# Patient Record
Sex: Female | Born: 1945 | Race: White | Hispanic: No | Marital: Married | Smoking: Never smoker
Health system: Southern US, Community
[De-identification: ages and names within clinical notes are randomized; demographics above are authoritative.]

## PROBLEM LIST (undated history)

## (undated) DIAGNOSIS — I10 Essential (primary) hypertension: Secondary | ICD-10-CM

## (undated) DIAGNOSIS — M4800 Spinal stenosis, site unspecified: Secondary | ICD-10-CM

## (undated) DIAGNOSIS — F329 Major depressive disorder, single episode, unspecified: Secondary | ICD-10-CM

## (undated) DIAGNOSIS — F32A Depression, unspecified: Secondary | ICD-10-CM

## (undated) DIAGNOSIS — R441 Visual hallucinations: Secondary | ICD-10-CM

## (undated) DIAGNOSIS — N390 Urinary tract infection, site not specified: Secondary | ICD-10-CM

## (undated) DIAGNOSIS — G8929 Other chronic pain: Secondary | ICD-10-CM

## (undated) DIAGNOSIS — I5030 Unspecified diastolic (congestive) heart failure: Secondary | ICD-10-CM

## (undated) DIAGNOSIS — E039 Hypothyroidism, unspecified: Secondary | ICD-10-CM

## (undated) DIAGNOSIS — E785 Hyperlipidemia, unspecified: Secondary | ICD-10-CM

## (undated) HISTORY — PX: ABDOMINAL HYSTERECTOMY: SHX81

## (undated) HISTORY — PX: BACK SURGERY: SHX140

## (undated) HISTORY — PX: HEMORROIDECTOMY: SUR656

---

## 1993-09-01 ENCOUNTER — Encounter: Payer: Self-pay | Admitting: Family Medicine

## 1993-09-01 LAB — CONVERTED CEMR LAB: Pap Smear: NORMAL

## 1994-11-13 ENCOUNTER — Encounter: Payer: Self-pay | Admitting: Family Medicine

## 1994-11-13 LAB — CONVERTED CEMR LAB: TSH: 5.15 microintl units/mL

## 1995-03-06 ENCOUNTER — Encounter: Payer: Self-pay | Admitting: Family Medicine

## 1995-03-06 LAB — CONVERTED CEMR LAB: TSH: 4.58 microintl units/mL

## 1995-12-07 ENCOUNTER — Encounter: Payer: Self-pay | Admitting: Family Medicine

## 1995-12-07 LAB — CONVERTED CEMR LAB: TSH: 3.94 microintl units/mL

## 1996-12-15 ENCOUNTER — Encounter: Payer: Self-pay | Admitting: Family Medicine

## 1996-12-15 LAB — CONVERTED CEMR LAB: TSH: 5.21 microintl units/mL

## 1997-07-05 ENCOUNTER — Encounter: Payer: Self-pay | Admitting: Family Medicine

## 1997-07-05 LAB — CONVERTED CEMR LAB: Pap Smear: NORMAL

## 1997-07-06 ENCOUNTER — Encounter: Payer: Self-pay | Admitting: Family Medicine

## 1997-07-06 LAB — CONVERTED CEMR LAB: TSH: 8.19 microintl units/mL

## 1997-08-16 ENCOUNTER — Ambulatory Visit (HOSPITAL_COMMUNITY): Admission: RE | Admit: 1997-08-16 | Discharge: 1997-08-16 | Payer: Self-pay | Admitting: Neurosurgery

## 1997-09-05 ENCOUNTER — Ambulatory Visit (HOSPITAL_COMMUNITY): Admission: RE | Admit: 1997-09-05 | Discharge: 1997-09-05 | Payer: Self-pay | Admitting: Neurosurgery

## 1997-10-05 ENCOUNTER — Encounter: Payer: Self-pay | Admitting: Family Medicine

## 1997-10-05 LAB — CONVERTED CEMR LAB: TSH: 0.62 microintl units/mL

## 1998-03-13 ENCOUNTER — Encounter: Payer: Self-pay | Admitting: Family Medicine

## 1998-03-13 LAB — CONVERTED CEMR LAB: TSH: 0.99 microintl units/mL

## 1999-06-10 ENCOUNTER — Encounter: Admission: RE | Admit: 1999-06-10 | Discharge: 1999-07-01 | Payer: Self-pay | Admitting: Family Medicine

## 2000-06-15 ENCOUNTER — Encounter: Payer: Self-pay | Admitting: Family Medicine

## 2000-06-15 LAB — CONVERTED CEMR LAB: TSH: 7.88 microintl units/mL

## 2001-10-03 ENCOUNTER — Encounter: Payer: Self-pay | Admitting: Family Medicine

## 2001-10-03 LAB — CONVERTED CEMR LAB: TSH: 2.71 microintl units/mL

## 2004-01-01 ENCOUNTER — Encounter: Payer: Self-pay | Admitting: Family Medicine

## 2004-01-01 LAB — CONVERTED CEMR LAB: TSH: 0.1 microintl units/mL

## 2004-06-04 ENCOUNTER — Ambulatory Visit: Payer: Self-pay | Admitting: Family Medicine

## 2004-06-04 LAB — CONVERTED CEMR LAB: TSH: 5.82 microintl units/mL

## 2004-06-09 ENCOUNTER — Ambulatory Visit: Payer: Self-pay | Admitting: Family Medicine

## 2004-07-01 ENCOUNTER — Ambulatory Visit: Payer: Self-pay | Admitting: Family Medicine

## 2004-08-19 ENCOUNTER — Ambulatory Visit: Payer: Self-pay | Admitting: Family Medicine

## 2004-10-02 ENCOUNTER — Ambulatory Visit: Payer: Self-pay | Admitting: Family Medicine

## 2005-02-04 ENCOUNTER — Ambulatory Visit: Payer: Self-pay | Admitting: Family Medicine

## 2006-09-22 ENCOUNTER — Ambulatory Visit: Payer: Self-pay | Admitting: Family Medicine

## 2006-09-22 DIAGNOSIS — L259 Unspecified contact dermatitis, unspecified cause: Secondary | ICD-10-CM | POA: Insufficient documentation

## 2006-09-22 DIAGNOSIS — E039 Hypothyroidism, unspecified: Secondary | ICD-10-CM | POA: Insufficient documentation

## 2006-09-22 DIAGNOSIS — E7800 Pure hypercholesterolemia, unspecified: Secondary | ICD-10-CM | POA: Insufficient documentation

## 2006-09-28 ENCOUNTER — Telehealth (INDEPENDENT_AMBULATORY_CARE_PROVIDER_SITE_OTHER): Payer: Self-pay | Admitting: *Deleted

## 2006-10-06 ENCOUNTER — Telehealth (INDEPENDENT_AMBULATORY_CARE_PROVIDER_SITE_OTHER): Payer: Self-pay | Admitting: *Deleted

## 2006-11-22 ENCOUNTER — Telehealth (INDEPENDENT_AMBULATORY_CARE_PROVIDER_SITE_OTHER): Payer: Self-pay | Admitting: *Deleted

## 2006-12-10 ENCOUNTER — Encounter: Payer: Self-pay | Admitting: Family Medicine

## 2006-12-10 DIAGNOSIS — F411 Generalized anxiety disorder: Secondary | ICD-10-CM | POA: Insufficient documentation

## 2006-12-10 DIAGNOSIS — F329 Major depressive disorder, single episode, unspecified: Secondary | ICD-10-CM | POA: Insufficient documentation

## 2006-12-10 DIAGNOSIS — I10 Essential (primary) hypertension: Secondary | ICD-10-CM | POA: Insufficient documentation

## 2006-12-10 DIAGNOSIS — E785 Hyperlipidemia, unspecified: Secondary | ICD-10-CM | POA: Insufficient documentation

## 2006-12-10 DIAGNOSIS — M1990 Unspecified osteoarthritis, unspecified site: Secondary | ICD-10-CM | POA: Insufficient documentation

## 2006-12-27 ENCOUNTER — Ambulatory Visit: Payer: Self-pay | Admitting: Family Medicine

## 2006-12-27 LAB — CONVERTED CEMR LAB
BUN: 9 mg/dL (ref 6–23)
CO2: 31 meq/L (ref 19–32)
Calcium: 9.1 mg/dL (ref 8.4–10.5)
Chloride: 109 meq/L (ref 96–112)
Creatinine, Ser: 0.7 mg/dL (ref 0.4–1.2)
Free T4: 0.7 ng/dL (ref 0.6–1.6)
GFR calc Af Amer: 110 mL/min
GFR calc non Af Amer: 91 mL/min
Glucose, Bld: 86 mg/dL (ref 70–99)
Potassium: 3.8 meq/L (ref 3.5–5.1)
Sodium: 144 meq/L (ref 135–145)
TSH: 10.03 microintl units/mL — ABNORMAL HIGH (ref 0.35–5.50)

## 2006-12-29 ENCOUNTER — Ambulatory Visit: Payer: Self-pay | Admitting: Family Medicine

## 2007-02-01 ENCOUNTER — Encounter (INDEPENDENT_AMBULATORY_CARE_PROVIDER_SITE_OTHER): Payer: Self-pay | Admitting: *Deleted

## 2007-03-22 ENCOUNTER — Telehealth: Payer: Self-pay | Admitting: Family Medicine

## 2007-08-09 ENCOUNTER — Telehealth: Payer: Self-pay | Admitting: Family Medicine

## 2007-08-15 ENCOUNTER — Telehealth: Payer: Self-pay | Admitting: Family Medicine

## 2007-11-14 ENCOUNTER — Telehealth (INDEPENDENT_AMBULATORY_CARE_PROVIDER_SITE_OTHER): Payer: Self-pay | Admitting: *Deleted

## 2007-11-16 ENCOUNTER — Telehealth: Payer: Self-pay | Admitting: Family Medicine

## 2007-11-17 ENCOUNTER — Encounter (INDEPENDENT_AMBULATORY_CARE_PROVIDER_SITE_OTHER): Payer: Self-pay | Admitting: *Deleted

## 2008-02-02 ENCOUNTER — Telehealth: Payer: Self-pay | Admitting: Family Medicine

## 2008-02-07 ENCOUNTER — Telehealth: Payer: Self-pay | Admitting: Family Medicine

## 2008-03-27 ENCOUNTER — Ambulatory Visit: Payer: Self-pay | Admitting: Family Medicine

## 2008-06-27 ENCOUNTER — Telehealth: Payer: Self-pay | Admitting: Family Medicine

## 2008-08-06 ENCOUNTER — Telehealth: Payer: Self-pay | Admitting: Family Medicine

## 2008-08-07 ENCOUNTER — Ambulatory Visit: Payer: Self-pay | Admitting: Family Medicine

## 2008-09-25 ENCOUNTER — Ambulatory Visit: Payer: Self-pay | Admitting: Family Medicine

## 2008-09-25 DIAGNOSIS — IMO0002 Reserved for concepts with insufficient information to code with codable children: Secondary | ICD-10-CM | POA: Insufficient documentation

## 2008-10-01 ENCOUNTER — Ambulatory Visit (HOSPITAL_COMMUNITY): Admission: RE | Admit: 2008-10-01 | Discharge: 2008-10-01 | Payer: Self-pay | Admitting: Family Medicine

## 2008-10-01 DIAGNOSIS — R1904 Left lower quadrant abdominal swelling, mass and lump: Secondary | ICD-10-CM | POA: Insufficient documentation

## 2008-10-02 ENCOUNTER — Encounter: Payer: Self-pay | Admitting: Family Medicine

## 2008-10-03 ENCOUNTER — Ambulatory Visit (HOSPITAL_COMMUNITY): Admission: RE | Admit: 2008-10-03 | Discharge: 2008-10-03 | Payer: Self-pay | Admitting: Family Medicine

## 2008-10-05 ENCOUNTER — Encounter: Payer: Self-pay | Admitting: Family Medicine

## 2008-10-05 ENCOUNTER — Ambulatory Visit: Admission: RE | Admit: 2008-10-05 | Discharge: 2008-10-05 | Payer: Self-pay | Admitting: Gynecology

## 2008-10-08 ENCOUNTER — Telehealth: Payer: Self-pay | Admitting: Family Medicine

## 2008-10-10 ENCOUNTER — Encounter: Admission: RE | Admit: 2008-10-10 | Discharge: 2008-10-10 | Payer: Self-pay | Admitting: Neurosurgery

## 2008-10-10 ENCOUNTER — Encounter: Payer: Self-pay | Admitting: Family Medicine

## 2008-11-20 ENCOUNTER — Inpatient Hospital Stay (HOSPITAL_COMMUNITY): Admission: RE | Admit: 2008-11-20 | Discharge: 2008-11-22 | Payer: Self-pay | Admitting: Obstetrics & Gynecology

## 2008-11-20 ENCOUNTER — Encounter: Payer: Self-pay | Admitting: Gynecology

## 2008-11-22 ENCOUNTER — Telehealth: Payer: Self-pay | Admitting: Family Medicine

## 2008-11-22 DIAGNOSIS — Z7401 Bed confinement status: Secondary | ICD-10-CM | POA: Insufficient documentation

## 2008-11-27 ENCOUNTER — Telehealth: Payer: Self-pay | Admitting: Family Medicine

## 2008-11-28 ENCOUNTER — Telehealth: Payer: Self-pay | Admitting: Family Medicine

## 2008-12-05 ENCOUNTER — Telehealth: Payer: Self-pay | Admitting: Family Medicine

## 2008-12-10 ENCOUNTER — Encounter: Payer: Self-pay | Admitting: Family Medicine

## 2008-12-24 ENCOUNTER — Telehealth: Payer: Self-pay | Admitting: Family Medicine

## 2009-01-04 ENCOUNTER — Encounter: Payer: Self-pay | Admitting: Family Medicine

## 2009-01-04 ENCOUNTER — Encounter: Payer: Self-pay | Admitting: Gynecology

## 2009-01-04 ENCOUNTER — Ambulatory Visit: Payer: Self-pay | Admitting: Vascular Surgery

## 2009-01-04 ENCOUNTER — Ambulatory Visit: Admission: RE | Admit: 2009-01-04 | Discharge: 2009-01-04 | Payer: Self-pay | Admitting: Gynecology

## 2009-01-04 ENCOUNTER — Telehealth: Payer: Self-pay | Admitting: Family Medicine

## 2009-01-07 ENCOUNTER — Ambulatory Visit: Payer: Self-pay | Admitting: Family Medicine

## 2009-01-07 ENCOUNTER — Telehealth: Payer: Self-pay | Admitting: Family Medicine

## 2009-01-07 DIAGNOSIS — I82409 Acute embolism and thrombosis of unspecified deep veins of unspecified lower extremity: Secondary | ICD-10-CM | POA: Insufficient documentation

## 2009-01-07 DIAGNOSIS — R231 Pallor: Secondary | ICD-10-CM | POA: Insufficient documentation

## 2009-01-07 LAB — CONVERTED CEMR LAB
Basophils Absolute: 0 10*3/uL (ref 0.0–0.1)
Basophils Relative: 0.5 % (ref 0.0–3.0)
Eosinophils Absolute: 0.2 10*3/uL (ref 0.0–0.7)
Eosinophils Relative: 8.6 % — ABNORMAL HIGH (ref 0.0–5.0)
HCT: 26.4 % — ABNORMAL LOW (ref 36.0–46.0)
Hemoglobin: 8.8 g/dL — ABNORMAL LOW (ref 12.0–15.0)
Lymphocytes Relative: 33 % (ref 12.0–46.0)
Lymphs Abs: 0.8 10*3/uL (ref 0.7–4.0)
MCHC: 33.2 g/dL (ref 30.0–36.0)
MCV: 95 fL (ref 78.0–100.0)
Monocytes Absolute: 0.1 10*3/uL (ref 0.1–1.0)
Monocytes Relative: 5.1 % (ref 3.0–12.0)
Neutro Abs: 1.4 10*3/uL (ref 1.4–7.7)
Neutrophils Relative %: 52.8 % (ref 43.0–77.0)
Platelets: 188 10*3/uL (ref 150.0–400.0)
RBC: 2.78 M/uL — ABNORMAL LOW (ref 3.87–5.11)
RDW: 17.3 % — ABNORMAL HIGH (ref 11.5–14.6)
WBC: 2.5 10*3/uL — ABNORMAL LOW (ref 4.5–10.5)

## 2009-01-09 ENCOUNTER — Ambulatory Visit: Payer: Self-pay | Admitting: Family Medicine

## 2009-01-09 LAB — CONVERTED CEMR LAB

## 2009-01-10 LAB — CONVERTED CEMR LAB
INR: 5 — ABNORMAL HIGH (ref 0.8–1.0)
Prothrombin Time: 50.8 s — ABNORMAL HIGH (ref 9.1–11.7)

## 2009-01-11 ENCOUNTER — Ambulatory Visit: Payer: Self-pay | Admitting: Family Medicine

## 2009-01-13 LAB — CONVERTED CEMR LAB
INR: 7.6 (ref 0.8–1.0)
Prothrombin Time: 77.7 s (ref 9.1–11.7)

## 2009-01-14 ENCOUNTER — Ambulatory Visit: Payer: Self-pay | Admitting: Family Medicine

## 2009-01-14 LAB — CONVERTED CEMR LAB
INR: 1.9
Prothrombin Time: 17 s

## 2009-01-16 ENCOUNTER — Telehealth: Payer: Self-pay | Admitting: Family Medicine

## 2009-01-17 ENCOUNTER — Ambulatory Visit: Payer: Self-pay | Admitting: Family Medicine

## 2009-01-21 ENCOUNTER — Ambulatory Visit: Payer: Self-pay | Admitting: Family Medicine

## 2009-01-21 LAB — CONVERTED CEMR LAB
INR: 2.9
Prothrombin Time: 20.7 s

## 2009-01-28 ENCOUNTER — Ambulatory Visit: Payer: Self-pay | Admitting: Family Medicine

## 2009-01-28 LAB — CONVERTED CEMR LAB
INR: 1.5
Prothrombin Time: 15 s

## 2009-02-01 LAB — CONVERTED CEMR LAB: Prothrombin Time: 6.1 s

## 2009-02-04 ENCOUNTER — Ambulatory Visit: Payer: Self-pay | Admitting: Family Medicine

## 2009-02-04 LAB — CONVERTED CEMR LAB
INR: 3.4
Prothrombin Time: 22.3 s

## 2009-02-11 ENCOUNTER — Telehealth: Payer: Self-pay | Admitting: Family Medicine

## 2009-02-18 ENCOUNTER — Ambulatory Visit: Payer: Self-pay | Admitting: Family Medicine

## 2009-02-18 LAB — CONVERTED CEMR LAB
INR: 4
Prothrombin Time: 24.2 s

## 2009-03-11 ENCOUNTER — Ambulatory Visit: Payer: Self-pay | Admitting: Family Medicine

## 2009-03-11 ENCOUNTER — Telehealth: Payer: Self-pay | Admitting: Family Medicine

## 2009-03-11 LAB — CONVERTED CEMR LAB
INR: 1.6
Prothrombin Time: 15.8 s

## 2009-03-20 ENCOUNTER — Ambulatory Visit: Payer: Self-pay | Admitting: Family Medicine

## 2009-03-20 LAB — CONVERTED CEMR LAB
INR: 3.8
Prothrombin Time: 23.6 s

## 2009-04-11 ENCOUNTER — Telehealth: Payer: Self-pay | Admitting: Family Medicine

## 2009-04-11 ENCOUNTER — Encounter: Payer: Self-pay | Admitting: Family Medicine

## 2009-04-22 ENCOUNTER — Ambulatory Visit: Payer: Self-pay | Admitting: Family Medicine

## 2009-04-22 ENCOUNTER — Encounter: Payer: Self-pay | Admitting: Family Medicine

## 2009-04-22 LAB — CONVERTED CEMR LAB
INR: 5.19 (ref <1.50)
Prothrombin Time: 32 s — ABNORMAL HIGH (ref 10.2–14.0)

## 2009-04-24 ENCOUNTER — Ambulatory Visit: Payer: Self-pay | Admitting: Family Medicine

## 2009-04-24 LAB — CONVERTED CEMR LAB
INR: 3.8
Prothrombin Time: 23.6 s

## 2009-04-29 ENCOUNTER — Ambulatory Visit: Payer: Self-pay | Admitting: Family Medicine

## 2009-04-29 LAB — CONVERTED CEMR LAB
INR: 5.3
Prothrombin Time: 27.9 s

## 2009-04-30 LAB — CONVERTED CEMR LAB
INR: 4.9 — ABNORMAL HIGH (ref 0.8–1.0)
Prothrombin Time: 50.4 s — ABNORMAL HIGH (ref 9.1–11.7)

## 2009-05-06 ENCOUNTER — Ambulatory Visit: Payer: Self-pay | Admitting: Family Medicine

## 2009-05-06 LAB — CONVERTED CEMR LAB
INR: 4.7
Prothrombin Time: 26.4 s

## 2009-05-13 ENCOUNTER — Ambulatory Visit: Payer: Self-pay | Admitting: Family Medicine

## 2009-05-13 LAB — CONVERTED CEMR LAB
INR: 5.1
Prothrombin Time: 27.5 s

## 2009-05-21 ENCOUNTER — Ambulatory Visit: Payer: Self-pay | Admitting: Family Medicine

## 2009-06-07 ENCOUNTER — Ambulatory Visit: Payer: Self-pay | Admitting: Family Medicine

## 2009-06-07 LAB — CONVERTED CEMR LAB: INR: 3.7

## 2009-06-17 ENCOUNTER — Telehealth: Payer: Self-pay | Admitting: Family Medicine

## 2009-07-08 ENCOUNTER — Ambulatory Visit: Payer: Self-pay | Admitting: Family Medicine

## 2009-07-08 DIAGNOSIS — K5900 Constipation, unspecified: Secondary | ICD-10-CM | POA: Insufficient documentation

## 2009-07-08 LAB — CONVERTED CEMR LAB: INR: 4.2

## 2009-07-15 ENCOUNTER — Ambulatory Visit: Payer: Self-pay | Admitting: Family Medicine

## 2009-07-15 LAB — CONVERTED CEMR LAB: INR: 3.9

## 2009-07-24 ENCOUNTER — Telehealth: Payer: Self-pay | Admitting: Family Medicine

## 2009-07-29 ENCOUNTER — Ambulatory Visit: Payer: Self-pay | Admitting: Family Medicine

## 2009-07-29 LAB — CONVERTED CEMR LAB
INR: 3.7
Prothrombin Time: 44.1 s

## 2009-08-15 ENCOUNTER — Telehealth: Payer: Self-pay | Admitting: Family Medicine

## 2009-08-17 ENCOUNTER — Emergency Department: Payer: Self-pay | Admitting: Emergency Medicine

## 2009-08-20 ENCOUNTER — Telehealth: Payer: Self-pay | Admitting: Family Medicine

## 2009-08-26 ENCOUNTER — Telehealth: Payer: Self-pay | Admitting: Family Medicine

## 2009-09-02 ENCOUNTER — Ambulatory Visit: Payer: Self-pay | Admitting: Family Medicine

## 2009-09-02 LAB — CONVERTED CEMR LAB
INR: 3.1
Prothrombin Time: 37.3 s

## 2009-09-04 ENCOUNTER — Telehealth: Payer: Self-pay | Admitting: Family Medicine

## 2009-09-04 ENCOUNTER — Emergency Department: Payer: Self-pay | Admitting: Emergency Medicine

## 2009-09-04 DIAGNOSIS — K6289 Other specified diseases of anus and rectum: Secondary | ICD-10-CM | POA: Insufficient documentation

## 2009-09-05 ENCOUNTER — Telehealth: Payer: Self-pay | Admitting: Family Medicine

## 2009-09-10 ENCOUNTER — Encounter: Payer: Self-pay | Admitting: Family Medicine

## 2009-09-13 ENCOUNTER — Encounter: Payer: Self-pay | Admitting: Family Medicine

## 2009-09-23 ENCOUNTER — Ambulatory Visit: Payer: Self-pay | Admitting: General Surgery

## 2009-10-24 ENCOUNTER — Inpatient Hospital Stay: Payer: Self-pay | Admitting: Internal Medicine

## 2009-11-19 ENCOUNTER — Encounter (INDEPENDENT_AMBULATORY_CARE_PROVIDER_SITE_OTHER): Payer: Self-pay | Admitting: *Deleted

## 2010-05-11 LAB — CONVERTED CEMR LAB
INR: 3.6
Prothrombin Time: 23.1 s

## 2010-05-13 NOTE — Progress Notes (Signed)
Summary: medicine not helping rectal pain  Phone Note Call from Patient Call back at Home Phone [phone]   Caller: Spouse Call For: [name] MD Summary of Call: Pt's husband called to say that the medicine pt is using for her rectal pain  is not helping.  What to do next?  Referral?  Prefers to go to [place], since it's closer.   Initial call taken by: Lowella Petties CMA,  Sep 04, 2009 10:25 AM  Follow-up for Phone Call        Would have her seen by surgery, preference is Dr Virgina Jock. Follow-up by: [name] MD,  Sep 04, 2009 10:40 AM  Additional Follow-up for Phone Call Additional follow up Details #1::        Husband advised.  Husband spoke with the compounding pharmacy at [facility] and they recommended the  rectal rocket.  The patient and husband would like to try this to see if it would help before consulting a surgeon.  Are you willing to prescribe it?  Lugene Fuquay CMA Duncan Dull)  Sep 04, 2009 11:23 AM  I really prefer the pt being seen by Dr Lemar Livings and actually establishing the diagnosis of the problem to delineate a trmt plan. [name] MD  Sep 04, 2009 2:02 PM  Husband and son advised of above comment.  They are agreeable to the surgeon referral.  Delilah Shan CMA Duncan Dull)  Sep 04, 2009 2:11 PM   New Problems: RECTAL PAIN [id number])   Additional Follow-up for Phone Call Additional follow up Details #2::    Appt made with Dr Evette Cristal on 09/13/2009 at 1:30pm. Follow-up by: Carlton Adam,  Sep 05, 2009 8:36 AM  New Problems: RECTAL PAIN [phone])

## 2010-05-13 NOTE — Letter (Signed)
Summary: Jessica [name] letter  Jessica [name] at [facility]  [address] [place], [place] [address]   Phone: [phone]  Fax: [phone]       11/19/2009 MRN: [id number]  Jessica [name] [address] [place], [place]  [address]  Dear Jessica [name] [facility] [place] and [place] announce the retirement of [name], M.D., from full-time practice at the [facility] office effective October 10, 2009 and his plans of returning part-time.  It is important to Dr. Hetty Ely and to our practice that you understand that [facility] has seven physicians in our office for your health care needs.  We will continue to offer the same exceptional care that you have today.    Dr. Hetty Ely has spoken to many of you about his plans for retirement and returning part-time in the fall.   We will continue to work with you through the transition to schedule appointments for you in the office and meet the high standards that [facility] is committed to.   Again, it is with great pleasure that we share the news that Dr. Hetty Ely will return to [facility] in October of 2011 with a reduced schedule.    If you have any questions, or would like to request an appointment with one of our physicians, please call us at [phone] and press the option for Scheduling an appointment.  We take pleasure in providing you with excellent patient care and look forward to seeing you at your next office visit.  Our [facility] Physicians are:  Tillman Abide, M.D. Laurita Quint, M.D. Roxy Manns, M.D. Kerby Nora, M.D. Hannah Beat, M.D. Ruthe Mannan, M.D. We proudly welcomed Raechel Ache, M.D. and Eustaquio Boyden, M.D. to the practice in July/August 2011.  Sincerely,  [place] [facility]

## 2010-05-13 NOTE — Assessment & Plan Note (Signed)
Summary: PERSONAL/CLE   Vital Signs:  Patient profile:   65 year old female Temp:     98.4 degrees F oral Pulse rate:   84 / minute Pulse rhythm:   regular BP sitting:   140 / 96  (left arm) Cuff size:   regular  Vitals Entered By: Sydell Axon LPN (July 08, 2009 3:31 PM) CC: Trouble having a BM, constipated   History of Present Illness: Pt here for difficulty with BMs off and on for some time now. She occas needs water enemas with manual disimpaction by her spouse and is very sore and raw in the rectal area. She has throbbing of the rectal area often which keeps her awake at night at times. She is not currently impacted or constipated and declines rectal exam. She otherwise is doing well but having difficult time finding an adequate Medicaid provider.   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 1 By Mouth Daily 2)  Alprazolam 0.5 Mg  Tabs (Alprazolam) .... One Tab By Mouth Two Times A Day As Needed 3)  Flexeril 10 Mg Tabs (Cyclobenzaprine Hcl) .... 1/2-1 Tab By Mouth Three Times A Day As Needed Cramping 4)  Lovenox 80 Mg/0.29ml Soln (Enoxaparin Sodium) 5)   Lovenox 80 Mg/0.43ml Soln (Enoxaparin Sodium) .... 70 Mg Subcutaneously Two Times A Day 6)  Warfarin Sodium 5 Mg Tabs (Warfarin Sodium) .... 2 Tabs Once  A Day For 2 Days Then One A Day 7)  Ambien 10 Mg Tabs (Zolpidem Tartrate) .... 1/2 - 1 Tabe By Mouth At Night As Needed For Insomnia 8)  Proctosol Hc 2.5 % Crea (Hydrocortisone) .... Apply To Rectum Three Times A Day As Needed. 9)  Warfarin Sodium 2 Mg Tabs (Warfarin Sodium) .... One Daily or As Directed  Allergies: 1)  ! * Cephlaxin 2)  ! Amoxicillin  Physical Exam  General:  Thin  female in NAD,  interactive and cooperative....in W/c due to inability to weight bear and walk. Head:  Normocephalic and atraumatic without obvious abnormalities. No apparent alopecia or balding. Eyes:  Conjunctiva clear bilaterally.  Lungs:  Normal respiratory effort, chest expands symmetrically. Lungs are clear to auscultation, no crackles or wheezes. Heart:  Normal rate and regular rhythm. S1 and S2 normal without gallop, murmur, click, rub or other extra sounds. Abdomen:  Bowel sounds positive,abdomen soft and non-tender without masses, organomegaly or hernias noted. Rectal:  Declines. Psych:  Cognition and judgment appear intact. Alert and cooperative with normal attention span and concentration. No apparent delusions, illusions, hallucinations. Much brighter appearing today than previously.   Impression & Recommendations:  Problem # 1:  CONSTIPATION (ICD-564.00) Assessment New  HAs been going on for a few months. Not acutely constipated today.  Described and drew pathophysiology of the vasculature of the GI tract and the idea of hemms. Also discussed peristalsis and ways of stimulating. See instructions.   Discussed dietary fiber measures and increased water intake.   Problem # 2:  DIFFICULTY IN WALKING (ICD-719.7) Assessment: Unchanged hings have not improved since her back surgery. Is getting despondent. Tried to encourage to do  exercises to  retrain the muscles.  Complete Medication List: 1)  Synthroid 88 Mcg Tabs (Levothyroxine sodium) .Marland Kitchen.. 1 by mouth daily 2)  Alprazolam 0.5 Mg Tabs (Alprazolam) .... One tab by mouth two times a day as needed 3)  Flexeril 10 Mg Tabs (Cyclobenzaprine hcl) .... 1/2-1 tab by mouth three times a day as needed cramping 4)  Warfarin Sodium 5 Mg Tabs (Warfarin sodium) .... 2 tabs once  a day for 2 days then one a day 5)  Ambien 10 Mg Tabs (Zolpidem tartrate) .... 1/2 - 1 tabe by mouth at night as needed for insomnia 6)  Proctosol Hc 2.5 % Crea (Hydrocortisone) .... Apply to rectum three times a day as needed. 7)  Warfarin Sodium 2 Mg Tabs (Warfarin sodium) .... One daily or as directed 8)  Hydrocortisone Acetate 25 Mg Supp (Hydrocortisone acetate) .... One supp per rectum three times a day as needed hemm irritation  Patient Instructions: 1)  Start Citrucel 1 tsp in 8 oz of water.  2)  Start Colace, per label.  3)  Hemm cream and supps as needed.  4)  Wash rectal area with soap and water after every BM.Marland Kitchen  Prescriptions: HYDROCORTISONE ACETATE 25 MG SUPP (HYDROCORTISONE ACETATE) one supp per rectum three times a day as needed hemm irritation  #30 x 6   Entered and Authorized by:   [name] MD   Signed by:   [name] MD on 07/08/2009   Method used:   Electronically to        [facility] (retail)       [address]       [place], [place]  [address]       Ph: [phone]       Fax: [phone]   RxID:   [id number] PROCTOSOL HC 2.5 % CREA (HYDROCORTISONE) APPLY TO RECTUM three times a day as needed.  #1 TUBE x 12   Entered and Authorized by:   [name] MD   Signed by:   [name] MD on 07/08/2009   Method used:   Electronically to        [facility] (retail)       [address]       [place], [place]  [address]       Ph: [phone]       Fax: [phone]   RxID:    [id number]   Current Allergies (reviewed today): ! * CEPHLAXIN ! AMOXICILLIN

## 2010-05-13 NOTE — Letter (Signed)
Summary: Dr.Seeplaputhur Sankar,[facility]Note  Dr.Seeplaputhur [facility]Note   Imported By: Beau Fanny 09/16/2009 13:34:23  _____________________________________________________________________  External Attachment:    Type:   Image     Comment:   External Document

## 2010-05-13 NOTE — Progress Notes (Signed)
Summary: wants referral for rectal pain  Phone Note Call from Patient Call back at Home Phone [phone]   Caller: Son Call For: [name] MD Summary of Call: Pt wants referral to specialist for her rectal problems.  She prefers to see someone in [place]. 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 [facility] gave her some foam and suppositories to use. Patient is not any better and is having rectal pain and a throbbing sensation.  Have requested records from [facility]. Follow-up by: Sydell Axon LPN,  Aug 20, 2009 3:15 PM  Additional Follow-up for Phone Call Additional follow up Details #1::        Patient's son came by the office stating that when he  was talking with me earlier they were on the way to [facility] ER because patient is having a lot of rectal pain and needs something done right now.  Additional Follow-up by: Sydell Axon LPN,  Aug 20, 2009 3:46 PM    Additional Follow-up for Phone Call Additional follow up Details #2::    ER should be able to hadle pt being seen. Follow-up by: [name] MD,  Aug 20, 2009 4:51 PM

## 2010-05-13 NOTE — Letter (Signed)
Summary: [facility]  [facility]   Imported By: Beau Fanny 09/11/2009 14:30:37  _____________________________________________________________________  External Attachment:    Type:   Image     Comment:   External Document

## 2010-05-13 NOTE — Progress Notes (Signed)
Summary: Rx Synthroid  Phone Note Refill Request Call back at [phone] Message from:  [facility] on June 17, 2009 8:43 AM  Refills Requested: Medication #1:  SYNTHROID 51 MCG TABS 1 by mouth daily   Last Refilled: 03/11/2009  Method Requested: Electronic Initial call taken by: Sydell Axon LPN,  June 17, 2009 8:45 AM    Prescriptions: SYNTHROID 88 MCG TABS (LEVOTHYROXINE SODIUM) 1 by mouth daily  #30 x 12   Entered and Authorized by:   [name] MD   Signed by:   [name] MD on 06/17/2009   Method used:   Electronically to        [facility] [address] (retail)       [address]       [place], [place]  [address]       Ph: [phone]       Fax: [phone]   RxID:   [id number]

## 2010-05-13 NOTE — Progress Notes (Signed)
Summary: regarding rectal pain  Phone Note Call from Patient   Caller: Patient Call For: [name] MD Summary of Call: Pt's husband called to report that pt is having rectal pain and irritation.  He said she is using her cream and suppositories but they arent helping.  Per Dr. Hetty Ely I advised him to have pt try sitz baths a few times a day- explained to him what that is, and to have pt increase her fiber intake. Pt may need referral to surgeon if this doesnt help. Initial call taken by: Lowella Petties CMA,  Aug 15, 2009 10:18 AM  Follow-up for Phone Call        Thanks. Agree. Follow-up by: [name] MD,  Aug 15, 2009 10:38 AM

## 2010-05-13 NOTE — Progress Notes (Signed)
Summary: wants order for hospital bed  Phone Note Call from Patient Call back at [phone]   Caller: Spouse Summary of Call: Pt's husband is asking if he can have an order for a hospital bed for the patient.  He is having difficulty moving and lifting her.  Order will need to be somewhat detailed. Initial call taken by: Lowella Petties CMA,  July 24, 2009 8:41 AM  Follow-up for Phone Call        Written. Follow-up by: [name] MD,  July 24, 2009 1:52 PM     Appended Document: wants order for hospital bed Pt came in for lab work and I gave the order to her husband.  I have been unable to reach him by phone.

## 2010-05-13 NOTE — Progress Notes (Signed)
  Phone Note Call from Patient   Caller: Son Summary of Call: spoke to patients son. He took her again last nite 09/04/2009 to the [facility] ER, as she was hurting really bad. She was given lidocaine 5% ointment and that seems to be helping alot. Her appt with Dr Evette Cristal is June 3rd at 1:30pm, I urged them to keep this appt. Patients husband did not want the surgical appt but I told her son that she needed to go to this consult or the problem would probably continue, he said he would. Initial call taken by: Carlton Adam,  Sep 05, 2009 8:56 AM  Follow-up for Phone Call        Noted. Follow-up by: [name] MD,  Sep 05, 2009 11:00 AM

## 2010-05-13 NOTE — Progress Notes (Signed)
Summary: Rectal pain  Phone Note Call from Patient   Caller: Spouse Call For: [name] MD Summary of Call: Still having rectal pain, wants pain medication. Re scheduled INR today because of pain. Initial call taken by: Mills Koller,  Aug 26, 2009 3:12 PM  Follow-up for Phone Call        Please get both sets of ER records from being seen, once when diagnosed with fissure and once when here on way to ER. Follow-up by: [name] MD,  Aug 26, 2009 3:33 PM  Additional Follow-up for Phone Call Additional follow up Details #1::        Requested ER records from [facility]. Could not find ER records from [facility] for last week. Called and was informed by patient's husband that after they came by here stating that they were on the way to take the patient to the hospital  they decided not to go and just went back home. [facility] ER records are on your desk. Additional Follow-up by: Sydell Axon LPN,  Aug 26, 2009 3:54 PM    Additional Follow-up for Phone Call Additional follow up Details #2::    Looks to me like she didn't actually have anoscopy or any other exam to "visualize " the rectal canal but had "Pain on exam, ?fissure?"  Will presumptively try Diltiazem Cream to be applied to help with discomfort and healing. Script written.  Pt needs to continue with something (Colace or fiber) to kep the BM soft.  Pain medicine will worsen things as it will tend to constipate her.  [name] MD  Aug 26, 2009 4:59 PM   Patient's husband notified as instructed by telephone. Rx left up front for him to pickup and take to the pharmacy. Follow-up by: Sydell Axon LPN,  Aug 26, 2009 5:12 PM  New/Updated Medications: DILTIAZEM HCL CR 60 MG XR12H-CAP (DILTIAZEM HCL)

## 2010-07-19 LAB — TYPE AND SCREEN
ABO/RH(D): A NEG
Antibody Screen: NEGATIVE

## 2010-07-19 LAB — COMPREHENSIVE METABOLIC PANEL
ALT: 10 U/L (ref 0–35)
AST: 14 U/L (ref 0–37)
Albumin: 3.3 g/dL — ABNORMAL LOW (ref 3.5–5.2)
Alkaline Phosphatase: 75 U/L (ref 39–117)
BUN: 8 mg/dL (ref 6–23)
CO2: 29 mEq/L (ref 19–32)
Calcium: 9.2 mg/dL (ref 8.4–10.5)
Chloride: 108 mEq/L (ref 96–112)
Creatinine, Ser: 0.69 mg/dL (ref 0.4–1.2)
GFR calc Af Amer: >60 mL/min (ref >60)
GFR calc non Af Amer: >60 mL/min (ref >60)
Glucose, Bld: 97 mg/dL (ref 70–99)
Potassium: 3.8 mEq/L (ref 3.5–5.1)
Sodium: 143 mEq/L (ref 135–145)
Total Bilirubin: 0.5 mg/dL (ref 0.3–1.2)
Total Protein: 6.8 g/dL (ref 6.0–8.3)

## 2010-07-19 LAB — DIFFERENTIAL
Basophils Absolute: 0 10*3/uL (ref 0.0–0.1)
Basophils Relative: 0 % (ref 0–1)
Eosinophils Absolute: 0.1 10*3/uL (ref 0.0–0.7)
Eosinophils Relative: 3 % (ref 0–5)
Lymphocytes Relative: 26 % (ref 12–46)
Lymphs Abs: 1.4 10*3/uL (ref 0.7–4.0)
Monocytes Absolute: 0.3 10*3/uL (ref 0.1–1.0)
Monocytes Relative: 6 % (ref 3–12)
Neutro Abs: 3.4 10*3/uL (ref 1.7–7.7)
Neutrophils Relative %: 65 % (ref 43–77)

## 2010-07-19 LAB — URINALYSIS, ROUTINE W REFLEX MICROSCOPIC
Bilirubin Urine: NEGATIVE
Glucose, UA: NEGATIVE mg/dL
Hgb urine dipstick: NEGATIVE
Ketones, ur: NEGATIVE mg/dL
Nitrite: NEGATIVE
Protein, ur: NEGATIVE mg/dL
Specific Gravity, Urine: 1.004 — ABNORMAL LOW (ref 1.005–1.030)
Urobilinogen, UA: 0.2 mg/dL (ref 0.0–1.0)
pH: 7 (ref 5.0–8.0)

## 2010-07-19 LAB — CBC
HCT: 26.8 % — ABNORMAL LOW (ref 36.0–46.0)
HCT: 29.4 % — ABNORMAL LOW (ref 36.0–46.0)
Hemoglobin: 9 g/dL — ABNORMAL LOW (ref 12.0–15.0)
Hemoglobin: 9.9 g/dL — ABNORMAL LOW (ref 12.0–15.0)
MCHC: 33.7 g/dL (ref 30.0–36.0)
MCHC: 33.8 g/dL (ref 30.0–36.0)
MCV: 108.6 fL — ABNORMAL HIGH (ref 78.0–100.0)
MCV: 108.8 fL — ABNORMAL HIGH (ref 78.0–100.0)
Platelets: 197 10*3/uL (ref 150–400)
Platelets: 318 10*3/uL (ref 150–400)
RBC: 2.47 MIL/uL — ABNORMAL LOW (ref 3.87–5.11)
RBC: 2.7 MIL/uL — ABNORMAL LOW (ref 3.87–5.11)
RDW: 14.3 % (ref 11.5–15.5)
RDW: 14.5 % (ref 11.5–15.5)
WBC: 10 10*3/uL (ref 4.0–10.5)
WBC: 5.2 10*3/uL (ref 4.0–10.5)

## 2010-07-19 LAB — BASIC METABOLIC PANEL
BUN: 7 mg/dL (ref 6–23)
CO2: 27 mEq/L (ref 19–32)
Calcium: 8.8 mg/dL (ref 8.4–10.5)
Chloride: 108 mEq/L (ref 96–112)
Creatinine, Ser: 0.63 mg/dL (ref 0.4–1.2)
GFR calc Af Amer: >60 mL/min (ref >60)
GFR calc non Af Amer: >60 mL/min (ref >60)
Glucose, Bld: 113 mg/dL — ABNORMAL HIGH (ref 70–99)
Potassium: 4.2 mEq/L (ref 3.5–5.1)
Sodium: 140 mEq/L (ref 135–145)

## 2010-07-19 LAB — CA 125: CA 125: 8.9 U/mL (ref 0.0–30.2)

## 2010-07-19 LAB — URINE CULTURE
Colony Count: NO GROWTH
Culture: NO GROWTH
Special Requests: NEGATIVE

## 2010-07-19 LAB — ABO/RH: ABO/RH(D): A NEG

## 2010-08-26 NOTE — Consult Note (Signed)
[name], [name]             ACCOUNT NO.:  [id number]   MEDICAL RECORD NO.:  [id number]          PATIENT TYPE:  OUT   LOCATION:  GYN                          FACILITY:  [facility]   PHYSICIAN:  De Blanch, M.D.DATE OF BIRTH:  1945/09/05   DATE OF CONSULTATION:  10/04/2008  DATE OF DISCHARGE:  10/05/2008                                 CONSULTATION   CHIEF COMPLAINT:  Pelvic mass, lower extremity weakness.   HISTORY OF PRESENT ILLNESS:  A 65 year old white female seen in  consultation at the request of Dr. Hetty Ely regarding newly diagnosed  pelvic mass.  The patient was undergoing evaluation for chronic numbness  and tingling in the lower extremities.  As part of that workup she  underwent a MRI of the lumbar spine which showed a partially visualized  large septated cystic mass in the pelvis.  In addition, spondylosis of  the lumbar spine was detailed, mostly notably affecting L4-5, resulting  in significant narrowing.  A subsequent pelvic ultrasound to further  typify the mass was performed and revealed an 11 x 7 x 7.3 cm mass with  either septations or abutting cysts.  There is no solid or mass-like  components noted and no free fluid.  The patient denies any pelvic  symptoms.   She has previously had a hysterectomy for uterine fibroids.   PAST MEDICAL HISTORY/MEDICAL ILLNESSES:  Lumbar radiculopathy,  osteoarthritis, hypertension, hyperlipidemia, depression, possible  sarcoidosis, eczema, peripheral neuropathy, residual back neuropathy,  and hypothyroidism.   CURRENT MEDICATIONS:  Synthroid, Prozac, alprazolam.   ALLERGIES:  CEPHALEXIN, AMOXICILLIN.   FAMILY HISTORY:  Negative for gynecologic breast or colon cancer.   PAST SURGICAL HISTORY:  TAH and 1982, hemorrhoidectomy, prior back  surgery by Dr. Dutch Quint.   SOCIAL HISTORY:  The the patient is married.  She comes accompanied by  her husband and son today.  She does not smoke.   PHYSICAL EXAMINATION:  VITAL  SIGNS:  Height 5 feet 10 (the patient's  history), weight 140 pounds (the patient's history).  The patient is  unable to stand to be measured.  Blood pressure 128/68, pulse 70,  respiratory rate 20.  GENERAL:  The patient is a slender white female who is unable to stand  in complains of numbness up to her waist.  HEENT:  Negative.  NECK:  Supple without thyromegaly.  There is no supraclavicular or  inguinal adenopathy.  ABDOMEN:  Soft, nontender.  No mass, organomegaly, ascites or hernias  are noted.  PELVIC:  EG BUS, vagina and urethra are normal.  Cervix and uterus are  surgically absent. On bimanual examination there is a smooth cystic mass  filling the pelvis.  There is no nodularity and no pain associated with  palpation.  Rectovaginal exam confirms.   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   My primary concern at this juncture is the patient's neurologic status,  as according to the history by the patient and the husband, this is  progressing.  At this juncture I believe management of her neuropathy is  a much higher  priority than her cystic pelvic mass, which given the fact  that it is asymptomatic, could be followed safely with serial  ultrasounds.  We will return the patient to Dr. Hetty Ely and to have her  undertake further neurological evaluation and management.  We will plan  on the patient have a repeat ultrasound in approximately 3 months to  reassess the cyst.      De Blanch, M.D.  Electronically Signed     DC/MEDQ  D:  10/10/2008  T:  10/10/2008  Job:  [id number]   cc:   [name], MD  Fax: [phone]   Telford Nab, R.N.  [phone] [address]  [place], [place] [address]   Kathaleen Maser. Pool, M.D.  Fax: [phone]

## 2010-08-26 NOTE — H&P (Signed)
Jessica [name], Jessica [name]             ACCOUNT NO.:  [id number]   MEDICAL RECORD NO.:  [id number]          PATIENT TYPE:  INP   LOCATION:  0008                         [facility]ACILITY[facility]  [facility]   PHYSICIAN:  De Blanch, M.D.DATE OF BIRTH:  [birth year]   DATE OF ADMISSION:  11/20/2008  DATE OF DISCHARGE:                              HISTORY & PHYSICAL   CHIEF COMPLAINT:  Pelvic mass.   HISTORY OF PRESENT ILLNESS:  A 65 year old white female referred by Dr.  Hetty Ely for management of a  newly diagnosed pelvic mass.  The mass was  discovered at the time of a workup for chronic numbness and tingling in  her lower extremities, in that an MRI was performed demonstrating an 11  x 7 x 7.3 cm mass.  The patient does have spondylosis and some  degenerative disk disease.  The mass itself is not causing any  significant problems.  She denies any pelvic pain or pressure, or any GI  or GU symptoms.  The patient has previously had a hysterectomy for  uterine fibroids.   The patient does have bilateral lower extremity weakness, which on  initial evaluation was unclear.  Subsequently she has been evaluated by  Dr. Julio Sicks, who recommended proceeding with resection of the pelvic  masses.  He believes her lower extremity symptoms are secondary to  lumbosacral plexus compression by the mass.   PAST MEDICAL HISTORY/MEDICAL ILLNESSES:  1. Lumbar radiculopathy.  2. Osteoarthritis.  3. Hypertension.  4. Hyperlipidemia.  5. Depression.  6. Possible sarcoidosis.  7. Eczema.  8. Peripheral neuropathy.  9. Residual back neuropathy.  10.Hypothyroidism.   CURRENT MEDICATIONS:  1. Synthroid.  2. Prozac.  3. Alprazolam.   DRUG ALLERGIES:  CEPHALEXIN and AMOXICILLIN.   FAMILY HISTORY:  Negative for gynecologic breast or colon cancer.   PAST SURGICAL HISTORY:  1. TAH in 1982.  2. Hemorrhoidectomy.  3. Prior back surgery.   SOCIAL HISTORY:  The patient is married.  She does not smoke.   PHYSICAL EXAMINATION:  Height 5 feet 10 inches, weight 140 pounds (this  is by the patient's history).  She is unable to stand to be measured or  weighed.  Blood pressure 128/68, pulse 70, respiratory rate 20.  GENERAL:  The patient is a slender white female in no acute distress.  HEENT:  Negative.  NECK: Supple without thyromegaly.  There is no supraclavicular or  inguinal adenopathy.  ABDOMEN:  Soft, nontender.  No mass, organomegaly, ascites or hernias  are noted.  PELVIC:  EGBUS.  Vagina, bladder and urethra are normal.  Cervix and uterus were surgically absent.  Bimanual exam reveals a  smooth cystic mass filling the pelvis.  There is no nodularity or pain  to palpation of the mass.  Rectovaginal exam confirms.   IMPRESSION:  A septated pelvic mass, which I believe is most likely a  benign process.  The pros and cons of surgery have been discussed with  the patient.  She wished to proceed with surgery, with the understanding  that surgery may not help her neurologic symptoms.   The risks of surgery, including hemorrhage,  infection, injury to  adjacent viscera and thrombolic complications were outlined.  All  questions were answered.  We will proceed with surgery on November 20, 2008.      De Blanch, M.D.  Electronically Signed     DC/MEDQ  D:  11/20/2008  T:  11/20/2008  Job:  [id number]   cc:   [name], MD  Fax: [phone]   Telford Nab, R.N.  [phone]  [place], [place] [address]

## 2010-08-26 NOTE — Op Note (Signed)
Jessica [name], Jessica [name]             ACCOUNT NO.:  [id number]   MEDICAL RECORD NO.:  [id number]          PATIENT TYPE:  INP   LOCATION:  0008                         FACILITY:  [facility]   PHYSICIAN:  De Blanch, M.D.DATE OF BIRTH:  11/09/1945   DATE OF PROCEDURE:  11/20/2008  DATE OF DISCHARGE:                               OPERATIVE REPORT   PREOPERATIVE DIAGNOSIS:  Complex pelvic mass.   POSTOPERATIVE DIAGNOSIS:  Bilateral serous cystadenofibromas.   PROCEDURE:  Exploratory laparotomy, bilateral salpingo-oophorectomy.   SURGEON:  De Blanch, M.D.   ASSISTANT:  Roseanna Rainbow, M.D. and Telford Nab, R.N.   ANESTHESIA:  General with orotracheal tube.   ESTIMATED BLOOD LOSS:  550 mL.   SURGICAL FINDINGS:  At the time of exploratory laparotomy, the patient  had complex cyst arising from both ovaries.  The left ovary was  approximately 7-8 cm in diameter, where as the right ovary was  multicystic, measuring in aggregate of approximately 5 cm in diameter.  Exploration of the upper abdomen including liver, diaphragm, stomach and  omentum were all normal.   PROCEDURE:  The patient was brought to the operating room and after  satisfactory attainment of general anesthesia she was placed in modified  lithotomy position in [place] stirrups.  The anterior abdominal wall,  perineum and vagina were prepped with Betadine.  A Foley catheter was  inserted.  The patient was draped.  The abdomen was entered through a  midline incision.  Peritoneal washings were initially obtained, although  after pathology returned with this being a benign lesion, the washings  were discarded.  The upper abdomen and pelvis were explored with the  above-noted findings.  Bookwalter retractor was positioned and the small  bowel was packed out of pelvis.  Adhesions of the sigmoid colon to the  left pelvic sidewall were lysed.  The left round ligament was grasped  and divided  retroperitoneal and the retroperitoneal space opened,  identifying the vessels and ureter.  The ovarian vessels were  skeletonized, clamped, cut, free tied and suture ligated.  The cyst was  attached to the left vaginal angle which was cross clamped, divided and  the left tube and ovary were removed from the pelvis and submitted to  frozen section.  The vaginal angle clamp was suture ligated with 2-0  Vicryl suture.   Attention was turned to the right side of the pelvis where the right  pelvic sidewall was opened.  I again identified the sidewall vessels and  ureter.  The ovarian vessels were skeletonized, clamped, cut free tied  and suture ligated.  This ovarian cyst was densely adherent to the right  pelvic sidewall.  Careful retroperitoneal dissection was undertaken to  mobilize the peritoneal attachments of the cyst so as to remove the  entire ovary.  These were excised.  The vaginal angle was once again  adherent to the cyst.  This was cross clamped, divided and suture  ligated.  The right ovarian cyst and fallopian tube were submitted for  frozen section, both of which returned as benign.  The pelvis was  irrigated.  Hemostasis achieved with  some additional cautery.  The  laparotomy packs and retractors were removed.  The anterior abdominal  wall was closed in layers.  The first being a running mass closure using  #1 PDS.  The  subcutaneous tissue was irrigated.  Hemostasis achieved with cautery and  the skin closed with skin staples.  A dressing was applied.  The patient  was awakened from anesthesia and taken to the recovery room in  satisfactory condition.  Sponge, needle and instrument counts correct  x2.      De Blanch, M.D.  Electronically Signed     DC/MEDQ  D:  11/20/2008  T:  11/20/2008  Job:  [id number]   cc:   Telford Nab, R.N.  [address]  [place], [place] [address]   Roseanna Rainbow, M.D.  Fax: [phone]   [name], MD   Fax: [phone]   Kathaleen Maser. Pool, M.D.  Fax: [phone]

## 2010-08-27 ENCOUNTER — Ambulatory Visit: Payer: Self-pay | Admitting: Neurology

## 2010-08-29 NOTE — Discharge Summary (Signed)
Jessica [name], Jessica [name]             ACCOUNT NO.:  [id number]   MEDICAL RECORD NO.:  [id number]          PATIENT TYPE:  INP   LOCATION:  1532                         FACILITY:  [facility]   PHYSICIAN:  Roseanna Rainbow, M.D.DATE OF BIRTH:  1945/11/25   DATE OF ADMISSION:  11/20/2008  DATE OF DISCHARGE:  11/22/2008                               DISCHARGE SUMMARY   CHIEF COMPLAINT:  The patient is a 65 year old with a pelvic mass who  presents for operative management.  Please see the dictated history and  physical as per Dr. Emmaline Kluver for further details.   HOSPITAL COURSE:  The patient was admitted and underwent an exploratory  laparotomy and bilateral salpingo-oophorectomy.  Please see the dictated  operative summary.  On postoperative day #1, a basic metabolic profile  was normal and a hemoglobin was 9.  A preoperative hemoglobin was 9.9.  The patient had preoperatively, lower extremity weakness.  In light of  this, physical therapy consult was obtained.  The remainder of her  hospital course was uneventful.  Her diet was advanced.  She was  discharged to home on postoperative day #2 tolerating a regular diet.   DISCHARGE DIAGNOSIS:  Bilateral ovarian serous cyst adenoma fibromas   PROCEDURE:  Exploratory laparotomy and bilateral salpingo-oophorectomy.   CONDITION:  Stable.   DISCHARGE INSTRUCTIONS:  1. Diet.  Regular.  2. Activity.  Progressive activity, pelvic rest.   MEDICATIONS:  1. Percocet 1-2 tablets every 6 hours as needed.  2. Synthroid.  3. Prozac.  4. Alprazolam.   DISPOSITION:  The patient was to follow up in the GYN oncology office  for staple removal.  A neurology consult was to be obtained as well on  an outpatient basis for further workup of her neurologic symptoms.      Roseanna Rainbow, M.D.  Electronically Signed     LAJ/MEDQ  D:  11/23/2008  T:  11/23/2008  Job:  [id number]   cc:   Telford Nab, R.N.  [address]  [place], [place] [address]   [name], MD  Fax: [phone]

## 2010-12-31 ENCOUNTER — Ambulatory Visit: Payer: Self-pay | Admitting: Family

## 2011-01-07 ENCOUNTER — Ambulatory Visit: Payer: Self-pay | Admitting: Family

## 2016-04-13 DIAGNOSIS — J90 Pleural effusion, not elsewhere classified: Secondary | ICD-10-CM | POA: Diagnosis present

## 2016-04-27 DIAGNOSIS — J9811 Atelectasis: Secondary | ICD-10-CM | POA: Diagnosis present

## 2016-07-06 ENCOUNTER — Emergency Department: Payer: Medicare (Managed Care)

## 2016-07-06 ENCOUNTER — Observation Stay
Admission: EM | Admit: 2016-07-06 | Discharge: 2016-07-07 | Disposition: A | Discharge status: Home / Self Care | Payer: Medicare (Managed Care) | Attending: Specialist | Admitting: Specialist

## 2016-07-06 ENCOUNTER — Observation Stay (HOSPITAL_BASED_OUTPATIENT_CLINIC_OR_DEPARTMENT_OTHER)
Admit: 2016-07-06 | Discharge: 2016-07-06 | Disposition: A | Discharge status: Home / Self Care | Payer: Medicare (Managed Care) | Attending: Specialist | Admitting: Specialist

## 2016-07-06 ENCOUNTER — Encounter: Payer: Self-pay | Admitting: Emergency Medicine

## 2016-07-06 DIAGNOSIS — F17290 Nicotine dependence, other tobacco product, uncomplicated: Secondary | ICD-10-CM | POA: Insufficient documentation

## 2016-07-06 DIAGNOSIS — F419 Anxiety disorder, unspecified: Secondary | ICD-10-CM | POA: Insufficient documentation

## 2016-07-06 DIAGNOSIS — I471 Supraventricular tachycardia: Secondary | ICD-10-CM | POA: Diagnosis not present

## 2016-07-06 DIAGNOSIS — G8220 Paraplegia, unspecified: Secondary | ICD-10-CM | POA: Diagnosis not present

## 2016-07-06 DIAGNOSIS — G629 Polyneuropathy, unspecified: Secondary | ICD-10-CM | POA: Insufficient documentation

## 2016-07-06 DIAGNOSIS — I10 Essential (primary) hypertension: Secondary | ICD-10-CM | POA: Diagnosis not present

## 2016-07-06 DIAGNOSIS — R000 Tachycardia, unspecified: Secondary | ICD-10-CM | POA: Diagnosis not present

## 2016-07-06 DIAGNOSIS — Z888 Allergy status to other drugs, medicaments and biological substances status: Secondary | ICD-10-CM | POA: Insufficient documentation

## 2016-07-06 DIAGNOSIS — Z7982 Long term (current) use of aspirin: Secondary | ICD-10-CM | POA: Diagnosis not present

## 2016-07-06 DIAGNOSIS — F329 Major depressive disorder, single episode, unspecified: Secondary | ICD-10-CM | POA: Insufficient documentation

## 2016-07-06 DIAGNOSIS — R443 Hallucinations, unspecified: Secondary | ICD-10-CM | POA: Diagnosis present

## 2016-07-06 DIAGNOSIS — M1990 Unspecified osteoarthritis, unspecified site: Secondary | ICD-10-CM | POA: Insufficient documentation

## 2016-07-06 DIAGNOSIS — Z880 Allergy status to penicillin: Secondary | ICD-10-CM | POA: Diagnosis not present

## 2016-07-06 DIAGNOSIS — Z7401 Bed confinement status: Secondary | ICD-10-CM | POA: Diagnosis not present

## 2016-07-06 DIAGNOSIS — G8929 Other chronic pain: Secondary | ICD-10-CM | POA: Diagnosis not present

## 2016-07-06 DIAGNOSIS — E876 Hypokalemia: Secondary | ICD-10-CM

## 2016-07-06 DIAGNOSIS — Z87440 Personal history of urinary (tract) infections: Secondary | ICD-10-CM | POA: Diagnosis not present

## 2016-07-06 DIAGNOSIS — E7800 Pure hypercholesterolemia, unspecified: Secondary | ICD-10-CM | POA: Insufficient documentation

## 2016-07-06 DIAGNOSIS — R441 Visual hallucinations: Principal | ICD-10-CM | POA: Insufficient documentation

## 2016-07-06 DIAGNOSIS — E785 Hyperlipidemia, unspecified: Secondary | ICD-10-CM | POA: Diagnosis not present

## 2016-07-06 DIAGNOSIS — E039 Hypothyroidism, unspecified: Secondary | ICD-10-CM | POA: Insufficient documentation

## 2016-07-06 HISTORY — DX: Hypothyroidism, unspecified: E03.9

## 2016-07-06 HISTORY — DX: Spinal stenosis, site unspecified: M48.00

## 2016-07-06 HISTORY — DX: Visual hallucinations: R44.1

## 2016-07-06 HISTORY — DX: Essential (primary) hypertension: I10

## 2016-07-06 HISTORY — DX: Morbid (severe) obesity due to excess calories: E66.01

## 2016-07-06 HISTORY — DX: Hyperlipidemia, unspecified: E78.5

## 2016-07-06 HISTORY — DX: Depression, unspecified: F32.A

## 2016-07-06 HISTORY — DX: Urinary tract infection, site not specified: N39.0

## 2016-07-06 HISTORY — DX: Major depressive disorder, single episode, unspecified: F32.9

## 2016-07-06 HISTORY — DX: Other chronic pain: G89.29

## 2016-07-06 LAB — URINALYSIS, COMPLETE (UACMP) WITH MICROSCOPIC
Bilirubin Urine: NEGATIVE
Glucose, UA: NEGATIVE mg/dL
Hgb urine dipstick: NEGATIVE
Ketones, ur: NEGATIVE mg/dL
Nitrite: NEGATIVE
Protein, ur: NEGATIVE mg/dL
Specific Gravity, Urine: 1.017 (ref 1.005–1.030)
pH: 6 (ref 5.0–8.0)

## 2016-07-06 LAB — COMPREHENSIVE METABOLIC PANEL
ALT: 26 U/L (ref 14–54)
AST: 44 U/L — ABNORMAL HIGH (ref 15–41)
Albumin: 3.4 g/dL — ABNORMAL LOW (ref 3.5–5.0)
Alkaline Phosphatase: 119 U/L (ref 38–126)
Anion gap: 9 (ref 5–15)
BUN: 8 mg/dL (ref 6–20)
CO2: 29 mmol/L (ref 22–32)
Calcium: 9.5 mg/dL (ref 8.9–10.3)
Chloride: 98 mmol/L — ABNORMAL LOW (ref 101–111)
Creatinine, Ser: 0.59 mg/dL (ref 0.44–1.00)
GFR calc Af Amer: >60 mL/min (ref >60)
GFR calc non Af Amer: >60 mL/min (ref >60)
Glucose, Bld: 129 mg/dL — ABNORMAL HIGH (ref 65–99)
Potassium: 3.2 mmol/L — ABNORMAL LOW (ref 3.5–5.1)
Sodium: 136 mmol/L (ref 135–145)
Total Bilirubin: 0.8 mg/dL (ref 0.3–1.2)
Total Protein: 7.6 g/dL (ref 6.5–8.1)

## 2016-07-06 LAB — CBC WITH DIFFERENTIAL/PLATELET
Basophils Absolute: 0.1 10*3/uL (ref 0–0.1)
Basophils Relative: 1 %
Eosinophils Absolute: 0 10*3/uL (ref 0–0.7)
Eosinophils Relative: 0 %
HCT: 44.9 % (ref 35.0–47.0)
Hemoglobin: 15.3 g/dL (ref 12.0–16.0)
Lymphocytes Relative: 17 %
Lymphs Abs: 1.6 10*3/uL (ref 1.0–3.6)
MCH: 26.6 pg (ref 26.0–34.0)
MCHC: 34 g/dL (ref 32.0–36.0)
MCV: 78.1 fL — ABNORMAL LOW (ref 80.0–100.0)
Monocytes Absolute: 0.6 10*3/uL (ref 0.2–0.9)
Monocytes Relative: 6 %
Neutro Abs: 7 10*3/uL — ABNORMAL HIGH (ref 1.4–6.5)
Neutrophils Relative %: 76 %
Platelets: 297 10*3/uL (ref 150–440)
RBC: 5.75 MIL/uL — ABNORMAL HIGH (ref 3.80–5.20)
RDW: 14 % (ref 11.5–14.5)
WBC: 9.3 10*3/uL (ref 3.6–11.0)

## 2016-07-06 LAB — TSH: TSH: 0.802 u[IU]/mL (ref 0.350–4.500)

## 2016-07-06 LAB — TROPONIN I: Troponin I: <0.03 ng/mL (ref <0.03)

## 2016-07-06 LAB — POTASSIUM: Potassium: 5.8 mmol/L — ABNORMAL HIGH (ref 3.5–5.1)

## 2016-07-06 MED ORDER — ONDANSETRON HCL 4 MG/2ML IJ SOLN: 4.0000 mg | Freq: Four times a day (QID) | INTRAMUSCULAR | Status: DC | PRN

## 2016-07-06 MED ORDER — ENOXAPARIN SODIUM 40 MG/0.4ML ~~LOC~~ SOLN
40.0000 mg | SUBCUTANEOUS | Status: DC
  Administered 2016-07-06: 40 mg via SUBCUTANEOUS
  Filled 2016-07-06: qty 0.4

## 2016-07-06 MED ORDER — POTASSIUM CHLORIDE CRYS ER 10 MEQ PO TBCR
10.0000 meq | EXTENDED_RELEASE_TABLET | Freq: Every day | ORAL | Status: DC
  Administered 2016-07-06 – 2016-07-07 (×2): 10 meq via ORAL
  Filled 2016-07-06 (×2): qty 1

## 2016-07-06 MED ORDER — ACETAMINOPHEN 650 MG RE SUPP: 650.0000 mg | Freq: Four times a day (QID) | RECTAL | Status: DC | PRN

## 2016-07-06 MED ORDER — LEVOTHYROXINE SODIUM 150 MCG PO TABS
150.0000 ug | ORAL_TABLET | Freq: Every day | ORAL | Status: DC
  Administered 2016-07-07: 150 ug via ORAL
  Filled 2016-07-06 (×2): qty 1

## 2016-07-06 MED ORDER — HYDROXYZINE HCL 25 MG PO TABS: 25.0000 mg | ORAL_TABLET | Freq: Two times a day (BID) | ORAL | Status: DC | PRN

## 2016-07-06 MED ORDER — DOCUSATE SODIUM 100 MG PO CAPS
100.0000 mg | ORAL_CAPSULE | Freq: Two times a day (BID) | ORAL | Status: DC
  Administered 2016-07-06 – 2016-07-07 (×3): 100 mg via ORAL
  Filled 2016-07-06 (×3): qty 1

## 2016-07-06 MED ORDER — POTASSIUM CHLORIDE CRYS ER 20 MEQ PO TBCR: 40.0000 meq | EXTENDED_RELEASE_TABLET | Freq: Once | ORAL | Status: DC

## 2016-07-06 MED ORDER — VENLAFAXINE HCL ER 75 MG PO CP24
225.0000 mg | ORAL_CAPSULE | Freq: Every day | ORAL | Status: DC
  Administered 2016-07-06 – 2016-07-07 (×2): 225 mg via ORAL
  Filled 2016-07-06 (×3): qty 3

## 2016-07-06 MED ORDER — FUROSEMIDE 20 MG PO TABS
20.0000 mg | ORAL_TABLET | Freq: Every day | ORAL | Status: DC
Start: 2016-07-06 — End: 2016-07-07
  Administered 2016-07-06 – 2016-07-07 (×2): 20 mg via ORAL
  Filled 2016-07-06 (×2): qty 1

## 2016-07-06 MED ORDER — ASPIRIN EC 81 MG PO TBEC
81.0000 mg | DELAYED_RELEASE_TABLET | Freq: Every day | ORAL | Status: DC
  Administered 2016-07-06 – 2016-07-07 (×2): 81 mg via ORAL
  Filled 2016-07-06 (×2): qty 1

## 2016-07-06 MED ORDER — TRAZODONE HCL 100 MG PO TABS
100.0000 mg | ORAL_TABLET | Freq: Every day | ORAL | Status: DC
  Administered 2016-07-06: 100 mg via ORAL
  Filled 2016-07-06: qty 1

## 2016-07-06 MED ORDER — POTASSIUM CHLORIDE CRYS ER 20 MEQ PO TBCR
20.0000 meq | EXTENDED_RELEASE_TABLET | Freq: Every day | ORAL | Status: DC
  Administered 2016-07-06 – 2016-07-07 (×2): 20 meq via ORAL
  Filled 2016-07-06 (×2): qty 1

## 2016-07-06 MED ORDER — SODIUM CHLORIDE 0.9% FLUSH
3.0000 mL | Freq: Two times a day (BID) | INTRAVENOUS | Status: DC
  Administered 2016-07-06 – 2016-07-07 (×3): 3 mL via INTRAVENOUS

## 2016-07-06 MED ORDER — AMLODIPINE-ATORVASTATIN 2.5-10 MG PO TABS
1.0000 | ORAL_TABLET | Freq: Every day | ORAL | Status: DC
Start: 2016-07-06 — End: 2016-07-06

## 2016-07-06 MED ORDER — ONDANSETRON HCL 4 MG PO TABS: 4.0000 mg | ORAL_TABLET | Freq: Four times a day (QID) | ORAL | Status: DC | PRN

## 2016-07-06 MED ORDER — PREGABALIN 50 MG PO CAPS
150.0000 mg | ORAL_CAPSULE | Freq: Three times a day (TID) | ORAL | Status: DC
Start: 2016-07-06 — End: 2016-07-07
  Administered 2016-07-06 – 2016-07-07 (×5): 150 mg via ORAL
  Filled 2016-07-06 (×5): qty 3

## 2016-07-06 MED ORDER — POLYETHYLENE GLYCOL 3350 17 G PO PACK
17.0000 g | PACK | Freq: Every day | ORAL | Status: DC | PRN
  Administered 2016-07-06: 17 g via ORAL
  Filled 2016-07-06: qty 1

## 2016-07-06 MED ORDER — METOPROLOL TARTRATE 25 MG PO TABS
25.0000 mg | ORAL_TABLET | Freq: Two times a day (BID) | ORAL | Status: DC
  Administered 2016-07-06 (×2): 25 mg via ORAL
  Filled 2016-07-06 (×2): qty 1

## 2016-07-06 MED ORDER — VITAMIN B-12 1000 MCG PO TABS
1000.0000 ug | ORAL_TABLET | Freq: Every day | ORAL | Status: DC
  Administered 2016-07-06 – 2016-07-07 (×2): 1000 ug via ORAL
  Filled 2016-07-06 (×2): qty 1

## 2016-07-06 MED ORDER — POTASSIUM CHLORIDE IN NACL 20-0.9 MEQ/L-% IV SOLN
INTRAVENOUS | Status: DC
  Administered 2016-07-06: 09:00:00 via INTRAVENOUS
  Filled 2016-07-06 (×2): qty 1000

## 2016-07-06 MED ORDER — ATORVASTATIN CALCIUM 10 MG PO TABS
10.0000 mg | ORAL_TABLET | Freq: Every day | ORAL | Status: DC
  Administered 2016-07-06 – 2016-07-07 (×2): 10 mg via ORAL
  Filled 2016-07-06 (×3): qty 1

## 2016-07-06 MED ORDER — ACETAMINOPHEN 325 MG PO TABS: 650.0000 mg | ORAL_TABLET | Freq: Four times a day (QID) | ORAL | Status: DC | PRN

## 2016-07-06 MED ORDER — SODIUM CHLORIDE 0.9 % IV BOLUS (SEPSIS): 1000.0000 mL | Freq: Once | INTRAVENOUS | Status: DC

## 2016-07-06 MED ORDER — AMLODIPINE BESYLATE 5 MG PO TABS
2.5000 mg | ORAL_TABLET | Freq: Every day | ORAL | Status: DC
  Administered 2016-07-06 – 2016-07-07 (×2): 2.5 mg via ORAL
  Filled 2016-07-06 (×2): qty 1

## 2016-07-06 NOTE — H&P (Addendum)
Jessica [name] is an 71 y.o. female.   Chief Complaint: Hallucinations HPI: The patient with past medical history of spinal stenosis with paraplegia presents emergency department complaining of hallucinations. The patient states that she sees fish in her bed and that she is concerned that her intestines are on the floor. She also states that she sees snakes at times. She is aware of these things are not present. She also feels as if something may be in her rectum. The latter was confirmed not to be true by emergency department staff. However, her husband reports that the patient has been constipated lately. Also, her dose of baclofen had been increased recently. There was some concern the patient may have the beginning of a urinary tract infection which could be leading to her delirium as well. She was being prepared for discharge from the emergency department when she had multiple runs of asymptomatic tachycardia as high as 130 which prompted emergency department staff to call the hospitalist service for admission.  Past Medical History:  Diagnosis Date  . Spinal stenosis     Past Surgical History:  Procedure Laterality Date  . BACK SURGERY      Family History  Problem Relation Age of Onset  . Heart disease Father    Social History:  reports that she has never smoked. Her smokeless tobacco use includes Snuff. She reports that she does not drink alcohol or use drugs.  Allergies:  Allergies  Allergen Reactions  . Ambien [Zolpidem Tartrate] Other (See Comments)    Reaction: altered mental status  . Amoxicillin Other (See Comments)    Reaction: blisters in mouth Has patient had a PCN reaction causing immediate rash, facial/tongue/throat swelling, SOB or lightheadedness with hypotension: Yes Has patient had a PCN reaction causing severe rash involving mucus membranes or skin necrosis: No Has patient had a PCN reaction that required hospitalization No Has patient had a PCN reaction  occurring within the last 10 years: Yes If all of the above answers are "NO", then may proceed with Cephalosporin use.     Medications Prior to Admission  Medication Sig Dispense Refill  . amlodipine-atorvastatin (CADUET) 2.5-10 MG tablet Take 1 tablet by mouth daily.    Marland Kitchen aspirin EC 81 MG tablet Take 81 mg by mouth daily.    . baclofen (LIORESAL) 10 MG tablet Take 15 mg by mouth daily at 2 PM.    . baclofen (LIORESAL) 20 MG tablet Take 20 mg by mouth 2 (two) times daily.    Marland Kitchen docusate sodium (COLACE) 100 MG capsule Take 100 mg by mouth 2 (two) times daily.    . furosemide (LASIX) 20 MG tablet Take 20 mg by mouth daily.    . hydrOXYzine (ATARAX/VISTARIL) 25 MG tablet Take 25 mg by mouth 2 (two) times daily as needed for itching.    . levothyroxine (SYNTHROID, LEVOTHROID) 150 MCG tablet Take 150 mcg by mouth daily before breakfast.    . potassium chloride (K-DUR,KLOR-CON) 10 MEQ tablet Take 10 mEq by mouth daily. Take with 20 mEq tab for a total of 30 mEq    . potassium chloride SA (K-DUR,KLOR-CON) 20 MEQ tablet Take 20 mEq by mouth daily. Take with 10 mEq tab for a total of 30 mEq    . pregabalin (LYRICA) 150 MG capsule Take 150 mg by mouth 3 (three) times daily.    Marland Kitchen senna-docusate (SENOKOT-S) 8.6-50 MG tablet Take 2 tablets by mouth daily.    . Simethicone 180 MG CAPS Take 2  capsules by mouth 3 (three) times daily.    . traZODone (DESYREL) 100 MG tablet Take 100 mg by mouth at bedtime.    . Venlafaxine HCl 225 MG TB24 Take 225 mg by mouth daily.    . vitamin B-12 (CYANOCOBALAMIN) 1000 MCG tablet Take 1,000 mcg by mouth daily.      Results for orders placed or performed during the hospital encounter of 07/06/16 (from the past 48 hour(s))  CBC with Differential     Status: Abnormal   Collection Time: 07/06/16  1:56 AM  Result Value Ref Range   WBC 9.3 3.6 - 11.0 K/uL   RBC 5.75 (H) 3.80 - 5.20 MIL/uL   Hemoglobin 15.3 12.0 - 16.0 g/dL   HCT 44.9 35.0 - 47.0 %   MCV 78.1 (L) 80.0 -  100.0 fL   MCH 26.6 26.0 - 34.0 pg   MCHC 34.0 32.0 - 36.0 g/dL   RDW 14.0 11.5 - 14.5 %   Platelets 297 150 - 440 K/uL   Neutrophils Relative % 76 %   Neutro Abs 7.0 (H) 1.4 - 6.5 K/uL   Lymphocytes Relative 17 %   Lymphs Abs 1.6 1.0 - 3.6 K/uL   Monocytes Relative 6 %   Monocytes Absolute 0.6 0.2 - 0.9 K/uL   Eosinophils Relative 0 %   Eosinophils Absolute 0.0 0 - 0.7 K/uL   Basophils Relative 1 %   Basophils Absolute 0.1 0 - 0.1 K/uL  Comprehensive metabolic panel     Status: Abnormal   Collection Time: 07/06/16  1:56 AM  Result Value Ref Range   Sodium 136 135 - 145 mmol/L   Potassium 3.2 (L) 3.5 - 5.1 mmol/L   Chloride 98 (L) 101 - 111 mmol/L   CO2 29 22 - 32 mmol/L   Glucose, Bld 129 (H) 65 - 99 mg/dL   BUN 8 6 - 20 mg/dL   Creatinine, Ser 0.59 0.44 - 1.00 mg/dL   Calcium 9.5 8.9 - 10.3 mg/dL   Total Protein 7.6 6.5 - 8.1 g/dL   Albumin 3.4 (L) 3.5 - 5.0 g/dL   AST 44 (H) 15 - 41 U/L   ALT 26 14 - 54 U/L   Alkaline Phosphatase 119 38 - 126 U/L   Total Bilirubin 0.8 0.3 - 1.2 mg/dL   GFR calc non Af Amer >60 >60 mL/min   GFR calc Af Amer >60 >60 mL/min    Comment: (NOTE) The eGFR has been calculated using the CKD EPI equation. This calculation has not been validated in all clinical situations. eGFR's persistently <60 mL/min signify possible Chronic Kidney Disease.    Anion gap 9 5 - 15  Troponin I     Status: None   Collection Time: 07/06/16  1:56 AM  Result Value Ref Range   Troponin I <0.03 <0.03 ng/mL  Urinalysis, Complete w Microscopic     Status: Abnormal   Collection Time: 07/06/16  1:56 AM  Result Value Ref Range   Color, Urine AMBER (A) YELLOW    Comment: BIOCHEMICALS MAY BE AFFECTED BY COLOR   APPearance HAZY (A) CLEAR   Specific Gravity, Urine 1.017 1.005 - 1.030   pH 6.0 5.0 - 8.0   Glucose, UA NEGATIVE NEGATIVE mg/dL   Hgb urine dipstick NEGATIVE NEGATIVE   Bilirubin Urine NEGATIVE NEGATIVE   Ketones, ur NEGATIVE NEGATIVE mg/dL   Protein, ur  NEGATIVE NEGATIVE mg/dL   Nitrite NEGATIVE NEGATIVE   Leukocytes, UA TRACE (A) NEGATIVE   RBC /  HPF 0-5 0 - 5 RBC/hpf   WBC, UA 0-5 0 - 5 WBC/hpf   Bacteria, UA RARE (A) NONE SEEN   Squamous Epithelial / LPF 6-30 (A) NONE SEEN   Mucous PRESENT   TSH     Status: None   Collection Time: 07/06/16  1:56 AM  Result Value Ref Range   TSH 0.802 0.350 - 4.500 uIU/mL    Comment: Performed by a 3rd Generation assay with a functional sensitivity of <=0.01 uIU/mL.   Ct Head Wo Contrast  Result Date: 07/06/2016 CLINICAL DATA:  Acute onset of hallucinations.  Initial encounter. EXAM: CT HEAD WITHOUT CONTRAST TECHNIQUE: Contiguous axial images were obtained from the base of the skull through the vertex without intravenous contrast. COMPARISON:  CT of the head performed 10/24/2009 FINDINGS: Brain: No evidence of acute infarction, hemorrhage, hydrocephalus, extra-axial collection or mass lesion/mass effect. Prominence of the ventricles and sulci reflects mild cortical volume loss. Mild cerebellar atrophy is noted. Scattered periventricular and subcortical white matter change likely reflects small vessel ischemic microangiopathy. The brainstem and fourth ventricle are within normal limits. The basal ganglia are unremarkable in appearance. The cerebral hemispheres demonstrate grossly normal gray-white differentiation. No mass effect or midline shift is seen. Vascular: No hyperdense vessel or unexpected calcification. Skull: There is no evidence of fracture; visualized osseous structures are unremarkable in appearance. Sinuses/Orbits: The orbits are within normal limits. The paranasal sinuses and mastoid air cells are well-aerated. Other: No significant soft tissue abnormalities are seen. IMPRESSION: 1. No acute intracranial pathology seen on CT. 2. Mild cortical volume loss and scattered small vessel ischemic microangiopathy. Electronically Signed   By: Garald Balding M.D.   On: 07/06/2016 02:46   Dg Chest Port 1  View  Result Date: 07/06/2016 CLINICAL DATA:  Acute onset of hallucinations.  Initial encounter. EXAM: PORTABLE CHEST 1 VIEW COMPARISON:  Chest radiograph performed 01/07/2011 FINDINGS: The lungs are well-aerated. Minimal left basilar scarring is noted. There is no evidence of pleural effusion or pneumothorax. The cardiomediastinal silhouette is mildly enlarged. No acute osseous abnormalities are seen. IMPRESSION: Mild cardiomegaly.  Lungs remain grossly clear. Electronically Signed   By: Garald Balding M.D.   On: 07/06/2016 02:40    Review of Systems  Constitutional: Negative for chills and fever.  HENT: Negative for sore throat and tinnitus.   Eyes: Negative for blurred vision and redness.  Respiratory: Negative for cough and shortness of breath.   Cardiovascular: Negative for chest pain, palpitations, orthopnea and PND.  Gastrointestinal: Positive for constipation. Negative for abdominal pain, diarrhea, nausea and vomiting.  Genitourinary: Negative for dysuria, frequency and urgency.  Musculoskeletal: Negative for joint pain and myalgias.  Skin: Negative for rash.       No lesions  Neurological: Negative for speech change, focal weakness and weakness.  Endo/Heme/Allergies: Does not bruise/bleed easily.       No temperature intolerance  Psychiatric/Behavioral: Positive for hallucinations. Negative for depression and suicidal ideas.    Blood pressure (!) 142/69, pulse 82, temperature 97.9 F (36.6 C), temperature source Oral, resp. rate 18, SpO2 95 %. Physical Exam  Vitals reviewed. Constitutional: She is oriented to person, place, and time. She appears well-developed and well-nourished. No distress.  HENT:  Head: Normocephalic and atraumatic.  Mouth/Throat: Oropharynx is clear and moist.  Eyes: Conjunctivae and EOM are normal. Pupils are equal, round, and reactive to light. No scleral icterus.  Neck: Normal range of motion. Neck supple. No JVD present. No tracheal deviation present.  No thyromegaly present.  Cardiovascular: Normal rate, regular rhythm and normal heart sounds.  Exam reveals no gallop and no friction rub.   No murmur heard. Respiratory: Effort normal and breath sounds normal.  GI: Soft. Bowel sounds are normal. She exhibits no distension. There is no tenderness.  Musculoskeletal: She exhibits edema (Dependent).  Paraplegic  Lymphadenopathy:    She has no cervical adenopathy.  Neurological: She is alert and oriented to person, place, and time. No cranial nerve deficit. She exhibits normal muscle tone.  Skin: Skin is warm and dry. No rash noted. No erythema.  Psychiatric: She has a normal mood and affect. Her behavior is normal. Judgment and thought content normal.     Assessment/Plan This is a 71 year old patient admitted for tachycardia arrhythmias. 1. Tachyarrhythmia: Sinus; unclear etiology. A symptomatic. May be secondary to polypharmacy or autonomic dysfunction. Continue to monitor telemetry. Cardiology consulted. 2. Essential hypertension: Relatively controlled; continue amlodipine 3. Hypothyroidism: Continue Synthroid 4. Delirium: Resolved; hold baclofen for now. Bladder distention or constipation could also be contributing. Check for urinary retention and consider enema as the patient's husband who is her primary caregiver states this is frequently the only way or the patient to evacuate completely. Urinalysis is not consistent with urinary tract infection and the patient denies dysuria, increased frequency urgency or hesitation of urination. 5. Depression: Continue Effexor or and trazodone 6. Hyperlipidemia: Continue statin therapy 7. DVT prophylaxis: Lovenox 8. GI prophylaxis: None  Harrie Foreman, MD 07/06/2016, 8:10 AM

## 2016-07-06 NOTE — Progress Notes (Signed)
[name] is very concerned with mother seeing snakes. [name] states that no one is addressing why she is seeing snakes.

## 2016-07-06 NOTE — ED Notes (Signed)
Patients husband informed RN that he believes patient has to have a bowel movement. He further explained that he gives her an enema daily to assist with bowel movements. Patient denied urge to have a bowel movement. RN informed MD of spouse's request that the patient have an enema.

## 2016-07-06 NOTE — ED Provider Notes (Signed)
[facility] Emergency Department Provider Note   ____________________________________________   First MD Initiated Contact with Patient 07/06/16 [phone]     (approximate)  I have reviewed the triage vital signs and the nursing notes.   HISTORY  Chief Complaint Hallucinations  History obtained by son and husband  HPI Jessica [name] is a 71 y.o. female brought to the ED from home via EMS with a chief complaint of visual hallucinations. Patient has been bedbound for the past 10 years, followed by [facility]. Recently diagnosed with spinal stenosis and prescribed baclofen. Son reports for the past 3 days patient has been seeing birds and fish around her.Tonight he heard her calling someone a "bitch" but did not see anyone with the patient. Patient tells me that "bitch" stuck a make up compact up her bottom. Family denies fever, chest pain, cough, shortness of breath, abdominal pain, vomiting, diarrhea. Son does note frequent UTIs. Denies recent travel or trauma. Nothing makes her symptoms better or worse.   History reviewed. No pertinent past medical history.  Patient Active Problem List   Diagnosis Date Noted  . RECTAL PAIN 09/04/2009  . CONSTIPATION 07/08/2009  . DEEP VENOUS THROMBOPHLEBITIS, BILATERAL 01/07/2009  . PALLOR 01/07/2009  . DIFFICULTY IN [facility] 11/22/2008  . ABDOMINAL OR PELVIC SWELLING MASS OR LUMP LLQ 10/01/2008  . 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 HYPERLIPIDEMIA 12/10/2006  . ANXIETY 12/10/2006  . DEPRESSION 12/10/2006  . HYPERTENSION 12/10/2006  . OSTEOARTHRITIS 12/10/2006  . HYPOTHYROIDISM 09/22/2006  . HYPERCHOLESTEROLEMIA 09/22/2006  . ECZEMA 09/22/2006    Past Surgical History:  Procedure Laterality Date  . BACK SURGERY      Prior to Admission medications   Not on File    Allergies Ambien [zolpidem tartrate] and Amoxicillin  No family history on file.  Social History Social History  Substance Use Topics  .  Smoking status: Never Smoker  . Smokeless tobacco: Current User    Types: Snuff  . Alcohol use No    Review of Systems  Constitutional: No fever/chills. Eyes: No visual changes. ENT: No sore throat. Cardiovascular: Denies chest pain. Respiratory: Denies shortness of breath. Gastrointestinal: No abdominal pain.  No nausea, no vomiting.  No diarrhea.  No constipation. Genitourinary: Negative for dysuria. Musculoskeletal: Negative for back pain. Skin: Negative for rash. Neurological: Negative for headaches, focal weakness or numbness. Psychiatric:Positive for visual hallucinations.  10-point ROS otherwise negative.  ____________________________________________   PHYSICAL EXAM:  VITAL SIGNS: ED Triage Vitals [07/06/16 0148]  Enc Vitals Group     BP      Pulse      Resp      Temp 98.1 F (36.7 C)     Temp Source Oral     SpO2      Weight      Height      Head Circumference      Peak Flow      Pain Score 0     Pain Loc      Pain Edu?      Excl. in GC?      Constitutional: Alert and oriented. Well appearing and in no acute distress. Eyes: Conjunctivae are normal. PERRL. EOMI. Head: Atraumatic. Nose: No congestion/rhinnorhea. Mouth/Throat: Mucous membranes are moist.  Oropharynx non-erythematous. Neck: No stridor.  Supple neck without meningismus. Cardiovascular: Normal rate, regular rhythm. Grossly normal heart sounds.  Good peripheral circulation. Respiratory: Normal respiratory effort.  No retractions. Lungs CTAB. Gastrointestinal: Obese. Soft and nontender. No distention. No abdominal bruits.  No CVA tenderness. No foreign body noted in rectum. Musculoskeletal: No lower extremity tenderness nor edema.  No joint effusions. Neurologic:  Normal speech and language. No gross focal neurologic deficits are appreciated.  Skin:  Skin is warm, dry and intact. No rash noted. Psychiatric: Mood and affect are normal. Speech and behavior are  normal.  ____________________________________________   LABS (all labs ordered are listed, but only abnormal results are displayed)  Labs Reviewed  CBC WITH DIFFERENTIAL/PLATELET - Abnormal; Notable for the following:       Result Value   RBC 5.75 (*)    MCV 78.1 (*)    Neutro Abs 7.0 (*)    All other components within normal limits  COMPREHENSIVE METABOLIC PANEL - Abnormal; Notable for the following:    Potassium 3.2 (*)    Chloride 98 (*)    Glucose, Bld 129 (*)    Albumin 3.4 (*)    AST 44 (*)    All other components within normal limits  URINALYSIS, COMPLETE (UACMP) WITH MICROSCOPIC - Abnormal; Notable for the following:    Color, Urine AMBER (*)    APPearance HAZY (*)    Leukocytes, UA TRACE (*)    Bacteria, UA RARE (*)    Squamous Epithelial / LPF 6-30 (*)    All other components within normal limits  TROPONIN I  TSH  CBC   ____________________________________________  EKG  ED ECG REPORT I, Hulda Reddix J, the attending physician, personally viewed and interpreted this ECG.   Date: 07/06/2016  EKG Time: 0156  Rate: 86  Rhythm: normal EKG, normal sinus rhythm  Axis: Normal  Intervals:right bundle branch block LPFB  ST&T Change: Nonspecific  ____________________________________________  RADIOLOGY  CT head interpreted per Dr. Cherly Hensenhang: 1. No acute intracranial pathology seen on CT.  2. Mild cortical volume loss and scattered small vessel ischemic  microangiopathy.   Portable chest x-ray interpreted per Dr. Cherly Hensenhang: Mild cardiomegaly. Lungs remain grossly clear. ____________________________________________   PROCEDURES  Procedure(s) performed: None  Procedures  Critical Care performed: No  ____________________________________________   INITIAL IMPRESSION / ASSESSMENT AND PLAN / ED COURSE  Pertinent labs & imaging results that were available during my care of the patient were reviewed by me and considered in my medical decision making (see chart for  details).  71 year old female who presents with visual hallucinations. [facility] M[facility] called charge nurse; will follow patient in clinic in the morning if she is able to be discharged. Clinical Course as of Jul 06 516  Mon Jul 06, 2016  0423 Heart rate noted to be in the 120s. Will recheck temperature.  [JS]  0457 Without intervention, heart rate now back to 70s to 80s.  [JS]  [id number] Patient has had 2 more episodes of sinus tachycardia, normal sinus rhythm, and now heart rate 120s. Discussed with hospitalist to evaluate patient in the emergency department for admission.  [JS]    Clinical Course User Index [JS] Irean HongJade J Misheel Gowans, MD     ____________________________________________   FINAL CLINICAL IMPRESSION(S) / ED DIAGNOSES  Final diagnoses:  Tachyarrhythmia  Hallucinations      NEW MEDICATIONS STARTED DURING THIS VISIT:  New Prescriptions   No medications on file     Note:  This document was prepared using Dragon voice recognition software and may include unintentional dictation errors.    Irean HongJade J Arleny Kruger, MD 07/06/16 [phone]

## 2016-07-06 NOTE — Consult Note (Signed)
Cardiology Consultation Note  Patient ID: ICIS Jessica [name], MRN: [id number], DOB/AGE: 12/27/1945 71 y.o. Admit date: 07/06/2016   Date of Consult: 07/06/2016 Primary Physician: Crawford Givens, MD Primary Cardiologist: New to [facility] -consult by Kirke Corin Requesting Physician: Dr. Sheryle Hail, MD  Chief Complaint: Visual hallucinations  Reason for Consult: Sinus tachycardia   HPI: 71 y.o. female with h/o visual hallucinations, HTN, HLD, recurrent UTI, hypothyroidism, spinal stenosis, depression, anxiety, chronic pain syndrome who presented to [facility] on 3/26 with visual hallucinations described in detail as below. She was noted to be severely hypokalemic and have episodes of sinus tachycardia during these visual hallucinations. Cardiology is asked to further evaluate.  Patient does not have any previously known cardiac history. She hasn't seen a cardiologist before. Patient reports a 1.5-3 week history of visual hallucinations, stating there are "fish in her bed riding her toes" as well as "baby birds in her bed riding her toes" also stating there are "snakes in her bed" and "her intestines are on the floor." Most recently, in the ED, patient reported "get this bitch off my bed" (there was no one else in the room other than her son who was on the floor sitting. Patient was also worked up in the ED and anxious secondary to her son reporting chest pain needing evaluation, subsequently discharged from the ED. Patient has been completely asymptomatic in the setting of her sinus tachycardia with heart rates into the 120s. No associated shortness of breath, chest pain, palpitations, dizziness, diaphoresis, presyncope, or syncope.  Upon the patient's arrival to [facility] they were found to have systolic blood pressure 1 56 mmHg, heart rate 124 bpm, afebrile, oxygen saturation 97% on room air. Review his telemetry has shown predominant rhythm of sinus with heart rate in the 80s bpm with occasional episodes of sinus tachycardia  into the 120s with short runs of atrial tachycardia. Labs were significant for potassium of 3.2, normal TSH, negative troponin 1, unremarkable CBC, serum creatinine 0.59, sodium 136, albumin 3.4.  ECG as below, CXR showed mild cardiomegaly without acute cardiopulmonary process. CT head nonacute. Currently completely asymptomatic with sinus rhythm in the 80s bpm. She has received 30 mEq of KCl today with another 40 mEq pending at this time. Son is in the room who assists with patient's history.   Past Medical History:  Diagnosis Date  . Chronic pain   . Depression   . HLD (hyperlipidemia)   . HTN (hypertension)   . Hypothyroidism   . Morbid obesity (HCC)   . Recurrent UTI   . Spinal stenosis   . Visual hallucinations       Most Recent Cardiac Studies: none   Surgical History:  Past Surgical History:  Procedure Laterality Date  . BACK SURGERY       Home Meds: Prior to Admission medications   Medication Sig Start Date End Date Taking? Authorizing Provider  amlodipine-atorvastatin (CADUET) 2.5-10 MG tablet Take 1 tablet by mouth daily.   Yes Historical Provider, MD  aspirin EC 81 MG tablet Take 81 mg by mouth daily.   Yes Historical Provider, MD  baclofen (LIORESAL) 10 MG tablet Take 15 mg by mouth daily at 2 PM.   Yes Historical Provider, MD  baclofen (LIORESAL) 20 MG tablet Take 20 mg by mouth 2 (two) times daily.   Yes Historical Provider, MD  docusate sodium (COLACE) 100 MG capsule Take 100 mg by mouth 2 (two) times daily.   Yes Historical Provider, MD  furosemide (LASIX) 20 MG tablet  Take 20 mg by mouth daily.   Yes Historical Provider, MD  hydrOXYzine (ATARAX/VISTARIL) 25 MG tablet Take 25 mg by mouth 2 (two) times daily as needed for itching.   Yes Historical Provider, MD  levothyroxine (SYNTHROID, LEVOTHROID) 150 MCG tablet Take 150 mcg by mouth daily before breakfast.   Yes Historical Provider, MD  potassium chloride (K-DUR,KLOR-CON) 10 MEQ tablet Take 10 mEq by mouth daily.  Take with 20 mEq tab for a total of 30 mEq   Yes Historical Provider, MD  potassium chloride SA (K-DUR,KLOR-CON) 20 MEQ tablet Take 20 mEq by mouth daily. Take with 10 mEq tab for a total of 30 mEq   Yes Historical Provider, MD  pregabalin (LYRICA) 150 MG capsule Take 150 mg by mouth 3 (three) times daily.   Yes Historical Provider, MD  senna-docusate (SENOKOT-S) 8.6-50 MG tablet Take 2 tablets by mouth daily.   Yes Historical Provider, MD  Simethicone 180 MG CAPS Take 2 capsules by mouth 3 (three) times daily.   Yes Historical Provider, MD  traZODone (DESYREL) 100 MG tablet Take 100 mg by mouth at bedtime.   Yes Historical Provider, MD  Venlafaxine HCl 225 MG TB24 Take 225 mg by mouth daily.   Yes Historical Provider, MD  vitamin B-12 (CYANOCOBALAMIN) 1000 MCG tablet Take 1,000 mcg by mouth daily.   Yes Historical Provider, MD    Inpatient Medications:  . amLODipine  2.5 mg Oral Daily   And  . atorvastatin  10 mg Oral Daily  . aspirin EC  81 mg Oral Daily  . docusate sodium  100 mg Oral BID  . enoxaparin (LOVENOX) injection  40 mg Subcutaneous Q24H  . furosemide  20 mg Oral Daily  . levothyroxine  150 mcg Oral QAC breakfast  . potassium chloride  10 mEq Oral Daily  . potassium chloride SA  20 mEq Oral Daily  . potassium chloride  40 mEq Oral Once  . pregabalin  150 mg Oral TID  . sodium chloride  1,000 mL Intravenous Once  . sodium chloride flush  3 mL Intravenous Q12H  . traZODone  100 mg Oral QHS  . venlafaxine XR  225 mg Oral Daily  . vitamin B-12  1,000 mcg Oral Daily   . 0.9 % NaCl with KCl 20 mEq / L 75 mL/hr at 07/06/16 1610    Allergies:  Allergies  Allergen Reactions  . Ambien [Zolpidem Tartrate] Other (See Comments)    Reaction: altered mental status  . Amoxicillin Other (See Comments)    Reaction: blisters in mouth Has patient had a PCN reaction causing immediate rash, facial/tongue/throat swelling, SOB or lightheadedness with hypotension: Yes Has patient had a PCN  reaction causing severe rash involving mucus membranes or skin necrosis: No Has patient had a PCN reaction that required hospitalization No Has patient had a PCN reaction occurring within the last 10 years: Yes If all of the above answers are "NO", then may proceed with Cephalosporin use.     Social History   Social History  . Marital status: Married    Spouse name: N/A  . Number of children: N/A  . Years of education: N/A   Occupational History  . Not on file.   Social History Main Topics  . Smoking status: Never Smoker  . Smokeless tobacco: Current User    Types: Snuff  . Alcohol use No  . Drug use: No  . Sexual activity: Not on file   Other Topics Concern  . Not  on file   Social History Narrative  . No narrative on file     Family History  Problem Relation Age of Onset  . Heart disease Father      Review of Systems: Review of Systems  Constitutional: Positive for malaise/fatigue. Negative for chills, diaphoresis, fever and weight loss.  HENT: Negative for congestion.   Eyes: Negative for discharge and redness.  Respiratory: Negative for cough, hemoptysis, sputum production, shortness of breath and wheezing.   Cardiovascular: Negative for chest pain, palpitations, orthopnea, claudication, leg swelling and PND.  Gastrointestinal: Negative for abdominal pain, blood in stool, heartburn, melena, nausea and vomiting.  Genitourinary: Negative for hematuria.  Musculoskeletal: Negative for falls and myalgias.  Skin: Negative for rash.  Neurological: Negative for dizziness, tingling, tremors, sensory change, speech change, focal weakness, seizures, loss of consciousness, weakness and headaches.  Endo/Heme/Allergies: Does not bruise/bleed easily.  Psychiatric/Behavioral: Positive for depression and hallucinations. Negative for substance abuse. The patient is not nervous/anxious.   All other systems reviewed and are negative.   Labs:  Recent Labs  07/06/16 0156    TROPONINI <0.03   Lab Results  Component Value Date   WBC 9.3 07/06/2016   HGB 15.3 07/06/2016   HCT 44.9 07/06/2016   MCV 78.1 (L) 07/06/2016   PLT 297 07/06/2016     Recent Labs Lab 07/06/16 0156  NA 136  K 3.2*  CL 98*  CO2 29  BUN 8  CREATININE 0.59  CALCIUM 9.5  PROT 7.6  BILITOT 0.8  ALKPHOS 119  ALT 26  AST 44*  GLUCOSE 129*   No results found for: CHOL, HDL, LDLCALC, TRIG No results found for: DDIMER  Radiology/Studies:  Ct Head Wo Contrast  Result Date: 07/06/2016 CLINICAL DATA:  Acute onset of hallucinations.  Initial encounter. EXAM: CT HEAD WITHOUT CONTRAST TECHNIQUE: Contiguous axial images were obtained from the base of the skull through the vertex without intravenous contrast. COMPARISON:  CT of the head performed 10/24/2009 FINDINGS: Brain: No evidence of acute infarction, hemorrhage, hydrocephalus, extra-axial collection or mass lesion/mass effect. Prominence of the ventricles and sulci reflects mild cortical volume loss. Mild cerebellar atrophy is noted. Scattered periventricular and subcortical white matter change likely reflects small vessel ischemic microangiopathy. The brainstem and fourth ventricle are within normal limits. The basal ganglia are unremarkable in appearance. The cerebral hemispheres demonstrate grossly normal gray-white differentiation. No mass effect or midline shift is seen. Vascular: No hyperdense vessel or unexpected calcification. Skull: There is no evidence of fracture; visualized osseous structures are unremarkable in appearance. Sinuses/Orbits: The orbits are within normal limits. The paranasal sinuses and mastoid air cells are well-aerated. Other: No significant soft tissue abnormalities are seen. IMPRESSION: 1. No acute intracranial pathology seen on CT. 2. Mild cortical volume loss and scattered small vessel ischemic microangiopathy. Electronically Signed   By: Roanna Raider M.D.   On: 07/06/2016 02:46   Dg Chest Port 1  View  Result Date: 07/06/2016 CLINICAL DATA:  Acute onset of hallucinations.  Initial encounter. EXAM: PORTABLE CHEST 1 VIEW COMPARISON:  Chest radiograph performed 01/07/2011 FINDINGS: The lungs are well-aerated. Minimal left basilar scarring is noted. There is no evidence of pleural effusion or pneumothorax. The cardiomediastinal silhouette is mildly enlarged. No acute osseous abnormalities are seen. IMPRESSION: Mild cardiomegaly.  Lungs remain grossly clear. Electronically Signed   By: Roanna Raider M.D.   On: 07/06/2016 02:40    EKG: Interpreted by me showed: Sinus tachycardia, 118 bpm, right bundle branch block, nonspecific ST-T changes  Telemetry: Interpreted by me showed: Currently in sinus rhythm with heart rates in the 80s bpm. Occasional episodes of sinus tachycardia with heart rates into the 120s bpm with associated short run of atrial tachycardia  Weights: Filed Weights   07/06/16 1008  Weight: 200 lb 6.4 oz (90.9 kg)     Physical Exam: Blood pressure (!) 142/69, pulse 82, temperature 97.9 F (36.6 C), temperature source Oral, resp. rate 18, height 5\' 10"  (1.778 m), weight 200 lb 6.4 oz (90.9 kg), SpO2 95 %. Body mass index is 28.75 kg/m. General: Well developed, well nourished, in no acute distress. Head: Normocephalic, atraumatic, sclera non-icteric, no xanthomas, nares are without discharge.  Neck: Negative for carotid bruits. JVD not elevated. Lungs: Clear bilaterally to auscultation without wheezes, rales, or rhonchi. Breathing is unlabored. Heart: RRR with S1 S2. No murmurs, rubs, or gallops appreciated. Abdomen: Obese, soft, non-tender, non-distended with normoactive bowel sounds. No hepatomegaly. No rebound/guarding. No obvious abdominal masses. Msk:  Strength and tone appear normal for age. Extremities: No clubbing or cyanosis. No edema. Distal pedal pulses are 2+ and equal bilaterally. Neuro: Alert and oriented X 3. No facial asymmetry. No focal deficit. Moves all  extremities spontaneously. Psych:  Responds to questions appropriately with a normal affect.    Assessment and Plan:  Principal Problem:   Hallucinations Active Problems:   Hypokalemia   Sinus tachycardia    1. Sinus tachycardia: -Almost certainly in the setting of the patient's agitation with her visual hallucinations as well as increased stress in the setting of her son requiring E DMD evaluation while she was in the ED for chest pain as well as patient's hypokalemia with potassium 3.2 -Sinus tachycardia is generally not a primary cardiac event and must address the underlying driving force as above -Could consider checking echocardiogram to evaluate for tachycardia mediated cardiomyopathy, can be done as outpatient -Recommend repleting potassium to goal of at least 4.0, check magnesium, consider psychiatric evaluation for her visual hallucinations  2. Hypokalemia: -Replete to goal 4.0 -Will check potassium later this afternoon -Check magnesium  3. Visual hallucinations: -Per internal medicine/psychiatry    Signed, Eula Listenyan Dymphna Wadley, PA-C [facility] Pager: [phone] 07/06/2016, 11:07 AM

## 2016-07-06 NOTE — ED Triage Notes (Addendum)
Pt to ED via EMS from home , family states family has been hallucinating x3days during the night. Pt recently diagnosed with spinal stenosis and prescribed baclofen. Pt A&Ox4, denies any SI/HI. VS stable

## 2016-07-06 NOTE — ED Notes (Signed)
MD Diamond at bedside. 

## 2016-07-06 NOTE — ED Notes (Signed)
MD Sung at bedside. 

## 2016-07-06 NOTE — Care Management Obs Status (Signed)
MEDICARE OBSERVATION STATUS NOTIFICATION   Patient Details  Name: Jessica [name] MRN: [id number] Date of Birth: [birth year]   Medicare Observation Status Notification Given:  Yes    Eber HongGreene, Niomi Valent R, RN 07/06/2016, 2:24 PM

## 2016-07-06 NOTE — Plan of Care (Signed)
Problem: Safety: Goal: Ability to remain free from injury will improve Outcome: Progressing Bed alarm on, patient and family educated to call for assistance. Hourly rounding completed.   Problem: Pain Managment: Goal: General experience of comfort will improve Outcome: Progressing Patient denies pain.   Problem: Physical Regulation: Goal: Ability to maintain clinical measurements within normal limits will improve Outcome: Not Progressing Patients HR unstable from 90s-120s. PO metoprolol ordered bid. Will continue to monitor.   Problem: Skin Integrity: Goal: Risk for impaired skin integrity will decrease Outcome: Progressing Turn patient q2 hours. Pink sacral foam dressing placed, clean, dry and intact.

## 2016-07-06 NOTE — Care Management (Signed)
patient placed in observation for tachycardia altered mental status.  She is followed at home by [facility] and receives in home assist several times a week.  Lives with her husband.  [facility] provide transportation.  No issues accessing medical care or obtaining medications.  Is experiencing visual hallucinations but is aware what she is seeing is not real

## 2016-07-06 NOTE — Progress Notes (Signed)
Nutrition Brief Note  Patient identified on the Malnutrition Screening Tool (MST) Report  Wt Readings from Last 15 Encounters:  07/06/16 200 lb 6.4 oz (90.9 kg)  09/25/08 145 lb (65.8 kg)  08/07/08 146 lb (66.2 kg)  03/27/08 153 lb (69.4 kg)  12/29/06 173 lb (78.5 kg)  17-Aug-2047 (!) 78245 g (172 lb 8 oz) (>99 %, Z > 4.26)*  09/22/06 172 lb (78 kg)   * Growth percentiles are based on WHO (Girls, 0-2 years) data.    Body mass index is 28.75 kg/m. Patient meets criteria for overweight based on current BMI.   Current diet order is heart healthy, patient is consuming approximately 100% of meals at this time. Labs and medications reviewed.   No nutrition interventions warranted at this time. If nutrition issues arise, please consult RD.   Dionne AnoWilliam M. Judas Mohammad, MS, RD LDN Inpatient Clinical Dietitian Pager [phone]

## 2016-07-06 NOTE — Progress Notes (Signed)
[employer] [facility] [facility] [place] at [facility]   [name]    MR#:  [id number]  DATE OF BIRTH:  1946/01/26  SUBJECTIVE:   Pt. Here due to tachycardia.  Pt. Noted to have heart rates in the 120s and appears to be sinus tachycardia. Asymptomatic. Not Having any visual hallucinations anymore  REVIEW OF SYSTEMS:    Review of Systems  Constitutional: Negative for chills and fever.  HENT: Negative for congestion and tinnitus.   Eyes: Negative for blurred vision and double vision.  Respiratory: Negative for cough, shortness of breath and wheezing.   Cardiovascular: Negative for chest pain, orthopnea and PND.  Gastrointestinal: Negative for abdominal pain, diarrhea, nausea and vomiting.  Genitourinary: Negative for dysuria and hematuria.  Neurological: Negative for dizziness, sensory change and focal weakness.  All other systems reviewed and are negative.   Nutrition: Heart Healthy Tolerating Diet: Yes Tolerating PT: Bedbound   DRUG ALLERGIES:   Allergies  Allergen Reactions  . Ambien [Zolpidem Tartrate] Other (See Comments)    Reaction: altered mental status  . Amoxicillin Other (See Comments)    Reaction: blisters in mouth Has patient had a PCN reaction causing immediate rash, facial/tongue/throat swelling, SOB or lightheadedness with hypotension: Yes Has patient had a PCN reaction causing severe rash involving mucus membranes or skin necrosis: No Has patient had a PCN reaction that required hospitalization No Has patient had a PCN reaction occurring within the last 10 years: Yes If all of the above answers are "NO", then may proceed with Cephalosporin use.     VITALS:  Blood pressure (!) 130/58, pulse 86, temperature 99.4 F (37.4 C), temperature source Oral, resp. rate 18, height 5\' 10"  (1.778 m), weight 90.9 kg (200 lb 6.4 oz), SpO2 93 %.  PHYSICAL EXAMINATION:   Physical Exam  GENERAL:  71 y.o.-year-old obese patient lying in the bed in no  acute distress.  EYES: Pupils equal, round, reactive to light and accommodation. No scleral icterus. Extraocular muscles intact.  HEENT: Head atraumatic, normocephalic. Oropharynx and nasopharynx clear.  NECK:  Supple, no jugular venous distention. No thyroid enlargement, no tenderness.  LUNGS: Normal breath sounds bilaterally, no wheezing, rales, rhonchi. No use of accessory muscles of respiration.  CARDIOVASCULAR: S1, S2 normal. No murmurs, rubs, or gallops.  ABDOMEN: Soft, nontender, nondistended. Bowel sounds present. No organomegaly or mass.  EXTREMITIES: No cyanosis, clubbing or edema b/l.    NEUROLOGIC: Cranial nerves II through XII are intact. No focal Motor or sensory deficits b/l.   PSYCHIATRIC: The patient is alert and oriented x 3.  No hallucinations.  SKIN: No obvious rash, lesion, or ulcer.    LABORATORY PANEL:   CBC  Recent Labs Lab 07/06/16 0156  WBC 9.3  HGB 15.3  HCT 44.9  PLT 297   ------------------------------------------------------------------------------------------------------------------  Chemistries   Recent Labs Lab 07/06/16 0156  NA 136  K 3.2*  CL 98*  CO2 29  GLUCOSE 129*  BUN 8  CREATININE 0.59  CALCIUM 9.5  AST 44*  ALT 26  ALKPHOS 119  BILITOT 0.8   ------------------------------------------------------------------------------------------------------------------  Cardiac Enzymes  Recent Labs Lab 07/06/16 0156  TROPONINI <0.03   ------------------------------------------------------------------------------------------------------------------  RADIOLOGY:  Ct Head Wo Contrast  Result Date: 07/06/2016 CLINICAL DATA:  Acute onset of hallucinations.  Initial encounter. EXAM: CT HEAD WITHOUT CONTRAST TECHNIQUE: Contiguous axial images were obtained from the base of the skull through the vertex without intravenous contrast. COMPARISON:  CT of the head performed 10/24/2009 FINDINGS: Brain: No evidence  of acute infarction, hemorrhage,  hydrocephalus, extra-axial collection or mass lesion/mass effect. Prominence of the ventricles and sulci reflects mild cortical volume loss. Mild cerebellar atrophy is noted. Scattered periventricular and subcortical white matter change likely reflects small vessel ischemic microangiopathy. The brainstem and fourth ventricle are within normal limits. The basal ganglia are unremarkable in appearance. The cerebral hemispheres demonstrate grossly normal gray-white differentiation. No mass effect or midline shift is seen. Vascular: No hyperdense vessel or unexpected calcification. Skull: There is no evidence of fracture; visualized osseous structures are unremarkable in appearance. Sinuses/Orbits: The orbits are within normal limits. The paranasal sinuses and mastoid air cells are well-aerated. Other: No significant soft tissue abnormalities are seen. IMPRESSION: 1. No acute intracranial pathology seen on CT. 2. Mild cortical volume loss and scattered small vessel ischemic microangiopathy. Electronically Signed   By: Roanna RaiderJeffery  Chang M.D.   On: 07/06/2016 02:46   Dg Chest Port 1 View  Result Date: 07/06/2016 CLINICAL DATA:  Acute onset of hallucinations.  Initial encounter. EXAM: PORTABLE CHEST 1 VIEW COMPARISON:  Chest radiograph performed 01/07/2011 FINDINGS: The lungs are well-aerated. Minimal left basilar scarring is noted. There is no evidence of pleural effusion or pneumothorax. The cardiomediastinal silhouette is mildly enlarged. No acute osseous abnormalities are seen. IMPRESSION: Mild cardiomegaly.  Lungs remain grossly clear. Electronically Signed   By: Roanna RaiderJeffery  Chang M.D.   On: 07/06/2016 02:40     ASSESSMENT AND PLAN:   71 year old female with past medical history of morbid obesity, hypertension, hyperlipidemia, chronic pain, depression, spinal stenosis, history of previous UTI, history of hallucinations or presents to the hospital due to visual hallucinations and also having episodes of  tachycardia.  1. Sinus tachycardia-etiology unclear. Patient has episodes where her heart rate goes up to the 120s to 130s and she is clinically asymptomatic with it. Questionable related to her electrolyte abnormalities with mild hypokalemia and also with underlying hallucinations. -No evidence of metabolic or infectious source presently. Appreciate cardiology input, will continue adjuvant supplementation, we'll get echocardiogram, start on low-dose metoprolol.  2. History of previous visual hallucinations-symptomatic now, if persists will consider psychiatric evaluation -Continue Effexor, trazodone  3. Hypokalemia - cont. With Potassium supplementation and will monitor.  4. Neuropathy - cont. Lyrica.   5. Hypothyroidism - cont. Synthroid - TSH normal.   6. HTN - cont. Norvasc  7. Hyperlipidemia - cont. Atorvastatin.    All the records are reviewed and case discussed with Care Management/Social Worker. Management plans discussed with the patient, family and they are in agreement.  CODE STATUS: Full code  DVT Prophylaxis: Lovenox  TOTAL TIME TAKING CARE OF THIS PATIENT: 30 minutes.   POSSIBLE D/C IN 1-2 DAYS, DEPENDING ON CLINICAL CONDITION.   Houston SirenSAINANI,VIVEK J M.D on 07/06/2016 at 2:50 PM  Between 7am to 6pm - Pager - [phone]  After 6pm go to [email or web address] - Social research officer, governmentpassword EPAS [facility]  [facility] [place] [facility]  Office  [phone]  CC: Primary care physician; Crawford GivensGraham Duncan, MD

## 2016-07-06 NOTE — Progress Notes (Signed)
Patients HR uncontrolled from 90s-120's. Patient asymptomatic. MD notified. MD to call cardiology.

## 2016-07-06 NOTE — ED Notes (Signed)
Patient having hallucinations that her intestines are hanging out of her body, and on the floor under her bed. RN lifted bed sheet and showed patient that her intestines are still inside her body. Pt again reported that her intestines are hanging on the floor. RN assured patient that her intestines are in the correct location, and that her vital signs were stable. RN assessed pain - patient denied pain. RN informed MD Dolores FrameSung.

## 2016-07-07 ENCOUNTER — Observation Stay
Admit: 2016-07-07 | Discharge: 2016-07-07 | Disposition: A | Discharge status: Home / Self Care | Payer: Medicare (Managed Care) | Attending: Physician Assistant | Admitting: Physician Assistant

## 2016-07-07 LAB — GLUCOSE, CAPILLARY
Glucose-Capillary: 110 mg/dL — ABNORMAL HIGH (ref 65–99)
Glucose-Capillary: 88 mg/dL (ref 65–99)
Glucose-Capillary: 82 mg/dL (ref 65–99)

## 2016-07-07 LAB — ECHOCARDIOGRAM LIMITED
Height: 70 in
Weight: 3200 oz

## 2016-07-07 LAB — BASIC METABOLIC PANEL
Anion gap: 8 (ref 5–15)
BUN: 7 mg/dL (ref 6–20)
CO2: 30 mmol/L (ref 22–32)
Calcium: 8.9 mg/dL (ref 8.9–10.3)
Chloride: 100 mmol/L — ABNORMAL LOW (ref 101–111)
Creatinine, Ser: 0.63 mg/dL (ref 0.44–1.00)
GFR calc Af Amer: >60 mL/min (ref >60)
GFR calc non Af Amer: >60 mL/min (ref >60)
Glucose, Bld: 83 mg/dL (ref 65–99)
Potassium: 3.4 mmol/L — ABNORMAL LOW (ref 3.5–5.1)
Sodium: 138 mmol/L (ref 135–145)

## 2016-07-07 LAB — ECHOCARDIOGRAM COMPLETE
Height: 70 in
Weight: 3206.4 oz

## 2016-07-07 LAB — HEMOGLOBIN A1C
Hgb A1c MFr Bld: 5.7 % — ABNORMAL HIGH (ref 4.8–5.6)
Mean Plasma Glucose: 117 mg/dL

## 2016-07-07 MED ORDER — METOPROLOL TARTRATE 25 MG PO TABS: 25.0000 mg | ORAL_TABLET | Freq: Two times a day (BID) | ORAL | 1 refills | Status: DC

## 2016-07-07 MED ORDER — PERFLUTREN LIPID MICROSPHERE
1.0000 mL | INTRAVENOUS | Status: AC | PRN
  Filled 2016-07-07: qty 10

## 2016-07-07 NOTE — Progress Notes (Signed)
Per Dr. Okey DupreEnd and Dr. Cherlynn KaiserSainani, Echo is WDL and patient can be discharged. Family is checking to see if it's too late for [facility] to pick her up and if so EMS would be needed. Nann, CM aware of possibility of needing EMS transfer sheet.

## 2016-07-07 NOTE — Progress Notes (Signed)
*  PRELIMINARY RESULTS* Echocardiogram 2D Echocardiogram has been performed.  Jessica [name], Jessica [name] 07/07/2016, 2:44 PM

## 2016-07-07 NOTE — Progress Notes (Signed)
Per Dr. Cherlynn KaiserSainani, patient to discharge pending echo results. [facility] nurse at bedside talking to family to arrange transportation.

## 2016-07-07 NOTE — Progress Notes (Signed)
Improved heart rate as metabolic abnormalities have improved as well as less agitation secondary to visual hallucinations. Echocardiogram pending to evaluate for structural abnormalities. If echo is normal no further inpatient cardiac evaluation is needed.

## 2016-07-07 NOTE — Progress Notes (Signed)
11:45 - Family aware that we are waiting for echo results prior to discharge. Spoke with Alycia RossettiRyan, said it should be read around lunch time.   **12:15 - [facility] was on the way to pick up patient. Informed her that we were still waiting on test results and timing was uncertain.   **13:00 Transportation here, however, it appears that echo has been re-ordered for better viewing. Son updated.

## 2016-07-07 NOTE — Discharge Summary (Signed)
[employer] - [place] at [facility]   [name]    MR#:  [id number]  DATE OF BIRTH:  11-Jan-1946  DATE OF ADMISSION:  07/06/2016 ADMITTING PHYSICIAN: Arnaldo Natal, MD  DATE OF DISCHARGE: 07/07/2016  PRIMARY CARE PHYSICIAN: Crawford Givens, MD    ADMISSION DIAGNOSIS:  Hallucinations [R44.3] Tachyarrhythmia [R00.0]  DISCHARGE DIAGNOSIS:  Principal Problem:   Hallucinations Active Problems:   Hypokalemia   Sinus tachycardia   SECONDARY DIAGNOSIS:   Past Medical History:  Diagnosis Date  . Chronic pain   . Depression   . HLD (hyperlipidemia)   . HTN (hypertension)   . Hypothyroidism   . Morbid obesity (HCC)   . Recurrent UTI   . Spinal stenosis   . Visual hallucinations     HOSPITAL COURSE:   71 year old female with past medical history of morbid obesity, hypertension, hyperlipidemia, chronic pain, depression, spinal stenosis, history of previous UTI, history of hallucinations or presents to the hospital due to visual hallucinations and also having episodes of tachycardia.  1. Sinus tachycardia-etiology unclear. Patient had episodes where her heart rate goes up to the 120s to 130s and she was clinically asymptomatic with it.  -Suspected to be secondary to hypokalemia and potassium has been supplemented and now normalized.  -No evidence of metabolic or infectious source presently. Appreciate cardiology input, started on low dose metoprolol and will be discharged on that.  Echo results are still pending.   2. History of previous visual hallucinations-asymptomatic now, follow up with Psych as outpatient.  -Continue Effexor, trazodone. Was taken off her Baclofen while in hospital which likely needs to be tapered.  To be discussed w/ PCP.   3. Hypokalemia - improved and resolved w/ supplementation.   4. Neuropathy - she will cont. Lyrica.   5. Hypothyroidism - she will cont. Synthroid - TSH normal.   6. HTN - she will cont.  Norvasc  7. Hyperlipidemia - she will cont. Atorvastatin.   Pt. Will cont. Follow up with the [facility].   DISCHARGE CONDITIONS:   Stable.   CONSULTS OBTAINED:  Treatment Team:  Iran Ouch, MD  DRUG ALLERGIES:   Allergies  Allergen Reactions  . Ambien [Zolpidem Tartrate] Other (See Comments)    Reaction: altered mental status  . Amoxicillin Other (See Comments)    Reaction: blisters in mouth Has patient had a PCN reaction causing immediate rash, facial/tongue/throat swelling, SOB or lightheadedness with hypotension: Yes Has patient had a PCN reaction causing severe rash involving mucus membranes or skin necrosis: No Has patient had a PCN reaction that required hospitalization No Has patient had a PCN reaction occurring within the last 10 years: Yes If all of the above answers are "NO", then may proceed with Cephalosporin use.     DISCHARGE MEDICATIONS:   Allergies as of 07/07/2016      Reactions   Ambien [zolpidem Tartrate] Other (See Comments)   Reaction: altered mental status   Amoxicillin Other (See Comments)   Reaction: blisters in mouth Has patient had a PCN reaction causing immediate rash, facial/tongue/throat swelling, SOB or lightheadedness with hypotension: Yes Has patient had a PCN reaction causing severe rash involving mucus membranes or skin necrosis: No Has patient had a PCN reaction that required hospitalization No Has patient had a PCN reaction occurring within the last 10 years: Yes If all of the above answers are "NO", then may proceed with Cephalosporin use.      Medication List    TAKE these  medications   amlodipine-atorvastatin 2.5-10 MG tablet Commonly known as:  CADUET Take 1 tablet by mouth daily.   aspirin EC 81 MG tablet Take 81 mg by mouth daily.   baclofen 20 MG tablet Commonly known as:  LIORESAL Take 20 mg by mouth 2 (two) times daily.   baclofen 10 MG tablet Commonly known as:  LIORESAL Take 15 mg by mouth daily at 2  PM.   docusate sodium 100 MG capsule Commonly known as:  COLACE Take 100 mg by mouth 2 (two) times daily.   furosemide 20 MG tablet Commonly known as:  LASIX Take 20 mg by mouth daily.   hydrOXYzine 25 MG tablet Commonly known as:  ATARAX/VISTARIL Take 25 mg by mouth 2 (two) times daily as needed for itching.   levothyroxine 150 MCG tablet Commonly known as:  SYNTHROID, LEVOTHROID Take 150 mcg by mouth daily before breakfast.   metoprolol tartrate 25 MG tablet Commonly known as:  LOPRESSOR Take 1 tablet (25 mg total) by mouth 2 (two) times daily.   potassium chloride SA 20 MEQ tablet Commonly known as:  K-DUR,KLOR-CON Take 20 mEq by mouth daily. Take with 10 mEq tab for a total of 30 mEq   potassium chloride 10 MEQ tablet Commonly known as:  K-DUR,KLOR-CON Take 10 mEq by mouth daily. Take with 20 mEq tab for a total of 30 mEq   pregabalin 150 MG capsule Commonly known as:  LYRICA Take 150 mg by mouth 3 (three) times daily.   senna-docusate 8.6-50 MG tablet Commonly known as:  Senokot-S Take 2 tablets by mouth daily.   Simethicone 180 MG Caps Take 2 capsules by mouth 3 (three) times daily.   traZODone 100 MG tablet Commonly known as:  DESYREL Take 100 mg by mouth at bedtime.   Venlafaxine HCl 225 MG Tb24 Take 225 mg by mouth daily.   vitamin B-12 1000 MCG tablet Commonly known as:  CYANOCOBALAMIN Take 1,000 mcg by mouth daily.         DISCHARGE INSTRUCTIONS:   DIET:  Cardiac diet  DISCHARGE CONDITION:  Stable  ACTIVITY:  Activity as tolerated  OXYGEN:  Home Oxygen: No.   Oxygen Delivery: room air  DISCHARGE LOCATION:  home   If you experience worsening of your admission symptoms, develop shortness of breath, life threatening emergency, suicidal or homicidal thoughts you must seek medical attention immediately by calling 911 or calling your MD immediately  if symptoms less severe.  You Must read complete instructions/literature along with  all the possible adverse reactions/side effects for all the Medicines you take and that have been prescribed to you. Take any new Medicines after you have completely understood and accpet all the possible adverse reactions/side effects.   Please note  You were cared for by a hospitalist during your hospital stay. If you have any questions about your discharge medications or the care you received while you were in the hospital after you are discharged, you can call the unit and asked to speak with the hospitalist on call if the hospitalist that took care of you is not available. Once you are discharged, your primary care physician will handle any further medical issues. Please note that NO REFILLS for any discharge medications will be authorized once you are discharged, as it is imperative that you return to your primary care physician (or establish a relationship with a primary care physician if you do not have one) for your aftercare needs so that they can reassess your need  for medications and monitor your lab values.     Today   HR Stable.  Potassium level improved.  Son at bedside. NO Hallucinations presently.    VITAL SIGNS:  Blood pressure (!) 130/45, pulse 60, temperature 98.2 F (36.8 C), temperature source Oral, resp. rate 16, height 5\' 10"  (1.778 m), weight 90.7 kg (200 lb), SpO2 92 %.  I/O:    Intake/Output Summary (Last 24 hours) at 07/07/16 1428 Last data filed at 07/07/16 1020  Gross per 24 hour  Intake              480 ml  Output                0 ml  Net              480 ml    PHYSICAL EXAMINATION:  GENERAL:  71 y.o.-year-old obese patient lying in the bed in no acute distress.  EYES: Pupils equal, round, reactive to light and accommodation. No scleral icterus. Extraocular muscles intact.  HEENT: Head atraumatic, normocephalic. Oropharynx and nasopharynx clear.  NECK:  Supple, no jugular venous distention. No thyroid enlargement, no tenderness.  LUNGS: Normal breath  sounds bilaterally, no wheezing, rales,rhonchi. No use of accessory muscles of respiration.  CARDIOVASCULAR: S1, S2 normal. No murmurs, rubs, or gallops.  ABDOMEN: Soft, non-tender, non-distended. Bowel sounds present. No organomegaly or mass.  EXTREMITIES: No pedal edema, cyanosis, or clubbing.  NEUROLOGIC: Cranial nerves II through XII are intact. No focal motor or sensory defecits b/l. Globally weak.  PSYCHIATRIC: The patient is alert and oriented x 3. Flat affect. SKIN: No obvious rash, lesion, or ulcer.   DATA REVIEW:   CBC  Recent Labs Lab 07/06/16 0156  WBC 9.3  HGB 15.3  HCT 44.9  PLT 297    Chemistries   Recent Labs Lab 07/06/16 0156  07/07/16 0510  NA 136  --  138  K 3.2*  < > 3.4*  CL 98*  --  100*  CO2 29  --  30  GLUCOSE 129*  --  83  BUN 8  --  7  CREATININE 0.59  --  0.63  CALCIUM 9.5  --  8.9  AST 44*  --   --   ALT 26  --   --   ALKPHOS 119  --   --   BILITOT 0.8  --   --   < > = values in this interval not displayed.  Cardiac Enzymes  Recent Labs Lab 07/06/16 0156  TROPONINI <0.03      RADIOLOGY:  Ct Head Wo Contrast  Result Date: 07/06/2016 CLINICAL DATA:  Acute onset of hallucinations.  Initial encounter. EXAM: CT HEAD WITHOUT CONTRAST TECHNIQUE: Contiguous axial images were obtained from the base of the skull through the vertex without intravenous contrast. COMPARISON:  CT of the head performed 10/24/2009 FINDINGS: Brain: No evidence of acute infarction, hemorrhage, hydrocephalus, extra-axial collection or mass lesion/mass effect. Prominence of the ventricles and sulci reflects mild cortical volume loss. Mild cerebellar atrophy is noted. Scattered periventricular and subcortical white matter change likely reflects small vessel ischemic microangiopathy. The brainstem and fourth ventricle are within normal limits. The basal ganglia are unremarkable in appearance. The cerebral hemispheres demonstrate grossly normal gray-white differentiation. No  mass effect or midline shift is seen. Vascular: No hyperdense vessel or unexpected calcification. Skull: There is no evidence of fracture; visualized osseous structures are unremarkable in appearance. Sinuses/Orbits: The orbits are within normal limits. The paranasal sinuses and  mastoid air cells are well-aerated. Other: No significant soft tissue abnormalities are seen. IMPRESSION: 1. No acute intracranial pathology seen on CT. 2. Mild cortical volume loss and scattered small vessel ischemic microangiopathy. Electronically Signed   By: Roanna Raider M.D.   On: 07/06/2016 02:46   Dg Chest Port 1 View  Result Date: 07/06/2016 CLINICAL DATA:  Acute onset of hallucinations.  Initial encounter. EXAM: PORTABLE CHEST 1 VIEW COMPARISON:  Chest radiograph performed 01/07/2011 FINDINGS: The lungs are well-aerated. Minimal left basilar scarring is noted. There is no evidence of pleural effusion or pneumothorax. The cardiomediastinal silhouette is mildly enlarged. No acute osseous abnormalities are seen. IMPRESSION: Mild cardiomegaly.  Lungs remain grossly clear. Electronically Signed   By: Roanna Raider M.D.   On: 07/06/2016 02:40      Management plans discussed with the patient, family and they are in agreement.  CODE STATUS:     Code Status Orders        Start     Ordered   07/06/16 0750  Full code  Continuous     07/06/16 0749    Code Status History    Date Active Date Inactive Code Status Order ID Comments User Context   This patient has a current code status but no historical code status.      TOTAL TIME TAKING CARE OF THIS PATIENT: 40 minutes.    Houston Siren M.D on 07/07/2016 at 2:28 PM  Between 7am to 6pm - Pager - [phone]  After 6pm go to [email or web address] - Social research officer, government  [facility] [place] [facility]  Office  [phone]  CC: Primary care physician; Crawford Givens, MD

## 2016-07-07 NOTE — Progress Notes (Signed)
[facility] unable to pick [facility] patient. EMS called for transport. Husband went home, son staying with patient until EMS arrives. AVS and printed prescription given to son. IV and telemetry to be removed with EMS arrives. Patient resting quietly, no complaints.

## 2016-07-08 ENCOUNTER — Telehealth: Payer: Self-pay | Admitting: *Deleted

## 2016-07-08 NOTE — Telephone Encounter (Signed)
Lm with pts husband requesting return call to complete TCM and confirm hosp f/u appt  

## 2016-07-09 NOTE — Telephone Encounter (Signed)
Lm requesting return call to complete TCM and confirm hosp f/u appt  

## 2016-07-14 ENCOUNTER — Ambulatory Visit: Payer: Medicare (Managed Care) | Admitting: Family Medicine

## 2016-08-14 ENCOUNTER — Emergency Department: Payer: Medicare Other

## 2016-08-14 ENCOUNTER — Observation Stay
Admission: EM | Admit: 2016-08-14 | Discharge: 2016-08-15 | Disposition: A | Discharge status: Home / Self Care | Payer: Medicare Other | Attending: Internal Medicine | Admitting: Internal Medicine

## 2016-08-14 DIAGNOSIS — G8220 Paraplegia, unspecified: Secondary | ICD-10-CM | POA: Diagnosis not present

## 2016-08-14 DIAGNOSIS — Z993 Dependence on wheelchair: Secondary | ICD-10-CM | POA: Insufficient documentation

## 2016-08-14 DIAGNOSIS — N390 Urinary tract infection, site not specified: Secondary | ICD-10-CM | POA: Insufficient documentation

## 2016-08-14 DIAGNOSIS — G8929 Other chronic pain: Secondary | ICD-10-CM | POA: Insufficient documentation

## 2016-08-14 DIAGNOSIS — Z880 Allergy status to penicillin: Secondary | ICD-10-CM | POA: Insufficient documentation

## 2016-08-14 DIAGNOSIS — Z7982 Long term (current) use of aspirin: Secondary | ICD-10-CM | POA: Insufficient documentation

## 2016-08-14 DIAGNOSIS — G9341 Metabolic encephalopathy: Secondary | ICD-10-CM | POA: Diagnosis not present

## 2016-08-14 DIAGNOSIS — M869 Osteomyelitis, unspecified: Secondary | ICD-10-CM

## 2016-08-14 DIAGNOSIS — L600 Ingrowing nail: Secondary | ICD-10-CM | POA: Insufficient documentation

## 2016-08-14 DIAGNOSIS — K759 Inflammatory liver disease, unspecified: Secondary | ICD-10-CM | POA: Insufficient documentation

## 2016-08-14 DIAGNOSIS — Z79899 Other long term (current) drug therapy: Secondary | ICD-10-CM | POA: Insufficient documentation

## 2016-08-14 DIAGNOSIS — R410 Disorientation, unspecified: Secondary | ICD-10-CM

## 2016-08-14 DIAGNOSIS — Z7401 Bed confinement status: Secondary | ICD-10-CM | POA: Insufficient documentation

## 2016-08-14 DIAGNOSIS — E785 Hyperlipidemia, unspecified: Secondary | ICD-10-CM | POA: Diagnosis not present

## 2016-08-14 DIAGNOSIS — F329 Major depressive disorder, single episode, unspecified: Secondary | ICD-10-CM | POA: Diagnosis not present

## 2016-08-14 DIAGNOSIS — E039 Hypothyroidism, unspecified: Secondary | ICD-10-CM | POA: Diagnosis not present

## 2016-08-14 DIAGNOSIS — L03039 Cellulitis of unspecified toe: Secondary | ICD-10-CM | POA: Insufficient documentation

## 2016-08-14 DIAGNOSIS — G9340 Encephalopathy, unspecified: Secondary | ICD-10-CM | POA: Diagnosis present

## 2016-08-14 DIAGNOSIS — R8271 Bacteriuria: Secondary | ICD-10-CM

## 2016-08-14 DIAGNOSIS — I10 Essential (primary) hypertension: Secondary | ICD-10-CM | POA: Insufficient documentation

## 2016-08-14 DIAGNOSIS — M4800 Spinal stenosis, site unspecified: Secondary | ICD-10-CM | POA: Insufficient documentation

## 2016-08-14 LAB — URINALYSIS, COMPLETE (UACMP) WITH MICROSCOPIC
Bilirubin Urine: NEGATIVE
Glucose, UA: NEGATIVE mg/dL
Hgb urine dipstick: NEGATIVE
Ketones, ur: NEGATIVE mg/dL
Nitrite: NEGATIVE
Protein, ur: NEGATIVE mg/dL
Specific Gravity, Urine: 1.015 (ref 1.005–1.030)
pH: 5 (ref 5.0–8.0)

## 2016-08-14 LAB — COMPREHENSIVE METABOLIC PANEL
ALT: 22 U/L (ref 14–54)
AST: 32 U/L (ref 15–41)
Albumin: 3.6 g/dL (ref 3.5–5.0)
Alkaline Phosphatase: 104 U/L (ref 38–126)
Anion gap: 9 (ref 5–15)
BUN: 12 mg/dL (ref 6–20)
CO2: 28 mmol/L (ref 22–32)
Calcium: 9.5 mg/dL (ref 8.9–10.3)
Chloride: 99 mmol/L — ABNORMAL LOW (ref 101–111)
Creatinine, Ser: 0.59 mg/dL (ref 0.44–1.00)
GFR calc Af Amer: >60 mL/min (ref >60)
GFR calc non Af Amer: >60 mL/min (ref >60)
Glucose, Bld: 116 mg/dL — ABNORMAL HIGH (ref 65–99)
Potassium: 3.9 mmol/L (ref 3.5–5.1)
Sodium: 136 mmol/L (ref 135–145)
Total Bilirubin: 0.8 mg/dL (ref 0.3–1.2)
Total Protein: 7.5 g/dL (ref 6.5–8.1)

## 2016-08-14 LAB — CBC WITH DIFFERENTIAL/PLATELET
Basophils Absolute: 0.2 10*3/uL — ABNORMAL HIGH (ref 0–0.1)
Basophils Relative: 1 %
Eosinophils Absolute: 0.2 10*3/uL (ref 0–0.7)
Eosinophils Relative: 2 %
HCT: 45.9 % (ref 35.0–47.0)
Hemoglobin: 15.3 g/dL (ref 12.0–16.0)
Lymphocytes Relative: 18 %
Lymphs Abs: 2.2 10*3/uL (ref 1.0–3.6)
MCH: 26.2 pg (ref 26.0–34.0)
MCHC: 33.5 g/dL (ref 32.0–36.0)
MCV: 78.3 fL — ABNORMAL LOW (ref 80.0–100.0)
Monocytes Absolute: 0.9 10*3/uL (ref 0.2–0.9)
Monocytes Relative: 7 %
Neutro Abs: 8.8 10*3/uL — ABNORMAL HIGH (ref 1.4–6.5)
Neutrophils Relative %: 72 %
Platelets: 294 10*3/uL (ref 150–440)
RBC: 5.86 MIL/uL — ABNORMAL HIGH (ref 3.80–5.20)
RDW: 14.1 % (ref 11.5–14.5)
WBC: 12.3 10*3/uL — ABNORMAL HIGH (ref 3.6–11.0)

## 2016-08-14 LAB — GLUCOSE, CAPILLARY: Glucose-Capillary: 109 mg/dL — ABNORMAL HIGH (ref 65–99)

## 2016-08-14 LAB — PROTIME-INR
INR: 1.03
Prothrombin Time: 13.5 seconds (ref 11.4–15.2)

## 2016-08-14 LAB — AMMONIA: Ammonia: 14 umol/L (ref 9–35)

## 2016-08-14 LAB — TROPONIN I: Troponin I: <0.03 ng/mL (ref <0.03)

## 2016-08-14 MED ORDER — NITROGLYCERIN 0.4 MG SL SUBL: 0.4000 mg | SUBLINGUAL_TABLET | SUBLINGUAL | Status: DC

## 2016-08-14 MED ORDER — VANCOMYCIN HCL IN DEXTROSE 1-5 GM/200ML-% IV SOLN
1000.0000 mg | Freq: Once | INTRAVENOUS | Status: AC
  Administered 2016-08-14: 1000 mg via INTRAVENOUS
  Filled 2016-08-14: qty 200

## 2016-08-14 MED ORDER — SODIUM CHLORIDE 0.9 % IV BOLUS (SEPSIS)
500.0000 mL | Freq: Once | INTRAVENOUS | Status: AC
  Administered 2016-08-14: 500 mL via INTRAVENOUS

## 2016-08-14 MED ORDER — FOSFOMYCIN TROMETHAMINE 3 G PO PACK: 3.0000 g | PACK | ORAL | Status: DC

## 2016-08-14 MED ORDER — SODIUM CHLORIDE 0.9 % IV SOLN
INTRAVENOUS | Status: DC
  Administered 2016-08-14: 22:00:00 via INTRAVENOUS

## 2016-08-14 MED ORDER — ACETAMINOPHEN 325 MG PO TABS: 650.0000 mg | ORAL_TABLET | Freq: Four times a day (QID) | ORAL | Status: DC | PRN

## 2016-08-14 MED ORDER — ACETAMINOPHEN 650 MG RE SUPP: 650.0000 mg | Freq: Four times a day (QID) | RECTAL | Status: DC | PRN

## 2016-08-14 MED ORDER — DOCUSATE SODIUM 100 MG PO CAPS
100.0000 mg | ORAL_CAPSULE | Freq: Two times a day (BID) | ORAL | Status: DC
  Administered 2016-08-15: 09:00:00 100 mg via ORAL
  Filled 2016-08-14 (×2): qty 1

## 2016-08-14 MED ORDER — LEVOTHYROXINE SODIUM 50 MCG PO TABS
150.0000 ug | ORAL_TABLET | Freq: Every day | ORAL | Status: DC
  Filled 2016-08-14: qty 1

## 2016-08-14 MED ORDER — VANCOMYCIN HCL IN DEXTROSE 1-5 GM/200ML-% IV SOLN
1000.0000 mg | Freq: Two times a day (BID) | INTRAVENOUS | Status: DC
  Administered 2016-08-15: 1000 mg via INTRAVENOUS
  Filled 2016-08-14 (×3): qty 200

## 2016-08-14 MED ORDER — ASPIRIN EC 81 MG PO TBEC
81.0000 mg | DELAYED_RELEASE_TABLET | Freq: Every day | ORAL | Status: DC
  Administered 2016-08-15: 81 mg via ORAL
  Filled 2016-08-14: qty 1

## 2016-08-14 MED ORDER — VENLAFAXINE HCL ER 75 MG PO CP24
225.0000 mg | ORAL_CAPSULE | Freq: Every day | ORAL | Status: DC
  Administered 2016-08-15: 225 mg via ORAL
  Filled 2016-08-14: qty 3

## 2016-08-14 MED ORDER — LEVOTHYROXINE SODIUM 150 MCG PO TABS
150.0000 ug | ORAL_TABLET | Freq: Every day | ORAL | Status: DC
  Administered 2016-08-15: 150 ug via ORAL
  Filled 2016-08-14: qty 1

## 2016-08-14 MED ORDER — SENNOSIDES-DOCUSATE SODIUM 8.6-50 MG PO TABS
2.0000 | ORAL_TABLET | Freq: Every day | ORAL | Status: DC
  Administered 2016-08-15: 09:00:00 2 via ORAL
  Filled 2016-08-14: qty 2

## 2016-08-14 MED ORDER — ENOXAPARIN SODIUM 40 MG/0.4ML ~~LOC~~ SOLN
40.0000 mg | SUBCUTANEOUS | Status: DC
  Administered 2016-08-14: 22:00:00 40 mg via SUBCUTANEOUS
  Filled 2016-08-14: qty 0.4

## 2016-08-14 MED ORDER — DEXTROSE 5 % IV SOLN
2.0000 g | Freq: Two times a day (BID) | INTRAVENOUS | Status: DC
  Administered 2016-08-14 – 2016-08-15 (×2): 2 g via INTRAVENOUS
  Filled 2016-08-14 (×6): qty 2

## 2016-08-14 MED ORDER — CIPROFLOXACIN IN D5W 400 MG/200ML IV SOLN
400.0000 mg | Freq: Once | INTRAVENOUS | Status: AC
  Administered 2016-08-14: 400 mg via INTRAVENOUS
  Filled 2016-08-14: qty 200

## 2016-08-14 MED ORDER — VITAMIN B-12 1000 MCG PO TABS
1000.0000 ug | ORAL_TABLET | Freq: Every day | ORAL | Status: DC
  Administered 2016-08-15: 09:00:00 1000 ug via ORAL
  Filled 2016-08-14: qty 1

## 2016-08-14 NOTE — ED Provider Notes (Signed)
[facility] Emergency Department Provider Note ____________________________________________   First MD Initiated Contact with Patient 08/14/16 1508     (approximate)  I have reviewed the triage vital signs and the nursing notes.  HISTORY  Chief Complaint Hallucinations  EM caveat: The patient is confused. Available history of present illness and review of systems is limited, but comes from the patient's son who is present  HPI Jessica [name] is a 71 y.o. female here for evaluation of worsening confusion.  The patient's son reports that the patient has been acting strangely for a couple of days, but notably today she did not recognize her own dog, she was upset with her husband which is unusual, and she is seemed confused and "off". He reports this happened a few months ago, and seemed to get a little bit better but is slowly been coming back over the last month's time. He is also suspicious she might have a "urinary infection" disease her deck and cause similar  The patient herself denies being in pain, but is not sure why she is here or where she is.  She has chronic weakness in her arms and is unable to move her legs at baseline due to previous spinal stenosis   Past Medical History:  Diagnosis Date  . Chronic pain   . Depression   . HLD (hyperlipidemia)   . HTN (hypertension)   . Hypothyroidism   . Morbid obesity (HCC)   . Recurrent UTI   . Spinal stenosis   . Visual hallucinations     Patient Active Problem List   Diagnosis Date Noted  . Hallucinations 07/06/2016  . Hypokalemia 07/06/2016  . Sinus tachycardia 07/06/2016  . RECTAL PAIN 09/04/2009  . CONSTIPATION 07/08/2009  . DEEP VENOUS THROMBOPHLEBITIS, BILATERAL 01/07/2009  . PALLOR 01/07/2009  . DIFFICULTY IN [facility] 11/22/2008  . ABDOMINAL OR PELVIC SWELLING MASS OR LUMP LLQ 10/01/2008  . 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 HYPERLIPIDEMIA 12/10/2006  . ANXIETY 12/10/2006  .  DEPRESSION 12/10/2006  . HYPERTENSION 12/10/2006  . OSTEOARTHRITIS 12/10/2006  . HYPOTHYROIDISM 09/22/2006  . HYPERCHOLESTEROLEMIA 09/22/2006  . ECZEMA 09/22/2006    Past Surgical History:  Procedure Laterality Date  . BACK SURGERY      Prior to Admission medications   Medication Sig Start Date End Date Taking? Authorizing Provider  amlodipine-atorvastatin (CADUET) 2.5-10 MG tablet Take 1 tablet by mouth daily.   Yes Historical Provider, MD  aspirin EC 81 MG tablet Take 81 mg by mouth daily.   Yes Historical Provider, MD  baclofen (LIORESAL) 20 MG tablet Take 5-20 mg by mouth See admin instructions. tk 20mg  qam, 5mg  @ 1400, and 20mg  qpm   Yes Historical Provider, MD  docusate sodium (COLACE) 100 MG capsule Take 100 mg by mouth 2 (two) times daily.   Yes Historical Provider, MD  furosemide (LASIX) 20 MG tablet Take 20 mg by mouth daily.   Yes Historical Provider, MD  hydrOXYzine (ATARAX/VISTARIL) 25 MG tablet Take 25 mg by mouth 2 (two) times daily as needed for itching.   Yes Historical Provider, MD  levothyroxine (SYNTHROID, LEVOTHROID) 150 MCG tablet Take 150 mcg by mouth daily before breakfast.   Yes Historical Provider, MD  metoprolol tartrate (LOPRESSOR) 25 MG tablet Take 1 tablet (25 mg total) by mouth 2 (two) times daily. 07/07/16  Yes Houston Siren, MD  potassium chloride SA (K-DUR,KLOR-CON) 20 MEQ tablet Take 30 mEq by mouth daily.    Yes Historical Provider, MD  pregabalin (  LYRICA) 150 MG capsule Take 150 mg by mouth 3 (three) times daily.   Yes Historical Provider, MD  senna-docusate (SENOKOT-S) 8.6-50 MG tablet Take 2 tablets by mouth daily.   Yes Historical Provider, MD  Simethicone 180 MG CAPS Take 2 capsules by mouth 3 (three) times daily.   Yes Historical Provider, MD  traZODone (DESYREL) 100 MG tablet Take 100 mg by mouth at bedtime.   Yes Historical Provider, MD  Venlafaxine HCl 225 MG TB24 Take 225 mg by mouth daily.   Yes Historical Provider, MD  vitamin B-12  (CYANOCOBALAMIN) 1000 MCG tablet Take 1,000 mcg by mouth daily.   Yes Historical Provider, MD    Allergies Ambien [zolpidem tartrate] and Amoxicillin  Family History  Problem Relation Age of Onset  . Heart disease Father     Social History Social History  Substance Use Topics  . Smoking status: Never Smoker  . Smokeless tobacco: Current User    Types: Snuff  . Alcohol use No    Review of Systems  EM caveat ____________________________________________   PHYSICAL EXAM:  VITAL SIGNS: ED Triage Vitals  Enc Vitals Group     BP 08/14/16 1457 139/70     Pulse Rate 08/14/16 1457 60     Resp 08/14/16 1457 15     Temp 08/14/16 1457 97.7 F (36.5 C)     Temp Source 08/14/16 1457 Oral     SpO2 08/14/16 1457 96 %     Weight 08/14/16 1502 200 lb (90.7 kg)     Height 08/14/16 1502 5\' 10"  (1.778 m)     Head Circumference --      Peak Flow --      Pain Score --      Pain Loc --      Pain Edu? --      Excl. in GC? --     Constitutional:Somnolent, awakens easily to voice. Follows commands, but is disoriented not certain of her location but does recognize her son. Does not know the year or day Eyes: Conjunctivae are normal. PERRL. EOMI. Head: Atraumatic. Nose: No congestion/rhinnorhea. Mouth/Throat: Mucous membranes are dry.  Oropharynx non-erythematous. Neck: No stridor.  No cervical tenderness or rigidity Cardiovascular: Normal rate, regular rhythm. Grossly normal heart sounds.  Good peripheral circulation. Respiratory: Normal respiratory effort.  No retractions. Lungs CTAB. Gastrointestinal: Soft and nontender. No distention.  Musculoskeletal: No lower extremity tenderness nor edema.  No joint effusions.Patient does not move either lower extremity, family reports this is chronic. She has weakness in the upper arms about 4-5 strength bilaterally with some slight almost flexion type contractures in the hands which family also reports are chronic due to her previous spinal  stenosis. Neurologic:  Somnolent, when she does speak it is clear but only briefly.  Skin:  Skin is warm, dry and intact. No rash noted. Psychiatric: Mood and affect are very calm and flat.  ____________________________________________   LABS (all labs ordered are listed, but only abnormal results are displayed)  Labs Reviewed  CBC WITH DIFFERENTIAL/PLATELET - Abnormal; Notable for the following:       Result Value   WBC 12.3 (*)    RBC 5.86 (*)    MCV 78.3 (*)    Neutro Abs 8.8 (*)    Basophils Absolute 0.2 (*)    All other components within normal limits  COMPREHENSIVE METABOLIC PANEL - Abnormal; Notable for the following:    Chloride 99 (*)    Glucose, Bld 116 (*)  All other components within normal limits  URINALYSIS, COMPLETE (UACMP) WITH MICROSCOPIC - Abnormal; Notable for the following:    Color, Urine YELLOW (*)    APPearance HAZY (*)    Leukocytes, UA SMALL (*)    Bacteria, UA RARE (*)    Squamous Epithelial / LPF 0-5 (*)    All other components within normal limits  GLUCOSE, CAPILLARY - Abnormal; Notable for the following:    Glucose-Capillary 109 (*)    All other components within normal limits  URINE CULTURE  PROTIME-INR  TROPONIN I  AMMONIA  CBG MONITORING, ED   ____________________________________________  EKG  Reviewed and interrupted me in 1900 Heart rate 55 Care is 170 QTc 500 Sinus rhythm, right bundle-branch block without evidence of notable ischemic change ____________________________________________  RADIOLOGY  Ct Head Wo Contrast  Result Date: 08/14/2016 CLINICAL DATA:  Per EMS pt lives at home and was reported reaching out for things that were not there and stating that her family dog did not belong to her. Confusion. EXAM: CT HEAD WITHOUT CONTRAST TECHNIQUE: Contiguous axial images were obtained from the base of the skull through the vertex without intravenous contrast. COMPARISON:  07/06/2016 FINDINGS: Brain: No evidence of acute  infarction, hemorrhage, extra-axial collection, ventriculomegaly, or mass effect. Generalized cerebral atrophy. Periventricular white matter low attenuation likely secondary to microangiopathy. Vascular: Cerebrovascular atherosclerotic calcifications are noted. Skull: Negative for fracture or focal lesion. Sinuses/Orbits: Visualized portions of the orbits are unremarkable. Visualized portions of the paranasal sinuses and mastoid air cells are unremarkable. Other: None. IMPRESSION: 1. No acute intracranial pathology. 2. Chronic microvascular disease and cerebral atrophy. Electronically Signed   By: Elige KoHetal  Patel   On: 08/14/2016 16:53   Dg Chest Portable 1 View  Result Date: 08/14/2016 CLINICAL DATA:  71 year old presenting with acute confusion. Current history of hypertension. EXAM: PORTABLE CHEST 1 VIEW COMPARISON:  07/06/2016, 01/07/2011 and earlier. FINDINGS: Cardiac silhouette mildly enlarged for AP portable technique, unchanged. Suboptimal inspiration which accounts for mild bibasilar atelectasis. Lungs otherwise clear. Pulmonary vascularity normal. Thoracic dextroscoliosis again noted. IMPRESSION: Suboptimal inspiration accounts for mild bibasilar atelectasis. No acute cardiopulmonary disease otherwise. Stable mild cardiomegaly without pulmonary edema. Electronically Signed   By: Hulan Saashomas  Lawrence M.D.   On: 08/14/2016 16:08    ____________________________________________   PROCEDURES  Procedure(s) performed: None  Procedures  Critical Care performed: No  ____________________________________________   INITIAL IMPRESSION / ASSESSMENT AND PLAN / ED COURSE  Pertinent labs & imaging results that were available during my care of the patient were reviewed by me and considered in my medical decision making (see chart for details).   Vitals:   08/14/16 1700 08/14/16 1830  BP: 135/75 (!) 116/51  Pulse: (!) 56   Resp: (!) 9 20  Temp:     ----------------------------------------- 7:07 PM  on 08/14/2016 -----------------------------------------  Extensive evaluation reviewed, and this point I believe the patient may be suffering from urinary tract infection. She does have positive bacteria with associated worsening confusion, remaining labs including CT and x-rays do not demonstrate acute obvious cause. She remains in the same condition with somnolence but arousable at this time. Discussed with her family at the bedside, we'll admit, further workup under the hospitalist service. She does appear stable for admission, and I have initiated ciprofloxacin based on previous noted penicillin allergy with a sudden immediate reaction. Urine culture is sent       ____________________________________________   FINAL CLINICAL IMPRESSION(S) / ED DIAGNOSES  Final diagnoses:  Delirium  Bacteria in urine  Urinary tract infection, acute      NEW MEDICATIONS STARTED DURING THIS VISIT:  New Prescriptions   No medications on file     Note:  This document was prepared using Dragon voice recognition software and may include unintentional dictation errors.     Sharyn Creamer, MD 08/14/16 1910

## 2016-08-14 NOTE — H&P (Signed)
[facility] [place] [facility]   [name]    MR#:  [id number]  DATE OF BIRTH:  April 23, 1945  DATE OF ADMISSION:  08/14/2016  PRIMARY CARE PHYSICIAN: [facility]   REQUESTING/REFERRING PHYSICIAN: Dr. Sharyn Creamer  CHIEF COMPLAINT:   Chief Complaint  Patient presents with  . Hallucinations    HISTORY OF PRESENT ILLNESS:  Jessica [name]  is a 71 y.o. female brought in by family for altered mental status. She has been sleeping a lot. Decreased appetite. Decreased oral intake even with liquids. She's been moving her fingers in the bed and digging at the bed. She has been stating that there are fish in her bed. She was recently in the hospital and started on metoprolol for tachyarrhythmia. The patient's son states that ever since that she was placed on the metoprolol that she's been sleeping a lot. Patient was awakened from sleep and does answer some yes or no questions. She states she feels okay. Able to follow some simple commands. ER physician thought there could be a urinary tract infection and hospitalist services were contacted for further evaluation.  PAST MEDICAL HISTORY:   Past Medical History:  Diagnosis Date  . Chronic pain   . Depression   . HLD (hyperlipidemia)   . HTN (hypertension)   . Hypothyroidism   . Morbid obesity (HCC)   . Recurrent UTI   . Spinal stenosis   . Visual hallucinations     PAST SURGICAL HISTORY:   Past Surgical History:  Procedure Laterality Date  . ABDOMINAL HYSTERECTOMY    . BACK SURGERY    . HEMORROIDECTOMY      SOCIAL HISTORY:   Social History  Substance Use Topics  . Smoking status: Never Smoker  . Smokeless tobacco: Current User    Types: Snuff  . Alcohol use No    FAMILY HISTORY:   Family History  Problem Relation Age of Onset  . Heart disease Father   . Deep vein thrombosis Father     DRUG ALLERGIES:   Allergies  Allergen Reactions  . Ambien [Zolpidem  Tartrate] Other (See Comments)    Reaction: altered mental status  . Amoxicillin Other (See Comments)    Reaction: blisters in mouth Has patient had a PCN reaction causing immediate rash, facial/tongue/throat swelling, SOB or lightheadedness with hypotension: Yes Has patient had a PCN reaction causing severe rash involving mucus membranes or skin necrosis: No Has patient had a PCN reaction that required hospitalization No Has patient had a PCN reaction occurring within the last 10 years: Yes If all of the above answers are "NO", then may proceed with Cephalosporin use.     REVIEW OF SYSTEMS:   Limited with altered mental status   RESPIRATORY: No shortness of breath.  CARDIOVASCULAR: No chest pain.  GASTROINTESTINAL: No vomiting, or abdominal pain.  GENITOURINARY: No dysuria.     MEDICATIONS AT HOME:   Prior to Admission medications   Medication Sig Start Date End Date Taking? Authorizing Provider  amlodipine-atorvastatin (CADUET) 2.5-10 MG tablet Take 1 tablet by mouth daily.   Yes Historical Provider, MD  aspirin EC 81 MG tablet Take 81 mg by mouth daily.   Yes Historical Provider, MD  baclofen (LIORESAL) 20 MG tablet Take 5-20 mg by mouth See admin instructions. tk 20mg  qam, 5mg  @ 1400, and 20mg  qpm   Yes Historical Provider, MD  docusate sodium (COLACE) 100 MG capsule Take 100 mg by mouth 2 (two) times daily.  Yes Historical Provider, MD  furosemide (LASIX) 20 MG tablet Take 20 mg by mouth daily.   Yes Historical Provider, MD  hydrOXYzine (ATARAX/VISTARIL) 25 MG tablet Take 25 mg by mouth 2 (two) times daily as needed for itching.   Yes Historical Provider, MD  levothyroxine (SYNTHROID, LEVOTHROID) 150 MCG tablet Take 150 mcg by mouth daily before breakfast.   Yes Historical Provider, MD  metoprolol tartrate (LOPRESSOR) 25 MG tablet Take 1 tablet (25 mg total) by mouth 2 (two) times daily. 07/07/16  Yes Houston Siren, MD  potassium chloride SA (K-DUR,KLOR-CON) 20 MEQ tablet  Take 30 mEq by mouth daily.    Yes Historical Provider, MD  pregabalin (LYRICA) 150 MG capsule Take 150 mg by mouth 3 (three) times daily.   Yes Historical Provider, MD  senna-docusate (SENOKOT-S) 8.6-50 MG tablet Take 2 tablets by mouth daily.   Yes Historical Provider, MD  Simethicone 180 MG CAPS Take 2 capsules by mouth 3 (three) times daily.   Yes Historical Provider, MD  traZODone (DESYREL) 100 MG tablet Take 100 mg by mouth at bedtime.   Yes Historical Provider, MD  Venlafaxine HCl 225 MG TB24 Take 225 mg by mouth daily.   Yes Historical Provider, MD  vitamin B-12 (CYANOCOBALAMIN) 1000 MCG tablet Take 1,000 mcg by mouth daily.   Yes Historical Provider, MD      VITAL SIGNS:  Blood pressure 119/61, pulse (!) 56, temperature 97.7 F (36.5 C), temperature source Oral, resp. rate 15, height 5\' 10"  (1.778 m), weight 90.7 kg (200 lb), SpO2 96 %.  PHYSICAL EXAMINATION:  GENERAL:  71 y.o.-year-old patient lying in the bed with no acute distress Awakened with sternal rub  EYES: Pupils equal, round, reactive to light and accommodation. No scleral icterus. Extraocular muscles intact.  HEENT: Head atraumatic, normocephalic. Oropharynx and nasopharynx clear.  NECK:  Supple, no jugular venous distention. No thyroid enlargement, no tenderness.  LUNGS: Normal breath sounds bilaterally, no wheezing, rales,rhonchi or crepitation. No use of accessory muscles of respiration.  CARDIOVASCULAR: S1, S2 normal bradycardic. No murmurs, rubs, or gallops.  ABDOMEN: Soft, nontender, nondistended. Bowel sounds present. No organomegaly or mass.  EXTREMITIES: No pedal edema, cyanosis, or clubbing.  NEUROLOGIC: Cranial nerves II through XII are intact. Patient able to lift up both of her arms up off the bed. Legs with chronic paralysis and feet chronically flexed PSYCHIATRIC: The patient is alert after I awakened her with sternal rub.  SKIN: No rash, lesion, or ulcer.   LABORATORY PANEL:   CBC  Recent Labs Lab  08/14/16 1544  WBC 12.3*  HGB 15.3  HCT 45.9  PLT 294   ------------------------------------------------------------------------------------------------------------------  Chemistries   Recent Labs Lab 08/14/16 1544  NA 136  K 3.9  CL 99*  CO2 28  GLUCOSE 116*  BUN 12  CREATININE 0.59  CALCIUM 9.5  AST 32  ALT 22  ALKPHOS 104  BILITOT 0.8   ------------------------------------------------------------------------------------------------------------------  Cardiac Enzymes  Recent Labs Lab 08/14/16 1544  TROPONINI <0.03   ------------------------------------------------------------------------------------------------------------------  RADIOLOGY:  Ct Head Wo Contrast  Result Date: 08/14/2016 CLINICAL DATA:  Per EMS pt lives at home and was reported reaching out for things that were not there and stating that her family dog did not belong to her. Confusion. EXAM: CT HEAD WITHOUT CONTRAST TECHNIQUE: Contiguous axial images were obtained from the base of the skull through the vertex without intravenous contrast. COMPARISON:  07/06/2016 FINDINGS: Brain: No evidence of acute infarction, hemorrhage, extra-axial collection, ventriculomegaly, or mass  effect. Generalized cerebral atrophy. Periventricular white matter low attenuation likely secondary to microangiopathy. Vascular: Cerebrovascular atherosclerotic calcifications are noted. Skull: Negative for fracture or focal lesion. Sinuses/Orbits: Visualized portions of the orbits are unremarkable. Visualized portions of the paranasal sinuses and mastoid air cells are unremarkable. Other: None. IMPRESSION: 1. No acute intracranial pathology. 2. Chronic microvascular disease and cerebral atrophy. Electronically Signed   By: Elige KoHetal  Patel   On: 08/14/2016 16:53   Dg Chest Portable 1 View  Result Date: 08/14/2016 CLINICAL DATA:  71 year old presenting with acute confusion. Current history of hypertension. EXAM: PORTABLE CHEST 1 VIEW  COMPARISON:  07/06/2016, 01/07/2011 and earlier. FINDINGS: Cardiac silhouette mildly enlarged for AP portable technique, unchanged. Suboptimal inspiration which accounts for mild bibasilar atelectasis. Lungs otherwise clear. Pulmonary vascularity normal. Thoracic dextroscoliosis again noted. IMPRESSION: Suboptimal inspiration accounts for mild bibasilar atelectasis. No acute cardiopulmonary disease otherwise. Stable mild cardiomegaly without pulmonary edema. Electronically Signed   By: Hulan Saashomas  Lawrence M.D.   On: 08/14/2016 16:08   IMPRESSION AND PLAN:   1. Acute encephalopathy. Seems like the patient is overmedicated. Hold anything that can cause altered mental status including atorvastatin, baclofen, Vistaril, Lyrica, trazodone. Depending on what her mental status is after holding medications may be able to restart some of these in lower doses. 2. Left first toe ingrown toenail infection. Leukocytosis. Patient has an allergy to penicillins. ER physician ordered some Cipro. I will get more aggressive with aztreonam and vancomycin for right now. Potentially can go home on doxycycline or clindamycin. Case discussed with Dr. Orland Jarredroxler to see the patient in the morning. 3. ER physician thought there may be a urinary tract infection. I'm not so sure. Urine culture pending. 4. Essential hypertension and tachycardia arrhythmia history. May be able to start low dose Cardizem CD. But I will hold off on this since heart rate is in the 50s at this point. 5. History of spinal stenosis and leg paralysis and bedbound. Hold baclofen and Lyrica 6. Hypothyroidism unspecified continue levothyroxine and check a TSH. 7. Depression continue Effexor   All the records are reviewed and case discussed with ED provider. Management plans discussed with the patient, family and they are in agreement.  CODE STATUS: Full code  TOTAL TIME TAKING CARE OF THIS PATIENT: 55 minutes.    Alford HighlandWIETING, Arinze Rivadeneira M.D on 08/14/2016 at 7:31  PM  Between 7am to 6pm - Pager - [phone]  After 6pm call admission pager [phone]  [facility] Office  [phone]  CC: Primary care physician; [facility]

## 2016-08-14 NOTE — ED Triage Notes (Signed)
Per EMS pt lives at home and was reported reaching out for things that were not there and stating that her family dog did not belong to her.  Pt son stated that since starting Metoprolol in March she "hasnt been right".  Pt is bed bound due to spinal stenosis.  EMS stated pt has bedsores.  When EMS was taking her to the ambulance pt started cussing at her husband saying "you better not be here when I get back".  Pt is lethargic and oriented to place and time.  Pt denies any pain.

## 2016-08-14 NOTE — ED Notes (Signed)
Patient changed from a wet diaper prior to being transferred to the floor.

## 2016-08-14 NOTE — Progress Notes (Signed)
Pharmacy Antibiotic Note  Jessica [name] is a 71 y.o. female admitted on 08/14/2016 with wound infection/ingrown toenail. Pharmacy has been consulted for vancomycin and azetreonam dosing. Patient received vancomycin 1000 mg IV x 1 in ED  Plan: Begin azetreonam 2 g IV q 12 hours  Begin vancomycin 1000 mg IV q 12 hours (~6 hr stacked dosing) with goal trough 10-15. Trough to be drawn prior to 5th dose 5/6 @ 1630 Ke=0.072 t1/2=9.63 Vd=54.2 Cmin at ss estimated to be 13.4  Height: 5\' 10"  (177.8 cm) Weight: 200 lb (90.7 kg) IBW/kg (Calculated) : 68.5  Temp (24hrs), Avg:97.7 F (36.5 C), Min:97.7 F (36.5 C), Max:97.7 F (36.5 C)   Recent Labs Lab 08/14/16 1544  WBC 12.3*  CREATININE 0.59    Estimated Creatinine Clearance: 81.1 mL/min (by C-G formula based on SCr of 0.59 mg/dL).    Allergies  Allergen Reactions  . Ambien [Zolpidem Tartrate] Other (See Comments)    Reaction: altered mental status  . Amoxicillin Other (See Comments)    Reaction: blisters in mouth Has patient had a PCN reaction causing immediate rash, facial/tongue/throat swelling, SOB or lightheadedness with hypotension: Yes Has patient had a PCN reaction causing severe rash involving mucus membranes or skin necrosis: No Has patient had a PCN reaction that required hospitalization No Has patient had a PCN reaction occurring within the last 10 years: Yes If all of the above answers are "NO", then may proceed with Cephalosporin use.     Antimicrobials this admission: ciprofloxacin 5/4 >> once aztreonam 5/4 >>  Vancomycin 5/4 >>  Dose adjustments this admission:  Microbiology results:  BCx:  5/4 UCx: sent   Sputum:    MRSA PCR:   Thank you for allowing pharmacy to be a part of this patient's care.  Horris LatinoHolly Bobie Caris, PharmD Pharmacy Resident 08/14/2016 7:33 PM

## 2016-08-15 ENCOUNTER — Observation Stay: Payer: Medicare Other

## 2016-08-15 DIAGNOSIS — G9341 Metabolic encephalopathy: Secondary | ICD-10-CM | POA: Diagnosis not present

## 2016-08-15 LAB — CBC
HCT: 42.4 % (ref 35.0–47.0)
Hemoglobin: 14.4 g/dL (ref 12.0–16.0)
MCH: 26.6 pg (ref 26.0–34.0)
MCHC: 34 g/dL (ref 32.0–36.0)
MCV: 78.2 fL — ABNORMAL LOW (ref 80.0–100.0)
Platelets: 259 10*3/uL (ref 150–440)
RBC: 5.42 MIL/uL — ABNORMAL HIGH (ref 3.80–5.20)
RDW: 14 % (ref 11.5–14.5)
WBC: 9.8 10*3/uL (ref 3.6–11.0)

## 2016-08-15 LAB — BASIC METABOLIC PANEL
Anion gap: 6 (ref 5–15)
BUN: 10 mg/dL (ref 6–20)
CO2: 28 mmol/L (ref 22–32)
Calcium: 9.1 mg/dL (ref 8.9–10.3)
Chloride: 103 mmol/L (ref 101–111)
Creatinine, Ser: 0.57 mg/dL (ref 0.44–1.00)
GFR calc Af Amer: >60 mL/min (ref >60)
GFR calc non Af Amer: >60 mL/min (ref >60)
Glucose, Bld: 99 mg/dL (ref 65–99)
Potassium: 3.7 mmol/L (ref 3.5–5.1)
Sodium: 137 mmol/L (ref 135–145)

## 2016-08-15 LAB — TSH: TSH: 7.866 u[IU]/mL — ABNORMAL HIGH (ref 0.350–4.500)

## 2016-08-15 MED ORDER — AMLODIPINE BESYLATE 10 MG PO TABS
10.0000 mg | ORAL_TABLET | Freq: Every day | ORAL | Status: DC
  Administered 2016-08-15: 10 mg via ORAL
  Filled 2016-08-15: qty 1

## 2016-08-15 MED ORDER — LIDOCAINE HCL 1 % IJ SOLN
5.0000 mL | Freq: Once | INTRAMUSCULAR | Status: AC
  Administered 2016-08-15: 11:00:00 3 mL
  Filled 2016-08-15: qty 5

## 2016-08-15 MED ORDER — CIPROFLOXACIN HCL 500 MG PO TABS: 500.0000 mg | ORAL_TABLET | Freq: Two times a day (BID) | ORAL | 0 refills | Status: AC

## 2016-08-15 MED ORDER — SULFAMETHOXAZOLE-TRIMETHOPRIM 800-160 MG PO TABS: 1.0000 | ORAL_TABLET | Freq: Two times a day (BID) | ORAL | 0 refills | Status: DC

## 2016-08-15 NOTE — Progress Notes (Signed)
SLP Cancellation Note  Patient Details Name: Jessica [name] MRN: [id number] DOB: 03-10-1946   Cancelled treatment:       Reason Eval/Treat Not Completed: SLP screened, no needs identified, will sign off Reviewed chart and spoke w/nsg and pt/family. No SLP needs identified. Please re-consult if further concerns arise.    [place],[name] 08/15/2016, 2:47 PM

## 2016-08-15 NOTE — Progress Notes (Signed)
PT Cancellation Note  Patient Details Name: Jessica [name] MRN: [id number] DOB: 24-May-1945   Cancelled Treatment:    Reason Eval/Treat Not Completed: PT screened, no needs identified, will sign off.  Pt leaving imminently and is wheelchair bound at baseline.   Jessica [name] 08/15/2016, 12:17 PM   Jessica [name], PT MS Acute Rehab Dept. Number: [facility] [id number] and [facility] [phone]

## 2016-08-15 NOTE — Progress Notes (Signed)
Bed side procedure performed by Dr Orland Jarredroxler in Room 110 with said RN at the bedside, Avulsion of left great toe; right site and time out performed; dressing applied by MD; pt tolerated well; will continue to monitor pt

## 2016-08-15 NOTE — Consult Note (Addendum)
Patient Demographics  Jessica [name], is a 71 y.o. female   MRN: [id number]   DOB - 1945/11/25  Admit Date - 08/14/2016    Outpatient Primary MD for the patient is [facility]  Consult requested in the Hospital by Adrian Saran, MD, On 08/15/2016    Reason for consult infected ingrown toenail to the left hallux   With History of -  Past Medical History:  Diagnosis Date  . Chronic pain   . Depression   . HLD (hyperlipidemia)   . HTN (hypertension)   . Hypothyroidism   . Morbid obesity (HCC)   . Recurrent UTI   . Spinal stenosis   . Visual hallucinations       Past Surgical History:  Procedure Laterality Date  . ABDOMINAL HYSTERECTOMY    . BACK SURGERY    . HEMORROIDECTOMY      in for   Chief Complaint  Patient presents with  . Hallucinations     HPI  Jessica [name]  is a 71 y.o. 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 Patient is admitted to the hospital ER yesterday with altered mental status. Uncertain as to whether or not this was contributed to by toe infection.    Review of Systems  : Patient is relatively alert and responsive today. Son is with her. She finds no specific problems at this point. She is mostly numb to the lower extremities. She is nonambulatory.  In addition to the HPI above,  No Fever-chills, No Headache, No changes with Vision or hearing, No problems swallowing food or Liquids, No Chest pain, Cough or Shortness of Breath, No Abdominal pain, No Nausea or Vommitting, Bowel movements are regular, No Blood in stool or Urine, No dysuria, Dermatologic shows drainage and paronychia from the left hallux nail. No new joints pains-aches,  No new weakness, tingling, numbness in  any extremity,: Patient does have significant neuromuscular weakness to the lower extremities bilaterally does not ambulate or walk. She is fairly numb from a neurological standpoint as well. No recent weight gain or loss, No polyuria, polydypsia or polyphagia, No significant Mental Stressors. No longer wearing suffering from any types of hallucinations as she was yesterday with altered mental state.  A full 10 point Review of Systems was done, except as stated above, all other Review of Systems were negative.   Social History Social History  Substance Use Topics  . Smoking status: Never Smoker  . Smokeless tobacco: Current User    Types: Snuff  . Alcohol use No     Family History Family History  Problem Relation Age of Onset  . Heart disease Father   . Deep vein thrombosis Father     Prior to Admission medications   Medication Sig Start Date End Date Taking? Authorizing Provider  amlodipine-atorvastatin (CADUET) 2.5-10 MG tablet Take 1 tablet by mouth daily.   Yes [provider]  aspirin EC 81 MG tablet Take 81 mg by mouth daily.   Yes [provider]  baclofen (LIORESAL) 20 MG tablet Take 5-20 mg by mouth See admin instructions. tk 20mg  qam, 5mg  @ 1400,  and 20mg  qpm   Yes [provider]  docusate sodium (COLACE) 100 MG capsule Take 100 mg by mouth 2 (two) times daily.   Yes [provider]  furosemide (LASIX) 20 MG tablet Take 20 mg by mouth daily.   Yes [provider]  hydrOXYzine (ATARAX/VISTARIL) 25 MG tablet Take 25 mg by mouth 2 (two) times daily as needed for itching.   Yes [provider]  levothyroxine (SYNTHROID, LEVOTHROID) 150 MCG tablet Take 150 mcg by mouth daily before breakfast.   Yes [provider]  metoprolol tartrate (LOPRESSOR) 25 MG tablet Take 1 tablet (25 mg total) by mouth 2 (two) times daily. 07/07/16  Yes Sainani, Rolly Pancake, MD  potassium chloride SA (K-DUR,KLOR-CON) 20 MEQ tablet Take 30 mEq  by mouth daily.    Yes [provider]  pregabalin (LYRICA) 150 MG capsule Take 150 mg by mouth 3 (three) times daily.   Yes [provider]  senna-docusate (SENOKOT-S) 8.6-50 MG tablet Take 2 tablets by mouth daily.   Yes [provider]  Simethicone 180 MG CAPS Take 2 capsules by mouth 3 (three) times daily.   Yes [provider]  traZODone (DESYREL) 100 MG tablet Take 100 mg by mouth at bedtime.   Yes [provider]  Venlafaxine HCl 225 MG TB24 Take 225 mg by mouth daily.   Yes [provider]  vitamin B-12 (CYANOCOBALAMIN) 1000 MCG tablet Take 1,000 mcg by mouth daily.   Yes [provider]  ciprofloxacin (CIPRO) 500 MG tablet Take 1 tablet (500 mg total) by mouth 2 (two) times daily. 08/15/16 08/18/16  Adrian Saran, MD  sulfamethoxazole-trimethoprim (BACTRIM DS,SEPTRA DS) 800-160 MG tablet Take 1 tablet by mouth 2 (two) times daily. 08/15/16   Adrian Saran, MD    Anti-infectives    Start     Dose/Rate Route Frequency Ordered Stop   08/15/16 0500  vancomycin (VANCOCIN) IVPB 1000 mg/200 mL premix     1,000 mg 200 mL/hr over 60 Minutes Intravenous Every 12 hours 08/14/16 2020     08/15/16 0000  sulfamethoxazole-trimethoprim (BACTRIM DS,SEPTRA DS) 800-160 MG tablet     1 tablet Oral 2 times daily 08/15/16 0930     08/15/16 0000  ciprofloxacin (CIPRO) 500 MG tablet     500 mg Oral 2 times daily 08/15/16 0930 08/18/16 2359   08/14/16 2030  aztreonam (AZACTAM) 2 g in dextrose 5 % 50 mL IVPB     2 g 100 mL/hr over 30 Minutes Intravenous Every 12 hours 08/14/16 1930     08/14/16 1930  vancomycin (VANCOCIN) IVPB 1000 mg/200 mL premix     1,000 mg 200 mL/hr over 60 Minutes Intravenous  Once 08/14/16 1930 08/14/16 2255   08/14/16 1915  ciprofloxacin (CIPRO) IVPB 400 mg     400 mg 200 mL/hr over 60 Minutes Intravenous  Once 08/14/16 1908 08/14/16 2126      Scheduled Meds: . amLODipine  10 mg Oral Daily  . aspirin EC  81 mg Oral Daily   . docusate sodium  100 mg Oral BID  . enoxaparin (LOVENOX) injection  40 mg Subcutaneous Q24H  . levothyroxine  150 mcg Oral QAC breakfast  . senna-docusate  2 tablet Oral Daily  . venlafaxine XR  225 mg Oral Daily  . vitamin B-12  1,000 mcg Oral Daily   Continuous Infusions: . aztreonam Stopped (08/15/16 0920)  . vancomycin Stopped (08/15/16 0629)   PRN Meds:.acetaminophen **OR** acetaminophen  Allergies  Allergen Reactions  .  Ambien [Zolpidem Tartrate] Other (See Comments)    Reaction: altered mental status  . Amoxicillin Other (See Comments)    Reaction: blisters in mouth Has patient had a PCN reaction causing immediate rash, facial/tongue/throat swelling, SOB or lightheadedness with hypotension: Yes Has patient had a PCN reaction causing severe rash involving mucus membranes or skin necrosis: No Has patient had a PCN reaction that required hospitalization No Has patient had a PCN reaction occurring within the last 10 years: Yes If all of the above answers are "NO", then may proceed with Cephalosporin use.     Physical Exam  Vitals  Blood pressure (!) 142/62, pulse 68, temperature 97.8 F (36.6 C), temperature source Oral, resp. rate (!) 23, height 5\' 10"  (1.778 m), weight 88.3 kg (194 lb 11.2 oz), SpO2 95 %.  Lower Extremity exam:  Vascular:DP PT pulses are easily palpable at +2 over 4 bilateral  Dermatological: Patient has paronychia and granuloma formation to both borders of left hallux nail drainage from these regions. No real cellulitis or progressive infection is noted.  Neurological: Significant neuromuscular weakness secondary to spinal issues.  Ortho: Patient has flexion contractures to both feet.  Data Review  CBC  Recent Labs Lab 08/14/16 1544 08/15/16 0435  WBC 12.3* 9.8  HGB 15.3 14.4  HCT 45.9 42.4  PLT 294 259  MCV 78.3* 78.2*  MCH 26.2 26.6  MCHC 33.5 34.0  RDW 14.1 14.0  LYMPHSABS 2.2  --   MONOABS 0.9  --   EOSABS 0.2  --    BASOSABS 0.2*  --    ------------------------------------------------------------------------------------------------------------------  Chemistries   Recent Labs Lab 08/14/16 1544 08/15/16 0435  NA 136 137  K 3.9 3.7  CL 99* 103  CO2 28 28  GLUCOSE 116* 99  BUN 12 10  CREATININE 0.59 0.57  CALCIUM 9.5 9.1  AST 32  --   ALT 22  --   ALKPHOS 104  --   BILITOT 0.8  --    ------------------------------------------------------------------------------------------------------------------ estimated creatinine clearance is 80 mL/min (by C-G formula based on SCr of 0.57 mg/dL). ------------------------------------------------------------------------------------------------------------------  Recent Labs  08/15/16 0435  TSH 7.866*     Coagulation profile  ---------------------------------------------------------------------------------------------------------------  Urinalysis    Component Value Date/Time   COLORURINE YELLOW (A) 08/14/2016 1653   APPEARANCEUR HAZY (A) 08/14/2016 1653   LABSPEC 1.015 08/14/2016 1653   PHURINE 5.0 08/14/2016 1653   GLUCOSEU NEGATIVE 08/14/2016 1653   HGBUR NEGATIVE 08/14/2016 1653   BILIRUBINUR NEGATIVE 08/14/2016 1653   KETONESUR NEGATIVE 08/14/2016 1653   PROTEINUR NEGATIVE 08/14/2016 1653   UROBILINOGEN 0.2 11/20/2008 0939   NITRITE NEGATIVE 08/14/2016 1653   LEUKOCYTESUR SMALL (A) 08/14/2016 1653     Imaging results:   Ct Head Wo Contrast  Result Date: 08/14/2016 CLINICAL DATA:  Per EMS pt lives at home and was reported reaching out for things that were not there and stating that her family dog did not belong to her. Confusion. EXAM: CT HEAD WITHOUT CONTRAST TECHNIQUE: Contiguous axial images were obtained from the base of the skull through the vertex without intravenous contrast. COMPARISON:  07/06/2016 FINDINGS: Brain: No evidence of acute infarction, hemorrhage, extra-axial collection, ventriculomegaly, or mass effect.  Generalized cerebral atrophy. Periventricular white matter low attenuation likely secondary to microangiopathy. Vascular: Cerebrovascular atherosclerotic calcifications are noted. Skull: Negative for fracture or focal lesion. Sinuses/Orbits: Visualized portions of the orbits are unremarkable. Visualized portions of the paranasal sinuses and mastoid air cells are unremarkable. Other: None. IMPRESSION: 1. No acute intracranial pathology.  2. Chronic microvascular disease and cerebral atrophy. Electronically Signed   By: Elige Ko   On: 08/14/2016 16:53   Dg Chest Portable 1 View  Result Date: 08/14/2016 CLINICAL DATA:  71 year old presenting with acute confusion. Current history of hypertension. EXAM: PORTABLE CHEST 1 VIEW COMPARISON:  07/06/2016, 01/07/2011 and earlier. FINDINGS: Cardiac silhouette mildly enlarged for AP portable technique, unchanged. Suboptimal inspiration which accounts for mild bibasilar atelectasis. Lungs otherwise clear. Pulmonary vascularity normal. Thoracic dextroscoliosis again noted. IMPRESSION: Suboptimal inspiration accounts for mild bibasilar atelectasis. No acute cardiopulmonary disease otherwise. Stable mild cardiomegaly without pulmonary edema. Electronically Signed   By: Hulan Saas M.D.   On: 08/14/2016 16:08   Dg Toe Great Left  Result Date: 08/15/2016 CLINICAL DATA:  Tenderness and bleeding of left first toe nail. EXAM: LEFT GREAT TOE COMPARISON:  None. FINDINGS: Bones are diffusely osteopenic. No evidence of fracture, dislocation, bony destruction or bony lesions. Soft tissues are unremarkable. No foreign body or soft tissue gas identified. IMPRESSION: Diffuse osteopenia. No evidence of fracture, osteomyelitis or visible soft tissue process. Electronically Signed   By: Irish Lack M.D.   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 Granuloma formation is noted along the regions as well.  Plan: I think  the best thing to do is avulse the nail in its entirety let it drain. Infection under control. This was accomplished as follows:  Following explanation of the consent form and signed consent form by her son the left hallux was anesthetized with 1% lidocaine plain. Toe was then prepped and a tourniquet was placed to base the toe. This point blunt dissection was utilized to a pulse of the nail in its entirety. Granulomatous formations were noted and debrided with tissue nippers. No deep abscess or lacerations to the nail bed were noted. Now. To be clean on the bed area and on the periungual regions. Pharynx sulfate was then used to cauterize the granulomatous areas and a sterile dressing was placed across the area consisting of wet-to-dry saline dressing. Patient tolerated procedure and anesthesia well. Instructions were given to the patient for dressing changes to include wet-to-dry saline dressing daily for 2-4 days and then just Neosporin and Band-Aid as needed for 1 2:30 weeks until the areas dry. They're to call the office if any problems. My office numbers were given to the patient's son.  Active Problems:   Acute encephalopathy  Family Communication: Plan discussed with patient and family  Epimenio Sarin M.D on 08/15/2016 at 10:47 AM  Thank you for the consult, we will follow the patient with you in the Hospital.

## 2016-08-15 NOTE — Care Management Obs Status (Signed)
MEDICARE OBSERVATION STATUS NOTIFICATION   Patient Details  Name: Jessica [name] MRN: [id number] Date of Birth: [birth year]   Medicare Observation Status Notification Given:  No (Discharge order less than 24 hours)    Caren MacadamMichelle Mariesha Venturella, RN 08/15/2016, 1:58 PM

## 2016-08-15 NOTE — Discharge Summary (Signed)
[facility] [place] [facility]   [name]    MR#:  [id number]  [facility]  September 16, 1945  DATE OF ADMISSION:  08/14/2016 ADMITTING PHYSICIAN: Alford Highlandichard Wieting, MD  DATE OF DISCHARGE: 08/15/2016  PRIMARY CARE PHYSICIAN: [facility]    ADMISSION DIAGNOSIS:  Delirium [R41.0] Bacteria in urine [R82.71] Urinary tract infection, acute [N39.0]  DISCHARGE DIAGNOSIS:  Active Problems:   Acute encephalopathy   SECONDARY DIAGNOSIS:   Past Medical History:  Diagnosis Date  . Chronic pain   . Depression   . HLD (hyperlipidemia)   . HTN (hypertension)   . Hypothyroidism   . Morbid obesity (HCC)   . Recurrent UTI   . Spinal stenosis   . Visual hallucinations     HOSPITAL COURSE:   71 year old female with history of spinal stenosis who is wheelchair-bound who is brought in due to altered mental status.  1. Acute metabolic encephalopathy in the setting of UTI. Patient's mental status Has improved with IV antibiotics. Son reports that patient has not taken more medications than prescribed. She has been on her medications for several years and therefore I do not believe that drug overdose caused metabolic encephalopathy.  2. Left big toenail infection/Chronic ingrown toenail with granulation: X-Ray was negative for osteomyelitis She was evaluated by podiatry.. Toenail was avulsed. Recommendations were for wet to dry saline dressings daily for 2-4 days and then just Neosporin and Band-Aid as needed for 1-2 weeks. She is being discharged with Bactrim and ciprofloxacin for toenail infection and UTI.   3. Essential hypertension: Holding beta blocker due to first-degree AV block with some bradycardia  4. Spinal stenosis: Patient may resume Lyrica and baclofen at discharge  5. Hypothyroidism: Continue Synthroid  6. Depression: Patient should continue Effexor  DISCHARGE CONDITIONS AND DIET:   Stable for  discharge on heart healthy diet  CONSULTS OBTAINED:  Treatment Team:  Recardo Evangelistroxler, Matthew, DPM  DRUG ALLERGIES:   Allergies  Allergen Reactions  . Ambien [Zolpidem Tartrate] Other (See Comments)    Reaction: altered mental status  . Amoxicillin Other (See Comments)    Reaction: blisters in mouth Has patient had a PCN reaction causing immediate rash, facial/tongue/throat swelling, SOB or lightheadedness with hypotension: Yes Has patient had a PCN reaction causing severe rash involving mucus membranes or skin necrosis: No Has patient had a PCN reaction that required hospitalization No Has patient had a PCN reaction occurring within the last 10 years: Yes If all of the above answers are "NO", then may proceed with Cephalosporin use.     DISCHARGE MEDICATIONS:   Current Discharge Medication List    START taking these medications   Details  ciprofloxacin (CIPRO) 500 MG tablet Take 1 tablet (500 mg total) by mouth 2 (two) times daily. Qty: 6 tablet, Refills: 0    sulfamethoxazole-trimethoprim (BACTRIM DS,SEPTRA DS) 800-160 MG tablet Take 1 tablet by mouth 2 (two) times daily. Qty: 20 tablet, Refills: 0      CONTINUE these medications which have NOT CHANGED   Details  amlodipine-atorvastatin (CADUET) 2.5-10 MG tablet Take 1 tablet by mouth daily.    aspirin EC 81 MG tablet Take 81 mg by mouth daily.    baclofen (LIORESAL) 20 MG tablet Take 5-20 mg by mouth See admin instructions. tk 20mg  qam, 5mg  @ 1400, and 20mg  qpm    docusate sodium (COLACE) 100 MG capsule Take 100 mg by mouth 2 (two) times daily.    furosemide (LASIX) 20  MG tablet Take 20 mg by mouth daily.    hydrOXYzine (ATARAX/VISTARIL) 25 MG tablet Take 25 mg by mouth 2 (two) times daily as needed for itching.    levothyroxine (SYNTHROID, LEVOTHROID) 150 MCG tablet Take 150 mcg by mouth daily before breakfast.    potassium chloride SA (K-DUR,KLOR-CON) 20 MEQ tablet Take 30 mEq by mouth daily.     pregabalin (LYRICA)  150 MG capsule Take 150 mg by mouth 3 (three) times daily.    senna-docusate (SENOKOT-S) 8.6-50 MG tablet Take 2 tablets by mouth daily.    Simethicone 180 MG CAPS Take 2 capsules by mouth 3 (three) times daily.    traZODone (DESYREL) 100 MG tablet Take 100 mg by mouth at bedtime.    Venlafaxine HCl 225 MG TB24 Take 225 mg by mouth daily.    vitamin B-12 (CYANOCOBALAMIN) 1000 MCG tablet Take 1,000 mcg by mouth daily.      STOP taking these medications     metoprolol tartrate (LOPRESSOR) 25 MG tablet           Today   CHIEF COMPLAINT:  Patient's mental status is back to baseline   VITAL SIGNS:  Blood pressure (!) 142/62, pulse 68, temperature 97.8 F (36.6 C), temperature source Oral, resp. rate (!) 23, height 5\' 10"  (1.778 m), weight 88.3 kg (194 lb 11.2 oz), SpO2 95 %.   REVIEW OF SYSTEMS:  Review of Systems  Constitutional: Negative.  Negative for chills, fever and malaise/fatigue.  HENT: Negative.  Negative for ear discharge, ear pain, hearing loss, nosebleeds and sore throat.   Eyes: Negative.  Negative for blurred vision and pain.  Respiratory: Negative.  Negative for cough, hemoptysis, shortness of breath and wheezing.   Cardiovascular: Negative.  Negative for chest pain, palpitations and leg swelling.  Gastrointestinal: Negative.  Negative for abdominal pain, blood in stool, diarrhea, nausea and vomiting.  Genitourinary: Negative.  Negative for dysuria.  Musculoskeletal: Negative for back pain.       Wheel chair bound  Skin: Negative.        Big toe infection  Neurological: Negative for dizziness, tremors, speech change, focal weakness, seizures and headaches.  Endo/Heme/Allergies: Negative.  Does not bruise/bleed easily.  Psychiatric/Behavioral: Negative.  Negative for depression, hallucinations and suicidal ideas.     PHYSICAL EXAMINATION:  GENERAL:  71 y.o.-year-old patient lying in the bed with no acute distress. obese NECK:  Supple, no jugular venous  distention. No thyroid enlargement, no tenderness.  LUNGS: Normal breath sounds bilaterally, no wheezing, rales,rhonchi  No use of accessory muscles of respiration.  CARDIOVASCULAR: S1, S2 normal. No murmurs, rubs, or gallops.  ABDOMEN: Soft, non-tender, non-distended. Bowel sounds present. No organomegaly or mass.  EXTREMITIES: No pedal edema, cyanosis, or clubbing.  PSYCHIATRIC: The patient is alert and oriented x 3.  SKIN: left big toe with discolarion.   DATA REVIEW:   CBC  Recent Labs Lab 08/15/16 0435  WBC 9.8  HGB 14.4  HCT 42.4  PLT 259    Chemistries   Recent Labs Lab 08/14/16 1544 08/15/16 0435  NA 136 137  K 3.9 3.7  CL 99* 103  CO2 28 28  GLUCOSE 116* 99  BUN 12 10  CREATININE 0.59 0.57  CALCIUM 9.5 9.1  AST 32  --   ALT 22  --   ALKPHOS 104  --   BILITOT 0.8  --     Cardiac Enzymes  Recent Labs Lab 08/14/16 1544  TROPONINI <0.03    Microbiology Results  @  [id number]@  RADIOLOGY:  Ct Head Wo Contrast  Result Date: 08/14/2016 CLINICAL DATA:  Per EMS pt lives at home and was reported reaching out for things that were not there and stating that her family dog did not belong to her. Confusion. EXAM: CT HEAD WITHOUT CONTRAST TECHNIQUE: Contiguous axial images were obtained from the base of the skull through the vertex without intravenous contrast. COMPARISON:  07/06/2016 FINDINGS: Brain: No evidence of acute infarction, hemorrhage, extra-axial collection, ventriculomegaly, or mass effect. Generalized cerebral atrophy. Periventricular white matter low attenuation likely secondary to microangiopathy. Vascular: Cerebrovascular atherosclerotic calcifications are noted. Skull: Negative for fracture or focal lesion. Sinuses/Orbits: Visualized portions of the orbits are unremarkable. Visualized portions of the paranasal sinuses and mastoid air cells are unremarkable. Other: None. IMPRESSION: 1. No acute intracranial pathology. 2. Chronic microvascular disease and  cerebral atrophy. Electronically Signed   By: Elige Ko   On: 08/14/2016 16:53   Dg Chest Portable 1 View  Result Date: 08/14/2016 CLINICAL DATA:  71 year old presenting with acute confusion. Current history of hypertension. EXAM: PORTABLE CHEST 1 VIEW COMPARISON:  07/06/2016, 01/07/2011 and earlier. FINDINGS: Cardiac silhouette mildly enlarged for AP portable technique, unchanged. Suboptimal inspiration which accounts for mild bibasilar atelectasis. Lungs otherwise clear. Pulmonary vascularity normal. Thoracic dextroscoliosis again noted. IMPRESSION: Suboptimal inspiration accounts for mild bibasilar atelectasis. No acute cardiopulmonary disease otherwise. Stable mild cardiomegaly without pulmonary edema. Electronically Signed   By: Hulan Saas M.D.   On: 08/14/2016 16:08   Dg Toe Great Left  Result Date: 08/15/2016 CLINICAL DATA:  Tenderness and bleeding of left first toe nail. EXAM: LEFT GREAT TOE COMPARISON:  None. FINDINGS: Bones are diffusely osteopenic. No evidence of fracture, dislocation, bony destruction or bony lesions. Soft tissues are unremarkable. No foreign body or soft tissue gas identified. IMPRESSION: Diffuse osteopenia. No evidence of fracture, osteomyelitis or visible soft tissue process. Electronically Signed   By: Irish Lack M.D.   On: 08/15/2016 09:05      Current Discharge Medication List    START taking these medications   Details  ciprofloxacin (CIPRO) 500 MG tablet Take 1 tablet (500 mg total) by mouth 2 (two) times daily. Qty: 6 tablet, Refills: 0    sulfamethoxazole-trimethoprim (BACTRIM DS,SEPTRA DS) 800-160 MG tablet Take 1 tablet by mouth 2 (two) times daily. Qty: 20 tablet, Refills: 0      CONTINUE these medications which have NOT CHANGED   Details  amlodipine-atorvastatin (CADUET) 2.5-10 MG tablet Take 1 tablet by mouth daily.    aspirin EC 81 MG tablet Take 81 mg by mouth daily.    baclofen (LIORESAL) 20 MG tablet Take 5-20 mg by mouth See  admin instructions. tk 20mg  qam, 5mg  @ 1400, and 20mg  qpm    docusate sodium (COLACE) 100 MG capsule Take 100 mg by mouth 2 (two) times daily.    furosemide (LASIX) 20 MG tablet Take 20 mg by mouth daily.    hydrOXYzine (ATARAX/VISTARIL) 25 MG tablet Take 25 mg by mouth 2 (two) times daily as needed for itching.    levothyroxine (SYNTHROID, LEVOTHROID) 150 MCG tablet Take 150 mcg by mouth daily before breakfast.    potassium chloride SA (K-DUR,KLOR-CON) 20 MEQ tablet Take 30 mEq by mouth daily.     pregabalin (LYRICA) 150 MG capsule Take 150 mg by mouth 3 (three) times daily.    senna-docusate (SENOKOT-S) 8.6-50 MG tablet Take 2 tablets by mouth daily.    Simethicone 180 MG CAPS Take 2 capsules by mouth 3 (three)  times daily.    traZODone (DESYREL) 100 MG tablet Take 100 mg by mouth at bedtime.    Venlafaxine HCl 225 MG TB24 Take 225 mg by mouth daily.    vitamin B-12 (CYANOCOBALAMIN) 1000 MCG tablet Take 1,000 mcg by mouth daily.      STOP taking these medications     metoprolol tartrate (LOPRESSOR) 25 MG tablet            Management plans discussed with the patient and family and they are in agreement. Stable for discharge home  Patient should follow up with pcp  CODE STATUS:     Code Status Orders        Start     Ordered   08/14/16 1930  Full code  Continuous     08/14/16 1930    Code Status History    Date Active Date Inactive Code Status Order ID Comments User Context   07/06/2016  7:49 AM 07/07/2016  9:05 PM Full Code [id number]  Arnaldo Natal, MD Inpatient    Advance Directive Documentation     Most Recent Value  Type of Advance Directive  Healthcare Power of Attorney  Pre-existing out of facility DNR order (yellow form or pink MOST form)  -  "MOST" Form in Place?  -      TOTAL TIME TAKING CARE OF THIS PATIENT: 37 minutes.    Note: This dictation was prepared with Dragon dictation along with smaller phrase technology. Any transcriptional  errors that result from this process are unintentional.  Crysta Gulick M.D on 08/15/2016 at 9:33 AM  Between 7am to 6pm - Pager - [phone] After 6pm go to [email or web address] - Social research officer, government  [facility] [place] [facility]  Office  [phone]  CC: Primary care physician; [facility]

## 2016-08-15 NOTE — Progress Notes (Signed)
EMS present for pt discharge; pt discharged via stretcher to her home

## 2016-08-15 NOTE — Progress Notes (Signed)
MD order received to discharge pt home today; verbally reviewed AVS with pt, pt's spouse and pt's son including wound care instructions from Dr Orland Jarredroxler, no questions voiced at this time; EMS contacted via telephone call for nonemergency transport from Room 110 to [address] [place], [place] [address]; pt's discharge pending arrival of EMS

## 2016-08-15 NOTE — Progress Notes (Addendum)
[facility] [place] [facility]   [name]    MR#:  [id number]  DATE OF BIRTH:  May 07, 1945  SUBJECTIVE:   Mental status back to baseline  Plan at bedside REVIEW OF SYSTEMS:    Review of Systems  Constitutional: Negative for fever, chills weight loss HENT: Negative for ear pain, nosebleeds, congestion, facial swelling, rhinorrhea, neck pain, neck stiffness and ear discharge.   Respiratory: Negative for cough, shortness of breath, wheezing  Cardiovascular: Negative for chest pain, palpitations and leg swelling.  Gastrointestinal: Negative for heartburn, abdominal pain, vomiting, diarrhea or consitpation Genitourinary: Negative for dysuria, urgency, frequency, hematuria Musculoskeletal: Negative for back pain or joint pain He has spinal stenosis and is wheelchair-bound at baseline Neurological: Negative for dizziness, seizures, syncope, focal weakness,  numbness and headaches.  Hematological: Does not bruise/bleed easily.  Psychiatric/Behavioral: Negative for hallucinations, confusion, dysphoric mood  Left big toe with area of plaque around toenail bed  Tolerating Diet: yes      DRUG ALLERGIES:   Allergies  Allergen Reactions  . Ambien [Zolpidem Tartrate] Other (See Comments)    Reaction: altered mental status  . Amoxicillin Other (See Comments)    Reaction: blisters in mouth Has patient had a PCN reaction causing immediate rash, facial/tongue/throat swelling, SOB or lightheadedness with hypotension: Yes Has patient had a PCN reaction causing severe rash involving mucus membranes or skin necrosis: No Has patient had a PCN reaction that required hospitalization No Has patient had a PCN reaction occurring within the last 10 years: Yes If all of the above answers are "NO", then may proceed with Cephalosporin use.     VITALS:  Blood pressure (!) 142/62, pulse 68, temperature 97.8 F (36.6 C), temperature source Oral, resp. rate (!)  23, height 5\' 10"  (1.778 m), weight 88.3 kg (194 lb 11.2 oz), SpO2 95 %.  PHYSICAL EXAMINATION:  Constitutional: Appears well-developed. No distress.Obese HENT: Normocephalic. Marland Kitchen. Oropharynx is clear and moist.  Eyes: Conjunctivae and EOM are normal. PERRLA, no scleral icterus.  Neck: Normal ROM. Neck supple. No JVD. No tracheal deviation. CVS: RRR, S1/S2 +, no murmurs, no gallops, no carotid bruit.  Pulmonary: Effort and breath sounds normal, no stridor, rhonchi, wheezes, rales.  Abdominal: Soft. BS +,  no distension, tenderness, rebound or guarding.  Musculoskeletal: Normal range of motion. No edema and no tenderness.  Neuro: Alert. CN 2-12 grossly intact. No focal deficits. Skin: Skin is warm and dry. Left big toe with some discoloration around toe nailbed  Psychiatric: Normal mood and affect.      LABORATORY PANEL:   CBC  Recent Labs Lab 08/15/16 0435  WBC 9.8  HGB 14.4  HCT 42.4  PLT 259   ------------------------------------------------------------------------------------------------------------------  Chemistries   Recent Labs Lab 08/14/16 1544 08/15/16 0435  NA 136 137  K 3.9 3.7  CL 99* 103  CO2 28 28  GLUCOSE 116* 99  BUN 12 10  CREATININE 0.59 0.57  CALCIUM 9.5 9.1  AST 32  --   ALT 22  --   ALKPHOS 104  --   BILITOT 0.8  --    ------------------------------------------------------------------------------------------------------------------  Cardiac Enzymes  Recent Labs Lab 08/14/16 1544  TROPONINI <0.03   ------------------------------------------------------------------------------------------------------------------  RADIOLOGY:  Ct Head Wo Contrast  Result Date: 08/14/2016 CLINICAL DATA:  Per EMS pt lives at home and was reported reaching out for things that were not there and stating that her family dog did not belong to her. Confusion. EXAM: CT HEAD WITHOUT CONTRAST  TECHNIQUE: Contiguous axial images were obtained from the base of the  skull through the vertex without intravenous contrast. COMPARISON:  07/06/2016 FINDINGS: Brain: No evidence of acute infarction, hemorrhage, extra-axial collection, ventriculomegaly, or mass effect. Generalized cerebral atrophy. Periventricular white matter low attenuation likely secondary to microangiopathy. Vascular: Cerebrovascular atherosclerotic calcifications are noted. Skull: Negative for fracture or focal lesion. Sinuses/Orbits: Visualized portions of the orbits are unremarkable. Visualized portions of the paranasal sinuses and mastoid air cells are unremarkable. Other: None. IMPRESSION: 1. No acute intracranial pathology. 2. Chronic microvascular disease and cerebral atrophy. Electronically Signed   By: Elige Ko   On: 08/14/2016 16:53   Dg Chest Portable 1 View  Result Date: 08/14/2016 CLINICAL DATA:  71 year old presenting with acute confusion. Current history of hypertension. EXAM: PORTABLE CHEST 1 VIEW COMPARISON:  07/06/2016, 01/07/2011 and earlier. FINDINGS: Cardiac silhouette mildly enlarged for AP portable technique, unchanged. Suboptimal inspiration which accounts for mild bibasilar atelectasis. Lungs otherwise clear. Pulmonary vascularity normal. Thoracic dextroscoliosis again noted. IMPRESSION: Suboptimal inspiration accounts for mild bibasilar atelectasis. No acute cardiopulmonary disease otherwise. Stable mild cardiomegaly without pulmonary edema. Electronically Signed   By: Hulan Saas M.D.   On: 08/14/2016 16:08     ASSESSMENT AND PLAN:   71 year old female with history of spinal stenosis who is wheelchair-bound who is brought in due to altered mental status.  1. Acute metabolic encephalopathy in the setting of UTI. Patient's mental status is improved with IV antibiotics. Son reports that patient has not taken more medications than prescribed. She has been on her medications for several years and therefore I do not believe that drug overdose caused metabolic  encephalopathy.  2. Left big toenail infection: Order x-ray to value for osteomyelitis.  Podiatry consult Continue broad-spectrum    3. Essential hypertension: Holding beta blocker due to first-degree AV block with some bradycardia C 4. Spinal stenosis: Patient may resume Lyrica and baclofen at discharge  5. Hepatitis and: Continue Synthroid  6. Depression: Patient should continue Effexor   Management plans discussed with the patient and family and they are in agreement.  CODE STATUS: full  TOTAL TIME TAKING CARE OF THIS PATIENT: 30 minutes.     POSSIBLE D/C today, DEPENDING ON CLINICAL CONDITION.   Jontavia Leatherbury M.D on 08/15/2016 at 7:52 AM  Between 7am to 6pm - Pager - [phone] After 6pm go to [email or web address] - Social research officer, government  [facility] [place] [facility]  Office  [phone]  CC: Primary care physician; [facility]  Note: This dictation was prepared with Nurse, children's dictation along with smaller phrase technology. Any transcriptional errors that result from this process are unintentional.

## 2016-08-15 NOTE — Discharge Instructions (Signed)
Wound Care Instructions for left great toe per Dr Orland Jarredroxler - for the first 4 days (apply gauze and wet with saline);  After 4 days apply Neosporin and Bandaid as needed for 2 weeks.

## 2016-08-15 NOTE — Progress Notes (Signed)
PT Cancellation Note  Patient Details Name: Jessica [name] MRN: [id number] DOB: Jun 18, 1945   Cancelled Treatment:    Reason Eval/Treat Not Completed: Medical issues which prohibited therapy;Patient at procedure or test/unavailable.  Try later as time and pt allow.   Ivar DrapeRuth E Bryonna Sundby 08/15/2016, 11:29 AM   Samul Dadauth Amira Podolak, PT MS Acute Rehab Dept. Number: [facility] [phone] and [facility] [phone]

## 2016-08-16 LAB — URINE CULTURE: Special Requests: NORMAL

## 2016-08-24 ENCOUNTER — Emergency Department: Payer: Medicare Other

## 2016-08-24 ENCOUNTER — Emergency Department
Admission: EM | Admit: 2016-08-24 | Discharge: 2016-08-24 | Disposition: A | Discharge status: Home / Self Care | Payer: Medicare Other | Attending: Emergency Medicine | Admitting: Emergency Medicine

## 2016-08-24 DIAGNOSIS — E039 Hypothyroidism, unspecified: Secondary | ICD-10-CM | POA: Diagnosis not present

## 2016-08-24 DIAGNOSIS — Z79899 Other long term (current) drug therapy: Secondary | ICD-10-CM | POA: Insufficient documentation

## 2016-08-24 DIAGNOSIS — R4182 Altered mental status, unspecified: Secondary | ICD-10-CM | POA: Diagnosis present

## 2016-08-24 DIAGNOSIS — Z7982 Long term (current) use of aspirin: Secondary | ICD-10-CM | POA: Diagnosis not present

## 2016-08-24 DIAGNOSIS — I10 Essential (primary) hypertension: Secondary | ICD-10-CM | POA: Diagnosis not present

## 2016-08-24 LAB — URINALYSIS, COMPLETE (UACMP) WITH MICROSCOPIC
Bacteria, UA: NONE SEEN
Bilirubin Urine: NEGATIVE
Glucose, UA: NEGATIVE mg/dL
Hgb urine dipstick: NEGATIVE
Ketones, ur: NEGATIVE mg/dL
Leukocytes, UA: NEGATIVE
Nitrite: NEGATIVE
Protein, ur: NEGATIVE mg/dL
Specific Gravity, Urine: 1.008 (ref 1.005–1.030)
pH: 5 (ref 5.0–8.0)

## 2016-08-24 LAB — COMPREHENSIVE METABOLIC PANEL
ALT: 24 U/L (ref 14–54)
AST: 36 U/L (ref 15–41)
Albumin: 3.7 g/dL (ref 3.5–5.0)
Alkaline Phosphatase: 91 U/L (ref 38–126)
Anion gap: 10 (ref 5–15)
BUN: 9 mg/dL (ref 6–20)
CO2: 29 mmol/L (ref 22–32)
Calcium: 9.6 mg/dL (ref 8.9–10.3)
Chloride: 95 mmol/L — ABNORMAL LOW (ref 101–111)
Creatinine, Ser: 0.81 mg/dL (ref 0.44–1.00)
GFR calc Af Amer: >60 mL/min (ref >60)
GFR calc non Af Amer: >60 mL/min (ref >60)
Glucose, Bld: 97 mg/dL (ref 65–99)
Potassium: 4.5 mmol/L (ref 3.5–5.1)
Sodium: 134 mmol/L — ABNORMAL LOW (ref 135–145)
Total Bilirubin: 0.6 mg/dL (ref 0.3–1.2)
Total Protein: 7.5 g/dL (ref 6.5–8.1)

## 2016-08-24 LAB — CBC
HCT: 44.4 % (ref 35.0–47.0)
Hemoglobin: 14.9 g/dL (ref 12.0–16.0)
MCH: 26.2 pg (ref 26.0–34.0)
MCHC: 33.5 g/dL (ref 32.0–36.0)
MCV: 78.2 fL — ABNORMAL LOW (ref 80.0–100.0)
Platelets: 240 10*3/uL (ref 150–440)
RBC: 5.67 MIL/uL — ABNORMAL HIGH (ref 3.80–5.20)
RDW: 13.8 % (ref 11.5–14.5)
WBC: 8.6 10*3/uL (ref 3.6–11.0)

## 2016-08-24 LAB — TROPONIN I: Troponin I: <0.03 ng/mL (ref <0.03)

## 2016-08-24 NOTE — ED Provider Notes (Addendum)
[facility] Emergency Department Provider Note   ____________________________________________    I have reviewed the triage vital signs and the nursing notes.   HISTORY  Chief Complaint Altered Mental Status  Unable to obtain history of present illness as the patient is essentially unresponsive   HPI Jessica [name] is a 71 y.o. female who presents with altered mental status. Reportedly at her baseline the patient is able to talk and can indicate her needs easily however today she essentially unresponsive and will answer questions. This apparently started today as yesterday she was more normal. It has been constant.No fevers are reported. Review of medical records demonstrates that the patient has been admitted for metabolic encephalopathy in the past with a similar presentation.   Past Medical History:  Diagnosis Date  . Chronic pain   . Depression   . HLD (hyperlipidemia)   . HTN (hypertension)   . Hypothyroidism   . Morbid obesity (HCC)   . Recurrent UTI   . Spinal stenosis   . Visual hallucinations     Patient Active Problem List   Diagnosis Date Noted  . Acute encephalopathy 08/14/2016  . Hallucinations 07/06/2016  . Hypokalemia 07/06/2016  . Sinus tachycardia 07/06/2016  . RECTAL PAIN 09/04/2009  . CONSTIPATION 07/08/2009  . DEEP VENOUS THROMBOPHLEBITIS, BILATERAL 01/07/2009  . PALLOR 01/07/2009  . [facility]  . ABDOMINAL OR PELVIC SWELLING MASS OR LUMP LLQ 10/01/2008  . 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 HYPERLIPIDEMIA 12/10/2006  . ANXIETY 12/10/2006  . DEPRESSION 12/10/2006  . HYPERTENSION 12/10/2006  . OSTEOARTHRITIS 12/10/2006  . HYPOTHYROIDISM 09/22/2006  . HYPERCHOLESTEROLEMIA 09/22/2006  . ECZEMA 09/22/2006    Past Surgical History:  Procedure Laterality Date  . ABDOMINAL HYSTERECTOMY    . BACK SURGERY    . HEMORROIDECTOMY      Prior to Admission medications   Medication Sig Start Date  End Date Taking? Authorizing Provider  amlodipine-atorvastatin (CADUET) 2.5-10 MG tablet Take 1 tablet by mouth daily.   Yes [provider]  aspirin EC 81 MG tablet Take 81 mg by mouth daily.   Yes [provider]  baclofen (LIORESAL) 20 MG tablet Take 5-20 mg by mouth See admin instructions. tk 20mg  qam, 5mg  @ 1400, and 20mg  qpm   Yes [provider]  ciprofloxacin (CIPRO) 500 MG tablet Take 500 mg by mouth 2 (two) times daily. 08/19/16 08/25/16 Yes [provider]  docusate sodium (COLACE) 100 MG capsule Take 100 mg by mouth 2 (two) times daily.   Yes [provider]  furosemide (LASIX) 20 MG tablet Take 20 mg by mouth daily.   Yes [provider]  hydrOXYzine (ATARAX/VISTARIL) 25 MG tablet Take 25 mg by mouth 2 (two) times daily as needed for itching.   Yes [provider]  levothyroxine (SYNTHROID, LEVOTHROID) 150 MCG tablet Take 150 mcg by mouth daily before breakfast.   Yes [provider]  potassium chloride SA (K-DUR,KLOR-CON) 20 MEQ tablet Take 30 mEq by mouth daily.    Yes [provider]  pregabalin (LYRICA) 150 MG capsule Take 150 mg by mouth 3 (three) times daily.   Yes [provider]  senna-docusate (SENOKOT-S) 8.6-50 MG tablet Take 2 tablets by mouth daily.   Yes [provider]  Simethicone 180 MG CAPS Take 2 capsules by mouth 3 (three) times daily.   Yes [provider]  traZODone (DESYREL) 100 MG tablet Take 100 mg by mouth at bedtime.   Yes  [provider]  Venlafaxine HCl 225 MG TB24 Take 225 mg by mouth daily.   Yes [provider]  vitamin B-12 (CYANOCOBALAMIN) 1000 MCG tablet Take 1,000 mcg by mouth daily.   Yes [provider]  sulfamethoxazole-trimethoprim (BACTRIM DS,SEPTRA DS) 800-160 MG tablet Take 1 tablet by mouth 2 (two) times daily. Patient not taking: Reported on 08/24/2016 08/15/16   Adrian Saran, MD     Allergies Ambien [zolpidem  tartrate] and Amoxicillin  Family History  Problem Relation Age of Onset  . Heart disease Father   . Deep vein thrombosis Father     Social History Social History  Substance Use Topics  . Smoking status: Never Smoker  . Smokeless tobacco: Current User    Types: Snuff  . Alcohol use No    Level V caveat: Unable to obtain Review of Systems due to altered mental status     ____________________________________________   PHYSICAL EXAM:  VITAL SIGNS: ED Triage Vitals  Enc Vitals Group     BP 08/24/16 1312 (!) 152/96     Pulse Rate 08/24/16 1312 84     Resp 08/24/16 1312 10     Temp 08/24/16 1312 98.2 F (36.8 C)     Temp Source 08/24/16 1312 Axillary     SpO2 08/24/16 1312 95 %     Weight --      Height --      Head Circumference --      Peak Flow --      Pain Score 08/24/16 1311 0     Pain Loc --      Pain Edu? --      Excl. in GC? --     Constitutional: opens eyes when I call her name Eyes: Conjunctivae are normal.  Head: Atraumatic. Nose: No congestion/rhinnorhea. Mouth/Throat: Mucous membranes are dry Neck:  No vertebral tenderness to palpation Cardiovascular: Normal rate, regular rhythm. Grossly normal heart sounds.  Good peripheral circulation. Respiratory: Normal respiratory effort.  No retractions. Lungs CTAB. Gastrointestinal: Soft and nontender. No distention.  No CVA tenderness. Genitourinary: no rash or erythema Musculoskeletal:  Warm and well perfused. Left great toe nail has been removed, some erythema surrounding the nail bed, small area of black distal great toe, appears to be well perfused Neurologic:  Patient appears to move all extremities Skin:  Skin is warm, dry and intact. No rash noted. Psychiatric: unable to examine  ____________________________________________   LABS (all labs ordered are listed, but only abnormal results are displayed)  Labs Reviewed  TROPONIN I  URINALYSIS, COMPLETE (UACMP) WITH MICROSCOPIC  COMPREHENSIVE  METABOLIC PANEL  CBC  CBG MONITORING, ED   ____________________________________________  EKG  None ____________________________________________  RADIOLOGY  CT head unremarkable ____________________________________________   PROCEDURES  Procedure(s) performed: No    Critical Care performed: No ____________________________________________   INITIAL IMPRESSION / ASSESSMENT AND PLAN / ED COURSE  Pertinent labs & imaging results that were available during my care of the patient were reviewed by me and considered in my medical decision making (see chart for details).  Patient presents with altered mental status. Given history of similar presentations I suspect this is related to metabolic encephalopathy secondary to possibly a urinary tract infection. We will send labs, urine, chest x-ray, CT head and anticipate admission    ____________________________________________   FINAL CLINICAL IMPRESSION(S) / ED DIAGNOSES  Final diagnoses:  Altered mental status, unspecified altered mental status type      NEW MEDICATIONS STARTED DURING THIS VISIT:  New  Prescriptions   No medications on file     Note:  This document was prepared using Dragon voice recognition software and may include unintentional dictation errors.    Jene Every, MD 08/24/16 1554    Jene Every, MD 08/24/16 [phone]

## 2016-08-24 NOTE — ED Notes (Signed)
Changed brief

## 2016-08-24 NOTE — ED Triage Notes (Signed)
Pt arrives [facility] from home for continuation of UTI. Pt was dx last week with UTI and is taking cipro. Last dose is tomorrow. EMS was called for "talking out of her head." Per EMS pt is usually very talkative and today she is not talking much and is saying mean things which is unusual for her. Pt will answer some questions, all correctly. Pt is alert, but keeps saying "why are you asking me that?" when asked name. EMS states pt will say things about her husband being sick but EMS states husband looked fine. Pt is bed bound d/t spinal stenosis.   Pt also had L big toe nail removed last week and has white pus there now. Around toenail is red and necrosis noted to end of toe.

## 2016-08-24 NOTE — ED Notes (Addendum)
Family informed that blood work is still not back and awaiting a few more tests. Family unhappy about wait. Pt sleeping.

## 2016-08-24 NOTE — Discharge Instructions (Signed)
Please seek medical attention for any high fevers, chest pain, shortness of breath, change in behavior, persistent vomiting, bloody stool or any other new or concerning symptoms.  

## 2016-08-24 NOTE — ED Provider Notes (Signed)
-----------------------------------------   7:43 PM on 08/24/2016 -----------------------------------------  Patient's workup here in the emergency department without obvious cause of altered mental status. Urine without any white blood cells. I did discussion with the patient's son and husband. The patient has improved since earlier in the day. At this point I told them that there is no clear etiology of this patient's symptoms. Husband states that her symptoms come and go. The husband in fact pulled me aside outside the room so the son cannot hear and stated that he thinks that this could be related to dementia. Apparently this is a tough diagnosis for this and considered. At this point I offered both and that if the patient wanted to go home and think this is reasonably safe. The patient did want to go home. Again the patient was improving since her arrival to the emergency department there is no obvious etiology. Do think dementia as possible. They will follow up with the patient's primary care physician.   Phineas SemenGoodman, Demetrion Wesby, MD 08/24/16 2104

## 2016-08-24 NOTE — ED Notes (Signed)
Dr Kinner at bedside. 

## 2016-08-24 NOTE — ED Notes (Signed)
Attempted IV insertion. Pt screams and moves. Veins blew each time. Will have someone else take a look.

## 2016-08-24 NOTE — ED Notes (Signed)
Pt taken to scans via stretcher.  

## 2016-08-29 ENCOUNTER — Emergency Department: Payer: Medicare Other

## 2016-08-29 ENCOUNTER — Emergency Department
Admission: EM | Admit: 2016-08-29 | Discharge: 2016-08-30 | Disposition: A | Discharge status: Home / Self Care | Payer: Medicare Other | Attending: Student in an Organized Health Care Education/Training Program | Admitting: Student in an Organized Health Care Education/Training Program

## 2016-08-29 DIAGNOSIS — F329 Major depressive disorder, single episode, unspecified: Secondary | ICD-10-CM | POA: Diagnosis not present

## 2016-08-29 DIAGNOSIS — I10 Essential (primary) hypertension: Secondary | ICD-10-CM | POA: Diagnosis not present

## 2016-08-29 DIAGNOSIS — E039 Hypothyroidism, unspecified: Secondary | ICD-10-CM | POA: Diagnosis not present

## 2016-08-29 DIAGNOSIS — L03032 Cellulitis of left toe: Secondary | ICD-10-CM | POA: Insufficient documentation

## 2016-08-29 DIAGNOSIS — R4182 Altered mental status, unspecified: Secondary | ICD-10-CM | POA: Diagnosis present

## 2016-08-29 DIAGNOSIS — F22 Delusional disorders: Secondary | ICD-10-CM | POA: Diagnosis not present

## 2016-08-29 DIAGNOSIS — B373 Candidiasis of vulva and vagina: Secondary | ICD-10-CM | POA: Insufficient documentation

## 2016-08-29 DIAGNOSIS — F32A Depression, unspecified: Secondary | ICD-10-CM

## 2016-08-29 DIAGNOSIS — Z79899 Other long term (current) drug therapy: Secondary | ICD-10-CM | POA: Diagnosis not present

## 2016-08-29 DIAGNOSIS — B3731 Acute candidiasis of vulva and vagina: Secondary | ICD-10-CM

## 2016-08-29 DIAGNOSIS — R441 Visual hallucinations: Secondary | ICD-10-CM

## 2016-08-29 LAB — COMPREHENSIVE METABOLIC PANEL
ALT: 19 U/L (ref 14–54)
AST: 27 U/L (ref 15–41)
Albumin: 3.6 g/dL (ref 3.5–5.0)
Alkaline Phosphatase: 106 U/L (ref 38–126)
Anion gap: 9 (ref 5–15)
BUN: 9 mg/dL (ref 6–20)
CO2: 29 mmol/L (ref 22–32)
Calcium: 9.4 mg/dL (ref 8.9–10.3)
Chloride: 95 mmol/L — ABNORMAL LOW (ref 101–111)
Creatinine, Ser: 0.54 mg/dL (ref 0.44–1.00)
GFR calc Af Amer: >60 mL/min (ref >60)
GFR calc non Af Amer: >60 mL/min (ref >60)
Glucose, Bld: 96 mg/dL (ref 65–99)
Potassium: 3.7 mmol/L (ref 3.5–5.1)
Sodium: 133 mmol/L — ABNORMAL LOW (ref 135–145)
Total Bilirubin: 0.8 mg/dL (ref 0.3–1.2)
Total Protein: 7.6 g/dL (ref 6.5–8.1)

## 2016-08-29 LAB — CBC WITH DIFFERENTIAL/PLATELET
Basophils Absolute: 0.1 10*3/uL (ref 0–0.1)
Basophils Relative: 1 %
Eosinophils Absolute: 0.2 10*3/uL (ref 0–0.7)
Eosinophils Relative: 2 %
HCT: 44.4 % (ref 35.0–47.0)
Hemoglobin: 15 g/dL (ref 12.0–16.0)
Lymphocytes Relative: 21 %
Lymphs Abs: 1.7 10*3/uL (ref 1.0–3.6)
MCH: 26.4 pg (ref 26.0–34.0)
MCHC: 33.9 g/dL (ref 32.0–36.0)
MCV: 77.9 fL — ABNORMAL LOW (ref 80.0–100.0)
Monocytes Absolute: 0.8 10*3/uL (ref 0.2–0.9)
Monocytes Relative: 10 %
Neutro Abs: 5.4 10*3/uL (ref 1.4–6.5)
Neutrophils Relative %: 66 %
Platelets: 220 10*3/uL (ref 150–440)
RBC: 5.7 MIL/uL — ABNORMAL HIGH (ref 3.80–5.20)
RDW: 14.2 % (ref 11.5–14.5)
WBC: 8.1 10*3/uL (ref 3.6–11.0)

## 2016-08-29 LAB — URINALYSIS, COMPLETE (UACMP) WITH MICROSCOPIC
Bacteria, UA: NONE SEEN
Bilirubin Urine: NEGATIVE
Glucose, UA: NEGATIVE mg/dL
Hgb urine dipstick: NEGATIVE
Ketones, ur: NEGATIVE mg/dL
Leukocytes, UA: NEGATIVE
Nitrite: NEGATIVE
Protein, ur: NEGATIVE mg/dL
Specific Gravity, Urine: 1.01 (ref 1.005–1.030)
pH: 5 (ref 5.0–8.0)

## 2016-08-29 LAB — TSH: TSH: 1.615 u[IU]/mL (ref 0.350–4.500)

## 2016-08-29 LAB — URINE DRUG SCREEN, QUALITATIVE (ARMC ONLY)
Amphetamines, Ur Screen: NOT DETECTED
Barbiturates, Ur Screen: NOT DETECTED
Benzodiazepine, Ur Scrn: NOT DETECTED
Cannabinoid 50 Ng, Ur ~~LOC~~: NOT DETECTED
Cocaine Metabolite,Ur ~~LOC~~: NOT DETECTED
MDMA (Ecstasy)Ur Screen: NOT DETECTED
Methadone Scn, Ur: NOT DETECTED
Opiate, Ur Screen: NOT DETECTED
Phencyclidine (PCP) Ur S: NOT DETECTED
Tricyclic, Ur Screen: NOT DETECTED

## 2016-08-29 LAB — T4, FREE: Free T4: 1.58 ng/dL — ABNORMAL HIGH (ref 0.61–1.12)

## 2016-08-29 LAB — ETHANOL: Alcohol, Ethyl (B): <5 mg/dL (ref <5)

## 2016-08-29 MED ORDER — NYSTATIN 100000 UNIT/GM EX CREA
TOPICAL_CREAM | Freq: Two times a day (BID) | CUTANEOUS | Status: DC
  Administered 2016-08-29: 1 via TOPICAL
  Filled 2016-08-29: qty 15

## 2016-08-29 MED ORDER — DOXYCYCLINE HYCLATE 100 MG PO TABS
100.0000 mg | ORAL_TABLET | Freq: Once | ORAL | Status: AC
  Administered 2016-08-29: 100 mg via ORAL
  Filled 2016-08-29: qty 1

## 2016-08-29 NOTE — ED Notes (Signed)
Pt. Was given an in and out, while performing procedure this nurse noticed redness on inner thighs and vaginal area is red with large amounts yeast in labia folds.  Pt. Cleaned, brief kept off patient at this time.

## 2016-08-29 NOTE — ED Notes (Signed)
Pt. States pt. Is seeing a new doctor this past week.  Pt. Family states pt. Was started on abilify on Friday morning.  Pt. Has had two morning doses of abilify including today.  Pt. Trazodone was reduced from 100 mg at night to 50 mg per family.

## 2016-08-29 NOTE — ED Provider Notes (Signed)
[facility] Emergency Department Provider Note  ____________________________________________  Time seen: Approximately 6:23 PM  I have reviewed the triage vital signs and the nursing notes.   HISTORY  Chief Complaint Altered Mental Status   HPI Jessica [name] is a 71 y.o. female with a history of chronic pain, depression, obesity, recurrent UTIs, hypothyroidism who presents for evaluation of hallucinations and failure to thrive. History is gathered from patient's son and husband who live with her. According to them patient was admitted to the hospital back in March with visual hallucinations. Since then she has been seen by multiple doctors and this is her third visit to the emergency room for visual hallucinations. Patient has never had hallucinations in her life before March. She was treated for a UTI back in the beginning of May with no changes in her neurological status. Patient does have a history of depression. The family said that they were managing her hallucinations at home however today she stopped talking to them and would not eat and that made them concerned. The husband feels that he is unable to care for her anymore. When asked if patient's been depressed and she says "it doesn't matter anymore". Patient thinks that she is being accused of 5 murders. According to the son she has been seeing people in the house, she thinks people are poisoning her food. She has been very delusional. Appetite has been progressively worse. No fever or chills, no vomiting or diarrhea. She has never had hallucinations before with depression. She has never seen by a psychiatrist.   Past Medical History:  Diagnosis Date  . Chronic pain   . Depression   . HLD (hyperlipidemia)   . HTN (hypertension)   . Hypothyroidism   . Morbid obesity (HCC)   . Recurrent UTI   . Spinal stenosis   . Visual hallucinations     Patient Active Problem List   Diagnosis Date Noted  .  Acute encephalopathy 08/14/2016  . Hallucinations 07/06/2016  . Hypokalemia 07/06/2016  . Sinus tachycardia 07/06/2016  . RECTAL PAIN 09/04/2009  . CONSTIPATION 07/08/2009  . DEEP VENOUS THROMBOPHLEBITIS, BILATERAL 01/07/2009  . PALLOR 01/07/2009  . [facility]  . ABDOMINAL OR PELVIC SWELLING MASS OR LUMP LLQ 10/01/2008  . 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 HYPERLIPIDEMIA 12/10/2006  . ANXIETY 12/10/2006  . DEPRESSION 12/10/2006  . HYPERTENSION 12/10/2006  . OSTEOARTHRITIS 12/10/2006  . HYPOTHYROIDISM 09/22/2006  . HYPERCHOLESTEROLEMIA 09/22/2006  . ECZEMA 09/22/2006    Past Surgical History:  Procedure Laterality Date  . ABDOMINAL HYSTERECTOMY    . BACK SURGERY    . HEMORROIDECTOMY      Prior to Admission medications   Medication Sig Start Date End Date Taking? Authorizing Provider  amlodipine-atorvastatin (CADUET) 2.5-10 MG tablet Take 1 tablet by mouth daily.    [provider]  aspirin EC 81 MG tablet Take 81 mg by mouth daily.    [provider]  baclofen (LIORESAL) 20 MG tablet Take 5-20 mg by mouth See admin instructions. tk 20mg  qam, 5mg  @ 1400, and 20mg  qpm    [provider]  docusate sodium (COLACE) 100 MG capsule Take 100 mg by mouth 2 (two) times daily.    [provider]  furosemide (LASIX) 20 MG tablet Take 20 mg by mouth daily.    [provider]  hydrOXYzine (ATARAX/VISTARIL) 25 MG tablet Take 25 mg by mouth 2 (two) times daily as needed for itching.    [provider]  levothyroxine (SYNTHROID, LEVOTHROID) 150 MCG tablet Take 150 mcg by mouth daily before breakfast.    [provider]  potassium chloride SA (K-DUR,KLOR-CON) 20 MEQ tablet Take 30 mEq by mouth daily.     [provider]  pregabalin (LYRICA) 150 MG capsule Take 150 mg by mouth 3 (three) times daily.    [provider]  senna-docusate (SENOKOT-S) 8.6-50 MG tablet Take 2 tablets by mouth daily.     [provider]  Simethicone 180 MG CAPS Take 2 capsules by mouth 3 (three) times daily.    [provider]  sulfamethoxazole-trimethoprim (BACTRIM DS,SEPTRA DS) 800-160 MG tablet Take 1 tablet by mouth 2 (two) times daily. Patient not taking: Reported on 08/24/2016 08/15/16   Adrian Saran, MD  traZODone (DESYREL) 100 MG tablet Take 100 mg by mouth at bedtime.    [provider]  Venlafaxine HCl 225 MG TB24 Take 225 mg by mouth daily.    [provider]  vitamin B-12 (CYANOCOBALAMIN) 1000 MCG tablet Take 1,000 mcg by mouth daily.    [provider]    Allergies Ambien [zolpidem tartrate] and Amoxicillin  Family History  Problem Relation Age of Onset  . Heart disease Father   . Deep vein thrombosis Father     Social History Social History  Substance Use Topics  . Smoking status: Never Smoker  . Smokeless tobacco: Current User    Types: Snuff  . Alcohol use No    Review of Systems  Constitutional: Negative for fever. Eyes: Negative for visual changes. ENT: Negative for sore throat. Neck: No neck pain  Cardiovascular: Negative for chest pain. Respiratory: Negative for shortness of breath. Gastrointestinal: Negative for abdominal pain, vomiting or diarrhea. Genitourinary: Negative for dysuria. Musculoskeletal: Negative for back pain. Skin: Negative for rash. Neurological: Negative for headaches, weakness or numbness. Psych: No SI or HI. + hallucinations  ____________________________________________   PHYSICAL EXAM:  VITAL SIGNS: Vitals:   08/30/16 0808 08/30/16 0900  BP: (!) 149/88 132/70  Pulse:  83  Resp: 12 17  Temp: 98.1 F (36.7 C)    Constitutional: Alert and oriented x2, chronically ill-appearing.  HEENT:      Head: Normocephalic and atraumatic.         Eyes: Conjunctivae are normal. Sclera is non-icteric.       Mouth/Throat: Mucous membranes are dry.       Neck: Supple with no signs of  meningismus. Cardiovascular: Regular rate and rhythm. No murmurs, gallops, or rubs. 2+ symmetrical distal pulses are present in all extremities. No JVD. Respiratory: Normal respiratory effort. Lungs are clear to auscultation bilaterally. No wheezes, crackles, or rhonchi.  Gastrointestinal: Soft, non tender, and non distended with positive bowel sounds. No rebound or guarding. Erythema of the vaginal labia and perineum skin folds. Musculoskeletal: Nontender with normal range of motion in all extremities. No edema, cyanosis, or erythema of extremities. Left big toe nail is removed with small amount of pus at the nailbed, no laceration, no crepitus.  Neurologic: Normal speech and language. Face is symmetric. Moving all extremities. No gross focal neurologic deficits are appreciated. Skin: Skin is warm, dry and intact. No rash noted. Psychiatric: Mood and affect are depressed, poor eye contact.  ____________________________________________   LABS (all labs ordered are listed, but only abnormal results are displayed)  Labs Reviewed  CBC WITH DIFFERENTIAL/PLATELET - Abnormal; Notable for the following:       Result Value   RBC 5.70 (*)    MCV  77.9 (*)    All other components within normal limits  URINALYSIS, COMPLETE (UACMP) WITH MICROSCOPIC - Abnormal; Notable for the following:    Color, Urine YELLOW (*)    APPearance HAZY (*)    Squamous Epithelial / LPF 0-5 (*)    All other components within normal limits  COMPREHENSIVE METABOLIC PANEL - Abnormal; Notable for the following:    Sodium 133 (*)    Chloride 95 (*)    All other components within normal limits  T4, FREE - Abnormal; Notable for the following:    Free T4 1.58 (*)    All other components within normal limits  AEROBIC CULTURE (SUPERFICIAL SPECIMEN)  ETHANOL  URINE DRUG SCREEN, QUALITATIVE ([facility] ONLY)  TSH   ____________________________________________  EKG  ED ECG REPORT I, Nita Sickle, the attending physician,  personally viewed and interpreted this ECG.  Normal sinus rhythm, rate of 75, prolonged QTC, RBBB, right axis deviation, no ST elevations or depressions. Unchanged from prior from 08/15/16  ____________________________________________  RADIOLOGY  XR left toe: Soft tissue swelling in the first toe without bony destruction to suggest osteomyelitis. If clinically indicated nonemergent MRI would be more sensitive for evaluation. ____________________________________________   PROCEDURES  Procedure(s) performed: None Procedures Critical Care performed:  None ____________________________________________   INITIAL IMPRESSION / ASSESSMENT AND PLAN / ED COURSE   71 y.o. female with a history of chronic pain, depression, obesity, recurrent UTIs, hypothyroidism who presents for evaluation of visual hallucinations and failure to thrive. Vitals stable. Moving all extremities, face is symmetric. Patient here before for similar symptoms that have been ongoing for 2 months with negative urinalysis, head CT. I do believe patient's presentation is psychiatric in nature. Patient seems very depressed. Patient is also on a lot of different medications that could cause worsening depression or hallucinations.  We'll check CBC, CMP, ethanol, drug screen, thyroid studies, urinalysis to evaluate for possible etiology of patient's symptoms. will consult psychiatry.    ED COURSE:  Labs with no acute findings. Pending SOC psych eval. XR of toe with no evidence of osteo. Patient started on bactrim for infection. Culture sent. Care transferred to Dr. Roxan Hockey.  Pertinent labs & imaging results that were available during my care of the patient were reviewed by me and considered in my medical decision making (see chart for details).    ____________________________________________   FINAL CLINICAL IMPRESSION(S) / ED DIAGNOSES  Final diagnoses:  Visual hallucinations  Delusion (HCC)  Vaginal yeast infection   Cellulitis of left toe  Depression, unspecified depression type      NEW MEDICATIONS STARTED DURING THIS VISIT:  Discharge Medication List as of 08/30/2016  7:31 AM       Note:  This document was prepared using Dragon voice recognition software and may include unintentional dictation errors.    Nita Sickle, MD 08/30/16 2011

## 2016-08-29 NOTE — ED Notes (Signed)
Pt. Is missing toe nail on great toe of lt. Foot.  Pt. Has a black area on the tip of lt. Great toe.

## 2016-08-29 NOTE — ED Triage Notes (Signed)
Pt came to ED via EMS from home. Per family, pt started hallucinating 45 mins prior to arrival. Pt was seen by PCP, and per family, pcp believes she is going through psychosis from meds. Per family, began tapering meds but unable to tell which meds. Pt able to answer very few questions, not making sense with thoughts.

## 2016-08-29 NOTE — ED Notes (Signed)
Pt. States pt. Had not had a bowel movement in ten days.  Pt. Was given an enema by family last night and had a very large bowel movement.

## 2016-08-29 NOTE — ED Notes (Signed)
Per family pt. Was having hallucinations before abilify.  Pt. States hallucinations started at the end of march.

## 2016-08-30 DIAGNOSIS — F22 Delusional disorders: Secondary | ICD-10-CM | POA: Diagnosis not present

## 2016-08-30 MED ORDER — FLEET ENEMA 7-19 GM/118ML RE ENEM
1.0000 | ENEMA | Freq: Once | RECTAL | Status: AC
  Administered 2016-08-30: 1 via RECTAL

## 2016-08-30 MED ORDER — FLUCONAZOLE 50 MG PO TABS
150.0000 mg | ORAL_TABLET | Freq: Once | ORAL | Status: AC
  Administered 2016-08-30: 08:00:00 150 mg via ORAL
  Filled 2016-08-30: qty 1

## 2016-08-30 NOTE — ED Notes (Signed)
SOC called and was given patient vitals and medical hx.  SOC machine is set up in room.

## 2016-08-30 NOTE — ED Notes (Signed)
Pt. Had a medium bowel movement on chucks in bed.  Pt. Felt better.  Pt. Laid on back after procedure and made comfortable.

## 2016-08-30 NOTE — ED Notes (Signed)
Pt. Soiled brief, pt. Had medium bowel movement.  Pt. Cleaned and changed.

## 2016-08-30 NOTE — ED Provider Notes (Signed)
I assumed care of the patient from Dr. Roxan Hockeyobinson 11:00 PM with associated telepsychiatry pending. At 5:42 AM I received a report from the toe psychiatrist Dr. Hermelinda MedicusAlvarado who diagnosed the patient with depressive disorder and recommended discharge home. I spoke with the patient's son who is in agreement with current plan I recommended that they follow up with Dr. Allena KatzPatel the primary care provider tomorrow.   Darci CurrentBrown, [place] N, MD 08/30/16 [phone]

## 2016-08-30 NOTE — ED Notes (Signed)
Pt. Finished SOC. 

## 2016-09-12 ENCOUNTER — Encounter: Payer: Self-pay | Admitting: Emergency Medicine

## 2016-09-12 ENCOUNTER — Observation Stay
Admission: EM | Admit: 2016-09-12 | Discharge: 2016-09-13 | Disposition: A | Discharge status: Home / Self Care | Payer: Medicare Other | Attending: Internal Medicine | Admitting: Internal Medicine

## 2016-09-12 DIAGNOSIS — N3000 Acute cystitis without hematuria: Secondary | ICD-10-CM | POA: Diagnosis not present

## 2016-09-12 DIAGNOSIS — G8220 Paraplegia, unspecified: Secondary | ICD-10-CM | POA: Insufficient documentation

## 2016-09-12 DIAGNOSIS — R000 Tachycardia, unspecified: Secondary | ICD-10-CM | POA: Diagnosis not present

## 2016-09-12 DIAGNOSIS — I10 Essential (primary) hypertension: Secondary | ICD-10-CM | POA: Diagnosis not present

## 2016-09-12 DIAGNOSIS — I872 Venous insufficiency (chronic) (peripheral): Secondary | ICD-10-CM | POA: Diagnosis not present

## 2016-09-12 DIAGNOSIS — E039 Hypothyroidism, unspecified: Secondary | ICD-10-CM | POA: Diagnosis not present

## 2016-09-12 DIAGNOSIS — E7800 Pure hypercholesterolemia, unspecified: Secondary | ICD-10-CM | POA: Diagnosis not present

## 2016-09-12 DIAGNOSIS — F329 Major depressive disorder, single episode, unspecified: Secondary | ICD-10-CM | POA: Diagnosis not present

## 2016-09-12 DIAGNOSIS — F17290 Nicotine dependence, other tobacco product, uncomplicated: Secondary | ICD-10-CM | POA: Diagnosis not present

## 2016-09-12 DIAGNOSIS — Z79899 Other long term (current) drug therapy: Secondary | ICD-10-CM | POA: Diagnosis not present

## 2016-09-12 DIAGNOSIS — R410 Disorientation, unspecified: Secondary | ICD-10-CM | POA: Diagnosis not present

## 2016-09-12 DIAGNOSIS — R4182 Altered mental status, unspecified: Secondary | ICD-10-CM | POA: Diagnosis present

## 2016-09-12 DIAGNOSIS — Z7982 Long term (current) use of aspirin: Secondary | ICD-10-CM | POA: Diagnosis not present

## 2016-09-12 DIAGNOSIS — E785 Hyperlipidemia, unspecified: Secondary | ICD-10-CM | POA: Insufficient documentation

## 2016-09-12 DIAGNOSIS — Z7401 Bed confinement status: Secondary | ICD-10-CM | POA: Diagnosis not present

## 2016-09-12 DIAGNOSIS — N3001 Acute cystitis with hematuria: Secondary | ICD-10-CM

## 2016-09-12 DIAGNOSIS — F419 Anxiety disorder, unspecified: Secondary | ICD-10-CM | POA: Diagnosis not present

## 2016-09-12 LAB — URINALYSIS, COMPLETE (UACMP) WITH MICROSCOPIC
Bilirubin Urine: NEGATIVE
Glucose, UA: NEGATIVE mg/dL
Ketones, ur: NEGATIVE mg/dL
Nitrite: NEGATIVE
Protein, ur: 30 mg/dL — AB
Specific Gravity, Urine: 1.011 (ref 1.005–1.030)
pH: 5 (ref 5.0–8.0)

## 2016-09-12 LAB — COMPREHENSIVE METABOLIC PANEL
ALT: 16 U/L (ref 14–54)
AST: 26 U/L (ref 15–41)
Albumin: 2.9 g/dL — ABNORMAL LOW (ref 3.5–5.0)
Alkaline Phosphatase: 180 U/L — ABNORMAL HIGH (ref 38–126)
Anion gap: 9 (ref 5–15)
BUN: 10 mg/dL (ref 6–20)
CO2: 28 mmol/L (ref 22–32)
Calcium: 9 mg/dL (ref 8.9–10.3)
Chloride: 101 mmol/L (ref 101–111)
Creatinine, Ser: 0.53 mg/dL (ref 0.44–1.00)
GFR calc Af Amer: >60 mL/min (ref >60)
GFR calc non Af Amer: >60 mL/min (ref >60)
Glucose, Bld: 97 mg/dL (ref 65–99)
Potassium: 3.7 mmol/L (ref 3.5–5.1)
Sodium: 138 mmol/L (ref 135–145)
Total Bilirubin: 0.6 mg/dL (ref 0.3–1.2)
Total Protein: 6.8 g/dL (ref 6.5–8.1)

## 2016-09-12 LAB — CBC
HCT: 39.5 % (ref 35.0–47.0)
Hemoglobin: 13.3 g/dL (ref 12.0–16.0)
MCH: 26.1 pg (ref 26.0–34.0)
MCHC: 33.6 g/dL (ref 32.0–36.0)
MCV: 77.8 fL — ABNORMAL LOW (ref 80.0–100.0)
Platelets: 317 10*3/uL (ref 150–440)
RBC: 5.08 MIL/uL (ref 3.80–5.20)
RDW: 14 % (ref 11.5–14.5)
WBC: 9.7 10*3/uL (ref 3.6–11.0)

## 2016-09-12 LAB — TSH: TSH: 2.317 u[IU]/mL (ref 0.350–4.500)

## 2016-09-12 LAB — TROPONIN I: Troponin I: <0.03 ng/mL (ref <0.03)

## 2016-09-12 MED ORDER — BACLOFEN 10 MG PO TABS
20.0000 mg | ORAL_TABLET | Freq: Two times a day (BID) | ORAL | Status: DC
  Administered 2016-09-12 – 2016-09-13 (×3): 20 mg via ORAL
  Filled 2016-09-12 (×3): qty 2

## 2016-09-12 MED ORDER — DEXTROSE 5 % IV SOLN
INTRAVENOUS | Status: AC
  Filled 2016-09-12: qty 10

## 2016-09-12 MED ORDER — ENOXAPARIN SODIUM 40 MG/0.4ML ~~LOC~~ SOLN
40.0000 mg | SUBCUTANEOUS | Status: DC
  Administered 2016-09-12: 21:00:00 40 mg via SUBCUTANEOUS
  Filled 2016-09-12: qty 0.4

## 2016-09-12 MED ORDER — VENLAFAXINE HCL ER 75 MG PO CP24
225.0000 mg | ORAL_CAPSULE | Freq: Every day | ORAL | Status: DC
  Administered 2016-09-12 – 2016-09-13 (×2): 225 mg via ORAL
  Filled 2016-09-12 (×2): qty 3

## 2016-09-12 MED ORDER — CEFTRIAXONE SODIUM IN DEXTROSE 20 MG/ML IV SOLN
1.0000 g | Freq: Once | INTRAVENOUS | Status: AC
  Administered 2016-09-12: 1 g via INTRAVENOUS

## 2016-09-12 MED ORDER — PREGABALIN 75 MG PO CAPS
150.0000 mg | ORAL_CAPSULE | Freq: Three times a day (TID) | ORAL | Status: DC
  Administered 2016-09-12 – 2016-09-13 (×4): 150 mg via ORAL
  Filled 2016-09-12 (×4): qty 2

## 2016-09-12 MED ORDER — DEXTROSE 5 % IV SOLN
1.0000 g | INTRAVENOUS | Status: DC
  Administered 2016-09-13: 08:00:00 1 g via INTRAVENOUS
  Filled 2016-09-12: qty 10

## 2016-09-12 MED ORDER — ONDANSETRON HCL 4 MG PO TABS: 4.0000 mg | ORAL_TABLET | Freq: Four times a day (QID) | ORAL | Status: DC | PRN

## 2016-09-12 MED ORDER — VENLAFAXINE HCL ER 75 MG PO CP24
225.0000 mg | ORAL_CAPSULE | Freq: Every day | ORAL | Status: DC
  Filled 2016-09-12: qty 3

## 2016-09-12 MED ORDER — LEVOTHYROXINE SODIUM 25 MCG PO TABS
150.0000 ug | ORAL_TABLET | Freq: Every day | ORAL | Status: DC
  Administered 2016-09-12 – 2016-09-13 (×2): 150 ug via ORAL
  Filled 2016-09-12 (×2): qty 1

## 2016-09-12 MED ORDER — ACETAMINOPHEN 325 MG PO TABS: 650.0000 mg | ORAL_TABLET | Freq: Four times a day (QID) | ORAL | Status: DC | PRN

## 2016-09-12 MED ORDER — BACLOFEN 10 MG PO TABS: 5.0000 mg | ORAL_TABLET | ORAL | Status: DC

## 2016-09-12 MED ORDER — TRAZODONE HCL 50 MG PO TABS
50.0000 mg | ORAL_TABLET | Freq: Every day | ORAL | Status: DC
  Administered 2016-09-12: 21:00:00 50 mg via ORAL
  Filled 2016-09-12: qty 1

## 2016-09-12 MED ORDER — FUROSEMIDE 20 MG PO TABS
20.0000 mg | ORAL_TABLET | Freq: Every day | ORAL | Status: DC
  Administered 2016-09-12 – 2016-09-13 (×2): 20 mg via ORAL
  Filled 2016-09-12 (×2): qty 1

## 2016-09-12 MED ORDER — AMLODIPINE BESYLATE 5 MG PO TABS
2.5000 mg | ORAL_TABLET | Freq: Every day | ORAL | Status: DC
  Administered 2016-09-12 – 2016-09-13 (×2): 2.5 mg via ORAL
  Filled 2016-09-12 (×2): qty 1

## 2016-09-12 MED ORDER — ONDANSETRON HCL 4 MG/2ML IJ SOLN: 4.0000 mg | Freq: Four times a day (QID) | INTRAMUSCULAR | Status: DC | PRN

## 2016-09-12 MED ORDER — SIMETHICONE 80 MG PO CHEW
80.0000 mg | CHEWABLE_TABLET | Freq: Four times a day (QID) | ORAL | Status: DC | PRN
  Filled 2016-09-12: qty 1

## 2016-09-12 MED ORDER — ACETAMINOPHEN 650 MG RE SUPP: 650.0000 mg | Freq: Four times a day (QID) | RECTAL | Status: DC | PRN

## 2016-09-12 MED ORDER — ASPIRIN EC 81 MG PO TBEC
81.0000 mg | DELAYED_RELEASE_TABLET | Freq: Every day | ORAL | Status: DC
  Administered 2016-09-12 – 2016-09-13 (×2): 81 mg via ORAL
  Filled 2016-09-12 (×2): qty 1

## 2016-09-12 MED ORDER — ARIPIPRAZOLE 2 MG PO TABS
2.0000 mg | ORAL_TABLET | Freq: Every day | ORAL | Status: DC
  Administered 2016-09-12 – 2016-09-13 (×2): 2 mg via ORAL
  Filled 2016-09-12 (×3): qty 1

## 2016-09-12 MED ORDER — SODIUM CHLORIDE 0.9 % IV SOLN
INTRAVENOUS | Status: AC
  Administered 2016-09-12: 10:00:00 via INTRAVENOUS

## 2016-09-12 MED ORDER — SENNOSIDES-DOCUSATE SODIUM 8.6-50 MG PO TABS
2.0000 | ORAL_TABLET | Freq: Every day | ORAL | Status: DC
  Administered 2016-09-12 – 2016-09-13 (×2): 2 via ORAL
  Filled 2016-09-12 (×2): qty 2

## 2016-09-12 MED ORDER — AMLODIPINE-ATORVASTATIN 2.5-10 MG PO TABS: 1.0000 | ORAL_TABLET | Freq: Every day | ORAL | Status: DC

## 2016-09-12 MED ORDER — TRAZODONE HCL 100 MG PO TABS: 100.0000 mg | ORAL_TABLET | Freq: Every day | ORAL | Status: DC

## 2016-09-12 MED ORDER — VENLAFAXINE HCL ER 75 MG PO CP24: 225.0000 mg | ORAL_CAPSULE | Freq: Every day | ORAL | Status: DC

## 2016-09-12 MED ORDER — ATORVASTATIN CALCIUM 20 MG PO TABS
10.0000 mg | ORAL_TABLET | Freq: Every day | ORAL | Status: DC
  Administered 2016-09-12 – 2016-09-13 (×2): 10 mg via ORAL
  Filled 2016-09-12 (×2): qty 1

## 2016-09-12 MED ORDER — POTASSIUM CHLORIDE CRYS ER 20 MEQ PO TBCR
30.0000 meq | EXTENDED_RELEASE_TABLET | Freq: Every day | ORAL | Status: DC
  Administered 2016-09-12 – 2016-09-13 (×2): 30 meq via ORAL
  Filled 2016-09-12 (×2): qty 1

## 2016-09-12 MED ORDER — POLYETHYLENE GLYCOL 3350 17 G PO PACK: 17.0000 g | PACK | Freq: Every day | ORAL | Status: DC | PRN

## 2016-09-12 MED ORDER — LORAZEPAM 2 MG/ML IJ SOLN
1.0000 mg | Freq: Once | INTRAMUSCULAR | Status: DC
  Filled 2016-09-12: qty 1

## 2016-09-12 MED ORDER — SODIUM CHLORIDE 0.9 % IV BOLUS (SEPSIS)
500.0000 mL | Freq: Once | INTRAVENOUS | Status: AC
  Administered 2016-09-12: 500 mL via INTRAVENOUS

## 2016-09-12 MED ORDER — BACLOFEN 10 MG PO TABS
5.0000 mg | ORAL_TABLET | Freq: Every day | ORAL | Status: DC
  Filled 2016-09-12: qty 1

## 2016-09-12 MED ORDER — VITAMIN B-12 1000 MCG PO TABS
1000.0000 ug | ORAL_TABLET | Freq: Every day | ORAL | Status: DC
  Administered 2016-09-12 – 2016-09-13 (×2): 1000 ug via ORAL
  Filled 2016-09-12 (×2): qty 1

## 2016-09-12 NOTE — Care Management Obs Status (Signed)
MEDICARE OBSERVATION STATUS NOTIFICATION   Patient Details  Name: Jessica [name] MRN: [id number] Date of Birth: 26-Feb-1946   Medicare Observation Status Notification Given:  Yes [facility]([place] Letter)    Jolee Ewingockett,Quan Cybulski A, RN 09/12/2016, 4:03 PM

## 2016-09-12 NOTE — Progress Notes (Signed)
[facility] [place] [facility]   [name]    MR#:  [id number]  DATE OF BIRTH:  Dec 01, 1945  SUBJECTIVE:  CHIEF COMPLAINT:  Patient is resting comfortably, says 'whatever' to most of  questions. Son at bedside  REVIEW OF SYSTEMS:  CONSTITUTIONAL: No fever, fatigue or weakness.  EYES: No blurred or double vision.  EARS, NOSE, AND THROAT: No tinnitus or ear pain.  RESPIRATORY: No cough, shortness of breath, wheezing or hemoptysis.  CARDIOVASCULAR: No chest pain, orthopnea, edema.  GASTROINTESTINAL: No nausea, vomiting, diarrhea or abdominal pain.  GENITOURINARY: No dysuria, hematuria.  MUSCULOSKELETAL: No joint pain or arthritis.   NEUROLOGIC: No tingling, numbness, weakness.  PSYCHIATRY: No anxiety or depression.   DRUG ALLERGIES:   Allergies  Allergen Reactions  . Ambien [Zolpidem Tartrate] Other (See Comments)    Reaction: altered mental status  . Amoxicillin Other (See Comments)    Reaction: blisters in mouth Has patient had a PCN reaction causing immediate rash, facial/tongue/throat swelling, SOB or lightheadedness with hypotension: Yes Has patient had a PCN reaction causing severe rash involving mucus membranes or skin necrosis: No Has patient had a PCN reaction that required hospitalization No Has patient had a PCN reaction occurring within the last 10 years: Yes If all of the above answers are "NO", then may proceed with Cephalosporin use.     VITALS:  Blood pressure 115/61, pulse 84, temperature 98 F (36.7 C), temperature source Oral, resp. rate 18, height 5\' 10"  (1.778 m), weight 89.3 kg (196 lb 14.4 oz), SpO2 95 %.  PHYSICAL EXAMINATION:  GENERAL:  71 y.o.-year-old patient lying in the bed with no acute distress.  EYES: Pupils equal, round, reactive to light and accommodation. No scleral icterus. Extraocular muscles intact.  HEENT: Head atraumatic, normocephalic. Oropharynx and nasopharynx clear.  NECK:  Supple,  no jugular venous distention. No thyroid enlargement, no tenderness.  LUNGS: Normal breath sounds bilaterally, no wheezing, rales,rhonchi or crepitation. No use of accessory muscles of respiration.  CARDIOVASCULAR: S1, S2 normal. No murmurs, rubs, or gallops.  ABDOMEN: Soft, nontender, nondistended. Bowel sounds present.   EXTREMITIES: No pedal edema, cyanosis, or clubbing.  NEUROLOGIC:alert but not cooperative with neuro exam  PSYCHIATRIC: The patient is alert and oriented x2- 3.  SKIN: No obvious rash, lesion, or ulcer.    LABORATORY PANEL:   CBC  Recent Labs Lab 09/12/16 0149  WBC 9.7  HGB 13.3  HCT 39.5  PLT 317   ------------------------------------------------------------------------------------------------------------------  Chemistries   Recent Labs Lab 09/12/16 0149  NA 138  K 3.7  CL 101  CO2 28  GLUCOSE 97  BUN 10  CREATININE 0.53  CALCIUM 9.0  AST 26  ALT 16  ALKPHOS 180*  BILITOT 0.6   ------------------------------------------------------------------------------------------------------------------  Cardiac Enzymes  Recent Labs Lab 09/12/16 0149  TROPONINI <0.03   ------------------------------------------------------------------------------------------------------------------  RADIOLOGY:  No results found.  EKG:   Orders placed or performed during the hospital encounter of 09/12/16  . ED EKG  . ED EKG    ASSESSMENT AND PLAN:   This is a 71 year old female admitted for tachycardia. 1. Tachycardia: Secondary to agitation /dehydration  -hydrated with IV fluids 2. Urinary tract infection: Present on admission; the patient does not meet criteria for sepsis. Continue ceftriaxone. 3. Delirium: The patient has an underlying psychiatric disorder that appears to be worsened with UTI or medical illness. Continue Abilify and trazodone.  4. Hypothyroidism: nmlTSH; continue Synthroid 5. Hypertension: Continue amlodipine 6. Hyperlipidemia continue  statin  therapy 7. Venous insufficiency: Continue Lasix per home regimen  8. DVT prophylaxis: Lovenox 9. GI prophylaxis: None     All the records are reviewed and case discussed with Care Management/Social Workerr. Management plans discussed with the patient, family and they are in agreement.  CODE STATUS: FC   TOTAL TIME TAKING CARE OF THIS PATIENT: 35  minutes.   POSSIBLE D/C IN 1-2 DAYS, DEPENDING ON CLINICAL CONDITION.  Note: This dictation was prepared with Dragon dictation along with smaller phrase technology. Any transcriptional errors that result from this process are unintentional.   Ramonita Lab M.D on 09/12/2016 at 3:21 PM  Between 7am to 6pm - Pager - [phone] After 6pm go to [email or web address] - password [email or web address] [facility]  [place] [facility]  Office  [phone]  CC: Primary care physician; Center, [facility]

## 2016-09-12 NOTE — ED Notes (Signed)
Pt refusing to use toilet or bedpan, request catheter.

## 2016-09-12 NOTE — ED Notes (Signed)
Pharmacy called to send rocephin 
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
## 2016-09-12 NOTE — H&P (Signed)
Jessica [name] is an 71 y.o. female.   Chief Complaint: Altered mental status HPI: The patient well known to the hospitalist service with past medical history significant for spinal stenosis and paraplegia presents to the emergency department with altered mental status. The patient has been hallucinating and agitated. She also seems to be confused at times. This is more florid than her usual baseline delirium. Evaluation in the emergency department revealed urinary tract infection. The patient received ceftriaxone which seemed to calm her agitation to some degree but she continued to have persistent tachycardia which prompted the emergency department staff to call the hospitalist service for admission.  Past Medical History:  Diagnosis Date  . Chronic pain   . Depression   . HLD (hyperlipidemia)   . HTN (hypertension)   . Hypothyroidism   . Morbid obesity ([place])   . Recurrent UTI   . Spinal stenosis   . Visual hallucinations     Past Surgical History:  Procedure Laterality Date  . ABDOMINAL HYSTERECTOMY    . BACK SURGERY    . HEMORROIDECTOMY      Family History  Problem Relation Age of Onset  . Heart disease Father   . Deep vein thrombosis Father    Social History:  reports that she has never smoked. Her smokeless tobacco use includes Snuff. She reports that she does not drink alcohol or use drugs.  Allergies:  Allergies  Allergen Reactions  . Ambien [Zolpidem Tartrate] Other (See Comments)    Reaction: altered mental status  . Amoxicillin Other (See Comments)    Reaction: blisters in mouth Has patient had a PCN reaction causing immediate rash, facial/tongue/throat swelling, SOB or lightheadedness with hypotension: Yes Has patient had a PCN reaction causing severe rash involving mucus membranes or skin necrosis: No Has patient had a PCN reaction that required hospitalization No Has patient had a PCN reaction occurring within the last 10 years: Yes If all of the above  answers are "NO", then may proceed with Cephalosporin use.      (Not in a hospital admission)  Results for orders placed or performed during the hospital encounter of 09/12/16 (from the past 48 hour(s))  Comprehensive metabolic panel     Status: Abnormal   Collection Time: 09/12/16  1:49 AM  Result Value Ref Range   Sodium 138 135 - 145 mmol/L   Potassium 3.7 3.5 - 5.1 mmol/L   Chloride 101 101 - 111 mmol/L   CO2 28 22 - 32 mmol/L   Glucose, Bld 97 65 - 99 mg/dL   BUN 10 6 - 20 mg/dL   Creatinine, Ser 0.53 0.44 - 1.00 mg/dL   Calcium 9.0 8.9 - 10.3 mg/dL   Total Protein 6.8 6.5 - 8.1 g/dL   Albumin 2.9 (L) 3.5 - 5.0 g/dL   AST 26 15 - 41 U/L   ALT 16 14 - 54 U/L   Alkaline Phosphatase 180 (H) 38 - 126 U/L   Total Bilirubin 0.6 0.3 - 1.2 mg/dL   GFR calc non Af Amer >60 >60 mL/min   GFR calc Af Amer >60 >60 mL/min    Comment: (NOTE) The eGFR has been calculated using the CKD EPI equation. This calculation has not been validated in all clinical situations. eGFR's persistently <60 mL/min signify possible Chronic Kidney Disease.    Anion gap 9 5 - 15  CBC     Status: Abnormal   Collection Time: 09/12/16  1:49 AM  Result Value Ref Range  WBC 9.7 3.6 - 11.0 K/uL   RBC 5.08 3.80 - 5.20 MIL/uL   Hemoglobin 13.3 12.0 - 16.0 g/dL   HCT 39.5 35.0 - 47.0 %   MCV 77.8 (L) 80.0 - 100.0 fL   MCH 26.1 26.0 - 34.0 pg   MCHC 33.6 32.0 - 36.0 g/dL   RDW 14.0 11.5 - 14.5 %   Platelets 317 150 - 440 K/uL  Troponin I     Status: None   Collection Time: 09/12/16  1:49 AM  Result Value Ref Range   Troponin I <0.03 <0.03 ng/mL  Urinalysis, Complete w Microscopic     Status: Abnormal   Collection Time: 09/12/16  2:05 AM  Result Value Ref Range   Color, Urine AMBER (A) YELLOW    Comment: BIOCHEMICALS MAY BE AFFECTED BY COLOR   APPearance TURBID (A) CLEAR   Specific Gravity, Urine 1.011 1.005 - 1.030   pH 5.0 5.0 - 8.0   Glucose, UA NEGATIVE NEGATIVE mg/dL   Hgb urine dipstick SMALL  (A) NEGATIVE   Bilirubin Urine NEGATIVE NEGATIVE   Ketones, ur NEGATIVE NEGATIVE mg/dL   Protein, ur 30 (A) NEGATIVE mg/dL   Nitrite NEGATIVE NEGATIVE   Leukocytes, UA LARGE (A) NEGATIVE   RBC / HPF TOO NUMEROUS TO COUNT 0 - 5 RBC/hpf   WBC, UA TOO NUMEROUS TO COUNT 0 - 5 WBC/hpf   Bacteria, UA RARE (A) NONE SEEN   Squamous Epithelial / LPF 0-5 (A) NONE SEEN   WBC Clumps PRESENT    Mucous PRESENT    Budding Yeast PRESENT    No results found.  Review of Systems  Constitutional: Negative for chills and fever.  HENT: Negative for sore throat and tinnitus.   Eyes: Negative for blurred vision and redness.  Respiratory: Negative for cough and shortness of breath.   Cardiovascular: Negative for chest pain, palpitations, orthopnea and PND.  Gastrointestinal: Negative for abdominal pain, diarrhea, nausea and vomiting.  Genitourinary: Negative for dysuria, frequency and urgency.  Musculoskeletal: Negative for joint pain and myalgias.  Skin: Negative for rash.       No lesions  Neurological: Negative for speech change, focal weakness and weakness.  Endo/Heme/Allergies: Does not bruise/bleed easily.       No temperature intolerance  Psychiatric/Behavioral: Positive for hallucinations. Negative for depression and suicidal ideas.    Blood pressure (!) 143/76, pulse 87, temperature 97.9 F (36.6 C), temperature source Oral, resp. rate 20, height '5\' 10"'$  (1.778 m), weight 88 kg (194 lb), SpO2 99 %. Physical Exam  Vitals reviewed. Constitutional: She is oriented to person, place, and time. She appears well-developed and well-nourished.  HENT:  Head: Normocephalic and atraumatic.  Eyes: EOM are normal. Pupils are equal, round, and reactive to light.  Neck: Normal range of motion. No tracheal deviation present. No thyromegaly present.  Cardiovascular: Normal rate, regular rhythm and normal heart sounds.  Exam reveals no gallop and no friction rub.   No murmur heard. Respiratory: Effort normal  and breath sounds normal.  GI: Soft. Bowel sounds are normal. She exhibits no distension. There is no tenderness.  Lymphadenopathy:    She has no cervical adenopathy.  Neurological: She is alert and oriented to person, place, and time. No cranial nerve deficit. She exhibits normal muscle tone.  Skin: Skin is warm and dry.  Psychiatric: She has a normal mood and affect. Judgment normal. Thought content is paranoid.     Assessment/Plan This is a 71 year old female admitted for tachycardia. 1. Tachycardia:  Secondary to agitation from delirium. This is been a problem for her in the past. At time of dictation the patient's heart rate has normalized. Continue to monitor. 2. Urinary tract infection: Present on admission; the patient does not meet criteria for sepsis. Continue ceftriaxone. 3. Delirium: The patient has an underlying psychiatric disorder that appears to be worsened with UTI or medical illness. Continue Abilify and trazodone. I have increased her dose of the latter. 4. Hypothyroidism: Check TSH; continue Synthroid 5. Hypertension: Continue amlodipine 6. Hyperlipidemia continue statin therapy 7. Venous insufficiency: Continue Lasix per home regimen 8. DVT prophylaxis: Lovenox 9. GI prophylaxis: None The patient is a full code. Time spent on admission orders and patient care approximately 45 minutes  Harrie Foreman, MD 09/12/2016, 4:18 AM

## 2016-09-12 NOTE — ED Notes (Signed)
Pt refusing to change into gown, pt refusing to have ekg performed

## 2016-09-12 NOTE — ED Provider Notes (Signed)
[facility] Emergency Department Provider Note   First MD Initiated Contact with Patient 09/12/16 0157     (approximate)  I have reviewed the triage vital signs and the nursing notes.  Level V caveat : History of physical exam limited secondary to altered mental status HISTORY  Chief Complaint Altered Mental Status    HPI ALANEY WITTER is a 71 y.o. female ) medical conditions presents to the emergency department with altered mental status per the patient's son. Patient's son states since last ED visit patient's mental status had improved however for the past day patient's mental status is progressively worsened. Patient admits to discomfort pointing to her suprapubic region. Patient states "I've got rats running inside of me"   Past Medical History:  Diagnosis Date  . Chronic pain   . Depression   . HLD (hyperlipidemia)   . HTN (hypertension)   . Hypothyroidism   . Morbid obesity ([facility])   . Recurrent UTI   . Spinal stenosis   . Visual hallucinations     Patient Active Problem List   Diagnosis Date Noted  . Tachycardia 09/12/2016  . Acute encephalopathy 08/14/2016  . Hallucinations 07/06/2016  . Hypokalemia 07/06/2016  . Sinus tachycardia 07/06/2016  . RECTAL PAIN 09/04/2009  . CONSTIPATION 07/08/2009  . DEEP VENOUS THROMBOPHLEBITIS, BILATERAL 01/07/2009  . PALLOR 01/07/2009  . DIFFICULTY IN [facility] 11/22/2008  . ABDOMINAL OR PELVIC SWELLING MASS OR LUMP LLQ 10/01/2008  . 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 HYPERLIPIDEMIA 12/10/2006  . ANXIETY 12/10/2006  . DEPRESSION 12/10/2006  . HYPERTENSION 12/10/2006  . OSTEOARTHRITIS 12/10/2006  . HYPOTHYROIDISM 09/22/2006  . HYPERCHOLESTEROLEMIA 09/22/2006  . ECZEMA 09/22/2006    Past Surgical History:  Procedure Laterality Date  . ABDOMINAL HYSTERECTOMY    . BACK SURGERY    . HEMORROIDECTOMY      Prior to Admission medications   Medication Sig Start Date End Date Taking? Authorizing  Provider  amlodipine-atorvastatin (CADUET) 2.5-10 MG tablet Take 1 tablet by mouth daily.    [provider]  ARIPiprazole (ABILIFY) 2 MG tablet Take 2 mg by mouth daily. 08/27/16   [provider]  aspirin EC 81 MG tablet Take 81 mg by mouth daily.    [provider]  baclofen (LIORESAL) 20 MG tablet Take 5-20 mg by mouth See admin instructions. tk 20mg  qam, 5mg  @ 1400, and 20mg  qpm    [provider]  docusate sodium (COLACE) 100 MG capsule Take 100 mg by mouth 2 (two) times daily.    [provider]  furosemide (LASIX) 20 MG tablet Take 20 mg by mouth daily.    [provider]  levothyroxine (SYNTHROID, LEVOTHROID) 150 MCG tablet Take 150 mcg by mouth daily before breakfast.    [provider]  potassium chloride SA (K-DUR,KLOR-CON) 20 MEQ tablet Take 30 mEq by mouth daily.     [provider]  pregabalin (LYRICA) 150 MG capsule Take 150 mg by mouth 3 (three) times daily.    [provider]  senna-docusate (SENOKOT-S) 8.6-50 MG tablet Take 2 tablets by mouth daily.    [provider]  Simethicone 180 MG CAPS Take 2 capsules by mouth 3 (three) times daily.    [provider]  traZODone (DESYREL) 100 MG tablet Take 100 mg by mouth at bedtime.    [provider]  Venlafaxine HCl 225 MG TB24 Take 225 mg by mouth daily.    [provider]  vitamin B-12 (CYANOCOBALAMIN) 1000 MCG  tablet Take 1,000 mcg by mouth daily.    [provider]    Allergies Ambien [zolpidem tartrate] and Amoxicillin  Family History  Problem Relation Age of Onset  . Heart disease Father   . Deep vein thrombosis Father     Social History Social History  Substance Use Topics  . Smoking status: Never Smoker  . Smokeless tobacco: Current User    Types: Snuff  . Alcohol use No    Review of Systems Constitutional: No fever/chills Eyes: No visual changes. ENT: No sore throat. Cardiovascular:  Denies chest pain. Respiratory: Denies shortness of breath. Gastrointestinal: No abdominal pain.  No nausea, no vomiting.  No diarrhea.  No constipation. Genitourinary: Negative for dysuria. Musculoskeletal: Negative for neck pain.  Negative for back pain. Integumentary: Negative for rash. Neurological: Negative for headaches, focal weakness or numbness.   ____________________________________________   PHYSICAL EXAM:  VITAL SIGNS: ED Triage Vitals  Enc Vitals Group     BP 09/12/16 0152 133/73     Pulse Rate 09/12/16 0152 93     Resp 09/12/16 0152 18     Temp 09/12/16 0152 97.9 F (36.6 C)     Temp Source 09/12/16 0152 Oral     SpO2 09/12/16 0152 99 %     Weight 09/12/16 0146 88 kg (194 lb)     Height 09/12/16 0146 1.778 m (5\' 10" )     Head Circumference --      Peak Flow --      Pain Score 09/12/16 0146 0     Pain Loc --      Pain Edu? --      Excl. in GC? --     Constitutional: Alert and confused  Eyes: Conjunctivae are normal.  Head: Atraumatic. Mouth/Throat: Mucous membranes are moist.  Oropharynx non-erythematous. Neck: No stridor.   Cardiovascular: Normal rate, regular rhythm. Good peripheral circulation. Grossly normal heart sounds. Respiratory: Normal respiratory effort.  No retractions. Lungs CTAB. Gastrointestinal: Soft and nontender. No distention.  Musculoskeletal: No lower extremity tenderness nor edema. No gross deformities of extremities. Neurologic:  Normal speech and language. No gross focal neurologic deficits are appreciated.  Skin:  Skin is warm, dry and intact. No rash noted. Psychiatric: Mood and affect are normal. Speech and behavior are normal.  ____________________________________________   LABS (all labs ordered are listed, but only abnormal results are displayed)  Labs Reviewed  COMPREHENSIVE METABOLIC PANEL - Abnormal; Notable for the following:       Result Value   Albumin 2.9 (*)    Alkaline Phosphatase 180 (*)    All other  components within normal limits  CBC - Abnormal; Notable for the following:    MCV 77.8 (*)    All other components within normal limits  URINALYSIS, COMPLETE (UACMP) WITH MICROSCOPIC - Abnormal; Notable for the following:    Color, Urine AMBER (*)    APPearance TURBID (*)    Hgb urine dipstick SMALL (*)    Protein, ur 30 (*)    Leukocytes, UA LARGE (*)    Bacteria, UA RARE (*)    Squamous Epithelial / LPF 0-5 (*)    All other components within normal limits  URINE CULTURE  TROPONIN I   ____________________________________________  EKG  ED ECG REPORT I, [place] N Sonnia Strong, the attending physician, personally viewed and interpreted this ECG.   Date: 09/12/2016  EKG Time: 2:25 AM  Rate: 100  Rhythm: Normal sinus rhythm, right bundle branch block  Axis: Normal  Intervals:  Normal  ST&T Change: None   Procedures   ____________________________________________   INITIAL IMPRESSION / ASSESSMENT AND PLAN / ED COURSE  Pertinent labs & imaging results that were available during my care of the patient were reviewed by me and considered in my medical decision making (see chart for details).  71 year old female presenting with altered mental status. Urinalysis consistent with UTI such patient received ceftriaxone. Patient discussed with Dr. Sheryle Hail for hospital admission further evaluation and management.      ____________________________________________  FINAL CLINICAL IMPRESSION(S) / ED DIAGNOSES  Final diagnoses:  Acute cystitis with hematuria  Delirium     MEDICATIONS GIVEN DURING THIS VISIT:  Medications  LORazepam (ATIVAN) injection 1 mg (1 mg Intravenous Refused 09/12/16 0340)  cefTRIAXone (ROCEPHIN) 1 g in dextrose 5 % 50 mL IVPB - Premix (0 g Intravenous Stopped 09/12/16 0340)     NEW OUTPATIENT MEDICATIONS STARTED DURING THIS VISIT:  New Prescriptions   No medications on file    Modified Medications   No medications on file    Discontinued Medications    HYDROXYZINE (ATARAX/VISTARIL) 25 MG TABLET    Take 25 mg by mouth 2 (two) times daily as needed for itching.   SULFAMETHOXAZOLE-TRIMETHOPRIM (BACTRIM DS,SEPTRA DS) 800-160 MG TABLET    Take 1 tablet by mouth 2 (two) times daily.     Note:  This document was prepared using Dragon voice recognition software and may include unintentional dictation errors.    Darci Current, MD 09/12/16 [phone]

## 2016-09-12 NOTE — ED Notes (Addendum)
Per son, pt reports hx of UTIs, dx with one in march and again 2 weeks ago which was treated.  Since then, pt has had worsening paranoia and hallucinations.  PCP thought it could be a result of daily meds, pt was taken of hydroxizine and decreased trazodone, and started on abilify.  Son reports today pt has been more agitated and angry and more tired than usual.

## 2016-09-13 DIAGNOSIS — N3000 Acute cystitis without hematuria: Secondary | ICD-10-CM | POA: Diagnosis not present

## 2016-09-13 LAB — URINE CULTURE: Culture: >=100000 — AB

## 2016-09-13 MED ORDER — FLUCONAZOLE 100 MG PO TABS: 200.0000 mg | ORAL_TABLET | Freq: Every day | ORAL | Status: DC

## 2016-09-13 MED ORDER — FLUCONAZOLE 200 MG PO TABS: 200.0000 mg | ORAL_TABLET | Freq: Every day | ORAL | 0 refills | Status: DC

## 2016-09-13 NOTE — Discharge Summary (Signed)
[facility] Physicians - [place] at [facility]   [name]    MR#:  [id number]  DATE OF BIRTH:  69-11-1945  DATE OF ADMISSION:  09/12/2016 ADMITTING PHYSICIAN: Arnaldo Natal, MD  DATE OF DISCHARGE: 09/13/16 PRIMARY CARE PHYSICIAN: [facility]    ADMISSION DIAGNOSIS:  Delirium [R41.0] Acute cystitis with hematuria [N30.01]  DISCHARGE DIAGNOSIS:  Acute cystitis Chronic bedbound  SECONDARY DIAGNOSIS:   Past Medical History:  Diagnosis Date  . Chronic pain   . Depression   . HLD (hyperlipidemia)   . HTN (hypertension)   . Hypothyroidism   . Morbid obesity (HCC)   . Recurrent UTI   . Spinal stenosis   . Visual hallucinations     HOSPITAL COURSE:   HPI: The patient well known to the hospitalist service with past medical history significant for spinal stenosis and paraplegia presents to the emergency department with altered mental status. The patient has been hallucinating and agitated. She also seems to be confused at times. This is more florid than her usual baseline delirium. Evaluation in the emergency department revealed urinary tract infection. The patient received ceftriaxone which seemed to calm her agitation to some degree but she continued to have persistent tachycardia which prompted the emergency department staff to call the hospitalist service for admission.         1. Tachycardia: Secondary to agitation /dehydration  -hydrated with IV fluids,Improved 2. Urinary tract infection: Present on admission; the patient does not meet criteria for sepsis.  Will discontinue ceftriaxone as the urine culture has revealed yeast discharge with fluconazole.  Family is requesting prophylaxis as patient is getting recurrent UTIs which can be considered by the primary care physician after completing the course of fluconazole   3. Delirium: Clinically improved and at her baseline The patient has an underlying psychiatric  disorder that appears to be worsened with UTI or medical illness. Continue Abilify and trazodone.   4. Hypothyroidism: nmlTSH; continue Synthroid 5. Hypertension: Continue amlodipine 6. Hyperlipidemia continue statin therapy 7. Venous insufficiency: Continue Lasix per home regimen 8. Chronic bedbound status following back surgery-patient has hospital bed and [facility] lift at home social worker will arrange transportation. Spouse and son takes care of the patient at home  8. DVT prophylaxis: Lovenox 9. GI prophylaxis: None  DISCHARGE CONDITIONS:   sta ble  CONSULTS OBTAINED:     PROCEDURES none   DRUG ALLERGIES:   Allergies  Allergen Reactions  . Ambien [Zolpidem Tartrate] Other (See Comments)    Reaction: altered mental status  . Amoxicillin Other (See Comments)    Reaction: blisters in mouth Has patient had a PCN reaction causing immediate rash, facial/tongue/throat swelling, SOB or lightheadedness with hypotension: Yes Has patient had a PCN reaction causing severe rash involving mucus membranes or skin necrosis: No Has patient had a PCN reaction that required hospitalization No Has patient had a PCN reaction occurring within the last 10 years: Yes If all of the above answers are "NO", then may proceed with Cephalosporin use.     DISCHARGE MEDICATIONS:   Current Discharge Medication List    START taking these medications   Details  fluconazole (DIFLUCAN) 200 MG tablet Take 1 tablet (200 mg total) by mouth daily. Qty: 13 tablet, Refills: 0      CONTINUE these medications which have NOT CHANGED   Details  acetaminophen (TYLENOL) 325 MG tablet Take 650 mg by mouth every 6 (six) hours as needed for mild pain.  albuterol (PROVENTIL HFA;VENTOLIN HFA) 108 (90 Base) MCG/ACT inhaler Inhale 2 puffs into the lungs every 4 (four) hours as needed for wheezing or shortness of breath.    albuterol (PROVENTIL) (2.5 MG/3ML) 0.083% nebulizer solution Take 2.5 mg by nebulization  every 6 (six) hours as needed for wheezing or shortness of breath.    amlodipine-atorvastatin (CADUET) 2.5-10 MG tablet Take 1 tablet by mouth daily.    ARIPiprazole (ABILIFY) 2 MG tablet Take 2 mg by mouth daily. Refills: 1    aspirin EC 81 MG tablet Take 81 mg by mouth daily.    baclofen (LIORESAL) 20 MG tablet Take 5-20 mg by mouth See admin instructions. tk 20mg  qam, 5mg  @ 1400, and 20mg  qpm    docusate sodium (COLACE) 100 MG capsule Take 100 mg by mouth 2 (two) times daily.    fluticasone (FLONASE) 50 MCG/ACT nasal spray Place 2 sprays into both nostrils daily as needed (for congestion).    furosemide (LASIX) 20 MG tablet Take 20 mg by mouth daily.    Glycerin, Adult, 2.1 g SUPP Place 1 suppository rectally daily as needed for moderate constipation.    hydrocortisone (ANUSOL-HC) 2.5 % rectal cream Place 1 application rectally at bedtime as needed for hemorrhoids or itching.    hydrocortisone (ANUSOL-HC) 25 MG suppository Place 25 mg rectally daily.    levothyroxine (SYNTHROID, LEVOTHROID) 150 MCG tablet Take 150 mcg by mouth daily before breakfast.    mupirocin ointment (BACTROBAN) 2 % Place 1 application into the nose 2 (two) times daily. Apply to open skin on toes    !! nystatin cream (MYCOSTATIN) Apply 1 application topically 4 (four) times daily. Apply to groin rash Refills: 1    !! nystatin cream (MYCOSTATIN) Apply 1 application topically 2 (two) times daily. Apply to rash in skin folds    penciclovir (DENAVIR) 1 % cream Apply 1 application topically as directed. Apply to lip lesion 5 times daily, as needed.    phenol (CHLORASEPTIC) 1.4 % LIQD Use as directed 5 sprays in the mouth or throat as needed for throat irritation / pain.    polyethylene glycol (MIRALAX / GLYCOLAX) packet Take 17 g by mouth daily.    potassium chloride SA (K-DUR,KLOR-CON) 20 MEQ tablet Take 30 mEq by mouth daily.     pregabalin (LYRICA) 150 MG capsule Take 150 mg by mouth 3 (three) times  daily.    senna-docusate (SENOKOT-S) 8.6-50 MG tablet Take 2 tablets by mouth daily.    Simethicone 180 MG CAPS Take 2 capsules by mouth 3 (three) times daily.    sodium phosphate Pediatric (FLEET) 3.5-9.5 GM/59ML enema Place 1 enema rectally once a week.    traZODone (DESYREL) 100 MG tablet Take 50 mg by mouth at bedtime.     triamcinolone ointment (KENALOG) 0.1 % Apply 1 application topically 2 (two) times daily.    Venlafaxine HCl 225 MG TB24 Take 225 mg by mouth daily.    vitamin B-12 (CYANOCOBALAMIN) 1000 MCG tablet Take 1,000 mcg by mouth daily.    Vitamin D, Ergocalciferol, (DRISDOL) 50000 units CAPS capsule Take 50,000 Units by mouth every 30 (thirty) days.    Zinc Oxide 13 % CREA Apply 1 application topically as needed for irritation.     !! - Potential duplicate medications found. Please discuss with provider.       DISCHARGE INSTRUCTIONS:   Follow-up with primary care physician in a week   DIET:  Cardiac diet  DISCHARGE CONDITION:  Fair  ACTIVITY:  Chronic  bedbound status  OXYGEN:  Home Oxygen: No.   Oxygen Delivery: room air  DISCHARGE LOCATION:  home   If you experience worsening of your admission symptoms, develop shortness of breath, life threatening emergency, suicidal or homicidal thoughts you must seek medical attention immediately by calling 911 or calling your MD immediately  if symptoms less severe.  You Must read complete instructions/literature along with all the possible adverse reactions/side effects for all the Medicines you take and that have been prescribed to you. Take any new Medicines after you have completely understood and accpet all the possible adverse reactions/side effects.   Please note  You were cared for by a hospitalist during your hospital stay. If you have any questions about your discharge medications or the care you received while you were in the hospital after you are discharged, you can call the unit and asked to speak  with the hospitalist on call if the hospitalist that took care of you is not available. Once you are discharged, your primary care physician will handle any further medical issues. Please note that NO REFILLS for any discharge medications will be authorized once you are discharged, as it is imperative that you return to your primary care physician (or establish a relationship with a primary care physician if you do not have one) for your aftercare needs so that they can reassess your need for medications and monitor your lab values.     Today  Chief Complaint  Patient presents with  . Altered Mental Status   Patient is more awake and alert and answering questions appropriately according to the son she is at her baseline. Patient is chronically bedbound  ROS:  CONSTITUTIONAL: Denies fevers, chills. Denies any fatigue, weakness.  EYES: Denies blurry vision, double vision, eye pain. EARS, NOSE, THROAT: Denies tinnitus, ear pain, hearing loss. RESPIRATORY: Denies cough, wheeze, shortness of breath.  CARDIOVASCULAR: Denies chest pain, palpitations, edema.  GASTROINTESTINAL: Denies nausea, vomiting, diarrhea, abdominal pain. Denies bright red blood per rectum. GENITOURINARY: Denies dysuria, hematuria. ENDOCRINE: Denies nocturia or thyroid problems. HEMATOLOGIC AND LYMPHATIC: Denies easy bruising or bleeding. SKIN: Denies rash or lesion. MUSCULOSKELETAL: Has chronic back pain NEUROLOGIC: Chronic bedbound status from spinal stenosis  PSYCHIATRIC: Denies anxiety or depressive symptoms.   VITAL SIGNS:  Blood pressure (!) 130/48, pulse 75, temperature 98 F (36.7 C), temperature source Oral, resp. rate 18, height 5\' 10"  (1.778 m), weight 90.5 kg (199 lb 8 oz), SpO2 97 %.  I/O:    Intake/Output Summary (Last 24 hours) at 09/13/16 1316 Last data filed at 09/13/16 0800  Gross per 24 hour  Intake              240 ml  Output              900 ml  Net             -660 ml    PHYSICAL  EXAMINATION:  GENERAL:  71 y.o.-year-old patient lying in the bed with no acute distress.  EYES: Pupils equal, round, reactive to light and accommodation. No scleral icterus. Extraocular muscles intact.  HEENT: Head atraumatic, normocephalic. Oropharynx and nasopharynx clear.  NECK:  Supple, no jugular venous distention. No thyroid enlargement, no tenderness.  LUNGS: Normal breath sounds bilaterally, no wheezing, rales,rhonchi or crepitation. No use of accessory muscles of respiration.  CARDIOVASCULAR: S1, S2 normal. No murmurs, rubs, or gallops.  ABDOMEN: Soft, non-tender, non-distended. Bowel sounds present. No organomegaly or mass.  EXTREMITIES: No pedal edema, cyanosis,  or clubbing.  NEUROLOGIC: Chronically bedbound Sensation intact. Gait not checked.  PSYCHIATRIC: The patient is alert and oriented x 3.  SKIN: No obvious rash, lesion, or ulcer.   DATA REVIEW:   CBC  Recent Labs Lab 09/12/16 0149  WBC 9.7  HGB 13.3  HCT 39.5  PLT 317    Chemistries   Recent Labs Lab 09/12/16 0149  NA 138  K 3.7  CL 101  CO2 28  GLUCOSE 97  BUN 10  CREATININE 0.53  CALCIUM 9.0  AST 26  ALT 16  ALKPHOS 180*  BILITOT 0.6    Cardiac Enzymes  Recent Labs Lab 09/12/16 0149  TROPONINI <0.03    Microbiology Results  Results for orders placed or performed during the hospital encounter of 09/12/16  Urine culture     Status: Abnormal   Collection Time: 09/12/16  2:05 AM  Result Value Ref Range Status   Specimen Description URINE, CATHETERIZED  Final   Special Requests NONE  Final   Culture >=100,000 COLONIES/mL YEAST (A)  Final   Report Status 09/13/2016 FINAL  Final    RADIOLOGY:  No results found.  EKG:   Orders placed or performed during the hospital encounter of 09/12/16  . ED EKG  . ED EKG      Management plans discussed with the patient, family and they are in agreement.  CODE STATUS:     Code Status Orders        Start     Ordered   09/12/16 0514   Full code  Continuous     09/12/16 0513    Code Status History    Date Active Date Inactive Code Status Order ID Comments User Context   08/14/2016  7:30 PM 08/15/2016  7:42 PM Full Code [id number]  Alford Highland, MD ED   07/06/2016  7:49 AM 07/07/2016  9:05 PM Full Code [id number]  Arnaldo Natal, MD Inpatient    Advance Directive Documentation     Most Recent Value  Type of Advance Directive  Healthcare Power of Attorney  Pre-existing out of facility DNR order (yellow form or pink MOST form)  -  "MOST" Form in Place?  -      TOTAL TIME TAKING CARE OF THIS PATIENT: 45 minutes.   Note: This dictation was prepared with Dragon dictation along with smaller phrase technology. Any transcriptional errors that result from this process are unintentional.   @M[facility] @  on 09/13/2016 at 1:16 PM  Between 7am to 6pm - Pager - [phone]  After 6pm go to [email or web address] - password [email or web address] [facility]  [facility]  Office  [phone]  CC: Primary care physician; [facility]

## 2016-09-13 NOTE — Care Management Note (Signed)
Case Management Note  Patient Details  Name: Jessica [name] MRN: [id number] Date of Birth: [birth year]  Subjective/Objective:     Discussed discharge planning with [name] son who is her caretaker. Son verified that [name] is no longer receiving PACE services, and that [name] son and husband are her only caretakers. A medical necessity for transportation for EMS is on [name] chart per [name] is paraplegic and bedbound. .                Action/Plan:   Expected Discharge Date:  09/13/16               Expected Discharge Plan:   09/13/16  In-House Referral:     Discharge planning Services     Post Acute Care Choice:   son declined Choice offered to:    Son DME Arranged:   NA DME Agency:   NA  HH Arranged:   Son refused HH Agency:   NA  Status of Service:   Completed  If discussed at [employer] Meetings, dates discussed:    Additional Comments:  Symon Norwood A, RN 09/13/2016, 1:31 PM

## 2016-09-13 NOTE — Progress Notes (Signed)
Discharge instructions along with home medications and follow up gone over with patient and husband. Both verbalize that they understood instructions. No prescriptions given to patient. IV and tele removed. Pt being discharged via nonemergent [facility] home on room air, no distress noted. Otilio JeffersonMadelyn S Fenton, RN

## 2016-09-13 NOTE — Discharge Instructions (Signed)
Follow-up with primary care physician in a week ° °

## 2016-12-18 ENCOUNTER — Observation Stay
Admission: EM | Admit: 2016-12-18 | Discharge: 2016-12-20 | Disposition: A | Discharge status: Home / Self Care | Payer: Medicare Other | Attending: Internal Medicine | Admitting: Internal Medicine

## 2016-12-18 ENCOUNTER — Encounter: Payer: Self-pay | Admitting: Emergency Medicine

## 2016-12-18 DIAGNOSIS — E039 Hypothyroidism, unspecified: Secondary | ICD-10-CM | POA: Diagnosis not present

## 2016-12-18 DIAGNOSIS — R4182 Altered mental status, unspecified: Secondary | ICD-10-CM

## 2016-12-18 DIAGNOSIS — Z880 Allergy status to penicillin: Secondary | ICD-10-CM | POA: Insufficient documentation

## 2016-12-18 DIAGNOSIS — I10 Essential (primary) hypertension: Secondary | ICD-10-CM | POA: Diagnosis not present

## 2016-12-18 DIAGNOSIS — E785 Hyperlipidemia, unspecified: Secondary | ICD-10-CM | POA: Diagnosis not present

## 2016-12-18 DIAGNOSIS — G9340 Encephalopathy, unspecified: Secondary | ICD-10-CM | POA: Diagnosis present

## 2016-12-18 DIAGNOSIS — N3000 Acute cystitis without hematuria: Secondary | ICD-10-CM | POA: Diagnosis not present

## 2016-12-18 DIAGNOSIS — G8929 Other chronic pain: Secondary | ICD-10-CM | POA: Insufficient documentation

## 2016-12-18 DIAGNOSIS — L89154 Pressure ulcer of sacral region, stage 4: Secondary | ICD-10-CM | POA: Insufficient documentation

## 2016-12-18 DIAGNOSIS — Z7982 Long term (current) use of aspirin: Secondary | ICD-10-CM | POA: Diagnosis not present

## 2016-12-18 DIAGNOSIS — E876 Hypokalemia: Secondary | ICD-10-CM | POA: Diagnosis not present

## 2016-12-18 DIAGNOSIS — B3749 Other urogenital candidiasis: Secondary | ICD-10-CM | POA: Diagnosis present

## 2016-12-18 DIAGNOSIS — Z79899 Other long term (current) drug therapy: Secondary | ICD-10-CM | POA: Insufficient documentation

## 2016-12-18 LAB — COMPREHENSIVE METABOLIC PANEL
ALT: 15 U/L (ref 14–54)
AST: 20 U/L (ref 15–41)
Albumin: 2.8 g/dL — ABNORMAL LOW (ref 3.5–5.0)
Alkaline Phosphatase: 161 U/L — ABNORMAL HIGH (ref 38–126)
Anion gap: 9 (ref 5–15)
BUN: 12 mg/dL (ref 6–20)
CO2: 30 mmol/L (ref 22–32)
Calcium: 9.1 mg/dL (ref 8.9–10.3)
Chloride: 97 mmol/L — ABNORMAL LOW (ref 101–111)
Creatinine, Ser: 0.6 mg/dL (ref 0.44–1.00)
GFR calc Af Amer: >60 mL/min (ref >60)
GFR calc non Af Amer: >60 mL/min (ref >60)
Glucose, Bld: 113 mg/dL — ABNORMAL HIGH (ref 65–99)
Potassium: 3.4 mmol/L — ABNORMAL LOW (ref 3.5–5.1)
Sodium: 136 mmol/L (ref 135–145)
Total Bilirubin: 0.7 mg/dL (ref 0.3–1.2)
Total Protein: 7.2 g/dL (ref 6.5–8.1)

## 2016-12-18 LAB — URINALYSIS, ROUTINE W REFLEX MICROSCOPIC
Bilirubin Urine: NEGATIVE
Glucose, UA: NEGATIVE mg/dL
Ketones, ur: NEGATIVE mg/dL
Nitrite: NEGATIVE
Protein, ur: 30 mg/dL — AB
Specific Gravity, Urine: 1.008 (ref 1.005–1.030)
pH: 6 (ref 5.0–8.0)

## 2016-12-18 LAB — CBC WITH DIFFERENTIAL/PLATELET
Basophils Absolute: 0.1 10*3/uL (ref 0–0.1)
Basophils Relative: 1 %
Eosinophils Absolute: 0.2 10*3/uL (ref 0–0.7)
Eosinophils Relative: 2 %
HCT: 38.1 % (ref 35.0–47.0)
Hemoglobin: 12.9 g/dL (ref 12.0–16.0)
Lymphocytes Relative: 18 %
Lymphs Abs: 1.9 10*3/uL (ref 1.0–3.6)
MCH: 26.7 pg (ref 26.0–34.0)
MCHC: 33.8 g/dL (ref 32.0–36.0)
MCV: 78.9 fL — ABNORMAL LOW (ref 80.0–100.0)
Monocytes Absolute: 0.8 10*3/uL (ref 0.2–0.9)
Monocytes Relative: 7 %
Neutro Abs: 7.7 10*3/uL — ABNORMAL HIGH (ref 1.4–6.5)
Neutrophils Relative %: 72 %
Platelets: 300 10*3/uL (ref 150–440)
RBC: 4.83 MIL/uL (ref 3.80–5.20)
RDW: 15 % — ABNORMAL HIGH (ref 11.5–14.5)
WBC: 10.7 10*3/uL (ref 3.6–11.0)

## 2016-12-18 MED ORDER — DEXTROSE 5 % IV SOLN
1.0000 g | Freq: Once | INTRAVENOUS | Status: AC
  Administered 2016-12-18: 1 g via INTRAVENOUS
  Filled 2016-12-18: qty 10

## 2016-12-18 NOTE — H&P (Signed)
[facility] Physicians - [place] at [facility]   [name]    MR#:  [id number]  DATE OF BIRTH:  02/02/1946  DATE OF ADMISSION:  12/18/2016  PRIMARY CARE PHYSICIAN: [facility]   REQUESTING/REFERRING PHYSICIAN: Lamont Snowball, MD  CHIEF COMPLAINT:   Chief Complaint  Patient presents with  . Altered Mental Status    HISTORY OF PRESENT ILLNESS:  Jessica [name]  is a 71 y.o. female who presents with confusion and dysuria. Patient has had frequent recurrent UTIs this year. UA suggests UTI again today. Hospitalists were called for admission.  PAST MEDICAL HISTORY:   Past Medical History:  Diagnosis Date  . Chronic pain   . Depression   . HLD (hyperlipidemia)   . HTN (hypertension)   . Hypothyroidism   . Morbid obesity (HCC)   . Recurrent UTI   . Spinal stenosis   . Visual hallucinations     PAST SURGICAL HISTORY:   Past Surgical History:  Procedure Laterality Date  . ABDOMINAL HYSTERECTOMY    . BACK SURGERY    . HEMORROIDECTOMY      SOCIAL HISTORY:   Social History  Substance Use Topics  . Smoking status: Never Smoker  . Smokeless tobacco: Current User    Types: Snuff  . Alcohol use No    FAMILY HISTORY:   Family History  Problem Relation Age of Onset  . Heart disease Father   . Deep vein thrombosis Father     DRUG ALLERGIES:   Allergies  Allergen Reactions  . Ambien [Zolpidem Tartrate] Other (See Comments)    Reaction: altered mental status  . Amoxicillin Other (See Comments)    Reaction: blisters in mouth Has patient had a PCN reaction causing immediate rash, facial/tongue/throat swelling, SOB or lightheadedness with hypotension: Yes Has patient had a PCN reaction causing severe rash involving mucus membranes or skin necrosis: No Has patient had a PCN reaction that required hospitalization No Has patient had a PCN reaction occurring within the last 10 years: Yes If all of the above answers  are "NO", then may proceed with Cephalosporin use.     MEDICATIONS AT HOME:   Prior to Admission medications   Medication Sig Start Date End Date Taking? Authorizing Provider  acetaminophen (TYLENOL) 325 MG tablet Take 650 mg by mouth every 6 (six) hours as needed for mild pain.   Yes [provider]  albuterol (PROVENTIL HFA;VENTOLIN HFA) 108 (90 Base) MCG/ACT inhaler Inhale 2 puffs into the lungs every 4 (four) hours as needed for wheezing or shortness of breath.   Yes [provider]  albuterol (PROVENTIL) (2.5 MG/3ML) 0.083% nebulizer solution Take 2.5 mg by nebulization every 6 (six) hours as needed for wheezing or shortness of breath.   Yes [provider]  amlodipine-atorvastatin (CADUET) 2.5-10 MG tablet Take 1 tablet by mouth daily.   Yes [provider]  ARIPiprazole (ABILIFY) 2 MG tablet Take 2 mg by mouth daily. 08/27/16  Yes [provider]  aspirin EC 81 MG tablet Take 81 mg by mouth daily.   Yes [provider]  baclofen (LIORESAL) 20 MG tablet Take 5-20 mg by mouth See admin instructions. tk  qam,  @ 1400, and  qpm   Yes [provider]  docusate sodium (COLACE) 100 MG capsule Take 100 mg by mouth 2 (two) times daily.   Yes [provider]  fluconazole (DIFLUCAN) 100 MG tablet Take 100 mg by mouth once a week.  Yes [provider]  fluticasone (FLONASE) 50 MCG/ACT nasal spray Place 2 sprays into both nostrils daily as needed (for congestion).   Yes [provider]  furosemide (LASIX) 20 MG tablet Take 20 mg by mouth daily.   Yes [provider]  hydrocortisone (ANUSOL-HC) 2.5 % rectal cream Place 1 application rectally at bedtime as needed for hemorrhoids or itching.   Yes [provider]  levothyroxine (SYNTHROID, LEVOTHROID) 150 MCG tablet Take 150 mcg by mouth daily before breakfast.   Yes [provider]  nystatin cream (MYCOSTATIN) Apply 1  application topically 2 (two) times daily. Apply to rash in skin folds   Yes [provider]  potassium chloride SA (K-DUR,KLOR-CON) 20 MEQ tablet Take 30 mEq by mouth daily.    Yes [provider]  pregabalin (LYRICA) 150 MG capsule Take 150 mg by mouth 3 (three) times daily.   Yes [provider]  senna-docusate (SENOKOT-S) 8.6-50 MG tablet Take 2 tablets by mouth daily.   Yes [provider]  Simethicone 180 MG CAPS Take 2 capsules by mouth 3 (three) times daily.   Yes [provider]  traZODone (DESYREL) 100 MG tablet Take 50 mg by mouth at bedtime.    Yes [provider]  Venlafaxine HCl 225 MG TB24 Take 225 mg by mouth daily.   Yes [provider]  vitamin B-12 (CYANOCOBALAMIN) 1000 MCG tablet Take 1,000 mcg by mouth daily.   Yes [provider]  Vitamin D, Ergocalciferol, (DRISDOL) 50000 units CAPS capsule Take 50,000 Units by mouth every 30 (thirty) days.   Yes [provider]  Zinc Oxide 13 % CREA Apply 1 application topically as needed for irritation.   Yes [provider]  mupirocin ointment (BACTROBAN) 2 % Place 1 application into the nose 2 (two) times daily. Apply to open skin on toes    [provider]  penciclovir (DENAVIR) 1 % cream Apply 1 application topically as directed. Apply to lip lesion 5 times daily, as needed.    [provider]  polyethylene glycol (MIRALAX / GLYCOLAX) packet Take 17 g by mouth daily.    [provider]  triamcinolone ointment (KENALOG) 0.1 % Apply 1 application topically 2 (two) times daily.    [provider]    REVIEW OF SYSTEMS:  Review of Systems  Constitutional: Negative for chills, fever, malaise/fatigue and weight loss.  HENT: Negative for ear pain, hearing loss and tinnitus.   Eyes: Negative for blurred vision, double vision, pain and redness.  Respiratory: Negative for cough, hemoptysis and shortness of breath.    Cardiovascular: Negative for chest pain, palpitations, orthopnea and leg swelling.  Gastrointestinal: Negative for abdominal pain, constipation, diarrhea, nausea and vomiting.  Genitourinary: Positive for dysuria. Negative for frequency and hematuria.  Musculoskeletal: Negative for back pain, joint pain and neck pain.  Skin:       No acne, rash, or lesions  Neurological: Negative for dizziness, tremors, focal weakness and weakness.  Endo/Heme/Allergies: Negative for polydipsia. Does not bruise/bleed easily.  Psychiatric/Behavioral: Negative for depression. The patient is not nervous/anxious and does not have insomnia.      VITAL SIGNS:   Vitals:   12/18/16 2231  BP: (!) 170/95  Pulse: 87  Resp: 18  Temp: 97.6 F (36.4 C)  TempSrc: Oral  SpO2: 98%  Weight: 93 kg (205 lb)  Height: 5\' 10"  (1.778 m)   Wt Readings from Last 3 Encounters:  12/18/16 93 kg (205 lb)  09/13/16  90.5 kg (199 lb 8 oz)  08/29/16 88 kg (194 lb)    PHYSICAL EXAMINATION:  Physical Exam  Vitals reviewed. Constitutional: She is oriented to person, place, and time. She appears well-developed and well-nourished. No distress.  HENT:  Head: Normocephalic and atraumatic.  Mouth/Throat: Oropharynx is clear and moist.  Eyes: Pupils are equal, round, and reactive to light. Conjunctivae and EOM are normal. No scleral icterus.  Neck: Normal range of motion. Neck supple. No JVD present. No thyromegaly present.  Cardiovascular: Normal rate, regular rhythm and intact distal pulses.  Exam reveals no gallop and no friction rub.   No murmur heard. Respiratory: Effort normal and breath sounds normal. No respiratory distress. She has no wheezes. She has no rales.  GI: Soft. Bowel sounds are normal. She exhibits no distension. There is no tenderness.  Musculoskeletal: Normal range of motion. She exhibits no edema.  No arthritis, no gout  Lymphadenopathy:    She has no cervical adenopathy.  Neurological: She is alert and  oriented to person, place, and time. No cranial nerve deficit.  No dysarthria, no aphasia  Skin: Skin is warm and dry. No rash noted. No erythema.  Psychiatric: She has a normal mood and affect. Her behavior is normal. Judgment and thought content normal.    LABORATORY PANEL:   CBC  Recent Labs Lab 12/18/16 2232  WBC 10.7  HGB 12.9  HCT 38.1  PLT 300   ------------------------------------------------------------------------------------------------------------------  Chemistries   Recent Labs Lab 12/18/16 2232  NA 136  K 3.4*  CL 97*  CO2 30  GLUCOSE 113*  BUN 12  CREATININE 0.60  CALCIUM 9.1  AST 20  ALT 15  ALKPHOS 161*  BILITOT 0.7   ------------------------------------------------------------------------------------------------------------------  Cardiac Enzymes No results for input(s): TROPONINI in the last 168 hours. ------------------------------------------------------------------------------------------------------------------  RADIOLOGY:  No results found.  EKG:   Orders placed or performed during the hospital encounter of 12/18/16  . ED EKG  . ED EKG  . EKG 12-Lead  . EKG 12-Lead  . EKG 12-Lead  . EKG 12-Lead    IMPRESSION AND PLAN:  Principal Problem:   UTI (urinary tract infection) - IV antibiotics started, culture sent Active Problems:   Acute encephalopathy - secondary to her UTI, treatment as above   Essential hypertension - continue home meds   Hypothyroidism - home medications   HLD (hyperlipidemia) - continue home meds  All the records are reviewed and case discussed with ED provider. Management plans discussed with the patient and/or family.  DVT PROPHYLAXIS: SubQ lovenox  GI PROPHYLAXIS: None  ADMISSION STATUS: Observation  CODE STATUS: Full Code Status History    Date Active Date Inactive Code Status Order ID Comments User Context   09/12/2016  5:13 AM 09/13/2016  6:12 PM Full Code [id number]  Arnaldo Natal, MD  Inpatient   08/14/2016  7:30 PM 08/15/2016  7:42 PM Full Code [id number]  Alford Highland, MD ED   07/06/2016  7:49 AM 07/07/2016  9:05 PM Full Code [id number]  Arnaldo Natal, MD Inpatient      TOTAL TIME TAKING CARE OF THIS PATIENT: 40 minutes.   Amberia Bayless FIELDING 12/18/2016, 11:50 PM  [employer]  [phone]  CC: Primary care physician; [facility]  Note:  This document was prepared using Conservation officer, historic buildings and may include unintentional dictation errors.

## 2016-12-18 NOTE — ED Provider Notes (Signed)
[facility] Emergency Department Provider Note  ____________________________________________   First MD Initiated Contact with Patient 12/18/16 2301     (approximate)  I have reviewed the triage vital signs and the nursing notes.   HISTORY  Chief Complaint Altered Mental Status  level V exemption history Limited by the patient's altered mental status  HPI Jessica [name] is a 71 y.o. female who is brought to the emergency department by her son for roughly 24 hours of confusion and delirium. He said that throughout the course of today she has had slowly progressive confusion and has intermittently been discussing things that have not happened and seeing things that are not there. She has a complex past medical history including spinal stenosis and has been wheelchair bound for the past 11 years. She is diapered dependent. She has had multiple urinary tract infections this year most recently with a Candida UTI 3 months ago. Her symptoms at that time resolved with Diflucan and she has been taking one dose of Diflucan every Saturday ever since. She was in her usual state of health yesterday when she went to visit her primary care physician.   Past Medical History:  Diagnosis Date  . Chronic pain   . Depression   . HLD (hyperlipidemia)   . HTN (hypertension)   . Hypothyroidism   . Morbid obesity (HCC)   . Recurrent UTI   . Spinal stenosis   . Visual hallucinations     Patient Active Problem List   Diagnosis Date Noted  . UTI (urinary tract infection) 12/18/2016  . Tachycardia 09/12/2016  . Acute encephalopathy 08/14/2016  . Hallucinations 07/06/2016  . Hypokalemia 07/06/2016  . Sinus tachycardia 07/06/2016  . RECTAL PAIN 09/04/2009  . CONSTIPATION 07/08/2009  . DEEP VENOUS THROMBOPHLEBITIS, BILATERAL 01/07/2009  . PALLOR 01/07/2009  . DIFFICULTY IN [facility] 11/22/2008  . ABDOMINAL OR PELVIC SWELLING MASS OR LUMP LLQ 10/01/2008  . LUMBAR  RADICULOPATHY 09/25/2008  . HLD (hyperlipidemia) 12/10/2006  . ANXIETY 12/10/2006  . DEPRESSION 12/10/2006  . Essential hypertension 12/10/2006  . OSTEOARTHRITIS 12/10/2006  . Hypothyroidism 09/22/2006  . HYPERCHOLESTEROLEMIA 09/22/2006  . ECZEMA 09/22/2006    Past Surgical History:  Procedure Laterality Date  . ABDOMINAL HYSTERECTOMY    . BACK SURGERY    . HEMORROIDECTOMY      Prior to Admission medications   Medication Sig Start Date End Date Taking? Authorizing Provider  acetaminophen (TYLENOL) 325 MG tablet Take 650 mg by mouth every 6 (six) hours as needed for mild pain.   Yes [provider]  albuterol (PROVENTIL HFA;VENTOLIN HFA) 108 (90 Base) MCG/ACT inhaler Inhale 2 puffs into the lungs every 4 (four) hours as needed for wheezing or shortness of breath.   Yes [provider]  albuterol (PROVENTIL) (2.5 MG/3ML) 0.083% nebulizer solution Take 2.5 mg by nebulization every 6 (six) hours as needed for wheezing or shortness of breath.   Yes [provider]  amlodipine-atorvastatin (CADUET) 2.5-10 MG tablet Take 1 tablet by mouth daily.   Yes [provider]  ARIPiprazole (ABILIFY) 2 MG tablet Take 2 mg by mouth daily. 08/27/16  Yes [provider]  aspirin EC 81 MG tablet Take 81 mg by mouth daily.   Yes [provider]  baclofen (LIORESAL) 20 MG tablet Take 5-20 mg by mouth See admin instructions. tk  qam,  @ 1400, and  qpm   Yes [provider]  docusate sodium (COLACE) 100 MG capsule Take 100 mg by mouth  2 (two) times daily.   Yes [provider]  fluconazole (DIFLUCAN) 100 MG tablet Take 100 mg by mouth once a week.   Yes [provider]  fluticasone (FLONASE) 50 MCG/ACT nasal spray Place 2 sprays into both nostrils daily as needed (for congestion).   Yes [provider]  furosemide (LASIX) 20 MG tablet Take 20 mg by mouth daily.   Yes [provider]  hydrocortisone  (ANUSOL-HC) 2.5 % rectal cream Place 1 application rectally at bedtime as needed for hemorrhoids or itching.   Yes [provider]  levothyroxine (SYNTHROID, LEVOTHROID) 150 MCG tablet Take 150 mcg by mouth daily before breakfast.   Yes [provider]  nystatin cream (MYCOSTATIN) Apply 1 application topically 2 (two) times daily. Apply to rash in skin folds   Yes [provider]  potassium chloride SA (K-DUR,KLOR-CON) 20 MEQ tablet Take 30 mEq by mouth daily.    Yes [provider]  pregabalin (LYRICA) 150 MG capsule Take 150 mg by mouth 3 (three) times daily.   Yes [provider]  senna-docusate (SENOKOT-S) 8.6-50 MG tablet Take 2 tablets by mouth daily.   Yes [provider]  Simethicone 180 MG CAPS Take 2 capsules by mouth 3 (three) times daily.   Yes [provider]  traZODone (DESYREL) 100 MG tablet Take 50 mg by mouth at bedtime.    Yes [provider]  Venlafaxine HCl 225 MG TB24 Take 225 mg by mouth daily.   Yes [provider]  vitamin B-12 (CYANOCOBALAMIN) 1000 MCG tablet Take 1,000 mcg by mouth daily.   Yes [provider]  Vitamin D, Ergocalciferol, (DRISDOL) 50000 units CAPS capsule Take 50,000 Units by mouth every 30 (thirty) days.   Yes [provider]  Zinc Oxide 13 % CREA Apply 1 application topically as needed for irritation.   Yes [provider]  mupirocin ointment (BACTROBAN) 2 % Place 1 application into the nose 2 (two) times daily. Apply to open skin on toes    [provider]  penciclovir (DENAVIR) 1 % cream Apply 1 application topically as directed. Apply to lip lesion 5 times daily, as needed.    [provider]  polyethylene glycol (MIRALAX / GLYCOLAX) packet Take 17 g by mouth daily.    [provider]  triamcinolone ointment (KENALOG) 0.1 % Apply 1 application topically 2 (two) times daily.    [provider]     Allergies Ambien [zolpidem tartrate] and Amoxicillin  Family History  Problem Relation Age of Onset  . Heart disease Father   . Deep vein thrombosis Father     Social History Social History  Substance Use Topics  . Smoking status: Never Smoker  . Smokeless tobacco: Current User    Types: Snuff  . Alcohol use No    Review of Systems level V exemption history Limited by the patient's altered mental status  ____________________________________________   PHYSICAL EXAM:  VITAL SIGNS: ED Triage Vitals [12/18/16 2231]  Enc Vitals Group     BP (!) 170/95     Pulse Rate 87     Resp 18     Temp 97.6 F (36.4 C)     Temp Source Oral     SpO2 98 %     Weight 205 lb (93 kg)     Height 5\' 10"  (1.778 m)     Head Circumference      Peak Flow      Pain Score  Pain Loc      Pain Edu?      Excl. in GC?     Constitutional: alert and oriented 4. Speaks in slow methodical sentences no acute distress Eyes: PERRL EOMI.pupils midrange and brisk Head: Atraumatic. Nose: No congestion/rhinnorhea. Mouth/Throat: No trismus Neck: No stridor.   Cardiovascular: Normal rate, regular rhythm. Grossly normal heart sounds.  Good peripheral circulation. Respiratory: Normal respiratory effort.  No retractions. Lungs CTAB and moving good air Gastrointestinal: obese abdomen mild suprapubic tenderness no costovertebral tenderness Musculoskeletal: No lower extremity edema   Neurologic:  . No gross focal neurologic deficits are appreciated. Skin:  Skin is warm, dry and intact. No rash noted. Psychiatric: flat affect   ____________________________________________   DIFFERENTIAL includes but not limited to  urinary tract infection, pyelonephritis, metabolic derangement, acute coronary syndrome ____________________________________________   LABS (all labs ordered are listed, but only abnormal results are displayed)  Labs Reviewed  COMPREHENSIVE METABOLIC PANEL - Abnormal; Notable  for the following:       Result Value   Potassium 3.4 (*)    Chloride 97 (*)    Glucose, Bld 113 (*)    Albumin 2.8 (*)    Alkaline Phosphatase 161 (*)    All other components within normal limits  URINALYSIS, ROUTINE W REFLEX MICROSCOPIC - Abnormal; Notable for the following:    Color, Urine AMBER (*)    APPearance TURBID (*)    Hgb urine dipstick SMALL (*)    Protein, ur 30 (*)    Leukocytes, UA LARGE (*)    Bacteria, UA RARE (*)    Squamous Epithelial / LPF 6-30 (*)    All other components within normal limits  CBC WITH DIFFERENTIAL/PLATELET - Abnormal; Notable for the following:    MCV 78.9 (*)    RDW 15.0 (*)    Neutro Abs 7.7 (*)    All other components within normal limits  BASIC METABOLIC PANEL  CBC    urinalysis is consistent with infection __________________________________________  EKG  ED ECG REPORT I, Merrily Brittle, the attending physician, personally viewed and interpreted this ECG.  Date: 12/18/2016 Rate: 89 Rhythm: normal sinus rhythm QRS Axis: rightward axis Intervals: normal ST/T Wave abnormalities: normal Narrative Interpretation: normal sinus rhythm with bifascicular block no signs of acute ischemia although shiver artifact makes interpretation somewhat difficult _________________________________________  RADIOLOGY   ____________________________________________   PROCEDURES  Procedure(s) performed: no  Procedures  Critical Care performed: no  Observation: no ____________________________________________   INITIAL IMPRESSION / ASSESSMENT AND PLAN / ED COURSE  Pertinent labs & imaging results that were available during my care of the patient were reviewed by me and considered in my medical decision making (see chart for details).  The patient arrives hemodynamically stable and alert and oriented 4 although clearly confused. Differential is broad and includes infectious etiology. Urinalysis and blood work are pending.       ____----------------------------------------- 11:42 PM on 12/18/2016 -----------------------------------------  The patient's urinalysis is consistent with infection. There is no yeast at this time. She is continuing to be delirious and has an active infection she requires IV antibiotics and inpatient admission.________________________________________   FINAL CLINICAL IMPRESSION(S) / ED DIAGNOSES  Final diagnoses:  Acute cystitis without hematuria  Altered mental status, unspecified altered mental status type      NEW MEDICATIONS STARTED DURING THIS VISIT:  Current Discharge Medication List       Note:  This document was prepared using Dragon voice recognition software and may include unintentional dictation errors.  Merrily Brittle, MD 12/19/16 [phone]

## 2016-12-18 NOTE — ED Triage Notes (Signed)
Patient presents to Emergency Department via EMS with complaints of AMS per family.  Same reports frequent hx of UTI d/t hx of bed ridden (unknown cause) for the last 11 years.   Pt is oriented and alert x 4.

## 2016-12-19 DIAGNOSIS — L89154 Pressure ulcer of sacral region, stage 4: Secondary | ICD-10-CM | POA: Insufficient documentation

## 2016-12-19 LAB — BASIC METABOLIC PANEL
Anion gap: 8 (ref 5–15)
BUN: 11 mg/dL (ref 6–20)
CO2: 31 mmol/L (ref 22–32)
Calcium: 9 mg/dL (ref 8.9–10.3)
Chloride: 98 mmol/L — ABNORMAL LOW (ref 101–111)
Creatinine, Ser: 0.57 mg/dL (ref 0.44–1.00)
GFR calc Af Amer: >60 mL/min (ref >60)
GFR calc non Af Amer: >60 mL/min (ref >60)
Glucose, Bld: 101 mg/dL — ABNORMAL HIGH (ref 65–99)
Potassium: 3.4 mmol/L — ABNORMAL LOW (ref 3.5–5.1)
Sodium: 137 mmol/L (ref 135–145)

## 2016-12-19 LAB — CBC
HCT: 35.9 % (ref 35.0–47.0)
Hemoglobin: 12.2 g/dL (ref 12.0–16.0)
MCH: 26.7 pg (ref 26.0–34.0)
MCHC: 33.9 g/dL (ref 32.0–36.0)
MCV: 78.8 fL — ABNORMAL LOW (ref 80.0–100.0)
Platelets: 309 10*3/uL (ref 150–440)
RBC: 4.56 MIL/uL (ref 3.80–5.20)
RDW: 15.1 % — ABNORMAL HIGH (ref 11.5–14.5)
WBC: 10.3 10*3/uL (ref 3.6–11.0)

## 2016-12-19 LAB — MAGNESIUM: Magnesium: 2 mg/dL (ref 1.7–2.4)

## 2016-12-19 MED ORDER — POTASSIUM CHLORIDE CRYS ER 20 MEQ PO TBCR
40.0000 meq | EXTENDED_RELEASE_TABLET | Freq: Once | ORAL | Status: AC
  Administered 2016-12-19: 40 meq via ORAL
  Filled 2016-12-19: qty 2

## 2016-12-19 MED ORDER — AMLODIPINE BESYLATE 5 MG PO TABS
2.5000 mg | ORAL_TABLET | Freq: Every day | ORAL | Status: DC
  Administered 2016-12-19 – 2016-12-20 (×2): 2.5 mg via ORAL
  Filled 2016-12-19 (×2): qty 1

## 2016-12-19 MED ORDER — VENLAFAXINE HCL ER 75 MG PO CP24
225.0000 mg | ORAL_CAPSULE | Freq: Every day | ORAL | Status: DC
  Administered 2016-12-19 – 2016-12-20 (×2): 225 mg via ORAL
  Filled 2016-12-19 (×2): qty 3

## 2016-12-19 MED ORDER — ARIPIPRAZOLE 2 MG PO TABS
2.0000 mg | ORAL_TABLET | Freq: Every day | ORAL | Status: DC
  Administered 2016-12-19 – 2016-12-20 (×2): 2 mg via ORAL
  Filled 2016-12-19 (×2): qty 1

## 2016-12-19 MED ORDER — ASPIRIN EC 81 MG PO TBEC
81.0000 mg | DELAYED_RELEASE_TABLET | Freq: Every day | ORAL | Status: DC
  Administered 2016-12-19 – 2016-12-20 (×2): 81 mg via ORAL
  Filled 2016-12-19 (×2): qty 1

## 2016-12-19 MED ORDER — FUROSEMIDE 20 MG PO TABS
20.0000 mg | ORAL_TABLET | Freq: Every day | ORAL | Status: DC
  Administered 2016-12-19 – 2016-12-20 (×2): 20 mg via ORAL
  Filled 2016-12-19 (×2): qty 1

## 2016-12-19 MED ORDER — ATORVASTATIN CALCIUM 10 MG PO TABS
10.0000 mg | ORAL_TABLET | Freq: Every day | ORAL | Status: DC
  Administered 2016-12-19: 10 mg via ORAL
  Filled 2016-12-19: qty 1

## 2016-12-19 MED ORDER — ONDANSETRON HCL 4 MG/2ML IJ SOLN: 4.0000 mg | Freq: Four times a day (QID) | INTRAMUSCULAR | Status: DC | PRN

## 2016-12-19 MED ORDER — ACETAMINOPHEN 650 MG RE SUPP: 650.0000 mg | Freq: Four times a day (QID) | RECTAL | Status: DC | PRN

## 2016-12-19 MED ORDER — AMLODIPINE-ATORVASTATIN 2.5-10 MG PO TABS: 1.0000 | ORAL_TABLET | Freq: Every day | ORAL | Status: DC

## 2016-12-19 MED ORDER — BACLOFEN 20 MG PO TABS
20.0000 mg | ORAL_TABLET | Freq: Two times a day (BID) | ORAL | Status: DC
  Administered 2016-12-19: 20 mg via ORAL
  Administered 2016-12-19: 10 mg via ORAL
  Administered 2016-12-20: 20 mg via ORAL
  Filled 2016-12-19 (×3): qty 1

## 2016-12-19 MED ORDER — TRAZODONE HCL 50 MG PO TABS
50.0000 mg | ORAL_TABLET | Freq: Every day | ORAL | Status: DC
  Administered 2016-12-19: 50 mg via ORAL
  Filled 2016-12-19: qty 1

## 2016-12-19 MED ORDER — BACLOFEN 5 MG HALF TABLET
5.0000 mg | ORAL_TABLET | Freq: Every day | ORAL | Status: DC
  Administered 2016-12-19: 5 mg via ORAL
  Filled 2016-12-19 (×2): qty 1

## 2016-12-19 MED ORDER — DEXTROSE 5 % IV SOLN
2.0000 g | INTRAVENOUS | Status: DC
  Administered 2016-12-19: 2 g via INTRAVENOUS
  Filled 2016-12-19: qty 2

## 2016-12-19 MED ORDER — LEVOTHYROXINE SODIUM 50 MCG PO TABS
150.0000 ug | ORAL_TABLET | Freq: Every day | ORAL | Status: DC
  Administered 2016-12-20: 150 ug via ORAL
  Filled 2016-12-19: qty 1

## 2016-12-19 MED ORDER — ONDANSETRON HCL 4 MG PO TABS: 4.0000 mg | ORAL_TABLET | Freq: Four times a day (QID) | ORAL | Status: DC | PRN

## 2016-12-19 MED ORDER — BACLOFEN 5 MG HALF TABLET: 5.0000 mg | ORAL_TABLET | ORAL | Status: DC

## 2016-12-19 MED ORDER — ACETAMINOPHEN 325 MG PO TABS
650.0000 mg | ORAL_TABLET | Freq: Four times a day (QID) | ORAL | Status: DC | PRN
Start: 2016-12-19 — End: 2016-12-20

## 2016-12-19 MED ORDER — PREGABALIN 75 MG PO CAPS
150.0000 mg | ORAL_CAPSULE | Freq: Three times a day (TID) | ORAL | Status: DC
Start: 2016-12-19 — End: 2016-12-20
  Administered 2016-12-19 – 2016-12-20 (×4): 150 mg via ORAL
  Filled 2016-12-19 (×4): qty 2

## 2016-12-19 MED ORDER — ENOXAPARIN SODIUM 40 MG/0.4ML ~~LOC~~ SOLN
40.0000 mg | SUBCUTANEOUS | Status: DC
  Administered 2016-12-19: 40 mg via SUBCUTANEOUS
  Filled 2016-12-19: qty 0.4

## 2016-12-19 NOTE — Progress Notes (Signed)
Patient resting, son at bedside. No complaints or signs of distress. VSS. Continue to monitor.

## 2016-12-19 NOTE — Progress Notes (Signed)
[employer] - [place] [facility]   [name]    MR#:  [id number]  DATE OF BIRTH:  09-Oct-1945  SUBJECTIVE:  CHIEF COMPLAINT:   Chief Complaint  Patient presents with  . Altered Mental Status   The patient has no complaints except generalized weakness. REVIEW OF SYSTEMS:  Review of Systems  Constitutional: Positive for malaise/fatigue. Negative for chills and fever.  HENT: Negative for sore throat.   Eyes: Negative for blurred vision and double vision.  Respiratory: Negative for cough, hemoptysis, shortness of breath, wheezing and stridor.   Cardiovascular: Negative for chest pain, palpitations, orthopnea and leg swelling.  Gastrointestinal: Negative for abdominal pain, blood in stool, diarrhea, melena, nausea and vomiting.  Genitourinary: Negative for dysuria, flank pain and hematuria.  Musculoskeletal: Negative for back pain and joint pain.  Neurological: Positive for weakness. Negative for dizziness, sensory change, focal weakness, seizures, loss of consciousness and headaches.  Endo/Heme/Allergies: Negative for polydipsia.  Psychiatric/Behavioral: Negative for depression. The patient is not nervous/anxious.     DRUG ALLERGIES:   Allergies  Allergen Reactions  . Ambien [Zolpidem Tartrate] Other (See Comments)    Reaction: altered mental status  . Amoxicillin Other (See Comments)    Reaction: blisters in mouth Has patient had a PCN reaction causing immediate rash, facial/tongue/throat swelling, SOB or lightheadedness with hypotension: Yes Has patient had a PCN reaction causing severe rash involving mucus membranes or skin necrosis: No Has patient had a PCN reaction that required hospitalization No Has patient had a PCN reaction occurring within the last 10 years: Yes If all of the above answers are "NO", then may proceed with Cephalosporin use.    VITALS:  Blood pressure (!) 152/73, pulse 88, temperature 98.3 F (36.8 C),  temperature source Oral, resp. rate 18, height  (1.778 m), weight 205 lb (93 kg), SpO2 95 %. PHYSICAL EXAMINATION:  Physical Exam  Constitutional: She is oriented to person, place, and time and well-developed, well-nourished, and in no distress.  HENT:  Head: Normocephalic.  Mouth/Throat: Oropharynx is clear and moist.  Eyes: Pupils are equal, round, and reactive to light. Conjunctivae and EOM are normal. No scleral icterus.  Neck: Normal range of motion. Neck supple. No JVD present. No tracheal deviation present.  Cardiovascular: Normal rate, regular rhythm and normal heart sounds.  Exam reveals no gallop.   No murmur heard. Pulmonary/Chest: Effort normal and breath sounds normal. No respiratory distress. She has no wheezes. She has no rales.  Abdominal: Soft. Bowel sounds are normal. She exhibits no distension. There is no tenderness. There is no rebound.  Musculoskeletal: She exhibits no edema or tenderness.  Unable to move bilateral lower extremities  Neurological: She is alert and oriented to person, place, and time. No cranial nerve deficit.  Bed bound  Skin: No rash noted. No erythema.  Psychiatric: Affect normal.   LABORATORY PANEL:  Female CBC  Recent Labs Lab 12/19/16 0416  WBC 10.3  HGB 12.2  HCT 35.9  PLT 309   ------------------------------------------------------------------------------------------------------------------ Chemistries   Recent Labs Lab 12/18/16 2232 12/19/16 0416  NA 136 137  K 3.4* 3.4*  CL 97* 98*  CO2 30 31  GLUCOSE 113* 101*  BUN 12 11  CREATININE 0.60 0.57  CALCIUM 9.1 9.0  MG  --  2.0  AST 20  --   ALT 15  --   ALKPHOS 161*  --   BILITOT 0.7  --    RADIOLOGY:  No results found. ASSESSMENT  AND PLAN:    UTI (urinary tract infection)  Continue IV Rocephin and follow-up urine culture.    Acute encephalopathy - secondary to her UTI, treatment as above. Improved.    Essential hypertension - continue home meds    Hypothyroidism - home medications   HLD (hyperlipidemia) - continue home meds  Hypokalemia. Give potassium supplement. Magnesium is normal.  All the records are reviewed and case discussed with Care Management/Social Worker. Management plans discussed with the patient,her son and husband and they are in agreement.  CODE STATUS: Full Code  TOTAL TIME TAKING CARE OF THIS PATIENT: 27 minutes.   More than 50% of the time was spent in counseling/coordination of care: YES  POSSIBLE D/C IN 1 DAYS, DEPENDING ON CLINICAL CONDITION.   Shaune Pollackhen, Calton Harshfield M.D on 12/19/2016 at 2:45 PM  Between 7am to 6pm - Pager - [phone]  After 6pm go to [email or web address] - Therapist, nutritionalpassword [email or web address]  [facility] [place] [facility]

## 2016-12-19 NOTE — Progress Notes (Signed)
Pharmacy Antibiotic Note  Jessica [name] is a 71 y.o. female admitted on 12/18/2016 with UTI.  Pharmacy has been consulted for ceftriaxone dosing.  Plan: Ceftriaxone 2 grams q 24 hours ordered  Height: 5\' 10"  (177.8 cm) Weight: 205 lb (93 kg) IBW/kg (Calculated) : 68.5  Temp (24hrs), Avg:97.6 F (36.4 C), Min:97.6 F (36.4 C), Max:97.6 F (36.4 C)   Recent Labs Lab 12/18/16 2232  WBC 10.7  CREATININE 0.60    Estimated Creatinine Clearance: 82 mL/min (by C-G formula based on SCr of 0.6 mg/dL).    Allergies  Allergen Reactions  . Ambien [Zolpidem Tartrate] Other (See Comments)    Reaction: altered mental status  . Amoxicillin Other (See Comments)    Reaction: blisters in mouth Has patient had a PCN reaction causing immediate rash, facial/tongue/throat swelling, SOB or lightheadedness with hypotension: Yes Has patient had a PCN reaction causing severe rash involving mucus membranes or skin necrosis: No Has patient had a PCN reaction that required hospitalization No Has patient had a PCN reaction occurring within the last 10 years: Yes If all of the above answers are "NO", then may proceed with Cephalosporin use.     Antimicrobials this admission: Ceftriaxone 9/7  >>    >>   Dose adjustments this admission:   Microbiology results: 9/7 UCx: pending       9/7 UA: LE(+) NO2(-)  WBC TNTC Thank you for allowing pharmacy to be a part of this patient's care.  Briyanna Billingham S 12/19/2016 12:32 AM

## 2016-12-19 NOTE — ED Notes (Signed)
Pt transport to 149 

## 2016-12-20 LAB — URINE CULTURE: Culture: >=100000 — AB

## 2016-12-20 MED ORDER — FLUCONAZOLE 200 MG PO TABS: 200.0000 mg | ORAL_TABLET | Freq: Every day | ORAL | 0 refills | Status: DC

## 2016-12-20 MED ORDER — CIPROFLOXACIN HCL 500 MG PO TABS
500.0000 mg | ORAL_TABLET | Freq: Two times a day (BID) | ORAL | Status: DC
  Administered 2016-12-20: 500 mg via ORAL
  Filled 2016-12-20: qty 1

## 2016-12-20 MED ORDER — FLUCONAZOLE 100 MG PO TABS
150.0000 mg | ORAL_TABLET | Freq: Every day | ORAL | Status: DC
  Filled 2016-12-20: qty 1.5

## 2016-12-20 MED ORDER — CIPROFLOXACIN HCL 500 MG PO TABS: 500.0000 mg | ORAL_TABLET | Freq: Two times a day (BID) | ORAL | 0 refills | Status: DC

## 2016-12-20 MED ORDER — FLUCONAZOLE 100 MG PO TABS
200.0000 mg | ORAL_TABLET | Freq: Every day | ORAL | Status: DC
  Administered 2016-12-20: 200 mg via ORAL
  Filled 2016-12-20: qty 1

## 2016-12-20 MED ORDER — DEXTROSE 5 % IV SOLN
1.0000 g | INTRAVENOUS | Status: DC
  Administered 2016-12-20: 1 g via INTRAVENOUS
  Filled 2016-12-20: qty 10

## 2016-12-20 NOTE — Discharge Summary (Signed)
[employer] - [place] at [facility]   [name]    MR#:  [id number]  DATE OF BIRTH:  July 05, 1945  DATE OF ADMISSION:  12/18/2016   ADMITTING PHYSICIAN: Oralia Manis, MD  DATE OF DISCHARGE: 12/20/2016 12:50 PM  PRIMARY CARE PHYSICIAN: [facility]   ADMISSION DIAGNOSIS:  Acute cystitis without hematuria [N30.00] Altered mental status, unspecified altered mental status type [R41.82] DISCHARGE DIAGNOSIS:  Principal Problem:   UTI (urinary tract infection) Active Problems:   Hypothyroidism   HLD (hyperlipidemia)   Essential hypertension   Acute encephalopathy   Pressure injury of skin  SECONDARY DIAGNOSIS:   Past Medical History:  Diagnosis Date  . Chronic pain   . Depression   . HLD (hyperlipidemia)   . HTN (hypertension)   . Hypothyroidism   . Morbid obesity (HCC)   . Recurrent UTI   . Spinal stenosis   . Visual hallucinations    HOSPITAL COURSE:   UTI (urinary tract infection)  She was treated with IV Rocephin but urine culture showed yeast infection. Changed to Diflucan 200 mg daily for 14 days.  Acute encephalopathy - secondary to her UTI, treatment as above. Improved.  Essential hypertension - continue home meds Hypothyroidism - home medications HLD (hyperlipidemia) - continue home meds  Hypokalemia. Given potassium supplement. Magnesium is normal.  DISCHARGE CONDITIONS:  Stable, discharged to home today. CONSULTS OBTAINED:   DRUG ALLERGIES:   Allergies  Allergen Reactions  . Ambien [Zolpidem Tartrate] Other (See Comments)    Reaction: altered mental status  . Amoxicillin Other (See Comments)    Reaction: blisters in mouth Has patient had a PCN reaction causing immediate rash, facial/tongue/throat swelling, SOB or lightheadedness with hypotension: Yes Has patient had a PCN reaction causing severe rash involving mucus membranes or skin necrosis: No Has patient had a PCN  reaction that required hospitalization No Has patient had a PCN reaction occurring within the last 10 years: Yes If all of the above answers are "NO", then may proceed with Cephalosporin use.    DISCHARGE MEDICATIONS:   Allergies as of 12/20/2016      Reactions   Ambien [zolpidem Tartrate] Other (See Comments)   Reaction: altered mental status   Amoxicillin Other (See Comments)   Reaction: blisters in mouth Has patient had a PCN reaction causing immediate rash, facial/tongue/throat swelling, SOB or lightheadedness with hypotension: Yes Has patient had a PCN reaction causing severe rash involving mucus membranes or skin necrosis: No Has patient had a PCN reaction that required hospitalization No Has patient had a PCN reaction occurring within the last 10 years: Yes If all of the above answers are "NO", then may proceed with Cephalosporin use.      Medication List    TAKE these medications   acetaminophen 325 MG tablet Commonly known as:  TYLENOL Take 650 mg by mouth every 6 (six) hours as needed for mild pain.   albuterol 108 (90 Base) MCG/ACT inhaler Commonly known as:  PROVENTIL HFA;VENTOLIN HFA Inhale 2 puffs into the lungs every 4 (four) hours as needed for wheezing or shortness of breath.   albuterol (2.5 MG/3ML) 0.083% nebulizer solution Commonly known as:  PROVENTIL Take 2.5 mg by nebulization every 6 (six) hours as needed for wheezing or shortness of breath.   amlodipine-atorvastatin 2.5-10 MG tablet Commonly known as:  CADUET Take 1 tablet by mouth daily.   ARIPiprazole 2 MG tablet Commonly known as:  ABILIFY Take 2 mg by mouth  daily.   aspirin EC 81 MG tablet Take 81 mg by mouth daily.   baclofen 20 MG tablet Commonly known as:  LIORESAL Take 5-20 mg by mouth See admin instructions. tk  qam,  @ 1400, and  qpm   docusate sodium 100 MG capsule Commonly known as:  COLACE Take 100 mg by mouth 2 (two) times daily.   fluconazole 200 MG  tablet Commonly known as:  DIFLUCAN Take 1 tablet (200 mg total) by mouth daily. What changed:  medication strength  how much to take  when to take this   fluticasone 50 MCG/ACT nasal spray Commonly known as:  FLONASE Place 2 sprays into both nostrils daily as needed (for congestion).   furosemide 20 MG tablet Commonly known as:  LASIX Take 20 mg by mouth daily.   hydrocortisone 2.5 % rectal cream Commonly known as:  ANUSOL-HC Place 1 application rectally at bedtime as needed for hemorrhoids or itching.   levothyroxine 150 MCG tablet Commonly known as:  SYNTHROID, LEVOTHROID Take 150 mcg by mouth daily before breakfast.   mupirocin ointment 2 % Commonly known as:  BACTROBAN Place 1 application into the nose 2 (two) times daily. Apply to open skin on toes   nystatin cream Commonly known as:  MYCOSTATIN Apply 1 application topically 2 (two) times daily. Apply to rash in skin folds   penciclovir 1 % cream Commonly known as:  DENAVIR Apply 1 application topically as directed. Apply to lip lesion 5 times daily, as needed.   polyethylene glycol packet Commonly known as:  MIRALAX / GLYCOLAX Take 17 g by mouth daily.   potassium chloride SA 20 MEQ tablet Commonly known as:  K-DUR,KLOR-CON Take 30 mEq by mouth daily.   pregabalin 150 MG capsule Commonly known as:  LYRICA Take 150 mg by mouth 3 (three) times daily.   senna-docusate 8.6-50 MG tablet Commonly known as:  Senokot-S Take 2 tablets by mouth daily.   Simethicone 180 MG Caps Take 2 capsules by mouth 3 (three) times daily.   traZODone 100 MG tablet Commonly known as:  DESYREL Take 50 mg by mouth at bedtime.   triamcinolone ointment 0.1 % Commonly known as:  KENALOG Apply 1 application topically 2 (two) times daily.   Venlafaxine HCl 225 MG Tb24 Take 225 mg by mouth daily.   vitamin B-12 1000 MCG tablet Commonly known as:  CYANOCOBALAMIN Take 1,000 mcg by mouth daily.   Vitamin D (Ergocalciferol)  50000 units Caps capsule Commonly known as:  DRISDOL Take 50,000 Units by mouth every 30 (thirty) days.   Zinc Oxide 13 % Crea Apply 1 application topically as needed for irritation.            Discharge Care Instructions        Start     Ordered   12/20/16 0000  Increase activity slowly     12/20/16 0950   12/20/16 0000  Diet - low sodium heart healthy     12/20/16 0950   12/20/16 0000  fluconazole (DIFLUCAN) 200 MG tablet  Daily     12/20/16 1133       DISCHARGE INSTRUCTIONS:  See AVS.  If you experience worsening of your admission symptoms, develop shortness of breath, life threatening emergency, suicidal or homicidal thoughts you must seek medical attention immediately by calling 911 or calling your MD immediately  if symptoms less severe.  You Must read complete instructions/literature along with all the possible adverse reactions/side effects for all the Medicines you  take and that have been prescribed to you. Take any new Medicines after you have completely understood and accpet all the possible adverse reactions/side effects.   Please note  You were cared for by a hospitalist during your hospital stay. If you have any questions about your discharge medications or the care you received while you were in the hospital after you are discharged, you can call the unit and asked to speak with the hospitalist on call if the hospitalist that took care of you is not available. Once you are discharged, your primary care physician will handle any further medical issues. Please note that NO REFILLS for any discharge medications will be authorized once you are discharged, as it is imperative that you return to your primary care physician (or establish a relationship with a primary care physician if you do not have one) for your aftercare needs so that they can reassess your need for medications and monitor your lab values.    On the day of Discharge:  VITAL SIGNS:  Blood pressure  132/65, pulse 89, temperature 98 F (36.7 C), temperature source Oral, resp. rate 18, height  (1.778 m), weight 205 lb (93 kg), SpO2 100 %. PHYSICAL EXAMINATION:  GENERAL:  71 y.o.-year-old patient lying in the bed with no acute distress.  EYES: Pupils equal, round, reactive to light and accommodation. No scleral icterus. Extraocular muscles intact.  HEENT: Head atraumatic, normocephalic. Oropharynx and nasopharynx clear.  NECK:  Supple, no jugular venous distention. No thyroid enlargement, no tenderness.  LUNGS: Normal breath sounds bilaterally, no wheezing, rales,rhonchi or crepitation. No use of accessory muscles of respiration.  CARDIOVASCULAR: S1, S2 normal. No murmurs, rubs, or gallops.  ABDOMEN: Soft, non-tender, non-distended. Bowel sounds present. No organomegaly or mass.  EXTREMITIES: No pedal edema, cyanosis, or clubbing.  NEUROLOGIC: Cranial nerves II through XII are intact. Muscle strength 0/5 in all extremities. Sensation intact. Gait not checked.  PSYCHIATRIC: The patient is alert and oriented x 3.  SKIN: No obvious rash, lesion, or ulcer.  DATA REVIEW:   CBC  Recent Labs Lab 12/19/16 0416  WBC 10.3  HGB 12.2  HCT 35.9  PLT 309    Chemistries   Recent Labs Lab 12/18/16 2232 12/19/16 0416  NA 136 137  K 3.4* 3.4*  CL 97* 98*  CO2 30 31  GLUCOSE 113* 101*  BUN 12 11  CREATININE 0.60 0.57  CALCIUM 9.1 9.0  MG  --  2.0  AST 20  --   ALT 15  --   ALKPHOS 161*  --   BILITOT 0.7  --      Microbiology Results  Results for orders placed or performed during the hospital encounter of 12/18/16  Urine culture     Status: Abnormal   Collection Time: 12/18/16 10:32 PM  Result Value Ref Range Status   Specimen Description URINE, CATHETERIZED  Final   Special Requests NONE  Final   Culture >=100,000 COLONIES/mL YEAST (A)  Final   Report Status 12/20/2016 FINAL  Final    RADIOLOGY:  No results found.   Management plans discussed with the patient, her  son and they are in agreement.  CODE STATUS: Full Code   TOTAL TIME TAKING CARE OF THIS PATIENT: 23 minutes.    Shaune Pollack M.D on 12/20/2016 at 3:52 PM  Between 7am to 6pm - Pager - [phone]  After 6pm go to [email or web address] - Therapist, [facility] Hospital[facility]  Office  [phone]  CC:  Primary care physician; [facility]   Note: This dictation was prepared with Nurse, children'sDragon dictation along with smaller phrase technology. Any transcriptional errors that result from this process are unintentional.

## 2016-12-20 NOTE — Care Management Obs Status (Signed)
MEDICARE OBSERVATION STATUS NOTIFICATION   Patient Details  Name: Jessica [name] MRN: [id number] Date of Birth: 07/19/1945   Medicare Observation Status Notification Given:  Yes    Bilan Tedesco A, RN 12/20/2016, 11:25 AM

## 2016-12-20 NOTE — Progress Notes (Signed)
Patient discharging home by EMS. Son at bedside. Instructions given to patient and son, verbalized understanding. Continue to monitor.

## 2017-01-01 ENCOUNTER — Emergency Department: Payer: Medicare Other

## 2017-01-01 ENCOUNTER — Inpatient Hospital Stay
Admission: EM | Admit: 2017-01-01 | Discharge: 2017-01-03 | DRG: 871 | Disposition: A | Discharge status: Home / Self Care | Payer: Medicare Other | Attending: Specialist | Admitting: Specialist

## 2017-01-01 ENCOUNTER — Encounter: Payer: Self-pay | Admitting: Emergency Medicine

## 2017-01-01 DIAGNOSIS — E871 Hypo-osmolality and hyponatremia: Secondary | ICD-10-CM | POA: Diagnosis present

## 2017-01-01 DIAGNOSIS — Z880 Allergy status to penicillin: Secondary | ICD-10-CM

## 2017-01-01 DIAGNOSIS — E039 Hypothyroidism, unspecified: Secondary | ICD-10-CM | POA: Diagnosis present

## 2017-01-01 DIAGNOSIS — E785 Hyperlipidemia, unspecified: Secondary | ICD-10-CM | POA: Diagnosis present

## 2017-01-01 DIAGNOSIS — I10 Essential (primary) hypertension: Secondary | ICD-10-CM | POA: Diagnosis present

## 2017-01-01 DIAGNOSIS — J189 Pneumonia, unspecified organism: Secondary | ICD-10-CM | POA: Diagnosis present

## 2017-01-01 DIAGNOSIS — G9341 Metabolic encephalopathy: Secondary | ICD-10-CM | POA: Diagnosis present

## 2017-01-01 DIAGNOSIS — Z888 Allergy status to other drugs, medicaments and biological substances status: Secondary | ICD-10-CM

## 2017-01-01 DIAGNOSIS — F329 Major depressive disorder, single episode, unspecified: Secondary | ICD-10-CM | POA: Diagnosis present

## 2017-01-01 DIAGNOSIS — N390 Urinary tract infection, site not specified: Secondary | ICD-10-CM | POA: Diagnosis present

## 2017-01-01 DIAGNOSIS — G629 Polyneuropathy, unspecified: Secondary | ICD-10-CM | POA: Diagnosis present

## 2017-01-01 DIAGNOSIS — G8929 Other chronic pain: Secondary | ICD-10-CM | POA: Diagnosis present

## 2017-01-01 DIAGNOSIS — Z7982 Long term (current) use of aspirin: Secondary | ICD-10-CM

## 2017-01-01 DIAGNOSIS — M4800 Spinal stenosis, site unspecified: Secondary | ICD-10-CM | POA: Diagnosis present

## 2017-01-01 DIAGNOSIS — A419 Sepsis, unspecified organism: Principal | ICD-10-CM | POA: Diagnosis present

## 2017-01-01 DIAGNOSIS — Z79899 Other long term (current) drug therapy: Secondary | ICD-10-CM | POA: Diagnosis not present

## 2017-01-01 DIAGNOSIS — Z720 Tobacco use: Secondary | ICD-10-CM

## 2017-01-01 DIAGNOSIS — R4182 Altered mental status, unspecified: Secondary | ICD-10-CM

## 2017-01-01 LAB — CBC
HCT: 44.8 % (ref 35.0–47.0)
Hemoglobin: 14.6 g/dL (ref 12.0–16.0)
MCH: 26 pg (ref 26.0–34.0)
MCHC: 32.7 g/dL (ref 32.0–36.0)
MCV: 79.5 fL — ABNORMAL LOW (ref 80.0–100.0)
Platelets: 324 10*3/uL (ref 150–440)
RBC: 5.63 MIL/uL — ABNORMAL HIGH (ref 3.80–5.20)
RDW: 15.6 % — ABNORMAL HIGH (ref 11.5–14.5)
WBC: 26.3 10*3/uL — ABNORMAL HIGH (ref 3.6–11.0)

## 2017-01-01 LAB — COMPREHENSIVE METABOLIC PANEL
ALT: 25 U/L (ref 14–54)
AST: 35 U/L (ref 15–41)
Albumin: 2.7 g/dL — ABNORMAL LOW (ref 3.5–5.0)
Alkaline Phosphatase: 142 U/L — ABNORMAL HIGH (ref 38–126)
Anion gap: 13 (ref 5–15)
BUN: 16 mg/dL (ref 6–20)
CO2: 25 mmol/L (ref 22–32)
Calcium: 9.3 mg/dL (ref 8.9–10.3)
Chloride: 95 mmol/L — ABNORMAL LOW (ref 101–111)
Creatinine, Ser: 0.75 mg/dL (ref 0.44–1.00)
GFR calc Af Amer: >60 mL/min (ref >60)
GFR calc non Af Amer: >60 mL/min (ref >60)
Glucose, Bld: 134 mg/dL — ABNORMAL HIGH (ref 65–99)
Potassium: 4.7 mmol/L (ref 3.5–5.1)
Sodium: 133 mmol/L — ABNORMAL LOW (ref 135–145)
Total Bilirubin: 0.7 mg/dL (ref 0.3–1.2)
Total Protein: 7.2 g/dL (ref 6.5–8.1)

## 2017-01-01 LAB — LACTIC ACID, PLASMA
Lactic Acid, Venous: 2.1 mmol/L (ref 0.5–1.9)
Lactic Acid, Venous: 2.4 mmol/L (ref 0.5–1.9)

## 2017-01-01 LAB — URINALYSIS, COMPLETE (UACMP) WITH MICROSCOPIC
Bacteria, UA: NONE SEEN
Bilirubin Urine: NEGATIVE
Glucose, UA: NEGATIVE mg/dL
Ketones, ur: NEGATIVE mg/dL
Nitrite: NEGATIVE
Protein, ur: 30 mg/dL — AB
Specific Gravity, Urine: 1.012 (ref 1.005–1.030)
pH: 5 (ref 5.0–8.0)

## 2017-01-01 LAB — PROCALCITONIN: Procalcitonin: 0.89 ng/mL

## 2017-01-01 LAB — PROTIME-INR
INR: 1.16
Prothrombin Time: 14.7 seconds (ref 11.4–15.2)

## 2017-01-01 LAB — MRSA PCR SCREENING: MRSA by PCR: NEGATIVE

## 2017-01-01 LAB — APTT: aPTT: 39 seconds — ABNORMAL HIGH (ref 24–36)

## 2017-01-01 MED ORDER — FLUTICASONE PROPIONATE 50 MCG/ACT NA SUSP
2.0000 | Freq: Every day | NASAL | Status: DC | PRN
  Filled 2017-01-01: qty 16

## 2017-01-01 MED ORDER — ACETAMINOPHEN 325 MG PO TABS: 650.0000 mg | ORAL_TABLET | Freq: Four times a day (QID) | ORAL | Status: DC | PRN

## 2017-01-01 MED ORDER — ONDANSETRON HCL 4 MG PO TABS: 4.0000 mg | ORAL_TABLET | Freq: Four times a day (QID) | ORAL | Status: DC | PRN

## 2017-01-01 MED ORDER — FLUCONAZOLE 100 MG PO TABS
200.0000 mg | ORAL_TABLET | Freq: Every day | ORAL | Status: DC
  Administered 2017-01-02 – 2017-01-03 (×2): 200 mg via ORAL
  Filled 2017-01-01 (×2): qty 2

## 2017-01-01 MED ORDER — SODIUM CHLORIDE 0.9 % IV BOLUS (SEPSIS)
1000.0000 mL | Freq: Once | INTRAVENOUS | Status: AC
  Administered 2017-01-01: 1000 mL via INTRAVENOUS

## 2017-01-01 MED ORDER — FLUCONAZOLE 100 MG PO TABS
200.0000 mg | ORAL_TABLET | Freq: Once | ORAL | Status: AC
  Administered 2017-01-01: 200 mg via ORAL
  Filled 2017-01-01: qty 2

## 2017-01-01 MED ORDER — ENOXAPARIN SODIUM 40 MG/0.4ML ~~LOC~~ SOLN
40.0000 mg | SUBCUTANEOUS | Status: DC
  Administered 2017-01-01 – 2017-01-02 (×2): 40 mg via SUBCUTANEOUS
  Filled 2017-01-01 (×2): qty 0.4

## 2017-01-01 MED ORDER — SODIUM CHLORIDE 0.9 % IV SOLN
INTRAVENOUS | Status: DC
  Administered 2017-01-01: 21:00:00 via INTRAVENOUS

## 2017-01-01 MED ORDER — SIMETHICONE 80 MG PO CHEW
160.0000 mg | CHEWABLE_TABLET | Freq: Three times a day (TID) | ORAL | Status: DC
  Administered 2017-01-01 – 2017-01-03 (×5): 160 mg via ORAL
  Filled 2017-01-01 (×7): qty 2

## 2017-01-01 MED ORDER — VENLAFAXINE HCL ER 75 MG PO CP24
225.0000 mg | ORAL_CAPSULE | Freq: Every day | ORAL | Status: DC
  Administered 2017-01-02 – 2017-01-03 (×2): 225 mg via ORAL
  Filled 2017-01-01 (×3): qty 3

## 2017-01-01 MED ORDER — HYDROCODONE-ACETAMINOPHEN 5-325 MG PO TABS: 1.0000 | ORAL_TABLET | ORAL | Status: DC | PRN

## 2017-01-01 MED ORDER — GUAIFENESIN 100 MG/5ML PO SOLN
5.0000 mL | ORAL | Status: DC | PRN
  Filled 2017-01-01: qty 5

## 2017-01-01 MED ORDER — POLYETHYLENE GLYCOL 3350 17 G PO PACK
17.0000 g | PACK | Freq: Every day | ORAL | Status: DC
  Administered 2017-01-02: 17 g via ORAL
  Filled 2017-01-01 (×2): qty 1

## 2017-01-01 MED ORDER — VANCOMYCIN HCL 10 G IV SOLR
1500.0000 mg | Freq: Two times a day (BID) | INTRAVENOUS | Status: DC
  Filled 2017-01-01 (×2): qty 1500

## 2017-01-01 MED ORDER — ACETAMINOPHEN 650 MG RE SUPP: 650.0000 mg | Freq: Four times a day (QID) | RECTAL | Status: DC | PRN

## 2017-01-01 MED ORDER — VANCOMYCIN HCL IN DEXTROSE 1-5 GM/200ML-% IV SOLN
1000.0000 mg | Freq: Once | INTRAVENOUS | Status: AC
  Administered 2017-01-01: 1000 mg via INTRAVENOUS
  Filled 2017-01-01: qty 200

## 2017-01-01 MED ORDER — ALBUTEROL SULFATE (2.5 MG/3ML) 0.083% IN NEBU: 2.5000 mg | INHALATION_SOLUTION | RESPIRATORY_TRACT | Status: DC | PRN

## 2017-01-01 MED ORDER — SENNOSIDES-DOCUSATE SODIUM 8.6-50 MG PO TABS
1.0000 | ORAL_TABLET | Freq: Every evening | ORAL | Status: DC | PRN
  Administered 2017-01-01: 1 via ORAL
  Filled 2017-01-01: qty 1

## 2017-01-01 MED ORDER — VANCOMYCIN HCL 10 G IV SOLR
1250.0000 mg | Freq: Two times a day (BID) | INTRAVENOUS | Status: DC
  Administered 2017-01-01: 1250 mg via INTRAVENOUS
  Filled 2017-01-01 (×4): qty 1250

## 2017-01-01 MED ORDER — ASPIRIN EC 81 MG PO TBEC
81.0000 mg | DELAYED_RELEASE_TABLET | Freq: Every day | ORAL | Status: DC
  Administered 2017-01-02 – 2017-01-03 (×2): 81 mg via ORAL
  Filled 2017-01-01 (×2): qty 1

## 2017-01-01 MED ORDER — LEVOTHYROXINE SODIUM 50 MCG PO TABS
150.0000 ug | ORAL_TABLET | Freq: Every day | ORAL | Status: DC
  Administered 2017-01-02 – 2017-01-03 (×2): 150 ug via ORAL
  Filled 2017-01-01 (×2): qty 3

## 2017-01-01 MED ORDER — VITAMIN B-12 1000 MCG PO TABS
1000.0000 ug | ORAL_TABLET | Freq: Every day | ORAL | Status: DC
  Administered 2017-01-02 – 2017-01-03 (×2): 1000 ug via ORAL
  Filled 2017-01-01 (×2): qty 1

## 2017-01-01 MED ORDER — BISACODYL 5 MG PO TBEC: 5.0000 mg | DELAYED_RELEASE_TABLET | Freq: Every day | ORAL | Status: DC | PRN

## 2017-01-01 MED ORDER — SENNOSIDES-DOCUSATE SODIUM 8.6-50 MG PO TABS
2.0000 | ORAL_TABLET | Freq: Every day | ORAL | Status: DC
  Administered 2017-01-02 – 2017-01-03 (×2): 2 via ORAL
  Filled 2017-01-01 (×2): qty 2

## 2017-01-01 MED ORDER — PREGABALIN 75 MG PO CAPS
150.0000 mg | ORAL_CAPSULE | Freq: Three times a day (TID) | ORAL | Status: DC
  Administered 2017-01-01 – 2017-01-03 (×5): 150 mg via ORAL
  Filled 2017-01-01 (×5): qty 2

## 2017-01-01 MED ORDER — DEXTROSE 5 % IV SOLN
2.0000 g | Freq: Two times a day (BID) | INTRAVENOUS | Status: DC
  Administered 2017-01-01 – 2017-01-03 (×4): 2 g via INTRAVENOUS
  Filled 2017-01-01 (×5): qty 2

## 2017-01-01 MED ORDER — TRAZODONE HCL 50 MG PO TABS
50.0000 mg | ORAL_TABLET | Freq: Every day | ORAL | Status: DC
  Administered 2017-01-01 – 2017-01-02 (×2): 50 mg via ORAL
  Filled 2017-01-01 (×2): qty 1

## 2017-01-01 MED ORDER — CEFTRIAXONE SODIUM IN DEXTROSE 20 MG/ML IV SOLN
1.0000 g | Freq: Once | INTRAVENOUS | Status: AC
  Administered 2017-01-01: 1 g via INTRAVENOUS
  Filled 2017-01-01: qty 50

## 2017-01-01 MED ORDER — ARIPIPRAZOLE 2 MG PO TABS
2.0000 mg | ORAL_TABLET | Freq: Every day | ORAL | Status: DC
  Administered 2017-01-02 – 2017-01-03 (×2): 2 mg via ORAL
  Filled 2017-01-01 (×2): qty 1

## 2017-01-01 MED ORDER — DOCUSATE SODIUM 100 MG PO CAPS
100.0000 mg | ORAL_CAPSULE | Freq: Two times a day (BID) | ORAL | Status: DC
  Administered 2017-01-01 – 2017-01-03 (×4): 100 mg via ORAL
  Filled 2017-01-01 (×5): qty 1

## 2017-01-01 MED ORDER — ONDANSETRON HCL 4 MG/2ML IJ SOLN: 4.0000 mg | Freq: Four times a day (QID) | INTRAMUSCULAR | Status: DC | PRN

## 2017-01-01 NOTE — ED Notes (Signed)
Attempted to call report x 1  

## 2017-01-01 NOTE — ED Notes (Signed)
Soiled brief and linen changed at this time. 

## 2017-01-01 NOTE — Progress Notes (Signed)
Pharmacy Antibiotic Note  Jessica [name] is a 71 y.o. female admitted on 01/01/2017 with HCAP.  Pharmacy has been consulted for cefepime dosing.  Plan: Cefepime 2g IV q12h   Height:  (177.8 cm) Weight: 205 lb (93 kg) IBW/kg (Calculated) : 68.5  Temp (24hrs), Avg:98.2 F (36.8 C), Min:98.2 F (36.8 C), Max:98.2 F (36.8 C)   Recent Labs Lab 01/01/17 1414 01/01/17 1544 01/01/17 1900  WBC 26.3*  --   --   CREATININE 0.75  --   --   LATICACIDVEN  --  2.1* 2.4*    Estimated Creatinine Clearance: 82 mL/min (by C-G formula based on SCr of 0.75 mg/dL).    Allergies  Allergen Reactions  . Ambien [Zolpidem Tartrate] Other (See Comments)    Reaction: altered mental status  . Amoxicillin Other (See Comments)    Reaction: blisters in mouth Has patient had a PCN reaction causing immediate rash, facial/tongue/throat swelling, SOB or lightheadedness with hypotension: Yes Has patient had a PCN reaction causing severe rash involving mucus membranes or skin necrosis: No Has patient had a PCN reaction that required hospitalization No Has patient had a PCN reaction occurring within the last 10 years: Yes If all of the above answers are "NO", then may proceed with Cephalosporin use.     Antimicrobials this admission: 9/21 Ceftriaxone 9/21 x 1 9/21 Vancomycin>> 9/21 Cefepime>>   Microbiology results: 9/21 BCx: pending 9/21 UCx: pending  9/21 MRSA PCR: pending   Thank you for allowing pharmacy to be a part of this patient's care.  Jessica [name]  Pharmacy Resident  01/01/2017 8:42 PM

## 2017-01-01 NOTE — H&P (Addendum)
[employer] - [place] at [facility]   [name]    MR#:  [id number]  DATE OF BIRTH:  [birth year]  DATE OF ADMISSION:  01/01/2017  PRIMARY CARE PHYSICIAN: [facility]   REQUESTING/REFERRING PHYSICIAN: Jeanmarie Plant, MD  CHIEF COMPLAINT:   Chief Complaint  Patient presents with  . Altered Mental Status   Confusion and hallucination today. HISTORY OF PRESENT ILLNESS:  Judea Fennimore  is a 71 y.o. female with a known history of Recurrent UTI, chronic pain, hypertension, spinal stenosis and hypothyroidism. The patient was just discharged from the hospital 2 weeks ago after treatment of recurrent UTI. Urine culture showed yeast. She has been treated with Diflucan after discharge. She has had cough and the sputum for couple of days. Chest x-ray show pneumonia and the urinalysis show UTI. She is found tachycardia, tachypnea and leukocytosis. She is being treated with antibiotics for sepsis.  PAST MEDICAL HISTORY:   Past Medical History:  Diagnosis Date  . Chronic pain   . Depression   . HLD (hyperlipidemia)   . HTN (hypertension)   . Hypothyroidism   . Morbid obesity (HCC)   . Recurrent UTI   . Spinal stenosis   . Visual hallucinations     PAST SURGICAL HISTORY:   Past Surgical History:  Procedure Laterality Date  . ABDOMINAL HYSTERECTOMY    . BACK SURGERY    . HEMORROIDECTOMY      SOCIAL HISTORY:   Social History  Substance Use Topics  . Smoking status: Never Smoker  . Smokeless tobacco: Current User    Types: Snuff  . Alcohol use No    FAMILY HISTORY:   Family History  Problem Relation Age of Onset  . Heart disease Father   . Deep vein thrombosis Father     DRUG ALLERGIES:   Allergies  Allergen Reactions  . Ambien [Zolpidem Tartrate] Other (See Comments)    Reaction: altered mental status  . Amoxicillin Other (See Comments)    Reaction: blisters in mouth Has patient had a PCN  reaction causing immediate rash, facial/tongue/throat swelling, SOB or lightheadedness with hypotension: Yes Has patient had a PCN reaction causing severe rash involving mucus membranes or skin necrosis: No Has patient had a PCN reaction that required hospitalization No Has patient had a PCN reaction occurring within the last 10 years: Yes If all of the above answers are "NO", then may proceed with Cephalosporin use.     REVIEW OF SYSTEMS:   Review of Systems  Constitutional: Positive for chills, fever and malaise/fatigue.  HENT: Negative for sore throat.   Eyes: Negative for blurred vision and double vision.  Respiratory: Positive for cough, sputum production and shortness of breath. Negative for hemoptysis and stridor.   Cardiovascular: Negative for chest pain, palpitations, orthopnea and leg swelling.  Gastrointestinal: Negative for abdominal pain, blood in stool, diarrhea, melena, nausea and vomiting.  Genitourinary: Negative for dysuria, flank pain and hematuria.  Musculoskeletal: Negative for back pain and joint pain.  Skin: Negative for rash.  Neurological: Positive for weakness. Negative for dizziness, sensory change, focal weakness, seizures, loss of consciousness and headaches.  Endo/Heme/Allergies: Negative for polydipsia.  Psychiatric/Behavioral: Negative for depression. The patient is not nervous/anxious.     MEDICATIONS AT HOME:   Prior to Admission medications   Medication Sig Start Date End Date Taking? Authorizing Provider  acetaminophen (TYLENOL) 325 MG tablet Take 650 mg by mouth every 6 (six) hours as needed for mild  pain.   Yes [provider]  albuterol (PROVENTIL HFA;VENTOLIN HFA) 108 (90 Base) MCG/ACT inhaler Inhale 2 puffs into the lungs every 4 (four) hours as needed for wheezing or shortness of breath.   Yes [provider]  albuterol (PROVENTIL) (2.5 MG/3ML) 0.083% nebulizer solution Take 2.5 mg by nebulization every 6 (six) hours as needed  for wheezing or shortness of breath.   Yes [provider]  amlodipine-atorvastatin (CADUET) 2.5-10 MG tablet Take 1 tablet by mouth daily.   Yes [provider]  ARIPiprazole (ABILIFY) 2 MG tablet Take 2 mg by mouth daily. 08/27/16  Yes [provider]  aspirin EC 81 MG tablet Take 81 mg by mouth daily.   Yes [provider]  baclofen (LIORESAL) 20 MG tablet Take 5-20 mg by mouth 3 (three) times daily. 20 mg every morning, 5 mg at lunch, and 20 mg every evening   Yes [provider]  docusate sodium (COLACE) 100 MG capsule Take 100 mg by mouth 2 (two) times daily.   Yes [provider]  fluconazole (DIFLUCAN) 200 MG tablet Take 1 tablet (200 mg total) by mouth daily. 12/20/16  Yes Shaune Pollack, MD  fluticasone [facility]) 50 MCG/ACT nasal spray Place 2 sprays into both nostrils daily as needed. For congestion   Yes [provider]  furosemide (LASIX) 20 MG tablet Take 20 mg by mouth daily.   Yes [provider]  hydrocortisone (ANUSOL-HC) 2.5 % rectal cream Place 1 application rectally at bedtime as needed for hemorrhoids or itching.   Yes [provider]  levothyroxine (SYNTHROID, LEVOTHROID) 150 MCG tablet Take 150 mcg by mouth daily before breakfast.   Yes [provider]  mupirocin ointment (BACTROBAN) 2 % Place 1 application into the nose 2 (two) times daily. Apply to open skin on toes   Yes [provider]  nystatin cream (MYCOSTATIN) Apply 1 application topically 2 (two) times daily. Apply to rash in skin folds   Yes [provider]  penciclovir (DENAVIR) 1 % cream Apply 1 application topically 5 (five) times daily as needed. Apply to lip for lesions   Yes [provider]  polyethylene glycol (MIRALAX / GLYCOLAX) packet Take 17 g by mouth daily.   Yes [provider]  potassium chloride SA (K-DUR,KLOR-CON) 20 MEQ tablet Take 30 mEq by mouth daily.    Yes [provider]  pregabalin (LYRICA) 150 MG capsule Take 150 mg by mouth 3 (three) times daily.   Yes [provider]  senna-docusate (SENOKOT-S) 8.6-50 MG tablet Take 2 tablets by mouth daily.   Yes [provider]  Simethicone 180 MG CAPS Take 2 capsules by mouth 3 (three) times daily.   Yes [provider]  traZODone (DESYREL) 100 MG tablet Take 50 mg by mouth at bedtime.    Yes [provider]  triamcinolone ointment (KENALOG) 0.1 % Apply 1 application topically 2 (two) times daily.   Yes [provider]  Venlafaxine HCl 225 MG TB24 Take 225 mg by mouth daily.   Yes [provider]  vitamin B-12 (CYANOCOBALAMIN) 1000 MCG tablet Take 1,000 mcg by mouth daily.   Yes [provider]  Vitamin D, Ergocalciferol, (DRISDOL) 50000 units CAPS capsule Take 50,000 Units by mouth every 30 (thirty) days.   Yes [provider]  Zinc Oxide 13 % CREA Apply 1 application topically as needed for irritation.   Yes [provider]      VITAL SIGNS:  Blood pressure (!) 107/57, pulse 96, temperature 98.2 F (36.8 C), temperature source Oral, resp. rate (!) 21, height  (1.778 m), weight 205 lb (93 kg), SpO2 95 %.  PHYSICAL EXAMINATION:  Physical Exam  GENERAL:  71 y.o.-year-old patient lying in the bed with no acute distress. Morbidly obese. EYES: Pupils equal, round, reactive to light and accommodation. No scleral icterus. Extraocular muscles intact.  HEENT: Head atraumatic, normocephalic. Oropharynx and nasopharynx clear.  NECK:  Supple, no jugular venous distention. No thyroid enlargement, no tenderness.  LUNGS: Normal breath sounds bilaterally, but has crackles. No use of accessory muscles of respiration.  CARDIOVASCULAR: S1, S2 normal. No murmurs, rubs, or gallops.  ABDOMEN: Soft, nontender, nondistended. Bowel sounds present. No organomegaly or mass.  EXTREMITIES: No pedal edema, cyanosis, or clubbing. Bilateral lower extremity  paralyzed. NEUROLOGIC: Cranial nerves II through XII are intact.  PSYCHIATRIC: The patient is alert and oriented x 3.  SKIN: No obvious rash, lesion, or ulcer.   LABORATORY PANEL:   CBC  Recent Labs Lab 01/01/17 1414  WBC 26.3*  HGB 14.6  HCT 44.8  PLT 324   ------------------------------------------------------------------------------------------------------------------  Chemistries   Recent Labs Lab 01/01/17 1414  NA 133*  K 4.7  CL 95*  CO2 25  GLUCOSE 134*  BUN 16  CREATININE 0.75  CALCIUM 9.3  AST 35  ALT 25  ALKPHOS 142*  BILITOT 0.7   ------------------------------------------------------------------------------------------------------------------  Cardiac Enzymes No results for input(s): TROPONINI in the last 168 hours. ------------------------------------------------------------------------------------------------------------------  RADIOLOGY:  Dg Chest 2 View  Result Date: 01/01/2017 CLINICAL DATA:  Altered mental status. Recent UTI. History of hypertension and bronchitis. EXAM: CHEST  2 VIEW COMPARISON:  Chest x-rays dated 08/24/2016 and 08/14/2016. FINDINGS: Stable cardiomegaly. Lungs are hyperexpanded. Opacity at the left lung base. Probable small left pleural effusion. No acute or suspicious osseous finding. IMPRESSION: 1. Airspace opacity at the left lung base, suspicious for pneumonia, alternatively atelectasis or asymmetric edema. 2. Probable small left pleural effusion. 3. Stable cardiomegaly.  No evidence of CHF. 4. Hyperexpanded lungs suggesting COPD. Electronically Signed   By: Bary Richard M.D.   On: 01/01/2017 16:27      IMPRESSION AND PLAN:   Sepsis due to pneumonia and UTI. The patient will be admitted to medical floor. Start sepsis protocol, cefepime and vancomycin IV, follow-up CBC, urine culture and blood culture.  Lactic acidosis. Due to a fall. Follow-up lactic acid level. Hyponatremia. Start normal saline IV and follow-up  BMP. Hypertension. Hold Norvasc and Lasix due to low side blood pressure.   All the records are reviewed and case discussed with ED provider. Management plans discussed with the patient, her son and husband and they are in agreement.  CODE STATUS: Full code  TOTAL TIME TAKING CARE OF THIS PATIENT: 56 minutes.    Shaune Pollack M.D on 01/01/2017 at 6:50 PM  Between 7am to 6pm - Pager - [phone]  After 6pm go to [email or web address] - Scientist, research (life sciences) [place] [facility]  Office  [phone]  CC: Primary care physician; [facility]   Note: This dictation was prepared with Nurse, children's dictation along with smaller phrase technology. Any transcriptional errors that result from this process are unin

## 2017-01-01 NOTE — ED Triage Notes (Signed)
Pt to ED via [facility] from home for Altered Mental Status per family. EMS reports that pt was discharged recently for UTI, pt is on day 12/14 of antibiotics. Pt family reported to EMS that they noticed this morning that pt was altered from her baseline. Pt is A & O time 4 at this time. EMS reports that they have transported pt several times recently and pt appears to be at baseline. Pt is bed ridden x 11 years. Pt reports that she has not eaten today because she has not been hungry. CBG was 161 with EMS

## 2017-01-01 NOTE — ED Notes (Signed)
Patient transported to radiology

## 2017-01-01 NOTE — Progress Notes (Addendum)
Pharmacy Antibiotic Note  Jessica [name] is a 71 y.o. female admitted on 01/01/2017 with pneumonia/sepsis.  Pharmacy has been consulted for vancomycin dosing.  Plan: Vancomycin 1g IV x 1 followed by vancomycin  IV Q12hr for goal trough of 15-20. Will obtain trough prior to am dose on 9/23.  Height:  (177.8 cm) Weight: 205 lb (93 kg) IBW/kg (Calculated) : 68.5  Temp (24hrs), Avg:98.2 F (36.8 C), Min:98.2 F (36.8 C), Max:98.2 F (36.8 C)   Recent Labs Lab 01/01/17 1414 01/01/17 1544  WBC 26.3*  --   CREATININE 0.75  --   LATICACIDVEN  --  2.1*    Estimated Creatinine Clearance: 82 mL/min (by C-G formula based on SCr of 0.75 mg/dL).    Allergies  Allergen Reactions  . Ambien [Zolpidem Tartrate] Other (See Comments)    Reaction: altered mental status  . Amoxicillin Other (See Comments)    Reaction: blisters in mouth Has patient had a PCN reaction causing immediate rash, facial/tongue/throat swelling, SOB or lightheadedness with hypotension: Yes Has patient had a PCN reaction causing severe rash involving mucus membranes or skin necrosis: No Has patient had a PCN reaction that required hospitalization No Has patient had a PCN reaction occurring within the last 10 years: Yes If all of the above answers are "NO", then may proceed with Cephalosporin use.     Antimicrobials this admission: Ceftriaxone 9/21 x 1 Vancomycin 9/21 >>   Dose adjustments this admission: N/A  Microbiology results: 9/21 BCx: pending  9/21 UCx: pending  9/21 MRSA PCR: pending   Thank you for allowing pharmacy to be a part of this patient's care.  Simpson,Michael L 01/01/2017 6:48 PM

## 2017-01-01 NOTE — ED Notes (Signed)
In and out cath completed by this RN. Assisted by Jae Dire, RN. Patient tolerated well. Sterile technique maintained. Cloudy amber urine returned.

## 2017-01-01 NOTE — ED Provider Notes (Signed)
[facility] Emergency Department Provider Note  ____________________________________________   I have reviewed the triage vital signs and the nursing notes.   HISTORY  Chief Complaint Altered Mental Status    HPI MALEA SWILLING is a 71 y.o. female  Presents with some degree of confusion. She is actually awake and alert and oriented for me but the family states that she has had some hallucinations which is normal for her when she gets sick. Patient is a lesion taken has not walked in 11 years. She has a catheter. She has no dysuria no urinary frequency. She says she has a slight cough which may have started in the last couple days. She is being treated for a yeast and urinary tract infection with Diflucan and they have been compliant with the medications. Cough is occasionally productive and poorly described. There's been no fever at home no headache no stiff neck no rash no cellulitic changes and no decubitus ulcers     Past Medical History:  Diagnosis Date  . Chronic pain   . Depression   . HLD (hyperlipidemia)   . HTN (hypertension)   . Hypothyroidism   . Morbid obesity (HCC)   . Recurrent UTI   . Spinal stenosis   . Visual hallucinations     Patient Active Problem List   Diagnosis Date Noted  . Pressure injury of skin 12/19/2016  . UTI (urinary tract infection) 12/18/2016  . Tachycardia 09/12/2016  . Acute encephalopathy 08/14/2016  . Hallucinations 07/06/2016  . Hypokalemia 07/06/2016  . Sinus tachycardia 07/06/2016  . RECTAL PAIN 09/04/2009  . CONSTIPATION 07/08/2009  . DEEP VENOUS THROMBOPHLEBITIS, BILATERAL 01/07/2009  . PALLOR 01/07/2009  . DIFFICULTY IN [facility] 11/22/2008  . ABDOMINAL OR PELVIC SWELLING MASS OR LUMP LLQ 10/01/2008  . 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 HLD (hyperlipidemia) 12/10/2006  . ANXIETY 12/10/2006  . DEPRESSION 12/10/2006  . Essential hypertension 12/10/2006  . OSTEOARTHRITIS 12/10/2006  . Hypothyroidism  09/22/2006  . HYPERCHOLESTEROLEMIA 09/22/2006  . ECZEMA 09/22/2006    Past Surgical History:  Procedure Laterality Date  . ABDOMINAL HYSTERECTOMY    . BACK SURGERY    . HEMORROIDECTOMY      Prior to Admission medications   Medication Sig Start Date End Date Taking? Authorizing Provider  acetaminophen (TYLENOL) 325 MG tablet Take 650 mg by mouth every 6 (six) hours as needed for mild pain.    [provider]  albuterol (PROVENTIL HFA;VENTOLIN HFA) 108 (90 Base) MCG/ACT inhaler Inhale 2 puffs into the lungs every 4 (four) hours as needed for wheezing or shortness of breath.    [provider]  albuterol (PROVENTIL) (2.5 MG/3ML) 0.083% nebulizer solution Take 2.5 mg by nebulization every 6 (six) hours as needed for wheezing or shortness of breath.    [provider]  amlodipine-atorvastatin (CADUET) 2.5-10 MG tablet Take 1 tablet by mouth daily.    [provider]  ARIPiprazole (ABILIFY) 2 MG tablet Take 2 mg by mouth daily. 08/27/16   [provider]  aspirin EC 81 MG tablet Take 81 mg by mouth daily.    [provider]  baclofen (LIORESAL) 20 MG tablet Take 5-20 mg by mouth See admin instructions. tk  qam,  @ 1400, and  qpm    [provider]  docusate sodium (COLACE) 100 MG capsule Take 100 mg by mouth 2 (two) times daily.    [provider]  fluconazole (DIFLUCAN) 200 MG tablet Take 1 tablet (200 mg total) by mouth  daily. 12/20/16   Shaune Pollack, MD  fluticasone [facility]) 50 MCG/ACT nasal spray Place 2 sprays into both nostrils daily as needed (for congestion).    [provider]  furosemide (LASIX) 20 MG tablet Take 20 mg by mouth daily.    [provider]  hydrocortisone (ANUSOL-HC) 2.5 % rectal cream Place 1 application rectally at bedtime as needed for hemorrhoids or itching.    [provider]  levothyroxine (SYNTHROID, LEVOTHROID) 150 MCG tablet Take 150 mcg by mouth daily before  breakfast.    [provider]  mupirocin ointment (BACTROBAN) 2 % Place 1 application into the nose 2 (two) times daily. Apply to open skin on toes    [provider]  nystatin cream (MYCOSTATIN) Apply 1 application topically 2 (two) times daily. Apply to rash in skin folds    [provider]  penciclovir (DENAVIR) 1 % cream Apply 1 application topically as directed. Apply to lip lesion 5 times daily, as needed.    [provider]  polyethylene glycol (MIRALAX / GLYCOLAX) packet Take 17 g by mouth daily.    [provider]  potassium chloride SA (K-DUR,KLOR-CON) 20 MEQ tablet Take 30 mEq by mouth daily.     [provider]  pregabalin (LYRICA) 150 MG capsule Take 150 mg by mouth 3 (three) times daily.    [provider]  senna-docusate (SENOKOT-S) 8.6-50 MG tablet Take 2 tablets by mouth daily.    [provider]  Simethicone 180 MG CAPS Take 2 capsules by mouth 3 (three) times daily.    [provider]  traZODone (DESYREL) 100 MG tablet Take 50 mg by mouth at bedtime.     [provider]  triamcinolone ointment (KENALOG) 0.1 % Apply 1 application topically 2 (two) times daily.    [provider]  Venlafaxine HCl 225 MG TB24 Take 225 mg by mouth daily.    [provider]  vitamin B-12 (CYANOCOBALAMIN) 1000 MCG tablet Take 1,000 mcg by mouth daily.    [provider]  Vitamin D, Ergocalciferol, (DRISDOL) 50000 units CAPS capsule Take 50,000 Units by mouth every 30 (thirty) days.    [provider]  Zinc Oxide 13 % CREA Apply 1 application topically as needed for irritation.    [provider]    Allergies Ambien [zolpidem tartrate] and Amoxicillin  Family History  Problem Relation Age of Onset  . Heart disease Father   . Deep vein thrombosis Father     Social History Social History  Substance Use Topics  . Smoking status: Never Smoker  . Smokeless  tobacco: Current User    Types: Snuff  . Alcohol use No    Review of Systems Constitutional: No fever/chills Eyes: No visual changes. ENT: No sore throat. No stiff neck no neck pain Cardiovascular: Denies chest pain. Respiratory: Denies shortness of breath. Gastrointestinal:   no vomiting.  No diarrhea.  No constipation. Genitourinary: Negative for dysuria. Musculoskeletal: Negative lower extremity swelling Skin: Negative for rash. Neurological: Negative for severe headaches, focal weakness or numbness.   ____________________________________________   PHYSICAL EXAM:  VITAL SIGNS: ED Triage Vitals  Enc Vitals Group     BP 01/01/17 1418 106/69     Pulse Rate 01/01/17 1418 97     Resp 01/01/17 1430 18     Temp 01/01/17 1418 98.2 F (36.8 C)     Temp Source 01/01/17 1418 Oral     SpO2 01/01/17 1418 95 %  Weight 01/01/17 1411 205 lb (93 kg)     Height 01/01/17 1411  (1.778 m)     Head Circumference --      Peak Flow --      Pain Score --      Pain Loc --      Pain Edu? --      Excl. in GC? --     Constitutional: Alert and oriented. Well appearing and in no acute distress. Eyes: Conjunctivae are normal Head: Atraumatic HEENT: No congestion/rhinnorhea. Mucous membranes are moist.  Oropharynx non-erythematous Neck:   Nontender with no meningismus, no masses, no stridor Cardiovascular: Normal rate, regular rhythm. Grossly normal heart sounds.  Good peripheral circulation. Respiratory: Normal respiratory effort.  No retractions. Diminished in the bases right greater than left no rales or rhonchi as ports respiratory effort Abdominal: Soft and nontender. No distention. No guarding no rebound Back:  There is no focal tenderness or step off.  there is no midline tenderness there are no lesions noted. there is no CVA tenderness Musculoskeletal: No lower extremity tenderness, no upper extremity tenderness. No joint effusions, no DVT signs strong distal pulses no  edema Neurologic:  Normal speech and language.  Skin:  Skin is warm, dry and intact. No rash noted. Psychiatric: Mood and affect are normal. Speech and behavior are normal.  ____________________________________________   LABS (all labs ordered are listed, but only abnormal results are displayed)  Labs Reviewed  COMPREHENSIVE METABOLIC PANEL - Abnormal; Notable for the following:       Result Value   Sodium 133 (*)    Chloride 95 (*)    Glucose, Bld 134 (*)    Albumin 2.7 (*)    Alkaline Phosphatase 142 (*)    All other components within normal limits  CBC - Abnormal; Notable for the following:    WBC 26.3 (*)    RBC 5.63 (*)    MCV 79.5 (*)    RDW 15.6 (*)    All other components within normal limits  URINALYSIS, COMPLETE (UACMP) WITH MICROSCOPIC - Abnormal; Notable for the following:    Color, Urine AMBER (*)    APPearance TURBID (*)    Hgb urine dipstick SMALL (*)    Protein, ur 30 (*)    Leukocytes, UA MODERATE (*)    Squamous Epithelial / LPF 6-30 (*)    All other components within normal limits  LACTIC ACID, PLASMA - Abnormal; Notable for the following:    Lactic Acid, Venous 2.1 (*)    All other components within normal limits  URINE CULTURE  CULTURE, BLOOD (ROUTINE X 2)  CULTURE, BLOOD (ROUTINE X 2)  LACTIC ACID, PLASMA  CBG MONITORING, ED    Pertinent labs  results that were available during my care of the patient were reviewed by me and considered in my medical decision making (see chart for details). ____________________________________________  EKG  I personally interpreted any EKGs ordered by me or triage Sinus tachycardia, rate 108, right bundle branch block noted, no acute ischemic changes ____________________________________________  RADIOLOGY  Pertinent labs & imaging results that were available during my care of the patient were reviewed by me and considered in my medical decision making (see chart for details). If possible, patient and/or family  made aware of any abnormal findings. ____________________________________________    PROCEDURES  Procedure(s) performed: None  Procedures  Critical Care performed: CRITICAL CARE Performed by: Jeanmarie Plant   Total critical care time: 38 minutes  Critical care time  was exclusive of separately billable procedures and treating other patients.  Critical care was necessary to treat or prevent imminent or life-threatening deterioration.  Critical care was time spent personally by me on the following activities: development of treatment plan with patient and/or surrogate as well as nursing, discussions with consultants, evaluation of patient's response to treatment, examination of patient, obtaining history from patient or surrogate, ordering and performing treatments and interventions, ordering and review of laboratory studies, ordering and review of radiographic studies, pulse oximetry and re-evaluation of patient's condition.   ____________________________________________   INITIAL IMPRESSION / ASSESSMENT AND PLAN / ED COURSE  Pertinent labs & imaging results that were available during my care of the patient were reviewed by me and considered in my medical decision making (see chart for details).  Patient here with altered bowel sounds, she is awake and alert here. No fever no vomiting. Slight cough. Urine does show a repeat infection, urine culture is pending. I have ordered Rocephin on her for this as she did tolerate last time despite her penicillin allergy and this is really sufficient to cause or complaints. However, patient does have a slight cough as well and did a chest x-ray chest x-ray comes back for pneumonia. After consultation with pharmacy, we will likely have to change her to the hospital acquired pneumonia with meropenem and vancomycin however she is already getting a dose of Rocephin. They feel that starting her on the meropenem after this first dose would be acceptable.  Patient does not appear to be septic, lactic is borderline elevated, however her vital signs are quite reassuring, I don't think she requires a huge fluid bolus and we will adequately hydrate her here for what could be early sepsis and monitor closely.    ____________________________________________   FINAL CLINICAL IMPRESSION(S) / ED DIAGNOSES  Final diagnoses:  Altered mental status, unspecified altered mental status type  Lower urinary tract infectious disease      This chart was dictated using voice recognition software.  Despite best efforts to proofread,  errors can occur which can change meaning.      Jeanmarie Plant, MD 01/01/17 1739

## 2017-01-02 LAB — BASIC METABOLIC PANEL
Anion gap: 7 (ref 5–15)
BUN: 18 mg/dL (ref 6–20)
CO2: 26 mmol/L (ref 22–32)
Calcium: 8.6 mg/dL — ABNORMAL LOW (ref 8.9–10.3)
Chloride: 102 mmol/L (ref 101–111)
Creatinine, Ser: 0.64 mg/dL (ref 0.44–1.00)
GFR calc Af Amer: >60 mL/min (ref >60)
GFR calc non Af Amer: >60 mL/min (ref >60)
Glucose, Bld: 116 mg/dL — ABNORMAL HIGH (ref 65–99)
Potassium: 3.9 mmol/L (ref 3.5–5.1)
Sodium: 135 mmol/L (ref 135–145)

## 2017-01-02 LAB — CBC
HCT: 36.9 % (ref 35.0–47.0)
Hemoglobin: 12.2 g/dL (ref 12.0–16.0)
MCH: 26.4 pg (ref 26.0–34.0)
MCHC: 33.1 g/dL (ref 32.0–36.0)
MCV: 79.7 fL — ABNORMAL LOW (ref 80.0–100.0)
Platelets: 241 10*3/uL (ref 150–440)
RBC: 4.63 MIL/uL (ref 3.80–5.20)
RDW: 15.5 % — ABNORMAL HIGH (ref 11.5–14.5)
WBC: 14.2 10*3/uL — ABNORMAL HIGH (ref 3.6–11.0)

## 2017-01-02 MED ORDER — INFLUENZA VAC SPLIT HIGH-DOSE 0.5 ML IM SUSY
0.5000 mL | PREFILLED_SYRINGE | INTRAMUSCULAR | Status: DC
  Filled 2017-01-02: qty 0.5

## 2017-01-02 MED ORDER — BACLOFEN 20 MG PO TABS: 20.0000 mg | ORAL_TABLET | Freq: Every evening | ORAL | Status: DC

## 2017-01-02 MED ORDER — BACLOFEN 10 MG PO TABS
10.0000 mg | ORAL_TABLET | Freq: Every morning | ORAL | Status: DC
  Administered 2017-01-03: 10 mg via ORAL
  Filled 2017-01-02 (×2): qty 1

## 2017-01-02 MED ORDER — BACLOFEN 10 MG PO TABS
20.0000 mg | ORAL_TABLET | Freq: Every evening | ORAL | Status: DC
  Administered 2017-01-02: 20 mg via ORAL
  Filled 2017-01-02: qty 1
  Filled 2017-01-02: qty 2

## 2017-01-02 NOTE — Progress Notes (Signed)
[employer] [facility] [place] at [facility]   [name]    MR#:  [id number]  DATE OF BIRTH:  1945-11-15  SUBJECTIVE:   Pt. Here due to AMS and suspected to have a UTI.  Mental status improved and son at bedside.  No cough, shortness of breath, wheezing noted.    REVIEW OF SYSTEMS:    Review of Systems  Constitutional: Negative for chills and fever.  HENT: Negative for congestion and tinnitus.   Eyes: Negative for blurred vision and double vision.  Respiratory: Negative for cough, shortness of breath and wheezing.   Cardiovascular: Negative for chest pain, orthopnea and PND.  Gastrointestinal: Negative for abdominal pain, diarrhea, nausea and vomiting.  Genitourinary: Negative for dysuria and hematuria.  Neurological: Negative for dizziness, sensory change and focal weakness.  All other systems reviewed and are negative.   Nutrition: Heart Healthy Tolerating Diet: Yes Tolerating PT: pt. Is bedbound.   DRUG ALLERGIES:   Allergies  Allergen Reactions  . Ambien [Zolpidem Tartrate] Other (See Comments)    Reaction: altered mental status  . Amoxicillin Other (See Comments)    Reaction: blisters in mouth Has patient had a PCN reaction causing immediate rash, facial/tongue/throat swelling, SOB or lightheadedness with hypotension: Yes Has patient had a PCN reaction causing severe rash involving mucus membranes or skin necrosis: No Has patient had a PCN reaction that required hospitalization No Has patient had a PCN reaction occurring within the last 10 years: Yes If all of the above answers are "NO", then may proceed with Cephalosporin use.     VITALS:  Blood pressure (!) 142/75, pulse 99, temperature 97.7 F (36.5 C), temperature source Axillary, resp. rate 18, height  (1.778 m), weight 93 kg (205 lb), SpO2 95 %.  PHYSICAL EXAMINATION:   Physical Exam  GENERAL:  71 y.o.-year-old obese patient lying in bed in no acute distress.  EYES:  Pupils equal, round, reactive to light and accommodation. No scleral icterus. Extraocular muscles intact.  HEENT: Head atraumatic, normocephalic. Oropharynx and nasopharynx clear.  NECK:  Supple, no jugular venous distention. No thyroid enlargement, no tenderness.  LUNGS: Normal breath sounds bilaterally, no wheezing, rales, rhonchi. No use of accessory muscles of respiration.  CARDIOVASCULAR: S1, S2 normal. No murmurs, rubs, or gallops.  ABDOMEN: Soft, nontender, nondistended. Bowel sounds present. No organomegaly or mass.  EXTREMITIES: No cyanosis, clubbing or edema b/l.    NEUROLOGIC: Cranial nerves II through XII are intact. No focal Motor or sensory deficits b/l.  Globally weak.  PSYCHIATRIC: The patient is alert and oriented x 2.  SKIN: No obvious rash, lesion, or ulcer.    LABORATORY PANEL:   CBC  Recent Labs Lab 01/02/17 0407  WBC 14.2*  HGB 12.2  HCT 36.9  PLT 241   ------------------------------------------------------------------------------------------------------------------  Chemistries   Recent Labs Lab 01/01/17 1414 01/02/17 0407  NA 133* 135  K 4.7 3.9  CL 95* 102  CO2 25 26  GLUCOSE 134* 116*  BUN 16 18  CREATININE 0.75 0.64  CALCIUM 9.3 8.6*  AST 35  --   ALT 25  --   ALKPHOS 142*  --   BILITOT 0.7  --    ------------------------------------------------------------------------------------------------------------------  Cardiac Enzymes No results for input(s): TROPONINI in the last 168 hours. ------------------------------------------------------------------------------------------------------------------  RADIOLOGY:  Dg Chest 2 View  Result Date: 01/01/2017 CLINICAL DATA:  Altered mental status. Recent UTI. History of hypertension and bronchitis. EXAM: CHEST  2 VIEW COMPARISON:  Chest x-rays dated 08/24/2016 and  08/14/2016. FINDINGS: Stable cardiomegaly. Lungs are hyperexpanded. Opacity at the left lung base. Probable small left pleural  effusion. No acute or suspicious osseous finding. IMPRESSION: 1. Airspace opacity at the left lung base, suspicious for pneumonia, alternatively atelectasis or asymmetric edema. 2. Probable small left pleural effusion. 3. Stable cardiomegaly.  No evidence of CHF. 4. Hyperexpanded lungs suggesting COPD. Electronically Signed   By: Bary Richard M.D.   On: 01/01/2017 16:27     ASSESSMENT AND PLAN:   71 year old female with past medical history morbid obesity, hypertension, hyperlipidemia, hypothyroidism, depression chronic pain, history of recurrent urinary tract infections who presented to the hospital due to  Altered mental status  1. Altered mental status-metabolic encephalopathy secondary to urinary tract infection - Improving with IV antibiotics and will cont. To monitor.   2. UTI - cont. IV Cefepime and await Urine cultures.  - afebrile. WBC count trending down.  - previously had yeast in the urine and currently on Diflucan but likely has no significance.    3. Sepsis - due to UTI.  - pt. Presented with elevated WBC, abnormal UA.  - cont. IV Cefepime and follow cultures. Improving.   4. hypothyroidism- cont. Synthroid.   5. Depression - cont. Effexor, Abilify.   6. Neuropathy - cont.  Lyrica.    Possible d/c home tomorrow on Oral abx.   All the records are reviewed and case discussed with Care Management/Social Worker. Management plans discussed with the patient, family and they are in agreement.  CODE STATUS: Full code  DVT Prophylaxis: Lovenox  TOTAL TIME TAKING CARE OF THIS PATIENT: 30 minutes.   POSSIBLE D/C IN 1-2 DAYS, DEPENDING ON CLINICAL CONDITION.   Houston Siren M.D on 01/02/2017 at 1:18 PM  Between 7am to 6pm - Pager - [phone]  After 6pm go to [email or web address] - Therapist, [facility]  Office  [phone]  CC: Primary care physician; Center, [facility]

## 2017-01-03 LAB — CBC
HCT: 35 % (ref 35.0–47.0)
Hemoglobin: 11.7 g/dL — ABNORMAL LOW (ref 12.0–16.0)
MCH: 26.4 pg (ref 26.0–34.0)
MCHC: 33.4 g/dL (ref 32.0–36.0)
MCV: 79.1 fL — ABNORMAL LOW (ref 80.0–100.0)
Platelets: 245 10*3/uL (ref 150–440)
RBC: 4.43 MIL/uL (ref 3.80–5.20)
RDW: 15.4 % — ABNORMAL HIGH (ref 11.5–14.5)
WBC: 9.9 10*3/uL (ref 3.6–11.0)

## 2017-01-03 LAB — URINE CULTURE: Culture: 70000 — AB

## 2017-01-03 MED ORDER — LEVOFLOXACIN 500 MG PO TABS: 500.0000 mg | ORAL_TABLET | Freq: Every day | ORAL | 0 refills | Status: AC

## 2017-01-03 NOTE — Progress Notes (Signed)
[facility] called

## 2017-01-03 NOTE — Discharge Summary (Signed)
[facility] [place] [facility] [place]   [name]    MR#:  [id number]  DATE OF BIRTH:  [birth year]  DATE OF ADMISSION:  01/01/2017 ADMITTING PHYSICIAN: Shaune Pollack, MD  DATE OF DISCHARGE: 01/03/2017  PRIMARY CARE PHYSICIAN: [facility]    ADMISSION DIAGNOSIS:  Lower urinary tract infectious disease [N39.0] Altered mental status, unspecified altered mental status type [R41.82] Pneumonia due to infectious organism, unspecified laterality, unspecified part of lung [J18.9]  DISCHARGE DIAGNOSIS:  Active Problems:   Sepsis (HCC)   SECONDARY DIAGNOSIS:   Past Medical History:  Diagnosis Date  . Chronic pain   . Depression   . HLD (hyperlipidemia)   . HTN (hypertension)   . Hypothyroidism   . Morbid obesity (HCC)   . Recurrent UTI   . Spinal stenosis   . Visual hallucinations     HOSPITAL COURSE:   71 year old female with past medical history morbid obesity, hypertension, hyperlipidemia, hypothyroidism, depression chronic pain, history of recurrent urinary tract infections who presented to the hospital due to  Altered mental status  1. Altered mental status-metabolic encephalopathy secondary to urinary tract infection - patient was given IV antibiotics with cefepime and her mental status has improved and is back to baseline. - she is being  On oral antibiotics to treat her urinary tract infection.  2. UTI - while in the hospital patient was given IV cefepime, height urine cultures have remained negative so far. A rectal count has normalized, she is afebrile and hemodynamically stable. She is being discharged on oral Levaquin for additional 5 days.  3. Sepsis - due to UTI.  - pt. Presented with elevated WBC, abnormal UA.  - initially given broad-spectrum IV antibiotics including vancomycin, cefepime then narrowed down to just IV cefepime and now being discharged on oral Levaquin. She is afebrile, hemodynamically  stable and her white cell count has normalized now. Her blood cultures and urine cultures remained negative.  4. hypothyroidism- she will cont. Synthroid.   5. Depression - she will cont. Effexor, Abilify.   6. Neuropathy - she will cont.  Lyrica.    DISCHARGE CONDITIONS:   Stable.   CONSULTS OBTAINED:    DRUG ALLERGIES:   Allergies  Allergen Reactions  . Ambien [Zolpidem Tartrate] Other (See Comments)    Reaction: altered mental status  . Amoxicillin Other (See Comments)    Reaction: blisters in mouth Has patient had a PCN reaction causing immediate rash, facial/tongue/throat swelling, SOB or lightheadedness with hypotension: Yes Has patient had a PCN reaction causing severe rash involving mucus membranes or skin necrosis: No Has patient had a PCN reaction that required hospitalization No Has patient had a PCN reaction occurring within the last 10 years: Yes If all of the above answers are "NO", then may proceed with Cephalosporin use.     DISCHARGE MEDICATIONS:   Allergies as of 01/03/2017      Reactions   Ambien [zolpidem Tartrate] Other (See Comments)   Reaction: altered mental status   Amoxicillin Other (See Comments)   Reaction: blisters in mouth Has patient had a PCN reaction causing immediate rash, facial/tongue/throat swelling, SOB or lightheadedness with hypotension: Yes Has patient had a PCN reaction causing severe rash involving mucus membranes or skin necrosis: No Has patient had a PCN reaction that required hospitalization No Has patient had a PCN reaction occurring within the last 10 years: Yes If all of the above answers are "NO", then may proceed with Cephalosporin use.  Medication List    TAKE these medications   acetaminophen 325 MG tablet Commonly known as:  TYLENOL Take 650 mg by mouth every 6 (six) hours as needed for mild pain.   albuterol 108 (90 Base) MCG/ACT inhaler Commonly known as:  PROVENTIL HFA;VENTOLIN HFA Inhale 2 puffs  into the lungs every 4 (four) hours as needed for wheezing or shortness of breath.   albuterol (2.5 MG/3ML) 0.083% nebulizer solution Commonly known as:  PROVENTIL Take 2.5 mg by nebulization every 6 (six) hours as needed for wheezing or shortness of breath.   amlodipine-atorvastatin 2.5-10 MG tablet Commonly known as:  CADUET Take 1 tablet by mouth daily.   ARIPiprazole 2 MG tablet Commonly known as:  ABILIFY Take 2 mg by mouth daily.   aspirin EC 81 MG tablet Take 81 mg by mouth daily.   baclofen 20 MG tablet Commonly known as:  LIORESAL Take 5-20 mg by mouth 3 (three) times daily. 10 mg every morning and 20 mg every evening   docusate sodium 100 MG capsule Commonly known as:  COLACE Take 100 mg by mouth 2 (two) times daily.   fluconazole 200 MG tablet Commonly known as:  DIFLUCAN Take 1 tablet (200 mg total) by mouth daily.   fluticasone 50 MCG/ACT nasal spray Commonly known as:  FLONASE Place 2 sprays into both nostrils daily as needed. For congestion   furosemide 20 MG tablet Commonly known as:  LASIX Take 20 mg by mouth daily.   hydrocortisone 2.5 % rectal cream Commonly known as:  ANUSOL-HC Place 1 application rectally at bedtime as needed for hemorrhoids or itching.   levofloxacin 500 MG tablet Commonly known as:  LEVAQUIN Take 1 tablet (500 mg total) by mouth daily.   levothyroxine 150 MCG tablet Commonly known as:  SYNTHROID, LEVOTHROID Take 150 mcg by mouth daily before breakfast.   mupirocin ointment 2 % Commonly known as:  BACTROBAN Place 1 application into the nose 2 (two) times daily. Apply to open skin on toes   nystatin cream Commonly known as:  MYCOSTATIN Apply 1 application topically 2 (two) times daily. Apply to rash in skin folds   penciclovir 1 % cream Commonly known as:  DENAVIR Apply 1 application topically 5 (five) times daily as needed. Apply to lip for lesions   polyethylene glycol packet Commonly known as:  MIRALAX /  GLYCOLAX Take 17 g by mouth daily.   potassium chloride SA 20 MEQ tablet Commonly known as:  K-DUR,KLOR-CON Take 30 mEq by mouth daily.   pregabalin 150 MG capsule Commonly known as:  LYRICA Take 150 mg by mouth 3 (three) times daily.   senna-docusate 8.6-50 MG tablet Commonly known as:  Senokot-S Take 2 tablets by mouth daily.   Simethicone 180 MG Caps Take 2 capsules by mouth 3 (three) times daily.   traZODone 100 MG tablet Commonly known as:  DESYREL Take 50 mg by mouth at bedtime.   triamcinolone ointment 0.1 % Commonly known as:  KENALOG Apply 1 application topically 2 (two) times daily.   Venlafaxine HCl 225 MG Tb24 Take 225 mg by mouth daily.   vitamin B-12 1000 MCG tablet Commonly known as:  CYANOCOBALAMIN Take 1,000 mcg by mouth daily.   Vitamin D (Ergocalciferol) 50000 units Caps capsule Commonly known as:  DRISDOL Take 50,000 Units by mouth every 30 (thirty) days.   Zinc Oxide 13 % Crea Apply 1 application topically as needed for irritation.  Discharge Care Instructions        Start     Ordered   01/03/17 0000  levofloxacin (LEVAQUIN) 500 MG tablet  Daily     01/03/17 0859   01/03/17 0000  Activity as tolerated - No restrictions     01/03/17 0859   01/03/17 0000  Diet - low sodium heart healthy     01/03/17 0859        DISCHARGE INSTRUCTIONS:   DIET:  Cardiac diet and Diabetic diet  DISCHARGE CONDITION:  Stable  ACTIVITY:  Activity as tolerated  OXYGEN:  Home Oxygen: Yes.     Oxygen Delivery: room air  DISCHARGE LOCATION:  home   If you experience worsening of your admission symptoms, develop shortness of breath, life threatening emergency, suicidal or homicidal thoughts you must seek medical attention immediately by calling 911 or calling your MD immediately  if symptoms less severe.  You Must read complete instructions/literature along with all the possible adverse reactions/side effects for all the Medicines you  take and that have been prescribed to you. Take any new Medicines after you have completely understood and accpet all the possible adverse reactions/side effects.   Please note  You were cared for by a hospitalist during your hospital stay. If you have any questions about your discharge medications or the care you received while you were in the hospital after you are discharged, you can call the unit and asked to speak with the hospitalist on call if the hospitalist that took care of you is not available. Once you are discharged, your primary care physician will handle any further medical issues. Please note that NO REFILLS for any discharge medications will be authorized once you are discharged, as it is imperative that you return to your primary care physician (or establish a relationship with a primary care physician if you do not have one) for your aftercare needs so that they can reassess your need for medications and monitor your lab values.     Today   No acute events overnight. Mental status back to baseline. Afebrile. Cultures remain (-) and WBC count normal today.   VITAL SIGNS:  Blood pressure 121/74, pulse (!) 143, temperature 97.6 F (36.4 C), temperature source Oral, resp. rate 18, height 5\' 10"  (1.778 m), weight 93 kg (205 lb), SpO2 95 %.  I/O:   Intake/Output Summary (Last 24 hours) at 01/03/17 1200 Last data filed at 01/03/17 0800  Gross per 24 hour  Intake              120 ml  Output             1000 ml  Net             -880 ml    PHYSICAL EXAMINATION:   GENERAL:  71 y.o.-year-old obese patient lying in bed in no acute distress.  EYES: Pupils equal, round, reactive to light and accommodation. No scleral icterus. Extraocular muscles intact.  HEENT: Head atraumatic, normocephalic. Oropharynx and nasopharynx clear.  NECK:  Supple, no jugular venous distention. No thyroid enlargement, no tenderness.  LUNGS: Normal breath sounds bilaterally, no wheezing, rales, rhonchi.  No use of accessory muscles of respiration.  CARDIOVASCULAR: S1, S2 normal. No murmurs, rubs, or gallops.  ABDOMEN: Soft, nontender, nondistended. Bowel sounds present. No organomegaly or mass.  EXTREMITIES: No cyanosis, clubbing or edema b/l.    NEUROLOGIC: Cranial nerves II through XII are intact. No focal Motor or sensory deficits b/l.  Globally weak.  PSYCHIATRIC: The patient is alert and oriented x 2.  SKIN: No obvious rash, lesion, or ulcer.    DATA REVIEW:   CBC  Recent Labs Lab 01/03/17 0401  WBC 9.9  HGB 11.7*  HCT 35.0  PLT 245    Chemistries   Recent Labs Lab 01/01/17 1414 01/02/17 0407  NA 133* 135  K 4.7 3.9  CL 95* 102  CO2 25 26  GLUCOSE 134* 116*  BUN 16 18  CREATININE 0.75 0.64  CALCIUM 9.3 8.6*  AST 35  --   ALT 25  --   ALKPHOS 142*  --   BILITOT 0.7  --     Cardiac Enzymes No results for input(s): TROPONINI in the last 168 hours.  Microbiology Results  Results for orders placed or performed during the hospital encounter of 01/01/17  Culture, blood (routine x 2)     Status: None (Preliminary result)   Collection Time: 01/01/17  3:32 PM  Result Value Ref Range Status   Specimen Description BLOOD Blood Culture adequate volume  Final   Special Requests BLOOD RIGHT HAND  Final   Culture NO GROWTH 2 DAYS  Final   Report Status PENDING  Incomplete  Culture, blood (routine x 2)     Status: None (Preliminary result)   Collection Time: 01/01/17  3:32 PM  Result Value Ref Range Status   Specimen Description BLOOD Blood Culture adequate volume  Final   Special Requests BLOOD LEFT WRIST  Final   Culture NO GROWTH 2 DAYS  Final   Report Status PENDING  Incomplete  MRSA PCR Screening     Status: None   Collection Time: 01/01/17  8:39 PM  Result Value Ref Range Status   MRSA by PCR NEGATIVE NEGATIVE Final    Comment:        The GeneXpert MRSA Assay (FDA approved for NASAL specimens only), is one component of a comprehensive MRSA  colonization surveillance program. It is not intended to diagnose MRSA infection nor to guide or monitor treatment for MRSA infections.     RADIOLOGY:  Dg Chest 2 View  Result Date: 01/01/2017 CLINICAL DATA:  Altered mental status. Recent UTI. History of hypertension and bronchitis. EXAM: CHEST  2 VIEW COMPARISON:  Chest x-rays dated 08/24/2016 and 08/14/2016. FINDINGS: Stable cardiomegaly. Lungs are hyperexpanded. Opacity at the left lung base. Probable small left pleural effusion. No acute or suspicious osseous finding. IMPRESSION: 1. Airspace opacity at the left lung base, suspicious for pneumonia, alternatively atelectasis or asymmetric edema. 2. Probable small left pleural effusion. 3. Stable cardiomegaly.  No evidence of CHF. 4. Hyperexpanded lungs suggesting COPD. Electronically Signed   By: Bary Richard M.D.   On: 01/01/2017 16:27      Management plans discussed with the patient, family and they are in agreement.  CODE STATUS:     Code Status Orders        Start     Ordered   01/01/17 2007  Full code  Continuous     01/01/17 2006  Advance Directive Documentation     Most Recent Value  Type of Advance Directive  Healthcare Power of Attorney [Frankie Switalski]  Pre-existing out of facility DNR order (yellow form or pink MOST form)  -  "MOST" Form in Place?  -      TOTAL TIME TAKING CARE OF THIS PATIENT: 40 minutes.    Houston Siren M.D on 01/03/2017 at 12:00 PM  Between 7am to 6pm - Pager - [phone]  After 6pm go to [email or web address] - Therapist, [facility]  Office  [phone]  CC: Primary care physician; [facility]

## 2017-01-03 NOTE — Progress Notes (Addendum)
Patient is being discharged to home. Transport will be via EMS. DC and RX instructions given to patient and son who is at bedside. Both acknowledged understanding of instructions and had no further questions. IV removed, belongings packed.

## 2017-01-06 LAB — CULTURE, BLOOD (ROUTINE X 2)
Culture: NO GROWTH
Culture: NO GROWTH
Specimen Description: ADEQUATE
Specimen Description: ADEQUATE

## 2017-01-15 ENCOUNTER — Emergency Department: Payer: Medicare Other

## 2017-01-15 ENCOUNTER — Encounter: Payer: Self-pay | Admitting: Emergency Medicine

## 2017-01-15 ENCOUNTER — Inpatient Hospital Stay
Admission: EM | Admit: 2017-01-15 | Discharge: 2017-02-11 | DRG: 208 | Disposition: A | Discharge status: Home / Self Care | Payer: Medicare Other | Attending: Internal Medicine | Admitting: Internal Medicine

## 2017-01-15 DIAGNOSIS — N132 Hydronephrosis with renal and ureteral calculous obstruction: Secondary | ICD-10-CM | POA: Diagnosis present

## 2017-01-15 DIAGNOSIS — I110 Hypertensive heart disease with heart failure: Secondary | ICD-10-CM | POA: Diagnosis present

## 2017-01-15 DIAGNOSIS — A419 Sepsis, unspecified organism: Secondary | ICD-10-CM | POA: Diagnosis not present

## 2017-01-15 DIAGNOSIS — I5033 Acute on chronic diastolic (congestive) heart failure: Secondary | ICD-10-CM | POA: Diagnosis present

## 2017-01-15 DIAGNOSIS — J9601 Acute respiratory failure with hypoxia: Secondary | ICD-10-CM | POA: Diagnosis not present

## 2017-01-15 DIAGNOSIS — N200 Calculus of kidney: Secondary | ICD-10-CM | POA: Diagnosis not present

## 2017-01-15 DIAGNOSIS — R34 Anuria and oliguria: Secondary | ICD-10-CM | POA: Diagnosis not present

## 2017-01-15 DIAGNOSIS — N1339 Other hydronephrosis: Secondary | ICD-10-CM | POA: Diagnosis present

## 2017-01-15 DIAGNOSIS — G8220 Paraplegia, unspecified: Secondary | ICD-10-CM | POA: Diagnosis present

## 2017-01-15 DIAGNOSIS — Z87891 Personal history of nicotine dependence: Secondary | ICD-10-CM

## 2017-01-15 DIAGNOSIS — E440 Moderate protein-calorie malnutrition: Secondary | ICD-10-CM | POA: Diagnosis not present

## 2017-01-15 DIAGNOSIS — Y95 Nosocomial condition: Secondary | ICD-10-CM | POA: Diagnosis present

## 2017-01-15 DIAGNOSIS — Z01818 Encounter for other preprocedural examination: Secondary | ICD-10-CM

## 2017-01-15 DIAGNOSIS — M4800 Spinal stenosis, site unspecified: Secondary | ICD-10-CM | POA: Diagnosis present

## 2017-01-15 DIAGNOSIS — J189 Pneumonia, unspecified organism: Principal | ICD-10-CM | POA: Diagnosis present

## 2017-01-15 DIAGNOSIS — J918 Pleural effusion in other conditions classified elsewhere: Secondary | ICD-10-CM | POA: Diagnosis present

## 2017-01-15 DIAGNOSIS — J810 Acute pulmonary edema: Secondary | ICD-10-CM | POA: Clinically undetermined

## 2017-01-15 DIAGNOSIS — G9341 Metabolic encephalopathy: Secondary | ICD-10-CM | POA: Diagnosis present

## 2017-01-15 DIAGNOSIS — E872 Acidosis: Secondary | ICD-10-CM | POA: Diagnosis not present

## 2017-01-15 DIAGNOSIS — Z1632 Resistance to antifungal drug(s): Secondary | ICD-10-CM | POA: Diagnosis not present

## 2017-01-15 DIAGNOSIS — N3941 Urge incontinence: Secondary | ICD-10-CM | POA: Diagnosis not present

## 2017-01-15 DIAGNOSIS — B3749 Other urogenital candidiasis: Secondary | ICD-10-CM | POA: Diagnosis present

## 2017-01-15 DIAGNOSIS — N1330 Unspecified hydronephrosis: Secondary | ICD-10-CM

## 2017-01-15 DIAGNOSIS — J9622 Acute and chronic respiratory failure with hypercapnia: Secondary | ICD-10-CM | POA: Diagnosis not present

## 2017-01-15 DIAGNOSIS — J90 Pleural effusion, not elsewhere classified: Secondary | ICD-10-CM | POA: Diagnosis present

## 2017-01-15 DIAGNOSIS — J9602 Acute respiratory failure with hypercapnia: Secondary | ICD-10-CM | POA: Diagnosis not present

## 2017-01-15 DIAGNOSIS — E669 Obesity, unspecified: Secondary | ICD-10-CM | POA: Diagnosis present

## 2017-01-15 DIAGNOSIS — Z881 Allergy status to other antibiotic agents status: Secondary | ICD-10-CM

## 2017-01-15 DIAGNOSIS — R059 Cough, unspecified: Secondary | ICD-10-CM

## 2017-01-15 DIAGNOSIS — N179 Acute kidney failure, unspecified: Secondary | ICD-10-CM | POA: Diagnosis not present

## 2017-01-15 DIAGNOSIS — E785 Hyperlipidemia, unspecified: Secondary | ICD-10-CM | POA: Diagnosis present

## 2017-01-15 DIAGNOSIS — Z79899 Other long term (current) drug therapy: Secondary | ICD-10-CM

## 2017-01-15 DIAGNOSIS — R339 Retention of urine, unspecified: Secondary | ICD-10-CM | POA: Diagnosis not present

## 2017-01-15 DIAGNOSIS — R410 Disorientation, unspecified: Secondary | ICD-10-CM | POA: Diagnosis not present

## 2017-01-15 DIAGNOSIS — R400 Somnolence: Secondary | ICD-10-CM | POA: Diagnosis not present

## 2017-01-15 DIAGNOSIS — N329 Bladder disorder, unspecified: Secondary | ICD-10-CM | POA: Diagnosis not present

## 2017-01-15 DIAGNOSIS — D649 Anemia, unspecified: Secondary | ICD-10-CM | POA: Diagnosis not present

## 2017-01-15 DIAGNOSIS — R4182 Altered mental status, unspecified: Secondary | ICD-10-CM

## 2017-01-15 DIAGNOSIS — F05 Delirium due to known physiological condition: Secondary | ICD-10-CM | POA: Diagnosis present

## 2017-01-15 DIAGNOSIS — N2889 Other specified disorders of kidney and ureter: Secondary | ICD-10-CM | POA: Diagnosis present

## 2017-01-15 DIAGNOSIS — R6521 Severe sepsis with septic shock: Secondary | ICD-10-CM | POA: Diagnosis not present

## 2017-01-15 DIAGNOSIS — J9690 Respiratory failure, unspecified, unspecified whether with hypoxia or hypercapnia: Secondary | ICD-10-CM

## 2017-01-15 DIAGNOSIS — I472 Ventricular tachycardia: Secondary | ICD-10-CM | POA: Diagnosis not present

## 2017-01-15 DIAGNOSIS — Z452 Encounter for adjustment and management of vascular access device: Secondary | ICD-10-CM

## 2017-01-15 DIAGNOSIS — Z888 Allergy status to other drugs, medicaments and biological substances status: Secondary | ICD-10-CM

## 2017-01-15 DIAGNOSIS — R0902 Hypoxemia: Secondary | ICD-10-CM

## 2017-01-15 DIAGNOSIS — Z7401 Bed confinement status: Secondary | ICD-10-CM

## 2017-01-15 DIAGNOSIS — Z7982 Long term (current) use of aspirin: Secondary | ICD-10-CM

## 2017-01-15 DIAGNOSIS — Z6828 Body mass index (BMI) 28.0-28.9, adult: Secondary | ICD-10-CM

## 2017-01-15 DIAGNOSIS — Z538 Procedure and treatment not carried out for other reasons: Secondary | ICD-10-CM | POA: Diagnosis not present

## 2017-01-15 DIAGNOSIS — F333 Major depressive disorder, recurrent, severe with psychotic symptoms: Secondary | ICD-10-CM

## 2017-01-15 DIAGNOSIS — E039 Hypothyroidism, unspecified: Secondary | ICD-10-CM | POA: Diagnosis present

## 2017-01-15 DIAGNOSIS — R0602 Shortness of breath: Secondary | ICD-10-CM

## 2017-01-15 DIAGNOSIS — J9621 Acute and chronic respiratory failure with hypoxia: Secondary | ICD-10-CM | POA: Diagnosis not present

## 2017-01-15 DIAGNOSIS — R05 Cough: Secondary | ICD-10-CM

## 2017-01-15 DIAGNOSIS — I471 Supraventricular tachycardia: Secondary | ICD-10-CM | POA: Diagnosis not present

## 2017-01-15 DIAGNOSIS — N319 Neuromuscular dysfunction of bladder, unspecified: Secondary | ICD-10-CM | POA: Diagnosis present

## 2017-01-15 DIAGNOSIS — Z4659 Encounter for fitting and adjustment of other gastrointestinal appliance and device: Secondary | ICD-10-CM

## 2017-01-15 DIAGNOSIS — L89891 Pressure ulcer of other site, stage 1: Secondary | ICD-10-CM | POA: Diagnosis not present

## 2017-01-15 DIAGNOSIS — J9612 Chronic respiratory failure with hypercapnia: Secondary | ICD-10-CM | POA: Diagnosis not present

## 2017-01-15 DIAGNOSIS — N210 Calculus in bladder: Secondary | ICD-10-CM | POA: Diagnosis present

## 2017-01-15 DIAGNOSIS — T367X5A Adverse effect of antifungal antibiotics, systemically used, initial encounter: Secondary | ICD-10-CM | POA: Diagnosis not present

## 2017-01-15 DIAGNOSIS — E876 Hypokalemia: Secondary | ICD-10-CM | POA: Diagnosis not present

## 2017-01-15 DIAGNOSIS — I313 Pericardial effusion (noninflammatory): Secondary | ICD-10-CM | POA: Diagnosis present

## 2017-01-15 DIAGNOSIS — L89892 Pressure ulcer of other site, stage 2: Secondary | ICD-10-CM | POA: Diagnosis not present

## 2017-01-15 DIAGNOSIS — L89151 Pressure ulcer of sacral region, stage 1: Secondary | ICD-10-CM | POA: Diagnosis present

## 2017-01-15 DIAGNOSIS — Z9889 Other specified postprocedural states: Secondary | ICD-10-CM

## 2017-01-15 DIAGNOSIS — I4510 Unspecified right bundle-branch block: Secondary | ICD-10-CM | POA: Diagnosis not present

## 2017-01-15 DIAGNOSIS — G629 Polyneuropathy, unspecified: Secondary | ICD-10-CM | POA: Diagnosis present

## 2017-01-15 HISTORY — DX: Unspecified diastolic (congestive) heart failure: I50.30

## 2017-01-15 LAB — TROPONIN I: Troponin I: <0.03 ng/mL (ref <0.03)

## 2017-01-15 LAB — COMPREHENSIVE METABOLIC PANEL
ALT: 13 U/L — ABNORMAL LOW (ref 14–54)
AST: 23 U/L (ref 15–41)
Albumin: 2.5 g/dL — ABNORMAL LOW (ref 3.5–5.0)
Alkaline Phosphatase: 136 U/L — ABNORMAL HIGH (ref 38–126)
Anion gap: 11 (ref 5–15)
BUN: 11 mg/dL (ref 6–20)
CO2: 30 mmol/L (ref 22–32)
Calcium: 8.9 mg/dL (ref 8.9–10.3)
Chloride: 97 mmol/L — ABNORMAL LOW (ref 101–111)
Creatinine, Ser: 0.72 mg/dL (ref 0.44–1.00)
GFR calc Af Amer: >60 mL/min (ref >60)
GFR calc non Af Amer: >60 mL/min (ref >60)
Glucose, Bld: 92 mg/dL (ref 65–99)
Potassium: 3.5 mmol/L (ref 3.5–5.1)
Sodium: 138 mmol/L (ref 135–145)
Total Bilirubin: 0.5 mg/dL (ref 0.3–1.2)
Total Protein: 6.9 g/dL (ref 6.5–8.1)

## 2017-01-15 LAB — CBC WITH DIFFERENTIAL/PLATELET
Basophils Absolute: 0.1 10*3/uL (ref 0–0.1)
Basophils Relative: 1 %
Eosinophils Absolute: 0.2 10*3/uL (ref 0–0.7)
Eosinophils Relative: 2 %
HCT: 39.5 % (ref 35.0–47.0)
Hemoglobin: 13.1 g/dL (ref 12.0–16.0)
Lymphocytes Relative: 17 %
Lymphs Abs: 1.8 10*3/uL (ref 1.0–3.6)
MCH: 26.4 pg (ref 26.0–34.0)
MCHC: 33.2 g/dL (ref 32.0–36.0)
MCV: 79.6 fL — ABNORMAL LOW (ref 80.0–100.0)
Monocytes Absolute: 0.8 10*3/uL (ref 0.2–0.9)
Monocytes Relative: 8 %
Neutro Abs: 7.2 10*3/uL — ABNORMAL HIGH (ref 1.4–6.5)
Neutrophils Relative %: 72 %
Platelets: 337 10*3/uL (ref 150–440)
RBC: 4.96 MIL/uL (ref 3.80–5.20)
RDW: 15 % — ABNORMAL HIGH (ref 11.5–14.5)
WBC: 10.1 10*3/uL (ref 3.6–11.0)

## 2017-01-15 LAB — TSH: TSH: 0.753 u[IU]/mL (ref 0.350–4.500)

## 2017-01-15 LAB — LACTIC ACID, PLASMA: Lactic Acid, Venous: 1.5 mmol/L (ref 0.5–1.9)

## 2017-01-15 MED ORDER — ASPIRIN EC 81 MG PO TBEC
81.0000 mg | DELAYED_RELEASE_TABLET | Freq: Every day | ORAL | Status: DC
  Administered 2017-01-16 – 2017-02-01 (×13): 81 mg via ORAL
  Filled 2017-01-15 (×15): qty 1

## 2017-01-15 MED ORDER — FUROSEMIDE 40 MG PO TABS
20.0000 mg | ORAL_TABLET | Freq: Every day | ORAL | Status: DC
  Administered 2017-01-15 – 2017-01-21 (×5): 20 mg via ORAL
  Filled 2017-01-15 (×5): qty 1

## 2017-01-15 MED ORDER — NYSTATIN 100000 UNIT/GM EX CREA
1.0000 "application " | TOPICAL_CREAM | Freq: Two times a day (BID) | CUTANEOUS | Status: DC
  Administered 2017-01-18 – 2017-01-28 (×18): 1 via TOPICAL
  Filled 2017-01-15 (×3): qty 15

## 2017-01-15 MED ORDER — VANCOMYCIN HCL 10 G IV SOLR
1250.0000 mg | Freq: Two times a day (BID) | INTRAVENOUS | Status: DC
  Administered 2017-01-16 – 2017-01-17 (×4): 1250 mg via INTRAVENOUS
  Filled 2017-01-15 (×6): qty 1250

## 2017-01-15 MED ORDER — SENNOSIDES-DOCUSATE SODIUM 8.6-50 MG PO TABS
2.0000 | ORAL_TABLET | Freq: Every day | ORAL | Status: DC
  Administered 2017-01-16 – 2017-02-01 (×12): 2 via ORAL
  Filled 2017-01-15 (×14): qty 2

## 2017-01-15 MED ORDER — SODIUM CHLORIDE 0.9 % IV BOLUS (SEPSIS)
1000.0000 mL | Freq: Once | INTRAVENOUS | Status: AC
Start: 2017-01-15 — End: 2017-01-15
  Administered 2017-01-15: 1000 mL via INTRAVENOUS

## 2017-01-15 MED ORDER — ATORVASTATIN CALCIUM 10 MG PO TABS
10.0000 mg | ORAL_TABLET | Freq: Every day | ORAL | Status: DC
  Administered 2017-01-16 – 2017-02-02 (×14): 10 mg via ORAL
  Filled 2017-01-15 (×14): qty 1

## 2017-01-15 MED ORDER — ALBUTEROL SULFATE (2.5 MG/3ML) 0.083% IN NEBU
3.0000 mL | INHALATION_SOLUTION | RESPIRATORY_TRACT | Status: DC | PRN
  Administered 2017-01-20 – 2017-02-10 (×6): 3 mL via RESPIRATORY_TRACT
  Filled 2017-01-15 (×7): qty 3

## 2017-01-15 MED ORDER — TRAZODONE HCL 50 MG PO TABS
50.0000 mg | ORAL_TABLET | Freq: Every day | ORAL | Status: DC
  Administered 2017-01-15 – 2017-01-21 (×5): 50 mg via ORAL
  Filled 2017-01-15 (×6): qty 1

## 2017-01-15 MED ORDER — ACETAMINOPHEN 325 MG PO TABS: 650.0000 mg | ORAL_TABLET | Freq: Four times a day (QID) | ORAL | Status: DC | PRN

## 2017-01-15 MED ORDER — ACETAMINOPHEN 325 MG PO TABS
650.0000 mg | ORAL_TABLET | Freq: Four times a day (QID) | ORAL | Status: DC | PRN
  Filled 2017-01-15: qty 2

## 2017-01-15 MED ORDER — AMLODIPINE BESYLATE 5 MG PO TABS
2.5000 mg | ORAL_TABLET | Freq: Every day | ORAL | Status: DC
  Administered 2017-01-16 – 2017-01-29 (×11): 2.5 mg via ORAL
  Filled 2017-01-15 (×13): qty 1

## 2017-01-15 MED ORDER — DEXTROSE 5 % IV SOLN
2.0000 g | Freq: Once | INTRAVENOUS | Status: AC
  Administered 2017-01-15: 2 g via INTRAVENOUS
  Filled 2017-01-15: qty 2

## 2017-01-15 MED ORDER — DEXTROSE 5 % IV SOLN: 1.0000 g | Freq: Three times a day (TID) | INTRAVENOUS | Status: DC

## 2017-01-15 MED ORDER — VANCOMYCIN HCL IN DEXTROSE 1-5 GM/200ML-% IV SOLN
1000.0000 mg | Freq: Once | INTRAVENOUS | Status: DC
  Filled 2017-01-15: qty 200

## 2017-01-15 MED ORDER — FLUCONAZOLE 50 MG PO TABS
200.0000 mg | ORAL_TABLET | Freq: Every day | ORAL | Status: DC
  Administered 2017-01-15 – 2017-01-22 (×5): 200 mg via ORAL
  Filled 2017-01-15 (×8): qty 4

## 2017-01-15 MED ORDER — DOCUSATE SODIUM 100 MG PO CAPS
100.0000 mg | ORAL_CAPSULE | Freq: Two times a day (BID) | ORAL | Status: DC
  Administered 2017-01-15 – 2017-01-31 (×27): 100 mg via ORAL
  Filled 2017-01-15 (×29): qty 1

## 2017-01-15 MED ORDER — BACLOFEN 5 MG HALF TABLET
5.0000 mg | ORAL_TABLET | Freq: Three times a day (TID) | ORAL | Status: DC
  Administered 2017-01-16: 20 mg via ORAL

## 2017-01-15 MED ORDER — POLYETHYLENE GLYCOL 3350 17 G PO PACK
17.0000 g | PACK | Freq: Every day | ORAL | Status: DC
  Administered 2017-01-16 – 2017-01-29 (×5): 17 g via ORAL
  Filled 2017-01-15 (×11): qty 1

## 2017-01-15 MED ORDER — ONDANSETRON HCL 4 MG/2ML IJ SOLN
4.0000 mg | Freq: Four times a day (QID) | INTRAMUSCULAR | Status: DC | PRN
  Administered 2017-02-01 – 2017-02-09 (×3): 4 mg via INTRAVENOUS
  Filled 2017-01-15 (×3): qty 2

## 2017-01-15 MED ORDER — VITAMIN D (ERGOCALCIFEROL) 1.25 MG (50000 UNIT) PO CAPS
50000.0000 [IU] | ORAL_CAPSULE | ORAL | Status: DC
  Administered 2017-01-16: 50000 [IU] via ORAL
  Filled 2017-01-15: qty 1

## 2017-01-15 MED ORDER — BACLOFEN 10 MG PO TABS
5.0000 mg | ORAL_TABLET | Freq: Three times a day (TID) | ORAL | Status: DC
  Administered 2017-01-16 – 2017-01-17 (×4): 10 mg via ORAL
  Administered 2017-01-17: 20 mg via ORAL
  Administered 2017-01-18 (×2): 10 mg via ORAL
  Filled 2017-01-15 (×11): qty 2

## 2017-01-15 MED ORDER — FLUTICASONE PROPIONATE 50 MCG/ACT NA SUSP
2.0000 | Freq: Every day | NASAL | Status: DC | PRN
  Filled 2017-01-15: qty 16

## 2017-01-15 MED ORDER — ONDANSETRON HCL 4 MG PO TABS: 4.0000 mg | ORAL_TABLET | Freq: Four times a day (QID) | ORAL | Status: DC | PRN

## 2017-01-15 MED ORDER — VITAMIN B-12 1000 MCG PO TABS
1000.0000 ug | ORAL_TABLET | Freq: Every day | ORAL | Status: DC
  Administered 2017-01-15 – 2017-02-01 (×14): 1000 ug via ORAL
  Filled 2017-01-15 (×15): qty 1

## 2017-01-15 MED ORDER — ACETAMINOPHEN 650 MG RE SUPP: 650.0000 mg | Freq: Four times a day (QID) | RECTAL | Status: DC | PRN

## 2017-01-15 MED ORDER — MUPIROCIN 2 % EX OINT
1.0000 "application " | TOPICAL_OINTMENT | Freq: Two times a day (BID) | CUTANEOUS | Status: DC
  Administered 2017-01-15 – 2017-02-02 (×29): 1 via NASAL
  Filled 2017-01-15: qty 22

## 2017-01-15 MED ORDER — PREGABALIN 75 MG PO CAPS
150.0000 mg | ORAL_CAPSULE | Freq: Three times a day (TID) | ORAL | Status: DC
  Administered 2017-01-15 – 2017-02-02 (×37): 150 mg via ORAL
  Filled 2017-01-15 (×43): qty 2

## 2017-01-15 MED ORDER — IOPAMIDOL (ISOVUE-300) INJECTION 61%
75.0000 mL | Freq: Once | INTRAVENOUS | Status: AC | PRN
  Administered 2017-01-15: 75 mL via INTRAVENOUS

## 2017-01-15 MED ORDER — ENOXAPARIN SODIUM 40 MG/0.4ML ~~LOC~~ SOLN
40.0000 mg | SUBCUTANEOUS | Status: DC
  Administered 2017-01-15 – 2017-02-03 (×20): 40 mg via SUBCUTANEOUS
  Filled 2017-01-15 (×19): qty 0.4

## 2017-01-15 MED ORDER — ARIPIPRAZOLE 2 MG PO TABS
2.0000 mg | ORAL_TABLET | Freq: Every day | ORAL | Status: DC
  Administered 2017-01-15 – 2017-01-18 (×4): 2 mg via ORAL
  Filled 2017-01-15 (×6): qty 1

## 2017-01-15 MED ORDER — ZINC OXIDE 40 % EX OINT
1.0000 "application " | TOPICAL_OINTMENT | CUTANEOUS | Status: DC | PRN
  Filled 2017-01-15: qty 57

## 2017-01-15 MED ORDER — DEXTROSE 5 % IV SOLN
2.0000 g | Freq: Three times a day (TID) | INTRAVENOUS | Status: DC
  Administered 2017-01-15 – 2017-01-21 (×17): 2 g via INTRAVENOUS
  Filled 2017-01-15 (×21): qty 2

## 2017-01-15 MED ORDER — VENLAFAXINE HCL ER 75 MG PO CP24
225.0000 mg | ORAL_CAPSULE | Freq: Every day | ORAL | Status: DC
  Administered 2017-01-15 – 2017-01-18 (×4): 225 mg via ORAL
  Filled 2017-01-15 (×6): qty 3

## 2017-01-15 MED ORDER — LEVOTHYROXINE SODIUM 100 MCG PO TABS
150.0000 ug | ORAL_TABLET | Freq: Every day | ORAL | Status: DC
  Administered 2017-01-16 – 2017-02-02 (×15): 150 ug via ORAL
  Filled 2017-01-15 (×15): qty 3

## 2017-01-15 MED ORDER — AMLODIPINE-ATORVASTATIN 2.5-10 MG PO TABS: 1.0000 | ORAL_TABLET | Freq: Every day | ORAL | Status: DC

## 2017-01-15 MED ORDER — ALBUTEROL SULFATE (2.5 MG/3ML) 0.083% IN NEBU: 2.5000 mg | INHALATION_SOLUTION | Freq: Four times a day (QID) | RESPIRATORY_TRACT | Status: DC | PRN

## 2017-01-15 NOTE — ED Triage Notes (Signed)
Pt arrived via EMS from home with family. EMS reports family states patient has not been acting her baseline and that she is not able to follow conversations with them and is not making sense.  Pt has hx of UTIs and is bed bound due to spinal stenosis. On arrival pt is alert and answering questions appropriately, no speech changes noted. Mucus membranes dry.

## 2017-01-15 NOTE — ED Notes (Signed)
Unable to get blood return enough for labs, able to get one set of BC from left IV. Attempted straight stick x 1 again able to get blood return, but not able to get labs.  Both IV's flush

## 2017-01-15 NOTE — Progress Notes (Signed)
RN Elon Jester given aztreonam 2 gm IV x 1 for ED.

## 2017-01-15 NOTE — ED Notes (Signed)
CODE SEPSIS CALLED TO KIM AT [facility] 

## 2017-01-15 NOTE — ED Notes (Signed)
Patient transported to X-ray 

## 2017-01-15 NOTE — ED Provider Notes (Signed)
[facility] Emergency Department Provider Note  ____________________________________________   First MD Initiated Contact with Patient 01/15/17 1503     (approximate)  I have reviewed the triage vital signs and the nursing notes.   HISTORY  Chief Complaint Altered Mental Status   HPI Jessica [name] is a 71 y.o. 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 She is brought in by EMS who states that the patient has been speaking tangentially and has been slightly more confused than her baseline today. The patient has had recurrent UTI and the family called EMS because they're suspecting UTI. No fever reported. Patient denies any pain. Says that she is intermittently light headed but is not experiencing lightheadedness at this time.   Past Medical History:  Diagnosis Date  . Chronic pain   . Depression   . HLD (hyperlipidemia)   . HTN (hypertension)   . Hypothyroidism   . Morbid obesity (HCC)   . Recurrent UTI   . Spinal stenosis   . Visual hallucinations     Patient Active Problem List   Diagnosis Date Noted  . Sepsis (HCC) 01/01/2017  . Pressure injury of skin 12/19/2016  . UTI (urinary tract infection) 12/18/2016  . Tachycardia 09/12/2016  . Acute encephalopathy 08/14/2016  . Hallucinations 07/06/2016  . Hypokalemia 07/06/2016  . Sinus tachycardia 07/06/2016  . RECTAL PAIN 09/04/2009  . CONSTIPATION 07/08/2009  . DEEP VENOUS THROMBOPHLEBITIS, BILATERAL 01/07/2009  . PALLOR 01/07/2009  . DIFFICULTY IN [facility] 11/22/2008  . ABDOMINAL OR PELVIC SWELLING MASS OR LUMP LLQ 10/01/2008  . 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 HLD (hyperlipidemia) 12/10/2006  . ANXIETY 12/10/2006  . DEPRESSION 12/10/2006  . Essential hypertension 12/10/2006  . OSTEOARTHRITIS 12/10/2006  . Hypothyroidism 09/22/2006  .  HYPERCHOLESTEROLEMIA 09/22/2006  . ECZEMA 09/22/2006    Past Surgical History:  Procedure Laterality Date  . ABDOMINAL HYSTERECTOMY    . BACK SURGERY    . HEMORROIDECTOMY      Prior to Admission medications   Medication Sig Start Date End Date Taking? Authorizing Provider  acetaminophen (TYLENOL) 325 MG tablet Take 650 mg by mouth every 6 (six) hours as needed for mild pain.    [provider]  albuterol (PROVENTIL HFA;VENTOLIN HFA) 108 (90 Base) MCG/ACT inhaler Inhale 2 puffs into the lungs every 4 (four) hours as needed for wheezing or shortness of breath.    [provider]  albuterol (PROVENTIL) (2.5 MG/3ML) 0.083% nebulizer solution Take 2.5 mg by nebulization every 6 (six) hours as needed for wheezing or shortness of breath.    [provider]  amlodipine-atorvastatin (CADUET) 2.5-10 MG tablet Take 1 tablet by mouth daily.    [provider]  ARIPiprazole (ABILIFY) 2 MG tablet Take 2 mg by mouth daily. 08/27/16   [provider]  aspirin EC 81 MG tablet Take 81 mg by mouth daily.    [provider]  baclofen (LIORESAL) 20 MG tablet Take 5-20 mg by mouth 3 (three) times daily. 10 mg every morning and 20 mg every evening    [provider]  docusate sodium (COLACE) 100 MG capsule Take 100 mg by mouth 2 (two) times daily.    [provider]  fluconazole (DIFLUCAN) 200 MG tablet Take 1 tablet (200 mg total) by mouth daily. 12/20/16   Shaune Pollack, MD  fluticasone [facility]) 50 MCG/ACT nasal spray Place 2  sprays into both nostrils daily as needed. For congestion    [provider]  furosemide (LASIX) 20 MG tablet Take 20 mg by mouth daily.    [provider]  hydrocortisone (ANUSOL-HC) 2.5 % rectal cream Place 1 application rectally at bedtime as needed for hemorrhoids or itching.    [provider]  levothyroxine (SYNTHROID, LEVOTHROID) 150 MCG tablet Take 150 mcg by mouth daily before breakfast.     [provider]  mupirocin ointment (BACTROBAN) 2 % Place 1 application into the nose 2 (two) times daily. Apply to open skin on toes    [provider]  nystatin cream (MYCOSTATIN) Apply 1 application topically 2 (two) times daily. Apply to rash in skin folds    [provider]  penciclovir (DENAVIR) 1 % cream Apply 1 application topically 5 (five) times daily as needed. Apply to lip for lesions    [provider]  polyethylene glycol (MIRALAX / GLYCOLAX) packet Take 17 g by mouth daily.    [provider]  potassium chloride SA (K-DUR,KLOR-CON) 20 MEQ tablet Take 30 mEq by mouth daily.     [provider]  pregabalin (LYRICA) 150 MG capsule Take 150 mg by mouth 3 (three) times daily.    [provider]  senna-docusate (SENOKOT-S) 8.6-50 MG tablet Take 2 tablets by mouth daily.    [provider]  Simethicone 180 MG CAPS Take 2 capsules by mouth 3 (three) times daily.    [provider]  traZODone (DESYREL) 100 MG tablet Take 50 mg by mouth at bedtime.     [provider]  triamcinolone ointment (KENALOG) 0.1 % Apply 1 application topically 2 (two) times daily.    [provider]  Venlafaxine HCl 225 MG TB24 Take 225 mg by mouth daily.    [provider]  vitamin B-12 (CYANOCOBALAMIN) 1000 MCG tablet Take 1,000 mcg by mouth daily.    [provider]  Vitamin D, Ergocalciferol, (DRISDOL) 50000 units CAPS capsule Take 50,000 Units by mouth every 30 (thirty) days.    [provider]  Zinc Oxide 13 % CREA Apply 1 application topically as needed for irritation.    [provider]    Allergies Ambien [zolpidem tartrate] and Amoxicillin  Family History  Problem Relation Age of Onset  . Heart disease Father   . Deep vein thrombosis Father     Social History Social History  Substance Use Topics  . Smoking status: Never Smoker  . Smokeless tobacco: Former Neurosurgeon      Types: Snuff  . Alcohol use No    Review of Systems  Constitutional: No fever/chills Eyes: No visual changes. ENT: No sore throat. Cardiovascular: Denies chest pain. Respiratory: Denies shortness of breath. Gastrointestinal: No abdominal pain.  No nausea, no vomiting.  No diarrhea.  No constipation. Genitourinary: Negative for dysuria. Musculoskeletal: Negative for back pain. Skin: Negative for rash. Neurological: Negative for headaches, focal weakness or numbness.   ____________________________________________   PHYSICAL EXAM:  VITAL SIGNS: ED Triage Vitals  Enc Vitals Group     BP 01/15/17 1509 129/81     Pulse Rate 01/15/17 1509 86     Resp 01/15/17 1509 18     Temp 01/15/17 1509 (!) 97.4 F (36.3 C)     Temp Source 01/15/17 1509 Oral     SpO2 01/15/17 1500 98 %     Weight 01/15/17 1510 205 lb (93 kg)     Height 01/15/17 1510  (  1.778 m)     Head Circumference --      Peak Flow --      Pain Score --      Pain Loc --      Pain Edu? --      Excl. in GC? --     Constitutional: Alert and oriented. Well appearing and in no acute distress. Eyes: Conjunctivae are normal.  Head: Atraumatic. Nose: No congestion/rhinnorhea. Mouth/Throat: Mucous membranes are moist.  Neck: No stridor.   Cardiovascular: Normal rate, regular rhythm. Grossly normal heart sounds.  Respiratory: Normal respiratory effort.  No retractions. Lungs CTAB. Gastrointestinal: Soft and nontender. No distention.  Musculoskeletal: No lower extremity tenderness nor edema.  No joint effusions. Neurologic:  Normal speech and language. paralysis to the bilateral lower extremities. However, the patient is sensate to light touch bilaterally and equally. Psychiatric: Mood and affect are normal. Speech and behavior are normal.  ____________________________________________   LABS (all labs ordered are listed, but only abnormal results are displayed)  Labs Reviewed  URINE CULTURE  CBC WITH  DIFFERENTIAL/PLATELET  COMPREHENSIVE METABOLIC PANEL  URINALYSIS, COMPLETE (UACMP) WITH MICROSCOPIC  TSH  TROPONIN I   ____________________________________________  EKG  ED ECG REPORT I, Arelia Longest, the attending physician, personally viewed and interpreted this ECG.   Date: 01/15/2017  EKG Time: 1543  Rate: 103  Rhythm: sinus tachycardia  Axis: normal  Intervals:right bundle branch block  ST&T Change: no ST segment elevation or depression. T-wave inversions in leads 3 and aVF as well as V3. no significant change from the previous EKG of 01/01/2017. ____________________________________________  RADIOLOGY  worsening left basilar opacity with moderate-sized pleural effusion. ____________________________________________   PROCEDURES  Procedure(s) performed:   Procedures  Critical Care performed:   ____________________________________________   INITIAL IMPRESSION / ASSESSMENT AND PLAN / ED COURSE  Pertinent labs & imaging results that were available during my care of the patient were reviewed by me and considered in my medical decision making (see chart for details).  DDX: UTI, renal failure, pneumonia, hypothyroidism, delirium  Medical records were reviewed including the patient's last admission was also several weeks ago for a similar complaint.    ----------------------------------------- 8:25 PM on 01/15/2017 -----------------------------------------  Family is now the bedside. Son says the patient is still not back to her baseline mental status. Her heart rate is now 122. Reassuring labs worsening lung findings. Patient will be admitted to the hospital. IV antibiotic regimen initiated. CT ordered as well to make sure that we are not dealing with a mask something other than pneumonia. A splint plan as well as a suspected diagnoses to the family. Signed out to Dr. Cherlynn Kaiser.  ____________________________________________   FINAL CLINICAL IMPRESSION(S) /  ED DIAGNOSES  pneumonia. Altered mental status.    NEW MEDICATIONS STARTED DURING THIS VISIT:  New Prescriptions   No medications on file     Note:  This document was prepared using Dragon voice recognition software and may include unintentional dictation errors.     Myrna Blazer, MD 01/15/17 2026

## 2017-01-15 NOTE — Progress Notes (Addendum)
Pharmacy Antibiotic Note  Jessica [name] is a 71 y.o. female admitted on 01/15/2017 with HCAP/sepsis.  Pharmacy has been consulted for aztreonam and vancomycin dosing.  Plan: 1. Aztreonam 2 gm IV x 1 followed by aztreonam 1 gm IV Q8H. Changed to cefepime by hospitalist. Pt has tolerated Rocephin recently. 2. Vancomycin 1 gm IV x 1 followed in approximately 6 hours (stacked dosing) by vancomycin 1.25 gm IV Q12H, predicted trough 17 mcg/mL. Pharmacy will continue to follow and adjust as needed to maintain trough 15 to 20 mcg/mL.   Vd 54.6 L, Ke 0.072 hr-1, T1/2 9.6 hr  Height:  (177.8 cm) Weight: 409 lb (93 kg) IBW/kg (Calculated) : 68.5  Temp (24hrs), Avg:97.4 F (36.3 C), Min:97.4 F (36.3 C), Max:97.4 F (36.3 C)   Recent Labs Lab 01/15/17 1604 01/15/17 1605  WBC 10.1  --   CREATININE 0.72  --   LATICACIDVEN  --  1.5    Estimated Creatinine Clearance: 82 mL/min (by C-G formula based on SCr of 0.72 mg/dL).    Allergies  Allergen Reactions  . Ambien [Zolpidem Tartrate] Other (See Comments)    Reaction: altered mental status  . Amoxicillin Other (See Comments)    Reaction: blisters in mouth Has patient had a PCN reaction causing immediate rash, facial/tongue/throat swelling, SOB or lightheadedness with hypotension: Yes Has patient had a PCN reaction causing severe rash involving mucus membranes or skin necrosis: No Has patient had a PCN reaction that required hospitalization No Has patient had a PCN reaction occurring within the last 10 years: Yes If all of the above answers are "NO", then may proceed with Cephalosporin use.    Thank you for allowing pharmacy to be a part of this patient's care.  Carola Frost, Pharm.D., BCPS Clinical Pharmacist 01/15/2017 8:24 PM

## 2017-01-15 NOTE — H&P (Signed)
[facility] - [place] [facility]   [name]    MR#:  [id number]  DATE OF BIRTH:  Sep 18, 1945  DATE OF ADMISSION:  01/15/2017  PRIMARY CARE PHYSICIAN: [facility]   REQUESTING/REFERRING PHYSICIAN: Dr. Gladstone Pih  CHIEF COMPLAINT:   Chief Complaint  Patient presents with  . Altered Mental Status    HISTORY OF PRESENT ILLNESS:  Jessica [name]  is a 71 y.o. female with a known history of Hypertension, hyperlipidemia, morbid obesity, spinal stenosis, history of recurrent urinary tract infections who presents to the hospital brought in by her son due to altered mental status. As per the son patient has been more lethargic and not as responsive for the past few days. Patient has had no fever, abdominal pain, nausea, vomiting or any other associated symptoms. Due to her mental status change she was brought to the ER for further evaluation. Emergency room patient underwent a chest x-ray which showed a possible left lower lobe pneumonia with pleural effusion. She subsequently underwent a CT of the chest which did show a left sided pleural effusion along with a large pericardial effusion. Hospitalist services were contacted further treatment and evaluation.  PAST MEDICAL HISTORY:   Past Medical History:  Diagnosis Date  . Chronic pain   . Depression   . HLD (hyperlipidemia)   . HTN (hypertension)   . Hypothyroidism   . Morbid obesity (HCC)   . Recurrent UTI   . Spinal stenosis   . Visual hallucinations     PAST SURGICAL HISTORY:   Past Surgical History:  Procedure Laterality Date  . ABDOMINAL HYSTERECTOMY    . BACK SURGERY    . HEMORROIDECTOMY      SOCIAL HISTORY:   Social History  Substance Use Topics  . Smoking status: Never Smoker  . Smokeless tobacco: Former Neurosurgeon    Types: Snuff  . Alcohol use No    FAMILY HISTORY:   Family History  Problem Relation Age of Onset  . Heart disease Father   .  Deep vein thrombosis Father     DRUG ALLERGIES:   Allergies  Allergen Reactions  . Ambien [Zolpidem Tartrate] Other (See Comments)    Reaction: altered mental status  . Amoxicillin Other (See Comments)    Reaction: blisters in mouth Has patient had a PCN reaction causing immediate rash, facial/tongue/throat swelling, SOB or lightheadedness with hypotension: Yes Has patient had a PCN reaction causing severe rash involving mucus membranes or skin necrosis: No Has patient had a PCN reaction that required hospitalization No Has patient had a PCN reaction occurring within the last 10 years: Yes If all of the above answers are "NO", then may proceed with Cephalosporin use.     REVIEW OF SYSTEMS:   Review of Systems  Constitutional: Negative for fever and weight loss.  HENT: Negative for congestion, nosebleeds and tinnitus.   Eyes: Negative for blurred vision, double vision and redness.  Respiratory: Negative for cough, hemoptysis and shortness of breath.   Cardiovascular: Negative for chest pain, orthopnea, leg swelling and PND.  Gastrointestinal: Negative for abdominal pain, diarrhea, melena, nausea and vomiting.  Genitourinary: Negative for dysuria, hematuria and urgency.  Musculoskeletal: Negative for falls and joint pain.  Neurological: Negative for dizziness, tingling, sensory change, focal weakness, seizures, weakness and headaches.  Endo/Heme/Allergies: Negative for polydipsia. Does not bruise/bleed easily.  Psychiatric/Behavioral: Negative for depression and memory loss. The patient is not nervous/anxious.     MEDICATIONS AT HOME:  Prior to Admission medications   Medication Sig Start Date End Date Taking? Authorizing Provider  acetaminophen (TYLENOL) 325 MG tablet Take 650 mg by mouth every 6 (six) hours as needed for mild pain.   Yes [provider]  albuterol (PROVENTIL HFA;VENTOLIN HFA) 108 (90 Base) MCG/ACT inhaler Inhale 2 puffs into the lungs every 4 (four)  hours as needed for wheezing or shortness of breath.   Yes [provider]  albuterol (PROVENTIL) (2.5 MG/3ML) 0.083% nebulizer solution Take 2.5 mg by nebulization every 6 (six) hours as needed for wheezing or shortness of breath.   Yes [provider]  amlodipine-atorvastatin (CADUET) 2.5-10 MG tablet Take 1 tablet by mouth daily.   Yes [provider]  ARIPiprazole (ABILIFY) 2 MG tablet Take 2 mg by mouth daily. 08/27/16  Yes [provider]  aspirin EC 81 MG tablet Take 81 mg by mouth daily.   Yes [provider]  baclofen (LIORESAL) 20 MG tablet Take 5-20 mg by mouth 3 (three) times daily. 10 mg every morning and 20 mg every evening   Yes [provider]  docusate sodium (COLACE) 100 MG capsule Take 100 mg by mouth 2 (two) times daily.   Yes [provider]  fluconazole (DIFLUCAN) 100 MG tablet Take 100 mg by mouth once a week.   Yes [provider]  fluticasone (FLONASE) 50 MCG/ACT nasal spray Place 2 sprays into both nostrils daily as needed. For congestion   Yes [provider]  furosemide (LASIX) 20 MG tablet Take 20 mg by mouth daily.   Yes [provider]  levothyroxine (SYNTHROID, LEVOTHROID) 150 MCG tablet Take 150 mcg by mouth daily before breakfast.   Yes [provider]  mupirocin ointment (BACTROBAN) 2 % Place 1 application into the nose 2 (two) times daily. Apply to open skin on toes   Yes [provider]  nystatin cream (MYCOSTATIN) Apply 1 application topically 2 (two) times daily. Apply to rash in skin folds   Yes [provider]  polyethylene glycol (MIRALAX / GLYCOLAX) packet Take 17 g by mouth daily.   Yes [provider]  potassium chloride SA (K-DUR,KLOR-CON) 20 MEQ tablet Take 30 mEq by mouth daily.    Yes [provider]  pregabalin (LYRICA) 150 MG capsule Take 150 mg by mouth 3 (three) times daily.   Yes [provider]   senna-docusate (SENOKOT-S) 8.6-50 MG tablet Take 2 tablets by mouth daily.   Yes [provider]  Simethicone 180 MG CAPS Take 2 capsules by mouth 3 (three) times daily.   Yes [provider]  traZODone (DESYREL) 100 MG tablet Take 50 mg by mouth at bedtime.    Yes [provider]  triamcinolone ointment (KENALOG) 0.1 % Apply 1 application topically 2 (two) times daily.   Yes [provider]  Venlafaxine HCl 225 MG TB24 Take 225 mg by mouth daily.   Yes [provider]  vitamin B-12 (CYANOCOBALAMIN) 1000 MCG tablet Take 1,000 mcg by mouth daily.   Yes [provider]  Vitamin D, Ergocalciferol, (DRISDOL) 50000 units CAPS capsule Take 50,000 Units by mouth every 30 (thirty) days.   Yes [provider]  Zinc Oxide 13 % CREA Apply 1 application topically as needed for irritation.   Yes [provider]  hydrocortisone (ANUSOL-HC) 2.5 % rectal cream Place 1 application rectally at bedtime as needed for hemorrhoids or itching.    [provider]  penciclovir (DENAVIR) 1 % cream  Apply 1 application topically 5 (five) times daily as needed. Apply to lip for lesions    [provider]      VITAL SIGNS:  Blood pressure 103/82, pulse (!) 119, temperature (!) 97.4 F (36.3 C), temperature source Oral, resp. rate 17, height  (1.778 m), weight 93 kg (205 lb), SpO2 96 %.  PHYSICAL EXAMINATION:  Physical Exam  GENERAL:  71 y.o.-year-old patient lying in the bed in no acute distress.  EYES: Pupils equal, round, reactive to light and accommodation. No scleral icterus. Extraocular muscles intact.  HEENT: Head atraumatic, normocephalic. Oropharynx and nasopharynx clear. No oropharyngeal erythema, moist oral mucosa  NECK:  Supple, no jugular venous distention. No thyroid enlargement, no tenderness.  LUNGS: Normal breath sounds bilaterally, no wheezing, rales, rhonchi. No use of accessory muscles of respiration.   CARDIOVASCULAR: S1, S2 RRR. No murmurs, rubs, gallops, clicks.  ABDOMEN: Soft, nontender, nondistended. Bowel sounds present. No organomegaly or mass.  EXTREMITIES: +1-2 dependent pedal edema, No cyanosis, or clubbing. + 2 pedal & radial pulses b/l.   NEUROLOGIC: Cranial nerves II through XII are intact. No focal Motor or sensory deficits appreciated b/l. Globally weak and bedbound. PSYCHIATRIC: The patient is alert and oriented x 3.  SKIN: No obvious rash, lesion, or ulcer.   LABORATORY PANEL:   CBC  Recent Labs Lab 01/15/17 1604  WBC 10.1  HGB 13.1  HCT 39.5  PLT 337   ------------------------------------------------------------------------------------------------------------------  Chemistries   Recent Labs Lab 01/15/17 1604  NA 138  K 3.5  CL 97*  CO2 30  GLUCOSE 92  BUN 11  CREATININE 0.72  CALCIUM 8.9  AST 23  ALT 13*  ALKPHOS 136*  BILITOT 0.5   ------------------------------------------------------------------------------------------------------------------  Cardiac Enzymes  Recent Labs Lab 01/15/17 1604  TROPONINI <0.03   ------------------------------------------------------------------------------------------------------------------  RADIOLOGY:  Dg Chest 2 View  Result Date: 01/15/2017 CLINICAL DATA:  71 year old female with a history of confusion EXAM: CHEST  2 VIEW COMPARISON:  01/01/2017 FINDINGS: Cardiomediastinal silhouette unchanged with cardiomegaly. Opacity at the left lung base, with opacity at the posterior base on the lateral view obscuring the costophrenic sulcus. No pneumothorax. No displaced fracture. IMPRESSION: Worsening left basilar opacity, with moderate sized pleural effusion. Given the changes on prior chest x-ray this may reflect a parapneumonic effusion with left lower lobe pneumonia. Electronically Signed   By: Gilmer Mor D.O.   On: 01/15/2017 17:10   Ct Chest W Contrast  Result Date: 01/15/2017 CLINICAL DATA:  Cough.   Altered mental status. EXAM: CT CHEST WITH CONTRAST TECHNIQUE: Multidetector CT imaging of the chest was performed during intravenous contrast administration. CONTRAST:  75mL ISOVUE-300 IOPAMIDOL (ISOVUE-300) INJECTION 61% COMPARISON:  Two-view chest x-ray from the same day. FINDINGS: Cardiovascular: A large pericardial effusion is present. The heart size is normal. Coronary artery calcifications are noted. Atherosclerotic changes are noted at the aortic arch and great vessel origins there of the present descending aorta. There is no aneurysm or focal stenosis. Mediastinum/Nodes: No significant adenopathy is present. Gas is noted within the esophagus, likely within normal limits Lungs/Pleura: Left greater than right pleural effusions are present. There is volume loss at the lung bases bilaterally. No significant consolidative airspace disease is present. Minimal atelectasis is noted posteriorly in the left upper lobe as well. No significant nodule or mass lesion is present. Upper Abdomen: Limited imaging the upper abdomen demonstrates atherosclerotic changes at the origins of the celiac and superior mesenteric artery is without significant stenosis. Solid organs are within normal  limits. Musculoskeletal: No focal lytic or blastic lesions are present. Mild rightward curvature is present in the thoracic spine. No focal lytic or blastic lesions are present. IMPRESSION: 1. Large pericardial effusion. 2. Coronary artery calcifications. 3. Moderate left pleural effusion with near total collapse of the left lower lobe. 4. Small right pleural effusion with mild right basilar atelectasis. 5.  Aortic Atherosclerosis (ICD10-I70.0). Critical Value/emergent results were called by telephone at the time of interpretation on 01/15/2017 at 8:49 pm to Dr. Gladstone Pih , who verbally acknowledged these results. Electronically Signed   By: Marin Roberts M.D.   On: 01/15/2017 20:51     IMPRESSION AND PLAN:   71 year old  female with past history of recurrent urinary tract infection, morbid obesity, hypertension, hypothyroidism, spinal stenosis who presents to the hospital due to altered mental status.  1. Altered mental status-etiology unclear but suspected to be secondary to metabolic encephalopathy from suspected pneumonia. -Continue treatment for underlying pneumonia with IV antibiotics and follow mental status. Upon my evaluation of patient's mental status it seemed close to her baseline.  2. Pneumonia-suspected source of patient's altered mental status. Patient clinically denies any shortness of breath fever or any other source respiratory symptoms. CT chest and chest x-ray shows a total collapse of her left lower lobe with a pleural effusion. I'll empirically treat her with vancomycin, cefepime. Follow cultures.  3. Pleural effusion-patient noted to have a large pleural effusion on the left side. I will order an ultrasound-guided diagnostic/therapeutic thoracentesis. Clinically patient is afebrile and Hemodynamically stable and not showing any signs of sepsis/empyema.  4. Pericardial effusion-this was incidentally noted on the CT chest. Patient is tachycardic but is not hypotensive or showing signs of temp and on physiology. I will get a two-dimensional echocardiogram to further evaluate this and if needed consider a cardiology consult.  5. Hypothyroidism-continue Synthroid.  6. Depression-continue Abilify, Effexor..  7. Essential hypertension-continue Norvasc.  8. Hyperlipidemia-continue atorvastatin.  9. Neuropathy-continue Lyrica.  All the records are reviewed and case discussed with ED provider. Management plans discussed with the patient, family and they are in agreement.  CODE STATUS: Full code  TOTAL TIME TAKING CARE OF THIS PATIENT: 45 minutes.    Houston Siren M.D on 01/15/2017 at 9:11 PM  Between 7am to 6pm - Pager - [phone]  After 6pm go to [email or web address] - password [email or web address]  [facility]  [place] [facility]  Office  [phone]  CC: Primary care physician; [facility]

## 2017-01-16 ENCOUNTER — Inpatient Hospital Stay (HOSPITAL_COMMUNITY)
Admit: 2017-01-16 | Discharge: 2017-01-16 | Disposition: A | Discharge status: Home / Self Care | Payer: Medicare Other | Attending: Specialist | Admitting: Specialist

## 2017-01-16 DIAGNOSIS — I313 Pericardial effusion (noninflammatory): Secondary | ICD-10-CM

## 2017-01-16 DIAGNOSIS — A419 Sepsis, unspecified organism: Secondary | ICD-10-CM

## 2017-01-16 LAB — BASIC METABOLIC PANEL
Anion gap: 8 (ref 5–15)
BUN: 11 mg/dL (ref 6–20)
CO2: 29 mmol/L (ref 22–32)
Calcium: 8.4 mg/dL — ABNORMAL LOW (ref 8.9–10.3)
Chloride: 99 mmol/L — ABNORMAL LOW (ref 101–111)
Creatinine, Ser: 0.71 mg/dL (ref 0.44–1.00)
GFR calc Af Amer: >60 mL/min (ref >60)
GFR calc non Af Amer: >60 mL/min (ref >60)
Glucose, Bld: 95 mg/dL (ref 65–99)
Potassium: 3 mmol/L — ABNORMAL LOW (ref 3.5–5.1)
Sodium: 136 mmol/L (ref 135–145)

## 2017-01-16 LAB — CBC
HCT: 34.8 % — ABNORMAL LOW (ref 35.0–47.0)
Hemoglobin: 11.7 g/dL — ABNORMAL LOW (ref 12.0–16.0)
MCH: 26.4 pg (ref 26.0–34.0)
MCHC: 33.7 g/dL (ref 32.0–36.0)
MCV: 78.3 fL — ABNORMAL LOW (ref 80.0–100.0)
Platelets: 311 10*3/uL (ref 150–440)
RBC: 4.45 MIL/uL (ref 3.80–5.20)
RDW: 15.2 % — ABNORMAL HIGH (ref 11.5–14.5)
WBC: 9.4 10*3/uL (ref 3.6–11.0)

## 2017-01-16 LAB — ECHOCARDIOGRAM COMPLETE
Height: 70 in
Weight: 3280 oz

## 2017-01-16 MED ORDER — POTASSIUM CHLORIDE CRYS ER 20 MEQ PO TBCR
40.0000 meq | EXTENDED_RELEASE_TABLET | Freq: Two times a day (BID) | ORAL | Status: AC
  Administered 2017-01-16: 40 meq via ORAL
  Filled 2017-01-16 (×2): qty 2

## 2017-01-16 MED ORDER — PERFLUTREN LIPID MICROSPHERE
1.0000 mL | INTRAVENOUS | Status: AC | PRN
Start: 2017-01-16 — End: 2017-01-16
  Administered 2017-01-16: 7 mL via INTRAVENOUS

## 2017-01-16 NOTE — Consult Note (Signed)
Jessica [name] is a 71 y.o. female  [id number]  Primary Cardiologist: Dr. Adrian Blackwater   Reason for Consultation: Pericardia effusion  HPI: 71yo female with a known history of hypertension, hyperlipidemia, morbid obesity, spinal stenosis, and recurrent UTIs presented to ER for altered mental status. Chest xray revealed possible LLL pneumonia with plural effusion. Chest CT subsequently showed large pericardial effusion.   Review of Systems: Feeling better, denies chest pain or shortness of breath. Son reports pt's mental status has improved and is back to baseline.    Past Medical History:  Diagnosis Date  . Chronic pain   . Depression   . HLD (hyperlipidemia)   . HTN (hypertension)   . Hypothyroidism   . Morbid obesity (HCC)   . Recurrent UTI   . Spinal stenosis   . Visual hallucinations     Medications Prior to Admission  Medication Sig Dispense Refill  . acetaminophen (TYLENOL) 325 MG tablet Take 650 mg by mouth every 6 (six) hours as needed for mild pain.    Marland Kitchen albuterol (PROVENTIL HFA;VENTOLIN HFA) 108 (90 Base) MCG/ACT inhaler Inhale 2 puffs into the lungs every 4 (four) hours as needed for wheezing or shortness of breath.    Marland Kitchen albuterol (PROVENTIL) (2.5 MG/3ML) 0.083% nebulizer solution Take 2.5 mg by nebulization every 6 (six) hours as needed for wheezing or shortness of breath.    Marland Kitchen amlodipine-atorvastatin (CADUET) 2.5-10 MG tablet Take 1 tablet by mouth daily.    . ARIPiprazole (ABILIFY) 2 MG tablet Take 2 mg by mouth daily.  1  . aspirin EC 81 MG tablet Take 81 mg by mouth daily.    . baclofen (LIORESAL) 20 MG tablet Take 5-20 mg by mouth 3 (three) times daily. 10 mg every morning and 20 mg every evening    . docusate sodium (COLACE) 100 MG capsule Take 100 mg by mouth 2 (two) times daily.    . fluconazole (DIFLUCAN) 100 MG tablet Take 100 mg by mouth once a week.    . fluticasone (FLONASE) 50 MCG/ACT nasal spray Place 2 sprays into both nostrils daily as needed.  For congestion    . furosemide (LASIX) 20 MG tablet Take 20 mg by mouth daily.    Marland Kitchen levothyroxine (SYNTHROID, LEVOTHROID) 150 MCG tablet Take 150 mcg by mouth daily before breakfast.    . mupirocin ointment (BACTROBAN) 2 % Place 1 application into the nose 2 (two) times daily. Apply to open skin on toes    . nystatin cream (MYCOSTATIN) Apply 1 application topically 2 (two) times daily. Apply to rash in skin folds    . polyethylene glycol (MIRALAX / GLYCOLAX) packet Take 17 g by mouth daily.    . potassium chloride SA (K-DUR,KLOR-CON) 20 MEQ tablet Take 30 mEq by mouth daily.     . pregabalin (LYRICA) 150 MG capsule Take 150 mg by mouth 3 (three) times daily.    Marland Kitchen senna-docusate (SENOKOT-S) 8.6-50 MG tablet Take 2 tablets by mouth daily.    . Simethicone 180 MG CAPS Take 2 capsules by mouth 3 (three) times daily.    . traZODone (DESYREL) 100 MG tablet Take 50 mg by mouth at bedtime.     . triamcinolone ointment (KENALOG) 0.1 % Apply 1 application topically 2 (two) times daily.    . Venlafaxine HCl 225 MG TB24 Take 225 mg by mouth daily.    . vitamin B-12 (CYANOCOBALAMIN) 1000 MCG tablet Take 1,000 mcg by mouth daily.    . Vitamin  D, Ergocalciferol, (DRISDOL) 50000 units CAPS capsule Take 50,000 Units by mouth every 30 (thirty) days.    . Zinc Oxide 13 % CREA Apply 1 application topically as needed for irritation.    . hydrocortisone (ANUSOL-HC) 2.5 % rectal cream Place 1 application rectally at bedtime as needed for hemorrhoids or itching.    . penciclovir (DENAVIR) 1 % cream Apply 1 application topically 5 (five) times daily as needed. Apply to lip for lesions       . amLODipine  2.5 mg Oral Daily   And  . atorvastatin  10 mg Oral Daily  . ARIPiprazole  2 mg Oral Daily  . aspirin EC  81 mg Oral Daily  . baclofen  5-20 mg Oral TID  . docusate sodium  100 mg Oral BID  . enoxaparin (LOVENOX) injection  40 mg Subcutaneous Q24H  . fluconazole  200 mg Oral Daily  . furosemide  20 mg Oral  Daily  . levothyroxine  150 mcg Oral QAC breakfast  . mupirocin ointment  1 application Nasal BID  . nystatin cream  1 application Topical BID  . polyethylene glycol  17 g Oral Daily  . pregabalin  150 mg Oral TID  . senna-docusate  2 tablet Oral Daily  . traZODone  50 mg Oral QHS  . venlafaxine XR  225 mg Oral Daily  . vitamin B-12  1,000 mcg Oral Daily  . Vitamin D (Ergocalciferol)  50,000 Units Oral Q30 days    Infusions: . ceFEPime (MAXIPIME) IV Stopped (01/16/17 0855)  . vancomycin Stopped (01/16/17 0521)  . vancomycin      Allergies  Allergen Reactions  . Ambien [Zolpidem Tartrate] Other (See Comments)    Reaction: altered mental status  . Amoxicillin Other (See Comments)    Reaction: blisters in mouth Has patient had a PCN reaction causing immediate rash, facial/tongue/throat swelling, SOB or lightheadedness with hypotension: Yes Has patient had a PCN reaction causing severe rash involving mucus membranes or skin necrosis: No Has patient had a PCN reaction that required hospitalization No Has patient had a PCN reaction occurring within the last 10 years: Yes If all of the above answers are "NO", then may proceed with Cephalosporin use.     Social History   Social History  . Marital status: Married    Spouse name: N/A  . Number of children: N/A  . Years of education: N/A   Occupational History  . Not on file.   Social History Main Topics  . Smoking status: Never Smoker  . Smokeless tobacco: Former Neurosurgeon    Types: Snuff  . Alcohol use No  . Drug use: No  . Sexual activity: Not on file   Other Topics Concern  . Not on file   Social History Narrative  . No narrative on file    Family History  Problem Relation Age of Onset  . Heart disease Father   . Deep vein thrombosis Father     PHYSICAL EXAM: Vitals:   01/15/17 2225 01/16/17 0811  BP: 115/63 (!) 114/59  Pulse: (!) 119 (!) 103  Resp: 18 17  Temp: 97.6 F (36.4 C) 97.9 F (36.6 C)  SpO2:  95% 94%     Intake/Output Summary (Last 24 hours) at 01/16/17 0955 Last data filed at 01/16/17 0500  Gross per 24 hour  Intake              470 ml  Output  300 ml  Net              170 ml    General:  Well appearing. No respiratory difficulty HEENT: normal Neck: supple. no JVD. Carotids 2+ bilat; no bruits. No lymphadenopathy or thryomegaly appreciated. Cor: Tachycardia, no murmurs Lungs: clear Abdomen: soft, nontender, nondistended. No hepatosplenomegaly. No bruits or masses. Good bowel sounds. Extremities: no cyanosis, clubbing, rash, edema Neuro: alert & oriented x 3, cranial nerves grossly intact. moves all 4 extremities w/o difficulty. Affect pleasant.  ECG: Sinus tachycardia 118bpm  Results for orders placed or performed during the hospital encounter of 01/15/17 (from the past 24 hour(s))  CBC with Differential     Status: Abnormal   Collection Time: 01/15/17  4:04 PM  Result Value Ref Range   WBC 10.1 3.6 - 11.0 K/uL   RBC 4.96 3.80 - 5.20 MIL/uL   Hemoglobin 13.1 12.0 - 16.0 g/dL   HCT 40.9 81.1 - 91.4 %   MCV 79.6 (L) 80.0 - 100.0 fL   MCH 26.4 26.0 - 34.0 pg   MCHC 33.2 32.0 - 36.0 g/dL   RDW 78.2 (H) 95.6 - 21.3 %   Platelets 337 150 - 440 K/uL   Neutrophils Relative % 72 %   Neutro Abs 7.2 (H) 1.4 - 6.5 K/uL   Lymphocytes Relative 17 %   Lymphs Abs 1.8 1.0 - 3.6 K/uL   Monocytes Relative 8 %   Monocytes Absolute 0.8 0.2 - 0.9 K/uL   Eosinophils Relative 2 %   Eosinophils Absolute 0.2 0 - 0.7 K/uL   Basophils Relative 1 %   Basophils Absolute 0.1 0 - 0.1 K/uL  Comprehensive metabolic panel     Status: Abnormal   Collection Time: 01/15/17  4:04 PM  Result Value Ref Range   Sodium 138 135 - 145 mmol/L   Potassium 3.5 3.5 - 5.1 mmol/L   Chloride 97 (L) 101 - 111 mmol/L   CO2 30 22 - 32 mmol/L   Glucose, Bld 92 65 - 99 mg/dL   BUN 11 6 - 20 mg/dL   Creatinine, Ser 0.86 0.44 - 1.00 mg/dL   Calcium 8.9 8.9 - 57.8 mg/dL   Total Protein 6.9 6.5  - 8.1 g/dL   Albumin 2.5 (L) 3.5 - 5.0 g/dL   AST 23 15 - 41 U/L   ALT 13 (L) 14 - 54 U/L   Alkaline Phosphatase 136 (H) 38 - 126 U/L   Total Bilirubin 0.5 0.3 - 1.2 mg/dL   GFR calc non Af Amer >60 >60 mL/min   GFR calc Af Amer >60 >60 mL/min   Anion gap 11 5 - 15  TSH     Status: None   Collection Time: 01/15/17  4:04 PM  Result Value Ref Range   TSH 0.753 0.350 - 4.500 uIU/mL  Troponin I     Status: None   Collection Time: 01/15/17  4:04 PM  Result Value Ref Range   Troponin I <0.03 <0.03 ng/mL  Lactic acid, plasma     Status: None   Collection Time: 01/15/17  4:05 PM  Result Value Ref Range   Lactic Acid, Venous 1.5 0.5 - 1.9 mmol/L  Basic metabolic panel     Status: Abnormal   Collection Time: 01/16/17  4:58 AM  Result Value Ref Range   Sodium 136 135 - 145 mmol/L   Potassium 3.0 (L) 3.5 - 5.1 mmol/L   Chloride 99 (L) 101 - 111 mmol/L   CO2  29 22 - 32 mmol/L   Glucose, Bld 95 65 - 99 mg/dL   BUN 11 6 - 20 mg/dL   Creatinine, Ser 1.61 0.44 - 1.00 mg/dL   Calcium 8.4 (L) 8.9 - 10.3 mg/dL   GFR calc non Af Amer >60 >60 mL/min   GFR calc Af Amer >60 >60 mL/min   Anion gap 8 5 - 15  CBC     Status: Abnormal   Collection Time: 01/16/17  4:58 AM  Result Value Ref Range   WBC 9.4 3.6 - 11.0 K/uL   RBC 4.45 3.80 - 5.20 MIL/uL   Hemoglobin 11.7 (L) 12.0 - 16.0 g/dL   HCT 09.6 (L) 04.5 - 40.9 %   MCV 78.3 (L) 80.0 - 100.0 fL   MCH 26.4 26.0 - 34.0 pg   MCHC 33.7 32.0 - 36.0 g/dL   RDW 81.1 (H) 91.4 - 78.2 %   Platelets 311 150 - 440 K/uL   Dg Chest 2 View  Result Date: 01/15/2017 CLINICAL DATA:  71 year old female with a history of confusion EXAM: CHEST  2 VIEW COMPARISON:  01/01/2017 FINDINGS: Cardiomediastinal silhouette unchanged with cardiomegaly. Opacity at the left lung base, with opacity at the posterior base on the lateral view obscuring the costophrenic sulcus. No pneumothorax. No displaced fracture. IMPRESSION: Worsening left basilar opacity, with moderate sized  pleural effusion. Given the changes on prior chest x-ray this may reflect a parapneumonic effusion with left lower lobe pneumonia. Electronically Signed   By: Gilmer Mor D.O.   On: 01/15/2017 17:10   Ct Chest W Contrast  Result Date: 01/15/2017 CLINICAL DATA:  Cough.  Altered mental status. EXAM: CT CHEST WITH CONTRAST TECHNIQUE: Multidetector CT imaging of the chest was performed during intravenous contrast administration. CONTRAST:  75mL ISOVUE-300 IOPAMIDOL (ISOVUE-300) INJECTION 61% COMPARISON:  Two-view chest x-ray from the same day. FINDINGS: Cardiovascular: A large pericardial effusion is present. The heart size is normal. Coronary artery calcifications are noted. Atherosclerotic changes are noted at the aortic arch and great vessel origins there of the present descending aorta. There is no aneurysm or focal stenosis. Mediastinum/Nodes: No significant adenopathy is present. Gas is noted within the esophagus, likely within normal limits Lungs/Pleura: Left greater than right pleural effusions are present. There is volume loss at the lung bases bilaterally. No significant consolidative airspace disease is present. Minimal atelectasis is noted posteriorly in the left upper lobe as well. No significant nodule or mass lesion is present. Upper Abdomen: Limited imaging the upper abdomen demonstrates atherosclerotic changes at the origins of the celiac and superior mesenteric artery is without significant stenosis. Solid organs are within normal limits. Musculoskeletal: No focal lytic or blastic lesions are present. Mild rightward curvature is present in the thoracic spine. No focal lytic or blastic lesions are present. IMPRESSION: 1. Large pericardial effusion. 2. Coronary artery calcifications. 3. Moderate left pleural effusion with near total collapse of the left lower lobe. 4. Small right pleural effusion with mild right basilar atelectasis. 5.  Aortic Atherosclerosis (ICD10-I70.0). Critical Value/emergent  results were called by telephone at the time of interpretation on 01/15/2017 at 8:49 pm to Dr. Gladstone Pih , who verbally acknowledged these results. Electronically Signed   By: Marin Roberts M.D.   On: 01/15/2017 20:51     ASSESSMENT AND PLAN: Pericardial effusion noted on chest CT, negative troponin. Echo is pending. Blood pressure is low-normal but stable, mental status improved with antibiotic treatment. Sinus tachycardia noted.   Potassium 3.0, replacement ordered.  Recommend continued antibiotics for pneumonia and will review echo when available. Will continue to follow.  Caroleen Hamman

## 2017-01-16 NOTE — Progress Notes (Signed)
Patient has rested quietly today with family at bedside. No complaints. Echo done today. Awaiting call from radiology to determine if thoracentesis will occur today - pt in no acute distress. Dr. Amado Coe aware.

## 2017-01-16 NOTE — Progress Notes (Signed)
*  PRELIMINARY RESULTS* Echocardiogram 2D Echocardiogram has been performed. Definity IV Contrast used on this study.  Garrel Ridgel Maricella Filyaw 01/16/2017, 11:10 AM

## 2017-01-16 NOTE — Progress Notes (Signed)
[facility] - [place] at [facility]   [name]    MR#:  [id number]  DATE OF BIRTH:  Jul 29, 1945  SUBJECTIVE:  CHIEF COMPLAINT:  Patient is a poor historian and answers  few questions,  Agreeable to thoracentesis and son is at bedside. Denies any shortness of breath while resting  REVIEW OF SYSTEMS:  CONSTITUTIONAL: No fever, Reporting fatigue or weakness.  EYES: No blurred or double vision.  EARS, NOSE, AND THROAT: No tinnitus or ear pain.  RESPIRATORY: No cough, reports exertional shortness of breath, denies wheezing or hemoptysis.  CARDIOVASCULAR: No chest pain, orthopnea, edema.  GASTROINTESTINAL: No nausea, vomiting, diarrhea or abdominal pain.  GENITOURINARY: No dysuria, hematuria.  ENDOCRINE: No polyuria, nocturia,  HEMATOLOGY: No anemia, easy bruising or bleeding SKIN: No rash or lesion. MUSCULOSKELETAL: No joint pain or arthritis.   NEUROLOGIC: No tingling, numbness, weakness.  PSYCHIATRY: No anxiety or depression.   DRUG ALLERGIES:   Allergies  Allergen Reactions  . Ambien [Zolpidem Tartrate] Other (See Comments)    Reaction: altered mental status  . Amoxicillin Other (See Comments)    Reaction: blisters in mouth Has patient had a PCN reaction causing immediate rash, facial/tongue/throat swelling, SOB or lightheadedness with hypotension: Yes Has patient had a PCN reaction causing severe rash involving mucus membranes or skin necrosis: No Has patient had a PCN reaction that required hospitalization No Has patient had a PCN reaction occurring within the last 10 years: Yes If all of the above answers are "NO", then may proceed with Cephalosporin use.     VITALS:  Blood pressure (!) 114/59, pulse (!) 103, temperature 97.9 F (36.6 C), temperature source Oral, resp. rate 17, height  (1.778 m), weight 93 kg (205 lb), SpO2 94 %.  PHYSICAL EXAMINATION:  GENERAL:  71 y.o.-year-old patient lying in the bed with no acute  distress.  EYES: Pupils equal, round, reactive to light and accommodation. No scleral icterus. Extraocular muscles intact.  HEENT: Head atraumatic, normocephalic. Oropharynx and nasopharynx clear.  NECK:  Supple, no jugular venous distention. No thyroid enlargement, no tenderness.  LUNGS: Diminished breath sounds on the left lower lobe, moderate breath sounds bilaterally, no wheezing, rales,rhonchi or crepitation. No use of accessory muscles of respiration.  CARDIOVASCULAR: S1, S2 normal. No murmurs, rubs, or gallops.  ABDOMEN: Soft, nontender, nondistended. Bowel sounds present. No organomegaly or mass.  EXTREMITIES: 1+ dependent pedal edema, no cyanosis, or clubbing.  NEUROLOGIC: Cranial nerves II through XII are intact. Muscle strength 5/5 in all extremities. Sensation intact. Gait not checked.  PSYCHIATRIC: The patient is alert and oriented x 3.  SKIN: No obvious rash, lesion, or ulcer.    LABORATORY PANEL:   CBC  Recent Labs Lab 01/16/17 0458  WBC 9.4  HGB 11.7*  HCT 34.8*  PLT 311   ------------------------------------------------------------------------------------------------------------------  Chemistries   Recent Labs Lab 01/15/17 1604 01/16/17 0458  NA 138 136  K 3.5 3.0*  CL 97* 99*  CO2 30 29  GLUCOSE 92 95  BUN 11 11  CREATININE 0.72 0.71  CALCIUM 8.9 8.4*  AST 23  --   ALT 13*  --   ALKPHOS 136*  --   BILITOT 0.5  --    ------------------------------------------------------------------------------------------------------------------  Cardiac Enzymes  Recent Labs Lab 01/15/17 1604  TROPONINI <0.03   ------------------------------------------------------------------------------------------------------------------  RADIOLOGY:  Dg Chest 2 View  Result Date: 01/15/2017 CLINICAL DATA:  71 year old female with a history of confusion EXAM: CHEST  2 VIEW COMPARISON:  01/01/2017 FINDINGS:  Cardiomediastinal silhouette unchanged with cardiomegaly.  Opacity at the left lung base, with opacity at the posterior base on the lateral view obscuring the costophrenic sulcus. No pneumothorax. No displaced fracture. IMPRESSION: Worsening left basilar opacity, with moderate sized pleural effusion. Given the changes on prior chest x-ray this may reflect a parapneumonic effusion with left lower lobe pneumonia. Electronically Signed   By: Gilmer Mor D.O.   On: 01/15/2017 17:10   Ct Chest W Contrast  Result Date: 01/15/2017 CLINICAL DATA:  Cough.  Altered mental status. EXAM: CT CHEST WITH CONTRAST TECHNIQUE: Multidetector CT imaging of the chest was performed during intravenous contrast administration. CONTRAST:  75mL ISOVUE-300 IOPAMIDOL (ISOVUE-300) INJECTION 61% COMPARISON:  Two-view chest x-ray from the same day. FINDINGS: Cardiovascular: A large pericardial effusion is present. The heart size is normal. Coronary artery calcifications are noted. Atherosclerotic changes are noted at the aortic arch and great vessel origins there of the present descending aorta. There is no aneurysm or focal stenosis. Mediastinum/Nodes: No significant adenopathy is present. Gas is noted within the esophagus, likely within normal limits Lungs/Pleura: Left greater than right pleural effusions are present. There is volume loss at the lung bases bilaterally. No significant consolidative airspace disease is present. Minimal atelectasis is noted posteriorly in the left upper lobe as well. No significant nodule or mass lesion is present. Upper Abdomen: Limited imaging the upper abdomen demonstrates atherosclerotic changes at the origins of the celiac and superior mesenteric artery is without significant stenosis. Solid organs are within normal limits. Musculoskeletal: No focal lytic or blastic lesions are present. Mild rightward curvature is present in the thoracic spine. No focal lytic or blastic lesions are present. IMPRESSION: 1. Large pericardial effusion. 2. Coronary artery  calcifications. 3. Moderate left pleural effusion with near total collapse of the left lower lobe. 4. Small right pleural effusion with mild right basilar atelectasis. 5.  Aortic Atherosclerosis (ICD10-I70.0). Critical Value/emergent results were called by telephone at the time of interpretation on 01/15/2017 at 8:49 pm to Dr. Gladstone Pih , who verbally acknowledged these results. Electronically Signed   By: Marin Roberts M.D.   On: 01/15/2017 20:51    EKG:   Orders placed or performed during the hospital encounter of 01/15/17  . ED EKG  . ED EKG  . EKG 12-Lead  . EKG 12-Lead    ASSESSMENT AND PLAN:    1. Altered mental status-etiology unclear but suspected to be secondary to metabolic encephalopathy from suspected pneumonia. -Continue treatment for underlying pneumonia with IV antibiotics and follow mental status.  2. Pneumonia-suspected source of patient's altered mental status.  Patient clinically denies any shortness of breath fever or any other source respiratory symptoms.  CT chest and chest x-ray shows a total collapse of her left lower lobe with a pleural effusion.  continue empirically  vancomycin, cefepime. Follow cultures.  3. Pleural effusion-patient noted to have a large pleural effusion on the left sideOn CT chest  Pending ultrasound-guided diagnostic/therapeutic thoracentesis. Discussed with interventional radiology and RN Raynelle Fanning Dr. Archer Asa is on call at [phone]  Clinically patient is afebrile and Hemodynamically stable and not showing any signs of sepsis/empyema.  4. Pericardial effusion-this was incidentally noted on the CT chest.  Patient is tachycardic but is not hypotensive or showing signs of temp and on physiology.  Pending two-dimensional echocardiogram  Cardiology consult is placed  5. Hypothyroidism-continue Synthroid.  6. Depression-continue Abilify, Effexor..  7. Essential hypertension-continue Norvasc.  8.  Hyperlipidemia-continue atorvastatin.  9. Neuropathy-continue Lyrica.    All  the records are reviewed and case discussed with Care Management/Social Workerr. Management plans discussed with the patient, son at bedside and they are in agreement.  CODE STATUS: fc   TOTAL TIME TAKING CARE OF THIS PATIENT: 36  minutes.   POSSIBLE D/C IN 2-3 DAYS, DEPENDING ON CLINICAL CONDITION.  Note: This dictation was prepared with Dragon dictation along with smaller phrase technology. Any transcriptional errors that result from this process are unintentional.   Ramonita Lab M.D on 01/16/2017 at 12:27 PM  Between 7am to 6pm - Pager - [phone] After 6pm go to [email or web address] - password [email or web address] [facility]  [place] [facility]  Office  [phone]  CC: Primary care physician; Center, [facility]

## 2017-01-17 LAB — POTASSIUM: Potassium: 3.4 mmol/L — ABNORMAL LOW (ref 3.5–5.1)

## 2017-01-17 LAB — VANCOMYCIN, TROUGH: Vancomycin Tr: 41 ug/mL (ref 15–20)

## 2017-01-17 LAB — MAGNESIUM: Magnesium: 1.8 mg/dL (ref 1.7–2.4)

## 2017-01-17 NOTE — Progress Notes (Addendum)
Pharmacy Antibiotic Note  NATAHLIA HOGGARD is a 71 y.o. female admitted on 01/15/2017 with HCAP/sepsis.  Pharmacy has been consulted for cefepime and vancomycin dosing.  Plan: 10/7 1438 vancomycin level reported as 41 mcg/mL. RN confirms lab was drawn before she started the infusion, but she doesn't know if the line was flushed or if level was drawn from line. Will discontinue order for now and recheck vanc level tomorrow with AM labs.   ADD: Vancomycin Random level 28. Will recheck Vancomycin random level with am labs on 10/9.   Height:  (177.8 cm) Weight: 205 lb (93 kg) IBW/kg (Calculated) : 68.5  Temp (24hrs), Avg:98.2 F (36.8 C), Min:97.9 F (36.6 C), Max:98.5 F (36.9 C)   Recent Labs Lab 01/15/17 1604 01/15/17 1605 01/16/17 0458 01/17/17 1438  WBC 10.1  --  9.4  --   CREATININE 0.72  --  0.71  --   LATICACIDVEN  --  1.5  --   --   VANCOTROUGH  --   --   --  41*    Estimated Creatinine Clearance: 82 mL/min (by C-G formula based on SCr of 0.71 mg/dL).    Allergies  Allergen Reactions  . Ambien [Zolpidem Tartrate] Other (See Comments)    Reaction: altered mental status  . Amoxicillin Other (See Comments)    Reaction: blisters in mouth Has patient had a PCN reaction causing immediate rash, facial/tongue/throat swelling, SOB or lightheadedness with hypotension: Yes Has patient had a PCN reaction causing severe rash involving mucus membranes or skin necrosis: No Has patient had a PCN reaction that required hospitalization No Has patient had a PCN reaction occurring within the last 10 years: Yes If all of the above answers are "NO", then may proceed with Cephalosporin use.    Thank you for allowing pharmacy to be a part of this patient's care.  Demetrius Charity, PharmD  Clinical Pharmacist 01/17/2017 4:12 PM

## 2017-01-17 NOTE — Progress Notes (Signed)
SUBJECTIVE: Pt is feeling well and resting comfortably.   Vitals:   01/16/17 0811 01/16/17 1457 01/16/17 1926 01/17/17 0747  BP: (!) 114/59 119/62 129/69 137/81  Pulse: (!) 103 92 (!) 117 91  Resp: Temp: 97.9 F (36.6 C) 98.3 F (36.8 C) 98.3 F (36.8 C) 98.5 F (36.9 C)  TempSrc: Oral Oral Oral Oral  SpO2: 94% 96% 91% 94%  Weight:      Height:        Intake/Output Summary (Last 24 hours) at 01/17/17 0752 Last data filed at 01/17/17 0444  Gross per 24 hour  Intake              710 ml  Output             2250 ml  Net            -1540 ml    LABS: Basic Metabolic Panel:  Recent Labs  16/10/96 1604 01/16/17 0458  NA 138 136  K 3.5 3.0*  CL 97* 99*  CO2 30 29  GLUCOSE 92 95  BUN 11 11  CREATININE 0.72 0.71  CALCIUM 8.9 8.4*   Liver Function Tests:  Recent Labs  01/15/17 1604  AST 23  ALT 13*  ALKPHOS 136*  BILITOT 0.5  PROT 6.9  ALBUMIN 2.5*   No results for input(s): LIPASE, AMYLASE in the last 72 hours. CBC:  Recent Labs  01/15/17 1604 01/16/17 0458  WBC 10.1 9.4  NEUTROABS 7.2*  --   HGB 13.1 11.7*  HCT 39.5 34.8*  MCV 79.6* 78.3*  PLT 337 311   Cardiac Enzymes:  Recent Labs  01/15/17 1604  TROPONINI <0.03   BNP: Invalid input(s): POCBNP D-Dimer: No results for input(s): DDIMER in the last 72 hours. Hemoglobin A1C: No results for input(s): HGBA1C in the last 72 hours. Fasting Lipid Panel: No results for input(s): CHOL, HDL, LDLCALC, TRIG, CHOLHDL, LDLDIRECT in the last 72 hours. Thyroid Function Tests:  Recent Labs  01/15/17 1604  TSH 0.753   Anemia Panel: No results for input(s): VITAMINB12, FOLATE, FERRITIN, TIBC, IRON, RETICCTPCT in the last 72 hours.   PHYSICAL EXAM General: Well developed, well nourished, in no acute distress HEENT:  Normocephalic and atramatic Neck:  No JVD.  Lungs: Clear bilaterally to auscultation and percussion. Heart: HRRR . Normal S1 and S2 without gallops or murmurs.  Abdomen:  Bowel sounds are positive, abdomen soft and non-tender  Msk:  Back normal, normal gait. Normal strength and tone for age. Extremities: No clubbing, cyanosis or edema.   Neuro: Alert and oriented X 3. Psych:  Good affect, responds appropriately  TELEMETRY: NSR 90bpm  ASSESSMENT AND PLAN: Pneumonia with moderate to large pericardial effusion was identified, as noted on prior chest CT. There are equivocal findings of tamponade physiology. Heart rate has improved today, blood pressure and oxygen status are stable. Advise continued observation.   Active Problems:   Pneumonia    Caroleen Hamman, NP-C 01/17/2017 7:52 AM

## 2017-01-17 NOTE — Progress Notes (Signed)
Pt alert but forgetful. Resting in bed. Son at bedside. IV antibiotic infusing. New foam sacral dressing placed. External catheter in place. No complaints at this time. Will continue to monitor.

## 2017-01-17 NOTE — Progress Notes (Addendum)
[facility] - [place] at [facility]   [name]    MR#:  [id number]  DATE OF BIRTH:  1945-06-25  SUBJECTIVE:  CHIEF COMPLAINT:  Patient is a poor historian and answers  few questions, According to the son at bedside patient is at her baseline Agreeable to thoracentesis and son is at bedside. Denies any shortness of breath while resting  REVIEW OF SYSTEMS:  CONSTITUTIONAL: No fever, Reporting fatigue or weakness.  EYES: No blurred or double vision.  EARS, NOSE, AND THROAT: No tinnitus or ear pain.  RESPIRATORY: No cough, reports exertional shortness of breath, denies wheezing or hemoptysis.  CARDIOVASCULAR: No chest pain, orthopnea, edema.  GASTROINTESTINAL: No nausea, vomiting, diarrhea or abdominal pain.  GENITOURINARY: No dysuria, hematuria.  ENDOCRINE: No polyuria, nocturia,  HEMATOLOGY: No anemia, easy bruising or bleeding SKIN: No rash or lesion. MUSCULOSKELETAL: No joint pain or arthritis.   NEUROLOGIC: No tingling, numbness, weakness.  PSYCHIATRY: No anxiety or depression.   DRUG ALLERGIES:   Allergies  Allergen Reactions  . Ambien [Zolpidem Tartrate] Other (See Comments)    Reaction: altered mental status  . Amoxicillin Other (See Comments)    Reaction: blisters in mouth Has patient had a PCN reaction causing immediate rash, facial/tongue/throat swelling, SOB or lightheadedness with hypotension: Yes Has patient had a PCN reaction causing severe rash involving mucus membranes or skin necrosis: No Has patient had a PCN reaction that required hospitalization No Has patient had a PCN reaction occurring within the last 10 years: Yes If all of the above answers are "NO", then may proceed with Cephalosporin use.     VITALS:  Blood pressure 133/82, pulse (!) 116, temperature 98.5 F (36.9 C), temperature source Oral, resp. rate 16, height  (1.778 m), weight 93 kg (205 lb), SpO2 94 %.  PHYSICAL EXAMINATION:  GENERAL:  71  y.o.-year-old patient lying in the bed with no acute distress.  EYES: Pupils equal, round, reactive to light and accommodation. No scleral icterus. Extraocular muscles intact.  HEENT: Head atraumatic, normocephalic. Oropharynx and nasopharynx clear.  NECK:  Supple, no jugular venous distention. No thyroid enlargement, no tenderness.  LUNGS: Diminished breath sounds on the left lower lobe, moderate breath sounds bilaterally, no wheezing, rales,rhonchi or crepitation. No use of accessory muscles of respiration.  CARDIOVASCULAR: S1, S2 normal. No murmurs, rubs, or gallops.  ABDOMEN: Soft, nontender, nondistended. Bowel sounds present. No organomegaly or mass.  EXTREMITIES: 1+ dependent pedal edema, no cyanosis, or clubbing.  NEUROLOGIC: Cranial nerves II through XII are intact. Muscle strength 5/5 in all extremities. Sensation intact. Gait not checked.  PSYCHIATRIC: The patient is alert and oriented x 3.  SKIN: No obvious rash, lesion, or ulcer.    LABORATORY PANEL:   CBC  Recent Labs Lab 01/16/17 0458  WBC 9.4  HGB 11.7*  HCT 34.8*  PLT 311   ------------------------------------------------------------------------------------------------------------------  Chemistries   Recent Labs Lab 01/15/17 1604 01/16/17 0458 01/17/17 0937  NA 138 136  --   K 3.5 3.0* 3.4*  CL 97* 99*  --   CO2 30 29  --   GLUCOSE 92 95  --   BUN 11 11  --   CREATININE 0.72 0.71  --   CALCIUM 8.9 8.4*  --   MG  --   --  1.8  AST 23  --   --   ALT 13*  --   --   ALKPHOS 136*  --   --   BILITOT 0.5  --   --    ------------------------------------------------------------------------------------------------------------------  Cardiac Enzymes  Recent Labs Lab 01/15/17 1604  TROPONINI <0.03   ------------------------------------------------------------------------------------------------------------------  RADIOLOGY:  Dg Chest 2 View  Result Date: 01/15/2017 CLINICAL DATA:  71 year old female  with a history of confusion EXAM: CHEST  2 VIEW COMPARISON:  01/01/2017 FINDINGS: Cardiomediastinal silhouette unchanged with cardiomegaly. Opacity at the left lung base, with opacity at the posterior base on the lateral view obscuring the costophrenic sulcus. No pneumothorax. No displaced fracture. IMPRESSION: Worsening left basilar opacity, with moderate sized pleural effusion. Given the changes on prior chest x-ray this may reflect a parapneumonic effusion with left lower lobe pneumonia. Electronically Signed   By: Gilmer Mor D.O.   On: 01/15/2017 17:10   Ct Chest W Contrast  Result Date: 01/15/2017 CLINICAL DATA:  Cough.  Altered mental status. EXAM: CT CHEST WITH CONTRAST TECHNIQUE: Multidetector CT imaging of the chest was performed during intravenous contrast administration. CONTRAST:  75mL ISOVUE-300 IOPAMIDOL (ISOVUE-300) INJECTION 61% COMPARISON:  Two-view chest x-ray from the same day. FINDINGS: Cardiovascular: A large pericardial effusion is present. The heart size is normal. Coronary artery calcifications are noted. Atherosclerotic changes are noted at the aortic arch and great vessel origins there of the present descending aorta. There is no aneurysm or focal stenosis. Mediastinum/Nodes: No significant adenopathy is present. Gas is noted within the esophagus, likely within normal limits Lungs/Pleura: Left greater than right pleural effusions are present. There is volume loss at the lung bases bilaterally. No significant consolidative airspace disease is present. Minimal atelectasis is noted posteriorly in the left upper lobe as well. No significant nodule or mass lesion is present. Upper Abdomen: Limited imaging the upper abdomen demonstrates atherosclerotic changes at the origins of the celiac and superior mesenteric artery is without significant stenosis. Solid organs are within normal limits. Musculoskeletal: No focal lytic or blastic lesions are present. Mild rightward curvature is present  in the thoracic spine. No focal lytic or blastic lesions are present. IMPRESSION: 1. Large pericardial effusion. 2. Coronary artery calcifications. 3. Moderate left pleural effusion with near total collapse of the left lower lobe. 4. Small right pleural effusion with mild right basilar atelectasis. 5.  Aortic Atherosclerosis (ICD10-I70.0). Critical Value/emergent results were called by telephone at the time of interpretation on 01/15/2017 at 8:49 pm to Dr. Gladstone Pih , who verbally acknowledged these results. Electronically Signed   By: Marin Roberts M.D.   On: 01/15/2017 20:51    EKG:   Orders placed or performed during the hospital encounter of 01/15/17  . ED EKG  . ED EKG  . EKG 12-Lead  . EKG 12-Lead    ASSESSMENT AND PLAN:    1. Altered mental status-etiology unclear but suspected to be secondary to metabolic encephalopathy from suspected pneumonia. -Clinically improving, according to the son patient is at her baseline -Continue treatment for underlying pneumonia with IV antibiotics   2. Pneumonia-suspected source of patient's altered mental status.  Patient clinically denies any shortness of breath fever or any other source respiratory symptoms.  CT chest and chest x-ray shows a total collapse of her left lower lobe with a pleural effusion.  continue empirically  vancomycin, cefepime. Follow cultures if we can collect sputum sample  3. Pleural effusion-patient noted to have a large pleural effusion on the left sideOn CT chest  Pending ultrasound-guided diagnostic/therapeutic thoracentesis, scheduled for Monday. Discussed with interventional radiology and RN Nadeen Landau is on call at [phone]. Nothing by mouth after midnight  Clinically patient is afebrile and Hemodynamically stable and not showing any signs  of sepsis/empyema.  4. Pericardial effusion-this was incidentally noted on the CT chest.  Patient is tachycardic but is not hypotensive or showing  signs of temp and on physiology.   two-dimensional echocardiogram With moderate to large pericardial effusion but patient is asymptomatic; discussed with Dr. Welton Flakes and his PA recommending to clinically monitor the patient   5. Hypothyroidism-continue Synthroid.  6. Depression-continue Abilify, Effexor..  7. Essential hypertension-continue Norvasc.  8. Hyperlipidemia-continue atorvastatin.  9. Neuropathy-continue Lyrica.   PT eval  All the records are reviewed and case discussed with Care Management/Social Workerr. Management plans discussed with the patient, son at bedside and they are in agreement.  CODE STATUS: fc   TOTAL TIME TAKING CARE OF THIS PATIENT: 36  minutes.   POSSIBLE D/C IN 2-3 DAYS, DEPENDING ON CLINICAL CONDITION.  Note: This dictation was prepared with Dragon dictation along with smaller phrase technology. Any transcriptional errors that result from this process are unintentional.   Ramonita Lab M.D on 01/17/2017 at 1:32 PM  Between 7am to 6pm - Pager - [phone] After 6pm go to [email or web address] - password [email or web address] [facility]  [place] [facility]  Office  [phone]  CC: Primary care physician; Center, [facility]

## 2017-01-18 ENCOUNTER — Inpatient Hospital Stay: Payer: Medicare Other

## 2017-01-18 LAB — BODY FLUID CELL COUNT WITH DIFFERENTIAL
Eos, Fluid: 0 %
Lymphs, Fluid: 44 %
Monocyte-Macrophage-Serous Fluid: 23 %
Neutrophil Count, Fluid: 33 %
Total Nucleated Cell Count, Fluid: 1281 cu mm

## 2017-01-18 LAB — VANCOMYCIN, RANDOM: Vancomycin Rm: 28

## 2017-01-18 LAB — CBC WITH DIFFERENTIAL/PLATELET
Basophils Absolute: 0.1 10*3/uL (ref 0–0.1)
Basophils Relative: 1 %
Eosinophils Absolute: 0.2 10*3/uL (ref 0–0.7)
Eosinophils Relative: 2 %
HCT: 36.9 % (ref 35.0–47.0)
Hemoglobin: 12.3 g/dL (ref 12.0–16.0)
Lymphocytes Relative: 20 %
Lymphs Abs: 2 10*3/uL (ref 1.0–3.6)
MCH: 26.1 pg (ref 26.0–34.0)
MCHC: 33.3 g/dL (ref 32.0–36.0)
MCV: 78.3 fL — ABNORMAL LOW (ref 80.0–100.0)
Monocytes Absolute: 1 10*3/uL — ABNORMAL HIGH (ref 0.2–0.9)
Monocytes Relative: 10 %
Neutro Abs: 6.6 10*3/uL — ABNORMAL HIGH (ref 1.4–6.5)
Neutrophils Relative %: 67 %
Platelets: 337 10*3/uL (ref 150–440)
RBC: 4.71 MIL/uL (ref 3.80–5.20)
RDW: 15.2 % — ABNORMAL HIGH (ref 11.5–14.5)
WBC: 9.9 10*3/uL (ref 3.6–11.0)

## 2017-01-18 LAB — PROTIME-INR
INR: 1.11
Prothrombin Time: 14.2 seconds (ref 11.4–15.2)

## 2017-01-18 LAB — BASIC METABOLIC PANEL
Anion gap: 9 (ref 5–15)
BUN: 15 mg/dL (ref 6–20)
CO2: 30 mmol/L (ref 22–32)
Calcium: 8.7 mg/dL — ABNORMAL LOW (ref 8.9–10.3)
Chloride: 98 mmol/L — ABNORMAL LOW (ref 101–111)
Creatinine, Ser: 0.59 mg/dL (ref 0.44–1.00)
GFR calc Af Amer: >60 mL/min (ref >60)
GFR calc non Af Amer: >60 mL/min (ref >60)
Glucose, Bld: 102 mg/dL — ABNORMAL HIGH (ref 65–99)
Potassium: 3 mmol/L — ABNORMAL LOW (ref 3.5–5.1)
Sodium: 137 mmol/L (ref 135–145)

## 2017-01-18 LAB — PROTEIN, PLEURAL OR PERITONEAL FLUID: Total protein, fluid: 3.8 g/dL

## 2017-01-18 LAB — MRSA PCR SCREENING: MRSA by PCR: NEGATIVE

## 2017-01-18 LAB — LACTATE DEHYDROGENASE, PLEURAL OR PERITONEAL FLUID: LD, Fluid: 173 U/L — ABNORMAL HIGH (ref 3–23)

## 2017-01-18 LAB — GLUCOSE, PLEURAL OR PERITONEAL FLUID: Glucose, Fluid: 99 mg/dL

## 2017-01-18 MED ORDER — POTASSIUM CHLORIDE CRYS ER 20 MEQ PO TBCR
40.0000 meq | EXTENDED_RELEASE_TABLET | ORAL | Status: AC
  Administered 2017-01-18 (×2): 40 meq via ORAL
  Filled 2017-01-18 (×2): qty 2

## 2017-01-18 MED ORDER — BACLOFEN 10 MG PO TABS
20.0000 mg | ORAL_TABLET | Freq: Every day | ORAL | Status: DC
  Administered 2017-01-20 – 2017-01-23 (×4): 20 mg via ORAL
  Filled 2017-01-18: qty 2
  Filled 2017-01-18: qty 1
  Filled 2017-01-18 (×3): qty 2

## 2017-01-18 MED ORDER — BACLOFEN 10 MG PO TABS
20.0000 mg | ORAL_TABLET | Freq: Every day | ORAL | Status: DC
  Administered 2017-01-18: 20 mg via ORAL
  Filled 2017-01-18: qty 1
  Filled 2017-01-18: qty 2

## 2017-01-18 MED ORDER — BACLOFEN 10 MG PO TABS
10.0000 mg | ORAL_TABLET | Freq: Every morning | ORAL | Status: DC
  Administered 2017-01-21 – 2017-01-23 (×2): 10 mg via ORAL
  Filled 2017-01-18 (×6): qty 1

## 2017-01-18 MED ORDER — BACLOFEN 10 MG PO TABS: 10.0000 mg | ORAL_TABLET | Freq: Every day | ORAL | Status: DC

## 2017-01-18 NOTE — Progress Notes (Signed)
[facility] - [place] at [facility]   [name]    MR#:  [id number]  DATE OF BIRTH:  1945/12/01  SUBJECTIVE:  CHIEF COMPLAINT:  Patient is chronically bedbound , has Michiel Sites lift at home. Denies any shortness of breath or palpitations  REVIEW OF SYSTEMS:  CONSTITUTIONAL: No fever, Reporting fatigue or weakness.  EYES: No blurred or double vision.  EARS, NOSE, AND THROAT: No tinnitus or ear pain.  RESPIRATORY: No cough, reports exertional shortness of breath, denies wheezing or hemoptysis.  CARDIOVASCULAR: No chest pain, orthopnea, edema.  GASTROINTESTINAL: No nausea, vomiting, diarrhea or abdominal pain.  GENITOURINARY: No dysuria, hematuria.  ENDOCRINE: No polyuria, nocturia,  HEMATOLOGY: No anemia, easy bruising or bleeding SKIN: No rash or lesion. MUSCULOSKELETAL: No joint pain or arthritis.   NEUROLOGIC: Chronically bedbound  PSYCHIATRY: No anxiety or depression.   DRUG ALLERGIES:   Allergies  Allergen Reactions  . Ambien [Zolpidem Tartrate] Other (See Comments)    Reaction: altered mental status  . Amoxicillin Other (See Comments)    Reaction: blisters in mouth Has patient had a PCN reaction causing immediate rash, facial/tongue/throat swelling, SOB or lightheadedness with hypotension: Yes Has patient had a PCN reaction causing severe rash involving mucus membranes or skin necrosis: No Has patient had a PCN reaction that required hospitalization No Has patient had a PCN reaction occurring within the last 10 years: Yes If all of the above answers are "NO", then may proceed with Cephalosporin use.     VITALS:  Blood pressure (!) 156/85, pulse 96, temperature 98.4 F (36.9 C), temperature source Oral, resp. rate 17, height  (1.778 m), weight 93 kg (205 lb), SpO2 94 %.  PHYSICAL EXAMINATION:  GENERAL:  71 y.o.-year-old patient lying in the bed with no acute distress.  EYES: Pupils equal, round, reactive to light and  accommodation. No scleral icterus. Extraocular muscles intact.  HEENT: Head atraumatic, normocephalic. Oropharynx and nasopharynx clear.  NECK:  Supple, no jugular venous distention. No thyroid enlargement, no tenderness.  LUNGS: Diminished breath sounds on the left lower lobe, moderate breath sounds bilaterally, no wheezing, rales,rhonchi or crepitation. No use of accessory muscles of respiration.  CARDIOVASCULAR: S1, S2 normal. No murmurs, rubs, or gallops.  ABDOMEN: Soft, nontender, nondistended. Bowel sounds present. No organomegaly or mass.  EXTREMITIES: 1+ dependent pedal edema, no cyanosis, or clubbing.  NEUROLOGIC: Chronically bedbound from lower extremity weakness  PSYCHIATRIC: The patient is alert and oriented x 3.  SKIN: No obvious rash, lesion, or ulcer.    LABORATORY PANEL:   CBC  Recent Labs Lab 01/18/17 0353  WBC 9.9  HGB 12.3  HCT 36.9  PLT 337   ------------------------------------------------------------------------------------------------------------------  Chemistries   Recent Labs Lab 01/15/17 1604  01/17/17 0937 01/18/17 0353  NA 138  < >  --  137  K 3.5  < > 3.4* 3.0*  CL 97*  < >  --  98*  CO2 30  < >  --  30  GLUCOSE 92  < >  --  102*  BUN 11  < >  --  15  CREATININE 0.72  < >  --  0.59  CALCIUM 8.9  < >  --  8.7*  MG  --   --  1.8  --   AST 23  --   --   --   ALT 13*  --   --   --   ALKPHOS 136*  --   --   --  BILITOT 0.5  --   --   --   < > = values in this interval not displayed. ------------------------------------------------------------------------------------------------------------------  Cardiac Enzymes  Recent Labs Lab 01/15/17 1604  TROPONINI <0.03   ------------------------------------------------------------------------------------------------------------------  RADIOLOGY:  No results found.  EKG:   Orders placed or performed during the hospital encounter of 01/15/17  . ED EKG  . ED EKG  . EKG 12-Lead  . EKG  12-Lead    ASSESSMENT AND PLAN:    1. Altered mental status- suspected to be secondary to metabolic encephalopathy from suspected pneumonia. -Clinically improving, according to the son patient is at her baseline -Continue treatment for underlying pneumonia with IV antibiotics  -Repeat chest x-ray  2. Pneumonia-suspected source of patient's altered mental status.  Patient clinically denies any shortness of breath fever or any other source respiratory symptoms.  CT chest and chest x-ray shows a total collapse of her left lower lobe with a pleural effusion.   for ultrasound-guided thoracentesis today continue empirically  vancomycin, cefepime. Follow cultures if we can collect sputum sample Rrepeat chest x-ray   3. Pleural effusion-patient noted to have a large pleural effusion on the left sideOn CT chest  ultrasound-guided diagnostic/therapeutic thoracentesis, scheduled for today. Discussed with interventional radiology and RN Nadeen Landau is on call at [phone]. Nothing by mouth after midnight  Clinically patient is afebrile and Hemodynamically stable and not showing any signs of sepsis/empyema.  4. Pericardial effusion-this was incidentally noted on the CT chest.  Patient is tachycardic but is not hypotensive or showing signs of temp and on physiology.   two-dimensional echocardiogram With moderate to large pericardial effusion but patient is asymptomatic; discussed with Dr. Welton Flakes and his PA recommending to clinically monitor the patient   5. Hypothyroidism-continue Synthroid.  6. Depression-continue Abilify, Effexor..  7. Essential hypertension-continue Norvasc.  8. Hyperlipidemia-continue atorvastatin.  9. Neuropathy-continue Lyrica.     All the records are reviewed and case discussed with Care Management/Social Workerr. Management plans discussed with the patient, son at bedside and they are in agreement.  CODE STATUS: fc   TOTAL TIME TAKING CARE OF  THIS PATIENT: 33  minutes.   POSSIBLE D/C IN 2-3 DAYS, DEPENDING ON CLINICAL CONDITION.  Note: This dictation was prepared with Dragon dictation along with smaller phrase technology. Any transcriptional errors that result from this process are unintentional.   Ramonita Lab M.D on 01/18/2017 at 8:49 AM  Between 7am to 6pm - Pager - [phone] After 6pm go to [email or web address] - password [email or web address] [facility]  [place] [facility]  Office  [phone]  CC: Primary care physician; Center, [facility]

## 2017-01-18 NOTE — Progress Notes (Signed)
SUBJECTIVE: Patient denies any chest pain or shortness of breath and is having left thoracocentesis   Vitals:   01/17/17 1002 01/17/17 1508 01/17/17 1938 01/18/17 0837  BP: 133/82 110/70 133/86 (!) 156/85  Pulse: (!) 116 (!) 106 (!) 104 96  Resp:  Temp:  97.9 F (36.6 C) 98.4 F (36.9 C)   TempSrc:  Oral Oral   SpO2:  95% 94% 94%  Weight:      Height:        Intake/Output Summary (Last 24 hours) at 01/18/17 0850 Last data filed at 01/18/17 0653  Gross per 24 hour  Intake              200 ml  Output             1025 ml  Net             -825 ml    LABS: Basic Metabolic Panel:  Recent Labs  45/40/98 0458 01/17/17 0937 01/18/17 0353  NA 136  --  137  K 3.0* 3.4* 3.0*  CL 99*  --  98*  CO2 29  --  30  GLUCOSE 95  --  102*  BUN 11  --  15  CREATININE 0.71  --  0.59  CALCIUM 8.4*  --  8.7*  MG  --  1.8  --    Liver Function Tests:  Recent Labs  01/15/17 1604  AST 23  ALT 13*  ALKPHOS 136*  BILITOT 0.5  PROT 6.9  ALBUMIN 2.5*   No results for input(s): LIPASE, AMYLASE in the last 72 hours. CBC:  Recent Labs  01/15/17 1604 01/16/17 0458 01/18/17 0353  WBC 10.1 9.4 9.9  NEUTROABS 7.2*  --  6.6*  HGB 13.1 11.7* 12.3  HCT 39.5 34.8* 36.9  MCV 79.6* 78.3* 78.3*  PLT 337 311 337   Cardiac Enzymes:  Recent Labs  01/15/17 1604  TROPONINI <0.03   BNP: Invalid input(s): POCBNP D-Dimer: No results for input(s): DDIMER in the last 72 hours. Hemoglobin A1C: No results for input(s): HGBA1C in the last 72 hours. Fasting Lipid Panel: No results for input(s): CHOL, HDL, LDLCALC, TRIG, CHOLHDL, LDLDIRECT in the last 72 hours. Thyroid Function Tests:  Recent Labs  01/15/17 1604  TSH 0.753   Anemia Panel: No results for input(s): VITAMINB12, FOLATE, FERRITIN, TIBC, IRON, RETICCTPCT in the last 72 hours.   PHYSICAL EXAM General: Well developed, well nourished, in no acute distress HEENT:  Normocephalic and atramatic Neck:  No JVD.   Lungs: Clear bilaterally to auscultation and percussion. Heart: HRRR . Normal S1 and S2 without gallops or murmurs.  Abdomen: Bowel sounds are positive, abdomen soft and non-tender  Msk:  Back normal, normal gait. Normal strength and tone for age. Extremities: No clubbing, cyanosis or edema.   Neuro: Alert and oriented X 3. Psych:  Good affect, responds appropriately  TELEMETRY:Sinus rhythm  ASSESSMENT AND PLAN: Large left pleural effusion with echocardiogram reported to have a large pericardial effusion also. I did not read the original echocardiogram and I went ahead and read it again and it does not have a large pericardial effusion and in fact is mild to moderate but does have a large left pleural effusion which is being drained. Discussed with husband and will treat the pericardial effusion conservatively.  Active Problems:   Pneumonia    Laurier Nancy, MD, [facility] 01/18/2017 8:50 AM

## 2017-01-18 NOTE — Progress Notes (Signed)
Pt is difficult to arouse. O2 levels at 88% rm air. Nasal canula applied at 2 L and O2 sats brought up to 92%. Pt is more awake now and can say her name. Delayed speech.. MD made aware. Will continue to monitor.

## 2017-01-18 NOTE — Progress Notes (Signed)
PT Cancellation Note  Patient Details Name: Jessica [name] MRN: [id number] DOB: [birth year]   Cancelled Treatment:    Reason Eval/Treat Not Completed:  (Initial PT Eval and Treat order noted discontinued by attending physician.  Please re-consult should needs change.)   Kannon Granderson H. Manson Passey, PT, DPT, NCS 01/18/17, 9:26 AM [phone]

## 2017-01-18 NOTE — Procedures (Signed)
Interventional Radiology Procedure Note  Procedure: Left thoracentesis under US guidance  Complications: None  Estimated Blood Loss: None  375 mL, amber colored pleural fluid removed from left pleural space.  Jodi Marble. Fredia Sorrow, M.D Pager:  [phone]

## 2017-01-19 ENCOUNTER — Inpatient Hospital Stay: Payer: Medicare Other

## 2017-01-19 DIAGNOSIS — R4182 Altered mental status, unspecified: Secondary | ICD-10-CM

## 2017-01-19 LAB — BASIC METABOLIC PANEL
Anion gap: 8 (ref 5–15)
BUN: 16 mg/dL (ref 6–20)
CO2: 28 mmol/L (ref 22–32)
Calcium: 8.7 mg/dL — ABNORMAL LOW (ref 8.9–10.3)
Chloride: 99 mmol/L — ABNORMAL LOW (ref 101–111)
Creatinine, Ser: 0.59 mg/dL (ref 0.44–1.00)
GFR calc Af Amer: >60 mL/min (ref >60)
GFR calc non Af Amer: >60 mL/min (ref >60)
Glucose, Bld: 99 mg/dL (ref 65–99)
Potassium: 3.9 mmol/L (ref 3.5–5.1)
Sodium: 135 mmol/L (ref 135–145)

## 2017-01-19 LAB — HEPATIC FUNCTION PANEL
ALT: 11 U/L — ABNORMAL LOW (ref 14–54)
AST: 18 U/L (ref 15–41)
Albumin: 2.1 g/dL — ABNORMAL LOW (ref 3.5–5.0)
Alkaline Phosphatase: 119 U/L (ref 38–126)
Bilirubin, Direct: 0.2 mg/dL (ref 0.1–0.5)
Indirect Bilirubin: 0.6 mg/dL (ref 0.3–0.9)
Total Bilirubin: 0.8 mg/dL (ref 0.3–1.2)
Total Protein: 6.3 g/dL — ABNORMAL LOW (ref 6.5–8.1)

## 2017-01-19 LAB — CBC
HCT: 37.7 % (ref 35.0–47.0)
Hemoglobin: 12.4 g/dL (ref 12.0–16.0)
MCH: 25.7 pg — ABNORMAL LOW (ref 26.0–34.0)
MCHC: 32.8 g/dL (ref 32.0–36.0)
MCV: 78.4 fL — ABNORMAL LOW (ref 80.0–100.0)
Platelets: 293 10*3/uL (ref 150–440)
RBC: 4.81 MIL/uL (ref 3.80–5.20)
RDW: 14.8 % — ABNORMAL HIGH (ref 11.5–14.5)
WBC: 11.4 10*3/uL — ABNORMAL HIGH (ref 3.6–11.0)

## 2017-01-19 LAB — COMPREHENSIVE METABOLIC PANEL
ALT: 10 U/L — ABNORMAL LOW (ref 14–54)
AST: 18 U/L (ref 15–41)
Albumin: 2.1 g/dL — ABNORMAL LOW (ref 3.5–5.0)
Alkaline Phosphatase: 123 U/L (ref 38–126)
Anion gap: 9 (ref 5–15)
BUN: 16 mg/dL (ref 6–20)
CO2: 26 mmol/L (ref 22–32)
Calcium: 8.8 mg/dL — ABNORMAL LOW (ref 8.9–10.3)
Chloride: 100 mmol/L — ABNORMAL LOW (ref 101–111)
Creatinine, Ser: 0.73 mg/dL (ref 0.44–1.00)
GFR calc Af Amer: >60 mL/min (ref >60)
GFR calc non Af Amer: >60 mL/min (ref >60)
Glucose, Bld: 99 mg/dL (ref 65–99)
Potassium: 3.8 mmol/L (ref 3.5–5.1)
Sodium: 135 mmol/L (ref 135–145)
Total Bilirubin: 0.8 mg/dL (ref 0.3–1.2)
Total Protein: 6.3 g/dL — ABNORMAL LOW (ref 6.5–8.1)

## 2017-01-19 LAB — PH, BODY FLUID: pH, Body Fluid: 7.7

## 2017-01-19 LAB — VANCOMYCIN, RANDOM: Vancomycin Rm: 16

## 2017-01-19 LAB — CYTOLOGY - NON PAP

## 2017-01-19 MED ORDER — DIPHENHYDRAMINE HCL 50 MG/ML IJ SOLN
INTRAMUSCULAR | Status: AC
  Filled 2017-01-19: qty 1

## 2017-01-19 MED ORDER — ALBUMIN HUMAN 5 % IV SOLN
12.5000 g | Freq: Once | INTRAVENOUS | Status: AC
Start: 2017-01-19 — End: 2017-01-19
  Administered 2017-01-19: 12.5 g via INTRAVENOUS
  Filled 2017-01-19: qty 250

## 2017-01-19 MED ORDER — VANCOMYCIN HCL IN DEXTROSE 1-5 GM/200ML-% IV SOLN
1000.0000 mg | INTRAVENOUS | Status: DC
  Filled 2017-01-19: qty 200

## 2017-01-19 MED ORDER — DIPHENHYDRAMINE HCL 50 MG/ML IJ SOLN
12.5000 mg | Freq: Once | INTRAMUSCULAR | Status: AC
  Administered 2017-01-19: 12.5 mg via INTRAVENOUS

## 2017-01-19 MED ORDER — VANCOMYCIN HCL IN DEXTROSE 1-5 GM/200ML-% IV SOLN
1000.0000 mg | INTRAVENOUS | Status: AC
  Administered 2017-01-19: 1000 mg via INTRAVENOUS
  Filled 2017-01-19: qty 200

## 2017-01-19 NOTE — Progress Notes (Signed)
SUBJECTIVE: Patient appears to be very drowsy and lethargic and sleepy and has received Benadryl.   Vitals:   01/18/17 1908 01/18/17 2205 01/19/17 0445 01/19/17 0745  BP: 117/63 113/78 135/68 114/62  Pulse: 98 (!) 120 81 73  Resp: Temp: 97.8 F (36.6 C)   97.8 F (36.6 C)  TempSrc: Oral   Axillary  SpO2: 96% 93% 90% 99%  Weight:      Height:        Intake/Output Summary (Last 24 hours) at 01/19/17 0903 Last data filed at 01/19/17 0431  Gross per 24 hour  Intake              340 ml  Output              650 ml  Net             -310 ml    LABS: Basic Metabolic Panel:  Recent Labs  96/04/54 0937 01/18/17 0353 01/19/17 0453  NA  --  137 135  K 3.4* 3.0* 3.9  CL  --  98* 99*  CO2  --  30 28  GLUCOSE  --  102* 99  BUN  --  15 16  CREATININE  --  0.59 0.59  CALCIUM  --  8.7* 8.7*  MG 1.8  --   --    Liver Function Tests: No results for input(s): AST, ALT, ALKPHOS, BILITOT, PROT, ALBUMIN in the last 72 hours. No results for input(s): LIPASE, AMYLASE in the last 72 hours. CBC:  Recent Labs  01/18/17 0353  WBC 9.9  NEUTROABS 6.6*  HGB 12.3  HCT 36.9  MCV 78.3*  PLT 337   Cardiac Enzymes: No results for input(s): CKTOTAL, CKMB, CKMBINDEX, TROPONINI in the last 72 hours. BNP: Invalid input(s): POCBNP D-Dimer: No results for input(s): DDIMER in the last 72 hours. Hemoglobin A1C: No results for input(s): HGBA1C in the last 72 hours. Fasting Lipid Panel: No results for input(s): CHOL, HDL, LDLCALC, TRIG, CHOLHDL, LDLDIRECT in the last 72 hours. Thyroid Function Tests: No results for input(s): TSH, T4TOTAL, T3FREE, THYROIDAB in the last 72 hours.  Invalid input(s): FREET3 Anemia Panel: No results for input(s): VITAMINB12, FOLATE, FERRITIN, TIBC, IRON, RETICCTPCT in the last 72 hours.   PHYSICAL EXAM General: Well developed, well nourished, in no acute distress HEENT:  Normocephalic and atramatic Neck:  No JVD.  Lungs: Clear bilaterally to  auscultation and percussion. Heart: HRRR . Normal S1 and S2 without gallops or murmurs.  Abdomen: Bowel sounds are positive, abdomen soft and non-tender  Msk:  Back normal, normal gait. Normal strength and tone for age. Extremities: No clubbing, cyanosis or edema.   Neuro: Alert and oriented X 3. Psych:  Good affect, responds appropriately  TELEMETRY:Sinus rhythm  ASSESSMENT AND PLAN: Moderate size pericardial effusion with confusion episode at this time and no evidence of pericardial tamponade or hemodynamic compromise. Patient had thoracocentesis yesterday and has on chest x-ray no evidence of pneumothorax and pleural effusion is very small. Patient will be evaluated by psychiatry as has history of depression.  Active Problems:   Pneumonia    Laurier Nancy, MD, [facility] 01/19/2017 9:03 AM

## 2017-01-19 NOTE — Consult Note (Addendum)
Reason for Consult:AMS Referring Physician: Gouru  CC: AMS  HPI: Jessica [name] is an 71 y.o. female admitted on 10/5 with altered mental status.  Was found to have pleural and pericardial effusions.  Being followed by cardiology.  On last evening was doing well when left by son.  When he returned patient was less responsive.  It appears that overnight patient became difficult to arouse and was noted to be hypoxic with O2 sat of 88%.  Was given NCO2 and was more awake but was not felt to be back to baseline per family.  Head CT was performed and showed no acute changes.  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  Was asleep for most of the morning.  This afternoon is starting to be more awake.  Recognizes family and is able to follow commands.  Smiles at times and is verbalizing.  Still not at baseline.  Son reports improved from overnight, much like her initial presentation.       Past Medical History:  Diagnosis Date  . Chronic pain   . Depression   . HLD (hyperlipidemia)   . HTN (hypertension)   . Hypothyroidism   . Morbid obesity (HCC)   . Recurrent UTI   . Spinal stenosis   . Visual hallucinations     Past Surgical History:  Procedure Laterality Date  . ABDOMINAL HYSTERECTOMY    . BACK SURGERY    . HEMORROIDECTOMY      Family History  Problem Relation Age of Onset  . Heart disease Father   . Deep vein thrombosis Father     Social History:  reports that she has never smoked. She has quit using smokeless tobacco. Her smokeless tobacco use included Snuff. She reports that she does not drink alcohol or use drugs.  Allergies  Allergen Reactions  . Ambien [Zolpidem Tartrate] Other (See Comments)    Reaction: altered mental status  . Amoxicillin Other (See Comments)    Reaction: blisters in mouth Has patient had a PCN reaction causing immediate rash, facial/tongue/throat swelling, SOB or lightheadedness with  hypotension: Yes Has patient had a PCN reaction causing severe rash involving mucus membranes or skin necrosis: No Has patient had a PCN reaction that required hospitalization No Has patient had a PCN reaction occurring within the last 10 years: Yes If all of the above answers are "NO", then may proceed with Cephalosporin use.     Medications:  I have reviewed the patient's current medications. Prior to Admission:  Prescriptions Prior to Admission  Medication Sig Dispense Refill Last Dose  . acetaminophen (TYLENOL) 325 MG tablet Take 650 mg by mouth every 6 (six) hours as needed for mild pain.   PRN at PRN  . albuterol (PROVENTIL HFA;VENTOLIN HFA) 108 (90 Base) MCG/ACT inhaler Inhale 2 puffs into the lungs every 4 (four) hours as needed for wheezing or shortness of breath.   PRN at PRN  . albuterol (PROVENTIL) (2.5 MG/3ML) 0.083% nebulizer solution Take 2.5 mg by nebulization every 6 (six) hours as needed for wheezing or shortness of breath.   PRN at PRN  . amlodipine-atorvastatin (CADUET) 2.5-10 MG tablet Take 1 tablet by mouth daily.   01/01/2017 at am  . ARIPiprazole (ABILIFY) 2 MG tablet Take 2 mg by mouth daily.  1 01/01/2017 at am  . aspirin EC 81 MG tablet Take 81 mg by mouth daily.   01/01/2017 at 0730  . baclofen (LIORESAL) 20  MG tablet Take 5-20 mg by mouth 3 (three) times daily. 10 mg every morning and 20 mg every evening   01/01/2017 at pm  . docusate sodium (COLACE) 100 MG capsule Take 100 mg by mouth 2 (two) times daily.   01/01/2017 at am  . fluconazole (DIFLUCAN) 100 MG tablet Take 100 mg by mouth once a week.     . fluticasone (FLONASE) 50 MCG/ACT nasal spray Place 2 sprays into both nostrils daily as needed. For congestion   PRN at PRN  . furosemide (LASIX) 20 MG tablet Take 20 mg by mouth daily.   01/01/2017 at am  . levothyroxine (SYNTHROID, LEVOTHROID) 150 MCG tablet Take 150 mcg by mouth daily before breakfast.   01/01/2017 at am  . mupirocin ointment (BACTROBAN) 2 % Place 1  application into the nose 2 (two) times daily. Apply to open skin on toes   01/01/2017 at am  . nystatin cream (MYCOSTATIN) Apply 1 application topically 2 (two) times daily. Apply to rash in skin folds   01/01/2017 at am  . polyethylene glycol (MIRALAX / GLYCOLAX) packet Take 17 g by mouth daily.   01/01/2017 at am  . potassium chloride SA (K-DUR,KLOR-CON) 20 MEQ tablet Take 30 mEq by mouth daily.    01/01/2017 at am  . pregabalin (LYRICA) 150 MG capsule Take 150 mg by mouth 3 (three) times daily.   01/01/2017 at pm  . senna-docusate (SENOKOT-S) 8.6-50 MG tablet Take 2 tablets by mouth daily.   01/01/2017 at am  . Simethicone 180 MG CAPS Take 2 capsules by mouth 3 (three) times daily.   01/01/2017 at pm  . traZODone (DESYREL) 100 MG tablet Take 50 mg by mouth at bedtime.    12/31/2016 at pm  . triamcinolone ointment (KENALOG) 0.1 % Apply 1 application topically 2 (two) times daily.   01/01/2017 at am  . Venlafaxine HCl 225 MG TB24 Take 225 mg by mouth daily.   01/01/2017 at am  . vitamin B-12 (CYANOCOBALAMIN) 1000 MCG tablet Take 1,000 mcg by mouth daily.   01/01/2017 at am  . Vitamin D, Ergocalciferol, (DRISDOL) 50000 units CAPS capsule Take 50,000 Units by mouth every 30 (thirty) days.   Past Month at Unknown time  . Zinc Oxide 13 % CREA Apply 1 application topically as needed for irritation.   PRN at PRN  . hydrocortisone (ANUSOL-HC) 2.5 % rectal cream Place 1 application rectally at bedtime as needed for hemorrhoids or itching.   PRN at PRN  . penciclovir (DENAVIR) 1 % cream Apply 1 application topically 5 (five) times daily as needed. Apply to lip for lesions   PRN at PRN   Scheduled: . amLODipine  2.5 mg Oral Daily   And  . atorvastatin  10 mg Oral Daily  . ARIPiprazole  2 mg Oral Daily  . aspirin EC  81 mg Oral Daily  . baclofen  10 mg Oral q morning - 10a   And  . baclofen  20 mg Oral QHS  . docusate sodium  100 mg Oral BID  . enoxaparin (LOVENOX) injection  40 mg Subcutaneous Q24H  .  fluconazole  200 mg Oral Daily  . furosemide  20 mg Oral Daily  . levothyroxine  150 mcg Oral QAC breakfast  . mupirocin ointment  1 application Nasal BID  . nystatin cream  1 application Topical BID  . polyethylene glycol  17 g Oral Daily  . pregabalin  150 mg Oral TID  . senna-docusate  2  tablet Oral Daily  . traZODone  50 mg Oral QHS  . venlafaxine XR  225 mg Oral Daily  . vitamin B-12  1,000 mcg Oral Daily  . Vitamin D (Ergocalciferol)  50,000 Units Oral Q30 days    ROS: Unable to provide due to mental status  Physical Examination: Blood pressure 114/62, pulse 73, temperature 97.8 F (36.6 C), temperature source Axillary, resp. rate 14, height 5\' 10"  (1.778 m), weight 93 kg (205 lb), SpO2 99 %.  HEENT-  Normocephalic, no lesions, without obvious abnormality.  Normal external eye and conjunctiva.  Normal TM's bilaterally.  Normal auditory canals and external ears. Normal external nose, mucus membranes and septum.  Normal pharynx. Cardiovascular- S1, S2 normal, pulses palpable throughout   Lungs- chest clear, no wheezing, rales, normal symmetric air entry Abdomen- soft, non-tender; bowel sounds normal; no masses,  no organomegaly Extremities- LE edema Lymph-no adenopathy palpable Musculoskeletal-no joint tenderness, deformity or swelling Skin-warm and dry, no hyperpigmentation, vitiligo, or suspicious lesions  Neurological Examination   Mental Status: Lethargic.  Requires repeated stimulation.  Says one to two words and nods her head in response to questioning when able to be kept awake. Follows some simple commands.   Cranial Nerves: II: Discs flat bilaterally; Blinks to bilateral confrontation, pupils equal, round, reactive to light and accommodation III,IV, VI: ptosis not present, intact oculocephalic maneuvers V,VII: intact corneals VIII: hearing normal bilaterally IX,X: unable to test XI: unable to test XII: unable to test Motor: Minimal movement ntoed in the upper  extremities to command.  Increased tone bilaterally.  No movement noted in the lower extremities Sensory: Patient does not respond to noxious stimuli throughout Deep Tendon Reflexes: 1+ in the upper extremities and absent in the lower extremities Plantars: Right: mute   Left: mute Cerebellar: Unable to perform Gait: patient nonambulatory    Laboratory Studies:   Basic Metabolic Panel:  Recent Labs Lab 01/15/17 1604 01/16/17 0458 01/17/17 0937 01/18/17 0353 01/19/17 0453  NA 138 136  --  137 135  K 3.5 3.0* 3.4* 3.0* 3.9  CL 97* 99*  --  98* 99*  CO2 30 29  --  30 28  GLUCOSE 92 95  --  102* 99  BUN 11 11  --  15 16  CREATININE 0.72 0.71  --  0.59 0.59  CALCIUM 8.9 8.4*  --  8.7* 8.7*  MG  --   --  1.8  --   --     Liver Function Tests:  Recent Labs Lab 01/15/17 1604  AST 23  ALT 13*  ALKPHOS 136*  BILITOT 0.5  PROT 6.9  ALBUMIN 2.5*   No results for input(s): LIPASE, AMYLASE in the last 168 hours. No results for input(s): AMMONIA in the last 168 hours.  CBC:  Recent Labs Lab 01/15/17 1604 01/16/17 0458 01/18/17 0353  WBC 10.1 9.4 9.9  NEUTROABS 7.2*  --  6.6*  HGB 13.1 11.7* 12.3  HCT 39.5 34.8* 36.9  MCV 79.6* 78.3* 78.3*  PLT 337 311 337    Cardiac Enzymes:  Recent Labs Lab 01/15/17 1604  TROPONINI <0.03    BNP: Invalid input(s): POCBNP  CBG: No results for input(s): GLUCAP in the last 168 hours.  Microbiology: Results for orders placed or performed during the hospital encounter of 01/15/17  Blood culture (routine x 2)     Status: None (Preliminary result)   Collection Time: 01/15/17  4:04 PM  Result Value Ref Range Status   Specimen Description BLOOD BLOOD  LEFT ARM  Final   Special Requests   Final    BOTTLES DRAWN AEROBIC AND ANAEROBIC Blood Culture adequate volume   Culture NO GROWTH 4 DAYS  Final   Report Status PENDING  Incomplete  Blood culture (routine x 2)     Status: None (Preliminary result)   Collection Time: 01/15/17   4:05 PM  Result Value Ref Range Status   Specimen Description BLOOD LEFT ANTECUBITAL  Final   Special Requests   Final    BOTTLES DRAWN AEROBIC AND ANAEROBIC Blood Culture adequate volume   Culture NO GROWTH 4 DAYS  Final   Report Status PENDING  Incomplete  Urine Culture     Status: Abnormal   Collection Time: 01/15/17  8:51 PM  Result Value Ref Range Status   Specimen Description URINE, CATHETERIZED  Final   Special Requests NONE  Final   Culture >=100,000 COLONIES/mL YEAST (A)  Final   Report Status 01/17/2017 FINAL  Final  Body fluid culture     Status: None (Preliminary result)   Collection Time: 01/18/17  9:10 AM  Result Value Ref Range Status   Specimen Description PLEURAL  Final   Special Requests NONE  Final   Gram Stain   Final    ABUNDANT WBC PRESENT,BOTH PMN AND MONONUCLEAR NO ORGANISMS SEEN    Culture   Final    NO GROWTH < 24 HOURS Performed at [facility], [address], [place], [place] [address]    Report Status PENDING  Incomplete  MRSA PCR Screening     Status: None   Collection Time: 01/18/17  2:15 PM  Result Value Ref Range Status   MRSA by PCR NEGATIVE NEGATIVE Final    Comment:        The GeneXpert MRSA Assay (FDA approved for NASAL specimens only), is one component of a comprehensive MRSA colonization surveillance program. It is not intended to diagnose MRSA infection nor to guide or monitor treatment for MRSA infections.     Coagulation Studies:  Recent Labs  01/18/17 0353  LABPROT 14.2  INR 1.11    Urinalysis: No results for input(s): COLORURINE, LABSPEC, PHURINE, GLUCOSEU, HGBUR, BILIRUBINUR, KETONESUR, PROTEINUR, UROBILINOGEN, NITRITE, LEUKOCYTESUR in the last 168 hours.  Invalid input(s): APPERANCEUR  Lipid Panel:  No results found for: CHOL, TRIG, HDL, CHOLHDL, VLDL, LDLCALC  HgbA1C:  Lab Results  Component Value Date   HGBA1C 5.7 (H) 07/06/2016    Urine Drug Screen:     Component Value Date/Time   LABOPIA NONE  DETECTED 08/29/2016 2001   COCAINSCRNUR NONE DETECTED 08/29/2016 2001   LABBENZ NONE DETECTED 08/29/2016 2001   AMPHETMU NONE DETECTED 08/29/2016 2001   THCU NONE DETECTED 08/29/2016 2001   LABBARB NONE DETECTED 08/29/2016 2001    Alcohol Level: No results for input(s): ETH in the last 168 hours.  Other results: EKG: sinus tachycardia at 103 bpm.  Imaging: Dg Chest 1 View  Result Date: 01/18/2017 CLINICAL DATA:  Status post left thoracentesis for pleural effusion. EXAM: CHEST 1 VIEW COMPARISON:  01/15/2017 FINDINGS: Stable enlargement of cardiac silhouette. No pneumothorax after left thoracentesis. Residual left lower lobe consolidation present. No pulmonary edema. Mild atelectasis at the right lung base. IMPRESSION: No pneumothorax after left thoracentesis. Underlying left lower lobe pulmonary consolidation present. Electronically Signed   By: Irish Lack M.D.   On: 01/18/2017 09:40   Dg Chest 2 View  Result Date: 01/18/2017 CLINICAL DATA:  Recent thoracentesis EXAM: CHEST  2 VIEW COMPARISON:  01/18/2017 FINDINGS: Cardiomegaly. Small left pleural effusion noted. No pneumothorax following thoracentesis. Bibasilar opacities, left greater than right could reflect atelectasis or infiltrates. IMPRESSION: Small left pleural effusion. No pneumothorax. Continued bibasilar atelectasis or infiltrates, left greater than right. Cardiomegaly. Electronically Signed   By: Charlett Nose M.D.   On: 01/18/2017 15:03   Ct Head Wo Contrast  Result Date: 01/19/2017 CLINICAL DATA:  Altered level of consciousness EXAM: CT HEAD WITHOUT CONTRAST TECHNIQUE: Contiguous axial images were obtained from the base of the skull through the vertex without intravenous contrast. COMPARISON:  Head CT 08/24/2016 FINDINGS: Brain: No mass lesion, intraparenchymal hemorrhage or extra-axial collection. No evidence of acute cortical infarct. There is periventricular hypoattenuation compatible with chronic microvascular disease.  Vascular: Atherosclerotic calcification of the vertebral and internal carotid arteries at the skull base. Skull: Normal visualized skull base, calvarium and extracranial soft tissues. Sinuses/Orbits: No sinus fluid levels or advanced mucosal thickening. No mastoid effusion. Normal orbits. IMPRESSION: Chronic ischemic microangiopathy without acute intracranial abnormality. Electronically Signed   By: Deatra Robinson M.D.   On: 01/19/2017 01:04   Dg Chest Port 1 View  Result Date: 01/18/2017 CLINICAL DATA:  Shortness of breath and confusion beginning 40 minutes ago EXAM: PORTABLE CHEST 1 VIEW COMPARISON:  Portable exam 2212 hours compared to 01/18/2017 at 1447 hours FINDINGS: Enlargement of cardiac silhouette. Mediastinal contours and pulmonary vascularity normal. Question atelectasis versus consolidation in retrocardiac LEFT lower lobe. Remaining lungs clear. Cannot exclude small LEFT pleural effusion. No pneumothorax. Bones demineralized. IMPRESSION: Enlargement of cardiac silhouette. Atelectasis versus consolidation in retrocardiac LEFT lower lobe, cannot exclude small LEFT pleural effusion. Electronically Signed   By: Ulyses Southward M.D.   On: 01/18/2017 22:34   US Thoracentesis Asp Pleural Space W/img Guide  Result Date: 01/18/2017 CLINICAL DATA:  Left pleural effusion. EXAM: ULTRASOUND GUIDED LEFT THORACENTESIS COMPARISON:  Chest x-ray and CT of the chest on 01/15/2017 PROCEDURE: An ultrasound guided thoracentesis was thoroughly discussed with the patient's son and questions answered. The benefits, risks, alternatives and complications were also discussed. The patient's son understands and wishes to proceed with the procedure. Written consent was obtained. Ultrasound was performed to localize and mark an adequate pocket of fluid in the left chest. The area was then prepped and draped in the normal sterile fashion. 1% Lidocaine was used for local anesthesia. Under ultrasound guidance a 6 French Safe-T-Centesis  catheter was introduced. Thoracentesis was performed. The catheter was removed and postprocedural ultrasound was performed. A dressing applied. COMPLICATIONS: None FINDINGS: Overall size of the left pleural effusion is small to moderate by ultrasound. A total of approximately 375 mL of amber colored fluid was removed. A fluid sample was sent for laboratory analysis. Postprocedural ultrasound shows significant diminishment in pleural effusion with a small amount of pleural fluid remaining. IMPRESSION: Successful ultrasound guided left thoracentesis yielding 375 mL of pleural fluid. Electronically Signed   By: Irish Lack M.D.   On: 01/18/2017 09:44     Assessment/Plan: 71 year old female admitted with pleural and cardiac effusions.  Patient with mental status changes at that time and has a history of mental status changes related to medical issues.  Has had IV Benadryl but is starting to improve.  Head CT reviewed and shows no acute changes.  Would like to give time for patient to improve further but if not at baseline by the morning further work up recommended.     Recommendations: 1.  CMET, CBC 2.  If not back to baseline by AM  would perform MRI of the brain without contrast and EEG 3.  Hold sedating medications   Thana Farr, MD Neurology [phone] 01/19/2017, 2:17 PM

## 2017-01-19 NOTE — Progress Notes (Signed)
Pt back from CT

## 2017-01-19 NOTE — Progress Notes (Addendum)
Pharmacy Antibiotic Note  Jessica [name] is a 71 y.o. female admitted on 01/15/2017 with HCAP/sepsis.  Pharmacy has been consulted for cefepime and vancomycin dosing.  Plan: 10/7 1438 vancomycin level reported as 41 mcg/mL. RN confirms lab was drawn before she started the infusion, but she doesn't know if the line was flushed or if level was drawn from line. Will discontinue order for now and recheck vanc level tomorrow with AM labs.   ADD: Vancomycin Random level 28. Will recheck Vancomycin random level with am labs on 10/9.   10/9 @ 0453 VR 16. 10/8 @ 0353 VR 28. Patient specific Ke: 0.0228 Patient specific t1/2: 30 hrs ~ 24 hrs  Since VR is < 20 mcg/mL and patient is clearing the drug will restart a regimen of vanc 1g IV q24h. Will check a VT 10/11 @ 0500 prior to 3rd dose to ensure patient is tolerating this regimen. Scr 0.59 renal function stable.  ADD: Vancomycin random ~ 35 hours now 16. Will give Vancomycin 1000 mg IV x 1 and will recheck Vancomycin random level in 24 hours if level within goal range. Will then start regimen.   Height:  (177.8 cm) Weight: 205 lb (93 kg) IBW/kg (Calculated) : 68.5  Temp (24hrs), Avg:97.8 F (36.6 C), Min:97.8 F (36.6 C), Max:97.8 F (36.6 C)   Recent Labs Lab 01/15/17 1604 01/15/17 1605 01/16/17 0458 01/17/17 1438 01/18/17 0353 01/19/17 0453  WBC 10.1  --  9.4  --  9.9  --   CREATININE 0.72  --  0.71  --  0.59  --   LATICACIDVEN  --  1.5  --   --   --   --   VANCOTROUGH  --   --   --  41*  --   --   VANCORANDOM  --   --   --   --  28 16    Estimated Creatinine Clearance: 82 mL/min (by C-G formula based on SCr of 0.59 mg/dL).    Allergies  Allergen Reactions  . Ambien [Zolpidem Tartrate] Other (See Comments)    Reaction: altered mental status  . Amoxicillin Other (See Comments)    Reaction: blisters in mouth Has patient had a PCN reaction causing immediate rash, facial/tongue/throat swelling, SOB or lightheadedness  with hypotension: Yes Has patient had a PCN reaction causing severe rash involving mucus membranes or skin necrosis: No Has patient had a PCN reaction that required hospitalization No Has patient had a PCN reaction occurring within the last 10 years: Yes If all of the above answers are "NO", then may proceed with Cephalosporin use.    Thank you for allowing pharmacy to be a part of this patient's care.  Demetrius Charity, PharmD  Clinical Pharmacist 01/19/2017

## 2017-01-19 NOTE — Progress Notes (Signed)
Long discussion with patients son regarding change in patients mental status. I am familiar with the patient and agree that in the past her mental status changes are usually on the delirium spectrum. Son is concerned that this time she has gone for a routine procedure that he hoped would improve her overall status but instead has resulted in decreased responsiveness. He is also frustrated about not being notified regarding CT head results which I have reassured him are not acute and do not explain the patient's current mental status. The patient has a history of psychiatric disorder. 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 He and I discussed this is part of the differential diagnosis. We agreed to try some Benadryl which hopefully allow the patient to rest and then awaken refreshed and hopefully more interactive. Even while in the room the patient responded some to her son's voice. She is very slow to move and very slow to respond but she will actually move on her own. She is not possible as a classic catatonic presentation with statue like malleability. Once she is more medically stable consider psychiatry consult.

## 2017-01-19 NOTE — Progress Notes (Signed)
Patient was sedated most of the day, now able to nod her head yes or no. Will continue to monitor ability to take meds or eat. Tolerated a couple of ice chips and sipped on some water. No IV fluids running at this time. Reordered tele for 24 hours. MD aware.

## 2017-01-19 NOTE — Progress Notes (Signed)
Pt son is angry and tearful about mothers LOC. Son shouts at times that he "wants answers". MD came to bedside to discuss POC with patient family. Chaplain services offered and declined by family.  VSS. Pupils equal, round, reactive. Pt repeats phrases and has delayed response. Flaccid limbs. Grip present bilaterally. IV Benadryl administered. Pt son states that "there is nothing you can do to help me right now."

## 2017-01-19 NOTE — Progress Notes (Signed)
Called by nursing to evaluate patient for decreased mentation. Patient had an episode of hypoxia and was somewhat less responsive. O2 via nasal cannula corrected her oxygen saturation level, and the patient became more responsive. However, family in the room bedside states that her response time is significantly slowed and her mentation status is altered from what it was earlier today. Patient does have significantly delayed response time, though she does answer questions appropriately. She is able to follow commands. We'll order CT head for initial diagnostic workup.  Kristeen Miss [facility] 01/19/2017, 1:53 AM

## 2017-01-19 NOTE — Progress Notes (Signed)
[facility] - [place] [facility]   [name]    MR#:  [id number]  DATE OF BIRTH:  Dec 05, 1945  SUBJECTIVE:  CHIEF COMPLAINT:  Patient is chronically bedbound , has Michiel Sites lift at home. Patient was very lethargic this morning has received Benadryl IV last night. Son is concerned and at bedside, patient is arousable but falling asleep, could recognize her son and was able to tell his name patient has history of depression.  REVIEW OF SYSTEMS:  Review of systems unobtainable as the patient is lethargic  DRUG ALLERGIES:   Allergies  Allergen Reactions  . Ambien [Zolpidem Tartrate] Other (See Comments)    Reaction: altered mental status  . Amoxicillin Other (See Comments)    Reaction: blisters in mouth Has patient had a PCN reaction causing immediate rash, facial/tongue/throat swelling, SOB or lightheadedness with hypotension: Yes Has patient had a PCN reaction causing severe rash involving mucus membranes or skin necrosis: No Has patient had a PCN reaction that required hospitalization No Has patient had a PCN reaction occurring within the last 10 years: Yes If all of the above answers are "NO", then may proceed with Cephalosporin use.     VITALS:  Blood pressure 114/62, pulse 73, temperature 97.8 F (36.6 C), temperature source Axillary, resp. rate 14, height  (1.778 m), weight 93 kg (205 lb), SpO2 99 %.  PHYSICAL EXAMINATION:  GENERAL:  71 y.o.-year-old patient lying in the bed with no acute distress.  EYES: Pupils equal, round, reactive to light and accommodation. No scleral icterus. Extraocular muscles intact.  HEENT: Head atraumatic, normocephalic. Oropharynx and nasopharynx clear.  NECK:  Supple, no jugular venous distention. No thyroid enlargement, no tenderness.  LUNGS: Diminished breath sounds on the left lower lobe, moderate breath sounds bilaterally, no wheezing, rales,rhonchi or crepitation. No use of accessory muscles  of respiration.  CARDIOVASCULAR: S1, S2 normal. No murmurs, rubs, or gallops.  ABDOMEN: Soft, nontender, nondistended. Bowel sounds present.  EXTREMITIES: 1+ dependent pedal edema, no cyanosis, or clubbing.  NEUROLOGIC: Chronically bedbound from lower extremity weakness ; arousable but falling asleep PSYCHIATRIC: The patient is lethargic  SKIN: Lower back-erythema is present but no ulcers   LABORATORY PANEL:   CBC  Recent Labs Lab 01/19/17 1531  WBC 11.4*  HGB 12.4  HCT 37.7  PLT 293   ------------------------------------------------------------------------------------------------------------------  Chemistries   Recent Labs Lab 01/15/17 1604  01/17/17 0937  01/19/17 0453  NA 138  < >  --   < > 135  K 3.5  < > 3.4*  < > 3.9  CL 97*  < >  --   < > 99*  CO2 30  < >  --   < > 28  GLUCOSE 92  < >  --   < > 99  BUN 11  < >  --   < > 16  CREATININE 0.72  < >  --   < > 0.59  CALCIUM 8.9  < >  --   < > 8.7*  MG  --   --  1.8  --   --   AST 23  --   --   --   --   ALT 13*  --   --   --   --   ALKPHOS 136*  --   --   --   --   BILITOT 0.5  --   --   --   --   < > =  values in this interval not displayed. ------------------------------------------------------------------------------------------------------------------  Cardiac Enzymes  Recent Labs Lab 01/15/17 1604  TROPONINI <0.03   ------------------------------------------------------------------------------------------------------------------  RADIOLOGY:  Dg Chest 1 View  Result Date: 01/18/2017 CLINICAL DATA:  Status post left thoracentesis for pleural effusion. EXAM: CHEST 1 VIEW COMPARISON:  01/15/2017 FINDINGS: Stable enlargement of cardiac silhouette. No pneumothorax after left thoracentesis. Residual left lower lobe consolidation present. No pulmonary edema. Mild atelectasis at the right lung base. IMPRESSION: No pneumothorax after left thoracentesis. Underlying left lower lobe pulmonary consolidation present.  Electronically Signed   By: Irish Lack M.D.   On: 01/18/2017 09:40   Dg Chest 2 View  Result Date: 01/18/2017 CLINICAL DATA:  Recent thoracentesis EXAM: CHEST  2 VIEW COMPARISON:  01/18/2017 FINDINGS: Cardiomegaly. Small left pleural effusion noted. No pneumothorax following thoracentesis. Bibasilar opacities, left greater than right could reflect atelectasis or infiltrates. IMPRESSION: Small left pleural effusion. No pneumothorax. Continued bibasilar atelectasis or infiltrates, left greater than right. Cardiomegaly. Electronically Signed   By: Charlett Nose M.D.   On: 01/18/2017 15:03   Ct Head Wo Contrast  Result Date: 01/19/2017 CLINICAL DATA:  Altered level of consciousness EXAM: CT HEAD WITHOUT CONTRAST TECHNIQUE: Contiguous axial images were obtained from the base of the skull through the vertex without intravenous contrast. COMPARISON:  Head CT 08/24/2016 FINDINGS: Brain: No mass lesion, intraparenchymal hemorrhage or extra-axial collection. No evidence of acute cortical infarct. There is periventricular hypoattenuation compatible with chronic microvascular disease. Vascular: Atherosclerotic calcification of the vertebral and internal carotid arteries at the skull base. Skull: Normal visualized skull base, calvarium and extracranial soft tissues. Sinuses/Orbits: No sinus fluid levels or advanced mucosal thickening. No mastoid effusion. Normal orbits. IMPRESSION: Chronic ischemic microangiopathy without acute intracranial abnormality. Electronically Signed   By: Deatra Robinson M.D.   On: 01/19/2017 01:04   Dg Chest Port 1 View  Result Date: 01/18/2017 CLINICAL DATA:  Shortness of breath and confusion beginning 40 minutes ago EXAM: PORTABLE CHEST 1 VIEW COMPARISON:  Portable exam 2212 hours compared to 01/18/2017 at 1447 hours FINDINGS: Enlargement of cardiac silhouette. Mediastinal contours and pulmonary vascularity normal. Question atelectasis versus consolidation in retrocardiac LEFT lower  lobe. Remaining lungs clear. Cannot exclude small LEFT pleural effusion. No pneumothorax. Bones demineralized. IMPRESSION: Enlargement of cardiac silhouette. Atelectasis versus consolidation in retrocardiac LEFT lower lobe, cannot exclude small LEFT pleural effusion. Electronically Signed   By: Ulyses Southward M.D.   On: 01/18/2017 22:34   US Thoracentesis Asp Pleural Space W/img Guide  Result Date: 01/18/2017 CLINICAL DATA:  Left pleural effusion. EXAM: ULTRASOUND GUIDED LEFT THORACENTESIS COMPARISON:  Chest x-ray and CT of the chest on 01/15/2017 PROCEDURE: An ultrasound guided thoracentesis was thoroughly discussed with the patient's son and questions answered. The benefits, risks, alternatives and complications were also discussed. The patient's son understands and wishes to proceed with the procedure. Written consent was obtained. Ultrasound was performed to localize and mark an adequate pocket of fluid in the left chest. The area was then prepped and draped in the normal sterile fashion. 1% Lidocaine was used for local anesthesia. Under ultrasound guidance a 6 French Safe-T-Centesis catheter was introduced. Thoracentesis was performed. The catheter was removed and postprocedural ultrasound was performed. A dressing applied. COMPLICATIONS: None FINDINGS: Overall size of the left pleural effusion is small to moderate by ultrasound. A total of approximately 375 mL of amber colored fluid was removed. A fluid sample was sent for laboratory analysis. Postprocedural ultrasound shows significant diminishment in pleural effusion with a small amount  of pleural fluid remaining. IMPRESSION: Successful ultrasound guided left thoracentesis yielding 375 mL of pleural fluid. Electronically Signed   By: Irish Lack M.D.   On: 01/18/2017 09:44    EKG:   Orders placed or performed during the hospital encounter of 01/15/17  . ED EKG  . ED EKG  . EKG 12-Lead  . EKG 12-Lead    ASSESSMENT AND PLAN:    1. Altered  mental status- suspected to be secondary to metabolic encephalopathy from suspected pneumonia -Patient is lethargic today, she has received IV Benadryl last night since then patient is sedated; according to the son Patient is not allergic to Benadryl. We will avoid all sedatives including IV Benadryl -Clinically  slowly improving, according to the son patient is at her baseline -CT head is negative -Neurology consulted, will consult psychiatry if needed, son is agreeable -Continue treatment for underlying pneumonia with IV antibiotics Cefepime and vancomycin, will discontinue vancomycin as MRSA PCR is negative -Repeat chest x-ray With small left-sided pleural effusion  2. Pneumonia-suspected source of patient's altered mental status.  Patient clinically denies any shortness of breath fever or any other source respiratory symptoms.  CT chest and chest x-ray shows a total collapse of her left lower lobe with a pleural effusion.   for ultrasound-guided thoracentesis 01/19/17 continue empirically cefepime. Follow cultures if we can collect sputum sample Rrepeat chest x-ray   3. Pleural effusion-patient noted to have a large pleural effusion on the left sideOn CT chest  ultrasound-guided diagnostic/therapeutic thoracentesis done 01/18/17  Clinically patient is afebrile and Hemodynamically stable and not showing any signs of sepsis/empyema.  4. Pericardial effusion-this was incidentally noted on the CT chest. According to Dr. Welton Flakes patient has small to moderate pericardial effusion requiring conservative management,patient is asymptomatic   two-dimensional echocardiogram With moderate to large pericardial effusion   5. Hypothyroidism-continue Synthroid.  6. Depression-continue Abilify, Effexor..  7. Essential hypertension-continue Norvasc.  8. Hyperlipidemia-continue atorvastatin.  9. Neuropathy-continue Lyrica.     All the records are reviewed and case discussed with Care  Management/Social Workerr. Management plans discussed with the patient, son at bedside and they are in agreement.  CODE STATUS: fc   TOTAL TIME TAKING CARE OF THIS PATIENT: 33  minutes.   POSSIBLE D/C IN 2-3 DAYS, DEPENDING ON CLINICAL CONDITION.  Note: This dictation was prepared with Dragon dictation along with smaller phrase technology. Any transcriptional errors that result from this process are unintentional.   Ramonita Lab M.D on 01/19/2017 at 4:11 PM  Between 7am to 6pm - Pager - [phone] After 6pm go to [email or web address] - password [email or web address] [facility]  [place] [facility]  Office  [phone]  CC: Primary care physician; Center, [facility]

## 2017-01-19 NOTE — Progress Notes (Signed)
Date: 01/19/2017,   MRN# [id number] Jessica [name] 09-Feb-1946 Code Status:     Code Status Orders        Start     Ordered   01/15/17 2128  Full code  Continuous     01/15/17 2127    Code Status History    Date Active Date Inactive Code Status Order ID Comments User Context   01/01/2017  8:06 PM 01/03/2017  6:05 PM Full Code [id number]  Shaune Pollack, MD ED   12/19/2016  1:35 AM 12/20/2016  3:57 PM Full Code [id number]  Oralia Manis, MD Inpatient   09/12/2016  5:13 AM 09/13/2016  6:12 PM Full Code [id number]  Arnaldo Natal, MD Inpatient   08/14/2016  7:30 PM 08/15/2016  7:42 PM Full Code [id number]  Alford Highland, MD ED   07/06/2016  7:49 AM 07/07/2016  9:05 PM Full Code [id number]  Arnaldo Natal, MD Inpatient    Advance Directive Documentation     Most Recent Value  Type of Advance Directive  Healthcare Power of Attorney [Frankie Accardi]  Pre-existing out of facility DNR order (yellow form or pink MOST form)  -  "MOST" Form in Place?  -     Hosp day:@LENGTHOFSTAYDAYS @ Referring MD: @      CC: left  pleural effusion   HPI: This is a 71 year old lady with left pneumonia and left moderate size pleural effusion. Marland Kitchen   PMHX:   Past Medical History:  Diagnosis Date  . Chronic pain   . Depression   . HLD (hyperlipidemia)   . HTN (hypertension)   . Hypothyroidism   . Morbid obesity (HCC)   . Recurrent UTI   . Spinal stenosis   . Visual hallucinations    Surgical Hx:  Past Surgical History:  Procedure Laterality Date  . ABDOMINAL HYSTERECTOMY    . BACK SURGERY    . HEMORROIDECTOMY     Family Hx:  Family History  Problem Relation Age of Onset  . Heart disease Father   . Deep vein thrombosis Father    Social Hx:   Social History  Substance Use Topics  . Smoking status: Never Smoker  . Smokeless tobacco: Former Neurosurgeon    Types: Snuff  . Alcohol use No   Medication:    Home Medication:    Current Medication: @   Allergies:  Ambien [zolpidem  tartrate] and Amoxicillin  Review of Systems: Gen:  Denies  fever, sweats, chills HEENT: Denies blurred vision, double vision, ear pain, eye pain, hearing loss, nose bleeds, sore throat Cvc:  No dizziness, chest pain or heaviness Resp:    Gi: Denies swallowing difficulty, stomach pain, nausea or vomiting, diarrhea, constipation, bowel incontinence Gu:  Denies bladder incontinence, burning urine Ext:   No Joint pain, stiffness or swelling Skin: No skin rash, easy bruising or bleeding or hives Endoc:  No polyuria, polydipsia , polyphagia or weight change Psych: No depression, insomnia or hallucinations  Other:  All other systems negative  Physical Examination:   VS: BP 114/62 (BP Location: Left Arm)   Pulse 73   Temp 97.8 F (36.6 C) (Axillary)   Resp 14   Ht  (1.778 m)   Wt 205 lb (93 kg)   SpO2 99%   BMI 29.41 kg/m   General Appearance: No distress  Neuro: without focal findings, mental status, speech normal, alert and oriented, cranial nerves 2-12 intact, reflexes normal and symmetric, sensation grossly normal  HEENT: PERRLA, EOM intact, no  ptosis, no other lesions noticed, Mallampati: Pulmonary:.No wheezing, No rales  Sputum Production:   Cardiovascular:  Normal S1,S2.  No m/r/g.  Abdominal aorta pulsation normal.    Abdomen:Benign, Soft, non-tender, No masses, hepatosplenomegaly, No lymphadenopathy Endoc: No evident thyromegaly, no signs of acromegaly or Cushing features Skin:   warm, no rashes, no ecchymosis  Extremities: normal, no cyanosis, clubbing, no edema, warm with normal capillary refill. Other findings:   Labs results:   Recent Labs     01/18/17  0353  01/19/17  0453  01/19/17  1531  HGB  12.3   --   12.4  HCT  36.9   --   37.7  MCV  78.3*   --   78.4*  WBC  9.9   --   11.4*  BUN  15  16   --   CREATININE  0.59  0.59   --   GLUCOSE  102*  99   --   CALCIUM  8.7*  8.7*   --   INR  1.11   --    --   ,    No results for input(s): PH in the last  72 hours.  Invalid input(s): PCO2, PO2, BASEEXCESS, BASEDEFICITE, TFT  Culture results:     Rad results:   Ct Head Wo Contrast  Result Date: 01/19/2017 CLINICAL DATA:  Altered level of consciousness EXAM: CT HEAD WITHOUT CONTRAST TECHNIQUE: Contiguous axial images were obtained from the base of the skull through the vertex without intravenous contrast. COMPARISON:  Head CT 08/24/2016 FINDINGS: Brain: No mass lesion, intraparenchymal hemorrhage or extra-axial collection. No evidence of acute cortical infarct. There is periventricular hypoattenuation compatible with chronic microvascular disease. Vascular: Atherosclerotic calcification of the vertebral and internal carotid arteries at the skull base. Skull: Normal visualized skull base, calvarium and extracranial soft tissues. Sinuses/Orbits: No sinus fluid levels or advanced mucosal thickening. No mastoid effusion. Normal orbits. IMPRESSION: Chronic ischemic microangiopathy without acute intracranial abnormality. Electronically Signed   By: Deatra Robinson M.D.   On: 01/19/2017 01:04   Dg Chest Port 1 View  Result Date: 01/18/2017 CLINICAL DATA:  Shortness of breath and confusion beginning 40 minutes ago EXAM: PORTABLE CHEST 1 VIEW COMPARISON:  Portable exam 2212 hours compared to 01/18/2017 at 1447 hours FINDINGS: Enlargement of cardiac silhouette. Mediastinal contours and pulmonary vascularity normal. Question atelectasis versus consolidation in retrocardiac LEFT lower lobe. Remaining lungs clear. Cannot exclude small LEFT pleural effusion. No pneumothorax. Bones demineralized. IMPRESSION: Enlargement of cardiac silhouette. Atelectasis versus consolidation in retrocardiac LEFT lower lobe, cannot exclude small LEFT pleural effusion. Electronically Signed   By: Ulyses Southward M.D.   On: 01/18/2017 22:34   CLINICAL DATA:  Cough.  Altered mental status.  EXAM: CT CHEST WITH CONTRAST  TECHNIQUE: Multidetector CT imaging of the chest was performed  during intravenous contrast administration.  CONTRAST:  75mL ISOVUE-300 IOPAMIDOL (ISOVUE-300) INJECTION 61%  COMPARISON:  Two-view chest x-ray from the same day.  FINDINGS: Cardiovascular: A large pericardial effusion is present. The heart size is normal. Coronary artery calcifications are noted. Atherosclerotic changes are noted at the aortic arch and great vessel origins there of the present descending aorta. There is no aneurysm or focal stenosis.  Mediastinum/Nodes: No significant adenopathy is present. Gas is noted within the esophagus, likely within normal limits  Lungs/Pleura: Left greater than right pleural effusions are present. There is volume loss at the lung bases bilaterally. No significant consolidative airspace disease is present. Minimal atelectasis is noted posteriorly in the  left upper lobe as well. No significant nodule or mass lesion is present.  Upper Abdomen: Limited imaging the upper abdomen demonstrates atherosclerotic changes at the origins of the celiac and superior mesenteric artery is without significant stenosis. Solid organs are within normal limits.  Musculoskeletal: No focal lytic or blastic lesions are present. Mild rightward curvature is present in the thoracic spine. No focal lytic or blastic lesions are present.  IMPRESSION: 1. Large pericardial effusion. 2. Coronary artery calcifications. 3. Moderate left pleural effusion with near total collapse of the left lower lobe. 4. Small right pleural effusion with mild right basilar atelectasis. 5.  Aortic Atherosclerosis (ICD10-I70.0). Critical Value/emergent results were called by telephone at the time of interpretation on 01/15/2017 at 8:49 pm to Dr. Gladstone Pih , who verbally acknowledged these results.   Electronically Signed   By: Marin Roberts M.D.   On: 01/15/2017 20:51   FINDINGS: Overall size of the left pleural effusion is small to moderate by ultrasound.  A total of approximately 375 mL of amber colored fluid was removed. A fluid sample was sent for laboratory analysis. Postprocedural ultrasound shows significant diminishment in pleural effusion with a small amount of pleural fluid remaining.  IMPRESSION: Successful ultrasound guided left thoracentesis yielding 375 mL of pleural fluid.   Electronically Signed   By: Irish Lack M.D.   On: 01/18/2017 09:44     Total protein, fluid g/dL 3.8    LD, Fluid 3 - 23 U/L 173    Comment: (NOTE)    Glucose 99  Fluid Type-FCT  PLEURALVC   Comment: CORRECTED ON 10/08 AT 0934: PREVIOUSLY REPORTED AS CYTOPLEU  Color, Fluid YELLOW RED    Appearance, Fluid CLEAR CLOUDY    WBC, Fluid cu mm 1,281   Neutrophil Count, Fluid % 33   Lymphs, Fluid % 44   Monocyte-Macrophage-Serous Fluid % 23   Eos, Fluid % 0   Resulting Agency  [facility]     Assessment and Plan: Left pneumonia with para pneumonic effusion. Fluid is exudative. No frank puss -continue present antibiotic coverage -follow up on cultures of the pleural effusion -re tap if fluid re accumalates  Pericardial effusion, not in tamponade Medical management planned thus far      I have personally obtained a history, examined the patient, evaluated laboratory and imaging results, formulated the assessment and plan and placed orders.  The Patient requires high complexity decision making for assessment and support, frequent evaluation and titration of therapies, application of advanced monitoring technologies and extensive interpretation of multiple databases.   Herbon Fleming,M.D. Pulmonary & Critical care Medicine [facility]

## 2017-01-19 NOTE — Progress Notes (Signed)
Initial Nutrition Assessment  DOCUMENTATION CODES:   Obesity unspecified  INTERVENTION:  1. 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  GOAL:   Patient will meet greater than or equal to 90% of their needs  MONITOR:   PO intake, IAshmore's, Labs, Weight trends  REASON FOR ASSESSMENT:   Low Braden    ASSESSMENT:   Jessica [name] has a PMH of HTN, HLD, morbid obesity, spinal stenosis, recurrent UTIs,  Presents with AMS, PNA, pleural effusion, pericardial effusion  Spoke with patient's Son at bedside. Per previous notes, Son was frustrated with patient's AMS this morning, was short and brief with history. Patient was lethargic, drowsy due to benadryl. He stated she was eating well PTA for example: grits, green beans, chicken, etc. He denies any weight loss or issues chewing/swallowing/choking. NFPE WNL.  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 Meal Completion: 100% (No intake today)  Labs reviewed Medications reviewed and include:  Colace, MIralax, Senokot-S, B12, Vitamin D,   Diet Order:  Diet Heart Room service appropriate? Yes; Fluid consistency: Thin  Skin:  Wound (see comment) (Deep tissue injury to L ankle)  Last BM:  01/17/2017  Height:   Ht Readings from Last 1 Encounters:  01/15/17  (1.778 m)    Weight:   Wt Readings from Last 1 Encounters:  01/15/17 205 lb (93 kg)    Ideal Body Weight:  68.18 kg  BMI:  Body mass index is 29.41 kg/m.  Estimated Nutritional Needs:   Kcal:  1800-2000 calories (MSJ x1.2-1.3)  Protein:  112-130 grams  Fluid:  1.8-2L  EDUCATION NEEDS:   No education needs identified at this time  Dionne Ano. Willard Madrigal, MS, RD LDN Inpatient Clinical Dietitian Pager [phone]

## 2017-01-20 ENCOUNTER — Inpatient Hospital Stay: Payer: Medicare Other

## 2017-01-20 DIAGNOSIS — F05 Delirium due to known physiological condition: Secondary | ICD-10-CM

## 2017-01-20 DIAGNOSIS — F333 Major depressive disorder, recurrent, severe with psychotic symptoms: Secondary | ICD-10-CM

## 2017-01-20 DIAGNOSIS — R410 Disorientation, unspecified: Secondary | ICD-10-CM

## 2017-01-20 LAB — URINALYSIS, ROUTINE W REFLEX MICROSCOPIC
Bacteria, UA: NONE SEEN
Bilirubin Urine: NEGATIVE
Glucose, UA: NEGATIVE mg/dL
Ketones, ur: 5 mg/dL — AB
Nitrite: NEGATIVE
Protein, ur: 30 mg/dL — AB
RBC / HPF: NONE SEEN RBC/hpf (ref 0–5)
Specific Gravity, Urine: 1.019 (ref 1.005–1.030)
Squamous Epithelial / LPF: NONE SEEN
pH: 5 (ref 5.0–8.0)

## 2017-01-20 LAB — CULTURE, BLOOD (ROUTINE X 2)
Culture: NO GROWTH
Culture: NO GROWTH
Special Requests: ADEQUATE
Special Requests: ADEQUATE

## 2017-01-20 LAB — CBC
HCT: 34.9 % — ABNORMAL LOW (ref 35.0–47.0)
Hemoglobin: 11.5 g/dL — ABNORMAL LOW (ref 12.0–16.0)
MCH: 26.3 pg (ref 26.0–34.0)
MCHC: 33 g/dL (ref 32.0–36.0)
MCV: 79.8 fL — ABNORMAL LOW (ref 80.0–100.0)
Platelets: 311 10*3/uL (ref 150–440)
RBC: 4.37 MIL/uL (ref 3.80–5.20)
RDW: 14.9 % — ABNORMAL HIGH (ref 11.5–14.5)
WBC: 9.6 10*3/uL (ref 3.6–11.0)

## 2017-01-20 MED ORDER — OLANZAPINE 5 MG PO TBDP
5.0000 mg | ORAL_TABLET | Freq: Every day | ORAL | Status: DC
  Administered 2017-01-20 – 2017-01-22 (×2): 5 mg via ORAL
  Filled 2017-01-20 (×4): qty 1

## 2017-01-20 MED ORDER — VENLAFAXINE HCL ER 75 MG PO CP24
150.0000 mg | ORAL_CAPSULE | Freq: Every day | ORAL | Status: DC
  Administered 2017-01-21 – 2017-02-02 (×12): 150 mg via ORAL
  Filled 2017-01-20 (×13): qty 2

## 2017-01-20 MED ORDER — METOPROLOL TARTRATE 5 MG/5ML IV SOLN
2.5000 mg | Freq: Three times a day (TID) | INTRAVENOUS | Status: DC
Start: 2017-01-20 — End: 2017-01-21
  Administered 2017-01-20 – 2017-01-21 (×3): 2.5 mg via INTRAVENOUS
  Filled 2017-01-20 (×3): qty 5

## 2017-01-20 MED ORDER — MIRTAZAPINE 15 MG PO TBDP
7.5000 mg | ORAL_TABLET | Freq: Every day | ORAL | Status: DC
  Administered 2017-01-20: 7.5 mg via ORAL
  Filled 2017-01-20 (×2): qty 0.5

## 2017-01-20 MED ORDER — SODIUM CHLORIDE 0.9 % IV SOLN: INTRAVENOUS | Status: DC

## 2017-01-20 MED ORDER — VANCOMYCIN HCL IN DEXTROSE 1-5 GM/200ML-% IV SOLN
1000.0000 mg | Freq: Once | INTRAVENOUS | Status: AC
  Administered 2017-01-20: 1000 mg via INTRAVENOUS
  Filled 2017-01-20: qty 200

## 2017-01-20 MED ORDER — METOPROLOL SUCCINATE ER 25 MG PO TB24: 25.0000 mg | ORAL_TABLET | Freq: Every day | ORAL | Status: DC

## 2017-01-20 NOTE — Consult Note (Signed)
[facility] Consult   Reason for Consult:  Consult for 71 year old woman for depression Referring Physician:  Gouru Patient Identification: BRILYNN BIASI MRN:  [id number] Principal Diagnosis: Severe recurrent major depression with psychotic features [facility]) Diagnosis:   Patient Active Problem List   Diagnosis Date Noted  . Severe recurrent major depression with psychotic features ([place]) [F33.3] 01/20/2017  . Subacute delirium [F05] 01/20/2017  . Pneumonia [J18.9] 01/15/2017  . Sepsis ([place]) [A41.9] 01/01/2017  . Pressure injury of skin [L89.90] 12/19/2016  . UTI (urinary tract infection) [N39.0] 12/18/2016  . Tachycardia [R00.0] 09/12/2016  . Acute encephalopathy [G93.40] 08/14/2016  . Hallucinations [R44.3] 07/06/2016  . Hypokalemia [E87.6] 07/06/2016  . Sinus tachycardia [R00.0] 07/06/2016  . RECTAL PAIN [K62.89] 09/04/2009  . CONSTIPATION [K59.00] 07/08/2009  . 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 PALLOR [R23.1] 01/07/2009  . DIFFICULTY IN WALKING [R26.2] 11/22/2008  . ABDOMINAL OR PELVIC SWELLING MASS OR LUMP LLQ [R19.04] 10/01/2008  . LUMBAR RADICULOPATHY [IMO0002] 09/25/2008  . HLD (hyperlipidemia) [E78.5] 12/10/2006  . ANXIETY [F41.1] 12/10/2006  . DEPRESSION [F32.9] 12/10/2006  . Essential hypertension [I10] 12/10/2006  . OSTEOARTHRITIS [M19.90] 12/10/2006  . Hypothyroidism [E03.9] 09/22/2006  . HYPERCHOLESTEROLEMIA [E78.00] 09/22/2006  . ECZEMA [L25.9] 09/22/2006    Total Time spent with patient: 1 hour  Subjective:   SAIRA KRAMME is a 71 y.o. female patient admitted with "pneumonia".  HPI:  Patient interviewed. Chart reviewed. Patient's husband was bedside and also gave useful information. This is a 71 year old woman with multiple severe chronic and acute medical problems including history of paraplegia morbid obesity and now in the hospital with pneumonia. Concern about depression subjectively as well as objective  lack of motivation to improve. Found the patient to be awake and interactive despite what initially appeared to be a withdrawn state. She did engage in the interview and gave appropriate answers although usually in very few words. Patient says she is depressed. She says she is depressed and has been so for a long time but feels like it's getting worse. She tells me that she is hopeless and feels like things will never get any better. Furthermore she says she is not even looking forward to getting out of the hospital and having things get better. She says that she is not suicidal and is not trying to give up but does not feel like eating. Reports her sleep is been bad. Energy level is poor. Patient's answers regarding hallucinations were a little hard to interpret. Husband tells me that she has had some visual hallucinations during this hospital stay but had not appeared to be having any recently. Patient told me that she had seen a dog in her bed but says she is not seeing it currently. She is not currently seeing an outpatient psychiatrist but is on psychiatric medicine for depression prescribed by her medical providers.  Social history: Patient lives with her husband. Adult son also is present and I believe the adult son is the power of attorney.  Medical history: Multiple medical problems including spinal stenosis resulting in inability to walk, now with a pneumonia  Substance abuse history: Denies any current or past history of substance abuse nothing in the chart about substance abuse  Past Psychiatric History: I note that the patient has not seen a psychiatrist on any of her visits to the hospital although multiple visits to the emergency room have been prompted by visual hallucinations. Patient appears to be prone to getting delirious very easily. Has had  multiple visits for hallucinations and confusion that are attributed to delirium. Being admitted to a psychiatric hospital. Denies any past suicide  attempts. Denies any history of mania. Currently on Effexor extended release 225 mg a day and Abilify 2 mg per day  Risk to Self: Is patient at risk for suicide?: No Risk to Others:   Prior Inpatient Therapy:   Prior Outpatient Therapy:    Past Medical History:  Past Medical History:  Diagnosis Date  . Chronic pain   . Depression   . HLD (hyperlipidemia)   . HTN (hypertension)   . Hypothyroidism   . Morbid obesity ([place])   . Recurrent UTI   . Spinal stenosis   . Visual hallucinations     Past Surgical History:  Procedure Laterality Date  . ABDOMINAL HYSTERECTOMY    . BACK SURGERY    . HEMORROIDECTOMY     Family History:  Family History  Problem Relation Age of Onset  . Heart disease Father   . Deep vein thrombosis Father    Family Psychiatric  History: None is identified Social History:  History  Alcohol Use No     History  Drug Use No    Social History   Social History  . Marital status: Married    Spouse name: N/A  . Number of children: N/A  . Years of education: N/A   Social History Main Topics  . Smoking status: Never Smoker  . Smokeless tobacco: Former Systems developer    Types: Snuff  . Alcohol use No  . Drug use: No  . Sexual activity: Not Asked   Other Topics Concern  . None   Social History Narrative  . None   Additional Social History:    Allergies:   Allergies  Allergen Reactions  . Ambien [Zolpidem Tartrate] Other (See Comments)    Reaction: altered mental status  . Amoxicillin Other (See Comments)    Reaction: blisters in mouth Has patient had a PCN reaction causing immediate rash, facial/tongue/throat swelling, SOB or lightheadedness with hypotension: Yes Has patient had a PCN reaction causing severe rash involving mucus membranes or skin necrosis: No Has patient had a PCN reaction that required hospitalization No Has patient had a PCN reaction occurring within the last 10 years: Yes If all of the above answers are "NO", then may proceed  with Cephalosporin use.     Labs:  Results for orders placed or performed during the hospital encounter of 01/15/17 (from the past 48 hour(s))  Vancomycin, random     Status: None   Collection Time: 01/19/17  4:53 AM  Result Value Ref Range   Vancomycin Rm 16     Comment:        Random Vancomycin therapeutic range is dependent on dosage and time of specimen collection. A peak range is 20.0-40.0 ug/mL A trough range is 5.0-15.0 ug/mL          Basic metabolic panel     Status: Abnormal   Collection Time: 01/19/17  4:53 AM  Result Value Ref Range   Sodium 135 135 - 145 mmol/L   Potassium 3.9 3.5 - 5.1 mmol/L   Chloride 99 (L) 101 - 111 mmol/L   CO2 28 22 - 32 mmol/L   Glucose, Bld 99 65 - 99 mg/dL   BUN 16 6 - 20 mg/dL   Creatinine, Ser 0.59 0.44 - 1.00 mg/dL   Calcium 8.7 (L) 8.9 - 10.3 mg/dL   GFR calc non Af Amer >60 >60 mL/min  GFR calc Af Amer >60 >60 mL/min    Comment: (NOTE) The eGFR has been calculated using the CKD EPI equation. This calculation has not been validated in all clinical situations. eGFR's persistently <60 mL/min signify possible Chronic Kidney Disease.    Anion gap 8 5 - 15  CBC     Status: Abnormal   Collection Time: 01/19/17  3:31 PM  Result Value Ref Range   WBC 11.4 (H) 3.6 - 11.0 K/uL   RBC 4.81 3.80 - 5.20 MIL/uL   Hemoglobin 12.4 12.0 - 16.0 g/dL   HCT 37.7 35.0 - 47.0 %   MCV 78.4 (L) 80.0 - 100.0 fL   MCH 25.7 (L) 26.0 - 34.0 pg   MCHC 32.8 32.0 - 36.0 g/dL   RDW 14.8 (H) 11.5 - 14.5 %   Platelets 293 150 - 440 K/uL  Hepatic function panel     Status: Abnormal   Collection Time: 01/19/17  3:31 PM  Result Value Ref Range   Total Protein 6.3 (L) 6.5 - 8.1 g/dL   Albumin 2.1 (L) 3.5 - 5.0 g/dL   AST 18 15 - 41 U/L   ALT 11 (L) 14 - 54 U/L   Alkaline Phosphatase 119 38 - 126 U/L   Total Bilirubin 0.8 0.3 - 1.2 mg/dL   Bilirubin, Direct 0.2 0.1 - 0.5 mg/dL   Indirect Bilirubin 0.6 0.3 - 0.9 mg/dL  Comprehensive metabolic panel      Status: Abnormal   Collection Time: 01/19/17  3:31 PM  Result Value Ref Range   Sodium 135 135 - 145 mmol/L   Potassium 3.8 3.5 - 5.1 mmol/L   Chloride 100 (L) 101 - 111 mmol/L   CO2 26 22 - 32 mmol/L   Glucose, Bld 99 65 - 99 mg/dL   BUN 16 6 - 20 mg/dL   Creatinine, Ser 0.73 0.44 - 1.00 mg/dL   Calcium 8.8 (L) 8.9 - 10.3 mg/dL   Total Protein 6.3 (L) 6.5 - 8.1 g/dL   Albumin 2.1 (L) 3.5 - 5.0 g/dL   AST 18 15 - 41 U/L   ALT 10 (L) 14 - 54 U/L   Alkaline Phosphatase 123 38 - 126 U/L   Total Bilirubin 0.8 0.3 - 1.2 mg/dL   GFR calc non Af Amer >60 >60 mL/min   GFR calc Af Amer >60 >60 mL/min    Comment: (NOTE) The eGFR has been calculated using the CKD EPI equation. This calculation has not been validated in all clinical situations. eGFR's persistently <60 mL/min signify possible Chronic Kidney Disease.    Anion gap 9 5 - 15  CBC     Status: Abnormal   Collection Time: 01/20/17  3:17 AM  Result Value Ref Range   WBC 9.6 3.6 - 11.0 K/uL   RBC 4.37 3.80 - 5.20 MIL/uL   Hemoglobin 11.5 (L) 12.0 - 16.0 g/dL   HCT 34.9 (L) 35.0 - 47.0 %   MCV 79.8 (L) 80.0 - 100.0 fL   MCH 26.3 26.0 - 34.0 pg   MCHC 33.0 32.0 - 36.0 g/dL   RDW 14.9 (H) 11.5 - 14.5 %   Platelets 311 150 - 440 K/uL    Current Facility-Administered Medications  Medication Dose Route Frequency Provider Last Rate Last Dose  . 0.9 %  sodium chloride infusion   Intravenous Continuous Gouru, Aruna, MD      . acetaminophen (TYLENOL) tablet 650 mg  650 mg Oral Q6H PRN Henreitta Leber, MD  Or  . acetaminophen (TYLENOL) suppository 650 mg  650 mg Rectal Q6H PRN Henreitta Leber, MD      . albuterol (PROVENTIL) (2.5 MG/3ML) 0.083% nebulizer solution 3 mL  3 mL Inhalation Q4H PRN Henreitta Leber, MD   3 mL at 01/20/17 0828  . amLODipine (NORVASC) tablet 2.5 mg  2.5 mg Oral Daily Henreitta Leber, MD   2.5 mg at 01/18/17 1036   And  . atorvastatin (LIPITOR) tablet 10 mg  10 mg Oral Daily Henreitta Leber, MD   10  mg at 01/18/17 1040  . aspirin EC tablet 81 mg  81 mg Oral Daily Henreitta Leber, MD   81 mg at 01/18/17 1039  . baclofen (LIORESAL) tablet 10 mg  10 mg Oral q morning - 10a Gouru, Aruna, MD       And  . baclofen (LIORESAL) tablet 20 mg  20 mg Oral QHS Gouru, Aruna, MD      . ceFEPIme (MAXIPIME) 2 g in dextrose 5 % 50 mL IVPB  2 g Intravenous Q8H Henreitta Leber, MD 100 mL/hr at 01/20/17 1509 2 g at 01/20/17 1509  . docusate sodium (COLACE) capsule 100 mg  100 mg Oral BID Henreitta Leber, MD   100 mg at 01/18/17 2106  . enoxaparin (LOVENOX) injection 40 mg  40 mg Subcutaneous Q24H Henreitta Leber, MD   40 mg at 01/19/17 2223  . fluconazole (DIFLUCAN) tablet 200 mg  200 mg Oral Daily Henreitta Leber, MD   200 mg at 01/18/17 1036  . fluticasone (FLONASE) 50 MCG/ACT nasal spray 2 spray  2 spray Each Nare Daily PRN Henreitta Leber, MD      . furosemide (LASIX) tablet 20 mg  20 mg Oral Daily Henreitta Leber, MD   20 mg at 01/18/17 1041  . levothyroxine (SYNTHROID, LEVOTHROID) tablet 150 mcg  150 mcg Oral QAC breakfast Henreitta Leber, MD   150 mcg at 01/18/17 1034  . liver oil-zinc oxide (DESITIN) 40 % ointment 1 application  1 application Topical PRN Sainani, Belia Heman, MD      . metoprolol tartrate (LOPRESSOR) injection 2.5 mg  2.5 mg Intravenous Q8H Jake Bathe, FNP   2.5 mg at 01/20/17 1322  . mirtazapine (REMERON SOL-TAB) disintegrating tablet 7.5 mg  7.5 mg Oral QHS Clapacs, John T, MD      . mupirocin ointment (BACTROBAN) 2 % 1 application  1 application Nasal BID Henreitta Leber, MD   1 application at 62/94/76 1324  . nystatin cream (MYCOSTATIN) 1 application  1 application Topical BID Henreitta Leber, MD   1 application at 54/65/03 1324  . OLANZapine zydis (ZYPREXA) disintegrating tablet 5 mg  5 mg Oral QHS Clapacs, John T, MD      . ondansetron (ZOFRAN) tablet 4 mg  4 mg Oral Q6H PRN Henreitta Leber, MD       Or  . ondansetron (ZOFRAN) injection 4 mg  4 mg Intravenous Q6H  PRN Sainani, Belia Heman, MD      . polyethylene glycol (MIRALAX / GLYCOLAX) packet 17 g  17 g Oral Daily Henreitta Leber, MD   17 g at 01/16/17 0943  . pregabalin (LYRICA) capsule 150 mg  150 mg Oral TID Henreitta Leber, MD   150 mg at 01/18/17 2105  . senna-docusate (Senokot-S) tablet 2 tablet  2 tablet Oral Daily Henreitta Leber, MD   2 tablet at 01/18/17 1041  .  traZODone (DESYREL) tablet 50 mg  50 mg Oral QHS Henreitta Leber, MD   50 mg at 01/18/17 2106  . [START ON 01/21/2017] venlafaxine XR (EFFEXOR-XR) 24 hr capsule 150 mg  150 mg Oral Daily Clapacs, John T, MD      . vitamin B-12 (CYANOCOBALAMIN) tablet 1,000 mcg  1,000 mcg Oral Daily Henreitta Leber, MD   1,000 mcg at 01/18/17 1041  . Vitamin D (Ergocalciferol) (DRISDOL) capsule 50,000 Units  50,000 Units Oral Q30 days Henreitta Leber, MD   50,000 Units at 01/16/17 0945    Musculoskeletal: Strength & Muscle Tone: decreased Gait & Station: unable to stand Patient leans: N/A  Psychiatric Specialty Exam: Physical Exam  Nursing note and vitals reviewed. Constitutional: She appears well-developed and well-nourished. She appears distressed.  HENT:  Head: Normocephalic and atraumatic.  Eyes: Pupils are equal, round, and reactive to light. Conjunctivae are normal.  Neck: Normal range of motion.  Cardiovascular: Normal heart sounds.   Respiratory: She is in respiratory distress.  GI: Soft.  Musculoskeletal: Normal range of motion.  Neurological: She is alert.  Patient is paraplegic with minimal use of lower extremities  Skin: Skin is warm and dry.  Psychiatric: Her affect is blunt. Her speech is delayed. She is slowed and withdrawn. Cognition and memory are impaired. She exhibits a depressed mood. She expresses no suicidal ideation. She exhibits abnormal recent memory.    Review of Systems  Constitutional: Negative.   HENT: Negative.   Eyes: Negative.   Respiratory: Negative.   Cardiovascular: Negative.   Gastrointestinal:  Negative.   Musculoskeletal: Negative.   Skin: Negative.   Neurological: Negative.   Psychiatric/Behavioral: Positive for depression, hallucinations and memory loss. Negative for substance abuse and suicidal ideas. The patient is nervous/anxious and has insomnia.     Blood pressure 129/63, pulse (!) 122, temperature 99.2 F (37.3 C), temperature source Oral, resp. rate 18, height _0  (1.778 m), weight 93 kg (205 lb), SpO2 95 %.Body mass index is 29.41 kg/m.  General Appearance: Casual  Eye Contact:  None  Speech:  Slow  Volume:  Decreased  Mood:  Depressed  Affect:  Congruent  Thought Process:  Goal Directed  Orientation:  Full (Time, Place, and Person)  Thought Content:  Thought content was very concrete. Answered questions in minimum number of words necessary. Did not appear to be paranoid. Unclear whether she was having hallucinations although she was not behaving as though she was currently.  Suicidal Thoughts:  No  Homicidal Thoughts:  No  Memory:  Immediate;   Fair Recent;   Poor Remote;   Fair  Judgement:  Impaired  Insight:  Shallow  Psychomotor Activity:  Decreased  Concentration:  Concentration: Fair  Recall:  [employer] of Knowledge:  Fair  Language:  Fair  Akathisia:  No  Handed:  Right  AIMS (if indicated):     Assets:  Financial Resources/Insurance Housing Social Support  ADL's:  Impaired  Cognition:  Impaired,  Mild  Sleep:        Treatment Plan Summary: Daily contact with patient to assess and evaluate symptoms and progress in treatment, Medication management and Plan 71 year old woman with multiple medical problems and a history of delirium currently presents as extremely withdrawn. Flat affect. Made no eye contact during the interview. Nevertheless she did answer questions appropriately and endorsed multiple symptoms of depression although she denies any suicidal thoughts. Endorses hopelessness. Despite multiple attempts however I could not get her to  spontaneously  speak at length about any of her feelings. She tends to answer questions in as few words as possible. Does not at this time appear to be delirious. She did interact in a reasonably appropriate manner and stayed on topic. Patient appears to have severe depression along with all of her other medical problems. Current medications clearly not working. I proposed to her that we make some changes to her medicine. Discontinue the Abilify. Start Zyprexa 5 mg at night. This can help with sleep and appetite as well as probably being a stronger antipsychotic and also helpful in treating psychotic depression. Start to taper down the Effexor which is already at a fairly high dose and not working. Decreased dose 250 mg at night. I will try switching her if possible to mirtazapine. Start with a very low dose of 7.5 mg at night along with the Zyprexa. Explained plan to patient and husband. I will continue to follow-up as needed.  Disposition: Patient does not meet criteria for psychiatric inpatient admission. Supportive therapy provided about ongoing stressors.  Alethia Berthold, MD 01/20/2017 9:17 PM

## 2017-01-20 NOTE — Progress Notes (Signed)
[facility] [place] [facility]   [name]    MR#:  [id number]  DATE OF BIRTH:  06/29/1945  SUBJECTIVE:  CHIEF COMPLAINT:  Patient is chronically bedbound , has Michiel Sites lift at home. Patient is coughing intermittently, mentating at her base line today  REVIEW OF SYSTEMS:  Review of systems unobtainable as the patient is lethargic  DRUG ALLERGIES:   Allergies  Allergen Reactions  . Ambien [Zolpidem Tartrate] Other (See Comments)    Reaction: altered mental status  . Amoxicillin Other (See Comments)    Reaction: blisters in mouth Has patient had a PCN reaction causing immediate rash, facial/tongue/throat swelling, SOB or lightheadedness with hypotension: Yes Has patient had a PCN reaction causing severe rash involving mucus membranes or skin necrosis: No Has patient had a PCN reaction that required hospitalization No Has patient had a PCN reaction occurring within the last 10 years: Yes If all of the above answers are "NO", then may proceed with Cephalosporin use.     VITALS:  Blood pressure (!) 134/55, pulse 100, temperature 98.9 F (37.2 C), temperature source Axillary, resp. rate 18, height  (1.778 m), weight 93 kg (205 lb), SpO2 95 %.  PHYSICAL EXAMINATION:  GENERAL:  71 y.o.-year-old patient lying in the bed with no acute distress.  EYES: Pupils equal, round, reactive to light and accommodation. No scleral icterus. Extraocular muscles intact.  HEENT: Head atraumatic, normocephalic. Oropharynx and nasopharynx clear.  NECK:  Supple, no jugular venous distention. No thyroid enlargement, no tenderness.  LUNGS: Diminished breath sounds on the left lower lobe, moderate breath sounds bilaterally, no wheezing, rales,rhonchi or crepitation. No use of accessory muscles of respiration.  CARDIOVASCULAR: S1, S2 normal. No murmurs, rubs, or gallops.  ABDOMEN: Soft, nontender, nondistended. Bowel sounds present.  EXTREMITIES: 1+  dependent pedal edema, no cyanosis, or clubbing.  NEUROLOGIC: Chronically bedbound from lower extremity weakness ; arousable but falling asleep PSYCHIATRIC: The patient is lethargic  SKIN: Lower back-erythema is present but no ulcers   LABORATORY PANEL:   CBC  Recent Labs Lab 01/20/17 0317  WBC 9.6  HGB 11.5*  HCT 34.9*  PLT 311   ------------------------------------------------------------------------------------------------------------------  Chemistries   Recent Labs Lab 01/17/17 0937  01/19/17 1531  NA  --   < > 135  K 3.4*  < > 3.8  CL  --   < > 100*  CO2  --   < > 26  GLUCOSE  --   < > 99  BUN  --   < > 16  CREATININE  --   < > 0.73  CALCIUM  --   < > 8.8*  MG 1.8  --   --   AST  --   --  18  18  ALT  --   --  10*  11*  ALKPHOS  --   --  123  119  BILITOT  --   --  0.8  0.8  < > = values in this interval not displayed. ------------------------------------------------------------------------------------------------------------------  Cardiac Enzymes  Recent Labs Lab 01/15/17 1604  TROPONINI <0.03   ------------------------------------------------------------------------------------------------------------------  RADIOLOGY:  Dg Chest 2 View  Result Date: 01/20/2017 CLINICAL DATA:  Shortness of breath, lethargy. EXAM: CHEST  2 VIEW COMPARISON:  Portable chest x-ray of January 18, 2017 FINDINGS: The right lung is adequately inflated and clear. On the left there is persistent volume loss involving the mid and lower lung. The left hemidiaphragm remains obscured. The cardiac silhouette remains  enlarged. The pulmonary vascularity is not clearly engorged. IMPRESSION: Chronic bronchitic changes, stable. Persistent and perhaps increased density in the left mid and lower lung consistent with atelectasis or pneumonia with probable left pleural effusion. Stable cardiomegaly without pulmonary vascular congestion. Electronically Signed   By: David  [place]il[place] M.D.   On:  01/20/2017 09:49   Ct Head Wo Contrast  Result Date: 01/19/2017 CLINICAL DATA:  Altered level of consciousness EXAM: CT HEAD WITHOUT CONTRAST TECHNIQUE: Contiguous axial images were obtained from the base of the skull through the vertex without intravenous contrast. COMPARISON:  Head CT 08/24/2016 FINDINGS: Brain: No mass lesion, intraparenchymal hemorrhage or extra-axial collection. No evidence of acute cortical infarct. There is periventricular hypoattenuation compatible with chronic microvascular disease. Vascular: Atherosclerotic calcification of the vertebral and internal carotid arteries at the skull base. Skull: Normal visualized skull base, calvarium and extracranial soft tissues. Sinuses/Orbits: No sinus fluid levels or advanced mucosal thickening. No mastoid effusion. Normal orbits. IMPRESSION: Chronic ischemic microangiopathy without acute intracranial abnormality. Electronically Signed   By: Deatra Robinson M.D.   On: 01/19/2017 01:04   Dg Chest Port 1 View  Result Date: 01/18/2017 CLINICAL DATA:  Shortness of breath and confusion beginning 40 minutes ago EXAM: PORTABLE CHEST 1 VIEW COMPARISON:  Portable exam 2212 hours compared to 01/18/2017 at 1447 hours FINDINGS: Enlargement of cardiac silhouette. Mediastinal contours and pulmonary vascularity normal. Question atelectasis versus consolidation in retrocardiac LEFT lower lobe. Remaining lungs clear. Cannot exclude small LEFT pleural effusion. No pneumothorax. Bones demineralized. IMPRESSION: Enlargement of cardiac silhouette. Atelectasis versus consolidation in retrocardiac LEFT lower lobe, cannot exclude small LEFT pleural effusion. Electronically Signed   By: Ulyses Southward M.D.   On: 01/18/2017 22:34    EKG:   Orders placed or performed during the hospital encounter of 01/15/17  . ED EKG  . ED EKG  . EKG 12-Lead  . EKG 12-Lead    ASSESSMENT AND PLAN:    1. Altered mental status- secondary to metabolic encephalopathy from  pneumonia -Patient is Almost back to her baseline  We will avoid all sedatives including IV Benadryl -Clinically  slowly improving, according to the son patient is at her baseline -CT head is negative -Neurology consulted, as patient's clinical condition is improving not considering MRI at this time -Continue treatment for underlying pneumonia with IV antibiotics Cefepime and vancomycin, will discontinue vancomycin as MRSA PCR is negative -Repeat chest x-ray With small left-sided pleural effusion, consolidation in the left lower lobe, not worsening  2. Pneumonia- source of patient's altered mental status.  Patient clinically denies any shortness of breath fever or any other source respiratory symptoms.  CT chest and chest x-ray shows a total collapse of her left lower lobe with a pleural effusion.   for ultrasound-guided thoracentesis 01/19/17 continue empirically cefepime and vancomycin. Follow cultures if we can collect sputum sample Consider ID consult if no improvement IS  3. Pleural effusion-patient noted to have a large pleural effusion on the left sideOn CT chest  ultrasound-guided diagnostic/therapeutic thoracentesis done 01/18/17  Clinically patient is afebrile and Hemodynamically stable and not showing any signs of sepsis/empyema.  4. Pericardial effusion-this was incidentally noted on the CT chest. According to Dr. Welton Flakes patient has small to moderate pericardial effusion requiring conservative management,patient is asymptomatic   two-dimensional echocardiogram With moderate to large pericardial effusion   5. Hypothyroidism-continue Synthroid.  6. Depression-continue Abilify, Effexor..  7. Essential hypertension-continue Norvasc.  8. Hyperlipidemia-continue atorvastatin.  9. Neuropathy-continue Lyrica.  10. Poor by mouth intake Gentle  hydration with IV fluids, dietitian is following. Frequent small feeds  encouraged     All the records are reviewed and case  discussed with Care Management/Social Workerr. Management plans discussed with the patient, son at bedside and they are in agreement.  CODE STATUS: fc   TOTAL TIME TAKING CARE OF THIS PATIENT: 33  minutes.   POSSIBLE D/C IN 2-3 DAYS, DEPENDING ON CLINICAL CONDITION.  Note: This dictation was prepared with Dragon dictation along with smaller phrase technology. Any transcriptional errors that result from this process are unintentional.   Ramonita Lab M.D on 01/20/2017 at 3:43 PM  Between 7am to 6pm - Pager - [phone] After 6pm go to [email or web address] - password [email or web address] [facility]  [facility]  Office  [phone]  CC: Primary care physician; Center, [facility]

## 2017-01-20 NOTE — Progress Notes (Signed)
SUBJECTIVE: Pt denies chest pain. She is largely unresponsive although appears to be awake.   Vitals:   01/19/17 2030 01/19/17 2316 01/20/17 0745 01/20/17 0831  BP: 122/70 (!) 126/57 (!) 134/55   Pulse: (!) 110 89 100   Resp: Temp: 97.7 F (36.5 C) 98.4 F (36.9 C) 98.9 F (37.2 C)   TempSrc: Oral Oral Axillary   SpO2: 97% 96% 93% 95%  Weight:      Height:        Intake/Output Summary (Last 24 hours) at 01/20/17 1128 Last data filed at 01/20/17 0435  Gross per 24 hour  Intake              290 ml  Output              900 ml  Net             -610 ml    LABS: Basic Metabolic Panel:  Recent Labs  08/65/78 0453 01/19/17 1531  NA 135 135  K 3.9 3.8  CL 99* 100*  CO2 28 26  GLUCOSE 99 99  BUN 16 16  CREATININE 0.59 0.73  CALCIUM 8.7* 8.8*   Liver Function Tests:  Recent Labs  01/19/17 1531  AST 18  18  ALT 10*  11*  ALKPHOS 123  119  BILITOT 0.8  0.8  PROT 6.3*  6.3*  ALBUMIN 2.1*  2.1*   No results for input(s): LIPASE, AMYLASE in the last 72 hours. CBC:  Recent Labs  01/18/17 0353 01/19/17 1531 01/20/17 0317  WBC 9.9 11.4* 9.6  NEUTROABS 6.6*  --   --   HGB 12.3 12.4 11.5*  HCT 36.9 37.7 34.9*  MCV 78.3* 78.4* 79.8*  PLT 337 293 311   Cardiac Enzymes: No results for input(s): CKTOTAL, CKMB, CKMBINDEX, TROPONINI in the last 72 hours. BNP: Invalid input(s): POCBNP D-Dimer: No results for input(s): DDIMER in the last 72 hours. Hemoglobin A1C: No results for input(s): HGBA1C in the last 72 hours. Fasting Lipid Panel: No results for input(s): CHOL, HDL, LDLCALC, TRIG, CHOLHDL, LDLDIRECT in the last 72 hours. Thyroid Function Tests: No results for input(s): TSH, T4TOTAL, T3FREE, THYROIDAB in the last 72 hours.  Invalid input(s): FREET3 Anemia Panel: No results for input(s): VITAMINB12, FOLATE, FERRITIN, TIBC, IRON, RETICCTPCT in the last 72 hours.   PHYSICAL EXAM General: Well developed, well nourished, in no acute  distress HEENT:  Normocephalic and atramatic Neck:  No JVD.  Lungs: Clear bilaterally to auscultation and percussion. Heart: HRRR . Normal S1 and S2 without gallops or murmurs.  Abdomen: Bowel sounds are positive, abdomen soft and non-tender  Msk:  Back normal, normal gait. Normal strength and tone for age. Extremities: No clubbing, cyanosis or edema.   Neuro: Largely unresponsive Psych:  Flat affect, barely responds to questions. Does not make eye contact or notice provider.  TELEMETRY: SR 95-105 bpm  ASSESSMENT AND PLAN: Pneumonia with mild-moderate pericardial effusion. Blood pressure and heart rate are borderline elevated. Continue to treat pericardial effusion conservatively. Started low dose metoprolol for mild heart rate elevation. Will continue to follow.   Active Problems:   Pneumonia    Caroleen Hamman, NP-C 01/20/2017 11:28 AM

## 2017-01-20 NOTE — Progress Notes (Signed)
Patient is refusing to eat or take meds. Restarted IV fluids. Tele in place. Patient able to answer questions when she wants to. Coughing up clear sputum, permitted 1 neb treatments today. Repositioned Q2H. Ext cath in place. Family at bedside. On 2L Oxygen, acute. Reinforced with family the need for her to eat, included ensure shakes. Patient is A&O x4. IV site in LFA.

## 2017-01-20 NOTE — Progress Notes (Signed)
Subjective: Patient improved today.  More alert.  Responding to questioning and following commands.  Complains of SOB.  Objective: Current vital signs: BP (!) 126/57 (BP Location: Right Arm)   Pulse 89   Temp 98.4 F (36.9 C) (Oral)   Resp 18   Ht  (1.778 m)   Wt 93 kg (205 lb)   SpO2 95%   BMI 29.41 kg/m  Vital signs in last 24 hours: Temp:  [97.5 F (36.4 C)-98.4 F (36.9 C)] 98.4 F (36.9 C) (10/09 2316) Pulse Rate:  [83-110] 89 (10/09 2316) Resp:  [16-19] 18 (10/09 2316) BP: (122-139)/(57-71) 126/57 (10/09 2316) SpO2:  [95 %-100 %] 95 % (10/10 0831)  Intake/Output from previous day: 10/09 0701 - 10/10 0700 In: 290 [P.O.:240; IV Piggyback:50] Out: 900 [Urine:900] Intake/Output this shift: No intake/output data recorded. Nutritional status: Diet Heart Room service appropriate? Yes; Fluid consistency: Thin  Neurologic Exam: Mental Status: Awake and alert.  Responds slowly but appropriately to questioning.  Follows commands.  Flat affect.   Cranial Nerves: II: Discs flat bilaterally; Blinks to bilateral confrontation, pupils equal, round, reactive to light and accommodation III,IV, VI: ptosis not present, intact oculocephalic maneuvers V,VII: intact corneals VIII: hearing normal bilaterally IX,X: unable to test XI: unable to test XII: unable to test Motor: Lifts both upper extremities off the bed and gives equal, weak grips.   Lab Results: Basic Metabolic Panel:  Recent Labs Lab 01/15/17 1604 01/16/17 0458 01/17/17 0937 01/18/17 0353 01/19/17 0453 01/19/17 1531  NA 138 136  --  137 135 135  K 3.5 3.0* 3.4* 3.0* 3.9 3.8  CL 97* 99*  --  98* 99* 100*  CO2 30 29  --  GLUCOSE 92 95  --  102* 99 99  BUN 11 11  --  CREATININE 0.72 0.71  --  0.59 0.59 0.73  CALCIUM 8.9 8.4*  --  8.7* 8.7* 8.8*  MG  --   --  1.8  --   --   --     Liver Function Tests:  Recent Labs Lab 01/15/17 1604 01/19/17 1531  AST ALT 13*  10*  11*  ALKPHOS 136* 123  119  BILITOT 0.5 0.8  0.8  PROT 6.9 6.3*  6.3*  ALBUMIN 2.5* 2.1*  2.1*   No results for input(s): LIPASE, AMYLASE in the last 168 hours. No results for input(s): AMMONIA in the last 168 hours.  CBC:  Recent Labs Lab 01/15/17 1604 01/16/17 0458 01/18/17 0353 01/19/17 1531 01/20/17 0317  WBC 10.1 9.4 9.9 11.4* 9.6  NEUTROABS 7.2*  --  6.6*  --   --   HGB 13.1 11.7* 12.3 12.4 11.5*  HCT 39.5 34.8* 36.9 37.7 34.9*  MCV 79.6* 78.3* 78.3* 78.4* 79.8*  PLT 337 311 337 293 311    Cardiac Enzymes:  Recent Labs Lab 01/15/17 1604  TROPONINI <0.03    Lipid Panel: No results for input(s): CHOL, TRIG, HDL, CHOLHDL, VLDL, LDLCALC in the last 168 hours.  CBG: No results for input(s): GLUCAP in the last 168 hours.  Microbiology: Results for orders placed or performed during the hospital encounter of 01/15/17  Blood culture (routine x 2)     Status: None   Collection Time: 01/15/17  4:04 PM  Result Value Ref Range Status   Specimen Description BLOOD BLOOD LEFT ARM  Final   Special Requests   Final    BOTTLES DRAWN  AEROBIC AND ANAEROBIC Blood Culture adequate volume   Culture NO GROWTH 5 DAYS  Final   Report Status 01/20/2017 FINAL  Final  Blood culture (routine x 2)     Status: None   Collection Time: 01/15/17  4:05 PM  Result Value Ref Range Status   Specimen Description BLOOD LEFT ANTECUBITAL  Final   Special Requests   Final    BOTTLES DRAWN AEROBIC AND ANAEROBIC Blood Culture adequate volume   Culture NO GROWTH 5 DAYS  Final   Report Status 01/20/2017 FINAL  Final  Urine Culture     Status: Abnormal   Collection Time: 01/15/17  8:51 PM  Result Value Ref Range Status   Specimen Description URINE, CATHETERIZED  Final   Special Requests NONE  Final   Culture >=100,000 COLONIES/mL YEAST (A)  Final   Report Status 01/17/2017 FINAL  Final  Body fluid culture     Status: None (Preliminary result)   Collection Time: 01/18/17  9:10 AM   Result Value Ref Range Status   Specimen Description PLEURAL  Final   Special Requests NONE  Final   Gram Stain   Final    ABUNDANT WBC PRESENT,BOTH PMN AND MONONUCLEAR NO ORGANISMS SEEN    Culture   Final    NO GROWTH 2 DAYS Performed at [facility], [address], [place], [place] [address]    Report Status PENDING  Incomplete  MRSA PCR Screening     Status: None   Collection Time: 01/18/17  2:15 PM  Result Value Ref Range Status   MRSA by PCR NEGATIVE NEGATIVE Final    Comment:        The GeneXpert MRSA Assay (FDA approved for NASAL specimens only), is one component of a comprehensive MRSA colonization surveillance program. It is not intended to diagnose MRSA infection nor to guide or monitor treatment for MRSA infections.     Coagulation Studies:  Recent Labs  01/18/17 0353  LABPROT 14.2  INR 1.11    Imaging: Dg Chest 2 View  Result Date: 01/20/2017 CLINICAL DATA:  Shortness of breath, lethargy. EXAM: CHEST  2 VIEW COMPARISON:  Portable chest x-ray of January 18, 2017 FINDINGS: The right lung is adequately inflated and clear. On the left there is persistent volume loss involving the mid and lower lung. The left hemidiaphragm remains obscured. The cardiac silhouette remains enlarged. The pulmonary vascularity is not clearly engorged. IMPRESSION: Chronic bronchitic changes, stable. Persistent and perhaps increased density in the left mid and lower lung consistent with atelectasis or pneumonia with probable left pleural effusion. Stable cardiomegaly without pulmonary vascular congestion. Electronically Signed   By: David  [place]azil[place] M.D.   On: 01/20/2017 09:49   Dg Chest 2 View  Result Date: 01/18/2017 CLINICAL DATA:  Recent thoracentesis EXAM: CHEST  2 VIEW COMPARISON:  01/18/2017 FINDINGS: Cardiomegaly. Small left pleural effusion noted. No pneumothorax following thoracentesis. Bibasilar opacities, left greater than right could reflect atelectasis or infiltrates.  IMPRESSION: Small left pleural effusion. No pneumothorax. Continued bibasilar atelectasis or infiltrates, left greater than right. Cardiomegaly. Electronically Signed   By: Charlett Nose M.D.   On: 01/18/2017 15:03   Ct Head Wo Contrast  Result Date: 01/19/2017 CLINICAL DATA:  Altered level of consciousness EXAM: CT HEAD WITHOUT CONTRAST TECHNIQUE: Contiguous axial images were obtained from the base of the skull through the vertex without intravenous contrast. COMPARISON:  Head CT 08/24/2016 FINDINGS: Brain: No mass lesion, intraparenchymal hemorrhage or extra-axial collection. No evidence of acute cortical  infarct. There is periventricular hypoattenuation compatible with chronic microvascular disease. Vascular: Atherosclerotic calcification of the vertebral and internal carotid arteries at the skull base. Skull: Normal visualized skull base, calvarium and extracranial soft tissues. Sinuses/Orbits: No sinus fluid levels or advanced mucosal thickening. No mastoid effusion. Normal orbits. IMPRESSION: Chronic ischemic microangiopathy without acute intracranial abnormality. Electronically Signed   By: Deatra Robinson M.D.   On: 01/19/2017 01:04   Dg Chest Port 1 View  Result Date: 01/18/2017 CLINICAL DATA:  Shortness of breath and confusion beginning 40 minutes ago EXAM: PORTABLE CHEST 1 VIEW COMPARISON:  Portable exam 2212 hours compared to 01/18/2017 at 1447 hours FINDINGS: Enlargement of cardiac silhouette. Mediastinal contours and pulmonary vascularity normal. Question atelectasis versus consolidation in retrocardiac LEFT lower lobe. Remaining lungs clear. Cannot exclude small LEFT pleural effusion. No pneumothorax. Bones demineralized. IMPRESSION: Enlargement of cardiac silhouette. Atelectasis versus consolidation in retrocardiac LEFT lower lobe, cannot exclude small LEFT pleural effusion. Electronically Signed   By: Ulyses Southward M.D.   On: 01/18/2017 22:34    Medications:  I have reviewed the patient's  current medications. Scheduled: . amLODipine  2.5 mg Oral Daily   And  . atorvastatin  10 mg Oral Daily  . ARIPiprazole  2 mg Oral Daily  . aspirin EC  81 mg Oral Daily  . baclofen  10 mg Oral q morning - 10a   And  . baclofen  20 mg Oral QHS  . docusate sodium  100 mg Oral BID  . enoxaparin (LOVENOX) injection  40 mg Subcutaneous Q24H  . fluconazole  200 mg Oral Daily  . furosemide  20 mg Oral Daily  . levothyroxine  150 mcg Oral QAC breakfast  . mupirocin ointment  1 application Nasal BID  . nystatin cream  1 application Topical BID  . polyethylene glycol  17 g Oral Daily  . pregabalin  150 mg Oral TID  . senna-docusate  2 tablet Oral Daily  . traZODone  50 mg Oral QHS  . venlafaxine XR  225 mg Oral Daily  . vitamin B-12  1,000 mcg Oral Daily  . Vitamin D (Ergocalciferol)  50,000 Units Oral Q30 days    Assessment/Plan: Patient much improved.  Lab work unremarkable.    Recommendations: 1.  MRI not indicated at this time.  Will continue to follow prn.    LOS: 5 days   Thana Farr, MD Neurology [phone] 01/20/2017  10:39 AM

## 2017-01-20 NOTE — Progress Notes (Signed)
Pharmacy Antibiotic Note  Jessica [name] is a 71 y.o. female admitted on 01/15/2017 with pneumonia.  Pharmacy has been consulted for Vancomycin and Cefepime dosing.  Plan: MD would like to restart Vancomycin therapy since patient clinical picture has not improved. Will redose Vancomycin @ 1 g IV x 1 dose due to previously elevated levels. Will recheck Vancomycin level ~24 hours post dose. If levels within goal range, consider restarting Vancomycin regimen    Height:  (177.8 cm) Weight: 205 lb (93 kg) IBW/kg (Calculated) : 68.5  Temp (24hrs), Avg:98.1 F (36.7 C), Min:97.5 F (36.4 C), Max:98.9 F (37.2 C)   Recent Labs Lab 01/15/17 1604 01/15/17 1605 01/16/17 0458 01/17/17 1438 01/18/17 0353 01/19/17 0453 01/19/17 1531 01/20/17 0317  WBC 10.1  --  9.4  --  9.9  --  11.4* 9.6  CREATININE 0.72  --  0.71  --  0.59 0.59 0.73  --   LATICACIDVEN  --  1.5  --   --   --   --   --   --   VANCOTROUGH  --   --   --  32*  --   --   --   --   VANCORANDOM  --   --   --   --  28 16  --   --     Estimated Creatinine Clearance: 82 mL/min (by C-G formula based on SCr of 0.73 mg/dL).    Allergies  Allergen Reactions  . Ambien [Zolpidem Tartrate] Other (See Comments)    Reaction: altered mental status  . Amoxicillin Other (See Comments)    Reaction: blisters in mouth Has patient had a PCN reaction causing immediate rash, facial/tongue/throat swelling, SOB or lightheadedness with hypotension: Yes Has patient had a PCN reaction causing severe rash involving mucus membranes or skin necrosis: No Has patient had a PCN reaction that required hospitalization No Has patient had a PCN reaction occurring within the last 10 years: Yes If all of the above answers are "NO", then may proceed with Cephalosporin use.     Antimicrobials this admission: Cefepime  >>  Vancomycin >>   Dose adjustments this admission:   Microbiology results:  BCx:   UCx:    Sputum:    MRSA PCR: negative    Thank you for allowing pharmacy to be a part of this patient's care.  Emmary Culbreath D 01/20/2017 2:32 PM

## 2017-01-21 LAB — VANCOMYCIN, RANDOM: Vancomycin Rm: 22

## 2017-01-21 MED ORDER — POTASSIUM CHLORIDE 20 MEQ PO PACK
20.0000 meq | PACK | Freq: Every day | ORAL | Status: DC
  Administered 2017-01-22 (×2): 20 meq via ORAL
  Filled 2017-01-21: qty 1

## 2017-01-21 MED ORDER — FUROSEMIDE 20 MG PO TABS
40.0000 mg | ORAL_TABLET | Freq: Every day | ORAL | Status: DC
  Administered 2017-01-22 – 2017-02-02 (×8): 40 mg via ORAL
  Filled 2017-01-21 (×11): qty 1

## 2017-01-21 MED ORDER — METOPROLOL SUCCINATE ER 25 MG PO TB24: 25.0000 mg | ORAL_TABLET | Freq: Every day | ORAL | Status: DC

## 2017-01-21 MED ORDER — FUROSEMIDE 20 MG PO TABS: 20.0000 mg | ORAL_TABLET | Freq: Once | ORAL | Status: DC

## 2017-01-21 MED ORDER — METOPROLOL SUCCINATE ER 50 MG PO TB24
50.0000 mg | ORAL_TABLET | Freq: Every day | ORAL | Status: DC
  Administered 2017-01-21 – 2017-01-22 (×2): 50 mg via ORAL
  Filled 2017-01-21 (×2): qty 1

## 2017-01-21 MED ORDER — MIRTAZAPINE 15 MG PO TBDP
15.0000 mg | ORAL_TABLET | Freq: Every day | ORAL | Status: DC
  Administered 2017-01-21 – 2017-01-22 (×2): 15 mg via ORAL
  Filled 2017-01-21 (×3): qty 1

## 2017-01-21 MED ORDER — DEXTROSE 5 % IV SOLN
2.0000 g | Freq: Three times a day (TID) | INTRAVENOUS | Status: DC
  Administered 2017-01-21 – 2017-01-22 (×3): 2 g via INTRAVENOUS
  Filled 2017-01-21 (×2): qty 2
  Filled 2017-01-21: qty 1
  Filled 2017-01-21 (×3): qty 2

## 2017-01-21 MED ORDER — VANCOMYCIN HCL IN DEXTROSE 750-5 MG/150ML-% IV SOLN
750.0000 mg | Freq: Once | INTRAVENOUS | Status: AC
  Administered 2017-01-21: 750 mg via INTRAVENOUS
  Filled 2017-01-21: qty 150

## 2017-01-21 NOTE — Progress Notes (Signed)
[facility] - [place] at [facility]   [name]    MR#:  [id number]  DATE OF BIRTH:  January 20, 1946  SUBJECTIVE:  CHIEF COMPLAINT:  Patient is chronically bedbound , has Michiel Sites lift at home. Patient is depressed and not eating much  REVIEW OF SYSTEMS:   Review of Systems  Constitutional: Positive for malaise/fatigue. Negative for fever and weight loss.  HENT: Negative for nosebleeds and sore throat.   Eyes: Negative for blurred vision.  Respiratory: Positive for cough. Negative for shortness of breath and wheezing.   Cardiovascular: Negative for chest pain, orthopnea and leg swelling.  Gastrointestinal: Negative for abdominal pain, constipation, diarrhea, heartburn, nausea and vomiting.  Genitourinary: Negative for dysuria and urgency.  Musculoskeletal:Chronically bedbound Skin: Negative for rash.  Neurological: Positive for weakness. Negative for dizziness, speech change, focal weakness and headaches.  Endo/Heme/Allergies: Does not bruise/bleed easily.  Psychiatric/Behavioral: Depressed    DRUG ALLERGIES:   Allergies  Allergen Reactions  . Ambien [Zolpidem Tartrate] Other (See Comments)    Reaction: altered mental status  . Amoxicillin Other (See Comments)    Reaction: blisters in mouth Has patient had a PCN reaction causing immediate rash, facial/tongue/throat swelling, SOB or lightheadedness with hypotension: Yes Has patient had a PCN reaction causing severe rash involving mucus membranes or skin necrosis: No Has patient had a PCN reaction that required hospitalization No Has patient had a PCN reaction occurring within the last 10 years: Yes If all of the above answers are "NO", then may proceed with Cephalosporin use.     VITALS:  Blood pressure 129/63, pulse (!) 122, temperature 99.2 F (37.3 C), temperature source Oral, resp. rate 18, height  (1.778 m), weight 93 kg (205 lb), SpO2 97 %.  PHYSICAL EXAMINATION:   GENERAL:  71 y.o.-year-old patient lying in the bed with no acute distress.  EYES: Pupils equal, round, reactive to light and accommodation. No scleral icterus. Extraocular muscles intact.  HEENT: Head atraumatic, normocephalic. Oropharynx and nasopharynx clear.  NECK:  Supple, no jugular venous distention. No thyroid enlargement, no tenderness.  LUNGS: Diminished breath sounds on the left lower lobe, moderate breath sounds bilaterally, no wheezing, rales,rhonchi or crepitation. No use of accessory muscles of respiration.  CARDIOVASCULAR: S1, S2 normal. No murmurs, rubs, or gallops.  ABDOMEN: Soft, nontender, nondistended. Bowel sounds present.  EXTREMITIES: 1+ dependent pedal edema, no cyanosis, or clubbing. Chronically bedbound NEUROLOGIC: Chronically bedbound from lower extremity weakness ;  PSYCHIATRIC: The patient with flat affect SKIN: No rash ; sacral area with stage I skin breakdown with superficial erythema   LABORATORY PANEL:   CBC  Recent Labs Lab 01/20/17 0317  WBC 9.6  HGB 11.5*  HCT 34.9*  PLT 311   ------------------------------------------------------------------------------------------------------------------  Chemistries   Recent Labs Lab 01/17/17 0937  01/19/17 1531  NA  --   < > 135  K 3.4*  < > 3.8  CL  --   < > 100*  CO2  --   < > 26  GLUCOSE  --   < > 99  BUN  --   < > 16  CREATININE  --   < > 0.73  CALCIUM  --   < > 8.8*  MG 1.8  --   --   AST  --   --  18  18  ALT  --   --  10*  11*  ALKPHOS  --   --  123  119  BILITOT  --   --  0.8  0.8  < > = values in this interval not displayed. ------------------------------------------------------------------------------------------------------------------  Cardiac Enzymes  Recent Labs Lab 01/15/17 1604  TROPONINI <0.03   ------------------------------------------------------------------------------------------------------------------  RADIOLOGY:  Dg Chest 2 View  Result Date:  01/20/2017 CLINICAL DATA:  Shortness of breath, lethargy. EXAM: CHEST  2 VIEW COMPARISON:  Portable chest x-ray of January 18, 2017 FINDINGS: The right lung is adequately inflated and clear. On the left there is persistent volume loss involving the mid and lower lung. The left hemidiaphragm remains obscured. The cardiac silhouette remains enlarged. The pulmonary vascularity is not clearly engorged. IMPRESSION: Chronic bronchitic changes, stable. Persistent and perhaps increased density in the left mid and lower lung consistent with atelectasis or pneumonia with probable left pleural effusion. Stable cardiomegaly without pulmonary vascular congestion. Electronically Signed   By: David  [place] M.D.   On: 01/20/2017 09:49    EKG:   Orders placed or performed during the hospital encounter of 01/15/17  . ED EKG  . ED EKG  . EKG 12-Lead  . EKG 12-Lead    ASSESSMENT AND PLAN:    #. Altered mental status- secondary to metabolic encephalopathy from pneumonia -Patient is Almost back to her baseline  We will avoid all sedatives including IV Benadryl -Clinically  slowly improving, according to the son patient is at her baseline -CT head is negative -Neurology consulted, as patient's clinical condition is improving not considering MRI at this time -Continue treatment for underlying pneumonia with IV antibiotics Cefepime and vancomycin -Repeat chest x-ray With small left-sided pleural effusion, consolidation in the left lower lobe, not worsening  # . Pneumonia- source of patient's altered mental status.  Patient clinically denies any shortness of breath fever or any other source respiratory symptoms.  CT chest and chest x-ray shows a total collapse of her left lower lobe with a pleural effusion.   for ultrasound-guided thoracentesis 01/19/17 continue empirically cefepime and vancomycin. Follow cultures if we can collect sputum sample IS ID consult placed  # Abnormal urinalysis  urine culture  ordered. Patient is on broad-spectrum IV antibiotics cefepime and vancomycin Infectious disease is consulted as the patient developed UTI while on cefepime and vancomycin  #. Pleural effusion-patient noted to have a large pleural effusion on the left sideOn CT chest  ultrasound-guided diagnostic/therapeutic thoracentesis done 01/18/17  Clinically patient is afebrile and Hemodynamically stable and not showing any signs of sepsis/empyema.  #. Pericardial effusion-this was incidentally noted on the CT chest. According to Dr. Welton Flakes patient has small to moderate pericardial effusion requiring conservative management,patient is asymptomatic   two-dimensional echocardiogram With moderate to large pericardial effusion   5. Hypothyroidism-continue Synthroid.Check TSH  #. Depression- acute on chronic  Seen by psychiatrist .. Start to taper down the Effexor which is already at a fairly high dose and not working. Decreased dose 250 mg at night Starting patient on mirtazapine 7.5mg  daily at bedtime along with Zyprexa Appreciate chaplain services  #. Essential hypertension-continue Norvasc.  8. Hyperlipidemia-continue atorvastatin.  9. Neuropathy-continue Lyrica.  10. Poor by mouth intake Gentle hydration with IV fluids, dietitian is following. Frequent small feeds  encouraged     All the records are reviewed and case discussed with Care Management/Social Workerr. Management plans discussed with the patient, son at bedside and they are in agreement.  CODE STATUS: fc   TOTAL TIME TAKING CARE OF THIS PATIENT: 33  minutes.   POSSIBLE D/C IN 2-3 DAYS, DEPENDING ON CLINICAL CONDITION.  Note: This dictation was prepared with Dragon dictation  along with smaller phrase technology. Any transcriptional errors that result from this process are unintentional.   Ramonita Lab M.D on 01/21/2017 at 3:12 PM  Between 7am to 6pm - Pager - [phone] After 6pm go to [email or web address] - password [email or web address]  [facility]  [place]  Office  [phone]  CC: Primary care physician; Center, [facility]

## 2017-01-21 NOTE — Consult Note (Signed)
[facility] Face-to-Face Psychiatry Consult   Reason for Consult:  Consult for 71 year old woman for depression Referring Physician:  Gouru Patient Identification: Jessica [name] MRN:  [id number] Principal Diagnosis: Severe recurrent major depression with psychotic features [facility]) Diagnosis:   Patient Active Problem List   Diagnosis Date Noted  . Severe recurrent major depression with psychotic features (HCC) [F33.3] 01/20/2017  . Subacute delirium [F05] 01/20/2017  . Pneumonia [J18.9] 01/15/2017  . Sepsis (HCC) [A41.9] 01/01/2017  . Pressure injury of skin [L89.90] 12/19/2016  . UTI (urinary tract infection) [N39.0] 12/18/2016  . Tachycardia [R00.0] 09/12/2016  . Acute encephalopathy [G93.40] 08/14/2016  . Hallucinations [R44.3] 07/06/2016  . Hypokalemia [E87.6] 07/06/2016  . Sinus tachycardia [R00.0] 07/06/2016  . RECTAL PAIN [K62.89] 09/04/2009  . CONSTIPATION [K59.00] 07/08/2009  . 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 PALLOR [R23.1] 01/07/2009  . DIFFICULTY IN WALKING [R26.2] 11/22/2008  . ABDOMINAL OR PELVIC SWELLING MASS OR LUMP LLQ [R19.04] 10/01/2008  . LUMBAR RADICULOPATHY [IMO0002] 09/25/2008  . HLD (hyperlipidemia) [E78.5] 12/10/2006  . ANXIETY [F41.1] 12/10/2006  . DEPRESSION [F32.9] 12/10/2006  . Essential hypertension [I10] 12/10/2006  . OSTEOARTHRITIS [M19.90] 12/10/2006  . Hypothyroidism [E03.9] 09/22/2006  . HYPERCHOLESTEROLEMIA [E78.00] 09/22/2006  . ECZEMA [L25.9] 09/22/2006    Total Time spent with patient: 30 minutes  Subjective:   Jessica [name] is a 71 y.o. female patient admitted with "pneumonia".  Follow-up on this 71 year old woman with multiple medical problems. Today found her awake alert making eye contact and seeming to be more energetic. She tells me she is feeling better today. She can't really explain why that is that she feels more optimistic. Still denies any suicidal ideation. She denies any hallucinations although  her husband says that she did seem to be having some confusion earlier in the day. Reportedly she felt more comfortable sleeping at night.  HPI:  Patient interviewed. Chart reviewed. Patient's husband was bedside and also gave useful information. This is a 71 year old woman with multiple severe chronic and acute medical problems including history of paraplegia morbid obesity and now in the hospital with pneumonia. Concern about depression subjectively as well as objective lack of motivation to improve. Found the patient to be awake and interactive despite what initially appeared to be a withdrawn state. She did engage in the interview and gave appropriate answers although usually in very few words. Patient says she is depressed. She says she is depressed and has been so for a long time but feels like it's getting worse. She tells me that she is hopeless and feels like things will never get any better. Furthermore she says she is not even looking forward to getting out of the hospital and having things get better. She says that she is not suicidal and is not trying to give up but does not feel like eating. Reports her sleep is been bad. Energy level is poor. Patient's answers regarding hallucinations were a little hard to interpret. Husband tells me that she has had some visual hallucinations during this hospital stay but had not appeared to be having any recently. Patient told me that she had seen a dog in her bed but says she is not seeing it currently. She is not currently seeing an outpatient psychiatrist but is on psychiatric medicine for depression prescribed by her medical providers.  Social history: Patient lives with her husband. Adult son also is present and I believe the adult son is the power of attorney.  Medical history: Multiple medical problems including  spinal stenosis resulting in inability to walk, now with a pneumonia  Substance abuse history: Denies any current or past history of  substance abuse nothing in the chart about substance abuse  Past Psychiatric History: I note that the patient has not seen a psychiatrist on any of her visits to the hospital although multiple visits to the emergency room have been prompted by visual hallucinations. Patient appears to be prone to getting delirious very easily. Has had multiple visits for hallucinations and confusion that are attributed to delirium. Being admitted to a psychiatric hospital. Denies any past suicide attempts. Denies any history of mania. Currently on Effexor extended release 225 mg a day and Abilify 2 mg per day  Risk to Self: Is patient at risk for suicide?: No Risk to Others:   Prior Inpatient Therapy:   Prior Outpatient Therapy:    Past Medical History:  Past Medical History:  Diagnosis Date  . Chronic pain   . Depression   . HLD (hyperlipidemia)   . HTN (hypertension)   . Hypothyroidism   . Morbid obesity (HCC)   . Recurrent UTI   . Spinal stenosis   . Visual hallucinations     Past Surgical History:  Procedure Laterality Date  . ABDOMINAL HYSTERECTOMY    . BACK SURGERY    . HEMORROIDECTOMY     Family History:  Family History  Problem Relation Age of Onset  . Heart disease Father   . Deep vein thrombosis Father    Family Psychiatric  History: None is identified Social History:  History  Alcohol Use No     History  Drug Use No    Social History   Social History  . Marital status: Married    Spouse name: N/A  . Number of children: N/A  . Years of education: N/A   Social History Main Topics  . Smoking status: Never Smoker  . Smokeless tobacco: Former Neurosurgeon    Types: Snuff  . Alcohol use No  . Drug use: No  . Sexual activity: Not Asked   Other Topics Concern  . None   Social History Narrative  . None   Additional Social History:    Allergies:   Allergies  Allergen Reactions  . Ambien [Zolpidem Tartrate] Other (See Comments)    Reaction: altered mental status  .  Amoxicillin Other (See Comments)    Reaction: blisters in mouth Has patient had a PCN reaction causing immediate rash, facial/tongue/throat swelling, SOB or lightheadedness with hypotension: Yes Has patient had a PCN reaction causing severe rash involving mucus membranes or skin necrosis: No Has patient had a PCN reaction that required hospitalization No Has patient had a PCN reaction occurring within the last 10 years: Yes If all of the above answers are "NO", then may proceed with Cephalosporin use.     Labs:  Results for orders placed or performed during the hospital encounter of 01/15/17 (from the past 48 hour(s))  CBC     Status: Abnormal   Collection Time: 01/20/17  3:17 AM  Result Value Ref Range   WBC 9.6 3.6 - 11.0 K/uL   RBC 4.37 3.80 - 5.20 MIL/uL   Hemoglobin 11.5 (L) 12.0 - 16.0 g/dL   HCT 16.1 (L) 09.6 - 04.5 %   MCV 79.8 (L) 80.0 - 100.0 fL   MCH 26.3 26.0 - 34.0 pg   MCHC 33.0 32.0 - 36.0 g/dL   RDW 40.9 (H) 81.1 - 91.4 %   Platelets 311 150 -  440 K/uL  Urinalysis, Routine w reflex microscopic     Status: Abnormal   Collection Time: 01/20/17 10:47 PM  Result Value Ref Range   Color, Urine AMBER (A) YELLOW    Comment: BIOCHEMICALS MAY BE AFFECTED BY COLOR   APPearance CLOUDY (A) CLEAR   Specific Gravity, Urine 1.019 1.005 - 1.030   pH 5.0 5.0 - 8.0   Glucose, UA NEGATIVE NEGATIVE mg/dL   Hgb urine dipstick MODERATE (A) NEGATIVE   Bilirubin Urine NEGATIVE NEGATIVE   Ketones, ur 5 (A) NEGATIVE mg/dL   Protein, ur 30 (A) NEGATIVE mg/dL   Nitrite NEGATIVE NEGATIVE   Leukocytes, UA LARGE (A) NEGATIVE   RBC / HPF NONE SEEN 0 - 5 RBC/hpf   WBC, UA TOO NUMEROUS TO COUNT 0 - 5 WBC/hpf   Bacteria, UA NONE SEEN NONE SEEN   Squamous Epithelial / LPF NONE SEEN NONE SEEN  Vancomycin, random     Status: None   Collection Time: 01/21/17 10:17 AM  Result Value Ref Range   Vancomycin Rm 22     Comment:        Random Vancomycin therapeutic range is dependent on dosage  and time of specimen collection. A peak range is 20.0-40.0 ug/mL A trough range is 5.0-15.0 ug/mL            Current Facility-Administered Medications  Medication Dose Route Frequency Provider Last Rate Last Dose  . acetaminophen (TYLENOL) tablet 650 mg  650 mg Oral Q6H PRN Houston Siren, MD       Or  . acetaminophen (TYLENOL) suppository 650 mg  650 mg Rectal Q6H PRN Houston Siren, MD      . albuterol (PROVENTIL) (2.5 MG/3ML) 0.083% nebulizer solution 3 mL  3 mL Inhalation Q4H PRN Houston Siren, MD   3 mL at 01/21/17 0844  . amLODipine (NORVASC) tablet 2.5 mg  2.5 mg Oral Daily Houston Siren, MD   2.5 mg at 01/18/17 1036   And  . atorvastatin (LIPITOR) tablet 10 mg  10 mg Oral Daily Houston Siren, MD   10 mg at 01/21/17 0837  . aspirin EC tablet 81 mg  81 mg Oral Daily Houston Siren, MD   81 mg at 01/21/17 1610  . baclofen (LIORESAL) tablet 10 mg  10 mg Oral q morning - 10a Gouru, Aruna, MD   10 mg at 01/21/17 0836   And  . baclofen (LIORESAL) tablet 20 mg  20 mg Oral QHS Gouru, Aruna, MD   20 mg at 01/20/17 2245  . ceFEPIme (MAXIPIME) 2 g in dextrose 5 % 50 mL IVPB  2 g Intravenous Q8H Houston Siren, MD 100 mL/hr at 01/21/17 1832 2 g at 01/21/17 1832  . docusate sodium (COLACE) capsule 100 mg  100 mg Oral BID Houston Siren, MD   100 mg at 01/21/17 0837  . enoxaparin (LOVENOX) injection 40 mg  40 mg Subcutaneous Q24H Houston Siren, MD   40 mg at 01/20/17 2242  . fluconazole (DIFLUCAN) tablet 200 mg  200 mg Oral Daily Houston Siren, MD   200 mg at 01/21/17 0835  . fluticasone (FLONASE) 50 MCG/ACT nasal spray 2 spray  2 spray Each Nare Daily PRN Houston Siren, MD      . furosemide (LASIX) tablet 20 mg  20 mg Oral Once Caroleen Hamman, FNP      . [START ON 01/22/2017] furosemide (LASIX) tablet 40 mg  40 mg Oral Daily Cunningham,  Belenda Cruise, FNP      . levothyroxine (SYNTHROID, LEVOTHROID) tablet 150 mcg  150 mcg Oral QAC breakfast Houston Siren, MD   150  mcg at 01/21/17 1610  . liver oil-zinc oxide (DESITIN) 40 % ointment 1 application  1 application Topical PRN Sainani, Rolly Pancake, MD      . metoprolol succinate (TOPROL-XL) 24 hr tablet 50 mg  50 mg Oral Daily Caroleen Hamman, FNP   50 mg at 01/21/17 1508  . mirtazapine (REMERON SOL-TAB) disintegrating tablet 15 mg  15 mg Oral QHS Carlynn Leduc T, MD      . mupirocin ointment (BACTROBAN) 2 % 1 application  1 application Nasal BID Houston Siren, MD   1 application at 01/20/17 2200  . nystatin cream (MYCOSTATIN) 1 application  1 application Topical BID Houston Siren, MD   1 application at 01/20/17 2200  . OLANZapine zydis (ZYPREXA) disintegrating tablet 5 mg  5 mg Oral QHS Maleiyah Releford T, MD   5 mg at 01/20/17 2246  . ondansetron (ZOFRAN) tablet 4 mg  4 mg Oral Q6H PRN Houston Siren, MD       Or  . ondansetron (ZOFRAN) injection 4 mg  4 mg Intravenous Q6H PRN Sainani, Rolly Pancake, MD      . polyethylene glycol (MIRALAX / GLYCOLAX) packet 17 g  17 g Oral Daily Houston Siren, MD   17 g at 01/21/17 0837  . potassium chloride (KLOR-CON) packet 20 mEq  20 mEq Oral Daily Caroleen Hamman, FNP      . pregabalin (LYRICA) capsule 150 mg  150 mg Oral TID Houston Siren, MD   150 mg at 01/21/17 1507  . senna-docusate (Senokot-S) tablet 2 tablet  2 tablet Oral Daily Houston Siren, MD   2 tablet at 01/21/17 [phone]  . traZODone (DESYREL) tablet 50 mg  50 mg Oral QHS Houston Siren, MD   50 mg at 01/20/17 2245  . venlafaxine XR (EFFEXOR-XR) 24 hr capsule 150 mg  150 mg Oral Daily Jaydi Bray, Jackquline Denmark, MD   150 mg at 01/21/17 5409  . vitamin B-12 (CYANOCOBALAMIN) tablet 1,000 mcg  1,000 mcg Oral Daily Houston Siren, MD   1,000 mcg at 01/21/17 8119  . Vitamin D (Ergocalciferol) (DRISDOL) capsule 50,000 Units  50,000 Units Oral Q30 days Houston Siren, MD   50,000 Units at 01/16/17 0945    Musculoskeletal: Strength & Muscle Tone: decreased Gait & Station: unable to stand Patient leans:  N/A  Psychiatric Specialty Exam: Physical Exam  Nursing note and vitals reviewed. Constitutional: She appears well-developed and well-nourished. No distress.  HENT:  Head: Normocephalic and atraumatic.  Eyes: Pupils are equal, round, and reactive to light. Conjunctivae are normal.  Neck: Normal range of motion.  Cardiovascular: Normal heart sounds.   Respiratory: Breath sounds normal. No respiratory distress.  GI: Soft.  Musculoskeletal: Normal range of motion.  Neurological: She is alert.  Patient is paraplegic with minimal use of lower extremities  Skin: Skin is warm and dry.  Psychiatric: Her affect is blunt. Her speech is delayed. She is slowed. Cognition and memory are impaired. She does not exhibit a depressed mood. She expresses no suicidal ideation. She exhibits abnormal recent memory.    Review of Systems  Constitutional: Negative.   HENT: Negative.   Eyes: Negative.   Respiratory: Negative.   Cardiovascular: Negative.   Gastrointestinal: Negative.   Musculoskeletal: Negative.   Skin: Negative.   Neurological: Negative.  Psychiatric/Behavioral: Positive for hallucinations and memory loss. Negative for depression, substance abuse and suicidal ideas. The patient is nervous/anxious. The patient does not have insomnia.     Blood pressure 129/63, pulse (!) 122, temperature 99.2 F (37.3 C), temperature source Oral, resp. rate 18, height  (1.778 m), weight 93 kg (205 lb), SpO2 97 %.Body mass index is 29.41 kg/m.  General Appearance: Casual  Eye Contact:  None  Speech:  Slow  Volume:  Decreased  Mood:  Euthymic  Affect:  Constricted  Thought Process:  Goal Directed  Orientation:  Full (Time, Place, and Person)  Thought Content:  Thought content was very concrete. Answered questions in minimum number of words necessary. Did not appear to be paranoid. Unclear whether she was having hallucinations although she was not behaving as though she was currently.  Suicidal  Thoughts:  No  Homicidal Thoughts:  No  Memory:  Immediate;   Fair Recent;   Poor Remote;   Fair  Judgement:  Fair  Insight:  Fair  Psychomotor Activity:  Decreased  Concentration:  Concentration: Fair  Recall:  [employer] of Knowledge:  Fair  Language:  Fair  Akathisia:  No  Handed:  Right  AIMS (if indicated):     Assets:  Financial Resources/Insurance Housing Social Support  ADL's:  Impaired  Cognition:  Impaired,  Mild  Sleep:        Treatment Plan Summary: Daily contact with patient to assess and evaluate symptoms and progress in treatment, Medication management and Plan For whatever combination of reason she looked a little better today. Still denies suicidal ideation but seems to be more alert and optimistic. Certainly doesn't seem to of had any harm from changing her antidepressant. I will continue with the plan from yesterday only I am increasing the mirtazapine to 15 mg at night. Please contact psychiatrist on call over the weekend if needed.  Disposition: Patient does not meet criteria for psychiatric inpatient admission. Supportive therapy provided about ongoing stressors.  Mordecai Rasmussen, MD 01/21/2017 7:10 PM

## 2017-01-21 NOTE — Progress Notes (Signed)
Pharmacy Antibiotic Note  Jessica [name] is a 71 y.o. female admitted on 01/15/2017 with pneumonia.  Pharmacy has been consulted for Vancomycin and Cefepime dosing.  Plan: MD would like to restart Vancomycin therapy since patient clinical picture has not improved. Will redose Vancomycin @ 1 g IV x 1 dose due to previously elevated levels. Will recheck Vancomycin level ~24 hours post dose. If levels within goal range, consider restarting Vancomycin regimen  10/11: Vancomycin random level resulted @ 22. Will allow Vancomycin to continue clearing and will then redose @ 750 mg IV x 1 dose. Will recheck level in 24 hours @ 1500 on 10/12.   Continue Cefepime dosing 2 g IV q8 hours.   Height:  (177.8 cm) Weight: 205 lb (93 kg) IBW/kg (Calculated) : 68.5  Temp (24hrs), Avg:99 F (37.2 C), Min:98.8 F (37.1 C), Max:99.2 F (37.3 C)   Recent Labs Lab 01/15/17 1604 01/15/17 1605 01/16/17 0458 01/17/17 1438  01/18/17 0353 01/19/17 0453 01/19/17 1531 01/20/17 0317 01/21/17 1017  WBC 10.1  --  9.4  --   --  9.9  --  11.4* 9.6  --   CREATININE 0.72  --  0.71  --   --  0.59 0.59 0.73  --   --   LATICACIDVEN  --  1.5  --   --   --   --   --   --   --   --   VANCOTROUGH  --   --   --  35*  --   --   --   --   --   --   VANCORANDOM  --   --   --   --   < > 28 16  --   --  22  < > = values in this interval not displayed.  Estimated Creatinine Clearance: 82 mL/min (by C-G formula based on SCr of 0.73 mg/dL).    Allergies  Allergen Reactions  . Ambien [Zolpidem Tartrate] Other (See Comments)    Reaction: altered mental status  . Amoxicillin Other (See Comments)    Reaction: blisters in mouth Has patient had a PCN reaction causing immediate rash, facial/tongue/throat swelling, SOB or lightheadedness with hypotension: Yes Has patient had a PCN reaction causing severe rash involving mucus membranes or skin necrosis: No Has patient had a PCN reaction that required hospitalization  No Has patient had a PCN reaction occurring within the last 10 years: Yes If all of the above answers are "NO", then may proceed with Cephalosporin use.     Antimicrobials this admission: Cefepime  >>  Vancomycin >>   Dose adjustments this admission:   Microbiology results:  BCx:   UCx:    Sputum:    MRSA PCR: negative   Thank you for allowing pharmacy to be a part of this patient's care.  Jessica [name] D 01/21/2017 10:58 AM

## 2017-01-21 NOTE — Progress Notes (Signed)
Patient was able to take meds with sips of water, but refuses to eat. Family is worried about patient being negative towards them.

## 2017-01-21 NOTE — Progress Notes (Signed)
Nurse asked [place] to see pt that had refused eating and his attitude towards family was negative. [place] met pt and son at bedside. [place] spoke to pt, but when [facility] tried to engage son in [place], son refused to answer questions and walked out of pt's Rm. Pt seemed shocked because she could not explain why son acted the way he did. Pt appeared tired and down but said she was hopeful. Pt talked about her current health condition. [place] validated pt's feeling and provided pastoral care and presence. [place] will follow up with pt as needed.    01/21/17 1400  Clinical Encounter Type  Visited With Patient and family together  Visit Type Initial;Other (Comment)  Referral From Nurse  Consult/Referral To Chaplain  Spiritual Encounters  Spiritual Needs Other (Comment)

## 2017-01-21 NOTE — Progress Notes (Signed)
CCMD is down and patient unable to be monitored. Notified Dr. Allena Katz gave orders to hold tele until system back up. No need to move patient, but to replace tele when system is back up.

## 2017-01-21 NOTE — Progress Notes (Signed)
SUBJECTIVE: Pt is more alert today, more responsive to questions. Denies chest pain.   Vitals:   01/20/17 0745 01/20/17 0831 01/20/17 1735 01/20/17 2049  BP: (!) 134/55  (!) 126/98 129/63  Pulse: 100  89 (!) 122  Resp: 18  18   Temp: 98.9 F (37.2 C)  98.8 F (37.1 C) 99.2 F (37.3 C)  TempSrc: Axillary  Oral Oral  SpO2: 93% 95% 96% 95%  Weight:      Height:        Intake/Output Summary (Last 24 hours) at 01/21/17 0826 Last data filed at 01/21/17 0600  Gross per 24 hour  Intake             1180 ml  Output               50 ml  Net             1130 ml    LABS: Basic Metabolic Panel:  Recent Labs  16/10/96 0453 01/19/17 1531  NA 135 135  K 3.9 3.8  CL 99* 100*  CO2 28 26  GLUCOSE 99 99  BUN 16 16  CREATININE 0.59 0.73  CALCIUM 8.7* 8.8*   Liver Function Tests:  Recent Labs  01/19/17 1531  AST 18  18  ALT 10*  11*  ALKPHOS 123  119  BILITOT 0.8  0.8  PROT 6.3*  6.3*  ALBUMIN 2.1*  2.1*   No results for input(s): LIPASE, AMYLASE in the last 72 hours. CBC:  Recent Labs  01/19/17 1531 01/20/17 0317  WBC 11.4* 9.6  HGB 12.4 11.5*  HCT 37.7 34.9*  MCV 78.4* 79.8*  PLT 293 311   Cardiac Enzymes: No results for input(s): CKTOTAL, CKMB, CKMBINDEX, TROPONINI in the last 72 hours. BNP: Invalid input(s): POCBNP D-Dimer: No results for input(s): DDIMER in the last 72 hours. Hemoglobin A1C: No results for input(s): HGBA1C in the last 72 hours. Fasting Lipid Panel: No results for input(s): CHOL, HDL, LDLCALC, TRIG, CHOLHDL, LDLDIRECT in the last 72 hours. Thyroid Function Tests: No results for input(s): TSH, T4TOTAL, T3FREE, THYROIDAB in the last 72 hours.  Invalid input(s): FREET3 Anemia Panel: No results for input(s): VITAMINB12, FOLATE, FERRITIN, TIBC, IRON, RETICCTPCT in the last 72 hours.   PHYSICAL EXAM General: Well developed, well nourished, in no acute distress HEENT:  Normocephalic and atramatic Neck:  No JVD.  Lungs:  Congested Heart: HRRR . Normal S1 and S2 without gallops or murmurs.  Abdomen: Bowel sounds are positive, abdomen soft and non-tender  Msk:  Back normal, normal gait. Normal strength and tone for age. Extremities: 2+ pitting pedal edema   Neuro: Does not make eye contact. Psych:  Flat affect but responds to some questions  TELEMETRY: NSR 105bpm  ASSESSMENT AND PLAN: Pneumonia with presence of mild-moderate pericardial effusion not causing tamponade, complicated by UTI and major depression with psychotic features.   Marked new pedal edema and mild lung congestion. Will increase lasix to  daily and add potassium replacement.   Heart rate remains borderline high. Will change metoprolol to  ER daily. Will continue to monitor status.    Principal Problem:   Severe recurrent major depression with psychotic features [facility]) Active Problems:   Pneumonia   Subacute delirium    Caroleen Hamman, NP-C 01/21/2017 8:26 AM

## 2017-01-22 ENCOUNTER — Inpatient Hospital Stay: Payer: Medicare Other

## 2017-01-22 DIAGNOSIS — N3941 Urge incontinence: Secondary | ICD-10-CM

## 2017-01-22 DIAGNOSIS — N1330 Unspecified hydronephrosis: Secondary | ICD-10-CM

## 2017-01-22 DIAGNOSIS — N329 Bladder disorder, unspecified: Secondary | ICD-10-CM

## 2017-01-22 LAB — URINE CULTURE
Culture: 30000 — AB
Special Requests: NORMAL

## 2017-01-22 LAB — BODY FLUID CULTURE: Culture: NO GROWTH

## 2017-01-22 LAB — CREATININE, SERUM
Creatinine, Ser: 0.72 mg/dL (ref 0.44–1.00)
GFR calc Af Amer: >60 mL/min (ref >60)
GFR calc non Af Amer: >60 mL/min (ref >60)

## 2017-01-22 LAB — TSH: TSH: 0.371 u[IU]/mL (ref 0.350–4.500)

## 2017-01-22 MED ORDER — TRAZODONE HCL 50 MG PO TABS
25.0000 mg | ORAL_TABLET | Freq: Every day | ORAL | Status: DC
  Administered 2017-01-22 – 2017-01-23 (×2): 25 mg via ORAL
  Filled 2017-01-22 (×2): qty 1

## 2017-01-22 MED ORDER — FLUCONAZOLE 200 MG PO TABS
200.0000 mg | ORAL_TABLET | Freq: Every day | ORAL | Status: DC
  Administered 2017-01-23 – 2017-01-29 (×7): 200 mg via ORAL
  Filled 2017-01-22 (×7): qty 1

## 2017-01-22 NOTE — Consult Note (Signed)
Subjective: CC: Hydronephrosis.  Hx:  Jessica [name] is a 71 yo WF who I was asked to see by Dr. Amado Coe for bilateral hydronephrosis with stones and candiduria.   The patient was admitted to the hospital on 10/5 with altered mental status and was subsequently found to have a LLL pneumonia with a pleural effusion and pericardial infusion.   She had a UA with TNTC WBC's and culture was obtained that grew yeast.   She had a CT stone study today and was found to have moderate bilateral hydroureter to the bladder with a thickened bladder wall, a moderate PVR and small and layering stones in the right kidney and ureter that don't appear obstructing and the bladder.  There is also a 26mm LLP lesion with a density of 35HU that was marked and measured but not commented on in the report.  It could be a cyst or mass.  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   She has never had a urologic evaluation, but denies prior stones or GU surgery.    She has been in the ER several times since May of this year with MS changes and has been given the diagnosis of acute UTI.   Her cultures have uniformly grown yeast and she has been treated with diflucan on several occasions.   She has been afebrile with no hypotension or leukocytosis and her Cr has remained stable and normal.  Blood cultures are negative.   There are no prior dedicated upper tract studies for comparison but a pelvic CT in 2013 showed a full bladder with no wall thickening or distal ureteral dilation and a lumbar MRI in 2011 showed no hydro and no obvious lower pole mass. .    ROS:  Review of Systems  Constitutional: Negative for chills and fever.  Respiratory: Negative for cough and shortness of breath.   Gastrointestinal: Positive for constipation (chronic). Negative for abdominal pain.  Genitourinary: Positive for urgency (with chronic incontinence. ).  Neurological: Positive for sensory change (decreased  sensation in lower extremities. ) and focal weakness (in the lower extremities. ).  Psychiatric/Behavioral: Positive for hallucinations (these have resolved. ).  All other systems reviewed and are negative.   Allergies  Allergen Reactions  . Ambien [Zolpidem Tartrate] Other (See Comments)    Reaction: altered mental status  . Amoxicillin Other (See Comments)    Reaction: blisters in mouth Has patient had a PCN reaction causing immediate rash, facial/tongue/throat swelling, SOB or lightheadedness with hypotension: Yes Has patient had a PCN reaction causing severe rash involving mucus membranes or skin necrosis: No Has patient had a PCN reaction that required hospitalization No Has patient had a PCN reaction occurring within the last 10 years: Yes If all of the above answers are "NO", then may proceed with Cephalosporin use.     Past Medical History:  Diagnosis Date  . Chronic pain   . Depression   . HLD (hyperlipidemia)   . HTN (hypertension)   . Hypothyroidism   . Morbid obesity (HCC)   . Recurrent UTI   . Spinal stenosis   . Visual hallucinations     Past Surgical History:  Procedure Laterality Date  . ABDOMINAL HYSTERECTOMY    . BACK SURGERY    . HEMORROIDECTOMY      Social History   Social History  . Marital status: Married    Spouse name: N/A  . Number of children: N/A  . Years of education:  N/A   Occupational History  . Not on file.   Social History Main Topics  . Smoking status: Never Smoker  . Smokeless tobacco: Former Neurosurgeon    Types: Snuff  . Alcohol use No  . Drug use: No  . Sexual activity: Not on file   Other Topics Concern  . Not on file   Social History Narrative  . No narrative on file    Family History  Problem Relation Age of Onset  . Heart disease Father   . Deep vein thrombosis Father     Anti-infectives: Anti-infectives    Start     Dose/Rate Route Frequency Ordered Stop   01/23/17 1000  fluconazole (DIFLUCAN) tablet 200 mg      200 mg Oral Daily 01/22/17 1506     01/21/17 1600  ceFEPIme (MAXIPIME) 2 g in dextrose 5 % 50 mL IVPB  Status:  Discontinued     2 g 100 mL/hr over 30 Minutes Intravenous Every 8 hours 01/21/17 0853 01/22/17 1439   01/21/17 1500  vancomycin (VANCOCIN) IVPB 750 mg/150 ml premix     750 mg 150 mL/hr over 60 Minutes Intravenous  Once 01/21/17 1058 01/21/17 1808   01/20/17 1100  vancomycin (VANCOCIN) IVPB 1000 mg/200 mL premix     1,000 mg 200 mL/hr over 60 Minutes Intravenous  Once 01/20/17 1024 01/20/17 1429   01/19/17 1100  vancomycin (VANCOCIN) IVPB 1000 mg/200 mL premix     1,000 mg 200 mL/hr over 60 Minutes Intravenous STAT 01/19/17 1054 01/19/17 1231   01/19/17 0630  vancomycin (VANCOCIN) IVPB 1000 mg/200 mL premix  Status:  Discontinued     1,000 mg 200 mL/hr over 60 Minutes Intravenous Every 24 hours 01/19/17 0616 01/19/17 1054   01/16/17 0400  aztreonam (AZACTAM) 1 g in dextrose 5 % 50 mL IVPB  Status:  Discontinued     1 g 100 mL/hr over 30 Minutes Intravenous Every 8 hours 01/15/17 2023 01/15/17 2146   01/15/17 2200  ceFEPIme (MAXIPIME) 2 g in dextrose 5 % 50 mL IVPB  Status:  Discontinued     2 g 100 mL/hr over 30 Minutes Intravenous Every 8 hours 01/15/17 2146 01/21/17 0853   01/15/17 2130  fluconazole (DIFLUCAN) tablet 200 mg  Status:  Discontinued     200 mg Oral Daily 01/15/17 2127 01/22/17 1439   01/15/17 2015  aztreonam (AZACTAM) 2 g in dextrose 5 % 50 mL IVPB     2 g 100 mL/hr over 30 Minutes Intravenous  Once 01/15/17 2012 01/15/17 2139   01/15/17 2015  vancomycin (VANCOCIN) IVPB 1000 mg/200 mL premix  Status:  Discontinued     1,000 mg 200 mL/hr over 60 Minutes Intravenous  Once 01/15/17 2012 01/19/17 1054   01/15/17 0300  vancomycin (VANCOCIN) 1,250 mg in sodium chloride 0.9 % 250 mL IVPB  Status:  Discontinued     1,250 mg 166.7 mL/hr over 90 Minutes Intravenous Every 12 hours 01/15/17 2023 01/17/17 1611      Current Facility-Administered Medications   Medication Dose Route Frequency Provider Last Rate Last Dose  . acetaminophen (TYLENOL) tablet 650 mg  650 mg Oral Q6H PRN Houston Siren, MD       Or  . acetaminophen (TYLENOL) suppository 650 mg  650 mg Rectal Q6H PRN Sainani, Rolly Pancake, MD      . albuterol (PROVENTIL) (2.5 MG/3ML) 0.083% nebulizer solution 3 mL  3 mL Inhalation Q4H PRN Sainani, Rolly Pancake, MD   3 mL  at 01/22/17 1651  . amLODipine (NORVASC) tablet 2.5 mg  2.5 mg Oral Daily Houston Siren, MD   2.5 mg at 01/22/17 0844   And  . atorvastatin (LIPITOR) tablet 10 mg  10 mg Oral Daily Houston Siren, MD   10 mg at 01/22/17 0843  . aspirin EC tablet 81 mg  81 mg Oral Daily Houston Siren, MD   81 mg at 01/22/17 0846  . baclofen (LIORESAL) tablet 10 mg  10 mg Oral q morning - 10a Gouru, Aruna, MD   10 mg at 01/21/17 0836   And  . baclofen (LIORESAL) tablet 20 mg  20 mg Oral QHS Gouru, Aruna, MD   20 mg at 01/21/17 2200  . docusate sodium (COLACE) capsule 100 mg  100 mg Oral BID Houston Siren, MD   100 mg at 01/22/17 0847  . enoxaparin (LOVENOX) injection 40 mg  40 mg Subcutaneous Q24H Houston Siren, MD   40 mg at 01/21/17 2200  . [START ON 01/23/2017] fluconazole (DIFLUCAN) tablet 200 mg  200 mg Oral Daily Mick Sell, MD      . fluticasone (FLONASE) 50 MCG/ACT nasal spray 2 spray  2 spray Each Nare Daily PRN Houston Siren, MD      . furosemide (LASIX) tablet 40 mg  40 mg Oral Daily Caroleen Hamman, FNP   40 mg at 01/22/17 0843  . levothyroxine (SYNTHROID, LEVOTHROID) tablet 150 mcg  150 mcg Oral QAC breakfast Houston Siren, MD   150 mcg at 01/22/17 0843  . liver oil-zinc oxide (DESITIN) 40 % ointment 1 application  1 application Topical PRN Sainani, Rolly Pancake, MD      . mirtazapine (REMERON SOL-TAB) disintegrating tablet 15 mg  15 mg Oral QHS Clapacs, Dona Klemann T, MD   15 mg at 01/21/17 2200  . mupirocin ointment (BACTROBAN) 2 % 1 application  1 application Nasal BID Houston Siren, MD   1 application at  01/21/17 2200  . nystatin cream (MYCOSTATIN) 1 application  1 application Topical BID Houston Siren, MD   1 application at 01/22/17 1439  . OLANZapine zydis (ZYPREXA) disintegrating tablet 5 mg  5 mg Oral QHS Clapacs, Autrey Human T, MD   5 mg at 01/20/17 2246  . ondansetron (ZOFRAN) tablet 4 mg  4 mg Oral Q6H PRN Houston Siren, MD       Or  . ondansetron (ZOFRAN) injection 4 mg  4 mg Intravenous Q6H PRN Sainani, Rolly Pancake, MD      . polyethylene glycol (MIRALAX / GLYCOLAX) packet 17 g  17 g Oral Daily Houston Siren, MD   17 g at 01/22/17 0847  . potassium chloride (KLOR-CON) packet 20 mEq  20 mEq Oral Daily Caroleen Hamman, FNP   20 mEq at 01/22/17 0846  . pregabalin (LYRICA) capsule 150 mg  150 mg Oral TID Houston Siren, MD   150 mg at 01/22/17 1551  . senna-docusate (Senokot-S) tablet 2 tablet  2 tablet Oral Daily Houston Siren, MD   2 tablet at 01/21/17 [phone]  . traZODone (DESYREL) tablet 25 mg  25 mg Oral QHS Gouru, Aruna, MD      . venlafaxine XR (EFFEXOR-XR) 24 hr capsule 150 mg  150 mg Oral Daily Clapacs, Jackquline Denmark, MD   150 mg at 01/22/17 0848  . vitamin B-12 (CYANOCOBALAMIN) tablet 1,000 mcg  1,000 mcg Oral Daily Houston Siren, MD   1,000 mcg at 01/22/17 0844  .  Vitamin D (Ergocalciferol) (DRISDOL) capsule 50,000 Units  50,000 Units Oral Q30 days Houston Siren, MD   50,000 Units at 01/16/17 0945     Objective: Vital signs in last 24 hours: Temp:  [98.8 F (37.1 C)] 98.8 F (37.1 C) (10/12 0843) Pulse Rate:  [75-90] 75 (10/12 0843) Resp:  [16] 16 (10/12 0843) BP: (116-149)/(52-65) 149/65 (10/12 0843) SpO2:  [98 %-99 %] 98 % (10/12 0843)  Intake/Output from previous day: 10/11 0701 - 10/12 0700 In: 100 [IV Piggyback:100] Out: 650 [Urine:650] Intake/Output this shift: No intake/output data recorded.   Physical Exam  Constitutional: She is oriented to person, place, and time and well-developed, well-nourished, and in no distress. Vital signs are normal. She  appears unhealthy.  HENT:  Head: Normocephalic and atraumatic.  Neck: Normal range of motion. Neck supple. No thyromegaly present.  Cardiovascular: Normal rate, regular rhythm and normal heart sounds.   Pulmonary/Chest: Effort normal. No respiratory distress.  Abdominal: Soft. Bowel sounds are normal. She exhibits no distension and no mass. There is no tenderness.  Genitourinary:  Genitourinary Comments: Foley indwelling draining slightly cloudy urine.   Musculoskeletal: She exhibits no edema or tenderness.  Lymphadenopathy:    She has no cervical adenopathy.       Right: No inguinal and no supraclavicular adenopathy present.       Left: No inguinal and no supraclavicular adenopathy present.  Neurological: She is alert and oriented to person, place, and time.  Weak in legs with reduced sensation left > right but intact sensation in the genital region.   Skin: Skin is warm and dry.  Psychiatric: Mood and affect normal.  Vitals reviewed.   Lab Results:   Recent Labs  01/20/17 0317  WBC 9.6  HGB 11.5*  HCT 34.9*  PLT 311   BMET  Recent Labs  01/22/17 0447  CREATININE 0.72   PT/INR No results for input(s): LABPROT, INR in the last 72 hours. ABG No results for input(s): PHART, HCO3 in the last 72 hours.  Invalid input(s): PCO2, PO2  Studies/Results: Ct Renal Stone Study  Result Date: 01/22/2017 CLINICAL DATA:  Invasion. Recurrent urinary tract infections with candiduria. EXAM: CT ABDOMEN AND PELVIS WITHOUT CONTRAST TECHNIQUE: Multidetector CT imaging of the abdomen and pelvis was performed following the standard protocol without IV contrast. COMPARISON:  09/23/2009 pelvic CT.  01/15/2017 chest CT FINDINGS: Lower chest: Large pericardial effusion is not appreciably changed. Small dependent bilateral pleural effusions, increased on the right and stable on the left. Dependent bilateral lower lobe atelectasis. Hepatobiliary: Heterogeneous hepatic steatosis. No definite liver  surface irregularity. No discrete liver mass. Layering mildly dense material in the gallbladder, suggesting tiny gallstones, no gallbladder distention, gallbladder wall thickening or pericholecystic fluid. No biliary ductal dilatation. Pancreas: Normal, with no mass or duct dilation. Spleen: Normal size. No mass. Adrenals/Urinary Tract: Normal adrenals. Moderate bilateral hydroureteronephrosis to the level of the ureterovesical junctions bilaterally. Layering stones throughout the right renal collecting system measuring up to 6 mm in the interpolar right kidney and 4 mm in the right renal pelvis. No left renal stones. Layering stones in the right pelvic ureter, largest 4 mm. No left ureteral stones. Diffuse bladder wall thickening. Scattered punctate stones in the dependent bladder. Stomach/Bowel: Grossly normal stomach. Normal caliber small bowel with no small bowel wall thickening. Appendix not discretely visualized, with no pericecal inflammatory changes . Oral contrast transits to the rectum. Moderate colorectal stool volume with distention of the rectum by stool and gas left 8.1 cm  diameter. No large bowel wall thickening or significant pericolonic fat stranding. Vascular/Lymphatic: Atherosclerotic nonaneurysmal abdominal aorta. No pathologically enlarged lymph nodes in the abdomen or pelvis. Reproductive: Status post hysterectomy, with no abnormal findings at the vaginal cuff. No adnexal mass. Other: No pneumoperitoneum, ascites or focal fluid collection. Musculoskeletal: No aggressive appearing focal osseous lesions. Diffuse osteopenia. Mild thoracolumbar spondylosis. IMPRESSION: 1. Moderate bilateral hydroureteronephrosis to the level of the ureterovesical junctions bilaterally. Layering small stones throughout the right renal collecting system and pelvic segment of the right ureter. Stones do not appear to be obstructing. Mild diffuse bladder wall thickening, suggesting acute cystitis. Punctate layering  bladder stones. Findings may be due to chronic bladder voiding dysfunction versus chronic vesicoureteral reflux disease. 2. Large pericardial effusion, not appreciably changed since 01/15/2017 chest CT. 3. Small dependent bilateral pleural effusions, increased on the right and stable on the left . 4. Heterogeneous hepatic steatosis. 5. Cholelithiasis . 6.  Aortic Atherosclerosis (ICD10-I70.0). Electronically Signed   By: Delbert Phenix M.D.   On: 01/22/2017 16:08   I have reviewed the hospital notes, recent and historical cultures and UA's, labs, CT films and report and CXR films and report.    Assessment: 1.  Bilateral hydronephrosis with bladder wall thickening and urge incontinence possibly related to a neurologic cause from her spinal stenosis.   This is probably a chronic issue with low pressure since the Cr has remained normal.   I think the foley catheter is appropriate management at this time and she needs a renal US in a couple of days to reassess the hydro.  2.  Renal, bladder and ureteral stones.   These are small and non-obstructing and I don't think merit intervention at this time.  3.  Candiduria.   This is a chronic finding and may be difficult to eradicate with her anatomical changes.  It is difficult to know if this is even contributing to her symptoms at this time or in the past as the chronic candiduria is often asymptomatic.   I would recommend an ID consultation for further advice regarding therapy.  4.  Left lower pole renal mass.  This is indeterminate on a non-contrast CT and could at least initially be evaluated by the f/u renal US to determine if cystic or solid.    CC: Dr. Ramonita Lab     Bjorn Pippin J 01/22/2017 [id number]

## 2017-01-22 NOTE — Progress Notes (Addendum)
[facility] [place] [facility]   [name]    MR#:  [id number]  DATE OF BIRTH:  [birth year]  SUBJECTIVE:  CHIEF COMPLAINT:  Patient is chronically bedbound , has Michiel Sites lift at home. Patient is depressed and not eating much, son at bedside  REVIEW OF SYSTEMS:   Review of Systems  Constitutional: Positive for malaise/fatigue. Negative for fever and weight loss.  HENT: Negative for nosebleeds and sore throat.   Eyes: Negative for blurred vision.  Respiratory: Positive for cough. Negative for shortness of breath and wheezing.   Cardiovascular: Negative for chest pain, orthopnea and leg swelling.  Gastrointestinal: Negative for abdominal pain, constipation, diarrhea, heartburn, nausea and vomiting.  Genitourinary: Negative for dysuria and urgency.  Musculoskeletal:Chronically bedbound Skin: Negative for rash.  Neurological: Positive for weakness. Negative for dizziness, speech change, focal weakness and headaches.  Endo/Heme/Allergies: Does not bruise/bleed easily.  Psychiatric/Behavioral: Depressed    DRUG ALLERGIES:   Allergies  Allergen Reactions  . Ambien [Zolpidem Tartrate] Other (See Comments)    Reaction: altered mental status  . Amoxicillin Other (See Comments)    Reaction: blisters in mouth Has patient had a PCN reaction causing immediate rash, facial/tongue/throat swelling, SOB or lightheadedness with hypotension: Yes Has patient had a PCN reaction causing severe rash involving mucus membranes or skin necrosis: No Has patient had a PCN reaction that required hospitalization No Has patient had a PCN reaction occurring within the last 10 years: Yes If all of the above answers are "NO", then may proceed with Cephalosporin use.     VITALS:  Blood pressure (!) 149/65, pulse 75, temperature 98.8 F (37.1 C), temperature source Axillary, resp. rate 16, height  (1.778 m), weight 93 kg (205 lb), SpO2 98 %.  PHYSICAL  EXAMINATION:  GENERAL:  71 y.o.-year-old patient lying in the bed with no acute distress.  EYES: Pupils equal, round, reactive to light and accommodation. No scleral icterus. Extraocular muscles intact.  HEENT: Head atraumatic, normocephalic. Oropharynx and nasopharynx clear.  NECK:  Supple, no jugular venous distention. No thyroid enlargement, no tenderness.  LUNGS: Diminished breath sounds on the left lower lobe, moderate breath sounds bilaterally, no wheezing, rales,rhonchi or crepitation. No use of accessory muscles of respiration.  CARDIOVASCULAR: S1, S2 normal. No murmurs, rubs, or gallops.  ABDOMEN: Soft, nontender, nondistended. Bowel sounds present.  EXTREMITIES: 1+ dependent pedal edema, no cyanosis, or clubbing. Chronically bedbound NEUROLOGIC: Chronically bedbound from lower extremity weakness ;  PSYCHIATRIC: The patient with flat affect SKIN: No rash ; sacral area with stage I skin breakdown with superficial erythema   LABORATORY PANEL:   CBC  Recent Labs Lab 01/20/17 0317  WBC 9.6  HGB 11.5*  HCT 34.9*  PLT 311   ------------------------------------------------------------------------------------------------------------------  Chemistries   Recent Labs Lab 01/17/17 0937  01/19/17 1531 01/22/17 0447  NA  --   < > 135  --   K 3.4*  < > 3.8  --   CL  --   < > 100*  --   CO2  --   < > 26  --   GLUCOSE  --   < > 99  --   BUN  --   < > 16  --   CREATININE  --   < > 0.73 0.72  CALCIUM  --   < > 8.8*  --   MG 1.8  --   --   --   AST  --   --  18  18  --   ALT  --   --  10*  11*  --   ALKPHOS  --   --  123  119  --   BILITOT  --   --  0.8  0.8  --   < > = values in this interval not displayed. ------------------------------------------------------------------------------------------------------------------  Cardiac Enzymes No results for input(s): TROPONINI in the last 168  hours. ------------------------------------------------------------------------------------------------------------------  RADIOLOGY:  Ct Renal Stone Study  Result Date: 01/22/2017 CLINICAL DATA:  Invasion. Recurrent urinary tract infections with candiduria. EXAM: CT ABDOMEN AND PELVIS WITHOUT CONTRAST TECHNIQUE: Multidetector CT imaging of the abdomen and pelvis was performed following the standard protocol without IV contrast. COMPARISON:  09/23/2009 pelvic CT.  01/15/2017 chest CT FINDINGS: Lower chest: Large pericardial effusion is not appreciably changed. Small dependent bilateral pleural effusions, increased on the right and stable on the left. Dependent bilateral lower lobe atelectasis. Hepatobiliary: Heterogeneous hepatic steatosis. No definite liver surface irregularity. No discrete liver mass. Layering mildly dense material in the gallbladder, suggesting tiny gallstones, no gallbladder distention, gallbladder wall thickening or pericholecystic fluid. No biliary ductal dilatation. Pancreas: Normal, with no mass or duct dilation. Spleen: Normal size. No mass. Adrenals/Urinary Tract: Normal adrenals. Moderate bilateral hydroureteronephrosis to the level of the ureterovesical junctions bilaterally. Layering stones throughout the right renal collecting system measuring up to 6 mm in the interpolar right kidney and 4 mm in the right renal pelvis. No left renal stones. Layering stones in the right pelvic ureter, largest 4 mm. No left ureteral stones. Diffuse bladder wall thickening. Scattered punctate stones in the dependent bladder. Stomach/Bowel: Grossly normal stomach. Normal caliber small bowel with no small bowel wall thickening. Appendix not discretely visualized, with no pericecal inflammatory changes . Oral contrast transits to the rectum. Moderate colorectal stool volume with distention of the rectum by stool and gas left 8.1 cm diameter. No large bowel wall thickening or significant pericolonic  fat stranding. Vascular/Lymphatic: Atherosclerotic nonaneurysmal abdominal aorta. No pathologically enlarged lymph nodes in the abdomen or pelvis. Reproductive: Status post hysterectomy, with no abnormal findings at the vaginal cuff. No adnexal mass. Other: No pneumoperitoneum, ascites or focal fluid collection. Musculoskeletal: No aggressive appearing focal osseous lesions. Diffuse osteopenia. Mild thoracolumbar spondylosis. IMPRESSION: 1. Moderate bilateral hydroureteronephrosis to the level of the ureterovesical junctions bilaterally. Layering small stones throughout the right renal collecting system and pelvic segment of the right ureter. Stones do not appear to be obstructing. Mild diffuse bladder wall thickening, suggesting acute cystitis. Punctate layering bladder stones. Findings may be due to chronic bladder voiding dysfunction versus chronic vesicoureteral reflux disease. 2. Large pericardial effusion, not appreciably changed since 01/15/2017 chest CT. 3. Small dependent bilateral pleural effusions, increased on the right and stable on the left . 4. Heterogeneous hepatic steatosis. 5. Cholelithiasis . 6.  Aortic Atherosclerosis (ICD10-I70.0). Electronically Signed   By: Delbert Phenix M.D.   On: 01/22/2017 16:08    EKG:   Orders placed or performed during the hospital encounter of 01/15/17  . ED EKG  . ED EKG  . EKG 12-Lead  . EKG 12-Lead    ASSESSMENT AND PLAN:   #Bilateral hydronephrosis with abnormal urinalysis Urine culture with yeast-patient is on fluconazole CT renal study with bilateral hydronephrosis Urology- stat consult placed , paged twice to urology ,awaiting call back and Foley catheter placed Follow-up with infectious disease and urology   #. Altered mental status- secondary to metabolic encephalopathy from pneumonia -Patient is Almost back to her baseline  We will avoid all sedatives including IV Benadryl -Clinically  slowly improving, according to the son patient is at  her baseline -CT head is negative -Neurology consulted, as patient's clinical condition is improving not considering MRI at this time -Continue treatment for underlying pneumonia with IV antibiotics Cefepime and vancomycin -Repeat chest x-ray With small left-sided pleural effusion, consolidation in the left lower lobe, not worsening  # . Pneumonia- source of patient's altered mental status.  Patient clinically denies any shortness of breath fever or any other source respiratory symptoms.  CT chest and chest x-ray shows a total collapse of her left lower lobe with a pleural effusion.   for ultrasound-guided thoracentesis 01/19/17 Completed rx with cefepime and vancomycin.    #. Pleural effusion-patient noted to have a large pleural effusion on the left sideOn CT chest  ultrasound-guided diagnostic/therapeutic thoracentesis done 01/18/17  Clinically patient is afebrile and Hemodynamically stable and not showing any signs of sepsis/empyema.  #. Pericardial effusion-this was incidentally noted on the CT chest. According to Dr. Welton Flakes patient has small to moderate pericardial effusion requiring conservative management,patient is asymptomatic   two-dimensional echocardiogram With moderate to large pericardial effusion   5. Hypothyroidism-continue Synthroid.Check TSH  #. Depression- acute on chronic  Seen by psychiatrist .. Start to taper down the Effexor which is already at a fairly high dose and not working. Decreased dose 250 mg at night Starting patient on mirtazapine 7.5mg  daily at bedtime along with Zyprexa Appreciate chaplain services  #. Essential hypertension-continue Norvasc.  8. Hyperlipidemia-continue atorvastatin.  9. Neuropathy-continue Lyrica.  10. Poor by mouth intake Gentle hydration with IV fluids, dietitian is following. Frequent small feeds  encouraged     All the records are reviewed and case discussed with Care Management/Social Workerr. Management plans  discussed with the patient, son at bedside and they are in agreement.  CODE STATUS: fc   TOTAL TIME TAKING CARE OF THIS PATIENT: 33  minutes.   POSSIBLE D/C IN 2-3 DAYS, DEPENDING ON CLINICAL CONDITION.  Note: This dictation was prepared with Dragon dictation along with smaller phrase technology. Any transcriptional errors that result from this process are unintentional.   Ramonita Lab M.D on 01/22/2017 at 4:57 PM  Between 7am to 6pm - Pager - [phone] After 6pm go to [email or web address] - password [email or web address] [facility]  [facility] [facility]  Office  [phone]  CC: Primary care physician; [facility], [facility]

## 2017-01-22 NOTE — Progress Notes (Signed)
SUBJECTIVE: Pt remains unresponsive and does not make eye contact. She appears to be comfortable and RN reports she can take PO medication.   Vitals:   01/21/17 0830 01/21/17 0845 01/21/17 2113 01/22/17 0843  BP: 120/65  (!) 116/52 (!) 149/65  Pulse: (!) 110  90 75  Resp: 16   16  Temp: 97.9 F (36.6 C)   98.8 F (37.1 C)  TempSrc: Oral   Axillary  SpO2: 95% 97% 99% 98%  Weight:      Height:        Intake/Output Summary (Last 24 hours) at 01/22/17 1203 Last data filed at 01/22/17 0032  Gross per 24 hour  Intake              100 ml  Output              650 ml  Net             -550 ml    LABS: Basic Metabolic Panel:  Recent Labs  40/98/11 1531 01/22/17 0447  NA 135  --   K 3.8  --   CL 100*  --   CO2 26  --   GLUCOSE 99  --   BUN 16  --   CREATININE 0.73 0.72  CALCIUM 8.8*  --    Liver Function Tests:  Recent Labs  01/19/17 1531  AST 18  18  ALT 10*  11*  ALKPHOS 123  119  BILITOT 0.8  0.8  PROT 6.3*  6.3*  ALBUMIN 2.1*  2.1*   No results for input(s): LIPASE, AMYLASE in the last 72 hours. CBC:  Recent Labs  01/19/17 1531 01/20/17 0317  WBC 11.4* 9.6  HGB 12.4 11.5*  HCT 37.7 34.9*  MCV 78.4* 79.8*  PLT 293 311   Cardiac Enzymes: No results for input(s): CKTOTAL, CKMB, CKMBINDEX, TROPONINI in the last 72 hours. BNP: Invalid input(s): POCBNP D-Dimer: No results for input(s): DDIMER in the last 72 hours. Hemoglobin A1C: No results for input(s): HGBA1C in the last 72 hours. Fasting Lipid Panel: No results for input(s): CHOL, HDL, LDLCALC, TRIG, CHOLHDL, LDLDIRECT in the last 72 hours. Thyroid Function Tests:  Recent Labs  01/22/17 0447  TSH 0.371   Anemia Panel: No results for input(s): VITAMINB12, FOLATE, FERRITIN, TIBC, IRON, RETICCTPCT in the last 72 hours.   PHYSICAL EXAM General: Well developed, well nourished, in no acute distress HEENT:  Normocephalic and atramatic Neck:  No JVD.  Lungs: Clear bilaterally to  auscultation and percussion. Heart: HRRR . Normal S1 and S2 without gallops or murmurs.  Abdomen: Bowel sounds are positive, abdomen soft and non-tender  Msk:  Back normal, normal gait. Normal strength and tone for age. Extremities: No clubbing, cyanosis or edema.   Neuro: Alert and oriented X 3. Psych:  Good affect, responds appropriately  TELEMETRY: NSR 75bpm  ASSESSMENT AND PLAN: Pneumonia, with intermittent episodes of nonsustained SVT, currently heart rate at 75bpm. Will continue to monitor for signs of hemodynamic instability, continue metoprolol and lasix.CMP tomorrow.   Principal Problem:   Severe recurrent major depression with psychotic features [facility]) Active Problems:   Pneumonia   Subacute delirium    Caroleen Hamman, NP-C 01/22/2017 12:03 PM

## 2017-01-22 NOTE — Care Management (Signed)
RNCM spoke with patient's son regarding discharge planning. Patient may need home IV antibiotics at discharge; son is not familiar with that. He also states that he is not aware of patient ever receiving home health services in the past. He feels this will not be a problem and does not have a preference of agencies.  I have updated Barbara Cower with [facility] of this potential needs. Per son patient will need EMS to home.

## 2017-01-22 NOTE — Consult Note (Signed)
[facility] Infectious Disease     Reason for Consult:PNA, UTI   Referring Physician: Gouru, A Date of Admission:  01/15/2017   Principal Problem:   Severe recurrent major depression with psychotic features [facility]) Active Problems:   Pneumonia   Subacute delirium   HPI: Jessica [name] is a 71 y.o. female admitted with AMS. On admit she has wbc 10, no fevers, UA not done on admitb ut done 5 days after admit showed TNTC WBC.CXR with L opacity and effusion.   CT chest showed large pericardial effusion, mod L pleural effusion and LLL collapse.  Underwent thora 375 cc with LDH 173, TP 3.8 Ph 7.7, wbc 1200 33% PMN. Has been on 8 days of vanco and cefepime. MRSA PCR neg. Fluconazole since 10/5.  Pn;y + Cx is Candida.   At baseline she is chronically bedbound due to spinal stenosis, has hoyer lift at home.  Son states she has had recurrent UTIs for about 6 months now, often with yeast. She take fluc once a week for this. She has not had a urological evaluation. She is incontinent but family cares for her and she uses depends. Does not have hx of urinary issues and denies any issues with urine retention.  Past Medical History:  Diagnosis Date  . Chronic pain   . Depression   . HLD (hyperlipidemia)   . HTN (hypertension)   . Hypothyroidism   . Morbid obesity ([place])   . Recurrent UTI   . Spinal stenosis   . Visual hallucinations    Past Surgical History:  Procedure Laterality Date  . ABDOMINAL HYSTERECTOMY    . BACK SURGERY    . HEMORROIDECTOMY     Social History  Substance Use Topics  . Smoking status: Never Smoker  . Smokeless tobacco: Former Systems developer    Types: Snuff  . Alcohol use No   Family History  Problem Relation Age of Onset  . Heart disease Father   . Deep vein thrombosis Father     Allergies:  Allergies  Allergen Reactions  . Ambien [Zolpidem Tartrate] Other (See Comments)    Reaction: altered mental status  . Amoxicillin Other (See Comments)    Reaction: blisters  in mouth Has patient had a PCN reaction causing immediate rash, facial/tongue/throat swelling, SOB or lightheadedness with hypotension: Yes Has patient had a PCN reaction causing severe rash involving mucus membranes or skin necrosis: No Has patient had a PCN reaction that required hospitalization No Has patient had a PCN reaction occurring within the last 10 years: Yes If all of the above answers are "NO", then may proceed with Cephalosporin use.     Current antibiotics: Antibiotics Given (last 72 hours)    Date/Time Action Medication Dose Rate   01/19/17 2322 New Bag/Given   ceFEPIme (MAXIPIME) 2 g in dextrose 5 % 50 mL IVPB 2 g 100 mL/hr   01/20/17 0829 New Bag/Given   ceFEPIme (MAXIPIME) 2 g in dextrose 5 % 50 mL IVPB 2 g 100 mL/hr   01/20/17 1329 New Bag/Given   vancomycin (VANCOCIN) IVPB 1000 mg/200 mL premix 1,000 mg 200 mL/hr   01/20/17 1509 New Bag/Given   ceFEPIme (MAXIPIME) 2 g in dextrose 5 % 50 mL IVPB 2 g 100 mL/hr   01/21/17 0023 New Bag/Given   ceFEPIme (MAXIPIME) 2 g in dextrose 5 % 50 mL IVPB 2 g 100 mL/hr   01/21/17 0827 New Bag/Given   ceFEPIme (MAXIPIME) 2 g in dextrose 5 % 50 mL IVPB  2 g 100 mL/hr   01/21/17 1708 New Bag/Given   vancomycin (VANCOCIN) IVPB 750 mg/150 ml premix 750 mg 150 mL/hr   01/21/17 1832 New Bag/Given   ceFEPIme (MAXIPIME) 2 g in dextrose 5 % 50 mL IVPB 2 g 100 mL/hr   01/22/17 0032 New Bag/Given   ceFEPIme (MAXIPIME) 2 g in dextrose 5 % 50 mL IVPB 2 g 100 mL/hr   01/22/17 0837 New Bag/Given   ceFEPIme (MAXIPIME) 2 g in dextrose 5 % 50 mL IVPB 2 g 100 mL/hr      MEDICATIONS: . amLODipine  2.5 mg Oral Daily   And  . atorvastatin  10 mg Oral Daily  . aspirin EC  81 mg Oral Daily  . baclofen  10 mg Oral q morning - 10a   And  . baclofen  20 mg Oral QHS  . docusate sodium  100 mg Oral BID  . enoxaparin (LOVENOX) injection  40 mg Subcutaneous Q24H  . fluconazole  200 mg Oral Daily  . furosemide  40 mg Oral Daily  . levothyroxine   150 mcg Oral QAC breakfast  . metoprolol succinate  50 mg Oral Daily  . mirtazapine  15 mg Oral QHS  . mupirocin ointment  1 application Nasal BID  . nystatin cream  1 application Topical BID  . OLANZapine zydis  5 mg Oral QHS  . polyethylene glycol  17 g Oral Daily  . potassium chloride  20 mEq Oral Daily  . pregabalin  150 mg Oral TID  . senna-docusate  2 tablet Oral Daily  . traZODone  50 mg Oral QHS  . venlafaxine XR  150 mg Oral Daily  . vitamin B-12  1,000 mcg Oral Daily  . Vitamin D (Ergocalciferol)  50,000 Units Oral Q30 days    Review of Systems - unable to obtain   OBJECTIVE: Temp:  [98.8 F (37.1 C)] 98.8 F (37.1 C) (10/12 0843) Pulse Rate:  [75-90] 75 (10/12 0843) Resp:  [16] 16 (10/12 0843) BP: (116-149)/(52-65) 149/65 (10/12 0843) SpO2:  [98 %-99 %] 98 % (10/12 0843) Physical Exam  Constitutional:  Awake, lying in bed, very sleepy but able to answer yes no questions HENT: [place]/AT, PERRLA, no scleral icterus Mouth/Throat: Oropharynx is clear and dry. No oropharyngeal exudate.  Cardiovascular: Normal rate, regular rhythm distant Pulmonary/Chest: poor effort, bil rhonchi Neck = supple, no nuchal rigidity Abdominal: Soft. obese Bowel sounds are normal.  exhibits no distension. There is no tenderness.  Lymphadenopathy: no cervical adenopathy. No axillary adenopathy Neurological: sleepy appearing but able to interact  Skin: Skin is warm and dry. No rash noted. No erythema.  Psychiatric:flat affect LABS: Results for orders placed or performed during the hospital encounter of 01/15/17 (from the past 48 hour(s))  Urinalysis, Routine w reflex microscopic     Status: Abnormal   Collection Time: 01/20/17 10:47 PM  Result Value Ref Range   Color, Urine AMBER (A) YELLOW    Comment: BIOCHEMICALS MAY BE AFFECTED BY COLOR   APPearance CLOUDY (A) CLEAR   Specific Gravity, Urine 1.019 1.005 - 1.030   pH 5.0 5.0 - 8.0   Glucose, UA NEGATIVE NEGATIVE mg/dL   Hgb urine  dipstick MODERATE (A) NEGATIVE   Bilirubin Urine NEGATIVE NEGATIVE   Ketones, ur 5 (A) NEGATIVE mg/dL   Protein, ur 30 (A) NEGATIVE mg/dL   Nitrite NEGATIVE NEGATIVE   Leukocytes, UA LARGE (A) NEGATIVE   RBC / HPF NONE SEEN 0 - 5 RBC/hpf   WBC,   UA TOO NUMEROUS TO COUNT 0 - 5 WBC/hpf   Bacteria, UA NONE SEEN NONE SEEN   Squamous Epithelial / LPF NONE SEEN NONE SEEN  Urine Culture     Status: None (Preliminary result)   Collection Time: 01/20/17 10:52 PM  Result Value Ref Range   Specimen Description URINE, RANDOM    Special Requests Normal    Culture      CULTURE REINCUBATED FOR BETTER GROWTH Performed at [facility], [place] [address], [place], [place] [address]    Report Status PENDING   Vancomycin, random     Status: None   Collection Time: 01/21/17 10:17 AM  Result Value Ref Range   Vancomycin Rm 22     Comment:        Random Vancomycin therapeutic range is dependent on dosage and time of specimen collection. A peak range is 20.0-40.0 ug/mL A trough range is 5.0-15.0 ug/mL          Creatinine, serum     Status: None   Collection Time: 01/22/17  4:47 AM  Result Value Ref Range   Creatinine, Ser 0.72 0.44 - 1.00 mg/dL   GFR calc non Af Amer >60 >60 mL/min   GFR calc Af Amer >60 >60 mL/min    Comment: (NOTE) The eGFR has been calculated using the CKD EPI equation. This calculation has not been validated in all clinical situations. eGFR's persistently <60 mL/min signify possible Chronic Kidney Disease.   TSH     Status: None   Collection Time: 01/22/17  4:47 AM  Result Value Ref Range   TSH 0.371 0.350 - 4.500 uIU/mL    Comment: Performed by a 3rd Generation assay with a functional sensitivity of <=0.01 uIU/mL.   No components found for: ESR, C REACTIVE PROTEIN MICRO: Recent Results (from the past 720 hour(s))  Urine culture     Status: Abnormal   Collection Time: 01/01/17  3:27 PM  Result Value Ref Range Status   Specimen Description URINE, CATHETERIZED   Final   Special Requests NONE  Final   Culture 70,000 COLONIES/mL YEAST (A)  Final   Report Status 01/03/2017 FINAL  Final  Culture, blood (routine x 2)     Status: None   Collection Time: 01/01/17  3:32 PM  Result Value Ref Range Status   Specimen Description BLOOD Blood Culture adequate volume  Final   Special Requests BLOOD RIGHT HAND  Final   Culture NO GROWTH 5 DAYS  Final   Report Status 01/06/2017 FINAL  Final  Culture, blood (routine x 2)     Status: None   Collection Time: 01/01/17  3:32 PM  Result Value Ref Range Status   Specimen Description BLOOD Blood Culture adequate volume  Final   Special Requests BLOOD LEFT WRIST  Final   Culture NO GROWTH 5 DAYS  Final   Report Status 01/06/2017 FINAL  Final  MRSA PCR Screening     Status: None   Collection Time: 01/01/17  8:39 PM  Result Value Ref Range Status   MRSA by PCR NEGATIVE NEGATIVE Final    Comment:        The GeneXpert MRSA Assay (FDA approved for NASAL specimens only), is one component of a comprehensive MRSA colonization surveillance program. It is not intended to diagnose MRSA infection nor to guide or monitor treatment for MRSA infections.   Blood culture (routine x 2)     Status: None   Collection Time: 01/15/17  4:04 PM  Result Value Ref Range Status   Specimen Description BLOOD BLOOD LEFT ARM  Final   Special Requests   Final    BOTTLES DRAWN AEROBIC AND ANAEROBIC Blood Culture adequate volume   Culture NO GROWTH 5 DAYS  Final   Report Status 01/20/2017 FINAL  Final  Blood culture (routine x 2)     Status: None   Collection Time: 01/15/17  4:05 PM  Result Value Ref Range Status   Specimen Description BLOOD LEFT ANTECUBITAL  Final   Special Requests   Final    BOTTLES DRAWN AEROBIC AND ANAEROBIC Blood Culture adequate volume   Culture NO GROWTH 5 DAYS  Final   Report Status 01/20/2017 FINAL  Final  Urine Culture     Status: Abnormal   Collection Time: 01/15/17  8:51 PM  Result Value Ref Range  Status   Specimen Description URINE, CATHETERIZED  Final   Special Requests NONE  Final   Culture >=100,000 COLONIES/mL YEAST (A)  Final   Report Status 01/17/2017 FINAL  Final  Body fluid culture     Status: None   Collection Time: 01/18/17  9:10 AM  Result Value Ref Range Status   Specimen Description PLEURAL  Final   Special Requests NONE  Final   Gram Stain   Final    ABUNDANT WBC PRESENT,BOTH PMN AND MONONUCLEAR NO ORGANISMS SEEN    Culture   Final    NO GROWTH 3 DAYS Performed at [facility], [place] [address] [place], [place] [address]    Report Status 01/22/2017 FINAL  Final  MRSA PCR Screening     Status: None   Collection Time: 01/18/17  2:15 PM  Result Value Ref Range Status   MRSA by PCR NEGATIVE NEGATIVE Final    Comment:        The GeneXpert MRSA Assay (FDA approved for NASAL specimens only), is one component of a comprehensive MRSA colonization surveillance program. It is not intended to diagnose MRSA infection nor to guide or monitor treatment for MRSA infections.   Urine Culture     Status: None (Preliminary result)   Collection Time: 01/20/17 10:52 PM  Result Value Ref Range Status   Specimen Description URINE, RANDOM  Final   Special Requests Normal  Final   Culture   Final    CULTURE REINCUBATED FOR BETTER GROWTH Performed at [facility], [place] [address] [place], [place] [address]    Report Status PENDING  Incomplete    IMAGING: Dg Chest 1 View  Result Date: 01/18/2017 CLINICAL DATA:  Status post left thoracentesis for pleural effusion. EXAM: CHEST 1 VIEW COMPARISON:  01/15/2017 FINDINGS: Stable enlargement of cardiac silhouette. No pneumothorax after left thoracentesis. Residual left lower lobe consolidation present. No pulmonary edema. Mild atelectasis at the right lung base. IMPRESSION: No pneumothorax after left thoracentesis. Underlying left lower lobe pulmonary consolidation present. Electronically Signed   By: Aletta Edouard  M.D.   On: 01/18/2017 09:40   Dg Chest 2 View  Result Date: 01/20/2017 CLINICAL DATA:  Shortness of breath, lethargy. EXAM: CHEST  2 VIEW COMPARISON:  Portable chest x-ray of January 18, 2017 FINDINGS: The right lung is adequately inflated and clear. On the left there is persistent volume loss involving the mid and lower lung. The left hemidiaphragm remains obscured. The cardiac silhouette remains enlarged. The pulmonary vascularity is not clearly engorged. IMPRESSION: Chronic bronchitic changes, stable. Persistent and perhaps increased density in the left mid and lower lung consistent with  atelectasis or pneumonia with probable left pleural effusion. Stable cardiomegaly without pulmonary vascular congestion. Electronically Signed   By: David  Jordan M.D.   On: 01/20/2017 09:49   Dg Chest 2 View  Result Date: 01/18/2017 CLINICAL DATA:  Recent thoracentesis EXAM: CHEST  2 VIEW COMPARISON:  01/18/2017 FINDINGS: Cardiomegaly. Small left pleural effusion noted. No pneumothorax following thoracentesis. Bibasilar opacities, left greater than right could reflect atelectasis or infiltrates. IMPRESSION: Small left pleural effusion. No pneumothorax. Continued bibasilar atelectasis or infiltrates, left greater than right. Cardiomegaly. Electronically Signed   By: Kevin  Dover M.D.   On: 01/18/2017 15:03   Dg Chest 2 View  Result Date: 01/15/2017 CLINICAL DATA:  71-year-old female with a history of confusion EXAM: CHEST  2 VIEW COMPARISON:  01/01/2017 FINDINGS: Cardiomediastinal silhouette unchanged with cardiomegaly. Opacity at the left lung base, with opacity at the posterior base on the lateral view obscuring the costophrenic sulcus. No pneumothorax. No displaced fracture. IMPRESSION: Worsening left basilar opacity, with moderate sized pleural effusion. Given the changes on prior chest x-ray this may reflect a parapneumonic effusion with left lower lobe pneumonia. Electronically Signed   By: Jaime  Wagner D.O.    On: 01/15/2017 17:10   Dg Chest 2 View  Result Date: 01/01/2017 CLINICAL DATA:  Altered mental status. Recent UTI. History of hypertension and bronchitis. EXAM: CHEST  2 VIEW COMPARISON:  Chest x-rays dated 08/24/2016 and 08/14/2016. FINDINGS: Stable cardiomegaly. Lungs are hyperexpanded. Opacity at the left lung base. Probable small left pleural effusion. No acute or suspicious osseous finding. IMPRESSION: 1. Airspace opacity at the left lung base, suspicious for pneumonia, alternatively atelectasis or asymmetric edema. 2. Probable small left pleural effusion. 3. Stable cardiomegaly.  No evidence of CHF. 4. Hyperexpanded lungs suggesting COPD. Electronically Signed   By: Stan  Maynard M.D.   On: 01/01/2017 16:27   Ct Head Wo Contrast  Result Date: 01/19/2017 CLINICAL DATA:  Altered level of consciousness EXAM: CT HEAD WITHOUT CONTRAST TECHNIQUE: Contiguous axial images were obtained from the base of the skull through the vertex without intravenous contrast. COMPARISON:  Head CT 08/24/2016 FINDINGS: Brain: No mass lesion, intraparenchymal hemorrhage or extra-axial collection. No evidence of acute cortical infarct. There is periventricular hypoattenuation compatible with chronic microvascular disease. Vascular: Atherosclerotic calcification of the vertebral and internal carotid arteries at the skull base. Skull: Normal visualized skull base, calvarium and extracranial soft tissues. Sinuses/Orbits: No sinus fluid levels or advanced mucosal thickening. No mastoid effusion. Normal orbits. IMPRESSION: Chronic ischemic microangiopathy without acute intracranial abnormality. Electronically Signed   By: Kevin  Herman M.D.   On: 01/19/2017 01:04   Ct Chest W Contrast  Result Date: 01/15/2017 CLINICAL DATA:  Cough.  Altered mental status. EXAM: CT CHEST WITH CONTRAST TECHNIQUE: Multidetector CT imaging of the chest was performed during intravenous contrast administration. CONTRAST:  75mL ISOVUE-300 IOPAMIDOL  (ISOVUE-300) INJECTION 61% COMPARISON:  Two-view chest x-ray from the same day. FINDINGS: Cardiovascular: A large pericardial effusion is present. The heart size is normal. Coronary artery calcifications are noted. Atherosclerotic changes are noted at the aortic arch and great vessel origins there of the present descending aorta. There is no aneurysm or focal stenosis. Mediastinum/Nodes: No significant adenopathy is present. Gas is noted within the esophagus, likely within normal limits Lungs/Pleura: Left greater than right pleural effusions are present. There is volume loss at the lung bases bilaterally. No significant consolidative airspace disease is present. Minimal atelectasis is noted posteriorly in the left upper lobe as well. No significant nodule or mass   lesion is present. Upper Abdomen: Limited imaging the upper abdomen demonstrates atherosclerotic changes at the origins of the celiac and superior mesenteric artery is without significant stenosis. Solid organs are within normal limits. Musculoskeletal: No focal lytic or blastic lesions are present. Mild rightward curvature is present in the thoracic spine. No focal lytic or blastic lesions are present. IMPRESSION: 1. Large pericardial effusion. 2. Coronary artery calcifications. 3. Moderate left pleural effusion with near total collapse of the left lower lobe. 4. Small right pleural effusion with mild right basilar atelectasis. 5.  Aortic Atherosclerosis (ICD10-I70.0). Critical Value/emergent results were called by telephone at the time of interpretation on 01/15/2017 at 8:49 pm to Dr. DAVID SCHAEVITZ , who verbally acknowledged these results. Electronically Signed   By: Christopher  Mattern M.D.   On: 01/15/2017 20:51   Dg Chest Port 1 View  Result Date: 01/18/2017 CLINICAL DATA:  Shortness of breath and confusion beginning 40 minutes ago EXAM: PORTABLE CHEST 1 VIEW COMPARISON:  Portable exam 2212 hours compared to 01/18/2017 at 1447 hours FINDINGS:  Enlargement of cardiac silhouette. Mediastinal contours and pulmonary vascularity normal. Question atelectasis versus consolidation in retrocardiac LEFT lower lobe. Remaining lungs clear. Cannot exclude small LEFT pleural effusion. No pneumothorax. Bones demineralized. IMPRESSION: Enlargement of cardiac silhouette. Atelectasis versus consolidation in retrocardiac LEFT lower lobe, cannot exclude small LEFT pleural effusion. Electronically Signed   By: Mark  Boles M.D.   On: 01/18/2017 22:34   Us Thoracentesis Asp Pleural Space W/img Guide  Result Date: 01/18/2017 CLINICAL DATA:  Left pleural effusion. EXAM: ULTRASOUND GUIDED LEFT THORACENTESIS COMPARISON:  Chest x-ray and CT of the chest on 01/15/2017 PROCEDURE: An ultrasound guided thoracentesis was thoroughly discussed with the patient's son and questions answered. The benefits, risks, alternatives and complications were also discussed. The patient's son understands and wishes to proceed with the procedure. Written consent was obtained. Ultrasound was performed to localize and mark an adequate pocket of fluid in the left chest. The area was then prepped and draped in the normal sterile fashion. 1% Lidocaine was used for local anesthesia. Under ultrasound guidance a 6 French Safe-T-Centesis catheter was introduced. Thoracentesis was performed. The catheter was removed and postprocedural ultrasound was performed. A dressing applied. COMPLICATIONS: None FINDINGS: Overall size of the left pleural effusion is small to moderate by ultrasound. A total of approximately 375 mL of amber colored fluid was removed. A fluid sample was sent for laboratory analysis. Postprocedural ultrasound shows significant diminishment in pleural effusion with a small amount of pleural fluid remaining. IMPRESSION: Successful ultrasound guided left thoracentesis yielding 375 mL of pleural fluid. Electronically Signed   By: Glenn  Yamagata M.D.   On: 01/18/2017 09:44    Assessment:    Jessica [name] is a 71 y.o. female admitted with AMS and found to have pericardial and pleural effusion as well as PNA.  She also has had recurrent UTIs with yeast for about 6 months. Candiduria is unusual in patients without indwelling urinary caths however she has had multiple positive cultures with pyuria noted on UA.  I suspect she has a stone or possible fungal ball and will need imaging.  She likely also has resistant candida so will work it up in with micro lab.  Recommendations She has received 8 days of broad spectrum abx for PNA so can dc both vanco and cefepime.  We can continue fluconazole for now but will likely need to change to another agent if resistant. Check CT stone protocol. Discussed with patient and family.    Thank you very much for allowing me to participate in the care of this patient. Please call with questions.   David P. Fitzgerald, MD  

## 2017-01-23 LAB — COMPREHENSIVE METABOLIC PANEL
ALT: 10 U/L — ABNORMAL LOW (ref 14–54)
AST: 19 U/L (ref 15–41)
Albumin: 1.9 g/dL — ABNORMAL LOW (ref 3.5–5.0)
Alkaline Phosphatase: 101 U/L (ref 38–126)
Anion gap: 6 (ref 5–15)
BUN: 20 mg/dL (ref 6–20)
CO2: 28 mmol/L (ref 22–32)
Calcium: 8.5 mg/dL — ABNORMAL LOW (ref 8.9–10.3)
Chloride: 101 mmol/L (ref 101–111)
Creatinine, Ser: 0.9 mg/dL (ref 0.44–1.00)
GFR calc Af Amer: >60 mL/min (ref >60)
GFR calc non Af Amer: >60 mL/min (ref >60)
Glucose, Bld: 108 mg/dL — ABNORMAL HIGH (ref 65–99)
Potassium: 2.7 mmol/L — CL (ref 3.5–5.1)
Sodium: 135 mmol/L (ref 135–145)
Total Bilirubin: 0.7 mg/dL (ref 0.3–1.2)
Total Protein: 5.8 g/dL — ABNORMAL LOW (ref 6.5–8.1)

## 2017-01-23 LAB — POTASSIUM: Potassium: 4.8 mmol/L (ref 3.5–5.1)

## 2017-01-23 LAB — MAGNESIUM: Magnesium: 1.7 mg/dL (ref 1.7–2.4)

## 2017-01-23 LAB — AMMONIA: Ammonia: 33 umol/L (ref 9–35)

## 2017-01-23 MED ORDER — POTASSIUM CHLORIDE 10 MEQ/100ML IV SOLN
10.0000 meq | INTRAVENOUS | Status: AC
  Administered 2017-01-23 (×3): 10 meq via INTRAVENOUS
  Filled 2017-01-23 (×3): qty 100

## 2017-01-23 MED ORDER — MAGNESIUM SULFATE 2 GM/50ML IV SOLN
2.0000 g | Freq: Once | INTRAVENOUS | Status: AC
  Administered 2017-01-23: 2 g via INTRAVENOUS
  Filled 2017-01-23: qty 50

## 2017-01-23 MED ORDER — POTASSIUM CHLORIDE CRYS ER 20 MEQ PO TBCR
40.0000 meq | EXTENDED_RELEASE_TABLET | Freq: Once | ORAL | Status: AC
  Administered 2017-01-23: 40 meq via ORAL
  Filled 2017-01-23: qty 2

## 2017-01-23 NOTE — Progress Notes (Addendum)
[employer] - [place] at [facility]   [name]    MR#:  [id number]  DATE OF BIRTH:  12/17/1945  SUBJECTIVE:  Son is at bedside Eating well    REVIEW OF SYSTEMS:    Review of Systems  Patient opens eyes but more lethargic   Tolerating Diet: yes      DRUG ALLERGIES:   Allergies  Allergen Reactions  . Ambien [Zolpidem Tartrate] Other (See Comments)    Reaction: altered mental status  . Amoxicillin Other (See Comments)    Reaction: blisters in mouth Has patient had a PCN reaction causing immediate rash, facial/tongue/throat swelling, SOB or lightheadedness with hypotension: Yes Has patient had a PCN reaction causing severe rash involving mucus membranes or skin necrosis: No Has patient had a PCN reaction that required hospitalization No Has patient had a PCN reaction occurring within the last 10 years: Yes If all of the above answers are "NO", then may proceed with Cephalosporin use.     VITALS:  Blood pressure (!) 104/55, pulse 83, temperature 98.1 F (36.7 C), temperature source Oral, resp. rate 18, height  (1.778 m), weight 93 kg (205 lb), SpO2 100 %.  PHYSICAL EXAMINATION:  Constitutional: obese No distress. HENT: Normocephalic. Marland Kitchen Oropharynx is clear and moist.  Eyes: Conjunctivae and EOM are normal. PERRLA, no scleral icterus.  Neck: Normal ROM. Neck supple. No JVD. No tracheal deviation. CVS: RRR, S1/S2 +, no murmurs, no gallops, no carotid bruit.  Pulmonary: Effort and breath sounds normal, no stridor, rhonchi, wheezes, rales.  Abdominal: Soft. BS +,  no distension, tenderness, rebound or guarding.  Musculoskeletal:  ++1 +LEE no tenderness.  Neuro: Alert at times then closes eyes. . No focal deficits. Skin: Skin is warm and dry. No rash noted. Sacral stage 1 Psychiatric: flat depressed affect.      LABORATORY PANEL:   CBC  Recent Labs Lab 01/20/17 0317  WBC 9.6  HGB 11.5*  HCT 34.9*  PLT 311    ------------------------------------------------------------------------------------------------------------------  Chemistries   Recent Labs Lab 01/17/17 0937  01/23/17 0359  NA  --   < > 135  K 3.4*  < > 2.7*  CL  --   < > 101  CO2  --   < > 28  GLUCOSE  --   < > 108*  BUN  --   < > 20  CREATININE  --   < > 0.90  CALCIUM  --   < > 8.5*  MG 1.8  --   --   AST  --   < > 19  ALT  --   < > 10*  ALKPHOS  --   < > 101  BILITOT  --   < > 0.7  < > = values in this interval not displayed. ------------------------------------------------------------------------------------------------------------------  Cardiac Enzymes No results for input(s): TROPONINI in the last 168 hours. ------------------------------------------------------------------------------------------------------------------  RADIOLOGY:  Ct Renal Stone Study  Result Date: 01/22/2017 CLINICAL DATA:  Invasion. Recurrent urinary tract infections with candiduria. EXAM: CT ABDOMEN AND PELVIS WITHOUT CONTRAST TECHNIQUE: Multidetector CT imaging of the abdomen and pelvis was performed following the standard protocol without IV contrast. COMPARISON:  09/23/2009 pelvic CT.  01/15/2017 chest CT FINDINGS: Lower chest: Large pericardial effusion is not appreciably changed. Small dependent bilateral pleural effusions, increased on the right and stable on the left. Dependent bilateral lower lobe atelectasis. Hepatobiliary: Heterogeneous hepatic steatosis. No definite liver surface irregularity. No discrete liver mass. Layering mildly dense material in  the gallbladder, suggesting tiny gallstones, no gallbladder distention, gallbladder wall thickening or pericholecystic fluid. No biliary ductal dilatation. Pancreas: Normal, with no mass or duct dilation. Spleen: Normal size. No mass. Adrenals/Urinary Tract: Normal adrenals. Moderate bilateral hydroureteronephrosis to the level of the ureterovesical junctions bilaterally. Layering stones  throughout the right renal collecting system measuring up to 6 mm in the interpolar right kidney and 4 mm in the right renal pelvis. No left renal stones. Layering stones in the right pelvic ureter, largest 4 mm. No left ureteral stones. Diffuse bladder wall thickening. Scattered punctate stones in the dependent bladder. Stomach/Bowel: Grossly normal stomach. Normal caliber small bowel with no small bowel wall thickening. Appendix not discretely visualized, with no pericecal inflammatory changes . Oral contrast transits to the rectum. Moderate colorectal stool volume with distention of the rectum by stool and gas left 8.1 cm diameter. No large bowel wall thickening or significant pericolonic fat stranding. Vascular/Lymphatic: Atherosclerotic nonaneurysmal abdominal aorta. No pathologically enlarged lymph nodes in the abdomen or pelvis. Reproductive: Status post hysterectomy, with no abnormal findings at the vaginal cuff. No adnexal mass. Other: No pneumoperitoneum, ascites or focal fluid collection. Musculoskeletal: No aggressive appearing focal osseous lesions. Diffuse osteopenia. Mild thoracolumbar spondylosis. IMPRESSION: 1. Moderate bilateral hydroureteronephrosis to the level of the ureterovesical junctions bilaterally. Layering small stones throughout the right renal collecting system and pelvic segment of the right ureter. Stones do not appear to be obstructing. Mild diffuse bladder wall thickening, suggesting acute cystitis. Punctate layering bladder stones. Findings may be due to chronic bladder voiding dysfunction versus chronic vesicoureteral reflux disease. 2. Large pericardial effusion, not appreciably changed since 01/15/2017 chest CT. 3. Small dependent bilateral pleural effusions, increased on the right and stable on the left . 4. Heterogeneous hepatic steatosis. 5. Cholelithiasis . 6.  Aortic Atherosclerosis (ICD10-I70.0). Electronically Signed   By: Delbert Phenix M.D.   On: 01/22/2017 16:08      ASSESSMENT AND PLAN:   71 year old female with a history of depression and chronically bed bound due to spinal stenosis who presented October 5 due to altered mental status.  1.Acute metabolic encephalopathy in the setting of pneumonia and severe depression Patient seen and evaluated by neurology. CT head is negative Encephalopathy slow to improve, consider holding sedative medications if no improvement also awaiting final urine Cx results. 2. HCAP with pleural effusion status post thoracentesis Patient has completed treatment with vancomycin and cefepime ID consultation is appreciated.  3. Bilateral hydronephrosis: As per urology patient,this is probably a chronic issue. Continue Foley catheter at this time Repeat ultrasound on Sunday or Monday to reassess hydronephrosis.  4.recurrent urinary tract infections: Follow-up on final urine culture Continue fluconazole for now but may need to change to another agent  5. Severe depression: Patient is being followed by psychiatry Continue Remeron, Zyprexa and Effexor  6. Hypokalemia: Replete and recheck in a.m.  7. Pericardial effusion: Patient has been seen and evaluated by cardiology.Echocardiogram shows moderate  pericardial effusion. Patient has no signs of tamponade Patient will need outpatient follow up with Dr Welton Flakes to Reassess pericardial effusion. At present this is asymptomatic.  8. Essential hypertension: Continue Norvasc  9. Hyperlipidemia: Continue statin  10. Sacral wound: wound care consult placed 11. NSVT: heart rate controlled currently Cardiology following   Management plans discussed with the patient's son and hr is in agreement.  CODE STATUS: FULL  TOTAL TIME TAKING CARE OF THIS PATIENT: 24 minutes.     POSSIBLE D/C 2 days home with HHC , DEPENDING ON  CLINICAL CONDITION.   Onaje Warne M.D on 01/23/2017 at 8:44 AM  Between 7am to 6pm - Pager - [phone] After 6pm go to [email or web address] -  Social research officer, government  [facility]  Office  [phone]  CC: Primary care physician; Center, [facility]  Note: This dictation was prepared with Nurse, children's dictation along with smaller phrase technology. Any transcriptional errors that result from this process are unintentional.

## 2017-01-23 NOTE — Progress Notes (Signed)
SUBJECTIVE: Patient is more alert today denies chest pain or shortness of breath   Vitals:   01/21/17 2113 01/22/17 0843 01/22/17 2046 01/23/17 0917  BP: (!) 116/52 (!) 149/65 (!) 104/55 (!) 94/52  Pulse: 90 75 83   Resp:  16 18   Temp:  98.8 F (37.1 C) 98.1 F (36.7 C)   TempSrc:  Axillary Oral   SpO2: 99% 98% 100%   Weight:      Height:        Intake/Output Summary (Last 24 hours) at 01/23/17 0953 Last data filed at 01/23/17 0453  Gross per 24 hour  Intake                0 ml  Output              450 ml  Net             -450 ml    LABS: Basic Metabolic Panel:  Recent Labs  16/10/96 0447 01/23/17 0351 01/23/17 0359  NA  --   --  135  K  --   --  2.7*  CL  --   --  101  CO2  --   --  28  GLUCOSE  --   --  108*  BUN  --   --  20  CREATININE 0.72  --  0.90  CALCIUM  --   --  8.5*  MG  --  1.7  --    Liver Function Tests:  Recent Labs  01/23/17 0359  AST 19  ALT 10*  ALKPHOS 101  BILITOT 0.7  PROT 5.8*  ALBUMIN 1.9*   No results for input(s): LIPASE, AMYLASE in the last 72 hours. CBC: No results for input(s): WBC, NEUTROABS, HGB, HCT, MCV, PLT in the last 72 hours. Cardiac Enzymes: No results for input(s): CKTOTAL, CKMB, CKMBINDEX, TROPONINI in the last 72 hours. BNP: Invalid input(s): POCBNP D-Dimer: No results for input(s): DDIMER in the last 72 hours. Hemoglobin A1C: No results for input(s): HGBA1C in the last 72 hours. Fasting Lipid Panel: No results for input(s): CHOL, HDL, LDLCALC, TRIG, CHOLHDL, LDLDIRECT in the last 72 hours. Thyroid Function Tests:  Recent Labs  01/22/17 0447  TSH 0.371   Anemia Panel: No results for input(s): VITAMINB12, FOLATE, FERRITIN, TIBC, IRON, RETICCTPCT in the last 72 hours.   PHYSICAL EXAM General: Well developed, well nourished, in no acute distress HEENT:  Normocephalic and atramatic Neck:  No JVD.  Lungs: Clear bilaterally to auscultation and percussion. Heart: HRRR . Normal S1 and S2 without  gallops or murmurs.  Abdomen: Bowel sounds are positive, abdomen soft and non-tender  Msk:  Back normal, normal gait. Normal strength and tone for age. Extremities: No clubbing, cyanosis or edema.   Neuro: Alert and oriented X 3. Psych:  Good affect, responds appropriately  TELEMETRY:Sinus rhythm  ASSESSMENT AND PLAN: Status post thoracocentesis for left pleural effusion and small pericardial effusion with normal ejection fraction. Patient is being treated for depression and pneumonia.  Principal Problem:   Severe recurrent major depression with psychotic features (HCC) Active Problems:   Pneumonia   Subacute delirium    KHAN,SHAUKAT A, MD, [facility] 01/23/2017 9:53 AM

## 2017-01-23 NOTE — Progress Notes (Signed)
MEDICATION RELATED CONSULT NOTE - INITIAL   Pharmacy Consult for electrolyte replacement  Allergies  Allergen Reactions  . Ambien [Zolpidem Tartrate] Other (See Comments)    Reaction: altered mental status  . Amoxicillin Other (See Comments)    Reaction: blisters in mouth Has patient had a PCN reaction causing immediate rash, facial/tongue/throat swelling, SOB or lightheadedness with hypotension: Yes Has patient had a PCN reaction causing severe rash involving mucus membranes or skin necrosis: No Has patient had a PCN reaction that required hospitalization No Has patient had a PCN reaction occurring within the last 10 years: Yes If all of the above answers are "NO", then may proceed with Cephalosporin use.     Patient Measurements: Height:  (177.8 cm) Weight: 205 lb (93 kg) IBW/kg (Calculated) : 68.5  Labs:  CMP Latest Ref Rng & Units 01/23/2017 01/22/2017 01/19/2017  Glucose 65 - 99 mg/dL 102(V) - 99  BUN 6 - 20 mg/dL 20 - 16  Creatinine 2.53 - 1.00 mg/dL 6.64 4.03 4.74  Sodium 135 - 145 mmol/L 135 - 135  Potassium 3.5 - 5.1 mmol/L 2.7(LL) - 3.8  Chloride 101 - 111 mmol/L 101 - 100(L)  CO2 22 - 32 mmol/L 28 - 26  Calcium 8.9 - 10.3 mg/dL 2.5(Z) - 8.8(L)  Total Protein 6.5 - 8.1 g/dL 5.6(L) - 6.3(L)  Total Bilirubin 0.3 - 1.2 mg/dL 0.7 - 0.8  Alkaline Phos 38 - 126 U/L 101 - 123  AST 15 - 41 U/L 19 - 18  ALT 14 - 54 U/L 10(L) - 10(L)     Recent Labs  01/22/17 0447 01/23/17 0359  CREATININE 0.72 0.90  ALBUMIN  --  1.9*  PROT  --  5.8*  AST  --  19  ALT  --  10*  ALKPHOS  --  101  BILITOT  --  0.7   Estimated Creatinine Clearance: 72.9 mL/min (by C-G formula based on SCr of 0.9 mg/dL).  Assessment: K: 2.7  Plan:  Ordered KCl x 2, will give KCl IV x 1, check Mg and recheck K this evening.    Iyahna Obriant C 01/23/2017,8:16 AM

## 2017-01-23 NOTE — Progress Notes (Addendum)
Paged Dr. Juliene Pina due to patient being lethargic, Family in room, called doctor due tto new medication Remeron. Remeron discontinued per Mody.ordered stat pneumonia lab. Patient did eat breakfast and lunch today. Able to arouse when you call her name, but she goes back to sleep quickly.  Dr. Juliene Pina ordered ammonia level.

## 2017-01-24 ENCOUNTER — Inpatient Hospital Stay: Payer: Medicare Other

## 2017-01-24 DIAGNOSIS — N3941 Urge incontinence: Secondary | ICD-10-CM

## 2017-01-24 DIAGNOSIS — N329 Bladder disorder, unspecified: Secondary | ICD-10-CM

## 2017-01-24 DIAGNOSIS — R410 Disorientation, unspecified: Secondary | ICD-10-CM

## 2017-01-24 DIAGNOSIS — N1330 Unspecified hydronephrosis: Secondary | ICD-10-CM

## 2017-01-24 LAB — BASIC METABOLIC PANEL
Anion gap: 7 (ref 5–15)
BUN: 21 mg/dL — ABNORMAL HIGH (ref 6–20)
CO2: 28 mmol/L (ref 22–32)
Calcium: 8.9 mg/dL (ref 8.9–10.3)
Chloride: 101 mmol/L (ref 101–111)
Creatinine, Ser: 0.9 mg/dL (ref 0.44–1.00)
GFR calc Af Amer: >60 mL/min (ref >60)
GFR calc non Af Amer: >60 mL/min (ref >60)
Glucose, Bld: 123 mg/dL — ABNORMAL HIGH (ref 65–99)
Potassium: 4.5 mmol/L (ref 3.5–5.1)
Sodium: 136 mmol/L (ref 135–145)

## 2017-01-24 LAB — GLUCOSE, CAPILLARY: Glucose-Capillary: 98 mg/dL (ref 65–99)

## 2017-01-24 LAB — CK: Total CK: 21 U/L — ABNORMAL LOW (ref 38–234)

## 2017-01-24 MED ORDER — GADOBENATE DIMEGLUMINE 529 MG/ML IV SOLN: 19.0000 mL | Freq: Once | INTRAVENOUS | Status: DC | PRN

## 2017-01-24 NOTE — Progress Notes (Signed)
Dr. Chales Abrahams, Intensivist ordered labs, stat MRI, neuro consult and EEG after meeting with family.

## 2017-01-24 NOTE — Progress Notes (Signed)
Worked with pt extensively on flutter valve and coughing.  She has bilat rhonchi and is very lethargic.  She requires persistent coaching and encouragement and was able to expectorate thick yellow sputum. I reinforced this process with her 2 sons who are very concerned and motivated to assist.

## 2017-01-24 NOTE — Progress Notes (Signed)
Had [employer] call security and [employer] officer to 1A to be on standby on the unit if needed. Clydie Braun, CSW also aware of situation and gave emotional support to family.

## 2017-01-24 NOTE — Clinical Social Work Note (Signed)
CSW met with the family at bedside due to possible need for emotional support secondary to caregiver stressors. The CSW offered to speak privately with any family who would like to have the ability to receive emotional support. The patient's son accepted the offer. The CSW met with the patient's son in the conference room on 1C so that he could have privacy. The patient's son expressed his concern and dismay with the treatment of his mother during the course of this admission, and he indicated that he felt mislead and ignored as the HCPOA. The CSW listened actively as the patient's son attempted to process his understandable grief about the current admission. The CSW was called during this time to alert the patient's son that the rapid response for the patient was at a point where he was needed as the HCPOA to discuss goals of care. The CSW will continue to be available for the family as they need or request.  Santiago Bumpers, MSW, [facility] [phone]

## 2017-01-24 NOTE — Significant Event (Signed)
Rapid Response Event Note  Overview: called for rapid response due to delerium, decreased loc      Initial Focused Assessment: pt laying in bed, arouses to stimulation, vss, psychiatrist at bedside.   Interventions: psychiatrist spoke with hospitalst and intensivist who will come assess pt.  Plan of Care (if not transferred): call for further assistance  Event Summary:   at      at          [facility]

## 2017-01-24 NOTE — Progress Notes (Signed)
CH made a follow up visit. Husband is bedside. Pt appears awake but is not responding. CH spent some time listening to the husband's story concerning the Pt. Husband and son (not present) feel that her present condition may be due to medication adjustment. Both are very worried. CH provided the ministry of empathetic listening and prayer. CH is available for follow up as needed.     01/24/17 1700  Clinical Encounter Type  Visited With Patient;Patient and family together  Visit Type Follow-up  Consult/Referral To Chaplain  Spiritual Encounters  Spiritual Needs Emotional

## 2017-01-24 NOTE — Progress Notes (Addendum)
This morning I entered the room and Tomma Lightning the son was in the recliner and the patient was lying in bed. I spoke to the patient she was more alert than she was Saturday. The patient smiled and me and asked if I was back today. And she said she remembered me. I took her vital signs and told that I would return .If they needed anything to call me.    I Returned to room to give medication. The patient was sleeping but easy to arouse, she took her medication she was talking to me. The patient asked for water and wanted the large pill crushed. I gave her the meds.  I left the room to get applesauce and cup and finished given her the last of her medication. The patient was awake and eating her second package of applesauce. The son called me and wanted to see me.I entered room and he was upset about this mom and Tomma Lightning son said that no one here was taking care of her and he wanted to know what had happened to her. I looked through her chart and we talked about her meds and the test she done and the medication she had been given.  I then called Elnita Maxwell the supervisor when he asked to speak to someone higher than me.   I notified my manage Arline Asp and made her aware of what had happened.  Son was getting very upset and wanted to speak with supervisor and psychiatrist on call. Paged psychiatrist he will come to see patient. Son is very upset that his mom is not getting better. She is more alert today than yesterday. Mudlogger in to speak with with son. .Notifying Dr. Juliene Pina to see if icu physician can come and  Evaluate her. Paged Dr. Juliene Pina she ordered abg and she was going to call icu doctor.  Went over medication and what she had been taking over the week with son.  Son stated he was wanting answers and wanting someone to check on his mom. He was still concerned about the medication that had been ordered Friday Remeron and Zyprexa. Making frequent rounds on the patient and the son. .The psychiatrist Dr. Kathy Breach is  speaking to family now.  Son Tomma Lightning came out yelling. " YOU BETTER COME IN AND GET HIM OUT OF THIS ROOM NOW. Son went back into the room.  Tomma Lightning the son said tha the doctor came in smiling. He was upset  That the  Doctor was smiling . I notified  Elnita Maxwell the supervisor. She met me in the hallway, Elnita Maxwell and I went in together. Dr. Kathy Breach was speaking with the father and son. The son was itate and did not want to hear what the doctor was telling him. I retrieve the computer and let the doctor see her abg labs along with the supervisor. The husband  was understanding what the  Supervisor wanted a rapid repose called. Called a rapid.  rapidTeam showed up, notified  Dr. Chales Abrahams.  The social worker came into the room and took the [name] out to calm him down and they are talking now. I spoke to the father and he was telling me that his son, had a problem and that he takes medication and that he is hard deal with and he will come unglued here. The father, the husband of the patient was very concerned about his son and his mental state. He also came t the nurses station and repeated the same thing to three  other nurse.      The ICU doctor Dr. Lyndel Safe has arrived and has asked to speak to [facility].  Called Cheryl   to patietn room. Dr. Lyndel Safe ordered CK, MRI w/o contrast,EEG and neurologist consult.  Dr. Lyndel Safe said to hold Effexor and Lyrica. Dr. Lyndel Safe spoke with son and husband.   After Dr. Lyndel Safe left the son  Was in hallway yelling FIVE more days, just wait everyone telling  Me to wait five more days.  Neuro consult ordered.  the Neuro doctor called back and stated he was not going to any more consults tonight. The neuro doctor called Dr. Lyndel Safe.  Mri called down to have sent down to MRI without contrast.   I notified the son Tharon Aquas and explained to him that I was taking her down to  MRI. He asked me to stay with her while she was there. I stayed with her.  Pt was alert and asking about her son and we kept  her updated and I called to the floor to keep son and husband updated on our return to unit.   Pt left the floor in stable position, no complaints of pain she tolerated procedure well and able to stay awake during procedure and ask question.   We returned to room  With the patient, Estill Bamberg and I.    Husband requested that Dr. Trilby Drummer not to go into the room. because his son will become unglued.

## 2017-01-24 NOTE — Progress Notes (Signed)
[employer] - [place] [facility]   [name]    MR#:  [id number]  DATE OF BIRTH:  1945-10-02  SUBJECTIVE:  Patient is more alert this morning  REVIEW OF SYSTEMS:    Review of Systems  Constitutional: Negative for fever, chills weight loss ++malaise HENT: Negative for ear pain, nosebleeds, congestion, facial swelling, rhinorrhea, neck pain, neck stiffness and ear discharge.   Respiratory: Negative for cough, shortness of breath, wheezing  Cardiovascular: Negative for chest pain, palpitations and leg swelling.  Gastrointestinal: Negative for heartburn, abdominal pain, vomiting, diarrhea or consitpation Genitourinary: Negative for dysuria, urgency, frequency, hematuria Musculoskeletal: Negative for back pain or joint pain Neurological: Negative for dizziness, seizures, syncope, focal weakness,  numbness and headaches.  bedbound Hematological: Does not bruise/bleed easily.  Psychiatric/Behavioral: ++depression    Tolerating Diet: yes      DRUG ALLERGIES:   Allergies  Allergen Reactions  . Ambien [Zolpidem Tartrate] Other (See Comments)    Reaction: altered mental status  . Amoxicillin Other (See Comments)    Reaction: blisters in mouth Has patient had a PCN reaction causing immediate rash, facial/tongue/throat swelling, SOB or lightheadedness with hypotension: Yes Has patient had a PCN reaction causing severe rash involving mucus membranes or skin necrosis: No Has patient had a PCN reaction that required hospitalization No Has patient had a PCN reaction occurring within the last 10 years: Yes If all of the above answers are "NO", then may proceed with Cephalosporin use.     VITALS:  Blood pressure (!) 111/51, pulse 87, temperature 97.8 F (36.6 C), temperature source Oral, resp. rate 20, height  (1.778 m), weight 93 kg (205 lb), SpO2 100 %.  PHYSICAL EXAMINATION:  Constitutional: Appearsobese No distress. HENT:  Normocephalic. Marland Kitchen Oropharynx is clear and moist.  Eyes: Conjunctivae and EOM are normal. PERRLA, no scleral icterus.  Neck: Normal ROM. Neck supple. No JVD. No tracheal deviation. CVS: RRR, S1/S2 +, no murmurs, no gallops, no carotid bruit.  Pulmonary: Effort and breath sounds normal, no stridor, rhonchi, wheezes, rales.  Abdominal: Soft. BS +,  no distension, tenderness, rebound or guarding.  Musculoskeletal: Normal range of motion. ++2 +LEE no tenderness.  Neuro: Alert. CN 2-12 grossly intact. No focal deficits. Skin: Skin is warm and dry. No rash noted. Sacral stage 1 Psychiatric: flat depressed affect.      LABORATORY PANEL:   CBC  Recent Labs Lab 01/20/17 0317  WBC 9.6  HGB 11.5*  HCT 34.9*  PLT 311   ------------------------------------------------------------------------------------------------------------------  Chemistries   Recent Labs Lab 01/23/17 0351 01/23/17 0359  01/24/17 0341  NA  --  135  --  136  K  --  2.7*  < > 4.5  CL  --  101  --  101  CO2  --  28  --  28  GLUCOSE  --  108*  --  123*  BUN  --  20  --  21*  CREATININE  --  0.90  --  0.90  CALCIUM  --  8.5*  --  8.9  MG 1.7  --   --   --   AST  --  19  --   --   ALT  --  10*  --   --   ALKPHOS  --  101  --   --   BILITOT  --  0.7  --   --   < > = values in this interval not displayed. ------------------------------------------------------------------------------------------------------------------  Cardiac  Enzymes No results for input(s): TROPONINI in the last 168 hours. ------------------------------------------------------------------------------------------------------------------  RADIOLOGY:  Ct Renal Stone Study  Result Date: 01/22/2017 CLINICAL DATA:  Invasion. Recurrent urinary tract infections with candiduria. EXAM: CT ABDOMEN AND PELVIS WITHOUT CONTRAST TECHNIQUE: Multidetector CT imaging of the abdomen and pelvis was performed following the standard protocol without IV contrast.  COMPARISON:  09/23/2009 pelvic CT.  01/15/2017 chest CT FINDINGS: Lower chest: Large pericardial effusion is not appreciably changed. Small dependent bilateral pleural effusions, increased on the right and stable on the left. Dependent bilateral lower lobe atelectasis. Hepatobiliary: Heterogeneous hepatic steatosis. No definite liver surface irregularity. No discrete liver mass. Layering mildly dense material in the gallbladder, suggesting tiny gallstones, no gallbladder distention, gallbladder wall thickening or pericholecystic fluid. No biliary ductal dilatation. Pancreas: Normal, with no mass or duct dilation. Spleen: Normal size. No mass. Adrenals/Urinary Tract: Normal adrenals. Moderate bilateral hydroureteronephrosis to the level of the ureterovesical junctions bilaterally. Layering stones throughout the right renal collecting system measuring up to 6 mm in the interpolar right kidney and 4 mm in the right renal pelvis. No left renal stones. Layering stones in the right pelvic ureter, largest 4 mm. No left ureteral stones. Diffuse bladder wall thickening. Scattered punctate stones in the dependent bladder. Stomach/Bowel: Grossly normal stomach. Normal caliber small bowel with no small bowel wall thickening. Appendix not discretely visualized, with no pericecal inflammatory changes . Oral contrast transits to the rectum. Moderate colorectal stool volume with distention of the rectum by stool and gas left 8.1 cm diameter. No large bowel wall thickening or significant pericolonic fat stranding. Vascular/Lymphatic: Atherosclerotic nonaneurysmal abdominal aorta. No pathologically enlarged lymph nodes in the abdomen or pelvis. Reproductive: Status post hysterectomy, with no abnormal findings at the vaginal cuff. No adnexal mass. Other: No pneumoperitoneum, ascites or focal fluid collection. Musculoskeletal: No aggressive appearing focal osseous lesions. Diffuse osteopenia. Mild thoracolumbar spondylosis. IMPRESSION:  1. Moderate bilateral hydroureteronephrosis to the level of the ureterovesical junctions bilaterally. Layering small stones throughout the right renal collecting system and pelvic segment of the right ureter. Stones do not appear to be obstructing. Mild diffuse bladder wall thickening, suggesting acute cystitis. Punctate layering bladder stones. Findings may be due to chronic bladder voiding dysfunction versus chronic vesicoureteral reflux disease. 2. Large pericardial effusion, not appreciably changed since 01/15/2017 chest CT. 3. Small dependent bilateral pleural effusions, increased on the right and stable on the left . 4. Heterogeneous hepatic steatosis. 5. Cholelithiasis . 6.  Aortic Atherosclerosis (ICD10-I70.0). Electronically Signed   By: Delbert Phenix M.D.   On: 01/22/2017 16:08     ASSESSMENT AND PLAN:   71 year old female with a history of depression and chronically bed bound due to spinal stenosis who presented October 5 due to altered mental status.  1.Acute metabolic encephalopathy in the setting of pneumonia and severe depression Patient seen and evaluated by neurology. CT head is negative Encephalopathy slowly improving Some sedative medications on hold for now.  2. HCAP with pleural effusion status post thoracentesis Patient has completed treatment with vancomycin and cefepime ID consultation is appreciated.  3. Bilateral hydronephrosis: As per urology patient,this is probably a chronic issue. Continue Foley catheter at this time Repeat ultrasound Tomorrow to reassess hydronephrosis.  4.recurrent urinary tract infections: Urine culture positive for Candida sent to lab core for sensitivities. Continue fluconazole for now but may need to change to another agent  5. Severe depression: Patient is being followed by psychiatry Due to severe lethargy trazodone and remeron stopped for now  6. Hypokalemia: Repleted   7. Pericardial effusion: Patient has been seen and evaluated  by cardiology.Echocardiogram shows moderate  pericardial effusion. Patient has no signs of tamponade Patient will need outpatient follow up with Dr Welton Flakes to Reassess pericardial effusion. At present this is asymptomatic.  8. Essential hypertension: Continue Norvasc  9. Hyperlipidemia: Continue statin  10. Sacral wound: wound care consult placed 11. NSVT: heart rate controlled currently Cardiology following  OT/PT  Management plans discussed with the patient's son and he is in agreement. D/w dr Annabell Howells   CODE STATUS: FULL  TOTAL TIME TAKING CARE OF THIS PATIENT: 27 minutes.     POSSIBLE D/C 2 days home with [facility] , DEPENDING ON CLINICAL CONDITION.   Jniyah Dantuono M.D on 01/24/2017 at 9:48 AM  Between 7am to 6pm - Pager - [phone] After 6pm go to [email or web address] - Social research officer, government  [facility]  Office  [phone]  CC: Primary care physician; Center, [facility]  Note: This dictation was prepared with Nurse, children's dictation along with smaller phrase technology. Any transcriptional errors that result from this process are unintentional.

## 2017-01-24 NOTE — Progress Notes (Signed)
Pt more alert this time, oriented to person and place. Coughing noted with ronchi audible on auscultation. PRN breathing treatment given by respiratory therapist Nadine Counts. [name] verbalized concern and vented out about the "quality of care" pt is receiving. Writer answered questions and offered to let the charge RN know about their concerns. Charge RN Lowella Bandy made aware and spoke to pt's son at bedside. Will continue to monitor and attend pt needs.

## 2017-01-24 NOTE — Progress Notes (Signed)
Pt is lethargic and responds voice but drifts right back to sleep. Audible congestion and ronchi on the right lung upon auscultation. [name] is so upset about the change in level of consciousness and wanted to speak to an MD 'right now'. Vital signs taken, see flowsheet. CBG taken=98. Paged Dr. Tobi Bastos and made aware. Respiratory therapist Nadine Counts called to administer breathing treatment. Dr. Tobi Bastos answered page and will be coming on the floor to see pt.

## 2017-01-24 NOTE — Progress Notes (Signed)
Son remains calm. Clydie Braun, CSW continues to provide emotional support.

## 2017-01-24 NOTE — Progress Notes (Signed)
MEDICATION RELATED CONSULT NOTE - INITIAL   Pharmacy Consult for electrolyte replacement  Allergies  Allergen Reactions  . Ambien [Zolpidem Tartrate] Other (See Comments)    Reaction: altered mental status  . Amoxicillin Other (See Comments)    Reaction: blisters in mouth Has patient had a PCN reaction causing immediate rash, facial/tongue/throat swelling, SOB or lightheadedness with hypotension: Yes Has patient had a PCN reaction causing severe rash involving mucus membranes or skin necrosis: No Has patient had a PCN reaction that required hospitalization No Has patient had a PCN reaction occurring within the last 10 years: Yes If all of the above answers are "NO", then may proceed with Cephalosporin use.     Patient Measurements: Height:  (177.8 cm) Weight: 205 lb (93 kg) IBW/kg (Calculated) : 68.5  Labs:  CMP Latest Ref Rng & Units 01/24/2017 01/23/2017 01/23/2017  Glucose 65 - 99 mg/dL 147(W) - 295(A)  BUN 6 - 20 mg/dL 21(H) - 20  Creatinine 0.44 - 1.00 mg/dL 0.86 - 5.78  Sodium 469 - 145 mmol/L 136 - 135  Potassium 3.5 - 5.1 mmol/L 4.5 4.8 2.7(LL)  Chloride 101 - 111 mmol/L 101 - 101  CO2 22 - 32 mmol/L 28 - 28  Calcium 8.9 - 10.3 mg/dL 8.9 - 8.5(L)  Total Protein 6.5 - 8.1 g/dL - - 5.8(L)  Total Bilirubin 0.3 - 1.2 mg/dL - - 0.7  Alkaline Phos 38 - 126 U/L - - 101  AST 15 - 41 U/L - - 19  ALT 14 - 54 U/L - - 10(L)     Recent Labs  01/22/17 0447 01/23/17 0351 01/23/17 0359 01/24/17 0341  CREATININE 0.72  --  0.90 0.90  MG  --  1.7  --   --   ALBUMIN  --   --  1.9*  --   PROT  --   --  5.8*  --   AST  --   --  19  --   ALT  --   --  10*  --   ALKPHOS  --   --  101  --   BILITOT  --   --  0.7  --    Estimated Creatinine Clearance: 72.9 mL/min (by C-G formula based on SCr of 0.9 mg/dL).  Assessment: K: 2.7  Plan:  Ordered KCl x 2, will give KCl IV x 1, check Mg and recheck K this evening.   10/14 @ 0341 K 4.5 no further need for  supplementation, will monitor w/ routine labs.  Thomasene Ripple, PharmD, BCPS Clinical Pharmacist 01/24/2017

## 2017-01-24 NOTE — Progress Notes (Signed)
Was called back to room 158. The son was out in the hallway yelling, "Get him out." "He smiled at me." I told the son he needed to calm done. We went back into the room and I reassured him that the Psychiatrist meant no harm and he was there to help. Dr. Kathy Breach was at the bedside assessing patient and trying to help the husband and son understand the plan of care.

## 2017-01-24 NOTE — Progress Notes (Addendum)
Called by RN taking care of patient, that patients family concerned that patient is drowsy, lethargic with decreased response. Patient seen and evaluated. Respond to verbal commands. Oriented to self, place but not time. Remembers date of birth. More lethargic after being started on remeron and zyprexa according to family member. Will hold above meds, f/u with psychiatry attending for alternative options. Blood pressure stable Oxygen sat 100% Blood sugar 98 Continue breathing treatments. Occupational therapy evaluation  Reassured patients family regarding medical condition and treatment plan.

## 2017-01-24 NOTE — Progress Notes (Signed)
SUBJECTIVE: Patient is more alert and eating and comfortable   Vitals:   01/23/17 2313 01/24/17 0411 01/24/17 0454 01/24/17 0737  BP:  118/60  (!) 111/51  Pulse:  88  87  Resp:      Temp:    97.8 F (36.6 C)  TempSrc:    Oral  SpO2: 98% 100% 97% 100%  Weight:      Height:        Intake/Output Summary (Last 24 hours) at 01/24/17 1048 Last data filed at 01/24/17 1034  Gross per 24 hour  Intake              720 ml  Output              901 ml  Net             -181 ml    LABS: Basic Metabolic Panel:  Recent Labs  16/10/96 0351  01/23/17 0359 01/23/17 1717 01/24/17 0341  NA  --   --  135  --  136  K  --   < > 2.7* 4.8 4.5  CL  --   --  101  --  101  CO2  --   --  28  --  28  GLUCOSE  --   --  108*  --  123*  BUN  --   --  20  --  21*  CREATININE  --   --  0.90  --  0.90  CALCIUM  --   --  8.5*  --  8.9  MG 1.7  --   --   --   --   < > = values in this interval not displayed. Liver Function Tests:  Recent Labs  01/23/17 0359  AST 19  ALT 10*  ALKPHOS 101  BILITOT 0.7  PROT 5.8*  ALBUMIN 1.9*   No results for input(s): LIPASE, AMYLASE in the last 72 hours. CBC: No results for input(s): WBC, NEUTROABS, HGB, HCT, MCV, PLT in the last 72 hours. Cardiac Enzymes: No results for input(s): CKTOTAL, CKMB, CKMBINDEX, TROPONINI in the last 72 hours. BNP: Invalid input(s): POCBNP D-Dimer: No results for input(s): DDIMER in the last 72 hours. Hemoglobin A1C: No results for input(s): HGBA1C in the last 72 hours. Fasting Lipid Panel: No results for input(s): CHOL, HDL, LDLCALC, TRIG, CHOLHDL, LDLDIRECT in the last 72 hours. Thyroid Function Tests:  Recent Labs  01/22/17 0447  TSH 0.371   Anemia Panel: No results for input(s): VITAMINB12, FOLATE, FERRITIN, TIBC, IRON, RETICCTPCT in the last 72 hours.   PHYSICAL EXAM General: Well developed, well nourished, in no acute distress HEENT:  Normocephalic and atramatic Neck:  No JVD.  Lungs: Clear bilaterally to  auscultation and percussion. Heart: HRRR . Normal S1 and S2 without gallops or murmurs.  Abdomen: Bowel sounds are positive, abdomen soft and non-tender  Msk:  Back normal, normal gait. Normal strength and tone for age. Extremities: No clubbing, cyanosis or edema.   Neuro: Alert and oriented X 3. Psych:  Good affect, responds appropriately  TELEMETRY:Sinus rhythm  ASSESSMENT AND PLAN: Pleural pericardial effusion status post tobacco centesis with mild to moderate pericardial effusion without hemodynamic compromise. Patient is being treated conservatively for pericardial effusion and his hemodynamically stable. Advise follow-up echocardiogram upon discharge in 1 month.  Principal Problem:   Severe recurrent major depression with psychotic features (HCC) Active Problems:   Pneumonia   Subacute delirium    Jessica [name] A, MD, [facility] 01/24/2017 10:48 AM

## 2017-01-24 NOTE — Progress Notes (Signed)
Subjective: She is doing well with the foley for retention with bilateral hydro.    ROS:  Review of Systems  Constitutional: Negative for fever.  Gastrointestinal: Negative for abdominal pain.    Anti-infectives: Anti-infectives    Start     Dose/Rate Route Frequency Ordered Stop   01/23/17 1000  fluconazole (DIFLUCAN) tablet 200 mg     200 mg Oral Daily 01/22/17 1506     01/21/17 1600  ceFEPIme (MAXIPIME) 2 g in dextrose 5 % 50 mL IVPB  Status:  Discontinued     2 g 100 mL/hr over 30 Minutes Intravenous Every 8 hours 01/21/17 0853 01/22/17 1439   01/21/17 1500  vancomycin (VANCOCIN) IVPB 750 mg/150 ml premix     750 mg 150 mL/hr over 60 Minutes Intravenous  Once 01/21/17 1058 01/21/17 1808   01/20/17 1100  vancomycin (VANCOCIN) IVPB 1000 mg/200 mL premix     1,000 mg 200 mL/hr over 60 Minutes Intravenous  Once 01/20/17 1024 01/20/17 1429   01/19/17 1100  vancomycin (VANCOCIN) IVPB 1000 mg/200 mL premix     1,000 mg 200 mL/hr over 60 Minutes Intravenous STAT 01/19/17 1054 01/19/17 1231   01/19/17 0630  vancomycin (VANCOCIN) IVPB 1000 mg/200 mL premix  Status:  Discontinued     1,000 mg 200 mL/hr over 60 Minutes Intravenous Every 24 hours 01/19/17 0616 01/19/17 1054   01/16/17 0400  aztreonam (AZACTAM) 1 g in dextrose 5 % 50 mL IVPB  Status:  Discontinued     1 g 100 mL/hr over 30 Minutes Intravenous Every 8 hours 01/15/17 2023 01/15/17 2146   01/15/17 2200  ceFEPIme (MAXIPIME) 2 g in dextrose 5 % 50 mL IVPB  Status:  Discontinued     2 g 100 mL/hr over 30 Minutes Intravenous Every 8 hours 01/15/17 2146 01/21/17 0853   01/15/17 2130  fluconazole (DIFLUCAN) tablet 200 mg  Status:  Discontinued     200 mg Oral Daily 01/15/17 2127 01/22/17 1439   01/15/17 2015  aztreonam (AZACTAM) 2 g in dextrose 5 % 50 mL IVPB     2 g 100 mL/hr over 30 Minutes Intravenous  Once 01/15/17 2012 01/15/17 2139   01/15/17 2015  vancomycin (VANCOCIN) IVPB 1000 mg/200 mL premix  Status:  Discontinued      1,000 mg 200 mL/hr over 60 Minutes Intravenous  Once 01/15/17 2012 01/19/17 1054   01/15/17 0300  vancomycin (VANCOCIN) 1,250 mg in sodium chloride 0.9 % 250 mL IVPB  Status:  Discontinued     1,250 mg 166.7 mL/hr over 90 Minutes Intravenous Every 12 hours 01/15/17 2023 01/17/17 1611      Current Facility-Administered Medications  Medication Dose Route Frequency Provider Last Rate Last Dose  . acetaminophen (TYLENOL) tablet 650 mg  650 mg Oral Q6H PRN Houston Siren, MD       Or  . acetaminophen (TYLENOL) suppository 650 mg  650 mg Rectal Q6H PRN Sainani, Rolly Pancake, MD      . albuterol (PROVENTIL) (2.5 MG/3ML) 0.083% nebulizer solution 3 mL  3 mL Inhalation Q4H PRN Houston Siren, MD   3 mL at 01/24/17 0454  . amLODipine (NORVASC) tablet 2.5 mg  2.5 mg Oral Daily Houston Siren, MD   2.5 mg at 01/23/17 1610   And  . atorvastatin (LIPITOR) tablet 10 mg  10 mg Oral Daily Houston Siren, MD   10 mg at 01/23/17 0918  . aspirin EC tablet 81 mg  81 mg Oral Daily  Houston Siren, MD   81 mg at 01/23/17 0913  . docusate sodium (COLACE) capsule 100 mg  100 mg Oral BID Houston Siren, MD   100 mg at 01/23/17 2239  . enoxaparin (LOVENOX) injection 40 mg  40 mg Subcutaneous Q24H Houston Siren, MD   40 mg at 01/23/17 2239  . fluconazole (DIFLUCAN) tablet 200 mg  200 mg Oral Daily Mick Sell, MD   200 mg at 01/23/17 0920  . fluticasone (FLONASE) 50 MCG/ACT nasal spray 2 spray  2 spray Each Nare Daily PRN Houston Siren, MD      . furosemide (LASIX) tablet 40 mg  40 mg Oral Daily Caroleen Hamman, FNP   40 mg at 01/23/17 0915  . levothyroxine (SYNTHROID, LEVOTHROID) tablet 150 mcg  150 mcg Oral QAC breakfast Houston Siren, MD   150 mcg at 01/23/17 0914  . liver oil-zinc oxide (DESITIN) 40 % ointment 1 application  1 application Topical PRN Sainani, Rolly Pancake, MD      . mupirocin ointment (BACTROBAN) 2 % 1 application  1 application Nasal BID Houston Siren, MD   1  application at 01/23/17 2241  . nystatin cream (MYCOSTATIN) 1 application  1 application Topical BID Houston Siren, MD   1 application at 01/23/17 2242  . ondansetron (ZOFRAN) tablet 4 mg  4 mg Oral Q6H PRN Houston Siren, MD       Or  . ondansetron (ZOFRAN) injection 4 mg  4 mg Intravenous Q6H PRN Sainani, Rolly Pancake, MD      . polyethylene glycol (MIRALAX / GLYCOLAX) packet 17 g  17 g Oral Daily Houston Siren, MD   17 g at 01/22/17 0847  . pregabalin (LYRICA) capsule 150 mg  150 mg Oral TID Houston Siren, MD   150 mg at 01/23/17 2238  . senna-docusate (Senokot-S) tablet 2 tablet  2 tablet Oral Daily Houston Siren, MD   2 tablet at 01/23/17 0914  . venlafaxine XR (EFFEXOR-XR) 24 hr capsule 150 mg  150 mg Oral Daily Clapacs, Jackquline Denmark, MD   150 mg at 01/23/17 0921  . vitamin B-12 (CYANOCOBALAMIN) tablet 1,000 mcg  1,000 mcg Oral Daily Houston Siren, MD   1,000 mcg at 01/23/17 0912  . Vitamin D (Ergocalciferol) (DRISDOL) capsule 50,000 Units  50,000 Units Oral Q30 days Houston Siren, MD   50,000 Units at 01/16/17 0945     Objective: Vital signs in last 24 hours: Temp:  [97.8 F (36.6 C)-100.1 F (37.8 C)] 97.8 F (36.6 C) (10/14 0737) Pulse Rate:  [70-88] 87 (10/14 0737) Resp:  [14-20] 20 (10/13 1945) BP: (87-118)/(50-60) 111/51 (10/14 0737) SpO2:  [97 %-100 %] 100 % (10/14 0737)  Intake/Output from previous day: 10/13 0701 - 10/14 0700 In: 480 [P.O.:480] Out: 901 [Urine:900; Stool:1] Intake/Output this shift: No intake/output data recorded.   Physical Exam  Lab Results:  No results for input(s): WBC, HGB, HCT, PLT in the last 72 hours. BMET  Recent Labs  01/23/17 0359 01/23/17 1717 01/24/17 0341  NA 135  --  136  K 2.7* 4.8 4.5  CL 101  --  101  CO2 28  --  28  GLUCOSE 108*  --  123*  BUN 20  --  21*  CREATININE 0.90  --  0.90  CALCIUM 8.5*  --  8.9   PT/INR No results for input(s): LABPROT, INR in the last 72 hours. ABG No results for  input(s):  PHART, HCO3 in the last 72 hours.  Invalid input(s): PCO2, PO2  Studies/Results: Ct Renal Stone Study  Result Date: 01/22/2017 CLINICAL DATA:  Invasion. Recurrent urinary tract infections with candiduria. EXAM: CT ABDOMEN AND PELVIS WITHOUT CONTRAST TECHNIQUE: Multidetector CT imaging of the abdomen and pelvis was performed following the standard protocol without IV contrast. COMPARISON:  09/23/2009 pelvic CT.  01/15/2017 chest CT FINDINGS: Lower chest: Large pericardial effusion is not appreciably changed. Small dependent bilateral pleural effusions, increased on the right and stable on the left. Dependent bilateral lower lobe atelectasis. Hepatobiliary: Heterogeneous hepatic steatosis. No definite liver surface irregularity. No discrete liver mass. Layering mildly dense material in the gallbladder, suggesting tiny gallstones, no gallbladder distention, gallbladder wall thickening or pericholecystic fluid. No biliary ductal dilatation. Pancreas: Normal, with no mass or duct dilation. Spleen: Normal size. No mass. Adrenals/Urinary Tract: Normal adrenals. Moderate bilateral hydroureteronephrosis to the level of the ureterovesical junctions bilaterally. Layering stones throughout the right renal collecting system measuring up to 6 mm in the interpolar right kidney and 4 mm in the right renal pelvis. No left renal stones. Layering stones in the right pelvic ureter, largest 4 mm. No left ureteral stones. Diffuse bladder wall thickening. Scattered punctate stones in the dependent bladder. Stomach/Bowel: Grossly normal stomach. Normal caliber small bowel with no small bowel wall thickening. Appendix not discretely visualized, with no pericecal inflammatory changes . Oral contrast transits to the rectum. Moderate colorectal stool volume with distention of the rectum by stool and gas left 8.1 cm diameter. No large bowel wall thickening or significant pericolonic fat stranding. Vascular/Lymphatic: Atherosclerotic  nonaneurysmal abdominal aorta. No pathologically enlarged lymph nodes in the abdomen or pelvis. Reproductive: Status post hysterectomy, with no abnormal findings at the vaginal cuff. No adnexal mass. Other: No pneumoperitoneum, ascites or focal fluid collection. Musculoskeletal: No aggressive appearing focal osseous lesions. Diffuse osteopenia. Mild thoracolumbar spondylosis. IMPRESSION: 1. Moderate bilateral hydroureteronephrosis to the level of the ureterovesical junctions bilaterally. Layering small stones throughout the right renal collecting system and pelvic segment of the right ureter. Stones do not appear to be obstructing. Mild diffuse bladder wall thickening, suggesting acute cystitis. Punctate layering bladder stones. Findings may be due to chronic bladder voiding dysfunction versus chronic vesicoureteral reflux disease. 2. Large pericardial effusion, not appreciably changed since 01/15/2017 chest CT. 3. Small dependent bilateral pleural effusions, increased on the right and stable on the left . 4. Heterogeneous hepatic steatosis. 5. Cholelithiasis . 6.  Aortic Atherosclerosis (ICD10-I70.0). Electronically Signed   By: Delbert Phenix M.D.   On: 01/22/2017 16:08     Assessment and Plan: Bilateral hydro with possible neurogenic bladder with retention.   Doing well with foley.  Repeat renal US tomorrow.   Chronic candiduria.   Has been seen by ID.  Right renal and bladder stones.   Small and should be able to pass.   Consider repeat CT in 3-4 weeks.       LOS: 9 days    Jessica [name] 01/24/[phone] ID: [name], female   DOB: 1946-01-20, 71 y.o.   MRN: [id number]

## 2017-01-24 NOTE — Progress Notes (Signed)
The charge nurse on 1A called me to room 158 because the patient's son was very upset and wanted to speak with someone regarding his mother's care. Son at bedside talking with primary care nurse Lawson Fiscal. I entered the room and introduced myself. Son stated his mom has had multiple admissions to [facility] for recurrent UTI's, and was readmitted again on Oct 5th for what he thought was the same, and that she had also had what he described as a "glazed look" prior to admission, but nothing like this. He stated she was starting to feel better last week, but now with the altered mental status and lethargy she's not able to eat and drink adequately. He states this change began after the psychiatrist started his mom on Zyprexa and Remeron Oct 10th. He stated the psychiatrist should have never prescribed those meds without discussing it with him first, as he is her HPOA. Patient resting supine in bed, lethargic and easy to arouse at this time. VSS, skin pale warm and dry. Patient opens eyes and responds when spoken to, although drifts back off to sleep quickly after short conversations. Son says this is a drastic change and he wants some answers. I reassured him that we are concerned and are going to take excellent care of his mom. The Zyprexa and Remeron were discontinued on Oct 12th.  I assured him we would get the psychiatrist to come and talk with him, and also have the hospitalist get an Intensivist consult for another opinion. The son was appreciative and had calmed down.

## 2017-01-24 NOTE — Progress Notes (Signed)
BRIEF NOTE Patient seen and examined at bedside as requested by nursing and physician. Patient appears obtunded but answers some questions to her son. Will recommend to hold all sedating meds. Check MRI brain and EEG. Needs neuro reevaluation. No critical care or pulmonary needs at this time. Will be available as needed.  Dorothyann Gibbs MD PCCM

## 2017-01-24 NOTE — Consult Note (Addendum)
[facility] Face-to-Face Psychiatry Consult   Reason for Consult:  Consult for 71 year old woman for depression Referring Physician:  Bettey Costa Patient Identification: Jessica [name] MRN:  [id number] Principal Diagnosis: Hypoactive delirium, multifactorial.  Diagnosis:   Patient Active Problem List   Diagnosis Date Noted  . Severe recurrent major depression with psychotic features ([place]) [F33.3] 01/20/2017  . Subacute delirium [F05] 01/20/2017  . Pneumonia [J18.9] 01/15/2017  . Sepsis ([place]) [A41.9] 01/01/2017  . Pressure injury of skin [L89.90] 12/19/2016  . UTI (urinary tract infection) [N39.0] 12/18/2016  . Tachycardia [R00.0] 09/12/2016  . Acute encephalopathy [G93.40] 08/14/2016  . Hallucinations [R44.3] 07/06/2016  . Hypokalemia [E87.6] 07/06/2016  . Sinus tachycardia [R00.0] 07/06/2016  . RECTAL PAIN [K62.89] 09/04/2009  . CONSTIPATION [K59.00] 07/08/2009  . 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 PALLOR [R23.1] 01/07/2009  . DIFFICULTY IN WALKING [R26.2] 11/22/2008  . ABDOMINAL OR PELVIC SWELLING MASS OR LUMP LLQ [R19.04] 10/01/2008  . LUMBAR RADICULOPATHY [IMO0002] 09/25/2008  . HLD (hyperlipidemia) [E78.5] 12/10/2006  . ANXIETY [F41.1] 12/10/2006  . DEPRESSION [F32.9] 12/10/2006  . Essential hypertension [I10] 12/10/2006  . OSTEOARTHRITIS [M19.90] 12/10/2006  . Hypothyroidism [E03.9] 09/22/2006  . HYPERCHOLESTEROLEMIA [E78.00] 09/22/2006  . ECZEMA [L25.9] 09/22/2006    Total Time spent with patient: 30 minutes  Subjective:   Jessica [name] is a 71 y.o. female patient admitted on 10/5 with altered mental status. Later diagnosed to have pneumonia with pleural effusion and UTI. Seen initially by psychiatry on the 11th wherein patient was interactive with the psychiatrist and endorsed symptoms suggestive of major depressive disorder including depressive cognitions such as hopelessness and worthlessness.  Psychiatry was already on Effexor 225 mg  and Abilify 5 mg when she came to the hospital. Psychiatry switched her over to Zyprexa 5 mg and started on mirtazapine 7.5 mg on 10/10 which was gradually increased to 15 mg at bedtime on 10/11. Effexor was concomitantly decreased to 150 mg. Patient's mental status has been waxing and waning since admission. Primary team stopped her trazodone, mirtazapine and zyprexa on 10/12 after a period of prolonged somnolence and sedation. Thereafter patient has been a little more alert than usual, is somnolent today. Last doses of Zyprexa and mirtazapine were given on 01/22/2017.   I was contacted by the primary team today, and asked to give an update to patient's family. Patient's son has been irate because of the dose of mirtazapine was increased from 7.5g to 15 mg in one day. I spoke in detail to patient's son, and communicated to him that an optimal dose of mirtazapine is 15 mg. A combination of venlafaxine and mirtazapine is treatment recommendation for MDD which is severe. She was on trazodone, Effexor and Abilify previously for prophylaxis against MDD, recurrent.   Patient's son seems to think that patient's mental status has deteriorated further after being started on mirtazapine. I validated his concerns. I tried to engage him in a discussion to state that there could be an alternative explanation for the somnolence and waxing/waning mental status, especially because mirtazapine was stopped on 01/22/2017. Patient's mental status has been altered ever since she came into the hospital, it is possible that mirtazapine made her more sedated than usual. Half-life of mirtazapine and the human body is between 20 and 40 hours according to up-to-date. In addition patient was alert this morning and then slipped back into somnolence after that. This is atypical of sedation caused by psychotropics. Sedation caused by psychotropics is a more persistent phenomena and and does  not cause waxing and waning.   Patient was  arousable only to deep touch and pressure sensation.  This is unlike this morning wherein she was awake and alert for a brief period of time. Patient's arterial blood gas values done this afternoon are within normal limits. Her ammonia is 33.   Social history: Patient lives with her husband. Adult son also is present and I believe the adult son is the power of attorney.  Medical history: Multiple medical problems including spinal stenosis resulting in inability to walk, now with a pneumonia  Substance abuse history: Denies any current or past history of substance abuse nothing in the chart about substance abuse  Past Psychiatric History: I note that the patient has not seen a psychiatrist on any of her visits to the hospital although multiple visits to the emergency room have been prompted by visual hallucinations. Patient appears to be prone to getting delirious very easily. Has had multiple visits for hallucinations and confusion that are attributed to delirium. Being admitted to a psychiatric hospital. Denies any past suicide attempts. Denies any history of mania. Currently on Effexor extended release 225 mg a day and Abilify 2 mg per day.  Risk to Self: Is patient at risk for suicide?: No Risk to Others:   Prior Inpatient Therapy:   Prior Outpatient Therapy:    Past Medical History:  Past Medical History:  Diagnosis Date  . Chronic pain   . Depression   . HLD (hyperlipidemia)   . HTN (hypertension)   . Hypothyroidism   . Morbid obesity ([place])   . Recurrent UTI   . Spinal stenosis   . Visual hallucinations     Past Surgical History:  Procedure Laterality Date  . ABDOMINAL HYSTERECTOMY    . BACK SURGERY    . HEMORROIDECTOMY     Family History:  Family History  Problem Relation Age of Onset  . Heart disease Father   . Deep vein thrombosis Father    Family Psychiatric  History: None is identified Social History:  History  Alcohol Use No     History  Drug Use No     Social History   Social History  . Marital status: Married    Spouse name: N/A  . Number of children: N/A  . Years of education: N/A   Social History Main Topics  . Smoking status: Never Smoker  . Smokeless tobacco: Former Systems developer    Types: Snuff  . Alcohol use No  . Drug use: No  . Sexual activity: Not Asked   Other Topics Concern  . None   Social History Narrative  . None   Additional Social History:    Allergies:   Allergies  Allergen Reactions  . Ambien [Zolpidem Tartrate] Other (See Comments)    Reaction: altered mental status  . Amoxicillin Other (See Comments)    Reaction: blisters in mouth Has patient had a PCN reaction causing immediate rash, facial/tongue/throat swelling, SOB or lightheadedness with hypotension: Yes Has patient had a PCN reaction causing severe rash involving mucus membranes or skin necrosis: No Has patient had a PCN reaction that required hospitalization No Has patient had a PCN reaction occurring within the last 10 years: Yes If all of the above answers are "NO", then may proceed with Cephalosporin use.     Labs:  Results for orders placed or performed during the hospital encounter of 01/15/17 (from the past 48 hour(s))  Magnesium     Status: None   Collection Time:  01/23/17  3:51 AM  Result Value Ref Range   Magnesium 1.7 1.7 - 2.4 mg/dL  Comprehensive metabolic panel     Status: Abnormal   Collection Time: 01/23/17  3:59 AM  Result Value Ref Range   Sodium 135 135 - 145 mmol/L   Potassium 2.7 (LL) 3.5 - 5.1 mmol/L    Comment: CRITICAL RESULT CALLED TO, READ BACK BY AND VERIFIED WITH EMILY GANNON AT 0443 ON 01/23/17 RWW    Chloride 101 101 - 111 mmol/L   CO2 28 22 - 32 mmol/L   Glucose, Bld 108 (H) 65 - 99 mg/dL   BUN 20 6 - 20 mg/dL   Creatinine, Ser 0.90 0.44 - 1.00 mg/dL   Calcium 8.5 (L) 8.9 - 10.3 mg/dL   Total Protein 5.8 (L) 6.5 - 8.1 g/dL   Albumin 1.9 (L) 3.5 - 5.0 g/dL   AST 19 15 - 41 U/L   ALT 10 (L) 14 - 54 U/L    Alkaline Phosphatase 101 38 - 126 U/L   Total Bilirubin 0.7 0.3 - 1.2 mg/dL   GFR calc non Af Amer >60 >60 mL/min   GFR calc Af Amer >60 >60 mL/min    Comment: (NOTE) The eGFR has been calculated using the CKD EPI equation. This calculation has not been validated in all clinical situations. eGFR's persistently <60 mL/min signify possible Chronic Kidney Disease.    Anion gap 6 5 - 15  Potassium     Status: None   Collection Time: 01/23/17  5:17 PM  Result Value Ref Range   Potassium 4.8 3.5 - 5.1 mmol/L  Ammonia     Status: None   Collection Time: 01/23/17  5:17 PM  Result Value Ref Range   Ammonia 33 9 - 35 umol/L  Basic metabolic panel     Status: Abnormal   Collection Time: 01/24/17  3:41 AM  Result Value Ref Range   Sodium 136 135 - 145 mmol/L   Potassium 4.5 3.5 - 5.1 mmol/L   Chloride 101 101 - 111 mmol/L   CO2 28 22 - 32 mmol/L   Glucose, Bld 123 (H) 65 - 99 mg/dL   BUN 21 (H) 6 - 20 mg/dL   Creatinine, Ser 0.90 0.44 - 1.00 mg/dL   Calcium 8.9 8.9 - 10.3 mg/dL   GFR calc non Af Amer >60 >60 mL/min   GFR calc Af Amer >60 >60 mL/min    Comment: (NOTE) The eGFR has been calculated using the CKD EPI equation. This calculation has not been validated in all clinical situations. eGFR's persistently <60 mL/min signify possible Chronic Kidney Disease.    Anion gap 7 5 - 15  Glucose, capillary     Status: None   Collection Time: 01/24/17  4:16 AM  Result Value Ref Range   Glucose-Capillary 98 65 - 99 mg/dL  Blood gas, arterial     Status: None (Preliminary result)   Collection Time: 01/24/17  2:11 PM  Result Value Ref Range   FIO2 0.28    Delivery systems NASAL CANNULA    Inspiratory PAP PENDING    Expiratory PAP PENDING    pH, Arterial 7.35 7.350 - 7.450   pCO2 arterial 47 32.0 - 48.0 mmHg   pO2, Arterial 101 83.0 - 108.0 mmHg   Bicarbonate 25.9 20.0 - 28.0 mmol/L   Acid-base deficit 0.1 0.0 - 2.0 mmol/L   O2 Saturation 97.5 %   Patient temperature 37.0     Oxygen index PENDING  Collection site RIGHT RADIAL    Sample type ARTERIAL DRAW    Allens test (pass/fail) PENDING PASS    Current Facility-Administered Medications  Medication Dose Route Frequency Provider Last Rate Last Dose  . acetaminophen (TYLENOL) tablet 650 mg  650 mg Oral Q6H PRN Henreitta Leber, MD       Or  . acetaminophen (TYLENOL) suppository 650 mg  650 mg Rectal Q6H PRN Sainani, Belia Heman, MD      . albuterol (PROVENTIL) (2.5 MG/3ML) 0.083% nebulizer solution 3 mL  3 mL Inhalation Q4H PRN Henreitta Leber, MD   3 mL at 01/24/17 0454  . amLODipine (NORVASC) tablet 2.5 mg  2.5 mg Oral Daily Henreitta Leber, MD   2.5 mg at 01/24/17 1124   And  . atorvastatin (LIPITOR) tablet 10 mg  10 mg Oral Daily Henreitta Leber, MD   10 mg at 01/24/17 1121  . aspirin EC tablet 81 mg  81 mg Oral Daily Henreitta Leber, MD   81 mg at 01/24/17 1135  . docusate sodium (COLACE) capsule 100 mg  100 mg Oral BID Henreitta Leber, MD   100 mg at 01/24/17 1121  . enoxaparin (LOVENOX) injection 40 mg  40 mg Subcutaneous Q24H Henreitta Leber, MD   40 mg at 01/23/17 2239  . fluconazole (DIFLUCAN) tablet 200 mg  200 mg Oral Daily Leonel Ramsay, MD   200 mg at 01/24/17 1126  . fluticasone (FLONASE) 50 MCG/ACT nasal spray 2 spray  2 spray Each Nare Daily PRN Henreitta Leber, MD      . furosemide (LASIX) tablet 40 mg  40 mg Oral Daily Jake Bathe, FNP   40 mg at 01/24/17 1129  . levothyroxine (SYNTHROID, LEVOTHROID) tablet 150 mcg  150 mcg Oral QAC breakfast Henreitta Leber, MD   150 mcg at 01/24/17 1118  . liver oil-zinc oxide (DESITIN) 40 % ointment 1 application  1 application Topical PRN Sainani, Belia Heman, MD      . mupirocin ointment (BACTROBAN) 2 % 1 application  1 application Nasal BID Henreitta Leber, MD   1 application at 38/25/05 1000  . nystatin cream (MYCOSTATIN) 1 application  1 application Topical BID Henreitta Leber, MD   1 application at 39/76/73 1000  . ondansetron (ZOFRAN)  tablet 4 mg  4 mg Oral Q6H PRN Henreitta Leber, MD       Or  . ondansetron (ZOFRAN) injection 4 mg  4 mg Intravenous Q6H PRN Sainani, Belia Heman, MD      . polyethylene glycol (MIRALAX / GLYCOLAX) packet 17 g  17 g Oral Daily Henreitta Leber, MD   17 g at 01/22/17 0847  . pregabalin (LYRICA) capsule 150 mg  150 mg Oral TID Henreitta Leber, MD   150 mg at 01/24/17 1120  . senna-docusate (Senokot-S) tablet 2 tablet  2 tablet Oral Daily Henreitta Leber, MD   2 tablet at 01/24/17 1130  . venlafaxine XR (EFFEXOR-XR) 24 hr capsule 150 mg  150 mg Oral Daily Clapacs, John T, MD   150 mg at 01/24/17 1125  . vitamin B-12 (CYANOCOBALAMIN) tablet 1,000 mcg  1,000 mcg Oral Daily Henreitta Leber, MD   1,000 mcg at 01/24/17 1124  . Vitamin D (Ergocalciferol) (DRISDOL) capsule 50,000 Units  50,000 Units Oral Q30 days Henreitta Leber, MD   50,000 Units at 01/16/17 0945    Musculoskeletal: Strength & Muscle Tone: decreased Gait & Station:  unable to stand Patient leans: N/A  Psychiatric Specialty Exam: Physical Exam  Nursing note and vitals reviewed. Constitutional: She appears well-developed and well-nourished. No distress.  HENT:  Head: Normocephalic and atraumatic.  Respiratory: No respiratory distress.  Musculoskeletal: Normal range of motion.  Neurological: She is alert.  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   HENT: Negative.   Eyes: Negative.   Respiratory: Negative.   Cardiovascular: Negative.   Gastrointestinal: Negative.   Musculoskeletal: Negative.   Skin: Negative.   Neurological: Negative.   Psychiatric/Behavioral: Positive for hallucinations and memory loss. Negative for depression, substance abuse and suicidal ideas. The patient is nervous/anxious. The patient does not have insomnia.     Blood pressure (!) 112/59, pulse 94, temperature 97.7 F (36.5 C), temperature source Oral, resp. rate 18, height '5\' 10"'$   (1.778 m), weight 205 lb (93 kg), SpO2 100 %.Body mass index is 29.41 kg/m.  General Appearance: patient can be aroused only with deep touch and pressure. Seems sedated  Eye Contact:  None  Speech:  Unable to assess  Volume:  Unable to assess  Mood:  Unable to assess  Affect:  Unable to assess  Thought Process:  Unable to assess  Orientation:  Unable to assess  Thought Content: unable to assess  Suicidal Thoughts:  Unable to assess  Homicidal Thoughts:  Unable to assess  Memory:  Unable to assess  Sleep:        Treatment Plan Summary: 71 year old female with a history of ongoing infectious process encompassing pneumonia and UTI presenting for altered mental status evaluation. She was admitted on 01/15/2017, psychiatry was consulted on the 10th. Patient has been on a combination of Abilify 5 mg, venlafaxine to 25 mg and trazodone major depressive disorder in there past. Psychiatry was consulted on the 10th and patient was transitioned to a combination of Zyprexa and mirtazapine by slowly tapering venlafaxine for her depressive symptoms. Patient's mental status has been waxing and waning ever since she came into the hospital. Was more somnolent than usual on the 12th and mirtazapine and Zyprexa was stopped. She was arousable this morning by calling her name but now continues to be somnolent and arousable only to deep touch and pressure.  An arterial blood gas analysis done in the afternoon shows values within normal limits. Her CBC is not abnormal or indicative of an infectious process. Overt electrolyte abnormalities include hypokalemia that was evident yesterday, mildly elevated BUNs. Patient's current presentation is consistent with an encephalopathic process. Strongest diagnostic possibility seems to be hypoactive delirium which presents in this manner with waxing and waning mental status.   As opposed to hyperactive delirium, hypoactive delirium presents with somnolence and waxing and  waning mental status. Treatment of hypoactive delirium comprises frequent reorientation,and explanation of the whole process to the relatives of the patient, supportive care which includes trying to find a cause for the delirium. Approximately 50% of delirium processes that occur a hypoactive in nature (Reference article - [employer]. Hypoactive delirium. BMJ 2017; 357)  Patient's family was irate and seemed to think that her somnolence and worsening of mental status was only related to doubling the dose of mirtazapine from 7.5 mg to 15 mg.I listened and validated the family's concerns for over 30 minutes. I communicated to the family that 15 mg was just an optimal dose of mirtazapine and 5 mg of Zyprexa was too low a dose to cause excessive sedation. Considering that  she was on a similar combination of psychotropics in the past it would not be pragmatic to expect that mirtazapine and Zyprexa can along cause extreme somnolence especially when the doses was stopped more than 24 hours ago.   Delirium has a high morbidity rate and is almost akin to brain failure. Considering that no significant metabolic abnormalities can be found in this patient, a possible cause would be the ongoing processes in her lungs and urinary tract. I discussed this with Bettey Costa MD who is the primary care team attending. Culture results are expected tomorrow and rationalization of antimicrobial regimen for her UTI will follow her culture results.   # hypoactive delirium - Frequent reorientation if possible - Avoid anticholinergic medications, sedative medications, CNS depressants. - Continue search for a probable cause for the delirium CT scan brain was done when the patient was admitted and seemed unremarkable.  Psychiatry will continue to follow.   Ramond Dial, MD 01/24/2017 4:08 PM

## 2017-01-24 NOTE — Progress Notes (Signed)
CH responded to a PG for a RR. Medical Team was assessing the Pt. Husband is bedside and Son is with Case worker. CH provided silent prayer at the door. CH will follow up later in the round.    01/24/17 1600  Clinical Encounter Type  Visited With Patient;Health care provider  Visit Type Initial;Spiritual support;Code (Rapid Response)  Referral From Nurse  Consult/Referral To Chaplain  Spiritual Encounters  Spiritual Needs Prayer

## 2017-01-24 NOTE — Progress Notes (Signed)
To MRI accompanied by primary nurse Lawson Fiscal.

## 2017-01-24 NOTE — Progress Notes (Signed)
Rapid Response called due to decreased LOC. Vital signs/02 sats remain stable. Psychiatrist notified Dr. Juliene Pina. Intensivist to see patient this evening.

## 2017-01-24 NOTE — Progress Notes (Signed)
Dr. Pyreddy at bedside  

## 2017-01-25 ENCOUNTER — Telehealth: Payer: Self-pay | Admitting: Urology

## 2017-01-25 ENCOUNTER — Inpatient Hospital Stay (HOSPITAL_COMMUNITY): Payer: Medicare Other

## 2017-01-25 ENCOUNTER — Inpatient Hospital Stay: Payer: Medicare Other

## 2017-01-25 DIAGNOSIS — R4182 Altered mental status, unspecified: Secondary | ICD-10-CM

## 2017-01-25 DIAGNOSIS — N2889 Other specified disorders of kidney and ureter: Secondary | ICD-10-CM

## 2017-01-25 LAB — BLOOD GAS, ARTERIAL
Acid-base deficit: 0.1 mmol/L (ref 0.0–2.0)
Bicarbonate: 25.9 mmol/L (ref 20.0–28.0)
FIO2: 0.28
O2 Saturation: 97.5 %
Patient temperature: 37
pCO2 arterial: 47 mmHg (ref 32.0–48.0)
pH, Arterial: 7.35 (ref 7.350–7.450)
pO2, Arterial: 101 mmHg (ref 83.0–108.0)

## 2017-01-25 LAB — BASIC METABOLIC PANEL
Anion gap: 10 (ref 5–15)
BUN: 21 mg/dL — ABNORMAL HIGH (ref 6–20)
CO2: 26 mmol/L (ref 22–32)
Calcium: 8.7 mg/dL — ABNORMAL LOW (ref 8.9–10.3)
Chloride: 101 mmol/L (ref 101–111)
Creatinine, Ser: 0.79 mg/dL (ref 0.44–1.00)
GFR calc Af Amer: >60 mL/min (ref >60)
GFR calc non Af Amer: >60 mL/min (ref >60)
Glucose, Bld: 103 mg/dL — ABNORMAL HIGH (ref 65–99)
Potassium: 3.9 mmol/L (ref 3.5–5.1)
Sodium: 137 mmol/L (ref 135–145)

## 2017-01-25 MED ORDER — GERHARDT'S BUTT CREAM
TOPICAL_CREAM | Freq: Two times a day (BID) | CUTANEOUS | Status: DC
  Administered 2017-01-25 – 2017-01-26 (×4): via TOPICAL
  Administered 2017-01-27: 1 via TOPICAL
  Administered 2017-01-27 – 2017-01-31 (×8): via TOPICAL
  Administered 2017-01-31 – 2017-02-01 (×2): 1 via TOPICAL
  Administered 2017-02-01: 18:00:00 via TOPICAL
  Filled 2017-01-25 (×5): qty 1

## 2017-01-25 NOTE — Consult Note (Signed)
WOC Nurse wound consult note Reason for Consult: Chronic calloused lesion to left lateral malleolus.  SOn at bedside and stated that this "spot has been there".  He would like me to evaluate a wound that he says occurred while trying to get her to the emergency room from "shearing her bottom".  Sacrum, coccyx and upper right buttocks with deep purple linear discoloration, consistent with shearing forces.  The purple tissue is sloughing off and reveals pink moist tissue. Will order low air loss bed to improve skin microclimate.  Informed son that no disposable briefs or underpads is preferred at this time. Explained benefits of dermatherapy therapeutic linen and moisture wicking properties.  Is in agreement with this.  Had a formed stool during assessment and this was cleansed and barrier cream applied.  Removed disposable brief.  Has foley catheter in place.  Wound type: Shearing injury to buttocks and sacrum, present on admission.  Pressure Injury POA: Yes Measurement: 6 cm x 3 cm x 0.2 cm with linear abrasion, intact, to right upper buttocks:  0.3 cm x 8 cm  Left lateral malleolus intact calloused lesions, 0.3 cm will offload and monitor Wound UEA:VWUJ and moist  Intact maroon discoloration to most of area Drainage (amount, consistency, odor) scant serous weeping Periwound:intact, blanchable erythema.  Due to bed immobility, will benefit from mattress with low air loss feature.  Dressing procedure/placement/frequency: Cleanse sacral and buttocks area with soap and water.  Apply Gerhardt's butt cream twice daily.  No briefs or disposable underpads while in bed.   Low air loss bed.  Will not follow at this time.  Please re-consult if needed.  Maple Hudson RN BSN CWON Pager [phone]

## 2017-01-25 NOTE — Progress Notes (Signed)
01/25/17  Patient was out of the room today at the time of rounding. Her husband was in the room at the bedside.  With Foley catheter in place, her left-sided hydronephrosis has resolved and her right-sided hydronephrosis is now mild.  This is indeed suggestive of chronic neurogenic bladder.  Renal mass seen on CT scan was not visualized on ultrasound today. She will need definitive imaging in the form of CT abdomen with and without contrast as an outpatient which will be arranged.  Additionally, she has some layering of very small stone/milk of calcium in the right kidney. Would not recommend any intervention for this at this time.  Recommend maintaining Foley catheter at this time. We will arrange for follow-up in 3-4 weeks with CT just prior.  Seen today by Dr. Sampson Goon, sensitivities for yeast in urine pending.  Vanna Scotland, MD

## 2017-01-25 NOTE — Care Management (Signed)
LTAC screen requested by [facility] [employer].

## 2017-01-25 NOTE — Care Management (Signed)
Patient reviewed for LTAC and does not meet criteria at this time.

## 2017-01-25 NOTE — Progress Notes (Addendum)
[facility] [place] [facility]   [name]    MR#:  [id number]  DATE OF BIRTH:  May 24, 1945  SUBJECTIVE:   More alert this am Son at bedside Wants to go after discharge  REVIEW OF SYSTEMS:    Review of Systems  Constitutional: Negative for fever, chills weight loss ++malaise HENT: Negative for ear pain, nosebleeds, congestion, facial swelling, rhinorrhea, neck pain, neck stiffness and ear discharge.   Respiratory: Negative for cough, shortness of breath, wheezing  Cardiovascular: Negative for chest pain, palpitations and leg swelling.  Gastrointestinal: Negative for heartburn, abdominal pain, vomiting, diarrhea or consitpation Genitourinary: Negative for dysuria, urgency, frequency, hematuria Musculoskeletal: Negative for back pain or joint pain Neurological: Negative for dizziness, seizures, syncope, focal weakness,  numbness and headaches.  bedbound Hematological: Does not bruise/bleed easily.  Psychiatric/Behavioral:Positive for depression   Tolerating Diet: yes      DRUG ALLERGIES:   Allergies  Allergen Reactions  . Ambien [Zolpidem Tartrate] Other (See Comments)    Reaction: altered mental status  . Amoxicillin Other (See Comments)    Reaction: blisters in mouth Has patient had a PCN reaction causing immediate rash, facial/tongue/throat swelling, SOB or lightheadedness with hypotension: Yes Has patient had a PCN reaction causing severe rash involving mucus membranes or skin necrosis: No Has patient had a PCN reaction that required hospitalization No Has patient had a PCN reaction occurring within the last 10 years: Yes If all of the above answers are "NO", then may proceed with Cephalosporin use.     VITALS:  Blood pressure 134/70, pulse (!) 105, temperature 98.1 F (36.7 C), temperature source Oral, resp. rate 20, height  (1.778 m), weight 93 kg (205 lb), SpO2 99 %.  PHYSICAL EXAMINATION:  Constitutional:  Appearsobese No distress. HENT: Normocephalic. Marland Kitchen Oropharynx is clear and moist.  Eyes: Conjunctivae and EOM are normal. PERRLA, no scleral icterus.  Neck: Normal ROM. Neck supple. No JVD. No tracheal deviation. CVS: RRR, S1/S2 +, no murmurs, no gallops, no carotid bruit.  Pulmonary: Effort and breath sounds normal, no stridor, rhonchi, wheezes, rales.  Abdominal: Soft. BS +,  no distension, tenderness, rebound or guarding.  Musculoskeletal: Normal range of motion. ++2 +LEE no tenderness.  Neuro: Alert. CN 2-12 grossly intact. No focal deficits. Skin: Skin is warm and dry. No rash noted. Sacral stage 1 Psychiatric: flat depressed affect.      LABORATORY PANEL:   CBC  Recent Labs Lab 01/20/17 0317  WBC 9.6  HGB 11.5*  HCT 34.9*  PLT 311   ------------------------------------------------------------------------------------------------------------------  Chemistries   Recent Labs Lab 01/23/17 0351 01/23/17 0359  01/25/17 0416  NA  --  135  < > 137  K  --  2.7*  < > 3.9  CL  --  101  < > 101  CO2  --  28  < > 26  GLUCOSE  --  108*  < > 103*  BUN  --  20  < > 21*  CREATININE  --  0.90  < > 0.79  CALCIUM  --  8.5*  < > 8.7*  MG 1.7  --   --   --   AST  --  19  --   --   ALT  --  10*  --   --   ALKPHOS  --  101  --   --   BILITOT  --  0.7  --   --   < > = values in  this interval not displayed. ------------------------------------------------------------------------------------------------------------------  Cardiac Enzymes No results for input(s): TROPONINI in the last 168 hours. ------------------------------------------------------------------------------------------------------------------  RADIOLOGY:  Mr Brain 35 Contrast  Result Date: 01/24/2017 CLINICAL DATA:  71 year old female with decreased mental status, lethargy. EXAM: MRI HEAD WITHOUT CONTRAST TECHNIQUE: Multiplanar, multiecho pulse sequences of the brain and surrounding structures were obtained  without intravenous contrast. COMPARISON:  Head CTs 01/19/2017 and earlier. FINDINGS: Brain: No restricted diffusion to suggest acute infarction. No midline shift, mass effect, evidence of mass lesion, ventriculomegaly, extra-axial collection or acute intracranial hemorrhage. Cervicomedullary junction and pituitary are within normal limits. Scattered and occasionally patchy bilateral cerebral white matter T2 and FLAIR hyperintensity is in a nonspecific configuration. The extent is mild to moderate for age. No cortical encephalomalacia or chronic cerebral blood products. Deep gray matter nuclei, brainstem, and cerebellum are normal for age. Vascular: Major intracranial vascular flow voids are preserved. Skull and upper cervical spine: Negative. Visualized bone marrow signal is within normal limits. Sinuses/Orbits: Normal orbits soft tissues. Visualized paranasal sinuses and mastoids are stable and well pneumatized. Other: Visible internal auditory structures appear normal. Scalp and face soft tissues appear negative. IMPRESSION: 1.  No acute intracranial abnormality. 2. Mild to moderate for age nonspecific cerebral white matter signal changes, most commonly due to chronic small vessel disease. Electronically Signed   By: Odessa Fleming M.D.   On: 01/24/2017 18:39   US Renal  Result Date: 01/25/2017 CLINICAL DATA:  Hydronephrosis, bilateral, multiple urinary tract stones. EXAM: RENAL / URINARY TRACT ULTRASOUND COMPLETE COMPARISON:  Abdominal and pelvic CT scan of January 22, 2017 FINDINGS: Right Kidney: Length: 10.2 cm. There is mild hydronephrosis. Multiple stones are present. A group of stones on the right measures 11.9 cm. Left Kidney: Length: 10.3 cm. Echogenicity within normal limits. No mass or hydronephrosis visualized. An exophytic lower pole abnormality observed on the CT scan is not visible today due to obscuration by bowel gas. Bladder: The urinary bladder is decompressed with a Foley catheter. IMPRESSION:  Mild right-sided hydronephrosis with multiple stones. No definite left-sided stones. The previously demonstrated exophytic slightly hyperdense focus arising from the lower pole of the left kidney is not evident on today's study likely due to overlying bowel gas. Electronically Signed   By: David  [place] M.D.   On: 01/25/2017 09:20     ASSESSMENT AND PLAN:   71 year old female with a history of depression and chronically bed bound due to spinal stenosis who presented October 5 due to altered mental status.  1.Acute metabolic encephalopathy in the setting of pneumonia and severe depression Patient seen and evaluated by neurology. CT head is negative Encephalopathy slowly improving All sedative medications on hold due to encephalopathy. MRI brain was negative for acute CVA  2. HCAP with pleural effusion status post thoracentesis Patient has completed treatment with vancomycin and cefepime ID consultation is appreciated.  3. Bilateral hydronephrosis: As per urology patient,this is probably a chronic issue. Continue Foley catheter at this time Repeat ultrasound shows mild hydronephrosis on the right and resolved on the left Will need foley until urology follow up in 2-3 weeks.  4.recurrent urinary tract infections: Urine culture positive for Candida sent to lab cor for sensitivities. Continue fluconazole for now but may need to change to another agent Follow up with ID  5. Severe depression: Patient is being followed by psychiatry Due to severe lethargy All sedative medications including those for depression have been discontinued. Continue Effexor Family does not want psychiatrist to see patient any more.  6. Hypokalemia: Repleted   7. Pericardial effusion: Patient has been seen and evaluated by cardiology.Echocardiogram shows moderate  pericardial effusion. Patient has no signs of tamponade Patient will need outpatient follow up with Dr Welton Flakes to Reassess pericardial effusion. At  present this is asymptomatic.  8. Essential hypertension: Continue Norvasc  9. Hyperlipidemia: Continue statin  10. Sacral wound, Present on admission: Cleanse sacral and buttocks area with soap and water.  Apply Gerhardt's butt cream twice daily.  No briefs or disposable underpads while in bed.   Low air loss bed.   11. NSVT: heart rate controlled currently Cardiology following  12.  Renal Mass: Will need outpatient UROLOGY follow up.   CSW mentioned LTAC, however patient's family now very upset that this was mentioned   Management plans discussed with the patient's son and he is in agreement.    CODE STATUS: FULL  TOTAL TIME TAKING CARE OF THIS PATIENT: 24 minutes.     POSSIBLE D/C 2 days home with HHC , DEPENDING ON CLINICAL CONDITION.   Ramya Vanbergen M.D on 01/25/2017 at 12:16 PM  Between 7am to 6pm - Pager - [phone] After 6pm go to [email or web address] - Social research officer, government  [facility]  Office  [phone]  CC: Primary care physician; Center, [facility]  Note: This dictation was prepared with Nurse, children's dictation along with smaller phrase technology. Any transcriptional errors that result from this process are unintentional.

## 2017-01-25 NOTE — Progress Notes (Signed)
PT Cancellation Note  Patient Details Name: Jessica [name] MRN: [id number] DOB: [birth year]   Cancelled Treatment:    Reason Eval/Treat Not Completed: PT screened, no needs identified, will sign off; Orders received and chart reviewed.  Spoke to patient and patient's [name] who report that pt has been non-ambulatory for 12 years.  Pt is totally dependent with all functional mobility at baseline with pt's son and husband providing total assistance for bed mobility and using hoyer lift for bed to/from wheelchair transfers.  Per pt and pt's son pt is currently at baseline functionally with no need for skilled PT services.  Will complete PT order at this time.   Ovidio Hanger PT, DPT 01/25/17, 3:18 PM

## 2017-01-25 NOTE — Evaluation (Signed)
Occupational Therapy Evaluation Patient Details Name: Jessica [name] MRN: [id number] DOB: May 26, 1945 Today's Date: 01/25/2017    History of Present Illness 71 year old female with a history of depression and chronically bed bound due to spinal stenosis who presented October 5 due to altered mental status. Later diagnosed to have pneumonia with pleural effusion and UTI.    Clinical Impression   Pt seen for OT evaluation this date. Prior to hospital admission, pt was independent with self feeding and grooming tasks, requiring her spouse and son to assist with tranfers using a Hoyer lift, max assist for bathing, toileting, and dressing tasks. Spouse and son also assist with cooking/cleaning and medication management. Son present for entire evaluation. 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 Pt sleeps on a low air loss hospital bed in the home. Upon assessment, pt with improving alertness with increased lights and breakfast arriving as pt was eager to eat. Son reports that he sometimes had to assist with set up of meal (cut food, open small containers) due to decreased grip/pinch strength over time but generally pt was independent with self feeding. Pt attempted to eat with fork pt's son brought from home with spillage noted and decreased quality of movement, holding utensil in web space between thumb and 1st digit. Trialed use of built up handle on fork with improved quality of movement and decreased spillage noted. Pt was able to self feed with supervision using the built up handle on the fork approx 5 bites of breakfast before reporting she was too fatigued to continue and allowed son to assist. Pt/son educated in additional adaptive utensils to support feeding, and provided with additional built up handle. Pt would benefit from skilled OT to address noted impairments and functional limitations in self feeding and grooming tasks (see  below for any additional details) in order to maximize return to PLOF.  Upon hospital discharge, recommend pt discharge to home with home health OT services to provide education/training to address decreased strength, activity tolerance, pt/caregiver training, and AE/DME needs should the spouse and son feel confident in continuing to provide same level of assist for mobility and ADL skills.     Follow Up Recommendations  Home health OT    Equipment Recommendations  None recommended by OT;Other (comment) (built up handles provided during evaluation to improve self feeding independence)    Recommendations for Other Services       Precautions / Restrictions Precautions Precautions: Fall Restrictions Weight Bearing Restrictions: No      Mobility Bed Mobility Overal bed mobility: Needs Assistance Bed Mobility: Rolling Rolling: Max assist;+2 for physical assistance         General bed mobility comments: Max x2 for rolling side to side for toileting hygiene  Transfers                 General transfer comment: deferred, pt dep on hoyer lift at baseline, unsafe to attempt this date    Balance                                           ADL either performed or assessed with clinical judgement   ADL Overall ADL's : Needs assistance/impaired Eating/Feeding: Bed level;Set up;Moderate assistance;With adaptive utensils Eating/Feeding Details (indicate cue type and reason): pt/son educated in AE and built up handles for  utensils to improve self feeding given decreased grip and UE strength. Pt trialed with improved quality of movement and ability to access food, scoop, and bring to mouth x5 with set up to cut food and supervision before reporting too fatigued to continue self feeding and requesting son to complete self feeding. Provided pt/son with extra built up handle. Grooming: Bed level;Minimal assistance;Moderate assistance   Upper Body Bathing: Bed  level;Maximal assistance   Lower Body Bathing: Bed level;Total assistance   Upper Body Dressing : Bed level;Maximal assistance   Lower Body Dressing: Bed level;Total assistance   Toilet Transfer: Total assistance   Toileting- Clothing Manipulation and Hygiene: Total assistance;Bed level               Vision Baseline Vision/History: No visual deficits Patient Visual Report: No change from baseline Vision Assessment?: No apparent visual deficits     Perception     Praxis      Pertinent Vitals/Pain Pain Assessment: No/denies pain     Hand Dominance Right   Extremity/Trunk Assessment Upper Extremity Assessment Upper Extremity Assessment: Generalized weakness (grossly 2+/5 bilaterally, fair grip strength)   Lower Extremity Assessment Lower Extremity Assessment: Generalized weakness;Defer to PT evaluation (grossly 2-/5, swelling noted)   Cervical / Trunk Assessment Cervical / Trunk Assessment: Normal   Communication Communication Communication: Other (comment) (did not talk much, but would respond to brief yes/no questions and respond to son's questions)   Cognition Arousal/Alertness: Awake/alert Behavior During Therapy: Flat affect Overall Cognitive Status: Within Functional Limits for tasks assessed                                 General Comments: pt with improving cognition, per son's report. Pt able to appropriately follow simple commands, answer simple questions, initially lethargic but alertness improved with lights on, breakfast arrived and pt eager to eat   General Comments       Exercises     Shoulder Instructions      Home Living Family/patient expects to be discharged to:: Private residence Living Arrangements: Spouse/significant other;Children (spouse and son) Available Help at Discharge: Family;Available 24 hours/day (spouse/son have been proving care for past 12 years) Type of Home: House Home Access: Level entry                      Home Equipment: Hospital bed;Other (comment) (air hospital bed, hoyer lift for transfers)   Additional Comments: unsure if pt has wheelchair      Prior Functioning/Environment Level of Independence: Needs assistance  Gait / Transfers Assistance Needed: dep for transfers and mobility ADL's / Homemaking Assistance Needed: spouse/son provide max assist for bathing, dressing, toileting, medication mgt, cooking/cleaning; pt able to self feed and groom with set up (son cuts food)            OT Problem List: Decreased strength;Decreased activity tolerance;Decreased knowledge of use of DME or AE;Impaired UE functional use      OT Treatment/Interventions: Self-care/ADL training;Therapeutic exercise;Therapeutic activities;Energy conservation;DME and/or AE instruction;Patient/family education    OT Goals(Current goals can be found in the care plan section) Acute Rehab OT Goals Patient Stated Goal: return to PLOF OT Goal Formulation: With patient/family Time For Goal Achievement: 02/08/17 Potential to Achieve Goals: Good ADL Goals Pt Will Perform Eating: with set-up;with supervision;with adaptive utensils;bed level;with caregiver independent in assisting (for >10 bites, then PRN assist from son/spouse) Pt Will Perform Grooming: with set-up;with supervision;bed  level (wash face, brush teeth with set up and supervision)  OT Frequency: Min 2X/week   Barriers to D/C:            Co-evaluation              AM-PAC PT "6 Clicks" Daily Activity     Outcome Measure Help from another person eating meals?: A Lot Help from another person taking care of personal grooming?: A Lot Help from another person toileting, which includes using toliet, bedpan, or urinal?: Total Help from another person bathing (including washing, rinsing, drying)?: Total Help from another person to put on and taking off regular upper body clothing?: Total Help from another person to put on and taking off  regular lower body clothing?: Total 6 Click Score: 8   End of Session    Activity Tolerance: Patient tolerated treatment well Patient left: in bed;with call bell/phone within reach;with bed alarm set;with family/visitor present  OT Visit Diagnosis: Muscle weakness (generalized) (M62.81)                Time: 1610-9604 OT Time Calculation (min): 54 min Charges:  OT General Charges $OT Visit: 1 Visit OT Evaluation $OT Eval Moderate Complexity: 1 Mod OT Treatments $Self Care/Home Management : 38-52 mins G-Codes: OT G-codes **NOT FOR INPATIENT CLASS** Functional Assessment Tool Used: AM-PAC 6 Clicks Daily Activity;Clinical judgement Functional Limitation: Self care Self Care Current Status (V4098): At least 80 percent but less than 100 percent impaired, limited or restricted Self Care Goal Status (J1914): At least 60 percent but less than 80 percent impaired, limited or restricted   [name], MPH, MS, OTR/L ascom [phone] 01/25/17, 2:00 PM

## 2017-01-25 NOTE — Progress Notes (Signed)
Chaplain made a follow up visit pt. CH met with pt and Husband and son were at the bedside. Pt was awake but was not talking. Husband said he was tired, and son mentioned that pt woke up very earlier this morning and told him what she wanted to eat for breakfast. Son states pt is speaking and doing much better than she did yesterday. Husband and son are optimistic about pt's health status. CH offered emotional support, silent prayer and pastoral presence.   01/25/17 1100  Clinical Encounter Type  Visited With Patient and family together  Visit Type Follow-up  Referral From Chaplain  Consult/Referral To Chaplain  Spiritual Encounters  Spiritual Needs Prayer;Emotional

## 2017-01-25 NOTE — Progress Notes (Signed)
RN called for size wise bed. Pt husband came out of room to ask why RN mentioned "Jessica [name] height and weight on a phone call." RN explained that a Jessica [name] was never mentioned and clarified to husband that a call was made for a size wise bed and Jessica [name] height and weight were needed. Husband said "No I heard Jessica [name]." RN explained again that a Jessica [name] was never mentioned and eventually the pt husband accepted the explanation. Pt family appears anxious.  Husband home for the night and son at bedside now. Pt in no acute distress. Son in no acute distress.

## 2017-01-25 NOTE — Progress Notes (Signed)
[facility] CLINIC INFECTIOUS DISEASE PROGRESS NOTE Date of Admission:  01/15/2017     ID: Jessica [name] is a 71 y.o. female with  Candiduria Principal Problem:   Severe recurrent major depression with psychotic features (HCC) Active Problems:   Pneumonia   Subacute delirium   Subjective: No fevers, seen by urology. More alert today. Foley placed. Family at bedside. APparently was more sedate over weekend perhaps due to changes in psych meds   ROS  Eleven systems are reviewed and negative except per hpi  Medications:  Antibiotics Given (last 72 hours)    None     . amLODipine  2.5 mg Oral Daily   And  . atorvastatin  10 mg Oral Daily  . aspirin EC  81 mg Oral Daily  . docusate sodium  100 mg Oral BID  . enoxaparin (LOVENOX) injection  40 mg Subcutaneous Q24H  . fluconazole  200 mg Oral Daily  . furosemide  40 mg Oral Daily  . Gerhardt's butt cream   Topical BID  . levothyroxine  150 mcg Oral QAC breakfast  . mupirocin ointment  1 application Nasal BID  . nystatin cream  1 application Topical BID  . polyethylene glycol  17 g Oral Daily  . pregabalin  150 mg Oral TID  . senna-docusate  2 tablet Oral Daily  . venlafaxine XR  150 mg Oral Daily  . vitamin B-12  1,000 mcg Oral Daily  . Vitamin D (Ergocalciferol)  50,000 Units Oral Q30 days    Objective: Vital signs in last 24 hours: Temp:  [97.7 F (36.5 C)-98.1 F (36.7 C)] 98.1 F (36.7 C) (10/15 0832) Pulse Rate:  [92-105] 105 (10/15 0832) Resp:  [18-20] 20 (10/15 0832) BP: (101-134)/(54-70) 134/70 (10/15 0832) SpO2:  [96 %-100 %] 99 % (10/15 0832) Constitutional:  Awake, lying in bed, very sleepy but able to answer yes no questions HENT: [place]/AT, PERRLA, no scleral icterus Mouth/Throat: Oropharynx is clear and dry. No oropharyngeal exudate.  Cardiovascular: Normal rate, regular rhythm distant Pulmonary/Chest: poor effort, bil rhonchi Neck = supple, no nuchal rigidity Abdominal: Soft. obese Bowel sounds are  normal.  exhibits no distension. There is no tenderness.  Lymphadenopathy: no cervical adenopathy. No axillary adenopathy Neurological: sleepy appearing but able to interact  Skin: Skin is warm and dry. No rash noted. No erythema.  Psychiatric:flat affect Lab Results  Recent Labs  01/24/17 0341 01/25/17 0416  NA 136 137  K 4.5 3.9  CL 101 101  CO2 28 26  BUN 21* 21*  CREATININE 0.90 0.79    Microbiology: Results for orders placed or performed during the hospital encounter of 01/15/17  Blood culture (routine x 2)     Status: None   Collection Time: 01/15/17  4:04 PM  Result Value Ref Range Status   Specimen Description BLOOD BLOOD LEFT ARM  Final   Special Requests   Final    BOTTLES DRAWN AEROBIC AND ANAEROBIC Blood Culture adequate volume   Culture NO GROWTH 5 DAYS  Final   Report Status 01/20/2017 FINAL  Final  Blood culture (routine x 2)     Status: None   Collection Time: 01/15/17  4:05 PM  Result Value Ref Range Status   Specimen Description BLOOD LEFT ANTECUBITAL  Final   Special Requests   Final    BOTTLES DRAWN AEROBIC AND ANAEROBIC Blood Culture adequate volume   Culture NO GROWTH 5 DAYS  Final   Report Status 01/20/2017 FINAL  Final  Urine Culture  Status: Abnormal (Preliminary result)   Collection Time: 01/15/17  8:51 PM  Result Value Ref Range Status   Specimen Description URINE, CATHETERIZED  Final   Special Requests NONE  Final   Culture (A)  Final    >=100,000 COLONIES/mL CANDIDA PARAPSILOSIS Sent to [facility] for further susceptibility testing. PER PHYSICIAN REQUEST Performed at [facility], [address], [place], [place] [address]    Report Status PENDING  Incomplete  Body fluid culture     Status: None   Collection Time: 01/18/17  9:10 AM  Result Value Ref Range Status   Specimen Description PLEURAL  Final   Special Requests NONE  Final   Gram Stain   Final    ABUNDANT WBC PRESENT,BOTH PMN AND MONONUCLEAR NO ORGANISMS SEEN     Culture   Final    NO GROWTH 3 DAYS Performed at [facility], [address], [place], [place] [address]    Report Status 01/22/2017 FINAL  Final  MRSA PCR Screening     Status: None   Collection Time: 01/18/17  2:15 PM  Result Value Ref Range Status   MRSA by PCR NEGATIVE NEGATIVE Final    Comment:        The GeneXpert MRSA Assay (FDA approved for NASAL specimens only), is one component of a comprehensive MRSA colonization surveillance program. It is not intended to diagnose MRSA infection nor to guide or monitor treatment for MRSA infections.   Urine Culture     Status: Abnormal   Collection Time: 01/20/17 10:52 PM  Result Value Ref Range Status   Specimen Description URINE, RANDOM  Final   Special Requests Normal  Final   Culture 30,000 COLONIES/mL YEAST (A)  Final   Report Status 01/22/2017 FINAL  Final    Studies/Results: Mr Brain Wo Contrast  Result Date: 01/24/2017 CLINICAL DATA:  71 year old female with decreased mental status, lethargy. EXAM: MRI HEAD WITHOUT CONTRAST TECHNIQUE: Multiplanar, multiecho pulse sequences of the brain and surrounding structures were obtained without intravenous contrast. COMPARISON:  Head CTs 01/19/2017 and earlier. FINDINGS: Brain: No restricted diffusion to suggest acute infarction. No midline shift, mass effect, evidence of mass lesion, ventriculomegaly, extra-axial collection or acute intracranial hemorrhage. Cervicomedullary junction and pituitary are within normal limits. Scattered and occasionally patchy bilateral cerebral white matter T2 and FLAIR hyperintensity is in a nonspecific configuration. The extent is mild to moderate for age. No cortical encephalomalacia or chronic cerebral blood products. Deep gray matter nuclei, brainstem, and cerebellum are normal for age. Vascular: Major intracranial vascular flow voids are preserved. Skull and upper cervical spine: Negative. Visualized bone marrow signal is within normal limits.  Sinuses/Orbits: Normal orbits soft tissues. Visualized paranasal sinuses and mastoids are stable and well pneumatized. Other: Visible internal auditory structures appear normal. Scalp and face soft tissues appear negative. IMPRESSION: 1.  No acute intracranial abnormality. 2. Mild to moderate for age nonspecific cerebral white matter signal changes, most commonly due to chronic small vessel disease. Electronically Signed   By: Odessa Fleming M.D.   On: 01/24/2017 18:39   US Renal  Result Date: 01/25/2017 CLINICAL DATA:  Hydronephrosis, bilateral, multiple urinary tract stones. EXAM: RENAL / URINARY TRACT ULTRASOUND COMPLETE COMPARISON:  Abdominal and pelvic CT scan of January 22, 2017 FINDINGS: Right Kidney: Length: 10.2 cm. There is mild hydronephrosis. Multiple stones are present. A group of stones on the right measures 11.9 cm. Left Kidney: Length: 10.3 cm. Echogenicity within normal limits. No mass or hydronephrosis visualized. An  exophytic lower pole abnormality observed on the CT scan is not visible today due to obscuration by bowel gas. Bladder: The urinary bladder is decompressed with a Foley catheter. IMPRESSION: Mild right-sided hydronephrosis with multiple stones. No definite left-sided stones. The previously demonstrated exophytic slightly hyperdense focus arising from the lower pole of the left kidney is not evident on today's study likely due to overlying bowel gas. Electronically Signed   By: Simona Rocque  [place] M.D.   On: 01/25/2017 09:20    Assessment/Plan: RAND BOLLER is a 71 y.o. female admitted with AMS and found to have pericardial and pleural effusion as well as PNA.  She also has had recurrent UTIs with yeast for about 6 months. Candiduria is unusual in patients without indwelling urinary caths however she has had multiple positive cultures with pyuria noted on UA.  CT did reveal stones and urology had foley placed for possible neurogenic bladder Cx reveals candida parapsilosis which is  usually sensitive to fluconazole although she has been heavily treated with this and may have resistance. Sensitivities are pending. Will likely take a week.   Recommendations Cont fluconazole for 14 days today- I will follow up on sensitivities. If resistant to fluconazole would likely need amphotericin. Continue management of stones and neurogenic bladder by urology.  Thank you very much for the consult. Will follow with you.  Mick Sell   01/25/2017, 1:12 PM

## 2017-01-25 NOTE — Progress Notes (Signed)
SUBJECTIVE: Pt is alert and responding to questions. She is feeding her self this morning Pt denies chest pain and shortness of breath.   Vitals:   01/24/17 1448 01/24/17 1553 01/24/17 2058 01/25/17 0832  BP: (!) 113/56 (!) 112/59 (!) 101/54 134/70  Pulse: 94  92 (!) 105  Resp: Temp: 97.7 F (36.5 C)  98.1 F (36.7 C) 98.1 F (36.7 C)  TempSrc: Oral  Oral Oral  SpO2: 100% 100% 96% 99%  Weight:      Height:        Intake/Output Summary (Last 24 hours) at 01/25/17 1012 Last data filed at 01/25/17 1000  Gross per 24 hour  Intake              240 ml  Output             1525 ml  Net            -1285 ml    LABS: Basic Metabolic Panel:  Recent Labs  16/10/96 0351  01/24/17 0341 01/25/17 0416  NA  --   < > 136 137  K  --   < > 4.5 3.9  CL  --   < > 101 101  CO2  --   < > 28 26  GLUCOSE  --   < > 123* 103*  BUN  --   < > 21* 21*  CREATININE  --   < > 0.90 0.79  CALCIUM  --   < > 8.9 8.7*  MG 1.7  --   --   --   < > = values in this interval not displayed. Liver Function Tests:  Recent Labs  01/23/17 0359  AST 19  ALT 10*  ALKPHOS 101  BILITOT 0.7  PROT 5.8*  ALBUMIN 1.9*   No results for input(s): LIPASE, AMYLASE in the last 72 hours. CBC: No results for input(s): WBC, NEUTROABS, HGB, HCT, MCV, PLT in the last 72 hours. Cardiac Enzymes:  Recent Labs  01/24/17 1947  CKTOTAL 21*   BNP: Invalid input(s): POCBNP D-Dimer: No results for input(s): DDIMER in the last 72 hours. Hemoglobin A1C: No results for input(s): HGBA1C in the last 72 hours. Fasting Lipid Panel: No results for input(s): CHOL, HDL, LDLCALC, TRIG, CHOLHDL, LDLDIRECT in the last 72 hours. Thyroid Function Tests: No results for input(s): TSH, T4TOTAL, T3FREE, THYROIDAB in the last 72 hours.  Invalid input(s): FREET3 Anemia Panel: No results for input(s): VITAMINB12, FOLATE, FERRITIN, TIBC, IRON, RETICCTPCT in the last 72 hours.   PHYSICAL EXAM General: Well developed, well  nourished, in no acute distress HEENT:  Normocephalic and atramatic Neck:  No JVD.  Lungs: Clear bilaterally to auscultation and percussion. Heart: HRRR . Normal S1 and S2 without gallops or murmurs.  Abdomen: Bowel sounds are positive, abdomen soft and non-tender  Msk:  Back normal, normal gait. Normal strength and tone for age. Extremities: No clubbing, cyanosis or edema.   Neuro: Tremor, slow movements and slow to respond to questions Psych:  Flat affect.  TELEMETRY: Not available.  ASSESSMENT AND PLAN: Stats post thoracentesis for left pleural effusion. Mild-moderate pericardial effusion. Pt is alert and responds to questions but does not make good eye contact. Continue lasix, consider adding daily potassium supplement. Heart remains borderline tachycardic. Consider restarting metoprolol. Recommend outpatient repeat echo in 1 month for monitoring of pericardial effusion.   Principal Problem:   Severe recurrent major depression with psychotic features [facility]) Active Problems:  Pneumonia   Subacute delirium    Caroleen Hamman, NP-C 01/25/2017 10:12 AM

## 2017-01-25 NOTE — Telephone Encounter (Signed)
Please arrange for outpatient follow-up in 3-4 weeks with any provider, CT abdomen with and without contrast was ordered which should be done just prior to follow-up.  Vanna Scotland, MD

## 2017-01-25 NOTE — Consult Note (Signed)
Psychiatry: Chart reviewed. Spoke with psychiatrist on call over the weekend. Attempted to evaluate patient. Husband told me as soon as I came in the room that I was not allowed to ask the patient any questions or do anything else and that she did not want  Patient did not make any comment and it was not clear whether she was even aware that I was there. Family clearly uninterested in any further attempts at treatment of depression at least during thecurrent hospitalization. Signing off.

## 2017-01-26 LAB — BASIC METABOLIC PANEL
Anion gap: 6 (ref 5–15)
BUN: 18 mg/dL (ref 6–20)
CO2: 31 mmol/L (ref 22–32)
Calcium: 8.6 mg/dL — ABNORMAL LOW (ref 8.9–10.3)
Chloride: 101 mmol/L (ref 101–111)
Creatinine, Ser: 0.67 mg/dL (ref 0.44–1.00)
GFR calc Af Amer: >60 mL/min (ref >60)
GFR calc non Af Amer: >60 mL/min (ref >60)
Glucose, Bld: 84 mg/dL (ref 65–99)
Potassium: 3.5 mmol/L (ref 3.5–5.1)
Sodium: 138 mmol/L (ref 135–145)

## 2017-01-26 NOTE — Telephone Encounter (Signed)
Appointment has been made and scheduling has been notified and will call her when she has been discharged.  Jessica [name]

## 2017-01-26 NOTE — Progress Notes (Signed)
[facility] CLINIC INFECTIOUS DISEASE PROGRESS NOTE Date of Admission:  01/15/2017     ID: Jessica [name] is a 71 y.o. female with  Candiduria Principal Problem:   Severe recurrent major depression with psychotic features (HCC) Active Problems:   Pneumonia   Subacute delirium   Altered mental status   Subjective: No fevers, . More alert today.   ROS  Unable to obtain  Medications:  Antibiotics Given (last 72 hours)    None     . amLODipine  2.5 mg Oral Daily   And  . atorvastatin  10 mg Oral Daily  . aspirin EC  81 mg Oral Daily  . docusate sodium  100 mg Oral BID  . enoxaparin (LOVENOX) injection  40 mg Subcutaneous Q24H  . fluconazole  200 mg Oral Daily  . furosemide  40 mg Oral Daily  . Gerhardt's butt cream   Topical BID  . levothyroxine  150 mcg Oral QAC breakfast  . mupirocin ointment  1 application Nasal BID  . nystatin cream  1 application Topical BID  . polyethylene glycol  17 g Oral Daily  . pregabalin  150 mg Oral TID  . senna-docusate  2 tablet Oral Daily  . venlafaxine XR  150 mg Oral Daily  . vitamin B-12  1,000 mcg Oral Daily  . Vitamin D (Ergocalciferol)  50,000 Units Oral Q30 days    Objective: Vital signs in last 24 hours: Temp:  [98.6 F (37 C)-99.1 F (37.3 C)] 99.1 F (37.3 C) (10/16 0810) Pulse Rate:  [118-122] 122 (10/16 0810) Resp:  [18-20] 20 (10/16 0810) BP: (119-128)/(49-55) 119/54 (10/16 1055) SpO2:  [98 %-99 %] 98 % (10/16 0810) Constitutional:  Awake, lying in bed, very sleepy but able to answer yes no questions HENT: Jessica [name], PERRLA, no scleral icterus Mouth/Throat: Oropharynx is clear and dry. No oropharyngeal exudate.  Cardiovascular: Normal rate, regular rhythm distant Pulmonary/Chest: poor effort, bil rhonchi Neck = supple, no nuchal rigidity Abdominal: Soft. obese Bowel sounds are normal.  exhibits no distension. There is no tenderness.  Lymphadenopathy: no cervical adenopathy. No axillary adenopathy Neurological: sleepy  appearing but able to interact  Skin: Skin is warm and dry. No rash noted. No erythema.  Psychiatric:flat affect Lab Results  Recent Labs  01/25/17 0416 01/26/17 0414  NA 137 138  K 3.9 3.5  CL 101 101  CO2 26 31  BUN 21* 18  CREATININE 0.79 0.67    Microbiology: Results for orders placed or performed during the hospital encounter of 01/15/17  Blood culture (routine x 2)     Status: None   Collection Time: 01/15/17  4:04 PM  Result Value Ref Range Status   Specimen Description BLOOD BLOOD LEFT ARM  Final   Special Requests   Final    BOTTLES DRAWN AEROBIC AND ANAEROBIC Blood Culture adequate volume   Culture NO GROWTH 5 DAYS  Final   Report Status 01/20/2017 FINAL  Final  Blood culture (routine x 2)     Status: None   Collection Time: 01/15/17  4:05 PM  Result Value Ref Range Status   Specimen Description BLOOD LEFT ANTECUBITAL  Final   Special Requests   Final    BOTTLES DRAWN AEROBIC AND ANAEROBIC Blood Culture adequate volume   Culture NO GROWTH 5 DAYS  Final   Report Status 01/20/2017 FINAL  Final  Urine Culture     Status: Abnormal (Preliminary result)   Collection Time: 01/15/17  8:51 PM  Result Value Ref Range  Status   Specimen Description URINE, CATHETERIZED  Final   Special Requests NONE  Final   Culture (A)  Final    >=100,000 COLONIES/mL CANDIDA PARAPSILOSIS Sent to [facility] for further susceptibility testing. PER PHYSICIAN REQUEST Performed at [facility], [address] [place], [place] [address]    Report Status PENDING  Incomplete  Body fluid culture     Status: None   Collection Time: 01/18/17  9:10 AM  Result Value Ref Range Status   Specimen Description PLEURAL  Final   Special Requests NONE  Final   Gram Stain   Final    ABUNDANT WBC PRESENT,BOTH PMN AND MONONUCLEAR NO ORGANISMS SEEN    Culture   Final    NO GROWTH 3 DAYS Performed at [facility], [address], [place], [place] [address]    Report Status 01/22/2017 FINAL   Final  MRSA PCR Screening     Status: None   Collection Time: 01/18/17  2:15 PM  Result Value Ref Range Status   MRSA by PCR NEGATIVE NEGATIVE Final    Comment:        The GeneXpert MRSA Assay (FDA approved for NASAL specimens only), is one component of a comprehensive MRSA colonization surveillance program. It is not intended to diagnose MRSA infection nor to guide or monitor treatment for MRSA infections.   Urine Culture     Status: Abnormal   Collection Time: 01/20/17 10:52 PM  Result Value Ref Range Status   Specimen Description URINE, RANDOM  Final   Special Requests Normal  Final   Culture 30,000 COLONIES/mL YEAST (A)  Final   Report Status 01/22/2017 FINAL  Final    Studies/Results: Mr Brain Wo Contrast  Result Date: 01/24/2017 CLINICAL DATA:  71 year old female with decreased mental status, lethargy. EXAM: MRI HEAD WITHOUT CONTRAST TECHNIQUE: Multiplanar, multiecho pulse sequences of the brain and surrounding structures were obtained without intravenous contrast. COMPARISON:  Head CTs 01/19/2017 and earlier. FINDINGS: Brain: No restricted diffusion to suggest acute infarction. No midline shift, mass effect, evidence of mass lesion, ventriculomegaly, extra-axial collection or acute intracranial hemorrhage. Cervicomedullary junction and pituitary are within normal limits. Scattered and occasionally patchy bilateral cerebral white matter T2 and FLAIR hyperintensity is in a nonspecific configuration. The extent is mild to moderate for age. No cortical encephalomalacia or chronic cerebral blood products. Deep gray matter nuclei, brainstem, and cerebellum are normal for age. Vascular: Major intracranial vascular flow voids are preserved. Skull and upper cervical spine: Negative. Visualized bone marrow signal is within normal limits. Sinuses/Orbits: Normal orbits soft tissues. Visualized paranasal sinuses and mastoids are stable and well pneumatized. Other: Visible internal auditory  structures appear normal. Scalp and face soft tissues appear negative. IMPRESSION: 1.  No acute intracranial abnormality. 2. Mild to moderate for age nonspecific cerebral white matter signal changes, most commonly due to chronic small vessel disease. Electronically Signed   By: Odessa Fleming M.D.   On: 01/24/2017 18:39   US Renal  Result Date: 01/25/2017 CLINICAL DATA:  Hydronephrosis, bilateral, multiple urinary tract stones. EXAM: RENAL / URINARY TRACT ULTRASOUND COMPLETE COMPARISON:  Abdominal and pelvic CT scan of January 22, 2017 FINDINGS: Right Kidney: Length: 10.2 cm. There is mild hydronephrosis. Multiple stones are present. A group of stones on the right measures 11.9 cm. Left Kidney: Length: 10.3 cm. Echogenicity within normal limits. No mass or hydronephrosis visualized. An exophytic lower pole abnormality observed on the CT scan is not visible today due to obscuration by  bowel gas. Bladder: The urinary bladder is decompressed with a Foley catheter. IMPRESSION: Mild right-sided hydronephrosis with multiple stones. No definite left-sided stones. The previously demonstrated exophytic slightly hyperdense focus arising from the lower pole of the left kidney is not evident on today's study likely due to overlying bowel gas. Electronically Signed   By: Orvie Caradine  [place] M.D.   On: 01/25/2017 09:20    Assessment/Plan: LAJUANDA PENICK is a 71 y.o. female admitted with AMS and found to have pericardial and pleural effusion as well as PNA.  She also has had recurrent UTIs with yeast for about 6 months. Candiduria is unusual in patients without indwelling urinary caths however she has had multiple positive cultures with pyuria noted on UA.  CT did reveal stones and urology had foley placed for possible neurogenic bladder Cx reveals candida parapsilosis which is usually sensitive to fluconazole although she has been heavily treated with this and may have resistance. Sensitivities are pending. Will likely take a  week.   Recommendations Cont fluconazole for 14 days today- I will follow up on sensitivities. If resistant to fluconazole would likely need amphotericin. Continue management of stones and neurogenic bladder by urology.  Thank you very much for the consult. Will follow with you.  Mick Sell   01/26/2017, 2:26 PM

## 2017-01-26 NOTE — Progress Notes (Signed)
SUBJECTIVE: Patient is tachycardic and short of breath   Vitals:   01/24/17 2058 01/25/17 0832 01/25/17 1933 01/26/17 0810  BP: (!) 101/54 134/70 (!) 119/49 (!) 128/55  Pulse: 92 (!) 105 (!) 118 (!) 122  Resp: Temp: 98.1 F (36.7 C) 98.1 F (36.7 C) 98.6 F (37 C) 99.1 F (37.3 C)  TempSrc: Oral Oral  Oral  SpO2: 96% 99% 99% 98%  Weight:      Height:        Intake/Output Summary (Last 24 hours) at 01/26/17 0834 Last data filed at 01/26/17 0738  Gross per 24 hour  Intake              290 ml  Output             1400 ml  Net            -1110 ml    LABS: Basic Metabolic Panel:  Recent Labs  16/10/96 0416 01/26/17 0414  NA 137 138  K 3.9 3.5  CL 101 101  CO2 26 31  GLUCOSE 103* 84  BUN 21* 18  CREATININE 0.79 0.67  CALCIUM 8.7* 8.6*   Liver Function Tests: No results for input(s): AST, ALT, ALKPHOS, BILITOT, PROT, ALBUMIN in the last 72 hours. No results for input(s): LIPASE, AMYLASE in the last 72 hours. CBC: No results for input(s): WBC, NEUTROABS, HGB, HCT, MCV, PLT in the last 72 hours. Cardiac Enzymes:  Recent Labs  01/24/17 1947  CKTOTAL 21*   BNP: Invalid input(s): POCBNP D-Dimer: No results for input(s): DDIMER in the last 72 hours. Hemoglobin A1C: No results for input(s): HGBA1C in the last 72 hours. Fasting Lipid Panel: No results for input(s): CHOL, HDL, LDLCALC, TRIG, CHOLHDL, LDLDIRECT in the last 72 hours. Thyroid Function Tests: No results for input(s): TSH, T4TOTAL, T3FREE, THYROIDAB in the last 72 hours.  Invalid input(s): FREET3 Anemia Panel: No results for input(s): VITAMINB12, FOLATE, FERRITIN, TIBC, IRON, RETICCTPCT in the last 72 hours.   PHYSICAL EXAM General: Well developed, well nourished, in no acute distress HEENT:  Normocephalic and atramatic Neck:  No JVD.  Lungs: Clear bilaterally to auscultation and percussion. Heart: HRRR . Normal S1 and S2 without gallops or murmurs.  Abdomen: Bowel sounds are  positive, abdomen soft and non-tender  Msk:  Back normal, normal gait. Normal strength and tone for age. Extremities: No clubbing, cyanosis or edema.   Neuro: Alert and oriented X 3. Psych:  Good affect, responds appropriately  TELEMETRY:Sinus tachycardia  ASSESSMENT AND PLAN: Pericardial effusion without hemodynamic compromise or temperature not with left thoracocentesis or pleural effusion. We will get EKG as has sinus tachycardia. Advise follow-up echocardiogram upon discharge in the office to follow the pericardial effusion.  Principal Problem:   Severe recurrent major depression with psychotic features (HCC) Active Problems:   Pneumonia   Subacute delirium   Altered mental status    Chermaine Schnyder A, MD, [facility] 01/26/2017 8:34 AM

## 2017-01-26 NOTE — Care Management (Addendum)
MD paged for palliative consult to discuss goals of care with family. Per MD family is not interested in in palliative or LTAC. Administration is involved in care.

## 2017-01-26 NOTE — Care Management Important Message (Signed)
Important Message  Patient Details  Name: Jessica [name] MRN: [id number] Date of Birth: [birth year]   Medicare Important Message Given:  Yes    Collie Siad, RN 01/26/2017, 9:53 AM

## 2017-01-26 NOTE — Progress Notes (Signed)
Occupational Therapy Treatment Patient Details Name: Jessica [name] MRN: [id number] DOB: Feb 01, 1946 Today's Date: 01/26/2017    History of present illness 71 year old female with a history of depression and chronically bed bound due to spinal stenosis who presented October 5 due to altered mental status. Later diagnosed to have pneumonia with pleural effusion and UTI.    OT comments  Pt seen today for OT treatment session. Son and spouse present for duration of session. Pt more alert this date and family reports pt is improving. Pt demonstrating improved UE strength/endurance for self feeding and grooming tasks this date. Built up handles working well for improving independence with self feeding due to impaired UE and grip strength and impaired activity tolerance. Family still has to assist with self feeding some due to fatigue and decreased strength, but this is improving. Pt/family educated in home health OT services and pt expressed interest in pursuing. Will continue to progress towards OT goals.   Follow Up Recommendations  Home health OT    Equipment Recommendations       Recommendations for Other Services      Precautions / Restrictions Precautions Precautions: Fall Restrictions Weight Bearing Restrictions: No       Mobility Bed Mobility                  Transfers                      Balance                                           ADL either performed or assessed with clinical judgement   ADL Overall ADL's : Needs assistance/impaired Eating/Feeding: Minimal assistance;With adaptive utensils;Set up Eating/Feeding Details (indicate cue type and reason): Pt demonstrating improved UE strength/endurance for self feeding and grooming tasks this date. Built up handles doing well. Family still has to assist with self feeding some due to fatigue and decreased strength, but this is improving.                                         Vision Baseline Vision/History: No visual deficits Patient Visual Report: No change from baseline Vision Assessment?: No apparent visual deficits   Perception     Praxis      Cognition Arousal/Alertness: Awake/alert Behavior During Therapy: Flat affect Overall Cognitive Status: Within Functional Limits for tasks assessed                                          Exercises Other Exercises Other Exercises: Pt/son/spouse educated in home health OT services that can support pt/family with home/routines modifications to maximize pt/family safety and functional independence, DME/AE to support self care skills, and assist with self care skills and caregiver education/training as needed to support maximizing pt's safety, functional independence, and quality of life. Pt verbalizes interest in pursuing this.   Shoulder Instructions       General Comments      Pertinent Vitals/ Pain       Pain Assessment: No/denies pain  Home Living  Prior Functioning/Environment              Frequency  Min 2X/week        Progress Toward Goals  OT Goals(current goals can now be found in the care plan section)  Progress towards OT goals: Progressing toward goals  Acute Rehab OT Goals Patient Stated Goal: return to PLOF OT Goal Formulation: With patient/family Time For Goal Achievement: 02/08/17 Potential to Achieve Goals: Good  Plan Discharge plan remains appropriate;Frequency remains appropriate    Co-evaluation                 AM-PAC PT "6 Clicks" Daily Activity     Outcome Measure   Help from another person eating meals?: A Little Help from another person taking care of personal grooming?: A Little Help from another person toileting, which includes using toliet, bedpan, or urinal?: Total Help from another person bathing (including washing, rinsing, drying)?: Total Help from another  person to put on and taking off regular upper body clothing?: Total Help from another person to put on and taking off regular lower body clothing?: Total 6 Click Score: 10    End of Session    OT Visit Diagnosis: Muscle weakness (generalized) (M62.81)   Activity Tolerance Patient tolerated treatment well   Patient Left in bed;with call bell/phone within reach;with bed alarm set;with family/visitor present   Nurse Communication          Time: 1610-9604 OT Time Calculation (min): 19 min  Charges: OT General Charges $OT Visit: 1 Visit OT Treatments $Self Care/Home Management : 8-22 mins  [name], MPH, MS, OTR/L ascom [phone] 01/26/17, 3:41 PM

## 2017-01-27 ENCOUNTER — Inpatient Hospital Stay: Payer: Medicare Other

## 2017-01-27 LAB — CBC
HCT: 33.6 % — ABNORMAL LOW (ref 35.0–47.0)
Hemoglobin: 10.9 g/dL — ABNORMAL LOW (ref 12.0–16.0)
MCH: 26 pg (ref 26.0–34.0)
MCHC: 32.4 g/dL (ref 32.0–36.0)
MCV: 80.2 fL (ref 80.0–100.0)
Platelets: 292 10*3/uL (ref 150–440)
RBC: 4.18 MIL/uL (ref 3.80–5.20)
RDW: 15.7 % — ABNORMAL HIGH (ref 11.5–14.5)
WBC: 8.7 10*3/uL (ref 3.6–11.0)

## 2017-01-27 LAB — BASIC METABOLIC PANEL
Anion gap: 8 (ref 5–15)
BUN: 16 mg/dL (ref 6–20)
CO2: 30 mmol/L (ref 22–32)
Calcium: 8.5 mg/dL — ABNORMAL LOW (ref 8.9–10.3)
Chloride: 99 mmol/L — ABNORMAL LOW (ref 101–111)
Creatinine, Ser: 0.58 mg/dL (ref 0.44–1.00)
GFR calc Af Amer: >60 mL/min (ref >60)
GFR calc non Af Amer: >60 mL/min (ref >60)
Glucose, Bld: 87 mg/dL (ref 65–99)
Potassium: 3.2 mmol/L — ABNORMAL LOW (ref 3.5–5.1)
Sodium: 137 mmol/L (ref 135–145)

## 2017-01-27 MED ORDER — TRAZODONE HCL 50 MG PO TABS
50.0000 mg | ORAL_TABLET | Freq: Every day | ORAL | Status: DC
  Administered 2017-01-27 – 2017-02-02 (×7): 50 mg via ORAL
  Filled 2017-01-27 (×7): qty 1

## 2017-01-27 MED ORDER — ENSURE ENLIVE PO LIQD
237.0000 mL | Freq: Two times a day (BID) | ORAL | Status: DC
Start: 2017-01-27 — End: 2017-02-03
  Administered 2017-01-27 – 2017-02-02 (×5): 237 mL via ORAL

## 2017-01-27 NOTE — Progress Notes (Signed)
Nutrition Follow-up  DOCUMENTATION CODES:   Obesity unspecified  INTERVENTION:  1. Ensure Enlive po BID, each supplement provides 350 kcal and 20 grams of protein  NUTRITION DIAGNOSIS:   Inadequate oral intake related to acute illness as evidenced by per patient/family report, meal completion < 25%. -ongoing  GOAL:   Patient will meet greater than or equal to 90% of their needs -not meeting  MONITOR:   PO intake, I & O's, Labs, Weight trends  REASON FOR ASSESSMENT:   Low Braden    ASSESSMENT:   [name] has a PMH of HTN, HLD, morbid obesity, spinal stenosis, recurrent UTIs,  Presents with AMS, PNA, pleural effusion, pericardial effusion  s/p thoracentesis 10/8 375mL removed Chronic candiduria, BL hydronephrosis with possible neurogenic bladder with retention. Rapid response was called 01/24/2017 due to decreased LOC Shortness of breath resolved per cardiology on 2L Nasal Cannula  Spoke with Son. He is very upset about patient's care and gives long story about staff here. Only he has been feeding patient since she has been here. Unclear history of PO intake and documentation.  He states when she came in she was eating well until her medication was changed Oct 10th, after which she slept for 2 days. Appears she has been eating on and off since then. Per son, ate 1 bite of breakfast this morning. Ate 1 pancake for breakfast yesterday. Son does not give complete history. Doesn't seem to be meeting needs. Patient is more alert, states she likes ensure, husband and son corroborated this.   Will try this, see how patient does, otherwise she will need nutrition support if Son is agreeable to it. No new weights thus far. Discussed with RN.  Labs reviewed:  K 3.2,  Medications reviewed and include:  Colace, Miralax, Senokot-S, B12, Vitamin D  Diet Order:  Diet Heart Room service appropriate? Yes; Fluid consistency: Thin  Skin:  Wound (see comment) (Deep tissue injury to L  ankle)  Last BM:  01/17/2017  Height:   Ht Readings from Last 1 Encounters:  01/15/17 5\' 10"  (1.778 m)    Weight:   Wt Readings from Last 1 Encounters:  01/15/17 205 lb (93 kg)    Ideal Body Weight:  68.18 kg  BMI:  Body mass index is 29.41 kg/m.  Estimated Nutritional Needs:   Kcal:  1800-2000 calories (MSJ x1.2-1.3)  Protein:  112-130 grams  Fluid:  1.8-2L  EDUCATION NEEDS:   No education needs identified at this time  Dionne AnoWilliam M. Shakenya Stoneberg, MS, RD LDN Inpatient Clinical Dietitian Pager [phone]

## 2017-01-27 NOTE — Progress Notes (Signed)
[facility] - [place] at [facility]   [name]    MR#:  [id number]  DATE OF BIRTH:  1946-01-20  SUBJECTIVE:   More alert this am Son at bedside As per son, did not sleep last night.  REVIEW OF SYSTEMS:    Review of Systems  Constitutional: Negative for fever, chills weight loss ++malaise HENT: Negative for ear pain, nosebleeds, congestion, facial swelling, rhinorrhea, neck pain, neck stiffness and ear discharge.   Respiratory: Negative for cough, shortness of breath, wheezing  Cardiovascular: Negative for chest pain, palpitations and leg swelling.  Gastrointestinal: Negative for heartburn, abdominal pain, vomiting, diarrhea or consitpation Genitourinary: Negative for dysuria, urgency, frequency, hematuria Musculoskeletal: Negative for back pain or joint pain Neurological: Negative for dizziness, seizures, syncope, focal weakness,  numbness and headaches.  bedbound Hematological: Does not bruise/bleed easily.  Psychiatric/Behavioral:Positive for depression   Tolerating Diet: yes      DRUG ALLERGIES:   Allergies  Allergen Reactions  . Ambien [Zolpidem Tartrate] Other (See Comments)    Reaction: altered mental status  . Amoxicillin Other (See Comments)    Reaction: blisters in mouth Has patient had a PCN reaction causing immediate rash, facial/tongue/throat swelling, SOB or lightheadedness with hypotension: Yes Has patient had a PCN reaction causing severe rash involving mucus membranes or skin necrosis: No Has patient had a PCN reaction that required hospitalization No Has patient had a PCN reaction occurring within the last 10 years: Yes If all of the above answers are "NO", then may proceed with Cephalosporin use.     VITALS:  Blood pressure 121/63, pulse (!) 103, temperature 98.8 F (37.1 C), temperature source Axillary, resp. rate 16, height 5\' 10"  (1.778 m), weight 93 kg (205 lb), SpO2 97 %.  PHYSICAL EXAMINATION:   Constitutional: Appearsobese No distress. HENT: Normocephalic. Marland Kitchen. Oropharynx is clear and moist.  Eyes: Conjunctivae and EOM are normal. PERRLA, no scleral icterus.  Neck: Normal ROM. Neck supple. No JVD. No tracheal deviation. CVS: RRR, S1/S2 +, no murmurs, no gallops, no carotid bruit.  Pulmonary: Effort and breath sounds normal, no stridor, rhonchi, wheezes, rales.  Abdominal: Soft. BS +,  no distension, tenderness, rebound or guarding.  Musculoskeletal: Normal range of motion. ++2 +LEE no tenderness.  Neuro: Alert. CN 2-12 grossly intact. No focal deficits. Involuntary - jerky movements on both upper limbs and hands. Skin: Skin is warm and dry. No rash noted. Sacral stage 1 Psychiatric: flat depressed affect.      LABORATORY PANEL:   CBC  Recent Labs Lab 01/27/17 0417  WBC 8.7  HGB 10.9*  HCT 33.6*  PLT 292   ------------------------------------------------------------------------------------------------------------------  Chemistries   Recent Labs Lab 01/23/17 0351 01/23/17 0359  01/27/17 0417  NA  --  135  < > 137  K  --  2.7*  < > 3.2*  CL  --  101  < > 99*  CO2  --  28  < > 30  GLUCOSE  --  108*  < > 87  BUN  --  20  < > 16  CREATININE  --  0.90  < > 0.58  CALCIUM  --  8.5*  < > 8.5*  MG 1.7  --   --   --   AST  --  19  --   --   ALT  --  10*  --   --   ALKPHOS  --  101  --   --   BILITOT  --  0.7  --   --   < > = values in this interval not displayed. ------------------------------------------------------------------------------------------------------------------  Cardiac Enzymes No results for input(s): TROPONINI in the last 168 hours. ------------------------------------------------------------------------------------------------------------------  RADIOLOGY:  Dg Chest 1 View  Result Date: 01/27/2017 CLINICAL DATA:  Pneumonia. EXAM: CHEST 1 VIEW COMPARISON:  Radiographs of January 20, 2017. FINDINGS: Stable cardiomegaly. No pneumothorax is  noted. Mild left pleural effusion is noted with associated left basilar subsegmental atelectasis or infiltrate. Mild right basilar atelectasis is noted. Bony thorax is unremarkable. IMPRESSION: Mild left pleural effusion is noted with associated left basilar subsegmental atelectasis or infiltrate. Mild right basilar subsegmental atelectasis. Electronically Signed   By: Lupita Raider, M.D.   On: 01/27/2017 07:54     ASSESSMENT AND PLAN:   71 year old female with a history of depression and chronically bed bound due to spinal stenosis who presented October 5 due to altered mental status.  1.Acute metabolic encephalopathy in the setting of pneumonia and severe depression Patient seen and evaluated by neurology. CT head is negative Encephalopathy slowly improving All sedative medications on hold due to encephalopathy. MRI brain was negative for acute CVA   As per son, pt was on trazodone before admit. She is not having good sleep now, so he requested to restart it.  2. HCAP with pleural effusion status post thoracentesis Patient has completed treatment with vancomycin and cefepime ID consultation is appreciated.  3. Bilateral hydronephrosis: As per urology patient,this is probably a chronic issue. Continue Foley catheter at this time Repeat ultrasound shows mild hydronephrosis on the right and resolved on the left Will need foley until urology follow up in 2-3 weeks.  4.recurrent urinary tract infections: Urine culture positive for Candida sent to lab cor for sensitivities. Continue fluconazole for now but may need to change to another agent Follow up with ID  5. Severe depression: Patient is being followed by psychiatry Due to severe lethargy All sedative medications including those for depression have been discontinued. Continue Effexor Family does not want psychiatrist to see patient any more.   6. Hypokalemia: Repleted   7. Pericardial effusion: Patient has been seen and  evaluated by cardiology.Echocardiogram shows moderate  pericardial effusion. Patient has no signs of tamponade Patient will need outpatient follow up with Dr Welton Flakes to Reassess pericardial effusion. At present this is asymptomatic.  8. Essential hypertension: Continue Norvasc  9. Hyperlipidemia: Continue statin  10. Sacral wound, Present on admission: Cleanse sacral and buttocks area with soap and water.  Apply Gerhardt's butt cream twice daily.  No briefs or disposable underpads while in bed.   Low air loss bed.   11. NSVT: heart rate controlled currently Cardiology following  12.  Renal Mass: Will need outpatient UROLOGY follow up.   CSW mentioned LTAC, however patient's family now very upset that this was mentioned   Management plans discussed with the patient's son and he is in agreement.    CODE STATUS: FULL  TOTAL TIME TAKING CARE OF THIS PATIENT: 25 minutes.   POSSIBLE D/C 2 days home with HHC , DEPENDING ON CLINICAL CONDITION.   Altamese Dilling M.D on 01/26/2017 at 7:06 PM  Between 7am to 6pm - Pager - [phone] After 6pm go to [email or web address] - Social research officer, government  [facility]  Office  [phone]  CC: Primary care physician; Center, [facility]  Note: This dictation was prepared with Nurse, children's dictation along with smaller phrase technology. Any transcriptional errors that result from this process are unintentional.

## 2017-01-27 NOTE — Progress Notes (Signed)
SUBJECTIVE: Pt denies chest pain or shortness of breath.     Vitals:   01/26/17 0810 01/26/17 1055 01/26/17 1717 01/26/17 1922  BP: (!) 128/55 (!) 119/54 118/72 121/61  Pulse: (!) 122  (!) 115 (!) 103  Resp: 20  16 16   Temp: 99.1 F (37.3 C)  97.7 F (36.5 C) 98.5 F (36.9 C)  TempSrc: Oral  Oral Oral  SpO2: 98%  99% 95%  Weight:      Height:        Intake/Output Summary (Last 24 hours) at 01/27/17 0949 Last data filed at 01/27/17 [id number]  Gross per 24 hour  Intake              250 ml  Output             1350 ml  Net            -1100 ml    LABS: Basic Metabolic Panel:  Recent Labs  96/07/5408/16/18 0414 01/27/17 0417  NA 138 137  K 3.5 3.2*  CL 101 99*  CO2 31 30  GLUCOSE 84 87  BUN 18 16  CREATININE 0.67 0.58  CALCIUM 8.6* 8.5*   Liver Function Tests: No results for input(s): AST, ALT, ALKPHOS, BILITOT, PROT, ALBUMIN in the last 72 hours. No results for input(s): LIPASE, AMYLASE in the last 72 hours. CBC:  Recent Labs  01/27/17 0417  WBC 8.7  HGB 10.9*  HCT 33.6*  MCV 80.2  PLT 292   Cardiac Enzymes:  Recent Labs  01/24/17 1947  CKTOTAL 21*   BNP: Invalid input(s): POCBNP D-Dimer: No results for input(s): DDIMER in the last 72 hours. Hemoglobin A1C: No results for input(s): HGBA1C in the last 72 hours. Fasting Lipid Panel: No results for input(s): CHOL, HDL, LDLCALC, TRIG, CHOLHDL, LDLDIRECT in the last 72 hours. Thyroid Function Tests: No results for input(s): TSH, T4TOTAL, T3FREE, THYROIDAB in the last 72 hours.  Invalid input(s): FREET3 Anemia Panel: No results for input(s): VITAMINB12, FOLATE, FERRITIN, TIBC, IRON, RETICCTPCT in the last 72 hours.   PHYSICAL EXAM General: Appears weak with slight, slow movements HEENT:  Normocephalic and atramatic Neck:  No JVD.  Lungs: Clear bilaterally to auscultation and percussion. Heart: HRRR . Normal S1 and S2 without gallops or murmurs.  Abdomen: Bowel sounds are positive, abdomen soft and  non-tender  Msk:  Back normal, normal gait. Normal strength and tone for age. Extremities: No clubbing, cyanosis or edema.   Psych:  She is responsive to questions and makes eye contact.  TELEMETRY: Intermittent tachycardia, rate 100bpm  ASSESSMENT AND PLAN: Pericardial effusion without hemodynamic compromise or tamponade and history left thoracocentesis or pleural effusion. Borderline sinus tachycardia with RBBB. Advise starting metoprolol tartrate 25mg  BID for heart rate. Recommend outpatient follow-up echocardiogram upon discharge in the office to follow the pericardial effusion.  Principal Problem:   Severe recurrent major depression with psychotic features [facility](HCC) Active Problems:   Pneumonia   Subacute delirium   Altered mental status    Jessica HammanKristin Devarion Mcclanahan, NP-C 01/27/2017 9:49 AM

## 2017-01-27 NOTE — Progress Notes (Signed)
[facility] - [place] at [facility]   [name]    MR#:  [id number]  DATE OF BIRTH:  [birth year]  SUBJECTIVE:   More alert this am Son at bedside   REVIEW OF SYSTEMS:    Review of Systems  Constitutional: Negative for fever, chills weight loss ++malaise HENT: Negative for ear pain, nosebleeds, congestion, facial swelling, rhinorrhea, neck pain, neck stiffness and ear discharge.   Respiratory: Negative for cough, shortness of breath, wheezing  Cardiovascular: Negative for chest pain, palpitations and leg swelling.  Gastrointestinal: Negative for heartburn, abdominal pain, vomiting, diarrhea or consitpation Genitourinary: Negative for dysuria, urgency, frequency, hematuria Musculoskeletal: Negative for back pain or joint pain Neurological: Negative for dizziness, seizures, syncope, focal weakness,  numbness and headaches.  bedbound Hematological: Does not bruise/bleed easily.  Psychiatric/Behavioral:Positive for depression   Tolerating Diet: yes      DRUG ALLERGIES:   Allergies  Allergen Reactions  . Ambien [Zolpidem Tartrate] Other (See Comments)    Reaction: altered mental status  . Amoxicillin Other (See Comments)    Reaction: blisters in mouth Has patient had a PCN reaction causing immediate rash, facial/tongue/throat swelling, SOB or lightheadedness with hypotension: Yes Has patient had a PCN reaction causing severe rash involving mucus membranes or skin necrosis: No Has patient had a PCN reaction that required hospitalization No Has patient had a PCN reaction occurring within the last 10 years: Yes If all of the above answers are "NO", then may proceed with Cephalosporin use.     VITALS:  Blood pressure 124/64, pulse (!) 103, temperature 98.5 F (36.9 C), temperature source Oral, resp. rate 16, height 5\' 10"  (1.778 m), weight 93 kg (205 lb), SpO2 95 %.  PHYSICAL EXAMINATION:  Constitutional: Appearsobese No  distress. HENT: Normocephalic. Marland Kitchen Oropharynx is clear and moist.  Eyes: Conjunctivae and EOM are normal. PERRLA, no scleral icterus.  Neck: Normal ROM. Neck supple. No JVD. No tracheal deviation. CVS: RRR, S1/S2 +, no murmurs, no gallops, no carotid bruit.  Pulmonary: Effort and breath sounds normal, no stridor, rhonchi, wheezes, rales.  Abdominal: Soft. BS +,  no distension, tenderness, rebound or guarding.  Musculoskeletal: Normal range of motion. ++2 +LEE no tenderness.  Neuro: Alert. CN 2-12 grossly intact. No focal deficits. Skin: Skin is warm and dry. No rash noted. Sacral stage 1 Psychiatric: flat depressed affect.      LABORATORY PANEL:   CBC  Recent Labs Lab 01/27/17 0417  WBC 8.7  HGB 10.9*  HCT 33.6*  PLT 292   ------------------------------------------------------------------------------------------------------------------  Chemistries   Recent Labs Lab 01/23/17 0351 01/23/17 0359  01/27/17 0417  NA  --  135  < > 137  K  --  2.7*  < > 3.2*  CL  --  101  < > 99*  CO2  --  28  < > 30  GLUCOSE  --  108*  < > 87  BUN  --  20  < > 16  CREATININE  --  0.90  < > 0.58  CALCIUM  --  8.5*  < > 8.5*  MG 1.7  --   --   --   AST  --  19  --   --   ALT  --  10*  --   --   ALKPHOS  --  101  --   --   BILITOT  --  0.7  --   --   < > = values in this interval not displayed. ------------------------------------------------------------------------------------------------------------------  Cardiac Enzymes No results for input(s): TROPONINI in the last 168 hours. ------------------------------------------------------------------------------------------------------------------  RADIOLOGY:  Dg Chest 1 View  Result Date: 01/27/2017 CLINICAL DATA:  Pneumonia. EXAM: CHEST 1 VIEW COMPARISON:  Radiographs of January 20, 2017. FINDINGS: Stable cardiomegaly. No pneumothorax is noted. Mild left pleural effusion is noted with associated left basilar subsegmental atelectasis  or infiltrate. Mild right basilar atelectasis is noted. Bony thorax is unremarkable. IMPRESSION: Mild left pleural effusion is noted with associated left basilar subsegmental atelectasis or infiltrate. Mild right basilar subsegmental atelectasis. Electronically Signed   By: Lupita RaiderJames  Green Jr, M.D.   On: 01/27/2017 07:54     ASSESSMENT AND PLAN:   71 year old female with a history of depression and chronically bed bound due to spinal stenosis who presented October 5 due to altered mental status.  1.Acute metabolic encephalopathy in the setting of pneumonia and severe depression Patient seen and evaluated by neurology. CT head is negative Encephalopathy slowly improving All sedative medications on hold due to encephalopathy. MRI brain was negative for acute CVA  2. HCAP with pleural effusion status post thoracentesis Patient has completed treatment with vancomycin and cefepime ID consultation is appreciated.  3. Bilateral hydronephrosis: As per urology patient,this is probably a chronic issue. Continue Foley catheter at this time Repeat ultrasound shows mild hydronephrosis on the right and resolved on the left Will need foley until urology follow up in 2-3 weeks.  4.recurrent urinary tract infections: Urine culture positive for Candida sent to lab cor for sensitivities. Continue fluconazole for now but may need to change to another agent Follow up with ID  5. Severe depression: Patient is being followed by psychiatry Due to severe lethargy All sedative medications including those for depression have been discontinued. Continue Effexor Family does not want psychiatrist to see patient any more.   6. Hypokalemia: Repleted   7. Pericardial effusion: Patient has been seen and evaluated by cardiology.Echocardiogram shows moderate  pericardial effusion. Patient has no signs of tamponade Patient will need outpatient follow up with Dr Welton FlakesKhan to Reassess pericardial effusion. At present this  is asymptomatic.  8. Essential hypertension: Continue Norvasc  9. Hyperlipidemia: Continue statin  10. Sacral wound, Present on admission: Cleanse sacral and buttocks area with soap and water.  Apply Gerhardt's butt cream twice daily.  No briefs or disposable underpads while in bed.   Low air loss bed.   11. NSVT: heart rate controlled currently Cardiology following  12.  Renal Mass: Will need outpatient UROLOGY follow up.   CSW mentioned LTAC, however patient's family now very upset that this was mentioned   Management plans discussed with the patient's son and he is in agreement.    CODE STATUS: FULL  TOTAL TIME TAKING CARE OF THIS PATIENT: 24 minutes.     POSSIBLE D/C 2 days home with HHC , DEPENDING ON CLINICAL CONDITION.   Moya Duan M.D on 01/26/2017 at 1:44 PM  Between 7am to 6pm - Pager - [phone] After 6pm go to [email or web address] - Social research officer, governmentpassword EPAS [facility]  [facility]  Office  [phone]  CC: Primary care physician; [facility]  Note: This dictation was prepared with Nurse, children'sDragon dictation along with smaller phrase technology. Any transcriptional errors that result from this process are unintentional.

## 2017-01-28 LAB — URINALYSIS, ROUTINE W REFLEX MICROSCOPIC
Bilirubin Urine: NEGATIVE
Glucose, UA: NEGATIVE mg/dL
Ketones, ur: NEGATIVE mg/dL
Nitrite: NEGATIVE
Protein, ur: 30 mg/dL — AB
Specific Gravity, Urine: 1.012 (ref 1.005–1.030)
pH: 5 (ref 5.0–8.0)

## 2017-01-28 LAB — BASIC METABOLIC PANEL
Anion gap: 10 (ref 5–15)
BUN: 12 mg/dL (ref 6–20)
CO2: 30 mmol/L (ref 22–32)
Calcium: 8.5 mg/dL — ABNORMAL LOW (ref 8.9–10.3)
Chloride: 94 mmol/L — ABNORMAL LOW (ref 101–111)
Creatinine, Ser: 0.78 mg/dL (ref 0.44–1.00)
GFR calc Af Amer: >60 mL/min (ref >60)
GFR calc non Af Amer: >60 mL/min (ref >60)
Glucose, Bld: 100 mg/dL — ABNORMAL HIGH (ref 65–99)
Potassium: 3.4 mmol/L — ABNORMAL LOW (ref 3.5–5.1)
Sodium: 134 mmol/L — ABNORMAL LOW (ref 135–145)

## 2017-01-28 LAB — URINE CULTURE: Culture: >=100000 — AB

## 2017-01-28 LAB — MISC LABCORP TEST (SEND OUT): Labcorp test code: 183119

## 2017-01-28 MED ORDER — POTASSIUM CHLORIDE CRYS ER 10 MEQ PO TBCR
30.0000 meq | EXTENDED_RELEASE_TABLET | Freq: Every day | ORAL | Status: DC
  Administered 2017-01-28 – 2017-01-29 (×2): 30 meq via ORAL
  Filled 2017-01-28 (×6): qty 3

## 2017-01-28 MED ORDER — METOPROLOL TARTRATE 25 MG PO TABS
25.0000 mg | ORAL_TABLET | Freq: Two times a day (BID) | ORAL | Status: DC
  Administered 2017-01-28 – 2017-01-31 (×4): 25 mg via ORAL
  Filled 2017-01-28 (×8): qty 1

## 2017-01-28 NOTE — Progress Notes (Signed)
Occupational Therapy Treatment Patient Details Name: OBERIA BEAUDOIN MRN: [id number] DOB: 23-Apr-1945 Today's Date: 01/28/2017    History of present illness 71 year old female with a history of depression and chronically bed bound due to spinal stenosis who presented October 5 due to altered mental status. Later diagnosed to have pneumonia with pleural effusion and UTI.    OT comments  Pt seen for brief OT treatment session this date. Spouse in room. Spouse reporting that pt is "doing worse today" and notes she is declining most drink/food offered despite encouragement. Pt educated in need for water and nutritional intake to support activity tolerance and strength to improve functional independence for self feeding and grooming tasks in addition to supporting kidneys. Pt verbalizes understanding stating, "I guess" regarding importance of intake for health. Pt uninterested in participating in additional therapeutic exercise this date. Would benefit from continued OT services to address poor activity tolerance and UB strength to maximize return to PLOF. Will continue to progress.   Follow Up Recommendations  Home health OT    Equipment Recommendations       Recommendations for Other Services      Precautions / Restrictions Precautions Precautions: Fall Restrictions Weight Bearing Restrictions: No       Mobility Bed Mobility                  Transfers                      Balance                                           ADL either performed or assessed with clinical judgement   ADL Overall ADL's : Needs assistance/impaired Eating/Feeding: Maximal assistance;Bed level Eating/Feeding Details (indicate cue type and reason): Pt declining to eat or drink much this date, despite encouragement from spouse and son, reporting "I'm not hungry". Agreeable to spouse holding cup and bringing to her mouth to drink through a straw.                                           Vision Baseline Vision/History: No visual deficits Patient Visual Report: No change from baseline Vision Assessment?: No apparent visual deficits   Perception     Praxis      Cognition Arousal/Alertness: Lethargic Behavior During Therapy: Flat affect Overall Cognitive Status: Within Functional Limits for tasks assessed                                          Exercises     Shoulder Instructions       General Comments      Pertinent Vitals/ Pain       Pain Assessment: No/denies pain  Home Living                                          Prior Functioning/Environment              Frequency  Min 2X/week        Progress Toward Goals  OT  Goals(current goals can now be found in the care plan section)  Progress towards OT goals: Not progressing toward goals - comment (pt with decreasing oral intake despite encouragement from staff and family)  Acute Rehab OT Goals Patient Stated Goal: return to PLOF OT Goal Formulation: With patient/family Time For Goal Achievement: 02/08/17 Potential to Achieve Goals: Fair  Plan Discharge plan remains appropriate;Frequency remains appropriate    Co-evaluation                 AM-PAC PT "6 Clicks" Daily Activity     Outcome Measure   Help from another person eating meals?: A Lot Help from another person taking care of personal grooming?: A Lot Help from another person toileting, which includes using toliet, bedpan, or urinal?: Total Help from another person bathing (including washing, rinsing, drying)?: Total Help from another person to put on and taking off regular upper body clothing?: Total Help from another person to put on and taking off regular lower body clothing?: Total 6 Click Score: 8    End of Session    OT Visit Diagnosis: Muscle weakness (generalized) (M62.81)   Activity Tolerance Patient limited by lethargy   Patient Left in  bed;with call bell/phone within reach;with bed alarm set;with family/visitor present   Nurse Communication          Time: [id number] OT Time Calculation (min): 9 min  Charges: OT General Charges $OT Visit: 1 Visit OT Treatments $Self Care/Home Management : 8-22 mins  Richrd PrimeJamie Stiller, MPH, MS, OTR/L ascom [phone] 01/28/17, 2:33 PM

## 2017-01-28 NOTE — Progress Notes (Signed)
Nurse page CH and asked him to see pt's son who wanted to speak to someone. CH met pt, father and son at bedside. And then led pt's son to the visitors room. [name], Jessica [name], stated he is overwhelmed by what his mother has been through here in the hospital. He stated that he might losing precious dog to kidney failure and was agonizing about his mother's health challenges that are not looking good. He talked about interpersonal relationships; his struggles with work, his not having friends, and that the only friend he had was his mother and his dog. He does not have a good relationship with his dad and has not been able to keep a job for along time. He stated that he gave up his last job 12 years ago in order to take care of his mom. But now that his dog is dying and his mom's health not getting better, he feels conflicted and confused because he doesn't know what to do. [name] was in tears as he spoke and appeared regretful and confused. He sought for direction and guidance from CH. CH listened to him, validated his feels, provided support and pastoral care to him. CH will follow up pt and family as needed.   01/28/17 1500  Clinical Encounter Type  Visited With Patient;Patient and family together  Visit Type Follow-up  Referral From Chaplain  Consult/Referral To Chaplain  Spiritual Encounters  Spiritual Needs Emotional;Other (Comment)   

## 2017-01-28 NOTE — Progress Notes (Signed)
Repositioned pt x 2. Pt's spouse would not allow this writer to apply sacral foam dressing to bottom, stated a MD told him.

## 2017-01-28 NOTE — Progress Notes (Signed)
SUBJECTIVE: Pt is responsive to questions this morning. Denies chest pain. Son and husband at bedside. Son appears upset and raised his voice.   Vitals:   01/26/17 1922 01/27/17 1045 01/27/17 1630 01/27/17 2040  BP: 121/61 124/64 121/63 (!) 141/61  Pulse: (!) 103  (!) 103 (!) 102  Resp: 16  16 16   Temp: 98.5 F (36.9 C)  98.8 F (37.1 C) 98.7 F (37.1 C)  TempSrc: Oral  Axillary Oral  SpO2: 95%  97% 100%  Weight:      Height:        Intake/Output Summary (Last 24 hours) at 01/28/17 0847 Last data filed at 01/28/17 [id number]  Gross per 24 hour  Intake              240 ml  Output             1100 ml  Net             -860 ml    LABS: Basic Metabolic Panel:  Recent Labs  96/07/5408/16/18 0414 01/27/17 0417  NA 138 137  K 3.5 3.2*  CL 101 99*  CO2 31 30  GLUCOSE 84 87  BUN 18 16  CREATININE 0.67 0.58  CALCIUM 8.6* 8.5*   Liver Function Tests: No results for input(s): AST, ALT, ALKPHOS, BILITOT, PROT, ALBUMIN in the last 72 hours. No results for input(s): LIPASE, AMYLASE in the last 72 hours. CBC:  Recent Labs  01/27/17 0417  WBC 8.7  HGB 10.9*  HCT 33.6*  MCV 80.2  PLT 292   Cardiac Enzymes: No results for input(s): CKTOTAL, CKMB, CKMBINDEX, TROPONINI in the last 72 hours. BNP: Invalid input(s): POCBNP D-Dimer: No results for input(s): DDIMER in the last 72 hours. Hemoglobin A1C: No results for input(s): HGBA1C in the last 72 hours. Fasting Lipid Panel: No results for input(s): CHOL, HDL, LDLCALC, TRIG, CHOLHDL, LDLDIRECT in the last 72 hours. Thyroid Function Tests: No results for input(s): TSH, T4TOTAL, T3FREE, THYROIDAB in the last 72 hours.  Invalid input(s): FREET3 Anemia Panel: No results for input(s): VITAMINB12, FOLATE, FERRITIN, TIBC, IRON, RETICCTPCT in the last 72 hours.   PHYSICAL EXAM General: Well developed, well nourished, in no acute distress HEENT:  Normocephalic and atramatic Neck:  No JVD.  Lungs: Clear bilaterally to auscultation and  percussion. Heart: HRRR . Normal S1 and S2 without gallops or murmurs.  Abdomen: Bowel sounds are positive, abdomen soft and non-tender  Msk:  Back normal, normal gait. Normal strength and tone for age. Extremities: No clubbing, cyanosis or edema.   Neuro: Alert and oriented X 1 Psych:  Good affect, responds appropriately  Telemetry: not available  ASSESSMENT AND PLAN: Pericardial effusion without hemodynamic compromise or tamponade and history left thoracocentesis or pleural effusion.  Borderline sinus tachycardia with RBBB. Started metoprolol tartrate 25mg  BID for heart rate. Blood pressure is stable but may hold lasix if blood pressure dips.  The pts son and husband were upset about her hypokalemia and heart rate and possible UTI. I deferred discussing mental status and UTI but I spent a lengthy amount of time discussing the patients cardiac status with her husband and reassuring them. Recommend outpatient follow-up echocardiogram upon discharge in the office to follow the pericardial effusion.   Principal Problem:   Severe recurrent major depression with psychotic features [facility](HCC) Active Problems:   Pneumonia   Subacute delirium   Altered mental status    Caroleen HammanKristin Jayme Mednick, NP-C 01/28/2017 8:47 AM

## 2017-01-28 NOTE — Progress Notes (Signed)
[facility] - [place] [facility]   [name]    MR#:  [id number]  DATE OF BIRTH:  21-Aug-1945  SUBJECTIVE:   More alert this am Son at bedside As per son, did not sleep last night. Had some confusion also last night.   REVIEW OF SYSTEMS:    Review of Systems  Constitutional: Negative for fever, chills weight loss ++malaise HENT: Negative for ear pain, nosebleeds, congestion, facial swelling, rhinorrhea, neck pain, neck stiffness and ear discharge.   Respiratory: Negative for cough, shortness of breath, wheezing  Cardiovascular: Negative for chest pain, palpitations and leg swelling.  Gastrointestinal: Negative for heartburn, abdominal pain, vomiting, diarrhea or consitpation Genitourinary: Negative for dysuria, urgency, frequency, hematuria Musculoskeletal: Negative for back pain or joint pain Neurological: Negative for dizziness, seizures, syncope, focal weakness,  numbness and headaches.  bedbound Hematological: Does not bruise/bleed easily.  Psychiatric/Behavioral:Positive for depression   Tolerating Diet: yes      DRUG ALLERGIES:   Allergies  Allergen Reactions  . Ambien [Zolpidem Tartrate] Other (See Comments)    Reaction: altered mental status  . Amoxicillin Other (See Comments)    Reaction: blisters in mouth Has patient had a PCN reaction causing immediate rash, facial/tongue/throat swelling, SOB or lightheadedness with hypotension: Yes Has patient had a PCN reaction causing severe rash involving mucus membranes or skin necrosis: No Has patient had a PCN reaction that required hospitalization No Has patient had a PCN reaction occurring within the last 10 years: Yes If all of the above answers are "NO", then may proceed with Cephalosporin use.     VITALS:  Blood pressure 102/60, pulse 83, temperature 98.8 F (37.1 C), temperature source Oral, resp. rate 16, height 5\' 10"  (1.778 m), weight 93 kg (205 lb), SpO2 99  %.  PHYSICAL EXAMINATION:  Constitutional: Appears obese No distress. HENT: Normocephalic. Marland Kitchen. Oropharynx is clear and moist.  Eyes: Conjunctivae and EOM are normal. PERRLA, no scleral icterus.  Neck: Normal ROM. Neck supple. No JVD. No tracheal deviation. CVS: RRR, S1/S2 +, no murmurs,  Pulmonary: Effort and breath sounds normal, no stridor, rhonchi, wheezes, rales.  Abdominal: Soft. BS +,  no distension, tenderness, rebound or guarding.  Musculoskeletal: Normal range of motion. ++2 +LEE no tenderness.  Neuro: Alert. CN 2-12 grossly intact. No focal deficits. Involuntary - jerky movements on both upper limbs and hands. Skin: Skin is warm and dry. No rash noted. Sacral stage 1 Psychiatric: flat depressed affect.    LABORATORY PANEL:   CBC  Recent Labs Lab 01/27/17 0417  WBC 8.7  HGB 10.9*  HCT 33.6*  PLT 292   ------------------------------------------------------------------------------------------------------------------  Chemistries   Recent Labs Lab 01/23/17 0351 01/23/17 0359  01/28/17 0843  NA  --  135  < > 134*  K  --  2.7*  < > 3.4*  CL  --  101  < > 94*  CO2  --  28  < > 30  GLUCOSE  --  108*  < > 100*  BUN  --  20  < > 12  CREATININE  --  0.90  < > 0.78  CALCIUM  --  8.5*  < > 8.5*  MG 1.7  --   --   --   AST  --  19  --   --   ALT  --  10*  --   --   ALKPHOS  --  101  --   --   BILITOT  --  0.7  --   --   < > = values in this interval not displayed. ------------------------------------------------------------------------------------------------------------------  Cardiac Enzymes No results for input(s): TROPONINI in the last 168 hours. ------------------------------------------------------------------------------------------------------------------  RADIOLOGY:  Dg Chest 1 View  Result Date: 01/27/2017 CLINICAL DATA:  Pneumonia. EXAM: CHEST 1 VIEW COMPARISON:  Radiographs of January 20, 2017. FINDINGS: Stable cardiomegaly. No pneumothorax is  noted. Mild left pleural effusion is noted with associated left basilar subsegmental atelectasis or infiltrate. Mild right basilar atelectasis is noted. Bony thorax is unremarkable. IMPRESSION: Mild left pleural effusion is noted with associated left basilar subsegmental atelectasis or infiltrate. Mild right basilar subsegmental atelectasis. Electronically Signed   By: Lupita Raider, M.D.   On: 01/27/2017 07:54    ASSESSMENT AND PLAN:   71 year old female with a history of depression and chronically bed bound due to spinal stenosis who presented October 5 due to altered mental status.  1.Acute metabolic encephalopathy in the setting of pneumonia and severe depression Patient seen and evaluated by neurology. CT head is negative Encephalopathy slowly improving All sedative medications on hold due to encephalopathy.  MRI brain was negative for acute CVA   As per son, pt was on trazodone before admit. She is not having good sleep now, so he requested to restart it.  2. HCAP with pleural effusion status post thoracentesis Patient has completed treatment with vancomycin and cefepime ID consultation is appreciated.  3. Bilateral hydronephrosis: As per urology patient,this is probably a chronic issue. Continue Foley catheter at this time Repeat ultrasound shows mild hydronephrosis on the right and resolved on the left Will need foley until urology follow up in 2-3 weeks after discharge.  4.recurrent urinary tract infections: Urine culture positive for Candida sent to lab cor for sensitivities. Continue fluconazole for now but may need to change to another agent Follow up with ID Requested ID today to speak to family, on their request to speak to him.  5. Severe depression: Patient is being followed by psychiatry Due to severe lethargy All sedative medications including those for depression have been discontinued. Continue Effexor Family does not want psychiatrist to see patient any  more.   6. Hypokalemia: Repleted    Again was low, pt was on daily Potassium at home, resume KCl daily.  7. Pericardial effusion: Patient has been seen and evaluated by cardiology.Echocardiogram shows moderate  pericardial effusion. Patient has no signs of tamponade Patient will need outpatient follow up with Dr Welton Flakes to Reassess pericardial effusion. At present this is asymptomatic.   Cardiology is following daily inpatient.  8. Essential hypertension: Continue Norvasc   Added metoprolol due to tachycardia.  9. Hyperlipidemia: Continue statin  10. Sacral wound, Present on admission: Cleanse sacral and buttocks area with soap and water.  Apply Gerhardt's butt cream twice daily.  No briefs or disposable underpads while in bed.   Low air loss bed.   11. NSVT: heart rate controlled currently Cardiology following  12.  Renal Mass: Will need outpatient UROLOGY follow up.   CSW mentioned LTAC, however patient's family now very upset that this was mentioned   Management plans discussed with the patient's son and he is in agreement. Waiting for Ur cx result and Guide by ID for discharge.   CODE STATUS: FULL  TOTAL TIME TAKING CARE OF THIS PATIENT: 25 minutes.   POSSIBLE D/C 2 days home with HHC , DEPENDING ON CLINICAL CONDITION.   Altamese Dilling M.D on 01/26/2017 at 8:17 PM  Between 7am to 6pm -  Pager - [phone] After 6pm go to [email or web address] - Social research officer, government  [facility]  Office  [phone]  CC: Primary care physician; Center, [facility]  Note: This dictation was prepared with Nurse, children's dictation along with smaller phrase technology. Any transcriptional errors that result from this process are unintentional.

## 2017-01-28 NOTE — Progress Notes (Signed)
Per conversation with son (HPOA), he wanted me to tell him everything regarding his mother. Discussed medications that I had orders to administer. He was in agreement and ok to give. He questioned why there not an order for potassium. I told him that this was out of my scope of practice and would contact rounding MD to have him address. Text paged Elisabeth PigeonVachhani, new orders for lab draws. Per cardiology PA, held lasix this am, new orders for metop, and potassium.

## 2017-01-29 LAB — BASIC METABOLIC PANEL
Anion gap: 8 (ref 5–15)
BUN: 12 mg/dL (ref 6–20)
CO2: 31 mmol/L (ref 22–32)
Calcium: 8.6 mg/dL — ABNORMAL LOW (ref 8.9–10.3)
Chloride: 98 mmol/L — ABNORMAL LOW (ref 101–111)
Creatinine, Ser: 0.61 mg/dL (ref 0.44–1.00)
GFR calc Af Amer: >60 mL/min (ref >60)
GFR calc non Af Amer: >60 mL/min (ref >60)
Glucose, Bld: 88 mg/dL (ref 65–99)
Potassium: 3.9 mmol/L (ref 3.5–5.1)
Sodium: 137 mmol/L (ref 135–145)

## 2017-01-29 LAB — CBC
HCT: 36.2 % (ref 35.0–47.0)
Hemoglobin: 12 g/dL (ref 12.0–16.0)
MCH: 26.6 pg (ref 26.0–34.0)
MCHC: 33 g/dL (ref 32.0–36.0)
MCV: 80.4 fL (ref 80.0–100.0)
Platelets: 320 10*3/uL (ref 150–440)
RBC: 4.5 MIL/uL (ref 3.80–5.20)
RDW: 15.6 % — ABNORMAL HIGH (ref 11.5–14.5)
WBC: 8.9 10*3/uL (ref 3.6–11.0)

## 2017-01-29 LAB — URINE CULTURE: Culture: 40000 — AB

## 2017-01-29 MED ORDER — HALOPERIDOL LACTATE 5 MG/ML IJ SOLN
2.0000 mg | Freq: Once | INTRAMUSCULAR | Status: DC
  Filled 2017-01-29: qty 1

## 2017-01-29 MED ORDER — DEXTROSE 5 % IV SOLN
400.0000 mg | INTRAVENOUS | Status: DC
  Filled 2017-01-29 (×3): qty 80

## 2017-01-29 MED ORDER — HALOPERIDOL LACTATE 5 MG/ML IJ SOLN
2.0000 mg | Freq: Three times a day (TID) | INTRAMUSCULAR | Status: DC | PRN
Start: 2017-01-29 — End: 2017-02-03
  Administered 2017-01-29: 2 mg via INTRAVENOUS
  Filled 2017-01-29: qty 1
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
## 2017-01-29 NOTE — Progress Notes (Signed)
SUBJECTIVE: Pt denies chest and her breathing is normal.   Vitals:   01/27/17 1630 01/27/17 2040 01/28/17 1953 01/29/17 0413  BP: 121/63 (!) 141/61 102/60 125/67  Pulse: (!) 103 (!) 102 83 (!) 104  Resp: 16 16 16    Temp: 98.8 F (37.1 C) 98.7 F (37.1 C) 98.8 F (37.1 C) 98.1 F (36.7 C)  TempSrc: Axillary Oral Oral Oral  SpO2: 97% 100% 99% 92%  Weight:      Height:       No intake or output data in the 24 hours ending 01/29/17 0847  LABS: Basic Metabolic Panel:  Recent Labs  16/01/9609/18/18 0843 01/29/17 0453  NA 134* 137  K 3.4* 3.9  CL 94* 98*  CO2 30 31  GLUCOSE 100* 88  BUN 12 12  CREATININE 0.78 0.61  CALCIUM 8.5* 8.6*   Liver Function Tests: No results for input(s): AST, ALT, ALKPHOS, BILITOT, PROT, ALBUMIN in the last 72 hours. No results for input(s): LIPASE, AMYLASE in the last 72 hours. CBC:  Recent Labs  01/27/17 0417 01/29/17 0453  WBC 8.7 8.9  HGB 10.9* 12.0  HCT 33.6* 36.2  MCV 80.2 80.4  PLT 292 320   Cardiac Enzymes: No results for input(s): CKTOTAL, CKMB, CKMBINDEX, TROPONINI in the last 72 hours. BNP: Invalid input(s): POCBNP D-Dimer: No results for input(s): DDIMER in the last 72 hours. Hemoglobin A1C: No results for input(s): HGBA1C in the last 72 hours. Fasting Lipid Panel: No results for input(s): CHOL, HDL, LDLCALC, TRIG, CHOLHDL, LDLDIRECT in the last 72 hours. Thyroid Function Tests: No results for input(s): TSH, T4TOTAL, T3FREE, THYROIDAB in the last 72 hours.  Invalid input(s): FREET3 Anemia Panel: No results for input(s): VITAMINB12, FOLATE, FERRITIN, TIBC, IRON, RETICCTPCT in the last 72 hours.   PHYSICAL EXAM General: Malaise HEENT:  Normocephalic and atramatic Neck:  No JVD.  Lungs: Clear bilaterally to auscultation and percussion. Heart: Tachycardic Abdomen: Bowel sounds are positive, abdomen soft and non-tender  Msk:  Back normal, normal gait. Normal strength and tone for age. Extremities: Mild LE edema Neuro:  Alert and oriented X 1. Psych: Positive for depression  TELEMETRY: Sinus tach 107bpm  ASSESSMENT AND PLAN: History of mild-moderate pericardial effusion and pleural effusions.  Currently blood pressure stable, electrolyte panel improving. Heart rate still borderline tachycardic. One run non-sustained SVT last night noted. Continue metoprolol and lasix and potassium. Recommend outpatient echo for follow up.   Principal Problem:   Severe recurrent major depression with psychotic features [facility](HCC) Active Problems:   Pneumonia   Subacute delirium   Altered mental status    Caroleen HammanKristin Anissa Abbs, NP-C 01/29/2017 8:47 AM

## 2017-01-29 NOTE — Progress Notes (Signed)
[facility] CLINIC INFECTIOUS DISEASE PROGRESS NOTE Date of Admission:  01/15/2017     ID: ZOEY BIDWELL is a 71 y.o. female with  Candiduria Principal Problem:   Severe recurrent major depression with psychotic features (HCC) Active Problems:   Pneumonia   Subacute delirium   Altered mental status   Subjective: Some confusion overnight, got haldol. No fevers. Son and husband at bedside.   ROS  Unable to obtain  Medications:  Antibiotics Given (last 72 hours)    None     . amLODipine  2.5 mg Oral Daily   And  . atorvastatin  10 mg Oral Daily  . aspirin EC  81 mg Oral Daily  . docusate sodium  100 mg Oral BID  . enoxaparin (LOVENOX) injection  40 mg Subcutaneous Q24H  . feeding supplement (ENSURE ENLIVE)  237 mL Oral BID BM  . fluconazole  200 mg Oral Daily  . furosemide  40 mg Oral Daily  . Gerhardt's butt cream   Topical BID  . haloperidol lactate  2 mg Intravenous Once  . levothyroxine  150 mcg Oral QAC breakfast  . metoprolol tartrate  25 mg Oral BID  . mupirocin ointment  1 application Nasal BID  . nystatin cream  1 application Topical BID  . polyethylene glycol  17 g Oral Daily  . potassium chloride  30 mEq Oral Daily  . pregabalin  150 mg Oral TID  . senna-docusate  2 tablet Oral Daily  . traZODone  50 mg Oral QHS  . venlafaxine XR  150 mg Oral Daily  . vitamin B-12  1,000 mcg Oral Daily  . Vitamin D (Ergocalciferol)  50,000 Units Oral Q30 days    Objective: Vital signs in last 24 hours: Temp:  [98.1 F (36.7 C)-98.8 F (37.1 C)] 98.1 F (36.7 C) (10/19 0413) Pulse Rate:  [83-104] 104 (10/19 0413) Resp:  [16] 16 (10/18 1953) BP: (102-125)/(60-67) 125/67 (10/19 0413) SpO2:  [92 %-99 %] 92 % (10/19 0413) Constitutional:  Awake, lying in bed, very sleepy but able to answer yes no questions HENT: [place]/AT, PERRLA, no scleral icterus Mouth/Throat: Oropharynx is clear and dry. No oropharyngeal exudate.  Cardiovascular: Normal rate, regular rhythm  distant Pulmonary/Chest: poor effort, bil rhonchi Neck = supple, no nuchal rigidity Abdominal: Soft. obese Bowel sounds are normal.  exhibits no distension. There is no tenderness.  Lymphadenopathy: no cervical adenopathy. No axillary adenopathy Neurological: sleepy appearing but able to interact  Skin: Skin is warm and dry. No rash noted. No erythema.  Psychiatric:flat affect Lab Results  Recent Labs  01/27/17 0417 01/28/17 0843 01/29/17 0453  WBC 8.7  --  8.9  HGB 10.9*  --  12.0  HCT 33.6*  --  36.2  NA 137 134* 137  K 3.2* 3.4* 3.9  CL 99* 94* 98*  CO2 30 30 31   BUN 16 12 12   CREATININE 0.58 0.78 0.61   Microbiology: Results for orders placed or performed during the hospital encounter of 01/15/17  Blood culture (routine x 2)     Status: None   Collection Time: 01/15/17  4:04 PM  Result Value Ref Range Status   Specimen Description BLOOD BLOOD LEFT ARM  Final   Special Requests   Final    BOTTLES DRAWN AEROBIC AND ANAEROBIC Blood Culture adequate volume   Culture NO GROWTH 5 DAYS  Final   Report Status 01/20/2017 FINAL  Final  Blood culture (routine x 2)     Status: None  Collection Time: 01/15/17  4:05 PM  Result Value Ref Range Status   Specimen Description BLOOD LEFT ANTECUBITAL  Final   Special Requests   Final    BOTTLES DRAWN AEROBIC AND ANAEROBIC Blood Culture adequate volume   Culture NO GROWTH 5 DAYS  Final   Report Status 01/20/2017 FINAL  Final  Urine Culture     Status: Abnormal   Collection Time: 01/15/17  8:51 PM  Result Value Ref Range Status   Specimen Description URINE, CATHETERIZED  Final   Special Requests NONE  Final   Culture (A)  Final    >=100,000 COLONIES/mL CANDIDA PARAPSILOSIS SEE SEPARATE REPORT FOR SUSCEPTIBILITIES Performed at [facility], [address] [place], [place] [address]    Report Status 01/28/2017 FINAL  Final  Body fluid culture     Status: None   Collection Time: 01/18/17  9:10 AM  Result Value Ref Range  Status   Specimen Description PLEURAL  Final   Special Requests NONE  Final   Gram Stain   Final    ABUNDANT WBC PRESENT,BOTH PMN AND MONONUCLEAR NO ORGANISMS SEEN    Culture   Final    NO GROWTH 3 DAYS Performed at [facility], [address] [place], [place] [address]    Report Status 01/22/2017 FINAL  Final  MRSA PCR Screening     Status: None   Collection Time: 01/18/17  2:15 PM  Result Value Ref Range Status   MRSA by PCR NEGATIVE NEGATIVE Final    Comment:        The GeneXpert MRSA Assay (FDA approved for NASAL specimens only), is one component of a comprehensive MRSA colonization surveillance program. It is not intended to diagnose MRSA infection nor to guide or monitor treatment for MRSA infections.   Urine Culture     Status: Abnormal   Collection Time: 01/20/17 10:52 PM  Result Value Ref Range Status   Specimen Description URINE, RANDOM  Final   Special Requests Normal  Final   Culture 30,000 COLONIES/mL YEAST (A)  Final   Report Status 01/22/2017 FINAL  Final    Studies/Results: No results found.  Assessment/Plan: Duard Bradyhyllis W Steadman is a 71 y.o. female admitted with AMS and found to have pericardial and pleural effusion as well as PNA.  She also has had recurrent UTIs with yeast for about 6 months. Candiduria is unusual in patients without indwelling urinary caths however she has had multiple positive cultures with pyuria noted on UA.  CT did reveal stones and urology had foley placed for possible neurogenic bladder Cx reveals candida parapsilosis. Sensitivities reviewed and reveal resistant to fluconazole but sensitive to amphotericin. Intermediate to voriconazole. Echinocandins do not treat UTIs.  Recommendations Start amphotericin. Will plan 5-7 day course Continue management of stones and neurogenic bladder by urology. I think it would be best if we could clear the tiny stones as they will continue to serve as a nidus of infection for a resistant  organism.  I have communicated with Dr Apolinar JunesBrandon.   Thank you very much for the consult. Will follow with you.  Mick SellDavid P Fitzgerald   01/29/2017, 2:45 PM

## 2017-01-29 NOTE — Progress Notes (Addendum)
Pt is more confused tonight. Jessica BaronBegun calling out her sons name and screaming at her husband. Pt is not making sense saying "Hes not my husband" and "hes the crazy one" and "I want to get in the other bed". Pt Is only oriented to self at this time. Pt is cursing and telling RN to get out. MD made aware. Pt has not been able to sleep tonight.

## 2017-01-29 NOTE — Progress Notes (Signed)
[employer] [facility] [place] [facility]   [name]    MR#:  [id number]  DATE OF BIRTH:  [birth year]  SUBJECTIVE:   More alert this am Son at bedside As per son, did not sleep last night. Had some confusion also last night and again today afternoon.  REVIEW OF SYSTEMS:    Review of Systems  Constitutional: Negative for fever, chills weight loss ++malaise HENT: Negative for ear pain, nosebleeds, congestion, facial swelling, rhinorrhea, neck pain, neck stiffness and ear discharge.   Respiratory: Negative for cough, shortness of breath, wheezing  Cardiovascular: Negative for chest pain, palpitations and leg swelling.  Gastrointestinal: Negative for heartburn, abdominal pain, vomiting, diarrhea or consitpation Genitourinary: Negative for dysuria, urgency, frequency, hematuria Musculoskeletal: Negative for back pain or joint pain Neurological: Negative for dizziness, seizures, syncope, focal weakness,  numbness and headaches.  bedbound Hematological: Does not bruise/bleed easily.  Psychiatric/Behavioral:Positive for depression   Tolerating Diet: yes      DRUG ALLERGIES:   Allergies  Allergen Reactions  . Ambien [Zolpidem Tartrate] Other (See Comments)    Reaction: altered mental status  . Amoxicillin Other (See Comments)    Reaction: blisters in mouth Has patient had a PCN reaction causing immediate rash, facial/tongue/throat swelling, SOB or lightheadedness with hypotension: Yes Has patient had a PCN reaction causing severe rash involving mucus membranes or skin necrosis: No Has patient had a PCN reaction that required hospitalization No Has patient had a PCN reaction occurring within the last 10 years: Yes If all of the above answers are "NO", then may proceed with Cephalosporin use.     VITALS:  Blood pressure (!) 117/54, pulse 86, temperature 98.1 F (36.7 C), temperature source Oral, resp. rate 18, height 5\' 10"  (1.778 m), weight 93  kg (205 lb), SpO2 90 %.  PHYSICAL EXAMINATION:  Constitutional: Appears obese No distress. HENT: Normocephalic. Marland Kitchen Oropharynx is clear and moist.  Eyes: Conjunctivae and EOM are normal. PERRLA, no scleral icterus.  Neck: Normal ROM. Neck supple. No JVD. No tracheal deviation. CVS: RRR, S1/S2 +, no murmurs,  Pulmonary: Effort and breath sounds normal, no stridor, rhonchi, wheezes, rales.  Abdominal: Soft. BS +,  no distension, tenderness, rebound or guarding.  Musculoskeletal: Normal range of motion. ++2 +LEE no tenderness.  Neuro: Alert. CN 2-12 grossly intact. No focal deficits. Involuntary - jerky movements on both upper limbs and hands. Skin: Skin is warm and dry. No rash noted. Sacral stage 1 Psychiatric: flat depressed affect. Some agitation and seeing things which are not there.   LABORATORY PANEL:   CBC  Recent Labs Lab 01/29/17 0453  WBC 8.9  HGB 12.0  HCT 36.2  PLT 320   ------------------------------------------------------------------------------------------------------------------  Chemistries   Recent Labs Lab 01/23/17 0351 01/23/17 0359  01/29/17 0453  NA  --  135  < > 137  K  --  2.7*  < > 3.9  CL  --  101  < > 98*  CO2  --  28  < > 31  GLUCOSE  --  108*  < > 88  BUN  --  20  < > 12  CREATININE  --  0.90  < > 0.61  CALCIUM  --  8.5*  < > 8.6*  MG 1.7  --   --   --   AST  --  19  --   --   ALT  --  10*  --   --   ALKPHOS  --  101  --   --   BILITOT  --  0.7  --   --   < > = values in this interval not displayed. ------------------------------------------------------------------------------------------------------------------  Cardiac Enzymes No results for input(s): TROPONINI in the last 168 hours. ------------------------------------------------------------------------------------------------------------------  RADIOLOGY:  No results found.  ASSESSMENT AND PLAN:   71 year old female with a history of depression and chronically bed bound due  to spinal stenosis who presented October 5 due to altered mental status.  1.Acute metabolic encephalopathy in the setting of pneumonia and severe depression Patient seen and evaluated by neurology. CT head is negative Encephalopathy slowly improving All sedative medications on hold due to encephalopathy.  MRI brain was negative for acute CVA   As per son, pt was on trazodone before admit. She is not having good sleep now, so he requested to restart it.   Due to agitation started haldol injection PRN, spoke to son in room.  2. HCAP with pleural effusion status post thoracentesis Patient has completed treatment with vancomycin and cefepime ID consultation is appreciated.  3. Bilateral hydronephrosis: As per urology patient,this is probably a chronic issue. Continue Foley catheter at this time Repeat ultrasound shows mild hydronephrosis on the right and resolved on the left Will need foley until urology follow up in 2-3 weeks after discharge.  4.recurrent urinary tract infections: Urine culture positive for Candida sent to lab cor for sensitivities. Continue fluconazole for now  Follow up with ID cx resulted back, ID suggest to give amphoterecin B and pharmacy to manage labs.  ID had discussed this with her son and husband in room.  5. Severe depression: Patient is being followed by psychiatry Due to severe lethargy All sedative medications including those for depression have been discontinued. Family does not want psychiatrist to see patient any more. I have asked them again, as pt is more agitated, but they refused, so will manage currently with haldol.   6. Hypokalemia: Repleted    Again was low, pt was on daily Potassium at home, resume KCl daily.  7. Pericardial effusion: Patient has been seen and evaluated by cardiology.Echocardiogram shows moderate  pericardial effusion. Patient has no signs of tamponade Patient will need outpatient follow up with Dr Welton FlakesKhan to Reassess  pericardial effusion. At present this is asymptomatic.   Cardiology is following daily inpatient.  8. Essential hypertension: Continue Norvasc   Added metoprolol due to tachycardia.  9. Hyperlipidemia: Continue statin  10. Sacral wound, Present on admission: Cleanse sacral and buttocks area with soap and water.  Apply Gerhardt's butt cream twice daily.  No briefs or disposable underpads while in bed.   Low air loss bed.   11. NSVT: heart rate controlled currently Cardiology following- added metoprolol.  12.  Renal Mass: Will need outpatient UROLOGY follow up.   CSW mentioned LTAC, however patient's family now very upset that this was mentioned   Management plans discussed with the patient's son and he is in agreement. Waiting for Ur cx result and Guide by ID for discharge.   CODE STATUS: FULL  TOTAL TIME TAKING CARE OF THIS PATIENT: 25 minutes.   POSSIBLE D/C 2 days home with HHC , DEPENDING ON CLINICAL CONDITION.   Altamese DillingVACHHANI, Jex Strausbaugh M.D on 01/26/2017 at 4:53 PM  Between 7am to 6pm - Pager - [phone] After 6pm go to [email or web address] - Social research officer, governmentpassword [email or web address]  [facility]  Office  [phone]  CC: Primary care physician; Center, [facility]  Note: This dictation was prepared with  Dragon dictation along with smaller Company secretary. Any transcriptional errors that result from this process are unintentional.

## 2017-01-29 NOTE — Progress Notes (Addendum)
ANTIBIOTIC CONSULT NOTE - INITIAL  Pharmacy Consult for amphotericin  Indication: candida in urine   Allergies  Allergen Reactions  . Ambien [Zolpidem Tartrate] Other (See Comments)    Reaction: altered mental status  . Amoxicillin Other (See Comments)    Reaction: blisters in mouth Has patient had a PCN reaction causing immediate rash, facial/tongue/throat swelling, SOB or lightheadedness with hypotension: Yes Has patient had a PCN reaction causing severe rash involving mucus membranes or skin necrosis: No Has patient had a PCN reaction that required hospitalization No Has patient had a PCN reaction occurring within the last 10 years: Yes If all of the above answers are "NO", then may proceed with Cephalosporin use.     Patient Measurements: Height: 5\' 10"  (177.8 cm) Weight: 205 lb (93 kg) IBW/kg (Calculated) : 68.5 Adjusted Body Weight:   Vital Signs: Temp: 98.1 F (36.7 C) (10/19 0413) Temp Source: Oral (10/19 0413) BP: 125/67 (10/19 0413) Pulse Rate: 104 (10/19 0413) Intake/Output from previous day: No intake/output data recorded. Intake/Output from this shift: Total I/O In: 200 [P.O.:200] Out: -   Labs:  Recent Labs  01/27/17 0417 01/28/17 0843 01/29/17 0453  WBC 8.7  --  8.9  HGB 10.9*  --  12.0  PLT 292  --  320  CREATININE 0.58 0.78 0.61   Estimated Creatinine Clearance: 82 mL/min (by C-G formula based on SCr of 0.61 mg/dL). No results for input(s): VANCOTROUGH, VANCOPEAK, VANCORANDOM, GENTTROUGH, GENTPEAK, GENTRANDOM, TOBRATROUGH, TOBRAPEAK, TOBRARND, AMIKACINPEAK, AMIKACINTROU, AMIKACIN in the last 72 hours.   Microbiology: Recent Results (from the past 720 hour(s))  Urine culture     Status: Abnormal   Collection Time: 01/01/17  3:27 PM  Result Value Ref Range Status   Specimen Description URINE, CATHETERIZED  Final   Special Requests NONE  Final   Culture 70,000 COLONIES/mL YEAST (A)  Final   Report Status 01/03/2017 FINAL  Final  Culture,  blood (routine x 2)     Status: None   Collection Time: 01/01/17  3:32 PM  Result Value Ref Range Status   Specimen Description BLOOD Blood Culture adequate volume  Final   Special Requests BLOOD RIGHT HAND  Final   Culture NO GROWTH 5 DAYS  Final   Report Status 01/06/2017 FINAL  Final  Culture, blood (routine x 2)     Status: None   Collection Time: 01/01/17  3:32 PM  Result Value Ref Range Status   Specimen Description BLOOD Blood Culture adequate volume  Final   Special Requests BLOOD LEFT WRIST  Final   Culture NO GROWTH 5 DAYS  Final   Report Status 01/06/2017 FINAL  Final  MRSA PCR Screening     Status: None   Collection Time: 01/01/17  8:39 PM  Result Value Ref Range Status   MRSA by PCR NEGATIVE NEGATIVE Final    Comment:        The GeneXpert MRSA Assay (FDA approved for NASAL specimens only), is one component of a comprehensive MRSA colonization surveillance program. It is not intended to diagnose MRSA infection nor to guide or monitor treatment for MRSA infections.   Blood culture (routine x 2)     Status: None   Collection Time: 01/15/17  4:04 PM  Result Value Ref Range Status   Specimen Description BLOOD BLOOD LEFT ARM  Final   Special Requests   Final    BOTTLES DRAWN AEROBIC AND ANAEROBIC Blood Culture adequate volume   Culture NO GROWTH 5 DAYS  Final  Report Status 01/20/2017 FINAL  Final  Blood culture (routine x 2)     Status: None   Collection Time: 01/15/17  4:05 PM  Result Value Ref Range Status   Specimen Description BLOOD LEFT ANTECUBITAL  Final   Special Requests   Final    BOTTLES DRAWN AEROBIC AND ANAEROBIC Blood Culture adequate volume   Culture NO GROWTH 5 DAYS  Final   Report Status 01/20/2017 FINAL  Final  Urine Culture     Status: Abnormal   Collection Time: 01/15/17  8:51 PM  Result Value Ref Range Status   Specimen Description URINE, CATHETERIZED  Final   Special Requests NONE  Final   Culture (A)  Final    >=100,000 COLONIES/mL  CANDIDA PARAPSILOSIS SEE SEPARATE REPORT FOR SUSCEPTIBILITIES Performed at [facility], [address] [place], [place] [address]    Report Status 01/28/2017 FINAL  Final  Body fluid culture     Status: None   Collection Time: 01/18/17  9:10 AM  Result Value Ref Range Status   Specimen Description PLEURAL  Final   Special Requests NONE  Final   Gram Stain   Final    ABUNDANT WBC PRESENT,BOTH PMN AND MONONUCLEAR NO ORGANISMS SEEN    Culture   Final    NO GROWTH 3 DAYS Performed at [facility], [address], [place], [place] [address]    Report Status 01/22/2017 FINAL  Final  MRSA PCR Screening     Status: None   Collection Time: 01/18/17  2:15 PM  Result Value Ref Range Status   MRSA by PCR NEGATIVE NEGATIVE Final    Comment:        The GeneXpert MRSA Assay (FDA approved for NASAL specimens only), is one component of a comprehensive MRSA colonization surveillance program. It is not intended to diagnose MRSA infection nor to guide or monitor treatment for MRSA infections.   Urine Culture     Status: Abnormal   Collection Time: 01/20/17 10:52 PM  Result Value Ref Range Status   Specimen Description URINE, RANDOM  Final   Special Requests Normal  Final   Culture 30,000 COLONIES/mL YEAST (A)  Final   Report Status 01/22/2017 FINAL  Final    Medical History: Past Medical History:  Diagnosis Date  . Chronic pain   . Depression   . HLD (hyperlipidemia)   . HTN (hypertension)   . Hypothyroidism   . Morbid obesity (HCC)   . Recurrent UTI   . Spinal stenosis   . Visual hallucinations     Medications:  Prescriptions Prior to Admission  Medication Sig Dispense Refill Last Dose  . acetaminophen (TYLENOL) 325 MG tablet Take 650 mg by mouth every 6 (six) hours as needed for mild pain.   PRN at PRN  . albuterol (PROVENTIL HFA;VENTOLIN HFA) 108 (90 Base) MCG/ACT inhaler Inhale 2 puffs into the lungs every 4 (four) hours as needed for wheezing or shortness  of breath.   PRN at PRN  . albuterol (PROVENTIL) (2.5 MG/3ML) 0.083% nebulizer solution Take 2.5 mg by nebulization every 6 (six) hours as needed for wheezing or shortness of breath.   PRN at PRN  . amlodipine-atorvastatin (CADUET) 2.5-10 MG tablet Take 1 tablet by mouth daily.   01/01/2017 at am  . ARIPiprazole (ABILIFY) 2 MG tablet Take 2 mg by mouth daily.  1 01/01/2017 at am  . aspirin EC 81 MG tablet Take 81 mg by mouth daily.   01/01/2017 at  0730  . baclofen (LIORESAL) 20 MG tablet Take 5-20 mg by mouth 3 (three) times daily. 10 mg every morning and 20 mg every evening   01/01/2017 at pm  . docusate sodium (COLACE) 100 MG capsule Take 100 mg by mouth 2 (two) times daily.   01/01/2017 at am  . fluconazole (DIFLUCAN) 100 MG tablet Take 100 mg by mouth once a week.     . fluticasone (FLONASE) 50 MCG/ACT nasal spray Place 2 sprays into both nostrils daily as needed. For congestion   PRN at PRN  . furosemide (LASIX) 20 MG tablet Take 20 mg by mouth daily.   01/01/2017 at am  . levothyroxine (SYNTHROID, LEVOTHROID) 150 MCG tablet Take 150 mcg by mouth daily before breakfast.   01/01/2017 at am  . mupirocin ointment (BACTROBAN) 2 % Place 1 application into the nose 2 (two) times daily. Apply to open skin on toes   01/01/2017 at am  . nystatin cream (MYCOSTATIN) Apply 1 application topically 2 (two) times daily. Apply to rash in skin folds   01/01/2017 at am  . polyethylene glycol (MIRALAX / GLYCOLAX) packet Take 17 g by mouth daily.   01/01/2017 at am  . potassium chloride SA (K-DUR,KLOR-CON) 20 MEQ tablet Take 30 mEq by mouth daily.    01/01/2017 at am  . pregabalin (LYRICA) 150 MG capsule Take 150 mg by mouth 3 (three) times daily.   01/01/2017 at pm  . senna-docusate (SENOKOT-S) 8.6-50 MG tablet Take 2 tablets by mouth daily.   01/01/2017 at am  . Simethicone 180 MG CAPS Take 2 capsules by mouth 3 (three) times daily.   01/01/2017 at pm  . traZODone (DESYREL) 100 MG tablet Take 50 mg by mouth at bedtime.     12/31/2016 at pm  . triamcinolone ointment (KENALOG) 0.1 % Apply 1 application topically 2 (two) times daily.   01/01/2017 at am  . Venlafaxine HCl 225 MG TB24 Take 225 mg by mouth daily.   01/01/2017 at am  . vitamin B-12 (CYANOCOBALAMIN) 1000 MCG tablet Take 1,000 mcg by mouth daily.   01/01/2017 at am  . Vitamin D, Ergocalciferol, (DRISDOL) 50000 units CAPS capsule Take 50,000 Units by mouth every 30 (thirty) days.   Past Month at Unknown time  . Zinc Oxide 13 % CREA Apply 1 application topically as needed for irritation.   PRN at PRN  . hydrocortisone (ANUSOL-HC) 2.5 % rectal cream Place 1 application rectally at bedtime as needed for hemorrhoids or itching.   PRN at PRN  . penciclovir (DENAVIR) 1 % cream Apply 1 application topically 5 (five) times daily as needed. Apply to lip for lesions   PRN at PRN   Scheduled:  . amLODipine  2.5 mg Oral Daily   And  . atorvastatin  10 mg Oral Daily  . aspirin EC  81 mg Oral Daily  . docusate sodium  100 mg Oral BID  . enoxaparin (LOVENOX) injection  40 mg Subcutaneous Q24H  . feeding supplement (ENSURE ENLIVE)  237 mL Oral BID BM  . furosemide  40 mg Oral Daily  . Gerhardt's butt cream   Topical BID  . haloperidol lactate  2 mg Intravenous Once  . levothyroxine  150 mcg Oral QAC breakfast  . metoprolol tartrate  25 mg Oral BID  . mupirocin ointment  1 application Nasal BID  . nystatin cream  1 application Topical BID  . polyethylene glycol  17 g Oral Daily  . potassium chloride  30  mEq Oral Daily  . pregabalin  150 mg Oral TID  . senna-docusate  2 tablet Oral Daily  . traZODone  50 mg Oral QHS  . venlafaxine XR  150 mg Oral Daily  . vitamin B-12  1,000 mcg Oral Daily  . 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 Patient previously on fluconazole. ID following.   Goal of Therapy:    Plan:   Will start liposomal amphotericin b @ 400 mg (4.2 mg/kg) q24 hours. Pharmacy to follow.    Romani Wilbon D 01/29/2017,3:14 PM

## 2017-01-30 LAB — BASIC METABOLIC PANEL
Anion gap: 10 (ref 5–15)
BUN: 12 mg/dL (ref 6–20)
CO2: 29 mmol/L (ref 22–32)
Calcium: 8.5 mg/dL — ABNORMAL LOW (ref 8.9–10.3)
Chloride: 96 mmol/L — ABNORMAL LOW (ref 101–111)
Creatinine, Ser: 0.77 mg/dL (ref 0.44–1.00)
GFR calc Af Amer: >60 mL/min (ref >60)
GFR calc non Af Amer: >60 mL/min (ref >60)
Glucose, Bld: 85 mg/dL (ref 65–99)
Potassium: 4.2 mmol/L (ref 3.5–5.1)
Sodium: 135 mmol/L (ref 135–145)

## 2017-01-30 LAB — MAGNESIUM: Magnesium: 1.6 mg/dL — ABNORMAL LOW (ref 1.7–2.4)

## 2017-01-30 MED ORDER — SODIUM CHLORIDE 0.9 % IV SOLN
INTRAVENOUS | Status: AC
  Administered 2017-01-30: 18:00:00 via INTRAVENOUS

## 2017-01-30 MED ORDER — MAGNESIUM SULFATE 2 GM/50ML IV SOLN
2.0000 g | Freq: Once | INTRAVENOUS | Status: AC
  Administered 2017-01-30: 2 g via INTRAVENOUS
  Filled 2017-01-30: qty 50

## 2017-01-30 MED ORDER — AMPHOTERICIN B LIPID 5 MG/ML IV SUSP
400.0000 mg | INTRAVENOUS | Status: DC
  Administered 2017-01-30 – 2017-01-31 (×2): 400 mg via INTRAVENOUS
  Filled 2017-01-30 (×2): qty 80

## 2017-01-30 NOTE — Progress Notes (Signed)
[employer] [facility] - [place] at [facility]   [name]    MR#:  [id number]  DATE OF BIRTH:  1945/06/17  SUBJECTIVE:   Her husband and son at bedside As per her husband, the patient is still confused and had some agitation. Per RN, the patient has no agitation, does not need Haldol this time.  REVIEW OF SYSTEMS:    Review of Systems  He patient is confused, unable to get ROS.  DRUG ALLERGIES:   Allergies  Allergen Reactions  . Ambien [Zolpidem Tartrate] Other (See Comments)    Reaction: altered mental status  . Amoxicillin Other (See Comments)    Reaction: blisters in mouth Has patient had a PCN reaction causing immediate rash, facial/tongue/throat swelling, SOB or lightheadedness with hypotension: Yes Has patient had a PCN reaction causing severe rash involving mucus membranes or skin necrosis: No Has patient had a PCN reaction that required hospitalization No Has patient had a PCN reaction occurring within the last 10 years: Yes If all of the above answers are "NO", then may proceed with Cephalosporin use.     VITALS:  Blood pressure 116/69, pulse (!) 119, temperature 99.1 F (37.3 C), temperature source Oral, resp. rate 17, height 5\' 10"  (1.778 m), weight 205 lb (93 kg), SpO2 95 %.  PHYSICAL EXAMINATION:  Constitutional: Appears obese No distress. Morbid obesity. HENT: Normocephalic.   Eyes: Conjunctivae and EOM are normal. PERRLA, no scleral icterus.  Neck: Normal ROM. Neck supple. No JVD. No tracheal deviation. CVS: RRR, S1/S2 +, no murmurs,  Pulmonary: Effort and breath sounds normal, no stridor, rhonchi, wheezes, rales.  Abdominal: Soft. BS +,  no distension, tenderness, rebound or guarding.  Musculoskeletal: Normal range of motion. ++2 +LEE no tenderness.  Neuro: the patient is awake but confused, mild agitation during physical exam, unable to exam. Skin: Skin is warm and dry. No rash noted. Sacral stage 1 Psychiatric: flat  depressed affect. Some agitation.  LABORATORY PANEL:   CBC  Recent Labs Lab 01/29/17 0453  WBC 8.9  HGB 12.0  HCT 36.2  PLT 320   ------------------------------------------------------------------------------------------------------------------  Chemistries   Recent Labs Lab 01/30/17 0358  NA 135  K 4.2  CL 96*  CO2 29  GLUCOSE 85  BUN 12  CREATININE 0.77  CALCIUM 8.5*  MG 1.6*   ------------------------------------------------------------------------------------------------------------------  Cardiac Enzymes No results for input(s): TROPONINI in the last 168 hours. ------------------------------------------------------------------------------------------------------------------  RADIOLOGY:  No results found.  ASSESSMENT AND PLAN:   71 year old female with a history of depression and chronically bed bound due to spinal stenosis who presented October 5 due to altered mental status.  1.Acute metabolic encephalopathy in the setting of pneumonia and severe depression Patient seen and evaluated by neurology. CT head is negative Encephalopathy slowly improving All sedative medications on hold due to encephalopathy.  MRI brain was negative for acute CVA   As per son, pt was on trazodone before admit. She is not having good sleep now, so he requested to restart it.   Due to agitation started haldol injection PRN, spoke to son in room.  2. HCAP with pleural effusion status post thoracentesis Patient has completed treatment with vancomycin and cefepime ID consultation is appreciated.  3. Bilateral hydronephrosis: As per urology patient,this is probably a chronic issue. Continue Foley catheter at this time Repeat ultrasound shows mild hydronephrosis on the right and resolved on the left Will need foley until urology follow up in 2-3 weeks after discharge.  4.recurrent urinary tract infections:  Urine culture positive for Candida sent to lab cor for  sensitivities. Continue fluconazole for now  Follow up with ID cx resulted back, ID suggest to give amphoterecin B and pharmacy to manage labs.  ID had discussed this with her son and husband in room. Follow-up BMP.  5. Severe depression: Patient is being followed by psychiatry Due to severe lethargy All sedative medications including those for depression have been discontinued. Family does not want psychiatrist to see patient any more. Dr. Elisabeth Pigeon asked them again, as pt is more agitated, but they refused, so will manage currently with haldol.   6. Hypokalemia:    Again was low, pt was on daily Potassium at home, resumed KCl daily. Improved.  Hypomagnesemia. Give IV magnesium (upon her son's permission) and follow-up level.  7. Pericardial effusion: Patient has been seen and evaluated by cardiology.Echocardiogram shows moderate  pericardial effusion. Patient has no signs of tamponade Patient will need outpatient follow up with Dr Welton Flakes to Reassess pericardial effusion. At present this is asymptomatic. Stable per Dr. Welton Flakes.  8. Essential hypertension: Continue Norvasc   Added metoprolol due to tachycardia.  9. Hyperlipidemia: Continue statin  10. Sacral wound, Present on admission: Cleanse sacral and buttocks area with soap and water.  Apply Gerhardt's butt cream twice daily.  No briefs or disposable underpads while in bed.   Low air loss bed.   11. NSVT: heart rate controlled currently Cardiology following- added metoprolol.  12.  Renal Mass: Will need outpatient UROLOGY follow up.   CSW mentioned LTAC, however patient's family now very upset that this was mentioned   Management plans discussed with the patient's son, husband and he is in agreement. CODE STATUS: FULL  TOTAL TIME TAKING CARE OF THIS PATIENT: 35 minutes.   POSSIBLE D/C 3 days home with HHC , DEPENDING ON CLINICAL CONDITION.   Shaune Pollack M.D on 01/26/2017 at 4:25 PM  Between 7am to 6pm - Pager -  [phone] After 6pm go to [email or web address] - Social research officer, government  [facility]  Office  [phone]  CC: Primary care physician; Center, [facility]  Note: This dictation was prepared with Nurse, children's dictation along with smaller phrase technology. Any transcriptional errors that result from this process are unintentional.

## 2017-01-30 NOTE — Progress Notes (Signed)
CH responded to call from nurse to consult with patient's son as he was having suicidal thoughts and expressing them. CH sat with patient's son as he cried and talked about having to have his dog put to sleep. He did not want to leave the room and made several comments about wanting the pain to end and he can't take it anymore and wishes to end it. Patient's spouse was in the room trying to calm his son as well. Patient's son went to sleep in chair. CH talked with patient's spouse and offered prayer and prayer shawl. CH will follow-up as needed.     01/30/17 1725  Clinical Encounter Type  Visited With Family;Patient and family together;Health care provider  Visit Type Follow-up;Spiritual support  Referral From Patient;Family;Nurse  Spiritual Encounters  Spiritual Needs Emotional;Grief support;Other (Comment)

## 2017-01-30 NOTE — Progress Notes (Signed)
Patient was able to blow into spirometer 2 times

## 2017-01-30 NOTE — Progress Notes (Signed)
SUBJECTIVE: Patient is less short of breath   Vitals:   01/29/17 1500 01/29/17 2040 01/30/17 0303 01/30/17 0830  BP: (!) 117/54 95/60 117/61 109/60  Pulse: 86 (!) 109 (!) 107 (!) 114  Resp: 18 16    Temp:  98.1 F (36.7 C) 98.3 F (36.8 C) 99.1 F (37.3 C)  TempSrc:  Oral Oral Oral  SpO2: 90% 90% 90% 91%  Weight:      Height:        Intake/Output Summary (Last 24 hours) at 01/30/17 1059 Last data filed at 01/29/17 1736  Gross per 24 hour  Intake              100 ml  Output              425 ml  Net             -325 ml    LABS: Basic Metabolic Panel:  Recent Labs  16/01/9609/19/18 0453 01/30/17 0358  NA 137 135  K 3.9 4.2  CL 98* 96*  CO2 31 29  GLUCOSE 88 85  BUN 12 12  CREATININE 0.61 0.77  CALCIUM 8.6* 8.5*  MG  --  1.6*   Liver Function Tests: No results for input(s): AST, ALT, ALKPHOS, BILITOT, PROT, ALBUMIN in the last 72 hours. No results for input(s): LIPASE, AMYLASE in the last 72 hours. CBC:  Recent Labs  01/29/17 0453  WBC 8.9  HGB 12.0  HCT 36.2  MCV 80.4  PLT 320   Cardiac Enzymes: No results for input(s): CKTOTAL, CKMB, CKMBINDEX, TROPONINI in the last 72 hours. BNP: Invalid input(s): POCBNP D-Dimer: No results for input(s): DDIMER in the last 72 hours. Hemoglobin A1C: No results for input(s): HGBA1C in the last 72 hours. Fasting Lipid Panel: No results for input(s): CHOL, HDL, LDLCALC, TRIG, CHOLHDL, LDLDIRECT in the last 72 hours. Thyroid Function Tests: No results for input(s): TSH, T4TOTAL, T3FREE, THYROIDAB in the last 72 hours.  Invalid input(s): FREET3 Anemia Panel: No results for input(s): VITAMINB12, FOLATE, FERRITIN, TIBC, IRON, RETICCTPCT in the last 72 hours.   PHYSICAL EXAM General: Well developed, well nourished, in no acute distress HEENT:  Normocephalic and atramatic Neck:  No JVD.  Lungs: Clear bilaterally to auscultation and percussion. Heart: HRRR . Normal S1 and S2 without gallops or murmurs.  Abdomen: Bowel  sounds are positive, abdomen soft and non-tender  Msk:  Back normal, normal gait. Normal strength and tone for age. Extremities: No clubbing, cyanosis or edema.   Neuro: Alert and oriented X 3. 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 Clinically stable.  Principal Problem:   Severe recurrent major depression with psychotic features (HCC) Active Problems:   Pneumonia   Subacute delirium   Altered mental status    Breana Litts A, MD, [facility] 01/30/2017 10:59 AM

## 2017-01-30 NOTE — Progress Notes (Signed)
[facility] responded to consult request to speak with family. CH met with patient's son and walked with him to cafeteria to get a snack and talk. Patient's son was upset about having his dog put to sleep this morning and has not slept or eaten well in about a week. Patient's son is upset over the care his mother is receiving and would like to speak to someone about how he feels..  He stated he doesn't trust the staff.. Patient was in room and is awaiting new antibiotics to start. [facility] will follow-up at a later time..    01/30/17 0932  Clinical Encounter Type  Visited With Family;Patient not available;Health care provider  Visit Type Initial;Spiritual support  Referral From Nurse  Spiritual Encounters  Spiritual Needs Emotional;Grief support

## 2017-01-30 NOTE — Clinical Social Work Note (Signed)
CSW met with the patient's son, briefly, at his request to provide emotional support. CSW will continue to be available over the weekend should the patient or her family need additional support.  Santiago Bumpers, MSW, Latanya Presser [phone]

## 2017-01-30 NOTE — Progress Notes (Signed)
Pt more alert this afternoon than this AM, answers questions appropriately, continues with flat affect, does still have incomprehensible speech at times. Son and husband at bedside. Pt refusing all meds and meals, attempted to feed applesauce but pt refused. Low urine output this shift. MD notified. IV fluids ordered. Urine is very concentrated, amber in color.

## 2017-01-30 NOTE — Progress Notes (Signed)
Called pharmacy to check on the amphotericin status. We do not have this medication in the [facility].

## 2017-01-31 LAB — BASIC METABOLIC PANEL
Anion gap: 9 (ref 5–15)
BUN: 16 mg/dL (ref 6–20)
CO2: 28 mmol/L (ref 22–32)
Calcium: 8.3 mg/dL — ABNORMAL LOW (ref 8.9–10.3)
Chloride: 99 mmol/L — ABNORMAL LOW (ref 101–111)
Creatinine, Ser: 1.18 mg/dL — ABNORMAL HIGH (ref 0.44–1.00)
GFR calc Af Amer: 53 mL/min — ABNORMAL LOW (ref >60)
GFR calc non Af Amer: 46 mL/min — ABNORMAL LOW (ref >60)
Glucose, Bld: 84 mg/dL (ref 65–99)
Potassium: 4.1 mmol/L (ref 3.5–5.1)
Sodium: 136 mmol/L (ref 135–145)

## 2017-01-31 LAB — MAGNESIUM: Magnesium: 2.2 mg/dL (ref 1.7–2.4)

## 2017-01-31 MED ORDER — DEXTROSE 5 % IV SOLN
Freq: Every day | INTRAVENOUS | Status: DC
  Administered 2017-01-31 – 2017-02-01 (×2): via INTRAVENOUS

## 2017-01-31 MED ORDER — METOPROLOL TARTRATE 5 MG/5ML IV SOLN: 2.5000 mg | INTRAVENOUS | Status: DC | PRN

## 2017-01-31 MED ORDER — DEXTROSE 5 % IV SOLN
400.0000 mg | INTRAVENOUS | Status: DC
  Administered 2017-02-01: 400 mg via INTRAVENOUS
  Filled 2017-01-31 (×2): qty 80

## 2017-01-31 NOTE — Progress Notes (Signed)
[name] Tomma Lightning(Frankie) has been at bedside for most of the day. He is tearful and upset about his dog being put to sleep. Pt making statements about wanting to "end his pain". Chaplain was called (see note). Emotional support given to family and patient. Nursing supervisor notified. Will continue to monitor.

## 2017-01-31 NOTE — Progress Notes (Signed)
[employer] - [place] at [facility]   [name]    MR#:  [id number]  DATE OF BIRTH:  11/19/1945  SUBJECTIVE:   The patient is confused, unable to answer questions. No agitation per RN. She has poor oral intake, better tachycardia and urine output on normal saline IV. REVIEW OF SYSTEMS:    Review of Systems  He patient is confused, unable to get ROS.  DRUG ALLERGIES:   Allergies  Allergen Reactions  . Ambien [Zolpidem Tartrate] Other (See Comments)    Reaction: altered mental status  . Amoxicillin Other (See Comments)    Reaction: blisters in mouth Has patient had a PCN reaction causing immediate rash, facial/tongue/throat swelling, SOB or lightheadedness with hypotension: Yes Has patient had a PCN reaction causing severe rash involving mucus membranes or skin necrosis: No Has patient had a PCN reaction that required hospitalization No Has patient had a PCN reaction occurring within the last 10 years: Yes If all of the above answers are "NO", then may proceed with Cephalosporin use.     VITALS:  Blood pressure (!) 100/58, pulse 94, temperature 97.9 F (36.6 C), temperature source Oral, resp. rate 16, height 5\' 10"  (1.778 m), weight 205 lb (93 kg), SpO2 99 %.  PHYSICAL EXAMINATION:  Constitutional: Appears obese No distress. Morbid obesity. HENT: Normocephalic.   Eyes: Conjunctivae and EOM are normal. PERRLA, no scleral icterus.  Neck: Normal ROM. Neck supple. No JVD. No tracheal deviation. CVS: RRR, S1/S2 +, no murmurs,  Pulmonary: Effort and breath sounds normal, no stridor, rhonchi, wheezes, rales.  Abdominal: Soft. BS +,  no distension, tenderness, rebound or guarding.  Musculoskeletal: No cyanosis or leg edema.  Neuro: the patient is awake but confused, unable to exam. Skin: Skin is warm and dry. No rash noted. Sacral stage 1 Psychiatric: flat depressed affect and confused.  LABORATORY PANEL:   CBC  Recent Labs Lab  01/29/17 0453  WBC 8.9  HGB 12.0  HCT 36.2  PLT 320   ------------------------------------------------------------------------------------------------------------------  Chemistries   Recent Labs Lab 01/31/17 0347  NA 136  K 4.1  CL 99*  CO2 28  GLUCOSE 84  BUN 16  CREATININE 1.18*  CALCIUM 8.3*  MG 2.2   ------------------------------------------------------------------------------------------------------------------  Cardiac Enzymes No results for input(s): TROPONINI in the last 168 hours. ------------------------------------------------------------------------------------------------------------------  RADIOLOGY:  No results found.  ASSESSMENT AND PLAN:   71 year old female with a history of depression and chronically bed bound due to spinal stenosis who presented October 5 due to altered mental status.  1.Acute metabolic encephalopathy in the setting of pneumonia and severe depression Patient seen and evaluated by neurology. CT head is negative Encephalopathy slowly improving All sedative medications on hold due to encephalopathy.  MRI brain was negative for acute CVA   As per son, pt was on trazodone before admit, which was restarted.   Due to agitation started haldol injection PRN.  2. HCAP with pleural effusion status post thoracentesis Patient has completed treatment with vancomycin and cefepime ID consultation is appreciated.  3. Bilateral hydronephrosis: As per urology patient,this is probably a chronic issue. Continue Foley catheter at this time Repeat ultrasound shows mild hydronephrosis on the right and resolved on the left Will need foley until urology follow up in 2-3 weeks after discharge.  4.recurrent urinary tract infections: Urine culture positive for Candida sent to lab cor for sensitivities. Continue fluconazole for now  Follow up with ID cx resulted back, ID suggest to give amphoterecin B and  pharmacy to manage labs.  ID had discussed  this with her son and husband in room. Started amphoterecin B. Follow-up BMP.  5. Severe depression: Patient is being followed by psychiatry Due to severe lethargy All sedative medications including those for depression have been discontinued. Family does not want psychiatrist to see patient any more. Dr. Elisabeth PigeonVachhani asked them again, as pt was more agitated, but they refused, so will manage currently with haldol.   6. Hypokalemia:  Continue daily Potassium at home, resumed KCl daily. Improved.  Hypomagnesemia. Given IV magnesium and improved.  7. Pericardial effusion: Patient has been seen and evaluated by cardiology.Echocardiogram shows moderate  pericardial effusion. Patient has no signs of tamponade Patient will need outpatient follow up with Dr Welton FlakesKhan to Reassess pericardial effusion. At present this is asymptomatic. Stable per Dr. Welton FlakesKhan.  8. Essential hypertension: Continue Norvasc   Added metoprolol due to tachycardia.  9. Hyperlipidemia: Continue statin  10. Sacral wound, Present on admission: Cleanse sacral and buttocks area with soap and water.  Apply Gerhardt's butt cream twice daily.  No briefs or disposable underpads while in bed.   Low air loss bed.   11. NSVT: heart rate controlled currently Cardiology following- added metoprolol.  12.  Renal Mass: Will need outpatient UROLOGY follow up.   CSW mentioned LTAC, however patient's family now very upset that this was mentioned   Management plans discussed with the patient's son, husband and he is in agreement. CODE STATUS: FULL  TOTAL TIME TAKING CARE OF THIS PATIENT: 33 minutes.   POSSIBLE D/C 3 days home with HHC , DEPENDING ON CLINICAL CONDITION.   Shaune Pollackhen, Olinda Nola M.D on 01/26/2017 at 3:43 PM  Between 7am to 6pm - Pager - [phone] After 6pm go to [email or web address] - Social research officer, governmentpassword EPAS [facility]  [facility]  Office  [phone]  CC: Primary care physician; [facility]  Note:  This dictation was prepared with Nurse, children'sDragon dictation along with smaller phrase technology. Any transcriptional errors that result from this process are unintentional.

## 2017-02-01 DIAGNOSIS — N200 Calculus of kidney: Secondary | ICD-10-CM

## 2017-02-01 LAB — BASIC METABOLIC PANEL
Anion gap: 4 — ABNORMAL LOW (ref 5–15)
BUN: 15 mg/dL (ref 6–20)
CO2: 30 mmol/L (ref 22–32)
Calcium: 8.1 mg/dL — ABNORMAL LOW (ref 8.9–10.3)
Chloride: 103 mmol/L (ref 101–111)
Creatinine, Ser: 0.83 mg/dL (ref 0.44–1.00)
GFR calc Af Amer: >60 mL/min (ref >60)
GFR calc non Af Amer: >60 mL/min (ref >60)
Glucose, Bld: 90 mg/dL (ref 65–99)
Potassium: 3.8 mmol/L (ref 3.5–5.1)
Sodium: 137 mmol/L (ref 135–145)

## 2017-02-01 LAB — MAGNESIUM: Magnesium: 2.2 mg/dL (ref 1.7–2.4)

## 2017-02-01 MED ORDER — METOPROLOL TARTRATE 25 MG PO TABS
25.0000 mg | ORAL_TABLET | Freq: Two times a day (BID) | ORAL | Status: DC
  Administered 2017-02-01: 25 mg via ORAL
  Filled 2017-02-01 (×2): qty 1

## 2017-02-01 MED ORDER — SODIUM CHLORIDE 0.9 % IV BOLUS (SEPSIS): 500.0000 mL | Freq: Every day | INTRAVENOUS | Status: DC

## 2017-02-01 MED ORDER — ACETAMINOPHEN 325 MG PO TABS: 650.0000 mg | ORAL_TABLET | ORAL | Status: DC

## 2017-02-01 MED ORDER — SODIUM CHLORIDE 0.9 % IV BOLUS (SEPSIS)
500.0000 mL | Freq: Every day | INTRAVENOUS | Status: DC
  Administered 2017-02-01 – 2017-02-02 (×2): 500 mL via INTRAVENOUS

## 2017-02-01 NOTE — Progress Notes (Signed)
[facility] CLINIC INFECTIOUS DISEASE PROGRESS NOTE Date of Admission:  01/15/2017     ID: Jessica [name] is a 71 y.o. female with  Candiduria Principal Problem:   Severe recurrent major depression with psychotic features (HCC) Active Problems:   Pneumonia   Subacute delirium   Altered mental status   Subjective: Started ampho and doing better per family.  ROS  Unable to obtain  Medications:  Antibiotics Given (last 72 hours)    None     . amLODipine  2.5 mg Oral Daily   And  . atorvastatin  10 mg Oral Daily  . aspirin EC  81 mg Oral Daily  . enoxaparin (LOVENOX) injection  40 mg Subcutaneous Q24H  . feeding supplement (ENSURE ENLIVE)  237 mL Oral BID BM  . furosemide  40 mg Oral Daily  . Gerhardt's butt cream   Topical BID  . haloperidol lactate  2 mg Intravenous Once  . levothyroxine  150 mcg Oral QAC breakfast  . metoprolol tartrate  25 mg Oral BID  . mupirocin ointment  1 application Nasal BID  . polyethylene glycol  17 g Oral Daily  . potassium chloride  30 mEq Oral Daily  . pregabalin  150 mg Oral TID  . senna-docusate  2 tablet Oral Daily  . traZODone  50 mg Oral QHS  . venlafaxine XR  150 mg Oral Daily  . vitamin B-12  1,000 mcg Oral Daily  . Vitamin D (Ergocalciferol)  50,000 Units Oral Q30 days    Objective: Vital signs in last 24 hours: Temp:  [97.7 F (36.5 C)-97.9 F (36.6 C)] 97.7 F (36.5 C) (10/22 1116) Pulse Rate:  [84-94] 84 (10/22 1116) Resp:  [16-18] 18 (10/22 1116) BP: (100-114)/(55-58) 114/58 (10/22 1116) SpO2:  [97 %-100 %] 100 % (10/22 1116) Constitutional:  Awake, lying in bed, very sleepy but able to answer yes no questions HENT: [place]/AT, PERRLA, no scleral icterus Mouth/Throat: Oropharynx is clear and dry. No oropharyngeal exudate.  Cardiovascular: Normal rate, regular rhythm distant Pulmonary/Chest: poor effort, bil rhonchi Neck = supple, no nuchal rigidity Abdominal: Soft. obese Bowel sounds are normal.  exhibits no distension.  There is no tenderness.  Lymphadenopathy: no cervical adenopathy. No axillary adenopathy Neurological: sleepy appearing but able to interact  Skin: Skin is warm and dry. No rash noted. No erythema.  Psychiatric:flat affect Lab Results  Recent Labs  01/31/17 0347 02/01/17 0403  NA 136 137  K 4.1 3.8  CL 99* 103  CO2 28 30  BUN 16 15  CREATININE 1.18* 0.83   Microbiology: Results for orders placed or performed during the hospital encounter of 01/15/17  Blood culture (routine x 2)     Status: None   Collection Time: 01/15/17  4:04 PM  Result Value Ref Range Status   Specimen Description BLOOD BLOOD LEFT ARM  Final   Special Requests   Final    BOTTLES DRAWN AEROBIC AND ANAEROBIC Blood Culture adequate volume   Culture NO GROWTH 5 DAYS  Final   Report Status 01/20/2017 FINAL  Final  Blood culture (routine x 2)     Status: None   Collection Time: 01/15/17  4:05 PM  Result Value Ref Range Status   Specimen Description BLOOD LEFT ANTECUBITAL  Final   Special Requests   Final    BOTTLES DRAWN AEROBIC AND ANAEROBIC Blood Culture adequate volume   Culture NO GROWTH 5 DAYS  Final   Report Status 01/20/2017 FINAL  Final  Urine Culture  Status: Abnormal   Collection Time: 01/15/17  8:51 PM  Result Value Ref Range Status   Specimen Description URINE, CATHETERIZED  Final   Special Requests NONE  Final   Culture (A)  Final    >=100,000 COLONIES/mL CANDIDA PARAPSILOSIS SEE SEPARATE REPORT FOR SUSCEPTIBILITIES Performed at [facility], [address] [place], [place] [address]    Report Status 01/28/2017 FINAL  Final  Body fluid culture     Status: None   Collection Time: 01/18/17  9:10 AM  Result Value Ref Range Status   Specimen Description PLEURAL  Final   Special Requests NONE  Final   Gram Stain   Final    ABUNDANT WBC PRESENT,BOTH PMN AND MONONUCLEAR NO ORGANISMS SEEN    Culture   Final    NO GROWTH 3 DAYS Performed at [facility], [address]  [place], [place] [address]    Report Status 01/22/2017 FINAL  Final  MRSA PCR Screening     Status: None   Collection Time: 01/18/17  2:15 PM  Result Value Ref Range Status   MRSA by PCR NEGATIVE NEGATIVE Final    Comment:        The GeneXpert MRSA Assay (FDA approved for NASAL specimens only), is one component of a comprehensive MRSA colonization surveillance program. It is not intended to diagnose MRSA infection nor to guide or monitor treatment for MRSA infections.   Urine Culture     Status: Abnormal   Collection Time: 01/20/17 10:52 PM  Result Value Ref Range Status   Specimen Description URINE, RANDOM  Final   Special Requests Normal  Final   Culture 30,000 COLONIES/mL YEAST (A)  Final   Report Status 01/22/2017 FINAL  Final  Urine Culture     Status: Abnormal   Collection Time: 01/28/17  6:28 PM  Result Value Ref Range Status   Specimen Description URINE, RANDOM  Final   Special Requests NONE  Final   Culture 40,000 COLONIES/mL YEAST (A)  Final   Report Status 01/29/2017 FINAL  Final    Studies/Results: No results found.  Assessment/Plan: Jessica [name] is a 71 y.o. female admitted with AMS and found to have pericardial and pleural effusion as well as PNA.  She also has had recurrent UTIs with yeast for about 6 months. Candiduria is unusual in patients without indwelling urinary caths however she has had multiple positive cultures with pyuria noted on UA.  CT did reveal stones and urology had foley placed for possible neurogenic bladder Cx reveals candida parapsilosis. Sensitivities reviewed and reveal resistant to fluconazole but sensitive to amphotericin. Intermediate to voriconazole. Echinocandins do not treat UTIs.  Recommendations Continue amphotericin. Will plan 5-7 day course Continue management of stones and neurogenic bladder by urology. I think it would be best if we could clear the tiny stones as they will continue to serve as a nidus of infection for a  resistant organism.  I have communicated with Dr Apolinar JunesBrandon.   Thank you very much for the consult. Will follow with you.  Mick SellDavid P Lourine Alberico   02/01/2017, 1:43 PM

## 2017-02-01 NOTE — Progress Notes (Signed)
Patient's son at bedside speaking with Revonda StandardAllison, Rounder for Dining and EVS. Patient overheard vocalizing thoughts of harming himself. Patient stated that he feels "death comes in three's, first my dog, then my mama and then me." Patient also stated that he had been sitting there looking out the window, counting the floors thinking about how far down. I, TBW, interrupted conversation and asked patient was he thinking of harming himself, patient began crying and yelling about his mother's care. I let patient know that I was concerned and asked if he would think of seeking care in our emergency department; patient refused. This nurse vocalized her concerns to the patient and let patient know that I had to report this concern to administration and his safety was of importance while in our facility and presence. Chaplain peeped head in the door and patient requested to speak with him. Patient left the room with chaplain. AD, Letta Kocheriffiany Cooper made aware of concern for patient's son as she entered the room. Patient's father expressed concern and both myself and AD expressed concern for son's well being as reportedly this is not the first time that patient has verbalized these feelings while visiting. Engineer, waterCharge RN and Director also made aware of concern patient's son.

## 2017-02-01 NOTE — Plan of Care (Signed)
Problem: Pain Managment: Goal: General experience of comfort will improve Outcome: Progressing Patient repositioned as tolerated for comfort. Denies any pain to nurse when asked. Patient on bed that turns and repositions patient every two hours to help with sacral breakdown  Problem: Physical Regulation: Goal: Ability to maintain clinical measurements within normal limits will improve Outcome: Progressing Medications given as ordered and in conjunction with family permission Goal: Will remain free from infection Outcome: Progressing No new signs or symptoms of infection noted at this time  Problem: Skin Integrity: Goal: Risk for impaired skin integrity will decrease Outcome: Not Progressing Cream applied to sacral region with skin care performed  Problem: Activity: Goal: Risk for activity intolerance will decrease Outcome: Progressing Patent slept most of the day, but was more alert and interactive per family report, patient appetite slowly increasing and patient did take her meds with applesauce and did start drinking a little more ensure today

## 2017-02-01 NOTE — Care Management (Signed)
Case discussed with attending. 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  Attending waiting to speak with infectious disease for recommendations. RNCM continues to follow and assist with discharge to home when medically stable.

## 2017-02-01 NOTE — Progress Notes (Signed)
OT Cancellation Note  Patient Details Name: Jessica [name] MRN: [id number] DOB: August 10, 1945   Cancelled Treatment:    Reason Eval/Treat Not Completed: Other (comment). Spoke with son out in the hallway. Son reported he had to recently put his dog to sleep and has been struggling with stress of pt's hospital stay, but noted that speaking with the chaplain had been very helpful and that he had just needed to vent and "get his emotions out so they didn't stay bottled up" when speaking with previous staff of self harm. OT provided emotional support to son. Son thankful for support. Son noted pt is more alert and improving with nutritional intake of ensure this date (per son, drank >75% of ensure) and is more "like herself". Son agreeable to OT come next date for OT session with pt/family focused on BUE strengthening to support activity tolerance and self care goals.   Richrd PrimeJamie Stiller, MPH, MS, OTR/L ascom [phone] 02/01/17, 3:24 PM

## 2017-02-01 NOTE — Progress Notes (Signed)
MEDICATION RELATED CONSULT NOTE - INITIAL   Pharmacy Consult for electrolyte monitoring while on amphotericin  Allergies  Allergen Reactions  . Ambien [Zolpidem Tartrate] Other (See Comments)    Reaction: altered mental status  . Amoxicillin Other (See Comments)    Reaction: blisters in mouth Has patient had a PCN reaction causing immediate rash, facial/tongue/throat swelling, SOB or lightheadedness with hypotension: Yes Has patient had a PCN reaction causing severe rash involving mucus membranes or skin necrosis: No Has patient had a PCN reaction that required hospitalization No Has patient had a PCN reaction occurring within the last 10 years: Yes If all of the above answers are "NO", then may proceed with Cephalosporin use.     Patient Measurements: Height: 5\' 10"  (177.8 cm) Weight: 205 lb (93 kg) IBW/kg (Calculated) : 68.5  Intake/Output from previous day: 10/21 0701 - 10/22 0700 In: 2068.8 [P.O.:360; I.V.:1378.8; IV Piggyback:330] Out: 675 [Urine:675] Intake/Output from this shift: No intake/output data recorded.  Labs:  Recent Labs  01/30/17 0358 01/31/17 0347 02/01/17 0403  CREATININE 0.77 1.18* 0.83  MG 1.6* 2.2 2.2   Lab Results  Component Value Date   K 3.8 02/01/2017    Estimated Creatinine Clearance: 79.1 mL/min (by C-G formula based on SCr of 0.83 mg/dL).  Assessment: Pharmacy monitoring electrolytes (specifically K and Mg) in this 71 year old female while on amphotericin for UTI.  Goal of Therapy: Electrolytes WNL  Plan:  K = 3.8, Mg = 2.2. No supplementation needed at this time. Will recheck with AM labs tomorrow.  Cindi CarbonMary M Keanu Lesniak, PharmD, BCPS Clinical Pharmacist 02/01/2017,9:15 AM

## 2017-02-01 NOTE — Progress Notes (Signed)
Clinical Education officer, museum (CSW) received verbal consult from nursing staff that patient's son Jessica [name] has made comments about hurting himself. CSW and chaplain met with Jessica [name] on 1A waiting room to provide support. Jessica [name] repeatedly stated that he is not going to hurt himself because he has too much to live for. Jessica [name] reported that he wants to live to take care of his mother and "doesn't have the guts to kill himself." CSW provided emotional support. Jessica [name] reported that he has not liked the nursing care at [facility] and mentioned suing the hospital several times. Security was called and also spoke with Jessica [name]. CSW will continue to follow and assist as needed.   [employer], LCSW [phone]

## 2017-02-01 NOTE — Progress Notes (Signed)
SUBJECTIVE: Pt resting comfortably.   Vitals:   01/30/17 1952 01/31/17 1111 01/31/17 1458 01/31/17 2030  BP: 112/61 (!) 98/44 (!) 100/58 (!) 109/55  Pulse: (!) 106 87 94 88  Resp: 15  16 16   Temp: 99.4 F (37.4 C)  97.9 F (36.6 C) 97.7 F (36.5 C)  TempSrc: Oral  Oral Oral  SpO2: 94% 96% 99% 97%  Weight:      Height:        Intake/Output Summary (Last 24 hours) at 02/01/17 1026 Last data filed at 02/01/17 0212  Gross per 24 hour  Intake          2068.75 ml  Output              475 ml  Net          1593.75 ml    LABS: Basic Metabolic Panel:  Recent Labs  46/96/2910/21/18 0347 02/01/17 0403  NA 136 137  K 4.1 3.8  CL 99* 103  CO2 28 30  GLUCOSE 84 90  BUN 16 15  CREATININE 1.18* 0.83  CALCIUM 8.3* 8.1*  MG 2.2 2.2   Liver Function Tests: No results for input(s): AST, ALT, ALKPHOS, BILITOT, PROT, ALBUMIN in the last 72 hours. No results for input(s): LIPASE, AMYLASE in the last 72 hours. CBC: No results for input(s): WBC, NEUTROABS, HGB, HCT, MCV, PLT in the last 72 hours. Cardiac Enzymes: No results for input(s): CKTOTAL, CKMB, CKMBINDEX, TROPONINI in the last 72 hours. BNP: Invalid input(s): POCBNP D-Dimer: No results for input(s): DDIMER in the last 72 hours. Hemoglobin A1C: No results for input(s): HGBA1C in the last 72 hours. Fasting Lipid Panel: No results for input(s): CHOL, HDL, LDLCALC, TRIG, CHOLHDL, LDLDIRECT in the last 72 hours. Thyroid Function Tests: No results for input(s): TSH, T4TOTAL, T3FREE, THYROIDAB in the last 72 hours.  Invalid input(s): FREET3 Anemia Panel: No results for input(s): VITAMINB12, FOLATE, FERRITIN, TIBC, IRON, RETICCTPCT in the last 72 hours.   PHYSICAL EXAM General: Well developed, well nourished, in no acute distress HEENT:  Normocephalic and atramatic Neck:  No JVD.  Lungs: Clear bilaterally to auscultation and percussion. Heart: HRRR . Normal S1 and S2 without gallops or murmurs.  Abdomen: Bowel sounds are  positive, abdomen soft and non-tender  Msk:  Back normal, normal gait. Normal strength and tone for age. Extremities: No clubbing, cyanosis or edema.   Neuro: Resting comfortably   TELEMETRY: NST 82bpm   ASSESSMENT AND PLAN: History of pericardial effusion. Clinically stable from cardiac perspective with heart rate better controlled on metoprolol and potassium status stable over the past few days. Advise outpatient [facility] at [facility] when ready for discharge.  Principal Problem:   Severe recurrent major depression with psychotic features [facility](HCC) Active Problems:   Pneumonia   Subacute delirium   Altered mental status    Caroleen HammanKristin Kischa Altice, NP-C 02/01/2017 10:26 AM

## 2017-02-01 NOTE — Progress Notes (Signed)
[facility] [place] [facility]   [name]    MR#:  [id number]  DATE OF BIRTH:  08-11-1945  SUBJECTIVE:   The patient is more awake, more oral intake and then yesterday per her husband. No agitation per RN. Tachycardia improved. REVIEW OF SYSTEMS:    Review of Systems  He patient is confused, unable to get ROS.  DRUG ALLERGIES:   Allergies  Allergen Reactions  . Ambien [Zolpidem Tartrate] Other (See Comments)    Reaction: altered mental status  . Amoxicillin Other (See Comments)    Reaction: blisters in mouth Has patient had a PCN reaction causing immediate rash, facial/tongue/throat swelling, SOB or lightheadedness with hypotension: Yes Has patient had a PCN reaction causing severe rash involving mucus membranes or skin necrosis: No Has patient had a PCN reaction that required hospitalization No Has patient had a PCN reaction occurring within the last 10 years: Yes If all of the above answers are "NO", then may proceed with Cephalosporin use.     VITALS:  Blood pressure (!) 114/58, pulse 84, temperature 97.7 F (36.5 C), temperature source Axillary, resp. rate 18, height 5\' 10"  (1.778 m), weight 205 lb (93 kg), SpO2 100 %.  PHYSICAL EXAMINATION:  Constitutional: Appears obese No distress. Morbid obesity. HENT: Normocephalic.   Eyes: Conjunctivae and EOM are normal. PERRLA, no scleral icterus.  Neck: Normal ROM. Neck supple. No JVD. No tracheal deviation. CVS: RRR, S1/S2 +, no murmurs,  Pulmonary: Effort and breath sounds normal, no stridor, rhonchi, wheezes, rales.  Abdominal: Soft. BS +,  no distension, tenderness, rebound or guarding.  Musculoskeletal: No cyanosis or leg edema.  Neuro: the patient is awake but confused, unable to exam. Skin: Skin is warm and dry. No rash noted. Sacral stage 1 Psychiatric: flat depressed affect and confused.  LABORATORY PANEL:   CBC  Recent Labs Lab 01/29/17 0453  WBC 8.9  HGB 12.0   HCT 36.2  PLT 320   ------------------------------------------------------------------------------------------------------------------  Chemistries   Recent Labs Lab 02/01/17 0403  NA 137  K 3.8  CL 103  CO2 30  GLUCOSE 90  BUN 15  CREATININE 0.83  CALCIUM 8.1*  MG 2.2   ------------------------------------------------------------------------------------------------------------------  Cardiac Enzymes No results for input(s): TROPONINI in the last 168 hours. ------------------------------------------------------------------------------------------------------------------  RADIOLOGY:  No results found.  ASSESSMENT AND PLAN:   71 year old female with a history of depression and chronically bed bound due to spinal stenosis who presented October 5 due to altered mental status.  1.Acute metabolic encephalopathy in the setting of pneumonia and severe depression Patient seen and evaluated by neurology. CT head is negative Encephalopathy slowly improving All sedative medications on hold due to encephalopathy.  MRI brain was negative for acute CVA   As per son, pt was on trazodone before admit, which was restarted.   Due to agitation started haldol injection PRN.  2. HCAP with pleural effusion status post thoracentesis Patient has completed treatment with vancomycin and cefepime ID consultation is appreciated.  3. Bilateral hydronephrosis: As per urology patient,this is probably a chronic issue. Continue Foley catheter at this time Repeat ultrasound shows mild hydronephrosis on the right and resolved on the left Will need foley until urology follow up in 2-3 weeks after discharge.  4.recurrent urinary tract infections: Urine culture positive for Candida sent to lab cor for sensitivities. cx resulted back, ID suggested to give amphoterecin B and pharmacy to manage labs. Continue amphotericin. Will plan 5-7 day course, continue management of stones  and neurogenic bladder  by urology per Dr. Sampson GoonFitzgerald (he discussed with Dr. Apolinar JunesBrandon).  5. Severe depression: Patient is being followed by psychiatry Due to severe lethargy All sedative medications including those for depression have been discontinued. Family does not want psychiatrist to see patient any more. Dr. Elisabeth PigeonVachhani asked them again, as pt was more agitated, but they refused, so will manage currently with haldol.   6. Hypokalemia:  Continue daily Potassium at home, resumed KCl daily. Improved.  Hypomagnesemia. Given IV magnesium and improved.  7. Pericardial effusion: Patient has been seen and evaluated by cardiology.Echocardiogram shows moderate  pericardial effusion. Patient has no signs of tamponade Patient will need outpatient follow up with Dr Welton FlakesKhan to Reassess pericardial effusion. At present this is asymptomatic. Stable per Dr. Welton FlakesKhan.  8. Essential hypertension: Continue Norvasc   Added metoprolol due to tachycardia.  9. Hyperlipidemia: Continue statin  10. Sacral DU stage 1, Present on admission: Cleanse sacral and buttocks area with soap and water.  Apply Gerhardt's butt cream twice daily.  No briefs or disposable underpads while in bed.   Low air loss bed.   11. NSVT: heart rate controlled currently Cardiology following- added metoprolol. Hold Lopressor if blood pressure is low.  12.  Renal Mass: Will need outpatient UROLOGY follow up.  And discussed with Dr. Sampson GoonFitzgerald. Management plans discussed with the patient's husband and he is in agreement. CODE STATUS: FULL  TOTAL TIME TAKING CARE OF THIS PATIENT: 33 minutes.   POSSIBLE D/C 3 days home with HH , DEPENDING ON CLINICAL CONDITION.   Shaune Pollackhen, Lamyah Creed M.D on 01/26/2017 at 2:55 PM  Between 7am to 6pm - Pager - [phone] After 6pm go to [email or web address] - Social research officer, governmentpassword EPAS [facility]  [facility] [place] [facility]  Office  [phone]  CC: Primary care physician; [facility], [facility]  Note: This dictation was prepared with  Nurse, children'sDragon dictation along with smaller phrase technology. Any transcriptional errors that result from this process are unintentional.

## 2017-02-01 NOTE — Progress Notes (Signed)
Per report, pt has poor intake this morning. Pt is more alert, was able to take medicines and tolerated well. Offered some more applesauce and juice but pt declined. Coughing noted, pt able to expectorate some secretions. Oral suctioning done after. [name] at bedside.

## 2017-02-01 NOTE — Progress Notes (Signed)
Urology Consult Follow Up  Subjective: Currently on amphotericin for resistant yeast in urine.  Creatinine 0.83 today with Foley in place.  Discussed with Dr. Sampson Goon, ideally ureteroscopy could be done while being covered on amphotericin, during this admission.  Addressed with patient and her son today at bedside.  Anti-infectives: Anti-infectives    Start     Dose/Rate Route Frequency Ordered Stop   02/01/17 1100  amphotericin B lipid (ABELCET) 400 mg in dextrose 5 % 250 mL IVPB    Comments:  DO NOT FLUSH WITH NORMAL SALINE- USE D5W AS ORDERED.   400 mg 165 mL/hr over 120 Minutes Intravenous Every 24 hours 01/31/17 1134     01/30/17 1100  amphotericin B lipid (ABELCET) 400 mg in dextrose 5 % 250 mL IVPB  Status:  Discontinued     400 mg 165 mL/hr over 120 Minutes Intravenous Every 24 hours 01/30/17 1026 01/31/17 1134   01/29/17 1630  amphotericin B lipid (ABELCET) 400 mg in dextrose 5 % 250 mL IVPB  Status:  Discontinued     400 mg 165 mL/hr over 120 Minutes Intravenous Every 24 hours 01/29/17 1514 01/30/17 1026   01/23/17 1000  fluconazole (DIFLUCAN) tablet 200 mg  Status:  Discontinued     200 mg Oral Daily 01/22/17 1506 01/29/17 1514   01/21/17 1600  ceFEPIme (MAXIPIME) 2 g in dextrose 5 % 50 mL IVPB  Status:  Discontinued     2 g 100 mL/hr over 30 Minutes Intravenous Every 8 hours 01/21/17 0853 01/22/17 1439   01/21/17 1500  vancomycin (VANCOCIN) IVPB 750 mg/150 ml premix     750 mg 150 mL/hr over 60 Minutes Intravenous  Once 01/21/17 1058 01/21/17 1808   01/20/17 1100  vancomycin (VANCOCIN) IVPB 1000 mg/200 mL premix     1,000 mg 200 mL/hr over 60 Minutes Intravenous  Once 01/20/17 1024 01/20/17 1429   01/19/17 1100  vancomycin (VANCOCIN) IVPB 1000 mg/200 mL premix     1,000 mg 200 mL/hr over 60 Minutes Intravenous STAT 01/19/17 1054 01/19/17 1231   01/19/17 0630  vancomycin (VANCOCIN) IVPB 1000 mg/200 mL premix  Status:  Discontinued     1,000 mg 200 mL/hr over 60 Minutes  Intravenous Every 24 hours 01/19/17 0616 01/19/17 1054   01/16/17 0400  aztreonam (AZACTAM) 1 g in dextrose 5 % 50 mL IVPB  Status:  Discontinued     1 g 100 mL/hr over 30 Minutes Intravenous Every 8 hours 01/15/17 2023 01/15/17 2146   01/15/17 2200  ceFEPIme (MAXIPIME) 2 g in dextrose 5 % 50 mL IVPB  Status:  Discontinued     2 g 100 mL/hr over 30 Minutes Intravenous Every 8 hours 01/15/17 2146 01/21/17 0853   01/15/17 2130  fluconazole (DIFLUCAN) tablet 200 mg  Status:  Discontinued     200 mg Oral Daily 01/15/17 2127 01/22/17 1439   01/15/17 2015  aztreonam (AZACTAM) 2 g in dextrose 5 % 50 mL IVPB     2 g 100 mL/hr over 30 Minutes Intravenous  Once 01/15/17 2012 01/15/17 2139   01/15/17 2015  vancomycin (VANCOCIN) IVPB 1000 mg/200 mL premix  Status:  Discontinued     1,000 mg 200 mL/hr over 60 Minutes Intravenous  Once 01/15/17 2012 01/19/17 1054   01/15/17 0300  vancomycin (VANCOCIN) 1,250 mg in sodium chloride 0.9 % 250 mL IVPB  Status:  Discontinued     1,250 mg 166.7 mL/hr over 90 Minutes Intravenous Every 12 hours 01/15/17 2023 01/17/17 1611  Current Facility-Administered Medications  Medication Dose Route Frequency Provider Last Rate Last Dose  . acetaminophen (TYLENOL) tablet 650 mg  650 mg Oral Q6H PRN Houston Siren, MD       Or  . acetaminophen (TYLENOL) suppository 650 mg  650 mg Rectal Q6H PRN Houston Siren, MD      . Melene Muller ON 02/02/2017] acetaminophen (TYLENOL) tablet 650 mg  650 mg Oral Q24H Mick Sell, MD      . albuterol (PROVENTIL) (2.5 MG/3ML) 0.083% nebulizer solution 3 mL  3 mL Inhalation Q4H PRN Houston Siren, MD   3 mL at 01/24/17 0454  . amLODipine (NORVASC) tablet 2.5 mg  2.5 mg Oral Daily Houston Siren, MD   2.5 mg at 01/29/17 1041   And  . atorvastatin (LIPITOR) tablet 10 mg  10 mg Oral Daily Houston Siren, MD   10 mg at 02/01/17 1137  . amphotericin B lipid (ABELCET) 400 mg in dextrose 5 % 250 mL IVPB  400 mg Intravenous Q24H  Shaune Pollack, MD   Stopped at 02/01/17 1451  . aspirin EC tablet 81 mg  81 mg Oral Daily Houston Siren, MD   81 mg at 02/01/17 1126  . dextrose 5 % solution   Intravenous Daily Shaune Pollack, MD 10 mL/hr at 02/01/17 1119    . enoxaparin (LOVENOX) injection 40 mg  40 mg Subcutaneous Q24H Houston Siren, MD   40 mg at 01/31/17 2148  . feeding supplement (ENSURE ENLIVE) (ENSURE ENLIVE) liquid 237 mL  237 mL Oral BID BM Gouru, Aruna, MD   237 mL at 02/01/17 1125  . fluticasone (FLONASE) 50 MCG/ACT nasal spray 2 spray  2 spray Each Nare Daily PRN Houston Siren, MD      . furosemide (LASIX) tablet 40 mg  40 mg Oral Daily Shaune Pollack, MD   40 mg at 01/29/17 1040  . gadobenate dimeglumine (MULTIHANCE) injection 19 mL  19 mL Intravenous Once PRN Adrian Saran, MD      . Gerhardt's butt cream   Topical BID Adrian Saran, MD   1 application at 01/31/17 2152  . haloperidol lactate (HALDOL) injection 2 mg  2 mg Intravenous Once Arnaldo Natal, MD   Stopped at 01/29/17 1610  . haloperidol lactate (HALDOL) injection 2 mg  2 mg Intravenous Q8H PRN Altamese Dilling, MD   2 mg at 01/29/17 1547  . levothyroxine (SYNTHROID, LEVOTHROID) tablet 150 mcg  150 mcg Oral QAC breakfast Houston Siren, MD   150 mcg at 02/01/17 1127  . liver oil-zinc oxide (DESITIN) 40 % ointment 1 application  1 application Topical PRN Houston Siren, MD      . metoprolol tartrate (LOPRESSOR) injection 2.5 mg  2.5 mg Intravenous Q4H PRN Shaune Pollack, MD      . metoprolol tartrate (LOPRESSOR) tablet 25 mg  25 mg Oral BID Shaune Pollack, MD      . mupirocin ointment (BACTROBAN) 2 % 1 application  1 application Nasal BID Houston Siren, MD   1 application at 02/01/17 1149  . ondansetron (ZOFRAN) tablet 4 mg  4 mg Oral Q6H PRN Houston Siren, MD       Or  . ondansetron (ZOFRAN) injection 4 mg  4 mg Intravenous Q6H PRN Sainani, Rolly Pancake, MD      . polyethylene glycol (MIRALAX / GLYCOLAX) packet 17 g  17 g Oral Daily Sainani, Rolly Pancake, MD    17 g at  01/29/17 1043  . potassium chloride (K-DUR,KLOR-CON) CR tablet 30 mEq  30 mEq Oral Daily Altamese Dilling, MD   30 mEq at 01/29/17 1039  . pregabalin (LYRICA) capsule 150 mg  150 mg Oral TID Houston Siren, MD   150 mg at 02/01/17 1140  . senna-docusate (Senokot-S) tablet 2 tablet  2 tablet Oral Daily Houston Siren, MD   2 tablet at 02/01/17 1133  . [START ON 02/02/2017] sodium chloride 0.9 % bolus 500 mL  500 mL Intravenous Daily Mick Sell, MD      . sodium chloride 0.9 % bolus 500 mL  500 mL Intravenous Daily Mick Sell, MD 500 mL/hr at 02/01/17 1452 500 mL at 02/01/17 1452  . traZODone (DESYREL) tablet 50 mg  50 mg Oral QHS Altamese Dilling, MD   50 mg at 01/31/17 2145  . venlafaxine XR (EFFEXOR-XR) 24 hr capsule 150 mg  150 mg Oral Daily Clapacs, John T, MD   150 mg at 02/01/17 1142  . vitamin B-12 (CYANOCOBALAMIN) tablet 1,000 mcg  1,000 mcg Oral Daily Houston Siren, MD   1,000 mcg at 02/01/17 1132  . Vitamin D (Ergocalciferol) (DRISDOL) capsule 50,000 Units  50,000 Units Oral Q30 days Houston Siren, MD   50,000 Units at 01/16/17 0945     Objective: Vital signs in last 24 hours: Temp:  [97.7 F (36.5 C)] 97.7 F (36.5 C) (10/22 1116) Pulse Rate:  [84-88] 84 (10/22 1116) Resp:  [16-18] 18 (10/22 1116) BP: (109-114)/(55-58) 114/58 (10/22 1116) SpO2:  [97 %-100 %] 100 % (10/22 1116)  Intake/Output from previous day: 10/21 0701 - 10/22 0700 In: 2068.8 [P.O.:360; I.V.:1378.8; IV Piggyback:330] Out: 675 [Urine:675] Intake/Output this shift: Total I/O In: 325 [P.O.:240; Other:85] Out: -    Physical Exam  Constitutional: She is oriented to person, place, and time and well-developed, well-nourished, and in no distress.  Alert, answers most questions appropriately.  Son at bedside.  HENT:  Head: Normocephalic and atraumatic.  Pulmonary/Chest: No respiratory distress.  Abdominal: Soft.  Genitourinary:  Genitourinary Comments: Foley  catheter in place draining clear yellow urine.  Neurological: She is alert and oriented to person, place, and time.  Skin: Skin is warm and dry.    Lab Results:  BMET  Recent Labs  01/31/17 0347 02/01/17 0403  NA 136 137  K 4.1 3.8  CL 99* 103  CO2 28 30  GLUCOSE 84 90  BUN 16 15  CREATININE 1.18* 0.83  CALCIUM 8.3* 8.1*    Assessment/ Plan:  71 year old female with multiple GU issues admitted with altered mental status, pericardial/pleural effusions with pneumonia, recurrent yeast UTIs.  Case discussed with Dr. Sampson Goon.  She does have several small right renal calculi measuring up to 4 mm along with layering milk of calcium.  This may be a nidus for recurrent urinary tract infections.  As such, she was offered right ureteroscopy, laser lithotripsy, right ureteral stent placement during this admission.  We will tentatively plan for this on Wednesday.  Risks and benefits of ureteroscopy were reviewed including but not limited to infection, bleeding, pain, ureteral injury which could require open surgery versus prolonged indwelling if ureteralperforation occurs, persistent stone disease, requirement for staged procedure, possible stent, and global anesthesia risks. Patient expressed understanding and desires to proceed with ureteroscopy.   1.  Candiduria-continue amphotericin per Dr. Sampson Goon.  Plan for right ureteroscopy to remove stones as possible nidus as discussed above.  2.  Right renal stones- possible nidus for  infection.  Will treat Wednesday.  3.  Neurogenic bladder with bilateral hydro--hydronephrosis improved with Foley catheter.  Continue this for the time being.   4.  Left indeterminate renal lesion, 2.6 cm- outpatient CT with and without contrast ordered.     LOS: 17 days    Jessica [name] 02/01/2017

## 2017-02-01 NOTE — Progress Notes (Signed)
Nutrition Follow-up  DOCUMENTATION CODES:   Obesity unspecified  INTERVENTION:  Recommend placement of small bore feeding tube given patient not meeting calorie needs for 2 weeks and has refused oral nutrition supplements for 3 days now.  This may not be in line with patient/family's GOC.  NUTRITION DIAGNOSIS:   Inadequate oral intake related to acute illness as evidenced by per patient/family report, meal completion < 25%. 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 Spoke with Son who is HCPOA. PO intake has been poor. Had some bites of pancakes yesterday. Was consuming ensure on 10/17 and 10/18 but refused then until today. RD raised concern about patient's PO Intake over the past 2 weeks and introduced the possibility of patient needing a feeding tube until she can take PO again. Son became overwhelmed, sobbing, saying "I can't deal with this." Father stated that "this is not a good time to talk about this, he just had to put his dog down." Attempted to comfort Son, stated RD would come back at a later time. Discussed with RN and MD Imogene Burnhen.   Meal Completion: 0% Labs reviewed  Diet Order:  Diet Heart Room service appropriate? Yes; Fluid consistency: Thin  Skin:  Wound (see comment) (Deep tissue injury to L ankle)  Last BM:  01/31/2017 (type 4)  Height:   Ht Readings from Last 1 Encounters:  01/15/17 5\' 10"  (1.778 m)    Weight:   Wt Readings from Last 1 Encounters:  01/15/17 205 lb (93 kg)    Ideal Body Weight:  68.18 kg  BMI:  Body mass  index is 29.41 kg/m.  Estimated Nutritional Needs:   Kcal:  1800-2000 calories (MSJ x1.2-1.3)  Protein:  112-130 grams  Fluid:  1.8-2L  EDUCATION NEEDS:   No education needs identified at this time  Dionne AnoWilliam M. Joanny Dupree, MS, RD LDN Inpatient Clinical Dietitian Pager [phone]

## 2017-02-01 NOTE — Progress Notes (Signed)
Chaplain made a follow-up visit with pt. Pt calm and seemed to be doing well. Son and husband states pt has been responding well to medicine is getting now. [name] wanted to have a private conversation with [facility] as a follow-up to consult CH had with him on Thursday. He complained that no one was listening to him but he was not angry. He stated he was grieving a loss of his dog which was put to sleep last Saturday; a decision he was not happy with, but had no other choice. Son told CH that he vented his feeling of displeasure with medical team and had said he was feeling like killing himself. CH asked him if he had suicidal thoughts. He denied having suicidal thoughts but admited to venting feelings of helplessness, frustration, and feeling down in the face of many life challenges coming at him at once. However, he expressed optimism about the medicine his mom is on now, expressed gratitude to few nurses whom he stated were attentive to his concerns, and [facility]. [name] appeared emotional and psychologically overwhelmed, having not been sleeping or eating well lately, and stated that he has an appointment to see his therapist this Friday. CH encouraged him to see his therapist, provided empathetic, pastoral support and presence.

## 2017-02-02 ENCOUNTER — Inpatient Hospital Stay: Payer: Medicare Other

## 2017-02-02 LAB — BLOOD GAS, ARTERIAL
Acid-base deficit: 0.1 mmol/L (ref 0.0–2.0)
Bicarbonate: 30.3 mmol/L — ABNORMAL HIGH (ref 20.0–28.0)
FIO2: 0.32
O2 Saturation: 79.5 %
Patient temperature: 37
pCO2 arterial: 87 mmHg (ref 32.0–48.0)
pH, Arterial: 7.15 — CL (ref 7.350–7.450)
pO2, Arterial: 57 mmHg — ABNORMAL LOW (ref 83.0–108.0)

## 2017-02-02 LAB — CBC
HCT: 33.7 % — ABNORMAL LOW (ref 35.0–47.0)
Hemoglobin: 10.5 g/dL — ABNORMAL LOW (ref 12.0–16.0)
MCH: 25.7 pg — ABNORMAL LOW (ref 26.0–34.0)
MCHC: 31.1 g/dL — ABNORMAL LOW (ref 32.0–36.0)
MCV: 82.5 fL (ref 80.0–100.0)
Platelets: 264 10*3/uL (ref 150–440)
RBC: 4.08 MIL/uL (ref 3.80–5.20)
RDW: 16 % — ABNORMAL HIGH (ref 11.5–14.5)
WBC: 5 10*3/uL (ref 3.6–11.0)

## 2017-02-02 LAB — CBC WITH DIFFERENTIAL/PLATELET
Basophils Absolute: 0.1 10*3/uL (ref 0–0.1)
Basophils Relative: 1 %
Eosinophils Absolute: 0 10*3/uL (ref 0–0.7)
Eosinophils Relative: 1 %
HCT: 35.8 % (ref 35.0–47.0)
Hemoglobin: 11.1 g/dL — ABNORMAL LOW (ref 12.0–16.0)
Lymphocytes Relative: 13 %
Lymphs Abs: 0.7 10*3/uL — ABNORMAL LOW (ref 1.0–3.6)
MCH: 26 pg (ref 26.0–34.0)
MCHC: 31.1 g/dL — ABNORMAL LOW (ref 32.0–36.0)
MCV: 83.6 fL (ref 80.0–100.0)
Monocytes Absolute: 0.4 10*3/uL (ref 0.2–0.9)
Monocytes Relative: 8 %
Neutro Abs: 4.4 10*3/uL (ref 1.4–6.5)
Neutrophils Relative %: 77 %
Platelets: 269 10*3/uL (ref 150–440)
RBC: 4.28 MIL/uL (ref 3.80–5.20)
RDW: 16.3 % — ABNORMAL HIGH (ref 11.5–14.5)
WBC: 5.6 10*3/uL (ref 3.6–11.0)

## 2017-02-02 LAB — BASIC METABOLIC PANEL
Anion gap: 5 (ref 5–15)
Anion gap: 7 (ref 5–15)
BUN: 17 mg/dL (ref 6–20)
BUN: 17 mg/dL (ref 6–20)
CO2: 31 mmol/L (ref 22–32)
CO2: 28 mmol/L (ref 22–32)
Calcium: 8.2 mg/dL — ABNORMAL LOW (ref 8.9–10.3)
Calcium: 8.2 mg/dL — ABNORMAL LOW (ref 8.9–10.3)
Chloride: 101 mmol/L (ref 101–111)
Chloride: 103 mmol/L (ref 101–111)
Creatinine, Ser: 1.29 mg/dL — ABNORMAL HIGH (ref 0.44–1.00)
Creatinine, Ser: 1.63 mg/dL — ABNORMAL HIGH (ref 0.44–1.00)
GFR calc Af Amer: 48 mL/min — ABNORMAL LOW (ref >60)
GFR calc Af Amer: 36 mL/min — ABNORMAL LOW (ref >60)
GFR calc non Af Amer: 41 mL/min — ABNORMAL LOW (ref >60)
GFR calc non Af Amer: 31 mL/min — ABNORMAL LOW (ref >60)
Glucose, Bld: 88 mg/dL (ref 65–99)
Glucose, Bld: 105 mg/dL — ABNORMAL HIGH (ref 65–99)
Potassium: 3.9 mmol/L (ref 3.5–5.1)
Potassium: 4.4 mmol/L (ref 3.5–5.1)
Sodium: 137 mmol/L (ref 135–145)
Sodium: 138 mmol/L (ref 135–145)

## 2017-02-02 LAB — HEPATIC FUNCTION PANEL
ALT: 14 U/L (ref 14–54)
AST: 20 U/L (ref 15–41)
Albumin: 2 g/dL — ABNORMAL LOW (ref 3.5–5.0)
Alkaline Phosphatase: 102 U/L (ref 38–126)
Bilirubin, Direct: 0.2 mg/dL (ref 0.1–0.5)
Indirect Bilirubin: 0.4 mg/dL (ref 0.3–0.9)
Total Bilirubin: 0.6 mg/dL (ref 0.3–1.2)
Total Protein: 5.9 g/dL — ABNORMAL LOW (ref 6.5–8.1)

## 2017-02-02 LAB — MAGNESIUM: Magnesium: 2.2 mg/dL (ref 1.7–2.4)

## 2017-02-02 LAB — PROTIME-INR
INR: 1.15
Prothrombin Time: 14.6 seconds (ref 11.4–15.2)

## 2017-02-02 LAB — GLUCOSE, CAPILLARY
Glucose-Capillary: 108 mg/dL — ABNORMAL HIGH (ref 65–99)
Glucose-Capillary: 95 mg/dL (ref 65–99)

## 2017-02-02 LAB — PHOSPHORUS: Phosphorus: 5.5 mg/dL — ABNORMAL HIGH (ref 2.5–4.6)

## 2017-02-02 MED ORDER — SODIUM CHLORIDE 0.9 % IV BOLUS FOR AMBISOME
500.0000 mL | INTRAVENOUS | Status: DC
Start: 2017-02-03 — End: 2017-02-03
  Administered 2017-02-03: 500 mL via INTRAVENOUS
  Filled 2017-02-02: qty 500

## 2017-02-02 MED ORDER — DIPHENHYDRAMINE HCL 50 MG/ML IJ SOLN: 25.0000 mg | Freq: Every day | INTRAMUSCULAR | Status: DC | PRN

## 2017-02-02 MED ORDER — ACETAMINOPHEN 325 MG PO TABS
650.0000 mg | ORAL_TABLET | ORAL | Status: DC
  Administered 2017-02-02: 650 mg via ORAL
  Filled 2017-02-02: qty 2

## 2017-02-02 MED ORDER — ACETAMINOPHEN 325 MG PO TABS: 650.0000 mg | ORAL_TABLET | Freq: Every day | ORAL | Status: DC | PRN

## 2017-02-02 MED ORDER — AMPHOTERICIN B 50 MG IJ SOLR
40.0000 mg | INTRAMUSCULAR | Status: DC
  Administered 2017-02-03: 40 mg via INTRAVENOUS
  Filled 2017-02-02: qty 40

## 2017-02-02 MED ORDER — SODIUM CHLORIDE 0.9 % IV BOLUS (SEPSIS)
500.0000 mL | Freq: Every day | INTRAVENOUS | Status: DC
  Administered 2017-02-02: 500 mL via INTRAVENOUS

## 2017-02-02 MED ORDER — SODIUM CHLORIDE 0.9 % IV BOLUS FOR AMBISOME
500.0000 mL | INTRAVENOUS | Status: DC
  Administered 2017-02-03: 500 mL via INTRAVENOUS
  Filled 2017-02-02: qty 500

## 2017-02-02 MED ORDER — MEPERIDINE HCL 50 MG/ML IJ SOLN: 25.0000 mg | INTRAMUSCULAR | Status: DC | PRN

## 2017-02-02 MED ORDER — COLLAGENASE 250 UNIT/GM EX OINT
TOPICAL_OINTMENT | Freq: Every day | CUTANEOUS | Status: DC
  Administered 2017-02-02 – 2017-02-11 (×8): via TOPICAL
  Filled 2017-02-02: qty 30

## 2017-02-02 MED ORDER — DIPHENHYDRAMINE HCL 25 MG PO CAPS
25.0000 mg | ORAL_CAPSULE | Freq: Every day | ORAL | Status: DC | PRN
  Filled 2017-02-02: qty 1

## 2017-02-02 MED ORDER — SODIUM CHLORIDE 0.9 % IV SOLN
Freq: Once | INTRAVENOUS | Status: AC
  Administered 2017-02-02: 14:00:00 via INTRAVENOUS

## 2017-02-02 MED ORDER — SODIUM CHLORIDE 0.9 % IV SOLN
0.0000 ug/min | INTRAVENOUS | Status: DC
  Administered 2017-02-02: 20 ug/min via INTRAVENOUS
  Administered 2017-02-03 (×2): 50 ug/min via INTRAVENOUS
  Filled 2017-02-02 (×3): qty 1

## 2017-02-02 NOTE — Progress Notes (Signed)
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 Patient previously on fluconazole. ID following.   Plan: Lipid formulations of Amphotericin B appear to NOT achieve urine concentrations adequate to treat UTIs and should not be used.   Will start Amphotericin B Deoxycholate 40mg  (~0.5mg /kg) daily.   Pre and post NaCl 500mg  infusions ordered.   Acetaminophen, meperidine, and diphenhydramine PRN orders placed.   Height: 5\' 10"  (177.8 cm) Weight: 184 lb (83.5 kg) IBW/kg (Calculated) : 68.5  Temp (24hrs), Avg:97.7 F (36.5 C), Min:97.6 F (36.4 C), Max:97.8 F (36.6 C)   Recent Labs Lab 01/27/17 0417  01/29/17 0453 01/30/17 0358 01/31/17 0347 02/01/17 0403 02/02/17 0504  WBC 8.7  --  8.9  --   --   --  5.0  CREATININE 0.58  < > 0.61 0.77 1.18* 0.83 1.29*  < > = values in this interval not displayed.  Estimated Creatinine Clearance: 48.4 mL/min (A) (by C-G formula based on SCr of 1.29 mg/dL (H)).    Allergies  Allergen Reactions  . Ambien [Zolpidem Tartrate] Other (See Comments)    Reaction: altered mental status  . Amoxicillin Other (See Comments)    Reaction: blisters in mouth Has patient had a PCN reaction causing immediate rash, facial/tongue/throat swelling, SOB or lightheadedness with hypotension: Yes Has patient had a PCN reaction causing severe rash involving mucus membranes or skin necrosis: No Has patient had a PCN reaction that required hospitalization No Has patient had a PCN reaction occurring within the last 10 years: Yes If all of the above answers are "NO", then may proceed with Cephalosporin use.     Antimicrobials this admission: 10/20 amphotericin B Lipid >> 10/23 10/24 Amphotericin B deoxycholate >>  Dose adjustments this admission:  Microbiology results: 10/5  UCx: >=100,000 COLONIES/mL CANDIDA PARAPSILOSIS   Thank you for allowing pharmacy  to be a part of this patient's care.  Gardner CandleSheema M Abbigail Anstey, PharmD, BCPS Clinical Pharmacist 02/02/2017 4:20 PM

## 2017-02-02 NOTE — Progress Notes (Signed)
MEDICATION RELATED CONSULT NOTE - INITIAL   Pharmacy Consult for electrolyte monitoring while on amphotericin  Allergies  Allergen Reactions  . Ambien [Zolpidem Tartrate] Other (See Comments)    Reaction: altered mental status  . Amoxicillin Other (See Comments)    Reaction: blisters in mouth Has patient had a PCN reaction causing immediate rash, facial/tongue/throat swelling, SOB or lightheadedness with hypotension: Yes Has patient had a PCN reaction causing severe rash involving mucus membranes or skin necrosis: No Has patient had a PCN reaction that required hospitalization No Has patient had a PCN reaction occurring within the last 10 years: Yes If all of the above answers are "NO", then may proceed with Cephalosporin use.     Patient Measurements: Height: 5\' 10"  (177.8 cm) Weight: 184 lb (83.5 kg) IBW/kg (Calculated) : 68.5  Intake/Output from previous day: 10/22 0701 - 10/23 0700 In: 2167.3 [P.O.:360; I.V.:334; IV Piggyback:1388.3] Out: 400 [Urine:400] Intake/Output from this shift: Total I/O In: 1500 [IV Piggyback:1500] Out: -   Labs:  Recent Labs  01/31/17 0347 02/01/17 0403 02/02/17 0504  WBC  --   --  5.0  HGB  --   --  10.5*  HCT  --   --  33.7*  PLT  --   --  264  CREATININE 1.18* 0.83 1.29*  MG 2.2 2.2 2.2  PHOS  --   --  5.5*  ALBUMIN  --   --  2.0*  PROT  --   --  5.9*  AST  --   --  20  ALT  --   --  14  ALKPHOS  --   --  102  BILITOT  --   --  0.6  BILIDIR  --   --  0.2  IBILI  --   --  0.4   Lab Results  Component Value Date   K 3.9 02/02/2017    Estimated Creatinine Clearance: 48.4 mL/min (A) (by C-G formula based on SCr of 1.29 mg/dL (H)).  Assessment: Pharmacy monitoring electrolytes (specifically K and Mg) in this 71 year old female while on amphotericin for UTI.  Goal of Therapy: Electrolytes WNL  Plan:  K = 3.9, Mg = 2.2. No supplementation needed at this time. Will recheck with AM labs tomorrow.  Gardner CandleSheema M Reegan Mctighe, PharmD,  BCPS Clinical Pharmacist 02/02/2017,4:15 PM

## 2017-02-02 NOTE — Progress Notes (Signed)
OT Cancellation Note  Patient Details Name: Duard Bradyhyllis W Fryer MRN: [id number] DOB: 1946/02/12   Cancelled Treatment:    Reason Eval/Treat Not Completed: Fatigue/lethargy limiting ability to participate. Chart reviewed. Noted R ureteroscopy to remove stones planned for Wed 10/23. Upon attempt, pt sleeping soundly, son and spouse in room, agree that pt is unable to participate this date in therapy due to fatigue/lethargy and request OT attempt next date. Will re-attempt OT treatment next date as pt is medically appropriate.   Richrd PrimeJamie Stiller, MPH, MS, OTR/L ascom [phone] 02/02/17, 4:16 PM

## 2017-02-02 NOTE — Progress Notes (Signed)
Dr. Anne HahnWillis at bedside, pt's husband and son at bedside. BP=88/40, O2 sats 86% on 3Lpm, CBG=95. Pt very lethargic. Placed on BIPAP by Respiratory Therapist. Dr Anne HahnWillis ordered for transfer to unit.

## 2017-02-02 NOTE — Progress Notes (Signed)
Called by nursing to evaluate the patient after she became significantly more somnolent and had a drop in her oxygen saturation is also blood pressure. Patient did get trazodone this evening which could be contributed somewhat to her sedative effect, but on evaluation she had significant rales bilaterally and very much diminished breath sounds over the left lung. Chest x-ray shows significantly increased pleural effusions, much larger on the left side, as well as significant pulmonary edema. Given the patient's low blood pressure we are unable to diuresis at this time. ABG revealed hypercapnia causing acidosis. BiPAP ordered and patient transferred to stepdown unit. Ultrasound-guided thoracentesis ordered for tomorrow.  Kristeen MissWILLIS, Ohanna Gassert FIELDING [facility] 02/03/2017, 2:24 AM

## 2017-02-02 NOTE — Progress Notes (Addendum)
Pt is very lethargic, opens eyes to voice but drifts off right away. BP=91/48, O2 sats 91% at 2Lpm nasal cannula. Night time medicine given in applesauce, pt fairly tolerated. Metoprolol held. Pt shows some effort in breathing, O2 inhalation increased to 3Lpm. Lung sounds diminished bilaterally, coarse crackles on the left base. Dr Anne HahnWillis paged and made aware, ordered for STAT portable chest X-ray. Will continue to closely monitor.

## 2017-02-02 NOTE — Consult Note (Signed)
Name: ELLENA KAMEN MRN: [id number] DOB: 1945/04/25    ADMISSION DATE:  01/15/2017  CONSULTATION DATE: 02/02/17  REFERRING MD : Dr. Anne Hahn  CHIEF COMPLAINT:  lethargy  BRIEF PATIENT DESCRIPTION: 71 year old female with multiple co-morbidities transferred from the floor due to increased lethargy related to Acute hypercarbic/hypoxic respiratory failure requiring BiPAP.  SIGNIFICANT EVENTS  10/23>> Patient transferred to the SDU with Acute hypoxic/hypercarbic respiratory failure  Related to bilateral pleural effusion requiring BiPAP  STUDIES:  01/16/17 ECHO>>Left ventricle: The cavity size was normal. Wall thickness was   increased in a pattern of mild LVH. Systolic function was normal.  The estimated ejection fraction was in the range of 55% to 60%. 10/15 Renal Ultrasound>>Mild right-sided hydronephrosis with multiple stones. No definite left-sided stones.  HISTORY OF PRESENT ILLNESS: Mckinzy Fuller is a 71 year old female with multiple  Co-morbidities such as hypertension,Hyperlipidemia,morbid obesity,spinal stenosis,UTI and pressure ulcers. Patient was admitted on 10/5 with altered mental status , UTI, Pneumonia and left sided pleural effussion.  Patient had thoracentesis done on 10/8 and they removed 375 ml of amber colored pleural fluid.  On 10/23  Patient was transferred from the floor due to increased lethargy  And respiratory distress requiring BiPAP  PAST MEDICAL HISTORY :   has a past medical history of Chronic pain; Depression; HLD (hyperlipidemia); HTN (hypertension); Hypothyroidism; Morbid obesity (HCC); Recurrent UTI; Spinal stenosis; and Visual hallucinations.  has a past surgical history that includes Back surgery; Abdominal hysterectomy; and Hemorroidectomy. Prior to Admission medications   Medication Sig Start Date End Date Taking? Authorizing Provider  acetaminophen (TYLENOL) 325 MG tablet Take 650 mg by mouth every 6 (six) hours as needed for mild pain.   Yes  [provider]  albuterol (PROVENTIL HFA;VENTOLIN HFA) 108 (90 Base) MCG/ACT inhaler Inhale 2 puffs into the lungs every 4 (four) hours as needed for wheezing or shortness of breath.   Yes [provider]  albuterol (PROVENTIL) (2.5 MG/3ML) 0.083% nebulizer solution Take 2.5 mg by nebulization every 6 (six) hours as needed for wheezing or shortness of breath.   Yes [provider]  amlodipine-atorvastatin (CADUET) 2.5-10 MG tablet Take 1 tablet by mouth daily.   Yes [provider]  ARIPiprazole (ABILIFY) 2 MG tablet Take 2 mg by mouth daily. 08/27/16  Yes [provider]  aspirin EC 81 MG tablet Take 81 mg by mouth daily.   Yes [provider]  baclofen (LIORESAL) 20 MG tablet Take 5-20 mg by mouth 3 (three) times daily. 10 mg every morning and 20 mg every evening   Yes [provider]  docusate sodium (COLACE) 100 MG capsule Take 100 mg by mouth 2 (two) times daily.   Yes [provider]  fluconazole (DIFLUCAN) 100 MG tablet Take 100 mg by mouth once a week.   Yes [provider]  fluticasone (FLONASE) 50 MCG/ACT nasal spray Place 2 sprays into both nostrils daily as needed. For congestion   Yes [provider]  furosemide (LASIX) 20 MG tablet Take 20 mg by mouth daily.   Yes [provider]  levothyroxine (SYNTHROID, LEVOTHROID) 150 MCG tablet Take 150 mcg by mouth daily before breakfast.   Yes [provider]  mupirocin ointment (BACTROBAN) 2 % Place 1 application into the nose 2 (two) times daily. Apply to open skin on toes   Yes [provider]  nystatin cream (MYCOSTATIN) Apply 1 application topically 2 (two) times daily. Apply to rash in skin folds  Yes [provider]  polyethylene glycol (MIRALAX / GLYCOLAX) packet Take 17 g by mouth daily.   Yes [provider]  potassium chloride SA (K-DUR,KLOR-CON) 20 MEQ tablet Take 30 mEq by mouth daily.    Yes  [provider]  pregabalin (LYRICA) 150 MG capsule Take 150 mg by mouth 3 (three) times daily.   Yes [provider]  senna-docusate (SENOKOT-S) 8.6-50 MG tablet Take 2 tablets by mouth daily.   Yes [provider]  Simethicone 180 MG CAPS Take 2 capsules by mouth 3 (three) times daily.   Yes [provider]  traZODone (DESYREL) 100 MG tablet Take 50 mg by mouth at bedtime.    Yes [provider]  triamcinolone ointment (KENALOG) 0.1 % Apply 1 application topically 2 (two) times daily.   Yes [provider]  Venlafaxine HCl 225 MG TB24 Take 225 mg by mouth daily.   Yes [provider]  vitamin B-12 (CYANOCOBALAMIN) 1000 MCG tablet Take 1,000 mcg by mouth daily.   Yes [provider]  Vitamin D, Ergocalciferol, (DRISDOL) 50000 units CAPS capsule Take 50,000 Units by mouth every 30 (thirty) days.   Yes [provider]  Zinc Oxide 13 % CREA Apply 1 application topically as needed for irritation.   Yes [provider]  hydrocortisone (ANUSOL-HC) 2.5 % rectal cream Place 1 application rectally at bedtime as needed for hemorrhoids or itching.    [provider]  penciclovir (DENAVIR) 1 % cream Apply 1 application topically 5 (five) times daily as needed. Apply to lip for lesions    [provider]   Allergies  Allergen Reactions  . Ambien [Zolpidem Tartrate] Other (See Comments)    Reaction: altered mental status  . Amoxicillin Other (See Comments)    Reaction: blisters in mouth Has patient had a PCN reaction causing immediate rash, facial/tongue/throat swelling, SOB or lightheadedness with hypotension: Yes Has patient had a PCN reaction causing severe rash involving mucus membranes or skin necrosis: No Has patient had a PCN reaction that required hospitalization No Has patient had a PCN reaction occurring within the last 10 years: Yes If all of the above answers are "NO", then may proceed  with Cephalosporin use.     FAMILY HISTORY:  family history includes Deep vein thrombosis in her father; Heart disease in her father. SOCIAL HISTORY:  reports that she has never smoked. She has quit using smokeless tobacco. Her smokeless tobacco use included Snuff. She reports that she does not drink alcohol or use drugs.  REVIEW OF SYSTEMS:   Constitutional: Negative for fever, chills, weight loss, malaise/fatigue and diaphoresis.  HENT: Negative for hearing loss, ear pain, nosebleeds, congestion, sore throat, neck pain, tinnitus and ear discharge.   Eyes: Negative for blurred vision, double vision, photophobia, pain, discharge and redness.  Respiratory: Negative for cough, hemoptysis, sputum production, shortness of breath, wheezing and stridor.   Cardiovascular: Negative for chest pain, palpitations, orthopnea, claudication, leg swelling and PND.  Gastrointestinal: Negative for heartburn, nausea, vomiting, abdominal pain, diarrhea, constipation, blood in stool and melena.  Genitourinary: Negative for dysuria, urgency, frequency, hematuria and flank pain.  Musculoskeletal: Negative for myalgias, back pain, joint pain and falls.  Skin: Negative for itching and rash.  Neurological: Negative for dizziness, tingling, tremors, sensory change, speech change, focal weakness, seizures, loss of consciousness, weakness and headaches.  Endo/Heme/Allergies: Negative for environmental allergies and polydipsia. Does not bruise/bleed easily.  SUBJECTIVE: Patient is very lethargic at time.On BiPAP  VITAL SIGNS:  Temp:  [96.3 F (35.7 C)-97.6 F (36.4 C)] 97.1 F (36.2 C) (10/23 2056) Pulse Rate:  [69-88] 69 (10/23 2222) Resp:  [16-19] 18 (10/23 2056) BP: (88-120)/(40-98) 88/40 (10/23 2222) SpO2:  [87 %-97 %] 91 % (10/23 2056) Weight:  [184 lb (83.5 kg)] 184 lb (83.5 kg) (10/23 0331)  PHYSICAL EXAMINATION: General:  71 yo ,Caucasian female, on Bipap Neuro: very drowsy and lethargic HEENT:   AT,[place],No jvd Cardiovascular:  S1S2,SR,no m/r/g Lungs: diminished throughout, no wheezes,crackles or rhonchi noted Abdomen:  Obese,NT,ND Musculoskeletal:  No edema,cyanosis noted Skin: pressure ulcer present,warm and dry.   Recent Labs Lab 01/31/17 0347 02/01/17 0403 02/02/17 0504  NA 136 137 137  K 4.1 3.8 3.9  CL 99* 103 101  CO2 28 30 31   BUN 16 15 17   CREATININE 1.18* 0.83 1.29*  GLUCOSE 84 90 88    Recent Labs Lab 01/27/17 0417 01/29/17 0453 02/02/17 0504  HGB 10.9* 12.0 10.5*  HCT 33.6* 36.2 33.7*  WBC 8.7 8.9 5.0  PLT 292 320 264   Dg Chest Port 1 View  Result Date: 02/02/2017 CLINICAL DATA:  Oxygen desaturation EXAM: PORTABLE CHEST 1 VIEW COMPARISON:  01/27/2017 FINDINGS: Moderate bilateral pleural effusions. Cardiomegaly with vascular congestion and underlying pulmonary edema. Bibasilar consolidations. IMPRESSION: 1. Moderate bilateral pleural effusions, increased compared to prior radiograph 2. Cardiomegaly with vascular congestion and pulmonary edema. 3. Bibasilar atelectasis or pneumonia, increased compared to prior Electronically Signed   By: Jasmine PangKim  Fujinaga M.D.   On: 02/02/2017 22:32    ASSESSMENT / PLAN: Acute Hypoxic/hypercarbic respiratory failure Pulmonary Edema Large Pleural effusion L>R Bibasilar Atelectasis Pneumonia Septic Shock Recurrent UTI  With yeast Bilateral hydronephrosis with bladder wall thickeningand urge incontinence possibly related to a neurologic cause from her spinal stenosis    Plan Continue BiPAP, wean as tolerated High risk for intubation Repeat ABG in an hour Continue Neo synephrine to keep MAP>60 Hold diuresis for now as the patient is hypotensive Ultra sounded guided thoracentesis in am ID/urology following the patient Hold metoprolol Continue Amphotericin-B per ID recommendation    Bincy Varughese,AG-ACNP Pulmonary and Critical Care Medicine [facility], [facility]   02/02/2017, 10:52 PM   Billy Fischeravid Simonds,  MD PCCM service Mobile [phone] Pager [phone] 02/03/2017 3:14 PM

## 2017-02-02 NOTE — Progress Notes (Signed)
Patient very lethargic today.  Lyrica was discontinued to see if this makes a difference.    Family requesting wound nurse to be reconsulted since we are unable to obtain the medications she recommended.    Antibiotics were held today since Dr. Sampson GoonFitzgerald was concerned about her kidney function.   2L of fluid given.

## 2017-02-02 NOTE — Progress Notes (Addendum)
[facility] [place] [facility]   [name]    MR#:  [id number]  DATE OF BIRTH:  14-Dec-1945  SUBJECTIVE:   The patient is confused and not as alert as yesterday her husband. No agitation per RN. REVIEW OF SYSTEMS:    Review of Systems  He patient is confused, unable to get ROS.  DRUG ALLERGIES:   Allergies  Allergen Reactions  . Ambien [Zolpidem Tartrate] Other (See Comments)    Reaction: altered mental status  . Amoxicillin Other (See Comments)    Reaction: blisters in mouth Has patient had a PCN reaction causing immediate rash, facial/tongue/throat swelling, SOB or lightheadedness with hypotension: Yes Has patient had a PCN reaction causing severe rash involving mucus membranes or skin necrosis: No Has patient had a PCN reaction that required hospitalization No Has patient had a PCN reaction occurring within the last 10 years: Yes If all of the above answers are "NO", then may proceed with Cephalosporin use.     VITALS:  Blood pressure (!) 108/51, pulse 87, temperature 97.6 F (36.4 C), temperature source Oral, resp. rate 19, height 5\' 10"  (1.778 m), weight 184 lb (83.5 kg), SpO2 97 %.  PHYSICAL EXAMINATION:  Constitutional: Appears obese No distress. Morbid obesity. HENT: Normocephalic.   Eyes: Conjunctivae and EOM are normal. PERRLA, no scleral icterus.  Neck: Normal ROM. Neck supple. No JVD. No tracheal deviation. CVS: RRR, S1/S2 +, no murmurs,  Pulmonary: Effort and breath sounds normal, no stridor, rhonchi, wheezes, rales.  Abdominal: Soft. BS +,  no distension, tenderness, rebound or guarding.  Musculoskeletal: No cyanosis or leg edema.  Neuro: the patient is awake but confused, unable to exam. Skin: Skin is warm and dry. No rash noted. Sacral stage 1 Psychiatric: flat depressed affect and confused.  LABORATORY PANEL:   CBC  Recent Labs Lab 02/02/17 0504  WBC 5.0  HGB 10.5*  HCT 33.7*  PLT 264    ------------------------------------------------------------------------------------------------------------------  Chemistries   Recent Labs Lab 02/02/17 0504  NA 137  K 3.9  CL 101  CO2 31  GLUCOSE 88  BUN 17  CREATININE 1.29*  CALCIUM 8.2*  MG 2.2  AST 20  ALT 14  ALKPHOS 102  BILITOT 0.6   ------------------------------------------------------------------------------------------------------------------  Cardiac Enzymes No results for input(s): TROPONINI in the last 168 hours. ------------------------------------------------------------------------------------------------------------------  RADIOLOGY:  No results found.  ASSESSMENT AND PLAN:   71 year old female with a history of depression and chronically bed bound due to spinal stenosis who presented October 5 due to altered mental status.  1.Acute metabolic encephalopathy in the setting of pneumonia and severe depression Patient seen and evaluated by neurology. CT head is negative All sedative medications on hold due to encephalopathy.  MRI brain was negative for acute CVA   As per son, pt was on trazodone before admit, which was restarted.   Due to agitation started haldol injection PRN. Since patient is more confused today, I discussed with the patient husband, who agreed to hold Lyrica but to continue trazodone.  2. HCAP with pleural effusion status post thoracentesis Patient has completed treatment with vancomycin and cefepime ID consultation is appreciated.  3. Bilateral hydronephrosis: As per urology patient,this is probably a chronic issue. Continue Foley catheter at this time Repeat ultrasound shows mild hydronephrosis on the right and resolved on the left Will need foley until urology follow up in 2-3 weeks after discharge.  4.recurrent urinary tract infections: Urine culture positive for Candida sent to lab cor for  sensitivities. cx resulted back, ID suggested to give amphoterecin B and  pharmacy to manage labs. Continue amphotericin. Will plan 5-7 day course, continue management of stones and neurogenic bladder by urology per Dr. Sampson GoonFitzgerald. Plan for right ureteroscopy to remove stones as possible nidus per Dr. Apolinar JunesBrandon. Normal saline 1L before amphoterecin B due to increased creatinine per Dr. Sampson GoonFitzgerald. Follow up BMP.  5. Severe depression: Patient is being followed by psychiatry Due to severe lethargy All sedative medications including those for depression have been discontinued. Family does not want psychiatrist to see patient any more. Dr. Elisabeth PigeonVachhani asked them again, as pt was more agitated, but they refused, so will manage currently with haldol.   6. Hypokalemia:  Continue daily Potassium at home, resumed KCl daily. Improved.  Hypomagnesemia. Given IV magnesium and improved.  7. Pericardial effusion: Patient has been seen and evaluated by cardiology.Echocardiogram shows moderate  pericardial effusion. Patient has no signs of tamponade Patient will need outpatient follow up with Dr Welton FlakesKhan to Reassess pericardial effusion. At present this is asymptomatic. Stable per Dr. Welton FlakesKhan.  8. Essential hypertension: Continue Norvasc   Added metoprolol due to tachycardia.  9. Hyperlipidemia: Continue statin  10. Sacral DU stage 1, Present on admission: Cleanse sacral and buttocks area with soap and water.  Apply Gerhardt's butt cream twice daily.  No briefs or disposable underpads while in bed.   Low air loss bed.  Continue wound care.  11. NSVT: heart rate controlled currently Cardiology following- added metoprolol. Hold Lopressor if blood pressure is low.  12.  Renal Mass: Will need outpatient UROLOGY follow up.  And discussed with Dr. Sampson GoonFitzgerald. Management plans discussed with the patient's husband and he is in agreement. CODE STATUS: FULL  TOTAL TIME TAKING CARE OF THIS PATIENT: 35 minutes.   POSSIBLE D/C 3 days home with HH , DEPENDING ON CLINICAL  CONDITION.   Shaune Pollackhen, Zuma Hust M.D on 01/26/2017 at 2:20 PM  Between 7am to 6pm - Pager - [phone] After 6pm go to [email or web address] - Social research officer, governmentpassword [email or web address]  [facility]  Office  [phone]  CC: Primary care physician; Center, [facility]  Note: This dictation was prepared with Nurse, children'sDragon dictation along with smaller phrase technology. Any transcriptional errors that result from this process are unintentional.

## 2017-02-02 NOTE — Progress Notes (Addendum)
[facility] CLINIC INFECTIOUS DISEASE PROGRESS NOTE Date of Admission:  01/15/2017     ID: Jessica [name] is a 71 y.o. female with  Candiduria Principal Problem:   Severe recurrent major depression with psychotic features (HCC) Active Problems:   Pneumonia   Subacute delirium   Altered mental status   Right nephrolithiasis   Subjective: Less alert today per husband  ROS  Unable to obtain  Medications:  Antibiotics Given (last 72 hours)    None     . acetaminophen  650 mg Oral Q24H  . amLODipine  2.5 mg Oral Daily   And  . atorvastatin  10 mg Oral Daily  . aspirin EC  81 mg Oral Daily  . enoxaparin (LOVENOX) injection  40 mg Subcutaneous Q24H  . feeding supplement (ENSURE ENLIVE)  237 mL Oral BID BM  . furosemide  40 mg Oral Daily  . Gerhardt's butt cream   Topical BID  . haloperidol lactate  2 mg Intravenous Once  . levothyroxine  150 mcg Oral QAC breakfast  . metoprolol tartrate  25 mg Oral BID  . mupirocin ointment  1 application Nasal BID  . polyethylene glycol  17 g Oral Daily  . potassium chloride  30 mEq Oral Daily  . pregabalin  150 mg Oral TID  . senna-docusate  2 tablet Oral Daily  . traZODone  50 mg Oral QHS  . venlafaxine XR  150 mg Oral Daily  . vitamin B-12  1,000 mcg Oral Daily  . Vitamin D (Ergocalciferol)  50,000 Units Oral Q30 days    Objective: Vital signs in last 24 hours: Temp:  [97.6 F (36.4 C)-97.8 F (36.6 C)] 97.6 F (36.4 C) (10/22 2325) Pulse Rate:  [84-87] 87 (10/22 2325) Resp:  [19] 19 (10/22 2325) BP: (108-110)/(51-55) 108/51 (10/22 2325) SpO2:  [95 %-97 %] 97 % (10/22 2325) Weight:  [83.5 kg (184 lb)-89.8 kg (198 lb)] 83.5 kg (184 lb) (10/23 0331) Constitutional:  lethargic , lying in bed,  HENT: [place]/AT, PERRLA, no scleral icterus Mouth/Throat: Oropharynx is clear and dry. No oropharyngeal exudate.  Cardiovascular: Normal rate, regular rhythm distant Pulmonary/Chest: poor effort, bil rhonchi Neck = supple, no nuchal  rigidity Abdominal: Soft. obese Bowel sounds are normal.  exhibits no distension. There is no tenderness.  Lymphadenopathy: no cervical adenopathy. No axillary adenopathy Neurological: sleepy appearing but able to interact  Skin: Skin is warm and dry. No rash noted. No erythema.  Psychiatric:flat affect Lab Results  Recent Labs  02/01/17 0403 02/02/17 0504  WBC  --  5.0  HGB  --  10.5*  HCT  --  33.7*  NA 137 137  K 3.8 3.9  CL 103 101  CO2 30 31  BUN 15 17  CREATININE 0.83 1.29*   Microbiology: Results for orders placed or performed during the hospital encounter of 01/15/17  Blood culture (routine x 2)     Status: None   Collection Time: 01/15/17  4:04 PM  Result Value Ref Range Status   Specimen Description BLOOD BLOOD LEFT ARM  Final   Special Requests   Final    BOTTLES DRAWN AEROBIC AND ANAEROBIC Blood Culture adequate volume   Culture NO GROWTH 5 DAYS  Final   Report Status 01/20/2017 FINAL  Final  Blood culture (routine x 2)     Status: None   Collection Time: 01/15/17  4:05 PM  Result Value Ref Range Status   Specimen Description BLOOD LEFT ANTECUBITAL  Final   Special Requests  Final    BOTTLES DRAWN AEROBIC AND ANAEROBIC Blood Culture adequate volume   Culture NO GROWTH 5 DAYS  Final   Report Status 01/20/2017 FINAL  Final  Urine Culture     Status: Abnormal   Collection Time: 01/15/17  8:51 PM  Result Value Ref Range Status   Specimen Description URINE, CATHETERIZED  Final   Special Requests NONE  Final   Culture (A)  Final    >=100,000 COLONIES/mL CANDIDA PARAPSILOSIS SEE SEPARATE REPORT FOR SUSCEPTIBILITIES Performed at [facility], [address] [place], [place] [address]    Report Status 01/28/2017 FINAL  Final  Body fluid culture     Status: None   Collection Time: 01/18/17  9:10 AM  Result Value Ref Range Status   Specimen Description PLEURAL  Final   Special Requests NONE  Final   Gram Stain   Final    ABUNDANT WBC PRESENT,BOTH PMN  AND MONONUCLEAR NO ORGANISMS SEEN    Culture   Final    NO GROWTH 3 DAYS Performed at [facility], [address] [place], [place] [address]    Report Status 01/22/2017 FINAL  Final  MRSA PCR Screening     Status: None   Collection Time: 01/18/17  2:15 PM  Result Value Ref Range Status   MRSA by PCR NEGATIVE NEGATIVE Final    Comment:        The GeneXpert MRSA Assay (FDA approved for NASAL specimens only), is one component of a comprehensive MRSA colonization surveillance program. It is not intended to diagnose MRSA infection nor to guide or monitor treatment for MRSA infections.   Urine Culture     Status: Abnormal   Collection Time: 01/20/17 10:52 PM  Result Value Ref Range Status   Specimen Description URINE, RANDOM  Final   Special Requests Normal  Final   Culture 30,000 COLONIES/mL YEAST (A)  Final   Report Status 01/22/2017 FINAL  Final  Urine Culture     Status: Abnormal   Collection Time: 01/28/17  6:28 PM  Result Value Ref Range Status   Specimen Description URINE, RANDOM  Final   Special Requests NONE  Final   Culture 40,000 COLONIES/mL YEAST (A)  Final   Report Status 01/29/2017 FINAL  Final    Studies/Results: No results found.  Assessment/Plan: Jessica [name] is a 71 y.o. female admitted with AMS and found to have pericardial and pleural effusion as well as PNA.  She also has had recurrent UTIs with yeast for about 6 months. Candiduria is unusual in patients without indwelling urinary caths however she has had multiple positive cultures with pyuria noted on UA.  CT did reveal stones and urology had foley placed for possible neurogenic bladder Cx reveals candida parapsilosis. Sensitivities reviewed and reveal resistant to fluconazole but sensitive to amphotericin. Intermediate to voriconazole. Echinocandins do not treat UTIs.  Cr has bumped. Decreased UOP.   Recommendations Since cr increased will hold amphotericin today, give fluids and  recheck cr tomorrow. Continue management of stones and neurogenic bladder by urology. For cysto tomorrow. Increase fluids today to 1 L NS pre and then 1 L at 150 cc hr. Monitor resp status. Hold lasix Thank you very much for the consult. Will follow with you.  Mick SellDavid P [place]   02/02/2017, 1:16 PM

## 2017-02-02 NOTE — Consult Note (Signed)
WOC Nurse wound consult note Reason for Consult: sacrum "ran out of medicine" Wound type: Unstageable Pressure injury: sacrum: 7cm x 5cm x 0.1cm  New areas Stage 1 left ear New area Stage 2 right ear  Wound bed: 70% black, soft over the sacrum/ 30% pink at wound edges Partial thickness opening right ear, did not measure Non blanchable redness left ear, did not measure  Drainage (amount, consistency, odor) none at the ears, minimal, no odor over the sacral wound Periwound: intact  Dressing procedure/placement/frequency: DC gerhardts  Add enzymatic debridement ointment to the sacrum Maximize nutrition, add RD for evaluation for wound healing LALM in place for moisture management and pressure redistribution Educated patient's son to limit time up in chair to less than 2 hours per day. Continue Prevalon boots for offloading vunerable heels.  Discussed POC with patient and bedside nurse.  Re consult if needed, will not follow at this time. Thanks  Kenyon Eichelberger M.D.[employer] MSN, RN,CWOCN, CNS, CWON-AP [phone])

## 2017-02-02 NOTE — Progress Notes (Signed)
CH responded to a PG for room 158. Pt was being fed by the husband. Son wanted time to speak. [place] secured a room for a conversation. Kim spent time listening to the Son who stated he wanted to "vent" his frustrations. The conversation covered the death of his dog, the treatment of his mother, and what appeared an embarrassing moment concerning a statement he made about wishing he could die. Pt feels this was taken too seriously as security and the police became involved. Son stated that this was his way of grieving and now feels he is unable to do that here. [place] inquired about his self care, of which the son admitted he is not doing all that he should. CH provided the ministry empathetic listening. Son stated that he would like to speak with [place] of whom he has met with several time. [place] will inform CH Fred at his next rounding.     02/02/17 1200  Clinical Encounter Type  Visited With Family  Visit Type Follow-up  Referral From Nurse  Consult/Referral To Chaplain  Spiritual Encounters  Spiritual Needs Emotional;Grief support

## 2017-02-02 NOTE — Progress Notes (Signed)
Chaplain made a follow-up visit with pt. Pt and son were sleeping at the time of this visit. CH was not able to have a consult with pt and son. CH to follow-up pt as needed.    02/02/17 1600  Clinical Encounter Type  Visited With Patient;Patient and family together  Visit Type Follow-up;Spiritual support;Other (Comment)  Referral From Nurse  Consult/Referral To Chaplain  Spiritual Encounters  Spiritual Needs Prayer;Emotional;Other (Comment)

## 2017-02-03 ENCOUNTER — Encounter
Admission: EM | Disposition: A | Discharge status: Home / Self Care | Payer: Self-pay | Source: Home / Self Care | Attending: Internal Medicine

## 2017-02-03 ENCOUNTER — Inpatient Hospital Stay: Payer: Medicare Other

## 2017-02-03 DIAGNOSIS — R400 Somnolence: Secondary | ICD-10-CM

## 2017-02-03 DIAGNOSIS — N179 Acute kidney failure, unspecified: Secondary | ICD-10-CM

## 2017-02-03 DIAGNOSIS — R34 Anuria and oliguria: Secondary | ICD-10-CM

## 2017-02-03 DIAGNOSIS — J9612 Chronic respiratory failure with hypercapnia: Secondary | ICD-10-CM

## 2017-02-03 DIAGNOSIS — J90 Pleural effusion, not elsewhere classified: Secondary | ICD-10-CM

## 2017-02-03 LAB — BLOOD GAS, ARTERIAL
Acid-Base Excess: 0.1 mmol/L (ref 0.0–2.0)
Acid-Base Excess: 0.5 mmol/L (ref 0.0–2.0)
Bicarbonate: 26.9 mmol/L (ref 20.0–28.0)
Bicarbonate: 29.9 mmol/L — ABNORMAL HIGH (ref 20.0–28.0)
Expiratory PAP: 8
FIO2: 0.6
FIO2: 0.7
Inspiratory PAP: 16
MECHVT: 500 mL
Mechanical Rate: 15
Mode: POSITIVE
O2 Saturation: 88.1 %
O2 Saturation: 92.8 %
PEEP: 5 cmH2O
Patient temperature: 37
Patient temperature: 37
pCO2 arterial: 51 mmHg — ABNORMAL HIGH (ref 32.0–48.0)
pCO2 arterial: 80 mmHg (ref 32.0–48.0)
pH, Arterial: 7.18 — CL (ref 7.350–7.450)
pH, Arterial: 7.33 — ABNORMAL LOW (ref 7.350–7.450)
pO2, Arterial: 59 mmHg — ABNORMAL LOW (ref 83.0–108.0)
pO2, Arterial: 82 mmHg — ABNORMAL LOW (ref 83.0–108.0)

## 2017-02-03 LAB — BASIC METABOLIC PANEL WITH GFR
Anion gap: 8 (ref 5–15)
BUN: 15 mg/dL (ref 6–20)
CO2: 22 mmol/L (ref 22–32)
Calcium: 6.3 mg/dL — CL (ref 8.9–10.3)
Chloride: 111 mmol/L (ref 101–111)
Creatinine, Ser: 1.37 mg/dL — ABNORMAL HIGH (ref 0.44–1.00)
GFR calc Af Amer: 44 mL/min — ABNORMAL LOW
GFR calc non Af Amer: 38 mL/min — ABNORMAL LOW
Glucose, Bld: 61 mg/dL — ABNORMAL LOW (ref 65–99)
Potassium: 3.1 mmol/L — ABNORMAL LOW (ref 3.5–5.1)
Sodium: 141 mmol/L (ref 135–145)

## 2017-02-03 LAB — GLUCOSE, CAPILLARY
Glucose-Capillary: 105 mg/dL — ABNORMAL HIGH (ref 65–99)
Glucose-Capillary: 114 mg/dL — ABNORMAL HIGH (ref 65–99)

## 2017-02-03 LAB — MRSA PCR SCREENING: MRSA by PCR: NEGATIVE

## 2017-02-03 LAB — MAGNESIUM: Magnesium: 1.6 mg/dL — ABNORMAL LOW (ref 1.7–2.4)

## 2017-02-03 SURGERY — CYSTOSCOPY/URETEROSCOPY/HOLMIUM LASER/STENT PLACEMENT
Anesthesia: Choice | Laterality: Right

## 2017-02-03 MED ORDER — FUROSEMIDE 20 MG PO TABS: 40.0000 mg | ORAL_TABLET | Freq: Every day | ORAL | Status: DC

## 2017-02-03 MED ORDER — ASPIRIN 81 MG PO CHEW
81.0000 mg | CHEWABLE_TABLET | Freq: Once | ORAL | Status: AC
  Administered 2017-02-03: 81 mg
  Filled 2017-02-03: qty 1

## 2017-02-03 MED ORDER — SODIUM CHLORIDE 0.9% FLUSH: 10.0000 mL | INTRAVENOUS | Status: DC | PRN

## 2017-02-03 MED ORDER — FAMOTIDINE IN NACL 20-0.9 MG/50ML-% IV SOLN
20.0000 mg | Freq: Every day | INTRAVENOUS | Status: AC
  Administered 2017-02-03 – 2017-02-04 (×2): 20 mg via INTRAVENOUS
  Filled 2017-02-03 (×2): qty 50

## 2017-02-03 MED ORDER — POTASSIUM CHLORIDE 10 MEQ/100ML IV SOLN
10.0000 meq | INTRAVENOUS | Status: DC
  Administered 2017-02-03 (×3): 10 meq via INTRAVENOUS
  Filled 2017-02-03 (×3): qty 100

## 2017-02-03 MED ORDER — ACETAMINOPHEN 650 MG RE SUPP
650.0000 mg | Freq: Four times a day (QID) | RECTAL | Status: DC | PRN
Start: 2017-02-03 — End: 2017-02-03

## 2017-02-03 MED ORDER — POTASSIUM CHLORIDE CRYS ER 20 MEQ PO TBCR: 40.0000 meq | EXTENDED_RELEASE_TABLET | ORAL | Status: DC

## 2017-02-03 MED ORDER — ETOMIDATE 2 MG/ML IV SOLN
INTRAVENOUS | Status: AC
  Administered 2017-02-03: 20 mg
  Filled 2017-02-03: qty 10

## 2017-02-03 MED ORDER — LACTATED RINGERS IV SOLN
INTRAVENOUS | Status: DC
  Administered 2017-02-03 – 2017-02-04 (×2): via INTRAVENOUS
  Administered 2017-02-04: 75 mL/h via INTRAVENOUS

## 2017-02-03 MED ORDER — SODIUM CHLORIDE 0.9 % IV SOLN
0.0000 ug/min | INTRAVENOUS | Status: DC
  Administered 2017-02-03: 100 ug/min via INTRAVENOUS
  Administered 2017-02-03: 75 ug/min via INTRAVENOUS
  Administered 2017-02-04: 10 ug/min via INTRAVENOUS
  Administered 2017-02-04: 35 ug/min via INTRAVENOUS
  Administered 2017-02-04: 45 ug/min via INTRAVENOUS
  Administered 2017-02-04: 20 ug/min via INTRAVENOUS
  Filled 2017-02-03 (×3): qty 4

## 2017-02-03 MED ORDER — LEVOTHYROXINE SODIUM 100 MCG PO TABS
150.0000 ug | ORAL_TABLET | Freq: Every day | ORAL | Status: DC
  Administered 2017-02-03: 07:00:00 150 ug
  Filled 2017-02-03: qty 1

## 2017-02-03 MED ORDER — POTASSIUM CHLORIDE 20 MEQ PO PACK
40.0000 meq | PACK | Freq: Once | ORAL | Status: AC
  Administered 2017-02-03: 40 meq via ORAL
  Filled 2017-02-03: qty 2

## 2017-02-03 MED ORDER — TRAZODONE HCL 50 MG PO TABS: 50.0000 mg | ORAL_TABLET | Freq: Every day | ORAL | Status: DC

## 2017-02-03 MED ORDER — MAGNESIUM SULFATE 4 GM/100ML IV SOLN
4.0000 g | Freq: Once | INTRAVENOUS | Status: AC
  Administered 2017-02-03: 4 g via INTRAVENOUS
  Filled 2017-02-03: qty 100

## 2017-02-03 MED ORDER — PRO-STAT SUGAR FREE PO LIQD: 30.0000 mL | Freq: Two times a day (BID) | ORAL | Status: DC

## 2017-02-03 MED ORDER — ACETAMINOPHEN 325 MG PO TABS: 650.0000 mg | ORAL_TABLET | Freq: Every day | ORAL | Status: DC | PRN

## 2017-02-03 MED ORDER — ORAL CARE MOUTH RINSE
15.0000 mL | OROMUCOSAL | Status: DC
  Administered 2017-02-03 – 2017-02-06 (×30): 15 mL via OROMUCOSAL

## 2017-02-03 MED ORDER — CHLORHEXIDINE GLUCONATE 0.12% ORAL RINSE (MEDLINE KIT)
15.0000 mL | Freq: Two times a day (BID) | OROMUCOSAL | Status: DC
  Administered 2017-02-03 – 2017-02-06 (×7): 15 mL via OROMUCOSAL

## 2017-02-03 MED ORDER — ACETAMINOPHEN 325 MG PO TABS: 650.0000 mg | ORAL_TABLET | ORAL | Status: DC

## 2017-02-03 MED ORDER — ROCURONIUM BROMIDE 50 MG/5ML IV SOLN
INTRAVENOUS | Status: AC
  Administered 2017-02-03: 50 mg
  Filled 2017-02-03: qty 1

## 2017-02-03 MED ORDER — FUROSEMIDE 10 MG/ML IJ SOLN
20.0000 mg | Freq: Once | INTRAMUSCULAR | Status: AC
  Administered 2017-02-03: 20 mg via INTRAVENOUS
  Filled 2017-02-03: qty 2

## 2017-02-03 MED ORDER — LEVOTHYROXINE SODIUM 100 MCG IV SOLR
75.0000 ug | Freq: Every day | INTRAVENOUS | Status: DC
  Administered 2017-02-04: 75 ug via INTRAVENOUS
  Filled 2017-02-03 (×2): qty 5

## 2017-02-03 MED ORDER — AMLODIPINE BESYLATE 5 MG PO TABS: 2.5000 mg | ORAL_TABLET | Freq: Every day | ORAL | Status: DC

## 2017-02-03 MED ORDER — ATORVASTATIN CALCIUM 10 MG PO TABS: 10.0000 mg | ORAL_TABLET | Freq: Every day | ORAL | Status: DC

## 2017-02-03 MED ORDER — FENTANYL CITRATE (PF) 100 MCG/2ML IJ SOLN
50.0000 ug | INTRAMUSCULAR | Status: DC | PRN
  Administered 2017-02-03: 50 ug via INTRAVENOUS
  Filled 2017-02-03 (×2): qty 2

## 2017-02-03 MED ORDER — VITAL HIGH PROTEIN PO LIQD: 1000.0000 mL | ORAL | Status: DC

## 2017-02-03 MED ORDER — VITAL AF 1.2 CAL PO LIQD
1000.0000 mL | ORAL | Status: DC
  Administered 2017-02-03 – 2017-02-05 (×4): 1000 mL

## 2017-02-03 MED ORDER — POLYETHYLENE GLYCOL 3350 17 G PO PACK
17.0000 g | PACK | Freq: Every day | ORAL | Status: DC
  Administered 2017-02-03 – 2017-02-05 (×3): 17 g
  Filled 2017-02-03 (×3): qty 1

## 2017-02-03 MED ORDER — FENTANYL CITRATE (PF) 100 MCG/2ML IJ SOLN
50.0000 ug | INTRAMUSCULAR | Status: DC | PRN
  Administered 2017-02-03 – 2017-02-06 (×8): 50 ug via INTRAVENOUS
  Filled 2017-02-03 (×9): qty 2

## 2017-02-03 MED ORDER — ASPIRIN 81 MG PO CHEW
81.0000 mg | CHEWABLE_TABLET | Freq: Every day | ORAL | Status: DC
  Administered 2017-02-03 – 2017-02-05 (×3): 81 mg
  Filled 2017-02-03 (×3): qty 1

## 2017-02-03 MED ORDER — ACETAMINOPHEN 325 MG PO TABS: 650.0000 mg | ORAL_TABLET | Freq: Four times a day (QID) | ORAL | Status: DC | PRN

## 2017-02-03 MED ORDER — SENNOSIDES-DOCUSATE SODIUM 8.6-50 MG PO TABS
2.0000 | ORAL_TABLET | Freq: Every day | ORAL | Status: DC
  Administered 2017-02-03 – 2017-02-05 (×3): 2
  Filled 2017-02-03 (×3): qty 2

## 2017-02-03 MED ORDER — FREE WATER
200.0000 mL | Freq: Three times a day (TID) | Status: DC
  Administered 2017-02-03 – 2017-02-04 (×4): 200 mL

## 2017-02-03 NOTE — Progress Notes (Signed)
Events noted last night. Hemodynamics improving with intubation. Will check echo once done, as not done yet.

## 2017-02-03 NOTE — Progress Notes (Signed)
Nutrition Follow-up  DOCUMENTATION CODES:   Non-severe (moderate) malnutrition in context of chronic illness  INTERVENTION:  Recommend initiating Vital AF 1.2 at 60 ml/hr (1440 ml goal daily volume). Provides 1728 kcal, 108 grams of protein, 1166 ml H2O.  Patient ordered for free water flush of 200 ml Q8hrs. Provides total of 1766 ml H2O including water in tube feeding.  NUTRITION DIAGNOSIS:   Malnutrition (Moderate) related to chronic illness (inadequate inake, pressure ulcers) as evidenced by mild depletion of body fat, mild depletion of muscle mass.  New nutrition diagnosis.  GOAL:   Provide needs based on ASPEN/SCCM guidelines  Addressing with initiation of tube feeds today.  MONITOR:   Vent status, Labs, Weight trends, TF tolerance, Skin, I & O's  REASON FOR ASSESSMENT:   Low [place]    ASSESSMENT:   Jessica [name] has a PMH of HTN, HLD, morbid obesity, spinal stenosis, recurrent UTIs,  Presents with AMS, PNA, pleural effusion, pericardial effusion  -Patient was transferred to ICU for increased lethargy related to acute hypercarbic/hypoxic respiratory failure requiring BiPAP. Patient was intubated overnight.  No family members at bedside at time of assessment. Patient intubated. Patient alert and opened eyes during assessment. Per review of chart patient has not eaten adequately during admission.  Access: OGT placed 02/03/2017; terminates in stomach per abdominal x-ray today; 65 cm at corner of mouth  MAP: 62-82 mmHg (except 50 at 0100 - near time of intubation)  Patient is currently intubated on ventilator support MV: 6.4 L/min Temp (24hrs), Avg:97.7 F (36.5 C), Min:95.9 F (35.5 C), Max:100 F (37.8 C)  Propofol: N/A  Medications reviewed and include: Miralax, potassium chloride 40 mEq once today, senna, Fungizone, magnesium sulfate 4 grams once IV today, phenylephrine gtt (trending down - now at 37.5 ml/hg).  Labs reviewed: Potassium, Glucose 61, Magnesium  1.6, Calcium 6.3 (no albumin today).  Nutrition-Focused physical exam completed. Findings are mild fat depletion (mild depletion of orbital region and upper arm region), mild-moderate muscle depletion (mild depletion of clavicle bone region, clavicle/acromion region, dorsal hand; moderate depletion of temple region, patellar region, anterior thigh region, posterior calf region), and mild pitting edema to bilateral lower extremities. Depletion in lower extremities not very significant given patient is bed-bound.  Weight trend: 88.5 kg on 10/24 (-4.5 kg from admission)  Discussed with RN.  Diet Order:     Skin:  Wound (see comment) (Stg II R ischial tuberosity and R ear, Stg I L ear, DTIs)  Last BM:  02/03/2017 - small type 6  Height:   Ht Readings from Last 1 Encounters:  01/15/17 5\' 10"  (1.778 m)    Weight:   Wt Readings from Last 1 Encounters:  02/03/17 195 lb (88.5 kg)    Ideal Body Weight:  68.18 kg  BMI:  Body mass index is 27.98 kg/m.  Estimated Nutritional Needs:   Kcal:  1734 (PSU 2003b w/ MSJ 1495, Ve 6.4, Tmax 37.8)  Protein:  106-124 grams (1.2-1.4 grams/kg)  Fluid:  1.7-2 L/day (25-30 ml/kg IBW)  EDUCATION NEEDS:   No education needs identified at this time  Helane RimaLeanne Aarica Wax, MS, RD, LDN Office: [phone] Pager: [phone] After Hours/Weekend Pager: [phone]

## 2017-02-03 NOTE — Progress Notes (Addendum)
[facility] -  at [facility]   PATIENT NAME: Jessica Paganhyllis [name]    MR#:  [id number]  DATE OF BIRTH:  Aug 08, 1945  SUBJECTIVE:   The patient was intubated due to acute respiratory failure last night. REVIEW OF SYSTEMS:    Review of Systems  He patient is intubated, unable to get ROS.  DRUG ALLERGIES:   Allergies  Allergen Reactions  . Ambien [Zolpidem Tartrate] Other (See Comments)    Reaction: altered mental status  . Amoxicillin Other (See Comments)    Reaction: blisters in mouth Has patient had a PCN reaction causing immediate rash, facial/tongue/throat swelling, SOB or lightheadedness with hypotension: Yes Has patient had a PCN reaction causing severe rash involving mucus membranes or skin necrosis: No Has patient had a PCN reaction that required hospitalization No Has patient had a PCN reaction occurring within the last 10 years: Yes If all of the above answers are "NO", then may proceed with Cephalosporin use.     VITALS:  Blood pressure (!) 111/59, pulse 80, temperature 99.7 F (37.6 C), resp. rate 15, height 5\' 10"  (1.778 m), weight 195 lb (88.5 kg), SpO2 99 %.  PHYSICAL EXAMINATION:  Constitutional: Appears obese No distress. Morbid obesity. On ventilation. HENT: Normocephalic.   Eyes: Conjunctivae and EOM are normal. PERRLA, no scleral icterus.  Neck:  Neck supple. No JVD. No tracheal deviation. CVS: RRR, S1/S2 +, no murmurs,  Pulmonary: Very course breath sounds and rhonchi. Abdominal: Soft. BS +,  no distension, tenderness, rebound or guarding.  Musculoskeletal: No cyanosis or leg edema.  Neuro: unable to exam. Skin: Skin is warm and dry. No rash noted. Sacral stage 1 Psychiatric: sedated.  LABORATORY PANEL:   CBC  Recent Labs Lab 02/02/17 2255  WBC 5.6  HGB 11.1*  HCT 35.8  PLT 269   ------------------------------------------------------------------------------------------------------------------  Chemistries   Recent  Labs Lab 02/02/17 0504  02/03/17 0507  NA 137  < > 141  K 3.9  < > 3.1*  CL 101  < > 111  CO2 31  < > 22  GLUCOSE 88  < > 61*  BUN 17  < > 15  CREATININE 1.29*  < > 1.37*  CALCIUM 8.2*  < > 6.3*  MG 2.2  --  1.6*  AST 20  --   --   ALT 14  --   --   ALKPHOS 102  --   --   BILITOT 0.6  --   --   < > = values in this interval not displayed. ------------------------------------------------------------------------------------------------------------------  Cardiac Enzymes No results for input(s): TROPONINI in the last 168 hours. ------------------------------------------------------------------------------------------------------------------  RADIOLOGY:  Dg Abd 1 View  Result Date: 02/03/2017 CLINICAL DATA:  Orogastric tube placement.  Initial encounter. EXAM: ABDOMEN - 1 VIEW COMPARISON:  CT of the abdomen and pelvis performed 01/22/2017 FINDINGS: The patient's enteric tube is noted ending overlying the antrum of the stomach. The visualized bowel gas pattern is grossly unremarkable. Small bilateral pleural effusions are noted, with bibasilar airspace opacities. No acute osseous abnormalities are seen. IMPRESSION: 1. Enteric tube noted ending overlying the antrum of the stomach. 2. Small bilateral pleural effusions, with bibasilar airspace opacities. Electronically Signed   By: Roanna RaiderJeffery  Chang M.D.   On: 02/03/2017 02:11   Dg Chest Port 1 View  Result Date: 02/03/2017 CLINICAL DATA:  Endotracheal tube placement.  Initial encounter. EXAM: PORTABLE CHEST 1 VIEW COMPARISON:  Chest radiograph performed 02/02/2017 FINDINGS: The endotracheal tube is seen ending 3 cm above  the carina. An enteric tube is noted extending below the diaphragm. A right IJ line is noted ending about the cavoatrial junction. Small bilateral pleural effusions are noted. Bibasilar airspace opacities raise concern for pulmonary edema. No pneumothorax is seen. The cardiomediastinal silhouette is borderline normal in size.  No acute osseous abnormalities are identified. IMPRESSION: 1. Endotracheal tube seen ending 3 cm above the carina. 2. Small bilateral pleural effusions. Bibasilar airspace opacities raise concern for pulmonary edema. Electronically Signed   By: Roanna Raider M.D.   On: 02/03/2017 02:10   Dg Chest Port 1 View  Result Date: 02/02/2017 CLINICAL DATA:  Oxygen desaturation EXAM: PORTABLE CHEST 1 VIEW COMPARISON:  01/27/2017 FINDINGS: Moderate bilateral pleural effusions. Cardiomegaly with vascular congestion and underlying pulmonary edema. Bibasilar consolidations. IMPRESSION: 1. Moderate bilateral pleural effusions, increased compared to prior radiograph 2. Cardiomegaly with vascular congestion and pulmonary edema. 3. Bibasilar atelectasis or pneumonia, increased compared to prior Electronically Signed   By: Jasmine Pang M.D.   On: 02/02/2017 22:32    ASSESSMENT AND PLAN:   71 year old female with a history of depression and chronically bed bound due to spinal stenosis who presented October 5 due to altered mental status.  * Acute respiratory failure with hypoxia and hypercapnia due to pulmonary edema and pleural effusion. Continue ventilation and sedation.  * Hypotension. On Neo drip.   1.Acute metabolic encephalopathy in the setting of pneumonia and severe depression Patient seen and evaluated by neurology. CT head is negative All sedative medications on hold due to encephalopathy.  MRI brain was negative for acute CVA   As per son, pt was on trazodone before admit, which was restarted.   Due to agitation started haldol injection PRN. Since patient is more confused today, I discussed with the patient husband, who agreed to hold Lyrica but to continue trazodone.  2. HCAP with pleural effusion status post thoracentesis Patient has completed treatment with vancomycin and cefepime ID consultation is appreciated.  3. Bilateral hydronephrosis: As per urology patient,this is probably a  chronic issue. Continue Foley catheter at this time Repeat ultrasound shows mild hydronephrosis on the right and resolved on the left Will need foley until urology follow up in 2-3 weeks after discharge.  4.recurrent urinary tract infections: Urine culture positive for Candida sent to lab cor for sensitivities. cx resulted back, ID suggested to give amphoterecin B and pharmacy to manage labs. Continue amphotericin. Will plan 5-7 day course, continue management of stones and neurogenic bladder by urology per Dr. Sampson Goon. Plan for right ureteroscopy to remove stones as possible nidus per Dr. Apolinar Junes. Normal saline 1L before amphoterecin B due to increased creatinine per Dr. Sampson Goon. Follow up BMP. Discontinue NS iv due to respiratory failure.  5. Severe depression: Patient is being followed by psychiatry Due to severe lethargy All sedative medications including those for depression have been discontinued. Family does not want psychiatrist to see patient any more. Dr. Elisabeth Pigeon asked them again, as pt was more agitated, but they refused, so will manage currently with haldol.   6. Hypokalemia:  resumed KCl daily. Potassium 3.1 today. Electrolytes managed by pharmacist. Follow-up level.  Hypomagnesemia. Given IV magnesium and improved. But magnesium is low at 1.6 today, Electrolytes managed by pharmacist. Follow-up level.  7. Pericardial effusion: Patient has been seen and evaluated by cardiology.Echocardiogram shows moderate  pericardial effusion. Patient has no signs of tamponade Patient will need outpatient follow up with Dr Welton Flakes to Reassess pericardial effusion.  Follow-up repeated echocardiogram.  8. Essential hypertension:  Continue Norvasc   Added metoprolol due to tachycardia.  Hold both Lopressor and Norvasc due to hypotension.  9. Hyperlipidemia: Continue statin  10. Sacral DU stage 1, Present on admission: Cleanse sacral and buttocks area with soap and water.  Apply  Gerhardt's butt cream twice daily.  No briefs or disposable underpads while in bed.   Low air loss bed.  Continue wound care.  11. NSVT: heart rate controlled currently Cardiology following- added metoprolol. Hold Lopressor due to hypotension.  12.  Renal Mass: Will need outpatient UROLOGY follow up.  I discussed with Dr. Sung Amabile. Management plans discussed with the patient's husband and he is in agreement. CODE STATUS: FULL  TOTAL TIME TAKING CARE OF THIS PATIENT: 37 minutes.   POSSIBLE D/C ? days home with HH , DEPENDING ON CLINICAL CONDITION.   Shaune Pollack M.D on 01/26/2017 at 10:42 AM  Between 7am to 6pm - Pager - [phone] After 6pm go to [email or web address] - Social research officer, government  [facility]  Office  [phone]  CC: Primary care physician; Center, [facility]  Note: This dictation was prepared with Nurse, children's dictation along with smaller phrase technology. Any transcriptional errors that result from this process are unintentional.

## 2017-02-03 NOTE — Progress Notes (Signed)
Per Lexine BatonBincy, NP Map >60 is acceptable for patient.

## 2017-02-03 NOTE — Progress Notes (Signed)
[facility] PROGRESS NOTE Date of Admission:  01/15/2017     ID: LUVERNE FARONE is a 71 y.o. female with  Candiduria Principal Problem:   Severe recurrent major depression with psychotic features ([place]) Active Problems:   Pneumonia   Subacute delirium   Altered mental status   Right nephrolithiasis   Oligouria   Subjective: Developed hypercarbic resp failure last night and sent to ICU. Initially on BIPAP but now intubated. UOP almost nil. On phenylepi  ROS  Unable to obtain  Medications:  Antibiotics Given (last 72 hours)    None     . aspirin  81 mg Per Tube Daily  . chlorhexidine gluconate (MEDLINE KIT)  15 mL Mouth Rinse BID  . collagenase   Topical Daily  . enoxaparin (LOVENOX) injection  40 mg Subcutaneous Q24H  . free water  200 mL Per Tube Q8H  . levothyroxine  75 mcg Intravenous Daily  . mouth rinse  15 mL Mouth Rinse 10 times per day  . polyethylene glycol  17 g Per Tube Daily  . senna-docusate  2 tablet Per Tube Daily  . sodium chloride  500 mL Intravenous Q24H  . sodium chloride  500 mL Intravenous Q24H    Objective: Vital signs in last 24 hours: Temp:  [95.9 F (35.5 C)-100 F (37.8 C)] 99 F (37.2 C) (10/24 1300) Pulse Rate:  [69-94] 89 (10/24 1300) Resp:  [10-20] 15 (10/24 1300) BP: (68-120)/(40-98) 108/67 (10/24 1300) SpO2:  [86 %-100 %] 100 % (10/24 1300) FiO2 (%):  [50 %-60 %] 50 % (10/24 1141) Weight:  [88.5 kg (195 lb)] 88.5 kg (195 lb) (10/24 0500) Constitutional: intubated,  HENT: [place]/AT, PERRLA, no scleral icterus Mouth/Throat: Oropharynx is clear and dry. No oropharyngeal exudate.  Cardiovascular: Tachy Pulmonary/Chest: poor effort, bil rhonchi Neck = supple, no nuchal rigidity Abdominal: Soft. obese Bowel sounds are normal.  exhibits no distension. Foley in place - minimal urine in bag Lymphadenopathy: no cervical adenopathy. No axillary adenopathy Neurological: intubated Skin: Skin is warm and dry. No rash  noted. No erythema.  Psychiatric: intubated  Lab Results  Recent Labs  02/02/17 0504 02/02/17 2255 02/03/17 0507  WBC 5.0 5.6  --   HGB 10.5* 11.1*  --   HCT 33.7* 35.8  --   NA 137 138 141  K 3.9 4.4 3.1*  CL 101 103 111  CO2 '31 28 22  '$ BUN '17 17 15  '$ CREATININE 1.29* 1.63* 1.37*   Microbiology: Results for orders placed or performed during the hospital encounter of 01/15/17  Blood culture (routine x 2)     Status: None   Collection Time: 01/15/17  4:04 PM  Result Value Ref Range Status   Specimen Description BLOOD BLOOD LEFT ARM  Final   Special Requests   Final    BOTTLES DRAWN AEROBIC AND ANAEROBIC Blood Culture adequate volume   Culture NO GROWTH 5 DAYS  Final   Report Status 01/20/2017 FINAL  Final  Blood culture (routine x 2)     Status: None   Collection Time: 01/15/17  4:05 PM  Result Value Ref Range Status   Specimen Description BLOOD LEFT ANTECUBITAL  Final   Special Requests   Final    BOTTLES DRAWN AEROBIC AND ANAEROBIC Blood Culture adequate volume   Culture NO GROWTH 5 DAYS  Final   Report Status 01/20/2017 FINAL  Final  Urine Culture     Status: Abnormal   Collection Time: 01/15/17  8:51 PM  Result Value Ref Range Status   Specimen Description URINE, CATHETERIZED  Final   Special Requests NONE  Final   Culture (A)  Final    >=100,000 COLONIES/mL CANDIDA PARAPSILOSIS SEE SEPARATE REPORT FOR SUSCEPTIBILITIES Performed at [facility], [address] [place], [place] [address]    Report Status 01/28/2017 FINAL  Final  Body fluid culture     Status: None   Collection Time: 01/18/17  9:10 AM  Result Value Ref Range Status   Specimen Description PLEURAL  Final   Special Requests NONE  Final   Gram Stain   Final    ABUNDANT WBC PRESENT,BOTH PMN AND MONONUCLEAR NO ORGANISMS SEEN    Culture   Final    NO GROWTH 3 DAYS Performed at [facility], [address], [place], [place] [address]    Report Status 01/22/2017 FINAL  Final  MRSA PCR  Screening     Status: None   Collection Time: 01/18/17  2:15 PM  Result Value Ref Range Status   MRSA by PCR NEGATIVE NEGATIVE Final    Comment:        The GeneXpert MRSA Assay (FDA approved for NASAL specimens only), is one component of a comprehensive MRSA colonization surveillance program. It is not intended to diagnose MRSA infection nor to guide or monitor treatment for MRSA infections.   Urine Culture     Status: Abnormal   Collection Time: 01/20/17 10:52 PM  Result Value Ref Range Status   Specimen Description URINE, RANDOM  Final   Special Requests Normal  Final   Culture 30,000 COLONIES/mL YEAST (A)  Final   Report Status 01/22/2017 FINAL  Final  Urine Culture     Status: Abnormal   Collection Time: 01/28/17  6:28 PM  Result Value Ref Range Status   Specimen Description URINE, RANDOM  Final   Special Requests NONE  Final   Culture 40,000 COLONIES/mL YEAST (A)  Final   Report Status 01/29/2017 FINAL  Final  MRSA PCR Screening     Status: None   Collection Time: 02/02/17 11:23 PM  Result Value Ref Range Status   MRSA by PCR NEGATIVE NEGATIVE Final    Comment:        The GeneXpert MRSA Assay (FDA approved for NASAL specimens only), is one component of a comprehensive MRSA colonization surveillance program. It is not intended to diagnose MRSA infection nor to guide or monitor treatment for MRSA infections.   Culture, respiratory (NON-Expectorated)     Status: None (Preliminary result)   Collection Time: 02/03/17  7:42 AM  Result Value Ref Range Status   Specimen Description TRACHEAL ASPIRATE  Final   Special Requests NONE  Final   Gram Stain   Final    FEW WBC PRESENT, PREDOMINANTLY PMN FEW GRAM POSITIVE COCCI Performed at [facility], [address] [place], [place] [address]    Culture PENDING  Incomplete   Report Status PENDING  Incomplete    Studies/Results: Dg Abd 1 View  Result Date: 02/03/2017 CLINICAL DATA:  Orogastric tube placement.   Initial encounter. EXAM: ABDOMEN - 1 VIEW COMPARISON:  CT of the abdomen and pelvis performed 01/22/2017 FINDINGS: The patient's enteric tube is noted ending overlying the antrum of the stomach. The visualized bowel gas pattern is grossly unremarkable. Small bilateral pleural effusions are noted, with bibasilar airspace opacities. No acute osseous abnormalities are seen. IMPRESSION: 1. Enteric tube noted ending overlying the antrum of the stomach. 2. Small  bilateral pleural effusions, with bibasilar airspace opacities. Electronically Signed   By: Garald Balding M.D.   On: 02/03/2017 02:11   Dg Chest Port 1 View  Result Date: 02/03/2017 CLINICAL DATA:  Endotracheal tube placement.  Initial encounter. EXAM: PORTABLE CHEST 1 VIEW COMPARISON:  Chest radiograph performed 02/02/2017 FINDINGS: The endotracheal tube is seen ending 3 cm above the carina. An enteric tube is noted extending below the diaphragm. A right IJ line is noted ending about the cavoatrial junction. Small bilateral pleural effusions are noted. Bibasilar airspace opacities raise concern for pulmonary edema. No pneumothorax is seen. The cardiomediastinal silhouette is borderline normal in size. No acute osseous abnormalities are identified. IMPRESSION: 1. Endotracheal tube seen ending 3 cm above the carina. 2. Small bilateral pleural effusions. Bibasilar airspace opacities raise concern for pulmonary edema. Electronically Signed   By: Garald Balding M.D.   On: 02/03/2017 02:10   Dg Chest Port 1 View  Result Date: 02/02/2017 CLINICAL DATA:  Oxygen desaturation EXAM: PORTABLE CHEST 1 VIEW COMPARISON:  01/27/2017 FINDINGS: Moderate bilateral pleural effusions. Cardiomegaly with vascular congestion and underlying pulmonary edema. Bibasilar consolidations. IMPRESSION: 1. Moderate bilateral pleural effusions, increased compared to prior radiograph 2. Cardiomegaly with vascular congestion and pulmonary edema. 3. Bibasilar atelectasis or pneumonia,  increased compared to prior Electronically Signed   By: Donavan Foil M.D.   On: 02/02/2017 22:32    Assessment/Plan: THEA HOLSHOUSER is a 71 y.o. female admitted with AMS and found to have pericardial and pleural effusion as well as PNA.  She also has had recurrent UTIs with yeast for about 6 months. Candiduria is unusual in patients without indwelling urinary caths however she has had multiple positive cultures with pyuria noted on UA.  CT did reveal stones and urology had foley placed for possible neurogenic bladder Cx reveals candida parapsilosis. Sensitivities reviewed and reveal resistant to fluconazole but sensitive to amphotericin. Intermediate to voriconazole. Echinocandins do not treat UTIs. She was started on amphotericin but has progressive ARF and decreased UOP. Transferred to unit for hypercarbic resp failure and intubated now and on pressors.  Recommendations Will hold on amphotericin. I think she may have cleared her infection with just the few doses she received.  Regardless her candiduria is not the cause of her current respiratory decline and ICU stay so would put management of the stones and candiduria on hold. Continue critical care management and treatment of renal failure. Thank you very much for the consult. Will follow with you.  Leonel Ramsay   02/03/2017, 2:24 PM

## 2017-02-03 NOTE — Progress Notes (Signed)
OT Cancellation Note  Patient Details Name: Jessica [name] MRN: [id number] DOB: Jun 29, 1945   Cancelled Treatment:    Reason Eval/Treat Not Completed: Patient not medically ready. Per chart pt transferred to the ICU with acute hypoxic/hypercarbic respiratory failure related to bilateral pleural effusion requiring BiPAP. Pt intubated early this am. Due to change in pt status, will require new OT orders to continue therapy. Will sign off.   Richrd PrimeJamie Stiller, MPH, MS, OTR/L ascom [phone] 02/03/17, 8:12 AM

## 2017-02-03 NOTE — Progress Notes (Signed)
Urology Consult Follow Up  Subjective: Developed respiratory distress overnight, taken to ICU and intubated.  Currently on neo-for blood pressure support.  Oliguric, nurse has flushed catheter and exchanged catheter with minimal improvement.  Anti-infectives: Anti-infectives    Start     Dose/Rate Route Frequency Ordered Stop   02/03/17 0900  amphotericin B (FUNGIZONE) 40 mg in dextrose 5 % 500 mL IVPB     40 mg 125 mL/hr over 4 Hours Intravenous Every 24 hours 02/02/17 1614     02/01/17 1100  amphotericin B lipid (ABELCET) 400 mg in dextrose 5 % 250 mL IVPB  Status:  Discontinued    Comments:  DO NOT FLUSH WITH NORMAL SALINE- USE D5W AS ORDERED.   400 mg 165 mL/hr over 120 Minutes Intravenous Every 24 hours 01/31/17 1134 02/02/17 1555   01/30/17 1100  amphotericin B lipid (ABELCET) 400 mg in dextrose 5 % 250 mL IVPB  Status:  Discontinued     400 mg 165 mL/hr over 120 Minutes Intravenous Every 24 hours 01/30/17 1026 01/31/17 1134   01/29/17 1630  amphotericin B lipid (ABELCET) 400 mg in dextrose 5 % 250 mL IVPB  Status:  Discontinued     400 mg 165 mL/hr over 120 Minutes Intravenous Every 24 hours 01/29/17 1514 01/30/17 1026   01/23/17 1000  fluconazole (DIFLUCAN) tablet 200 mg  Status:  Discontinued     200 mg Oral Daily 01/22/17 1506 01/29/17 1514   01/21/17 1600  ceFEPIme (MAXIPIME) 2 g in dextrose 5 % 50 mL IVPB  Status:  Discontinued     2 g 100 mL/hr over 30 Minutes Intravenous Every 8 hours 01/21/17 0853 01/22/17 1439   01/21/17 1500  vancomycin (VANCOCIN) IVPB 750 mg/150 ml premix     750 mg 150 mL/hr over 60 Minutes Intravenous  Once 01/21/17 1058 01/21/17 1808   01/20/17 1100  vancomycin (VANCOCIN) IVPB 1000 mg/200 mL premix     1,000 mg 200 mL/hr over 60 Minutes Intravenous  Once 01/20/17 1024 01/20/17 1429   01/19/17 1100  vancomycin (VANCOCIN) IVPB 1000 mg/200 mL premix     1,000 mg 200 mL/hr over 60 Minutes Intravenous STAT 01/19/17 1054 01/19/17 1231   01/19/17 0630   vancomycin (VANCOCIN) IVPB 1000 mg/200 mL premix  Status:  Discontinued     1,000 mg 200 mL/hr over 60 Minutes Intravenous Every 24 hours 01/19/17 0616 01/19/17 1054   01/16/17 0400  aztreonam (AZACTAM) 1 g in dextrose 5 % 50 mL IVPB  Status:  Discontinued     1 g 100 mL/hr over 30 Minutes Intravenous Every 8 hours 01/15/17 2023 01/15/17 2146   01/15/17 2200  ceFEPIme (MAXIPIME) 2 g in dextrose 5 % 50 mL IVPB  Status:  Discontinued     2 g 100 mL/hr over 30 Minutes Intravenous Every 8 hours 01/15/17 2146 01/21/17 0853   01/15/17 2130  fluconazole (DIFLUCAN) tablet 200 mg  Status:  Discontinued     200 mg Oral Daily 01/15/17 2127 01/22/17 1439   01/15/17 2015  aztreonam (AZACTAM) 2 g in dextrose 5 % 50 mL IVPB     2 g 100 mL/hr over 30 Minutes Intravenous  Once 01/15/17 2012 01/15/17 2139   01/15/17 2015  vancomycin (VANCOCIN) IVPB 1000 mg/200 mL premix  Status:  Discontinued     1,000 mg 200 mL/hr over 60 Minutes Intravenous  Once 01/15/17 2012 01/19/17 1054   01/15/17 0300  vancomycin (VANCOCIN) 1,250 mg in sodium chloride 0.9 % 250 mL  IVPB  Status:  Discontinued     1,250 mg 166.7 mL/hr over 90 Minutes Intravenous Every 12 hours 01/15/17 2023 01/17/17 1611      Current Facility-Administered Medications  Medication Dose Route Frequency Provider Last Rate Last Dose  . acetaminophen (TYLENOL) tablet 650 mg  650 mg Per Tube Daily PRN Varughese, Bincy S, NP      . acetaminophen (TYLENOL) tablet 650 mg  650 mg Per Tube Q6H PRN Wilhelmina Mcardle, MD      . albuterol (PROVENTIL) (2.5 MG/3ML) 0.083% nebulizer solution 3 mL  3 mL Inhalation Q4H PRN Henreitta Leber, MD   3 mL at 01/24/17 0454  . amphotericin B (FUNGIZONE) 40 mg in dextrose 5 % 500 mL IVPB  40 mg Intravenous Q24H Pernell Dupre, RPH 125 mL/hr at 02/03/17 0914 40 mg at 02/03/17 0914  . aspirin chewable tablet 81 mg  81 mg Per Tube Daily Demetrios Loll, MD   81 mg at 02/03/17 1017  . chlorhexidine gluconate (MEDLINE KIT) (PERIDEX)  0.12 % solution 15 mL  15 mL Mouth Rinse BID Demetrios Loll, MD   15 mL at 02/03/17 0720  . collagenase (SANTYL) ointment   Topical Daily Demetrios Loll, MD      . enoxaparin (LOVENOX) injection 40 mg  40 mg Subcutaneous Q24H Henreitta Leber, MD   40 mg at 02/02/17 2154  . famotidine (PEPCID) IVPB 20 mg premix  20 mg Intravenous Daily Wilhelmina Mcardle, MD   Stopped at 02/03/17 1118  . feeding supplement (VITAL AF 1.2 CAL) liquid 1,000 mL  1,000 mL Per Tube Continuous Wilhelmina Mcardle, MD 60 mL/hr at 02/03/17 1044 1,000 mL at 02/03/17 1044  . fentaNYL (SUBLIMAZE) injection 50 mcg  50 mcg Intravenous Q2H PRN Varughese, Bincy S, NP   50 mcg at 02/03/17 0802  . free water 200 mL  200 mL Per Tube Q8H Wilhelmina Mcardle, MD   200 mL at 02/03/17 0930  . gadobenate dimeglumine (MULTIHANCE) injection 19 mL  19 mL Intravenous Once PRN Mody, Sital, MD      . levothyroxine (SYNTHROID, LEVOTHROID) injection 75 mcg  75 mcg Intravenous Daily Wilhelmina Mcardle, MD      . liver oil-zinc oxide (DESITIN) 40 % ointment 1 application  1 application Topical PRN Henreitta Leber, MD      . MEDLINE mouth rinse  15 mL Mouth Rinse 10 times per day Demetrios Loll, MD   15 mL at 02/03/17 1200  . ondansetron (ZOFRAN) injection 4 mg  4 mg Intravenous Q6H PRN Henreitta Leber, MD   4 mg at 02/01/17 2316  . phenylephrine (NEO-SYNEPHRINE) 40 mg in sodium chloride 0.9 % 250 mL (0.16 mg/mL) infusion  0-400 mcg/min Intravenous Titrated Wilhelmina Mcardle, MD 37.5 mL/hr at 02/03/17 1000 100 mcg/min at 02/03/17 1000  . polyethylene glycol (MIRALAX / GLYCOLAX) packet 17 g  17 g Per Tube Daily Varughese, Bincy S, NP   17 g at 02/03/17 1017  . senna-docusate (Senokot-S) tablet 2 tablet  2 tablet Per Tube Daily Varughese, Bincy S, NP   2 tablet at 02/03/17 1017  . sodium chloride 0.9 % bolus 500 mL  500 mL Intravenous Q24H Hallaji, Sheema M, RPH   500 mL at 02/03/17 0800  . sodium chloride 0.9 % bolus 500 mL  500 mL Intravenous Q24H Hallaji, Sheema M, RPH          Objective: Vital signs in last 24 hours: Temp:  [  95.9 F (35.5 C)-100 F (37.8 C)] 99 F (37.2 C) (10/24 1300) Pulse Rate:  [69-94] 89 (10/24 1300) Resp:  [10-20] 15 (10/24 1300) BP: (68-120)/(40-98) 108/67 (10/24 1300) SpO2:  [86 %-100 %] 100 % (10/24 1300) FiO2 (%):  [50 %-60 %] 50 % (10/24 0740) Weight:  [195 lb (88.5 kg)] 195 lb (88.5 kg) (10/24 0500)  Intake/Output from previous day: 10/23 0701 - 10/24 0700 In: 1954.8 [I.V.:454.8; IV Piggyback:1500] Out: 100 [Urine:100] Intake/Output this shift: Total I/O In: 1851 [I.V.:375; NG/GT:276; IV Piggyback:1200] Out: -    Physical Exam  Constitutional: She is oriented to person, place, and time.  Intubated, alert but unable to communicate  HENT:  Head: Normocephalic and atraumatic.  Pulmonary/Chest: No respiratory distress.  Abdominal: Soft.  Genitourinary:  Genitourinary Comments: Foley catheter in place draining scant yellow urine, very minimal amount  Neurological: She is alert and oriented to person, place, and time.  Skin: Skin is warm and dry.    Lab Results:  BMET  Recent Labs  02/02/17 2255 02/03/17 0507  NA 138 141  K 4.4 3.1*  CL 103 111  CO2 28 22  GLUCOSE 105* 61*  BUN 17 15  CREATININE 1.63* 1.37*  CALCIUM 8.2* 6.3*    Assessment/ Plan:  71 year old female with multiple GU issues admitted with altered mental status, pericardial/pleural effusions with pneumonia, recurrent yeast UTIs.  Patient was scheduled for elective ureteroscopy today to treat small right-sided nonobstructing stones.  In light of recent clinical change, we will cancel this today.  Discussed this with both her husband and son today extensively.  They are very frustrated with her overall care.  I allowed them to vent today about various complaints.    1.  Candiduria-currently on amphotericin per ID.  2.  Right renal stones- possible nidus for infection.  In light of clinical change, surgery canceled today.  Will  reevaluate timing based on her clinical status.  3.  Neurogenic bladder with bilateral hydro--hydronephrosis improved with Foley catheter.    4.  Left indeterminate renal lesion, 2.6 cm- outpatient CT with and without contrast ordered.  5. Oligouria-very little urine output since transfer to the ICU, creatinine actually improved since yesterday.  Will obtain renal ultrasound to rule out obstruction, although suspect possible prerenal versus nephrotoxicity as most likely cause.   Case discussed with Dr. Alva Garnet today.    LOS: 19 days    Hollice Espy 02/03/2017

## 2017-02-03 NOTE — Progress Notes (Addendum)
SUBJECTIVE: Pt is responsive, able to open eyes and shake head yes/no to questions. Denies chest pain.    Vitals:   02/03/17 0700 02/03/17 0800 02/03/17 0900 02/03/17 1000  BP: 118/68 (!) 88/68 99/60 (!) 111/59  Pulse: 89 94 84 80  Resp: 15 17 17 15   Temp: 100 F (37.8 C) 99.9 F (37.7 C) 99.7 F (37.6 C)   TempSrc:      SpO2: 99% 99% 100% 99%  Weight:      Height:        Intake/Output Summary (Last 24 hours) at 02/03/17 1155 Last data filed at 02/03/17 0800  Gross per 24 hour  Intake          2504.75 ml  Output              100 ml  Net          2404.75 ml    LABS: Basic Metabolic Panel:  Recent Labs  16/01/9609/23/18 0504 02/02/17 2255 02/03/17 0507  NA 137 138 141  K 3.9 4.4 3.1*  CL 101 103 111  CO2 31 28 22   GLUCOSE 88 105* 61*  BUN 17 17 15   CREATININE 1.29* 1.63* 1.37*  CALCIUM 8.2* 8.2* 6.3*  MG 2.2  --  1.6*  PHOS 5.5*  --   --    Liver Function Tests:  Recent Labs  02/02/17 0504  AST 20  ALT 14  ALKPHOS 102  BILITOT 0.6  PROT 5.9*  ALBUMIN 2.0*   No results for input(s): LIPASE, AMYLASE in the last 72 hours. CBC:  Recent Labs  02/02/17 0504 02/02/17 2255  WBC 5.0 5.6  NEUTROABS  --  4.4  HGB 10.5* 11.1*  HCT 33.7* 35.8  MCV 82.5 83.6  PLT 264 269   Cardiac Enzymes: No results for input(s): CKTOTAL, CKMB, CKMBINDEX, TROPONINI in the last 72 hours. BNP: Invalid input(s): POCBNP D-Dimer: No results for input(s): DDIMER in the last 72 hours. Hemoglobin A1C: No results for input(s): HGBA1C in the last 72 hours. Fasting Lipid Panel: No results for input(s): CHOL, HDL, LDLCALC, TRIG, CHOLHDL, LDLDIRECT in the last 72 hours. Thyroid Function Tests: No results for input(s): TSH, T4TOTAL, T3FREE, THYROIDAB in the last 72 hours.  Invalid input(s): FREET3 Anemia Panel: No results for input(s): VITAMINB12, FOLATE, FERRITIN, TIBC, IRON, RETICCTPCT in the last 72 hours.   PHYSICAL EXAM General: Intubated, responsive to questions, appears  comfortable HEENT:  Normocephalic and atramatic Neck:  No JVD.  Lungs: Clear. Heart: HRRR . Normal S1 and S2 without gallops or murmurs.  Abdomen: +bowel sounds, abdomen soft and non-tender  Msk:  Back normal, normal gait. Normal strength and tone for age. Extremities: No clubbing, cyanosis or edema.   Neuro: Lethargic, intubated. responds to questions with head nod and attempts eye contact.   TELEMETRY: Sinus rhythm 84pm  ASSESSMENT AND PLAN: Pts respiratory status deteriorated and was transferred to ICU and required intubation. Chest xray shows large pleural effusions. Pt is borderline hypotensive, rate is WNL. Discussed with critical care attending, repeat echo to check status of pericardial effusion. Discussed pt's status with Dr. Welton FlakesKhan and he is aware.   Principal Problem:   Severe recurrent major depression with psychotic features (HCC) Active Problems:   Pneumonia   Subacute delirium   Altered mental status   Right nephrolithiasis    Caroleen HammanKristin Hailei Besser, NP-C 02/03/2017 11:55 AM

## 2017-02-03 NOTE — Procedures (Signed)
Central Venous Catheter Insertion Procedure Note- Right Internal Jugular Duard Bradyhyllis W Birenbaum [id number] 24-Dec-1945  Procedure: Insertion of Central Venous Catheter Indications: Assessment of intravascular volume, Drug and/or fluid administration and Frequent blood sampling  Procedure Details Consent: Risks of procedure as well as the alternatives and risks of each were explained to the (patient/caregiver).  Consent for procedure obtained. Time Out: Verified patient identification, verified procedure, site/side was marked, verified correct patient position, special equipment/implants available, medications/allergies/relevent history reviewed, required imaging and test results available.  Performed  Maximum sterile technique was used including antiseptics, cap, gloves, gown, hand hygiene, mask and sheet. Skin prep: Chlorhexidine; local anesthetic administered A antimicrobial bonded/coated triple lumen catheter was placed in the right internal jugular vein using the Seldinger technique.  Evaluation Blood flow good Complications: No apparent complications Patient did tolerate procedure well. Chest X-ray ordered to verify placement.  CXR: pending.  Procedure performed under direct ultrasound guidance for real time vessel cannulation.     Bincy Varughese,AG-ACNP Pulmonary & Critical Care  Billy Fischeravid Mylo Driskill, MD PCCM service Mobile [phone] Pager [phone] 02/03/2017 3:15 PM

## 2017-02-03 NOTE — Procedures (Signed)
Intubation Procedure Note Jessica [name] [id number] Dec 13, 1945  Procedure: Intubation Indications: Respiratory insufficiency  Procedure Details Consent: Risks of procedure as well as the alternatives and risks of each were explained to the (patient/caregiver).  Consent for procedure obtained. Time Out: Verified patient identification, verified procedure, site/side was marked, verified correct patient position, special equipment/implants available, medications/allergies/relevent history reviewed, required imaging and test results available.  Performed  Drugs: 20mg  Etomidate, 50mg  Rocuronium.  with # 4 blade. Grade 1 view. 7.5 ET  tube visualized passing through vocal cords. Following intubation:  positive color change on ETCO2, condensation seen in endotracheal tube, equal breath sounds bilaterally.  Evaluation Hemodynamic Status: Transient hypotension treated with pressors; O2 sats: stable throughout Patient's Current Condition: stable Complications: No apparent complications Patient did tolerate procedure well. Chest X-ray ordered to verify placement.  CXR: pending.   Bincy Varughese,AG-ACNP Pulmonary & Critical Care   Billy Fischeravid Kayman Snuffer, MD PCCM service Mobile [phone] Pager [phone] 02/03/2017 3:15 PM

## 2017-02-03 NOTE — Care Management (Signed)
Patient transferred to ICU 10/23 PM with acute hypercarbic respiratory failure requiring intubation.

## 2017-02-03 NOTE — Progress Notes (Signed)
PULMONARY / CRITICAL CARE MEDICINE   Name: Jessica [name] MRN: [id number] DOB: 22-Jan-1946    ADMISSION DATE:  01/15/2017 CONSULTATION DATE:  10/24  PT PROFILE:   58 F with hx of spinal stenosis - bed-bound X 12 yrs, psychotic depression, neurogenic bladder, recurrent UTI, obesity, frequent hospitalizations over past 5 months admitted to hospitalist service from home 10/05 with AMS, suspected PNA, pleural effusion, pericardial effusion. Complicated hospitalization. Transferred to ICU 10/23 PM with acute hypercarbic respiratory failure requiring intubation.  MAJOR EVENTS/TEST RESULTS: 10/05 Admission as above 10/05 CT chest: Large pericardial effusion. Moderate left pleural effusion with near total collapse of the left lower lobe. Small right pleural effusion with mild right basilar atelectasis 10/06 Cardiology consultation for pericardial effusion - no intervention planned 10/06 Echocardiogram: LVEF 55-60%.  Moderate to large pericardial effusion.  Equivocal findings of tamponade physiology 10/08 L thoracentesis. 375 cc amber fluid. LDH 173, protein 3.8, WBC 1281 - 33 segs, 44 lymphs, 23 monos 10/09 Neurology consultation for AMS/delirium. Deemed TME. Recommended holding sedatives 10/09 CT head: Chronic ischemic microangiopathy without acute intracranial abnormality 10/10 Psychiatry consultation: Severe recurrent major depression with psychotic features.  Recommended discontinuation of Abilify, initiate Zyprexa 5 mg nightly, taper Effexor to off, initiate mirtazapine 10/12 ID consultation: Discontinue vancomycin/cefepime.  Initiate fluconazole for candiduria 10/12 CT renal stone: Moderate bilateral hydroureteronephrosis to the level of the ureterovesical junctions bilaterally. Layering small stones throughout the right renal collecting system and pelvic segment of the right ureter. Stones do not appear to be obstructing. Mild diffuse bladder wall thickening, suggesting acute cystitis. Punctate  layering bladder stones 10/12 Urology consultation for bilateral hydronephrosis.  Felt to be due to neurogenic bladder.  Renal, bladder and ureteral stones noted but felt to be nonobstructing.  Left lower pole renal mass noted.  Indeterminate on noncontrast CT scan.  No further evaluation warranted at this time. 10/14 MRI brain: No acute intracranial abnormality. Mild to moderate for age nonspecific cerebral white matter signal changes, most commonly due to chronic small vessel disease 10/15 Renal US: Mild right-sided hydronephrosis with multiple stones. No definite left-sided stones. The previously demonstrated exophytic slightly hyperdense focus arising from the lower pole of the left kidney is not evident  10/15 WOC consultation: Severe recurrent major depression with psychotic features.  Wound care recommendations made 10/23 Increased somnolence. ABG 7.33/80/82. Transferred to ICU/SDU and intubated in early AM 10/24 10/24 Renal US: Stable right renal calculi. No obstructive changes are noted 10/24 repeat echocardiogram:  10/24 Responsive and F/C. On low dose phenylephrine.   INDWELLING DEVICES:: ETT 10/24 >>  R IJ CVL 10/24 >>   MICRO DATA: MRSA PCR 10/08 >> NEG Urine 10/05 >> >100,000 candida parapsilosis Pleural fluid 10/08 >> NEG Blood 10/05 >> NEG  Urine 10/10 30k yeast Urine 10/18 40k yeast MRSA PCR 10/23 >> NEG Blood 10/24 >>  Resp 10/24 >>   ANTIMICROBIALS:  Vanc 10/05 >> 10/12 Cefepime 10/05 >> 10/12 Fluconazole 10/05 >> 10/19 Ampho B 10/19 >>    SUBJECTIVE:  RASS -1, -2. NAD.  Intubated, sedated.  Low-dose phenylephrine.  + F/C  VITAL SIGNS: BP 96/73   Pulse 76   Temp 99 F (37.2 C)   Resp 16   Ht 5\' 10"  (1.778 m)   Wt 88.5 kg (195 lb)   SpO2 96%   BMI 27.98 kg/m   HEMODYNAMICS:    VENTILATOR SETTINGS: Vent Mode: PRVC FiO2 (%):  [50 %-60 %] 50 % Set Rate:  [15 bmp] 15 bmp Vt Set:  [  500 mL] 500 mL PEEP:  [5 cmH20] 5 cmH20  INTAKE / OUTPUT: I/O  last 3 completed shifts: In: 2133.8 [P.O.:120; I.V.:513.8; IV Piggyback:1500] Out: 300 [Urine:300]  PHYSICAL EXAMINATION: General: Chronically ill-appearing, intubated, sedated, RASS -2 Neuro: CNs intact, paraplegia, BUE very weak, DTRs absent in BLE HEENT: NCAT, sclerae white Cardiovascular: reg, muffled heart sounds, no murmur noted Lungs: Breath sounds diminished but dependently, no wheezes Abdomen: Obese, soft, NT, + BS Ext: Muscle atrophy in the BLE, warm, diminished distal pulses, no edema Skin: Sacral decubitus ulcer -unstaged  LABS:  BMET  Recent Labs Lab 02/02/17 0504 02/02/17 2255 02/03/17 0507  NA 137 138 141  K 3.9 4.4 3.1*  CL 101 103 111  CO2 31 28 22   BUN 17 17 15   CREATININE 1.29* 1.63* 1.37*  GLUCOSE 88 105* 61*    Electrolytes  Recent Labs Lab 02/01/17 0403 02/02/17 0504 02/02/17 2255 02/03/17 0507  CALCIUM 8.1* 8.2* 8.2* 6.3*  MG 2.2 2.2  --  1.6*  PHOS  --  5.5*  --   --     CBC  Recent Labs Lab 01/29/17 0453 02/02/17 0504 02/02/17 2255  WBC 8.9 5.0 5.6  HGB 12.0 10.5* 11.1*  HCT 36.2 33.7* 35.8  PLT 320 264 269    Coag's  Recent Labs Lab 02/02/17 0504  INR 1.15    Sepsis Markers No results for input(s): LATICACIDVEN, PROCALCITON, O2SATVEN in the last 168 hours.  ABG  Recent Labs Lab 02/02/17 2232 02/03/17 0020 02/03/17 0330  PHART 7.15* 7.18* 7.33*  PCO2ART 87* 80* 51*  PO2ART 57* 82* 59*    Liver Enzymes  Recent Labs Lab 02/02/17 0504  AST 20  ALT 14  ALKPHOS 102  BILITOT 0.6  ALBUMIN 2.0*    Cardiac Enzymes No results for input(s): TROPONINI, PROBNP in the last 168 hours.  Glucose  Recent Labs Lab 02/02/17 2251 02/02/17 2312  GLUCAP 95 108*    CXR: Cardiomegaly, moderate bilat pleural effusions  ASSESSMENT / PLAN:  PULMONARY A: Acute hypercarbic respiratory failure Bilateral pleural effusions P:   Cont full vent support - settings reviewed and/or adjusted Cont vent bundle Daily  SBT if/when meets criteria  CARDIOVASCULAR A:  Pericardial effusion -unclear etiology Hypotension P:  Cardiology following Repeat echocardiogram ordered 10/24 Continue phenylephrine to maintain MIP > 65 mmHg  RENAL A:   AKI -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 Oliguria Hypokalemia P:   Discussed with ID -stop amphotericin Monitor BMET intermittently Monitor I/Os Correct electrolytes as indicated   GASTROINTESTINAL A:   Obesity P:   SUP: IV famotidine TF protocol initiated 10/24  HEMATOLOGIC A:   Mild anemia without acute blood loss P:  DVT px: Lovenox Monitor CBC intermittently Transfuse per usual guidelines   INFECTIOUS A:   Candiduria Sacral pressure wound Recent PNA -completed 8 days vanc/cefepime P:   Monitor temp, WBC count Micro and abx as above  ID service following  ENDOCRINE A:   Hypothyroidism P:   Continue levothyroxine  NEUROLOGIC A:   Profound deconditioning @ baseline Spinal stenosis with paraplegia Depression with psychotic features ICU/vent assoc discomfort P:   RASS goal: 0, -1 PAD protocol Holding psych meds for now   FAMILY  - Updates: Husband and son updated in detail.  We discussed the course of this hospitalization, her current critical illness, prognosis and touched on goals of care.   CCM time: 50 mins The above time includes time spent in consultation with patient and/or family members and reviewing care plan on  multidisciplinary rounds  Billy Fischer, MD PCCM service Mobile [phone] Pager [phone] 02/03/2017, 3:15 PM

## 2017-02-03 NOTE — Progress Notes (Signed)
Emotional support with pt's spouse and son.    02/03/17 0800  Clinical Encounter Type  Visited With Family;Health care provider  Visit Type Initial;Spiritual support  Referral From Nurse  Consult/Referral To Chaplain  Spiritual Encounters  Spiritual Needs Emotional

## 2017-02-03 NOTE — Progress Notes (Signed)
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 Patient previously on fluconazole. ID following.   Plan: Amphotericin is currently being held due to renal failure/decline in clinical course. All consults and premedications from amphotericin have been stopped. Will continue to follow with ID and resume as warranted.    Height: 5\' 10"  (177.8 cm) Weight: 195 lb (88.5 kg) IBW/kg (Calculated) : 68.5  Temp (24hrs), Avg:98.1 F (36.7 C), Min:95.9 F (35.5 C), Max:100 F (37.8 C)   Recent Labs Lab 01/29/17 0453  01/31/17 0347 02/01/17 0403 02/02/17 0504 02/02/17 2255 02/03/17 0507  WBC 8.9  --   --   --  5.0 5.6  --   CREATININE 0.61  < > 1.18* 0.83 1.29* 1.63* 1.37*  < > = values in this interval not displayed.  Estimated Creatinine Clearance: 46.8 mL/min (A) (by C-G formula based on SCr of 1.37 mg/dL (H)).    Allergies  Allergen Reactions  . Ambien [Zolpidem Tartrate] Other (See Comments)    Reaction: altered mental status  . Amoxicillin Other (See Comments)    Reaction: blisters in mouth Has patient had a PCN reaction causing immediate rash, facial/tongue/throat swelling, SOB or lightheadedness with hypotension: Yes Has patient had a PCN reaction causing severe rash involving mucus membranes or skin necrosis: No Has patient had a PCN reaction that required hospitalization No Has patient had a PCN reaction occurring within the last 10 years: Yes If all of the above answers are "NO", then may proceed with Cephalosporin use.     Antimicrobials this admission: 10/20 amphotericin B Lipid >> 10/23 10/24 Amphotericin B deoxycholate >> 10/24  Dose adjustments this admission:  Microbiology results: 10/5  UCx: >=100,000 COLONIES/mL CANDIDA PARAPSILOSIS   Thank you for allowing pharmacy to be a part of this patient's care.  Simpson,Michael L, PharmD, BCPS Clinical  Pharmacist 02/03/2017 7:00 PM

## 2017-02-04 ENCOUNTER — Inpatient Hospital Stay
Admit: 2017-02-04 | Discharge: 2017-02-04 | Disposition: A | Discharge status: Home / Self Care | Payer: Medicare Other | Attending: Pulmonary Disease | Admitting: Pulmonary Disease

## 2017-02-04 ENCOUNTER — Inpatient Hospital Stay: Payer: Medicare Other

## 2017-02-04 DIAGNOSIS — J9621 Acute and chronic respiratory failure with hypoxia: Secondary | ICD-10-CM

## 2017-02-04 DIAGNOSIS — J9622 Acute and chronic respiratory failure with hypercapnia: Secondary | ICD-10-CM

## 2017-02-04 LAB — HEPATIC FUNCTION PANEL
ALT: 14 U/L (ref 14–54)
AST: 22 U/L (ref 15–41)
Albumin: 1.8 g/dL — ABNORMAL LOW (ref 3.5–5.0)
Alkaline Phosphatase: 108 U/L (ref 38–126)
Bilirubin, Direct: 0.1 mg/dL (ref 0.1–0.5)
Indirect Bilirubin: 0.5 mg/dL (ref 0.3–0.9)
Total Bilirubin: 0.6 mg/dL (ref 0.3–1.2)
Total Protein: 5.3 g/dL — ABNORMAL LOW (ref 6.5–8.1)

## 2017-02-04 LAB — CBC
HCT: 29.2 % — ABNORMAL LOW (ref 35.0–47.0)
Hemoglobin: 9.1 g/dL — ABNORMAL LOW (ref 12.0–16.0)
MCH: 25.4 pg — ABNORMAL LOW (ref 26.0–34.0)
MCHC: 31.2 g/dL — ABNORMAL LOW (ref 32.0–36.0)
MCV: 81.3 fL (ref 80.0–100.0)
Platelets: 267 10*3/uL (ref 150–440)
RBC: 3.59 MIL/uL — ABNORMAL LOW (ref 3.80–5.20)
RDW: 16 % — ABNORMAL HIGH (ref 11.5–14.5)
WBC: 8 10*3/uL (ref 3.6–11.0)

## 2017-02-04 LAB — ECHOCARDIOGRAM COMPLETE
Height: 70 in
Weight: 3079.39 oz

## 2017-02-04 LAB — BASIC METABOLIC PANEL
Anion gap: 7 (ref 5–15)
BUN: 21 mg/dL — ABNORMAL HIGH (ref 6–20)
CO2: 24 mmol/L (ref 22–32)
Calcium: 7.6 mg/dL — ABNORMAL LOW (ref 8.9–10.3)
Chloride: 104 mmol/L (ref 101–111)
Creatinine, Ser: 1.95 mg/dL — ABNORMAL HIGH (ref 0.44–1.00)
GFR calc Af Amer: 29 mL/min — ABNORMAL LOW (ref >60)
GFR calc non Af Amer: 25 mL/min — ABNORMAL LOW (ref >60)
Glucose, Bld: 143 mg/dL — ABNORMAL HIGH (ref 65–99)
Potassium: 4.8 mmol/L (ref 3.5–5.1)
Sodium: 135 mmol/L (ref 135–145)

## 2017-02-04 LAB — PHOSPHORUS: Phosphorus: 1.9 mg/dL — ABNORMAL LOW (ref 2.5–4.6)

## 2017-02-04 LAB — TSH: TSH: 2.853 u[IU]/mL (ref 0.350–4.500)

## 2017-02-04 LAB — GLUCOSE, CAPILLARY
Glucose-Capillary: 127 mg/dL — ABNORMAL HIGH (ref 65–99)
Glucose-Capillary: 122 mg/dL — ABNORMAL HIGH (ref 65–99)
Glucose-Capillary: 140 mg/dL — ABNORMAL HIGH (ref 65–99)

## 2017-02-04 LAB — MAGNESIUM: Magnesium: 2.9 mg/dL — ABNORMAL HIGH (ref 1.7–2.4)

## 2017-02-04 MED ORDER — SODIUM PHOSPHATES 45 MMOLE/15ML IV SOLN
15.0000 mmol | Freq: Once | INTRAVENOUS | Status: AC
  Administered 2017-02-04: 15 mmol via INTRAVENOUS
  Filled 2017-02-04: qty 5

## 2017-02-04 MED ORDER — ENOXAPARIN SODIUM 30 MG/0.3ML ~~LOC~~ SOLN
30.0000 mg | SUBCUTANEOUS | Status: DC
  Administered 2017-02-04: 30 mg via SUBCUTANEOUS
  Filled 2017-02-04: qty 0.3

## 2017-02-04 MED ORDER — FUROSEMIDE 10 MG/ML IJ SOLN
80.0000 mg | Freq: Once | INTRAMUSCULAR | Status: AC
  Administered 2017-02-04: 80 mg via INTRAVENOUS
  Filled 2017-02-04: qty 8

## 2017-02-04 MED ORDER — FREE WATER
100.0000 mL | Freq: Three times a day (TID) | Status: DC
  Administered 2017-02-04 – 2017-02-06 (×6): 100 mL

## 2017-02-04 MED ORDER — FAMOTIDINE 20 MG PO TABS
20.0000 mg | ORAL_TABLET | Freq: Every day | ORAL | Status: DC
  Administered 2017-02-05: 20 mg
  Filled 2017-02-04: qty 1

## 2017-02-04 MED ORDER — LEVOTHYROXINE SODIUM 50 MCG PO TABS
150.0000 ug | ORAL_TABLET | Freq: Every day | ORAL | Status: DC
  Administered 2017-02-04 – 2017-02-05 (×2): 150 ug
  Filled 2017-02-04: qty 1

## 2017-02-04 NOTE — Progress Notes (Signed)
Uneventful day. Given 2 doses of Lasix 80 mgs. IVP. Husband and son in and out. Has remained on spontaneous at 50% -5 15. Remains off of sedation. Weaned off Levophed.

## 2017-02-04 NOTE — Progress Notes (Signed)
*  PRELIMINARY RESULTS* Echocardiogram 2D Echocardiogram has been performed.  Cristela BlueHege, Ryna Beckstrom 02/04/2017, 9:17 AM

## 2017-02-04 NOTE — Progress Notes (Signed)
PULMONARY / CRITICAL CARE MEDICINE   Name: Jessica [name] MRN: [id number] DOB: 1945-07-12    ADMISSION DATE:  01/15/2017 CONSULTATION DATE:  10/24  PT PROFILE:   88 F with hx of spinal stenosis - bed-bound X 12 yrs, psychotic depression, neurogenic bladder, recurrent UTI, obesity, frequent hospitalizations over past 5 months admitted to hospitalist service from home 10/05 with AMS, suspected PNA, pleural effusion, pericardial effusion. Complicated hospitalization. Transferred to ICU 10/23 PM with acute hypercarbic respiratory failure requiring intubation.  MAJOR EVENTS/TEST RESULTS: 10/05 Admission as above 10/05 CT chest: Large pericardial effusion. Moderate left pleural effusion with near total collapse of the left lower lobe. Small right pleural effusion with mild right basilar atelectasis 10/06 Cardiology consultation for pericardial effusion - no intervention planned 10/06 Echocardiogram: LVEF 55-60%.  Moderate to large pericardial effusion.  Equivocal findings of tamponade physiology 10/08 L thoracentesis. 375 cc amber fluid. LDH 173, protein 3.8, WBC 1281 - 33 segs, 44 lymphs, 23 monos 10/09 Neurology consultation for AMS/delirium. Deemed TME. Recommended holding sedatives 10/09 CT head: Chronic ischemic microangiopathy without acute intracranial abnormality 10/10 Psychiatry consultation: Severe recurrent major depression with psychotic features.  Recommended discontinuation of Abilify, initiate Zyprexa 5 mg nightly, taper Effexor to off, initiate mirtazapine 10/12 ID consultation: Discontinue vancomycin/cefepime.  Initiate fluconazole for candiduria 10/12 CT renal stone: Moderate bilateral hydroureteronephrosis to the level of the ureterovesical junctions bilaterally. Layering small stones throughout the right renal collecting system and pelvic segment of the right ureter. Stones do not appear to be obstructing. Mild diffuse bladder wall thickening, suggesting acute cystitis. Punctate  layering bladder stones 10/12 Urology consultation for bilateral hydronephrosis.  Felt to be due to neurogenic bladder.  Renal, bladder and ureteral stones noted but felt to be nonobstructing.  Left lower pole renal mass noted.  Indeterminate on noncontrast CT scan.  No further evaluation warranted at this time. 10/14 MRI brain: No acute intracranial abnormality. Mild to moderate for age nonspecific cerebral white matter signal changes, most commonly due to chronic small vessel disease 10/15 Renal US: Mild right-sided hydronephrosis with multiple stones. No definite left-sided stones. The previously demonstrated exophytic slightly hyperdense focus arising from the lower pole of the left kidney is not evident  10/15 WOC consultation: Severe recurrent major depression with psychotic features.  Wound care recommendations made 10/23 Increased somnolence. ABG 7.33/80/82. Transferred to ICU/SDU and intubated in early AM 10/24 10/24 Renal US: Stable right renal calculi. No obstructive changes are noted 10/24 repeat echocardiogram:  10/24 Responsive and F/C. On low dose phenylephrine.  10/25 RASS 0, + F/C. Uo slightly improved. Failed SBT. PSV as tolerated  INDWELLING DEVICES:: ETT 10/24 >>  R IJ CVL 10/24 >>   MICRO DATA: MRSA PCR 10/08 >> NEG Urine 10/05 >> >100,000 candida parapsilosis Pleural fluid 10/08 >> NEG Blood 10/05 >> NEG  Urine 10/10 30k yeast Urine 10/18 40k yeast MRSA PCR 10/23 >> NEG Blood 10/24 >>  Resp 10/24 >>   ANTIMICROBIALS:  Vanc 10/05 >> 10/12 Cefepime 10/05 >> 10/12 Fluconazole 10/05 >> 10/19 Ampho B 10/19 >> 10/24   SUBJECTIVE:  RASS 0. NAD.  Intubated, sedated.  Low-dose phenylephrine.  + F/C  VITAL SIGNS: BP 126/63   Pulse 92   Temp 98.1 F (36.7 C)   Resp 20   Ht 5\' 10"  (1.778 m)   Wt 87.3 kg (192 lb 7.4 oz)   SpO2 91%   BMI 27.62 kg/m   HEMODYNAMICS:    VENTILATOR SETTINGS: Vent Mode: PSV FiO2 (%):  [  45 %-50 %] 50 % Set Rate:  [15 bmp] 15  bmp Vt Set:  [500 mL] 500 mL PEEP:  [5 cmH20] 5 cmH20 Pressure Support:  [15 cmH20] 15 cmH20  INTAKE / OUTPUT: I/O last 3 completed shifts: In: 4898.1 [I.V.:2342.1; NG/GT:1356; IV Piggyback:1200] Out: 425 [Urine:425]  PHYSICAL EXAMINATION: General: Chronically ill-appearing, intubated, RASS 0 Neuro: CNs intact, paraplegia, BUE very weak, DTRs absent in BLE HEENT: NCAT, sclerae white Cardiovascular: reg, muffled heart sounds, no murmur noted Lungs: Bilat breath sounds diminished dependently, no wheezes Abdomen: Obese, soft, NT, + BS Ext: Muscle atrophy in the BLE, warm, diminished distal pulses, no edema Skin: Sacral decubitus ulcer -unstaged  LABS:  BMET  Recent Labs Lab 02/02/17 2255 02/03/17 0507 02/04/17 0457  NA 138 141 135  K 4.4 3.1* 4.8  CL 103 111 104  CO2 28 22 24   BUN 17 15 21*  CREATININE 1.63* 1.37* 1.95*  GLUCOSE 105* 61* 143*    Electrolytes  Recent Labs Lab 02/02/17 0504 02/02/17 2255 02/03/17 0507 02/04/17 0457  CALCIUM 8.2* 8.2* 6.3* 7.6*  MG 2.2  --  1.6* 2.9*  PHOS 5.5*  --   --  1.9*    CBC  Recent Labs Lab 02/02/17 0504 02/02/17 2255 02/04/17 0426  WBC 5.0 5.6 8.0  HGB 10.5* 11.1* 9.1*  HCT 33.7* 35.8 29.2*  PLT 264 269 267    Coag's  Recent Labs Lab 02/02/17 0504  INR 1.15    Sepsis Markers No results for input(s): LATICACIDVEN, PROCALCITON, O2SATVEN in the last 168 hours.  ABG  Recent Labs Lab 02/02/17 2232 02/03/17 0020 02/03/17 0330  PHART 7.15* 7.18* 7.33*  PCO2ART 87* 80* 51*  PO2ART 57* 82* 59*    Liver Enzymes  Recent Labs Lab 02/02/17 0504 02/04/17 0457  AST 20 22  ALT 14 14  ALKPHOS 102 108  BILITOT 0.6 0.6  ALBUMIN 2.0* 1.8*    Cardiac Enzymes No results for input(s): TROPONINI, PROBNP in the last 168 hours.  Glucose  Recent Labs Lab 02/02/17 2251 02/02/17 2312 02/03/17 1548 02/03/17 2330 02/04/17 0113 02/04/17 0721  GLUCAP 95 108* 105* 114* 127* 122*    CXR: NSC  cardiomegaly, moderate bilat pleural effusions  ASSESSMENT / PLAN:  PULMONARY A: Acute hypercarbic respiratory failure Bilateral pleural effusions P:   Cont vent support - settings reviewed and/or adjusted PSV as tolerated Cont vent bundle Daily SBT if/when meets criteria  CARDIOVASCULAR A:  Pericardial effusion -unclear etiology Hypotension P:  Cardiology following F/U echocardiogram performed 10/24 Continue phenylephrine to maintain MIP > 65 mmHg  RENAL A:   AKI -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 Oliguria Hypokalemia P:   Monitor BMET intermittently Monitor I/Os Correct electrolytes as indicated  Lasix X 2 doses 10/25  GASTROINTESTINAL A:   Obesity P:   SUP: enteral famotidine TF protocol initiated 10/24  HEMATOLOGIC A:   Mild anemia without acute blood loss P:  DVT px: enoxaparin Monitor CBC intermittently Transfuse per usual guidelines   INFECTIOUS A:   Candiduria Sacral pressure wound Recent PNA -completed 8 days vanc/cefepime P:   Monitor temp, WBC count Micro and abx as above  ID service following  ENDOCRINE A:   Hypothyroidism P:   Continue levothyroxine  NEUROLOGIC A:   Profound deconditioning @ baseline Spinal stenosis with paraplegia Depression with psychotic features ICU/vent assoc discomfort P:   RASS goal: 0, -1 PAD protocol Holding psych meds for now Will need extensive PT  FAMILY: I will update family later today  CCM time: 35 mins The above time includes time spent in consultation with patient and/or family members and reviewing care plan on multidisciplinary rounds  Billy Fischeravid Sagal Gayton, MD PCCM service Mobile [phone] Pager [phone] 02/04/2017, 12:01 PM

## 2017-02-04 NOTE — Progress Notes (Signed)
SUBJECTIVE: Patient is sedated and intubated   Vitals:   02/04/17 0700 02/04/17 0800 02/04/17 0900 02/04/17 1000  BP: 116/63 113/68 125/66 126/63  Pulse: 84 81 82 92  Resp: 16 20 20 20   Temp: 98.1 F (36.7 C) 98.1 F (36.7 C) 98.1 F (36.7 C)   TempSrc:      SpO2: 92% 96% 92% 91%  Weight:      Height:        Intake/Output Summary (Last 24 hours) at 02/04/17 1147 Last data filed at 02/04/17 0600  Gross per 24 hour  Intake          3243.38 ml  Output              325 ml  Net          2918.38 ml    LABS: Basic Metabolic Panel:  Recent Labs  96/07/5408/23/18 0504  02/03/17 0507 02/04/17 0457  NA 137  < > 141 135  K 3.9  < > 3.1* 4.8  CL 101  < > 111 104  CO2 31  < > 22 24  GLUCOSE 88  < > 61* 143*  BUN 17  < > 15 21*  CREATININE 1.29*  < > 1.37* 1.95*  CALCIUM 8.2*  < > 6.3* 7.6*  MG 2.2  --  1.6* 2.9*  PHOS 5.5*  --   --  1.9*  < > = values in this interval not displayed. Liver Function Tests:  Recent Labs  02/02/17 0504 02/04/17 0457  AST 20 22  ALT 14 14  ALKPHOS 102 108  BILITOT 0.6 0.6  PROT 5.9* 5.3*  ALBUMIN 2.0* 1.8*   No results for input(s): LIPASE, AMYLASE in the last 72 hours. CBC:  Recent Labs  02/02/17 2255 02/04/17 0426  WBC 5.6 8.0  NEUTROABS 4.4  --   HGB 11.1* 9.1*  HCT 35.8 29.2*  MCV 83.6 81.3  PLT 269 267   Cardiac Enzymes: No results for input(s): CKTOTAL, CKMB, CKMBINDEX, TROPONINI in the last 72 hours. BNP: Invalid input(s): POCBNP D-Dimer: No results for input(s): DDIMER in the last 72 hours. Hemoglobin A1C: No results for input(s): HGBA1C in the last 72 hours. Fasting Lipid Panel: No results for input(s): CHOL, HDL, LDLCALC, TRIG, CHOLHDL, LDLDIRECT in the last 72 hours. Thyroid Function Tests:  Recent Labs  02/04/17 0457  TSH 2.853   Anemia Panel: No results for input(s): VITAMINB12, FOLATE, FERRITIN, TIBC, IRON, RETICCTPCT in the last 72 hours.   PHYSICAL EXAM General: Well developed, well nourished, in no  acute distress HEENT:  Normocephalic and atramatic Neck:  No JVD.  Lungs: Clear bilaterally to auscultation and percussion. Heart: HRRR . Normal S1 and S2 without gallops or murmurs.  Abdomen: Bowel sounds are positive, abdomen soft and non-tender  Msk:  Back normal, normal gait. Normal strength and tone for age. Extremities: No clubbing, cyanosis or edema.   Neuro: Alert and oriented X 3. Psych:  Good affect, responds appropriately  TELEMETRY: NSR  ASSESSMENT AND PLAN: Pericardial effusion was evaluated by echo today as had mod-severe effusion 2 weeks ago, and it showed resolution of PE. There was only trivial anterior and posterior PE without hemodynamic compromise. LVEF was normal with diastolic dysfunction. Events noted appear to be respiratory cause leading to intubation. Will continue to follow.  Principal Problem:   Severe recurrent major depression with psychotic features (HCC) Active Problems:   Pneumonia   Subacute delirium   Altered mental status   Right  nephrolithiasis   Oligouria    Laurier Nancy, MD, [facility] 02/04/2017 11:47 AM

## 2017-02-04 NOTE — Progress Notes (Signed)
[employer] [facility]   [name]    MR#:  [id number]  DATE OF BIRTH:  1945-09-24  SUBJECTIVE:   Patient transferred to the intensive care unit overnight due to worsening respiratory failure secondary to pulmonary edema and intubated. Remains intubated this morning. Follows simple commands and is off sedation. Patient developed acute kidney injury secondary to side effects from amphotericin B.  REVIEW OF SYSTEMS:    Review of Systems  Unable to perform ROS: Intubated    Nutrition: Tube feeds Tolerating Diet: Yes Tolerating PT: Await Eval.   DRUG ALLERGIES:   Allergies  Allergen Reactions  . Ambien [Zolpidem Tartrate] Other (See Comments)    Reaction: altered mental status  . Amoxicillin Other (See Comments)    Reaction: blisters in mouth Has patient had a PCN reaction causing immediate rash, facial/tongue/throat swelling, SOB or lightheadedness with hypotension: Yes Has patient had a PCN reaction causing severe rash involving mucus membranes or skin necrosis: No Has patient had a PCN reaction that required hospitalization No Has patient had a PCN reaction occurring within the last 10 years: Yes If all of the above answers are "NO", then may proceed with Cephalosporin use.     VITALS:  Blood pressure 110/61, pulse 81, temperature 97.9 F (36.6 C), resp. rate 18, height 5\' 10"  (1.778 m), weight 87.3 kg (192 lb 7.4 oz), SpO2 94 %.  PHYSICAL EXAMINATION:   Physical Exam  GENERAL:  71 y.o.-year-old obese patient lying in bed intubated and follows simple commands.  EYES: Pupils equal, round, reactive to light and accommodation. No scleral icterus. Extraocular muscles intact.  HEENT: Head atraumatic, normocephalic. ET and OG tubes in place.  NECK:  Supple, no jugular venous distention. No thyroid enlargement, no tenderness.  LUNGS: Normal breath sounds bilaterally, no wheezing, rales, rhonchi. No use of accessory muscles of  respiration.  CARDIOVASCULAR: S1, S2 normal. No murmurs, rubs, or gallops.  ABDOMEN: Soft, nontender, nondistended. Bowel sounds present. No organomegaly or mass.  EXTREMITIES: No cyanosis, clubbing, +2 edema b/l    NEUROLOGIC: Cranial nerves II through XII are intact. No focal Motor or sensory deficits b/l. Globally weak and bedbound  PSYCHIATRIC: The patient is alert and oriented x 1.  SKIN: No obvious rash, lesion, or ulcer.    LABORATORY PANEL:   CBC  Recent Labs Lab 02/04/17 0426  WBC 8.0  HGB 9.1*  HCT 29.2*  PLT 267   ------------------------------------------------------------------------------------------------------------------  Chemistries   Recent Labs Lab 02/04/17 0457  NA 135  K 4.8  CL 104  CO2 24  GLUCOSE 143*  BUN 21*  CREATININE 1.95*  CALCIUM 7.6*  MG 2.9*  AST 22  ALT 14  ALKPHOS 108  BILITOT 0.6   ------------------------------------------------------------------------------------------------------------------  Cardiac Enzymes No results for input(s): TROPONINI in the last 168 hours. ------------------------------------------------------------------------------------------------------------------  RADIOLOGY:  Dg Abd 1 View  Result Date: 02/03/2017 CLINICAL DATA:  Orogastric tube placement.  Initial encounter. EXAM: ABDOMEN - 1 VIEW COMPARISON:  CT of the abdomen and pelvis performed 01/22/2017 FINDINGS: The patient's enteric tube is noted ending overlying the antrum of the stomach. The visualized bowel gas pattern is grossly unremarkable. Small bilateral pleural effusions are noted, with bibasilar airspace opacities. No acute osseous abnormalities are seen. IMPRESSION: 1. Enteric tube noted ending overlying the antrum of the stomach. 2. Small bilateral pleural effusions, with bibasilar airspace opacities. Electronically Signed   By: Roanna Raider M.D.   On: 02/03/2017 02:11   US Renal  Result Date: 02/03/2017 CLINICAL DATA:  Oliguria  EXAM: RENAL / URINARY TRACT ULTRASOUND COMPLETE COMPARISON:  01/25/2017 FINDINGS: Right Kidney: Length: 10.8 cm. Multiple echogenic foci are noted throughout the right kidney consistent with nonobstructing calculi. The overall appearance is stable from the prior study. No hydronephrosis is identified on today's exam. Left Kidney: Length: 10.6 cm. The known exophytic lesion arising from the lower pole of the left kidney is not well appreciated on today's exam. Bladder: The bladder is decompressed. IMPRESSION: Stable right renal calculi. No obstructive changes are noted. Nonvisualization of previously seen exophytic lesion from the left kidney. Electronically Signed   By: Alcide CleverMark  Lukens M.D.   On: 02/03/2017 14:57   Dg Chest Port 1 View  Result Date: 02/04/2017 CLINICAL DATA:  Respiratory failure EXAM: PORTABLE CHEST 1 VIEW COMPARISON:  02/03/2017 FINDINGS: Endotracheal tube tip is 2 cm above the carina. Right IJ central line with tip at the upper cavoatrial junction. An orogastric tube reaches the stomach. Haziness of the lower chest from moderate layering effusions. The underlying lungs are atelectatic if not consolidated. Stable cardiopericardial size. There is pericardial effusion by CT 01/22/2017. IMPRESSION: 1. Stable positioning of tubes and central line. 2. Moderate pleural effusions with atelectasis or consolidation in the lower lungs. 3. Recently seen pericardial effusion. Stable cardiopericardial size. Electronically Signed   By: Marnee SpringJonathon  Watts M.D.   On: 02/04/2017 07:42   Dg Chest Port 1 View  Result Date: 02/03/2017 CLINICAL DATA:  Endotracheal tube placement.  Initial encounter. EXAM: PORTABLE CHEST 1 VIEW COMPARISON:  Chest radiograph performed 02/02/2017 FINDINGS: The endotracheal tube is seen ending 3 cm above the carina. An enteric tube is noted extending below the diaphragm. A right IJ line is noted ending about the cavoatrial junction. Small bilateral pleural effusions are noted.  Bibasilar airspace opacities raise concern for pulmonary edema. No pneumothorax is seen. The cardiomediastinal silhouette is borderline normal in size. No acute osseous abnormalities are identified. IMPRESSION: 1. Endotracheal tube seen ending 3 cm above the carina. 2. Small bilateral pleural effusions. Bibasilar airspace opacities raise concern for pulmonary edema. Electronically Signed   By: Roanna RaiderJeffery  Chang M.D.   On: 02/03/2017 02:10   Dg Chest Port 1 View  Result Date: 02/02/2017 CLINICAL DATA:  Oxygen desaturation EXAM: PORTABLE CHEST 1 VIEW COMPARISON:  01/27/2017 FINDINGS: Moderate bilateral pleural effusions. Cardiomegaly with vascular congestion and underlying pulmonary edema. Bibasilar consolidations. IMPRESSION: 1. Moderate bilateral pleural effusions, increased compared to prior radiograph 2. Cardiomegaly with vascular congestion and pulmonary edema. 3. Bibasilar atelectasis or pneumonia, increased compared to prior Electronically Signed   By: Jasmine PangKim  Fujinaga M.D.   On: 02/02/2017 22:32     ASSESSMENT AND PLAN:   71 year old female with past medical history of morbid obesity, hypertension, hyperlipidemia, recurrent urinary tract infections, spinal stenosis, chronic pain who presented to the hospital due to altered mental status initially.  1. Acute respiratory failure with hypoxia and hypercapnia-secondary to volume overload from pulmonary edema. Patient developed pulmonary edema secondary to acute kidney injury from side effects from amphotericin B. - currently intubated and sedated and continue diuresis with IV Lasix. Wean off ventilator as per intensivist.  2. Hypotension-etiology unclear, suspected to be secondary to the underlying acute kidney injury and CHF. -Continue phenylephrine to keep maps greater than 65. Wean off vasopressors as tolerated.  3. AMS/Encephalopathy - etiology unclear. Initially thought to be metabolic encephalopathy in the setting of pneumonia and  depression. -Seen by neurology and underwent a neurologic workup including CT head, MRI  brain which are negative. -Continue to hold sedative meds for now follow mental status once patient is active extubated. Next  4. History of HCAP With pleural effusions-status post thoracentesis. Patient has completed IV antibiotic therapy with vancomycin, cefepime. No acute issue presently.  5. Recurrent urinary tract infection-patient has had funguria. Seen by infectious disease and started on amphotericin B but patient developed acute kidney injury secondary to it. Patient apparently as per infectious disease has been adequately treated, and her respiratory decline is not the result of her candiduria. -Continue further care as per infectious disease once patient is extubated.  6. Essential hypertension-blood pressure meds on hold due to severe hypotension presently.  7. Pericardial Effusion -this was incidentally noted on admission. Echocardiogram showed moderate pericardial effusion but she has no evidence of pericardial tamponade. Seen by cardiology and no plans for present intervention.  8. Nephrolithiasis with candiduria-seen by urology and patient has small nonobstructive right sided stones. Plan was for elective ureteroscopy yesterday but due to patient's respiratory status it was canceled. -Continue supportive care for now. Renal ultrasound obtained yesterday showing no hydronephrosis and stable right-sided nephrolithiasis. Likely will need outpatient urologic follow-up upon discharge.  9. Hypothyroidism-continue Synthroid.  10. Sacral decubitus ulcer-continue local wound care.  All the records are reviewed and case discussed with Care Management/Social Worker. Management plans discussed with the patient, family and they are in agreement.  CODE STATUS: Full code  DVT Prophylaxis: Lovenox  TOTAL TIME TAKING CARE OF THIS PATIENT: 30 minutes.   POSSIBLE D/C IN 2-3 DAYS, DEPENDING ON CLINICAL  CONDITION.   Houston Siren M.D on 02/04/2017 at 4:22 PM  Between 7am to 6pm - Pager - [phone]  After 6pm go to [email or web address] - Therapist, nutritional Hospitalists  Office  [phone]  CC: Primary care physician; Center, [facility]

## 2017-02-05 ENCOUNTER — Inpatient Hospital Stay: Payer: Medicare Other

## 2017-02-05 DIAGNOSIS — J9601 Acute respiratory failure with hypoxia: Secondary | ICD-10-CM

## 2017-02-05 LAB — BASIC METABOLIC PANEL
Anion gap: 5 (ref 5–15)
BUN: 23 mg/dL — ABNORMAL HIGH (ref 6–20)
CO2: 27 mmol/L (ref 22–32)
Calcium: 7.7 mg/dL — ABNORMAL LOW (ref 8.9–10.3)
Chloride: 102 mmol/L (ref 101–111)
Creatinine, Ser: 1.62 mg/dL — ABNORMAL HIGH (ref 0.44–1.00)
GFR calc Af Amer: 36 mL/min — ABNORMAL LOW (ref >60)
GFR calc non Af Amer: 31 mL/min — ABNORMAL LOW (ref >60)
Glucose, Bld: 126 mg/dL — ABNORMAL HIGH (ref 65–99)
Potassium: 4.4 mmol/L (ref 3.5–5.1)
Sodium: 134 mmol/L — ABNORMAL LOW (ref 135–145)

## 2017-02-05 LAB — CBC
HCT: 28.2 % — ABNORMAL LOW (ref 35.0–47.0)
Hemoglobin: 9 g/dL — ABNORMAL LOW (ref 12.0–16.0)
MCH: 25.8 pg — ABNORMAL LOW (ref 26.0–34.0)
MCHC: 31.8 g/dL — ABNORMAL LOW (ref 32.0–36.0)
MCV: 81.1 fL (ref 80.0–100.0)
Platelets: 210 10*3/uL (ref 150–440)
RBC: 3.48 MIL/uL — ABNORMAL LOW (ref 3.80–5.20)
RDW: 16.1 % — ABNORMAL HIGH (ref 11.5–14.5)
WBC: 6.5 10*3/uL (ref 3.6–11.0)

## 2017-02-05 LAB — PHOSPHORUS: Phosphorus: 2.3 mg/dL — ABNORMAL LOW (ref 2.5–4.6)

## 2017-02-05 LAB — CULTURE, RESPIRATORY

## 2017-02-05 LAB — URINALYSIS, COMPLETE (UACMP) WITH MICROSCOPIC
Bilirubin Urine: NEGATIVE
Glucose, UA: NEGATIVE mg/dL
Hgb urine dipstick: NEGATIVE
Ketones, ur: NEGATIVE mg/dL
Nitrite: NEGATIVE
Protein, ur: NEGATIVE mg/dL
Specific Gravity, Urine: 1.005 (ref 1.005–1.030)
Squamous Epithelial / LPF: NONE SEEN
pH: 5 (ref 5.0–8.0)

## 2017-02-05 LAB — CULTURE, RESPIRATORY W GRAM STAIN: Culture: NORMAL

## 2017-02-05 LAB — GLUCOSE, CAPILLARY
Glucose-Capillary: 126 mg/dL — ABNORMAL HIGH (ref 65–99)
Glucose-Capillary: 130 mg/dL — ABNORMAL HIGH (ref 65–99)
Glucose-Capillary: 131 mg/dL — ABNORMAL HIGH (ref 65–99)

## 2017-02-05 LAB — MAGNESIUM: Magnesium: 2.2 mg/dL (ref 1.7–2.4)

## 2017-02-05 MED ORDER — ENOXAPARIN SODIUM 40 MG/0.4ML ~~LOC~~ SOLN
40.0000 mg | SUBCUTANEOUS | Status: DC
  Administered 2017-02-05 – 2017-02-10 (×5): 40 mg via SUBCUTANEOUS
  Filled 2017-02-05 (×5): qty 0.4

## 2017-02-05 MED ORDER — FUROSEMIDE 10 MG/ML IJ SOLN
60.0000 mg | Freq: Once | INTRAMUSCULAR | Status: AC
  Administered 2017-02-05: 60 mg via INTRAVENOUS
  Filled 2017-02-05: qty 6

## 2017-02-05 NOTE — Progress Notes (Signed)
SUBJECTIVE: patient remains intubatedand sedated and unresponsive.     Vitals:   02/05/17 0500 02/05/17 0700 02/05/17 0741 02/05/17 0800  BP: (!) 108/57 (!) 100/54  (!) 105/55  Pulse: 92 100  98  Resp: (!) 21 20  19   Temp:    98.4 F (36.9 C)  TempSrc:    Oral  SpO2: 94% 95% 96% 95%  Weight:      Height:        Intake/Output Summary (Last 24 hours) at 02/05/17 0850 Last data filed at 02/05/17 0800  Gross per 24 hour  Intake          4060.29 ml  Output             2370 ml  Net          1690.29 ml    LABS: Basic Metabolic Panel:  Recent Labs  16/01/9609/25/18 0457 02/05/17 0421  NA 135 134*  K 4.8 4.4  CL 104 102  CO2 24 27  GLUCOSE 143* 126*  BUN 21* 23*  CREATININE 1.95* 1.62*  CALCIUM 7.6* 7.7*  MG 2.9* 2.2  PHOS 1.9* 2.3*   Liver Function Tests:  Recent Labs  02/04/17 0457  AST 22  ALT 14  ALKPHOS 108  BILITOT 0.6  PROT 5.3*  ALBUMIN 1.8*   No results for input(s): LIPASE, AMYLASE in the last 72 hours. CBC:  Recent Labs  02/02/17 2255 02/04/17 0426 02/05/17 0421  WBC 5.6 8.0 6.5  NEUTROABS 4.4  --   --   HGB 11.1* 9.1* 9.0*  HCT 35.8 29.2* 28.2*  MCV 83.6 81.3 81.1  PLT 269 267 210   Cardiac Enzymes: No results for input(s): CKTOTAL, CKMB, CKMBINDEX, TROPONINI in the last 72 hours. BNP: Invalid input(s): POCBNP D-Dimer: No results for input(s): DDIMER in the last 72 hours. Hemoglobin A1C: No results for input(s): HGBA1C in the last 72 hours. Fasting Lipid Panel: No results for input(s): CHOL, HDL, LDLCALC, TRIG, CHOLHDL, LDLDIRECT in the last 72 hours. Thyroid Function Tests:  Recent Labs  02/04/17 0457  TSH 2.853   Anemia Panel: No results for input(s): VITAMINB12, FOLATE, FERRITIN, TIBC, IRON, RETICCTPCT in the last 72 hours.   PHYSICAL EXAM General: Well developed, well nourished, in no acute distress HEENT:  Normocephalic and atramatic Neck:  No JVD.  Lungs: Clear bilaterally to auscultation and percussion. Heart: HRRR .  Normal S1 and S2 without gallops or murmurs.  Abdomen: Bowel sounds are positive, abdomen soft and non-tender  Msk:  Back normal, normal gait. Normal strength and tone for age. Extremities: No clubbing, cyanosis or edema.   Neuro: Alert and oriented X 3. Psych:  Good affect, responds appropriately  TELEMETRY: sinus rhythm 99   ASSESSMENT AND PLAN patient has bilateral pneumonia and pleural effusion on chest x-ray. Patient has no significant pericardial effusion on echocardiogram done yesterday. Recall patient had pericardial effusion and moderate to large on admission but follow-up echocardiogram shows trivial pericardial effusion without any hemodynamic compromise. Cardiac point of view patient remains in sinus rhythm and LV function was normal. If further assistance is needed cardiac point of view please contact us.  Principal Problem:   Severe recurrent major depression with psychotic features (HCC) Active Problems:   Pneumonia   Subacute delirium   Altered mental status   Right nephrolithiasis   Oligouria    Henslee Lottman A, MD, [facility] 02/05/2017 8:50 AM

## 2017-02-05 NOTE — Progress Notes (Signed)
[employer] - [place] at [facility]   [name]    MR#:  [id number]  DATE OF BIRTH:  [birth year]  SUBJECTIVE:   Patient remains intubated and sedated.  FiO2 down to 35%.  Received another dose of diuretics this morning.  Patient's husband is at bedside.  Off vasopressors now.  REVIEW OF SYSTEMS:    Review of Systems  Unable to perform ROS: Intubated    Nutrition: Tube feeds Tolerating Diet: Yes Tolerating PT: Await Eval.   DRUG ALLERGIES:   Allergies  Allergen Reactions  . Ambien [Zolpidem Tartrate] Other (See Comments)    Reaction: altered mental status  . Amoxicillin Other (See Comments)    Reaction: blisters in mouth Has patient had a PCN reaction causing immediate rash, facial/tongue/throat swelling, SOB or lightheadedness with hypotension: Yes Has patient had a PCN reaction causing severe rash involving mucus membranes or skin necrosis: No Has patient had a PCN reaction that required hospitalization No Has patient had a PCN reaction occurring within the last 10 years: Yes If all of the above answers are "NO", then may proceed with Cephalosporin use.     VITALS:  Blood pressure (!) 110/56, pulse (!) 101, temperature 99.6 F (37.6 C), temperature source Oral, resp. rate (!) 23, height 5\' 10"  (1.778 m), weight 89.8 kg (197 lb 15.6 oz), SpO2 91 %.  PHYSICAL EXAMINATION:   Physical Exam  GENERAL:  71 y.o.-year-old obese patient lying in bed intubated and follows simple commands.  EYES: Pupils equal, round, reactive to light and accommodation. No scleral icterus. Extraocular muscles intact.  HEENT: Head atraumatic, normocephalic. ET and OG tubes in place.  NECK:  Supple, no jugular venous distention. No thyroid enlargement, no tenderness.  LUNGS: Normal breath sounds bilaterally, no wheezing, rales, + rhonchi b/l. No use of accessory muscles of respiration.  CARDIOVASCULAR: S1, S2 normal. No murmurs, rubs, or gallops.  ABDOMEN:  Soft, nontender, nondistended. Bowel sounds present. No organomegaly or mass.  EXTREMITIES: No cyanosis, clubbing, +2 edema b/l    NEUROLOGIC: Cranial nerves II through XII are intact. No focal Motor or sensory deficits b/l. Globally weak and bedbound  PSYCHIATRIC: The patient is alert and oriented x 1.  SKIN: No obvious rash, lesion, or ulcer.    LABORATORY PANEL:   CBC  Recent Labs Lab 02/05/17 0421  WBC 6.5  HGB 9.0*  HCT 28.2*  PLT 210   ------------------------------------------------------------------------------------------------------------------  Chemistries   Recent Labs Lab 02/04/17 0457 02/05/17 0421  NA 135 134*  K 4.8 4.4  CL 104 102  CO2 24 27  GLUCOSE 143* 126*  BUN 21* 23*  CREATININE 1.95* 1.62*  CALCIUM 7.6* 7.7*  MG 2.9* 2.2  AST 22  --   ALT 14  --   ALKPHOS 108  --   BILITOT 0.6  --    ------------------------------------------------------------------------------------------------------------------  Cardiac Enzymes No results for input(s): TROPONINI in the last 168 hours. ------------------------------------------------------------------------------------------------------------------  RADIOLOGY:  Dg Chest Port 1 View  Result Date: 02/05/2017 CLINICAL DATA:  Respiratory failure. EXAM: PORTABLE CHEST 1 VIEW COMPARISON:  02/04/2017 .  CT 01/15/2017. FINDINGS: Endotracheal tube, NG tube, right IJ line stable position. Cardiomegaly unchanged. Recently identified pericardial effusion may remain . Bilateral pulmonary infiltrates/edema and bilateral pleural effusions. No pneumothorax. IMPRESSION: 1. Lines and tubes in stable position. 2. Persistent cardiomegaly without interim change. Pericardial effusion recently identified. Bilateral pulmonary infiltrates/ edema and bilateral pleural effusions again noted without interim change. Electronically Signed   By: Maisie Fus  Register  On: 02/05/2017 06:31   Dg Chest Port 1 View  Result Date:  02/04/2017 CLINICAL DATA:  Respiratory failure EXAM: PORTABLE CHEST 1 VIEW COMPARISON:  02/03/2017 FINDINGS: Endotracheal tube tip is 2 cm above the carina. Right IJ central line with tip at the upper cavoatrial junction. An orogastric tube reaches the stomach. Haziness of the lower chest from moderate layering effusions. The underlying lungs are atelectatic if not consolidated. Stable cardiopericardial size. There is pericardial effusion by CT 01/22/2017. IMPRESSION: 1. Stable positioning of tubes and central line. 2. Moderate pleural effusions with atelectasis or consolidation in the lower lungs. 3. Recently seen pericardial effusion. Stable cardiopericardial size. Electronically Signed   By: Marnee SpringJonathon  Watts M.D.   On: 02/04/2017 07:42     ASSESSMENT AND PLAN:   71 year old female with past medical history of morbid obesity, hypertension, hyperlipidemia, recurrent urinary tract infections, spinal stenosis, chronic pain who presented to the hospital due to altered mental status initially.  1. Acute respiratory failure with hypoxia and hypercapnia-secondary to volume overload from pulmonary edema. Patient developed pulmonary edema secondary to acute kidney injury from side effects from amphotericin B. - currently intubated and sedated and continue diuresis with IV Lasix.  -Responding well to IV diuretics.  Continue to wean off ventilator as per intensivist.  FiO2 down to 35%.  2. Hypotension- improved, patient off vasopressors now.  3. AMS/Encephalopathy - etiology unclear. Initially thought to be metabolic encephalopathy in the setting of pneumonia and depression. -Seen by neurology and underwent a neurologic workup including CT head, MRI brain which are negative. -Continue to hold sedative meds for now follow mental status once patient is active extubated.   4. History of HCAP With pleural effusions-status post thoracentesis. Patient has completed IV antibiotic therapy with vancomycin, cefepime.  No acute issue presently.  5. Recurrent urinary tract infection-patient has had funguria. Seen by infectious disease and started on amphotericin B but patient developed acute kidney injury secondary to it. - as per infectious disease has been adequately treated, and her respiratory decline is not due to her candiduria. -Continue further care as per infectious disease once patient is extubated.  6. Essential hypertension-blood pressure meds on hold due to relative hypotension presently.  7. Pericardial Effusion -this was incidentally noted on admission. Echocardiogram showed moderate pericardial effusion but she has no evidence of pericardial tamponade. Seen by cardiology and no plans for present intervention.  8. Nephrolithiasis with candiduria-seen by urology and patient has small nonobstructive right sided stones. Plan was for elective ureteroscopy yesterday but due to patient's respiratory status it was canceled. -Continue supportive care for now. Renal ultrasound obtained yesterday showing no hydronephrosis and stable right-sided nephrolithiasis. Likely will need outpatient urologic follow-up upon discharge.  9. Hypothyroidism-continue Synthroid.  10. Sacral decubitus ulcer-continue local wound care.  All the records are reviewed and case discussed with Care Management/Social Worker. Management plans discussed with the patient, family and they are in agreement.  CODE STATUS: Full code  DVT Prophylaxis: Lovenox  TOTAL TIME TAKING CARE OF THIS PATIENT: 30 minutes.   POSSIBLE D/C IN 2-3 DAYS, DEPENDING ON CLINICAL CONDITION.   Houston SirenSAINANI,VIVEK J M.D on 02/05/2017 at 3:55 PM  Between 7am to 6pm - Pager - [phone]  After 6pm go to [email or web address] - Therapist, nutritionalpassword [email or web address]  [facility] [place] [facility]  Office  [phone]  CC: Primary care physician; [facility]

## 2017-02-05 NOTE — Progress Notes (Signed)
PULMONARY / CRITICAL CARE MEDICINE   Name: Jessica [name] MRN: [id number] DOB: August 09, 1945    ADMISSION DATE:  01/15/2017 CONSULTATION DATE:  10/24  PT PROFILE:   18 F with hx of spinal stenosis - bed-bound X 12 yrs, psychotic depression, neurogenic bladder, recurrent UTI, obesity, frequent hospitalizations over past 5 months admitted to hospitalist service from home 10/05 with AMS, suspected PNA, pleural effusion, pericardial effusion. Complicated hospitalization. Transferred to ICU 10/23 PM with acute hypercarbic respiratory failure requiring intubation.  MAJOR EVENTS/TEST RESULTS: 10/05 Admission as above 10/05 CT chest: Large pericardial effusion. Moderate left pleural effusion with near total collapse of the left lower lobe. Small right pleural effusion with mild right basilar atelectasis 10/06 Cardiology consultation for pericardial effusion - no intervention planned 10/06 Echocardiogram: LVEF 55-60%.  Moderate to large pericardial effusion.  Equivocal findings of tamponade physiology 10/08 L thoracentesis. 375 cc amber fluid. LDH 173, protein 3.8, WBC 1281 - 33 segs, 44 lymphs, 23 monos 10/09 Neurology consultation for AMS/delirium. Deemed TME. Recommended holding sedatives 10/09 CT head: Chronic ischemic microangiopathy without acute intracranial abnormality 10/10 Psychiatry consultation: Severe recurrent major depression with psychotic features.  Recommended discontinuation of Abilify, initiate Zyprexa 5 mg nightly, taper Effexor to off, initiate mirtazapine 10/12 ID consultation: Discontinue vancomycin/cefepime.  Initiate fluconazole for candiduria 10/12 CT renal stone: Moderate bilateral hydroureteronephrosis to the level of the ureterovesical junctions bilaterally. Layering small stones throughout the right renal collecting system and pelvic segment of the right ureter. Stones do not appear to be obstructing. Mild diffuse bladder wall thickening, suggesting acute cystitis. Punctate  layering bladder stones 10/12 Urology consultation for bilateral hydronephrosis.  Felt to be due to neurogenic bladder.  Renal, bladder and ureteral stones noted but felt to be nonobstructing.  Left lower pole renal mass noted.  Indeterminate on noncontrast CT scan.  No further evaluation warranted at this time. 10/14 MRI brain: No acute intracranial abnormality. Mild to moderate for age nonspecific cerebral white matter signal changes, most commonly due to chronic small vessel disease 10/15 Renal US: Mild right-sided hydronephrosis with multiple stones. No definite left-sided stones. The previously demonstrated exophytic slightly hyperdense focus arising from the lower pole of the left kidney is not evident  10/15 WOC consultation: Severe recurrent major depression with psychotic features.  Wound care recommendations made 10/23 Increased somnolence. ABG 7.33/80/82. Transferred to ICU/SDU and intubated in early AM 10/24 10/24 Renal US: Stable right renal calculi. No obstructive changes are noted 10/24 repeat echocardiogram:  10/24 Responsive and F/C. On low dose phenylephrine.  10/25 RASS 0, + F/C. Uo slightly improved. Failed SBT. PSV as tolerated 10/26 patient remains intubated sedated full vent support  INDWELLING DEVICES:: ETT 10/24 >>  R IJ CVL 10/24 >>   MICRO DATA: MRSA PCR 10/08 >> NEG Urine 10/05 >> >100,000 candida parapsilosis Pleural fluid 10/08 >> NEG Blood 10/05 >> NEG  Urine 10/10 30k yeast Urine 10/18 40k yeast MRSA PCR 10/23 >> NEG Blood 10/24 >>  Resp 10/24 >>   ANTIMICROBIALS:  Vanc 10/05 >> 10/12 Cefepime 10/05 >> 10/12 Fluconazole 10/05 >> 10/19 Ampho B 10/19 >> 10/24   SUBJECTIVE:  Patient remains intubated and sedated Remains critically ill Patient failed spontaneous breathing trial yesterday We will try again today  VITAL SIGNS: BP (!) 100/54   Pulse 100   Temp 98.6 F (37 C) (Axillary)   Resp 20   Ht 5\' 10"  (1.778 m)   Wt 197 lb 15.6 oz (89.8  kg)   SpO2 96%  BMI 28.41 kg/m   HEMODYNAMICS:    VENTILATOR SETTINGS: Vent Mode: PSV FiO2 (%):  [45 %-50 %] 50 % Set Rate:  [15 bmp] 15 bmp Vt Set:  [500 mL] 500 mL PEEP:  [5 cmH20] 5 cmH20 Pressure Support:  [15 cmH20] 15 cmH20  INTAKE / OUTPUT: I/O last 3 completed shifts: In: 5652.8 [I.V.:3037.8; NG/GT:2360; IV Piggyback:255] Out: 2670 [Urine:2670]  PHYSICAL EXAMINATION: General: Chronically ill-appearing, intubated, RASS 0 Neuro: CNs intact, paraplegia, BUE very weak, DTRs absent in BLE HEENT: NCAT, sclerae white Cardiovascular: reg, muffled heart sounds, no murmur noted Lungs: Bilat breath sounds diminished dependently, no wheezes Abdomen: Obese, soft, NT, + BS Ext: Muscle atrophy in the BLE, warm, diminished distal pulses, no edema Skin: Sacral decubitus ulcer -unstaged  LABS:  BMET  Recent Labs Lab 02/03/17 0507 02/04/17 0457 02/05/17 0421  NA 141 135 134*  K 3.1* 4.8 4.4  CL 111 104 102  CO2 22 24 27   BUN 15 21* 23*  CREATININE 1.37* 1.95* 1.62*  GLUCOSE 61* 143* 126*    Electrolytes  Recent Labs Lab 02/02/17 0504  02/03/17 0507 02/04/17 0457 02/05/17 0421  CALCIUM 8.2*  < > 6.3* 7.6* 7.7*  MG 2.2  --  1.6* 2.9* 2.2  PHOS 5.5*  --   --  1.9* 2.3*  < > = values in this interval not displayed.  CBC  Recent Labs Lab 02/02/17 2255 02/04/17 0426 02/05/17 0421  WBC 5.6 8.0 6.5  HGB 11.1* 9.1* 9.0*  HCT 35.8 29.2* 28.2*  PLT 269 267 210    Coag's  Recent Labs Lab 02/02/17 0504  INR 1.15    Sepsis Markers No results for input(s): LATICACIDVEN, PROCALCITON, O2SATVEN in the last 168 hours.  ABG  Recent Labs Lab 02/02/17 2232 02/03/17 0020 02/03/17 0330  PHART 7.15* 7.18* 7.33*  PCO2ART 87* 80* 51*  PO2ART 57* 82* 59*    Liver Enzymes  Recent Labs Lab 02/02/17 0504 02/04/17 0457  AST 20 22  ALT 14 14  ALKPHOS 102 108  BILITOT 0.6 0.6  ALBUMIN 2.0* 1.8*    Cardiac Enzymes No results for input(s): TROPONINI,  PROBNP in the last 168 hours.  Glucose  Recent Labs Lab 02/03/17 1548 02/03/17 2330 02/04/17 0113 02/04/17 0721 02/04/17 1618 02/05/17 0027  GLUCAP 105* 114* 127* 122* 140* 130*    CXR: NSC cardiomegaly, moderate bilat pleural effusions  ASSESSMENT / PLAN: 71 year old white female admitted to the ICU for acute and severe respiratory failure most likely from pneumonia with pleural effusion now with progressive respiratory failure and failure to wean from the ventilator    PULMONARY A: Acute hypercarbic respiratory failure-patient remains on full vent support Bilateral pleural effusions P:   Cont vent support - settings reviewed and/or adjusted PSV as tolerated Cont vent bundle Daily SBT if/when meets criteria  CARDIOVASCULAR A:  Pericardial effusion -unclear etiology Hypotension P:  Cardiology following F/U echocardiogram performed 10/24 Continue phenylephrine to maintain MIP > 65 mmHg  RENAL A:   AKI -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 Oliguria Hypokalemia P:   Monitor BMET intermittently Monitor I/Os Correct electrolytes as indicated  Lasix X 2 doses 10/25  GASTROINTESTINAL A:   Obesity P:   SUP: enteral famotidine TF protocol initiated 10/24  HEMATOLOGIC A:   Mild anemia without acute blood loss P:  DVT px: enoxaparin Monitor CBC intermittently Transfuse per usual guidelines   INFECTIOUS A:   Candiduria Sacral pressure wound Recent PNA -completed 8 days vanc/cefepime P:   Monitor temp, WBC  count Micro and abx as above  ID service following  ENDOCRINE A:   Hypothyroidism P:   Continue levothyroxine  NEUROLOGIC A:   Profound deconditioning @ baseline Spinal stenosis with paraplegia Depression with psychotic features ICU/vent assoc discomfort P:   RASS goal: 0, -1 PAD protocol    Critical Care Time devoted to patient care services described in this note is 40 minutes.   Overall, patient is critically ill, prognosis is guarded.   Patient with Multiorgan failure and at high risk for cardiac arrest and death.    Lucie Leather, M.D.  [facility]  Medical Director [facility] Medical Director [facility] Cardio-Pulmonary Department

## 2017-02-05 NOTE — Progress Notes (Signed)
[place] INFECTIOUS DISEASE PROGRESS NOTE Date of Admission:  01/15/2017     ID: Jessica [name] is a 71 y.o. female with  Candiduria Principal Problem:   Severe recurrent major depression with psychotic features ([place]) Active Problems:   Pneumonia   Subacute delirium   Altered mental status   Right nephrolithiasis   Oligouria   Subjective: Still intubated, off sedation. Making good urine.   Off pressors.   ROS  Unable to obtain  Medications:  Antibiotics Given (last 72 hours)    None     . aspirin  81 mg Per Tube Daily  . chlorhexidine gluconate (MEDLINE KIT)  15 mL Mouth Rinse BID  . collagenase   Topical Daily  . enoxaparin (LOVENOX) injection  40 mg Subcutaneous Q24H  . famotidine  20 mg Per Tube Daily  . free water  100 mL Per Tube Q8H  . levothyroxine  150 mcg Per Tube QAC breakfast  . mouth rinse  15 mL Mouth Rinse 10 times per day  . polyethylene glycol  17 g Per Tube Daily  . senna-docusate  2 tablet Per Tube Daily    Objective: Vital signs in last 24 hours: Temp:  [97.9 F (36.6 C)-100.2 F (37.9 C)] 99.6 F (37.6 C) (10/26 1200) Pulse Rate:  [86-114] 101 (10/26 1500) Resp:  [16-23] 23 (10/26 1500) BP: (95-138)/(44-70) 110/56 (10/26 1500) SpO2:  [91 %-98 %] 91 % (10/26 1500) FiO2 (%):  [35 %-50 %] 35 % (10/26 1500) Weight:  [89.8 kg (197 lb 15.6 oz)] 89.8 kg (197 lb 15.6 oz) (10/26 0213) Constitutional: intubated,  HENT: [place]/AT, PERRLA, no scleral icterus Mouth/Throat: Oropharynx is clear and dry. No oropharyngeal exudate.  Cardiovascular: Tachy Pulmonary/Chest: poor effort, bil rhonchi Neck = supple, no nuchal rigidity Abdominal: Soft. obese Bowel sounds are normal.  exhibits no distension. Foley in place -  Lymphadenopathy: no cervical adenopathy. No axillary adenopathy Neurological: intubated Skin: Skin is warm and dry. No rash noted. No erythema.  Psychiatric: intubated  Lab Results  Recent Labs  02/04/17 0426 02/04/17 0457  02/05/17 0421  WBC 8.0  --  6.5  HGB 9.1*  --  9.0*  HCT 29.2*  --  28.2*  NA  --  135 134*  K  --  4.8 4.4  CL  --  104 102  CO2  --  24 27  BUN  --  21* 23*  CREATININE  --  1.95* 1.62*   Microbiology: Results for orders placed or performed during the hospital encounter of 01/15/17  Blood culture (routine x 2)     Status: None   Collection Time: 01/15/17  4:04 PM  Result Value Ref Range Status   Specimen Description BLOOD BLOOD LEFT ARM  Final   Special Requests   Final    BOTTLES DRAWN AEROBIC AND ANAEROBIC Blood Culture adequate volume   Culture NO GROWTH 5 DAYS  Final   Report Status 01/20/2017 FINAL  Final  Blood culture (routine x 2)     Status: None   Collection Time: 01/15/17  4:05 PM  Result Value Ref Range Status   Specimen Description BLOOD LEFT ANTECUBITAL  Final   Special Requests   Final    BOTTLES DRAWN AEROBIC AND ANAEROBIC Blood Culture adequate volume   Culture NO GROWTH 5 DAYS  Final   Report Status 01/20/2017 FINAL  Final  Urine Culture     Status: Abnormal   Collection Time: 01/15/17  8:51 PM  Result Value Ref  Range Status   Specimen Description URINE, CATHETERIZED  Final   Special Requests NONE  Final   Culture (A)  Final    >=100,000 COLONIES/mL CANDIDA PARAPSILOSIS SEE SEPARATE REPORT FOR SUSCEPTIBILITIES Performed at [facility], [address] [place], [place] [address]    Report Status 01/28/2017 FINAL  Final  Body fluid culture     Status: None   Collection Time: 01/18/17  9:10 AM  Result Value Ref Range Status   Specimen Description PLEURAL  Final   Special Requests NONE  Final   Gram Stain   Final    ABUNDANT WBC PRESENT,BOTH PMN AND MONONUCLEAR NO ORGANISMS SEEN    Culture   Final    NO GROWTH 3 DAYS Performed at [facility], [address] [place], [place] [address]    Report Status 01/22/2017 FINAL  Final  MRSA PCR Screening     Status: None   Collection Time: 01/18/17  2:15 PM  Result Value Ref Range Status    MRSA by PCR NEGATIVE NEGATIVE Final    Comment:        The GeneXpert MRSA Assay (FDA approved for NASAL specimens only), is one component of a comprehensive MRSA colonization surveillance program. It is not intended to diagnose MRSA infection nor to guide or monitor treatment for MRSA infections.   Urine Culture     Status: Abnormal   Collection Time: 01/20/17 10:52 PM  Result Value Ref Range Status   Specimen Description URINE, RANDOM  Final   Special Requests Normal  Final   Culture 30,000 COLONIES/mL YEAST (A)  Final   Report Status 01/22/2017 FINAL  Final  Urine Culture     Status: Abnormal   Collection Time: 01/28/17  6:28 PM  Result Value Ref Range Status   Specimen Description URINE, RANDOM  Final   Special Requests NONE  Final   Culture 40,000 COLONIES/mL YEAST (A)  Final   Report Status 01/29/2017 FINAL  Final  MRSA PCR Screening     Status: None   Collection Time: 02/02/17 11:23 PM  Result Value Ref Range Status   MRSA by PCR NEGATIVE NEGATIVE Final    Comment:        The GeneXpert MRSA Assay (FDA approved for NASAL specimens only), is one component of a comprehensive MRSA colonization surveillance program. It is not intended to diagnose MRSA infection nor to guide or monitor treatment for MRSA infections.   Culture, blood (Routine X 2) w Reflex to ID Panel     Status: None (Preliminary result)   Collection Time: 02/03/17  5:38 AM  Result Value Ref Range Status   Specimen Description BLOOD RT ARM  Final   Special Requests   Final    BOTTLES DRAWN AEROBIC ONLY Blood Culture results may not be optimal due to an inadequate volume of blood received in culture bottles   Culture NO GROWTH 2 DAYS  Final   Report Status PENDING  Incomplete  Culture, blood (Routine X 2) w Reflex to ID Panel     Status: None (Preliminary result)   Collection Time: 02/03/17  5:38 AM  Result Value Ref Range Status   Specimen Description BLOOD RT HAND  Final   Special Requests    Final    BOTTLES DRAWN AEROBIC AND ANAEROBIC Blood Culture results may not be optimal due to an inadequate volume of blood received in culture bottles   Culture NO GROWTH 2 DAYS  Final   Report Status  PENDING  Incomplete  Culture, respiratory (NON-Expectorated)     Status: None   Collection Time: 02/03/17  7:42 AM  Result Value Ref Range Status   Specimen Description TRACHEAL ASPIRATE  Final   Special Requests NONE  Final   Gram Stain   Final    FEW WBC PRESENT, PREDOMINANTLY PMN FEW GRAM POSITIVE COCCI    Culture   Final    FEW Consistent with normal respiratory flora. Performed at [facility], [place] [address] [place], [place] [address]    Report Status 02/05/2017 FINAL  Final    Studies/Results: Dg Chest Port 1 View  Result Date: 02/05/2017 CLINICAL DATA:  Respiratory failure. EXAM: PORTABLE CHEST 1 VIEW COMPARISON:  02/04/2017 .  CT 01/15/2017. FINDINGS: Endotracheal tube, NG tube, right IJ line stable position. Cardiomegaly unchanged. Recently identified pericardial effusion may remain . Bilateral pulmonary infiltrates/edema and bilateral pleural effusions. No pneumothorax. IMPRESSION: 1. Lines and tubes in stable position. 2. Persistent cardiomegaly without interim change. Pericardial effusion recently identified. Bilateral pulmonary infiltrates/ edema and bilateral pleural effusions again noted without interim change. Electronically Signed   By: Marcello Moores  Register   On: 02/05/2017 06:31   Dg Chest Port 1 View  Result Date: 02/04/2017 CLINICAL DATA:  Respiratory failure EXAM: PORTABLE CHEST 1 VIEW COMPARISON:  02/03/2017 FINDINGS: Endotracheal tube tip is 2 cm above the carina. Right IJ central line with tip at the upper cavoatrial junction. An orogastric tube reaches the stomach. Haziness of the lower chest from moderate layering effusions. The underlying lungs are atelectatic if not consolidated. Stable cardiopericardial size. There is pericardial effusion by CT 01/22/2017.  IMPRESSION: 1. Stable positioning of tubes and central line. 2. Moderate pleural effusions with atelectasis or consolidation in the lower lungs. 3. Recently seen pericardial effusion. Stable cardiopericardial size. Electronically Signed   By: Monte Fantasia M.D.   On: 02/04/2017 07:42    Assessment/Plan: BETTIE CAPISTRAN is a 71 y.o. female admitted with AMS and found to have pericardial and pleural effusion as well as PNA.  She also has had recurrent UTIs with yeast for about 6 months. Candiduria is unusual in patients without indwelling urinary caths however she has had multiple positive cultures with pyuria noted on UA.  CT did reveal stones and urology had foley placed for possible neurogenic bladder Cx reveals candida parapsilosis. Sensitivities reviewed and reveal resistant to fluconazole but sensitive to amphotericin. Intermediate to voriconazole. Echinocandins do not treat UTIs. She was started on amphotericin but has progressive ARF and decreased UOP. Transferred to unit for hypercarbic resp failure and intubated but pressors off and improving. Has good UOP now and cr improved some.   Recommendations Continue to hold on amphotericin. I think she may have cleared her infection with just the few doses she received.   Repeat UA and UCX Continue critical care management and treatment of renal failure. Thank you very much for the consult. Will follow with you.  Leonel Ramsay   02/05/2017, 4:25 PM

## 2017-02-05 NOTE — Progress Notes (Signed)
Nutrition Follow-up  DOCUMENTATION CODES:   Non-severe (moderate) malnutrition in context of chronic illness  INTERVENTION:  Continue Vital AF 1.2 at 60 ml/hr (1440 ml goal daily volume). Provides 1728 kcal, 108 grams of protein, 1166 ml H2O.  Patient ordered for free water flush of 100 ml Q8hrs. Provides total of 1466 ml H2O including water in tube feeding.  Re-assess tube-feeding if patient is extubated. Will be converted to dobhoff per MD to maintain tube following extubation.  Recommend Replete Phos (2.3)  NUTRITION DIAGNOSIS:   Malnutrition (Moderate) related to chronic illness (inadequate inake, pressure ulcers) as evidenced by mild depletion of body fat, mild depletion of muscle mass. -ongoing  GOAL:   Provide needs based on ASPEN/SCCM guidelines -meeting currently  MONITOR:   Vent status, Labs, Weight trends, TF tolerance, Skin, I & O's  REASON FOR ASSESSMENT:   Low Braden    ASSESSMENT:   Jessica [name] has a PMH of HTN, HLD, morbid obesity, spinal stenosis, recurrent UTIs,  Presents with AMS, PNA, pleural effusion, pericardial effusion  Patient is currently intubated on ventilator support MV: 8.7 L/min Temp (24hrs), Avg:98.3 F (36.8 C), Min:97.9 F (36.6 C), Max:98.6 F (37 C) Propofol: None  Discussed at rounds. Patient to be converted to dobhoff today. On SBTs, weaning, may be extubated soon. Off pressors  OGT tip to stomach  Labs reviewed:  Na 134, BUN/Creatinine 23/1.62, Phos 2.3 Medications reviewed and include:  MIralax, Senokot-S  MAP: [phone],73,72,62,73   Intake/Output Summary (Last 24 hours) at 02/05/17 1404 Last data filed at 02/05/17 1300  Gross per 24 hour  Intake          3971.25 ml  Output             2395 ml  Net          1576.25 ml    Diet Order:     EDUCATION NEEDS:   No education needs identified at this time  Skin:  Skin Assessment: Wound (see comment) (Stg II R ischial tuberosity and R ear, Stg I L ear, DTIs)  Last BM:   02/04/2017 (Type 1)  Height:   Ht Readings from Last 1 Encounters:  01/15/17 5\' 10"  (1.778 m)    Weight:   Wt Readings from Last 1 Encounters:  02/05/17 197 lb 15.6 oz (89.8 kg)    Ideal Body Weight:  68.18 kg  BMI:  Body mass index is 28.41 kg/m.  Estimated Nutritional Needs:   Kcal:  1671.9 (PSU 2003b w/ MSJ 1495, Ve 8.7, Tmax 37)  Protein:  106-124 grams (1.2-1.4 grams/kg)  Fluid:  1.7-2 L/day (25-30 ml/kg IBW)  Jessica AnoWilliam M. Janai Maudlin, MS, RD LDN Inpatient Clinical Dietitian Pager [phone]

## 2017-02-06 DIAGNOSIS — F05 Delirium due to known physiological condition: Secondary | ICD-10-CM

## 2017-02-06 LAB — BASIC METABOLIC PANEL
Anion gap: 7 (ref 5–15)
BUN: 26 mg/dL — ABNORMAL HIGH (ref 6–20)
CO2: 28 mmol/L (ref 22–32)
Calcium: 8.3 mg/dL — ABNORMAL LOW (ref 8.9–10.3)
Chloride: 102 mmol/L (ref 101–111)
Creatinine, Ser: 1.26 mg/dL — ABNORMAL HIGH (ref 0.44–1.00)
GFR calc Af Amer: 49 mL/min — ABNORMAL LOW (ref >60)
GFR calc non Af Amer: 42 mL/min — ABNORMAL LOW (ref >60)
Glucose, Bld: 121 mg/dL — ABNORMAL HIGH (ref 65–99)
Potassium: 4.1 mmol/L (ref 3.5–5.1)
Sodium: 137 mmol/L (ref 135–145)

## 2017-02-06 LAB — GLUCOSE, CAPILLARY
Glucose-Capillary: 113 mg/dL — ABNORMAL HIGH (ref 65–99)
Glucose-Capillary: 120 mg/dL — ABNORMAL HIGH (ref 65–99)

## 2017-02-06 LAB — BLOOD GAS, ARTERIAL
Acid-Base Excess: 7.1 mmol/L — ABNORMAL HIGH (ref 0.0–2.0)
Bicarbonate: 33 mmol/L — ABNORMAL HIGH (ref 20.0–28.0)
FIO2: 0.35
O2 Saturation: 90.8 %
PEEP: 5 cmH2O
Patient temperature: 37
Pressure support: 5 cmH2O
pCO2 arterial: 52 mmHg — ABNORMAL HIGH (ref 32.0–48.0)
pH, Arterial: 7.41 (ref 7.350–7.450)
pO2, Arterial: 60 mmHg — ABNORMAL LOW (ref 83.0–108.0)

## 2017-02-06 LAB — CBC
HCT: 28.5 % — ABNORMAL LOW (ref 35.0–47.0)
Hemoglobin: 9.4 g/dL — ABNORMAL LOW (ref 12.0–16.0)
MCH: 26.6 pg (ref 26.0–34.0)
MCHC: 33.1 g/dL (ref 32.0–36.0)
MCV: 80.3 fL (ref 80.0–100.0)
Platelets: 218 10*3/uL (ref 150–440)
RBC: 3.54 MIL/uL — ABNORMAL LOW (ref 3.80–5.20)
RDW: 15.6 % — ABNORMAL HIGH (ref 11.5–14.5)
WBC: 7.5 10*3/uL (ref 3.6–11.0)

## 2017-02-06 LAB — MAGNESIUM: Magnesium: 1.8 mg/dL (ref 1.7–2.4)

## 2017-02-06 LAB — PHOSPHORUS: Phosphorus: 1.6 mg/dL — ABNORMAL LOW (ref 2.5–4.6)

## 2017-02-06 MED ORDER — ORAL CARE MOUTH RINSE
15.0000 mL | Freq: Two times a day (BID) | OROMUCOSAL | Status: DC
  Administered 2017-02-06 – 2017-02-10 (×9): 15 mL via OROMUCOSAL

## 2017-02-06 MED ORDER — ALPRAZOLAM 0.5 MG PO TABS: 0.5000 mg | ORAL_TABLET | Freq: Three times a day (TID) | ORAL | Status: DC | PRN

## 2017-02-06 MED ORDER — DIAZEPAM 5 MG/ML IJ SOLN
2.5000 mg | Freq: Three times a day (TID) | INTRAMUSCULAR | Status: DC | PRN
  Administered 2017-02-06 – 2017-02-07 (×3): 2.5 mg via INTRAVENOUS
  Filled 2017-02-06 (×7): qty 2

## 2017-02-06 NOTE — Progress Notes (Signed)
[name] asked CH to see his mother who appeared to be doing well. Son told CH that his mother was extubated yesterday and was able to lift her arms, something she could not do before. [name] are pleased by the medical care given to pt. Also, son thanked the chaplain for listening to him and showing concern for his family. CH offered a ministry of presence this AM, and will follow-up with pt and family as needed.   02/06/17 1000  Clinical Encounter Type  Visited With Patient;Patient and family together  Visit Type Follow-up;Spiritual support;Other (Comment)  Referral From Family  Consult/Referral To Chaplain  Spiritual Encounters  Spiritual Needs Prayer;Ritual;Emotional;Other (Comment)

## 2017-02-06 NOTE — Progress Notes (Signed)
PULMONARY / CRITICAL CARE MEDICINE   Name: Jessica [name] MRN: [id number] DOB: Mar 26, 1946    ADMISSION DATE:  01/15/2017 CONSULTATION DATE:  10/24  PT PROFILE:   [age] F with hx of spinal stenosis - bed-bound X 12 yrs, psychotic depression, neurogenic bladder, recurrent UTI, obesity, frequent hospitalizations over past 5 months admitted to hospitalist service from home 10/05 with AMS, suspected PNA, pleural effusion, pericardial effusion. Complicated hospitalization. Transferred to ICU 10/23 PM with acute hypercarbic respiratory failure requiring intubation.  MAJOR EVENTS/TEST RESULTS: 10/05 Admission as above 10/05 CT chest: Large pericardial effusion. Moderate left pleural effusion with near total collapse of the left lower lobe. Small right pleural effusion with mild right basilar atelectasis 10/06 Cardiology consultation for pericardial effusion - no intervention planned 10/06 Echocardiogram: LVEF 55-60%.  Moderate to large pericardial effusion.  Equivocal findings of tamponade physiology 10/08 L thoracentesis. 375 cc amber fluid. LDH 173, protein 3.8, WBC 1281 - 33 segs, 44 lymphs, 23 monos 10/09 Neurology consultation for AMS/delirium. Deemed TME. Recommended holding sedatives 10/09 CT head: Chronic ischemic microangiopathy without acute intracranial abnormality 10/10 Psychiatry consultation: Severe recurrent major depression with psychotic features.  Recommended discontinuation of Abilify, initiate Zyprexa 5 mg nightly, taper Effexor to off, initiate mirtazapine 10/12 ID consultation: Discontinue vancomycin/cefepime.  Initiate fluconazole for candiduria 10/12 CT renal stone: Moderate bilateral hydroureteronephrosis to the level of the ureterovesical junctions bilaterally. Layering small stones throughout the right renal collecting system and pelvic segment of the right ureter. Stones do not appear to be obstructing. Mild diffuse bladder wall thickening, suggesting acute cystitis. Punctate  layering bladder stones 10/12 Urology consultation for bilateral hydronephrosis.  Felt to be due to neurogenic bladder.  Renal, bladder and ureteral stones noted but felt to be nonobstructing.  Left lower pole renal mass noted.  Indeterminate on noncontrast CT scan.  No further evaluation warranted at this time. 10/14 MRI brain: No acute intracranial abnormality. Mild to moderate for age nonspecific cerebral white matter signal changes, most commonly due to chronic small vessel disease 10/15 Renal US: Mild right-sided hydronephrosis with multiple stones. No definite left-sided stones. The previously demonstrated exophytic slightly hyperdense focus arising from the lower pole of the left kidney is not evident  10/15 WOC consultation: Severe recurrent major depression with psychotic features.  Wound care recommendations made 10/23 Increased somnolence. ABG 7.33/80/82. Transferred to ICU/SDU and intubated in early AM 10/24 10/24 Renal US: Stable right renal calculi. No obstructive changes are noted 10/24 repeat echocardiogram:  10/24 Responsive and F/C. On low dose phenylephrine.  10/25 RASS 0, + F/C. Uo slightly improved. Failed SBT. PSV as tolerated 10/26 patient remains intubated sedated full vent support 10/27 patient failed SAT/SBT due to resp muscle fatigue INDWELLING DEVICES:: ETT 10/24 >>  R IJ CVL 10/24 >>   MICRO DATA: MRSA PCR 10/08 >> NEG Urine 10/05 >> >100,000 candida parapsilosis Pleural fluid 10/08 >> NEG Blood 10/05 >> NEG  Urine 10/10 30k yeast Urine 10/18 40k yeast MRSA PCR 10/23 >> NEG Blood 10/24 >>  Resp 10/24 >>   ANTIMICROBIALS:  Vanc 10/05 >> 10/12 Cefepime 10/05 >> 10/12 Fluconazole 10/05 >> 10/19 Ampho B 10/19 >> 10/24  SUBJECTIVE:  Patient remains intubated and sedated Remains critically ill Patient failed spontaneous breathing trial yesterday We will try again today   VITAL SIGNS: BP (!) 119/55   Pulse 81   Temp 99.4 F (37.4 C) (Oral)   Resp 19    Ht 5\' 10"  (1.778 m)   Wt 197 lb 1.5  oz (89.4 kg)   SpO2 95%   BMI 28.28 kg/m   HEMODYNAMICS:    VENTILATOR SETTINGS: Vent Mode: PSV FiO2 (%):  [35 %-50 %] 35 % PEEP:  [5 cmH20] 5 cmH20 Pressure Support:  [15 cmH20] 15 cmH20  INTAKE / OUTPUT: I/O last 3 completed shifts: In: 3517.5 [I.V.:1237.5; NG/GT:2280] Out: 3870 [Urine:3870]  PHYSICAL EXAMINATION: General: Chronically ill-appearing, intubated, RASS 0 Neuro: CNs intact, paraplegia, BUE very weak, DTRs absent in BLE HEENT: NCAT, sclerae white Cardiovascular: reg, muffled heart sounds, no murmur noted Lungs: Bilat breath sounds diminished dependently, no wheezes Abdomen: Obese, soft, NT, + BS Ext: Muscle atrophy in the BLE, warm, diminished distal pulses, no edema Skin: Sacral decubitus ulcer -unstaged  LABS:  BMET  Recent Labs Lab 02/04/17 0457 02/05/17 0421 02/06/17 0437  NA 135 134* 137  K 4.8 4.4 4.1  CL 104 102 102  CO2 24 27 28   BUN 21* 23* 26*  CREATININE 1.95* 1.62* 1.26*  GLUCOSE 143* 126* 121*    Electrolytes  Recent Labs Lab 02/04/17 0457 02/05/17 0421 02/06/17 0437  CALCIUM 7.6* 7.7* 8.3*  MG 2.9* 2.2 1.8  PHOS 1.9* 2.3* 1.6*    CBC  Recent Labs Lab 02/04/17 0426 02/05/17 0421 02/06/17 0437  WBC 8.0 6.5 7.5  HGB 9.1* 9.0* 9.4*  HCT 29.2* 28.2* 28.5*  PLT 267 210 218    Coag's  Recent Labs Lab 02/02/17 0504  INR 1.15    Sepsis Markers No results for input(s): LATICACIDVEN, PROCALCITON, O2SATVEN in the last 168 hours.  ABG  Recent Labs Lab 02/02/17 2232 02/03/17 0020 02/03/17 0330  PHART 7.15* 7.18* 7.33*  PCO2ART 87* 80* 51*  PO2ART 57* 82* 59*    Liver Enzymes  Recent Labs Lab 02/02/17 0504 02/04/17 0457  AST 20 22  ALT 14 14  ALKPHOS 102 108  BILITOT 0.6 0.6  ALBUMIN 2.0* 1.8*    Cardiac Enzymes No results for input(s): TROPONINI, PROBNP in the last 168 hours.  Glucose  Recent Labs Lab 02/04/17 0721 02/04/17 1618 02/05/17 0027  02/05/17 0752 02/05/17 1616 02/06/17 0008  GLUCAP 122* 140* 130* 131* 126* 113*   CXR: NSC cardiomegaly, moderate bilat pleural effusions  ASSESSMENT / PLAN: 71 year old white female admitted to the ICU for acute and severe respiratory failure most likely from pneumonia with pleural effusion now with progressive respiratory failure and failure to wean from the ventilator    PULMONARY A: Acute hypercarbic respiratory failure-patient remains on full vent support Bilateral pleural effusions P:   Cont vent support - settings reviewed and/or adjusted PSV as tolerated Cont vent bundle Daily SBT if/when meets criteria  CARDIOVASCULAR A:  Pericardial effusion -unclear etiology Hypotension P:  Cardiology following F/U echocardiogram performed 10/24 Continue phenylephrine to maintain MIP > 65 mmHg  RENAL A:   AKI -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 Oliguria Hypokalemia P:   Monitor BMET intermittently Monitor I/Os Correct electrolytes as indicated  Lasix X 2 doses 10/25  GASTROINTESTINAL A:   Obesity P:   SUP: enteral famotidine TF protocol initiated 10/24  HEMATOLOGIC A:   Mild anemia without acute blood loss P:  DVT px: enoxaparin Monitor CBC intermittently Transfuse per usual guidelines   INFECTIOUS A:   Candiduria Sacral pressure wound Recent PNA -completed 8 days vanc/cefepime P:   Monitor temp, WBC count Micro and abx as above  ID service following  ENDOCRINE A:   Hypothyroidism P:   Continue levothyroxine  NEUROLOGIC A:   Profound deconditioning @ baseline Spinal stenosis with  paraplegia Depression with psychotic features ICU/vent assoc discomfort P:   RASS goal: 0, -1 PAD protocol    Critical Care Time devoted to patient care services described in this note is 45 minutes.   Overall, patient is critically ill, prognosis is guarded.  Patient with Multiorgan failure and at high risk for cardiac arrest and death.    Lucie Leather, M.D.   [facility]  Medical Director [facility] Medical Director [facility] Cardio-Pulmonary Department

## 2017-02-06 NOTE — Progress Notes (Signed)
Patient extubated and placed on 4lpm [place].  Patient tolerated well. HR 91  Oxygen saturations 91% RR 20

## 2017-02-06 NOTE — Progress Notes (Signed)
[facility] -  at [facility]   [name]    MR#:  [id number]  DATE OF BIRTH:  July 08, 1945  SUBJECTIVE:   Patient awakening this am  Still intubated   REVIEW OF SYSTEMS:    LIMITED ROS due to intubation and slowly awakening  Tolerating Diet:TF on HOLD for planned extubation      DRUG ALLERGIES:   Allergies  Allergen Reactions  . Ambien [Zolpidem Tartrate] Other (See Comments)    Reaction: altered mental status  . Amoxicillin Other (See Comments)    Reaction: blisters in mouth Has patient had a PCN reaction causing immediate rash, facial/tongue/throat swelling, SOB or lightheadedness with hypotension: Yes Has patient had a PCN reaction causing severe rash involving mucus membranes or skin necrosis: No Has patient had a PCN reaction that required hospitalization No Has patient had a PCN reaction occurring within the last 10 years: Yes If all of the above answers are "NO", then may proceed with Cephalosporin use.     VITALS:  Blood pressure 114/60, pulse 88, temperature 99.1 F (37.3 C), temperature source Oral, resp. rate 19, height 5\' 10"  (1.778 m), weight 89.4 kg (197 lb 1.5 oz), SpO2 94 %.  PHYSICAL EXAMINATION:  Constitutional: Appears well-developed and well-nourished. No distress.intubated Follows simple commands HENT: Normocephalic. .Intubated Eyes: Conjunctivae and EOM are normal. PERRLA, no scleral icterus.  Neck: Normal ROM. Neck supple. No JVD. No tracheal deviation. CVS: RRR, S1/S2 +, no murmurs, no gallops, no carotid bruit.  Pulmonary: Effort and breath sounds normal, no stridor, rhonchi, wheezes, rales.  Abdominal: Soft. BS +,  no distension, tenderness, rebound or guarding.  Musculoskeletal: Normal range of motion. 1+ LEE  no tenderness.  Neuro: Alert. CN 2-12 grossly intact. No focal deficits.globally weak Skin: Skin is warm and dry. No rash noted. Psychiatric: flat affect    LABORATORY PANEL:    CBC  Recent Labs Lab 02/06/17 0437  WBC 7.5  HGB 9.4*  HCT 28.5*  PLT 218   ------------------------------------------------------------------------------------------------------------------  Chemistries   Recent Labs Lab 02/04/17 0457  02/06/17 0437  NA 135  < > 137  K 4.8  < > 4.1  CL 104  < > 102  CO2 24  < > 28  GLUCOSE 143*  < > 121*  BUN 21*  < > 26*  CREATININE 1.95*  < > 1.26*  CALCIUM 7.6*  < > 8.3*  MG 2.9*  < > 1.8  AST 22  --   --   ALT 14  --   --   ALKPHOS 108  --   --   BILITOT 0.6  --   --   < > = values in this interval not displayed. ------------------------------------------------------------------------------------------------------------------  Cardiac Enzymes No results for input(s): TROPONINI in the last 168 hours. ------------------------------------------------------------------------------------------------------------------  RADIOLOGY:  Dg Abd 1 View  Result Date: 02/05/2017 CLINICAL DATA:  NG tube placement EXAM: ABDOMEN - 1 VIEW COMPARISON:  02/03/2017 FINDINGS: There are 2 esophageal tubes present, nasogastric tube tip overlies the distal stomach. Feeding tube tip overlies the gastroduodenal junction. Bilateral effusions and bibasilar opacity. Cardiomegaly. IMPRESSION: 1. Newly inserted esophageal tube tip overlies the gastroduodenal junction 2. The previously placed esophageal tube tip overlies the distal stomach 3. Cardiomegaly with barrel effusions and bibasilar consolidation Electronically Signed   By: Jasmine PangKim  Fujinaga M.D.   On: 02/05/2017 17:28   Dg Chest Port 1 View  Result Date: 02/05/2017 CLINICAL DATA:  Respiratory failure. EXAM: PORTABLE CHEST 1 VIEW COMPARISON:  02/04/2017 .  CT 01/15/2017. FINDINGS: Endotracheal tube, NG tube, right IJ line stable position. Cardiomegaly unchanged. Recently identified pericardial effusion may remain . Bilateral pulmonary infiltrates/edema and bilateral pleural effusions. No pneumothorax.  IMPRESSION: 1. Lines and tubes in stable position. 2. Persistent cardiomegaly without interim change. Pericardial effusion recently identified. Bilateral pulmonary infiltrates/ edema and bilateral pleural effusions again noted without interim change. Electronically Signed   By: Maisie Fus  Register   On: 02/05/2017 06:31     ASSESSMENT AND PLAN:   71 year old female with past medical history of morbid obesity, hypertension, hyperlipidemia, recurrent urinary tract infections, spinal stenosis, chronic pain who presented to the hospital due to altered mental status initially.  1. Acute respiratory failure with hypoxia and hypercapnia-secondary to volume overload from pulmonary edema. Patient developed pulmonary edema secondary to acute kidney injury from side effects from amphotericin B. Plan for possible extubation alter today Continue diuresis with IV Lasix.    2. Hypotension: This has improved and she is now off vasopressors.  3. AMS/Encephalopathy: Thought to be metabolic encephalopathy in the setting of pneumonia and depression. Seen by neurology and underwent a neurologic workup including CT head, MRI brain which was all negative. Continue to hold sedative meds for now follow mental status once patient is active extubated.   4. History of HCAP With pleural effusions status post thoracentesis. Patient has completed IV antibiotic therapy with vancomycin and cefepime. No acute issue presently.  5. Recurrent urinary tract infection with funguria. Seen by infectious disease and started on amphotericin B but patient developed acute kidney injury secondary to it. As per infectious disease she has been adequately treated Follow up on repeat urine culture    6. Essential hypertension: Blood pressure meds on hold due to relative hypotension presently.  7. Pericardial Effusion: This was incidentally noted on admission. Echocardiogram showed moderate pericardial effusion but she has no evidence  of pericardial tamponade. Seen by cardiology and no plans for present intervention.  8. Nephrolithiasis with candiduria-seen by urology and patient has small nonobstructive right sided stones. She was planned  for elective ureteroscopy but due to patient's respiratory status it was canceled. She will need outpatient urologic follow-up upon discharge.  9. Hypothyroidism-continue Synthroid.  10. Sacral decubitus ulcer-continue local wound care.  11. 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    Management plans discussed with the patient's son and he is in agreement.  CODE STATUS: FULL  TOTAL TIME TAKING CARE OF THIS PATIENT: 22 minutes.     POSSIBLE D/C ??, DEPENDING ON CLINICAL CONDITION.   Khole Branch M.D on 02/06/2017 at 9:34 AM  Between 7am to 6pm - Pager - [phone] After 6pm go to [email or web address] - Social research officer, government  [facility]  Office  [phone]  CC: Primary care physician; Center, [facility]  Note: This dictation was prepared with Nurse, children's dictation along with smaller phrase technology. Any transcriptional errors that result from this process are unintentional.

## 2017-02-06 NOTE — Plan of Care (Signed)
Problem: Respiratory: Goal: Ability to maintain a clear airway will improve Outcome: Progressing Patient extubated at 0910 this morning to [facility]; no signs of acute respiratory distress  Goal: Pain level will decrease Outcome: Progressing Patient denies complaints of pain or discomfort.

## 2017-02-07 LAB — URINE CULTURE
Culture: NO GROWTH
Special Requests: NORMAL

## 2017-02-07 MED ORDER — LEVOTHYROXINE SODIUM 50 MCG PO TABS: 150.0000 ug | ORAL_TABLET | Freq: Every day | ORAL | Status: DC

## 2017-02-07 MED ORDER — ASPIRIN 81 MG PO CHEW
81.0000 mg | CHEWABLE_TABLET | Freq: Every day | ORAL | Status: DC
  Administered 2017-02-08 – 2017-02-11 (×4): 81 mg via ORAL
  Filled 2017-02-07 (×4): qty 1

## 2017-02-07 MED ORDER — SENNOSIDES-DOCUSATE SODIUM 8.6-50 MG PO TABS
2.0000 | ORAL_TABLET | Freq: Every day | ORAL | Status: DC
  Administered 2017-02-08 – 2017-02-11 (×4): 2 via ORAL
  Filled 2017-02-07 (×4): qty 2

## 2017-02-07 MED ORDER — ACETAMINOPHEN 325 MG PO TABS: 650.0000 mg | ORAL_TABLET | Freq: Four times a day (QID) | ORAL | Status: DC | PRN

## 2017-02-07 MED ORDER — MORPHINE SULFATE (PF) 2 MG/ML IV SOLN
2.0000 mg | Freq: Four times a day (QID) | INTRAVENOUS | Status: DC | PRN
  Administered 2017-02-07 – 2017-02-09 (×6): 2 mg via INTRAVENOUS
  Filled 2017-02-07 (×6): qty 1

## 2017-02-07 MED ORDER — FAMOTIDINE 20 MG PO TABS
20.0000 mg | ORAL_TABLET | Freq: Every day | ORAL | Status: DC
  Administered 2017-02-08 – 2017-02-11 (×4): 20 mg via ORAL
  Filled 2017-02-07 (×4): qty 1

## 2017-02-07 NOTE — Progress Notes (Signed)
PULMONARY / CRITICAL CARE MEDICINE   Name: Jessica [name] MRN: [id number] DOB: 29-Oct-1945    ADMISSION DATE:  01/15/2017 CONSULTATION DATE:  10/24  PT PROFILE:   [age] F with hx of spinal stenosis - bed-bound X 12 yrs, psychotic depression, neurogenic bladder, recurrent UTI, obesity, frequent hospitalizations over past 5 months admitted to hospitalist service from home 10/05 with AMS, suspected PNA, pleural effusion, pericardial effusion. Complicated hospitalization. Transferred to ICU 10/23 PM with acute hypercarbic respiratory failure requiring intubation.  MAJOR EVENTS/TEST RESULTS: 10/05 Admission as above 10/05 CT chest: Large pericardial effusion. Moderate left pleural effusion with near total collapse of the left lower lobe. Small right pleural effusion with mild right basilar atelectasis 10/06 Cardiology consultation for pericardial effusion - no intervention planned 10/06 Echocardiogram: LVEF 55-60%.  Moderate to large pericardial effusion.  Equivocal findings of tamponade physiology 10/08 L thoracentesis. 375 cc amber fluid. LDH 173, protein 3.8, WBC 1281 - 33 segs, 44 lymphs, 23 monos 10/09 Neurology consultation for AMS/delirium. Deemed TME. Recommended holding sedatives 10/09 CT head: Chronic ischemic microangiopathy without acute intracranial abnormality 10/10 Psychiatry consultation: Severe recurrent major depression with psychotic features.  Recommended discontinuation of Abilify, initiate Zyprexa 5 mg nightly, taper Effexor to off, initiate mirtazapine 10/12 ID consultation: Discontinue vancomycin/cefepime.  Initiate fluconazole for candiduria 10/12 CT renal stone: Moderate bilateral hydroureteronephrosis to the level of the ureterovesical junctions bilaterally. Layering small stones throughout the right renal collecting system and pelvic segment of the right ureter. Stones do not appear to be obstructing. Mild diffuse bladder wall thickening, suggesting acute cystitis. Punctate  layering bladder stones 10/12 Urology consultation for bilateral hydronephrosis.  Felt to be due to neurogenic bladder.  Renal, bladder and ureteral stones noted but felt to be nonobstructing.  Left lower pole renal mass noted.  Indeterminate on noncontrast CT scan.  No further evaluation warranted at this time. 10/14 MRI brain: No acute intracranial abnormality. Mild to moderate for age nonspecific cerebral white matter signal changes, most commonly due to chronic small vessel disease 10/15 Renal US: Mild right-sided hydronephrosis with multiple stones. No definite left-sided stones. The previously demonstrated exophytic slightly hyperdense focus arising from the lower pole of the left kidney is not evident  10/15 WOC consultation: Severe recurrent major depression with psychotic features.  Wound care recommendations made 10/23 Increased somnolence. ABG 7.33/80/82. Transferred to ICU/SDU and intubated in early AM 10/24 10/24 Renal US: Stable right renal calculi. No obstructive changes are noted 10/24 repeat echocardiogram:  10/24 Responsive and F/C. On low dose phenylephrine.  10/25 RASS 0, + F/C. Uo slightly improved. Failed SBT. PSV as tolerated 10/26 patient remains intubated sedated full vent support 10/27 patient failed SAT/SBT due to resp muscle fatigue 10/27 successfully extubated  INDWELLING DEVICES:: ETT 10/24 >> 10/27 R IJ CVL 10/24 >>   MICRO DATA: MRSA PCR 10/08 >> NEG Urine 10/05 >> >100,000 candida parapsilosis Pleural fluid 10/08 >> NEG Blood 10/05 >> NEG  Urine 10/10 30k yeast Urine 10/18 40k yeast MRSA PCR 10/23 >> NEG Blood 10/24 >> NG Resp 10/24 >> NG  ANTIMICROBIALS:  Vanc 10/05 >> 10/12 Cefepime 10/05 >> 10/12 Fluconazole 10/05 >> 10/19 Ampho B 10/19 >> 10/24  SUBJECTIVE:  Patient is alert awake following commands no respiratory distress Patient is ready to transfer out of the ICU today   VITAL SIGNS: BP 130/63   Pulse 82   Temp (!) 97.5 F (36.4 C)  (Axillary)   Resp 18   Ht 5\' 10"  (1.778 m)  Wt 197 lb 1.5 oz (89.4 kg)   SpO2 93%   BMI 28.28 kg/m   HEMODYNAMICS:     INTAKE / OUTPUT: I/O last 3 completed shifts: In: 870 [NG/GT:870] Out: 2275 [Urine:2275]  PHYSICAL EXAMINATION: General: Chronically ill-appearing no apparent distress Neuro: CNs intact, paraplegia, BUE very weak, DTRs absent in BLE HEENT: NCAT, sclerae white Cardiovascular: reg, muffled heart sounds, no murmur noted Lungs: Bilat breath sounds diminished dependently, no wheezes Abdomen: Obese, soft, NT, + BS Ext: Muscle atrophy in the BLE, warm, diminished distal pulses, no edema Skin: Sacral decubitus ulcer -unstaged  LABS:  BMET  Recent Labs Lab 02/04/17 0457 02/05/17 0421 02/06/17 0437  NA 135 134* 137  K 4.8 4.4 4.1  CL 104 102 102  CO2 24 27 28   BUN 21* 23* 26*  CREATININE 1.95* 1.62* 1.26*  GLUCOSE 143* 126* 121*    Electrolytes  Recent Labs Lab 02/04/17 0457 02/05/17 0421 02/06/17 0437  CALCIUM 7.6* 7.7* 8.3*  MG 2.9* 2.2 1.8  PHOS 1.9* 2.3* 1.6*    CBC  Recent Labs Lab 02/04/17 0426 02/05/17 0421 02/06/17 0437  WBC 8.0 6.5 7.5  HGB 9.1* 9.0* 9.4*  HCT 29.2* 28.2* 28.5*  PLT 267 210 218    Coag's  Recent Labs Lab 02/02/17 0504  INR 1.15    Sepsis Markers No results for input(s): LATICACIDVEN, PROCALCITON, O2SATVEN in the last 168 hours.  ABG  Recent Labs Lab 02/03/17 0020 02/03/17 0330 02/06/17 0832  PHART 7.18* 7.33* 7.41  PCO2ART 80* 51* 52*  PO2ART 82* 59* 60*    Liver Enzymes  Recent Labs Lab 02/02/17 0504 02/04/17 0457  AST 20 22  ALT 14 14  ALKPHOS 102 108  BILITOT 0.6 0.6  ALBUMIN 2.0* 1.8*    Cardiac Enzymes No results for input(s): TROPONINI, PROBNP in the last 168 hours.  Glucose  Recent Labs Lab 02/04/17 1618 02/05/17 0027 02/05/17 0752 02/05/17 1616 02/06/17 0008 02/06/17 0805  GLUCAP 140* 130* 131* 126* 113* 120*   CXR: NSC cardiomegaly, moderate bilat pleural  effusions  ASSESSMENT / PLAN: 71 year old white female admitted to the ICU for acute and severe respiratory failure most likely from pneumonia with pleural effusion now with progressive respiratory failure   Patient successfully extubated from the ventilator and is in no acute restaurant distress at this time   PULMONARY Respiratory failure resolved-  CARDIOVASCULAR A:  Pericardial effusion -unclear etiology Hypotension P:  Cardiology following F/U echocardiogram performed 10/24 Continue phenylephrine to maintain MIP > 65 mmHg  RENAL Follow urine output  continue Foley catheter  GASTROINTESTINAL Speech therapy consult advance diet as tolerated    INFECTIOUS A:   Candiduria Sacral pressure wound Recent PNA -completed 8 days vanc/cefepime P:   Monitor temp, WBC count Micro and abx as above  ID service following  ENDOCRINE A:   Hypothyroidism P:   Continue levothyroxine  NEUROLOGIC A:   Profound deconditioning @ baseline Spinal stenosis with paraplegia Depression with psychotic features ICU/vent assoc discomfort -Needs aggressive PT if tolerable may need rehab if feasible   Okay to transfer to general medical floor and will sign off at that time   Lucie Leather, M.D.  [facility]  Medical Director [facility] Medical Director [facility] Cardio-Pulmonary Department

## 2017-02-07 NOTE — Progress Notes (Signed)
[employer] - [place] [facility]   [name]    MR#:  [id number]  DATE OF BIRTH:  1945-09-17  SUBJECTIVE:   Patient extubated  REVIEW OF SYSTEMS:    Review of Systems  Constitutional: Positive for malaise/fatigue. Negative for chills and fever.  HENT: Negative.  Negative for ear discharge, ear pain, hearing loss, nosebleeds and sore throat.   Eyes: Negative.  Negative for blurred vision and pain.  Respiratory: Negative.  Negative for cough, hemoptysis, shortness of breath and wheezing.   Cardiovascular: Positive for leg swelling (IMPROVED). Negative for chest pain and palpitations.  Gastrointestinal: Negative.  Negative for abdominal pain, blood in stool, diarrhea, nausea and vomiting.  Genitourinary: Negative.  Negative for dysuria.  Musculoskeletal: Negative.  Negative for back pain.  Skin: Negative.   Neurological: Positive for weakness. Negative for dizziness, tremors, speech change, focal weakness, seizures and headaches.  Endo/Heme/Allergies: Negative.  Does not bruise/bleed easily.  Psychiatric/Behavioral: Positive for depression. Negative for hallucinations and suicidal ideas.       DRUG ALLERGIES:   Allergies  Allergen Reactions  . Ambien [Zolpidem Tartrate] Other (See Comments)    Reaction: altered mental status  . Amoxicillin Other (See Comments)    Reaction: blisters in mouth Has patient had a PCN reaction causing immediate rash, facial/tongue/throat swelling, SOB or lightheadedness with hypotension: Yes Has patient had a PCN reaction causing severe rash involving mucus membranes or skin necrosis: No Has patient had a PCN reaction that required hospitalization No Has patient had a PCN reaction occurring within the last 10 years: Yes If all of the above answers are "NO", then may proceed with Cephalosporin use.     VITALS:  Blood pressure 130/63, pulse 82, temperature (!) 97.5 F (36.4 C), temperature source Axillary,  resp. rate 18, height 5\' 10"  (1.778 m), weight 89.4 kg (197 lb 1.5 oz), SpO2 93 %.  PHYSICAL EXAMINATION:  Constitutional: Appears chronically ill appearing. No distressFollows simple commands HENT: Normocephalic. . Eyes: Conjunctivae and EOM are normal. PERRLA, no scleral icterus.  Neck: Normal ROM. Neck supple. No JVD. No tracheal deviation. CVS: RRR, S1/S2 +, no murmurs, no gallops, no carotid bruit.  Pulmonary: Effort and breath sounds normal, no stridor, rhonchi, wheezes, rales.  Abdominal: Soft. BS +,  no distension, tenderness, rebound or guarding.  Musculoskeletal: Normal range of motion. 1+ LEE  no tenderness.  Neuro: Alert. CN 2-12 grossly intact. No focal deficits.globally weak Skin: Skin is warm and dry. No rash noted. Psychiatric: flat affect    LABORATORY PANEL:   CBC  Recent Labs Lab 02/06/17 0437  WBC 7.5  HGB 9.4*  HCT 28.5*  PLT 218   ------------------------------------------------------------------------------------------------------------------  Chemistries   Recent Labs Lab 02/04/17 0457  02/06/17 0437  NA 135  < > 137  K 4.8  < > 4.1  CL 104  < > 102  CO2 24  < > 28  GLUCOSE 143*  < > 121*  BUN 21*  < > 26*  CREATININE 1.95*  < > 1.26*  CALCIUM 7.6*  < > 8.3*  MG 2.9*  < > 1.8  AST 22  --   --   ALT 14  --   --   ALKPHOS 108  --   --   BILITOT 0.6  --   --   < > = values in this interval not displayed. ------------------------------------------------------------------------------------------------------------------  Cardiac Enzymes No results for input(s): TROPONINI in the last 168 hours. ------------------------------------------------------------------------------------------------------------------  RADIOLOGY:  Dg Abd 1 View  Result Date: 02/05/2017 CLINICAL DATA:  NG tube placement EXAM: ABDOMEN - 1 VIEW COMPARISON:  02/03/2017 FINDINGS: There are 2 esophageal tubes present, nasogastric tube tip overlies the distal stomach.  Feeding tube tip overlies the gastroduodenal junction. Bilateral effusions and bibasilar opacity. Cardiomegaly. IMPRESSION: 1. Newly inserted esophageal tube tip overlies the gastroduodenal junction 2. The previously placed esophageal tube tip overlies the distal stomach 3. Cardiomegaly with barrel effusions and bibasilar consolidation Electronically Signed   By: Jasmine Pang M.D.   On: 02/05/2017 17:28     ASSESSMENT AND PLAN:   71 year old female with past medical history of morbid obesity, hypertension, hyperlipidemia, recurrent urinary tract infections, spinal stenosis, chronic pain who presented to the hospital due to altered mental status initially.  1. Acute respiratory failure with hypoxia and hypercapnia-secondary to volume overload from pulmonary edema. Patient developed pulmonary edema secondary to acute kidney injury from side effects from amphotericin B. Status post extubation on October 27 and doing well Continue diuresis with IV Lasix.    2. Hypotension: This has improved and she is now off vasopressors.  3. AMS/Encephalopathy: Thought to be metabolic encephalopathy in the setting of pneumonia and depression. Seen by neurology and underwent a neurologic workup including CT head, MRI brain which was all negative. Continue to hold sedative meds for now follow mental status   4. History of HCAP With pleural effusions status post thoracentesis. Patient has completed IV antibiotic therapy with vancomycin and cefepime. No acute issue presently.  5. Recurrent urinary tract infection with funguria. Seen by infectious disease and started on amphotericin B but patient developed acute kidney injury secondary to it. As per infectious disease she has been adequately treated Follow up on repeat urine culture taken on 1026    6. Essential hypertension: Blood pressure meds on hold due to relative hypotension presently.  7. Pericardial Effusion: This was incidentally noted on  admission. Echocardiogram showed moderate pericardial effusion but she has no evidence of pericardial tamponade. Seen by cardiology and no plans for present intervention.  8. Nephrolithiasis with candiduria-seen by urology and patient has small nonobstructive right sided stones. She was planned  for elective ureteroscopy but due to patient's respiratory status it was canceled. She will need outpatient urologic follow-up upon discharge.  9. Hypothyroidism-continue Synthroid.  10. Sacral decubitus ulcer-continue local wound care.  11. 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    Management plans discussed with intensivist CODE STATUS: FULL  TOTAL TIME TAKING CARE OF THIS PATIENT: 12 minutes.     POSSIBLE D/C ??, DEPENDING ON CLINICAL CONDITION.   Armour Villanueva M.D on 02/07/2017 at 8:49 AM  Between 7am to 6pm - Pager - [phone] After 6pm go to [email or web address] - Social research officer, government  [facility]  Office  [phone]  CC: Primary care physician; [facility]  Note: This dictation was prepared with Nurse, children's dictation along with smaller phrase technology. Any transcriptional errors that result from this process are unintentional.

## 2017-02-07 NOTE — Progress Notes (Signed)
PT Cancellation Note  Patient Details Name: Jessica [name] MRN: [id number] DOB: Mar 22, 1946   Cancelled Treatment:    Reason Eval/Treat Not Completed:  Chart reviewed, as PT re-consulted status post extubation and return to floor (from CCU).  Per chart review, patient previously screened by PT during this same hospitalization and identified with no acute PT needs.  History significant for bed-bound baseline status, dependent care and use of manual (hoyer) lift for functional transfers as needed x12 years (due to spinal stenosis and associated nerve damage/paraplegia); husband and son providing care.  Despite acute medical issues, functional status likely unchanged (as patient will remain dependent), thus, question need for intensive, skilled intervention at this time.  Will hold formal PT evaluation until full care team available to discuss goals of care, discharge disposition to determine appropriateness/need for full evaluation.  Will continue efforts next date.    Breta Demedeiros H. Manson PasseyBrown, PT, DPT, NCS 02/07/17, 2:59 PM [phone]

## 2017-02-07 NOTE — Plan of Care (Signed)
Problem: Nutrition: Goal: Adequate nutrition will be maintained Outcome: Not Progressing Pt NPO d/t pt failing bedside swallow study conducted in ICU, SLP evaluation order in [facility]; Speech Therapist to see pt 02/08/17

## 2017-02-07 NOTE — Plan of Care (Signed)
Problem: Safety: Goal: Ability to remain free from injury will improve Outcome: Progressing Pt changed to Low Fall Precautions d/t h/o bil quadraplegia; pt turned q2hr

## 2017-02-08 LAB — CULTURE, BLOOD (ROUTINE X 2)
Culture: NO GROWTH
Culture: NO GROWTH

## 2017-02-08 LAB — PHOSPHORUS: Phosphorus: 3.4 mg/dL (ref 2.5–4.6)

## 2017-02-08 LAB — MAGNESIUM: Magnesium: 1.8 mg/dL (ref 1.7–2.4)

## 2017-02-08 MED ORDER — ACETAMINOPHEN 325 MG PO TABS: 650.0000 mg | ORAL_TABLET | Freq: Four times a day (QID) | ORAL | Status: DC | PRN

## 2017-02-08 MED ORDER — MAGNESIUM SULFATE 2 GM/50ML IV SOLN
2.0000 g | Freq: Once | INTRAVENOUS | Status: AC
  Administered 2017-02-08: 2 g via INTRAVENOUS
  Filled 2017-02-08: qty 50

## 2017-02-08 MED ORDER — LEVOTHYROXINE SODIUM 50 MCG PO TABS
150.0000 ug | ORAL_TABLET | Freq: Every day | ORAL | Status: DC
  Administered 2017-02-08 – 2017-02-11 (×4): 150 ug via ORAL
  Filled 2017-02-08 (×4): qty 3

## 2017-02-08 NOTE — Progress Notes (Signed)
Nutrition Follow-up  DOCUMENTATION CODES:   Non-severe (moderate) malnutrition in context of chronic illness  INTERVENTION:  Provide Magic cup TID with meals, each supplement provides 290 kcal and 9 grams of protein.  Provide Mighty Shake II/Vital Cuisine TID with meals. Each Mighty Shake II provides 480-500 kcals and 20-23 grams of protein. Each Vital Cuisine provides 520 kcal, 22 grams of protein. Both are nectar-thick.  Encouraged adequate intake of calories and protein from meals and supplements.  NUTRITION DIAGNOSIS:   Malnutrition (Moderate) related to chronic illness (inadequate inake, pressure ulcers) as evidenced by mild depletion of body fat, mild depletion of muscle mass.  Ongoing.  GOAL:   Patient will meet greater than or equal to 90% of their needs  Not met. Addressing with Magic Cup and [employer] II/Vital Cuisine.  MONITOR:   PO intake, Supplement acceptance, Labs, Weight trends, Skin, I & O's  REASON FOR ASSESSMENT:   Low Braden    ASSESSMENT:   Ms. Dalia has a PMH of HTN, HLD, morbid obesity, spinal stenosis, recurrent UTIs,  Presents with AMS, PNA, pleural effusion, pericardial effusion   -Patient was extubated on 10/27. TFs were stopped and OGT was removed. Per last RD note it appeared plan was for placement of NGT to re-start tube feeds, but this did not appear to happen. -Following SLP assessment today patient was put on dysphagia 1 diet with nectar-thick liquids.  Spoke with patient, husband, and son at bedside. Patient resting quietly in bed, but able to answer some questions. Son is upset about care received prior to transferring to ICU. Patient had just finished her first meal post-extubation. Husband reports she had bites of everything on the tray, and finished about 50% of the chocolate pudding. Husband feels that she will be able to start eating better. Patient had some abdominal pain earlier this morning, but reports it is improved  now.  After RD had already left room, son came out demanding coke for patient as she was thirsty. Informed son that patient is on nectar-thick liquids and that the coke on the floor is not nectar-thick. Offered to call kitchen to request nectar-thick beverage or bring from available stock on floor. Son became irritated demanding "She is thirsty now I do not want to wait." Brought nectar-thick iced tea from nourishment room.  Medications reviewed and include: famotidine, levothyroxine, senna.  Labs reviewed: Phosphorus and Magnesium WNL today. No other chem profile in the past 48 hours.  I/O: 675 mL UOP yesterday (0.3 mL/kg/hr); 3 BM past 24 hrs (all medium-sized type 4)  Weight trend: RD obtained bed scale weight of 198.9 lbs today (90.4 kg); fairly weight-stable  Discussed with RN.   Diet Order:  DIET - DYS 1 Room service appropriate? Yes with Assist; Fluid consistency: Nectar Thick  EDUCATION NEEDS:   No education needs have been identified at this time  Skin:  Skin Assessment: Skin Integrity Issues: Skin Integrity Issues:: Stage II, Stage I Stage I: left ear Stage II: ischial tuberosity, right ear  Last BM:  02/08/2017 - medium type 4  Height:   Ht Readings from Last 1 Encounters:  01/15/17 '5\' 10"'$  (1.778 m)    Weight:   Wt Readings from Last 1 Encounters:  02/08/17 198 lb 14.4 oz (90.2 kg)    Ideal Body Weight:  68.18 kg  BMI:  Body mass index is 28.54 kg/m.  Estimated Nutritional Needs:   Kcal:  1945-2095 (MSJ x 1.3-1.4)  Protein:  106-124 grams (1.2-1.4 grams/kg)  Fluid:  1.7-2 L/day (25-30 ml/kg IBW)  Willey Blade, MS, RD, LDN Office: [phone] Pager: [phone] After Hours/Weekend Pager: [phone]

## 2017-02-08 NOTE — Evaluation (Signed)
Occupational Therapy Evaluation Patient Details Name: Jessica [name] MRN: [id number] DOB: [birth year] Today's Date: 02/08/2017    History of Present Illness 71 year old female with a history of depression and chronically bed bound due to spinal stenosis who presented October 5 due to altered mental status. Later diagnosed to have pneumonia with pleural effusion and UTI. Transferred to ICU on 10/23, intubated 10/24-10/27, transferred back up to floor.    Clinical Impression   Pt seen for OT evaluation this date. Initial OT evaluation completed 01/25/17, with change in pt status with transfer to ICU and intubated on 10/23. Extubated 10/27 and admitted back to floor with new OT orders to evaluate and treat. Prior to hospital admission, pt was independent with self feeding and grooming tasks after set up from bed level, requiring her spouse and son to assist with tranfers using a Hoyer lift, max assist for bathing, toileting, and dressing tasks. Spouse and son also assist with cooking/cleaning and medication management. Son present for entire evaluation. 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 Pt sleeps on a low air loss hospital bed in the home. Per son, he sometimes has to assist with set up of meal (cut food, open small containers) due to decreased grip/pinch strength over time but generally pt was independent with self feeding. Pt this date required max assist to hold cup with nectar thick apple juice and bring to pt's mouth to take small sips through a straw. Son notes that he still has the built up handles for utensils previously given. Pt presents with impaired BUE strength (although improving from initial time of evaluation with improved cognition), activity tolerance, and increased need for caregiver assist for self feeding and grooming tasks. Pt/son educated in body and bed positioning to minimize aspiration risk and both verbalized  understanding. Pt/son educated briefly in benefits of BUE AROM exercises to support strength and activity tolerance and both verbalized understanding. Pt would benefit from skilled OT to address noted impairments and functional limitations in self feeding and grooming tasks (see below for any additional details) in order to maximize return to PLOF. Upon hospital discharge, recommend pt discharge to home with home health OT services to provide education/training to address decreased strength, activity tolerance, pt/caregiver training, and AE/DME needs should the spouse and son feel confident in continuing to provide same level of assist for mobility and ADL skills. Will plan to provide education/training in BUE theraband exercises next session.     Follow Up Recommendations  Home health OT    Equipment Recommendations  None recommended by OT    Recommendations for Other Services       Precautions / Restrictions Precautions Precautions: Fall Restrictions Weight Bearing Restrictions: Yes RLE Weight Bearing: Non weight bearing LLE Weight Bearing: Non weight bearing Other Position/Activity Restrictions: bedbound at baseline      Mobility Bed Mobility Overal bed mobility: Needs Assistance Bed Mobility: Rolling Rolling: Max assist;+2 for physical assistance         General bed mobility comments: Max x2 for all bed mobility - at baseline  Transfers                 General transfer comment: deferred, pt dep on hoyer lift at baseline, unsafe to attempt this date    Balance Overall balance assessment:  (bedbound at baseline)  ADL either performed or assessed with clinical judgement   ADL Overall ADL's : Needs assistance/impaired Eating/Feeding: Maximal assistance;Bed level Eating/Feeding Details (indicate cue type and reason): Max assist to hold cup and bring to mouth to take sips of nectar thick juice through straw  2:2 fatigue/decreased strength. Pt/son educated in positioning and strategies to keep nectar thick liquids cold (spoke with SLP). Brough a couple nectar thick liquids for pt to trial. Grooming: Maximal assistance;Bed level   Upper Body Bathing: Bed level;Maximal assistance   Lower Body Bathing: Bed level;Total assistance Lower Body Bathing Details (indicate cue type and reason): baseline Upper Body Dressing : Bed level;Maximal assistance   Lower Body Dressing: Bed level;Total assistance Lower Body Dressing Details (indicate cue type and reason): baseline Toilet Transfer: Total assistance Toilet Transfer Details (indicate cue type and reason): baseline                 Vision Baseline Vision/History: No visual deficits Patient Visual Report: No change from baseline Vision Assessment?: No apparent visual deficits     Perception     Praxis      Pertinent Vitals/Pain Pain Assessment: Faces Faces Pain Scale: Hurts even more Pain Location: head, abdomen Pain Intervention(s): Limited activity within patient's tolerance;Monitored during session     Hand Dominance Right   Extremity/Trunk Assessment Upper Extremity Assessment Upper Extremity Assessment: Generalized weakness (grossly 3/5 bilaterally for shoulder and elbow strength, grip strength 4-/5 bilaterally; ROM WFL)   Lower Extremity Assessment Lower Extremity Assessment: Generalized weakness (grossly 2-/5 bilaterally (likely near baseline))   Cervical / Trunk Assessment Cervical / Trunk Assessment: Normal   Communication Communication Communication: No difficulties (increased effort to speak briefly)   Cognition Arousal/Alertness: Awake/alert Behavior During Therapy: WFL for tasks assessed/performed Overall Cognitive Status: Within Functional Limits for tasks assessed                                 General Comments: improved cognition since last seen by this OT. Able to follow all commands, answer  questions appropriately.   General Comments       Exercises Other Exercises Other Exercises: Pt/son educated in simple BUE ROM exercises to perform at bed level to support improving strength and activity tolerance. Pt/son verbalized understanding. Will address theraband exercises next date.   Shoulder Instructions      Home Living Family/patient expects to be discharged to:: Private residence Living Arrangements: Spouse/significant other;Children (spouse and adult son) Available Help at Discharge: Family;Available 24 hours/day (spouse/son have been providing care for past 12 years) Type of Home: House Home Access: Level entry     Home Layout: One level               Home Equipment: Hospital bed;Other (comment) (hoyer lift, hospital air bed w/ bilat bed rails)          Prior Functioning/Environment Level of Independence: Needs assistance  Gait / Transfers Assistance Needed: dep for transfers and mobility ADL's / Homemaking Assistance Needed: spouse/son provide max assist for bathing, dressing, toileting, medication mgt, cooking/cleaning; pt able to self feed and groom with set up (son cuts food)            OT Problem List: Decreased strength;Decreased activity tolerance;Decreased knowledge of use of DME or AE;Impaired UE functional use      OT Treatment/Interventions: Self-care/ADL training;Therapeutic exercise;Therapeutic activities;Energy conservation;DME and/or AE instruction;Patient/family education    OT Goals(Current goals can be found in  the care plan section) Acute Rehab OT Goals Patient Stated Goal: return to PLOF OT Goal Formulation: With patient/family Time For Goal Achievement: 02/22/17 Potential to Achieve Goals: Good  OT Frequency: Min 1X/week   Barriers to D/C:            Co-evaluation              AM-PAC PT "6 Clicks" Daily Activity     Outcome Measure Help from another person eating meals?: A Lot Help from another person taking care  of personal grooming?: A Lot Help from another person toileting, which includes using toliet, bedpan, or urinal?: Total Help from another person bathing (including washing, rinsing, drying)?: Total Help from another person to put on and taking off regular upper body clothing?: Total Help from another person to put on and taking off regular lower body clothing?: Total 6 Click Score: 8   End of Session    Activity Tolerance: Patient tolerated treatment well Patient left: in bed;with call bell/phone within reach;with bed alarm set;with family/visitor present  OT Visit Diagnosis: Muscle weakness (generalized) (M62.81)                Time: 8413-2440 OT Time Calculation (min): 33 min Charges:  OT General Charges $OT Visit: 1 Visit OT Evaluation $OT Eval Moderate Complexity: 1 Mod OT Treatments $Self Care/Home Management : 8-22 mins G-Codes: OT G-codes **NOT FOR INPATIENT CLASS** Functional Assessment Tool Used: AM-PAC 6 Clicks Daily Activity;Clinical judgement Functional Limitation: Self care Self Care Current Status (N0272): At least 80 percent but less than 100 percent impaired, limited or restricted Self Care Goal Status (Z3664): At least 60 percent but less than 80 percent impaired, limited or restricted   [name], MPH, MS, OTR/L ascom [phone] 02/08/17, 3:58 PM

## 2017-02-08 NOTE — Progress Notes (Signed)
[facility] [place] [facility]   [name]    MR#:  [id number]  DATE OF BIRTH:  1945/12/08  SUBJECTIVE:   Patient has no complaint except weakness. Better oral intake. On O2  1 L. REVIEW OF SYSTEMS:    Review of Systems  Constitutional: Positive for malaise/fatigue. Negative for chills and fever.  HENT: Negative.  Negative for ear discharge, ear pain, hearing loss, nosebleeds and sore throat.   Eyes: Negative.  Negative for blurred vision and pain.  Respiratory: Negative.  Negative for cough, hemoptysis, shortness of breath and wheezing.   Cardiovascular: Negative for chest pain, palpitations and leg swelling (IMPROVED).  Gastrointestinal: Negative.  Negative for abdominal pain, blood in stool, diarrhea, nausea and vomiting.  Genitourinary: Negative.  Negative for dysuria.  Musculoskeletal: Negative.  Negative for back pain.  Skin: Negative.   Neurological: Positive for weakness. Negative for dizziness, tremors, speech change, focal weakness, seizures and headaches.  Endo/Heme/Allergies: Negative.  Does not bruise/bleed easily.  Psychiatric/Behavioral: Negative for depression, hallucinations and suicidal ideas.       DRUG ALLERGIES:   Allergies  Allergen Reactions  . Ambien [Zolpidem Tartrate] Other (See Comments)    Reaction: altered mental status  . Amoxicillin Other (See Comments)    Reaction: blisters in mouth Has patient had a PCN reaction causing immediate rash, facial/tongue/throat swelling, SOB or lightheadedness with hypotension: Yes Has patient had a PCN reaction causing severe rash involving mucus membranes or skin necrosis: No Has patient had a PCN reaction that required hospitalization No Has patient had a PCN reaction occurring within the last 10 years: Yes If all of the above answers are "NO", then may proceed with Cephalosporin use.     VITALS:  Blood pressure (!) 128/51, pulse 72, temperature 97.6 F (36.4 C),  temperature source Oral, resp. rate 18, height 5\' 10"  (1.778 m), weight 197 lb 1.5 oz (89.4 kg), SpO2 94 %.  PHYSICAL EXAMINATION:  Constitutional: obesity. No distress. Follows simple commands HENT: Normocephalic. . Eyes: Conjunctivae and EOM are normal. PERRLA, no scleral icterus.  Neck: Normal ROM. Neck supple. No JVD. No tracheal deviation. CVS: RRR, S1/S2 +, no murmurs, no gallops, no carotid bruit.  Pulmonary: Effort and breath sounds normal, no stridor, rhonchi, wheezes, rales.  Abdominal: Soft. BS +,  no distension, tenderness, rebound or guarding.  Musculoskeletal: Normal range of motion. Trace leg edema, no tenderness.  Neuro: Alert. CN 2-12 grossly intact. No focal deficits.globally weak Skin: Skin is warm and dry. No rash noted. Psychiatric: flat affect LABORATORY PANEL:   CBC  Recent Labs Lab 02/06/17 0437  WBC 7.5  HGB 9.4*  HCT 28.5*  PLT 218   ------------------------------------------------------------------------------------------------------------------  Chemistries   Recent Labs Lab 02/04/17 0457  02/06/17 0437 02/08/17 0631  NA 135  < > 137  --   K 4.8  < > 4.1  --   CL 104  < > 102  --   CO2 24  < > 28  --   GLUCOSE 143*  < > 121*  --   BUN 21*  < > 26*  --   CREATININE 1.95*  < > 1.26*  --   CALCIUM 7.6*  < > 8.3*  --   MG 2.9*  < > 1.8 1.8  AST 22  --   --   --   ALT 14  --   --   --   ALKPHOS 108  --   --   --  BILITOT 0.6  --   --   --   < > = values in this interval not displayed. ------------------------------------------------------------------------------------------------------------------  Cardiac Enzymes No results for input(s): TROPONINI in the last 168 hours. ------------------------------------------------------------------------------------------------------------------  RADIOLOGY:  No results found.   ASSESSMENT AND PLAN:   71 year old female with past medical history of morbid obesity, hypertension, hyperlipidemia,  recurrent urinary tract infections, spinal stenosis, chronic pain who presented to the hospital due to altered mental status initially.  1. Acute respiratory failure with hypoxia and hypercapnia-secondary to volume overload from pulmonary edema. Patient developed pulmonary edema secondary to acute kidney injury from side effects from amphotericin B. Status post extubation on October 27 Try to wean off O2 [place]. Continue diuresis with IV Lasix.   2. Hypotension: This has improved, off vasopressors.  3. AMS/Encephalopathy: Thought to be metabolic encephalopathy in the setting of pneumonia and depression. Seen by neurology and underwent a neurologic workup including CT head, MRI brain which was all negative. Continue to hold sedative meds for now follow mental status  Looks like baseline mental status now.  4. History of HCAP With pleural effusions status post thoracentesis. Patient has completed IV antibiotic therapy with vancomycin and cefepime. No acute issue presently.  5. Recurrent urinary tract infection with funguria. Seen by infectious disease and started on amphotericin B but patient developed acute kidney injury secondary to it. As per infectious disease she has been adequately treated Follow up on repeat urine culture taken on 1026: NO GROWTH.    6. Essential hypertension: Blood pressure meds on hold due to relative hypotension presently.  7. Pericardial Effusion: This was incidentally noted on admission. Echocardiogram showed moderate pericardial effusion but she has no evidence of pericardial tamponade. Seen by cardiology and no plans for present intervention.  8. Nephrolithiasis with candiduria-seen by urology and patient has small nonobstructive right sided stones. She was planned  for elective ureteroscopy but due to patient's respiratory status it was canceled. She will need outpatient urologic follow-up upon discharge.  9. Hypothyroidism-continue Synthroid.  10.  Sacral decubitus ulcer-continue local wound care.  11. 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  Management plans discussed with intensivist CODE STATUS: FULL  TOTAL TIME TAKING CARE OF THIS PATIENT: 32 minutes.   I discussed with her son and husband, they want the patient to be discharged to home once stable. POSSIBLE D/C home in 1-2 days, DEPENDING ON CLINICAL CONDITION.   Shaune Pollackhen, Sho Salguero M.D on 02/08/2017 at 12:04 PM  Between 7am to 6pm - Pager - [phone] After 6pm go to [email or web address] - Social research officer, governmentpassword EPAS [facility]  [facility]  Office  [phone]  CC: Primary care physician; Center, [facility]  Note: This dictation was prepared with Nurse, children'sDragon dictation along with smaller phrase technology. Any transcriptional errors that result from this process are unintentional.

## 2017-02-08 NOTE — Progress Notes (Addendum)
[facility] Date of Admission:  01/15/2017     ID: Jessica [name] is a 71 y.o. female with  Candiduria Principal Problem:   Severe recurrent major depression with psychotic features (HCC) Active Problems:   Pneumonia   Subacute delirium   Altered mental status   Right nephrolithiasis   Oligouria   Subjective: Out of unit and doing better. Eating some. No fevers.  ROS  Unable to obtain  Medications:  Antibiotics Given (last 72 hours)    None     . aspirin  81 mg Oral Daily  . collagenase   Topical Daily  . enoxaparin (LOVENOX) injection  40 mg Subcutaneous Q24H  . famotidine  20 mg Oral Daily  . levothyroxine  150 mcg Oral QAC breakfast  . mouth rinse  15 mL Mouth Rinse BID  . senna-docusate  2 tablet Oral Daily    Objective: Vital signs in last 24 hours: Temp:  [97.5 F (36.4 C)-97.6 F (36.4 C)] 97.6 F (36.4 C) (10/29 0420) Pulse Rate:  [72-82] 72 (10/29 0420) Resp:  [18] 18 (10/29 0420) BP: (127-128)/(51-56) 128/51 (10/29 0420) SpO2:  [90 %-94 %] 94 % (10/29 0420) Constitutional: intubated,  HENT: [place]/AT, PERRLA, no scleral icterus Mouth/Throat: Oropharynx is clear and dry. No oropharyngeal exudate.  Cardiovascular: Tachy Pulmonary/Chest: poor effort, bil rhonchi Neck = supple, no nuchal rigidity Abdominal: Soft. obese Bowel sounds are normal.  exhibits no distension. Foley in place -  Lymphadenopathy: no cervical adenopathy. No axillary adenopathy Neurological: intubated Skin: Skin is warm and dry. No rash noted. No erythema.  Psychiatric: intubated  Lab Results  Recent Labs  02/06/17 0437  WBC 7.5  HGB 9.4*  HCT 28.5*  NA 137  K 4.1  CL 102  CO2 28  BUN 26*  CREATININE 1.26*   Microbiology: Results for orders placed or performed during the hospital encounter of 01/15/17  Blood culture (routine x 2)     Status: None   Collection Time: 01/15/17  4:04 PM  Result Value Ref Range Status   Specimen  Description BLOOD BLOOD LEFT ARM  Final   Special Requests   Final    BOTTLES DRAWN AEROBIC AND ANAEROBIC Blood Culture adequate volume   Culture NO GROWTH 5 DAYS  Final   Report Status 01/20/2017 FINAL  Final  Blood culture (routine x 2)     Status: None   Collection Time: 01/15/17  4:05 PM  Result Value Ref Range Status   Specimen Description BLOOD LEFT ANTECUBITAL  Final   Special Requests   Final    BOTTLES DRAWN AEROBIC AND ANAEROBIC Blood Culture adequate volume   Culture NO GROWTH 5 DAYS  Final   Report Status 01/20/2017 FINAL  Final  Urine Culture     Status: Abnormal   Collection Time: 01/15/17  8:51 PM  Result Value Ref Range Status   Specimen Description URINE, CATHETERIZED  Final   Special Requests NONE  Final   Culture (A)  Final    >=100,000 COLONIES/mL CANDIDA PARAPSILOSIS SEE SEPARATE REPORT FOR SUSCEPTIBILITIES Performed at [facility], [address] [place], [place] [address]    Report Status 01/28/2017 FINAL  Final  Body fluid culture     Status: None   Collection Time: 01/18/17  9:10 AM  Result Value Ref Range Status   Specimen Description PLEURAL  Final   Special Requests NONE  Final   Gram Stain   Final    ABUNDANT WBC  PRESENT,BOTH PMN AND MONONUCLEAR NO ORGANISMS SEEN    Culture   Final    NO GROWTH 3 DAYS Performed at [facility], [address] [place], [place] [address]    Report Status 01/22/2017 FINAL  Final  MRSA PCR Screening     Status: None   Collection Time: 01/18/17  2:15 PM  Result Value Ref Range Status   MRSA by PCR NEGATIVE NEGATIVE Final    Comment:        The GeneXpert MRSA Assay (FDA approved for NASAL specimens only), is one component of a comprehensive MRSA colonization surveillance program. It is not intended to diagnose MRSA infection nor to guide or monitor treatment for MRSA infections.   Urine Culture     Status: Abnormal   Collection Time: 01/20/17 10:52 PM  Result Value Ref Range Status    Specimen Description URINE, RANDOM  Final   Special Requests Normal  Final   Culture 30,000 COLONIES/mL YEAST (A)  Final   Report Status 01/22/2017 FINAL  Final  Urine Culture     Status: Abnormal   Collection Time: 01/28/17  6:28 PM  Result Value Ref Range Status   Specimen Description URINE, RANDOM  Final   Special Requests NONE  Final   Culture 40,000 COLONIES/mL YEAST (A)  Final   Report Status 01/29/2017 FINAL  Final  MRSA PCR Screening     Status: None   Collection Time: 02/02/17 11:23 PM  Result Value Ref Range Status   MRSA by PCR NEGATIVE NEGATIVE Final    Comment:        The GeneXpert MRSA Assay (FDA approved for NASAL specimens only), is one component of a comprehensive MRSA colonization surveillance program. It is not intended to diagnose MRSA infection nor to guide or monitor treatment for MRSA infections.   Culture, blood (Routine X 2) w Reflex to ID Panel     Status: None   Collection Time: 02/03/17  5:38 AM  Result Value Ref Range Status   Specimen Description BLOOD RT ARM  Final   Special Requests   Final    BOTTLES DRAWN AEROBIC ONLY Blood Culture results may not be optimal due to an inadequate volume of blood received in culture bottles   Culture NO GROWTH 5 DAYS  Final   Report Status 02/08/2017 FINAL  Final  Culture, blood (Routine X 2) w Reflex to ID Panel     Status: None   Collection Time: 02/03/17  5:38 AM  Result Value Ref Range Status   Specimen Description BLOOD RT HAND  Final   Special Requests   Final    BOTTLES DRAWN AEROBIC AND ANAEROBIC Blood Culture results may not be optimal due to an inadequate volume of blood received in culture bottles   Culture NO GROWTH 5 DAYS  Final   Report Status 02/08/2017 FINAL  Final  Culture, respiratory (NON-Expectorated)     Status: None   Collection Time: 02/03/17  7:42 AM  Result Value Ref Range Status   Specimen Description TRACHEAL ASPIRATE  Final   Special Requests NONE  Final   Gram Stain   Final     FEW WBC PRESENT, PREDOMINANTLY PMN FEW GRAM POSITIVE COCCI    Culture   Final    FEW Consistent with normal respiratory flora. Performed at [facility], [address], [place], [place] [address]    Report Status 02/05/2017 FINAL  Final  Urine Culture     Status: None  Collection Time: 02/05/17  5:51 PM  Result Value Ref Range Status   Specimen Description URINE, CATHETERIZED  Final   Special Requests Normal  Final   Culture   Final    NO GROWTH Performed at [facility], [address] [place], [place] [address]    Report Status 02/07/2017 FINAL  Final    Studies/Results: No results found.  Assessment/Plan: Jessica [name] is a 71 y.o. female admitted with AMS and found to have pericardial and pleural effusion as well as PNA.  She also has had recurrent UTIs with yeast for about 6 months. Candiduria is unusual in patients without indwelling urinary caths however she has had multiple positive cultures with pyuria noted on UA.  CT did reveal stones and urology had foley placed for possible neurogenic bladder Cx reveals candida parapsilosis. Sensitivities reviewed and reveal resistant to fluconazole but sensitive to amphotericin. Intermediate to voriconazole. Echinocandins do not treat UTIs. She was started on amphotericin but has progressive ARF and decreased UOP. Transferred to unit for hypercarbic resp failure and intubated  10/29- out of unit, repeat ua and ucx negative.   Recommendations At this point seems to have cleared her infection however could recur since still some stones noted on renal USS Would hold on further abx therapy unless symptomatic and UA UCX show infection She has foley in place and defer management of that to Urology She may be too high risk for cystoscopy at this point so could consider monitoring and if recurs could consider procedure at that time.   I will sign off but please call with questions.Mick Sell   02/08/2017, 3:08 PM

## 2017-02-08 NOTE — Progress Notes (Signed)
PT Cancellation Note  Patient Details Name: Jessica [name] MRN: [id number] DOB: 08/15/1945   Cancelled Treatment:    Reason Eval/Treat Not Completed: PT screened, no needs identified, will sign off; Spoke to patient and patient's son [name] who confirmed that pt has been non-ambulatory for 12 years.  Pt is totally dependent with all functional mobility at baseline with pt's son and husband providing total assistance for bed mobility and using hoyer lift for bed to/from wheelchair transfers.  Per pt and pt's son pt is currently at baseline functionally with no need for skilled PT services.  [name] do however request OT services for assist with UE strength/ADL training for return to PLOF most notably with self-feeding.    Ovidio Hanger. Scott Orvin Netter PT, DPT 02/08/17, 10:37 AM

## 2017-02-08 NOTE — Evaluation (Signed)
Clinical/Bedside Swallow Evaluation Patient Details  Name: Jessica [name] MRN: [id number] Date of Birth: 26-Jun-1945  Today's Date: 02/08/2017 Time: SLP Start Time (ACUTE ONLY): 1000 SLP Stop Time (ACUTE ONLY): 1100 SLP Time Calculation (min) (ACUTE ONLY): 60 min  Past Medical History:  Past Medical History:  Diagnosis Date  . Chronic pain   . Depression   . HLD (hyperlipidemia)   . HTN (hypertension)   . Hypothyroidism   . Morbid obesity (HCC)   . Recurrent UTI   . Spinal stenosis   . Visual hallucinations    Past Surgical History:  Past Surgical History:  Procedure Laterality Date  . ABDOMINAL HYSTERECTOMY    . BACK SURGERY    . HEMORROIDECTOMY     HPI:  Pt is a 71 y.o. female with a known history of Hypertension, hyperlipidemia, morbid obesity, spinal stenosis, history of recurrent urinary tract infections who presents to the hospital brought in by her son due to altered mental status. Due to her mental status change she was brought to the ER for further evaluation. Emergency room patient underwent a chest x-ray which showed a possible left lower lobe pneumonia with pleural effusion. She subsequently underwent a CT of the chest which did show a left sided pleural effusion along with a large pericardial effusion. Hospitalist services were contacted further treatment and evaluation. Pt was intubatd d/t respiratory failure on 10/24 and extubated on 10/27.   Assessment / Plan / Recommendation Clinical Impression  Pt appears to present w/ Oropharyngeal Dysphagia w/ mild-moderate risk for aspiration. Pt's husband and son were present during the evaluation. Pt was recently intubated from 10/24 to 10/27. Pt's past medical hx includes Hypertension, hyperlipidemia, morbid obesity, spinal stenosis, and history of recurrent urinary tract infections.  Pt was given ice chips, puree, thin liquid, and Nectar consistency liquid during this evaluation. During trials of ice chips, Pt exhibited  bolus awareness and control w/ a timely swallow. No decline in vocal quality or respiratory status were noted given these trials. No overt, immediate s/s of aspiration were noted during these trials. During trials of thin liquid via cup, pt exhibited reduced labial seal when drinking from a cup, however, pt appeared to have a timely swallow. Pt exhibited 1x mild throat clear and 2x mod cough during trials of thin liquids via cup. Pt appeared to have a wet vocal quality after trials of thin liquid, however, pt's respiratory status did not appear to decline. During trials of Nectar consistency liquid via straw, pt's oral phase appeared grossly WFL. No overt, immediate s/s of aspiration were noted. No decline in vocal quality or respiratory status were noted given trials of Nectar consistency liquids via straw. During trials of puree, pt exhibited min prolonged oral transit, however, given time, pt exhibited swallow and adequate oral clearing. No decline in vocal quality or respiratory status were noted. No overt, immediate s/s of aspiration were noted during trials of puree. Pt required max assist during feeding d/t pt being easily fatigued. ST educated family on aspiration precautions - sitting upright, checking mouth for oral clearing, small sips/bites, and reducing distractions during meals.  D/t pt's overall presentation, Recommend Dysphagia level 1 w/ Nectar consistency liquids; strict aspiration precautions. Recommend max assist during meals d/t fatiguing. Recommend meds crushed in applesauce (if able). Recommend dietician consult for any supplemental nutrition needs.  ST services will f/u w/ pt status, diet toleration, and education while pt is admitted. NSG/MD updated.  SLP Visit Diagnosis: Dysphagia, oropharyngeal phase (R13.12)  Aspiration Risk  Mild aspiration risk (-Moderate)    Diet Recommendation   Dysphagia level 1 (puree) w/ nectar consistency liquids; aspiration precautions.   Medication  Administration: Crushed with puree (as able)    Other  Recommendations Recommended Consults:  (Dietician f/u) Oral Care Recommendations: Oral care BID;Staff/trained caregiver to provide oral care Other Recommendations: Order thickener from pharmacy   Follow up Recommendations  (TBD)      Frequency and Duration min 2x/week  2 weeks       Prognosis Prognosis for Safe Diet Advancement: Fair Barriers to Reach Goals: Severity of deficits      Swallow Study   General Date of Onset: 01/15/17 HPI: Pt is a 71 y.o. female with a known history of Hypertension, hyperlipidemia, morbid obesity, spinal stenosis, history of recurrent urinary tract infections who presents to the hospital brought in by her son due to altered mental status. Due to her mental status change she was brought to the ER for further evaluation. Emergency room patient underwent a chest x-ray which showed a possible left lower lobe pneumonia with pleural effusion. She subsequently underwent a CT of the chest which did show a left sided pleural effusion along with a large pericardial effusion. Hospitalist services were contacted further treatment and evaluation. Pt was intubatd d/t respiratory failure on 10/24 and extubated on 10/27. Type of Study: Bedside Swallow Evaluation Previous Swallow Assessment: none reported Diet Prior to this Study: NPO Temperature Spikes Noted: No (wbc WNL) Respiratory Status: Nasal cannula (1 L) History of Recent Intubation: Yes Length of Intubations (days): 3 days Date extubated: 02/06/17 Behavior/Cognition: Alert;Cooperative;Pleasant mood;Distractible;Requires cueing Oral Cavity Assessment: Within Functional Limits (for bolus management) Oral Care Completed by SLP: Recent completion by staff Oral Cavity - Dentition: Dentures, top;Dentures, bottom Vision: Functional for self-feeding Self-Feeding Abilities: Total assist (Pt fatigues easily) Patient Positioning: Upright in bed Baseline Vocal  Quality: Low vocal intensity Volitional Cough: Strong Volitional Swallow: Able to elicit    Oral/Motor/Sensory Function Overall Oral Motor/Sensory Function: Within functional limits   Ice Chips Ice chips: Within functional limits Presentation: Spoon (3 trials)   Thin Liquid Thin Liquid: Impaired Presentation: Cup (3 trials) Oral Phase Impairments: Reduced labial seal Pharyngeal  Phase Impairments: Throat Clearing - Immediate;Cough - Immediate (1x throat clearing, 2x cough)    Nectar Thick Nectar Thick Liquid: Within functional limits Presentation: Straw (3 trials)   Honey Thick Honey Thick Liquid: Not tested   Puree Puree: Impaired Presentation: Spoon (5 trials) Oral Phase Functional Implications: Prolonged oral transit   Solid   GO    Solid: Not tested       Nancy Fetterarson Barbee, SLP-Graduate Student Nancy Fetterarson Barbee 02/08/2017,11:34 AM   This information has been reviewed and agreed upon by this supervising clinician.  This patient note, response to treatment and overall treatment plan has been reviewed and this clinician agrees with the information provided.  02/09/17, 3:14 PM [phone] Jerilynn SomKatherine Watson, MS, CCC-SLP

## 2017-02-09 LAB — BASIC METABOLIC PANEL WITH GFR
Anion gap: 7 (ref 5–15)
BUN: 19 mg/dL (ref 6–20)
CO2: 32 mmol/L (ref 22–32)
Calcium: 8.6 mg/dL — ABNORMAL LOW (ref 8.9–10.3)
Chloride: 101 mmol/L (ref 101–111)
Creatinine, Ser: 0.71 mg/dL (ref 0.44–1.00)
GFR calc Af Amer: >60 mL/min
GFR calc non Af Amer: >60 mL/min
Glucose, Bld: 90 mg/dL (ref 65–99)
Potassium: 3.4 mmol/L — ABNORMAL LOW (ref 3.5–5.1)
Sodium: 140 mmol/L (ref 135–145)

## 2017-02-09 MED ORDER — PREGABALIN 75 MG PO CAPS
150.0000 mg | ORAL_CAPSULE | Freq: Three times a day (TID) | ORAL | Status: DC
  Administered 2017-02-09 – 2017-02-10 (×4): 150 mg via ORAL
  Filled 2017-02-09 (×5): qty 2

## 2017-02-09 MED ORDER — POTASSIUM CHLORIDE CRYS ER 20 MEQ PO TBCR
40.0000 meq | EXTENDED_RELEASE_TABLET | Freq: Once | ORAL | Status: AC
  Administered 2017-02-09: 08:00:00 40 meq via ORAL
  Filled 2017-02-09: qty 2

## 2017-02-09 MED ORDER — BACLOFEN 20 MG PO TABS
20.0000 mg | ORAL_TABLET | Freq: Every morning | ORAL | Status: DC
  Administered 2017-02-10 – 2017-02-11 (×2): 20 mg via ORAL
  Filled 2017-02-09 (×2): qty 1

## 2017-02-09 MED ORDER — BACLOFEN 10 MG PO TABS
10.0000 mg | ORAL_TABLET | Freq: Every day | ORAL | Status: DC
  Administered 2017-02-10: 10 mg via ORAL
  Filled 2017-02-09 (×3): qty 1

## 2017-02-09 MED ORDER — TRAZODONE HCL 50 MG PO TABS
100.0000 mg | ORAL_TABLET | Freq: Every day | ORAL | Status: DC
  Administered 2017-02-10: 100 mg via ORAL
  Filled 2017-02-09: qty 2

## 2017-02-09 MED ORDER — VENLAFAXINE HCL ER 75 MG PO CP24
225.0000 mg | ORAL_CAPSULE | Freq: Every day | ORAL | Status: DC
  Administered 2017-02-09 – 2017-02-10 (×2): 225 mg via ORAL
  Filled 2017-02-09 (×3): qty 3

## 2017-02-09 NOTE — Plan of Care (Signed)
Problem: Pain Managment: Goal: General experience of comfort will improve Outcome: Not Progressing Pt complains that she is constantly uncomfortable even after pain medication is given.  Problem: Nutrition: Goal: Adequate nutrition will be maintained Outcome: Progressing Pt ate 75% of her breakfast and 50% of her lunch today.  Problem: Bowel/Gastric: Goal: Will not experience complications related to bowel motility Outcome: Not Progressing Pt has not had BM since yesterday morning, states she feels constipated.

## 2017-02-09 NOTE — Progress Notes (Signed)
[employer] - [place] [facility]   [name]    MR#:  [id number]  DATE OF BIRTH:  1945-06-05  SUBJECTIVE:   Patient has no complaint except weakness. Better oral intake. On O2 [place] 1 L. She is hypoxia at 80s% without oxygen. Better oral intake for RN. But her son has concerns about poor oral intake. He doesn't want the patient to be discharged to home today. REVIEW OF SYSTEMS:    Review of Systems  Constitutional: Positive for malaise/fatigue. Negative for chills and fever.  HENT: Negative.  Negative for ear discharge, ear pain, hearing loss, nosebleeds and sore throat.   Eyes: Negative.  Negative for blurred vision and pain.  Respiratory: Negative.  Negative for cough, hemoptysis, shortness of breath and wheezing.   Cardiovascular: Negative for chest pain, palpitations and leg swelling (IMPROVED).  Gastrointestinal: Negative.  Negative for abdominal pain, blood in stool, diarrhea, nausea and vomiting.  Genitourinary: Negative.  Negative for dysuria.  Musculoskeletal: Negative.  Negative for back pain.  Skin: Negative.   Neurological: Positive for weakness. Negative for dizziness, tremors, speech change, focal weakness, seizures and headaches.  Endo/Heme/Allergies: Negative.  Does not bruise/bleed easily.  Psychiatric/Behavioral: Negative for depression, hallucinations and suicidal ideas.       DRUG ALLERGIES:   Allergies  Allergen Reactions  . Ambien [Zolpidem Tartrate] Other (See Comments)    Reaction: altered mental status  . Amoxicillin Other (See Comments)    Reaction: blisters in mouth Has patient had a PCN reaction causing immediate rash, facial/tongue/throat swelling, SOB or lightheadedness with hypotension: Yes Has patient had a PCN reaction causing severe rash involving mucus membranes or skin necrosis: No Has patient had a PCN reaction that required hospitalization No Has patient had a PCN reaction occurring within the last 10  years: Yes If all of the above answers are "NO", then may proceed with Cephalosporin use.     VITALS:  Blood pressure (!) 147/54, pulse 84, temperature (!) 97.4 F (36.3 C), temperature source Oral, resp. rate 20, height 5\' 10"  (1.778 m), weight 198 lb 14.4 oz (90.2 kg), SpO2 95 %.  PHYSICAL EXAMINATION:  Constitutional: obesity. No distress. Follows simple commands HENT: Normocephalic. . Eyes: Conjunctivae and EOM are normal. PERRLA, no scleral icterus.  Neck: Normal ROM. Neck supple. No JVD. No tracheal deviation. CVS: RRR, S1/S2 +, no murmurs, no gallops, no carotid bruit.  Pulmonary: Effort and breath sounds normal, no stridor, rhonchi, wheezes, rales.  Abdominal: Soft. BS +,  no distension, tenderness, rebound or guarding.  Musculoskeletal: Normal range of motion. No leg edema, no tenderness.  Neuro: Alert. CN 2-12 grossly intact. No focal deficits.globally weak Skin: Skin is warm and dry. No rash noted. Psychiatric: flat affect LABORATORY PANEL:   CBC  Recent Labs Lab 02/06/17 0437  WBC 7.5  HGB 9.4*  HCT 28.5*  PLT 218   ------------------------------------------------------------------------------------------------------------------  Chemistries   Recent Labs Lab 02/04/17 0457  02/08/17 0631 02/09/17 0459  NA 135  < >  --  140  K 4.8  < >  --  3.4*  CL 104  < >  --  101  CO2 24  < >  --  32  GLUCOSE 143*  < >  --  90  BUN 21*  < >  --  19  CREATININE 1.95*  < >  --  0.71  CALCIUM 7.6*  < >  --  8.6*  MG 2.9*  < > 1.8  --  AST 22  --   --   --   ALT 14  --   --   --   ALKPHOS 108  --   --   --   BILITOT 0.6  --   --   --   < > = values in this interval not displayed. ------------------------------------------------------------------------------------------------------------------  Cardiac Enzymes No results for input(s): TROPONINI in the last 168  hours. ------------------------------------------------------------------------------------------------------------------  RADIOLOGY:  No results found.   ASSESSMENT AND PLAN:   71 year old female with past medical history of morbid obesity, hypertension, hyperlipidemia, recurrent urinary tract infections, spinal stenosis, chronic pain who presented to the hospital due to altered mental status initially.  1. Acute respiratory failure with hypoxia and hypercapnia-secondary to volume overload from pulmonary edema. Patient developed pulmonary edema secondary to acute kidney injury from side effects from amphotericin B. Status post extubation on October 27 cannot wean off O2 [place]. Continue diuresis with Lasix.   2. Hypotension: This has improved, off vasopressors.  3. AMS/Encephalopathy: Thought to be metabolic encephalopathy in the setting of pneumonia and depression. Seen by neurology and underwent a neurologic workup including CT head, MRI brain which was all negative. Continue to hold sedative meds for now follow mental status  Looks like baseline mental status now. Resume the patient's home medication Including trazodone, venlafaxine, baclofen and Lyrica per her son's request.  4. History of HCAP With pleural effusions status post thoracentesis. Patient has completed IV antibiotic therapy with vancomycin and cefepime. No acute issue presently.  5. Recurrent urinary tract infection with funguria. Seen by infectious disease and started on amphotericin B but patient developed acute kidney injury secondary to it. As per infectious disease she has been adequately treated Follow up on repeat urine culture taken on 1026: NO GROWTH.    6. Essential hypertension: Blood pressure meds on hold due to relative hypotension presently.  7. Pericardial Effusion: This was incidentally noted on admission. Echocardiogram showed moderate pericardial effusion but she has no evidence of pericardial  tamponade. Seen by cardiology and no plans for present intervention.  8. Nephrolithiasis with candiduria-seen by urology and patient has small nonobstructive right sided stones. She was planned  for elective ureteroscopy but due to patient's respiratory status it was canceled. She will need outpatient urologic follow-up upon discharge.  9. Hypothyroidism-continue Synthroid.  10. Sacral decubitus ulcer-continue local wound care.  11. 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  Poor oral intake. Follow dietitian's recommendation.  CODE STATUS: FULL  TOTAL TIME TAKING CARE OF THIS PATIENT: 33 minutes.   I discussed with her son, he has concerns about poor oral intake.  POSSIBLE D/C home in 1-2 days, DEPENDING ON CLINICAL CONDITION.   Shaune Pollackhen, Worth Kober M.D on 02/09/2017 at 4:02 PM  Between 7am to 6pm - Pager - [phone] After 6pm go to [email or web address] - Social research officer, governmentpassword [email or web address]  [facility] [place] [facility]  Office  [phone]  CC: Primary care physician; [facility], [facility]  Note: This dictation was prepared with Nurse, children'sDragon dictation along with smaller phrase technology. Any transcriptional errors that result from this process are unintentional.

## 2017-02-10 ENCOUNTER — Inpatient Hospital Stay: Payer: Medicare Other

## 2017-02-10 MED ORDER — FUROSEMIDE 10 MG/ML IJ SOLN
40.0000 mg | Freq: Two times a day (BID) | INTRAMUSCULAR | Status: DC
  Administered 2017-02-10 – 2017-02-11 (×2): 40 mg via INTRAVENOUS
  Filled 2017-02-10 (×2): qty 4

## 2017-02-10 MED ORDER — POTASSIUM CHLORIDE CRYS ER 10 MEQ PO TBCR
10.0000 meq | EXTENDED_RELEASE_TABLET | Freq: Every day | ORAL | Status: AC
  Administered 2017-02-10 – 2017-02-11 (×2): 10 meq via ORAL
  Filled 2017-02-10 (×2): qty 1

## 2017-02-10 MED ORDER — FUROSEMIDE 40 MG PO TABS
40.0000 mg | ORAL_TABLET | Freq: Every day | ORAL | Status: DC
  Administered 2017-02-10: 09:00:00 40 mg via ORAL
  Filled 2017-02-10: qty 1

## 2017-02-10 NOTE — Progress Notes (Signed)
Speech Therapy Note: Chart reviewed; NSG consulted. Per NSG report, pt is tolerating Dysphagia level 1 diet w/ Nectar consistency liquid and meds crushed w/ puree. NSG reported lack of appetite but no immediate, overt s/s of aspiration during breakfast meal were observed.    Nancy Fetterarson Barbee, SLP-Graduate Student Nancy Fetterarson Barbee 02/10/17, 11:37 AM   This information has been reviewed and agreed upon by this supervising clinician.  This patient note, response to treatment and overall treatment plan has been reviewed and this clinician agrees with the information provided.  02/11/17, 9:42 AM [phone] Jerilynn SomKatherine Watson, MS, CCC-SLP

## 2017-02-10 NOTE — Plan of Care (Signed)
Problem: Pain Managment: Goal: General experience of comfort will improve Outcome: Progressing Pt slept entire shift.  Family did not want pt to be awakened so PM medications not given. Pt turned every 2 hours by staff.

## 2017-02-10 NOTE — Consult Note (Signed)
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  Parital thickness denuded perimeter surrounded by devitalized tissue in a linear shape in the center.  Consistent with the "shearing" origin described by her son while trying to transport to ED.  Spouse at bedside and states that he has cared for patient at home for 12 years using adult briefs.  He does not understand why we are not using briefs at this time.  Discussed the wicking property of Dermatherapy sheets and underpads.  The wicking feature and how it can improve the skin's microclimate.  Explained that the sacral wound is smaller and that we are continuing enzymatic debridement to the center linear abrasion.  He is thankful for this information.  Son at bedside as well today.  Right ear with resolving partial thickness lesion from oxygen tubing.  0.2 cm diameter.  Left ear has resolved.  Measurement: coccyx:  4 cm x 3 cm x 0.1 cm with linear center 4 cm x 0.5 cm 100% slough to wound bed.  Wound ZOX:[id number]:devitalized yellow slough Drainage (amount, consistency, odor) minimal serosanguinous   Periwound: intact Prevalon boots in place  Heels intact Dressing procedure/placement/frequency:Cleanse sacrum with NS.  Apply Santyl to devitalized tissue.  Barrier cream to periwound for protection and to promote moist wound healing.  Patient is to be turned and repositioned every two hours.  Oxygen in place.  Will not follow at this time.  Please re-consult if needed.  Maple HudsonKaren Abed Schar RN BSN CWON Pager [phone]

## 2017-02-10 NOTE — Progress Notes (Signed)
Occupational Therapy Treatment Patient Details Name: Jessica [name] MRN: [id number] DOB: Mar 13, 1946 Today's Date: 02/10/2017    History of present illness 71 year old female with a history of depression and chronically bed bound due to spinal stenosis who presented October 5 due to altered mental status. Later diagnosed to have pneumonia with pleural effusion and UTI. Transferred to ICU on 10/23, intubated 10/24-10/27, transferred back up to floor.    OT comments  Pt seen for OT treatment session focused on BUE exercise to improve strength and activity tolerance for self feeding and grooming tasks. Pt/spouse educated in BUE strengthening and ROM therapeutic exercises with theraband via verbal instruction and visual demonstration. Attempted to trial, but pt too fatigued, unable to maintain grasp of theraband, requiring max cues for participation and ultimately unable to fully participate. Spouse verbalized understanding of all exercises shown, verbalized plan to implement ex at home. Will continue to progress.   Follow Up Recommendations  Home health OT    Equipment Recommendations  None recommended by OT    Recommendations for Other Services      Precautions / Restrictions Precautions Precautions: Fall Restrictions Weight Bearing Restrictions: Yes RLE Weight Bearing: Non weight bearing LLE Weight Bearing: Non weight bearing Other Position/Activity Restrictions: bedbound at baseline       Mobility Bed Mobility                  Transfers                      Balance                                           ADL either performed or assessed with clinical judgement   ADL Overall ADL's : Needs assistance/impaired Eating/Feeding: Maximal assistance;Bed level                                     General ADL Comments: pt generally max assist for all ADL tasks     Vision       Perception     Praxis      Cognition  Arousal/Alertness: Lethargic Behavior During Therapy: WFL for tasks assessed/performed Overall Cognitive Status: Within Functional Limits for tasks assessed                                          Exercises Other Exercises Other Exercises: Pt/spouse educated in BUE strengthening and ROM therapeutic exercises with theraband via verbal instruction and visual demonstration. Attempted to trial, but pt too fatigued, unable to maintain grasp of theraband, requiring max cues for participation and ultimately unable to fully participate. Spouse verbalized understanding of all exercises shown, verbalized plan to implement ex at home.   Shoulder Instructions       General Comments      Pertinent Vitals/ Pain       Pain Assessment: No/denies pain  Home Living                                          Prior Functioning/Environment  Frequency  Min 1X/week        Progress Toward Goals  OT Goals(current goals can now be found in the care plan section)  Progress towards OT goals: Not progressing toward goals - comment (pt remains lethargic, unable to fully participate)  Acute Rehab OT Goals Patient Stated Goal: return to PLOF OT Goal Formulation: With patient/family Time For Goal Achievement: 02/22/17 Potential to Achieve Goals: Good  Plan Discharge plan remains appropriate;Frequency remains appropriate    Co-evaluation                 AM-PAC PT "6 Clicks" Daily Activity     Outcome Measure   Help from another person eating meals?: A Lot Help from another person taking care of personal grooming?: A Lot Help from another person toileting, which includes using toliet, bedpan, or urinal?: Total Help from another person bathing (including washing, rinsing, drying)?: Total Help from another person to put on and taking off regular upper body clothing?: Total Help from another person to put on and taking off regular lower body  clothing?: Total 6 Click Score: 8    End of Session    OT Visit Diagnosis: Muscle weakness (generalized) (M62.81)   Activity Tolerance Patient limited by lethargy   Patient Left in bed;with call bell/phone within reach;with bed alarm set;with family/visitor present   Nurse Communication Other (comment) (spouse requesting for staff to assist with turning pt in the bed)        Time: 1610-9604 OT Time Calculation (min): 23 min  Charges: OT General Charges $OT Visit: 1 Visit OT Treatments $Therapeutic Exercise: 23-37 mins  [name], MPH, MS, OTR/L ascom [phone] 02/10/17, 3:55 PM

## 2017-02-10 NOTE — Progress Notes (Signed)
[facility] [place] [facility] [place]   [name]    MR#:  [id number]  DATE OF BIRTH:  05/01/1945  SUBJECTIVE:   Patient with cough this am  REVIEW OF SYSTEMS:    Review of Systems  Constitutional: Negative for fever, chills weight loss + Weakness HENT: Negative for ear pain, nosebleeds, congestion, facial swelling, rhinorrhea, neck pain, neck stiffness and ear discharge.   Respiratory: Positive for cough, no shortness of breath, wheezing  Cardiovascular: Negative for chest pain, palpitations and leg swelling.  Gastrointestinal: Negative for heartburn, abdominal pain, vomiting, diarrhea or consitpation Genitourinary: Negative for dysuria, urgency, frequency, hematuria Musculoskeletal: Negative for back pain or joint pain Neurological: Negative for dizziness, seizures, syncope, focal weakness,  numbness and headaches.  Hematological: Does not bruise/bleed easily.  Psychiatric/Behavioral: Negative for hallucinations, confusion, dysphoric mood    Tolerating Diet:  yes      DRUG ALLERGIES:   Allergies  Allergen Reactions  . Ambien [Zolpidem Tartrate] Other (See Comments)    Reaction: altered mental status  . Amoxicillin Other (See Comments)    Reaction: blisters in mouth Has patient had a PCN reaction causing immediate rash, facial/tongue/throat swelling, SOB or lightheadedness with hypotension: Yes Has patient had a PCN reaction causing severe rash involving mucus membranes or skin necrosis: No Has patient had a PCN reaction that required hospitalization No Has patient had a PCN reaction occurring within the last 10 years: Yes If all of the above answers are "NO", then may proceed with Cephalosporin use.     VITALS:  Blood pressure (!) 119/46, pulse 80, temperature 98.1 F (36.7 C), temperature source Oral, resp. rate 17, height 5\' 10"  (1.778 m), weight 90.2 kg (198 lb 14.4 oz), SpO2 97 %.  PHYSICAL EXAMINATION:  Constitutional:  Appears well-developed and well-nourished. No distress. Generalized weakness HENT: Normocephalic. Marland Kitchen Oropharynx is clear and moist.  Eyes: Conjunctivae and EOM are normal. PERRLA, no scleral icterus.  Neck: Normal ROM. Neck supple. No JVD. No tracheal deviation. CVS: RRR, S1/S2 +, no murmurs, no gallops, no carotid bruit.  Pulmonary: Effort and breath sounds normal, no stridor, rhonchi, wheezes, rales.  Abdominal: Soft. BS +,  no distension, tenderness, rebound or guarding.  Musculoskeletal: Normal range of motion. 1+ lower extremity edema and no tenderness.  Neuro: Alert. CN 2-12 grossly intact. No focal deficits. Skin: Skin is warm and dry. No rash noted. Psychiatric: Normal mood and affect.      LABORATORY PANEL:   CBC  Recent Labs Lab 02/06/17 0437  WBC 7.5  HGB 9.4*  HCT 28.5*  PLT 218   ------------------------------------------------------------------------------------------------------------------  Chemistries   Recent Labs Lab 02/04/17 0457  02/08/17 0631 02/09/17 0459  NA 135  < >  --  140  K 4.8  < >  --  3.4*  CL 104  < >  --  101  CO2 24  < >  --  32  GLUCOSE 143*  < >  --  90  BUN 21*  < >  --  19  CREATININE 1.95*  < >  --  0.71  CALCIUM 7.6*  < >  --  8.6*  MG 2.9*  < > 1.8  --   AST 22  --   --   --   ALT 14  --   --   --   ALKPHOS 108  --   --   --   BILITOT 0.6  --   --   --   < > =  values in this interval not displayed. ------------------------------------------------------------------------------------------------------------------  Cardiac Enzymes No results for input(s): TROPONINI in the last 168 hours. ------------------------------------------------------------------------------------------------------------------  RADIOLOGY:  No results found.   ASSESSMENT AND PLAN:    71 year old female with past medical history of morbid obesity, hypertension, hyperlipidemia, recurrent urinary tract infections, spinal stenosis, chronic pain who  presented to the hospital due to altered mental status initially.  1. Acute respiratory failure with hypoxia and hypercapnia-secondary to volume overload from pulmonary edema. Patient developed pulmonary edema secondary to acute kidney injury from side effects from amphotericin B. Status post extubation on October 27 cannot wean off O2 [place].  Check chest x-ray today due to cough  Continue  Lasix.   2. Hypotension: This hasimproved, off vasopressors.  3. AMS/Encephalopathy: Thought to be metabolic encephalopathy in the setting of pneumonia and depression. Seen by neurology and underwent a neurologic workup including CT head, MRI brain which was all negative. She is at baseline  Resume the patient's home medication Including trazodone, venlafaxine, baclofen and Lyrica per her son's request.  4. History of HCAP With pleural effusions status post thoracentesis. Patient has completed IV antibiotic therapy with vancomycin and cefepime.   5. Recurrent urinary tract infection with funguria. Seen by infectious disease and started on amphotericin B but patient developed acute kidney injury secondary to it. As per infectious disease she has been adequately treated Follow up urine culture taken on 10/26 shows NO GROWTH.    6. Essential hypertension: Blood pressure meds on hold due to relative hypotension. Consider restarting prior to discharge as blood pressure has improved  7. Pericardial Effusion: This was incidentally noted on admission. Echocardiogram showed moderate pericardial effusion but she has no evidence of pericardial tamponade. Seen by cardiology and no plans for present intervention.  8. Nephrolithiasis with candiduria-seen by urology and patient has small nonobstructive right sided stones. She was planned  for elective ureteroscopy but due to patient's respiratory status it was canceled. She will need outpatient urologic follow-up upon discharge.  9. Hypothyroidism-continue  Synthroid.  10. Sacral decubitus ulcer-continue local wound care.  11. 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      Management plans discussed with the patient and son and he is in agreement.  CODE STATUS:  full  TOTAL TIME TAKING CARE OF THIS PATIENT: 22 minutes.     POSSIBLE D/C today or tomorrow, DEPENDING ON CLINICAL CONDITION.   Chava Dulac M.D on 02/10/2017 at 8:43 AM  Between 7am to 6pm - Pager - [phone] After 6pm go to [email or web address] - Social research officer, [email or web address] [email or web address]  [facility]  Office  [phone]  CC: Primary care physician; Center, [facility]  Note: This dictation was prepared with Nurse, children'sDragon dictation along with smaller phrase technology. Any transcriptional errors that result from this process are unintentional.

## 2017-02-11 ENCOUNTER — Encounter: Payer: Self-pay | Admitting: Internal Medicine

## 2017-02-11 LAB — BASIC METABOLIC PANEL
Anion gap: 5 (ref 5–15)
BUN: 20 mg/dL (ref 6–20)
CO2: 33 mmol/L — ABNORMAL HIGH (ref 22–32)
Calcium: 8.1 mg/dL — ABNORMAL LOW (ref 8.9–10.3)
Chloride: 101 mmol/L (ref 101–111)
Creatinine, Ser: 0.64 mg/dL (ref 0.44–1.00)
GFR calc Af Amer: >60 mL/min (ref >60)
GFR calc non Af Amer: >60 mL/min (ref >60)
Glucose, Bld: 80 mg/dL (ref 65–99)
Potassium: 4 mmol/L (ref 3.5–5.1)
Sodium: 139 mmol/L (ref 135–145)

## 2017-02-11 MED ORDER — COLLAGENASE 250 UNIT/GM EX OINT: TOPICAL_OINTMENT | Freq: Every day | CUTANEOUS | 0 refills | Status: DC

## 2017-02-11 MED ORDER — ZINC OXIDE 40 % EX OINT: 1.0000 "application " | TOPICAL_OINTMENT | CUTANEOUS | 0 refills | Status: DC | PRN

## 2017-02-11 MED ORDER — TRAZODONE HCL 100 MG PO TABS: 100.0000 mg | ORAL_TABLET | Freq: Every day | ORAL | 0 refills | Status: DC

## 2017-02-11 NOTE — Progress Notes (Signed)
Medications administered by student RN [phone] with supervision of Clinical Instructor Orvil FeilAbbie Babbie Dondlinger MSN, RN-BC or patient's assigned RN.

## 2017-02-11 NOTE — Discharge Summary (Addendum)
[facility] - [place] at [facility]   [name]    MR#:  [id number]  DATE OF BIRTH:  [birth year]  DATE OF ADMISSION:  01/15/2017 ADMITTING PHYSICIAN: Houston Siren, MD  DATE OF DISCHARGE: 02/11/2017  PRIMARY CARE PHYSICIAN: [facility]    ADMISSION DIAGNOSIS:  Pleural effusion [J90] HCAP (healthcare-associated pneumonia) [J18.9] Altered mental status, unspecified altered mental status type [R41.82]  DISCHARGE DIAGNOSIS:  Principal Problem:   Acute on chronic respiraotry failure HCAP Funguria AMS Acute on chronic diastolic heart failure SECONDARY DIAGNOSIS:   Past Medical History:  Diagnosis Date  . Chronic pain   . Depression   . HLD (hyperlipidemia)   . HTN (hypertension)   . Hypothyroidism   . Morbid obesity (HCC)   . Recurrent UTI   . Spinal stenosis   . Visual hallucinations    Diastolic heart failure HOSPITAL COURSE:   71 year old female with past medical history of morbid obesity, hypertension, hyperlipidemia, recurrent urinary tract infections, spinal stenosis, chronic pain who presented to the hospital due to altered mental status initially.  1. Acute respiratory failure with hypoxia and hypercapnia-secondary to volume overload from pulmonary edema. Patient developed pulmonary edema secondary to acute kidney injury from side effects from amphotericin B. Status post extubation on October 27 She will need O2 at discharge due to chronic pleural effusions.  She will continue PO  Lasix.   2. Hypotension: This hasimproved, off vasopressors.  3. AMS/Encephalopathy: Thought to be metabolic encephalopathy in the setting of pneumonia and depression. Seen by neurology and underwent a neurologic workup including CT head, MRI brain which was all negative. She is at baseline  She will continue home medication Including trazodone, venlafaxine, baclofen and Lyrica per her son's request.  4. History of  HCAP With pleural effusions status post thoracentesis. Patient has completed IV antibiotic therapy with vancomycin and cefepime.   5. Recurrent urinary tract infection with funguria. Seen by infectious disease and started on amphotericin B but patient developed acute kidney injury secondary to it. As per infectious disease she has been adequately treated Follow up urine culture taken on 10/26 shows NO GROWTH. She will need UROLOGY follow up and will be discharged with FOLEY due to urinary retention and bilateral hydronephrosis during this hospital stay. This was discussed with UROLOGY prior to discharge.  6. Essential hypertension: Blood pressure meds on hold due to relative hypotension.  7. Pericardial Effusion: This was incidentally noted on admission. Echocardiogram showed moderate pericardial effusion but she has no evidence of pericardial tamponade. Seen by cardiology and no plans for present intervention.  8. Nephrolithiasis with candiduria-seen by urology and patient has small nonobstructive right sided stones. She was planned for elective ureteroscopy but due to patient's respiratory status it was canceled. She will need outpatient urologic follow-up upon discharge.  9. Hypothyroidism-continue Synthroid.  10. Sacral decubitus ulcer-continue local wound care. Cleanse sacrum with NS.  Apply Santyl to devitalized tissue.  Barrier cream to periwound for protection and to promote moist wound healing.  Patient is to be turned and repositioned every two hours  11. 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   DISCHARGE CONDITIONS AND DIET:   Stable  Dysphagia 1 diet with nectar thick  CONSULTS OBTAINED:  Treatment Team:  Mertie Moores, MD Kym Groom, MD Bjorn Pippin, MD Aundria Rud, MD Pauletta Browns, MD  DRUG ALLERGIES:   Allergies  Allergen Reactions  . Ambien [Zolpidem Tartrate] Other (See Comments)    Reaction:  altered mental  status  . Amoxicillin Other (See Comments)    Reaction: blisters in mouth Has patient had a PCN reaction causing immediate rash, facial/tongue/throat swelling, SOB or lightheadedness with hypotension: Yes Has patient had a PCN reaction causing severe rash involving mucus membranes or skin necrosis: No Has patient had a PCN reaction that required hospitalization No Has patient had a PCN reaction occurring within the last 10 years: Yes If all of the above answers are "NO", then may proceed with Cephalosporin use.     DISCHARGE MEDICATIONS:   Current Discharge Medication List    START taking these medications   Details  collagenase (SANTYL) ointment Apply topically daily. Qty: 15 g, Refills: 0    liver oil-zinc oxide (DESITIN) 40 % ointment Apply 1 application topically as needed (affected area). Qty: 56.7 g, Refills: 0      CONTINUE these medications which have CHANGED   Details  traZODone (DESYREL) 100 MG tablet Take 1 tablet (100 mg total) by mouth at bedtime. Qty: 30 tablet, Refills: 0      CONTINUE these medications which have NOT CHANGED   Details  acetaminophen (TYLENOL) 325 MG tablet Take 650 mg by mouth every 6 (six) hours as needed for mild pain.    albuterol (PROVENTIL HFA;VENTOLIN HFA) 108 (90 Base) MCG/ACT inhaler Inhale 2 puffs into the lungs every 4 (four) hours as needed for wheezing or shortness of breath.    albuterol (PROVENTIL) (2.5 MG/3ML) 0.083% nebulizer solution Take 2.5 mg by nebulization every 6 (six) hours as needed for wheezing or shortness of breath.    aspirin EC 81 MG tablet Take 81 mg by mouth daily.    baclofen (LIORESAL) 20 MG tablet Take 5-20 mg by mouth 3 (three) times daily. 10 mg every morning and 20 mg every evening    docusate sodium (COLACE) 100 MG capsule Take 100 mg by mouth 2 (two) times daily.    furosemide (LASIX) 20 MG tablet Take 20 mg by mouth daily.    levothyroxine (SYNTHROID, LEVOTHROID) 150 MCG tablet Take 150 mcg by  mouth daily before breakfast.    polyethylene glycol (MIRALAX / GLYCOLAX) packet Take 17 g by mouth daily.    potassium chloride SA (K-DUR,KLOR-CON) 20 MEQ tablet Take 30 mEq by mouth daily.     pregabalin (LYRICA) 150 MG capsule Take 150 mg by mouth 3 (three) times daily.    Venlafaxine HCl 225 MG TB24 Take 225 mg by mouth daily.    vitamin B-12 (CYANOCOBALAMIN) 1000 MCG tablet Take 1,000 mcg by mouth daily.    Vitamin D, Ergocalciferol, (DRISDOL) 50000 units CAPS capsule Take 50,000 Units by mouth every 30 (thirty) days.      STOP taking these medications     amlodipine-atorvastatin (CADUET) 2.5-10 MG tablet      ARIPiprazole (ABILIFY) 2 MG tablet      fluconazole (DIFLUCAN) 100 MG tablet      fluticasone (FLONASE) 50 MCG/ACT nasal spray      mupirocin ointment (BACTROBAN) 2 %      nystatin cream (MYCOSTATIN)      senna-docusate (SENOKOT-S) 8.6-50 MG tablet      Simethicone 180 MG CAPS      triamcinolone ointment (KENALOG) 0.1 %      Zinc Oxide 13 % CREA      hydrocortisone (ANUSOL-HC) 2.5 % rectal cream      penciclovir (DENAVIR) 1 % cream           Today   CHIEF  COMPLAINT:  No issues overnight Patient ate her supper well. Cough has improved.   VITAL SIGNS:  Blood pressure (!) 115/42, pulse 68, temperature 97.7 F (36.5 C), temperature source Oral, resp. rate 17, height 5\' 10"  (1.778 m), weight 90.2 kg (198 lb 14.4 oz), SpO2 95 %.   REVIEW OF SYSTEMS:  Review of Systems  Constitutional: Negative for chills, fever and malaise/fatigue.  HENT: Negative.  Negative for ear discharge, ear pain, hearing loss, nosebleeds and sore throat.   Eyes: Negative.  Negative for blurred vision and pain.  Respiratory: Positive for cough (better). Negative for hemoptysis, shortness of breath and wheezing.   Cardiovascular: Negative.  Negative for chest pain, palpitations and leg swelling.  Gastrointestinal: Negative.  Negative for abdominal pain, blood in stool,  diarrhea, nausea and vomiting.  Genitourinary: Negative.  Negative for dysuria.  Musculoskeletal: Negative.  Negative for back pain.  Skin: Negative.   Neurological: Positive for weakness. Negative for dizziness, tremors, speech change, focal weakness, seizures and headaches.  Endo/Heme/Allergies: Negative.  Does not bruise/bleed easily.  Psychiatric/Behavioral: Negative.  Negative for depression, hallucinations and suicidal ideas.     PHYSICAL EXAMINATION:  GENERAL:  71 y.o.-year-old patient lying in the bed with no acute distress.  NECK:  Supple, no jugular venous distention. No thyroid enlargement, no tenderness.  LUNGS: Normal breath sounds bilaterally, no wheezing, rales,rhonchi  No use of accessory muscles of respiration.  CARDIOVASCULAR: S1, S2 normal. No murmurs, rubs, or gallops.  ABDOMEN: Soft, non-tender, non-distended. Bowel sounds present. No organomegaly or mass.  EXTREMITIES: 1+ LEE no cyanosis, or clubbing.  PSYCHIATRIC: The patient is alert and oriented x 3.  SKIN: coccyx:  4 cm x 3 cm x 0.1 cm with linear center 4 cm x 0.5 cm 100% slough to wound bed.  DATA REVIEW:   CBC  Recent Labs Lab 02/06/17 0437  WBC 7.5  HGB 9.4*  HCT 28.5*  PLT 218    Chemistries   Recent Labs Lab 02/08/17 0631  02/11/17 0446  NA  --   < > 139  K  --   < > 4.0  CL  --   < > 101  CO2  --   < > 33*  GLUCOSE  --   < > 80  BUN  --   < > 20  CREATININE  --   < > 0.64  CALCIUM  --   < > 8.1*  MG 1.8  --   --   < > = values in this interval not displayed.  Cardiac Enzymes No results for input(s): TROPONINI in the last 168 hours.  Microbiology Results  @MICRORSLT48 @  RADIOLOGY:  Dg Chest 1 View  Result Date: 02/10/2017 CLINICAL DATA:  71 year old female with cough. EXAM: CHEST 1 VIEW COMPARISON:  02/05/2017 and earlier. FINDINGS: Portable AP upright view at 0903 hours. Extubated. Enteric tube removed. Right IJ central line removed. Stable lung volumes. Bibasilar veiling  opacity persists. No pneumothorax. Stable vascularity, no overt edema. Stable cardiac size and mediastinal contours. Visualized tracheal air column is within normal limits. Paucity of bowel gas in the upper abdomen. IMPRESSION: 1. Extubated, lines and tubes removed. 2. Small to moderate bilateral pleural effusions with bibasilar collapse or consolidation. Electronically Signed   By: Odessa Fleming M.D.   On: 02/10/2017 09:14      Current Discharge Medication List    START taking these medications   Details  collagenase (SANTYL) ointment Apply topically daily. Qty: 15 g, Refills: 0  liver oil-zinc oxide (DESITIN) 40 % ointment Apply 1 application topically as needed (affected area). Qty: 56.7 g, Refills: 0      CONTINUE these medications which have CHANGED   Details  traZODone (DESYREL) 100 MG tablet Take 1 tablet (100 mg total) by mouth at bedtime. Qty: 30 tablet, Refills: 0      CONTINUE these medications which have NOT CHANGED   Details  acetaminophen (TYLENOL) 325 MG tablet Take 650 mg by mouth every 6 (six) hours as needed for mild pain.    albuterol (PROVENTIL HFA;VENTOLIN HFA) 108 (90 Base) MCG/ACT inhaler Inhale 2 puffs into the lungs every 4 (four) hours as needed for wheezing or shortness of breath.    albuterol (PROVENTIL) (2.5 MG/3ML) 0.083% nebulizer solution Take 2.5 mg by nebulization every 6 (six) hours as needed for wheezing or shortness of breath.    aspirin EC 81 MG tablet Take 81 mg by mouth daily.    baclofen (LIORESAL) 20 MG tablet Take 5-20 mg by mouth 3 (three) times daily. 10 mg every morning and 20 mg every evening    docusate sodium (COLACE) 100 MG capsule Take 100 mg by mouth 2 (two) times daily.    furosemide (LASIX) 20 MG tablet Take 20 mg by mouth daily.    levothyroxine (SYNTHROID, LEVOTHROID) 150 MCG tablet Take 150 mcg by mouth daily before breakfast.    polyethylene glycol (MIRALAX / GLYCOLAX) packet Take 17 g by mouth daily.    potassium  chloride SA (K-DUR,KLOR-CON) 20 MEQ tablet Take 30 mEq by mouth daily.     pregabalin (LYRICA) 150 MG capsule Take 150 mg by mouth 3 (three) times daily.    Venlafaxine HCl 225 MG TB24 Take 225 mg by mouth daily.    vitamin B-12 (CYANOCOBALAMIN) 1000 MCG tablet Take 1,000 mcg by mouth daily.    Vitamin D, Ergocalciferol, (DRISDOL) 50000 units CAPS capsule Take 50,000 Units by mouth every 30 (thirty) days.      STOP taking these medications     amlodipine-atorvastatin (CADUET) 2.5-10 MG tablet      ARIPiprazole (ABILIFY) 2 MG tablet      fluconazole (DIFLUCAN) 100 MG tablet      fluticasone (FLONASE) 50 MCG/ACT nasal spray      mupirocin ointment (BACTROBAN) 2 %      nystatin cream (MYCOSTATIN)      senna-docusate (SENOKOT-S) 8.6-50 MG tablet      Simethicone 180 MG CAPS      triamcinolone ointment (KENALOG) 0.1 %      Zinc Oxide 13 % CREA      hydrocortisone (ANUSOL-HC) 2.5 % rectal cream      penciclovir (DENAVIR) 1 % cream          A  Management plans discussed with the patient and son and he is in agreement. Stable for discharge   Patient should follow up with pcp  CODE STATUS:     Code Status Orders        Start     Ordered   01/15/17 2128  Full code  Continuous     01/15/17 2127    Code Status History    Date Active Date Inactive Code Status Order ID Comments User Context   01/01/2017  8:06 PM 01/03/2017  6:05 PM Full Code [id number]  Shaune Pollack, MD ED   12/19/2016  1:35 AM 12/20/2016  3:57 PM Full Code [id number]  Oralia Manis, MD Inpatient   09/12/2016  5:13 AM 09/13/2016  6:12  PM Full Code [id number]  Arnaldo Natal, MD Inpatient   08/14/2016  7:30 PM 08/15/2016  7:42 PM Full Code [id number]  Alford Highland, MD ED   07/06/2016  7:49 AM 07/07/2016  9:05 PM Full Code [id number]  Arnaldo Natal, MD Inpatient    Advance Directive Documentation     Most Recent Value  Type of Advance Directive  Healthcare Power of Attorney [Jessica [name]]  Pre-existing  out of facility DNR order (yellow form or pink MOST form)  -  "MOST" Form in Place?  -      TOTAL TIME TAKING CARE OF THIS PATIENT: 39 minutes.    Note: This dictation was prepared with Dragon dictation along with smaller phrase technology. Any transcriptional errors that result from this process are unintentional.  Railyn House M.D on 02/11/2017 at 7:55 AM  Between 7am to 6pm - Pager - [phone] After 6pm go to [email or web address] - Social research officer, government  [facility]  Office  [phone]  CC: Primary care physician; [facility]

## 2017-02-11 NOTE — Progress Notes (Signed)
SATURATION QUALIFICATIONS: (This note is used to comply with regulatory documentation for home oxygen)  Patient Saturations on Room Air at Rest =86%  Patient Saturations on Room Air while Ambulating = N/A  Patient Saturations on 2 Liters of oxygen while Ambulating = 94%  Please briefly explain why patient needs home oxygen: acute on chronic respiratory distress

## 2017-02-11 NOTE — Care Management (Signed)
Discharge to home today per Dr. Juliene PinaMody. Will be followed by [facility] for SN, PT,OT, Speech, and aide. [facility] will be providing home oxygen.  [facility] will be arranged for transportation. Gwenette GreetBrenda S Zorana Brockwell RN MSN CCM Care Management [phone]

## 2017-02-11 NOTE — Care Management (Signed)
Tried steroids and inhalers to increase respiratory stability. Placed back on oxygen to maintain stable saturation percentages. Gwenette GreetBrenda S Cable Fearn RN MSN CCM Care Management [phone]

## 2017-02-11 NOTE — Progress Notes (Signed)
Speech Language Pathology Treatment: Dysphagia  Patient Details Name: Jessica [name] MRN: [id number] DOB: 30-Aug-1945 Today's Date: 02/11/2017 Time: 1040-1130 SLP Time Calculation (min) (ACUTE ONLY): 50 min  Assessment / Plan / Recommendation Clinical Impression  Pt seen for ongoing toleration of dysphagia diet currently ordered; education w/ family. NSG reported good toleration of the puree foods and Nectar liquids when able to maintain alertness - pt has often been drowsy/lethargic possibly d/t medications per NSG/Son.  Due to pt's lethargy at time of session, po trials held and education/discussion was had w/ Son and husband present instead. The diet currently recommended is a Dysphagia level 1(puree) w/ Nectar liquids secondary to pt's lethargy and increased risk for aspiration d/t such. Handouts given and discussed w/ Son on diet and liquid consistency recommended; food preparation and food options; use of blender and food processor; options of po's that are naturally this recommended diet consistency; dysphagia and impact of lethargy on aspiration risk; aspiration precautions; and monitoring for s/s of aspiration and support strategies to lessen risk for aspiration. Discussed w/ Son the need to f/u w/ primary MD on timing of sedating medications to avoid interfering w/ the daytime hours of task of oral intake to lessen risk for aspiration and dehydration; also discussed need for f/u w/ Dietitian for nutritional support.  Son was able to recall and read back aspiration precautions on list given; other information discussed from the handouts on dysphagia, diet, and foods preparation ideas. Home Health ST services have been requested to f/u w/ pt and family on these needs and to assess the timing of the appropriateness of a diet upgrade to thin liquids and soft solids. All Son's questions answered. Pt preparing to d/c home; NSG in room.    HPI HPI: Pt is a 71 y.o. female with a known history of  Hypertension, hyperlipidemia, morbid obesity, spinal stenosis, history of recurrent urinary tract infections who presents to the hospital brought in by her son due to altered mental status. Due to her mental status change she was brought to the ER for further evaluation. Emergency room patient underwent a chest x-ray which showed a possible left lower lobe pneumonia with pleural effusion. She subsequently underwent a CT of the chest which did show a left sided pleural effusion along with a large pericardial effusion. Hospitalist services were contacted further treatment and evaluation. Pt was intubatd d/t respiratory failure on 10/24 and extubated on 10/27. She has been tolerating her current dysphagia diet as ordered but remains sleepy/drowsy frequently and requires moderate verbal/tactile cues by staff and family during meals to attend - Son and NSG reported pt is taking sedating medication and would like for this medication to be changed and given at Night time.       SLP Plan  Continue with current plan of care       Recommendations  Diet recommendations: Dysphagia 1 (puree);Nectar-thick liquid Liquids provided via: Cup;Straw (monitor any straw use) Medication Administration: Crushed with puree (as able) Supervision: Staff to assist with self feeding;Full supervision/cueing for compensatory strategies Compensations: Minimize environmental distractions;Slow rate;Small sips/bites;Lingual sweep for clearance of pocketing;Multiple dry swallows after each bite/sip;Follow solids with liquid Postural Changes and/or Swallow Maneuvers: Seated upright 90 degrees;Upright 30-60 min after meal                General recommendations:  (Dietician f/u) Oral Care Recommendations: Oral care BID;Staff/trained caregiver to provide oral care Follow up Recommendations: Home health SLP SLP Visit Diagnosis: Dysphagia, oropharyngeal phase (R13.12) Plan: Continue  with current plan of care       GO                 Jessica Som, MS, CCC-SLP Watson,Jessica [name] 02/11/2017, 3:31 PM

## 2017-02-18 ENCOUNTER — Ambulatory Visit: Payer: Self-pay

## 2017-02-22 ENCOUNTER — Telehealth: Payer: Self-pay

## 2017-02-22 NOTE — Telephone Encounter (Signed)
Jessica [name] with [facility] called stating pt will be due to have her foley changed Nov 15. Jessica [name] stated that pt is bed bound and inquired about getting orders for home health to change foley. Please advise.

## 2017-02-22 NOTE — Telephone Encounter (Signed)
That would be fine for home health to change her Foley.  She does need to come into the office, however, at some point which she appears to be scheduled for to discuss bladder management as well as stone management.  Vanna ScotlandAshley Lalonnie Shaffer, MD

## 2017-02-23 NOTE — Telephone Encounter (Signed)
Spoke with pt home health nurse, Maralyn SagoSarah, gave V.O. To have foley changed q30 days. Maralyn SagoSarah also stated that pt did not want to see Dr. Lonna CobbStoioff. Therefore an appt was made with Dr. Apolinar JunesBrandon to f/u. Sarah voiced understanding.

## 2017-03-03 ENCOUNTER — Ambulatory Visit: Payer: Self-pay | Admitting: Urology

## 2017-03-05 ENCOUNTER — Ambulatory Visit: Payer: Medicare Other

## 2017-03-10 ENCOUNTER — Ambulatory Visit: Payer: Self-pay | Admitting: Urology

## 2017-03-12 ENCOUNTER — Ambulatory Visit
Admission: RE | Admit: 2017-03-12 | Discharge: 2017-03-12 | Disposition: A | Discharge status: Home / Self Care | Payer: Medicare Other | Source: Ambulatory Visit | Attending: Urology | Admitting: Urology

## 2017-03-12 DIAGNOSIS — K859 Acute pancreatitis without necrosis or infection, unspecified: Secondary | ICD-10-CM | POA: Diagnosis not present

## 2017-03-12 DIAGNOSIS — J90 Pleural effusion, not elsewhere classified: Secondary | ICD-10-CM | POA: Insufficient documentation

## 2017-03-12 DIAGNOSIS — N2889 Other specified disorders of kidney and ureter: Secondary | ICD-10-CM | POA: Diagnosis not present

## 2017-03-12 DIAGNOSIS — J9811 Atelectasis: Secondary | ICD-10-CM | POA: Diagnosis not present

## 2017-03-12 MED ORDER — IOPAMIDOL (ISOVUE-300) INJECTION 61%
100.0000 mL | Freq: Once | INTRAVENOUS | Status: AC | PRN
  Administered 2017-03-12: 100 mL via INTRAVENOUS

## 2017-03-16 ENCOUNTER — Ambulatory Visit: Payer: Self-pay | Admitting: Urology

## 2017-03-30 ENCOUNTER — Encounter: Payer: Self-pay | Admitting: Urology

## 2017-03-30 ENCOUNTER — Ambulatory Visit (INDEPENDENT_AMBULATORY_CARE_PROVIDER_SITE_OTHER): Payer: Medicare Other | Admitting: Urology

## 2017-03-30 VITALS — BP 115/70 | HR 80

## 2017-03-30 DIAGNOSIS — Z8619 Personal history of other infectious and parasitic diseases: Secondary | ICD-10-CM

## 2017-03-30 DIAGNOSIS — R339 Retention of urine, unspecified: Secondary | ICD-10-CM | POA: Diagnosis not present

## 2017-03-30 DIAGNOSIS — N200 Calculus of kidney: Secondary | ICD-10-CM

## 2017-03-30 DIAGNOSIS — N2889 Other specified disorders of kidney and ureter: Secondary | ICD-10-CM | POA: Diagnosis not present

## 2017-03-30 NOTE — Patient Instructions (Signed)
Suprapubic Catheter Home Guide A suprapubic catheter is a rubber tube used to drain urine from the bladder into a collection bag. The catheter is inserted into the bladder through a small opening in the in the lower abdomen, near the center of the body, above the pubic bone (suprapubic area). There is a tiny balloon filled with germ-free (sterile) water on the end of the catheter that is in the bladder. The balloon helps to keep the catheter in place. Your suprapubic catheter may need to be replaced every 4-6 weeks, or as often as recommended by your health care provider. The collection bag must be emptied every day and cleaned every 2-3 days. The collection bag can be put beside your bed at night and attached to your leg during the day. You may have a large collection bag to use at night and a smaller one to use during the day. What are the risks?  Urine flow can become blocked. This can happen if the catheter is not working correctly, or if you have a blood clot in your bladder or in the catheter.  Tissue near the catheter may can become irritated and bleed.  Bacteria may get into your bladder and cause a urinary tract infection. How do I change the catheter? Supplies needed  Two pairs of sterile gloves.  Catheter.  Two syringes.  Sterile water.  Sterile cleaning solution.  Lubricant.  Collection bags. Changing the catheter To replace your catheter, take the following steps: 1. Drink plenty of fluids during the hours before you plan to change the catheter. 2. Wash your hands with soap and water. If soap and water are not available, use hand sanitizer. 3. Lie on your back and put on sterile gloves. 4. Clean the skin around the catheter opening using the sterile cleaning solution. 5. Remove the water from the balloon using a syringe. 6. Slowly remove the catheter. ? Do not pull on the catheter if it seems stuck. ? Call your health care provider immediately if you have difficulty  removing the catheter. 7. Take off the used gloves, and put on a new pair. 8. Put lubricant on the end of the new catheter that will go into your bladder. 9. Gently slide the catheter through the opening in your abdomen and into your bladder. 10. Wait for some urine to start flowing through the catheter. When urine starts to flow through the catheter, use a new syringe to fill the balloon with sterile water. 11. Attach the collection bag to the end of the catheter. Make sure the connection is tight. 12. Remove the gloves and wash your hands with soap and water.  How do I care for my skin around the catheter? Use a clean washcloth and soapy water to clean the skin around your catheter every day. Pat the area dry with a clean towel.  Do not pull on the catheter.  Do not use ointment or lotion on this area unless told by your health care provider.  Check your skin around the catheter every day for signs of infection. Check for: ? Redness, swelling, or pain. ? Fluid or blood. ? Warmth. ? Pus or a bad smell.  How do I clean and empty the collection bag? Clean the collection bag every 2-3 days, or as often as told by your health care provider. To do this, take the following steps:  Wash your hands with soap and water. If soap and water are not available, use hand sanitizer.  Disconnect the bag   from the catheter and immediately attach a new bag to the catheter.  Empty the used bag completely.  Clean the used bag using one of the following methods: ? Rinse the used bag with warm water and soap. ? Fill the bag with water and add 1 tsp of vinegar. Let it sit for about 30 minutes, then empty the bag.  Let the bag dry completely, and put it in a clean plastic bag before storing it. 1.   Empty the large collection bag every 8 hours. Empty the small collection bag when it is about ? full. To empty your large or small collection bag, take the following steps:  Always keep the bag below the  level of the catheter. This keeps urine from flowing backwards into the catheter.  Hold the bag over the toilet or another container. Turn the valve (spigot) at the bottom of the bag to empty the urine. ? Do not touch the opening of the spigot. ? Do not let the opening touch the toilet or container.  Close the spigot tightly when the bag is empty.  What are some general tips?  Always wash your hands before and after caring for your catheter and collection bag. Use a mild, fragrance-free soap. If soap and water are not available, use hand sanitizer.  Clean the catheter with soap and water as often as told by your health care provider.  Always make sure there are no twists or curls (kinks) in the catheter tube.  Always make sure there are no leaks in the catheter or collection bag.  Drink enough fluid to keep your urine clear or pale yellow.  Do not take baths, swim, or use a hot tub. When should I seek medical care? Seek medical care if:  You leak urine.  You have redness, swelling, or pain around your catheter opening.  You have fluid or blood coming from your catheter opening.  Your catheter opening feels warm to the touch.  You have pus or a bad smell coming from your catheter opening.  You have a fever or chills.  Your urine flow slows down.  Your urine becomes cloudy or smelly.  When should I seek immediate medical care? Seek immediate medical care if your catheter comes out, or if you have:  Nausea.  Back pain.  Difficulty changing your catheter.  Blood in your urine.  No urine flow for 1 hour.  This information is not intended to replace advice given to you by your health care provider. Make sure you discuss any questions you have with your health care provider. Document Released: 12/16/2010 Document Revised: 11/27/2015 Document Reviewed: 12/11/2014 Elsevier Interactive Patient Education  2018 [employer]  

## 2017-03-30 NOTE — Progress Notes (Signed)
03/30/2017 7:29 PM   Jessica [name] May 12, 1945 [id number]  Referring provider: [facility] [address] [place], [place] [address]  Chief Complaint  Patient presents with  . Nephrolithiasis    New Patient    HPI: 71 year old female with multiple GU issues seen and evaluated during her recent inpatient.  She had a prolonged admission during this admission, she was noted to have multiple issues including evidence of a neurogenic bladder with hydronephrosis requiring indwelling Foley catheter placement (no previous UDS).  She was ultimately dosed with treated with amphotericin resulted in acute renal failure secondary to toxicity.  We also considered treating small nonobstructing right-sided stones including layering stones up to 6 mm.  Unfortunately, she became more critically ill requiring intubation therefore elective surgery was reviewed.  Additionally, she was noted to have incidental left renal lesion.  She recently underwent dedicated renal imaging on 03/12/2017 demonstrating a solid enhancing left lower pole renal mass measuring 2.8 cm.   She returns to the office today to discuss all ongoing issues including management of her nonobstructing stones, bladder management, and follow-up of her renal mass.  She has been improving since her discharge.  She still bedbound.  She is accompanied today by both of her husband and son who are her caretakers.   PMH: Past Medical History:  Diagnosis Date  . Chronic pain   . Depression   . Diastolic heart failure (HCC)   . HLD (hyperlipidemia)   . HTN (hypertension)   . Hypothyroidism   . Morbid obesity (HCC)   . Recurrent UTI   . Spinal stenosis   . Visual hallucinations     Surgical History: Past Surgical History:  Procedure Laterality Date  . ABDOMINAL HYSTERECTOMY    . BACK SURGERY    . HEMORROIDECTOMY      Home Medications:  Allergies as of 03/30/2017      Reactions   Ambien  [zolpidem Tartrate] Other (See Comments)   Reaction: altered mental status   Amoxicillin Other (See Comments)   Reaction: blisters in mouth Has patient had a PCN reaction causing immediate rash, facial/tongue/throat swelling, SOB or lightheadedness with hypotension: Yes Has patient had a PCN reaction causing severe rash involving mucus membranes or skin necrosis: No Has patient had a PCN reaction that required hospitalization No Has patient had a PCN reaction occurring within the last 10 years: Yes If all of the above answers are "NO", then may proceed with Cephalosporin use.      Medication List        Accurate as of 03/30/17 11:59 PM. Always use your most recent med list.          acetaminophen 325 MG tablet Commonly known as:  TYLENOL Take 650 mg by mouth every 6 (six) hours as needed for mild pain.   albuterol 108 (90 Base) MCG/ACT inhaler Commonly known as:  PROVENTIL HFA;VENTOLIN HFA Inhale 2 puffs into the lungs every 4 (four) hours as needed for wheezing or shortness of breath.   albuterol (2.5 MG/3ML) 0.083% nebulizer solution Commonly known as:  PROVENTIL Take 2.5 mg by nebulization every 6 (six) hours as needed for wheezing or shortness of breath.   amlodipine-atorvastatin 2.5-10 MG tablet Commonly known as:  CADUET amlodipine 2.5 mg-atorvastatin 10 mg tablet  Take 1 tablet every day by oral route.   ARIPiprazole 2 MG tablet Commonly known as:  ABILIFY 1 BY MOUTH ONCE A DAY FOR MOOD AND HALLUCINATIONS   aspirin EC  81 MG tablet Take 81 mg by mouth daily.   baclofen 20 MG tablet Commonly known as:  LIORESAL Take 5-20 mg by mouth 3 (three) times daily. 10 mg every morning and 20 mg every evening   collagenase ointment Commonly known as:  SANTYL Apply topically daily.   fluconazole 150 MG tablet Commonly known as:  DIFLUCAN TAKE 1 TABLET BY MOUTH WEEKLY FOR PREVENTION OF UTI FROM YEAST-START AFTER 2 WEEKS OF DAILY FLUCONAZ   furosemide 20 MG  tablet Commonly known as:  LASIX Take 20 mg by mouth daily.   levothyroxine 150 MCG tablet Commonly known as:  SYNTHROID, LEVOTHROID Take 150 mcg by mouth daily before breakfast.   liver oil-zinc oxide 40 % ointment Commonly known as:  DESITIN Apply 1 application topically as needed (affected area).   polyethylene glycol packet Commonly known as:  MIRALAX / GLYCOLAX Take 17 g by mouth daily.   potassium chloride SA 20 MEQ tablet Commonly known as:  K-DUR,KLOR-CON Take 30 mEq by mouth daily.   pregabalin 150 MG capsule Commonly known as:  LYRICA Take 150 mg by mouth 3 (three) times daily.   Venlafaxine HCl 225 MG Tb24 Take 225 mg by mouth daily.   vitamin B-12 1000 MCG tablet Commonly known as:  CYANOCOBALAMIN Take 1,000 mcg by mouth daily.   Vitamin D (Ergocalciferol) 50000 units Caps capsule Commonly known as:  DRISDOL Take 50,000 Units by mouth every 30 (thirty) days.       Allergies:  Allergies  Allergen Reactions  . Ambien [Zolpidem Tartrate] Other (See Comments)    Reaction: altered mental status  . Amoxicillin Other (See Comments)    Reaction: blisters in mouth Has patient had a PCN reaction causing immediate rash, facial/tongue/throat swelling, SOB or lightheadedness with hypotension: Yes Has patient had a PCN reaction causing severe rash involving mucus membranes or skin necrosis: No Has patient had a PCN reaction that required hospitalization No Has patient had a PCN reaction occurring within the last 10 years: Yes If all of the above answers are "NO", then may proceed with Cephalosporin use.     Family History: Family History  Problem Relation Age of Onset  . Heart disease Father   . Deep vein thrombosis Father     Social History:  reports that  has never smoked. She has quit using smokeless tobacco. Her smokeless tobacco use included snuff. She reports that she does not drink alcohol or use drugs.  ROS: UROLOGY Frequent Urination?: No Hard  to postpone urination?: No Burning/pain with urination?: No Get up at night to urinate?: No Leakage of urine?: No Urine stream starts and stops?: No Trouble starting stream?: No Do you have to strain to urinate?: No Blood in urine?: No Urinary tract infection?: Yes Sexually transmitted disease?: No Injury to kidneys or bladder?: No Painful intercourse?: No Weak stream?: No Currently pregnant?: No Vaginal bleeding?: No Last menstrual period?: n  Gastrointestinal Nausea?: No Vomiting?: No Indigestion/heartburn?: Yes Diarrhea?: No Constipation?: Yes  Constitutional Fever: No Night sweats?: No Weight loss?: No Fatigue?: No  Skin Skin rash/lesions?: No Itching?: No  Eyes Blurred vision?: No Double vision?: No  Ears/Nose/Throat Sore throat?: No Sinus problems?: No  Hematologic/Lymphatic Swollen glands?: No Easy bruising?: No  Cardiovascular Leg swelling?: Yes Chest pain?: No  Respiratory Cough?: No Shortness of breath?: No  Endocrine Excessive thirst?: No  Musculoskeletal Back pain?: No Joint pain?: No  Neurological Headaches?: No Dizziness?: No  Psychologic Depression?: Yes Anxiety?: Yes  Physical Exam: BP  115/70   Pulse 80   Constitutional:  Alert and oriented, No acute distress.  Lying on stretcher.  Minimally communicative.  Accompanied by son and husband. HEENT: [place] AT, moist mucus membranes.  Trachea midline, no masses. Respiratory: Normal respiratory effort, no increased work of breathing. GI: Abdomen is soft, nontender, nondistended GU: Foley in place draining clear yellow urine Skin: No rashes, bruises or suspicious lesions.Marland Kitchen. Psychiatric: Normal mood and affect.  Laboratory Data: Lab Results  Component Value Date   WBC 7.5 02/06/2017   HGB 9.4 (L) 02/06/2017   HCT 28.5 (L) 02/06/2017   MCV 80.3 02/06/2017   PLT 218 02/06/2017    Lab Results  Component Value Date   CREATININE 0.64 02/11/2017    Lab Results  Component  Value Date   HGBA1C 5.7 (H) 07/06/2016    Urinalysis N/a  Pertinent Imaging: CLINICAL DATA:  Indeterminate liver lesion  EXAM: CT ABDOMEN WITHOUT AND WITH CONTRAST  TECHNIQUE: Multidetector CT imaging of the abdomen was performed following the standard protocol before and following the bolus administration of intravenous contrast.  CONTRAST:  100mL ISOVUE-300 IOPAMIDOL (ISOVUE-300) INJECTION 61%  COMPARISON:  CT 01/22/2017  FINDINGS: Lower chest: Bibasilar pleural effusions and passive atelectasis similar comparison exam.  Hepatobiliary: No focal hepatic lesion.  Gallbladder collapsed.  Pancreas: Normal pancreatic parenchymal. No duct dilatation). Mild inflammation of the tail the pancreas (image 31, series 6). Mild parenchymal hypoperfusion at the tail the pancreas.  Spleen: Normal  Adrenals/urinary tract: Adrenal glands are normal.  Exophytic from the lower pole of the LEFT kidney is an ovoid intermediate density lesion measuring 28 x 20 mm (image 42, series 2). On postcontrast imaging, lesion enhances uniformly and avidly (image series 6).  No additional enhancing renal lesions.  Similar lesion of the RIGHT hydronephrosis seen on comparison exam.  Stomach/Bowel: Stomach and limited view of the bowel is unremarkable  Vascular/Lymphatic: Abdominal aortic normal caliber. LEFT renal vein is normal. No retroperitoneal adenopathy.  Musculoskeletal: No aggressive osseous lesion. Compression fracture in the of prior thoracic spine likely posttraumatic  IMPRESSION: 1. Solid enhancing lesion exophytic from the lower pole of the LEFT kidney measuring 2.8 cm is most consistent with a renal cell carcinoma. 2. Inflammation at the tail of the pancreas suggests focal pancreatitis. Recommend biochemical correlation and follow-up contrast CT or MRI imaging to ensure resolution. 3. System bibasilar atelectasis and effusions.   Electronically Signed    By: Genevive BiStewart  Edmunds M.D.   On: 03/12/2017 14:33  CT scan was reviewed personally again today as well as reviewed with the patient, her son, and her husband in person.  Assessment & Plan:    1. Left renal mass A solid renal mass raises the suspicion of primary renal malignancy.  We discussed this in detail and in regards to the spectrum of renal masses which includes cysts (pure cysts are considered benign), solid masses and everything in between. The risk of metastasis increases as the size of solid renal mass increases. In general, it is believed that the risk of metastasis for renal masses less than 3-4 cm is small (up to approximately 5%) based mainly on large retrospective studies.  In some cases and especially in patients of older age and multiple comorbidities a surveillance approach may be appropriate. The treatment of solid renal masses includes: surveillance, cryoablation (percutaneous and laparoscopic) in addition to partial and complete nephrectomy (each with option of laparoscopic, robotic and open depending on appropriateness). Furthermore, nephrectomy appears to be an independent risk factor for  the development of chronic kidney disease suggesting that nephron sparing approaches should be implored whenever feasible. We reviewed these options in context of the patients current situation as well as the pros and cons of each.  Given her comorbidities, I believe that surveillance versus cryoablation is the best option.  She would like to be referred to interventional radiology to discuss this as an option.  We did briefly review the risk and benefits of cryoablation.  - IR Radiologist Eval & Mgmt; Future  2. Urinary retention Chronic urinary retention, likely multifactorial Reviewed role of urodynamics to assess bladder function, declined Discuss options for bladder management including continued indwelling Foley catheter, suprapubic tube, and clean intermittent catheterization Risk  and benefits of each were discussed She would like to continue chronic indwelling Foley for the time being with consideration of suprapubic tube in the future, they will let us know if they like to proceed with this  3. Renal stone Small nonobstructing upper tract stones, given her medical comorbidities and overall condition, would recommend deferring further treatment of the stones with consideration of treatment in the future should the size increase Asymptomatic currently  4. History of systemic candidiasis Managed by infectious disease, Dr. Sampson GoonFitzgerald Suspect lower urinary tract source   Return in about 6 months (around 09/28/2017) for please change primary care MD in chart.  Vanna ScotlandAshley Chasity Outten, MD  [facility] [address], [address] [place], [place] [address] [phone]  I spent 40 min with this patient of which greater than 50% was spent in counseling and coordination of care with the patient.

## 2017-05-27 ENCOUNTER — Encounter: Payer: Medicare Other | Attending: Nurse Practitioner | Admitting: Nurse Practitioner

## 2017-05-27 DIAGNOSIS — G894 Chronic pain syndrome: Secondary | ICD-10-CM | POA: Insufficient documentation

## 2017-05-27 DIAGNOSIS — Z8249 Family history of ischemic heart disease and other diseases of the circulatory system: Secondary | ICD-10-CM | POA: Insufficient documentation

## 2017-05-27 DIAGNOSIS — R54 Age-related physical debility: Secondary | ICD-10-CM | POA: Diagnosis not present

## 2017-05-27 DIAGNOSIS — Z7401 Bed confinement status: Secondary | ICD-10-CM | POA: Insufficient documentation

## 2017-05-27 DIAGNOSIS — I890 Lymphedema, not elsewhere classified: Secondary | ICD-10-CM | POA: Insufficient documentation

## 2017-05-27 DIAGNOSIS — Z86718 Personal history of other venous thrombosis and embolism: Secondary | ICD-10-CM | POA: Insufficient documentation

## 2017-05-27 DIAGNOSIS — Z888 Allergy status to other drugs, medicaments and biological substances status: Secondary | ICD-10-CM | POA: Diagnosis not present

## 2017-05-27 DIAGNOSIS — L89153 Pressure ulcer of sacral region, stage 3: Secondary | ICD-10-CM | POA: Insufficient documentation

## 2017-05-27 DIAGNOSIS — G8250 Quadriplegia, unspecified: Secondary | ICD-10-CM | POA: Diagnosis not present

## 2017-05-27 DIAGNOSIS — L89890 Pressure ulcer of other site, unstageable: Secondary | ICD-10-CM | POA: Diagnosis not present

## 2017-05-27 DIAGNOSIS — M4800 Spinal stenosis, site unspecified: Secondary | ICD-10-CM | POA: Insufficient documentation

## 2017-05-27 DIAGNOSIS — L89154 Pressure ulcer of sacral region, stage 4: Secondary | ICD-10-CM | POA: Diagnosis present

## 2017-05-29 NOTE — Progress Notes (Signed)
DONETA, BAYMAN ([id number]) Visit Report for 05/27/2017 Abuse/Suicide Risk Screen Details [name], Jessica [name]. Date of Service: 05/27/2017 12:30 PM Medical Record Number: [id number] Patient Account Number: [id number] Date of Birth/Sex: [birth year] (72 y.o. Female) Treating RN: Renne Crigler Primary Care Leonetta Mcgivern: C[facility], [facility] Most Other Clinician: Referring Unice Vantassel: Annita Brod Treating Cera Rorke/Extender: Kathreen Cosier in Treatment: 0 Abuse/Suicide Risk Screen Items Answer ABUSE/SUICIDE RISK SCREEN: Has anyone close to you tried to hurt or harm you recentlyo No Do you feel uncomfortable with anyone in your familyo No Has anyone forced you do things that you didnot want to doo No Do you have any thoughts of harming yourselfo No Patient displays signs or symptoms of abuse and/or neglect. No Electronic Signature(s) Signed: 05/28/2017 1:58:36 PM By: Renne Crigler Entered By: Renne Crigler on 05/27/2017 12:58:48 Jessica [name] ([id number]) -------------------------------------------------------------------------------- Activities of Daily Living Details [name]. Date of Service: 05/27/2017 12:30 PM Medical Record Number: [id number] Patient Account Number: [id number] Date of Birth/Sex: 09-04-1945 (72 y.o. Female) Treating RN: Renne Crigler Primary Care Khalaya Mcgurn: [facility], [facility] Most Other Clinician: Referring Hoang Pettingill: Annita Brod Treating Cecille Mcclusky/Extender: Kathreen Cosier in Treatment: 0 Activities of Daily Living Items Answer Activities of Daily Living (Please select one for each item) Drive Automobile Completely Able Take Medications Completely Able Use Telephone Completely Able Care for Appearance Completely Able Use Toilet Completely Able Bath / Shower Completely Able Dress Self Completely Able Feed Self Completely Able Walk Completely Able Get In / Out Bed Completely Able Housework Completely Able Prepare  Meals Completely Able Handle Money Completely Able Shop for Self Completely Able Electronic Signature(s) Signed: 05/28/2017 1:58:36 PM By: Renne Crigler Entered By: Renne Crigler on 05/27/2017 12:59:11 Jessica [name] ([id number]) -------------------------------------------------------------------------------- Education Assessment Details [name]. Date of Service: 05/27/2017 12:30 PM Medical Record Number: [id number] Patient Account Number: [id number] Date of Birth/Sex: [birth year] (72 y.o. Female) Treating RN: Renne Crigler Primary Care Josephanthony Tindel: [facility], [facility] Most Other Clinician: Referring Eryn Marandola: Annita Brod Treating Carole Deere/Extender: Kathreen Cosier in Treatment: 0 Primary Learner Assessed: Caregiver son and husband Learning Preferences/Education Level/Primary Language Learning Preference: Explanation Highest Education Level: High School Preferred Language: English Cognitive Barrier Assessment/Beliefs Language Barrier: No Translator Needed: No Memory Deficit: No Emotional Barrier: No Cultural/Religious Beliefs Affecting Medical Care: No Physical Barrier Assessment Impaired Vision: Yes Glasses Impaired Hearing: No Knowledge/Comprehension Assessment Knowledge Level: High Comprehension Level: High Ability to understand written High instructions: Ability to understand verbal High instructions: Motivation Assessment Anxiety Level: Calm Cooperation: Cooperative Education Importance: Acknowledges Need Interest in Health Problems: Asks Questions Perception: Coherent Willingness to Engage in Self- High Management Activities: Readiness to Engage in Self- High Management Activities: Electronic Signature(s) Signed: 05/28/2017 1:58:36 PM By: Renne Crigler Entered By: Renne Crigler on 05/27/2017 13:00:02 Jessica [name] ([id number]) -------------------------------------------------------------------------------- Fall Risk  Assessment Details [name]. Date of Service: 05/27/2017 12:30 PM Medical Record Number: [id number] Patient Account Number: [id number] Date of Birth/Sex: 01/23/1946 (72 y.o. Female) Treating RN: Renne Crigler Primary Care Gayna Braddy: [facility], [facility] Other Clinician: Referring Jamis Kryder: Annita Brod Treating Shay Bartoli/Extender: Kathreen Cosier in Treatment: 0 Fall Risk Assessment Items Have you had 2 or more falls in the last 12 monthso 0 No Have you had any fall that resulted in injury in the last 12 monthso 0 No FALL RISK ASSESSMENT: History of falling - immediate or within 3 months 0 No Secondary diagnosis 0 No Ambulatory aid None/bed rest/wheelchair/nurse 0 Yes Crutches/cane/walker 0 No Furniture 0 No IV Access/Saline Lock 0  No Gait/Training Normal/bed rest/immobile 0 Yes Weak 0 No Impaired 0 No Mental Status Oriented to own ability 0 No Electronic Signature(s) Signed: 05/28/2017 1:58:36 PM By: Renne CriglerFlinchum, Cheryl Entered By: Renne CriglerFlinchum, Cheryl on 05/27/2017 13:00:15 Jessica BradyVERMAN, Lavette W. ([id number]) -------------------------------------------------------------------------------- Nutrition Risk Assessment Details Patient Name: Jessica BradyVERMAN, Alyzabeth W. Date of Service: 05/27/2017 12:30 PM Medical Record Number: [id number] Patient Account Number: [id number] Date of Birth/Sex: Apr 04, 1946 (72 y.o. Female) Treating RN: Renne CriglerFlinchum, Cheryl Primary Care Burdell Peed: CENTER, Leonette MostHARLES Other Clinician: Referring Kelven Flater: Annita BrodASENSO, PHILIP Treating Jesseca Marsch/Extender: Kathreen Cosieroulter, Leah Weeks in Treatment: 0 Height (in): Weight (lbs): Body Mass Index (BMI): Nutrition Risk Assessment Items NUTRITION RISK SCREEN: I have an illness or condition that made me change the kind and/or amount of 0 No food I eat I eat fewer than two meals per day 0 No I eat few fruits and vegetables, or milk products 0 No I have three or more drinks of beer, liquor or wine almost every day 0 No I have  tooth or mouth problems that make it hard for me to eat 0 No I don't always have enough money to buy the food I need 0 No I eat alone most of the time 0 No I take three or more different prescribed or over-the-counter drugs a day 0 No Without wanting to, I have lost or gained 10 pounds in the last six months 0 No I am not always physically able to shop, cook and/or feed myself 0 No Nutrition Protocols Good Risk Protocol 0 No interventions needed Moderate Risk Protocol Electronic Signature(s) Signed: 05/28/2017 1:58:36 PM By: Renne CriglerFlinchum, Cheryl Entered By: Renne CriglerFlinchum, Cheryl on 05/27/2017 13:00:30

## 2017-05-29 NOTE — Progress Notes (Signed)
Jessica [name], [name] ([id number]) Visit Report for 05/27/2017 Chief Complaint Document Details [name], [name] Date of Service: 05/27/2017 12:30 PM Medical Record Number: [id number] Patient Account Number: [id number] Date of Birth/Sex: 05/31/1945 (71 y.o. Female) Treating RN: Renne CriglerFlinchum, Cheryl Primary Care Provider: CENTER, Leonette MostHARLES Other Clinician: Referring Provider: Annita BrodASENSO, PHILIP Treating Provider/Extender: Kathreen Cosieroulter, Juanisha Bautch Weeks in Treatment: 0 Information Obtained from: Patient Chief Complaint she is here for evaluation of a left-sided sacral ulcer and bilateral lower extremity ulcers Electronic Signature(s) Signed: 05/27/2017 2:54:36 PM By: Bonnell Publicoulter, Dagen Beevers Entered By: Bonnell Publicoulter, Rinda Rollyson on 05/27/2017 14:54:36 Jessica [name], [name] ([id number]) -------------------------------------------------------------------------------- Debridement Details [name], [name] Date of Service: 05/27/2017 12:30 PM Medical Record Number: [id number] Patient Account Number: [id number] Date of Birth/Sex: 05/31/1945 (71 y.o. Female) Treating RN: Renne CriglerFlinchum, Cheryl Primary Care Provider: CENTER, CHARLES Other Clinician: Referring Provider: Annita BrodASENSO, PHILIP Treating Provider/Extender: Kathreen Cosieroulter, Gera Inboden Weeks in Treatment: 0 Debridement Performed for Wound #1 Medial Sacrum Assessment: Performed By: Physician Bonnell Publicoulter, Amandeep Hogston, NP Debridement: Debridement Pre-procedure Verification/Time Yes - 01:39 Out Taken: Start Time: 01:39 Pain Control: Other : LIDOCAINE 4% Level: Skin/Subcutaneous Tissue Total Area Debrided (L x W): 4.5 (cm) x 3.5 (cm) = 15.75 (cm) Tissue and other material Viable, Non-Viable, Fibrin/Slough, Skin, Subcutaneous debrided: Instrument: Curette Bleeding: Minimum Hemostasis Achieved: Pressure End Time: 01:39 Procedural Pain: 0 Post Procedural Pain: 0 Response to Treatment: Procedure was tolerated well Post Debridement Measurements of Total Wound Length: (cm)  9.2 Stage: Category/Stage IV Width: (cm) 5.1 Depth: (cm) 1.1 Volume: (cm) 40.536 Character of Wound/Ulcer Post Stable Debridement: Post Procedure Diagnosis Same as Pre-procedure Electronic Signature(s) Signed: 05/27/2017 2:54:05 PM By: Bonnell Publicoulter, Peyton Rossner Signed: 05/28/2017 1:58:36 PM By: Renne CriglerFlinchum, Cheryl Entered By: Bonnell Publicoulter, Kamrynn Melott on 05/27/2017 14:54:04 Jessica [name], [name] ([id number]) -------------------------------------------------------------------------------- HPI Details [name], [name] Date of Service: 05/27/2017 12:30 PM Medical Record Number: [id number] Patient Account Number: [id number] Date of Birth/Sex: 05/31/1945 (71 y.o. Female) Treating RN: Renne CriglerFlinchum, Cheryl Primary Care Provider: CENTER, Leonette MostHARLES Other Clinician: Referring Provider: Annita BrodASENSO, PHILIP Treating Provider/Extender: Kathreen Cosieroulter, Kamrie Fanton Weeks in Treatment: 0 History of Present Illness HPI Description: 05/27/17-she is here in initial evaluation for a left-sided sacral stage IV pressure ulcer and bilateral lower extremity, lateral aspect, unstageable pressure ulcers. She is accompanied by her husband and her son, who are her primary caregivers. She is bedbound secondary to spinal stenosis. According to her son and husband she was hospitalized from 10/5-11/1 with healthcare associated pneumonia and altered mental status. During her hospitalization she was intubated, extubated on 10/27. She was discharged with Foley catheter and follow up with urology. According to her son and spouse she developed the sacral ulcer during hospitalization. Home health has been applying Santyl daily. She does have a low air loss mattress and is repositioned every 2-3 hours per her family's report. According to the son and spouse she had an appointment with urology on 12/18 and during that appointment developed discoloration to her bilateral lower extremities which ultimately developed into unstageable pressure ulcers to the  lateral aspects of her bilateral lower extremities. There has been no topical treatment applied to these. She continues to have home health. There is no concerns expressed regarding dietary intake, stating she eats 3 meals a day, eating was provided; she is supplemented with boost with protein. Electronic Signature(s) Signed: 05/27/2017 3:23:33 PM By: Bonnell Publicoulter, Deoni Cosey Previous Signature: 05/27/2017 3:14:47 PM Version By: Bonnell Publicoulter, Hagan Vanauken Entered By: Bonnell Publicoulter, Venia Riveron on 05/27/2017 15:23:33 Jessica [name], Delainee W. ([id number]) -------------------------------------------------------------------------------- Physical Exam Details [name], [name] Date of Service:  05/27/2017 12:30 PM Medical Record Number: [id number] Patient Account Number: [id number] Date of Birth/Sex: 11-Sep-1945 (71 y.o. Female) Treating RN: Renne Crigler Primary Care Provider: CENTER, Leonette Most Other Clinician: Referring Provider: Annita Brod Treating Provider/Extender: Kathreen Cosier in Treatment: 0 Respiratory non-labored. clear to auscultation. Cardiovascular s1 s2 regular, rate controlled. BLE- palpable DP. BLE- warm to touch, no hemosiderin deposits, trace edema. Musculoskeletal nonambulatory, bed bound at baseline. Electronic Signature(s) Signed: 05/27/2017 4:07:22 PM By: Bonnell Public Entered By: Bonnell Public on 05/27/2017 16:07:22 Jessica Brady ([id number]) -------------------------------------------------------------------------------- Physician Orders Details Patient Name: Jessica Brady. Date of Service: 05/27/2017 12:30 PM Medical Record Number: [id number] Patient Account Number: [id number] Date of Birth/Sex: [birth year] (71 y.o. Female) Treating RN: Renne Crigler Primary Care Provider: CENTER, Leonette Most Other Clinician: Referring Provider: Annita Brod Treating Provider/Extender: Kathreen Cosier in Treatment: 0 Verbal / Phone Orders: No Diagnosis Coding ICD-10 Coding Code  Description L89.154 Pressure ulcer of sacral region, stage 4 Wound Cleansing Wound #1 Medial Sacrum o Clean wound with Normal Saline. Anesthetic (add to Medication List) Wound #1 Medial Sacrum o Topical Lidocaine 4% cream applied to wound bed prior to debridement (In Clinic Only). Wound #2 Left Lower Leg o Topical Lidocaine 4% cream applied to wound bed prior to debridement (In Clinic Only). Wound #3 Right,Lateral Lower Leg o Topical Lidocaine 4% cream applied to wound bed prior to debridement (In Clinic Only). Primary Wound Dressing Wound #1 Medial Sacrum o Hydrafera Blue - TO WOUND , ONLY PROVIDE ONE WEEK AT A TIME UNTIL APPROVAL OF WOUND VAC APPROVAL Wound #2 Left Lower Leg o Santyl Ointment - PLACE ON OPEN WOUND AREAS ON LATERAL LOWER LEGS Wound #3 Right,Lateral Lower Leg o Santyl Ointment - PLACE ON OPEN WOUND AREAS ON LATERAL LOWER LEGS Secondary Dressing Wound #1 Medial Sacrum o ABD pad - TAPE TO SECURE o Dry Gauze Wound #2 Left Lower Leg o Boardered Foam Dressing Wound #3 Right,Lateral Lower Leg o Boardered Foam Dressing Dressing Change Frequency Wound #1 Medial Sacrum o Change Dressing Monday, Wednesday, Friday Jessica [name], DORFMAN. ([id number]) Wound #2 Left Lower Leg o Change dressing every day. Wound #3 Right,Lateral Lower Leg o Change dressing every day. Home Health Wound #1 Medial Sacrum o Initiate Home Health for Skilled Nursing o Continue Home Health Visits o Home Health Nurse may visit PRN to address patientos wound care needs. o FACE TO FACE ENCOUNTER: MEDICARE and MEDICAID PATIENTS: I certify that this patient is under my care and that I had a face-to-face encounter that meets the physician face-to-face encounter requirements with this patient on this date. The encounter with the patient was in whole or in part for the following MEDICAL CONDITION: (primary reason for Home Healthcare) MEDICAL NECESSITY: I certify, that  based on my findings, NURSING services are a medically necessary home health service. HOME BOUND STATUS: I certify that my clinical findings support that this patient is homebound (i.e., Due to illness or injury, pt requires aid of supportive devices such as crutches, cane, wheelchairs, walkers, the use of special transportation or the assistance of another person to leave their place of residence. There is a normal inability to leave the home and doing so requires considerable and taxing effort. Other absences are for medical reasons / religious services and are infrequent or of short duration when for other reasons). o If current dressing causes regression in wound condition, may D/C ordered dressing product/s and apply Normal Saline Moist Dressing daily until next Wound Healing Center / Other MD appointment. Notify  Wound Healing Center of regression in wound condition at [phone]. o Please direct any NON-WOUND related issues/requests for orders to patient's Primary Care Physician Wound #2 Left Lower Leg o Initiate Home Health for Skilled Nursing o Continue Home Health Visits o Home Health Nurse may visit PRN to address patientos wound care needs. o FACE TO FACE ENCOUNTER: MEDICARE and MEDICAID PATIENTS: I certify that this patient is under my care and that I had a face-to-face encounter that meets the physician face-to-face encounter requirements with this patient on this date. The encounter with the patient was in whole or in part for the following MEDICAL CONDITION: (primary reason for Home Healthcare) MEDICAL NECESSITY: I certify, that based on my findings, NURSING services are a medically necessary home health service. HOME BOUND STATUS: I certify that my clinical findings support that this patient is homebound (i.e., Due to illness or injury, pt requires aid of supportive devices such as crutches, cane, wheelchairs, walkers, the use of special transportation or  the assistance of another person to leave their place of residence. There is a normal inability to leave the home and doing so requires considerable and taxing effort. Other absences are for medical reasons / religious services and are infrequent or of short duration when for other reasons). o If current dressing causes regression in wound condition, may D/C ordered dressing product/s and apply Normal Saline Moist Dressing daily until next Wound Healing Center / Other MD appointment. Notify Wound Healing Center of regression in wound condition at [phone]. o Please direct any NON-WOUND related issues/requests for orders to patient's Primary Care Physician Wound #3 Right,Lateral Lower Leg o Initiate Home Health for Skilled Nursing o Continue Home Health Visits o Home Health Nurse may visit PRN to address patientos wound care needs. o FACE TO FACE ENCOUNTER: MEDICARE and MEDICAID PATIENTS: I certify that this patient is under my care and that I had a face-to-face encounter that meets the physician face-to-face encounter requirements with this patient on this date. The encounter with the patient was in whole or in part for the following MEDICAL CONDITION: (primary reason for Home Healthcare) MEDICAL NECESSITY: I certify, that based on my findings, NURSING services are a medically necessary home health service. HOME BOUND STATUS: I certify that my clinical findings support that this patient is homebound (i.e., Due to illness or injury, pt requires aid of supportive devices such as crutches, cane, wheelchairs, walkers, the use of special transportation or the assistance of another person to leave their place of residence. There is a normal inability to leave the home GLADYS, DECKARD. ([id number]) and doing so requires considerable and taxing effort. Other absences are for medical reasons / religious services and are infrequent or of short duration when for other reasons). o If  current dressing causes regression in wound condition, may D/C ordered dressing product/s and apply Normal Saline Moist Dressing daily until next Wound Healing Center / Other MD appointment. Notify Wound Healing Center of regression in wound condition at [phone]. o Please direct any NON-WOUND related issues/requests for orders to patient's Primary Care Physician Radiology o X-ray, coccyx - (ICD10 L89.154 - Pressure ulcer of sacral region, stage 4) Electronic Signature(s) Signed: 05/27/2017 5:17:02 PM By: Bonnell Public Previous Signature: 05/27/2017 2:47:26 PM Version By: Bonnell Public Entered By: Bonnell Public on 05/27/2017 16:07:38 Jessica Brady ([id number]) -------------------------------------------------------------------------------- Problem List Details Patient Name: Jessica Brady. Date of Service: 05/27/2017 12:30 PM Medical Record Number: [id number] Patient Account Number: [id number] Date of Birth/Sex: 04-06-1946 (71  y.o. Female) Treating RN: Renne Crigler Primary Care Provider: CENTER, Leonette Most Other Clinician: Referring Provider: Annita Brod Treating Provider/Extender: Kathreen Cosier in Treatment: 0 Active Problems ICD-10 Encounter Code Description Active Date Diagnosis L89.154 Pressure ulcer of sacral region, stage 4 05/27/2017 Yes M48.00 Spinal stenosis, site unspecified 05/27/2017 Yes R54 Age-related physical debility 05/27/2017 Yes G89.4 Chronic pain syndrome 05/27/2017 Yes E78.2 Mixed hyperlipidemia 05/27/2017 Yes L89.95 Pressure ulcer of unspecified site, unstageable 05/27/2017 Yes Inactive Problems Resolved Problems Electronic Signature(s) Signed: 05/27/2017 2:53:33 PM By: Bonnell Public Previous Signature: 05/27/2017 2:52:35 PM Version By: Bonnell Public Entered By: Bonnell Public on 05/27/2017 14:53:32 Jessica Brady ([id number]) -------------------------------------------------------------------------------- Progress Note Details Patient  Name: Jessica Brady. Date of Service: 05/27/2017 12:30 PM Medical Record Number: [id number] Patient Account Number: [id number] Date of Birth/Sex: June 07, 1945 (71 y.o. Female) Treating RN: Renne Crigler Primary Care Provider: CENTER, Leonette Most Other Clinician: Referring Provider: Annita Brod Treating Provider/Extender: Kathreen Cosier in Treatment: 0 Subjective Chief Complaint Information obtained from Patient she is here for evaluation of a left-sided sacral ulcer and bilateral lower extremity ulcers History of Present Illness (HPI) 05/27/17-she is here in initial evaluation for a left-sided sacral stage IV pressure ulcer and bilateral lower extremity, lateral aspect, unstageable pressure ulcers. She is accompanied by her husband and her son, who are her primary caregivers. She is bedbound secondary to spinal stenosis. According to her son and husband she was hospitalized from 10/5-11/1 with healthcare associated pneumonia and altered mental status. During her hospitalization she was intubated, extubated on 10/27. She was discharged with Foley catheter and follow up with urology. According to her son and spouse she developed the sacral ulcer during hospitalization. Home health has been applying Santyl daily. She does have a low air loss mattress and is repositioned every 2-3 hours per her family's report. According to the son and spouse she had an appointment with urology on 12/18 and during that appointment developed discoloration to her bilateral lower extremities which ultimately developed into unstageable pressure ulcers to the lateral aspects of her bilateral lower extremities. There has been no topical treatment applied to these. She continues to have home health. There is no concerns expressed regarding dietary intake, stating she eats 3 meals a day, eating was provided; she is supplemented with boost with protein. Wound History Patient presents with 3 open wounds. Patient  has been treating wounds in the following manner: 3 months. Laboratory tests have not been performed in the last month. Patient reportedly has not tested positive for an antibiotic resistant organism. Patient History Allergies zinc, Ambien Family History Hypertension - Father, Lung Disease - Father, No family history of Cancer, Diabetes, Heart Disease, Kidney Disease, Seizures, Stroke, Thyroid Problems, Tuberculosis. Social History Never smoker, Marital Status - Married, Alcohol Use - Never, Drug Use - No History, Caffeine Use - Daily. Medical History Eyes Denies history of Cataracts, Glaucoma, Optic Neuritis Hematologic/Lymphatic Patient has history of Anemia, Lymphedema Denies history of Hemophilia, Human Immunodeficiency Virus, Sickle Cell Disease Respiratory Denies history of Aspiration, Asthma, Chronic Obstructive Pulmonary Disease (COPD), Pneumothorax, Sleep Apnea, Tuberculosis Cardiovascular Patient has history of Deep Vein Thrombosis ANGELIYAH, KIRKEY. ([id number]) Denies history of Angina, Arrhythmia, Congestive Heart Failure, Coronary Artery Disease, Hypertension, Hypotension, Myocardial Infarction Gastrointestinal Denies history of Cirrhosis , Colitis, Crohn s, Hepatitis A, Hepatitis B, Hepatitis C Endocrine Denies history of Type I Diabetes, Type II Diabetes Genitourinary Denies history of End Stage Renal Disease Immunological Denies history of Lupus Erythematosus, Raynaud s, Scleroderma Integumentary (Skin) Denies history of  History of Burn, History of pressure wounds Musculoskeletal Denies history of Gout, Rheumatoid Arthritis, Osteoarthritis, Osteomyelitis Neurologic Patient has history of Quadriplegia Denies history of Dementia, Neuropathy, Paraplegia, Seizure Disorder Psychiatric Denies history of Anorexia/bulimia, Confinement Anxiety Hospitalization/Surgery History - 01/15/2017, [facility], UTI and yeats infection with pneumonia. Review of Systems  (ROS) Constitutional Symptoms (General Health) The patient has no complaints or symptoms. Eyes Complains or has symptoms of Glasses / Contacts - glasses. Denies complaints or symptoms of Dry Eyes, Vision Changes. Ear/Nose/Mouth/Throat The patient has no complaints or symptoms. Hematologic/Lymphatic Complains or has symptoms of Bleeding / Clotting Disorders. Denies complaints or symptoms of Human Immunodeficiency Virus. Respiratory Denies complaints or symptoms of Chronic or frequent coughs, Shortness of Breath. Cardiovascular Complains or has symptoms of LE edema. Denies complaints or symptoms of Chest pain. Gastrointestinal Denies complaints or symptoms of Frequent diarrhea, Nausea, Vomiting. Endocrine Complains or has symptoms of Thyroid disease. Denies complaints or symptoms of Hepatitis, Polydypsia (Excessive Thirst). Genitourinary Complains or has symptoms of Incontinence/dribbling. Denies complaints or symptoms of Kidney failure/ Dialysis. Immunological Denies complaints or symptoms of Hives, Itching. Integumentary (Skin) Complains or has symptoms of Wounds, Bleeding or bruising tendency, Swelling. Denies complaints or symptoms of Breakdown. Musculoskeletal Denies complaints or symptoms of Muscle Pain, Muscle Weakness. Neurologic Complains or has symptoms of Numbness/parasthesias - extremities biateral numbness, spinal stenosis Oncologic The patient has no complaints or symptoms. Psychiatric Complains or has symptoms of Anxiety. Denies complaints or symptoms of Claustrophobia. KEYATTA, TOLLES ([id number]) Objective Respiratory non-labored. clear to auscultation. Cardiovascular s1 s2 regular, rate controlled. BLE- palpable DP. BLE- warm to touch, no hemosiderin deposits, trace edema. Musculoskeletal nonambulatory, bed bound at baseline. Integumentary (Hair, Skin) Wound #1 status is Open. Original cause of wound was Pressure Injury. The wound is located on the  Medial Sacrum. The wound measures 9.2cm length x 5.1cm width x 1cm depth; 36.851cm^2 area and 36.851cm^3 volume. There is muscle and Fat Layer (Subcutaneous Tissue) Exposed exposed. There is no tunneling noted, however, there is undermining starting at 9:00 and ending at 2:00 with a maximum distance of 2.4cm. There is a large amount of serous drainage noted. The wound margin is distinct with the outline attached to the wound base. There is large (67-100%) red granulation within the wound bed. There is a small (1-33%) amount of necrotic tissue within the wound bed including Adherent Slough. The periwound skin appearance exhibited: Induration. The periwound skin appearance did not exhibit: Callus, Crepitus, Excoriation, Rash, Scarring, Dry/Scaly, Maceration, Atrophie Blanche, Cyanosis, Ecchymosis, Hemosiderin Staining, Mottled, Pallor, Rubor, Erythema. Periwound temperature was noted as No Abnormality. The periwound has tenderness on palpation. Wound #2 status is Open. Original cause of wound was Gradually Appeared. The wound is located on the Left Lower Leg. The wound measures 1cm length x 0.6cm width x 0.2cm depth; 0.471cm^2 area and 0.094cm^3 volume. There is no tunneling or undermining noted. There is a medium amount of serous drainage noted. The wound margin is flat and intact. There is large (67-100%) red granulation within the wound bed. There is a small (1-33%) amount of necrotic tissue within the wound bed including Eschar. The periwound skin appearance did not exhibit: Callus, Crepitus, Excoriation, Induration, Rash, Scarring, Dry/Scaly, Maceration, Atrophie Blanche, Cyanosis, Ecchymosis, Hemosiderin Staining, Mottled, Pallor, Rubor, Erythema. Periwound temperature was noted as No Abnormality. Wound #3 status is Open. Original cause of wound was Gradually Appeared. The wound is located on the Right,Lateral Lower Leg. The wound measures 0.6cm length x 0.6cm width x 0.1cm depth; 0.283cm^2 area  and 0.028cm^3 volume. There is no tunneling or undermining noted. There is a none present amount of drainage noted. The wound margin is flat and intact. There is large (67-100%) red granulation within the wound bed. There is a small (1-33%) amount of necrotic tissue within the wound bed including Eschar. Assessment Active Problems ICD-10 L89.154 - Pressure ulcer of sacral region, stage 4 M48.00 - Spinal stenosis, site unspecified R54 - Age-related physical debility ANETRA, CZERWINSKI. ([id number]) G89.4 - Chronic pain syndrome E78.2 - Mixed hyperlipidemia L89.95 - Pressure ulcer of unspecified site, unstageable Procedures Wound #1 Pre-procedure diagnosis of Wound #1 is a Pressure Ulcer located on the Medial Sacrum . There was a Skin/Subcutaneous Tissue Debridement ([id number]) debridement with total area of 15.75 sq cm performed by Bonnell Public, NP. with the following instrument(s): Curette to remove Viable and Non-Viable tissue/material including Fibrin/Slough, Skin, and Subcutaneous after achieving pain control using Other (LIDOCAINE 4%). A time out was conducted at 01:39, prior to the start of the procedure. A Minimum amount of bleeding was controlled with Pressure. The procedure was tolerated well with a pain level of 0 throughout and a pain level of 0 following the procedure. Post Debridement Measurements: 9.2cm length x 5.1cm width x 1.1cm depth; 40.536cm^3 volume. Post debridement Stage noted as Category/Stage IV. Character of Wound/Ulcer Post Debridement is stable. Post procedure Diagnosis Wound #1: Same as Pre-Procedure Plan Wound Cleansing: Wound #1 Medial Sacrum: Clean wound with Normal Saline. Anesthetic (add to Medication List): Wound #1 Medial Sacrum: Topical Lidocaine 4% cream applied to wound bed prior to debridement (In Clinic Only). Wound #2 Left Lower Leg: Topical Lidocaine 4% cream applied to wound bed prior to debridement (In Clinic Only). Wound #3  Right,Lateral Lower Leg: Topical Lidocaine 4% cream applied to wound bed prior to debridement (In Clinic Only). Primary Wound Dressing: Wound #1 Medial Sacrum: Hydrafera Blue - TO WOUND , ONLY PROVIDE ONE WEEK AT A TIME UNTIL APPROVAL OF WOUND VAC APPROVAL Wound #2 Left Lower Leg: Santyl Ointment - PLACE ON OPEN WOUND AREAS ON LATERAL LOWER LEGS Wound #3 Right,Lateral Lower Leg: Santyl Ointment - PLACE ON OPEN WOUND AREAS ON LATERAL LOWER LEGS Secondary Dressing: Wound #1 Medial Sacrum: ABD pad - TAPE TO SECURE Dry Gauze Wound #2 Left Lower Leg: Boardered Foam Dressing Wound #3 Right,Lateral Lower Leg: Boardered Foam Dressing Dressing Change Frequency: Wound #1 Medial Sacrum: Change Dressing Monday, Wednesday, Friday Wound #2 Left Lower Leg: EMMAJEAN, RATLEDGE. ([id number]) Change dressing every day. Wound #3 Right,Lateral Lower Leg: Change dressing every day. Home Health: Wound #1 Medial Sacrum: Initiate Home Health for Skilled Nursing Continue Home Health Visits Home Health Nurse may visit PRN to address patient s wound care needs. FACE TO FACE ENCOUNTER: MEDICARE and MEDICAID PATIENTS: I certify that this patient is under my care and that I had a face-to-face encounter that meets the physician face-to-face encounter requirements with this patient on this date. The encounter with the patient was in whole or in part for the following MEDICAL CONDITION: (primary reason for Home Healthcare) MEDICAL NECESSITY: I certify, that based on my findings, NURSING services are a medically necessary home health service. HOME BOUND STATUS: I certify that my clinical findings support that this patient is homebound (i.e., Due to illness or injury, pt requires aid of supportive devices such as crutches, cane, wheelchairs, walkers, the use of special transportation or the assistance of another person to leave their place of residence. There is a normal inability to leave the home and  doing so  requires considerable and taxing effort. Other absences are for medical reasons / religious services and are infrequent or of short duration when for other reasons). If current dressing causes regression in wound condition, may D/C ordered dressing product/s and apply Normal Saline Moist Dressing daily until next Wound Healing Center / Other MD appointment. Notify Wound Healing Center of regression in wound condition at [phone]. Please direct any NON-WOUND related issues/requests for orders to patient's Primary Care Physician Wound #2 Left Lower Leg: Initiate Home Health for Skilled Nursing Continue Home Health Visits Home Health Nurse may visit PRN to address patient s wound care needs. FACE TO FACE ENCOUNTER: MEDICARE and MEDICAID PATIENTS: I certify that this patient is under my care and that I had a face-to-face encounter that meets the physician face-to-face encounter requirements with this patient on this date. The encounter with the patient was in whole or in part for the following MEDICAL CONDITION: (primary reason for Home Healthcare) MEDICAL NECESSITY: I certify, that based on my findings, NURSING services are a medically necessary home health service. HOME BOUND STATUS: I certify that my clinical findings support that this patient is homebound (i.e., Due to illness or injury, pt requires aid of supportive devices such as crutches, cane, wheelchairs, walkers, the use of special transportation or the assistance of another person to leave their place of residence. There is a normal inability to leave the home and doing so requires considerable and taxing effort. Other absences are for medical reasons / religious services and are infrequent or of short duration when for other reasons). If current dressing causes regression in wound condition, may D/C ordered dressing product/s and apply Normal Saline Moist Dressing daily until next Wound Healing Center / Other MD appointment. Notify  Wound Healing Center of regression in wound condition at [phone]. Please direct any NON-WOUND related issues/requests for orders to patient's Primary Care Physician Wound #3 Right,Lateral Lower Leg: Initiate Home Health for Skilled Nursing Continue Home Health Visits Home Health Nurse may visit PRN to address patient s wound care needs. FACE TO FACE ENCOUNTER: MEDICARE and MEDICAID PATIENTS: I certify that this patient is under my care and that I had a face-to-face encounter that meets the physician face-to-face encounter requirements with this patient on this date. The encounter with the patient was in whole or in part for the following MEDICAL CONDITION: (primary reason for Home Healthcare) MEDICAL NECESSITY: I certify, that based on my findings, NURSING services are a medically necessary home health service. HOME BOUND STATUS: I certify that my clinical findings support that this patient is homebound (i.e., Due to illness or injury, pt requires aid of supportive devices such as crutches, cane, wheelchairs, walkers, the use of special transportation or the assistance of another person to leave their place of residence. There is a normal inability to leave the home and doing so requires considerable and taxing effort. Other absences are for medical reasons / religious services and are infrequent or of short duration when for other reasons). If current dressing causes regression in wound condition, may D/C ordered dressing product/s and apply Normal Saline Moist Dressing daily until next Wound Healing Center / Other MD appointment. Notify Wound Healing Center of regression in wound condition at [phone]. Please direct any NON-WOUND related issues/requests for orders to patient's Primary Care Physician Radiology ordered were: X-ray, coccyx KAYTLYN, DIN. ([id number]) 1. Hydrofera Blue to sacral ulcer 3 times weekly 2. Santyl ointment to bilateral lower extremity ulcers daily 3.  Negative  pressure wound therapy system, will initiate when approved and available 4. Follow-up next week Electronic Signature(s) Signed: 05/27/2017 4:32:44 PM By: Bonnell Public Entered By: Bonnell Public on 05/27/2017 16:32:43 Jessica Brady ([id number]) -------------------------------------------------------------------------------- ROS/PFSH Details Patient Name: Jessica Brady. Date of Service: 05/27/2017 12:30 PM Medical Record Number: [id number] Patient Account Number: [id number] Date of Birth/Sex: 07-Oct-1945 (71 y.o. Female) Treating RN: Renne Crigler Primary Care Provider: [facility], Leonette Most Other Clinician: Referring Provider: Annita Brod Treating Provider/Extender: Kathreen Cosier in Treatment: 0 Wound History Do you currently have one or more open woundso Yes How many open wounds do you currently haveo 3 How have you been treating your wound(s) until nowo 3 months Has your wound(s) ever healed and then re-openedo No Have you had any lab work done in the past montho No Have you tested positive for an antibiotic resistant organism (MRSA, VRE)o No Eyes Complaints and Symptoms: Positive for: Glasses / Contacts - glasses Negative for: Dry Eyes; Vision Changes Medical History: Negative for: Cataracts; Glaucoma; Optic Neuritis Hematologic/Lymphatic Complaints and Symptoms: Positive for: Bleeding / Clotting Disorders Negative for: Human Immunodeficiency Virus Medical History: Positive for: Anemia; Lymphedema Negative for: Hemophilia; Human Immunodeficiency Virus; Sickle Cell Disease Respiratory Complaints and Symptoms: Negative for: Chronic or frequent coughs; Shortness of Breath Medical History: Negative for: Aspiration; Asthma; Chronic Obstructive Pulmonary Disease (COPD); Pneumothorax; Sleep Apnea; Tuberculosis Cardiovascular Complaints and Symptoms: Positive for: LE edema Negative for: Chest pain Medical History: Positive for: Deep Vein  Thrombosis Negative for: Angina; Arrhythmia; Congestive Heart Failure; Coronary Artery Disease; Hypertension; Hypotension; Myocardial Infarction Gastrointestinal Complaints and Symptoms: Negative for: Frequent diarrhea; Nausea; Vomiting AARYANNA, HYDEN. ([id number]) Medical History: Negative for: Cirrhosis ; Colitis; Crohnos; Hepatitis A; Hepatitis B; Hepatitis C Endocrine Complaints and Symptoms: Positive for: Thyroid disease Negative for: Hepatitis; Polydypsia (Excessive Thirst) Medical History: Negative for: Type I Diabetes; Type II Diabetes Genitourinary Complaints and Symptoms: Positive for: Incontinence/dribbling Negative for: Kidney failure/ Dialysis Medical History: Negative for: End Stage Renal Disease Immunological Complaints and Symptoms: Negative for: Hives; Itching Medical History: Negative for: Lupus Erythematosus; Raynaudos; Scleroderma Integumentary (Skin) Complaints and Symptoms: Positive for: Wounds; Bleeding or bruising tendency; Swelling Negative for: Breakdown Medical History: Negative for: History of Burn; History of pressure wounds Musculoskeletal Complaints and Symptoms: Negative for: Muscle Pain; Muscle Weakness Medical History: Negative for: Gout; Rheumatoid Arthritis; Osteoarthritis; Osteomyelitis Neurologic Complaints and Symptoms: Positive for: Numbness/parasthesias - extremities biateral numbness Review of System Notes: spinal stenosis Medical History: Positive for: Quadriplegia Negative for: Dementia; Neuropathy; Paraplegia; Seizure Disorder Psychiatric MADISON, DIRENZO ([id number]) Complaints and Symptoms: Positive for: Anxiety Negative for: Claustrophobia Medical History: Negative for: Anorexia/bulimia; Confinement Anxiety Constitutional Symptoms (General Health) Complaints and Symptoms: No Complaints or Symptoms Ear/Nose/Mouth/Throat Complaints and Symptoms: No Complaints or Symptoms Oncologic Complaints and  Symptoms: No Complaints or Symptoms Immunizations Pneumococcal Vaccine: Received Pneumococcal Vaccination: Yes Implantable Devices Hospitalization / Surgery History Name of Hospital Purpose of Hospitalization/Surgery Date [facility] UTI and yeats infection with pneumonia 01/15/2017 Family and Social History Cancer: No; Diabetes: No; Heart Disease: No; Hypertension: Yes - Father; Kidney Disease: No; Lung Disease: Yes - Father; Seizures: No; Stroke: No; Thyroid Problems: No; Tuberculosis: No; Never smoker; Marital Status - Married; Alcohol Use: Never; Drug Use: No History; Caffeine Use: Daily; Financial Concerns: No; Food, Clothing or Shelter Needs: No; Support System Lacking: No; Advanced Directives: No; Patient does not want information on Advanced Directives; Do not resuscitate: No; Living Will: No; Medical Power of Attorney: No Electronic Signature(s) Signed: 05/27/2017 5:17:02 PM By: Penne Lash,  Eidan Muellner Signed: 05/28/2017 1:58:36 PM By: Renne Crigler Entered By: Renne Crigler on 05/27/2017 12:58:10 Jessica Brady ([id number]) -------------------------------------------------------------------------------- SuperBill Details Patient Name: Jessica Brady. Date of Service: 05/27/2017 Medical Record Number: [id number] Patient Account Number: [id number] Date of Birth/Sex: Dec 03, 1945 (72 y.o. Female) Treating RN: Renne Crigler Primary Care Provider: [facility], Leonette Most Other Clinician: Referring Provider: Annita Brod Treating Provider/Extender: Kathreen Cosier in Treatment: 0 Diagnosis Coding ICD-10 Codes Code Description L89.154 Pressure ulcer of sacral region, stage 4 M48.00 Spinal stenosis, site unspecified R54 Age-related physical debility G89.4 Chronic pain syndrome E78.2 Mixed hyperlipidemia L89.95 Pressure ulcer of unspecified site, unstageable Facility Procedures CPT4 Code: [id number] Description: 11042 - DEB SUBQ TISSUE 20 SQ CM/< ICD-10 Diagnosis Description L89.154  Pressure ulcer of sacral region, stage 4 L89.95 Pressure ulcer of unspecified site, unstageable Modifier: Quantity: 1 Physician Procedures CPT4 Code: [id number] Description: WC PHYS LEVEL 3 o NEW PT ICD-10 Diagnosis Description L89.154 Pressure ulcer of sacral region, stage 4 L89.95 Pressure ulcer of unspecified site, unstageable M48.00 Spinal stenosis, site unspecified G89.4 Chronic pain syndrome Modifier: Quantity: 1 CPT4 Code: [id number] Description: 11042 - WC PHYS SUBQ TISS 20 SQ CM ICD-10 Diagnosis Description L89.154 Pressure ulcer of sacral region, stage 4 L89.95 Pressure ulcer of unspecified site, unstageable Modifier: Quantity: 1 Electronic Signature(s) Signed: 05/27/2017 4:33:13 PM By: Bonnell Public Entered By: Bonnell Public on 05/27/2017 16:33:12

## 2017-06-01 ENCOUNTER — Other Ambulatory Visit: Payer: Self-pay | Admitting: Nurse Practitioner

## 2017-06-01 DIAGNOSIS — L98429 Non-pressure chronic ulcer of back with unspecified severity: Secondary | ICD-10-CM

## 2017-06-01 NOTE — Progress Notes (Signed)
[name], [name] ([id number]) Visit Report for 05/27/2017 Allergy List Details [name], Jessica [name]. Date of Service: 05/27/2017 12:30 PM Medical Record Number: [id number] Patient Account Number: [id number] Date of Birth/Sex: [birth year] (71 y.o. Female) Treating RN: Renne Crigler Primary Care Lillyn Wieczorek: [facility], [facility] Other Clinician: Referring Alfons Sulkowski: Annita Brod Treating Fotini Lemus/Extender: Bonnell Public Weeks in Treatment: 0 Allergies Active Allergies zinc Ambien Allergy Notes Electronic Signature(s) Signed: 05/28/2017 1:58:36 PM By: Renne Crigler Entered By: Renne Crigler on 05/27/2017 12:42:33 Jessica [name] ([id number]) -------------------------------------------------------------------------------- Arrival Information Details [name]. Date of Service: 05/27/2017 12:30 PM Medical Record Number: [id number] Patient Account Number: [id number] Date of Birth/Sex: 09-30-1945 (71 y.o. Female) Treating RN: Renne Crigler Primary Care Kingston Shawgo: [facility], [facility] Other Clinician: Referring Karri Kallenbach: Annita Brod Treating Danyal Whitenack/Extender: Kathreen Cosier in Treatment: 0 Visit Information Patient Arrived: Stretcher Arrival Time: 12:41 Accompanied By: son and husband Transfer Assistance: Stretcher Patient Identification Verified: Yes Secondary Verification Process Yes Completed: Patient Has Alerts: Yes Patient Alerts: allergic to zinc Electronic Signature(s) Signed: 05/28/2017 1:58:36 PM By: Renne Crigler Entered By: Renne Crigler on 05/27/2017 12:41:59 Jessica [name] ([id number]) -------------------------------------------------------------------------------- Encounter Discharge Information Details [name]. Date of Service: 05/27/2017 12:30 PM Medical Record Number: [id number] Patient Account Number: [id number] Date of Birth/Sex: [birth year] (71 y.o. Female) Treating RN: Renne Crigler Primary Care Jazelle Achey: [facility], [facility] Other Clinician: Referring Lilyona Richner: Annita Brod Treating Gerron Guidotti/Extender: Kathreen Cosier in Treatment: 0 Encounter Discharge Information Items Schedule Follow-up Appointment: No Medication Reconciliation completed and No provided to Patient/Care Codi Folkerts: Provided on Clinical Summary of Care: 05/27/2017 Form Type Recipient Paper Patient PO Electronic Signature(s) Signed: 05/31/2017 4:58:43 PM By: Gwenlyn Perking Entered By: Gwenlyn Perking on 05/27/2017 14:27:02 Jessica [name] ([id number]) -------------------------------------------------------------------------------- Lower Extremity Assessment Details [name]. Date of Service: 05/27/2017 12:30 PM Medical Record Number: [id number] Patient Account Number: [id number] Date of Birth/Sex: Jul 04, 1945 (71 y.o. Female) Treating RN: Renne Crigler Primary Care Jaelynn Currier: [facility], [facility] Other Clinician: Referring Dewayne Jurek: Annita Brod Treating Jeily Guthridge/Extender: Kathreen Cosier in Treatment: 0 Vascular Assessment Pulses: Posterior Tibial Blood Pressure: Brachial: [Left:122] [Right:118] Dorsalis Pedis: 122 [Left:Dorsalis Pedis: 112] Ankle: Posterior Tibial: 112 [Left:Posterior Tibial: 130 1.00] [Right:1.07] Electronic Signature(s) Signed: 05/28/2017 1:58:36 PM By: Renne Crigler Entered By: Renne Crigler on 05/27/2017 14:15:17 Jessica [name] ([id number]) -------------------------------------------------------------------------------- Multi Wound Chart Details [name]. Date of Service: 05/27/2017 12:30 PM Medical Record Number: [id number] Patient Account Number: [id number] Date of Birth/Sex: 18-Feb-1946 (71 y.o. Female) Treating RN: Renne Crigler Primary Care Senay Sistrunk: [facility], [facility] Other Clinician: Referring Orley Lawry: Annita Brod Treating Lashawn Bromwell/Extender: Kathreen Cosier in Treatment: 0 Photos:  [1:No Photos] [2:No Photos] [3:No Photos] Wound Location: [1:Sacrum - Medial] [2:Left Lower Leg] [3:Right, Lateral Lower Leg] Wounding Event: [1:Pressure Injury] [2:Gradually Appeared] [3:Gradually Appeared] Primary Etiology: [1:Pressure Ulcer] [2:Pressure Ulcer] [3:Pressure Ulcer] Comorbid History: [1:Anemia, Lymphedema, Deep Anemia, Lymphedema, Deep Anemia, Lymphedema, Deep Vein Thrombosis, Quadriplegia Vein Thrombosis, Quadriplegia Vein Thrombosis, Quadriplegia] Date Acquired: [1:01/15/2017] [2:04/28/2017] [3:04/28/2017] Weeks of Treatment: [1:0] [2:0] [3:0] Wound Status: [1:Open] [2:Open] [3:Open] Measurements L x W x D [1:9.2x5.1x1] [2:1x0.6x0.2] [3:0.6x0.6x0.1] (cm) Area (cm) : [1:36.851] [2:0.471] [3:0.283] Volume (cm) : [1:36.851] [2:0.094] [3:0.028] Starting Position 1 [1:9] (o'clock): Ending Position 1 [1:2] (o'clock): Maximum Distance 1 (cm): [1:2.4] Undermining: [1:Yes] [2:No] [3:No] Classification: [1:Category/Stage IV] [2:Category/Stage III] [3:Category/Stage II] Exudate Amount: [1:Large] [2:Medium] [3:None Present] Exudate Type: [1:Serous] [2:Serous] [3:N/A] Exudate Color: [1:amber] [2:amber] [3:N/A] Wound Margin: [1:Distinct, outline attached] [2:Flat and Intact] [3:Flat and Intact] Granulation  Amount: [1:Large (67-100%)] [2:Large (67-100%)] [3:Large (67-100%)] Granulation Quality: [1:Red] [2:Red] [3:Red] Necrotic Amount: [1:Small (1-33%)] [2:Small (1-33%)] [3:Small (1-33%)] Necrotic Tissue: [1:Adherent Slough] [2:Eschar] [3:Eschar] Exposed Structures: [1:Fat Layer (Subcutaneous Tissue) Exposed: Yes Muscle: Yes Fascia: No Tendon: No Joint: No Bone: No] [2:Fascia: No Fat Layer (Subcutaneous Tissue) Exposed: No Tendon: No Muscle: No Joint: No Bone: No] [3:Fascia: No Fat Layer (Subcutaneous Tissue)  Exposed: No Tendon: No Muscle: No Joint: No Bone: No] Epithelialization: [1:None] [2:Large (67-100%)] [3:Large (67-100%)] Debridement: [1:Debridement (11042-11047)] [2:N/A]  [3:N/A] Pre-procedure [1:01:39] [2:N/A] [3:N/A] Verification/Time Out Taken: Pain Control: [1:Other] [2:N/A] [3:N/A] Tissue Debrided: [1:Fibrin/Slough, Skin, Subcutaneous] [2:N/A] [3:N/A] Level: [1:Skin/Subcutaneous Tissue] [2:N/A] [3:N/A] Debridement Area (sq cm): 15.75 [2:N/A] [3:N/A] Instrument: [1:Curette] [2:N/A] [3:N/A] Bleeding: Minimum N/A N/A Hemostasis Achieved: Pressure N/A N/A Procedural Pain: 0 N/A N/A Post Procedural Pain: 0 N/A N/A Debridement Treatment Procedure was tolerated well N/A N/A Response: Post Debridement 9.2x5.1x1.1 N/A N/A Measurements L x W x D (cm) Post Debridement Volume: 40.536 N/A N/A (cm) Post Debridement Stage: Category/Stage IV N/A N/A Periwound Skin Texture: Induration: Yes Excoriation: No No Abnormalities Noted Excoriation: No Induration: No Callus: No Callus: No Crepitus: No Crepitus: No Rash: No Rash: No Scarring: No Scarring: No Periwound Skin Moisture: Maceration: No Maceration: No No Abnormalities Noted Dry/Scaly: No Dry/Scaly: No Periwound Skin Color: Atrophie Blanche: No Atrophie Blanche: No No Abnormalities Noted Cyanosis: No Cyanosis: No Ecchymosis: No Ecchymosis: No Erythema: No Erythema: No Hemosiderin Staining: No Hemosiderin Staining: No Mottled: No Mottled: No Pallor: No Pallor: No Rubor: No Rubor: No Temperature: No Abnormality No Abnormality N/A Tenderness on Palpation: Yes No No Wound Preparation: Ulcer Cleansing: Ulcer Cleansing: Ulcer Cleansing: Rinsed/Irrigated with Saline Rinsed/Irrigated with Saline Rinsed/Irrigated with Saline Topical Anesthetic Applied: Topical Anesthetic Applied: Topical Anesthetic Applied: Other: lidocaINE 4% Other: LIDOCAINE 4% Other: LIDOCAINE 4% Procedures Performed: Debridement N/A N/A Treatment Notes Electronic Signature(s) Signed: 05/27/2017 2:53:44 PM By: Bonnell Public Entered By: Bonnell Public on 05/27/2017 14:53:44 Jessica [name]  ([id number]) -------------------------------------------------------------------------------- Pain Assessment Details [name]. Date of Service: 05/27/2017 12:30 PM Medical Record Number: [id number] Patient Account Number: [id number] Date of Birth/Sex: 1945-05-29 (71 y.o. Female) Treating RN: Renne Crigler Primary Care Lunden Stieber: [facility], [facility] Other Clinician: Referring Jaxtin Raimondo: Annita Brod Treating Evalyse Stroope/Extender: Kathreen Cosier in Treatment: 0 Active Problems Location of Pain Severity and Description of Pain Patient Has Paino No Site Locations Pain Management and Medication Current Pain Management: Electronic Signature(s) Signed: 05/27/2017 2:40:10 PM By: Renne Crigler Entered By: Renne Crigler on 05/27/2017 14:40:10 Jessica [name] ([id number]) -------------------------------------------------------------------------------- Wound Assessment Details [name]. Date of Service: 05/27/2017 12:30 PM Medical Record Number: [id number] Patient Account Number: [id number] Date of Birth/Sex: 09/04/1945 (71 y.o. Female) Treating RN: Renne Crigler Primary Care Jacky Dross: [facility], [facility] Other Clinician: Referring Mada Sadik: Annita Brod Treating Romin Divita/Extender: Kathreen Cosier in Treatment: 0 Wound Status Wound Number: 1 Primary Pressure Ulcer Etiology: Wound Location: Sacrum - Medial Wound Status: Open Wounding Event: Pressure Injury Comorbid Anemia, Lymphedema, Deep Vein Date Acquired: 01/15/2017 History: Thrombosis, Quadriplegia Weeks Of Treatment: 0 Clustered Wound: No Photos Photo Uploaded By: Renne Crigler on 05/28/2017 13:56:46 Wound Measurements Length: (cm) 9.2 Width: (cm) 5.1 Depth: (cm) 1 Area: (cm) 36.851 Volume: (cm) 36.851 % Reduction in Area: % Reduction in Volume: Epithelialization: None Tunneling: No Undermining: Yes Starting Position (o'clock): 9 Ending Position  (o'clock): 2 Maximum Distance: (cm) 2.4 Wound Description Classification: Category/Stage IV Wound Margin: Distinct, outline attached Exudate Amount: Large Exudate Type: Serous Exudate Color: amber  Foul Odor After Cleansing: No Slough/Fibrino Yes Wound Bed Granulation Amount: Large (67-100%) Exposed Structure Granulation Quality: Red Fascia Exposed: No Necrotic Amount: Small (1-33%) Fat Layer (Subcutaneous Tissue) Exposed: Yes Necrotic Quality: Adherent Slough Tendon Exposed: No Muscle Exposed: Yes Jessica [name], Jessica [name]. ([id number]) Necrosis of Muscle: No Joint Exposed: No Bone Exposed: No Periwound Skin Texture Texture Color No Abnormalities Noted: No No Abnormalities Noted: No Callus: No Atrophie Blanche: No Crepitus: No Cyanosis: No Excoriation: No Ecchymosis: No Induration: Yes Erythema: No Rash: No Hemosiderin Staining: No Scarring: No Mottled: No Pallor: No Moisture Rubor: No No Abnormalities Noted: No Dry / Scaly: No Temperature / Pain Maceration: No Temperature: No Abnormality Tenderness on Palpation: Yes Wound Preparation Ulcer Cleansing: Rinsed/Irrigated with Saline Topical Anesthetic Applied: Other: lidocaINE 4%, Electronic Signature(s) Signed: 05/28/2017 1:58:36 PM By: Renne Crigler Entered By: Renne Crigler on 05/27/2017 13:25:38 Jessica [name] ([id number]) -------------------------------------------------------------------------------- Wound Assessment Details [name]. Date of Service: 05/27/2017 12:30 PM Medical Record Number: [id number] Patient Account Number: [id number] Date of Birth/Sex: March 07, 1946 (71 y.o. Female) Treating RN: Renne Crigler Primary Care Marialuiza Car: [facility], [facility] Other Clinician: Referring Joycelin Radloff: Annita Brod Treating Joani Cosma/Extender: Kathreen Cosier in Treatment: 0 Wound Status Wound Number: 2 Primary Pressure Ulcer Etiology: Wound Location: Left Lower Leg Wound Status:  Open Wounding Event: Gradually Appeared Comorbid Anemia, Lymphedema, Deep Vein Date Acquired: 04/28/2017 History: Thrombosis, Quadriplegia Weeks Of Treatment: 0 Clustered Wound: No Photos Photo Uploaded By: Renne Crigler on 05/28/2017 13:57:02 Wound Measurements Length: (cm) 1 Width: (cm) 0.6 Depth: (cm) 0.2 Area: (cm) 0.471 Volume: (cm) 0.094 % Reduction in Area: % Reduction in Volume: Epithelialization: Large (67-100%) Tunneling: No Undermining: No Wound Description Classification: Category/Stage III Wound Margin: Flat and Intact Exudate Amount: Medium Exudate Type: Serous Exudate Color: amber Foul Odor After Cleansing: No Slough/Fibrino Yes Wound Bed Granulation Amount: Large (67-100%) Exposed Structure Granulation Quality: Red Fascia Exposed: No Necrotic Amount: Small (1-33%) Fat Layer (Subcutaneous Tissue) Exposed: No Necrotic Quality: Eschar Tendon Exposed: No Muscle Exposed: No Joint Exposed: No Bone Exposed: No Periwound Skin Texture Jessica [name], [name] ([id number]) Texture Color No Abnormalities Noted: No No Abnormalities Noted: No Callus: No Atrophie Blanche: No Crepitus: No Cyanosis: No Excoriation: No Ecchymosis: No Induration: No Erythema: No Rash: No Hemosiderin Staining: No Scarring: No Mottled: No Pallor: No Moisture Rubor: No No Abnormalities Noted: No Dry / Scaly: No Temperature / Pain Maceration: No Temperature: No Abnormality Wound Preparation Ulcer Cleansing: Rinsed/Irrigated with Saline Topical Anesthetic Applied: Other: LIDOCAINE 4%, Electronic Signature(s) Signed: 05/28/2017 1:58:36 PM By: Renne Crigler Entered By: Renne Crigler on 05/27/2017 13:27:45 Jessica [name] ([id number]) -------------------------------------------------------------------------------- Wound Assessment Details [name]. Date of Service: 05/27/2017 12:30 PM Medical Record Number: [id number] Patient Account  Number: [id number] Date of Birth/Sex: 08-Nov-1945 (71 y.o. Female) Treating RN: Renne Crigler Primary Care Dodger Sinning: [facility], [facility] Other Clinician: Referring Katelyne Galster: Annita Brod Treating Marjon Doxtater/Extender: Kathreen Cosier in Treatment: 0 Wound Status Wound Number: 3 Primary Pressure Ulcer Etiology: Wound Location: Right, Lateral Lower Leg Wound Status: Open Wounding Event: Gradually Appeared Comorbid Anemia, Lymphedema, Deep Vein Date Acquired: 04/28/2017 History: Thrombosis, Quadriplegia Weeks Of Treatment: 0 Clustered Wound: No Photos Photo Uploaded By: Renne Crigler on 05/28/2017 13:57:20 Wound Measurements Length: (cm) 0.6 Width: (cm) 0.6 Depth: (cm) 0.1 Area: (cm) 0.283 Volume: (cm) 0.028 % Reduction in Area: % Reduction in Volume: Epithelialization: Large (67-100%) Tunneling: No Undermining: No Wound Description Classification: Category/Stage II Wound Margin: Flat and Intact Exudate Amount: None Present Foul Odor After  Cleansing: No Slough/Fibrino Yes Wound Bed Granulation Amount: Large (67-100%) Exposed Structure Granulation Quality: Red Fascia Exposed: No Necrotic Amount: Small (1-33%) Fat Layer (Subcutaneous Tissue) Exposed: No Necrotic Quality: Eschar Tendon Exposed: No Muscle Exposed: No Joint Exposed: No Bone Exposed: No Periwound Skin Texture Texture Color No Abnormalities Noted: No No Abnormalities Noted: No Jessica BradyOVERMAN, [name] ([id number]) Moisture No Abnormalities Noted: No Wound Preparation Ulcer Cleansing: Rinsed/Irrigated with Saline Topical Anesthetic Applied: Other: LIDOCAINE 4%, Electronic Signature(s) Signed: 05/28/2017 1:58:36 PM By: Renne CriglerFlinchum, Cheryl Entered By: Renne CriglerFlinchum, Cheryl on 05/27/2017 14:04:38

## 2017-06-03 ENCOUNTER — Encounter: Payer: Medicare Other | Admitting: Nurse Practitioner

## 2017-06-03 ENCOUNTER — Ambulatory Visit
Admission: RE | Admit: 2017-06-03 | Discharge: 2017-06-03 | Disposition: A | Discharge status: Home / Self Care | Payer: Medicare Other | Source: Ambulatory Visit | Attending: Nurse Practitioner | Admitting: Nurse Practitioner

## 2017-06-03 DIAGNOSIS — L98429 Non-pressure chronic ulcer of back with unspecified severity: Secondary | ICD-10-CM

## 2017-06-03 DIAGNOSIS — L89153 Pressure ulcer of sacral region, stage 3: Secondary | ICD-10-CM | POA: Diagnosis not present

## 2017-06-05 NOTE — Progress Notes (Signed)
HENNESY, SOBALVARRO ([id number]) Visit Report for 06/03/2017 Chief Complaint Document Details [name], Jessica [name]. Date of Service: 06/03/2017 3:00 PM Medical Record Number: [id number] Patient Account Number: [id number] Date of Birth/Sex: [birth year] (71 y.o. Female) Treating RN: Ashok Cordia, Debi Primary Care Provider: Annita Brod Other Clinician: Referring Provider: CENTER, CHARLES Treating Provider/Extender: Kathreen Cosier in Treatment: 1 Information Obtained from: Patient Chief Complaint she is here for evaluation of a sacral ulcer and bilateral lower extremity ulcers Electronic Signature(s) Signed: 06/03/2017 4:39:58 PM By: Bonnell Public Entered By: Bonnell Public on 06/03/2017 16:39:58 Jessica [name] ([id number]) -------------------------------------------------------------------------------- Debridement Details [name]. Date of Service: 06/03/2017 3:00 PM Medical Record Number: [id number] Patient Account Number: [id number] Date of Birth/Sex: 04-Oct-1945 (71 y.o. Female) Treating RN: Ashok Cordia, Debi Primary Care Provider: Annita Brod Other Clinician: Referring Provider: CENTER, CHARLES Treating Provider/Extender: Kathreen Cosier in Treatment: 1 Debridement Performed for Wound #1 Medial Sacrum Assessment: Performed By: Physician Bonnell Public, NP Debridement: Debridement Pre-procedure Verification/Time Yes - 15:24 Out Taken: Start Time: 15:25 Pain Control: Other : lidocaine 4% Level: Skin/Subcutaneous Tissue Total Area Debrided (L x W): 9 (cm) x 4.6 (cm) = 41.4 (cm) Tissue and other material Viable, Non-Viable, Exudate, Fibrin/Slough, Subcutaneous debrided: Instrument: Curette Bleeding: Minimum Hemostasis Achieved: Pressure End Time: 15:27 Procedural Pain: 0 Post Procedural Pain: 0 Response to Treatment: Procedure was tolerated well Post Debridement Measurements of Total Wound Length: (cm) 9 Stage: Category/Stage  IV Width: (cm) 4.6 Depth: (cm) 2.4 Volume: (cm) 78.037 Character of Wound/Ulcer Post Requires Further Debridement Debridement: Post Procedure Diagnosis Same as Pre-procedure Electronic Signature(s) Signed: 06/03/2017 5:45:09 PM By: Bonnell Public Signed: 06/04/2017 8:00:35 AM By: Alejandro Mulling Entered By: Alejandro Mulling on 06/03/2017 15:27:09 Jessica [name] ([id number]) -------------------------------------------------------------------------------- HPI Details [name]. Date of Service: 06/03/2017 3:00 PM Medical Record Number: [id number] Patient Account Number: [id number] Date of Birth/Sex: 03-Jun-1945 (71 y.o. Female) Treating RN: Ashok Cordia, Debi Primary Care Provider: Annita Brod Other Clinician: Referring Provider: CENTER, CHARLES Treating Provider/Extender: Kathreen Cosier in Treatment: 1 History of Present Illness HPI Description: 05/27/17-she is here in initial evaluation for a left-sided sacral stage IV pressure ulcer and bilateral lower extremity, lateral aspect, unstageable pressure ulcers. She is accompanied by her husband and her son, who are her primary caregivers. She is bedbound secondary to spinal stenosis. According to her son and husband she was hospitalized from 10/5-11/1 with healthcare associated pneumonia and altered mental status. During her hospitalization she was intubated, extubated on 10/27. She was discharged with Foley catheter and follow up with urology. According to her son and spouse she developed the sacral ulcer during hospitalization. Home health has been applying Santyl daily. She does have a low air loss mattress and is repositioned every 2-3 hours per her family's report. According to the son and spouse she had an appointment with urology on 12/18 and during that appointment developed discoloration to her bilateral lower extremities which ultimately developed into unstageable pressure ulcers to the  lateral aspects of her bilateral lower extremities. There has been no topical treatment applied to these. She continues to have home health. There is no concerns expressed regarding dietary intake, stating she eats 3 meals a day, eating was provided; she is supplemented with boost with protein. 06/03/17-she is here in follow-up evaluation for sacral and bilateral lower extremity ulcers. Plain film x-ray done today reveals no distraction to the sacrum or coccyx, no visible abnormalities. Home health has ordered the negative pressure wound system but it  has not been initiated. We will continue with Hydrofera Blue until initiation of negative pressure wound system and continue with Santyl to bilateral lower extremity ulcers. Follow-up next week Electronic Signature(s) Signed: 06/03/2017 4:41:34 PM By: Bonnell Public Entered By: Bonnell Public on 06/03/2017 16:41:34 Jessica [name] ([id number]) -------------------------------------------------------------------------------- Physician Orders Details [name]. Date of Service: 06/03/2017 3:00 PM Medical Record Number: [id number] Patient Account Number: [id number] Date of Birth/Sex: January 17, 1946 (71 y.o. Female) Treating RN: Ashok Cordia, Debi Primary Care Provider: Annita Brod Other Clinician: Referring Provider: CENTER, CHARLES Treating Provider/Extender: Kathreen Cosier in Treatment: 1 Verbal / Phone Orders: Yes Clinician: Pinkerton, Debi Read Back and Verified: Yes Diagnosis Coding Wound Cleansing Wound #1 Medial Sacrum o Clean wound with Normal Saline. Anesthetic (add to Medication List) Wound #1 Medial Sacrum o Topical Lidocaine 4% cream applied to wound bed prior to debridement (In Clinic Only). Wound #2 Left Lower Leg o Topical Lidocaine 4% cream applied to wound bed prior to debridement (In Clinic Only). Wound #3 Right,Lateral Lower Leg o Topical Lidocaine 4% cream applied to wound bed prior to  debridement (In Clinic Only). Primary Wound Dressing Wound #1 Medial Sacrum o Hydrafera Blue - TO WOUND , ONLY PROVIDE ONE WEEK AT A TIME UNTIL APPROVAL OF WOUND VAC APPROVAL Wound #2 Left Lower Leg o Santyl Ointment - PLACE ON OPEN WOUND AREAS ON LATERAL LOWER LEGS Wound #3 Right,Lateral Lower Leg o Santyl Ointment - PLACE ON OPEN WOUND AREAS ON LATERAL LOWER LEGS Secondary Dressing Wound #1 Medial Sacrum o ABD pad - TAPE TO SECURE o Dry Gauze Wound #2 Left Lower Leg o Boardered Foam Dressing Wound #3 Right,Lateral Lower Leg o Boardered Foam Dressing Dressing Change Frequency Wound #1 Medial Sacrum o Change Dressing Monday, Wednesday, Friday Wound #2 Left Lower Leg o Change dressing every day. Wound #3 Right,Lateral Lower Leg Jessica [name], BRILLHART. ([id number]) o Change dressing every day. Follow-up Appointments o Return Appointment in 1 week. Off-Loading o Turn and reposition every 2 hours Additional Orders / Instructions o Increase protein intake. Home Health Wound #1 Medial Sacrum o Continue Home Health Visits o Home Health Nurse may visit PRN to address patientos wound care needs. o FACE TO FACE ENCOUNTER: MEDICARE and MEDICAID PATIENTS: I certify that this patient is under my care and that I had a face-to-face encounter that meets the physician face-to-face encounter requirements with this patient on this date. The encounter with the patient was in whole or in part for the following MEDICAL CONDITION: (primary reason for Home Healthcare) MEDICAL NECESSITY: I certify, that based on my findings, NURSING services are a medically necessary home health service. HOME BOUND STATUS: I certify that my clinical findings support that this patient is homebound (i.e., Due to illness or injury, pt requires aid of supportive devices such as crutches, cane, wheelchairs, walkers, the use of special transportation or the assistance of another person to leave  their place of residence. There is a normal inability to leave the home and doing so requires considerable and taxing effort. Other absences are for medical reasons / religious services and are infrequent or of short duration when for other reasons). o If current dressing causes regression in wound condition, may D/C ordered dressing product/s and apply Normal Saline Moist Dressing daily until next Wound Healing Center / Other MD appointment. Notify Wound Healing Center of regression in wound condition at [phone]. o Please direct any NON-WOUND related issues/requests for orders to patient's Primary Care Physician Wound #2 Left Lower  Leg o Continue Home Health Visits o Home Health Nurse may visit PRN to address patientos wound care needs. o FACE TO FACE ENCOUNTER: MEDICARE and MEDICAID PATIENTS: I certify that this patient is under my care and that I had a face-to-face encounter that meets the physician face-to-face encounter requirements with this patient on this date. The encounter with the patient was in whole or in part for the following MEDICAL CONDITION: (primary reason for Home Healthcare) MEDICAL NECESSITY: I certify, that based on my findings, NURSING services are a medically necessary home health service. HOME BOUND STATUS: I certify that my clinical findings support that this patient is homebound (i.e., Due to illness or injury, pt requires aid of supportive devices such as crutches, cane, wheelchairs, walkers, the use of special transportation or the assistance of another person to leave their place of residence. There is a normal inability to leave the home and doing so requires considerable and taxing effort. Other absences are for medical reasons / religious services and are infrequent or of short duration when for other reasons). o If current dressing causes regression in wound condition, may D/C ordered dressing product/s and apply Normal Saline Moist Dressing  daily until next Wound Healing Center / Other MD appointment. Notify Wound Healing Center of regression in wound condition at [phone]. o Please direct any NON-WOUND related issues/requests for orders to patient's Primary Care Physician Wound #3 Right,Lateral Lower Leg o Continue Home Health Visits o Home Health Nurse may visit PRN to address patientos wound care needs. o FACE TO FACE ENCOUNTER: MEDICARE and MEDICAID PATIENTS: I certify that this patient is under my care and that I had a face-to-face encounter that meets the physician face-to-face encounter requirements with this patient on this date. The encounter with the patient was in whole or in part for the following MEDICAL CONDITION: (primary reason for Home Healthcare) MEDICAL NECESSITY: I certify, that based on my findings, NURSING services are a medically necessary home health service. HOME BOUND STATUS: I certify that my clinical findings support that this patient is homebound (i.e., Due to illness or injury, pt requires aid of supportive devices such as crutches, cane, wheelchairs, walkers, the use of special transportation or the assistance of another person to leave their place of residence. There is a normal inability to leave the home Jessica [name], COBIN. ([id number]) and doing so requires considerable and taxing effort. Other absences are for medical reasons / religious services and are infrequent or of short duration when for other reasons). o If current dressing causes regression in wound condition, may D/C ordered dressing product/s and apply Normal Saline Moist Dressing daily until next Wound Healing Center / Other MD appointment. Notify Wound Healing Center of regression in wound condition at [phone]. o Please direct any NON-WOUND related issues/requests for orders to patient's Primary Care Physician Patient Medications Allergies: zinc, Ambien Notifications Medication Indication Start  End lidocaine DOSE 1 - topical 4 % cream - 1 cream topical Electronic Signature(s) Signed: 06/03/2017 5:45:09 PM By: Bonnell Public Entered By: Bonnell Public on 06/03/2017 16:42:05 Jessica [name] ([id number]) -------------------------------------------------------------------------------- Prescription 06/03/2017 [name] Provider: Bonnell Public NP Date of Birth: [birth year] NPI#: [id number] Sex: F DEA#: [id number] Phone #: [phone] License #: Patient Address: [facility] [address] [facility] [place], [place] [address] [address] [place], [place] [address] [phone] Allergies zinc Ambien Medication Medication: Route: Strength: Form: lidocaine 4 % topical cream topical 4% cream  Class: TOPICAL LOCAL ANESTHETICS Dose: Frequency / Time: Indication: 1 1 cream topical Number of Refills: Number of Units: 0 Generic Substitution: Start Date: End Date: One Time Use: Substitution Permitted No Note to Pharmacy: Signature(s): Date(s): Electronic Signature(s) Signed: 06/03/2017 5:45:09 PM By: Bonnell Public Entered By: Bonnell Public on 06/03/2017 16:42:06 Jessica [name] ([id number]) --------------------------------------------------------------------------------  Problem List Details [name]. Date of Service: 06/03/2017 3:00 PM Medical Record Number: [id number] Patient Account Number: [id number] Date of Birth/Sex: 05-Apr-1946 (71 y.o. Female) Treating RN: Ashok Cordia, Debi Primary Care Provider: Annita Brod Other Clinician: Referring Provider: CENTER, CHARLES Treating Provider/Extender: Bonnell Public Weeks in Treatment: 1 Active Problems ICD-10 Encounter Code Description Active Date Diagnosis L89.154 Pressure ulcer of sacral region, stage 4 05/27/2017 Yes M48.00 Spinal stenosis, site unspecified 05/27/2017 Yes R54 Age-related  physical debility 05/27/2017 Yes G89.4 Chronic pain syndrome 05/27/2017 Yes E78.2 Mixed hyperlipidemia 05/27/2017 Yes L89.95 Pressure ulcer of unspecified site, unstageable 05/27/2017 Yes Inactive Problems Resolved Problems Electronic Signature(s) Signed: 06/03/2017 4:38:43 PM By: Bonnell Public Entered By: Bonnell Public on 06/03/2017 16:38:42 Jessica [name] ([id number]) -------------------------------------------------------------------------------- Progress Note Details [name]. Date of Service: 06/03/2017 3:00 PM Medical Record Number: [id number] Patient Account Number: [id number] Date of Birth/Sex: 1945-11-22 (71 y.o. Female) Treating RN: Ashok Cordia, Debi Primary Care Provider: Annita Brod Other Clinician: Referring Provider: CENTER, CHARLES Treating Provider/Extender: Kathreen Cosier in Treatment: 1 Subjective Chief Complaint Information obtained from Patient she is here for evaluation of a sacral ulcer and bilateral lower extremity ulcers History of Present Illness (HPI) 05/27/17-she is here in initial evaluation for a left-sided sacral stage IV pressure ulcer and bilateral lower extremity, lateral aspect, unstageable pressure ulcers. She is accompanied by her husband and her son, who are her primary caregivers. She is bedbound secondary to spinal stenosis. According to her son and husband she was hospitalized from 10/5-11/1 with healthcare associated pneumonia and altered mental status. During her hospitalization she was intubated, extubated on 10/27. She was discharged with Foley catheter and follow up with urology. According to her son and spouse she developed the sacral ulcer during hospitalization. Home health has been applying Santyl daily. She does have a low air loss mattress and is repositioned every 2-3 hours per her family's report. According to the son and spouse she had an appointment with urology on 12/18 and during that appointment  developed discoloration to her bilateral lower extremities which ultimately developed into unstageable pressure ulcers to the lateral aspects of her bilateral lower extremities. There has been no topical treatment applied to these. She continues to have home health. There is no concerns expressed regarding dietary intake, stating she eats 3 meals a day, eating was provided; she is supplemented with boost with protein. 06/03/17-she is here in follow-up evaluation for sacral and bilateral lower extremity ulcers. Plain film x-ray done today reveals no distraction to the sacrum or coccyx, no visible abnormalities. Home health has ordered the negative pressure wound system but it has not been initiated. We will continue with Hydrofera Blue until initiation of negative pressure wound system and continue with Santyl to bilateral lower extremity ulcers. Follow-up next week Patient History Information obtained from Patient. Family History Hypertension - Father, Lung Disease - Father, No family history of Cancer, Diabetes, Heart Disease, Kidney Disease, Seizures, Stroke, Thyroid Problems, Tuberculosis. Social History Never smoker, Marital Status - Married, Alcohol Use - Never, Drug Use - No History, Caffeine Use - Daily. Medical History Hospitalization/Surgery History - 01/15/2017, [facility], UTI and yeats  infection with pneumonia. Jessica [name], Jessica [name] ([id number]) Objective Constitutional Vitals Time Taken: 2:59 PM, Temperature: 98.1 F, Pulse: 119 bpm, Respiratory Rate: 14 breaths/min, Blood Pressure: 130/67 mmHg. Integumentary (Hair, Skin) Wound #1 status is Open. Original cause of wound was Pressure Injury. The wound is located on the Medial Sacrum. The wound measures 9cm length x 4.6cm width x 2.3cm depth; 32.515cm^2 area and 74.786cm^3 volume. There is bone, muscle, and Fat Layer (Subcutaneous Tissue) Exposed exposed. There is no tunneling or undermining noted. There is a large amount of serous  drainage noted. The wound margin is distinct with the outline attached to the wound base. There is large (67-100%) red granulation within the wound bed. There is a small (1-33%) amount of necrotic tissue within the wound bed including Adherent Slough. The periwound skin appearance exhibited: Induration. The periwound skin appearance did not exhibit: Callus, Crepitus, Excoriation, Rash, Scarring, Dry/Scaly, Maceration, Atrophie Blanche, Cyanosis, Ecchymosis, Hemosiderin Staining, Mottled, Pallor, Rubor, Erythema. Periwound temperature was noted as No Abnormality. The periwound has tenderness on palpation. Wound #2 status is Open. Original cause of wound was Gradually Appeared. The wound is located on the Left Lower Leg. The wound measures 1cm length x 0.8cm width x 0.1cm depth; 0.628cm^2 area and 0.063cm^3 volume. There is no tunneling or undermining noted. There is a medium amount of serous drainage noted. The wound margin is flat and intact. There is medium (34-66%) red granulation within the wound bed. There is a medium (34-66%) amount of necrotic tissue within the wound bed including Eschar. The periwound skin appearance did not exhibit: Callus, Crepitus, Excoriation, Induration, Rash, Scarring, Dry/Scaly, Maceration, Atrophie Blanche, Cyanosis, Ecchymosis, Hemosiderin Staining, Mottled, Pallor, Rubor, Erythema. Periwound temperature was noted as No Abnormality. Wound #3 status is Open. Original cause of wound was Gradually Appeared. The wound is located on the Right,Lateral Lower Leg. The wound measures 0.6cm length x 0.6cm width x 0.1cm depth; 0.283cm^2 area and 0.028cm^3 volume. There is no tunneling or undermining noted. There is a none present amount of drainage noted. The wound margin is flat and intact. There is large (67-100%) red granulation within the wound bed. There is a small (1-33%) amount of necrotic tissue within the wound bed including Eschar. Assessment Active  Problems ICD-10 L89.154 - Pressure ulcer of sacral region, stage 4 M48.00 - Spinal stenosis, site unspecified R54 - Age-related physical debility G89.4 - Chronic pain syndrome E78.2 - Mixed hyperlipidemia L89.95 - Pressure ulcer of unspecified site, unstageable Procedures Wound #1 Pre-procedure diagnosis of Wound #1 is a Pressure Ulcer located on the Medial Sacrum . There was a Skin/Subcutaneous Tissue Debridement ([id number]) debridement with total area of 41.4 sq cm performed by Bonnell Public, NP. with the Jessica [name] ([id number]) following instrument(s): Curette to remove Viable and Non-Viable tissue/material including Exudate, Fibrin/Slough, and Subcutaneous after achieving pain control using Other (lidocaine 4%). A time out was conducted at 15:24, prior to the start of the procedure. A Minimum amount of bleeding was controlled with Pressure. The procedure was tolerated well with a pain level of 0 throughout and a pain level of 0 following the procedure. Post Debridement Measurements: 9cm length x 4.6cm width x 2.4cm depth; 78.037cm^3 volume. Post debridement Stage noted as Category/Stage IV. Character of Wound/Ulcer Post Debridement requires further debridement. Post procedure Diagnosis Wound #1: Same as Pre-Procedure Plan Wound Cleansing: Wound #1 Medial Sacrum: Clean wound with Normal Saline. Anesthetic (add to Medication List): Wound #1 Medial Sacrum: Topical Lidocaine 4% cream applied to wound bed prior to debridement (In  Clinic Only). Wound #2 Left Lower Leg: Topical Lidocaine 4% cream applied to wound bed prior to debridement (In Clinic Only). Wound #3 Right,Lateral Lower Leg: Topical Lidocaine 4% cream applied to wound bed prior to debridement (In Clinic Only). Primary Wound Dressing: Wound #1 Medial Sacrum: Hydrafera Blue - TO WOUND , ONLY PROVIDE ONE WEEK AT A TIME UNTIL APPROVAL OF WOUND VAC APPROVAL Wound #2 Left Lower Leg: Santyl Ointment - PLACE ON OPEN  WOUND AREAS ON LATERAL LOWER LEGS Wound #3 Right,Lateral Lower Leg: Santyl Ointment - PLACE ON OPEN WOUND AREAS ON LATERAL LOWER LEGS Secondary Dressing: Wound #1 Medial Sacrum: ABD pad - TAPE TO SECURE Dry Gauze Wound #2 Left Lower Leg: Boardered Foam Dressing Wound #3 Right,Lateral Lower Leg: Boardered Foam Dressing Dressing Change Frequency: Wound #1 Medial Sacrum: Change Dressing Monday, Wednesday, Friday Wound #2 Left Lower Leg: Change dressing every day. Wound #3 Right,Lateral Lower Leg: Change dressing every day. Follow-up Appointments: Return Appointment in 1 week. Off-Loading: Turn and reposition every 2 hours Additional Orders / Instructions: Increase protein intake. Home Health: Wound #1 Medial Sacrum: Continue Home Health Visits Home Health Nurse may visit PRN to address patient s wound care needs. FACE TO FACE ENCOUNTER: MEDICARE and MEDICAID PATIENTS: I certify that this patient is under my care and that I had a face-to-face encounter that meets the physician face-to-face encounter requirements with this patient on this date. The Jessica [name], [name] ([id number]) encounter with the patient was in whole or in part for the following MEDICAL CONDITION: (primary reason for Home Healthcare) MEDICAL NECESSITY: I certify, that based on my findings, NURSING services are a medically necessary home health service. HOME BOUND STATUS: I certify that my clinical findings support that this patient is homebound (i.e., Due to illness or injury, pt requires aid of supportive devices such as crutches, cane, wheelchairs, walkers, the use of special transportation or the assistance of another person to leave their place of residence. There is a normal inability to leave the home and doing so requires considerable and taxing effort. Other absences are for medical reasons / religious services and are infrequent or of short duration when for other reasons). If current dressing causes  regression in wound condition, may D/C ordered dressing product/s and apply Normal Saline Moist Dressing daily until next Wound Healing Center / Other MD appointment. Notify Wound Healing Center of regression in wound condition at [phone]. Please direct any NON-WOUND related issues/requests for orders to patient's Primary Care Physician Wound #2 Left Lower Leg: Continue Home Health Visits Home Health Nurse may visit PRN to address patient s wound care needs. FACE TO FACE ENCOUNTER: MEDICARE and MEDICAID PATIENTS: I certify that this patient is under my care and that I had a face-to-face encounter that meets the physician face-to-face encounter requirements with this patient on this date. The encounter with the patient was in whole or in part for the following MEDICAL CONDITION: (primary reason for Home Healthcare) MEDICAL NECESSITY: I certify, that based on my findings, NURSING services are a medically necessary home health service. HOME BOUND STATUS: I certify that my clinical findings support that this patient is homebound (i.e., Due to illness or injury, pt requires aid of supportive devices such as crutches, cane, wheelchairs, walkers, the use of special transportation or the assistance of another person to leave their place of residence. There is a normal inability to leave the home and doing so requires considerable and taxing effort. Other absences are for medical reasons / religious services  and are infrequent or of short duration when for other reasons). If current dressing causes regression in wound condition, may D/C ordered dressing product/s and apply Normal Saline Moist Dressing daily until next Wound Healing Center / Other MD appointment. Notify Wound Healing Center of regression in wound condition at [phone]. Please direct any NON-WOUND related issues/requests for orders to patient's Primary Care Physician Wound #3 Right,Lateral Lower Leg: Continue Home Health  Visits Home Health Nurse may visit PRN to address patient s wound care needs. FACE TO FACE ENCOUNTER: MEDICARE and MEDICAID PATIENTS: I certify that this patient is under my care and that I had a face-to-face encounter that meets the physician face-to-face encounter requirements with this patient on this date. The encounter with the patient was in whole or in part for the following MEDICAL CONDITION: (primary reason for Home Healthcare) MEDICAL NECESSITY: I certify, that based on my findings, NURSING services are a medically necessary home health service. HOME BOUND STATUS: I certify that my clinical findings support that this patient is homebound (i.e., Due to illness or injury, pt requires aid of supportive devices such as crutches, cane, wheelchairs, walkers, the use of special transportation or the assistance of another person to leave their place of residence. There is a normal inability to leave the home and doing so requires considerable and taxing effort. Other absences are for medical reasons / religious services and are infrequent or of short duration when for other reasons). If current dressing causes regression in wound condition, may D/C ordered dressing product/s and apply Normal Saline Moist Dressing daily until next Wound Healing Center / Other MD appointment. Notify Wound Healing Center of regression in wound condition at [phone]. Please direct any NON-WOUND related issues/requests for orders to patient's Primary Care Physician The following medication(s) was prescribed: lidocaine topical 4 % cream 1 1 cream topical was prescribed at facility 1. hydrofera blue to sacrum; santyl to ble 2. continue with offloading 3. follow up next week 4. NPWT T, TH, Sat when available/approved Jessica [name], Jessica [name] ([id number]) Electronic Signature(s) Signed: 06/03/2017 4:42:45 PM By: Bonnell Public Entered By: Bonnell Public on 06/03/2017 16:42:45 Jessica [name]  ([id number]) -------------------------------------------------------------------------------- ROS/PFSH Details [name]. Date of Service: 06/03/2017 3:00 PM Medical Record Number: [id number] Patient Account Number: [id number] Date of Birth/Sex: 1945-08-27 (71 y.o. Female) Treating RN: Ashok Cordia, Debi Primary Care Provider: Annita Brod Other Clinician: Referring Provider: CENTER, CHARLES Treating Provider/Extender: Kathreen Cosier in Treatment: 1 Information Obtained From Patient Wound History Do you currently have one or more open woundso Yes How many open wounds do you currently haveo 3 How have you been treating your wound(s) until nowo 3 months Has your wound(s) ever healed and then re-openedo No Have you had any lab work done in the past montho No Have you tested positive for an antibiotic resistant organism (MRSA, VRE)o No Eyes Medical History: Negative for: Cataracts; Glaucoma; Optic Neuritis Hematologic/Lymphatic Medical History: Positive for: Anemia; Lymphedema Negative for: Hemophilia; Human Immunodeficiency Virus; Sickle Cell Disease Respiratory Medical History: Negative for: Aspiration; Asthma; Chronic Obstructive Pulmonary Disease (COPD); Pneumothorax; Sleep Apnea; Tuberculosis Cardiovascular Medical History: Positive for: Deep Vein Thrombosis Negative for: Angina; Arrhythmia; Congestive Heart Failure; Coronary Artery Disease; Hypertension; Hypotension; Myocardial Infarction Gastrointestinal Medical History: Negative for: Cirrhosis ; Colitis; Crohnos; Hepatitis A; Hepatitis B; Hepatitis C Endocrine Medical History: Negative for: Type I Diabetes; Type II Diabetes Genitourinary Medical History: Negative for: End Stage Renal Disease Jessica [name], Jessica [name] ([id number]) Immunological Medical History: Negative for: Lupus Erythematosus;  Raynaudos; Scleroderma Integumentary (Skin) Medical History: Negative for: History of Burn; History of  pressure wounds Musculoskeletal Medical History: Negative for: Gout; Rheumatoid Arthritis; Osteoarthritis; Osteomyelitis Neurologic Medical History: Positive for: Quadriplegia Negative for: Dementia; Neuropathy; Paraplegia; Seizure Disorder Psychiatric Medical History: Negative for: Anorexia/bulimia; Confinement Anxiety Immunizations Pneumococcal Vaccine: Received Pneumococcal Vaccination: Yes Implantable Devices Hospitalization / Surgery History Name of Hospital Purpose of Hospitalization/Surgery Date [facility] UTI and yeats infection with pneumonia 01/15/2017 Family and Social History Cancer: No; Diabetes: No; Heart Disease: No; Hypertension: Yes - Father; Kidney Disease: No; Lung Disease: Yes - Father; Seizures: No; Stroke: No; Thyroid Problems: No; Tuberculosis: No; Never smoker; Marital Status - Married; Alcohol Use: Never; Drug Use: No History; Caffeine Use: Daily; Financial Concerns: No; Food, Clothing or Shelter Needs: No; Support System Lacking: No; Advanced Directives: No; Patient does not want information on Advanced Directives; Do not resuscitate: No; Living Will: No; Medical Power of Attorney: No Electronic Signature(s) Signed: 06/03/2017 5:45:09 PM By: Bonnell Public Signed: 06/04/2017 8:00:35 AM By: Alejandro Mulling Entered By: Bonnell Public on 06/03/2017 16:41:42 Jessica [name] ([id number]) -------------------------------------------------------------------------------- SuperBill Details [name]. Date of Service: 06/03/2017 Medical Record Number: [id number] Patient Account Number: [id number] Date of Birth/Sex: 12-27-1945 (72 y.o. Female) Treating RN: Ashok Cordia, Debi Primary Care Provider: Annita Brod Other Clinician: Referring Provider: CENTER, CHARLES Treating Provider/Extender: Kathreen Cosier in Treatment: 1 Diagnosis Coding ICD-10 Codes Code Description L89.154 Pressure ulcer of sacral region, stage 4 M48.00 Spinal stenosis, site  unspecified R54 Age-related physical debility G89.4 Chronic pain syndrome E78.2 Mixed hyperlipidemia L89.95 Pressure ulcer of unspecified site, unstageable Facility Procedures CPT4 Code: [id number] Description: 11042 - DEB SUBQ TISSUE 20 SQ CM/< ICD-10 Diagnosis Description L89.154 Pressure ulcer of sacral region, stage 4 M48.00 Spinal stenosis, site unspecified Modifier: Quantity: 1 CPT4 Code: [id number] Description: 11045 - DEB SUBQ TISS EA ADDL 20CM ICD-10 Diagnosis Description L89.154 Pressure ulcer of sacral region, stage 4 M48.00 Spinal stenosis, site unspecified Modifier: Quantity: 2 Physician Procedures CPT4 Code: [id number] Description: 11042 - WC PHYS SUBQ TISS 20 SQ CM ICD-10 Diagnosis Description L89.154 Pressure ulcer of sacral region, stage 4 M48.00 Spinal stenosis, site unspecified Modifier: Quantity: 1 CPT4 Code: [id number] Description: 11045 - WC PHYS SUBQ TISS EA ADDL 20 CM ICD-10 Diagnosis Description L89.154 Pressure ulcer of sacral region, stage 4 M48.00 Spinal stenosis, site unspecified Modifier: Quantity: 2 Electronic Signature(s) Signed: 06/03/2017 4:43:06 PM By: Bonnell Public Entered By: Bonnell Public on 06/03/2017 16:43:05

## 2017-06-06 NOTE — Progress Notes (Signed)
AMORITA, VANROSSUM ([id number]) Visit Report for 06/03/2017 Arrival Information Details [name], Jessica [name]. Date of Service: 06/03/2017 3:00 PM Medical Record Number: [id number] Patient Account Number: [id number] Date of Birth/Sex: 1945/09/25 (71 y.o. Female) Treating RN: Curtis Sites Primary Care Ravon Mcilhenny: Annita Brod Other Clinician: Referring Keiran Gaffey: CENTER, CHARLES Treating Dashanti Burr/Extender: Kathreen Cosier in Treatment: 1 Visit Information History Since Last Visit Added or deleted any medications: No Patient Arrived: Stretcher Any new allergies or adverse reactions: No Arrival Time: 14:56 Had a fall or experienced change in No Accompanied By: spouse activities of daily living that may affect Transfer Assistance: Stretcher risk of falls: Patient Identification Verified: Yes Signs or symptoms of abuse/neglect since last visito No Secondary Verification Process Completed: Yes Hospitalized since last visit: No Patient Has Alerts: Yes Has Dressing in Place as Prescribed: Yes Patient Alerts: allergic to zinc Pain Present Now: No Electronic Signature(s) Signed: 06/03/2017 5:33:45 PM By: Curtis Sites Entered By: Curtis Sites on 06/03/2017 14:56:33 Jessica [name] ([id number]) -------------------------------------------------------------------------------- Encounter Discharge Information Details [name]. Date of Service: 06/03/2017 3:00 PM Medical Record Number: [id number] Patient Account Number: [id number] Date of Birth/Sex: [birth year] (71 y.o. Female) Treating RN: Renne Crigler Primary Care Deserai Cansler: Annita Brod Other Clinician: Referring Omero Kowal: CENTER, CHARLES Treating Dierdre Mccalip/Extender: Kathreen Cosier in Treatment: 1 Encounter Discharge Information Items Discharge Pain Level: 0 Discharge Condition: Stable Ambulatory Status: Stretcher Discharge Destination: Home Transportation: Ambulance son  and Accompanied By: husband Schedule Follow-up Appointment: Yes Medication Reconciliation completed and No provided to Patient/Care Kayela Humphres: Provided on Clinical Summary of Care: 06/03/2017 Form Type Recipient Paper Patient PO Electronic Signature(s) Signed: 06/04/2017 8:16:02 AM By: Gwenlyn Perking Entered By: Gwenlyn Perking on 06/03/2017 15:55:00 Jessica [name] ([id number]) -------------------------------------------------------------------------------- Lower Extremity Assessment Details [name]. Date of Service: 06/03/2017 3:00 PM Medical Record Number: [id number] Patient Account Number: [id number] Date of Birth/Sex: 1946-03-09 (71 y.o. Female) Treating RN: Curtis Sites Primary Care Kelvyn Schunk: Annita Brod Other Clinician: Referring Kashia Brossard: CENTER, CHARLES Treating Tiffannie Sloss/Extender: Kathreen Cosier in Treatment: 1 Vascular Assessment Pulses: Dorsalis Pedis Palpable: [Left:Yes] [Right:Yes] Posterior Tibial Extremity colors, hair growth, and conditions: Extremity Color: [Left:Normal] [Right:Normal] Hair Growth on Extremity: [Left:No] [Right:No] Temperature of Extremity: [Left:Cool] [Right:Cool] Capillary Refill: [Left:< 3 seconds] [Right:< 3 seconds] Electronic Signature(s) Signed: 06/03/2017 5:33:45 PM By: Curtis Sites Entered By: Curtis Sites on 06/03/2017 15:16:38 Jessica [name] ([id number]) -------------------------------------------------------------------------------- Multi Wound Chart Details [name]. Date of Service: 06/03/2017 3:00 PM Medical Record Number: [id number] Patient Account Number: [id number] Date of Birth/Sex: February 19, 1946 (71 y.o. Female) Treating RN: Ashok Cordia, Debi Primary Care Dev Dhondt: Annita Brod Other Clinician: Referring Sharronda Schweers: CENTER, CHARLES Treating Arben Packman/Extender: Kathreen Cosier in Treatment: 1 Vital Signs Height(in): Pulse(bpm): 119 Weight(lbs): Blood  Pressure(mmHg): 130/67 Body Mass Index(BMI): Temperature(F): 98.1 Respiratory Rate 14 (breaths/min): Photos: [1:No Photos] [2:No Photos] [3:No Photos] Wound Location: [1:Sacrum - Medial] [2:Left Lower Leg] [3:Right Lower Leg - Lateral] Wounding Event: [1:Pressure Injury] [2:Gradually Appeared] [3:Gradually Appeared] Primary Etiology: [1:Pressure Ulcer] [2:Pressure Ulcer] [3:Pressure Ulcer] Comorbid History: [1:Anemia, Lymphedema, Deep Anemia, Lymphedema, Deep Anemia, Lymphedema, Deep Vein Thrombosis, Quadriplegia Vein Thrombosis, Quadriplegia Vein Thrombosis, Quadriplegia] Date Acquired: [1:01/15/2017] [2:04/28/2017] [3:04/28/2017] Weeks of Treatment: [1:1] [2:1] [3:1] Wound Status: [1:Open] [2:Open] [3:Open] Measurements L x W x D [1:9x4.6x2.3] [2:1x0.8x0.1] [3:0.6x0.6x0.1] (cm) Area (cm) : [1:32.515] [2:0.628] [3:0.283] Volume (cm) : [1:74.786] [2:0.063] [3:0.028] % Reduction in Area: [1:11.80%] [2:-33.30%] [3:0.00%] % Reduction in Volume: [1:-102.90%] [2:33.00%] [3:0.00%] Classification: [1:Category/Stage IV] [2:Category/Stage III] [3:Category/Stage II] Exudate Amount: [  1:Large] [2:Medium] [3:None Present] Exudate Type: [1:Serous] [2:Serous] [3:N/A] Exudate Color: [1:amber] [2:amber] [3:N/A] Wound Margin: [1:Distinct, outline attached] [2:Flat and Intact] [3:Flat and Intact] Granulation Amount: [1:Large (67-100%)] [2:Medium (34-66%)] [3:Large (67-100%)] Granulation Quality: [1:Red] [2:Red] [3:Red] Necrotic Amount: [1:Small (1-33%)] [2:Medium (34-66%)] [3:Small (1-33%)] Necrotic Tissue: [1:Adherent Slough] [2:Eschar] [3:Eschar] Exposed Structures: [1:Fat Layer (Subcutaneous Tissue) Exposed: Yes Muscle: Yes Bone: Yes Fascia: No Tendon: No Joint: No] [2:Fascia: No Fat Layer (Subcutaneous Tissue) Exposed: No Tendon: No Muscle: No Joint: No Bone: No] [3:Fascia: No Fat Layer (Subcutaneous Tissue)  Exposed: No Tendon: No Muscle: No Joint: No Bone: No] Epithelialization: [1:None]  [2:Large (67-100%)] [3:Large (67-100%)] Debridement: [1:Debridement (11042-11047)] [2:N/A] [3:N/A] Pre-procedure [1:15:24] [2:N/A] [3:N/A] Verification/Time Out Taken: Pain Control: [1:Other] [2:N/A] [3:N/A] Tissue Debrided: [2:N/A] [3:N/A] Fibrin/Slough, Exudates, Subcutaneous Level: Skin/Subcutaneous Tissue N/A N/A Debridement Area (sq cm): 41.4 N/A N/A Instrument: Curette N/A N/A Bleeding: Minimum N/A N/A Hemostasis Achieved: Pressure N/A N/A Procedural Pain: 0 N/A N/A Post Procedural Pain: 0 N/A N/A Debridement Treatment Procedure was tolerated well N/A N/A Response: Post Debridement 9x4.6x2.4 N/A N/A Measurements L x W x D (cm) Post Debridement Volume: 78.037 N/A N/A (cm) Post Debridement Stage: Category/Stage IV N/A N/A Periwound Skin Texture: Induration: Yes Excoriation: No No Abnormalities Noted Excoriation: No Induration: No Callus: No Callus: No Crepitus: No Crepitus: No Rash: No Rash: No Scarring: No Scarring: No Periwound Skin Moisture: Maceration: No Maceration: No No Abnormalities Noted Dry/Scaly: No Dry/Scaly: No Periwound Skin Color: Atrophie Blanche: No Atrophie Blanche: No No Abnormalities Noted Cyanosis: No Cyanosis: No Ecchymosis: No Ecchymosis: No Erythema: No Erythema: No Hemosiderin Staining: No Hemosiderin Staining: No Mottled: No Mottled: No Pallor: No Pallor: No Rubor: No Rubor: No Temperature: No Abnormality No Abnormality N/A Tenderness on Palpation: Yes No No Wound Preparation: Ulcer Cleansing: Ulcer Cleansing: Ulcer Cleansing: Rinsed/Irrigated with Saline Rinsed/Irrigated with Saline Rinsed/Irrigated with Saline Topical Anesthetic Applied: Topical Anesthetic Applied: Topical Anesthetic Applied: Other: lidocaINE 4% Other: LIDOCAINE 4% Other: LIDOCAINE 4% Procedures Performed: Debridement N/A N/A Treatment Notes Wound #1 (Medial Sacrum) 1. Cleansed with: Clean wound with Normal Saline 2. Anesthetic Topical  Lidocaine 4% cream to wound bed prior to debridement 4. Dressing Applied: Hydrafera Blue 5. Secondary Dressing Applied ABD Pad 7. Secured with Tape Wound #2 (Left Lower Leg) SISSY, GOETZKE. ([id number]) 1. Cleansed with: Clean wound with Normal Saline 2. Anesthetic Topical Lidocaine 4% cream to wound bed prior to debridement 4. Dressing Applied: Santyl Ointment 5. Secondary Dressing Applied ABD Pad Notes kerlix wrap Wound #3 (Right, Lateral Lower Leg) 1. Cleansed with: Clean wound with Normal Saline 2. Anesthetic Topical Lidocaine 4% cream to wound bed prior to debridement 4. Dressing Applied: Santyl Ointment 5. Secondary Dressing Applied ABD Pad Notes kerlix wrap Electronic Signature(s) Signed: 06/03/2017 4:38:50 PM By: Bonnell Public Entered By: Bonnell Public on 06/03/2017 16:38:50 Jessica [name] ([id number]) -------------------------------------------------------------------------------- Multi-Disciplinary Care Plan Details [name]. Date of Service: 06/03/2017 3:00 PM Medical Record Number: [id number] Patient Account Number: [id number] Date of Birth/Sex: 1945/09/24 (71 y.o. Female) Treating RN: Ashok Cordia, Debi Primary Care Demeka Sutter: Annita Brod Other Clinician: Referring Mariesa Grieder: CENTER, CHARLES Treating Milany Geck/Extender: Kathreen Cosier in Treatment: 1 Active Inactive ` Orientation to the Wound Care Program Nursing Diagnoses: Knowledge deficit related to the wound healing center program Goals: Patient/caregiver will verbalize understanding of the Wound Healing Center Program Date Initiated: 06/03/2017 Target Resolution Date: 06/17/2017 Goal Status: Active Interventions: Provide education on orientation to the wound center Notes: ` Pressure Nursing Diagnoses: Knowledge deficit related  to causes and risk factors for pressure ulcer development Knowledge deficit related to management of pressures ulcers Potential for  impaired tissue integrity related to pressure, friction, moisture, and shear Goals: Patient will remain free from development of additional pressure ulcers Date Initiated: 06/03/2017 Target Resolution Date: 06/17/2017 Goal Status: Active Patient will remain free of pressure ulcers Date Initiated: 06/03/2017 Target Resolution Date: 06/17/2017 Goal Status: Active Patient/caregiver will verbalize risk factors for pressure ulcer development Date Initiated: 06/03/2017 Target Resolution Date: 06/17/2017 Goal Status: Active Interventions: Assess: immobility, friction, shearing, incontinence upon admission and as needed Assess potential for pressure ulcer upon admission and as needed Notes: ` Wound/Skin Impairment SOLOMIA, HARRELL ([id number]) Nursing Diagnoses: Impaired tissue integrity Goals: Patient/caregiver will verbalize understanding of skin care regimen Date Initiated: 06/03/2017 Target Resolution Date: 06/17/2017 Goal Status: Active Ulcer/skin breakdown will have a volume reduction of 30% by week 4 Date Initiated: 06/03/2017 Target Resolution Date: 06/17/2017 Goal Status: Active Interventions: Assess ulceration(s) every visit Treatment Activities: Patient referred to home care : 06/03/2017 Skin care regimen initiated : 06/03/2017 Notes: Electronic Signature(s) Signed: 06/04/2017 8:00:35 AM By: Alejandro Mulling Entered By: Alejandro Mulling on 06/03/2017 15:24:58 Jessica [name] ([id number]) -------------------------------------------------------------------------------- Pain Assessment Details [name]. Date of Service: 06/03/2017 3:00 PM Medical Record Number: [id number] Patient Account Number: [id number] Date of Birth/Sex: 21-Feb-1946 (71 y.o. Female) Treating RN: Curtis Sites Primary Care Treasure Ochs: Annita Brod Other Clinician: Referring Laurieanne Galloway: CENTER, CHARLES Treating Aarini Slee/Extender: Kathreen Cosier in Treatment: 1 Active  Problems Location of Pain Severity and Description of Pain Patient Has Paino No Site Locations Pain Management and Medication Current Pain Management: Electronic Signature(s) Signed: 06/03/2017 5:33:45 PM By: Curtis Sites Entered By: Curtis Sites on 06/03/2017 14:59:12 Jessica [name] ([id number]) -------------------------------------------------------------------------------- Patient/Caregiver Education Details [name]. Date of Service: 06/03/2017 3:00 PM Medical Record Number: [id number] Patient Account Number: [id number] Date of Birth/Gender: Oct 09, 1945 (71 y.o. Female) Treating RN: Renne Crigler Primary Care Physician: Annita Brod Other Clinician: Referring Physician: CENTER, CHARLES Treating Physician/Extender: Kathreen Cosier in Treatment: 1 Education Assessment Education Provided To: Patient Education Topics Provided Wound Debridement: Handouts: Wound Debridement Methods: Explain/Verbal Responses: State content correctly Wound/Skin Impairment: Handouts: Caring for Your Ulcer Methods: Explain/Verbal Responses: State content correctly Electronic Signature(s) Signed: 06/04/2017 4:58:29 PM By: Renne Crigler Entered By: Renne Crigler on 06/03/2017 15:52:20 Jessica [name] ([id number]) -------------------------------------------------------------------------------- Wound Assessment Details [name]. Date of Service: 06/03/2017 3:00 PM Medical Record Number: [id number] Patient Account Number: [id number] Date of Birth/Sex: Feb 21, 1946 (71 y.o. Female) Treating RN: Curtis Sites Primary Care Sheneika Walstad: Annita Brod Other Clinician: Referring Edvin Albus: CENTER, CHARLES Treating Akacia Boltz/Extender: Bonnell Public Weeks in Treatment: 1 Wound Status Wound Number: 1 Primary Pressure Ulcer Etiology: Wound Location: Sacrum - Medial Wound Status: Open Wounding Event: Pressure Injury Comorbid Anemia,  Lymphedema, Deep Vein Date Acquired: 01/15/2017 History: Thrombosis, Quadriplegia Weeks Of Treatment: 1 Clustered Wound: No Photos Photo Uploaded By: Curtis Sites on 06/04/2017 11:14:41 Wound Measurements Length: (cm) 9 Width: (cm) 4.6 Depth: (cm) 2.3 Area: (cm) 32.515 Volume: (cm) 74.786 % Reduction in Area: 11.8% % Reduction in Volume: -102.9% Epithelialization: None Tunneling: No Undermining: No Wound Description Classification: Category/Stage IV Wound Margin: Distinct, outline attached Exudate Amount: Large Exudate Type: Serous Exudate Color: amber Foul Odor After Cleansing: No Slough/Fibrino Yes Wound Bed Granulation Amount: Large (67-100%) Exposed Structure Granulation Quality: Red Fascia Exposed: No Necrotic Amount: Small (1-33%) Fat Layer (Subcutaneous Tissue) Exposed: Yes Necrotic Quality: Adherent Slough Tendon Exposed: No Muscle Exposed:  Yes Necrosis of Muscle: No Joint Exposed: No Bone Exposed: Yes Periwound Skin Texture KESHA, HURRELL. ([id number]) Texture Color No Abnormalities Noted: No No Abnormalities Noted: No Callus: No Atrophie Blanche: No Crepitus: No Cyanosis: No Excoriation: No Ecchymosis: No Induration: Yes Erythema: No Rash: No Hemosiderin Staining: No Scarring: No Mottled: No Pallor: No Moisture Rubor: No No Abnormalities Noted: No Dry / Scaly: No Temperature / Pain Maceration: No Temperature: No Abnormality Tenderness on Palpation: Yes Wound Preparation Ulcer Cleansing: Rinsed/Irrigated with Saline Topical Anesthetic Applied: Other: lidocaINE 4%, Treatment Notes Wound #1 (Medial Sacrum) 1. Cleansed with: Clean wound with Normal Saline 2. Anesthetic Topical Lidocaine 4% cream to wound bed prior to debridement 4. Dressing Applied: Hydrafera Blue 5. Secondary Dressing Applied ABD Pad 7. Secured with Secretary/administrator) Signed: 06/03/2017 5:33:45 PM By: Curtis Sites Entered By: Curtis Sites on  06/03/2017 15:16:15 Jessica [name] ([id number]) -------------------------------------------------------------------------------- Wound Assessment Details [name]. Date of Service: 06/03/2017 3:00 PM Medical Record Number: [id number] Patient Account Number: [id number] Date of Birth/Sex: 08/24/1945 (71 y.o. Female) Treating RN: Curtis Sites Primary Care Kaedyn Polivka: Annita Brod Other Clinician: Referring Georgios Kina: CENTER, CHARLES Treating Kendyl Bissonnette/Extender: Bonnell Public Weeks in Treatment: 1 Wound Status Wound Number: 2 Primary Pressure Ulcer Etiology: Wound Location: Left Lower Leg Wound Status: Open Wounding Event: Gradually Appeared Comorbid Anemia, Lymphedema, Deep Vein Date Acquired: 04/28/2017 History: Thrombosis, Quadriplegia Weeks Of Treatment: 1 Clustered Wound: No Photos Photo Uploaded By: Curtis Sites on 06/04/2017 11:15:18 Wound Measurements Length: (cm) 1 Width: (cm) 0.8 Depth: (cm) 0.1 Area: (cm) 0.628 Volume: (cm) 0.063 % Reduction in Area: -33.3% % Reduction in Volume: 33% Epithelialization: Large (67-100%) Tunneling: No Undermining: No Wound Description Classification: Category/Stage III Wound Margin: Flat and Intact Exudate Amount: Medium Exudate Type: Serous Exudate Color: amber Foul Odor After Cleansing: No Slough/Fibrino Yes Wound Bed Granulation Amount: Medium (34-66%) Exposed Structure Granulation Quality: Red Fascia Exposed: No Necrotic Amount: Medium (34-66%) Fat Layer (Subcutaneous Tissue) Exposed: No Necrotic Quality: Eschar Tendon Exposed: No Muscle Exposed: No Joint Exposed: No Bone Exposed: No Periwound Skin Texture Talbot, Yevonne W. ([id number]) Texture Color No Abnormalities Noted: No No Abnormalities Noted: No Callus: No Atrophie Blanche: No Crepitus: No Cyanosis: No Excoriation: No Ecchymosis: No Induration: No Erythema: No Rash: No Hemosiderin Staining: No Scarring: No Mottled:  No Pallor: No Moisture Rubor: No No Abnormalities Noted: No Dry / Scaly: No Temperature / Pain Maceration: No Temperature: No Abnormality Wound Preparation Ulcer Cleansing: Rinsed/Irrigated with Saline Topical Anesthetic Applied: Other: LIDOCAINE 4%, Treatment Notes Wound #2 (Left Lower Leg) 1. Cleansed with: Clean wound with Normal Saline 2. Anesthetic Topical Lidocaine 4% cream to wound bed prior to debridement 4. Dressing Applied: Santyl Ointment 5. Secondary Dressing Applied ABD Pad Notes kerlix wrap Electronic Signature(s) Signed: 06/03/2017 5:33:45 PM By: Curtis Sites Entered By: Curtis Sites on 06/03/2017 15:08:40 Jessica [name] ([id number]) -------------------------------------------------------------------------------- Wound Assessment Details [name]. Date of Service: 06/03/2017 3:00 PM Medical Record Number: [id number] Patient Account Number: [id number] Date of Birth/Sex: [birth year] (71 y.o. Female) Treating RN: Curtis Sites Primary Care Boniface Goffe: Annita Brod Other Clinician: Referring Amara Manalang: CENTER, CHARLES Treating Toniesha Zellner/Extender: Bonnell Public Weeks in Treatment: 1 Wound Status Wound Number: 3 Primary Pressure Ulcer Etiology: Wound Location: Right Lower Leg - Lateral Wound Status: Open Wounding Event: Gradually Appeared Comorbid Anemia, Lymphedema, Deep Vein Date Acquired: 04/28/2017 History: Thrombosis, Quadriplegia Weeks Of Treatment: 1 Clustered Wound: No Photos Photo Uploaded By: Curtis Sites on 06/04/2017 11:15:19 Wound Measurements Length: (cm)  0.6 Width: (cm) 0.6 Depth: (cm) 0.1 Area: (cm) 0.283 Volume: (cm) 0.028 % Reduction in Area: 0% % Reduction in Volume: 0% Epithelialization: Large (67-100%) Tunneling: No Undermining: No Wound Description Classification: Category/Stage II Foul O Wound Margin: Flat and Intact Slough Exudate Amount: None Present dor After Cleansing: No /Fibrino  Yes Wound Bed Granulation Amount: Large (67-100%) Exposed Structure Granulation Quality: Red Fascia Exposed: No Necrotic Amount: Small (1-33%) Fat Layer (Subcutaneous Tissue) Exposed: No Necrotic Quality: Eschar Tendon Exposed: No Muscle Exposed: No Joint Exposed: No Bone Exposed: No Periwound Skin Texture Texture Color No Abnormalities Noted: No No Abnormalities Noted: No Jessica BradyOVERMAN, Lucelia W. ([id number]) Moisture No Abnormalities Noted: No Wound Preparation Ulcer Cleansing: Rinsed/Irrigated with Saline Topical Anesthetic Applied: Other: LIDOCAINE 4%, Treatment Notes Wound #3 (Right, Lateral Lower Leg) 1. Cleansed with: Clean wound with Normal Saline 2. Anesthetic Topical Lidocaine 4% cream to wound bed prior to debridement 4. Dressing Applied: Santyl Ointment 5. Secondary Dressing Applied ABD Pad Notes kerlix wrap Electronic Signature(s) Signed: 06/03/2017 5:33:45 PM By: Curtis Sitesorthy, Joanna Entered By: Curtis Sitesorthy, Joanna on 06/03/2017 15:10:02 Jessica BradyVERMAN, Rebecca W. ([id number]) -------------------------------------------------------------------------------- Vitals Details Patient Name: Jessica BradyVERMAN, Eloise W. Date of Service: 06/03/2017 3:00 PM Medical Record Number: [id number] Patient Account Number: [id number] Date of Birth/Sex: Apr 29, 1945 (71 y.o. Female) Treating RN: Curtis Sitesorthy, Joanna Primary Care Antavius Sperbeck: Annita BrodASENSO, PHILIP Other Clinician: Referring Zuha Dejonge: CENTER, CHARLES Treating Demarr Kluever/Extender: Kathreen Cosieroulter, Leah Weeks in Treatment: 1 Vital Signs Time Taken: 14:59 Temperature (F): 98.1 Pulse (bpm): 119 Respiratory Rate (breaths/min): 14 Blood Pressure (mmHg): 130/67 Reference Range: 80 - 120 mg / dl Electronic Signature(s) Signed: 06/03/2017 5:33:45 PM By: Curtis Sitesorthy, Joanna Entered By: Curtis Sitesorthy, Joanna on 06/03/2017 14:59:36

## 2017-06-10 ENCOUNTER — Encounter: Payer: Medicare Other | Admitting: Nurse Practitioner

## 2017-06-10 DIAGNOSIS — L89153 Pressure ulcer of sacral region, stage 3: Secondary | ICD-10-CM | POA: Diagnosis not present

## 2017-06-11 ENCOUNTER — Other Ambulatory Visit
Admission: RE | Admit: 2017-06-11 | Discharge: 2017-06-11 | Disposition: A | Discharge status: Home / Self Care | Payer: Medicare Other | Source: Other Acute Inpatient Hospital | Attending: Nurse Practitioner | Admitting: Nurse Practitioner

## 2017-06-11 DIAGNOSIS — B998 Other infectious disease: Secondary | ICD-10-CM | POA: Diagnosis present

## 2017-06-13 LAB — AEROBIC CULTURE W GRAM STAIN (SUPERFICIAL SPECIMEN)

## 2017-06-13 LAB — AEROBIC CULTURE  (SUPERFICIAL SPECIMEN)

## 2017-06-13 NOTE — Progress Notes (Signed)
BUFFEY, ZABINSKI ([id number]) Visit Report for 06/10/2017 Arrival Information Details [name], Jessica [name]. Date of Service: 06/10/2017 3:30 PM Medical Record Number: [id number] Patient Account Number: [id number] Date of Birth/Sex: 1945/04/29 (71 y.o. Female) Treating RN: Curtis Sites Primary Care Meila Berke: Annita Brod Other Clinician: Referring Aveer Bartow: Annita Brod Treating Malvin Morrish/Extender: Kathreen Cosier in Treatment: 2 Visit Information History Since Last Visit Added or deleted any medications: No Patient Arrived: Stretcher Any new allergies or adverse reactions: No Arrival Time: 15:22 Had a fall or experienced change in No Accompanied By: spouse and son activities of daily living that may affect Transfer Assistance: Stretcher risk of falls: Patient Identification Verified: Yes Signs or symptoms of abuse/neglect since last visito No Secondary Verification Process Completed: Yes Hospitalized since last visit: No Patient Has Alerts: Yes Has Dressing in Place as Prescribed: Yes Patient Alerts: allergic to zinc Pain Present Now: No Electronic Signature(s) Signed: 06/10/2017 4:10:15 PM By: Curtis Sites Entered By: Curtis Sites on 06/10/2017 15:25:05 Jessica [name] ([id number]) -------------------------------------------------------------------------------- Encounter Discharge Information Details [name]. Date of Service: 06/10/2017 3:30 PM Medical Record Number: [id number] Patient Account Number: [id number] Date of Birth/Sex: 1946-01-29 (72 y.o. Female) Treating RN: Ashok Cordia, Debi Primary Care Danaka Llera: Annita Brod Other Clinician: Referring Eisley Barber: Annita Brod Treating Mackenna Kamer/Extender: Kathreen Cosier in Treatment: 2 Encounter Discharge Information Items Discharge Pain Level: 0 Discharge Condition: Stable Ambulatory Status: Stretcher Discharge Destination: Home Transportation: Ambulance Accompanied  By: husband Schedule Follow-up Appointment: Yes Medication Reconciliation completed and No provided to Patient/Care Franz Svec: Provided on Clinical Summary of Care: 06/10/2017 Form Type Recipient Paper Patient PO Electronic Signature(s) Signed: 06/11/2017 5:56:09 PM By: Renne Crigler Entered By: Renne Crigler on 06/10/2017 16:15:33 Jessica [name] ([id number]) -------------------------------------------------------------------------------- Lower Extremity Assessment Details [name]. Date of Service: 06/10/2017 3:30 PM Medical Record Number: [id number] Patient Account Number: [id number] Date of Birth/Sex: Jan 21, 1946 (72 y.o. Female) Treating RN: Curtis Sites Primary Care Hiro Vipond: Annita Brod Other Clinician: Referring Elazar Argabright: Annita Brod Treating Naira Standiford/Extender: Kathreen Cosier in Treatment: 2 Vascular Assessment Pulses: Dorsalis Pedis Palpable: [Left:Yes] [Right:Yes] Posterior Tibial Extremity colors, hair growth, and conditions: Extremity Color: [Left:Normal] [Right:Normal] Hair Growth on Extremity: [Left:No] [Right:No] Temperature of Extremity: [Left:Warm] [Right:Warm] Capillary Refill: [Left:< 3 seconds] [Right:< 3 seconds] Electronic Signature(s) Signed: 06/10/2017 4:10:15 PM By: Curtis Sites Entered By: Curtis Sites on 06/10/2017 15:44:27 Jessica [name] ([id number]) -------------------------------------------------------------------------------- Multi Wound Chart Details [name]. Date of Service: 06/10/2017 3:30 PM Medical Record Number: [id number] Patient Account Number: [id number] Date of Birth/Sex: 09/29/1945 (73 y.o. Female) Treating RN: Ashok Cordia, Debi Primary Care Masiya Claassen: Annita Brod Other Clinician: Referring Eliaz Fout: Annita Brod Treating Graylyn Bunney/Extender: Kathreen Cosier in Treatment: 2 Vital Signs Height(in): Pulse(bpm): 93 Weight(lbs): Blood Pressure(mmHg):  120/67 Body Mass Index(BMI): Temperature(F): 97.6 Respiratory Rate 14 (breaths/min): Photos: Wound Location: Sacrum - Medial Left Lower Leg Right Lower Leg - Lateral Wounding Event: Pressure Injury Gradually Appeared Gradually Appeared Primary Etiology: Pressure Ulcer Pressure Ulcer Pressure Ulcer Comorbid History: Anemia, Lymphedema, Deep Anemia, Lymphedema, Deep Anemia, Lymphedema, Deep Vein Thrombosis, Quadriplegia Vein Thrombosis, Quadriplegia Vein Thrombosis, Quadriplegia Date Acquired: 01/15/2017 04/28/2017 04/28/2017 Weeks of Treatment: 2 2 2  Wound Status: Open Open Open Measurements L x W x D 10.9x4.5x2.3 0.6x0.5x0.1 0.6x0.3x0.1 (cm) Area (cm) : 38.524 0.236 0.141 Volume (cm) : 88.605 0.024 0.014 % Reduction in Area: -4.50% 49.90% 50.20% % Reduction in Volume: -140.40% 74.50% 50.00% Classification: Category/Stage IV Category/Stage III Category/Stage II Exudate Amount: Large Medium None Present Exudate Type:  Serous Serous N/A Exudate Color: amber amber N/A Wound Margin: Distinct, outline attached Flat and Intact Flat and Intact Granulation Amount: Large (67-100%) Medium (34-66%) Large (67-100%) Granulation Quality: Red Red Red Necrotic Amount: Small (1-33%) Medium (34-66%) Small (1-33%) Necrotic Tissue: Adherent Slough Eschar Eschar Exposed Structures: Fat Layer (Subcutaneous Fascia: No Fascia: No Tissue) Exposed: Yes Fat Layer (Subcutaneous Fat Layer (Subcutaneous Muscle: Yes Tissue) Exposed: No Tissue) Exposed: No Bone: Yes Tendon: No Tendon: No Fascia: No Muscle: No Muscle: No Jessica [name], Jessica [name] ([id number]) Tendon: No Joint: No Joint: No Joint: No Bone: No Bone: No Epithelialization: None Large (67-100%) Large (67-100%) Debridement: Debridement ([id number]) Debridement ([id number]) N/A Pre-procedure 15:50 15:50 N/A Verification/Time Out Taken: Pain Control: Lidocaine 4% Topical Solution Lidocaine 4% Topical Solution N/A Tissue Debrided:  Fibrin/Slough, Exudates, Fibrin/Slough, Exudates, N/A Subcutaneous Subcutaneous Level: Skin/Subcutaneous Tissue Skin/Subcutaneous Tissue N/A Debridement Area (sq cm): 49.05 0.3 N/A Instrument: Curette Curette N/A Specimen: Swab None N/A Number of Specimens 1 N/A N/A Taken: Bleeding: Minimum Minimum N/A Hemostasis Achieved: Pressure Pressure N/A Procedural Pain: 0 0 N/A Post Procedural Pain: 0 0 N/A Debridement Treatment Procedure was tolerated well Procedure was tolerated well N/A Response: Post Debridement 10.9x4.5x2.4 0.6x0.5x0.2 N/A Measurements L x W x D (cm) Post Debridement Volume: 92.457 0.047 N/A (cm) Post Debridement Stage: Category/Stage IV Category/Stage III N/A Periwound Skin Texture: Induration: Yes Excoriation: No No Abnormalities Noted Excoriation: No Induration: No Callus: No Callus: No Crepitus: No Crepitus: No Rash: No Rash: No Scarring: No Scarring: No Periwound Skin Moisture: Maceration: No Maceration: No No Abnormalities Noted Dry/Scaly: No Dry/Scaly: No Periwound Skin Color: Atrophie Blanche: No Atrophie Blanche: No No Abnormalities Noted Cyanosis: No Cyanosis: No Ecchymosis: No Ecchymosis: No Erythema: No Erythema: No Hemosiderin Staining: No Hemosiderin Staining: No Mottled: No Mottled: No Pallor: No Pallor: No Rubor: No Rubor: No Temperature: No Abnormality No Abnormality N/A Tenderness on Palpation: Yes No No Wound Preparation: Ulcer Cleansing: Ulcer Cleansing: Ulcer Cleansing: Rinsed/Irrigated with Saline Rinsed/Irrigated with Saline Rinsed/Irrigated with Saline Topical Anesthetic Applied: Topical Anesthetic Applied: Topical Anesthetic Applied: Other: lidocaine 4% Other: LIDOCAINE 4% Other: LIDOCAINE 4% Procedures Performed: Debridement Debridement N/A Treatment Notes Wound #1 (Medial Sacrum) 1. Cleansed with: Clean wound with Normal Saline 2. Anesthetic Jessica [name], SNELL. ([id number]) Topical Lidocaine 4% cream to  wound bed prior to debridement 4. Dressing Applied: Hydrafera Blue 5. Secondary Dressing Applied ABD Pad Notes tape and tegaderm to protect dressing due to patient having bowel movement incontentinent Wound #2 (Left Lower Leg) 1. Cleansed with: Clean wound with Normal Saline 2. Anesthetic Topical Lidocaine 4% cream to wound bed prior to debridement 4. Dressing Applied: Santyl Ointment 5. Secondary Dressing Applied Bordered Foam Dressing Wound #3 (Right, Lateral Lower Leg) 1. Cleansed with: Clean wound with Normal Saline 2. Anesthetic Topical Lidocaine 4% cream to wound bed prior to debridement 4. Dressing Applied: Santyl Ointment 5. Secondary Dressing Applied Bordered Foam Dressing Electronic Signature(s) Signed: 06/10/2017 4:56:44 PM By: Bonnell Public Entered By: Bonnell Public on 06/10/2017 16:56:44 Jessica [name] ([id number]) -------------------------------------------------------------------------------- Multi-Disciplinary Care Plan Details [name]. Date of Service: 06/10/2017 3:30 PM Medical Record Number: [id number] Patient Account Number: [id number] Date of Birth/Sex: Apr 25, 1945 (72 y.o. Female) Treating RN: Ashok Cordia, Debi Primary Care Boaz Berisha: Annita Brod Other Clinician: Referring Vernia Teem: Annita Brod Treating [name]/Extender: Kathreen Cosier in Treatment: 2 Active Inactive ` Orientation to the Wound Care Program Nursing Diagnoses: Knowledge deficit related to the wound healing center program Goals: Patient/caregiver will verbalize understanding of the Wound Healing Center  Program Date Initiated: 06/03/2017 Target Resolution Date: 06/17/2017 Goal Status: Active Interventions: Provide education on orientation to the wound center Notes: ` Pressure Nursing Diagnoses: Knowledge deficit related to causes and risk factors for pressure ulcer development Knowledge deficit related to management of pressures ulcers Potential  for impaired tissue integrity related to pressure, friction, moisture, and shear Goals: Patient will remain free from development of additional pressure ulcers Date Initiated: 06/03/2017 Target Resolution Date: 06/17/2017 Goal Status: Active Patient will remain free of pressure ulcers Date Initiated: 06/03/2017 Target Resolution Date: 06/17/2017 Goal Status: Active Patient/caregiver will verbalize risk factors for pressure ulcer development Date Initiated: 06/03/2017 Target Resolution Date: 06/17/2017 Goal Status: Active Interventions: Assess: immobility, friction, shearing, incontinence upon admission and as needed Assess potential for pressure ulcer upon admission and as needed Notes: ` Wound/Skin Impairment Jessica [name], Jessica [name] ([id number]) Nursing Diagnoses: Impaired tissue integrity Goals: Patient/caregiver will verbalize understanding of skin care regimen Date Initiated: 06/03/2017 Target Resolution Date: 06/17/2017 Goal Status: Active Ulcer/skin breakdown will have a volume reduction of 30% by week 4 Date Initiated: 06/03/2017 Target Resolution Date: 06/17/2017 Goal Status: Active Interventions: Assess ulceration(s) every visit Treatment Activities: Patient referred to home care : 06/03/2017 Skin care regimen initiated : 06/03/2017 Notes: Electronic Signature(s) Signed: 06/10/2017 4:59:29 PM By: Alejandro Mulling Entered By: Alejandro Mulling on 06/10/2017 15:50:28 Jessica [name] ([id number]) -------------------------------------------------------------------------------- Pain Assessment Details [name]. Date of Service: 06/10/2017 3:30 PM Medical Record Number: [id number] Patient Account Number: [id number] Date of Birth/Sex: [birth year] (72 y.o. Female) Treating RN: Curtis Sites Primary Care Gissella Jessica [name]: Annita Brod Other Clinician: Referring Kasim Mccorkle: Annita Brod Treating Zayaan Kozak/Extender: Kathreen Cosier in Treatment: 2 Active  Problems Location of Pain Severity and Description of Pain Patient Has Paino No Site Locations Pain Management and Medication Current Pain Management: Electronic Signature(s) Signed: 06/10/2017 4:10:15 PM By: Curtis Sites Entered By: Curtis Sites on 06/10/2017 15:26:41 Jessica [name] ([id number]) -------------------------------------------------------------------------------- Patient/Caregiver Education Details [name]. Date of Service: 06/10/2017 3:30 PM Medical Record Number: [id number] Patient Account Number: [id number] Date of Birth/Gender: 10-Jul-1945 (72 y.o. Female) Treating RN: Renne Crigler Primary Care Physician: Annita Brod Other Clinician: Referring Physician: Annita Brod Treating Physician/Extender: Kathreen Cosier in Treatment: 2 Education Assessment Education Provided To: Patient Education Topics Provided Wound Debridement: Handouts: Wound Debridement Methods: Explain/Verbal Responses: State content correctly Wound/Skin Impairment: Handouts: Caring for Your Ulcer Methods: Explain/Verbal Responses: State content correctly Electronic Signature(s) Signed: 06/11/2017 5:56:09 PM By: Renne Crigler Entered By: Renne Crigler on 06/10/2017 16:15:52 Jessica [name] ([id number]) -------------------------------------------------------------------------------- Wound Assessment Details [name]. Date of Service: 06/10/2017 3:30 PM Medical Record Number: [id number] Patient Account Number: [id number] Date of Birth/Sex: 1946-02-19 (72 y.o. Female) Treating RN: Curtis Sites Primary Care Navjot Pilgrim: Annita Brod Other Clinician: Referring Ellora Varnum: Annita Brod Treating Darleth Eustache/Extender: Kathreen Cosier in Treatment: 2 Wound Status Wound Number: 1 Primary Pressure Ulcer Etiology: Wound Location: Sacrum - Medial Wound Status: Open Wounding Event: Pressure Injury Comorbid Anemia, Lymphedema,  Deep Vein Date Acquired: 01/15/2017 History: Thrombosis, Quadriplegia Weeks Of Treatment: 2 Clustered Wound: No Photos Photo Uploaded By: Elliot Gurney, BSN, RN, CWS, Kim on 06/10/2017 16:11:09 Wound Measurements Length: (cm) 10.9 Width: (cm) 4.5 Depth: (cm) 2.3 Area: (cm) 38.524 Volume: (cm) 88.605 % Reduction in Area: -4.5% % Reduction in Volume: -140.4% Epithelialization: None Tunneling: No Undermining: No Wound Description Classification: Category/Stage IV Wound Margin: Distinct, outline attached Exudate Amount: Large Exudate Type: Serous Exudate Color: amber Foul Odor After Cleansing: No Slough/Fibrino Yes Wound Bed  Granulation Amount: Large (67-100%) Exposed Structure Granulation Quality: Red Fascia Exposed: No Necrotic Amount: Small (1-33%) Fat Layer (Subcutaneous Tissue) Exposed: Yes Necrotic Quality: Adherent Slough Tendon Exposed: No Muscle Exposed: Yes Necrosis of Muscle: No Joint Exposed: No Bone Exposed: Yes Periwound Skin Texture Jessica [name], SORENSON. ([id number]) Texture Color No Abnormalities Noted: No No Abnormalities Noted: No Callus: No Atrophie Blanche: No Crepitus: No Cyanosis: No Excoriation: No Ecchymosis: No Induration: Yes Erythema: No Rash: No Hemosiderin Staining: No Scarring: No Mottled: No Pallor: No Moisture Rubor: No No Abnormalities Noted: No Dry / Scaly: No Temperature / Pain Maceration: No Temperature: No Abnormality Tenderness on Palpation: Yes Wound Preparation Ulcer Cleansing: Rinsed/Irrigated with Saline Topical Anesthetic Applied: Other: lidocaine 4%, Treatment Notes Wound #1 (Medial Sacrum) 1. Cleansed with: Clean wound with Normal Saline 2. Anesthetic Topical Lidocaine 4% cream to wound bed prior to debridement 4. Dressing Applied: Hydrafera Blue 5. Secondary Dressing Applied ABD Pad Notes tape and tegaderm to protect dressing due to patient having bowel movement incontentinent Electronic  Signature(s) Signed: 06/10/2017 4:10:15 PM By: Curtis Sites Entered By: Curtis Sites on 06/10/2017 15:43:38 Jessica [name] ([id number]) -------------------------------------------------------------------------------- Wound Assessment Details [name]. Date of Service: 06/10/2017 3:30 PM Medical Record Number: [id number] Patient Account Number: [id number] Date of Birth/Sex: 24-Jun-1945 (72 y.o. Female) Treating RN: Curtis Sites Primary Care Babyboy Loya: Annita Brod Other Clinician: Referring Axiel Fjeld: Annita Brod Treating Jaydrien Wassenaar/Extender: Kathreen Cosier in Treatment: 2 Wound Status Wound Number: 2 Primary Pressure Ulcer Etiology: Wound Location: Left Lower Leg Wound Status: Open Wounding Event: Gradually Appeared Comorbid Anemia, Lymphedema, Deep Vein Date Acquired: 04/28/2017 History: Thrombosis, Quadriplegia Weeks Of Treatment: 2 Clustered Wound: No Photos Photo Uploaded By: Elliot Gurney, BSN, RN, CWS, Kim on 06/10/2017 16:11:10 Wound Measurements Length: (cm) 0.6 Width: (cm) 0.5 Depth: (cm) 0.1 Area: (cm) 0.236 Volume: (cm) 0.024 % Reduction in Area: 49.9% % Reduction in Volume: 74.5% Epithelialization: Large (67-100%) Tunneling: No Undermining: No Wound Description Classification: Category/Stage III Foul Odor Wound Margin: Flat and Intact Slough/Fi Exudate Amount: Medium Exudate Type: Serous Exudate Color: amber After Cleansing: No brino Yes Wound Bed Granulation Amount: Medium (34-66%) Exposed Structure Granulation Quality: Red Fascia Exposed: No Necrotic Amount: Medium (34-66%) Fat Layer (Subcutaneous Tissue) Exposed: No Necrotic Quality: Eschar Tendon Exposed: No Muscle Exposed: No Joint Exposed: No Bone Exposed: No Periwound Skin Texture Jessica [name], [name] ([id number]) Texture Color No Abnormalities Noted: No No Abnormalities Noted: No Callus: No Atrophie Blanche: No Crepitus: No Cyanosis: No Excoriation:  No Ecchymosis: No Induration: No Erythema: No Rash: No Hemosiderin Staining: No Scarring: No Mottled: No Pallor: No Moisture Rubor: No No Abnormalities Noted: No Dry / Scaly: No Temperature / Pain Maceration: No Temperature: No Abnormality Wound Preparation Ulcer Cleansing: Rinsed/Irrigated with Saline Topical Anesthetic Applied: Other: LIDOCAINE 4%, Treatment Notes Wound #2 (Left Lower Leg) 1. Cleansed with: Clean wound with Normal Saline 2. Anesthetic Topical Lidocaine 4% cream to wound bed prior to debridement 4. Dressing Applied: Santyl Ointment 5. Secondary Dressing Applied Bordered Foam Dressing Electronic Signature(s) Signed: 06/10/2017 4:10:15 PM By: Curtis Sites Entered By: Curtis Sites on 06/10/2017 15:43:50 Jessica [name] ([id number]) -------------------------------------------------------------------------------- Wound Assessment Details [name]. Date of Service: 06/10/2017 3:30 PM Medical Record Number: [id number] Patient Account Number: [id number] Date of Birth/Sex: [birth year] (72 y.o. Female) Treating RN: Curtis Sites Primary Care Luisfernando Brightwell: Annita Brod Other Clinician: Referring Eulia Hatcher: Annita Brod Treating Drew Lips/Extender: Kathreen Cosier in Treatment: 2 Wound Status Wound Number: 3 Primary Pressure Ulcer Etiology: Wound Location:  Right Lower Leg - Lateral Wound Status: Open Wounding Event: Gradually Appeared Comorbid Anemia, Lymphedema, Deep Vein Date Acquired: 04/28/2017 History: Thrombosis, Quadriplegia Weeks Of Treatment: 2 Clustered Wound: No Photos Photo Uploaded By: Elliot Gurney, BSN, RN, CWS, Kim on 06/10/2017 16:12:52 Wound Measurements Length: (cm) 0.6 Width: (cm) 0.3 Depth: (cm) 0.1 Area: (cm) 0.141 Volume: (cm) 0.014 % Reduction in Area: 50.2% % Reduction in Volume: 50% Epithelialization: Large (67-100%) Tunneling: No Undermining: No Wound Description Classification:  Category/Stage II Foul Od Wound Margin: Flat and Intact Slough/ Exudate Amount: None Present or After Cleansing: No Fibrino Yes Wound Bed Granulation Amount: Large (67-100%) Exposed Structure Granulation Quality: Red Fascia Exposed: No Necrotic Amount: Small (1-33%) Fat Layer (Subcutaneous Tissue) Exposed: No Necrotic Quality: Eschar Tendon Exposed: No Muscle Exposed: No Joint Exposed: No Bone Exposed: No Periwound Skin Texture Texture Color No Abnormalities Noted: No No Abnormalities Noted: No Jessica [name], Jessica [name]. ([id number]) Moisture No Abnormalities Noted: No Wound Preparation Ulcer Cleansing: Rinsed/Irrigated with Saline Topical Anesthetic Applied: Other: LIDOCAINE 4%, Treatment Notes Wound #3 (Right, Lateral Lower Leg) 1. Cleansed with: Clean wound with Normal Saline 2. Anesthetic Topical Lidocaine 4% cream to wound bed prior to debridement 4. Dressing Applied: Santyl Ointment 5. Secondary Dressing Applied Bordered Foam Dressing Electronic Signature(s) Signed: 06/10/2017 4:10:15 PM By: Curtis Sites Entered By: Curtis Sites on 06/10/2017 15:43:58 Jessica [name] ([id number]) -------------------------------------------------------------------------------- Vitals Details [name]. Date of Service: 06/10/2017 3:30 PM Medical Record Number: [id number] Patient Account Number: [id number] Date of Birth/Sex: 1946-01-01 (72 y.o. Female) Treating RN: Curtis Sites Primary Care Gareth Fitzner: Annita Brod Other Clinician: Referring Jordayn Mink: Annita Brod Treating Valiant Dills/Extender: Kathreen Cosier in Treatment: 2 Vital Signs Time Taken: 15:26 Temperature (F): 97.6 Pulse (bpm): 93 Respiratory Rate (breaths/min): 14 Blood Pressure (mmHg): 120/67 Reference Range: 80 - 120 mg / dl Electronic Signature(s) Signed: 06/10/2017 4:10:15 PM By: Curtis Sites Entered By: Curtis Sites on 06/10/2017 15:27:52

## 2017-06-17 ENCOUNTER — Ambulatory Visit: Payer: Medicare Other | Admitting: Nurse Practitioner

## 2017-06-18 ENCOUNTER — Encounter: Payer: Medicare Other | Attending: Physician Assistant | Admitting: Physician Assistant

## 2017-06-18 DIAGNOSIS — L89154 Pressure ulcer of sacral region, stage 4: Secondary | ICD-10-CM | POA: Insufficient documentation

## 2017-06-18 DIAGNOSIS — L97819 Non-pressure chronic ulcer of other part of right lower leg with unspecified severity: Secondary | ICD-10-CM | POA: Insufficient documentation

## 2017-06-18 DIAGNOSIS — I890 Lymphedema, not elsewhere classified: Secondary | ICD-10-CM | POA: Diagnosis not present

## 2017-06-18 DIAGNOSIS — Z86718 Personal history of other venous thrombosis and embolism: Secondary | ICD-10-CM | POA: Insufficient documentation

## 2017-06-18 DIAGNOSIS — G8250 Quadriplegia, unspecified: Secondary | ICD-10-CM | POA: Diagnosis not present

## 2017-06-18 DIAGNOSIS — L97829 Non-pressure chronic ulcer of other part of left lower leg with unspecified severity: Secondary | ICD-10-CM | POA: Diagnosis not present

## 2017-06-18 NOTE — Progress Notes (Signed)
MENDY, CHOU ([id number]) Visit Report for 06/10/2017 Chief Complaint Document Details [name], Jessica [name]. Date of Service: 06/10/2017 3:30 PM Medical Record Number: [id number] Patient Account Number: [id number] Date of Birth/Sex: [birth year] (72 y.o. Female) Treating RN: Ashok Cordia, Debi Primary Care Provider: Annita Brod Other Clinician: Referring Provider: Annita Brod Treating Provider/Extender: Kathreen Cosier in Treatment: 2 Information Obtained from: Patient Chief Complaint she is here for evaluation of a sacral ulcer and bilateral lower extremity ulcers Electronic Signature(s) Signed: 06/10/2017 4:57:04 PM By: Bonnell Public Entered By: Bonnell Public on 06/10/2017 16:57:04 Jessica [name] ([id number]) -------------------------------------------------------------------------------- Debridement Details [name]. Date of Service: 06/10/2017 3:30 PM Medical Record Number: [id number] Patient Account Number: [id number] Date of Birth/Sex: 09-16-1945 (72 y.o. Female) Treating RN: Ashok Cordia, Debi Primary Care Provider: Annita Brod Other Clinician: Referring Provider: Annita Brod Treating Provider/Extender: Kathreen Cosier in Treatment: 2 Debridement Performed for Wound #1 Medial Sacrum Assessment: Performed By: Physician Bonnell Public, NP Debridement: Debridement Pre-procedure Verification/Time Yes - 15:50 Out Taken: Start Time: 15:51 Pain Control: Lidocaine 4% Topical Solution Level: Skin/Subcutaneous Tissue Total Area Debrided (L x W): 10.9 (cm) x 4.5 (cm) = 49.05 (cm) Tissue and other material Viable, Non-Viable, Exudate, Fibrin/Slough, Subcutaneous debrided: Instrument: Curette Specimen: Swab Number of Specimens Taken: 1 Bleeding: Minimum Hemostasis Achieved: Pressure End Time: 15:54 Procedural Pain: 0 Post Procedural Pain: 0 Response to Treatment: Procedure was tolerated well Post Debridement  Measurements of Total Wound Length: (cm) 10.9 Stage: Category/Stage IV Width: (cm) 4.5 Depth: (cm) 2.4 Volume: (cm) 92.457 Character of Wound/Ulcer Post Requires Further Debridement Debridement: Post Procedure Diagnosis Same as Pre-procedure Electronic Signature(s) Signed: 06/10/2017 4:59:29 PM By: Alejandro Mulling Signed: 06/17/2017 1:53:22 PM By: Bonnell Public Entered By: Alejandro Mulling on 06/10/2017 15:54:26 Jessica [name] ([id number]) -------------------------------------------------------------------------------- Debridement Details [name]. Date of Service: 06/10/2017 3:30 PM Medical Record Number: [id number] Patient Account Number: [id number] Date of Birth/Sex: 1945/08/19 (72 y.o. Female) Treating RN: Ashok Cordia, Debi Primary Care Provider: Annita Brod Other Clinician: Referring Provider: Annita Brod Treating Provider/Extender: Kathreen Cosier in Treatment: 2 Debridement Performed for Wound #2 Left Lower Leg Assessment: Performed By: Physician Bonnell Public, NP Debridement: Debridement Pre-procedure Verification/Time Yes - 15:50 Out Taken: Start Time: 15:54 Pain Control: Lidocaine 4% Topical Solution Level: Skin/Subcutaneous Tissue Total Area Debrided (L x W): 0.6 (cm) x 0.5 (cm) = 0.3 (cm) Tissue and other material Viable, Non-Viable, Exudate, Fibrin/Slough, Subcutaneous debrided: Instrument: Curette Bleeding: Minimum Hemostasis Achieved: Pressure End Time: 15:54 Procedural Pain: 0 Post Procedural Pain: 0 Response to Treatment: Procedure was tolerated well Post Debridement Measurements of Total Wound Length: (cm) 0.6 Stage: Category/Stage III Width: (cm) 0.5 Depth: (cm) 0.2 Volume: (cm) 0.047 Character of Wound/Ulcer Post Requires Further Debridement Debridement: Post Procedure Diagnosis Same as Pre-procedure Electronic Signature(s) Signed: 06/10/2017 4:59:29 PM By: Alejandro Mulling Signed: 06/17/2017 1:53:22  PM By: Bonnell Public Entered By: Alejandro Mulling on 06/10/2017 15:56:37 Jessica [name] ([id number]) -------------------------------------------------------------------------------- HPI Details [name]. Date of Service: 06/10/2017 3:30 PM Medical Record Number: [id number] Patient Account Number: [id number] Date of Birth/Sex: [birth year] (72 y.o. Female) Treating RN: Ashok Cordia, Debi Primary Care Provider: Annita Brod Other Clinician: Referring Provider: Annita Brod Treating Provider/Extender: Kathreen Cosier in Treatment: 2 History of Present Illness HPI Description: 05/27/17-she is here in initial evaluation for a left-sided sacral stage IV pressure ulcer and bilateral lower extremity, lateral aspect, unstageable pressure ulcers. She is accompanied by her husband and her son, who are her primary caregivers.  She is bedbound secondary to spinal stenosis. According to her son and husband she was hospitalized from 10/5-11/1 with healthcare associated pneumonia and altered mental status. During her hospitalization she was intubated, extubated on 10/27. She was discharged with Foley catheter and follow up with urology. According to her son and spouse she developed the sacral ulcer during hospitalization. Home health has been applying Santyl daily. She does have a low air loss mattress and is repositioned every 2-3 hours per her family's report. According to the son and spouse she had an appointment with urology on 12/18 and during that appointment developed discoloration to her bilateral lower extremities which ultimately developed into unstageable pressure ulcers to the lateral aspects of her bilateral lower extremities. There has been no topical treatment applied to these. She continues to have home health. There is no concerns expressed regarding dietary intake, stating she eats 3 meals a day, eating was provided; she is supplemented with boost with  protein. 06/03/17-she is here in follow-up evaluation for sacral and bilateral lower extremity ulcers. Plain film x-ray done today reveals no distraction to the sacrum or coccyx, no visible abnormalities. Home health has ordered the negative pressure wound system but it has not been initiated. We will continue with Hydrofera Blue until initiation of negative pressure wound system and continue with Santyl to bilateral lower extremity ulcers. Follow-up next week 06/10/17-she is here in follow-up evaluation for sacral and bilateral lower extremity ulcers. The wound VAC will be available tomorrow per home health. We will initiate wound VAC therapy to the sacral ulcer 3 times weekly (Thursday, Saturday/Sunday, Tuesday). We will continue with Santyl to the lower extremity ulcers. The patient's son is checking into home health therapy over the weekend for [facility] changes, with the understanding that if VAC changes cannot be performed over the weekend he will need to change his mother's appointments to Monday, Wednesday or Friday. The sacral ulcer clinically does not appear infected but there has been a change in the amount of drainage acutely, there is no significant amount of devitalized tissue, there is no malodor. Wound culture was obtained to evaluate for occult infection we will hold off on antibiotic therapy until sensitivities are resulted. Electronic Signature(s) Signed: 06/10/2017 4:58:45 PM By: Bonnell Public Entered By: Bonnell Public on 06/10/2017 16:58:45 Jessica [name] ([id number]) -------------------------------------------------------------------------------- Physician Orders Details [name]. Date of Service: 06/10/2017 3:30 PM Medical Record Number: [id number] Patient Account Number: [id number] Date of Birth/Sex: 1945/12/28 (72 y.o. Female) Treating RN: Ashok Cordia, Debi Primary Care Provider: Annita Brod Other Clinician: Referring Provider: Annita Brod Treating Provider/Extender: Kathreen Cosier in Treatment: 2 Verbal / Phone Orders: Yes Clinician: Pinkerton, Debi Read Back and Verified: Yes Diagnosis Coding Wound Cleansing Wound #1 Medial Sacrum o Clean wound with Normal Saline. Anesthetic (add to Medication List) Wound #1 Medial Sacrum o Topical Lidocaine 4% cream applied to wound bed prior to debridement (In Clinic Only). Wound #2 Left Lower Leg o Topical Lidocaine 4% cream applied to wound bed prior to debridement (In Clinic Only). Wound #3 Right,Lateral Lower Leg o Topical Lidocaine 4% cream applied to wound bed prior to debridement (In Clinic Only). Primary Wound Dressing Wound #1 Medial Sacrum o Hydrafera Blue - TO WOUND until the wound vac is placed this weekend (Saturday) Wound #2 Left Lower Leg o Santyl Ointment - PLACE ON OPEN WOUND AREAS ON LATERAL LOWER LEGS Wound #3 Right,Lateral Lower Leg o Santyl Ointment - PLACE ON OPEN WOUND AREAS ON LATERAL LOWER LEGS  Secondary Dressing Wound #1 Medial Sacrum o ABD pad - TAPE TO SECURE o Dry Gauze Wound #2 Left Lower Leg o Boardered Foam Dressing Wound #3 Right,Lateral Lower Leg o Boardered Foam Dressing Dressing Change Frequency Wound #1 Medial Sacrum o Three times weekly - Saturday, Tuesday, Thursday Pt is seen in Wound Care Center in Thursdays Wound #2 Left Lower Leg o Change dressing every day. Wound #3 Right,Lateral Lower Leg Jessica BradyOVERMAN, Verlinda W. ([id number]) o Change dressing every day. Follow-up Appointments o Return Appointment in 1 week. Off-Loading o Turn and reposition every 2 hours Additional Orders / Instructions o Increase protein intake. Home Health Wound #1 Medial Sacrum o Continue Home Health Visits o Home Health Nurse may visit PRN to address patientos wound care needs. o FACE TO FACE ENCOUNTER: MEDICARE and MEDICAID PATIENTS: I certify that this patient is under my care and that I had a  face-to-face encounter that meets the physician face-to-face encounter requirements with this patient on this date. The encounter with the patient was in whole or in part for the following MEDICAL CONDITION: (primary reason for Home Healthcare) MEDICAL NECESSITY: I certify, that based on my findings, NURSING services are a medically necessary home health service. HOME BOUND STATUS: I certify that my clinical findings support that this patient is homebound (i.e., Due to illness or injury, pt requires aid of supportive devices such as crutches, cane, wheelchairs, walkers, the use of special transportation or the assistance of another person to leave their place of residence. There is a normal inability to leave the home and doing so requires considerable and taxing effort. Other absences are for medical reasons / religious services and are infrequent or of short duration when for other reasons). o If current dressing causes regression in wound condition, may D/C ordered dressing product/s and apply Normal Saline Moist Dressing daily until next Wound Healing Center / Other MD appointment. Notify Wound Healing Center of regression in wound condition at [phone]. o Please direct any NON-WOUND related issues/requests for orders to patient's Primary Care Physician Wound #2 Left Lower Leg o Continue Home Health Visits o Home Health Nurse may visit PRN to address patientos wound care needs. o FACE TO FACE ENCOUNTER: MEDICARE and MEDICAID PATIENTS: I certify that this patient is under my care and that I had a face-to-face encounter that meets the physician face-to-face encounter requirements with this patient on this date. The encounter with the patient was in whole or in part for the following MEDICAL CONDITION: (primary reason for Home Healthcare) MEDICAL NECESSITY: I certify, that based on my findings, NURSING services are a medically necessary home health service. HOME BOUND STATUS: I  certify that my clinical findings support that this patient is homebound (i.e., Due to illness or injury, pt requires aid of supportive devices such as crutches, cane, wheelchairs, walkers, the use of special transportation or the assistance of another person to leave their place of residence. There is a normal inability to leave the home and doing so requires considerable and taxing effort. Other absences are for medical reasons / religious services and are infrequent or of short duration when for other reasons). o If current dressing causes regression in wound condition, may D/C ordered dressing product/s and apply Normal Saline Moist Dressing daily until next Wound Healing Center / Other MD appointment. Notify Wound Healing Center of regression in wound condition at [phone]. o Please direct any NON-WOUND related issues/requests for orders to patient's Primary Care Physician Wound #3 Right,Lateral Lower Leg o Continue  Home Health Visits o Home Health Nurse may visit PRN to address patientos wound care needs. o FACE TO FACE ENCOUNTER: MEDICARE and MEDICAID PATIENTS: I certify that this patient is under my care and that I had a face-to-face encounter that meets the physician face-to-face encounter requirements with this patient on this date. The encounter with the patient was in whole or in part for the following MEDICAL CONDITION: (primary reason for Home Healthcare) MEDICAL NECESSITY: I certify, that based on my findings, NURSING services are a medically necessary home health service. HOME BOUND STATUS: I certify that my clinical findings support that this patient is homebound (i.e., Due to illness or injury, pt requires aid of supportive devices such as crutches, cane, wheelchairs, walkers, the use of special transportation or the assistance of another person to leave their place of residence. There is a normal inability to leave the home POSEY, JASMIN. ([id number]) and  doing so requires considerable and taxing effort. Other absences are for medical reasons / religious services and are infrequent or of short duration when for other reasons). o If current dressing causes regression in wound condition, may D/C ordered dressing product/s and apply Normal Saline Moist Dressing daily until next Wound Healing Center / Other MD appointment. Notify Wound Healing Center of regression in wound condition at [phone]. o Please direct any NON-WOUND related issues/requests for orders to patient's Primary Care Physician Laboratory o Bacteria identified in Wound by Culture (MICRO) - sacrum oooo LOINC Code: 6462-6 oooo Convenience Name: Wound culture routine Patient Medications Allergies: zinc, Ambien Notifications Medication Indication Start End lidocaine DOSE 1 - topical 4 % cream - 1 cream topical Electronic Signature(s) Signed: 06/17/2017 1:53:22 PM By: Bonnell Public Previous Signature: 06/10/2017 4:59:29 PM Version By: Alejandro Mulling Entered By: Bonnell Public on 06/10/2017 16:59:31 Jessica [name] ([id number]) -------------------------------------------------------------------------------- Prescription 06/10/2017 [name] Provider: Bonnell Public NP Date of Birth: 02/08/1946 NPI#: [id number] Sex: F DEA#: [id number] Phone #: [phone] License #: Patient Address: [facility] [address] [facility] [place], [place] [address] [address] [place], [place] [address] [phone] Allergies zinc Ambien Medication Medication: Route: Strength: Form: lidocaine 4 % topical cream topical 4% cream Class: TOPICAL LOCAL ANESTHETICS Dose: Frequency / Time: Indication: 1 1 cream topical Number of Refills: Number of Units: 0 Generic Substitution: Start Date: End Date: One Time Use: Substitution Permitted No Note to  Pharmacy: Signature(s): Date(s): Electronic Signature(s) Signed: 06/17/2017 1:53:22 PM By: Bonnell Public Previous Signature: 06/10/2017 4:59:29 PM Version By: Alejandro Mulling Entered By: Bonnell Public on 06/10/2017 16:59:32 Jessica [name] ([id number] Reach, Mellody Memos ([id number]) --------------------------------------------------------------------------------  Problem List Details [name]. Date of Service: 06/10/2017 3:30 PM Medical Record Number: [id number] Patient Account Number: [id number] Date of Birth/Sex: Mar 24, 1946 (72 y.o. Female) Treating RN: Ashok Cordia, Debi Primary Care Provider: Annita Brod Other Clinician: Referring Provider: Annita Brod Treating Provider/Extender: Kathreen Cosier in Treatment: 2 Active Problems ICD-10 Encounter Code Description Active Date Diagnosis L89.154 Pressure ulcer of sacral region, stage 4 05/27/2017 Yes M48.00 Spinal stenosis, site unspecified 05/27/2017 Yes R54 Age-related physical debility 05/27/2017 Yes G89.4 Chronic pain syndrome 05/27/2017 Yes E78.2 Mixed hyperlipidemia 05/27/2017 Yes L89.95 Pressure ulcer of unspecified site, unstageable 05/27/2017 Yes Inactive Problems Resolved Problems Electronic Signature(s) Signed: 06/10/2017 4:56:36 PM By: Bonnell Public Entered By: Bonnell Public on 06/10/2017 16:56:36 Jessica [name] ([id number]) -------------------------------------------------------------------------------- Progress Note Details [name]. Date of Service: 06/10/2017 3:30 PM  Medical Record Number: [id number] Patient Account Number: [id number] Date of Birth/Sex: April 12, 1946 (72 y.o. Female) Treating RN: Ashok Cordia, Debi Primary Care Provider: Annita Brod Other Clinician: Referring Provider: Annita Brod Treating Provider/Extender: Kathreen Cosier in Treatment: 2 Subjective Chief Complaint Information obtained from Patient she is here for evaluation of a  sacral ulcer and bilateral lower extremity ulcers History of Present Illness (HPI) 05/27/17-she is here in initial evaluation for a left-sided sacral stage IV pressure ulcer and bilateral lower extremity, lateral aspect, unstageable pressure ulcers. She is accompanied by her husband and her son, who are her primary caregivers. She is bedbound secondary to spinal stenosis. According to her son and husband she was hospitalized from 10/5-11/1 with healthcare associated pneumonia and altered mental status. During her hospitalization she was intubated, extubated on 10/27. She was discharged with Foley catheter and follow up with urology. According to her son and spouse she developed the sacral ulcer during hospitalization. Home health has been applying Santyl daily. She does have a low air loss mattress and is repositioned every 2-3 hours per her family's report. According to the son and spouse she had an appointment with urology on 12/18 and during that appointment developed discoloration to her bilateral lower extremities which ultimately developed into unstageable pressure ulcers to the lateral aspects of her bilateral lower extremities. There has been no topical treatment applied to these. She continues to have home health. There is no concerns expressed regarding dietary intake, stating she eats 3 meals a day, eating was provided; she is supplemented with boost with protein. 06/03/17-she is here in follow-up evaluation for sacral and bilateral lower extremity ulcers. Plain film x-ray done today reveals no distraction to the sacrum or coccyx, no visible abnormalities. Home health has ordered the negative pressure wound system but it has not been initiated. We will continue with Hydrofera Blue until initiation of negative pressure wound system and continue with Santyl to bilateral lower extremity ulcers. Follow-up next week 06/10/17-she is here in follow-up evaluation for sacral and bilateral lower  extremity ulcers. The wound VAC will be available tomorrow per home health. We will initiate wound VAC therapy to the sacral ulcer 3 times weekly (Thursday, Saturday/Sunday, Tuesday). We will continue with Santyl to the lower extremity ulcers. The patient's son is checking into home health therapy over the weekend for [facility] changes, with the understanding that if VAC changes cannot be performed over the weekend he will need to change his mother's appointments to Monday, Wednesday or Friday. The sacral ulcer clinically does not appear infected but there has been a change in the amount of drainage acutely, there is no significant amount of devitalized tissue, there is no malodor. Wound culture was obtained to evaluate for occult infection we will hold off on antibiotic therapy until sensitivities are resulted. Patient History Information obtained from Patient. Family History Hypertension - Father, Lung Disease - Father, No family history of Cancer, Diabetes, Heart Disease, Kidney Disease, Seizures, Stroke, Thyroid Problems, Tuberculosis. Social History Never smoker, Marital Status - Married, Alcohol Use - Never, Drug Use - No History, Caffeine Use - Daily. Medical History Hospitalization/Surgery History - 01/15/2017, [facility], UTI and yeats infection with pneumonia. Jessica [name], Jessica [name] ([id number]) Objective Constitutional Vitals Time Taken: 3:26 PM, Temperature: 97.6 F, Pulse: 93 bpm, Respiratory Rate: 14 breaths/min, Blood Pressure: 120/67 mmHg. Integumentary (Hair, Skin) Wound #1 status is Open. Original cause of wound was Pressure Injury. The wound is located on the Medial Sacrum. The wound measures 10.9cm length x 4.5cm width x  2.3cm depth; 38.524cm^2 area and 88.605cm^3 volume. There is bone, muscle, and Fat Layer (Subcutaneous Tissue) Exposed exposed. There is no tunneling or undermining noted. There is a large amount of serous drainage noted. The wound margin is distinct with the outline  attached to the wound base. There is large (67-100%) red granulation within the wound bed. There is a small (1-33%) amount of necrotic tissue within the wound bed including Adherent Slough. The periwound skin appearance exhibited: Induration. The periwound skin appearance did not exhibit: Callus, Crepitus, Excoriation, Rash, Scarring, Dry/Scaly, Maceration, Atrophie Blanche, Cyanosis, Ecchymosis, Hemosiderin Staining, Mottled, Pallor, Rubor, Erythema. Periwound temperature was noted as No Abnormality. The periwound has tenderness on palpation. Wound #2 status is Open. Original cause of wound was Gradually Appeared. The wound is located on the Left Lower Leg. The wound measures 0.6cm length x 0.5cm width x 0.1cm depth; 0.236cm^2 area and 0.024cm^3 volume. There is no tunneling or undermining noted. There is a medium amount of serous drainage noted. The wound margin is flat and intact. There is medium (34-66%) red granulation within the wound bed. There is a medium (34-66%) amount of necrotic tissue within the wound bed including Eschar. The periwound skin appearance did not exhibit: Callus, Crepitus, Excoriation, Induration, Rash, Scarring, Dry/Scaly, Maceration, Atrophie Blanche, Cyanosis, Ecchymosis, Hemosiderin Staining, Mottled, Pallor, Rubor, Erythema. Periwound temperature was noted as No Abnormality. Wound #3 status is Open. Original cause of wound was Gradually Appeared. The wound is located on the Right,Lateral Lower Leg. The wound measures 0.6cm length x 0.3cm width x 0.1cm depth; 0.141cm^2 area and 0.014cm^3 volume. There is no tunneling or undermining noted. There is a none present amount of drainage noted. The wound margin is flat and intact. There is large (67-100%) red granulation within the wound bed. There is a small (1-33%) amount of necrotic tissue within the wound bed including Eschar. Assessment Active Problems ICD-10 L89.154 - Pressure ulcer of sacral region, stage 4 M48.00  - Spinal stenosis, site unspecified R54 - Age-related physical debility G89.4 - Chronic pain syndrome E78.2 - Mixed hyperlipidemia L89.95 - Pressure ulcer of unspecified site, unstageable Jessica [name], [name] ([id number]) Procedures Wound #1 Pre-procedure diagnosis of Wound #1 is a Pressure Ulcer located on the Medial Sacrum . There was a Skin/Subcutaneous Tissue Debridement ([id number]) debridement with total area of 49.05 sq cm performed by Bonnell Public, NP. with the following instrument(s): Curette to remove Viable and Non-Viable tissue/material including Exudate, Fibrin/Slough, and Subcutaneous after achieving pain control using Lidocaine 4% Topical Solution. 1 Specimen was taken by a Swab and sent to the lab per facility protocol.A time out was conducted at 15:50, prior to the start of the procedure. A Minimum amount of bleeding was controlled with Pressure. The procedure was tolerated well with a pain level of 0 throughout and a pain level of 0 following the procedure. Post Debridement Measurements: 10.9cm length x 4.5cm width x 2.4cm depth; 92.457cm^3 volume. Post debridement Stage noted as Category/Stage IV. Character of Wound/Ulcer Post Debridement requires further debridement. Post procedure Diagnosis Wound #1: Same as Pre-Procedure Wound #2 Pre-procedure diagnosis of Wound #2 is a Pressure Ulcer located on the Left Lower Leg . There was a Skin/Subcutaneous Tissue Debridement ([id number]) debridement with total area of 0.3 sq cm performed by Bonnell Public, NP. with the following instrument(s): Curette to remove Viable and Non-Viable tissue/material including Exudate, Fibrin/Slough, and Subcutaneous after achieving pain control using Lidocaine 4% Topical Solution. A time out was conducted at 15:50, prior to the start of the procedure. A  Minimum amount of bleeding was controlled with Pressure. The procedure was tolerated well with a pain level of 0 throughout and a pain level of 0  following the procedure. Post Debridement Measurements: 0.6cm length x 0.5cm width x 0.2cm depth; 0.047cm^3 volume. Post debridement Stage noted as Category/Stage III. Character of Wound/Ulcer Post Debridement requires further debridement. Post procedure Diagnosis Wound #2: Same as Pre-Procedure Plan Wound Cleansing: Wound #1 Medial Sacrum: Clean wound with Normal Saline. Anesthetic (add to Medication List): Wound #1 Medial Sacrum: Topical Lidocaine 4% cream applied to wound bed prior to debridement (In Clinic Only). Wound #2 Left Lower Leg: Topical Lidocaine 4% cream applied to wound bed prior to debridement (In Clinic Only). Wound #3 Right,Lateral Lower Leg: Topical Lidocaine 4% cream applied to wound bed prior to debridement (In Clinic Only). Primary Wound Dressing: Wound #1 Medial Sacrum: Hydrafera Blue - TO WOUND until the wound vac is placed this weekend (Saturday) Wound #2 Left Lower Leg: Santyl Ointment - PLACE ON OPEN WOUND AREAS ON LATERAL LOWER LEGS Wound #3 Right,Lateral Lower Leg: Santyl Ointment - PLACE ON OPEN WOUND AREAS ON LATERAL LOWER LEGS Secondary Dressing: Wound #1 Medial Sacrum: ABD pad - TAPE TO SECURE Dry Gauze Wound #2 Left Lower Leg: Jessica [name], Jessica [name]. ([id number]) Boardered Foam Dressing Wound #3 Right,Lateral Lower Leg: Boardered Foam Dressing Dressing Change Frequency: Wound #1 Medial Sacrum: Three times weekly - Saturday, Tuesday, Thursday Pt is seen in Wound Care Center in Thursdays Wound #2 Left Lower Leg: Change dressing every day. Wound #3 Right,Lateral Lower Leg: Change dressing every day. Follow-up Appointments: Return Appointment in 1 week. Off-Loading: Turn and reposition every 2 hours Additional Orders / Instructions: Increase protein intake. Home Health: Wound #1 Medial Sacrum: Continue Home Health Visits Home Health Nurse may visit PRN to address patient s wound care needs. FACE TO FACE ENCOUNTER: MEDICARE and MEDICAID  PATIENTS: I certify that this patient is under my care and that I had a face-to-face encounter that meets the physician face-to-face encounter requirements with this patient on this date. The encounter with the patient was in whole or in part for the following MEDICAL CONDITION: (primary reason for Home Healthcare) MEDICAL NECESSITY: I certify, that based on my findings, NURSING services are a medically necessary home health service. HOME BOUND STATUS: I certify that my clinical findings support that this patient is homebound (i.e., Due to illness or injury, pt requires aid of supportive devices such as crutches, cane, wheelchairs, walkers, the use of special transportation or the assistance of another person to leave their place of residence. There is a normal inability to leave the home and doing so requires considerable and taxing effort. Other absences are for medical reasons / religious services and are infrequent or of short duration when for other reasons). If current dressing causes regression in wound condition, may D/C ordered dressing product/s and apply Normal Saline Moist Dressing daily until next Wound Healing Center / Other MD appointment. Notify Wound Healing Center of regression in wound condition at [phone]. Please direct any NON-WOUND related issues/requests for orders to patient's Primary Care Physician Wound #2 Left Lower Leg: Continue Home Health Visits Home Health Nurse may visit PRN to address patient s wound care needs. FACE TO FACE ENCOUNTER: MEDICARE and MEDICAID PATIENTS: I certify that this patient is under my care and that I had a face-to-face encounter that meets the physician face-to-face encounter requirements with this patient on this date. The encounter with the patient was in whole or in part  for the following MEDICAL CONDITION: (primary reason for Home Healthcare) MEDICAL NECESSITY: I certify, that based on my findings, NURSING services are a medically  necessary home health service. HOME BOUND STATUS: I certify that my clinical findings support that this patient is homebound (i.e., Due to illness or injury, pt requires aid of supportive devices such as crutches, cane, wheelchairs, walkers, the use of special transportation or the assistance of another person to leave their place of residence. There is a normal inability to leave the home and doing so requires considerable and taxing effort. Other absences are for medical reasons / religious services and are infrequent or of short duration when for other reasons). If current dressing causes regression in wound condition, may D/C ordered dressing product/s and apply Normal Saline Moist Dressing daily until next Wound Healing Center / Other MD appointment. Notify Wound Healing Center of regression in wound condition at [phone]. Please direct any NON-WOUND related issues/requests for orders to patient's Primary Care Physician Wound #3 Right,Lateral Lower Leg: Continue Home Health Visits Home Health Nurse may visit PRN to address patient s wound care needs. FACE TO FACE ENCOUNTER: MEDICARE and MEDICAID PATIENTS: I certify that this patient is under my care and that I had a face-to-face encounter that meets the physician face-to-face encounter requirements with this patient on this date. The encounter with the patient was in whole or in part for the following MEDICAL CONDITION: (primary reason for Home Healthcare) MEDICAL NECESSITY: I certify, that based on my findings, NURSING services are a medically necessary home health service. HOME BOUND STATUS: I certify that my clinical findings support that this patient is homebound (i.e., Due to illness or injury, pt requires aid of supportive devices such as crutches, cane, wheelchairs, walkers, the use of special transportation or the assistance of another person to leave their place of residence. There is a normal inability to leave the home and  doing so requires considerable and taxing effort. Other absences are for medical reasons / religious services and Jessica [name], Jessica [name]. ([id number]) are infrequent or of short duration when for other reasons). If current dressing causes regression in wound condition, may D/C ordered dressing product/s and apply Normal Saline Moist Dressing daily until next Wound Healing Center / Other MD appointment. Notify Wound Healing Center of regression in wound condition at [phone]. Please direct any NON-WOUND related issues/requests for orders to patient's Primary Care Physician Laboratory ordered were: Wound culture routine - sacrum The following medication(s) was prescribed: lidocaine topical 4 % cream 1 1 cream topical was prescribed at facility 1. vac to sacral ulcer Th/S/T 2. santyl to ble daily 3. follow up next week Electronic Signature(s) Signed: 06/10/2017 5:00:11 PM By: Bonnell Public Entered By: Bonnell Public on 06/10/2017 17:00:11 Jessica [name] ([id number]) -------------------------------------------------------------------------------- ROS/PFSH Details [name]. Date of Service: 06/10/2017 3:30 PM Medical Record Number: [id number] Patient Account Number: [id number] Date of Birth/Sex: 07/21/1945 (72 y.o. Female) Treating RN: Ashok Cordia, Debi Primary Care Provider: Annita Brod Other Clinician: Referring Provider: Annita Brod Treating Provider/Extender: Kathreen Cosier in Treatment: 2 Information Obtained From Patient Wound History Do you currently have one or more open woundso Yes How many open wounds do you currently haveo 3 How have you been treating your wound(s) until nowo 3 months Has your wound(s) ever healed and then re-openedo No Have you had any lab work done in the past montho No Have you tested positive for an antibiotic resistant organism (MRSA, VRE)o No Eyes Medical History: Negative for:  Cataracts; Glaucoma; Optic  Neuritis Hematologic/Lymphatic Medical History: Positive for: Anemia; Lymphedema Negative for: Hemophilia; Human Immunodeficiency Virus; Sickle Cell Disease Respiratory Medical History: Negative for: Aspiration; Asthma; Chronic Obstructive Pulmonary Disease (COPD); Pneumothorax; Sleep Apnea; Tuberculosis Cardiovascular Medical History: Positive for: Deep Vein Thrombosis Negative for: Angina; Arrhythmia; Congestive Heart Failure; Coronary Artery Disease; Hypertension; Hypotension; Myocardial Infarction Gastrointestinal Medical History: Negative for: Cirrhosis ; Colitis; Crohnos; Hepatitis A; Hepatitis B; Hepatitis C Endocrine Medical History: Negative for: Type I Diabetes; Type II Diabetes Genitourinary Medical History: Negative for: End Stage Renal Disease Jessica [name], Jessica [name] ([id number]) Immunological Medical History: Negative for: Lupus Erythematosus; Raynaudos; Scleroderma Integumentary (Skin) Medical History: Negative for: History of Burn; History of pressure wounds Musculoskeletal Medical History: Negative for: Gout; Rheumatoid Arthritis; Osteoarthritis; Osteomyelitis Neurologic Medical History: Positive for: Quadriplegia Negative for: Dementia; Neuropathy; Paraplegia; Seizure Disorder Psychiatric Medical History: Negative for: Anorexia/bulimia; Confinement Anxiety Immunizations Pneumococcal Vaccine: Received Pneumococcal Vaccination: Yes Implantable Devices Hospitalization / Surgery History Name of Hospital Purpose of Hospitalization/Surgery Date [facility] UTI and yeats infection with pneumonia 01/15/2017 Family and Social History Cancer: No; Diabetes: No; Heart Disease: No; Hypertension: Yes - Father; Kidney Disease: No; Lung Disease: Yes - Father; Seizures: No; Stroke: No; Thyroid Problems: No; Tuberculosis: No; Never smoker; Marital Status - Married; Alcohol Use: Never; Drug Use: No History; Caffeine Use: Daily; Financial Concerns: No; Food, Clothing or Shelter  Needs: No; Support System Lacking: No; Advanced Directives: No; Patient does not want information on Advanced Directives; Do not resuscitate: No; Living Will: No; Medical Power of Attorney: No Physician Affirmation I have reviewed and agree with the above information. Electronic Signature(s) Signed: 06/10/2017 4:59:29 PM By: Alejandro Mulling Signed: 06/17/2017 1:53:22 PM By: Bonnell Public Entered By: Bonnell Public on 06/10/2017 16:58:55 Jessica [name] ([id number]) -------------------------------------------------------------------------------- SuperBill Details [name]. Date of Service: 06/10/2017 Medical Record Number: [id number] Patient Account Number: [id number] Date of Birth/Sex: 1945-08-06 (72 y.o. Female) Treating RN: Ashok Cordia, Debi Primary Care Provider: Annita Brod Other Clinician: Referring Provider: Annita Brod Treating Provider/Extender: Kathreen Cosier in Treatment: 2 Diagnosis Coding ICD-10 Codes Code Description L89.154 Pressure ulcer of sacral region, stage 4 M48.00 Spinal stenosis, site unspecified R54 Age-related physical debility G89.4 Chronic pain syndrome E78.2 Mixed hyperlipidemia L89.95 Pressure ulcer of unspecified site, unstageable Facility Procedures CPT4 Code: 08657846 Description: 11042 - DEB SUBQ TISSUE 20 SQ CM/< ICD-10 Diagnosis Description L89.154 Pressure ulcer of sacral region, stage 4 L89.95 Pressure ulcer of unspecified site, unstageable Modifier: Quantity: 1 CPT4 Code: 96295284 Description: 11045 - DEB SUBQ TISS EA ADDL 20CM ICD-10 Diagnosis Description L89.154 Pressure ulcer of sacral region, stage 4 L89.95 Pressure ulcer of unspecified site, unstageable Modifier: Quantity: 2 Physician Procedures CPT4 Code: [id number] Description: 11042 - WC PHYS SUBQ TISS 20 SQ CM ICD-10 Diagnosis Description L89.154 Pressure ulcer of sacral region, stage 4 L89.95 Pressure ulcer of unspecified site,  unstageable Modifier: Quantity: 1 CPT4 Code: [id number] Description: 11045 - WC PHYS SUBQ TISS EA ADDL 20 CM ICD-10 Diagnosis Description L89.154 Pressure ulcer of sacral region, stage 4 L89.95 Pressure ulcer of unspecified site, unstageable Modifier: Quantity: 2 Electronic Signature(s) Signed: 06/10/2017 5:00:25 PM By: Bonnell Public Entered By: Bonnell Public on 06/10/2017 17:00:25

## 2017-06-22 NOTE — Progress Notes (Addendum)
AZRA, ABRELL ([id number]) Visit Report for 06/18/2017 Chief Complaint Document Details [name], Jessica [name]. Date of Service: 06/18/2017 2:00 PM Medical Record Number: [id number] Patient Account Number: [id number] Date of Birth/Sex: 1945-10-02 (72 y.o. Female) Treating RN: Curtis Sites Primary Care Provider: Annita Brod Other Clinician: Referring Provider: Annita Brod Treating Provider/Extender: Linwood Dibbles, Dameon Soltis Weeks in Treatment: 3 Information Obtained from: Patient Chief Complaint she is here for evaluation of a sacral ulcer and bilateral lower extremity ulcers Electronic Signature(s) Signed: 06/18/2017 6:07:32 PM By: Lenda Kelp PA-C Entered By: Lenda Kelp on 06/18/2017 14:10:01 Jessica [name] ([id number]) -------------------------------------------------------------------------------- HPI Details [name]. Date of Service: 06/18/2017 2:00 PM Medical Record Number: [id number] Patient Account Number: [id number] Date of Birth/Sex: 1945/07/22 (72 y.o. Female) Treating RN: Curtis Sites Primary Care Provider: Annita Brod Other Clinician: Referring Provider: Annita Brod Treating Provider/Extender: Linwood Dibbles, Carlisa Eble Weeks in Treatment: 3 History of Present Illness HPI Description: 05/27/17-she is here in initial evaluation for a left-sided sacral stage IV pressure ulcer and bilateral lower extremity, lateral aspect, unstageable pressure ulcers. She is accompanied by her husband and her son, who are her primary caregivers. She is bedbound secondary to spinal stenosis. According to her son and husband she was hospitalized from 10/5-11/1 with healthcare associated pneumonia and altered mental status. During her hospitalization she was intubated, extubated on 10/27. She was discharged with Foley catheter and follow up with urology. According to her son and spouse she developed the sacral ulcer during hospitalization. Home health has  been applying Santyl daily. She does have a low air loss mattress and is repositioned every 2-3 hours per her family's report. According to the son and spouse she had an appointment with urology on 12/18 and during that appointment developed discoloration to her bilateral lower extremities which ultimately developed into unstageable pressure ulcers to the lateral aspects of her bilateral lower extremities. There has been no topical treatment applied to these. She continues to have home health. There is no concerns expressed regarding dietary intake, stating she eats 3 meals a day, eating was provided; she is supplemented with boost with protein. 06/03/17-she is here in follow-up evaluation for sacral and bilateral lower extremity ulcers. Plain film x-ray done today reveals no distraction to the sacrum or coccyx, no visible abnormalities. Home health has ordered the negative pressure wound system but it has not been initiated. We will continue with Hydrofera Blue until initiation of negative pressure wound system and continue with Santyl to bilateral lower extremity ulcers. Follow-up next week 06/10/17-she is here in follow-up evaluation for sacral and bilateral lower extremity ulcers. The wound VAC will be available tomorrow per home health. We will initiate wound VAC therapy to the sacral ulcer 3 times weekly (Thursday, Saturday/Sunday, Tuesday). We will continue with Santyl to the lower extremity ulcers. The patient's son is checking into home health therapy over the weekend for [facility] changes, with the understanding that if VAC changes cannot be performed over the weekend he will need to change his mother's appointments to Monday, Wednesday or Friday. The sacral ulcer clinically does not appear infected but there has been a change in the amount of drainage acutely, there is no significant amount of devitalized tissue, there is no malodor. Wound culture was obtained to evaluate for occult infection we  will hold off on antibiotic therapy until sensitivities are resulted. 06/18/17 on evaluation today patient appears to be doing fairly well in my opinion although this is the first time I  have seen this patient she has been previously evaluated by Tacey Ruiz here in our office. She is going to be switching to Fridays to see me due to the Wound VAC schedule being changed on Monday, Wednesday, and Friday. Subsequently she seems to be doing fairly well with the Wound VAC. Her son who was present during evaluation today states that he somewhat stresses of the Wound VAC in making sure that it was functioning appropriately. With that being said everything seems to be working well he knows what settings on the [facility] itself to look at and ensure that it is functioning properly. Overall the wound appears to be nice and clean there is no need for debridement today. She has no discomfort in her bilateral lower extremity ulcers also appear to be improving based on measurements and what this honest tell me about the overall appearance. Electronic Signature(s) Signed: 06/18/2017 6:07:32 PM By: Lenda Kelp PA-C Entered By: Lenda Kelp on 06/18/2017 14:55:43 Jessica [name] ([id number]) -------------------------------------------------------------------------------- Gaynelle Adu TISS Details [name] Date of Service: 06/18/2017 2:00 PM Medical Record Number: [id number] Patient Account Number: [id number] Date of Birth/Sex: [birth year] (72 y.o. Female) Treating RN: Curtis Sites Primary Care Provider: Annita Brod Other Clinician: Referring Provider: Annita Brod Treating Provider/Extender: Linwood Dibbles, Shawne Eskelson Weeks in Treatment: 3 Procedure Performed for: Wound #1 Medial Sacrum Performed By: Physician Trellis Paganini., PA-C Post Procedure Diagnosis Same as Pre-procedure Electronic Signature(s) Signed: 06/18/2017 4:45:57 PM By: Curtis Sites Entered By: Curtis Sites on  06/18/2017 14:46:34 Jessica [name] ([id number]) -------------------------------------------------------------------------------- Physical Exam Details [name]. Date of Service: 06/18/2017 2:00 PM Medical Record Number: [id number] Patient Account Number: [id number] Date of Birth/Sex: 18-Oct-1945 (72 y.o. Female) Treating RN: Curtis Sites Primary Care Provider: Annita Brod Other Clinician: Referring Provider: Annita Brod Treating Provider/Extender: STONE III, Kizzi Overbey Weeks in Treatment: 3 Constitutional Well-nourished and well-hydrated in no acute distress. Respiratory normal breathing without difficulty. clear to auscultation bilaterally. Cardiovascular regular rate and rhythm with normal S1, S2. Psychiatric this patient is able to make decisions and demonstrates good insight into disease process. Alert and Oriented x 3. pleasant and cooperative. Notes Patient's wound shows an excellent granulation bed in general during evaluation today. There does not appear to be any evidence of infection and no significant slough buildup noted in the region of the sacral wound. There does appear to be a little bit of hyper granular tissue along with the 3 o'clock border of the wound and this is not immediately against the wound opening but there's a little good tissue and space in between. I am unsure of exactly why the Wound VAC may be causing this as it really should not be suctioning at this location. Electronic Signature(s) Signed: 06/18/2017 6:07:32 PM By: Lenda Kelp PA-C Entered By: Lenda Kelp on 06/18/2017 14:57:11 Jessica [name] ([id number]) -------------------------------------------------------------------------------- Physician Orders Details [name]. Date of Service: 06/18/2017 2:00 PM Medical Record Number: [id number] Patient Account Number: [id number] Date of Birth/Sex: 03-03-1946 (72 y.o. Female) Treating RN: Curtis Sites Primary Care Provider: Annita Brod Other Clinician: Referring Provider: Annita Brod Treating Provider/Extender: Linwood Dibbles, Finnlee Silvernail Weeks in Treatment: 3 Verbal / Phone Orders: No Diagnosis Coding ICD-10 Coding Code Description L89.154 Pressure ulcer of sacral region, stage 4 M48.00 Spinal stenosis, site unspecified R54 Age-related physical debility G89.4 Chronic pain syndrome E78.2 Mixed hyperlipidemia L89.95 Pressure ulcer of unspecified site, unstageable Wound Cleansing Wound #1 Medial Sacrum o Clean wound  with Normal Saline. Wound #2 Left Lower Leg o Clean wound with Normal Saline. Wound #3 Right,Lateral Lower Leg o Clean wound with Normal Saline. Anesthetic (add to Medication List) Wound #1 Medial Sacrum o Topical Lidocaine 4% cream applied to wound bed prior to debridement (In Clinic Only). Wound #2 Left Lower Leg o Topical Lidocaine 4% cream applied to wound bed prior to debridement (In Clinic Only). Wound #3 Right,Lateral Lower Leg o Topical Lidocaine 4% cream applied to wound bed prior to debridement (In Clinic Only). Primary Wound Dressing Wound #1 Medial Sacrum o Silvercel Non-Adherent - or equal to raised periwound ridge under NPWT drape Wound #2 Left Lower Leg o Santyl Ointment - PLACE ON OPEN WOUND AREAS ON LATERAL LOWER LEGS Wound #3 Right,Lateral Lower Leg o Santyl Ointment - PLACE ON OPEN WOUND AREAS ON LATERAL LOWER LEGS Secondary Dressing Wound #2 Left Lower Leg o Boardered Foam Dressing CYNDE, MENARD ([id number]) Wound #3 Right,Lateral Lower Leg o Boardered Foam Dressing Dressing Change Frequency Wound #1 Medial Sacrum o Three times weekly - Saturday, Tuesday, Thursday Pt is seen in Wound Care Center in Thursdays Wound #2 Left Lower Leg o Change dressing every day. Wound #3 Right,Lateral Lower Leg o Change dressing every day. Follow-up Appointments Wound #1 Medial Sacrum o Return Appointment in 1  week. Wound #2 Left Lower Leg o Return Appointment in 1 week. Wound #3 Right,Lateral Lower Leg o Return Appointment in 1 week. Off-Loading Wound #1 Medial Sacrum o Turn and reposition every 2 hours Wound #2 Left Lower Leg o Turn and reposition every 2 hours Wound #3 Right,Lateral Lower Leg o Turn and reposition every 2 hours Additional Orders / Instructions Wound #1 Medial Sacrum o Increase protein intake. Wound #2 Left Lower Leg o Increase protein intake. Wound #3 Right,Lateral Lower Leg o Increase protein intake. Home Health Wound #1 Medial Sacrum o Continue Home Health Visits - [facility] o Home Health Nurse may visit PRN to address patientos wound care needs. o FACE TO FACE ENCOUNTER: MEDICARE and MEDICAID PATIENTS: I certify that this patient is under my care and that I had a face-to-face encounter that meets the physician face-to-face encounter requirements with this patient on this date. The encounter with the patient was in whole or in part for the following MEDICAL CONDITION: (primary reason for Home Healthcare) MEDICAL NECESSITY: I certify, that based on my findings, NURSING services are a medically necessary home health service. HOME BOUND STATUS: I certify that my clinical findings support that this patient is homebound (i.e., Due to illness or injury, pt requires aid of EVANGELYNN, LOCHRIDGE. ([id number]) supportive devices such as crutches, cane, wheelchairs, walkers, the use of special transportation or the assistance of another person to leave their place of residence. There is a normal inability to leave the home and doing so requires considerable and taxing effort. Other absences are for medical reasons / religious services and are infrequent or of short duration when for other reasons). o If current dressing causes regression in wound condition, may D/C ordered dressing product/s and apply Normal Saline Moist Dressing daily until next Wound  Healing Center / Other MD appointment. Notify Wound Healing Center of regression in wound condition at [phone]. o Please direct any NON-WOUND related issues/requests for orders to patient's Primary Care Physician Wound #2 Left Lower Leg o Continue Home Health Visits - [facility] o Home Health Nurse may visit PRN to address patientos wound care needs. o FACE TO FACE ENCOUNTER: MEDICARE and MEDICAID PATIENTS: I certify  that this patient is under my care and that I had a face-to-face encounter that meets the physician face-to-face encounter requirements with this patient on this date. The encounter with the patient was in whole or in part for the following MEDICAL CONDITION: (primary reason for Home Healthcare) MEDICAL NECESSITY: I certify, that based on my findings, NURSING services are a medically necessary home health service. HOME BOUND STATUS: I certify that my clinical findings support that this patient is homebound (i.e., Due to illness or injury, pt requires aid of supportive devices such as crutches, cane, wheelchairs, walkers, the use of special transportation or the assistance of another person to leave their place of residence. There is a normal inability to leave the home and doing so requires considerable and taxing effort. Other absences are for medical reasons / religious services and are infrequent or of short duration when for other reasons). o If current dressing causes regression in wound condition, may D/C ordered dressing product/s and apply Normal Saline Moist Dressing daily until next Wound Healing Center / Other MD appointment. Notify Wound Healing Center of regression in wound condition at [phone]. o Please direct any NON-WOUND related issues/requests for orders to patient's Primary Care Physician Wound #3 Right,Lateral Lower Leg o Continue Home Health Visits - [facility] o Home Health Nurse may visit PRN to address patientos wound care needs. o  FACE TO FACE ENCOUNTER: MEDICARE and MEDICAID PATIENTS: I certify that this patient is under my care and that I had a face-to-face encounter that meets the physician face-to-face encounter requirements with this patient on this date. The encounter with the patient was in whole or in part for the following MEDICAL CONDITION: (primary reason for Home Healthcare) MEDICAL NECESSITY: I certify, that based on my findings, NURSING services are a medically necessary home health service. HOME BOUND STATUS: I certify that my clinical findings support that this patient is homebound (i.e., Due to illness or injury, pt requires aid of supportive devices such as crutches, cane, wheelchairs, walkers, the use of special transportation or the assistance of another person to leave their place of residence. There is a normal inability to leave the home and doing so requires considerable and taxing effort. Other absences are for medical reasons / religious services and are infrequent or of short duration when for other reasons). o If current dressing causes regression in wound condition, may D/C ordered dressing product/s and apply Normal Saline Moist Dressing daily until next Wound Healing Center / Other MD appointment. Notify Wound Healing Center of regression in wound condition at [phone]. o Please direct any NON-WOUND related issues/requests for orders to patient's Primary Care Physician Negative Pressure Wound Therapy Wound #1 Medial Sacrum o Wound VAC settings at 125/130 mmHg continuous pressure. Use BLACK/GREEN foam to wound cavity. Use WHITE foam to fill any tunnel/s and/or undermining. Change VAC dressing 3 X WEEK. Change canister as indicated when full. Nurse may titrate settings and frequency of dressing changes as clinically indicated. o Home Health Nurse may d/c VAC for s/s of increased infection, significant wound regression, or uncontrolled drainage. Notify Wound Healing Center at  [phone]. o Apply contact layer over base of wound. - to any exposed bone o Number of foam/gauze pieces used in the dressing = - 2 Electronic Signature(s) Jessica BradyOVERMAN, Aubrianna W. ([id number]) Signed: 06/18/2017 5:16:29 PM By: Elliot GurneyWoody, BSN, RN, CWS, Kim RN, BSN Signed: 06/18/2017 6:07:32 PM By: Lenda KelpStone III, Edric Fetterman PA-C Entered By: Elliot GurneyWoody, BSN, RN, CWS, Kim on 06/18/2017 15:32:16 Jessica BradyVERMAN, [place] W. ([id number]) --------------------------------------------------------------------------------  Problem List Details Patient Name: EDDA, OREA. Date of Service: 06/18/2017 2:00 PM Medical Record Number: [id number] Patient Account Number: [id number] Date of Birth/Sex: 16-Dec-1945 (72 y.o. Female) Treating RN: Curtis Sites Primary Care Provider: Annita Brod Other Clinician: Referring Provider: Annita Brod Treating Provider/Extender: Linwood Dibbles, Rian Busche Weeks in Treatment: 3 Active Problems ICD-10 Encounter Code Description Active Date Diagnosis L89.154 Pressure ulcer of sacral region, stage 4 05/27/2017 Yes M48.00 Spinal stenosis, site unspecified 05/27/2017 Yes R54 Age-related physical debility 05/27/2017 Yes G89.4 Chronic pain syndrome 05/27/2017 Yes E78.2 Mixed hyperlipidemia 05/27/2017 Yes L89.95 Pressure ulcer of unspecified site, unstageable 05/27/2017 Yes Inactive Problems Resolved Problems Electronic Signature(s) Signed: 06/18/2017 6:07:32 PM By: Lenda Kelp PA-C Entered By: Lenda Kelp on 06/18/2017 14:09:49 Jessica [name] ([id number]) -------------------------------------------------------------------------------- Progress Note Details [name]. Date of Service: 06/18/2017 2:00 PM Medical Record Number: [id number] Patient Account Number: [id number] Date of Birth/Sex: 05/25/1945 (72 y.o. Female) Treating RN: Curtis Sites Primary Care Provider: Annita Brod Other Clinician: Referring Provider: Annita Brod Treating Provider/Extender: Linwood Dibbles,  Keenen Roessner Weeks in Treatment: 3 Subjective Chief Complaint Information obtained from Patient she is here for evaluation of a sacral ulcer and bilateral lower extremity ulcers History of Present Illness (HPI) 05/27/17-she is here in initial evaluation for a left-sided sacral stage IV pressure ulcer and bilateral lower extremity, lateral aspect, unstageable pressure ulcers. She is accompanied by her husband and her son, who are her primary caregivers. She is bedbound secondary to spinal stenosis. According to her son and husband she was hospitalized from 10/5-11/1 with healthcare associated pneumonia and altered mental status. During her hospitalization she was intubated, extubated on 10/27. She was discharged with Foley catheter and follow up with urology. According to her son and spouse she developed the sacral ulcer during hospitalization. Home health has been applying Santyl daily. She does have a low air loss mattress and is repositioned every 2-3 hours per her family's report. According to the son and spouse she had an appointment with urology on 12/18 and during that appointment developed discoloration to her bilateral lower extremities which ultimately developed into unstageable pressure ulcers to the lateral aspects of her bilateral lower extremities. There has been no topical treatment applied to these. She continues to have home health. There is no concerns expressed regarding dietary intake, stating she eats 3 meals a day, eating was provided; she is supplemented with boost with protein. 06/03/17-she is here in follow-up evaluation for sacral and bilateral lower extremity ulcers. Plain film x-ray done today reveals no distraction to the sacrum or coccyx, no visible abnormalities. Home health has ordered the negative pressure wound system but it has not been initiated. We will continue with Hydrofera Blue until initiation of negative pressure wound system and continue with Santyl to bilateral  lower extremity ulcers. Follow-up next week 06/10/17-she is here in follow-up evaluation for sacral and bilateral lower extremity ulcers. The wound VAC will be available tomorrow per home health. We will initiate wound VAC therapy to the sacral ulcer 3 times weekly (Thursday, Saturday/Sunday, Tuesday). We will continue with Santyl to the lower extremity ulcers. The patient's son is checking into home health therapy over the weekend for [facility] changes, with the understanding that if VAC changes cannot be performed over the weekend he will need to change his mother's appointments to Monday, Wednesday or Friday. The sacral ulcer clinically does not appear infected but there has been a change in the amount of drainage acutely, there is no significant amount  of devitalized tissue, there is no malodor. Wound culture was obtained to evaluate for occult infection we will hold off on antibiotic therapy until sensitivities are resulted. 06/18/17 on evaluation today patient appears to be doing fairly well in my opinion although this is the first time I have seen this patient she has been previously evaluated by Tacey Ruiz here in our office. She is going to be switching to Fridays to see me due to the Wound VAC schedule being changed on Monday, Wednesday, and Friday. Subsequently she seems to be doing fairly well with the Wound VAC. Her son who was present during evaluation today states that he somewhat stresses of the Wound VAC in making sure that it was functioning appropriately. With that being said everything seems to be working well he knows what settings on the [facility] itself to look at and ensure that it is functioning properly. Overall the wound appears to be nice and clean there is no need for debridement today. She has no discomfort in her bilateral lower extremity ulcers also appear to be improving based on measurements and what this honest tell me about the overall appearance. Patient History Information obtained  from Patient. Family History Hypertension - Father, Lung Disease - Father, No family history of Cancer, Diabetes, Heart Disease, Kidney Disease, Seizures, Stroke, Thyroid Problems, Tuberculosis. JOSELINNE, LAWAL ([id number]) Social History Never smoker, Marital Status - Married, Alcohol Use - Never, Drug Use - No History, Caffeine Use - Daily. Medical History Hospitalization/Surgery History - 01/15/2017, [facility], UTI and yeats infection with pneumonia. Review of Systems (ROS) Constitutional Symptoms (General Health) Denies complaints or symptoms of Fever, Chills. Respiratory The patient has no complaints or symptoms. Cardiovascular The patient has no complaints or symptoms. Psychiatric The patient has no complaints or symptoms. Objective Constitutional Well-nourished and well-hydrated in no acute distress. Vitals Time Taken: 2:00 PM, Temperature: 97.8 F, Pulse: 82 bpm, Respiratory Rate: 16 breaths/min, Blood Pressure: 236/71 mmHg. Respiratory normal breathing without difficulty. clear to auscultation bilaterally. Cardiovascular regular rate and rhythm with normal S1, S2. Psychiatric this patient is able to make decisions and demonstrates good insight into disease process. Alert and Oriented x 3. pleasant and cooperative. General Notes: Patient's wound shows an excellent granulation bed in general during evaluation today. There does not appear to be any evidence of infection and no significant slough buildup noted in the region of the sacral wound. There does appear to be a little bit of hyper granular tissue along with the 3 o'clock border of the wound and this is not immediately against the wound opening but there's a little good tissue and space in between. I am unsure of exactly why the Wound VAC may be causing this as it really should not be suctioning at this location. Integumentary (Hair, Skin) Wound #1 status is Open. Original cause of wound was Pressure Injury. The wound  is located on the Medial Sacrum. The wound measures 9.5cm length x 4.6cm width x 2.5cm depth; 34.322cm^2 area and 85.805cm^3 volume. There is bone, muscle, and Fat Layer (Subcutaneous Tissue) Exposed exposed. There is no tunneling noted, however, there is undermining starting at 8:00 and ending at 2:00 with a maximum distance of 2.5cm. There is a large amount of serous drainage noted. The wound margin is distinct with the outline attached to the wound base. There is large (67-100%) red granulation within the wound bed. There is a small (1-33%) amount of necrotic tissue within the wound bed including Adherent Slough. The periwound skin appearance exhibited: Induration.  The periwound skin appearance did not exhibit: Callus, Crepitus, Maring, Shelly W. ([id number]) Excoriation, Rash, Scarring, Dry/Scaly, Maceration, Atrophie Blanche, Cyanosis, Ecchymosis, Hemosiderin Staining, Mottled, Pallor, Rubor, Erythema. Periwound temperature was noted as No Abnormality. The periwound has tenderness on palpation. Wound #2 status is Open. Original cause of wound was Gradually Appeared. The wound is located on the Left Lower Leg. The wound measures 1cm length x 0.5cm width x 0.1cm depth; 0.393cm^2 area and 0.039cm^3 volume. There is no tunneling or undermining noted. There is a medium amount of serous drainage noted. The wound margin is flat and intact. There is medium (34-66%) red granulation within the wound bed. There is a medium (34-66%) amount of necrotic tissue within the wound bed including Eschar. The periwound skin appearance did not exhibit: Callus, Crepitus, Excoriation, Induration, Rash, Scarring, Dry/Scaly, Maceration, Atrophie Blanche, Cyanosis, Ecchymosis, Hemosiderin Staining, Mottled, Pallor, Rubor, Erythema. Periwound temperature was noted as No Abnormality. Wound #3 status is Open. Original cause of wound was Gradually Appeared. The wound is located on the Right,Lateral Lower Leg. The wound  measures 0.8cm length x 0.4cm width x 0.1cm depth; 0.251cm^2 area and 0.025cm^3 volume. There is no tunneling or undermining noted. There is a none present amount of drainage noted. The wound margin is flat and intact. There is medium (34-66%) red granulation within the wound bed. There is a medium (34-66%) amount of necrotic tissue within the wound bed including Eschar. The periwound skin appearance did not exhibit: Callus, Crepitus, Excoriation, Induration, Rash, Scarring, Dry/Scaly, Maceration, Atrophie Blanche, Cyanosis, Ecchymosis, Hemosiderin Staining, Mottled, Pallor, Rubor, Erythema. Periwound temperature was noted as No Abnormality. The periwound has tenderness on palpation. 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 An CHEM CAUT GRANULATION TISS procedure was performed by STONE III, Chantrell Apsey E., PA-C. Post procedure Diagnosis Wound #1: Same as Pre-Procedure Plan Wound Cleansing: Wound #1 Medial Sacrum: Clean wound with Normal Saline. Wound #2 Left Lower Leg: Clean wound with Normal Saline. BEVELY, HACKBART ([id number]) Wound #3 Right,Lateral Lower Leg: Clean wound with Normal Saline. Anesthetic (add to Medication List): Wound #1 Medial Sacrum: Topical Lidocaine 4% cream applied to wound bed prior to debridement (In Clinic Only). Wound #2 Left Lower Leg: Topical Lidocaine 4% cream applied to wound bed prior to debridement (In Clinic Only). Wound #3 Right,Lateral Lower Leg: Topical Lidocaine 4% cream applied to wound bed prior to debridement (In Clinic Only). Primary Wound Dressing: Wound #1 Medial Sacrum: Silvercel Non-Adherent - or  equal to raised periwound ridge under NPWT drape Wound #2 Left Lower Leg: Santyl Ointment - PLACE ON OPEN WOUND AREAS ON LATERAL LOWER LEGS Wound #3 Right,Lateral Lower Leg: Santyl Ointment - PLACE ON OPEN WOUND AREAS ON LATERAL LOWER LEGS Secondary Dressing: Wound #2 Left Lower Leg: Boardered Foam Dressing Wound #3 Right,Lateral Lower Leg: Boardered Foam Dressing Dressing Change Frequency: Wound #1 Medial Sacrum: Three times weekly - Saturday, Tuesday, Thursday Pt is seen in Wound Care Center in Thursdays Wound #2 Left Lower Leg: Change dressing every day. Wound #3 Right,Lateral Lower Leg: Change dressing every day. Follow-up Appointments: Wound #1 Medial Sacrum: Return Appointment in 1 week. Wound #2 Left Lower Leg: Return Appointment in 1 week. Wound #3 Right,Lateral Lower Leg: Return  Appointment in 1 week. Off-Loading: Wound #1 Medial Sacrum: Turn and reposition every 2 hours Wound #2 Left Lower Leg: Turn and reposition every 2 hours Wound #3 Right,Lateral Lower Leg: Turn and reposition every 2 hours Additional Orders / Instructions: Wound #1 Medial Sacrum: Increase protein intake. Wound #2 Left Lower Leg: Increase protein intake. Wound #3 Right,Lateral Lower Leg: Increase protein intake. Home Health: Wound #1 Medial Sacrum: Continue Home Health Visits - [facility] Nurse may visit PRN to address patient s wound care needs. FACE TO FACE ENCOUNTER: MEDICARE and MEDICAID PATIENTS: I certify that this patient is under my care and that I had a face-to-face encounter that meets the physician face-to-face encounter requirements with this patient on this date. The encounter with the patient was in whole or in part for the following MEDICAL CONDITION: (primary reason for Home Healthcare) MEDICAL NECESSITY: I certify, that based on my findings, NURSING services are a medically necessary home health service. HOME BOUND STATUS: I certify that my clinical findings  support that this patient is homebound (i.e., Due to illness or injury, pt requires aid of supportive devices such as crutches, cane, wheelchairs, walkers, the use of special transportation or the assistance of another person to leave their place of residence. There is a normal inability to leave the RUMOR, SUN. ([id number]) home and doing so requires considerable and taxing effort. Other absences are for medical reasons / religious services and are infrequent or of short duration when for other reasons). If current dressing causes regression in wound condition, may D/C ordered dressing product/s and apply Normal Saline Moist Dressing daily until next Wound Healing Center / Other MD appointment. Notify Wound Healing Center of regression in wound condition at [phone]. Please direct any NON-WOUND related issues/requests for orders to patient's Primary Care Physician Wound #2 Left Lower Leg: Continue Home Health Visits - [facility] Nurse may visit PRN to address patient s wound care needs. FACE TO FACE ENCOUNTER: MEDICARE and MEDICAID PATIENTS: I certify that this patient is under my care and that I had a face-to-face encounter that meets the physician face-to-face encounter requirements with this patient on this date. The encounter with the patient was in whole or in part for the following MEDICAL CONDITION: (primary reason for Home Healthcare) MEDICAL NECESSITY: I certify, that based on my findings, NURSING services are a medically necessary home health service. HOME BOUND STATUS: I certify that my clinical findings support that this patient is homebound (i.e., Due to illness or injury, pt requires aid of supportive devices such as crutches, cane, wheelchairs, walkers, the use of special transportation or the assistance of another person to leave their place of residence. There is a normal inability to leave the home and doing so requires considerable and taxing effort. Other  absences are for medical reasons / religious services and are infrequent or of short duration when for other reasons). If current dressing causes regression in wound condition, may D/C ordered dressing product/s and apply Normal Saline Moist Dressing daily until next Wound Healing Center / Other MD appointment. Notify Wound Healing Center of regression in wound condition at [phone]. Please direct any NON-WOUND related issues/requests for orders to patient's Primary Care Physician Wound #3 Right,Lateral Lower Leg: Continue Home Health Visits - [facility] Nurse may visit PRN to address patient s wound care needs. FACE TO FACE ENCOUNTER: MEDICARE and MEDICAID PATIENTS: I certify that this patient is under my care and that I had a face-to-face encounter that  meets the physician face-to-face encounter requirements with this patient on this date. The encounter with the patient was in whole or in part for the following MEDICAL CONDITION: (primary reason for Home Healthcare) MEDICAL NECESSITY: I certify, that based on my findings, NURSING services are a medically necessary home health service. HOME BOUND STATUS: I certify that my clinical findings support that this patient is homebound (i.e., Due to illness or injury, pt requires aid of supportive devices such as crutches, cane, wheelchairs, walkers, the use of special transportation or the assistance of another person to leave their place of residence. There is a normal inability to leave the home and doing so requires considerable and taxing effort. Other absences are for medical reasons / religious services and are infrequent or of short duration when for other reasons). If current dressing causes regression in wound condition, may D/C ordered dressing product/s and apply Normal Saline Moist Dressing daily until next Wound Healing Center / Other MD appointment. Notify Wound Healing Center of regression in wound condition at  [phone]. Please direct any NON-WOUND related issues/requests for orders to patient's Primary Care Physician Negative Pressure Wound Therapy: Wound #1 Medial Sacrum: Wound VAC settings at 125/130 mmHg continuous pressure. Use BLACK/GREEN foam to wound cavity. Use WHITE foam to fill any tunnel/s and/or undermining. Change VAC dressing 3 X WEEK. Change canister as indicated when full. Nurse may titrate settings and frequency of dressing changes as clinically indicated. Home Health Nurse may d/c VAC for s/s of increased infection, significant wound regression, or uncontrolled drainage. Notify Wound Healing Center at [phone]. Apply contact layer over base of wound. - to any exposed bone Number of foam/gauze pieces used in the dressing = - 2 Currently I did actually perform chemical cauterization of the hyper granular ridge along with 3 o'clock border today utilizing one silver nitrate sticks. I explained to the patient and her son that this will likely cause some discoloration of the skin it will be more of a white/gray/black discoloration. Nonetheless I'm hopeful this will help to minimize any additional hyper granulation at the site. I'm unsure of exactly again why this would've occurred as there really should not be any section at the site specifically. I'm gonna recommend some silver alginate to the hyper granular region underneath the draping. Fortunately there's none evidence of infection. We are gonna see were things stand in one weeks time when I see her for reevaluation. MARIANGEL, RINGLEY ([id number]) Please see above for specific wound care orders. We will see patient for re-evaluation in 1 week(s) here in the clinic. If anything worsens or changes patient will contact our office for additional recommendations. Electronic Signature(s) Signed: 06/22/2017 8:16:06 AM By: Lenda Kelp PA-C Previous Signature: 06/18/2017 6:07:32 PM Version By: Lenda Kelp PA-C Entered By: Lenda Kelp on 06/22/2017 08:15:42 Jessica [name] ([id number]) -------------------------------------------------------------------------------- ROS/PFSH Details [name]. Date of Service: 06/18/2017 2:00 PM Medical Record Number: [id number] Patient Account Number: [id number] Date of Birth/Sex: 19-Oct-1945 (72 y.o. Female) Treating RN: Curtis Sites Primary Care Provider: Annita Brod Other Clinician: Referring Provider: Annita Brod Treating Provider/Extender: STONE III, Everline Mahaffy Weeks in Treatment: 3 Information Obtained From Patient Wound History Do you currently have one or more open woundso Yes How many open wounds do you currently haveo 3 How have you been treating your wound(s) until nowo 3 months Has your wound(s) ever healed and then re-openedo No Have you had any lab work done in the past montho No Have you  tested positive for an antibiotic resistant organism (MRSA, VRE)o No Constitutional Symptoms (General Health) Complaints and Symptoms: Negative for: Fever; Chills Eyes Medical History: Negative for: Cataracts; Glaucoma; Optic Neuritis Hematologic/Lymphatic Medical History: Positive for: Anemia; Lymphedema Negative for: Hemophilia; Human Immunodeficiency Virus; Sickle Cell Disease Respiratory Complaints and Symptoms: No Complaints or Symptoms Medical History: Negative for: Aspiration; Asthma; Chronic Obstructive Pulmonary Disease (COPD); Pneumothorax; Sleep Apnea; Tuberculosis Cardiovascular Complaints and Symptoms: No Complaints or Symptoms Medical History: Positive for: Deep Vein Thrombosis Negative for: Angina; Arrhythmia; Congestive Heart Failure; Coronary Artery Disease; Hypertension; Hypotension; Myocardial Infarction Gastrointestinal Medical History: Negative for: Cirrhosis ; Colitis; Crohnos; Hepatitis A; Hepatitis B; Hepatitis C HIRA, TRENT. ([id number]) Endocrine Medical History: Negative for: Type I Diabetes; Type II  Diabetes Genitourinary Medical History: Negative for: End Stage Renal Disease Immunological Medical History: Negative for: Lupus Erythematosus; Raynaudos; Scleroderma Integumentary (Skin) Medical History: Negative for: History of Burn; History of pressure wounds Musculoskeletal Medical History: Negative for: Gout; Rheumatoid Arthritis; Osteoarthritis; Osteomyelitis Neurologic Medical History: Positive for: Quadriplegia Negative for: Dementia; Neuropathy; Paraplegia; Seizure Disorder Psychiatric Complaints and Symptoms: No Complaints or Symptoms Medical History: Negative for: Anorexia/bulimia; Confinement Anxiety Immunizations Pneumococcal Vaccine: Received Pneumococcal Vaccination: Yes Implantable Devices Hospitalization / Surgery History Name of Hospital Purpose of Hospitalization/Surgery Date [facility] UTI and yeats infection with pneumonia 01/15/2017 Family and Social History Cancer: No; Diabetes: No; Heart Disease: No; Hypertension: Yes - Father; Kidney Disease: No; Lung Disease: Yes - Father; Seizures: No; Stroke: No; Thyroid Problems: No; Tuberculosis: No; Never smoker; Marital Status - Married; Alcohol Use: Never; Drug Use: No History; Caffeine Use: Daily; Financial Concerns: No; Food, Clothing or Shelter Needs: No; Support System Lacking: No; Advanced Directives: No; Patient does not want information on Advanced Directives; Do not resuscitate: No; Living Will: No; Medical Power of Attorney: No Physician Affirmation I have reviewed and agree with the above information. HONEY, ZAKARIAN ([id number]) Electronic Signature(s) Signed: 06/18/2017 4:45:57 PM By: Curtis Sites Signed: 06/18/2017 6:07:32 PM By: Lenda Kelp PA-C Entered By: Lenda Kelp on 06/18/2017 14:56:04 Jessica [name] ([id number]) -------------------------------------------------------------------------------- SuperBill Details [name]. Date of Service: 06/18/2017 Medical  Record Number: [id number] Patient Account Number: [id number] Date of Birth/Sex: [birth year] (72 y.o. Female) Treating RN: Curtis Sites Primary Care Provider: Annita Brod Other Clinician: Referring Provider: Annita Brod Treating Provider/Extender: Linwood Dibbles, Aury Scollard Weeks in Treatment: 3 Diagnosis Coding ICD-10 Codes Code Description L89.154 Pressure ulcer of sacral region, stage 4 M48.00 Spinal stenosis, site unspecified R54 Age-related physical debility G89.4 Chronic pain syndrome E78.2 Mixed hyperlipidemia L89.95 Pressure ulcer of unspecified site, unstageable Facility Procedures CPT4 Code: [id number] Description: 17250 - CHEM CAUT GRANULATION TISS ICD-10 Diagnosis Description L89.154 Pressure ulcer of sacral region, stage 4 Modifier: Quantity: 1 Physician Procedures CPT4 Code: [id number] Description: 99213 - WC PHYS LEVEL 3 - EST PT ICD-10 Diagnosis Description L89.154 Pressure ulcer of sacral region, stage 4 M48.00 Spinal stenosis, site unspecified R54 Age-related physical debility G89.4 Chronic pain syndrome Modifier: 25 Quantity: 1 CPT4 Code: 1660630 Description: 17250 - WC PHYS CHEM CAUT GRAN TISSUE ICD-10 Diagnosis Description L89.154 Pressure ulcer of sacral region, stage 4 Modifier: Quantity: 1 Electronic Signature(s) Signed: 06/18/2017 6:07:32 PM By: Lenda Kelp PA-C Entered By: Lenda Kelp on 06/18/2017 14:58:54

## 2017-06-24 NOTE — Progress Notes (Signed)
Jessica [name] ([id number]) Visit Report for 06/18/2017 Arrival Information Details [name], Jessica [name]. Date of Service: 06/18/2017 2:00 PM Medical Record Number: [id number] Patient Account Number: [id number] Date of Birth/Sex: 28-Aug-1945 (72 y.o. Female) Treating RN: Renne Crigler Primary Care Sreshta Cressler: Annita Brod Other Clinician: Referring Brees Hounshell: Annita Brod Treating Jazzlene Huot/Extender: Linwood Dibbles, HOYT Weeks in Treatment: 3 Visit Information History Since Last Visit All ordered tests and consults were completed: No Patient Arrived: Stretcher Added or deleted any medications: No Arrival Time: 14:00 Any new allergies or adverse reactions: No Accompanied By: son Had a fall or experienced change in No Transfer Assistance: Stretcher activities of daily living that may affect Patient Identification Verified: Yes risk of falls: Secondary Verification Process Completed: Yes Signs or symptoms of abuse/neglect since last visito No Patient Has Alerts: Yes Hospitalized since last visit: No Patient Alerts: allergic to zinc Pain Present Now: No Electronic Signature(s) Signed: 06/23/2017 8:28:46 AM By: Renne Crigler Entered By: Renne Crigler on 06/18/2017 14:00:24 Jessica [name] ([id number]) -------------------------------------------------------------------------------- Encounter Discharge Information Details [name]. Date of Service: 06/18/2017 2:00 PM Medical Record Number: [id number] Patient Account Number: [id number] Date of Birth/Sex: 1945-09-05 (72 y.o. Female) Treating RN: Ashok Cordia, Debi Primary Care Law Corsino: Annita Brod Other Clinician: Referring Deanndra Kirley: Annita Brod Treating Claramae Rigdon/Extender: Linwood Dibbles, HOYT Weeks in Treatment: 3 Encounter Discharge Information Items Discharge Pain Level: 0 Discharge Condition: Stable Ambulatory Status: Stretcher Discharge Destination: Home Transportation:  Ambulance Accompanied By: son Schedule Follow-up Appointment: Yes Medication Reconciliation completed and No provided to Patient/Care [employer]: Provided on Clinical Summary of Care: 06/18/2017 Form Type Recipient Paper Patient PO Electronic Signature(s) Signed: 06/21/2017 8:29:01 AM By: Gwenlyn Perking Entered By: Gwenlyn Perking on 06/18/2017 15:30:42 Jessica [name] ([id number]) -------------------------------------------------------------------------------- Lower Extremity Assessment Details [name]. Date of Service: 06/18/2017 2:00 PM Medical Record Number: [id number] Patient Account Number: [id number] Date of Birth/Sex: [birth year] (72 y.o. Female) Treating RN: Renne Crigler Primary Care Teagon Kron: Annita Brod Other Clinician: Referring Christasia Angeletti: Annita Brod Treating Brandun Pinn/Extender: STONE III, HOYT Weeks in Treatment: 3 Edema Assessment Assessed: [Left: No] [Right: No] Edema: [Left: No] [Right: No] Vascular Assessment Claudication: Claudication Assessment [Left:None] [Right:None] Pulses: Dorsalis Pedis Palpable: [Left:Yes] [Right:Yes] Posterior Tibial Extremity colors, hair growth, and conditions: Extremity Color: [Left:Normal] [Right:Normal] Hair Growth on Extremity: [Left:No] [Right:No] Temperature of Extremity: [Left:Warm] [Right:Warm] Capillary Refill: [Left:< 3 seconds] [Right:< 3 seconds] Toe Nail Assessment Left: Right: Thick: No No Discolored: No No Deformed: No No Improper Length and Hygiene: No No Electronic Signature(s) Signed: 06/23/2017 8:28:46 AM By: Renne Crigler Entered By: Renne Crigler on 06/18/2017 14:23:48 Jessica [name] ([id number]) -------------------------------------------------------------------------------- Multi Wound Chart Details [name]. Date of Service: 06/18/2017 2:00 PM Medical Record Number: [id number] Patient Account Number: [id number] Date of Birth/Sex: [birth year]  (72 y.o. Female) Treating RN: Curtis Sites Primary Care Oriah Leinweber: Annita Brod Other Clinician: Referring Harvis Mabus: Annita Brod Treating Dartanyan Deasis/Extender: STONE III, HOYT Weeks in Treatment: 3 Vital Signs Height(in): Pulse(bpm): 82 Weight(lbs): Blood Pressure(mmHg): 236/71 Body Mass Index(BMI): Temperature(F): 97.8 Respiratory Rate 16 (breaths/min): Photos: [1:No Photos] [2:No Photos] [3:No Photos] Wound Location: [1:Sacrum - Medial] [2:Left Lower Leg] [3:Right Lower Leg - Lateral] Wounding Event: [1:Pressure Injury] [2:Gradually Appeared] [3:Gradually Appeared] Primary Etiology: [1:Pressure Ulcer] [2:Pressure Ulcer] [3:Pressure Ulcer] Comorbid History: [1:Anemia, Lymphedema, Deep Anemia, Lymphedema, Deep Anemia, Lymphedema, Deep Vein Thrombosis, Quadriplegia Vein Thrombosis, Quadriplegia Vein Thrombosis, Quadriplegia] Date Acquired: [1:01/15/2017] [2:04/28/2017] [3:04/28/2017] Weeks of Treatment: [1:3] [2:3] [3:3] Wound Status: [1:Open] [2:Open] [3:Open] Measurements L x W x  D [1:9.5x4.6x2.5] [2:1x0.5x0.1] [3:0.8x0.4x0.1] (cm) Area (cm) : [1:34.322] [2:0.393] [3:0.251] Volume (cm) : [1:85.805] [2:0.039] [3:0.025] % Reduction in Area: [1:6.90%] [2:16.60%] [3:11.30%] % Reduction in Volume: [1:-132.80%] [2:58.50%] [3:10.70%] Starting Position 1 [1:8] (o'clock): Ending Position 1 [1:2] (o'clock): Maximum Distance 1 (cm): [1:2.5] Undermining: [1:Yes] [2:No] [3:No] Classification: [1:Category/Stage IV] [2:Category/Stage III] [3:Category/Stage II] Exudate Amount: [1:Large] [2:Medium] [3:None Present] Exudate Type: [1:Serous] [2:Serous] [3:N/A] Exudate Color: [1:amber] [2:amber] [3:N/A] Wound Margin: [1:Distinct, outline attached] [2:Flat and Intact] [3:Flat and Intact] Granulation Amount: [1:Large (67-100%)] [2:Medium (34-66%)] [3:Medium (34-66%)] Granulation Quality: [1:Red] [2:Red] [3:Red] Necrotic Amount: [1:Small (1-33%)] [2:Medium (34-66%)] [3:Medium  (34-66%)] Necrotic Tissue: [1:Adherent Slough] [2:Eschar] [3:Eschar] Exposed Structures: [1:Fat Layer (Subcutaneous Tissue) Exposed: Yes Muscle: Yes Bone: Yes Fascia: No Tendon: No Joint: No] [2:Fascia: No Fat Layer (Subcutaneous Tissue) Exposed: No Tendon: No Muscle: No Joint: No Bone: No] [3:Fascia: No Fat Layer (Subcutaneous Tissue)  Exposed: No Tendon: No Muscle: No Joint: No Bone: No] Epithelialization: None Large (67-100%) Large (67-100%) Periwound Skin Texture: Induration: Yes Excoriation: No Excoriation: No Excoriation: No Induration: No Induration: No Callus: No Callus: No Callus: No Crepitus: No Crepitus: No Crepitus: No Rash: No Rash: No Rash: No Scarring: No Scarring: No Scarring: No Periwound Skin Moisture: Maceration: No Maceration: No Maceration: No Dry/Scaly: No Dry/Scaly: No Dry/Scaly: No Periwound Skin Color: Atrophie Blanche: No Atrophie Blanche: No Atrophie Blanche: No Cyanosis: No Cyanosis: No Cyanosis: No Ecchymosis: No Ecchymosis: No Ecchymosis: No Erythema: No Erythema: No Erythema: No Hemosiderin Staining: No Hemosiderin Staining: No Hemosiderin Staining: No Mottled: No Mottled: No Mottled: No Pallor: No Pallor: No Pallor: No Rubor: No Rubor: No Rubor: No Temperature: No Abnormality No Abnormality No Abnormality Tenderness on Palpation: Yes No Yes Wound Preparation: Ulcer Cleansing: Ulcer Cleansing: Ulcer Cleansing: Rinsed/Irrigated with Saline Rinsed/Irrigated with Saline Rinsed/Irrigated with Saline Topical Anesthetic Applied: Topical Anesthetic Applied: Topical Anesthetic Applied: Other: lidocaine 4% Other: LIDOCAINE 4% Other: LIDOCAINE 4% Treatment Notes Electronic Signature(s) Signed: 06/18/2017 4:45:57 PM By: Curtis Sites Entered By: Curtis Sites on 06/18/2017 14:45:37 Jessica [name] ([id number]) -------------------------------------------------------------------------------- Multi-Disciplinary Care Plan  Details [name]. Date of Service: 06/18/2017 2:00 PM Medical Record Number: [id number] Patient Account Number: [id number] Date of Birth/Sex: [birth year] (72 y.o. Female) Treating RN: Curtis Sites Primary Care Humza Tallerico: Annita Brod Other Clinician: Referring Tamzin Bertling: Annita Brod Treating Darly Fails/Extender: STONE III, HOYT Weeks in Treatment: 3 Active Inactive ` Orientation to the Wound Care Program Nursing Diagnoses: Knowledge deficit related to the wound healing center program Goals: Patient/caregiver will verbalize understanding of the Wound Healing Center Program Date Initiated: 06/03/2017 Target Resolution Date: 06/17/2017 Goal Status: Active Interventions: Provide education on orientation to the wound center Notes: ` Pressure Nursing Diagnoses: Knowledge deficit related to causes and risk factors for pressure ulcer development Knowledge deficit related to management of pressures ulcers Potential for impaired tissue integrity related to pressure, friction, moisture, and shear Goals: Patient will remain free from development of additional pressure ulcers Date Initiated: 06/03/2017 Target Resolution Date: 06/17/2017 Goal Status: Active Patient will remain free of pressure ulcers Date Initiated: 06/03/2017 Target Resolution Date: 06/17/2017 Goal Status: Active Patient/caregiver will verbalize risk factors for pressure ulcer development Date Initiated: 06/03/2017 Target Resolution Date: 06/17/2017 Goal Status: Active Interventions: Assess: immobility, friction, shearing, incontinence upon admission and as needed Assess potential for pressure ulcer upon admission and as needed Notes: ` Wound/Skin Impairment Jessica [name], Jessica [name] ([id number]) Nursing Diagnoses: Impaired tissue integrity Goals: Patient/caregiver will verbalize understanding of skin care regimen Date Initiated: 06/03/2017 Target Resolution Date: 06/17/2017  Goal Status: Active Ulcer/skin  breakdown will have a volume reduction of 30% by week 4 Date Initiated: 06/03/2017 Target Resolution Date: 06/17/2017 Goal Status: Active Interventions: Assess ulceration(s) every visit Treatment Activities: Patient referred to home care : 06/03/2017 Skin care regimen initiated : 06/03/2017 Notes: Electronic Signature(s) Signed: 06/18/2017 4:45:57 PM By: Curtis Sites Entered By: Curtis Sites on 06/18/2017 14:42:29 Jessica [name] ([id number]) -------------------------------------------------------------------------------- Pain Assessment Details [name]. Date of Service: 06/18/2017 2:00 PM Medical Record Number: [id number] Patient Account Number: [id number] Date of Birth/Sex: April 17, 1945 (72 y.o. Female) Treating RN: Renne Crigler Primary Care Jolean Madariaga: Annita Brod Other Clinician: Referring Sareena Odeh: Annita Brod Treating Saundra Gin/Extender: STONE III, HOYT Weeks in Treatment: 3 Active Problems Location of Pain Severity and Description of Pain Patient Has Paino No Site Locations Pain Management and Medication Current Pain Management: Electronic Signature(s) Signed: 06/23/2017 8:28:46 AM By: Renne Crigler Entered By: Renne Crigler on 06/18/2017 14:00:30 Jessica [name] ([id number]) -------------------------------------------------------------------------------- Patient/Caregiver Education Details [name]. Date of Service: 06/18/2017 2:00 PM Medical Record Number: [id number] Patient Account Number: [id number] Date of Birth/Gender: 1946-02-26 (72 y.o. Female) Treating RN: Ashok Cordia, Debi Primary Care Physician: Annita Brod Other Clinician: Referring Physician: Annita Brod Treating Physician/Extender: Skeet Simmer in Treatment: 3 Education Assessment Education Provided To: Patient and Caregiver son Education Topics Provided Wound/Skin Impairment: Handouts: Caring for Your Ulcer, Other: change  dressing as ordered Methods: Demonstration, Explain/Verbal Responses: State content correctly Electronic Signature(s) Signed: 06/18/2017 4:41:33 PM By: Alejandro Mulling Entered By: Alejandro Mulling on 06/18/2017 15:06:58 Jessica [name] ([id number]) -------------------------------------------------------------------------------- Wound Assessment Details [name]. Date of Service: 06/18/2017 2:00 PM Medical Record Number: [id number] Patient Account Number: [id number] Date of Birth/Sex: April 08, 1946 (72 y.o. Female) Treating RN: Renne Crigler Primary Care Leovardo Thoman: Annita Brod Other Clinician: Referring Dawnell Bryant: Annita Brod Treating Yanissa Michalsky/Extender: STONE III, HOYT Weeks in Treatment: 3 Wound Status Wound Number: 1 Primary Pressure Ulcer Etiology: Wound Location: Sacrum - Medial Wound Status: Open Wounding Event: Pressure Injury Comorbid Anemia, Lymphedema, Deep Vein Date Acquired: 01/15/2017 History: Thrombosis, Quadriplegia Weeks Of Treatment: 3 Clustered Wound: No Photos Photo Uploaded By: Renne Crigler on 06/18/2017 16:18:09 Wound Measurements Length: (cm) 9.5 Width: (cm) 4.6 Depth: (cm) 2.5 Area: (cm) 34.322 Volume: (cm) 85.805 % Reduction in Area: 6.9% % Reduction in Volume: -132.8% Epithelialization: None Tunneling: No Undermining: Yes Starting Position (o'clock): 8 Ending Position (o'clock): 2 Maximum Distance: (cm) 2.5 Wound Description Classification: Category/Stage IV Wound Margin: Distinct, outline attached Exudate Amount: Large Exudate Type: Serous Exudate Color: amber Foul Odor After Cleansing: No Slough/Fibrino Yes Wound Bed Granulation Amount: Large (67-100%) Exposed Structure Granulation Quality: Red Fascia Exposed: No Necrotic Amount: Small (1-33%) Fat Layer (Subcutaneous Tissue) Exposed: Yes Necrotic Quality: Adherent Slough Tendon Exposed: No Muscle Exposed: Yes Jessica [name], Jessica [name].  ([id number]) Necrosis of Muscle: No Joint Exposed: No Bone Exposed: Yes Periwound Skin Texture Texture Color No Abnormalities Noted: No No Abnormalities Noted: No Callus: No Atrophie Blanche: No Crepitus: No Cyanosis: No Excoriation: No Ecchymosis: No Induration: Yes Erythema: No Rash: No Hemosiderin Staining: No Scarring: No Mottled: No Pallor: No Moisture Rubor: No No Abnormalities Noted: No Dry / Scaly: No Temperature / Pain Maceration: No Temperature: No Abnormality Tenderness on Palpation: Yes Wound Preparation Ulcer Cleansing: Rinsed/Irrigated with Saline Topical Anesthetic Applied: Other: lidocaine 4%, Treatment Notes Wound #1 (Medial Sacrum) 1. Cleansed with: Clean wound with Normal Saline 2. Anesthetic Topical Lidocaine 4% cream to wound bed prior to debridement 3. Peri-wound Care: Skin Prep  4. Dressing Applied: Other dressing (specify in notes) 8. Negative Pressure Wound Therapy Wound Vac to wound continuously at 160mm/hg pressure Black Foam Number of foam/gauze pieces used in the dressing = Notes silvercel to the right side edge of wound Electronic Signature(s) Signed: 06/23/2017 8:28:46 AM By: Renne Crigler Entered By: Renne Crigler on 06/18/2017 14:22:09 Jessica [name] ([id number]) -------------------------------------------------------------------------------- Wound Assessment Details [name]. Date of Service: 06/18/2017 2:00 PM Medical Record Number: [id number] Patient Account Number: [id number] Date of Birth/Sex: [birth year] (72 y.o. Female) Treating RN: Renne Crigler Primary Care Ceri Mayer: Annita Brod Other Clinician: Referring Madelon Welsch: Annita Brod Treating Torria Fromer/Extender: STONE III, HOYT Weeks in Treatment: 3 Wound Status Wound Number: 2 Primary Pressure Ulcer Etiology: Wound Location: Left Lower Leg Wound Status: Open Wounding Event: Gradually Appeared Comorbid Anemia, Lymphedema, Deep  Vein Date Acquired: 04/28/2017 History: Thrombosis, Quadriplegia Weeks Of Treatment: 3 Clustered Wound: No Photos Photo Uploaded By: Renne Crigler on 06/18/2017 16:18:10 Wound Measurements Length: (cm) 1 Width: (cm) 0.5 Depth: (cm) 0.1 Area: (cm) 0.393 Volume: (cm) 0.039 % Reduction in Area: 16.6% % Reduction in Volume: 58.5% Epithelialization: Large (67-100%) Tunneling: No Undermining: No Wound Description Classification: Category/Stage III Wound Margin: Flat and Intact Exudate Amount: Medium Exudate Type: Serous Exudate Color: amber Foul Odor After Cleansing: No Slough/Fibrino Yes Wound Bed Granulation Amount: Medium (34-66%) Exposed Structure Granulation Quality: Red Fascia Exposed: No Necrotic Amount: Medium (34-66%) Fat Layer (Subcutaneous Tissue) Exposed: No Necrotic Quality: Eschar Tendon Exposed: No Muscle Exposed: No Joint Exposed: No Bone Exposed: No Periwound Skin Texture Jessica [name], [name] ([id number]) Texture Color No Abnormalities Noted: No No Abnormalities Noted: No Callus: No Atrophie Blanche: No Crepitus: No Cyanosis: No Excoriation: No Ecchymosis: No Induration: No Erythema: No Rash: No Hemosiderin Staining: No Scarring: No Mottled: No Pallor: No Moisture Rubor: No No Abnormalities Noted: No Dry / Scaly: No Temperature / Pain Maceration: No Temperature: No Abnormality Wound Preparation Ulcer Cleansing: Rinsed/Irrigated with Saline Topical Anesthetic Applied: Other: LIDOCAINE 4%, Treatment Notes Wound #2 (Left Lower Leg) 1. Cleansed with: Clean wound with Normal Saline 2. Anesthetic Topical Lidocaine 4% cream to wound bed prior to debridement 3. Peri-wound Care: Skin Prep 4. Dressing Applied: Santyl Ointment 5. Secondary Dressing Applied Bordered Foam Dressing Dry Gauze Electronic Signature(s) Signed: 06/23/2017 8:28:46 AM By: Renne Crigler Entered By: Renne Crigler on 06/18/2017 14:22:23 Jessica [name]  ([id number]) -------------------------------------------------------------------------------- Wound Assessment Details [name]. Date of Service: 06/18/2017 2:00 PM Medical Record Number: [id number] Patient Account Number: [id number] Date of Birth/Sex: 1945/07/13 (72 y.o. Female) Treating RN: Renne Crigler Primary Care Cannan Beeck: Annita Brod Other Clinician: Referring Saylor Murry: Annita Brod Treating Marili Vader/Extender: STONE III, HOYT Weeks in Treatment: 3 Wound Status Wound Number: 3 Primary Pressure Ulcer Etiology: Wound Location: Right Lower Leg - Lateral Wound Status: Open Wounding Event: Gradually Appeared Comorbid Anemia, Lymphedema, Deep Vein Date Acquired: 04/28/2017 History: Thrombosis, Quadriplegia Weeks Of Treatment: 3 Clustered Wound: No Photos Photo Uploaded By: Renne Crigler on 06/18/2017 16:18:33 Wound Measurements Length: (cm) 0.8 Width: (cm) 0.4 Depth: (cm) 0.1 Area: (cm) 0.251 Volume: (cm) 0.025 % Reduction in Area: 11.3% % Reduction in Volume: 10.7% Epithelialization: Large (67-100%) Tunneling: No Undermining: No Wound Description Classification: Category/Stage II Wound Margin: Flat and Intact Exudate Amount: None Present Foul Odor After Cleansing: No Slough/Fibrino Yes Wound Bed Granulation Amount: Medium (34-66%) Exposed Structure Granulation Quality: Red Fascia Exposed: No Necrotic Amount: Medium (34-66%) Fat Layer (Subcutaneous Tissue) Exposed: No Necrotic Quality: Eschar Tendon Exposed: No Muscle Exposed: No Joint  Exposed: No Bone Exposed: No Periwound Skin Texture Texture Color No Abnormalities Noted: No No Abnormalities Noted: No Jessica [name], [name] ([id number]) Callus: No Atrophie Blanche: No Crepitus: No Cyanosis: No Excoriation: No Ecchymosis: No Induration: No Erythema: No Rash: No Hemosiderin Staining: No Scarring: No Mottled: No Pallor: No Moisture Rubor: No No Abnormalities Noted:  No Dry / Scaly: No Temperature / Pain Maceration: No Temperature: No Abnormality Tenderness on Palpation: Yes Wound Preparation Ulcer Cleansing: Rinsed/Irrigated with Saline Topical Anesthetic Applied: Other: LIDOCAINE 4%, Treatment Notes Wound #3 (Right, Lateral Lower Leg) 1. Cleansed with: Clean wound with Normal Saline 2. Anesthetic Topical Lidocaine 4% cream to wound bed prior to debridement 3. Peri-wound Care: Skin Prep 4. Dressing Applied: Santyl Ointment 5. Secondary Dressing Applied Bordered Foam Dressing Dry Gauze Electronic Signature(s) Signed: 06/23/2017 8:28:46 AM By: Renne CriglerFlinchum, Cheryl Entered By: Renne CriglerFlinchum, Cheryl on 06/18/2017 14:22:48 Jessica [name], [name] ([id number]) -------------------------------------------------------------------------------- Vitals Details [name], [name] Date of Service: 06/18/2017 2:00 PM Medical Record Number: [id number] Patient Account Number: [id number] Date of Birth/Sex: 1946/02/24 (72 y.o. Female) Treating RN: Renne CriglerFlinchum, Cheryl Primary Care Joshuajames Moehring: Annita BrodASENSO, PHILIP Other Clinician: Referring Misk Galentine: Annita BrodASENSO, PHILIP Treating Nevaya Nagele/Extender: STONE III, HOYT Weeks in Treatment: 3 Vital Signs Time Taken: 14:00 Temperature (F): 97.8 Pulse (bpm): 82 Respiratory Rate (breaths/min): 16 Blood Pressure (mmHg): 236/71 Reference Range: 80 - 120 mg / dl Electronic Signature(s) Signed: 06/23/2017 8:28:46 AM By: Renne CriglerFlinchum, Cheryl Entered By: Renne CriglerFlinchum, Cheryl on 06/18/2017 14:03:13

## 2017-06-25 ENCOUNTER — Ambulatory Visit: Payer: Medicare Other | Admitting: Physician Assistant

## 2017-07-02 ENCOUNTER — Other Ambulatory Visit
Admission: RE | Admit: 2017-07-02 | Discharge: 2017-07-02 | Disposition: A | Discharge status: Home / Self Care | Payer: Medicare Other | Source: Ambulatory Visit | Attending: Physician Assistant | Admitting: Physician Assistant

## 2017-07-02 ENCOUNTER — Encounter: Payer: Medicare Other | Admitting: Physician Assistant

## 2017-07-02 DIAGNOSIS — B999 Unspecified infectious disease: Secondary | ICD-10-CM | POA: Diagnosis present

## 2017-07-02 DIAGNOSIS — L89154 Pressure ulcer of sacral region, stage 4: Secondary | ICD-10-CM | POA: Diagnosis not present

## 2017-07-05 ENCOUNTER — Other Ambulatory Visit: Payer: Self-pay | Admitting: Physician Assistant

## 2017-07-05 DIAGNOSIS — L89154 Pressure ulcer of sacral region, stage 4: Secondary | ICD-10-CM

## 2017-07-05 LAB — AEROBIC CULTURE W GRAM STAIN (SUPERFICIAL SPECIMEN): Gram Stain: NONE SEEN

## 2017-07-05 LAB — AEROBIC CULTURE  (SUPERFICIAL SPECIMEN)

## 2017-07-06 NOTE — Progress Notes (Signed)
Jessica [name], [name] ([id number]) Visit Report for 07/02/2017 Arrival Information Details [name], [name] Date of Service: 07/02/2017 1:00 PM Medical Record Number: [id number] Patient Account Number: [id number] Date of Birth/Sex: November 27, 1945 (72 y.o. F) Treating RN: Renne CriglerFlinchum, Cheryl Primary Care Keath Matera: Annita BrodASENSO, PHILIP Other Clinician: Referring Lessa Huge: Annita BrodASENSO, PHILIP Treating Elvyn Krohn/Extender: STONE III, HOYT Weeks in Treatment: 5 Visit Information History Since Last Visit All ordered tests and consults were completed: No Patient Arrived: Stretcher Added or deleted any medications: No Arrival Time: 12:55 Any new allergies or adverse reactions: No Accompanied By: son Had a fall or experienced change in No Transfer Assistance: Stretcher activities of daily living that may affect Patient Identification Verified: Yes risk of falls: Secondary Verification Process Completed: Yes Signs or symptoms of abuse/neglect since last visito No Patient Has Alerts: Yes Hospitalized since last visit: No Patient Alerts: allergic to zinc Pain Present Now: No Electronic Signature(s) Signed: 07/05/2017 4:23:05 PM By: Renne CriglerFlinchum, Cheryl Entered By: Renne CriglerFlinchum, Cheryl on 07/02/2017 12:56:21 Jessica [name], [name] ([id number]) -------------------------------------------------------------------------------- Encounter Discharge Information Details [name], [name] Date of Service: 07/02/2017 1:00 PM Medical Record Number: [id number] Patient Account Number: [id number] Date of Birth/Sex: November 27, 1945 (72 y.o. F) Treating RN: Phillis HaggisPinkerton, Debi Primary Care Iyahna Obriant: Annita BrodASENSO, PHILIP Other Clinician: Referring Gennesis Hogland: Annita BrodASENSO, PHILIP Treating Shuntell Foody/Extender: Linwood DibblesSTONE III, HOYT Weeks in Treatment: 5 Encounter Discharge Information Items Discharge Pain Level: 0 Discharge Condition: Stable Ambulatory Status: Stretcher Discharge Destination: Home Transportation: Ambulance Accompanied  By: son Schedule Follow-up Appointment: Yes Medication Reconciliation completed and No provided to Patient/Care Luisdavid Hamblin: Provided on Clinical Summary of Care: 07/02/2017 Form Type Recipient Paper Patient PO Electronic Signature(s) Signed: 07/05/2017 10:49:50 AM By: Gwenlyn PerkingMoore, Shelia Previous Signature: 07/02/2017 2:14:38 PM Version By: Alejandro MullingPinkerton, Debra Entered By: Gwenlyn PerkingMoore, Shelia on 07/02/2017 14:25:37 Jessica [name], [name] ([id number]) -------------------------------------------------------------------------------- Lower Extremity Assessment Details [name], [name] Date of Service: 07/02/2017 1:00 PM Medical Record Number: [id number] Patient Account Number: [id number] Date of Birth/Sex: November 27, 1945 (72 y.o. F) Treating RN: Renne CriglerFlinchum, Cheryl Primary Care Cambri Plourde: Annita BrodASENSO, PHILIP Other Clinician: Referring Dathan Attia: Annita BrodASENSO, PHILIP Treating Tiny Chaudhary/Extender: STONE III, HOYT Weeks in Treatment: 5 Edema Assessment Assessed: [Left: No] [Right: No] Edema: [Left: No] [Right: No] Vascular Assessment Claudication: Claudication Assessment [Left:None] [Right:None] Pulses: Dorsalis Pedis Palpable: [Left:Yes] [Right:Yes] Posterior Tibial Extremity colors, hair growth, and conditions: Extremity Color: [Left:Normal] [Right:Normal] Temperature of Extremity: [Left:Warm] [Right:Warm] Capillary Refill: [Left:< 3 seconds] [Right:< 3 seconds] Toe Nail Assessment Left: Right: Thick: No No Discolored: No No Deformed: No No Improper Length and Hygiene: No No Electronic Signature(s) Signed: 07/05/2017 4:23:05 PM By: Renne CriglerFlinchum, Cheryl Entered By: Renne CriglerFlinchum, Cheryl on 07/02/2017 13:16:42 Jessica [name], [name] ([id number]) -------------------------------------------------------------------------------- Multi Wound Chart Details [name], [name] Date of Service: 07/02/2017 1:00 PM Medical Record Number: [id number] Patient Account Number: [id number] Date of Birth/Sex: November 27, 1945  (72 y.o. F) Treating RN: Curtis Sitesorthy, Joanna Primary Care Mayco Walrond: Annita BrodASENSO, PHILIP Other Clinician: Referring Pike Scantlebury: Annita BrodASENSO, PHILIP Treating Pharrah Rottman/Extender: STONE III, HOYT Weeks in Treatment: 5 Vital Signs Height(in): Pulse(bpm): 81 Weight(lbs): Blood Pressure(mmHg): 132/65 Body Mass Index(BMI): Temperature(F): 97.7 Respiratory Rate 16 (breaths/min): Photos: [1:No Photos] [2:No Photos] [3:No Photos] Wound Location: [1:Sacrum - Medial] [2:Left Lower Leg] [3:Right Lower Leg - Lateral] Wounding Event: [1:Pressure Injury] [2:Gradually Appeared] [3:Gradually Appeared] Primary Etiology: [1:Pressure Ulcer] [2:Pressure Ulcer] [3:Pressure Ulcer] Comorbid History: [1:Anemia, Lymphedema, Deep Anemia, Lymphedema, Deep Anemia, Lymphedema, Deep Vein Thrombosis, Quadriplegia Vein Thrombosis, Quadriplegia Vein Thrombosis, Quadriplegia] Date Acquired: [1:01/15/2017] [2:04/28/2017] [3:04/28/2017] Weeks of Treatment: [1:5] [2:5] [3:5] Wound Status: [1:Open] [2:Open] [3:Open] Measurements L  x W x D [1:8.5x6x2.5] [2:1.1x0.5x0.1] [3:0.5x0.4x0.1] (cm) Area (cm) : [1:40.055] [2:0.432] [3:0.157] Volume (cm) : [1:100.138] [2:0.043] [3:0.016] % Reduction in Area: [1:-8.70%] [2:8.30%] [3:44.50%] % Reduction in Volume: [1:-171.70%] [2:54.30%] [3:42.90%] Starting Position 1 [1:7] (o'clock): Ending Position 1 [1:1] (o'clock): Maximum Distance 1 (cm): [1:2] Undermining: [1:Yes] [2:No] [3:N/A] Classification: [1:Category/Stage IV] [2:Category/Stage III] [3:Category/Stage II] Exudate Amount: [1:Large] [2:Medium] [3:None Present] Exudate Type: [1:Serous] [2:Serous] [3:N/A] Exudate Color: [1:amber] [2:amber] [3:N/A] Foul Odor After Cleansing: [1:Yes] [2:No] [3:No] Odor Anticipated Due to [1:No] [2:N/A] [3:N/A] Product Use: Wound Margin: [1:Distinct, outline attached] [2:Flat and Intact] [3:Flat and Intact] Granulation Amount: [1:Large (67-100%)] [2:Medium (34-66%)] [3:Medium (34-66%)] Granulation  Quality: [1:Red] [2:Red] [3:Red] Necrotic Amount: [1:Small (1-33%)] [2:Medium (34-66%)] [3:Medium (34-66%)] Necrotic Tissue: [1:Adherent Slough] [2:Eschar] [3:Eschar] Exposed Structures: [1:Fat Layer (Subcutaneous Tissue) Exposed: Yes Muscle: Yes Bone: Yes] [2:Fascia: No Fat Layer (Subcutaneous Tissue) Exposed: No Tendon: No] [3:Fascia: No Fat Layer (Subcutaneous Tissue) Exposed: No Tendon: No] Fascia: No Muscle: No Muscle: No Tendon: No Joint: No Joint: No Joint: No Bone: No Bone: No Epithelialization: None Large (67-100%) Large (67-100%) Periwound Skin Texture: Induration: Yes Excoriation: No Excoriation: No Excoriation: No Induration: No Induration: No Callus: No Callus: No Callus: No Crepitus: No Crepitus: No Crepitus: No Rash: No Rash: No Rash: No Scarring: No Scarring: No Scarring: No Periwound Skin Moisture: Maceration: No Maceration: No Maceration: No Dry/Scaly: No Dry/Scaly: No Dry/Scaly: No Periwound Skin Color: Atrophie Blanche: No Atrophie Blanche: No Atrophie Blanche: No Cyanosis: No Cyanosis: No Cyanosis: No Ecchymosis: No Ecchymosis: No Ecchymosis: No Erythema: No Erythema: No Erythema: No Hemosiderin Staining: No Hemosiderin Staining: No Hemosiderin Staining: No Mottled: No Mottled: No Mottled: No Pallor: No Pallor: No Pallor: No Rubor: No Rubor: No Rubor: No Temperature: No Abnormality No Abnormality No Abnormality Tenderness on Palpation: Yes No Yes Wound Preparation: Ulcer Cleansing: Ulcer Cleansing: Ulcer Cleansing: Rinsed/Irrigated with Saline Rinsed/Irrigated with Saline Rinsed/Irrigated with Saline Topical Anesthetic Applied: Topical Anesthetic Applied: Topical Anesthetic Applied: Other: lidocaine 4% Other: LIDOCAINE 4% Other: LIDOCAINE 4% Treatment Notes Electronic Signature(s) Signed: 07/02/2017 4:19:12 PM By: Curtis Sites Entered By: Curtis Sites on 07/02/2017 13:37:44 Jessica [name]  ([id number]) -------------------------------------------------------------------------------- Multi-Disciplinary Care Plan Details [name]. Date of Service: 07/02/2017 1:00 PM Medical Record Number: [id number] Patient Account Number: [id number] Date of Birth/Sex: 1945/09/17 (72 y.o. F) Treating RN: Curtis Sites Primary Care Jeziah Kretschmer: Annita Brod Other Clinician: Referring Peter Keyworth: Annita Brod Treating Zarian Colpitts/Extender: STONE III, HOYT Weeks in Treatment: 5 Active Inactive ` Orientation to the Wound Care Program Nursing Diagnoses: Knowledge deficit related to the wound healing center program Goals: Patient/caregiver will verbalize understanding of the Wound Healing Center Program Date Initiated: 06/03/2017 Target Resolution Date: 06/17/2017 Goal Status: Active Interventions: Provide education on orientation to the wound center Notes: ` Pressure Nursing Diagnoses: Knowledge deficit related to causes and risk factors for pressure ulcer development Knowledge deficit related to management of pressures ulcers Potential for impaired tissue integrity related to pressure, friction, moisture, and shear Goals: Patient will remain free from development of additional pressure ulcers Date Initiated: 06/03/2017 Target Resolution Date: 06/17/2017 Goal Status: Active Patient will remain free of pressure ulcers Date Initiated: 06/03/2017 Target Resolution Date: 06/17/2017 Goal Status: Active Patient/caregiver will verbalize risk factors for pressure ulcer development Date Initiated: 06/03/2017 Target Resolution Date: 06/17/2017 Goal Status: Active Interventions: Assess: immobility, friction, shearing, incontinence upon admission and as needed Assess potential for pressure ulcer upon admission and as needed Notes: ` Wound/Skin Impairment Jessica [name], Jessica [name] ([id number]) Nursing Diagnoses: Impaired  tissue integrity Goals: Patient/caregiver will verbalize  understanding of skin care regimen Date Initiated: 06/03/2017 Target Resolution Date: 06/17/2017 Goal Status: Active Ulcer/skin breakdown will have a volume reduction of 30% by week 4 Date Initiated: 06/03/2017 Target Resolution Date: 06/17/2017 Goal Status: Active Interventions: Assess ulceration(s) every visit Treatment Activities: Patient referred to home care : 06/03/2017 Skin care regimen initiated : 06/03/2017 Notes: Electronic Signature(s) Signed: 07/02/2017 4:19:12 PM By: Curtis Sites Entered By: Curtis Sites on 07/02/2017 13:36:43 Jessica [name] ([id number]) -------------------------------------------------------------------------------- Pain Assessment Details [name]. Date of Service: 07/02/2017 1:00 PM Medical Record Number: [id number] Patient Account Number: [id number] Date of Birth/Sex: Sep 28, 1945 (72 y.o. F) Treating RN: Renne Crigler Primary Care Deloris Mittag: Annita Brod Other Clinician: Referring Trachelle Low: Annita Brod Treating Aylen Rambert/Extender: STONE III, HOYT Weeks in Treatment: 5 Active Problems Location of Pain Severity and Description of Pain Patient Has Paino No Site Locations Pain Management and Medication Current Pain Management: Electronic Signature(s) Signed: 07/05/2017 4:23:05 PM By: Renne Crigler Entered By: Renne Crigler on 07/02/2017 12:56:45 Jessica [name] ([id number]) -------------------------------------------------------------------------------- Patient/Caregiver Education Details [name]. Date of Service: 07/02/2017 1:00 PM Medical Record Number: [id number] Patient Account Number: [id number] Date of Birth/Gender: 02/01/1946 (72 y.o. F) Treating RN: Phillis Haggis Primary Care Physician: Annita Brod Other Clinician: Referring Physician: Annita Brod Treating Physician/Extender: Linwood Dibbles, HOYT Weeks in Treatment: 5 Education Assessment Education Provided To: Patient  and Caregiver son Education Topics Provided Wound/Skin Impairment: Handouts: Caring for Your Ulcer, Other: change dressing as ordered Methods: Demonstration, Explain/Verbal Responses: State content correctly Electronic Signature(s) Signed: 07/05/2017 4:28:49 PM By: Alejandro Mulling Entered By: Alejandro Mulling on 07/02/2017 14:15:02 Jessica [name] ([id number]) -------------------------------------------------------------------------------- Wound Assessment Details [name]. Date of Service: 07/02/2017 1:00 PM Medical Record Number: [id number] Patient Account Number: [id number] Date of Birth/Sex: [birth year] (72 y.o. F) Treating RN: Renne Crigler Primary Care Tashema Tiller: Annita Brod Other Clinician: Referring Navy Rothschild: Annita Brod Treating Javares Kaufhold/Extender: STONE III, HOYT Weeks in Treatment: 5 Wound Status Wound Number: 1 Primary Pressure Ulcer Etiology: Wound Location: Sacrum - Medial Wound Status: Open Wounding Event: Pressure Injury Comorbid Anemia, Lymphedema, Deep Vein Date Acquired: 01/15/2017 History: Thrombosis, Quadriplegia Weeks Of Treatment: 5 Clustered Wound: No Wound Measurements Length: (cm) 8.5 Width: (cm) 6 Depth: (cm) 2.5 Area: (cm) 40.055 Volume: (cm) 100.138 % Reduction in Area: -8.7% % Reduction in Volume: -171.7% Epithelialization: None Tunneling: No Undermining: Yes Starting Position (o'clock): 7 Ending Position (o'clock): 1 Maximum Distance: (cm) 2 Wound Description Classification: Category/Stage IV Wound Margin: Distinct, outline attached Exudate Amount: Large Exudate Type: Serous Exudate Color: amber Foul Odor After Cleansing: Yes Due to Product Use: No Slough/Fibrino Yes Wound Bed Granulation Amount: Large (67-100%) Exposed Structure Granulation Quality: Red Fascia Exposed: No Necrotic Amount: Small (1-33%) Fat Layer (Subcutaneous Tissue) Exposed: Yes Necrotic Quality: Adherent Slough Tendon  Exposed: No Muscle Exposed: Yes Necrosis of Muscle: No Joint Exposed: No Bone Exposed: Yes Periwound Skin Texture Texture Color No Abnormalities Noted: No No Abnormalities Noted: No Callus: No Atrophie Blanche: No Crepitus: No Cyanosis: No Excoriation: No Ecchymosis: No Induration: Yes Erythema: No Rash: No Hemosiderin Staining: No Scarring: No Mottled: No MEDEA, DEINES. ([id number]) Moisture Pallor: No No Abnormalities Noted: No Rubor: No Dry / Scaly: No Temperature / Pain Maceration: No Temperature: No Abnormality Tenderness on Palpation: Yes Wound Preparation Ulcer Cleansing: Rinsed/Irrigated with Saline Topical Anesthetic Applied: Other: lidocaine 4%, Treatment Notes Wound #1 (Medial Sacrum) 1. Cleansed with: Clean wound with Normal Saline 2. Anesthetic Topical  Lidocaine 4% cream to wound bed prior to debridement 4. Dressing Applied: Other dressing (specify in notes) 5. Secondary Dressing Applied ABD Pad Dry Gauze 7. Secured with Tape Notes dakins solution wet gauze, drawtex Electronic Signature(s) Signed: 07/05/2017 4:23:05 PM By: Renne Crigler Entered By: Renne Crigler on 07/02/2017 13:15:05 Jessica [name] ([id number]) -------------------------------------------------------------------------------- Wound Assessment Details [name]. Date of Service: 07/02/2017 1:00 PM Medical Record Number: [id number] Patient Account Number: [id number] Date of Birth/Sex: 08/15/1945 (72 y.o. F) Treating RN: Renne Crigler Primary Care Daking Westervelt: Annita Brod Other Clinician: Referring Eliyanna Ault: Annita Brod Treating Icholas Irby/Extender: STONE III, HOYT Weeks in Treatment: 5 Wound Status Wound Number: 2 Primary Pressure Ulcer Etiology: Wound Location: Left Lower Leg Wound Status: Open Wounding Event: Gradually Appeared Comorbid Anemia, Lymphedema, Deep Vein Date Acquired: 04/28/2017 History: Thrombosis, Quadriplegia Weeks  Of Treatment: 5 Clustered Wound: No Wound Measurements Length: (cm) 1.1 Width: (cm) 0.5 Depth: (cm) 0.1 Area: (cm) 0.432 Volume: (cm) 0.043 % Reduction in Area: 8.3% % Reduction in Volume: 54.3% Epithelialization: Large (67-100%) Tunneling: No Undermining: No Wound Description Classification: Category/Stage III Foul Odor Wound Margin: Flat and Intact Slough/Fi Exudate Amount: Medium Exudate Type: Serous Exudate Color: amber After Cleansing: No brino Yes Wound Bed Granulation Amount: Medium (34-66%) Exposed Structure Granulation Quality: Red Fascia Exposed: No Necrotic Amount: Medium (34-66%) Fat Layer (Subcutaneous Tissue) Exposed: No Necrotic Quality: Eschar Tendon Exposed: No Muscle Exposed: No Joint Exposed: No Bone Exposed: No Periwound Skin Texture Texture Color No Abnormalities Noted: No No Abnormalities Noted: No Callus: No Atrophie Blanche: No Crepitus: No Cyanosis: No Excoriation: No Ecchymosis: No Induration: No Erythema: No Rash: No Hemosiderin Staining: No Scarring: No Mottled: No Pallor: No Moisture Rubor: No No Abnormalities Noted: No Dry / Scaly: No Temperature / Pain Maceration: No Temperature: No Abnormality Fitz, Jessenya W. ([id number]) Wound Preparation Ulcer Cleansing: Rinsed/Irrigated with Saline Topical Anesthetic Applied: Other: LIDOCAINE 4%, Treatment Notes Wound #2 (Left Lower Leg) 1. Cleansed with: Clean wound with Normal Saline 2. Anesthetic Topical Lidocaine 4% cream to wound bed prior to debridement 3. Peri-wound Care: Skin Prep 4. Dressing Applied: Santyl Ointment 5. Secondary Dressing Applied Bordered Foam Dressing Electronic Signature(s) Signed: 07/05/2017 4:23:05 PM By: Renne Crigler Entered By: Renne Crigler on 07/02/2017 13:15:29 Jessica [name] ([id number]) -------------------------------------------------------------------------------- Wound Assessment Details [name]. Date of Service: 07/02/2017 1:00 PM Medical Record Number: [id number] Patient Account Number: [id number] Date of Birth/Sex: [birth year] (72 y.o. F) Treating RN: Renne Crigler Primary Care Atisha Hamidi: Annita Brod Other Clinician: Referring Starla Deller: Annita Brod Treating Ellinore Merced/Extender: STONE III, HOYT Weeks in Treatment: 5 Wound Status Wound Number: 3 Primary Pressure Ulcer Etiology: Wound Location: Right Lower Leg - Lateral Wound Status: Open Wounding Event: Gradually Appeared Comorbid Anemia, Lymphedema, Deep Vein Date Acquired: 04/28/2017 History: Thrombosis, Quadriplegia Weeks Of Treatment: 5 Clustered Wound: No Wound Measurements Length: (cm) 0.5 Width: (cm) 0.4 Depth: (cm) 0.1 Area: (cm) 0.157 Volume: (cm) 0.016 % Reduction in Area: 44.5% % Reduction in Volume: 42.9% Epithelialization: Large (67-100%) Wound Description Classification: Category/Stage II Foul Odor Wound Margin: Flat and Intact Slough/Fi Exudate Amount: None Present After Cleansing: No brino Yes Wound Bed Granulation Amount: Medium (34-66%) Exposed Structure Granulation Quality: Red Fascia Exposed: No Necrotic Amount: Medium (34-66%) Fat Layer (Subcutaneous Tissue) Exposed: No Necrotic Quality: Eschar Tendon Exposed: No Muscle Exposed: No Joint Exposed: No Bone Exposed: No Periwound Skin Texture Texture Color No Abnormalities Noted: No No Abnormalities Noted: No Callus: No Atrophie Blanche: No Crepitus: No Cyanosis: No Excoriation:  No Ecchymosis: No Induration: No Erythema: No Rash: No Hemosiderin Staining: No Scarring: No Mottled: No Pallor: No Moisture Rubor: No No Abnormalities Noted: No Dry / Scaly: No Temperature / Pain Maceration: No Temperature: No Abnormality Tenderness on Palpation: Yes Wound Preparation SHULAMIS, WENBERG ([id number]) Ulcer Cleansing: Rinsed/Irrigated with Saline Topical Anesthetic Applied: Other: LIDOCAINE 4%, Treatment  Notes Wound #3 (Right, Lateral Lower Leg) 1. Cleansed with: Clean wound with Normal Saline 2. Anesthetic Topical Lidocaine 4% cream to wound bed prior to debridement 3. Peri-wound Care: Skin Prep 4. Dressing Applied: Santyl Ointment 5. Secondary Dressing Applied Bordered Foam Dressing Electronic Signature(s) Signed: 07/05/2017 4:23:05 PM By: Renne Crigler Entered By: Renne Crigler on 07/02/2017 13:15:59 Jessica [name] ([id number]) -------------------------------------------------------------------------------- Vitals Details [name]. Date of Service: 07/02/2017 1:00 PM Medical Record Number: [id number] Patient Account Number: [id number] Date of Birth/Sex: 1946/04/03 (72 y.o. F) Treating RN: Renne Crigler Primary Care Isabella Roemmich: Annita Brod Other Clinician: Referring Kayda Allers: Annita Brod Treating Liston Thum/Extender: STONE III, HOYT Weeks in Treatment: 5 Vital Signs Time Taken: 12:56 Temperature (F): 97.7 Pulse (bpm): 81 Respiratory Rate (breaths/min): 16 Blood Pressure (mmHg): 132/65 Reference Range: 80 - 120 mg / dl Electronic Signature(s) Signed: 07/05/2017 4:23:05 PM By: Renne Crigler Entered By: Renne Crigler on 07/02/2017 12:57:04

## 2017-07-06 NOTE — Progress Notes (Signed)
Jessica [name] ([id number]) Visit Report for 07/02/2017 Chief Complaint Document Details [name], Jessica [name]. Date of Service: 07/02/2017 1:00 PM Medical Record Number: [id number] Patient Account Number: [id number] Date of Birth/Sex: 1945-06-24 (72 y.o. F) Treating RN: Curtis Sites Primary Care Provider: Annita Brod Other Clinician: Referring Provider: Annita Brod Treating Provider/Extender: Linwood Dibbles, HOYT Weeks in Treatment: 5 Information Obtained from: Patient Chief Complaint she is here for evaluation of a sacral ulcer and bilateral lower extremity ulcers Electronic Signature(s) Signed: 07/04/2017 11:36:05 PM By: Lenda Kelp PA-C Entered By: Lenda Kelp on 07/02/2017 13:11:17 Jessica [name] ([id number]) -------------------------------------------------------------------------------- Debridement Details [name]. Date of Service: 07/02/2017 1:00 PM Medical Record Number: [id number] Patient Account Number: [id number] Date of Birth/Sex: 10/21/1945 (72 y.o. F) Treating RN: Curtis Sites Primary Care Provider: Annita Brod Other Clinician: Referring Provider: Annita Brod Treating Provider/Extender: STONE III, HOYT Weeks in Treatment: 5 Debridement Performed for Wound #1 Medial Sacrum Assessment: Performed By: Physician STONE III, HOYT E., PA-C Debridement Type: Debridement Pre-procedure Verification/Time Yes - 13:33 Out Taken: Start Time: 13:33 Pain Control: Lidocaine 4% Topical Solution Level: Skin/Subcutaneous Tissue/Muscle/Bone Total Area Debrided (L x W): 8.5 (cm) x 6 (cm) = 51 (cm) Tissue and other material Viable, Non-Viable, Bone, Eschar, Fibrin/Slough, Subcutaneous debrided: Instrument: Curette, Forceps, Scissors Specimen: Biopsy, Swab Number of Specimens Taken: 2 Bleeding: Minimum Hemostasis Achieved: Pressure End Time: 13:39 Procedural Pain: 0 Post Procedural Pain: 0 Response to Treatment:  Procedure was tolerated well Post Debridement Measurements of Total Wound Length: (cm) 8.5 Stage: Category/Stage IV Width: (cm) 6 Depth: (cm) 2.6 Volume: (cm) 104.144 Character of Wound/Ulcer Post Improved Debridement: Post Procedure Diagnosis Same as Pre-procedure Electronic Signature(s) Signed: 07/04/2017 11:36:05 PM By: Lenda Kelp PA-C Signed: 07/05/2017 4:46:08 PM By: Curtis Sites Previous Signature: 07/02/2017 4:19:12 PM Version By: Curtis Sites Entered By: Lenda Kelp on 07/02/2017 17:07:34 Jessica [name] ([id number]) -------------------------------------------------------------------------------- HPI Details [name]. Date of Service: 07/02/2017 1:00 PM Medical Record Number: [id number] Patient Account Number: [id number] Date of Birth/Sex: 1945/06/30 (72 y.o. F) Treating RN: Curtis Sites Primary Care Provider: Annita Brod Other Clinician: Referring Provider: Annita Brod Treating Provider/Extender: Linwood Dibbles, HOYT Weeks in Treatment: 5 History of Present Illness HPI Description: 05/27/17-she is here in initial evaluation for a left-sided sacral stage IV pressure ulcer and bilateral lower extremity, lateral aspect, unstageable pressure ulcers. She is accompanied by her husband and her son, who are her primary caregivers. She is bedbound secondary to spinal stenosis. According to her son and husband she was hospitalized from 10/5-11/1 with healthcare associated pneumonia and altered mental status. During her hospitalization she was intubated, extubated on 10/27. She was discharged with Foley catheter and follow up with urology. According to her son and spouse she developed the sacral ulcer during hospitalization. Home health has been applying Santyl daily. She does have a low air loss mattress and is repositioned every 2-3 hours per her family's report. According to the son and spouse she had an appointment with urology on 12/18 and  during that appointment developed discoloration to her bilateral lower extremities which ultimately developed into unstageable pressure ulcers to the lateral aspects of her bilateral lower extremities. There has been no topical treatment applied to these. She continues to have home health. There is no concerns expressed regarding dietary intake, stating she eats 3 meals a day, eating was provided; she is supplemented with boost with protein. 06/03/17-she is here in follow-up evaluation for sacral and bilateral  lower extremity ulcers. Plain film x-ray done today reveals no distraction to the sacrum or coccyx, no visible abnormalities. Home health has ordered the negative pressure wound system but it has not been initiated. We will continue with Hydrofera Blue until initiation of negative pressure wound system and continue with Santyl to bilateral lower extremity ulcers. Follow-up next week 06/10/17-she is here in follow-up evaluation for sacral and bilateral lower extremity ulcers. The wound VAC will be available tomorrow per home health. We will initiate wound VAC therapy to the sacral ulcer 3 times weekly (Thursday, Saturday/Sunday, Tuesday). We will continue with Santyl to the lower extremity ulcers. The patient's [facility] is [facility] with the understanding that if VAC changes cannot be performed over the weekend he will need to change his mother's appointments to Monday, Wednesday or Friday. The sacral ulcer clinically does not appear infected but there has been a change in the amount of drainage acutely, there is no significant amount of devitalized tissue, there is no malodor. Wound culture was obtained to evaluate for occult infection we will hold off on antibiotic therapy until sensitivities are resulted. 06/18/17 on evaluation today patient appears to be doing fairly well in my opinion although this is the first time I have seen this patient  she has been previously evaluated by Tacey RuizLeah here in our office. She is going to be switching to Fridays to see me due to the Wound VAC schedule being changed on Monday, Wednesday, and Friday. Subsequently she seems to be doing fairly well with the Wound VAC. Her son who was present during evaluation today states that he somewhat stresses of the Wound VAC in making sure that it was functioning appropriately. With that being said everything seems to be working well [facility] and ensure that it is functioning properly. Overall the wound appears to be nice and clean there is no need for debridement today. She has no discomfort in her bilateral lower extremity ulcers also appear to be improving based on measurements and what this honest tell me about the overall appearance. 07/02/17 on evaluation today I noted in patients wound bed that was actually an odor that had not previously been noted. Subsequently there was also a small area of bone that was not previously noted during my last evaluation. This did appear to be necrotic and was being somewhat forced out by the body around the region of granulation. She does not have any pain which is good news. Nonetheless the overall appearance of the ulcer is making me concerned for the patient having developed osteomyelitis. Currently she is not been on any antibiotics and the Wound VAC has been doing fairly well in general. With that being said I do not think we need to continue the Wound VAC if she potentially has a bone infection. At least not until it is properly addressed with antibiotics. Electronic Signature(s) Signed: 07/04/2017 11:36:05 PM By: Lenda KelpStone III, Hoyt PA-C Entered By: Lenda KelpStone III, Hoyt on 07/02/2017 17:03:59 Jessica BradyVERMAN, [name] ([id number] Reach) Jessica [name], Jessica MemosPHYLLIS W. ([id number]) -------------------------------------------------------------------------------- Physical Exam Details Patient Name: Jessica BradyVERMAN, Tameya  W. Date of Service: 07/02/2017 1:00 PM Medical Record Number: [id number] Patient Account Number: [id number] Date of Birth/Sex: [birth year] (72 y.o. F) Treating RN: Curtis Sitesorthy, Joanna Primary Care Provider: Annita BrodASENSO, PHILIP Other Clinician: Referring Provider: Annita BrodASENSO, PHILIP Treating Provider/Extender: STONE III, HOYT Weeks in Treatment: 5 Constitutional Obese and well-hydrated in no acute distress. Respiratory normal  breathing without difficulty. Psychiatric this patient is able to make decisions and demonstrates good insight into disease process. Alert and Oriented x 3. pleasant and cooperative. Notes Patient's wound bed does show some good granulation although there was some Slough noted as well. I did actually perform debridement of the wound today to clear away the slough as well as the breeding the necrotic bone which was noted over a small portion centrally in regard to the sacrum. I didn't debride away as well is take a biopsy sample to sin for pathology as well is culture. This should help [place] to be able to determine the best treatment regimen going forward although I am going to start her on something today as well. Electronic Signature(s) Signed: 07/04/2017 11:36:05 PM By: Lenda Kelp PA-C Entered By: Lenda Kelp on 07/02/2017 17:05:10 Jessica [name] ([id number]) -------------------------------------------------------------------------------- Physician Orders Details [name]. Date of Service: 07/02/2017 1:00 PM Medical Record Number: [id number] Patient Account Number: [id number] Date of Birth/Sex: 09-16-1945 (72 y.o. F) Treating RN: Curtis Sites Primary Care Provider: Annita Brod Other Clinician: Referring Provider: Annita Brod Treating Provider/Extender: STONE III, HOYT Weeks in Treatment: 5 Verbal / Phone Orders: No Diagnosis Coding ICD-10 Coding Code Description L89.154 Pressure ulcer of sacral region, stage 4 M48.00 Spinal  stenosis, site unspecified R54 Age-related physical debility G89.4 Chronic pain syndrome E78.2 Mixed hyperlipidemia L89.95 Pressure ulcer of unspecified site, unstageable Wound Cleansing Wound #1 Medial Sacrum o Clean wound with Normal Saline. Wound #2 Left Lower Leg o Clean wound with Normal Saline. Wound #3 Right,Lateral Lower Leg o Clean wound with Normal Saline. Anesthetic (add to Medication List) Wound #1 Medial Sacrum o Topical Lidocaine 4% cream applied to wound bed prior to debridement (In Clinic Only). Wound #2 Left Lower Leg o Topical Lidocaine 4% cream applied to wound bed prior to debridement (In Clinic Only). Wound #3 Right,Lateral Lower Leg o Topical Lidocaine 4% cream applied to wound bed prior to debridement (In Clinic Only). Primary Wound Dressing Wound #1 Medial Sacrum o Other: - Dakin's soaked gauze Wound #2 Left Lower Leg o Santyl Ointment - PLACE ON OPEN WOUND AREAS ON LATERAL LOWER LEGS Wound #3 Right,Lateral Lower Leg o Santyl Ointment - PLACE ON OPEN WOUND AREAS ON LATERAL LOWER LEGS Secondary Dressing Wound #1 Medial Sacrum o ABD pad Jessica [name], [name] ([id number]) o XtraSorb Wound #2 Left Lower Leg o Boardered Foam Dressing Wound #3 Right,Lateral Lower Leg o Boardered Foam Dressing Dressing Change Frequency Wound #1 Medial Sacrum o Change dressing every day. Wound #2 Left Lower Leg o Change dressing every day. Wound #3 Right,Lateral Lower Leg o Change dressing every day. Follow-up Appointments Wound #1 Medial Sacrum o Return Appointment in 1 week. Wound #2 Left Lower Leg o Return Appointment in 1 week. Wound #3 Right,Lateral Lower Leg o Return Appointment in 1 week. Off-Loading Wound #1 Medial Sacrum o Turn and reposition every 2 hours Wound #2 Left Lower Leg o Turn and reposition every 2 hours Wound #3 Right,Lateral Lower Leg o Turn and reposition every 2 hours Additional Orders /  Instructions Wound #1 Medial Sacrum o Increase protein intake. Wound #2 Left Lower Leg o Increase protein intake. Wound #3 Right,Lateral Lower Leg o Increase protein intake. Home Health Wound #1 Medial Sacrum o Continue Home Health Visits - [facility] o Home Health Nurse may visit PRN to address patientos wound care needs. o FACE TO FACE ENCOUNTER: MEDICARE and MEDICAID PATIENTS: I certify that this patient is under my  care and that I had a face-to-face encounter that meets the physician face-to-face encounter requirements with this patient on this date. The encounter with the patient was in whole or in part for the following MEDICAL Jessica [name], Jessica [name]. ([id number]) CONDITION: (primary reason for Home Healthcare) MEDICAL NECESSITY: I certify, that based on my findings, NURSING services are a medically necessary home health service. HOME BOUND STATUS: I certify that my clinical findings support that this patient is homebound (i.e., Due to illness or injury, pt requires aid of supportive devices such as crutches, cane, wheelchairs, walkers, the use of special transportation or the assistance of another person to leave their place of residence. There is a normal inability to leave the home and doing so requires considerable and taxing effort. Other absences are for medical reasons / religious services and are infrequent or of short duration when for other reasons). o If current dressing causes regression in wound condition, may D/C ordered dressing product/s and apply Normal Saline Moist Dressing daily until next Wound Healing Center / Other MD appointment. Notify Wound Healing Center of regression in wound condition at [phone]. o Please direct any NON-WOUND related issues/requests for orders to patient's Primary Care Physician Wound #2 Left Lower Leg o Continue Home Health Visits - [facility] o Home Health Nurse may visit PRN to address patientos wound care needs. o  FACE TO FACE ENCOUNTER: MEDICARE and MEDICAID PATIENTS: I certify that this patient is under my care and that I had a face-to-face encounter that meets the physician face-to-face encounter requirements with this patient on this date. The encounter with the patient was in whole or in part for the following MEDICAL CONDITION: (primary reason for Home Healthcare) MEDICAL NECESSITY: I certify, that based on my findings, NURSING services are a medically necessary home health service. HOME BOUND STATUS: I certify that my clinical findings support that this patient is homebound (i.e., Due to illness or injury, pt requires aid of supportive devices such as crutches, cane, wheelchairs, walkers, the use of special transportation or the assistance of another person to leave their place of residence. There is a normal inability to leave the home and doing so requires considerable and taxing effort. Other absences are for medical reasons / religious services and are infrequent or of short duration when for other reasons). o If current dressing causes regression in wound condition, may D/C ordered dressing product/s and apply Normal Saline Moist Dressing daily until next Wound Healing Center / Other MD appointment. Notify Wound Healing Center of regression in wound condition at [phone]. o Please direct any NON-WOUND related issues/requests for orders to patient's Primary Care Physician Wound #3 Right,Lateral Lower Leg o Continue Home Health Visits - [facility] o Home Health Nurse may visit PRN to address patientos wound care needs. o FACE TO FACE ENCOUNTER: MEDICARE and MEDICAID PATIENTS: I certify that this patient is under my care and that I had a face-to-face encounter that meets the physician face-to-face encounter requirements with this patient on this date. The encounter with the patient was in whole or in part for the following MEDICAL CONDITION: (primary reason for Home Healthcare) MEDICAL  NECESSITY: I certify, that based on my findings, NURSING services are a medically necessary home health service. HOME BOUND STATUS: I certify that my clinical findings support that this patient is homebound (i.e., Due to illness or injury, pt requires aid of supportive devices such as crutches, cane, wheelchairs, walkers, the use of special transportation or the assistance of another person to leave  their place of residence. There is a normal inability to leave the home and doing so requires considerable and taxing effort. Other absences are for medical reasons / religious services and are infrequent or of short duration when for other reasons). o If current dressing causes regression in wound condition, may D/C ordered dressing product/s and apply Normal Saline Moist Dressing daily until next Wound Healing Center / Other MD appointment. Notify Wound Healing Center of regression in wound condition at [phone]. o Please direct any NON-WOUND related issues/requests for orders to patient's Primary Care Physician Negative Pressure Wound Therapy Wound #1 Medial Sacrum o Place NPWT on HOLD. Laboratory o Bacteria identified in Wound by Culture (MICRO) oooo LOINC Code: 6462-6 NETRA, POSTLETHWAIT ([id number]) oooo Convenience Name: Wound culture routine o Tissue Pathology biopsy report (PATH) oooo LOINC Code: [phone] oooo Convenience Name: Tiss Path Bx report Radiology o Magnetic Resonance Imaging (MRI) - pelvis specifically looking at sacrum for osteomyelitis without contrast Patient Medications Allergies: zinc, Ambien Notifications Medication Indication Start End doxycycline hyclate 07/02/2017 DOSE 1 - oral 100 mg capsule - 1 capsule oral taken 2 times a day for 14 days Electronic Signature(s) Signed: 07/02/2017 3:13:51 PM By: Lenda Kelp PA-C Entered By: Lenda Kelp on 07/02/2017 15:13:51 Jessica [name]  ([id number]) -------------------------------------------------------------------------------- Problem List Details [name]. Date of Service: 07/02/2017 1:00 PM Medical Record Number: [id number] Patient Account Number: [id number] Date of Birth/Sex: 08-27-1945 (72 y.o. F) Treating RN: Curtis Sites Primary Care Provider: Annita Brod Other Clinician: Referring Provider: Annita Brod Treating Provider/Extender: Linwood Dibbles, HOYT Weeks in Treatment: 5 Active Problems ICD-10 Impacting Encounter Code Description Active Date Wound Healing Diagnosis L89.154 Pressure ulcer of sacral region, stage 4 05/27/2017 Yes M48.00 Spinal stenosis, site unspecified 05/27/2017 Yes R54 Age-related physical debility 05/27/2017 Yes G89.4 Chronic pain syndrome 05/27/2017 Yes E78.2 Mixed hyperlipidemia 05/27/2017 Yes L89.95 Pressure ulcer of unspecified site, unstageable 05/27/2017 Yes Inactive Problems Resolved Problems Electronic Signature(s) Signed: 07/04/2017 11:36:05 PM By: Lenda Kelp PA-C Entered By: Lenda Kelp on 07/02/2017 13:11:03 Jessica [name] ([id number]) -------------------------------------------------------------------------------- Progress Note Details [name]. Date of Service: 07/02/2017 1:00 PM Medical Record Number: [id number] Patient Account Number: [id number] Date of Birth/Sex: Mar 11, 1946 (72 y.o. F) Treating RN: Curtis Sites Primary Care Provider: Annita Brod Other Clinician: Referring Provider: Annita Brod Treating Provider/Extender: Linwood Dibbles, HOYT Weeks in Treatment: 5 Subjective Chief Complaint Information obtained from Patient she is here for evaluation of a sacral ulcer and bilateral lower extremity ulcers History of Present Illness (HPI) 05/27/17-she is here in initial evaluation for a left-sided sacral stage IV pressure ulcer and bilateral lower extremity, lateral aspect, unstageable pressure ulcers. She is  accompanied by her husband and her son, who are her primary caregivers. She is bedbound secondary to spinal stenosis. According to her son and husband she was hospitalized from 10/5-11/1 with healthcare associated pneumonia and altered mental status. During her hospitalization she was intubated, extubated on 10/27. She was discharged with Foley catheter and follow up with urology. According to her son and spouse she developed the sacral ulcer during hospitalization. Home health has been applying Santyl daily. She does have a low air loss mattress and is repositioned every 2-3 hours per her family's report. According to the son and spouse she had an appointment with urology on 12/18 and during that appointment developed discoloration to her bilateral lower extremities which ultimately developed into unstageable pressure ulcers to the lateral aspects of her bilateral lower extremities.  There has been no topical treatment applied to these. She continues to have home health. There is no concerns expressed regarding dietary intake, stating she eats 3 meals a day, eating was provided; she is supplemented with boost with protein. 06/03/17-she is here in follow-up evaluation for sacral and bilateral lower extremity ulcers. Plain film x-ray done today reveals no distraction to the sacrum or coccyx, no visible abnormalities. Home health has ordered the negative pressure wound system but it has not been initiated. We will continue with Hydrofera Blue until initiation of negative pressure wound system and continue with Santyl to bilateral lower extremity ulcers. Follow-up next week 06/10/17-she is here in follow-up evaluation for sacral and bilateral lower extremity ulcers. The wound VAC will be available tomorrow per home health. We will initiate wound VAC therapy to the sacral ulcer 3 times weekly (Thursday, Saturday/Sunday, Tuesday). We will continue with Santyl to the lower extremity ulcers. The patient's son  is checking into home health therapy over the weekend for [facility] changes, with the understanding that if VAC changes cannot be performed over the weekend he will need to change his mother's appointments to Monday, Wednesday or Friday. The sacral ulcer clinically does not appear infected but there has been a change in the amount of drainage acutely, there is no significant amount of devitalized tissue, there is no malodor. Wound culture was obtained to evaluate for occult infection we will hold off on antibiotic therapy until sensitivities are resulted. 06/18/17 on evaluation today patient appears to be doing fairly well in my opinion although this is the first time I have seen this patient she has been previously evaluated by Tacey Ruiz here in our office. She is going to be switching to Fridays to see me due to the Wound VAC schedule being changed on Monday, Wednesday, and Friday. Subsequently she seems to be doing fairly well with the Wound VAC. Her son who was present during evaluation today states that he somewhat stresses of the Wound VAC in making sure that it was functioning appropriately. With that being said everything seems to be working well he knows what settings on the [facility] itself to look at and ensure that it is functioning properly. Overall the wound appears to be nice and clean there is no need for debridement today. She has no discomfort in her bilateral lower extremity ulcers also appear to be improving based on measurements and what this honest tell me about the overall appearance. 07/02/17 on evaluation today I noted in patients wound bed that was actually an odor that had not previously been noted. Subsequently there was also a small area of bone that was not previously noted during my last evaluation. This did appear to be necrotic and was being somewhat forced out by the body around the region of granulation. She does not have any pain which is good news. Nonetheless the overall appearance  of the ulcer is making me concerned for the patient having developed osteomyelitis. Currently she is not been on any antibiotics and the Wound VAC has been doing fairly well in general. With that being said I do not think we need to continue the Wound VAC if she potentially has a bone infection. At least not until it is properly addressed with antibiotics. Jessica [name], Jessica [name] ([id number]) Patient History Information obtained from Patient. Family History Hypertension - Father, Lung Disease - Father, No family history of Cancer, Diabetes, Heart Disease, Kidney Disease, Seizures, Stroke, Thyroid Problems, Tuberculosis. Social History Never smoker, Marital Status -  Married, Alcohol Use - Never, Drug Use - No History, Caffeine Use - Daily. Medical History Hospitalization/Surgery History - 01/15/2017, [facility], UTI and yeats infection with pneumonia. Review of Systems (ROS) Constitutional Symptoms (General Health) Denies complaints or symptoms of Fever, Chills. Respiratory The patient has no complaints or symptoms. Cardiovascular The patient has no complaints or symptoms. Psychiatric The patient has no complaints or symptoms. Objective Constitutional Obese and well-hydrated in no acute distress. Vitals Time Taken: 12:56 PM, Temperature: 97.7 F, Pulse: 81 bpm, Respiratory Rate: 16 breaths/min, Blood Pressure: 132/65 mmHg. Respiratory normal breathing without difficulty. Psychiatric this patient is able to make decisions and demonstrates good insight into disease process. Alert and Oriented x 3. pleasant and cooperative. General Notes: Patient's wound bed does show some good granulation although there was some Slough noted as well. I did actually perform debridement of the wound today to clear away the slough as well as the breeding the necrotic bone which was noted over a small portion centrally in regard to the sacrum. I didn't debride away as well is take a biopsy sample to sin for  pathology as well is culture. This should help [place] to be able to determine the best treatment regimen going forward although I am going to start her on something today as well. Integumentary (Hair, Skin) Wound #1 status is Open. Original cause of wound was Pressure Injury. The wound is located on the Medial Sacrum. The KADEISHA, BETSCH. ([id number]) wound measures 8.5cm length x 6cm width x 2.5cm depth; 40.055cm^2 area and 100.138cm^3 volume. There is bone, muscle, and Fat Layer (Subcutaneous Tissue) Exposed exposed. There is no tunneling noted, however, there is undermining starting at 7:00 and ending at 1:00 with a maximum distance of 2cm. There is a large amount of serous drainage noted. Foul odor after cleansing was noted. The wound margin is distinct with the outline attached to the wound base. There is large (67- 100%) red granulation within the wound bed. There is a small (1-33%) amount of necrotic tissue within the wound bed including Adherent Slough. The periwound skin appearance exhibited: Induration. The periwound skin appearance did not exhibit: Callus, Crepitus, Excoriation, Rash, Scarring, Dry/Scaly, Maceration, Atrophie Blanche, Cyanosis, Ecchymosis, Hemosiderin Staining, Mottled, Pallor, Rubor, Erythema. Periwound temperature was noted as No Abnormality. The periwound has tenderness on palpation. Wound #2 status is Open. Original cause of wound was Gradually Appeared. The wound is located on the Left Lower Leg. The wound measures 1.1cm length x 0.5cm width x 0.1cm depth; 0.432cm^2 area and 0.043cm^3 volume. There is no tunneling or undermining noted. There is a medium amount of serous drainage noted. The wound margin is flat and intact. There is medium (34-66%) red granulation within the wound bed. There is a medium (34-66%) amount of necrotic tissue within the wound bed including Eschar. The periwound skin appearance did not exhibit: Callus, Crepitus, Excoriation, Induration,  Rash, Scarring, Dry/Scaly, Maceration, Atrophie Blanche, Cyanosis, Ecchymosis, Hemosiderin Staining, Mottled, Pallor, Rubor, Erythema. Periwound temperature was noted as No Abnormality. Wound #3 status is Open. Original cause of wound was Gradually Appeared. The wound is located on the Right,Lateral Lower Leg. The wound measures 0.5cm length x 0.4cm width x 0.1cm depth; 0.157cm^2 area and 0.016cm^3 volume. There is a none present amount of drainage noted. The wound margin is flat and intact. There is medium (34-66%) red granulation within the wound bed. There is a medium (34-66%) amount of necrotic tissue within the wound bed including Eschar. The periwound skin appearance did not exhibit: Callus,  Crepitus, Excoriation, Induration, Rash, Scarring, Dry/Scaly, Maceration, Atrophie Blanche, Cyanosis, Ecchymosis, Hemosiderin Staining, Mottled, Pallor, Rubor, Erythema. Periwound temperature was noted as No Abnormality. The periwound has tenderness on palpation. 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 There was a Skin/Subcutaneous Tissue/Muscle/Bone Debridement ([id number]) debridement with total area of 51 sq cm performed by STONE III, HOYT E., PA-C. with the following instrument(s): Curette, Forceps, and Scissors to remove Viable and Non-Viable tissue/material including Bone, Fibrin/Slough, Eschar, and Subcutaneous after achieving pain control using Lidocaine 4% Topical Solution. 2 specimens were taken by a Swab and Biopsy and sent to the lab per facility protocol.A time out was conducted at 13:33, prior to the start of the procedure. A Minimum amount of  bleeding was controlled with Pressure. The procedure was tolerated well with a pain level of 0 throughout and a pain level of 0 following the procedure. Post Debridement Measurements: 8.5cm length x 6cm width x 2.6cm depth; 104.144cm^3 volume. Post debridement Stage noted as Category/Stage IV. Character of Wound/Ulcer Post Debridement is improved. Jessica [name], Jessica [name] ([id number]) Post procedure Diagnosis Wound #1: Same as Pre-Procedure Plan Wound Cleansing: Wound #1 Medial Sacrum: Clean wound with Normal Saline. Wound #2 Left Lower Leg: Clean wound with Normal Saline. Wound #3 Right,Lateral Lower Leg: Clean wound with Normal Saline. Anesthetic (add to Medication List): Wound #1 Medial Sacrum: Topical Lidocaine 4% cream applied to wound bed prior to debridement (In Clinic Only). Wound #2 Left Lower Leg: Topical Lidocaine 4% cream applied to wound bed prior to debridement (In Clinic Only). Wound #3 Right,Lateral Lower Leg: Topical Lidocaine 4% cream applied to wound bed prior to debridement (In Clinic Only). Primary Wound Dressing: Wound #1 Medial Sacrum: Other: - Dakin's soaked gauze Wound #2 Left Lower Leg: Santyl Ointment - PLACE ON OPEN WOUND AREAS ON LATERAL LOWER LEGS Wound #3 Right,Lateral Lower Leg: Santyl Ointment - PLACE ON OPEN WOUND AREAS ON LATERAL LOWER LEGS Secondary Dressing: Wound #1 Medial Sacrum: ABD pad XtraSorb Wound #2 Left Lower Leg: Boardered Foam Dressing Wound #3 Right,Lateral Lower Leg: Boardered Foam Dressing Dressing Change Frequency: Wound #1 Medial Sacrum: Change dressing every day. Wound #2 Left Lower Leg: Change dressing every day. Wound #3 Right,Lateral Lower Leg: Change dressing every day. Follow-up Appointments: Wound #1 Medial Sacrum: Return Appointment in 1 week. Wound #2 Left Lower Leg: Return Appointment in 1 week. Wound #3 Right,Lateral Lower Leg: Return Appointment in 1 week. Off-Loading: Wound #1 Medial Sacrum: Turn and  reposition every 2 hours Wound #2 Left Lower Leg: Turn and reposition every 2 hours Wound #3 Right,Lateral Lower Leg: Turn and reposition every 2 hours Jessica [name], [name] ([id number]) Additional Orders / Instructions: Wound #1 Medial Sacrum: Increase protein intake. Wound #2 Left Lower Leg: Increase protein intake. Wound #3 Right,Lateral Lower Leg: Increase protein intake. Home Health: Wound #1 Medial Sacrum: Continue Home Health Visits - [facility] Nurse may visit PRN to address patient s wound care needs. FACE TO FACE ENCOUNTER: MEDICARE and MEDICAID PATIENTS: I certify that this patient is under my care and that I had a face-to-face encounter that meets the physician face-to-face encounter requirements with this patient on this date. The encounter with the patient was in whole or  in part for the following MEDICAL CONDITION: (primary reason for Home Healthcare) MEDICAL NECESSITY: I certify, that based on my findings, NURSING services are a medically necessary home health service. HOME BOUND STATUS: I certify that my clinical findings support that this patient is homebound (i.e., Due to illness or injury, pt requires aid of supportive devices such as crutches, cane, wheelchairs, walkers, the use of special transportation or the assistance of another person to leave their place of residence. There is a normal inability to leave the home and doing so requires considerable and taxing effort. Other absences are for medical reasons / religious services and are infrequent or of short duration when for other reasons). If current dressing causes regression in wound condition, may D/C ordered dressing product/s and apply Normal Saline Moist Dressing daily until next Wound Healing Center / Other MD appointment. Notify Wound Healing Center of regression in wound condition at [phone]. Please direct any NON-WOUND related issues/requests for orders to patient's Primary Care  Physician Wound #2 Left Lower Leg: Continue Home Health Visits - [facility] Nurse may visit PRN to address patient s wound care needs. FACE TO FACE ENCOUNTER: MEDICARE and MEDICAID PATIENTS: I certify that this patient is under my care and that I had a face-to-face encounter that meets the physician face-to-face encounter requirements with this patient on this date. The encounter with the patient was in whole or in part for the following MEDICAL CONDITION: (primary reason for Home Healthcare) MEDICAL NECESSITY: I certify, that based on my findings, NURSING services are a medically necessary home health service. HOME BOUND STATUS: I certify that my clinical findings support that this patient is homebound (i.e., Due to illness or injury, pt requires aid of supportive devices such as crutches, cane, wheelchairs, walkers, the use of special transportation or the assistance of another person to leave their place of residence. There is a normal inability to leave the home and doing so requires considerable and taxing effort. Other absences are for medical reasons / religious services and are infrequent or of short duration when for other reasons). If current dressing causes regression in wound condition, may D/C ordered dressing product/s and apply Normal Saline Moist Dressing daily until next Wound Healing Center / Other MD appointment. Notify Wound Healing Center of regression in wound condition at [phone]. Please direct any NON-WOUND related issues/requests for orders to patient's Primary Care Physician Wound #3 Right,Lateral Lower Leg: Continue Home Health Visits - [facility] Nurse may visit PRN to address patient s wound care needs. FACE TO FACE ENCOUNTER: MEDICARE and MEDICAID PATIENTS: I certify that this patient is under my care and that I had a face-to-face encounter that meets the physician face-to-face encounter requirements with this patient on this date.  The encounter with the patient was in whole or in part for the following MEDICAL CONDITION: (primary reason for Home Healthcare) MEDICAL NECESSITY: I certify, that based on my findings, NURSING services are a medically necessary home health service. HOME BOUND STATUS: I certify that my clinical findings support that this patient is homebound (i.e., Due to illness or injury, pt requires aid of supportive devices such as crutches, cane, wheelchairs, walkers, the use of special transportation or the assistance of another person to leave their place of residence. There is a normal inability to leave the home and doing so requires considerable and taxing effort. Other absences are for medical reasons / religious services and are infrequent or of short duration when for other reasons). If  current dressing causes regression in wound condition, may D/C ordered dressing product/s and apply Normal Saline Moist Dressing daily until next Wound Healing Center / Other MD appointment. Notify Wound Healing Center of regression in wound condition at [phone]. Please direct any NON-WOUND related issues/requests for orders to patient's Primary Care Physician Negative Pressure Wound Therapy: Wound #1 Medial Sacrum: Place NPWT on HOLD. Laboratory ordered were: Jessica [name], Jessica [name] ([id number]) Wound culture routine, Tiss Path Bx report Radiology ordered were: Magnetic Resonance Imaging (MRI) - pelvis specifically looking at sacrum for osteomyelitis without contrast The following medication(s) was prescribed: doxycycline hyclate oral 100 mg capsule 1 1 capsule oral taken 2 times a day for 14 days starting 07/02/2017 Patient tolerated the debridement today without complication and post debridement the wound appeared to be better in regard to the necrotic bone which was noted in the central portion of the wound. I felt like I was able to clean away the necrotic portion of the bone down to a good surface. With that  being said though the patient appears to have a better surface I still think that the necrotic bone is likely indication of obvious osteomyelitis and she's going to require treatment in that regard. The entire portion of the wound however was debrided although there was only a smaller one by one sitting meter portion of necrotic bone noted centrally that was debrided away. I'm going to suggest that we switch the dressing to Dakin's soaked Gauze packing and put the Wound VAC on hold for the next week. I'm also going to order an MRI along with sending the bone culture as well is the bone pathology. Hopefully this will help to better direct treatment although I am going to also go ahead and prescribe the doxycycline for the patient starting out today. Patient son is in agreement with the plan. We are gonna see were things stand in one weeks time when we see her for reevaluation. Hopefully she will be doing better in regard to the odor and bone exposure at that point. Have you see them or I may take longer than that to obtain to know the extent of the possible osteomyelitis although we should have the pathology back by that time. Please see above for specific wound care orders. We will see patient for re-evaluation in 1 week(s) here in the clinic. If anything worsens or changes patient will contact our office for additional recommendations. Electronic Signature(s) Signed: 07/04/2017 11:36:05 PM By: Lenda Kelp PA-C Entered By: Lenda Kelp on 07/02/2017 17:09:11 Jessica [name] ([id number]) -------------------------------------------------------------------------------- ROS/PFSH Details [name]. Date of Service: 07/02/2017 1:00 PM Medical Record Number: [id number] Patient Account Number: [id number] Date of Birth/Sex: [birth year] (72 y.o. F) Treating RN: Curtis Sites Primary Care Provider: Annita Brod Other Clinician: Referring Provider: Annita Brod Treating  Provider/Extender: STONE III, HOYT Weeks in Treatment: 5 Information Obtained From Patient Wound History Do you currently have one or more open woundso Yes How many open wounds do you currently haveo 3 How have you been treating your wound(s) until nowo 3 months Has your wound(s) ever healed and then re-openedo No Have you had any lab work done in the past montho No Have you tested positive for an antibiotic resistant organism (MRSA, VRE)o No Constitutional Symptoms (General Health) Complaints and Symptoms: Negative for: Fever; Chills Eyes Medical History: Negative for: Cataracts; Glaucoma; Optic Neuritis Hematologic/Lymphatic Medical History: Positive for: Anemia; Lymphedema Negative for: Hemophilia; Human Immunodeficiency Virus; Sickle Cell Disease Respiratory Complaints and  Symptoms: No Complaints or Symptoms Medical History: Negative for: Aspiration; Asthma; Chronic Obstructive Pulmonary Disease (COPD); Pneumothorax; Sleep Apnea; Tuberculosis Cardiovascular Complaints and Symptoms: No Complaints or Symptoms Medical History: Positive for: Deep Vein Thrombosis Negative for: Angina; Arrhythmia; Congestive Heart Failure; Coronary Artery Disease; Hypertension; Hypotension; Myocardial Infarction Gastrointestinal Medical History: Negative for: Cirrhosis ; Colitis; Crohnos; Hepatitis A; Hepatitis B; Hepatitis C Jessica [name], PARISEAU. ([id number]) Endocrine Medical History: Negative for: Type I Diabetes; Type II Diabetes Genitourinary Medical History: Negative for: End Stage Renal Disease Immunological Medical History: Negative for: Lupus Erythematosus; Raynaudos; Scleroderma Integumentary (Skin) Medical History: Negative for: History of Burn; History of pressure wounds Musculoskeletal Medical History: Negative for: Gout; Rheumatoid Arthritis; Osteoarthritis; Osteomyelitis Neurologic Medical History: Positive for: Quadriplegia Negative for: Dementia; Neuropathy;  Paraplegia; Seizure Disorder Psychiatric Complaints and Symptoms: No Complaints or Symptoms Medical History: Negative for: Anorexia/bulimia; Confinement Anxiety Immunizations Pneumococcal Vaccine: Received Pneumococcal Vaccination: Yes Implantable Devices Hospitalization / Surgery History Name of Hospital Purpose of Hospitalization/Surgery Date [facility] UTI and yeats infection with pneumonia 01/15/2017 Family and Social History Cancer: No; Diabetes: No; Heart Disease: No; Hypertension: Yes - Father; Kidney Disease: No; Lung Disease: Yes - Father; Seizures: No; Stroke: No; Thyroid Problems: No; Tuberculosis: No; Never smoker; Marital Status - Married; Alcohol Use: Never; Drug Use: No History; Caffeine Use: Daily; Financial Concerns: No; Food, Clothing or Shelter Needs: No; Support System Lacking: No; Advanced Directives: No; Patient does not want information on Advanced Directives; Do not resuscitate: No; Living Will: No; Medical Power of Attorney: No Physician Affirmation I have reviewed and agree with the above information. Jessica [name], Jessica [name] ([id number]) Electronic Signature(s) Signed: 07/04/2017 11:36:05 PM By: Lenda Kelp PA-C Signed: 07/05/2017 4:46:08 PM By: Curtis Sites Entered By: Lenda Kelp on 07/02/2017 17:04:41 Jessica [name] ([id number]) -------------------------------------------------------------------------------- SuperBill Details [name]. Date of Service: 07/02/2017 Medical Record Number: [id number] Patient Account Number: [id number] Date of Birth/Sex: 09-22-1945 (72 y.o. F) Treating RN: Curtis Sites Primary Care Provider: Annita Brod Other Clinician: Referring Provider: Annita Brod Treating Provider/Extender: Linwood Dibbles, HOYT Weeks in Treatment: 5 Diagnosis Coding ICD-10 Codes Code Description L89.154 Pressure ulcer of sacral region, stage 4 L89.154 Spinal stenosis, site unspecified L89.154 Age-related physical  debility L89.154 Chronic pain syndrome L89.154 Mixed hyperlipidemia L89.154 Pressure ulcer of unspecified site, unstageable Facility Procedures CPT4 Code: [id number] Description: 11044 - DEB BONE 20 SQ CM/< ICD-10 Diagnosis Description L89.154 Pressure ulcer of sacral region, stage 4 Modifier: Quantity: 1 CPT4 Code: [id number] Description: 11047 - DEB BONE EA ADDL 20 SQ CM/< ICD-10 Diagnosis Description L89.154 Pressure ulcer of sacral region, stage 4 Modifier: Quantity: 2 Physician Procedures CPT4: Description Modifier Quantity Code [id number] 99214 - WC PHYS LEVEL 4 - EST PT 25 1 ICD-10 Diagnosis Description L89.154 Pressure ulcer of sacral region, stage 4 M48.00 Spinal stenosis, site unspecified R54 Age-related physical debility G89.4 Chronic  pain syndrome CPT4: 6606301 Debridement; bone (includes epidermis, dermis, subQ tissue, muscle and/or fascia, if 1 performed) 1st 20 sqcm or less ICD-10 Diagnosis Description L89.154 Pressure ulcer of sacral region, stage 4 CPT4: 6010932 Excisional Debridement, Bone (includes epidermis, dermis, subcutaneous tissue, muscle 2 and/or fascia, if performed); each additional 20 sqcm, or part thereof ICD-10 Diagnosis Description L89.154 Pressure ulcer of sacral region, stage 4  Jessica [name], Jessica [name] ([id number]) Electronic Signature(s) Signed: 07/04/2017 11:36:05 PM By: Lenda Kelp PA-C Entered By: Lenda Kelp on 07/02/2017 17:09:49

## 2017-07-09 ENCOUNTER — Encounter: Payer: Medicare Other | Admitting: Physician Assistant

## 2017-07-09 DIAGNOSIS — L89154 Pressure ulcer of sacral region, stage 4: Secondary | ICD-10-CM | POA: Diagnosis not present

## 2017-07-11 NOTE — Progress Notes (Signed)
MEKAYLA, SOMAN ([id number]) Visit Report for 07/09/2017 Chief Complaint Document Details [name], Jessica [name]. Date of Service: 07/09/2017 2:15 PM Medical Record Number: [id number] Patient Account Number: [id number] Date of Birth/Sex: Apr 18, 1945 (72 y.o. F) Treating RN: Curtis Sites Primary Care Provider: Annita Brod Other Clinician: Referring Provider: Annita Brod Treating Provider/Extender: Linwood Dibbles, HOYT Weeks in Treatment: 6 Information Obtained from: Patient Chief Complaint she is here for evaluation of a sacral ulcer and bilateral lower extremity ulcers Electronic Signature(s) Signed: 07/09/2017 6:18:22 PM By: Lenda Kelp PA-C Entered By: Lenda Kelp on 07/09/2017 14:06:08 [name] ([id number]) -------------------------------------------------------------------------------- HPI Details Patient Name: [name]. Date of Service: 07/09/2017 2:15 PM Medical Record Number: [id number] Patient Account Number: [id number] Date of Birth/Sex: [birth year] (72 y.o. F) Treating RN: Curtis Sites Primary Care Provider: Annita Brod Other Clinician: Referring Provider: Annita Brod Treating Provider/Extender: Linwood Dibbles, HOYT Weeks in Treatment: 6 History of Present Illness HPI Description: 05/27/17-she is here in initial evaluation for a left-sided sacral stage IV pressure ulcer and bilateral lower extremity, lateral aspect, unstageable pressure ulcers. She is accompanied by her husband and her son, who are her primary caregivers. She is bedbound secondary to spinal stenosis. According to her son and husband she was hospitalized from 10/5-11/1 with healthcare associated pneumonia and altered mental status. During her hospitalization she was intubated, extubated on 10/27. She was discharged with Foley catheter and follow up with urology. According to her son and spouse she developed the sacral ulcer during hospitalization. Home health has been  applying Santyl daily. She does have a low air loss mattress and is repositioned every 2-3 hours per her family's report. According to the son and spouse she had an appointment with urology on 12/18 and during that appointment developed discoloration to her bilateral lower extremities which ultimately developed into unstageable pressure ulcers to the lateral aspects of her bilateral lower extremities. There has been no topical treatment applied to these. She continues to have home health. There is no concerns expressed regarding dietary intake, stating she eats 3 meals a day, eating was provided; she is supplemented with boost with protein. 06/03/17-she is here in follow-up evaluation for sacral and bilateral lower extremity ulcers. Plain film x-ray done today reveals no distraction to the sacrum or coccyx, no visible abnormalities. Home health has ordered the negative pressure wound system but it has not been initiated. We will continue with Hydrofera Blue until initiation of negative pressure wound system and continue with Santyl to bilateral lower extremity ulcers. Follow-up next week 06/10/17-she is here in follow-up evaluation for sacral and bilateral lower extremity ulcers. The wound VAC will be available tomorrow per home health. We will initiate wound VAC therapy to the sacral ulcer 3 times weekly (Thursday, Saturday/Sunday, Tuesday). We will continue with Santyl to the lower extremity ulcers. The patient's son is checking into home health therapy over the weekend for [facility] changes, with the understanding that if VAC changes cannot be performed over the weekend he will need to change his mother's appointments to Monday, Wednesday or Friday. The sacral ulcer clinically does not appear infected but there has been a change in the amount of drainage acutely, there is no significant amount of devitalized tissue, there is no malodor. Wound culture was obtained to evaluate for occult infection we will  hold off on antibiotic therapy until sensitivities are resulted. 06/18/17 on evaluation today patient appears to be doing fairly well in my opinion although this is the first time I  have seen this patient she has been previously evaluated by Tacey Ruiz here in our office. She is going to be switching to Fridays to see me due to the Wound VAC schedule being changed on Monday, Wednesday, and Friday. Subsequently she seems to be doing fairly well with the Wound VAC. Her son who was present during evaluation today states that he somewhat stresses of the Wound VAC in making sure that it was functioning appropriately. With that being said everything seems to be working well he knows what settings on the [facility] itself to look at and ensure that it is functioning properly. Overall the wound appears to be nice and clean there is no need for debridement today. She has no discomfort in her bilateral lower extremity ulcers also appear to be improving based on measurements and what this honest tell me about the overall appearance. 07/02/17 on evaluation today I noted in patients wound bed that was actually an odor that had not previously been noted. Subsequently there was also a small area of bone that was not previously noted during my last evaluation. This did appear to be necrotic and was being somewhat forced out by the body around the region of granulation. She does not have any pain which is good news. Nonetheless the overall appearance of the ulcer is making me concerned for the patient having developed osteomyelitis. Currently she is not been on any antibiotics and the Wound VAC has been doing fairly well in general. With that being said I do not think we need to continue the Wound VAC if she potentially has a bone infection. At least not until it is properly addressed with antibiotics. 07/09/17 on evaluation today patient appears to actually be doing very well in regard to her sacral ulcer compared to last  week. There is actually no exposed bone at this point. 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 With that being said still I do believe she needs to have a referral to infectious disease due to the osteomyelitis I am ARDELL, AARONSON ([id number]) recommend that she continue with the doxycycline based on the culture results which showed Eikenella Corrodens as the organism identified that tetracycline should work for. She does not seem to be having any discomfort whatsoever at this point. Electronic Signature(s) Signed: 07/09/2017 6:18:22 PM By: Lenda Kelp PA-C Entered By: Lenda Kelp on 07/09/2017 15:07:59 [name] ([id number]) -------------------------------------------------------------------------------- Physical Exam Details Patient Name: [name]. Date of Service: 07/09/2017 2:15 PM Medical Record Number: [id number] Patient Account Number: [id number] Date of Birth/Sex: Jan 02, 1946 (72 y.o. F) Treating RN: Curtis Sites Primary Care Provider: Annita Brod Other Clinician: Referring Provider: Annita Brod Treating Provider/Extender: STONE III, HOYT Weeks in Treatment: 6 Constitutional Well-nourished and well-hydrated in no acute distress. Respiratory normal breathing without difficulty. clear to auscultation bilaterally. Cardiovascular regular rate and rhythm with normal S1, S2. Psychiatric this patient is able to make decisions and demonstrates good insight into disease process. Alert and Oriented x 3. pleasant and cooperative. Notes At this point patient appears to be doing very well in regard to her sacral ulcer and no debridement was required today. There is no exposed bone in the wound bed this has covered over with granulation tissue since last week. This also appears to be firm  and solid there still drainage noted but overall I'm  very pleased with how things seem to have progressed in the past weeks time. The MRI is still scheduled for Monday at which point we will see how deeply this seems to be affected. That is the sacrum. Electronic Signature(s) Signed: 07/09/2017 6:18:22 PM By: Lenda Kelp PA-C Entered By: Lenda Kelp on 07/09/2017 15:09:43 [name] ([id number]) -------------------------------------------------------------------------------- Physician Orders Details Patient Name: [name]. Date of Service: 07/09/2017 2:15 PM Medical Record Number: [id number] Patient Account Number: [id number] Date of Birth/Sex: 01-Feb-1946 (72 y.o. F) Treating RN: Curtis Sites Primary Care Provider: Annita Brod Other Clinician: Referring Provider: Annita Brod Treating Provider/Extender: Linwood Dibbles, HOYT Weeks in Treatment: 6 Verbal / Phone Orders: No Diagnosis Coding ICD-10 Coding Code Description L89.154 Pressure ulcer of sacral region, stage 4 M48.00 Spinal stenosis, site unspecified R54 Age-related physical debility G89.4 Chronic pain syndrome E78.2 Mixed hyperlipidemia L89.95 Pressure ulcer of unspecified site, unstageable Wound Cleansing Wound #1 Medial Sacrum o Clean wound with Normal Saline. Wound #2 Left Lower Leg o Clean wound with Normal Saline. Wound #3 Right,Lateral Lower Leg o Clean wound with Normal Saline. Anesthetic (add to Medication List) Wound #1 Medial Sacrum o Topical Lidocaine 4% cream applied to wound bed prior to debridement (In Clinic Only). Wound #2 Left Lower Leg o Topical Lidocaine 4% cream applied to wound bed prior to debridement (In Clinic Only). Wound #3 Right,Lateral Lower Leg o Topical Lidocaine 4% cream applied to wound bed prior to debridement (In Clinic Only). Primary Wound Dressing Wound #1 Medial Sacrum o Other: - Vashe soaked gauze - given to patient Wound #2 Left Lower  Leg o Santyl Ointment - PLACE ON OPEN WOUND AREAS ON LATERAL LOWER LEGS Wound #3 Right,Lateral Lower Leg o Silver Collagen Secondary Dressing Wound #1 Medial Sacrum o ABD pad Nowakowski, Hetvi W. ([id number]) o XtraSorb Wound #2 Left Lower Leg o Boardered Foam Dressing Wound #3 Right,Lateral Lower Leg o Boardered Foam Dressing Dressing Change Frequency Wound #1 Medial Sacrum o Change dressing every day. Wound #2 Left Lower Leg o Change dressing every day. Wound #3 Right,Lateral Lower Leg o Change dressing every day. Follow-up Appointments Wound #1 Medial Sacrum o Return Appointment in 1 week. Wound #2 Left Lower Leg o Return Appointment in 1 week. Wound #3 Right,Lateral Lower Leg o Return Appointment in 1 week. Off-Loading Wound #1 Medial Sacrum o Turn and reposition every 2 hours Wound #2 Left Lower Leg o Turn and reposition every 2 hours Wound #3 Right,Lateral Lower Leg o Turn and reposition every 2 hours Additional Orders / Instructions Wound #1 Medial Sacrum o Increase protein intake. Wound #2 Left Lower Leg o Increase protein intake. Wound #3 Right,Lateral Lower Leg o Increase protein intake. Home Health Wound #1 Medial Sacrum o Continue Home Health Visits - Advanced o Home Health Nurse may visit PRN to address patientos wound care needs. o FACE TO FACE ENCOUNTER: MEDICARE and MEDICAID PATIENTS: I certify that this patient is under my care and that I had a face-to-face encounter that meets the physician face-to-face encounter requirements with this patient on this date. The encounter with the patient was in whole or in part for the following MEDICAL JUDEEN, GERALDS. ([id number]) CONDITION: (primary reason for Home Healthcare) MEDICAL NECESSITY: I certify, that based on my findings, NURSING services are a medically necessary home health service. HOME BOUND STATUS: I certify that my clinical findings support that this  patient is homebound (i.e., Due to illness or injury, pt requires aid of supportive devices such  as crutches, cane, wheelchairs, walkers, the use of special transportation or the assistance of another person to leave their place of residence. There is a normal inability to leave the home and doing so requires considerable and taxing effort. Other absences are for medical reasons / religious services and are infrequent or of short duration when for other reasons). o If current dressing causes regression in wound condition, may D/C ordered dressing product/s and apply Normal Saline Moist Dressing daily until next Wound Healing Center / Other MD appointment. Notify Wound Healing Center of regression in wound condition at [phone]. o Please direct any NON-WOUND related issues/requests for orders to patient's Primary Care Physician Wound #2 Left Lower Leg o Continue Home Health Visits - Advanced o Home Health Nurse may visit PRN to address patientos wound care needs. o FACE TO FACE ENCOUNTER: MEDICARE and MEDICAID PATIENTS: I certify that this patient is under my care and that I had a face-to-face encounter that meets the physician face-to-face encounter requirements with this patient on this date. The encounter with the patient was in whole or in part for the following MEDICAL CONDITION: (primary reason for Home Healthcare) MEDICAL NECESSITY: I certify, that based on my findings, NURSING services are a medically necessary home health service. HOME BOUND STATUS: I certify that my clinical findings support that this patient is homebound (i.e., Due to illness or injury, pt requires aid of supportive devices such as crutches, cane, wheelchairs, walkers, the use of special transportation or the assistance of another person to leave their place of residence. There is a normal inability to leave the home and doing so requires considerable and taxing effort. Other absences are for medical  reasons / religious services and are infrequent or of short duration when for other reasons). o If current dressing causes regression in wound condition, may D/C ordered dressing product/s and apply Normal Saline Moist Dressing daily until next Wound Healing Center / Other MD appointment. Notify Wound Healing Center of regression in wound condition at [phone]. o Please direct any NON-WOUND related issues/requests for orders to patient's Primary Care Physician Wound #3 Right,Lateral Lower Leg o Continue Home Health Visits - Advanced o Home Health Nurse may visit PRN to address patientos wound care needs. o FACE TO FACE ENCOUNTER: MEDICARE and MEDICAID PATIENTS: I certify that this patient is under my care and that I had a face-to-face encounter that meets the physician face-to-face encounter requirements with this patient on this date. The encounter with the patient was in whole or in part for the following MEDICAL CONDITION: (primary reason for Home Healthcare) MEDICAL NECESSITY: I certify, that based on my findings, NURSING services are a medically necessary home health service. HOME BOUND STATUS: I certify that my clinical findings support that this patient is homebound (i.e., Due to illness or injury, pt requires aid of supportive devices such as crutches, cane, wheelchairs, walkers, the use of special transportation or the assistance of another person to leave their place of residence. There is a normal inability to leave the home and doing so requires considerable and taxing effort. Other absences are for medical reasons / religious services and are infrequent or of short duration when for other reasons). o If current dressing causes regression in wound condition, may D/C ordered dressing product/s and apply Normal Saline Moist Dressing daily until next Wound Healing Center / Other MD appointment. Notify Wound Healing Center of regression in wound condition at  [phone]. o Please direct any NON-WOUND related issues/requests for orders to  patient's Primary Care Physician Negative Pressure Wound Therapy Wound #1 Medial Sacrum o Place NPWT on HOLD. Consults o Infectious Disease Electronic Signature(s) VALINDA, FEDIE ([id number]) Signed: 07/09/2017 4:53:17 PM By: Curtis Sites Signed: 07/09/2017 6:18:22 PM By: Lenda Kelp PA-C Entered By: Curtis Sites on 07/09/2017 16:06:40 [name] ([id number]) -------------------------------------------------------------------------------- Problem List Details Patient Name: [name]. Date of Service: 07/09/2017 2:15 PM Medical Record Number: [id number] Patient Account Number: [id number] Date of Birth/Sex: 10-04-1945 (72 y.o. F) Treating RN: Curtis Sites Primary Care Provider: Annita Brod Other Clinician: Referring Provider: Annita Brod Treating Provider/Extender: Linwood Dibbles, HOYT Weeks in Treatment: 6 Active Problems ICD-10 Impacting Encounter Code Description Active Date Wound Healing Diagnosis L89.154 Pressure ulcer of sacral region, stage 4 05/27/2017 Yes M48.00 Spinal stenosis, site unspecified 05/27/2017 Yes R54 Age-related physical debility 05/27/2017 Yes G89.4 Chronic pain syndrome 05/27/2017 Yes E78.2 Mixed hyperlipidemia 05/27/2017 Yes L89.95 Pressure ulcer of unspecified site, unstageable 05/27/2017 Yes Inactive Problems Resolved Problems Electronic Signature(s) Signed: 07/09/2017 6:18:22 PM By: Lenda Kelp PA-C Entered By: Lenda Kelp on 07/09/2017 14:05:45 [name] ([id number]) -------------------------------------------------------------------------------- Progress Note Details Patient Name: [name]. Date of Service: 07/09/2017 2:15 PM Medical Record Number: [id number] Patient Account Number: [id number] Date of Birth/Sex: [birth year] (72 y.o. F) Treating RN: Curtis Sites Primary Care Provider: Annita Brod Other  Clinician: Referring Provider: Annita Brod Treating Provider/Extender: Linwood Dibbles, HOYT Weeks in Treatment: 6 Subjective Chief Complaint Information obtained from Patient she is here for evaluation of a sacral ulcer and bilateral lower extremity ulcers History of Present Illness (HPI) 05/27/17-she is here in initial evaluation for a left-sided sacral stage IV pressure ulcer and bilateral lower extremity, lateral aspect, unstageable pressure ulcers. She is accompanied by her husband and her son, who are her primary caregivers. She is bedbound secondary to spinal stenosis. According to her son and husband she was hospitalized from 10/5-11/1 with healthcare associated pneumonia and altered mental status. During her hospitalization she was intubated, extubated on 10/27. She was discharged with Foley catheter and follow up with urology. According to her son and spouse she developed the sacral ulcer during hospitalization. Home health has been applying Santyl daily. She does have a low air loss mattress and is repositioned every 2-3 hours per her family's report. According to the son and spouse she had an appointment with urology on 12/18 and during that appointment developed discoloration to her bilateral lower extremities which ultimately developed into unstageable pressure ulcers to the lateral aspects of her bilateral lower extremities. There has been no topical treatment applied to these. She continues to have home health. There is no concerns expressed regarding dietary intake, stating she eats 3 meals a day, eating was provided; she is supplemented with boost with protein. 06/03/17-she is here in follow-up evaluation for sacral and bilateral lower extremity ulcers. Plain film x-ray done today reveals no distraction to the sacrum or coccyx, no visible abnormalities. Home health has ordered the negative pressure wound system but it has not been initiated. We will continue with Hydrofera Blue  until initiation of negative pressure wound system and continue with Santyl to bilateral lower extremity ulcers. Follow-up next week 06/10/17-she is here in follow-up evaluation for sacral and bilateral lower extremity ulcers. The wound VAC will be available tomorrow per home health. We will initiate wound VAC therapy to the sacral ulcer 3 times weekly (Thursday, Saturday/Sunday, Tuesday). We will continue with Santyl to the lower extremity ulcers. The patient's son is checking into home health  therapy over the weekend for [facility] changes, with the understanding that if VAC changes cannot be performed over the weekend he will need to change his mother's appointments to Monday, Wednesday or Friday. The sacral ulcer clinically does not appear infected but there has been a change in the amount of drainage acutely, there is no significant amount of devitalized tissue, there is no malodor. Wound culture was obtained to evaluate for occult infection we will hold off on antibiotic therapy until sensitivities are resulted. 06/18/17 on evaluation today patient appears to be doing fairly well in my opinion although this is the first time I have seen this patient she has been previously evaluated by Tacey Ruiz here in our office. She is going to be switching to Fridays to see me due to the Wound VAC schedule being changed on Monday, Wednesday, and Friday. Subsequently she seems to be doing fairly well with the Wound VAC. Her son who was present during evaluation today states that he somewhat stresses of the Wound VAC in making sure that it was functioning appropriately. With that being said everything seems to be working well he knows what settings on the [facility] itself to look at and ensure that it is functioning properly. Overall the wound appears to be nice and clean there is no need for debridement today. She has no discomfort in her bilateral lower extremity ulcers also appear to be improving based on measurements and  what this honest tell me about the overall appearance. 07/02/17 on evaluation today I noted in patients wound bed that was actually an odor that had not previously been noted. Subsequently there was also a small area of bone that was not previously noted during my last evaluation. This did appear to be necrotic and was being somewhat forced out by the body around the region of granulation. She does not have any pain which is good news. Nonetheless the overall appearance of the ulcer is making me concerned for the patient having developed osteomyelitis. Currently she is not been on any antibiotics and the Wound VAC has been doing fairly well in general. With that being said I do not think we need to continue the Wound VAC if she potentially has a bone infection. At least not until it is properly addressed with antibiotics. NEVE, BRANSCOMB ([id number]) 07/09/17 on evaluation today patient appears to actually be doing very well in regard to her sacral ulcer compared to last week. There is actually no exposed bone at this point. 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 With that being said still I do believe she needs to have a referral to infectious disease due to the osteomyelitis I am gonna recommend that she continue with the doxycycline based on the culture results which showed Eikenella Corrodens as the organism identified that tetracycline should work for. She does not seem to be having any discomfort whatsoever at this point. Patient History Information obtained from Patient. Family History Hypertension - Father, Lung Disease - Father, No family history of Cancer, Diabetes, Heart Disease, Kidney Disease, Seizures, Stroke, Thyroid Problems, Tuberculosis. Social History Never smoker, Marital Status - Married, Alcohol Use - Never, Drug  Use - No History, Caffeine Use - Daily. Medical History Hospitalization/Surgery History - 01/15/2017, [facility], UTI and yeats infection with pneumonia. Review of Systems (ROS) Constitutional Symptoms (General Health) Denies complaints or symptoms of  Fever, Chills. Respiratory The patient has no complaints or symptoms. Cardiovascular The patient has no complaints or symptoms. Psychiatric The patient has no complaints or symptoms. Objective Constitutional Well-nourished and well-hydrated in no acute distress. Vitals Time Taken: 2:05 PM, Temperature: 97.6 F, Pulse: 93 bpm, Respiratory Rate: 16 breaths/min, Blood Pressure: 122/79 mmHg. Respiratory normal breathing without difficulty. clear to auscultation bilaterally. Cardiovascular regular rate and rhythm with normal S1, S2. Psychiatric this patient is able to make decisions and demonstrates good insight into disease process. Alert and Oriented x 3. pleasant and cooperative. Duard BradyOVERMAN, Afsa W. ([id number]) General Notes: At this point patient appears to be doing very well in regard to her sacral ulcer and no debridement was required today. There is no exposed bone in the wound bed this has covered over with granulation tissue since last week. This also appears to be firm and solid there still drainage noted but overall I'm very pleased with how things seem to have progressed in the past weeks time. The MRI is still scheduled for Monday at which point we will see how deeply this seems to be affected. That is the sacrum. Integumentary (Hair, Skin) Wound #1 status is Open. Original cause of wound was Pressure Injury. The wound is located on the Medial Sacrum. The wound measures 8cm length x 6cm width x 2.5cm depth; 37.699cm^2 area and 94.248cm^3 volume. There is bone, muscle, and Fat Layer (Subcutaneous Tissue) Exposed exposed. There is no tunneling noted, however, there is undermining starting at 8:00 and ending at 2:00 with a maximum  distance of 2.1cm. There is a large amount of serous drainage noted. Foul odor after cleansing was noted. The wound margin is distinct with the outline attached to the wound base. There is large (67- 100%) red, hyper - granulation within the wound bed. There is a small (1-33%) amount of necrotic tissue within the wound bed including Eschar and Adherent Slough. The periwound skin appearance exhibited: Induration. The periwound skin appearance did not exhibit: Callus, Crepitus, Excoriation, Rash, Scarring, Dry/Scaly, Maceration, Atrophie Blanche, Cyanosis, Ecchymosis, Hemosiderin Staining, Mottled, Pallor, Rubor, Erythema. Periwound temperature was noted as No Abnormality. The periwound has tenderness on palpation. Wound #2 status is Open. Original cause of wound was Gradually Appeared. The wound is located on the Left Lower Leg. The wound measures 2.5cm length x 1.8cm width x 0.1cm depth; 3.534cm^2 area and 0.353cm^3 volume. Wound #3 status is Open. Original cause of wound was Gradually Appeared. The wound is located on the Right,Lateral Lower Leg. The wound measures 0.5cm length x 0.4cm width x 0.1cm depth; 0.157cm^2 area and 0.016cm^3 volume. There is no tunneling or undermining noted. There is a none present amount of drainage noted. The wound margin is flat and intact. There is medium (34-66%) red granulation within the wound bed. There is a medium (34-66%) amount of necrotic tissue within the wound bed including Eschar. The periwound skin appearance did not exhibit: Callus, Crepitus, Excoriation, Induration, Rash, Scarring, Dry/Scaly, Maceration, Atrophie Blanche, Cyanosis, Ecchymosis, Hemosiderin Staining, Mottled, Pallor, Rubor, Erythema. Periwound temperature was noted as No Abnormality. The periwound has tenderness on palpation. 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 Wound #2 Left Lower Leg: Clean wound with Normal Saline. Duard BradyOVERMAN, Meylin W. ([id number]) Wound #3  Right,Lateral Lower Leg: Clean wound with Normal Saline. Anesthetic (add to Medication List): Wound #1 Medial Sacrum: Topical Lidocaine 4% cream applied to wound bed prior to debridement (In Clinic Only). Wound #2 Left Lower Leg: Topical Lidocaine 4% cream applied to wound bed prior to debridement (In Clinic Only). Wound #3 Right,Lateral Lower Leg: Topical Lidocaine 4% cream applied to wound bed prior to debridement (In Clinic Only). Primary Wound Dressing: Wound #1 Medial Sacrum: Other: - Dakin's soaked gauze Wound #2 Left Lower Leg: Santyl Ointment - PLACE ON OPEN WOUND AREAS ON LATERAL LOWER LEGS Wound #3 Right,Lateral Lower Leg: Silver Collagen Secondary Dressing: Wound #1 Medial Sacrum: ABD pad XtraSorb Wound #2 Left Lower Leg: Boardered Foam Dressing Wound #3 Right,Lateral Lower Leg: Boardered Foam Dressing Dressing Change Frequency: Wound #1 Medial Sacrum: Change dressing every day. Wound #2 Left Lower Leg: Change dressing every day. Wound #3 Right,Lateral Lower Leg: Change dressing every day. Follow-up Appointments: Wound #1 Medial Sacrum: Return Appointment in 1 week. Wound #2 Left Lower Leg: Return Appointment in 1 week. Wound #3 Right,Lateral Lower Leg: Return Appointment in 1 week. Off-Loading: Wound #1 Medial Sacrum: Turn and reposition every 2 hours Wound #2 Left Lower Leg: Turn and reposition every 2 hours Wound #3 Right,Lateral Lower Leg: Turn and reposition every 2 hours Additional Orders / Instructions: Wound #1 Medial Sacrum: Increase protein intake. Wound #2 Left Lower Leg: Increase protein intake. Wound #3 Right,Lateral Lower Leg: Increase  protein intake. Home Health: Wound #1 Medial Sacrum: Continue Home Health Visits - [facility] Nurse may visit PRN to address patient s wound care needs. FACE TO FACE ENCOUNTER: MEDICARE and MEDICAID PATIENTS: I certify that this patient is under my care and that I had a face-to-face encounter that meets the physician face-to-face encounter requirements with this patient on this date. The encounter with the patient was in whole or in part for the following MEDICAL CONDITION: (primary reason for Home Healthcare) MEDICAL NECESSITY: I certify, that based on my findings, NURSING services are a medically necessary home KYLENA, MOLE. ([id number]) health service. HOME BOUND STATUS: I certify that my clinical findings support that this patient is homebound (i.e., Due to illness or injury, pt requires aid of supportive devices such as crutches, cane, wheelchairs, walkers, the use of special transportation or the assistance of another person to leave their place of residence. There is a normal inability to leave the home and doing so requires considerable and taxing effort. Other absences are for medical reasons / religious services and are infrequent or of short duration when for other reasons). If current dressing causes regression in wound condition, may D/C ordered dressing product/s and apply Normal Saline Moist Dressing daily until next Wound Healing Center / Other MD appointment. Notify Wound Healing Center of regression in wound condition at [phone]. Please direct any NON-WOUND related issues/requests for orders to patient's Primary Care Physician Wound #2 Left Lower Leg: Continue Home Health Visits - [facility] Nurse may visit PRN to address patient s wound care needs. FACE TO FACE ENCOUNTER: MEDICARE and MEDICAID PATIENTS: I certify that this patient is under my care and that I had a face-to-face encounter that meets the physician face-to-face encounter  requirements with this patient on this date. The encounter with the patient was in whole or in part for the following MEDICAL CONDITION: (primary reason for Home Healthcare) MEDICAL NECESSITY: I certify, that based on my findings, NURSING services are a medically necessary home health service. HOME BOUND STATUS:  I certify that my clinical findings support that this patient is homebound (i.e., Due to illness or injury, pt requires aid of supportive devices such as crutches, cane, wheelchairs, walkers, the use of special transportation or the assistance of another person to leave their place of residence. There is a normal inability to leave the home and doing so requires considerable and taxing effort. Other absences are for medical reasons / religious services and are infrequent or of short duration when for other reasons). If current dressing causes regression in wound condition, may D/C ordered dressing product/s and apply Normal Saline Moist Dressing daily until next Wound Healing Center / Other MD appointment. Notify Wound Healing Center of regression in wound condition at [phone]. Please direct any NON-WOUND related issues/requests for orders to patient's Primary Care Physician Wound #3 Right,Lateral Lower Leg: Continue Home Health Visits - [facility] Nurse may visit PRN to address patient s wound care needs. FACE TO FACE ENCOUNTER: MEDICARE and MEDICAID PATIENTS: I certify that this patient is under my care and that I had a face-to-face encounter that meets the physician face-to-face encounter requirements with this patient on this date. The encounter with the patient was in whole or in part for the following MEDICAL CONDITION: (primary reason for Home Healthcare) MEDICAL NECESSITY: I certify, that based on my findings, NURSING services are a medically necessary home health service. HOME BOUND STATUS: I certify that my clinical findings support that this patient is homebound  (i.e., Due to illness or injury, pt requires aid of supportive devices such as crutches, cane, wheelchairs, walkers, the use of special transportation or the assistance of another person to leave their place of residence. There is a normal inability to leave the home and doing so requires considerable and taxing effort. Other absences are for medical reasons / religious services and are infrequent or of short duration when for other reasons). If current dressing causes regression in wound condition, may D/C ordered dressing product/s and apply Normal Saline Moist Dressing daily until next Wound Healing Center / Other MD appointment. Notify Wound Healing Center of regression in wound condition at [phone]. Please direct any NON-WOUND related issues/requests for orders to patient's Primary Care Physician Negative Pressure Wound Therapy: Wound #1 Medial Sacrum: Place NPWT on HOLD. Consults ordered were: Infectious Disease I discussed with patient son that I would recommend that we continue with the current wound care orders for the next week. I am going to continue to put a hold on the Wound VAC I would like for her to be well controlled with antibiotics prior to reinitiating the Wound VAC although I still think that is an appropriate treatment. We are gonna make a referral for infectious disease in order to ensure that the appropriate anabolic regimen is initiated and continued for the appropriate amount of time. Patient son is in agreement with this plan. Please see above for specific wound care orders. We will see patient for re-evaluation in 1 week(s) here in the clinic. If anything worsens or changes patient will contact our office for additional recommendations. ELLIS, KOFFLER ([id number]) Electronic Signature(s) Signed: 07/09/2017 6:18:22 PM By: Lenda Kelp PA-C Entered By: Lenda Kelp on 07/09/2017 15:11:02 [name]  ([id number]) -------------------------------------------------------------------------------- ROS/PFSH Details Patient Name: [name]. Date of Service: 07/09/2017 2:15 PM Medical Record Number: [id number] Patient Account Number: [id number] Date of Birth/Sex: 10/13/1945 (72 y.o. F) Treating RN: Curtis Sites Primary Care Provider: Annita Brod Other Clinician: Referring Provider: Annita Brod Treating Provider/Extender:  STONE III, HOYT Weeks in Treatment: 6 Information Obtained From Patient Wound History Do you currently have one or more open woundso Yes How many open wounds do you currently haveo 3 How have you been treating your wound(s) until nowo 3 months Has your wound(s) ever healed and then re-openedo No Have you had any lab work done in the past montho No Have you tested positive for an antibiotic resistant organism (MRSA, VRE)o No Constitutional Symptoms (General Health) Complaints and Symptoms: Negative for: Fever; Chills Eyes Medical History: Negative for: Cataracts; Glaucoma; Optic Neuritis Hematologic/Lymphatic Medical History: Positive for: Anemia; Lymphedema Negative for: Hemophilia; Human Immunodeficiency Virus; Sickle Cell Disease Respiratory Complaints and Symptoms: No Complaints or Symptoms Medical History: Negative for: Aspiration; Asthma; Chronic Obstructive Pulmonary Disease (COPD); Pneumothorax; Sleep Apnea; Tuberculosis Cardiovascular Complaints and Symptoms: No Complaints or Symptoms Medical History: Positive for: Deep Vein Thrombosis Negative for: Angina; Arrhythmia; Congestive Heart Failure; Coronary Artery Disease; Hypertension; Hypotension; Myocardial Infarction Gastrointestinal Medical History: Negative for: Cirrhosis ; Colitis; Crohnos; Hepatitis A; Hepatitis B; Hepatitis C NOAH, PELAEZ. ([id number]) Endocrine Medical History: Negative for: Type I Diabetes; Type II Diabetes Genitourinary Medical History: Negative for:  End Stage Renal Disease Immunological Medical History: Negative for: Lupus Erythematosus; Raynaudos; Scleroderma Integumentary (Skin) Medical History: Negative for: History of Burn; History of pressure wounds Musculoskeletal Medical History: Negative for: Gout; Rheumatoid Arthritis; Osteoarthritis; Osteomyelitis Neurologic Medical History: Positive for: Quadriplegia Negative for: Dementia; Neuropathy; Paraplegia; Seizure Disorder Psychiatric Complaints and Symptoms: No Complaints or Symptoms Medical History: Negative for: Anorexia/bulimia; Confinement Anxiety Immunizations Pneumococcal Vaccine: Received Pneumococcal Vaccination: Yes Implantable Devices Hospitalization / Surgery History Name of Hospital Purpose of Hospitalization/Surgery Date [facility] UTI and yeats infection with pneumonia 01/15/2017 Family and Social History Cancer: No; Diabetes: No; Heart Disease: No; Hypertension: Yes - Father; Kidney Disease: No; Lung Disease: Yes - Father; Seizures: No; Stroke: No; Thyroid Problems: No; Tuberculosis: No; Never smoker; Marital Status - Married; Alcohol Use: Never; Drug Use: No History; Caffeine Use: Daily; Financial Concerns: No; Food, Clothing or Shelter Needs: No; Support System Lacking: No; Advanced Directives: No; Patient does not want information on Advanced Directives; Do not resuscitate: No; Living Will: No; Medical Power of Attorney: No Physician Affirmation I have reviewed and agree with the above information. GREG, ECKRICH ([id number]) Electronic Signature(s) Signed: 07/09/2017 4:53:17 PM By: Curtis Sites Signed: 07/09/2017 6:18:22 PM By: Lenda Kelp PA-C Entered By: Lenda Kelp on 07/09/2017 15:08:31 [name] ([id number]) -------------------------------------------------------------------------------- SuperBill Details Patient Name: [name]. Date of Service: 07/09/2017 Medical Record Number: [id number] Patient Account Number:  [id number] Date of Birth/Sex: [birth year] (72 y.o. F) Treating RN: Curtis Sites Primary Care Provider: Annita Brod Other Clinician: Referring Provider: Annita Brod Treating Provider/Extender: Linwood Dibbles, HOYT Weeks in Treatment: 6 Diagnosis Coding ICD-10 Codes Code Description L89.154 Pressure ulcer of sacral region, stage 4 M48.00 Spinal stenosis, site unspecified R54 Age-related physical debility G89.4 Chronic pain syndrome E78.2 Mixed hyperlipidemia L89.95 Pressure ulcer of unspecified site, unstageable Facility Procedures CPT4 Code: [id number] Description: 99214 - WOUND CARE VISIT-LEV 4 EST PT Modifier: Quantity: 1 Physician Procedures CPT4 Code: [id number] Description: 99214 - WC PHYS LEVEL 4 - EST PT ICD-10 Diagnosis Description L89.154 Pressure ulcer of sacral region, stage 4 M48.00 Spinal stenosis, site unspecified R54 Age-related physical debility G89.4 Chronic pain syndrome Modifier: Quantity: 1 Electronic Signature(s) Signed: 07/09/2017 6:18:22 PM By: Lenda Kelp PA-C Entered By: Lenda Kelp on 07/09/2017 15:11:19

## 2017-07-11 NOTE — Progress Notes (Signed)
Jessica, [name] ([id number]) Visit Report for 07/09/2017 Arrival Information Details [name], Jessica [name]. Date of Service: 07/09/2017 2:15 PM Medical Record Number: [id number] Patient Account Number: [id number] Date of Birth/Sex: Apr 08, 1946 (72 y.o. F) F) Treating RN: Jessica [name] Primary Care Jessica [name]: Jessica [name] Other Clinician: Referring Jessica [name]: Jessica [name] Treating Jessica [name]/Extender: Jessica [name], Jessica [name] in Treatment: 6 Visit Information History Since Last Visit All ordered tests and consults were completed: No Patient Arrived: Stretcher Added or deleted any medications: No Arrival Time: 14:04 Any new allergies or adverse reactions: No Accompanied By: son Had a fall or experienced change in No Transfer Assistance: None activities of daily living that may affect Patient Identification Verified: Yes risk of falls: Secondary Verification Process Completed: Yes Signs or symptoms of abuse/neglect since last visito No Patient Has Alerts: Yes Hospitalized since last visit: No Patient Alerts: allergic to zinc Implantable device outside of the clinic excluding No cellular tissue based products placed in the center since last visit: Pain Present Now: No Electronic Signature(s) Signed: 07/09/2017 4:26:13 PM By: Jessica [name] Entered By: Jessica [name] on 07/09/2017 14:04:35 Jessica [name] ([id number]) -------------------------------------------------------------------------------- Clinic Level of Care Assessment Details [name]. Date of Service: 07/09/2017 2:15 PM Medical Record Number: [id number] Patient Account Number: [id number] Date of Birth/Sex: 1945-06-14 (72 y.o. F) F) Treating RN: Jessica [name] Primary Care Korin Hartwell: Jessica [name] Other Clinician: Referring Jessica [name]: Jessica [name] Treating Jessica [name]/Extender: Jessica [name], Jessica [name] in Treatment: 6 Clinic Level of Care Assessment Items TOOL 4 Quantity Score []  - Use  when only an EandM is performed on FOLLOW-UP visit 0 ASSESSMENTS - Nursing Assessment / Reassessment X - Reassessment of Co-morbidities (includes updates in patient status) 1 10 X- 1 5 Reassessment of Adherence to Treatment Plan ASSESSMENTS - Wound and Skin Assessment / Reassessment []  - Simple Wound Assessment / Reassessment - one wound 0 X- 3 5 Complex Wound Assessment / Reassessment - multiple wounds []  - 0 Dermatologic / Skin Assessment (not related to wound area) ASSESSMENTS - Focused Assessment []  - Circumferential Edema Measurements - multi extremities 0 []  - 0 Nutritional Assessment / Counseling / Intervention []  - 0 Lower Extremity Assessment (monofilament, tuning fork, pulses) []  - 0 Peripheral Arterial Disease Assessment (using hand held doppler) ASSESSMENTS - Ostomy and/or Continence Assessment and Care X - Incontinence Assessment and Management 1 10 []  - 0 Ostomy Care Assessment and Management (repouching, etc.) PROCESS - Coordination of Care X - Simple Patient / Family Education for ongoing care 1 15 []  - 0 Complex (extensive) Patient / Family Education for ongoing care []  - 0 Staff obtains Chiropractor, Records, Test Results / Process Orders []  - 0 Staff telephones HHA, Nursing Homes / Clarify orders / etc []  - 0 Routine Transfer to another Facility (non-emergent condition) []  - 0 Routine Hospital Admission (non-emergent condition) []  - 0 New Admissions / Manufacturing engineer / Ordering NPWT, Apligraf, etc. []  - 0 Emergency Hospital Admission (emergent condition) X- 1 10 Simple Discharge Coordination Jessica, [name]. ([id number]) []  - 0 Complex (extensive) Discharge Coordination PROCESS - Special Needs []  - Pediatric / Minor Patient Management 0 []  - 0 Isolation Patient Management []  - 0 Hearing / Language / Visual special needs []  - 0 Assessment of Community assistance (transportation, D/C planning, etc.) []  - 0 Additional assistance / Altered  mentation []  - 0 Support Surface(s) Assessment (bed, cushion, seat, etc.) INTERVENTIONS - Wound Cleansing / Measurement []  - Simple Wound Cleansing - one wound 0 X- 3 5 Complex Wound  Cleansing - multiple wounds X- 1 5 Wound Imaging (photographs - any number of wounds) []  - 0 Wound Tracing (instead of photographs) []  - 0 Simple Wound Measurement - one wound X- 3 5 Complex Wound Measurement - multiple wounds INTERVENTIONS - Wound Dressings X - Small Wound Dressing one or multiple wounds 3 10 []  - 0 Medium Wound Dressing one or multiple wounds []  - 0 Large Wound Dressing one or multiple wounds []  - 0 Application of Medications - topical []  - 0 Application of Medications - injection INTERVENTIONS - Miscellaneous []  - External ear exam 0 []  - 0 Specimen Collection (cultures, biopsies, blood, body fluids, etc.) []  - 0 Specimen(s) / Culture(s) sent or taken to Lab for analysis []  - 0 Patient Transfer (multiple staff / Nurse, adult / Similar devices) []  - 0 Simple Staple / Suture removal (25 or less) []  - 0 Complex Staple / Suture removal (26 or more) []  - 0 Hypo / Hyperglycemic Management (close monitor of Blood Glucose) []  - 0 Ankle / Brachial Index (ABI) - do not check if billed separately X- 1 5 Vital Signs Jessica, Jamielyn [name]. ([id number]) Has the patient been seen at the hospital within the last three years: Yes Total Score: 135 Level Of Care: New/Established - Level 4 Electronic Signature(s) Signed: 07/09/2017 4:53:17 PM By: Jessica [name] Entered By: Jessica [name] on 07/09/2017 15:03:10 Jessica [name] ([id number]) -------------------------------------------------------------------------------- Encounter Discharge Information Details [name]. Date of Service: 07/09/2017 2:15 PM Medical Record Number: [id number] Patient Account Number: [id number] Date of Birth/Sex: [birth year] (72 y.o. F) Treating RN: Jessica [name] Primary Care Jessica [name]:  Jessica [name] Other Clinician: Referring Jessica [name]: Jessica [name] Treating Jessica [name]/Extender: Jessica [name], Jessica [name] in Treatment: 6 Encounter Discharge Information Items Discharge Pain Level: 0 Discharge Condition: Stable Ambulatory Status: Stretcher Discharge Destination: Home Transportation: Ambulance Accompanied By: son Schedule Follow-up Appointment: Yes Medication Reconciliation completed and No provided to Patient/Care Malin Sambrano: Provided on Clinical Summary of Care: 07/09/2017 Form Type Recipient Paper Patient PO Electronic Signature(s) Signed: 07/09/2017 4:44:36 PM By: Elliot Gurney, BSN, RN, CWS, Kim RN, BSN Entered By: Elliot Gurney, BSN, RN, CWS, Kim on 07/09/2017 16:43:50 Jessica [name] ([id number]) -------------------------------------------------------------------------------- Lower Extremity Assessment Details [name]. Date of Service: 07/09/2017 2:15 PM Medical Record Number: [id number] Patient Account Number: [id number] Date of Birth/Sex: 17-Mar-1946 (72 y.o. F) Treating RN: Jessica [name] Primary Care Vennie Salsbury: Jessica [name] Other Clinician: Referring Kacelyn Rowzee: Jessica [name] Treating Commodore Bellew/Extender: STONE III, Jessica [name] in Treatment: 6 Edema Assessment Assessed: [Left: No] [Right: No] Edema: [Left: No] [Right: No] Vascular Assessment Claudication: Claudication Assessment [Left:None] [Right:None] Pulses: Dorsalis Pedis Palpable: [Left:Yes] [Right:Yes] Posterior Tibial Extremity colors, hair growth, and conditions: Extremity Color: [Left:Normal] [Right:Normal] Hair Growth on Extremity: [Left:Yes] [Right:Yes] Temperature of Extremity: [Left:Warm] [Right:Warm] Capillary Refill: [Left:< 3 seconds] [Right:< 3 seconds] Toe Nail Assessment Left: Right: Thick: No No Discolored: No No Deformed: No No Improper Length and Hygiene: No No Electronic Signature(s) Signed: 07/09/2017 4:26:13 PM By: Jessica [name] Entered By: Jessica [name]  on 07/09/2017 14:20:30 Jessica [name] ([id number]) -------------------------------------------------------------------------------- Multi Wound Chart Details [name]. Date of Service: 07/09/2017 2:15 PM Medical Record Number: [id number] Patient Account Number: [id number] Date of Birth/Sex: 09-28-1945 (72 y.o. F) Treating RN: Jessica [name] Primary Care Daton Szilagyi: Jessica [name] Other Clinician: Referring Kylin Dubs: Jessica [name] Treating Hairo Garraway/Extender: STONE III, Jessica [name] in Treatment: 6 Vital Signs Height(in): Pulse(bpm): 93 Weight(lbs): Blood Pressure(mmHg): 122/79 Body Mass Index(BMI): Temperature(F): 97.6 Respiratory Rate 16 (breaths/min): Photos: [1:No Photos] [  2:No Photos] [3:No Photos] Wound Location: [1:Sacrum - Medial] [2:Left Lower Leg] [3:Right Lower Leg - Lateral] Wounding Event: [1:Pressure Injury] [2:Gradually Appeared] [3:Gradually Appeared] Primary Etiology: [1:Pressure Ulcer] [2:Pressure Ulcer] [3:Pressure Ulcer] Comorbid History: [1:Anemia, Lymphedema, Deep N/A Vein Thrombosis, Quadriplegia] [3:Anemia, Lymphedema, Deep Vein Thrombosis, Quadriplegia] Date Acquired: [1:01/15/2017] [2:04/28/2017] [3:04/28/2017] [name] of Treatment: [1:6] [2:6] [3:6] Wound Status: [1:Open] [2:Open] [3:Open] Measurements L x [name] x D [1:8x6x2.5] [2:2.5x1.8x0.1] [3:0.5x0.4x0.1] (cm) Area (cm) : [1:37.699] [2:3.534] [3:0.157] Volume (cm) : [1:94.248] [2:0.353] [3:0.016] % Reduction in Area: [1:-2.30%] [2:-650.30%] [3:44.50%] % Reduction in Volume: [1:-155.80%] [2:-275.50%] [3:42.90%] Starting Position 1 [1:8] (o'clock): Ending Position 1 [1:2] (o'clock): Maximum Distance 1 (cm): [1:2.1] Undermining: [1:Yes] [2:N/A] [3:No] Classification: [1:Category/Stage IV] [2:Category/Stage III] [3:Category/Stage II] Exudate Amount: [1:Large] [2:N/A] [3:None Present] Exudate Type: [1:Serous] [2:N/A] [3:N/A] Exudate Color: [1:amber] [2:N/A] [3:N/A] Foul Odor  After Cleansing: [1:Yes] [2:N/A] [3:No] Odor Anticipated Due to [1:No] [2:N/A] [3:N/A] Product Use: Wound Margin: [1:Distinct, outline attached] [2:N/A] [3:Flat and Intact] Granulation Amount: [1:Large (67-100%)] [2:N/A] [3:Medium (34-66%)] Granulation Quality: [1:Red, Hyper-granulation] [2:N/A] [3:Red] Necrotic Amount: [1:Small (1-33%)] [2:N/A] [3:Medium (34-66%)] Necrotic Tissue: [1:Eschar, Adherent Slough] [2:N/A] [3:Eschar] Exposed Structures: [1:Fat Layer (Subcutaneous Tissue) Exposed: Yes Muscle: Yes Bone: Yes] [2:N/A] [3:Fascia: No Fat Layer (Subcutaneous Tissue) Exposed: No Tendon: No] Fascia: No Muscle: No Tendon: No Joint: No Joint: No Bone: No Epithelialization: None N/A Large (67-100%) Periwound Skin Texture: Induration: Yes No Abnormalities Noted Excoriation: No Excoriation: No Induration: No Callus: No Callus: No Crepitus: No Crepitus: No Rash: No Rash: No Scarring: No Scarring: No Periwound Skin Moisture: Maceration: No No Abnormalities Noted Maceration: No Dry/Scaly: No Dry/Scaly: No Periwound Skin Color: Atrophie Blanche: No No Abnormalities Noted Atrophie Blanche: No Cyanosis: No Cyanosis: No Ecchymosis: No Ecchymosis: No Erythema: No Erythema: No Hemosiderin Staining: No Hemosiderin Staining: No Mottled: No Mottled: No Pallor: No Pallor: No Rubor: No Rubor: No Temperature: No Abnormality N/A No Abnormality Tenderness on Palpation: Yes No Yes Wound Preparation: Ulcer Cleansing: N/A Ulcer Cleansing: Rinsed/Irrigated with Saline Rinsed/Irrigated with Saline Topical Anesthetic Applied: Topical Anesthetic Applied: Other: lidocaine 4% Other: LIDOCAINE 4% Treatment Notes Electronic Signature(s) Signed: 07/09/2017 4:53:17 PM By: Jessica [name] Entered By: Jessica [name] on 07/09/2017 14:55:37 Jessica [name] ([id number]) -------------------------------------------------------------------------------- Multi-Disciplinary Care Plan  Details [name]. Date of Service: 07/09/2017 2:15 PM Medical Record Number: [id number] Patient Account Number: [id number] Date of Birth/Sex: May 03, 1945 (72 y.o. F) Treating RN: Jessica [name] Primary Care Rozelia Catapano: Jessica [name] Other Clinician: Referring Triana Coover: Jessica [name] Treating Nashalie Sallis/Extender: STONE III, Jessica [name] in Treatment: 6 Active Inactive ` Orientation to the Wound Care Program Nursing Diagnoses: Knowledge deficit related to the wound healing center program Goals: Patient/caregiver will verbalize understanding of the Wound Healing Center Program Date Initiated: 06/03/2017 Target Resolution Date: 06/17/2017 Goal Status: Active Interventions: Provide education on orientation to the wound center Notes: ` Pressure Nursing Diagnoses: Knowledge deficit related to causes and risk factors for pressure ulcer development Knowledge deficit related to management of pressures ulcers Potential for impaired tissue integrity related to pressure, friction, moisture, and shear Goals: Patient will remain free from development of additional pressure ulcers Date Initiated: 06/03/2017 Target Resolution Date: 06/17/2017 Goal Status: Active Patient will remain free of pressure ulcers Date Initiated: 06/03/2017 Target Resolution Date: 06/17/2017 Goal Status: Active Patient/caregiver will verbalize risk factors for pressure ulcer development Date Initiated: 06/03/2017 Target Resolution Date: 06/17/2017 Goal Status: Active Interventions: Assess: immobility, friction, shearing, incontinence upon admission and as needed Assess potential for pressure ulcer upon admission and as  needed Notes: ` Wound/Skin Impairment Jessica [name], Jessica [name] ([id number]) Nursing Diagnoses: Impaired tissue integrity Goals: Patient/caregiver will verbalize understanding of skin care regimen Date Initiated: 06/03/2017 Target Resolution Date: 06/17/2017 Goal Status: Active Ulcer/skin  breakdown will have a volume reduction of 30% by week 4 Date Initiated: 06/03/2017 Target Resolution Date: 06/17/2017 Goal Status: Active Interventions: Assess ulceration(s) every visit Treatment Activities: Patient referred to home care : 06/03/2017 Skin care regimen initiated : 06/03/2017 Notes: Electronic Signature(s) Signed: 07/09/2017 4:53:17 PM By: Jessica [name] Entered By: Jessica [name] on 07/09/2017 14:54:50 Jessica [name] ([id number]) -------------------------------------------------------------------------------- Pain Assessment Details [name]. Date of Service: 07/09/2017 2:15 PM Medical Record Number: [id number] Patient Account Number: [id number] Date of Birth/Sex: March 24, 1946 (72 y.o. F) Treating RN: Jessica [name] Primary Care Cleveland Paiz: Jessica [name] Other Clinician: Referring Priscilla Kirstein: Jessica [name] Treating Minka Knight/Extender: STONE III, Jessica [name] in Treatment: 6 Active Problems Location of Pain Severity and Description of Pain Patient Has Paino No Site Locations Pain Management and Medication Current Pain Management: Electronic Signature(s) Signed: 07/09/2017 4:26:13 PM By: Jessica [name] Entered By: Jessica [name] on 07/09/2017 14:04:42 Jessica [name] ([id number]) -------------------------------------------------------------------------------- Patient/Caregiver Education Details [name]. Date of Service: 07/09/2017 2:15 PM Medical Record Number: [id number] Patient Account Number: [id number] Date of Birth/Gender: [birth year] (72 y.o. F) Treating RN: Huel Coventry Primary Care Physician: Jessica [name] Other Clinician: Referring Physician: Annita [name] Treating Physician/Extender: Skeet Simmer in Treatment: 6 Education Assessment Education Provided To: Patient and Caregiver Education Topics Provided Offloading: Handouts: What is Offloadingo Methods: Demonstration,  Explain/Verbal Responses: State content correctly Wound/Skin Impairment: Handouts: Caring for Your Ulcer Methods: Demonstration, Explain/Verbal Responses: State content correctly Electronic Signature(s) Signed: 07/09/2017 4:44:36 PM By: Elliot Gurney, BSN, RN, CWS, Kim RN, BSN Entered By: Elliot Gurney, BSN, RN, CWS, Kim on 07/09/2017 16:44:10 Jessica [name] ([id number]) -------------------------------------------------------------------------------- Wound Assessment Details [name]. Date of Service: 07/09/2017 2:15 PM Medical Record Number: [id number] Patient Account Number: [id number] Date of Birth/Sex: Dec 04, 1945 (72 y.o. F) Treating RN: Jessica [name] Primary Care Sidnee Gambrill: Jessica [name] Other Clinician: Referring Hiro Vipond: Jessica [name] Treating Reilly Blades/Extender: STONE III, Jessica [name] in Treatment: 6 Wound Status Wound Number: 1 Primary Pressure Ulcer Etiology: Wound Location: Sacrum - Medial Wound Status: Open Wounding Event: Pressure Injury Comorbid Anemia, Lymphedema, Deep Vein Date Acquired: 01/15/2017 History: Thrombosis, Quadriplegia [name] Of Treatment: 6 Clustered Wound: No Photos Photo Uploaded By: Jessica [name] on 07/09/2017 16:23:51 Wound Measurements Length: (cm) 8 Width: (cm) 6 Depth: (cm) 2.5 Area: (cm) 37.699 Volume: (cm) 94.248 % Reduction in Area: -2.3% % Reduction in Volume: -155.8% Epithelialization: None Tunneling: No Undermining: Yes Starting Position (o'clock): 8 Ending Position (o'clock): 2 Maximum Distance: (cm) 2.1 Wound Description Classification: Category/Stage IV Wound Margin: Distinct, outline attached Exudate Amount: Large Exudate Type: Serous Exudate Color: amber Foul Odor After Cleansing: Yes Due to Product Use: No Slough/Fibrino Yes Wound Bed Granulation Amount: Large (67-100%) Exposed Structure Granulation Quality: Red, Hyper-granulation Fascia Exposed: No Necrotic Amount: Small (1-33%) Fat Layer  (Subcutaneous Tissue) Exposed: Yes Necrotic Quality: Eschar, Adherent Slough Tendon Exposed: No Muscle Exposed: Yes Jessica [name], VOGL. ([id number]) Necrosis of Muscle: No Joint Exposed: No Bone Exposed: Yes Periwound Skin Texture Texture Color No Abnormalities Noted: No No Abnormalities Noted: No Callus: No Atrophie Blanche: No Crepitus: No Cyanosis: No Excoriation: No Ecchymosis: No Induration: Yes Erythema: No Rash: No Hemosiderin Staining: No Scarring: No Mottled: No Pallor: No Moisture Rubor: No No Abnormalities Noted: No Dry / Scaly: No Temperature / Pain Maceration: No  Temperature: No Abnormality Tenderness on Palpation: Yes Wound Preparation Ulcer Cleansing: Rinsed/Irrigated with Saline Topical Anesthetic Applied: Other: lidocaine 4%, Treatment Notes Wound #1 (Medial Sacrum) Notes Vashe wet gauze, xsorb, abd Electronic Signature(s) Signed: 07/09/2017 4:26:13 PM By: Jessica [name] Entered By: Jessica [name] on 07/09/2017 14:19:43 Jessica [name] ([id number]) -------------------------------------------------------------------------------- Wound Assessment Details [name]. Date of Service: 07/09/2017 2:15 PM Medical Record Number: [id number] Patient Account Number: [id number] Date of Birth/Sex: January 28, 1946 (72 y.o. F) Treating RN: Jessica [name] Primary Care Charmane Protzman: Jessica [name] Other Clinician: Referring Tyrae Alcoser: Jessica [name] Treating Abryana Lykens/Extender: STONE III, Jessica [name] in Treatment: 6 Wound Status Wound Number: 2 Primary Etiology: Pressure Ulcer Wound Location: Left Lower Leg Wound Status: Open Wounding Event: Gradually Appeared Date Acquired: 04/28/2017 [name] Of Treatment: 6 Clustered Wound: No Photos Photo Uploaded By: Jessica [name] on 07/09/2017 16:24:08 Wound Measurements Length: (cm) 2.5 Width: (cm) 1.8 Depth: (cm) 0.1 Area: (cm) 3.534 Volume: (cm) 0.353 % Reduction in Area: -650.3% %  Reduction in Volume: -275.5% Wound Description Classification: Category/Stage III Periwound Skin Texture Texture Color No Abnormalities Noted: No No Abnormalities Noted: No Moisture No Abnormalities Noted: No Treatment Notes Wound #2 (Left Lower Leg) 1. Cleansed with: Clean wound with Normal Saline 2. Anesthetic Topical Lidocaine 4% cream to wound bed prior to debridement 3. Peri-wound Care: Jessica [name], Jessica [name] ([id number]) Skin Prep 4. Dressing Applied: Prisma Ag 5. Secondary Dressing Applied Bordered Foam Dressing Electronic Signature(s) Signed: 07/09/2017 4:26:13 PM By: Jessica [name] Entered By: Jessica [name] on 07/09/2017 14:10:04 Jessica [name] ([id number]) -------------------------------------------------------------------------------- Wound Assessment Details [name]. Date of Service: 07/09/2017 2:15 PM Medical Record Number: [id number] Patient Account Number: [id number] Date of Birth/Sex: Nov 09, 1945 (72 y.o. F) Treating RN: Jessica [name] Primary Care Zyionna Pesce: Jessica [name] Other Clinician: Referring Carey Lafon: Jessica [name] Treating Voncille Simm/Extender: STONE III, Jessica [name] in Treatment: 6 Wound Status Wound Number: 3 Primary Pressure Ulcer Etiology: Wound Location: Right Lower Leg - Lateral Wound Status: Open Wounding Event: Gradually Appeared Comorbid Anemia, Lymphedema, Deep Vein Date Acquired: 04/28/2017 History: Thrombosis, Quadriplegia [name] Of Treatment: 6 Clustered Wound: No Photos Photo Uploaded By: Jessica [name] on 07/09/2017 16:24:24 Wound Measurements Length: (cm) 0.5 Width: (cm) 0.4 Depth: (cm) 0.1 Area: (cm) 0.157 Volume: (cm) 0.016 % Reduction in Area: 44.5% % Reduction in Volume: 42.9% Epithelialization: Large (67-100%) Tunneling: No Undermining: No Wound Description Classification: Category/Stage II Wound Margin: Flat and Intact Exudate Amount: None Present Foul Odor After Cleansing:  No Slough/Fibrino Yes Wound Bed Granulation Amount: Medium (34-66%) Exposed Structure Granulation Quality: Red Fascia Exposed: No Necrotic Amount: Medium (34-66%) Fat Layer (Subcutaneous Tissue) Exposed: No Necrotic Quality: Eschar Tendon Exposed: No Muscle Exposed: No Joint Exposed: No Bone Exposed: No Periwound Skin Texture Texture Color No Abnormalities Noted: No No Abnormalities Noted: No Jessica [name], KOBEL. ([id number]) Callus: No Atrophie Blanche: No Crepitus: No Cyanosis: No Excoriation: No Ecchymosis: No Induration: No Erythema: No Rash: No Hemosiderin Staining: No Scarring: No Mottled: No Pallor: No Moisture Rubor: No No Abnormalities Noted: No Dry / Scaly: No Temperature / Pain Maceration: No Temperature: No Abnormality Tenderness on Palpation: Yes Wound Preparation Ulcer Cleansing: Rinsed/Irrigated with Saline Topical Anesthetic Applied: Other: LIDOCAINE 4%, Treatment Notes Wound #3 (Right, Lateral Lower Leg) 1. Cleansed with: Clean wound with Normal Saline 2. Anesthetic Topical Lidocaine 4% cream to wound bed prior to debridement 3. Peri-wound Care: Skin Prep 4. Dressing Applied: Prisma Ag 5. Secondary Dressing Applied Bordered Foam Dressing Electronic Signature(s) Signed: 07/09/2017 4:26:13 PM By: Jessica [name] Entered  By: Jessica CriglerFlinchum, Cheryl on 07/09/2017 14:15:28 Jessica [name], Jessica [name]. ([id number]) -------------------------------------------------------------------------------- Vitals Details [name], Jessica [name]. Date of Service: 07/09/2017 2:15 PM Medical Record Number: [id number] Patient Account Number: [id number] Date of Birth/Sex: 04/24/1945 (72 y.o. F) Treating RN: Jessica CriglerFlinchum, Cheryl Primary Care Rehaan Viloria: Jessica BrodASENSO, PHILIP Other Clinician: Referring Shavonn Convey: Jessica BrodASENSO, PHILIP Treating Elvert Cumpton/Extender: STONE III, Jessica [name] in Treatment: 6 Vital Signs Time Taken: 14:05 Temperature (F): 97.6 Pulse (bpm): 93 Respiratory Rate  (breaths/min): 16 Blood Pressure (mmHg): 122/79 Reference Range: 80 - 120 mg / dl Electronic Signature(s) Signed: 07/09/2017 4:26:13 PM By: Jessica CriglerFlinchum, Cheryl Entered By: Jessica CriglerFlinchum, Cheryl on 07/09/2017 14:05:12

## 2017-07-12 ENCOUNTER — Ambulatory Visit
Admission: RE | Admit: 2017-07-12 | Discharge: 2017-07-12 | Disposition: A | Discharge status: Home / Self Care | Payer: Medicare Other | Source: Ambulatory Visit | Attending: Physician Assistant | Admitting: Physician Assistant

## 2017-07-12 ENCOUNTER — Other Ambulatory Visit: Payer: Self-pay | Admitting: Radiology

## 2017-07-12 DIAGNOSIS — L89154 Pressure ulcer of sacral region, stage 4: Secondary | ICD-10-CM

## 2017-07-12 DIAGNOSIS — M869 Osteomyelitis, unspecified: Secondary | ICD-10-CM | POA: Diagnosis not present

## 2017-07-12 DIAGNOSIS — L98496 Non-pressure chronic ulcer of skin of other sites with bone involvement without evidence of necrosis: Secondary | ICD-10-CM | POA: Diagnosis present

## 2017-07-12 DIAGNOSIS — L89159 Pressure ulcer of sacral region, unspecified stage: Secondary | ICD-10-CM | POA: Insufficient documentation

## 2017-07-13 ENCOUNTER — Telehealth: Payer: Self-pay | Admitting: Radiology

## 2017-07-13 NOTE — Telephone Encounter (Signed)
-----   Message from Vanna ScotlandAshley [name], MD sent at 07/12/2017  1:31 PM EDT ----- Regarding: IR referral I put in a referral in December for interventional radiology for this patient.  I recently got a bounce back message for this order.  I should have CC: you the note way back when for you to arrange rather than putting in the referral order.  Work flow issue (ie why is there a referral order when this is not the way to do it??!!)  Can you arrange this?  Vanna ScotlandAshley [name], MD

## 2017-07-13 NOTE — Telephone Encounter (Signed)
Spoke with son regarding appt with interventional radiology. Son states pt currently has pressure wound that has caused need for ambulance transport to appointments. [place], son would like to wait until wound has healed before making appointment. [facility] to [facility] given to son - [phone].

## 2017-07-14 NOTE — Telephone Encounter (Signed)
Explained Dr Delana MeyerBrandon's concern to son. Son voices understanding & states [facility] to reschedule consult appointment to a better time for the family.

## 2017-07-14 NOTE — Telephone Encounter (Signed)
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  Would not want to wait much longer for her to be seen and evaluated, her last imaging is been now almost 6 months ago.  I would strongly recommend being seen and evaluated in the near future unless the sacral wound is something that looks like it is going to resolve in the next month.  Jessica ScotlandAshley Nalina Yeatman, MD

## 2017-07-16 ENCOUNTER — Encounter: Payer: Medicare Other | Attending: Physician Assistant | Admitting: Physician Assistant

## 2017-07-16 DIAGNOSIS — G894 Chronic pain syndrome: Secondary | ICD-10-CM | POA: Diagnosis not present

## 2017-07-16 DIAGNOSIS — E11622 Type 2 diabetes mellitus with other skin ulcer: Secondary | ICD-10-CM | POA: Insufficient documentation

## 2017-07-16 DIAGNOSIS — G8250 Quadriplegia, unspecified: Secondary | ICD-10-CM | POA: Diagnosis not present

## 2017-07-16 DIAGNOSIS — D649 Anemia, unspecified: Secondary | ICD-10-CM | POA: Diagnosis not present

## 2017-07-16 DIAGNOSIS — Z836 Family history of other diseases of the respiratory system: Secondary | ICD-10-CM | POA: Diagnosis not present

## 2017-07-16 DIAGNOSIS — Z86718 Personal history of other venous thrombosis and embolism: Secondary | ICD-10-CM | POA: Insufficient documentation

## 2017-07-16 DIAGNOSIS — L89899 Pressure ulcer of other site, unspecified stage: Secondary | ICD-10-CM | POA: Insufficient documentation

## 2017-07-16 DIAGNOSIS — L89154 Pressure ulcer of sacral region, stage 4: Secondary | ICD-10-CM | POA: Insufficient documentation

## 2017-07-16 DIAGNOSIS — E11621 Type 2 diabetes mellitus with foot ulcer: Secondary | ICD-10-CM | POA: Diagnosis present

## 2017-07-16 DIAGNOSIS — M4800 Spinal stenosis, site unspecified: Secondary | ICD-10-CM | POA: Insufficient documentation

## 2017-07-16 DIAGNOSIS — Z7401 Bed confinement status: Secondary | ICD-10-CM | POA: Diagnosis not present

## 2017-07-16 DIAGNOSIS — E782 Mixed hyperlipidemia: Secondary | ICD-10-CM | POA: Diagnosis not present

## 2017-07-16 DIAGNOSIS — Z8249 Family history of ischemic heart disease and other diseases of the circulatory system: Secondary | ICD-10-CM | POA: Insufficient documentation

## 2017-07-20 NOTE — Progress Notes (Signed)
Jessica [name], Jessica [name] ([id number]) Visit Report for 07/16/2017 Arrival Information Details [name], Jessica [name]. Date of Service: 07/16/2017 2:00 PM Medical Record Number: [id number] Patient Account Number: [id number] Date of Birth/Sex: Apr 29, 1945 (72 y.o. F) Treating RN: Huel Coventry Primary Care Bashir Marchetti: Annita Brod Other Clinician: Referring Alessa Mazur: Annita Brod Treating Broc Caspers/Extender: Linwood Dibbles, HOYT Weeks in Treatment: 7 Visit Information History Since Last Visit Added or deleted any medications: No Patient Arrived: Ambulatory Any new allergies or adverse reactions: No Arrival Time: 13:31 Had a fall or experienced change in No Accompanied By: son activities of daily living that may affect Transfer Assistance: Stretcher risk of falls: Patient Identification Verified: Yes Signs or symptoms of abuse/neglect since last visito No Secondary Verification Process Completed: Yes Hospitalized since last visit: No Patient Has Alerts: Yes Implantable device outside of the clinic excluding No Patient Alerts: allergic to zinc cellular tissue based products placed in the center since last visit: Has Dressing in Place as Prescribed: Yes Pain Present Now: No Electronic Signature(s) Signed: 07/19/2017 9:08:37 AM By: Elliot Gurney, BSN, RN, CWS, Kim RN, BSN Entered By: Elliot Gurney, BSN, RN, CWS, Kim on 07/16/2017 14:01:41 Jessica [name] ([id number]) -------------------------------------------------------------------------------- Clinic Level of Care Assessment Details [name]. Date of Service: 07/16/2017 2:00 PM Medical Record Number: [id number] Patient Account Number: [id number] Date of Birth/Sex: 25-Feb-1946 (72 y.o. F) Treating RN: Curtis Sites Primary Care Shina Wass: Annita Brod Other Clinician: Referring Ouida Abeyta: Annita Brod Treating Jannessa Ogden/Extender: STONE III, HOYT Weeks in Treatment: 7 Clinic Level of Care Assessment Items TOOL 4 Quantity  Score []  - Use when only an EandM is performed on FOLLOW-UP visit 0 ASSESSMENTS - Nursing Assessment / Reassessment X - Reassessment of Co-morbidities (includes updates in patient status) 1 10 X- 1 5 Reassessment of Adherence to Treatment Plan ASSESSMENTS - Wound and Skin Assessment / Reassessment []  - Simple Wound Assessment / Reassessment - one wound 0 X- 3 5 Complex Wound Assessment / Reassessment - multiple wounds []  - 0 Dermatologic / Skin Assessment (not related to wound area) ASSESSMENTS - Focused Assessment []  - Circumferential Edema Measurements - multi extremities 0 []  - 0 Nutritional Assessment / Counseling / Intervention []  - 0 Lower Extremity Assessment (monofilament, tuning fork, pulses) []  - 0 Peripheral Arterial Disease Assessment (using hand held doppler) ASSESSMENTS - Ostomy and/or Continence Assessment and Care []  - Incontinence Assessment and Management 0 []  - 0 Ostomy Care Assessment and Management (repouching, etc.) PROCESS - Coordination of Care X - Simple Patient / Family Education for ongoing care 1 15 []  - 0 Complex (extensive) Patient / Family Education for ongoing care []  - 0 Staff obtains Chiropractor, Records, Test Results / Process Orders []  - 0 Staff telephones HHA, Nursing Homes / Clarify orders / etc []  - 0 Routine Transfer to another Facility (non-emergent condition) []  - 0 Routine Hospital Admission (non-emergent condition) []  - 0 New Admissions / Manufacturing engineer / Ordering NPWT, Apligraf, etc. []  - 0 Emergency Hospital Admission (emergent condition) X- 1 10 Simple Discharge Coordination Jessica [name], Jessica [name]. ([id number]) []  - 0 Complex (extensive) Discharge Coordination PROCESS - Special Needs []  - Pediatric / Minor Patient Management 0 []  - 0 Isolation Patient Management []  - 0 Hearing / Language / Visual special needs []  - 0 Assessment of Community assistance (transportation, D/C planning, etc.) []  - 0 Additional  assistance / Altered mentation []  - 0 Support Surface(s) Assessment (bed, cushion, seat, etc.) INTERVENTIONS - Wound Cleansing / Measurement []  - Simple Wound Cleansing - one wound  0 X- 3 5 Complex Wound Cleansing - multiple wounds X- 1 5 Wound Imaging (photographs - any number of wounds) []  - 0 Wound Tracing (instead of photographs) []  - 0 Simple Wound Measurement - one wound X- 3 5 Complex Wound Measurement - multiple wounds INTERVENTIONS - Wound Dressings X - Small Wound Dressing one or multiple wounds 3 10 []  - 0 Medium Wound Dressing one or multiple wounds []  - 0 Large Wound Dressing one or multiple wounds []  - 0 Application of Medications - topical []  - 0 Application of Medications - injection INTERVENTIONS - Miscellaneous []  - External ear exam 0 []  - 0 Specimen Collection (cultures, biopsies, blood, body fluids, etc.) []  - 0 Specimen(s) / Culture(s) sent or taken to Lab for analysis []  - 0 Patient Transfer (multiple staff / Nurse, adult / Similar devices) []  - 0 Simple Staple / Suture removal (25 or less) []  - 0 Complex Staple / Suture removal (26 or more) []  - 0 Hypo / Hyperglycemic Management (close monitor of Blood Glucose) []  - 0 Ankle / Brachial Index (ABI) - do not check if billed separately X- 1 5 Vital Signs Jessica [name], [name] ([id number]) Has the patient been seen at the hospital within the last three years: Yes Total Score: 125 Level Of Care: New/Established - Level 4 Electronic Signature(s) Signed: 07/16/2017 3:34:53 PM By: Curtis Sites Entered By: Curtis Sites on 07/16/2017 15:31:52 Jessica [name] ([id number]) -------------------------------------------------------------------------------- Encounter Discharge Information Details [name]. Date of Service: 07/16/2017 2:00 PM Medical Record Number: [id number] Patient Account Number: [id number] Date of Birth/Sex: [birth year] (72 y.o. F) Treating RN: Curtis Sites Primary Care Little Bashore: Annita Brod Other Clinician: Referring Kawthar Ennen: Annita Brod Treating Deretha Ertle/Extender: Linwood Dibbles, HOYT Weeks in Treatment: 7 Encounter Discharge Information Items Discharge Pain Level: 0 Discharge Condition: Stable Ambulatory Status: Stretcher Discharge Destination: Home Transportation: Ambulance Accompanied By: son Schedule Follow-up Appointment: Yes Medication Reconciliation completed and No provided to Patient/Care Mishelle Hassan: Provided on Clinical Summary of Care: 07/16/2017 Form Type Recipient Paper Patient PO Electronic Signature(s) Signed: 07/16/2017 3:46:02 PM By: Alejandro Mulling Entered By: Alejandro Mulling on 07/16/2017 15:46:02 Jessica [name] ([id number]) -------------------------------------------------------------------------------- Lower Extremity Assessment Details [name]. Date of Service: 07/16/2017 2:00 PM Medical Record Number: [id number] Patient Account Number: [id number] Date of Birth/Sex: [birth year] (72 y.o. F) Treating RN: Huel Coventry Primary Care Dehaven Sine: Annita Brod Other Clinician: Referring Kajol Crispen: Annita Brod Treating Ericah Scotto/Extender: STONE III, HOYT Weeks in Treatment: 7 Vascular Assessment Pulses: Dorsalis Pedis Palpable: [Left:Yes] [Right:Yes] Posterior Tibial Extremity colors, hair growth, and conditions: Extremity Color: [Left:Normal] [Right:Normal] Hair Growth on Extremity: [Left:Yes] [Right:Yes] Temperature of Extremity: [Left:Warm] [Right:Warm] Capillary Refill: [Left:< 3 seconds] [Right:< 3 seconds] Toe Nail Assessment Left: Right: Thick: Yes Yes Discolored: Yes Yes Deformed: Yes Yes Improper Length and Hygiene: Yes Yes Electronic Signature(s) Signed: 07/19/2017 9:08:37 AM By: Elliot Gurney, BSN, RN, CWS, Kim RN, BSN Entered By: Elliot Gurney, BSN, RN, CWS, Kim on 07/16/2017 14:18:44 Jessica [name]  ([id number]) -------------------------------------------------------------------------------- Multi Wound Chart Details [name]. Date of Service: 07/16/2017 2:00 PM Medical Record Number: [id number] Patient Account Number: [id number] Date of Birth/Sex: 03-03-1946 (72 y.o. F) Treating RN: Curtis Sites Primary Care Izetta Sakamoto: Annita Brod Other Clinician: Referring Rhen Dossantos: Annita Brod Treating Alyze Lauf/Extender: STONE III, HOYT Weeks in Treatment: 7 Vital Signs Height(in): 70 Pulse(bpm): 78 Weight(lbs): Blood Pressure(mmHg): 112/59 Body Mass Index(BMI): Temperature(F): 98.1 Respiratory Rate 16 (breaths/min): Photos: [1:No Photos] [2:No Photos] [3:No Photos] Wound Location: [1:Sacrum - Medial] [2:Left  Lower Leg] [3:Right Lower Leg - Lateral] Wounding Event: [1:Pressure Injury] [2:Gradually Appeared] [3:Gradually Appeared] Primary Etiology: [1:Pressure Ulcer] [2:Pressure Ulcer] [3:Pressure Ulcer] Comorbid History: [1:Anemia, Lymphedema, Deep Anemia, Lymphedema, Deep Anemia, Lymphedema, Deep Vein Thrombosis, Quadriplegia Vein Thrombosis, Quadriplegia Vein Thrombosis, Quadriplegia] Date Acquired: [1:01/15/2017] [2:04/28/2017] [3:04/28/2017] Weeks of Treatment: [1:7] [2:7] [3:7] Wound Status: [1:Open] [2:Open] [3:Open] Measurements L x W x D [1:8.5x6.2x2.4] [2:2.5x2.5x0.1] [3:0.6x0.3x0.1] (cm) Area (cm) : [1:41.39] [2:4.909] [3:0.141] Volume (cm) : [1:99.337] [2:0.491] [3:0.014] % Reduction in Area: [1:-12.30%] [2:-942.30%] [3:50.20%] % Reduction in Volume: [1:-169.60%] [2:-422.30%] [3:50.00%] Starting Position 1 [1:8] (o'clock): Ending Position 1 [1:2] (o'clock): Maximum Distance 1 (cm): [1:2] Undermining: [1:Yes] [2:No] [3:No] Classification: [1:Category/Stage IV] [2:Category/Stage III] [3:Category/Stage II] Exudate Amount: [1:Large] [2:Small] [3:None Present] Exudate Type: [1:Serous] [2:Serous] [3:N/A] Exudate Color: [1:amber] [2:amber]  [3:N/A] Foul Odor After Cleansing: [1:Yes] [2:No] [3:No] Odor Anticipated Due to [1:No] [2:N/A] [3:N/A] Product Use: Wound Margin: [1:Distinct, outline attached] [2:Flat and Intact] [3:Flat and Intact] Granulation Amount: [1:Large (67-100%)] [2:None Present (0%)] [3:None Present (0%)] Granulation Quality: [1:Red, Hyper-granulation] [2:N/A] [3:N/A] Necrotic Amount: [1:Small (1-33%)] [2:Large (67-100%)] [3:Large (67-100%)] Necrotic Tissue: [1:Eschar, Adherent Slough] [2:Eschar] [3:Eschar] Exposed Structures: [1:Fat Layer (Subcutaneous Tissue) Exposed: Yes Muscle: Yes Bone: Yes] [2:Fat Layer (Subcutaneous Tissue) Exposed: Yes Fascia: No Tendon: No] [3:Fascia: No Fat Layer (Subcutaneous Tissue) Exposed: No Tendon: No] Fascia: No Muscle: No Muscle: No Tendon: No Joint: No Joint: No Joint: No Bone: No Bone: No Epithelialization: None None Large (67-100%) Periwound Skin Texture: Induration: Yes No Abnormalities Noted Excoriation: No Scarring: Yes Induration: No Excoriation: No Callus: No Callus: No Crepitus: No Crepitus: No Rash: No Rash: No Scarring: No Periwound Skin Moisture: Maceration: No No Abnormalities Noted Maceration: No Dry/Scaly: No Dry/Scaly: No Periwound Skin Color: Erythema: Yes Erythema: Yes Atrophie Blanche: No Atrophie Blanche: No Cyanosis: No Cyanosis: No Ecchymosis: No Ecchymosis: No Erythema: No Hemosiderin Staining: No Hemosiderin Staining: No Mottled: No Mottled: No Pallor: No Pallor: No Rubor: No Rubor: No Erythema Location: Circumferential Circumferential N/A Temperature: No Abnormality N/A No Abnormality Tenderness on Palpation: Yes No Yes Wound Preparation: Ulcer Cleansing: Ulcer Cleansing: Ulcer Cleansing: Rinsed/Irrigated with Saline Rinsed/Irrigated with Saline Rinsed/Irrigated with Saline Topical Anesthetic Applied: Topical Anesthetic Applied: Topical Anesthetic Applied: Other: lidocaine 4% Other: lidocaine 4% Other:  LIDOCAINE 4% Treatment Notes Electronic Signature(s) Signed: 07/16/2017 3:34:53 PM By: Curtis Sites Entered By: Curtis Sites on 07/16/2017 15:16:17 Jessica [name] ([id number]) -------------------------------------------------------------------------------- Multi-Disciplinary Care Plan Details [name]. Date of Service: 07/16/2017 2:00 PM Medical Record Number: [id number] Patient Account Number: [id number] Date of Birth/Sex: Dec 14, 1945 (72 y.o. F) Treating RN: Curtis Sites Primary Care Deshawn Witty: Annita Brod Other Clinician: Referring Zineb Glade: Annita Brod Treating Yukiko Minnich/Extender: STONE III, HOYT Weeks in Treatment: 7 Active Inactive ` Orientation to the Wound Care Program Nursing Diagnoses: Knowledge deficit related to the wound healing center program Goals: Patient/caregiver will verbalize understanding of the Wound Healing Center Program Date Initiated: 06/03/2017 Target Resolution Date: 06/17/2017 Goal Status: Active Interventions: Provide education on orientation to the wound center Notes: ` Pressure Nursing Diagnoses: Knowledge deficit related to causes and risk factors for pressure ulcer development Knowledge deficit related to management of pressures ulcers Potential for impaired tissue integrity related to pressure, friction, moisture, and shear Goals: Patient will remain free from development of additional pressure ulcers Date Initiated: 06/03/2017 Target Resolution Date: 06/17/2017 Goal Status: Active Patient will remain free of pressure ulcers Date Initiated: 06/03/2017 Target Resolution Date: 06/17/2017 Goal Status: Active Patient/caregiver will verbalize risk factors for pressure ulcer development  Date Initiated: 06/03/2017 Target Resolution Date: 06/17/2017 Goal Status: Active Interventions: Assess: immobility, friction, shearing, incontinence upon admission and as needed Assess potential for pressure ulcer upon admission and as  needed Notes: ` Wound/Skin Impairment Jessica BradyOVERMAN, [name] ([id number]) Nursing Diagnoses: Impaired tissue integrity Goals: Patient/caregiver will verbalize understanding of skin care regimen Date Initiated: 06/03/2017 Target Resolution Date: 06/17/2017 Goal Status: Active Ulcer/skin breakdown will have a volume reduction of 30% by week 4 Date Initiated: 06/03/2017 Target Resolution Date: 06/17/2017 Goal Status: Active Interventions: Assess ulceration(s) every visit Treatment Activities: Patient referred to home care : 06/03/2017 Skin care regimen initiated : 06/03/2017 Notes: Electronic Signature(s) Signed: 07/16/2017 3:34:53 PM By: Curtis Sitesorthy, Joanna Entered By: Curtis Sitesorthy, Joanna on 07/16/2017 15:15:45 Jessica [name], [place] W. ([id number]) -------------------------------------------------------------------------------- Pain Assessment Details [name], [name] Date of Service: 07/16/2017 2:00 PM Medical Record Number: [id number] Patient Account Number: [id number] Date of Birth/Sex: 03-14-1946 (72 y.o. F) Treating RN: Huel CoventryWoody, Kim Primary Care Mikyah Alamo: Annita BrodASENSO, PHILIP Other Clinician: Referring Shallyn Constancio: Annita BrodASENSO, PHILIP Treating Yamila Cragin/Extender: Linwood DibblesSTONE III, HOYT Weeks in Treatment: 7 Active Problems Location of Pain Severity and Description of Pain Patient Has Paino No Site Locations With Dressing Change: Yes Pain Management and Medication Current Pain Management: Electronic Signature(s) Signed: 07/19/2017 9:08:37 AM By: Elliot GurneyWoody, BSN, RN, CWS, Kim RN, BSN Entered By: Elliot GurneyWoody, BSN, RN, CWS, Kim on 07/16/2017 14:01:55 Jessica [name], [name] ([id number]) -------------------------------------------------------------------------------- Patient/Caregiver Education Details [name], [name] Date of Service: 07/16/2017 2:00 PM Medical Record Number: [id number] Patient Account Number: [id number] Date of Birth/Gender: 03-14-1946 (72 y.o. F) Treating RN: Phillis HaggisPinkerton, Debi Primary Care  Physician: Annita BrodASENSO, PHILIP Other Clinician: Referring Physician: Annita BrodASENSO, PHILIP Treating Physician/Extender: Linwood DibblesSTONE III, HOYT Weeks in Treatment: 7 Education Assessment Education Provided To: Patient Education Topics Provided Wound/Skin Impairment: Handouts: Caring for Your Ulcer, Other: change dressing as ordered Methods: Demonstration, Explain/Verbal Responses: State content correctly Electronic Signature(s) Signed: 07/19/2017 4:32:12 PM By: Alejandro MullingPinkerton, Debra Entered By: Alejandro MullingPinkerton, Debra on 07/16/2017 15:46:18 Jessica [name], [name] ([id number]) -------------------------------------------------------------------------------- Wound Assessment Details [name], [name] Date of Service: 07/16/2017 2:00 PM Medical Record Number: [id number] Patient Account Number: [id number] Date of Birth/Sex: 03-14-1946 (72 y.o. F) Treating RN: Huel CoventryWoody, Kim Primary Care Deaunte Dente: Annita BrodASENSO, PHILIP Other Clinician: Referring Lexy Meininger: Annita BrodASENSO, PHILIP Treating Carleigh Buccieri/Extender: STONE III, HOYT Weeks in Treatment: 7 Wound Status Wound Number: 1 Primary Pressure Ulcer Etiology: Wound Location: Sacrum - Medial Wound Status: Open Wounding Event: Pressure Injury Comorbid Anemia, Lymphedema, Deep Vein Date Acquired: 01/15/2017 History: Thrombosis, Quadriplegia Weeks Of Treatment: 7 Clustered Wound: No Photos Photo Uploaded By: Renne CriglerFlinchum, Cheryl on 07/16/2017 16:07:06 Wound Measurements Length: (cm) 8.5 Width: (cm) 6.2 Depth: (cm) 2.4 Area: (cm) 41.39 Volume: (cm) 99.337 % Reduction in Area: -12.3% % Reduction in Volume: -169.6% Epithelialization: None Tunneling: No Undermining: Yes Starting Position (o'clock): 8 Ending Position (o'clock): 2 Maximum Distance: (cm) 2 Wound Description Classification: Category/Stage IV Wound Margin: Distinct, outline attached Exudate Amount: Large Exudate Type: Serous Exudate Color: amber Foul Odor After Cleansing: Yes Due to Product Use:  No Slough/Fibrino Yes Wound Bed Granulation Amount: Large (67-100%) Exposed Structure Granulation Quality: Red, Hyper-granulation Fascia Exposed: No Necrotic Amount: Small (1-33%) Fat Layer (Subcutaneous Tissue) Exposed: Yes Necrotic Quality: Eschar, Adherent Slough Tendon Exposed: No Muscle Exposed: Yes Jessica BradyOVERMAN, Turquoise W. ([id number]) Necrosis of Muscle: No Joint Exposed: No Bone Exposed: Yes Periwound Skin Texture Texture Color No Abnormalities Noted: No No Abnormalities Noted: No Callus: No Atrophie Blanche: No Crepitus: No Cyanosis: No Excoriation: No Ecchymosis: No Induration: Yes Erythema: Yes Rash: No  Erythema Location: Circumferential Scarring: Yes Hemosiderin Staining: No Mottled: No Moisture Pallor: No No Abnormalities Noted: No Rubor: No Dry / Scaly: No Maceration: No Temperature / Pain Temperature: No Abnormality Tenderness on Palpation: Yes Wound Preparation Ulcer Cleansing: Rinsed/Irrigated with Saline Topical Anesthetic Applied: Other: lidocaine 4%, Treatment Notes Wound #1 (Medial Sacrum) 1. Cleansed with: Clean wound with Normal Saline 2. Anesthetic Topical Lidocaine 4% cream to wound bed prior to debridement 3. Peri-wound Care: Skin Prep 4. Dressing Applied: Other dressing (specify in notes) 5. Secondary Dressing Applied ABD Pad Dry Gauze 7. Secured with Tape Notes Vashe wet gauze, xsorb, abd Electronic Signature(s) Signed: 07/19/2017 9:08:37 AM By: Elliot Gurney, BSN, RN, CWS, Kim RN, BSN Entered By: Elliot Gurney, BSN, RN, CWS, Kim on 07/16/2017 14:17:23 Jessica [name] ([id number]) -------------------------------------------------------------------------------- Wound Assessment Details [name]. Date of Service: 07/16/2017 2:00 PM Medical Record Number: [id number] Patient Account Number: [id number] Date of Birth/Sex: 07/06/1945 (72 y.o. F) Treating RN: Huel Coventry Primary Care Titus Drone: Annita Brod Other  Clinician: Referring Junice Fei: Annita Brod Treating Azarie Coriz/Extender: STONE III, HOYT Weeks in Treatment: 7 Wound Status Wound Number: 2 Primary Pressure Ulcer Etiology: Wound Location: Left Lower Leg Wound Status: Open Wounding Event: Gradually Appeared Comorbid Anemia, Lymphedema, Deep Vein Date Acquired: 04/28/2017 History: Thrombosis, Quadriplegia Weeks Of Treatment: 7 Clustered Wound: No Photos Photo Uploaded By: Renne Crigler on 07/16/2017 16:10:18 Wound Measurements Length: (cm) 2.5 Width: (cm) 2.5 Depth: (cm) 0.1 Area: (cm) 4.909 Volume: (cm) 0.491 % Reduction in Area: -942.3% % Reduction in Volume: -422.3% Epithelialization: None Tunneling: No Undermining: No Wound Description Classification: Category/Stage III Wound Margin: Flat and Intact Exudate Amount: Small Exudate Type: Serous Exudate Color: amber Foul Odor After Cleansing: No Slough/Fibrino Yes Wound Bed Granulation Amount: None Present (0%) Exposed Structure Necrotic Amount: Large (67-100%) Fascia Exposed: No Necrotic Quality: Eschar Fat Layer (Subcutaneous Tissue) Exposed: Yes Tendon Exposed: No Muscle Exposed: No Joint Exposed: No Bone Exposed: No Periwound Skin Texture Mckee, Early W. ([id number]) Texture Color No Abnormalities Noted: No No Abnormalities Noted: No Erythema: Yes Moisture Erythema Location: Circumferential No Abnormalities Noted: No Wound Preparation Ulcer Cleansing: Rinsed/Irrigated with Saline Topical Anesthetic Applied: Other: lidocaine 4%, Treatment Notes Wound #2 (Left Lower Leg) 1. Cleansed with: Clean wound with Normal Saline 2. Anesthetic Topical Lidocaine 4% cream to wound bed prior to debridement 3. Peri-wound Care: Skin Prep 4. Dressing Applied: Santyl Ointment 5. Secondary Dressing Applied ABD Pad Dry Gauze Saline moistened guaze 7. Secured with Secretary/administrator) Signed: 07/19/2017 9:08:37 AM By: Elliot Gurney, BSN, RN, CWS, Kim  RN, BSN Entered By: Elliot Gurney, BSN, RN, CWS, Kim on 07/16/2017 14:13:05 Jessica [name] ([id number]) -------------------------------------------------------------------------------- Wound Assessment Details [name]. Date of Service: 07/16/2017 2:00 PM Medical Record Number: [id number] Patient Account Number: [id number] Date of Birth/Sex: March 26, 1946 (72 y.o. F) Treating RN: Huel Coventry Primary Care Genaro Bekker: Annita Brod Other Clinician: Referring Mahagony Grieb: Annita Brod Treating Cinque Begley/Extender: STONE III, HOYT Weeks in Treatment: 7 Wound Status Wound Number: 3 Primary Pressure Ulcer Etiology: Wound Location: Right Lower Leg - Lateral Wound Status: Open Wounding Event: Gradually Appeared Comorbid Anemia, Lymphedema, Deep Vein Date Acquired: 04/28/2017 History: Thrombosis, Quadriplegia Weeks Of Treatment: 7 Clustered Wound: No Photos Photo Uploaded By: Renne Crigler on 07/16/2017 16:08:12 Wound Measurements Length: (cm) 0.6 Width: (cm) 0.3 Depth: (cm) 0.1 Area: (cm) 0.141 Volume: (cm) 0.014 % Reduction in Area: 50.2% % Reduction in Volume: 50% Epithelialization: Large (67-100%) Tunneling: No Undermining: No Wound Description Classification: Category/Stage II Wound Margin: Flat and  Intact Exudate Amount: None Present Foul Odor After Cleansing: No Slough/Fibrino No Wound Bed Granulation Amount: None Present (0%) Exposed Structure Necrotic Amount: Large (67-100%) Fascia Exposed: No Necrotic Quality: Eschar Fat Layer (Subcutaneous Tissue) Exposed: No Tendon Exposed: No Muscle Exposed: No Joint Exposed: No Bone Exposed: No Periwound Skin Texture Texture Color No Abnormalities Noted: No No Abnormalities Noted: No MAIRE, GOVAN. ([id number]) Callus: No Atrophie Blanche: No Crepitus: No Cyanosis: No Excoriation: No Ecchymosis: No Induration: No Erythema: No Rash: No Hemosiderin Staining: No Scarring: No Mottled:  No Pallor: No Moisture Rubor: No No Abnormalities Noted: No Dry / Scaly: No Temperature / Pain Maceration: No Temperature: No Abnormality Tenderness on Palpation: Yes Wound Preparation Ulcer Cleansing: Rinsed/Irrigated with Saline Topical Anesthetic Applied: Other: LIDOCAINE 4%, Treatment Notes Wound #3 (Right, Lateral Lower Leg) 1. Cleansed with: Clean wound with Normal Saline 2. Anesthetic Topical Lidocaine 4% cream to wound bed prior to debridement 3. Peri-wound Care: Skin Prep 5. Secondary Dressing Applied Bordered Foam Dressing Electronic Signature(s) Signed: 07/19/2017 9:08:37 AM By: Elliot Gurney, BSN, RN, CWS, Kim RN, BSN Entered By: Elliot Gurney, BSN, RN, CWS, Kim on 07/16/2017 14:11:42 Jessica [name] ([id number]) -------------------------------------------------------------------------------- Vitals Details [name]. Date of Service: 07/16/2017 2:00 PM Medical Record Number: [id number] Patient Account Number: [id number] Date of Birth/Sex: 02-07-1946 (72 y.o. F) Treating RN: Huel Coventry Primary [facility] Barnes Florek: Annita Brod Other Clinician: Referring Chloe Baig: Annita Brod Treating Asna Muldrow/Extender: STONE III, HOYT Weeks in Treatment: 7 Vital Signs Time Taken: 14:01 Temperature (F): 98.1 Height (in): 70 Pulse (bpm): 78 Source: Stated Respiratory Rate (breaths/min): 16 Unable to obtain height and weight: Medical Reason Blood Pressure (mmHg): 112/59 Reference Range: 80 - 120 mg / dl Electronic Signature(s) Signed: 07/19/2017 9:08:37 AM By: Elliot Gurney, BSN, RN, CWS, Kim RN, BSN Entered By: Elliot Gurney, BSN, RN, CWS, Kim on 07/16/2017 14:05:43

## 2017-07-20 NOTE — Progress Notes (Signed)
NAMIAH, DUNNAVANT ([id number]) Visit Report for 07/16/2017 Chief Complaint Document Details [name], Jessica [name]. Date of Service: 07/16/2017 2:00 PM Medical Record Number: [id number] Patient Account Number: [id number] Date of Birth/Sex: [birth year] (72 y.o. F) Treating RN: Curtis Sites Primary Care Provider: Annita Brod Other Clinician: Referring Provider: Annita Brod Treating Provider/Extender: Linwood Dibbles, HOYT Weeks in Treatment: 7 Information Obtained from: Patient Chief Complaint she is here for evaluation of a sacral ulcer and bilateral lower extremity ulcers Electronic Signature(s) Signed: 07/16/2017 5:57:55 PM By: Lenda Kelp PA-C Entered By: Lenda Kelp on 07/16/2017 14:24:33 Jessica [name] ([id number]) -------------------------------------------------------------------------------- HPI Details [name]. Date of Service: 07/16/2017 2:00 PM Medical Record Number: [id number] Patient Account Number: [id number] Date of Birth/Sex: 1945-12-31 (72 y.o. F) Treating RN: Curtis Sites Primary Care Provider: Annita Brod Other Clinician: Referring Provider: Annita Brod Treating Provider/Extender: Linwood Dibbles, HOYT Weeks in Treatment: 7 History of Present Illness HPI Description: 05/27/17-she is here in initial evaluation for a left-sided sacral stage IV pressure ulcer and bilateral lower extremity, lateral aspect, unstageable pressure ulcers. She is accompanied by her husband and her son, who are her primary caregivers. She is bedbound secondary to spinal stenosis. According to her son and husband she was hospitalized from 10/5-11/1 with healthcare associated pneumonia and altered mental status. During her hospitalization she was intubated, extubated on 10/27. She was discharged with Foley catheter and follow up with urology. According to her son and spouse she developed the sacral ulcer during hospitalization. Home health has been  applying Santyl daily. She does have a low air loss mattress and is repositioned every 2-3 hours per her family's report. According to the son and spouse she had an appointment with urology on 12/18 and during that appointment developed discoloration to her bilateral lower extremities which ultimately developed into unstageable pressure ulcers to the lateral aspects of her bilateral lower extremities. There has been no topical treatment applied to these. She continues to have home health. There is no concerns expressed regarding dietary intake, stating she eats 3 meals a day, eating was provided; she is supplemented with boost with protein. 06/03/17-she is here in follow-up evaluation for sacral and bilateral lower extremity ulcers. Plain film x-ray done today reveals no distraction to the sacrum or coccyx, no visible abnormalities. Home health has ordered the negative pressure wound system but it has not been initiated. We will continue with Hydrofera Blue until initiation of negative pressure wound system and continue with Santyl to bilateral lower extremity ulcers. Follow-up next week 06/10/17-she is here in follow-up evaluation for sacral and bilateral lower extremity ulcers. The wound VAC will be available tomorrow per home health. We will initiate wound VAC therapy to the sacral ulcer 3 times weekly (Thursday, Saturday/Sunday, Tuesday). We will continue with Santyl to the lower extremity ulcers. The patient's son is checking into home health therapy over the weekend for [facility] changes, with the understanding that if VAC changes cannot be performed over the weekend he will need to change his mother's appointments to Monday, Wednesday or Friday. The sacral ulcer clinically does not appear infected but there has been a change in the amount of drainage acutely, there is no significant amount of devitalized tissue, there is no malodor. Wound culture was obtained to evaluate for occult infection we will  hold off on antibiotic therapy until sensitivities are resulted. 06/18/17 on evaluation today patient appears to be doing fairly well in my opinion although this is the first time I  have seen this patient she has been previously evaluated by Tacey RuizLeah here in our office. She is going to be switching to Fridays to see me due to the Wound VAC schedule being changed on Monday, Wednesday, and Friday. Subsequently she seems to be doing fairly well with the Wound VAC. Her son who was present during evaluation today states that he somewhat stresses of the Wound VAC in making sure that it was functioning appropriately. With that being said everything seems to be working well [facility] and ensure that it is functioning properly. Overall the wound appears to be nice and clean there is no need for debridement today. She has no discomfort in her bilateral lower extremity ulcers also appear to be improving based on measurements and what this honest tell me about the overall appearance. 07/02/17 on evaluation today I noted in patients wound bed that was actually an odor that had not previously been noted. Subsequently there was also a small area of bone that was not previously noted during my last evaluation. This did appear to be necrotic and was being somewhat forced out by the body around the region of granulation. She does not have any pain which is good news. Nonetheless the overall appearance of the ulcer is making me concerned for the patient having developed osteomyelitis. Currently she is not been on any antibiotics and the Wound VAC has been doing fairly well in general. With that being said I do not think we need to continue the Wound VAC if she potentially has a bone infection. At least not until it is properly addressed with antibiotics. 07/09/17 on evaluation today patient appears to actually be doing very well in regard to her sacral ulcer compared to last  week. There is actually no exposed bone at this point. 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 With that being said still I do believe she needs to have a referral to infectious disease due to the osteomyelitis I am gonna recommend that she continue with the doxycycline based on the culture results which showed Eikenella Corrodens as the Jessica [name], [name] ([id number]) organism identified that tetracycline should work for. She does not seem to be having any discomfort whatsoever at this point. 07/16/17 on evaluation today patient actually appears to be doing rather well in regard to her sacral wound. I think that the original wound site actually appears much better than previously noted. With that being said she does have a new superficial injury on the right sacral region which appears to be due to according to the sign transport that occurred for her MRI unfortunately. Fortunately this is not too deep and I do think it can be managed but it was a new area that was not previously noted. Otherwise she has been tolerating the Dakinos soaked gauze packing without complication. Electronic Signature(s) Signed: 07/16/2017 5:57:55 PM By: Lenda KelpStone III, Hoyt PA-C Entered By: Lenda KelpStone III, Hoyt on 07/16/2017 17:30:27 Jessica [name], [name] ([id number]) -------------------------------------------------------------------------------- Physical Exam Details [name], [name] Date of Service: 07/16/2017 2:00 PM Medical Record Number: [id number] Patient Account Number: [id number] Date of Birth/Sex: 11/04/1945 (72 y.o. F) Treating RN: Curtis Sitesorthy, Joanna Primary Care Provider: Annita BrodASENSO, PHILIP Other Clinician: Referring Provider: Annita BrodASENSO, PHILIP Treating Provider/Extender: STONE III, HOYT Weeks in Treatment: 7 Constitutional Obese and well-hydrated in  no  acute distress. Respiratory normal breathing without difficulty. clear to auscultation bilaterally. Cardiovascular regular rate and rhythm with normal S1, S2. Psychiatric this patient is able to make decisions and demonstrates good insight into disease process. Alert and Oriented x 3. pleasant and cooperative. Notes Patient's wound shows a fairly good granular bed there is minimal slough noted in the wound bed at this point there is no active and persistent bone exposure. Obviously this is good news as she has significant granulation noted. Electronic Signature(s) Signed: 07/16/2017 5:57:55 PM By: Lenda Kelp PA-C Entered By: Lenda Kelp on 07/16/2017 17:31:10 Jessica [name] ([id number]) -------------------------------------------------------------------------------- Physician Orders Details [name]. Date of Service: 07/16/2017 2:00 PM Medical Record Number: [id number] Patient Account Number: [id number] Date of Birth/Sex: [birth year] (72 y.o. F) Treating RN: Curtis Sites Primary Care Provider: Annita Brod Other Clinician: Referring Provider: Annita Brod Treating Provider/Extender: STONE III, HOYT Weeks in Treatment: 7 Verbal / Phone Orders: No Diagnosis Coding ICD-10 Coding Code Description L89.154 Pressure ulcer of sacral region, stage 4 M48.00 Spinal stenosis, site unspecified R54 Age-related physical debility G89.4 Chronic pain syndrome E78.2 Mixed hyperlipidemia L89.95 Pressure ulcer of unspecified site, unstageable Wound Cleansing Wound #1 Medial Sacrum o Clean wound with Normal Saline. Wound #2 Left Lower Leg o Clean wound with Normal Saline. Wound #3 Right,Lateral Lower Leg o Clean wound with Normal Saline. Anesthetic (add to Medication List) Wound #1 Medial Sacrum o Topical Lidocaine 4% cream applied to wound bed prior to debridement (In Clinic Only). Wound #2 Left Lower Leg o Topical Lidocaine 4% cream applied to  wound bed prior to debridement (In Clinic Only). Wound #3 Right,Lateral Lower Leg o Topical Lidocaine 4% cream applied to wound bed prior to debridement (In Clinic Only). Primary Wound Dressing Wound #1 Medial Sacrum o Other: - Vashe soaked gauze - given to patient Wound #2 Left Lower Leg o Saline moistened gauze o Santyl Ointment - PLACE ON OPEN WOUND AREAS ON LATERAL LOWER LEGS Wound #3 Right,Lateral Lower Leg o Boardered Foam Dressing Secondary Dressing Wound #1 Medial Sacrum LAIANA, FRATUS. ([id number]) o ABD pad o XtraSorb Wound #2 Left Lower Leg o Boardered Foam Dressing Wound #3 Right,Lateral Lower Leg o Boardered Foam Dressing Dressing Change Frequency Wound #1 Medial Sacrum o Change dressing every day. Wound #2 Left Lower Leg o Change dressing every day. Wound #3 Right,Lateral Lower Leg o Change dressing every day. Follow-up Appointments Wound #1 Medial Sacrum o Return Appointment in 1 week. Wound #2 Left Lower Leg o Return Appointment in 1 week. Wound #3 Right,Lateral Lower Leg o Return Appointment in 1 week. Off-Loading Wound #1 Medial Sacrum o Turn and reposition every 2 hours Wound #2 Left Lower Leg o Turn and reposition every 2 hours Wound #3 Right,Lateral Lower Leg o Turn and reposition every 2 hours Additional Orders / Instructions Wound #1 Medial Sacrum o Increase protein intake. Wound #2 Left Lower Leg o Increase protein intake. Wound #3 Right,Lateral Lower Leg o Increase protein intake. Home Health Wound #1 Medial Sacrum o Continue Home Health Visits - [facility] o Home Health Nurse may visit PRN to address patientos wound care needs. o FACE TO FACE ENCOUNTER: MEDICARE and MEDICAID PATIENTS: I certify that this patient is under my care and that I had a face-to-face encounter that meets the physician face-to-face encounter requirements with this Jessica [name], Jessica [name]. ([id number]) patient on this  date. The encounter with the patient was in whole or in part for the following MEDICAL  CONDITION: (primary reason for Home Healthcare) MEDICAL NECESSITY: I certify, that based on my findings, NURSING services are a medically necessary home health service. HOME BOUND STATUS: I certify that my clinical findings support that this patient is homebound (i.e., Due to illness or injury, pt requires aid of supportive devices such as crutches, cane, wheelchairs, walkers, the use of special transportation or the assistance of another person to leave their place of residence. There is a normal inability to leave the home and doing so requires considerable and taxing effort. Other absences are for medical reasons / religious services and are infrequent or of short duration when for other reasons). o If current dressing causes regression in wound condition, may D/C ordered dressing product/s and apply Normal Saline Moist Dressing daily until next Wound Healing Center / Other MD appointment. Notify Wound Healing Center of regression in wound condition at [phone]. o Please direct any NON-WOUND related issues/requests for orders to patient's Primary Care Physician Wound #2 Left Lower Leg o Continue Home Health Visits - [facility] o Home Health Nurse may visit PRN to address patientos wound care needs. o FACE TO FACE ENCOUNTER: MEDICARE and MEDICAID PATIENTS: I certify that this patient is under my care and that I had a face-to-face encounter that meets the physician face-to-face encounter requirements with this patient on this date. The encounter with the patient was in whole or in part for the following MEDICAL CONDITION: (primary reason for Home Healthcare) MEDICAL NECESSITY: I certify, that based on my findings, NURSING services are a medically necessary home health service. HOME BOUND STATUS: I certify that my clinical findings support that this patient is homebound (i.e., Due to illness or  injury, pt requires aid of supportive devices such as crutches, cane, wheelchairs, walkers, the use of special transportation or the assistance of another person to leave their place of residence. There is a normal inability to leave the home and doing so requires considerable and taxing effort. Other absences are for medical reasons / religious services and are infrequent or of short duration when for other reasons). o If current dressing causes regression in wound condition, may D/C ordered dressing product/s and apply Normal Saline Moist Dressing daily until next Wound Healing Center / Other MD appointment. Notify Wound Healing Center of regression in wound condition at [phone]. o Please direct any NON-WOUND related issues/requests for orders to patient's Primary Care Physician Wound #3 Right,Lateral Lower Leg o Continue Home Health Visits - [facility] o Home Health Nurse may visit PRN to address patientos wound care needs. o FACE TO FACE ENCOUNTER: MEDICARE and MEDICAID PATIENTS: I certify that this patient is under my care and that I had a face-to-face encounter that meets the physician face-to-face encounter requirements with this patient on this date. The encounter with the patient was in whole or in part for the following MEDICAL CONDITION: (primary reason for Home Healthcare) MEDICAL NECESSITY: I certify, that based on my findings, NURSING services are a medically necessary home health service. HOME BOUND STATUS: I certify that my clinical findings support that this patient is homebound (i.e., Due to illness or injury, pt requires aid of supportive devices such as crutches, cane, wheelchairs, walkers, the use of special transportation or the assistance of another person to leave their place of residence. There is a normal inability to leave the home and doing so requires considerable and taxing effort. Other absences are for medical reasons / religious services and are  infrequent or of short duration when for other  reasons). o If current dressing causes regression in wound condition, may D/C ordered dressing product/s and apply Normal Saline Moist Dressing daily until next Wound Healing Center / Other MD appointment. Notify Wound Healing Center of regression in wound condition at [phone]. o Please direct any NON-WOUND related issues/requests for orders to patient's Primary Care Physician Negative Pressure Wound Therapy Wound #1 Medial Sacrum o Place NPWT on HOLD. Patient Medications Allergies: zinc, Ambien Notifications Medication Indication Start End Jessica [name], Jessica [name] ([id number]) Notifications Medication Indication Start End doxycycline hyclate 07/16/2017 DOSE 1 - oral 100 mg capsule - 1 capsule oral taken 2 times a day for 21 days Electronic Signature(s) Signed: 07/16/2017 5:33:08 PM By: Lenda Kelp PA-C Previous Signature: 07/16/2017 3:34:53 PM Version By: Curtis Sites Entered By: Lenda Kelp on 07/16/2017 17:33:08 Jessica [name] ([id number]) -------------------------------------------------------------------------------- Problem List Details [name]. Date of Service: 07/16/2017 2:00 PM Medical Record Number: [id number] Patient Account Number: [id number] Date of Birth/Sex: 12/27/1945 (72 y.o. F) Treating RN: Curtis Sites Primary Care Provider: Annita Brod Other Clinician: Referring Provider: Annita Brod Treating Provider/Extender: Linwood Dibbles, HOYT Weeks in Treatment: 7 Active Problems ICD-10 Impacting Encounter Code Description Active Date Wound Healing Diagnosis L89.154 Pressure ulcer of sacral region, stage 4 05/27/2017 Yes M48.00 Spinal stenosis, site unspecified 05/27/2017 Yes R54 Age-related physical debility 05/27/2017 Yes G89.4 Chronic pain syndrome 05/27/2017 Yes E78.2 Mixed hyperlipidemia 05/27/2017 Yes L89.95 Pressure ulcer of unspecified site, unstageable 05/27/2017 Yes Inactive  Problems Resolved Problems Electronic Signature(s) Signed: 07/16/2017 5:57:55 PM By: Lenda Kelp PA-C Entered By: Lenda Kelp on 07/16/2017 14:24:27 Jessica [name] ([id number]) -------------------------------------------------------------------------------- Progress Note Details [name]. Date of Service: 07/16/2017 2:00 PM Medical Record Number: [id number] Patient Account Number: [id number] Date of Birth/Sex: 23-Jul-1945 (72 y.o. F) Treating RN: Curtis Sites Primary Care Provider: Annita Brod Other Clinician: Referring Provider: Annita Brod Treating Provider/Extender: Linwood Dibbles, HOYT Weeks in Treatment: 7 Subjective Chief Complaint Information obtained from Patient she is here for evaluation of a sacral ulcer and bilateral lower extremity ulcers History of Present Illness (HPI) 05/27/17-she is here in initial evaluation for a left-sided sacral stage IV pressure ulcer and bilateral lower extremity, lateral aspect, unstageable pressure ulcers. She is accompanied by her husband and her son, who are her primary caregivers. She is bedbound secondary to spinal stenosis. According to her son and husband she was hospitalized from 10/5-11/1 with healthcare associated pneumonia and altered mental status. During her hospitalization she was intubated, extubated on 10/27. She was discharged with Foley catheter and follow up with urology. According to her son and spouse she developed the sacral ulcer during hospitalization. Home health has been applying Santyl daily. She does have a low air loss mattress and is repositioned every 2-3 hours per her family's report. According to the son and spouse she had an appointment with urology on 12/18 and during that appointment developed discoloration to her bilateral lower extremities which ultimately developed into unstageable pressure ulcers to the lateral aspects of her bilateral lower extremities. There has been no  topical treatment applied to these. She continues to have home health. There is no concerns expressed regarding dietary intake, stating she eats 3 meals a day, eating was provided; she is supplemented with boost with protein. 06/03/17-she is here in follow-up evaluation for sacral and bilateral lower extremity ulcers. Plain film x-ray done today reveals no distraction to the sacrum or coccyx, no visible abnormalities. Home health has ordered the negative pressure wound system  but it has not been initiated. We will continue with Hydrofera Blue until initiation of negative pressure wound system and continue with Santyl to bilateral lower extremity ulcers. Follow-up next week 06/10/17-she is here in follow-up evaluation for sacral and bilateral lower extremity ulcers. The wound VAC will be available tomorrow per home health. We will initiate wound VAC therapy to the sacral ulcer 3 times weekly (Thursday, Saturday/Sunday, Tuesday). We will continue with Santyl to the lower extremity ulcers. The patient's son is checking into home health therapy over the weekend for [facility] changes, with the understanding that if VAC changes cannot be performed over the weekend he will need to change his mother's appointments to Monday, Wednesday or Friday. The sacral ulcer clinically does not appear infected but there has been a change in the amount of drainage acutely, there is no significant amount of devitalized tissue, there is no malodor. Wound culture was obtained to evaluate for occult infection we will hold off on antibiotic therapy until sensitivities are resulted. 06/18/17 on evaluation today patient appears to be doing fairly well in my opinion although this is the first time I have seen this patient she has been previously evaluated by Tacey Ruiz here in our office. She is going to be switching to Fridays to see me due to the Wound VAC schedule being changed on Monday, Wednesday, and Friday. Subsequently she seems to be  doing fairly well with the Wound VAC. Her son who was present during evaluation today states that he somewhat stresses of the Wound VAC in making sure that it was functioning appropriately. With that being said everything seems to be working well he knows what settings on the [facility] itself to look at and ensure that it is functioning properly. Overall the wound appears to be nice and clean there is no need for debridement today. She has no discomfort in her bilateral lower extremity ulcers also appear to be improving based on measurements and what this honest tell me about the overall appearance. 07/02/17 on evaluation today I noted in patients wound bed that was actually an odor that had not previously been noted. Subsequently there was also a small area of bone that was not previously noted during my last evaluation. This did appear to be necrotic and was being somewhat forced out by the body around the region of granulation. She does not have any pain which is good news. Nonetheless the overall appearance of the ulcer is making me concerned for the patient having developed osteomyelitis. Currently she is not been on any antibiotics and the Wound VAC has been doing fairly well in general. With that being said I do not think we need to continue the Wound VAC if she potentially has a bone infection. At least not until it is properly addressed with antibiotics. Jessica [name], Jessica [name] ([id number]) 07/09/17 on evaluation today patient appears to actually be doing very well in regard to her sacral ulcer compared to last week. There is actually no exposed bone at this point. 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 With that being said still I do believe she needs to have a referral to infectious disease due to the osteomyelitis I am gonna recommend that  she continue with the doxycycline based on the culture results which showed Eikenella Corrodens as the organism identified that tetracycline should work for. She does  not seem to be having any discomfort whatsoever at this point. 07/16/17 on evaluation today patient actually appears to be doing rather well in regard to her sacral wound. I think that the original wound site actually appears much better than previously noted. With that being said she does have a new superficial injury on the right sacral region which appears to be due to according to the sign transport that occurred for her MRI unfortunately. Fortunately this is not too deep and I do think it can be managed but it was a new area that was not previously noted. Otherwise she has been tolerating the Dakin s soaked gauze packing without complication. Patient History Information obtained from Patient. Family History Hypertension - Father, Lung Disease - Father, No family history of Cancer, Diabetes, Heart Disease, Kidney Disease, Seizures, Stroke, Thyroid Problems, Tuberculosis. Social History Never smoker, Marital Status - Married, Alcohol Use - Never, Drug Use - No History, Caffeine Use - Daily. Medical History Hospitalization/Surgery History - 01/15/2017, [facility], UTI and yeats infection with pneumonia. Review of Systems (ROS) Constitutional Symptoms (General Health) Denies complaints or symptoms of Fever, Chills. Respiratory The patient has no complaints or symptoms. Cardiovascular The patient has no complaints or symptoms. Psychiatric The patient has no complaints or symptoms. Objective Constitutional Obese and well-hydrated in no acute distress. Vitals Time Taken: 2:01 PM, Height: 70 in, Source: Stated, Temperature: 98.1 F, Pulse: 78 bpm, Respiratory Rate: 16 breaths/min, Blood Pressure: 112/59 mmHg. Respiratory normal breathing without difficulty. clear to auscultation bilaterally. Jessica [name], Jessica [name]  ([id number]) Cardiovascular regular rate and rhythm with normal S1, S2. Psychiatric this patient is able to make decisions and demonstrates good insight into disease process. Alert and Oriented x 3. pleasant and cooperative. General Notes: Patient's wound shows a fairly good granular bed there is minimal slough noted in the wound bed at this point there is no active and persistent bone exposure. Obviously this is good news as she has significant granulation noted. Integumentary (Hair, Skin) Wound #1 status is Open. Original cause of wound was Pressure Injury. The wound is located on the Medial Sacrum. The wound measures 8.5cm length x 6.2cm width x 2.4cm depth; 41.39cm^2 area and 99.337cm^3 volume. There is bone, muscle, and Fat Layer (Subcutaneous Tissue) Exposed exposed. There is no tunneling noted, however, there is undermining starting at 8:00 and ending at 2:00 with a maximum distance of 2cm. There is a large amount of serous drainage noted. Foul odor after cleansing was noted. The wound margin is distinct with the outline attached to the wound base. There is large (67- 100%) red, hyper - granulation within the wound bed. There is a small (1-33%) amount of necrotic tissue within the wound bed including Eschar and Adherent Slough. The periwound skin appearance exhibited: Induration, Scarring, Erythema. The periwound skin appearance did not exhibit: Callus, Crepitus, Excoriation, Rash, Dry/Scaly, Maceration, Atrophie Blanche, Cyanosis, Ecchymosis, Hemosiderin Staining, Mottled, Pallor, Rubor. The surrounding wound skin color is noted with erythema which is circumferential. Periwound temperature was noted as No Abnormality. The periwound has tenderness on palpation. Wound #2 status is Open. Original cause of wound was Gradually Appeared. The wound is located on the Left Lower Leg. The wound measures 2.5cm length x 2.5cm width x 0.1cm depth; 4.909cm^2 area and 0.491cm^3 volume. There is Fat  Layer (Subcutaneous Tissue) Exposed exposed. There is no tunneling or undermining noted. There is a small amount of serous drainage noted. The wound margin is flat and intact. There is no granulation within the wound bed.  There is a large (67-100%) amount of necrotic tissue within the wound bed including Eschar. The periwound skin appearance exhibited: Erythema. The surrounding wound skin color is noted with erythema which is circumferential. Wound #3 status is Open. Original cause of wound was Gradually Appeared. The wound is located on the Right,Lateral Lower Leg. The wound measures 0.6cm length x 0.3cm width x 0.1cm depth; 0.141cm^2 area and 0.014cm^3 volume. There is no tunneling or undermining noted. There is a none present amount of drainage noted. The wound margin is flat and intact. There is no granulation within the wound bed. There is a large (67-100%) amount of necrotic tissue within the wound bed including Eschar. The periwound skin appearance did not exhibit: Callus, Crepitus, Excoriation, Induration, Rash, Scarring, Dry/Scaly, Maceration, Atrophie Blanche, Cyanosis, Ecchymosis, Hemosiderin Staining, Mottled, Pallor, Rubor, Erythema. Periwound temperature was noted as No Abnormality. The periwound has tenderness on palpation. 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 ([id number]) Plan Wound Cleansing: Wound #1 Medial Sacrum: Clean wound with Normal Saline. Wound #2 Left Lower Leg: Clean wound with Normal Saline. Wound #3 Right,Lateral Lower Leg: Clean wound with Normal Saline. Anesthetic (add to Medication List): Wound #1 Medial Sacrum: Topical Lidocaine 4% cream applied to wound bed prior to debridement (In Clinic  Only). Wound #2 Left Lower Leg: Topical Lidocaine 4% cream applied to wound bed prior to debridement (In Clinic Only). Wound #3 Right,Lateral Lower Leg: Topical Lidocaine 4% cream applied to wound bed prior to debridement (In Clinic Only). Primary Wound Dressing: Wound #1 Medial Sacrum: Other: - Vashe soaked gauze - given to patient Wound #2 Left Lower Leg: Saline moistened gauze Santyl Ointment - PLACE ON OPEN WOUND AREAS ON LATERAL LOWER LEGS Wound #3 Right,Lateral Lower Leg: Boardered Foam Dressing Secondary Dressing: Wound #1 Medial Sacrum: ABD pad XtraSorb Wound #2 Left Lower Leg: Boardered Foam Dressing Wound #3 Right,Lateral Lower Leg: Boardered Foam Dressing Dressing Change Frequency: Wound #1 Medial Sacrum: Change dressing every day. Wound #2 Left Lower Leg: Change dressing every day. Wound #3 Right,Lateral Lower Leg: Change dressing every day. Follow-up Appointments: Wound #1 Medial Sacrum: Return Appointment in 1 week. Wound #2 Left Lower Leg: Return Appointment in 1 week. Wound #3 Right,Lateral Lower Leg: Return Appointment in 1 week. Off-Loading: Wound #1 Medial Sacrum: Turn and reposition every 2 hours Wound #2 Left Lower Leg: Turn and reposition every 2 hours Wound #3 Right,Lateral Lower Leg: Turn and reposition every 2 hours Additional Orders / Instructions: Wound #1 Medial Sacrum: Increase protein intake. Wound #2 Left Lower Leg: Increase protein intake. Wound #3 Right,Lateral Lower Leg: Jessica [name], Jessica [name]. ([id number]) Increase protein intake. Home Health: Wound #1 Medial Sacrum: Continue Home Health Visits - [facility] Nurse may visit PRN to address patient s wound care needs. FACE TO FACE ENCOUNTER: MEDICARE and MEDICAID PATIENTS: I certify that this patient is under my care and that I had a face-to-face encounter that meets the physician face-to-face encounter requirements with this patient on this date. The encounter with the  patient was in whole or in part for the following MEDICAL CONDITION: (primary reason for Home Healthcare) MEDICAL NECESSITY: I certify, that based on my findings, NURSING services are a medically necessary home health service. HOME BOUND STATUS: I certify that my clinical findings support that this  patient is homebound (i.e., Due to illness or injury, pt requires aid of supportive devices such as crutches, cane, wheelchairs, walkers, the use of special transportation or the assistance of another person to leave their place of residence. There is a normal inability to leave the home and doing so requires considerable and taxing effort. Other absences are for medical reasons / religious services and are infrequent or of short duration when for other reasons). If current dressing causes regression in wound condition, may D/C ordered dressing product/s and apply Normal Saline Moist Dressing daily until next Wound Healing Center / Other MD appointment. Notify Wound Healing Center of regression in wound condition at [phone]. Please direct any NON-WOUND related issues/requests for orders to patient's Primary Care Physician Wound #2 Left Lower Leg: Continue Home Health Visits - [facility] Nurse may visit PRN to address patient s wound care needs. FACE TO FACE ENCOUNTER: MEDICARE and MEDICAID PATIENTS: I certify that this patient is under my care and that I had a face-to-face encounter that meets the physician face-to-face encounter requirements with this patient on this date. The encounter with the patient was in whole or in part for the following MEDICAL CONDITION: (primary reason for Home Healthcare) MEDICAL NECESSITY: I certify, that based on my findings, NURSING services are a medically necessary home health service. HOME BOUND STATUS: I certify that my clinical findings support that this patient is homebound (i.e., Due to illness or injury, pt requires aid of supportive devices such as  crutches, cane, wheelchairs, walkers, the use of special transportation or the assistance of another person to leave their place of residence. There is a normal inability to leave the home and doing so requires considerable and taxing effort. Other absences are for medical reasons / religious services and are infrequent or of short duration when for other reasons). If current dressing causes regression in wound condition, may D/C ordered dressing product/s and apply Normal Saline Moist Dressing daily until next Wound Healing Center / Other MD appointment. Notify Wound Healing Center of regression in wound condition at [phone]. Please direct any NON-WOUND related issues/requests for orders to patient's Primary Care Physician Wound #3 Right,Lateral Lower Leg: Continue Home Health Visits - [facility] Nurse may visit PRN to address patient s wound care needs. FACE TO FACE ENCOUNTER: MEDICARE and MEDICAID PATIENTS: I certify that this patient is under my care and that I had a face-to-face encounter that meets the physician face-to-face encounter requirements with this patient on this date. The encounter with the patient was in whole or in part for the following MEDICAL CONDITION: (primary reason for Home Healthcare) MEDICAL NECESSITY: I certify, that based on my findings, NURSING services are a medically necessary home health service. HOME BOUND STATUS: I certify that my clinical findings support that this patient is homebound (i.e., Due to illness or injury, pt requires aid of supportive devices such as crutches, cane, wheelchairs, walkers, the use of special transportation or the assistance of another person to leave their place of residence. There is a normal inability to leave the home and doing so requires considerable and taxing effort. Other absences are for medical reasons / religious services and are infrequent or of short duration when for other reasons). If current dressing  causes regression in wound condition, may D/C ordered dressing product/s and apply Normal Saline Moist Dressing daily until next Wound Healing Center / Other MD appointment. Notify Wound Healing Center of regression in wound condition at [phone]. Please direct any  NON-WOUND related issues/requests for orders to patient's Primary Care Physician Negative Pressure Wound Therapy: Wound #1 Medial Sacrum: Place NPWT on HOLD. The following medication(s) was prescribed: doxycycline hyclate oral 100 mg capsule 1 1 capsule oral taken 2 times a day for 21 days starting 07/16/2017 Jessica [name] ([id number]) Currently I'm going to suggest that we continue with the Current wound care measures in regard to the patient's sacral. Patient son is in agreement with plan. I am gonna send in a refill for the antibiotic today to get her through until she sees Dr. Sampson Goon. I'm still keeping the Wound VAC on hold until she seeds [place] and then we may reinitiate the Wound VAC at that point. A lot will depend on how things progress between now and then. Nonetheless I am encouraged by what I'm seeing as far as her wound is concerned today. All of this was discussed with patient son who was present during evaluation along with the patient herself. Please see above for specific wound care orders. We will see patient for re-evaluation in 2 week(s) here in the clinic. If anything worsens or changes patient will contact our office for additional recommendations. Electronic Signature(s) Signed: 07/16/2017 5:57:55 PM By: Lenda Kelp PA-C Entered By: Lenda Kelp on 07/16/2017 17:33:39 Jessica [name] ([id number]) -------------------------------------------------------------------------------- ROS/PFSH Details [name]. Date of Service: 07/16/2017 2:00 PM Medical Record Number: [id number] Patient Account Number: [id number] Date of Birth/Sex: 07-09-1945 (72 y.o. F) Treating RN:  Curtis Sites Primary Care Provider: Annita Brod Other Clinician: Referring Provider: Annita Brod Treating Provider/Extender: STONE III, HOYT Weeks in Treatment: 7 Information Obtained From Patient Wound History Do you currently have one or more open woundso Yes How many open wounds do you currently haveo 3 How have you been treating your wound(s) until nowo 3 months Has your wound(s) ever healed and then re-openedo No Have you had any lab work done in the past montho No Have you tested positive for an antibiotic resistant organism (MRSA, VRE)o No Constitutional Symptoms (General Health) Complaints and Symptoms: Negative for: Fever; Chills Eyes Medical History: Negative for: Cataracts; Glaucoma; Optic Neuritis Hematologic/Lymphatic Medical History: Positive for: Anemia; Lymphedema Negative for: Hemophilia; Human Immunodeficiency Virus; Sickle Cell Disease Respiratory Complaints and Symptoms: No Complaints or Symptoms Medical History: Negative for: Aspiration; Asthma; Chronic Obstructive Pulmonary Disease (COPD); Pneumothorax; Sleep Apnea; Tuberculosis Cardiovascular Complaints and Symptoms: No Complaints or Symptoms Medical History: Positive for: Deep Vein Thrombosis Negative for: Angina; Arrhythmia; Congestive Heart Failure; Coronary Artery Disease; Hypertension; Hypotension; Myocardial Infarction Gastrointestinal Medical History: Negative for: Cirrhosis ; Colitis; Crohnos; Hepatitis A; Hepatitis B; Hepatitis C TANZIE, ROTHSCHILD. ([id number]) Endocrine Medical History: Negative for: Type I Diabetes; Type II Diabetes Genitourinary Medical History: Negative for: End Stage Renal Disease Immunological Medical History: Negative for: Lupus Erythematosus; Raynaudos; Scleroderma Integumentary (Skin) Medical History: Negative for: History of Burn; History of pressure wounds Musculoskeletal Medical History: Negative for: Gout; Rheumatoid Arthritis; Osteoarthritis;  Osteomyelitis Neurologic Medical History: Positive for: Quadriplegia Negative for: Dementia; Neuropathy; Paraplegia; Seizure Disorder Psychiatric Complaints and Symptoms: No Complaints or Symptoms Medical History: Negative for: Anorexia/bulimia; Confinement Anxiety Immunizations Pneumococcal Vaccine: Received Pneumococcal Vaccination: Yes Implantable Devices Hospitalization / Surgery History Name of Hospital Purpose of Hospitalization/Surgery Date [facility] UTI and yeats infection with pneumonia 01/15/2017 Family and Social History Cancer: No; Diabetes: No; Heart Disease: No; Hypertension: Yes - Father; Kidney Disease: No; Lung Disease: Yes - Father; Seizures: No; Stroke: No; Thyroid Problems: No; Tuberculosis: No; Never smoker; Marital Status -  Married; Alcohol Use: Never; Drug Use: No History; Caffeine Use: Daily; Financial Concerns: No; Food, Clothing or Shelter Needs: No; Support System Lacking: No; Advanced Directives: No; Patient does not want information on Advanced Directives; Do not resuscitate: No; Living Will: No; Medical Power of Attorney: No Physician Affirmation I have reviewed and agree with the above information. KORIANNA, WASHER ([id number]) Electronic Signature(s) Signed: 07/16/2017 5:57:55 PM By: Lenda Kelp PA-C Signed: 07/19/2017 4:12:39 PM By: Curtis Sites Entered By: Lenda Kelp on 07/16/2017 17:30:45 Jessica [name] ([id number]) -------------------------------------------------------------------------------- SuperBill Details [name]. Date of Service: 07/16/2017 Medical Record Number: [id number] Patient Account Number: [id number] Date of Birth/Sex: Mar 30, 1946 (72 y.o. F) Treating RN: Curtis Sites Primary Care Provider: Annita Brod Other Clinician: Referring Provider: Annita Brod Treating Provider/Extender: STONE III, HOYT Weeks in Treatment: 7 Diagnosis Coding ICD-10 Codes Code Description L89.154 Pressure ulcer of  sacral region, stage 4 M48.00 Spinal stenosis, site unspecified R54 Age-related physical debility G89.4 Chronic pain syndrome E78.2 Mixed hyperlipidemia L89.95 Pressure ulcer of unspecified site, unstageable Facility Procedures CPT4 Code: [id number] Description: 99214 - WOUND CARE VISIT-LEV 4 EST PT Modifier: Quantity: 1 Physician Procedures CPT4 Code: [id number] Description: 99213 - WC PHYS LEVEL 3 - EST PT ICD-10 Diagnosis Description L89.154 Pressure ulcer of sacral region, stage 4 M48.00 Spinal stenosis, site unspecified R54 Age-related physical debility G89.4 Chronic pain syndrome Modifier: Quantity: 1 Electronic Signature(s) Signed: 07/16/2017 5:57:55 PM By: Lenda Kelp PA-C Entered By: Lenda Kelp on 07/16/2017 17:33:56

## 2017-07-22 ENCOUNTER — Other Ambulatory Visit: Payer: Medicare Other

## 2017-07-30 ENCOUNTER — Encounter: Payer: Medicare Other | Admitting: Physician Assistant

## 2017-07-30 DIAGNOSIS — E11622 Type 2 diabetes mellitus with other skin ulcer: Secondary | ICD-10-CM | POA: Diagnosis not present

## 2017-08-06 NOTE — Progress Notes (Signed)
BETTEY, MURAOKA ([id number]) Visit Report for 07/30/2017 Chief Complaint Document Details [name], Jessica [name]. Date of Service: 07/30/2017 11:15 AM Medical Record Number: [id number] Patient Account Number: [id number] Date of Birth/Sex: [birth year] (72 y.o. F) Treating RN: Curtis Sites Primary Care Provider: Annita Brod Other Clinician: Referring Provider: Annita Brod Treating Provider/Extender: Linwood Dibbles, Janan Bogie Weeks in Treatment: 9 Information Obtained from: Patient Chief Complaint she is here for evaluation of a sacral ulcer and bilateral lower extremity ulcers Electronic Signature(s) Signed: 07/30/2017 5:53:05 PM By: Lenda Kelp PA-C Entered By: Lenda Kelp on 07/30/2017 11:27:53 Jessica [name] ([id number]) -------------------------------------------------------------------------------- Debridement Details [name]. Date of Service: 07/30/2017 11:15 AM Medical Record Number: [id number] Patient Account Number: [id number] Date of Birth/Sex: 01/13/1946 (72 y.o. F) Treating RN: Curtis Sites Primary Care Provider: Annita Brod Other Clinician: Referring Provider: Annita Brod Treating Provider/Extender: Linwood Dibbles, Kennisha Qin Weeks in Treatment: 9 Debridement Performed for Wound #2 Left Lower Leg Assessment: Performed By: Physician STONE III, Damein Gaunce E., PA-C Debridement Type: Debridement Pre-procedure Verification/Time Yes - 11:31 Out Taken: Start Time: 11:31 Pain Control: Lidocaine 4% Topical Solution Total Area Debrided (L x W): 2 (cm) x 2 (cm) = 4 (cm) Tissue and other material Viable, Non-Viable, Eschar, Slough, Subcutaneous, Slough debrided: Level: Skin/Subcutaneous Tissue Debridement Description: Excisional Instrument: Curette Bleeding: Minimum Hemostasis Achieved: Pressure End Time: 11:35 Procedural Pain: 0 Post Procedural Pain: 0 Response to Treatment: Procedure was tolerated well Post Debridement Measurements  of Total Wound Length: (cm) 6 Stage: Category/Stage III Width: (cm) 2.3 Depth: (cm) 0.4 Volume: (cm) 4.335 Character of Wound/Ulcer Post Improved Debridement: Post Procedure Diagnosis Same as Pre-procedure Electronic Signature(s) Signed: 07/30/2017 3:07:03 PM By: Curtis Sites Signed: 07/30/2017 5:53:05 PM By: Lenda Kelp PA-C Entered By: Curtis Sites on 07/30/2017 11:34:29 Jessica [name] ([id number]) -------------------------------------------------------------------------------- HPI Details [name]. Date of Service: 07/30/2017 11:15 AM Medical Record Number: [id number] Patient Account Number: [id number] Date of Birth/Sex: 1945-09-27 (72 y.o. F) Treating RN: Curtis Sites Primary Care Provider: Annita Brod Other Clinician: Referring Provider: Annita Brod Treating Provider/Extender: Linwood Dibbles, Syvanna Ciolino Weeks in Treatment: 9 History of Present Illness HPI Description: 05/27/17-she is here in initial evaluation for a left-sided sacral stage IV pressure ulcer and bilateral lower extremity, lateral aspect, unstageable pressure ulcers. She is accompanied by her husband and her son, who are her primary caregivers. She is bedbound secondary to spinal stenosis. According to her son and husband she was hospitalized from 10/5-11/1 with healthcare associated pneumonia and altered mental status. During her hospitalization she was intubated, extubated on 10/27. She was discharged with Foley catheter and follow up with urology. According to her son and spouse she developed the sacral ulcer during hospitalization. Home health has been applying Santyl daily. She does have a low air loss mattress and is repositioned every 2-3 hours per her family's report. According to the son and spouse she had an appointment with urology on 12/18 and during that appointment developed discoloration to her bilateral lower extremities which ultimately developed into unstageable  pressure ulcers to the lateral aspects of her bilateral lower extremities. There has been no topical treatment applied to these. She continues to have home health. There is no concerns expressed regarding dietary intake, stating she eats 3 meals a day, eating was provided; she is supplemented with boost with protein. 06/03/17-she is here in follow-up evaluation for sacral and bilateral lower extremity ulcers. Plain film x-ray done today reveals no distraction to the sacrum or coccyx,  no visible abnormalities. Home health has ordered the negative pressure wound system but it has not been initiated. We will continue with Hydrofera Blue until initiation of negative pressure wound system and continue with Santyl to bilateral lower extremity ulcers. Follow-up next week 06/10/17-she is here in follow-up evaluation for sacral and bilateral lower extremity ulcers. The wound VAC will be available tomorrow per home health. We will initiate wound VAC therapy to the sacral ulcer 3 times weekly (Thursday, Saturday/Sunday, Tuesday). We will continue with Santyl to the lower extremity ulcers. The patient's son is checking into home health therapy over the weekend for [facility] changes, with the understanding that if VAC changes cannot be performed over the weekend he will need to change his mother's appointments to Monday, Wednesday or Friday. The sacral ulcer clinically does not appear infected but there has been a change in the amount of drainage acutely, there is no significant amount of devitalized tissue, there is no malodor. Wound culture was obtained to evaluate for occult infection we will hold off on antibiotic therapy until sensitivities are resulted. 06/18/17 on evaluation today patient appears to be doing fairly well in my opinion although this is the first time I have seen this patient she has been previously evaluated by Tacey Ruiz here in our office. She is going to be switching to Fridays to see me due to the  Wound VAC schedule being changed on Monday, Wednesday, and Friday. Subsequently she seems to be doing fairly well with the Wound VAC. Her son who was present during evaluation today states that he somewhat stresses of the Wound VAC in making sure that it was functioning appropriately. With that being said everything seems to be working well he knows what settings on the [facility] itself to look at and ensure that it is functioning properly. Overall the wound appears to be nice and clean there is no need for debridement today. She has no discomfort in her bilateral lower extremity ulcers also appear to be improving based on measurements and what this honest tell me about the overall appearance. 07/02/17 on evaluation today I noted in patients wound bed that was actually an odor that had not previously been noted. Subsequently there was also a small area of bone that was not previously noted during my last evaluation. This did appear to be necrotic and was being somewhat forced out by the body around the region of granulation. She does not have any pain which is good news. Nonetheless the overall appearance of the ulcer is making me concerned for the patient having developed osteomyelitis. Currently she is not been on any antibiotics and the Wound VAC has been doing fairly well in general. With that being said I do not think we need to continue the Wound VAC if she potentially has a bone infection. At least not until it is properly addressed with antibiotics. 07/09/17 on evaluation today patient appears to actually be doing very well in regard to her sacral ulcer compared to last week. There is actually no exposed bone at this point. 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 With that being said still I do believe she needs to have a referral to  infectious disease due to the osteomyelitis I am gonna recommend that she continue with the doxycycline based on the culture results which showed Eikenella Corrodens as the [facility],  DERHONDA EASTLICK ([id number]) organism identified that tetracycline should work for. She does not seem to be having any discomfort whatsoever at this point. 07/16/17 on evaluation today patient actually appears to be doing rather well in regard to her sacral wound. I think that the original wound site actually appears much better than previously noted. With that being said she does have a new superficial injury on the right sacral region which appears to be due to according to the sign transport that occurred for her MRI unfortunately. Fortunately this is not too deep and I do think it can be managed but it was a new area that was not previously noted. Otherwise she has been tolerating the Dakinos soaked gauze packing without complication. 07/30/17 on evaluation today patient presents for reevaluation concerning her sacral wound. She has been tolerating the dressing changes without complication. With that being said her wound is doing so well at this point that I think she may be at the point where we could reinitiate the Wound VAC currently and hopefully see good results from this. I'm very pleased with how she has responded to the Dakinos soaked gauze packing. Electronic Signature(s) Signed: 07/30/2017 5:53:05 PM By: Lenda Kelp PA-C Entered By: Lenda Kelp on 07/30/2017 17:32:43 Jessica [name] ([id number]) -------------------------------------------------------------------------------- Physical Exam Details [name]. Date of Service: 07/30/2017 11:15 AM Medical Record Number: [id number] Patient Account Number: [id number] Date of Birth/Sex: January 04, 1946 (72 y.o. F) Treating RN: Curtis Sites Primary Care Provider: Annita Brod Other Clinician: Referring Provider: Annita Brod Treating  Provider/Extender: STONE III, Wally Behan Weeks in Treatment: 9 Constitutional Well-nourished and well-hydrated in no acute distress. Respiratory normal breathing without difficulty. clear to auscultation bilaterally. Cardiovascular regular rate and rhythm with normal S1, S2. Psychiatric this patient is able to make decisions and demonstrates good insight into disease process. Alert and Oriented x 3. pleasant and cooperative. Notes Currently patient has been in the past debrided although I do not see anything that required sharp debridement today. I think that the Dakin's has been doing very well to keep this clean and I think we're at the point of reinitiating the VAC. Electronic Signature(s) Signed: 07/30/2017 5:53:05 PM By: Lenda Kelp PA-C Entered By: Lenda Kelp on 07/30/2017 17:33:34 Jessica [name] ([id number]) -------------------------------------------------------------------------------- Physician Orders Details [name]. Date of Service: 07/30/2017 11:15 AM Medical Record Number: [id number] Patient Account Number: [id number] Date of Birth/Sex: 10-Jun-1945 (72 y.o. F) Treating RN: Curtis Sites Primary Care Provider: Annita Brod Other Clinician: Referring Provider: Annita Brod Treating Provider/Extender: STONE III, Tyhir Schwan Weeks in Treatment: 9 Verbal / Phone Orders: No Diagnosis Coding ICD-10 Coding Code Description L89.154 Pressure ulcer of sacral region, stage 4 M48.00 Spinal stenosis, site unspecified R54 Age-related physical debility G89.4 Chronic pain syndrome E78.2 Mixed hyperlipidemia L89.95 Pressure ulcer of unspecified site, unstageable Wound Cleansing Wound #1 Medial Sacrum o Clean wound with Normal Saline. Wound #2 Left Lower Leg o Clean wound with Normal Saline. Wound #3 Right,Lateral Lower Leg o Clean wound with Normal Saline. Anesthetic (add to Medication List) Wound #1 Medial Sacrum o Topical Lidocaine 4%  cream applied to wound bed prior to debridement (In Clinic Only). Wound #2 Left Lower Leg o Topical Lidocaine 4% cream applied to wound bed prior to debridement (In Clinic Only). Wound #3 Right,Lateral Lower Leg o Topical Lidocaine 4% cream applied to wound bed prior to debridement (In Clinic Only). Primary Wound Dressing Wound #1 Medial Sacrum o Other: - Vashe soaked gauze -  given to patient - continue this until NPWT is restarted by [facility] Wound #2 Left Lower Leg o Santyl Ointment - PLACE ON OPEN WOUND AREAS ON LATERAL LOWER LEGS o Silver Alginate - over santyl Wound #3 Right,Lateral Lower Leg o Boardered Foam Dressing - and skin prep Secondary Dressing Wound #1 Medial Sacrum ERVIN, ROTHBAUER. ([id number]) o ABD pad o XtraSorb Wound #2 Left Lower Leg o Boardered Foam Dressing - or ABD pad and tape Wound #3 Right,Lateral Lower Leg o Boardered Foam Dressing Dressing Change Frequency Wound #1 Medial Sacrum o Change dressing every day. Wound #2 Left Lower Leg o Change dressing every day. Wound #3 Right,Lateral Lower Leg o Change dressing every day. Follow-up Appointments Wound #1 Medial Sacrum o Return Appointment in 2 weeks. Wound #2 Left Lower Leg o Return Appointment in 2 weeks. Wound #3 Right,Lateral Lower Leg o Return Appointment in 2 weeks. Off-Loading Wound #1 Medial Sacrum o Turn and reposition every 2 hours Wound #2 Left Lower Leg o Turn and reposition every 2 hours Wound #3 Right,Lateral Lower Leg o Turn and reposition every 2 hours Additional Orders / Instructions Wound #1 Medial Sacrum o Increase protein intake. Wound #2 Left Lower Leg o Increase protein intake. Wound #3 Right,Lateral Lower Leg o Increase protein intake. Home Health Wound #1 Medial Sacrum o Continue Home Health Visits - Advanced o Home Health Nurse may visit PRN to address patientos wound care needs. o FACE TO FACE ENCOUNTER: MEDICARE and  MEDICAID PATIENTS: I certify that this patient is under my care and that I had a face-to-face encounter that meets the physician face-to-face encounter requirements with this JELANI, VREELAND. ([id number]) patient on this date. The encounter with the patient was in whole or in part for the following MEDICAL CONDITION: (primary reason for Home Healthcare) MEDICAL NECESSITY: I certify, that based on my findings, NURSING services are a medically necessary home health service. HOME BOUND STATUS: I certify that my clinical findings support that this patient is homebound (i.e., Due to illness or injury, pt requires aid of supportive devices such as crutches, cane, wheelchairs, walkers, the use of special transportation or the assistance of another person to leave their place of residence. There is a normal inability to leave the home and doing so requires considerable and taxing effort. Other absences are for medical reasons / religious services and are infrequent or of short duration when for other reasons). o If current dressing causes regression in wound condition, may D/C ordered dressing product/s and apply Normal Saline Moist Dressing daily until next Wound Healing Center / Other MD appointment. Notify Wound Healing Center of regression in wound condition at [phone]. o Please direct any NON-WOUND related issues/requests for orders to patient's Primary Care Physician Wound #2 Left Lower Leg o Continue Home Health Visits - Advanced o Home Health Nurse may visit PRN to address patientos wound care needs. o FACE TO FACE ENCOUNTER: MEDICARE and MEDICAID PATIENTS: I certify that this patient is under my care and that I had a face-to-face encounter that meets the physician face-to-face encounter requirements with this patient on this date. The encounter with the patient was in whole or in part for the following MEDICAL CONDITION: (primary reason for Home Healthcare) MEDICAL NECESSITY: I  certify, that based on my findings, NURSING services are a medically necessary home health service. HOME BOUND STATUS: I certify that my clinical findings support that this patient is homebound (i.e., Due to illness or injury, pt requires aid of supportive devices  such as crutches, cane, wheelchairs, walkers, the use of special transportation or the assistance of another person to leave their place of residence. There is a normal inability to leave the home and doing so requires considerable and taxing effort. Other absences are for medical reasons / religious services and are infrequent or of short duration when for other reasons). o If current dressing causes regression in wound condition, may D/C ordered dressing product/s and apply Normal Saline Moist Dressing daily until next Wound Healing Center / Other MD appointment. Notify Wound Healing Center of regression in wound condition at [phone]. o Please direct any NON-WOUND related issues/requests for orders to patient's Primary Care Physician Wound #3 Right,Lateral Lower Leg o Continue Home Health Visits - Advanced o Home Health Nurse may visit PRN to address patientos wound care needs. o FACE TO FACE ENCOUNTER: MEDICARE and MEDICAID PATIENTS: I certify that this patient is under my care and that I had a face-to-face encounter that meets the physician face-to-face encounter requirements with this patient on this date. The encounter with the patient was in whole or in part for the following MEDICAL CONDITION: (primary reason for Home Healthcare) MEDICAL NECESSITY: I certify, that based on my findings, NURSING services are a medically necessary home health service. HOME BOUND STATUS: I certify that my clinical findings support that this patient is homebound (i.e., Due to illness or injury, pt requires aid of supportive devices such as crutches, cane, wheelchairs, walkers, the use of special transportation or the assistance of  another person to leave their place of residence. There is a normal inability to leave the home and doing so requires considerable and taxing effort. Other absences are for medical reasons / religious services and are infrequent or of short duration when for other reasons). o If current dressing causes regression in wound condition, may D/C ordered dressing product/s and apply Normal Saline Moist Dressing daily until next Wound Healing Center / Other MD appointment. Notify Wound Healing Center of regression in wound condition at [phone]. o Please direct any NON-WOUND related issues/requests for orders to patient's Primary Care Physician Negative Pressure Wound Therapy Wound #1 Medial Sacrum o Wound VAC settings at 125/130 mmHg continuous pressure. Use BLACK/GREEN foam to wound cavity. Use WHITE foam to fill any tunnel/s and/or undermining. Change VAC dressing 3 X WEEK. Change canister as indicated when full. Nurse may titrate settings and frequency of dressing changes as clinically indicated. o Apply contact layer over base of wound. - on lateral wound ridge DANIELLY, ACKERLEY ([id number]) Electronic Signature(s) Signed: 07/30/2017 3:07:03 PM By: Curtis Sites Signed: 07/30/2017 5:53:05 PM By: Lenda Kelp PA-C Entered By: Curtis Sites on 07/30/2017 11:44:23 Jessica [name] ([id number]) -------------------------------------------------------------------------------- Problem List Details [name]. Date of Service: 07/30/2017 11:15 AM Medical Record Number: [id number] Patient Account Number: [id number] Date of Birth/Sex: 1945/04/29 (72 y.o. F) Treating RN: Curtis Sites Primary Care Provider: Annita Brod Other Clinician: Referring Provider: Annita Brod Treating Provider/Extender: STONE III, Renu Asby Weeks in Treatment: 9 Active Problems ICD-10 Impacting Encounter Code Description Active Date Wound Healing Diagnosis L89.154 Pressure ulcer of  sacral region, stage 4 05/27/2017 Yes M48.00 Spinal stenosis, site unspecified 05/27/2017 Yes R54 Age-related physical debility 05/27/2017 Yes G89.4 Chronic pain syndrome 05/27/2017 Yes E78.2 Mixed hyperlipidemia 05/27/2017 Yes L89.95 Pressure ulcer of unspecified site, unstageable 05/27/2017 Yes Inactive Problems Resolved Problems Electronic Signature(s) Signed: 07/30/2017 5:53:05 PM By: Lenda Kelp PA-C Entered By: Lenda Kelp on 07/30/2017 11:27:48 Jessica [name] ([id number]) --------------------------------------------------------------------------------  Progress Note Details Patient Name: SUMI, LYE. Date of Service: 07/30/2017 11:15 AM Medical Record Number: [id number] Patient Account Number: [id number] Date of Birth/Sex: 1945-06-25 (72 y.o. F) Treating RN: Curtis Sites Primary Care Provider: Annita Brod Other Clinician: Referring Provider: Annita Brod Treating Provider/Extender: Linwood Dibbles, Sayed Apostol Weeks in Treatment: 9 Subjective Chief Complaint Information obtained from Patient she is here for evaluation of a sacral ulcer and bilateral lower extremity ulcers History of Present Illness (HPI) 05/27/17-she is here in initial evaluation for a left-sided sacral stage IV pressure ulcer and bilateral lower extremity, lateral aspect, unstageable pressure ulcers. She is accompanied by her husband and her son, who are her primary caregivers. She is bedbound secondary to spinal stenosis. According to her son and husband she was hospitalized from 10/5-11/1 with healthcare associated pneumonia and altered mental status. During her hospitalization she was intubated, extubated on 10/27. She was discharged with Foley catheter and follow up with urology. According to her son and spouse she developed the sacral ulcer during hospitalization. Home health has been applying Santyl daily. She does have a low air loss mattress and is repositioned every 2-3 hours per her family's  report. According to the son and spouse she had an appointment with urology on 12/18 and during that appointment developed discoloration to her bilateral lower extremities which ultimately developed into unstageable pressure ulcers to the lateral aspects of her bilateral lower extremities. There has been no topical treatment applied to these. She continues to have home health. There is no concerns expressed regarding dietary intake, stating she eats 3 meals a day, eating was provided; she is supplemented with boost with protein. 06/03/17-she is here in follow-up evaluation for sacral and bilateral lower extremity ulcers. Plain film x-ray done today reveals no distraction to the sacrum or coccyx, no visible abnormalities. Home health has ordered the negative pressure wound system but it has not been initiated. We will continue with Hydrofera Blue until initiation of negative pressure wound system and continue with Santyl to bilateral lower extremity ulcers. Follow-up next week 06/10/17-she is here in follow-up evaluation for sacral and bilateral lower extremity ulcers. The wound VAC will be available tomorrow per home health. We will initiate wound VAC therapy to the sacral ulcer 3 times weekly (Thursday, Saturday/Sunday, Tuesday). We will continue with Santyl to the lower extremity ulcers. The patient's son is checking into home health therapy over the weekend for [facility] changes, with the understanding that if VAC changes cannot be performed over the weekend he will need to change his mother's appointments to Monday, Wednesday or Friday. The sacral ulcer clinically does not appear infected but there has been a change in the amount of drainage acutely, there is no significant amount of devitalized tissue, there is no malodor. Wound culture was obtained to evaluate for occult infection we will hold off on antibiotic therapy until sensitivities are resulted. 06/18/17 on evaluation today patient appears to be  doing fairly well in my opinion although this is the first time I have seen this patient she has been previously evaluated by Tacey Ruiz here in our office. She is going to be switching to Fridays to see me due to the Wound VAC schedule being changed on Monday, Wednesday, and Friday. Subsequently she seems to be doing fairly well with the Wound VAC. Her son who was present during evaluation today states that he somewhat stresses of the Wound VAC in making sure that it was functioning appropriately. With that being said everything seems to be working  well he knows what settings on the [facility] itself to look at and ensure that it is functioning properly. Overall the wound appears to be nice and clean there is no need for debridement today. She has no discomfort in her bilateral lower extremity ulcers also appear to be improving based on measurements and what this honest tell me about the overall appearance. 07/02/17 on evaluation today I noted in patients wound bed that was actually an odor that had not previously been noted. Subsequently there was also a small area of bone that was not previously noted during my last evaluation. This did appear to be necrotic and was being somewhat forced out by the body around the region of granulation. She does not have any pain which is good news. Nonetheless the overall appearance of the ulcer is making me concerned for the patient having developed osteomyelitis. Currently she is not been on any antibiotics and the Wound VAC has been doing fairly well in general. With that being said I do not think we need to continue the Wound VAC if she potentially has a bone infection. At least not until it is properly addressed with antibiotics. KEVIONNA, HEFFLER ([id number]) 07/09/17 on evaluation today patient appears to actually be doing very well in regard to her sacral ulcer compared to last week. There is actually no exposed bone at this point. Her pathology report showed early  signs of osteomyelitis which had explained to the patient son is definitely good news catching this early is often a key to getting it better without things worsening. With that being said still I do believe she needs to have a referral to infectious disease due to the osteomyelitis I am gonna recommend that she continue with the doxycycline based on the culture results which showed Eikenella Corrodens as the organism identified that tetracycline should work for. She does not seem to be having any discomfort whatsoever at this point. 07/16/17 on evaluation today patient actually appears to be doing rather well in regard to her sacral wound. I think that the original wound site actually appears much better than previously noted. With that being said she does have a new superficial injury on the right sacral region which appears to be due to according to the sign transport that occurred for her MRI unfortunately. Fortunately this is not too deep and I do think it can be managed but it was a new area that was not previously noted. Otherwise she has been tolerating the Dakin s soaked gauze packing without complication. 07/30/17 on evaluation today patient presents for reevaluation concerning her sacral wound. She has been tolerating the dressing changes without complication. With that being said her wound is doing so well at this point that I think she may be at the point where we could reinitiate the Wound VAC currently and hopefully see good results from this. I'm very pleased with how she has responded to the [facility] s soaked gauze packing. Patient History Information obtained from Patient. Family History Hypertension - Father, Lung Disease - Father, No family history of Cancer, Diabetes, Heart Disease, Kidney Disease, Seizures, Stroke, Thyroid Problems, Tuberculosis. Social History Never smoker, Marital Status - Married, Alcohol Use - Never, Drug Use - No History, Caffeine Use - Daily. Medical  History Hospitalization/Surgery History - 01/15/2017, [facility], UTI and yeats infection with pneumonia. Review of Systems (ROS) Constitutional Symptoms (General Health) Denies complaints or symptoms of Fever, Chills. Respiratory The patient has no complaints or symptoms. Cardiovascular The patient has no  complaints or symptoms. Psychiatric The patient has no complaints or symptoms. Objective Constitutional Well-nourished and well-hydrated in no acute distress. Vitals Time Taken: 11:07 AM, Height: 70 in, Temperature: 97.8 F, Pulse: 78 bpm, Respiratory Rate: 18 breaths/min, Blood Pressure: 112/62 mmHg. AUDIA, AMICK. ([id number]) Respiratory normal breathing without difficulty. clear to auscultation bilaterally. Cardiovascular regular rate and rhythm with normal S1, S2. Psychiatric this patient is able to make decisions and demonstrates good insight into disease process. Alert and Oriented x 3. pleasant and cooperative. General Notes: Currently patient has been in the past debrided although I do not see anything that required sharp debridement today. I think that the Dakin's has been doing very well to keep this clean and I think we're at the point of reinitiating the VAC. Integumentary (Hair, Skin) Wound #1 status is Open. Original cause of wound was Pressure Injury. The wound is located on the Medial Sacrum. The wound measures 7.5cm length x 6cm width x 2.5cm depth; 35.343cm^2 area and 88.357cm^3 volume. There is bone, muscle, and Fat Layer (Subcutaneous Tissue) Exposed exposed. There is no tunneling noted, however, there is undermining starting at 1:00 and ending at 9:00 with a maximum distance of 2.2cm. There is a large amount of serous drainage noted. Foul odor after cleansing was noted. The wound margin is distinct with the outline attached to the wound base. There is large (67-100%) red, hyper - granulation within the wound bed. There is a small (1-33%) amount of necrotic  tissue within the wound bed including Eschar and Adherent Slough. The periwound skin appearance exhibited: Induration, Scarring, Erythema. The periwound skin appearance did not exhibit: Callus, Crepitus, Excoriation, Rash, Dry/Scaly, Maceration, Atrophie Blanche, Cyanosis, Ecchymosis, Hemosiderin Staining, Mottled, Pallor, Rubor. The surrounding wound skin color is noted with erythema which is circumferential. Periwound temperature was noted as No Abnormality. The periwound has tenderness on palpation. Wound #2 status is Open. Original cause of wound was Gradually Appeared. The wound is located on the Left Lower Leg. The wound measures 6cm length x 3.2cm width x 0.1cm depth; 15.08cm^2 area and 1.508cm^3 volume. There is Fat Layer (Subcutaneous Tissue) Exposed exposed. There is no tunneling or undermining noted. There is a medium amount of serous drainage noted. The wound margin is flat and intact. There is medium (34-66%) red granulation within the wound bed. There is a medium (34-66%) amount of necrotic tissue within the wound bed including Eschar and Adherent Slough. The periwound skin appearance exhibited: Erythema. The surrounding wound skin color is noted with erythema which is circumferential. Wound #3 status is Open. Original cause of wound was Gradually Appeared. The wound is located on the Right,Lateral Lower Leg. The wound measures 0.1cm length x 0.1cm width x 0.1cm depth; 0.008cm^2 area and 0.001cm^3 volume. There is no tunneling or undermining noted. There is a none present amount of drainage noted. The wound margin is flat and intact. There is no granulation within the wound bed. There is a large (67-100%) amount of necrotic tissue within the wound bed including Eschar. The periwound skin appearance did not exhibit: Callus, Crepitus, Excoriation, Induration, Rash, Scarring, Dry/Scaly, Maceration, Atrophie Blanche, Cyanosis, Ecchymosis, Hemosiderin Staining, Mottled, Pallor, Rubor,  Erythema. Periwound temperature was noted as No Abnormality. The periwound has tenderness on palpation. 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 ([id number])  Procedures Wound #2 Pre-procedure diagnosis of Wound #2 is a Pressure Ulcer located on the Left Lower Leg . There was a Excisional Skin/Subcutaneous Tissue Debridement with a total area of 4 sq cm performed by STONE III, Kenidi Elenbaas E., PA-C. With the following instrument(s): Curette. to remove Viable and Non-Viable tissue/material Material removed includes Eschar, Subcutaneous Tissue, and Slough, and Slough after achieving pain control using Lidocaine 4% Topical Solution. No specimens were taken. A time out was conducted at 11:31, prior to the start of the procedure. A Minimum amount of bleeding was controlled with Pressure. The procedure was tolerated well with a pain level of 0 throughout and a pain level of 0 following the procedure. Post Debridement Measurements: 6cm length x 2.3cm width x 0.4cm depth; 4.335cm^3 volume. Post debridement Stage noted as Category/Stage III. Character of Wound/Ulcer Post Debridement is improved. Post procedure Diagnosis Wound #2: Same as Pre-Procedure Plan Wound Cleansing: Wound #1 Medial Sacrum: Clean wound with Normal Saline. Wound #2 Left Lower Leg: Clean wound with Normal Saline. Wound #3 Right,Lateral Lower Leg: Clean wound with Normal Saline. Anesthetic (add to Medication List): Wound #1 Medial Sacrum: Topical Lidocaine 4% cream applied to wound bed prior to debridement (In Clinic Only). Wound #2 Left Lower Leg: Topical Lidocaine 4% cream applied to wound bed prior to debridement (In Clinic Only). Wound #3 Right,Lateral Lower  Leg: Topical Lidocaine 4% cream applied to wound bed prior to debridement (In Clinic Only). Primary Wound Dressing: Wound #1 Medial Sacrum: Other: - Vashe soaked gauze - given to patient - continue this until [facility] Wound #2 Left Lower Leg: Santyl Ointment - PLACE ON OPEN WOUND AREAS ON LATERAL LOWER LEGS Silver Alginate - over santyl Wound #3 Right,Lateral Lower Leg: Boardered Foam Dressing - and skin prep Secondary Dressing: Wound #1 Medial Sacrum: ABD pad XtraSorb Wound #2 Left Lower Leg: Boardered Foam Dressing - or ABD pad and tape Wound #3 Right,Lateral Lower Leg: Boardered Foam Dressing Dressing Change FrequencyDuard [name]: Aburto, Ebelyn W. ([id number]) Wound #1 Medial Sacrum: Change dressing every day. Wound #2 Left Lower Leg: Change dressing every day. Wound #3 Right,Lateral Lower Leg: Change dressing every day. Follow-up Appointments: Wound #1 Medial Sacrum: Return Appointment in 2 weeks. Wound #2 Left Lower Leg: Return Appointment in 2 weeks. Wound #3 Right,Lateral Lower Leg: Return Appointment in 2 weeks. Off-Loading: Wound #1 Medial Sacrum: Turn and reposition every 2 hours Wound #2 Left Lower Leg: Turn and reposition every 2 hours Wound #3 Right,Lateral Lower Leg: Turn and reposition every 2 hours Additional Orders / Instructions: Wound #1 Medial Sacrum: Increase protein intake. Wound #2 Left Lower Leg: Increase protein intake. Wound #3 Right,Lateral Lower Leg: Increase protein intake. Home Health: Wound #1 Medial Sacrum: Continue Home Health Visits - [facility] Nurse may visit PRN to address patient s wound care needs. FACE TO FACE ENCOUNTER: MEDICARE and MEDICAID PATIENTS: I certify that this patient is under my care and that I had a face-to-face encounter that meets the physician face-to-face encounter requirements with this patient on this date. The encounter with the patient was in whole or in part for the following MEDICAL  CONDITION: (primary reason for Home Healthcare) MEDICAL NECESSITY: I certify, that based on my findings, NURSING services are a medically necessary home health service. HOME BOUND STATUS: I certify that my clinical findings support that this patient is homebound (i.e., Due to illness or injury, pt requires aid of supportive devices such as crutches, cane, wheelchairs, walkers, the use of  special transportation or the assistance of another person to leave their place of residence. There is a normal inability to leave the home and doing so requires considerable and taxing effort. Other absences are for medical reasons / religious services and are infrequent or of short duration when for other reasons). If current dressing causes regression in wound condition, may D/C ordered dressing product/s and apply Normal Saline Moist Dressing daily until next Wound Healing Center / Other MD appointment. Notify Wound Healing Center of regression in wound condition at [phone]. Please direct any NON-WOUND related issues/requests for orders to patient's Primary Care Physician Wound #2 Left Lower Leg: Continue Home Health Visits - [facility] Nurse may visit PRN to address patient s wound care needs. FACE TO FACE ENCOUNTER: MEDICARE and MEDICAID PATIENTS: I certify that this patient is under my care and that I had a face-to-face encounter that meets the physician face-to-face encounter requirements with this patient on this date. The encounter with the patient was in whole or in part for the following MEDICAL CONDITION: (primary reason for Home Healthcare) MEDICAL NECESSITY: I certify, that based on my findings, NURSING services are a medically necessary home health service. HOME BOUND STATUS: I certify that my clinical findings support that this patient is homebound (i.e., Due to illness or injury, pt requires aid of supportive devices such as crutches, cane, wheelchairs, walkers, the use of  special transportation or the assistance of another person to leave their place of residence. There is a normal inability to leave the home and doing so requires considerable and taxing effort. Other absences are for medical reasons / religious services and are infrequent or of short duration when for other reasons). If current dressing causes regression in wound condition, may D/C ordered dressing product/s and apply Normal Saline Moist Dressing daily until next Wound Healing Center / Other MD appointment. Notify Wound Healing Center of regression in wound condition at [phone]. Please direct any NON-WOUND related issues/requests for orders to patient's Primary Care Physician GLENIS, MUSOLF ([id number]) Wound #3 Right,Lateral Lower Leg: Continue Home Health Visits - [facility] Nurse may visit PRN to address patient s wound care needs. FACE TO FACE ENCOUNTER: MEDICARE and MEDICAID PATIENTS: I certify that this patient is under my care and that I had a face-to-face encounter that meets the physician face-to-face encounter requirements with this patient on this date. The encounter with the patient was in whole or in part for the following MEDICAL CONDITION: (primary reason for Home Healthcare) MEDICAL NECESSITY: I certify, that based on my findings, NURSING services are a medically necessary home health service. HOME BOUND STATUS: I certify that my clinical findings support that this patient is homebound (i.e., Due to illness or injury, pt requires aid of supportive devices such as crutches, cane, wheelchairs, walkers, the use of special transportation or the assistance of another person to leave their place of residence. There is a normal inability to leave the home and doing so requires considerable and taxing effort. Other absences are for medical reasons / religious services and are infrequent or of short duration when for other reasons). If current dressing causes regression  in wound condition, may D/C ordered dressing product/s and apply Normal Saline Moist Dressing daily until next Wound Healing Center / Other MD appointment. Notify Wound Healing Center of regression in wound condition at [phone]. Please direct any NON-WOUND related issues/requests for orders to patient's Primary Care Physician Negative Pressure Wound Therapy: Wound #1 Medial Sacrum: Wound VAC  settings at 125/130 mmHg continuous pressure. Use BLACK/GREEN foam to wound cavity. Use WHITE foam to fill any tunnel/s and/or undermining. Change VAC dressing 3 X WEEK. Change canister as indicated when full. Nurse may titrate settings and frequency of dressing changes as clinically indicated. Apply contact layer over base of wound. - on lateral wound ridge For the time being we will keep with the Dakin s soaked gauze packing until such a time as the Wound VAC is restarted. Following that initiation of the Wound VAC we will see how things progress. If it doesn't seem to be as good as we would like I may just go back to what we have been doing currently. Patient son and the patient are in agreement with this plan. Please see above for specific wound care orders. We will see patient for re-evaluation in 1 week(s) here in the clinic. If anything worsens or changes patient will contact our office for additional recommendations. Electronic Signature(s) Signed: 07/30/2017 5:53:05 PM By: Lenda Kelp PA-C Entered By: Lenda Kelp on 07/30/2017 17:34:36 Jessica [name] ([id number]) -------------------------------------------------------------------------------- ROS/PFSH Details [name]. Date of Service: 07/30/2017 11:15 AM Medical Record Number: [id number] Patient Account Number: [id number] Date of Birth/Sex: [birth year] (72 y.o. F) Treating RN: Curtis Sites Primary Care Provider: Annita Brod Other Clinician: Referring Provider: Annita Brod Treating  Provider/Extender: STONE III, Azavion Bouillon Weeks in Treatment: 9 Information Obtained From Patient Wound History Do you currently have one or more open woundso Yes How many open wounds do you currently haveo 3 How have you been treating your wound(s) until nowo 3 months Has your wound(s) ever healed and then re-openedo No Have you had any lab work done in the past montho No Have you tested positive for an antibiotic resistant organism (MRSA, VRE)o No Constitutional Symptoms (General Health) Complaints and Symptoms: Negative for: Fever; Chills Eyes Medical History: Negative for: Cataracts; Glaucoma; Optic Neuritis Hematologic/Lymphatic Medical History: Positive for: Anemia; Lymphedema Negative for: Hemophilia; Human Immunodeficiency Virus; Sickle Cell Disease Respiratory Complaints and Symptoms: No Complaints or Symptoms Medical History: Negative for: Aspiration; Asthma; Chronic Obstructive Pulmonary Disease (COPD); Pneumothorax; Sleep Apnea; Tuberculosis Cardiovascular Complaints and Symptoms: No Complaints or Symptoms Medical History: Positive for: Deep Vein Thrombosis Negative for: Angina; Arrhythmia; Congestive Heart Failure; Coronary Artery Disease; Hypertension; Hypotension; Myocardial Infarction Gastrointestinal Medical History: Negative for: Cirrhosis ; Colitis; Crohnos; Hepatitis A; Hepatitis B; Hepatitis C LEILANI, CESPEDES. ([id number]) Endocrine Medical History: Negative for: Type I Diabetes; Type II Diabetes Genitourinary Medical History: Negative for: End Stage Renal Disease Immunological Medical History: Negative for: Lupus Erythematosus; Raynaudos; Scleroderma Integumentary (Skin) Medical History: Negative for: History of Burn; History of pressure wounds Musculoskeletal Medical History: Negative for: Gout; Rheumatoid Arthritis; Osteoarthritis; Osteomyelitis Neurologic Medical History: Positive for: Quadriplegia Negative for: Dementia; Neuropathy;  Paraplegia; Seizure Disorder Psychiatric Complaints and Symptoms: No Complaints or Symptoms Medical History: Negative for: Anorexia/bulimia; Confinement Anxiety Immunizations Pneumococcal Vaccine: Received Pneumococcal Vaccination: Yes Implantable Devices Hospitalization / Surgery History Name of Hospital Purpose of Hospitalization/Surgery Date [facility] UTI and yeats infection with pneumonia 01/15/2017 Family and Social History Cancer: No; Diabetes: No; Heart Disease: No; Hypertension: Yes - Father; Kidney Disease: No; Lung Disease: Yes - Father; Seizures: No; Stroke: No; Thyroid Problems: No; Tuberculosis: No; Never smoker; Marital Status - Married; Alcohol Use: Never; Drug Use: No History; Caffeine Use: Daily; Financial Concerns: No; Food, Clothing or Shelter Needs: No; Support System Lacking: No; Advanced Directives: No; Patient does not want information on Advanced Directives; Do not resuscitate:  No; Living Will: No; Medical Power of Attorney: No Physician Affirmation I have reviewed and agree with the above information. TESSICA, CUPO ([id number]) Electronic Signature(s) Signed: 07/30/2017 5:53:05 PM By: Lenda Kelp PA-C Signed: 08/03/2017 4:18:46 PM By: Curtis Sites Entered By: Lenda Kelp on 07/30/2017 17:33:02 Jessica [name] ([id number]) -------------------------------------------------------------------------------- SuperBill Details [name]. Date of Service: 07/30/2017 Medical Record Number: [id number] Patient Account Number: [id number] Date of Birth/Sex: 1946/03/09 (72 y.o. F) Treating RN: Curtis Sites Primary Care Provider: Annita Brod Other Clinician: Referring Provider: Annita Brod Treating Provider/Extender: STONE III, Davene Jobin Weeks in Treatment: 9 Diagnosis Coding ICD-10 Codes Code Description L89.154 Pressure ulcer of sacral region, stage 4 M48.00 Spinal stenosis, site unspecified R54 Age-related physical  debility G89.4 Chronic pain syndrome E78.2 Mixed hyperlipidemia L89.95 Pressure ulcer of unspecified site, unstageable Facility Procedures CPT4 Code: [id number] Description: 11042 - DEB SUBQ TISSUE 20 SQ CM/< ICD-10 Diagnosis Description L89.95 Pressure ulcer of unspecified site, unstageable Modifier: Quantity: 1 Physician Procedures CPT4 Code: [id number] Description: 11042 - WC PHYS SUBQ TISS 20 SQ CM ICD-10 Diagnosis Description L89.95 Pressure ulcer of unspecified site, unstageable Modifier: Quantity: 1 Electronic Signature(s) Signed: 07/30/2017 5:53:05 PM By: Lenda Kelp PA-C Entered By: Lenda Kelp on 07/30/2017 17:35:22

## 2017-08-07 NOTE — Progress Notes (Signed)
Jessica, [name] ([id number]) Visit Report for 07/30/2017 Arrival Information Details [name], Jessica [name]. Date of Service: 07/30/2017 11:15 AM Medical Record Number: [id number] Patient Account Number: [id number] Date of Birth/Sex: Aug 26, 1945 (72 y.o. F) Treating RN: Renne Crigler Primary Care Dal Blew: Annita Brod Other Clinician: Referring Stone Spirito: Annita Brod Treating Bryony Kaman/Extender: Linwood Dibbles, HOYT Weeks in Treatment: 9 Visit Information History Since Last Visit All ordered tests and consults were completed: No Patient Arrived: Stretcher Added or deleted any medications: No Arrival Time: 11:05 Any new allergies or adverse reactions: No Accompanied By: son Had a fall or experienced change in No Transfer Assistance: Stretcher activities of daily living that may affect Patient Identification Verified: Yes risk of falls: Secondary Verification Process Completed: Yes Signs or symptoms of abuse/neglect since last visito No Patient Has Alerts: Yes Hospitalized since last visit: No Patient Alerts: allergic to zinc Implantable device outside of the clinic excluding No cellular tissue based products placed in the center since last visit: Pain Present Now: No Electronic Signature(s) Signed: 08/02/2017 4:40:07 PM By: Renne Crigler Entered By: Renne Crigler on 07/30/2017 11:07:05 Jessica [name] ([id number]) -------------------------------------------------------------------------------- Encounter Discharge Information Details [name]. Date of Service: 07/30/2017 11:15 AM Medical Record Number: [id number] Patient Account Number: [id number] Date of Birth/Sex: Oct 16, 1945 (72 y.o. F) Treating RN: Curtis Sites Primary Care Nevayah Faust: Annita Brod Other Clinician: Referring Chimaobi Casebolt: Annita Brod Treating Amalie Koran/Extender: Linwood Dibbles, HOYT Weeks in Treatment: 9 Encounter Discharge Information Items Schedule Follow-up  Appointment: No Medication Reconciliation completed and No provided to Patient/Care Freeman Borba: Provided on Clinical Summary of Care: 07/30/2017 Form Type Recipient Paper Patient PO Electronic Signature(s) Signed: 08/04/2017 3:44:00 PM By: Gwenlyn Perking Entered By: Gwenlyn Perking on 07/30/2017 11:51:44 Jessica [name] ([id number]) -------------------------------------------------------------------------------- Lower Extremity Assessment Details [name]. Date of Service: 07/30/2017 11:15 AM Medical Record Number: [id number] Patient Account Number: [id number] Date of Birth/Sex: 02-Jun-1945 (72 y.o. F) Treating RN: Curtis Sites Primary Care Selene Peltzer: Annita Brod Other Clinician: Referring Kiah Vanalstine: Annita Brod Treating Thalya Fouche/Extender: Linwood Dibbles, HOYT Weeks in Treatment: 9 Electronic Signature(s) Signed: 07/30/2017 3:07:03 PM By: Curtis Sites Entered By: Curtis Sites on 07/30/2017 11:30:17 Jessica [name] ([id number]) -------------------------------------------------------------------------------- Multi Wound Chart Details [name]. Date of Service: 07/30/2017 11:15 AM Medical Record Number: [id number] Patient Account Number: [id number] Date of Birth/Sex: [birth year] (72 y.o. F) Treating RN: Curtis Sites Primary Care Elmor Kost: Annita Brod Other Clinician: Referring Jacie Tristan: Annita Brod Treating Jazir Newey/Extender: STONE III, HOYT Weeks in Treatment: 9 Vital Signs Height(in): 70 Pulse(bpm): 78 Weight(lbs): Blood Pressure(mmHg): 112/62 Body Mass Index(BMI): Temperature(F): 97.8 Respiratory Rate 18 (breaths/min): Photos: [1:No Photos] [2:No Photos] [3:No Photos] Wound Location: [1:Sacrum - Medial] [2:Left Lower Leg] [3:Right Lower Leg - Lateral] Wounding Event: [1:Pressure Injury] [2:Gradually Appeared] [3:Gradually Appeared] Primary Etiology: [1:Pressure Ulcer] [2:Pressure Ulcer] [3:Pressure  Ulcer] Comorbid History: [1:Anemia, Lymphedema, Deep Anemia, Lymphedema, Deep Anemia, Lymphedema, Deep Vein Thrombosis, Quadriplegia Vein Thrombosis, Quadriplegia Vein Thrombosis, Quadriplegia] Date Acquired: [1:01/15/2017] [2:04/28/2017] [3:04/28/2017] Weeks of Treatment: [1:9] [2:9] [3:9] Wound Status: [1:Open] [2:Open] [3:Open] Measurements L x W x D [1:7.5x6x2.5] [2:6x3.2x0.1] [3:0.1x0.1x0.1] (cm) Area (cm) : [1:35.343] [2:15.08] [3:0.008] Volume (cm) : [1:88.357] [2:1.508] [3:0.001] % Reduction in Area: [1:4.10%] [2:-3101.70%] [3:97.20%] % Reduction in Volume: [1:-139.80%] [2:-1504.30%] [3:96.40%] Starting Position 1 [1:1] (o'clock): Ending Position 1 [1:9] (o'clock): Maximum Distance 1 (cm): [1:2.2] Undermining: [1:Yes] [2:No] [3:No] Classification: [1:Category/Stage IV] [2:Category/Stage III] [3:Category/Stage II] Exudate Amount: [1:Large] [2:Medium] [3:None Present] Exudate Type: [1:Serous] [2:Serous] [3:N/A] Exudate Color: [1:amber] [2:amber] [3:N/A] Foul  Odor After Cleansing: [1:Yes] [2:No] [3:No] Odor Anticipated Due to [1:No] [2:N/A] [3:N/A] Product Use: Wound Margin: [1:Distinct, outline attached] [2:Flat and Intact] [3:Flat and Intact] Granulation Amount: [1:Large (67-100%)] [2:Medium (34-66%)] [3:None Present (0%)] Granulation Quality: [1:Red, Hyper-granulation] [2:Red] [3:N/A] Necrotic Amount: [1:Small (1-33%)] [2:Medium (34-66%)] [3:Large (67-100%)] Necrotic Tissue: [1:Eschar, Adherent Slough] [2:Eschar, Adherent Slough] [3:Eschar] Exposed Structures: [1:Fat Layer (Subcutaneous Tissue) Exposed: Yes Muscle: Yes Bone: Yes] [2:Fat Layer (Subcutaneous Tissue) Exposed: Yes Fascia: No Tendon: No] [3:Fascia: No Fat Layer (Subcutaneous Tissue) Exposed: No Tendon: No] Fascia: No Muscle: No Muscle: No Tendon: No Joint: No Joint: No Joint: No Bone: No Bone: No Epithelialization: None Small (1-33%) Large (67-100%) Periwound Skin Texture: Induration: Yes No  Abnormalities Noted Excoriation: No Scarring: Yes Induration: No Excoriation: No Callus: No Callus: No Crepitus: No Crepitus: No Rash: No Rash: No Scarring: No Periwound Skin Moisture: Maceration: No No Abnormalities Noted Maceration: No Dry/Scaly: No Dry/Scaly: No Periwound Skin Color: Erythema: Yes Erythema: Yes Atrophie Blanche: No Atrophie Blanche: No Cyanosis: No Cyanosis: No Ecchymosis: No Ecchymosis: No Erythema: No Hemosiderin Staining: No Hemosiderin Staining: No Mottled: No Mottled: No Pallor: No Pallor: No Rubor: No Rubor: No Erythema Location: Circumferential Circumferential N/A Temperature: No Abnormality N/A No Abnormality Tenderness on Palpation: Yes No Yes Wound Preparation: Ulcer Cleansing: Ulcer Cleansing: Ulcer Cleansing: Rinsed/Irrigated with Saline Rinsed/Irrigated with Saline Rinsed/Irrigated with Saline Topical Anesthetic Applied: Topical Anesthetic Applied: Topical Anesthetic Applied: Other: lidocaine 4% Other: lidocaine 4% None Treatment Notes Electronic Signature(s) Signed: 07/30/2017 3:07:03 PM By: Curtis Sites Entered By: Curtis Sites on 07/30/2017 11:30:34 Jessica [name] ([id number]) -------------------------------------------------------------------------------- Multi-Disciplinary Care Plan Details [name]. Date of Service: 07/30/2017 11:15 AM Medical Record Number: [id number] Patient Account Number: [id number] Date of Birth/Sex: Aug 20, 1945 (72 y.o. F) Treating RN: Curtis Sites Primary Care Caprina Wussow: Annita Brod Other Clinician: Referring Jimmye Wisnieski: Annita Brod Treating Maleek Craver/Extender: STONE III, HOYT Weeks in Treatment: 9 Active Inactive ` Orientation to the Wound Care Program Nursing Diagnoses: Knowledge deficit related to the wound healing center program Goals: Patient/caregiver will verbalize understanding of the Wound Healing Center Program Date Initiated: 06/03/2017 Target  Resolution Date: 06/17/2017 Goal Status: Active Interventions: Provide education on orientation to the wound center Notes: ` Pressure Nursing Diagnoses: Knowledge deficit related to causes and risk factors for pressure ulcer development Knowledge deficit related to management of pressures ulcers Potential for impaired tissue integrity related to pressure, friction, moisture, and shear Goals: Patient will remain free from development of additional pressure ulcers Date Initiated: 06/03/2017 Target Resolution Date: 06/17/2017 Goal Status: Active Patient will remain free of pressure ulcers Date Initiated: 06/03/2017 Target Resolution Date: 06/17/2017 Goal Status: Active Patient/caregiver will verbalize risk factors for pressure ulcer development Date Initiated: 06/03/2017 Target Resolution Date: 06/17/2017 Goal Status: Active Interventions: Assess: immobility, friction, shearing, incontinence upon admission and as needed Assess potential for pressure ulcer upon admission and as needed Notes: ` Wound/Skin Impairment Jessica [name], Jessica [name] ([id number]) Nursing Diagnoses: Impaired tissue integrity Goals: Patient/caregiver will verbalize understanding of skin care regimen Date Initiated: 06/03/2017 Target Resolution Date: 06/17/2017 Goal Status: Active Ulcer/skin breakdown will have a volume reduction of 30% by week 4 Date Initiated: 06/03/2017 Target Resolution Date: 06/17/2017 Goal Status: Active Interventions: Assess ulceration(s) every visit Treatment Activities: Patient referred to home care : 06/03/2017 Skin care regimen initiated : 06/03/2017 Notes: Electronic Signature(s) Signed: 07/30/2017 3:07:03 PM By: Curtis Sites Entered By: Curtis Sites on 07/30/2017 11:30:22 Jessica [name] ([id number]) -------------------------------------------------------------------------------- Pain Assessment Details [name]. Date of Service:  07/30/2017 11:15 AM Medical  Record Number: [id number] Patient Account Number: [id number] Date of Birth/Sex: Feb 09, 1946 (72 y.o. F) Treating RN: Renne Crigler Primary Care Chynna Buerkle: Annita Brod Other Clinician: Referring Criag Wicklund: Annita Brod Treating Millette Halberstam/Extender: STONE III, HOYT Weeks in Treatment: 9 Active Problems Location of Pain Severity and Description of Pain Patient Has Paino No Site Locations Pain Management and Medication Current Pain Management: Electronic Signature(s) Signed: 08/02/2017 4:40:07 PM By: Renne Crigler Entered By: Renne Crigler on 07/30/2017 11:07:13 Jessica [name] ([id number]) -------------------------------------------------------------------------------- Wound Assessment Details [name]. Date of Service: 07/30/2017 11:15 AM Medical Record Number: [id number] Patient Account Number: [id number] Date of Birth/Sex: May 29, 1945 (72 y.o. F) Treating RN: Renne Crigler Primary Care Tajah Noguchi: Annita Brod Other Clinician: Referring Alleyne Lac: Annita Brod Treating Anish Vana/Extender: STONE III, HOYT Weeks in Treatment: 9 Wound Status Wound Number: 1 Primary Pressure Ulcer Etiology: Wound Location: Sacrum - Medial Wound Status: Open Wounding Event: Pressure Injury Comorbid Anemia, Lymphedema, Deep Vein Date Acquired: 01/15/2017 History: Thrombosis, Quadriplegia Weeks Of Treatment: 9 Clustered Wound: No Photos Photo Uploaded By: Renne Crigler on 07/30/2017 16:07:33 Wound Measurements Length: (cm) 7.5 Width: (cm) 6 Depth: (cm) 2.5 Area: (cm) 35.343 Volume: (cm) 88.357 % Reduction in Area: 4.1% % Reduction in Volume: -139.8% Epithelialization: None Tunneling: No Undermining: Yes Starting Position (o'clock): 1 Ending Position (o'clock): 9 Maximum Distance: (cm) 2.2 Wound Description Classification: Category/Stage IV Wound Margin: Distinct, outline attached Exudate Amount: Large Exudate Type: Serous Exudate Color:  amber Foul Odor After Cleansing: Yes Due to Product Use: No Slough/Fibrino Yes Wound Bed Granulation Amount: Large (67-100%) Exposed Structure Granulation Quality: Red, Hyper-granulation Fascia Exposed: No Necrotic Amount: Small (1-33%) Fat Layer (Subcutaneous Tissue) Exposed: Yes Necrotic Quality: Eschar, Adherent Slough Tendon Exposed: No Muscle Exposed: Yes Jessica [name], Jessica [name]. ([id number]) Necrosis of Muscle: No Joint Exposed: No Bone Exposed: Yes Periwound Skin Texture Texture Color No Abnormalities Noted: No No Abnormalities Noted: No Callus: No Atrophie Blanche: No Crepitus: No Cyanosis: No Excoriation: No Ecchymosis: No Induration: Yes Erythema: Yes Rash: No Erythema Location: Circumferential Scarring: Yes Hemosiderin Staining: No Mottled: No Moisture Pallor: No No Abnormalities Noted: No Rubor: No Dry / Scaly: No Maceration: No Temperature / Pain Temperature: No Abnormality Tenderness on Palpation: Yes Wound Preparation Ulcer Cleansing: Rinsed/Irrigated with Saline Topical Anesthetic Applied: Other: lidocaine 4%, Electronic Signature(s) Signed: 08/02/2017 4:40:07 PM By: Renne Crigler Entered By: Renne Crigler on 07/30/2017 11:20:00 Jessica [name] ([id number]) -------------------------------------------------------------------------------- Wound Assessment Details [name]. Date of Service: 07/30/2017 11:15 AM Medical Record Number: [id number] Patient Account Number: [id number] Date of Birth/Sex: 05-03-1945 (72 y.o. F) Treating RN: Renne Crigler Primary Care Valborg Friar: Annita Brod Other Clinician: Referring Khalie Wince: Annita Brod Treating Samyak Sackmann/Extender: STONE III, HOYT Weeks in Treatment: 9 Wound Status Wound Number: 2 Primary Pressure Ulcer Etiology: Wound Location: Left Lower Leg Wound Status: Open Wounding Event: Gradually Appeared Comorbid Anemia, Lymphedema, Deep Vein Date Acquired:  04/28/2017 History: Thrombosis, Quadriplegia Weeks Of Treatment: 9 Clustered Wound: No Photos Photo Uploaded By: Renne Crigler on 07/30/2017 16:07:33 Wound Measurements Length: (cm) 6 Width: (cm) 3.2 Depth: (cm) 0.1 Area: (cm) 15.08 Volume: (cm) 1.508 % Reduction in Area: -3101.7% % Reduction in Volume: -1504.3% Epithelialization: Small (1-33%) Tunneling: No Undermining: No Wound Description Classification: Category/Stage III Wound Margin: Flat and Intact Exudate Amount: Medium Exudate Type: Serous Exudate Color: amber Foul Odor After Cleansing: No Slough/Fibrino Yes Wound Bed Granulation Amount: Medium (34-66%) Exposed Structure Granulation Quality: Red Fascia Exposed: No Necrotic Amount: Medium (34-66%) Fat Layer (Subcutaneous Tissue)  Exposed: Yes Necrotic Quality: Eschar, Adherent Slough Tendon Exposed: No Muscle Exposed: No Joint Exposed: No Bone Exposed: No Periwound Skin Texture Jessica [name], Jessica [name]. ([id number]) Texture Color No Abnormalities Noted: No No Abnormalities Noted: No Erythema: Yes Moisture Erythema Location: Circumferential No Abnormalities Noted: No Wound Preparation Ulcer Cleansing: Rinsed/Irrigated with Saline Topical Anesthetic Applied: Other: lidocaine 4%, Electronic Signature(s) Signed: 08/02/2017 4:40:07 PM By: Renne Crigler Entered By: Renne Crigler on 07/30/2017 11:11:19 Jessica [name] ([id number]) -------------------------------------------------------------------------------- Wound Assessment Details [name]. Date of Service: 07/30/2017 11:15 AM Medical Record Number: [id number] Patient Account Number: [id number] Date of Birth/Sex: January 29, 1946 (72 y.o. F) Treating RN: Renne Crigler Primary Care Kadeidra Coryell: Annita Brod Other Clinician: Referring Taesean Reth: Annita Brod Treating [name] [name]/Extender: STONE III, HOYT Weeks in Treatment: 9 Wound Status Wound Number: 3 Primary Pressure  Ulcer Etiology: Wound Location: Right Lower Leg - Lateral Wound Status: Open Wounding Event: Gradually Appeared Comorbid Anemia, Lymphedema, Deep Vein Date Acquired: 04/28/2017 History: Thrombosis, Quadriplegia Weeks Of Treatment: 9 Clustered Wound: No Photos Photo Uploaded By: Renne Crigler on 07/30/2017 16:08:23 Wound Measurements Length: (cm) 0.1 Width: (cm) 0.1 Depth: (cm) 0.1 Area: (cm) 0.008 Volume: (cm) 0.001 % Reduction in Area: 97.2% % Reduction in Volume: 96.4% Epithelialization: Large (67-100%) Tunneling: No Undermining: No Wound Description Classification: Category/Stage II Wound Margin: Flat and Intact Exudate Amount: None Present Foul Odor After Cleansing: No Slough/Fibrino No Wound Bed Granulation Amount: None Present (0%) Exposed Structure Necrotic Amount: Large (67-100%) Fascia Exposed: No Necrotic Quality: Eschar Fat Layer (Subcutaneous Tissue) Exposed: No Tendon Exposed: No Muscle Exposed: No Joint Exposed: No Bone Exposed: No Periwound Skin Texture Texture Color No Abnormalities Noted: No No Abnormalities Noted: No Jessica [name], Jessica [name] ([id number]) Callus: No Atrophie Blanche: No Crepitus: No Cyanosis: No Excoriation: No Ecchymosis: No Induration: No Erythema: No Rash: No Hemosiderin Staining: No Scarring: No Mottled: No Pallor: No Moisture Rubor: No No Abnormalities Noted: No Dry / Scaly: No Temperature / Pain Maceration: No Temperature: No Abnormality Tenderness on Palpation: Yes Wound Preparation Ulcer Cleansing: Rinsed/Irrigated with Saline Topical Anesthetic Applied: None Electronic Signature(s) Signed: 08/02/2017 4:40:07 PM By: Renne Crigler Entered By: Renne Crigler on 07/30/2017 11:11:54 Jessica [name] ([id number]) -------------------------------------------------------------------------------- Vitals Details [name]. Date of Service: 07/30/2017 11:15 AM Medical Record Number:  [id number] Patient Account Number: [id number] Date of Birth/Sex: 07/28/1945 (72 y.o. F) Treating RN: Renne Crigler Primary Care Jonice Cerra: Annita Brod Other Clinician: Referring Lannah Koike: Annita Brod Treating Zelma Mazariego/Extender: STONE III, HOYT Weeks in Treatment: 9 Vital Signs Time Taken: 11:07 Temperature (F): 97.8 Height (in): 70 Pulse (bpm): 78 Respiratory Rate (breaths/min): 18 Blood Pressure (mmHg): 112/62 Reference Range: 80 - 120 mg / dl Electronic Signature(s) Signed: 08/02/2017 4:40:07 PM By: Renne Crigler Entered By: Renne Crigler on 07/30/2017 11:10:11

## 2017-08-11 ENCOUNTER — Telehealth: Payer: Self-pay | Admitting: Urology

## 2017-08-11 NOTE — Telephone Encounter (Signed)
Spoke w/Jessica [name] and she states it has only leaked 3 times in a month and it may be positional , cath is draining fine. It was explained that we would not like to up size in a chronic foley due to possible erosion . Jessica [name] states she understands and will notify us if leakage continues or increases.

## 2017-08-11 NOTE — Telephone Encounter (Signed)
Home health calls office stating that pts foley is leaking, nurse would like to know if she can change her foley from 16 to and 18 but will need verbal orders.  Please call Maralyn Sago [phone]

## 2017-08-13 ENCOUNTER — Encounter: Payer: Medicare Other | Attending: Physician Assistant | Admitting: Physician Assistant

## 2017-08-13 DIAGNOSIS — G894 Chronic pain syndrome: Secondary | ICD-10-CM | POA: Insufficient documentation

## 2017-08-13 DIAGNOSIS — Z7401 Bed confinement status: Secondary | ICD-10-CM | POA: Diagnosis not present

## 2017-08-13 DIAGNOSIS — L97829 Non-pressure chronic ulcer of other part of left lower leg with unspecified severity: Secondary | ICD-10-CM | POA: Diagnosis not present

## 2017-08-13 DIAGNOSIS — M4800 Spinal stenosis, site unspecified: Secondary | ICD-10-CM | POA: Diagnosis not present

## 2017-08-13 DIAGNOSIS — L89154 Pressure ulcer of sacral region, stage 4: Secondary | ICD-10-CM | POA: Insufficient documentation

## 2017-08-19 NOTE — Progress Notes (Signed)
Jessica [name] ([id number]) Visit Report for 08/13/2017 Chief Complaint Document Details [name], Jessica [name]. Date of Service: 08/13/2017 11:15 AM Medical Record Number: [id number] Patient Account Number: [id number] Date of Birth/Sex: 23-Oct-1945 (72 y.o. F) Treating RN: Curtis Sites Primary Care Provider: Annita Brod Other Clinician: Referring Provider: Annita Brod Treating Provider/Extender: Linwood Dibbles, HOYT Weeks in Treatment: 11 Information Obtained from: Patient Chief Complaint she is here for evaluation of a sacral ulcer and bilateral lower extremity ulcers Electronic Signature(s) Signed: 08/17/2017 5:56:33 PM By: Lenda Kelp PA-C Entered By: Lenda Kelp on 08/13/2017 11:03:59 Jessica [name] ([id number]) -------------------------------------------------------------------------------- Debridement Details [name]. Date of Service: 08/13/2017 11:15 AM Medical Record Number: [id number] Patient Account Number: [id number] Date of Birth/Sex: 1945/06/19 (72 y.o. F) Treating RN: Curtis Sites Primary Care Provider: Annita Brod Other Clinician: Referring Provider: Annita Brod Treating Provider/Extender: STONE III, HOYT Weeks in Treatment: 11 Debridement Performed for Wound #1 Medial Sacrum Assessment: Performed By: Physician STONE III, HOYT E., PA-C Debridement Type: Debridement Pre-procedure Verification/Time Yes - 11:58 Out Taken: Start Time: 11:58 Pain Control: Lidocaine 4% Topical Solution Total Area Debrided (L x W): 3 (cm) x 1 (cm) = 3 (cm) Tissue and other material Viable, Non-Viable, Slough, Subcutaneous, Fibrin/Exudate, Slough debrided: Level: Skin/Subcutaneous Tissue Debridement Description: Excisional Instrument: Curette Bleeding: Minimum Hemostasis Achieved: Pressure End Time: 12:04 Procedural Pain: 0 Post Procedural Pain: 0 Response to Treatment: Procedure was tolerated well Post Debridement  Measurements of Total Wound Length: (cm) 8.5 Stage: Category/Stage IV Width: (cm) 4 Depth: (cm) 1.9 Volume: (cm) 50.737 Character of Wound/Ulcer Post Improved Debridement: Post Procedure Diagnosis Same as Pre-procedure Electronic Signature(s) Signed: 08/13/2017 4:48:08 PM By: Curtis Sites Signed: 08/17/2017 5:56:33 PM By: Lenda Kelp PA-C Entered By: Curtis Sites on 08/13/2017 12:03:40 Jessica [name] ([id number]) -------------------------------------------------------------------------------- HPI Details [name]. Date of Service: 08/13/2017 11:15 AM Medical Record Number: [id number] Patient Account Number: [id number] Date of Birth/Sex: [birth year] (72 y.o. F) Treating RN: Curtis Sites Primary Care Provider: Annita Brod Other Clinician: Referring Provider: Annita Brod Treating Provider/Extender: Linwood Dibbles, HOYT Weeks in Treatment: 11 History of Present Illness HPI Description: 05/27/17-she is here in initial evaluation for a left-sided sacral stage IV pressure ulcer and bilateral lower extremity, lateral aspect, unstageable pressure ulcers. She is accompanied by her husband and her son, who are her primary caregivers. She is bedbound secondary to spinal stenosis. According to her son and husband she was hospitalized from 10/5-11/1 with healthcare associated pneumonia and altered mental status. During her hospitalization she was intubated, extubated on 10/27. She was discharged with Foley catheter and follow up with urology. According to her son and spouse she developed the sacral ulcer during hospitalization. Home health has been applying Santyl daily. She does have a low air loss mattress and is repositioned every 2-3 hours per her family's report. According to the son and spouse she had an appointment with urology on 12/18 and during that appointment developed discoloration to her bilateral lower extremities which ultimately developed into  unstageable pressure ulcers to the lateral aspects of her bilateral lower extremities. There has been no topical treatment applied to these. She continues to have home health. There is no concerns expressed regarding dietary intake, stating she eats 3 meals a day, eating was provided; she is supplemented with boost with protein. 06/03/17-she is here in follow-up evaluation for sacral and bilateral lower extremity ulcers. Plain film x-ray done today reveals no distraction to the sacrum or coccyx, no  visible abnormalities. Home health has ordered the negative pressure wound system but it has not been initiated. We will continue with Hydrofera Blue until initiation of negative pressure wound system and continue with Santyl to bilateral lower extremity ulcers. Follow-up next week 06/10/17-she is here in follow-up evaluation for sacral and bilateral lower extremity ulcers. The wound VAC will be available tomorrow per home health. We will initiate wound VAC therapy to the sacral ulcer 3 times weekly (Thursday, Saturday/Sunday, Tuesday). We will continue with Santyl to the lower extremity ulcers. The patient's son is checking into home health therapy over the weekend for [facility] changes, with the understanding that if VAC changes cannot be performed over the weekend he will need to change his mother's appointments to Monday, Wednesday or Friday. The sacral ulcer clinically does not appear infected but there has been a change in the amount of drainage acutely, there is no significant amount of devitalized tissue, there is no malodor. Wound culture was obtained to evaluate for occult infection we will hold off on antibiotic therapy until sensitivities are resulted. 06/18/17 on evaluation today patient appears to be doing fairly well in my opinion although this is the first time I have seen this patient she has been previously evaluated by Tacey Ruiz here in our office. She is going to be switching to Fridays to see me due  to the Wound VAC schedule being changed on Monday, Wednesday, and Friday. Subsequently she seems to be doing fairly well with the Wound VAC. Her son who was present during evaluation today states that he somewhat stresses of the Wound VAC in making sure that it was functioning appropriately. With that being said everything seems to be working well he knows what settings on the [facility] itself to look at and ensure that it is functioning properly. Overall the wound appears to be nice and clean there is no need for debridement today. She has no discomfort in her bilateral lower extremity ulcers also appear to be improving based on measurements and what this honest tell me about the overall appearance. 07/02/17 on evaluation today I noted in patients wound bed that was actually an odor that had not previously been noted. Subsequently there was also a small area of bone that was not previously noted during my last evaluation. This did appear to be necrotic and was being somewhat forced out by the body around the region of granulation. She does not have any pain which is good news. Nonetheless the overall appearance of the ulcer is making me concerned for the patient having developed osteomyelitis. Currently she is not been on any antibiotics and the Wound VAC has been doing fairly well in general. With that being said I do not think we need to continue the Wound VAC if she potentially has a bone infection. At least not until it is properly addressed with antibiotics. 07/09/17 on evaluation today patient appears to actually be doing very well in regard to her sacral ulcer compared to last week. There is actually no exposed bone at this point. 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 With that being said still I do believe she needs to have a referral to  infectious disease due to the osteomyelitis I am gonna recommend that she continue with the doxycycline based on the culture results which showed Eikenella Corrodens as the Bruss, Reghan  W. ([id number]) organism identified that tetracycline should work for. She does not seem to be having any discomfort whatsoever at this point. 07/16/17 on evaluation today patient actually appears to be doing rather well in regard to her sacral wound. I think that the original wound site actually appears much better than previously noted. With that being said she does have a new superficial injury on the right sacral region which appears to be due to according to the sign transport that occurred for her MRI unfortunately. Fortunately this is not too deep and I do think it can be managed but it was a new area that was not previously noted. Otherwise she has been tolerating the Dakinos soaked gauze packing without complication. 07/30/17 on evaluation today patient presents for reevaluation concerning her sacral wound. She has been tolerating the dressing changes without complication. With that being said her wound is doing so well at this point that I think she may be at the point where we could reinitiate the Wound VAC currently and hopefully see good results from this. I'm very pleased with how she has responded to the Dakinos soaked gauze packing. 08/13/17 evaluation today patient appears to be doing excellent in regard to her lower extremity ulcer this seems to be cleaning up very nicely. In regard to the sacral ulcer the area of trauma on the right lateral portion of the wound actually appears to be much better than previously noted during the last evaluation. The word has definitely filled in and the Wound VAC appears to be helpful I do believe. Overall I'm very pleased with how things seem to be progressing at this time. Patient likewise is also happy that things are doing well. Electronic Signature(s) Signed:  08/17/2017 5:56:33 PM By: Lenda Kelp PA-C Entered By: Lenda Kelp on 08/13/2017 13:07:43 Jessica [name] ([id number]) -------------------------------------------------------------------------------- Physical Exam Details [name]. Date of Service: 08/13/2017 11:15 AM Medical Record Number: [id number] Patient Account Number: [id number] Date of Birth/Sex: 05/10/1945 (72 y.o. F) Treating RN: Curtis Sites Primary Care Provider: Annita Brod Other Clinician: Referring Provider: Annita Brod Treating Provider/Extender: STONE III, HOYT Weeks in Treatment: 11 Constitutional Well-nourished and well-hydrated in no acute distress. Respiratory normal breathing without difficulty. Psychiatric this patient is able to make decisions and demonstrates good insight into disease process. Alert and Oriented x 3. pleasant and cooperative. Notes Patients world in regard to her sacrum appears to show signs of good granulation and filling in overall I am happy in that regard. With that being said her lower extremity ulcer on the left seems to be doing well with the sandal and I think we will continue with this currently. Electronic Signature(s) Signed: 08/17/2017 5:56:33 PM By: Lenda Kelp PA-C Entered By: Lenda Kelp on 08/13/2017 13:09:58 Jessica [name] ([id number]) -------------------------------------------------------------------------------- Physician Orders Details [name]. Date of Service: 08/13/2017 11:15 AM Medical Record Number: [id number] Patient Account Number: [id number] Date of Birth/Sex: Jan 29, 1946 (72 y.o. F) Treating RN: Curtis Sites Primary Care Provider: Annita Brod Other Clinician: Referring Provider: Annita Brod Treating Provider/Extender: Linwood Dibbles, HOYT Weeks in Treatment: 11 Verbal / Phone Orders: No Diagnosis Coding ICD-10 Coding Code Description L89.154 Pressure ulcer of sacral region, stage  4 M48.00 Spinal stenosis, site unspecified R54 Age-related physical debility G89.4 Chronic pain syndrome E78.2 Mixed hyperlipidemia L89.95 Pressure ulcer of unspecified site, unstageable Wound Cleansing Wound #1 Medial Sacrum o Clean wound with Normal Saline. Wound #2 Left Lower Leg o Clean wound with  Normal Saline. Anesthetic (add to Medication List) Wound #1 Medial Sacrum o Topical Lidocaine 4% cream applied to wound bed prior to debridement (In Clinic Only). Wound #2 Left Lower Leg o Topical Lidocaine 4% cream applied to wound bed prior to debridement (In Clinic Only). Primary Wound Dressing Wound #2 Left Lower Leg o Santyl Ointment - PLACE ON OPEN WOUND AREAS ON LEFT LATERAL LOWER LEG o Silver Alginate - over santyl Secondary Dressing Wound #2 Left Lower Leg o Boardered Foam Dressing - or ABD pad and tape Dressing Change Frequency Wound #1 Medial Sacrum o Change Dressing Monday, Wednesday, Friday Wound #2 Left Lower Leg o Change dressing every day. Follow-up Appointments Wound #1 Medial Sacrum LYNN, RECENDIZ. ([id number]) o Return Appointment in 2 weeks. Wound #2 Left Lower Leg o Return Appointment in 2 weeks. Off-Loading Wound #1 Medial Sacrum o Turn and reposition every 2 hours Wound #2 Left Lower Leg o Turn and reposition every 2 hours Additional Orders / Instructions Wound #1 Medial Sacrum o Increase protein intake. Wound #2 Left Lower Leg o Increase protein intake. Home Health Wound #1 Medial Sacrum o Continue Home Health Visits - [facility] o Home Health Nurse may visit PRN to address patientos wound care needs. o FACE TO FACE ENCOUNTER: MEDICARE and MEDICAID PATIENTS: I certify that this patient is under my care and that I had a face-to-face encounter that meets the physician face-to-face encounter requirements with this patient on this date. The encounter with the patient was in whole or in part for the following  MEDICAL CONDITION: (primary reason for Home Healthcare) MEDICAL NECESSITY: I certify, that based on my findings, NURSING services are a medically necessary home health service. HOME BOUND STATUS: I certify that my clinical findings support that this patient is homebound (i.e., Due to illness or injury, pt requires aid of supportive devices such as crutches, cane, wheelchairs, walkers, the use of special transportation or the assistance of another person to leave their place of residence. There is a normal inability to leave the home and doing so requires considerable and taxing effort. Other absences are for medical reasons / religious services and are infrequent or of short duration when for other reasons). o If current dressing causes regression in wound condition, may D/C ordered dressing product/s and apply Normal Saline Moist Dressing daily until next Wound Healing Center / Other MD appointment. Notify Wound Healing Center of regression in wound condition at [phone]. o Please direct any NON-WOUND related issues/requests for orders to patient's Primary Care Physician Wound #2 Left Lower Leg o Continue Home Health Visits - [facility] o Home Health Nurse may visit PRN to address patientos wound care needs. o FACE TO FACE ENCOUNTER: MEDICARE and MEDICAID PATIENTS: I certify that this patient is under my care and that I had a face-to-face encounter that meets the physician face-to-face encounter requirements with this patient on this date. The encounter with the patient was in whole or in part for the following MEDICAL CONDITION: (primary reason for Home Healthcare) MEDICAL NECESSITY: I certify, that based on my findings, NURSING services are a medically necessary home health service. HOME BOUND STATUS: I certify that my clinical findings support that this patient is homebound (i.e., Due to illness or injury, pt requires aid of supportive devices such as crutches, cane, wheelchairs,  walkers, the use of special transportation or the assistance of another person to leave their place of residence. There is a normal inability to leave the home and doing so requires considerable and  taxing effort. Other absences are for medical reasons / religious services and are infrequent or of short duration when for other reasons). o If current dressing causes regression in wound condition, may D/C ordered dressing product/s and apply Normal Saline Moist Dressing daily until next Wound Healing Center / Other MD appointment. Notify Wound Healing Center of regression in wound condition at [phone]. o Please direct any NON-WOUND related issues/requests for orders to patient's Primary Care Physician Jessica [name], Jessica [name]. ([id number]) Negative Pressure Wound Therapy Wound #1 Medial Sacrum o Wound VAC settings at 125/130 mmHg continuous pressure. Use BLACK/GREEN foam to wound cavity. Use WHITE foam to fill any tunnel/s and/or undermining. Change VAC dressing 3 X WEEK. Change canister as indicated when full. Nurse may titrate settings and frequency of dressing changes as clinically indicated. Electronic Signature(s) Signed: 08/13/2017 4:48:08 PM By: Curtis Sites Signed: 08/17/2017 5:56:33 PM By: Lenda Kelp PA-C Entered By: Curtis Sites on 08/13/2017 12:05:39 Jessica [name] ([id number]) -------------------------------------------------------------------------------- Problem List Details [name]. Date of Service: 08/13/2017 11:15 AM Medical Record Number: [id number] Patient Account Number: [id number] Date of Birth/Sex: 1945/08/16 (72 y.o. F) Treating RN: Curtis Sites Primary Care Provider: Annita Brod Other Clinician: Referring Provider: Annita Brod Treating Provider/Extender: STONE III, HOYT Weeks in Treatment: 11 Active Problems ICD-10 Impacting Encounter Code Description Active Date Wound Healing Diagnosis L89.154 Pressure ulcer of sacral  region, stage 4 05/27/2017 Yes M48.00 Spinal stenosis, site unspecified 05/27/2017 Yes R54 Age-related physical debility 05/27/2017 Yes G89.4 Chronic pain syndrome 05/27/2017 Yes E78.2 Mixed hyperlipidemia 05/27/2017 Yes L89.95 Pressure ulcer of unspecified site, unstageable 05/27/2017 Yes Inactive Problems Resolved Problems Electronic Signature(s) Signed: 08/17/2017 5:56:33 PM By: Lenda Kelp PA-C Entered By: Lenda Kelp on 08/13/2017 11:03:52 Jessica [name] ([id number]) -------------------------------------------------------------------------------- Progress Note Details [name]. Date of Service: 08/13/2017 11:15 AM Medical Record Number: [id number] Patient Account Number: [id number] Date of Birth/Sex: 1945/09/08 (72 y.o. F) Treating RN: Curtis Sites Primary Care Provider: Annita Brod Other Clinician: Referring Provider: Annita Brod Treating Provider/Extender: Linwood Dibbles, HOYT Weeks in Treatment: 11 Subjective Chief Complaint Information obtained from Patient she is here for evaluation of a sacral ulcer and bilateral lower extremity ulcers History of Present Illness (HPI) 05/27/17-she is here in initial evaluation for a left-sided sacral stage IV pressure ulcer and bilateral lower extremity, lateral aspect, unstageable pressure ulcers. She is accompanied by her husband and her son, who are her primary caregivers. She is bedbound secondary to spinal stenosis. According to her son and husband she was hospitalized from 10/5-11/1 with healthcare associated pneumonia and altered mental status. During her hospitalization she was intubated, extubated on 10/27. She was discharged with Foley catheter and follow up with urology. According to her son and spouse she developed the sacral ulcer during hospitalization. Home health has been applying Santyl daily. She does have a low air loss mattress and is repositioned every 2-3 hours per her family's  report. According to the son and spouse she had an appointment with urology on 12/18 and during that appointment developed discoloration to her bilateral lower extremities which ultimately developed into unstageable pressure ulcers to the lateral aspects of her bilateral lower extremities. There has been no topical treatment applied to these. She continues to have home health. There is no concerns expressed regarding dietary intake, stating she eats 3 meals a day, eating was provided; she is supplemented with boost with protein. 06/03/17-she is here in follow-up evaluation for sacral and bilateral lower extremity ulcers. Plain  film x-ray done today reveals no distraction to the sacrum or coccyx, no visible abnormalities. Home health has ordered the negative pressure wound system but it has not been initiated. We will continue with Hydrofera Blue until initiation of negative pressure wound system and continue with Santyl to bilateral lower extremity ulcers. Follow-up next week 06/10/17-she is here in follow-up evaluation for sacral and bilateral lower extremity ulcers. The wound VAC will be available tomorrow per home health. We will initiate wound VAC therapy to the sacral ulcer 3 times weekly (Thursday, Saturday/Sunday, Tuesday). We will continue with Santyl to the lower extremity ulcers. The patient's son is checking into home health therapy over the weekend for [facility] changes, with the understanding that if VAC changes cannot be performed over the weekend he will need to change his mother's appointments to Monday, Wednesday or Friday. The sacral ulcer clinically does not appear infected but there has been a change in the amount of drainage acutely, there is no significant amount of devitalized tissue, there is no malodor. Wound culture was obtained to evaluate for occult infection we will hold off on antibiotic therapy until sensitivities are resulted. 06/18/17 on evaluation today patient appears to be  doing fairly well in my opinion although this is the first time I have seen this patient she has been previously evaluated by Tacey Ruiz here in our office. She is going to be switching to Fridays to see me due to the Wound VAC schedule being changed on Monday, Wednesday, and Friday. Subsequently she seems to be doing fairly well with the Wound VAC. Her son who was present during evaluation today states that he somewhat stresses of the Wound VAC in making sure that it was functioning appropriately. With that being said everything seems to be working well he knows what settings on the [facility] itself to look at and ensure that it is functioning properly. Overall the wound appears to be nice and clean there is no need for debridement today. She has no discomfort in her bilateral lower extremity ulcers also appear to be improving based on measurements and what this honest tell me about the overall appearance. 07/02/17 on evaluation today I noted in patients wound bed that was actually an odor that had not previously been noted. Subsequently there was also a small area of bone that was not previously noted during my last evaluation. This did appear to be necrotic and was being somewhat forced out by the body around the region of granulation. She does not have any pain which is good news. Nonetheless the overall appearance of the ulcer is making me concerned for the patient having developed osteomyelitis. Currently she is not been on any antibiotics and the Wound VAC has been doing fairly well in general. With that being said I do not think we need to continue the Wound VAC if she potentially has a bone infection. At least not until it is properly addressed with antibiotics. Jessica [name], Jessica [name] ([id number]) 07/09/17 on evaluation today patient appears to actually be doing very well in regard to her sacral ulcer compared to last week. There is actually no exposed bone at this point. Her pathology report showed early  signs of osteomyelitis which had explained to the patient son is definitely good news catching this early is often a key to getting it better without things worsening. With that being said still I do believe she needs to have a referral to infectious disease due to the osteomyelitis I am gonna recommend that she  continue with the doxycycline based on the culture results which showed Eikenella Corrodens as the organism identified that tetracycline should work for. She does not seem to be having any discomfort whatsoever at this point. 07/16/17 on evaluation today patient actually appears to be doing rather well in regard to her sacral wound. I think that the original wound site actually appears much better than previously noted. With that being said she does have a new superficial injury on the right sacral region which appears to be due to according to the sign transport that occurred for her MRI unfortunately. Fortunately this is not too deep and I do think it can be managed but it was a new area that was not previously noted. Otherwise she has been tolerating the [place] s soaked gauze packing without complication. 07/30/17 on evaluation today patient presents for reevaluation concerning her sacral wound. She has been tolerating the dressing changes without complication. With that being said her wound is doing so well at this point that I think she may be at the point where we could reinitiate the Wound VAC currently and hopefully see good results from this. I'm very pleased with how she has responded to the [facility] s soaked gauze packing. 08/13/17 evaluation today patient appears to be doing excellent in regard to her lower extremity ulcer this seems to be cleaning up very nicely. In regard to the sacral ulcer the area of trauma on the right lateral portion of the wound actually appears to be much better than previously noted during the last evaluation. The word has definitely filled in and the Wound VAC  appears to be helpful I do believe. Overall I'm very pleased with how things seem to be progressing at this time. Patient likewise is also happy that things are doing well. Patient History Information obtained from Patient. Family History Hypertension - Father, Lung Disease - Father, No family history of Cancer, Diabetes, Heart Disease, Kidney Disease, Seizures, Stroke, Thyroid Problems, Tuberculosis. Social History Never smoker, Marital Status - Married, Alcohol Use - Never, Drug Use - No History, Caffeine Use - Daily. Medical History Hospitalization/Surgery History - 01/15/2017, [facility], UTI and yeats infection with pneumonia. Review of Systems (ROS) Constitutional Symptoms (General Health) Denies complaints or symptoms of Fever, Chills. Respiratory The patient has no complaints or symptoms. Cardiovascular The patient has no complaints or symptoms. Psychiatric The patient has no complaints or symptoms. Objective Jessica [name], Jessica [name] ([id number]) Constitutional Well-nourished and well-hydrated in no acute distress. Vitals Time Taken: 10:59 AM, Height: 70 in, Temperature: 98.6 F, Pulse: 81 bpm, Respiratory Rate: 16 breaths/min, Blood Pressure: 134/60 mmHg. Respiratory normal breathing without difficulty. Psychiatric this patient is able to make decisions and demonstrates good insight into disease process. Alert and Oriented x 3. pleasant and cooperative. General Notes: Patients world in regard to her sacrum appears to show signs of good granulation and filling in overall I am happy in that regard. With that being said her lower extremity ulcer on the left seems to be doing well with the sandal and I think we will continue with this currently. Integumentary (Hair, Skin) Wound #1 status is Open. Original cause of wound was Pressure Injury. The wound is located on the Medial Sacrum. The wound measures 8.5cm length x 4cm width x 1.8cm depth; 26.704cm^2 area and 48.066cm^3 volume. There  is bone, muscle, and Fat Layer (Subcutaneous Tissue) Exposed exposed. There is undermining starting at 6:00 and ending at 12:00 with a maximum distance of 2.4cm. There is a large amount  of serous drainage noted. Foul odor after cleansing was noted. The wound margin is distinct with the outline attached to the wound base. There is large (67-100%) red, hyper - granulation within the wound bed. There is a small (1-33%) amount of necrotic tissue within the wound bed including Eschar and Adherent Slough. The periwound skin appearance exhibited: Induration, Scarring, Erythema. The periwound skin appearance did not exhibit: Callus, Crepitus, Excoriation, Rash, Dry/Scaly, Maceration, Atrophie Blanche, Cyanosis, Ecchymosis, Hemosiderin Staining, Mottled, Pallor, Rubor. The surrounding wound skin color is noted with erythema which is circumferential. Periwound temperature was noted as No Abnormality. The periwound has tenderness on palpation. Wound #2 status is Open. Original cause of wound was Gradually Appeared. The wound is located on the Left Lower Leg. The wound measures 6.2cm length x 1.2cm width x 0.2cm depth; 5.843cm^2 area and 1.169cm^3 volume. Wound #3 status is Healed - Epithelialized. Original cause of wound was Gradually Appeared. The wound is located on the Right,Lateral Lower Leg. The wound measures 0cm length x 0cm width x 0cm depth; 0cm^2 area and 0cm^3 volume. 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 ([id number]) Procedures Wound #1 Pre-procedure diagnosis of Wound #1 is a Pressure Ulcer located on the Medial Sacrum . There was a Excisional Skin/Subcutaneous Tissue Debridement with a total area of 3 sq cm performed by STONE III,  HOYT E., PA-C. With the following instrument(s): Curette. to remove Viable and Non-Viable tissue/material Material removed includes Subcutaneous Tissue, and Slough, Fibrin/Exudate, and [place] after achieving pain control using Lidocaine 4% Topical Solution. No specimens were taken. A time out was conducted at 11:58, prior to the start of the procedure. A Minimum amount of bleeding was controlled with Pressure. The procedure was tolerated well with a pain level of 0 throughout and a pain level of 0 following the procedure. Post Debridement Measurements: 8.5cm length x 4cm width x 1.9cm depth; 50.737cm^3 volume. Post debridement Stage noted as Category/Stage IV. Character of Wound/Ulcer Post Debridement is improved. Post procedure Diagnosis Wound #1: Same as Pre-Procedure Plan Wound Cleansing: Wound #1 Medial Sacrum: Clean wound with Normal Saline. Wound #2 Left Lower Leg: Clean wound with Normal Saline. Anesthetic (add to Medication List): Wound #1 Medial Sacrum: Topical Lidocaine 4% cream applied to wound bed prior to debridement (In Clinic Only). Wound #2 Left Lower Leg: Topical Lidocaine 4% cream applied to wound bed prior to debridement (In Clinic Only). Primary Wound Dressing: Wound #2 Left Lower Leg: Santyl Ointment - PLACE ON OPEN WOUND AREAS ON LEFT LATERAL LOWER LEG Silver Alginate - over santyl Secondary Dressing: Wound #2 Left Lower Leg: Boardered Foam Dressing - or ABD pad and tape Dressing Change Frequency: Wound #1 Medial Sacrum: Change Dressing Monday, Wednesday, Friday Wound #2 Left Lower Leg: Change dressing every day. Follow-up Appointments: Wound #1 Medial Sacrum: Return Appointment in 2 weeks. Wound #2 Left Lower Leg: Return Appointment in 2 weeks. Off-Loading: Wound #1 Medial Sacrum: Turn and reposition every 2 hours Wound #2 Left Lower Leg: Turn and reposition every 2 hours Additional Orders / Instructions: Wound #1 Medial Sacrum: Increase protein  intake. Wound #2 Left Lower Leg: Jessica [name], Jessica [name]. ([id number]) Increase protein intake. Home Health: Wound #1 Medial Sacrum: Continue Home Health Visits - [facility] Nurse may visit PRN to address patient s wound  care needs. FACE TO FACE ENCOUNTER: MEDICARE and MEDICAID PATIENTS: I certify that this patient is under my care and that I had a face-to-face encounter that meets the physician face-to-face encounter requirements with this patient on this date. The encounter with the patient was in whole or in part for the following MEDICAL CONDITION: (primary reason for Home Healthcare) MEDICAL NECESSITY: I certify, that based on my findings, NURSING services are a medically necessary home health service. HOME BOUND STATUS: I certify that my clinical findings support that this patient is homebound (i.e., Due to illness or injury, pt requires aid of supportive devices such as crutches, cane, wheelchairs, walkers, the use of special transportation or the assistance of another person to leave their place of residence. There is a normal inability to leave the home and doing so requires considerable and taxing effort. Other absences are for medical reasons / religious services and are infrequent or of short duration when for other reasons). If current dressing causes regression in wound condition, may D/C ordered dressing product/s and apply Normal Saline Moist Dressing daily until next Wound Healing Center / Other MD appointment. Notify Wound Healing Center of regression in wound condition at [phone]. Please direct any NON-WOUND related issues/requests for orders to patient's Primary Care Physician Wound #2 Left Lower Leg: Continue Home Health Visits - [facility] Nurse may visit PRN to address patient s wound care needs. FACE TO FACE ENCOUNTER: MEDICARE and MEDICAID PATIENTS: I certify that this patient is under my care and that I had a face-to-face encounter that meets the  physician face-to-face encounter requirements with this patient on this date. The encounter with the patient was in whole or in part for the following MEDICAL CONDITION: (primary reason for Home Healthcare) MEDICAL NECESSITY: I certify, that based on my findings, NURSING services are a medically necessary home health service. HOME BOUND STATUS: I certify that my clinical findings support that this patient is homebound (i.e., Due to illness or injury, pt requires aid of supportive devices such as crutches, cane, wheelchairs, walkers, the use of special transportation or the assistance of another person to leave their place of residence. There is a normal inability to leave the home and doing so requires considerable and taxing effort. Other absences are for medical reasons / religious services and are infrequent or of short duration when for other reasons). If current dressing causes regression in wound condition, may D/C ordered dressing product/s and apply Normal Saline Moist Dressing daily until next Wound Healing Center / Other MD appointment. Notify Wound Healing Center of regression in wound condition at [phone]. Please direct any NON-WOUND related issues/requests for orders to patient's Primary Care Physician Negative Pressure Wound Therapy: Wound #1 Medial Sacrum: Wound VAC settings at 125/130 mmHg continuous pressure. Use BLACK/GREEN foam to wound cavity. Use WHITE foam to fill any tunnel/s and/or undermining. Change VAC dressing 3 X WEEK. Change canister as indicated when full. Nurse may titrate settings and frequency of dressing changes as clinically indicated. I am going to recommend currently that we continue with the Current wound care measures we will discontinue the alternate for the surrounding ulcer as this has pretty much all combined into one section as far as the sacral word is concerned I think the back can be used on the whole area. I did read away some of the necrotic  tissue where the patient had previously had the injury a couple weeks back where she had been slid improperly when transferring for her test.  Fortunately this seems to be doing much better. We will see how she is doing in two weeks time when I see her for reevaluation. Please see above for specific wound care orders. We will see patient for re-evaluation in 2 week(s) here in the clinic. If anything worsens or changes patient will contact our office for additional recommendations. Electronic Signature(s) Signed: 08/17/2017 5:56:33 PM By: Lenda Kelp PA-C Entered By: Lenda Kelp on 08/13/2017 13:10:22 Jessica [name] ([id number]) -------------------------------------------------------------------------------- ROS/PFSH Details [name]. Date of Service: 08/13/2017 11:15 AM Medical Record Number: [id number] Patient Account Number: [id number] Date of Birth/Sex: 02/13/1946 (72 y.o. F) Treating RN: Curtis Sites Primary Care Provider: Annita Brod Other Clinician: Referring Provider: Annita Brod Treating Provider/Extender: STONE III, HOYT Weeks in Treatment: 11 Information Obtained From Patient Wound History Do you currently have one or more open woundso Yes How many open wounds do you currently haveo 3 How have you been treating your wound(s) until nowo 3 months Has your wound(s) ever healed and then re-openedo No Have you had any lab work done in the past montho No Have you tested positive for an antibiotic resistant organism (MRSA, VRE)o No Constitutional Symptoms (General Health) Complaints and Symptoms: Negative for: Fever; Chills Eyes Medical History: Negative for: Cataracts; Glaucoma; Optic Neuritis Hematologic/Lymphatic Medical History: Positive for: Anemia; Lymphedema Negative for: Hemophilia; Human Immunodeficiency Virus; Sickle Cell Disease Respiratory Complaints and Symptoms: No Complaints or Symptoms Medical History: Negative for:  Aspiration; Asthma; Chronic Obstructive Pulmonary Disease (COPD); Pneumothorax; Sleep Apnea; Tuberculosis Cardiovascular Complaints and Symptoms: No Complaints or Symptoms Medical History: Positive for: Deep Vein Thrombosis Negative for: Angina; Arrhythmia; Congestive Heart Failure; Coronary Artery Disease; Hypertension; Hypotension; Myocardial Infarction Gastrointestinal Medical History: Negative for: Cirrhosis ; Colitis; Crohnos; Hepatitis A; Hepatitis B; Hepatitis C Jessica [name], Jessica [name]. ([id number]) Endocrine Medical History: Negative for: Type I Diabetes; Type II Diabetes Genitourinary Medical History: Negative for: End Stage Renal Disease Immunological Medical History: Negative for: Lupus Erythematosus; Raynaudos; Scleroderma Integumentary (Skin) Medical History: Negative for: History of Burn; History of pressure wounds Musculoskeletal Medical History: Negative for: Gout; Rheumatoid Arthritis; Osteoarthritis; Osteomyelitis Neurologic Medical History: Positive for: Quadriplegia Negative for: Dementia; Neuropathy; Paraplegia; Seizure Disorder Psychiatric Complaints and Symptoms: No Complaints or Symptoms Medical History: Negative for: Anorexia/bulimia; Confinement Anxiety Immunizations Pneumococcal Vaccine: Received Pneumococcal Vaccination: Yes Implantable Devices Hospitalization / Surgery History Name of Hospital Purpose of Hospitalization/Surgery Date [facility] UTI and yeats infection with pneumonia 01/15/2017 Family and Social History Cancer: No; Diabetes: No; Heart Disease: No; Hypertension: Yes - Father; Kidney Disease: No; Lung Disease: Yes - Father; Seizures: No; Stroke: No; Thyroid Problems: No; Tuberculosis: No; Never smoker; Marital Status - Married; Alcohol Use: Never; Drug Use: No History; Caffeine Use: Daily; Financial Concerns: No; Food, Clothing or Shelter Needs: No; Support System Lacking: No; Advanced Directives: No; Patient does not want information on  Advanced Directives; Do not resuscitate: No; Living Will: No; Medical Power of Attorney: No Physician Affirmation I have reviewed and agree with the above information. Jessica [name], Jessica [name] ([id number]) Electronic Signature(s) Signed: 08/13/2017 4:48:08 PM By: Curtis Sites Signed: 08/17/2017 5:56:33 PM By: Lenda Kelp PA-C Entered By: Lenda Kelp on 08/13/2017 13:09:05 Jessica [name] ([id number]) -------------------------------------------------------------------------------- SuperBill Details [name]. Date of Service: 08/13/2017 Medical Record Number: [id number] Patient Account Number: [id number] Date of Birth/Sex: 02-Mar-1946 (72 y.o. F) Treating RN: Curtis Sites Primary Care Provider: Annita Brod Other Clinician: Referring Provider: Annita Brod Treating Provider/Extender: STONE III, HOYT Weeks in Treatment:  11 Diagnosis Coding ICD-10 Codes Code Description L89.154 Pressure ulcer of sacral region, stage 4 M48.00 Spinal stenosis, site unspecified R54 Age-related physical debility G89.4 Chronic pain syndrome E78.2 Mixed hyperlipidemia L89.95 Pressure ulcer of unspecified site, unstageable Facility Procedures CPT4 Code: [id number] Description: 11042 - DEB SUBQ TISSUE 20 SQ CM/< ICD-10 Diagnosis Description L89.154 Pressure ulcer of sacral region, stage 4 Modifier: Quantity: 1 Physician Procedures CPT4 Code: [id number] Description: 11042 - WC PHYS SUBQ TISS 20 SQ CM ICD-10 Diagnosis Description L89.154 Pressure ulcer of sacral region, stage 4 Modifier: Quantity: 1 Electronic Signature(s) Signed: 08/17/2017 5:56:33 PM By: Lenda Kelp PA-C Entered By: Lenda Kelp on 08/13/2017 13:10:42

## 2017-08-19 NOTE — Progress Notes (Signed)
DANAIJA, ESKRIDGE ([id number]) Visit Report for 08/13/2017 Arrival Information Details [name], Jessica [name]. Date of Service: 08/13/2017 11:15 AM Medical Record Number: [id number] Patient Account Number: [id number] Date of Birth/Sex: 1945-12-03 (72 y.o. F) Treating RN: Huel Coventry Primary Care Gloriana Piltz: Annita Brod Other Clinician: Referring Analissa Bayless: Annita Brod Treating Orlondo Holycross/Extender: Linwood Dibbles, HOYT Weeks in Treatment: 11 Visit Information History Since Last Visit Added or deleted any medications: No Patient Arrived: Stretcher Any new allergies or adverse reactions: No Arrival Time: 10:59 Had a fall or experienced change in No Accompanied By: son activities of daily living that may affect Transfer Assistance: Stretcher risk of falls: Patient Identification Verified: Yes Signs or symptoms of abuse/neglect since last visito No Secondary Verification Process Completed: Yes Hospitalized since last visit: No Patient Has Alerts: Yes Implantable device outside of the clinic excluding No Patient Alerts: allergic to zinc cellular tissue based products placed in the center since last visit: Has Dressing in Place as Prescribed: Yes Pain Present Now: No Electronic Signature(s) Signed: 08/13/2017 5:40:02 PM By: Elliot Gurney, BSN, RN, CWS, Kim RN, BSN Entered By: Elliot Gurney, BSN, RN, CWS, Kim on 08/13/2017 10:59:40 Jessica [name] ([id number]) -------------------------------------------------------------------------------- Encounter Discharge Information Details [name]. Date of Service: 08/13/2017 11:15 AM Medical Record Number: [id number] Patient Account Number: [id number] Date of Birth/Sex: [birth year] (72 y.o. F) Treating RN: Curtis Sites Primary Care Graceanna Theissen: Annita Brod Other Clinician: Referring Nashly Olsson: Annita Brod Treating Steffen Hase/Extender: Linwood Dibbles, HOYT Weeks in Treatment: 11 Encounter Discharge Information Items Discharge Pain  Level: 0 Discharge Condition: Stable Ambulatory Status: Stretcher Discharge Destination: Home Transportation: Ambulance Accompanied By: son Schedule Follow-up Appointment: Yes Medication Reconciliation completed and No provided to Patient/Care Uriel Dowding: Provided on Clinical Summary of Care: 08/13/2017 Form Type Recipient Paper Patient PO Electronic Signature(s) Signed: 08/13/2017 1:52:30 PM By: Alejandro Mulling Entered By: Alejandro Mulling on 08/13/2017 13:52:29 Jessica [name] ([id number]) -------------------------------------------------------------------------------- Lower Extremity Assessment Details [name]. Date of Service: 08/13/2017 11:15 AM Medical Record Number: [id number] Patient Account Number: [id number] Date of Birth/Sex: Jun 21, 1945 (72 y.o. F) Treating RN: Huel Coventry Primary Care Fuquan Wilson: Annita Brod Other Clinician: Referring Jamar Weatherall: Annita Brod Treating Farah Lepak/Extender: STONE III, HOYT Weeks in Treatment: 11 Edema Assessment Assessed: [Left: No] [Right: No] Edema: [Left: No] [Right: No] Vascular Assessment Pulses: Dorsalis Pedis Palpable: [Left:Yes] [Right:Yes] Posterior Tibial Extremity colors, hair growth, and conditions: Extremity Color: [Left:Normal] [Right:Normal] Hair Growth on Extremity: [Left:Yes] [Right:Yes] Temperature of Extremity: [Left:Warm] [Right:Warm] Capillary Refill: [Left:< 3 seconds] [Right:< 3 seconds] Toe Nail Assessment Left: Right: Thick: Yes Yes Discolored: Yes Yes Deformed: Yes Yes Improper Length and Hygiene: Yes Yes Electronic Signature(s) Signed: 08/13/2017 5:40:02 PM By: Elliot Gurney, BSN, RN, CWS, Kim RN, BSN Entered By: Elliot Gurney, BSN, RN, CWS, Kim on 08/13/2017 11:16:33 Jessica [name] ([id number]) -------------------------------------------------------------------------------- Multi Wound Chart Details [name]. Date of Service: 08/13/2017 11:15 AM Medical Record  Number: [id number] Patient Account Number: [id number] Date of Birth/Sex: 1945/07/05 (72 y.o. F) Treating RN: Curtis Sites Primary Care Desmon Hitchner: Annita Brod Other Clinician: Referring Keone Kamer: Annita Brod Treating Virginia Francisco/Extender: STONE III, HOYT Weeks in Treatment: 11 Vital Signs Height(in): 70 Pulse(bpm): 81 Weight(lbs): Blood Pressure(mmHg): 134/60 Body Mass Index(BMI): Temperature(F): 98.6 Respiratory Rate 16 (breaths/min): Photos: [1:No Photos] [2:No Photos] [3:No Photos] Wound Location: [1:Sacrum - Medial] [2:Left Lower Leg] [3:Right, Lateral Lower Leg] Wounding Event: [1:Pressure Injury] [2:Gradually Appeared] [3:Gradually Appeared] Primary Etiology: [1:Pressure Ulcer] [2:Pressure Ulcer] [3:Pressure Ulcer] Comorbid History: [1:Anemia, Lymphedema, Deep N/A Vein Thrombosis, Quadriplegia] [3:N/A] Date Acquired: [  1:01/15/2017] [2:04/28/2017] [3:04/28/2017] Weeks of Treatment: [1:11] [2:11] [3:11] Wound Status: [1:Open] [2:Open] [3:Healed - Epithelialized] Measurements L x W x D [1:8.5x4x1.8] [2:6.2x1.2x0.2] [3:0x0x0] (cm) Area (cm) : [1:26.704] [2:5.843] [3:0] Volume (cm) : [1:48.066] [2:1.169] [3:0] % Reduction in Area: [1:27.50%] [2:-1140.60%] [3:100.00%] % Reduction in Volume: [1:-30.40%] [2:-1143.60%] [3:100.00%] Starting Position 1 [1:6] (o'clock): Ending Position 1 [1:12] (o'clock): Maximum Distance 1 (cm): [1:2.4] Undermining: [1:Yes] [2:N/A] [3:N/A] Classification: [1:Category/Stage IV] [2:Category/Stage III] [3:Category/Stage II] Exudate Amount: [1:Large] [2:N/A] [3:N/A] Exudate Type: [1:Serous] [2:N/A] [3:N/A] Exudate Color: [1:amber] [2:N/A] [3:N/A] Foul Odor After Cleansing: [1:Yes] [2:N/A] [3:N/A] Odor Anticipated Due to [1:No] [2:N/A] [3:N/A] Product Use: Wound Margin: [1:Distinct, outline attached] [2:N/A] [3:N/A] Granulation Amount: [1:Large (67-100%)] [2:N/A] [3:N/A] Granulation Quality: [1:Red, Hyper-granulation] [2:N/A] [3:N/A] Necrotic  Amount: [1:Small (1-33%)] [2:N/A] [3:N/A] Necrotic Tissue: [1:Eschar, Adherent Slough] [2:N/A] [3:N/A] Exposed Structures: [1:Fat Layer (Subcutaneous Tissue) Exposed: Yes Muscle: Yes Bone: Yes] [2:N/A] [3:N/A] Fascia: No Tendon: No Joint: No Epithelialization: None N/A N/A Periwound Skin Texture: Induration: Yes No Abnormalities Noted No Abnormalities Noted Scarring: Yes Excoriation: No Callus: No Crepitus: No Rash: No Periwound Skin Moisture: Maceration: No No Abnormalities Noted No Abnormalities Noted Dry/Scaly: No Periwound Skin Color: Erythema: Yes No Abnormalities Noted No Abnormalities Noted Atrophie Blanche: No Cyanosis: No Ecchymosis: No Hemosiderin Staining: No Mottled: No Pallor: No Rubor: No Erythema Location: Circumferential N/A N/A Temperature: No Abnormality N/A N/A Tenderness on Palpation: Yes No No Wound Preparation: Ulcer Cleansing: N/A N/A Rinsed/Irrigated with Saline Topical Anesthetic Applied: Other: lidocaine 4% Treatment Notes Electronic Signature(s) Signed: 08/13/2017 4:48:08 PM By: Curtis Sites Entered By: Curtis Sites on 08/13/2017 11:56:06 Jessica [name] ([id number]) -------------------------------------------------------------------------------- Multi-Disciplinary Care Plan Details [name]. Date of Service: 08/13/2017 11:15 AM Medical Record Number: [id number] Patient Account Number: [id number] Date of Birth/Sex: March 22, 1946 (72 y.o. F) Treating RN: Curtis Sites Primary Care [name] [name]: Annita Brod Other Clinician: Referring Kaine Mcquillen: Annita Brod Treating Jennavie Martinek/Extender: STONE III, HOYT Weeks in Treatment: 11 Active Inactive ` Orientation to the Wound Care Program Nursing Diagnoses: Knowledge deficit related to the wound healing center program Goals: Patient/caregiver will verbalize understanding of the Wound Healing Center Program Date Initiated: 06/03/2017 Target Resolution Date: 06/17/2017 Goal  Status: Active Interventions: Provide education on orientation to the wound center Notes: ` Pressure Nursing Diagnoses: Knowledge deficit related to causes and risk factors for pressure ulcer development Knowledge deficit related to management of pressures ulcers Potential for impaired tissue integrity related to pressure, friction, moisture, and shear Goals: Patient will remain free from development of additional pressure ulcers Date Initiated: 06/03/2017 Target Resolution Date: 06/17/2017 Goal Status: Active Patient will remain free of pressure ulcers Date Initiated: 06/03/2017 Target Resolution Date: 06/17/2017 Goal Status: Active Patient/caregiver will verbalize risk factors for pressure ulcer development Date Initiated: 06/03/2017 Target Resolution Date: 06/17/2017 Goal Status: Active Interventions: Assess: immobility, friction, shearing, incontinence upon admission and as needed Assess potential for pressure ulcer upon admission and as needed Notes: ` Wound/Skin Impairment ANALEIGH, ARIES ([id number]) Nursing Diagnoses: Impaired tissue integrity Goals: Patient/caregiver will verbalize understanding of skin care regimen Date Initiated: 06/03/2017 Target Resolution Date: 06/17/2017 Goal Status: Active Ulcer/skin breakdown will have a volume reduction of 30% by week 4 Date Initiated: 06/03/2017 Target Resolution Date: 06/17/2017 Goal Status: Active Interventions: Assess ulceration(s) every visit Treatment Activities: Patient referred to home care : 06/03/2017 Skin care regimen initiated : 06/03/2017 Notes: Electronic Signature(s) Signed: 08/13/2017 4:48:08 PM By: Curtis Sites Entered By: Curtis Sites on 08/13/2017 11:55:49 Jessica [name] ([id number]) -------------------------------------------------------------------------------- Pain Assessment Details  [name], Jessica [name]. Date of Service: 08/13/2017 11:15 AM Medical Record Number: [id number] Patient  Account Number: [id number] Date of Birth/Sex: Aug 21, 1945 (72 y.o. F) Treating RN: Huel Coventry Primary Care Mahayla Haddaway: Annita Brod Other Clinician: Referring Kennidee Heyne: Annita Brod Treating Walter Min/Extender: Linwood Dibbles, HOYT Weeks in Treatment: 11 Active Problems Location of Pain Severity and Description of Pain Patient Has Paino No Site Locations Pain Management and Medication Current Pain Management: Electronic Signature(s) Signed: 08/13/2017 5:40:02 PM By: Elliot Gurney, BSN, RN, CWS, Kim RN, BSN Entered By: Elliot Gurney, BSN, RN, CWS, Kim on 08/13/2017 10:59:54 Jessica [name] ([id number]) -------------------------------------------------------------------------------- Patient/Caregiver Education Details [name]. Date of Service: 08/13/2017 11:15 AM Medical Record Number: [id number] Patient Account Number: [id number] Date of Birth/Gender: [birth year] (72 y.o. F) Treating RN: Phillis Haggis Primary Care Physician: Annita Brod Other Clinician: Referring Physician: Annita Brod Treating Physician/Extender: Skeet Simmer in Treatment: 11 Education Assessment Education Provided To: Patient Education Topics Provided Wound/Skin Impairment: Handouts: Caring for Your Ulcer, Other: change dressing as ordered Methods: Demonstration, Explain/Verbal Responses: State content correctly Electronic Signature(s) Signed: 08/13/2017 4:40:49 PM By: Alejandro Mulling Entered By: Alejandro Mulling on 08/13/2017 13:52:49 Jessica [name] ([id number]) -------------------------------------------------------------------------------- Wound Assessment Details [name]. Date of Service: 08/13/2017 11:15 AM Medical Record Number: [id number] Patient Account Number: [id number] Date of Birth/Sex: 12-01-1945 (72 y.o. F) Treating RN: Huel Coventry Primary Care Aiyanah Kalama: Annita Brod Other Clinician: Referring Loui Massenburg: Annita Brod Treating Danzell Birky/Extender:  STONE III, HOYT Weeks in Treatment: 11 Wound Status Wound Number: 1 Primary Pressure Ulcer Etiology: Wound Location: Sacrum - Medial Wound Status: Open Wounding Event: Pressure Injury Comorbid Anemia, Lymphedema, Deep Vein Date Acquired: 01/15/2017 History: Thrombosis, Quadriplegia Weeks Of Treatment: 11 Clustered Wound: No Photos Photo Uploaded By: Elliot Gurney, BSN, RN, CWS, Kim on 08/13/2017 16:14:40 Wound Measurements Length: (cm) 8.5 Width: (cm) 4 Depth: (cm) 1.8 Area: (cm) 26.704 Volume: (cm) 48.066 % Reduction in Area: 27.5% % Reduction in Volume: -30.4% Epithelialization: None Undermining: Yes Starting Position (o'clock): 6 Ending Position (o'clock): 12 Maximum Distance: (cm) 2.4 Wound Description Classification: Category/Stage IV Wound Margin: Distinct, outline attached Exudate Amount: Large Exudate Type: Serous Exudate Color: amber Foul Odor After Cleansing: Yes Due to Product Use: No Slough/Fibrino Yes Wound Bed Granulation Amount: Large (67-100%) Exposed Structure Granulation Quality: Red, Hyper-granulation Fascia Exposed: No Necrotic Amount: Small (1-33%) Fat Layer (Subcutaneous Tissue) Exposed: Yes Necrotic Quality: Eschar, Adherent Slough Tendon Exposed: No Muscle Exposed: Yes Necrosis of Muscle: No Jessica [name], Jessica [name]. ([id number]) Joint Exposed: No Bone Exposed: Yes Periwound Skin Texture Texture Color No Abnormalities Noted: No No Abnormalities Noted: No Callus: No Atrophie Blanche: No Crepitus: No Cyanosis: No Excoriation: No Ecchymosis: No Induration: Yes Erythema: Yes Rash: No Erythema Location: Circumferential Scarring: Yes Hemosiderin Staining: No Mottled: No Moisture Pallor: No No Abnormalities Noted: No Rubor: No Dry / Scaly: No Maceration: No Temperature / Pain Temperature: No Abnormality Tenderness on Palpation: Yes Wound Preparation Ulcer Cleansing: Rinsed/Irrigated with Saline Topical Anesthetic Applied: Other:  lidocaine 4%, Treatment Notes Wound #1 (Medial Sacrum) 1. Cleansed with: Clean wound with Normal Saline 2. Anesthetic Topical Lidocaine 4% cream to wound bed prior to debridement 3. Peri-wound Care: Skin Prep 4. Dressing Applied: Other dressing (specify in notes) 8. Negative Pressure Wound Therapy Wound Vac to wound continuously at 165mm/hg pressure Black Foam Number of foam/gauze pieces used in the dressing = Change canister as needed. Notes 3 pieces of black foam Electronic Signature(s) Signed: 08/13/2017 5:40:02 PM By: Elliot Gurney, BSN, RN, CWS, Kim  RN, BSN Entered By: Elliot Gurney, BSN, RN, CWS, Kim on 08/13/2017 11:08:48 Jessica [name] ([id number]) -------------------------------------------------------------------------------- Wound Assessment Details [name]. Date of Service: 08/13/2017 11:15 AM Medical Record Number: [id number] Patient Account Number: [id number] Date of Birth/Sex: 01-Dec-1945 (72 y.o. F) Treating RN: Huel Coventry Primary Care Edessa Jakubowicz: Annita Brod Other Clinician: Referring Cala Kruckenberg: Annita Brod Treating Oluwadarasimi Favor/Extender: STONE III, HOYT Weeks in Treatment: 11 Wound Status Wound Number: 2 Primary Etiology: Pressure Ulcer Wound Location: Left Lower Leg Wound Status: Open Wounding Event: Gradually Appeared Date Acquired: 04/28/2017 Weeks Of Treatment: 11 Clustered Wound: No Photos Photo Uploaded By: Elliot Gurney, BSN, RN, CWS, Kim on 08/13/2017 16:15:04 Wound Measurements Length: (cm) 6.2 Width: (cm) 1.2 Depth: (cm) 0.2 Area: (cm) 5.843 Volume: (cm) 1.169 % Reduction in Area: -1140.6% % Reduction in Volume: -1143.6% Wound Description Classification: Category/Stage III Periwound Skin Texture Texture Color No Abnormalities Noted: No No Abnormalities Noted: No Moisture No Abnormalities Noted: No Treatment Notes Wound #2 (Left Lower Leg) 1. Cleansed with: Clean wound with Normal Saline 2. Anesthetic Topical Lidocaine 4%  cream to wound bed prior to debridement 4. Dressing Applied: Jessica [name], Jessica [name] ([id number]) Santyl Ointment 5. Secondary Dressing Applied ABD Pad 7. Secured with Secretary/administrator) Signed: 08/13/2017 5:40:02 PM By: Elliot Gurney, BSN, RN, CWS, Kim RN, BSN Entered By: Elliot Gurney, BSN, RN, CWS, Kim on 08/13/2017 11:12:39 Jessica [name] ([id number]) -------------------------------------------------------------------------------- Wound Assessment Details [name]. Date of Service: 08/13/2017 11:15 AM Medical Record Number: [id number] Patient Account Number: [id number] Date of Birth/Sex: 1945-04-29 (72 y.o. F) Treating RN: Huel Coventry Primary Care Romaine Maciolek: Annita Brod Other Clinician: Referring Icesis Renn: Annita Brod Treating Imya Mance/Extender: STONE III, HOYT Weeks in Treatment: 11 Wound Status Wound Number: 3 Primary Etiology: Pressure Ulcer Wound Location: Right, Lateral Lower Leg Wound Status: Healed - Epithelialized Wounding Event: Gradually Appeared Date Acquired: 04/28/2017 Weeks Of Treatment: 11 Clustered Wound: No Photos Photo Uploaded By: Elliot Gurney, BSN, RN, CWS, Kim on 08/13/2017 16:15:26 Wound Measurements Length: (cm) 0 Width: (cm) 0 Depth: (cm) 0 Area: (cm) 0 Volume: (cm) 0 % Reduction in Area: 100% % Reduction in Volume: 100% Wound Description Classification: Category/Stage II Periwound Skin Texture Texture Color No Abnormalities Noted: No No Abnormalities Noted: No Moisture No Abnormalities Noted: No Electronic Signature(s) Signed: 08/13/2017 5:40:02 PM By: Elliot Gurney, BSN, RN, CWS, Kim RN, BSN Entered By: Elliot Gurney, BSN, RN, CWS, Kim on 08/13/2017 11:11:48 Jessica [name] ([id number]) -------------------------------------------------------------------------------- Vitals Details [name]. Date of Service: 08/13/2017 11:15 AM Medical Record Number: [id number] Patient Account Number: [id number] Date of  Birth/Sex: 05/27/1945 (72 y.o. F) Treating RN: Huel Coventry Primary Care Yvaine Jankowiak: Annita Brod Other Clinician: Referring Haleigh Desmith: Annita Brod Treating Atalia Litzinger/Extender: STONE III, HOYT Weeks in Treatment: 11 Vital Signs Time Taken: 10:59 Temperature (F): 98.6 Height (in): 70 Pulse (bpm): 81 Respiratory Rate (breaths/min): 16 Blood Pressure (mmHg): 134/60 Reference Range: 80 - 120 mg / dl Electronic Signature(s) Signed: 08/13/2017 5:40:02 PM By: Elliot Gurney, BSN, RN, CWS, Kim RN, BSN Entered By: Elliot Gurney, BSN, RN, CWS, Kim on 08/13/2017 11:00:12

## 2017-08-24 ENCOUNTER — Other Ambulatory Visit: Payer: Self-pay

## 2017-08-24 ENCOUNTER — Telehealth: Payer: Self-pay | Admitting: Radiology

## 2017-08-24 NOTE — Telephone Encounter (Signed)
Pt has been rescheduled for Interventional Radiology consult multiple times by pt's son stating that pt has pressure wound & requires EMS transportation. Facilitated getting sooner appointment with Interventional Radiology. Pt will return to clinic following this appointment. Son is in agreement.

## 2017-08-24 NOTE — Telephone Encounter (Signed)
-----   Message from Vanna Scotland, MD sent at 08/23/2017  2:05 PM EDT ----- Regarding: RE: IR referral Left hold off on seeing her until she sees [place] [facility].  Vanna Scotland, MD  ----- Message ----- From: Nicolasa Ducking, RN Sent: 08/23/2017  11:27 AM To: Vanna Scotland, MD Subject: RE: IR referral                                The family has rescheduled the appt with [facility] several times. It is now scheduled for 09/29/2017 but she has an appt with you on 6/18.  Do you want to keep this appt as scheduled or have her return after she sees [facility]? ----- Message ----- From: Vanna Scotland, MD Sent: 07/12/2017   1:31 PM To: Doneisha Ivey Georgie Chard, RN Subject: IR referral                                    I put in a referral in December for interventional radiology for this patient.  I recently got a bounce back message for this order.  I should have CC: you the note way back when for you to arrange rather than putting in the referral order.  Work flow issue (ie why is there a referral order when this is not the way to do it??!!)  Can you arrange this?  Vanna Scotland, MD

## 2017-08-24 NOTE — Telephone Encounter (Signed)
Notified son that follow up appt with Dr Apolinar Junes needs to be after pt is seen by Interventional Radiologist. Reiterated to son Dr Delana Meyer concern for treating renal mass while it is small. Son voices understanding but states he was told by [facility] that pt can not be seen until after pressure wound has healed.  Spoke with Vickie at [facility] who states she will call son to explain that this was a misunderstanding & pt may be seen prior to healing of wound & also may be transported to their facility by EMS.

## 2017-08-27 ENCOUNTER — Encounter: Payer: Medicare Other | Admitting: Physician Assistant

## 2017-08-27 DIAGNOSIS — L89154 Pressure ulcer of sacral region, stage 4: Secondary | ICD-10-CM | POA: Diagnosis not present

## 2017-08-29 NOTE — Progress Notes (Signed)
AUSET, FRITZLER ([id number]) Visit Report for 08/27/2017 Chief Complaint Document Details [name], Jessica [name]. Date of Service: 08/27/2017 11:15 AM Medical Record Number: [id number] Patient Account Number: [id number] Date of Birth/Sex: March 27, 1946 (72 y.o. F) Treating RN: Curtis Sites Primary Care Provider: Annita Brod Other Clinician: Referring Provider: Annita Brod Treating Provider/Extender: Linwood Dibbles, Deicy Rusk Weeks in Treatment: 13 Information Obtained from: Patient Chief Complaint she is here for evaluation of a sacral ulcer and bilateral lower extremity ulcers Electronic Signature(s) Signed: 08/28/2017 1:18:07 PM By: Lenda Kelp PA-C Entered By: Lenda Kelp on 08/27/2017 11:51:26 Jessica [name] ([id number]) -------------------------------------------------------------------------------- Debridement Details [name]. Date of Service: 08/27/2017 11:15 AM Medical Record Number: [id number] Patient Account Number: [id number] Date of Birth/Sex: 1945/12/23 (72 y.o. F) Treating RN: Curtis Sites Primary Care Provider: Annita Brod Other Clinician: Referring Provider: Annita Brod Treating Provider/Extender: STONE III, Berkley Wrightsman Weeks in Treatment: 13 Debridement Performed for Wound #1 Medial Sacrum Assessment: Performed By: Physician STONE III, Berda Shelvin E., PA-C Debridement Type: Debridement Pre-procedure Verification/Time Yes - 11:59 Out Taken: Start Time: 11:59 Pain Control: Lidocaine 4% Topical Solution Total Area Debrided (L x W): 3.5 (cm) x 2.5 (cm) = 8.75 (cm) Tissue and other material Viable, Non-Viable, Slough, Subcutaneous, Slough debrided: Level: Skin/Subcutaneous Tissue Debridement Description: Excisional Instrument: Curette Bleeding: Minimum Hemostasis Achieved: Pressure End Time: 12:04 Procedural Pain: 0 Response to Treatment: Procedure was tolerated well Level of Consciousness: Awake and Alert Post  Procedure Vitals: Temperature: 97.9 Pulse: 78 Respiratory Rate: 18 Blood Pressure: Systolic Blood Pressure: 127 Diastolic Blood Pressure: 56 Post Debridement Measurements of Total Wound Length: (cm) 7 Stage: Category/Stage IV Width: (cm) 5.6 Depth: (cm) 1.9 Volume: (cm) 58.496 Character of Wound/Ulcer Post Improved Debridement: Post Procedure Diagnosis Same as Pre-procedure Electronic Signature(s) Signed: 08/27/2017 4:56:04 PM By: Curtis Sites Signed: 08/28/2017 1:18:07 PM By: Lenda Kelp PA-C Entered By: Curtis Sites on 08/27/2017 12:06:45 Jessica [name] ([id number]) -------------------------------------------------------------------------------- HPI Details [name]. Date of Service: 08/27/2017 11:15 AM Medical Record Number: [id number] Patient Account Number: [id number] Date of Birth/Sex: 05-02-1945 (72 y.o. F) Treating RN: Curtis Sites Primary Care Provider: Annita Brod Other Clinician: Referring Provider: Annita Brod Treating Provider/Extender: Linwood Dibbles, Yakir Wenke Weeks in Treatment: 13 History of Present Illness HPI Description: 05/27/17-she is here in initial evaluation for a left-sided sacral stage IV pressure ulcer and bilateral lower extremity, lateral aspect, unstageable pressure ulcers. She is accompanied by her husband and her son, who are her primary caregivers. She is bedbound secondary to spinal stenosis. According to her son and husband she was hospitalized from 10/5-11/1 with healthcare associated pneumonia and altered mental status. During her hospitalization she was intubated, extubated on 10/27. She was discharged with Foley catheter and follow up with urology. According to her son and spouse she developed the sacral ulcer during hospitalization. Home health has been applying Santyl daily. She does have a low air loss mattress and is repositioned every 2-3 hours per her family's report. According to the son and spouse  she had an appointment with urology on 12/18 and during that appointment developed discoloration to her bilateral lower extremities which ultimately developed into unstageable pressure ulcers to the lateral aspects of her bilateral lower extremities. There has been no topical treatment applied to these. She continues to have home health. There is no concerns expressed regarding dietary intake, stating she eats 3 meals a day, eating was provided; she is supplemented with boost with protein. 06/03/17-she is here in follow-up evaluation  for sacral and bilateral lower extremity ulcers. Plain film x-ray done today reveals no distraction to the sacrum or coccyx, no visible abnormalities. Home health has ordered the negative pressure wound system but it has not been initiated. We will continue with Hydrofera Blue until initiation of negative pressure wound system and continue with Santyl to bilateral lower extremity ulcers. Follow-up next week 06/10/17-she is here in follow-up evaluation for sacral and bilateral lower extremity ulcers. The wound VAC will be available tomorrow per home health. We will initiate wound VAC therapy to the sacral ulcer 3 times weekly (Thursday, Saturday/Sunday, Tuesday). We will continue with Santyl to the lower extremity ulcers. The patient's son is checking into home health therapy over the weekend for [facility] changes, with the understanding that if VAC changes cannot be performed over the weekend he will need to change his mother's appointments to Monday, Wednesday or Friday. The sacral ulcer clinically does not appear infected but there has been a change in the amount of drainage acutely, there is no significant amount of devitalized tissue, there is no malodor. Wound culture was obtained to evaluate for occult infection we will hold off on antibiotic therapy until sensitivities are resulted. 06/18/17 on evaluation today patient appears to be doing fairly well in my opinion although  this is the first time I have seen this patient she has been previously evaluated by Tacey Ruiz here in our office. She is going to be switching to Fridays to see me due to the Wound VAC schedule being changed on Monday, Wednesday, and Friday. Subsequently she seems to be doing fairly well with the Wound VAC. Her son who was present during evaluation today states that he somewhat stresses of the Wound VAC in making sure that it was functioning appropriately. With that being said everything seems to be working well he knows what settings on the [facility] itself to look at and ensure that it is functioning properly. Overall the wound appears to be nice and clean there is no need for debridement today. She has no discomfort in her bilateral lower extremity ulcers also appear to be improving based on measurements and what this honest tell me about the overall appearance. 07/02/17 on evaluation today I noted in patients wound bed that was actually an odor that had not previously been noted. Subsequently there was also a small area of bone that was not previously noted during my last evaluation. This did appear to be necrotic and was being somewhat forced out by the body around the region of granulation. She does not have any pain which is good news. Nonetheless the overall appearance of the ulcer is making me concerned for the patient having developed osteomyelitis. Currently she is not been on any antibiotics and the Wound VAC has been doing fairly well in general. With that being said I do not think we need to continue the Wound VAC if she potentially has a bone infection. At least not until it is properly addressed with antibiotics. 07/09/17 on evaluation today patient appears to actually be doing very well in regard to her sacral ulcer compared to last week. There is actually no exposed bone at this point. Her pathology report showed early signs of osteomyelitis which had explained to the patient son is definitely  good news catching this early is often a key to getting it better without things worsening. With that being said still I do believe she needs to have a referral to infectious disease due to the osteomyelitis I am  gonna recommend that she continue with the doxycycline based on the culture results which showed Eikenella Corrodens as the Jessica [name], Jessica [name]. ([id number]) organism identified that tetracycline should work for. She does not seem to be having any discomfort whatsoever at this point. 07/16/17 on evaluation today patient actually appears to be doing rather well in regard to her sacral wound. I think that the original wound site actually appears much better than previously noted. With that being said she does have a new superficial injury on the right sacral region which appears to be due to according to the sign transport that occurred for her MRI unfortunately. Fortunately this is not too deep and I do think it can be managed but it was a new area that was not previously noted. Otherwise she has been tolerating the Dakinos soaked gauze packing without complication. 07/30/17 on evaluation today patient presents for reevaluation concerning her sacral wound. She has been tolerating the dressing changes without complication. With that being said her wound is doing so well at this point that I think she may be at the point where we could reinitiate the Wound VAC currently and hopefully see good results from this. I'm very pleased with how she has responded to the Dakinos soaked gauze packing. 08/13/17 evaluation today patient appears to be doing excellent in regard to her lower extremity ulcer this seems to be cleaning up very nicely. In regard to the sacral ulcer the area of trauma on the right lateral portion of the wound actually appears to be much better than previously noted during the last evaluation. The word has definitely filled in and the Wound VAC appears to be helpful I do believe. Overall  I'm very pleased with how things seem to be progressing at this time. Patient likewise is also happy that things are doing well. 08/27/17 on evaluation today patient presents for follow-up concerning her ongoing issues with her sacral ulcer and lower extremity ulcer. Fortunately she has been tolerating the dressing changes well complication the Wound VAC in general seems to have done very well up to this point. She does not have any evidence of infection which is good news. She does have some dark discoloration in central portion of the wound which is troublesome for the possibility of pressure injury to the site it sounds like her prior air mattress was not functioning properly her son has just bought a new one for her this is doing much better. Otherwise things seem to be progressing nicely. Electronic Signature(s) Signed: 08/28/2017 1:18:07 PM By: Lenda Kelp PA-C Entered By: Lenda Kelp on 08/27/2017 12:58:01 Jessica [name] ([id number]) -------------------------------------------------------------------------------- Physical Exam Details [name]. Date of Service: 08/27/2017 11:15 AM Medical Record Number: [id number] Patient Account Number: [id number] Date of Birth/Sex: 04-16-1945 (72 y.o. F) Treating RN: Curtis Sites Primary Care Provider: Annita Brod Other Clinician: Referring Provider: Annita Brod Treating Provider/Extender: STONE III, Shivaan Tierno Weeks in Treatment: 13 Constitutional Well-nourished and well-hydrated in no acute distress. Respiratory normal breathing without difficulty. clear to auscultation bilaterally. Cardiovascular regular rate and rhythm with normal S1, S2. Psychiatric this patient is able to make decisions and demonstrates good insight into disease process. Alert and Oriented x 3. pleasant and cooperative. Notes Patient's wound at this time shows evidence of good granulation of the majority the wound and the one exception  being the area of discoloration where she has gotten some pressure centrally where she has a prominent bony region. I did debride the necrotic tissue  from the surface of the wound at this point which he tolerated without complication. Post debridement the wound bed appears to be doing much better. Electronic Signature(s) Signed: 08/28/2017 1:18:07 PM By: Lenda Kelp PA-C Entered By: Lenda Kelp on 08/27/2017 12:59:03 Jessica [name] ([id number]) -------------------------------------------------------------------------------- Physician Orders Details [name]. Date of Service: 08/27/2017 11:15 AM Medical Record Number: [id number] Patient Account Number: [id number] Date of Birth/Sex: January 10, 1946 (72 y.o. F) Treating RN: Curtis Sites Primary Care Provider: Annita Brod Other Clinician: Referring Provider: Annita Brod Treating Provider/Extender: Linwood Dibbles, Windy Dudek Weeks in Treatment: 13 Verbal / Phone Orders: No Diagnosis Coding ICD-10 Coding Code Description L89.154 Pressure ulcer of sacral region, stage 4 M48.00 Spinal stenosis, site unspecified R54 Age-related physical debility G89.4 Chronic pain syndrome E78.2 Mixed hyperlipidemia L89.95 Pressure ulcer of unspecified site, unstageable Wound Cleansing Wound #1 Medial Sacrum o Clean wound with Normal Saline. Wound #2 Left Lower Leg o Clean wound with Normal Saline. Anesthetic (add to Medication List) Wound #1 Medial Sacrum o Topical Lidocaine 4% cream applied to wound bed prior to debridement (In Clinic Only). Wound #2 Left Lower Leg o Topical Lidocaine 4% cream applied to wound bed prior to debridement (In Clinic Only). Primary Wound Dressing Wound #2 Left Lower Leg o Santyl Ointment - PLACE ON OPEN WOUND AREAS ON LEFT LATERAL LOWER LEG o Silver Alginate - over santyl Secondary Dressing Wound #2 Left Lower Leg o Boardered Foam Dressing - or ABD pad and tape Dressing Change  Frequency Wound #1 Medial Sacrum o Change Dressing Monday, Wednesday, Friday Wound #2 Left Lower Leg o Change dressing every day. Follow-up Appointments Wound #1 Medial Sacrum Jessica [name], Jessica [name]. ([id number]) o Return Appointment in 2 weeks. Wound #2 Left Lower Leg o Return Appointment in 2 weeks. Off-Loading Wound #1 Medial Sacrum o Turn and reposition every 2 hours Wound #2 Left Lower Leg o Turn and reposition every 2 hours Additional Orders / Instructions Wound #1 Medial Sacrum o Increase protein intake. Wound #2 Left Lower Leg o Increase protein intake. Home Health Wound #1 Medial Sacrum o Continue Home Health Visits - [facility] o Home Health Nurse may visit PRN to address patientos wound care needs. o FACE TO FACE ENCOUNTER: MEDICARE and MEDICAID PATIENTS: I certify that this patient is under my care and that I had a face-to-face encounter that meets the physician face-to-face encounter requirements with this patient on this date. The encounter with the patient was in whole or in part for the following MEDICAL CONDITION: (primary reason for Home Healthcare) MEDICAL NECESSITY: I certify, that based on my findings, NURSING services are a medically necessary home health service. HOME BOUND STATUS: I certify that my clinical findings support that this patient is homebound (i.e., Due to illness or injury, pt requires aid of supportive devices such as crutches, cane, wheelchairs, walkers, the use of special transportation or the assistance of another person to leave their place of residence. There is a normal inability to leave the home and doing so requires considerable and taxing effort. Other absences are for medical reasons / religious services and are infrequent or of short duration when for other reasons). o If current dressing causes regression in wound condition, may D/C ordered dressing product/s and apply Normal Saline Moist Dressing daily until next  Wound Healing Center / Other MD appointment. Notify Wound Healing Center of regression in wound condition at [phone]. o Please direct any NON-WOUND related issues/requests for orders to patient's Primary Care Physician Wound #2  Left Lower Leg o Continue Home Health Visits - [facility] o Home Health Nurse may visit PRN to address patientos wound care needs. o FACE TO FACE ENCOUNTER: MEDICARE and MEDICAID PATIENTS: I certify that this patient is under my care and that I had a face-to-face encounter that meets the physician face-to-face encounter requirements with this patient on this date. The encounter with the patient was in whole or in part for the following MEDICAL CONDITION: (primary reason for Home Healthcare) MEDICAL NECESSITY: I certify, that based on my findings, NURSING services are a medically necessary home health service. HOME BOUND STATUS: I certify that my clinical findings support that this patient is homebound (i.e., Due to illness or injury, pt requires aid of supportive devices such as crutches, cane, wheelchairs, walkers, the use of special transportation or the assistance of another person to leave their place of residence. There is a normal inability to leave the home and doing so requires considerable and taxing effort. Other absences are for medical reasons / religious services and are infrequent or of short duration when for other reasons). o If current dressing causes regression in wound condition, may D/C ordered dressing product/s and apply Normal Saline Moist Dressing daily until next Wound Healing Center / Other MD appointment. Notify Wound Healing Center of regression in wound condition at [phone]. o Please direct any NON-WOUND related issues/requests for orders to patient's Primary Care Physician Jessica [name], Jessica [name]. ([id number]) Negative Pressure Wound Therapy Wound #1 Medial Sacrum o Wound VAC settings at 125/130 mmHg continuous pressure. Use  BLACK/GREEN foam to wound cavity. Use WHITE foam to fill any tunnel/s and/or undermining. Change VAC dressing 3 X WEEK. Change canister as indicated when full. Nurse may titrate settings and frequency of dressing changes as clinically indicated. Electronic Signature(s) Signed: 08/27/2017 4:56:04 PM By: Curtis Sites Signed: 08/28/2017 1:18:07 PM By: Lenda Kelp PA-C Entered By: Curtis Sites on 08/27/2017 12:07:33 Jessica [name] ([id number]) -------------------------------------------------------------------------------- Problem List Details [name]. Date of Service: 08/27/2017 11:15 AM Medical Record Number: [id number] Patient Account Number: [id number] Date of Birth/Sex: 1946-01-26 (72 y.o. F) Treating RN: Curtis Sites Primary Care Provider: Annita Brod Other Clinician: Referring Provider: Annita Brod Treating Provider/Extender: Linwood Dibbles, Stedman [place] Weeks in Treatment: 13 Active Problems ICD-10 Impacting Encounter Code Description Active Date Wound Healing Diagnosis L89.154 Pressure ulcer of sacral region, stage 4 05/27/2017 Yes M48.00 Spinal stenosis, site unspecified 05/27/2017 Yes R54 Age-related physical debility 05/27/2017 Yes G89.4 Chronic pain syndrome 05/27/2017 Yes E78.2 Mixed hyperlipidemia 05/27/2017 Yes L89.95 Pressure ulcer of unspecified site, unstageable 05/27/2017 Yes Inactive Problems Resolved Problems Electronic Signature(s) Signed: 08/28/2017 1:18:07 PM By: Lenda Kelp PA-C Entered By: Lenda Kelp on 08/27/2017 11:51:16 Jessica [name] ([id number]) -------------------------------------------------------------------------------- Progress Note Details [name]. Date of Service: 08/27/2017 11:15 AM Medical Record Number: [id number] Patient Account Number: [id number] Date of Birth/Sex: 04/25/1945 (72 y.o. F) Treating RN: Curtis Sites Primary Care Provider: Annita Brod Other Clinician: Referring  Provider: Annita Brod Treating Provider/Extender: Linwood Dibbles, Juanitta Earnhardt Weeks in Treatment: 13 Subjective Chief Complaint Information obtained from Patient she is here for evaluation of a sacral ulcer and bilateral lower extremity ulcers History of Present Illness (HPI) 05/27/17-she is here in initial evaluation for a left-sided sacral stage IV pressure ulcer and bilateral lower extremity, lateral aspect, unstageable pressure ulcers. She is accompanied by her husband and her son, who are her primary caregivers. She is bedbound secondary to spinal stenosis. According to her son and husband she  was hospitalized from 10/5-11/1 with healthcare associated pneumonia and altered mental status. During her hospitalization she was intubated, extubated on 10/27. She was discharged with Foley catheter and follow up with urology. According to her son and spouse she developed the sacral ulcer during hospitalization. Home health has been applying Santyl daily. She does have a low air loss mattress and is repositioned every 2-3 hours per her family's report. According to the son and spouse she had an appointment with urology on 12/18 and during that appointment developed discoloration to her bilateral lower extremities which ultimately developed into unstageable pressure ulcers to the lateral aspects of her bilateral lower extremities. There has been no topical treatment applied to these. She continues to have home health. There is no concerns expressed regarding dietary intake, stating she eats 3 meals a day, eating was provided; she is supplemented with boost with protein. 06/03/17-she is here in follow-up evaluation for sacral and bilateral lower extremity ulcers. Plain film x-ray done today reveals no distraction to the sacrum or coccyx, no visible abnormalities. Home health has ordered the negative pressure wound system but it has not been initiated. We will continue with Hydrofera Blue until initiation of  negative pressure wound system and continue with Santyl to bilateral lower extremity ulcers. Follow-up next week 06/10/17-she is here in follow-up evaluation for sacral and bilateral lower extremity ulcers. The wound VAC will be available tomorrow per home health. We will initiate wound VAC therapy to the sacral ulcer 3 times weekly (Thursday, Saturday/Sunday, Tuesday). We will continue with Santyl to the lower extremity ulcers. The patient's son is checking into home health therapy over the weekend for [facility] changes, with the understanding that if VAC changes cannot be performed over the weekend he will need to change his mother's appointments to Monday, Wednesday or Friday. The sacral ulcer clinically does not appear infected but there has been a change in the amount of drainage acutely, there is no significant amount of devitalized tissue, there is no malodor. Wound culture was obtained to evaluate for occult infection we will hold off on antibiotic therapy until sensitivities are resulted. 06/18/17 on evaluation today patient appears to be doing fairly well in my opinion although this is the first time I have seen this patient she has been previously evaluated by Tacey Ruiz here in our office. She is going to be switching to Fridays to see me due to the Wound VAC schedule being changed on Monday, Wednesday, and Friday. Subsequently she seems to be doing fairly well with the Wound VAC. Her son who was present during evaluation today states that he somewhat stresses of the Wound VAC in making sure that it was functioning appropriately. With that being said everything seems to be working well he knows what settings on the [facility] itself to look at and ensure that it is functioning properly. Overall the wound appears to be nice and clean there is no need for debridement today. She has no discomfort in her bilateral lower extremity ulcers also appear to be improving based on measurements and what this honest tell  me about the overall appearance. 07/02/17 on evaluation today I noted in patients wound bed that was actually an odor that had not previously been noted. Subsequently there was also a small area of bone that was not previously noted during my last evaluation. This did appear to be necrotic and was being somewhat forced out by the body around the region of granulation. She does not have any pain which is  good news. Nonetheless the overall appearance of the ulcer is making me concerned for the patient having developed osteomyelitis. Currently she is not been on any antibiotics and the Wound VAC has been doing fairly well in general. With that being said I do not think we need to continue the Wound VAC if she potentially has a bone infection. At least not until it is properly addressed with antibiotics. Jessica [name], Jessica [name] ([id number]) 07/09/17 on evaluation today patient appears to actually be doing very well in regard to her sacral ulcer compared to last week. There is actually no exposed bone at this point. 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 With that being said still I do believe she needs to have a referral to infectious disease due to the osteomyelitis I am gonna recommend that she continue with the doxycycline based on the culture results which showed Eikenella Corrodens as the organism identified that tetracycline should work for. She does not seem to be having any discomfort whatsoever at this point. 07/16/17 on evaluation today patient actually appears to be doing rather well in regard to her sacral wound. I think that the original wound site actually appears much better than previously noted. With that being said she does have a new superficial injury on the right sacral region which appears to be due to according to the sign  transport that occurred for her MRI unfortunately. Fortunately this is not too deep and I do think it can be managed but it was a new area that was not previously noted. Otherwise she has been tolerating the [place] s soaked gauze packing without complication. 07/30/17 on evaluation today patient presents for reevaluation concerning her sacral wound. She has been tolerating the dressing changes without complication. With that being said her wound is doing so well at this point that I think she may be at the point where we could reinitiate the Wound VAC currently and hopefully see good results from this. I'm very pleased with how she has responded to the [facility] s soaked gauze packing. 08/13/17 evaluation today patient appears to be doing excellent in regard to her lower extremity ulcer this seems to be cleaning up very nicely. In regard to the sacral ulcer the area of trauma on the right lateral portion of the wound actually appears to be much better than previously noted during the last evaluation. The word has definitely filled in and the Wound VAC appears to be helpful I do believe. Overall I'm very pleased with how things seem to be progressing at this time. Patient likewise is also happy that things are doing well. 08/27/17 on evaluation today patient presents for follow-up concerning her ongoing issues with her sacral ulcer and lower extremity ulcer. Fortunately she has been tolerating the dressing changes well complication the Wound VAC in general seems to have done very well up to this point. She does not have any evidence of infection which is good news. She does have some dark discoloration in central portion of the wound which is troublesome for the possibility of pressure injury to the site it sounds like her prior air mattress was not functioning properly her son has just bought a new one for her this is doing much better. Otherwise things seem to be progressing  nicely. Objective Constitutional Well-nourished and well-hydrated in no acute distress. Vitals Time Taken:  11:15 AM, Height: 70 in, Temperature: 97.9 F, Pulse: 78 bpm, Respiratory Rate: 18 breaths/min, Blood Pressure: 127/56 mmHg. Respiratory normal breathing without difficulty. clear to auscultation bilaterally. Cardiovascular regular rate and rhythm with normal S1, S2. Psychiatric this patient is able to make decisions and demonstrates good insight into disease process. Alert and Oriented x 3. pleasant and cooperative. Jessica [name], Jessica [name] ([id number]) General Notes: Patient's wound at this time shows evidence of good granulation of the majority the wound and the one exception being the area of discoloration where she has gotten some pressure centrally where she has a prominent bony region. I did debride the necrotic tissue from the surface of the wound at this point which he tolerated without complication. Post debridement the wound bed appears to be doing much better. Integumentary (Hair, Skin) Wound #1 status is Open. Original cause of wound was Pressure Injury. The wound is located on the Medial Sacrum. The wound measures 7cm length x 5.6cm width x 1.9cm depth; 30.788cm^2 area and 58.496cm^3 volume. There is bone, muscle, and Fat Layer (Subcutaneous Tissue) Exposed exposed. There is no tunneling noted, however, there is undermining starting at 8:00 and ending at 2:00 with a maximum distance of 3cm. There is a large amount of purulent drainage noted. Foul odor after cleansing was noted. The wound margin is distinct with the outline attached to the wound base. There is small (1-33%) red, pink, hyper - granulation within the wound bed. There is a large (67-100%) amount of necrotic tissue within the wound bed including Eschar and Adherent Slough. The periwound skin appearance exhibited: Excoriation, Induration, Scarring, Erythema. The periwound skin appearance did not exhibit: Callus,  Crepitus, Rash, Dry/Scaly, Maceration, Atrophie Blanche, Cyanosis, Ecchymosis, Hemosiderin Staining, Mottled, Pallor, Rubor. The surrounding wound skin color is noted with erythema which is circumferential. Periwound temperature was noted as No Abnormality. The periwound has tenderness on palpation. General Notes: quarter size necrotic tissue in center of wound. Wound #2 status is Open. Original cause of wound was Gradually Appeared. The wound is located on the Left Lower Leg. The wound measures 1.5cm length x 1cm width x 0.3cm depth; 1.178cm^2 area and 0.353cm^3 volume. There is Fat Layer (Subcutaneous Tissue) Exposed exposed. There is no tunneling or undermining noted. There is a large amount of serous drainage noted. The wound margin is distinct with the outline attached to the wound base. There is medium (34-66%) red, hyper - granulation within the wound bed. There is a medium (34-66%) amount of necrotic tissue within the wound bed including Adherent Slough. The periwound skin appearance exhibited: Excoriation, Erythema. The periwound skin appearance did not exhibit: Callus, Crepitus, Induration, Rash, Scarring, Dry/Scaly, Maceration, Atrophie Blanche, Cyanosis, Ecchymosis, Hemosiderin Staining, Mottled, Pallor, Rubor. The surrounding wound skin color is noted with erythema which is circumferential. Periwound temperature was noted as No Abnormality. The periwound has tenderness on palpation. 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 There was a Excisional Skin/Subcutaneous Tissue Debridement with a total area of 8.75 sq cm performed by STONE III,  Pattricia Weiher E., PA-C. With the following instrument(s): Curette to remove Viable and Non-Viable tissue/material. Material removed includes Subcutaneous Tissue and Slough and after achieving pain control using Lidocaine 4% Topical Solution. No  specimens were taken. A time out was conducted at 11:59, prior to the start of the procedure. A Minimum amount of bleeding was controlled with Pressure. Jessica [name], Jessica [name]. ([id number]) The procedure was tolerated well with a pain level of 0 throughout. Patient s Level of Consciousness post procedure was recorded as Awake and Alert and post-procedure vitals were taken including Temperature: 97.9 F, Pulse: 78 bpm, Respiratory Rate: 18 breaths/min, Blood Pressure: (127)/(56) mmHg. Post Debridement Measurements: 7cm length x 5.6cm width x 1.9cm depth; 58.496cm^3 volume. Post debridement Stage noted as Category/Stage IV. Character of Wound/Ulcer Post Debridement is improved. Post procedure Diagnosis Wound #1: Same as Pre-Procedure Plan Wound Cleansing: Wound #1 Medial Sacrum: Clean wound with Normal Saline. Wound #2 Left Lower Leg: Clean wound with Normal Saline. Anesthetic (add to Medication List): Wound #1 Medial Sacrum: Topical Lidocaine 4% cream applied to wound bed prior to debridement (In Clinic Only). Wound #2 Left Lower Leg: Topical Lidocaine 4% cream applied to wound bed prior to debridement (In Clinic Only). Primary Wound Dressing: Wound #2 Left Lower Leg: Santyl Ointment - PLACE ON OPEN WOUND AREAS ON LEFT LATERAL LOWER LEG Silver Alginate - over santyl Secondary Dressing: Wound #2 Left Lower Leg: Boardered Foam Dressing - or ABD pad and tape Dressing Change Frequency: Wound #1 Medial Sacrum: Change Dressing Monday, Wednesday, Friday Wound #2 Left Lower Leg: Change dressing every day. Follow-up Appointments: Wound #1 Medial Sacrum: Return Appointment in 2 weeks. Wound #2 Left Lower Leg: Return Appointment in 2 weeks. Off-Loading: Wound #1  Medial Sacrum: Turn and reposition every 2 hours Wound #2 Left Lower Leg: Turn and reposition every 2 hours Additional Orders / Instructions: Wound #1 Medial Sacrum: Increase protein intake. Wound #2 Left Lower Leg: Increase protein intake. Home Health: Wound #1 Medial Sacrum: Continue Home Health Visits - [facility] Nurse may visit PRN to address patient s wound care needs. FACE TO FACE ENCOUNTER: MEDICARE and MEDICAID PATIENTS: I certify that this patient is under my care and that I had a face-to-face encounter that meets the physician face-to-face encounter requirements with this patient on this date. The encounter with the patient was in whole or in part for the following MEDICAL CONDITION: (primary reason for Home Healthcare) MEDICAL NECESSITY: I certify, that based on my findings, NURSING services are a medically necessary home Jessica [name], Jessica [name]. ([id number]) health service. HOME BOUND STATUS: I certify that my clinical findings support that this patient is homebound (i.e., Due to illness or injury, pt requires aid of supportive devices such as crutches, cane, wheelchairs, walkers, the use of special transportation or the assistance of another person to leave their place of residence. There is a normal inability to leave the home and doing so requires considerable and taxing effort. Other absences are for medical reasons / religious services and are infrequent or of short duration when for other reasons). If current dressing causes regression in wound condition, may D/C ordered dressing product/s and apply Normal Saline Moist Dressing daily until next Wound Healing Center / Other MD appointment. Notify Wound Healing Center of regression in wound condition at [phone]. Please direct any NON-WOUND related issues/requests for orders to patient's Primary Care Physician Wound #2 Left Lower Leg: Continue Home Health Visits - [facility] Nurse may visit PRN to  address patient s wound care needs. FACE TO FACE ENCOUNTER: MEDICARE and MEDICAID PATIENTS: I certify that this patient is under my care and that I had a face-to-face encounter that meets the physician face-to-face encounter  requirements with this patient on this date. The encounter with the patient was in whole or in part for the following MEDICAL CONDITION: (primary reason for Home Healthcare) MEDICAL NECESSITY: I certify, that based on my findings, NURSING services are a medically necessary home health service. HOME BOUND STATUS: I certify that my clinical findings support that this patient is homebound (i.e., Due to illness or injury, pt requires aid of supportive devices such as crutches, cane, wheelchairs, walkers, the use of special transportation or the assistance of another person to leave their place of residence. There is a normal inability to leave the home and doing so requires considerable and taxing effort. Other absences are for medical reasons / religious services and are infrequent or of short duration when for other reasons). If current dressing causes regression in wound condition, may D/C ordered dressing product/s and apply Normal Saline Moist Dressing daily until next Wound Healing Center / Other MD appointment. Notify Wound Healing Center of regression in wound condition at [phone]. Please direct any NON-WOUND related issues/requests for orders to patient's Primary Care Physician Negative Pressure Wound Therapy: Wound #1 Medial Sacrum: Wound VAC settings at 125/130 mmHg continuous pressure. Use BLACK/GREEN foam to wound cavity. Use WHITE foam to fill any tunnel/s and/or undermining. Change VAC dressing 3 X WEEK. Change canister as indicated when full. Nurse may titrate settings and frequency of dressing changes as clinically indicated. I'm going to suggest currently that we continue with the Current wound care measures for the next week patient and her son are in  agreement with the plan. Hopefully this will continue to show signs of good improvement. Please see above for specific wound care orders. We will see patient for re-evaluation in 2 week(s) here in the clinic. If anything worsens or changes patient will contact our office for additional recommendations. Electronic Signature(s) Signed: 08/28/2017 1:18:07 PM By: Lenda Kelp PA-C Entered By: Lenda Kelp on 08/27/2017 12:59:22 Jessica [name] ([id number]) -------------------------------------------------------------------------------- ROS/PFSH Details [name]. Date of Service: 08/27/2017 11:15 AM Medical Record Number: [id number] Patient Account Number: [id number] Date of Birth/Sex: 1945-10-19 (72 y.o. F) Treating RN: Curtis Sites Primary Care Provider: Annita Brod Other Clinician: Referring Provider: Annita Brod Treating Provider/Extender: STONE III, Sunaina Ferrando Weeks in Treatment: 13 Wound History Constitutional Symptoms (General Health) Implantable Devices Electronic Signature(s) Unsigned Entered By: Lenda Kelp on 08/27/2017 12:58:20 Signature(s): Date(s): Jessica [name], Jessica [name] ([id number]) -------------------------------------------------------------------------------- SuperBill Details [name], Jessica [name]. Date of Service: 08/27/2017 Medical Record Number: [id number] Patient Account Number: [id number] Date of Birth/Sex: 18-Apr-1945 (72 y.o. F) Treating RN: Curtis Sites Primary Care Provider: Annita Brod Other Clinician: Referring Provider: Annita Brod Treating Provider/Extender: STONE III, Jakylan Ron Weeks in Treatment: 13 Diagnosis Coding ICD-10 Codes Code Description L89.154 Pressure ulcer of sacral region, stage 4 M48.00 Spinal stenosis, site unspecified R54 Age-related physical debility G89.4 Chronic pain syndrome E78.2 Mixed hyperlipidemia L89.95 Pressure ulcer of unspecified site, unstageable Facility Procedures CPT4  Code: [id number] Description: 11042 - DEB SUBQ TISSUE 20 SQ CM/< ICD-10 Diagnosis Description L89.95 Pressure ulcer of unspecified site, unstageable Modifier: Quantity: 1 Physician Procedures CPT4 Code: [id number] Description: 11042 - WC PHYS SUBQ TISS 20 SQ CM ICD-10 Diagnosis Description L89.95 Pressure ulcer of unspecified site, unstageable Modifier: Quantity: 1 Electronic Signature(s) Signed: 08/28/2017 1:18:07 PM By: Lenda Kelp PA-C Entered By: Lenda Kelp on 08/27/2017 12:59:37

## 2017-09-01 ENCOUNTER — Telehealth: Payer: Self-pay | Admitting: Radiology

## 2017-09-01 ENCOUNTER — Encounter: Payer: Self-pay | Admitting: *Deleted

## 2017-09-01 ENCOUNTER — Ambulatory Visit
Admission: RE | Admit: 2017-09-01 | Discharge: 2017-09-01 | Disposition: A | Discharge status: Home / Self Care | Payer: Medicare Other | Source: Ambulatory Visit | Attending: Urology | Admitting: Urology

## 2017-09-01 DIAGNOSIS — N2889 Other specified disorders of kidney and ureter: Secondary | ICD-10-CM

## 2017-09-01 HISTORY — PX: IR RADIOLOGIST EVAL & MGMT: IMG5224

## 2017-09-01 NOTE — Consult Note (Signed)
Chief Complaint: Patient was seen in consultation today for  Chief Complaint  Patient presents with  . Advice Only    Consult for Left Renal Mass     at the request of [name],Ashley  Referring Physician(s): [name],Ashley  History of Present Illness: Jessica [name] is a 72 y.o. female with complex past medical history, with recent incidental finding of left lower pole enhancing renal mass suspected renal cell carcinoma.  She is accompanied by her son and spouse.  She has additional GU issues including neurogenic bladder, hydronephrosis requiring Foley catheter placement, recent acute renal insufficiency attributed to ampicillin toxicity, nonobstructing right ureteral calculi. Past Medical History:  Diagnosis Date  . Chronic pain   . Depression   . Diastolic heart failure (HCC)   . HLD (hyperlipidemia)   . HTN (hypertension)   . Hypothyroidism   . Morbid obesity (HCC)   . Recurrent UTI   . Spinal stenosis   . Visual hallucinations    Decubitus ulcers after hospitalization in the fall 2018, under care of wound care center.  Past Surgical History:  Procedure Laterality Date  . ABDOMINAL HYSTERECTOMY    . BACK SURGERY    . HEMORROIDECTOMY      Allergies: Ambien [zolpidem tartrate]; Amoxicillin; and Zinc  Medications: Prior to Admission medications   Medication Sig Start Date End Date Taking? Authorizing Provider  acetaminophen (TYLENOL) 325 MG tablet Take 650 mg by mouth every 6 (six) hours as needed for mild pain.   Yes [provider]  albuterol (PROVENTIL HFA;VENTOLIN HFA) 108 (90 Base) MCG/ACT inhaler Inhale 2 puffs into the lungs every 4 (four) hours as needed for wheezing or shortness of breath.   Yes [provider]  albuterol (PROVENTIL) (2.5 MG/3ML) 0.083% nebulizer solution Take 2.5 mg by nebulization every 6 (six) hours as needed for wheezing or shortness of breath.   Yes [provider]  amlodipine-atorvastatin (CADUET)  2.5-10 MG tablet amlodipine 2.5 mg-atorvastatin 10 mg tablet  Take 1 tablet every day by oral route.   Yes [provider]  ARIPiprazole (ABILIFY) 2 MG tablet 1 BY MOUTH ONCE A DAY FOR MOOD AND HALLUCINATIONS 03/14/17  Yes [provider]  aspirin EC 81 MG tablet Take 81 mg by mouth daily.   Yes [provider]  baclofen (LIORESAL) 20 MG tablet Take 5-20 mg by mouth 3 (three) times daily. 10 mg every morning and 20 mg every evening   Yes [provider]  collagenase (SANTYL) ointment Apply topically daily. 02/11/17  Yes Mody, Sital, MD  fluconazole (DIFLUCAN) 150 MG tablet TAKE 1 TABLET BY MOUTH WEEKLY FOR PREVENTION OF UTI FROM YEAST-START AFTER 2 WEEKS OF DAILY FLUCONAZ 03/14/17  Yes [provider]  furosemide (LASIX) 20 MG tablet Take 20 mg by mouth daily.   Yes [provider]  levothyroxine (SYNTHROID, LEVOTHROID) 150 MCG tablet Take 150 mcg by mouth daily before breakfast.   Yes [provider]  polyethylene glycol (MIRALAX / GLYCOLAX) packet Take 17 g by mouth daily.   Yes [provider]  potassium chloride SA (K-DUR,KLOR-CON) 20 MEQ tablet Take 30 mEq by mouth daily.    Yes [provider]  pregabalin (LYRICA) 150 MG capsule Take 150 mg by mouth 3 (three) times daily.   Yes [provider]  Venlafaxine HCl 225 MG TB24 Take 225 mg by mouth daily.   Yes [provider]  vitamin B-12 (CYANOCOBALAMIN) 1000 MCG tablet Take 1,000 mcg by mouth daily.  Yes [provider]  liver oil-zinc oxide (DESITIN) 40 % ointment Apply 1 application topically as needed (affected area). Patient not taking: Reported on 09/01/2017 02/11/17   Adrian Saran, MD  Vitamin D, Ergocalciferol, (DRISDOL) 50000 units CAPS capsule Take 50,000 Units by mouth every 30 (thirty) days.    [provider]     Family History  Problem Relation Age of Onset  . Heart disease Father   . Deep vein thrombosis Father      Social History   Socioeconomic History  . Marital status: Married    Spouse name: Not on file  . Number of children: Not on file  . Years of education: Not on file  . Highest education level: Not on file  Occupational History  . Not on file  Social Needs  . Financial resource strain: Not on file  . Food insecurity:    Worry: Not on file    Inability: Not on file  . Transportation needs:    Medical: Not on file    Non-medical: Not on file  Tobacco Use  . Smoking status: Never Smoker  . Smokeless tobacco: Former Neurosurgeon    Types: Snuff  Substance and Sexual Activity  . Alcohol use: No  . Drug use: No  . Sexual activity: Not on file  Lifestyle  . Physical activity:    Days per week: Not on file    Minutes per session: Not on file  . Stress: Not on file  Relationships  . Social connections:    Talks on phone: Not on file    Gets together: Not on file    Attends religious service: Not on file    Active member of club or organization: Not on file    Attends meetings of clubs or organizations: Not on file    Relationship status: Not on file  Other Topics Concern  . Not on file  Social History Narrative  . Not on file    ECOG Status: 3 - Symptomatic, >50% confined to bed  Review of Systems: A 12 point ROS discussed and pertinent positives are indicated in the HPI above.  All other systems are negative.  Review of Systems  Vital Signs: BP 112/63   Pulse 89   Temp 98 F (36.7 C)   Resp 14   Ht  (1.778 m)   Wt 185 lb (83.9 kg)   SpO2 99%   BMI 26.54 kg/m   Physical Exam  Patient examined in bed. Constitutional: Oriented to person, place, and time. Well-developed and well-nourished. No distress. Obese. HENT:  Head: Normocephalic and atraumatic.  Eyes: Conjunctivae and EOM are normal. Right eye exhibits no discharge. Left eye exhibits no discharge. No scleral icterus.  Neck: No JVD present.  Pulmonary/Chest: Effort normal. No stridor. No respiratory  distress.  Abdomen: soft, non distended Neurological:  alert and oriented to person, place, and time.  Skin: Skin is warm and dry.  not diaphoretic.  Psychiatric:   normal mood and affect.   behavior is normal. Judgment and thought content normal.  Mallampati Score:     Imaging: CT ABDOMEN WITHOUT AND WITH CONTRAST  TECHNIQUE: Multidetector CT imaging of the abdomen was performed following the standard protocol before and following the bolus administration of intravenous contrast.  CONTRAST: ISOVUE-300 IOPAMIDOL (ISOVUE-300) INJECTION 61%  COMPARISON: CT 01/22/2017  FINDINGS: Lower chest: Bibasilar pleural effusions and passive atelectasis similar comparison exam.  Hepatobiliary: No focal hepatic lesion. Gallbladder collapsed.  Pancreas: Normal pancreatic  parenchymal. No duct dilatation). Mild inflammation of the tail the pancreas (image 31, series 6). Mild parenchymal hypoperfusion at the tail the pancreas.  Spleen: Normal  Adrenals/urinary tract: Adrenal glands are normal.  Exophytic from the lower pole of the LEFT kidney is an ovoid intermediate density lesion measuring 28 x 20 mm (image 42, series 2). On postcontrast imaging, lesion enhances uniformly and avidly (image series 6).  No additional enhancing renal lesions.  Similar lesion of the RIGHT hydronephrosis seen on comparison exam.  Stomach/Bowel: Stomach and limited view of the bowel is unremarkable  Vascular/Lymphatic: Abdominal aortic normal caliber. LEFT renal vein is normal. No retroperitoneal adenopathy.  Musculoskeletal: No aggressive osseous lesion. Compression fracture in the of prior thoracic spine likely posttraumatic  IMPRESSION: 1. Solid enhancing lesion exophytic from the lower pole of the LEFT kidney measuring 2.8 cm is most consistent with a renal cell carcinoma. 2. Inflammation at the tail of the pancreas suggests focal pancreatitis. Recommend biochemical correlation and  follow-up contrast CT or MRI imaging to ensure resolution. 3. System bibasilar atelectasis and effusions.   Electronically Signed By: Genevive Bi M.D. On: 03/12/2017 14:33    Labs:  CBC: Recent Labs    02/02/17 2255 02/04/17 0426 02/05/17 0421 02/06/17 0437  WBC 5.6 8.0 6.5 7.5  HGB 11.1* 9.1* 9.0* 9.4*  HCT 35.8 29.2* 28.2* 28.5*  PLT 269 267 210 218    COAGS: Recent Labs    01/01/17 2048 01/18/17 0353 02/02/17 0504  INR 1.16 1.11 1.15  APTT 39*  --   --     BMP: Recent Labs    02/05/17 0421 02/06/17 0437 02/09/17 0459 02/11/17 0446  NA 134* 137 140 139  K 4.4 4.1 3.4* 4.0  CL 102 102 101 101  CO2 27 28 32 33*  GLUCOSE 126* 121* 90 80  BUN 23* 26* 19 20  CALCIUM 7.7* 8.3* 8.6* 8.1*  CREATININE 1.62* 1.26* 0.71 0.64  GFRNONAA 31* 42* >60 >60  GFRAA 36* 49* >60 >60    LIVER FUNCTION TESTS: Recent Labs    01/19/17 1531 01/23/17 0359 02/02/17 0504 02/04/17 0457  BILITOT 0.8  0.8 0.7 0.6 0.6  AST ALT 10*  11* 10* 14 14  ALKPHOS 123  119 101 102 108  PROT 6.3*  6.3* 5.8* 5.9* 5.3*  ALBUMIN 2.1*  2.1* 1.9* 2.0* 1.8*    TUMOR MARKERS: No results for input(s): AFPTM, CEA, CA199, CHROMGRNA in the last 8760 hours.  Assessment and Plan:  My impression is that the patient has a 2.8 cm lower pole left renal mass presumably renal cell carcinoma.  No evidence of regional or distal metastatic disease.  She is a relatively poor surgical candidate and with her history of renal insufficiency nephron sparing procedures would be of benefit. There are lesion is quite approachable for percutaneous thermal ablation, which would hopefully be curative.  The colon and spleen are adequately displaced  from the lesion to be at minimal risk.  I would obtain a confirmatory core biopsy at the time of ablation, in order to exclude a rare benign solid renal tumor such as oncocytoma.  We reviewed the natural history of small renal cell carcinomas.   We reviewed treatment options including watchful waiting, surgical resection, and ablation procedures.  We reviewed in detail the percutaneous thermal ablation technique under CT guidance.  We discussed the details of the procedure, anticipated benefits, time course of symptom resolution, and postop symptomatology.  We  discussed the need for postoperative surveillance imaging assuming pathology is positive.  The patient and her family seem to understand and did ask appropriate questions, which were answered.    She is motivated to proceed.  Accordingly, we can set this up electively at her convenience, with coordination with anesthesia.  She would prefer having the procedure done in [facility] or [facility].   I think would be prudent to give her decubitus ulcer time to heal to minimize chance of superinfection of the treatment bed post ablation, and we can follow-up with her wound center care team on this.  Thank you for this interesting consult.  I greatly enjoyed meeting Jessica [name] and look forward to participating in their care.  I will keep you updated with her progress.  A copy of this report was sent to the requesting provider on this date.  Electronically Signed: Durwin Glaze 09/01/2017, 2:12 PM   I spent a total of  40 Minutes   in face to face in clinical consultation, greater than 50% of which was counseling/coordinating care for left renal cell carcinoma.

## 2017-09-01 NOTE — Progress Notes (Signed)
NIVEDITA, MIRABELLA ([id number]) Visit Report for 08/27/2017 Arrival Information Details [name], Jessica [name]. Date of Service: 08/27/2017 11:15 AM Medical Record Number: [id number] Patient Account Number: [id number] Date of Birth/Sex: 02-Jun-1945 (72 y.o. F) Treating RN: Rema Jasmine Primary Care Mortimer Bair: Annita Brod Other Clinician: Referring Tanielle Emigh: Annita Brod Treating Dacia Capers/Extender: Linwood Dibbles, HOYT Weeks in Treatment: 13 Visit Information History Since Last Visit All ordered tests and consults were completed: No Patient Arrived: Stretcher Added or deleted any medications: No Arrival Time: 11:11 Any new allergies or adverse reactions: No Accompanied By: son Had a fall or experienced change in No Transfer Assistance: Stretcher activities of daily living that may affect Patient Identification Verified: Yes risk of falls: Secondary Verification Process Completed: Yes Signs or symptoms of abuse/neglect since last visito No Patient Has Alerts: Yes Hospitalized since last visit: No Patient Alerts: allergic to zinc Implantable device outside of the clinic excluding No cellular tissue based products placed in the center since last visit: Pain Present Now: No Electronic Signature(s) Signed: 08/31/2017 3:46:25 PM By: Rema Jasmine Entered By: Rema Jasmine on 08/27/2017 11:15:28 Jessica [name] ([id number]) -------------------------------------------------------------------------------- Encounter Discharge Information Details [name]. Date of Service: 08/27/2017 11:15 AM Medical Record Number: [id number] Patient Account Number: [id number] Date of Birth/Sex: [birth year] (72 y.o. F) Treating RN: Phillis Haggis Primary Care Ashla Murph: Annita Brod Other Clinician: Referring Huyen Perazzo: Annita Brod Treating Rylei Masella/Extender: Linwood Dibbles, HOYT Weeks in Treatment: 13 Encounter Discharge Information Items Discharge Condition: Stable Ambulatory  Status: Stretcher Discharge Destination: Home Transportation: Ambulance Accompanied By: son Schedule Follow-up Appointment: Yes Clinical Summary of Care: Electronic Signature(s) Signed: 08/27/2017 1:43:03 PM By: Alejandro Mulling Entered By: Alejandro Mulling on 08/27/2017 13:43:03 Jessica [name] ([id number]) -------------------------------------------------------------------------------- Lower Extremity Assessment Details [name]. Date of Service: 08/27/2017 11:15 AM Medical Record Number: [id number] Patient Account Number: [id number] Date of Birth/Sex: [birth year] (72 y.o. F) Treating RN: Rema Jasmine Primary Care Devontae Casasola: Annita Brod Other Clinician: Referring Karlen Barbar: Annita Brod Treating Sigurd Pugh/Extender: STONE III, HOYT Weeks in Treatment: 13 Edema Assessment Assessed: [Left: No] [Right: No] Edema: [Left: N] [Right: o] Vascular Assessment Claudication: Claudication Assessment [Left:None] Pulses: Dorsalis Pedis Palpable: [Left:Yes] Posterior Tibial Extremity colors, hair growth, and conditions: Extremity Color: [Left:Normal] Hair Growth on Extremity: [Left:No] Temperature of Extremity: [Left:Warm] Capillary Refill: [Left:< 3 seconds] Toe Nail Assessment Left: Right: Thick: Yes Discolored: Yes Deformed: Yes Improper Length and Hygiene: No Electronic Signature(s) Signed: 08/31/2017 3:46:25 PM By: Rema Jasmine Entered By: Rema Jasmine on 08/27/2017 11:36:02 Jessica [name] ([id number]) -------------------------------------------------------------------------------- Multi Wound Chart Details [name]. Date of Service: 08/27/2017 11:15 AM Medical Record Number: [id number] Patient Account Number: [id number] Date of Birth/Sex: 07-19-1945 (72 y.o. F) Treating RN: Curtis Sites Primary Care Trigger Frasier: Annita Brod Other Clinician: Referring Govani Radloff: Annita Brod Treating Javeah Loeza/Extender: STONE III, HOYT Weeks in  Treatment: 13 Vital Signs Height(in): 70 Pulse(bpm): 78 Weight(lbs): Blood Pressure(mmHg): 127/56 Body Mass Index(BMI): Temperature(F): 97.9 Respiratory Rate 18 (breaths/min): Photos: [1:No Photos] [2:No Photos] [N/A:N/A] Wound Location: [1:Sacrum - Medial] [2:Left Lower Leg] [N/A:N/A] Wounding Event: [1:Pressure Injury] [2:Gradually Appeared] [N/A:N/A] Primary Etiology: [1:Pressure Ulcer] [2:Pressure Ulcer] [N/A:N/A] Comorbid History: [1:Anemia, Lymphedema, Deep Anemia, Lymphedema, Deep N/A Vein Thrombosis, Quadriplegia Vein Thrombosis, Quadriplegia] Date Acquired: [1:01/15/2017] [2:04/28/2017] [N/A:N/A] Weeks of Treatment: [1:13] [2:13] [N/A:N/A] Wound Status: [1:Open] [2:Open] [N/A:N/A] Measurements L x W x D [1:7x5.6x1.9] [2:1.5x1x0.3] [N/A:N/A] (cm) Area (cm) : [1:30.788] [2:1.178] [N/A:N/A] Volume (cm) : [1:58.496] [2:0.353] [N/A:N/A] % Reduction in Area: [1:16.50%] [2:-150.10%] [N/A:N/A] % Reduction  in Volume: [1:-58.70%] [2:-275.50%] [N/A:N/A] Starting Position 1 [1:8] (o'clock): Ending Position 1 [1:2] (o'clock): Maximum Distance 1 (cm): [1:3] Undermining: [1:Yes] [2:No] [N/A:N/A] Classification: [1:Category/Stage IV] [2:Category/Stage III] [N/A:N/A] Exudate Amount: [1:Large] [2:Large] [N/A:N/A] Exudate Type: [1:Purulent] [2:Serous] [N/A:N/A] Exudate Color: [1:yellow, brown, green] [2:amber] [N/A:N/A] Foul Odor After Cleansing: [1:Yes] [2:No] [N/A:N/A] Odor Anticipated Due to [1:No] [2:N/A] [N/A:N/A] Product Use: Wound Margin: [1:Distinct, outline attached] [2:Distinct, outline attached] [N/A:N/A] Granulation Amount: [1:Small (1-33%)] [2:Medium (34-66%)] [N/A:N/A] Granulation Quality: [1:Red, Pink, Hyper-granulation] [2:Red, Hyper-granulation] [N/A:N/A] Necrotic Amount: [1:Large (67-100%)] [2:Medium (34-66%)] [N/A:N/A] Necrotic Tissue: [1:Eschar, Adherent Slough] [2:Adherent Slough] [N/A:N/A] Exposed Structures: [1:Fat Layer (Subcutaneous Tissue) Exposed: Yes  Muscle: Yes Bone: Yes] [2:Fat Layer (Subcutaneous Tissue) Exposed: Yes Fascia: No Tendon: No] [N/A:N/A] Fascia: No Muscle: No Tendon: No Joint: No Joint: No Bone: No Epithelialization: None None N/A Periwound Skin Texture: Excoriation: Yes Excoriation: Yes N/A Induration: Yes Induration: No Scarring: Yes Callus: No Callus: No Crepitus: No Crepitus: No Rash: No Rash: No Scarring: No Periwound Skin Moisture: Maceration: No Maceration: No N/A Dry/Scaly: No Dry/Scaly: No Periwound Skin Color: Erythema: Yes Erythema: Yes N/A Atrophie Blanche: No Atrophie Blanche: No Cyanosis: No Cyanosis: No Ecchymosis: No Ecchymosis: No Hemosiderin Staining: No Hemosiderin Staining: No Mottled: No Mottled: No Pallor: No Pallor: No Rubor: No Rubor: No Erythema Location: Circumferential Circumferential N/A Temperature: No Abnormality No Abnormality N/A Tenderness on Palpation: Yes Yes N/A Wound Preparation: Ulcer Cleansing: Ulcer Cleansing: N/A Rinsed/Irrigated with Saline Rinsed/Irrigated with Saline Topical Anesthetic Applied: Topical Anesthetic Applied: Other: lidocaine 4% Other: lidocaine 4% Assessment Notes: quarter size necrotic tissue in N/A N/A center of wound. Treatment Notes Electronic Signature(s) Signed: 08/27/2017 4:56:04 PM By: Curtis Sites Entered By: Curtis Sites on 08/27/2017 12:00:31 Jessica [name] ([id number]) -------------------------------------------------------------------------------- Multi-Disciplinary Care Plan Details [name]. Date of Service: 08/27/2017 11:15 AM Medical Record Number: [id number] Patient Account Number: [id number] Date of Birth/Sex: 03-11-1946 (72 y.o. F) Treating RN: Curtis Sites Primary Care Durk Carmen: Annita Brod Other Clinician: Referring Dequandre Cordova: Annita Brod Treating Zissel Biederman/Extender: STONE III, HOYT Weeks in Treatment: 13 Active Inactive ` Orientation to the Wound Care  Program Nursing Diagnoses: Knowledge deficit related to the wound healing center program Goals: Patient/caregiver will verbalize understanding of the Wound Healing Center Program Date Initiated: 06/03/2017 Target Resolution Date: 06/17/2017 Goal Status: Active Interventions: Provide education on orientation to the wound center Notes: ` Pressure Nursing Diagnoses: Knowledge deficit related to causes and risk factors for pressure ulcer development Knowledge deficit related to management of pressures ulcers Potential for impaired tissue integrity related to pressure, friction, moisture, and shear Goals: Patient will remain free from development of additional pressure ulcers Date Initiated: 06/03/2017 Target Resolution Date: 06/17/2017 Goal Status: Active Patient will remain free of pressure ulcers Date Initiated: 06/03/2017 Target Resolution Date: 06/17/2017 Goal Status: Active Patient/caregiver will verbalize risk factors for pressure ulcer development Date Initiated: 06/03/2017 Target Resolution Date: 06/17/2017 Goal Status: Active Interventions: Assess: immobility, friction, shearing, incontinence upon admission and as needed Assess potential for pressure ulcer upon admission and as needed Notes: ` Wound/Skin Impairment Jessica [name], Jessica [name] ([id number]) Nursing Diagnoses: Impaired tissue integrity Goals: Patient/caregiver will verbalize understanding of skin care regimen Date Initiated: 06/03/2017 Target Resolution Date: 06/17/2017 Goal Status: Active Ulcer/skin breakdown will have a volume reduction of 30% by week 4 Date Initiated: 06/03/2017 Target Resolution Date: 06/17/2017 Goal Status: Active Interventions: Assess ulceration(s) every visit Treatment Activities: Patient referred to home care : 06/03/2017 Skin care regimen initiated : 06/03/2017 Notes: Electronic Signature(s) Signed: 08/27/2017 4:56:04 PM  By: Curtis Sites Entered By: Curtis Sites on 08/27/2017  11:58:04 Jessica [name] ([id number]) -------------------------------------------------------------------------------- Pain Assessment Details [name]. Date of Service: 08/27/2017 11:15 AM Medical Record Number: [id number] Patient Account Number: [id number] Date of Birth/Sex: 1945-12-16 (72 y.o. F) Treating RN: Rema Jasmine Primary Care Ward Boissonneault: Annita Brod Other Clinician: Referring Raiquan Chandler: Annita Brod Treating Rally Ouch/Extender: STONE III, HOYT Weeks in Treatment: 13 Active Problems Location of Pain Severity and Description of Pain Patient Has Paino No Site Locations Pain Management and Medication Current Pain Management: Electronic Signature(s) Signed: 08/31/2017 3:46:25 PM By: Rema Jasmine Entered By: Rema Jasmine on 08/27/2017 11:15:36 Jessica [name] ([id number]) -------------------------------------------------------------------------------- Patient/Caregiver Education Details [name]. Date of Service: 08/27/2017 11:15 AM Medical Record Number: [id number] Patient Account Number: [id number] Date of Birth/Gender: 07-Jul-1945 (72 y.o. F) Treating RN: Phillis Haggis Primary Care Physician: Annita Brod Other Clinician: Referring Physician: Annita Brod Treating Physician/Extender: Skeet Simmer in Treatment: 13 Education Assessment Education Provided To: Patient Education Topics Provided Wound/Skin Impairment: Handouts: Caring for Your Ulcer, Skin Care Do's and Dont's, Other: change dressing as ordered Methods: Demonstration, Explain/Verbal Responses: State content correctly Electronic Signature(s) Signed: 08/27/2017 4:38:26 PM By: Alejandro Mulling Entered By: Alejandro Mulling on 08/27/2017 13:43:19 Jessica [name] ([id number]) -------------------------------------------------------------------------------- Wound Assessment Details [name]. Date of Service: 08/27/2017 11:15  AM Medical Record Number: [id number] Patient Account Number: [id number] Date of Birth/Sex: 07/06/1945 (72 y.o. F) Treating RN: Rema Jasmine Primary Care Dorinne Graeff: Annita Brod Other Clinician: Referring Farrah Skoda: Annita Brod Treating Kenlynn Houde/Extender: STONE III, HOYT Weeks in Treatment: 13 Wound Status Wound Number: 1 Primary Pressure Ulcer Etiology: Wound Location: Sacrum - Medial Wound Status: Open Wounding Event: Pressure Injury Comorbid Anemia, Lymphedema, Deep Vein Date Acquired: 01/15/2017 History: Thrombosis, Quadriplegia Weeks Of Treatment: 13 Clustered Wound: No Photos Photo Uploaded By: Renne Crigler on 08/27/2017 16:32:59 Wound Measurements Length: (cm) 7 Width: (cm) 5.6 Depth: (cm) 1.9 Area: (cm) 30.788 Volume: (cm) 58.496 % Reduction in Area: 16.5% % Reduction in Volume: -58.7% Epithelialization: None Tunneling: No Undermining: Yes Starting Position (o'clock): 8 Ending Position (o'clock): 2 Maximum Distance: (cm) 3 Wound Description Classification: Category/Stage IV Wound Margin: Distinct, outline attached Exudate Amount: Large Exudate Type: Purulent Exudate Color: yellow, brown, green Foul Odor After Cleansing: Yes Due to Product Use: No Slough/Fibrino Yes Wound Bed Granulation Amount: Small (1-33%) Exposed Structure Granulation Quality: Red, Pink, Hyper-granulation Fascia Exposed: No Necrotic Amount: Large (67-100%) Fat Layer (Subcutaneous Tissue) Exposed: Yes Necrotic Quality: Eschar, Adherent Slough Tendon Exposed: No Muscle Exposed: Yes Jessica [name], SAUSEDO. ([id number]) Necrosis of Muscle: No Joint Exposed: No Bone Exposed: Yes Periwound Skin Texture Texture Color No Abnormalities Noted: No No Abnormalities Noted: No Callus: No Atrophie Blanche: No Crepitus: No Cyanosis: No Excoriation: Yes Ecchymosis: No Induration: Yes Erythema: Yes Rash: No Erythema Location: Circumferential Scarring: Yes Hemosiderin Staining:  No Mottled: No Moisture Pallor: No No Abnormalities Noted: No Rubor: No Dry / Scaly: No Maceration: No Temperature / Pain Temperature: No Abnormality Tenderness on Palpation: Yes Wound Preparation Ulcer Cleansing: Rinsed/Irrigated with Saline Topical Anesthetic Applied: Other: lidocaine 4%, Assessment Notes quarter size necrotic tissue in center of wound. Treatment Notes Wound #1 (Medial Sacrum) 1. Cleansed with: Clean wound with Normal Saline 2. Anesthetic Topical Lidocaine 4% cream to wound bed prior to debridement 3. Peri-wound Care: Skin Prep 7. Secured with Tape 8. Negative Pressure Wound Therapy Wound Vac to wound continuously at 148mm/hg pressure Black Foam Number of foam/gauze pieces used in the dressing =  Notes 3 pieces of black foam Electronic Signature(s) Signed: 08/31/2017 3:46:25 PM By: Rema Jasmine Entered By: Rema Jasmine on 08/27/2017 11:33:05 Jessica [name] ([id number]) -------------------------------------------------------------------------------- Wound Assessment Details [name]. Date of Service: 08/27/2017 11:15 AM Medical Record Number: [id number] Patient Account Number: [id number] Date of Birth/Sex: 08/19/1945 (72 y.o. F) Treating RN: Rema Jasmine Primary Care Emmit Oriley: Annita Brod Other Clinician: Referring Tijah Hane: Annita Brod Treating Ranferi Clingan/Extender: STONE III, HOYT Weeks in Treatment: 13 Wound Status Wound Number: 2 Primary Pressure Ulcer Etiology: Wound Location: Left Lower Leg Wound Status: Open Wounding Event: Gradually Appeared Comorbid Anemia, Lymphedema, Deep Vein Date Acquired: 04/28/2017 History: Thrombosis, Quadriplegia Weeks Of Treatment: 13 Clustered Wound: No Photos Photo Uploaded By: Renne Crigler on 08/27/2017 16:32:59 Wound Measurements Length: (cm) 1.5 Width: (cm) 1 Depth: (cm) 0.3 Area: (cm) 1.178 Volume: (cm) 0.353 % Reduction in Area: -150.1% % Reduction in Volume:  -275.5% Epithelialization: None Tunneling: No Undermining: No Wound Description Classification: Category/Stage III Wound Margin: Distinct, outline attached Exudate Amount: Large Exudate Type: Serous Exudate Color: amber Foul Odor After Cleansing: No Slough/Fibrino Yes Wound Bed Granulation Amount: Medium (34-66%) Exposed Structure Granulation Quality: Red, Hyper-granulation Fascia Exposed: No Necrotic Amount: Medium (34-66%) Fat Layer (Subcutaneous Tissue) Exposed: Yes Necrotic Quality: Adherent Slough Tendon Exposed: No Muscle Exposed: No Joint Exposed: No Bone Exposed: No Periwound Skin Texture Jessica [name], Jessica [name]. ([id number]) Texture Color No Abnormalities Noted: No No Abnormalities Noted: No Callus: No Atrophie Blanche: No Crepitus: No Cyanosis: No Excoriation: Yes Ecchymosis: No Induration: No Erythema: Yes Rash: No Erythema Location: Circumferential Scarring: No Hemosiderin Staining: No Mottled: No Moisture Pallor: No No Abnormalities Noted: No Rubor: No Dry / Scaly: No Maceration: No Temperature / Pain Temperature: No Abnormality Tenderness on Palpation: Yes Wound Preparation Ulcer Cleansing: Rinsed/Irrigated with Saline Topical Anesthetic Applied: Other: lidocaine 4%, Treatment Notes Wound #2 (Left Lower Leg) 1. Cleansed with: Clean wound with Normal Saline 2. Anesthetic Topical Lidocaine 4% cream to wound bed prior to debridement 3. Peri-wound Care: Skin Prep 4. Dressing Applied: Santyl Ointment 5. Secondary Dressing Applied ABD Pad Dry Gauze 7. Secured with Secretary/administrator) Signed: 08/31/2017 3:46:25 PM By: Rema Jasmine Entered By: Rema Jasmine on 08/27/2017 11:21:08 Jessica [name] ([id number]) -------------------------------------------------------------------------------- Vitals Details [name]. Date of Service: 08/27/2017 11:15 AM Medical Record Number: [id number] Patient Account Number:  [id number] Date of Birth/Sex: 27-Feb-1946 (72 y.o. F) Treating RN: Rema Jasmine Primary Care [name]: Annita Brod Other Clinician: Referring Yolani Vo: Annita Brod Treating Ardie Dragoo/Extender: STONE III, HOYT Weeks in Treatment: 13 Vital Signs Time Taken: 11:15 Temperature (F): 97.9 Height (in): 70 Pulse (bpm): 78 Respiratory Rate (breaths/min): 18 Blood Pressure (mmHg): 127/56 Reference Range: 80 - 120 mg / dl Electronic Signature(s) Signed: 08/31/2017 3:46:25 PM By: Rema Jasmine Entered By: Rema Jasmine on 08/27/2017 11:16:41

## 2017-09-01 NOTE — Telephone Encounter (Signed)
Called Allen Derry PA-C at [facility].  Dr Deanne Coffer and Leonard Schwartz discussed status of patient's decubitus and recommendations.  They are in agreement to postpone thermal ablation and biopsy of left renal mass at this time to give time for her decubitus ulcer to improve/heal.   Our office will follow up with wound care in 2-3 months for status update.  Braniya Farrugia Carmell Austria, RN 09/01/2017 4:47 PM

## 2017-09-10 ENCOUNTER — Encounter: Payer: Medicare Other | Admitting: Physician Assistant

## 2017-09-10 DIAGNOSIS — L89154 Pressure ulcer of sacral region, stage 4: Secondary | ICD-10-CM | POA: Diagnosis not present

## 2017-09-12 NOTE — Progress Notes (Signed)
Jessica [name] ([id number]) Visit Report for 09/10/2017 Chief Complaint Document Details [name], Jessica [name]. Date of Service: 09/10/2017 11:15 AM Medical Record Number: [id number] Patient Account Number: [id number] Date of Birth/Sex: 20-Aug-1945 (72 y.o. F) Treating RN: Curtis Sites Primary Care Provider: Annita Brod Other Clinician: Referring Provider: Annita Brod Treating Provider/Extender: Linwood Dibbles, HOYT Weeks in Treatment: 15 Information Obtained from: Patient Chief Complaint she is here for evaluation of a sacral ulcer and bilateral lower extremity ulcers Electronic Signature(s) Signed: 09/10/2017 11:45:50 PM By: Lenda Kelp PA-C Entered By: Lenda Kelp on 09/10/2017 13:06:03 Jessica [name] ([id number]) -------------------------------------------------------------------------------- HPI Details [name]. Date of Service: 09/10/2017 11:15 AM Medical Record Number: [id number] Patient Account Number: [id number] Date of Birth/Sex: 1945-08-21 (73 y.o. F) Treating RN: Curtis Sites Primary Care Provider: Annita Brod Other Clinician: Referring Provider: Annita Brod Treating Provider/Extender: Linwood Dibbles, HOYT Weeks in Treatment: 15 History of Present Illness HPI Description: 05/27/17-she is here in initial evaluation for a left-sided sacral stage IV pressure ulcer and bilateral lower extremity, lateral aspect, unstageable pressure ulcers. She is accompanied by her husband and her son, who are her primary caregivers. She is bedbound secondary to spinal stenosis. According to her son and husband she was hospitalized from 10/5-11/1 with healthcare associated pneumonia and altered mental status. During her hospitalization she was intubated, extubated on 10/27. She was discharged with Foley catheter and follow up with urology. According to her son and spouse she developed the sacral ulcer during hospitalization. Home health has  been applying Santyl daily. She does have a low air loss mattress and is repositioned every 2-3 hours per her family's report. According to the son and spouse she had an appointment with urology on 12/18 and during that appointment developed discoloration to her bilateral lower extremities which ultimately developed into unstageable pressure ulcers to the lateral aspects of her bilateral lower extremities. There has been no topical treatment applied to these. She continues to have home health. There is no concerns expressed regarding dietary intake, stating she eats 3 meals a day, eating was provided; she is supplemented with boost with protein. 06/03/17-she is here in follow-up evaluation for sacral and bilateral lower extremity ulcers. Plain film x-ray done today reveals no distraction to the sacrum or coccyx, no visible abnormalities. Home health has ordered the negative pressure wound system but it has not been initiated. We will continue with Hydrofera Blue until initiation of negative pressure wound system and continue with Santyl to bilateral lower extremity ulcers. Follow-up next week 06/10/17-she is here in follow-up evaluation for sacral and bilateral lower extremity ulcers. The wound VAC will be available tomorrow per home health. We will initiate wound VAC therapy to the sacral ulcer 3 times weekly (Thursday, Saturday/Sunday, Tuesday). We will continue with Santyl to the lower extremity ulcers. The patient's son is checking into home health therapy over the weekend for [facility] changes, with the understanding that if VAC changes cannot be performed over the weekend he will need to change his mother's appointments to Monday, Wednesday or Friday. The sacral ulcer clinically does not appear infected but there has been a change in the amount of drainage acutely, there is no significant amount of devitalized tissue, there is no malodor. Wound culture was obtained to evaluate for occult infection we  will hold off on antibiotic therapy until sensitivities are resulted. 06/18/17 on evaluation today patient appears to be doing fairly well in my opinion although this is the first time I  have seen this patient she has been previously evaluated by Tacey RuizLeah here in our office. She is going to be switching to Fridays to see me due to the Wound VAC schedule being changed on Monday, Wednesday, and Friday. Subsequently she seems to be doing fairly well with the Wound VAC. Her son who was present during evaluation today states that he somewhat stresses of the Wound VAC in making sure that it was functioning appropriately. With that being said everything seems to be working well he knows what settings on the [facility] HVAC itself to look at and ensure that it is functioning properly. Overall the wound appears to be nice and clean there is no need for debridement today. She has no discomfort in her bilateral lower extremity ulcers also appear to be improving based on measurements and what this honest tell me about the overall appearance. 07/02/17 on evaluation today I noted in patients wound bed that was actually an odor that had not previously been noted. Subsequently there was also a small area of bone that was not previously noted during my last evaluation. This did appear to be necrotic and was being somewhat forced out by the body around the region of granulation. She does not have any pain which is good news. Nonetheless the overall appearance of the ulcer is making me concerned for the patient having developed osteomyelitis. Currently she is not been on any antibiotics and the Wound VAC has been doing fairly well in general. With that being said I do not think we need to continue the Wound VAC if she potentially has a bone infection. At least not until it is properly addressed with antibiotics. 07/09/17 on evaluation today patient appears to actually be doing very well in regard to her sacral ulcer compared to last  week. There is actually no exposed bone at this point. 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 With that being said still I do believe she needs to have a referral to infectious disease due to the osteomyelitis I am gonna recommend that she continue with the doxycycline based on the culture results which showed Eikenella Corrodens as the Jessica BradyOVERMAN, Rayvin W. ([id number]) organism identified that tetracycline should work for. She does not seem to be having any discomfort whatsoever at this point. 07/16/17 on evaluation today patient actually appears to be doing rather well in regard to her sacral wound. I think that the original wound site actually appears much better than previously noted. With that being said she does have a new superficial injury on the right sacral region which appears to be due to according to the sign transport that occurred for her MRI unfortunately. Fortunately this is not too deep and I do think it can be managed but it was a new area that was not previously noted. Otherwise she has been tolerating the Dakinos soaked gauze packing without complication. 07/30/17 on evaluation today patient presents for reevaluation concerning her sacral wound. She has been tolerating the dressing changes without complication. With that being said her wound is doing so well at this point that I think she may be at the point where we could reinitiate the Wound VAC currently and hopefully see good results from this. I'm very pleased with how she has responded to the Dakinos soaked gauze packing. 08/13/17 evaluation today patient appears to be doing excellent in  regard to her lower extremity ulcer this seems to be cleaning up very nicely. In regard to the sacral ulcer the area of trauma on the right lateral portion of the wound actually appears  to be much better than previously noted during the last evaluation. The word has definitely filled in and the Wound VAC appears to be helpful I do believe. Overall I'm very pleased with how things seem to be progressing at this time. Patient likewise is also happy that things are doing well. 08/27/17 on evaluation today patient presents for follow-up concerning her ongoing issues with her sacral ulcer and lower extremity ulcer. Fortunately she has been tolerating the dressing changes well complication the Wound VAC in general seems to have done very well up to this point. She does not have any evidence of infection which is good news. She does have some dark discoloration in central portion of the wound which is troublesome for the possibility of pressure injury to the site it sounds like her prior air mattress was not functioning properly her son has just bought a new one for her this is doing much better. Otherwise things seem to be progressing nicely. 09/10/17 on evaluation today patient presents for follow-up concerning her sacral wound and lower extremity ulcer. She has been tolerating the switch of her dressing changes to the Dakinos soaked gauze dressing very well in regard to the sacrum. The left lateral lower extremity ulcer appears to have a new area open just distal to the one that we have been treating with Santyl and this is new since her last visit. There does not appear to be any evidence of infection which is good news. With that being said there's a lot of maceration here at the site and I feel this is likely due to the switch and the dressings it appears that due to the sticking of the dressings it will switch to utilizing a telfa pad over the Santyl. I think this caused a lot of drainage to be collected and situated in the region just under the dressing which has led to this causing which duration of breakdown. Overall there does not appear to be any significant pain which is good  news. Of note I did actually have a conversation with the radiologist who is an interventional radiologist with [facility]. 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 With that being said there does not appear to be any rush to get this done according to the interventional radiologist whom I spoke with. Therefore after discussion it was determined that there gonna wait about six months prior to considering the procedure to get time for hopefully the sacral wound to heal in the interim to at least some degree. Electronic Signature(s) Signed: 09/10/2017 11:45:50 PM By: Lenda Kelp PA-C Entered By: Lenda Kelp on 09/10/2017 13:08:17 Jessica [name] ([id number]) -------------------------------------------------------------------------------- Physical Exam Details [name]. Date of Service: 09/10/2017 11:15 AM Medical Record Number: [id number] Patient Account Number: [id number] Date of Birth/Sex: 09/17/1945 (73 y.o. F) Treating RN: Curtis Sites Primary Care Provider: Annita Brod Other Clinician: Referring Provider: Annita Brod Treating Provider/Extender: STONE III, HOYT Weeks in Treatment: 15 Constitutional Well-nourished and well-hydrated in no acute distress. Respiratory normal breathing without difficulty. clear to auscultation bilaterally. Cardiovascular regular rate and rhythm with normal S1, S2. Psychiatric this patient is able to make decisions and demonstrates good insight into disease process. Alert and Oriented  x 3. pleasant and cooperative. Notes On evaluation today patient sacral wound shows evidence of minimal slough at this point there does however appear to be some contusion especially on the left border unfortunately. This is despite her being on an air mattress which she has been on for around 10 years according to her son he recently purchased out of  his own pocket a new air mattress in the past several months. I think this patient may be a candidate for an air fluidized bed but obviously we need to check with insurance to see if this is indeed a possibility. My main reason for considering this is the fact that even with an air mattress she still continues to get some pressure to the area evidenced by contusion noted today in the periwound and this is despite the fact that I know her son does the best he can to try to offload and protect her. Electronic Signature(s) Signed: 09/10/2017 11:45:50 PM By: Lenda KelpStone III, Hoyt PA-C Entered By: Lenda KelpStone III, Hoyt on 09/10/2017 13:12:23 Jessica BradyVERMAN, Kevina W. ([id number]) -------------------------------------------------------------------------------- Physician Orders Details Patient Name: Jessica BradyVERMAN, Suzzanne W. Date of Service: 09/10/2017 11:15 AM Medical Record Number: [id number] Patient Account Number: [id number] Date of Birth/Sex: 1946-01-30 (72 y.o. F) Treating RN: Curtis Sitesorthy, Joanna Primary Care Provider: Annita BrodASENSO, PHILIP Other Clinician: Referring Provider: Annita BrodASENSO, PHILIP Treating Provider/Extender: STONE III, HOYT Weeks in Treatment: 15 Verbal / Phone Orders: No Diagnosis Coding Wound Cleansing Wound #1 Medial Sacrum o Other: - please cleanse sacral wound with dakins moistened gauze - do not spray dakins on wound Wound #2 Left Lower Leg o Clean wound with Normal Saline. Wound #4 Left,Distal Lower Leg o Clean wound with Normal Saline. Anesthetic (add to Medication List) Wound #1 Medial Sacrum o Topical Lidocaine 4% cream applied to wound bed prior to debridement (In Clinic Only). Wound #2 Left Lower Leg o Topical Lidocaine 4% cream applied to wound bed prior to debridement (In Clinic Only). Wound #4 Left,Distal Lower Leg o Topical Lidocaine 4% cream applied to wound bed prior to debridement (In Clinic Only). Primary Wound Dressing Wound #2 Left Lower Leg o Mepitel One Contact layer o  Santyl Ointment - PLACE ON OPEN WOUND AREAS ON LEFT LATERAL LOWER LEG Wound #4 Left,Distal Lower Leg o Mepitel One Contact layer o Santyl Ointment - PLACE ON OPEN WOUND AREAS ON LEFT LATERAL LOWER LEG Wound #1 Medial Sacrum o Other: - dakins moistened gauze Secondary Dressing Wound #2 Left Lower Leg o Boardered Foam Dressing - or ABD pad and tape Wound #4 Left,Distal Lower Leg o Boardered Foam Dressing - or ABD pad and tape Wound #1 Medial Sacrum o ABD pad o XtraSorb Dressing Change Frequency Jessica BradyOVERMAN, Katianna W. ([id number]) Wound #1 Medial Sacrum o Change Dressing Monday, Wednesday, Friday Wound #2 Left Lower Leg o Change dressing every day. Follow-up Appointments Wound #1 Medial Sacrum o Return Appointment in 1 week. Wound #4 Left,Distal Lower Leg o Return Appointment in 1 week. Wound #2 Left Lower Leg o Return Appointment in 1 week. Off-Loading Wound #1 Medial Sacrum o Turn and reposition every 2 hours Wound #2 Left Lower Leg o Turn and reposition every 2 hours Wound #4 Left,Distal Lower Leg o Turn and reposition every 2 hours Additional Orders / Instructions Wound #1 Medial Sacrum o Increase protein intake. Wound #4 Left,Distal Lower Leg o Increase protein intake. Wound #2 Left Lower Leg o Increase protein intake. Home Health Wound #1 Medial Sacrum o Continue Home Health Visits - Advanced o Home Health Nurse  may visit PRN to address patientos wound care needs. o FACE TO FACE ENCOUNTER: MEDICARE and MEDICAID PATIENTS: I certify that this patient is under my care and that I had a face-to-face encounter that meets the physician face-to-face encounter requirements with this patient on this date. The encounter with the patient was in whole or in part for the following MEDICAL CONDITION: (primary reason for Home Healthcare) MEDICAL NECESSITY: I certify, that based on my findings, NURSING services are a medically necessary home  health service. HOME BOUND STATUS: I certify that my clinical findings support that this patient is homebound (i.e., Due to illness or injury, pt requires aid of supportive devices such as crutches, cane, wheelchairs, walkers, the use of special transportation or the assistance of another person to leave their place of residence. There is a normal inability to leave the home and doing so requires considerable and taxing effort. Other absences are for medical reasons / religious services and are infrequent or of short duration when for other reasons). o If current dressing causes regression in wound condition, may D/C ordered dressing product/s and apply Normal Saline Moist Dressing daily until next Wound Healing Center / Other MD appointment. Notify Wound Healing Center of regression in wound condition at [phone]. o Please direct any NON-WOUND related issues/requests for orders to patient's Primary Care Physician Wound #4 Left,Distal Lower Leg ABRIANA, SALTOS ([id number]) o Continue Home Health Visits - Advanced o Home Health Nurse may visit PRN to address patientos wound care needs. o FACE TO FACE ENCOUNTER: MEDICARE and MEDICAID PATIENTS: I certify that this patient is under my care and that I had a face-to-face encounter that meets the physician face-to-face encounter requirements with this patient on this date. The encounter with the patient was in whole or in part for the following MEDICAL CONDITION: (primary reason for Home Healthcare) MEDICAL NECESSITY: I certify, that based on my findings, NURSING services are a medically necessary home health service. HOME BOUND STATUS: I certify that my clinical findings support that this patient is homebound (i.e., Due to illness or injury, pt requires aid of supportive devices such as crutches, cane, wheelchairs, walkers, the use of special transportation or the assistance of another person to leave their place of residence. There  is a normal inability to leave the home and doing so requires considerable and taxing effort. Other absences are for medical reasons / religious services and are infrequent or of short duration when for other reasons). o If current dressing causes regression in wound condition, may D/C ordered dressing product/s and apply Normal Saline Moist Dressing daily until next Wound Healing Center / Other MD appointment. Notify Wound Healing Center of regression in wound condition at [phone]. o Please direct any NON-WOUND related issues/requests for orders to patient's Primary Care Physician Wound #2 Left Lower Leg o Continue Home Health Visits - Advanced o Home Health Nurse may visit PRN to address patientos wound care needs. o FACE TO FACE ENCOUNTER: MEDICARE and MEDICAID PATIENTS: I certify that this patient is under my care and that I had a face-to-face encounter that meets the physician face-to-face encounter requirements with this patient on this date. The encounter with the patient was in whole or in part for the following MEDICAL CONDITION: (primary reason for Home Healthcare) MEDICAL NECESSITY: I certify, that based on my findings, NURSING services are a medically necessary home health service. HOME BOUND STATUS: I certify that my clinical findings support that this patient is homebound (i.e., Due to illness or  injury, pt requires aid of supportive devices such as crutches, cane, wheelchairs, walkers, the use of special transportation or the assistance of another person to leave their place of residence. There is a normal inability to leave the home and doing so requires considerable and taxing effort. Other absences are for medical reasons / religious services and are infrequent or of short duration when for other reasons). o If current dressing causes regression in wound condition, may D/C ordered dressing product/s and apply Normal Saline Moist Dressing daily until next Wound  Healing Center / Other MD appointment. Notify Wound Healing Center of regression in wound condition at [phone]. o Please direct any NON-WOUND related issues/requests for orders to patient's Primary Care Physician Negative Pressure Wound Therapy Wound #1 Medial Sacrum o Place NPWT on HOLD. - DO NOT RETURN NPWT - IT WILL BE INITIATED AGAIN Electronic Signature(s) Signed: 09/10/2017 5:45:37 PM By: Curtis Sites Signed: 09/10/2017 11:45:50 PM By: Lenda Kelp PA-C Entered By: Curtis Sites on 09/10/2017 12:17:29 Jessica [name] ([id number]) -------------------------------------------------------------------------------- Problem List Details [name]. Date of Service: 09/10/2017 11:15 AM Medical Record Number: [id number] Patient Account Number: [id number] Date of Birth/Sex: [birth year] (72 y.o. F) Treating RN: Curtis Sites Primary Care Provider: Annita Brod Other Clinician: Referring Provider: Annita Brod Treating Provider/Extender: Linwood Dibbles, HOYT Weeks in Treatment: 15 Active Problems ICD-10 Impacting Encounter Code Description Active Date Wound Healing Diagnosis L89.154 Pressure ulcer of sacral region, stage 4 05/27/2017 No Yes M48.00 Spinal stenosis, site unspecified 05/27/2017 No Yes R54 Age-related physical debility 05/27/2017 No Yes G89.4 Chronic pain syndrome 05/27/2017 No Yes E78.2 Mixed hyperlipidemia 05/27/2017 No Yes L89.95 Pressure ulcer of unspecified site, unstageable 05/27/2017 No Yes Inactive Problems Resolved Problems Electronic Signature(s) Signed: 09/10/2017 11:45:50 PM By: Lenda Kelp PA-C Entered By: Lenda Kelp on 09/10/2017 13:05:57 Jessica [name] ([id number]) -------------------------------------------------------------------------------- Progress Note Details [name]. Date of Service: 09/10/2017 11:15 AM Medical Record Number: [id number] Patient Account Number: [id number] Date of  Birth/Sex: [birth year] (72 y.o. F) Treating RN: Curtis Sites Primary Care Provider: Annita Brod Other Clinician: Referring Provider: Annita Brod Treating Provider/Extender: Linwood Dibbles, HOYT Weeks in Treatment: 15 Subjective Chief Complaint Information obtained from Patient she is here for evaluation of a sacral ulcer and bilateral lower extremity ulcers History of Present Illness (HPI) 05/27/17-she is here in initial evaluation for a left-sided sacral stage IV pressure ulcer and bilateral lower extremity, lateral aspect, unstageable pressure ulcers. She is accompanied by her husband and her son, who are her primary caregivers. She is bedbound secondary to spinal stenosis. According to her son and husband she was hospitalized from 10/5-11/1 with healthcare associated pneumonia and altered mental status. During her hospitalization she was intubated, extubated on 10/27. She was discharged with Foley catheter and follow up with urology. According to her son and spouse she developed the sacral ulcer during hospitalization. Home health has been applying Santyl daily. She does have a low air loss mattress and is repositioned every 2-3 hours per her family's report. According to the son and spouse she had an appointment with urology on 12/18 and during that appointment developed discoloration to her bilateral lower extremities which ultimately developed into unstageable pressure ulcers to the lateral aspects of her bilateral lower extremities. There has been no topical treatment applied to these. She continues to have home health. There is no concerns expressed regarding dietary intake, stating she eats 3 meals a day, eating was provided; she is supplemented with boost with  protein. 06/03/17-she is here in follow-up evaluation for sacral and bilateral lower extremity ulcers. Plain film x-ray done today reveals no distraction to the sacrum or coccyx, no visible abnormalities. Home health has  ordered the negative pressure wound system but it has not been initiated. We will continue with Hydrofera Blue until initiation of negative pressure wound system and continue with Santyl to bilateral lower extremity ulcers. Follow-up next week 06/10/17-she is here in follow-up evaluation for sacral and bilateral lower extremity ulcers. The wound VAC will be available tomorrow per home health. We will initiate wound VAC therapy to the sacral ulcer 3 times weekly (Thursday, Saturday/Sunday, Tuesday). We will continue with Santyl to the lower extremity ulcers. The patient's son is checking into home health therapy over the weekend for [facility] changes, with the understanding that if VAC changes cannot be performed over the weekend he will need to change his mother's appointments to Monday, Wednesday or Friday. The sacral ulcer clinically does not appear infected but there has been a change in the amount of drainage acutely, there is no significant amount of devitalized tissue, there is no malodor. Wound culture was obtained to evaluate for occult infection we will hold off on antibiotic therapy until sensitivities are resulted. 06/18/17 on evaluation today patient appears to be doing fairly well in my opinion although this is the first time I have seen this patient she has been previously evaluated by Tacey Ruiz here in our office. She is going to be switching to Fridays to see me due to the Wound VAC schedule being changed on Monday, Wednesday, and Friday. Subsequently she seems to be doing fairly well with the Wound VAC. Her son who was present during evaluation today states that he somewhat stresses of the Wound VAC in making sure that it was functioning appropriately. With that being said everything seems to be working well he knows what settings on the [facility] itself to look at and ensure that it is functioning properly. Overall the wound appears to be nice and clean there is no need for debridement today. She has  no discomfort in her bilateral lower extremity ulcers also appear to be improving based on measurements and what this honest tell me about the overall appearance. 07/02/17 on evaluation today I noted in patients wound bed that was actually an odor that had not previously been noted. Subsequently there was also a small area of bone that was not previously noted during my last evaluation. This did appear to be necrotic and was being somewhat forced out by the body around the region of granulation. She does not have any pain which is good news. Nonetheless the overall appearance of the ulcer is making me concerned for the patient having developed osteomyelitis. Currently she is not been on any antibiotics and the Wound VAC has been doing fairly well in general. With that being said I do not think we need to continue the Wound VAC if she potentially has a bone infection. At least not until it is properly addressed with antibiotics. LANISSA, CASHEN ([id number]) 07/09/17 on evaluation today patient appears to actually be doing very well in regard to her sacral ulcer compared to last week. There is actually no exposed bone at this point. 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 With that being said still I do believe she needs to have  a referral to infectious disease due to the osteomyelitis I am gonna recommend that she continue with the doxycycline based on the culture results which showed Eikenella Corrodens as the organism identified that tetracycline should work for. She does not seem to be having any discomfort whatsoever at this point. 07/16/17 on evaluation today patient actually appears to be doing rather well in regard to her sacral wound. I think that the original wound site actually appears much better than previously noted. With that  being said she does have a new superficial injury on the right sacral region which appears to be due to according to the sign transport that occurred for her MRI unfortunately. Fortunately this is not too deep and I do think it can be managed but it was a new area that was not previously noted. Otherwise she has been tolerating the Dakin s soaked gauze packing without complication. 07/30/17 on evaluation today patient presents for reevaluation concerning her sacral wound. She has been tolerating the dressing changes without complication. With that being said her wound is doing so well at this point that I think she may be at the point where we could reinitiate the Wound VAC currently and hopefully see good results from this. I'm very pleased with how she has responded to the [facility] s soaked gauze packing. 08/13/17 evaluation today patient appears to be doing excellent in regard to her lower extremity ulcer this seems to be cleaning up very nicely. In regard to the sacral ulcer the area of trauma on the right lateral portion of the wound actually appears to be much better than previously noted during the last evaluation. The word has definitely filled in and the Wound VAC appears to be helpful I do believe. Overall I'm very pleased with how things seem to be progressing at this time. Patient likewise is also happy that things are doing well. 08/27/17 on evaluation today patient presents for follow-up concerning her ongoing issues with her sacral ulcer and lower extremity ulcer. Fortunately she has been tolerating the dressing changes well complication the Wound VAC in general seems to have done very well up to this point. She does not have any evidence of infection which is good news. She does have some dark discoloration in central portion of the wound which is troublesome for the possibility of pressure injury to the site it sounds like her prior air mattress was not functioning properly her son has  just bought a new one for her this is doing much better. Otherwise things seem to be progressing nicely. 09/10/17 on evaluation today patient presents for follow-up concerning her sacral wound and lower extremity ulcer. She has been tolerating the switch of her dressing changes to the Dakin s soaked gauze dressing very well in regard to the sacrum. The left lateral lower extremity ulcer appears to have a new area open just distal to the one that we have been treating with Santyl and this is new since her last visit. There does not appear to be any evidence of infection which is good news. With that being said there's a lot of maceration here at the site and I feel this is likely due to the switch and the dressings it appears that due to the sticking of the dressings it will switch to utilizing a telfa pad over the Santyl. I think this caused a lot of drainage to be collected and situated in the region just under the dressing which has led to this causing which duration  of breakdown. Overall there does not appear to be any significant pain which is good news. Of note I did actually have a conversation with the radiologist who is an interventional radiologist with [facility]. 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 With that being said there does not appear to be any rush to get this done according to the interventional radiologist whom I spoke with. Therefore after discussion it was determined that there gonna wait about six months prior to considering the procedure to get time for hopefully the sacral wound to heal in the interim to at least some degree. Patient History Information obtained from Patient. Family History No family history of Cancer, Diabetes, Heart Disease. Social History Never smoker, Marital Status - Married. Review of Systems (ROS) Constitutional Symptoms (General Health) Denies complaints or  symptoms of Fever, Chills. Respiratory IVYONNA, HOELZEL ([id number]) The patient has no complaints or symptoms. Cardiovascular The patient has no complaints or symptoms. Psychiatric The patient has no complaints or symptoms. Objective Constitutional Well-nourished and well-hydrated in no acute distress. Vitals Time Taken: 11:38 AM, Height: 70 in, Temperature: 97.9 F, Pulse: 79 bpm, Respiratory Rate: 16 breaths/min, Blood Pressure: 134/62 mmHg. Respiratory normal breathing without difficulty. clear to auscultation bilaterally. Cardiovascular regular rate and rhythm with normal S1, S2. Psychiatric this patient is able to make decisions and demonstrates good insight into disease process. Alert and Oriented x 3. pleasant and cooperative. General Notes: On evaluation today patient sacral wound shows evidence of minimal slough at this point there does however appear to be some contusion especially on the left border unfortunately. This is despite her being on an air mattress which she has been on for around 10 years according to her son he recently purchased out of his own pocket a new air mattress in the past several months. I think this patient may be a candidate for an air fluidized bed but obviously we need to check with insurance to see if this is indeed a possibility. My main reason for considering this is the fact that even with an air mattress she still continues to get some pressure to the area evidenced by contusion noted today in the periwound and this is despite the fact that I know her son does the best he can to try to offload and protect her. Integumentary (Hair, Skin) Wound #1 status is Open. Original cause of wound was Pressure Injury. The wound is located on the Medial Sacrum. The wound measures 8.2cm length x 5.6cm width x 2cm depth; 36.065cm^2 area and 72.131cm^3 volume. There is bone, muscle, and Fat Layer (Subcutaneous Tissue) Exposed exposed. Tunneling has been  noted at 12:00 with a maximum distance of 28.8cm. Undermining begins at 8:00 and ends at 11:00 with a maximum distance of 2cm. There is a large amount of purulent drainage noted. Foul odor after cleansing was noted. The wound margin is distinct with the outline attached to the wound base. There is small (1-33%) red, pink, hyper - granulation within the wound bed. There is a large (67-100%) amount of necrotic tissue within the wound bed including Eschar and Adherent Slough. The periwound skin appearance exhibited: Excoriation, Induration, Scarring, Erythema. The periwound skin appearance did not exhibit: Callus, Crepitus, Rash, Dry/Scaly, Maceration, Atrophie Blanche, Cyanosis, Ecchymosis, Hemosiderin Staining, Mottled, Pallor, Rubor. The surrounding wound skin color is noted with erythema which is circumferential. Periwound temperature was noted as No Abnormality. The periwound has tenderness on  palpation. Wound #2 status is Open. Original cause of wound was Gradually Appeared. The wound is located on the Left Lower Leg. The wound measures 1.5cm length x 1.5cm width x 0.3cm depth; 1.767cm^2 area and 0.53cm^3 volume. There is Fat Layer (Subcutaneous Tissue) Exposed exposed. There is no tunneling or undermining noted. There is a large amount of serous drainage noted. The wound margin is distinct with the outline attached to the wound base. There is medium (34-66%) red, Cookson, Tatjana W. ([id number]) hyper - granulation within the wound bed. There is a medium (34-66%) amount of necrotic tissue within the wound bed including Adherent Slough. The periwound skin appearance exhibited: Excoriation, Erythema. The periwound skin appearance did not exhibit: Callus, Crepitus, Induration, Rash, Scarring, Dry/Scaly, Maceration, Atrophie Blanche, Cyanosis, Ecchymosis, Hemosiderin Staining, Mottled, Pallor, Rubor. The surrounding wound skin color is noted with erythema which is circumferential. Periwound  temperature was noted as No Abnormality. The periwound has tenderness on palpation. Wound #4 status is Open. Original cause of wound was Pressure Injury. The wound is located on the Left,Distal Lower Leg. The wound measures 1cm length x 0.5cm width x 0.1cm depth; 0.393cm^2 area and 0.039cm^3 volume. There is Fat Layer (Subcutaneous Tissue) Exposed exposed. There is no tunneling or undermining noted. There is a large amount of serous drainage noted. The wound margin is indistinct and nonvisible. There is small (1-33%) granulation within the wound bed. There is a large (67-100%) amount of necrotic tissue within the wound bed including Adherent Slough. The periwound skin appearance exhibited: Erythema. The surrounding wound skin color is noted with erythema which is circumferential. 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 Wound #4 Left,Distal Lower Leg: Clean wound with Normal Saline. Anesthetic (add to Medication List): Wound #1 Medial Sacrum: Topical Lidocaine 4% cream applied to wound bed prior to debridement (In Clinic Only). Wound #2 Left Lower Leg: Topical Lidocaine 4% cream applied to wound bed prior to debridement (In Clinic Only). Wound #4 Left,Distal Lower Leg: Topical Lidocaine 4% cream applied to wound bed prior to debridement (In Clinic Only). Primary Wound Dressing: Wound #2 Left Lower Leg: Mepitel One Contact layer Santyl Ointment - PLACE ON OPEN WOUND AREAS ON LEFT LATERAL LOWER LEG Wound #4 Left,Distal Lower Leg: Mepitel One Contact  layer Santyl Ointment - PLACE ON OPEN WOUND AREAS ON LEFT LATERAL LOWER LEG Wound #1 Medial Sacrum: Other: - dakins moistened gauze ZADA, HASER ([id number]) Secondary Dressing: Wound #2 Left Lower Leg: Boardered Foam Dressing - or ABD pad and tape Wound #4 Left,Distal Lower Leg: Boardered Foam Dressing - or ABD pad and tape Wound #1 Medial Sacrum: ABD pad XtraSorb Dressing Change Frequency: Wound #1 Medial Sacrum: Change Dressing Monday, Wednesday, Friday Wound #2 Left Lower Leg: Change dressing every day. Follow-up Appointments: Wound #1 Medial Sacrum: Return Appointment in 1 week. Wound #4 Left,Distal Lower Leg: Return Appointment in 1 week. Wound #2 Left Lower Leg: Return Appointment in 1 week. Off-Loading: Wound #1 Medial Sacrum: Turn and reposition every 2 hours Wound #2 Left Lower Leg: Turn and reposition every 2 hours Wound #4 Left,Distal Lower Leg: Turn and reposition  every 2 hours Additional Orders / Instructions: Wound #1 Medial Sacrum: Increase protein intake. Wound #4 Left,Distal Lower Leg: Increase protein intake. Wound #2 Left Lower Leg: Increase protein intake. Home Health: Wound #1 Medial Sacrum: Continue Home Health Visits - [facility] Nurse may visit PRN to address patient s wound care needs. FACE TO FACE ENCOUNTER: MEDICARE and MEDICAID PATIENTS: I certify that this patient is under my care and that I had a face-to-face encounter that meets the physician face-to-face encounter requirements with this patient on this date. The encounter with the patient was in whole or in part for the following MEDICAL CONDITION: (primary reason for Home Healthcare) MEDICAL NECESSITY: I certify, that based on my findings, NURSING services are a medically necessary home health service. HOME BOUND STATUS: I certify that my clinical findings support that this patient is homebound (i.e., Due to illness or injury, pt requires aid of supportive devices such  as crutches, cane, wheelchairs, walkers, the use of special transportation or the assistance of another person to leave their place of residence. There is a normal inability to leave the home and doing so requires considerable and taxing effort. Other absences are for medical reasons / religious services and are infrequent or of short duration when for other reasons). If current dressing causes regression in wound condition, may D/C ordered dressing product/s and apply Normal Saline Moist Dressing daily until next Wound Healing Center / Other MD appointment. Notify Wound Healing Center of regression in wound condition at [phone]. Please direct any NON-WOUND related issues/requests for orders to patient's Primary Care Physician Wound #4 Left,Distal Lower Leg: Continue Home Health Visits - [facility] Nurse may visit PRN to address patient s wound care needs. FACE TO FACE ENCOUNTER: MEDICARE and MEDICAID PATIENTS: I certify that this patient is under my care and that I had a face-to-face encounter that meets the physician face-to-face encounter requirements with this patient on this date. The encounter with the patient was in whole or in part for the following MEDICAL CONDITION: (primary reason for Home Healthcare) MEDICAL NECESSITY: I certify, that based on my findings, NURSING services are a medically necessary home health service. HOME BOUND STATUS: I certify that my clinical findings support that this patient is homebound (i.e., Due to illness or injury, pt requires aid of supportive devices such as crutches, cane, wheelchairs, walkers, the use of special CRYSTALE, GIANNATTASIO. ([id number]) transportation or the assistance of another person to leave their place of residence. There is a normal inability to leave the home and doing so requires considerable and taxing effort. Other absences are for medical reasons / religious services and are infrequent or of short duration when for  other reasons). If current dressing causes regression in wound condition, may D/C ordered dressing product/s and apply Normal Saline Moist Dressing daily until next Wound Healing Center / Other MD appointment. Notify Wound Healing Center of regression in wound condition at [phone]. Please direct any NON-WOUND related issues/requests for orders to patient's Primary Care Physician Wound #2 Left Lower Leg: Continue Home Health Visits - [facility] Nurse may visit PRN to address patient s wound care needs. FACE TO FACE ENCOUNTER: MEDICARE and MEDICAID PATIENTS: I certify that this patient is under my care and that I had a face-to-face encounter that meets the physician face-to-face encounter requirements with this patient on this date. The encounter with the patient was in whole or in part for the following MEDICAL CONDITION: (primary reason for Home Healthcare) MEDICAL  NECESSITY: I certify, that based on my findings, NURSING services are a medically necessary home health service. HOME BOUND STATUS: I certify that my clinical findings support that this patient is homebound (i.e., Due to illness or injury, pt requires aid of supportive devices such as crutches, cane, wheelchairs, walkers, the use of special transportation or the assistance of another person to leave their place of residence. There is a normal inability to leave the home and doing so requires considerable and taxing effort. Other absences are for medical reasons / religious services and are infrequent or of short duration when for other reasons). If current dressing causes regression in wound condition, may D/C ordered dressing product/s and apply Normal Saline Moist Dressing daily until next Wound Healing Center / Other MD appointment. Notify Wound Healing Center of regression in wound condition at [phone]. Please direct any NON-WOUND related issues/requests for orders to patient's Primary Care Physician Negative  Pressure Wound Therapy: Wound #1 Medial Sacrum: Place NPWT on HOLD. - DO NOT RETURN NPWT - IT WILL BE INITIATED AGAIN I'm gonna suggest that we go ahead and initiate treatment per above for the next week. I do need to have her come back in a week for reevaluation in the clinic to see were things stand at that point. Obviously I'm hopeful that she continues to do well I'm not sure that she's really been progressing as well with the negative pressure wound therapy as I would've liked. In fact over the past several weeks it appears that the wound size has been pretty stable and if anything the volume and size has increased. Nonetheless this does have me somewhat concerned about whether the back is the appropriate way to go. We will see how things do over the next week with the current changes. Please see above for specific wound care orders. We will see patient for re-evaluation in 1 week(s) here in the clinic. If anything worsens or changes patient will contact our office for additional recommendations. Electronic Signature(s) Signed: 09/10/2017 11:45:50 PM By: Lenda Kelp PA-C Entered By: Lenda Kelp on 09/10/2017 13:13:39 Jessica [name] ([id number]) -------------------------------------------------------------------------------- ROS/PFSH Details [name]. Date of Service: 09/10/2017 11:15 AM Medical Record Number: [id number] Patient Account Number: [id number] Date of Birth/Sex: 17-Sep-1945 (72 y.o. F) Treating RN: Curtis Sites Primary Care Provider: Annita Brod Other Clinician: Referring Provider: Annita Brod Treating Provider/Extender: STONE III, HOYT Weeks in Treatment: 15 Information Obtained From Patient Wound History Constitutional Symptoms (General Health) Complaints and Symptoms: Negative for: Fever; Chills Respiratory Complaints and Symptoms: No Complaints or Symptoms Cardiovascular Complaints and Symptoms: No Complaints or  Symptoms Psychiatric Complaints and Symptoms: No Complaints or Symptoms Immunizations Pneumococcal Vaccine: Received Pneumococcal Vaccination: No Implantable Devices Family and Social History Cancer: No; Diabetes: No; Heart Disease: No; Never smoker; Marital Status - Married Physician Affirmation I have reviewed and agree with the above information. Electronic Signature(s) Signed: 09/10/2017 5:45:37 PM By: Curtis Sites Signed: 09/10/2017 11:45:50 PM By: Lenda Kelp PA-C Entered By: Lenda Kelp on 09/10/2017 13:11:43 Jessica [name] ([id number]) -------------------------------------------------------------------------------- SuperBill Details [name]. Date of Service: 09/10/2017 Medical Record Number: [id number] Patient Account Number: [id number] Date of Birth/Sex: [birth year] (72 y.o. F) Treating RN: Curtis Sites Primary Care Provider: Annita Brod Other Clinician: Referring Provider: Annita Brod Treating Provider/Extender: STONE III, HOYT Weeks in Treatment: 15 Diagnosis Coding ICD-10 Codes Code Description L89.154 Pressure ulcer of sacral region, stage 4 M48.00 Spinal stenosis, site unspecified R54 Age-related physical debility  G89.4 Chronic pain syndrome E78.2 Mixed hyperlipidemia L89.95 Pressure ulcer of unspecified site, unstageable Facility Procedures CPT4 Code: 16109604 Description: 99214 - WOUND CARE VISIT-LEV 4 EST PT Modifier: Quantity: 1 Physician Procedures CPT4 Code: 5409811 Description: 99214 - WC PHYS LEVEL 4 - EST PT ICD-10 Diagnosis Description L89.154 Pressure ulcer of sacral region, stage 4 M48.00 Spinal stenosis, site unspecified R54 Age-related physical debility G89.4 Chronic pain syndrome Modifier: Quantity: 1 Electronic Signature(s) Signed: 09/10/2017 11:45:50 PM By: Lenda Kelp PA-C Entered By: Lenda Kelp on 09/10/2017 13:13:58

## 2017-09-14 NOTE — Progress Notes (Addendum)
Jessica BradyOVERMAN, Maia W. ([id number]) Visit Report for 09/10/2017 Arrival Information Details Patient Name: Jessica BradyOVERMAN, [name] Date of Service: 09/10/2017 11:15 AM Medical Record Number: [id number] Patient Account Number: [id number] Date of Birth/Sex: 06-Apr-1946 (72 y.o. F) Treating RN: Huel CoventryWoody, Kim Primary Care Olumide Dolinger: Annita BrodASENSO, PHILIP Other Clinician: Referring Lelynd Poer: Annita BrodASENSO, PHILIP Treating Jahseh Lucchese/Extender: Linwood DibblesSTONE III, HOYT Weeks in Treatment: 15 Visit Information History Since Last Visit Added or deleted any medications: No Patient Arrived: Stretcher Any new allergies or adverse reactions: No Arrival Time: 11:37 Had a fall or experienced change in No Accompanied By: son activities of daily living that may affect Transfer Assistance: Stretcher risk of falls: Patient Identification Verified: Yes Signs or symptoms of abuse/neglect since last visito No Secondary Verification Process Completed: Yes Hospitalized since last visit: No Patient Has Alerts: Yes Implantable device outside of the clinic excluding No Patient Alerts: allergic to zinc cellular tissue based products placed in the center since last visit: Has Dressing in Place as Prescribed: No Pain Present Now: No Electronic Signature(s) Signed: 09/10/2017 5:30:19 PM By: Elliot GurneyWoody, BSN, RN, CWS, Kim RN, BSN Entered By: Elliot GurneyWoody, BSN, RN, CWS, Kim on 09/10/2017 11:37:53 Jessica BradyVERMAN, Ninoska W. ([id number]) -------------------------------------------------------------------------------- Clinic Level of Care Assessment Details Patient Name: Jessica BradyVERMAN, [name] Date of Service: 09/10/2017 11:15 AM Medical Record Number: [id number] Patient Account Number: [id number] Date of Birth/Sex: 06-Apr-1946 (72 y.o. F) Treating RN: Curtis Sitesorthy, Joanna Primary Care Rea Kalama: Annita BrodASENSO, PHILIP Other Clinician: Referring Jaziyah Gradel: Annita BrodASENSO, PHILIP Treating Denajah Farias/Extender: Linwood DibblesSTONE III, HOYT Weeks in Treatment: 15 Clinic Level of Care Assessment Items TOOL 4 Quantity  Score []  - Use when only an EandM is performed on FOLLOW-UP visit 0 ASSESSMENTS - Nursing Assessment / Reassessment X - Reassessment of Co-morbidities (includes updates in patient status) 1 10 X- 1 5 Reassessment of Adherence to Treatment Plan ASSESSMENTS - Wound and Skin Assessment / Reassessment []  - Simple Wound Assessment / Reassessment - one wound 0 X- 3 5 Complex Wound Assessment / Reassessment - multiple wounds []  - 0 Dermatologic / Skin Assessment (not related to wound area) ASSESSMENTS - Focused Assessment []  - Circumferential Edema Measurements - multi extremities 0 []  - 0 Nutritional Assessment / Counseling / Intervention X- 1 5 Lower Extremity Assessment (monofilament, tuning fork, pulses) []  - 0 Peripheral Arterial Disease Assessment (using hand held doppler) ASSESSMENTS - Ostomy and/or Continence Assessment and Care []  - Incontinence Assessment and Management 0 []  - 0 Ostomy Care Assessment and Management (repouching, etc.) PROCESS - Coordination of Care X - Simple Patient / Family Education for ongoing care 1 15 []  - 0 Complex (extensive) Patient / Family Education for ongoing care []  - 0 Staff obtains ChiropractorConsents, Records, Test Results / Process Orders []  - 0 Staff telephones HHA, Nursing Homes / Clarify orders / etc []  - 0 Routine Transfer to another Facility (non-emergent condition) []  - 0 Routine Hospital Admission (non-emergent condition) []  - 0 New Admissions / Manufacturing engineernsurance Authorizations / Ordering NPWT, Apligraf, etc. []  - 0 Emergency Hospital Admission (emergent condition) X- 1 10 Simple Discharge Coordination Jessica BradyOVERMAN, [name] ([id number]) []  - 0 Complex (extensive) Discharge Coordination PROCESS - Special Needs []  - Pediatric / Minor Patient Management 0 []  - 0 Isolation Patient Management []  - 0 Hearing / Language / Visual special needs []  - 0 Assessment of Community assistance (transportation, D/C planning, etc.) []  - 0 Additional  assistance / Altered mentation []  - 0 Support Surface(s) Assessment (bed, cushion, seat, etc.) INTERVENTIONS - Wound Cleansing / Measurement []  - Simple Wound Cleansing - one wound  0 X- 3 5 Complex Wound Cleansing - multiple wounds X- 1 5 Wound Imaging (photographs - any number of wounds) []  - 0 Wound Tracing (instead of photographs) []  - 0 Simple Wound Measurement - one wound X- 3 5 Complex Wound Measurement - multiple wounds INTERVENTIONS - Wound Dressings X - Small Wound Dressing one or multiple wounds 3 10 []  - 0 Medium Wound Dressing one or multiple wounds []  - 0 Large Wound Dressing one or multiple wounds []  - 0 Application of Medications - topical []  - 0 Application of Medications - injection INTERVENTIONS - Miscellaneous []  - External ear exam 0 []  - 0 Specimen Collection (cultures, biopsies, blood, body fluids, etc.) []  - 0 Specimen(s) / Culture(s) sent or taken to Lab for analysis []  - 0 Patient Transfer (multiple staff / Nurse, adult / Similar devices) []  - 0 Simple Staple / Suture removal (25 or less) []  - 0 Complex Staple / Suture removal (26 or more) []  - 0 Hypo / Hyperglycemic Management (close monitor of Blood Glucose) []  - 0 Ankle / Brachial Index (ABI) - do not check if billed separately X- 1 5 Vital Signs Bogle, Ardell W. ([id number]) Has the patient been seen at the hospital within the last three years: Yes Total Score: 130 Level Of Care: New/Established - Level 4 Electronic Signature(s) Signed: 09/10/2017 5:45:37 PM By: Curtis Sites Entered By: Curtis Sites on 09/10/2017 12:18:42 Jessica [name] ([id number]) -------------------------------------------------------------------------------- Complex / Palliative Patient Assessment Details [name]. Date of Service: 09/10/2017 11:15 AM Medical Record Number: [id number] Patient Account Number: [id number] Date of Birth/Sex: 1945-08-18 (72 y.o. F) Treating RN: Huel Coventry Primary Care Charlye Spare: Annita Brod Other Clinician: Referring Nasira Janusz: Annita Brod Treating Cierrah Dace/Extender: Linwood Dibbles, HOYT Weeks in Treatment: 15 Palliative Management Criteria Complex Wound Management Criteria Patient has remarkable or complex co-morbidities requiring medications or treatments that extend wound healing times. Examples: o Diabetes mellitus with chronic renal failure or end stage renal disease requiring dialysis o Advanced or poorly controlled rheumatoid arthritis o Diabetes mellitus and end stage chronic obstructive pulmonary disease o Active cancer with current chemo- or radiation therapy Obese and Immobility Care Approach Wound Care Plan: Complex Wound Management Electronic Signature(s) Signed: 09/14/2017 3:33:41 PM By: Elliot Gurney, BSN, RN, CWS, Kim RN, BSN Signed: 09/15/2017 2:38:49 AM By: Lenda Kelp PA-C Entered By: Elliot Gurney, BSN, RN, CWS, Kim on 09/14/2017 15:33:40 Jessica [name] ([id number]) -------------------------------------------------------------------------------- Encounter Discharge Information Details [name]. Date of Service: 09/10/2017 11:15 AM Medical Record Number: [id number] Patient Account Number: [id number] Date of Birth/Sex: 1945/08/20 (72 y.o. F) Treating RN: Phillis Haggis Primary Care Jael Waldorf: Annita Brod Other Clinician: Referring Ndrew Creason: Annita Brod Treating Glema Takaki/Extender: Linwood Dibbles, HOYT Weeks in Treatment: 15 Encounter Discharge Information Items Discharge Condition: Stable Ambulatory Status: Stretcher Discharge Destination: Home Transportation: Ambulance Accompanied By: son Schedule Follow-up Appointment: Yes Clinical Summary of Care: Electronic Signature(s) Signed: 09/10/2017 2:40:35 PM By: Alejandro Mulling Entered By: Alejandro Mulling on 09/10/2017 14:40:34 Jessica [name]  ([id number]) -------------------------------------------------------------------------------- Lower Extremity Assessment Details [name]. Date of Service: 09/10/2017 11:15 AM Medical Record Number: [id number] Patient Account Number: [id number] Date of Birth/Sex: May 02, 1945 (72 y.o. F) Treating RN: Huel Coventry Primary Care Leslieann Whisman: Annita Brod Other Clinician: Referring Jerelyn Trimarco: Annita Brod Treating Janely Gullickson/Extender: Linwood Dibbles, HOYT Weeks in Treatment: 15 Vascular Assessment Pulses: Dorsalis Pedis Palpable: [Left:Yes] Posterior Tibial Extremity colors, hair growth, and conditions: Extremity Color: [Left:Normal] Hair Growth on Extremity: [Left:No] Temperature of Extremity: [Left:Warm] Capillary  Refill: [Left:< 3 seconds] Toe Nail Assessment Left: Right: Thick: Yes Discolored: Yes Deformed: Yes Improper Length and Hygiene: Yes Electronic Signature(s) Signed: 09/10/2017 5:30:19 PM By: Elliot Gurney, BSN, RN, CWS, Kim RN, BSN Entered By: Elliot Gurney, BSN, RN, CWS, Kim on 09/10/2017 11:57:09 Jessica [name] ([id number]) -------------------------------------------------------------------------------- Multi Wound Chart Details [name]. Date of Service: 09/10/2017 11:15 AM Medical Record Number: [id number] Patient Account Number: [id number] Date of Birth/Sex: [birth year] (72 y.o. F) Treating RN: Curtis Sites Primary Care Jediah Horger: Annita Brod Other Clinician: Referring Aliveah Gallant: Annita Brod Treating Brittanny Levenhagen/Extender: STONE III, HOYT Weeks in Treatment: 15 Vital Signs Height(in): 70 Pulse(bpm): 79 Weight(lbs): Blood Pressure(mmHg): 134/62 Body Mass Index(BMI): Temperature(F): 97.9 Respiratory Rate 16 (breaths/min): Photos: [1:No Photos] [2:No Photos] [4:No Photos] Wound Location: [1:Sacrum - Medial] [2:Left Lower Leg] [4:Left Lower Leg - Distal] Wounding Event: [1:Pressure Injury] [2:Gradually Appeared] [4:Pressure  Injury] Primary Etiology: [1:Pressure Ulcer] [2:Pressure Ulcer] [4:Pressure Ulcer] Date Acquired: [1:01/15/2017] [2:04/28/2017] [4:09/10/2017] Weeks of Treatment: [1:15] [2:15] [4:0] Wound Status: [1:Open] [2:Open] [4:Open] Measurements L x W x D [1:8.2x5.6x2] [2:1.5x1.5x0.3] [4:1x0.5x0.1] (cm) Area (cm) : [1:36.065] [2:1.767] [4:0.393] Volume (cm) : [1:72.131] [2:0.53] [4:0.039] % Reduction in Area: [1:2.10%] [2:-275.20%] [4:0.00%] % Reduction in Volume: [1:-95.70%] [2:-463.80%] [4:0.00%] Position 1 (o'clock): [1:12] Maximum Distance 1 (cm): [1:28.8] Starting Position 1 [1:8] (o'clock): Ending Position 1 [1:11] (o'clock): Maximum Distance 1 (cm): [1:2] Tunneling: [1:Yes] [2:No] [4:No] Undermining: [1:Yes] [2:No] [4:No] Classification: [1:Category/Stage IV] [2:Category/Stage III] [4:Category/Stage II] Exudate Amount: [1:Large] [2:Large] [4:Large] Exudate Type: [1:Purulent] [2:Serous] [4:Serous] Exudate Color: [1:yellow, brown, green] [2:amber] [4:amber] Foul Odor After Cleansing: [1:Yes] [2:No] [4:No] Odor Anticipated Due to [1:No] [2:N/A] [4:N/A] Product Use: Wound Margin: [1:Distinct, outline attached] [2:Distinct, outline attached] [4:Indistinct, nonvisible] Granulation Amount: [1:Small (1-33%)] [2:Medium (34-66%)] [4:Small (1-33%)] Granulation Quality: [1:Red, Pink, Hyper-granulation] [2:Red, Hyper-granulation] [4:N/A] Necrotic Amount: [1:Large (67-100%)] [2:Medium (34-66%)] [4:Large (67-100%)] Necrotic Tissue: [1:Eschar, Adherent Slough] [2:Adherent Slough] [4:Adherent Slough] Exposed Structures: [1:Fat Layer (Subcutaneous Tissue) Exposed: Yes Muscle: Yes] [2:Fat Layer (Subcutaneous Tissue) Exposed: Yes Fascia: No] [4:Fat Layer (Subcutaneous Tissue) Exposed: Yes Fascia: No] Bone: Yes Tendon: No Tendon: No Fascia: No Muscle: No Muscle: No Tendon: No Joint: No Joint: No Joint: No Bone: No Bone: No Epithelialization: None None None Periwound Skin  Texture: Excoriation: Yes Excoriation: Yes No Abnormalities Noted Induration: Yes Induration: No Scarring: Yes Callus: No Callus: No Crepitus: No Crepitus: No Rash: No Rash: No Scarring: No Periwound Skin Moisture: Maceration: No Maceration: No No Abnormalities Noted Dry/Scaly: No Dry/Scaly: No Periwound Skin Color: Erythema: Yes Erythema: Yes Erythema: Yes Atrophie Blanche: No Atrophie Blanche: No Cyanosis: No Cyanosis: No Ecchymosis: No Ecchymosis: No Hemosiderin Staining: No Hemosiderin Staining: No Mottled: No Mottled: No Pallor: No Pallor: No Rubor: No Rubor: No Erythema Location: Circumferential Circumferential Circumferential Temperature: No Abnormality No Abnormality N/A Tenderness on Palpation: Yes Yes No Wound Preparation: Ulcer Cleansing: Ulcer Cleansing: Ulcer Cleansing: Rinsed/Irrigated with Saline Rinsed/Irrigated with Saline Rinsed/Irrigated with Saline Topical Anesthetic Applied: Topical Anesthetic Applied: Topical Anesthetic Applied: Other: lidocaine 4% Other: lidocaine 4% Other: lidocoane 4% Treatment Notes Electronic Signature(s) Signed: 09/10/2017 5:45:37 PM By: Curtis Sites Entered By: Curtis Sites on 09/10/2017 12:08:55 Jessica [name] ([id number]) -------------------------------------------------------------------------------- Multi-Disciplinary Care Plan Details [name]. Date of Service: 09/10/2017 11:15 AM Medical Record Number: [id number] Patient Account Number: [id number] Date of Birth/Sex: [birth year] (72 y.o. F) Treating RN: Curtis Sites Primary Care Janell Keeling: Annita Brod Other Clinician: Referring Marveline Profeta: Annita Brod Treating Jourdon Zimmerle/Extender: STONE III, HOYT Weeks in Treatment: 15 Active Inactive ` Orientation to  the Wound Care Program Nursing Diagnoses: Knowledge deficit related to the wound healing center program Goals: Patient/caregiver will verbalize understanding of the  Wound Healing Center Program Date Initiated: 06/03/2017 Target Resolution Date: 06/17/2017 Goal Status: Active Interventions: Provide education on orientation to the wound center Notes: ` Pressure Nursing Diagnoses: Knowledge deficit related to causes and risk factors for pressure ulcer development Knowledge deficit related to management of pressures ulcers Potential for impaired tissue integrity related to pressure, friction, moisture, and shear Goals: Patient will remain free from development of additional pressure ulcers Date Initiated: 06/03/2017 Target Resolution Date: 06/17/2017 Goal Status: Active Patient will remain free of pressure ulcers Date Initiated: 06/03/2017 Target Resolution Date: 06/17/2017 Goal Status: Active Patient/caregiver will verbalize risk factors for pressure ulcer development Date Initiated: 06/03/2017 Target Resolution Date: 06/17/2017 Goal Status: Active Interventions: Assess: immobility, friction, shearing, incontinence upon admission and as needed Assess potential for pressure ulcer upon admission and as needed Notes: ` Wound/Skin Impairment DAVEAH, VARONE ([id number]) Nursing Diagnoses: Impaired tissue integrity Goals: Patient/caregiver will verbalize understanding of skin care regimen Date Initiated: 06/03/2017 Target Resolution Date: 06/17/2017 Goal Status: Active Ulcer/skin breakdown will have a volume reduction of 30% by week 4 Date Initiated: 06/03/2017 Target Resolution Date: 06/17/2017 Goal Status: Active Interventions: Assess ulceration(s) every visit Treatment Activities: Patient referred to home care : 06/03/2017 Skin care regimen initiated : 06/03/2017 Notes: Electronic Signature(s) Signed: 09/10/2017 5:45:37 PM By: Curtis Sites Entered By: Curtis Sites on 09/10/2017 12:07:50 Jessica [name] ([id number]) -------------------------------------------------------------------------------- Pain Assessment Details Patient Name:  Jessica [name]. Date of Service: 09/10/2017 11:15 AM Medical Record Number: [id number] Patient Account Number: [id number] Date of Birth/Sex: 02/23/1946 (72 y.o. F) Treating RN: Huel Coventry Primary Care Donni Oglesby: Annita Brod Other Clinician: Referring Graison Leinberger: Annita Brod Treating Lavanda Nevels/Extender: Linwood Dibbles, HOYT Weeks in Treatment: 15 Active Problems Location of Pain Severity and Description of Pain Patient Has Paino No Site Locations With Dressing Change: No Pain Management and Medication Current Pain Management: Goals for Pain Management Topical or injectable lidocaine is offered to patient for acute pain when surgical debridement is performed. If needed, Patient is instructed to use over the counter pain medication for the following 24-48 hours after debridement. Wound care MDs do not prescribed pain medications. Patient has chronic pain or uncontrolled pain. Patient has been instructed to make an appointment with their Primary Care Physician for pain management. Electronic Signature(s) Signed: 09/10/2017 5:30:19 PM By: Elliot Gurney, BSN, RN, CWS, Kim RN, BSN Entered By: Elliot Gurney, BSN, RN, CWS, Kim on 09/10/2017 11:38:02 Jessica [name] ([id number]) -------------------------------------------------------------------------------- Patient/Caregiver Education Details [name]. Date of Service: 09/10/2017 11:15 AM Medical Record Number: [id number] Patient Account Number: [id number] Date of Birth/Gender: [birth year] (72 y.o. F) Treating RN: Phillis Haggis Primary Care Physician: Annita Brod Other Clinician: Referring Physician: Annita Brod Treating Physician/Extender: Linwood Dibbles, HOYT Weeks in Treatment: 15 Education Assessment Education Provided To: Patient Education Topics Provided Wound/Skin Impairment: Handouts: Caring for Your Ulcer, Skin Care Do's and Dont's, Other: change dressing as ordered Methods: Demonstration, Explain/Verbal Responses:  State content correctly Electronic Signature(s) Signed: 09/10/2017 5:38:20 PM By: Alejandro Mulling Entered By: Alejandro Mulling on 09/10/2017 14:40:51 Jessica [name] ([id number]) -------------------------------------------------------------------------------- Wound Assessment Details [name]. Date of Service: 09/10/2017 11:15 AM Medical Record Number: [id number] Patient Account Number: [id number] Date of Birth/Sex: 09-Nov-1945 (72 y.o. F) Treating RN: Rema Jasmine Primary Care Jaece Ducharme: Annita Brod Other Clinician: Referring Elide Stalzer: Annita Brod Treating Rusell Meneely/Extender: STONE III, HOYT Weeks in Treatment: 15 Wound Status  Wound Number: 1 Primary Etiology: Pressure Ulcer Wound Location: Sacrum - Medial Wound Status: Open Wounding Event: Pressure Injury Date Acquired: 01/15/2017 Weeks Of Treatment: 15 Clustered Wound: No Photos Photo Uploaded By: Elliot Gurney, BSN, RN, CWS, Kim on 09/10/2017 13:58:37 Wound Measurements Length: (cm) 8.2 % Reducti Width: (cm) 5.6 % Reducti Depth: (cm) 2 Epithelia Area: (cm) 36.065 Tunnelin Volume: (cm) 72.131 Posit Maximu on in Area: 2.1% on in Volume: -95.7% lization: None g: Yes ion (o'clock): 12 m Distance: (cm) 28.8 Undermining: Yes Starting Position (o'clock): 8 Ending Position (o'clock): 11 Maximum Distance: (cm) 2 Wound Description Classification: Category/Stage IV Foul Odor Wound Margin: Distinct, outline attached Due to Pr Exudate Amount: Large Slough/Fi Exudate Type: Purulent Exudate Color: yellow, brown, green After Cleansing: Yes oduct Use: No brino Yes Wound Bed Granulation Amount: Small (1-33%) Exposed Structure Granulation Quality: Red, Pink, Hyper-granulation Fascia Exposed: No TOMESHIA, PIZZI. ([id number]) Necrotic Amount: Large (67-100%) Fat Layer (Subcutaneous Tissue) Exposed: Yes Necrotic Quality: Eschar, Adherent Slough Tendon Exposed: No Muscle Exposed: Yes Necrosis of  Muscle: No Joint Exposed: No Bone Exposed: Yes Periwound Skin Texture Texture Color No Abnormalities Noted: No No Abnormalities Noted: No Callus: No Atrophie Blanche: No Crepitus: No Cyanosis: No Excoriation: Yes Ecchymosis: No Induration: Yes Erythema: Yes Rash: No Erythema Location: Circumferential Scarring: Yes Hemosiderin Staining: No Mottled: No Moisture Pallor: No No Abnormalities Noted: No Rubor: No Dry / Scaly: No Maceration: No Temperature / Pain Temperature: No Abnormality Tenderness on Palpation: Yes Wound Preparation Ulcer Cleansing: Rinsed/Irrigated with Saline Topical Anesthetic Applied: Other: lidocaine 4%, Treatment Notes Wound #1 (Medial Sacrum) 1. Cleansed with: Clean wound with Normal Saline 2. Anesthetic Topical Lidocaine 4% cream to wound bed prior to debridement 4. Dressing Applied: Other dressing (specify in notes) 5. Secondary Dressing Applied ABD Pad Dry Gauze 7. Secured with Tape Notes xtrasorb, Production designer, theatre/television/film) Signed: 09/10/2017 5:30:19 PM By: Elliot Gurney, BSN, RN, CWS, Kim RN, BSN Signed: 09/14/2017 11:57:38 AM By: Rema Jasmine Entered By: Elliot Gurney, BSN, RN, CWS, Kim on 09/10/2017 11:54:19 Jessica [name] ([id number]) -------------------------------------------------------------------------------- Wound Assessment Details [name]. Date of Service: 09/10/2017 11:15 AM Medical Record Number: [id number] Patient Account Number: [id number] Date of Birth/Sex: 12-25-1945 (72 y.o. F) Treating RN: Huel Coventry Primary Care Jamell Laymon: Annita Brod Other Clinician: Referring Diani Jillson: Annita Brod Treating Adryen Cookson/Extender: STONE III, HOYT Weeks in Treatment: 15 Wound Status Wound Number: 2 Primary Etiology: Pressure Ulcer Wound Location: Left Lower Leg Wound Status: Open Wounding Event: Gradually Appeared Date Acquired: 04/28/2017 Weeks Of Treatment: 15 Clustered Wound: No Photos Photo  Uploaded By: Elliot Gurney, BSN, RN, CWS, Kim on 09/10/2017 13:58:37 Wound Measurements Length: (cm) 1.5 Width: (cm) 1.5 Depth: (cm) 0.3 Area: (cm) 1.767 Volume: (cm) 0.53 % Reduction in Area: -275.2% % Reduction in Volume: -463.8% Epithelialization: None Tunneling: No Undermining: No Wound Description Classification: Category/Stage III Wound Margin: Distinct, outline attached Exudate Amount: Large Exudate Type: Serous Exudate Color: amber Foul Odor After Cleansing: No Slough/Fibrino Yes Wound Bed Granulation Amount: Medium (34-66%) Exposed Structure Granulation Quality: Red, Hyper-granulation Fascia Exposed: No Necrotic Amount: Medium (34-66%) Fat Layer (Subcutaneous Tissue) Exposed: Yes Necrotic Quality: Adherent Slough Tendon Exposed: No Muscle Exposed: No Joint Exposed: No Bone Exposed: No Periwound Skin Texture Teel, Brieana W. ([id number]) Texture Color No Abnormalities Noted: No No Abnormalities Noted: No Callus: No Atrophie Blanche: No Crepitus: No Cyanosis: No Excoriation: Yes Ecchymosis: No Induration: No Erythema: Yes Rash: No Erythema Location: Circumferential Scarring: No Hemosiderin Staining: No Mottled: No Moisture Pallor: No No  Abnormalities Noted: No Rubor: No Dry / Scaly: No Maceration: No Temperature / Pain Temperature: No Abnormality Tenderness on Palpation: Yes Wound Preparation Ulcer Cleansing: Rinsed/Irrigated with Saline Topical Anesthetic Applied: Other: lidocaine 4%, Treatment Notes Wound #2 (Left Lower Leg) 1. Cleansed with: Clean wound with Normal Saline 2. Anesthetic Topical Lidocaine 4% cream to wound bed prior to debridement 4. Dressing Applied: Santyl Ointment 5. Secondary Dressing Applied ABD Pad 7. Secured with Tape Notes Psychologist, occupational) Signed: 09/10/2017 5:30:19 PM By: Elliot Gurney, BSN, RN, CWS, Kim RN, BSN Entered By: Elliot Gurney, BSN, RN, CWS, Kim on 09/10/2017 11:54:42 Jessica [name]  ([id number]) -------------------------------------------------------------------------------- Wound Assessment Details [name]. Date of Service: 09/10/2017 11:15 AM Medical Record Number: [id number] Patient Account Number: [id number] Date of Birth/Sex: 05-27-1945 (72 y.o. F) Treating RN: Huel Coventry Primary Care Aniello Christopoulos: Annita Brod Other Clinician: Referring Shaquel Josephson: Annita Brod Treating Yomara Toothman/Extender: STONE III, HOYT Weeks in Treatment: 15 Wound Status Wound Number: 4 Primary Etiology: Pressure Ulcer Wound Location: Left Lower Leg - Distal Wound Status: Open Wounding Event: Pressure Injury Date Acquired: 09/10/2017 Weeks Of Treatment: 0 Clustered Wound: No Photos Photo Uploaded By: Elliot Gurney, BSN, RN, CWS, Kim on 09/10/2017 14:02:27 Wound Measurements Length: (cm) 1 Width: (cm) 0.5 Depth: (cm) 0.1 Area: (cm) 0.393 Volume: (cm) 0.039 % Reduction in Area: 0% % Reduction in Volume: 0% Epithelialization: None Tunneling: No Undermining: No Wound Description Classification: Category/Stage II Wound Margin: Indistinct, nonvisible Exudate Amount: Large Exudate Type: Serous Exudate Color: amber Foul Odor After Cleansing: No Slough/Fibrino Yes Wound Bed Granulation Amount: Small (1-33%) Exposed Structure Necrotic Amount: Large (67-100%) Fascia Exposed: No Necrotic Quality: Adherent Slough Fat Layer (Subcutaneous Tissue) Exposed: Yes Tendon Exposed: No Muscle Exposed: No Joint Exposed: No Bone Exposed: No Periwound Skin Texture ONNIE, ALATORRE. ([id number]) Texture Color No Abnormalities Noted: No No Abnormalities Noted: No Erythema: Yes Moisture Erythema Location: Circumferential No Abnormalities Noted: No Wound Preparation Ulcer Cleansing: Rinsed/Irrigated with Saline Topical Anesthetic Applied: Other: lidocoane 4%, Treatment Notes Wound #4 (Left, Distal Lower Leg) 1. Cleansed with: Clean wound with Normal Saline 2.  Anesthetic Topical Lidocaine 4% cream to wound bed prior to debridement 4. Dressing Applied: Other dressing (specify in notes) 5. Secondary Dressing Applied ABD Pad 7. Secured with Tape Notes Psychologist, occupational) Signed: 09/10/2017 5:30:19 PM By: Elliot Gurney, BSN, RN, CWS, Kim RN, BSN Entered By: Elliot Gurney, BSN, RN, CWS, Kim on 09/10/2017 11:55:50 Jessica [name] ([id number]) -------------------------------------------------------------------------------- Vitals Details [name]. Date of Service: 09/10/2017 11:15 AM Medical Record Number: [id number] Patient Account Number: [id number] Date of Birth/Sex: 09/07/1945 (72 y.o. F) Treating RN: Huel Coventry Primary Care Merrisa Skorupski: Annita Brod Other Clinician: Referring Jeneal Vogl: Annita Brod Treating Nicosha Struve/Extender: STONE III, HOYT Weeks in Treatment: 15 Vital Signs Time Taken: 11:38 Temperature (F): 97.9 Height (in): 70 Pulse (bpm): 79 Unable to obtain height and weight: Medical Reason Respiratory Rate (breaths/min): 16 Blood Pressure (mmHg): 134/62 Reference Range: 80 - 120 mg / dl Electronic Signature(s) Signed: 09/10/2017 5:30:19 PM By: Elliot Gurney, BSN, RN, CWS, Kim RN, BSN Entered By: Elliot Gurney, BSN, RN, CWS, Kim on 09/10/2017 11:38:32

## 2017-09-17 ENCOUNTER — Encounter: Payer: Medicare Other | Attending: Nurse Practitioner | Admitting: Nurse Practitioner

## 2017-09-17 DIAGNOSIS — G894 Chronic pain syndrome: Secondary | ICD-10-CM | POA: Diagnosis not present

## 2017-09-17 DIAGNOSIS — L89154 Pressure ulcer of sacral region, stage 4: Secondary | ICD-10-CM | POA: Diagnosis present

## 2017-09-17 DIAGNOSIS — Z7401 Bed confinement status: Secondary | ICD-10-CM | POA: Insufficient documentation

## 2017-09-17 DIAGNOSIS — Z888 Allergy status to other drugs, medicaments and biological substances status: Secondary | ICD-10-CM | POA: Insufficient documentation

## 2017-09-17 DIAGNOSIS — Y95 Nosocomial condition: Secondary | ICD-10-CM | POA: Diagnosis not present

## 2017-09-17 DIAGNOSIS — E782 Mixed hyperlipidemia: Secondary | ICD-10-CM | POA: Diagnosis not present

## 2017-09-17 DIAGNOSIS — Z881 Allergy status to other antibiotic agents status: Secondary | ICD-10-CM | POA: Diagnosis not present

## 2017-09-20 NOTE — Progress Notes (Signed)
Jessica [name], Jessica [name] ([id number]) Visit Report for 09/17/2017 Chief Complaint Document Details [name], Jessica [name]. Date of Service: 09/17/2017 9:00 AM Medical Record Number: [id number] Patient Account Number: [id number] Date of Birth/Sex: 1945/04/23 (72 y.o. F) Treating RN: Curtis Sites Primary Care Provider: Annita Brod Other Clinician: Referring Provider: Annita Brod Treating Provider/Extender: Kathreen Cosier in Treatment: 16 Information Obtained from: Patient Chief Complaint she is here for evaluation of a sacral ulcer and bilateral lower extremity ulcers Electronic Signature(s) Signed: 09/17/2017 10:04:46 AM By: Bonnell Public Entered By: Bonnell Public on 09/17/2017 10:04:46 Jessica [name] ([id number]) -------------------------------------------------------------------------------- HPI Details [name]. Date of Service: 09/17/2017 9:00 AM Medical Record Number: [id number] Patient Account Number: [id number] Date of Birth/Sex: 07/05/1945 (72 y.o. F) Treating RN: Curtis Sites Primary Care Provider: Annita Brod Other Clinician: Referring Provider: Annita Brod Treating Provider/Extender: Kathreen Cosier in Treatment: 16 History of Present Illness HPI Description: 05/27/17-she is here in initial evaluation for a left-sided sacral stage IV pressure ulcer and bilateral lower extremity, lateral aspect, unstageable pressure ulcers. She is accompanied by her husband and her son, who are her primary caregivers. She is bedbound secondary to spinal stenosis. According to her son and husband she was hospitalized from 10/5-11/1 with healthcare associated pneumonia and altered mental status. During her hospitalization she was intubated, extubated on 10/27. She was discharged with Foley catheter and follow up with urology. According to her son and spouse she developed the sacral ulcer during hospitalization. Home health has been applying Santyl  daily. She does have a low air loss mattress and is repositioned every 2-3 hours per her family's report. According to the son and spouse she had an appointment with urology on 12/18 and during that appointment developed discoloration to her bilateral lower extremities which ultimately developed into unstageable pressure ulcers to the lateral aspects of her bilateral lower extremities. There has been no topical treatment applied to these. She continues to have home health. There is no concerns expressed regarding dietary intake, stating she eats 3 meals a day, eating was provided; she is supplemented with boost with protein. 06/03/17-she is here in follow-up evaluation for sacral and bilateral lower extremity ulcers. Plain film x-ray done today reveals no distraction to the sacrum or coccyx, no visible abnormalities. Home health has ordered the negative pressure wound system but it has not been initiated. We will continue with Hydrofera Blue until initiation of negative pressure wound system and continue with Santyl to bilateral lower extremity ulcers. Follow-up next week 06/10/17-she is here in follow-up evaluation for sacral and bilateral lower extremity ulcers. The wound VAC will be available tomorrow per home health. We will initiate wound VAC therapy to the sacral ulcer 3 times weekly (Thursday, Saturday/Sunday, Tuesday). We will continue with Santyl to the lower extremity ulcers. The patient's son is checking into home health therapy over the weekend for [facility] changes, with the understanding that if VAC changes cannot be performed over the weekend he will need to change his mother's appointments to Monday, Wednesday or Friday. The sacral ulcer clinically does not appear infected but there has been a change in the amount of drainage acutely, there is no significant amount of devitalized tissue, there is no malodor. Wound culture was obtained to evaluate for occult infection we will hold off on  antibiotic therapy until sensitivities are resulted. 06/18/17 on evaluation today patient appears to be doing fairly well in my opinion although this is the first time I have seen this patient she  has been previously evaluated by Tacey Ruiz here in our office. She is going to be switching to Fridays to see me due to the Wound VAC schedule being changed on Monday, Wednesday, and Friday. Subsequently she seems to be doing fairly well with the Wound VAC. Her son who was present during evaluation today states that he somewhat stresses of the Wound VAC in making sure that it was functioning appropriately. With that being said everything seems to be working well he knows what settings on the [facility] itself to look at and ensure that it is functioning properly. Overall the wound appears to be nice and clean there is no need for debridement today. She has no discomfort in her bilateral lower extremity ulcers also appear to be improving based on measurements and what this honest tell me about the overall appearance. 07/02/17 on evaluation today I noted in patients wound bed that was actually an odor that had not previously been noted. Subsequently there was also a small area of bone that was not previously noted during my last evaluation. This did appear to be necrotic and was being somewhat forced out by the body around the region of granulation. She does not have any pain which is good news. Nonetheless the overall appearance of the ulcer is making me concerned for the patient having developed osteomyelitis. Currently she is not been on any antibiotics and the Wound VAC has been doing fairly well in general. With that being said I do not think we need to continue the Wound VAC if she potentially has a bone infection. At least not until it is properly addressed with antibiotics. 07/09/17 on evaluation today patient appears to actually be doing very well in regard to her sacral ulcer compared to last week. There is  actually no exposed bone at this point. 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 With that being said still I do believe she needs to have a referral to infectious disease due to the osteomyelitis I am gonna recommend that she continue with the doxycycline based on the culture results which showed Eikenella Corrodens as the Jessica [name], Jessica [name]. ([id number]) organism identified that tetracycline should work for. She does not seem to be having any discomfort whatsoever at this point. 07/16/17 on evaluation today patient actually appears to be doing rather well in regard to her sacral wound. I think that the original wound site actually appears much better than previously noted. With that being said she does have a new superficial injury on the right sacral region which appears to be due to according to the sign transport that occurred for her MRI unfortunately. Fortunately this is not too deep and I do think it can be managed but it was a new area that was not previously noted. Otherwise she has been tolerating the Dakinos soaked gauze packing without complication. 07/30/17 on evaluation today patient presents for reevaluation concerning her sacral wound. She has been tolerating the dressing changes without complication. With that being said her wound is doing so well at this point that I think she may be at the point where we could reinitiate the Wound VAC currently and hopefully see good results from this. I'm very pleased with how she has responded to the Dakinos soaked gauze packing. 08/13/17 evaluation today patient appears to be doing excellent in regard to her lower extremity  ulcer this seems to be cleaning up very nicely. In regard to the sacral ulcer the area of trauma on the right lateral portion of the wound actually appears to be much  better than previously noted during the last evaluation. The word has definitely filled in and the Wound VAC appears to be helpful I do believe. Overall I'm very pleased with how things seem to be progressing at this time. Patient likewise is also happy that things are doing well. 08/27/17 on evaluation today patient presents for follow-up concerning her ongoing issues with her sacral ulcer and lower extremity ulcer. Fortunately she has been tolerating the dressing changes well complication the Wound VAC in general seems to have done very well up to this point. She does not have any evidence of infection which is good news. She does have some dark discoloration in central portion of the wound which is troublesome for the possibility of pressure injury to the site it sounds like her prior air mattress was not functioning properly her son has just bought a new one for her this is doing much better. Otherwise things seem to be progressing nicely. 09/10/17 on evaluation today patient presents for follow-up concerning her sacral wound and lower extremity ulcer. She has been tolerating the switch of her dressing changes to the Dakinos soaked gauze dressing very well in regard to the sacrum. The left lateral lower extremity ulcer appears to have a new area open just distal to the one that we have been treating with Santyl and this is new since her last visit. There does not appear to be any evidence of infection which is good news. With that being said there's a lot of maceration here at the site and I feel this is likely due to the switch and the dressings it appears that due to the sticking of the dressings it will switch to utilizing a telfa pad over the Santyl. I think this caused a lot of drainage to be collected and situated in the region just under the dressing which has led to this causing which duration of breakdown. Overall there does not appear to be any significant pain which is good news. Of note  I did actually have a conversation with the radiologist who is an interventional radiologist with [facility]. 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 With that being said there does not appear to be any rush to get this done according to the interventional radiologist whom I spoke with. Therefore after discussion it was determined that there gonna wait about six months prior to considering the procedure to get time for hopefully the sacral wound to heal in the interim to at least some degree. 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 According to nursing staff these are all improved, the negative pressure wound therapy system was put on hold last week. We will resume negative pressure wound therapy today, continue with Santyl to the lower extremity and she will follow-up next week. Electronic Signature(s) Signed: 09/17/2017 10:06:24 AM By: Bonnell Public Entered By: Bonnell Public on 09/17/2017 10:06:24 Jessica [name] ([id number]) -------------------------------------------------------------------------------- Physician Orders Details [name]. Date of Service: 09/17/2017 9:00 AM Medical Record Number: [id number] Patient Account Number: [id number] Date of Birth/Sex: 25-Aug-1945 (72 y.o. F) Treating RN: Curtis Sites Primary Care Provider: Annita Brod Other Clinician: Referring Provider: Annita Brod Treating Provider/Extender: Kathreen Cosier  in Treatment: 16 Verbal / Phone Orders: No Diagnosis Coding Wound Cleansing Wound #1 Medial Sacrum o Other: - please cleanse sacral wound with dakins moistened gauze - do not spray dakins on wound Wound #2 Left Lower Leg o Clean wound with Normal Saline. Anesthetic (add to Medication List) Wound #1 Medial Sacrum o Topical Lidocaine 4% cream applied to wound bed prior to  debridement (In Clinic Only). Wound #2 Left Lower Leg o Topical Lidocaine 4% cream applied to wound bed prior to debridement (In Clinic Only). Primary Wound Dressing Wound #1 Medial Sacrum o Silver Alginate - to ridge around wound Wound #2 Left Lower Leg o Mepitel One Contact layer o Santyl Ointment - PLACE ON OPEN WOUND AREAS ON LEFT LATERAL LOWER LEG Secondary Dressing Wound #2 Left Lower Leg o Boardered Foam Dressing - or ABD pad and tape Dressing Change Frequency Wound #1 Medial Sacrum o Change Dressing Monday, Wednesday, Friday Wound #2 Left Lower Leg o Change dressing every day. Follow-up Appointments Wound #1 Medial Sacrum o Return Appointment in 1 week. Wound #2 Left Lower Leg o Return Appointment in 1 week. Off-Loading Wound #1 Medial Sacrum Jessica BradyOVERMAN, [name] ([id number]) o Turn and reposition every 2 hours Wound #2 Left Lower Leg o Turn and reposition every 2 hours Additional Orders / Instructions Wound #1 Medial Sacrum o Increase protein intake. Wound #2 Left Lower Leg o Increase protein intake. Home Health Wound #1 Medial Sacrum o Continue Home Health Visits - [facility] o Home Health Nurse may visit PRN to address patientos wound care needs. o FACE TO FACE ENCOUNTER: MEDICARE and MEDICAID PATIENTS: I certify that this patient is under my care and that I had a face-to-face encounter that meets the physician face-to-face encounter requirements with this patient on this date. The encounter with the patient was in whole or in part for the following MEDICAL CONDITION: (primary reason for Home Healthcare) MEDICAL NECESSITY: I certify, that based on my findings, NURSING services are a medically necessary home health service. HOME BOUND STATUS: I certify that my clinical findings support that this patient is homebound (i.e., Due to illness or injury, pt requires aid of supportive devices such as crutches, cane, wheelchairs, walkers, the  use of special transportation or the assistance of another person to leave their place of residence. There is a normal inability to leave the home and doing so requires considerable and taxing effort. Other absences are for medical reasons / religious services and are infrequent or of short duration when for other reasons). o If current dressing causes regression in wound condition, may D/C ordered dressing product/s and apply Normal Saline Moist Dressing daily until next Wound Healing Center / Other MD appointment. Notify Wound Healing Center of regression in wound condition at [phone]. o Please direct any NON-WOUND related issues/requests for orders to patient's Primary Care Physician Wound #2 Left Lower Leg o Continue Home Health Visits - [facility] o Home Health Nurse may visit PRN to address patientos wound care needs. o FACE TO FACE ENCOUNTER: MEDICARE and MEDICAID PATIENTS: I certify that this patient is under my care and that I had a face-to-face encounter that meets the physician face-to-face encounter requirements with this patient on this date. The encounter with the patient was in whole or in part for the following MEDICAL CONDITION: (primary reason for Home Healthcare) MEDICAL NECESSITY: I certify, that based on my findings, NURSING services are a medically necessary home health service. HOME BOUND STATUS: I certify that my clinical findings support that  this patient is homebound (i.e., Due to illness or injury, pt requires aid of supportive devices such as crutches, cane, wheelchairs, walkers, the use of special transportation or the assistance of another person to leave their place of residence. There is a normal inability to leave the home and doing so requires considerable and taxing effort. Other absences are for medical reasons / religious services and are infrequent or of short duration when for other reasons). o If current dressing causes regression in wound  condition, may D/C ordered dressing product/s and apply Normal Saline Moist Dressing daily until next Wound Healing Center / Other MD appointment. Notify Wound Healing Center of regression in wound condition at [phone]. o Please direct any NON-WOUND related issues/requests for orders to patient's Primary Care Physician Negative Pressure Wound Therapy Wound #1 Medial Sacrum o Wound VAC settings at 125/130 mmHg continuous pressure. Use BLACK/GREEN foam to wound cavity. Use WHITE foam to fill any tunnel/s and/or undermining. Change VAC dressing 3 X WEEK. Change canister as indicated when full. Nurse may titrate settings and frequency of dressing changes as clinically indicated. o Home Health Nurse may d/c VAC for s/s of increased infection, significant wound regression, or uncontrolled drainage. Notify Wound Healing Center at [phone]. - please use dakins if NPWT needs to be held Jessica [name], Jessica [name] ([id number]) Electronic Signature(s) Signed: 09/17/2017 4:22:23 PM By: Curtis Sites Signed: 09/17/2017 4:45:12 PM By: Bonnell Public Entered By: Curtis Sites on 09/17/2017 09:40:50 Jessica [name] ([id number]) -------------------------------------------------------------------------------- Problem List Details [name]. Date of Service: 09/17/2017 9:00 AM Medical Record Number: [id number] Patient Account Number: [id number] Date of Birth/Sex: June 28, 1945 (72 y.o. F) Treating RN: Curtis Sites Primary Care Provider: Annita Brod Other Clinician: Referring Provider: Annita Brod Treating Provider/Extender: Kathreen Cosier in Treatment: 16 Active Problems ICD-10 Impacting Encounter Code Description Active Date Wound Healing Diagnosis L89.154 Pressure ulcer of sacral region, stage 4 05/27/2017 No Yes M48.00 Spinal stenosis, site unspecified 05/27/2017 No Yes R54 Age-related physical debility 05/27/2017 No Yes G89.4 Chronic pain syndrome 05/27/2017 No  Yes E78.2 Mixed hyperlipidemia 05/27/2017 No Yes L89.93 Pressure ulcer of unspecified site, stage 3 05/27/2017 No Yes Inactive Problems Resolved Problems Electronic Signature(s) Signed: 09/17/2017 10:03:13 AM By: Bonnell Public Entered By: Bonnell Public on 09/17/2017 10:03:13 Jessica [name] ([id number]) -------------------------------------------------------------------------------- Progress Note Details [name]. Date of Service: 09/17/2017 9:00 AM Medical Record Number: [id number] Patient Account Number: [id number] Date of Birth/Sex: [birth year] (72 y.o. F) Treating RN: Curtis Sites Primary Care Provider: Annita Brod Other Clinician: Referring Provider: Annita Brod Treating Provider/Extender: Kathreen Cosier in Treatment: 16 Subjective Chief Complaint Information obtained from Patient she is here for evaluation of a sacral ulcer and bilateral lower extremity ulcers History of Present Illness (HPI) 05/27/17-she is here in initial evaluation for a left-sided sacral stage IV pressure ulcer and bilateral lower extremity, lateral aspect, unstageable pressure ulcers. She is accompanied by her husband and her son, who are her primary caregivers. She is bedbound secondary to spinal stenosis. According to her son and husband she was hospitalized from 10/5-11/1 with healthcare associated pneumonia and altered mental status. During her hospitalization she was intubated, extubated on 10/27. She was discharged with Foley catheter and follow up with urology. According to her son and spouse she developed the sacral ulcer during hospitalization. Home health has been applying Santyl daily. She does have a low air loss mattress and is repositioned every 2-3 hours per her family's report. According to the son and spouse she  had an appointment with urology on 12/18 and during that appointment developed discoloration to her bilateral lower extremities which ultimately  developed into unstageable pressure ulcers to the lateral aspects of her bilateral lower extremities. There has been no topical treatment applied to these. She continues to have home health. There is no concerns expressed regarding dietary intake, stating she eats 3 meals a day, eating was provided; she is supplemented with boost with protein. 06/03/17-she is here in follow-up evaluation for sacral and bilateral lower extremity ulcers. Plain film x-ray done today reveals no distraction to the sacrum or coccyx, no visible abnormalities. Home health has ordered the negative pressure wound system but it has not been initiated. We will continue with Hydrofera Blue until initiation of negative pressure wound system and continue with Santyl to bilateral lower extremity ulcers. Follow-up next week 06/10/17-she is here in follow-up evaluation for sacral and bilateral lower extremity ulcers. The wound VAC will be available tomorrow per home health. We will initiate wound VAC therapy to the sacral ulcer 3 times weekly (Thursday, Saturday/Sunday, Tuesday). We will continue with Santyl to the lower extremity ulcers. The patient's son is checking into home health therapy over the weekend for [facility] changes, with the understanding that if VAC changes cannot be performed over the weekend he will need to change his mother's appointments to Monday, Wednesday or Friday. The sacral ulcer clinically does not appear infected but there has been a change in the amount of drainage acutely, there is no significant amount of devitalized tissue, there is no malodor. Wound culture was obtained to evaluate for occult infection we will hold off on antibiotic therapy until sensitivities are resulted. 06/18/17 on evaluation today patient appears to be doing fairly well in my opinion although this is the first time I have seen this patient she has been previously evaluated by Tacey Ruiz here in our office. She is going to be switching to  Fridays to see me due to the Wound VAC schedule being changed on Monday, Wednesday, and Friday. Subsequently she seems to be doing fairly well with the Wound VAC. Her son who was present during evaluation today states that he somewhat stresses of the Wound VAC in making sure that it was functioning appropriately. With that being said everything seems to be working well he knows what settings on the [facility] itself to look at and ensure that it is functioning properly. Overall the wound appears to be nice and clean there is no need for debridement today. She has no discomfort in her bilateral lower extremity ulcers also appear to be improving based on measurements and what this honest tell me about the overall appearance. 07/02/17 on evaluation today I noted in patients wound bed that was actually an odor that had not previously been noted. Subsequently there was also a small area of bone that was not previously noted during my last evaluation. This did appear to be necrotic and was being somewhat forced out by the body around the region of granulation. She does not have any pain which is good news. Nonetheless the overall appearance of the ulcer is making me concerned for the patient having developed osteomyelitis. Currently she is not been on any antibiotics and the Wound VAC has been doing fairly well in general. With that being said I do not think we need to continue the Wound VAC if she potentially has a bone infection. At least not until it is properly addressed with antibiotics. CORTNIE, RINGEL ([id number]) 07/09/17 on evaluation  today patient appears to actually be doing very well in regard to her sacral ulcer compared to last week. There is actually no exposed bone at this point. 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 With that being  said still I do believe she needs to have a referral to infectious disease due to the osteomyelitis I am gonna recommend that she continue with the doxycycline based on the culture results which showed Eikenella Corrodens as the organism identified that tetracycline should work for. She does not seem to be having any discomfort whatsoever at this point. 07/16/17 on evaluation today patient actually appears to be doing rather well in regard to her sacral wound. I think that the original wound site actually appears much better than previously noted. With that being said she does have a new superficial injury on the right sacral region which appears to be due to according to the sign transport that occurred for her MRI unfortunately. Fortunately this is not too deep and I do think it can be managed but it was a new area that was not previously noted. Otherwise she has been tolerating the Dakin s soaked gauze packing without complication. 07/30/17 on evaluation today patient presents for reevaluation concerning her sacral wound. She has been tolerating the dressing changes without complication. With that being said her wound is doing so well at this point that I think she may be at the point where we could reinitiate the Wound VAC currently and hopefully see good results from this. I'm very pleased with how she has responded to the [facility] s soaked gauze packing. 08/13/17 evaluation today patient appears to be doing excellent in regard to her lower extremity ulcer this seems to be cleaning up very nicely. In regard to the sacral ulcer the area of trauma on the right lateral portion of the wound actually appears to be much better than previously noted during the last evaluation. The word has definitely filled in and the Wound VAC appears to be helpful I do believe. Overall I'm very pleased with how things seem to be progressing at this time. Patient likewise is also happy that things are doing well. 08/27/17 on  evaluation today patient presents for follow-up concerning her ongoing issues with her sacral ulcer and lower extremity ulcer. Fortunately she has been tolerating the dressing changes well complication the Wound VAC in general seems to have done very well up to this point. She does not have any evidence of infection which is good news. She does have some dark discoloration in central portion of the wound which is troublesome for the possibility of pressure injury to the site it sounds like her prior air mattress was not functioning properly her son has just bought a new one for her this is doing much better. Otherwise things seem to be progressing nicely. 09/10/17 on evaluation today patient presents for follow-up concerning her sacral wound and lower extremity ulcer. She has been tolerating the switch of her dressing changes to the Dakin s soaked gauze dressing very well in regard to the sacrum. The left lateral lower extremity ulcer appears to have a new area open just distal to the one that we have been treating with Santyl and this is new since her last visit. There does not appear to be any evidence of infection which is good news. With that  being said there's a lot of maceration here at the site and I feel this is likely due to the switch and the dressings it appears that due to the sticking of the dressings it will switch to utilizing a telfa pad over the Santyl. I think this caused a lot of drainage to be collected and situated in the region just under the dressing which has led to this causing which duration of breakdown. Overall there does not appear to be any significant pain which is good news. Of note I did actually have a conversation with the radiologist who is an interventional radiologist with [facility]. 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 With that being said there does not appear to be any  rush to get this done according to the interventional radiologist whom I spoke with. Therefore after discussion it was determined that there gonna wait about six months prior to considering the procedure to get time for hopefully the sacral wound to heal in the interim to at least some degree. 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 According to nursing staff these are all improved, the negative pressure wound therapy system was put on hold last week. We will resume negative pressure wound therapy today, continue with Santyl to the lower extremity and she will follow-up next week. Patient History Information obtained from Patient. Family History No family history of Cancer, Diabetes, Heart Disease. Social History Never smoker, Marital Status - Married. Jessica [name], Jessica [name] ([id number]) Objective Constitutional Vitals Time Taken: 9:14 AM, Height: 70 in, Temperature: 97.9 F, Pulse: 82 bpm, Respiratory Rate: 18 breaths/min, Blood Pressure: 128/61 mmHg. Integumentary (Hair, Skin) Wound #1 status is Open. Original cause of wound was Pressure Injury. The wound is located on the Medial Sacrum. The wound measures 8cm length x 5.4cm width x 1.9cm depth; 33.929cm^2 area and 64.465cm^3 volume. There is bone, muscle, and Fat Layer (Subcutaneous Tissue) Exposed exposed. There is undermining starting at 6:00 and ending at 2:00 with a maximum distance of 3.2cm. There is a large amount of purulent drainage noted. Foul odor after cleansing was noted. The wound margin is distinct with the outline attached to the wound base. There is medium (34-66%) red, pink, hyper - granulation within the wound bed. There is a medium (34-66%) amount of necrotic tissue within the wound bed including Eschar and Adherent Slough. The periwound skin appearance exhibited: Induration, Scarring, Rubor, Erythema. The periwound skin appearance did not exhibit: Callus, Crepitus,  Excoriation, Rash, Dry/Scaly, Maceration, Atrophie Blanche, Cyanosis, Ecchymosis, Hemosiderin Staining, Mottled, Pallor. The surrounding wound skin color is noted with erythema which is circumferential. Periwound temperature was noted as No Abnormality. The periwound has tenderness on palpation. Wound #2 status is Open. Original cause of wound was Gradually Appeared. The wound is located on the Left Lower Leg. The wound measures 1.5cm length x 1.5cm width x 0.3cm depth; 1.767cm^2 area and 0.53cm^3 volume. Wound #4 status is Healed - Epithelialized. Original cause of wound was Pressure Injury. The wound is located on the Left,Distal Lower Leg. The wound measures 0cm length x 0cm width x 0cm depth; 0cm^2 area and 0cm^3 volume. Assessment Active Problems ICD-10 Pressure ulcer of sacral region, stage 4 Spinal stenosis, site unspecified Age-related physical debility Chronic pain syndrome Mixed hyperlipidemia Pressure ulcer of unspecified site, stage 3 Plan Wound Cleansing: Wound #1 Medial Sacrum: Jessica [name] ([id number]) Other: - please cleanse  sacral wound with dakins moistened gauze - do not spray dakins on wound Wound #2 Left Lower Leg: Clean wound with Normal Saline. Anesthetic (add to Medication List): Wound #1 Medial Sacrum: Topical Lidocaine 4% cream applied to wound bed prior to debridement (In Clinic Only). Wound #2 Left Lower Leg: Topical Lidocaine 4% cream applied to wound bed prior to debridement (In Clinic Only). Primary Wound Dressing: Wound #1 Medial Sacrum: Silver Alginate - to ridge around wound Wound #2 Left Lower Leg: Mepitel One Contact layer Santyl Ointment - PLACE ON OPEN WOUND AREAS ON LEFT LATERAL LOWER LEG Secondary Dressing: Wound #2 Left Lower Leg: Boardered Foam Dressing - or ABD pad and tape Dressing Change Frequency: Wound #1 Medial Sacrum: Change Dressing Monday, Wednesday, Friday Wound #2 Left Lower Leg: Change dressing every day. Follow-up  Appointments: Wound #1 Medial Sacrum: Return Appointment in 1 week. Wound #2 Left Lower Leg: Return Appointment in 1 week. Off-Loading: Wound #1 Medial Sacrum: Turn and reposition every 2 hours Wound #2 Left Lower Leg: Turn and reposition every 2 hours Additional Orders / Instructions: Wound #1 Medial Sacrum: Increase protein intake. Wound #2 Left Lower Leg: Increase protein intake. Home Health: Wound #1 Medial Sacrum: Continue Home Health Visits - [facility] Nurse may visit PRN to address patient s wound care needs. FACE TO FACE ENCOUNTER: MEDICARE and MEDICAID PATIENTS: I certify that this patient is under my care and that I had a face-to-face encounter that meets the physician face-to-face encounter requirements with this patient on this date. The encounter with the patient was in whole or in part for the following MEDICAL CONDITION: (primary reason for Home Healthcare) MEDICAL NECESSITY: I certify, that based on my findings, NURSING services are a medically necessary home health service. HOME BOUND STATUS: I certify that my clinical findings support that this patient is homebound (i.e., Due to illness or injury, pt requires aid of supportive devices such as crutches, cane, wheelchairs, walkers, the use of special transportation or the assistance of another person to leave their place of residence. There is a normal inability to leave the home and doing so requires considerable and taxing effort. Other absences are for medical reasons / religious services and are infrequent or of short duration when for other reasons). If current dressing causes regression in wound condition, may D/C ordered dressing product/s and apply Normal Saline Moist Dressing daily until next Wound Healing Center / Other MD appointment. Notify Wound Healing Center of regression in wound condition at [phone]. Please direct any NON-WOUND related issues/requests for orders to patient's Primary Care  Physician Wound #2 Left Lower Leg: Continue Home Health Visits - [facility] Nurse may visit PRN to address patient s wound care needs. FACE TO FACE ENCOUNTER: MEDICARE and MEDICAID PATIENTS: I certify that this patient is under my care and that I had a face-to-face encounter that meets the physician face-to-face encounter requirements with this patient on this date. The encounter with the patient was in whole or in part for the following MEDICAL CONDITION: (primary reason for Home Jessica [name], Jessica [name]. ([id number]) Healthcare) MEDICAL NECESSITY: I certify, that based on my findings, NURSING services are a medically necessary home health service. HOME BOUND STATUS: I certify that my clinical findings support that this patient is homebound (i.e., Due to illness or injury, pt requires aid of supportive devices such as crutches, cane, wheelchairs, walkers, the use of special transportation or the assistance of another person to leave their place of residence. There is a  normal inability to leave the home and doing so requires considerable and taxing effort. Other absences are for medical reasons / religious services and are infrequent or of short duration when for other reasons). If current dressing causes regression in wound condition, may D/C ordered dressing product/s and apply Normal Saline Moist Dressing daily until next Wound Healing Center / Other MD appointment. Notify Wound Healing Center of regression in wound condition at [phone]. Please direct any NON-WOUND related issues/requests for orders to patient's Primary Care Physician Negative Pressure Wound Therapy: Wound #1 Medial Sacrum: Wound VAC settings at 125/130 mmHg continuous pressure. Use BLACK/GREEN foam to wound cavity. Use WHITE foam to fill any tunnel/s and/or undermining. Change VAC dressing 3 X WEEK. Change canister as indicated when full. Nurse may titrate settings and frequency of dressing changes as clinically  indicated. Home Health Nurse may d/c VAC for s/s of increased infection, significant wound regression, or uncontrolled drainage. Notify Wound Healing Center at [phone]. - please use dakins if NPWT needs to be held Electronic Signature(s) Signed: 09/17/2017 10:06:50 AM By: Bonnell Public Entered By: Bonnell Public on 09/17/2017 10:06:50 Jessica [name] ([id number]) -------------------------------------------------------------------------------- ROS/PFSH Details [name]. Date of Service: 09/17/2017 9:00 AM Medical Record Number: [id number] Patient Account Number: [id number] Date of Birth/Sex: 1946/01/11 (72 y.o. F) Treating RN: Curtis Sites Primary Care Provider: Annita Brod Other Clinician: Referring Provider: Annita Brod Treating Provider/Extender: Kathreen Cosier in Treatment: 16 Information Obtained From Patient Wound History Immunizations Pneumococcal Vaccine: Received Pneumococcal Vaccination: No Implantable Devices Family and Social History Cancer: No; Diabetes: No; Heart Disease: No; Never smoker; Marital Status - Married Physician Affirmation I have reviewed and agree with the above information. Electronic Signature(s) Signed: 09/17/2017 4:22:23 PM By: Curtis Sites Signed: 09/17/2017 4:45:12 PM By: Bonnell Public Entered By: Bonnell Public on 09/17/2017 10:06:38 Jessica [name] ([id number]) -------------------------------------------------------------------------------- SuperBill Details [name]. Date of Service: 09/17/2017 Medical Record Number: [id number] Patient Account Number: [id number] Date of Birth/Sex: 1945-07-12 (72 y.o. F) Treating RN: Curtis Sites Primary Care Provider: Annita Brod Other Clinician: Referring Provider: Annita Brod Treating Provider/Extender: Kathreen Cosier in Treatment: 16 Diagnosis Coding ICD-10 Codes Code Description L89.154 Pressure ulcer of sacral region, stage  4 M48.00 Spinal stenosis, site unspecified R54 Age-related physical debility G89.4 Chronic pain syndrome E78.2 Mixed hyperlipidemia L89.93 Pressure ulcer of unspecified site, stage 3 Facility Procedures CPT4 Code: [id number] Description: 97607 NEG PRESS WND TX <=50 SQ CM Modifier: Quantity: 1 Physician Procedures CPT4 Code: 4259563 Description: 99213 - WC PHYS LEVEL 3 - EST PT ICD-10 Diagnosis Description L89.154 Pressure ulcer of sacral region, stage 4 L89.93 Pressure ulcer of unspecified site, stage 3 Modifier: Quantity: 1 Electronic Signature(s) Signed: 09/17/2017 10:07:23 AM By: Bonnell Public Previous Signature: 09/17/2017 9:45:43 AM Version By: Curtis Sites Entered By: Bonnell Public on 09/17/2017 10:07:23

## 2017-09-23 ENCOUNTER — Telehealth: Payer: Self-pay | Admitting: Radiology

## 2017-09-23 NOTE — Telephone Encounter (Signed)
Son asks what is the purpose of upcoming visit with Dr Apolinar JunesBrandon. He doesn't feel that it is in the patient's best interest due to need for EMS for transportation & risk of further injury to sacral wound unless there needs to be testing or other intervention. Please advise.

## 2017-09-23 NOTE — Telephone Encounter (Signed)
That is fine.  It was primarily to check in on bladder management, discuss alternatives to an indwelling Foley, assess her stone burden, and review plans for treatment of her renal mass.  If they think she is doing well, no need to come.  Vanna ScotlandAshley Aaralynn Shepheard, MD

## 2017-09-23 NOTE — Telephone Encounter (Signed)
Spoke with son regarding Dr Delana MeyerBrandon's note below. Son states patient is "doing great" & is having no problems with the Foley, urine looks good per home health nurse & is eating and sleeping well. Advised that patient will need to return to clinic after the procedure in Interventional Radiology or sooner if needed. Son voices understanding.

## 2017-09-23 NOTE — Progress Notes (Signed)
Jessica [name], Jessica [name] ([id number]) Visit Report for 09/17/2017 Arrival Information Details [name], Jessica [name]. Date of Service: 09/17/2017 9:00 AM Medical Record Number: [id number] Patient Account Number: [id number] Date of Birth/Sex: 20-Jun-1945 (72 y.o. F) Treating RN: Huel Coventry Primary Care Lorry Anastasi: Annita Brod Other Clinician: Referring Dorlene Footman: Annita Brod Treating Armoni Depass/Extender: Kathreen Cosier in Treatment: 16 Visit Information History Since Last Visit Added or deleted any medications: No Patient Arrived: Stretcher Any new allergies or adverse reactions: No Arrival Time: 09:14 Had a fall or experienced change in No Accompanied By: son activities of daily living that may affect Transfer Assistance: Stretcher risk of falls: Patient Identification Verified: Yes Signs or symptoms of abuse/neglect since last visito No Secondary Verification Process Completed: Yes Hospitalized since last visit: No Patient Has Alerts: Yes Implantable device outside of the clinic excluding No Patient Alerts: allergic to zinc cellular tissue based products placed in the center since last visit: Has Dressing in Place as Prescribed: Yes Pain Present Now: No Electronic Signature(s) Signed: 09/21/2017 3:21:59 PM By: Elliot Gurney, BSN, RN, CWS, Kim RN, BSN Entered By: Elliot Gurney, BSN, RN, CWS, Kim on 09/17/2017 09:14:30 Jessica [name] ([id number]) -------------------------------------------------------------------------------- Encounter Discharge Information Details [name]. Date of Service: 09/17/2017 9:00 AM Medical Record Number: [id number] Patient Account Number: [id number] Date of Birth/Sex: 1945/12/20 (72 y.o. F) Treating RN: Phillis Haggis Primary Care Neleh Muldoon: Annita Brod Other Clinician: Referring Marlynn Hinckley: Annita Brod Treating Ascencion Coye/Extender: Kathreen Cosier in Treatment: 16 Encounter Discharge Information Items Discharge Condition:  Stable Ambulatory Status: Stretcher Discharge Destination: Home Transportation: Ambulance Accompanied By: son Schedule Follow-up Appointment: Yes Clinical Summary of Care: Electronic Signature(s) Signed: 09/17/2017 11:49:02 AM By: Alejandro Mulling Entered By: Alejandro Mulling on 09/17/2017 10:34:31 Jessica [name] ([id number]) -------------------------------------------------------------------------------- Lower Extremity Assessment Details [name]. Date of Service: 09/17/2017 9:00 AM Medical Record Number: [id number] Patient Account Number: [id number] Date of Birth/Sex: [birth year] (72 y.o. F) Treating RN: Huel Coventry Primary Care Eilyn Polack: Annita Brod Other Clinician: Referring Harlym Gehling: Annita Brod Treating Laylana Gerwig/Extender: Kathreen Cosier in Treatment: 16 Vascular Assessment Pulses: Dorsalis Pedis Palpable: [Left:Yes] [Right:Yes] Posterior Tibial Extremity colors, hair growth, and conditions: Extremity Color: [Left:Normal] [Right:Normal] Hair Growth on Extremity: [Left:No] [Right:No] Temperature of Extremity: [Left:Warm] [Right:Warm] Capillary Refill: [Left:< 3 seconds] [Right:< 3 seconds] Toe Nail Assessment Left: Right: Thick: Yes Yes Discolored: Yes Yes Deformed: Yes Yes Improper Length and Hygiene: Yes Yes Electronic Signature(s) Signed: 09/21/2017 3:21:59 PM By: Elliot Gurney, BSN, RN, CWS, Kim RN, BSN Entered By: Elliot Gurney, BSN, RN, CWS, Kim on 09/17/2017 09:26:39 Jessica [name] ([id number]) -------------------------------------------------------------------------------- Multi Wound Chart Details [name]. Date of Service: 09/17/2017 9:00 AM Medical Record Number: [id number] Patient Account Number: [id number] Date of Birth/Sex: 1945-12-14 (72 y.o. F) Treating RN: Curtis Sites Primary Care Elim Economou: Annita Brod Other Clinician: Referring Myndi Wamble: Annita Brod Treating Angelize Ryce/Extender: Kathreen Cosier  in Treatment: 16 Vital Signs Height(in): 70 Pulse(bpm): 82 Weight(lbs): Blood Pressure(mmHg): 128/61 Body Mass Index(BMI): Temperature(F): 97.9 Respiratory Rate 18 (breaths/min): Photos: [1:No Photos] [2:No Photos] [4:No Photos] Wound Location: [1:Sacrum - Medial] [2:Left Lower Leg] [4:Left, Distal Lower Leg] Wounding Event: [1:Pressure Injury] [2:Gradually Appeared] [4:Pressure Injury] Primary Etiology: [1:Pressure Ulcer] [2:Pressure Ulcer] [4:Pressure Ulcer] Date Acquired: [1:01/15/2017] [2:04/28/2017] [4:09/10/2017] Weeks of Treatment: [1:16] [2:16] [4:1] Wound Status: [1:Open] [2:Open] [4:Healed - Epithelialized] Measurements L x W x D [1:8x5.4x1.9] [2:1.5x1.5x0.3] [4:0x0x0] (cm) Area (cm) : [1:33.929] [2:1.767] [4:0] Volume (cm) : [1:64.465] [2:0.53] [4:0] % Reduction in Area: [1:7.90%] [2:-275.20%] [4:100.00%] % Reduction in  Volume: [1:-74.90%] [2:-463.80%] [4:100.00%] Starting Position 1 [1:6] (o'clock): Ending Position 1 [1:2] (o'clock): Maximum Distance 1 (cm): [1:3.2] Undermining: [1:Yes] [2:N/A] [4:N/A] Classification: [1:Category/Stage IV] [2:Category/Stage III] [4:Category/Stage II] Exudate Amount: [1:Large] [2:N/A] [4:N/A] Exudate Type: [1:Purulent] [2:N/A] [4:N/A] Exudate Color: [1:yellow, brown, green] [2:N/A] [4:N/A] Foul Odor After Cleansing: [1:Yes] [2:N/A] [4:N/A] Odor Anticipated Due to [1:No] [2:N/A] [4:N/A] Product Use: Wound Margin: [1:Distinct, outline attached] [2:N/A] [4:N/A] Granulation Amount: [1:Medium (34-66%)] [2:N/A] [4:N/A] Granulation Quality: [1:Red, Pink, Hyper-granulation] [2:N/A] [4:N/A] Necrotic Amount: [1:Medium (34-66%)] [2:N/A] [4:N/A] Necrotic Tissue: [1:Eschar, Adherent Slough] [2:N/A] [4:N/A] Exposed Structures: [1:Fat Layer (Subcutaneous Tissue) Exposed: Yes Muscle: Yes Bone: Yes Fascia: No] [2:N/A] [4:N/A] Tendon: No Joint: No Epithelialization: None N/A N/A Periwound Skin Texture: Induration: Yes No Abnormalities  Noted No Abnormalities Noted Scarring: Yes Excoriation: No Callus: No Crepitus: No Rash: No Periwound Skin Moisture: Maceration: No No Abnormalities Noted No Abnormalities Noted Dry/Scaly: No Periwound Skin Color: Erythema: Yes No Abnormalities Noted No Abnormalities Noted Rubor: Yes Atrophie Blanche: No Cyanosis: No Ecchymosis: No Hemosiderin Staining: No Mottled: No Pallor: No Erythema Location: Circumferential N/A N/A Temperature: No Abnormality N/A N/A Tenderness on Palpation: Yes No No Wound Preparation: Ulcer Cleansing: N/A N/A Rinsed/Irrigated with Saline Topical Anesthetic Applied: Other: lidocaine 4% Treatment Notes Electronic Signature(s) Signed: 09/17/2017 10:03:28 AM By: Bonnell Publicoulter, Leah Entered By: Bonnell Publicoulter, Leah on 09/17/2017 10:03:27 Jessica [name], [name] ([id number]) -------------------------------------------------------------------------------- Multi-Disciplinary Care Plan Details [name], [name] Date of Service: 09/17/2017 9:00 AM Medical Record Number: [id number] Patient Account Number: [id number] Date of Birth/Sex: Feb 04, 1946 (72 y.o. F) Treating RN: Curtis Sitesorthy, Joanna Primary Care Gyselle Matthew: Annita BrodASENSO, PHILIP Other Clinician: Referring Clarabelle Oscarson: Annita BrodASENSO, PHILIP Treating Lynk Marti/Extender: Kathreen Cosieroulter, Leah Weeks in Treatment: 16 Active Inactive ` Orientation to the Wound Care Program Nursing Diagnoses: Knowledge deficit related to the wound healing center program Goals: Patient/caregiver will verbalize understanding of the Wound Healing Center Program Date Initiated: 06/03/2017 Target Resolution Date: 06/17/2017 Goal Status: Active Interventions: Provide education on orientation to the wound center Notes: ` Pressure Nursing Diagnoses: Knowledge deficit related to causes and risk factors for pressure ulcer development Knowledge deficit related to management of pressures ulcers Potential for impaired tissue integrity related to pressure, friction,  moisture, and shear Goals: Patient will remain free from development of additional pressure ulcers Date Initiated: 06/03/2017 Target Resolution Date: 06/17/2017 Goal Status: Active Patient will remain free of pressure ulcers Date Initiated: 06/03/2017 Target Resolution Date: 06/17/2017 Goal Status: Active Patient/caregiver will verbalize risk factors for pressure ulcer development Date Initiated: 06/03/2017 Target Resolution Date: 06/17/2017 Goal Status: Active Interventions: Assess: immobility, friction, shearing, incontinence upon admission and as needed Assess potential for pressure ulcer upon admission and as needed Notes: ` Wound/Skin Impairment Jessica [name], [name] ([id number]) Nursing Diagnoses: Impaired tissue integrity Goals: Patient/caregiver will verbalize understanding of skin care regimen Date Initiated: 06/03/2017 Target Resolution Date: 06/17/2017 Goal Status: Active Ulcer/skin breakdown will have a volume reduction of 30% by week 4 Date Initiated: 06/03/2017 Target Resolution Date: 06/17/2017 Goal Status: Active Interventions: Assess ulceration(s) every visit Treatment Activities: Patient referred to home care : 06/03/2017 Skin care regimen initiated : 06/03/2017 Notes: Electronic Signature(s) Signed: 09/17/2017 4:22:23 PM By: Curtis Sitesorthy, Joanna Entered By: Curtis Sitesorthy, Joanna on 09/17/2017 09:36:59 Jessica [name], [name] ([id number]) -------------------------------------------------------------------------------- Pain Assessment Details [name], [name] Date of Service: 09/17/2017 9:00 AM Medical Record Number: [id number] Patient Account Number: [id number] Date of Birth/Sex: Feb 04, 1946 (72 y.o. F) Treating RN: Huel CoventryWoody, Kim Primary Care Samon Dishner: Annita BrodASENSO, PHILIP Other Clinician: Referring Lavontae Cornia: Annita BrodASENSO, PHILIP Treating Ahmari Duerson/Extender: Kathreen Cosieroulter, Leah Weeks  in Treatment: 16 Active Problems Location of Pain Severity and Description of Pain Patient Has Paino No Site  Locations Pain Management and Medication Current Pain Management: Electronic Signature(s) Signed: 09/21/2017 3:21:59 PM By: Elliot Gurney, BSN, RN, CWS, Kim RN, BSN Entered By: Elliot Gurney, BSN, RN, CWS, Kim on 09/17/2017 09:14:47 Jessica [name] ([id number]) -------------------------------------------------------------------------------- Patient/Caregiver Education Details [name]. Date of Service: 09/17/2017 9:00 AM Medical Record Number: [id number] Patient Account Number: [id number] Date of Birth/Gender: 12/20/1945 (72 y.o. F) Treating RN: Phillis Haggis Primary Care Physician: Annita Brod Other Clinician: Referring Physician: Annita Brod Treating Physician/Extender: Kathreen Cosier in Treatment: 16 Education Assessment Education Provided To: Patient Education Topics Provided Wound/Skin Impairment: Handouts: Caring for Your Ulcer, Other: change dressing as ordered Methods: Demonstration, Explain/Verbal Responses: State content correctly Electronic Signature(s) Signed: 09/17/2017 11:49:02 AM By: Alejandro Mulling Entered By: Alejandro Mulling on 09/17/2017 10:34:52 Jessica [name] ([id number]) -------------------------------------------------------------------------------- Wound Assessment Details [name]. Date of Service: 09/17/2017 9:00 AM Medical Record Number: [id number] Patient Account Number: [id number] Date of Birth/Sex: [birth year] (72 y.o. F) Treating RN: Huel Coventry Primary Care Raeqwon Lux: Annita Brod Other Clinician: Referring Zyon Rosser: Annita Brod Treating Elmer Merwin/Extender: Kathreen Cosier in Treatment: 16 Wound Status Wound Number: 1 Primary Etiology: Pressure Ulcer Wound Location: Sacrum - Medial Wound Status: Open Wounding Event: Pressure Injury Date Acquired: 01/15/2017 Weeks Of Treatment: 16 Clustered Wound: No Photos Photo Uploaded By: Elliot Gurney, BSN, RN, CWS, Kim on 09/17/2017 15:12:11 Wound  Measurements Length: (cm) 8 % Reducti Width: (cm) 5.4 % Reducti Depth: (cm) 1.9 Epithelia Area: (cm) 33.929 Undermin Volume: (cm) 64.465 Start Ending Maximu on in Area: 7.9% on in Volume: -74.9% lization: None ing: Yes ing Position (o'clock): 6 Position (o'clock): 2 m Distance: (cm) 3.2 Wound Description Classification: Category/Stage IV Foul Odor Wound Margin: Distinct, outline attached Due to Pr Exudate Amount: Large Slough/Fi Exudate Type: Purulent Exudate Color: yellow, brown, green After Cleansing: Yes oduct Use: No brino Yes Wound Bed Granulation Amount: Medium (34-66%) Exposed Structure Granulation Quality: Red, Pink, Hyper-granulation Fascia Exposed: No Necrotic Amount: Medium (34-66%) Fat Layer (Subcutaneous Tissue) Exposed: Yes Necrotic Quality: Eschar, Adherent Slough Tendon Exposed: No Muscle Exposed: Yes Necrosis of Muscle: No Jessica [name], Jessica [name]. ([id number]) Joint Exposed: No Bone Exposed: Yes Periwound Skin Texture Texture Color No Abnormalities Noted: No No Abnormalities Noted: No Callus: No Atrophie Blanche: No Crepitus: No Cyanosis: No Excoriation: No Ecchymosis: No Induration: Yes Erythema: Yes Rash: No Erythema Location: Circumferential Scarring: Yes Hemosiderin Staining: No Mottled: No Moisture Pallor: No No Abnormalities Noted: No Rubor: Yes Dry / Scaly: No Maceration: No Temperature / Pain Temperature: No Abnormality Tenderness on Palpation: Yes Wound Preparation Ulcer Cleansing: Rinsed/Irrigated with Saline Topical Anesthetic Applied: Other: lidocaine 4%, Treatment Notes Wound #1 (Medial Sacrum) 1. Cleansed with: Clean wound with Normal Saline 2. Anesthetic Topical Lidocaine 4% cream to wound bed prior to debridement 4. Dressing Applied: Other dressing (specify in notes) 7. Secured with Tape 8. Negative Pressure Wound Therapy Wound Vac to wound continuously at 156mm/hg pressure Black Foam Number of foam/gauze  pieces used in the dressing = Change canister as needed. Notes silvercel, 3 pieces of black foam Electronic Signature(s) Signed: 09/21/2017 3:21:59 PM By: Elliot Gurney, BSN, RN, CWS, Kim RN, BSN Entered By: Elliot Gurney, BSN, RN, CWS, Kim on 09/17/2017 09:25:33 Jessica [name] ([id number]) -------------------------------------------------------------------------------- Wound Assessment Details [name]. Date of Service: 09/17/2017 9:00 AM Medical Record Number: [id number] Patient Account Number: [id number] Date of Birth/Sex: 02/09/1946 (72 y.o. F)  Treating RN: Huel Coventry Primary Care Quay Simkin: Annita Brod Other Clinician: Referring Ashish Rossetti: Annita Brod Treating Cullan Launer/Extender: Kathreen Cosier in Treatment: 16 Wound Status Wound Number: 2 Primary Etiology: Pressure Ulcer Wound Location: Left Lower Leg Wound Status: Open Wounding Event: Gradually Appeared Date Acquired: 04/28/2017 Weeks Of Treatment: 16 Clustered Wound: No Photos Photo Uploaded By: Elliot Gurney, BSN, RN, CWS, Kim on 09/17/2017 15:12:11 Wound Measurements Length: (cm) 1.5 Width: (cm) 1.5 Depth: (cm) 0.3 Area: (cm) 1.767 Volume: (cm) 0.53 % Reduction in Area: -275.2% % Reduction in Volume: -463.8% Wound Description Classification: Category/Stage III Periwound Skin Texture Texture Color No Abnormalities Noted: No No Abnormalities Noted: No Moisture No Abnormalities Noted: No Treatment Notes Wound #2 (Left Lower Leg) 1. Cleansed with: Clean wound with Normal Saline 2. Anesthetic Topical Lidocaine 4% cream to wound bed prior to debridement 4. Dressing Applied: MARKESIA, CRILLY ([id number]) Santyl Ointment 5. Secondary Dressing Applied ABD Pad 7. Secured with Secretary/administrator) Signed: 09/21/2017 3:21:59 PM By: Elliot Gurney, BSN, RN, CWS, Kim RN, BSN Entered By: Elliot Gurney, BSN, RN, CWS, Kim on 09/17/2017 78:29:56 Jessica [name]  ([id number]) -------------------------------------------------------------------------------- Wound Assessment Details [name]. Date of Service: 09/17/2017 9:00 AM Medical Record Number: [id number] Patient Account Number: [id number] Date of Birth/Sex: 1945/09/15 (72 y.o. F) Treating RN: Huel Coventry Primary Care Zaide Mcclenahan: Annita Brod Other Clinician: Referring Lyell Clugston: Annita Brod Treating Rubee Vega/Extender: Kathreen Cosier in Treatment: 16 Wound Status Wound Number: 4 Primary Etiology: Pressure Ulcer Wound Location: Left, Distal Lower Leg Wound Status: Healed - Epithelialized Wounding Event: Pressure Injury Date Acquired: 09/10/2017 Weeks Of Treatment: 1 Clustered Wound: No Photos Photo Uploaded By: Elliot Gurney, BSN, RN, CWS, Kim on 09/17/2017 15:14:48 Wound Measurements Length: (cm) 0 Width: (cm) 0 Depth: (cm) 0 Area: (cm) 0 Volume: (cm) 0 % Reduction in Area: 100% % Reduction in Volume: 100% Wound Description Classification: Category/Stage II Periwound Skin Texture Texture Color No Abnormalities Noted: No No Abnormalities Noted: No Moisture No Abnormalities Noted: No Electronic Signature(s) Signed: 09/21/2017 3:21:59 PM By: Elliot Gurney, BSN, RN, CWS, Kim RN, BSN Entered By: Elliot Gurney, BSN, RN, CWS, Kim on 09/17/2017 09:21:03 Jessica [name] ([id number]) -------------------------------------------------------------------------------- Vitals Details [name]. Date of Service: 09/17/2017 9:00 AM Medical Record Number: [id number] Patient Account Number: [id number] Date of Birth/Sex: 10-Feb-1946 (72 y.o. F) Treating RN: Huel Coventry Primary Care Rustyn Conery: Annita Brod Other Clinician: Referring Menaal Russum: Annita Brod Treating Leeloo Silverthorne/Extender: Kathreen Cosier in Treatment: 16 Vital Signs Time Taken: 09:14 Temperature (F): 97.9 Height (in): 70 Pulse (bpm): 82 Respiratory Rate (breaths/min): 18 Blood Pressure  (mmHg): 128/61 Reference Range: 80 - 120 mg / dl Electronic Signature(s) Signed: 09/21/2017 3:21:59 PM By: Elliot Gurney, BSN, RN, CWS, Kim RN, BSN Entered By: Elliot Gurney, BSN, RN, CWS, Kim on 09/17/2017 09:16:44

## 2017-09-24 ENCOUNTER — Other Ambulatory Visit
Admission: RE | Admit: 2017-09-24 | Discharge: 2017-09-24 | Disposition: A | Discharge status: Home / Self Care | Payer: Medicare Other | Source: Ambulatory Visit | Attending: Physician Assistant | Admitting: Physician Assistant

## 2017-09-24 ENCOUNTER — Encounter: Payer: Medicare Other | Admitting: Physician Assistant

## 2017-09-24 DIAGNOSIS — L89154 Pressure ulcer of sacral region, stage 4: Secondary | ICD-10-CM | POA: Diagnosis not present

## 2017-09-24 DIAGNOSIS — B999 Unspecified infectious disease: Secondary | ICD-10-CM | POA: Diagnosis present

## 2017-09-27 LAB — AEROBIC CULTURE W GRAM STAIN (SUPERFICIAL SPECIMEN)

## 2017-09-27 LAB — AEROBIC CULTURE  (SUPERFICIAL SPECIMEN)

## 2017-09-28 ENCOUNTER — Ambulatory Visit: Payer: Self-pay | Admitting: Urology

## 2017-09-28 NOTE — Progress Notes (Addendum)
Jessica [name], Jessica [name] ([id number]) Visit Report for 09/24/2017 Chief Complaint Document Details [name], Jessica [name]. Date of Service: 09/24/2017 10:30 AM Medical Record Number: [id number] Patient Account Number: [id number] Date of Birth/Sex: 16-Nov-1945 (72 y.o. F) Treating RN: Primary Care Provider: Annita Brod Other Clinician: Referring Provider: Annita Brod Treating Provider/Extender: STONE III, Nilo Fallin Weeks in Treatment: 17 Information Obtained from: Patient Chief Complaint she is here for evaluation of Jessica sacral ulcer and bilateral lower extremity ulcers Electronic Signature(s) Signed: 09/26/2017 11:18:14 PM By: Lenda Kelp PA-C Entered By: Lenda Kelp on 09/24/2017 10:24:18 Jessica [name] ([id number]) -------------------------------------------------------------------------------- Debridement Details [name]. Date of Service: 09/24/2017 10:30 AM Medical Record Number: [id number] Patient Account Number: [id number] Date of Birth/Sex: 1945/06/09 (72 y.o. F) Treating RN: Curtis Sites Primary Care Provider: Annita Brod Other Clinician: Referring Provider: Annita Brod Treating Provider/Extender: STONE III, Stefen Juba Weeks in Treatment: 17 Debridement Performed for Wound #1 Medial Sacrum Assessment: Performed By: Physician STONE III, Rashae Rother E., PA-C Debridement Type: Debridement Pre-procedure Verification/Time Yes - 11:28 Out Taken: Start Time: 11:28 Pain Control: Lidocaine 4% Topical Solution Total Area Debrided (L x W): 4.5 (cm) x 2.5 (cm) = 11.25 (cm) Tissue and other material Viable, Non-Viable, Bone, Slough, Subcutaneous, Fibrin/Exudate, Slough debrided: Level: Skin/Subcutaneous Tissue/Muscle/Bone Debridement Description: Excisional Instrument: Curette, Forceps Specimen: Tissue Culture Number of Specimens Taken: 4 Bleeding: Minimum Hemostasis Achieved: Pressure End Time: 11:34 Procedural Pain: 0 Post Procedural Pain:  0 Response to Treatment: Procedure was tolerated well Level of Consciousness: Awake and Alert Post Procedure Vitals: Temperature: 98.5 Pulse: 89 Respiratory Rate: 16 Blood Pressure: Systolic Blood Pressure: 152 Diastolic Blood Pressure: 70 Post Debridement Measurements of Total Wound Length: (cm) 8 Stage: Category/Stage IV Width: (cm) 5.5 Depth: (cm) 2.3 Volume: (cm) 79.482 Character of Wound/Ulcer Post Improved Debridement: Post Procedure Diagnosis Same as Pre-procedure Electronic Signature(s) Signed: 09/24/2017 4:47:18 PM By: Curtis Sites Signed: 09/26/2017 11:18:14 PM By: Lavella Lemons, Mellody Memos ([id number]) Entered By: Curtis Sites on 09/24/2017 11:36:14 Jessica [name] ([id number]) -------------------------------------------------------------------------------- HPI Details [name]. Date of Service: 09/24/2017 10:30 AM Medical Record Number: [id number] Patient Account Number: [id number] Date of Birth/Sex: [birth year] (72 y.o. 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 She is accompanied by her husband and her son, who are her primary caregivers. She is bedbound secondary to spinal stenosis. According to her son and husband she was hospitalized from 10/5-11/1 with healthcare associated pneumonia and altered mental status. During her hospitalization she was intubated, extubated on 10/27. She was discharged with Foley catheter and follow up with urology. According to her son and spouse she developed the sacral ulcer during hospitalization. Home health has been applying Santyl daily. She does have  Jessica low air loss mattress and is repositioned every 2-3 hours per her family's report. According to the son and spouse she had an appointment with urology on 12/18 and during that appointment developed discoloration to her bilateral lower extremities which ultimately developed into unstageable pressure ulcers to the lateral aspects of her bilateral lower extremities. There has been no topical treatment applied to these. She continues to have home health. There is no concerns expressed regarding dietary intake, stating she eats 3 meals Jessica day, eating  was provided; she is supplemented with boost with protein. 06/03/17-she is here in follow-up evaluation for sacral and bilateral lower extremity ulcers. Plain film x-ray done today reveals no distraction to the sacrum or coccyx, no visible abnormalities. Home health has ordered the negative pressure wound system but it has not been initiated. We will continue with Hydrofera Blue until initiation of negative pressure wound system and continue with Santyl to bilateral lower extremity ulcers. Follow-up next week 06/10/17-she is here in follow-up evaluation for sacral and bilateral lower extremity ulcers. The wound VAC will be available tomorrow per home health. We will initiate wound VAC therapy to the sacral ulcer 3 times weekly (Thursday, Saturday/Sunday, Tuesday). We will continue with Santyl to the lower extremity ulcers. The patient's son is checking into home health therapy over the weekend for [facility] changes, with the understanding that if VAC changes cannot be performed over the weekend he will need to change his mother's appointments to Monday, Wednesday or Friday. The sacral ulcer clinically does not appear infected but there has been Jessica change in the amount of drainage acutely, there is no significant amount of devitalized tissue, there is no malodor. Wound culture was obtained to evaluate for occult infection we will hold off on antibiotic therapy until  sensitivities are resulted. 06/18/17 on evaluation today patient appears to be doing fairly well in my opinion although this is the first time I have seen this patient she has been previously evaluated by Tacey Ruiz here in our office. She is going to be switching to Fridays to see me due to the Wound VAC schedule being changed on Monday, Wednesday, and Friday. Subsequently she seems to be doing fairly well with the Wound VAC. Her son who was present during evaluation today states that he somewhat stresses of the Wound VAC in making sure that it was functioning appropriately. With that being said everything seems to be working well he knows what settings on the [facility] itself to look at and ensure that it is functioning properly. Overall the wound appears to be nice and clean there is no need for debridement today. She has no discomfort in her bilateral lower extremity ulcers also appear to be improving based on measurements and what this honest tell me about the overall appearance. 07/02/17 on evaluation today I noted in patients wound bed that was actually an odor that had not previously been noted. Subsequently there was also Jessica small area of bone that was not previously noted during my last evaluation. This did appear to be necrotic and was being somewhat forced out by the body around the region of granulation. She does not have any pain which is good news. Nonetheless the overall appearance of the ulcer is making me concerned for the patient having developed osteomyelitis. Currently she is not been on any antibiotics and the Wound VAC has been doing fairly well in general. With that being said I do not think we need to continue the Wound VAC if she potentially has Jessica bone infection. At least not until it is properly addressed with antibiotics. 07/09/17 on evaluation today patient appears to actually be doing very well in regard to her sacral ulcer compared to last week. There is actually no exposed bone at  this point. Her pathology report showed early signs of osteomyelitis which had explained to the patient son is definitely good news catching this early is often Jessica key to getting it better without things worsening. With that being said still I do believe  she needs to have Jessica referral to infectious disease due to the osteomyelitis I am gonna recommend that she continue with the doxycycline based on the culture results which showed Eikenella Corrodens as the Jessica [name], Jessica [name]. ([id number]) organism identified that tetracycline should work for. She does not seem to be having any discomfort whatsoever at this point. 07/16/17 on evaluation today patient actually appears to be doing rather well in regard to her sacral wound. I think that the original wound site actually appears much better than previously noted. With that being said she does have Jessica new superficial injury on the right sacral region which appears to be due to according to the sign transport that occurred for her MRI unfortunately. Fortunately this is not too deep and I do think it can be managed but it was Jessica new area that was not previously noted. Otherwise she has been tolerating the Dakinos soaked gauze packing without complication. 07/30/17 on evaluation today patient presents for reevaluation concerning her sacral wound. She has been tolerating the dressing changes without complication. With that being said her wound is doing so well at this point that I think she may be at the point where we could reinitiate the Wound VAC currently and hopefully see good results from this. I'm very pleased with how she has responded to the Dakinos soaked gauze packing. 08/13/17 evaluation today patient appears to be doing excellent in regard to her lower extremity ulcer this seems to be cleaning up very nicely. In regard to the sacral ulcer the area of trauma on the right lateral portion of the wound actually appears to be much better than previously noted  during the last evaluation. The word has definitely filled in and the Wound VAC appears to be helpful I do believe. Overall I'm very pleased with how things seem to be progressing at this time. Patient likewise is also happy that things are doing well. 08/27/17 on evaluation today patient presents for follow-up concerning her ongoing issues with her sacral ulcer and lower extremity ulcer. Fortunately she has been tolerating the dressing changes well complication the Wound VAC in general seems to have done very well up to this point. She does not have any evidence of infection which is good news. She does have some dark discoloration in central portion of the wound which is troublesome for the possibility of pressure injury to the site it sounds like her prior air mattress was not functioning properly her son has just bought Jessica new one for her this is doing much better. Otherwise things seem to be progressing nicely. 09/10/17 on evaluation today patient presents for follow-up concerning her sacral wound and lower extremity ulcer. She has been tolerating the switch of her dressing changes to the Dakinos soaked gauze dressing very well in regard to the sacrum. The left lateral lower extremity ulcer appears to have Jessica new area open just distal to the one that we have been treating with Santyl and this is new since her last visit. There does not appear to be any evidence of infection which is good news. With that being said there's Jessica lot of maceration here at the site and I feel this is likely due to the switch and the dressings it appears that due to the sticking of the dressings it will switch to utilizing Jessica telfa pad over the Santyl. I think this caused Jessica lot of drainage to be collected and situated in the region just under the dressing which has led to  this causing which duration of breakdown. Overall there does not appear to be any significant pain which is good news. Of note I did actually have Jessica  conversation with the radiologist who is an interventional radiologist with [facility]. 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 With that being said there does not appear to be any rush to get this done according to the interventional radiologist whom I spoke with. Therefore after discussion it was determined that there gonna wait about six months prior to considering the procedure to get time for hopefully the sacral wound to heal in the interim to at least some degree. 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 According to nursing staff these are all improved, the negative pressure wound therapy system was put on hold last week. We will resume negative pressure wound therapy today, continue with Santyl to the lower extremity and she will follow-up next week. 09/24/17 on evaluation today unfortunately the patient's wound appears to be doing significantly worse compared to last week's evaluation and even my valuation the week before. She actually has bone noted on the left wound margin in the region of undermining unfortunately. This was not noted during the last evaluation. I am concerned about the fact that this seems to be worse not better since our last visit with her. My biggest concern is that she's likely developing Jessica worsening osteomyelitis. This was discussed with patient and her son today during the office visit. Electronic Signature(s) Signed: 09/26/2017 11:18:14 PM By: Lenda Kelp PA-C Entered By: Lenda Kelp on 09/26/2017 23:12:36 Jessica [name] ([id number]) -------------------------------------------------------------------------------- Physical Exam Details [name]. Date of Service: 09/24/2017 10:30 AM Medical Record Number: [id number] Patient Account Number: [id number] Date of Birth/Sex: [birth year] (72 y.o. F)  Treating RN: Primary Care Provider: Annita Brod Other Clinician: Referring Provider: Annita Brod Treating Provider/Extender: STONE III, Emmalene Kattner Weeks in Treatment: 17 Constitutional Well-nourished and well-hydrated in no acute distress. Eyes conjunctiva clear no eyelid edema noted. pupils equal round and reactive to light and accommodation. Respiratory normal breathing without difficulty. clear to auscultation bilaterally. Cardiovascular regular rate and rhythm with normal S1, S2. Psychiatric this patient is able to make decisions and demonstrates good insight into disease process. Alert and Oriented x 3. pleasant and cooperative. Notes Patient's wound did have bone noted in the left wound base in the region of undermining which was necrotic and did required debridement today. I was able to debride away Jessica good portion of this today which he tolerated without complication and post debridement the bone that was remaining did not seem to be as fragile. Nonetheless I am singing this for both culture and pathology to further evaluate. Patient is in agreement with this plan. Unfortunately this news actually did upset her as well is her son quite significantly today. Nonetheless I felt that it was important for them to know what was going on and why we needed to proceed the direction we did. I am going to go ahead and send in Jessica prescription for her per above to get something started until we get the results of the culture back and she likely needs to be back on IV antibiotic therapy. Electronic Signature(s) Signed: 09/26/2017 11:18:14 PM By: Lenda Kelp PA-C Entered By: Lenda Kelp on 09/26/2017 23:13:21 Jessica [name] ([id number]) -------------------------------------------------------------------------------- Physician Orders Details [name].  Date of Service: 09/24/2017 10:30 AM Medical Record Number: [id number] Patient Account Number: [id number] Date  of Birth/Sex: [birth year] (72 y.o. F) Treating RN: Curtis Sites Primary Care Provider: Annita Brod Other Clinician: Referring Provider: Annita Brod Treating Provider/Extender: Linwood Dibbles, Adaliah Hiegel Weeks in Treatment: 17 Verbal / Phone Orders: No Diagnosis Coding ICD-10 Coding Code Description L89.154 Pressure ulcer of sacral region, stage 4 M48.00 Spinal stenosis, site unspecified R54 Age-related physical debility G89.4 Chronic pain syndrome E78.2 Mixed hyperlipidemia L89.93 Pressure ulcer of unspecified site, stage 3 Wound Cleansing Wound #1 Medial Sacrum o Other: - please cleanse sacral wound with dakins moistened gauze - do not spray dakins on wound Wound #2 Left Lower Leg o Clean wound with Normal Saline. Anesthetic (add to Medication List) Wound #1 Medial Sacrum o Topical Lidocaine 4% cream applied to wound bed prior to debridement (In Clinic Only). Wound #2 Left Lower Leg o Topical Lidocaine 4% cream applied to wound bed prior to debridement (In Clinic Only). Primary Wound Dressing Wound #1 Medial Sacrum o Alginate Wound #2 Left Lower Leg o Santyl Ointment - PLACE ON OPEN WOUND AREAS ON LEFT LATERAL LOWER LEG Secondary Dressing Wound #2 Left Lower Leg o Boardered Foam Dressing - or ABD pad and tape Dressing Change Frequency Wound #1 Medial Sacrum o Change dressing every day. Wound #2 Left Lower Leg o Change dressing every day. Jessica [name], Jessica [name] ([id number]) Follow-up Appointments Wound #1 Medial Sacrum o Return Appointment in 1 week. Wound #2 Left Lower Leg o Return Appointment in 1 week. Off-Loading Wound #1 Medial Sacrum o Turn and reposition every 2 hours Wound #2 Left Lower Leg o Turn and reposition every 2 hours Additional Orders / Instructions Wound #1 Medial Sacrum o Increase protein intake. Wound #2 Left Lower Leg o Increase protein intake. Home Health Wound #1 Medial Sacrum o Continue Home Health Visits -  [facility] o Home Health Nurse may visit PRN to address patientos wound care needs. o FACE TO FACE ENCOUNTER: MEDICARE and MEDICAID PATIENTS: I certify that this patient is under my care and that I had Jessica face-to-face encounter that meets the physician face-to-face encounter requirements with this patient on this date. The encounter with the patient was in whole or in part for the following MEDICAL CONDITION: (primary reason for Home Healthcare) MEDICAL NECESSITY: I certify, that based on my findings, NURSING services are Jessica medically necessary home health service. HOME BOUND STATUS: I certify that my clinical findings support that this patient is homebound (i.e., Due to illness or injury, pt requires aid of supportive devices such as crutches, cane, wheelchairs, walkers, the use of special transportation or the assistance of another person to leave their place of residence. There is Jessica normal inability to leave the home and doing so requires considerable and taxing effort. Other absences are for medical reasons / religious services and are infrequent or of short duration when for other reasons). o If current dressing causes regression in wound condition, may D/C ordered dressing product/s and apply Normal Saline Moist Dressing daily until next Wound Healing Center / Other MD appointment. Notify Wound Healing Center of regression in wound condition at [phone]. o Please direct any NON-WOUND related issues/requests for orders to patient's Primary Care Physician Wound #2 Left Lower Leg o Continue Home Health Visits - [facility] o Home Health Nurse may visit PRN to address patientos wound care needs. o FACE TO FACE ENCOUNTER: MEDICARE and MEDICAID PATIENTS: I certify that this patient is under my care and that I had  Jessica face-to-face encounter that meets the physician face-to-face encounter requirements with this patient on this date. The encounter with the patient was in whole or in part  for the following MEDICAL CONDITION: (primary reason for Home Healthcare) MEDICAL NECESSITY: I certify, that based on my findings, NURSING services are Jessica medically necessary home health service. HOME BOUND STATUS: I certify that my clinical findings support that this patient is homebound (i.e., Due to illness or injury, pt requires aid of supportive devices such as crutches, cane, wheelchairs, walkers, the use of special transportation or the assistance of another person to leave their place of residence. There is Jessica normal inability to leave the home and doing so requires considerable and taxing effort. Other absences are for medical reasons / religious services and are infrequent or of short duration when for other reasons). o If current dressing causes regression in wound condition, may D/C ordered dressing product/s and apply Normal Saline Moist Dressing daily until next Wound Healing Center / Other MD appointment. Notify Wound Healing Center of regression in wound condition at [phone]. o Please direct any NON-WOUND related issues/requests for orders to patient's Primary Care Physician Jessica [name], Jessica [name]. ([id number]) Negative Pressure Wound Therapy Wound #1 Medial Sacrum o Place NPWT on HOLD. Consults o Infectious Disease Laboratory o Bacteria identified in Wound by Culture (MICRO) oooo LOINC Code: [phone] oooo Convenience Name: Wound culture routine o Tissue Pathology biopsy report (PATH) oooo LOINC Code: [phone] oooo Convenience Name: Tiss Path Bx report Patient Medications Allergies: zinc, Ambien Notifications Medication Indication Start End Augmentin 09/27/2017 DOSE 1 - oral 875 mg-125 mg tablet - 1 tablet oral taken 2 times Jessica day for 15 days Electronic Signature(s) Signed: 09/27/2017 4:10:56 PM By: Lenda Kelp PA-C Previous Signature: 09/24/2017 4:47:18 PM Version By: Curtis Sites Previous Signature: 09/26/2017 11:18:14 PM Version By: Lenda Kelp  PA-C Entered By: Lenda Kelp on 09/27/2017 16:10:56 Jessica [name] ([id number]) -------------------------------------------------------------------------------- Problem List Details [name]. Date of Service: 09/24/2017 10:30 AM Medical Record Number: [id number] Patient Account Number: [id number] Date of Birth/Sex: 1945/09/10 (72 y.o. F) Treating RN: Primary Care Provider: Annita Brod Other Clinician: Referring Provider: Annita Brod Treating Provider/Extender: STONE III, Corrie Reder Weeks in Treatment: 17 Active Problems ICD-10 Impacting Encounter Code Description Active Date Wound Healing Diagnosis L89.154 Pressure ulcer of sacral region, stage 4 05/27/2017 No Yes M48.00 Spinal stenosis, site unspecified 05/27/2017 No Yes R54 Age-related physical debility 05/27/2017 No Yes G89.4 Chronic pain syndrome 05/27/2017 No Yes E78.2 Mixed hyperlipidemia 05/27/2017 No Yes L89.93 Pressure ulcer of unspecified site, stage 3 05/27/2017 No Yes Inactive Problems Resolved Problems Electronic Signature(s) Signed: 09/26/2017 11:18:14 PM By: Lenda Kelp PA-C Entered By: Lenda Kelp on 09/24/2017 10:24:08 Jessica [name] ([id number]) -------------------------------------------------------------------------------- Progress Note Details [name]. Date of Service: 09/24/2017 10:30 AM Medical Record Number: [id number] Patient Account Number: [id number] Date of Birth/Sex: [birth year] (72 y.o. F) Treating RN: Primary Care Provider: Annita Brod Other Clinician: Referring Provider: Annita Brod Treating Provider/Extender: Linwood Dibbles, Eian Vandervelden Weeks in Treatment: 17 Subjective Chief Complaint Information obtained from Patient she is here for evaluation of Jessica sacral ulcer and bilateral lower extremity ulcers History of Present Illness (HPI) 05/27/17-she is here in initial evaluation for Jessica left-sided sacral stage IV pressure ulcer and bilateral lower  extremity, lateral aspect, unstageable pressure ulcers. She is accompanied by her husband and her son, who are her primary caregivers. She is bedbound secondary to spinal stenosis. According to her son and  husband she was hospitalized from 10/5-11/1 with healthcare associated pneumonia and altered mental status. During her hospitalization she was intubated, extubated on 10/27. She was discharged with Foley catheter and follow up with urology. According to her son and spouse she developed the sacral ulcer during hospitalization. Home health has been applying Santyl daily. She does have Jessica low air loss mattress and is repositioned every 2-3 hours per her family's report. According to the son and spouse she had an appointment with urology on 12/18 and during that appointment developed discoloration to her bilateral lower extremities which ultimately developed into unstageable pressure ulcers to the lateral aspects of her bilateral lower extremities. There has been no topical treatment applied to these. She continues to have home health. There is no concerns expressed regarding dietary intake, stating she eats 3 meals Jessica day, eating was provided; she is supplemented with boost with protein. 06/03/17-she is here in follow-up evaluation for sacral and bilateral lower extremity ulcers. Plain film x-ray done today reveals no distraction to the sacrum or coccyx, no visible abnormalities. Home health has ordered the negative pressure wound system but it has not been initiated. We will continue with Hydrofera Blue until initiation of negative pressure wound system and continue with Santyl to bilateral lower extremity ulcers. Follow-up next week 06/10/17-she is here in follow-up evaluation for sacral and bilateral lower extremity ulcers. The wound VAC will be available tomorrow per home health. We will initiate wound VAC therapy to the sacral ulcer 3 times weekly (Thursday, Saturday/Sunday, Tuesday). We will  continue with Santyl to the lower extremity ulcers. The patient's son is checking into home health therapy over the weekend for [facility] changes, with the understanding that if VAC changes cannot be performed over the weekend he will need to change his mother's appointments to Monday, Wednesday or Friday. The sacral ulcer clinically does not appear infected but there has been Jessica change in the amount of drainage acutely, there is no significant amount of devitalized tissue, there is no malodor. Wound culture was obtained to evaluate for occult infection we will hold off on antibiotic therapy until sensitivities are resulted. 06/18/17 on evaluation today patient appears to be doing fairly well in my opinion although this is the first time I have seen this patient she has been previously evaluated by Tacey Ruiz here in our office. She is going to be switching to Fridays to see me due to the Wound VAC schedule being changed on Monday, Wednesday, and Friday. Subsequently she seems to be doing fairly well with the Wound VAC. Her son who was present during evaluation today states that he somewhat stresses of the Wound VAC in making sure that it was functioning appropriately. With that being said everything seems to be working well he knows what settings on the [facility] itself to look at and ensure that it is functioning properly. Overall the wound appears to be nice and clean there is no need for debridement today. She has no discomfort in her bilateral lower extremity ulcers also appear to be improving based on measurements and what this honest tell me about the overall appearance. 07/02/17 on evaluation today I noted in patients wound bed that was actually an odor that had not previously been noted. Subsequently there was also Jessica small area of bone that was not previously noted during my last evaluation. This did appear to be necrotic and was being somewhat forced out by the body around the region of granulation. She does not  have any pain  which is good news. Nonetheless the overall appearance of the ulcer is making me concerned for the patient having developed osteomyelitis. Currently she is not been on any antibiotics and the Wound VAC has been doing fairly well in general. With that being said I do not think we need to continue the Wound VAC if she potentially has Jessica bone infection. At least not until it is properly addressed with antibiotics. Jessica BradyOVERMAN, Clio W. ([id number]) 07/09/17 on evaluation today patient appears to actually be doing very well in regard to her sacral ulcer compared to last week. There is actually no exposed bone at this point. Her pathology report showed early signs of osteomyelitis which had explained to the patient son is definitely good news catching this early is often Jessica key to getting it better without things worsening. With that being said still I do believe she needs to have Jessica referral to infectious disease due to the osteomyelitis I am gonna recommend that she continue with the doxycycline based on the culture results which showed Eikenella Corrodens as the organism identified that tetracycline should work for. She does not seem to be having any discomfort whatsoever at this point. 07/16/17 on evaluation today patient actually appears to be doing rather well in regard to her sacral wound. I think that the original wound site actually appears much better than previously noted. With that being said she does have Jessica new superficial injury on the right sacral region which appears to be due to according to the sign transport that occurred for her MRI unfortunately. Fortunately this is not too deep and I do think it can be managed but it was Jessica new area that was not previously noted. Otherwise she has been tolerating the Dakin s soaked gauze packing without complication. 07/30/17 on evaluation today patient presents for reevaluation concerning her sacral wound. She has been tolerating the dressing  changes without complication. With that being said her wound is doing so well at this point that I think she may be at the point where we could reinitiate the Wound VAC currently and hopefully see good results from this. I'm very pleased with how [facility] 08/13/17 evaluation today patient appears to be doing excellent in regard to her lower extremity ulcer this seems to be cleaning up very nicely. In regard to the sacral ulcer the area of trauma on the right lateral portion of the wound actually appears to be much better than previously noted during the last evaluation. The word has definitely filled in and the Wound VAC appears to be helpful I do believe. Overall I'm very pleased with how things seem to be progressing at this time. Patient likewise is also happy that things are doing well. 08/27/17 on evaluation today patient presents for follow-up concerning her ongoing issues with her sacral ulcer and lower extremity ulcer. Fortunately she has been tolerating the dressing changes well complication the Wound VAC in general seems to have done very well up to this point. She does not have any evidence of infection which is good news. She does have some dark discoloration in central portion of the wound which is troublesome for the possibility of pressure injury to the site it sounds like her prior air mattress was not functioning properly her son has just bought Jessica new one for her this is doing much better. Otherwise things seem to be progressing nicely. 09/10/17 on evaluation today patient presents for follow-up concerning her sacral  wound and lower extremity ulcer. She has been tolerating the switch of her dressing changes to the Dakin s soaked gauze dressing very well in regard to the sacrum. The left lateral lower extremity ulcer appears to have Jessica new area open just distal to the one that we have been treating with Santyl and this is new since her last  visit. There does not appear to be any evidence of infection which is good news. With that being said there's Jessica lot of maceration here at the site and I feel this is likely due to the switch and the dressings it appears that due to the sticking of the dressings it will switch to utilizing Jessica telfa pad over the Santyl. I think this caused Jessica lot of drainage to be collected and situated in the region just under the dressing which has led to this causing which duration of breakdown. Overall there does not appear to be any significant pain which is good news. Of note I did actually have Jessica conversation with the radiologist who is an interventional radiologist with [facility]. 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 With that being said there does not appear to be any rush to get this done according to the interventional radiologist whom I spoke with. Therefore after discussion it was determined that there gonna wait about six months prior to considering the procedure to get time for hopefully the sacral wound to heal in the interim to at least some degree. 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 According to nursing staff these are all improved, the negative pressure wound therapy system was put on hold last week. We will resume negative pressure wound therapy today, continue with Santyl to the lower extremity and she will follow-up next week. 09/24/17 on evaluation today unfortunately the patient's wound appears to be doing significantly worse compared to last week's evaluation and even my valuation the week before. She actually has bone noted on the left wound margin in the region of undermining unfortunately. This was not noted during the last evaluation. I am concerned about the fact that this seems to be worse not better since our last visit with her. My biggest concern  is that she's likely developing Jessica worsening osteomyelitis. This was discussed with patient and her son today during the office visit. Patient History Information obtained from Patient. Family History Jessica [name], Jessica [name] ([id number]) No family history of Cancer, Diabetes, Heart Disease. Social History Never smoker, Marital Status - Married. Review of Systems (ROS) Constitutional Symptoms (General Health) Denies complaints or symptoms of Fever, Chills. Respiratory The patient has no complaints or symptoms. Cardiovascular The patient has no complaints or symptoms. Psychiatric The patient has no complaints or symptoms. Objective Constitutional Well-nourished and well-hydrated in no acute distress. Vitals Time Taken: 10:35 AM, Height: 70 in, Temperature: 98.5 F, Pulse: 89 bpm, Respiratory Rate: 18 breaths/min, Blood Pressure: 152/70 mmHg. Eyes conjunctiva clear no eyelid edema noted. pupils equal round and reactive to light and accommodation. Respiratory normal breathing without difficulty. clear to auscultation bilaterally. Cardiovascular regular rate and rhythm with normal S1, S2. Psychiatric this patient is able to make decisions and demonstrates good insight into disease process. Alert and Oriented x 3. pleasant and cooperative. General Notes: Patient's wound did have bone noted in the left wound base in the region of undermining which was necrotic and did required debridement today.  I was able to debride away Jessica good portion of this today which he tolerated without complication and post debridement the bone that was remaining did not seem to be as fragile. Nonetheless I am singing this for both culture and pathology to further evaluate. Patient is in agreement with this plan. Unfortunately this news actually did upset her as well is her son quite significantly today. Nonetheless I felt that it was important for them to know what was going on and why we needed to proceed the  direction we did. I am going to go ahead and send in Jessica prescription for her per above to get something started until we get the results of the culture back and she likely needs to be back on IV antibiotic therapy. Integumentary (Hair, Skin) Wound #1 status is Open. Original cause of wound was Pressure Injury. The wound is located on the Medial Sacrum. The wound measures 8cm length x 5.5cm width x 2cm depth; 34.558cm^2 area and 69.115cm^3 volume. There is bone, muscle, and Fat Layer (Subcutaneous Tissue) Exposed exposed. There is undermining starting at 9:00 and ending at 12:00 with Jessica maximum distance of 3.2cm. There is Jessica large amount of purulent drainage noted. Foul odor after cleansing was noted. The wound margin is distinct with the outline attached to the wound base. There is medium (34-66%) red, pink, hyper - granulation Jessica [name], [name] ([id number]) within the wound bed. There is Jessica medium (34-66%) amount of necrotic tissue within the wound bed including Eschar and Adherent Slough. The periwound skin appearance exhibited: Induration, Scarring, Rubor, Erythema. The periwound skin appearance did not exhibit: Callus, Crepitus, Excoriation, Rash, Dry/Scaly, Maceration, Atrophie Blanche, Cyanosis, Ecchymosis, Hemosiderin Staining, Mottled, Pallor. The surrounding wound skin color is noted with erythema which is circumferential. Periwound temperature was noted as No Abnormality. The periwound has tenderness on palpation. Wound #2 status is Open. Original cause of wound was Gradually Appeared. The wound is located on the Left Lower Leg. The wound measures 1.5cm length x 1cm width x 0.5cm depth; 1.178cm^2 area and 0.589cm^3 volume. There is Fat Layer (Subcutaneous Tissue) Exposed exposed. There is no tunneling or undermining noted. There is Jessica large amount of serous drainage noted. The wound margin is distinct with the outline attached to the wound base. There is medium (34-66%) red, pink granulation  within the wound bed. There is Jessica medium (34-66%) amount of necrotic tissue within the wound bed including Adherent Slough. The periwound skin appearance did not exhibit: Callus, Crepitus, Excoriation, Induration, Rash, Scarring, Dry/Scaly, Maceration, Atrophie Blanche, Cyanosis, Ecchymosis, Hemosiderin Staining, Mottled, Pallor, Rubor, Erythema. Periwound temperature was noted as No Abnormality. Assessment Active Problems ICD-10 Pressure ulcer of sacral region, stage 4 Spinal stenosis, site unspecified Age-related physical debility Chronic pain syndrome Mixed hyperlipidemia Pressure ulcer of unspecified site, stage 3 Procedures Wound #1 Pre-procedure diagnosis of Wound #1 is Jessica Pressure Ulcer located on the Medial Sacrum . There was Jessica Excisional Skin/Subcutaneous Tissue/Muscle/Bone Debridement with Jessica total area of 11.25 sq cm performed by STONE III, Story Conti E., PA-C. With the following instrument(s): Curette, and Forceps to remove Viable and Non-Viable tissue/material. Material removed includes Bone,Subcutaneous Tissue, Slough, and Fibrin/Exudate after achieving pain control using Lidocaine 4% Topical Solution. 4 specimens were taken by Jessica Tissue Culture and sent to the lab per facility protocol. Jessica time out was conducted at 11:28, prior to the start of the procedure. Jessica Minimum amount of bleeding was controlled with Pressure. The procedure was tolerated well with Jessica pain level of 0  throughout and Jessica pain level of 0 following the procedure. Patient s Level of Consciousness post procedure was recorded as Awake and Alert. Post Debridement Measurements: 8cm length x 5.5cm width x 2.3cm depth; 79.482cm^3 volume. Post debridement Stage noted as Category/Stage IV. Character of Wound/Ulcer Post Debridement is improved. Post procedure Diagnosis Wound #1: Same as Pre-Procedure Plan Wound Cleansing: Jessica [name], Jessica [name]. ([id number]) Wound #1 Medial Sacrum: Other: - please cleanse sacral wound with dakins  moistened gauze - do not spray dakins on wound Wound #2 Left Lower Leg: Clean wound with Normal Saline. Anesthetic (add to Medication List): Wound #1 Medial Sacrum: Topical Lidocaine 4% cream applied to wound bed prior to debridement (In Clinic Only). Wound #2 Left Lower Leg: Topical Lidocaine 4% cream applied to wound bed prior to debridement (In Clinic Only). Primary Wound Dressing: Wound #1 Medial Sacrum: Alginate Wound #2 Left Lower Leg: Santyl Ointment - PLACE ON OPEN WOUND AREAS ON LEFT LATERAL LOWER LEG Secondary Dressing: Wound #2 Left Lower Leg: Boardered Foam Dressing - or ABD pad and tape Dressing Change Frequency: Wound #1 Medial Sacrum: Change dressing every day. Wound #2 Left Lower Leg: Change dressing every day. Follow-up Appointments: Wound #1 Medial Sacrum: Return Appointment in 1 week. Wound #2 Left Lower Leg: Return Appointment in 1 week. Off-Loading: Wound #1 Medial Sacrum: Turn and reposition every 2 hours Wound #2 Left Lower Leg: Turn and reposition every 2 hours Additional Orders / Instructions: Wound #1 Medial Sacrum: Increase protein intake. Wound #2 Left Lower Leg: Increase protein intake. Home Health: Wound #1 Medial Sacrum: Continue Home Health Visits - [facility] Nurse may visit PRN to address patient s wound care needs. FACE TO FACE ENCOUNTER: MEDICARE and MEDICAID PATIENTS: I certify that this patient is under my care and that I had Jessica face-to-face encounter that meets the physician face-to-face encounter requirements with this patient on this date. The encounter with the patient was in whole or in part for the following MEDICAL CONDITION: (primary reason for Home Healthcare) MEDICAL NECESSITY: I certify, that based on my findings, NURSING services are Jessica medically necessary home health service. HOME BOUND STATUS: I certify that my clinical findings support that this patient is homebound (i.e., Due to illness or injury, pt requires  aid of supportive devices such as crutches, cane, wheelchairs, walkers, the use of special transportation or the assistance of another person to leave their place of residence. There is Jessica normal inability to leave the home and doing so requires considerable and taxing effort. Other absences are for medical reasons / religious services and are infrequent or of short duration when for other reasons). If current dressing causes regression in wound condition, may D/C ordered dressing product/s and apply Normal Saline Moist Dressing daily until next Wound Healing Center / Other MD appointment. Notify Wound Healing Center of regression in wound condition at [phone]. Please direct any NON-WOUND related issues/requests for orders to patient's Primary Care Physician Wound #2 Left Lower Leg: Continue Home Health Visits - [facility] Nurse may visit PRN to address patient s wound care needs. FACE TO FACE ENCOUNTER: MEDICARE and MEDICAID PATIENTS: I certify that this patient is under my care and that I had Jessica face-to-face encounter that meets the physician face-to-face encounter requirements with this patient on this date. The encounter with the patient was in whole or in part for the following MEDICAL CONDITION: (primary reason for Home Jessica [name], Jessica [name]. ([id number]) Healthcare) MEDICAL NECESSITY: I certify, that based on my findings,  NURSING services are Jessica medically necessary home health service. HOME BOUND STATUS: I certify that my clinical findings support that this patient is homebound (i.e., Due to illness or injury, pt requires aid of supportive devices such as crutches, cane, wheelchairs, walkers, the use of special transportation or the assistance of another person to leave their place of residence. There is Jessica normal inability to leave the home and doing so requires considerable and taxing effort. Other absences are for medical reasons / religious services and are infrequent or of  short duration when for other reasons). If current dressing causes regression in wound condition, may D/C ordered dressing product/s and apply Normal Saline Moist Dressing daily until next Wound Healing Center / Other MD appointment. Notify Wound Healing Center of regression in wound condition at [phone]. Please direct any NON-WOUND related issues/requests for orders to patient's Primary Care Physician Negative Pressure Wound Therapy: Wound #1 Medial Sacrum: Place NPWT on HOLD. Laboratory ordered were: Wound culture routine, Tiss Path Bx report Consults ordered were: Infectious Disease The following medication(s) was prescribed: Augmentin oral 875 mg-125 mg tablet 1 1 tablet oral taken 2 times Jessica day for 15 days starting 09/27/2017 At this point I'm gonna again send in Jessica prescription for the patient in order to get her back on antibiotics to help with her symptoms currently. She's in agreement with this plan. With that being said her son did seem to be quite upset in regard to what was going on which I completely understand obviously but hopeful that this would go much better than what it appears to have gone at this point. We are gonna make Jessica referral back to infectious disease for the patient as well. We will subsequently see were things stand in one weeks time we see her back for reevaluation. Please see above for specific wound care orders. We will see patient for re-evaluation in 1 week(s) here in the clinic. If anything worsens or changes patient will contact our office for additional recommendations. Electronic Signature(s) Signed: 09/28/2017 1:56:08 AM By: Lenda Kelp PA-C Previous Signature: 09/26/2017 11:18:14 PM Version By: Lenda Kelp PA-C Entered By: Lenda Kelp on 09/27/2017 16:11:10 Jessica [name] ([id number]) -------------------------------------------------------------------------------- ROS/PFSH Details [name]. Date of Service:  09/24/2017 10:30 AM Medical Record Number: [id number] Patient Account Number: [id number] Date of Birth/Sex: [birth year] (71 y.o. F) Treating RN: Primary Care Provider: Annita Brod Other Clinician: Referring Provider: Annita Brod Treating Provider/Extender: STONE III, Govind Furey Weeks in Treatment: 17 Information Obtained From Patient Wound History Constitutional Symptoms (General Health) Complaints and Symptoms: Negative for: Fever; Chills Respiratory Complaints and Symptoms: No Complaints or Symptoms Cardiovascular Complaints and Symptoms: No Complaints or Symptoms Psychiatric Complaints and Symptoms: No Complaints or Symptoms Immunizations Pneumococcal Vaccine: Received Pneumococcal Vaccination: No Implantable Devices Family and Social History Cancer: No; Diabetes: No; Heart Disease: No; Never smoker; Marital Status - Married Physician Affirmation I have reviewed and agree with the above information. Electronic Signature(s) Signed: 09/26/2017 11:18:14 PM By: Lenda Kelp PA-C Entered By: Lenda Kelp on 09/26/2017 23:12:58 Jessica [name] ([id number]) -------------------------------------------------------------------------------- SuperBill Details [name]. Date of Service: 09/24/2017 Medical Record Number: [id number] Patient Account Number: [id number] Date of Birth/Sex: 08-13-1945 (72 y.o. F) Treating RN: Primary Care Provider: Annita Brod Other Clinician: Referring Provider: Annita Brod Treating Provider/Extender: STONE III, Serrina Minogue Weeks in Treatment: 17 Diagnosis Coding ICD-10 Codes Code Description L89.154 Pressure ulcer of sacral region, stage 4 M48.00 Spinal stenosis, site unspecified R54 Age-related  physical debility G89.4 Chronic pain syndrome E78.2 Mixed hyperlipidemia L89.93 Pressure ulcer of unspecified site, stage 3 Facility Procedures CPT4 Code: 16109604 Description: 11044 - DEB BONE 20 SQ CM/< ICD-10 Diagnosis  Description L89.154 Pressure ulcer of sacral region, stage 4 Modifier: Quantity: 1 Physician Procedures CPT4: Description Modifier Quantity Code 5409811 99214 - WC PHYS LEVEL 4 - EST PT 25 1 ICD-10 Diagnosis Description L89.154 Pressure ulcer of sacral region, stage 4 M48.00 Spinal stenosis, site unspecified R54 Age-related physical debility G89.4 Chronic  pain syndrome CPT4: 9147829 Debridement; bone (includes epidermis, dermis, subQ tissue, muscle and/or fascia, if 1 performed) 1st 20 sqcm or less ICD-10 Diagnosis Description L89.154 Pressure ulcer of sacral region, stage 4 Electronic Signature(s) Signed: 09/26/2017 11:18:14 PM By: Lenda Kelp PA-C Entered By: Lenda Kelp on 09/26/2017 23:14:05

## 2017-09-29 ENCOUNTER — Other Ambulatory Visit: Payer: Self-pay

## 2017-09-29 NOTE — Progress Notes (Signed)
Jessica [name], Yanett W. ([id number]) Visit Report for 09/24/2017 Arrival Information Details [name], [name] Date of Service: 09/24/2017 10:30 AM Medical Record Number: [id number] Patient Account Number: [id number] Date of Birth/Sex: 06/11/1945 (72 y.o. F) Treating RN: Renne CriglerFlinchum, Cheryl Primary Care Shai Mckenzie: Annita BrodASENSO, PHILIP Other Clinician: Referring Ace Bergfeld: Annita BrodASENSO, PHILIP Treating Alicia Ackert/Extender: Linwood DibblesSTONE III, HOYT Weeks in Treatment: 17 Visit Information History Since Last Visit All ordered tests and consults were completed: No Patient Arrived: Stretcher Added or deleted any medications: No Arrival Time: 10:25 Any new allergies or adverse reactions: No Accompanied By: son Had a fall or experienced change in No Transfer Assistance: None activities of daily living that may affect Patient Identification Verified: Yes risk of falls: Secondary Verification Process Yes Signs or symptoms of abuse/neglect since last visito No Completed: Hospitalized since last visit: No Patient Has Alerts: Yes Implantable device outside of the clinic excluding No Patient Alerts: ALLERGIC TO cellular tissue based products placed in the center ZINC since last visit: Pain Present Now: No Electronic Signature(s) Signed: 09/24/2017 4:47:18 PM By: Curtis Sitesorthy, Joanna Entered By: Curtis Sitesorthy, Joanna on 09/24/2017 11:17:54 Jessica [name], [name] ([id number]) -------------------------------------------------------------------------------- Encounter Discharge Information Details [name], [name] Date of Service: 09/24/2017 10:30 AM Medical Record Number: [id number] Patient Account Number: [id number] Date of Birth/Sex: 06/11/1945 (72 y.o. F) Treating RN: Phillis HaggisPinkerton, Debi Primary Care Gracy Ehly: Annita BrodASENSO, PHILIP Other Clinician: Referring Jamae Tison: Annita BrodASENSO, PHILIP Treating Feliciano Wynter/Extender: Linwood DibblesSTONE III, HOYT Weeks in Treatment: 17 Encounter Discharge Information Items Discharge Condition:  Stable Ambulatory Status: Stretcher Discharge Destination: Home Transportation: Ambulance Accompanied By: son Schedule Follow-up Appointment: Yes Clinical Summary of Care: Electronic Signature(s) Signed: 09/24/2017 1:13:49 PM By: Alejandro MullingPinkerton, Debra Entered By: Alejandro MullingPinkerton, Debra on 09/24/2017 13:13:49 Jessica [name], [name] ([id number]) -------------------------------------------------------------------------------- Lower Extremity Assessment Details [name], Mckinlee W. Date of Service: 09/24/2017 10:30 AM Medical Record Number: [id number] Patient Account Number: [id number] Date of Birth/Sex: 06/11/1945 (72 y.o. F) Treating RN: Renne CriglerFlinchum, Cheryl Primary Care Mathews Stuhr: Annita BrodASENSO, PHILIP Other Clinician: Referring Avishai Reihl: Annita BrodASENSO, PHILIP Treating Maybel Dambrosio/Extender: STONE III, HOYT Weeks in Treatment: 17 Edema Assessment Assessed: [Left: No] [Right: No] Edema: [Left: N] [Right: o] Vascular Assessment Claudication: Claudication Assessment [Left:None] Pulses: Dorsalis Pedis Palpable: [Left:Yes] Posterior Tibial Extremity colors, hair growth, and conditions: Extremity Color: [Left:Normal] Hair Growth on Extremity: [Left:Yes] Temperature of Extremity: [Left:Warm] Capillary Refill: [Left:< 3 seconds] Toe Nail Assessment Left: Right: Thick: Yes Discolored: Yes Deformed: Yes Improper Length and Hygiene: Yes Electronic Signature(s) Signed: 09/24/2017 4:53:10 PM By: Renne CriglerFlinchum, Cheryl Entered By: Renne CriglerFlinchum, Cheryl on 09/24/2017 10:50:27 Jessica [name], [name] ([id number]) -------------------------------------------------------------------------------- Multi Wound Chart Details [name], [name] Date of Service: 09/24/2017 10:30 AM Medical Record Number: [id number] Patient Account Number: [id number] Date of Birth/Sex: 06/11/1945 (72 y.o. F) Treating RN: Curtis Sitesorthy, Joanna Primary Care Jenella Craigie: Annita BrodASENSO, PHILIP Other Clinician: Referring Sammuel Blick: Annita BrodASENSO, PHILIP Treating  Gerrad Welker/Extender: STONE III, HOYT Weeks in Treatment: 17 Vital Signs Height(in): 70 Pulse(bpm): 89 Weight(lbs): Blood Pressure(mmHg): 152/70 Body Mass Index(BMI): Temperature(F): 98.5 Respiratory Rate 18 (breaths/min): Photos: [1:No Photos] [2:No Photos] [N/A:N/A] Wound Location: [1:Sacrum - Medial] [2:Left Lower Leg] [N/A:N/A] Wounding Event: [1:Pressure Injury] [2:Gradually Appeared] [N/A:N/A] Primary Etiology: [1:Pressure Ulcer] [2:Pressure Ulcer] [N/A:N/A] Date Acquired: [1:01/15/2017] [2:04/28/2017] [N/A:N/A] Weeks of Treatment: [1:17] [2:17] [N/A:N/A] Wound Status: [1:Open] [2:Open] [N/A:N/A] Measurements L x W x D [1:8x5.5x2] [2:1.5x1x0.5] [N/A:N/A] (cm) Area (cm) : [1:34.558] [2:1.178] [N/A:N/A] Volume (cm) : [1:69.115] [2:0.589] [N/A:N/A] % Reduction in Area: [1:6.20%] [2:-150.10%] [N/A:N/A] % Reduction in Volume: [1:-87.60%] [2:-526.60%] [N/A:N/A] Starting Position 1 [1:9] (o'clock): Ending Position 1 [1:12] (o'clock):  Maximum Distance 1 (cm): [1:3.2] Undermining: [1:Yes] [2:No] [N/A:N/A] Classification: [1:Category/Stage IV] [2:Category/Stage III] [N/A:N/A] Exudate Amount: [1:Large] [2:Large] [N/A:N/A] Exudate Type: [1:Purulent] [2:Serous] [N/A:N/A] Exudate Color: [1:yellow, brown, green] [2:amber] [N/A:N/A] Foul Odor After Cleansing: [1:Yes] [2:No] [N/A:N/A] Odor Anticipated Due to [1:No] [2:N/A] [N/A:N/A] Product Use: Wound Margin: [1:Distinct, outline attached] [2:Distinct, outline attached] [N/A:N/A] Granulation Amount: [1:Medium (34-66%)] [2:Medium (34-66%)] [N/A:N/A] Granulation Quality: [1:Red, Pink, Hyper-granulation] [2:Red, Pink] [N/A:N/A] Necrotic Amount: [1:Medium (34-66%)] [2:Medium (34-66%)] [N/A:N/A] Necrotic Tissue: [1:Eschar, Adherent Slough] [2:Adherent Slough] [N/A:N/A] Exposed Structures: [1:Fat Layer (Subcutaneous Tissue) Exposed: Yes Muscle: Yes Bone: Yes Fascia: No] [2:Fat Layer (Subcutaneous Tissue) Exposed: Yes Fascia: No Tendon: No  Muscle: No] [N/A:N/A] Tendon: No Joint: No Joint: No Bone: No Epithelialization: None None N/A Periwound Skin Texture: Induration: Yes Excoriation: No N/A Scarring: Yes Induration: No Excoriation: No Callus: No Callus: No Crepitus: No Crepitus: No Rash: No Rash: No Scarring: No Periwound Skin Moisture: Maceration: No Maceration: No N/A Dry/Scaly: No Dry/Scaly: No Periwound Skin Color: Erythema: Yes Atrophie Blanche: No N/A Rubor: Yes Cyanosis: No Atrophie Blanche: No Ecchymosis: No Cyanosis: No Erythema: No Ecchymosis: No Hemosiderin Staining: No Hemosiderin Staining: No Mottled: No Mottled: No Pallor: No Pallor: No Rubor: No Erythema Location: Circumferential N/A N/A Temperature: No Abnormality No Abnormality N/A Tenderness on Palpation: Yes No N/A Wound Preparation: Ulcer Cleansing: Ulcer Cleansing: N/A Rinsed/Irrigated with Saline Rinsed/Irrigated with Saline Topical Anesthetic Applied: Topical Anesthetic Applied: Other: lidocaine 4% Other: lidocaine 4% Treatment Notes Electronic Signature(s) Signed: 09/24/2017 4:47:18 PM By: Curtis Sites Entered By: Curtis Sites on 09/24/2017 11:16:47 Jessica [name] ([id number]) -------------------------------------------------------------------------------- Multi-Disciplinary Care Plan Details [name]. Date of Service: 09/24/2017 10:30 AM Medical Record Number: [id number] Patient Account Number: [id number] Date of Birth/Sex: January 09, 1946 (72 y.o. F) Treating RN: Curtis Sites Primary Care Aashvi Rezabek: Annita Brod Other Clinician: Referring Jericha Bryden: Annita Brod Treating Claudell Wohler/Extender: STONE III, HOYT Weeks in Treatment: 17 Active Inactive ` Orientation to the Wound Care Program Nursing Diagnoses: Knowledge deficit related to the wound healing center program Goals: Patient/caregiver will verbalize understanding of the Wound Healing Center Program Date Initiated:  06/03/2017 Target Resolution Date: 06/17/2017 Goal Status: Active Interventions: Provide education on orientation to the wound center Notes: ` Pressure Nursing Diagnoses: Knowledge deficit related to causes and risk factors for pressure ulcer development Knowledge deficit related to management of pressures ulcers Potential for impaired tissue integrity related to pressure, friction, moisture, and shear Goals: Patient will remain free from development of additional pressure ulcers Date Initiated: 06/03/2017 Target Resolution Date: 06/17/2017 Goal Status: Active Patient will remain free of pressure ulcers Date Initiated: 06/03/2017 Target Resolution Date: 06/17/2017 Goal Status: Active Patient/caregiver will verbalize risk factors for pressure ulcer development Date Initiated: 06/03/2017 Target Resolution Date: 06/17/2017 Goal Status: Active Interventions: Assess: immobility, friction, shearing, incontinence upon admission and as needed Assess potential for pressure ulcer upon admission and as needed Notes: ` Wound/Skin Impairment MARCELLINA, JONSSON ([id number]) Nursing Diagnoses: Impaired tissue integrity Goals: Patient/caregiver will verbalize understanding of skin care regimen Date Initiated: 06/03/2017 Target Resolution Date: 06/17/2017 Goal Status: Active Ulcer/skin breakdown will have a volume reduction of 30% by week 4 Date Initiated: 06/03/2017 Target Resolution Date: 06/17/2017 Goal Status: Active Interventions: Assess ulceration(s) every visit Treatment Activities: Patient referred to home care : 06/03/2017 Skin care regimen initiated : 06/03/2017 Notes: Electronic Signature(s) Signed: 09/24/2017 4:47:18 PM By: Curtis Sites Entered By: Curtis Sites on 09/24/2017 11:16:37 Jessica [name] ([id number]) -------------------------------------------------------------------------------- Pain Assessment Details [name]. Date of Service: 09/24/2017  10:30 AM Medical Record Number: [id number] Patient Account Number: [id number] Date of Birth/Sex: Dec 21, 1945 (72 y.o. F) Treating RN: Renne Crigler Primary Care Neo Yepiz: Annita Brod Other Clinician: Referring Bookert Guzzi: Annita Brod Treating Upton Russey/Extender: STONE III, HOYT Weeks in Treatment: 17 Active Problems Location of Pain Severity and Description of Pain Patient Has Paino No Site Locations Pain Management and Medication Current Pain Management: Electronic Signature(s) Signed: 09/24/2017 4:53:10 PM By: Renne Crigler Entered By: Renne Crigler on 09/24/2017 10:26:24 Jessica [name] ([id number]) -------------------------------------------------------------------------------- Patient/Caregiver Education Details [name]. Date of Service: 09/24/2017 10:30 AM Medical Record Number: [id number] Patient Account Number: [id number] Date of Birth/Gender: [birth year] (72 y.o. F) Treating RN: Phillis Haggis Primary Care Physician: Annita Brod Other Clinician: Referring Physician: Annita Brod Treating Physician/Extender: Skeet Simmer in Treatment: 17 Education Assessment Education Provided To: Patient Education Topics Provided Wound/Skin Impairment: Handouts: Caring for Your Ulcer, Skin Care Do's and Dont's, Other: change dressing as ordered Methods: Demonstration, Explain/Verbal Responses: State content correctly Electronic Signature(s) Signed: 09/27/2017 5:21:58 PM By: Alejandro Mulling Entered By: Alejandro Mulling on 09/24/2017 13:14:06 Jessica [name] ([id number]) -------------------------------------------------------------------------------- Wound Assessment Details [name]. Date of Service: 09/24/2017 10:30 AM Medical Record Number: [id number] Patient Account Number: [id number] Date of Birth/Sex: [birth year] (72 y.o. F) Treating RN: Renne Crigler Primary Care Dawanda Mapel: Annita Brod Other  Clinician: Referring Alisandra Son: Annita Brod Treating Arli Bree/Extender: STONE III, HOYT Weeks in Treatment: 17 Wound Status Wound Number: 1 Primary Etiology: Pressure Ulcer Wound Location: Sacrum - Medial Wound Status: Open Wounding Event: Pressure Injury Date Acquired: 01/15/2017 Weeks Of Treatment: 17 Clustered Wound: No Photos Photo Uploaded By: Renne Crigler on 09/24/2017 17:03:03 Wound Measurements Length: (cm) 8 Width: (cm) 5.5 Depth: (cm) 2 Area: (cm) 34.558 Volume: (cm) 69.115 % Reduction in Area: 6.2% % Reduction in Volume: -87.6% Epithelialization: None Undermining: Yes Starting Position (o'clock): 9 Ending Position (o'clock): 12 Maximum Distance: (cm) 3.2 Wound Description Classification: Category/Stage IV Wound Margin: Distinct, outline attached Exudate Amount: Large Exudate Type: Purulent Exudate Color: yellow, brown, green Foul Odor After Cleansing: Yes Due to Product Use: No Slough/Fibrino Yes Wound Bed Granulation Amount: Medium (34-66%) Exposed Structure Granulation Quality: Red, Pink, Hyper-granulation Fascia Exposed: No Necrotic Amount: Medium (34-66%) Fat Layer (Subcutaneous Tissue) Exposed: Yes Necrotic Quality: Eschar, Adherent Slough Tendon Exposed: No Muscle Exposed: Yes Necrosis of Muscle: No CHERONDA, ERCK. ([id number]) Joint Exposed: No Bone Exposed: Yes Periwound Skin Texture Texture Color No Abnormalities Noted: No No Abnormalities Noted: No Callus: No Atrophie Blanche: No Crepitus: No Cyanosis: No Excoriation: No Ecchymosis: No Induration: Yes Erythema: Yes Rash: No Erythema Location: Circumferential Scarring: Yes Hemosiderin Staining: No Mottled: No Moisture Pallor: No No Abnormalities Noted: No Rubor: Yes Dry / Scaly: No Maceration: No Temperature / Pain Temperature: No Abnormality Tenderness on Palpation: Yes Wound Preparation Ulcer Cleansing: Rinsed/Irrigated with Saline Topical Anesthetic  Applied: Other: lidocaine 4%, Treatment Notes Wound #1 (Medial Sacrum) 1. Cleansed with: Clean wound with Normal Saline 2. Anesthetic Topical Lidocaine 4% cream to wound bed prior to debridement 4. Dressing Applied: Calcium Alginate Other dressing (specify in notes) 5. Secondary Dressing Applied ABD Pad 7. Secured with Tape Notes xtrasorb, dakins wet to dry gauze Electronic Signature(s) Signed: 09/24/2017 4:53:10 PM By: Renne Crigler Entered By: Renne Crigler on 09/24/2017 10:48:25 Jessica [name] ([id number]) -------------------------------------------------------------------------------- Wound Assessment Details [name]. Date of Service: 09/24/2017 10:30 AM Medical Record Number: [id number] Patient Account Number: [id number] Date of Birth/Sex: 1945-05-02 (72 y.o. F) Treating RN: Flinchum,  Cheryl Primary Care Taahir Grisby: Annita Brod Other Clinician: Referring Ferron Ishmael: Annita Brod Treating Dinisha Cai/Extender: STONE III, HOYT Weeks in Treatment: 17 Wound Status Wound Number: 2 Primary Etiology: Pressure Ulcer Wound Location: Left Lower Leg Wound Status: Open Wounding Event: Gradually Appeared Date Acquired: 04/28/2017 Weeks Of Treatment: 17 Clustered Wound: No Photos Photo Uploaded By: Renne Crigler on 09/24/2017 17:03:03 Wound Measurements Length: (cm) 1.5 Width: (cm) 1 Depth: (cm) 0.5 Area: (cm) 1.178 Volume: (cm) 0.589 % Reduction in Area: -150.1% % Reduction in Volume: -526.6% Epithelialization: None Tunneling: No Undermining: No Wound Description Classification: Category/Stage III Wound Margin: Distinct, outline attached Exudate Amount: Large Exudate Type: Serous Exudate Color: amber Foul Odor After Cleansing: No Slough/Fibrino Yes Wound Bed Granulation Amount: Medium (34-66%) Exposed Structure Granulation Quality: Red, Pink Fascia Exposed: No Necrotic Amount: Medium (34-66%) Fat Layer (Subcutaneous Tissue)  Exposed: Yes Necrotic Quality: Adherent Slough Tendon Exposed: No Muscle Exposed: No Joint Exposed: No Bone Exposed: No Periwound Skin Texture SHADONNA, BENEDICK. ([id number]) Texture Color No Abnormalities Noted: No No Abnormalities Noted: No Callus: No Atrophie Blanche: No Crepitus: No Cyanosis: No Excoriation: No Ecchymosis: No Induration: No Erythema: No Rash: No Hemosiderin Staining: No Scarring: No Mottled: No Pallor: No Moisture Rubor: No No Abnormalities Noted: No Dry / Scaly: No Temperature / Pain Maceration: No Temperature: No Abnormality Wound Preparation Ulcer Cleansing: Rinsed/Irrigated with Saline Topical Anesthetic Applied: Other: lidocaine 4%, Treatment Notes Wound #2 (Left Lower Leg) 1. Cleansed with: Clean wound with Normal Saline 2. Anesthetic Topical Lidocaine 4% cream to wound bed prior to debridement 4. Dressing Applied: Santyl Ointment 5. Secondary Dressing Applied Dry Gauze 7. Secured with Secretary/administrator) Signed: 09/24/2017 4:53:10 PM By: Renne Crigler Entered By: Renne Crigler on 09/24/2017 10:49:37 Jessica [name] ([id number]) -------------------------------------------------------------------------------- Vitals Details [name]. Date of Service: 09/24/2017 10:30 AM Medical Record Number: [id number] Patient Account Number: [id number] Date of Birth/Sex: 21-Jun-1945 (72 y.o. F) Treating RN: Renne Crigler Primary Care Tomika Eckles: Annita Brod Other Clinician: Referring Keyion Knack: Annita Brod Treating Syreeta Figler/Extender: STONE III, HOYT Weeks in Treatment: 17 Vital Signs Time Taken: 10:35 Temperature (F): 98.5 Height (in): 70 Pulse (bpm): 89 Respiratory Rate (breaths/min): 18 Blood Pressure (mmHg): 152/70 Reference Range: 80 - 120 mg / dl Electronic Signature(s) Signed: 09/24/2017 4:53:10 PM By: Renne Crigler Entered By: Renne Crigler on 09/24/2017 10:36:05

## 2017-10-01 ENCOUNTER — Encounter: Payer: Medicare Other | Admitting: Physician Assistant

## 2017-10-01 DIAGNOSIS — L89154 Pressure ulcer of sacral region, stage 4: Secondary | ICD-10-CM | POA: Diagnosis not present

## 2017-10-04 NOTE — Progress Notes (Signed)
Jessica, [name] ([id number]) Visit Report for 10/01/2017 Arrival Information Details [name], Jessica [name]. Date of Service: 10/01/2017 11:15 AM Medical Record Number: [id number] Patient Account Number: [id number] Date of Birth/Sex: 17-Oct-1945 (72 y.o. F) Treating RN: Renne Crigler Primary Care Aren Pryde: Annita Brod Other Clinician: Referring Daeton Kluth: Annita Brod Treating Sagan Wurzel/Extender: Linwood Dibbles, HOYT Weeks in Treatment: 18 Visit Information History Since Last Visit All ordered tests and consults were completed: No Patient Arrived: Stretcher Added or deleted any medications: No Arrival Time: 11:29 Any new allergies or adverse reactions: No Accompanied By: son Had a fall or experienced change in No Transfer Assistance: Stretcher activities of daily living that may affect Patient Identification Verified: Yes risk of falls: Secondary Verification Process Yes Signs or symptoms of abuse/neglect since last visito No Completed: Hospitalized since last visit: No Patient Has Alerts: Yes Implantable device outside of the clinic excluding No Patient Alerts: ALLERGIC TO cellular tissue based products placed in the center ZINC since last visit: Pain Present Now: No Electronic Signature(s) Signed: 10/01/2017 4:42:38 PM By: Renne Crigler Entered By: Renne Crigler on 10/01/2017 11:30:03 Jessica [name] ([id number]) -------------------------------------------------------------------------------- Lower Extremity Assessment Details [name]. Date of Service: 10/01/2017 11:15 AM Medical Record Number: [id number] Patient Account Number: [id number] Date of Birth/Sex: 1946-01-13 (72 y.o. F) Treating RN: Curtis Sites Primary Care Barret Esquivel: Annita Brod Other Clinician: Referring Nylene Inlow: Annita Brod Treating Goldy Calandra/Extender: Linwood Dibbles, HOYT Weeks in Treatment: 18 Electronic Signature(s) Signed: 10/01/2017 4:49:47 PM By: Curtis Sites Entered By: Curtis Sites on 10/01/2017 11:46:19 Jessica [name] ([id number]) -------------------------------------------------------------------------------- Multi Wound Chart Details [name]. Date of Service: 10/01/2017 11:15 AM Medical Record Number: [id number] Patient Account Number: [id number] Date of Birth/Sex: July 27, 1945 (72 y.o. F) Treating RN: Curtis Sites Primary Care Yenny Kosa: Annita Brod Other Clinician: Referring Jaydin Jalomo: Annita Brod Treating Beatris Belen/Extender: STONE III, HOYT Weeks in Treatment: 18 Vital Signs Height(in): 70 Pulse(bpm): 79 Weight(lbs): Blood Pressure(mmHg): 124/65 Body Mass Index(BMI): Temperature(F): 98.25 Respiratory Rate 18 (breaths/min): Photos: [1:No Photos] [2:No Photos] [N/A:N/A] Wound Location: [1:Sacrum - Medial] [2:Left Lower Leg] [N/A:N/A] Wounding Event: [1:Pressure Injury] [2:Gradually Appeared] [N/A:N/A] Primary Etiology: [1:Pressure Ulcer] [2:Pressure Ulcer] [N/A:N/A] Date Acquired: [1:01/15/2017] [2:04/28/2017] [N/A:N/A] Weeks of Treatment: [1:18] [2:18] [N/A:N/A] Wound Status: [1:Open] [2:Open] [N/A:N/A] Measurements L x W x D [1:7x6.5x2.3] [2:0.8x1x0.3] [N/A:N/A] (cm) Area (cm) : [1:35.736] [2:0.628] [N/A:N/A] Volume (cm) : [1:82.192] [2:0.188] [N/A:N/A] % Reduction in Area: [1:3.00%] [2:-33.30%] [N/A:N/A] % Reduction in Volume: [1:-123.00%] [2:-100.00%] [N/A:N/A] Starting Position 1 [1:10] (o'clock): Ending Position 1 [1:1] (o'clock): Maximum Distance 1 (cm): [1:3.8] Undermining: [1:Yes] [2:N/A] [N/A:N/A] Classification: [1:Category/Stage IV] [2:Category/Stage III] [N/A:N/A] Exudate Amount: [1:Large] [2:N/A] [N/A:N/A] Exudate Type: [1:Purulent] [2:N/A] [N/A:N/A] Exudate Color: [1:yellow, brown, green] [2:N/A] [N/A:N/A] Foul Odor After Cleansing: [1:Yes] [2:N/A] [N/A:N/A] Odor Anticipated Due to [1:No] [2:N/A] [N/A:N/A] Product Use: Wound Margin: [1:Distinct, outline  attached] [2:N/A] [N/A:N/A] Granulation Amount: [1:Medium (34-66%)] [2:N/A] [N/A:N/A] Granulation Quality: [1:Red, Pink, Hyper-granulation] [2:N/A] [N/A:N/A] Necrotic Amount: [1:Medium (34-66%)] [2:N/A] [N/A:N/A] Necrotic Tissue: [1:Eschar, Adherent Slough] [2:N/A] [N/A:N/A] Exposed Structures: [1:Fat Layer (Subcutaneous Tissue) Exposed: Yes Muscle: Yes Bone: Yes Fascia: No] [2:N/A] [N/A:N/A] Tendon: No Joint: No Epithelialization: None N/A N/A Periwound Skin Texture: Induration: Yes No Abnormalities Noted N/A Scarring: Yes Excoriation: No Callus: No Crepitus: No Rash: No Periwound Skin Moisture: Maceration: No No Abnormalities Noted N/A Dry/Scaly: No Periwound Skin Color: Erythema: Yes No Abnormalities Noted N/A Rubor: Yes Atrophie Blanche: No Cyanosis: No Ecchymosis: No Hemosiderin Staining: No Mottled: No Pallor: No Erythema Location: Circumferential N/A N/A  Temperature: No Abnormality N/A N/A Tenderness on Palpation: Yes No N/A Wound Preparation: Ulcer Cleansing: N/A N/A Rinsed/Irrigated with Saline Topical Anesthetic Applied: Other: lidocaine 4% Treatment Notes Electronic Signature(s) Signed: 10/01/2017 4:49:47 PM By: Curtis Sitesorthy, Joanna Entered By: Curtis Sitesorthy, Joanna on 10/01/2017 11:46:30 Jessica BradyVERMAN, Mosella W. ([id number]) -------------------------------------------------------------------------------- Multi-Disciplinary Care Plan Details Patient Name: Jessica BradyVERMAN, [name] Date of Service: 10/01/2017 11:15 AM Medical Record Number: [id number] Patient Account Number: [id number] Date of Birth/Sex: 1945-07-24 ([age] y.o. F) Treating RN: Curtis Sitesorthy, Joanna Primary Care Tyrece Vanterpool: Annita BrodASENSO, PHILIP Other Clinician: Referring Elis Rawlinson: Annita BrodASENSO, PHILIP Treating Thalya Fouche/Extender: STONE III, HOYT Weeks in Treatment: 18 Active Inactive ` Orientation to the Wound Care Program Nursing Diagnoses: Knowledge deficit related to the wound healing center program Goals: Patient/caregiver will  verbalize understanding of the Wound Healing Center Program Date Initiated: 06/03/2017 Target Resolution Date: 06/17/2017 Goal Status: Active Interventions: Provide education on orientation to the wound center Notes: ` Pressure Nursing Diagnoses: Knowledge deficit related to causes and risk factors for pressure ulcer development Knowledge deficit related to management of pressures ulcers Potential for impaired tissue integrity related to pressure, friction, moisture, and shear Goals: Patient will remain free from development of additional pressure ulcers Date Initiated: 06/03/2017 Target Resolution Date: 06/17/2017 Goal Status: Active Patient will remain free of pressure ulcers Date Initiated: 06/03/2017 Target Resolution Date: 06/17/2017 Goal Status: Active Patient/caregiver will verbalize risk factors for pressure ulcer development Date Initiated: 06/03/2017 Target Resolution Date: 06/17/2017 Goal Status: Active Interventions: Assess: immobility, friction, shearing, incontinence upon admission and as needed Assess potential for pressure ulcer upon admission and as needed Notes: ` Wound/Skin Impairment Jessica BradyOVERMAN, Fleda W. ([id number]) Nursing Diagnoses: Impaired tissue integrity Goals: Patient/caregiver will verbalize understanding of skin care regimen Date Initiated: 06/03/2017 Target Resolution Date: 06/17/2017 Goal Status: Active Ulcer/skin breakdown will have a volume reduction of 30% by week 4 Date Initiated: 06/03/2017 Target Resolution Date: 06/17/2017 Goal Status: Active Interventions: Assess ulceration(s) every visit Treatment Activities: Patient referred to home care : 06/03/2017 Skin care regimen initiated : 06/03/2017 Notes: Electronic Signature(s) Signed: 10/01/2017 11:44:04 AM By: Curtis Sitesorthy, Joanna Entered By: Curtis Sitesorthy, Joanna on 10/01/2017 11:44:03 Jessica BradyVERMAN, Anneke W. ([id number]) -------------------------------------------------------------------------------- Pain  Assessment Details Patient Name: Jessica BradyVERMAN, Xiamara W. Date of Service: 10/01/2017 11:15 AM Medical Record Number: [id number] Patient Account Number: [id number] Date of Birth/Sex: 1945-07-24 (72 y.o. F) Treating RN: Renne CriglerFlinchum, Cheryl Primary Care Corgan Mormile: Annita BrodASENSO, PHILIP Other Clinician: Referring Amare Kontos: Annita BrodASENSO, PHILIP Treating Jun Rightmyer/Extender: STONE III, HOYT Weeks in Treatment: 18 Active Problems Location of Pain Severity and Description of Pain Patient Has Paino No Site Locations Pain Management and Medication Current Pain Management: Electronic Signature(s) Signed: 10/01/2017 4:42:38 PM By: Renne CriglerFlinchum, Cheryl Entered By: Renne CriglerFlinchum, Cheryl on 10/01/2017 11:30:12 Jessica BradyVERMAN, Issis W. ([id number]) -------------------------------------------------------------------------------- Wound Assessment Details Patient Name: Jessica BradyVERMAN, Rashan W. Date of Service: 10/01/2017 11:15 AM Medical Record Number: [id number] Patient Account Number: [id number] Date of Birth/Sex: 1945-07-24 (72 y.o. F) Treating RN: Renne CriglerFlinchum, Cheryl Primary Care Jude Linck: Annita BrodASENSO, PHILIP Other Clinician: Referring Yesena Reaves: Annita BrodASENSO, PHILIP Treating Laiza Veenstra/Extender: STONE III, HOYT Weeks in Treatment: 18 Wound Status Wound Number: 1 Primary Etiology: Pressure Ulcer Wound Location: Sacrum - Medial Wound Status: Open Wounding Event: Pressure Injury Date Acquired: 01/15/2017 Weeks Of Treatment: 18 Clustered Wound: No Photos Photo Uploaded By: Renne CriglerFlinchum, Cheryl on 10/01/2017 14:43:05 Wound Measurements Length: (cm) 7 Width: (cm) 6.5 Depth: (cm) 2.3 Area: (cm) 35.736 Volume: (cm) 82.192 % Reduction in Area: 3% % Reduction in Volume: -123% Epithelialization: None Tunneling: No Undermining: Yes Starting Position (o'clock): 10 Ending Position (o'clock): 1 Maximum Distance: (cm)  3.8 Wound Description Classification: Category/Stage IV Wound Margin: Distinct, outline attached Exudate Amount: Large Exudate Type:  Purulent Exudate Color: yellow, brown, green Foul Odor After Cleansing: Yes Due to Product Use: No Slough/Fibrino Yes Wound Bed Granulation Amount: Medium (34-66%) Exposed Structure Granulation Quality: Red, Pink, Hyper-granulation Fascia Exposed: No Necrotic Amount: Medium (34-66%) Fat Layer (Subcutaneous Tissue) Exposed: Yes Necrotic Quality: Eschar, Adherent Slough Tendon Exposed: No Muscle Exposed: Yes DAMALI, BROADFOOT. ([id number]) Necrosis of Muscle: No Joint Exposed: No Bone Exposed: Yes Periwound Skin Texture Texture Color No Abnormalities Noted: No No Abnormalities Noted: No Callus: No Atrophie Blanche: No Crepitus: No Cyanosis: No Excoriation: No Ecchymosis: No Induration: Yes Erythema: Yes Rash: No Erythema Location: Circumferential Scarring: Yes Hemosiderin Staining: No Mottled: No Moisture Pallor: No No Abnormalities Noted: No Rubor: Yes Dry / Scaly: No Maceration: No Temperature / Pain Temperature: No Abnormality Tenderness on Palpation: Yes Wound Preparation Ulcer Cleansing: Rinsed/Irrigated with Saline Topical Anesthetic Applied: Other: lidocaine 4%, Electronic Signature(s) Signed: 10/01/2017 4:42:38 PM By: Renne Crigler Entered By: Renne Crigler on 10/01/2017 11:46:13 Jessica [name] ([id number]) -------------------------------------------------------------------------------- Wound Assessment Details [name]. Date of Service: 10/01/2017 11:15 AM Medical Record Number: [id number] Patient Account Number: [id number] Date of Birth/Sex: 1945/06/12 (72 y.o. F) Treating RN: Renne Crigler Primary Care Jared Whorley: Annita Brod Other Clinician: Referring Dmetrius Ambs: Annita Brod Treating Derico Mitton/Extender: STONE III, HOYT Weeks in Treatment: 18 Wound Status Wound Number: 2 Primary Etiology: Pressure Ulcer Wound Location: Left Lower Leg Wound Status: Open Wounding Event: Gradually Appeared Date Acquired:  04/28/2017 Weeks Of Treatment: 18 Clustered Wound: No Photos Photo Uploaded By: Renne Crigler on 10/01/2017 14:43:06 Wound Measurements Length: (cm) 0.8 Width: (cm) 1 Depth: (cm) 0.3 Area: (cm) 0.628 Volume: (cm) 0.188 % Reduction in Area: -33.3% % Reduction in Volume: -100% Wound Description Classification: Category/Stage III Periwound Skin Texture Texture Color No Abnormalities Noted: No No Abnormalities Noted: No Moisture No Abnormalities Noted: No Electronic Signature(s) Signed: 10/01/2017 4:42:38 PM By: Renne Crigler Entered By: Renne Crigler on 10/01/2017 11:40:04 Jessica [name] ([id number]) -------------------------------------------------------------------------------- Vitals Details [name]. Date of Service: 10/01/2017 11:15 AM Medical Record Number: [id number] Patient Account Number: [id number] Date of Birth/Sex: May 11, 1945 (72 y.o. F) Treating RN: Renne Crigler Primary Care Kimorah Ridolfi: Annita Brod Other Clinician: Referring Emrys Mceachron: Annita Brod Treating Teonia Yager/Extender: STONE III, HOYT Weeks in Treatment: 18 Vital Signs Time Taken: 11:30 Temperature (F): 98.25 Height (in): 70 Pulse (bpm): 79 Respiratory Rate (breaths/min): 18 Blood Pressure (mmHg): 124/65 Reference Range: 80 - 120 mg / dl Electronic Signature(s) Signed: 10/01/2017 4:42:38 PM By: Renne Crigler Entered By: Renne Crigler on 10/01/2017 11:30:52

## 2017-10-05 NOTE — Progress Notes (Signed)
KERYL, GHOLSON ([id number]) Visit Report for 10/01/2017 Chief Complaint Document Details [name], Jessica [name]. Date of Service: 10/01/2017 11:15 AM Medical Record Number: [id number] Patient Account Number: [id number] Date of Birth/Sex: 22-Dec-1945 (72 y.o. F) Treating RN: Curtis Sites Primary Care Provider: Annita Brod Other Clinician: Referring Provider: Annita Brod Treating Provider/Extender: Linwood Dibbles, HOYT Weeks in Treatment: 18 Information Obtained from: Patient Chief Complaint she is here for evaluation of a sacral ulcer and bilateral lower extremity ulcers Electronic Signature(s) Signed: 10/04/2017 12:55:36 AM By: Lenda Kelp PA-C Entered By: Lenda Kelp on 10/01/2017 11:40:53 Jessica [name] ([id number]) -------------------------------------------------------------------------------- Debridement Details [name]. Date of Service: 10/01/2017 11:15 AM Medical Record Number: [id number] Patient Account Number: [id number] Date of Birth/Sex: 1945/07/05 (72 y.o. F) Treating RN: Curtis Sites Primary Care Provider: Annita Brod Other Clinician: Referring Provider: Annita Brod Treating Provider/Extender: Linwood Dibbles, HOYT Weeks in Treatment: 18 Debridement Performed for Wound #1 Medial Sacrum Assessment: Performed By: Physician STONE III, HOYT E., PA-C Debridement Type: Debridement Pre-procedure Verification/Time Yes - 11:51 Out Taken: Start Time: 11:51 Pain Control: Lidocaine 4% Topical Solution Total Area Debrided (L x W): 2 (cm) x 2 (cm) = 4 (cm) Tissue and other material Viable, Non-Viable, Bone, Slough, Subcutaneous, Slough debrided: Level: Skin/Subcutaneous Tissue/Muscle/Bone Debridement Description: Excisional Instrument: Curette Bleeding: Minimum Hemostasis Achieved: Pressure End Time: 11:55 Procedural Pain: 0 Post Procedural Pain: 0 Response to Treatment: Procedure was tolerated well Level of  Consciousness: Awake and Alert Post Procedure Vitals: Temperature: 98.2 Pulse: 79 Respiratory Rate: 18 Blood Pressure: Systolic Blood Pressure: 124 Diastolic Blood Pressure: 65 Post Debridement Measurements of Total Wound Length: (cm) 7 Stage: Category/Stage IV Width: (cm) 6.5 Depth: (cm) 2.4 Volume: (cm) 85.765 Character of Wound/Ulcer Post Improved Debridement: Post Procedure Diagnosis Same as Pre-procedure Electronic Signature(s) Signed: 10/01/2017 4:49:47 PM By: Curtis Sites Signed: 10/04/2017 12:55:36 AM By: Lenda Kelp PA-C Entered By: Curtis Sites on 10/01/2017 11:54:06 Jessica [name] ([id number] Reach, Mellody Memos ([id number]) -------------------------------------------------------------------------------- HPI Details [name]. Date of Service: 10/01/2017 11:15 AM Medical Record Number: [id number] Patient Account Number: [id number] Date of Birth/Sex: 10/21/1945 (72 y.o. F) Treating RN: Curtis Sites Primary Care Provider: Annita Brod Other Clinician: Referring Provider: Annita Brod Treating Provider/Extender: Linwood Dibbles, HOYT Weeks in Treatment: 18 History of Present Illness HPI Description: 05/27/17-she is here in initial evaluation for a left-sided sacral stage IV pressure ulcer and bilateral lower extremity, lateral aspect, unstageable pressure ulcers. She is accompanied by her husband and her son, who are her primary caregivers. She is bedbound secondary to spinal stenosis. According to her son and husband she was hospitalized from 10/5-11/1 with healthcare associated pneumonia and altered mental status. During her hospitalization she was intubated, extubated on 10/27. She was discharged with Foley catheter and follow up with urology. According to her son and spouse she developed the sacral ulcer during hospitalization. Home health has been applying Santyl daily. She does have a low air loss mattress and is repositioned  every 2-3 hours per her family's report. According to the son and spouse she had an appointment with urology on 12/18 and during that appointment developed discoloration to her bilateral lower extremities which ultimately developed into unstageable pressure ulcers to the lateral aspects of her bilateral lower extremities. There has been no topical treatment applied to these. She continues to have home health. There is no concerns expressed regarding dietary intake, stating she eats 3 meals a day, eating was provided; she is supplemented with  boost with protein. 06/03/17-she is here in follow-up evaluation for sacral and bilateral lower extremity ulcers. Plain film x-ray done today reveals no distraction to the sacrum or coccyx, no visible abnormalities. Home health has ordered the negative pressure wound system but it has not been initiated. We will continue with Hydrofera Blue until initiation of negative pressure wound system and continue with Santyl to bilateral lower extremity ulcers. Follow-up next week 06/10/17-she is here in follow-up evaluation for sacral and bilateral lower extremity ulcers. The wound VAC will be available tomorrow per home health. We will initiate wound VAC therapy to the sacral ulcer 3 times weekly (Thursday, Saturday/Sunday, Tuesday). We will continue with Santyl to the lower extremity ulcers. The patient's son is checking into home health therapy over the weekend for [facility] changes, with the understanding that if VAC changes cannot be performed over the weekend he will need to change his mother's appointments to Monday, Wednesday or Friday. The sacral ulcer clinically does not appear infected but there has been a change in the amount of drainage acutely, there is no significant amount of devitalized tissue, there is no malodor. Wound culture was obtained to evaluate for occult infection we will hold off on antibiotic therapy until sensitivities are resulted. 06/18/17 on  evaluation today patient appears to be doing fairly well in my opinion although this is the first time I have seen this patient she has been previously evaluated by Tacey Ruiz here in our office. She is going to be switching to Fridays to see me due to the Wound VAC schedule being changed on Monday, Wednesday, and Friday. Subsequently she seems to be doing fairly well with the Wound VAC. Her son who was present during evaluation today states that he somewhat stresses of the Wound VAC in making sure that it was functioning appropriately. With that being said everything seems to be working well he knows what settings on the [facility] itself to look at and ensure that it is functioning properly. Overall the wound appears to be nice and clean there is no need for debridement today. She has no discomfort in her bilateral lower extremity ulcers also appear to be improving based on measurements and what this honest tell me about the overall appearance. 07/02/17 on evaluation today I noted in patients wound bed that was actually an odor that had not previously been noted. Subsequently there was also a small area of bone that was not previously noted during my last evaluation. This did appear to be necrotic and was being somewhat forced out by the body around the region of granulation. She does not have any pain which is good news. Nonetheless the overall appearance of the ulcer is making me concerned for the patient having developed osteomyelitis. Currently she is not been on any antibiotics and the Wound VAC has been doing fairly well in general. With that being said I do not think we need to continue the Wound VAC if she potentially has a bone infection. At least not until it is properly addressed with antibiotics. 07/09/17 on evaluation today patient appears to actually be doing very well in regard to her sacral ulcer compared to last week. There is actually no exposed bone at this point. Her pathology report showed  early signs of osteomyelitis which had explained to the patient son is definitely good news catching this early is often a key to getting it better without things worsening. With that being said still I do believe she needs to have a referral  to infectious disease due to the osteomyelitis I am gonna recommend that she continue with the doxycycline based on the culture results which showed Eikenella Corrodens as the Jessica [name], Jessica [name]. ([id number]) organism identified that tetracycline should work for. She does not seem to be having any discomfort whatsoever at this point. 07/16/17 on evaluation today patient actually appears to be doing rather well in regard to her sacral wound. I think that the original wound site actually appears much better than previously noted. With that being said she does have a new superficial injury on the right sacral region which appears to be due to according to the sign transport that occurred for her MRI unfortunately. Fortunately this is not too deep and I do think it can be managed but it was a new area that was not previously noted. Otherwise she has been tolerating the Dakinos soaked gauze packing without complication. 07/30/17 on evaluation today patient presents for reevaluation concerning her sacral wound. She has been tolerating the dressing changes without complication. With that being said her wound is doing so well at this point that I think she may be at the point where we could reinitiate the Wound VAC currently and hopefully see good results from this. I'm very pleased with how she has responded to the Dakinos soaked gauze packing. 08/13/17 evaluation today patient appears to be doing excellent in regard to her lower extremity ulcer this seems to be cleaning up very nicely. In regard to the sacral ulcer the area of trauma on the right lateral portion of the wound actually appears to be much better than previously noted during the last evaluation. The word has  definitely filled in and the Wound VAC appears to be helpful I do believe. Overall I'm very pleased with how things seem to be progressing at this time. Patient likewise is also happy that things are doing well. 08/27/17 on evaluation today patient presents for follow-up concerning her ongoing issues with her sacral ulcer and lower extremity ulcer. Fortunately she has been tolerating the dressing changes well complication the Wound VAC in general seems to have done very well up to this point. She does not have any evidence of infection which is good news. She does have some dark discoloration in central portion of the wound which is troublesome for the possibility of pressure injury to the site it sounds like her prior air mattress was not functioning properly her son has just bought a new one for her this is doing much better. Otherwise things seem to be progressing nicely. 09/10/17 on evaluation today patient presents for follow-up concerning her sacral wound and lower extremity ulcer. She has been tolerating the switch of her dressing changes to the Dakinos soaked gauze dressing very well in regard to the sacrum. The left lateral lower extremity ulcer appears to have a new area open just distal to the one that we have been treating with Santyl and this is new since her last visit. There does not appear to be any evidence of infection which is good news. With that being said there's a lot of maceration here at the site and I feel this is likely due to the switch and the dressings it appears that due to the sticking of the dressings it will switch to utilizing a telfa pad over the Santyl. I think this caused a lot of drainage to be collected and situated in the region just under the dressing which has led to this causing which duration of breakdown.  Overall there does not appear to be any significant pain which is good news. Of note I did actually have a conversation with the radiologist who is an  interventional radiologist with [facility]. 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 With that being said there does not appear to be any rush to get this done according to the interventional radiologist whom I spoke with. Therefore after discussion it was determined that there gonna wait about six months prior to considering the procedure to get time for hopefully the sacral wound to heal in the interim to at least some degree. 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 According to nursing staff these are all improved, the negative pressure wound therapy system was put on hold last week. We will resume negative pressure wound therapy today, continue with Santyl to the lower extremity and she will follow-up next week. 09/24/17 on evaluation today unfortunately the patient's wound appears to be doing significantly worse compared to last week's evaluation and even my valuation the week before. She actually has bone noted on the left wound margin in the region of undermining unfortunately. This was not noted during the last evaluation. I am concerned about the fact that this seems to be worse not better since our last visit with her. My biggest concern is that she's likely developing a worsening osteomyelitis. This was discussed with patient and her son today during the office visit. 05/03/17 on evaluation today patient actually appears to be doing rather well in regard to her sacral ulcer compared to last week's evaluation. She has been tolerating the dressing changes without complication. Fortunately even though we're not utilizing the Wound VAC she seems to be making some strides in seeing the wound area overall improved quite dramatically in my pinion in just one weeks time. She also seems to be staying off of this to the point that I do not see any evidence of  new injury which is excellent news. Whatever she's been doing in that regard over the past week I would want her to continue. I did also review the results of her bone culture which revealed group B strep as the responsible organism. Again this is what's causing her osteomyelitis she does have infectious disease appointment scheduled for 10/11/17 Electronic Signature(s) Signed: 10/04/2017 12:55:36 AM By: Lavella Lemons, [place]. ([id number]) Entered By: Lenda Kelp on 10/01/2017 17:26:47 Jessica [name] ([id number]) -------------------------------------------------------------------------------- Physical Exam Details [name]. Date of Service: 10/01/2017 11:15 AM Medical Record Number: [id number] Patient Account Number: [id number] Date of Birth/Sex: Dec 31, 1945 (72 y.o. F) Treating RN: Curtis Sites Primary Care Provider: Annita Brod Other Clinician: Referring Provider: Annita Brod Treating Provider/Extender: STONE III, HOYT Weeks in Treatment: 18 Constitutional Well-nourished and well-hydrated in no acute distress. Respiratory normal breathing without difficulty. Psychiatric this patient is able to make decisions and demonstrates good insight into disease process. Alert and Oriented x 3. pleasant and cooperative. Notes On inspection today patient's wound bed actually appears to again show mainly good granulation tissue at this point. With that being said she does have some evidence of bone noted on the periphery of the sacrum which was necrotic and again I did sharply debrided this away along with some of the necrotic dead tissue around the surface of the wound. She tolerated this well the entire wound did not require debridement however  in this is good news as the majority of the surface actually appear to be healthy and doing well. Electronic Signature(s) Signed: 10/04/2017 12:55:36 AM By: Lenda Kelp PA-C Entered By: Lenda Kelp on 10/01/2017 17:27:48 Jessica [name] ([id number]) -------------------------------------------------------------------------------- Physician Orders Details [name]. Date of Service: 10/01/2017 11:15 AM Medical Record Number: [id number] Patient Account Number: [id number] Date of Birth/Sex: Apr 24, 1945 (72 y.o. F) Treating RN: Curtis Sites Primary Care Provider: Annita Brod Other Clinician: Referring Provider: Annita Brod Treating Provider/Extender: Linwood Dibbles, HOYT Weeks in Treatment: 18 Verbal / Phone Orders: No Diagnosis Coding ICD-10 Coding Code Description L89.154 Pressure ulcer of sacral region, stage 4 M48.00 Spinal stenosis, site unspecified R54 Age-related physical debility G89.4 Chronic pain syndrome E78.2 Mixed hyperlipidemia L89.93 Pressure ulcer of unspecified site, stage 3 Wound Cleansing Wound #1 Medial Sacrum o Other: - please cleanse sacral wound with dakins moistened gauze - do not spray dakins on wound Wound #2 Left Lower Leg o Clean wound with Normal Saline. Anesthetic (add to Medication List) Wound #1 Medial Sacrum o Topical Lidocaine 4% cream applied to wound bed prior to debridement (In Clinic Only). Wound #2 Left Lower Leg o Topical Lidocaine 4% cream applied to wound bed prior to debridement (In Clinic Only). Primary Wound Dressing Wound #1 Medial Sacrum o Alginate Wound #2 Left Lower Leg o Silver Collagen Secondary Dressing Wound #2 Left Lower Leg o Boardered Foam Dressing - or ABD pad and tape Dressing Change Frequency Wound #1 Medial Sacrum o Change dressing every day. Wound #2 Left Lower Leg o Change dressing every day. Jessica [name], Jessica [name] ([id number]) Follow-up Appointments Wound #1 Medial Sacrum o Return Appointment in 1 week. Wound #2 Left Lower Leg o Return Appointment in 1 week. Off-Loading Wound #1 Medial Sacrum o Turn and reposition every 2 hours Wound #2 Left Lower  Leg o Turn and reposition every 2 hours Additional Orders / Instructions Wound #1 Medial Sacrum o Increase protein intake. Wound #2 Left Lower Leg o Increase protein intake. Home Health Wound #1 Medial Sacrum o Continue Home Health Visits - [facility] o Home Health Nurse may visit PRN to address patientos wound care needs. o FACE TO FACE ENCOUNTER: MEDICARE and MEDICAID PATIENTS: I certify that this patient is under my care and that I had a face-to-face encounter that meets the physician face-to-face encounter requirements with this patient on this date. The encounter with the patient was in whole or in part for the following MEDICAL CONDITION: (primary reason for Home Healthcare) MEDICAL NECESSITY: I certify, that based on my findings, NURSING services are a medically necessary home health service. HOME BOUND STATUS: I certify that my clinical findings support that this patient is homebound (i.e., Due to illness or injury, pt requires aid of supportive devices such as crutches, cane, wheelchairs, walkers, the use of special transportation or the assistance of another person to leave their place of residence. There is a normal inability to leave the home and doing so requires considerable and taxing effort. Other absences are for medical reasons / religious services and are infrequent or of short duration when for other reasons). o If current dressing causes regression in wound condition, may D/C ordered dressing product/s and apply Normal Saline Moist Dressing daily until next Wound Healing Center / Other MD appointment. Notify Wound Healing Center of regression in wound condition at [phone]. o Please direct any NON-WOUND related issues/requests for orders to patient's Primary Care Physician Wound #2 Left Lower Leg o Continue  Home Health Visits - [facility] o Home Health Nurse may visit PRN to address patientos wound care needs. o FACE TO FACE ENCOUNTER: MEDICARE  and MEDICAID PATIENTS: I certify that this patient is under my care and that I had a face-to-face encounter that meets the physician face-to-face encounter requirements with this patient on this date. The encounter with the patient was in whole or in part for the following MEDICAL CONDITION: (primary reason for Home Healthcare) MEDICAL NECESSITY: I certify, that based on my findings, NURSING services are a medically necessary home health service. HOME BOUND STATUS: I certify that my clinical findings support that this patient is homebound (i.e., Due to illness or injury, pt requires aid of supportive devices such as crutches, cane, wheelchairs, walkers, the use of special transportation or the assistance of another person to leave their place of residence. There is a normal inability to leave the home and doing so requires considerable and taxing effort. Other absences are for medical reasons / religious services and are infrequent or of short duration when for other reasons). o If current dressing causes regression in wound condition, may D/C ordered dressing product/s and apply Normal Saline Moist Dressing daily until next Wound Healing Center / Other MD appointment. Notify Wound Healing Center of regression in wound condition at [phone]. o Please direct any NON-WOUND related issues/requests for orders to patient's Primary Care Physician Jessica [name], Jessica [name]. ([id number]) Negative Pressure Wound Therapy Wound #1 Medial Sacrum o Place NPWT on HOLD. Electronic Signature(s) Signed: 10/01/2017 4:49:47 PM By: Curtis Sites Signed: 10/04/2017 12:55:36 AM By: Lenda Kelp PA-C Entered By: Curtis Sites on 10/01/2017 12:53:49 Jessica [name] ([id number]) -------------------------------------------------------------------------------- Problem List Details [name]. Date of Service: 10/01/2017 11:15 AM Medical Record Number: [id number] Patient Account Number:  [id number] Date of Birth/Sex: 1945/10/29 (72 y.o. F) Treating RN: Curtis Sites Primary Care Provider: Annita Brod Other Clinician: Referring Provider: Annita Brod Treating Provider/Extender: Linwood Dibbles, HOYT Weeks in Treatment: 18 Active Problems ICD-10 Evaluated Encounter Code Description Active Date Today Diagnosis L89.154 Pressure ulcer of sacral region, stage 4 05/27/2017 No Yes M48.00 Spinal stenosis, site unspecified 05/27/2017 No Yes R54 Age-related physical debility 05/27/2017 No Yes G89.4 Chronic pain syndrome 05/27/2017 No Yes E78.2 Mixed hyperlipidemia 05/27/2017 No Yes L89.93 Pressure ulcer of unspecified site, stage 3 05/27/2017 No Yes Inactive Problems Resolved Problems Electronic Signature(s) Signed: 10/04/2017 12:55:36 AM By: Lenda Kelp PA-C Entered By: Lenda Kelp on 10/01/2017 11:40:47 Jessica [name] ([id number]) -------------------------------------------------------------------------------- Progress Note Details [name]. Date of Service: 10/01/2017 11:15 AM Medical Record Number: [id number] Patient Account Number: [id number] Date of Birth/Sex: [birth year] (72 y.o. F) Treating RN: Curtis Sites Primary Care Provider: Annita Brod Other Clinician: Referring Provider: Annita Brod Treating Provider/Extender: Linwood Dibbles, HOYT Weeks in Treatment: 18 Subjective Chief Complaint Information obtained from Patient she is here for evaluation of a sacral ulcer and bilateral lower extremity ulcers History of Present Illness (HPI) 05/27/17-she is here in initial evaluation for a left-sided sacral stage IV pressure ulcer and bilateral lower extremity, lateral aspect, unstageable pressure ulcers. She is accompanied by her husband and her son, who are her primary caregivers. She is bedbound secondary to spinal stenosis. According to her son and husband she was hospitalized from 10/5-11/1 with healthcare associated pneumonia and altered  mental status. During her hospitalization she was intubated, extubated on 10/27. She was discharged with Foley catheter and follow up with urology. According to her son and spouse she developed the  sacral ulcer during hospitalization. Home health has been applying Santyl daily. She does have a low air loss mattress and is repositioned every 2-3 hours per her family's report. According to the son and spouse she had an appointment with urology on 12/18 and during that appointment developed discoloration to her bilateral lower extremities which ultimately developed into unstageable pressure ulcers to the lateral aspects of her bilateral lower extremities. There has been no topical treatment applied to these. She continues to have home health. There is no concerns expressed regarding dietary intake, stating she eats 3 meals a day, eating was provided; she is supplemented with boost with protein. 06/03/17-she is here in follow-up evaluation for sacral and bilateral lower extremity ulcers. Plain film x-ray done today reveals no distraction to the sacrum or coccyx, no visible abnormalities. Home health has ordered the negative pressure wound system but it has not been initiated. We will continue with Hydrofera Blue until initiation of negative pressure wound system and continue with Santyl to bilateral lower extremity ulcers. Follow-up next week 06/10/17-she is here in follow-up evaluation for sacral and bilateral lower extremity ulcers. The wound VAC will be available tomorrow per home health. We will initiate wound VAC therapy to the sacral ulcer 3 times weekly (Thursday, Saturday/Sunday, Tuesday). We will continue with Santyl to the lower extremity ulcers. The patient's son is checking into home health therapy over the [facility], with the understanding that if VAC changes cannot be performed over the weekend he will need to change his mother's appointments to Monday, Wednesday or Friday. The  sacral ulcer clinically does not appear infected but there has been a change in the amount of drainage acutely, there is no significant amount of devitalized tissue, there is no malodor. Wound culture was obtained to evaluate for occult infection we will hold off on antibiotic therapy until sensitivities are resulted. 06/18/17 on evaluation today patient appears to be doing fairly well in my opinion although this is the first time I have seen this patient she has been previously evaluated by Tacey RuizLeah here in our office. She is going to be switching to Fridays to see me due to the Wound VAC schedule being changed on Monday, Wednesday, and Friday. Subsequently she seems to be doing fairly well with the Wound VAC. Her son who was present during evaluation today states that he somewhat stresses of the Wound VAC in making sure that it was functioning appropriately. With that being said everything seems to be working well he knows what [facility] to look at and ensure that it is functioning properly. Overall the wound appears to be nice and clean there is no need for debridement today. She has no discomfort in her bilateral lower extremity ulcers also appear to be improving based on measurements and what this honest tell me about the overall appearance. 07/02/17 on evaluation today I noted in patients wound bed that was actually an odor that had not previously been noted. Subsequently there was also a small area of bone that was not previously noted during my last evaluation. This did appear to be necrotic and was being somewhat forced out by the body around the region of granulation. She does not have any pain which is good news. Nonetheless the overall appearance of the ulcer is making me concerned for the patient having developed osteomyelitis. Currently she is not been on any antibiotics and the Wound VAC has been doing fairly well in general. With that being said  I do not think we need to  continue the Wound VAC if she potentially has a bone infection. At least not until it is properly addressed with antibiotics. Jessica [name], Jessica [name] ([id number]) 07/09/17 on evaluation today patient appears to actually be doing very well in regard to her sacral ulcer compared to last week. There is actually no exposed bone at this point. 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 With that being said still I do believe she needs to have a referral to infectious disease due to the osteomyelitis I am gonna recommend that she continue with the doxycycline based on the culture results which showed Eikenella Corrodens as the organism identified that tetracycline should work for. She does not seem to be having any discomfort whatsoever at this point. 07/16/17 on evaluation today patient actually appears to be doing rather well in regard to her sacral wound. I think that the original wound site actually appears much better than previously noted. With that being said she does have a new superficial injury on the right sacral region which appears to be due to according to the sign transport that occurred for her MRI unfortunately. Fortunately this is not too deep and I do think it can be managed but it was a new area that was not previously noted. Otherwise she has been tolerating the Dakin s soaked gauze packing without complication. 07/30/17 on evaluation today patient presents for reevaluation concerning her sacral wound. She has been tolerating the dressing changes without complication. With that being said her wound is doing so well at this point that I think she may be at the point where we could reinitiate the Wound VAC currently and hopefully see good results from this. I'm very pleased with how she has responded to the [facility] s soaked gauze  packing. 08/13/17 evaluation today patient appears to be doing excellent in regard to her lower extremity ulcer this seems to be cleaning up very nicely. In regard to the sacral ulcer the area of trauma on the right lateral portion of the wound actually appears to be much better than previously noted during the last evaluation. The word has definitely filled in and the Wound VAC appears to be helpful I do believe. Overall I'm very pleased with how things seem to be progressing at this time. Patient likewise is also happy that things are doing well. 08/27/17 on evaluation today patient presents for follow-up concerning her ongoing issues with her sacral ulcer and lower extremity ulcer. Fortunately she has been tolerating the dressing changes well complication the Wound VAC in general seems to have done very well up to this point. She does not have any evidence of infection which is good news. She does have some dark discoloration in central portion of the wound which is troublesome for the possibility of pressure injury to the site it sounds like her prior air mattress was not functioning properly her son has just bought a new one for her this is doing much better. Otherwise things seem to be progressing nicely. 09/10/17 on evaluation today patient presents for follow-up concerning her sacral wound and lower extremity ulcer. She has been tolerating the switch of her dressing changes to the Dakin s soaked gauze dressing very well in regard to the sacrum. The left lateral lower extremity ulcer appears to have a new area open just  distal to the one that we have been treating with Santyl and this is new since her last visit. There does not appear to be any evidence of infection which is good news. With that being said there's a lot of maceration here at the site and I feel this is likely due to the switch and the dressings it appears that due to the sticking of the dressings it will switch to utilizing a  telfa pad over the Santyl. I think this caused a lot of drainage to be collected and situated in the region just under the dressing which has led to this causing which duration of breakdown. Overall there does not appear to be any significant pain which is good news. Of note I did actually have a conversation with the radiologist who is an interventional radiologist with [facility]. 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 With that being said there does not appear to be any rush to get this done according to the interventional radiologist whom I spoke with. Therefore after discussion it was determined that there gonna wait about six months prior to considering the procedure to get time for hopefully the sacral wound to heal in the interim to at least some degree. 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 According to nursing staff these are all improved, the negative pressure wound therapy system was put on hold last week. We will resume negative pressure wound therapy today, continue with Santyl to the lower extremity and she will follow-up next week. 09/24/17 on evaluation today unfortunately the patient's wound appears to be doing significantly worse compared to last week's evaluation and even my valuation the week before. She actually has bone noted on the left wound margin in the region of undermining unfortunately. This was not noted during the last evaluation. I am concerned about the fact that this seems to be worse not better since our last visit with her. My biggest concern is that she's likely developing a worsening osteomyelitis. This was discussed with patient and her son today during the office visit. 05/03/17 on evaluation today patient actually appears to be doing rather well in regard to her sacral ulcer compared to last week's evaluation. She has been  tolerating the dressing changes without complication. Fortunately even though we're not utilizing the Wound VAC she seems to be making some strides in seeing the wound area overall improved quite dramatically in my pinion in just one weeks time. She also seems to be staying off of this to the point that I do not see any evidence of new injury which is excellent news. Whatever she's been doing in that regard over the past week I would want her to continue. Jessica [name], Jessica [name] ([id number]) did also review the results of her bone culture which revealed group B strep as the responsible organism. Again this is what's causing her osteomyelitis she does have infectious disease appointment scheduled for 10/11/17 Patient History Information obtained from Patient. Family History No family history of Cancer, Diabetes, Heart Disease. Social History Never smoker, Marital Status - Married. Review of Systems (ROS) Constitutional Symptoms (General Health) Denies complaints or symptoms of Fever, Chills. Respiratory The patient has no complaints or symptoms. Cardiovascular The patient has no complaints or symptoms. Objective Constitutional Well-nourished and well-hydrated in no acute distress. Vitals Time Taken: 11:30 AM, Height: 70 in, Temperature: 98.25 F, Pulse: 79  bpm, Respiratory Rate: 18 breaths/min, Blood Pressure: 124/65 mmHg. Respiratory normal breathing without difficulty. Psychiatric this patient is able to make decisions and demonstrates good insight into disease process. Alert and Oriented x 3. pleasant and cooperative. General Notes: On inspection today patient's wound bed actually appears to again show mainly good granulation tissue at this point. With that being said she does have some evidence of bone noted on the periphery of the sacrum which was necrotic and again I did sharply debrided this away along with some of the necrotic dead tissue around the surface of the wound. She  tolerated this well the entire wound did not require debridement however in this is good news as the majority of the surface actually appear to be healthy and doing well. Integumentary (Hair, Skin) Wound #1 status is Open. Original cause of wound was Pressure Injury. The wound is located on the Medial Sacrum. The wound measures 7cm length x 6.5cm width x 2.3cm depth; 35.736cm^2 area and 82.192cm^3 volume. There is bone, muscle, and Fat Layer (Subcutaneous Tissue) Exposed exposed. There is no tunneling noted, however, there is undermining starting at 10:00 and ending at 1:00 with a maximum distance of 3.8cm. There is a large amount of purulent drainage noted. Foul odor after cleansing was noted. The wound margin is distinct with the outline attached to the wound base. There is medium (34-66%) red, pink, hyper - granulation within the wound bed. There is a medium (34-66%) amount of necrotic tissue Jessica [name], Jessica [name]. ([id number]) within the wound bed including Eschar and Adherent Slough. The periwound skin appearance exhibited: Induration, Scarring, Rubor, Erythema. The periwound skin appearance did not exhibit: Callus, Crepitus, Excoriation, Rash, Dry/Scaly, Maceration, Atrophie Blanche, Cyanosis, Ecchymosis, Hemosiderin Staining, Mottled, Pallor. The surrounding wound skin color is noted with erythema which is circumferential. Periwound temperature was noted as No Abnormality. The periwound has tenderness on palpation. Wound #2 status is Open. Original cause of wound was Gradually Appeared. The wound is located on the Left Lower Leg. The wound measures 0.8cm length x 1cm width x 0.3cm depth; 0.628cm^2 area and 0.188cm^3 volume. Assessment Active Problems ICD-10 Pressure ulcer of sacral region, stage 4 Spinal stenosis, site unspecified Age-related physical debility Chronic pain syndrome Mixed hyperlipidemia Pressure ulcer of unspecified site, stage 3 Procedures Wound #1 Pre-procedure  diagnosis of Wound #1 is a Pressure Ulcer located on the Medial Sacrum . There was a Excisional Skin/Subcutaneous Tissue/Muscle/Bone Debridement with a total area of 4 sq cm performed by STONE III, HOYT E., PA-C. With the following instrument(s): Curette to remove Viable and Non-Viable tissue/material. Material removed includes Bone,Subcutaneous Tissue, and Slough after achieving pain control using Lidocaine 4% Topical Solution. No specimens were taken. A time out was conducted at 11:51, prior to the start of the procedure. A Minimum amount of bleeding was controlled with Pressure. The procedure was tolerated well with a pain level of 0 throughout and a pain level of 0 following the procedure. Patient s Level of Consciousness post procedure was recorded as Awake and Alert. Post Debridement Measurements: 7cm length x 6.5cm width x 2.4cm depth; 85.765cm^3 volume. Post debridement Stage noted as Category/Stage IV. Character of Wound/Ulcer Post Debridement is improved. Post procedure Diagnosis Wound #1: Same as Pre-Procedure Plan Wound Cleansing: Wound #1 Medial Sacrum: Other: - please cleanse sacral wound with dakins moistened gauze - do not spray dakins on wound Wound #2 Left Lower Leg: Clean wound with Normal Saline. Anesthetic (add to Medication List): Wound #1 Medial SacrumJASMEN, Jessica [name] ([id number])  Topical Lidocaine 4% cream applied to wound bed prior to debridement (In Clinic Only). Wound #2 Left Lower Leg: Topical Lidocaine 4% cream applied to wound bed prior to debridement (In Clinic Only). Primary Wound Dressing: Wound #1 Medial Sacrum: Alginate Wound #2 Left Lower Leg: Silver Collagen Secondary Dressing: Wound #2 Left Lower Leg: Boardered Foam Dressing - or ABD pad and tape Dressing Change Frequency: Wound #1 Medial Sacrum: Change dressing every day. Wound #2 Left Lower Leg: Change dressing every day. Follow-up Appointments: Wound #1 Medial Sacrum: Return  Appointment in 1 week. Wound #2 Left Lower Leg: Return Appointment in 1 week. Off-Loading: Wound #1 Medial Sacrum: Turn and reposition every 2 hours Wound #2 Left Lower Leg: Turn and reposition every 2 hours Additional Orders / Instructions: Wound #1 Medial Sacrum: Increase protein intake. Wound #2 Left Lower Leg: Increase protein intake. Home Health: Wound #1 Medial Sacrum: Continue Home Health Visits - [facility] Nurse may visit PRN to address patient s wound care needs. FACE TO FACE ENCOUNTER: MEDICARE and MEDICAID PATIENTS: I certify that this patient is under my care and that I had a face-to-face encounter that meets the physician face-to-face encounter requirements with this patient on this date. The encounter with the patient was in whole or in part for the following MEDICAL CONDITION: (primary reason for Home Healthcare) MEDICAL NECESSITY: I certify, that based on my findings, NURSING services are a medically necessary home health service. HOME BOUND STATUS: I certify that my clinical findings support that this patient is homebound (i.e., Due to illness or injury, pt requires aid of supportive devices such as crutches, cane, wheelchairs, walkers, the use of special transportation or the assistance of another person to leave their place of residence. There is a normal inability to leave the home and doing so requires considerable and taxing effort. Other absences are for medical reasons / religious services and are infrequent or of short duration when for other reasons). If current dressing causes regression in wound condition, may D/C ordered dressing product/s and apply Normal Saline Moist Dressing daily until next Wound Healing Center / Other MD appointment. Notify Wound Healing Center of regression in wound condition at [phone]. Please direct any NON-WOUND related issues/requests for orders to patient's Primary Care Physician Wound #2 Left Lower Leg: Continue  Home Health Visits - [facility] Nurse may visit PRN to address patient s wound care needs. FACE TO FACE ENCOUNTER: MEDICARE and MEDICAID PATIENTS: I certify that this patient is under my care and that I had a face-to-face encounter that meets the physician face-to-face encounter requirements with this patient on this date. The encounter with the patient was in whole or in part for the following MEDICAL CONDITION: (primary reason for Home Healthcare) MEDICAL NECESSITY: I certify, that based on my findings, NURSING services are a medically necessary home health service. HOME BOUND STATUS: I certify that my clinical findings support that this patient is homebound (i.e., Due to illness or injury, pt requires aid of supportive devices such as crutches, cane, wheelchairs, walkers, the use of special transportation or the assistance of another person to leave their place of residence. There is a normal inability to leave the home and doing so requires considerable and taxing effort. Other absences are for medical reasons / religious services and are infrequent or of short duration when for other reasons). Jessica [name], Jessica [name] ([id number]) If current dressing causes regression in wound condition, may D/C ordered dressing product/s and apply Normal Saline Moist Dressing daily  until next Wound Healing Center / Other MD appointment. Notify Wound Healing Center of regression in wound condition at [phone]. Please direct any NON-WOUND related issues/requests for orders to patient's Primary Care Physician Negative Pressure Wound Therapy: Wound #1 Medial Sacrum: Place NPWT on HOLD. I'm gonna suggest currently that we continue with the Augmentin for this time and we will also continue to hold on the negative pressure wound therapy. She seems to be doing well with the Dakin's at this point. We will subsequently see were things stand at follow-up in one weeks time. In the meantime she will be seeing  Rexene Alberts who the nurse practitioner with infectious disease in [place] on 10/11/17. Please see above for specific wound care orders. We will see patient for re-evaluation in 1 week(s) here in the clinic. If anything worsens or changes patient will contact our office for additional recommendations. Electronic Signature(s) Signed: 10/04/2017 12:55:36 AM By: Lenda Kelp PA-C Entered By: Lenda Kelp on 10/01/2017 17:28:38 Jessica [name] ([id number]) -------------------------------------------------------------------------------- ROS/PFSH Details [name]. Date of Service: 10/01/2017 11:15 AM Medical Record Number: [id number] Patient Account Number: [id number] Date of Birth/Sex: [birth year] (72 y.o. F) Treating RN: Curtis Sites Primary Care Provider: Annita Brod Other Clinician: Referring Provider: Annita Brod Treating Provider/Extender: STONE III, HOYT Weeks in Treatment: 18 Information Obtained From Patient Wound History Constitutional Symptoms (General Health) Complaints and Symptoms: Negative for: Fever; Chills Respiratory Complaints and Symptoms: No Complaints or Symptoms Cardiovascular Complaints and Symptoms: No Complaints or Symptoms Immunizations Pneumococcal Vaccine: Received Pneumococcal Vaccination: No Implantable Devices Family and Social History Cancer: No; Diabetes: No; Heart Disease: No; Never smoker; Marital Status - Married Physician Affirmation I have reviewed and agree with the above information. Electronic Signature(s) Signed: 10/04/2017 12:55:36 AM By: Lenda Kelp PA-C Signed: 10/04/2017 4:53:29 PM By: Curtis Sites Entered By: Lenda Kelp on 10/01/2017 17:27:14 Jessica [name] ([id number]) -------------------------------------------------------------------------------- SuperBill Details [name]. Date of Service: 10/01/2017 Medical Record Number: [id number] Patient Account  Number: [id number] Date of Birth/Sex: [birth year] (72 y.o. F) Treating RN: Curtis Sites Primary Care Provider: Annita Brod Other Clinician: Referring Provider: Annita Brod Treating Provider/Extender: STONE III, HOYT Weeks in Treatment: 18 Diagnosis Coding ICD-10 Codes Code Description L89.154 Pressure ulcer of sacral region, stage 4 M48.00 Spinal stenosis, site unspecified R54 Age-related physical debility G89.4 Chronic pain syndrome E78.2 Mixed hyperlipidemia L89.93 Pressure ulcer of unspecified site, stage 3 Facility Procedures CPT4 Code: [id number] Description: 11044 - DEB BONE 20 SQ CM/< ICD-10 Diagnosis Description L89.154 Pressure ulcer of sacral region, stage 4 Modifier: Quantity: 1 Physician Procedures CPT4: Description Modifier Quantity Code [id number] Debridement; bone (includes epidermis, dermis, subQ tissue, muscle and/or fascia, if 1 performed) 1st 20 sqcm or less ICD-10 Diagnosis Description L89.154 Pressure ulcer of sacral region, stage 4 Electronic Signature(s) Signed: 10/04/2017 12:55:36 AM By: Lenda Kelp PA-C Entered By: Lenda Kelp on 10/01/2017 17:28:49

## 2017-10-08 ENCOUNTER — Encounter: Payer: Medicare Other | Admitting: Physician Assistant

## 2017-10-08 ENCOUNTER — Ambulatory Visit: Payer: Self-pay | Admitting: Urology

## 2017-10-08 DIAGNOSIS — L89154 Pressure ulcer of sacral region, stage 4: Secondary | ICD-10-CM | POA: Diagnosis not present

## 2017-10-10 NOTE — Progress Notes (Signed)
ZI, SEK ([id number]) Visit Report for 10/08/2017 Arrival Information Details Patient Name: RIKA, DAUGHDRILL. Date of Service: 10/08/2017 10:30 AM Medical Record Number: [id number] Patient Account Number: [id number] Date of Birth/Sex: Apr 17, 1945 (72 y.o. F) Treating RN: Renne Crigler Primary Care Huntleigh Doolen: Annita Brod Other Clinician: Referring Derya Dettmann: Annita Brod Treating Leveda Kendrix/Extender: Linwood Dibbles, HOYT Weeks in Treatment: 19 Visit Information History Since Last Visit Added or deleted any medications: No Patient Arrived: Stretcher Any new allergies or adverse reactions: No Arrival Time: 10:54 Had a fall or experienced change in No Accompanied By: son activities of daily living that may affect Transfer Assistance: Stretcher risk of falls: Patient Identification Verified: Yes Signs or symptoms of abuse/neglect since last visito No Secondary Verification Process Yes Hospitalized since last visit: No Completed: Implantable device outside of the clinic excluding No Patient Has Alerts: Yes cellular tissue based products placed in the center Patient Alerts: ALLERGIC TO since last visit: ZINC Has Dressing in Place as Prescribed: Yes Pain Present Now: No Electronic Signature(s) Signed: 10/08/2017 11:24:48 AM By: Renne Crigler Entered By: Renne Crigler on 10/08/2017 10:55:20 [name] ([id number]) -------------------------------------------------------------------------------- Clinic Level of Care Assessment Details Patient Name: [name]. Date of Service: 10/08/2017 10:30 AM Medical Record Number: [id number] Patient Account Number: [id number] Date of Birth/Sex: 1946/02/16 (72 y.o. F) Treating RN: Curtis Sites Primary Care Challis Crill: Annita Brod Other Clinician: Referring Pierson Vantol: Annita Brod Treating Buna Cuppett/Extender: Linwood Dibbles, HOYT Weeks in Treatment: 19 Clinic Level of Care Assessment Items TOOL 4 Quantity Score []  -  Use when only an EandM is performed on FOLLOW-UP visit 0 ASSESSMENTS - Nursing Assessment / Reassessment X - Reassessment of Co-morbidities (includes updates in patient status) 1 10 X- 1 5 Reassessment of Adherence to Treatment Plan ASSESSMENTS - Wound and Skin Assessment / Reassessment []  - Simple Wound Assessment / Reassessment - one wound 0 X- 2 5 Complex Wound Assessment / Reassessment - multiple wounds []  - 0 Dermatologic / Skin Assessment (not related to wound area) ASSESSMENTS - Focused Assessment []  - Circumferential Edema Measurements - multi extremities 0 []  - 0 Nutritional Assessment / Counseling / Intervention X- 1 5 Lower Extremity Assessment (monofilament, tuning fork, pulses) []  - 0 Peripheral Arterial Disease Assessment (using hand held doppler) ASSESSMENTS - Ostomy and/or Continence Assessment and Care []  - Incontinence Assessment and Management 0 []  - 0 Ostomy Care Assessment and Management (repouching, etc.) PROCESS - Coordination of Care X - Simple Patient / Family Education for ongoing care 1 15 []  - 0 Complex (extensive) Patient / Family Education for ongoing care []  - 0 Staff obtains Chiropractor, Records, Test Results / Process Orders []  - 0 Staff telephones HHA, Nursing Homes / Clarify orders / etc []  - 0 Routine Transfer to another Facility (non-emergent condition) []  - 0 Routine Hospital Admission (non-emergent condition) []  - 0 New Admissions / Manufacturing engineer / Ordering NPWT, Apligraf, etc. []  - 0 Emergency Hospital Admission (emergent condition) []  - 0 Simple Discharge Coordination DESTYNE, GOODREAU. ([id number]) X- 1 15 Complex (extensive) Discharge Coordination PROCESS - Special Needs []  - Pediatric / Minor Patient Management 0 []  - 0 Isolation Patient Management []  - 0 Hearing / Language / Visual special needs []  - 0 Assessment of Community assistance (transportation, D/C planning, etc.) []  - 0 Additional assistance / Altered  mentation []  - 0 Support Surface(s) Assessment (bed, cushion, seat, etc.) INTERVENTIONS - Wound Cleansing / Measurement []  - Simple Wound Cleansing - one wound 0 X- 2 5 Complex Wound Cleansing -  multiple wounds X- 1 5 Wound Imaging (photographs - any number of wounds) []  - 0 Wound Tracing (instead of photographs) []  - 0 Simple Wound Measurement - one wound X- 2 5 Complex Wound Measurement - multiple wounds INTERVENTIONS - Wound Dressings X - Small Wound Dressing one or multiple wounds 1 10 X- 1 15 Medium Wound Dressing one or multiple wounds []  - 0 Large Wound Dressing one or multiple wounds X- 1 5 Application of Medications - topical []  - 0 Application of Medications - injection INTERVENTIONS - Miscellaneous []  - External ear exam 0 []  - 0 Specimen Collection (cultures, biopsies, blood, body fluids, etc.) []  - 0 Specimen(s) / Culture(s) sent or taken to Lab for analysis []  - 0 Patient Transfer (multiple staff / Nurse, adult / Similar devices) []  - 0 Simple Staple / Suture removal (25 or less) []  - 0 Complex Staple / Suture removal (26 or more) []  - 0 Hypo / Hyperglycemic Management (close monitor of Blood Glucose) []  - 0 Ankle / Brachial Index (ABI) - do not check if billed separately X- 1 5 Vital Signs Gerdeman, Kristopher W. ([id number]) Has the patient been seen at the hospital within the last three years: Yes Total Score: 120 Level Of Care: ____ Electronic Signature(s) Signed: 10/08/2017 4:59:39 PM By: Curtis Sites Entered By: Curtis Sites on 10/08/2017 12:34:22 [name] ([id number]) -------------------------------------------------------------------------------- Encounter Discharge Information Details Patient Name: [name]. Date of Service: 10/08/2017 10:30 AM Medical Record Number: [id number] Patient Account Number: [id number] Date of Birth/Sex: 1946/04/05 (72 y.o. F) Treating RN: Huel Coventry Primary Care Jozelynn Danielson: Annita Brod Other  Clinician: Referring Jeremaine Maraj: Annita Brod Treating Laurieann Friddle/Extender: Linwood Dibbles, HOYT Weeks in Treatment: 19 Encounter Discharge Information Items Discharge Condition: Stable Ambulatory Status: Ambulatory Discharge Destination: Home Transportation: Private Auto Accompanied By: self Schedule Follow-up Appointment: Yes Clinical Summary of Care: Electronic Signature(s) Signed: 10/08/2017 12:00:47 PM By: Elliot Gurney, BSN, RN, CWS, Kim RN, BSN Entered By: Elliot Gurney, BSN, RN, CWS, Kim on 10/08/2017 12:00:47 [name] ([id number]) -------------------------------------------------------------------------------- Lower Extremity Assessment Details Patient Name: [name]. Date of Service: 10/08/2017 10:30 AM Medical Record Number: [id number] Patient Account Number: [id number] Date of Birth/Sex: 1946-02-04 (72 y.o. F) Treating RN: Renne Crigler Primary Care Jahsir Rama: Annita Brod Other Clinician: Referring Brayant Dorr: Annita Brod Treating Karlissa Aron/Extender: STONE III, HOYT Weeks in Treatment: 19 Edema Assessment Assessed: [Left: No] [Right: No] Edema: [Left: N] [Right: o] Vascular Assessment Claudication: Claudication Assessment [Left:None] Pulses: Dorsalis Pedis Palpable: [Left:Yes] Posterior Tibial Extremity colors, hair growth, and conditions: Extremity Color: [Left:Normal] Hair Growth on Extremity: [Left:No] Temperature of Extremity: [Left:Warm] Capillary Refill: [Left:< 3 seconds] Toe Nail Assessment Left: Right: Thick: Yes Discolored: Yes Deformed: No Improper Length and Hygiene: No Electronic Signature(s) Signed: 10/08/2017 11:24:48 AM By: Renne Crigler Entered By: Renne Crigler on 10/08/2017 11:04:41 [name] ([id number]) -------------------------------------------------------------------------------- Multi Wound Chart Details Patient Name: [name]. Date of Service: 10/08/2017 10:30 AM Medical Record Number:  [id number] Patient Account Number: [id number] Date of Birth/Sex: 1946/04/11 (72 y.o. F) Treating RN: Curtis Sites Primary Care Lakiah Dhingra: Annita Brod Other Clinician: Referring Darah Simkin: Annita Brod Treating Jillayne Witte/Extender: STONE III, HOYT Weeks in Treatment: 19 Vital Signs Height(in): 70 Pulse(bpm): 77 Weight(lbs): Blood Pressure(mmHg): 132/64 Body Mass Index(BMI): Temperature(F): 99.1 Respiratory Rate 18 (breaths/min): Photos: [1:No Photos] [2:No Photos] [N/A:N/A] Wound Location: [1:Sacrum - Medial] [2:Left Lower Leg] [N/A:N/A] Wounding Event: [1:Pressure Injury] [2:Gradually Appeared] [N/A:N/A] Primary Etiology: [1:Pressure Ulcer] [2:Pressure Ulcer] [N/A:N/A] Date Acquired: [1:01/15/2017] [2:04/28/2017] [N/A:N/A] Weeks of Treatment: [1:19] [2:19] [N/A:N/A]  Wound Status: [1:Open] [2:Open] [N/A:N/A] Measurements L x W x D [1:7.5x7.5x2.5] [2:1.2x0.6x0.2] [N/A:N/A] (cm) Area (cm) : [1:44.179] [2:0.565] [N/A:N/A] Volume (cm) : [1:110.447] [2:0.113] [N/A:N/A] % Reduction in Area: [1:-19.90%] [2:-20.00%] [N/A:N/A] % Reduction in Volume: [1:-199.70%] [2:-20.20%] [N/A:N/A] Starting Position 1 [1:8] (o'clock): Ending Position 1 [1:1] (o'clock): Maximum Distance 1 (cm): [1:4] Undermining: [1:Yes] [2:No] [N/A:N/A] Classification: [1:Category/Stage IV] [2:Category/Stage III] [N/A:N/A] Exudate Amount: [1:Large] [2:Large] [N/A:N/A] Exudate Type: [1:Purulent] [2:Serosanguineous] [N/A:N/A] Exudate Color: [1:yellow, brown, green] [2:red, brown] [N/A:N/A] Foul Odor After Cleansing: [1:Yes] [2:No] [N/A:N/A] Odor Anticipated Due to [1:No] [2:N/A] [N/A:N/A] Product Use: Wound Margin: [1:Distinct, outline attached] [2:Distinct, outline attached] [N/A:N/A] Granulation Amount: [1:Medium (34-66%)] [2:Medium (34-66%)] [N/A:N/A] Granulation Quality: [1:Red, Pink, Hyper-granulation] [2:Red, Pink] [N/A:N/A] Necrotic Amount: [1:Medium (34-66%)] [2:Medium (34-66%)] [N/A:N/A] Necrotic  Tissue: [1:Eschar, Adherent Slough] [2:Eschar, Adherent Slough] [N/A:N/A] Exposed Structures: [1:Fat Layer (Subcutaneous Tissue) Exposed: Yes Muscle: Yes Bone: Yes Fascia: No] [2:Fat Layer (Subcutaneous Tissue) Exposed: Yes Fascia: No Tendon: No Muscle: No] [N/A:N/A] Tendon: No Joint: No Joint: No Bone: No Epithelialization: None Small (1-33%) N/A Periwound Skin Texture: Induration: Yes Excoriation: No N/A Scarring: Yes Induration: No Excoriation: No Callus: No Callus: No Crepitus: No Crepitus: No Rash: No Rash: No Scarring: No Periwound Skin Moisture: Maceration: No Maceration: No N/A Dry/Scaly: No Dry/Scaly: No Periwound Skin Color: Erythema: Yes Atrophie Blanche: No N/A Rubor: Yes Cyanosis: No Atrophie Blanche: No Ecchymosis: No Cyanosis: No Erythema: No Ecchymosis: No Hemosiderin Staining: No Hemosiderin Staining: No Mottled: No Mottled: No Pallor: No Pallor: No Rubor: No Erythema Location: Circumferential N/A N/A Temperature: No Abnormality No Abnormality N/A Tenderness on Palpation: Yes No N/A Wound Preparation: Ulcer Cleansing: Ulcer Cleansing: N/A Rinsed/Irrigated with Saline Rinsed/Irrigated with Saline Topical Anesthetic Applied: Topical Anesthetic Applied: Other: lidocaine 4% Other: lidocaine 4% Treatment Notes Electronic Signature(s) Signed: 10/08/2017 4:59:39 PM By: Curtis Sites Entered By: Curtis Sites on 10/08/2017 11:19:06 [name] ([id number]) -------------------------------------------------------------------------------- Multi-Disciplinary Care Plan Details Patient Name: [name]. Date of Service: 10/08/2017 10:30 AM Medical Record Number: [id number] Patient Account Number: [id number] Date of Birth/Sex: [birth year] (72 y.o. F) Treating RN: Curtis Sites Primary Care Kallyn Demarcus: Annita Brod Other Clinician: Referring Tashari Schoenfelder: Annita Brod Treating Eniya Cannady/Extender: STONE III, HOYT Weeks in Treatment:  19 Active Inactive ` Orientation to the Wound Care Program Nursing Diagnoses: Knowledge deficit related to the wound healing center program Goals: Patient/caregiver will verbalize understanding of the Wound Healing Center Program Date Initiated: 06/03/2017 Target Resolution Date: 06/17/2017 Goal Status: Active Interventions: Provide education on orientation to the wound center Notes: ` Pressure Nursing Diagnoses: Knowledge deficit related to causes and risk factors for pressure ulcer development Knowledge deficit related to management of pressures ulcers Potential for impaired tissue integrity related to pressure, friction, moisture, and shear Goals: Patient will remain free from development of additional pressure ulcers Date Initiated: 06/03/2017 Target Resolution Date: 06/17/2017 Goal Status: Active Patient will remain free of pressure ulcers Date Initiated: 06/03/2017 Target Resolution Date: 06/17/2017 Goal Status: Active Patient/caregiver will verbalize risk factors for pressure ulcer development Date Initiated: 06/03/2017 Target Resolution Date: 06/17/2017 Goal Status: Active Interventions: Assess: immobility, friction, shearing, incontinence upon admission and as needed Assess potential for pressure ulcer upon admission and as needed Notes: ` Wound/Skin Impairment GHADEER, KASTELIC ([id number]) Nursing Diagnoses: Impaired tissue integrity Goals: Patient/caregiver will verbalize understanding of skin care regimen Date Initiated: 06/03/2017 Target Resolution Date: 06/17/2017 Goal Status: Active Ulcer/skin breakdown will have a volume reduction of 30% by week 4 Date Initiated: 06/03/2017 Target Resolution Date: 06/17/2017 Goal Status: Active  Interventions: Assess ulceration(s) every visit Treatment Activities: Patient referred to home care : 06/03/2017 Skin care regimen initiated : 06/03/2017 Notes: Electronic Signature(s) Signed: 10/08/2017 4:59:39 PM By: Curtis Sitesorthy,  Joanna Entered By: Curtis Sitesorthy, Joanna on 10/08/2017 11:18:57 [name], Nadene W. ([id number]) -------------------------------------------------------------------------------- Pain Assessment Details Patient Name: [name], Sakoya W. Date of Service: 10/08/2017 10:30 AM Medical Record Number: [id number] Patient Account Number: [id number] Date of Birth/Sex: 1945-10-25 (72 y.o. F) Treating RN: Renne CriglerFlinchum, Cheryl Primary Care Gareld Obrecht: Annita BrodASENSO, PHILIP Other Clinician: Referring Atziry Baranski: Annita BrodASENSO, PHILIP Treating Jamesia Linnen/Extender: STONE III, HOYT Weeks in Treatment: 19 Active Problems Location of Pain Severity and Description of Pain Patient Has Paino Patient Unable to Respond Site Locations Pain Management and Medication Current Pain Management: Electronic Signature(s) Signed: 10/08/2017 11:24:48 AM By: Renne CriglerFlinchum, Cheryl Entered By: Renne CriglerFlinchum, Cheryl on 10/08/2017 10:55:30 [name], Eilene W. ([id number]) -------------------------------------------------------------------------------- Patient/Caregiver Education Details Patient Name: [name], Hester W. Date of Service: 10/08/2017 10:30 AM Medical Record Number: [id number] Patient Account Number: [id number] Date of Birth/Gender: 1945-10-25 (72 y.o. F) Treating RN: Huel CoventryWoody, Kim Primary Care Physician: Annita BrodASENSO, PHILIP Other Clinician: Referring Physician: Annita BrodASENSO, PHILIP Treating Physician/Extender: Skeet SimmerSTONE III, HOYT Weeks in Treatment: 5219 Education Assessment Education Provided To: Patient Education Topics Provided Wound/Skin Impairment: Handouts: Caring for Your Ulcer Methods: Demonstration, Explain/Verbal Responses: State content correctly Electronic Signature(s) Signed: 10/08/2017 4:55:51 PM By: Elliot GurneyWoody, BSN, RN, CWS, Kim RN, BSN Entered By: Elliot GurneyWoody, BSN, RN, CWS, Kim on 10/08/2017 12:01:01 [name], Omaira W. ([id number]) -------------------------------------------------------------------------------- Wound Assessment Details Patient Name: [name],  Ulyssa W. Date of Service: 10/08/2017 10:30 AM Medical Record Number: [id number] Patient Account Number: [id number] Date of Birth/Sex: 1945-10-25 (72 y.o. F) Treating RN: Renne CriglerFlinchum, Cheryl Primary Care Kaylor Simenson: Annita BrodASENSO, PHILIP Other Clinician: Referring Leondre Taul: Annita BrodASENSO, PHILIP Treating  Paone/Extender: STONE III, HOYT Weeks in Treatment: 19 Wound Status Wound Number: 1 Primary Etiology: Pressure Ulcer Wound Location: Sacrum - Medial Wound Status: Open Wounding Event: Pressure Injury Date Acquired: 01/15/2017 Weeks Of Treatment: 19 Clustered Wound: No Photos Photo Uploaded By: Renne CriglerFlinchum, Cheryl on 10/08/2017 11:23:04 Wound Measurements Length: (cm) 7.5 Width: (cm) 7.5 Depth: (cm) 2.5 Area: (cm) 44.179 Volume: (cm) 110.447 % Reduction in Area: -19.9% % Reduction in Volume: -199.7% Epithelialization: None Tunneling: No Undermining: Yes Starting Position (o'clock): 8 Ending Position (o'clock): 1 Maximum Distance: (cm) 4 Wound Description Classification: Category/Stage IV Wound Margin: Distinct, outline attached Exudate Amount: Large Exudate Type: Purulent Exudate Color: yellow, brown, green Foul Odor After Cleansing: Yes Due to Product Use: No Slough/Fibrino Yes Wound Bed Granulation Amount: Medium (34-66%) Exposed Structure Granulation Quality: Red, Pink, Hyper-granulation Fascia Exposed: No Necrotic Amount: Medium (34-66%) Fat Layer (Subcutaneous Tissue) Exposed: Yes Necrotic Quality: Eschar, Adherent Slough Tendon Exposed: No Muscle Exposed: Yes Duard BradyOVERMAN, Dakiya W. ([id number]) Necrosis of Muscle: No Joint Exposed: No Bone Exposed: Yes Periwound Skin Texture Texture Color No Abnormalities Noted: No No Abnormalities Noted: No Callus: No Atrophie Blanche: No Crepitus: No Cyanosis: No Excoriation: No Ecchymosis: No Induration: Yes Erythema: Yes Rash: No Erythema Location: Circumferential Scarring: Yes Hemosiderin Staining: No Mottled:  No Moisture Pallor: No No Abnormalities Noted: No Rubor: Yes Dry / Scaly: No Maceration: No Temperature / Pain Temperature: No Abnormality Tenderness on Palpation: Yes Wound Preparation Ulcer Cleansing: Rinsed/Irrigated with Saline Topical Anesthetic Applied: Other: lidocaine 4%, Treatment Notes Wound #1 (Medial Sacrum) 1. Cleansed with: Clean wound with Normal Saline Notes ABD, dakins wet to dry gauze Electronic Signature(s) Signed: 10/08/2017 11:24:48 AM By: Renne CriglerFlinchum, Cheryl Entered By: Renne CriglerFlinchum, Cheryl on 10/08/2017 11:05:22 [name], Talissa W. ([id number]) -------------------------------------------------------------------------------- Wound Assessment Details Patient Name: Elwyn ReachVERMAN,  Adaliah W. Date of Service: 10/08/2017 10:30 AM Medical Record Number: [id number] Patient Account Number: [id number] Date of Birth/Sex: 03-02-1946 (72 y.o. F) Treating RN: Renne Crigler Primary Care Raji Glinski: Annita Brod Other Clinician: Referring Radonna Bracher: Annita Brod Treating Shyia Fillingim/Extender: STONE III, HOYT Weeks in Treatment: 19 Wound Status Wound Number: 2 Primary Etiology: Pressure Ulcer Wound Location: Left Lower Leg Wound Status: Open Wounding Event: Gradually Appeared Date Acquired: 04/28/2017 Weeks Of Treatment: 19 Clustered Wound: No Photos Photo Uploaded By: Renne Crigler on 10/08/2017 11:23:05 Wound Measurements Length: (cm) 1.2 Width: (cm) 0.6 Depth: (cm) 0.2 Area: (cm) 0.565 Volume: (cm) 0.113 % Reduction in Area: -20% % Reduction in Volume: -20.2% Epithelialization: Small (1-33%) Tunneling: No Undermining: No Wound Description Classification: Category/Stage III Wound Margin: Distinct, outline attached Exudate Amount: Large Exudate Type: Serosanguineous Exudate Color: red, brown Foul Odor After Cleansing: No Slough/Fibrino Yes Wound Bed Granulation Amount: Medium (34-66%) Exposed Structure Granulation Quality: Red, Pink Fascia Exposed:  No Necrotic Amount: Medium (34-66%) Fat Layer (Subcutaneous Tissue) Exposed: Yes Necrotic Quality: Eschar, Adherent Slough Tendon Exposed: No Muscle Exposed: No Joint Exposed: No Bone Exposed: No Periwound Skin Texture SHAQUELA, WEICHERT. ([id number]) Texture Color No Abnormalities Noted: No No Abnormalities Noted: No Callus: No Atrophie Blanche: No Crepitus: No Cyanosis: No Excoriation: No Ecchymosis: No Induration: No Erythema: No Rash: No Hemosiderin Staining: No Scarring: No Mottled: No Pallor: No Moisture Rubor: No No Abnormalities Noted: No Dry / Scaly: No Temperature / Pain Maceration: No Temperature: No Abnormality Wound Preparation Ulcer Cleansing: Rinsed/Irrigated with Saline Topical Anesthetic Applied: Other: lidocaine 4%, Treatment Notes Wound #2 (Left Lower Leg) 1. Cleansed with: Clean wound with Normal Saline 4. Dressing Applied: Prisma Ag 5. Secondary Dressing Applied ABD Pad Electronic Signature(s) Signed: 10/08/2017 11:24:48 AM By: Renne Crigler Entered By: Renne Crigler on 10/08/2017 11:06:15 [name] ([id number]) -------------------------------------------------------------------------------- Vitals Details Patient Name: [name]. Date of Service: 10/08/2017 10:30 AM Medical Record Number: [id number] Patient Account Number: [id number] Date of Birth/Sex: 1945/08/10 (72 y.o. F) Treating RN: Renne Crigler Primary Care Lukah Goswami: Annita Brod Other Clinician: Referring Merita Hawks: Annita Brod Treating Friend Dorfman/Extender: STONE III, HOYT Weeks in Treatment: 19 Vital Signs Time Taken: 10:50 Temperature (F): 99.1 Height (in): 70 Pulse (bpm): 77 Respiratory Rate (breaths/min): 18 Blood Pressure (mmHg): 132/64 Reference Range: 80 - 120 mg / dl Electronic Signature(s) Signed: 10/08/2017 11:24:48 AM By: Renne Crigler Entered By: Renne Crigler on 10/08/2017 10:56:03

## 2017-10-11 ENCOUNTER — Encounter: Payer: Self-pay | Admitting: Infectious Diseases

## 2017-10-11 ENCOUNTER — Ambulatory Visit (INDEPENDENT_AMBULATORY_CARE_PROVIDER_SITE_OTHER): Payer: Medicare Other | Admitting: Infectious Diseases

## 2017-10-11 VITALS — BP 125/75 | HR 79 | Temp 98.6°F

## 2017-10-11 DIAGNOSIS — R634 Abnormal weight loss: Secondary | ICD-10-CM

## 2017-10-11 DIAGNOSIS — M4628 Osteomyelitis of vertebra, sacral and sacrococcygeal region: Secondary | ICD-10-CM

## 2017-10-11 DIAGNOSIS — Z7401 Bed confinement status: Secondary | ICD-10-CM | POA: Diagnosis not present

## 2017-10-11 DIAGNOSIS — Z452 Encounter for adjustment and management of vascular access device: Secondary | ICD-10-CM | POA: Diagnosis not present

## 2017-10-11 DIAGNOSIS — L89154 Pressure ulcer of sacral region, stage 4: Secondary | ICD-10-CM | POA: Diagnosis not present

## 2017-10-11 DIAGNOSIS — B3749 Other urogenital candidiasis: Secondary | ICD-10-CM | POA: Diagnosis not present

## 2017-10-11 MED ORDER — AMOXICILLIN 500 MG PO CAPS: 500.0000 mg | ORAL_CAPSULE | Freq: Two times a day (BID) | ORAL | 0 refills | Status: DC

## 2017-10-11 NOTE — Assessment & Plan Note (Signed)
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 Discussed symptoms she has presented with in the past for UTI since she is not reliable to inform her care givers of abdominal pain or dysuria.

## 2017-10-11 NOTE — Assessment & Plan Note (Signed)
Discussed utility and necessity of low air-loss mattress. It seems she does not sit upright to where she has added pressure on sacral wound. I encouraged continued turning of her off this wound throughout the day to off-load. Suggested getting a wedge if they find the pillows become flat or don't hold her over enough.

## 2017-10-11 NOTE — Assessment & Plan Note (Addendum)
Underlying osteomyelitis (chronic) as indicated on sacral MRI in April of this year in the setting of chronic contiguous decubitus ulcer. No symptoms indicating she is acutely ill due to this contidition that would require urgent hospitalization today. Wound bed looks overall fairly free from adherent slough, no odor and some exposed bone. I am not certain if she underwent bone debridement during wound care sessions - often with sacral osteomyelitis debridement is critical to aid in likelihood to sterilize bone and increase likelihood for ong term cure. She has had partial treatment with doxycycline, however does not cover corynebacteria;  Augmentin does not offer adequate bone penetration but seems to be doing well for superficial wound.   Based on previous culture result will start Vancomycin + Ampicillin/Sulbactam today. This will cover the 3 recovered organisms as well as a good spectrum of gram negative organisms and anaerobic pathogens. One thing to keep in mind is candiduria - was incontinent of urine up until indwelling foley was placed; if wound does not show healing may need to consider adding fluconazole to treat presumed fungal component. She will continue to follow closely with [facility] and will reach out to [facility] to see if he can monitor this for me since it is burdensome for her to come to our clinic. We will see her back around 6 weeks of completing IV therapy with labs monitored through our OPAT program - orders below. Will also communicate this plan to Cassie and Pam Chandler with [facility] (existing clients).   Will plan on IR to place PICC line next week (per the patient's request). Their travel is burdensome and they prefer to have this procedure here in [place]. Will extend out the Augmentin 875/125 mg BID until we can get this done. I will check CMET, CBC, CRP, ESR today for treatment baseline and to establish kidney function - may need tunneled catheter if she has CKD3 or  greater. (ADDENDUM: 8:59 AM 10/12/2017 CrCl > 110 mL)  May require plastic surgery assistance after infection is cleared to heal this wound.    OPAT ORDERS:  Diagnosis: Chronic sacral osteomyelitis   Culture Result: eikenella, GBS, corynebacterium   Allergies  Allergen Reactions  . Ambien [Zolpidem Tartrate] Other (See Comments)    Reaction: altered mental status  . Zinc Other (See Comments)    Zinc ointments cause skin irritation    Discharge antibiotics: Per pharmacy protocol Vancomycin, trough 15-20   +  Ampicillin-Sulbactam 3gm IV Q4h   Duration: 6 - 8 weeks  End Date: TBD  PIC Care and Maintenance Per Protocol __ Please pull PIC at completion of IV antibiotics _x_ Please leave PIC in place until doctor has seen patient or been notified  Labs weekly while on IV antibiotics: _x_ CBC with differential _x_ BMP TWICE WEEKLY** _x_ CRP _x_ ESR _x_ Vancomycin troughs  Fax weekly labs to [phone]   

## 2017-10-11 NOTE — Progress Notes (Addendum)
Patient: Jessica [name]  DOB: 1945/10/11 MRN: [id number] PCP: Jessica Cuff, MD  Referring Provider: Jeri Cos, PA-C  Patient Active Problem List   Diagnosis Date Noted  . Weight loss 10/12/2017  . Needs peripherally inserted central catheter (PICC) 10/12/2017  . Subacute delirium 01/20/2017  . Decubitus ulcer of sacral region, stage 4 ([place]) 12/19/2016  . Candiduria, asymptomatic  12/18/2016  . Hallucinations 07/06/2016  . Sinus tachycardia 07/06/2016  . CONSTIPATION 07/08/2009  . DEEP VENOUS THROMBOPHLEBITIS, BILATERAL 01/07/2009  . Confined to bed 11/22/2008  . 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 HLD (hyperlipidemia) 12/10/2006  . ANXIETY 12/10/2006  . DEPRESSION 12/10/2006  . Essential hypertension 12/10/2006  . OSTEOARTHRITIS 12/10/2006  . Hypothyroidism 09/22/2006  . HYPERCHOLESTEROLEMIA 09/22/2006  . ECZEMA 09/22/2006     Subjective:   Chief Complaint  Patient presents with  . New Patient (Initial Visit)    Sacral osteomyelitis    Jessica [name] is a 72 y.o. Caucasian female referred here for evaluation of a left-sided healing sacral stage 4 pressure ulcer. Her past medical history is detailed below. Her husband and son are here to facilitate with the history and timeline of illness.   She has been in care at the [facility] with Jessica [name], [place] since February 2019. She is confined to her bed (13 years non-ambulatory per her son) secondary to spinal stenosis. She developed the sacral decub in the setting of prolonged hospitalization for [facility]CAP where she required intubation in October/November 2018. She uses a low air loss mattress at home and is repositioned frequently every 2-3 hours using pillows to reposition. She has also had bilateral deep tissue injuries to heels--son reports the right is nearly healed over and scabbed and left is responding nicely to wound care. She has in the past used negative pressure wound therapy to  sacral wound - this was discontinued for a few months as there was concern for osteomyelitis in March of this year. She was switched to Dakins soaked gauze packing which they continue using today. It seems she has had some bone cultures (vs superficial swabs?) done revealing eikenella, group b strepand corynebacterium. MRI in early April of this year revealed no drainable abscesses but inflammatory changes to the presacral fat and piriformis muscles as well as distal sacral/cocygeal osteomyelitis. She has been treated with "just shy of a month" of Doxycycline BID and is presently on Amoxicillin after bone swab culture 09/24/17 revealed group b strep and corynebacterium. GNRs on the gram stain were also present but without culture growth. On 09/24/17 per wound care records her wound had increased significantly with foul-smelling purulent drainage and new undermining at the wound boarders of which she is taking amoxicillin for - has completed 7 days of therapy and her family report that the drainage has cleared up and now only small clear/serous drainage with no odor and scant blood with daily dressing changes. She is tolerating the amoxicillin well without adverse effect (mild GI distress at start of taking medication). She previously had this listed as an allergy in EMR.   Both Jessica [name] and her care team deny any fevers/chills/sweats or worsened sacral pain (although she admits she cannot really "feel down there." She is not a diabetic and does not smoke cigarettes or use tobacco products. She eats full meals and they have recently added higher protein/calorie Ensure to help with healing this wound.  It seems that she has been diagnosed with cancer involving her kidney  and is due to undergo intervention through IR over the next 6 months. History of candiduria noted during previous hospitalization - candida parapsilosis per Jessica [name] note from last October. She now has chronically indwelling foley catheter  and is currently taking weekly fluconazole 150 mg to help "prevent UTIs" after she received daily course of treatment for candiduria.    Review of Systems  All other systems reviewed and are negative.   Past Medical History:  Diagnosis Date  . Chronic pain   . Depression   . Diastolic heart failure ([place])   . HLD (hyperlipidemia)   . HTN (hypertension)   . Hypothyroidism   . Morbid obesity ([place])   . Recurrent UTI   . Spinal stenosis   . Visual hallucinations     Outpatient Medications Prior to Visit  Medication Sig Dispense Refill  . acetaminophen (TYLENOL) 325 MG tablet Take 650 mg by mouth every 6 (six) hours as needed for mild pain.    Marland Kitchen albuterol (PROVENTIL HFA;VENTOLIN HFA) 108 (90 Base) MCG/ACT inhaler Inhale 2 puffs into the lungs every 4 (four) hours as needed for wheezing or shortness of breath.    Marland Kitchen albuterol (PROVENTIL) (2.5 MG/3ML) 0.083% nebulizer solution Take 2.5 mg by nebulization every 6 (six) hours as needed for wheezing or shortness of breath.    Marland Kitchen amlodipine-atorvastatin (CADUET) 2.5-10 MG tablet amlodipine 2.5 mg-atorvastatin 10 mg tablet  Take 1 tablet every day by oral route.    . ARIPiprazole (ABILIFY) 2 MG tablet 1 BY MOUTH ONCE A DAY FOR MOOD AND HALLUCINATIONS  4  . aspirin EC 81 MG tablet Take 81 mg by mouth daily.    . baclofen (LIORESAL) 20 MG tablet Take 5-20 mg by mouth 3 (three) times daily. 10 mg every morning and 20 mg every evening    . furosemide (LASIX) 20 MG tablet Take 20 mg by mouth daily.    Marland Kitchen levothyroxine (SYNTHROID, LEVOTHROID) 150 MCG tablet Take 150 mcg by mouth daily before breakfast.    . polyethylene glycol (MIRALAX / GLYCOLAX) packet Take 17 g by mouth daily.    . potassium chloride SA (K-DUR,KLOR-CON) 20 MEQ tablet Take 30 mEq by mouth daily.     . pregabalin (LYRICA) 150 MG capsule Take 150 mg by mouth 3 (three) times daily.    . Venlafaxine HCl 225 MG TB24 Take 225 mg by mouth daily.    . vitamin B-12 (CYANOCOBALAMIN) 1000 MCG  tablet Take 1,000 mcg by mouth daily.    . Vitamin D, Ergocalciferol, (DRISDOL) 50000 units CAPS capsule Take 50,000 Units by mouth every 30 (thirty) days.    . fluconazole (DIFLUCAN) 150 MG tablet TAKE 1 TABLET BY MOUTH WEEKLY FOR PREVENTION OF UTI FROM YEAST-START AFTER 2 WEEKS OF DAILY FLUCONAZ  1  . collagenase (SANTYL) ointment Apply topically daily. (Patient not taking: Reported on 10/11/2017) 15 g 0  . liver oil-zinc oxide (DESITIN) 40 % ointment Apply 1 application topically as needed (affected area). (Patient not taking: Reported on 09/01/2017) 56.7 g 0   No facility-administered medications prior to visit.      Allergies  Allergen Reactions  . Ambien [Zolpidem Tartrate] Other (See Comments)    Reaction: altered mental status  . Zinc Other (See Comments)    Zinc ointments cause skin irritation    Social History   Tobacco Use  . Smoking status: Never Smoker  . Smokeless tobacco: Former Systems developer    Types: Snuff  Substance Use Topics  . Alcohol use:  No  . Drug use: No    Family History  Problem Relation Age of Onset  . Heart disease Father   . Deep vein thrombosis Father     Objective:   Vitals:   10/11/17 1339  BP: 125/75  Pulse: 79  Temp: 98.6 F (37 C)  TempSrc: Oral   There is no height or weight on file to calculate BMI.   Wt Readings from Last 3 Encounters:  09/01/17 185 lb (83.9 kg)  02/08/17 198 lb 14.4 oz (90.2 kg)  01/01/17 205 lb (93 kg)    Physical Exam  Constitutional: She is oriented to person, place, and time. She appears well-developed and well-nourished.  Elderly appearing woman resting on the stretcher today. Her son and husband accompany her.   HENT:  Mouth/Throat: Oropharynx is clear and moist and mucous membranes are normal. No oral lesions. Normal dentition. No dental abscesses.  Cardiovascular: Normal rate, regular rhythm and normal heart sounds.  Pulmonary/Chest: Effort normal and breath sounds normal.  Abdominal: Soft. She exhibits no  distension. There is no tenderness.  Musculoskeletal: She exhibits no edema.  Insensate to lower extremities  Lymphadenopathy:    She has no cervical adenopathy.  Neurological: She is alert and oriented to person, place, and time.  Skin: Skin is warm and dry. No rash noted.  Sacral wound pictured and described below. Did not examine peripheral heel ulcers today.   Psychiatric: She has a normal mood and affect. Judgment normal.   Sacral decubitus: 40% yellow slough, 60% red/pink. Exposed bone palpated. Wound edges with undermining at 10 o'clock position. Moisture associated damage to 4/5 o'clock position. No odor today. Scant serosanguinous drainage. Re-packed with moistened 4x4's and abd pad.     Lab Results: Lab Results  Component Value Date   WBC 11.5 (H) 10/11/2017   HGB 10.8 (L) 10/11/2017   HCT 32.8 (L) 10/11/2017   MCV 87.2 10/11/2017   PLT 342 10/11/2017    Lab Results  Component Value Date   CREATININE 0.54 (L) 10/11/2017   BUN 11 10/11/2017   NA 139 10/11/2017   K 4.4 10/11/2017   CL 101 10/11/2017   CO2 32 10/11/2017    Lab Results  Component Value Date   ALT 17 10/11/2017   AST 35 10/11/2017   ALKPHOS 108 02/04/2017   BILITOT 0.3 10/11/2017     Assessment & Plan:   Problem List Items Addressed This Visit      Musculoskeletal and Integument   Decubitus ulcer of sacral region, stage 4 ([place])    Underlying osteomyelitis (chronic) as indicated on sacral MRI in April of this year in the setting of chronic contiguous decubitus ulcer. No symptoms indicating she is acutely ill due to this contidition that would require urgent hospitalization today. Wound bed looks overall fairly free from adherent slough, no odor and some exposed bone. I am not certain if she underwent bone debridement during wound care sessions - often with sacral osteomyelitis debridement is critical to aid in likelihood to sterilize bone and increase likelihood for ong term cure. She has had partial  treatment with doxycycline, however does not cover corynebacteria;  Augmentin does not offer adequate bone penetration but seems to be doing well for superficial wound.   Based on previous culture result will start Vancomycin + Ampicillin/Sulbactam today. This will cover the 3 recovered organisms as well as a good spectrum of gram negative organisms and anaerobic pathogens. One thing to keep in mind is candiduria - was  incontinent of urine up until indwelling foley was placed; if wound does not show healing may need to consider adding fluconazole to treat presumed fungal component. She will continue to follow closely with [facility] and will reach out to [facility] to see if he can monitor this for me since it is burdensome for her to come to our clinic. We will see her back around 6 weeks of completing IV therapy with labs monitored through our OPAT program - orders below. Will also communicate this plan to Cassie and Carolynn Sayers with [facility] (existing clients).   Will plan on IR to place PICC line next week (per the patient's request). Their travel is burdensome and they prefer to have this procedure here in [place]. Will extend out the Augmentin 875/125 mg BID until we can get this done. I will check CMET, CBC, CRP, ESR today for treatment baseline and to establish kidney function - may need tunneled catheter if she has CKD3 or greater. (ADDENDUM: 8:59 AM 10/12/2017 CrCl > 110 mL)  May require plastic surgery assistance after infection is cleared to heal this wound.    OPAT ORDERS:  Diagnosis: Chronic sacral osteomyelitis   Culture Result: eikenella, GBS, corynebacterium   Allergies  Allergen Reactions  . Ambien [Zolpidem Tartrate] Other (See Comments)    Reaction: altered mental status  . Zinc Other (See Comments)    Zinc ointments cause skin irritation    Discharge antibiotics: Per pharmacy protocol Vancomycin, trough 15-20   +  Ampicillin-Sulbactam 3gm IV Q4h   Duration: 6 - 8  weeks  End Date: TBD  PIC Care and Maintenance Per Protocol __ Please pull PIC at completion of IV antibiotics _x_ Please leave PIC in place until doctor has seen patient or been notified  Labs weekly while on IV antibiotics: _x_ CBC with differential _x_ BMP TWICE WEEKLY** _x_ CRP _x_ ESR _x_ Vancomycin troughs  Fax weekly labs to [phone]          Genitourinary   Candiduria, asymptomatic     Would stop weekly fluconazole therapy as preventative tactic as this is more likely to breed a resistant organism considering she is certainly colonized with indwelling urinary catheter. Discussed symptoms she has presented with in the past for UTI since she is not reliable to inform her care givers of abdominal pain or dysuria.         Other   Weight loss    Wt Readings from Last 3 Encounters:  09/01/17 185 lb (83.9 kg)  02/08/17 198 lb 14.4 oz (90.2 kg)  01/01/17 205 lb (93 kg)   Patient with 30# weight loss since September 2018. She would benefit from nutrition referral to ensure that her dietary intake is adequate to support her healing.       Needs peripherally inserted central catheter (PICC)    Insert PICC next week.       Relevant Orders   IRPICC PLACEMENT LEFT >5 YRS INC IMG GUIDE   Confined to bed    Discussed utility and necessity of low air-loss mattress. It seems she does not sit upright to where she has added pressure on sacral wound. I encouraged continued turning of her off this wound throughout the day to off-load. Suggested getting a wedge if they find the pillows become flat or don't hold her over enough.       Other Visit Diagnoses    Sacral osteomyelitis ([place])    -  Primary   Relevant Orders  CBC with Differential/Platelet (Completed)   Comprehensive metabolic panel (Completed)   C-reactive protein   Sedimentation rate (Completed)     I spent 60 minutes with the patient including greater than 50% of time in face to face counsel of the  patient re osteomyelitis natural progression, PICC line, counseling on antibiotics, dietary recall, wound care provided by myself and in coordination of her care.  Janene Madeira, MSN, NP-C [facility] for Infect[facility] [place] Pager: [phone] Office: [phone]  10/12/17  9:48 AM

## 2017-10-11 NOTE — Patient Instructions (Addendum)
Will continue your Amoxicillin twice a day for now until we get your PICC Line placed.   Will start you on antibiotics for your bone infection - this will require 6 weeks usually of IV therapy at home to treat the infection.   Will see you back prior to removing your PICC line to see how you are doing - in the mean time continue to work with [facility] to monitor the wound.   Will set up for IR to place PICC line next week.   Will check some baseline blood work today for you so we can make a decision about your medications.   PICC Home Guide A peripherally inserted central catheter (PICC) is a long, thin, flexible tube that is inserted into a vein in the upper arm. It is a form of intravenous (IV) access. It is considered to be a "central" line because the tip of the PICC ends in a large vein in your chest. This large vein is called the superior vena cava (SVC). The PICC tip ends in the SVC because there is a lot of blood flow in the SVC. This allows medicines and IV fluids to be quickly distributed throughout the body. The PICC is inserted using a sterile technique by a specially trained nurse or physician. After the PICC is inserted, a chest X-ray exam is done to be sure it is in the correct place. A PICC may be placed for different reasons, such as:  To give medicines and liquid nutrition that can only be given through a central line. Examples are: ? Certain antibiotic treatments. ? Chemotherapy. ? Total parenteral nutrition (TPN).  To take frequent blood samples.  To give IV fluids and blood products.  If there is difficulty placing a peripheral intravenous (PIV) catheter.  If taken care of properly, a PICC can remain in place for several months. A PICC can also allow a person to go home from the hospital early. Medicine and PICC care can be managed at home by a family member or home health care team. What problems can happen when I have a PICC? Problems with a PICC can occasionally occur.  These may include the following:  A blood clot (thrombus) forming in or at the tip of the PICC. This can cause the PICC to become clogged. A clot-dissolving medicine called tissue plasminogen activator (tPA) can be given through the PICC to help break up the clot.  Inflammation of the vein (phlebitis) in which the PICC is placed. Signs of inflammation may include redness, pain at the insertion site, red streaks, or being able to feel a "cord" in the vein where the PICC is located.  Infection in the PICC or at the insertion site. Signs of infection may include fever, chills, redness, swelling, or pus drainage from the PICC insertion site.  PICC movement (malposition). The PICC tip may move from its original position due to excessive physical activity, forceful coughing, sneezing, or vomiting.  A break or cut in the PICC. It is important to not use scissors near the PICC.  Nerve or tendon irritation or injury during PICC insertion.  What should I keep in mind about activities when I have a PICC?  You may bend your arm and move it freely. If your PICC is near or at the bend of your elbow, avoid activity with repeated motion at the elbow.  Rest at home for the remainder of the day following PICC line insertion.  Avoid lifting heavy objects as instructed by  your health care provider.  Avoid using a crutch with the arm on the same side as your PICC. You may need to use a walker. What should I know about my PICC dressing?  Keep your PICC bandage (dressing) clean and dry to prevent infection. ? Ask your health care provider when you may shower. Ask your health care provider to teach you how to wrap the PICC when you do take a shower.  Change the PICC dressing as instructed by your health care provider.  Change your PICC dressing if it becomes loose or wet. What should I know about PICC care?  Check the PICC insertion site daily for leakage, redness, swelling, or pain.  Do not take a bath,  swim, or use hot tubs when you have a PICC. Cover PICC line with clear plastic wrap and tape to keep it dry while showering.  Flush the PICC as directed by your health care provider. Let your health care provider know right away if the PICC is difficult to flush or does not flush. Do not use force to flush the PICC.  Do not use a syringe that is less than 10 mL to flush the PICC.  Never pull or tug on the PICC.  Avoid blood pressure checks on the arm with the PICC.  Keep your PICC identification card with you at all times.  Do not take the PICC out yourself. Only a trained clinical professional should remove the PICC. Get help right away if:  Your PICC is accidentally pulled all the way out. If this happens, cover the insertion site with a bandage or gauze dressing. Do not throw the PICC away. Your health care provider will need to inspect it.  Your PICC was tugged or pulled and has partially come out. Do not  push the PICC back in.  There is any type of drainage, redness, or swelling where the PICC enters the skin.  You cannot flush the PICC, it is difficult to flush, or the PICC leaks around the insertion site when it is flushed.  You hear a "flushing" sound when the PICC is flushed.  You have pain, discomfort, or numbness in your arm, shoulder, or jaw on the same side as the PICC.  You feel your heart "racing" or skipping beats.  You notice a hole or tear in the PICC.  You develop chills or a fever. This information is not intended to replace advice given to you by your health care provider. Make sure you discuss any questions you have with your health care provider. Document Released: 10/04/2002 Document Revised: 10/18/2015 Document Reviewed: 01/20/2013 Elsevier Interactive Patient Education  2017 [employer].

## 2017-10-12 ENCOUNTER — Telehealth: Payer: Self-pay | Admitting: *Deleted

## 2017-10-12 DIAGNOSIS — R634 Abnormal weight loss: Secondary | ICD-10-CM | POA: Insufficient documentation

## 2017-10-12 DIAGNOSIS — Z452 Encounter for adjustment and management of vascular access device: Secondary | ICD-10-CM | POA: Insufficient documentation

## 2017-10-12 LAB — COMPREHENSIVE METABOLIC PANEL
AG Ratio: 0.7 (calc) — ABNORMAL LOW (ref 1.0–2.5)
ALT: 17 U/L (ref 6–29)
AST: 35 U/L (ref 10–35)
Albumin: 2.5 g/dL — ABNORMAL LOW (ref 3.6–5.1)
Alkaline phosphatase (APISO): 171 U/L — ABNORMAL HIGH (ref 33–130)
BUN/Creatinine Ratio: 20 (calc) (ref 6–22)
BUN: 11 mg/dL (ref 7–25)
CO2: 32 mmol/L (ref 20–32)
Calcium: 8.7 mg/dL (ref 8.6–10.4)
Chloride: 101 mmol/L (ref 98–110)
Creat: 0.54 mg/dL — ABNORMAL LOW (ref 0.60–0.93)
Globulin: 3.6 g/dL (calc) (ref 1.9–3.7)
Glucose, Bld: 122 mg/dL — ABNORMAL HIGH (ref 65–99)
Potassium: 4.4 mmol/L (ref 3.5–5.3)
Sodium: 139 mmol/L (ref 135–146)
Total Bilirubin: 0.3 mg/dL (ref 0.2–1.2)
Total Protein: 6.1 g/dL (ref 6.1–8.1)

## 2017-10-12 LAB — CBC WITH DIFFERENTIAL/PLATELET
Basophils Absolute: 69 cells/uL (ref 0–200)
Basophils Relative: 0.6 %
Eosinophils Absolute: 828 cells/uL — ABNORMAL HIGH (ref 15–500)
Eosinophils Relative: 7.2 %
HCT: 32.8 % — ABNORMAL LOW (ref 35.0–45.0)
Hemoglobin: 10.8 g/dL — ABNORMAL LOW (ref 11.7–15.5)
Lymphs Abs: 2599 cells/uL (ref 850–3900)
MCH: 28.7 pg (ref 27.0–33.0)
MCHC: 32.9 g/dL (ref 32.0–36.0)
MCV: 87.2 fL (ref 80.0–100.0)
MPV: 10.6 fL (ref 7.5–12.5)
Monocytes Relative: 7.6 %
Neutro Abs: 7130 cells/uL (ref 1500–7800)
Neutrophils Relative %: 62 %
Platelets: 342 10*3/uL (ref 140–400)
RBC: 3.76 10*6/uL — ABNORMAL LOW (ref 3.80–5.10)
RDW: 13.5 % (ref 11.0–15.0)
Total Lymphocyte: 22.6 %
WBC mixed population: 874 cells/uL (ref 200–950)
WBC: 11.5 10*3/uL — ABNORMAL HIGH (ref 3.8–10.8)

## 2017-10-12 LAB — SEDIMENTATION RATE: Sed Rate: 56 mm/h — ABNORMAL HIGH (ref 0–30)

## 2017-10-12 LAB — C-REACTIVE PROTEIN: CRP: 27.3 mg/L — ABNORMAL HIGH (ref <8.0)

## 2017-10-12 MED ORDER — AMOXICILLIN-POT CLAVULANATE 875-125 MG PO TABS: 1.0000 | ORAL_TABLET | Freq: Two times a day (BID) | ORAL | 0 refills | Status: AC

## 2017-10-12 NOTE — Assessment & Plan Note (Signed)
Wt Readings from Last 3 Encounters:  09/01/17 185 lb (83.9 kg)  02/08/17 198 lb 14.4 oz (90.2 kg)  01/01/17 205 lb (93 kg)   Patient with 30# weight loss since September 2018. She would benefit from nutrition referral to ensure that her dietary intake is adequate to support her healing.

## 2017-10-12 NOTE — Addendum Note (Signed)
Addended by: Blanchard KelchIXON, Aailyah Dunbar N on: 10/12/2017 09:48 AM   Modules accepted: Orders

## 2017-10-12 NOTE — Progress Notes (Signed)
Jessica [name] ([id number]) Visit Report for 10/08/2017 Chief Complaint Document Details [name], Jessica [name]. Date of Service: 10/08/2017 10:30 AM Medical Record Number: [id number] Patient Account Number: [id number] Date of Birth/Sex: 06-06-1945 (72 y.o. F) Treating RN: Curtis Sites Primary Care Provider: Annita Brod Other Clinician: Referring Provider: Annita Brod Treating Provider/Extender: STONE III, Davin Archuletta Weeks in Treatment: 19 Information Obtained from: Patient Chief Complaint she is here for evaluation of a sacral ulcer and bilateral lower extremity ulcers Electronic Signature(s) Signed: 10/09/2017 12:34:34 PM By: Lenda Kelp PA-C Entered By: Lenda Kelp on 10/08/2017 10:52:11 Jessica [name] ([id number]) -------------------------------------------------------------------------------- HPI Details [name]. Date of Service: 10/08/2017 10:30 AM Medical Record Number: [id number] Patient Account Number: [id number] Date of Birth/Sex: 01-15-1946 (72 y.o. F) Treating RN: Curtis Sites Primary Care Provider: Annita Brod Other Clinician: Referring Provider: Annita Brod Treating Provider/Extender: Linwood Dibbles, Latanya Hemmer Weeks in Treatment: 19 History of Present Illness HPI Description: 05/27/17-she is here in initial evaluation for a left-sided sacral stage IV pressure ulcer and bilateral lower extremity, lateral aspect, unstageable pressure ulcers. She is accompanied by her husband and her son, who are her primary caregivers. She is bedbound secondary to spinal stenosis. According to her son and husband she was hospitalized from 10/5-11/1 with healthcare associated pneumonia and altered mental status. During her hospitalization she was intubated, extubated on 10/27. She was discharged with Foley catheter and follow up with urology. According to her son and spouse she developed the sacral ulcer during hospitalization. Home health has  been applying Santyl daily. She does have a low air loss mattress and is repositioned every 2-3 hours per her family's report. According to the son and spouse she had an appointment with urology on 12/18 and during that appointment developed discoloration to her bilateral lower extremities which ultimately developed into unstageable pressure ulcers to the lateral aspects of her bilateral lower extremities. There has been no topical treatment applied to these. She continues to have home health. There is no concerns expressed regarding dietary intake, stating she eats 3 meals a day, eating was provided; she is supplemented with boost with protein. 06/03/17-she is here in follow-up evaluation for sacral and bilateral lower extremity ulcers. Plain film x-ray done today reveals no distraction to the sacrum or coccyx, no visible abnormalities. Home health has ordered the negative pressure wound system but it has not been initiated. We will continue with Hydrofera Blue until initiation of negative pressure wound system and continue with Santyl to bilateral lower extremity ulcers. Follow-up next week 06/10/17-she is here in follow-up evaluation for sacral and bilateral lower extremity ulcers. The wound VAC will be available tomorrow per home health. We will initiate wound VAC therapy to the sacral ulcer 3 times weekly (Thursday, Saturday/Sunday, Tuesday). We will continue with Santyl to the lower extremity ulcers. The patient's son is checking into home health therapy over the weekend for [facility] changes, with the understanding that if VAC changes cannot be performed over the weekend he will need to change his mother's appointments to Monday, Wednesday or Friday. The sacral ulcer clinically does not appear infected but there has been a change in the amount of drainage acutely, there is no significant amount of devitalized tissue, there is no malodor. Wound culture was obtained to evaluate for occult infection we  will hold off on antibiotic therapy until sensitivities are resulted. 06/18/17 on evaluation today patient appears to be doing fairly well in my opinion although this is the first time I  have seen this patient she has been previously evaluated by Tacey RuizLeah here in our office. She is going to be switching to Fridays to see me due to the Wound VAC schedule being changed on Monday, Wednesday, and Friday. Subsequently she seems to be doing fairly well with the Wound VAC. Her son who was present during evaluation today states that he somewhat stresses of the Wound VAC in making sure that it was functioning appropriately. With that being said everything seems to be working well he knows what settings on the [facility] HVAC itself to look at and ensure that it is functioning properly. Overall the wound appears to be nice and clean there is no need for debridement today. She has no discomfort in her bilateral lower extremity ulcers also appear to be improving based on measurements and what this honest tell me about the overall appearance. 07/02/17 on evaluation today I noted in patients wound bed that was actually an odor that had not previously been noted. Subsequently there was also a small area of bone that was not previously noted during my last evaluation. This did appear to be necrotic and was being somewhat forced out by the body around the region of granulation. She does not have any pain which is good news. Nonetheless the overall appearance of the ulcer is making me concerned for the patient having developed osteomyelitis. Currently she is not been on any antibiotics and the Wound VAC has been doing fairly well in general. With that being said I do not think we need to continue the Wound VAC if she potentially has a bone infection. At least not until it is properly addressed with antibiotics. 07/09/17 on evaluation today patient appears to actually be doing very well in regard to her sacral ulcer compared to last  week. There is actually no exposed bone at this point. 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 With that being said still I do believe she needs to have a referral to infectious disease due to the osteomyelitis I am gonna recommend that she continue with the doxycycline based on the culture results which showed Eikenella Corrodens as the Jessica BradyOVERMAN, [name] ([id number]) organism identified that tetracycline should work for. She does not seem to be having any discomfort whatsoever at this point. 07/16/17 on evaluation today patient actually appears to be doing rather well in regard to her sacral wound. I think that the original wound site actually appears much better than previously noted. With that being said she does have a new superficial injury on the right sacral region which appears to be due to according to the sign transport that occurred for her MRI unfortunately. Fortunately this is not too deep and I do think it can be managed but it was a new area that was not previously noted. Otherwise she has been tolerating the Dakinos soaked gauze packing without complication. 07/30/17 on evaluation today patient presents for reevaluation concerning her sacral wound. She has been tolerating the dressing changes without complication. With that being said her wound is doing so well at this point that I think she may be at the point where we could reinitiate the Wound VAC currently and hopefully see good results from this. I'm very pleased with how she has responded to the Dakinos soaked gauze packing. 08/13/17 evaluation today patient appears to be doing excellent in  regard to her lower extremity ulcer this seems to be cleaning up very nicely. In regard to the sacral ulcer the area of trauma on the right lateral portion of the wound actually appears  to be much better than previously noted during the last evaluation. The word has definitely filled in and the Wound VAC appears to be helpful I do believe. Overall I'm very pleased with how things seem to be progressing at this time. Patient likewise is also happy that things are doing well. 08/27/17 on evaluation today patient presents for follow-up concerning her ongoing issues with her sacral ulcer and lower extremity ulcer. Fortunately she has been tolerating the dressing changes well complication the Wound VAC in general seems to have done very well up to this point. She does not have any evidence of infection which is good news. She does have some dark discoloration in central portion of the wound which is troublesome for the possibility of pressure injury to the site it sounds like her prior air mattress was not functioning properly her son has just bought a new one for her this is doing much better. Otherwise things seem to be progressing nicely. 09/10/17 on evaluation today patient presents for follow-up concerning her sacral wound and lower extremity ulcer. She has been tolerating the switch of her dressing changes to the Dakinos soaked gauze dressing very well in regard to the sacrum. The left lateral lower extremity ulcer appears to have a new area open just distal to the one that we have been treating with Santyl and this is new since her last visit. There does not appear to be any evidence of infection which is good news. With that being said there's a lot of maceration here at the site and I feel this is likely due to the switch and the dressings it appears that due to the sticking of the dressings it will switch to utilizing a telfa pad over the Santyl. I think this caused a lot of drainage to be collected and situated in the region just under the dressing which has led to this causing which duration of breakdown. Overall there does not appear to be any significant pain which is good  news. Of note I did actually have a conversation with the radiologist who is an interventional radiologist with [facility]. 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 With that being said there does not appear to be any rush to get this done according to the interventional radiologist whom I spoke with. Therefore after discussion it was determined that there gonna wait about six months prior to considering the procedure to get time for hopefully the sacral wound to heal in the interim to at least some degree. 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 According to nursing staff these are all improved, the negative pressure wound therapy system was put on hold last week. We will resume negative pressure wound therapy today, continue with Santyl to the lower extremity and she will follow-up next week. 09/24/17 on evaluation today unfortunately the patient's wound appears to be doing significantly worse compared to last week's evaluation and even my valuation the week before. She actually has bone noted on the left wound margin in the region of undermining unfortunately. This was not noted during the last evaluation. I am concerned about the fact that this seems to be worse not better since  our last visit with her. My biggest concern is that she's likely developing a worsening osteomyelitis. This was discussed with patient and her son today during the office visit. 05/03/17 on evaluation today patient actually appears to be doing rather well in regard to her sacral ulcer compared to last week's evaluation. She has been tolerating the dressing changes without complication. Fortunately even though we're not utilizing the Wound VAC she seems to be making some strides in seeing the wound area overall improved quite dramatically in my pinion in just one weeks time. She also seems to  be staying off of this to the point that I do not see any evidence of new injury which is excellent news. Whatever she's been doing in that regard over the past week I would want her to continue. I did also review the results of her bone culture which revealed group B strep as the responsible organism. Again this is what's causing her osteomyelitis she does have infectious disease appointment scheduled for 10/11/17 10/08/1898 valuation today patient appears to be doing better for the most part in regard to the sacral wound. Overall she is showing signs of having granulation of the bone which is good news. There is an area towards the left of the wound where she does seem to be showing some signs of opening this it would be due to additional pressure to the area unfortunately. Jessica [name], Jessica [name]. ([id number]) There does not appear to be any evidence of infection spreading which is good news that do not see any evidence of significant purulent discharge which is also good news. Electronic Signature(s) Signed: 10/09/2017 12:34:34 PM By: Lenda Kelp PA-C Entered By: Lenda Kelp on 10/09/2017 11:48:08 Jessica [name] ([id number]) -------------------------------------------------------------------------------- Physical Exam Details [name]. Date of Service: 10/08/2017 10:30 AM Medical Record Number: [id number] Patient Account Number: [id number] Date of Birth/Sex: [birth year] (72 y.o. F) Treating RN: Curtis Sites Primary Care Provider: Annita Brod Other Clinician: Referring Provider: Annita Brod Treating Provider/Extender: STONE III, Balian Schaller Weeks in Treatment: 19 Constitutional Well-nourished and well-hydrated in no acute distress. Respiratory normal breathing without difficulty. clear to auscultation bilaterally. Cardiovascular regular rate and rhythm with normal S1, S2. Psychiatric this patient is able to make decisions and demonstrates good insight into  disease process. Alert and Oriented x 3. pleasant and cooperative. Notes Patient's wound bed shows signs of doing very well in regard to the granulation which is good news. Nonetheless I do think that she still needs to go see infectious disease in the meantime I'm definitely keeping her on the current antibiotic regimen at this point. Electronic Signature(s) Signed: 10/09/2017 12:34:34 PM By: Lenda Kelp PA-C Entered By: Lenda Kelp on 10/09/2017 11:48:46 Jessica [name] ([id number]) -------------------------------------------------------------------------------- Physician Orders Details [name]. Date of Service: 10/08/2017 10:30 AM Medical Record Number: [id number] Patient Account Number: [id number] Date of Birth/Sex: 05-22-1945 (72 y.o. F) Treating RN: Curtis Sites Primary Care Provider: Annita Brod Other Clinician: Referring Provider: Annita Brod Treating Provider/Extender: Linwood Dibbles, Albin Duckett Weeks in Treatment: 34 Verbal / Phone Orders: No Diagnosis Coding ICD-10 Coding Code Description L89.154 Pressure ulcer of sacral region, stage 4 M48.00 Spinal stenosis, site unspecified R54 Age-related physical debility G89.4 Chronic pain syndrome E78.2 Mixed hyperlipidemia L89.93 Pressure ulcer of unspecified site, stage 3 Wound Cleansing Wound #1 Medial Sacrum o Other: - please cleanse sacral wound with dakins moistened gauze - do not spray dakins on wound Wound #2 Left Lower Leg o Clean wound  with Normal Saline. Anesthetic (add to Medication List) Wound #1 Medial Sacrum o Topical Lidocaine 4% cream applied to wound bed prior to debridement (In Clinic Only). Wound #2 Left Lower Leg o Topical Lidocaine 4% cream applied to wound bed prior to debridement (In Clinic Only). Skin Barriers/Peri-Wound Care Wound #2 Left Lower Leg o Antifungal powder-Nystatin Primary Wound Dressing Wound #1 Medial Sacrum o Alginate - on superficial area  to the right side of the wound o Pack wound with: - Dakin's soaked gauze Wound #2 Left Lower Leg o Silver Collagen Secondary Dressing Wound #1 Medial Sacrum o ABD pad - secure with tape Wound #2 Left Lower Leg o Boardered Foam Dressing - or ABD pad and tape Kandel, Brettany W. ([id number]) Dressing Change Frequency Wound #1 Medial Sacrum o Change dressing every day. Wound #2 Left Lower Leg o Change dressing every day. Follow-up Appointments Wound #1 Medial Sacrum o Return Appointment in 2 weeks. Wound #2 Left Lower Leg o Return Appointment in 2 weeks. Off-Loading Wound #1 Medial Sacrum o Turn and reposition every 2 hours Wound #2 Left Lower Leg o Turn and reposition every 2 hours Additional Orders / Instructions Wound #1 Medial Sacrum o Increase protein intake. Wound #2 Left Lower Leg o Increase protein intake. Home Health Wound #1 Medial Sacrum o Continue Home Health Visits - [facility] o Home Health Nurse may visit PRN to address patientos wound care needs. o FACE TO FACE ENCOUNTER: MEDICARE and MEDICAID PATIENTS: I certify that this patient is under my care and that I had a face-to-face encounter that meets the physician face-to-face encounter requirements with this patient on this date. The encounter with the patient was in whole or in part for the following MEDICAL CONDITION: (primary reason for Home Healthcare) MEDICAL NECESSITY: I certify, that based on my findings, NURSING services are a medically necessary home health service. HOME BOUND STATUS: I certify that my clinical findings support that this patient is homebound (i.e., Due to illness or injury, pt requires aid of supportive devices such as crutches, cane, wheelchairs, walkers, the use of special transportation or the assistance of another person to leave their place of residence. There is a normal inability to leave the home and doing so requires considerable and taxing effort.  Other absences are for medical reasons / religious services and are infrequent or of short duration when for other reasons). o If current dressing causes regression in wound condition, may D/C ordered dressing product/s and apply Normal Saline Moist Dressing daily until next Wound Healing Center / Other MD appointment. Notify Wound Healing Center of regression in wound condition at [phone]. o Please direct any NON-WOUND related issues/requests for orders to patient's Primary Care Physician Wound #2 Left Lower Leg o Continue Home Health Visits - [facility] o Home Health Nurse may visit PRN to address patientos wound care needs. o FACE TO FACE ENCOUNTER: MEDICARE and MEDICAID PATIENTS: I certify that this patient is under my care and that I had a face-to-face encounter that meets the physician face-to-face encounter requirements with this patient on this date. The encounter with the patient was in whole or in part for the following MEDICAL CONDITION: (primary reason for Home Healthcare) MEDICAL NECESSITY: I certify, that based on my findings, Jessica [name], Jessica [name] ([id number]) NURSING services are a medically necessary home health service. HOME BOUND STATUS: I certify that my clinical findings support that this patient is homebound (i.e., Due to illness or injury, pt requires aid of supportive devices such as crutches, cane,  wheelchairs, walkers, the use of special transportation or the assistance of another person to leave their place of residence. There is a normal inability to leave the home and doing so requires considerable and taxing effort. Other absences are for medical reasons / religious services and are infrequent or of short duration when for other reasons). o If current dressing causes regression in wound condition, may D/C ordered dressing product/s and apply Normal Saline Moist Dressing daily until next Wound Healing Center / Other MD appointment. Notify Wound Healing  Center of regression in wound condition at [phone]. o Please direct any NON-WOUND related issues/requests for orders to patient's Primary Care Physician Negative Pressure Wound Therapy Wound #1 Medial Sacrum o Place NPWT on HOLD. Patient Medications Allergies: zinc, Ambien Notifications Medication Indication Start End nystatin 10/08/2017 DOSE topical 100,000 unit/gram cream - cream topical applied in a thin film around the would of the lower leg until rash clears. Apply with each dressing change Electronic Signature(s) Signed: 10/08/2017 2:46:28 PM By: Lenda Kelp PA-C Entered By: Lenda Kelp on 10/08/2017 14:46:27 Jessica [name] ([id number]) -------------------------------------------------------------------------------- Problem List Details [name]. Date of Service: 10/08/2017 10:30 AM Medical Record Number: [id number] Patient Account Number: [id number] Date of Birth/Sex: 04-11-1946 (72 y.o. F) Treating RN: Curtis Sites Primary Care Provider: Annita Brod Other Clinician: Referring Provider: Annita Brod Treating Provider/Extender: Linwood Dibbles, Sharrod Achille Weeks in Treatment: 19 Active Problems ICD-10 Evaluated Encounter Code Description Active Date Today Diagnosis L89.154 Pressure ulcer of sacral region, stage 4 05/27/2017 No Yes M48.00 Spinal stenosis, site unspecified 05/27/2017 No Yes R54 Age-related physical debility 05/27/2017 No Yes G89.4 Chronic pain syndrome 05/27/2017 No Yes E78.2 Mixed hyperlipidemia 05/27/2017 No Yes L89.93 Pressure ulcer of unspecified site, stage 3 05/27/2017 No Yes Inactive Problems Resolved Problems Electronic Signature(s) Signed: 10/09/2017 12:34:34 PM By: Lenda Kelp PA-C Entered By: Lenda Kelp on 10/08/2017 10:52:03 Jessica [name] ([id number]) -------------------------------------------------------------------------------- Progress Note Details [name]. Date of  Service: 10/08/2017 10:30 AM Medical Record Number: [id number] Patient Account Number: [id number] Date of Birth/Sex: [birth year] (72 y.o. F) Treating RN: Curtis Sites Primary Care Provider: Annita Brod Other Clinician: Referring Provider: Annita Brod Treating Provider/Extender: Linwood Dibbles, Artelia Game Weeks in Treatment: 19 Subjective Chief Complaint Information obtained from Patient she is here for evaluation of a sacral ulcer and bilateral lower extremity ulcers History of Present Illness (HPI) 05/27/17-she is here in initial evaluation for a left-sided sacral stage IV pressure ulcer and bilateral lower extremity, lateral aspect, unstageable pressure ulcers. She is accompanied by her husband and her son, who are her primary caregivers. She is bedbound secondary to spinal stenosis. According to her son and husband she was hospitalized from 10/5-11/1 with healthcare associated pneumonia and altered mental status. During her hospitalization she was intubated, extubated on 10/27. She was discharged with Foley catheter and follow up with urology. According to her son and spouse she developed the sacral ulcer during hospitalization. Home health has been applying Santyl daily. She does have a low air loss mattress and is repositioned every 2-3 hours per her family's report. According to the son and spouse she had an appointment with urology on 12/18 and during that appointment developed discoloration to her bilateral lower extremities which ultimately developed into unstageable pressure ulcers to the lateral aspects of her bilateral lower extremities. There has been no topical treatment applied to these. She continues to have home health. There is no concerns expressed regarding dietary intake, stating she eats 3  meals a day, eating was provided; she is supplemented with boost with protein. 06/03/17-she is here in follow-up evaluation for sacral and bilateral lower extremity ulcers. Plain film x-ray  done today reveals no distraction to the sacrum or coccyx, no visible abnormalities. Home health has ordered the negative pressure wound system but it has not been initiated. We will continue with Hydrofera Blue until initiation of negative pressure wound system and continue with Santyl to bilateral lower extremity ulcers. Follow-up next week 06/10/17-she is here in follow-up evaluation for sacral and bilateral lower extremity ulcers. The wound VAC will be available tomorrow per home health. We will initiate wound VAC therapy to the sacral ulcer 3 times weekly (Thursday, Saturday/Sunday, Tuesday). We will continue with Santyl to the lower extremity ulcers. The patient's son is checking into home health therapy over the weekend for [facility] Hospital changes, with the understanding that if VAC changes cannot be performed over the weekend he will need to change his mother's appointments to Monday, Wednesday or Friday. The sacral ulcer clinically does not appear infected but there has been a change in the amount of drainage acutely, there is no significant amount of devitalized tissue, there is no malodor. Wound culture was obtained to evaluate for occult infection we will hold off on antibiotic therapy until sensitivities are resulted. 06/18/17 on evaluation today patient appears to be doing fairly well in my opinion although this is the first time I have seen this patient she has been previously evaluated by Tacey Ruiz here in our office. She is going to be switching to Fridays to see me due to the Wound VAC schedule being changed on Monday, Wednesday, and Friday. Subsequently she seems to be doing fairly well with the Wound VAC. Her son who was present during evaluation today states that he somewhat stresses of the Wound VAC in making sure that it was functioning appropriately. With that being said everything seems to be working well he knows what settings on the [facility] itself to look at and ensure that it is functioning  properly. Overall the wound appears to be nice and clean there is no need for debridement today. She has no discomfort in her bilateral lower extremity ulcers also appear to be improving based on measurements and what this honest tell me about the overall appearance. 07/02/17 on evaluation today I noted in patients wound bed that was actually an odor that had not previously been noted. Subsequently there was also a small area of bone that was not previously noted during my last evaluation. This did appear to be necrotic and was being somewhat forced out by the body around the region of granulation. She does not have any pain which is good news. Nonetheless the overall appearance of the ulcer is making me concerned for the patient having developed osteomyelitis. Currently she is not been on any antibiotics and the Wound VAC has been doing fairly well in general. With that being said I do not think we need to continue the Wound VAC if she potentially has a bone infection. At least not until it is properly addressed with antibiotics. TAMEKO, HALDER ([id number]) 07/09/17 on evaluation today patient appears to actually be doing very well in regard to her sacral ulcer compared to last week. There is actually no exposed bone at this point. 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  With that being said still I do believe she needs to have a referral to infectious disease due to the osteomyelitis I am gonna recommend that she continue with the doxycycline based on the culture results which showed Eikenella Corrodens as the organism identified that tetracycline should work for. She does not seem to be having any discomfort whatsoever at this point. 07/16/17 on evaluation today patient actually appears to be doing rather well in regard to her sacral  wound. I think that the original wound site actually appears much better than previously noted. With that being said she does have a new superficial injury on the right sacral region which appears to be due to according to the sign transport that occurred for her MRI unfortunately. Fortunately this is not too deep and I do think it can be managed but it was a new area that was not previously noted. Otherwise she has been tolerating the Dakin s soaked gauze packing without complication. 07/30/17 on evaluation today patient presents for reevaluation concerning her sacral wound. She has been tolerating the dressing changes without complication. With that being said her wound is doing so well at this point that I think she may be at the point where we could reinitiate the Wound VAC currently and hopefully see good results from this. I'm very pleased with how she has responded to the [facility] s soaked gauze packing. 08/13/17 evaluation today patient appears to be doing excellent in regard to her lower extremity ulcer this seems to be cleaning up very nicely. In regard to the sacral ulcer the area of trauma on the right lateral portion of the wound actually appears to be much better than previously noted during the last evaluation. The word has definitely filled in and the Wound VAC appears to be helpful I do believe. Overall I'm very pleased with how things seem to be progressing at this time. Patient likewise is also happy that things are doing well. 08/27/17 on evaluation today patient presents for follow-up concerning her ongoing issues with her sacral ulcer and lower extremity ulcer. Fortunately she has been tolerating the dressing changes well complication the Wound VAC in general seems to have done very well up to this point. She does not have any evidence of infection which is good news. She does have some dark discoloration in central portion of the wound which is troublesome for the possibility of  pressure injury to the site it sounds like her prior air mattress was not functioning properly her son has just bought a new one for her this is doing much better. Otherwise things seem to be progressing nicely. 09/10/17 on evaluation today patient presents for follow-up concerning her sacral wound and lower extremity ulcer. She has been tolerating the switch of her dressing changes to the Dakin s soaked gauze dressing very well in regard to the sacrum. The left lateral lower extremity ulcer appears to have a new area open just distal to the one that we have been treating with Santyl and this is new since her last visit. There does not appear to be any evidence of infection which is good news. With that being said there's a lot of maceration here at the site and I feel this is likely due to the switch and the dressings it appears that due to the sticking of the dressings it will switch to utilizing a telfa pad over the Santyl. I think this caused a lot of drainage to be collected and situated in the region  just under the dressing which has led to this causing which duration of breakdown. Overall there does not appear to be any significant pain which is good news. Of note I did actually have a conversation with the radiologist who is an interventional radiologist with [facility]. 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 With that being said there does not appear to be any rush to get this done according to the interventional radiologist whom I spoke with. Therefore after discussion it was determined that there gonna wait about six months prior to considering the procedure to get time for hopefully the sacral wound to heal in the interim to at least some degree. 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 According to nursing staff these are all improved, the negative  pressure wound therapy system was put on hold last week. We will resume negative pressure wound therapy today, continue with Santyl to the lower extremity and she will follow-up next week. 09/24/17 on evaluation today unfortunately the patient's wound appears to be doing significantly worse compared to last week's evaluation and even my valuation the week before. She actually has bone noted on the left wound margin in the region of undermining unfortunately. This was not noted during the last evaluation. I am concerned about the fact that this seems to be worse not better since our last visit with her. My biggest concern is that she's likely developing a worsening osteomyelitis. This was discussed with patient and her son today during the office visit. 05/03/17 on evaluation today patient actually appears to be doing rather well in regard to her sacral ulcer compared to last week's evaluation. She has been tolerating the dressing changes without complication. Fortunately even though we're not utilizing the Wound VAC she seems to be making some strides in seeing the wound area overall improved quite dramatically in my pinion in just one weeks time. She also seems to be staying off of this to the point that I do not see any evidence of new injury which is excellent news. Whatever she's been doing in that regard over the past week I would want her to continue. Jessica [name], Jessica [name] ([id number]) did also review the results of her bone culture which revealed group B strep as the responsible organism. Again this is what's causing her osteomyelitis she does have infectious disease appointment scheduled for 10/11/17 10/08/1898 valuation today patient appears to be doing better for the most part in regard to the sacral wound. Overall she is showing signs of having granulation of the bone which is good news. There is an area towards the left of the wound where she does seem to be showing some signs of opening this  it would be due to additional pressure to the area unfortunately. There does not appear to be any evidence of infection spreading which is good news that do not see any evidence of significant purulent discharge which is also good news. Patient History Information obtained from Patient. Family History No family history of Cancer, Diabetes, Heart Disease. Social History Never smoker, Marital Status - Married. Review of Systems (ROS) Constitutional Symptoms (General Health) Denies complaints or symptoms of Fever, Chills. Respiratory The patient has no complaints or symptoms. Cardiovascular The patient has no complaints or symptoms. Psychiatric The patient has no complaints or symptoms. Objective Constitutional Well-nourished and well-hydrated in no acute distress. Vitals Time Taken: 10:50  AM, Height: 70 in, Temperature: 99.1 F, Pulse: 77 bpm, Respiratory Rate: 18 breaths/min, Blood Pressure: 132/64 mmHg. Respiratory normal breathing without difficulty. clear to auscultation bilaterally. Cardiovascular regular rate and rhythm with normal S1, S2. Psychiatric this patient is able to make decisions and demonstrates good insight into disease process. Alert and Oriented x 3. pleasant and cooperative. General Notes: Patient's wound bed shows signs of doing very well in regard to the granulation which is good news. Nonetheless I do think that she still needs to go see infectious disease in the meantime I'm definitely keeping her on the [place], Jessica [name]. ([id number]) current antibiotic regimen at this point. Integumentary (Hair, Skin) Wound #1 status is Open. Original cause of wound was Pressure Injury. The wound is located on the Medial Sacrum. The wound measures 7.5cm length x 7.5cm width x 2.5cm depth; 44.179cm^2 area and 110.447cm^3 volume. There is bone, muscle, and Fat Layer (Subcutaneous Tissue) Exposed exposed. There is no tunneling noted, however, there is undermining starting  at 8:00 and ending at 1:00 with a maximum distance of 4cm. There is a large amount of purulent drainage noted. Foul odor after cleansing was noted. The wound margin is distinct with the outline attached to the wound base. There is medium (34-66%) red, pink, hyper - granulation within the wound bed. There is a medium (34-66%) amount of necrotic tissue within the wound bed including Eschar and Adherent Slough. The periwound skin appearance exhibited: Induration, Scarring, Rubor, Erythema. The periwound skin appearance did not exhibit: Callus, Crepitus, Excoriation, Rash, Dry/Scaly, Maceration, Atrophie Blanche, Cyanosis, Ecchymosis, Hemosiderin Staining, Mottled, Pallor. The surrounding wound skin color is noted with erythema which is circumferential. Periwound temperature was noted as No Abnormality. The periwound has tenderness on palpation. Wound #2 status is Open. Original cause of wound was Gradually Appeared. The wound is located on the Left Lower Leg. The wound measures 1.2cm length x 0.6cm width x 0.2cm depth; 0.565cm^2 area and 0.113cm^3 volume. There is Fat Layer (Subcutaneous Tissue) Exposed exposed. There is no tunneling or undermining noted. There is a large amount of serosanguineous drainage noted. The wound margin is distinct with the outline attached to the wound base. There is medium (34-66%) red, pink granulation within the wound bed. There is a medium (34-66%) amount of necrotic tissue within the wound bed including Eschar and Adherent Slough. The periwound skin appearance did not exhibit: Callus, Crepitus, Excoriation, Induration, Rash, Scarring, Dry/Scaly, Maceration, Atrophie Blanche, Cyanosis, Ecchymosis, Hemosiderin Staining, Mottled, Pallor, Rubor, Erythema. Periwound temperature was noted as No Abnormality. Assessment Active Problems ICD-10 Pressure ulcer of sacral region, stage 4 Spinal stenosis, site unspecified Age-related physical debility Chronic pain  syndrome Mixed hyperlipidemia Pressure ulcer of unspecified site, stage 3 Plan Wound Cleansing: Wound #1 Medial Sacrum: Other: - please cleanse sacral wound with dakins moistened gauze - do not spray dakins on wound Wound #2 Left Lower Leg: Clean wound with Normal Saline. Anesthetic (add to Medication List): Wound #1 Medial Sacrum: Topical Lidocaine 4% cream applied to wound bed prior to debridement (In Clinic Only). Wound #2 Left Lower Leg: Topical Lidocaine 4% cream applied to wound bed prior to debridement (In Clinic Only). Skin Barriers/Peri-Wound Care: Wound #2 Left Lower Leg: Jessica [name], Jessica [name] ([id number]) Antifungal powder-Nystatin Primary Wound Dressing: Wound #1 Medial Sacrum: Alginate - on superficial area to the right side of the wound Pack wound with: - Dakin's soaked gauze Wound #2 Left Lower Leg: Silver Collagen Secondary Dressing: Wound #1 Medial Sacrum: ABD pad - secure with tape Wound #  2 Left Lower Leg: Boardered Foam Dressing - or ABD pad and tape Dressing Change Frequency: Wound #1 Medial Sacrum: Change dressing every day. Wound #2 Left Lower Leg: Change dressing every day. Follow-up Appointments: Wound #1 Medial Sacrum: Return Appointment in 2 weeks. Wound #2 Left Lower Leg: Return Appointment in 2 weeks. Off-Loading: Wound #1 Medial Sacrum: Turn and reposition every 2 hours Wound #2 Left Lower Leg: Turn and reposition every 2 hours Additional Orders / Instructions: Wound #1 Medial Sacrum: Increase protein intake. Wound #2 Left Lower Leg: Increase protein intake. Home Health: Wound #1 Medial Sacrum: Continue Home Health Visits - [facility] Nurse may visit PR[facility] to address patient s wound care needs. FACE TO FACE ENCOUNTER: MEDICARE and MEDICAID PATIENTS: I certify that this patient is under my care and that I had a face-to-face encounter that meets the physician face-to-face encounter requirements with this patient on this date.  The encounter with the patient was in whole or in part for the following MEDICAL CONDITION: (primary reason for Home Healthcare) MEDICAL NECESSITY: I certify, that based on my findings, NURSING services are a medically necessary home health service. HOME BOUND STATUS: I certify that my clinical findings support that this patient is homebound (i.e., Due to illness or injury, pt requires aid of supportive devices such as crutches, cane, wheelchairs, walkers, the use of special transportation or the assistance of another person to leave their place of residence. There is a normal inability to leave the home and doing so requires considerable and taxing effort. Other absences are for medical reasons / religious services and are infrequent or of short duration when for other reasons). If current dressing causes regression in wound condition, may D/C ordered dressing product/s and apply Normal Saline Moist Dressing daily until next Wound Healing Center / Other MD appointment. Notify Wound Healing Center of regression in wound condition at [phone]. Please direct any NON-WOUND related issues/requests for orders to patient's Primary Care Physician Wound #2 Left Lower Leg: Continue Home Health Visits - [facility] Nurse may visit PRN to address patient s wound care needs. FACE TO FACE ENCOUNTER: MEDICARE and MEDICAID PATIENTS: I certify that this patient is under my care and that I had a face-to-face encounter that meets the physician face-to-face encounter requirements with this patient on this date. The encounter with the patient was in whole or in part for the following MEDICAL CONDITION: (primary reason for Home Healthcare) MEDICAL NECESSITY: I certify, that based on my findings, NURSING services are a medically necessary home health service. HOME BOUND STATUS: I certify that my clinical findings support that this patient is homebound (i.e., Due to illness or injury, pt requires aid of  supportive devices such as crutches, cane, wheelchairs, walkers, the use of special transportation or the assistance of another person to leave their place of residence. There is a normal inability to leave the home and doing so requires considerable and taxing effort. Other absences are for medical reasons / religious services and Jessica [name], Jessica [name]. ([id number]) are infrequent or of short duration when for other reasons). If current dressing causes regression in wound condition, may D/C ordered dressing product/s and apply Normal Saline Moist Dressing daily until next Wound Healing Center / Other MD appointment. Notify Wound Healing Center of regression in wound condition at [phone]. Please direct any NON-WOUND related issues/requests for orders to patient's Primary Care Physician Negative Pressure Wound Therapy: Wound #1 Medial Sacrum: Place NPWT on HOLD. The following medication(s) was prescribed: nystatin  topical 100,000 unit/gram cream cream topical applied in a thin film around the would of the lower leg until rash clears. Apply with each dressing change starting 10/08/2017 We will go ahead and continue with the above wound care orders for the next week. The patient is in agreement with plan as is her son. Hopefully the new air fluidized bed that she is going to be receiving will be helpful in preventing additional pressure to the sacrum as well. Please see above for specific wound care orders. We will see patient for re-evaluation in 1 week(s) here in the clinic. If anything worsens or changes patient will contact our office for additional recommendations. Electronic Signature(s) Signed: 10/09/2017 12:34:34 PM By: Lenda Kelp PA-C Entered By: Lenda Kelp on 10/09/2017 11:49:45 Jessica [name] ([id number]) -------------------------------------------------------------------------------- ROS/PFSH Details [name]. Date of Service: 10/08/2017 10:30  AM Medical Record Number: [id number] Patient Account Number: [id number] Date of Birth/Sex: 1945-12-17 (72 y.o. F) Treating RN: Curtis Sites Primary Care Provider: Annita Brod Other Clinician: Referring Provider: Annita Brod Treating Provider/Extender: STONE III, Hyun Marsalis Weeks in Treatment: 19 Information Obtained From Patient Wound History Constitutional Symptoms (General Health) Complaints and Symptoms: Negative for: Fever; Chills Respiratory Complaints and Symptoms: No Complaints or Symptoms Cardiovascular Complaints and Symptoms: No Complaints or Symptoms Psychiatric Complaints and Symptoms: No Complaints or Symptoms Immunizations Pneumococcal Vaccine: Received Pneumococcal Vaccination: No Implantable Devices Family and Social History Cancer: No; Diabetes: No; Heart Disease: No; Never smoker; Marital Status - Married Physician Affirmation I have reviewed and agree with the above information. Electronic Signature(s) Signed: 10/09/2017 12:34:34 PM By: Lenda Kelp PA-C Signed: 10/11/2017 5:27:38 PM By: Curtis Sites Entered By: Lenda Kelp on 10/09/2017 11:48:31 Jessica [name] ([id number]) -------------------------------------------------------------------------------- SuperBill Details [name]. Date of Service: 10/08/2017 Medical Record Number: [id number] Patient Account Number: [id number] Date of Birth/Sex: 1946/01/13 (72 y.o. F) Treating RN: Curtis Sites Primary Care Provider: Annita Brod Other Clinician: Referring Provider: Annita Brod Treating Provider/Extender: STONE III, Mahima Hottle Weeks in Treatment: 19 Diagnosis Coding ICD-10 Codes Code Description L89.154 Pressure ulcer of sacral region, stage 4 M48.00 Spinal stenosis, site unspecified R54 Age-related physical debility G89.4 Chronic pain syndrome E78.2 Mixed hyperlipidemia L89.93 Pressure ulcer of unspecified site, stage 3 Facility Procedures CPT4 Code:  [id number] Description: 99214 - WOUND CARE VISIT-LEV 4 EST PT Modifier: Quantity: 1 Physician Procedures CPT4 Code: 3244010 Description: 99213 - WC PHYS LEVEL 3 - EST PT ICD-10 Diagnosis Description L89.154 Pressure ulcer of sacral region, stage 4 M48.00 Spinal stenosis, site unspecified R54 Age-related physical debility G89.4 Chronic pain syndrome Modifier: Quantity: 1 Electronic Signature(s) Signed: 10/09/2017 12:34:34 PM By: Lenda Kelp PA-C Previous Signature: 10/08/2017 12:34:30 PM Version By: Curtis Sites Entered By: Lenda Kelp on 10/09/2017 11:50:08

## 2017-10-12 NOTE — Telephone Encounter (Signed)
Information sent to [facility] to have the patient PICC care after placement. No date for placement yet but IR called and message left. The patient has difficulty with transportation as she is bed bound so the family requested to have PICC placed next week 10-18-17 thru 10-22-17 [facility]. Advised will call once the appt is made.

## 2017-10-12 NOTE — Assessment & Plan Note (Signed)
Insert PICC next week.

## 2017-10-12 NOTE — Telephone Encounter (Signed)
Patient's son Jessica [name] called. He noticed that the medication his mother had been taking was augmentin, not amoxicillin.  RN notified Jessica [name] who sent in a continuation of augmentin. RN cancelled the amoxicillin rx already sent to the pharmacy. Jessica [name], Jessica Dilday M, RN

## 2017-10-13 ENCOUNTER — Telehealth: Payer: Self-pay | Admitting: *Deleted

## 2017-10-13 NOTE — Telephone Encounter (Signed)
Error see note from 10/12/17

## 2017-10-13 NOTE — Telephone Encounter (Signed)
Called the patient to advise of the appointment for the PICC placement 10/21/17 at 2 pm arrive at [facility] to [facility]. Advised will be receiving a call from [facility] to set up training and delivery. Advised to call the office if any problems. 

## 2017-10-13 NOTE — Telephone Encounter (Signed)
Called the patient to advise of the appointment for the PICC placement 10/21/17 at 2 pm [facility]. Advised will be receiving a call from [facility] to set up training and delivery. Advised to call the office if any problems.

## 2017-10-21 ENCOUNTER — Ambulatory Visit (HOSPITAL_COMMUNITY)
Admission: RE | Admit: 2017-10-21 | Discharge: 2017-10-21 | Disposition: A | Discharge status: Home / Self Care | Payer: Medicare Other | Source: Ambulatory Visit | Attending: Infectious Diseases | Admitting: Infectious Diseases

## 2017-10-21 DIAGNOSIS — Z832 Family history of diseases of the blood and blood-forming organs and certain disorders involving the immune mechanism: Secondary | ICD-10-CM | POA: Diagnosis not present

## 2017-10-21 DIAGNOSIS — Z86718 Personal history of other venous thrombosis and embolism: Secondary | ICD-10-CM | POA: Insufficient documentation

## 2017-10-21 DIAGNOSIS — R634 Abnormal weight loss: Secondary | ICD-10-CM | POA: Diagnosis not present

## 2017-10-21 DIAGNOSIS — Z888 Allergy status to other drugs, medicaments and biological substances status: Secondary | ICD-10-CM | POA: Insufficient documentation

## 2017-10-21 DIAGNOSIS — Z7982 Long term (current) use of aspirin: Secondary | ICD-10-CM | POA: Insufficient documentation

## 2017-10-21 DIAGNOSIS — L89154 Pressure ulcer of sacral region, stage 4: Secondary | ICD-10-CM | POA: Diagnosis not present

## 2017-10-21 DIAGNOSIS — E785 Hyperlipidemia, unspecified: Secondary | ICD-10-CM | POA: Diagnosis not present

## 2017-10-21 DIAGNOSIS — I878 Other specified disorders of veins: Secondary | ICD-10-CM | POA: Insufficient documentation

## 2017-10-21 DIAGNOSIS — E039 Hypothyroidism, unspecified: Secondary | ICD-10-CM | POA: Diagnosis not present

## 2017-10-21 DIAGNOSIS — M1990 Unspecified osteoarthritis, unspecified site: Secondary | ICD-10-CM | POA: Diagnosis not present

## 2017-10-21 DIAGNOSIS — B3749 Other urogenital candidiasis: Secondary | ICD-10-CM | POA: Diagnosis not present

## 2017-10-21 DIAGNOSIS — M4628 Osteomyelitis of vertebra, sacral and sacrococcygeal region: Secondary | ICD-10-CM | POA: Diagnosis not present

## 2017-10-21 DIAGNOSIS — Z9889 Other specified postprocedural states: Secondary | ICD-10-CM | POA: Diagnosis not present

## 2017-10-21 DIAGNOSIS — I5032 Chronic diastolic (congestive) heart failure: Secondary | ICD-10-CM | POA: Insufficient documentation

## 2017-10-21 DIAGNOSIS — Z8249 Family history of ischemic heart disease and other diseases of the circulatory system: Secondary | ICD-10-CM | POA: Diagnosis not present

## 2017-10-21 DIAGNOSIS — F329 Major depressive disorder, single episode, unspecified: Secondary | ICD-10-CM | POA: Diagnosis not present

## 2017-10-21 DIAGNOSIS — I110 Hypertensive heart disease with heart failure: Secondary | ICD-10-CM | POA: Diagnosis not present

## 2017-10-21 DIAGNOSIS — Z7401 Bed confinement status: Secondary | ICD-10-CM | POA: Insufficient documentation

## 2017-10-21 DIAGNOSIS — Z79890 Hormone replacement therapy (postmenopausal): Secondary | ICD-10-CM | POA: Diagnosis not present

## 2017-10-21 DIAGNOSIS — Z79899 Other long term (current) drug therapy: Secondary | ICD-10-CM | POA: Diagnosis not present

## 2017-10-21 DIAGNOSIS — Z87440 Personal history of urinary (tract) infections: Secondary | ICD-10-CM | POA: Insufficient documentation

## 2017-10-21 DIAGNOSIS — Z452 Encounter for adjustment and management of vascular access device: Secondary | ICD-10-CM

## 2017-10-21 MED ORDER — SODIUM CHLORIDE 0.9 % IV SOLN
3.0000 g | INTRAVENOUS | Status: AC
  Administered 2017-10-21: 3 g via INTRAVENOUS
  Filled 2017-10-21 (×2): qty 3

## 2017-10-21 MED ORDER — VANCOMYCIN HCL 10 G IV SOLR
2000.0000 mg | INTRAVENOUS | Status: AC
  Administered 2017-10-21: 2000 mg via INTRAVENOUS
  Filled 2017-10-21: qty 2000

## 2017-10-21 MED ORDER — HEPARIN SOD (PORK) LOCK FLUSH 100 UNIT/ML IV SOLN
INTRAVENOUS | Status: AC
  Administered 2017-10-21: 250 [IU]
  Filled 2017-10-21: qty 5

## 2017-10-21 MED ORDER — LIDOCAINE HCL 1 % IJ SOLN
INTRAMUSCULAR | Status: AC
  Filled 2017-10-21: qty 20

## 2017-10-21 MED ORDER — LIDOCAINE HCL (PF) 1 % IJ SOLN
INTRAMUSCULAR | Status: DC | PRN
  Administered 2017-10-21: 2 mL

## 2017-10-21 NOTE — Procedures (Signed)
Pre procedural Diagnosis: Poor venous access Post Procedural Diagnosis: Same  Successful placement of left basilic vein approach 43 cm dual lumen PICC line with tip at the superior caval-atrial junction.    EBL: None  No immediate post procedural complication.  The PICC line is ready for immediate use.  Katherina RightJay Chicquita Mendel, MD Pager #: [phone]

## 2017-10-22 ENCOUNTER — Ambulatory Visit: Payer: Self-pay | Admitting: Physician Assistant

## 2017-10-25 ENCOUNTER — Telehealth: Payer: Self-pay | Admitting: *Deleted

## 2017-10-25 ENCOUNTER — Ambulatory Visit: Payer: Self-pay | Admitting: Physician Assistant

## 2017-10-25 NOTE — Telephone Encounter (Signed)
Home health company taking care of patient requires a physician sig and can not take a NP for the orders. Advised Dr Ninetta LightsHatcher is the supervising physician for Rexene AlbertsStephanie Dixon NP and to fax anything they need signed to office.

## 2017-10-26 ENCOUNTER — Encounter: Payer: Medicare Other | Attending: Physician Assistant | Admitting: Physician Assistant

## 2017-10-26 DIAGNOSIS — Z7401 Bed confinement status: Secondary | ICD-10-CM | POA: Diagnosis not present

## 2017-10-26 DIAGNOSIS — L89154 Pressure ulcer of sacral region, stage 4: Secondary | ICD-10-CM | POA: Diagnosis not present

## 2017-10-26 DIAGNOSIS — G894 Chronic pain syndrome: Secondary | ICD-10-CM | POA: Diagnosis not present

## 2017-10-26 DIAGNOSIS — M4800 Spinal stenosis, site unspecified: Secondary | ICD-10-CM | POA: Diagnosis not present

## 2017-10-26 DIAGNOSIS — E782 Mixed hyperlipidemia: Secondary | ICD-10-CM | POA: Diagnosis not present

## 2017-10-26 DIAGNOSIS — L89893 Pressure ulcer of other site, stage 3: Secondary | ICD-10-CM | POA: Insufficient documentation

## 2017-10-27 ENCOUNTER — Encounter: Payer: Self-pay | Admitting: Infectious Diseases

## 2017-10-28 NOTE — Progress Notes (Signed)
SHAKIYA, MCNEARY ([id number]) Visit Report for 10/26/2017 Chief Complaint Document Details [name], Jessica [name]. Date of Service: 10/26/2017 11:00 AM Medical Record Number: [id number] Patient Account Number: [id number] Date of Birth/Sex: Jun 04, 1945 (72 y.o. F) Treating RN: Phillis Haggis Primary Care Provider: Annita Brod Other Clinician: Referring Provider: Annita Brod Treating Provider/Extender: Linwood Dibbles, HOYT Weeks in Treatment: 21 Information Obtained from: Patient Chief Complaint she is here for evaluation of a sacral ulcer and bilateral lower extremity ulcers Electronic Signature(s) Signed: 10/27/2017 1:39:21 PM By: Lenda Kelp PA-C Entered By: Lenda Kelp on 10/26/2017 11:50:17 Jessica [name] ([id number]) -------------------------------------------------------------------------------- HPI Details [name]. Date of Service: 10/26/2017 11:00 AM Medical Record Number: [id number] Patient Account Number: [id number] Date of Birth/Sex: [birth year] (72 y.o. F) Treating RN: Phillis Haggis Primary Care Provider: Annita Brod Other Clinician: Referring Provider: Annita Brod Treating Provider/Extender: Linwood Dibbles, HOYT Weeks in Treatment: 21 History of Present Illness HPI Description: 05/27/17-she is here in initial evaluation for a left-sided sacral stage IV pressure ulcer and bilateral lower extremity, lateral aspect, unstageable pressure ulcers. She is accompanied by her husband and her son, who are her primary caregivers. She is bedbound secondary to spinal stenosis. According to her son and husband she was hospitalized from 10/5-11/1 with healthcare associated pneumonia and altered mental status. During her hospitalization she was intubated, extubated on 10/27. She was discharged with Foley catheter and follow up with urology. According to her son and spouse she developed the sacral ulcer during hospitalization. Home health has  been applying Santyl daily. She does have a low air loss mattress and is repositioned every 2-3 hours per her family's report. According to the son and spouse she had an appointment with urology on 12/18 and during that appointment developed discoloration to her bilateral lower extremities which ultimately developed into unstageable pressure ulcers to the lateral aspects of her bilateral lower extremities. There has been no topical treatment applied to these. She continues to have home health. There is no concerns expressed regarding dietary intake, stating she eats 3 meals a day, eating was provided; she is supplemented with boost with protein. 06/03/17-she is here in follow-up evaluation for sacral and bilateral lower extremity ulcers. Plain film x-ray done today reveals no distraction to the sacrum or coccyx, no visible abnormalities. Home health has ordered the negative pressure wound system but it has not been initiated. We will continue with Hydrofera Blue until initiation of negative pressure wound system and continue with Santyl to bilateral lower extremity ulcers. Follow-up next week 06/10/17-she is here in follow-up evaluation for sacral and bilateral lower extremity ulcers. The wound VAC will be available tomorrow per home health. We will initiate wound VAC therapy to the sacral ulcer 3 times weekly (Thursday, Saturday/Sunday, Tuesday). We will continue with Santyl to the lower extremity ulcers. The patient's son is checking into home health therapy over the weekend for [facility] changes, with the understanding that if VAC changes cannot be performed over the weekend he will need to change his mother's appointments to Monday, Wednesday or Friday. The sacral ulcer clinically does not appear infected but there has been a change in the amount of drainage acutely, there is no significant amount of devitalized tissue, there is no malodor. Wound culture was obtained to evaluate for occult infection we  will hold off on antibiotic therapy until sensitivities are resulted. 06/18/17 on evaluation today patient appears to be doing fairly well in my opinion although this is the first time I  have seen this patient she has been previously evaluated by Tacey RuizLeah here in our office. She is going to be switching to Fridays to see me due to the Wound VAC schedule being changed on Monday, Wednesday, and Friday. Subsequently she seems to be doing fairly well with the Wound VAC. Her son who was present during evaluation today states that he somewhat stresses of the Wound VAC in making sure that it was functioning appropriately. With that being said everything seems to be working well he knows what settings on the [facility] HVAC itself to look at and ensure that it is functioning properly. Overall the wound appears to be nice and clean there is no need for debridement today. She has no discomfort in her bilateral lower extremity ulcers also appear to be improving based on measurements and what this honest tell me about the overall appearance. 07/02/17 on evaluation today I noted in patients wound bed that was actually an odor that had not previously been noted. Subsequently there was also a small area of bone that was not previously noted during my last evaluation. This did appear to be necrotic and was being somewhat forced out by the body around the region of granulation. She does not have any pain which is good news. Nonetheless the overall appearance of the ulcer is making me concerned for the patient having developed osteomyelitis. Currently she is not been on any antibiotics and the Wound VAC has been doing fairly well in general. With that being said I do not think we need to continue the Wound VAC if she potentially has a bone infection. At least not until it is properly addressed with antibiotics. 07/09/17 on evaluation today patient appears to actually be doing very well in regard to her sacral ulcer compared to last  week. There is actually no exposed bone at this point. 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 With that being said still I do believe she needs to have a referral to infectious disease due to the osteomyelitis I am gonna recommend that she continue with the doxycycline based on the culture results which showed Eikenella Corrodens as the Jessica BradyOVERMAN, Rayvin W. ([id number]) organism identified that tetracycline should work for. She does not seem to be having any discomfort whatsoever at this point. 07/16/17 on evaluation today patient actually appears to be doing rather well in regard to her sacral wound. I think that the original wound site actually appears much better than previously noted. With that being said she does have a new superficial injury on the right sacral region which appears to be due to according to the sign transport that occurred for her MRI unfortunately. Fortunately this is not too deep and I do think it can be managed but it was a new area that was not previously noted. Otherwise she has been tolerating the Dakinos soaked gauze packing without complication. 07/30/17 on evaluation today patient presents for reevaluation concerning her sacral wound. She has been tolerating the dressing changes without complication. With that being said her wound is doing so well at this point that I think she may be at the point where we could reinitiate the Wound VAC currently and hopefully see good results from this. I'm very pleased with how she has responded to the Dakinos soaked gauze packing. 08/13/17 evaluation today patient appears to be doing excellent in  regard to her lower extremity ulcer this seems to be cleaning up very nicely. In regard to the sacral ulcer the area of trauma on the right lateral portion of the wound actually appears  to be much better than previously noted during the last evaluation. The word has definitely filled in and the Wound VAC appears to be helpful I do believe. Overall I'm very pleased with how things seem to be progressing at this time. Patient likewise is also happy that things are doing well. 08/27/17 on evaluation today patient presents for follow-up concerning her ongoing issues with her sacral ulcer and lower extremity ulcer. Fortunately she has been tolerating the dressing changes well complication the Wound VAC in general seems to have done very well up to this point. She does not have any evidence of infection which is good news. She does have some dark discoloration in central portion of the wound which is troublesome for the possibility of pressure injury to the site it sounds like her prior air mattress was not functioning properly her son has just bought a new one for her this is doing much better. Otherwise things seem to be progressing nicely. 09/10/17 on evaluation today patient presents for follow-up concerning her sacral wound and lower extremity ulcer. She has been tolerating the switch of her dressing changes to the Dakinos soaked gauze dressing very well in regard to the sacrum. The left lateral lower extremity ulcer appears to have a new area open just distal to the one that we have been treating with Santyl and this is new since her last visit. There does not appear to be any evidence of infection which is good news. With that being said there's a lot of maceration here at the site and I feel this is likely due to the switch and the dressings it appears that due to the sticking of the dressings it will switch to utilizing a telfa pad over the Santyl. I think this caused a lot of drainage to be collected and situated in the region just under the dressing which has led to this causing which duration of breakdown. Overall there does not appear to be any significant pain which is good  news. Of note I did actually have a conversation with the radiologist who is an interventional radiologist with [facility]. 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 With that being said there does not appear to be any rush to get this done according to the interventional radiologist whom I spoke with. Therefore after discussion it was determined that there gonna wait about six months prior to considering the procedure to get time for hopefully the sacral wound to heal in the interim to at least some degree. 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 According to nursing staff these are all improved, the negative pressure wound therapy system was put on hold last week. We will resume negative pressure wound therapy today, continue with Santyl to the lower extremity and she will follow-up next week. 09/24/17 on evaluation today unfortunately the patient's wound appears to be doing significantly worse compared to last week's evaluation and even my valuation the week before. She actually has bone noted on the left wound margin in the region of undermining unfortunately. This was not noted during the last evaluation. I am concerned about the fact that this seems to be worse not better since  our last visit with her. My biggest concern is that she's likely developing a worsening osteomyelitis. This was discussed with patient and her son today during the office visit. 05/03/17 on evaluation today patient actually appears to be doing rather well in regard to her sacral ulcer compared to last week's evaluation. She has been tolerating the dressing changes without complication. Fortunately even though we're not utilizing the Wound VAC she seems to be making some strides in seeing the wound area overall improved quite dramatically in my pinion in just one weeks time. She also seems to  be staying off of this to the point that I do not see any evidence of new injury which is excellent news. Whatever she's been doing in that regard over the past week I would want her to continue. I did also review the results of her bone culture which revealed group B strep as the responsible organism. Again this is what's causing her osteomyelitis she does have infectious disease appointment scheduled for 10/11/17 10/08/1898 valuation today patient appears to be doing better for the most part in regard to the sacral wound. Overall she is showing signs of having granulation of the bone which is good news. There is an area towards the left of the wound where she does seem to be showing some signs of opening this it would be due to additional pressure to the area unfortunately. KIRSTI, MCALPINE. ([id number]) There does not appear to be any evidence of infection spreading which is good news that do not see any evidence of significant purulent discharge which is also good news. 10/26/17 on evaluation today patient sacral ulcer actually appears to be very healthy and doing well in regard to the overall appearance of the wound. With that being said she does seem to be having some issues at this point in time with some shear/friction injury of the sacrum from where she was transported to [facility] for her infectious disease appointment. She has been placed on IV antibiotic therapy I will have to go back and find that note for review as I did not have access to that today. Nonetheless I will add the next visit be sure to document the exact antibiotics that she is taking at this point. Nonetheless I do believe the wound is doing better there's no bone exposure nothing that requires debridement in regard to the sacral wound. Likewise her left lateral lower extremity ulcer also appears to be doing significantly better. In general I feel like things are showing signs of improvement all around. Electronic  Signature(s) Signed: 10/27/2017 1:39:21 PM By: Lenda Kelp PA-C Entered By: Lenda Kelp on 10/27/2017 13:21:55 Jessica [name] ([id number]) -------------------------------------------------------------------------------- Physical Exam Details [name]. Date of Service: 10/26/2017 11:00 AM Medical Record Number: [id number] Patient Account Number: [id number] Date of Birth/Sex: 12/02/1945 (72 y.o. F) Treating RN: Phillis Haggis Primary Care Provider: Annita Brod Other Clinician: Referring Provider: Annita Brod Treating Provider/Extender: STONE III, HOYT Weeks in Treatment: 21 Constitutional Well-nourished and well-hydrated in no acute distress. Respiratory normal breathing without difficulty. clear to auscultation bilaterally. Cardiovascular regular rate and rhythm with normal S1, S2. no clubbing, cyanosis, significant edema, <3 sec cap refill. Psychiatric this patient is able to make decisions and demonstrates good insight into disease process. Alert and Oriented x 3. pleasant and cooperative. Notes Patient's wound bed actually does show good granulation in regard to the sacral wound and she is developing more good granulation in regard to the left lateral lower  extremity wound as well. In general I think that this is improving quite significantly which is excellent news. With that being said I believe it may be time to switch back to the Wound VAC regard to the sacral wound now that her infection is being treated. Electronic Signature(s) Signed: 10/27/2017 1:39:21 PM By: Lenda Kelp PA-C Entered By: Lenda Kelp on 10/27/2017 13:23:01 Jessica [name] ([id number]) -------------------------------------------------------------------------------- Physician Orders Details [name]. Date of Service: 10/26/2017 11:00 AM Medical Record Number: [id number] Patient Account Number: [id number] Date of Birth/Sex: Apr 06, 1946  (72 y.o. F) Treating RN: Rema Jasmine Primary Care Provider: Annita Brod Other Clinician: Referring Provider: Annita Brod Treating Provider/Extender: Linwood Dibbles, HOYT Weeks in Treatment: 21 Verbal / Phone Orders: Yes Clinician: Rema Jasmine Read Back and Verified: Yes Diagnosis Coding ICD-10 Coding Code Description L89.154 Pressure ulcer of sacral region, stage 4 M48.00 Spinal stenosis, site unspecified R54 Age-related physical debility G89.4 Chronic pain syndrome E78.2 Mixed hyperlipidemia L89.93 Pressure ulcer of unspecified site, stage 3 Wound Cleansing Wound #1 Medial Sacrum o Other: - please cleanse sacral wound with dakins moistened gauze - do not spray dakins on wound Wound #2 Left Lower Leg o Clean wound with Normal Saline. Anesthetic (add to Medication List) Wound #1 Medial Sacrum o Topical Lidocaine 4% cream applied to wound bed prior to debridement (In Clinic Only). Wound #2 Left Lower Leg o Topical Lidocaine 4% cream applied to wound bed prior to debridement (In Clinic Only). Skin Barriers/Peri-Wound Care Wound #2 Left Lower Leg o Skin Prep o Antifungal powder-Nystatin Primary Wound Dressing Wound #1 Medial Sacrum o Alginate - on superficial area to the right side of the wound use until wound vac is placed back on wound o Pack wound with: - Dakin's soaked gauze use until wound vac is placed back on wound Wound #2 Left Lower Leg o Collagen - plain collagen NO SILVER Secondary Dressing Wound #1 Medial Sacrum DAZIAH, HESLER. ([id number]) o ABD pad - secure with tape use until wound vac is placed back on wound o XtraSorb - use until wound vac is placed back on wound Wound #2 Left Lower Leg o Boardered Foam Dressing - or ABD pad and tape Dressing Change Frequency Wound #1 Medial Sacrum o Change dressing every day. Wound #2 Left Lower Leg o Change dressing every day. Follow-up Appointments Wound #1 Medial Sacrum o Return  Appointment in 2 weeks. Wound #2 Left Lower Leg o Return Appointment in 2 weeks. Off-Loading Wound #1 Medial Sacrum o Turn and reposition every 2 hours Wound #2 Left Lower Leg o Turn and reposition every 2 hours Additional Orders / Instructions Wound #1 Medial Sacrum o Increase protein intake. Wound #2 Left Lower Leg o Increase protein intake. Home Health Wound #1 Medial Sacrum o Continue Home Health Visits - Advanced HHRN to start wound vac o Home Health Nurse may visit PRN to address patientos wound care needs. o FACE TO FACE ENCOUNTER: MEDICARE and MEDICAID PATIENTS: I certify that this patient is under my care and that I had a face-to-face encounter that meets the physician face-to-face encounter requirements with this patient on this date. The encounter with the patient was in whole or in part for the following MEDICAL CONDITION: (primary reason for Home Healthcare) MEDICAL NECESSITY: I certify, that based on my findings, NURSING services are a medically necessary home health service. HOME BOUND STATUS: I certify that my clinical findings support that this patient is homebound (i.e., Due to illness or injury,  pt requires aid of supportive devices such as crutches, cane, wheelchairs, walkers, the use of special transportation or the assistance of another person to leave their place of residence. There is a normal inability to leave the home and doing so requires considerable and taxing effort. Other absences are for medical reasons / religious services and are infrequent or of short duration when for other reasons). o If current dressing causes regression in wound condition, may D/C ordered dressing product/s and apply Normal Saline Moist Dressing daily until next Wound Healing Center / Other MD appointment. Notify Wound Healing Center of regression in wound condition at [phone]. o Please direct any NON-WOUND related issues/requests for orders to patient's  Primary Care Physician SULEIMA, OHLENDORF ([id number]) Wound #2 Left Lower Leg o Continue Home Health Visits - Advanced HHRN to start wound vac o Home Health Nurse may visit PRN to address patientos wound care needs. o FACE TO FACE ENCOUNTER: MEDICARE and MEDICAID PATIENTS: I certify that this patient is under my care and that I had a face-to-face encounter that meets the physician face-to-face encounter requirements with this patient on this date. The encounter with the patient was in whole or in part for the following MEDICAL CONDITION: (primary reason for Home Healthcare) MEDICAL NECESSITY: I certify, that based on my findings, NURSING services are a medically necessary home health service. HOME BOUND STATUS: I certify that my clinical findings support that this patient is homebound (i.e., Due to illness or injury, pt requires aid of supportive devices such as crutches, cane, wheelchairs, walkers, the use of special transportation or the assistance of another person to leave their place of residence. There is a normal inability to leave the home and doing so requires considerable and taxing effort. Other absences are for medical reasons / religious services and are infrequent or of short duration when for other reasons). o If current dressing causes regression in wound condition, may D/C ordered dressing product/s and apply Normal Saline Moist Dressing daily until next Wound Healing Center / Other MD appointment. Notify Wound Healing Center of regression in wound condition at [phone]. o Please direct any NON-WOUND related issues/requests for orders to patient's Primary Care Physician Negative Pressure Wound Therapy Wound #1 Medial Sacrum o Wound VAC settings at 125/130 mmHg continuous pressure. Use BLACK/GREEN foam to wound cavity. Use WHITE foam to fill any tunnel/s and/or undermining. Change VAC dressing 3 X WEEK. Change canister as indicated when full. Nurse may titrate  settings and frequency of dressing changes as clinically indicated. - HHRN to start wound vac HHRN to change on Mondays, Wednesdays and every other Friday o Home Health Nurse may d/c VAC for s/s of increased infection, significant wound regression, or uncontrolled drainage. Notify Wound Healing Center at [phone]. o Number of foam/gauze pieces used in the dressing = Patient Medications Allergies: zinc, Ambien Notifications Medication Indication Start End lidocaine DOSE 1 - topical 4 % cream - 1 cream topical Electronic Signature(s) Signed: 10/27/2017 1:39:21 PM By: Lenda Kelp PA-C Signed: 10/27/2017 5:07:56 PM By: Alejandro Mulling Entered By: Alejandro Mulling on 10/26/2017 12:05:52 Jessica [name] ([id number]) -------------------------------------------------------------------------------- Prescription 10/26/2017 [name] Provider: Lenda Kelp PA-C Date of Birth: 1945/10/09 NPI#: [id number] Sex: F DEA#: [id number] Phone #: [phone] License #: Patient Address: [facility] [address] [facility] [place], [place] [address] [address], [address] [place], [place] [address] [phone] Allergies zinc Ambien Medication Medication: Route: Strength: Form: lidocaine 4 %  topical cream topical 4% cream Class: TOPICAL LOCAL ANESTHETICS Dose: Frequency / Time: Indication: 1 1 cream topical Number of Refills: Number of Units: 0 Generic Substitution: Start Date: End Date: One Time Use: Substitution Permitted No Note to Pharmacy: Signature(s): Date(s): Electronic Signature(s) Signed: 10/27/2017 1:39:21 PM By: Lenda Kelp PA-C Signed: 10/27/2017 5:07:56 PM By: Alejandro Mulling Entered By: Alejandro Mulling on 10/26/2017 12:05:53 Jessica [name] ([id number]) Elwyn Reach, Mellody Memos  ([id number]) --------------------------------------------------------------------------------  Problem List Details [name]. Date of Service: 10/26/2017 11:00 AM Medical Record Number: [id number] Patient Account Number: [id number] Date of Birth/Sex: Apr 27, 1945 (72 y.o. F) Treating RN: Phillis Haggis Primary Care Provider: Annita Brod Other Clinician: Referring Provider: Annita Brod Treating Provider/Extender: Linwood Dibbles, HOYT Weeks in Treatment: 21 Active Problems ICD-10 Evaluated Encounter Code Description Active Date Today Diagnosis L89.154 Pressure ulcer of sacral region, stage 4 05/27/2017 No Yes M48.00 Spinal stenosis, site unspecified 05/27/2017 No Yes R54 Age-related physical debility 05/27/2017 No Yes G89.4 Chronic pain syndrome 05/27/2017 No Yes E78.2 Mixed hyperlipidemia 05/27/2017 No Yes L89.93 Pressure ulcer of unspecified site, stage 3 05/27/2017 No Yes Inactive Problems Resolved Problems Electronic Signature(s) Signed: 10/27/2017 1:39:21 PM By: Lenda Kelp PA-C Entered By: Lenda Kelp on 10/26/2017 11:50:09 Jessica [name] ([id number]) -------------------------------------------------------------------------------- Progress Note Details [name]. Date of Service: 10/26/2017 11:00 AM Medical Record Number: [id number] Patient Account Number: [id number] Date of Birth/Sex: 1945/05/06 (72 y.o. F) Treating RN: Phillis Haggis Primary Care Provider: Annita Brod Other Clinician: Referring Provider: Annita Brod Treating Provider/Extender: Linwood Dibbles, HOYT Weeks in Treatment: 21 Subjective Chief Complaint Information obtained from Patient she is here for evaluation of a sacral ulcer and bilateral lower extremity ulcers History of Present Illness (HPI) 05/27/17-she is here in initial evaluation for a left-sided sacral stage IV pressure ulcer and bilateral lower extremity, lateral aspect, unstageable pressure  ulcers. She is accompanied by her husband and her son, who are her primary caregivers. She is bedbound secondary to spinal stenosis. According to her son and husband she was hospitalized from 10/5-11/1 with healthcare associated pneumonia and altered mental status. During her hospitalization she was intubated, extubated on 10/27. She was discharged with Foley catheter and follow up with urology. According to her son and spouse she developed the sacral ulcer during hospitalization. Home health has been applying Santyl daily. She does have a low air loss mattress and is repositioned every 2-3 hours per her family's report. According to the son and spouse she had an appointment with urology on 12/18 and during that appointment developed discoloration to her bilateral lower extremities which ultimately developed into unstageable pressure ulcers to the lateral aspects of her bilateral lower extremities. There has been no topical treatment applied to these. She continues to have home health. There is no concerns expressed regarding dietary intake, stating she eats 3 meals a day, eating was provided; she is supplemented with boost with protein. 06/03/17-she is here in follow-up evaluation for sacral and bilateral lower extremity ulcers. Plain film x-ray done today reveals no distraction to the sacrum or coccyx, no visible abnormalities. Home health has ordered the negative pressure wound system but it has not been initiated. We will continue with Hydrofera Blue until initiation of negative pressure wound system and continue with Santyl to bilateral lower extremity ulcers. Follow-up next week 06/10/17-she is here in follow-up evaluation for sacral and bilateral lower extremity ulcers. The wound VAC will be available tomorrow per home health. We will initiate wound VAC therapy  to the sacral ulcer 3 times weekly (Thursday, Saturday/Sunday, Tuesday). We will continue with Santyl to the lower extremity ulcers.  The patient's son is checking into home health therapy over the weekend for [facility] changes, with the understanding that if VAC changes cannot be performed over the weekend he will need to change his mother's appointments to Monday, Wednesday or Friday. The sacral ulcer clinically does not appear infected but there has been a change in the amount of drainage acutely, there is no significant amount of devitalized tissue, there is no malodor. Wound culture was obtained to evaluate for occult infection we will hold off on antibiotic therapy until sensitivities are resulted. 06/18/17 on evaluation today patient appears to be doing fairly well in my opinion although this is the first time I have seen this patient she has been previously evaluated by Tacey Ruiz here in our office. She is going to be switching to Fridays to see me due to the Wound VAC schedule being changed on Monday, Wednesday, and Friday. Subsequently she seems to be doing fairly well with the Wound VAC. Her son who was present during evaluation today states that he somewhat stresses of the Wound VAC in making sure that it was functioning appropriately. With that being said everything seems to be working well he knows what settings on the [facility] itself to look at and ensure that it is functioning properly. Overall the wound appears to be nice and clean there is no need for debridement today. She has no discomfort in her bilateral lower extremity ulcers also appear to be improving based on measurements and what this honest tell me about the overall appearance. 07/02/17 on evaluation today I noted in patients wound bed that was actually an odor that had not previously been noted. Subsequently there was also a small area of bone that was not previously noted during my last evaluation. This did appear to be necrotic and was being somewhat forced out by the body around the region of granulation. She does not have any pain which is good news. Nonetheless the  overall appearance of the ulcer is making me concerned for the patient having developed osteomyelitis. Currently she is not been on any antibiotics and the Wound VAC has been doing fairly well in general. With that being said I do not think we need to continue the Wound VAC if she potentially has a bone infection. At least not until it is properly addressed with antibiotics. REBEKA, KIMBLE ([id number]) 07/09/17 on evaluation today patient appears to actually be doing very well in regard to her sacral ulcer compared to last week. There is actually no exposed bone at this point. 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 With that being said still I do believe she needs to have a referral to infectious disease due to the osteomyelitis I am gonna recommend that she continue with the doxycycline based on the culture results which showed Eikenella Corrodens as the organism identified that tetracycline should work for. She does not seem to be having any discomfort whatsoever at this point. 07/16/17 on evaluation today patient actually appears to be doing rather well in regard to her sacral wound. I think that the original wound site actually appears much better than previously noted. With that being said she does have a new superficial injury on the right sacral region which  appears to be due to according to the sign transport that occurred for her MRI unfortunately. Fortunately this is not too deep and I do think it can be managed but it was a new area that was not previously noted. Otherwise she has been tolerating the Dakin s soaked gauze packing without complication. 07/30/17 on evaluation today patient presents for reevaluation concerning her sacral wound. She has been tolerating the dressing changes without complication. With that being said  her wound is doing so well at this point that I think she may be at the point where we could reinitiate the Wound VAC currently and hopefully see good results from this. I'm very pleased with how she has [facility]. 08/13/17 evaluation today patient appears to be doing excellent in regard to her lower extremity ulcer this seems to be cleaning up very nicely. In regard to the sacral ulcer the area of trauma on the right lateral portion of the wound actually appears to be much better than previously noted during the last evaluation. The word has definitely filled in and the Wound VAC appears to be helpful I do believe. Overall I'm very pleased with how things seem to be progressing at this time. Patient likewise is also happy that things are doing well. 08/27/17 on evaluation today patient presents for follow-up concerning her ongoing issues with her sacral ulcer and lower extremity ulcer. Fortunately she has been tolerating the dressing changes well complication the Wound VAC in general seems to have done very well up to this point. She does not have any evidence of infection which is good news. She does have some dark discoloration in central portion of the wound which is troublesome for the possibility of pressure injury to the site it sounds like her prior air mattress was not functioning properly her son has just bought a new one for her this is doing much better. Otherwise things seem to be progressing nicely. 09/10/17 on evaluation today patient presents for follow-up concerning her sacral wound and lower extremity ulcer. She has been tolerating the switch of her dressing changes to the Dakin s soaked gauze dressing very well in regard to the sacrum. The left lateral lower extremity ulcer appears to have a new area open just distal to the one that we have been treating with Santyl and this is new since her last visit. There does not appear to be any evidence of  infection which is good news. With that being said there's a lot of maceration here at the site and I feel this is likely due to the switch and the dressings it appears that due to the sticking of the dressings it will switch to utilizing a telfa pad over the Santyl. I think this caused a lot of drainage to be collected and situated in the region just under the dressing which has led to this causing which duration of breakdown. Overall there does not appear to be any significant pain which is good news. Of note I did actually have a conversation with the radiologist who is an interventional radiologist with [facility]. 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 With that being said there does not appear to be any rush to get this done according to the interventional radiologist whom I spoke with. Therefore after discussion it was determined that there gonna wait about six months prior to considering the procedure to  get time for hopefully the sacral wound to heal in the interim to at least some degree. 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 According to nursing staff these are all improved, the negative pressure wound therapy system was put on hold last week. We will resume negative pressure wound therapy today, continue with Santyl to the lower extremity and she will follow-up next week. 09/24/17 on evaluation today unfortunately the patient's wound appears to be doing significantly worse compared to last week's evaluation and even my valuation the week before. She actually has bone noted on the left wound margin in the region of undermining unfortunately. This was not noted during the last evaluation. I am concerned about the fact that this seems to be worse not better since our last visit with her. My biggest concern is that she's likely developing a  worsening osteomyelitis. This was discussed with patient and her son today during the office visit. 05/03/17 on evaluation today patient actually appears to be doing rather well in regard to her sacral ulcer compared to last week's evaluation. She has been tolerating the dressing changes without complication. Fortunately even though we're not utilizing the Wound VAC she seems to be making some strides in seeing the wound area overall improved quite dramatically in my pinion in just one weeks time. She also seems to be staying off of this to the point that I do not see any evidence of new injury which is excellent news. Whatever she's been doing in that regard over the past week I would want her to continue. TAWAN, CORKERN ([id number]) did also review the results of her bone culture which revealed group B strep as the responsible organism. Again this is what's causing her osteomyelitis she does have infectious disease appointment scheduled for 10/11/17 10/08/1898 valuation today patient appears to be doing better for the most part in regard to the sacral wound. Overall she is showing signs of having granulation of the bone which is good news. There is an area towards the left of the wound where she does seem to be showing some signs of opening this it would be due to additional pressure to the area unfortunately. There does not appear to be any evidence of infection spreading which is good news that do not see any evidence of significant purulent discharge which is also good news. 10/26/17 on evaluation today patient sacral ulcer actually appears to be very healthy and doing well in regard to the overall appearance of the wound. With that being said she does seem to be having some issues at this point in time with some shear/friction injury of the sacrum from where she was transported to [facility] for her infectious disease appointment. She has been placed on IV antibiotic therapy I will have to  go back and find that note for review as I did not have access to that today. Nonetheless I will add the next visit be sure to document the exact antibiotics that she is taking at this point. Nonetheless I do believe the wound is doing better there's no bone exposure nothing that requires debridement in regard to the sacral wound. Likewise her left lateral lower extremity ulcer also appears to be doing significantly better. In general I feel like things are showing signs of improvement all around. Patient History Information obtained from Patient. Family History No family history of Cancer, Diabetes, Heart Disease. Social History Never smoker, Marital Status - Married. Review of  Systems (ROS) Constitutional Symptoms (General Health) Denies complaints or symptoms of Fever, Chills. Respiratory The patient has no complaints or symptoms. Cardiovascular The patient has no complaints or symptoms. Psychiatric The patient has no complaints or symptoms. Objective Constitutional Well-nourished and well-hydrated in no acute distress. Vitals Time Taken: 11:25 AM, Height: 70 in, Temperature: 98.0 F, Pulse: 79 bpm, Respiratory Rate: 16 breaths/min, Blood Pressure: 138/67 mmHg. Respiratory normal breathing without difficulty. clear to auscultation bilaterally. Cardiovascular LIBERTA, GIMPEL. ([id number]) regular rate and rhythm with normal S1, S2. no clubbing, cyanosis, significant edema, Psychiatric this patient is able to make decisions and demonstrates good insight into disease process. Alert and Oriented x 3. pleasant and cooperative. General Notes: Patient's wound bed actually does show good granulation in regard to the sacral wound and she is developing more good granulation in regard to the left lateral lower extremity wound as well. In general I think that this is improving quite significantly which is excellent news. With that being said I believe it may be time to switch back to the  Wound VAC regard to the sacral wound now that her infection is being treated. Integumentary (Hair, Skin) Wound #1 status is Open. Original cause of wound was Pressure Injury. The wound is located on the Medial Sacrum. The wound measures 7.9cm length x 6.1cm width x 2.5cm depth; 37.848cm^2 area and 94.621cm^3 volume. There is bone, muscle, and Fat Layer (Subcutaneous Tissue) Exposed exposed. There is no tunneling noted, however, there is undermining starting at 9:00 and ending at 1:00 with a maximum distance of 3.5cm. There is a large amount of purulent drainage noted. Foul odor after cleansing was noted. The wound margin is distinct with the outline attached to the wound base. There is medium (34-66%) red, pink, hyper - granulation within the wound bed. There is a medium (34-66%) amount of necrotic tissue within the wound bed including Eschar and Adherent Slough. The periwound skin appearance exhibited: Induration, Scarring, Rubor, Erythema. The periwound skin appearance did not exhibit: Callus, Crepitus, Excoriation, Rash, Dry/Scaly, Maceration, Atrophie Blanche, Cyanosis, Ecchymosis, Hemosiderin Staining, Mottled, Pallor. The surrounding wound skin color is noted with erythema which is circumferential. Periwound temperature was noted as No Abnormality. The periwound has tenderness on palpation. Wound #2 status is Open. Original cause of wound was Gradually Appeared. The wound is located on the Left Lower Leg. The wound measures 0.7cm length x 0.9cm width x 0.2cm depth; 0.495cm^2 area and 0.099cm^3 volume. There is Fat Layer (Subcutaneous Tissue) Exposed exposed. There is no tunneling or undermining noted. There is a large amount of purulent drainage noted. The wound margin is distinct with the outline attached to the wound base. There is no granulation within the wound bed. There is a medium (34-66%) amount of necrotic tissue within the wound bed including Eschar and Adherent Slough. The  periwound skin appearance did not exhibit: Callus, Crepitus, Excoriation, Induration, Rash, Scarring, Dry/Scaly, Maceration, Atrophie Blanche, Cyanosis, Ecchymosis, Hemosiderin Staining, Mottled, Pallor, Rubor, Erythema. Periwound temperature was noted as No Abnormality. Assessment Active Problems ICD-10 Pressure ulcer of sacral region, stage 4 Spinal stenosis, site unspecified Age-related physical debility Chronic pain syndrome Mixed hyperlipidemia Pressure ulcer of unspecified site, stage 3 Plan Wound Cleansing: Wound #1 Medial Sacrum: Other: - please cleanse sacral wound with dakins moistened gauze - do not spray dakins on wound TIERA, MENSINGER. ([id number]) Wound #2 Left Lower Leg: Clean wound with Normal Saline. Anesthetic (add to Medication List): Wound #1 Medial Sacrum: Topical Lidocaine 4% cream applied to wound bed  prior to debridement (In Clinic Only). Wound #2 Left Lower Leg: Topical Lidocaine 4% cream applied to wound bed prior to debridement (In Clinic Only). Skin Barriers/Peri-Wound Care: Wound #2 Left Lower Leg: Skin Prep Antifungal powder-Nystatin Primary Wound Dressing: Wound #1 Medial Sacrum: Alginate - on superficial area to the right side of the wound use until wound vac is placed back on wound Pack wound with: - Dakin's soaked gauze use until wound vac is placed back on wound Wound #2 Left Lower Leg: Collagen - plain collagen NO SILVER Secondary Dressing: Wound #1 Medial Sacrum: ABD pad - secure with tape use until wound vac is placed back on wound XtraSorb - use until wound vac is placed back on wound Wound #2 Left Lower Leg: Boardered Foam Dressing - or ABD pad and tape Dressing Change Frequency: Wound #1 Medial Sacrum: Change dressing every day. Wound #2 Left Lower Leg: Change dressing every day. Follow-up Appointments: Wound #1 Medial Sacrum: Return Appointment in 2 weeks. Wound #2 Left Lower Leg: Return Appointment in 2  weeks. Off-Loading: Wound #1 Medial Sacrum: Turn and reposition every 2 hours Wound #2 Left Lower Leg: Turn and reposition every 2 hours Additional Orders / Instructions: Wound #1 Medial Sacrum: Increase protein intake. Wound #2 Left Lower Leg: Increase protein intake. Home Health: Wound #1 Medial Sacrum: Continue Home Health Visits - Advanced HHRN to start wound vac Home Health Nurse may visit PRN to address patient s wound care needs. FACE TO FACE ENCOUNTER: MEDICARE and MEDICAID PATIENTS: I certify that this patient is under my care and that I had a face-to-face encounter that meets the physician face-to-face encounter requirements with this patient on this date. The encounter with the patient was in whole or in part for the following MEDICAL CONDITION: (primary reason for Home Healthcare) MEDICAL NECESSITY: I certify, that based on my findings, NURSING services are a medically necessary home health service. HOME BOUND STATUS: I certify that my clinical findings support that this patient is homebound (i.e., Due to illness or injury, pt requires aid of supportive devices such as crutches, cane, wheelchairs, walkers, the use of special transportation or the assistance of another person to leave their place of residence. There is a normal inability to leave the home and doing so requires considerable and taxing effort. Other absences are for medical reasons / religious services and are infrequent or of short duration when for other reasons). If current dressing causes regression in wound condition, may D/C ordered dressing product/s and apply Normal Saline Moist Dressing daily until next Wound Healing Center / Other MD appointment. Notify Wound Healing Center of regression in wound condition at [phone]. Please direct any NON-WOUND related issues/requests for orders to patient's Primary Care Physician DANAI, GOTTO ([id number]) Wound #2 Left Lower Leg: Continue Home Health  Visits - Advanced HHRN to start wound vac Home Health Nurse may visit PRN to address patient s wound care needs. FACE TO FACE ENCOUNTER: MEDICARE and MEDICAID PATIENTS: I certify that this patient is under my care and that I had a face-to-face encounter that meets the physician face-to-face encounter requirements with this patient on this date. The encounter with the patient was in whole or in part for the following MEDICAL CONDITION: (primary reason for Home Healthcare) MEDICAL NECESSITY: I certify, that based on my findings, NURSING services are a medically necessary home health service. HOME BOUND STATUS: I certify that my clinical findings support that this patient is homebound (i.e., Due to illness or injury, pt requires  aid of supportive devices such as crutches, cane, wheelchairs, walkers, the use of special transportation or the assistance of another person to leave their place of residence. There is a normal inability to leave the home and doing so requires considerable and taxing effort. Other absences are for medical reasons / religious services and are infrequent or of short duration when for other reasons). If current dressing causes regression in wound condition, may D/C ordered dressing product/s and apply Normal Saline Moist Dressing daily until next Wound Healing Center / Other MD appointment. Notify Wound Healing Center of regression in wound condition at [phone]. Please direct any NON-WOUND related issues/requests for orders to patient's Primary Care Physician Negative Pressure Wound Therapy: Wound #1 Medial Sacrum: Wound VAC settings at 125/130 mmHg continuous pressure. Use BLACK/GREEN foam to wound cavity. Use WHITE foam to fill any tunnel/s and/or undermining. Change VAC dressing 3 X WEEK. Change canister as indicated when full. Nurse may titrate settings and frequency of dressing changes as clinically indicated. - HHRN to start wound vac HHRN to change on Mondays,  Wednesdays and every other Friday Home Health Nurse may d/c VAC for s/s of increased infection, significant wound regression, or uncontrolled drainage. Notify Wound Healing Center at [phone]. Number of foam/gauze pieces used in the dressing = The following medication(s) was prescribed: lidocaine topical 4 % cream 1 1 cream topical was prescribed at facility There was no location of debridement needed as far as the sacral wound was concerned today and therefore obviously no debridement was performed. I am gonna order that the Wound VAC be replaced by home health and we will see how things do with this over the next two weeks. Patient and her son are in agreement with the plan. Anything changes they will let me know. Please see above for specific wound care orders. We will see patient for re-evaluation in two week(s) here in the clinic. If anything worsens or changes patient will contact our office for additional recommendations. Electronic Signature(s) Signed: 10/27/2017 1:39:21 PM By: Lenda Kelp PA-C Entered By: Lenda Kelp on 10/27/2017 13:23:21 Jessica [name] ([id number]) -------------------------------------------------------------------------------- ROS/PFSH Details [name]. Date of Service: 10/26/2017 11:00 AM Medical Record Number: [id number] Patient Account Number: [id number] Date of Birth/Sex: 04-Jun-1945 (72 y.o. F) Treating RN: Phillis Haggis Primary Care Provider: Annita Brod Other Clinician: Referring Provider: Annita Brod Treating Provider/Extender: STONE III, HOYT Weeks in Treatment: 21 Information Obtained From Patient Wound History Constitutional Symptoms (General Health) Complaints and Symptoms: Negative for: Fever; Chills Respiratory Complaints and Symptoms: No Complaints or Symptoms Cardiovascular Complaints and Symptoms: No Complaints or Symptoms Psychiatric Complaints and Symptoms: No Complaints or  Symptoms Immunizations Pneumococcal Vaccine: Received Pneumococcal Vaccination: No Implantable Devices Family and Social History Cancer: No; Diabetes: No; Heart Disease: No; Never smoker; Marital Status - Married Physician Affirmation I have reviewed and agree with the above information. Electronic Signature(s) Signed: 10/27/2017 1:39:21 PM By: Lenda Kelp PA-C Signed: 10/27/2017 5:07:56 PM By: Alejandro Mulling Entered By: Lenda Kelp on 10/27/2017 13:22:21 Jessica [name] ([id number]) -------------------------------------------------------------------------------- SuperBill Details [name]. Date of Service: 10/26/2017 Medical Record Number: [id number] Patient Account Number: [id number] Date of Birth/Sex: October 04, 1945 (72 y.o. 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 Pressure ulcer of sacral region, stage 4 M48.00 Spinal stenosis, site unspecified R54 Age-related physical debility G89.4  Chronic pain syndrome E78.2 Mixed hyperlipidemia L89.93 Pressure ulcer of unspecified site, stage 3 Facility Procedures CPT4 Code: 16109604 Description: 99214 - WOUND CARE VISIT-LEV 4 EST PT Modifier: Quantity: 1 Physician Procedures CPT4 Code: 5409811 Description: 99213 - WC PHYS LEVEL 3 - EST PT ICD-10 Diagnosis Description 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

## 2017-10-28 NOTE — Progress Notes (Signed)
HETHER, ANSELMO ([id number]) Visit Report for 10/26/2017 Arrival Information Details [name], Jessica [name]. Date of Service: 10/26/2017 11:00 AM Medical Record Number: [id number] Patient Account Number: [id number] Date of Birth/Sex: 02-23-1946 (72 y.o. F) Treating RN: Rema Jasmine Primary Care Alize Borrayo: Annita Brod Other Clinician: Referring Lillee Mooneyhan: Annita Brod Treating Irma Delancey/Extender: Linwood Dibbles, HOYT Weeks in Treatment: 21 Visit Information History Since Last Visit Added or deleted any medications: No Patient Arrived: Stretcher Any new allergies or adverse reactions: No Arrival Time: 11:28 Had a fall or experienced change in No Accompanied By: son activities of daily living that may affect Transfer Assistance: Other risk of falls: Patient Identification Verified: Yes Signs or symptoms of abuse/neglect since last visito No Secondary Verification Process Yes Hospitalized since last visit: No Completed: Implantable device outside of the clinic excluding No Patient Has Alerts: Yes cellular tissue based products placed in the center Patient Alerts: ALLERGIC TO since last visit: ZINC Has Dressing in Place as Prescribed: Yes Pain Present Now: No Electronic Signature(s) Signed: 10/26/2017 3:15:28 PM By: Rema Jasmine Entered By: Rema Jasmine on 10/26/2017 11:29:18 Jessica [name] ([id number]) -------------------------------------------------------------------------------- Clinic Level of Care Assessment Details [name]. Date of Service: 10/26/2017 11:00 AM Medical Record Number: [id number] Patient Account Number: [id number] Date of Birth/Sex: 12/31/1945 (72 y.o. F) Treating RN: Phillis Haggis Primary Care Dorothia Passmore: Annita Brod Other Clinician: Referring Malaina Mortellaro: Annita Brod Treating Karigan Cloninger/Extender: Linwood Dibbles, HOYT Weeks in Treatment: 21 Clinic Level of Care Assessment Items TOOL 4 Quantity Score X - Use when only an EandM is  performed on FOLLOW-UP visit 1 0 ASSESSMENTS - Nursing Assessment / Reassessment X - Reassessment of Co-morbidities (includes updates in patient status) 1 10 X- 1 5 Reassessment of Adherence to Treatment Plan ASSESSMENTS - Wound and Skin Assessment / Reassessment []  - Simple Wound Assessment / Reassessment - one wound 0 X- 2 5 Complex Wound Assessment / Reassessment - multiple wounds []  - 0 Dermatologic / Skin Assessment (not related to wound area) ASSESSMENTS - Focused Assessment []  - Circumferential Edema Measurements - multi extremities 0 []  - 0 Nutritional Assessment / Counseling / Intervention []  - 0 Lower Extremity Assessment (monofilament, tuning fork, pulses) []  - 0 Peripheral Arterial Disease Assessment (using hand held doppler) ASSESSMENTS - Ostomy and/or Continence Assessment and Care []  - Incontinence Assessment and Management 0 []  - 0 Ostomy Care Assessment and Management (repouching, etc.) PROCESS - Coordination of Care []  - Simple Patient / Family Education for ongoing care 0 X- 1 20 Complex (extensive) Patient / Family Education for ongoing care X- 1 10 Staff obtains Chiropractor, Records, Test Results / Process Orders X- 1 10 Staff telephones HHA, Nursing Homes / Clarify orders / etc []  - 0 Routine Transfer to another Facility (non-emergent condition) []  - 0 Routine Hospital Admission (non-emergent condition) []  - 0 New Admissions / Manufacturing engineer / Ordering NPWT, Apligraf, etc. []  - 0 Emergency Hospital Admission (emergent condition) X- 1 10 Simple Discharge Coordination DONATA, REDDICK. ([id number]) []  - 0 Complex (extensive) Discharge Coordination PROCESS - Special Needs []  - Pediatric / Minor Patient Management 0 []  - 0 Isolation Patient Management []  - 0 Hearing / Language / Visual special needs []  - 0 Assessment of Community assistance (transportation, D/C planning, etc.) []  - 0 Additional assistance / Altered mentation []  -  0 Support Surface(s) Assessment (bed, cushion, seat, etc.) INTERVENTIONS - Wound Cleansing / Measurement []  - Simple Wound Cleansing - one wound 0 X- 2 5 Complex Wound Cleansing -  multiple wounds X- 1 5 Wound Imaging (photographs - any number of wounds) []  - 0 Wound Tracing (instead of photographs) []  - 0 Simple Wound Measurement - one wound X- 2 5 Complex Wound Measurement - multiple wounds INTERVENTIONS - Wound Dressings X - Small Wound Dressing one or multiple wounds 1 10 X- 1 15 Medium Wound Dressing one or multiple wounds []  - 0 Large Wound Dressing one or multiple wounds X- 1 5 Application of Medications - topical []  - 0 Application of Medications - injection INTERVENTIONS - Miscellaneous []  - External ear exam 0 []  - 0 Specimen Collection (cultures, biopsies, blood, body fluids, etc.) []  - 0 Specimen(s) / Culture(s) sent or taken to Lab for analysis []  - 0 Patient Transfer (multiple staff / Nurse, adultHoyer Lift / Similar devices) []  - 0 Simple Staple / Suture removal (25 or less) []  - 0 Complex Staple / Suture removal (26 or more) []  - 0 Hypo / Hyperglycemic Management (close monitor of Blood Glucose) []  - 0 Ankle / Brachial Index (ABI) - do not check if billed separately X- 1 5 Vital Signs Riojas, Shaunice W. ([id number]) Has the patient been seen at the hospital within the last three years: Yes Total Score: 135 Level Of Care: New/Established - Level 4 Electronic Signature(s) Signed: 10/27/2017 5:07:56 PM By: Alejandro MullingPinkerton, Debra Entered By: Alejandro MullingPinkerton, Debra on 10/26/2017 17:26:41 Jessica BradyVERMAN, Deneen W. ([id number]) -------------------------------------------------------------------------------- Encounter Discharge Information Details Patient Name: Jessica BradyVERMAN, [name] Date of Service: 10/26/2017 11:00 AM Medical Record Number: [id number] Patient Account Number: [id number] Date of Birth/Sex: 19-Jul-1945 (72 y.o. F) Treating RN: Renne CriglerFlinchum, Cheryl Primary Care Yvonnie Schinke: Annita BrodASENSO,  PHILIP Other Clinician: Referring Emaline Karnes: Annita BrodASENSO, PHILIP Treating Kevion Fatheree/Extender: Linwood DibblesSTONE III, HOYT Weeks in Treatment: 21 Encounter Discharge Information Items Discharge Condition: Stable Ambulatory Status: Stretcher Discharge Destination: Home Transportation: Ambulance Schedule Follow-up Appointment: Yes Clinical Summary of Care: Electronic Signature(s) Signed: 10/26/2017 4:58:15 PM By: Renne CriglerFlinchum, Cheryl Entered By: Renne CriglerFlinchum, Cheryl on 10/26/2017 12:13:05 Jessica BradyVERMAN, Hiya W. ([id number]) -------------------------------------------------------------------------------- Lower Extremity Assessment Details Patient Name: Jessica BradyVERMAN, Keysha W. Date of Service: 10/26/2017 11:00 AM Medical Record Number: [id number] Patient Account Number: [id number] Date of Birth/Sex: 19-Jul-1945 (72 y.o. F) Treating RN: Rema JasmineNg, Wendi Primary Care Zaire Levesque: Annita BrodASENSO, PHILIP Other Clinician: Referring Manroop Jakubowicz: Annita BrodASENSO, PHILIP Treating Anshi Jalloh/Extender: STONE III, HOYT Weeks in Treatment: 21 Edema Assessment Assessed: [Left: No] [Right: No] Edema: [Left: N] [Right: o] Calf Left: Right: Point of Measurement: 35 cm From Medial Instep 35 cm cm Ankle Left: Right: Point of Measurement: 15 cm From Medial Instep 21 cm cm Vascular Assessment Pulses: Dorsalis Pedis Palpable: [Left:Yes] Posterior Tibial Extremity colors, hair growth, and conditions: Extremity Color: [Left:Normal] Temperature of Extremity: [Left:Warm] Capillary Refill: [Left:< 3 seconds] Toe Nail Assessment Left: Right: Thick: Yes Discolored: Yes Deformed: Yes Improper Length and Hygiene: Yes Electronic Signature(s) Signed: 10/26/2017 3:15:28 PM By: Rema JasmineNg, Wendi Entered By: Rema JasmineNg, Wendi on 10/26/2017 11:37:19 Jessica BradyVERMAN, Amiylah W. ([id number]) -------------------------------------------------------------------------------- Multi Wound Chart Details Patient Name: Jessica BradyVERMAN, Eisha W. Date of Service: 10/26/2017 11:00 AM Medical Record Number:  [id number] Patient Account Number: [id number] Date of Birth/Sex: 19-Jul-1945 (72 y.o. F) Treating RN: Rema JasmineNg, Wendi Primary Care Ocean Schildt: Annita BrodASENSO, PHILIP Other Clinician: Referring Gaylin Bulthuis: Annita BrodASENSO, PHILIP Treating Latrise Bowland/Extender: STONE III, HOYT Weeks in Treatment: 21 Vital Signs Height(in): 70 Pulse(bpm): 79 Weight(lbs): Blood Pressure(mmHg): 138/67 Body Mass Index(BMI): Temperature(F): 98.0 Respiratory Rate 16 (breaths/min): Photos: [1:No Photos] [2:No Photos] [N/A:N/A] Wound Location: [1:Sacrum - Medial] [2:Left Lower Leg] [N/A:N/A] Wounding Event: [1:Pressure Injury] [2:Gradually Appeared] [N/A:N/A] Primary Etiology: [1:Pressure Ulcer] [2:Pressure Ulcer] [N/A:N/A] Date  Acquired: [1:01/15/2017] [2:04/28/2017] [N/A:N/A] Weeks of Treatment: [1:21] [2:21] [N/A:N/A] Wound Status: [1:Open] [2:Open] [N/A:N/A] Measurements L x W x D [1:7.9x6.1x2.5] [2:1.1x0.9x0.2] [N/A:N/A] (cm) Area (cm) : [1:37.848] [2:0.778] [N/A:N/A] Volume (cm) : [1:94.621] [2:0.156] [N/A:N/A] % Reduction in Area: [1:-2.70%] [2:-65.20%] [N/A:N/A] % Reduction in Volume: [1:-156.80%] [2:-66.00%] [N/A:N/A] Starting Position 1 [1:9] (o'clock): Ending Position 1 [1:1] (o'clock): Maximum Distance 1 (cm): [1:3.5] Undermining: [1:Yes] [2:No] [N/A:N/A] Classification: [1:Category/Stage IV] [2:Category/Stage III] [N/A:N/A] Exudate Amount: [1:Large] [2:Large] [N/A:N/A] Exudate Type: [1:Purulent] [2:Purulent] [N/A:N/A] Exudate Color: [1:yellow, brown, green] [2:yellow, brown, green] [N/A:N/A] Foul Odor After Cleansing: [1:Yes] [2:No] [N/A:N/A] Odor Anticipated Due to [1:No] [2:N/A] [N/A:N/A] Product Use: Wound Margin: [1:Distinct, outline attached] [2:Distinct, outline attached] [N/A:N/A] Granulation Amount: [1:Medium (34-66%)] [2:None Present (0%)] [N/A:N/A] Granulation Quality: [1:Red, Pink, Hyper-granulation] [2:N/A] [N/A:N/A] Necrotic Amount: [1:Medium (34-66%)] [2:Medium (34-66%)] [N/A:N/A] Necrotic Tissue:  [1:Eschar, Adherent Slough] [2:Eschar, Adherent Slough] [N/A:N/A] Exposed Structures: [1:Fat Layer (Subcutaneous Tissue) Exposed: Yes Muscle: Yes Bone: Yes Fascia: No] [2:Fat Layer (Subcutaneous Tissue) Exposed: Yes Fascia: No Tendon: No Muscle: No] [N/A:N/A] Tendon: No Joint: No Joint: No Bone: No Epithelialization: None Small (1-33%) N/A Periwound Skin Texture: Induration: Yes Excoriation: No N/A Scarring: Yes Induration: No Excoriation: No Callus: No Callus: No Crepitus: No Crepitus: No Rash: No Rash: No Scarring: No Periwound Skin Moisture: Maceration: No Maceration: No N/A Dry/Scaly: No Dry/Scaly: No Periwound Skin Color: Erythema: Yes Atrophie Blanche: No N/A Rubor: Yes Cyanosis: No Atrophie Blanche: No Ecchymosis: No Cyanosis: No Erythema: No Ecchymosis: No Hemosiderin Staining: No Hemosiderin Staining: No Mottled: No Mottled: No Pallor: No Pallor: No Rubor: No Erythema Location: Circumferential N/A N/A Temperature: No Abnormality No Abnormality N/A Tenderness on Palpation: Yes No N/A Wound Preparation: Ulcer Cleansing: Ulcer Cleansing: N/A Rinsed/Irrigated with Saline Rinsed/Irrigated with Saline Topical Anesthetic Applied: Topical Anesthetic Applied: Other: lidocaine 4% Other: lidocaine 4% Treatment Notes Electronic Signature(s) Signed: 10/26/2017 3:15:28 PM By: Rema Jasmine Entered By: Rema Jasmine on 10/26/2017 11:54:38 Jessica [name] ([id number]) -------------------------------------------------------------------------------- Multi-Disciplinary Care Plan Details [name]. Date of Service: 10/26/2017 11:00 AM Medical Record Number: [id number] Patient Account Number: [id number] Date of Birth/Sex: 22-Dec-1945 (72 y.o. F) Treating RN: Rema Jasmine Primary Care Izsak Meir: Annita Brod Other Clinician: Referring Shirla Hodgkiss: Annita Brod Treating Renae Mottley/Extender: STONE III, HOYT Weeks in Treatment: 21 Active  Inactive ` Orientation to the Wound Care Program Nursing Diagnoses: Knowledge deficit related to the wound healing center program Goals: Patient/caregiver will verbalize understanding of the Wound Healing Center Program Date Initiated: 06/03/2017 Target Resolution Date: 06/17/2017 Goal Status: Active Interventions: Provide education on orientation to the wound center Notes: ` Pressure Nursing Diagnoses: Knowledge deficit related to causes and risk factors for pressure ulcer development Knowledge deficit related to management of pressures ulcers Potential for impaired tissue integrity related to pressure, friction, moisture, and shear Goals: Patient will remain free from development of additional pressure ulcers Date Initiated: 06/03/2017 Target Resolution Date: 06/17/2017 Goal Status: Active Patient will remain free of pressure ulcers Date Initiated: 06/03/2017 Target Resolution Date: 06/17/2017 Goal Status: Active Patient/caregiver will verbalize risk factors for pressure ulcer development Date Initiated: 06/03/2017 Target Resolution Date: 06/17/2017 Goal Status: Active Interventions: Assess: immobility, friction, shearing, incontinence upon admission and as needed Assess potential for pressure ulcer upon admission and as needed Notes: ` Wound/Skin Impairment ZARIELLE, CEA ([id number]) Nursing Diagnoses: Impaired tissue integrity Goals: Patient/caregiver will verbalize understanding of skin care regimen Date Initiated: 06/03/2017 Target Resolution Date: 06/17/2017 Goal Status: Active Ulcer/skin breakdown will have a volume reduction of 30% by week  4 Date Initiated: 06/03/2017 Target Resolution Date: 06/17/2017 Goal Status: Active Interventions: Assess ulceration(s) every visit Treatment Activities: Patient referred to home care : 06/03/2017 Skin care regimen initiated : 06/03/2017 Notes: Electronic Signature(s) Signed: 10/26/2017 3:15:28 PM By: Rema Jasmine Entered By: Rema Jasmine on 10/26/2017 11:54:30 Jessica [name] ([id number]) -------------------------------------------------------------------------------- Pain Assessment Details [name]. Date of Service: 10/26/2017 11:00 AM Medical Record Number: [id number] Patient Account Number: [id number] Date of Birth/Sex: 14-Nov-1945 (72 y.o. F) Treating RN: Rema Jasmine Primary Care Evetta Renner: Annita Brod Other Clinician: Referring Yaniel Limbaugh: Annita Brod Treating Israa Caban/Extender: STONE III, HOYT Weeks in Treatment: 21 Active Problems Location of Pain Severity and Description of Pain Patient Has Paino No Site Locations Pain Management and Medication Current Pain Management: Electronic Signature(s) Signed: 10/26/2017 3:15:28 PM By: Rema Jasmine Entered By: Rema Jasmine on 10/26/2017 11:29:30 Jessica [name] ([id number]) -------------------------------------------------------------------------------- Patient/Caregiver Education Details [name]. Date of Service: 10/26/2017 11:00 AM Medical Record Number: [id number] Patient Account Number: [id number] Date of Birth/Gender: September 17, 1945 (72 y.o. F) Treating RN: Renne Crigler Primary Care Physician: Annita Brod Other Clinician: Referring Physician: Annita Brod Treating Physician/Extender: Skeet Simmer in Treatment: 21 Education Assessment Education Provided To: Patient Education Topics Provided Wound/Skin Impairment: Handouts: Caring for Your Ulcer Methods: Explain/Verbal Responses: State content correctly Electronic Signature(s) Signed: 10/26/2017 4:58:15 PM By: Renne Crigler Entered By: Renne Crigler on 10/26/2017 12:13:21 Jessica [name] ([id number]) -------------------------------------------------------------------------------- Wound Assessment Details [name]. Date of Service: 10/26/2017 11:00 AM Medical Record Number: [id number] Patient Account Number:  [id number] Date of Birth/Sex: 1946/01/23 (72 y.o. F) Treating RN: Rema Jasmine Primary Care Tamkia Temples: Annita Brod Other Clinician: Referring Jesslyn Viglione: Annita Brod Treating Jozsef Wescoat/Extender: STONE III, HOYT Weeks in Treatment: 21 Wound Status Wound Number: 1 Primary Etiology: Pressure Ulcer Wound Location: Sacrum - Medial Wound Status: Open Wounding Event: Pressure Injury Date Acquired: 01/15/2017 Weeks Of Treatment: 21 Clustered Wound: No Photos Photo Uploaded By: Rema Jasmine on 10/26/2017 12:24:52 Wound Measurements Length: (cm) 7.9 % Reductio Width: (cm) 6.1 % Reductio Depth: (cm) 2.5 Epithelial Area: (cm) 37.848 Tunneling Volume: (cm) 94.621 Undermini Startin Ending Maximum n in Area: -2.7% n in Volume: -156.8% ization: None : No ng: Yes g Position (o'clock): 9 Position (o'clock): 1 Distance: (cm) 3.5 Wound Description Classification: Category/Stage IV Foul Odor Wound Margin: Distinct, outline attached Due to Pro Exudate Amount: Large Slough/Fib Exudate Type: Purulent Exudate Color: yellow, brown, green After Cleansing: Yes duct Use: No rino Yes Wound Bed Granulation Amount: Medium (34-66%) Exposed Structure Granulation Quality: Red, Pink, Hyper-granulation Fascia Exposed: No Necrotic Amount: Medium (34-66%) Fat Layer (Subcutaneous Tissue) Exposed: Yes Necrotic Quality: Eschar, Adherent Slough Tendon Exposed: No Muscle Exposed: Yes Necrosis of Muscle: No Joint Exposed: No Bone Exposed: Yes AIRIS, BARBEE. ([id number]) Periwound Skin Texture Texture Color No Abnormalities Noted: No No Abnormalities Noted: No Callus: No Atrophie Blanche: No Crepitus: No Cyanosis: No Excoriation: No Ecchymosis: No Induration: Yes Erythema: Yes Rash: No Erythema Location: Circumferential Scarring: Yes Hemosiderin Staining: No Mottled: No Moisture Pallor: No No Abnormalities Noted: No Rubor: Yes Dry / Scaly: No Maceration: No Temperature /  Pain Temperature: No Abnormality Tenderness on Palpation: Yes Wound Preparation Ulcer Cleansing: Rinsed/Irrigated with Saline Topical Anesthetic Applied: Other: lidocaine 4%, Treatment Notes Wound #1 (Medial Sacrum) 1. Cleansed with: Clean wound with Normal Saline 2. Anesthetic Topical Lidocaine 4% cream to wound bed prior to debridement 5. Secondary Dressing Applied ABD Pad Notes ABD, dakins wet to dry gauze, xtrasorb Electronic  Signature(s) Signed: 10/26/2017 3:15:28 PM By: Rema Jasmine Entered By: Rema Jasmine on 10/26/2017 11:49:47 Jessica [name] ([id number]) -------------------------------------------------------------------------------- Wound Assessment Details [name]. Date of Service: 10/26/2017 11:00 AM Medical Record Number: [id number] Patient Account Number: [id number] Date of Birth/Sex: 1945-07-13 (72 y.o. F) Treating RN: Phillis Haggis Primary Care Jessica Seidman: Annita Brod Other Clinician: Referring Demaria Deeney: Annita Brod Treating Kamla Skilton/Extender: STONE III, HOYT Weeks in Treatment: 21 Wound Status Wound Number: 2 Primary Etiology: Pressure Ulcer Wound Location: Left Lower Leg Wound Status: Open Wounding Event: Gradually Appeared Date Acquired: 04/28/2017 Weeks Of Treatment: 21 Clustered Wound: No Photos Photo Uploaded By: Rema Jasmine on 10/26/2017 12:25:22 Wound Measurements Length: (cm) 0.7 Width: (cm) 0.9 Depth: (cm) 0.2 Area: (cm) 0.495 Volume: (cm) 0.099 % Reduction in Area: -5.1% % Reduction in Volume: -5.3% Epithelialization: Small (1-33%) Tunneling: No Undermining: No Wound Description Classification: Category/Stage III Wound Margin: Distinct, outline attached Exudate Amount: Large Exudate Type: Purulent Exudate Color: yellow, brown, green Foul Odor After Cleansing: No Slough/Fibrino Yes Wound Bed Granulation Amount: None Present (0%) Exposed Structure Necrotic Amount: Medium (34-66%) Fascia Exposed:  No Necrotic Quality: Eschar, Adherent Slough Fat Layer (Subcutaneous Tissue) Exposed: Yes Tendon Exposed: No Muscle Exposed: No Joint Exposed: No Bone Exposed: No Periwound Skin Texture Texture Color No Abnormalities Noted: No No Abnormalities Noted: No Callus: No Atrophie Blanche: No JOHNA, KEARL. ([id number]) Crepitus: No Cyanosis: No Excoriation: No Ecchymosis: No Induration: No Erythema: No Rash: No Hemosiderin Staining: No Scarring: No Mottled: No Pallor: No Moisture Rubor: No No Abnormalities Noted: No Dry / Scaly: No Temperature / Pain Maceration: No Temperature: No Abnormality Wound Preparation Ulcer Cleansing: Rinsed/Irrigated with Saline Topical Anesthetic Applied: Other: lidocaine 4%, Treatment Notes Wound #2 (Left Lower Leg) 1. Cleansed with: Clean wound with Normal Saline 2. Anesthetic Topical Lidocaine 4% cream to wound bed prior to debridement 4. Dressing Applied: Aquacel 5. Secondary Dressing Applied Bordered Foam Dressing Electronic Signature(s) Signed: 10/27/2017 5:07:56 PM By: Alejandro Mulling Entered By: Alejandro Mulling on 10/26/2017 11:57:58 Jessica [name] ([id number]) -------------------------------------------------------------------------------- Vitals Details [name]. Date of Service: 10/26/2017 11:00 AM Medical Record Number: [id number] Patient Account Number: [id number] Date of Birth/Sex: [birth year] (72 y.o. F) Treating RN: Rema Jasmine Primary Care Elmer Boutelle: Annita Brod Other Clinician: Referring Magdalyn Arenivas: Annita Brod Treating Kelwin Gibler/Extender: STONE III, HOYT Weeks in Treatment: 21 Vital Signs Time Taken: 11:25 Temperature (F): 98.0 Height (in): 70 Pulse (bpm): 79 Respiratory Rate (breaths/min): 16 Blood Pressure (mmHg): 138/67 Reference Range: 80 - 120 mg / dl Electronic Signature(s) Signed: 10/26/2017 3:15:28 PM By: Rema Jasmine Entered By: Rema Jasmine on 10/26/2017 11:30:23

## 2017-10-29 ENCOUNTER — Other Ambulatory Visit: Payer: Self-pay | Admitting: Pharmacist

## 2017-10-29 NOTE — Progress Notes (Signed)
[facility] pharmacy lab review  

## 2017-11-01 ENCOUNTER — Encounter: Payer: Self-pay | Admitting: Infectious Diseases

## 2017-11-02 ENCOUNTER — Telehealth: Payer: Self-pay | Admitting: *Deleted

## 2017-11-02 NOTE — Telephone Encounter (Signed)
Per Judeth CornfieldStephanie called the patient and set up a follow up visit as she is in IV PICC medication. They are able to come 11/23/17 at 1130 and will see Marcos EkeGreg Calone NP.

## 2017-11-03 ENCOUNTER — Encounter: Payer: Self-pay | Admitting: Infectious Diseases

## 2017-11-03 ENCOUNTER — Other Ambulatory Visit: Payer: Self-pay | Admitting: Pharmacist

## 2017-11-03 NOTE — Progress Notes (Signed)
[facility] pharmacy lab review  

## 2017-11-08 ENCOUNTER — Encounter: Payer: Self-pay | Admitting: Infectious Diseases

## 2017-11-09 ENCOUNTER — Ambulatory Visit: Payer: Self-pay | Admitting: Physician Assistant

## 2017-11-12 ENCOUNTER — Emergency Department: Payer: Medicare Other

## 2017-11-12 ENCOUNTER — Other Ambulatory Visit: Payer: Self-pay

## 2017-11-12 ENCOUNTER — Encounter: Payer: Self-pay | Admitting: Infectious Diseases

## 2017-11-12 ENCOUNTER — Encounter: Payer: Medicare Other | Attending: Physician Assistant | Admitting: Physician Assistant

## 2017-11-12 ENCOUNTER — Other Ambulatory Visit: Payer: Self-pay | Admitting: Pharmacist

## 2017-11-12 ENCOUNTER — Emergency Department
Admission: EM | Admit: 2017-11-12 | Discharge: 2017-11-12 | Disposition: A | Discharge status: Home / Self Care | Payer: Medicare Other | Attending: Emergency Medicine | Admitting: Emergency Medicine

## 2017-11-12 DIAGNOSIS — Z7401 Bed confinement status: Secondary | ICD-10-CM | POA: Insufficient documentation

## 2017-11-12 DIAGNOSIS — Z7982 Long term (current) use of aspirin: Secondary | ICD-10-CM | POA: Insufficient documentation

## 2017-11-12 DIAGNOSIS — T82898A Other specified complication of vascular prosthetic devices, implants and grafts, initial encounter: Secondary | ICD-10-CM | POA: Diagnosis present

## 2017-11-12 DIAGNOSIS — E039 Hypothyroidism, unspecified: Secondary | ICD-10-CM | POA: Diagnosis not present

## 2017-11-12 DIAGNOSIS — E782 Mixed hyperlipidemia: Secondary | ICD-10-CM | POA: Insufficient documentation

## 2017-11-12 DIAGNOSIS — Y69 Unspecified misadventure during surgical and medical care: Secondary | ICD-10-CM | POA: Diagnosis not present

## 2017-11-12 DIAGNOSIS — G894 Chronic pain syndrome: Secondary | ICD-10-CM | POA: Insufficient documentation

## 2017-11-12 DIAGNOSIS — I5030 Unspecified diastolic (congestive) heart failure: Secondary | ICD-10-CM | POA: Diagnosis not present

## 2017-11-12 DIAGNOSIS — Z79899 Other long term (current) drug therapy: Secondary | ICD-10-CM | POA: Insufficient documentation

## 2017-11-12 DIAGNOSIS — I110 Hypertensive heart disease with heart failure: Secondary | ICD-10-CM | POA: Diagnosis not present

## 2017-11-12 DIAGNOSIS — L89154 Pressure ulcer of sacral region, stage 4: Secondary | ICD-10-CM | POA: Insufficient documentation

## 2017-11-12 MED ORDER — ALTEPLASE 2 MG IJ SOLR
2.0000 mg | Freq: Once | INTRAMUSCULAR | Status: AC
  Administered 2017-11-12: 2 mg

## 2017-11-12 MED ORDER — ALTEPLASE 2 MG IJ SOLR
2.0000 mg | Freq: Once | INTRAMUSCULAR | Status: AC
  Administered 2017-11-12: 2 mg
  Filled 2017-11-12: qty 2

## 2017-11-12 MED ORDER — SODIUM CHLORIDE 0.9% FLUSH
10.0000 mL | Freq: Two times a day (BID) | INTRAVENOUS | Status: DC
  Administered 2017-11-12: 10 mL

## 2017-11-12 MED ORDER — SODIUM CHLORIDE 0.9% FLUSH
10.0000 mL | INTRAVENOUS | Status: DC | PRN
  Administered 2017-11-12: 10 mL

## 2017-11-12 NOTE — ED Triage Notes (Signed)
Pt sent for CATHFLO. [facility] RN sent letter with pt that she cannot pull labs from the pts PICC line. Pt is receiving abx for left hip pressure wound.

## 2017-11-12 NOTE — ED Notes (Signed)
Pt and family updated that we are waiting for IV team rn to come administer cathflo. Pt denies needs at this time. Will continue to monitor.

## 2017-11-12 NOTE — Progress Notes (Signed)
[facility]  Jessica [name] is an active pt on home IV ABX with [facility]. RN unable to obtain a blood return for weekly IV ABX labs. Since pt requires ambulance transport and [facility], the decision was made with [facility] and family for pt to come to ED for Cath flo.  [facility] while [facility] and [facility].   If patient discharges after hours, please call [phone].   [name] 11/12/2017, 3:08 PM

## 2017-11-12 NOTE — Progress Notes (Signed)
[facility] pharmacy lab review  

## 2017-11-12 NOTE — ED Provider Notes (Signed)
[facility] Emergency Department Provider Note   ____________________________________________   First MD Initiated Contact with Patient 11/12/17 1259     (approximate)  I have reviewed the triage vital signs and the nursing notes.   HISTORY  Chief Complaint Vascular Access Problem    HPI Jessica [name] is a 71 y.o. female here for evaluation for problems using the red port on her PICC line  Patient reports today and nurses were unable to draw off of the red port.  The port remains working well and they have been able to continue her infusions using it.  There is sent for evaluation to receive "Cathflo" per [facility] documentation  Denies any problems otherwise in the pick.  She is being treated for pressure sore with antibiotics, followed closely by wound clinic.  She is had a PICC line off and on for at least about a year now and last had this 1 placed about 2 weeks ago in [place]  No fevers or chills.  No redness or pain at the PEG site.  They have not noticed any problems with other than the one port is not able to have blood drawn off of it    Past Medical History:  Diagnosis Date  . Chronic pain   . Depression   . Diastolic heart failure (HCC)   . HLD (hyperlipidemia)   . HTN (hypertension)   . Hypothyroidism   . Morbid obesity (HCC)   . Recurrent UTI   . Spinal stenosis   . Visual hallucinations     Patient Active Problem List   Diagnosis Date Noted  . Weight loss 10/12/2017  . Needs peripherally inserted central catheter (PICC) 10/12/2017  . Subacute delirium 01/20/2017  . Decubitus ulcer of sacral region, stage 4 (HCC) 12/19/2016  . Candiduria, asymptomatic  12/18/2016  . Hallucinations 07/06/2016  . Sinus tachycardia 07/06/2016  . CONSTIPATION 07/08/2009  . DEEP VENOUS THROMBOPHLEBITIS, BILATERAL 01/07/2009  . Confined to bed 11/22/2008  . 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 HLD (hyperlipidemia) 12/10/2006    . ANXIETY 12/10/2006  . DEPRESSION 12/10/2006  . Essential hypertension 12/10/2006  . OSTEOARTHRITIS 12/10/2006  . Hypothyroidism 09/22/2006  . HYPERCHOLESTEROLEMIA 09/22/2006  . ECZEMA 09/22/2006    Past Surgical History:  Procedure Laterality Date  . ABDOMINAL HYSTERECTOMY    . BACK SURGERY    . HEMORROIDECTOMY    . IR RADIOLOGIST EVAL & MGMT  09/01/2017    Prior to Admission medications   Medication Sig Start Date End Date Taking? Authorizing Provider  acetaminophen (TYLENOL) 325 MG tablet Take 650 mg by mouth every 6 (six) hours as needed for mild pain.    [provider]  albuterol (PROVENTIL HFA;VENTOLIN HFA) 108 (90 Base) MCG/ACT inhaler Inhale 2 puffs into the lungs every 4 (four) hours as needed for wheezing or shortness of breath.    [provider]  albuterol (PROVENTIL) (2.5 MG/3ML) 0.083% nebulizer solution Take 2.5 mg by nebulization every 6 (six) hours as needed for wheezing or shortness of breath.    [provider]  amlodipine-atorvastatin (CADUET) 2.5-10 MG tablet amlodipine 2.5 mg-atorvastatin 10 mg tablet  Take 1 tablet every day by oral route.    [provider]  ARIPiprazole (ABILIFY) 2 MG tablet 1 BY MOUTH ONCE A DAY FOR MOOD AND HALLUCINATIONS 03/14/17   [provider]  aspirin EC 81 MG tablet Take 81 mg by mouth daily.    [provider]  baclofen (LIORESAL) 20 MG  tablet Take 5-20 mg by mouth 3 (three) times daily. 10 mg every morning and 20 mg every evening    [provider]  collagenase (SANTYL) ointment Apply topically daily. Patient not taking: Reported on 10/11/2017 02/11/17   Adrian Saran, MD  furosemide (LASIX) 20 MG tablet Take 20 mg by mouth daily.    [provider]  levothyroxine (SYNTHROID, LEVOTHROID) 150 MCG tablet Take 150 mcg by mouth daily before breakfast.    [provider]  liver oil-zinc oxide (DESITIN) 40 % ointment Apply 1 application topically as needed  (affected area). Patient not taking: Reported on 09/01/2017 02/11/17   Adrian Saran, MD  polyethylene glycol (MIRALAX / GLYCOLAX) packet Take 17 g by mouth daily.    [provider]  potassium chloride SA (K-DUR,KLOR-CON) 20 MEQ tablet Take 30 mEq by mouth daily.     [provider]  pregabalin (LYRICA) 150 MG capsule Take 150 mg by mouth 3 (three) times daily.    [provider]  Venlafaxine HCl 225 MG TB24 Take 225 mg by mouth daily.    [provider]  vitamin B-12 (CYANOCOBALAMIN) 1000 MCG tablet Take 1,000 mcg by mouth daily.    [provider]  Vitamin D, Ergocalciferol, (DRISDOL) 50000 units CAPS capsule Take 50,000 Units by mouth every 30 (thirty) days.    [provider]    Allergies Ambien [zolpidem tartrate] and Zinc  Family History  Problem Relation Age of Onset  . Heart disease Father   . Deep vein thrombosis Father     Social History Social History   Tobacco Use  . Smoking status: Never Smoker  . Smokeless tobacco: Former Neurosurgeon    Types: Snuff  Substance Use Topics  . Alcohol use: No  . Drug use: No    Review of Systems Constitutional: No fever/chills Cardiovascular: Denies chest pain. Respiratory: Denies shortness of breath. Gastrointestinal: No abdominal pain.  No nausea, no vomiting.  No diarrhea.  No constipation. Musculoskeletal: No pain in the left arm around the PEG site.  No redness or swelling.  No numbness or tingling the fingers or hands. Skin: Negative for rash. Neurological: Negative for headaches or weakness.    ____________________________________________   PHYSICAL EXAM:  VITAL SIGNS: ED Triage Vitals [11/12/17 1253]  Enc Vitals Group     BP (!) 144/74     Pulse Rate 74     Resp 16     Temp 97.9 F (36.6 C)     Temp Source Oral     SpO2 99 %     Weight 180 lb (81.6 kg)     Height      Head Circumference      Peak Flow      Pain Score      Pain Loc      Pain Edu?      Excl.  in GC?     Constitutional: Alert and oriented. Well appearing and in no acute distress. Eyes: Conjunctivae are normal. Head: Atraumatic. Nose: No congestion/rhinnorhea. Mouth/Throat: Mucous membranes are moist. Neck: No stridor.   Cardiovascular: Normal rate, regular rhythm. Grossly normal heart sounds.  Good peripheral circulation. Respiratory: Normal respiratory effort.  No retractions. Lungs CTAB. Gastrointestinal: Soft and nontender. No distention. Musculoskeletal:   LEFT Left upper extremity demonstrates normal strength, good use of all muscles. No edema bruising or contusions of the left shoulder/upper arm, left elbow, left forearm / hand. PICC site in the left upper extremity clean, dry, intact. PICC  does not appear damaged. Full range of motion of the left  upper extremity without pain. No evidence of trauma. Strong radial pulse. Intact median/ulnar/radial neuro-muscular exam.  Neurologic:  Normal speech and language. No gross focal neurologic deficits are appreciated.  Skin:  Skin is warm, dry and intact. No rash noted. Psychiatric: Mood and affect are normal. Speech and behavior are normal.  ____________________________________________   LABS (all labs ordered are listed, but only abnormal results are displayed)  Labs Reviewed - No data to display ____________________________________________  EKG   ____________________________________________  RADIOLOGY   ____________________________________________   PROCEDURES  Procedure(s) performed: None  Procedures  Critical Care performed: No  ____________________________________________   INITIAL IMPRESSION / ASSESSMENT AND PLAN / ED COURSE  Pertinent labs & imaging results that were available during my care of the patient were reviewed by me and considered in my medical decision making (see chart for details).  Patient asymptomatic, presents for difficulty with obtaining blood from her PEG site.  Clinical  Course as of Nov 12 1601  Fri Nov 12, 2017  1406 Case and x-ray reviewed with Dr. Lorretta HarpSchneir. Recommends "CathFlo" (2mg  TPA) protocol/procedure for line blockage.   [MQ]  1417 IV team coming to administer CathFlo   [MQ]  1534 IV access team at bedside.    [MQ]    Clinical Course User Index [MQ] Sharyn CreamerQuale, Irfan Veal, MD   Ongoing care assigned Dr. Roxan Hockeyobinson, follow-up on patient after evaluation by IV team for evaluation of the patient's PICC line and Cathflo administration.  ____________________________________________   FINAL CLINICAL IMPRESSION(S) / ED DIAGNOSES  Final diagnoses:  Occluded PICC line, initial encounter (HCC)      NEW MEDICATIONS STARTED DURING THIS VISIT:  New Prescriptions   No medications on file     Note:  This document was prepared using Dragon voice recognition software and may include unintentional dictation errors.     Sharyn CreamerQuale, Dorrie Cocuzza, MD 11/12/17 [phone]

## 2017-11-12 NOTE — ED Notes (Signed)
[facility] called to transport home 

## 2017-11-15 ENCOUNTER — Encounter: Payer: Self-pay | Admitting: Infectious Diseases

## 2017-11-17 ENCOUNTER — Encounter: Payer: Self-pay | Admitting: Family

## 2017-11-22 ENCOUNTER — Telehealth: Payer: Self-pay | Admitting: *Deleted

## 2017-11-22 NOTE — Progress Notes (Signed)
Jessica [name] ([id number]) Visit Report for 11/12/2017 Arrival Information Details [name], Jessica [name]. Date of Service: 11/12/2017 11:15 AM Medical Record Number: [id number] Patient Account Number: [id number] Date of Birth/Sex: 03-10-1946 (72 y.o. F) Treating RN: Huel Coventry Primary Care Jiayi Lengacher: Annita Brod Other Clinician: Referring Analia Zuk: Annita Brod Treating Nilda Keathley/Extender: Linwood Dibbles, HOYT Weeks in Treatment: 24 Visit Information History Since Last Visit Added or deleted any medications: No Patient Arrived: Stretcher Any new allergies or adverse reactions: No Arrival Time: 11:25 Had a fall or experienced change in No Accompanied By: son activities of daily living that may affect Transfer Assistance: Stretcher risk of falls: Patient Identification Verified: Yes Signs or symptoms of abuse/neglect since last visito No Secondary Verification Process Yes Hospitalized since last visit: No Completed: Implantable device outside of the clinic excluding No Patient Has Alerts: Yes cellular tissue based products placed in the center Patient Alerts: ALLERGIC TO since last visit: ZINC Has Dressing in Place as Prescribed: Yes Pain Present Now: No Electronic Signature(s) Signed: 11/12/2017 6:17:48 PM By: Elliot Gurney, BSN, RN, CWS, Kim RN, BSN Entered By: Elliot Gurney, BSN, RN, CWS, Kim on 11/12/2017 11:26:24 Jessica [name] ([id number]) -------------------------------------------------------------------------------- Clinic Level of Care Assessment Details [name]. Date of Service: 11/12/2017 11:15 AM Medical Record Number: [id number] Patient Account Number: [id number] Date of Birth/Sex: [birth year] (72 y.o. F) Treating RN: Curtis Sites Primary Care Daneshia Tavano: Annita Brod Other Clinician: Referring Ginia Rudell: Annita Brod Treating Elmo Rio/Extender: Linwood Dibbles, HOYT Weeks in Treatment: 24 Clinic Level of Care Assessment Items TOOL 4 Quantity  Score []  - Use when only an EandM is performed on FOLLOW-UP visit 0 ASSESSMENTS - Nursing Assessment / Reassessment X - Reassessment of Co-morbidities (includes updates in patient status) 1 10 X- 1 5 Reassessment of Adherence to Treatment Plan ASSESSMENTS - Wound and Skin Assessment / Reassessment []  - Simple Wound Assessment / Reassessment - one wound 0 X- 3 5 Complex Wound Assessment / Reassessment - multiple wounds []  - 0 Dermatologic / Skin Assessment (not related to wound area) ASSESSMENTS - Focused Assessment []  - Circumferential Edema Measurements - multi extremities 0 []  - 0 Nutritional Assessment / Counseling / Intervention X- 1 5 Lower Extremity Assessment (monofilament, tuning fork, pulses) []  - 0 Peripheral Arterial Disease Assessment (using hand held doppler) ASSESSMENTS - Ostomy and/or Continence Assessment and Care []  - Incontinence Assessment and Management 0 []  - 0 Ostomy Care Assessment and Management (repouching, etc.) PROCESS - Coordination of Care X - Simple Patient / Family Education for ongoing care 1 15 []  - 0 Complex (extensive) Patient / Family Education for ongoing care []  - 0 Staff obtains Chiropractor, Records, Test Results / Process Orders []  - 0 Staff telephones HHA, Nursing Homes / Clarify orders / etc []  - 0 Routine Transfer to another Facility (non-emergent condition) []  - 0 Routine Hospital Admission (non-emergent condition) []  - 0 New Admissions / Manufacturing engineer / Ordering NPWT, Apligraf, etc. []  - 0 Emergency Hospital Admission (emergent condition) X- 1 10 Simple Discharge Coordination Jessica [name], Jessica [name]. ([id number]) []  - 0 Complex (extensive) Discharge Coordination PROCESS - Special Needs []  - Pediatric / Minor Patient Management 0 []  - 0 Isolation Patient Management []  - 0 Hearing / Language / Visual special needs []  - 0 Assessment of Community assistance (transportation, D/C planning, etc.) []  - 0 Additional  assistance / Altered mentation []  - 0 Support Surface(s) Assessment (bed, cushion, seat, etc.) INTERVENTIONS - Wound Cleansing / Measurement []  - Simple Wound Cleansing - one wound  0 X- 3 5 Complex Wound Cleansing - multiple wounds X- 1 5 Wound Imaging (photographs - any number of wounds) []  - 0 Wound Tracing (instead of photographs) []  - 0 Simple Wound Measurement - one wound X- 3 5 Complex Wound Measurement - multiple wounds INTERVENTIONS - Wound Dressings X - Small Wound Dressing one or multiple wounds 2 10 X- 1 15 Medium Wound Dressing one or multiple wounds []  - 0 Large Wound Dressing one or multiple wounds []  - 0 Application of Medications - topical []  - 0 Application of Medications - injection INTERVENTIONS - Miscellaneous []  - External ear exam 0 []  - 0 Specimen Collection (cultures, biopsies, blood, body fluids, etc.) []  - 0 Specimen(s) / Culture(s) sent or taken to Lab for analysis []  - 0 Patient Transfer (multiple staff / Nurse, adult / Similar devices) []  - 0 Simple Staple / Suture removal (25 or less) []  - 0 Complex Staple / Suture removal (26 or more) []  - 0 Hypo / Hyperglycemic Management (close monitor of Blood Glucose) []  - 0 Ankle / Brachial Index (ABI) - do not check if billed separately X- 1 5 Vital Signs [name], [name] ([id number]) Has the patient been seen at the hospital within the last three years: Yes Total Score: 135 Level Of Care: New/Established - Level 4 Electronic Signature(s) Signed: 11/12/2017 5:18:28 PM By: Curtis Sites Entered By: Curtis Sites on 11/12/2017 12:21:04 Jessica [name] ([id number]) -------------------------------------------------------------------------------- Lower Extremity Assessment Details [name]. Date of Service: 11/12/2017 11:15 AM Medical Record Number: [id number] Patient Account Number: [id number] Date of Birth/Sex: [birth year] (72 y.o. F) Treating RN: Huel Coventry Primary Care  Arnesia Vincelette: Annita Brod Other Clinician: Referring Ronita Hargreaves: Annita Brod Treating Merlon Alcorta/Extender: Linwood Dibbles, HOYT Weeks in Treatment: 24 Vascular Assessment Pulses: Dorsalis Pedis Palpable: [Left:Yes] Posterior Tibial Extremity colors, hair growth, and conditions: Extremity Color: [Left:Normal] Hair Growth on Extremity: [Left:No] Temperature of Extremity: [Left:Warm] Capillary Refill: [Left:> 3 seconds] Toe Nail Assessment Left: Right: Thick: No Discolored: No Deformed: No Improper Length and Hygiene: No Electronic Signature(s) Signed: 11/12/2017 6:17:48 PM By: Elliot Gurney, BSN, RN, CWS, Kim RN, BSN Entered By: Elliot Gurney, BSN, RN, CWS, Kim on 11/12/2017 11:44:49 Jessica [name] ([id number]) -------------------------------------------------------------------------------- Multi Wound Chart Details [name]. Date of Service: 11/12/2017 11:15 AM Medical Record Number: [id number] Patient Account Number: [id number] Date of Birth/Sex: 10/11/1945 (72 y.o. F) Treating RN: Curtis Sites Primary Care Anea Fodera: Annita Brod Other Clinician: Referring Celisse Ciulla: Annita Brod Treating Meili Kleckley/Extender: STONE III, HOYT Weeks in Treatment: 24 Vital Signs Height(in): 70 Pulse(bpm): 72 Weight(lbs): Blood Pressure(mmHg): 154/60 Body Mass Index(BMI): Temperature(F): 98.4 Respiratory Rate 16 (breaths/min): Photos: Wound Location: Sacrum - Medial Left Lower Leg Left Gluteus Wounding Event: Pressure Injury Gradually Appeared Trauma Primary Etiology: Pressure Ulcer Pressure Ulcer Skin Tear Date Acquired: 01/15/2017 04/28/2017 11/12/2017 Weeks of Treatment: 24 24 0 Wound Status: Open Open Open Measurements L x W x D 7.9x6x2.8 0.5x0.4x0.1 0.5x0.5x0.1 (cm) Area (cm) : 37.228 0.157 0.196 Volume (cm) : 104.238 0.016 0.02 % Reduction in Area: -1.00% 66.70% N/A % Reduction in Volume: -182.90% 83.00% N/A Starting Position 1 11 (o'clock): Ending Position 1  1 (o'clock): Maximum Distance 1 (cm): 3.4 Undermining: Yes No No Classification: Category/Stage IV Category/Stage III Partial Thickness Exudate Amount: Large Large None Present Exudate Type: Sanguinous Purulent N/A Exudate Color: red yellow, brown, green N/A Wound Margin: Distinct, outline attached Distinct, outline attached Flat and Intact Granulation Amount: Medium (34-66%) None Present (0%) None Present (0%) Granulation Quality: Red, Pink,  Hyper-granulation N/A N/A Necrotic Amount: Medium (34-66%) Medium (34-66%) None Present (0%) Necrotic Tissue: Eschar, Adherent Slough Eschar, Adherent Slough N/A Exposed Structures: Fat Layer (Subcutaneous Fat Layer (Subcutaneous Fascia: No Tissue) Exposed: Yes Tissue) Exposed: Yes Fat Layer (Subcutaneous Jessica [name], Jessica [name]. ([id number]) Muscle: Yes Fascia: No Tissue) Exposed: No Bone: Yes Tendon: No Tendon: No Fascia: No Muscle: No Muscle: No Tendon: No Joint: No Joint: No Joint: No Bone: No Bone: No Limited to Skin Breakdown Epithelialization: None Small (1-33%) N/A Periwound Skin Texture: Induration: Yes Excoriation: No No Abnormalities Noted Scarring: Yes Induration: No Excoriation: No Callus: No Callus: No Crepitus: No Crepitus: No Rash: No Rash: No Scarring: No Periwound Skin Moisture: Maceration: No Maceration: No No Abnormalities Noted Dry/Scaly: No Dry/Scaly: No Periwound Skin Color: Erythema: Yes Erythema: Yes Rubor: Yes Rubor: Yes Atrophie Blanche: No Atrophie Blanche: No Cyanosis: No Cyanosis: No Ecchymosis: No Ecchymosis: No Hemosiderin Staining: No Hemosiderin Staining: No Mottled: No Mottled: No Pallor: No Pallor: No Rubor: No Erythema Location: Circumferential N/A N/A Temperature: No Abnormality No Abnormality N/A Tenderness on Palpation: Yes No No Wound Preparation: Ulcer Cleansing: Ulcer Cleansing: Ulcer Cleansing: Rinsed/Irrigated with Saline Rinsed/Irrigated with Saline  Rinsed/Irrigated with Saline Topical Anesthetic Applied: Topical Anesthetic Applied: Topical Anesthetic Applied: Other: lidocaine 4% Other: lidocaine 4% Other: lidocaine 4% Treatment Notes Electronic Signature(s) Signed: 11/12/2017 5:18:28 PM By: Curtis Sites Entered By: Curtis Sites on 11/12/2017 12:17:46 Jessica [name] ([id number]) -------------------------------------------------------------------------------- Multi-Disciplinary Care Plan Details [name]. Date of Service: 11/12/2017 11:15 AM Medical Record Number: [id number] Patient Account Number: [id number] Date of Birth/Sex: 1945/11/01 (72 y.o. F) Treating RN: Curtis Sites Primary Care Siddarth Hsiung: Annita Brod Other Clinician: Referring Pepper Wyndham: Annita Brod Treating Zackery Brine/Extender: STONE III, HOYT Weeks in Treatment: 24 Active Inactive ` Orientation to the Wound Care Program Nursing Diagnoses: Knowledge deficit related to the wound healing center program Goals: Patient/caregiver will verbalize understanding of the Wound Healing Center Program Date Initiated: 06/03/2017 Target Resolution Date: 06/17/2017 Goal Status: Active Interventions: Provide education on orientation to the wound center Notes: ` Pressure Nursing Diagnoses: Knowledge deficit related to causes and risk factors for pressure ulcer development Knowledge deficit related to management of pressures ulcers Potential for impaired tissue integrity related to pressure, friction, moisture, and shear Goals: Patient will remain free from development of additional pressure ulcers Date Initiated: 06/03/2017 Target Resolution Date: 06/17/2017 Goal Status: Active Patient will remain free of pressure ulcers Date Initiated: 06/03/2017 Target Resolution Date: 06/17/2017 Goal Status: Active Patient/caregiver will verbalize risk factors for pressure ulcer development Date Initiated: 06/03/2017 Target Resolution Date: 06/17/2017 Goal  Status: Active Interventions: Assess: immobility, friction, shearing, incontinence upon admission and as needed Assess potential for pressure ulcer upon admission and as needed Notes: ` Wound/Skin Impairment Jessica [name], Jessica [name] ([id number]) Nursing Diagnoses: Impaired tissue integrity Goals: Patient/caregiver will verbalize understanding of skin care regimen Date Initiated: 06/03/2017 Target Resolution Date: 06/17/2017 Goal Status: Active Ulcer/skin breakdown will have a volume reduction of 30% by week 4 Date Initiated: 06/03/2017 Target Resolution Date: 06/17/2017 Goal Status: Active Interventions: Assess ulceration(s) every visit Treatment Activities: Patient referred to home care : 06/03/2017 Skin care regimen initiated : 06/03/2017 Notes: Electronic Signature(s) Signed: 11/12/2017 5:18:28 PM By: Curtis Sites Entered By: Curtis Sites on 11/12/2017 12:17:29 Jessica [name] ([id number]) -------------------------------------------------------------------------------- Pain Assessment Details [name]. Date of Service: 11/12/2017 11:15 AM Medical Record Number: [id number] Patient Account Number: [id number] Date of Birth/Sex: Aug 14, 1945 (72 y.o. F) Treating RN: Huel Coventry Primary Care Shaine Mount: Annita Brod Other  Clinician: Referring Tage Feggins: Annita BrodASENSO, Jessica [name] Treating Aracelly Tencza/Extender: STONE III, HOYT Weeks in Treatment: 24 Active Problems Location of Pain Severity and Description of Pain Patient Has Paino No Site Locations Pain Management and Medication Current Pain Management: Electronic Signature(s) Signed: 11/12/2017 6:17:48 PM By: Elliot GurneyWoody, BSN, RN, CWS, Kim RN, BSN Entered By: Elliot GurneyWoody, BSN, RN, CWS, Kim on 11/12/2017 11:26:37 Jessica BradyVERMAN, [name] ([id number]) -------------------------------------------------------------------------------- Wound Assessment Details Patient Name: Jessica BradyVERMAN, [name] Date of Service: 11/12/2017 11:15 AM Medical Record  Number: [id number] Patient Account Number: [id number] Date of Birth/Sex: 22-Mar-1946 (72 y.o. F) Treating RN: Huel CoventryWoody, Kim Primary Care Shadawn Hanaway: Annita BrodASENSO, Jessica [name] Other Clinician: Referring Nomi Rudnicki: Annita BrodASENSO, Jessica [name] Treating Kae Lauman/Extender: STONE III, HOYT Weeks in Treatment: 24 Wound Status Wound Number: 1 Primary Etiology: Pressure Ulcer Wound Location: Sacrum - Medial Wound Status: Open Wounding Event: Pressure Injury Date Acquired: 01/15/2017 Weeks Of Treatment: 24 Clustered Wound: No Photos Photo Uploaded By: Elliot GurneyWoody, BSN, RN, CWS, Kim on 11/12/2017 11:56:58 Wound Measurements Length: (cm) 7.9 % Reducti Width: (cm) 6 % Reducti Depth: (cm) 2.8 Epithelia Area: (cm) 37.228 Undermin Volume: (cm) 104.238 Start Ending Maximu on in Area: -1% on in Volume: -182.9% lization: None ing: Yes ing Position (o'clock): 11 Position (o'clock): 1 m Distance: (cm) 3.4 Wound Description Classification: Category/Stage IV Foul Odor Wound Margin: Distinct, outline attached Slough/Fi Exudate Amount: Large Exudate Type: Sanguinous Exudate Color: red After Cleansing: No brino Yes Wound Bed Granulation Amount: Medium (34-66%) Exposed Structure Granulation Quality: Red, Pink, Hyper-granulation Fascia Exposed: No Necrotic Amount: Medium (34-66%) Fat Layer (Subcutaneous Tissue) Exposed: Yes Necrotic Quality: Eschar, Adherent Slough Tendon Exposed: No Muscle Exposed: Yes Necrosis of Muscle: No Jessica BradyOVERMAN, Selma W. ([id number]) Joint Exposed: No Bone Exposed: Yes Periwound Skin Texture Texture Color No Abnormalities Noted: No No Abnormalities Noted: No Callus: No Atrophie Blanche: No Crepitus: No Cyanosis: No Excoriation: No Ecchymosis: No Induration: Yes Erythema: Yes Rash: No Erythema Location: Circumferential Scarring: Yes Hemosiderin Staining: No Mottled: No Moisture Pallor: No No Abnormalities Noted: No Rubor: Yes Dry / Scaly: No Maceration: No Temperature /  Pain Temperature: No Abnormality Tenderness on Palpation: Yes Wound Preparation Ulcer Cleansing: Rinsed/Irrigated with Saline Topical Anesthetic Applied: Other: lidocaine 4%, Electronic Signature(s) Signed: 11/12/2017 6:17:48 PM By: Elliot GurneyWoody, BSN, RN, CWS, Kim RN, BSN Entered By: Elliot GurneyWoody, BSN, RN, CWS, Kim on 11/12/2017 11:40:38 Jessica BradyVERMAN, [name] ([id number]) -------------------------------------------------------------------------------- Wound Assessment Details Patient Name: Jessica BradyVERMAN, [name] Date of Service: 11/12/2017 11:15 AM Medical Record Number: [id number] Patient Account Number: [id number] Date of Birth/Sex: 22-Mar-1946 (72 y.o. F) Treating RN: Huel CoventryWoody, Kim Primary Care Mariha Sleeper: Annita BrodASENSO, Jessica [name] Other Clinician: Referring Jamen Loiseau: Annita BrodASENSO, Jessica [name] Treating Joahan Swatzell/Extender: STONE III, HOYT Weeks in Treatment: 24 Wound Status Wound Number: 2 Primary Etiology: Pressure Ulcer Wound Location: Left Lower Leg Wound Status: Open Wounding Event: Gradually Appeared Date Acquired: 04/28/2017 Weeks Of Treatment: 24 Clustered Wound: No Photos Photo Uploaded By: Elliot GurneyWoody, BSN, RN, CWS, Kim on 11/12/2017 11:57:38 Wound Measurements Length: (cm) 0.5 Width: (cm) 0.4 Depth: (cm) 0.1 Area: (cm) 0.157 Volume: (cm) 0.016 % Reduction in Area: 66.7% % Reduction in Volume: 83% Epithelialization: Small (1-33%) Tunneling: No Undermining: No Wound Description Classification: Category/Stage III Foul Odor Wound Margin: Distinct, outline attached Slough/Fi Exudate Amount: Large Exudate Type: Purulent Exudate Color: yellow, brown, green After Cleansing: No brino Yes Wound Bed Granulation Amount: None Present (0%) Exposed Structure Necrotic Amount: Medium (34-66%) Fascia Exposed: No Necrotic Quality: Eschar, Adherent Slough Fat Layer (Subcutaneous Tissue) Exposed: Yes Tendon Exposed: No Muscle Exposed: No Joint Exposed: No Bone Exposed: No Periwound Skin  Texture Jessica BradyOVERMAN, Shareece W.  ([id number]) Texture Color No Abnormalities Noted: No No Abnormalities Noted: No Callus: No Atrophie Blanche: No Crepitus: No Cyanosis: No Excoriation: No Ecchymosis: No Induration: No Erythema: Yes Rash: No Hemosiderin Staining: No Scarring: No Mottled: No Pallor: No Moisture Rubor: No No Abnormalities Noted: No Dry / Scaly: No Temperature / Pain Maceration: No Temperature: No Abnormality Wound Preparation Ulcer Cleansing: Rinsed/Irrigated with Saline Topical Anesthetic Applied: Other: lidocaine 4%, Electronic Signature(s) Signed: 11/12/2017 6:17:48 PM By: Elliot GurneyWoody, BSN, RN, CWS, Kim RN, BSN Entered By: Elliot GurneyWoody, BSN, RN, CWS, Kim on 11/12/2017 11:36:01 Jessica BradyVERMAN, Neshia W. ([id number]) -------------------------------------------------------------------------------- Wound Assessment Details Patient Name: Jessica BradyVERMAN, [name] Date of Service: 11/12/2017 11:15 AM Medical Record Number: [id number] Patient Account Number: [id number] Date of Birth/Sex: 01-25-1946 (72 y.o. F) Treating RN: Huel CoventryWoody, Kim Primary Care Chrishelle Zito: Annita BrodASENSO, Jessica [name] Other Clinician: Referring Dietrich Ke: Annita BrodASENSO, Jessica [name] Treating Lucette Kratz/Extender: STONE III, HOYT Weeks in Treatment: 24 Wound Status Wound Number: 5 Primary Etiology: Skin Tear Wound Location: Left Gluteus Wound Status: Open Wounding Event: Trauma Date Acquired: 11/12/2017 Weeks Of Treatment: 0 Clustered Wound: No Photos Photo Uploaded By: Elliot GurneyWoody, BSN, RN, CWS, Kim on 11/12/2017 11:57:40 Wound Measurements Length: (cm) 0.5 Width: (cm) 0.5 Depth: (cm) 0.1 Area: (cm) 0.196 Volume: (cm) 0.02 % Reduction in Area: % Reduction in Volume: Tunneling: No Undermining: No Wound Description Classification: Partial Thickness Wound Margin: Flat and Intact Exudate Amount: None Present Foul Odor After Cleansing: No Slough/Fibrino No Wound Bed Granulation Amount: None Present (0%) Exposed Structure Necrotic Amount: None Present (0%) Fascia Exposed:  No Fat Layer (Subcutaneous Tissue) Exposed: No Tendon Exposed: No Muscle Exposed: No Joint Exposed: No Bone Exposed: No Limited to Skin Breakdown Periwound Skin Texture Texture Color Jessica BradyOVERMAN, Bindi W. ([id number]) No Abnormalities Noted: No No Abnormalities Noted: No Rubor: Yes Moisture No Abnormalities Noted: No Wound Preparation Ulcer Cleansing: Rinsed/Irrigated with Saline Topical Anesthetic Applied: Other: lidocaine 4%, Electronic Signature(s) Signed: 11/12/2017 6:17:48 PM By: Elliot GurneyWoody, BSN, RN, CWS, Kim RN, BSN Entered By: Elliot GurneyWoody, BSN, RN, CWS, Kim on 11/12/2017 11:43:22 Jessica BradyVERMAN, Tressia W. ([id number]) -------------------------------------------------------------------------------- Vitals Details Patient Name: Jessica BradyVERMAN, Lus W. Date of Service: 11/12/2017 11:15 AM Medical Record Number: [id number] Patient Account Number: [id number] Date of Birth/Sex: 01-25-1946 (72 y.o. F) Treating RN: Huel CoventryWoody, Kim Primary Care Tara Wich: Annita BrodASENSO, Jessica [name] Other Clinician: Referring Matthew Pais: Annita BrodASENSO, Jessica [name] Treating Cerenity Goshorn/Extender: STONE III, HOYT Weeks in Treatment: 24 Vital Signs Time Taken: 11:26 Temperature (F): 98.4 Height (in): 70 Pulse (bpm): 72 Respiratory Rate (breaths/min): 16 Blood Pressure (mmHg): 154/60 Reference Range: 80 - 120 mg / dl Electronic Signature(s) Signed: 11/12/2017 6:17:48 PM By: Elliot GurneyWoody, BSN, RN, CWS, Kim RN, BSN Entered By: Elliot GurneyWoody, BSN, RN, CWS, Kim on 11/12/2017 11:27:03

## 2017-11-22 NOTE — Telephone Encounter (Signed)
I have her scheduled 8/22 at 11:15 with Tammy SoursGreg. Her son is SO grateful!

## 2017-11-22 NOTE — Progress Notes (Signed)
[name], [name] ([id number]) Visit Report for 11/12/2017 Chief Complaint Document Details [name]. Date of Service: 11/12/2017 11:15 AM Medical Record Number: [id number] Patient Account Number: [id number] Date of Birth/Sex: 1946-01-03 (72 y.o. F) Treating RN: Curtis Sites Primary Care Provider: Annita Brod Other Clinician: Referring Provider: Annita Brod Treating Provider/Extender: Linwood Dibbles, HOYT Weeks in Treatment: 24 Information Obtained from: Patient Chief Complaint she is here for evaluation of a sacral ulcer and bilateral lower extremity ulcers Electronic Signature(s) Signed: 11/15/2017 1:43:31 AM By: Lenda Kelp PA-C Entered By: Lenda Kelp on 11/12/2017 10:59:04 Jessica [name] ([id number]) -------------------------------------------------------------------------------- HPI Details [name]. Date of Service: 11/12/2017 11:15 AM Medical Record Number: [id number] Patient Account Number: [id number] Date of Birth/Sex: Mar 26, 1946 (72 y.o. F) Treating RN: Curtis Sites Primary Care Provider: Annita Brod Other Clinician: Referring Provider: Annita Brod Treating Provider/Extender: Linwood Dibbles, HOYT Weeks in Treatment: 24 History of Present Illness HPI Description: 05/27/17-she is here in initial evaluation for a left-sided sacral stage IV pressure ulcer and bilateral lower extremity, lateral aspect, unstageable pressure ulcers. She is accompanied by her husband and her son, who are her primary caregivers. She is bedbound secondary to spinal stenosis. According to her son and husband she was hospitalized from 10/5-11/1 with healthcare associated pneumonia and altered mental status. During her hospitalization she was intubated, extubated on 10/27. She was discharged with Foley catheter and follow up with urology. According to her son and spouse she developed the sacral ulcer during hospitalization. Home health has been  applying Santyl daily. She does have a low air loss mattress and is repositioned every 2-3 hours per her family's report. According to the son and spouse she had an appointment with urology on 12/18 and during that appointment developed discoloration to her bilateral lower extremities which ultimately developed into unstageable pressure ulcers to the lateral aspects of her bilateral lower extremities. There has been no topical treatment applied to these. She continues to have home health. There is no concerns expressed regarding dietary intake, stating she eats 3 meals a day, eating was provided; she is supplemented with boost with protein. 06/03/17-she is here in follow-up evaluation for sacral and bilateral lower extremity ulcers. Plain film x-ray done today reveals no distraction to the sacrum or coccyx, no visible abnormalities. Home health has ordered the negative pressure wound system but it has not been initiated. We will continue with Hydrofera Blue until initiation of negative pressure wound system and continue with Santyl to bilateral lower extremity ulcers. Follow-up next week 06/10/17-she is here in follow-up evaluation for sacral and bilateral lower extremity ulcers. The wound VAC will be available tomorrow per home health. We will initiate wound VAC therapy to the sacral ulcer 3 times weekly (Thursday, Saturday/Sunday, Tuesday). We will continue with Santyl to the lower extremity ulcers. The patient's son is checking into home health therapy over the weekend for [facility] changes, with the understanding that if VAC changes cannot be performed over the weekend he will need to change his mother's appointments to Monday, Wednesday or Friday. The sacral ulcer clinically does not appear infected but there has been a change in the amount of drainage acutely, there is no significant amount of devitalized tissue, there is no malodor. Wound culture was obtained to evaluate for occult infection we will  hold off on antibiotic therapy until sensitivities are resulted. 06/18/17 on evaluation today patient appears to be doing fairly well in my opinion although this is the first time I  have seen this patient she has been previously evaluated by Tacey RuizLeah here in our office. She is going to be switching to Fridays to see me due to the Wound VAC schedule being changed on Monday, Wednesday, and Friday. Subsequently she seems to be doing fairly well with the Wound VAC. Her son who was present during evaluation today states that he somewhat stresses of the Wound VAC in making sure that it was functioning appropriately. With that being said everything seems to be working well he knows what settings on the [facility] HVAC itself to look at and ensure that it is functioning properly. Overall the wound appears to be nice and clean there is no need for debridement today. She has no discomfort in her bilateral lower extremity ulcers also appear to be improving based on measurements and what this honest tell me about the overall appearance. 07/02/17 on evaluation today I noted in patients wound bed that was actually an odor that had not previously been noted. Subsequently there was also a small area of bone that was not previously noted during my last evaluation. This did appear to be necrotic and was being somewhat forced out by the body around the region of granulation. She does not have any pain which is good news. Nonetheless the overall appearance of the ulcer is making me concerned for the patient having developed osteomyelitis. Currently she is not been on any antibiotics and the Wound VAC has been doing fairly well in general. With that being said I do not think we need to continue the Wound VAC if she potentially has a bone infection. At least not until it is properly addressed with antibiotics. 07/09/17 on evaluation today patient appears to actually be doing very well in regard to her sacral ulcer compared to last  week. There is actually no exposed bone at this point. 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 With that being said still I do believe she needs to have a referral to infectious disease due to the osteomyelitis I am gonna recommend that she continue with the doxycycline based on the culture results which showed Eikenella Corrodens as the Jessica BradyOVERMAN, Rayvin W. ([id number]) organism identified that tetracycline should work for. She does not seem to be having any discomfort whatsoever at this point. 07/16/17 on evaluation today patient actually appears to be doing rather well in regard to her sacral wound. I think that the original wound site actually appears much better than previously noted. With that being said she does have a new superficial injury on the right sacral region which appears to be due to according to the sign transport that occurred for her MRI unfortunately. Fortunately this is not too deep and I do think it can be managed but it was a new area that was not previously noted. Otherwise she has been tolerating the Dakinos soaked gauze packing without complication. 07/30/17 on evaluation today patient presents for reevaluation concerning her sacral wound. She has been tolerating the dressing changes without complication. With that being said her wound is doing so well at this point that I think she may be at the point where we could reinitiate the Wound VAC currently and hopefully see good results from this. I'm very pleased with how she has responded to the Dakinos soaked gauze packing. 08/13/17 evaluation today patient appears to be doing excellent in  regard to her lower extremity ulcer this seems to be cleaning up very nicely. In regard to the sacral ulcer the area of trauma on the right lateral portion of the wound actually appears  to be much better than previously noted during the last evaluation. The word has definitely filled in and the Wound VAC appears to be helpful I do believe. Overall I'm very pleased with how things seem to be progressing at this time. Patient likewise is also happy that things are doing well. 08/27/17 on evaluation today patient presents for follow-up concerning her ongoing issues with her sacral ulcer and lower extremity ulcer. Fortunately she has been tolerating the dressing changes well complication the Wound VAC in general seems to have done very well up to this point. She does not have any evidence of infection which is good news. She does have some dark discoloration in central portion of the wound which is troublesome for the possibility of pressure injury to the site it sounds like her prior air mattress was not functioning properly her son has just bought a new one for her this is doing much better. Otherwise things seem to be progressing nicely. 09/10/17 on evaluation today patient presents for follow-up concerning her sacral wound and lower extremity ulcer. She has been tolerating the switch of her dressing changes to the Dakinos soaked gauze dressing very well in regard to the sacrum. The left lateral lower extremity ulcer appears to have a new area open just distal to the one that we have been treating with Santyl and this is new since her last visit. There does not appear to be any evidence of infection which is good news. With that being said there's a lot of maceration here at the site and I feel this is likely due to the switch and the dressings it appears that due to the sticking of the dressings it will switch to utilizing a telfa pad over the Santyl. I think this caused a lot of drainage to be collected and situated in the region just under the dressing which has led to this causing which duration of breakdown. Overall there does not appear to be any significant pain which is good  news. Of note I did actually have a conversation with the radiologist who is an interventional radiologist with [facility]. 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 With that being said there does not appear to be any rush to get this done according to the interventional radiologist whom I spoke with. Therefore after discussion it was determined that there gonna wait about six months prior to considering the procedure to get time for hopefully the sacral wound to heal in the interim to at least some degree. 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 According to nursing staff these are all improved, the negative pressure wound therapy system was put on hold last week. We will resume negative pressure wound therapy today, continue with Santyl to the lower extremity and she will follow-up next week. 09/24/17 on evaluation today unfortunately the patient's wound appears to be doing significantly worse compared to last week's evaluation and even my valuation the week before. She actually has bone noted on the left wound margin in the region of undermining unfortunately. This was not noted during the last evaluation. I am concerned about the fact that this seems to be worse not better since  our last visit with her. My biggest concern is that she's likely developing a worsening osteomyelitis. This was discussed with patient and her son today during the office visit. 05/03/17 on evaluation today patient actually appears to be doing rather well in regard to her sacral ulcer compared to last week's evaluation. She has been tolerating the dressing changes without complication. Fortunately even though we're not utilizing the Wound VAC she seems to be making some strides in seeing the wound area overall improved quite dramatically in my pinion in just one weeks time. She also seems to  be staying off of this to the point that I do not see any evidence of new injury which is excellent news. Whatever she's been doing in that regard over the past week I would want her to continue. I did also review the results of her bone culture which revealed group B strep as the responsible organism. Again this is what's causing her osteomyelitis she does have infectious disease appointment scheduled for 10/11/17 10/08/1898 valuation today patient appears to be doing better for the most part in regard to the sacral wound. Overall she is showing signs of having granulation of the bone which is good news. There is an area towards the left of the wound where she does seem to be showing some signs of opening this it would be due to additional pressure to the area unfortunately. LAUREL, HARNDEN. ([id number]) There does not appear to be any evidence of infection spreading which is good news that do not see any evidence of significant purulent discharge which is also good news. 10/26/17 on evaluation today patient sacral ulcer actually appears to be very healthy and doing well in regard to the overall appearance of the wound. With that being said she does seem to be having some issues at this point in time with some shear/friction injury of the sacrum from where she was transported to [facility] for her infectious disease appointment. She has been placed on IV antibiotic therapy I will have to go back and find that note for review as I did not have access to that today. Nonetheless I will add the next visit be sure to document the exact antibiotics that she is taking at this point. Nonetheless I do believe the wound is doing better there's no bone exposure nothing that requires debridement in regard to the sacral wound. Likewise her left lateral lower extremity ulcer also appears to be doing significantly better. In general I feel like things are showing signs of improvement all around. 11/12/17 on  evaluation today patient appears to be doing rather well in regard to her sacral wound. In general I do see signs of granulation at this point. Fortunately there does not appear to be any evidence of infection which is good news as well. In general overall happy with the appearance. With that being said I'll be see I would like for this to be progressing more rapidly as with the patient and her son but nonetheless at least things do not appear to be doing worse. Her lower extremity ulcer is doing significantly better. Electronic Signature(s) Signed: 11/15/2017 1:43:31 AM By: Lenda Kelp PA-C Entered By: Lenda Kelp on 11/12/2017 13:38:42 Jessica [name] ([id number]) -------------------------------------------------------------------------------- Physical Exam Details [name]. Date of Service: 11/12/2017 11:15 AM Medical Record Number: [id number] Patient Account Number: [id number] Date of Birth/Sex: 05-06-1945 (72 y.o. F) Treating RN: Curtis Sites Primary Care Provider: Annita Brod Other Clinician: Referring Provider: Annita Brod  Treating Provider/Extender: STONE III, HOYT Weeks in Treatment: 24 Constitutional Well-nourished and well-hydrated in no acute distress. Respiratory normal breathing without difficulty. clear to auscultation bilaterally. Cardiovascular regular rate and rhythm with normal S1, S2. Psychiatric this patient is able to make decisions and demonstrates good insight into disease process. Alert and Oriented x 3. pleasant and cooperative. Notes Patient's wound currently did not require any sharp debridement today which was good news. I feel like that she's making good progress in my suggestion at this point would be for [place] to continue with the Current wound care measures since she seems to be doing so well. Electronic Signature(s) Signed: 11/15/2017 1:43:31 AM By: Lenda Kelp PA-C Entered By: Lenda Kelp on 11/12/2017  13:39:33 Jessica [name] ([id number]) -------------------------------------------------------------------------------- Physician Orders Details [name]. Date of Service: 11/12/2017 11:15 AM Medical Record Number: [id number] Patient Account Number: [id number] Date of Birth/Sex: 09-28-1945 (72 y.o. F) Treating RN: Curtis Sites Primary Care Provider: Annita Brod Other Clinician: Referring Provider: Annita Brod Treating Provider/Extender: STONE III, HOYT Weeks in Treatment: 24 Verbal / Phone Orders: No Diagnosis Coding ICD-10 Coding Code Description L89.154 Pressure ulcer of sacral region, stage 4 M48.00 Spinal stenosis, site unspecified R54 Age-related physical debility G89.4 Chronic pain syndrome E78.2 Mixed hyperlipidemia L89.93 Pressure ulcer of unspecified site, stage 3 Wound Cleansing Wound #1 Medial Sacrum o Other: - please cleanse sacral wound with dakins moistened gauze - do not spray dakins on wound Wound #2 Left Lower Leg o Clean wound with Normal Saline. Wound #5 Left Gluteus o Clean wound with Normal Saline. Anesthetic (add to Medication List) Wound #1 Medial Sacrum o Topical Lidocaine 4% cream applied to wound bed prior to debridement (In Clinic Only). Wound #2 Left Lower Leg o Topical Lidocaine 4% cream applied to wound bed prior to debridement (In Clinic Only). Wound #5 Left Gluteus o Topical Lidocaine 4% cream applied to wound bed prior to debridement (In Clinic Only). Skin Barriers/Peri-Wound Care Wound #1 Medial Sacrum o Skin Prep Wound #2 Left Lower Leg o Antifungal cream Primary Wound Dressing Wound #1 Medial Sacrum o Alginate - on superficial area to the right side of the wound use until wound vac is placed back on wound o Pack wound with: - Dakin's soaked gauze use until wound vac is placed back on wound by [facility], Jacy Lacretia Nicks ([id number]) Wound #2 Left Lower Leg o Collagen - plain  collagen NO SILVER Wound #5 Left Gluteus o Silver Alginate Secondary Dressing Wound #1 Medial Sacrum o ABD pad - secure with tape use until wound vac is placed back on wound o XtraSorb - use until wound vac is placed back on wound Wound #2 Left Lower Leg o Boardered Foam Dressing - or ABD pad and tape Wound #5 Left Gluteus o Boardered Foam Dressing - or ABD pad and tape Dressing Change Frequency Wound #1 Medial Sacrum o Change Dressing Monday, Wednesday, Friday Wound #5 Left Gluteus o Change Dressing Monday, Wednesday, Friday Wound #2 Left Lower Leg o Change dressing every day. Follow-up Appointments Wound #1 Medial Sacrum o Return Appointment in 2 weeks. Wound #2 Left Lower Leg o Return Appointment in 2 weeks. Wound #5 Left Gluteus o Return Appointment in 2 weeks. Off-Loading Wound #1 Medial Sacrum o Turn and reposition every 2 hours Wound #2 Left Lower Leg o Turn and reposition every 2 hours Additional Orders / Instructions Wound #1 Medial Sacrum o Increase protein intake. Wound #2 Left Lower Leg o Increase protein intake.  Home Health CALA, KRUCKENBERG ([id number]) Wound #1 Medial Sacrum o Continue Home Health Visits - Advanced HHRN to start wound vac o Home Health Nurse may visit PRN to address patientos wound care needs. o FACE TO FACE ENCOUNTER: MEDICARE and MEDICAID PATIENTS: I certify that this patient is under my care and that I had a face-to-face encounter that meets the physician face-to-face encounter requirements with this patient on this date. The encounter with the patient was in whole or in part for the following MEDICAL CONDITION: (primary reason for Home Healthcare) MEDICAL NECESSITY: I certify, that based on my findings, NURSING services are a medically necessary home health service. HOME BOUND STATUS: I certify that my clinical findings support that this patient is homebound (i.e., Due to illness or injury, pt  requires aid of supportive devices such as crutches, cane, wheelchairs, walkers, the use of special transportation or the assistance of another person to leave their place of residence. There is a normal inability to leave the home and doing so requires considerable and taxing effort. Other absences are for medical reasons / religious services and are infrequent or of short duration when for other reasons). o If current dressing causes regression in wound condition, may D/C ordered dressing product/s and apply Normal Saline Moist Dressing daily until next Wound Healing Center / Other MD appointment. Notify Wound Healing Center of regression in wound condition at [phone]. o Please direct any NON-WOUND related issues/requests for orders to patient's Primary Care Physician Wound #2 Left Lower Leg o Continue Home Health Visits - Advanced HHRN to start wound vac o Home Health Nurse may visit PRN to address patientos wound care needs. o FACE TO FACE ENCOUNTER: MEDICARE and MEDICAID PATIENTS: I certify that this patient is under my care and that I had a face-to-face encounter that meets the physician face-to-face encounter requirements with this patient on this date. The encounter with the patient was in whole or in part for the following MEDICAL CONDITION: (primary reason for Home Healthcare) MEDICAL NECESSITY: I certify, that based on my findings, NURSING services are a medically necessary home health service. HOME BOUND STATUS: I certify that my clinical findings support that this patient is homebound (i.e., Due to illness or injury, pt requires aid of supportive devices such as crutches, cane, wheelchairs, walkers, the use of special transportation or the assistance of another person to leave their place of residence. There is a normal inability to leave the home and doing so requires considerable and taxing effort. Other absences are for medical reasons / religious services and are  infrequent or of short duration when for other reasons). o If current dressing causes regression in wound condition, may D/C ordered dressing product/s and apply Normal Saline Moist Dressing daily until next Wound Healing Center / Other MD appointment. Notify Wound Healing Center of regression in wound condition at [phone]. o Please direct any NON-WOUND related issues/requests for orders to patient's Primary Care Physician Wound #5 Left Gluteus o Continue Home Health Visits - Advanced HHRN to start wound vac o Home Health Nurse may visit PRN to address patientos wound care needs. o FACE TO FACE ENCOUNTER: MEDICARE and MEDICAID PATIENTS: I certify that this patient is under my care and that I had a face-to-face encounter that meets the physician face-to-face encounter requirements with this patient on this date. The encounter with the patient was in whole or in part for the following MEDICAL CONDITION: (primary reason for Home Healthcare) MEDICAL NECESSITY: I certify, that based on  my findings, NURSING services are a medically necessary home health service. HOME BOUND STATUS: I certify that my clinical findings support that this patient is homebound (i.e., Due to illness or injury, pt requires aid of supportive devices such as crutches, cane, wheelchairs, walkers, the use of special transportation or the assistance of another person to leave their place of residence. There is a normal inability to leave the home and doing so requires considerable and taxing effort. Other absences are for medical reasons / religious services and are infrequent or of short duration when for other reasons). o If current dressing causes regression in wound condition, may D/C ordered dressing product/s and apply Normal Saline Moist Dressing daily until next Wound Healing Center / Other MD appointment. Notify Wound Healing Center of regression in wound condition at [phone]. o Please direct any  NON-WOUND related issues/requests for orders to patient's Primary Care Physician Negative Pressure Wound Therapy Jessica BradyOVERMAN, Mariposa W. ([id number]) Wound #1 Medial Sacrum o Wound VAC settings at 125/130 mmHg continuous pressure. Use BLACK/GREEN foam to wound cavity. Use WHITE foam to fill any tunnel/s and/or undermining. Change VAC dressing 3 X WEEK. Change canister as indicated when full. Nurse may titrate settings and frequency of dressing changes as clinically indicated. - HHRN to start wound vac HHRN to change on Mondays, Wednesdays and every other Friday o Home Health Nurse may d/c VAC for s/s of increased infection, significant wound regression, or uncontrolled drainage. Notify Wound Healing Center at [phone]. o Number of foam/gauze pieces used in the dressing = Electronic Signature(s) Signed: 11/12/2017 5:18:28 PM By: Curtis Sitesorthy, Joanna Signed: 11/15/2017 1:43:31 AM By: Lenda KelpStone III, Hoyt PA-C Entered By: Curtis Sitesorthy, Joanna on 11/12/2017 12:20:00 Jessica BradyVERMAN, Ambriella W. ([id number]) -------------------------------------------------------------------------------- Problem List Details Patient Name: Jessica BradyVERMAN, Melanny W. Date of Service: 11/12/2017 11:15 AM Medical Record Number: [id number] Patient Account Number: [id number] Date of Birth/Sex: 1945-10-27 (72 y.o. F) Treating RN: Curtis Sitesorthy, Joanna Primary Care Provider: Annita BrodASENSO, PHILIP Other Clinician: Referring Provider: Annita BrodASENSO, PHILIP Treating Provider/Extender: Linwood DibblesSTONE III, HOYT Weeks in Treatment: 24 Active Problems ICD-10 Evaluated Encounter Code Description Active Date Today Diagnosis L89.154 Pressure ulcer of sacral region, stage 4 05/27/2017 No Yes M48.00 Spinal stenosis, site unspecified 05/27/2017 No Yes R54 Age-related physical debility 05/27/2017 No Yes G89.4 Chronic pain syndrome 05/27/2017 No Yes E78.2 Mixed hyperlipidemia 05/27/2017 No Yes L89.93 Pressure ulcer of unspecified site, stage 3 05/27/2017 No Yes Inactive Problems Resolved  Problems Electronic Signature(s) Signed: 11/15/2017 1:43:31 AM By: Lenda KelpStone III, Hoyt PA-C Entered By: Lenda KelpStone III, Hoyt on 11/12/2017 10:58:51 Jessica BradyVERMAN, Tayna W. ([id number]) -------------------------------------------------------------------------------- Progress Note Details Patient Name: Jessica BradyVERMAN, Dalana W. Date of Service: 11/12/2017 11:15 AM Medical Record Number: [id number] Patient Account Number: [id number] Date of Birth/Sex: 1945-10-27 (72 y.o. F) Treating RN: Curtis Sitesorthy, Joanna Primary Care Provider: Annita BrodASENSO, PHILIP Other Clinician: Referring Provider: Annita BrodASENSO, PHILIP Treating Provider/Extender: Linwood DibblesSTONE III, HOYT Weeks in Treatment: 24 Subjective Chief Complaint Information obtained from Patient she is here for evaluation of a sacral ulcer and bilateral lower extremity ulcers History of Present Illness (HPI) 05/27/17-she is here in initial evaluation for a left-sided sacral stage IV pressure ulcer and bilateral lower extremity, lateral aspect, unstageable pressure ulcers. She is accompanied by her husband and her son, who are her primary caregivers. She is bedbound secondary to spinal stenosis. According to her son and husband she was hospitalized from 10/5-11/1 with healthcare associated pneumonia and altered mental status. During her hospitalization she was intubated, extubated on 10/27. She was discharged with Foley catheter and follow up with urology. According  to her son and spouse she developed the sacral ulcer during hospitalization. Home health has been applying Santyl daily. She does have a low air loss mattress and is repositioned every 2-3 hours per her family's report. According to the son and spouse she had an appointment with urology on 12/18 and during that appointment developed discoloration to her bilateral lower extremities which ultimately developed into unstageable pressure ulcers to the lateral aspects of her bilateral lower extremities. There has been no topical treatment  applied to these. She continues to have home health. There is no concerns expressed regarding dietary intake, stating she eats 3 meals a day, eating was provided; she is supplemented with boost with protein. 06/03/17-she is here in follow-up evaluation for sacral and bilateral lower extremity ulcers. Plain film x-ray done today reveals no distraction to the sacrum or coccyx, no visible abnormalities. Home health has ordered the negative pressure wound system but it has not been initiated. We will continue with Hydrofera Blue until initiation of negative pressure wound system and continue with Santyl to bilateral lower extremity ulcers. Follow-up next week 06/10/17-she is here in follow-up evaluation for sacral and bilateral lower extremity ulcers. The wound VAC will be available tomorrow per home health. We will initiate wound VAC therapy to the sacral ulcer 3 times weekly (Thursday, Saturday/Sunday, Tuesday). We will continue with Santyl to the lower extremity ulcers. The patient's son is checking into home health therapy [facility] the [facility], with the understanding that if VAC changes cannot be performed over the weekend he will need to change his mother's appointments to Monday, Wednesday or Friday. The sacral ulcer clinically does not appear infected but there has been a change in the amount of drainage acutely, there is no significant amount of devitalized tissue, there is no malodor. Wound culture was obtained to evaluate for occult infection we will hold off on antibiotic therapy until sensitivities are resulted. 06/18/17 on evaluation today patient appears to be doing fairly well in my opinion although this is the first time I have seen this patient she has been previously evaluated by Tacey RuizLeah here in our office. She is going to be switching to Fridays to see me due to the Wound VAC schedule being changed on Monday, Wednesday, and Friday. Subsequently she seems to be doing fairly well  with the Wound VAC. Her son who was present during evaluation today states that he somewhat stresses of the Wound VAC in making sure that it was functioning appropriately. With that being said everything seems to be working well he knows what settings on the [facility] itself to look at and ensure that it is functioning properly. Overall the wound appears to be nice and clean there is no need for debridement today. She has no discomfort in her bilateral lower extremity ulcers also appear to be improving based on measurements and what this honest tell me about the overall appearance. 07/02/17 on evaluation today I noted in patients wound bed that was actually an odor that had not previously been noted. Subsequently there was also a small area of bone that was not previously noted during my last evaluation. This did appear to be necrotic and was being somewhat forced out by the body around the region of granulation. She does not have any pain which is good news. Nonetheless the overall appearance of the ulcer is making me concerned for the patient having developed osteomyelitis. Currently she is not been on any antibiotics and the Wound VAC has been  doing fairly well in general. With that being said I do not think we need to continue the Wound VAC if she potentially has a bone infection. At least not until it is properly addressed with antibiotics. JASILYN, HOLDERMAN ([id number]) 07/09/17 on evaluation today patient appears to actually be doing very well in regard to her sacral ulcer compared to last week. There is actually no exposed bone at this point. 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 With that being said still I do believe she needs to have a referral to infectious disease due to the osteomyelitis I am gonna recommend that she continue with  the doxycycline based on the culture results which showed Eikenella Corrodens as the organism identified that tetracycline should work for. She does not seem to be having any discomfort whatsoever at this point. 07/16/17 on evaluation today patient actually appears to be doing rather well in regard to her sacral wound. I think that the original wound site actually appears much better than previously noted. With that being said she does have a new superficial injury on the right sacral region which appears to be due to according to the sign transport that occurred for her MRI unfortunately. Fortunately this is not too deep and I do think it can be managed but it was a new area that was not previously noted. Otherwise she has been tolerating the Dakin s soaked gauze packing without complication. 07/30/17 on evaluation today patient presents for reevaluation concerning her sacral wound. She has been tolerating the dressing changes without complication. With that being said her wound is doing so well at this point that I think she may be at the point where we could reinitiate the Wound VAC currently and hopefully see good results from this. I'm very pleased with how she has responded to the [facility] s soaked gauze packing. 08/13/17 evaluation today patient appears to be doing excellent in regard to her lower extremity ulcer this seems to be cleaning up very nicely. In regard to the sacral ulcer the area of trauma on the right lateral portion of the wound actually appears to be much better than previously noted during the last evaluation. The word has definitely filled in and the Wound VAC appears to be helpful I do believe. Overall I'm very pleased with how things seem to be progressing at this time. Patient likewise is also happy that things are doing well. 08/27/17 on evaluation today patient presents for follow-up concerning her ongoing issues with her sacral ulcer and lower extremity ulcer. Fortunately she  has been tolerating the dressing changes well complication the Wound VAC in general seems to have done very well up to this point. She does not have any evidence of infection which is good news. She does have some dark discoloration in central portion of the wound which is troublesome for the possibility of pressure injury to the site it sounds like her prior air mattress was not functioning properly her son has just bought a new one for her this is doing much better. Otherwise things seem to be progressing nicely. 09/10/17 on evaluation today patient presents for follow-up concerning her sacral wound and lower extremity ulcer. She has been tolerating the switch of her dressing changes to the Dakin s soaked gauze dressing very well in regard to the sacrum. The left lateral lower extremity  ulcer appears to have a new area open just distal to the one that we have been treating with Santyl and this is new since her last visit. There does not appear to be any evidence of infection which is good news. With that being said there's a lot of maceration here at the site and I feel this is likely due to the switch and the dressings it appears that due to the sticking of the dressings it will switch to utilizing a telfa pad over the Santyl. I think this caused a lot of drainage to be collected and situated in the region just under the dressing which has led to this causing which duration of breakdown. Overall there does not appear to be any significant pain which is good news. Of note I did actually have a conversation with the radiologist who is an interventional radiologist with [facility]. 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 With that being said there does not appear to be any rush to get this done according to the interventional radiologist whom I spoke with. Therefore after discussion it was determined that there gonna  wait about six months prior to considering the procedure to get time for hopefully the sacral wound to heal in the interim to at least some degree. 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 According to nursing staff these are all improved, the negative pressure wound therapy system was put on hold last week. We will resume negative pressure wound therapy today, continue with Santyl to the lower extremity and she will follow-up next week. 09/24/17 on evaluation today unfortunately the patient's wound appears to be doing significantly worse compared to last week's evaluation and even my valuation the week before. She actually has bone noted on the left wound margin in the region of undermining unfortunately. This was not noted during the last evaluation. I am concerned about the fact that this seems to be worse not better since our last visit with her. My biggest concern is that she's likely developing a worsening osteomyelitis. This was discussed with patient and her son today during the office visit. 05/03/17 on evaluation today patient actually appears to be doing rather well in regard to her sacral ulcer compared to last week's evaluation. She has been tolerating the dressing changes without complication. Fortunately even though we're not utilizing the Wound VAC she seems to be making some strides in seeing the wound area overall improved quite dramatically in my pinion in just one weeks time. She also seems to be staying off of this to the point that I do not see any evidence of new injury which is excellent news. Whatever she's been doing in that regard over the past week I would want her to continue. BERNICE, MCAULIFFE ([id number]) did also review the results of her bone culture which revealed group B strep as the responsible organism. Again this is what's causing her osteomyelitis she does have infectious disease appointment scheduled for  10/11/17 10/08/1898 valuation today patient appears to be doing better for the most part in regard to the sacral wound. Overall she is showing signs of having granulation of the bone which is good news. There is an area towards the left of the wound where she does seem to be showing some signs of opening this it would be due to additional pressure to the area unfortunately. There  does not appear to be any evidence of infection spreading which is good news that do not see any evidence of significant purulent discharge which is also good news. 10/26/17 on evaluation today patient sacral ulcer actually appears to be very healthy and doing well in regard to the overall appearance of the wound. With that being said she does seem to be having some issues at this point in time with some shear/friction injury of the sacrum from where she was transported to [facility] for her infectious disease appointment. She has been placed on IV antibiotic therapy I will have to go back and find that note for review as I did not have access to that today. Nonetheless I will add the next visit be sure to document the exact antibiotics that she is taking at this point. Nonetheless I do believe the wound is doing better there's no bone exposure nothing that requires debridement in regard to the sacral wound. Likewise her left lateral lower extremity ulcer also appears to be doing significantly better. In general I feel like things are showing signs of improvement all around. 11/12/17 on evaluation today patient appears to be doing rather well in regard to her sacral wound. In general I do see signs of granulation at this point. Fortunately there does not appear to be any evidence of infection which is good news as well. In general overall happy with the appearance. With that being said I'll be see I would like for this to be progressing more rapidly as with the patient and her son but nonetheless at least things do not appear to  be doing worse. Her lower extremity ulcer is doing significantly better. Patient History Information obtained from Patient. Family History No family history of Cancer, Diabetes, Heart Disease. Social History Never smoker, Marital Status - Married. Review of Systems (ROS) Constitutional Symptoms (General Health) Denies complaints or symptoms of Fever, Chills. Respiratory The patient has no complaints or symptoms. Cardiovascular The patient has no complaints or symptoms. Psychiatric The patient has no complaints or symptoms. Objective Constitutional Well-nourished and well-hydrated in no acute distress. Vitals Time Taken: 11:26 AM, Height: 70 in, Temperature: 98.4 F, Pulse: 72 bpm, Respiratory Rate: 16 breaths/min, Blood Pressure: 154/60 mmHg. ADAIJAH, ENDRES. ([id number]) Respiratory normal breathing without difficulty. clear to auscultation bilaterally. Cardiovascular regular rate and rhythm with normal S1, S2. Psychiatric this patient is able to make decisions and demonstrates good insight into disease process. Alert and Oriented x 3. pleasant and cooperative. General Notes: Patient's wound currently did not require any sharp debridement today which was good news. I feel like that she's making good progress in my suggestion at this point would be for [place] to continue with the Current wound care measures since she seems to be doing so well. Integumentary (Hair, Skin) Wound #1 status is Open. Original cause of wound was Pressure Injury. The wound is located on the Medial Sacrum. The wound measures 7.9cm length x 6cm width x 2.8cm depth; 37.228cm^2 area and 104.238cm^3 volume. There is bone, muscle, and Fat Layer (Subcutaneous Tissue) Exposed exposed. There is undermining starting at 11:00 and ending at 1:00 with a maximum distance of 3.4cm. There is a large amount of sanguinous drainage noted. The wound margin is distinct with the outline attached to the wound base. There is  medium (34-66%) red, pink, hyper - granulation within the wound bed. There is a medium (34-66%) amount of necrotic tissue within the wound bed including Eschar and Adherent Slough. The periwound skin appearance  exhibited: Induration, Scarring, Rubor, Erythema. The periwound skin appearance did not exhibit: Callus, Crepitus, Excoriation, Rash, Dry/Scaly, Maceration, Atrophie Blanche, Cyanosis, Ecchymosis, Hemosiderin Staining, Mottled, Pallor. The surrounding wound skin color is noted with erythema which is circumferential. Periwound temperature was noted as No Abnormality. The periwound has tenderness on palpation. Wound #2 status is Open. Original cause of wound was Gradually Appeared. The wound is located on the Left Lower Leg. The wound measures 0.5cm length x 0.4cm width x 0.1cm depth; 0.157cm^2 area and 0.016cm^3 volume. There is Fat Layer (Subcutaneous Tissue) Exposed exposed. There is no tunneling or undermining noted. There is a large amount of purulent drainage noted. The wound margin is distinct with the outline attached to the wound base. There is no granulation within the wound bed. There is a medium (34-66%) amount of necrotic tissue within the wound bed including Eschar and Adherent Slough. The periwound skin appearance exhibited: Erythema. The periwound skin appearance did not exhibit: Callus, Crepitus, Excoriation, Induration, Rash, Scarring, Dry/Scaly, Maceration, Atrophie Blanche, Cyanosis, Ecchymosis, Hemosiderin Staining, Mottled, Pallor, Rubor. The surrounding wound skin color is noted with erythema. Periwound temperature was noted as No Abnormality. Wound #5 status is Open. Original cause of wound was Trauma. The wound is located on the Left Gluteus. The wound measures 0.5cm length x 0.5cm width x 0.1cm depth; 0.196cm^2 area and 0.02cm^3 volume. The wound is limited to skin breakdown. There is no tunneling or undermining noted. There is a none present amount of drainage noted.  The wound margin is flat and intact. There is no granulation within the wound bed. There is no necrotic tissue within the wound bed. The periwound skin appearance exhibited: Rubor. Assessment Active Problems ICD-10 Pressure ulcer of sacral region, stage 4 Spinal stenosis, site unspecified Age-related physical debility Chronic pain syndrome Mixed hyperlipidemia Pressure ulcer of unspecified site, stage 3 LUISE, YAMAMOTO. ([id number]) Plan Wound Cleansing: Wound #1 Medial Sacrum: Other: - please cleanse sacral wound with dakins moistened gauze - do not spray dakins on wound Wound #2 Left Lower Leg: Clean wound with Normal Saline. Wound #5 Left Gluteus: Clean wound with Normal Saline. Anesthetic (add to Medication List): Wound #1 Medial Sacrum: Topical Lidocaine 4% cream applied to wound bed prior to debridement (In Clinic Only). Wound #2 Left Lower Leg: Topical Lidocaine 4% cream applied to wound bed prior to debridement (In Clinic Only). Wound #5 Left Gluteus: Topical Lidocaine 4% cream applied to wound bed prior to debridement (In Clinic Only). Skin Barriers/Peri-Wound Care: Wound #1 Medial Sacrum: Skin Prep Wound #2 Left Lower Leg: Antifungal cream Primary Wound Dressing: Wound #1 Medial Sacrum: Alginate - on superficial area to the right side of the wound use until wound vac is placed back on wound Pack wound with: - Dakin's soaked gauze use until wound vac is placed back on wound by [facility] Wound #2 Left Lower Leg: Collagen - plain collagen NO SILVER Wound #5 Left Gluteus: Silver Alginate Secondary Dressing: Wound #1 Medial Sacrum: ABD pad - secure with tape use until wound vac is placed back on wound XtraSorb - use until wound vac is placed back on wound Wound #2 Left Lower Leg: Boardered Foam Dressing - or ABD pad and tape Wound #5 Left Gluteus: Boardered Foam Dressing - or ABD pad and tape Dressing Change Frequency: Wound #1 Medial Sacrum: Change Dressing  Monday, Wednesday, Friday Wound #5 Left Gluteus: Change Dressing Monday, Wednesday, Friday Wound #2 Left Lower Leg: Change dressing every day. Follow-up Appointments: Wound #1 Medial Sacrum: Return Appointment  in 2 weeks. Wound #2 Left Lower Leg: Return Appointment in 2 weeks. Wound #5 Left Gluteus: Return Appointment in 2 weeks. Off-Loading: Wound #1 Medial Sacrum: EULALAH, RUPERT. ([id number]) Turn and reposition every 2 hours Wound #2 Left Lower Leg: Turn and reposition every 2 hours Additional Orders / Instructions: Wound #1 Medial Sacrum: Increase protein intake. Wound #2 Left Lower Leg: Increase protein intake. Home Health: Wound #1 Medial Sacrum: Continue Home Health Visits - Advanced HHRN to start wound vac Home Health Nurse may visit PRN to address patient s wound care needs. FACE TO FACE ENCOUNTER: MEDICARE and MEDICAID PATIENTS: I certify that this patient is under my care and that I had a face-to-face encounter that meets the physician face-to-face encounter requirements with this patient on this date. The encounter with the patient was in whole or in part for the following MEDICAL CONDITION: (primary reason for Home Healthcare) MEDICAL NECESSITY: I certify, that based on my findings, NURSING services are a medically necessary home health service. HOME BOUND STATUS: I certify that my clinical findings support that this patient is homebound (i.e., Due to illness or injury, pt requires aid of supportive devices such as crutches, cane, wheelchairs, walkers, the use of special transportation or the assistance of another person to leave their place of residence. There is a normal inability to leave the home and doing so requires considerable and taxing effort. Other absences are for medical reasons / religious services and are infrequent or of short duration when for other reasons). If current dressing causes regression in wound condition, may D/C ordered dressing  product/s and apply Normal Saline Moist Dressing daily until next Wound Healing Center / Other MD appointment. Notify Wound Healing Center of regression in wound condition at [phone]. Please direct any NON-WOUND related issues/requests for orders to patient's Primary Care Physician Wound #2 Left Lower Leg: Continue Home Health Visits - Advanced HHRN to start wound vac Home Health Nurse may visit PRN to address patient s wound care needs. FACE TO FACE ENCOUNTER: MEDICARE and MEDICAID PATIENTS: I certify that this patient is under my care and that I had a face-to-face encounter that meets the physician face-to-face encounter requirements with this patient on this date. The encounter with the patient was in whole or in part for the following MEDICAL CONDITION: (primary reason for Home Healthcare) MEDICAL NECESSITY: I certify, that based on my findings, NURSING services are a medically necessary home health service. HOME BOUND STATUS: I certify that my clinical findings support that this patient is homebound (i.e., Due to illness or injury, pt requires aid of supportive devices such as crutches, cane, wheelchairs, walkers, the use of special transportation or the assistance of another person to leave their place of residence. There is a normal inability to leave the home and doing so requires considerable and taxing effort. Other absences are for medical reasons / religious services and are infrequent or of short duration when for other reasons). If current dressing causes regression in wound condition, may D/C ordered dressing product/s and apply Normal Saline Moist Dressing daily until next Wound Healing Center / Other MD appointment. Notify Wound Healing Center of regression in wound condition at [phone]. Please direct any NON-WOUND related issues/requests for orders to patient's Primary Care Physician Wound #5 Left Gluteus: Continue Home Health Visits - [facility] to start wound  vac Home Health Nurse may visit PRN to address patient s wound care needs. FACE TO FACE ENCOUNTER: MEDICARE and MEDICAID PATIENTS: I certify that this  patient is under my care and that I had a face-to-face encounter that meets the physician face-to-face encounter requirements with this patient on this date. The encounter with the patient was in whole or in part for the following MEDICAL CONDITION: (primary reason for Home Healthcare) MEDICAL NECESSITY: I certify, that based on my findings, NURSING services are a medically necessary home health service. HOME BOUND STATUS: I certify that my clinical findings support that this patient is homebound (i.e., Due to illness or injury, pt requires aid of supportive devices such as crutches, cane, wheelchairs, walkers, the use of special transportation or the assistance of another person to leave their place of residence. There is a normal inability to leave the home and doing so requires considerable and taxing effort. Other absences are for medical reasons / religious services and are infrequent or of short duration when for other reasons). If current dressing causes regression in wound condition, may D/C ordered dressing product/s and apply Normal Saline Moist Dressing daily until next Wound Healing Center / Other MD appointment. Notify Wound Healing Center of regression in wound condition at [phone]. Please direct any NON-WOUND related issues/requests for orders to patient's Primary Care Physician Negative Pressure Wound Therapy: Wound #1 Medial Sacrum: Wound VAC settings at 125/130 mmHg continuous pressure. Use BLACK/GREEN foam to wound cavity. Use WHITE foam STARKISHA, TULLIS. ([id number]) to fill any tunnel/s and/or undermining. Change VAC dressing 3 X WEEK. Change canister as indicated when full. Nurse may titrate settings and frequency of dressing changes as clinically indicated. - [facility] to start wound vac HHRN to change on Mondays,  Wednesdays and every other Friday Home Health Nurse may d/c VAC for s/s of increased infection, significant wound regression, or uncontrolled drainage. Notify Wound Healing Center at [phone]. Number of foam/gauze pieces used in the dressing = We are gonna see were things stand at follow-up. If anything changes or worsens in the interim patient's son knows that you always call us for recommendations and obviously we can get her in sooner if need be. Nonetheless I'm hopeful that she will continue to show signs of improvement in weeks to come. Please see above for specific wound care orders. We will see patient for re-evaluation in 2 week(s) here in the clinic. If anything worsens or changes patient will contact our office for additional recommendations. Electronic Signature(s) Signed: 11/15/2017 1:43:31 AM By: Lenda Kelp PA-C Entered By: Lenda Kelp on 11/12/2017 13:40:22 Jessica [name] ([id number]) -------------------------------------------------------------------------------- ROS/PFSH Details [name]. Date of Service: 11/12/2017 11:15 AM Medical Record Number: [id number] Patient Account Number: [id number] Date of Birth/Sex: 10-12-1945 (72 y.o. F) Treating RN: Curtis Sites Primary Care Provider: Annita Brod Other Clinician: Referring Provider: Annita Brod Treating Provider/Extender: STONE III, HOYT Weeks in Treatment: 24 Information Obtained From Patient Wound History Constitutional Symptoms (General Health) Complaints and Symptoms: Negative for: Fever; Chills Respiratory Complaints and Symptoms: No Complaints or Symptoms Cardiovascular Complaints and Symptoms: No Complaints or Symptoms Psychiatric Complaints and Symptoms: No Complaints or Symptoms Immunizations Pneumococcal Vaccine: Received Pneumococcal Vaccination: No Implantable Devices Family and Social History Cancer: No; Diabetes: No; Heart Disease: No; Never smoker;  Marital Status - Married Physician Affirmation I have reviewed and agree with the above information. Electronic Signature(s) Signed: 11/12/2017 5:18:28 PM By: Curtis Sites Signed: 11/15/2017 1:43:31 AM By: Lenda Kelp PA-C Entered By: Lenda Kelp on 11/12/2017 13:39:09 Jessica [name] ([id number]) -------------------------------------------------------------------------------- SuperBill Details [name]. Date of Service: 11/12/2017 Medical Record Number:  [id number] Patient Account Number: [id number] Date of Birth/Sex: Dec 10, 1945 (72 y.o. F) Treating RN: Curtis Sites Primary Care Provider: Annita Brod Other Clinician: Referring Provider: Annita Brod Treating Provider/Extender: STONE III, HOYT Weeks in Treatment: 24 Diagnosis Coding ICD-10 Codes Code Description L89.154 Pressure ulcer of sacral region, stage 4 M48.00 Spinal stenosis, site unspecified R54 Age-related physical debility G89.4 Chronic pain syndrome E78.2 Mixed hyperlipidemia L89.93 Pressure ulcer of unspecified site, stage 3 Facility Procedures CPT4 Code: 40981191 Description: 99214 - WOUND CARE VISIT-LEV 4 EST PT Modifier: Quantity: 1 Physician Procedures CPT4 Code: 4782956 Description: 99213 - WC PHYS LEVEL 3 - EST PT ICD-10 Diagnosis Description L89.154 Pressure ulcer of sacral region, stage 4 R54 Age-related physical debility M48.00 Spinal stenosis, site unspecified L89.93 Pressure ulcer of unspecified site, stage 3 Modifier: Quantity: 1 Electronic Signature(s) Signed: 11/15/2017 1:43:31 AM By: Lenda Kelp PA-C Entered By: Lenda Kelp on 11/12/2017 13:40:59

## 2017-11-22 NOTE — Telephone Encounter (Signed)
Patient needs EMS transport for all appointments. Son called to cancel her appointment 8/13, as they just received a bill from EMS transport for over $6000.  They are having to juggle appointments right now to make sure she is able to get to wound care. Per her son, her wound "looks good, is healing well, has shrunk and is not as deep."  She still has a nurse coming three times weekly for wound vac changes, has labs drawn Monday and Wednesday.  She sees Dr Leonard SchwartzHoyt at [facility] every other week now (8/2 note available, next visit 8/16). Per son, she is on week 4 of IV vancomycin and unasyn therapy.  He is wondering if they can reschedule to the end of her IV therapy or later (end date 12/02/17 per [facility]). Please advise. Andree CossHowell, Macarthur Lorusso M, RN

## 2017-11-22 NOTE — Telephone Encounter (Signed)
Yes I think that is quite reasonable. Her labs look good and I am happy to hear that her wound is also looking better. May need to continue on oral therapy after IV.

## 2017-11-23 ENCOUNTER — Ambulatory Visit: Payer: Self-pay | Admitting: Family

## 2017-11-24 ENCOUNTER — Encounter: Payer: Self-pay | Admitting: Infectious Diseases

## 2017-11-26 ENCOUNTER — Encounter: Payer: Medicare Other | Admitting: Physician Assistant

## 2017-11-26 DIAGNOSIS — L89154 Pressure ulcer of sacral region, stage 4: Secondary | ICD-10-CM | POA: Diagnosis not present

## 2017-11-29 ENCOUNTER — Encounter: Payer: Self-pay | Admitting: Infectious Diseases

## 2017-12-01 ENCOUNTER — Encounter: Payer: Self-pay | Admitting: Infectious Diseases

## 2017-12-02 ENCOUNTER — Encounter: Payer: Self-pay | Admitting: Family

## 2017-12-02 ENCOUNTER — Telehealth: Payer: Self-pay

## 2017-12-02 ENCOUNTER — Ambulatory Visit (INDEPENDENT_AMBULATORY_CARE_PROVIDER_SITE_OTHER): Payer: Medicare Other | Admitting: Family

## 2017-12-02 VITALS — BP 124/69 | HR 72 | Temp 98.1°F

## 2017-12-02 DIAGNOSIS — M4628 Osteomyelitis of vertebra, sacral and sacrococcygeal region: Secondary | ICD-10-CM

## 2017-12-02 MED ORDER — DOXYCYCLINE HYCLATE 100 MG PO TABS: 100.0000 mg | ORAL_TABLET | Freq: Two times a day (BID) | ORAL | 0 refills | Status: DC

## 2017-12-02 MED ORDER — AMOXICILLIN-POT CLAVULANATE 875-125 MG PO TABS: 1.0000 | ORAL_TABLET | Freq: Two times a day (BID) | ORAL | 0 refills | Status: DC

## 2017-12-02 NOTE — Telephone Encounter (Signed)
Per Tammy SoursGreg, contacted [facility] to have [facility] removed  On 12/03/17 with last dose of antibiotics today 12/02/17.

## 2017-12-02 NOTE — Patient Instructions (Addendum)
Nice to see you.  Please continue with follow up for wound care.  I am going to send in prescriptions for Augmentin and doxycycline for you to take for the next 2 weeks.   If you have worsening symptoms please let us know.  Plan for follow up in about 1 month with Judeth CornfieldStephanie or Tammy SoursGreg.

## 2017-12-02 NOTE — Assessment & Plan Note (Signed)
[name] will be completing her 6 weeks of antimicrobial therapy with Vancomycin and Ampicillin-sulbactam with continued clinical improvements in her wound. She has a wound vac on today and continues to follow up with wound care. Discussed importance of continued offloading and nutrition to continue to allow the wound to heal. Given her increased inflammatory markers I will have her complete 2 weeks of oral antibiotics following completion of IV antibiotics today. Orders will be placed for PICC line removal. Continue to follow up with wound care. Recommend follow up in 1 month or sooner if needed for completion of treatment.

## 2017-12-02 NOTE — Progress Notes (Signed)
Subjective:    Patient ID: Jessica [name], female    DOB: Oct 08, 1945, 72 y.o.   MRN: [id number]  Chief Complaint  Patient presents with  . Sacral osteomyelitis    HPI:  Jessica [name] is a 72 y.o. female who presents today for routine follow up of sacral osteomyelitis.   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 Bone culuttres performed by wound care were positive for Group B Streptococcus and Diptheroids. She was started on a 6 week course of vancomycin and ampicillin-sulbactam to cover for the multiple organisms. Her completion date for the antibiotics is 12/02/17. Review of her weekly blood work showed decreasing inflammatory markers until her most recent on 11/29/17 increasing to CRP of 33 and ESR of 47.  Jessica [name] has been taking her medications as prescribed with no missed doses or adverse side effects. She is completing her 6 weeks of IV therapy today. Wound is significantly improved since initial evaluation in early July. She continues to have the wound VAC in place. She has no systemic symptoms and denies fevers, chills or sweats.    Allergies  Allergen Reactions  . Ambien [Zolpidem Tartrate] Other (See Comments)    Reaction: altered mental status  . Zinc Other (See Comments)    Zinc ointments cause skin irritation      Outpatient Medications Prior to Visit  Medication Sig Dispense Refill  . albuterol (PROVENTIL HFA;VENTOLIN HFA) 108 (90 Base) MCG/ACT inhaler Inhale 2 puffs into the lungs every 4 (four) hours as needed for wheezing or shortness of breath.    Marland Kitchen albuterol (PROVENTIL) (2.5 MG/3ML) 0.083% nebulizer solution Take 2.5 mg by nebulization every 6 (six) hours as needed for wheezing or shortness of breath.    Marland Kitchen amlodipine-atorvastatin (CADUET) 2.5-10 MG tablet amlodipine 2.5 mg-atorvastatin 10 mg tablet  Take 1 tablet every day by oral route.    . ARIPiprazole (ABILIFY) 2 MG tablet 1 BY MOUTH ONCE A DAY FOR MOOD AND HALLUCINATIONS  4  . aspirin EC 81 MG tablet Take 81 mg by mouth daily.    . baclofen (LIORESAL) 20 MG tablet Take 5-20 mg by mouth 3 (three) times daily. 10 mg every morning and 20 mg every evening    . furosemide (LASIX) 20 MG tablet Take 20 mg by mouth daily.    Marland Kitchen levothyroxine (SYNTHROID, LEVOTHROID) 150 MCG tablet Take 150 mcg by mouth daily before breakfast.    . polyethylene glycol (MIRALAX / GLYCOLAX) packet Take 17 g by mouth daily.    . potassium chloride SA (K-DUR,KLOR-CON) 20 MEQ tablet Take 30 mEq by mouth daily.     . pregabalin (LYRICA) 150 MG capsule Take 150 mg by mouth 3 (three) times daily.    . Venlafaxine HCl 225 MG TB24 Take 225 mg by mouth daily.    . vitamin B-12 (CYANOCOBALAMIN) 1000 MCG tablet Take 1,000 mcg by mouth daily.    . Vitamin D, Ergocalciferol, (DRISDOL) 50000 units CAPS capsule Take 50,000 Units by mouth every 30 (thirty) days.    Marland Kitchen acetaminophen (TYLENOL) 325 MG tablet Take 650 mg by mouth every 6 (six) hours as needed for mild pain.    . collagenase (SANTYL) ointment Apply topically daily. (Patient not taking: Reported on 10/11/2017) 15 g 0  . liver oil-zinc oxide (DESITIN) 40 % ointment Apply 1 application topically  as needed (affected area). (Patient not taking: Reported on 09/01/2017) 56.7 g 0   No facility-administered medications prior to visit.      Past Medical History:  Diagnosis Date  . Chronic pain   . Depression   . Diastolic heart failure ([place])   . HLD (hyperlipidemia)   . HTN (hypertension)   . Hypothyroidism   . Morbid obesity ([place])   . Recurrent UTI   . Spinal stenosis   . Visual hallucinations       Past Surgical History:  Procedure Laterality Date  . ABDOMINAL HYSTERECTOMY    . BACK SURGERY    . HEMORROIDECTOMY    . IR RADIOLOGIST EVAL & MGMT  09/01/2017      Family History  Problem Relation Age of Onset    . Heart disease Father   . Deep vein thrombosis Father       Social History   Socioeconomic History  . Marital status: Married    Spouse name: Not on file  . Number of children: Not on file  . Years of education: Not on file  . Highest education level: Not on file  Occupational History  . Not on file  Social Needs  . Financial resource strain: Not on file  . Food insecurity:    Worry: Not on file    Inability: Not on file  . Transportation needs:    Medical: Not on file    Non-medical: Not on file  Tobacco Use  . Smoking status: Never Smoker  . Smokeless tobacco: Former Systems developer    Types: Snuff  Substance and Sexual Activity  . Alcohol use: No  . Drug use: No  . Sexual activity: Not on file  Lifestyle  . Physical activity:    Days per week: Not on file    Minutes per session: Not on file  . Stress: Not on file  Relationships  . Social connections:    Talks on phone: Not on file    Gets together: Not on file    Attends religious service: Not on file    Active member of club or organization: Not on file    Attends meetings of clubs or organizations: Not on file    Relationship status: Not on file  . Intimate partner violence:    Fear of current or ex partner: Not on file    Emotionally abused: Not on file    Physically abused: Not on file    Forced sexual activity: Not on file  Other Topics Concern  . Not on file  Social History Narrative  . Not on file      Review of Systems  Constitutional: Negative for chills, fatigue, fever and unexpected weight change.  Respiratory: Negative for chest tightness and shortness of breath.   Cardiovascular: Negative for chest pain and leg swelling.  Skin: Positive for wound.       Objective:    BP 124/69   Pulse 72   Temp 98.1 F (36.7 C) (Oral)  Nursing note and vital signs reviewed.  Physical Exam  Constitutional: She is oriented to person, place, and time. She appears well-developed and well-nourished. No  distress.  Lying on the ambulance stretcher; pleasant.   Cardiovascular: Normal rate, regular rhythm, normal heart sounds and intact distal pulses. Exam reveals no gallop and no friction rub.  No murmur heard. Pulmonary/Chest: Effort normal and breath sounds normal. No stridor. No respiratory distress. She has no wheezes. She has no rales. She exhibits no  tenderness.  Neurological: She is alert and oriented to person, place, and time.  Skin: Skin is warm and dry.  Wound vac in place and patent with good suction. Appears clean, dry and intact with scant serous drainage in the past 24 hours.   Psychiatric: She has a normal mood and affect.        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 Cyphers will be completing her 6 weeks of antimicrobial therapy with Vancomycin and Ampicillin-sulbactam with continued clinical improvements in her wound. She has a wound vac on today and continues to follow up with wound care. Discussed importance of continued offloading and nutrition to continue to allow the wound to heal. Given her increased inflammatory markers I will have her complete 2 weeks of oral antibiotics following completion of IV antibiotics today. Orders will be placed for PICC line removal. Continue to follow up with wound care. Recommend follow up in 1 month or sooner if needed for completion of treatment.           I am having Jessica [name] start on doxycycline and amoxicillin-clavulanate. I am also having her maintain her vitamin B-12, aspirin EC, levothyroxine, furosemide, potassium chloride SA, baclofen, pregabalin, Venlafaxine HCl, albuterol, acetaminophen, polyethylene glycol, Vitamin D (Ergocalciferol), albuterol, collagenase, liver oil-zinc oxide, ARIPiprazole, and amlodipine-atorvastatin.   Meds ordered this encounter  Medications  . doxycycline (VIBRA-TABS) 100 MG tablet      Sig: Take 1 tablet (100 mg total) by mouth 2 (two) times daily.    Dispense:  28 tablet    Refill:  0    Order Specific Question:   Supervising Provider    Answer:   Carlyle Basques [[id number]]  . amoxicillin-clavulanate (AUGMENTIN) 875-125 MG tablet    Sig: Take 1 tablet by mouth 2 (two) times daily.    Dispense:  28 tablet    Refill:  0    Order Specific Question:   Supervising Provider    Answer:   Carlyle Basques [[id number]]     Follow-up: Return in about 1 month (around 01/02/2018), or if symptoms worsen or fail to improve.    Terri Piedra, MSN, FNP-C Nurse Practitioner [facility] Office phone: [phone] Pager: [place] number: [phone]

## 2017-12-09 ENCOUNTER — Encounter: Payer: Medicare Other | Admitting: Physician Assistant

## 2017-12-09 DIAGNOSIS — L89154 Pressure ulcer of sacral region, stage 4: Secondary | ICD-10-CM | POA: Diagnosis not present

## 2017-12-09 NOTE — Progress Notes (Signed)
DORINE, [name] ([id number]) Visit Report for 11/26/2017 Arrival Information Details [name], Jessica [name]. Date of Service: 11/26/2017 11:15 AM Medical Record Number: [id number] Patient Account Number: [id number] Date of Birth/Sex: 11/30/1945 (72 y.o. F) Treating RN: Renne Crigler Primary Care Nikolas Casher: Annita Brod Other Clinician: Referring Aleecia Tapia: Annita Brod Treating Denia Mcvicar/Extender: Linwood Dibbles, HOYT Weeks in Treatment: 26 Visit Information History Since Last Visit All ordered tests and consults were completed: No Patient Arrived: Stretcher Added or deleted any medications: No Arrival Time: 11:12 Any new allergies or adverse reactions: No Accompanied By: self Had a fall or experienced change in No Transfer Assistance: None activities of daily living that may affect Patient Identification Verified: Yes risk of falls: Secondary Verification Process Yes Signs or symptoms of abuse/neglect since last visito No Completed: Hospitalized since last visit: No Patient Has Alerts: Yes Implantable device outside of the clinic excluding No Patient Alerts: ALLERGIC TO cellular tissue based products placed in the center ZINC since last visit: Pain Present Now: No Electronic Signature(s) Signed: 11/26/2017 4:21:17 PM By: Renne Crigler Entered By: Renne Crigler on 11/26/2017 11:13:07 Jessica [name] ([id number]) -------------------------------------------------------------------------------- Lower Extremity Assessment Details [name]. Date of Service: 11/26/2017 11:15 AM Medical Record Number: [id number] Patient Account Number: [id number] Date of Birth/Sex: Sep 09, 1945 (72 y.o. F) Treating RN: Renne Crigler Primary Care Ardie Dragoo: Annita Brod Other Clinician: Referring Inigo Lantigua: Annita Brod Treating Adithi Gammon/Extender: STONE III, HOYT Weeks in Treatment: 26 Edema Assessment Assessed: [Left: No] [Right: No] Edema: [Left:  N] [Right: o] Vascular Assessment Claudication: Claudication Assessment [Left:None] Pulses: Dorsalis Pedis Palpable: [Left:Yes] Posterior Tibial Extremity colors, hair growth, and conditions: Extremity Color: [Left:Normal] Hair Growth on Extremity: [Left:Yes] Toe Nail Assessment Left: Right: Thick: No Discolored: No Deformed: No Improper Length and Hygiene: No Electronic Signature(s) Signed: 11/26/2017 4:21:17 PM By: Renne Crigler Entered By: Renne Crigler on 11/26/2017 11:31:15 Jessica [name] ([id number]) -------------------------------------------------------------------------------- Multi Wound Chart Details [name]. Date of Service: 11/26/2017 11:15 AM Medical Record Number: [id number] Patient Account Number: [id number] Date of Birth/Sex: 11/24/1945 (72 y.o. F) Treating RN: Curtis Sites Primary Care Danikah Budzik: Annita Brod Other Clinician: Referring Garritt Molyneux: Annita Brod Treating Kanija Remmel/Extender: STONE III, HOYT Weeks in Treatment: 26 Vital Signs Height(in): 70 Pulse(bpm): 70 Weight(lbs): Blood Pressure(mmHg): 134/59 Body Mass Index(BMI): Temperature(F): 98.3 Respiratory Rate 18 (breaths/min): Photos: Wound Location: Sacrum - Medial Left Lower Leg Right Gluteus Wounding Event: Pressure Injury Gradually Appeared Trauma Primary Etiology: Pressure Ulcer Pressure Ulcer Skin Tear Date Acquired: 01/15/2017 04/28/2017 11/12/2017 Weeks of Treatment: 26 26 2  Wound Status: Open Open Healed - Epithelialized Measurements L x [name] x D 7.3x5.3x2.2 0.2x0.2x0.1 0x0x0 (cm) Area (cm) : 30.387 0.031 0 Volume (cm) : 66.852 0.003 0 % Reduction in Area: 17.50% 93.40% 100.00% % Reduction in Volume: -81.40% 96.80% 100.00% Starting Position 1 7 (o'clock): Ending Position 1 12 (o'clock): Maximum Distance 1 (cm): 3.4 Undermining: Yes N/A N/A Classification: Category/Stage IV Category/Stage III Partial Thickness Exudate Amount: Large N/A  N/A Exudate Type: Sanguinous N/A N/A Exudate Color: red N/A N/A Wound Margin: Distinct, outline attached N/A N/A Granulation Amount: Medium (34-66%) N/A N/A Granulation Quality: Red, Pink, Hyper-granulation N/A N/A Necrotic Amount: Medium (34-66%) N/A N/A Necrotic Tissue: Eschar, Adherent Slough N/A N/A Exposed Structures: Fat Layer (Subcutaneous N/A N/A Tissue) Exposed: Yes Jessica, [name]. ([id number]) Muscle: Yes Bone: Yes Fascia: No Tendon: No Joint: No Epithelialization: None N/A N/A Periwound Skin Texture: Induration: Yes No Abnormalities Noted No Abnormalities Noted Scarring: Yes Excoriation: No Callus: No Crepitus: No  Rash: No Periwound Skin Moisture: Maceration: No No Abnormalities Noted No Abnormalities Noted Dry/Scaly: No Periwound Skin Color: Erythema: Yes No Abnormalities Noted No Abnormalities Noted Rubor: Yes Atrophie Blanche: No Cyanosis: No Ecchymosis: No Hemosiderin Staining: No Mottled: No Pallor: No Erythema Location: Circumferential N/A N/A Temperature: No Abnormality N/A N/A Tenderness on Palpation: Yes No No Wound Preparation: Ulcer Cleansing: N/A N/A Rinsed/Irrigated with Saline Topical Anesthetic Applied: Other: lidocaine 4% Treatment Notes Electronic Signature(s) Signed: 11/26/2017 5:05:24 PM By: Curtis Sitesorthy, Joanna Entered By: Curtis Sitesorthy, Joanna on 11/26/2017 12:07:16 Jessica BradyVERMAN, Meghin [name]. ([id number]) -------------------------------------------------------------------------------- Multi-Disciplinary Care Plan Details Patient Name: Jessica BradyVERMAN, Andreal [name]. Date of Service: 11/26/2017 11:15 AM Medical Record Number: [id number] Patient Account Number: [id number] Date of Birth/Sex: Dec 29, 1945 (72 y.o. F) Treating RN: Curtis Sitesorthy, Joanna Primary Care Ijeoma Loor: Annita BrodASENSO, PHILIP Other Clinician: Referring Brion Sossamon: Annita BrodASENSO, PHILIP Treating Pharell Rolfson/Extender: STONE III, HOYT Weeks in Treatment: 26 Active Inactive ` Orientation to the Wound Care Program Nursing  Diagnoses: Knowledge deficit related to the wound healing center program Goals: Patient/caregiver will verbalize understanding of the Wound Healing Center Program Date Initiated: 06/03/2017 Target Resolution Date: 06/17/2017 Goal Status: Active Interventions: Provide education on orientation to the wound center Notes: ` Pressure Nursing Diagnoses: Knowledge deficit related to causes and risk factors for pressure ulcer development Knowledge deficit related to management of pressures ulcers Potential for impaired tissue integrity related to pressure, friction, moisture, and shear Goals: Patient will remain free from development of additional pressure ulcers Date Initiated: 06/03/2017 Target Resolution Date: 06/17/2017 Goal Status: Active Patient will remain free of pressure ulcers Date Initiated: 06/03/2017 Target Resolution Date: 06/17/2017 Goal Status: Active Patient/caregiver will verbalize risk factors for pressure ulcer development Date Initiated: 06/03/2017 Target Resolution Date: 06/17/2017 Goal Status: Active Interventions: Assess: immobility, friction, shearing, incontinence upon admission and as needed Assess potential for pressure ulcer upon admission and as needed Notes: ` Wound/Skin Impairment Jessica BradyOVERMAN, Deletha [name]. ([id number]) Nursing Diagnoses: Impaired tissue integrity Goals: Patient/caregiver will verbalize understanding of skin care regimen Date Initiated: 06/03/2017 Target Resolution Date: 06/17/2017 Goal Status: Active Ulcer/skin breakdown will have a volume reduction of 30% by week 4 Date Initiated: 06/03/2017 Target Resolution Date: 06/17/2017 Goal Status: Active Interventions: Assess ulceration(s) every visit Treatment Activities: Patient referred to home care : 06/03/2017 Skin care regimen initiated : 06/03/2017 Notes: Electronic Signature(s) Signed: 11/26/2017 5:05:24 PM By: Curtis Sitesorthy, Joanna Entered By: Curtis Sitesorthy, Joanna on 11/26/2017 12:07:08 Jessica BradyVERMAN, Arleth [name].  ([id number]) -------------------------------------------------------------------------------- Pain Assessment Details Patient Name: Jessica BradyVERMAN, Chelsia [name]. Date of Service: 11/26/2017 11:15 AM Medical Record Number: [id number] Patient Account Number: [id number] Date of Birth/Sex: Dec 29, 1945 (72 y.o. F) Treating RN: Renne CriglerFlinchum, Cheryl Primary Care Duana Benedict: Annita BrodASENSO, PHILIP Other Clinician: Referring Siri Buege: Annita BrodASENSO, PHILIP Treating Bo Teicher/Extender: STONE III, HOYT Weeks in Treatment: 26 Active Problems Location of Pain Severity and Description of Pain Patient Has Paino No Site Locations Pain Management and Medication Current Pain Management: Electronic Signature(s) Signed: 11/26/2017 4:21:17 PM By: Renne CriglerFlinchum, Cheryl Entered By: Renne CriglerFlinchum, Cheryl on 11/26/2017 11:13:15 Jessica BradyVERMAN, Blaike [name]. ([id number]) -------------------------------------------------------------------------------- Wound Assessment Details Patient Name: Jessica BradyVERMAN, Marielys [name]. Date of Service: 11/26/2017 11:15 AM Medical Record Number: [id number] Patient Account Number: [id number] Date of Birth/Sex: Dec 29, 1945 (72 y.o. F) Treating RN: Renne CriglerFlinchum, Cheryl Primary Care Eilam Shrewsbury: Annita BrodASENSO, PHILIP Other Clinician: Referring Elysa Womac: Annita BrodASENSO, PHILIP Treating Elissa Grieshop/Extender: STONE III, HOYT Weeks in Treatment: 26 Wound Status Wound Number: 1 Primary Etiology: Pressure Ulcer Wound Location: Sacrum - Medial Wound Status: Open Wounding Event: Pressure Injury Date Acquired: 01/15/2017 Weeks Of Treatment: 26 Clustered Wound: No Photos Photo Uploaded By: Renne CriglerFlinchum, Cheryl  on 11/26/2017 11:58:45 Wound Measurements Length: (cm) 7.3 Width: (cm) 5.3 Depth: (cm) 2.2 Area: (cm) 30.387 Volume: (cm) 66.852 % Reduction in Area: 17.5% % Reduction in Volume: -81.4% Epithelialization: None Undermining: Yes Starting Position (o'clock): 7 Ending Position (o'clock): 12 Maximum Distance: (cm) 3.4 Wound Description Classification:  Category/Stage IV Wound Margin: Distinct, outline attached Exudate Amount: Large Exudate Type: Sanguinous Exudate Color: red Foul Odor After Cleansing: No Slough/Fibrino Yes Wound Bed Granulation Amount: Medium (34-66%) Exposed Structure Granulation Quality: Red, Pink, Hyper-granulation Fascia Exposed: No Necrotic Amount: Medium (34-66%) Fat Layer (Subcutaneous Tissue) Exposed: Yes Necrotic Quality: Eschar, Adherent Slough Tendon Exposed: No Muscle Exposed: Yes Necrosis of Muscle: No RHEAGAN, NAYAK. ([id number]) Joint Exposed: No Bone Exposed: Yes Periwound Skin Texture Texture Color No Abnormalities Noted: No No Abnormalities Noted: No Callus: No Atrophie Blanche: No Crepitus: No Cyanosis: No Excoriation: No Ecchymosis: No Induration: Yes Erythema: Yes Rash: No Erythema Location: Circumferential Scarring: Yes Hemosiderin Staining: No Mottled: No Moisture Pallor: No No Abnormalities Noted: No Rubor: Yes Dry / Scaly: No Maceration: No Temperature / Pain Temperature: No Abnormality Tenderness on Palpation: Yes Wound Preparation Ulcer Cleansing: Rinsed/Irrigated with Saline Topical Anesthetic Applied: Other: lidocaine 4%, Electronic Signature(s) Signed: 11/26/2017 4:21:17 PM By: Renne Crigler Entered By: Renne Crigler on 11/26/2017 11:27:35 Jessica [name] ([id number]) -------------------------------------------------------------------------------- Wound Assessment Details [name]. Date of Service: 11/26/2017 11:15 AM Medical Record Number: [id number] Patient Account Number: [id number] Date of Birth/Sex: Dec 19, 1945 (72 y.o. F) Treating RN: Renne Crigler Primary Care Cyan Moultrie: Annita Brod Other Clinician: Referring Reyes Aldaco: Annita Brod Treating Deari Sessler/Extender: STONE III, HOYT Weeks in Treatment: 26 Wound Status Wound Number: 2 Primary Etiology: Pressure Ulcer Wound Location: Left Lower Leg Wound Status:  Open Wounding Event: Gradually Appeared Date Acquired: 04/28/2017 Weeks Of Treatment: 26 Clustered Wound: No Photos Photo Uploaded By: Renne Crigler on 11/26/2017 11:58:46 Wound Measurements Length: (cm) 0.2 Width: (cm) 0.2 Depth: (cm) 0.1 Area: (cm) 0.031 Volume: (cm) 0.003 % Reduction in Area: 93.4% % Reduction in Volume: 96.8% Wound Description Classification: Category/Stage III Periwound Skin Texture Texture Color No Abnormalities Noted: No No Abnormalities Noted: No Moisture No Abnormalities Noted: No Electronic Signature(s) Signed: 11/26/2017 4:21:17 PM By: Renne Crigler Entered By: Renne Crigler on 11/26/2017 11:24:28 Jessica [name] ([id number]) -------------------------------------------------------------------------------- Wound Assessment Details [name]. Date of Service: 11/26/2017 11:15 AM Medical Record Number: [id number] Patient Account Number: [id number] Date of Birth/Sex: [birth year] (72 y.o. F) Treating RN: Curtis Sites Primary Care Elmarie Devlin: Annita Brod Other Clinician: Referring Amunique Neyra: Annita Brod Treating Jhovany Weidinger/Extender: STONE III, HOYT Weeks in Treatment: 26 Wound Status Wound Number: 5 Primary Etiology: Skin Tear Wound Location: Right Gluteus Wound Status: Healed - Epithelialized Wounding Event: Trauma Date Acquired: 11/12/2017 Weeks Of Treatment: 2 Clustered Wound: No Photos Photo Uploaded By: Renne Crigler on 11/26/2017 11:59:07 Wound Measurements Length: (cm) 0 Width: (cm) 0 Depth: (cm) 0 Area: (cm) 0 Volume: (cm) 0 % Reduction in Area: 100% % Reduction in Volume: 100% Wound Description Classification: Partial Thickness Periwound Skin Texture Texture Color No Abnormalities Noted: No No Abnormalities Noted: No Moisture No Abnormalities Noted: No Electronic Signature(s) Signed: 11/26/2017 5:05:24 PM By: Curtis Sites Entered By: Curtis Sites on 11/26/2017  12:06:59 Jessica [name] ([id number]) -------------------------------------------------------------------------------- Vitals Details [name]. Date of Service: 11/26/2017 11:15 AM Medical Record Number: [id number] Patient Account Number: [id number] Date of Birth/Sex: 25-Oct-1945 (72 y.o. F) Treating RN: Renne Crigler Primary Care Lyndel Dancel: Annita Brod Other Clinician: Referring Lauralyn Shadowens: Annita Brod Treating Sireen Halk/Extender:  STONE III, HOYT Weeks in Treatment: 26 Vital Signs Time Taken: 11:13 Temperature (F): 98.3 Height (in): 70 Pulse (bpm): 70 Respiratory Rate (breaths/min): 18 Blood Pressure (mmHg): 134/59 Reference Range: 80 - 120 mg / dl Electronic Signature(s) Signed: 11/26/2017 4:21:17 PM By: Renne Crigler Entered By: Renne Crigler on 11/26/2017 11:17:07

## 2017-12-09 NOTE — Progress Notes (Signed)
Jessica, [name] ([id number]) Visit Report for 11/26/2017 Chief Complaint Document Details [name], STANKUS. Date of Service: 11/26/2017 11:15 AM Medical Record Number: [id number] Patient Account Number: [id number] Date of Birth/Sex: 10/15/1945 (72 y.o. F) Treating RN: Curtis Sites Primary Care Provider: Annita Brod Other Clinician: Referring Provider: Annita Brod Treating Provider/Extender: STONE III, HOYT Weeks in Treatment: 26 Information Obtained from: Patient Chief Complaint she is here for evaluation of a sacral ulcer and bilateral lower extremity ulcers Electronic Signature(s) Signed: 11/29/2017 8:05:21 AM By: Lenda Kelp PA-C Entered By: Lenda Kelp on 11/26/2017 11:17:08 Jessica [name] ([id number]) -------------------------------------------------------------------------------- Debridement Details [name]. Date of Service: 11/26/2017 11:15 AM Medical Record Number: [id number] Patient Account Number: [id number] Date of Birth/Sex: [birth year] (72 y.o. F) Treating RN: Curtis Sites Primary Care Provider: Annita Brod Other Clinician: Referring Provider: Annita Brod Treating Provider/Extender: STONE III, HOYT Weeks in Treatment: 26 Debridement Performed for Wound #1 Medial Sacrum Assessment: Performed By: Physician STONE III, HOYT E., PA-C Debridement Type: Debridement Pre-procedure Verification/Time Yes - 12:07 Out Taken: Start Time: 12:07 Pain Control: Lidocaine 4% Topical Solution Total Area Debrided (L x W): 2 (cm) x 2 (cm) = 4 (cm) Tissue and other material Viable, Non-Viable, Slough, Subcutaneous, Slough debrided: Level: Skin/Subcutaneous Tissue Debridement Description: Excisional Instrument: Curette Bleeding: Minimum Hemostasis Achieved: Pressure End Time: 12:12 Procedural Pain: 0 Post Procedural Pain: 0 Response to Treatment: Procedure was tolerated well Level of Consciousness: Awake and  Alert Post Debridement Measurements of Total Wound Length: (cm) 7.3 Stage: Category/Stage IV Width: (cm) 5.3 Depth: (cm) 2.3 Volume: (cm) 69.89 Character of Wound/Ulcer Post Improved Debridement: Post Procedure Diagnosis Same as Pre-procedure Electronic Signature(s) Signed: 11/26/2017 5:05:24 PM By: Curtis Sites Signed: 11/29/2017 8:05:21 AM By: Lenda Kelp PA-C Entered By: Curtis Sites on 11/26/2017 12:12:21 Jessica [name] ([id number]) -------------------------------------------------------------------------------- HPI Details [name]. Date of Service: 11/26/2017 11:15 AM Medical Record Number: [id number] Patient Account Number: [id number] Date of Birth/Sex: 25-Feb-1946 (72 y.o. F) Treating RN: Curtis Sites Primary Care Provider: Annita Brod Other Clinician: Referring Provider: Annita Brod Treating Provider/Extender: Linwood Dibbles, HOYT Weeks in Treatment: 26 History of Present Illness HPI Description: 05/27/17-she is here in initial evaluation for a left-sided sacral stage IV pressure ulcer and bilateral lower extremity, lateral aspect, unstageable pressure ulcers. She is accompanied by her husband and her son, who are her primary caregivers. She is bedbound secondary to spinal stenosis. According to her son and husband she was hospitalized from 10/5-11/1 with healthcare associated pneumonia and altered mental status. During her hospitalization she was intubated, extubated on 10/27. She was discharged with Foley catheter and follow up with urology. According to her son and spouse she developed the sacral ulcer during hospitalization. Home health has been applying Santyl daily. She does have a low air loss mattress and is repositioned every 2-3 hours per her family's report. According to the son and spouse she had an appointment with urology on 12/18 and during that appointment developed discoloration to her bilateral lower extremities which  ultimately developed into unstageable pressure ulcers to the lateral aspects of her bilateral lower extremities. There has been no topical treatment applied to these. She continues to have home health. There is no concerns expressed regarding dietary intake, stating she eats 3 meals a day, eating was provided; she is supplemented with boost with protein. 06/03/17-she is here in follow-up evaluation for sacral and bilateral lower extremity ulcers. Plain film x-ray done today reveals no distraction to  the sacrum or coccyx, no visible abnormalities. Home health has ordered the negative pressure wound system but it has not been initiated. We will continue with Hydrofera Blue until initiation of negative pressure wound system and continue with Santyl to bilateral lower extremity ulcers. Follow-up next week 06/10/17-she is here in follow-up evaluation for sacral and bilateral lower extremity ulcers. The wound VAC will be available tomorrow per home health. We will initiate wound VAC therapy to the sacral ulcer 3 times weekly (Thursday, Saturday/Sunday, Tuesday). We will continue with Santyl to the lower extremity ulcers. The patient's son is checking into home health therapy over the weekend for [facility] changes, with the understanding that if VAC changes cannot be performed over the weekend he will need to change his mother's appointments to Monday, Wednesday or Friday. The sacral ulcer clinically does not appear infected but there has been a change in the amount of drainage acutely, there is no significant amount of devitalized tissue, there is no malodor. Wound culture was obtained to evaluate for occult infection we will hold off on antibiotic therapy until sensitivities are resulted. 06/18/17 on evaluation today patient appears to be doing fairly well in my opinion although this is the first time I have seen this patient she has been previously evaluated by Tacey Ruiz here in our office. She is going to be  switching to Fridays to see me due to the Wound VAC schedule being changed on Monday, Wednesday, and Friday. Subsequently she seems to be doing fairly well with the Wound VAC. Her son who was present during evaluation today states that he somewhat stresses of the Wound VAC in making sure that it was functioning appropriately. With that being said everything seems to be working well he knows what settings on the [facility] itself to look at and ensure that it is functioning properly. Overall the wound appears to be nice and clean there is no need for debridement today. She has no discomfort in her bilateral lower extremity ulcers also appear to be improving based on measurements and what this honest tell me about the overall appearance. 07/02/17 on evaluation today I noted in patients wound bed that was actually an odor that had not previously been noted. Subsequently there was also a small area of bone that was not previously noted during my last evaluation. This did appear to be necrotic and was being somewhat forced out by the body around the region of granulation. She does not have any pain which is good news. Nonetheless the overall appearance of the ulcer is making me concerned for the patient having developed osteomyelitis. Currently she is not been on any antibiotics and the Wound VAC has been doing fairly well in general. With that being said I do not think we need to continue the Wound VAC if she potentially has a bone infection. At least not until it is properly addressed with antibiotics. 07/09/17 on evaluation today patient appears to actually be doing very well in regard to her sacral ulcer compared to last week. There is actually no exposed bone at this point. 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 With that being said still I do  believe she needs to have a referral to infectious disease due to the osteomyelitis I am gonna recommend that she continue with the doxycycline based on the culture results which showed Eikenella  Corrodens as the TAKIA, RUNYON. ([id number]) organism identified that tetracycline should work for. She does not seem to be having any discomfort whatsoever at this point. 07/16/17 on evaluation today patient actually appears to be doing rather well in regard to her sacral wound. I think that the original wound site actually appears much better than previously noted. With that being said she does have a new superficial injury on the right sacral region which appears to be due to according to the sign transport that occurred for her MRI unfortunately. Fortunately this is not too deep and I do think it can be managed but it was a new area that was not previously noted. Otherwise she has been tolerating the Dakinos soaked gauze packing without complication. 07/30/17 on evaluation today patient presents for reevaluation concerning her sacral wound. She has been tolerating the dressing changes without complication. With that being said her wound is doing so well at this point that I think she may be at the point where we could reinitiate the Wound VAC currently and hopefully see good results from this. I'm very pleased with how she has responded to the Dakinos soaked gauze packing. 08/13/17 evaluation today patient appears to be doing excellent in regard to her lower extremity ulcer this seems to be cleaning up very nicely. In regard to the sacral ulcer the area of trauma on the right lateral portion of the wound actually appears to be much better than previously noted during the last evaluation. The word has definitely filled in and the Wound VAC appears to be helpful I do believe. Overall I'm very pleased with how things seem to be progressing at this time. Patient likewise is also happy that things are doing  well. 08/27/17 on evaluation today patient presents for follow-up concerning her ongoing issues with her sacral ulcer and lower extremity ulcer. Fortunately she has been tolerating the dressing changes well complication the Wound VAC in general seems to have done very well up to this point. She does not have any evidence of infection which is good news. She does have some dark discoloration in central portion of the wound which is troublesome for the possibility of pressure injury to the site it sounds like her prior air mattress was not functioning properly her son has just bought a new one for her this is doing much better. Otherwise things seem to be progressing nicely. 09/10/17 on evaluation today patient presents for follow-up concerning her sacral wound and lower extremity ulcer. She has been tolerating the switch of her dressing changes to the Dakinos soaked gauze dressing very well in regard to the sacrum. The left lateral lower extremity ulcer appears to have a new area open just distal to the one that we have been treating with Santyl and this is new since her last visit. There does not appear to be any evidence of infection which is good news. With that being said there's a lot of maceration here at the site and I feel this is likely due to the switch and the dressings it appears that due to the sticking of the dressings it will switch to utilizing a telfa pad over the Santyl. I think this caused a lot of drainage to be collected and situated in the region just under the dressing which has led to this causing which duration of breakdown. Overall there does not appear to be any significant pain which is good news. Of note I did actually have a conversation with the radiologist  who is an interventional radiologist with [facility]. 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 With that being said there does not  appear to be any rush to get this done according to the interventional radiologist whom I spoke with. Therefore after discussion it was determined that there gonna wait about six months prior to considering the procedure to get time for hopefully the sacral wound to heal in the interim to at least some degree. 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 According to nursing staff these are all improved, the negative pressure wound therapy system was put on hold last week. We will resume negative pressure wound therapy today, continue with Santyl to the lower extremity and she will follow-up next week. 09/24/17 on evaluation today unfortunately the patient's wound appears to be doing significantly worse compared to last week's evaluation and even my valuation the week before. She actually has bone noted on the left wound margin in the region of undermining unfortunately. This was not noted during the last evaluation. I am concerned about the fact that this seems to be worse not better since our last visit with her. My biggest concern is that she's likely developing a worsening osteomyelitis. This was discussed with patient and her son today during the office visit. 05/03/17 on evaluation today patient actually appears to be doing rather well in regard to her sacral ulcer compared to last week's evaluation. She has been tolerating the dressing changes without complication. Fortunately even though we're not utilizing the Wound VAC she seems to be making some strides in seeing the wound area overall improved quite dramatically in my pinion in just one weeks time. She also seems to be staying off of this to the point that I do not see any evidence of new injury which is excellent news. Whatever she's been doing in that regard over the past week I would want her to continue. I did also review the results of her bone culture which revealed group B strep as the  responsible organism. Again this is what's causing her osteomyelitis she does have infectious disease appointment scheduled for 10/11/17 10/08/1898 valuation today patient appears to be doing better for the most part in regard to the sacral wound. Overall she is showing signs of having granulation of the bone which is good news. There is an area towards the left of the wound where she does seem to be showing some signs of opening this it would be due to additional pressure to the area unfortunately. CARIGAN, LISTER. ([id number]) There does not appear to be any evidence of infection spreading which is good news that do not see any evidence of significant purulent discharge which is also good news. 10/26/17 on evaluation today patient sacral ulcer actually appears to be very healthy and doing well in regard to the overall appearance of the wound. With that being said she does seem to be having some issues at this point in time with some shear/friction injury of the sacrum from where she was transported to [facility] for her infectious disease appointment. She has been placed on IV antibiotic therapy I will have to go back and find that note for review as I did not have access to that today. Nonetheless I will add the next visit be sure to document the exact antibiotics that she is taking at this point. Nonetheless I do believe  the wound is doing better there's no bone exposure nothing that requires debridement in regard to the sacral wound. Likewise her left lateral lower extremity ulcer also appears to be doing significantly better. In general I feel like things are showing signs of improvement all around. 11/12/17 on evaluation today patient appears to be doing rather well in regard to her sacral wound. In general I do see signs of granulation at this point. Fortunately there does not appear to be any evidence of infection which is good news as well. In general overall happy with the appearance. With  that being said I'll be see I would like for this to be progressing more rapidly as with the patient and her son but nonetheless at least things do not appear to be doing worse. Her lower extremity ulcer is doing significantly better. 11/26/17 on evaluation today patient appears to be doing a little better in regard to the sacral wound especially in the peripheral of the wound bed. She actually did have an abrasion on the right gluteal region that's completely resolved to this point. Her lower extremity also appears to be for the most part completely resolved. Overall the sacrum itself seems to be the one thing that is still open and given her trouble. No fevers, chills, nausea, or vomiting noted at this time. She actually has an appointment next week with infectious disease for recheck to see how things are doing. She maybe switch to oral medications at that point. Electronic Signature(s) Signed: 11/29/2017 8:05:21 AM By: Lenda KelpStone III, Hoyt PA-C Entered By: Lenda KelpStone III, Hoyt on 11/26/2017 13:08:45 Jessica BradyVERMAN, Chea W. ([id number]) -------------------------------------------------------------------------------- Physical Exam Details Patient Name: Jessica BradyVERMAN, Bhavya W. Date of Service: 11/26/2017 11:15 AM Medical Record Number: [id number] Patient Account Number: [id number] Date of Birth/Sex: 07-05-1945 (72 y.o. F) Treating RN: Curtis Sitesorthy, Joanna Primary Care Provider: Annita BrodASENSO, PHILIP Other Clinician: Referring Provider: Annita BrodASENSO, PHILIP Treating Provider/Extender: STONE III, HOYT Weeks in Treatment: 26 Constitutional Well-nourished and well-hydrated in no acute distress. Respiratory normal breathing without difficulty. Psychiatric this patient is able to make decisions and demonstrates good insight into disease process. Alert and Oriented x 3. pleasant and cooperative. Notes At this time patient's wound bed does still show some evidence of Slough noted in the surface of the wound. There was also some bone  exposure noted at this point. With that being said my goal at this time is gonna be to try to get some new tissue to grow over this area of exposed bone. I do think that possibly adding a collagen dressing would be beneficial for her. That is underneath the Wound VAC. Electronic Signature(s) Signed: 11/29/2017 8:05:21 AM By: Lenda KelpStone III, Hoyt PA-C Entered By: Lenda KelpStone III, Hoyt on 11/26/2017 13:09:23 Jessica BradyVERMAN, Keiran W. ([id number]) -------------------------------------------------------------------------------- Physician Orders Details Patient Name: Jessica BradyVERMAN, Judie W. Date of Service: 11/26/2017 11:15 AM Medical Record Number: [id number] Patient Account Number: [id number] Date of Birth/Sex: 07-05-1945 (72 y.o. F) Treating RN: Curtis Sitesorthy, Joanna Primary Care Provider: Annita BrodASENSO, PHILIP Other Clinician: Referring Provider: Annita BrodASENSO, PHILIP Treating Provider/Extender: Linwood DibblesSTONE III, HOYT Weeks in Treatment: 7426 Verbal / Phone Orders: No Diagnosis Coding ICD-10 Coding Code Description L89.154 Pressure ulcer of sacral region, stage 4 M48.00 Spinal stenosis, site unspecified R54 Age-related physical debility G89.4 Chronic pain syndrome E78.2 Mixed hyperlipidemia L89.93 Pressure ulcer of unspecified site, stage 3 Wound Cleansing Wound #1 Medial Sacrum o Other: - please cleanse sacral wound with dakins moistened gauze - do not spray dakins on wound Wound #2 Left Lower Leg o Clean wound with Normal  Saline. Anesthetic (add to Medication List) Wound #1 Medial Sacrum o Topical Lidocaine 4% cream applied to wound bed prior to debridement (In Clinic Only). Wound #2 Left Lower Leg o Topical Lidocaine 4% cream applied to wound bed prior to debridement (In Clinic Only). Skin Barriers/Peri-Wound Care Wound #1 Medial Sacrum o Skin Prep Wound #2 Left Lower Leg o Antifungal cream Primary Wound Dressing Wound #1 Medial Sacrum o Pack wound with: - plain collagen without silver use until wound vac is  placed back on wound by [facility] Wound #2 Left Lower Leg o Other: - nystatin cream Secondary Dressing Wound #1 Medial Sacrum o ABD pad - secure with tape use until wound vac is placed back on wound Vaca, Ewa W. ([id number]) Wound #2 Left Lower Leg o Boardered Foam Dressing - or ABD pad and tape Dressing Change Frequency Wound #1 Medial Sacrum o Change Dressing Monday, Wednesday, Friday Wound #2 Left Lower Leg o Change dressing every day. Follow-up Appointments Wound #1 Medial Sacrum o Return Appointment in 2 weeks. Wound #2 Left Lower Leg o Return Appointment in 2 weeks. Off-Loading Wound #1 Medial Sacrum o Turn and reposition every 2 hours Wound #2 Left Lower Leg o Turn and reposition every 2 hours Additional Orders / Instructions Wound #1 Medial Sacrum o Increase protein intake. Wound #2 Left Lower Leg o Increase protein intake. Home Health Wound #1 Medial Sacrum o Continue Home Health Visits - Advanced HHRN to start wound vac o Home Health Nurse may visit PRN to address patientos wound care needs. o FACE TO FACE ENCOUNTER: MEDICARE and MEDICAID PATIENTS: I certify that this patient is under my care and that I had a face-to-face encounter that meets the physician face-to-face encounter requirements with this patient on this date. The encounter with the patient was in whole or in part for the following MEDICAL CONDITION: (primary reason for Home Healthcare) MEDICAL NECESSITY: I certify, that based on my findings, NURSING services are a medically necessary home health service. HOME BOUND STATUS: I certify that my clinical findings support that this patient is homebound (i.e., Due to illness or injury, pt requires aid of supportive devices such as crutches, cane, wheelchairs, walkers, the use of special transportation or the assistance of another person to leave their place of residence. There is a normal inability to leave the home and doing so  requires considerable and taxing effort. Other absences are for medical reasons / religious services and are infrequent or of short duration when for other reasons). o If current dressing causes regression in wound condition, may D/C ordered dressing product/s and apply Normal Saline Moist Dressing daily until next Wound Healing Center / Other MD appointment. Notify Wound Healing Center of regression in wound condition at [phone]. o Please direct any NON-WOUND related issues/requests for orders to patient's Primary Care Physician Wound #2 Left Lower Leg o Continue Home Health Visits - Advanced HHRN to start wound vac - please use pain collagen under NPWT foam YAZMEEN, WOOLF ([id number]) o Home Health Nurse may visit PRN to address patientos wound care needs. o FACE TO FACE ENCOUNTER: MEDICARE and MEDICAID PATIENTS: I certify that this patient is under my care and that I had a face-to-face encounter that meets the physician face-to-face encounter requirements with this patient on this date. The encounter with the patient was in whole or in part for the following MEDICAL CONDITION: (primary reason for Home Healthcare) MEDICAL NECESSITY: I certify, that based on my findings, NURSING services are a medically necessary  home health service. HOME BOUND STATUS: I certify that my clinical findings support that this patient is homebound (i.e., Due to illness or injury, pt requires aid of supportive devices such as crutches, cane, wheelchairs, walkers, the use of special transportation or the assistance of another person to leave their place of residence. There is a normal inability to leave the home and doing so requires considerable and taxing effort. Other absences are for medical reasons / religious services and are infrequent or of short duration when for other reasons). o If current dressing causes regression in wound condition, may D/C ordered dressing product/s and  apply Normal Saline Moist Dressing daily until next Wound Healing Center / Other MD appointment. Notify Wound Healing Center of regression in wound condition at [phone]. o Please direct any NON-WOUND related issues/requests for orders to patient's Primary Care Physician Negative Pressure Wound Therapy Wound #1 Medial Sacrum o Wound VAC settings at 125/130 mmHg continuous pressure. Use BLACK/GREEN foam to wound cavity. Use WHITE foam to fill any tunnel/s and/or undermining. Change VAC dressing 3 X WEEK. Change canister as indicated when full. Nurse may titrate settings and frequency of dressing changes as clinically indicated. - HHRN to start wound vac HHRN to change on Mondays, Wednesdays and every other Friday o Home Health Nurse may d/c VAC for s/s of increased infection, significant wound regression, or uncontrolled drainage. Notify [facility] at [phone]. o Number of foam/gauze pieces used in the dressing = Electronic Signature(s) Signed: 11/26/2017 5:05:24 PM By: Curtis Sites Signed: 11/29/2017 8:05:21 AM By: Lenda Kelp PA-C Entered By: Curtis Sites on 11/26/2017 12:17:04 Jessica [name] ([id number]) -------------------------------------------------------------------------------- Problem List Details [name]. Date of Service: 11/26/2017 11:15 AM Medical Record Number: [id number] Patient Account Number: [id number] Date of Birth/Sex: January 11, 1946 (72 y.o. F) Treating RN: Curtis Sites Primary Care Provider: Annita Brod Other Clinician: Referring Provider: Annita Brod Treating Provider/Extender: Linwood Dibbles, HOYT Weeks in Treatment: 26 Active Problems ICD-10 Evaluated Encounter Code Description Active Date Today Diagnosis L89.154 Pressure ulcer of sacral region, stage 4 05/27/2017 No Yes M48.00 Spinal stenosis, site unspecified 05/27/2017 No Yes R54 Age-related physical debility 05/27/2017 No Yes G89.4 Chronic pain  syndrome 05/27/2017 No Yes E78.2 Mixed hyperlipidemia 05/27/2017 No Yes L89.93 Pressure ulcer of unspecified site, stage 3 05/27/2017 No Yes Inactive Problems Resolved Problems Electronic Signature(s) Signed: 11/29/2017 8:05:21 AM By: Lenda Kelp PA-C Entered By: Lenda Kelp on 11/26/2017 11:17:02 Jessica [name] ([id number]) -------------------------------------------------------------------------------- Progress Note Details [name]. Date of Service: 11/26/2017 11:15 AM Medical Record Number: [id number] Patient Account Number: [id number] Date of Birth/Sex: 01/19/1946 (72 y.o. F) Treating RN: Curtis Sites Primary Care Provider: Annita Brod Other Clinician: Referring Provider: Annita Brod Treating Provider/Extender: Linwood Dibbles, HOYT Weeks in Treatment: 26 Subjective Chief Complaint Information obtained from Patient she is here for evaluation of a sacral ulcer and bilateral lower extremity ulcers History of Present Illness (HPI) 05/27/17-she is here in initial evaluation for a left-sided sacral stage IV pressure ulcer and bilateral lower extremity, lateral aspect, unstageable pressure ulcers. She is accompanied by her husband and her son, who are her primary caregivers. She is bedbound secondary to spinal stenosis. According to her son and husband she was hospitalized from 10/5-11/1 with healthcare associated pneumonia and altered mental status. During her hospitalization she was intubated, extubated on 10/27. She was discharged with Foley catheter and follow up with urology. According to her son and spouse she developed the sacral ulcer during hospitalization.  Home health has been applying Santyl daily. She does have a low air loss mattress and is repositioned every 2-3 hours per her family's report. According to the son and spouse she had an appointment with urology on 12/18 and during that appointment developed discoloration to her bilateral lower  extremities which ultimately developed into unstageable pressure ulcers to the lateral aspects of her bilateral lower extremities. There has been no topical treatment applied to these. She continues to have home health. There is no concerns expressed regarding dietary intake, stating she eats 3 meals a day, eating was provided; she is supplemented with boost with protein. 06/03/17-she is here in follow-up evaluation for sacral and bilateral lower extremity ulcers. Plain film x-ray done today reveals no distraction to the sacrum or coccyx, no visible abnormalities. Home health has ordered the negative pressure wound system but it has not been initiated. We will continue with Hydrofera Blue until initiation of negative pressure wound system and continue with Santyl to bilateral lower extremity ulcers. Follow-up next week 06/10/17-she is here in follow-up evaluation for sacral and bilateral lower extremity ulcers. The wound VAC will be available tomorrow per home health. We will initiate wound VAC therapy to the sacral ulcer 3 times weekly (Thursday, Saturday/Sunday, Tuesday). We will continue with Santyl to the lower extremity ulcers. The patient's son is checking into home health therapy over the weekend for [facility] changes, with the understanding that if VAC changes cannot be performed over the weekend he will need to change his mother's appointments to Monday, Wednesday or Friday. The sacral ulcer clinically does not appear infected but there has been a change in the amount of drainage acutely, there is no significant amount of devitalized tissue, there is no malodor. Wound culture was obtained to evaluate for occult infection we will hold off on antibiotic therapy until sensitivities are resulted. 06/18/17 on evaluation today patient appears to be doing fairly well in my opinion although this is the first time I have seen this patient she has been previously evaluated by Tacey Ruiz here in our office. She is  going to be switching to Fridays to see me due to the Wound VAC schedule being changed on Monday, Wednesday, and Friday. Subsequently she seems to be doing fairly well with the Wound VAC. Her son who was present during evaluation today states that he somewhat stresses of the Wound VAC in making sure that it was functioning appropriately. With that being said everything seems to be working well he knows what settings on the [facility] itself to look at and ensure that it is functioning properly. Overall the wound appears to be nice and clean there is no need for debridement today. She has no discomfort in her bilateral lower extremity ulcers also appear to be improving based on measurements and what this honest tell me about the overall appearance. 07/02/17 on evaluation today I noted in patients wound bed that was actually an odor that had not previously been noted. Subsequently there was also a small area of bone that was not previously noted during my last evaluation. This did appear to be necrotic and was being somewhat forced out by the body around the region of granulation. She does not have any pain which is good news. Nonetheless the overall appearance of the ulcer is making me concerned for the patient having developed osteomyelitis. Currently she is not been on any antibiotics and the Wound VAC has been doing fairly well in general. With that being said I do not  think we need to continue the Wound VAC if she potentially has a bone infection. At least not until it is properly addressed with antibiotics. IMAAN, PADGETT ([id number]) 07/09/17 on evaluation today patient appears to actually be doing very well in regard to her sacral ulcer compared to last week. There is actually no exposed bone at this point. Her pathology report showed early signs of osteomyelitis which had explained to the patient son is definitely good news catching this early is often a key to getting it better without things  worsening. With that being said still I do believe she needs to have a referral to infectious disease due to the osteomyelitis I am gonna recommend that she continue with the doxycycline based on the culture results which showed Eikenella Corrodens as the organism identified that tetracycline should work for. She does not seem to be having any discomfort whatsoever at this point. 07/16/17 on evaluation today patient actually appears to be doing rather well in regard to her sacral wound. I think that the original wound site actually appears much better than previously noted. With that being said she does have a new superficial injury on the right sacral region which appears to be due to according to the sign transport that occurred for her MRI unfortunately. Fortunately this is not too deep and I do think it can be managed but it was a new area that was not previously noted. Otherwise she has been tolerating the Dakin s soaked gauze packing without complication. 07/30/17 on evaluation today patient presents for reevaluation concerning her sacral wound. She has been tolerating the dressing changes without complication. With that being said her wound is doing so well at this point that I think she may be at the point where we could reinitiate the Wound VAC currently and hopefully see good results from this. I'm very pleased with how she has responded to the [facility] s soaked gauze packing. 08/13/17 evaluation today patient appears to be doing excellent in regard to her lower extremity ulcer this seems to be cleaning up very nicely. In regard to the sacral ulcer the area of trauma on the right lateral portion of the wound actually appears to be much better than previously noted during the last evaluation. The word has definitely filled in and the Wound VAC appears to be helpful I do believe. Overall I'm very pleased with how things seem to be progressing at this time. Patient likewise is also happy that things  are doing well. 08/27/17 on evaluation today patient presents for follow-up concerning her ongoing issues with her sacral ulcer and lower extremity ulcer. Fortunately she has been tolerating the dressing changes well complication the Wound VAC in general seems to have done very well up to this point. She does not have any evidence of infection which is good news. She does have some dark discoloration in central portion of the wound which is troublesome for the possibility of pressure injury to the site it sounds like her prior air mattress was not functioning properly her son has just bought a new one for her this is doing much better. Otherwise things seem to be progressing nicely. 09/10/17 on evaluation today patient presents for follow-up concerning her sacral wound and lower extremity ulcer. She has been tolerating the switch of her dressing changes to the Dakin s soaked gauze dressing very well in regard to the sacrum. The left lateral lower extremity ulcer appears to have a new area open just distal to the  one that we have been treating with Santyl and this is new since her last visit. There does not appear to be any evidence of infection which is good news. With that being said there's a lot of maceration here at the site and I feel this is likely due to the switch and the dressings it appears that due to the sticking of the dressings it will switch to utilizing a telfa pad over the Santyl. I think this caused a lot of drainage to be collected and situated in the region just under the dressing which has led to this causing which duration of breakdown. Overall there does not appear to be any significant pain which is good news. Of note I did actually have a conversation with the radiologist who is an interventional radiologist with [facility]. 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 With that being said there  does not appear to be any rush to get this done according to the interventional radiologist whom I spoke with. Therefore after discussion it was determined that there gonna wait about six months prior to considering the procedure to get time for hopefully the sacral wound to heal in the interim to at least some degree. 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 According to nursing staff these are all improved, the negative pressure wound therapy system was put on hold last week. We will resume negative pressure wound therapy today, continue with Santyl to the lower extremity and she will follow-up next week. 09/24/17 on evaluation today unfortunately the patient's wound appears to be doing significantly worse compared to last week's evaluation and even my valuation the week before. She actually has bone noted on the left wound margin in the region of undermining unfortunately. This was not noted during the last evaluation. I am concerned about the fact that this seems to be worse not better since our last visit with her. My biggest concern is that she's likely developing a worsening osteomyelitis. This was discussed with patient and her son today during the office visit. 05/03/17 on evaluation today patient actually appears to be doing rather well in regard to her sacral ulcer compared to last week's evaluation. She has been tolerating the dressing changes without complication. Fortunately even though we're not utilizing the Wound VAC she seems to be making some strides in seeing the wound area overall improved quite dramatically in my pinion in just one weeks time. She also seems to be staying off of this to the point that I do not see any evidence of new injury which is excellent news. Whatever she's been doing in that regard over the past week I would want her to continue. MIRINDA, MONTE ([id number]) did also review the results of her bone  culture which revealed group B strep as the responsible organism. Again this is what's causing her osteomyelitis she does have infectious disease appointment scheduled for 10/11/17 10/08/1898 valuation today patient appears to be doing better for the most part in regard to the sacral wound. Overall she is showing signs of having granulation of the bone which is good news. There is an area towards the left of the wound where she does seem to be showing some signs of opening this it would be due to additional pressure to the area unfortunately. There does not appear to be any evidence of infection spreading which is  good news that do not see any evidence of significant purulent discharge which is also good news. 10/26/17 on evaluation today patient sacral ulcer actually appears to be very healthy and doing well in regard to the overall appearance of the wound. With that being said she does seem to be having some issues at this point in time with some shear/friction injury of the sacrum from where she was transported to [facility] for her infectious disease appointment. She has been placed on IV antibiotic therapy I will have to go back and find that note for review as I did not have access to that today. Nonetheless I will add the next visit be sure to document the exact antibiotics that she is taking at this point. Nonetheless I do believe the wound is doing better there's no bone exposure nothing that requires debridement in regard to the sacral wound. Likewise her left lateral lower extremity ulcer also appears to be doing significantly better. In general I feel like things are showing signs of improvement all around. 11/12/17 on evaluation today patient appears to be doing rather well in regard to her sacral wound. In general I do see signs of granulation at this point. Fortunately there does not appear to be any evidence of infection which is good news as well. In general overall happy with the  appearance. With that being said I'll be see I would like for this to be progressing more rapidly as with the patient and her son but nonetheless at least things do not appear to be doing worse. Her lower extremity ulcer is doing significantly better. 11/26/17 on evaluation today patient appears to be doing a little better in regard to the sacral wound especially in the peripheral of the wound bed. She actually did have an abrasion on the right gluteal region that's completely resolved to this point. Her lower extremity also appears to be for the most part completely resolved. Overall the sacrum itself seems to be the one thing that is still open and given her trouble. No fevers, chills, nausea, or vomiting noted at this time. She actually has an appointment next week with infectious disease for recheck to see how things are doing. She maybe switch to oral medications at that point. Patient History Information obtained from Patient. Family History No family history of Cancer, Diabetes, Heart Disease. Social History Never smoker, Marital Status - Married. Review of Systems (ROS) Constitutional Symptoms (General Health) Denies complaints or symptoms of Fever, Chills. Respiratory The patient has no complaints or symptoms. Cardiovascular The patient has no complaints or symptoms. Psychiatric The patient has no complaints or symptoms. SHARILYN, GEISINGER ([id number]) Objective Constitutional Well-nourished and well-hydrated in no acute distress. Vitals Time Taken: 11:13 AM, Height: 70 in, Temperature: 98.3 F, Pulse: 70 bpm, Respiratory Rate: 18 breaths/min, Blood Pressure: 134/59 mmHg. Respiratory normal breathing without difficulty. Psychiatric this patient is able to make decisions and demonstrates good insight into disease process. Alert and Oriented x 3. pleasant and cooperative. General Notes: At this time patient's wound bed does still show some evidence of Slough noted in the  surface of the wound. There was also some bone exposure noted at this point. With that being said my goal at this time is gonna be to try to get some new tissue to grow over this area of exposed bone. I do think that possibly adding a collagen dressing would be beneficial for her. That is underneath the Wound VAC. Integumentary (Hair, Skin) Wound #1 status is Open.  Original cause of wound was Pressure Injury. The wound is located on the Medial Sacrum. The wound measures 7.3cm length x 5.3cm width x 2.2cm depth; 30.387cm^2 area and 66.852cm^3 volume. There is bone, muscle, and Fat Layer (Subcutaneous Tissue) Exposed exposed. There is undermining starting at 7:00 and ending at 12:00 with a maximum distance of 3.4cm. There is a large amount of sanguinous drainage noted. The wound margin is distinct with the outline attached to the wound base. There is medium (34-66%) red, pink, hyper - granulation within the wound bed. There is a medium (34-66%) amount of necrotic tissue within the wound bed including Eschar and Adherent Slough. The periwound skin appearance exhibited: Induration, Scarring, Rubor, Erythema. The periwound skin appearance did not exhibit: Callus, Crepitus, Excoriation, Rash, Dry/Scaly, Maceration, Atrophie Blanche, Cyanosis, Ecchymosis, Hemosiderin Staining, Mottled, Pallor. The surrounding wound skin color is noted with erythema which is circumferential. Periwound temperature was noted as No Abnormality. The periwound has tenderness on palpation. Wound #2 status is Open. Original cause of wound was Gradually Appeared. The wound is located on the Left Lower Leg. The wound measures 0.2cm length x 0.2cm width x 0.1cm depth; 0.031cm^2 area and 0.003cm^3 volume. Wound #5 status is Healed - Epithelialized. Original cause of wound was Trauma. The wound is located on the Right Gluteus. The wound measures 0cm length x 0cm width x 0cm depth; 0cm^2 area and 0cm^3 volume. Assessment Active  Problems ICD-10 Pressure ulcer of sacral region, stage 4 Spinal stenosis, site unspecified Age-related physical debility Chronic pain syndrome Mixed hyperlipidemia Pressure ulcer of unspecified site, stage 3 Meadow, Pennie W. ([id number]) Procedures Wound #1 Pre-procedure diagnosis of Wound #1 is a Pressure Ulcer located on the Medial Sacrum . There was a Excisional Skin/Subcutaneous Tissue Debridement with a total area of 4 sq cm performed by STONE III, HOYT E., PA-C. With the following instrument(s): Curette to remove Viable and Non-Viable tissue/material. Material removed includes Subcutaneous Tissue and Slough and after achieving pain control using Lidocaine 4% Topical Solution. No specimens were taken. A time out was conducted at 12:07, prior to the start of the procedure. A Minimum amount of bleeding was controlled with Pressure. The procedure was tolerated well with a pain level of 0 throughout and a pain level of 0 following the procedure. Patient s Level of Consciousness post procedure was recorded as Awake and Alert. Post Debridement Measurements: 7.3cm length x 5.3cm width x 2.3cm depth; 69.89cm^3 volume. Post debridement Stage noted as Category/Stage IV. Character of Wound/Ulcer Post Debridement is improved. Post procedure Diagnosis Wound #1: Same as Pre-Procedure Plan Wound Cleansing: Wound #1 Medial Sacrum: Other: - please cleanse sacral wound with dakins moistened gauze - do not spray dakins on wound Wound #2 Left Lower Leg: Clean wound with Normal Saline. Anesthetic (add to Medication List): Wound #1 Medial Sacrum: Topical Lidocaine 4% cream applied to wound bed prior to debridement (In Clinic Only). Wound #2 Left Lower Leg: Topical Lidocaine 4% cream applied to wound bed prior to debridement (In Clinic Only). Skin Barriers/Peri-Wound Care: Wound #1 Medial Sacrum: Skin Prep Wound #2 Left Lower Leg: Antifungal cream Primary Wound Dressing: Wound #1 Medial  Sacrum: Pack wound with: - plain collagen without silver use until wound vac is placed back on wound by [facility] Wound #2 Left Lower Leg: Other: - nystatin cream Secondary Dressing: Wound #1 Medial Sacrum: ABD pad - secure with tape use until wound vac is placed back on wound Wound #2 Left Lower Leg: Boardered Foam Dressing - or ABD pad  and tape Dressing Change Frequency: Wound #1 Medial Sacrum: Change Dressing Monday, Wednesday, Friday Wound #2 Left Lower Leg: Change dressing every day. Follow-up Appointments: Wound #1 Medial Sacrum: Return Appointment in 2 weeks. Wound #2 Left Lower Leg: NATALYIA, INNES. ([id number]) Return Appointment in 2 weeks. Off-Loading: Wound #1 Medial Sacrum: Turn and reposition every 2 hours Wound #2 Left Lower Leg: Turn and reposition every 2 hours Additional Orders / Instructions: Wound #1 Medial Sacrum: Increase protein intake. Wound #2 Left Lower Leg: Increase protein intake. Home Health: Wound #1 Medial Sacrum: Continue Home Health Visits - Advanced HHRN to start wound vac Home Health Nurse may visit PRN to address patient s wound care needs. FACE TO FACE ENCOUNTER: MEDICARE and MEDICAID PATIENTS: I certify that this patient is under my care and that I had a face-to-face encounter that meets the physician face-to-face encounter requirements with this patient on this date. The encounter with the patient was in whole or in part for the following MEDICAL CONDITION: (primary reason for Home Healthcare) MEDICAL NECESSITY: I certify, that based on my findings, NURSING services are a medically necessary home health service. HOME BOUND STATUS: I certify that my clinical findings support that this patient is homebound (i.e., Due to illness or injury, pt requires aid of supportive devices such as crutches, cane, wheelchairs, walkers, the use of special transportation or the assistance of another person to leave their place of residence. There is a normal  inability to leave the home and doing so requires considerable and taxing effort. Other absences are for medical reasons / religious services and are infrequent or of short duration when for other reasons). If current dressing causes regression in wound condition, may D/C ordered dressing product/s and apply Normal Saline Moist Dressing daily until next Wound Healing Center / Other MD appointment. Notify Wound Healing Center of regression in wound condition at [phone]. Please direct any NON-WOUND related issues/requests for orders to patient's Primary Care Physician Wound #2 Left Lower Leg: Continue Home Health Visits - Advanced HHRN to start wound vac - please use pain collagen under NPWT foam Home Health Nurse may visit PRN to address patient s wound care needs. FACE TO FACE ENCOUNTER: MEDICARE and MEDICAID PATIENTS: I certify that this patient is under my care and that I had a face-to-face encounter that meets the physician face-to-face encounter requirements with this patient on this date. The encounter with the patient was in whole or in part for the following MEDICAL CONDITION: (primary reason for Home Healthcare) MEDICAL NECESSITY: I certify, that based on my findings, NURSING services are a medically necessary home health service. HOME BOUND STATUS: I certify that my clinical findings support that this patient is homebound (i.e., Due to illness or injury, pt requires aid of supportive devices such as crutches, cane, wheelchairs, walkers, the use of special transportation or the assistance of another person to leave their place of residence. There is a normal inability to leave the home and doing so requires considerable and taxing effort. Other absences are for medical reasons / religious services and are infrequent or of short duration when for other reasons). If current dressing causes regression in wound condition, may D/C ordered dressing product/s and apply Normal Saline Moist  Dressing daily until next Wound Healing Center / Other MD appointment. Notify Wound Healing Center of regression in wound condition at [phone]. Please direct any NON-WOUND related issues/requests for orders to patient's Primary Care Physician Negative Pressure Wound Therapy: Wound #1 Medial Sacrum: Wound VAC  settings at 125/130 mmHg continuous pressure. Use BLACK/GREEN foam to wound cavity. Use WHITE foam to fill any tunnel/s and/or undermining. Change VAC dressing 3 X WEEK. Change canister as indicated when full. Nurse may titrate settings and frequency of dressing changes as clinically indicated. - HHRN to start wound vac HHRN to change on Mondays, Wednesdays and every other Friday Home Health Nurse may d/c VAC for s/s of increased infection, significant wound regression, or uncontrolled drainage. Notify Wound Healing Center at [phone]. Number of foam/gauze pieces used in the dressing = At this point in time I'm gonna suggest that we see the patient back for reevaluation in two weeks time to see were things stand. She's in agreement with plan as is her son. If anything changes or worsens they will let us know. Otherwise my hope is that she continues to make some progress in regard to granulation and filling in regard to her sacral wound. BRITNE, BORELLI ([id number]) Please see above for specific wound care orders. We will see patient for re-evaluation in 2 week(s) here in the clinic. If anything worsens or changes patient will contact our office for additional recommendations. Electronic Signature(s) Signed: 11/29/2017 8:05:21 AM By: Lenda Kelp PA-C Entered By: Lenda Kelp on 11/26/2017 13:10:17 Jessica [name] ([id number]) -------------------------------------------------------------------------------- ROS/PFSH Details [name]. Date of Service: 11/26/2017 11:15 AM Medical Record Number: [id number] Patient Account Number: [id number] Date of  Birth/Sex: [birth year] (72 y.o. F) Treating RN: Curtis Sites Primary Care Provider: Annita Brod Other Clinician: Referring Provider: Annita Brod Treating Provider/Extender: STONE III, HOYT Weeks in Treatment: 26 Information Obtained From Patient Wound History Constitutional Symptoms (General Health) Complaints and Symptoms: Negative for: Fever; Chills Respiratory Complaints and Symptoms: No Complaints or Symptoms Cardiovascular Complaints and Symptoms: No Complaints or Symptoms Psychiatric Complaints and Symptoms: No Complaints or Symptoms Immunizations Pneumococcal Vaccine: Received Pneumococcal Vaccination: No Implantable Devices Family and Social History Cancer: No; Diabetes: No; Heart Disease: No; Never smoker; Marital Status - Married Physician Affirmation I have reviewed and agree with the above information. Electronic Signature(s) Signed: 11/26/2017 5:05:24 PM By: Curtis Sites Signed: 11/29/2017 8:05:21 AM By: Lenda Kelp PA-C Entered By: Lenda Kelp on 11/26/2017 13:09:03 Jessica [name] ([id number]) -------------------------------------------------------------------------------- SuperBill Details [name]. Date of Service: 11/26/2017 Medical Record Number: [id number] Patient Account Number: [id number] Date of Birth/Sex: Sep 28, 1945 (72 y.o. F) Treating RN: Curtis Sites Primary Care Provider: Annita Brod Other Clinician: Referring Provider: Annita Brod Treating Provider/Extender: STONE III, HOYT Weeks in Treatment: 26 Diagnosis Coding ICD-10 Codes Code Description L89.154 Pressure ulcer of sacral region, stage 4 M48.00 Spinal stenosis, site unspecified R54 Age-related physical debility G89.4 Chronic pain syndrome E78.2 Mixed hyperlipidemia L89.93 Pressure ulcer of unspecified site, stage 3 Facility Procedures CPT4 Code: 36644034 Description: 11042 - DEB SUBQ TISSUE 20 SQ CM/< ICD-10 Diagnosis Description  L89.154 Pressure ulcer of sacral region, stage 4 Modifier: Quantity: 1 Physician Procedures CPT4 Code: [id number] Description: 11042 - WC PHYS SUBQ TISS 20 SQ CM ICD-10 Diagnosis Description L89.154 Pressure ulcer of sacral region, stage 4 Modifier: Quantity: 1 Electronic Signature(s) Signed: 11/29/2017 8:05:21 AM By: Lenda Kelp PA-C Entered By: Lenda Kelp on 11/26/2017 13:10:25

## 2017-12-10 ENCOUNTER — Ambulatory Visit: Payer: Self-pay | Admitting: Physician Assistant

## 2017-12-24 ENCOUNTER — Encounter: Payer: Medicare Other | Attending: Physician Assistant | Admitting: Physician Assistant

## 2017-12-24 DIAGNOSIS — Z7401 Bed confinement status: Secondary | ICD-10-CM | POA: Diagnosis not present

## 2017-12-24 DIAGNOSIS — L89154 Pressure ulcer of sacral region, stage 4: Secondary | ICD-10-CM | POA: Insufficient documentation

## 2017-12-24 DIAGNOSIS — L97909 Non-pressure chronic ulcer of unspecified part of unspecified lower leg with unspecified severity: Secondary | ICD-10-CM | POA: Diagnosis present

## 2017-12-24 DIAGNOSIS — E782 Mixed hyperlipidemia: Secondary | ICD-10-CM | POA: Insufficient documentation

## 2017-12-24 DIAGNOSIS — G894 Chronic pain syndrome: Secondary | ICD-10-CM | POA: Diagnosis not present

## 2017-12-28 ENCOUNTER — Telehealth: Payer: Self-pay | Admitting: Radiology

## 2017-12-28 NOTE — Telephone Encounter (Addendum)
Phoned patient's son, [name], to review info re:  Scheduling of Biopsy and thermal ablation of Left Renal Mass.  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  He shared that, athough the patient is still being treated at wound care and has a wound vac, she is currently off all antibiotics and there is no evidence of infection.  He feels that she is at decreased risk of infection at present.  Dr Oley Balmaniel Hassell notified with this update and he is in agreement that the patient may be scheduled for treatment.    [name] states that the family is in agreement.  The information has been [facility] to [facility] to coordinate/schedule.  Yana Schorr Carmell AustriaGales, RN 12/28/2017 9:47 AM

## 2017-12-29 NOTE — Progress Notes (Signed)
Jessica [name], [name] ([id number]) Visit Report for 12/24/2017 Arrival Information Details [name], [name] Date of Service: 12/24/2017 1:30 PM Medical Record Number: [id number] Patient Account Number: [id number] Date of Birth/Sex: [birth year] (72 y.o. F) Treating RN: Curtis Sitesorthy, Joanna Primary Care Claudean Leavelle: Annita BrodASENSO, PHILIP Other Clinician: Referring Teagyn Fishel: Annita BrodASENSO, PHILIP Treating Trishna Cwik/Extender: Linwood DibblesSTONE III, HOYT Weeks in Treatment: 30 Visit Information History Since Last Visit Added or deleted any medications: No Patient Arrived: Stretcher Any new allergies or adverse reactions: No Arrival Time: 12:59 Had a fall or experienced change in No Accompanied By: son. EMS activities of daily living that may affect Transfer Assistance: Stretcher risk of falls: Patient Identification Verified: Yes Signs or symptoms of abuse/neglect since last visito No Secondary Verification Process Yes Hospitalized since last visit: No Completed: Implantable device outside of the clinic excluding No Patient Has Alerts: Yes cellular tissue based products placed in the center Patient Alerts: ALLERGIC TO since last visit: ZINC Has Dressing in Place as Prescribed: Yes Pain Present Now: No Electronic Signature(s) Signed: 12/24/2017 4:56:18 PM By: Dayton MartesWallace, RCP,RRT,CHT, Sallie RCP, RRT, CHT Entered By: Dayton MartesWallace, RCP,RRT,CHT, Sallie on 12/24/2017 13:00:28 Jessica [name], [name] ([id number]) -------------------------------------------------------------------------------- Encounter Discharge Information Details [name], [name] Date of Service: 12/24/2017 1:30 PM Medical Record Number: [id number] Patient Account Number: [id number] Date of Birth/Sex: [birth year] (72 y.o. F) Treating RN: Renne CriglerFlinchum, Cheryl Primary Care Kailynne Ferrington: Annita BrodASENSO, PHILIP Other Clinician: Referring Alvin Rubano: Annita BrodASENSO, PHILIP Treating Jancie Kercher/Extender: Linwood DibblesSTONE III, HOYT Weeks in Treatment: 30 Encounter Discharge  Information Items Discharge Condition: Stable Ambulatory Status: Stretcher Discharge Destination: Home Transportation: Ambulance Accompanied By: son Schedule Follow-up Appointment: Yes Clinical Summary of Care: Electronic Signature(s) Signed: 12/24/2017 2:37:04 PM By: Renne CriglerFlinchum, Cheryl Entered By: Renne CriglerFlinchum, Cheryl on 12/24/2017 14:37:04 Jessica [name], [name] ([id number]) -------------------------------------------------------------------------------- Lower Extremity Assessment Details [name], [name] Date of Service: 12/24/2017 1:30 PM Medical Record Number: [id number] Patient Account Number: [id number] Date of Birth/Sex: [birth year] (72 y.o. F) Treating RN: Rema JasmineNg, Wendi Primary Care Maree Ainley: Annita BrodASENSO, PHILIP Other Clinician: Referring Josepha Barbier: Annita BrodASENSO, PHILIP Treating Lincy Belles/Extender: Linwood DibblesSTONE III, HOYT Weeks in Treatment: 30 Electronic Signature(s) Signed: 12/24/2017 3:36:43 PM By: Rema JasmineNg, Wendi Entered By: Rema JasmineNg, Wendi on 12/24/2017 13:14:35 Jessica [name], [name] ([id number]) -------------------------------------------------------------------------------- Multi Wound Chart Details [name], [name] Date of Service: 12/24/2017 1:30 PM Medical Record Number: [id number] Patient Account Number: [id number] Date of Birth/Sex: [birth year] (72 y.o. F) Treating RN: Curtis Sitesorthy, Joanna Primary Care Tayson Schnelle: Annita BrodASENSO, PHILIP Other Clinician: Referring Abdulwahab Demelo: Annita BrodASENSO, PHILIP Treating Odessie Polzin/Extender: STONE III, HOYT Weeks in Treatment: 30 Vital Signs Height(in): 70 Pulse(bpm): 77 Weight(lbs): Blood Pressure(mmHg): 127/64 Body Mass Index(BMI): Temperature(F): 98.4 Respiratory Rate 16 (breaths/min): Photos: [N/A:N/A] Wound Location: Sacrum - Medial Left Lower Leg N/A Wounding Event: Pressure Injury Gradually Appeared N/A Primary Etiology: Pressure Ulcer Pressure Ulcer N/A Date Acquired: 01/15/2017 04/28/2017 N/A Weeks of Treatment: 30 30 N/A Wound Status: Open Open  N/A Measurements L x W x D 8x4.8x3.2 0.1x0.1x0.1 N/A (cm) Area (cm) : 30.159 0.008 N/A Volume (cm) : 96.51 0.001 N/A % Reduction in Area: 18.20% 98.30% N/A % Reduction in Volume: -161.90% 98.90% N/A Starting Position 1 8 (o'clock): Ending Position 1 12 (o'clock): Maximum Distance 1 (cm): 3.1 Undermining: Yes No N/A Classification: Category/Stage IV Category/Stage III N/A Exudate Amount: Large None Present N/A Exudate Type: Serosanguineous N/A N/A Exudate Color: red, brown N/A N/A Wound Margin: Distinct, outline attached Flat and Intact N/A Granulation Amount: Medium (34-66%) None Present (0%) N/A Granulation Quality: Red, Pink, Hyper-granulation N/A N/A Necrotic Amount: Medium (34-66%) None Present (0%)  N/A Necrotic Tissue: Eschar, Adherent Slough N/A N/A Exposed Structures: Fat Layer (Subcutaneous Fascia: No N/A Tissue) Exposed: Yes Fat Layer (Subcutaneous [name], [name] Muscle: Yes Tissue) Exposed: No Bone: Yes Tendon: No Fascia: No Muscle: No Tendon: No Joint: No Joint: No Bone: No Epithelialization: None Medium (34-66%) N/A Periwound Skin Texture: Induration: Yes Scarring: Yes N/A Scarring: Yes Excoriation: No Excoriation: No Induration: No Callus: No Callus: No Crepitus: No Crepitus: No Rash: No Rash: No Periwound Skin Moisture: Maceration: No Maceration: No N/A Dry/Scaly: No Dry/Scaly: No Periwound Skin Color: Erythema: Yes Erythema: Yes N/A Rubor: Yes Atrophie Blanche: No Atrophie Blanche: No Cyanosis: No Cyanosis: No Ecchymosis: No Ecchymosis: No Hemosiderin Staining: No Hemosiderin Staining: No Mottled: No Mottled: No Pallor: No Pallor: No Rubor: No Erythema Location: Circumferential Circumferential N/A Temperature: No Abnormality No Abnormality N/A Tenderness on Palpation: Yes Yes N/A Wound Preparation: Ulcer Cleansing: Ulcer Cleansing: N/A Rinsed/Irrigated with Saline Rinsed/Irrigated with Saline Topical  Anesthetic Applied: Topical Anesthetic Applied: Other: lidocaine 4% Other: lidocaine 4% Treatment Notes Electronic Signature(s) Signed: 12/27/2017 4:11:20 PM By: Curtis Sites Entered By: Curtis Sites on 12/24/2017 14:17:31 Jessica [name] ([id number]) -------------------------------------------------------------------------------- Multi-Disciplinary Care Plan Details [name]. Date of Service: 12/24/2017 1:30 PM Medical Record Number: [id number] Patient Account Number: [id number] Date of Birth/Sex: March 30, 1946 (72 y.o. F) Treating RN: Curtis Sites Primary Care Ellington Greenslade: Annita Brod Other Clinician: Referring Jenelle Drennon: Annita Brod Treating Jody Silas/Extender: STONE III, HOYT Weeks in Treatment: 30 Active Inactive ` Orientation to the Wound Care Program Nursing Diagnoses: Knowledge deficit related to the wound healing center program Goals: Patient/caregiver will verbalize understanding of the Wound Healing Center Program Date Initiated: 06/03/2017 Target Resolution Date: 06/17/2017 Goal Status: Active Interventions: Provide education on orientation to the wound center Notes: ` Pressure Nursing Diagnoses: Knowledge deficit related to causes and risk factors for pressure ulcer development Knowledge deficit related to management of pressures ulcers Potential for impaired tissue integrity related to pressure, friction, moisture, and shear Goals: Patient will remain free from development of additional pressure ulcers Date Initiated: 06/03/2017 Target Resolution Date: 06/17/2017 Goal Status: Active Patient will remain free of pressure ulcers Date Initiated: 06/03/2017 Target Resolution Date: 06/17/2017 Goal Status: Active Patient/caregiver will verbalize risk factors for pressure ulcer development Date Initiated: 06/03/2017 Target Resolution Date: 06/17/2017 Goal Status: Active Interventions: Assess: immobility, friction, shearing, incontinence upon  admission and as needed Assess potential for pressure ulcer upon admission and as needed Notes: ` Wound/Skin Impairment LINSY, EHRESMAN ([id number]) Nursing Diagnoses: Impaired tissue integrity Goals: Patient/caregiver will verbalize understanding of skin care regimen Date Initiated: 06/03/2017 Target Resolution Date: 06/17/2017 Goal Status: Active Ulcer/skin breakdown will have a volume reduction of 30% by week 4 Date Initiated: 06/03/2017 Target Resolution Date: 06/17/2017 Goal Status: Active Interventions: Assess ulceration(s) every visit Treatment Activities: Patient referred to home care : 06/03/2017 Skin care regimen initiated : 06/03/2017 Notes: Electronic Signature(s) Signed: 12/27/2017 4:11:20 PM By: Curtis Sites Entered By: Curtis Sites on 12/24/2017 14:17:14 Jessica [name] ([id number]) -------------------------------------------------------------------------------- Pain Assessment Details [name]. Date of Service: 12/24/2017 1:30 PM Medical Record Number: [id number] Patient Account Number: [id number] Date of Birth/Sex: 05-Nov-1945 (72 y.o. F) Treating RN: Curtis Sites Primary Care Samyuktha Brau: Annita Brod Other Clinician: Referring [place]: Annita Brod Treating Jove Beyl/Extender: STONE III, HOYT Weeks in Treatment: 30 Active Problems Location of Pain Severity and Description of Pain Patient Has Paino No Site Locations Pain Management and Medication Current Pain Management: Electronic Signature(s) Signed: 12/24/2017 4:56:18 PM By: Dayton Martes  RCP, RRT, CHT Signed: 12/27/2017 4:11:20 PM By: Curtis Sites Entered By: Dayton Martes on 12/24/2017 13:00:40 Jessica [name] ([id number]) -------------------------------------------------------------------------------- Patient/Caregiver Education Details [name]. Date of Service: 12/24/2017 1:30 PM Medical Record Number:  [id number] Patient Account Number: [id number] Date of Birth/Gender: 11-Nov-1945 (72 y.o. F) Treating RN: Renne Crigler Primary Care Physician: Annita Brod Other Clinician: Referring Physician: Annita Brod Treating Physician/Extender: Linwood Dibbles, HOYT Weeks in Treatment: 30 Education Assessment Education Provided To: Patient Education Topics Provided Wound Debridement: Handouts: Wound Debridement Methods: Explain/Verbal Responses: State content correctly Wound/Skin Impairment: Handouts: Caring for Your Ulcer Methods: Explain/Verbal Responses: State content correctly Electronic Signature(s) Signed: 12/24/2017 5:59:54 PM By: Renne Crigler Entered By: Renne Crigler on 12/24/2017 14:37:24 Jessica [name] ([id number]) -------------------------------------------------------------------------------- Wound Assessment Details [name]. Date of Service: 12/24/2017 1:30 PM Medical Record Number: [id number] Patient Account Number: [id number] Date of Birth/Sex: May 16, 1945 (72 y.o. F) Treating RN: Rema Jasmine Primary Care Velicia Dejager: Annita Brod Other Clinician: Referring Ryoma Nofziger: Annita Brod Treating Timoteo Carreiro/Extender: STONE III, HOYT Weeks in Treatment: 30 Wound Status Wound Number: 1 Primary Etiology: Pressure Ulcer Wound Location: Sacrum - Medial Wound Status: Open Wounding Event: Pressure Injury Date Acquired: 01/15/2017 Weeks Of Treatment: 30 Clustered Wound: No Photos Photo Uploaded By: Rema Jasmine on 12/24/2017 13:33:25 Wound Measurements Length: (cm) 8 % Reductio Width: (cm) 4.8 % Reductio Depth: (cm) 3.2 Epithelial Area: (cm) 30.159 Tunneling Volume: (cm) 96.51 Undermini Startin Ending Maximum n in Area: 18.2% n in Volume: -161.9% ization: None : No ng: Yes g Position (o'clock): 8 Position (o'clock): 12 Distance: (cm) 3.1 Wound Description Classification: Category/Stage IV Foul Odor Wound Margin: Distinct, outline  attached Slough/Fib Exudate Amount: Large Exudate Type: Serosanguineous Exudate Color: red, brown After Cleansing: No rino Yes Wound Bed Granulation Amount: Medium (34-66%) Exposed Structure Granulation Quality: Red, Pink, Hyper-granulation Fascia Exposed: No Necrotic Amount: Medium (34-66%) Fat Layer (Subcutaneous Tissue) Exposed: Yes Necrotic Quality: Eschar, Adherent Slough Tendon Exposed: No Muscle Exposed: Yes PRIYA, MATSEN. ([id number]) Necrosis of Muscle: No Joint Exposed: No Bone Exposed: Yes Periwound Skin Texture Texture Color No Abnormalities Noted: No No Abnormalities Noted: No Callus: No Atrophie Blanche: No Crepitus: No Cyanosis: No Excoriation: No Ecchymosis: No Induration: Yes Erythema: Yes Rash: No Erythema Location: Circumferential Scarring: Yes Hemosiderin Staining: No Mottled: No Moisture Pallor: No No Abnormalities Noted: No Rubor: Yes Dry / Scaly: No Maceration: No Temperature / Pain Temperature: No Abnormality Tenderness on Palpation: Yes Wound Preparation Ulcer Cleansing: Rinsed/Irrigated with Saline Topical Anesthetic Applied: Other: lidocaine 4%, Treatment Notes Wound #1 (Medial Sacrum) 1. Cleansed with: Clean wound with Normal Saline 2. Anesthetic Topical Lidocaine 4% cream to wound bed prior to debridement 4. Dressing Applied: Prisma Ag 5. Secondary Dressing Applied ABD Pad Dry Gauze Notes HHRN to replace wound vac once patient returns home Electronic Signature(s) Signed: 12/24/2017 3:36:43 PM By: Rema Jasmine Entered By: Rema Jasmine on 12/24/2017 13:27:28 Jessica [name] ([id number]) -------------------------------------------------------------------------------- Wound Assessment Details [name]. Date of Service: 12/24/2017 1:30 PM Medical Record Number: [id number] Patient Account Number: [id number] Date of Birth/Sex: 1945-12-22 (72 y.o. F) Treating RN: Curtis Sites Primary Care Dreshawn Hendershott:  Annita Brod Other Clinician: Referring Labrandon Knoch: Annita Brod Treating Jordy Hewins/Extender: STONE III, HOYT Weeks in Treatment: 30 Wound Status Wound Number: 2 Primary Etiology: Pressure Ulcer Wound Location: Left Lower Leg Wound Status: Healed - Epithelialized Wounding Event: Gradually Appeared Date Acquired: 04/28/2017 Weeks Of Treatment: 30 Clustered Wound: No Photos Photo Uploaded By: Rema Jasmine on 12/24/2017  13:33:26 Wound Measurements Length: (cm) 0 % Red Width: (cm) 0 % Red Depth: (cm) 0 Epith Area: (cm) 0 Tunn Volume: (cm) 0 Unde uction in Area: 100% uction in Volume: 100% elialization: Medium (34-66%) eling: No rmining: No Wound Description Classification: Category/Stage III Foul Wound Margin: Flat and Intact Sloug Exudate Amount: None Present Odor After Cleansing: No h/Fibrino Yes Wound Bed Granulation Amount: None Present (0%) Exposed Structure Necrotic Amount: None Present (0%) Fascia Exposed: No Fat Layer (Subcutaneous Tissue) Exposed: No Tendon Exposed: No Muscle Exposed: No Joint Exposed: No Bone Exposed: No Periwound Skin Texture Texture Color No Abnormalities Noted: No No Abnormalities Noted: No CHELSEE, HOSIE. ([id number]) Callus: No Atrophie Blanche: No Crepitus: No Cyanosis: No Excoriation: No Ecchymosis: No Induration: No Erythema: Yes Rash: No Erythema Location: Circumferential Scarring: Yes Hemosiderin Staining: No Mottled: No Moisture Pallor: No No Abnormalities Noted: No Rubor: No Dry / Scaly: No Maceration: No Temperature / Pain Temperature: No Abnormality Tenderness on Palpation: Yes Wound Preparation Ulcer Cleansing: Rinsed/Irrigated with Saline Topical Anesthetic Applied: Other: lidocaine 4%, Electronic Signature(s) Signed: 12/27/2017 4:11:20 PM By: Curtis Sites Entered By: Curtis Sites on 12/24/2017 14:22:58 Jessica [name]  ([id number]) -------------------------------------------------------------------------------- Vitals Details [name]. Date of Service: 12/24/2017 1:30 PM Medical Record Number: [id number] Patient Account Number: [id number] Date of Birth/Sex: Dec 10, 1945 (72 y.o. F) Treating RN: Curtis Sites Primary Care Rulon Abdalla: Annita Brod Other Clinician: Referring Jovonte Commins: Annita Brod Treating Tashia Leiterman/Extender: STONE III, HOYT Weeks in Treatment: 30 Vital Signs Time Taken: 13:00 Temperature (F): 98.4 Height (in): 70 Pulse (bpm): 77 Respiratory Rate (breaths/min): 16 Blood Pressure (mmHg): 127/64 Reference Range: 80 - 120 mg / dl Electronic Signature(s) Signed: 12/24/2017 4:56:18 PM By: Dayton Martes RCP, RRT, CHT Entered By: Dayton Martes on 12/24/2017 13:01:34

## 2017-12-29 NOTE — Progress Notes (Signed)
Jessica, [name] ([id number]) Visit Report for 12/24/2017 Chief Complaint Document Details [name], Jessica [name]. Date of Service: 12/24/2017 1:30 PM Medical Record Number: [id number] Patient Account Number: [id number] Date of Birth/Sex: Mar 23, 1946 (72 y.o. F) Treating RN: Curtis Sites Primary Care Provider: Annita Brod Other Clinician: Referring Provider: Annita Brod Treating Provider/Extender: Linwood Dibbles, HOYT Weeks in Treatment: 30 Information Obtained from: Patient Chief Complaint she is here for evaluation of a sacral ulcer and bilateral lower extremity ulcers Electronic Signature(s) Signed: 12/24/2017 10:43:54 PM By: Lenda Kelp PA-C Entered By: Lenda Kelp on 12/24/2017 13:49:05 Jessica [name] ([id number]) -------------------------------------------------------------------------------- Debridement Details [name]. Date of Service: 12/24/2017 1:30 PM Medical Record Number: [id number] Patient Account Number: [id number] Date of Birth/Sex: 1946-01-31 (72 y.o. F) Treating RN: Curtis Sites Primary Care Provider: Annita Brod Other Clinician: Referring Provider: Annita Brod Treating Provider/Extender: STONE III, HOYT Weeks in Treatment: 30 Debridement Performed for Wound #1 Medial Sacrum Assessment: Performed By: Physician STONE III, HOYT E., PA-C Debridement Type: Debridement Pre-procedure Verification/Time Yes - 14:18 Out Taken: Start Time: 14:18 Pain Control: Lidocaine 4% Topical Solution Total Area Debrided (L x W): 8 (cm) x 4.8 (cm) = 38.4 (cm) Tissue and other material Viable, Non-Viable, Slough, Subcutaneous, Biofilm, Slough debrided: Level: Skin/Subcutaneous Tissue Debridement Description: Excisional Instrument: Curette Bleeding: Minimum Hemostasis Achieved: Pressure End Time: 14:22 Procedural Pain: 0 Post Procedural Pain: 0 Response to Treatment: Procedure was tolerated well Level of Consciousness:  Awake and Alert Post Debridement Measurements of Total Wound Length: (cm) 8 Stage: Category/Stage IV Width: (cm) 4.8 Depth: (cm) 3.3 Volume: (cm) 99.526 Character of Wound/Ulcer Post Improved Debridement: Post Procedure Diagnosis Same as Pre-procedure Electronic Signature(s) Signed: 12/24/2017 10:43:54 PM By: Lenda Kelp PA-C Signed: 12/27/2017 4:11:20 PM By: Curtis Sites Entered By: Curtis Sites on 12/24/2017 14:22:46 Jessica [name] ([id number]) -------------------------------------------------------------------------------- HPI Details [name]. Date of Service: 12/24/2017 1:30 PM Medical Record Number: [id number] Patient Account Number: [id number] Date of Birth/Sex: 1946/03/28 (72 y.o. F) Treating RN: Curtis Sites Primary Care Provider: Annita Brod Other Clinician: Referring Provider: Annita Brod Treating Provider/Extender: Linwood Dibbles, HOYT Weeks in Treatment: 30 History of Present Illness HPI Description: 05/27/17-she is here in initial evaluation for a left-sided sacral stage IV pressure ulcer and bilateral lower extremity, lateral aspect, unstageable pressure ulcers. She is accompanied by her husband and her son, who are her primary caregivers. She is bedbound secondary to spinal stenosis. According to her son and husband she was hospitalized from 10/5-11/1 with healthcare associated pneumonia and altered mental status. During her hospitalization she was intubated, extubated on 10/27. She was discharged with Foley catheter and follow up with urology. According to her son and spouse she developed the sacral ulcer during hospitalization. Home health has been applying Santyl daily. She does have a low air loss mattress and is repositioned every 2-3 hours per her family's report. According to the son and spouse she had an appointment with urology on 12/18 and during that appointment developed discoloration to her bilateral lower  extremities which ultimately developed into unstageable pressure ulcers to the lateral aspects of her bilateral lower extremities. There has been no topical treatment applied to these. She continues to have home health. There is no concerns expressed regarding dietary intake, stating she eats 3 meals a day, eating was provided; she is supplemented with boost with protein. 06/03/17-she is here in follow-up evaluation for sacral and bilateral lower extremity ulcers. Plain film x-ray done today reveals no distraction  to the sacrum or coccyx, no visible abnormalities. Home health has ordered the negative pressure wound system but it has not been initiated. We will continue with Hydrofera Blue until initiation of negative pressure wound system and continue with Santyl to bilateral lower extremity ulcers. Follow-up next week 06/10/17-she is here in follow-up evaluation for sacral and bilateral lower extremity ulcers. The wound VAC will be available tomorrow per home health. We will initiate wound VAC therapy to the sacral ulcer 3 times weekly (Thursday, Saturday/Sunday, Tuesday). We will continue with Santyl to the lower extremity ulcers. The patient's son is checking into home health therapy over the weekend for [facility] changes, with the understanding that if VAC changes cannot be performed over the weekend he will need to change his mother's appointments to Monday, Wednesday or Friday. The sacral ulcer clinically does not appear infected but there has been a change in the amount of drainage acutely, there is no significant amount of devitalized tissue, there is no malodor. Wound culture was obtained to evaluate for occult infection we will hold off on antibiotic therapy until sensitivities are resulted. 06/18/17 on evaluation today patient appears to be doing fairly well in my opinion although this is the first time I have seen this patient she has been previously evaluated by Tacey Ruiz here in our office. She is  going to be switching to Fridays to see me due to the Wound VAC schedule being changed on Monday, Wednesday, and Friday. Subsequently she seems to be doing fairly well with the Wound VAC. Her son who was present during evaluation today states that he somewhat stresses of the Wound VAC in making sure that it was functioning appropriately. With that being said everything seems to be working well he knows what settings on the [facility] itself to look at and ensure that it is functioning properly. Overall the wound appears to be nice and clean there is no need for debridement today. She has no discomfort in her bilateral lower extremity ulcers also appear to be improving based on measurements and what this honest tell me about the overall appearance. 07/02/17 on evaluation today I noted in patients wound bed that was actually an odor that had not previously been noted. Subsequently there was also a small area of bone that was not previously noted during my last evaluation. This did appear to be necrotic and was being somewhat forced out by the body around the region of granulation. She does not have any pain which is good news. Nonetheless the overall appearance of the ulcer is making me concerned for the patient having developed osteomyelitis. Currently she is not been on any antibiotics and the Wound VAC has been doing fairly well in general. With that being said I do not think we need to continue the Wound VAC if she potentially has a bone infection. At least not until it is properly addressed with antibiotics. 07/09/17 on evaluation today patient appears to actually be doing very well in regard to her sacral ulcer compared to last week. There is actually no exposed bone at this point. 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 With that being said  still I do believe she needs to have a referral to infectious disease due to the osteomyelitis I am gonna recommend that she continue with the doxycycline based on the culture results which showed  Eikenella Corrodens as the MAYANA, IRIGOYEN. ([id number]) organism identified that tetracycline should work for. She does not seem to be having any discomfort whatsoever at this point. 07/16/17 on evaluation today patient actually appears to be doing rather well in regard to her sacral wound. I think that the original wound site actually appears much better than previously noted. With that being said she does have a new superficial injury on the right sacral region which appears to be due to according to the sign transport that occurred for her MRI unfortunately. Fortunately this is not too deep and I do think it can be managed but it was a new area that was not previously noted. Otherwise she has been tolerating the Dakinos soaked gauze packing without complication. 07/30/17 on evaluation today patient presents for reevaluation concerning her sacral wound. She has been tolerating the dressing changes without complication. With that being said her wound is doing so well at this point that I think she may be at the point where we could reinitiate the Wound VAC currently and hopefully see good results from this. I'm very pleased with how she has responded to the Dakinos soaked gauze packing. 08/13/17 evaluation today patient appears to be doing excellent in regard to her lower extremity ulcer this seems to be cleaning up very nicely. In regard to the sacral ulcer the area of trauma on the right lateral portion of the wound actually appears to be much better than previously noted during the last evaluation. The word has definitely filled in and the Wound VAC appears to be helpful I do believe. Overall I'm very pleased with how things seem to be progressing at this time. Patient likewise is also happy that things  are doing well. 08/27/17 on evaluation today patient presents for follow-up concerning her ongoing issues with her sacral ulcer and lower extremity ulcer. Fortunately she has been tolerating the dressing changes well complication the Wound VAC in general seems to have done very well up to this point. She does not have any evidence of infection which is good news. She does have some dark discoloration in central portion of the wound which is troublesome for the possibility of pressure injury to the site it sounds like her prior air mattress was not functioning properly her son has just bought a new one for her this is doing much better. Otherwise things seem to be progressing nicely. 09/10/17 on evaluation today patient presents for follow-up concerning her sacral wound and lower extremity ulcer. She has been tolerating the switch of her dressing changes to the Dakinos soaked gauze dressing very well in regard to the sacrum. The left lateral lower extremity ulcer appears to have a new area open just distal to the one that we have been treating with Santyl and this is new since her last visit. There does not appear to be any evidence of infection which is good news. With that being said there's a lot of maceration here at the site and I feel this is likely due to the switch and the dressings it appears that due to the sticking of the dressings it will switch to utilizing a telfa pad over the Santyl. I think this caused a lot of drainage to be collected and situated in the region just under the dressing which has led to this causing which duration of breakdown. Overall there does not appear to be any significant pain which is good news. Of note I did actually have a conversation with the  radiologist who is an interventional radiologist with [facility]. 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 With that being said there  does not appear to be any rush to get this done according to the interventional radiologist whom I spoke with. Therefore after discussion it was determined that there gonna wait about six months prior to considering the procedure to get time for hopefully the sacral wound to heal in the interim to at least some degree. 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 According to nursing staff these are all improved, the negative pressure wound therapy system was put on hold last week. We will resume negative pressure wound therapy today, continue with Santyl to the lower extremity and she will follow-up next week. 09/24/17 on evaluation today unfortunately the patient's wound appears to be doing significantly worse compared to last week's evaluation and even my valuation the week before. She actually has bone noted on the left wound margin in the region of undermining unfortunately. This was not noted during the last evaluation. I am concerned about the fact that this seems to be worse not better since our last visit with her. My biggest concern is that she's likely developing a worsening osteomyelitis. This was discussed with patient and her son today during the office visit. 05/03/17 on evaluation today patient actually appears to be doing rather well in regard to her sacral ulcer compared to last week's evaluation. She has been tolerating the dressing changes without complication. Fortunately even though we're not utilizing the Wound VAC she seems to be making some strides in seeing the wound area overall improved quite dramatically in my pinion in just one weeks time. She also seems to be staying off of this to the point that I do not see any evidence of new injury which is excellent news. Whatever she's been doing in that regard over the past week I would want her to continue. I did also review the results of her bone culture which revealed group B strep  as the responsible organism. Again this is what's causing her osteomyelitis she does have infectious disease appointment scheduled for 10/11/17 10/08/1898 valuation today patient appears to be doing better for the most part in regard to the sacral wound. Overall she is showing signs of having granulation of the bone which is good news. There is an area towards the left of the wound where she does seem to be showing some signs of opening this it would be due to additional pressure to the area unfortunately. NATHALY, DAWKINS. ([id number]) There does not appear to be any evidence of infection spreading which is good news that do not see any evidence of significant purulent discharge which is also good news. 10/26/17 on evaluation today patient sacral ulcer actually appears to be very healthy and doing well in regard to the overall appearance of the wound. With that being said she does seem to be having some issues at this point in time with some shear/friction injury of the sacrum from where she was transported to [facility] for her infectious disease appointment. She has been placed on IV antibiotic therapy I will have to go back and find that note for review as I did not have access to that today. Nonetheless I will add the next visit be sure to document the exact antibiotics that she is taking at this point. Nonetheless I do  believe the wound is doing better there's no bone exposure nothing that requires debridement in regard to the sacral wound. Likewise her left lateral lower extremity ulcer also appears to be doing significantly better. In general I feel like things are showing signs of improvement all around. 11/12/17 on evaluation today patient appears to be doing rather well in regard to her sacral wound. In general I do see signs of granulation at this point. Fortunately there does not appear to be any evidence of infection which is good news as well. In general overall happy with the appearance.  With that being said I'll be see I would like for this to be progressing more rapidly as with the patient and her son but nonetheless at least things do not appear to be doing worse. Her lower extremity ulcer is doing significantly better. 11/26/17 on evaluation today patient appears to be doing a little better in regard to the sacral wound especially in the peripheral of the wound bed. She actually did have an abrasion on the right gluteal region that's completely resolved to this point. Her lower extremity also appears to be for the most part completely resolved. Overall the sacrum itself seems to be the one thing that is still open and given her trouble. No fevers, chills, nausea, or vomiting noted at this time. She actually has an appointment next week with infectious disease for recheck to see how things are doing. She maybe switch to oral medications at that point. 12/09/17 on evaluation today patient actually appears to be doing much better in regard to her sacral wound. I do feel like she has made good progress at this time as far as healing is concerned. In fact the wound looks better to me today than it has in quite some time. She is on oral antibiotic therapy and no longer on the IV antibiotics. She did see infectious disease last week. 12/24/17 on evaluation today patient's wounds actually appear to be doing decently well in regard to the sacral wound in particular. When it comes to her lower extremity ulcers both appear to be completely healed at this point in the one area where she had lived the rash has completely resolved at this time. Overall very pleased with the progress that has been made. Nonetheless the patient seems to be doing well in general. She still does have quite a bit of healing to do in regard to the sacral wound but I feel like she is making progress. Electronic Signature(s) Signed: 12/24/2017 10:43:54 PM By: Lenda KelpStone III, Hoyt PA-C Entered By: Lenda KelpStone III, Hoyt on  12/24/2017 15:25:15 Jessica BradyVERMAN, Tiona W. ([id number]) -------------------------------------------------------------------------------- Physical Exam Details Patient Name: Jessica BradyVERMAN, Queen W. Date of Service: 12/24/2017 1:30 PM Medical Record Number: [id number] Patient Account Number: [id number] Date of Birth/Sex: Jul 28, 1945 (72 y.o. F) Treating RN: Curtis Sitesorthy, Joanna Primary Care Provider: Annita BrodASENSO, PHILIP Other Clinician: Referring Provider: Annita BrodASENSO, PHILIP Treating Provider/Extender: STONE III, HOYT Weeks in Treatment: 30 Constitutional Well-nourished and well-hydrated in no acute distress. Respiratory normal breathing without difficulty. Psychiatric this patient is able to make decisions and demonstrates good insight into disease process. Alert and Oriented x 3. pleasant and cooperative. Notes On inspection today the patient actually show signs of good improvement in regard to the ulcerations in the sacral region and again her lower extremities are both completely healed at this point. Overall I'm very pleased with how things have gone. There were some issues with getting the Wound VAC to seal this last time apparently there will wasn't  leaking this was causing some trouble. Nonetheless hopefully this will be managed better today when this is reapplied it did beep a lot last night until finally the patient son took the Wound VAC often just pack this with Dakin's until they came in today. Electronic Signature(s) Signed: 12/24/2017 10:43:54 PM By: Lenda Kelp PA-C Entered By: Lenda Kelp on 12/24/2017 15:26:02 Jessica [name] ([id number]) -------------------------------------------------------------------------------- Physician Orders Details [name]. Date of Service: 12/24/2017 1:30 PM Medical Record Number: [id number] Patient Account Number: [id number] Date of Birth/Sex: 1945-09-20 (72 y.o. F) Treating RN: Curtis Sites Primary Care Provider: Annita Brod Other Clinician: Referring Provider: Annita Brod Treating Provider/Extender: STONE III, HOYT Weeks in Treatment: 30 Verbal / Phone Orders: No Diagnosis Coding ICD-10 Coding Code Description L89.154 Pressure ulcer of sacral region, stage 4 M48.00 Spinal stenosis, site unspecified R54 Age-related physical debility G89.4 Chronic pain syndrome E78.2 Mixed hyperlipidemia L89.93 Pressure ulcer of unspecified site, stage 3 Wound Cleansing Wound #1 Medial Sacrum o Other: - please cleanse sacral wound with dakins moistened gauze - do not spray dakins on wound Anesthetic (add to Medication List) Wound #1 Medial Sacrum o Topical Lidocaine 4% cream applied to wound bed prior to debridement (In Clinic Only). Skin Barriers/Peri-Wound Care Wound #1 Medial Sacrum o Skin Prep Primary Wound Dressing Wound #1 Medial Sacrum o Pack wound with: - plain collagen without silver - use under wound vac Secondary Dressing Wound #1 Medial Sacrum o ABD pad - secure with tape use until wound vac is placed back on wound Dressing Change Frequency Wound #1 Medial Sacrum o Change Dressing Monday, Wednesday, Friday Follow-up Appointments Wound #1 Medial Sacrum o Return Appointment in 2 weeks. Off-Loading Wound #1 Medial Sacrum SOHA, THORUP. ([id number]) o Turn and reposition every 2 hours Additional Orders / Instructions Wound #1 Medial Sacrum o Increase protein intake. Home Health Wound #1 Medial Sacrum o Continue Home Health Visits - Advanced HHRN to start wound vac o Home Health Nurse may visit PRN to address patientos wound care needs. o FACE TO FACE ENCOUNTER: MEDICARE and MEDICAID PATIENTS: I certify that this patient is under my care and that I had a face-to-face encounter that meets the physician face-to-face encounter requirements with this patient on this date. The encounter with the patient was in whole or in part for the following MEDICAL CONDITION:  (primary reason for Home Healthcare) MEDICAL NECESSITY: I certify, that based on my findings, NURSING services are a medically necessary home health service. HOME BOUND STATUS: I certify that my clinical findings support that this patient is homebound (i.e., Due to illness or injury, pt requires aid of supportive devices such as crutches, cane, wheelchairs, walkers, the use of special transportation or the assistance of another person to leave their place of residence. There is a normal inability to leave the home and doing so requires considerable and taxing effort. Other absences are for medical reasons / religious services and are infrequent or of short duration when for other reasons). o If current dressing causes regression in wound condition, may D/C ordered dressing product/s and apply Normal Saline Moist Dressing daily until next Wound Healing Center / Other MD appointment. Notify Wound Healing Center of regression in wound condition at [phone]. o Please direct any NON-WOUND related issues/requests for orders to patient's Primary Care Physician Negative Pressure Wound Therapy Wound #1 Medial Sacrum o Wound VAC settings at 125/130 mmHg continuous pressure. Use BLACK/GREEN foam to wound cavity. Use WHITE foam to fill any  tunnel/s and/or undermining. Change VAC dressing 3 X WEEK. Change canister as indicated when full. Nurse may titrate settings and frequency of dressing changes as clinically indicated. - HHRN to start wound vac HHRN to change on Mondays, Wednesdays and every other Friday o Home Health Nurse may d/c VAC for s/s of increased infection, significant wound regression, or uncontrolled drainage. Notify Wound Healing Center at [phone]. o Number of foam/gauze pieces used in the dressing = Electronic Signature(s) Signed: 12/24/2017 10:43:54 PM By: Lenda Kelp PA-C Signed: 12/27/2017 4:11:20 PM By: Curtis Sites Entered By: Curtis Sites on 12/24/2017  14:25:13 Jessica [name] ([id number]) -------------------------------------------------------------------------------- Problem List Details [name]. Date of Service: 12/24/2017 1:30 PM Medical Record Number: [id number] Patient Account Number: [id number] Date of Birth/Sex: Nov 12, 1945 (72 y.o. F) Treating RN: Curtis Sites Primary Care Provider: Annita Brod Other Clinician: Referring Provider: Annita Brod Treating Provider/Extender: Linwood Dibbles, HOYT Weeks in Treatment: 30 Active Problems ICD-10 Evaluated Encounter Code Description Active Date Today Diagnosis L89.154 Pressure ulcer of sacral region, stage 4 05/27/2017 No Yes M48.00 Spinal stenosis, site unspecified 05/27/2017 No Yes R54 Age-related physical debility 05/27/2017 No Yes G89.4 Chronic pain syndrome 05/27/2017 No Yes E78.2 Mixed hyperlipidemia 05/27/2017 No Yes L89.93 Pressure ulcer of unspecified site, stage 3 05/27/2017 No Yes Inactive Problems Resolved Problems Electronic Signature(s) Signed: 12/24/2017 10:43:54 PM By: Lenda Kelp PA-C Entered By: Lenda Kelp on 12/24/2017 13:48:57 Jessica [name] ([id number]) -------------------------------------------------------------------------------- Progress Note Details [name]. Date of Service: 12/24/2017 1:30 PM Medical Record Number: [id number] Patient Account Number: [id number] Date of Birth/Sex: May 18, 1945 (73 y.o. F) Treating RN: Curtis Sites Primary Care Provider: Annita Brod Other Clinician: Referring Provider: Annita Brod Treating Provider/Extender: Linwood Dibbles, HOYT Weeks in Treatment: 30 Subjective Chief Complaint Information obtained from Patient she is here for evaluation of a sacral ulcer and bilateral lower extremity ulcers History of Present Illness (HPI) 05/27/17-she is here in initial evaluation for a left-sided sacral stage IV pressure ulcer and bilateral lower extremity, lateral aspect,  unstageable pressure ulcers. She is accompanied by her husband and her son, who are her primary caregivers. She is bedbound secondary to spinal stenosis. According to her son and husband she was hospitalized from 10/5-11/1 with healthcare associated pneumonia and altered mental status. During her hospitalization she was intubated, extubated on 10/27. She was discharged with Foley catheter and follow up with urology. According to her son and spouse she developed the sacral ulcer during hospitalization. Home health has been applying Santyl daily. She does have a low air loss mattress and is repositioned every 2-3 hours per her family's report. According to the son and spouse she had an appointment with urology on 12/18 and during that appointment developed discoloration to her bilateral lower extremities which ultimately developed into unstageable pressure ulcers to the lateral aspects of her bilateral lower extremities. There has been no topical treatment applied to these. She continues to have home health. There is no concerns expressed regarding dietary intake, stating she eats 3 meals a day, eating was provided; she is supplemented with boost with protein. 06/03/17-she is here in follow-up evaluation for sacral and bilateral lower extremity ulcers. Plain film x-ray done today reveals no distraction to the sacrum or coccyx, no visible abnormalities. Home health has ordered the negative pressure wound system but it has not been initiated. We will continue with Hydrofera Blue until initiation of negative pressure wound system and continue with Santyl to bilateral lower extremity ulcers. Follow-up next  week 06/10/17-she is here in follow-up evaluation for sacral and bilateral lower extremity ulcers. The wound VAC will be available tomorrow per home health. We will initiate wound VAC therapy to the sacral ulcer 3 times weekly (Thursday, Saturday/Sunday, Tuesday). We will continue with Santyl to the lower  extremity ulcers. The patient's son is checking into home health therapy over the weekend for [facility] changes, with the understanding that if VAC changes cannot be performed over the weekend he will need to change his mother's appointments to Monday, Wednesday or Friday. The sacral ulcer clinically does not appear infected but there has been a change in the amount of drainage acutely, there is no significant amount of devitalized tissue, there is no malodor. Wound culture was obtained to evaluate for occult infection we will hold off on antibiotic therapy until sensitivities are resulted. 06/18/17 on evaluation today patient appears to be doing fairly well in my opinion although this is the first time I have seen this patient she has been previously evaluated by Tacey Ruiz here in our office. She is going to be switching to Fridays to see me due to the Wound VAC schedule being changed on Monday, Wednesday, and Friday. Subsequently she seems to be doing fairly well with the Wound VAC. Her son who was present during evaluation today states that he somewhat stresses of the Wound VAC in making sure that it was functioning appropriately. With that being said everything seems to be working well he knows what settings on the [facility] itself to look at and ensure that it is functioning properly. Overall the wound appears to be nice and clean there is no need for debridement today. She has no discomfort in her bilateral lower extremity ulcers also appear to be improving based on measurements and what this honest tell me about the overall appearance. 07/02/17 on evaluation today I noted in patients wound bed that was actually an odor that had not previously been noted. Subsequently there was also a small area of bone that was not previously noted during my last evaluation. This did appear to be necrotic and was being somewhat forced out by the body around the region of granulation. She does not have any pain which is good  news. Nonetheless the overall appearance of the ulcer is making me concerned for the patient having developed osteomyelitis. Currently she is not been on any antibiotics and the Wound VAC has been doing fairly well in general. With that being said I do not think we need to continue the Wound VAC if she potentially has a bone infection. At least not until it is properly addressed with antibiotics. ARTASIA, THANG ([id number]) 07/09/17 on evaluation today patient appears to actually be doing very well in regard to her sacral ulcer compared to last week. There is actually no exposed bone at this point. 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 With that being said still I do believe she needs to have a referral to infectious disease due to the osteomyelitis I am gonna recommend that she continue with the doxycycline based on the culture results which showed Eikenella Corrodens as the organism identified that tetracycline should work for. She does not seem to be having any discomfort whatsoever at this point. 07/16/17 on evaluation today patient actually appears to be doing rather well in regard to her sacral wound. I  think that the original wound site actually appears much better than previously noted. With that being said she does have a new superficial injury on the right sacral region which appears to be due to according to the sign transport that occurred for her MRI unfortunately. Fortunately this is not too deep and I do think it can be managed but it was a new area that was not previously noted. Otherwise she has been tolerating the Dakin s soaked gauze packing without complication. 07/30/17 on evaluation today patient presents for reevaluation concerning her sacral wound. She has been tolerating the dressing changes without complication.  With that being said her wound is doing so well at this point that I think she may be at the point where we could reinitiate the Wound VAC currently and hopefully see good results from this. I'm very pleased with how she has responded to the [facility] s soaked gauze packing. 08/13/17 evaluation today patient appears to be doing excellent in regard to her lower extremity ulcer this seems to be cleaning up very nicely. In regard to the sacral ulcer the area of trauma on the right lateral portion of the wound actually appears to be much better than previously noted during the last evaluation. The word has definitely filled in and the Wound VAC appears to be helpful I do believe. Overall I'm very pleased with how things seem to be progressing at this time. Patient likewise is also happy that things are doing well. 08/27/17 on evaluation today patient presents for follow-up concerning her ongoing issues with her sacral ulcer and lower extremity ulcer. Fortunately she has been tolerating the dressing changes well complication the Wound VAC in general seems to have done very well up to this point. She does not have any evidence of infection which is good news. She does have some dark discoloration in central portion of the wound which is troublesome for the possibility of pressure injury to the site it sounds like her prior air mattress was not functioning properly her son has just bought a new one for her this is doing much better. Otherwise things seem to be progressing nicely. 09/10/17 on evaluation today patient presents for follow-up concerning her sacral wound and lower extremity ulcer. She has been tolerating the switch of her dressing changes to the Dakin s soaked gauze dressing very well in regard to the sacrum. The left lateral lower extremity ulcer appears to have a new area open just distal to the one that we have been treating with Santyl and this is new since her last visit. There does not appear to  be any evidence of infection which is good news. With that being said there's a lot of maceration here at the site and I feel this is likely due to the switch and the dressings it appears that due to the sticking of the dressings it will switch to utilizing a telfa pad over the Santyl. I think this caused a lot of drainage to be collected and situated in the region just under the dressing which has led to this causing which duration of breakdown. Overall there does not appear to be any significant pain which is good news. Of note I did actually have a conversation with the radiologist who is an interventional radiologist with [facility]. 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 With that being said there does not appear to be any rush to get  this done according to the interventional radiologist whom I spoke with. Therefore after discussion it was determined that there gonna wait about six months prior to considering the procedure to get time for hopefully the sacral wound to heal in the interim to at least some degree. 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 According to nursing staff these are all improved, the negative pressure wound therapy system was put on hold last week. We will resume negative pressure wound therapy today, continue with Santyl to the lower extremity and she will follow-up next week. 09/24/17 on evaluation today unfortunately the patient's wound appears to be doing significantly worse compared to last week's evaluation and even my valuation the week before. She actually has bone noted on the left wound margin in the region of undermining unfortunately. This was not noted during the last evaluation. I am concerned about the fact that this seems to be worse not better since our last visit with her. My biggest concern is that she's likely developing  a worsening osteomyelitis. This was discussed with patient and her son today during the office visit. 05/03/17 on evaluation today patient actually appears to be doing rather well in regard to her sacral ulcer compared to last week's evaluation. She has been tolerating the dressing changes without complication. Fortunately even though we're not utilizing the Wound VAC she seems to be making some strides in seeing the wound area overall improved quite dramatically in my pinion in just one weeks time. She also seems to be staying off of this to the point that I do not see any evidence of new injury which is excellent news. Whatever she's been doing in that regard over the past week I would want her to continue. YUKARI, FLAX ([id number]) did also review the results of her bone culture which revealed group B strep as the responsible organism. Again this is what's causing her osteomyelitis she does have infectious disease appointment scheduled for 10/11/17 10/08/1898 valuation today patient appears to be doing better for the most part in regard to the sacral wound. Overall she is showing signs of having granulation of the bone which is good news. There is an area towards the left of the wound where she does seem to be showing some signs of opening this it would be due to additional pressure to the area unfortunately. There does not appear to be any evidence of infection spreading which is good news that do not see any evidence of significant purulent discharge which is also good news. 10/26/17 on evaluation today patient sacral ulcer actually appears to be very healthy and doing well in regard to the overall appearance of the wound. With that being said she does seem to be having some issues at this point in time with some shear/friction injury of the sacrum from where she was transported to [facility] for her infectious disease appointment. She has been placed on IV antibiotic therapy I will have to  go back and find that note for review as I did not have access to that today. Nonetheless I will add the next visit be sure to document the exact antibiotics that she is taking at this point. Nonetheless I do believe the wound is doing better there's no bone exposure nothing that requires debridement in regard to the sacral wound. Likewise her left lateral lower extremity ulcer also appears to be doing significantly better. In general I feel like things are showing signs  of improvement all around. 11/12/17 on evaluation today patient appears to be doing rather well in regard to her sacral wound. In general I do see signs of granulation at this point. Fortunately there does not appear to be any evidence of infection which is good news as well. In general overall happy with the appearance. With that being said I'll be see I would like for this to be progressing more rapidly as with the patient and her son but nonetheless at least things do not appear to be doing worse. Her lower extremity ulcer is doing significantly better. 11/26/17 on evaluation today patient appears to be doing a little better in regard to the sacral wound especially in the peripheral of the wound bed. She actually did have an abrasion on the right gluteal region that's completely resolved to this point. Her lower extremity also appears to be for the most part completely resolved. Overall the sacrum itself seems to be the one thing that is still open and given her trouble. No fevers, chills, nausea, or vomiting noted at this time. She actually has an appointment next week with infectious disease for recheck to see how things are doing. She maybe switch to oral medications at that point. 12/09/17 on evaluation today patient actually appears to be doing much better in regard to her sacral wound. I do feel like she has made good progress at this time as far as healing is concerned. In fact the wound looks better to me today than it has  in quite some time. She is on oral antibiotic therapy and no longer on the IV antibiotics. She did see infectious disease last week. 12/24/17 on evaluation today patient's wounds actually appear to be doing decently well in regard to the sacral wound in particular. When it comes to her lower extremity ulcers both appear to be completely healed at this point in the one area where she had lived the rash has completely resolved at this time. Overall very pleased with the progress that has been made. Nonetheless the patient seems to be doing well in general. She still does have quite a bit of healing to do in regard to the sacral wound but I feel like she is making progress. Patient History Information obtained from Patient. Family History No family history of Cancer, Diabetes, Heart Disease. Social History Never smoker, Marital Status - Married. Review of Systems (ROS) Constitutional Symptoms (General Health) Denies complaints or symptoms of Fever. Respiratory The patient has no complaints or symptoms. Cardiovascular The patient has no complaints or symptoms. Psychiatric DOLORIS, SERVANTES ([id number]) The patient has no complaints or symptoms. Objective Constitutional Well-nourished and well-hydrated in no acute distress. Vitals Time Taken: 1:00 PM, Height: 70 in, Temperature: 98.4 F, Pulse: 77 bpm, Respiratory Rate: 16 breaths/min, Blood Pressure: 127/64 mmHg. Respiratory normal breathing without difficulty. Psychiatric this patient is able to make decisions and demonstrates good insight into disease process. Alert and Oriented x 3. pleasant and cooperative. General Notes: On inspection today the patient actually show signs of good improvement in regard to the ulcerations in the sacral region and again her lower extremities are both completely healed at this point. Overall I'm very pleased with how things have gone. There were some issues with getting the Wound VAC to seal this  last time apparently there will wasn't leaking this was causing some trouble. Nonetheless hopefully this will be managed better today when this is reapplied it did beep a lot last night until finally the patient son  took the Wound VAC often just pack this with Dakin's until they came in today. Integumentary (Hair, Skin) Wound #1 status is Open. Original cause of wound was Pressure Injury. The wound is located on the Medial Sacrum. The wound measures 8cm length x 4.8cm width x 3.2cm depth; 30.159cm^2 area and 96.51cm^3 volume. There is bone, muscle, and Fat Layer (Subcutaneous Tissue) Exposed exposed. There is no tunneling noted, however, there is undermining starting at 8:00 and ending at 12:00 with a maximum distance of 3.1cm. There is a large amount of serosanguineous drainage noted. The wound margin is distinct with the outline attached to the wound base. There is medium (34-66%) red, pink, hyper - granulation within the wound bed. There is a medium (34-66%) amount of necrotic tissue within the wound bed including Eschar and Adherent Slough. The periwound skin appearance exhibited: Induration, Scarring, Rubor, Erythema. The periwound skin appearance did not exhibit: Callus, Crepitus, Excoriation, Rash, Dry/Scaly, Maceration, Atrophie Blanche, Cyanosis, Ecchymosis, Hemosiderin Staining, Mottled, Pallor. The surrounding wound skin color is noted with erythema which is circumferential. Periwound temperature was noted as No Abnormality. The periwound has tenderness on palpation. Wound #2 status is Healed - Epithelialized. Original cause of wound was Gradually Appeared. The wound is located on the Left Lower Leg. The wound measures 0cm length x 0cm width x 0cm depth; 0cm^2 area and 0cm^3 volume. There is no tunneling or undermining noted. There is a none present amount of drainage noted. The wound margin is flat and intact. There is no granulation within the wound bed. There is no necrotic tissue  within the wound bed. The periwound skin appearance exhibited: Scarring, Erythema. The periwound skin appearance did not exhibit: Callus, Crepitus, Excoriation, Induration, Rash, Dry/Scaly, Maceration, Atrophie Blanche, Cyanosis, Ecchymosis, Hemosiderin Staining, Mottled, Pallor, Rubor. The surrounding wound skin color is noted with erythema which is circumferential. Periwound temperature was noted as No Abnormality. The periwound has tenderness on palpation. MADAILEIN, LONDO ([id number]) Assessment Active Problems ICD-10 Pressure ulcer of sacral region, stage 4 Spinal stenosis, site unspecified Age-related physical debility Chronic pain syndrome Mixed hyperlipidemia Pressure ulcer of unspecified site, stage 3 Procedures Wound #1 Pre-procedure diagnosis of Wound #1 is a Pressure Ulcer located on the Medial Sacrum . There was a Excisional Skin/Subcutaneous Tissue Debridement with a total area of 38.4 sq cm performed by STONE III, HOYT E., PA-C. With the following instrument(s): Curette to remove Viable and Non-Viable tissue/material. Material removed includes Subcutaneous Tissue, Slough, and Biofilm after achieving pain control using Lidocaine 4% Topical Solution. No specimens were taken. A time out was conducted at 14:18, prior to the start of the procedure. A Minimum amount of bleeding was controlled with Pressure. The procedure was tolerated well with a pain level of 0 throughout and a pain level of 0 following the procedure. Patient s Level of Consciousness post procedure was recorded as Awake and Alert. Post Debridement Measurements: 8cm length x 4.8cm width x 3.3cm depth; 99.526cm^3 volume. Post debridement Stage noted as Category/Stage IV. Character of Wound/Ulcer Post Debridement is improved. Post procedure Diagnosis Wound #1: Same as Pre-Procedure Plan Wound Cleansing: Wound #1 Medial Sacrum: Other: - please cleanse sacral wound with dakins moistened gauze - do not spray  dakins on wound Anesthetic (add to Medication List): Wound #1 Medial Sacrum: Topical Lidocaine 4% cream applied to wound bed prior to debridement (In Clinic Only). Skin Barriers/Peri-Wound Care: Wound #1 Medial Sacrum: Skin Prep Primary Wound Dressing: Wound #1 Medial Sacrum: Pack wound with: - plain collagen without silver -  use under wound vac Secondary Dressing: Wound #1 Medial Sacrum: ABD pad - secure with tape use until wound vac is placed back on wound Dressing Change Frequency: Wound #1 Medial Sacrum: Change Dressing Monday, Wednesday, Friday Follow-up Appointments: Wound #1 Medial Sacrum: DALIS, BEERS ([id number]) Return Appointment in 2 weeks. Off-Loading: Wound #1 Medial Sacrum: Turn and reposition every 2 hours Additional Orders / Instructions: Wound #1 Medial Sacrum: Increase protein intake. Home Health: Wound #1 Medial Sacrum: Continue Home Health Visits - Advanced HHRN to start wound vac Home Health Nurse may visit PRN to address patient s wound care needs. FACE TO FACE ENCOUNTER: MEDICARE and MEDICAID PATIENTS: I certify that this patient is under my care and that I had a face-to-face encounter that meets the physician face-to-face encounter requirements with this patient on this date. The encounter with the patient was in whole or in part for the following MEDICAL CONDITION: (primary reason for Home Healthcare) MEDICAL NECESSITY: I certify, that based on my findings, NURSING services are a medically necessary home health service. HOME BOUND STATUS: I certify that my clinical findings support that this patient is homebound (i.e., Due to illness or injury, pt requires aid of supportive devices such as crutches, cane, wheelchairs, walkers, the use of special transportation or the assistance of another person to leave their place of residence. There is a normal inability to leave the home and doing so requires considerable and taxing effort. Other absences are  for medical reasons / religious services and are infrequent or of short duration when for other reasons). If current dressing causes regression in wound condition, may D/C ordered dressing product/s and apply Normal Saline Moist Dressing daily until next Wound Healing Center / Other MD appointment. Notify Wound Healing Center of regression in wound condition at [phone]. Please direct any NON-WOUND related issues/requests for orders to patient's Primary Care Physician Negative Pressure Wound Therapy: Wound #1 Medial Sacrum: Wound VAC settings at 125/130 mmHg continuous pressure. Use BLACK/GREEN foam to wound cavity. Use WHITE foam to fill any tunnel/s and/or undermining. Change VAC dressing 3 X WEEK. Change canister as indicated when full. Nurse may titrate settings and frequency of dressing changes as clinically indicated. - HHRN to start wound vac HHRN to change on Mondays, Wednesdays and every other Friday Home Health Nurse may d/c VAC for s/s of increased infection, significant wound regression, or uncontrolled drainage. Notify Wound Healing Center at [phone]. Number of foam/gauze pieces used in the dressing = At this point my suggestion is gonna be that we continue with the above wound care measures for the next week. The patient is in agreement with the plan. We will subsequently see were things stand at follow-up. Please see above for specific wound care orders. We will see patient for re-evaluation in 2 week(s) here in the clinic. If anything worsens or changes patient will contact our office for additional recommendations. Electronic Signature(s) Signed: 12/24/2017 10:43:54 PM By: Lenda Kelp PA-C Entered By: Lenda Kelp on 12/24/2017 15:26:27 Jessica [name] ([id number]) -------------------------------------------------------------------------------- ROS/PFSH Details [name]. Date of Service: 12/24/2017 1:30 PM Medical Record Number:  [id number] Patient Account Number: [id number] Date of Birth/Sex: 09/12/1945 (72 y.o. F) Treating RN: Curtis Sites Primary Care Provider: Annita Brod Other Clinician: Referring Provider: Annita Brod Treating Provider/Extender: STONE III, HOYT Weeks in Treatment: 30 Information Obtained From Patient Wound History Constitutional Symptoms (General Health) Complaints and Symptoms: Negative for: Fever Respiratory Complaints and Symptoms: No Complaints or Symptoms Cardiovascular Complaints and  Symptoms: No Complaints or Symptoms Psychiatric Complaints and Symptoms: No Complaints or Symptoms Immunizations Pneumococcal Vaccine: Received Pneumococcal Vaccination: No Implantable Devices Family and Social History Cancer: No; Diabetes: No; Heart Disease: No; Never smoker; Marital Status - Married Physician Affirmation I have reviewed and agree with the above information. Electronic Signature(s) Signed: 12/24/2017 10:43:54 PM By: Lenda Kelp PA-C Signed: 12/27/2017 4:11:20 PM By: Curtis Sites Entered By: Lenda Kelp on 12/24/2017 15:25:39 Jessica [name] ([id number]) -------------------------------------------------------------------------------- SuperBill Details [name]. Date of Service: 12/24/2017 Medical Record Number: [id number] Patient Account Number: [id number] Date of Birth/Sex: 02-20-1946 (72 y.o. F) Treating RN: Curtis Sites Primary Care Provider: Annita Brod Other Clinician: Referring Provider: Annita Brod Treating Provider/Extender: STONE III, HOYT Weeks in Treatment: 30 Diagnosis Coding ICD-10 Codes Code Description L89.154 Pressure ulcer of sacral region, stage 4 M48.00 Spinal stenosis, site unspecified R54 Age-related physical debility G89.4 Chronic pain syndrome E78.2 Mixed hyperlipidemia L89.93 Pressure ulcer of unspecified site, stage 3 Facility Procedures CPT4 Code: [id number] Description: 11042 - DEB SUBQ  TISSUE 20 SQ CM/< ICD-10 Diagnosis Description L89.154 Pressure ulcer of sacral region, stage 4 Modifier: Quantity: 1 CPT4 Code: [id number] Description: 11045 - DEB SUBQ TISS EA ADDL 20CM ICD-10 Diagnosis Description L89.154 Pressure ulcer of sacral region, stage 4 Modifier: Quantity: 1 Physician Procedures CPT4 Code: [id number] Description: 11042 - WC PHYS SUBQ TISS 20 SQ CM ICD-10 Diagnosis Description L89.154 Pressure ulcer of sacral region, stage 4 Modifier: Quantity: 1 CPT4 Code: [id number] Description: 11045 - WC PHYS SUBQ TISS EA ADDL 20 CM ICD-10 Diagnosis Description L89.154 Pressure ulcer of sacral region, stage 4 Modifier: Quantity: 1 Electronic Signature(s) Signed: 12/24/2017 10:43:54 PM By: Lenda Kelp PA-C Entered By: Lenda Kelp on 12/24/2017 15:26:38

## 2017-12-30 ENCOUNTER — Telehealth (HOSPITAL_COMMUNITY): Payer: Self-pay | Admitting: Interventional Radiology

## 2017-12-30 NOTE — Telephone Encounter (Signed)
-----   [facility] sent at 12/28/2017  1:42 PM EDT ----- Regarding: Schedule Thermal Ablation and Biopsy Interventional Radiology Procedure Request  Name: Jessica [name] 1945/07/07  Procedure: Thermal Ablation and Biopsy  Reason for procedure: Left Renal Mass  Relevant History: In epic  Order Physician:Dr. Deanne CofferHassell  Office Contact:Me Mcr and Mcd no precert req - Son is [name] ##pt coming by EMS has wound vac## Thank you

## 2018-01-03 ENCOUNTER — Ambulatory Visit: Payer: Self-pay | Admitting: Infectious Diseases

## 2018-01-07 ENCOUNTER — Encounter: Payer: Medicare Other | Admitting: Physician Assistant

## 2018-01-07 ENCOUNTER — Other Ambulatory Visit (HOSPITAL_COMMUNITY): Payer: Self-pay | Admitting: Interventional Radiology

## 2018-01-07 DIAGNOSIS — N2889 Other specified disorders of kidney and ureter: Secondary | ICD-10-CM

## 2018-01-07 DIAGNOSIS — L89154 Pressure ulcer of sacral region, stage 4: Secondary | ICD-10-CM | POA: Diagnosis not present

## 2018-01-09 NOTE — Progress Notes (Signed)
Jessica [name], Jessica [name] ([id number]) Visit Report for 01/07/2018 Chief Complaint Document Details [name], Jessica [name]. Date of Service: 01/07/2018 2:45 PM Medical Record Number: [id number] Patient Account Number: [id number] Date of Birth/Sex: January 08, 1946 (72 y.o. F) Treating RN: Curtis Sites Primary Care Provider: Annita Brod Other Clinician: Referring Provider: Annita Brod Treating Provider/Extender: STONE III, Tyrian Peart Weeks in Treatment: 32 Information Obtained from: Patient Chief Complaint she is here for evaluation of a sacral ulcer and bilateral lower extremity ulcers Electronic Signature(s) Signed: 01/07/2018 5:13:23 PM By: Lenda Kelp PA-C Entered By: Lenda Kelp on 01/07/2018 14:47:58 Jessica [name] ([id number]) -------------------------------------------------------------------------------- HPI Details [name]. Date of Service: 01/07/2018 2:45 PM Medical Record Number: [id number] Patient Account Number: [id number] Date of Birth/Sex: [birth year] (72 y.o. F) Treating RN: Curtis Sites Primary Care Provider: Annita Brod Other Clinician: Referring Provider: Annita Brod Treating Provider/Extender: Linwood Dibbles, Kaiya Boatman Weeks in Treatment: 32 History of Present Illness HPI Description: 05/27/17-she is here in initial evaluation for a left-sided sacral stage IV pressure ulcer and bilateral lower extremity, lateral aspect, unstageable pressure ulcers. She is accompanied by her husband and her son, who are her primary caregivers. She is bedbound secondary to spinal stenosis. According to her son and husband she was hospitalized from 10/5-11/1 with healthcare associated pneumonia and altered mental status. During her hospitalization she was intubated, extubated on 10/27. She was discharged with Foley catheter and follow up with urology. According to her son and spouse she developed the sacral ulcer during hospitalization. Home health has been  applying Santyl daily. She does have a low air loss mattress and is repositioned every 2-3 hours per her family's report. According to the son and spouse she had an appointment with urology on 12/18 and during that appointment developed discoloration to her bilateral lower extremities which ultimately developed into unstageable pressure ulcers to the lateral aspects of her bilateral lower extremities. There has been no topical treatment applied to these. She continues to have home health. There is no concerns expressed regarding dietary intake, stating she eats 3 meals a day, eating was provided; she is supplemented with boost with protein. 06/03/17-she is here in follow-up evaluation for sacral and bilateral lower extremity ulcers. Plain film x-ray done today reveals no distraction to the sacrum or coccyx, no visible abnormalities. Home health has ordered the negative pressure wound system but it has not been initiated. We will continue with Hydrofera Blue until initiation of negative pressure wound system and continue with Santyl to bilateral lower extremity ulcers. Follow-up next week 06/10/17-she is here in follow-up evaluation for sacral and bilateral lower extremity ulcers. The wound VAC will be available tomorrow per home health. We will initiate wound VAC therapy to the sacral ulcer 3 times weekly (Thursday, Saturday/Sunday, Tuesday). We will continue with Santyl to the lower extremity ulcers. The patient's son is checking into home health therapy over the weekend for [facility] changes, with the understanding that if VAC changes cannot be performed over the weekend he will need to change his mother's appointments to Monday, Wednesday or Friday. The sacral ulcer clinically does not appear infected but there has been a change in the amount of drainage acutely, there is no significant amount of devitalized tissue, there is no malodor. Wound culture was obtained to evaluate for occult infection we will  hold off on antibiotic therapy until sensitivities are resulted. 06/18/17 on evaluation today patient appears to be doing fairly well in my opinion although this is the first time I  have seen this patient she has been previously evaluated by Tacey RuizLeah here in our office. She is going to be switching to Fridays to see me due to the Wound VAC schedule being changed on Monday, Wednesday, and Friday. Subsequently she seems to be doing fairly well with the Wound VAC. Her son who was present during evaluation today states that he somewhat stresses of the Wound VAC in making sure that it was functioning appropriately. With that being said everything seems to be working well he knows what settings on the [facility] HVAC itself to look at and ensure that it is functioning properly. Overall the wound appears to be nice and clean there is no need for debridement today. She has no discomfort in her bilateral lower extremity ulcers also appear to be improving based on measurements and what this honest tell me about the overall appearance. 07/02/17 on evaluation today I noted in patients wound bed that was actually an odor that had not previously been noted. Subsequently there was also a small area of bone that was not previously noted during my last evaluation. This did appear to be necrotic and was being somewhat forced out by the body around the region of granulation. She does not have any pain which is good news. Nonetheless the overall appearance of the ulcer is making me concerned for the patient having developed osteomyelitis. Currently she is not been on any antibiotics and the Wound VAC has been doing fairly well in general. With that being said I do not think we need to continue the Wound VAC if she potentially has a bone infection. At least not until it is properly addressed with antibiotics. 07/09/17 on evaluation today patient appears to actually be doing very well in regard to her sacral ulcer compared to last  week. There is actually no exposed bone at this point. 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 With that being said still I do believe she needs to have a referral to infectious disease due to the osteomyelitis I am gonna recommend that she continue with the doxycycline based on the culture results which showed Eikenella Corrodens as the Jessica BradyOVERMAN, Rayvin W. ([id number]) organism identified that tetracycline should work for. She does not seem to be having any discomfort whatsoever at this point. 07/16/17 on evaluation today patient actually appears to be doing rather well in regard to her sacral wound. I think that the original wound site actually appears much better than previously noted. With that being said she does have a new superficial injury on the right sacral region which appears to be due to according to the sign transport that occurred for her MRI unfortunately. Fortunately this is not too deep and I do think it can be managed but it was a new area that was not previously noted. Otherwise she has been tolerating the Dakinos soaked gauze packing without complication. 07/30/17 on evaluation today patient presents for reevaluation concerning her sacral wound. She has been tolerating the dressing changes without complication. With that being said her wound is doing so well at this point that I think she may be at the point where we could reinitiate the Wound VAC currently and hopefully see good results from this. I'm very pleased with how she has responded to the Dakinos soaked gauze packing. 08/13/17 evaluation today patient appears to be doing excellent in  regard to her lower extremity ulcer this seems to be cleaning up very nicely. In regard to the sacral ulcer the area of trauma on the right lateral portion of the wound actually appears  to be much better than previously noted during the last evaluation. The word has definitely filled in and the Wound VAC appears to be helpful I do believe. Overall I'm very pleased with how things seem to be progressing at this time. Patient likewise is also happy that things are doing well. 08/27/17 on evaluation today patient presents for follow-up concerning her ongoing issues with her sacral ulcer and lower extremity ulcer. Fortunately she has been tolerating the dressing changes well complication the Wound VAC in general seems to have done very well up to this point. She does not have any evidence of infection which is good news. She does have some dark discoloration in central portion of the wound which is troublesome for the possibility of pressure injury to the site it sounds like her prior air mattress was not functioning properly her son has just bought a new one for her this is doing much better. Otherwise things seem to be progressing nicely. 09/10/17 on evaluation today patient presents for follow-up concerning her sacral wound and lower extremity ulcer. She has been tolerating the switch of her dressing changes to the Dakinos soaked gauze dressing very well in regard to the sacrum. The left lateral lower extremity ulcer appears to have a new area open just distal to the one that we have been treating with Santyl and this is new since her last visit. There does not appear to be any evidence of infection which is good news. With that being said there's a lot of maceration here at the site and I feel this is likely due to the switch and the dressings it appears that due to the sticking of the dressings it will switch to utilizing a telfa pad over the Santyl. I think this caused a lot of drainage to be collected and situated in the region just under the dressing which has led to this causing which duration of breakdown. Overall there does not appear to be any significant pain which is good  news. Of note I did actually have a conversation with the radiologist who is an interventional radiologist with [facility]. 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 With that being said there does not appear to be any rush to get this done according to the interventional radiologist whom I spoke with. Therefore after discussion it was determined that there gonna wait about six months prior to considering the procedure to get time for hopefully the sacral wound to heal in the interim to at least some degree. 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 According to nursing staff these are all improved, the negative pressure wound therapy system was put on hold last week. We will resume negative pressure wound therapy today, continue with Santyl to the lower extremity and she will follow-up next week. 09/24/17 on evaluation today unfortunately the patient's wound appears to be doing significantly worse compared to last week's evaluation and even my valuation the week before. She actually has bone noted on the left wound margin in the region of undermining unfortunately. This was not noted during the last evaluation. I am concerned about the fact that this seems to be worse not better since  our last visit with her. My biggest concern is that she's likely developing a worsening osteomyelitis. This was discussed with patient and her son today during the office visit. 05/03/17 on evaluation today patient actually appears to be doing rather well in regard to her sacral ulcer compared to last week's evaluation. She has been tolerating the dressing changes without complication. Fortunately even though we're not utilizing the Wound VAC she seems to be making some strides in seeing the wound area overall improved quite dramatically in my pinion in just one weeks time. She also seems to  be staying off of this to the point that I do not see any evidence of new injury which is excellent news. Whatever she's been doing in that regard over the past week I would want her to continue. I did also review the results of her bone culture which revealed group B strep as the responsible organism. Again this is what's causing her osteomyelitis she does have infectious disease appointment scheduled for 10/11/17 10/08/1898 valuation today patient appears to be doing better for the most part in regard to the sacral wound. Overall she is showing signs of having granulation of the bone which is good news. There is an area towards the left of the wound where she does seem to be showing some signs of opening this it would be due to additional pressure to the area unfortunately. Jessica [name], Jessica [name]. ([id number]) There does not appear to be any evidence of infection spreading which is good news that do not see any evidence of significant purulent discharge which is also good news. 10/26/17 on evaluation today patient sacral ulcer actually appears to be very healthy and doing well in regard to the overall appearance of the wound. With that being said she does seem to be having some issues at this point in time with some shear/friction injury of the sacrum from where she was transported to [facility] for her infectious disease appointment. She has been placed on IV antibiotic therapy I will have to go back and find that note for review as I did not have access to that today. Nonetheless I will add the next visit be sure to document the exact antibiotics that she is taking at this point. Nonetheless I do believe the wound is doing better there's no bone exposure nothing that requires debridement in regard to the sacral wound. Likewise her left lateral lower extremity ulcer also appears to be doing significantly better. In general I feel like things are showing signs of improvement all around. 11/12/17 on  evaluation today patient appears to be doing rather well in regard to her sacral wound. In general I do see signs of granulation at this point. Fortunately there does not appear to be any evidence of infection which is good news as well. In general overall happy with the appearance. With that being said I'll be see I would like for this to be progressing more rapidly as with the patient and her son but nonetheless at least things do not appear to be doing worse. Her lower extremity ulcer is doing significantly better. 11/26/17 on evaluation today patient appears to be doing a little better in regard to the sacral wound especially in the peripheral of the wound bed. She actually did have an abrasion on the right gluteal region that's completely resolved to this point. Her lower extremity also appears to be for the most part completely resolved. Overall the sacrum itself seems to be the one thing that is  still open and given her trouble. No fevers, chills, nausea, or vomiting noted at this time. She actually has an appointment next week with infectious disease for recheck to see how things are doing. She maybe switch to oral medications at that point. 12/09/17 on evaluation today patient actually appears to be doing much better in regard to her sacral wound. I do feel like she has made good progress at this time as far as healing is concerned. In fact the wound looks better to me today than it has in quite some time. She is on oral antibiotic therapy and no longer on the IV antibiotics. She did see infectious disease last week. 12/24/17 on evaluation today patient's wounds actually appear to be doing decently well in regard to the sacral wound in particular. When it comes to her lower extremity ulcers both appear to be completely healed at this point in the one area where she had lived the rash has completely resolved at this time. Overall very pleased with the progress that has been made. Nonetheless  the patient seems to be doing well in general. She still does have quite a bit of healing to do in regard to the sacral wound but I feel like she is making progress. 01/07/18 on evaluation today patient actually appears to be doing excellent today regard to her sacral wound. The granular quality in the base of the wound is excellent and shown signs of good improvement there's no evidence of contusion nor deep tissue injury and the periwound actually appears to be doing well. In general I'm very pleased with the overall appearance. Electronic Signature(s) Signed: 01/07/2018 5:13:23 PM By: Lenda Kelp PA-C Entered By: Lenda Kelp on 01/07/2018 15:06:13 Jessica [name] ([id number]) -------------------------------------------------------------------------------- Physical Exam Details [name]. Date of Service: 01/07/2018 2:45 PM Medical Record Number: [id number] Patient Account Number: [id number] Date of Birth/Sex: [birth year] (72 y.o. F) Treating RN: Curtis Sites Primary Care Provider: Annita Brod Other Clinician: Referring Provider: Annita Brod Treating Provider/Extender: STONE III, Alma Mohiuddin Weeks in Treatment: 32 Constitutional Well-nourished and well-hydrated in no acute distress. Respiratory normal breathing without difficulty. clear to auscultation bilaterally. Cardiovascular regular rate and rhythm with normal S1, S2. Psychiatric this patient is able to make decisions and demonstrates good insight into disease process. Alert and Oriented x 3. pleasant and cooperative. Notes Patient's wound bed shows evidence of again great granulation there's no evidence of infection at this time and overall I'm extremely pleased with how things appear. I think she's definitely okay to stretch out to a three week follow-up visit at this point. Electronic Signature(s) Signed: 01/07/2018 5:13:23 PM By: Lenda Kelp PA-C Entered By: Lenda Kelp on 01/07/2018  15:07:50 Jessica [name] ([id number]) -------------------------------------------------------------------------------- Physician Orders Details [name]. Date of Service: 01/07/2018 2:45 PM Medical Record Number: [id number] Patient Account Number: [id number] Date of Birth/Sex: [birth year] (72 y.o. F) Treating RN: Curtis Sites Primary Care Provider: Annita Brod Other Clinician: Referring Provider: Annita Brod Treating Provider/Extender: Linwood Dibbles, Booker Bhatnagar Weeks in Treatment: 32 Verbal / Phone Orders: No Diagnosis Coding ICD-10 Coding Code Description L89.154 Pressure ulcer of sacral region, stage 4 M48.00 Spinal stenosis, site unspecified R54 Age-related physical debility G89.4 Chronic pain syndrome E78.2 Mixed hyperlipidemia L89.93 Pressure ulcer of unspecified site, stage 3 Wound Cleansing Wound #1 Medial Sacrum o Other: - please cleanse sacral wound with dakins moistened gauze - do not spray dakins on wound Anesthetic (add to Medication List) Wound #1 Medial Sacrum   o Topical Lidocaine 4% cream applied to wound bed prior to debridement (In Clinic Only). Skin Barriers/Peri-Wound Care Wound #1 Medial Sacrum o Skin Prep Primary Wound Dressing Wound #1 Medial Sacrum o Pack wound with: - plain collagen without silver - use under wound vac Secondary Dressing Wound #1 Medial Sacrum o ABD pad - secure with tape use until wound vac is placed back on wound Dressing Change Frequency Wound #1 Medial Sacrum o Change Dressing Monday, Wednesday, Friday Follow-up Appointments Wound #1 Medial Sacrum o Return Appointment in 3 weeks. Off-Loading Wound #1 Medial Sacrum STATIA, BURDICK. ([id number]) o Turn and reposition every 2 hours Additional Orders / Instructions Wound #1 Medial Sacrum o Increase protein intake. Home Health Wound #1 Medial Sacrum o Continue Home Health Visits - Advanced HHRN to start wound vac o Home Health  Nurse may visit PRN to address patientos wound care needs. o FACE TO FACE ENCOUNTER: MEDICARE and MEDICAID PATIENTS: I certify that this patient is under my care and that I had a face-to-face encounter that meets the physician face-to-face encounter requirements with this patient on this date. The encounter with the patient was in whole or in part for the following MEDICAL CONDITION: (primary reason for Home Healthcare) MEDICAL NECESSITY: I certify, that based on my findings, NURSING services are a medically necessary home health service. HOME BOUND STATUS: I certify that my clinical findings support that this patient is homebound (i.e., Due to illness or injury, pt requires aid of supportive devices such as crutches, cane, wheelchairs, walkers, the use of special transportation or the assistance of another person to leave their place of residence. There is a normal inability to leave the home and doing so requires considerable and taxing effort. Other absences are for medical reasons / religious services and are infrequent or of short duration when for other reasons). o If current dressing causes regression in wound condition, may D/C ordered dressing product/s and apply Normal Saline Moist Dressing daily until next Wound Healing Center / Other MD appointment. Notify Wound Healing Center of regression in wound condition at [phone]. o Please direct any NON-WOUND related issues/requests for orders to patient's Primary Care Physician Negative Pressure Wound Therapy Wound #1 Medial Sacrum o Wound VAC settings at 125/130 mmHg continuous pressure. Use BLACK/GREEN foam to wound cavity. Use WHITE foam to fill any tunnel/s and/or undermining. Change VAC dressing 3 X WEEK. Change canister as indicated when full. Nurse may titrate settings and frequency of dressing changes as clinically indicated. - HHRN to start wound vac HHRN to change on Mondays, Wednesdays and every other Friday o Home  Health Nurse may d/c VAC for s/s of increased infection, significant wound regression, or uncontrolled drainage. Notify Wound Healing Center at [phone]. o Number of foam/gauze pieces used in the dressing = Electronic Signature(s) Signed: 01/07/2018 5:07:50 PM By: Curtis Sites Signed: 01/07/2018 5:13:23 PM By: Lenda Kelp PA-C Entered By: Curtis Sites on 01/07/2018 14:54:41 Jessica [name] ([id number]) -------------------------------------------------------------------------------- Problem List Details [name]. Date of Service: 01/07/2018 2:45 PM Medical Record Number: [id number] Patient Account Number: [id number] Date of Birth/Sex: [birth year] (72 y.o. F) Treating RN: Curtis Sites Primary Care Provider: Annita Brod Other Clinician: Referring Provider: Annita Brod Treating Provider/Extender: Linwood Dibbles, Yacob Wilkerson Weeks in Treatment: 32 Active Problems ICD-10 Evaluated Encounter Code Description Active Date Today Diagnosis L89.154 Pressure ulcer of sacral region, stage 4 05/27/2017 No Yes M48.00 Spinal stenosis, site unspecified 05/27/2017 No Yes R54 Age-related physical debility 05/27/2017 No Yes G89.4  Chronic pain syndrome 05/27/2017 No Yes E78.2 Mixed hyperlipidemia 05/27/2017 No Yes L89.93 Pressure ulcer of unspecified site, stage 3 05/27/2017 No Yes Inactive Problems Resolved Problems Electronic Signature(s) Signed: 01/07/2018 5:13:23 PM By: Lenda Kelp PA-C Entered By: Lenda Kelp on 01/07/2018 14:47:53 Jessica [name] ([id number]) -------------------------------------------------------------------------------- Progress Note Details [name]. Date of Service: 01/07/2018 2:45 PM Medical Record Number: [id number] Patient Account Number: [id number] Date of Birth/Sex: [birth year] (72 y.o. F) Treating RN: Curtis Sites Primary Care Provider: Annita Brod Other Clinician: Referring Provider: Annita Brod Treating Provider/Extender: Linwood Dibbles, Pansy Ostrovsky Weeks in Treatment: 32 Subjective Chief Complaint Information obtained from Patient she is here for evaluation of a sacral ulcer and bilateral lower extremity ulcers History of Present Illness (HPI) 05/27/17-she is here in initial evaluation for a left-sided sacral stage IV pressure ulcer and bilateral lower extremity, lateral aspect, unstageable pressure ulcers. She is accompanied by her husband and her son, who are her primary caregivers. She is bedbound secondary to spinal stenosis. According to her son and husband she was hospitalized from 10/5-11/1 with healthcare associated pneumonia and altered mental status. During her hospitalization she was intubated, extubated on 10/27. She was discharged with Foley catheter and follow up with urology. According to her son and spouse she developed the sacral ulcer during hospitalization. Home health has been applying Santyl daily. She does have a low air loss mattress and is repositioned every 2-3 hours per her family's report. According to the son and spouse she had an appointment with urology on 12/18 and during that appointment developed discoloration to her bilateral lower extremities which ultimately developed into unstageable pressure ulcers to the lateral aspects of her bilateral lower extremities. There has been no topical treatment applied to these. She continues to have home health. There is no concerns expressed regarding dietary intake, stating she eats 3 meals a day, eating was provided; she is supplemented with boost with protein. 06/03/17-she is here in follow-up evaluation for sacral and bilateral lower extremity ulcers. Plain film x-ray done today reveals no distraction to the sacrum or coccyx, no visible abnormalities. Home health has ordered the negative pressure wound system but it has not been initiated. We will continue with Hydrofera Blue until initiation of negative pressure  wound system and continue with Santyl to bilateral lower extremity ulcers. Follow-up next week 06/10/17-she is here in follow-up evaluation for sacral and bilateral lower extremity ulcers. The wound VAC will be available tomorrow per home health. We will initiate wound VAC therapy to the sacral ulcer 3 times weekly (Thursday, Saturday/Sunday, Tuesday). We will continue with Santyl to the lower extremity ulcers. The patient's son is checking into home health therapy over the weekend for [facility] changes, with the understanding that if VAC changes cannot be performed over the weekend he will need to change his mother's appointments to Monday, Wednesday or Friday. The sacral ulcer clinically does not appear infected but there has been a change in the amount of drainage acutely, there is no significant amount of devitalized tissue, there is no malodor. Wound culture was obtained to evaluate for occult infection we will hold off on antibiotic therapy until sensitivities are resulted. 06/18/17 on evaluation today patient appears to be doing fairly well in my opinion although this is the first time I have seen this patient she has been previously evaluated by Tacey Ruiz here in our office. She is going to be switching to Fridays to see me due to the Wound VAC schedule being changed  on Monday, Wednesday, and Friday. Subsequently she seems to be doing fairly well with the Wound VAC. Her son who was present during evaluation today states that he somewhat stresses of the Wound VAC in making sure that it was functioning appropriately. With that being said everything seems to be working well he knows what settings on the [facility] itself to look at and ensure that it is functioning properly. Overall the wound appears to be nice and clean there is no need for debridement today. She has no discomfort in her bilateral lower extremity ulcers also appear to be improving based on measurements and what this honest tell me about the  overall appearance. 07/02/17 on evaluation today I noted in patients wound bed that was actually an odor that had not previously been noted. Subsequently there was also a small area of bone that was not previously noted during my last evaluation. This did appear to be necrotic and was being somewhat forced out by the body around the region of granulation. She does not have any pain which is good news. Nonetheless the overall appearance of the ulcer is making me concerned for the patient having developed osteomyelitis. Currently she is not been on any antibiotics and the Wound VAC has been doing fairly well in general. With that being said I do not think we need to continue the Wound VAC if she potentially has a bone infection. At least not until it is properly addressed with antibiotics. Jessica [name], Jessica [name] ([id number]) 07/09/17 on evaluation today patient appears to actually be doing very well in regard to her sacral ulcer compared to last week. There is actually no exposed bone at this point. 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 With that being said still I do believe she needs to have a referral to infectious disease due to the osteomyelitis I am gonna recommend that she continue with the doxycycline based on the culture results which showed Eikenella Corrodens as the organism identified that tetracycline should work for. She does not seem to be having any discomfort whatsoever at this point. 07/16/17 on evaluation today patient actually appears to be doing rather well in regard to her sacral wound. I think that the original wound site actually appears much better than previously noted. With that being said she does have a new superficial injury on the right sacral region which appears to be due to according to the sign transport that  occurred for her MRI unfortunately. Fortunately this is not too deep and I do think it can be managed but it was a new area that was not previously noted. Otherwise she has been tolerating the Dakin s soaked gauze packing without complication. 07/30/17 on evaluation today patient presents for reevaluation concerning her sacral wound. She has been tolerating the dressing changes without complication. With that being said her wound is doing so well at this point that I think she may be at the point where we could reinitiate the Wound VAC currently and hopefully see good results from this. I'm very pleased with how she has responded to the [facility] s soaked gauze packing. 08/13/17 evaluation today patient appears to be doing excellent in regard to her lower extremity ulcer this seems to be cleaning up very nicely. In regard to the sacral ulcer the area of trauma on the right lateral portion of the  wound actually appears to be much better than previously noted during the last evaluation. The word has definitely filled in and the Wound VAC appears to be helpful I do believe. Overall I'm very pleased with how things seem to be progressing at this time. Patient likewise is also happy that things are doing well. 08/27/17 on evaluation today patient presents for follow-up concerning her ongoing issues with her sacral ulcer and lower extremity ulcer. Fortunately she has been tolerating the dressing changes well complication the Wound VAC in general seems to have done very well up to this point. She does not have any evidence of infection which is good news. She does have some dark discoloration in central portion of the wound which is troublesome for the possibility of pressure injury to the site it sounds like her prior air mattress was not functioning properly her son has just bought a new one for her this is doing much better. Otherwise things seem to be progressing nicely. 09/10/17 on evaluation today patient  presents for follow-up concerning her sacral wound and lower extremity ulcer. She has been tolerating the switch of her dressing changes to the Dakin s soaked gauze dressing very well in regard to the sacrum. The left lateral lower extremity ulcer appears to have a new area open just distal to the one that we have been treating with Santyl and this is new since her last visit. There does not appear to be any evidence of infection which is good news. With that being said there's a lot of maceration here at the site and I feel this is likely due to the switch and the dressings it appears that due to the sticking of the dressings it will switch to utilizing a telfa pad over the Santyl. I think this caused a lot of drainage to be collected and situated in the region just under the dressing which has led to this causing which duration of breakdown. Overall there does not appear to be any significant pain which is good news. Of note I did actually have a conversation with the radiologist who is an interventional radiologist with [facility]. 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 With that being said there does not appear to be any rush to get this done according to the interventional radiologist whom I spoke with. Therefore after discussion it was determined that there gonna wait about six months prior to considering the procedure to get time for hopefully the sacral wound to heal in the interim to at least some degree. 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 According to nursing staff these are all improved, the negative pressure wound therapy system was put on hold last week. We will resume negative pressure wound therapy today, continue with Santyl to the lower extremity and she will follow-up next week. 09/24/17 on evaluation today unfortunately the patient's wound  appears to be doing significantly worse compared to last week's evaluation and even my valuation the week before. She actually has bone noted on the left wound margin in the region of undermining unfortunately. This was not noted during the last evaluation. I am concerned about the fact that this seems to be worse not better since our last visit with her. My biggest concern is that she's likely developing a worsening osteomyelitis. This was discussed with patient and her son today during the office visit. 05/03/17  on evaluation today patient actually appears to be doing rather well in regard to her sacral ulcer compared to last week's evaluation. She has been tolerating the dressing changes without complication. Fortunately even though we're not utilizing the Wound VAC she seems to be making some strides in seeing the wound area overall improved quite dramatically in my pinion in just one weeks time. She also seems to be staying off of this to the point that I do not see any evidence of new injury which is excellent news. Whatever she's been doing in that regard over the past week I would want her to continue. Jessica [name], Jessica [name] ([id number]) did also review the results of her bone culture which revealed group B strep as the responsible organism. Again this is what's causing her osteomyelitis she does have infectious disease appointment scheduled for 10/11/17 10/08/1898 valuation today patient appears to be doing better for the most part in regard to the sacral wound. Overall she is showing signs of having granulation of the bone which is good news. There is an area towards the left of the wound where she does seem to be showing some signs of opening this it would be due to additional pressure to the area unfortunately. There does not appear to be any evidence of infection spreading which is good news that do not see any evidence of significant purulent discharge which is also good news. 10/26/17 on  evaluation today patient sacral ulcer actually appears to be very healthy and doing well in regard to the overall appearance of the wound. With that being said she does seem to be having some issues at this point in time with some shear/friction injury of the sacrum from where she was transported to [facility] for her infectious disease appointment. She has been placed on IV antibiotic therapy I will have to go back and find that note for review as I did not have access to that today. Nonetheless I will add the next visit be sure to document the exact antibiotics that she is taking at this point. Nonetheless I do believe the wound is doing better there's no bone exposure nothing that requires debridement in regard to the sacral wound. Likewise her left lateral lower extremity ulcer also appears to be doing significantly better. In general I feel like things are showing signs of improvement all around. 11/12/17 on evaluation today patient appears to be doing rather well in regard to her sacral wound. In general I do see signs of granulation at this point. Fortunately there does not appear to be any evidence of infection which is good news as well. In general overall happy with the appearance. With that being said I'll be see I would like for this to be progressing more rapidly as with the patient and her son but nonetheless at least things do not appear to be doing worse. Her lower extremity ulcer is doing significantly better. 11/26/17 on evaluation today patient appears to be doing a little better in regard to the sacral wound especially in the peripheral of the wound bed. She actually did have an abrasion on the right gluteal region that's completely resolved to this point. Her lower extremity also appears to be for the most part completely resolved. Overall the sacrum itself seems to be the one thing that is still open and given her trouble. No fevers, chills, nausea, or vomiting noted at this time.  She actually has an appointment next week with infectious disease for recheck to see  how things are doing. She maybe switch to oral medications at that point. 12/09/17 on evaluation today patient actually appears to be doing much better in regard to her sacral wound. I do feel like she has made good progress at this time as far as healing is concerned. In fact the wound looks better to me today than it has in quite some time. She is on oral antibiotic therapy and no longer on the IV antibiotics. She did see infectious disease last week. 12/24/17 on evaluation today patient's wounds actually appear to be doing decently well in regard to the sacral wound in particular. When it comes to her lower extremity ulcers both appear to be completely healed at this point in the one area where she had lived the rash has completely resolved at this time. Overall very pleased with the progress that has been made. Nonetheless the patient seems to be doing well in general. She still does have quite a bit of healing to do in regard to the sacral wound but I feel like she is making progress. 01/07/18 on evaluation today patient actually appears to be doing excellent today regard to her sacral wound. The granular quality in the base of the wound is excellent and shown signs of good improvement there's no evidence of contusion nor deep tissue injury and the periwound actually appears to be doing well. In general I'm very pleased with the overall appearance. Patient History Information obtained from Patient. Family History No family history of Cancer, Diabetes, Heart Disease. Social History Never smoker, Marital Status - Married. Review of Systems (ROS) Constitutional Symptoms (General Health) Denies complaints or symptoms of Fever, Chills. Respiratory Jessica [name], Jessica [name] ([id number]) The patient has no complaints or symptoms. Cardiovascular The patient has no complaints or symptoms. Psychiatric The patient has  no complaints or symptoms. Objective Constitutional Well-nourished and well-hydrated in no acute distress. Vitals Time Taken: 2:20 PM, Height: 70 in, Temperature: 98.2 F, Pulse: 67 bpm, Respiratory Rate: 16 breaths/min, Blood Pressure: 122/53 mmHg. Respiratory normal breathing without difficulty. clear to auscultation bilaterally. Cardiovascular regular rate and rhythm with normal S1, S2. Psychiatric this patient is able to make decisions and demonstrates good insight into disease process. Alert and Oriented x 3. pleasant and cooperative. General Notes: Patient's wound bed shows evidence of again great granulation there's no evidence of infection at this time and overall I'm extremely pleased with how things appear. I think she's definitely okay to stretch out to a three week follow-up visit at this point. Integumentary (Hair, Skin) Wound #1 status is Open. Original cause of wound was Pressure Injury. The wound is located on the Medial Sacrum. The wound measures 6.5cm length x 5.5cm width x 1.5cm depth; 28.078cm^2 area and 42.117cm^3 volume. There is bone, muscle, and Fat Layer (Subcutaneous Tissue) Exposed exposed. There is no tunneling noted, however, there is undermining starting at 9:00 and ending at 11:00 with a maximum distance of 3cm. There is a large amount of serosanguineous drainage noted. The wound margin is distinct with the outline attached to the wound base. There is medium (34-66%) red, pink, hyper - granulation within the wound bed. There is a medium (34-66%) amount of necrotic tissue within the wound bed including Eschar and Adherent Slough. The periwound skin appearance exhibited: Induration, Scarring, Rubor, Erythema. The periwound skin appearance did not exhibit: Callus, Crepitus, Excoriation, Rash, Dry/Scaly, Maceration, Atrophie Blanche, Cyanosis, Ecchymosis, Hemosiderin Staining, Mottled, Pallor. The surrounding wound skin color is noted with erythema which is  circumferential. Periwound temperature  was noted as No Abnormality. The periwound has tenderness on palpation. Assessment Active Problems ICD-10 Jessica [name], Jessica [name] ([id number]) Pressure ulcer of sacral region, stage 4 Spinal stenosis, site unspecified Age-related physical debility Chronic pain syndrome Mixed hyperlipidemia Pressure ulcer of unspecified site, stage 3 Plan Wound Cleansing: Wound #1 Medial Sacrum: Other: - please cleanse sacral wound with dakins moistened gauze - do not spray dakins on wound Anesthetic (add to Medication List): Wound #1 Medial Sacrum: Topical Lidocaine 4% cream applied to wound bed prior to debridement (In Clinic Only). Skin Barriers/Peri-Wound Care: Wound #1 Medial Sacrum: Skin Prep Primary Wound Dressing: Wound #1 Medial Sacrum: Pack wound with: - plain collagen without silver - use under wound vac Secondary Dressing: Wound #1 Medial Sacrum: ABD pad - secure with tape use until wound vac is placed back on wound Dressing Change Frequency: Wound #1 Medial Sacrum: Change Dressing Monday, Wednesday, Friday Follow-up Appointments: Wound #1 Medial Sacrum: Return Appointment in 3 weeks. Off-Loading: Wound #1 Medial Sacrum: Turn and reposition every 2 hours Additional Orders / Instructions: Wound #1 Medial Sacrum: Increase protein intake. Home Health: Wound #1 Medial Sacrum: Continue Home Health Visits - Advanced HHRN to start wound vac Home Health Nurse may visit PRN to address patient s wound care needs. FACE TO FACE ENCOUNTER: MEDICARE and MEDICAID PATIENTS: I certify that this patient is under my care and that I had a face-to-face encounter that meets the physician face-to-face encounter requirements with this patient on this date. The encounter with the patient was in whole or in part for the following MEDICAL CONDITION: (primary reason for Home Healthcare) MEDICAL NECESSITY: I certify, that based on my findings, NURSING services are a  medically necessary home health service. HOME BOUND STATUS: I certify that my clinical findings support that this patient is homebound (i.e., Due to illness or injury, pt requires aid of supportive devices such as crutches, cane, wheelchairs, walkers, the use of special transportation or the assistance of another person to leave their place of residence. There is a normal inability to leave the home and doing so requires considerable and taxing effort. Other absences are for medical reasons / religious services and are infrequent or of short duration when for other reasons). If current dressing causes regression in wound condition, may D/C ordered dressing product/s and apply Normal Saline Moist Dressing daily until next Wound Healing Center / Other MD appointment. Notify Wound Healing Center of regression in wound condition at [phone]. Please direct any NON-WOUND related issues/requests for orders to patient's Primary Care Physician Negative Pressure Wound Therapy: Jessica [name], Jessica [name]. ([id number]) Wound #1 Medial Sacrum: Wound VAC settings at 125/130 mmHg continuous pressure. Use BLACK/GREEN foam to wound cavity. Use WHITE foam to fill any tunnel/s and/or undermining. Change VAC dressing 3 X WEEK. Change canister as indicated when full. Nurse may titrate settings and frequency of dressing changes as clinically indicated. - HHRN to start wound vac HHRN to change on Mondays, Wednesdays and every other Friday Home Health Nurse may d/c VAC for s/s of increased infection, significant wound regression, or uncontrolled drainage. Notify Wound Healing Center at [phone]. Number of foam/gauze pieces used in the dressing = We will continue with the Wound VAC since she seems to be tolerating and doing well in regard to this currently. I'm hopeful that she will make ongoing progress no sharp debridement was necessary today which is also great news. Will see were things stand at follow-up. Please  see above for specific wound care orders. We will see  patient for re-evaluation in 3 week(s) here in the clinic. If anything worsens or changes patient will contact our office for additional recommendations. Electronic Signature(s) Signed: 01/07/2018 5:13:23 PM By: Lenda Kelp PA-C Entered By: Lenda Kelp on 01/07/2018 15:39:01 Jessica [name] ([id number]) -------------------------------------------------------------------------------- ROS/PFSH Details [name]. Date of Service: 01/07/2018 2:45 PM Medical Record Number: [id number] Patient Account Number: [id number] Date of Birth/Sex: 05-Aug-1945 (72 y.o. F) Treating RN: Curtis Sites Primary Care Provider: Annita Brod Other Clinician: Referring Provider: Annita Brod Treating Provider/Extender: STONE III, Tinaya Ceballos Weeks in Treatment: 32 Information Obtained From Patient Wound History Constitutional Symptoms (General Health) Complaints and Symptoms: Negative for: Fever; Chills Respiratory Complaints and Symptoms: No Complaints or Symptoms Cardiovascular Complaints and Symptoms: No Complaints or Symptoms Psychiatric Complaints and Symptoms: No Complaints or Symptoms Immunizations Pneumococcal Vaccine: Received Pneumococcal Vaccination: No Implantable Devices Family and Social History Cancer: No; Diabetes: No; Heart Disease: No; Never smoker; Marital Status - Married Physician Affirmation I have reviewed and agree with the above information. Electronic Signature(s) Signed: 01/07/2018 5:07:50 PM By: Curtis Sites Signed: 01/07/2018 5:13:23 PM By: Lenda Kelp PA-C Entered By: Lenda Kelp on 01/07/2018 15:07:08 Jessica [name] ([id number]) -------------------------------------------------------------------------------- SuperBill Details [name]. Date of Service: 01/07/2018 Medical Record Number: [id number] Patient Account Number: [id number] Date of Birth/Sex:  1945/12/26 (72 y.o. F) Treating RN: Curtis Sites Primary Care Provider: Annita Brod Other Clinician: Referring Provider: Annita Brod Treating Provider/Extender: STONE III, Jaterrius Ricketson Weeks in Treatment: 32 Diagnosis Coding ICD-10 Codes Code Description L89.154 Pressure ulcer of sacral region, stage 4 M48.00 Spinal stenosis, site unspecified R54 Age-related physical debility G89.4 Chronic pain syndrome E78.2 Mixed hyperlipidemia L89.93 Pressure ulcer of unspecified site, stage 3 Facility Procedures CPT4 Code: 62952841 Description: 32440 - WOUND CARE VISIT-LEV 2 EST PT Modifier: Quantity: 1 Physician Procedures CPT4 Code: 1027253 Description: 99213 - WC PHYS LEVEL 3 - EST PT ICD-10 Diagnosis Description L89.154 Pressure ulcer of sacral region, stage 4 M48.00 Spinal stenosis, site unspecified R54 Age-related physical debility G89.4 Chronic pain syndrome Modifier: Quantity: 1 Electronic Signature(s) Signed: 01/07/2018 5:13:23 PM By: Lenda Kelp PA-C Entered By: Lenda Kelp on 01/07/2018 15:39:15

## 2018-01-09 NOTE — Progress Notes (Signed)
Jessica [name] ([id number]) Visit Report for 01/07/2018 Arrival Information Details [name], Jessica [name]. Date of Service: 01/07/2018 2:45 PM Medical Record Number: [id number] Patient Account Number: [id number] Date of Birth/Sex: 10-05-1945 (72 y.o. F) Treating RN: Curtis Sites Primary Care Jenniah Bhavsar: Annita Brod Other Clinician: Referring Rylend Pietrzak: Annita Brod Treating Caetano Oberhaus/Extender: Linwood Dibbles, HOYT Weeks in Treatment: 32 Visit Information History Since Last Visit Added or deleted any medications: No Patient Arrived: Stretcher Any new allergies or adverse reactions: No Arrival Time: 14:22 Had a fall or experienced change in No Accompanied By: son and EMS activities of daily living that may affect Transfer Assistance: Manual risk of falls: Patient Identification Verified: Yes Signs or symptoms of abuse/neglect since last visito No Secondary Verification Process Yes Hospitalized since last visit: No Completed: Implantable device outside of the clinic excluding No Patient Has Alerts: Yes cellular tissue based products placed in the center Patient Alerts: ALLERGIC TO since last visit: ZINC Has Dressing in Place as Prescribed: Yes Pain Present Now: No Electronic Signature(s) Signed: 01/07/2018 2:59:28 PM By: Dayton Martes RCP, RRT, CHT Entered By: Dayton Martes on 01/07/2018 14:24:01 Jessica [name] ([id number]) -------------------------------------------------------------------------------- Clinic Level of Care Assessment Details [name]. Date of Service: 01/07/2018 2:45 PM Medical Record Number: [id number] Patient Account Number: [id number] Date of Birth/Sex: Jun 12, 1945 (72 y.o. F) Treating RN: Curtis Sites Primary Care Deliyah Muckle: Annita Brod Other Clinician: Referring Maleke Feria: Annita Brod Treating Sherley Mckenney/Extender: STONE III, HOYT Weeks in Treatment: 32 Clinic Level of Care  Assessment Items TOOL 4 Quantity Score []  - Use when only an EandM is performed on FOLLOW-UP visit 0 ASSESSMENTS - Nursing Assessment / Reassessment X - Reassessment of Co-morbidities (includes updates in patient status) 1 10 X- 1 5 Reassessment of Adherence to Treatment Plan ASSESSMENTS - Wound and Skin Assessment / Reassessment X - Simple Wound Assessment / Reassessment - one wound 1 5 []  - 0 Complex Wound Assessment / Reassessment - multiple wounds []  - 0 Dermatologic / Skin Assessment (not related to wound area) ASSESSMENTS - Focused Assessment []  - Circumferential Edema Measurements - multi extremities 0 []  - 0 Nutritional Assessment / Counseling / Intervention []  - 0 Lower Extremity Assessment (monofilament, tuning fork, pulses) []  - 0 Peripheral Arterial Disease Assessment (using hand held doppler) ASSESSMENTS - Ostomy and/or Continence Assessment and Care []  - Incontinence Assessment and Management 0 []  - 0 Ostomy Care Assessment and Management (repouching, etc.) PROCESS - Coordination of Care X - Simple Patient / Family Education for ongoing care 1 15 []  - 0 Complex (extensive) Patient / Family Education for ongoing care []  - 0 Staff obtains Chiropractor, Records, Test Results / Process Orders []  - 0 Staff telephones HHA, Nursing Homes / Clarify orders / etc []  - 0 Routine Transfer to another Facility (non-emergent condition) []  - 0 Routine Hospital Admission (non-emergent condition) []  - 0 New Admissions / Manufacturing engineer / Ordering NPWT, Apligraf, etc. []  - 0 Emergency Hospital Admission (emergent condition) X- 1 10 Simple Discharge Coordination CARLING, LIBERMAN. ([id number]) []  - 0 Complex (extensive) Discharge Coordination PROCESS - Special Needs []  - Pediatric / Minor Patient Management 0 []  - 0 Isolation Patient Management []  - 0 Hearing / Language / Visual special needs []  - 0 Assessment of Community assistance (transportation, D/C planning,  etc.) []  - 0 Additional assistance / Altered mentation []  - 0 Support Surface(s) Assessment (bed, cushion, seat, etc.) INTERVENTIONS - Wound Cleansing / Measurement X - Simple Wound Cleansing - one wound  1 5 []  - 0 Complex Wound Cleansing - multiple wounds X- 1 5 Wound Imaging (photographs - any number of wounds) []  - 0 Wound Tracing (instead of photographs) X- 1 5 Simple Wound Measurement - one wound []  - 0 Complex Wound Measurement - multiple wounds INTERVENTIONS - Wound Dressings X - Small Wound Dressing one or multiple wounds 1 10 []  - 0 Medium Wound Dressing one or multiple wounds []  - 0 Large Wound Dressing one or multiple wounds []  - 0 Application of Medications - topical []  - 0 Application of Medications - injection INTERVENTIONS - Miscellaneous []  - External ear exam 0 []  - 0 Specimen Collection (cultures, biopsies, blood, body fluids, etc.) []  - 0 Specimen(s) / Culture(s) sent or taken to Lab for analysis []  - 0 Patient Transfer (multiple staff / Nurse, adult / Similar devices) []  - 0 Simple Staple / Suture removal (25 or less) []  - 0 Complex Staple / Suture removal (26 or more) []  - 0 Hypo / Hyperglycemic Management (close monitor of Blood Glucose) []  - 0 Ankle / Brachial Index (ABI) - do not check if billed separately X- 1 5 Vital Signs Vitug, Theodosia W. ([id number]) Has the patient been seen at the hospital within the last three years: Yes Total Score: 75 Level Of Care: New/Established - Level 2 Electronic Signature(s) Signed: 01/07/2018 5:07:50 PM By: Curtis Sites Entered By: Curtis Sites on 01/07/2018 15:01:45 Jessica [name] ([id number]) -------------------------------------------------------------------------------- Lower Extremity Assessment Details [name]. Date of Service: 01/07/2018 2:45 PM Medical Record Number: [id number] Patient Account Number: [id number] Date of Birth/Sex: 03/12/1946 (72 y.o. F) Treating  RN: Renne Crigler Primary Care Teiana Hajduk: Annita Brod Other Clinician: Referring Elmira Olkowski: Annita Brod Treating Avarose Mervine/Extender: Linwood Dibbles, HOYT Weeks in Treatment: 32 Electronic Signature(s) Signed: 01/07/2018 4:22:48 PM By: Renne Crigler Entered By: Renne Crigler on 01/07/2018 14:38:48 Jessica [name] ([id number]) -------------------------------------------------------------------------------- Multi Wound Chart Details [name]. Date of Service: 01/07/2018 2:45 PM Medical Record Number: [id number] Patient Account Number: [id number] Date of Birth/Sex: Oct 28, 1945 (72 y.o. F) Treating RN: Curtis Sites Primary Care Lea Walbert: Annita Brod Other Clinician: Referring Raziel Koenigs: Annita Brod Treating Langford Carias/Extender: STONE III, HOYT Weeks in Treatment: 32 Vital Signs Height(in): 70 Pulse(bpm): 67 Weight(lbs): Blood Pressure(mmHg): 122/53 Body Mass Index(BMI): Temperature(F): 98.2 Respiratory Rate 16 (breaths/min): Photos: [1:No Photos] [N/A:N/A] Wound Location: [1:Sacrum - Medial] [N/A:N/A] Wounding Event: [1:Pressure Injury] [N/A:N/A] Primary Etiology: [1:Pressure Ulcer] [N/A:N/A] Date Acquired: [1:01/15/2017] [N/A:N/A] Weeks of Treatment: [1:32] [N/A:N/A] Wound Status: [1:Open] [N/A:N/A] Measurements L x W x D [1:6.5x5.5x1.5] [N/A:N/A] (cm) Area (cm) : [1:28.078] [N/A:N/A] Volume (cm) : [1:42.117] [N/A:N/A] % Reduction in Area: [1:23.80%] [N/A:N/A] % Reduction in Volume: [1:-14.30%] [N/A:N/A] Starting Position 1 [1:9] (o'clock): Ending Position 1 [1:11] (o'clock): Maximum Distance 1 (cm): [1:3] Undermining: [1:Yes] [N/A:N/A] Classification: [1:Category/Stage IV] [N/A:N/A] Exudate Amount: [1:Large] [N/A:N/A] Exudate Type: [1:Serosanguineous] [N/A:N/A] Exudate Color: [1:red, brown] [N/A:N/A] Wound Margin: [1:Distinct, outline attached] [N/A:N/A] Granulation Amount: [1:Medium (34-66%)] [N/A:N/A] Granulation Quality:  [1:Red, Pink, Hyper-granulation] [N/A:N/A] Necrotic Amount: [1:Medium (34-66%)] [N/A:N/A] Necrotic Tissue: [1:Eschar, Adherent Slough] [N/A:N/A] Exposed Structures: [1:Fat Layer (Subcutaneous Tissue) Exposed: Yes Muscle: Yes Bone: Yes Fascia: No Tendon: No Joint: No] [N/A:N/A] Epithelialization: [1:None] [N/A:N/A] Periwound Skin Texture: [N/A:N/A] Induration: Yes Scarring: Yes Excoriation: No Callus: No Crepitus: No Rash: No Periwound Skin Moisture: Maceration: No N/A N/A Dry/Scaly: No Periwound Skin Color: Erythema: Yes N/A N/A Rubor: Yes Atrophie Blanche: No Cyanosis: No Ecchymosis: No Hemosiderin Staining: No Mottled: No Pallor: No Erythema Location: Circumferential N/A N/A  Temperature: No Abnormality N/A N/A Tenderness on Palpation: Yes N/A N/A Wound Preparation: Ulcer Cleansing: N/A N/A Rinsed/Irrigated with Saline Topical Anesthetic Applied: Other: lidocaine 4% Treatment Notes Electronic Signature(s) Signed: 01/07/2018 5:07:50 PM By: Curtis Sites Entered By: Curtis Sites on 01/07/2018 14:51:59 Jessica [name] ([id number]) -------------------------------------------------------------------------------- Multi-Disciplinary Care Plan Details [name]. Date of Service: 01/07/2018 2:45 PM Medical Record Number: [id number] Patient Account Number: [id number] Date of Birth/Sex: Sep 03, 1945 (72 y.o. F) Treating RN: Curtis Sites Primary Care Margery Szostak: Annita Brod Other Clinician: Referring Zandrea Kenealy: Annita Brod Treating Franceen Erisman/Extender: STONE III, HOYT Weeks in Treatment: 32 Active Inactive ` Orientation to the Wound Care Program Nursing Diagnoses: Knowledge deficit related to the wound healing center program Goals: Patient/caregiver will verbalize understanding of the Wound Healing Center Program Date Initiated: 06/03/2017 Target Resolution Date: 06/17/2017 Goal Status: Active Interventions: Provide education on orientation to  the wound center Notes: ` Pressure Nursing Diagnoses: Knowledge deficit related to causes and risk factors for pressure ulcer development Knowledge deficit related to management of pressures ulcers Potential for impaired tissue integrity related to pressure, friction, moisture, and shear Goals: Patient will remain free from development of additional pressure ulcers Date Initiated: 06/03/2017 Target Resolution Date: 06/17/2017 Goal Status: Active Patient will remain free of pressure ulcers Date Initiated: 06/03/2017 Target Resolution Date: 06/17/2017 Goal Status: Active Patient/caregiver will verbalize risk factors for pressure ulcer development Date Initiated: 06/03/2017 Target Resolution Date: 06/17/2017 Goal Status: Active Interventions: Assess: immobility, friction, shearing, incontinence upon admission and as needed Assess potential for pressure ulcer upon admission and as needed Notes: ` Wound/Skin Impairment LASHAN, MACIAS ([id number]) Nursing Diagnoses: Impaired tissue integrity Goals: Patient/caregiver will verbalize understanding of skin care regimen Date Initiated: 06/03/2017 Target Resolution Date: 06/17/2017 Goal Status: Active Ulcer/skin breakdown will have a volume reduction of 30% by week 4 Date Initiated: 06/03/2017 Target Resolution Date: 06/17/2017 Goal Status: Active Interventions: Assess ulceration(s) every visit Treatment Activities: Patient referred to home care : 06/03/2017 Skin care regimen initiated : 06/03/2017 Notes: Electronic Signature(s) Signed: 01/07/2018 5:07:50 PM By: Curtis Sites Entered By: Curtis Sites on 01/07/2018 14:51:51 Jessica [name] ([id number]) -------------------------------------------------------------------------------- Pain Assessment Details [name]. Date of Service: 01/07/2018 2:45 PM Medical Record Number: [id number] Patient Account Number: [id number] Date of Birth/Sex: February 22, 1946 (72 y.o.  F) Treating RN: Curtis Sites Primary Care Khiara Shuping: Annita Brod Other Clinician: Referring Beverlyn Mcginness: Annita Brod Treating Katelind Pytel/Extender: STONE III, HOYT Weeks in Treatment: 32 Active Problems Location of Pain Severity and Description of Pain Patient Has Paino No Site Locations Pain Management and Medication Current Pain Management: Electronic Signature(s) Signed: 01/07/2018 2:59:28 PM By: Dayton Martes RCP, RRT, CHT Signed: 01/07/2018 5:07:50 PM By: Curtis Sites Entered By: Dayton Martes on 01/07/2018 14:24:08 Jessica [name] ([id number]) -------------------------------------------------------------------------------- Patient/Caregiver Education Details [name]. Date of Service: 01/07/2018 2:45 PM Medical Record Number: [id number] Patient Account Number: [id number] Date of Birth/Gender: [birth year] (72 y.o. F) Treating RN: Curtis Sites Primary Care Physician: Annita Brod Other Clinician: Referring Physician: Annita Brod Treating Physician/Extender: Skeet Simmer in Treatment: 32 Education Assessment Education Provided To: Patient and Caregiver Education Topics Provided Wound/Skin Impairment: Handouts: Other: wound care as ordered Methods: Demonstration, Explain/Verbal Responses: State content correctly Electronic Signature(s) Signed: 01/07/2018 5:07:50 PM By: Curtis Sites Entered By: Curtis Sites on 01/07/2018 15:02:14 Jessica [name] ([id number]) -------------------------------------------------------------------------------- Wound Assessment Details [name]. Date of Service: 01/07/2018 2:45 PM Medical Record Number: [id number] Patient Account Number: [id number] Date of Birth/Sex:  1946-03-17 (72 y.o. F) Treating RN: Renne Crigler Primary Care Eddi Hymes: Annita Brod Other Clinician: Referring Tylisha Danis: Annita Brod Treating Gaye Scorza/Extender: STONE  III, HOYT Weeks in Treatment: 32 Wound Status Wound Number: 1 Primary Etiology: Pressure Ulcer Wound Location: Sacrum - Medial Wound Status: Open Wounding Event: Pressure Injury Date Acquired: 01/15/2017 Weeks Of Treatment: 32 Clustered Wound: No Photos Photo Uploaded By: Renne Crigler on 01/07/2018 16:14:29 Wound Measurements Length: (cm) 6.5 Width: (cm) 5.5 Depth: (cm) 1.5 Area: (cm) 28.078 Volume: (cm) 42.117 % Reduction in Area: 23.8% % Reduction in Volume: -14.3% Epithelialization: None Tunneling: No Undermining: Yes Starting Position (o'clock): 9 Ending Position (o'clock): 11 Maximum Distance: (cm) 3 Wound Description Classification: Category/Stage IV Wound Margin: Distinct, outline attached Exudate Amount: Large Exudate Type: Serosanguineous Exudate Color: red, brown Foul Odor After Cleansing: No Slough/Fibrino Yes Wound Bed Granulation Amount: Medium (34-66%) Exposed Structure Granulation Quality: Red, Pink, Hyper-granulation Fascia Exposed: No Necrotic Amount: Medium (34-66%) Fat Layer (Subcutaneous Tissue) Exposed: Yes Necrotic Quality: Eschar, Adherent Slough Tendon Exposed: No Muscle Exposed: Yes AASHRITHA, MIEDEMA. ([id number]) Necrosis of Muscle: No Joint Exposed: No Bone Exposed: Yes Periwound Skin Texture Texture Color No Abnormalities Noted: No No Abnormalities Noted: No Callus: No Atrophie Blanche: No Crepitus: No Cyanosis: No Excoriation: No Ecchymosis: No Induration: Yes Erythema: Yes Rash: No Erythema Location: Circumferential Scarring: Yes Hemosiderin Staining: No Mottled: No Moisture Pallor: No No Abnormalities Noted: No Rubor: Yes Dry / Scaly: No Maceration: No Temperature / Pain Temperature: No Abnormality Tenderness on Palpation: Yes Wound Preparation Ulcer Cleansing: Rinsed/Irrigated with Saline Topical Anesthetic Applied: Other: lidocaine 4%, Electronic Signature(s) Signed: 01/07/2018 4:22:48 PM By:  Renne Crigler Entered By: Renne Crigler on 01/07/2018 14:38:39 Jessica [name] ([id number]) -------------------------------------------------------------------------------- Vitals Details [name]. Date of Service: 01/07/2018 2:45 PM Medical Record Number: [id number] Patient Account Number: [id number] Date of Birth/Sex: 1946/03/12 (72 y.o. F) Treating RN: Curtis Sites Primary Care Farheen Pfahler: Annita Brod Other Clinician: Referring Tevis Conger: Annita Brod Treating Ragnar Waas/Extender: STONE III, HOYT Weeks in Treatment: 32 Vital Signs Time Taken: 14:20 Temperature (F): 98.2 Height (in): 70 Pulse (bpm): 67 Respiratory Rate (breaths/min): 16 Blood Pressure (mmHg): 122/53 Reference Range: 80 - 120 mg / dl Electronic Signature(s) Signed: 01/07/2018 2:59:28 PM By: Dayton Martes RCP, RRT, CHT Entered By: Dayton Martes on 01/07/2018 14:24:34

## 2018-01-17 NOTE — Telephone Encounter (Signed)
Error   Guadalupe C Hurtado, CMA   

## 2018-01-21 ENCOUNTER — Ambulatory Visit: Payer: Self-pay | Admitting: Physician Assistant

## 2018-01-28 ENCOUNTER — Ambulatory Visit: Payer: Self-pay | Admitting: Physician Assistant

## 2018-02-04 ENCOUNTER — Encounter: Payer: Medicare Other | Attending: Physician Assistant | Admitting: Physician Assistant

## 2018-02-04 ENCOUNTER — Other Ambulatory Visit
Admission: RE | Admit: 2018-02-04 | Discharge: 2018-02-04 | Disposition: A | Discharge status: Home / Self Care | Payer: Medicare Other | Source: Ambulatory Visit | Attending: Physician Assistant | Admitting: Physician Assistant

## 2018-02-04 DIAGNOSIS — L89154 Pressure ulcer of sacral region, stage 4: Secondary | ICD-10-CM | POA: Diagnosis not present

## 2018-02-04 DIAGNOSIS — B998 Other infectious disease: Secondary | ICD-10-CM | POA: Insufficient documentation

## 2018-02-06 NOTE — Progress Notes (Addendum)
Jessica, [name] ([id number]) Visit Report for 02/04/2018 Chief Complaint Document Details Patient Name: Jessica, [name]. Date of Service: 02/04/2018 2:45 PM Medical Record Number: [id number] Patient Account Number: [id number] Date of Birth/Sex: July 21, 1945 (72 y.o. F) Treating RN: Curtis Sites Primary Care Provider: Annita Brod Other Clinician: Referring Provider: Annita Brod Treating Provider/Extender: STONE III, HOYT Weeks in Treatment: 36 Information Obtained from: Patient Chief Complaint she is here for evaluation of a sacral ulcer and bilateral lower extremity ulcers Electronic Signature(s) Signed: 02/04/2018 6:09:48 PM By: Lenda Kelp PA-C Entered By: Lenda Kelp on 02/04/2018 15:05:26 Jessica [name] ([id number]) -------------------------------------------------------------------------------- HPI Details [name]. Date of Service: 02/04/2018 2:45 PM Medical Record Number: [id number] Patient Account Number: [id number] Date of Birth/Sex: [birth year] (72 y.o. F) Treating RN: Curtis Sites Primary Care Provider: Annita Brod Other Clinician: Referring Provider: Annita Brod Treating Provider/Extender: Linwood Dibbles, HOYT Weeks in Treatment: 36 History of Present Illness HPI Description: 05/27/17-she is here in initial evaluation for a left-sided sacral stage IV pressure ulcer and bilateral lower extremity, lateral aspect, unstageable pressure ulcers. She is accompanied by her husband and her son, who are her primary caregivers. She is bedbound secondary to spinal stenosis. According to her son and husband she was hospitalized from 10/5-11/1 with healthcare associated pneumonia and altered mental status. During her hospitalization she was intubated, extubated on 10/27. She was discharged with Foley catheter and follow up with urology. According to her son and spouse she developed the sacral ulcer during hospitalization. Home health has  been applying Santyl daily. She does have a low air loss mattress and is repositioned every 2-3 hours per her family's report. According to the son and spouse she had an appointment with urology on 12/18 and during that appointment developed discoloration to her bilateral lower extremities which ultimately developed into unstageable pressure ulcers to the lateral aspects of her bilateral lower extremities. There has been no topical treatment applied to these. She continues to have home health. There is no concerns expressed regarding dietary intake, stating she eats 3 meals a day, eating was provided; she is supplemented with boost with protein. 06/03/17-she is here in follow-up evaluation for sacral and bilateral lower extremity ulcers. Plain film x-ray done today reveals no distraction to the sacrum or coccyx, no visible abnormalities. Home health has ordered the negative pressure wound system but it has not been initiated. We will continue with Hydrofera Blue until initiation of negative pressure wound system and continue with Santyl to bilateral lower extremity ulcers. Follow-up next week 06/10/17-she is here in follow-up evaluation for sacral and bilateral lower extremity ulcers. The wound VAC will be available tomorrow per home health. We will initiate wound VAC therapy to the sacral ulcer 3 times weekly (Thursday, Saturday/Sunday, Tuesday). We will continue with Santyl to the lower extremity ulcers. The patient's son is checking into home health therapy over the weekend for [facility] changes, with the understanding that if VAC changes cannot be performed over the weekend he will need to change his mother's appointments to Monday, Wednesday or Friday. The sacral ulcer clinically does not appear infected but there has been a change in the amount of drainage acutely, there is no significant amount of devitalized tissue, there is no malodor. Wound culture was obtained to evaluate for occult infection we  will hold off on antibiotic therapy until sensitivities are resulted. 06/18/17 on evaluation today patient appears to be doing fairly well in my opinion although this is the first time I  have seen this patient she has been previously evaluated by Tacey RuizLeah here in our office. She is going to be switching to Fridays to see me due to the Wound VAC schedule being changed on Monday, Wednesday, and Friday. Subsequently she seems to be doing fairly well with the Wound VAC. Her son who was present during evaluation today states that he somewhat stresses of the Wound VAC in making sure that it was functioning appropriately. With that being said everything seems to be working well he knows what settings on the [facility] HVAC itself to look at and ensure that it is functioning properly. Overall the wound appears to be nice and clean there is no need for debridement today. She has no discomfort in her bilateral lower extremity ulcers also appear to be improving based on measurements and what this honest tell me about the overall appearance. 07/02/17 on evaluation today I noted in patients wound bed that was actually an odor that had not previously been noted. Subsequently there was also a small area of bone that was not previously noted during my last evaluation. This did appear to be necrotic and was being somewhat forced out by the body around the region of granulation. She does not have any pain which is good news. Nonetheless the overall appearance of the ulcer is making me concerned for the patient having developed osteomyelitis. Currently she is not been on any antibiotics and the Wound VAC has been doing fairly well in general. With that being said I do not think we need to continue the Wound VAC if she potentially has a bone infection. At least not until it is properly addressed with antibiotics. 07/09/17 on evaluation today patient appears to actually be doing very well in regard to her sacral ulcer compared to last  week. There is actually no exposed bone at this point. 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 With that being said still I do believe she needs to have a referral to infectious disease due to the osteomyelitis I am gonna recommend that she continue with the doxycycline based on the culture results which showed Eikenella Corrodens as the Jessica BradyOVERMAN, [name] ([id number]) organism identified that tetracycline should work for. She does not seem to be having any discomfort whatsoever at this point. 07/16/17 on evaluation today patient actually appears to be doing rather well in regard to her sacral wound. I think that the original wound site actually appears much better than previously noted. With that being said she does have a new superficial injury on the right sacral region which appears to be due to according to the sign transport that occurred for her MRI unfortunately. Fortunately this is not too deep and I do think it can be managed but it was a new area that was not previously noted. Otherwise she has been tolerating the Dakinos soaked gauze packing without complication. 07/30/17 on evaluation today patient presents for reevaluation concerning her sacral wound. She has been tolerating the dressing changes without complication. With that being said her wound is doing so well at this point that I think she may be at the point where we could reinitiate the Wound VAC currently and hopefully see good results from this. I'm very pleased with how she has responded to the Dakinos soaked gauze packing. 08/13/17 evaluation today patient appears to be doing excellent in  regard to her lower extremity ulcer this seems to be cleaning up very nicely. In regard to the sacral ulcer the area of trauma on the right lateral portion of the wound actually appears  to be much better than previously noted during the last evaluation. The word has definitely filled in and the Wound VAC appears to be helpful I do believe. Overall I'm very pleased with how things seem to be progressing at this time. Patient likewise is also happy that things are doing well. 08/27/17 on evaluation today patient presents for follow-up concerning her ongoing issues with her sacral ulcer and lower extremity ulcer. Fortunately she has been tolerating the dressing changes well complication the Wound VAC in general seems to have done very well up to this point. She does not have any evidence of infection which is good news. She does have some dark discoloration in central portion of the wound which is troublesome for the possibility of pressure injury to the site it sounds like her prior air mattress was not functioning properly her son has just bought a new one for her this is doing much better. Otherwise things seem to be progressing nicely. 09/10/17 on evaluation today patient presents for follow-up concerning her sacral wound and lower extremity ulcer. She has been tolerating the switch of her dressing changes to the Dakinos soaked gauze dressing very well in regard to the sacrum. The left lateral lower extremity ulcer appears to have a new area open just distal to the one that we have been treating with Santyl and this is new since her last visit. There does not appear to be any evidence of infection which is good news. With that being said there's a lot of maceration here at the site and I feel this is likely due to the switch and the dressings it appears that due to the sticking of the dressings it will switch to utilizing a telfa pad over the Santyl. I think this caused a lot of drainage to be collected and situated in the region just under the dressing which has led to this causing which duration of breakdown. Overall there does not appear to be any significant pain which is good  news. Of note I did actually have a conversation with the radiologist who is an interventional radiologist with [facility]. 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 With that being said there does not appear to be any rush to get this done according to the interventional radiologist whom I spoke with. Therefore after discussion it was determined that there gonna wait about six months prior to considering the procedure to get time for hopefully the sacral wound to heal in the interim to at least some degree. 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 According to nursing staff these are all improved, the negative pressure wound therapy system was put on hold last week. We will resume negative pressure wound therapy today, continue with Santyl to the lower extremity and she will follow-up next week. 09/24/17 on evaluation today unfortunately the patient's wound appears to be doing significantly worse compared to last week's evaluation and even my valuation the week before. She actually has bone noted on the left wound margin in the region of undermining unfortunately. This was not noted during the last evaluation. I am concerned about the fact that this seems to be worse not better since  our last visit with her. My biggest concern is that she's likely developing a worsening osteomyelitis. This was discussed with patient and her son today during the office visit. 05/03/17 on evaluation today patient actually appears to be doing rather well in regard to her sacral ulcer compared to last week's evaluation. She has been tolerating the dressing changes without complication. Fortunately even though we're not utilizing the Wound VAC she seems to be making some strides in seeing the wound area overall improved quite dramatically in my pinion in just one weeks time. She also seems to  be staying off of this to the point that I do not see any evidence of new injury which is excellent news. Whatever she's been doing in that regard over the past week I would want her to continue. I did also review the results of her bone culture which revealed group B strep as the responsible organism. Again this is what's causing her osteomyelitis she does have infectious disease appointment scheduled for 10/11/17 10/08/1898 valuation today patient appears to be doing better for the most part in regard to the sacral wound. Overall she is showing signs of having granulation of the bone which is good news. There is an area towards the left of the wound where she does seem to be showing some signs of opening this it would be due to additional pressure to the area unfortunately. Jessica [name], Jessica [name]. ([id number]) There does not appear to be any evidence of infection spreading which is good news that do not see any evidence of significant purulent discharge which is also good news. 10/26/17 on evaluation today patient sacral ulcer actually appears to be very healthy and doing well in regard to the overall appearance of the wound. With that being said she does seem to be having some issues at this point in time with some shear/friction injury of the sacrum from where she was transported to [facility] for her infectious disease appointment. She has been placed on IV antibiotic therapy I will have to go back and find that note for review as I did not have access to that today. Nonetheless I will add the next visit be sure to document the exact antibiotics that she is taking at this point. Nonetheless I do believe the wound is doing better there's no bone exposure nothing that requires debridement in regard to the sacral wound. Likewise her left lateral lower extremity ulcer also appears to be doing significantly better. In general I feel like things are showing signs of improvement all around. 11/12/17 on  evaluation today patient appears to be doing rather well in regard to her sacral wound. In general I do see signs of granulation at this point. Fortunately there does not appear to be any evidence of infection which is good news as well. In general overall happy with the appearance. With that being said I'll be see I would like for this to be progressing more rapidly as with the patient and her son but nonetheless at least things do not appear to be doing worse. Her lower extremity ulcer is doing significantly better. 11/26/17 on evaluation today patient appears to be doing a little better in regard to the sacral wound especially in the peripheral of the wound bed. She actually did have an abrasion on the right gluteal region that's completely resolved to this point. Her lower extremity also appears to be for the most part completely resolved. Overall the sacrum itself seems to be the one thing that is  still open and given her trouble. No fevers, chills, nausea, or vomiting noted at this time. She actually has an appointment next week with infectious disease for recheck to see how things are doing. She maybe switch to oral medications at that point. 12/09/17 on evaluation today patient actually appears to be doing much better in regard to her sacral wound. I do feel like she has made good progress at this time as far as healing is concerned. In fact the wound looks better to me today than it has in quite some time. She is on oral antibiotic therapy and no longer on the IV antibiotics. She did see infectious disease last week. 12/24/17 on evaluation today patient's wounds actually appear to be doing decently well in regard to the sacral wound in particular. When it comes to her lower extremity ulcers both appear to be completely healed at this point in the one area where she had lived the rash has completely resolved at this time. Overall very pleased with the progress that has been made. Nonetheless  the patient seems to be doing well in general. She still does have quite a bit of healing to do in regard to the sacral wound but I feel like she is making progress. 01/07/18 on evaluation today patient actually appears to be doing excellent today regard to her sacral wound. The granular quality in the base of the wound is excellent and shown signs of good improvement there's no evidence of contusion nor deep tissue injury and the periwound actually appears to be doing well. In general I'm very pleased with the overall appearance. 02/04/18 on evaluation today patient's wound actually appears to be doing decently well as far as the granulation is concerned. She has been tolerating the dressing changes without complication. Right now we are still using the Wound VAC. Nonetheless overall there apparently is some cater to the wound and this is concerning simply for the fact that she could be developing a soft tissue infection again. She's had this happen in the past and at that time responded very well to the antibiotics. Nonetheless I don't think I would likely put her on anything prophylactically but rather wait for the results of the culture to return. Electronic Signature(s) Signed: 02/04/2018 6:09:48 PM By: Lenda Kelp PA-C Entered By: Lenda Kelp on 02/04/2018 16:16:34 Jessica [name] ([id number]) -------------------------------------------------------------------------------- Physical Exam Details [name]. Date of Service: 02/04/2018 2:45 PM Medical Record Number: [id number] Patient Account Number: [id number] Date of Birth/Sex: 03/19/1946 (72 y.o. F) Treating RN: Curtis Sites Primary Care Provider: Annita Brod Other Clinician: Referring Provider: Annita Brod Treating Provider/Extender: STONE III, HOYT Weeks in Treatment: 36 Constitutional Well-nourished and well-hydrated in no acute distress. Respiratory normal breathing without difficulty.  clear to auscultation bilaterally. Cardiovascular regular rate and rhythm with normal S1, S2. Psychiatric this patient is able to make decisions and demonstrates good insight into disease process. Alert and Oriented x 3. pleasant and cooperative. Notes Patient's wound bed currently shows good granulation again. Again there was some older this was noted by others during the evaluation today. Nonetheless I do not see any significant signs of overt infection although she could be significantly colonized at this time. Either way I think that we may want to do a culture and that there is a lot of abundant bacteria a short course of oral antibiotics might be beneficial for her. No sharp debridement was required today. Electronic Signature(s) Signed: 02/04/2018 6:09:48 PM By: Lenda Kelp  PA-C Entered By: Lenda Kelp on 02/04/2018 16:17:25 Jessica [name] ([id number]) -------------------------------------------------------------------------------- Physician Orders Details [name]. Date of Service: 02/04/2018 2:45 PM Medical Record Number: [id number] Patient Account Number: [id number] Date of Birth/Sex: 1945-10-05 (72 y.o. F) Treating RN: Curtis Sites Primary Care Provider: Annita Brod Other Clinician: Referring Provider: Annita Brod Treating Provider/Extender: Linwood Dibbles, HOYT Weeks in Treatment: 28 Verbal / Phone Orders: No Diagnosis Coding ICD-10 Coding Code Description L89.154 Pressure ulcer of sacral region, stage 4 M48.00 Spinal stenosis, site unspecified R54 Age-related physical debility G89.4 Chronic pain syndrome E78.2 Mixed hyperlipidemia L89.93 Pressure ulcer of unspecified site, stage 3 Wound Cleansing Wound #1 Medial Sacrum o Other: - please cleanse sacral wound with dakins moistened gauze - do not spray dakins on wound Anesthetic (add to Medication List) Wound #1 Medial Sacrum o Topical Lidocaine 4% cream applied to wound bed  prior to debridement (In Clinic Only). Skin Barriers/Peri-Wound Care Wound #1 Medial Sacrum o Skin Prep Primary Wound Dressing Wound #1 Medial Sacrum o Pack wound with: - Dakin's soaked gauze in clinic Secondary Dressing Wound #1 Medial Sacrum o ABD pad - secure with tape use until wound vac is placed back on wound Dressing Change Frequency Wound #1 Medial Sacrum o Change Dressing Monday, Wednesday, Friday Follow-up Appointments Wound #1 Medial Sacrum o Return Appointment in 2 weeks. Off-Loading Wound #1 Medial Sacrum Jessica [name], Jessica [name]. ([id number]) o Turn and reposition every 2 hours Additional Orders / Instructions Wound #1 Medial Sacrum o Increase protein intake. Home Health Wound #1 Medial Sacrum o Continue Home Health Visits - Advanced HHRN to start wound vac o Home Health Nurse may visit PRN to address patientos wound care needs. o FACE TO FACE ENCOUNTER: MEDICARE and MEDICAID PATIENTS: I certify that this patient is under my care and that I had a face-to-face encounter that meets the physician face-to-face encounter requirements with this patient on this date. The encounter with the patient was in whole or in part for the following MEDICAL CONDITION: (primary reason for Home Healthcare) MEDICAL NECESSITY: I certify, that based on my findings, NURSING services are a medically necessary home health service. HOME BOUND STATUS: I certify that my clinical findings support that this patient is homebound (i.e., Due to illness or injury, pt requires aid of supportive devices such as crutches, cane, wheelchairs, walkers, the use of special transportation or the assistance of another person to leave their place of residence. There is a normal inability to leave the home and doing so requires considerable and taxing effort. Other absences are for medical reasons / religious services and are infrequent or of short duration when for other reasons). o If current  dressing causes regression in wound condition, may D/C ordered dressing product/s and apply Normal Saline Moist Dressing daily until next Wound Healing Center / Other MD appointment. Notify Wound Healing Center of regression in wound condition at [phone]. o Please direct any NON-WOUND related issues/requests for orders to patient's Primary Care Physician Negative Pressure Wound Therapy Wound #1 Medial Sacrum o Wound VAC settings at 125/130 mmHg continuous pressure. Use BLACK/GREEN foam to wound cavity. Use WHITE foam to fill any tunnel/s and/or undermining. Change VAC dressing 3 X WEEK. Change canister as indicated when full. Nurse may titrate settings and frequency of dressing changes as clinically indicated. - HHRN to start wound vac HHRN to change on Mondays, Wednesdays and Fridays Please use plain collagen under NPWT o Home Health Nurse may d/c VAC for s/s of increased infection,  significant wound regression, or uncontrolled drainage. Notify Wound Healing Center at [phone]. o Number of foam/gauze pieces used in the dressing = Laboratory o Bacteria identified in Wound by Culture (MICRO) oooo LOINC Code: 6462-6 oooo Convenience Name: Wound culture routine Patient Medications Allergies: zinc, Ambien Notifications Medication Indication Start End Bactrim DS 02/08/2018 DOSE 1 - oral 800 mg-160 mg tablet - 1 tablet oral taken 2 times a day for 14 days Jessica [name], Jessica [name] ([id number]) Electronic Signature(s) Signed: 02/08/2018 3:28:43 PM By: Lenda Kelp PA-C Previous Signature: 02/04/2018 5:35:44 PM Version By: Curtis Sites Previous Signature: 02/04/2018 6:09:48 PM Version By: Lenda Kelp PA-C Entered By: Lenda Kelp on 02/08/2018 15:28:43 Jessica [name] ([id number]) -------------------------------------------------------------------------------- Problem List Details [name]. Date of Service: 02/04/2018 2:45 PM Medical  Record Number: [id number] Patient Account Number: [id number] Date of Birth/Sex: 07-01-1945 (72 y.o. F) Treating RN: Curtis Sites Primary Care Provider: Annita Brod Other Clinician: Referring Provider: Annita Brod Treating Provider/Extender: STONE III, HOYT Weeks in Treatment: 36 Active Problems ICD-10 Evaluated Encounter Code Description Active Date Today Diagnosis L89.154 Pressure ulcer of sacral region, stage 4 05/27/2017 No Yes M48.00 Spinal stenosis, site unspecified 05/27/2017 No Yes R54 Age-related physical debility 05/27/2017 No Yes G89.4 Chronic pain syndrome 05/27/2017 No Yes E78.2 Mixed hyperlipidemia 05/27/2017 No Yes L89.93 Pressure ulcer of unspecified site, stage 3 05/27/2017 No Yes Inactive Problems Resolved Problems Electronic Signature(s) Signed: 02/04/2018 6:09:48 PM By: Lenda Kelp PA-C Entered By: Lenda Kelp on 02/04/2018 15:05:19 Jessica [name] ([id number]) -------------------------------------------------------------------------------- Progress Note Details [name]. Date of Service: 02/04/2018 2:45 PM Medical Record Number: [id number] Patient Account Number: [id number] Date of Birth/Sex: [birth year] (72 y.o. F) Treating RN: Curtis Sites Primary Care Provider: Annita Brod Other Clinician: Referring Provider: Annita Brod Treating Provider/Extender: Linwood Dibbles, HOYT Weeks in Treatment: 36 Subjective Chief Complaint Information obtained from Patient she is here for evaluation of a sacral ulcer and bilateral lower extremity ulcers History of Present Illness (HPI) 05/27/17-she is here in initial evaluation for a left-sided sacral stage IV pressure ulcer and bilateral lower extremity, lateral aspect, unstageable pressure ulcers. She is accompanied by her husband and her son, who are her primary caregivers. She is bedbound secondary to spinal stenosis. According to her son and husband she was hospitalized from 10/5-11/1  with healthcare associated pneumonia and altered mental status. During her hospitalization she was intubated, extubated on 10/27. She was discharged with Foley catheter and follow up with urology. According to her son and spouse she developed the sacral ulcer during hospitalization. Home health has been applying Santyl daily. She does have a low air loss mattress and is repositioned every 2-3 hours per her family's report. According to the son and spouse she had an appointment with urology on 12/18 and during that appointment developed discoloration to her bilateral lower extremities which ultimately developed into unstageable pressure ulcers to the lateral aspects of her bilateral lower extremities. There has been no topical treatment applied to these. She continues to have home health. There is no concerns expressed regarding dietary intake, stating she eats 3 meals a day, eating was provided; she is supplemented with boost with protein. 06/03/17-she is here in follow-up evaluation for sacral and bilateral lower extremity ulcers. Plain film x-ray done today reveals no distraction to the sacrum or coccyx, no visible abnormalities. Home health has ordered the negative pressure wound system but it has not been initiated. We will continue with Hydrofera Blue until initiation of  negative pressure wound system and continue with Santyl to bilateral lower extremity ulcers. Follow-up next week 06/10/17-she is here in follow-up evaluation for sacral and bilateral lower extremity ulcers. The wound VAC will be available tomorrow per home health. We will initiate wound VAC therapy to the sacral ulcer 3 times weekly (Thursday, Saturday/Sunday, Tuesday). We will continue with Santyl to the lower extremity ulcers. The patient's son is checking into home health therapy over the weekend for [facility] changes, with the understanding that if VAC changes cannot be performed over the weekend he will need to change his  mother's appointments to Monday, Wednesday or Friday. The sacral ulcer clinically does not appear infected but there has been a change in the amount of drainage acutely, there is no significant amount of devitalized tissue, there is no malodor. Wound culture was obtained to evaluate for occult infection we will hold off on antibiotic therapy until sensitivities are resulted. 06/18/17 on evaluation today patient appears to be doing fairly well in my opinion although this is the first time I have seen this patient she has been previously evaluated by Tacey Ruiz here in our office. She is going to be switching to Fridays to see me due to the Wound VAC schedule being changed on Monday, Wednesday, and Friday. Subsequently she seems to be doing fairly well with the Wound VAC. Her son who was present during evaluation today states that he somewhat stresses of the Wound VAC in making sure that it was functioning appropriately. With that being said everything seems to be working well he knows what settings on the [facility] itself to look at and ensure that it is functioning properly. Overall the wound appears to be nice and clean there is no need for debridement today. She has no discomfort in her bilateral lower extremity ulcers also appear to be improving based on measurements and what this honest tell me about the overall appearance. 07/02/17 on evaluation today I noted in patients wound bed that was actually an odor that had not previously been noted. Subsequently there was also a small area of bone that was not previously noted during my last evaluation. This did appear to be necrotic and was being somewhat forced out by the body around the region of granulation. She does not have any pain which is good news. Nonetheless the overall appearance of the ulcer is making me concerned for the patient having developed osteomyelitis. Currently she is not been on any antibiotics and the Wound VAC has been doing fairly well  in general. With that being said I do not think we need to continue the Wound VAC if she potentially has a bone infection. At least not until it is properly addressed with antibiotics. Jessica [name], Jessica [name] ([id number]) 07/09/17 on evaluation today patient appears to actually be doing very well in regard to her sacral ulcer compared to last week. There is actually no exposed bone at this point. 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 With that being said still I do believe she needs to have a referral to infectious disease due to the osteomyelitis I am gonna recommend that she continue with the doxycycline based on the culture results which showed Eikenella Corrodens as the organism identified that tetracycline should work for. She does not seem to be having any discomfort whatsoever at this point. 07/16/17 on evaluation today  patient actually appears to be doing rather well in regard to her sacral wound. I think that the original wound site actually appears much better than previously noted. With that being said she does have a new superficial injury on the right sacral region which appears to be due to according to the sign transport that occurred for her MRI unfortunately. Fortunately this is not too deep and I do think it can be managed but it was a new area that was not previously noted. Otherwise she has been tolerating the Dakin s soaked gauze packing without complication. 07/30/17 on evaluation today patient presents for reevaluation concerning her sacral wound. She has been tolerating the dressing changes without complication. With that being said her wound is doing so well at this point that I think she may be at the point where we could reinitiate the Wound VAC currently and hopefully see good results from this. I'm very pleased  with how she has responded to the [facility] s soaked gauze packing. 08/13/17 evaluation today patient appears to be doing excellent in regard to her lower extremity ulcer this seems to be cleaning up very nicely. In regard to the sacral ulcer the area of trauma on the right lateral portion of the wound actually appears to be much better than previously noted during the last evaluation. The word has definitely filled in and the Wound VAC appears to be helpful I do believe. Overall I'm very pleased with how things seem to be progressing at this time. Patient likewise is also happy that things are doing well. 08/27/17 on evaluation today patient presents for follow-up concerning her ongoing issues with her sacral ulcer and lower extremity ulcer. Fortunately she has been tolerating the dressing changes well complication the Wound VAC in general seems to have done very well up to this point. She does not have any evidence of infection which is good news. She does have some dark discoloration in central portion of the wound which is troublesome for the possibility of pressure injury to the site it sounds like her prior air mattress was not functioning properly her son has just bought a new one for her this is doing much better. Otherwise things seem to be progressing nicely. 09/10/17 on evaluation today patient presents for follow-up concerning her sacral wound and lower extremity ulcer. She has been tolerating the switch of her dressing changes to the Dakin s soaked gauze dressing very well in regard to the sacrum. The left lateral lower extremity ulcer appears to have a new area open just distal to the one that we have been treating with Santyl and this is new since her last visit. There does not appear to be any evidence of infection which is good news. With that being said there's a lot of maceration here at the site and I feel this is likely due to the switch and the dressings it appears that due to the  sticking of the dressings it will switch to utilizing a telfa pad over the Santyl. I think this caused a lot of drainage to be collected and situated in the region just under the dressing which has led to this causing which duration of breakdown. Overall there does not appear to be any significant pain which is good news. Of note I did actually have a conversation with the radiologist who is an interventional radiologist with [facility]. 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 With that being said there does not appear to be any rush to get this done according to the interventional radiologist whom I spoke with. Therefore after discussion it was determined that there gonna wait about six months prior to considering the procedure to get time for hopefully the sacral wound to heal in the interim to at least some degree. 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 According to nursing staff these are all improved, the negative pressure wound therapy system was put on hold last week. We will resume negative pressure wound therapy today, continue with Santyl to the lower extremity and she will follow-up next week. 09/24/17 on evaluation today unfortunately the patient's wound appears to be doing significantly worse compared to last week's evaluation and even my valuation the week before. She actually has bone noted on the left wound margin in the region of undermining unfortunately. This was not noted during the last evaluation. I am concerned about the fact that this seems to be worse not better since our last visit with her. My biggest concern is that she's likely developing a worsening osteomyelitis. This was discussed with patient and her son today during the office visit. 05/03/17 on evaluation today patient actually appears to be doing rather well in regard to her sacral  ulcer compared to last week's evaluation. She has been tolerating the dressing changes without complication. Fortunately even though we're not utilizing the Wound VAC she seems to be making some strides in seeing the wound area overall improved quite dramatically in my pinion in just one weeks time. She also seems to be staying off of this to the point that I do not see any evidence of new injury which is excellent news. Whatever she's been doing in that regard over the past week I would want her to continue. Jessica [name], Jessica [name] ([id number]) did also review the results of her bone culture which revealed group B strep as the responsible organism. Again this is what's causing her osteomyelitis she does have infectious disease appointment scheduled for 10/11/17 10/08/1898 valuation today patient appears to be doing better for the most part in regard to the sacral wound. Overall she is showing signs of having granulation of the bone which is good news. There is an area towards the left of the wound where she does seem to be showing some signs of opening this it would be due to additional pressure to the area unfortunately. There does not appear to be any evidence of infection spreading which is good news that do not see any evidence of significant purulent discharge which is also good news. 10/26/17 on evaluation today patient sacral ulcer actually appears to be very healthy and doing well in regard to the overall appearance of the wound. With that being said she does seem to be having some issues at this point in time with some shear/friction injury of the sacrum from where she was transported to [facility] for her infectious disease appointment. She has been placed on IV antibiotic therapy I will have to go back and find that note for review as I did not have access to that today. Nonetheless I will add the next visit be sure to document the exact antibiotics that she is taking at this  point. Nonetheless I do believe the wound is doing better there's no bone exposure nothing that requires debridement in regard to the sacral wound. Likewise her left lateral lower extremity ulcer also  appears to be doing significantly better. In general I feel like things are showing signs of improvement all around. 11/12/17 on evaluation today patient appears to be doing rather well in regard to her sacral wound. In general I do see signs of granulation at this point. Fortunately there does not appear to be any evidence of infection which is good news as well. In general overall happy with the appearance. With that being said I'll be see I would like for this to be progressing more rapidly as with the patient and her son but nonetheless at least things do not appear to be doing worse. Her lower extremity ulcer is doing significantly better. 11/26/17 on evaluation today patient appears to be doing a little better in regard to the sacral wound especially in the peripheral of the wound bed. She actually did have an abrasion on the right gluteal region that's completely resolved to this point. Her lower extremity also appears to be for the most part completely resolved. Overall the sacrum itself seems to be the one thing that is still open and given her trouble. No fevers, chills, nausea, or vomiting noted at this time. She actually has an appointment next week with infectious disease for recheck to see how things are doing. She maybe switch to oral medications at that point. 12/09/17 on evaluation today patient actually appears to be doing much better in regard to her sacral wound. I do feel like she has made good progress at this time as far as healing is concerned. In fact the wound looks better to me today than it has in quite some time. She is on oral antibiotic therapy and no longer on the IV antibiotics. She did see infectious disease last week. 12/24/17 on evaluation today patient's wounds  actually appear to be doing decently well in regard to the sacral wound in particular. When it comes to her lower extremity ulcers both appear to be completely healed at this point in the one area where she had lived the rash has completely resolved at this time. Overall very pleased with the progress that has been made. Nonetheless the patient seems to be doing well in general. She still does have quite a bit of healing to do in regard to the sacral wound but I feel like she is making progress. 01/07/18 on evaluation today patient actually appears to be doing excellent today regard to her sacral wound. The granular quality in the base of the wound is excellent and shown signs of good improvement there's no evidence of contusion nor deep tissue injury and the periwound actually appears to be doing well. In general I'm very pleased with the overall appearance. 02/04/18 on evaluation today patient's wound actually appears to be doing decently well as far as the granulation is concerned. She has been tolerating the dressing changes without complication. Right now we are still using the Wound VAC. Nonetheless overall there apparently is some cater to the wound and this is concerning simply for the fact that she could be developing a soft tissue infection again. She's had this happen in the past and at that time responded very well to the antibiotics. Nonetheless I don't think I would likely put her on anything prophylactically but rather wait for the results of the culture to return. Patient History Information obtained from Patient. Family History No family history of Cancer, Diabetes, Heart Disease. Jessica [name], Jessica [name] ([id number]) Social History Never smoker, Marital Status - Married. Review of Systems (ROS) Constitutional Symptoms (General  Health) Denies complaints or symptoms of Fever, Chills, Marked Weight Change. Respiratory The patient has no complaints or symptoms. Cardiovascular The  patient has no complaints or symptoms. Psychiatric The patient has no complaints or symptoms. Objective Constitutional Well-nourished and well-hydrated in no acute distress. Vitals Time Taken: 2:47 PM, Height: 70 in, Temperature: 98.1 F, Pulse: 77 bpm, Respiratory Rate: 16 breaths/min, Blood Pressure: 132/59 mmHg. Respiratory normal breathing without difficulty. clear to auscultation bilaterally. Cardiovascular regular rate and rhythm with normal S1, S2. Psychiatric this patient is able to make decisions and demonstrates good insight into disease process. Alert and Oriented x 3. pleasant and cooperative. General Notes: Patient's wound bed currently shows good granulation again. Again there was some older this was noted by others during the evaluation today. Nonetheless I do not see any significant signs of overt infection although she could be significantly colonized at this time. Either way I think that we may want to do a culture and that there is a lot of abundant bacteria a short course of oral antibiotics might be beneficial for her. No sharp debridement was required today. Integumentary (Hair, Skin) Wound #1 status is Open. Original cause of wound was Pressure Injury. The wound is located on the Medial Sacrum. The wound measures 7.5cm length x 4cm width x 2.3cm depth; 23.562cm^2 area and 54.192cm^3 volume. There is bone, muscle, and Fat Layer (Subcutaneous Tissue) Exposed exposed. There is undermining starting at 9:00 and ending at 12:00 with a maximum distance of 3cm. There is a large amount of serosanguineous drainage noted. Foul odor after cleansing was noted. The wound margin is distinct with the outline attached to the wound base. There is medium (34-66%) red, pink, hyper - granulation within the wound bed. There is a medium (34-66%) amount of necrotic tissue within the wound bed including Eschar and Adherent Slough. The periwound skin appearance exhibited: Induration,  Scarring, Rubor, Erythema. The periwound skin appearance did not exhibit: Callus, Crepitus, Excoriation, Rash, Dry/Scaly, Maceration, Atrophie Blanche, Cyanosis, Ecchymosis, Hemosiderin Staining, Mottled, Pallor. The surrounding wound skin color is noted with erythema which is circumferential. Periwound temperature was noted as No Abnormality. The periwound has tenderness on palpation. Jessica [name], Jessica [name] ([id number]) Assessment Active Problems ICD-10 Pressure ulcer of sacral region, stage 4 Spinal stenosis, site unspecified Age-related physical debility Chronic pain syndrome Mixed hyperlipidemia Pressure ulcer of unspecified site, stage 3 Plan Wound Cleansing: Wound #1 Medial Sacrum: Other: - please cleanse sacral wound with dakins moistened gauze - do not spray dakins on wound Anesthetic (add to Medication List): Wound #1 Medial Sacrum: Topical Lidocaine 4% cream applied to wound bed prior to debridement (In Clinic Only). Skin Barriers/Peri-Wound Care: Wound #1 Medial Sacrum: Skin Prep Primary Wound Dressing: Wound #1 Medial Sacrum: Pack wound with: - Dakin's soaked gauze in clinic Secondary Dressing: Wound #1 Medial Sacrum: ABD pad - secure with tape use until wound vac is placed back on wound Dressing Change Frequency: Wound #1 Medial Sacrum: Change Dressing Monday, Wednesday, Friday Follow-up Appointments: Wound #1 Medial Sacrum: Return Appointment in 2 weeks. Off-Loading: Wound #1 Medial Sacrum: Turn and reposition every 2 hours Additional Orders / Instructions: Wound #1 Medial Sacrum: Increase protein intake. Home Health: Wound #1 Medial Sacrum: Continue Home Health Visits - Advanced HHRN to start wound vac Home Health Nurse may visit PRN to address patient s wound care needs. FACE TO FACE ENCOUNTER: MEDICARE and MEDICAID PATIENTS: I certify that this patient is under my care and that I had a face-to-face encounter that meets the  physician face-to-face encounter  requirements with this patient on this date. The encounter with the patient was in whole or in part for the following MEDICAL CONDITION: (primary reason for Home Healthcare) MEDICAL NECESSITY: I certify, that based on my findings, NURSING services are a medically necessary home health service. HOME BOUND STATUS: I certify that my clinical findings support that this patient is homebound (i.e., Due to illness or injury, pt requires aid of supportive devices such as crutches, cane, wheelchairs, walkers, the use of special Jessica [name], Jessica [name]. ([id number]) transportation or the assistance of another person to leave their place of residence. There is a normal inability to leave the home and doing so requires considerable and taxing effort. Other absences are for medical reasons / religious services and are infrequent or of short duration when for other reasons). If current dressing causes regression in wound condition, may D/C ordered dressing product/s and apply Normal Saline Moist Dressing daily until next Wound Healing Center / Other MD appointment. Notify Wound Healing Center of regression in wound condition at [phone]. Please direct any NON-WOUND related issues/requests for orders to patient's Primary Care Physician Negative Pressure Wound Therapy: Wound #1 Medial Sacrum: Wound VAC settings at 125/130 mmHg continuous pressure. Use BLACK/GREEN foam to wound cavity. Use WHITE foam to fill any tunnel/s and/or undermining. Change VAC dressing 3 X WEEK. Change canister as indicated when full. Nurse may titrate settings and frequency of dressing changes as clinically indicated. - HHRN to start wound vac HHRN to change on Mondays, Wednesdays and Fridays Please use plain collagen under NPWT Home Health Nurse may d/c VAC for s/s of increased infection, significant wound regression, or uncontrolled drainage. Notify Wound Healing Center at [phone]. Number of foam/gauze pieces used in the dressing  = Laboratory ordered were: Wound culture routine The following medication(s) was prescribed: Bactrim DS oral 800 mg-160 mg tablet 1 1 tablet oral taken 2 times a day for 14 days starting 02/08/2018 I'm gonna suggest at this point that we continue with the above wound care measures. If anything changes or worsens in the meantime the patient will contact the office for additional recommendations. Otherwise will see were things stand in two weeks time. Please see above for specific wound care orders. We will see patient for re-evaluation in 2 week(s) here in the clinic. If anything worsens or changes patient will contact our office for additional recommendations. Electronic Signature(s) Signed: 02/09/2018 1:21:52 AM By: Lenda Kelp PA-C Previous Signature: 02/04/2018 6:09:48 PM Version By: Lenda Kelp PA-C Entered By: Lenda Kelp on 02/08/2018 15:28:54 Jessica [name] ([id number]) -------------------------------------------------------------------------------- ROS/PFSH Details [name]. Date of Service: 02/04/2018 2:45 PM Medical Record Number: [id number] Patient Account Number: [id number] Date of Birth/Sex: 30-Oct-1945 (72 y.o. F) Treating RN: Curtis Sites Primary Care Provider: Annita Brod Other Clinician: Referring Provider: Annita Brod Treating Provider/Extender: STONE III, HOYT Weeks in Treatment: 36 Information Obtained From Patient Wound History Constitutional Symptoms (General Health) Complaints and Symptoms: Negative for: Fever; Chills; Marked Weight Change Respiratory Complaints and Symptoms: No Complaints or Symptoms Cardiovascular Complaints and Symptoms: No Complaints or Symptoms Psychiatric Complaints and Symptoms: No Complaints or Symptoms Immunizations Pneumococcal Vaccine: Received Pneumococcal Vaccination: No Implantable Devices Family and Social History Cancer: No; Diabetes: No; Heart Disease: No; Never smoker;  Marital Status - Married Physician Affirmation I have reviewed and agree with the above information. Electronic Signature(s) Signed: 02/04/2018 5:35:44 PM By: Curtis Sites Signed: 02/04/2018 6:09:48 PM By: Lenda Kelp PA-C Entered By: Larina Bras  III, Leonard Schwartz on 02/04/2018 16:16:51 Jessica [name], Jessica [name] ([id number]) -------------------------------------------------------------------------------- SuperBill Details [name], Jessica [name]. Date of Service: 02/04/2018 Medical Record Number: [id number] Patient Account Number: [id number] Date of Birth/Sex: 1946/03/07 (72 y.o. F) Treating RN: Curtis Sites Primary Care Provider: Annita Brod Other Clinician: Referring Provider: Annita Brod Treating Provider/Extender: STONE III, HOYT Weeks in Treatment: 36 Diagnosis Coding ICD-10 Codes Code Description L89.154 Pressure ulcer of sacral region, stage 4 M48.00 Spinal stenosis, site unspecified R54 Age-related physical debility G89.4 Chronic pain syndrome E78.2 Mixed hyperlipidemia L89.93 Pressure ulcer of unspecified site, stage 3 Facility Procedures CPT4 Code: 86578469 Description: 99213 - WOUND CARE VISIT-LEV 3 EST PT Modifier: Quantity: 1 Physician Procedures CPT4 Code: 6295284 Description: 99214 - WC PHYS LEVEL 4 - EST PT ICD-10 Diagnosis Description L89.154 Pressure ulcer of sacral region, stage 4 M48.00 Spinal stenosis, site unspecified R54 Age-related physical debility G89.4 Chronic pain syndrome Modifier: Quantity: 1 Electronic Signature(s) Signed: 02/04/2018 4:19:01 PM By: Curtis Sites Signed: 02/04/2018 6:09:48 PM By: Lenda Kelp PA-C Entered By: Curtis Sites on 02/04/2018 16:19:01

## 2018-02-07 LAB — AEROBIC CULTURE  (SUPERFICIAL SPECIMEN)

## 2018-02-07 LAB — AEROBIC CULTURE W GRAM STAIN (SUPERFICIAL SPECIMEN): Special Requests: NORMAL

## 2018-02-08 NOTE — Progress Notes (Signed)
CASARA, PERRIER ([id number]) Visit Report for 02/04/2018 Arrival Information Details [name], Jessica [name]. Date of Service: 02/04/2018 2:45 PM Medical Record Number: [id number] Patient Account Number: [id number] Date of Birth/Sex: 1946/03/07 (72 y.o. F) Treating RN: Curtis Sites Primary Care Jaidyn Kuhl: Annita Brod Other Clinician: Referring Richar Dunklee: Annita Brod Treating Ruvim Risko/Extender: Linwood Dibbles, HOYT Weeks in Treatment: 36 Visit Information History Since Last Visit Added or deleted any medications: No Patient Arrived: Stretcher Any new allergies or adverse reactions: No Arrival Time: 14:51 Had a fall or experienced change in No Accompanied By: EMS and son activities of daily living that may affect Transfer Assistance: Manual risk of falls: Patient Identification Verified: Yes Signs or symptoms of abuse/neglect since last visito No Secondary Verification Process Yes Hospitalized since last visit: No Completed: Implantable device outside of the clinic excluding No Patient Has Alerts: Yes cellular tissue based products placed in the center Patient Alerts: ALLERGIC TO since last visit: ZINC Has Dressing in Place as Prescribed: Yes Pain Present Now: No Electronic Signature(s) Signed: 02/04/2018 4:00:36 PM By: Dayton Martes RCP, RRT, CHT Entered By: Dayton Martes on 02/04/2018 14:52:19 Jessica [name] ([id number]) -------------------------------------------------------------------------------- Clinic Level of Care Assessment Details [name]. Date of Service: 02/04/2018 2:45 PM Medical Record Number: [id number] Patient Account Number: [id number] Date of Birth/Sex: 05-Jul-1945 (72 y.o. F) Treating RN: Curtis Sites Primary Care Everado Pillsbury: Annita Brod Other Clinician: Referring Uniqua Kihn: Annita Brod Treating Mychal Decarlo/Extender: STONE III, HOYT Weeks in Treatment: 36 Clinic Level of Care  Assessment Items TOOL 4 Quantity Score []  - Use when only an EandM is performed on FOLLOW-UP visit 0 ASSESSMENTS - Nursing Assessment / Reassessment X - Reassessment of Co-morbidities (includes updates in patient status) 1 10 X- 1 5 Reassessment of Adherence to Treatment Plan ASSESSMENTS - Wound and Skin Assessment / Reassessment X - Simple Wound Assessment / Reassessment - one wound 1 5 []  - 0 Complex Wound Assessment / Reassessment - multiple wounds []  - 0 Dermatologic / Skin Assessment (not related to wound area) ASSESSMENTS - Focused Assessment []  - Circumferential Edema Measurements - multi extremities 0 []  - 0 Nutritional Assessment / Counseling / Intervention []  - 0 Lower Extremity Assessment (monofilament, tuning fork, pulses) []  - 0 Peripheral Arterial Disease Assessment (using hand held doppler) ASSESSMENTS - Ostomy and/or Continence Assessment and Care []  - Incontinence Assessment and Management 0 []  - 0 Ostomy Care Assessment and Management (repouching, etc.) PROCESS - Coordination of Care X - Simple Patient / Family Education for ongoing care 1 15 []  - 0 Complex (extensive) Patient / Family Education for ongoing care []  - 0 Staff obtains Chiropractor, Records, Test Results / Process Orders []  - 0 Staff telephones HHA, Nursing Homes / Clarify orders / etc []  - 0 Routine Transfer to another Facility (non-emergent condition) []  - 0 Routine Hospital Admission (non-emergent condition) []  - 0 New Admissions / Manufacturing engineer / Ordering NPWT, Apligraf, etc. []  - 0 Emergency Hospital Admission (emergent condition) X- 1 10 Simple Discharge Coordination Jessica [name], JUSTEN. ([id number]) []  - 0 Complex (extensive) Discharge Coordination PROCESS - Special Needs []  - Pediatric / Minor Patient Management 0 []  - 0 Isolation Patient Management []  - 0 Hearing / Language / Visual special needs []  - 0 Assessment of Community assistance (transportation, D/C planning,  etc.) []  - 0 Additional assistance / Altered mentation []  - 0 Support Surface(s) Assessment (bed, cushion, seat, etc.) INTERVENTIONS - Wound Cleansing / Measurement X - Simple Wound Cleansing - one wound  1 5 []  - 0 Complex Wound Cleansing - multiple wounds X- 1 5 Wound Imaging (photographs - any number of wounds) []  - 0 Wound Tracing (instead of photographs) X- 1 5 Simple Wound Measurement - one wound []  - 0 Complex Wound Measurement - multiple wounds INTERVENTIONS - Wound Dressings X - Small Wound Dressing one or multiple wounds 1 10 []  - 0 Medium Wound Dressing one or multiple wounds []  - 0 Large Wound Dressing one or multiple wounds X- 1 5 Application of Medications - topical []  - 0 Application of Medications - injection INTERVENTIONS - Miscellaneous []  - External ear exam 0 X- 1 5 Specimen Collection (cultures, biopsies, blood, body fluids, etc.) []  - 0 Specimen(s) / Culture(s) sent or taken to Lab for analysis []  - 0 Patient Transfer (multiple staff / Nurse, adult / Similar devices) []  - 0 Simple Staple / Suture removal (25 or less) []  - 0 Complex Staple / Suture removal (26 or more) []  - 0 Hypo / Hyperglycemic Management (close monitor of Blood Glucose) []  - 0 Ankle / Brachial Index (ABI) - do not check if billed separately X- 1 5 Vital Signs Jessica [name], [name] ([id number]) Has the patient been seen at the hospital within the last three years: Yes Total Score: 85 Level Of Care: New/Established - Level 3 Electronic Signature(s) Signed: 02/04/2018 5:35:44 PM By: Curtis Sites Entered By: Curtis Sites on 02/04/2018 16:18:54 Jessica [name] ([id number]) -------------------------------------------------------------------------------- Encounter Discharge Information Details [name]. Date of Service: 02/04/2018 2:45 PM Medical Record Number: [id number] Patient Account Number: [id number] Date of Birth/Sex: [birth year] (72 y.o.  F) Treating RN: Curtis Sites Primary Care Gorje Iyer: Annita Brod Other Clinician: Referring Vanassa Penniman: Annita Brod Treating Rim Thatch/Extender: Linwood Dibbles, HOYT Weeks in Treatment: 36 Encounter Discharge Information Items Discharge Condition: Stable Ambulatory Status: Stretcher Discharge Destination: Home Transportation: Private Auto Accompanied By: son and EMS Schedule Follow-up Appointment: Yes Clinical Summary of Care: Electronic Signature(s) Signed: 02/04/2018 5:35:44 PM By: Curtis Sites Entered By: Curtis Sites on 02/04/2018 16:21:02 Jessica [name] ([id number]) -------------------------------------------------------------------------------- Lower Extremity Assessment Details [name]. Date of Service: 02/04/2018 2:45 PM Medical Record Number: [id number] Patient Account Number: [id number] Date of Birth/Sex: 09-19-1945 (72 y.o. F) Treating RN: Huel Coventry Primary Care Raziah Funnell: Annita Brod Other Clinician: Referring Ashelyn Mccravy: Annita Brod Treating Kaydan Wilhoite/Extender: Lenda Kelp Weeks in Treatment: 36 Electronic Signature(s) Signed: 02/07/2018 3:24:13 PM By: Elliot Gurney, BSN, RN, CWS, Kim RN, BSN Entered By: Elliot Gurney, BSN, RN, CWS, Kim on 02/04/2018 15:19:11 Jessica [name] ([id number]) -------------------------------------------------------------------------------- Multi Wound Chart Details [name]. Date of Service: 02/04/2018 2:45 PM Medical Record Number: [id number] Patient Account Number: [id number] Date of Birth/Sex: 1945/08/27 (72 y.o. F) Treating RN: Curtis Sites Primary Care Shann Lewellyn: Annita Brod Other Clinician: Referring Kailin Principato: Annita Brod Treating Meila Berke/Extender: STONE III, HOYT Weeks in Treatment: 36 Vital Signs Height(in): 70 Pulse(bpm): 77 Weight(lbs): Blood Pressure(mmHg): 132/59 Body Mass Index(BMI): Temperature(F): 98.1 Respiratory Rate 16 (breaths/min): Photos: [1:No  Photos] [N/A:N/A] Wound Location: [1:Sacrum - Medial] [N/A:N/A] Wounding Event: [1:Pressure Injury] [N/A:N/A] Primary Etiology: [1:Pressure Ulcer] [N/A:N/A] Date Acquired: [1:01/15/2017] [N/A:N/A] Weeks of Treatment: [1:36] [N/A:N/A] Wound Status: [1:Open] [N/A:N/A] Measurements L x W x D [1:7.5x4x2.3] [N/A:N/A] (cm) Area (cm) : [1:23.562] [N/A:N/A] Volume (cm) : [1:54.192] [N/A:N/A] % Reduction in Area: [1:36.10%] [N/A:N/A] % Reduction in Volume: [1:-47.10%] [N/A:N/A] Starting Position 1 [1:9] (o'clock): Ending Position 1 [1:12] (o'clock): Maximum Distance 1 (cm): [1:3] Undermining: [1:Yes] [N/A:N/A] Classification: [1:Category/Stage IV] [N/A:N/A] Exudate Amount: [1:Large] [N/A:N/A]  Exudate Type: [1:Serosanguineous] [N/A:N/A] Exudate Color: [1:red, brown] [N/A:N/A] Foul Odor After Cleansing: [1:Yes] [N/A:N/A] Odor Anticipated Due to [1:No] [N/A:N/A] Product Use: Wound Margin: [1:Distinct, outline attached] [N/A:N/A] Granulation Amount: [1:Medium (34-66%)] [N/A:N/A] Granulation Quality: [1:Red, Pink, Hyper-granulation] [N/A:N/A] Necrotic Amount: [1:Medium (34-66%)] [N/A:N/A] Necrotic Tissue: [1:Eschar, Adherent Slough] [N/A:N/A] Exposed Structures: [1:Fat Layer (Subcutaneous Tissue) Exposed: Yes Muscle: Yes Bone: Yes Fascia: No] [N/A:N/A] Tendon: No Joint: No Epithelialization: None N/A N/A Periwound Skin Texture: Induration: Yes N/A N/A Scarring: Yes Excoriation: No Callus: No Crepitus: No Rash: No Periwound Skin Moisture: Maceration: No N/A N/A Dry/Scaly: No Periwound Skin Color: Erythema: Yes N/A N/A Rubor: Yes Atrophie Blanche: No Cyanosis: No Ecchymosis: No Hemosiderin Staining: No Mottled: No Pallor: No Erythema Location: Circumferential N/A N/A Temperature: No Abnormality N/A N/A Tenderness on Palpation: Yes N/A N/A Wound Preparation: Ulcer Cleansing: N/A N/A Rinsed/Irrigated with Saline Topical Anesthetic Applied: Other: lidocaine 4% Treatment  Notes Electronic Signature(s) Signed: 02/04/2018 5:35:44 PM By: Curtis Sites Entered By: Curtis Sites on 02/04/2018 15:56:04 Jessica [name] ([id number]) -------------------------------------------------------------------------------- Multi-Disciplinary Care Plan Details [name]. Date of Service: 02/04/2018 2:45 PM Medical Record Number: [id number] Patient Account Number: [id number] Date of Birth/Sex: [birth year] (72 y.o. F) Treating RN: Curtis Sites Primary Care Jatasia Gundrum: Annita Brod Other Clinician: Referring Igor Bishop: Annita Brod Treating Kurk Corniel/Extender: STONE III, HOYT Weeks in Treatment: 36 Active Inactive ` Orientation to the Wound Care Program Nursing Diagnoses: Knowledge deficit related to the wound healing center program Goals: Patient/caregiver will verbalize understanding of the Wound Healing Center Program Date Initiated: 06/03/2017 Target Resolution Date: 06/17/2017 Goal Status: Active Interventions: Provide education on orientation to the wound center Notes: ` Pressure Nursing Diagnoses: Knowledge deficit related to causes and risk factors for pressure ulcer development Knowledge deficit related to management of pressures ulcers Potential for impaired tissue integrity related to pressure, friction, moisture, and shear Goals: Patient will remain free from development of additional pressure ulcers Date Initiated: 06/03/2017 Target Resolution Date: 06/17/2017 Goal Status: Active Patient will remain free of pressure ulcers Date Initiated: 06/03/2017 Target Resolution Date: 06/17/2017 Goal Status: Active Patient/caregiver will verbalize risk factors for pressure ulcer development Date Initiated: 06/03/2017 Target Resolution Date: 06/17/2017 Goal Status: Active Interventions: Assess: immobility, friction, shearing, incontinence upon admission and as needed Assess potential for pressure ulcer upon admission and as  needed Notes: ` Wound/Skin Impairment Jessica [name], Jessica [name] ([id number]) Nursing Diagnoses: Impaired tissue integrity Goals: Patient/caregiver will verbalize understanding of skin care regimen Date Initiated: 06/03/2017 Target Resolution Date: 06/17/2017 Goal Status: Active Ulcer/skin breakdown will have a volume reduction of 30% by week 4 Date Initiated: 06/03/2017 Target Resolution Date: 06/17/2017 Goal Status: Active Interventions: Assess ulceration(s) every visit Treatment Activities: Patient referred to home care : 06/03/2017 Skin care regimen initiated : 06/03/2017 Notes: Electronic Signature(s) Signed: 02/04/2018 5:35:44 PM By: Curtis Sites Entered By: Curtis Sites on 02/04/2018 15:55:54 Jessica [name] ([id number]) -------------------------------------------------------------------------------- Pain Assessment Details [name]. Date of Service: 02/04/2018 2:45 PM Medical Record Number: [id number] Patient Account Number: [id number] Date of Birth/Sex: 05/31/1945 (72 y.o. F) Treating RN: Curtis Sites Primary Care Yamila Cragin: Annita Brod Other Clinician: Referring Garlene Apperson: Annita Brod Treating Tiara Bartoli/Extender: STONE III, HOYT Weeks in Treatment: 36 Active Problems Location of Pain Severity and Description of Pain Patient Has Paino No Site Locations Pain Management and Medication Current Pain Management: Electronic Signature(s) Signed: 02/04/2018 4:00:36 PM By: Sallee Provencal, RRT, CHT Signed: 02/04/2018 5:35:44 PM By: Curtis Sites Entered By: Dayton Martes on 02/04/2018 14:52:28 Jessica [name], Jessica [name] ([id number]) -------------------------------------------------------------------------------- Patient/Caregiver Education Details [name]. Date of Service: 02/04/2018 2:45 PM Medical Record Number: [id number] Patient Account Number: [id number] Date of Birth/Gender: 19-Sep-1945 (72  y.o. F) Treating RN: Curtis Sites Primary Care Physician: Annita Brod Other Clinician: Referring Physician: Annita Brod Treating Physician/Extender: Linwood Dibbles, HOYT Weeks in Treatment: 62 Education Assessment Education Provided To: Patient and Caregiver Education Topics Provided Wound/Skin Impairment: Handouts: Other: reportable s/s Methods: Explain/Verbal Responses: State content correctly Electronic Signature(s) Signed: 02/04/2018 5:35:44 PM By: Curtis Sites Entered By: Curtis Sites on 02/04/2018 16:21:19 Jessica [name] ([id number]) -------------------------------------------------------------------------------- Wound Assessment Details [name]. Date of Service: 02/04/2018 2:45 PM Medical Record Number: [id number] Patient Account Number: [id number] Date of Birth/Sex: 04/01/1946 (72 y.o. F) Treating RN: Huel Coventry Primary Care Eh Sauseda: Annita Brod Other Clinician: Referring Forestine Macho: Annita Brod Treating Jaida Basurto/Extender: STONE III, HOYT Weeks in Treatment: 36 Wound Status Wound Number: 1 Primary Etiology: Pressure Ulcer Wound Location: Sacrum - Medial Wound Status: Open Wounding Event: Pressure Injury Date Acquired: 01/15/2017 Weeks Of Treatment: 36 Clustered Wound: No Wound Measurements Length: (cm) 7.5 Width: (cm) 4 Depth: (cm) 2.3 Area: (cm) 23.562 Volume: (cm) 54.192 % Reduction in Area: 36.1% % Reduction in Volume: -47.1% Epithelialization: None Undermining: Yes Starting Position (o'clock): 9 Ending Position (o'clock): 12 Maximum Distance: (cm) 3 Wound Description Classification: Category/Stage IV Wound Margin: Distinct, outline attached Exudate Amount: Large Exudate Type: Serosanguineous Exudate Color: red, brown Foul Odor After Cleansing: Yes Due to Product Use: No Slough/Fibrino Yes Wound Bed Granulation Amount: Medium (34-66%) Exposed Structure Granulation Quality: Red, Pink,  Hyper-granulation Fascia Exposed: No Necrotic Amount: Medium (34-66%) Fat Layer (Subcutaneous Tissue) Exposed: Yes Necrotic Quality: Eschar, Adherent Slough Tendon Exposed: No Muscle Exposed: Yes Necrosis of Muscle: No Joint Exposed: No Bone Exposed: Yes Periwound Skin Texture Texture Color No Abnormalities Noted: No No Abnormalities Noted: No Callus: No Atrophie Blanche: No Crepitus: No Cyanosis: No Excoriation: No Ecchymosis: No Induration: Yes Erythema: Yes Rash: No Erythema Location: Circumferential Scarring: Yes Hemosiderin Staining: No Mottled: No Moisture Jessica [name], Jessica [name]. ([id number]) No Abnormalities Noted: No Pallor: No Dry / Scaly: No Rubor: Yes Maceration: No Temperature / Pain Temperature: No Abnormality Tenderness on Palpation: Yes Wound Preparation Ulcer Cleansing: Rinsed/Irrigated with Saline Topical Anesthetic Applied: Other: lidocaine 4%, Treatment Notes Wound #1 (Medial Sacrum) 1. Cleansed with: Clean wound with Normal Saline 2. Anesthetic Topical Lidocaine 4% cream to wound bed prior to debridement 4. Dressing Applied: Other dressing (specify in notes) Notes HHRN to replace wound vac once patient returns home, Dakin's soaked gauze with ABD pad on top secured with tape Electronic Signature(s) Signed: 02/07/2018 3:24:13 PM By: Elliot Gurney, BSN, RN, CWS, Kim RN, BSN Entered By: Elliot Gurney, BSN, RN, CWS, Kim on 02/04/2018 15:19:03 Jessica [name] ([id number]) -------------------------------------------------------------------------------- Vitals Details [name]. Date of Service: 02/04/2018 2:45 PM Medical Record Number: [id number] Patient Account Number: [id number] Date of Birth/Sex: 1946/01/24 (72 y.o. F) Treating RN: Curtis Sites Primary Care Aliviyah Malanga: Annita Brod Other Clinician: Referring Anala Whisenant: Annita Brod Treating Tekesha Almgren/Extender: STONE III, HOYT Weeks in Treatment: 36 Vital Signs Time Taken:  14:47 Temperature (F): 98.1 Height (in): 70 Pulse (bpm): 77 Respiratory Rate (breaths/min): 16 Blood Pressure (mmHg): 132/59 Reference Range: 80 - 120 mg / dl Electronic Signature(s) Signed: 02/04/2018 4:00:36 PM By: Dayton Martes RCP, RRT, CHT Entered By: Dayton Martes on 02/04/2018 14:55:40

## 2018-02-18 ENCOUNTER — Encounter: Payer: Medicare Other | Attending: Physician Assistant | Admitting: Physician Assistant

## 2018-02-18 DIAGNOSIS — M4800 Spinal stenosis, site unspecified: Secondary | ICD-10-CM | POA: Insufficient documentation

## 2018-02-18 DIAGNOSIS — G894 Chronic pain syndrome: Secondary | ICD-10-CM | POA: Insufficient documentation

## 2018-02-18 DIAGNOSIS — R54 Age-related physical debility: Secondary | ICD-10-CM | POA: Diagnosis not present

## 2018-02-18 DIAGNOSIS — E782 Mixed hyperlipidemia: Secondary | ICD-10-CM | POA: Diagnosis not present

## 2018-02-18 DIAGNOSIS — L89154 Pressure ulcer of sacral region, stage 4: Secondary | ICD-10-CM | POA: Insufficient documentation

## 2018-02-18 DIAGNOSIS — L8993 Pressure ulcer of unspecified site, stage 3: Secondary | ICD-10-CM | POA: Diagnosis not present

## 2018-02-21 NOTE — Progress Notes (Signed)
DONNESHA, [name] ([id number]) Visit Report for 02/18/2018 Arrival Information Details [name], Jessica [name]. Date of Service: 02/18/2018 11:15 AM Medical Record Number: [id number] Patient Account Number: [id number] Date of Birth/Sex: Oct 23, 1945 (72 y.o. F) Treating RN: Curtis Sites Primary Care Mcdaniel Ohms: Annita Brod Other Clinician: Referring Chick Cousins: Annita Brod Treating Korde Jeppsen/Extender: Linwood Dibbles, HOYT Weeks in Treatment: 38 Visit Information History Since Last Visit Added or deleted any medications: No Patient Arrived: Stretcher Any new allergies or adverse reactions: No Arrival Time: 11:06 Had a fall or experienced change in No Accompanied By: son activities of daily living that may affect Transfer Assistance: Manual risk of falls: Patient Identification Verified: Yes Signs or symptoms of abuse/neglect since last visito No Secondary Verification Process Yes Hospitalized since last visit: No Completed: Implantable device outside of the clinic excluding No Patient Has Alerts: Yes cellular tissue based products placed in the center Patient Alerts: ALLERGIC TO since last visit: ZINC Has Dressing in Place as Prescribed: Yes Pain Present Now: No Electronic Signature(s) Signed: 02/18/2018 4:11:24 PM By: Dayton Martes RCP, RRT, CHT Entered By: Dayton Martes on 02/18/2018 11:07:33 Jessica [name] ([id number]) -------------------------------------------------------------------------------- Clinic Level of Care Assessment Details [name]. Date of Service: 02/18/2018 11:15 AM Medical Record Number: [id number] Patient Account Number: [id number] Date of Birth/Sex: [birth year] (72 y.o. F) Treating RN: Curtis Sites Primary Care Lowanda Cashaw: Annita Brod Other Clinician: Referring Alette Kataoka: Annita Brod Treating Nash Bolls/Extender: STONE III, HOYT Weeks in Treatment: 38 Clinic Level of Care Assessment  Items TOOL 4 Quantity Score []  - Use when only an EandM is performed on FOLLOW-UP visit 0 ASSESSMENTS - Nursing Assessment / Reassessment X - Reassessment of Co-morbidities (includes updates in patient status) 1 10 X- 1 5 Reassessment of Adherence to Treatment Plan ASSESSMENTS - Wound and Skin Assessment / Reassessment X - Simple Wound Assessment / Reassessment - one wound 1 5 []  - 0 Complex Wound Assessment / Reassessment - multiple wounds []  - 0 Dermatologic / Skin Assessment (not related to wound area) ASSESSMENTS - Focused Assessment []  - Circumferential Edema Measurements - multi extremities 0 []  - 0 Nutritional Assessment / Counseling / Intervention []  - 0 Lower Extremity Assessment (monofilament, tuning fork, pulses) []  - 0 Peripheral Arterial Disease Assessment (using hand held doppler) ASSESSMENTS - Ostomy and/or Continence Assessment and Care []  - Incontinence Assessment and Management 0 []  - 0 Ostomy Care Assessment and Management (repouching, etc.) PROCESS - Coordination of Care X - Simple Patient / Family Education for ongoing care 1 15 []  - 0 Complex (extensive) Patient / Family Education for ongoing care []  - 0 Staff obtains Chiropractor, Records, Test Results / Process Orders []  - 0 Staff telephones HHA, Nursing Homes / Clarify orders / etc []  - 0 Routine Transfer to another Facility (non-emergent condition) []  - 0 Routine Hospital Admission (non-emergent condition) []  - 0 New Admissions / Manufacturing engineer / Ordering NPWT, Apligraf, etc. []  - 0 Emergency Hospital Admission (emergent condition) X- 1 10 Simple Discharge Coordination RHESA, FORSBERG. ([id number]) []  - 0 Complex (extensive) Discharge Coordination PROCESS - Special Needs []  - Pediatric / Minor Patient Management 0 []  - 0 Isolation Patient Management []  - 0 Hearing / Language / Visual special needs []  - 0 Assessment of Community assistance (transportation, D/C planning, etc.) []  -  0 Additional assistance / Altered mentation []  - 0 Support Surface(s) Assessment (bed, cushion, seat, etc.) INTERVENTIONS - Wound Cleansing / Measurement X - Simple Wound Cleansing - one wound 1 5 []  -  0 Complex Wound Cleansing - multiple wounds X- 1 5 Wound Imaging (photographs - any number of wounds) []  - 0 Wound Tracing (instead of photographs) X- 1 5 Simple Wound Measurement - one wound []  - 0 Complex Wound Measurement - multiple wounds INTERVENTIONS - Wound Dressings X - Small Wound Dressing one or multiple wounds 1 10 []  - 0 Medium Wound Dressing one or multiple wounds []  - 0 Large Wound Dressing one or multiple wounds X- 1 5 Application of Medications - topical []  - 0 Application of Medications - injection INTERVENTIONS - Miscellaneous []  - External ear exam 0 []  - 0 Specimen Collection (cultures, biopsies, blood, body fluids, etc.) []  - 0 Specimen(s) / Culture(s) sent or taken to Lab for analysis []  - 0 Patient Transfer (multiple staff / Nurse, adult / Similar devices) []  - 0 Simple Staple / Suture removal (25 or less) []  - 0 Complex Staple / Suture removal (26 or more) []  - 0 Hypo / Hyperglycemic Management (close monitor of Blood Glucose) []  - 0 Ankle / Brachial Index (ABI) - do not check if billed separately X- 1 5 Vital Signs Mogle, Leisa W. ([id number]) Has the patient been seen at the hospital within the last three years: Yes Total Score: 80 Level Of Care: ____ Electronic Signature(s) Signed: 02/18/2018 5:34:16 PM By: Curtis Sites Entered By: Curtis Sites on 02/18/2018 12:42:04 Jessica [name] ([id number]) -------------------------------------------------------------------------------- Encounter Discharge Information Details [name]. Date of Service: 02/18/2018 11:15 AM Medical Record Number: [id number] Patient Account Number: [id number] Date of Birth/Sex: 10/06/1945 (72 y.o. F) Treating RN: Curtis Sites Primary  Care Charnice Zwilling: Annita Brod Other Clinician: Referring Kynadie Yaun: Annita Brod Treating Arlington Sigmund/Extender: Linwood Dibbles, HOYT Weeks in Treatment: 41 Encounter Discharge Information Items Discharge Condition: Stable Ambulatory Status: Stretcher Discharge Destination: Home Transportation: Ambulance Accompanied By: son Schedule Follow-up Appointment: Yes Clinical Summary of Care: Electronic Signature(s) Signed: 02/18/2018 12:42:57 PM By: Curtis Sites Entered By: Curtis Sites on 02/18/2018 12:42:57 Jessica [name] ([id number]) -------------------------------------------------------------------------------- Lower Extremity Assessment Details [name]. Date of Service: 02/18/2018 11:15 AM Medical Record Number: [id number] Patient Account Number: [id number] Date of Birth/Sex: 1945-06-16 (72 y.o. F) Treating RN: Huel Coventry Primary Care Colston Pyle: Annita Brod Other Clinician: Referring Branden Vine: Annita Brod Treating Parish Dubose/Extender: Lenda Kelp Weeks in Treatment: 38 Electronic Signature(s) Signed: 02/18/2018 5:51:21 PM By: Elliot Gurney, BSN, RN, CWS, Kim RN, BSN Entered By: Elliot Gurney, BSN, RN, CWS, Kim on 02/18/2018 11:35:03 Jessica [name] ([id number]) -------------------------------------------------------------------------------- Multi Wound Chart Details [name]. Date of Service: 02/18/2018 11:15 AM Medical Record Number: [id number] Patient Account Number: [id number] Date of Birth/Sex: Oct 16, 1945 (72 y.o. F) Treating RN: Curtis Sites Primary Care Stefon Ramthun: Annita Brod Other Clinician: Referring Samvel Zinn: Annita Brod Treating Tomicka Lover/Extender: STONE III, HOYT Weeks in Treatment: 38 Vital Signs Height(in): 70 Pulse(bpm): 73 Weight(lbs): Blood Pressure(mmHg): 124/52 Body Mass Index(BMI): Temperature(F): 98.0 Respiratory Rate 16 (breaths/min): Photos: [1:No Photos] [N/A:N/A] Wound Location: [1:Sacrum - Medial]  [N/A:N/A] Wounding Event: [1:Pressure Injury] [N/A:N/A] Primary Etiology: [1:Pressure Ulcer] [N/A:N/A] Date Acquired: [1:01/15/2017] [N/A:N/A] Weeks of Treatment: [1:38] [N/A:N/A] Wound Status: [1:Open] [N/A:N/A] Measurements L x W x D [1:6.6x3.5x2.8] [N/A:N/A] (cm) Area (cm) : [1:18.143] [N/A:N/A] Volume (cm) : [1:50.8] [N/A:N/A] % Reduction in Area: [1:50.80%] [N/A:N/A] % Reduction in Volume: [1:-37.90%] [N/A:N/A] Position 1 (o'clock): [1:11] Maximum Distance 1 (cm): [1:3.8] Starting Position 1 (o'clock): Ending Position 1 (o'clock): Tunneling: [1:Yes] [N/A:N/A] Undermining: [1:Yes] [N/A:N/A] Classification: [1:Category/Stage IV] [N/A:N/A] Exudate Amount: [1:Large] [N/A:N/A] Exudate Type: [1:Serosanguineous] [N/A:N/A] Exudate  Color: [1:red, brown] [N/A:N/A] Foul Odor After Cleansing: [1:Yes] [N/A:N/A] Odor Anticipated Due to [1:No] [N/A:N/A] Product Use: Wound Margin: [1:Distinct, outline attached] [N/A:N/A] Granulation Amount: [1:Medium (34-66%)] [N/A:N/A] Granulation Quality: [1:Red, Pink, Hyper-granulation] [N/A:N/A] Necrotic Amount: [1:Medium (34-66%)] [N/A:N/A] Necrotic Tissue: [1:Eschar, Adherent Slough] [N/A:N/A] Exposed Structures: [1:Fat Layer (Subcutaneous Tissue) Exposed: Yes Muscle: Yes Bone: Yes] [N/A:N/A] Fascia: No Tendon: No Joint: No Epithelialization: None N/A N/A Periwound Skin Texture: Induration: Yes N/A N/A Scarring: Yes Excoriation: No Callus: No Crepitus: No Rash: No Periwound Skin Moisture: Maceration: No N/A N/A Dry/Scaly: No Periwound Skin Color: Erythema: Yes N/A N/A Rubor: Yes Atrophie Blanche: No Cyanosis: No Ecchymosis: No Hemosiderin Staining: No Mottled: No Pallor: No Erythema Location: Circumferential N/A N/A Temperature: No Abnormality N/A N/A Tenderness on Palpation: Yes N/A N/A Wound Preparation: Ulcer Cleansing: N/A N/A Rinsed/Irrigated with Saline Topical Anesthetic Applied: Other: lidocaine 4% Treatment  Notes Electronic Signature(s) Signed: 02/18/2018 5:34:16 PM By: Curtis Sites Entered By: Curtis Sites on 02/18/2018 11:50:18 Jessica [name] ([id number]) -------------------------------------------------------------------------------- Multi-Disciplinary Care Plan Details [name]. Date of Service: 02/18/2018 11:15 AM Medical Record Number: [id number] Patient Account Number: [id number] Date of Birth/Sex: 16-May-1945 (72 y.o. F) Treating RN: Curtis Sites Primary Care Quantina Dershem: Annita Brod Other Clinician: Referring Mica Ramdass: Annita Brod Treating Adrine Hayworth/Extender: STONE III, HOYT Weeks in Treatment: 38 Active Inactive ` Orientation to the Wound Care Program Nursing Diagnoses: Knowledge deficit related to the wound healing center program Goals: Patient/caregiver will verbalize understanding of the Wound Healing Center Program Date Initiated: 06/03/2017 Target Resolution Date: 06/17/2017 Goal Status: Active Interventions: Provide education on orientation to the wound center Notes: ` Pressure Nursing Diagnoses: Knowledge deficit related to causes and risk factors for pressure ulcer development Knowledge deficit related to management of pressures ulcers Potential for impaired tissue integrity related to pressure, friction, moisture, and shear Goals: Patient will remain free from development of additional pressure ulcers Date Initiated: 06/03/2017 Target Resolution Date: 06/17/2017 Goal Status: Active Patient will remain free of pressure ulcers Date Initiated: 06/03/2017 Target Resolution Date: 06/17/2017 Goal Status: Active Patient/caregiver will verbalize risk factors for pressure ulcer development Date Initiated: 06/03/2017 Target Resolution Date: 06/17/2017 Goal Status: Active Interventions: Assess: immobility, friction, shearing, incontinence upon admission and as needed Assess potential for pressure ulcer upon admission and as  needed Notes: ` Wound/Skin Impairment PATIRICA, LONGSHORE ([id number]) Nursing Diagnoses: Impaired tissue integrity Goals: Patient/caregiver will verbalize understanding of skin care regimen Date Initiated: 06/03/2017 Target Resolution Date: 06/17/2017 Goal Status: Active Ulcer/skin breakdown will have a volume reduction of 30% by week 4 Date Initiated: 06/03/2017 Target Resolution Date: 06/17/2017 Goal Status: Active Interventions: Assess ulceration(s) every visit Treatment Activities: Patient referred to home care : 06/03/2017 Skin care regimen initiated : 06/03/2017 Notes: Electronic Signature(s) Signed: 02/18/2018 5:34:16 PM By: Curtis Sites Entered By: Curtis Sites on 02/18/2018 11:50:12 Jessica [name] ([id number]) -------------------------------------------------------------------------------- Pain Assessment Details [name]. Date of Service: 02/18/2018 11:15 AM Medical Record Number: [id number] Patient Account Number: [id number] Date of Birth/Sex: [birth year] (72 y.o. F) Treating RN: Curtis Sites Primary Care Ostin Mathey: Annita Brod Other Clinician: Referring Penelope Fittro: Annita Brod Treating Hooria Gasparini/Extender: STONE III, HOYT Weeks in Treatment: 38 Active Problems Location of Pain Severity and Description of Pain Patient Has Paino No Site Locations Pain Management and Medication Current Pain Management: Electronic Signature(s) Signed: 02/18/2018 4:11:24 PM By: Sallee Provencal, RRT, CHT Signed: 02/18/2018 5:34:16 PM By: Curtis Sites Entered By: Dayton Martes on 02/18/2018 11:07:41 Jessica [name] ([id number]) -------------------------------------------------------------------------------- Patient/Caregiver Education  Details Patient Name: ANALUISA, TUDOR. Date of Service: 02/18/2018 11:15 AM Medical Record Number: [id number] Patient Account Number: [id number] Date of Birth/Gender: 02/18/1946 (73 y.o.  F) Treating RN: Curtis Sites Primary Care Physician: Annita Brod Other Clinician: Referring Physician: Annita Brod Treating Physician/Extender: Linwood Dibbles, HOYT Weeks in Treatment: 5 Education Assessment Education Provided To: Patient and Caregiver Education Topics Provided Wound/Skin Impairment: Handouts: Other: reportable s/s Methods: Explain/Verbal Responses: State content correctly Electronic Signature(s) Signed: 02/18/2018 5:34:16 PM By: Curtis Sites Entered By: Curtis Sites on 02/18/2018 12:41:26 Jessica [name] ([id number]) -------------------------------------------------------------------------------- Wound Assessment Details [name]. Date of Service: 02/18/2018 11:15 AM Medical Record Number: [id number] Patient Account Number: [id number] Date of Birth/Sex: January 13, 1946 (72 y.o. F) Treating RN: Huel Coventry Primary Care Rolla Kedzierski: Annita Brod Other Clinician: Referring Samanatha Brammer: Annita Brod Treating Reford Olliff/Extender: STONE III, HOYT Weeks in Treatment: 38 Wound Status Wound Number: 1 Primary Etiology: Pressure Ulcer Wound Location: Sacrum - Medial Wound Status: Open Wounding Event: Pressure Injury Date Acquired: 01/15/2017 Weeks Of Treatment: 38 Clustered Wound: No Photos Photo Uploaded By: Elliot Gurney, BSN, RN, CWS, Kim on 02/18/2018 17:25:50 Wound Measurements Length: (cm) 6.6 Width: (cm) 3.5 Depth: (cm) 2.8 Area: (cm) 18.143 Volume: (cm) 50.8 % Reduction in Area: 50.8% % Reduction in Volume: -37.9% Epithelialization: None Tunneling: Yes Position (o'clock): 11 Maximum Distance: (cm) 3.8 Undermining: Yes Wound Description Classification: Category/Stage IV Foul Odor Wound Margin: Distinct, outline attached Due to Pr Exudate Amount: Large Slough/Fi Exudate Type: Serosanguineous Exudate Color: red, brown After Cleansing: Yes oduct Use: No brino Yes Wound Bed Granulation Amount: Medium (34-66%) Exposed  Structure Granulation Quality: Red, Pink, Hyper-granulation Fascia Exposed: No Necrotic Amount: Medium (34-66%) Fat Layer (Subcutaneous Tissue) Exposed: Yes Necrotic Quality: Eschar, Adherent Slough Tendon Exposed: No Muscle Exposed: Yes GELENA, KLOSINSKI. ([id number]) Necrosis of Muscle: No Joint Exposed: No Bone Exposed: Yes Periwound Skin Texture Texture Color No Abnormalities Noted: No No Abnormalities Noted: No Callus: No Atrophie Blanche: No Crepitus: No Cyanosis: No Excoriation: No Ecchymosis: No Induration: Yes Erythema: Yes Rash: No Erythema Location: Circumferential Scarring: Yes Hemosiderin Staining: No Mottled: No Moisture Pallor: No No Abnormalities Noted: No Rubor: Yes Dry / Scaly: No Maceration: No Temperature / Pain Temperature: No Abnormality Tenderness on Palpation: Yes Wound Preparation Ulcer Cleansing: Rinsed/Irrigated with Saline Topical Anesthetic Applied: Other: lidocaine 4%, Treatment Notes Wound #1 (Medial Sacrum) Notes HHRN to replace wound vac once patient returns home, Dakin's soaked gauze with ABD pad on top secured with tape Electronic Signature(s) Signed: 02/18/2018 5:51:21 PM By: Elliot Gurney, BSN, RN, CWS, Kim RN, BSN Entered By: Elliot Gurney, BSN, RN, CWS, Kim on 02/18/2018 11:34:51 Jessica [name] ([id number]) -------------------------------------------------------------------------------- Vitals Details [name]. Date of Service: 02/18/2018 11:15 AM Medical Record Number: [id number] Patient Account Number: [id number] Date of Birth/Sex: 1945-05-04 (72 y.o. F) Treating RN: Curtis Sites Primary Care Mailani Degroote: Annita Brod Other Clinician: Referring Jaiyanna Safran: Annita Brod Treating Smayan Hackbart/Extender: STONE III, HOYT Weeks in Treatment: 38 Vital Signs Time Taken: 11:05 Temperature (F): 98.0 Height (in): 70 Pulse (bpm): 73 Respiratory Rate (breaths/min): 16 Blood Pressure (mmHg): 124/52 Reference Range:  80 - 120 mg / dl Electronic Signature(s) Signed: 02/18/2018 4:11:24 PM By: Dayton Martes RCP, RRT, CHT Entered By: Dayton Martes on 02/18/2018 11:11:03

## 2018-02-21 NOTE — Progress Notes (Signed)
Jessica [name], Jessica [name] ([id number]) Visit Report for 02/18/2018 Chief Complaint Document Details [name]. Date of Service: 02/18/2018 11:15 AM Medical Record Number: [id number] Patient Account Number: [id number] Date of Birth/Sex: [birth year] (72 y.o. F) Treating RN: Curtis Sites Primary Care Provider: Annita Brod Other Clinician: Referring Provider: Annita Brod Treating Provider/Extender: STONE III, HOYT Weeks in Treatment: 79 Information Obtained from: Patient Chief Complaint she is here for evaluation of a sacral ulcer and bilateral lower extremity ulcers Electronic Signature(s) Signed: 02/18/2018 1:14:28 PM By: Lenda Kelp PA-C Entered By: Lenda Kelp on 02/18/2018 11:24:42 Jessica [name] ([id number]) -------------------------------------------------------------------------------- HPI Details [name]. Date of Service: 02/18/2018 11:15 AM Medical Record Number: [id number] Patient Account Number: [id number] Date of Birth/Sex: [birth year] (72 y.o. F) Treating RN: Curtis Sites Primary Care Provider: Annita Brod Other Clinician: Referring Provider: Annita Brod Treating Provider/Extender: Linwood Dibbles, HOYT Weeks in Treatment: 38 History of Present Illness HPI Description: 05/27/17-she is here in initial evaluation for a left-sided sacral stage IV pressure ulcer and bilateral lower extremity, lateral aspect, unstageable pressure ulcers. She is accompanied by her husband and her son, who are her primary caregivers. She is bedbound secondary to spinal stenosis. According to her son and husband she was hospitalized from 10/5-11/1 with healthcare associated pneumonia and altered mental status. During her hospitalization she was intubated, extubated on 10/27. She was discharged with Foley catheter and follow up with urology. According to her son and spouse she developed the sacral ulcer during hospitalization. Home health has  been applying Santyl daily. She does have a low air loss mattress and is repositioned every 2-3 hours per her family's report. According to the son and spouse she had an appointment with urology on 12/18 and during that appointment developed discoloration to her bilateral lower extremities which ultimately developed into unstageable pressure ulcers to the lateral aspects of her bilateral lower extremities. There has been no topical treatment applied to these. She continues to have home health. There is no concerns expressed regarding dietary intake, stating she eats 3 meals a day, eating was provided; she is supplemented with boost with protein. 06/03/17-she is here in follow-up evaluation for sacral and bilateral lower extremity ulcers. Plain film x-ray done today reveals no distraction to the sacrum or coccyx, no visible abnormalities. Home health has ordered the negative pressure wound system but it has not been initiated. We will continue with Hydrofera Blue until initiation of negative pressure wound system and continue with Santyl to bilateral lower extremity ulcers. Follow-up next week 06/10/17-she is here in follow-up evaluation for sacral and bilateral lower extremity ulcers. The wound VAC will be available tomorrow per home health. We will initiate wound VAC therapy to the sacral ulcer 3 times weekly (Thursday, Saturday/Sunday, Tuesday). We will continue with Santyl to the lower extremity ulcers. The patient's son is checking into home health therapy over the weekend for [facility] changes, with the understanding that if VAC changes cannot be performed over the weekend he will need to change his mother's appointments to Monday, Wednesday or Friday. The sacral ulcer clinically does not appear infected but there has been a change in the amount of drainage acutely, there is no significant amount of devitalized tissue, there is no malodor. Wound culture was obtained to evaluate for occult infection we  will hold off on antibiotic therapy until sensitivities are resulted. 06/18/17 on evaluation today patient appears to be doing fairly well in my opinion although this is the first time I  have seen this patient she has been previously evaluated by Tacey RuizLeah here in our office. She is going to be switching to Fridays to see me due to the Wound VAC schedule being changed on Monday, Wednesday, and Friday. Subsequently she seems to be doing fairly well with the Wound VAC. Her son who was present during evaluation today states that he somewhat stresses of the Wound VAC in making sure that it was functioning appropriately. With that being said everything seems to be working well he knows what settings on the [facility] HVAC itself to look at and ensure that it is functioning properly. Overall the wound appears to be nice and clean there is no need for debridement today. She has no discomfort in her bilateral lower extremity ulcers also appear to be improving based on measurements and what this honest tell me about the overall appearance. 07/02/17 on evaluation today I noted in patients wound bed that was actually an odor that had not previously been noted. Subsequently there was also a small area of bone that was not previously noted during my last evaluation. This did appear to be necrotic and was being somewhat forced out by the body around the region of granulation. She does not have any pain which is good news. Nonetheless the overall appearance of the ulcer is making me concerned for the patient having developed osteomyelitis. Currently she is not been on any antibiotics and the Wound VAC has been doing fairly well in general. With that being said I do not think we need to continue the Wound VAC if she potentially has a bone infection. At least not until it is properly addressed with antibiotics. 07/09/17 on evaluation today patient appears to actually be doing very well in regard to her sacral ulcer compared to last  week. There is actually no exposed bone at this point. 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 With that being said still I do believe she needs to have a referral to infectious disease due to the osteomyelitis I am gonna recommend that she continue with the doxycycline based on the culture results which showed Eikenella Corrodens as the Jessica BradyOVERMAN, [name] ([id number]) organism identified that tetracycline should work for. She does not seem to be having any discomfort whatsoever at this point. 07/16/17 on evaluation today patient actually appears to be doing rather well in regard to her sacral wound. I think that the original wound site actually appears much better than previously noted. With that being said she does have a new superficial injury on the right sacral region which appears to be due to according to the sign transport that occurred for her MRI unfortunately. Fortunately this is not too deep and I do think it can be managed but it was a new area that was not previously noted. Otherwise she has been tolerating the Dakinos soaked gauze packing without complication. 07/30/17 on evaluation today patient presents for reevaluation concerning her sacral wound. She has been tolerating the dressing changes without complication. With that being said her wound is doing so well at this point that I think she may be at the point where we could reinitiate the Wound VAC currently and hopefully see good results from this. I'm very pleased with how she has responded to the Dakinos soaked gauze packing. 08/13/17 evaluation today patient appears to be doing excellent in  regard to her lower extremity ulcer this seems to be cleaning up very nicely. In regard to the sacral ulcer the area of trauma on the right lateral portion of the wound actually appears  to be much better than previously noted during the last evaluation. The word has definitely filled in and the Wound VAC appears to be helpful I do believe. Overall I'm very pleased with how things seem to be progressing at this time. Patient likewise is also happy that things are doing well. 08/27/17 on evaluation today patient presents for follow-up concerning her ongoing issues with her sacral ulcer and lower extremity ulcer. Fortunately she has been tolerating the dressing changes well complication the Wound VAC in general seems to have done very well up to this point. She does not have any evidence of infection which is good news. She does have some dark discoloration in central portion of the wound which is troublesome for the possibility of pressure injury to the site it sounds like her prior air mattress was not functioning properly her son has just bought a new one for her this is doing much better. Otherwise things seem to be progressing nicely. 09/10/17 on evaluation today patient presents for follow-up concerning her sacral wound and lower extremity ulcer. She has been tolerating the switch of her dressing changes to the Dakinos soaked gauze dressing very well in regard to the sacrum. The left lateral lower extremity ulcer appears to have a new area open just distal to the one that we have been treating with Santyl and this is new since her last visit. There does not appear to be any evidence of infection which is good news. With that being said there's a lot of maceration here at the site and I feel this is likely due to the switch and the dressings it appears that due to the sticking of the dressings it will switch to utilizing a telfa pad over the Santyl. I think this caused a lot of drainage to be collected and situated in the region just under the dressing which has led to this causing which duration of breakdown. Overall there does not appear to be any significant pain which is good  news. Of note I did actually have a conversation with the radiologist who is an interventional radiologist with [facility]. 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 With that being said there does not appear to be any rush to get this done according to the interventional radiologist whom I spoke with. Therefore after discussion it was determined that there gonna wait about six months prior to considering the procedure to get time for hopefully the sacral wound to heal in the interim to at least some degree. 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 According to nursing staff these are all improved, the negative pressure wound therapy system was put on hold last week. We will resume negative pressure wound therapy today, continue with Santyl to the lower extremity and she will follow-up next week. 09/24/17 on evaluation today unfortunately the patient's wound appears to be doing significantly worse compared to last week's evaluation and even my valuation the week before. She actually has bone noted on the left wound margin in the region of undermining unfortunately. This was not noted during the last evaluation. I am concerned about the fact that this seems to be worse not better since  our last visit with her. My biggest concern is that she's likely developing a worsening osteomyelitis. This was discussed with patient and her son today during the office visit. 05/03/17 on evaluation today patient actually appears to be doing rather well in regard to her sacral ulcer compared to last week's evaluation. She has been tolerating the dressing changes without complication. Fortunately even though we're not utilizing the Wound VAC she seems to be making some strides in seeing the wound area overall improved quite dramatically in my pinion in just one weeks time. She also seems to  be staying off of this to the point that I do not see any evidence of new injury which is excellent news. Whatever she's been doing in that regard over the past week I would want her to continue. I did also review the results of her bone culture which revealed group B strep as the responsible organism. Again this is what's causing her osteomyelitis she does have infectious disease appointment scheduled for 10/11/17 10/08/1898 valuation today patient appears to be doing better for the most part in regard to the sacral wound. Overall she is showing signs of having granulation of the bone which is good news. There is an area towards the left of the wound where she does seem to be showing some signs of opening this it would be due to additional pressure to the area unfortunately. Jessica [name], Jessica [name]. ([id number]) There does not appear to be any evidence of infection spreading which is good news that do not see any evidence of significant purulent discharge which is also good news. 10/26/17 on evaluation today patient sacral ulcer actually appears to be very healthy and doing well in regard to the overall appearance of the wound. With that being said she does seem to be having some issues at this point in time with some shear/friction injury of the sacrum from where she was transported to [facility] for her infectious disease appointment. She has been placed on IV antibiotic therapy I will have to go back and find that note for review as I did not have access to that today. Nonetheless I will add the next visit be sure to document the exact antibiotics that she is taking at this point. Nonetheless I do believe the wound is doing better there's no bone exposure nothing that requires debridement in regard to the sacral wound. Likewise her left lateral lower extremity ulcer also appears to be doing significantly better. In general I feel like things are showing signs of improvement all around. 11/12/17 on  evaluation today patient appears to be doing rather well in regard to her sacral wound. In general I do see signs of granulation at this point. Fortunately there does not appear to be any evidence of infection which is good news as well. In general overall happy with the appearance. With that being said I'll be see I would like for this to be progressing more rapidly as with the patient and her son but nonetheless at least things do not appear to be doing worse. Her lower extremity ulcer is doing significantly better. 11/26/17 on evaluation today patient appears to be doing a little better in regard to the sacral wound especially in the peripheral of the wound bed. She actually did have an abrasion on the right gluteal region that's completely resolved to this point. Her lower extremity also appears to be for the most part completely resolved. Overall the sacrum itself seems to be the one thing that is  still open and given her trouble. No fevers, chills, nausea, or vomiting noted at this time. She actually has an appointment next week with infectious disease for recheck to see how things are doing. She maybe switch to oral medications at that point. 12/09/17 on evaluation today patient actually appears to be doing much better in regard to her sacral wound. I do feel like she has made good progress at this time as far as healing is concerned. In fact the wound looks better to me today than it has in quite some time. She is on oral antibiotic therapy and no longer on the IV antibiotics. She did see infectious disease last week. 12/24/17 on evaluation today patient's wounds actually appear to be doing decently well in regard to the sacral wound in particular. When it comes to her lower extremity ulcers both appear to be completely healed at this point in the one area where she had lived the rash has completely resolved at this time. Overall very pleased with the progress that has been made. Nonetheless  the patient seems to be doing well in general. She still does have quite a bit of healing to do in regard to the sacral wound but I feel like she is making progress. 01/07/18 on evaluation today patient actually appears to be doing excellent today regard to her sacral wound. The granular quality in the base of the wound is excellent and shown signs of good improvement there's no evidence of contusion nor deep tissue injury and the periwound actually appears to be doing well. In general I'm very pleased with the overall appearance. 02/04/18 on evaluation today patient's wound actually appears to be doing decently well as far as the granulation is concerned. She has been tolerating the dressing changes without complication. Right now we are still using the Wound VAC. Nonetheless overall there apparently is some cater to the wound and this is concerning simply for the fact that she could be developing a soft tissue infection again. She's had this happen in the past and at that time responded very well to the antibiotics. Nonetheless I don't think I would likely put her on anything prophylactically but rather wait for the results of the culture to return. 02/18/18 on evaluation today patient actually appears to be doing fairly well. She has been tolerating the dressing changes. And the Wound VAC seems to be doing well. Overall I'm very pleased with how things have improved over the past couple weeks since I last saw her. Fortunately there does not appear to be any evidence of overt infection at this time which is good news. She has been taking the antibiotics without complication. Specifically that is the Bactrim DS which was prescribed on 02/08/18. Electronic Signature(s) Signed: 02/18/2018 1:14:28 PM By: Lenda Kelp PA-C Entered By: Lenda Kelp on 02/18/2018 12:57:53 Jessica [name] ([id number]) -------------------------------------------------------------------------------- Physical Exam  Details [name]. Date of Service: 02/18/2018 11:15 AM Medical Record Number: [id number] Patient Account Number: [id number] Date of Birth/Sex: 16-Jun-1945 (72 y.o. F) Treating RN: Curtis Sites Primary Care Provider: Annita Brod Other Clinician: Referring Provider: Annita Brod Treating Provider/Extender: STONE III, HOYT Weeks in Treatment: 62 Constitutional Well-nourished and well-hydrated in no acute distress. Respiratory normal breathing without difficulty. clear to auscultation bilaterally. Cardiovascular regular rate and rhythm with normal S1, S2. Psychiatric this patient is able to make decisions and demonstrates good insight into disease process. Alert and Oriented x 3. pleasant and cooperative. Notes Patient's wound bed currently did  not require any sharp debridement it actually appears to have a good regular bed which is excellent news. There does not appear to be any evidence of infection which is also excellent news. Overall I'm very pleased with how things appear. I do not see any signs of soft tissue infection at this time and think the Bactrim has done very well for her. Electronic Signature(s) Signed: 02/18/2018 1:14:28 PM By: Lenda Kelp PA-C Entered By: Lenda Kelp on 02/18/2018 12:58:21 Jessica [name] ([id number]) -------------------------------------------------------------------------------- Physician Orders Details [name]. Date of Service: 02/18/2018 11:15 AM Medical Record Number: [id number] Patient Account Number: [id number] Date of Birth/Sex: Jan 14, 1946 (72 y.o. F) Treating RN: Curtis Sites Primary Care Provider: Annita Brod Other Clinician: Referring Provider: Annita Brod Treating Provider/Extender: Linwood Dibbles, HOYT Weeks in Treatment: 28 Verbal / Phone Orders: No Diagnosis Coding ICD-10 Coding Code Description L89.154 Pressure ulcer of sacral region, stage 4 M48.00 Spinal stenosis,  site unspecified R54 Age-related physical debility G89.4 Chronic pain syndrome E78.2 Mixed hyperlipidemia L89.93 Pressure ulcer of unspecified site, stage 3 Wound Cleansing Wound #1 Medial Sacrum o Other: - please cleanse sacral wound with dakins moistened gauze - do not spray dakins on wound Anesthetic (add to Medication List) Wound #1 Medial Sacrum o Topical Lidocaine 4% cream applied to wound bed prior to debridement (In Clinic Only). Skin Barriers/Peri-Wound Care Wound #1 Medial Sacrum o Skin Prep Primary Wound Dressing Wound #1 Medial Sacrum o Pack wound with: - Dakin's soaked gauze in clinic Secondary Dressing Wound #1 Medial Sacrum o ABD pad - secure with tape use until wound vac is placed back on wound Dressing Change Frequency Wound #1 Medial Sacrum o Change Dressing Monday, Wednesday, Friday Follow-up Appointments Wound #1 Medial Sacrum o Return Appointment in 1 month Off-Loading Wound #1 Medial Sacrum Jessica [name], Jessica [name]. ([id number]) o Turn and reposition every 2 hours Additional Orders / Instructions Wound #1 Medial Sacrum o Increase protein intake. Home Health Wound #1 Medial Sacrum o Continue Home Health Visits - Advanced HHRN to start wound vac o Home Health Nurse may visit PRN to address patientos wound care needs. o FACE TO FACE ENCOUNTER: MEDICARE and MEDICAID PATIENTS: I certify that this patient is under my care and that I had a face-to-face encounter that meets the physician face-to-face encounter requirements with this patient on this date. The encounter with the patient was in whole or in part for the following MEDICAL CONDITION: (primary reason for Home Healthcare) MEDICAL NECESSITY: I certify, that based on my findings, NURSING services are a medically necessary home health service. HOME BOUND STATUS: I certify that my clinical findings support that this patient is homebound (i.e., Due to illness or injury, pt requires aid  of supportive devices such as crutches, cane, wheelchairs, walkers, the use of special transportation or the assistance of another person to leave their place of residence. There is a normal inability to leave the home and doing so requires considerable and taxing effort. Other absences are for medical reasons / religious services and are infrequent or of short duration when for other reasons). o If current dressing causes regression in wound condition, may D/C ordered dressing product/s and apply Normal Saline Moist Dressing daily until next Wound Healing Center / Other MD appointment. Notify Wound Healing Center of regression in wound condition at [phone]. o Please direct any NON-WOUND related issues/requests for orders to patient's Primary Care Physician Negative Pressure Wound Therapy Wound #1 Medial Sacrum o Wound VAC settings at 125/130  mmHg continuous pressure. Use BLACK/GREEN foam to wound cavity. Use WHITE foam to fill any tunnel/s and/or undermining. Change VAC dressing 3 X WEEK. Change canister as indicated when full. Nurse may titrate settings and frequency of dressing changes as clinically indicated. - HHRN to start wound vac HHRN to change on Mondays, Wednesdays and Fridays Please use plain collagen under NPWT o Home Health Nurse may d/c VAC for s/s of increased infection, significant wound regression, or uncontrolled drainage. Notify Wound Healing Center at [phone]. o Number of foam/gauze pieces used in the dressing = Electronic Signature(s) Signed: 02/18/2018 1:14:28 PM By: Lenda Kelp PA-C Signed: 02/18/2018 5:34:16 PM By: Curtis Sites Entered By: Curtis Sites on 02/18/2018 11:54:36 Jessica [name] ([id number]) -------------------------------------------------------------------------------- Problem List Details [name]. Date of Service: 02/18/2018 11:15 AM Medical Record Number: [id number] Patient Account Number:  [id number] Date of Birth/Sex: 1945/09/09 (72 y.o. F) Treating RN: Curtis Sites Primary Care Provider: Annita Brod Other Clinician: Referring Provider: Annita Brod Treating Provider/Extender: STONE III, HOYT Weeks in Treatment: 38 Active Problems ICD-10 Evaluated Encounter Code Description Active Date Today Diagnosis L89.154 Pressure ulcer of sacral region, stage 4 05/27/2017 No Yes M48.00 Spinal stenosis, site unspecified 05/27/2017 No Yes R54 Age-related physical debility 05/27/2017 No Yes G89.4 Chronic pain syndrome 05/27/2017 No Yes E78.2 Mixed hyperlipidemia 05/27/2017 No Yes L89.93 Pressure ulcer of unspecified site, stage 3 05/27/2017 No Yes Inactive Problems Resolved Problems Electronic Signature(s) Signed: 02/18/2018 1:14:28 PM By: Lenda Kelp PA-C Entered By: Lenda Kelp on 02/18/2018 11:24:37 Jessica [name] ([id number]) -------------------------------------------------------------------------------- Progress Note Details [name]. Date of Service: 02/18/2018 11:15 AM Medical Record Number: [id number] Patient Account Number: [id number] Date of Birth/Sex: 08-May-1945 (72 y.o. F) Treating RN: Curtis Sites Primary Care Provider: Annita Brod Other Clinician: Referring Provider: Annita Brod Treating Provider/Extender: Linwood Dibbles, HOYT Weeks in Treatment: 38 Subjective Chief Complaint Information obtained from Patient she is here for evaluation of a sacral ulcer and bilateral lower extremity ulcers History of Present Illness (HPI) 05/27/17-she is here in initial evaluation for a left-sided sacral stage IV pressure ulcer and bilateral lower extremity, lateral aspect, unstageable pressure ulcers. She is accompanied by her husband and her son, who are her primary caregivers. She is bedbound secondary to spinal stenosis. According to her son and husband she was hospitalized from 10/5-11/1 with healthcare associated pneumonia and altered  mental status. During her hospitalization she was intubated, extubated on 10/27. She was discharged with Foley catheter and follow up with urology. According to her son and spouse she developed the sacral ulcer during hospitalization. Home health has been applying Santyl daily. She does have a low air loss mattress and is repositioned every 2-3 hours per her family's report. According to the son and spouse she had an appointment with urology on 12/18 and during that appointment developed discoloration to her bilateral lower extremities which ultimately developed into unstageable pressure ulcers to the lateral aspects of her bilateral lower extremities. There has been no topical treatment applied to these. She continues to have home health. There is no concerns expressed regarding dietary intake, stating she eats 3 meals a day, eating was provided; she is supplemented with boost with protein. 06/03/17-she is here in follow-up evaluation for sacral and bilateral lower extremity ulcers. Plain film x-ray done today reveals no distraction to the sacrum or coccyx, no visible abnormalities. Home health has ordered the negative pressure wound system but it has not been initiated. We will continue with Hydrofera  Blue until initiation of negative pressure wound system and continue with Santyl to bilateral lower extremity ulcers. Follow-up next week 06/10/17-she is here in follow-up evaluation for sacral and bilateral lower extremity ulcers. The wound VAC will be available tomorrow per home health. We will initiate wound VAC therapy to the sacral ulcer 3 times weekly (Thursday, Saturday/Sunday, Tuesday). We will continue with Santyl to the lower extremity ulcers. The patient's son is checking into home health therapy over the weekend for [facility] changes, with the understanding that if VAC changes cannot be performed over the weekend he will need to change his mother's appointments to Monday, Wednesday or Friday. The  sacral ulcer clinically does not appear infected but there has been a change in the amount of drainage acutely, there is no significant amount of devitalized tissue, there is no malodor. Wound culture was obtained to evaluate for occult infection we will hold off on antibiotic therapy until sensitivities are resulted. 06/18/17 on evaluation today patient appears to be doing fairly well in my opinion although this is the first time I have seen this patient she has been previously evaluated by Tacey Ruiz here in our office. She is going to be switching to Fridays to see me due to the Wound VAC schedule being changed on Monday, Wednesday, and Friday. Subsequently she seems to be doing fairly well with the Wound VAC. Her son who was present during evaluation today states that he somewhat stresses of the Wound VAC in making sure that it was functioning appropriately. With that being said everything seems to be working well he knows what settings on the [facility] itself to look at and ensure that it is functioning properly. Overall the wound appears to be nice and clean there is no need for debridement today. She has no discomfort in her bilateral lower extremity ulcers also appear to be improving based on measurements and what this honest tell me about the overall appearance. 07/02/17 on evaluation today I noted in patients wound bed that was actually an odor that had not previously been noted. Subsequently there was also a small area of bone that was not previously noted during my last evaluation. This did appear to be necrotic and was being somewhat forced out by the body around the region of granulation. She does not have any pain which is good news. Nonetheless the overall appearance of the ulcer is making me concerned for the patient having developed osteomyelitis. Currently she is not been on any antibiotics and the Wound VAC has been doing fairly well in general. With that being said I do not think we need to  continue the Wound VAC if she potentially has a bone infection. At least not until it is properly addressed with antibiotics. Jessica [name], Jessica [name] ([id number]) 07/09/17 on evaluation today patient appears to actually be doing very well in regard to her sacral ulcer compared to last week. There is actually no exposed bone at this point. 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 With that being said still I do believe she needs to have a referral to infectious disease due to the osteomyelitis I am gonna recommend that she continue with the doxycycline based on the culture results which showed Eikenella Corrodens as the organism identified that tetracycline should work for. She does not seem to be having any discomfort whatsoever at this point.  07/16/17 on evaluation today patient actually appears to be doing rather well in regard to her sacral wound. I think that the original wound site actually appears much better than previously noted. With that being said she does have a new superficial injury on the right sacral region which appears to be due to according to the sign transport that occurred for her MRI unfortunately. Fortunately this is not too deep and I do think it can be managed but it was a new area that was not previously noted. Otherwise she has been tolerating the Dakin s soaked gauze packing without complication. 07/30/17 on evaluation today patient presents for reevaluation concerning her sacral wound. She has been tolerating the dressing changes without complication. With that being said her wound is doing so well at this point that I think she may be at the point where we could reinitiate the Wound VAC currently and hopefully see good results from this. I'm very pleased with how she has responded to the [facility] s soaked gauze  packing. 08/13/17 evaluation today patient appears to be doing excellent in regard to her lower extremity ulcer this seems to be cleaning up very nicely. In regard to the sacral ulcer the area of trauma on the right lateral portion of the wound actually appears to be much better than previously noted during the last evaluation. The word has definitely filled in and the Wound VAC appears to be helpful I do believe. Overall I'm very pleased with how things seem to be progressing at this time. Patient likewise is also happy that things are doing well. 08/27/17 on evaluation today patient presents for follow-up concerning her ongoing issues with her sacral ulcer and lower extremity ulcer. Fortunately she has been tolerating the dressing changes well complication the Wound VAC in general seems to have done very well up to this point. She does not have any evidence of infection which is good news. She does have some dark discoloration in central portion of the wound which is troublesome for the possibility of pressure injury to the site it sounds like her prior air mattress was not functioning properly her son has just bought a new one for her this is doing much better. Otherwise things seem to be progressing nicely. 09/10/17 on evaluation today patient presents for follow-up concerning her sacral wound and lower extremity ulcer. She has been tolerating the switch of her dressing changes to the Dakin s soaked gauze dressing very well in regard to the sacrum. The left lateral lower extremity ulcer appears to have a new area open just distal to the one that we have been treating with Santyl and this is new since her last visit. There does not appear to be any evidence of infection which is good news. With that being said there's a lot of maceration here at the site and I feel this is likely due to the switch and the dressings it appears that due to the sticking of the dressings it will switch to utilizing a  telfa pad over the Santyl. I think this caused a lot of drainage to be collected and situated in the region just under the dressing which has led to this causing which duration of breakdown. Overall there does not appear to be any significant pain which is good news. Of note I did actually have a conversation with the radiologist who is an interventional radiologist with [facility]. 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 With that being said there does not appear to be any rush to get this done according to the interventional radiologist whom I spoke with. Therefore after discussion it was determined that there gonna wait about six months prior to considering the procedure to get time for hopefully the sacral wound to heal in the interim to at least some degree. 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 According to nursing staff these are all improved, the negative pressure wound therapy system was put on hold last week. We will resume negative pressure wound therapy today, continue with Santyl to the lower extremity and she will follow-up next week. 09/24/17 on evaluation today unfortunately the patient's wound appears to be doing significantly worse compared to last week's evaluation and even my valuation the week before. She actually has bone noted on the left wound margin in the region of undermining unfortunately. This was not noted during the last evaluation. I am concerned about the fact that this seems to be worse not better since our last visit with her. My biggest concern is that she's likely developing a worsening osteomyelitis. This was discussed with patient and her son today during the office visit. 05/03/17 on evaluation today patient actually appears to be doing rather well in regard to her sacral ulcer compared to last week's evaluation. She has been  tolerating the dressing changes without complication. Fortunately even though we're not utilizing the Wound VAC she seems to be making some strides in seeing the wound area overall improved quite dramatically in my pinion in just one weeks time. She also seems to be staying off of this to the point that I do not see any evidence of new injury which is excellent news. Whatever she's been doing in that regard over the past week I would want her to continue. Jessica [name], Jessica [name] ([id number]) did also review the results of her bone culture which revealed group B strep as the responsible organism. Again this is what's causing her osteomyelitis she does have infectious disease appointment scheduled for 10/11/17 10/08/1898 valuation today patient appears to be doing better for the most part in regard to the sacral wound. Overall she is showing signs of having granulation of the bone which is good news. There is an area towards the left of the wound where she does seem to be showing some signs of opening this it would be due to additional pressure to the area unfortunately. There does not appear to be any evidence of infection spreading which is good news that do not see any evidence of significant purulent discharge which is also good news. 10/26/17 on evaluation today patient sacral ulcer actually appears to be very healthy and doing well in regard to the overall appearance of the wound. With that being said she does seem to be having some issues at this point in time with some shear/friction injury of the sacrum from where she was transported to [facility] for her infectious disease appointment. She has been placed on IV antibiotic therapy I will have to go back and find that note for review as I did not have access to that today. Nonetheless I will add the next visit be sure to document the exact antibiotics that she is taking at this point. Nonetheless I do believe the wound is doing better there's no bone  exposure nothing that requires debridement in regard to the sacral wound. Likewise her left lateral  lower extremity ulcer also appears to be doing significantly better. In general I feel like things are showing signs of improvement all around. 11/12/17 on evaluation today patient appears to be doing rather well in regard to her sacral wound. In general I do see signs of granulation at this point. Fortunately there does not appear to be any evidence of infection which is good news as well. In general overall happy with the appearance. With that being said I'll be see I would like for this to be progressing more rapidly as with the patient and her son but nonetheless at least things do not appear to be doing worse. Her lower extremity ulcer is doing significantly better. 11/26/17 on evaluation today patient appears to be doing a little better in regard to the sacral wound especially in the peripheral of the wound bed. She actually did have an abrasion on the right gluteal region that's completely resolved to this point. Her lower extremity also appears to be for the most part completely resolved. Overall the sacrum itself seems to be the one thing that is still open and given her trouble. No fevers, chills, nausea, or vomiting noted at this time. She actually has an appointment next week with infectious disease for recheck to see how things are doing. She maybe switch to oral medications at that point. 12/09/17 on evaluation today patient actually appears to be doing much better in regard to her sacral wound. I do feel like she has made good progress at this time as far as healing is concerned. In fact the wound looks better to me today than it has in quite some time. She is on oral antibiotic therapy and no longer on the IV antibiotics. She did see infectious disease last week. 12/24/17 on evaluation today patient's wounds actually appear to be doing decently well in regard to the sacral wound  in particular. When it comes to her lower extremity ulcers both appear to be completely healed at this point in the one area where she had lived the rash has completely resolved at this time. Overall very pleased with the progress that has been made. Nonetheless the patient seems to be doing well in general. She still does have quite a bit of healing to do in regard to the sacral wound but I feel like she is making progress. 01/07/18 on evaluation today patient actually appears to be doing excellent today regard to her sacral wound. The granular quality in the base of the wound is excellent and shown signs of good improvement there's no evidence of contusion nor deep tissue injury and the periwound actually appears to be doing well. In general I'm very pleased with the overall appearance. 02/04/18 on evaluation today patient's wound actually appears to be doing decently well as far as the granulation is concerned. She has been tolerating the dressing changes without complication. Right now we are still using the Wound VAC. Nonetheless overall there apparently is some cater to the wound and this is concerning simply for the fact that she could be developing a soft tissue infection again. She's had this happen in the past and at that time responded very well to the antibiotics. Nonetheless I don't think I would likely put her on anything prophylactically but rather wait for the results of the culture to return. 02/18/18 on evaluation today patient actually appears to be doing fairly well. She has been tolerating the dressing changes. And the Wound VAC seems to be doing well. Overall I'm very  pleased with how things have improved over the past couple weeks since I last saw her. Fortunately there does not appear to be any evidence of overt infection at this time which is good news. She has been taking the antibiotics without complication. Specifically that is the Bactrim DS which was prescribed  on 02/08/18. Jessica [name], Jessica [name] ([id number]) Patient History Information obtained from Patient. Family History No family history of Cancer, Diabetes, Heart Disease. Social History Never smoker, Marital Status - Married. Review of Systems (ROS) Constitutional Symptoms (General Health) Denies complaints or symptoms of Fever, Chills. Respiratory The patient has no complaints or symptoms. Cardiovascular The patient has no complaints or symptoms. Psychiatric The patient has no complaints or symptoms. Objective Constitutional Well-nourished and well-hydrated in no acute distress. Vitals Time Taken: 11:05 AM, Height: 70 in, Temperature: 98.0 F, Pulse: 73 bpm, Respiratory Rate: 16 breaths/min, Blood Pressure: 124/52 mmHg. Respiratory normal breathing without difficulty. clear to auscultation bilaterally. Cardiovascular regular rate and rhythm with normal S1, S2. Psychiatric this patient is able to make decisions and demonstrates good insight into disease process. Alert and Oriented x 3. pleasant and cooperative. General Notes: Patient's wound bed currently did not require any sharp debridement it actually appears to have a good regular bed which is excellent news. There does not appear to be any evidence of infection which is also excellent news. Overall I'm very pleased with how things appear. I do not see any signs of soft tissue infection at this time and think the Bactrim has done very well for her. Integumentary (Hair, Skin) Wound #1 status is Open. Original cause of wound was Pressure Injury. The wound is located on the Medial Sacrum. The wound measures 6.6cm length x 3.5cm width x 2.8cm depth; 18.143cm^2 area and 50.8cm^3 volume. There is bone, muscle, and Fat Layer (Subcutaneous Tissue) Exposed exposed. Tunneling has been noted at 11:00 with a maximum distance of 3.8cm. Undermining begins at :00 and ends at :00. There is a large amount of serosanguineous drainage noted. Foul  odor after cleansing was noted. The wound margin is distinct with the outline attached to the wound base. There is medium (34- 66%) red, pink, hyper - granulation within the wound bed. There is a medium (34-66%) amount of necrotic tissue within the Jessica [name], Jessica [name]. ([id number]) wound bed including Eschar and Adherent Slough. The periwound skin appearance exhibited: Induration, Scarring, Rubor, Erythema. The periwound skin appearance did not exhibit: Callus, Crepitus, Excoriation, Rash, Dry/Scaly, Maceration, Atrophie Blanche, Cyanosis, Ecchymosis, Hemosiderin Staining, Mottled, Pallor. The surrounding wound skin color is noted with erythema which is circumferential. Periwound temperature was noted as No Abnormality. The periwound has tenderness on palpation. 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 Skin Barriers/Peri-Wound Care: Wound #1 Medial Sacrum: Skin Prep Primary Wound Dressing: Wound #1 Medial Sacrum: Pack wound with: - Dakin's soaked gauze in clinic Secondary Dressing: Wound #1 Medial Sacrum: ABD pad - secure with tape use until wound vac is placed back on wound Dressing Change Frequency: Wound #1 Medial Sacrum: Change Dressing Monday, Wednesday, Friday Follow-up Appointments: Wound #1 Medial Sacrum: Return Appointment in 1  month Off-Loading: Wound #1 Medial Sacrum: Turn and reposition every 2 hours Additional Orders / Instructions: Wound #1 Medial Sacrum: Increase protein intake. Home  Health: Wound #1 Medial Sacrum: Continue Home Health Visits - Advanced HHRN to start wound vac Jessica [name], Jessica [name] ([id number]) Home Health Nurse may visit PRN to address patient s wound care needs. FACE TO FACE ENCOUNTER: MEDICARE and MEDICAID PATIENTS: I certify that this patient is under my care and that I had a face-to-face encounter that meets the physician face-to-face encounter requirements with this patient on this date. The encounter with the patient was in whole or in part for the following MEDICAL CONDITION: (primary reason for Home Healthcare) MEDICAL NECESSITY: I certify, that based on my findings, NURSING services are a medically necessary home health service. HOME BOUND STATUS: I certify that my clinical findings support that this patient is homebound (i.e., Due to illness or injury, pt requires aid of supportive devices such as crutches, cane, wheelchairs, walkers, the use of special transportation or the assistance of another person to leave their place of residence. There is a normal inability to leave the home and doing so requires considerable and taxing effort. Other absences are for medical reasons / religious services and are infrequent or of short duration when for other reasons). If current dressing causes regression in wound condition, may D/C ordered dressing product/s and apply Normal Saline Moist Dressing daily until next Wound Healing Center / Other MD appointment. Notify Wound Healing Center of regression in wound condition at [phone]. Please direct any NON-WOUND related issues/requests for orders to patient's Primary Care Physician Negative Pressure Wound Therapy: Wound #1 Medial Sacrum: Wound VAC settings at 125/130 mmHg continuous pressure. Use BLACK/GREEN foam to wound cavity. Use WHITE foam to fill any tunnel/s and/or undermining. Change VAC dressing 3 X WEEK. Change canister as indicated when full. Nurse may titrate settings and frequency of dressing  changes as clinically indicated. - HHRN to start wound vac HHRN to change on Mondays, Wednesdays and Fridays Please use plain collagen under NPWT Home Health Nurse may d/c VAC for s/s of increased infection, significant wound regression, or uncontrolled drainage. Notify Wound Healing Center at [phone]. Number of foam/gauze pieces used in the dressing = My suggestion at this point is going to be that we go ahead and continue with the above wound care measures for the next week. She's in agreement the plan. If anything changes or worsens in the meantime patient or her son will contact the office and let me know. Otherwise we gonna see her back for reevaluation. Please see above for specific wound care orders. We will see patient for re-evaluation in 4 week(s) here in the clinic. If anything worsens or changes patient will contact our office for additional recommendations. Electronic Signature(s) Signed: 02/18/2018 1:14:28 PM By: Lenda Kelp PA-C Entered By: Lenda Kelp on 02/18/2018 12:58:46 Jessica [name] ([id number]) -------------------------------------------------------------------------------- ROS/PFSH Details [name]. Date of Service: 02/18/2018 11:15 AM Medical Record Number: [id number] Patient Account Number: [id number] Date of Birth/Sex: 23-Dec-1945 (72 y.o. F) Treating RN: Curtis Sites Primary Care Provider: Annita Brod Other Clinician: Referring Provider: Annita Brod Treating Provider/Extender: STONE III, HOYT Weeks in Treatment: 38 Information Obtained From Patient Wound History Constitutional Symptoms (General Health) Complaints and Symptoms: Negative for: Fever; Chills Respiratory Complaints and Symptoms: No Complaints or Symptoms Cardiovascular Complaints and Symptoms: No Complaints or Symptoms Psychiatric Complaints and Symptoms: No Complaints or Symptoms Immunizations Pneumococcal Vaccine: Received Pneumococcal  Vaccination: No Implantable  Devices Family and Social History Cancer: No; Diabetes: No; Heart Disease: No; Never smoker; Marital Status - Married Physician Affirmation I have reviewed and agree with the above information. Electronic Signature(s) Signed: 02/18/2018 1:14:28 PM By: Lenda Kelp PA-C Signed: 02/18/2018 5:34:16 PM By: Curtis Sites Entered By: Lenda Kelp on 02/18/2018 12:58:06 Jessica [name] ([id number]) -------------------------------------------------------------------------------- SuperBill Details [name]. Date of Service: 02/18/2018 Medical Record Number: [id number] Patient Account Number: [id number] Date of Birth/Sex: 1945-07-01 (72 y.o. F) Treating RN: Curtis Sites Primary Care Provider: Annita Brod Other Clinician: Referring Provider: Annita Brod Treating Provider/Extender: STONE III, HOYT Weeks in Treatment: 38 Diagnosis Coding ICD-10 Codes Code Description L89.154 Pressure ulcer of sacral region, stage 4 M48.00 Spinal stenosis, site unspecified R54 Age-related physical debility G89.4 Chronic pain syndrome E78.2 Mixed hyperlipidemia L89.93 Pressure ulcer of unspecified site, stage 3 Facility Procedures CPT4 Code: [id number] Description: 99213 - WOUND CARE VISIT-LEV 3 EST PT Modifier: Quantity: 1 Physician Procedures CPT4 Code: [id number] Description: 99214 - WC PHYS LEVEL 4 - EST PT ICD-10 Diagnosis Description L89.154 Pressure ulcer of sacral region, stage 4 M48.00 Spinal stenosis, site unspecified R54 Age-related physical debility G89.4 Chronic pain syndrome Modifier: Quantity: 1 Electronic Signature(s) Signed: 02/18/2018 1:14:28 PM By: Lenda Kelp PA-C Previous Signature: 02/18/2018 12:42:12 PM Version By: Curtis Sites Entered By: Lenda Kelp on 02/18/2018 12:59:04

## 2018-02-22 ENCOUNTER — Ambulatory Visit: Payer: Self-pay | Admitting: Urology

## 2018-03-15 ENCOUNTER — Ambulatory Visit: Payer: Self-pay | Admitting: Urology

## 2018-03-18 ENCOUNTER — Encounter: Payer: Medicare Other | Attending: Physician Assistant | Admitting: Physician Assistant

## 2018-03-18 DIAGNOSIS — M4800 Spinal stenosis, site unspecified: Secondary | ICD-10-CM | POA: Insufficient documentation

## 2018-03-18 DIAGNOSIS — L89154 Pressure ulcer of sacral region, stage 4: Secondary | ICD-10-CM | POA: Diagnosis present

## 2018-03-18 DIAGNOSIS — E782 Mixed hyperlipidemia: Secondary | ICD-10-CM | POA: Diagnosis not present

## 2018-03-18 DIAGNOSIS — L8993 Pressure ulcer of unspecified site, stage 3: Secondary | ICD-10-CM | POA: Diagnosis not present

## 2018-03-18 DIAGNOSIS — Z7401 Bed confinement status: Secondary | ICD-10-CM | POA: Diagnosis not present

## 2018-03-18 DIAGNOSIS — G894 Chronic pain syndrome: Secondary | ICD-10-CM | POA: Diagnosis not present

## 2018-03-18 DIAGNOSIS — I10 Essential (primary) hypertension: Secondary | ICD-10-CM | POA: Diagnosis not present

## 2018-03-22 NOTE — Progress Notes (Signed)
Jessica [name], Jessica [name] ([id number]) Visit Report for 03/18/2018 Chief Complaint Document Details [name], Jessica [name]. Date of Service: 03/18/2018 10:00 AM Medical Record Number: [id number] Patient Account Number: [id number] Date of Birth/Sex: 1946/01/24 (72 y.o. F) Treating RN: Curtis Sites Primary Care Provider: Annita Brod Other Clinician: Referring Provider: Annita Brod Treating Provider/Extender: Linwood Dibbles, HOYT Weeks in Treatment: 42 Information Obtained from: Patient Chief Complaint she is here for evaluation of a sacral ulcer and bilateral lower extremity ulcers Electronic Signature(s) Signed: 03/21/2018 12:50:09 AM By: Lenda Kelp PA-C Entered By: Lenda Kelp on 03/18/2018 10:22:31 Jessica [name] ([id number]) -------------------------------------------------------------------------------- HPI Details [name]. Date of Service: 03/18/2018 10:00 AM Medical Record Number: [id number] Patient Account Number: [id number] Date of Birth/Sex: 1945-09-03 (72 y.o. F) Treating RN: Curtis Sites Primary Care Provider: Annita Brod Other Clinician: Referring Provider: Annita Brod Treating Provider/Extender: Linwood Dibbles, HOYT Weeks in Treatment: 42 History of Present Illness HPI Description: 05/27/17-she is here in initial evaluation for a left-sided sacral stage IV pressure ulcer and bilateral lower extremity, lateral aspect, unstageable pressure ulcers. She is accompanied by her husband and her son, who are her primary caregivers. She is bedbound secondary to spinal stenosis. According to her son and husband she was hospitalized from 10/5-11/1 with healthcare associated pneumonia and altered mental status. During her hospitalization she was intubated, extubated on 10/27. She was discharged with Foley catheter and follow up with urology. According to her son and spouse she developed the sacral ulcer during hospitalization. Home health has  been applying Santyl daily. She does have a low air loss mattress and is repositioned every 2-3 hours per her family's report. According to the son and spouse she had an appointment with urology on 12/18 and during that appointment developed discoloration to her bilateral lower extremities which ultimately developed into unstageable pressure ulcers to the lateral aspects of her bilateral lower extremities. There has been no topical treatment applied to these. She continues to have home health. There is no concerns expressed regarding dietary intake, stating she eats 3 meals a day, eating was provided; she is supplemented with boost with protein. 06/03/17-she is here in follow-up evaluation for sacral and bilateral lower extremity ulcers. Plain film x-ray done today reveals no distraction to the sacrum or coccyx, no visible abnormalities. Home health has ordered the negative pressure wound system but it has not been initiated. We will continue with Hydrofera Blue until initiation of negative pressure wound system and continue with Santyl to bilateral lower extremity ulcers. Follow-up next week 06/10/17-she is here in follow-up evaluation for sacral and bilateral lower extremity ulcers. The wound VAC will be available tomorrow per home health. We will initiate wound VAC therapy to the sacral ulcer 3 times weekly (Thursday, Saturday/Sunday, Tuesday). We will continue with Santyl to the lower extremity ulcers. The patient's son is checking into home health therapy over the weekend for [facility] changes, with the understanding that if VAC changes cannot be performed over the weekend he will need to change his mother's appointments to Monday, Wednesday or Friday. The sacral ulcer clinically does not appear infected but there has been a change in the amount of drainage acutely, there is no significant amount of devitalized tissue, there is no malodor. Wound culture was obtained to evaluate for occult infection we  will hold off on antibiotic therapy until sensitivities are resulted. 06/18/17 on evaluation today patient appears to be doing fairly well in my opinion although this is the first time I  have seen this patient she has been previously evaluated by Tacey RuizLeah here in our office. She is going to be switching to Fridays to see me due to the Wound VAC schedule being changed on Monday, Wednesday, and Friday. Subsequently she seems to be doing fairly well with the Wound VAC. Her son who was present during evaluation today states that he somewhat stresses of the Wound VAC in making sure that it was functioning appropriately. With that being said everything seems to be working well he knows what settings on the [facility] HVAC itself to look at and ensure that it is functioning properly. Overall the wound appears to be nice and clean there is no need for debridement today. She has no discomfort in her bilateral lower extremity ulcers also appear to be improving based on measurements and what this honest tell me about the overall appearance. 07/02/17 on evaluation today I noted in patients wound bed that was actually an odor that had not previously been noted. Subsequently there was also a small area of bone that was not previously noted during my last evaluation. This did appear to be necrotic and was being somewhat forced out by the body around the region of granulation. She does not have any pain which is good news. Nonetheless the overall appearance of the ulcer is making me concerned for the patient having developed osteomyelitis. Currently she is not been on any antibiotics and the Wound VAC has been doing fairly well in general. With that being said I do not think we need to continue the Wound VAC if she potentially has a bone infection. At least not until it is properly addressed with antibiotics. 07/09/17 on evaluation today patient appears to actually be doing very well in regard to her sacral ulcer compared to last  week. There is actually no exposed bone at this point. 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 With that being said still I do believe she needs to have a referral to infectious disease due to the osteomyelitis I am gonna recommend that she continue with the doxycycline based on the culture results which showed Eikenella Corrodens as the Jessica BradyOVERMAN, [name] ([id number]) organism identified that tetracycline should work for. She does not seem to be having any discomfort whatsoever at this point. 07/16/17 on evaluation today patient actually appears to be doing rather well in regard to her sacral wound. I think that the original wound site actually appears much better than previously noted. With that being said she does have a new superficial injury on the right sacral region which appears to be due to according to the sign transport that occurred for her MRI unfortunately. Fortunately this is not too deep and I do think it can be managed but it was a new area that was not previously noted. Otherwise she has been tolerating the Dakinos soaked gauze packing without complication. 07/30/17 on evaluation today patient presents for reevaluation concerning her sacral wound. She has been tolerating the dressing changes without complication. With that being said her wound is doing so well at this point that I think she may be at the point where we could reinitiate the Wound VAC currently and hopefully see good results from this. I'm very pleased with how she has responded to the Dakinos soaked gauze packing. 08/13/17 evaluation today patient appears to be doing excellent in  regard to her lower extremity ulcer this seems to be cleaning up very nicely. In regard to the sacral ulcer the area of trauma on the right lateral portion of the wound actually appears  to be much better than previously noted during the last evaluation. The word has definitely filled in and the Wound VAC appears to be helpful I do believe. Overall I'm very pleased with how things seem to be progressing at this time. Patient likewise is also happy that things are doing well. 08/27/17 on evaluation today patient presents for follow-up concerning her ongoing issues with her sacral ulcer and lower extremity ulcer. Fortunately she has been tolerating the dressing changes well complication the Wound VAC in general seems to have done very well up to this point. She does not have any evidence of infection which is good news. She does have some dark discoloration in central portion of the wound which is troublesome for the possibility of pressure injury to the site it sounds like her prior air mattress was not functioning properly her son has just bought a new one for her this is doing much better. Otherwise things seem to be progressing nicely. 09/10/17 on evaluation today patient presents for follow-up concerning her sacral wound and lower extremity ulcer. She has been tolerating the switch of her dressing changes to the Dakinos soaked gauze dressing very well in regard to the sacrum. The left lateral lower extremity ulcer appears to have a new area open just distal to the one that we have been treating with Santyl and this is new since her last visit. There does not appear to be any evidence of infection which is good news. With that being said there's a lot of maceration here at the site and I feel this is likely due to the switch and the dressings it appears that due to the sticking of the dressings it will switch to utilizing a telfa pad over the Santyl. I think this caused a lot of drainage to be collected and situated in the region just under the dressing which has led to this causing which duration of breakdown. Overall there does not appear to be any significant pain which is good  news. Of note I did actually have a conversation with the radiologist who is an interventional radiologist with [facility]. 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 With that being said there does not appear to be any rush to get this done according to the interventional radiologist whom I spoke with. Therefore after discussion it was determined that there gonna wait about six months prior to considering the procedure to get time for hopefully the sacral wound to heal in the interim to at least some degree. 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 According to nursing staff these are all improved, the negative pressure wound therapy system was put on hold last week. We will resume negative pressure wound therapy today, continue with Santyl to the lower extremity and she will follow-up next week. 09/24/17 on evaluation today unfortunately the patient's wound appears to be doing significantly worse compared to last week's evaluation and even my valuation the week before. She actually has bone noted on the left wound margin in the region of undermining unfortunately. This was not noted during the last evaluation. I am concerned about the fact that this seems to be worse not better since  our last visit with her. My biggest concern is that she's likely developing a worsening osteomyelitis. This was discussed with patient and her son today during the office visit. 05/03/17 on evaluation today patient actually appears to be doing rather well in regard to her sacral ulcer compared to last week's evaluation. She has been tolerating the dressing changes without complication. Fortunately even though we're not utilizing the Wound VAC she seems to be making some strides in seeing the wound area overall improved quite dramatically in my pinion in just one weeks time. She also seems to  be staying off of this to the point that I do not see any evidence of new injury which is excellent news. Whatever she's been doing in that regard over the past week I would want her to continue. I did also review the results of her bone culture which revealed group B strep as the responsible organism. Again this is what's causing her osteomyelitis she does have infectious disease appointment scheduled for 10/11/17 10/08/1898 valuation today patient appears to be doing better for the most part in regard to the sacral wound. Overall she is showing signs of having granulation of the bone which is good news. There is an area towards the left of the wound where she does seem to be showing some signs of opening this it would be due to additional pressure to the area unfortunately. Jessica [name], Jessica [name]. ([id number]) There does not appear to be any evidence of infection spreading which is good news that do not see any evidence of significant purulent discharge which is also good news. 10/26/17 on evaluation today patient sacral ulcer actually appears to be very healthy and doing well in regard to the overall appearance of the wound. With that being said she does seem to be having some issues at this point in time with some shear/friction injury of the sacrum from where she was transported to [facility] for her infectious disease appointment. She has been placed on IV antibiotic therapy I will have to go back and find that note for review as I did not have access to that today. Nonetheless I will add the next visit be sure to document the exact antibiotics that she is taking at this point. Nonetheless I do believe the wound is doing better there's no bone exposure nothing that requires debridement in regard to the sacral wound. Likewise her left lateral lower extremity ulcer also appears to be doing significantly better. In general I feel like things are showing signs of improvement all around. 11/12/17 on  evaluation today patient appears to be doing rather well in regard to her sacral wound. In general I do see signs of granulation at this point. Fortunately there does not appear to be any evidence of infection which is good news as well. In general overall happy with the appearance. With that being said I'll be see I would like for this to be progressing more rapidly as with the patient and her son but nonetheless at least things do not appear to be doing worse. Her lower extremity ulcer is doing significantly better. 11/26/17 on evaluation today patient appears to be doing a little better in regard to the sacral wound especially in the peripheral of the wound bed. She actually did have an abrasion on the right gluteal region that's completely resolved to this point. Her lower extremity also appears to be for the most part completely resolved. Overall the sacrum itself seems to be the one thing that is  still open and given her trouble. No fevers, chills, nausea, or vomiting noted at this time. She actually has an appointment next week with infectious disease for recheck to see how things are doing. She maybe switch to oral medications at that point. 12/09/17 on evaluation today patient actually appears to be doing much better in regard to her sacral wound. I do feel like she has made good progress at this time as far as healing is concerned. In fact the wound looks better to me today than it has in quite some time. She is on oral antibiotic therapy and no longer on the IV antibiotics. She did see infectious disease last week. 12/24/17 on evaluation today patient's wounds actually appear to be doing decently well in regard to the sacral wound in particular. When it comes to her lower extremity ulcers both appear to be completely healed at this point in the one area where she had lived the rash has completely resolved at this time. Overall very pleased with the progress that has been made. Nonetheless  the patient seems to be doing well in general. She still does have quite a bit of healing to do in regard to the sacral wound but I feel like she is making progress. 01/07/18 on evaluation today patient actually appears to be doing excellent today regard to her sacral wound. The granular quality in the base of the wound is excellent and shown signs of good improvement there's no evidence of contusion nor deep tissue injury and the periwound actually appears to be doing well. In general I'm very pleased with the overall appearance. 02/04/18 on evaluation today patient's wound actually appears to be doing decently well as far as the granulation is concerned. She has been tolerating the dressing changes without complication. Right now we are still using the Wound VAC. Nonetheless overall there apparently is some cater to the wound and this is concerning simply for the fact that she could be developing a soft tissue infection again. She's had this happen in the past and at that time responded very well to the antibiotics. Nonetheless I don't think I would likely put her on anything prophylactically but rather wait for the results of the culture to return. 02/18/18 on evaluation today patient actually appears to be doing fairly well. She has been tolerating the dressing changes. And the Wound VAC seems to be doing well. Overall I'm very pleased with how things have improved over the past couple weeks since I last saw her. Fortunately there does not appear to be any evidence of overt infection at this time which is good news. She has been taking the antibiotics without complication. Specifically that is the Bactrim DS which was prescribed on 02/08/18. 03/18/18 on evaluation today patient's wound actually does have quite a bit of odor noted compared to previous. With that being said the wound itself overall does not appear to be too significant or worse. There is no event noted in the wound bed which is good  news. With that being said the patient has been worried as the home health nurse stated that she thought she saw bone in the base of the wound. Nonetheless there's a little bit of bruising along the left lateral border of the wound bed which again does not appear to be terrible we have noticed this before but other than this the majority the wound bed appears to be doing excellent in my pinion. I am wondering if there may be some bacterial colonization. This  could be leading to some of the odor that we are noting with the Wound VAC. Jessica [name], Jessica [name] ([id number]) Electronic Signature(s) Signed: 03/21/2018 12:50:09 AM By: Lenda Kelp PA-C Entered By: Lenda Kelp on 03/21/2018 00:44:08 Jessica [name] ([id number]) -------------------------------------------------------------------------------- Physical Exam Details [name]. Date of Service: 03/18/2018 10:00 AM Medical Record Number: [id number] Patient Account Number: [id number] Date of Birth/Sex: 1946/03/18 (72 y.o. F) Treating RN: Curtis Sites Primary Care Provider: Annita Brod Other Clinician: Referring Provider: Annita Brod Treating Provider/Extender: STONE III, HOYT Weeks in Treatment: 42 Constitutional Well-nourished and well-hydrated in no acute distress. Respiratory normal breathing without difficulty. clear to auscultation bilaterally. Cardiovascular regular rate and rhythm with normal S1, S2. Psychiatric this patient is able to make decisions and demonstrates good insight into disease process. Alert and Oriented x 3. pleasant and cooperative. Notes Currently patient has no evidence of infection at this time which is great news. Especially nothing that appears to be systemically infected. Her extremities still seem to be doing excellent. Overall I'm very pleased. With that being said I think that it may be worth going back to the Dakinos soaked gauze packing as this seemed to do well for  the patient in the past and I think would aid in helping with some of the odor emanated from the wound. The primary action been cutting back on the amount of bacterial colonization. Electronic Signature(s) Signed: 03/21/2018 12:50:09 AM By: Lenda Kelp PA-C Entered By: Lenda Kelp on 03/21/2018 00:45:07 Jessica [name] ([id number]) -------------------------------------------------------------------------------- Physician Orders Details [name]. Date of Service: 03/18/2018 10:00 AM Medical Record Number: [id number] Patient Account Number: [id number] Date of Birth/Sex: Sep 06, 1945 (72 y.o. F) Treating RN: Curtis Sites Primary Care Provider: Annita Brod Other Clinician: Referring Provider: Annita Brod Treating Provider/Extender: Linwood Dibbles, HOYT Weeks in Treatment: 70 Verbal / Phone Orders: No Diagnosis Coding ICD-10 Coding Code Description L89.154 Pressure ulcer of sacral region, stage 4 M48.00 Spinal stenosis, site unspecified R54 Age-related physical debility G89.4 Chronic pain syndrome E78.2 Mixed hyperlipidemia L89.93 Pressure ulcer of unspecified site, stage 3 Wound Cleansing Wound #1 Medial Sacrum o Other: - please cleanse sacral wound with dakins moistened gauze - do not spray dakins on wound Anesthetic (add to Medication List) Wound #1 Medial Sacrum o Topical Lidocaine 4% cream applied to wound bed prior to debridement (In Clinic Only). Skin Barriers/Peri-Wound Care Wound #1 Medial Sacrum o Skin Prep Primary Wound Dressing Wound #1 Medial Sacrum o Pack wound with: - Dakin's soaked gauze for the next 2 weeks Secondary Dressing Wound #1 Medial Sacrum o ABD pad - over xtrasorb and secure with tape o XtraSorb - HHRN TO PROVIDE XTRASORB FOR PATIENT AS IT IS NECESSARY FOR WOUND HEALING AND MOISTURE CONTROL Dressing Change Frequency Wound #1 Medial Sacrum o Change dressing every day. - and as needed Follow-up  Appointments Wound #1 Medial Sacrum o Return Appointment in 2 weeks. Off-Loading Jessica [name], Jessica [name] ([id number]) Wound #1 Medial Sacrum o Turn and reposition every 2 hours Additional Orders / Instructions Wound #1 Medial Sacrum o Increase protein intake. Home Health Wound #1 Medial Sacrum o Continue Home Health Visits - Advanced - HHRN TO PROVIDE XTRASORB FOR PATIENT AS IT IS NECESSARY FOR WOUND HEALING AND MOISTURE CONTROL o Home Health Nurse may visit PRN to address patientos wound care needs. o FACE TO FACE ENCOUNTER: MEDICARE and MEDICAID PATIENTS: I certify that this patient is under my care and that I had a face-to-face encounter that  meets the physician face-to-face encounter requirements with this patient on this date. The encounter with the patient was in whole or in part for the following MEDICAL CONDITION: (primary reason for Home Healthcare) MEDICAL NECESSITY: I certify, that based on my findings, NURSING services are a medically necessary home health service. HOME BOUND STATUS: I certify that my clinical findings support that this patient is homebound (i.e., Due to illness or injury, pt requires aid of supportive devices such as crutches, cane, wheelchairs, walkers, the use of special transportation or the assistance of another person to leave their place of residence. There is a normal inability to leave the home and doing so requires considerable and taxing effort. Other absences are for medical reasons / religious services and are infrequent or of short duration when for other reasons). o If current dressing causes regression in wound condition, may D/C ordered dressing product/s and apply Normal Saline Moist Dressing daily until next Wound Healing Center / Other MD appointment. Notify Wound Healing Center of regression in wound condition at [phone]. o Please direct any NON-WOUND related issues/requests for orders to patient's Primary Care  Physician Negative Pressure Wound Therapy Wound #1 Medial Sacrum o Place NPWT on HOLD. - FOR THE NEXT 2 WEEKS Electronic Signature(s) Signed: 03/18/2018 3:45:44 PM By: Curtis Sites Signed: 03/21/2018 12:50:09 AM By: Lenda Kelp PA-C Entered By: Curtis Sites on 03/18/2018 11:27:48 Jessica [name] ([id number]) -------------------------------------------------------------------------------- Problem List Details [name]. Date of Service: 03/18/2018 10:00 AM Medical Record Number: [id number] Patient Account Number: [id number] Date of Birth/Sex: [birth year] (72 y.o. F) Treating RN: Curtis Sites Primary Care Provider: Annita Brod Other Clinician: Referring Provider: Annita Brod Treating Provider/Extender: Linwood Dibbles, HOYT Weeks in Treatment: 42 Active Problems ICD-10 Evaluated Encounter Code Description Active Date Today Diagnosis L89.154 Pressure ulcer of sacral region, stage 4 05/27/2017 No Yes M48.00 Spinal stenosis, site unspecified 05/27/2017 No Yes R54 Age-related physical debility 05/27/2017 No Yes G89.4 Chronic pain syndrome 05/27/2017 No Yes E78.2 Mixed hyperlipidemia 05/27/2017 No Yes L89.93 Pressure ulcer of unspecified site, stage 3 05/27/2017 No Yes Inactive Problems Resolved Problems Electronic Signature(s) Signed: 03/21/2018 12:50:09 AM By: Lenda Kelp PA-C Entered By: Lenda Kelp on 03/18/2018 10:22:27 Jessica [name] ([id number]) -------------------------------------------------------------------------------- Progress Note Details [name]. Date of Service: 03/18/2018 10:00 AM Medical Record Number: [id number] Patient Account Number: [id number] Date of Birth/Sex: Nov 01, 1945 (72 y.o. F) Treating RN: Curtis Sites Primary Care Provider: Annita Brod Other Clinician: Referring Provider: Annita Brod Treating Provider/Extender: Linwood Dibbles, HOYT Weeks in Treatment: 42 Subjective Chief  Complaint Information obtained from Patient she is here for evaluation of a sacral ulcer and bilateral lower extremity ulcers History of Present Illness (HPI) 05/27/17-she is here in initial evaluation for a left-sided sacral stage IV pressure ulcer and bilateral lower extremity, lateral aspect, unstageable pressure ulcers. She is accompanied by her husband and her son, who are her primary caregivers. She is bedbound secondary to spinal stenosis. According to her son and husband she was hospitalized from 10/5-11/1 with healthcare associated pneumonia and altered mental status. During her hospitalization she was intubated, extubated on 10/27. She was discharged with Foley catheter and follow up with urology. According to her son and spouse she developed the sacral ulcer during hospitalization. Home health has been applying Santyl daily. She does have a low air loss mattress and is repositioned every 2-3 hours per her family's report. According to the son and spouse she had an appointment with urology on 12/18  and during that appointment developed discoloration to her bilateral lower extremities which ultimately developed into unstageable pressure ulcers to the lateral aspects of her bilateral lower extremities. There has been no topical treatment applied to these. She continues to have home health. There is no concerns expressed regarding dietary intake, stating she eats 3 meals a day, eating was provided; she is supplemented with boost with protein. 06/03/17-she is here in follow-up evaluation for sacral and bilateral lower extremity ulcers. Plain film x-ray done today reveals no distraction to the sacrum or coccyx, no visible abnormalities. Home health has ordered the negative pressure wound system but it has not been initiated. We will continue with Hydrofera Blue until initiation of negative pressure wound system and continue with Santyl to bilateral lower extremity ulcers. Follow-up next  week 06/10/17-she is here in follow-up evaluation for sacral and bilateral lower extremity ulcers. The wound VAC will be available tomorrow per home health. We will initiate wound VAC therapy to the sacral ulcer 3 times weekly (Thursday, Saturday/Sunday, Tuesday). We will continue with Santyl to the lower extremity ulcers. The patient's son is checking into home health therapy over the weekend for [facility] changes, with the understanding that if VAC changes cannot be performed over the weekend he will need to change his mother's appointments to Monday, Wednesday or Friday. The sacral ulcer clinically does not appear infected but there has been a change in the amount of drainage acutely, there is no significant amount of devitalized tissue, there is no malodor. Wound culture was obtained to evaluate for occult infection we will hold off on antibiotic therapy until sensitivities are resulted. 06/18/17 on evaluation today patient appears to be doing fairly well in my opinion although this is the first time I have seen this patient she has been previously evaluated by Tacey Ruiz here in our office. She is going to be switching to Fridays to see me due to the Wound VAC schedule being changed on Monday, Wednesday, and Friday. Subsequently she seems to be doing fairly well with the Wound VAC. Her son who was present during evaluation today states that he somewhat stresses of the Wound VAC in making sure that it was functioning appropriately. With that being said everything seems to be working well he knows what settings on the [facility] itself to look at and ensure that it is functioning properly. Overall the wound appears to be nice and clean there is no need for debridement today. She has no discomfort in her bilateral lower extremity ulcers also appear to be improving based on measurements and what this honest tell me about the overall appearance. 07/02/17 on evaluation today I noted in patients wound bed that was actually  an odor that had not previously been noted. Subsequently there was also a small area of bone that was not previously noted during my last evaluation. This did appear to be necrotic and was being somewhat forced out by the body around the region of granulation. She does not have any pain which is good news. Nonetheless the overall appearance of the ulcer is making me concerned for the patient having developed osteomyelitis. Currently she is not been on any antibiotics and the Wound VAC has been doing fairly well in general. With that being said I do not think we need to continue the Wound VAC if she potentially has a bone infection. At least not until it is properly addressed with antibiotics. Jessica [name], Jessica [name] ([id number]) 07/09/17 on evaluation today patient appears to actually be doing  very well in regard to her sacral ulcer compared to last week. There is actually no exposed bone at this point. 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 With that being said still I do believe she needs to have a referral to infectious disease due to the osteomyelitis I am gonna recommend that she continue with the doxycycline based on the culture results which showed Eikenella Corrodens as the organism identified that tetracycline should work for. She does not seem to be having any discomfort whatsoever at this point. 07/16/17 on evaluation today patient actually appears to be doing rather well in regard to her sacral wound. I think that the original wound site actually appears much better than previously noted. With that being said she does have a new superficial injury on the right sacral region which appears to be due to according to the sign transport that occurred for her MRI unfortunately. Fortunately this is not too deep and I do think it can be  managed but it was a new area that was not previously noted. Otherwise she has been tolerating the Dakin s soaked gauze packing without complication. 07/30/17 on evaluation today patient presents for reevaluation concerning her sacral wound. She has been tolerating the dressing changes without complication. With that being said her wound is doing so well at this point that I think she may be at the point where we could reinitiate the Wound VAC currently and hopefully see good results from this. I'm very pleased with how she has [facility]. 08/13/17 evaluation today patient appears to be doing excellent in regard to her lower extremity ulcer this seems to be cleaning up very nicely. In regard to the sacral ulcer the area of trauma on the right lateral portion of the wound actually appears to be much better than previously noted during the last evaluation. The word has definitely filled in and the Wound VAC appears to be helpful I do believe. Overall I'm very pleased with how things seem to be progressing at this time. Patient likewise is also happy that things are doing well. 08/27/17 on evaluation today patient presents for follow-up concerning her ongoing issues with her sacral ulcer and lower extremity ulcer. Fortunately she has been tolerating the dressing changes well complication the Wound VAC in general seems to have done very well up to this point. She does not have any evidence of infection which is good news. She does have some dark discoloration in central portion of the wound which is troublesome for the possibility of pressure injury to the site it sounds like her prior air mattress was not functioning properly her son has just bought a new one for her this is doing much better. Otherwise things seem to be progressing nicely. 09/10/17 on evaluation today patient presents for follow-up concerning her sacral wound and lower extremity ulcer. She has been  tolerating the switch of her dressing changes to the Dakin s soaked gauze dressing very well in regard to the sacrum. The left lateral lower extremity ulcer appears to have a new area open just distal to the one that we have been treating with Santyl and this is new since her last visit. There does not appear to be any evidence of infection which is good news. With that being said there's a lot of maceration  here at the site and I feel this is likely due to the switch and the dressings it appears that due to the sticking of the dressings it will switch to utilizing a telfa pad over the Santyl. I think this caused a lot of drainage to be collected and situated in the region just under the dressing which has led to this causing which duration of breakdown. Overall there does not appear to be any significant pain which is good news. Of note I did actually have a conversation with the radiologist who is an interventional radiologist with [facility]. 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 With that being said there does not appear to be any rush to get this done according to the interventional radiologist whom I spoke with. Therefore after discussion it was determined that there gonna wait about six months prior to considering the procedure to get time for hopefully the sacral wound to heal in the interim to at least some degree. 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 According to nursing staff these are all improved, the negative pressure wound therapy system was put on hold last week. We will resume negative pressure wound therapy today, continue with Santyl to the lower extremity and she will follow-up next week. 09/24/17 on evaluation today unfortunately the patient's wound appears to be doing significantly worse compared to last week's evaluation and even my  valuation the week before. She actually has bone noted on the left wound margin in the region of undermining unfortunately. This was not noted during the last evaluation. I am concerned about the fact that this seems to be worse not better since our last visit with her. My biggest concern is that she's likely developing a worsening osteomyelitis. This was discussed with patient and her son today during the office visit. 05/03/17 on evaluation today patient actually appears to be doing rather well in regard to her sacral ulcer compared to last week's evaluation. She has been tolerating the dressing changes without complication. Fortunately even though we're not utilizing the Wound VAC she seems to be making some strides in seeing the wound area overall improved quite dramatically in my pinion in just one weeks time. She also seems to be staying off of this to the point that I do not see any evidence of new injury which is excellent news. Whatever she's been doing in that regard over the past week I would want her to continue. Jessica [name], Jessica [name] ([id number]) did also review the results of her bone culture which revealed group B strep as the responsible organism. Again this is what's causing her osteomyelitis she does have infectious disease appointment scheduled for 10/11/17 10/08/1898 valuation today patient appears to be doing better for the most part in regard to the sacral wound. Overall she is showing signs of having granulation of the bone which is good news. There is an area towards the left of the wound where she does seem to be showing some signs of opening this it would be due to additional pressure to the area unfortunately. There does not appear to be any evidence of infection spreading which is good news that do not see any evidence of significant purulent discharge which is also good news. 10/26/17 on evaluation today patient sacral ulcer actually appears to be very healthy and doing well  in regard to the overall appearance  of the wound. With that being said she does seem to be having some issues at this point in time with some shear/friction injury of the sacrum from where she was transported to [facility] for her infectious disease appointment. She has been placed on IV antibiotic therapy I will have to go back and find that note for review as I did not have access to that today. Nonetheless I will add the next visit be sure to document the exact antibiotics that she is taking at this point. Nonetheless I do believe the wound is doing better there's no bone exposure nothing that requires debridement in regard to the sacral wound. Likewise her left lateral lower extremity ulcer also appears to be doing significantly better. In general I feel like things are showing signs of improvement all around. 11/12/17 on evaluation today patient appears to be doing rather well in regard to her sacral wound. In general I do see signs of granulation at this point. Fortunately there does not appear to be any evidence of infection which is good news as well. In general overall happy with the appearance. With that being said I'll be see I would like for this to be progressing more rapidly as with the patient and her son but nonetheless at least things do not appear to be doing worse. Her lower extremity ulcer is doing significantly better. 11/26/17 on evaluation today patient appears to be doing a little better in regard to the sacral wound especially in the peripheral of the wound bed. She actually did have an abrasion on the right gluteal region that's completely resolved to this point. Her lower extremity also appears to be for the most part completely resolved. Overall the sacrum itself seems to be the one thing that is still open and given her trouble. No fevers, chills, nausea, or vomiting noted at this time. She actually has an appointment next week with infectious disease for recheck to see how  things are doing. She maybe switch to oral medications at that point. 12/09/17 on evaluation today patient actually appears to be doing much better in regard to her sacral wound. I do feel like she has made good progress at this time as far as healing is concerned. In fact the wound looks better to me today than it has in quite some time. She is on oral antibiotic therapy and no longer on the IV antibiotics. She did see infectious disease last week. 12/24/17 on evaluation today patient's wounds actually appear to be doing decently well in regard to the sacral wound in particular. When it comes to her lower extremity ulcers both appear to be completely healed at this point in the one area where she had lived the rash has completely resolved at this time. Overall very pleased with the progress that has been made. Nonetheless the patient seems to be doing well in general. She still does have quite a bit of healing to do in regard to the sacral wound but I feel like she is making progress. 01/07/18 on evaluation today patient actually appears to be doing excellent today regard to her sacral wound. The granular quality in the base of the wound is excellent and shown signs of good improvement there's no evidence of contusion nor deep tissue injury and the periwound actually appears to be doing well. In general I'm very pleased with the overall appearance. 02/04/18 on evaluation today patient's wound actually appears to be doing decently well as far as the granulation is concerned. She  has been tolerating the dressing changes without complication. Right now we are still using the Wound VAC. Nonetheless overall there apparently is some cater to the wound and this is concerning simply for the fact that she could be developing a soft tissue infection again. She's had this happen in the past and at that time responded very well to the antibiotics. Nonetheless I don't think I would likely put her on anything  prophylactically but rather wait for the results of the culture to return. 02/18/18 on evaluation today patient actually appears to be doing fairly well. She has been tolerating the dressing changes. And the Wound VAC seems to be doing well. Overall I'm very pleased with how things have improved over the past couple weeks since I last saw her. Fortunately there does not appear to be any evidence of overt infection at this time which is good news. She has been taking the antibiotics without complication. Specifically that is the Bactrim DS which was prescribed on 02/08/18. 03/18/18 on evaluation today patient's wound actually does have quite a bit of odor noted compared to previous. With that Jessica [name], Jessica [name]. ([id number]) being said the wound itself overall does not appear to be too significant or worse. There is no event noted in the wound bed which is good news. With that being said the patient has been worried as the home health nurse stated that she thought she saw bone in the base of the wound. Nonetheless there's a little bit of bruising along the left lateral border of the wound bed which again does not appear to be terrible we have noticed this before but other than this the majority the wound bed appears to be doing excellent in my pinion. I am wondering if there may be some bacterial colonization. This could be leading to some of the odor that we are noting with the Wound VAC. Patient History Information obtained from Patient. Family History No family history of Cancer, Diabetes, Heart Disease. Social History Never smoker, Marital Status - Married. Review of Systems (ROS) Constitutional Symptoms (General Health) Denies complaints or symptoms of Fever, Chills. Respiratory The patient has no complaints or symptoms. Cardiovascular The patient has no complaints or symptoms. Psychiatric The patient has no complaints or symptoms. Objective Constitutional Well-nourished and  well-hydrated in no acute distress. Vitals Time Taken: 10:35 AM, Height: 70 in, Temperature: 98.1 F, Pulse: 79 bpm, Respiratory Rate: 18 breaths/min, Blood Pressure: 143/58 mmHg. Respiratory normal breathing without difficulty. clear to auscultation bilaterally. Cardiovascular regular rate and rhythm with normal S1, S2. Psychiatric this patient is able to make decisions and demonstrates good insight into disease process. Alert and Oriented x 3. pleasant and cooperative. General Notes: Currently patient has no evidence of infection at this time which is great news. Especially nothing that appears to be systemically infected. Her extremities still seem to be doing excellent. Overall I'm very pleased. With that being said I think that it may be worth going back to the [place] s soaked gauze packing as this seemed to do well for the patient in the past and I think would aid in helping with some of the odor emanated from the wound. The primary action been cutting back on the [place], Jessica [name]. ([id number]) amount of bacterial colonization. Integumentary (Hair, Skin) Wound #1 status is Open. Original cause of wound was Pressure Injury. The wound is located on the Medial Sacrum. The wound measures 6.8cm length x 2.8cm width x 2.7cm depth; 14.954cm^2 area and 40.376cm^3 volume. There is  muscle, Fat Layer (Subcutaneous Tissue) Exposed, and fascia exposed. There is no undermining noted, however, there is tunneling at 11:00 with a maximum distance of 5.1cm. There is a large amount of serosanguineous drainage noted. Foul odor after cleansing was noted. The wound margin is distinct with the outline attached to the wound base. There is medium (34-66%) red, pink, hyper - granulation within the wound bed. There is a medium (34-66%) amount of necrotic tissue within the wound bed including Eschar and Adherent Slough. The periwound skin appearance exhibited: Induration, Scarring, Rubor, Erythema. The periwound  skin appearance did not exhibit: Callus, Crepitus, Excoriation, Rash, Dry/Scaly, Maceration, Atrophie Blanche, Cyanosis, Ecchymosis, Hemosiderin Staining, Mottled, Pallor. The surrounding wound skin color is noted with erythema which is circumferential. Periwound temperature was noted as No Abnormality. The periwound has tenderness on palpation. 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 Skin Barriers/Peri-Wound Care: Wound #1 Medial Sacrum: Skin Prep Primary Wound Dressing: Wound #1 Medial Sacrum: Pack wound with: - Dakin's soaked gauze for the next 2 weeks Secondary Dressing: Wound #1 Medial Sacrum: ABD pad - over xtrasorb and secure with tape XtraSorb - HHRN TO PROVIDE XTRASORB FOR PATIENT AS IT IS NECESSARY FOR WOUND HEALING AND MOISTURE CONTROL Dressing Change Frequency: Wound #1 Medial Sacrum: Change dressing every day. - and as needed Follow-up Appointments: Jessica [name], Jessica [name] ([id number]) Wound #1 Medial Sacrum: Return Appointment in 2 weeks. Off-Loading: Wound #1 Medial Sacrum: Turn and reposition every 2 hours Additional Orders / Instructions: Wound #1 Medial Sacrum: Increase protein intake. Home Health: Wound #1 Medial Sacrum: Continue Home Health Visits - Advanced - HHRN TO PROVIDE XTRASORB FOR PATIENT AS IT IS NECESSARY FOR WOUND HEALING AND MOISTURE CONTROL Home Health Nurse may visit PRN to address patient s wound care needs. FACE TO FACE ENCOUNTER: MEDICARE and MEDICAID PATIENTS: I  certify that this patient is under my care and that I had a face-to-face encounter that meets the physician face-to-face encounter requirements with this patient on this date. The encounter with the patient was in whole or in part for the following MEDICAL CONDITION: (primary reason for Home Healthcare) MEDICAL NECESSITY: I certify, that based on my findings, NURSING services are a medically necessary home health service. HOME BOUND STATUS: I certify that my clinical findings support that this patient is homebound (i.e., Due to illness or injury, pt requires aid of supportive devices such as crutches, cane, wheelchairs, walkers, the use of special transportation or the assistance of another person to leave their place of residence. There is a normal inability to leave the home and doing so requires considerable and taxing effort. Other absences are for medical reasons / religious services and are infrequent or of short duration when for other reasons). If current dressing causes regression in wound condition, may D/C ordered dressing product/s and apply Normal Saline Moist Dressing daily until next Wound Healing Center / Other MD appointment. Notify Wound Healing Center of regression in wound condition at [phone]. Please direct any NON-WOUND related issues/requests for orders to patient's Primary Care Physician Negative Pressure Wound Therapy: Wound #1 Medial Sacrum: Place NPWT  on HOLD. - FOR THE NEXT 2 WEEKS We are going to go ahead and initiate the above wound care measures the patient is in agreement with that plan. Her son is in agreement as well. Subsequently we will see were things stand at follow-up. I will be in two weeks time. Please see above for specific wound care orders. We will see patient for re-evaluation in 1 week(s) here in the clinic. If anything worsens or changes patient will contact our office for additional recommendations. Electronic Signature(s) Signed: 03/21/2018  12:50:09 AM By: Lenda Kelp PA-C Entered By: Lenda Kelp on 03/21/2018 00:45:37 Jessica [name] ([id number]) -------------------------------------------------------------------------------- ROS/PFSH Details [name]. Date of Service: 03/18/2018 10:00 AM Medical Record Number: [id number] Patient Account Number: [id number] Date of Birth/Sex: 02/28/1946 (72 y.o. F) Treating RN: Curtis Sites Primary Care Provider: Annita Brod Other Clinician: Referring Provider: Annita Brod Treating Provider/Extender: STONE III, HOYT Weeks in Treatment: 42 Information Obtained From Patient Wound History Constitutional Symptoms (General Health) Complaints and Symptoms: Negative for: Fever; Chills Respiratory Complaints and Symptoms: No Complaints or Symptoms Cardiovascular Complaints and Symptoms: No Complaints or Symptoms Psychiatric Complaints and Symptoms: No Complaints or Symptoms Immunizations Pneumococcal Vaccine: Received Pneumococcal Vaccination: No Implantable Devices Family and Social History Cancer: No; Diabetes: No; Heart Disease: No; Never smoker; Marital Status - Married Physician Affirmation I have reviewed and agree with the above information. Electronic Signature(s) Signed: 03/21/2018 12:50:09 AM By: Lenda Kelp PA-C Signed: 03/21/2018 4:59:42 PM By: Curtis Sites Entered By: Lenda Kelp on 03/21/2018 00:44:24 Jessica [name] ([id number]) -------------------------------------------------------------------------------- SuperBill Details [name]. Date of Service: 03/18/2018 Medical Record Number: [id number] Patient Account Number: [id number] Date of Birth/Sex: 1945/07/29 (72 y.o. 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 Pressure ulcer of sacral region, stage 4 M48.00 Spinal stenosis, site unspecified R54 Age-related physical debility G89.4 Chronic pain syndrome E78.2 Mixed hyperlipidemia L89.93 Pressure ulcer of unspecified site, stage 3 Facility Procedures CPT4 Code: 62952841 Description: 99213 - WOUND CARE VISIT-LEV 3 EST PT Modifier: Quantity: 1 Physician Procedures CPT4 Code: 3244010 Description: 99214 - WC PHYS LEVEL 4 - EST PT ICD-10 Diagnosis Description 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

## 2018-03-24 NOTE — Progress Notes (Signed)
LYNDE, LUDWIG ([id number]) Visit Report for 03/18/2018 Arrival Information Details Patient Name: MAKAYLYN, SINYARD. Date of Service: 03/18/2018 10:00 AM Medical Record Number: [id number] Patient Account Number: [id number] Date of Birth/Sex: 1945/09/01 (72 y.o. F) Treating RN: Arnette Norris Primary Care Rushil Kimbrell: Annita Brod Other Clinician: Referring Wenona Mayville: Annita Brod Treating Burkley Dech/Extender: Linwood Dibbles, HOYT Weeks in Treatment: 42 Visit Information History Since Last Visit Added or deleted any medications: No Patient Arrived: Stretcher Any new allergies or adverse reactions: No Arrival Time: 10:34 Had a fall or experienced change in No Accompanied By: son activities of daily living that may affect Transfer Assistance: Stretcher risk of falls: Patient Identification Verified: Yes Signs or symptoms of abuse/neglect since last visito No Secondary Verification Process Yes Hospitalized since last visit: No Completed: Has Dressing in Place as Prescribed: Yes Patient Has Alerts: Yes Pain Present Now: No Patient Alerts: ALLERGIC TO ZINC Electronic Signature(s) Signed: 03/22/2018 5:05:35 PM By: Arnette Norris Entered By: Arnette Norris on 03/18/2018 10:35:38 [name] ([id number]) -------------------------------------------------------------------------------- Clinic Level of Care Assessment Details Patient Name: [name]. Date of Service: 03/18/2018 10:00 AM Medical Record Number: [id number] Patient Account Number: [id number] Date of Birth/Sex: 1945/07/16 (72 y.o. F) Treating RN: Curtis Sites Primary Care Shandee Jergens: Annita Brod Other Clinician: Referring Quanta Robertshaw: Annita Brod Treating Massimiliano Rohleder/Extender: Linwood Dibbles, HOYT Weeks in Treatment: 42 Clinic Level of Care Assessment Items TOOL 4 Quantity Score []  - Use when only an EandM is performed on FOLLOW-UP visit 0 ASSESSMENTS - Nursing Assessment / Reassessment X - Reassessment of  Co-morbidities (includes updates in patient status) 1 10 X- 1 5 Reassessment of Adherence to Treatment Plan ASSESSMENTS - Wound and Skin Assessment / Reassessment X - Simple Wound Assessment / Reassessment - one wound 1 5 []  - 0 Complex Wound Assessment / Reassessment - multiple wounds []  - 0 Dermatologic / Skin Assessment (not related to wound area) ASSESSMENTS - Focused Assessment []  - Circumferential Edema Measurements - multi extremities 0 []  - 0 Nutritional Assessment / Counseling / Intervention []  - 0 Lower Extremity Assessment (monofilament, tuning fork, pulses) []  - 0 Peripheral Arterial Disease Assessment (using hand held doppler) ASSESSMENTS - Ostomy and/or Continence Assessment and Care []  - Incontinence Assessment and Management 0 []  - 0 Ostomy Care Assessment and Management (repouching, etc.) PROCESS - Coordination of Care X - Simple Patient / Family Education for ongoing care 1 15 []  - 0 Complex (extensive) Patient / Family Education for ongoing care []  - 0 Staff obtains Chiropractor, Records, Test Results / Process Orders []  - 0 Staff telephones HHA, Nursing Homes / Clarify orders / etc []  - 0 Routine Transfer to another Facility (non-emergent condition) []  - 0 Routine Hospital Admission (non-emergent condition) []  - 0 New Admissions / Manufacturing engineer / Ordering NPWT, Apligraf, etc. []  - 0 Emergency Hospital Admission (emergent condition) X- 1 10 Simple Discharge Coordination SINDI, BECKWORTH. ([id number]) []  - 0 Complex (extensive) Discharge Coordination PROCESS - Special Needs []  - Pediatric / Minor Patient Management 0 []  - 0 Isolation Patient Management []  - 0 Hearing / Language / Visual special needs []  - 0 Assessment of Community assistance (transportation, D/C planning, etc.) []  - 0 Additional assistance / Altered mentation []  - 0 Support Surface(s) Assessment (bed, cushion, seat, etc.) INTERVENTIONS - Wound Cleansing / Measurement X  - Simple Wound Cleansing - one wound 1 5 []  - 0 Complex Wound Cleansing - multiple wounds X- 1 5 Wound Imaging (photographs - any number of wounds) []  - 0 Wound  Tracing (instead of photographs) X- 1 5 Simple Wound Measurement - one wound []  - 0 Complex Wound Measurement - multiple wounds INTERVENTIONS - Wound Dressings X - Small Wound Dressing one or multiple wounds 1 10 []  - 0 Medium Wound Dressing one or multiple wounds []  - 0 Large Wound Dressing one or multiple wounds X- 1 5 Application of Medications - topical []  - 0 Application of Medications - injection INTERVENTIONS - Miscellaneous []  - External ear exam 0 []  - 0 Specimen Collection (cultures, biopsies, blood, body fluids, etc.) []  - 0 Specimen(s) / Culture(s) sent or taken to Lab for analysis []  - 0 Patient Transfer (multiple staff / Nurse, adultHoyer Lift / Similar devices) []  - 0 Simple Staple / Suture removal (25 or less) []  - 0 Complex Staple / Suture removal (26 or more) []  - 0 Hypo / Hyperglycemic Management (close monitor of Blood Glucose) []  - 0 Ankle / Brachial Index (ABI) - do not check if billed separately X- 1 5 Vital Signs Bartolotta, Marieann W. ([id number]) Has the patient been seen at the hospital within the last three years: Yes Total Score: 80 Level Of Care: New/Established - Level 3 Electronic Signature(s) Signed: 03/18/2018 3:45:44 PM By: Curtis Sitesorthy, Joanna Entered By: Curtis Sitesorthy, Joanna on 03/18/2018 11:30:50 [name], Delphine W. ([id number]) -------------------------------------------------------------------------------- Lower Extremity Assessment Details Patient Name: [name], Ethan W. Date of Service: 03/18/2018 10:00 AM Medical Record Number: [id number] Patient Account Number: [id number] Date of Birth/Sex: 1945/05/27 (72 y.o. F) Treating RN: Arnette NorrisBiell, Kristina Primary Care Noeh Sparacino: Annita BrodASENSO, PHILIP Other Clinician: Referring Abriana Saltos: Annita BrodASENSO, PHILIP Treating Aditya Nastasi/Extender: Linwood DibblesSTONE III, HOYT Weeks in  Treatment: 42 Electronic Signature(s) Signed: 03/22/2018 5:05:35 PM By: Arnette NorrisBiell, Kristina Entered By: Arnette NorrisBiell, Kristina on 03/18/2018 10:47:39 [name], Lenzi W. ([id number]) -------------------------------------------------------------------------------- Multi Wound Chart Details Patient Name: [name], Chelcy W. Date of Service: 03/18/2018 10:00 AM Medical Record Number: [id number] Patient Account Number: [id number] Date of Birth/Sex: 1945/05/27 (72 y.o. F) Treating RN: Curtis Sitesorthy, Joanna Primary Care Lilly Gasser: Annita BrodASENSO, PHILIP Other Clinician: Referring Rashea Hoskie: Annita BrodASENSO, PHILIP Treating Dorean Daniello/Extender: STONE III, HOYT Weeks in Treatment: 42 Vital Signs Height(in): 70 Pulse(bpm): 79 Weight(lbs): Blood Pressure(mmHg): 143/58 Body Mass Index(BMI): Temperature(F): 98.1 Respiratory Rate 18 (breaths/min): Photos: [1:No Photos] [N/A:N/A] Wound Location: [1:Sacrum - Medial] [N/A:N/A] Wounding Event: [1:Pressure Injury] [N/A:N/A] Primary Etiology: [1:Pressure Ulcer] [N/A:N/A] Date Acquired: [1:01/15/2017] [N/A:N/A] Weeks of Treatment: [1:42] [N/A:N/A] Wound Status: [1:Open] [N/A:N/A] Measurements L x W x D [1:6.8x2.8x2.7] [N/A:N/A] (cm) Area (cm) : [1:14.954] [N/A:N/A] Volume (cm) : [1:40.376] [N/A:N/A] % Reduction in Area: [1:59.40%] [N/A:N/A] % Reduction in Volume: [1:-9.60%] [N/A:N/A] Position 1 (o'clock): [1:11] Maximum Distance 1 (cm): [1:5.1] Tunneling: [1:Yes] [N/A:N/A] Classification: [1:Category/Stage IV] [N/A:N/A] Exudate Amount: [1:Large] [N/A:N/A] Exudate Type: [1:Serosanguineous] [N/A:N/A] Exudate Color: [1:red, brown] [N/A:N/A] Foul Odor After Cleansing: [1:Yes] [N/A:N/A] Odor Anticipated Due to [1:No] [N/A:N/A] Product Use: Wound Margin: [1:Distinct, outline attached] [N/A:N/A] Granulation Amount: [1:Medium (34-66%)] [N/A:N/A] Granulation Quality: [1:Red, Pink, Hyper-granulation] [N/A:N/A] Necrotic Amount: [1:Medium (34-66%)] [N/A:N/A] Necrotic Tissue:  [1:Eschar, Adherent Slough] [N/A:N/A] Exposed Structures: [1:Fat Layer (Subcutaneous Tissue) Exposed: Yes Muscle: Yes Bone: Yes Fascia: No Tendon: No Joint: No] [N/A:N/A] Epithelialization: [1:None] [N/A:N/A] Periwound Skin Texture: [N/A:N/A] Induration: Yes Scarring: Yes Excoriation: No Callus: No Crepitus: No Rash: No Periwound Skin Moisture: Maceration: No N/A N/A Dry/Scaly: No Periwound Skin Color: Erythema: Yes N/A N/A Rubor: Yes Atrophie Blanche: No Cyanosis: No Ecchymosis: No Hemosiderin Staining: No Mottled: No Pallor: No Erythema Location: Circumferential N/A N/A Temperature: No Abnormality N/A N/A Tenderness on Palpation: Yes N/A N/A Wound Preparation: Ulcer Cleansing: N/A N/A Rinsed/Irrigated  with Saline Topical Anesthetic Applied: Other: lidocaine 4% Treatment Notes Electronic Signature(s) Signed: 03/18/2018 3:45:44 PM By: Curtis Sites Entered By: Curtis Sites on 03/18/2018 11:20:19 [name] ([id number]) -------------------------------------------------------------------------------- Multi-Disciplinary Care Plan Details Patient Name: [name]. Date of Service: 03/18/2018 10:00 AM Medical Record Number: [id number] Patient Account Number: [id number] Date of Birth/Sex: 1945/12/09 (72 y.o. F) Treating RN: Curtis Sites Primary Care Evangelia Whitaker: Annita Brod Other Clinician: Referring Elma Limas: Annita Brod Treating Kemonte Ullman/Extender: STONE III, HOYT Weeks in Treatment: 42 Active Inactive Orientation to the Wound Care Program Nursing Diagnoses: Knowledge deficit related to the wound healing center program Goals: Patient/caregiver will verbalize understanding of the Wound Healing Center Program Date Initiated: 06/03/2017 Target Resolution Date: 06/17/2017 Goal Status: Active Interventions: Provide education on orientation to the wound center Notes: Pressure Nursing Diagnoses: Knowledge deficit related to causes and risk factors for  pressure ulcer development Knowledge deficit related to management of pressures ulcers Potential for impaired tissue integrity related to pressure, friction, moisture, and shear Goals: Patient will remain free from development of additional pressure ulcers Date Initiated: 06/03/2017 Target Resolution Date: 06/17/2017 Goal Status: Active Patient will remain free of pressure ulcers Date Initiated: 06/03/2017 Target Resolution Date: 06/17/2017 Goal Status: Active Patient/caregiver will verbalize risk factors for pressure ulcer development Date Initiated: 06/03/2017 Target Resolution Date: 06/17/2017 Goal Status: Active Interventions: Assess: immobility, friction, shearing, incontinence upon admission and as needed Assess potential for pressure ulcer upon admission and as needed Notes: Wound/Skin Impairment NUHA, DEGNER ([id number]) Nursing Diagnoses: Impaired tissue integrity Goals: Patient/caregiver will verbalize understanding of skin care regimen Date Initiated: 06/03/2017 Target Resolution Date: 06/17/2017 Goal Status: Active Ulcer/skin breakdown will have a volume reduction of 30% by week 4 Date Initiated: 06/03/2017 Target Resolution Date: 06/17/2017 Goal Status: Active Interventions: Assess ulceration(s) every visit Treatment Activities: Patient referred to home care : 06/03/2017 Skin care regimen initiated : 06/03/2017 Notes: Electronic Signature(s) Signed: 03/18/2018 3:45:44 PM By: Curtis Sites Entered By: Curtis Sites on 03/18/2018 11:20:07 [name] ([id number]) -------------------------------------------------------------------------------- Pain Assessment Details Patient Name: [name]. Date of Service: 03/18/2018 10:00 AM Medical Record Number: [id number] Patient Account Number: [id number] Date of Birth/Sex: 14-Jun-1945 (72 y.o. F) Treating RN: Arnette Norris Primary Care Cliffard Hair: Annita Brod Other Clinician: Referring Yoshiko Keleher: Annita Brod Treating Arber Wiemers/Extender: STONE III, HOYT Weeks in Treatment: 42 Active Problems Location of Pain Severity and Description of Pain Patient Has Paino No Site Locations Pain Management and Medication Current Pain Management: Electronic Signature(s) Signed: 03/22/2018 5:05:35 PM By: Arnette Norris Entered By: Arnette Norris on 03/18/2018 10:35:45 [name] ([id number]) -------------------------------------------------------------------------------- Patient/Caregiver Education Details Patient Name: [name]. Date of Service: 03/18/2018 10:00 AM Medical Record Number: [id number] Patient Account Number: [id number] Date of Birth/Gender: 05-11-1945 (72 y.o. F) Treating RN: Curtis Sites Primary Care Physician: Annita Brod Other Clinician: Referring Physician: Annita Brod Treating Physician/Extender: Skeet Simmer in Treatment: 26 Education Assessment Education Provided To: Patient and Caregiver Education Topics Provided Wound/Skin Impairment: Handouts: Other: wound care as ordered Methods: Demonstration, Explain/Verbal Responses: State content correctly Electronic Signature(s) Signed: 03/18/2018 3:45:44 PM By: Curtis Sites Entered By: Curtis Sites on 03/18/2018 11:31:08 [name] ([id number]) -------------------------------------------------------------------------------- Wound Assessment Details Patient Name: [name]. Date of Service: 03/18/2018 10:00 AM Medical Record Number: [id number] Patient Account Number: [id number] Date of Birth/Sex: Jun 15, 1945 (72 y.o. F) Treating RN: Arnette Norris Primary Care Jackson Coffield: Annita Brod Other Clinician: Referring Cabella Kimm: Annita Brod Treating Keeon Zurn/Extender: STONE III, HOYT Weeks in Treatment: 42 Wound Status Wound Number: 1  Primary Etiology: Pressure Ulcer Wound Location: Sacrum - Medial Wound Status: Open Wounding Event: Pressure Injury Date Acquired:  01/15/2017 Weeks Of Treatment: 42 Clustered Wound: No Photos Photo Uploaded By: Elliot Gurney, BSN, RN, CWS, Kim on 03/18/2018 17:21:40 Wound Measurements Length: (cm) 6.8 Width: (cm) 2.8 Depth: (cm) 2.7 Area: (cm) 14.954 Volume: (cm) 40.376 % Reduction in Area: 59.4% % Reduction in Volume: -9.6% Epithelialization: None Tunneling: Yes Position (o'clock): 11 Maximum Distance: (cm) 5.1 Undermining: No Wound Description Classification: Category/Stage IV Foul Odor Wound Margin: Distinct, outline attached Due to Pr Exudate Amount: Large Slough/Fi Exudate Type: Serosanguineous Exudate Color: red, brown After Cleansing: Yes oduct Use: No brino Yes Wound Bed Granulation Amount: Medium (34-66%) Exposed Structure Granulation Quality: Red, Pink, Hyper-granulation Fascia Exposed: Yes Necrotic Amount: Medium (34-66%) Fat Layer (Subcutaneous Tissue) Exposed: Yes Necrotic Quality: Eschar, Adherent Slough Tendon Exposed: No Muscle Exposed: Yes Necrosis of Muscle: No MARICEL, SWARTZENDRUBER. ([id number]) Joint Exposed: No Bone Exposed: No Periwound Skin Texture Texture Color No Abnormalities Noted: No No Abnormalities Noted: No Callus: No Atrophie Blanche: No Crepitus: No Cyanosis: No Excoriation: No Ecchymosis: No Induration: Yes Erythema: Yes Rash: No Erythema Location: Circumferential Scarring: Yes Hemosiderin Staining: No Mottled: No Moisture Pallor: No No Abnormalities Noted: No Rubor: Yes Dry / Scaly: No Maceration: No Temperature / Pain Temperature: No Abnormality Tenderness on Palpation: Yes Wound Preparation Ulcer Cleansing: Rinsed/Irrigated with Saline Topical Anesthetic Applied: Other: lidocaine 4%, Electronic Signature(s) Signed: 03/18/2018 3:45:44 PM By: Curtis Sites Signed: 03/22/2018 5:05:35 PM By: Arnette Norris Entered By: Curtis Sites on 03/18/2018 11:22:07 [name]  ([id number]) -------------------------------------------------------------------------------- Vitals Details Patient Name: [name]. Date of Service: 03/18/2018 10:00 AM Medical Record Number: [id number] Patient Account Number: [id number] Date of Birth/Sex: 1946-02-09 (72 y.o. F) Treating RN: Arnette Norris Primary Care Mathieu Schloemer: Annita Brod Other Clinician: Referring Trequan Marsolek: Annita Brod Treating Jamea Robicheaux/Extender: STONE III, HOYT Weeks in Treatment: 42 Vital Signs Time Taken: 10:35 Temperature (F): 98.1 Height (in): 70 Pulse (bpm): 79 Respiratory Rate (breaths/min): 18 Blood Pressure (mmHg): 143/58 Reference Range: 80 - 120 mg / dl Electronic Signature(s) Signed: 03/22/2018 5:05:35 PM By: Arnette Norris Entered By: Arnette Norris on 03/18/2018 10:36:15

## 2018-04-01 ENCOUNTER — Encounter: Payer: Medicare Other | Admitting: Physician Assistant

## 2018-04-01 DIAGNOSIS — L89154 Pressure ulcer of sacral region, stage 4: Secondary | ICD-10-CM | POA: Diagnosis not present

## 2018-04-03 NOTE — Progress Notes (Signed)
Jessica [name] ([id number]) Visit Report for 04/01/2018 Chief Complaint Document Details [name]. Date of Service: 04/01/2018 11:00 AM Medical Record Number: [id number] Patient Account Number: [id number] Date of Birth/Sex: May 27, 1945 (72 y.o. F) Treating RN: Curtis Sites Primary Care Provider: Annita Brod Other Clinician: Referring Provider: Annita Brod Treating Provider/Extender: Linwood Dibbles, HOYT Weeks in Treatment: 64 Information Obtained from: Patient Chief Complaint she is here for evaluation of a sacral ulcer and bilateral lower extremity ulcers Electronic Signature(s) Signed: 04/02/2018 12:35:32 AM By: Lenda Kelp PA-C Entered By: Lenda Kelp on 04/01/2018 11:15:51 Jessica [name] ([id number]) -------------------------------------------------------------------------------- HPI Details [name]. Date of Service: 04/01/2018 11:00 AM Medical Record Number: [id number] Patient Account Number: [id number] Date of Birth/Sex: 04-May-1945 (72 y.o. F) Treating RN: Curtis Sites Primary Care Provider: Annita Brod Other Clinician: Referring Provider: Annita Brod Treating Provider/Extender: Linwood Dibbles, HOYT Weeks in Treatment: 44 History of Present Illness HPI Description: 05/27/17-she is here in initial evaluation for a left-sided sacral stage IV pressure ulcer and bilateral lower extremity, lateral aspect, unstageable pressure ulcers. She is accompanied by her husband and her son, who are her primary caregivers. She is bedbound secondary to spinal stenosis. According to her son and husband she was hospitalized from 10/5-11/1 with healthcare associated pneumonia and altered mental status. During her hospitalization she was intubated, extubated on 10/27. She was discharged with Foley catheter and follow up with urology. According to her son and spouse she developed the sacral ulcer during hospitalization. Home health  has been applying Santyl daily. She does have a low air loss mattress and is repositioned every 2-3 hours per her family's report. According to the son and spouse she had an appointment with urology on 12/18 and during that appointment developed discoloration to her bilateral lower extremities which ultimately developed into unstageable pressure ulcers to the lateral aspects of her bilateral lower extremities. There has been no topical treatment applied to these. She continues to have home health. There is no concerns expressed regarding dietary intake, stating she eats 3 meals a day, eating was provided; she is supplemented with boost with protein. 06/03/17-she is here in follow-up evaluation for sacral and bilateral lower extremity ulcers. Plain film x-ray done today reveals no distraction to the sacrum or coccyx, no visible abnormalities. Home health has ordered the negative pressure wound system but it has not been initiated. We will continue with Hydrofera Blue until initiation of negative pressure wound system and continue with Santyl to bilateral lower extremity ulcers. Follow-up next week 06/10/17-she is here in follow-up evaluation for sacral and bilateral lower extremity ulcers. The wound VAC will be available tomorrow per home health. We will initiate wound VAC therapy to the sacral ulcer 3 times weekly (Thursday, Saturday/Sunday, Tuesday). We will continue with Santyl to the lower extremity ulcers. The patient's son is checking into home health therapy over the weekend for [facility] changes, with the understanding that if VAC changes cannot be performed over the weekend he will need to change his mother's appointments to Monday, Wednesday or Friday. The sacral ulcer clinically does not appear infected but there has been a change in the amount of drainage acutely, there is no significant amount of devitalized tissue, there is no malodor. Wound culture was obtained to evaluate for occult infection  we will hold off on antibiotic therapy until sensitivities are resulted. 06/18/17 on evaluation today patient appears to be doing fairly well in my opinion although this is the first time I  have seen this patient she has been previously evaluated by Tacey RuizLeah here in our office. She is going to be switching to Fridays to see me due to the Wound VAC schedule being changed on Monday, Wednesday, and Friday. Subsequently she seems to be doing fairly well with the Wound VAC. Her son who was present during evaluation today states that he somewhat stresses of the Wound VAC in making sure that it was functioning appropriately. With that being said everything seems to be working well he knows what settings on the [facility] HVAC itself to look at and ensure that it is functioning properly. Overall the wound appears to be nice and clean there is no need for debridement today. She has no discomfort in her bilateral lower extremity ulcers also appear to be improving based on measurements and what this honest tell me about the overall appearance. 07/02/17 on evaluation today I noted in patients wound bed that was actually an odor that had not previously been noted. Subsequently there was also a small area of bone that was not previously noted during my last evaluation. This did appear to be necrotic and was being somewhat forced out by the body around the region of granulation. She does not have any pain which is good news. Nonetheless the overall appearance of the ulcer is making me concerned for the patient having developed osteomyelitis. Currently she is not been on any antibiotics and the Wound VAC has been doing fairly well in general. With that being said I do not think we need to continue the Wound VAC if she potentially has a bone infection. At least not until it is properly addressed with antibiotics. 07/09/17 on evaluation today patient appears to actually be doing very well in regard to her sacral ulcer compared to last  week. There is actually no exposed bone at this point. 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 With that being said still I do believe she needs to have a referral to infectious disease due to the osteomyelitis I am gonna recommend that she continue with the doxycycline based on the culture results which showed Eikenella Corrodens as the Jessica BradyOVERMAN, [name] ([id number]) organism identified that tetracycline should work for. She does not seem to be having any discomfort whatsoever at this point. 07/16/17 on evaluation today patient actually appears to be doing rather well in regard to her sacral wound. I think that the original wound site actually appears much better than previously noted. With that being said she does have a new superficial injury on the right sacral region which appears to be due to according to the sign transport that occurred for her MRI unfortunately. Fortunately this is not too deep and I do think it can be managed but it was a new area that was not previously noted. Otherwise she has been tolerating the Dakinos soaked gauze packing without complication. 07/30/17 on evaluation today patient presents for reevaluation concerning her sacral wound. She has been tolerating the dressing changes without complication. With that being said her wound is doing so well at this point that I think she may be at the point where we could reinitiate the Wound VAC currently and hopefully see good results from this. I'm very pleased with how she has responded to the Dakinos soaked gauze packing. 08/13/17 evaluation today patient appears to be doing excellent in  regard to her lower extremity ulcer this seems to be cleaning up very nicely. In regard to the sacral ulcer the area of trauma on the right lateral portion of the wound actually appears  to be much better than previously noted during the last evaluation. The word has definitely filled in and the Wound VAC appears to be helpful I do believe. Overall I'm very pleased with how things seem to be progressing at this time. Patient likewise is also happy that things are doing well. 08/27/17 on evaluation today patient presents for follow-up concerning her ongoing issues with her sacral ulcer and lower extremity ulcer. Fortunately she has been tolerating the dressing changes well complication the Wound VAC in general seems to have done very well up to this point. She does not have any evidence of infection which is good news. She does have some dark discoloration in central portion of the wound which is troublesome for the possibility of pressure injury to the site it sounds like her prior air mattress was not functioning properly her son has just bought a new one for her this is doing much better. Otherwise things seem to be progressing nicely. 09/10/17 on evaluation today patient presents for follow-up concerning her sacral wound and lower extremity ulcer. She has been tolerating the switch of her dressing changes to the Dakinos soaked gauze dressing very well in regard to the sacrum. The left lateral lower extremity ulcer appears to have a new area open just distal to the one that we have been treating with Santyl and this is new since her last visit. There does not appear to be any evidence of infection which is good news. With that being said there's a lot of maceration here at the site and I feel this is likely due to the switch and the dressings it appears that due to the sticking of the dressings it will switch to utilizing a telfa pad over the Santyl. I think this caused a lot of drainage to be collected and situated in the region just under the dressing which has led to this causing which duration of breakdown. Overall there does not appear to be any significant pain which is good  news. Of note I did actually have a conversation with the radiologist who is an interventional radiologist with [facility]. 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 With that being said there does not appear to be any rush to get this done according to the interventional radiologist whom I spoke with. Therefore after discussion it was determined that there gonna wait about six months prior to considering the procedure to get time for hopefully the sacral wound to heal in the interim to at least some degree. 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 According to nursing staff these are all improved, the negative pressure wound therapy system was put on hold last week. We will resume negative pressure wound therapy today, continue with Santyl to the lower extremity and she will follow-up next week. 09/24/17 on evaluation today unfortunately the patient's wound appears to be doing significantly worse compared to last week's evaluation and even my valuation the week before. She actually has bone noted on the left wound margin in the region of undermining unfortunately. This was not noted during the last evaluation. I am concerned about the fact that this seems to be worse not better since  our last visit with her. My biggest concern is that she's likely developing a worsening osteomyelitis. This was discussed with patient and her son today during the office visit. 05/03/17 on evaluation today patient actually appears to be doing rather well in regard to her sacral ulcer compared to last week's evaluation. She has been tolerating the dressing changes without complication. Fortunately even though we're not utilizing the Wound VAC she seems to be making some strides in seeing the wound area overall improved quite dramatically in my pinion in just one weeks time. She also seems to  be staying off of this to the point that I do not see any evidence of new injury which is excellent news. Whatever she's been doing in that regard over the past week I would want her to continue. I did also review the results of her bone culture which revealed group B strep as the responsible organism. Again this is what's causing her osteomyelitis she does have infectious disease appointment scheduled for 10/11/17 10/08/1898 valuation today patient appears to be doing better for the most part in regard to the sacral wound. Overall she is showing signs of having granulation of the bone which is good news. There is an area towards the left of the wound where she does seem to be showing some signs of opening this it would be due to additional pressure to the area unfortunately. Jessica, [name]. ([id number]) There does not appear to be any evidence of infection spreading which is good news that do not see any evidence of significant purulent discharge which is also good news. 10/26/17 on evaluation today patient sacral ulcer actually appears to be very healthy and doing well in regard to the overall appearance of the wound. With that being said she does seem to be having some issues at this point in time with some shear/friction injury of the sacrum from where she was transported to [facility] for her infectious disease appointment. She has been placed on IV antibiotic therapy I will have to go back and find that note for review as I did not have access to that today. Nonetheless I will add the next visit be sure to document the exact antibiotics that she is taking at this point. Nonetheless I do believe the wound is doing better there's no bone exposure nothing that requires debridement in regard to the sacral wound. Likewise her left lateral lower extremity ulcer also appears to be doing significantly better. In general I feel like things are showing signs of improvement all around. 11/12/17 on  evaluation today patient appears to be doing rather well in regard to her sacral wound. In general I do see signs of granulation at this point. Fortunately there does not appear to be any evidence of infection which is good news as well. In general overall happy with the appearance. With that being said I'll be see I would like for this to be progressing more rapidly as with the patient and her son but nonetheless at least things do not appear to be doing worse. Her lower extremity ulcer is doing significantly better. 11/26/17 on evaluation today patient appears to be doing a little better in regard to the sacral wound especially in the peripheral of the wound bed. She actually did have an abrasion on the right gluteal region that's completely resolved to this point. Her lower extremity also appears to be for the most part completely resolved. Overall the sacrum itself seems to be the one thing that is  still open and given her trouble. No fevers, chills, nausea, or vomiting noted at this time. She actually has an appointment next week with infectious disease for recheck to see how things are doing. She maybe switch to oral medications at that point. 12/09/17 on evaluation today patient actually appears to be doing much better in regard to her sacral wound. I do feel like she has made good progress at this time as far as healing is concerned. In fact the wound looks better to me today than it has in quite some time. She is on oral antibiotic therapy and no longer on the IV antibiotics. She did see infectious disease last week. 12/24/17 on evaluation today patient's wounds actually appear to be doing decently well in regard to the sacral wound in particular. When it comes to her lower extremity ulcers both appear to be completely healed at this point in the one area where she had lived the rash has completely resolved at this time. Overall very pleased with the progress that has been made. Nonetheless  the patient seems to be doing well in general. She still does have quite a bit of healing to do in regard to the sacral wound but I feel like she is making progress. 01/07/18 on evaluation today patient actually appears to be doing excellent today regard to her sacral wound. The granular quality in the base of the wound is excellent and shown signs of good improvement there's no evidence of contusion nor deep tissue injury and the periwound actually appears to be doing well. In general I'm very pleased with the overall appearance. 02/04/18 on evaluation today patient's wound actually appears to be doing decently well as far as the granulation is concerned. She has been tolerating the dressing changes without complication. Right now we are still using the Wound VAC. Nonetheless overall there apparently is some cater to the wound and this is concerning simply for the fact that she could be developing a soft tissue infection again. She's had this happen in the past and at that time responded very well to the antibiotics. Nonetheless I don't think I would likely put her on anything prophylactically but rather wait for the results of the culture to return. 02/18/18 on evaluation today patient actually appears to be doing fairly well. She has been tolerating the dressing changes. And the Wound VAC seems to be doing well. Overall I'm very pleased with how things have improved over the past couple weeks since I last saw her. Fortunately there does not appear to be any evidence of overt infection at this time which is good news. She has been taking the antibiotics without complication. Specifically that is the Bactrim DS which was prescribed on 02/08/18. 03/18/18 on evaluation today patient's wound actually does have quite a bit of odor noted compared to previous. With that being said the wound itself overall does not appear to be too significant or worse. There is no event noted in the wound bed which is good  news. With that being said the patient has been worried as the home health nurse stated that she thought she saw bone in the base of the wound. Nonetheless there's a little bit of bruising along the left lateral border of the wound bed which again does not appear to be terrible we have noticed this before but other than this the majority the wound bed appears to be doing excellent in my pinion. I am wondering if there may be some bacterial colonization. This  could be leading to some of the odor that we are noting with the Wound VAC. Jessica, [name] ([id number]) 04/01/18 on evaluation today patient appears to actually be doing well in regard to the overall appearance of the wound itself. She also does not have any odor at this point in regard to the wound surface and I do believe the Bacons is extremely helpful in this regard. With that being said she does have a little bit of deep tissue injury on the medial aspect of the wound. Again last time it was more lateral. The lateral seems to have improved quite well. Nonetheless she also notes by way of her son that this has been very macerated in the past several weeks since I last saw her. Though things overall seem to be doing better I still think that the Wound VAC may help with managing her fluid better I'm wondering if we can attempt a gauze Wound VAC my hope is that this will be able to manage the moisture control as well as continuing with managing the odor of the wound and bacterial load since the Dakinos has done excellent in helping in that regard. Electronic Signature(s) Signed: 04/02/2018 12:35:32 AM By: Lenda Kelp PA-C Entered By: Lenda Kelp on 04/01/2018 11:43:29 Jessica [name] ([id number]) -------------------------------------------------------------------------------- Physical Exam Details [name]. Date of Service: 04/01/2018 11:00 AM Medical Record Number: [id number] Patient Account Number:  [id number] Date of Birth/Sex: [birth year] (73 y.o. F) Treating RN: Curtis Sites Primary Care Provider: Annita Brod Other Clinician: Referring Provider: Annita Brod Treating Provider/Extender: STONE III, HOYT Weeks in Treatment: 44 Constitutional patient is hypertensive.. Well-nourished and well-hydrated in no acute distress. Respiratory normal breathing without difficulty. clear to auscultation bilaterally. Cardiovascular regular rate and rhythm with normal S1, S2. Psychiatric this patient is able to make decisions and demonstrates good insight into disease process. Alert and Oriented x 3. pleasant and cooperative. Notes Patient's wound bed currently shows evidence of good granulation at this time again there is some deep tissue injury on the medial aspect of the wound opening which has been more concerned in that regard. Nonetheless I think this is likely more pressure related possibly friction related and this was discussed with the patient and her son today. Nonetheless this continues to be an issue that I see intermittently. I have discussed this with them multiple times patient son insist and I'm inclined to believe him that they are very diligent about keeping her offloaded. Nonetheless I still believe she may be getting some pressure/friction to this region. They have a Hoyer lift but really do not use it regularly though they completely lift when moving or sliding according to the patient son. Electronic Signature(s) Signed: 04/02/2018 12:35:32 AM By: Lenda Kelp PA-C Entered By: Lenda Kelp on 04/01/2018 11:44:31 Jessica [name] ([id number]) -------------------------------------------------------------------------------- Physician Orders Details [name]. Date of Service: 04/01/2018 11:00 AM Medical Record Number: [id number] Patient Account Number: [id number] Date of Birth/Sex: [birth year] (72 y.o. F) Treating RN: Curtis Sites Primary  Care Provider: Annita Brod Other Clinician: Referring Provider: Annita Brod Treating Provider/Extender: Linwood Dibbles, HOYT Weeks in Treatment: 44 Verbal / Phone Orders: No Diagnosis Coding ICD-10 Coding Code Description L89.154 Pressure ulcer of sacral region, stage 4 M48.00 Spinal stenosis, site unspecified R54 Age-related physical debility G89.4 Chronic pain syndrome E78.2 Mixed hyperlipidemia L89.93 Pressure ulcer of unspecified site, stage 3 Wound Cleansing Wound #1 Medial Sacrum o Other: - please cleanse sacral wound with dakins  moistened gauze - do not spray dakins on wound Anesthetic (add to Medication List) Wound #1 Medial Sacrum o Topical Lidocaine 4% cream applied to wound bed prior to debridement (In Clinic Only). Skin Barriers/Peri-Wound Care Wound #1 Medial Sacrum o Skin Prep Primary Wound Dressing Wound #1 Medial Sacrum o Pack wound with: - Dakin's soaked gauze in clinic Secondary Dressing Wound #1 Medial Sacrum o ABD pad - secure with tape use until wound vac is placed back on wound Dressing Change Frequency Wound #1 Medial Sacrum o Change Dressing Monday, Wednesday, Friday Follow-up Appointments Wound #1 Medial Sacrum o Return Appointment in 2 weeks. Off-Loading Wound #1 Medial Sacrum Jessica, [name]. ([id number]) o Turn and reposition every 2 hours Additional Orders / Instructions Wound #1 Medial Sacrum o Increase protein intake. Home Health Wound #1 Medial Sacrum o Continue Home Health Visits - Advanced HHRN to start wound vac o Home Health Nurse may visit PRN to address patientos wound care needs. o FACE TO FACE ENCOUNTER: MEDICARE and MEDICAID PATIENTS: I certify that this patient is under my care and that I had a face-to-face encounter that meets the physician face-to-face encounter requirements with this patient on this date. The encounter with the patient was in whole or in part for the following MEDICAL CONDITION:  (primary reason for Home Healthcare) MEDICAL NECESSITY: I certify, that based on my findings, NURSING services are a medically necessary home health service. HOME BOUND STATUS: I certify that my clinical findings support that this patient is homebound (i.e., Due to illness or injury, pt requires aid of supportive devices such as crutches, cane, wheelchairs, walkers, the use of special transportation or the assistance of another person to leave their place of residence. There is a normal inability to leave the home and doing so requires considerable and taxing effort. Other absences are for medical reasons / religious services and are infrequent or of short duration when for other reasons). o If current dressing causes regression in wound condition, may D/C ordered dressing product/s and apply Normal Saline Moist Dressing daily until next [facility] / Other MD appointment. Notify [facility]aling Center of regression in wound condition at [phone]. o Please direct any NON-WOUND related issues/requests for orders to patient's Primary Care Physician Negative Pressure Wound Therapy Wound #1 Medial Sacrum o Wound VAC settings at 125/130 mmHg continuous pressure. Use BLACK/GREEN foam to wound cavity. Use WHITE foam to fill any tunnel/s and/or undermining. Change VAC dressing 3 X WEEK. Change canister as indicated when full. Nurse may titrate settings and frequency of dressing changes as clinically indicated. - PLEASE INITIATE GAUZE NPWTANTIMICROBIAL GAUZE - CONTINUE DAKINS MOISTENED GAUZE AND ABD/XTRASORB DRESSING UNTIL ANTIMICROBIAL GAUZE IS AVAILABLE FOR USE WITH NPWT HHRN to change on Mondays, Wednesdays and Fridays o Home Health Nurse may d/c VAC for s/s of increased infection, significant wound regression, or uncontrolled drainage. Notify W[facility] Healing Center at [phone]. o Number of foam/gauze pieces used in the dressing = Electronic Signature(s) Signed: 04/01/2018  5:12:41 PM By: Curtis Sites Signed: 04/02/2018 12:35:32 AM By: Lenda Kelp PA-C Entered By: Curtis Sites on 04/01/2018 17:08:18 Jessica [name] ([id number]) -------------------------------------------------------------------------------- Problem List Details [name]. Date of Service: 04/01/2018 11:00 AM Medical Record Number: [id number] Patient Account Number: [id number] Date of Birth/Sex: [birth year] (72 y.o. F) Treating RN: Curtis Sites Primary Care Provider: Annita Brod Other Clinician: Referring Provider: Annita Brod Treating Provider/Extender: Linwood Dibbles, HOYT Weeks in Treatment: 44 Active Problems ICD-10 Evaluated Encounter Code Description Active Date  Today Diagnosis L89.154 Pressure ulcer of sacral region, stage 4 05/27/2017 No Yes M48.00 Spinal stenosis, site unspecified 05/27/2017 No Yes R54 Age-related physical debility 05/27/2017 No Yes G89.4 Chronic pain syndrome 05/27/2017 No Yes E78.2 Mixed hyperlipidemia 05/27/2017 No Yes L89.93 Pressure ulcer of unspecified site, stage 3 05/27/2017 No Yes Inactive Problems Resolved Problems Electronic Signature(s) Signed: 04/02/2018 12:35:32 AM By: Lenda Kelp PA-C Entered By: Lenda Kelp on 04/01/2018 11:15:46 Jessica [name] ([id number]) -------------------------------------------------------------------------------- Progress Note Details [name]. Date of Service: 04/01/2018 11:00 AM Medical Record Number: [id number] Patient Account Number: [id number] Date of Birth/Sex: [birth year] (72 y.o. F) Treating RN: Curtis Sites Primary Care Provider: Annita Brod Other Clinician: Referring Provider: Annita Brod Treating Provider/Extender: Linwood Dibbles, HOYT Weeks in Treatment: 44 Subjective Chief Complaint Information obtained from Patient she is here for evaluation of a sacral ulcer and bilateral lower extremity ulcers History of Present Illness  (HPI) 05/27/17-she is here in initial evaluation for a left-sided sacral stage IV pressure ulcer and bilateral lower extremity, lateral aspect, unstageable pressure ulcers. She is accompanied by her husband and her son, who are her primary caregivers. She is bedbound secondary to spinal stenosis. According to her son and husband she was hospitalized from 10/5-11/1 with healthcare associated pneumonia and altered mental status. During her hospitalization she was intubated, extubated on 10/27. She was discharged with Foley catheter and follow up with urology. According to her son and spouse she developed the sacral ulcer during hospitalization. Home health has been applying Santyl daily. She does have a low air loss mattress and is repositioned every 2-3 hours per her family's report. According to the son and spouse she had an appointment with urology on 12/18 and during that appointment developed discoloration to her bilateral lower extremities which ultimately developed into unstageable pressure ulcers to the lateral aspects of her bilateral lower extremities. There has been no topical treatment applied to these. She continues to have home health. There is no concerns expressed regarding dietary intake, stating she eats 3 meals a day, eating was provided; she is supplemented with boost with protein. 06/03/17-she is here in follow-up evaluation for sacral and bilateral lower extremity ulcers. Plain film x-ray done today reveals no distraction to the sacrum or coccyx, no visible abnormalities. Home health has ordered the negative pressure wound system but it has not been initiated. We will continue with Hydrofera Blue until initiation of negative pressure wound system and continue with Santyl to bilateral lower extremity ulcers. Follow-up next week 06/10/17-she is here in follow-up evaluation for sacral and bilateral lower extremity ulcers. The wound VAC will be available tomorrow per home health. We  will initiate wound VAC therapy to the sacral ulcer 3 times weekly (Thursday, Saturday/Sunday, Tuesday). We will continue with Santyl to the lower extremity ulcers. The patient's son is checking into home health therapy over the weekend for [facility] changes, with the understanding that if VAC changes cannot be performed over the weekend he will need to change his mother's appointments to Monday, Wednesday or Friday. The sacral ulcer clinically does not appear infected but there has been a change in the amount of drainage acutely, there is no significant amount of devitalized tissue, there is no malodor. Wound culture was obtained to evaluate for occult infection we will hold off on antibiotic therapy until sensitivities are resulted. 06/18/17 on evaluation today patient appears to be doing fairly well in my opinion although this is the first time I have seen this patient she  has been previously evaluated by Tacey Ruiz here in our office. She is going to be switching to Fridays to see me due to the Wound VAC schedule being changed on Monday, Wednesday, and Friday. Subsequently she seems to be doing fairly well with the Wound VAC. Her son who was present during evaluation today states that he somewhat stresses of the Wound VAC in making sure that it was functioning appropriately. With that being said everything seems to be working well he knows what settings on the [facility] itself to look at and ensure that it is functioning properly. Overall the wound appears to be nice and clean there is no need for debridement today. She has no discomfort in her bilateral lower extremity ulcers also appear to be improving based on measurements and what this honest tell me about the overall appearance. 07/02/17 on evaluation today I noted in patients wound bed that was actually an odor that had not previously been noted. Subsequently there was also a small area of bone that was not previously noted during my last evaluation. This did  appear to be necrotic and was being somewhat forced out by the body around the region of granulation. She does not have any pain which is good news. Nonetheless the overall appearance of the ulcer is making me concerned for the patient having developed osteomyelitis. Currently she is not been on any antibiotics and the Wound VAC has been doing fairly well in general. With that being said I do not think we need to continue the Wound VAC if she potentially has a bone infection. At least not until it is properly addressed with antibiotics. Jessica, [name] ([id number]) 07/09/17 on evaluation today patient appears to actually be doing very well in regard to her sacral ulcer compared to last week. There is actually no exposed bone at this point. 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 With that being said still I do believe she needs to have a referral to infectious disease due to the osteomyelitis I am gonna recommend that she continue with the doxycycline based on the culture results which showed Eikenella Corrodens as the organism identified that tetracycline should work for. She does not seem to be having any discomfort whatsoever at this point. 07/16/17 on evaluation today patient actually appears to be doing rather well in regard to her sacral wound. I think that the original wound site actually appears much better than previously noted. With that being said she does have a new superficial injury on the right sacral region which appears to be due to according to the sign transport that occurred for her MRI unfortunately. Fortunately this is not too deep and I do think it can be managed but it was a new area that was not previously noted. Otherwise she has been tolerating the Dakin s soaked gauze packing without complication. 07/30/17 on  evaluation today patient presents for reevaluation concerning her sacral wound. She has been tolerating the dressing changes without complication. With that being said her wound is doing so well at this point that I think she may be at the point where we could reinitiate the Wound VAC currently and hopefully see good results from this. I'm very pleased with how she has responded to the [facility] s soaked gauze packing. 08/13/17 evaluation today patient appears to be doing excellent in regard to  her lower extremity ulcer this seems to be cleaning up very nicely. In regard to the sacral ulcer the area of trauma on the right lateral portion of the wound actually appears to be much better than previously noted during the last evaluation. The word has definitely filled in and the Wound VAC appears to be helpful I do believe. Overall I'm very pleased with how things seem to be progressing at this time. Patient likewise is also happy that things are doing well. 08/27/17 on evaluation today patient presents for follow-up concerning her ongoing issues with her sacral ulcer and lower extremity ulcer. Fortunately she has been tolerating the dressing changes well complication the Wound VAC in general seems to have done very well up to this point. She does not have any evidence of infection which is good news. She does have some dark discoloration in central portion of the wound which is troublesome for the possibility of pressure injury to the site it sounds like her prior air mattress was not functioning properly her son has just bought a new one for her this is doing much better. Otherwise things seem to be progressing nicely. 09/10/17 on evaluation today patient presents for follow-up concerning her sacral wound and lower extremity ulcer. She has been tolerating the switch of her dressing changes to the Dakin s soaked gauze dressing very well in regard to the sacrum. The left lateral lower extremity ulcer appears to  have a new area open just distal to the one that we have been treating with Santyl and this is new since her last visit. There does not appear to be any evidence of infection which is good news. With that being said there's a lot of maceration here at the site and I feel this is likely due to the switch and the dressings it appears that due to the sticking of the dressings it will switch to utilizing a telfa pad over the Santyl. I think this caused a lot of drainage to be collected and situated in the region just under the dressing which has led to this causing which duration of breakdown. Overall there does not appear to be any significant pain which is good news. Of note I did actually have a conversation with the radiologist who is an interventional radiologist with [facility]. 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 With that being said there does not appear to be any rush to get this done according to the interventional radiologist whom I spoke with. Therefore after discussion it was determined that there gonna wait about six months prior to considering the procedure to get time for hopefully the sacral wound to heal in the interim to at least some degree. 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 According to nursing staff these are all improved, the negative pressure wound therapy system was put on hold last week. We will resume negative pressure wound therapy today, continue with Santyl to the lower extremity and she will follow-up next week. 09/24/17 on evaluation today unfortunately the patient's wound appears to be doing significantly worse compared to last week's evaluation and even my valuation the week before. She actually has bone noted on the left wound margin in the region of undermining unfortunately. This was not noted during the last evaluation. I am  concerned about the fact that this seems to be worse not better since our  last visit with her. My biggest concern is that she's likely developing a worsening osteomyelitis. This was discussed with patient and her son today during the office visit. 05/03/17 on evaluation today patient actually appears to be doing rather well in regard to her sacral ulcer compared to last week's evaluation. She has been tolerating the dressing changes without complication. Fortunately even though we're not utilizing the Wound VAC she seems to be making some strides in seeing the wound area overall improved quite dramatically in my pinion in just one weeks time. She also seems to be staying off of this to the point that I do not see any evidence of new injury which is excellent news. Whatever she's been doing in that regard over the past week I would want her to continue. ENEZ, MONAHAN ([id number]) did also review the results of her bone culture which revealed group B strep as the responsible organism. Again this is what's causing her osteomyelitis she does have infectious disease appointment scheduled for 10/11/17 10/08/1898 valuation today patient appears to be doing better for the most part in regard to the sacral wound. Overall she is showing signs of having granulation of the bone which is good news. There is an area towards the left of the wound where she does seem to be showing some signs of opening this it would be due to additional pressure to the area unfortunately. There does not appear to be any evidence of infection spreading which is good news that do not see any evidence of significant purulent discharge which is also good news. 10/26/17 on evaluation today patient sacral ulcer actually appears to be very healthy and doing well in regard to the overall appearance of the wound. With that being said she does seem to be having some issues at this point in time with some shear/friction injury of the  sacrum from where she was transported to [facility] for her infectious disease appointment. She has been placed on IV antibiotic therapy I will have to go back and find that note for review as I did not have access to that today. Nonetheless I will add the next visit be sure to document the exact antibiotics that she is taking at this point. Nonetheless I do believe the wound is doing better there's no bone exposure nothing that requires debridement in regard to the sacral wound. Likewise her left lateral lower extremity ulcer also appears to be doing significantly better. In general I feel like things are showing signs of improvement all around. 11/12/17 on evaluation today patient appears to be doing rather well in regard to her sacral wound. In general I do see signs of granulation at this point. Fortunately there does not appear to be any evidence of infection which is good news as well. In general overall happy with the appearance. With that being said I'll be see I would like for this to be progressing more rapidly as with the patient and her son but nonetheless at least things do not appear to be doing worse. Her lower extremity ulcer is doing significantly better. 11/26/17 on evaluation today patient appears to be doing a little better in regard to the sacral wound especially in the peripheral of the wound bed. She actually did have an abrasion on the right gluteal region that's completely resolved to this point. Her lower extremity also appears to be for the most part completely resolved. Overall the sacrum itself seems to be the one thing that is still open  and given her trouble. No fevers, chills, nausea, or vomiting noted at this time. She actually has an appointment next week with infectious disease for recheck to see how things are doing. She maybe switch to oral medications at that point. 12/09/17 on evaluation today patient actually appears to be doing much better in regard to her sacral  wound. I do feel like she has made good progress at this time as far as healing is concerned. In fact the wound looks better to me today than it has in quite some time. She is on oral antibiotic therapy and no longer on the IV antibiotics. She did see infectious disease last week. 12/24/17 on evaluation today patient's wounds actually appear to be doing decently well in regard to the sacral wound in particular. When it comes to her lower extremity ulcers both appear to be completely healed at this point in the one area where she had lived the rash has completely resolved at this time. Overall very pleased with the progress that has been made. Nonetheless the patient seems to be doing well in general. She still does have quite a bit of healing to do in regard to the sacral wound but I feel like she is making progress. 01/07/18 on evaluation today patient actually appears to be doing excellent today regard to her sacral wound. The granular quality in the base of the wound is excellent and shown signs of good improvement there's no evidence of contusion nor deep tissue injury and the periwound actually appears to be doing well. In general I'm very pleased with the overall appearance. 02/04/18 on evaluation today patient's wound actually appears to be doing decently well as far as the granulation is concerned. She has been tolerating the dressing changes without complication. Right now we are still using the Wound VAC. Nonetheless overall there apparently is some cater to the wound and this is concerning simply for the fact that she could be developing a soft tissue infection again. She's had this happen in the past and at that time responded very well to the antibiotics. Nonetheless I don't think I would likely put her on anything prophylactically but rather wait for the results of the culture to return. 02/18/18 on evaluation today patient actually appears to be doing fairly well. She has been  tolerating the dressing changes. And the Wound VAC seems to be doing well. Overall I'm very pleased with how things have improved over the past couple weeks since I last saw her. Fortunately there does not appear to be any evidence of overt infection at this time which is good news. She has been taking the antibiotics without complication. Specifically that is the Bactrim DS which was prescribed on 02/08/18. 03/18/18 on evaluation today patient's wound actually does have quite a bit of odor noted compared to previous. With that Jessica BradyOVERMAN, Dayelin W. ([id number]) being said the wound itself overall does not appear to be too significant or worse. There is no event noted in the wound bed which is good news. With that being said the patient has been worried as the home health nurse stated that she thought she saw bone in the base of the wound. Nonetheless there's a little bit of bruising along the left lateral border of the wound bed which again does not appear to be terrible we have noticed this before but other than this the majority the wound bed appears to be doing excellent in my pinion. I am wondering if there may be some bacterial  colonization. This could be leading to some of the odor that we are noting with the Wound VAC. 04/01/18 on evaluation today patient appears to actually be doing well in regard to the overall appearance of the wound itself. She also does not have any odor at this point in regard to the wound surface and I do believe the Bacons is extremely helpful in this regard. With that being said she does have a little bit of deep tissue injury on the medial aspect of the wound. Again last time it was more lateral. The lateral seems to have improved quite well. Nonetheless she also notes by way of her son that this has been very macerated in the past several weeks since I last saw her. Though things overall seem to be doing better I still think that the Wound VAC may help with managing  her fluid better I'm wondering if we can attempt a gauze Wound VAC my hope is that this will be able to manage the moisture control as well as continuing with managing the odor of the wound and bacterial load since the Dakin s has done excellent in helping in that regard. Patient History Information obtained from Patient. Family History No family history of Cancer, Diabetes, Heart Disease. Social History Never smoker, Marital Status - Married. Review of Systems (ROS) Constitutional Symptoms (General Health) Denies complaints or symptoms of Fever, Chills. Respiratory The patient has no complaints or symptoms. Cardiovascular The patient has no complaints or symptoms. Psychiatric The patient has no complaints or symptoms. Objective Constitutional patient is hypertensive.. Well-nourished and well-hydrated in no acute distress. Vitals Time Taken: 11:00 AM, Height: 70 in, Temperature: 98.2 F, Pulse: 76 bpm, Respiratory Rate: 18 breaths/min, Blood Pressure: 147/70 mmHg. Respiratory normal breathing without difficulty. clear to auscultation bilaterally. Cardiovascular regular rate and rhythm with normal S1, S2. [name], [name] ([id number]) Psychiatric this patient is able to make decisions and demonstrates good insight into disease process. Alert and Oriented x 3. pleasant and cooperative. General Notes: Patient's wound bed currently shows evidence of good granulation at this time again there is some deep tissue injury on the medial aspect of the wound opening which has been more concerned in that regard. Nonetheless I think this is likely more pressure related possibly friction related and this was discussed with the patient and her son today. Nonetheless this continues to be an issue that I see intermittently. I have discussed this with them multiple times patient son insist and I'm inclined to believe him that they are very diligent about keeping her offloaded. Nonetheless I still  believe she may be getting some pressure/friction to this region. They have a Hoyer lift but really do not use it regularly though they completely lift when moving or sliding according to the patient son. Integumentary (Hair, Skin) Wound #1 status is Open. Original cause of wound was Pressure Injury. The wound is located on the Medial Sacrum. The wound measures 8cm length x 4cm width x 3.2cm depth; 25.133cm^2 area and 80.425cm^3 volume. There is muscle, Fat Layer (Subcutaneous Tissue) Exposed, and fascia exposed. Tunneling has been noted at 11:00 with a maximum distance of 3.2cm. Undermining begins at 7:00 and ends at 10:00 with a maximum distance of 3.2cm. There is a large amount of sanguinous drainage noted. Foul odor after cleansing was noted. The wound margin is distinct with the outline attached to the wound base. There is large (67-100%) red, pink, hyper - granulation within the wound bed. There is a small (1-33%)  amount of necrotic tissue within the wound bed including Eschar and Adherent Slough. The periwound skin appearance exhibited: Induration, Scarring, Maceration, Rubor, Erythema. The periwound skin appearance did not exhibit: Callus, Crepitus, Excoriation, Rash, Dry/Scaly, Atrophie Blanche, Cyanosis, Ecchymosis, Hemosiderin Staining, Mottled, Pallor. The surrounding wound skin color is noted with erythema which is circumferential. Periwound temperature was noted as No Abnormality. The periwound has tenderness on palpation. Assessment Active Problems ICD-10 Pressure ulcer of sacral region, stage 4 Spinal stenosis, site unspecified Age-related physical debility Chronic pain syndrome Mixed hyperlipidemia Pressure ulcer of unspecified site, stage 3 Plan Wound Cleansing: Wound #1 Medial Sacrum: Other: - please cleanse sacral wound with dakins moistened gauze - do not spray dakins on wound Anesthetic (add to Medication List): Wound #1 Medial Sacrum: Topical Lidocaine 4% cream  applied to wound bed prior to debridement (In Clinic Only). Skin Barriers/Peri-Wound Care: Wound #1 Medial Sacrum: Skin Prep Primary Wound Dressing: Jessica, [name] ([id number]) Wound #1 Medial Sacrum: Pack wound with: - Dakin's soaked gauze in clinic Secondary Dressing: Wound #1 Medial Sacrum: ABD pad - secure with tape use until wound vac is placed back on wound Dressing Change Frequency: Wound #1 Medial Sacrum: Change Dressing Monday, Wednesday, Friday Follow-up Appointments: Wound #1 Medial Sacrum: Return Appointment in 2 weeks. Off-Loading: Wound #1 Medial Sacrum: Turn and reposition every 2 hours Additional Orders / Instructions: Wound #1 Medial Sacrum: Increase protein intake. Home Health: Wound #1 Medial Sacrum: Continue Home Health Visits - Advanced HHRN to start wound vac Home Health Nurse may visit PRN to address patient s wound care needs. FACE TO FACE ENCOUNTER: MEDICARE and MEDICAID PATIENTS: I certify that this patient is under my care and that I had a face-to-face encounter that meets the physician face-to-face encounter requirements with this patient on this date. The encounter with the patient was in whole or in part for the following MEDICAL CONDITION: (primary reason for Home Healthcare) MEDICAL NECESSITY: I certify, that based on my findings, NURSING services are a medically necessary home health service. HOME BOUND STATUS: I certify that my clinical findings support that this patient is homebound (i.e., Due to illness or injury, pt requires aid of supportive devices such as crutches, cane, wheelchairs, walkers, the use of special transportation or the assistance of another person to leave their place of residence. There is a normal inability to leave the home and doing so requires considerable and taxing effort. Other absences are for medical reasons / religious services and are infrequent or of short duration when for other reasons). If current dressing  causes regression in wound condition, may D/C ordered dressing product/s and apply Normal Saline Moist Dressing daily until next Wound Healing Center / Other MD appointment. Notify Wound Healing Center of regression in wound condition at [phone]. Please direct any NON-WOUND related issues/requests for orders to patient's Primary Care Physician Negative Pressure Wound Therapy: Wound #1 Medial Sacrum: Wound VAC settings at 125/130 mmHg continuous pressure. Use BLACK/GREEN foam to wound cavity. Use WHITE foam to fill any tunnel/s and/or undermining. Change VAC dressing 3 X WEEK. Change canister as indicated when full. Nurse may titrate settings and frequency of dressing changes as clinically indicated. - PLEASE INITIATE GAUZE NPWT WITH DAKINS MOISTENED MEDELA GAUZE HHRN to change on Mondays, Wednesdays and Fridays Home Health Nurse may d/c VAC for s/s of increased infection, significant wound regression, or uncontrolled drainage. Notify Wound Healing Center at [phone]. Number of foam/gauze pieces used in the dressing = Upon inspection today I'm gonna suggest that  we continue with the above wound care measures. We are gonna see about initiating the gauze of Wound VAC to see if this will be of benefit we will continue to soak the gauze with Dakin solution. We will see were things stand in two weeks. Follow up to select all Please see above for specific wound care orders. We will see patient for re-evaluation in 2 week(s) here in the clinic. If anything worsens or changes patient will contact our office for additional recommendations. Electronic Signature(s) Signed: 04/02/2018 12:35:32 AM By: Lenda Kelp PA-C Entered By: Lenda Kelp on 04/01/2018 11:48:48 Jessica [name] ([id number] [name], Jessica [name] ([id number]) -------------------------------------------------------------------------------- ROS/PFSH Details [name]. Date of Service: 04/01/2018  11:00 AM Medical Record Number: [id number] Patient Account Number: [id number] Date of Birth/Sex: 1946-03-08 (72 y.o. F) Treating RN: Curtis Sites Primary Care Provider: Annita Brod Other Clinician: Referring Provider: Annita Brod Treating Provider/Extender: STONE III, HOYT Weeks in Treatment: 44 Information Obtained From Patient Wound History Constitutional Symptoms (General Health) Complaints and Symptoms: Negative for: Fever; Chills Respiratory Complaints and Symptoms: No Complaints or Symptoms Cardiovascular Complaints and Symptoms: No Complaints or Symptoms Psychiatric Complaints and Symptoms: No Complaints or Symptoms Immunizations Pneumococcal Vaccine: Received Pneumococcal Vaccination: No Implantable Devices Family and Social History Cancer: No; Diabetes: No; Heart Disease: No; Never smoker; Marital Status - Married Physician Affirmation I have reviewed and agree with the above information. Electronic Signature(s) Signed: 04/01/2018 5:12:41 PM By: Curtis Sites Signed: 04/02/2018 12:35:32 AM By: Lenda Kelp PA-C Entered By: Lenda Kelp on 04/01/2018 11:43:43 Jessica [name] ([id number]) -------------------------------------------------------------------------------- SuperBill Details [name]. Date of Service: 04/01/2018 Medical Record Number: [id number] Patient Account Number: [id number] Date of Birth/Sex: 14-Jul-1945 (72 y.o. F) Treating RN: Curtis Sites Primary Care Provider: Annita Brod Other Clinician: Referring Provider: Annita Brod Treating Provider/Extender: STONE III, HOYT Weeks in Treatment: 44 Diagnosis Coding ICD-10 Codes Code Description L89.154 Pressure ulcer of sacral region, stage 4 M48.00 Spinal stenosis, site unspecified R54 Age-related physical debility G89.4 Chronic pain syndrome E78.2 Mixed hyperlipidemia L89.93 Pressure ulcer of unspecified site, stage 3 Facility Procedures CPT4 Code:  [id number] Description: 99213 - WOUND CARE VISIT-LEV 3 EST PT Modifier: Quantity: 1 Physician Procedures CPT4 Code: [id number] Description: 99214 - WC PHYS LEVEL 4 - EST PT ICD-10 Diagnosis Description L89.154 Pressure ulcer of sacral region, stage 4 M48.00 Spinal stenosis, site unspecified R54 Age-related physical debility G89.4 Chronic pain syndrome Modifier: Quantity: 1 Electronic Signature(s) Signed: 04/01/2018 12:46:28 PM By: Curtis Sites Signed: 04/02/2018 12:35:32 AM By: Lenda Kelp PA-C Entered By: Curtis Sites on 04/01/2018 12:46:27

## 2018-04-06 NOTE — Progress Notes (Signed)
Jessica [name], [name] ([id number]) Visit Report for 04/01/2018 Arrival Information Details [name], [name] Date of Service: 04/01/2018 11:00 AM Medical Record Number: [id number] Patient Account Number: [id number] Date of Birth/Sex: 01-Apr-1946 (72 y.o. F) Treating RN: Arnette NorrisBiell, Kristina Primary Care Keyry Iracheta: Annita BrodASENSO, PHILIP Other Clinician: Referring Ellery Meroney: Annita BrodASENSO, PHILIP Treating Ashni Lonzo/Extender: Linwood DibblesSTONE III, HOYT Weeks in Treatment: 44 Visit Information History Since Last Visit Added or deleted any medications: No Patient Arrived: Stretcher Any new allergies or adverse reactions: No Arrival Time: 10:57 Had a fall or experienced change in No Accompanied By: son activities of daily living that may affect Transfer Assistance: Stretcher risk of falls: Patient Identification Verified: Yes Signs or symptoms of abuse/neglect since last visito No Secondary Verification Process Yes Hospitalized since last visit: No Completed: Pain Present Now: No Patient Has Alerts: Yes Patient Alerts: ALLERGIC TO ZINC Electronic Signature(s) Signed: 04/05/2018 1:37:43 PM By: Arnette NorrisBiell, Kristina Entered By: Arnette NorrisBiell, Kristina on 04/01/2018 10:58:27 Jessica [name], [name] ([id number]) -------------------------------------------------------------------------------- Clinic Level of Care Assessment Details [name], [name] Date of Service: 04/01/2018 11:00 AM Medical Record Number: [id number] Patient Account Number: [id number] Date of Birth/Sex: 01-Apr-1946 (72 y.o. F) Treating RN: Curtis Sitesorthy, Joanna Primary Care Tavius Turgeon: Annita BrodASENSO, PHILIP Other Clinician: Referring Selen Smucker: Annita BrodASENSO, PHILIP Treating Nashley Cordoba/Extender: Linwood DibblesSTONE III, HOYT Weeks in Treatment: 44 Clinic Level of Care Assessment Items TOOL 4 Quantity Score []  - Use when only an EandM is performed on FOLLOW-UP visit 0 ASSESSMENTS - Nursing Assessment / Reassessment X - Reassessment of Co-morbidities (includes updates in patient  status) 1 10 X- 1 5 Reassessment of Adherence to Treatment Plan ASSESSMENTS - Wound and Skin Assessment / Reassessment X - Simple Wound Assessment / Reassessment - one wound 1 5 []  - 0 Complex Wound Assessment / Reassessment - multiple wounds []  - 0 Dermatologic / Skin Assessment (not related to wound area) ASSESSMENTS - Focused Assessment []  - Circumferential Edema Measurements - multi extremities 0 []  - 0 Nutritional Assessment / Counseling / Intervention []  - 0 Lower Extremity Assessment (monofilament, tuning fork, pulses) []  - 0 Peripheral Arterial Disease Assessment (using hand held doppler) ASSESSMENTS - Ostomy and/or Continence Assessment and Care []  - Incontinence Assessment and Management 0 []  - 0 Ostomy Care Assessment and Management (repouching, etc.) PROCESS - Coordination of Care X - Simple Patient / Family Education for ongoing care 1 15 []  - 0 Complex (extensive) Patient / Family Education for ongoing care []  - 0 Staff obtains ChiropractorConsents, Records, Test Results / Process Orders []  - 0 Staff telephones HHA, Nursing Homes / Clarify orders / etc []  - 0 Routine Transfer to another Facility (non-emergent condition) []  - 0 Routine Hospital Admission (non-emergent condition) []  - 0 New Admissions / Manufacturing engineernsurance Authorizations / Ordering NPWT, Apligraf, etc. []  - 0 Emergency Hospital Admission (emergent condition) X- 1 10 Simple Discharge Coordination Jessica [name], [name] ([id number]) []  - 0 Complex (extensive) Discharge Coordination PROCESS - Special Needs []  - Pediatric / Minor Patient Management 0 []  - 0 Isolation Patient Management []  - 0 Hearing / Language / Visual special needs []  - 0 Assessment of Community assistance (transportation, D/C planning, etc.) []  - 0 Additional assistance / Altered mentation []  - 0 Support Surface(s) Assessment (bed, cushion, seat, etc.) INTERVENTIONS - Wound Cleansing / Measurement X - Simple Wound Cleansing - one wound 1 5 []   - 0 Complex Wound Cleansing - multiple wounds X- 1 5 Wound Imaging (photographs - any number of wounds) []  - 0 Wound Tracing (instead of photographs) X- 1 5  Simple Wound Measurement - one wound []  - 0 Complex Wound Measurement - multiple wounds INTERVENTIONS - Wound Dressings X - Small Wound Dressing one or multiple wounds 1 10 []  - 0 Medium Wound Dressing one or multiple wounds []  - 0 Large Wound Dressing one or multiple wounds X- 1 5 Application of Medications - topical []  - 0 Application of Medications - injection INTERVENTIONS - Miscellaneous []  - External ear exam 0 []  - 0 Specimen Collection (cultures, biopsies, blood, body fluids, etc.) []  - 0 Specimen(s) / Culture(s) sent or taken to Lab for analysis []  - 0 Patient Transfer (multiple staff / Nurse, adultHoyer Lift / Similar devices) []  - 0 Simple Staple / Suture removal (25 or less) []  - 0 Complex Staple / Suture removal (26 or more) []  - 0 Hypo / Hyperglycemic Management (close monitor of Blood Glucose) []  - 0 Ankle / Brachial Index (ABI) - do not check if billed separately X- 1 5 Vital Signs [name], [name] ([id number]) Has the patient been seen at the hospital within the last three years: Yes Total Score: 80 Level Of Care: New/Established - Level 3 Electronic Signature(s) Signed: 04/01/2018 5:12:41 PM By: Curtis Sitesorthy, Joanna Entered By: Curtis Sitesorthy, Joanna on 04/01/2018 12:46:19 Jessica [name], [name] ([id number]) -------------------------------------------------------------------------------- Encounter Discharge Information Details [name], [name] Date of Service: 04/01/2018 11:00 AM Medical Record Number: [id number] Patient Account Number: [id number] Date of Birth/Sex: July 16, 1945 (72 y.o. F) Treating RN: Curtis Sitesorthy, Joanna Primary Care Rella Egelston: Annita BrodASENSO, PHILIP Other Clinician: Referring Avelino Herren: Annita BrodASENSO, PHILIP Treating Byron Peacock/Extender: Linwood DibblesSTONE III, HOYT Weeks in Treatment: 8244 Encounter Discharge Information  Items Discharge Condition: Stable Ambulatory Status: Stretcher Discharge Destination: Home Transportation: Private Auto Accompanied By: son Schedule Follow-up Appointment: Yes Clinical Summary of Care: Electronic Signature(s) Signed: 04/01/2018 12:47:39 PM By: Curtis Sitesorthy, Joanna Entered By: Curtis Sitesorthy, Joanna on 04/01/2018 12:47:39 Jessica [name], Creola W. ([id number]) -------------------------------------------------------------------------------- Lower Extremity Assessment Details [name], Shalene W. Date of Service: 04/01/2018 11:00 AM Medical Record Number: [id number] Patient Account Number: [id number] Date of Birth/Sex: July 16, 1945 (72 y.o. F) Treating RN: Huel CoventryWoody, Kim Primary Care Skippy Marhefka: Annita BrodASENSO, PHILIP Other Clinician: Referring Juana Montini: Annita BrodASENSO, PHILIP Treating Ronie Fleeger/Extender: Lenda KelpSTONE III, HOYT Weeks in Treatment: 44 Electronic Signature(s) Signed: 04/01/2018 4:41:19 PM By: Elliot GurneyWoody, BSN, RN, CWS, Kim RN, BSN Entered By: Elliot GurneyWoody, BSN, RN, CWS, Kim on 04/01/2018 11:10:53 Jessica [name], [name] ([id number]) -------------------------------------------------------------------------------- Multi Wound Chart Details [name], [name] Date of Service: 04/01/2018 11:00 AM Medical Record Number: [id number] Patient Account Number: [id number] Date of Birth/Sex: July 16, 1945 (72 y.o. F) Treating RN: Curtis Sitesorthy, Joanna Primary Care Rise Traeger: Annita BrodASENSO, PHILIP Other Clinician: Referring Vernessa Likes: Annita BrodASENSO, PHILIP Treating Nithin Demeo/Extender: STONE III, HOYT Weeks in Treatment: 44 Vital Signs Height(in): 70 Pulse(bpm): 76 Weight(lbs): Blood Pressure(mmHg): 147/70 Body Mass Index(BMI): Temperature(F): 98.2 Respiratory Rate 18 (breaths/min): Photos: [1:No Photos] [N/A:N/A] Wound Location: [1:Sacrum - Medial] [N/A:N/A] Wounding Event: [1:Pressure Injury] [N/A:N/A] Primary Etiology: [1:Pressure Ulcer] [N/A:N/A] Date Acquired: [1:01/15/2017] [N/A:N/A] Weeks of Treatment: [1:44]  [N/A:N/A] Wound Status: [1:Open] [N/A:N/A] Measurements L x W x D [1:8x4x3.2] [N/A:N/A] (cm) Area (cm) : [1:25.133] [N/A:N/A] Volume (cm) : [1:80.425] [N/A:N/A] % Reduction in Area: [1:31.80%] [N/A:N/A] % Reduction in Volume: [1:-118.20%] [N/A:N/A] Position 1 (o'clock): [1:11] Maximum Distance 1 (cm): [1:3.2] Starting Position 1 [1:7] (o'clock): Ending Position 1 [1:10] (o'clock): Maximum Distance 1 (cm): [1:3.2] Tunneling: [1:Yes] [N/A:N/A] Undermining: [1:Yes] [N/A:N/A] Classification: [1:Category/Stage IV] [N/A:N/A] Exudate Amount: [1:Large] [N/A:N/A] Exudate Type: [1:Sanguinous] [N/A:N/A] Exudate Color: [1:red] [N/A:N/A] Foul Odor After Cleansing: [1:Yes] [N/A:N/A] Odor Anticipated Due to [1:No] [N/A:N/A] Product Use: Wound  Margin: [1:Distinct, outline attached] [N/A:N/A] Granulation Amount: [1:Large (67-100%)] [N/A:N/A] Granulation Quality: [1:Red, Pink, Hyper-granulation] [N/A:N/A] Necrotic Amount: [1:Small (1-33%)] [N/A:N/A] Necrotic Tissue: [1:Eschar, Adherent Slough] [N/A:N/A] Exposed Structures: [1:Fascia: Yes Fat Layer (Subcutaneous Tissue) Exposed: Yes] [N/A:N/A] Muscle: Yes Tendon: No Joint: No Bone: No Epithelialization: None N/A N/A Periwound Skin Texture: Induration: Yes N/A N/A Scarring: Yes Excoriation: No Callus: No Crepitus: No Rash: No Periwound Skin Moisture: Maceration: Yes N/A N/A Dry/Scaly: No Periwound Skin Color: Erythema: Yes N/A N/A Rubor: Yes Atrophie Blanche: No Cyanosis: No Ecchymosis: No Hemosiderin Staining: No Mottled: No Pallor: No Erythema Location: Circumferential N/A N/A Temperature: No Abnormality N/A N/A Tenderness on Palpation: Yes N/A N/A Wound Preparation: Ulcer Cleansing: N/A N/A Rinsed/Irrigated with Saline Topical Anesthetic Applied: Other: lidocaine 4% Treatment Notes Electronic Signature(s) Signed: 04/01/2018 5:12:41 PM By: Curtis Sites Entered By: Curtis Sites on 04/01/2018 11:17:34 Jessica Brady ([id number]) -------------------------------------------------------------------------------- Multi-Disciplinary Care Plan Details Patient Name: Jessica Brady. Date of Service: 04/01/2018 11:00 AM Medical Record Number: [id number] Patient Account Number: [id number] Date of Birth/Sex: 1945-06-17 (72 y.o. F) Treating RN: Curtis Sites Primary Care Zoanne Newill: Annita Brod Other Clinician: Referring Media Pizzini: Annita Brod Treating Cosandra Plouffe/Extender: STONE III, HOYT Weeks in Treatment: 27 Active Inactive Orientation to the Wound Care Program Nursing Diagnoses: Knowledge deficit related to the wound healing center program Goals: Patient/caregiver will verbalize understanding of the Wound Healing Center Program Date Initiated: 06/03/2017 Target Resolution Date: 06/17/2017 Goal Status: Active Interventions: Provide education on orientation to the wound center Notes: Pressure Nursing Diagnoses: Knowledge deficit related to causes and risk factors for pressure ulcer development Knowledge deficit related to management of pressures ulcers Potential for impaired tissue integrity related to pressure, friction, moisture, and shear Goals: Patient will remain free from development of additional pressure ulcers Date Initiated: 06/03/2017 Target Resolution Date: 06/17/2017 Goal Status: Active Patient will remain free of pressure ulcers Date Initiated: 06/03/2017 Target Resolution Date: 06/17/2017 Goal Status: Active Patient/caregiver will verbalize risk factors for pressure ulcer development Date Initiated: 06/03/2017 Target Resolution Date: 06/17/2017 Goal Status: Active Interventions: Assess: immobility, friction, shearing, incontinence upon admission and as needed Assess potential for pressure ulcer upon admission and as needed Notes: Wound/Skin Impairment SHEMIA, BEVEL ([id number]) Nursing Diagnoses: Impaired tissue integrity Goals: Patient/caregiver will verbalize  understanding of skin care regimen Date Initiated: 06/03/2017 Target Resolution Date: 06/17/2017 Goal Status: Active Ulcer/skin breakdown will have a volume reduction of 30% by week 4 Date Initiated: 06/03/2017 Target Resolution Date: 06/17/2017 Goal Status: Active Interventions: Assess ulceration(s) every visit Treatment Activities: Patient referred to home care : 06/03/2017 Skin care regimen initiated : 06/03/2017 Notes: Electronic Signature(s) Signed: 04/01/2018 5:12:41 PM By: Curtis Sites Entered By: Curtis Sites on 04/01/2018 11:17:25 Jessica Brady ([id number]) -------------------------------------------------------------------------------- Pain Assessment Details Patient Name: Jessica Brady. Date of Service: 04/01/2018 11:00 AM Medical Record Number: [id number] Patient Account Number: [id number] Date of Birth/Sex: 1945-05-22 (72 y.o. F) Treating RN: Arnette Norris Primary Care Azim Gillingham: Annita Brod Other Clinician: Referring Marcio Hoque: Annita Brod Treating Miliana Gangwer/Extender: STONE III, HOYT Weeks in Treatment: 44 Active Problems Location of Pain Severity and Description of Pain Patient Has Paino No Site Locations Pain Management and Medication Current Pain Management: Electronic Signature(s) Signed: 04/05/2018 1:37:43 PM By: Arnette Norris Entered By: Arnette Norris on 04/01/2018 10:58:34 Jessica Brady ([id number]) -------------------------------------------------------------------------------- Patient/Caregiver Education Details Patient Name: Jessica Brady. Date of Service: 04/01/2018 11:00 AM Medical Record Number: [id number] Patient Account Number: [id number] Date of Birth/Gender: 1946/02/06 (72 y.o. F) Treating RN: Curtis Sites Primary Care Physician:  ASENSO, PHILIP Other Clinician: Referring Physician: Annita Brod Treating Physician/Extender: Linwood Dibbles, HOYT Weeks in Treatment: 43 Education Assessment Education Provided  To: Patient and Caregiver Education Topics Provided Wound/Skin Impairment: Handouts: Other: TREATMENT PLAN Methods: Explain/Verbal Responses: State content correctly Electronic Signature(s) Signed: 04/01/2018 5:12:41 PM By: Curtis Sites Entered By: Curtis Sites on 04/01/2018 12:46:47 Jessica Brady ([id number]) -------------------------------------------------------------------------------- Wound Assessment Details Patient Name: Jessica Brady. Date of Service: 04/01/2018 11:00 AM Medical Record Number: [id number] Patient Account Number: [id number] Date of Birth/Sex: Dec 14, 1945 (72 y.o. F) Treating RN: Huel Coventry Primary Care Neale Marzette: Annita Brod Other Clinician: Referring Sheril Hammond: Annita Brod Treating Tangi Shroff/Extender: STONE III, HOYT Weeks in Treatment: 44 Wound Status Wound Number: 1 Primary Etiology: Pressure Ulcer Wound Location: Sacrum - Medial Wound Status: Open Wounding Event: Pressure Injury Date Acquired: 01/15/2017 Weeks Of Treatment: 44 Clustered Wound: No Photos Photo Uploaded By: Elliot Gurney, BSN, RN, CWS, Kim on 04/01/2018 14:52:34 Wound Measurements Length: (cm) 8 Width: (cm) 4 Depth: (cm) 3.2 Area: (cm) 25.133 Volume: (cm) 80.425 % Reduction in Area: 31.8% % Reduction in Volume: -118.2% Epithelialization: None Tunneling: Yes Position (o'clock): 11 Maximum Distance: (cm) 3.2 Undermining: Yes Starting Position (o'clock): 7 Ending Position (o'clock): 10 Maximum Distance: (cm) 3.2 Wound Description Classification: Category/Stage IV Foul Odor Wound Margin: Distinct, outline attached Due to Pr Exudate Amount: Large Slough/Fi Exudate Type: Sanguinous Exudate Color: red After Cleansing: Yes oduct Use: No brino Yes Wound Bed Granulation Amount: Large (67-100%) Exposed Structure Granulation Quality: Red, Pink, Hyper-granulation Fascia Exposed: Yes YAZMINE, SOREY ([id number]) Necrotic Amount: Small (1-33%) Fat Layer  (Subcutaneous Tissue) Exposed: Yes Necrotic Quality: Eschar, Adherent Slough Tendon Exposed: No Muscle Exposed: Yes Necrosis of Muscle: No Joint Exposed: No Bone Exposed: No Periwound Skin Texture Texture Color No Abnormalities Noted: No No Abnormalities Noted: No Callus: No Atrophie Blanche: No Crepitus: No Cyanosis: No Excoriation: No Ecchymosis: No Induration: Yes Erythema: Yes Rash: No Erythema Location: Circumferential Scarring: Yes Hemosiderin Staining: No Mottled: No Moisture Pallor: No No Abnormalities Noted: No Rubor: Yes Dry / Scaly: No Maceration: Yes Temperature / Pain Temperature: No Abnormality Tenderness on Palpation: Yes Wound Preparation Ulcer Cleansing: Rinsed/Irrigated with Saline Topical Anesthetic Applied: Other: lidocaine 4%, Treatment Notes Wound #1 (Medial Sacrum) Notes HHRN to replace wound vac once gauze is available for use under NPWT, Dakin's soaked gauze with ABD pad on top secured with tape Electronic Signature(s) Signed: 04/01/2018 4:41:19 PM By: Elliot Gurney, BSN, RN, CWS, Kim RN, BSN Entered By: Elliot Gurney, BSN, RN, CWS, Kim on 04/01/2018 11:10:42 Jessica Brady ([id number]) -------------------------------------------------------------------------------- Vitals Details Patient Name: Jessica Brady. Date of Service: 04/01/2018 11:00 AM Medical Record Number: [id number] Patient Account Number: [id number] Date of Birth/Sex: [birth year] (72 y.o. F) Treating RN: Huel Coventry Primary Care Cathlene Gardella: Annita Brod Other Clinician: Referring Ferry Matthis: Annita Brod Treating Shawnie Nicole/Extender: STONE III, HOYT Weeks in Treatment: 44 Vital Signs Time Taken: 11:00 Temperature (F): 98.2 Height (in): 70 Pulse (bpm): 76 Respiratory Rate (breaths/min): 18 Blood Pressure (mmHg): 147/70 Reference Range: 80 - 120 mg / dl Electronic Signature(s) Signed: 04/01/2018 4:41:19 PM By: Elliot Gurney, BSN, RN, CWS, Kim RN, BSN Entered By: Elliot Gurney, BSN, RN, CWS,  Kim on 04/01/2018 11:01:43

## 2018-04-15 ENCOUNTER — Encounter: Payer: Medicare Other | Attending: Physician Assistant | Admitting: Physician Assistant

## 2018-04-15 DIAGNOSIS — E782 Mixed hyperlipidemia: Secondary | ICD-10-CM | POA: Insufficient documentation

## 2018-04-15 DIAGNOSIS — M4800 Spinal stenosis, site unspecified: Secondary | ICD-10-CM | POA: Diagnosis not present

## 2018-04-15 DIAGNOSIS — Z7401 Bed confinement status: Secondary | ICD-10-CM | POA: Diagnosis not present

## 2018-04-15 DIAGNOSIS — G894 Chronic pain syndrome: Secondary | ICD-10-CM | POA: Insufficient documentation

## 2018-04-15 DIAGNOSIS — Z888 Allergy status to other drugs, medicaments and biological substances status: Secondary | ICD-10-CM | POA: Insufficient documentation

## 2018-04-15 DIAGNOSIS — L89154 Pressure ulcer of sacral region, stage 4: Secondary | ICD-10-CM | POA: Insufficient documentation

## 2018-04-22 NOTE — Progress Notes (Signed)
DARY, KRETSCHMER ([id number]) Visit Report for 04/15/2018 Arrival Information Details [name], Jessica [name]. Date of Service: 04/15/2018 10:00 AM Medical Record Number: [id number] Patient Account Number: [id number] Date of Birth/Sex: 06/23/1945 (73 y.o. F) Treating RN: Curtis Sites Primary Care Vishal Sandlin: Annita Brod Other Clinician: Referring Muhamad Serano: Annita Brod Treating Brandi Tomlinson/Extender: Linwood Dibbles, HOYT Weeks in Treatment: 46 Visit Information History Since Last Visit Added or deleted any medications: No Patient Arrived: Stretcher Any new allergies or adverse reactions: No Arrival Time: 10:09 Had a fall or experienced change in No Accompanied By: EMS and son activities of daily living that may affect Transfer Assistance: Stretcher risk of falls: Patient Identification Verified: Yes Signs or symptoms of abuse/neglect since last visito No Secondary Verification Process Yes Hospitalized since last visit: No Completed: Implantable device outside of the clinic excluding No Patient Has Alerts: Yes cellular tissue based products placed in the center Patient Alerts: ALLERGIC TO since last visit: ZINC Has Dressing in Place as Prescribed: Yes Pain Present Now: No Electronic Signature(s) Signed: 04/15/2018 4:39:33 PM By: Dayton Martes RCP, RRT, CHT Entered By: Dayton Martes on 04/15/2018 10:10:48 Jessica [name] ([id number]) -------------------------------------------------------------------------------- Clinic Level of Care Assessment Details [name]. Date of Service: 04/15/2018 10:00 AM Medical Record Number: [id number] Patient Account Number: [id number] Date of Birth/Sex: 1945/11/20 (73 y.o. F) Treating RN: Curtis Sites Primary Care Zachery Niswander: Annita Brod Other Clinician: Referring Nic Lampe: Annita Brod Treating Congetta Odriscoll/Extender: Linwood Dibbles, HOYT Weeks in Treatment: 46 Clinic Level of Care  Assessment Items TOOL 4 Quantity Score []  - Use when only an EandM is performed on FOLLOW-UP visit 0 ASSESSMENTS - Nursing Assessment / Reassessment X - Reassessment of Co-morbidities (includes updates in patient status) 1 10 X- 1 5 Reassessment of Adherence to Treatment Plan ASSESSMENTS - Wound and Skin Assessment / Reassessment X - Simple Wound Assessment / Reassessment - one wound 1 5 []  - 0 Complex Wound Assessment / Reassessment - multiple wounds []  - 0 Dermatologic / Skin Assessment (not related to wound area) ASSESSMENTS - Focused Assessment []  - Circumferential Edema Measurements - multi extremities 0 []  - 0 Nutritional Assessment / Counseling / Intervention []  - 0 Lower Extremity Assessment (monofilament, tuning fork, pulses) []  - 0 Peripheral Arterial Disease Assessment (using hand held doppler) ASSESSMENTS - Ostomy and/or Continence Assessment and Care []  - Incontinence Assessment and Management 0 []  - 0 Ostomy Care Assessment and Management (repouching, etc.) PROCESS - Coordination of Care X - Simple Patient / Family Education for ongoing care 1 15 []  - 0 Complex (extensive) Patient / Family Education for ongoing care []  - 0 Staff obtains Chiropractor, Records, Test Results / Process Orders []  - 0 Staff telephones HHA, Nursing Homes / Clarify orders / etc []  - 0 Routine Transfer to another Facility (non-emergent condition) []  - 0 Routine Hospital Admission (non-emergent condition) []  - 0 New Admissions / Manufacturing engineer / Ordering NPWT, Apligraf, etc. []  - 0 Emergency Hospital Admission (emergent condition) X- 1 10 Simple Discharge Coordination LAURIANN, THATCHER. ([id number]) []  - 0 Complex (extensive) Discharge Coordination PROCESS - Special Needs []  - Pediatric / Minor Patient Management 0 []  - 0 Isolation Patient Management []  - 0 Hearing / Language / Visual special needs []  - 0 Assessment of Community assistance (transportation, D/C planning,  etc.) []  - 0 Additional assistance / Altered mentation []  - 0 Support Surface(s) Assessment (bed, cushion, seat, etc.) INTERVENTIONS - Wound Cleansing / Measurement X - Simple Wound Cleansing - one wound  1 5 []  - 0 Complex Wound Cleansing - multiple wounds X- 1 5 Wound Imaging (photographs - any number of wounds) []  - 0 Wound Tracing (instead of photographs) X- 1 5 Simple Wound Measurement - one wound []  - 0 Complex Wound Measurement - multiple wounds INTERVENTIONS - Wound Dressings X - Small Wound Dressing one or multiple wounds 1 10 []  - 0 Medium Wound Dressing one or multiple wounds []  - 0 Large Wound Dressing one or multiple wounds X- 1 5 Application of Medications - topical []  - 0 Application of Medications - injection INTERVENTIONS - Miscellaneous []  - External ear exam 0 []  - 0 Specimen Collection (cultures, biopsies, blood, body fluids, etc.) []  - 0 Specimen(s) / Culture(s) sent or taken to Lab for analysis []  - 0 Patient Transfer (multiple staff / Nurse, adult / Similar devices) []  - 0 Simple Staple / Suture removal (25 or less) []  - 0 Complex Staple / Suture removal (26 or more) []  - 0 Hypo / Hyperglycemic Management (close monitor of Blood Glucose) []  - 0 Ankle / Brachial Index (ABI) - do not check if billed separately X- 1 5 Vital Signs Boger, Arlissa W. ([id number]) Has the patient been seen at the hospital within the last three years: Yes Total Score: 80 Level Of Care: New/Established - Level 3 Electronic Signature(s) Signed: 04/15/2018 5:14:43 PM By: Curtis Sites Entered By: Curtis Sites on 04/15/2018 10:49:45 Jessica [name] ([id number]) -------------------------------------------------------------------------------- Encounter Discharge Information Details [name]. Date of Service: 04/15/2018 10:00 AM Medical Record Number: [id number] Patient Account Number: [id number] Date of Birth/Sex: 22-Apr-1945 (73 y.o.  F) Treating RN: Curtis Sites Primary Care Vincie Linn: Annita Brod Other Clinician: Referring Arminda Foglio: Annita Brod Treating Weronika Birch/Extender: Linwood Dibbles, HOYT Weeks in Treatment: 72 Encounter Discharge Information Items Discharge Condition: Stable Ambulatory Status: Stretcher Discharge Destination: Home Transportation: Ambulance Accompanied By: son Schedule Follow-up Appointment: Yes Clinical Summary of Care: Electronic Signature(s) Signed: 04/15/2018 5:14:43 PM By: Curtis Sites Entered By: Curtis Sites on 04/15/2018 10:50:39 Jessica [name] ([id number]) -------------------------------------------------------------------------------- Lower Extremity Assessment Details [name]. Date of Service: 04/15/2018 10:00 AM Medical Record Number: [id number] Patient Account Number: [id number] Date of Birth/Sex: [birth year] (73 y.o. F) Treating RN: Rema Jasmine Primary Care Eira Alpert: Annita Brod Other Clinician: Referring Trannie Bardales: Annita Brod Treating Saverio Kader/Extender: Linwood Dibbles, HOYT Weeks in Treatment: 46 Electronic Signature(s) Signed: 04/15/2018 2:48:30 PM By: Rema Jasmine Entered By: Rema Jasmine on 04/15/2018 10:14:48 Jessica [name] ([id number]) -------------------------------------------------------------------------------- Multi Wound Chart Details [name]. Date of Service: 04/15/2018 10:00 AM Medical Record Number: [id number] Patient Account Number: [id number] Date of Birth/Sex: 04-03-1946 (73 y.o. F) Treating RN: Curtis Sites Primary Care Tashaya Ancrum: Annita Brod Other Clinician: Referring Aislinn Feliz: Annita Brod Treating Edi Gorniak/Extender: STONE III, HOYT Weeks in Treatment: 46 Vital Signs Height(in): 70 Pulse(bpm): 75 Weight(lbs): Blood Pressure(mmHg): 140/58 Body Mass Index(BMI): Temperature(F): 98.2 Respiratory Rate 18 (breaths/min): Photos: [1:No Photos] [N/A:N/A] Wound Location: [1:Sacrum - Medial]  [N/A:N/A] Wounding Event: [1:Pressure Injury] [N/A:N/A] Primary Etiology: [1:Pressure Ulcer] [N/A:N/A] Date Acquired: [1:01/15/2017] [N/A:N/A] Weeks of Treatment: [1:46] [N/A:N/A] Wound Status: [1:Open] [N/A:N/A] Measurements L x W x D [1:7.9x5x2.4] [N/A:N/A] (cm) Area (cm) : [1:31.023] [N/A:N/A] Volume (cm) : [1:74.456] [N/A:N/A] % Reduction in Area: [1:15.80%] [N/A:N/A] % Reduction in Volume: [1:-102.00%] [N/A:N/A] Starting Position 1 [1:8] (o'clock): Ending Position 1 [1:11] (o'clock): Maximum Distance 1 (cm): [1:2.1] Undermining: [1:Yes] [N/A:N/A] Classification: [1:Category/Stage IV] [N/A:N/A] Exudate Amount: [1:Large] [N/A:N/A] Exudate Type: [1:Sanguinous] [N/A:N/A] Exudate Color: [1:red] [N/A:N/A] Foul Odor After  Cleansing: [1:Yes] [N/A:N/A] Odor Anticipated Due to [1:No] [N/A:N/A] Product Use: Wound Margin: [1:Distinct, outline attached] [N/A:N/A] Granulation Amount: [1:Large (67-100%)] [N/A:N/A] Granulation Quality: [1:Red, Pink, Hyper-granulation] [N/A:N/A] Necrotic Amount: [1:Small (1-33%)] [N/A:N/A] Necrotic Tissue: [1:Eschar, Adherent Slough] [N/A:N/A] Exposed Structures: [1:Fascia: Yes Fat Layer (Subcutaneous Tissue) Exposed: Yes Muscle: Yes Tendon: No] [N/A:N/A] Joint: No Bone: No Epithelialization: None N/A N/A Periwound Skin Texture: Induration: Yes N/A N/A Scarring: Yes Excoriation: No Callus: No Crepitus: No Rash: No Periwound Skin Moisture: Maceration: Yes N/A N/A Dry/Scaly: No Periwound Skin Color: Erythema: Yes N/A N/A Rubor: Yes Atrophie Blanche: No Cyanosis: No Ecchymosis: No Hemosiderin Staining: No Mottled: No Pallor: No Erythema Location: Circumferential N/A N/A Temperature: No Abnormality N/A N/A Tenderness on Palpation: Yes N/A N/A Wound Preparation: Ulcer Cleansing: N/A N/A Rinsed/Irrigated with Saline Topical Anesthetic Applied: Other: lidocaine 4% Treatment Notes Electronic Signature(s) Signed: 04/15/2018 5:14:43 PM By:  Curtis Sitesorthy, Joanna Entered By: Curtis Sitesorthy, Joanna on 04/15/2018 10:42:51 Jessica BradyVERMAN, Peggi W. ([id number]) -------------------------------------------------------------------------------- Multi-Disciplinary Care Plan Details Patient Name: Jessica BradyVERMAN, Otisha W. Date of Service: 04/15/2018 10:00 AM Medical Record Number: [id number] Patient Account Number: [id number] Date of Birth/Sex: [birth year] (73 y.o. F) Treating RN: Curtis Sitesorthy, Joanna Primary Care Jehieli Brassell: Annita BrodASENSO, PHILIP Other Clinician: Referring Anael Rosch: Annita BrodASENSO, PHILIP Treating Lorrain Rivers/Extender: Linwood DibblesSTONE III, HOYT Weeks in Treatment: 6246 Active Inactive Orientation to the Wound Care Program Nursing Diagnoses: Knowledge deficit related to the wound healing center program Goals: Patient/caregiver will verbalize understanding of the Wound Healing Center Program Date Initiated: 06/03/2017 Target Resolution Date: 06/17/2017 Goal Status: Active Interventions: Provide education on orientation to the wound center Notes: Pressure Nursing Diagnoses: Knowledge deficit related to causes and risk factors for pressure ulcer development Knowledge deficit related to management of pressures ulcers Potential for impaired tissue integrity related to pressure, friction, moisture, and shear Goals: Patient will remain free from development of additional pressure ulcers Date Initiated: 06/03/2017 Target Resolution Date: 06/17/2017 Goal Status: Active Patient will remain free of pressure ulcers Date Initiated: 06/03/2017 Target Resolution Date: 06/17/2017 Goal Status: Active Patient/caregiver will verbalize risk factors for pressure ulcer development Date Initiated: 06/03/2017 Target Resolution Date: 06/17/2017 Goal Status: Active Interventions: Assess: immobility, friction, shearing, incontinence upon admission and as needed Assess potential for pressure ulcer upon admission and as needed Notes: Wound/Skin Impairment Jessica BradyOVERMAN, Fae W. ([id number]) Nursing  Diagnoses: Impaired tissue integrity Goals: Patient/caregiver will verbalize understanding of skin care regimen Date Initiated: 06/03/2017 Target Resolution Date: 06/17/2017 Goal Status: Active Ulcer/skin breakdown will have a volume reduction of 30% by week 4 Date Initiated: 06/03/2017 Target Resolution Date: 06/17/2017 Goal Status: Active Interventions: Assess ulceration(s) every visit Treatment Activities: Patient referred to home care : 06/03/2017 Skin care regimen initiated : 06/03/2017 Notes: Electronic Signature(s) Signed: 04/15/2018 5:14:43 PM By: Curtis Sitesorthy, Joanna Entered By: Curtis Sitesorthy, Joanna on 04/15/2018 10:42:43 Jessica BradyVERMAN, Tayah W. ([id number]) -------------------------------------------------------------------------------- Pain Assessment Details Patient Name: Jessica BradyVERMAN, Brandalynn W. Date of Service: 04/15/2018 10:00 AM Medical Record Number: [id number] Patient Account Number: [id number] Date of Birth/Sex: [birth year] (73 y.o. F) Treating RN: Curtis Sitesorthy, Joanna Primary Care Neisha Hinger: Annita BrodASENSO, PHILIP Other Clinician: Referring Aveyah Greenwood: Annita BrodASENSO, PHILIP Treating Crystallee Werden/Extender: STONE III, HOYT Weeks in Treatment: 46 Active Problems Location of Pain Severity and Description of Pain Patient Has Paino No Site Locations Pain Management and Medication Current Pain Management: Electronic Signature(s) Signed: 04/15/2018 4:39:33 PM By: Sallee ProvencalWallace, RCP,RRT,CHT, Sallie RCP, RRT, CHT Signed: 04/15/2018 5:14:43 PM By: Curtis Sitesorthy, Joanna Entered By: Dayton MartesWallace, RCP,RRT,CHT, Sallie on 04/15/2018 10:10:56 Jessica BradyVERMAN, Quina W. ([id number]) -------------------------------------------------------------------------------- Patient/Caregiver Education Details Patient Name: Jessica BradyVERMAN, Deneka W. Date of Service: 04/15/2018  10:00 AM Medical Record Number: [id number] Patient Account Number: [id number] Date of Birth/Gender: 09-02-1945 (73 y.o. F) Treating RN: Curtis Sites Primary Care Physician: Annita Brod Other  Clinician: Referring Physician: Annita Brod Treating Physician/Extender: Skeet Simmer in Treatment: 85 Education Assessment Education Provided To: Patient and Caregiver Education Topics Provided Wound/Skin Impairment: Handouts: Other: wound care as ordered Methods: Demonstration, Explain/Verbal Responses: State content correctly Electronic Signature(s) Signed: 04/15/2018 5:14:43 PM By: Curtis Sites Entered By: Curtis Sites on 04/15/2018 10:50:04 Jessica [name] ([id number]) -------------------------------------------------------------------------------- Wound Assessment Details [name]. Date of Service: 04/15/2018 10:00 AM Medical Record Number: [id number] Patient Account Number: [id number] Date of Birth/Sex: 18-Jul-1945 (73 y.o. F) Treating RN: Rema Jasmine Primary Care Jeptha Hinnenkamp: Annita Brod Other Clinician: Referring Sherell Christoffel: Annita Brod Treating Janari Gagner/Extender: STONE III, HOYT Weeks in Treatment: 46 Wound Status Wound Number: 1 Primary Etiology: Pressure Ulcer Wound Location: Sacrum - Medial Wound Status: Open Wounding Event: Pressure Injury Date Acquired: 01/15/2017 Weeks Of Treatment: 46 Clustered Wound: No Photos Photo Uploaded By: Rema Jasmine on 04/15/2018 10:51:13 Wound Measurements Length: (cm) 7.9 % Reductio Width: (cm) 5 % Reductio Depth: (cm) 2.4 Epithelial Area: (cm) 31.023 Tunneling Volume: (cm) 74.456 Undermini Startin Ending Maximum n in Area: 15.8% n in Volume: -102% ization: None : No ng: Yes g Position (o'clock): 8 Position (o'clock): 11 Distance: (cm) 2.1 Wound Description Classification: Category/Stage IV Foul Odor Wound Margin: Distinct, outline attached Due to Pro Exudate Amount: Large Slough/Fib Exudate Type: Sanguinous Exudate Color: red After Cleansing: Yes duct Use: No rino Yes Wound Bed Granulation Amount: Large (67-100%) Exposed Structure Granulation Quality: Red, Pink,  Hyper-granulation Fascia Exposed: Yes Necrotic Amount: Small (1-33%) Fat Layer (Subcutaneous Tissue) Exposed: Yes Necrotic Quality: Eschar, Adherent Slough Tendon Exposed: No Muscle Exposed: Yes SAMIYA, MERVIN. ([id number]) Necrosis of Muscle: No Joint Exposed: No Bone Exposed: No Periwound Skin Texture Texture Color No Abnormalities Noted: No No Abnormalities Noted: No Callus: No Atrophie Blanche: No Crepitus: No Cyanosis: No Excoriation: No Ecchymosis: No Induration: Yes Erythema: Yes Rash: No Erythema Location: Circumferential Scarring: Yes Hemosiderin Staining: No Mottled: No Moisture Pallor: No No Abnormalities Noted: No Rubor: Yes Dry / Scaly: No Maceration: Yes Temperature / Pain Temperature: No Abnormality Tenderness on Palpation: Yes Wound Preparation Ulcer Cleansing: Rinsed/Irrigated with Saline Topical Anesthetic Applied: Other: lidocaine 4%, Treatment Notes Wound #1 (Medial Sacrum) Notes HHRN to replace wound vac once gauze is available for use under NPWT, Dakin's soaked gauze with ABD pad on top secured with tape Electronic Signature(s) Signed: 04/15/2018 2:48:30 PM By: Rema Jasmine Entered By: Rema Jasmine on 04/15/2018 10:31:00 Jessica [name] ([id number]) -------------------------------------------------------------------------------- Vitals Details [name]. Date of Service: 04/15/2018 10:00 AM Medical Record Number: [id number] Patient Account Number: [id number] Date of Birth/Sex: January 22, 1946 (73 y.o. F) Treating RN: Curtis Sites Primary Care Dovid Bartko: Annita Brod Other Clinician: Referring Ikeya Brockel: Annita Brod Treating Ryelan Kazee/Extender: STONE III, HOYT Weeks in Treatment: 46 Vital Signs Time Taken: 10:11 Temperature (F): 98.2 Height (in): 70 Pulse (bpm): 75 Respiratory Rate (breaths/min): 18 Blood Pressure (mmHg): 140/58 Reference Range: 80 - 120 mg / dl Electronic Signature(s) Signed: 04/15/2018 4:39:33  PM By: Dayton Martes RCP, RRT, CHT Entered By: Dayton Martes on 04/15/2018 10:13:31

## 2018-04-22 NOTE — Progress Notes (Signed)
Jessica [name] ([id number]) Visit Report for 04/15/2018 Chief Complaint Document Details [name], Jessica [name]. Date of Service: 04/15/2018 10:00 AM Medical Record Number: [id number] Patient Account Number: [id number] Date of Birth/Sex: [birth year] (73 y.o. F) Treating RN: Curtis Sites Primary Care Provider: Annita Brod Other Clinician: Referring Provider: Annita Brod Treating Provider/Extender: Linwood Dibbles, HOYT Weeks in Treatment: 46 Information Obtained from: Patient Chief Complaint she is here for evaluation of a sacral ulcer and bilateral lower extremity ulcers Electronic Signature(s) Signed: 04/21/2018 10:02:40 AM By: Lenda Kelp PA-C Entered By: Lenda Kelp on 04/15/2018 10:11:58 Jessica [name] ([id number]) -------------------------------------------------------------------------------- HPI Details [name]. Date of Service: 04/15/2018 10:00 AM Medical Record Number: [id number] Patient Account Number: [id number] Date of Birth/Sex: Aug 20, 1945 (73 y.o. F) Treating RN: Curtis Sites Primary Care Provider: Annita Brod Other Clinician: Referring Provider: Annita Brod Treating Provider/Extender: Linwood Dibbles, HOYT Weeks in Treatment: 46 History of Present Illness HPI Description: 05/27/17-she is here in initial evaluation for a left-sided sacral stage IV pressure ulcer and bilateral lower extremity, lateral aspect, unstageable pressure ulcers. She is accompanied by her husband and her son, who are her primary caregivers. She is bedbound secondary to spinal stenosis. According to her son and husband she was hospitalized from 10/5-11/1 with healthcare associated pneumonia and altered mental status. During her hospitalization she was intubated, extubated on 10/27. She was discharged with Foley catheter and follow up with urology. According to her son and spouse she developed the sacral ulcer during hospitalization. Home health has been  applying Santyl daily. She does have a low air loss mattress and is repositioned every 2-3 hours per her family's report. According to the son and spouse she had an appointment with urology on 12/18 and during that appointment developed discoloration to her bilateral lower extremities which ultimately developed into unstageable pressure ulcers to the lateral aspects of her bilateral lower extremities. There has been no topical treatment applied to these. She continues to have home health. There is no concerns expressed regarding dietary intake, stating she eats 3 meals a day, eating was provided; she is supplemented with boost with protein. 06/03/17-she is here in follow-up evaluation for sacral and bilateral lower extremity ulcers. Plain film x-ray done today reveals no distraction to the sacrum or coccyx, no visible abnormalities. Home health has ordered the negative pressure wound system but it has not been initiated. We will continue with Hydrofera Blue until initiation of negative pressure wound system and continue with Santyl to bilateral lower extremity ulcers. Follow-up next week 06/10/17-she is here in follow-up evaluation for sacral and bilateral lower extremity ulcers. The wound VAC will be available tomorrow per home health. We will initiate wound VAC therapy to the sacral ulcer 3 times weekly (Thursday, Saturday/Sunday, Tuesday). We will continue with Santyl to the lower extremity ulcers. The patient's son is checking into home health therapy over the weekend for [facility] changes, with the understanding that if VAC changes cannot be performed over the weekend he will need to change his mother's appointments to Monday, Wednesday or Friday. The sacral ulcer clinically does not appear infected but there has been a change in the amount of drainage acutely, there is no significant amount of devitalized tissue, there is no malodor. Wound culture was obtained to evaluate for occult infection we will  hold off on antibiotic therapy until sensitivities are resulted. 06/18/17 on evaluation today patient appears to be doing fairly well in my opinion although this is the first time I  have seen this patient she has been previously evaluated by Tacey RuizLeah here in our office. She is going to be switching to Fridays to see me due to the Wound VAC schedule being changed on Monday, Wednesday, and Friday. Subsequently she seems to be doing fairly well with the Wound VAC. Her son who was present during evaluation today states that he somewhat stresses of the Wound VAC in making sure that it was functioning appropriately. With that being said everything seems to be working well he knows what settings on the [facility] HVAC itself to look at and ensure that it is functioning properly. Overall the wound appears to be nice and clean there is no need for debridement today. She has no discomfort in her bilateral lower extremity ulcers also appear to be improving based on measurements and what this honest tell me about the overall appearance. 07/02/17 on evaluation today I noted in patients wound bed that was actually an odor that had not previously been noted. Subsequently there was also a small area of bone that was not previously noted during my last evaluation. This did appear to be necrotic and was being somewhat forced out by the body around the region of granulation. She does not have any pain which is good news. Nonetheless the overall appearance of the ulcer is making me concerned for the patient having developed osteomyelitis. Currently she is not been on any antibiotics and the Wound VAC has been doing fairly well in general. With that being said I do not think we need to continue the Wound VAC if she potentially has a bone infection. At least not until it is properly addressed with antibiotics. 07/09/17 on evaluation today patient appears to actually be doing very well in regard to her sacral ulcer compared to last  week. There is actually no exposed bone at this point. 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 With that being said still I do believe she needs to have a referral to infectious disease due to the osteomyelitis I am gonna recommend that she continue with the doxycycline based on the culture results which showed Eikenella Corrodens as the Jessica BradyOVERMAN, [name] ([id number]) organism identified that tetracycline should work for. She does not seem to be having any discomfort whatsoever at this point. 07/16/17 on evaluation today patient actually appears to be doing rather well in regard to her sacral wound. I think that the original wound site actually appears much better than previously noted. With that being said she does have a new superficial injury on the right sacral region which appears to be due to according to the sign transport that occurred for her MRI unfortunately. Fortunately this is not too deep and I do think it can be managed but it was a new area that was not previously noted. Otherwise she has been tolerating the Dakinos soaked gauze packing without complication. 07/30/17 on evaluation today patient presents for reevaluation concerning her sacral wound. She has been tolerating the dressing changes without complication. With that being said her wound is doing so well at this point that I think she may be at the point where we could reinitiate the Wound VAC currently and hopefully see good results from this. I'm very pleased with how she has responded to the Dakinos soaked gauze packing. 08/13/17 evaluation today patient appears to be doing excellent in  regard to her lower extremity ulcer this seems to be cleaning up very nicely. In regard to the sacral ulcer the area of trauma on the right lateral portion of the wound actually appears  to be much better than previously noted during the last evaluation. The word has definitely filled in and the Wound VAC appears to be helpful I do believe. Overall I'm very pleased with how things seem to be progressing at this time. Patient likewise is also happy that things are doing well. 08/27/17 on evaluation today patient presents for follow-up concerning her ongoing issues with her sacral ulcer and lower extremity ulcer. Fortunately she has been tolerating the dressing changes well complication the Wound VAC in general seems to have done very well up to this point. She does not have any evidence of infection which is good news. She does have some dark discoloration in central portion of the wound which is troublesome for the possibility of pressure injury to the site it sounds like her prior air mattress was not functioning properly her son has just bought a new one for her this is doing much better. Otherwise things seem to be progressing nicely. 09/10/17 on evaluation today patient presents for follow-up concerning her sacral wound and lower extremity ulcer. She has been tolerating the switch of her dressing changes to the Dakinos soaked gauze dressing very well in regard to the sacrum. The left lateral lower extremity ulcer appears to have a new area open just distal to the one that we have been treating with Santyl and this is new since her last visit. There does not appear to be any evidence of infection which is good news. With that being said there's a lot of maceration here at the site and I feel this is likely due to the switch and the dressings it appears that due to the sticking of the dressings it will switch to utilizing a telfa pad over the Santyl. I think this caused a lot of drainage to be collected and situated in the region just under the dressing which has led to this causing which duration of breakdown. Overall there does not appear to be any significant pain which is good  news. Of note I did actually have a conversation with the radiologist who is an interventional radiologist with [facility]. 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 With that being said there does not appear to be any rush to get this done according to the interventional radiologist whom I spoke with. Therefore after discussion it was determined that there gonna wait about six months prior to considering the procedure to get time for hopefully the sacral wound to heal in the interim to at least some degree. 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 According to nursing staff these are all improved, the negative pressure wound therapy system was put on hold last week. We will resume negative pressure wound therapy today, continue with Santyl to the lower extremity and she will follow-up next week. 09/24/17 on evaluation today unfortunately the patient's wound appears to be doing significantly worse compared to last week's evaluation and even my valuation the week before. She actually has bone noted on the left wound margin in the region of undermining unfortunately. This was not noted during the last evaluation. I am concerned about the fact that this seems to be worse not better since  our last visit with her. My biggest concern is that she's likely developing a worsening osteomyelitis. This was discussed with patient and her son today during the office visit. 05/03/17 on evaluation today patient actually appears to be doing rather well in regard to her sacral ulcer compared to last week's evaluation. She has been tolerating the dressing changes without complication. Fortunately even though we're not utilizing the Wound VAC she seems to be making some strides in seeing the wound area overall improved quite dramatically in my pinion in just one weeks time. She also seems to  be staying off of this to the point that I do not see any evidence of new injury which is excellent news. Whatever she's been doing in that regard over the past week I would want her to continue. I did also review the results of her bone culture which revealed group B strep as the responsible organism. Again this is what's causing her osteomyelitis she does have infectious disease appointment scheduled for 10/11/17 10/08/1898 valuation today patient appears to be doing better for the most part in regard to the sacral wound. Overall she is showing signs of having granulation of the bone which is good news. There is an area towards the left of the wound where she does seem to be showing some signs of opening this it would be due to additional pressure to the area unfortunately. Jessica [name], Jessica [name]. ([id number]) There does not appear to be any evidence of infection spreading which is good news that do not see any evidence of significant purulent discharge which is also good news. 10/26/17 on evaluation today patient sacral ulcer actually appears to be very healthy and doing well in regard to the overall appearance of the wound. With that being said she does seem to be having some issues at this point in time with some shear/friction injury of the sacrum from where she was transported to [facility] for her infectious disease appointment. She has been placed on IV antibiotic therapy I will have to go back and find that note for review as I did not have access to that today. Nonetheless I will add the next visit be sure to document the exact antibiotics that she is taking at this point. Nonetheless I do believe the wound is doing better there's no bone exposure nothing that requires debridement in regard to the sacral wound. Likewise her left lateral lower extremity ulcer also appears to be doing significantly better. In general I feel like things are showing signs of improvement all around. 11/12/17 on  evaluation today patient appears to be doing rather well in regard to her sacral wound. In general I do see signs of granulation at this point. Fortunately there does not appear to be any evidence of infection which is good news as well. In general overall happy with the appearance. With that being said I'll be see I would like for this to be progressing more rapidly as with the patient and her son but nonetheless at least things do not appear to be doing worse. Her lower extremity ulcer is doing significantly better. 11/26/17 on evaluation today patient appears to be doing a little better in regard to the sacral wound especially in the peripheral of the wound bed. She actually did have an abrasion on the right gluteal region that's completely resolved to this point. Her lower extremity also appears to be for the most part completely resolved. Overall the sacrum itself seems to be the one thing that is  still open and given her trouble. No fevers, chills, nausea, or vomiting noted at this time. She actually has an appointment next week with infectious disease for recheck to see how things are doing. She maybe switch to oral medications at that point. 12/09/17 on evaluation today patient actually appears to be doing much better in regard to her sacral wound. I do feel like she has made good progress at this time as far as healing is concerned. In fact the wound looks better to me today than it has in quite some time. She is on oral antibiotic therapy and no longer on the IV antibiotics. She did see infectious disease last week. 12/24/17 on evaluation today patient's wounds actually appear to be doing decently well in regard to the sacral wound in particular. When it comes to her lower extremity ulcers both appear to be completely healed at this point in the one area where she had lived the rash has completely resolved at this time. Overall very pleased with the progress that has been made. Nonetheless  the patient seems to be doing well in general. She still does have quite a bit of healing to do in regard to the sacral wound but I feel like she is making progress. 01/07/18 on evaluation today patient actually appears to be doing excellent today regard to her sacral wound. The granular quality in the base of the wound is excellent and shown signs of good improvement there's no evidence of contusion nor deep tissue injury and the periwound actually appears to be doing well. In general I'm very pleased with the overall appearance. 02/04/18 on evaluation today patient's wound actually appears to be doing decently well as far as the granulation is concerned. She has been tolerating the dressing changes without complication. Right now we are still using the Wound VAC. Nonetheless overall there apparently is some cater to the wound and this is concerning simply for the fact that she could be developing a soft tissue infection again. She's had this happen in the past and at that time responded very well to the antibiotics. Nonetheless I don't think I would likely put her on anything prophylactically but rather wait for the results of the culture to return. 02/18/18 on evaluation today patient actually appears to be doing fairly well. She has been tolerating the dressing changes. And the Wound VAC seems to be doing well. Overall I'm very pleased with how things have improved over the past couple weeks since I last saw her. Fortunately there does not appear to be any evidence of overt infection at this time which is good news. She has been taking the antibiotics without complication. Specifically that is the Bactrim DS which was prescribed on 02/08/18. 03/18/18 on evaluation today patient's wound actually does have quite a bit of odor noted compared to previous. With that being said the wound itself overall does not appear to be too significant or worse. There is no event noted in the wound bed which is good  news. With that being said the patient has been worried as the home health nurse stated that she thought she saw bone in the base of the wound. Nonetheless there's a little bit of bruising along the left lateral border of the wound bed which again does not appear to be terrible we have noticed this before but other than this the majority the wound bed appears to be doing excellent in my pinion. I am wondering if there may be some bacterial colonization. This  could be leading to some of the odor that we are noting with the Wound VAC. Jessica [name], Jessica [name] ([id number]) 04/01/18 on evaluation today patient appears to actually be doing well in regard to the overall appearance of the wound itself. She also does not have any odor at this point in regard to the wound surface and I do believe the Bacons is extremely helpful in this regard. With that being said she does have a little bit of deep tissue injury on the medial aspect of the wound. Again last time it was more lateral. The lateral seems to have improved quite well. Nonetheless she also notes by way of her son that this has been very macerated in the past several weeks since I last saw her. Though things overall seem to be doing better I still think that the Wound VAC may help with managing her fluid better I'm wondering if we can attempt a gauze Wound VAC my hope is that this will be able to manage the moisture control as well as continuing with managing the odor of the wound and bacterial load since the Dakinos has done excellent in helping in that regard. 04/15/17 on evaluation today patient actually appears to be doing much better in regard to the overall appearance of her sacral wound. I do believe that the gauze Vac has been of benefit for her. Overall she is tolerating this without complication which is also good news. There is no sign of injury to the sacral region at this point. Electronic Signature(s) Signed: 04/21/2018 10:02:40 AM By: Lenda Kelp PA-C Entered By: Lenda Kelp on 04/17/2018 08:47:09 Jessica [name] ([id number]) -------------------------------------------------------------------------------- Physical Exam Details [name]. Date of Service: 04/15/2018 10:00 AM Medical Record Number: [id number] Patient Account Number: [id number] Date of Birth/Sex: 1945-05-23 (73 y.o. F) Treating RN: Curtis Sites Primary Care Provider: Annita Brod Other Clinician: Referring Provider: Annita Brod Treating Provider/Extender: STONE III, HOYT Weeks in Treatment: 46 Constitutional Well-nourished and well-hydrated in no acute distress. Respiratory normal breathing without difficulty. clear to auscultation bilaterally. Cardiovascular regular rate and rhythm with normal S1, S2. Psychiatric this patient is able to make decisions and demonstrates good insight into disease process. Alert and Oriented x 3. pleasant and cooperative. Notes Currently my suggestion is gonna be based on what I see visually that we continue with the gauze vac at this point. Patient son is in agreement with this plan especially since everything seems to be looking so well. No sharp debridement was necessary today. Electronic Signature(s) Signed: 04/21/2018 10:02:40 AM By: Lenda Kelp PA-C Entered By: Lenda Kelp on 04/17/2018 08:47:55 Jessica [name] ([id number]) -------------------------------------------------------------------------------- Physician Orders Details [name]. Date of Service: 04/15/2018 10:00 AM Medical Record Number: [id number] Patient Account Number: [id number] Date of Birth/Sex: 1945-05-03 (73 y.o. F) Treating RN: Curtis Sites Primary Care Provider: Annita Brod Other Clinician: Referring Provider: Annita Brod Treating Provider/Extender: Linwood Dibbles, HOYT Weeks in Treatment: 88 Verbal / Phone Orders: No Diagnosis Coding ICD-10 Coding Code  Description L89.154 Pressure ulcer of sacral region, stage 4 M48.00 Spinal stenosis, site unspecified R54 Age-related physical debility G89.4 Chronic pain syndrome E78.2 Mixed hyperlipidemia L89.93 Pressure ulcer of unspecified site, stage 3 Wound Cleansing Wound #1 Medial Sacrum o Other: - please cleanse sacral wound with dakins moistened gauze - do not spray dakins on wound Anesthetic (add to Medication List) Wound #1 Medial Sacrum o Topical Lidocaine 4% cream applied to wound bed prior to debridement (In  Clinic Only). Skin Barriers/Peri-Wound Care Wound #1 Medial Sacrum o Skin Prep Primary Wound Dressing Wound #1 Medial Sacrum o Pack wound with: - Dakin's soaked gauze in clinic Secondary Dressing Wound #1 Medial Sacrum o ABD pad - secure with tape use until wound vac is placed back on wound Dressing Change Frequency Wound #1 Medial Sacrum o Change Dressing Monday, Wednesday, Friday Follow-up Appointments Wound #1 Medial Sacrum o Return Appointment in 2 weeks. Off-Loading Wound #1 Medial Sacrum TIMI, PLUFF. ([id number]) o Turn and reposition every 2 hours Additional Orders / Instructions Wound #1 Medial Sacrum o Increase protein intake. Home Health Wound #1 Medial Sacrum o Continue Home Health Visits - Advanced HHRN to start wound vac o Home Health Nurse may visit PRN to address patientos wound care needs. o FACE TO FACE ENCOUNTER: MEDICARE and MEDICAID PATIENTS: I certify that this patient is under my care and that I had a face-to-face encounter that meets the physician face-to-face encounter requirements with this patient on this date. The encounter with the patient was in whole or in part for the following MEDICAL CONDITION: (primary reason for Home Healthcare) MEDICAL NECESSITY: I certify, that based on my findings, NURSING services are a medically necessary home health service. HOME BOUND STATUS: I certify that my clinical findings  support that this patient is homebound (i.e., Due to illness or injury, pt requires aid of supportive devices such as crutches, cane, wheelchairs, walkers, the use of special transportation or the assistance of another person to leave their place of residence. There is a normal inability to leave the home and doing so requires considerable and taxing effort. Other absences are for medical reasons / religious services and are infrequent or of short duration when for other reasons). o If current dressing causes regression in wound condition, may D/C ordered dressing product/s and apply Normal Saline Moist Dressing daily until next Wound Healing Center / Other MD appointment. Notify Wound Healing Center of regression in wound condition at [phone]. o Please direct any NON-WOUND related issues/requests for orders to patient's Primary Care Physician Negative Pressure Wound Therapy Wound #1 Medial Sacrum o Wound VAC settings at 125/130 mmHg continuous pressure. Use BLACK/GREEN foam to wound cavity. Use WHITE foam to fill any tunnel/s and/or undermining. Change VAC dressing 3 X WEEK. Change canister as indicated when full. Nurse may titrate settings and frequency of dressing changes as clinically indicated. - PLEASE CONTINUE ANTIMICROBIAL GAUZE WITH NPWT HHRN to change on Mondays, Wednesdays and Fridays o Home Health Nurse may d/c VAC for s/s of increased infection, significant wound regression, or uncontrolled drainage. Notify [facility] at [phone]. o Number of foam/gauze pieces used in the dressing = Electronic Signature(s) Signed: 04/15/2018 5:14:43 PM By: Curtis Sites Signed: 04/21/2018 10:02:40 AM By: Lenda Kelp PA-C Entered By: Curtis Sites on 04/15/2018 10:47:56 Jessica [name] ([id number]) -------------------------------------------------------------------------------- Problem List Details [name]. Date of Service: 04/15/2018  10:00 AM Medical Record Number: [id number] Patient Account Number: [id number] Date of Birth/Sex: 12/03/1945 (73 y.o. F) Treating RN: Curtis Sites Primary Care Provider: Annita Brod Other Clinician: Referring Provider: Annita Brod Treating Provider/Extender: Linwood Dibbles, HOYT Weeks in Treatment: 46 Active Problems ICD-10 Evaluated Encounter Code Description Active Date Today Diagnosis L89.154 Pressure ulcer of sacral region, stage 4 05/27/2017 No Yes M48.00 Spinal stenosis, site unspecified 05/27/2017 No Yes R54 Age-related physical debility 05/27/2017 No Yes G89.4 Chronic pain syndrome 05/27/2017 No Yes E78.2 Mixed hyperlipidemia 05/27/2017 No Yes L89.93 Pressure ulcer of unspecified site,  stage 3 05/27/2017 No Yes Inactive Problems Resolved Problems Electronic Signature(s) Signed: 04/21/2018 10:02:40 AM By: Lenda Kelp PA-C Entered By: Lenda Kelp on 04/15/2018 10:11:35 Jessica [name] ([id number]) -------------------------------------------------------------------------------- Progress Note Details [name]. Date of Service: 04/15/2018 10:00 AM Medical Record Number: [id number] Patient Account Number: [id number] Date of Birth/Sex: Oct 26, 1945 (73 y.o. F) Treating RN: Curtis Sites Primary Care Provider: Annita Brod Other Clinician: Referring Provider: Annita Brod Treating Provider/Extender: Linwood Dibbles, HOYT Weeks in Treatment: 46 Subjective Chief Complaint Information obtained from Patient she is here for evaluation of a sacral ulcer and bilateral lower extremity ulcers History of Present Illness (HPI) 05/27/17-she is here in initial evaluation for a left-sided sacral stage IV pressure ulcer and bilateral lower extremity, lateral aspect, unstageable pressure ulcers. She is accompanied by her husband and her son, who are her primary caregivers. She is bedbound secondary to spinal stenosis. According to her son and husband she was hospitalized  from 10/5-11/1 with healthcare associated pneumonia and altered mental status. During her hospitalization she was intubated, extubated on 10/27. She was discharged with Foley catheter and follow up with urology. According to her son and spouse she developed the sacral ulcer during hospitalization. Home health has been applying Santyl daily. She does have a low air loss mattress and is repositioned every 2-3 hours per her family's report. According to the son and spouse she had an appointment with urology on 12/18 and during that appointment developed discoloration to her bilateral lower extremities which ultimately developed into unstageable pressure ulcers to the lateral aspects of her bilateral lower extremities. There has been no topical treatment applied to these. She continues to have home health. There is no concerns expressed regarding dietary intake, stating she eats 3 meals a day, eating was provided; she is supplemented with boost with protein. 06/03/17-she is here in follow-up evaluation for sacral and bilateral lower extremity ulcers. Plain film x-ray done today reveals no distraction to the sacrum or coccyx, no visible abnormalities. Home health has ordered the negative pressure wound system but it has not been initiated. We will continue with Hydrofera Blue until initiation of negative pressure wound system and continue with Santyl to bilateral lower extremity ulcers. Follow-up next week 06/10/17-she is here in follow-up evaluation for sacral and bilateral lower extremity ulcers. The wound VAC will be available tomorrow per home health. We will initiate wound VAC therapy to the sacral ulcer 3 times weekly (Thursday, Saturday/Sunday, Tuesday). We will continue with Santyl to the lower extremity ulcers. The patient's son is checking into home health therapy over the weekend for [facility] changes, with the understanding that if VAC changes cannot be performed over the weekend he will need to  change his mother's appointments to Monday, Wednesday or Friday. The sacral ulcer clinically does not appear infected but there has been a change in the amount of drainage acutely, there is no significant amount of devitalized tissue, there is no malodor. Wound culture was obtained to evaluate for occult infection we will hold off on antibiotic therapy until sensitivities are resulted. 06/18/17 on evaluation today patient appears to be doing fairly well in my opinion although this is the first time I have seen this patient she has been previously evaluated by Tacey Ruiz here in our office. She is going to be switching to Fridays to see me due to the Wound VAC schedule being changed on Monday, Wednesday, and Friday. Subsequently she seems to be doing fairly well with the Wound VAC. Her  son who was present during evaluation today states that he somewhat stresses of the Wound VAC in making sure that it was functioning appropriately. With that being said everything seems to be working well he knows what settings on the [facility] itself to look at and ensure that it is functioning properly. Overall the wound appears to be nice and clean there is no need for debridement today. She has no discomfort in her bilateral lower extremity ulcers also appear to be improving based on measurements and what this honest tell me about the overall appearance. 07/02/17 on evaluation today I noted in patients wound bed that was actually an odor that had not previously been noted. Subsequently there was also a small area of bone that was not previously noted during my last evaluation. This did appear to be necrotic and was being somewhat forced out by the body around the region of granulation. She does not have any pain which is good news. Nonetheless the overall appearance of the ulcer is making me concerned for the patient having developed osteomyelitis. Currently she is not been on any antibiotics and the Wound VAC has been doing  fairly well in general. With that being said I do not think we need to continue the Wound VAC if she potentially has a bone infection. At least not until it is properly addressed with antibiotics. Jessica [name], Jessica [name] ([id number]) 07/09/17 on evaluation today patient appears to actually be doing very well in regard to her sacral ulcer compared to last week. There is actually no exposed bone at this point. 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 With that being said still I do believe she needs to have a referral to infectious disease due to the osteomyelitis I am gonna recommend that she continue with the doxycycline based on the culture results which showed Eikenella Corrodens as the organism identified that tetracycline should work for. She does not seem to be having any discomfort whatsoever at this point. 07/16/17 on evaluation today patient actually appears to be doing rather well in regard to her sacral wound. I think that the original wound site actually appears much better than previously noted. With that being said she does have a new superficial injury on the right sacral region which appears to be due to according to the sign transport that occurred for her MRI unfortunately. Fortunately this is not too deep and I do think it can be managed but it was a new area that was not previously noted. Otherwise she has been tolerating the Dakin s soaked gauze packing without complication. 07/30/17 on evaluation today patient presents for reevaluation concerning her sacral wound. She has been tolerating the dressing changes without complication. With that being said her wound is doing so well at this point that I think she may be at the point where we could reinitiate the Wound VAC currently and hopefully see good results from this. I'm very  pleased with how she has responded to the [facility] s soaked gauze packing. 08/13/17 evaluation today patient appears to be doing excellent in regard to her lower extremity ulcer this seems to be cleaning up very nicely. In regard to the sacral ulcer the area of trauma on the right lateral portion of the wound actually appears to be much better than previously noted during the last evaluation. The word has definitely  filled in and the Wound VAC appears to be helpful I do believe. Overall I'm very pleased with how things seem to be progressing at this time. Patient likewise is also happy that things are doing well. 08/27/17 on evaluation today patient presents for follow-up concerning her ongoing issues with her sacral ulcer and lower extremity ulcer. Fortunately she has been tolerating the dressing changes well complication the Wound VAC in general seems to have done very well up to this point. She does not have any evidence of infection which is good news. She does have some dark discoloration in central portion of the wound which is troublesome for the possibility of pressure injury to the site it sounds like her prior air mattress was not functioning properly her son has just bought a new one for her this is doing much better. Otherwise things seem to be progressing nicely. 09/10/17 on evaluation today patient presents for follow-up concerning her sacral wound and lower extremity ulcer. She has been tolerating the switch of her dressing changes to the Dakin s soaked gauze dressing very well in regard to the sacrum. The left lateral lower extremity ulcer appears to have a new area open just distal to the one that we have been treating with Santyl and this is new since her last visit. There does not appear to be any evidence of infection which is good news. With that being said there's a lot of maceration here at the site and I feel this is likely due to the switch and the dressings it appears that due to  the sticking of the dressings it will switch to utilizing a telfa pad over the Santyl. I think this caused a lot of drainage to be collected and situated in the region just under the dressing which has led to this causing which duration of breakdown. Overall there does not appear to be any significant pain which is good news. Of note I did actually have a conversation with the radiologist who is an interventional radiologist with [facility]. 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 With that being said there does not appear to be any rush to get this done according to the interventional radiologist whom I spoke with. Therefore after discussion it was determined that there gonna wait about six months prior to considering the procedure to get time for hopefully the sacral wound to heal in the interim to at least some degree. 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 According to nursing staff these are all improved, the negative pressure wound therapy system was put on hold last week. We will resume negative pressure wound therapy today, continue with Santyl to the lower extremity and she will follow-up next week. 09/24/17 on evaluation today unfortunately the patient's wound appears to be doing significantly worse compared to last week's evaluation and even my valuation the week before. She actually has bone noted on the left wound margin in the region of undermining unfortunately. This was not noted during the last evaluation. I am concerned about the fact that this seems to be worse not better since our last visit with her. My biggest concern is that she's likely developing a worsening osteomyelitis. This was discussed with patient and her son today during the office visit. 05/03/17 on evaluation today patient actually appears to be doing rather well in regard to her  sacral ulcer compared  to last week's evaluation. She has been tolerating the dressing changes without complication. Fortunately even though we're not utilizing the Wound VAC she seems to be making some strides in seeing the wound area overall improved quite dramatically in my pinion in just one weeks time. She also seems to be staying off of this to the point that I do not see any evidence of new injury which is excellent news. Whatever she's been doing in that regard over the past week I would want her to continue. Jessica [name], [name] ([id number]) did also review the results of her bone culture which revealed group B strep as the responsible organism. Again this is what's causing her osteomyelitis she does have infectious disease appointment scheduled for 10/11/17 10/08/1898 valuation today patient appears to be doing better for the most part in regard to the sacral wound. Overall she is showing signs of having granulation of the bone which is good news. There is an area towards the left of the wound where she does seem to be showing some signs of opening this it would be due to additional pressure to the area unfortunately. There does not appear to be any evidence of infection spreading which is good news that do not see any evidence of significant purulent discharge which is also good news. 10/26/17 on evaluation today patient sacral ulcer actually appears to be very healthy and doing well in regard to the overall appearance of the wound. With that being said she does seem to be having some issues at this point in time with some shear/friction injury of the sacrum [facility]. She has been placed on IV antibiotic therapy I will have to go back and find that note for review as I did not have access to that today. Nonetheless I will add the next visit be sure to document the exact antibiotics that she is taking at this  point. Nonetheless I do believe the wound is doing better there's no bone exposure nothing that requires debridement in regard to the sacral wound. Likewise her left lateral lower extremity ulcer also appears to be doing significantly better. In general I feel like things are showing signs of improvement all around. 11/12/17 on evaluation today patient appears to be doing rather well in regard to her sacral wound. In general I do see signs of granulation at this point. Fortunately there does not appear to be any evidence of infection which is good news as well. In general overall happy with the appearance. With that being said I'll be see I would like for this to be progressing more rapidly as with the patient and her son but nonetheless at least things do not appear to be doing worse. Her lower extremity ulcer is doing significantly better. 11/26/17 on evaluation today patient appears to be doing a little better in regard to the sacral wound especially in the peripheral of the wound bed. She actually did have an abrasion on the right gluteal region that's completely resolved to this point. Her lower extremity also appears to be for the most part completely resolved. Overall the sacrum itself seems to be the one thing that is still open and given her trouble. No fevers, chills, nausea, or vomiting noted at this time. She actually has an appointment next week with infectious disease for recheck to see how things are doing. She maybe switch to oral medications at that point. 12/09/17 on evaluation today patient actually  appears to be doing much better in regard to her sacral wound. I do feel like she has made good progress at this time as far as healing is concerned. In fact the wound looks better to me today than it has in quite some time. She is on oral antibiotic therapy and no longer on the IV antibiotics. She did see infectious disease last week. 12/24/17 on evaluation today patient's wounds  actually appear to be doing decently well in regard to the sacral wound in particular. When it comes to her lower extremity ulcers both appear to be completely healed at this point in the one area where she had lived the rash has completely resolved at this time. Overall very pleased with the progress that has been made. Nonetheless the patient seems to be doing well in general. She still does have quite a bit of healing to do in regard to the sacral wound but I feel like she is making progress. 01/07/18 on evaluation today patient actually appears to be doing excellent today regard to her sacral wound. The granular quality in the base of the wound is excellent and shown signs of good improvement there's no evidence of contusion nor deep tissue injury and the periwound actually appears to be doing well. In general I'm very pleased with the overall appearance. 02/04/18 on evaluation today patient's wound actually appears to be doing decently well as far as the granulation is concerned. She has been tolerating the dressing changes without complication. Right now we are still using the Wound VAC. Nonetheless overall there apparently is some cater to the wound and this is concerning simply for the fact that she could be developing a soft tissue infection again. She's had this happen in the past and at that time responded very well to the antibiotics. Nonetheless I don't think I would likely put her on anything prophylactically but rather wait for the results of the culture to return. 02/18/18 on evaluation today patient actually appears to be doing fairly well. She has been tolerating the dressing changes. And the Wound VAC seems to be doing well. Overall I'm very pleased with how things have improved over the past couple weeks since I last saw her. Fortunately there does not appear to be any evidence of overt infection at this time which is good news. She has been taking the antibiotics without  complication. Specifically that is the Bactrim DS which was prescribed on 02/08/18. 03/18/18 on evaluation today patient's wound actually does have quite a bit of odor noted compared to previous. With that Jessica [name], Jessica [name]. ([id number]) being said the wound itself overall does not appear to be too significant or worse. There is no event noted in the wound bed which is good news. With that being said the patient has been worried as the home health nurse stated that she thought she saw bone in the base of the wound. Nonetheless there's a little bit of bruising along the left lateral border of the wound bed which again does not appear to be terrible we have noticed this before but other than this the majority the wound bed appears to be doing excellent in my pinion. I am wondering if there may be some bacterial colonization. This could be leading to some of the odor that we are noting with the Wound VAC. 04/01/18 on evaluation today patient appears to actually be doing well in regard to the overall appearance of the wound itself. She also does not have any odor at  this point in regard to the wound surface and I do believe the Bacons is extremely helpful in this regard. With that being said she does have a little bit of deep tissue injury on the medial aspect of the wound. Again last time it was more lateral. The lateral seems to have improved quite well. Nonetheless she also notes by way of her son that this has been very macerated in the past several weeks since I last saw her. Though things overall seem to be doing better I still think that the Wound VAC may help with managing her fluid better I'm wondering if we can attempt a gauze Wound VAC my hope is that this will be able to manage the moisture control as well as continuing with managing the odor of the wound and bacterial load since the Dakin s has done excellent in helping in that regard. 04/15/17 on evaluation today patient actually appears to  be doing much better in regard to the overall appearance of her sacral wound. I do believe that the gauze Vac has been of benefit for her. Overall she is tolerating this without complication which is also good news. There is no sign of injury to the sacral region at this point. Patient History Information obtained from Patient. Family History No family history of Cancer, Diabetes, Heart Disease. Social History Never smoker, Marital Status - Married. Review of Systems (ROS) Constitutional Symptoms (General Health) Denies complaints or symptoms of Fever, Chills, Marked Weight Change. Respiratory The patient has no complaints or symptoms. Cardiovascular The patient has no complaints or symptoms. Psychiatric The patient has no complaints or symptoms. Objective Constitutional Well-nourished and well-hydrated in no acute distress. Vitals Time Taken: 10:11 AM, Height: 70 in, Temperature: 98.2 F, Pulse: 75 bpm, Respiratory Rate: 18 breaths/min, Blood Pressure: 140/58 mmHg. Respiratory normal breathing without difficulty. clear to auscultation bilaterally. Jessica BradyOVERMAN, Nylia W. ([id number]) Cardiovascular regular rate and rhythm with normal S1, S2. Psychiatric this patient is able to make decisions and demonstrates good insight into disease process. Alert and Oriented x 3. pleasant and cooperative. General Notes: Currently my suggestion is gonna be based on what I see visually that we continue with the gauze vac at this point. Patient son is in agreement with this plan especially since everything seems to be looking so well. No sharp debridement was necessary today. Integumentary (Hair, Skin) Wound #1 status is Open. Original cause of wound was Pressure Injury. The wound is located on the Medial Sacrum. The wound measures 7.9cm length x 5cm width x 2.4cm depth; 31.023cm^2 area and 74.456cm^3 volume. There is muscle, Fat Layer (Subcutaneous Tissue) Exposed, and fascia exposed. There is no  tunneling noted, however, there is undermining starting at 8:00 and ending at 11:00 with a maximum distance of 2.1cm. There is a large amount of sanguinous drainage noted. Foul odor after cleansing was noted. The wound margin is distinct with the outline attached to the wound base. There is large (67-100%) red, pink, hyper - granulation within the wound bed. There is a small (1-33%) amount of necrotic tissue within the wound bed including Eschar and Adherent Slough. The periwound skin appearance exhibited: Induration, Scarring, Maceration, Rubor, Erythema. The periwound skin appearance did not exhibit: Callus, Crepitus, Excoriation, Rash, Dry/Scaly, Atrophie Blanche, Cyanosis, Ecchymosis, Hemosiderin Staining, Mottled, Pallor. The surrounding wound skin color is noted with erythema which is circumferential. Periwound temperature was noted as No Abnormality. The periwound has tenderness on palpation. Assessment Active Problems ICD-10 Pressure ulcer of sacral region, stage 4  Spinal stenosis, site unspecified Age-related physical debility Chronic pain syndrome Mixed hyperlipidemia Pressure ulcer of unspecified site, stage 3 Plan Wound Cleansing: Wound #1 Medial Sacrum: Other: - please cleanse sacral wound with dakins moistened gauze - do not spray dakins on wound Anesthetic (add to Medication List): Wound #1 Medial Sacrum: Topical Lidocaine 4% cream applied to wound bed prior to debridement (In Clinic Only). Skin Barriers/Peri-Wound Care: Wound #1 Medial Sacrum: Skin Prep Primary Wound Dressing: Jessica [name], Jessica [name] ([id number]) Wound #1 Medial Sacrum: Pack wound with: - Dakin's soaked gauze in clinic Secondary Dressing: Wound #1 Medial Sacrum: ABD pad - secure with tape use until wound vac is placed back on wound Dressing Change Frequency: Wound #1 Medial Sacrum: Change Dressing Monday, Wednesday, Friday Follow-up Appointments: Wound #1 Medial Sacrum: Return Appointment in 2  weeks. Off-Loading: Wound #1 Medial Sacrum: Turn and reposition every 2 hours Additional Orders / Instructions: Wound #1 Medial Sacrum: Increase protein intake. Home Health: Wound #1 Medial Sacrum: Continue Home Health Visits - Advanced HHRN to start wound vac Home Health Nurse may visit PRN to address patient s wound care needs. FACE TO FACE ENCOUNTER: MEDICARE and MEDICAID PATIENTS: I certify that this patient is under my care and that I had a face-to-face encounter that meets the physician face-to-face encounter requirements with this patient on this date. The encounter with the patient was in whole or in part for the following MEDICAL CONDITION: (primary reason for Home Healthcare) MEDICAL NECESSITY: I certify, that based on my findings, NURSING services are a medically necessary home health service. HOME BOUND STATUS: I certify that my clinical findings support that this patient is homebound (i.e., Due to illness or injury, pt requires aid of supportive devices such as crutches, cane, wheelchairs, walkers, the use of special transportation or the assistance of another person to leave their place of residence. There is a normal inability to leave the home and doing so requires considerable and taxing effort. Other absences are for medical reasons / religious services and are infrequent or of short duration when for other reasons). If current dressing causes regression in wound condition, may D/C ordered dressing product/s and apply Normal Saline Moist Dressing daily until next Wound Healing Center / Other MD appointment. Notify Wound Healing Center of regression in wound condition at [phone]. Please direct any NON-WOUND related issues/requests for orders to patient's Primary Care Physician Negative Pressure Wound Therapy: Wound #1 Medial Sacrum: Wound VAC settings at 125/130 mmHg continuous pressure. Use BLACK/GREEN foam to wound cavity. Use WHITE foam to fill any tunnel/s and/or  undermining. Change VAC dressing 3 X WEEK. Change canister as indicated when full. Nurse may titrate settings and frequency of dressing changes as clinically indicated. - PLEASE CONTINUE ANTIMICROBIAL GAUZE WITH NPWT HHRN to change on Mondays, Wednesdays and Fridays Home Health Nurse may d/c VAC for s/s of increased infection, significant wound regression, or uncontrolled drainage. Notify Wound Healing Center at [phone]. Number of foam/gauze pieces used in the dressing = I'm gonna recommend that we continue with the above wound care measures for the next week. The patient is in agreement with plan. Subsequently we will see were things stand at follow-up. Please see above for specific wound care orders. We will see patient for re-evaluation in 2 week(s) here in the clinic. If anything worsens or changes patient will contact our office for additional recommendations. Electronic Signature(s) Signed: 04/21/2018 10:02:40 AM By: Lenda Kelp PA-C Entered By: Lenda Kelp on 04/17/2018 08:48:18 Jessica [name] ([id number])  Jessica [name], Jessica [name] ([id number]) -------------------------------------------------------------------------------- ROS/PFSH Details [name], Jessica [name]. Date of Service: 04/15/2018 10:00 AM Medical Record Number: [id number] Patient Account Number: [id number] Date of Birth/Sex: 1946-01-03 (73 y.o. F) Treating RN: Curtis Sites Primary Care Provider: Annita Brod Other Clinician: Referring Provider: Annita Brod Treating Provider/Extender: STONE III, HOYT Weeks in Treatment: 46 Information Obtained From Patient Wound History Constitutional Symptoms (General Health) Complaints and Symptoms: Negative for: Fever; Chills; Marked Weight Change Respiratory Complaints and Symptoms: No Complaints or Symptoms Cardiovascular Complaints and Symptoms: No Complaints or Symptoms Psychiatric Complaints and Symptoms: No Complaints or  Symptoms Immunizations Pneumococcal Vaccine: Received Pneumococcal Vaccination: No Implantable Devices Family and Social History Cancer: No; Diabetes: No; Heart Disease: No; Never smoker; Marital Status - Married Physician Affirmation I have reviewed and agree with the above information. Electronic Signature(s) Signed: 04/18/2018 5:30:25 PM By: Curtis Sites Signed: 04/21/2018 10:02:40 AM By: Lenda Kelp PA-C Entered By: Lenda Kelp on 04/17/2018 08:47:30 Jessica [name] ([id number]) -------------------------------------------------------------------------------- SuperBill Details [name]. Date of Service: 04/15/2018 Medical Record Number: [id number] Patient Account Number: [id number] Date of Birth/Sex: [birth year] (73 y.o. 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 Pressure ulcer of sacral region, stage 4 M48.00 Spinal stenosis, site unspecified R54 Age-related physical debility G89.4 Chronic pain syndrome E78.2 Mixed hyperlipidemia L89.93 Pressure ulcer of unspecified site, stage 3 Facility Procedures CPT4 Code: 62952841 Description: 99213 - WOUND CARE VISIT-LEV 3 EST PT Modifier: Quantity: 1 Physician Procedures CPT4 Code: 3244010 Description: 99214 - WC PHYS LEVEL 4 - EST PT ICD-10 Diagnosis Description 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

## 2018-04-26 ENCOUNTER — Ambulatory Visit: Payer: Self-pay | Admitting: Urology

## 2018-04-29 ENCOUNTER — Encounter: Payer: Medicare Other | Admitting: Physician Assistant

## 2018-04-29 DIAGNOSIS — L89154 Pressure ulcer of sacral region, stage 4: Secondary | ICD-10-CM | POA: Diagnosis not present

## 2018-05-05 ENCOUNTER — Ambulatory Visit: Payer: Self-pay | Admitting: Urology

## 2018-05-05 NOTE — Progress Notes (Signed)
Jessica BradyOVERMAN, Tamirra W. ([id number]) Visit Report for 04/29/2018 Arrival Information Details Patient Name: Jessica BradyOVERMAN, Ikran W. Date of Service: 04/29/2018 11:00 AM Medical Record Number: [id number] Patient Account Number: [id number] Date of Birth/Sex: 01/15/1946 (73 y.o. F) Treating RN: Curtis Sitesorthy, Joanna Primary Care Wash Nienhaus: Annita BrodASENSO, PHILIP Other Clinician: Referring Jlee Harkless: Annita BrodASENSO, PHILIP Treating Adhira Jamil/Extender: Linwood DibblesSTONE III, HOYT Weeks in Treatment: 48 Visit Information History Since Last Visit Added or deleted any medications: No Patient Arrived: Stretcher Any new allergies or adverse reactions: No Arrival Time: 11:10 Had a fall or experienced change in No Accompanied By: son activities of daily living that may affect Transfer Assistance: Manual risk of falls: Patient Identification Verified: Yes Signs or symptoms of abuse/neglect since last visito No Secondary Verification Process Yes Hospitalized since last visit: No Completed: Implantable device outside of the clinic excluding No Patient Has Alerts: Yes cellular tissue based products placed in the center Patient Alerts: ALLERGIC TO since last visit: ZINC Has Dressing in Place as Prescribed: Yes Pain Present Now: No Electronic Signature(s) Signed: 04/29/2018 3:49:07 PM By: Dayton MartesWallace, RCP,RRT,CHT, Sallie RCP, RRT, CHT Entered By: Dayton MartesWallace, RCP,RRT,CHT, Sallie on 04/29/2018 11:11:46 Jessica BradyVERMAN, Persephone W. ([id number]) -------------------------------------------------------------------------------- Clinic Level of Care Assessment Details Patient Name: Jessica BradyVERMAN, Ashiya W. Date of Service: 04/29/2018 11:00 AM Medical Record Number: [id number] Patient Account Number: [id number] Date of Birth/Sex: 01/15/1946 (73 y.o. F) Treating RN: Curtis Sitesorthy, Joanna Primary Care Ottie Tillery: Annita BrodASENSO, PHILIP Other Clinician: Referring Lynnetta Tom: Annita BrodASENSO, PHILIP Treating Joshalyn Ancheta/Extender: STONE III, HOYT Weeks in Treatment: 48 Clinic Level of Care Assessment  Items TOOL 4 Quantity Score []  - Use when only an EandM is performed on FOLLOW-UP visit 0 ASSESSMENTS - Nursing Assessment / Reassessment X - Reassessment of Co-morbidities (includes updates in patient status) 1 10 X- 1 5 Reassessment of Adherence to Treatment Plan ASSESSMENTS - Wound and Skin Assessment / Reassessment X - Simple Wound Assessment / Reassessment - one wound 1 5 []  - 0 Complex Wound Assessment / Reassessment - multiple wounds []  - 0 Dermatologic / Skin Assessment (not related to wound area) ASSESSMENTS - Focused Assessment []  - Circumferential Edema Measurements - multi extremities 0 []  - 0 Nutritional Assessment / Counseling / Intervention []  - 0 Lower Extremity Assessment (monofilament, tuning fork, pulses) []  - 0 Peripheral Arterial Disease Assessment (using hand held doppler) ASSESSMENTS - Ostomy and/or Continence Assessment and Care []  - Incontinence Assessment and Management 0 []  - 0 Ostomy Care Assessment and Management (repouching, etc.) PROCESS - Coordination of Care X - Simple Patient / Family Education for ongoing care 1 15 []  - 0 Complex (extensive) Patient / Family Education for ongoing care X- 1 10 Staff obtains ChiropractorConsents, Records, Test Results / Process Orders []  - 0 Staff telephones HHA, Nursing Homes / Clarify orders / etc []  - 0 Routine Transfer to another Facility (non-emergent condition) []  - 0 Routine Hospital Admission (non-emergent condition) []  - 0 New Admissions / Manufacturing engineernsurance Authorizations / Ordering NPWT, Apligraf, etc. []  - 0 Emergency Hospital Admission (emergent condition) X- 1 10 Simple Discharge Coordination Jessica BradyOVERMAN, Kelsi W. ([id number]) []  - 0 Complex (extensive) Discharge Coordination PROCESS - Special Needs []  - Pediatric / Minor Patient Management 0 []  - 0 Isolation Patient Management []  - 0 Hearing / Language / Visual special needs []  - 0 Assessment of Community assistance (transportation, D/C planning, etc.) []   - 0 Additional assistance / Altered mentation []  - 0 Support Surface(s) Assessment (bed, cushion, seat, etc.) INTERVENTIONS - Wound Cleansing / Measurement X - Simple Wound Cleansing - one wound 1 5 []  -  0 Complex Wound Cleansing - multiple wounds X- 1 5 Wound Imaging (photographs - any number of wounds) []  - 0 Wound Tracing (instead of photographs) X- 1 5 Simple Wound Measurement - one wound []  - 0 Complex Wound Measurement - multiple wounds INTERVENTIONS - Wound Dressings X - Small Wound Dressing one or multiple wounds 1 10 []  - 0 Medium Wound Dressing one or multiple wounds []  - 0 Large Wound Dressing one or multiple wounds X- 1 5 Application of Medications - topical []  - 0 Application of Medications - injection INTERVENTIONS - Miscellaneous []  - External ear exam 0 []  - 0 Specimen Collection (cultures, biopsies, blood, body fluids, etc.) []  - 0 Specimen(s) / Culture(s) sent or taken to Lab for analysis []  - 0 Patient Transfer (multiple staff / Nurse, adult / Similar devices) []  - 0 Simple Staple / Suture removal (25 or less) []  - 0 Complex Staple / Suture removal (26 or more) []  - 0 Hypo / Hyperglycemic Management (close monitor of Blood Glucose) []  - 0 Ankle / Brachial Index (ABI) - do not check if billed separately X- 1 5 Vital Signs Kolasinski, Robbyn W. ([id number]) Has the patient been seen at the hospital within the last three years: Yes Total Score: 90 Level Of Care: New/Established - Level 3 Electronic Signature(s) Signed: 04/29/2018 5:32:05 PM By: Curtis Sites Entered By: Curtis Sites on 04/29/2018 12:05:24 Jessica [name] ([id number]) -------------------------------------------------------------------------------- Encounter Discharge Information Details [name]. Date of Service: 04/29/2018 11:00 AM Medical Record Number: [id number] Patient Account Number: [id number] Date of Birth/Sex: 08-12-1945 (73 y.o. F) Treating RN:  Curtis Sites Primary Care Pegeen Stiger: Annita Brod Other Clinician: Referring Pat Elicker: Annita Brod Treating Eugena Rhue/Extender: Linwood Dibbles, HOYT Weeks in Treatment: 48 Encounter Discharge Information Items Discharge Condition: Stable Ambulatory Status: Stretcher Discharge Destination: Home Transportation: Ambulance Accompanied By: son Schedule Follow-up Appointment: Yes Clinical Summary of Care: Electronic Signature(s) Signed: 04/29/2018 5:32:05 PM By: Curtis Sites Entered By: Curtis Sites on 04/29/2018 12:05:59 Jessica [name] ([id number]) -------------------------------------------------------------------------------- Lower Extremity Assessment Details [name]. Date of Service: 04/29/2018 11:00 AM Medical Record Number: [id number] Patient Account Number: [id number] Date of Birth/Sex: 07-Feb-1946 (73 y.o. F) Treating RN: Rema Jasmine Primary Care Lasandra Batley: Annita Brod Other Clinician: Referring Shriyans Kuenzi: Annita Brod Treating Janeli Lewison/Extender: Linwood Dibbles, HOYT Weeks in Treatment: 48 Electronic Signature(s) Signed: 04/29/2018 11:50:24 AM By: Rema Jasmine Entered By: Rema Jasmine on 04/29/2018 11:17:39 Jessica [name] ([id number]) -------------------------------------------------------------------------------- Multi Wound Chart Details [name]. Date of Service: 04/29/2018 11:00 AM Medical Record Number: [id number] Patient Account Number: [id number] Date of Birth/Sex: 1946-02-07 (73 y.o. F) Treating RN: Curtis Sites Primary Care Emberlie Gotcher: Annita Brod Other Clinician: Referring Tanasia Budzinski: Annita Brod Treating Keyon Winnick/Extender: STONE III, HOYT Weeks in Treatment: 48 Vital Signs Height(in): 70 Pulse(bpm): 71 Weight(lbs): Blood Pressure(mmHg): 132/55 Body Mass Index(BMI): Temperature(F): 98.1 Respiratory Rate 18 (breaths/min): Photos: [N/A:N/A] Wound Location: Sacrum - Medial N/A N/A Wounding Event: Pressure  Injury N/A N/A Primary Etiology: Pressure Ulcer N/A N/A Date Acquired: 01/15/2017 N/A N/A Weeks of Treatment: 48 N/A N/A Wound Status: Open N/A N/A Measurements L x W x D 7.9x3.1x2 N/A N/A (cm) Area (cm) : 19.234 N/A N/A Volume (cm) : 38.469 N/A N/A % Reduction in Area: 47.80% N/A N/A % Reduction in Volume: -4.40% N/A N/A Starting Position 1 10 (o'clock): Ending Position 1 12 (o'clock): Maximum Distance 1 (cm): 2.5 Undermining: Yes N/A N/A Classification: Category/Stage IV N/A N/A Exudate Amount: Medium N/A N/A Exudate Type: Sanguinous  N/A N/A Exudate Color: red N/A N/A Foul Odor After Cleansing: Yes N/A N/A Odor Anticipated Due to No N/A N/A Product Use: Wound Margin: Distinct, outline attached N/A N/A Granulation Amount: Small (1-33%) N/A N/A Granulation Quality: Red, Pink, Hyper-granulation N/A N/A Necrotic Amount: Large (67-100%) N/A N/A Jessica BradyOVERMAN, Emmelia W. ([id number]) Necrotic Tissue: Eschar, Adherent Slough N/A N/A Exposed Structures: Fascia: Yes N/A N/A Fat Layer (Subcutaneous Tissue) Exposed: Yes Muscle: Yes Tendon: No Joint: No Bone: No Epithelialization: None N/A N/A Periwound Skin Texture: Induration: Yes N/A N/A Scarring: Yes Excoriation: No Callus: No Crepitus: No Rash: No Periwound Skin Moisture: Maceration: Yes N/A N/A Dry/Scaly: No Periwound Skin Color: Erythema: Yes N/A N/A Rubor: Yes Atrophie Blanche: No Cyanosis: No Ecchymosis: No Hemosiderin Staining: No Mottled: No Pallor: No Erythema Location: Circumferential N/A N/A Temperature: No Abnormality N/A N/A Tenderness on Palpation: Yes N/A N/A Wound Preparation: Ulcer Cleansing: N/A N/A Rinsed/Irrigated with Saline Topical Anesthetic Applied: Other: lidocaine 4% Treatment Notes Electronic Signature(s) Signed: 04/29/2018 5:32:05 PM By: Curtis Sitesorthy, Joanna Entered By: Curtis Sitesorthy, Joanna on 04/29/2018 11:48:35 Jessica BradyVERMAN, Yisel W.  ([id number]) -------------------------------------------------------------------------------- Multi-Disciplinary Care Plan Details Patient Name: Jessica BradyVERMAN, Carlyne W. Date of Service: 04/29/2018 11:00 AM Medical Record Number: [id number] Patient Account Number: [id number] Date of Birth/Sex: [birth year] (73 y.o. F) Treating RN: Curtis Sitesorthy, Joanna Primary Care Leialoha Hanna: Annita BrodASENSO, PHILIP Other Clinician: Referring Jamaica Inthavong: Annita BrodASENSO, PHILIP Treating Meilani Edmundson/Extender: STONE III, HOYT Weeks in Treatment: 48 Active Inactive Orientation to the Wound Care Program Nursing Diagnoses: Knowledge deficit related to the wound healing center program Goals: Patient/caregiver will verbalize understanding of the Wound Healing Center Program Date Initiated: 06/03/2017 Target Resolution Date: 06/17/2017 Goal Status: Active Interventions: Provide education on orientation to the wound center Notes: Pressure Nursing Diagnoses: Knowledge deficit related to causes and risk factors for pressure ulcer development Knowledge deficit related to management of pressures ulcers Potential for impaired tissue integrity related to pressure, friction, moisture, and shear Goals: Patient will remain free from development of additional pressure ulcers Date Initiated: 06/03/2017 Target Resolution Date: 06/17/2017 Goal Status: Active Patient will remain free of pressure ulcers Date Initiated: 06/03/2017 Target Resolution Date: 06/17/2017 Goal Status: Active Patient/caregiver will verbalize risk factors for pressure ulcer development Date Initiated: 06/03/2017 Target Resolution Date: 06/17/2017 Goal Status: Active Interventions: Assess: immobility, friction, shearing, incontinence upon admission and as needed Assess potential for pressure ulcer upon admission and as needed Notes: Wound/Skin Impairment Jessica BradyOVERMAN, Oliwia W. ([id number]) Nursing Diagnoses: Impaired tissue integrity Goals: Patient/caregiver will verbalize understanding of  skin care regimen Date Initiated: 06/03/2017 Target Resolution Date: 06/17/2017 Goal Status: Active Ulcer/skin breakdown will have a volume reduction of 30% by week 4 Date Initiated: 06/03/2017 Target Resolution Date: 06/17/2017 Goal Status: Active Interventions: Assess ulceration(s) every visit Treatment Activities: Patient referred to home care : 06/03/2017 Skin care regimen initiated : 06/03/2017 Notes: Electronic Signature(s) Signed: 04/29/2018 5:32:05 PM By: Curtis Sitesorthy, Joanna Entered By: Curtis Sitesorthy, Joanna on 04/29/2018 11:48:25 Jessica BradyVERMAN, Siyah W. ([id number]) -------------------------------------------------------------------------------- Pain Assessment Details Patient Name: Jessica BradyVERMAN, Elysia W. Date of Service: 04/29/2018 11:00 AM Medical Record Number: [id number] Patient Account Number: [id number] Date of Birth/Sex: [birth year] (73 y.o. F) Treating RN: Curtis Sitesorthy, Joanna Primary Care Aime Meloche: Annita BrodASENSO, PHILIP Other Clinician: Referring Ladawn Boullion: Annita BrodASENSO, PHILIP Treating Makinsley Schiavi/Extender: STONE III, HOYT Weeks in Treatment: 48 Active Problems Location of Pain Severity and Description of Pain Patient Has Paino No Site Locations Pain Management and Medication Current Pain Management: Electronic Signature(s) Signed: 04/29/2018 3:49:07 PM By: Sallee ProvencalWallace, RCP,RRT,CHT, Sallie RCP, RRT, CHT Signed: 04/29/2018 5:32:05 PM By: Curtis Sitesorthy, Joanna Entered By: Earlene PlaterWallace,  RCP,RRT,CHT, Sallie on 04/29/2018 11:11:57 ANTONIA, STANSBERRY ([id number]) -------------------------------------------------------------------------------- Patient/Caregiver Education Details Patient Name: IDRIS, KARPINSKI. Date of Service: 04/29/2018 11:00 AM Medical Record Number: [id number] Patient Account Number: [id number] Date of Birth/Gender: 17-Nov-1945 (73 y.o. F) Treating RN: Curtis Sites Primary Care Physician: Annita Brod Other Clinician: Referring Physician: Annita Brod Treating Physician/Extender: Linwood Dibbles, HOYT Weeks in  Treatment: 32 Education Assessment Education Provided To: Patient Education Topics Provided Wound/Skin Impairment: Handouts: Other: wound care as ordered Methods: Explain/Verbal Responses: State content correctly Electronic Signature(s) Signed: 04/29/2018 5:32:05 PM By: Curtis Sites Entered By: Curtis Sites on 04/29/2018 12:06:21 Jessica [name] ([id number]) -------------------------------------------------------------------------------- Wound Assessment Details [name]. Date of Service: 04/29/2018 11:00 AM Medical Record Number: [id number] Patient Account Number: [id number] Date of Birth/Sex: Jul 29, 1945 (73 y.o. F) Treating RN: Rema Jasmine Primary Care Mackayla Mullins: Annita Brod Other Clinician: Referring Ellanie Oppedisano: Annita Brod Treating Donnalyn Juran/Extender: STONE III, HOYT Weeks in Treatment: 48 Wound Status Wound Number: 1 Primary Etiology: Pressure Ulcer Wound Location: Sacrum - Medial Wound Status: Open Wounding Event: Pressure Injury Date Acquired: 01/15/2017 Weeks Of Treatment: 48 Clustered Wound: No Photos Photo Uploaded By: Rema Jasmine on 04/29/2018 11:34:33 Wound Measurements Length: (cm) 7.9 % Reductio Width: (cm) 3.1 % Reductio Depth: (cm) 2 Epithelial Area: (cm) 19.234 Tunneling Volume: (cm) 38.469 Undermini Startin Ending Maximum n in Area: 47.8% n in Volume: -4.4% ization: None : No ng: Yes g Position (o'clock): 10 Position (o'clock): 12 Distance: (cm) 2.5 Wound Description Classification: Category/Stage IV Foul Odor Wound Margin: Distinct, outline attached Due to Pro Exudate Amount: Medium Slough/Fib Exudate Type: Sanguinous Exudate Color: red After Cleansing: Yes duct Use: No rino Yes Wound Bed Granulation Amount: Small (1-33%) Exposed Structure Granulation Quality: Red, Pink, Hyper-granulation Fascia Exposed: Yes Necrotic Amount: Large (67-100%) Fat Layer (Subcutaneous Tissue) Exposed: Yes Necrotic Quality:  Eschar, Adherent Slough Tendon Exposed: No Muscle Exposed: Yes LILLEE, MANHEIM. ([id number]) Necrosis of Muscle: No Joint Exposed: No Bone Exposed: No Periwound Skin Texture Texture Color No Abnormalities Noted: No No Abnormalities Noted: No Callus: No Atrophie Blanche: No Crepitus: No Cyanosis: No Excoriation: No Ecchymosis: No Induration: Yes Erythema: Yes Rash: No Erythema Location: Circumferential Scarring: Yes Hemosiderin Staining: No Mottled: No Moisture Pallor: No No Abnormalities Noted: No Rubor: Yes Dry / Scaly: No Maceration: Yes Temperature / Pain Temperature: No Abnormality Tenderness on Palpation: Yes Wound Preparation Ulcer Cleansing: Rinsed/Irrigated with Saline Topical Anesthetic Applied: Other: lidocaine 4%, Treatment Notes Wound #1 (Medial Sacrum) Notes HHRN to replace wound vac once gauze is available for use under NPWT, Dakin's soaked gauze with ABD pad on top secured with tape Electronic Signature(s) Signed: 04/29/2018 11:50:24 AM By: Rema Jasmine Entered By: Rema Jasmine on 04/29/2018 11:30:59 Jessica [name] ([id number]) -------------------------------------------------------------------------------- Vitals Details [name]. Date of Service: 04/29/2018 11:00 AM Medical Record Number: [id number] Patient Account Number: [id number] Date of Birth/Sex: May 15, 1945 (73 y.o. F) Treating RN: Curtis Sites Primary Care Maricela Kawahara: Annita Brod Other Clinician: Referring Haden Suder: Annita Brod Treating Silvanna Ohmer/Extender: STONE III, HOYT Weeks in Treatment: 48 Vital Signs Time Taken: 11:12 Temperature (F): 98.1 Height (in): 70 Pulse (bpm): 71 Respiratory Rate (breaths/min): 18 Blood Pressure (mmHg): 132/55 Reference Range: 80 - 120 mg / dl Electronic Signature(s) Signed: 04/29/2018 3:49:07 PM By: Dayton Martes RCP, RRT, CHT Entered By: Dayton Martes on 04/29/2018 11:15:16

## 2018-05-06 NOTE — Progress Notes (Signed)
GLADA, WICKSTROM ([id number]) Visit Report for 04/29/2018 Chief Complaint Document Details [name], Jessica [name]. Date of Service: 04/29/2018 11:00 AM Medical Record Number: [id number] Patient Account Number: [id number] Date of Birth/Sex: 10/22/1945 (73 y.o. F) Treating RN: Curtis Sites Primary Care Provider: Annita Brod Other Clinician: Referring Provider: Annita Brod Treating Provider/Extender: Linwood Dibbles, HOYT Weeks in Treatment: 48 Information Obtained from: Patient Chief Complaint she is here for evaluation of a sacral ulcer and bilateral lower extremity ulcers Electronic Signature(s) Signed: 05/05/2018 9:23:56 AM By: Lenda Kelp PA-C Entered By: Lenda Kelp on 04/29/2018 11:13:42 Jessica [name] ([id number]) -------------------------------------------------------------------------------- HPI Details [name]. Date of Service: 04/29/2018 11:00 AM Medical Record Number: [id number] Patient Account Number: [id number] Date of Birth/Sex: [birth year] (73 y.o. F) Treating RN: Curtis Sites Primary Care Provider: Annita Brod Other Clinician: Referring Provider: Annita Brod Treating Provider/Extender: Linwood Dibbles, HOYT Weeks in Treatment: 48 History of Present Illness HPI Description: 05/27/17-she is here in initial evaluation for a left-sided sacral stage IV pressure ulcer and bilateral lower extremity, lateral aspect, unstageable pressure ulcers. She is accompanied by her husband and her son, who are her primary caregivers. She is bedbound secondary to spinal stenosis. According to her son and husband she was hospitalized from 10/5-11/1 with healthcare associated pneumonia and altered mental status. During her hospitalization she was intubated, extubated on 10/27. She was discharged with Foley catheter and follow up with urology. According to her son and spouse she developed the sacral ulcer during hospitalization. Home health has  been applying Santyl daily. She does have a low air loss mattress and is repositioned every 2-3 hours per her family's report. According to the son and spouse she had an appointment with urology on 12/18 and during that appointment developed discoloration to her bilateral lower extremities which ultimately developed into unstageable pressure ulcers to the lateral aspects of her bilateral lower extremities. There has been no topical treatment applied to these. She continues to have home health. There is no concerns expressed regarding dietary intake, stating she eats 3 meals a day, eating was provided; she is supplemented with boost with protein. 06/03/17-she is here in follow-up evaluation for sacral and bilateral lower extremity ulcers. Plain film x-ray done today reveals no distraction to the sacrum or coccyx, no visible abnormalities. Home health has ordered the negative pressure wound system but it has not been initiated. We will continue with Hydrofera Blue until initiation of negative pressure wound system and continue with Santyl to bilateral lower extremity ulcers. Follow-up next week 06/10/17-she is here in follow-up evaluation for sacral and bilateral lower extremity ulcers. The wound VAC will be available tomorrow per home health. We will initiate wound VAC therapy to the sacral ulcer 3 times weekly (Thursday, Saturday/Sunday, Tuesday). We will continue with Santyl to the lower extremity ulcers. The patient's son is checking into home health therapy over the weekend for [facility] changes, with the understanding that if VAC changes cannot be performed over the weekend he will need to change his mother's appointments to Monday, Wednesday or Friday. The sacral ulcer clinically does not appear infected but there has been a change in the amount of drainage acutely, there is no significant amount of devitalized tissue, there is no malodor. Wound culture was obtained to evaluate for occult infection we  will hold off on antibiotic therapy until sensitivities are resulted. 06/18/17 on evaluation today patient appears to be doing fairly well in my opinion although this is the first time I  have seen this patient she has been previously evaluated by Tacey RuizLeah here in our office. She is going to be switching to Fridays to see me due to the Wound VAC schedule being changed on Monday, Wednesday, and Friday. Subsequently she seems to be doing fairly well with the Wound VAC. Her son who was present during evaluation today states that he somewhat stresses of the Wound VAC in making sure that it was functioning appropriately. With that being said everything seems to be working well he knows what settings on the [facility] HVAC itself to look at and ensure that it is functioning properly. Overall the wound appears to be nice and clean there is no need for debridement today. She has no discomfort in her bilateral lower extremity ulcers also appear to be improving based on measurements and what this honest tell me about the overall appearance. 07/02/17 on evaluation today I noted in patients wound bed that was actually an odor that had not previously been noted. Subsequently there was also a small area of bone that was not previously noted during my last evaluation. This did appear to be necrotic and was being somewhat forced out by the body around the region of granulation. She does not have any pain which is good news. Nonetheless the overall appearance of the ulcer is making me concerned for the patient having developed osteomyelitis. Currently she is not been on any antibiotics and the Wound VAC has been doing fairly well in general. With that being said I do not think we need to continue the Wound VAC if she potentially has a bone infection. At least not until it is properly addressed with antibiotics. 07/09/17 on evaluation today patient appears to actually be doing very well in regard to her sacral ulcer compared to last  week. There is actually no exposed bone at this point. 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 With that being said still I do believe she needs to have a referral to infectious disease due to the osteomyelitis I am gonna recommend that she continue with the doxycycline based on the culture results which showed Eikenella Corrodens as the Jessica BradyOVERMAN, [name] ([id number]) organism identified that tetracycline should work for. She does not seem to be having any discomfort whatsoever at this point. 07/16/17 on evaluation today patient actually appears to be doing rather well in regard to her sacral wound. I think that the original wound site actually appears much better than previously noted. With that being said she does have a new superficial injury on the right sacral region which appears to be due to according to the sign transport that occurred for her MRI unfortunately. Fortunately this is not too deep and I do think it can be managed but it was a new area that was not previously noted. Otherwise she has been tolerating the Dakinos soaked gauze packing without complication. 07/30/17 on evaluation today patient presents for reevaluation concerning her sacral wound. She has been tolerating the dressing changes without complication. With that being said her wound is doing so well at this point that I think she may be at the point where we could reinitiate the Wound VAC currently and hopefully see good results from this. I'm very pleased with how she has responded to the Dakinos soaked gauze packing. 08/13/17 evaluation today patient appears to be doing excellent in  regard to her lower extremity ulcer this seems to be cleaning up very nicely. In regard to the sacral ulcer the area of trauma on the right lateral portion of the wound actually appears  to be much better than previously noted during the last evaluation. The word has definitely filled in and the Wound VAC appears to be helpful I do believe. Overall I'm very pleased with how things seem to be progressing at this time. Patient likewise is also happy that things are doing well. 08/27/17 on evaluation today patient presents for follow-up concerning her ongoing issues with her sacral ulcer and lower extremity ulcer. Fortunately she has been tolerating the dressing changes well complication the Wound VAC in general seems to have done very well up to this point. She does not have any evidence of infection which is good news. She does have some dark discoloration in central portion of the wound which is troublesome for the possibility of pressure injury to the site it sounds like her prior air mattress was not functioning properly her son has just bought a new one for her this is doing much better. Otherwise things seem to be progressing nicely. 09/10/17 on evaluation today patient presents for follow-up concerning her sacral wound and lower extremity ulcer. She has been tolerating the switch of her dressing changes to the Dakinos soaked gauze dressing very well in regard to the sacrum. The left lateral lower extremity ulcer appears to have a new area open just distal to the one that we have been treating with Santyl and this is new since her last visit. There does not appear to be any evidence of infection which is good news. With that being said there's a lot of maceration here at the site and I feel this is likely due to the switch and the dressings it appears that due to the sticking of the dressings it will switch to utilizing a telfa pad over the Santyl. I think this caused a lot of drainage to be collected and situated in the region just under the dressing which has led to this causing which duration of breakdown. Overall there does not appear to be any significant pain which is good  news. Of note I did actually have a conversation with the radiologist who is an interventional radiologist with [facility]. 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 With that being said there does not appear to be any rush to get this done according to the interventional radiologist whom I spoke with. Therefore after discussion it was determined that there gonna wait about six months prior to considering the procedure to get time for hopefully the sacral wound to heal in the interim to at least some degree. 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 According to nursing staff these are all improved, the negative pressure wound therapy system was put on hold last week. We will resume negative pressure wound therapy today, continue with Santyl to the lower extremity and she will follow-up next week. 09/24/17 on evaluation today unfortunately the patient's wound appears to be doing significantly worse compared to last week's evaluation and even my valuation the week before. She actually has bone noted on the left wound margin in the region of undermining unfortunately. This was not noted during the last evaluation. I am concerned about the fact that this seems to be worse not better since  our last visit with her. My biggest concern is that she's likely developing a worsening osteomyelitis. This was discussed with patient and her son today during the office visit. 05/03/17 on evaluation today patient actually appears to be doing rather well in regard to her sacral ulcer compared to last week's evaluation. She has been tolerating the dressing changes without complication. Fortunately even though we're not utilizing the Wound VAC she seems to be making some strides in seeing the wound area overall improved quite dramatically in my pinion in just one weeks time. She also seems to  be staying off of this to the point that I do not see any evidence of new injury which is excellent news. Whatever she's been doing in that regard over the past week I would want her to continue. I did also review the results of her bone culture which revealed group B strep as the responsible organism. Again this is what's causing her osteomyelitis she does have infectious disease appointment scheduled for 10/11/17 10/08/1898 valuation today patient appears to be doing better for the most part in regard to the sacral wound. Overall she is showing signs of having granulation of the bone which is good news. There is an area towards the left of the wound where she does seem to be showing some signs of opening this it would be due to additional pressure to the area unfortunately. Jessica [name], Jessica [name]. ([id number]) There does not appear to be any evidence of infection spreading which is good news that do not see any evidence of significant purulent discharge which is also good news. 10/26/17 on evaluation today patient sacral ulcer actually appears to be very healthy and doing well in regard to the overall appearance of the wound. With that being said she does seem to be having some issues at this point in time with some shear/friction injury of the sacrum from where she was transported to [facility] for her infectious disease appointment. She has been placed on IV antibiotic therapy I will have to go back and find that note for review as I did not have access to that today. Nonetheless I will add the next visit be sure to document the exact antibiotics that she is taking at this point. Nonetheless I do believe the wound is doing better there's no bone exposure nothing that requires debridement in regard to the sacral wound. Likewise her left lateral lower extremity ulcer also appears to be doing significantly better. In general I feel like things are showing signs of improvement all around. 11/12/17 on  evaluation today patient appears to be doing rather well in regard to her sacral wound. In general I do see signs of granulation at this point. Fortunately there does not appear to be any evidence of infection which is good news as well. In general overall happy with the appearance. With that being said I'll be see I would like for this to be progressing more rapidly as with the patient and her son but nonetheless at least things do not appear to be doing worse. Her lower extremity ulcer is doing significantly better. 11/26/17 on evaluation today patient appears to be doing a little better in regard to the sacral wound especially in the peripheral of the wound bed. She actually did have an abrasion on the right gluteal region that's completely resolved to this point. Her lower extremity also appears to be for the most part completely resolved. Overall the sacrum itself seems to be the one thing that is  still open and given her trouble. No fevers, chills, nausea, or vomiting noted at this time. She actually has an appointment next week with infectious disease for recheck to see how things are doing. She maybe switch to oral medications at that point. 12/09/17 on evaluation today patient actually appears to be doing much better in regard to her sacral wound. I do feel like she has made good progress at this time as far as healing is concerned. In fact the wound looks better to me today than it has in quite some time. She is on oral antibiotic therapy and no longer on the IV antibiotics. She did see infectious disease last week. 12/24/17 on evaluation today patient's wounds actually appear to be doing decently well in regard to the sacral wound in particular. When it comes to her lower extremity ulcers both appear to be completely healed at this point in the one area where she had lived the rash has completely resolved at this time. Overall very pleased with the progress that has been made. Nonetheless  the patient seems to be doing well in general. She still does have quite a bit of healing to do in regard to the sacral wound but I feel like she is making progress. 01/07/18 on evaluation today patient actually appears to be doing excellent today regard to her sacral wound. The granular quality in the base of the wound is excellent and shown signs of good improvement there's no evidence of contusion nor deep tissue injury and the periwound actually appears to be doing well. In general I'm very pleased with the overall appearance. 02/04/18 on evaluation today patient's wound actually appears to be doing decently well as far as the granulation is concerned. She has been tolerating the dressing changes without complication. Right now we are still using the Wound VAC. Nonetheless overall there apparently is some cater to the wound and this is concerning simply for the fact that she could be developing a soft tissue infection again. She's had this happen in the past and at that time responded very well to the antibiotics. Nonetheless I don't think I would likely put her on anything prophylactically but rather wait for the results of the culture to return. 02/18/18 on evaluation today patient actually appears to be doing fairly well. She has been tolerating the dressing changes. And the Wound VAC seems to be doing well. Overall I'm very pleased with how things have improved over the past couple weeks since I last saw her. Fortunately there does not appear to be any evidence of overt infection at this time which is good news. She has been taking the antibiotics without complication. Specifically that is the Bactrim DS which was prescribed on 02/08/18. 03/18/18 on evaluation today patient's wound actually does have quite a bit of odor noted compared to previous. With that being said the wound itself overall does not appear to be too significant or worse. There is no event noted in the wound bed which is good  news. With that being said the patient has been worried as the home health nurse stated that she thought she saw bone in the base of the wound. Nonetheless there's a little bit of bruising along the left lateral border of the wound bed which again does not appear to be terrible we have noticed this before but other than this the majority the wound bed appears to be doing excellent in my pinion. I am wondering if there may be some bacterial colonization. This  could be leading to some of the odor that we are noting with the Wound VAC. Jessica [name], Jessica [name] ([id number]) 04/01/18 on evaluation today patient appears to actually be doing well in regard to the overall appearance of the wound itself. She also does not have any odor at this point in regard to the wound surface and I do believe the Bacons is extremely helpful in this regard. With that being said she does have a little bit of deep tissue injury on the medial aspect of the wound. Again last time it was more lateral. The lateral seems to have improved quite well. Nonetheless she also notes by way of her son that this has been very macerated in the past several weeks since I last saw her. Though things overall seem to be doing better I still think that the Wound VAC may help with managing her fluid better I'm wondering if we can attempt a gauze Wound VAC my hope is that this will be able to manage the moisture control as well as continuing with managing the odor of the wound and bacterial load since the Dakinos has done excellent in helping in that regard. 04/15/17 on evaluation today patient actually appears to be doing much better in regard to the overall appearance of her sacral wound. I do believe that the gauze Vac has been of benefit for her. Overall she is tolerating this without complication which is also good news. There is no sign of injury to the sacral region at this point. 04/29/18 on evaluation today patient appears to be doing well in  regard to her sacral ulcer. She sure signs of good granulation and the Gauze VAC is doing very well for her. Overall I'm pleased in this regard. The biggest issue is that the time from Friday till Monday seems to be quite long for her as far as the amount of time to keep the Wound VAC sealed. For that reason her son wonders if he can take this off on Sunday morning in order to give her day of just wet to dry dressing's with the Dakin's solution I think this would be okay. Electronic Signature(s) Signed: 05/05/2018 9:23:56 AM By: Lenda Kelp PA-C Entered By: Lenda Kelp on 05/05/2018 09:05:31 Jessica [name] ([id number]) -------------------------------------------------------------------------------- Physical Exam Details [name]. Date of Service: 04/29/2018 11:00 AM Medical Record Number: [id number] Patient Account Number: [id number] Date of Birth/Sex: 1945/08/27 (73 y.o. F) Treating RN: Curtis Sites Primary Care Provider: Annita Brod Other Clinician: Referring Provider: Annita Brod Treating Provider/Extender: STONE III, HOYT Weeks in Treatment: 48 Constitutional Well-nourished and well-hydrated in no acute distress. Respiratory normal breathing without difficulty. clear to auscultation bilaterally. Cardiovascular regular rate and rhythm with normal S1, S2. Psychiatric this patient is able to make decisions and demonstrates good insight into disease process. Alert and Oriented x 3. pleasant and cooperative. Notes Patient's wound bed did not require sharp debridement at this point. Fortunately she seems to be doing very well at this time as far as the overall granulation and filling in of the wound is concerned. Electronic Signature(s) Signed: 05/05/2018 9:23:56 AM By: Lenda Kelp PA-C Entered By: Lenda Kelp on 05/05/2018 09:06:02 Jessica [name]  ([id number]) -------------------------------------------------------------------------------- Physician Orders Details [name]. Date of Service: 04/29/2018 11:00 AM Medical Record Number: [id number] Patient Account Number: [id number] Date of Birth/Sex: January 21, 1946 (73 y.o. F) Treating RN: Curtis Sites Primary Care Provider: Annita Brod Other Clinician: Referring Provider: Annita Brod Treating  Provider/Extender: Linwood Dibbles, HOYT Weeks in Treatment: 56 Verbal / Phone Orders: No Diagnosis Coding ICD-10 Coding Code Description L89.154 Pressure ulcer of sacral region, stage 4 M48.00 Spinal stenosis, site unspecified R54 Age-related physical debility G89.4 Chronic pain syndrome E78.2 Mixed hyperlipidemia L89.93 Pressure ulcer of unspecified site, stage 3 Wound Cleansing Wound #1 Medial Sacrum o Other: - please cleanse sacral wound with dakins moistened gauze - do not spray dakins on wound Anesthetic (add to Medication List) Wound #1 Medial Sacrum o Topical Lidocaine 4% cream applied to wound bed prior to debridement (In Clinic Only). Skin Barriers/Peri-Wound Care Wound #1 Medial Sacrum o Skin Prep Primary Wound Dressing Wound #1 Medial Sacrum o Pack wound with: - Dakin's soaked gauze in clinic Secondary Dressing Wound #1 Medial Sacrum o ABD pad - secure with tape use until wound vac is placed back on wound Dressing Change Frequency Wound #1 Medial Sacrum o Change Dressing Monday, Wednesday, Friday Follow-up Appointments Wound #1 Medial Sacrum o Return Appointment in 2 weeks. Off-Loading Wound #1 Medial Sacrum Jessica [name], Jessica [name]. ([id number]) o Turn and reposition every 2 hours Additional Orders / Instructions Wound #1 Medial Sacrum o Increase protein intake. Home Health Wound #1 Medial Sacrum o Continue Home Health Visits - Advanced HHRN to start wound vac [facility] has instructed patient's son to remove  NPWT on Sundays to allow for Vashe/Dakin's use between Sundays and Mondays when [facility] reapplies NPWT o Home Health Nurse may visit PRN to address patientos wound care needs. o FACE TO FACE ENCOUNTER: MEDICARE and MEDICAID PATIENTS: I certify that this patient is under my care and that I had a face-to-face encounter that meets the physician face-to-face encounter requirements with this patient on this date. The encounter with the patient was in whole or in part for the following MEDICAL CONDITION: (primary reason for Home Healthcare) MEDICAL NECESSITY: I certify, that based on my findings, NURSING services are a medically necessary home health service. HOME BOUND STATUS: I certify that my clinical findings support that this patient is homebound (i.e., Due to illness or injury, pt requires aid of supportive devices such as crutches, cane, wheelchairs, walkers, the use of special transportation or the assistance of another person to leave their place of residence. There is a normal inability to leave the home and doing so requires considerable and taxing effort. Other absences are for medical reasons / religious services and are infrequent or of short duration when for other reasons). o If current dressing causes regression in wound condition, may D/C ordered dressing product/s and apply Normal Saline Moist Dressing daily until next Wound Healing Center / Other MD appointment. Notify Wound Healing Center of regression in wound condition at [phone]. o Please direct any NON-WOUND related issues/requests for orders to patient's Primary Care Physician Negative Pressure Wound Therapy Wound #1 Medial Sacrum o Wound VAC settings at 125/130 mmHg continuous pressure. Use BLACK/GREEN foam to wound cavity. Use WHITE foam to fill any tunnel/s and/or undermining. Change VAC dressing 3 X WEEK. Change canister as indicated when full. Nurse may titrate settings and frequency of dressing changes as  clinically indicated. - PLEASE CONTINUE ANTIMICROBIAL GAUZE WITH NPWT (PLEASE MOISTEN GAUZE WITH NORMAL SALINE) HHRN to change on Mondays, Wednesdays and Fridays [facility] has instructed patient's son to remove NPWT on Sundays to allow for Vashe/Dakin's use between Sundays and Mondays when [facility] reapplies NPWT o Home Health Nurse may d/c VAC for s/s of increased infection, significant wound regression, or uncontrolled drainage.  Notify Wound Healing Center at [phone]. o Number of foam/gauze pieces used in the dressing = Electronic Signature(s) Signed: 04/29/2018 5:32:05 PM By: Curtis Sites Signed: 05/05/2018 9:23:56 AM By: Lenda Kelp PA-C Entered By: Curtis Sites on 04/29/2018 11:59:15 Jessica [name] ([id number]) -------------------------------------------------------------------------------- Problem List Details [name]. Date of Service: 04/29/2018 11:00 AM Medical Record Number: [id number] Patient Account Number: [id number] Date of Birth/Sex: December 29, 1945 (73 y.o. F) Treating RN: Curtis Sites Primary Care Provider: Annita Brod Other Clinician: Referring Provider: Annita Brod Treating Provider/Extender: STONE III, HOYT Weeks in Treatment: 48 Active Problems ICD-10 Evaluated Encounter Code Description Active Date Today Diagnosis L89.154 Pressure ulcer of sacral region, stage 4 05/27/2017 No Yes M48.00 Spinal stenosis, site unspecified 05/27/2017 No Yes R54 Age-related physical debility 05/27/2017 No Yes G89.4 Chronic pain syndrome 05/27/2017 No Yes E78.2 Mixed hyperlipidemia 05/27/2017 No Yes L89.93 Pressure ulcer of unspecified site, stage 3 05/27/2017 No Yes Inactive Problems Resolved Problems Electronic Signature(s) Signed: 05/05/2018 9:23:56 AM By: Lenda Kelp PA-C Entered By: Lenda Kelp on 04/29/2018 11:13:37 Jessica [name]  ([id number]) -------------------------------------------------------------------------------- Progress Note Details [name]. Date of Service: 04/29/2018 11:00 AM Medical Record Number: [id number] Patient Account Number: [id number] Date of Birth/Sex: May 07, 1945 (73 y.o. F) Treating RN: Curtis Sites Primary Care Provider: Annita Brod Other Clinician: Referring Provider: Annita Brod Treating Provider/Extender: Linwood Dibbles, HOYT Weeks in Treatment: 48 Subjective Chief Complaint Information obtained from Patient she is here for evaluation of a sacral ulcer and bilateral lower extremity ulcers History of Present Illness (HPI) 05/27/17-she is here in initial evaluation for a left-sided sacral stage IV pressure ulcer and bilateral lower extremity, lateral aspect, unstageable pressure ulcers. She is accompanied by her husband and her son, who are her primary caregivers. She is bedbound secondary to spinal stenosis. According to her son and husband she was hospitalized from 10/5-11/1 with healthcare associated pneumonia and altered mental status. During her hospitalization she was intubated, extubated on 10/27. She was discharged with Foley catheter and follow up with urology. According to her son and spouse she developed the sacral ulcer during hospitalization. Home health has been applying Santyl daily. She does have a low air loss mattress and is repositioned every 2-3 hours per her family's report. According to the son and spouse she had an appointment with urology on 12/18 and during that appointment developed discoloration to her bilateral lower extremities which ultimately developed into unstageable pressure ulcers to the lateral aspects of her bilateral lower extremities. There has been no topical treatment applied to these. She continues to have home health. There is no concerns expressed regarding dietary intake, stating she eats 3 meals a day, eating was  provided; she is supplemented with boost with protein. 06/03/17-she is here in follow-up evaluation for sacral and bilateral lower extremity ulcers. Plain film x-ray done today reveals no distraction to the sacrum or coccyx, no visible abnormalities. Home health has ordered the negative pressure wound system but it has not been initiated. We will continue with Hydrofera Blue until initiation of negative pressure wound system and continue with Santyl to bilateral lower extremity ulcers. Follow-up next week 06/10/17-she is here in follow-up evaluation for sacral and bilateral lower extremity ulcers. The wound VAC will be available tomorrow per home health. We will initiate wound VAC therapy to the sacral ulcer 3 times weekly (Thursday, Saturday/Sunday, Tuesday). We will continue with Santyl to the lower extremity ulcers. The patient's son is checking into home health therapy [facility] the  [facility] with the understanding that if VAC changes cannot be performed over the weekend he will need to change his mother's appointments to Monday, Wednesday or Friday. The sacral ulcer clinically does not appear infected but there has been a change in the amount of drainage acutely, there is no significant amount of devitalized tissue, there is no malodor. Wound culture was obtained to evaluate for occult infection we will hold off on antibiotic therapy until sensitivities are resulted. 06/18/17 on evaluation today patient appears to be doing fairly well in my opinion although this is the first time I have seen this patient she has been previously evaluated by Tacey RuizLeah here in our office. She is going to be switching to Fridays to see me due to the Wound VAC schedule being changed on Monday, Wednesday, and Friday. Subsequently she seems to be doing fairly well with the Wound VAC. Her son who was present during evaluation today states that he somewhat stresses of the Wound VAC in making sure that it was  functioning appropriately. With that being said everything seems to be working well he knows [facility] and ensure that it is functioning properly. Overall the wound appears to be nice and clean there is no need for debridement today. She has no discomfort in her bilateral lower extremity ulcers also appear to be improving based on measurements and what this honest tell me about the overall appearance. 07/02/17 on evaluation today I noted in patients wound bed that was actually an odor that had not previously been noted. Subsequently there was also a small area of bone that was not previously noted during my last evaluation. This did appear to be necrotic and was being somewhat forced out by the body around the region of granulation. She does not have any pain which is good news. Nonetheless the overall appearance of the ulcer is making me concerned for the patient having developed osteomyelitis. Currently she is not been on any antibiotics and the Wound VAC has been doing fairly well in general. With that being said I do not think we need to continue the Wound VAC if she potentially has a bone infection. At least not until it is properly addressed with antibiotics. Jessica BradyOVERMAN, [name] ([id number]) 07/09/17 on evaluation today patient appears to actually be doing very well in regard to her sacral ulcer compared to last week. There is actually no exposed bone at this point. 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 With that being said still I do believe she needs to have a referral to infectious disease due to the osteomyelitis I am gonna recommend that she continue with the doxycycline based on the culture results which showed Eikenella Corrodens as the organism identified that tetracycline should work for. She  does not seem to be having any discomfort whatsoever at this point. 07/16/17 on evaluation today patient actually appears to be doing rather well in regard to her sacral wound. I think that the original wound site actually appears much better than previously noted. With that being said she does have a new superficial injury on the right sacral region which appears to be due to according to the sign transport that occurred for her MRI unfortunately. Fortunately this is not too deep and I do think it can be managed but  it was a new area that was not previously noted. Otherwise she has been tolerating the Dakin s soaked gauze packing without complication. 07/30/17 on evaluation today patient presents for reevaluation concerning her sacral wound. She has been tolerating the dressing changes without complication. With that being said her wound is doing so well at this point that I think she may be at the point where we could reinitiate the Wound VAC currently and hopefully see good results from this. I'm very pleased with how she has responded to the [facility] s soaked gauze packing. 08/13/17 evaluation today patient appears to be doing excellent in regard to her lower extremity ulcer this seems to be cleaning up very nicely. In regard to the sacral ulcer the area of trauma on the right lateral portion of the wound actually appears to be much better than previously noted during the last evaluation. The word has definitely filled in and the Wound VAC appears to be helpful I do believe. Overall I'm very pleased with how things seem to be progressing at this time. Patient likewise is also happy that things are doing well. 08/27/17 on evaluation today patient presents for follow-up concerning her ongoing issues with her sacral ulcer and lower extremity ulcer. Fortunately she has been tolerating the dressing changes well complication the Wound VAC in general seems to have done very well up to this point. She does not  have any evidence of infection which is good news. She does have some dark discoloration in central portion of the wound which is troublesome for the possibility of pressure injury to the site it sounds like her prior air mattress was not functioning properly her son has just bought a new one for her this is doing much better. Otherwise things seem to be progressing nicely. 09/10/17 on evaluation today patient presents for follow-up concerning her sacral wound and lower extremity ulcer. She has been tolerating the switch of her dressing changes to the Dakin s soaked gauze dressing very well in regard to the sacrum. The left lateral lower extremity ulcer appears to have a new area open just distal to the one that we have been treating with Santyl and this is new since her last visit. There does not appear to be any evidence of infection which is good news. With that being said there's a lot of maceration here at the site and I feel this is likely due to the switch and the dressings it appears that due to the sticking of the dressings it will switch to utilizing a telfa pad over the Santyl. I think this caused a lot of drainage to be collected and situated in the region just under the dressing which has led to this causing which duration of breakdown. Overall there does not appear to be any significant pain which is good news. Of note I did actually have a conversation with the radiologist who is an interventional radiologist with [facility]. 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 With that being said there does not appear to be any rush to get this done according to the interventional radiologist whom I spoke with. Therefore after discussion it was determined that there gonna wait about six months prior to considering the procedure to get time for hopefully the sacral wound to heal in the interim to at least some  degree. 09/17/17-She is here in follow-up evaluation for sacral stage IV and lower from the  ulcer. According to nursing staff these are all improved, the negative pressure wound therapy system was put on hold last week. We will resume negative pressure wound therapy today, continue with Santyl to the lower extremity and she will follow-up next week. 09/24/17 on evaluation today unfortunately the patient's wound appears to be doing significantly worse compared to last week's evaluation and even my valuation the week before. She actually has bone noted on the left wound margin in the region of undermining unfortunately. This was not noted during the last evaluation. I am concerned about the fact that this seems to be worse not better since our last visit with her. My biggest concern is that she's likely developing a worsening osteomyelitis. This was discussed with patient and her son today during the office visit. 05/03/17 on evaluation today patient actually appears to be doing rather well in regard to her sacral ulcer compared to last week's evaluation. She has been tolerating the dressing changes without complication. Fortunately even though we're not utilizing the Wound VAC she seems to be making some strides in seeing the wound area overall improved quite dramatically in my pinion in just one weeks time. She also seems to be staying off of this to the point that I do not see any evidence of new injury which is excellent news. Whatever she's been doing in that regard over the past week I would want her to continue. Jessica [name], [name] ([id number]) did also review the results of her bone culture which revealed group B strep as the responsible organism. Again this is what's causing her osteomyelitis she does have infectious disease appointment scheduled for 10/11/17 10/08/1898 valuation today patient appears to be doing better for the most part in regard to the sacral wound. Overall she is showing signs  of having granulation of the bone which is good news. There is an area towards the left of the wound where she does seem to be showing some signs of opening this it would be due to additional pressure to the area unfortunately. There does not appear to be any evidence of infection spreading which is good news that do not see any evidence of significant purulent discharge which is also good news. 10/26/17 on evaluation today patient sacral ulcer actually appears to be very healthy and doing well in regard to the overall appearance of the wound. With that being said she does seem to be having some issues at this point in time with some shear/friction injury of the [place] [place] [facility]. She has been placed on IV antibiotic therapy I will have to go back and find that note for review as I did not have access to that today. Nonetheless I will add the next visit be sure to document the exact antibiotics that she is taking at this point. Nonetheless I do believe the wound is doing better there's no bone exposure nothing that requires debridement in regard to the sacral wound. Likewise her left lateral lower extremity ulcer also appears to be doing significantly better. In general I feel like things are showing signs of improvement all around. 11/12/17 on evaluation today patient appears to be doing rather well in regard to her sacral wound. In general I do see signs of granulation at this point. Fortunately there does not appear to be any evidence of infection which is good news as well. In general overall happy with the appearance. With that being said I'll be  see I would like for this to be progressing more rapidly as with the patient and her son but nonetheless at least things do not appear to be doing worse. Her lower extremity ulcer is doing significantly better. 11/26/17 on evaluation today patient appears to be doing a little better  in regard to the sacral wound especially in the peripheral of the wound bed. She actually did have an abrasion on the right gluteal region that's completely resolved to this point. Her lower extremity also appears to be for the most part completely resolved. Overall the sacrum itself seems to be the one thing that is still open and given her trouble. No fevers, chills, nausea, or vomiting noted at this time. She actually has an appointment next week with infectious disease for recheck to see how things are doing. She maybe switch to oral medications at that point. 12/09/17 on evaluation today patient actually appears to be doing much better in regard to her sacral wound. I do feel like she has made good progress at this time as far as healing is concerned. In fact the wound looks better to me today than it has in quite some time. She is on oral antibiotic therapy and no longer on the IV antibiotics. She did see infectious disease last week. 12/24/17 on evaluation today patient's wounds actually appear to be doing decently well in regard to the sacral wound in particular. When it comes to her lower extremity ulcers both appear to be completely healed at this point in the one area where she had lived the rash has completely resolved at this time. Overall very pleased with the progress that has been made. Nonetheless the patient seems to be doing well in general. She still does have quite a bit of healing to do in regard to the sacral wound but I feel like she is making progress. 01/07/18 on evaluation today patient actually appears to be doing excellent today regard to her sacral wound. The granular quality in the base of the wound is excellent and shown signs of good improvement there's no evidence of contusion nor deep tissue injury and the periwound actually appears to be doing well. In general I'm very pleased with the overall appearance. 02/04/18 on evaluation today patient's wound actually appears  to be doing decently well as far as the granulation is concerned. She has been tolerating the dressing changes without complication. Right now we are still using the Wound VAC. Nonetheless overall there apparently is some cater to the wound and this is concerning simply for the fact that she could be developing a soft tissue infection again. She's had this happen in the past and at that time responded very well to the antibiotics. Nonetheless I don't think I would likely put her on anything prophylactically but rather wait for the results of the culture to return. 02/18/18 on evaluation today patient actually appears to be doing fairly well. She has been tolerating the dressing changes. And the Wound VAC seems to be doing well. Overall I'm very pleased with how things have improved over the past couple weeks since I last saw her. Fortunately there does not appear to be any evidence of overt infection at this time which is good news. She has been taking the antibiotics without complication. Specifically that is the Bactrim DS which was prescribed on 02/08/18. 03/18/18 on evaluation today patient's wound actually does have quite a bit of odor noted compared to previous. With that Jessica [name], Jessica [name] ([id number]) being said  the wound itself overall does not appear to be too significant or worse. There is no event noted in the wound bed which is good news. With that being said the patient has been worried as the home health nurse stated that she thought she saw bone in the base of the wound. Nonetheless there's a little bit of bruising along the left lateral border of the wound bed which again does not appear to be terrible we have noticed this before but other than this the majority the wound bed appears to be doing excellent in my pinion. I am wondering if there may be some bacterial colonization. This could be leading to some of the odor that we are noting with the Wound VAC. 04/01/18 on evaluation  today patient appears to actually be doing well in regard to the overall appearance of the wound itself. She also does not have any odor at this point in regard to the wound surface and I do believe the Bacons is extremely helpful in this regard. With that being said she does have a little bit of deep tissue injury on the medial aspect of the wound. Again last time it was more lateral. The lateral seems to have improved quite well. Nonetheless she also notes by way of her son that this has been very macerated in the past several weeks since I last saw her. Though things overall seem to be doing better I still think that the Wound VAC may help with managing her fluid better I'm wondering if we can attempt a gauze Wound VAC my hope is that this will be able to manage the moisture control as well as continuing with managing the odor of the wound and bacterial load since the Dakin s has done excellent in helping in that regard. 04/15/17 on evaluation today patient actually appears to be doing much better in regard to the overall appearance of her sacral wound. I do believe that the gauze Vac has been of benefit for her. Overall she is tolerating this without complication which is also good news. There is no sign of injury to the sacral region at this point. 04/29/18 on evaluation today patient appears to be doing well in regard to her sacral ulcer. She sure signs of good granulation and the Gauze VAC is doing very well for her. Overall I'm pleased in this regard. The biggest issue is that the time from Friday till Monday seems to be quite long for her as far as the amount of time to keep the Wound VAC sealed. For that reason her son wonders if he can take this off on Sunday morning in order to give her day of just wet to dry dressing's with the Dakin's solution I think this would be okay. Patient History Information obtained from Patient. Family History No family history of Cancer, Diabetes, Heart  Disease. Social History Never smoker, Marital Status - Married. Review of Systems (ROS) Constitutional Symptoms (General Health) Denies complaints or symptoms of Fever, Chills. Respiratory The patient has no complaints or symptoms. Cardiovascular The patient has no complaints or symptoms. Psychiatric The patient has no complaints or symptoms. Objective Constitutional Well-nourished and well-hydrated in no acute distress. Jessica [name], Jessica [name] ([id number]) Vitals Time Taken: 11:12 AM, Height: 70 in, Temperature: 98.1 F, Pulse: 71 bpm, Respiratory Rate: 18 breaths/min, Blood Pressure: 132/55 mmHg. Respiratory normal breathing without difficulty. clear to auscultation bilaterally. Cardiovascular regular rate and rhythm with normal S1, S2. Psychiatric this patient is able to make decisions and  demonstrates good insight into disease process. Alert and Oriented x 3. pleasant and cooperative. General Notes: Patient's wound bed did not require sharp debridement at this point. Fortunately she seems to be doing very well at this time as far as the overall granulation and filling in of the wound is concerned. Integumentary (Hair, Skin) Wound #1 status is Open. Original cause of wound was Pressure Injury. The wound is located on the Medial Sacrum. The wound measures 7.9cm length x 3.1cm width x 2cm depth; 19.234cm^2 area and 38.469cm^3 volume. There is muscle, Fat Layer (Subcutaneous Tissue) Exposed, and fascia exposed. There is no tunneling noted, however, there is undermining starting at 10:00 and ending at 12:00 with a maximum distance of 2.5cm. There is a medium amount of sanguinous drainage noted. Foul odor after cleansing was noted. The wound margin is distinct with the outline attached to the wound base. There is small (1-33%) red, pink, hyper - granulation within the wound bed. There is a large (67-100%) amount of necrotic tissue within the wound bed including Eschar and Adherent Slough.  The periwound skin appearance exhibited: Induration, Scarring, Maceration, Rubor, Erythema. The periwound skin appearance did not exhibit: Callus, Crepitus, Excoriation, Rash, Dry/Scaly, Atrophie Blanche, Cyanosis, Ecchymosis, Hemosiderin Staining, Mottled, Pallor. The surrounding wound skin color is noted with erythema which is circumferential. Periwound temperature was noted as No Abnormality. The periwound has tenderness on palpation. Assessment Active Problems ICD-10 Pressure ulcer of sacral region, stage 4 Spinal stenosis, site unspecified Age-related physical debility Chronic pain syndrome Mixed hyperlipidemia Pressure ulcer of unspecified site, stage 3 Plan Wound Cleansing: Wound #1 Medial Sacrum: Other: - please cleanse sacral wound with dakins moistened gauze - do not spray dakins on wound Anesthetic (add to Medication List): Wound #1 Medial Sacrum: Jessica [name], Jessica [name]. ([id number]) Topical Lidocaine 4% cream applied to wound bed prior to debridement (In Clinic Only). Skin Barriers/Peri-Wound Care: Wound #1 Medial Sacrum: Skin Prep Primary Wound Dressing: Wound #1 Medial Sacrum: Pack wound with: - Dakin's soaked gauze in clinic Secondary Dressing: Wound #1 Medial Sacrum: ABD pad - secure with tape use until wound vac is placed back on wound Dressing Change Frequency: Wound #1 Medial Sacrum: Change Dressing Monday, Wednesday, Friday Follow-up Appointments: Wound #1 Medial Sacrum: Return Appointment in 2 weeks. Off-Loading: Wound #1 Medial Sacrum: Turn and reposition every 2 hours Additional Orders / Instructions: Wound #1 Medial Sacrum: Increase protein intake. Home Health: Wound #1 Medial Sacrum: Continue Home Health Visits - Advanced HHRN to start wound vac [facility] has instructed patient's son to remove NPWT on Sundays to allow for Vashe/Dakin's use between Sundays and Mondays when [facility] reapplies NPWT Home Health Nurse may visit PRN to address  patient s wound care needs. FACE TO FACE ENCOUNTER: MEDICARE and MEDICAID PATIENTS: I certify that this patient is under my care and that I had a face-to-face encounter that meets the physician face-to-face encounter requirements with this patient on this date. The encounter with the patient was in whole or in part for the following MEDICAL CONDITION: (primary reason for Home Healthcare) MEDICAL NECESSITY: I certify, that based on my findings, NURSING services are a medically necessary home health service. HOME BOUND STATUS: I certify that my clinical findings support that this patient is homebound (i.e., Due to illness or injury, pt requires aid of supportive devices such as crutches, cane, wheelchairs, walkers, the use of special transportation or the assistance of another person to leave their place of residence. There is a normal inability to  leave the home and doing so requires considerable and taxing effort. Other absences are for medical reasons / religious services and are infrequent or of short duration when for other reasons). If current dressing causes regression in wound condition, may D/C ordered dressing product/s and apply Normal Saline Moist Dressing daily until next Wound Healing Center / Other MD appointment. Notify Wound Healing Center of regression in wound condition at [phone]. Please direct any NON-WOUND related issues/requests for orders to patient's Primary Care Physician Negative Pressure Wound Therapy: Wound #1 Medial Sacrum: Wound VAC settings at 125/130 mmHg continuous pressure. Use BLACK/GREEN foam to wound cavity. Use WHITE foam to fill any tunnel/s and/or undermining. Change VAC dressing 3 X WEEK. Change canister as indicated when full. Nurse may titrate settings and frequency of dressing changes as clinically indicated. - PLEASE CONTINUE ANTIMICROBIAL GAUZE WITH NPWT (PLEASE MOISTEN GAUZE WITH NORMAL SALINE) HHRN to change on Mondays, Wednesdays and Fridays  [facility] has instructed patient's son to remove NPWT on Sundays to allow for Vashe/Dakin's use between Sundays and Mondays when [facility] reapplies NPWT Home Health Nurse may d/c VAC for s/s of increased infection, significant wound regression, or uncontrolled drainage. Notify Wound Healing Center at [phone]. Number of foam/gauze pieces used in the dressing = My suggestion is gonna be that we continue with the Current wound care measures the patient and her son are in agreement with this plan. We will see that her back for reevaluation. Please see above for specific wound care orders. We will see patient for re-evaluation in 2 week(s) here in the clinic. If anything worsens or changes patient will contact our office for additional recommendations. Jessica [name], Jessica [name] ([id number]) Electronic Signature(s) Signed: 05/05/2018 9:23:56 AM By: Lenda Kelp PA-C Entered By: Lenda Kelp on 05/05/2018 09:06:13 Jessica [name] ([id number]) -------------------------------------------------------------------------------- ROS/PFSH Details [name]. Date of Service: 04/29/2018 11:00 AM Medical Record Number: [id number] Patient Account Number: [id number] Date of Birth/Sex: 1945/09/22 (73 y.o. F) Treating RN: Curtis Sites Primary Care Provider: Annita Brod Other Clinician: Referring Provider: Annita Brod Treating Provider/Extender: STONE III, HOYT Weeks in Treatment: 48 Information Obtained From Patient Wound History Constitutional Symptoms (General Health) Complaints and Symptoms: Negative for: Fever; Chills Respiratory Complaints and Symptoms: No Complaints or Symptoms Cardiovascular Complaints and Symptoms: No Complaints or Symptoms Psychiatric Complaints and Symptoms: No Complaints or Symptoms Immunizations Pneumococcal Vaccine: Received Pneumococcal Vaccination: No Implantable Devices Family and Social History Cancer: No;  Diabetes: No; Heart Disease: No; Never smoker; Marital Status - Married Physician Affirmation I have reviewed and agree with the above information. Electronic Signature(s) Signed: 05/05/2018 9:23:56 AM By: Lenda Kelp PA-C Signed: 05/05/2018 5:09:04 PM By: Curtis Sites Entered By: Lenda Kelp on 05/05/2018 09:05:49 Jessica [name] ([id number]) -------------------------------------------------------------------------------- SuperBill Details [name]. Date of Service: 04/29/2018 Medical Record Number: [id number] Patient Account Number: [id number] Date of Birth/Sex: [birth year] (73 y.o. F) Treating RN: Curtis Sites Primary Care Provider: Annita Brod Other Clinician: Referring Provider: Annita Brod Treating Provider/Extender: STONE III, HOYT Weeks in Treatment: 48 Diagnosis Coding ICD-10 Codes Code Description L89.154 Pressure ulcer of sacral region, stage 4 M48.00 Spinal stenosis, site unspecified R54 Age-related physical debility G89.4 Chronic pain syndrome E78.2 Mixed hyperlipidemia L89.93 Pressure ulcer of unspecified site, stage 3 Facility Procedures CPT4 Code: [id number] Description: 99213 - WOUND CARE VISIT-LEV 3 EST PT Modifier: Quantity: 1 Physician Procedures CPT4 Code: [id number] Description: 99214 - WC PHYS LEVEL 4 - EST PT ICD-10 Diagnosis Description L89.154  Pressure ulcer of sacral region, stage 4 M48.00 Spinal stenosis, site unspecified R54 Age-related physical debility G89.4 Chronic pain syndrome Modifier: Quantity: 1 Electronic Signature(s) Signed: 05/02/2018 9:58:15 PM By: Lenda Kelp PA-C Entered By: Lenda Kelp on 05/02/2018 21:49:42

## 2018-05-13 ENCOUNTER — Encounter: Payer: Medicare Other | Admitting: Physician Assistant

## 2018-05-13 DIAGNOSIS — L89154 Pressure ulcer of sacral region, stage 4: Secondary | ICD-10-CM | POA: Diagnosis not present

## 2018-05-15 NOTE — Progress Notes (Addendum)
[name], SZUCH ([id number]) Visit Report for 05/13/2018 Chief Complaint Document Details [name], Jessica [name]. Date of Service: 05/13/2018 10:00 AM Medical Record Number: [id number] Patient Account Number: [id number] Date of Birth/Sex: [birth year] (73 y.o. F) Treating RN: Curtis Sites Primary Care Provider: Annita Brod Other Clinician: Referring Provider: Annita Brod Treating Provider/Extender: STONE III, Cam Dauphin Weeks in Treatment: 50 Information Obtained from: Patient Chief Complaint she is here for evaluation of a sacral ulcer and bilateral lower extremity ulcers Electronic Signature([name]) Signed: 05/13/2018 5:12:31 PM By: Lenda Kelp PA-C Entered By: Lenda Kelp on 05/13/2018 10:16:09 Jessica [name] ([id number]) -------------------------------------------------------------------------------- HPI Details [name]. Date of Service: 05/13/2018 10:00 AM Medical Record Number: [id number] Patient Account Number: [id number] Date of Birth/Sex: [birth year] (73 y.o. F) Treating RN: Curtis Sites Primary Care Provider: Annita Brod Other Clinician: Referring Provider: Annita Brod Treating Provider/Extender: Linwood Dibbles, Whittney Steenson Weeks in Treatment: 50 History of Present Illness HPI Description: 05/27/17-she is here in initial evaluation for a left-sided sacral stage IV pressure ulcer and bilateral lower extremity, lateral aspect, unstageable pressure ulcers. She is accompanied by her husband and her son, who are her primary caregivers. She is bedbound secondary to spinal stenosis. According to her son and husband she was hospitalized from 10/5-11/1 with healthcare associated pneumonia and altered mental status. During her hospitalization she was intubated, extubated on 10/27. She was discharged with Foley catheter and follow up with urology. According to her son and spouse she developed the sacral ulcer during hospitalization. Home health has  been applying Santyl daily. She does have a low air loss mattress and is repositioned every 2-3 hours per her family'[name] report. According to the son and spouse she had an appointment with urology on 12/18 and during that appointment developed discoloration to her bilateral lower extremities which ultimately developed into unstageable pressure ulcers to the lateral aspects of her bilateral lower extremities. There has been no topical treatment applied to these. She continues to have home health. There is no concerns expressed regarding dietary intake, stating she eats 3 meals a day, eating was provided; she is supplemented with boost with protein. 06/03/17-she is here in follow-up evaluation for sacral and bilateral lower extremity ulcers. Plain film x-ray done today reveals no distraction to the sacrum or coccyx, no visible abnormalities. Home health has ordered the negative pressure wound system but it has not been initiated. We will continue with Hydrofera Blue until initiation of negative pressure wound system and continue with Santyl to bilateral lower extremity ulcers. Follow-up next week 06/10/17-she is here in follow-up evaluation for sacral and bilateral lower extremity ulcers. The wound VAC will be available tomorrow per home health. We will initiate wound VAC therapy to the sacral ulcer 3 times weekly (Thursday, Saturday/Sunday, Tuesday). We will continue with Santyl to the lower extremity ulcers. The patient'[name] son is checking into home health therapy over the weekend for [facility] changes, with the understanding that if VAC changes cannot be performed over the weekend he will need to change his mother'[name] appointments to Monday, Wednesday or Friday. The sacral ulcer clinically does not appear infected but there has been a change in the amount of drainage acutely, there is no significant amount of devitalized tissue, there is no malodor. Wound culture was obtained to evaluate for occult infection we  will hold off on antibiotic therapy until sensitivities are resulted. 06/18/17 on evaluation today patient appears to be doing fairly well in my opinion although this is the first time I  have seen this patient she has been previously evaluated by Tacey RuizLeah here in our office. She is going to be switching to Fridays to see me due to the Wound VAC schedule being changed on Monday, Wednesday, and Friday. Subsequently she seems to be doing fairly well with the Wound VAC. Her son who was present during evaluation today states that he somewhat stresses of the Wound VAC in making sure that it was functioning appropriately. With that being said everything seems to be working well he knows what settings on the [facility] HVAC itself to look at and ensure that it is functioning properly. Overall the wound appears to be nice and clean there is no need for debridement today. She has no discomfort in her bilateral lower extremity ulcers also appear to be improving based on measurements and what this honest tell me about the overall appearance. 07/02/17 on evaluation today I noted in patients wound bed that was actually an odor that had not previously been noted. Subsequently there was also a small area of bone that was not previously noted during my last evaluation. This did appear to be necrotic and was being somewhat forced out by the body around the region of granulation. She does not have any pain which is good news. Nonetheless the overall appearance of the ulcer is making me concerned for the patient having developed osteomyelitis. Currently she is not been on any antibiotics and the Wound VAC has been doing fairly well in general. With that being said I do not think we need to continue the Wound VAC if she potentially has a bone infection. At least not until it is properly addressed with antibiotics. 07/09/17 on evaluation today patient appears to actually be doing very well in regard to her sacral ulcer compared to last  week. There is actually no exposed bone at this point. 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 With that being said still I do believe she needs to have a referral to infectious disease due to the osteomyelitis I am gonna recommend that she continue with the doxycycline based on the culture results which showed Eikenella Corrodens as the Jessica BradyOVERMAN, [name] ([id number]) organism identified that tetracycline should work for. She does not seem to be having any discomfort whatsoever at this point. 07/16/17 on evaluation today patient actually appears to be doing rather well in regard to her sacral wound. I think that the original wound site actually appears much better than previously noted. With that being said she does have a new superficial injury on the right sacral region which appears to be due to according to the sign transport that occurred for her MRI unfortunately. Fortunately this is not too deep and I do think it can be managed but it was a new area that was not previously noted. Otherwise she has been tolerating the Dakinos soaked gauze packing without complication. 07/30/17 on evaluation today patient presents for reevaluation concerning her sacral wound. She has been tolerating the dressing changes without complication. With that being said her wound is doing so well at this point that I think she may be at the point where we could reinitiate the Wound VAC currently and hopefully see good results from this. I'm very pleased with how she has responded to the Dakinos soaked gauze packing. 08/13/17 evaluation today patient appears to be doing excellent in  regard to her lower extremity ulcer this seems to be cleaning up very nicely. In regard to the sacral ulcer the area of trauma on the right lateral portion of the wound actually appears  to be much better than previously noted during the last evaluation. The word has definitely filled in and the Wound VAC appears to be helpful I do believe. Overall I'm very pleased with how things seem to be progressing at this time. Patient likewise is also happy that things are doing well. 08/27/17 on evaluation today patient presents for follow-up concerning her ongoing issues with her sacral ulcer and lower extremity ulcer. Fortunately she has been tolerating the dressing changes well complication the Wound VAC in general seems to have done very well up to this point. She does not have any evidence of infection which is good news. She does have some dark discoloration in central portion of the wound which is troublesome for the possibility of pressure injury to the site it sounds like her prior air mattress was not functioning properly her son has just bought a new one for her this is doing much better. Otherwise things seem to be progressing nicely. 09/10/17 on evaluation today patient presents for follow-up concerning her sacral wound and lower extremity ulcer. She has been tolerating the switch of her dressing changes to the Dakinos soaked gauze dressing very well in regard to the sacrum. The left lateral lower extremity ulcer appears to have a new area open just distal to the one that we have been treating with Santyl and this is new since her last visit. There does not appear to be any evidence of infection which is good news. With that being said there'[name] a lot of maceration here at the site and I feel this is likely due to the switch and the dressings it appears that due to the sticking of the dressings it will switch to utilizing a telfa pad over the Santyl. I think this caused a lot of drainage to be collected and situated in the region just under the dressing which has led to this causing which duration of breakdown. Overall there does not appear to be any significant pain which is good  news. Of note I did actually have a conversation with the radiologist who is an interventional radiologist with [facility]. 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 With that being said there does not appear to be any rush to get this done according to the interventional radiologist whom I spoke with. Therefore after discussion it was determined that there gonna wait about six months prior to considering the procedure to get time for hopefully the sacral wound to heal in the interim to at least some degree. 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 According to nursing staff these are all improved, the negative pressure wound therapy system was put on hold last week. We will resume negative pressure wound therapy today, continue with Santyl to the lower extremity and she will follow-up next week. 09/24/17 on evaluation today unfortunately the patient'[name] wound appears to be doing significantly worse compared to last week'[name] evaluation and even my valuation the week before. She actually has bone noted on the left wound margin in the region of undermining unfortunately. This was not noted during the last evaluation. I am concerned about the fact that this seems to be worse not better since  our last visit with her. My biggest concern is that she'[name] likely developing a worsening osteomyelitis. This was discussed with patient and her son today during the office visit. 05/03/17 on evaluation today patient actually appears to be doing rather well in regard to her sacral ulcer compared to last week'[name] evaluation. She has been tolerating the dressing changes without complication. Fortunately even though we're not utilizing the Wound VAC she seems to be making some strides in seeing the wound area overall improved quite dramatically in my pinion in just one weeks time. She also seems to  be staying off of this to the point that I do not see any evidence of new injury which is excellent news. Whatever she'[name] been doing in that regard over the past week I would want her to continue. I did also review the results of her bone culture which revealed group B strep as the responsible organism. Again this is what'[name] causing her osteomyelitis she does have infectious disease appointment scheduled for 10/11/17 10/08/1898 valuation today patient appears to be doing better for the most part in regard to the sacral wound. Overall she is showing signs of having granulation of the bone which is good news. There is an area towards the left of the wound where she does seem to be showing some signs of opening this it would be due to additional pressure to the area unfortunately. Jessica [name], Jessica [name]. ([id number]) There does not appear to be any evidence of infection spreading which is good news that do not see any evidence of significant purulent discharge which is also good news. 10/26/17 on evaluation today patient sacral ulcer actually appears to be very healthy and doing well in regard to the overall appearance of the wound. With that being said she does seem to be having some issues at this point in time with some shear/friction injury of the sacrum from where she was transported to [facility] for her infectious disease appointment. She has been placed on IV antibiotic therapy I will have to go back and find that note for review as I did not have access to that today. Nonetheless I will add the next visit be sure to document the exact antibiotics that she is taking at this point. Nonetheless I do believe the wound is doing better there'[name] no bone exposure nothing that requires debridement in regard to the sacral wound. Likewise her left lateral lower extremity ulcer also appears to be doing significantly better. In general I feel like things are showing signs of improvement all around. 11/12/17 on  evaluation today patient appears to be doing rather well in regard to her sacral wound. In general I do see signs of granulation at this point. Fortunately there does not appear to be any evidence of infection which is good news as well. In general overall happy with the appearance. With that being said I'll be see I would like for this to be progressing more rapidly as with the patient and her son but nonetheless at least things do not appear to be doing worse. Her lower extremity ulcer is doing significantly better. 11/26/17 on evaluation today patient appears to be doing a little better in regard to the sacral wound especially in the peripheral of the wound bed. She actually did have an abrasion on the right gluteal region that'[name] completely resolved to this point. Her lower extremity also appears to be for the most part completely resolved. Overall the sacrum itself seems to be the one thing that is  still open and given her trouble. No fevers, chills, nausea, or vomiting noted at this time. She actually has an appointment next week with infectious disease for recheck to see how things are doing. She maybe switch to oral medications at that point. 12/09/17 on evaluation today patient actually appears to be doing much better in regard to her sacral wound. I do feel like she has made good progress at this time as far as healing is concerned. In fact the wound looks better to me today than it has in quite some time. She is on oral antibiotic therapy and no longer on the IV antibiotics. She did see infectious disease last week. 12/24/17 on evaluation today patient'[name] wounds actually appear to be doing decently well in regard to the sacral wound in particular. When it comes to her lower extremity ulcers both appear to be completely healed at this point in the one area where she had lived the rash has completely resolved at this time. Overall very pleased with the progress that has been made. Nonetheless  the patient seems to be doing well in general. She still does have quite a bit of healing to do in regard to the sacral wound but I feel like she is making progress. 01/07/18 on evaluation today patient actually appears to be doing excellent today regard to her sacral wound. The granular quality in the base of the wound is excellent and shown signs of good improvement there'[name] no evidence of contusion nor deep tissue injury and the periwound actually appears to be doing well. In general I'm very pleased with the overall appearance. 02/04/18 on evaluation today patient'[name] wound actually appears to be doing decently well as far as the granulation is concerned. She has been tolerating the dressing changes without complication. Right now we are still using the Wound VAC. Nonetheless overall there apparently is some cater to the wound and this is concerning simply for the fact that she could be developing a soft tissue infection again. She'[name] had this happen in the past and at that time responded very well to the antibiotics. Nonetheless I don't think I would likely put her on anything prophylactically but rather wait for the results of the culture to return. 02/18/18 on evaluation today patient actually appears to be doing fairly well. She has been tolerating the dressing changes. And the Wound VAC seems to be doing well. Overall I'm very pleased with how things have improved over the past couple weeks since I last saw her. Fortunately there does not appear to be any evidence of overt infection at this time which is good news. She has been taking the antibiotics without complication. Specifically that is the Bactrim DS which was prescribed on 02/08/18. 03/18/18 on evaluation today patient'[name] wound actually does have quite a bit of odor noted compared to previous. With that being said the wound itself overall does not appear to be too significant or worse. There is no event noted in the wound bed which is good  news. With that being said the patient has been worried as the home health nurse stated that she thought she saw bone in the base of the wound. Nonetheless there'[name] a little bit of bruising along the left lateral border of the wound bed which again does not appear to be terrible we have noticed this before but other than this the majority the wound bed appears to be doing excellent in my pinion. I am wondering if there may be some bacterial colonization. This  could be leading to some of the odor that we are noting with the Wound VAC. AIMAR, [place] ([id number]) 04/01/18 on evaluation today patient appears to actually be doing well in regard to the overall appearance of the wound itself. She also does not have any odor at this point in regard to the wound surface and I do believe the Bacons is extremely helpful in this regard. With that being said she does have a little bit of deep tissue injury on the medial aspect of the wound. Again last time it was more lateral. The lateral seems to have improved quite well. Nonetheless she also notes by way of her son that this has been very macerated in the past several weeks since I last saw her. Though things overall seem to be doing better I still think that the Wound VAC may help with managing her fluid better I'm wondering if we can attempt a gauze Wound VAC my hope is that this will be able to manage the moisture control as well as continuing with managing the odor of the wound and bacterial load since the Dakinos has done excellent in helping in that regard. 04/15/17 on evaluation today patient actually appears to be doing much better in regard to the overall appearance of her sacral wound. I do believe that the gauze Vac has been of benefit for her. Overall she is tolerating this without complication which is also good news. There is no sign of injury to the sacral region at this point. 04/29/18 on evaluation today patient appears to be doing well in  regard to her sacral ulcer. She sure signs of good granulation and the Gauze VAC is doing very well for her. Overall I'm pleased in this regard. The biggest issue is that the time from Friday till Monday seems to be quite long for her as far as the amount of time to keep the Wound VAC sealed. For that reason her son wonders if he can take this off on Sunday morning in order to give her day of just wet to dry dressing'[name] with the Jessica [name] solution I think this would be okay. 05/13/18 on evaluation today patient appears to be doing okay in regard to her sacral ulcer. There does not appear to be any evidence of overt infection at this time which is good news. In general the drainage and moisture appears to be significantly improved this was after the pressure on the Wound VAC was increased after they contacted our office. With that being said a lot of the dark tissue around the edge of the wound has improved in the maceration is significantly better this seems to be managing much better. Fortunately there is no signs of infection at this time. Overall the patient is doing quite well. The wound measurements are not significantly smaller today although they were over the past two visits we will see how things go. Electronic Signature([name]) Signed: 05/13/2018 5:12:31 PM By: Lenda Kelp PA-C Entered By: Lenda Kelp on 05/13/2018 11:14:23 Jessica [name] ([id number]) -------------------------------------------------------------------------------- Physical Exam Details [name]. Date of Service: 05/13/2018 10:00 AM Medical Record Number: [id number] Patient Account Number: [id number] Date of Birth/Sex: 1945-05-18 (73 y.o. F) Treating RN: Curtis Sites Primary Care Provider: Annita Brod Other Clinician: Referring Provider: Annita Brod Treating Provider/Extender: STONE III, Nellie Pester Weeks in Treatment: 50 Constitutional Well-nourished and well-hydrated in no acute  distress. Respiratory normal breathing without difficulty. clear to auscultation bilaterally. Cardiovascular regular rate and rhythm with normal  S1, S2. Psychiatric this patient is able to make decisions and demonstrates good insight into disease process. Alert and Oriented x 3. pleasant and cooperative. Notes Patient'[name] wound bed currently shows evidence of good granulation which is minimal Slough noted in scattered locations. There'[name] no signs of overt infection which is good news. Overall I'm rather pleased with how things seem to be progressing in that regard. We just need to make sure that things continue to improve from the standpoint of size. Electronic Signature([name]) Signed: 05/13/2018 5:12:31 PM By: Lenda Kelp PA-C Entered By: Lenda Kelp on 05/13/2018 11:15:27 Jessica [name] ([id number]) -------------------------------------------------------------------------------- Physician Orders Details [name]. Date of Service: 05/13/2018 10:00 AM Medical Record Number: [id number] Patient Account Number: [id number] Date of Birth/Sex: [birth year] (73 y.o. F) Treating RN: Curtis Sites Primary Care Provider: Annita Brod Other Clinician: Referring Provider: Annita Brod Treating Provider/Extender: STONE III, Naketa Daddario Weeks in Treatment: 50 Verbal / Phone Orders: No Diagnosis Coding ICD-10 Coding Code Description L89.154 Pressure ulcer of sacral region, stage 4 M48.00 Spinal stenosis, site unspecified R54 Age-related physical debility G89.4 Chronic pain syndrome E78.2 Mixed hyperlipidemia L89.93 Pressure ulcer of unspecified site, stage 3 Wound Cleansing Wound #1 Medial Sacrum o Other: - please cleanse sacral wound with dakins moistened gauze - do not spray dakins on wound Anesthetic (add to Medication List) Wound #1 Medial Sacrum o Topical Lidocaine 4% cream applied to wound bed prior to debridement (In Clinic Only). Skin Barriers/Peri-Wound  Care Wound #1 Medial Sacrum o Skin Prep Primary Wound Dressing Wound #1 Medial Sacrum o Pack wound with: - Jessica [name] soaked gauze in clinic Secondary Dressing Wound #1 Medial Sacrum o ABD pad - secure with tape use until wound vac is placed back on wound Dressing Change Frequency Wound #1 Medial Sacrum o Change Dressing Monday, Wednesday, Friday Follow-up Appointments Wound #1 Medial Sacrum o Return Appointment in 2 weeks. Off-Loading Wound #1 Medial Sacrum IVYANNA, SIBERT. ([id number]) o Turn and reposition every 2 hours Additional Orders / Instructions Wound #1 Medial Sacrum o Increase protein intake. Home Health Wound #1 Medial Sacrum o Continue Home Health Visits - Advanced HHRN to start wound vac [facility] has instructed patient'[name] son to remove NPWT on Sundays to allow for Jessica [name] use between Sundays and Mondays when [facility] reapplies NPWT o Home Health Nurse may visit PRN to address patientos wound care needs. o FACE TO FACE ENCOUNTER: MEDICARE and MEDICAID PATIENTS: I certify that this patient is under my care and that I had a face-to-face encounter that meets the physician face-to-face encounter requirements with this patient on this date. The encounter with the patient was in whole or in part for the following MEDICAL CONDITION: (primary reason for Home Healthcare) MEDICAL NECESSITY: I certify, that based on my findings, NURSING services are a medically necessary home health service. HOME BOUND STATUS: I certify that my clinical findings support that this patient is homebound (i.e., Due to illness or injury, pt requires aid of supportive devices such as crutches, cane, wheelchairs, walkers, the use of special transportation or the assistance of another person to leave their place of residence. There is a normal inability to leave the home and doing so requires considerable and taxing effort. Other absences are for medical reasons /  religious services and are infrequent or of short duration when for other reasons). o If current dressing causes regression in wound condition, may Jessica [name]/C ordered dressing product/[name] and apply Normal Saline Moist Dressing daily until next Wound  Healing Center / Other MD appointment. Notify Wound Healing Center of regression in wound condition at [phone]. o Please direct any NON-WOUND related issues/requests for orders to patient'[name] Primary Care Physician Negative Pressure Wound Therapy Wound #1 Medial Sacrum o Wound VAC settings at 125/130 mmHg continuous pressure. Use BLACK/GREEN foam to wound cavity. Use WHITE foam to fill any tunnel/[name] and/or undermining. Change VAC dressing 3 X WEEK. Change canister as indicated when full. Nurse may titrate settings and frequency of dressing changes as clinically indicated. - PLEASE CONTINUE ANTIMICROBIAL GAUZE WITH NPWT (PLEASE MOISTEN GAUZE WITH NORMAL SALINE) HHRN to change on Mondays, Wednesdays and Fridays [facility] has instructed patient'[name] son to remove NPWT on Sundays to allow for Jessica [name] use between Sundays and Mondays when [facility] reapplies NPWT o Home Health Nurse may Jessica [name]/c VAC for [name]/[name] of increased infection, significant wound regression, or uncontrolled drainage. Notify Wound Healing Center at [phone]. o Number of foam/gauze pieces used in the dressing = Patient Medications Allergies: zinc, Ambien Notifications Medication Indication Start End triamcinolone acetonide 05/16/2018 DOSE topical 0.1 % cream - cream topical applied around the wound as directed for irritation Electronic Signature([name]) Signed: 05/16/2018 12:40:59 PM By: Lenda Kelp PA-C Previous Signature: 05/13/2018 5:12:31 PM Version By: Lenda Kelp PA-C Previous Signature: 05/13/2018 5:16:27 PM Version By: Curtis Sites Entered By: Lenda Kelp on 05/16/2018 12:40:58 Jessica [name] ([id number]) Elwyn Reach, Mellody Memos  ([id number]) -------------------------------------------------------------------------------- Problem List Details [name]. Date of Service: 05/13/2018 10:00 AM Medical Record Number: [id number] Patient Account Number: [id number] Date of Birth/Sex: 29-Jun-1945 (73 y.o. F) Treating RN: Curtis Sites Primary Care Provider: Annita Brod Other Clinician: Referring Provider: Annita Brod Treating Provider/Extender: STONE III, Brienna Bass Weeks in Treatment: 50 Active Problems ICD-10 Evaluated Encounter Code Description Active Date Today Diagnosis L89.154 Pressure ulcer of sacral region, stage 4 05/27/2017 No Yes M48.00 Spinal stenosis, site unspecified 05/27/2017 No Yes R54 Age-related physical debility 05/27/2017 No Yes G89.4 Chronic pain syndrome 05/27/2017 No Yes E78.2 Mixed hyperlipidemia 05/27/2017 No Yes L89.93 Pressure ulcer of unspecified site, stage 3 05/27/2017 No Yes Inactive Problems Resolved Problems Electronic Signature([name]) Signed: 05/13/2018 5:12:31 PM By: Lenda Kelp PA-C Entered By: Lenda Kelp on 05/13/2018 10:16:04 Jessica [name] ([id number]) -------------------------------------------------------------------------------- Progress Note Details [name]. Date of Service: 05/13/2018 10:00 AM Medical Record Number: [id number] Patient Account Number: [id number] Date of Birth/Sex: July 22, 1945 (73 y.o. F) Treating RN: Curtis Sites Primary Care Provider: Annita Brod Other Clinician: Referring Provider: Annita Brod Treating Provider/Extender: Linwood Dibbles, Shakayla Hickox Weeks in Treatment: 50 Subjective Chief Complaint Information obtained from Patient she is here for evaluation of a sacral ulcer and bilateral lower extremity ulcers History of Present Illness (HPI) 05/27/17-she is here in initial evaluation for a left-sided sacral stage IV pressure ulcer and bilateral lower extremity, lateral aspect, unstageable pressure ulcers.  She is accompanied by her husband and her son, who are her primary caregivers. She is bedbound secondary to spinal stenosis. According to her son and husband she was hospitalized from 10/5-11/1 with healthcare associated pneumonia and altered mental status. During her hospitalization she was intubated, extubated on 10/27. She was discharged with Foley catheter and follow up with urology. According to her son and spouse she developed the sacral ulcer during hospitalization. Home health has been applying Santyl daily. She does have a low air loss mattress and is repositioned every 2-3 hours per her family'[name] report. According to the son and spouse she had an appointment with  urology on 12/18 and during that appointment developed discoloration to her bilateral lower extremities which ultimately developed into unstageable pressure ulcers to the lateral aspects of her bilateral lower extremities. There has been no topical treatment applied to these. She continues to have home health. There is no concerns expressed regarding dietary intake, stating she eats 3 meals a day, eating was provided; she is supplemented with boost with protein. 06/03/17-she is here in follow-up evaluation for sacral and bilateral lower extremity ulcers. Plain film x-ray done today reveals no distraction to the sacrum or coccyx, no visible abnormalities. Home health has ordered the negative pressure wound system but it has not been initiated. We will continue with Hydrofera Blue until initiation of negative pressure wound system and continue with Santyl to bilateral lower extremity ulcers. Follow-up next week 06/10/17-she is here in follow-up evaluation for sacral and bilateral lower extremity ulcers. The wound VAC will be available tomorrow per home health. We will initiate wound VAC therapy to the sacral ulcer 3 times weekly (Thursday, Saturday/Sunday, Tuesday). We will continue with Santyl to the lower extremity ulcers. The  patient'[name] son is checking into home health therapy over the weekend for [facility] changes, with the understanding that if VAC changes cannot be performed over the weekend he will need to change his mother'[name] appointments to Monday, Wednesday or Friday. The sacral ulcer clinically does not appear infected but there has been a change in the amount of drainage acutely, there is no significant amount of devitalized tissue, there is no malodor. Wound culture was obtained to evaluate for occult infection we will hold off on antibiotic therapy until sensitivities are resulted. 06/18/17 on evaluation today patient appears to be doing fairly well in my opinion although this is the first time I have seen this patient she has been previously evaluated by Tacey Ruiz here in our office. She is going to be switching to Fridays to see me due to the Wound VAC schedule being changed on Monday, Wednesday, and Friday. Subsequently she seems to be doing fairly well with the Wound VAC. Her son who was present during evaluation today states that he somewhat stresses of the Wound VAC in making sure that it was functioning appropriately. With that being said everything seems to be working well he knows what settings on the [facility] itself to look at and ensure that it is functioning properly. Overall the wound appears to be nice and clean there is no need for debridement today. She has no discomfort in her bilateral lower extremity ulcers also appear to be improving based on measurements and what this honest tell me about the overall appearance. 07/02/17 on evaluation today I noted in patients wound bed that was actually an odor that had not previously been noted. Subsequently there was also a small area of bone that was not previously noted during my last evaluation. This did appear to be necrotic and was being somewhat forced out by the body around the region of granulation. She does not have any pain which is good news. Nonetheless the  overall appearance of the ulcer is making me concerned for the patient having developed osteomyelitis. Currently she is not been on any antibiotics and the Wound VAC has been doing fairly well in general. With that being said I do not think we need to continue the Wound VAC if she potentially has a bone infection. At least not until it is properly addressed with antibiotics. Jessica [name], Jessica [name] ([id number]) 07/09/17 on evaluation today patient appears to  actually be doing very well in regard to her sacral ulcer compared to last week. There is actually no exposed bone at this point. 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 With that being said still I do believe she needs to have a referral to infectious disease due to the osteomyelitis I am gonna recommend that she continue with the doxycycline based on the culture results which showed Eikenella Corrodens as the organism identified that tetracycline should work for. She does not seem to be having any discomfort whatsoever at this point. 07/16/17 on evaluation today patient actually appears to be doing rather well in regard to her sacral wound. I think that the original wound site actually appears much better than previously noted. With that being said she does have a new superficial injury on the right sacral region which appears to be due to according to the sign transport that occurred for her MRI unfortunately. Fortunately this is not too deep and I do think it can be managed but it was a new area that was not previously noted. Otherwise she has been tolerating the Jessica [name] soaked gauze packing without complication. 07/30/17 on evaluation today patient presents for reevaluation concerning her sacral wound. She has been tolerating the dressing changes without complication. With that being said  her wound is doing so well at this point that I think she may be at the point where we could reinitiate the Wound VAC currently and hopefully see good results from this. I'm very pleased with how she has responded to the [facility] [name] soaked gauze packing. 08/13/17 evaluation today patient appears to be doing excellent in regard to her lower extremity ulcer this seems to be cleaning up very nicely. In regard to the sacral ulcer the area of trauma on the right lateral portion of the wound actually appears to be much better than previously noted during the last evaluation. The word has definitely filled in and the Wound VAC appears to be helpful I do believe. Overall I'm very pleased with how things seem to be progressing at this time. Patient likewise is also happy that things are doing well. 08/27/17 on evaluation today patient presents for follow-up concerning her ongoing issues with her sacral ulcer and lower extremity ulcer. Fortunately she has been tolerating the dressing changes well complication the Wound VAC in general seems to have done very well up to this point. She does not have any evidence of infection which is good news. She does have some dark discoloration in central portion of the wound which is troublesome for the possibility of pressure injury to the site it sounds like her prior air mattress was not functioning properly her son has just bought a new one for her this is doing much better. Otherwise things seem to be progressing nicely. 09/10/17 on evaluation today patient presents for follow-up concerning her sacral wound and lower extremity ulcer. She has been tolerating the switch of her dressing changes to the Jessica [name] soaked gauze dressing very well in regard to the sacrum. The left lateral lower extremity ulcer appears to have a new area open just distal to the one that we have been treating with Santyl and this is new since her last visit. There does not appear to be any evidence of  infection which is good news. With that being said there'[name] a  lot of maceration here at the site and I feel this is likely due to the switch and the dressings it appears that due to the sticking of the dressings it will switch to utilizing a telfa pad over the Santyl. I think this caused a lot of drainage to be collected and situated in the region just under the dressing which has led to this causing which duration of breakdown. Overall there does not appear to be any significant pain which is good news. Of note I did actually have a conversation with the radiologist who is an interventional radiologist with [facility]. 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 With that being said there does not appear to be any rush to get this done according to the interventional radiologist whom I spoke with. Therefore after discussion it was determined that there gonna wait about six months prior to considering the procedure to get time for hopefully the sacral wound to heal in the interim to at least some degree. 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 According to nursing staff these are all improved, the negative pressure wound therapy system was put on hold last week. We will resume negative pressure wound therapy today, continue with Santyl to the lower extremity and she will follow-up next week. 09/24/17 on evaluation today unfortunately the patient'[name] wound appears to be doing significantly worse compared to last week'[name] evaluation and even my valuation the week before. She actually has bone noted on the left wound margin in the region of undermining unfortunately. This was not noted during the last evaluation. I am concerned about the fact that this seems to be worse not better since our last visit with her. My biggest concern is that she'[name] likely developing a  worsening osteomyelitis. This was discussed with patient and her son today during the office visit. 05/03/17 on evaluation today patient actually appears to be doing rather well in regard to her sacral ulcer compared to last week'[name] evaluation. She has been tolerating the dressing changes without complication. Fortunately even though we're not utilizing the Wound VAC she seems to be making some strides in seeing the wound area overall improved quite dramatically in my pinion in just one weeks time. She also seems to be staying off of this to the point that I do not see any evidence of new injury which is excellent news. Whatever she'[name] been doing in that regard over the past week I would want her to continue. STHEFANY, SALIBA ([id number]) did also review the results of her bone culture which revealed group B strep as the responsible organism. Again this is what'[name] causing her osteomyelitis she does have infectious disease appointment scheduled for 10/11/17 10/08/1898 valuation today patient appears to be doing better for the most part in regard to the sacral wound. Overall she is showing signs of having granulation of the bone which is good news. There is an area towards the left of the wound where she does seem to be showing some signs of opening this it would be due to additional pressure to the area unfortunately. There does not appear to be any evidence of infection spreading which is good news that do not see any evidence of significant purulent discharge which is also good news. 10/26/17 on evaluation today patient sacral ulcer actually appears to be very healthy and doing well in regard to the  overall appearance of the wound. With that being said she does seem to be having some issues at this point in time with some shear/friction injury of the sacrum from where she was transported to [facility] for her infectious disease appointment. She has been placed on IV antibiotic therapy I will have to  go back and find that note for review as I did not have access to that today. Nonetheless I will add the next visit be sure to document the exact antibiotics that she is taking at this point. Nonetheless I do believe the wound is doing better there'[name] no bone exposure nothing that requires debridement in regard to the sacral wound. Likewise her left lateral lower extremity ulcer also appears to be doing significantly better. In general I feel like things are showing signs of improvement all around. 11/12/17 on evaluation today patient appears to be doing rather well in regard to her sacral wound. In general I do see signs of granulation at this point. Fortunately there does not appear to be any evidence of infection which is good news as well. In general overall happy with the appearance. With that being said I'll be see I would like for this to be progressing more rapidly as with the patient and her son but nonetheless at least things do not appear to be doing worse. Her lower extremity ulcer is doing significantly better. 11/26/17 on evaluation today patient appears to be doing a little better in regard to the sacral wound especially in the peripheral of the wound bed. She actually did have an abrasion on the right gluteal region that'[name] completely resolved to this point. Her lower extremity also appears to be for the most part completely resolved. Overall the sacrum itself seems to be the one thing that is still open and given her trouble. No fevers, chills, nausea, or vomiting noted at this time. She actually has an appointment next week with infectious disease for recheck to see how things are doing. She maybe switch to oral medications at that point. 12/09/17 on evaluation today patient actually appears to be doing much better in regard to her sacral wound. I do feel like she has made good progress at this time as far as healing is concerned. In fact the wound looks better to me today than it has  in quite some time. She is on oral antibiotic therapy and no longer on the IV antibiotics. She did see infectious disease last week. 12/24/17 on evaluation today patient'[name] wounds actually appear to be doing decently well in regard to the sacral wound in particular. When it comes to her lower extremity ulcers both appear to be completely healed at this point in the one area where she had lived the rash has completely resolved at this time. Overall very pleased with the progress that has been made. Nonetheless the patient seems to be doing well in general. She still does have quite a bit of healing to do in regard to the sacral wound but I feel like she is making progress. 01/07/18 on evaluation today patient actually appears to be doing excellent today regard to her sacral wound. The granular quality in the base of the wound is excellent and shown signs of good improvement there'[name] no evidence of contusion nor deep tissue injury and the periwound actually appears to be doing well. In general I'm very pleased with the overall appearance. 02/04/18 on evaluation today patient'[name] wound actually appears to be doing decently well as far as the granulation  is concerned. She has been tolerating the dressing changes without complication. Right now we are still using the Wound VAC. Nonetheless overall there apparently is some cater to the wound and this is concerning simply for the fact that she could be developing a soft tissue infection again. She'[name] had this happen in the past and at that time responded very well to the antibiotics. Nonetheless I don't think I would likely put her on anything prophylactically but rather wait for the results of the culture to return. 02/18/18 on evaluation today patient actually appears to be doing fairly well. She has been tolerating the dressing changes. And the Wound VAC seems to be doing well. Overall I'm very pleased with how things have improved over the past couple weeks  since I last saw her. Fortunately there does not appear to be any evidence of overt infection at this time which is good news. She has been taking the antibiotics without complication. Specifically that is the Bactrim DS which was prescribed on 02/08/18. 03/18/18 on evaluation today patient'[name] wound actually does have quite a bit of odor noted compared to previous. With that EVER, HALBERG. ([id number]) being said the wound itself overall does not appear to be too significant or worse. There is no event noted in the wound bed which is good news. With that being said the patient has been worried as the home health nurse stated that she thought she saw bone in the base of the wound. Nonetheless there'[name] a little bit of bruising along the left lateral border of the wound bed which again does not appear to be terrible we have noticed this before but other than this the majority the wound bed appears to be doing excellent in my pinion. I am wondering if there may be some bacterial colonization. This could be leading to some of the odor that we are noting with the Wound VAC. 04/01/18 on evaluation today patient appears to actually be doing well in regard to the overall appearance of the wound itself. She also does not have any odor at this point in regard to the wound surface and I do believe the Bacons is extremely helpful in this regard. With that being said she does have a little bit of deep tissue injury on the medial aspect of the wound. Again last time it was more lateral. The lateral seems to have improved quite well. Nonetheless she also notes by way of her son that this has been very macerated in the past several weeks since I last saw her. Though things overall seem to be doing better I still think that the Wound VAC may help with managing her fluid better I'm wondering if we can attempt a gauze Wound VAC my hope is that this will be able to manage the moisture control as well as continuing with  managing the odor of the wound and bacterial load since the Jessica [name] has done excellent in helping in that regard. 04/15/17 on evaluation today patient actually appears to be doing much better in regard to the overall appearance of her sacral wound. I do believe that the gauze Vac has been of benefit for her. Overall she is tolerating this without complication which is also good news. There is no sign of injury to the sacral region at this point. 04/29/18 on evaluation today patient appears to be doing well in regard to her sacral ulcer. She sure signs of good granulation and the Gauze VAC is doing very well for her.  Overall I'm pleased in this regard. The biggest issue is that the time from Friday till Monday seems to be quite long for her as far as the amount of time to keep the Wound VAC sealed. For that reason her son wonders if he can take this off on Sunday morning in order to give her day of just wet to dry dressing'[name] with the Jessica [name] solution I think this would be okay. 05/13/18 on evaluation today patient appears to be doing okay in regard to her sacral ulcer. There does not appear to be any evidence of overt infection at this time which is good news. In general the drainage and moisture appears to be significantly improved this was after the pressure on the Wound VAC was increased after they contacted our office. With that being said a lot of the dark tissue around the edge of the wound has improved in the maceration is significantly better this seems to be managing much better. Fortunately there is no signs of infection at this time. Overall the patient is doing quite well. The wound measurements are not significantly smaller today although they were over the past two visits we will see how things go. Patient History Information obtained from Patient. Family History No family history of Cancer, Diabetes, Heart Disease. Social History Never smoker, Marital Status - Married. Review of  Systems (ROS) Constitutional Symptoms (General Health) Denies complaints or symptoms of Fever, Chills. Respiratory The patient has no complaints or symptoms. Cardiovascular The patient has no complaints or symptoms. Psychiatric The patient has no complaints or symptoms. Jessica BradyOVERMAN, [name] ([id number]) Objective Constitutional Well-nourished and well-hydrated in no acute distress. Vitals Time Taken: 10:15 AM, Height: 70 in, Temperature: 98.5 F, Pulse: 78 bpm, Respiratory Rate: 16 breaths/min, Blood Pressure: 144/63 mmHg. Respiratory normal breathing without difficulty. clear to auscultation bilaterally. Cardiovascular regular rate and rhythm with normal S1, S2. Psychiatric this patient is able to make decisions and demonstrates good insight into disease process. Alert and Oriented x 3. pleasant and cooperative. General Notes: Patient'[name] wound bed currently shows evidence of good granulation which is minimal Slough noted in scattered locations. There'[name] no signs of overt infection which is good news. Overall I'm rather pleased with how things seem to be progressing in that regard. We just need to make sure that things continue to improve from the standpoint of size. Integumentary (Hair, Skin) Wound #1 status is Open. Original cause of wound was Pressure Injury. The wound is located on the Medial Sacrum. The wound measures 7.9cm length x 3.1cm width x 2cm depth; 19.234cm^2 area and 38.469cm^3 volume. There is muscle, Fat Layer (Subcutaneous Tissue) Exposed, and fascia exposed. There is no tunneling or undermining noted. There is a medium amount of sanguinous drainage noted. Foul odor after cleansing was noted. The wound margin is distinct with the outline attached to the wound base. There is large (67-100%) red, pink, hyper - granulation within the wound bed. There is a small (1-33%) amount of necrotic tissue within the wound bed including Adherent Slough. The periwound skin  appearance exhibited: Induration, Scarring, Maceration, Rubor, Erythema. The periwound skin appearance did not exhibit: Callus, Crepitus, Excoriation, Rash, Dry/Scaly, Atrophie Blanche, Cyanosis, Ecchymosis, Hemosiderin Staining, Mottled, Pallor. The surrounding wound skin color is noted with erythema which is circumferential. Periwound temperature was noted as No Abnormality. The periwound has tenderness on palpation. Assessment Active Problems ICD-10 Pressure ulcer of sacral region, stage 4 Spinal stenosis, site unspecified Age-related physical debility Chronic pain syndrome Mixed hyperlipidemia Pressure ulcer of  unspecified site, stage [address] HATSUE, SIME. ([id number]) Wound Cleansing: Wound #1 Medial Sacrum: Other: - please cleanse sacral wound with dakins moistened gauze - do not spray dakins on wound Anesthetic (add to Medication List): Wound #1 Medial Sacrum: Topical Lidocaine 4% cream applied to wound bed prior to debridement (In Clinic Only). Skin Barriers/Peri-Wound Care: Wound #1 Medial Sacrum: Skin Prep Primary Wound Dressing: Wound #1 Medial Sacrum: Pack wound with: - Jessica [name] soaked gauze in clinic Secondary Dressing: Wound #1 Medial Sacrum: ABD pad - secure with tape use until wound vac is placed back on wound Dressing Change Frequency: Wound #1 Medial Sacrum: Change Dressing Monday, Wednesday, Friday Follow-up Appointments: Wound #1 Medial Sacrum: Return Appointment in 2 weeks. Off-Loading: Wound #1 Medial Sacrum: Turn and reposition every 2 hours Additional Orders / Instructions: Wound #1 Medial Sacrum: Increase protein intake. Home Health: Wound #1 Medial Sacrum: Continue Home Health Visits - Advanced HHRN to start wound vac [facility] has instructed patient'[name] son to remove NPWT on Sundays to allow for Jessica [name] use between Sundays and Mondays when [facility] reapplies NPWT Home Health Nurse may visit PRN to address patient [name] wound care  needs. FACE TO FACE ENCOUNTER: MEDICARE and MEDICAID PATIENTS: I certify that this patient is under my care and that I had a face-to-face encounter that meets the physician face-to-face encounter requirements with this patient on this date. The encounter with the patient was in whole or in part for the following MEDICAL CONDITION: (primary reason for Home Healthcare) MEDICAL NECESSITY: I certify, that based on my findings, NURSING services are a medically necessary home health service. HOME BOUND STATUS: I certify that my clinical findings support that this patient is homebound (i.e., Due to illness or injury, pt requires aid of supportive devices such as crutches, cane, wheelchairs, walkers, the use of special transportation or the assistance of another person to leave their place of residence. There is a normal inability to leave the home and doing so requires considerable and taxing effort. Other absences are for medical reasons / religious services and are infrequent or of short duration when for other reasons). If current dressing causes regression in wound condition, may Jessica [name]/C ordered dressing product/[name] and apply Normal Saline Moist Dressing daily until next Wound Healing Center / Other MD appointment. Notify Wound Healing Center of regression in wound condition at [phone]. Please direct any NON-WOUND related issues/requests for orders to patient'[name] Primary Care Physician Negative Pressure Wound Therapy: Wound #1 Medial Sacrum: Wound VAC settings at 125/130 mmHg continuous pressure. Use BLACK/GREEN foam to wound cavity. Use WHITE foam to fill any tunnel/[name] and/or undermining. Change VAC dressing 3 X WEEK. Change canister as indicated when full. Nurse may titrate settings and frequency of dressing changes as clinically indicated. - PLEASE CONTINUE ANTIMICROBIAL GAUZE WITH NPWT (PLEASE MOISTEN GAUZE WITH NORMAL SALINE) HHRN to change on Mondays, Wednesdays and Fridays [name] has instructed patient'[name] son to remove NPWT on Sundays to allow for Jessica [name] use between Sundays and Mondays when [facility] reapplies NPWT Home Health Nurse may Jessica [name]/c VAC for [name]/[name] of increased infection, significant wound regression, or uncontrolled drainage. Notify Wound Healing Center at [phone]. Number of foam/gauze pieces used in the dressing = The following medication([name]) was prescribed: triamcinolone acetonide topical 0.1 % cream cream topical applied around the wound as directed for irritation starting 05/16/2018 Jessica [name], KOHN. ([id number]) Patient'[name] wound bed currently shows signs of good granulation at this point. Overall I'm very pleased with how things  are going although I would like for the wound have measured quite a bit smaller today still this is the first week over the past three times we've seen her that it has not measured smaller. We will keep a close eye on this. However if it looks like things level out or not really improving we may need to discontinue Wound VAC therapy. For that reason I did discuss with her today the possibility of seeing a plastic surgeon to discuss anything from a surgical standpoint it could be done to fix this region. With that being said this is something that seems to somewhat scare her which was definitely not my intention. I do believe however that it would be a good idea just to be complete and ensure that we have all of her options in place one way or another. She decides that that'[name] not something she would want to do that I would completely respect her wishes and that regard. Otherwise we will see her back for follow-up in two weeks to see were things stand. Please see above for specific wound care orders. We will see patient for re-evaluation in 2 week([name]) here in the clinic. If anything worsens or changes patient will contact our office for additional recommendations. Electronic Signature([name]) Signed: 05/16/2018 1:40:28 PM By: Lenda Kelp  PA-C Previous Signature: 05/13/2018 5:12:31 PM Version By: Lenda Kelp PA-C Entered By: Lenda Kelp on 05/16/2018 12:41:18 Jessica [name] ([id number]) -------------------------------------------------------------------------------- ROS/PFSH Details [name]. Date of Service: 05/13/2018 10:00 AM Medical Record Number: [id number] Patient Account Number: [id number] Date of Birth/Sex: 1946-01-20 (73 y.o. F) Treating RN: Curtis Sites Primary Care Provider: Annita Brod Other Clinician: Referring Provider: Annita Brod Treating Provider/Extender: STONE III, Ella Golomb Weeks in Treatment: 50 Information Obtained From Patient Wound History Constitutional Symptoms (General Health) Complaints and Symptoms: Negative for: Fever; Chills Respiratory Complaints and Symptoms: No Complaints or Symptoms Cardiovascular Complaints and Symptoms: No Complaints or Symptoms Psychiatric Complaints and Symptoms: No Complaints or Symptoms Immunizations Pneumococcal Vaccine: Received Pneumococcal Vaccination: No Implantable Devices Family and Social History Cancer: No; Diabetes: No; Heart Disease: No; Never smoker; Marital Status - Married Physician Affirmation I have reviewed and agree with the above information. Electronic Signature([name]) Signed: 05/13/2018 5:12:31 PM By: Lenda Kelp PA-C Signed: 05/13/2018 5:16:27 PM By: Curtis Sites Entered By: Lenda Kelp on 05/13/2018 11:14:37 Jessica [name] ([id number]) -------------------------------------------------------------------------------- SuperBill Details [name]. Date of Service: 05/13/2018 Medical Record Number: [id number] Patient Account Number: [id number] Date of Birth/Sex: Jun 14, 1945 (73 y.o. F) Treating RN: Curtis Sites Primary Care Provider: Annita Brod Other Clinician: Referring Provider: Annita Brod Treating Provider/Extender: STONE III, Enez Monahan Weeks in  Treatment: 50 Diagnosis Coding ICD-10 Codes Code Description L89.154 Pressure ulcer of sacral region, stage 4 M48.00 Spinal stenosis, site unspecified R54 Age-related physical debility G89.4 Chronic pain syndrome E78.2 Mixed hyperlipidemia L89.93 Pressure ulcer of unspecified site, stage 3 Facility Procedures CPT4 Code: 62952841 Description: 99213 - WOUND CARE VISIT-LEV 3 EST PT Modifier: Quantity: 1 Physician Procedures CPT4 Code: 3244010 Description: 99214 - WC PHYS LEVEL 4 - EST PT ICD-10 Diagnosis Description L89.154 Pressure ulcer of sacral region, stage 4 M48.00 Spinal stenosis, site unspecified R54 Age-related physical debility G89.4 Chronic pain syndrome Modifier: Quantity: 1 Electronic Signature([name]) Signed: 05/13/2018 5:12:31 PM By: Lenda Kelp PA-C Entered By: Lenda Kelp on 05/13/2018 11:16:44

## 2018-05-15 NOTE — Progress Notes (Signed)
WILLO, YOON ([id number]) Visit Report for 05/13/2018 Arrival Information Details [name], Jessica [name]. Date of Service: 05/13/2018 10:00 AM Medical Record Number: [id number] Patient Account Number: [id number] Date of Birth/Sex: 05/07/1945 (73 y.o. F) Treating RN: Rema Jasmine Primary Care Alexys Gassett: Annita Brod Other Clinician: Referring Tichina Koebel: Annita Brod Treating Caterina Racine/Extender: STONE III, HOYT Weeks in Treatment: 50 Visit Information History Since Last Visit Added or deleted any medications: No Patient Arrived: Stretcher Any new allergies or adverse reactions: No Arrival Time: 10:14 Had a fall or experienced change in No Accompanied By: son activities of daily living that may affect Transfer Assistance: Other risk of falls: Patient Has Alerts: Yes Signs or symptoms of abuse/neglect since last visito No Patient Alerts: ALLERGIC TO ZINC Hospitalized since last visit: No Implantable device outside of the clinic excluding No cellular tissue based products placed in the center since last visit: Has Dressing in Place as Prescribed: Yes Pain Present Now: No Electronic Signature(s) Signed: 05/13/2018 11:55:35 AM By: Rema Jasmine Entered By: Rema Jasmine on 05/13/2018 10:14:59 Jessica [name] ([id number]) -------------------------------------------------------------------------------- Clinic Level of Care Assessment Details [name]. Date of Service: 05/13/2018 10:00 AM Medical Record Number: [id number] Patient Account Number: [id number] Date of Birth/Sex: 1946/01/01 (73 y.o. F) Treating RN: Curtis Sites Primary Care Jessly Lebeck: Annita Brod Other Clinician: Referring Day Deery: Annita Brod Treating Brenleigh Collet/Extender: STONE III, HOYT Weeks in Treatment: 50 Clinic Level of Care Assessment Items TOOL 4 Quantity Score []  - Use when only an EandM is performed on FOLLOW-UP visit 0 ASSESSMENTS - Nursing Assessment / Reassessment X -  Reassessment of Co-morbidities (includes updates in patient status) 1 10 X- 1 5 Reassessment of Adherence to Treatment Plan ASSESSMENTS - Wound and Skin Assessment / Reassessment X - Simple Wound Assessment / Reassessment - one wound 1 5 []  - 0 Complex Wound Assessment / Reassessment - multiple wounds []  - 0 Dermatologic / Skin Assessment (not related to wound area) ASSESSMENTS - Focused Assessment []  - Circumferential Edema Measurements - multi extremities 0 []  - 0 Nutritional Assessment / Counseling / Intervention []  - 0 Lower Extremity Assessment (monofilament, tuning fork, pulses) []  - 0 Peripheral Arterial Disease Assessment (using hand held doppler) ASSESSMENTS - Ostomy and/or Continence Assessment and Care []  - Incontinence Assessment and Management 0 []  - 0 Ostomy Care Assessment and Management (repouching, etc.) PROCESS - Coordination of Care X - Simple Patient / Family Education for ongoing care 1 15 []  - 0 Complex (extensive) Patient / Family Education for ongoing care X- 1 10 Staff obtains Chiropractor, Records, Test Results / Process Orders []  - 0 Staff telephones HHA, Nursing Homes / Clarify orders / etc []  - 0 Routine Transfer to another Facility (non-emergent condition) []  - 0 Routine Hospital Admission (non-emergent condition) []  - 0 New Admissions / Manufacturing engineer / Ordering NPWT, Apligraf, etc. []  - 0 Emergency Hospital Admission (emergent condition) X- 1 10 Simple Discharge Coordination LAURENA, VALKO. ([id number]) []  - 0 Complex (extensive) Discharge Coordination PROCESS - Special Needs []  - Pediatric / Minor Patient Management 0 []  - 0 Isolation Patient Management []  - 0 Hearing / Language / Visual special needs []  - 0 Assessment of Community assistance (transportation, D/C planning, etc.) []  - 0 Additional assistance / Altered mentation []  - 0 Support Surface(s) Assessment (bed, cushion, seat, etc.) INTERVENTIONS - Wound Cleansing  / Measurement X - Simple Wound Cleansing - one wound 1 5 []  - 0 Complex Wound Cleansing - multiple wounds X- 1 5 Wound Imaging (  photographs - any number of wounds) []  - 0 Wound Tracing (instead of photographs) X- 1 5 Simple Wound Measurement - one wound []  - 0 Complex Wound Measurement - multiple wounds INTERVENTIONS - Wound Dressings X - Small Wound Dressing one or multiple wounds 1 10 []  - 0 Medium Wound Dressing one or multiple wounds []  - 0 Large Wound Dressing one or multiple wounds X- 1 5 Application of Medications - topical []  - 0 Application of Medications - injection INTERVENTIONS - Miscellaneous []  - External ear exam 0 []  - 0 Specimen Collection (cultures, biopsies, blood, body fluids, etc.) []  - 0 Specimen(s) / Culture(s) sent or taken to Lab for analysis []  - 0 Patient Transfer (multiple staff / Nurse, adultHoyer Lift / Similar devices) []  - 0 Simple Staple / Suture removal (25 or less) []  - 0 Complex Staple / Suture removal (26 or more) []  - 0 Hypo / Hyperglycemic Management (close monitor of Blood Glucose) []  - 0 Ankle / Brachial Index (ABI) - do not check if billed separately X- 1 5 Vital Signs Klas, Charon W. ([id number]) Has the patient been seen at the hospital within the last three years: Yes Total Score: 90 Level Of Care: New/Established - Level 3 Electronic Signature(s) Signed: 05/13/2018 5:16:27 PM By: Curtis Sitesorthy, Joanna Entered By: Curtis Sitesorthy, Joanna on 05/13/2018 11:00:19 Jessica [name], [name] ([id number]) -------------------------------------------------------------------------------- Encounter Discharge Information Details [name], [name] Date of Service: 05/13/2018 10:00 AM Medical Record Number: [id number] Patient Account Number: [id number] Date of Birth/Sex: April 09, 1946 (73 y.o. F) Treating RN: Curtis Sitesorthy, Joanna Primary Care Haylee Mcanany: Annita BrodASENSO, PHILIP Other Clinician: Referring Cahlil Sattar: Annita BrodASENSO, PHILIP Treating Isabell Bonafede/Extender: Linwood DibblesSTONE III,  HOYT Weeks in Treatment: 50 Encounter Discharge Information Items Discharge Condition: Stable Ambulatory Status: Stretcher Discharge Destination: Home Transportation: Ambulance Accompanied By: son and EMS Schedule Follow-up Appointment: Yes Clinical Summary of Care: Electronic Signature(s) Signed: 05/13/2018 5:16:27 PM By: Curtis Sitesorthy, Joanna Entered By: Curtis Sitesorthy, Joanna on 05/13/2018 11:01:03 Jessica [name], [name] ([id number]) -------------------------------------------------------------------------------- Lower Extremity Assessment Details [name], [name] Date of Service: 05/13/2018 10:00 AM Medical Record Number: [id number] Patient Account Number: [id number] Date of Birth/Sex: April 09, 1946 (73 y.o. F) Treating RN: Rema JasmineNg, Wendi Primary Care Dartanyan Deasis: Annita BrodASENSO, PHILIP Other Clinician: Referring Nyala Kirchner: Annita BrodASENSO, PHILIP Treating Mallerie Blok/Extender: Linwood DibblesSTONE III, HOYT Weeks in Treatment: 50 Electronic Signature(s) Signed: 05/13/2018 11:55:35 AM By: Rema JasmineNg, Wendi Entered By: Rema JasmineNg, Wendi on 05/13/2018 10:28:22 Jessica [name], [name] ([id number]) -------------------------------------------------------------------------------- Multi Wound Chart Details [name], Miliana W. Date of Service: 05/13/2018 10:00 AM Medical Record Number: [id number] Patient Account Number: [id number] Date of Birth/Sex: April 09, 1946 (73 y.o. F) Treating RN: Curtis Sitesorthy, Joanna Primary Care Natanel Snavely: Annita BrodASENSO, PHILIP Other Clinician: Referring Brylea Pita: Annita BrodASENSO, PHILIP Treating Yehudit Fulginiti/Extender: STONE III, HOYT Weeks in Treatment: 50 Vital Signs Height(in): 70 Pulse(bpm): 78 Weight(lbs): Blood Pressure(mmHg): 144/63 Body Mass Index(BMI): Temperature(F): 98.5 Respiratory Rate 16 (breaths/min): Photos: [N/A:N/A] Wound Location: Sacrum - Medial N/A N/A Wounding Event: Pressure Injury N/A N/A Primary Etiology: Pressure Ulcer N/A N/A Date Acquired: 01/15/2017 N/A N/A Weeks of Treatment: 50 N/A N/A Wound Status:  Open N/A N/A Measurements L x W x D 7.9x3.1x2 N/A N/A (cm) Area (cm) : 19.234 N/A N/A Volume (cm) : 38.469 N/A N/A % Reduction in Area: 47.80% N/A N/A % Reduction in Volume: -4.40% N/A N/A Classification: Category/Stage IV N/A N/A Exudate Amount: Medium N/A N/A Exudate Type: Sanguinous N/A N/A Exudate Color: red N/A N/A Foul Odor After Cleansing: Yes N/A N/A Odor Anticipated Due to No N/A N/A Product Use: Wound Margin: Distinct, outline attached N/A  N/A Granulation Amount: Large (67-100%) N/A N/A Granulation Quality: Red, Pink, Hyper-granulation N/A N/A Necrotic Amount: Small (1-33%) N/A N/A Exposed Structures: Fascia: Yes N/A N/A Fat Layer (Subcutaneous Tissue) Exposed: Yes Muscle: Yes Tendon: No Jessica [name], [name] ([id number]) Joint: No Bone: No Epithelialization: None N/A N/A Periwound Skin Texture: Induration: Yes N/A N/A Scarring: Yes Excoriation: No Callus: No Crepitus: No Rash: No Periwound Skin Moisture: Maceration: Yes N/A N/A Dry/Scaly: No Periwound Skin Color: Erythema: Yes N/A N/A Rubor: Yes Atrophie Blanche: No Cyanosis: No Ecchymosis: No Hemosiderin Staining: No Mottled: No Pallor: No Erythema Location: Circumferential N/A N/A Temperature: No Abnormality N/A N/A Tenderness on Palpation: Yes N/A N/A Wound Preparation: Ulcer Cleansing: N/A N/A Rinsed/Irrigated with Saline Topical Anesthetic Applied: Other: lidocaine 4% Treatment Notes Electronic Signature(s) Signed: 05/13/2018 5:16:27 PM By: Curtis Sitesorthy, Joanna Entered By: Curtis Sitesorthy, Joanna on 05/13/2018 10:51:18 Jessica [name], Kaliope W. ([id number]) -------------------------------------------------------------------------------- Multi-Disciplinary Care Plan Details [name], Arayla W. Date of Service: 05/13/2018 10:00 AM Medical Record Number: [id number] Patient Account Number: [id number] Date of Birth/Sex: Apr 11, 1946 (73 y.o. F) Treating RN: Curtis Sitesorthy, Joanna Primary Care Shineka Auble: Annita BrodASENSO,  PHILIP Other Clinician: Referring Nashiya Disbrow: Annita BrodASENSO, PHILIP Treating Zakaiya Lares/Extender: STONE III, HOYT Weeks in Treatment: 50 Active Inactive Orientation to the Wound Care Program Nursing Diagnoses: Knowledge deficit related to the wound healing center program Goals: Patient/caregiver will verbalize understanding of the Wound Healing Center Program Date Initiated: 06/03/2017 Target Resolution Date: 06/17/2017 Goal Status: Active Interventions: Provide education on orientation to the wound center Notes: Pressure Nursing Diagnoses: Knowledge deficit related to causes and risk factors for pressure ulcer development Knowledge deficit related to management of pressures ulcers Potential for impaired tissue integrity related to pressure, friction, moisture, and shear Goals: Patient will remain free from development of additional pressure ulcers Date Initiated: 06/03/2017 Target Resolution Date: 06/17/2017 Goal Status: Active Patient will remain free of pressure ulcers Date Initiated: 06/03/2017 Target Resolution Date: 06/17/2017 Goal Status: Active Patient/caregiver will verbalize risk factors for pressure ulcer development Date Initiated: 06/03/2017 Target Resolution Date: 06/17/2017 Goal Status: Active Interventions: Assess: immobility, friction, shearing, incontinence upon admission and as needed Assess potential for pressure ulcer upon admission and as needed Notes: Wound/Skin Impairment Jessica [name], Teliah W. ([id number]) Nursing Diagnoses: Impaired tissue integrity Goals: Patient/caregiver will verbalize understanding of skin care regimen Date Initiated: 06/03/2017 Target Resolution Date: 06/17/2017 Goal Status: Active Ulcer/skin breakdown will have a volume reduction of 30% by week 4 Date Initiated: 06/03/2017 Target Resolution Date: 06/17/2017 Goal Status: Active Interventions: Assess ulceration(s) every visit Treatment Activities: Patient referred to home care : 06/03/2017 Skin care  regimen initiated : 06/03/2017 Notes: Electronic Signature(s) Signed: 05/13/2018 5:16:27 PM By: Curtis Sitesorthy, Joanna Entered By: Curtis Sitesorthy, Joanna on 05/13/2018 10:51:10 Jessica [name], Luca W. ([id number]) -------------------------------------------------------------------------------- Pain Assessment Details [name], Geral W. Date of Service: 05/13/2018 10:00 AM Medical Record Number: [id number] Patient Account Number: [id number] Date of Birth/Sex: Apr 11, 1946 (73 y.o. F) Treating RN: Rema JasmineNg, Wendi Primary Care Justo Hengel: Annita BrodASENSO, PHILIP Other Clinician: Referring Kali Ambler: Annita BrodASENSO, PHILIP Treating Eilis Chestnutt/Extender: STONE III, HOYT Weeks in Treatment: 50 Active Problems Location of Pain Severity and Description of Pain Patient Has Paino No Site Locations Pain Management and Medication Current Pain Management: Notes pt denies any pain at this time. Electronic Signature(s) Signed: 05/13/2018 11:55:35 AM By: Rema JasmineNg, Wendi Entered By: Rema JasmineNg, Wendi on 05/13/2018 10:15:18 Jessica [name], Editha W. ([id number]) -------------------------------------------------------------------------------- Patient/Caregiver Education Details [name], Citlali W. Date of Service: 05/13/2018 10:00 AM Medical Record Number: [id number] Patient Account Number: [id number] Date of Birth/Gender: Apr 11, 1946 (73 y.o. F) Treating  RN: Curtis Sites Primary Care Physician: Annita Brod Other Clinician: Referring Physician: Annita Brod Treating Physician/Extender: Linwood Dibbles, HOYT Weeks in Treatment: 50 Education Assessment Education Provided To: Patient and Caregiver Education Topics Provided Wound/Skin Impairment: Handouts: Other: possibility of plastic surgery referral Methods: Explain/Verbal Responses: State content correctly Electronic Signature(s) Signed: 05/13/2018 5:16:27 PM By: Curtis Sites Entered By: Curtis Sites on 05/13/2018 11:01:26 Jessica [name]  ([id number]) -------------------------------------------------------------------------------- Wound Assessment Details [name]. Date of Service: 05/13/2018 10:00 AM Medical Record Number: [id number] Patient Account Number: [id number] Date of Birth/Sex: 13-Apr-1946 (73 y.o. F) Treating RN: Rema Jasmine Primary Care Vertis Bauder: Annita Brod Other Clinician: Referring Riyad Keena: Annita Brod Treating Chalese Peach/Extender: STONE III, HOYT Weeks in Treatment: 50 Wound Status Wound Number: 1 Primary Etiology: Pressure Ulcer Wound Location: Sacrum - Medial Wound Status: Open Wounding Event: Pressure Injury Date Acquired: 01/15/2017 Weeks Of Treatment: 50 Clustered Wound: No Photos Wound Measurements Length: (cm) 7.9 Width: (cm) 3.1 Depth: (cm) 2 Area: (cm) 19.234 Volume: (cm) 38.469 % Reduction in Area: 47.8% % Reduction in Volume: -4.4% Epithelialization: None Tunneling: No Undermining: No Wound Description Classification: Category/Stage IV Foul Odor Wound Margin: Distinct, outline attached Due to Pro Exudate Amount: Medium Slough/Fib Exudate Type: Sanguinous Exudate Color: red After Cleansing: Yes duct Use: No rino Yes Wound Bed Granulation Amount: Large (67-100%) Exposed Structure Granulation Quality: Red, Pink, Hyper-granulation Fascia Exposed: Yes Necrotic Amount: Small (1-33%) Fat Layer (Subcutaneous Tissue) Exposed: Yes Necrotic Quality: Adherent Slough Tendon Exposed: No Muscle Exposed: Yes Necrosis of Muscle: No Joint Exposed: No Bone Exposed: No Periwound Skin Texture MELANY, WIESMAN. ([id number]) Texture Color No Abnormalities Noted: No No Abnormalities Noted: No Callus: No Atrophie Blanche: No Crepitus: No Cyanosis: No Excoriation: No Ecchymosis: No Induration: Yes Erythema: Yes Rash: No Erythema Location: Circumferential Scarring: Yes Hemosiderin Staining: No Mottled: No Moisture Pallor: No No Abnormalities Noted:  No Rubor: Yes Dry / Scaly: No Maceration: Yes Temperature / Pain Temperature: No Abnormality Tenderness on Palpation: Yes Wound Preparation Ulcer Cleansing: Rinsed/Irrigated with Saline Topical Anesthetic Applied: Other: lidocaine 4%, Treatment Notes Wound #1 (Medial Sacrum) Notes HHRN to replace wound vac once gauze is available for use under NPWT, Vashe soaked gauze with ABD pad on top secured with tape Electronic Signature(s) Signed: 05/13/2018 11:55:35 AM By: Rema Jasmine Signed: 05/13/2018 5:16:27 PM By: Curtis Sites Entered By: Curtis Sites on 05/13/2018 10:50:59 Jessica [name] ([id number]) -------------------------------------------------------------------------------- Vitals Details [name]. Date of Service: 05/13/2018 10:00 AM Medical Record Number: [id number] Patient Account Number: [id number] Date of Birth/Sex: 1945/08/15 (73 y.o. F) Treating RN: Rema Jasmine Primary Care Jonathan Kirkendoll: Annita Brod Other Clinician: Referring Wanetta Funderburke: Annita Brod Treating Traeton Bordas/Extender: STONE III, HOYT Weeks in Treatment: 50 Vital Signs Time Taken: 10:15 Temperature (F): 98.5 Height (in): 70 Pulse (bpm): 78 Respiratory Rate (breaths/min): 16 Blood Pressure (mmHg): 144/63 Reference Range: 80 - 120 mg / dl Airway Electronic Signature(s) Signed: 05/13/2018 11:55:35 AM By: Rema Jasmine Entered ByRema Jasmine on 05/13/2018 10:18:07

## 2018-05-27 ENCOUNTER — Encounter: Payer: Medicare Other | Attending: Physician Assistant | Admitting: Physician Assistant

## 2018-05-27 DIAGNOSIS — L89154 Pressure ulcer of sacral region, stage 4: Secondary | ICD-10-CM | POA: Diagnosis not present

## 2018-05-27 DIAGNOSIS — M4800 Spinal stenosis, site unspecified: Secondary | ICD-10-CM | POA: Diagnosis not present

## 2018-05-27 DIAGNOSIS — E782 Mixed hyperlipidemia: Secondary | ICD-10-CM | POA: Diagnosis not present

## 2018-05-27 DIAGNOSIS — Z7401 Bed confinement status: Secondary | ICD-10-CM | POA: Insufficient documentation

## 2018-05-27 DIAGNOSIS — G894 Chronic pain syndrome: Secondary | ICD-10-CM | POA: Diagnosis not present

## 2018-05-29 NOTE — Progress Notes (Signed)
Jessica [name], Jessica [name] ([id number]) Visit Report for 05/27/2018 Chief Complaint Document Details [name], Jessica [name]. Date of Service: 05/27/2018 11:00 AM Medical Record Number: [id number] Patient Account Number: [id number] Date of Birth/Sex: 22-Aug-1945 (73 y.o. F) Treating RN: Curtis Sites Primary Care Provider: Annita Brod Other Clinician: Referring Provider: Annita Brod Treating Provider/Extender: Linwood Dibbles, HOYT Weeks in Treatment: 31 Information Obtained from: Patient Chief Complaint she is here for evaluation of a sacral ulcer and bilateral lower extremity ulcers Electronic Signature(s) Signed: 05/29/2018 7:15:48 AM By: Lenda Kelp PA-C Entered By: Lenda Kelp on 05/27/2018 11:11:09 Jessica [name] ([id number]) -------------------------------------------------------------------------------- HPI Details [name]. Date of Service: 05/27/2018 11:00 AM Medical Record Number: [id number] Patient Account Number: [id number] Date of Birth/Sex: [birth year] (72 y.o. F) Treating RN: Curtis Sites Primary Care Provider: Annita Brod Other Clinician: Referring Provider: Annita Brod Treating Provider/Extender: Linwood Dibbles, HOYT Weeks in Treatment: 52 History of Present Illness HPI Description: 05/27/17-she is here in initial evaluation for a left-sided sacral stage IV pressure ulcer and bilateral lower extremity, lateral aspect, unstageable pressure ulcers. She is accompanied by her husband and her son, who are her primary caregivers. She is bedbound secondary to spinal stenosis. According to her son and husband she was hospitalized from 10/5-11/1 with healthcare associated pneumonia and altered mental status. During her hospitalization she was intubated, extubated on 10/27. She was discharged with Foley catheter and follow up with urology. According to her son and spouse she developed the sacral ulcer during hospitalization. Home health has  been applying Santyl daily. She does have a low air loss mattress and is repositioned every 2-3 hours per her family's report. According to the son and spouse she had an appointment with urology on 12/18 and during that appointment developed discoloration to her bilateral lower extremities which ultimately developed into unstageable pressure ulcers to the lateral aspects of her bilateral lower extremities. There has been no topical treatment applied to these. She continues to have home health. There is no concerns expressed regarding dietary intake, stating she eats 3 meals a day, eating was provided; she is supplemented with boost with protein. 06/03/17-she is here in follow-up evaluation for sacral and bilateral lower extremity ulcers. Plain film x-ray done today reveals no distraction to the sacrum or coccyx, no visible abnormalities. Home health has ordered the negative pressure wound system but it has not been initiated. We will continue with Hydrofera Blue until initiation of negative pressure wound system and continue with Santyl to bilateral lower extremity ulcers. Follow-up next week 06/10/17-she is here in follow-up evaluation for sacral and bilateral lower extremity ulcers. The wound VAC will be available tomorrow per home health. We will initiate wound VAC therapy to the sacral ulcer 3 times weekly (Thursday, Saturday/Sunday, Tuesday). We will continue with Santyl to the lower extremity ulcers. The patient's son is checking into home health therapy over the weekend for [facility] changes, with the understanding that if VAC changes cannot be performed over the weekend he will need to change his mother's appointments to Monday, Wednesday or Friday. The sacral ulcer clinically does not appear infected but there has been a change in the amount of drainage acutely, there is no significant amount of devitalized tissue, there is no malodor. Wound culture was obtained to evaluate for occult infection we  will hold off on antibiotic therapy until sensitivities are resulted. 06/18/17 on evaluation today patient appears to be doing fairly well in my opinion although this is the first time I  have seen this patient she has been previously evaluated by Tacey RuizLeah here in our office. She is going to be switching to Fridays to see me due to the Wound VAC schedule being changed on Monday, Wednesday, and Friday. Subsequently she seems to be doing fairly well with the Wound VAC. Her son who was present during evaluation today states that he somewhat stresses of the Wound VAC in making sure that it was functioning appropriately. With that being said everything seems to be working well he knows what settings on the [facility] HVAC itself to look at and ensure that it is functioning properly. Overall the wound appears to be nice and clean there is no need for debridement today. She has no discomfort in her bilateral lower extremity ulcers also appear to be improving based on measurements and what this honest tell me about the overall appearance. 07/02/17 on evaluation today I noted in patients wound bed that was actually an odor that had not previously been noted. Subsequently there was also a small area of bone that was not previously noted during my last evaluation. This did appear to be necrotic and was being somewhat forced out by the body around the region of granulation. She does not have any pain which is good news. Nonetheless the overall appearance of the ulcer is making me concerned for the patient having developed osteomyelitis. Currently she is not been on any antibiotics and the Wound VAC has been doing fairly well in general. With that being said I do not think we need to continue the Wound VAC if she potentially has a bone infection. At least not until it is properly addressed with antibiotics. 07/09/17 on evaluation today patient appears to actually be doing very well in regard to her sacral ulcer compared to last  week. There is actually no exposed bone at this point. 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 With that being said still I do believe she needs to have a referral to infectious disease due to the osteomyelitis I am gonna recommend that she continue with the doxycycline based on the culture results which showed Eikenella Corrodens as the Jessica BradyOVERMAN, [name] ([id number]) organism identified that tetracycline should work for. She does not seem to be having any discomfort whatsoever at this point. 07/16/17 on evaluation today patient actually appears to be doing rather well in regard to her sacral wound. I think that the original wound site actually appears much better than previously noted. With that being said she does have a new superficial injury on the right sacral region which appears to be due to according to the sign transport that occurred for her MRI unfortunately. Fortunately this is not too deep and I do think it can be managed but it was a new area that was not previously noted. Otherwise she has been tolerating the Dakinos soaked gauze packing without complication. 07/30/17 on evaluation today patient presents for reevaluation concerning her sacral wound. She has been tolerating the dressing changes without complication. With that being said her wound is doing so well at this point that I think she may be at the point where we could reinitiate the Wound VAC currently and hopefully see good results from this. I'm very pleased with how she has responded to the Dakinos soaked gauze packing. 08/13/17 evaluation today patient appears to be doing excellent in  regard to her lower extremity ulcer this seems to be cleaning up very nicely. In regard to the sacral ulcer the area of trauma on the right lateral portion of the wound actually appears  to be much better than previously noted during the last evaluation. The word has definitely filled in and the Wound VAC appears to be helpful I do believe. Overall I'm very pleased with how things seem to be progressing at this time. Patient likewise is also happy that things are doing well. 08/27/17 on evaluation today patient presents for follow-up concerning her ongoing issues with her sacral ulcer and lower extremity ulcer. Fortunately she has been tolerating the dressing changes well complication the Wound VAC in general seems to have done very well up to this point. She does not have any evidence of infection which is good news. She does have some dark discoloration in central portion of the wound which is troublesome for the possibility of pressure injury to the site it sounds like her prior air mattress was not functioning properly her son has just bought a new one for her this is doing much better. Otherwise things seem to be progressing nicely. 09/10/17 on evaluation today patient presents for follow-up concerning her sacral wound and lower extremity ulcer. She has been tolerating the switch of her dressing changes to the Dakinos soaked gauze dressing very well in regard to the sacrum. The left lateral lower extremity ulcer appears to have a new area open just distal to the one that we have been treating with Santyl and this is new since her last visit. There does not appear to be any evidence of infection which is good news. With that being said there's a lot of maceration here at the site and I feel this is likely due to the switch and the dressings it appears that due to the sticking of the dressings it will switch to utilizing a telfa pad over the Santyl. I think this caused a lot of drainage to be collected and situated in the region just under the dressing which has led to this causing which duration of breakdown. Overall there does not appear to be any significant pain which is good  news. Of note I did actually have a conversation with the radiologist who is an interventional radiologist with [facility]. 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 With that being said there does not appear to be any rush to get this done according to the interventional radiologist whom I spoke with. Therefore after discussion it was determined that there gonna wait about six months prior to considering the procedure to get time for hopefully the sacral wound to heal in the interim to at least some degree. 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 According to nursing staff these are all improved, the negative pressure wound therapy system was put on hold last week. We will resume negative pressure wound therapy today, continue with Santyl to the lower extremity and she will follow-up next week. 09/24/17 on evaluation today unfortunately the patient's wound appears to be doing significantly worse compared to last week's evaluation and even my valuation the week before. She actually has bone noted on the left wound margin in the region of undermining unfortunately. This was not noted during the last evaluation. I am concerned about the fact that this seems to be worse not better since  our last visit with her. My biggest concern is that she's likely developing a worsening osteomyelitis. This was discussed with patient and her son today during the office visit. 05/03/17 on evaluation today patient actually appears to be doing rather well in regard to her sacral ulcer compared to last week's evaluation. She has been tolerating the dressing changes without complication. Fortunately even though we're not utilizing the Wound VAC she seems to be making some strides in seeing the wound area overall improved quite dramatically in my pinion in just one weeks time. She also seems to  be staying off of this to the point that I do not see any evidence of new injury which is excellent news. Whatever she's been doing in that regard over the past week I would want her to continue. I did also review the results of her bone culture which revealed group B strep as the responsible organism. Again this is what's causing her osteomyelitis she does have infectious disease appointment scheduled for 10/11/17 10/08/1898 valuation today patient appears to be doing better for the most part in regard to the sacral wound. Overall she is showing signs of having granulation of the bone which is good news. There is an area towards the left of the wound where she does seem to be showing some signs of opening this it would be due to additional pressure to the area unfortunately. Jessica, [name]. ([id number]) There does not appear to be any evidence of infection spreading which is good news that do not see any evidence of significant purulent discharge which is also good news. 10/26/17 on evaluation today patient sacral ulcer actually appears to be very healthy and doing well in regard to the overall appearance of the wound. With that being said she does seem to be having some issues at this point in time with some shear/friction injury of the sacrum from where she was transported to [facility] for her infectious disease appointment. She has been placed on IV antibiotic therapy I will have to go back and find that note for review as I did not have access to that today. Nonetheless I will add the next visit be sure to document the exact antibiotics that she is taking at this point. Nonetheless I do believe the wound is doing better there's no bone exposure nothing that requires debridement in regard to the sacral wound. Likewise her left lateral lower extremity ulcer also appears to be doing significantly better. In general I feel like things are showing signs of improvement all around. 11/12/17 on  evaluation today patient appears to be doing rather well in regard to her sacral wound. In general I do see signs of granulation at this point. Fortunately there does not appear to be any evidence of infection which is good news as well. In general overall happy with the appearance. With that being said I'll be see I would like for this to be progressing more rapidly as with the patient and her son but nonetheless at least things do not appear to be doing worse. Her lower extremity ulcer is doing significantly better. 11/26/17 on evaluation today patient appears to be doing a little better in regard to the sacral wound especially in the peripheral of the wound bed. She actually did have an abrasion on the right gluteal region that's completely resolved to this point. Her lower extremity also appears to be for the most part completely resolved. Overall the sacrum itself seems to be the one thing that is  still open and given her trouble. No fevers, chills, nausea, or vomiting noted at this time. She actually has an appointment next week with infectious disease for recheck to see how things are doing. She maybe switch to oral medications at that point. 12/09/17 on evaluation today patient actually appears to be doing much better in regard to her sacral wound. I do feel like she has made good progress at this time as far as healing is concerned. In fact the wound looks better to me today than it has in quite some time. She is on oral antibiotic therapy and no longer on the IV antibiotics. She did see infectious disease last week. 12/24/17 on evaluation today patient's wounds actually appear to be doing decently well in regard to the sacral wound in particular. When it comes to her lower extremity ulcers both appear to be completely healed at this point in the one area where she had lived the rash has completely resolved at this time. Overall very pleased with the progress that has been made. Nonetheless  the patient seems to be doing well in general. She still does have quite a bit of healing to do in regard to the sacral wound but I feel like she is making progress. 01/07/18 on evaluation today patient actually appears to be doing excellent today regard to her sacral wound. The granular quality in the base of the wound is excellent and shown signs of good improvement there's no evidence of contusion nor deep tissue injury and the periwound actually appears to be doing well. In general I'm very pleased with the overall appearance. 02/04/18 on evaluation today patient's wound actually appears to be doing decently well as far as the granulation is concerned. She has been tolerating the dressing changes without complication. Right now we are still using the Wound VAC. Nonetheless overall there apparently is some cater to the wound and this is concerning simply for the fact that she could be developing a soft tissue infection again. She's had this happen in the past and at that time responded very well to the antibiotics. Nonetheless I don't think I would likely put her on anything prophylactically but rather wait for the results of the culture to return. 02/18/18 on evaluation today patient actually appears to be doing fairly well. She has been tolerating the dressing changes. And the Wound VAC seems to be doing well. Overall I'm very pleased with how things have improved over the past couple weeks since I last saw her. Fortunately there does not appear to be any evidence of overt infection at this time which is good news. She has been taking the antibiotics without complication. Specifically that is the Bactrim DS which was prescribed on 02/08/18. 03/18/18 on evaluation today patient's wound actually does have quite a bit of odor noted compared to previous. With that being said the wound itself overall does not appear to be too significant or worse. There is no event noted in the wound bed which is good  news. With that being said the patient has been worried as the home health nurse stated that she thought she saw bone in the base of the wound. Nonetheless there's a little bit of bruising along the left lateral border of the wound bed which again does not appear to be terrible we have noticed this before but other than this the majority the wound bed appears to be doing excellent in my pinion. I am wondering if there may be some bacterial colonization. This  could be leading to some of the odor that we are noting with the Wound VAC. Jessica BradyOVERMAN, [name] ([id number]) 04/01/18 on evaluation today patient appears to actually be doing well in regard to the overall appearance of the wound itself. She also does not have any odor at this point in regard to the wound surface and I do believe the Bacons is extremely helpful in this regard. With that being said she does have a little bit of deep tissue injury on the medial aspect of the wound. Again last time it was more lateral. The lateral seems to have improved quite well. Nonetheless she also notes by way of her son that this has been very macerated in the past several weeks since I last saw her. Though things overall seem to be doing better I still think that the Wound VAC may help with managing her fluid better I'm wondering if we can attempt a gauze Wound VAC my hope is that this will be able to manage the moisture control as well as continuing with managing the odor of the wound and bacterial load since the Dakinos has done excellent in helping in that regard. 04/15/17 on evaluation today patient actually appears to be doing much better in regard to the overall appearance of her sacral wound. I do believe that the gauze Vac has been of benefit for her. Overall she is tolerating this without complication which is also good news. There is no sign of injury to the sacral region at this point. 04/29/18 on evaluation today patient appears to be doing well in  regard to her sacral ulcer. She sure signs of good granulation and the Gauze VAC is doing very well for her. Overall I'm pleased in this regard. The biggest issue is that the time from Friday till Monday seems to be quite long for her as far as the amount of time to keep the Wound VAC sealed. For that reason her son wonders if he can take this off on Sunday morning in order to give her day of just wet to dry dressing's with the Dakin's solution I think this would be okay. 05/13/18 on evaluation today patient appears to be doing okay in regard to her sacral ulcer. There does not appear to be any evidence of overt infection at this time which is good news. In general the drainage and moisture appears to be significantly improved this was after the pressure on the Wound VAC was increased after they contacted our office. With that being said a lot of the dark tissue around the edge of the wound has improved in the maceration is significantly better this seems to be managing much better. Fortunately there is no signs of infection at this time. Overall the patient is doing quite well. The wound measurements are not significantly smaller today although they were over the past two visits we will see how things go. 05/27/18 on evaluation today patient's sacral wound actually appears to be doing fairly well at this point. She does have a deep tissue injury yet again on the medial aspect of the wound which is something that we seem to be seeing often. There is some question as to whether or not this is actually being caused by the Wound VAC bunching up and then her somewhat laying on it even though they are attempting to offload this as best as possible. Patient son states that overall they do the best they can trying offload her and I definitely believe him. I  may want to see about putting the Wound VAC on hold for a couple weeks and see if this happens just with standard dressing changes. Electronic  Signature(s) Signed: 05/29/2018 7:15:48 AM By: Lenda Kelp PA-C Entered By: Lenda Kelp on 05/27/2018 14:05:13 Jessica [name] ([id number]) -------------------------------------------------------------------------------- Physical Exam Details [name]. Date of Service: 05/27/2018 11:00 AM Medical Record Number: [id number] Patient Account Number: [id number] Date of Birth/Sex: 04-21-1945 (73 y.o. F) Treating RN: Curtis Sites Primary Care Provider: Annita Brod Other Clinician: Referring Provider: Annita Brod Treating Provider/Extender: STONE III, HOYT Weeks in Treatment: 52 Constitutional Obese and well-hydrated in no acute distress. Respiratory normal breathing without difficulty. clear to auscultation bilaterally. Cardiovascular regular rate and rhythm with normal S1, S2. Psychiatric this patient is able to make decisions and demonstrates good insight into disease process. Alert and Oriented x 3. pleasant and cooperative. Notes On inspection today patient's wound bed did have some dark discoloration to the deep tissue injury along the medial aspect of the wound bed. Again I believe this may be due to the fact that she is getting pressure to the wound bed region from the gauze and tissue somewhat bunching up and even though there attempting to appropriately offload it's making it very difficult for this to really do that. She seems to do better when they just use the Dakinos soaked gauze dressing. Electronic Signature(s) Signed: 05/29/2018 7:15:48 AM By: Lenda Kelp PA-C Entered By: Lenda Kelp on 05/27/2018 15:43:18 Jessica [name] ([id number]) -------------------------------------------------------------------------------- Physician Orders Details [name]. Date of Service: 05/27/2018 11:00 AM Medical Record Number: [id number] Patient Account Number: [id number] Date of Birth/Sex: February 25, 1946 (73 y.o.  F) Treating RN: Curtis Sites Primary Care Provider: Annita Brod Other Clinician: Referring Provider: Annita Brod Treating Provider/Extender: Linwood Dibbles, HOYT Weeks in Treatment: 37 Verbal / Phone Orders: No Diagnosis Coding ICD-10 Coding Code Description L89.154 Pressure ulcer of sacral region, stage 4 M48.00 Spinal stenosis, site unspecified R54 Age-related physical debility G89.4 Chronic pain syndrome E78.2 Mixed hyperlipidemia L89.93 Pressure ulcer of unspecified site, stage 3 Wound Cleansing Wound #1 Medial Sacrum o Other: - please cleanse sacral wound with dakins moistened gauze - do not spray dakins on wound Anesthetic (add to Medication List) Wound #1 Medial Sacrum o Topical Lidocaine 4% cream applied to wound bed prior to debridement (In Clinic Only). Skin Barriers/Peri-Wound Care Wound #1 Medial Sacrum o Skin Prep Primary Wound Dressing Wound #1 Medial Sacrum o Collagen - collagen to wound bed every other day o Pack wound with: - Dakin's soaked gauze Secondary Dressing Wound #1 Medial Sacrum o ABD pad - secure with tape Dressing Change Frequency Wound #1 Medial Sacrum o Change dressing twice daily. Follow-up Appointments Wound #1 Medial Sacrum o Return Appointment in 2 weeks. Off-Loading Wound #1 Medial Sacrum SHAKEITHIA, UMBEL. ([id number]) o Turn and reposition every 2 hours Additional Orders / Instructions Wound #1 Medial Sacrum o Increase protein intake. Home Health Wound #1 Medial Sacrum o Continue Home Health Visits - Advanced o Home Health Nurse may visit PRN to address patientos wound care needs. o FACE TO FACE ENCOUNTER: MEDICARE and MEDICAID PATIENTS: I certify that this patient is under my care and that I had a face-to-face encounter that meets the physician face-to-face encounter requirements with this patient on this date. The encounter with the patient was in whole or in part for the following  MEDICAL CONDITION: (primary reason for Home Healthcare) MEDICAL NECESSITY: I certify, that based on  my findings, NURSING services are a medically necessary home health service. HOME BOUND STATUS: I certify that my clinical findings support that this patient is homebound (i.e., Due to illness or injury, pt requires aid of supportive devices such as crutches, cane, wheelchairs, walkers, the use of special transportation or the assistance of another person to leave their place of residence. There is a normal inability to leave the home and doing so requires considerable and taxing effort. Other absences are for medical reasons / religious services and are infrequent or of short duration when for other reasons). o If current dressing causes regression in wound condition, may D/C ordered dressing product/s and apply Normal Saline Moist Dressing daily until next Wound Healing Center / Other MD appointment. Notify Wound Healing Center of regression in wound condition at [phone]. o Please direct any NON-WOUND related issues/requests for orders to patient's Primary Care Physician Negative Pressure Wound Therapy Wound #1 Medial Sacrum o Place NPWT on HOLD. - NPWT on hold for 2 weeks Electronic Signature(s) Signed: 05/27/2018 4:52:28 PM By: Curtis Sitesorthy, Joanna Signed: 05/29/2018 7:15:48 AM By: Lenda KelpStone III, Hoyt PA-C Entered By: Curtis Sitesorthy, Joanna on 05/27/2018 12:05:01 Jessica BradyVERMAN, [name] ([id number]) -------------------------------------------------------------------------------- Problem List Details Patient Name: Jessica BradyVERMAN, [name] Date of Service: 05/27/2018 11:00 AM Medical Record Number: [id number] Patient Account Number: [id number] Date of Birth/Sex: 26-Jul-1945 (73 y.o. F) Treating RN: Curtis Sitesorthy, Joanna Primary Care Provider: Annita BrodASENSO, PHILIP Other Clinician: Referring Provider: Annita BrodASENSO, PHILIP Treating Provider/Extender: Linwood DibblesSTONE III, HOYT Weeks in Treatment: 52 Active Problems ICD-10 Evaluated  Encounter Code Description Active Date Today Diagnosis L89.154 Pressure ulcer of sacral region, stage 4 05/27/2017 No Yes M48.00 Spinal stenosis, site unspecified 05/27/2017 No Yes R54 Age-related physical debility 05/27/2017 No Yes G89.4 Chronic pain syndrome 05/27/2017 No Yes E78.2 Mixed hyperlipidemia 05/27/2017 No Yes L89.93 Pressure ulcer of unspecified site, stage 3 05/27/2017 No Yes Inactive Problems Resolved Problems Electronic Signature(s) Signed: 05/29/2018 7:15:48 AM By: Lenda KelpStone III, Hoyt PA-C Entered By: Lenda KelpStone III, Hoyt on 05/27/2018 11:11:04 Jessica BradyVERMAN, [name] ([id number]) -------------------------------------------------------------------------------- Progress Note Details Patient Name: Jessica BradyVERMAN, [name] Date of Service: 05/27/2018 11:00 AM Medical Record Number: [id number] Patient Account Number: [id number] Date of Birth/Sex: 26-Jul-1945 (73 y.o. F) Treating RN: Curtis Sitesorthy, Joanna Primary Care Provider: Annita BrodASENSO, PHILIP Other Clinician: Referring Provider: Annita BrodASENSO, PHILIP Treating Provider/Extender: Linwood DibblesSTONE III, HOYT Weeks in Treatment: 52 Subjective Chief Complaint Information obtained from Patient she is here for evaluation of a sacral ulcer and bilateral lower extremity ulcers History of Present Illness (HPI) 05/27/17-she is here in initial evaluation for a left-sided sacral stage IV pressure ulcer and bilateral lower extremity, lateral aspect, unstageable pressure ulcers. She is accompanied by her husband and her son, who are her primary caregivers. She is bedbound secondary to spinal stenosis. According to her son and husband she was hospitalized from 10/5-11/1 with healthcare associated pneumonia and altered mental status. During her hospitalization she was intubated, extubated on 10/27. She was discharged with Foley catheter and follow up with urology. According to her son and spouse she developed the sacral ulcer during hospitalization. Home health has been applying Santyl  daily. She does have a low air loss mattress and is repositioned every 2-3 hours per her family's report. According to the son and spouse she had an appointment with urology on 12/18 and during that appointment developed discoloration to her bilateral lower extremities which ultimately developed into unstageable pressure ulcers to the lateral aspects of her bilateral lower extremities. There has been no topical treatment applied to these. She continues to  have home health. There is no concerns expressed regarding dietary intake, stating she eats 3 meals a day, eating was provided; she is supplemented with boost with protein. 06/03/17-she is here in follow-up evaluation for sacral and bilateral lower extremity ulcers. Plain film x-ray done today reveals no distraction to the sacrum or coccyx, no visible abnormalities. Home health has ordered the negative pressure wound system but it has not been initiated. We will continue with Hydrofera Blue until initiation of negative pressure wound system and continue with Santyl to bilateral lower extremity ulcers. Follow-up next week 06/10/17-she is here in follow-up evaluation for sacral and bilateral lower extremity ulcers. The wound VAC will be available tomorrow per home health. We will initiate wound VAC therapy to the sacral ulcer 3 times weekly (Thursday, Saturday/Sunday, Tuesday). We will continue with Santyl to the lower extremity ulcers. The patient's son is checking into home health therapy over the weekend for [facility] changes, with the understanding that if VAC changes cannot be performed over the weekend he will need to change his mother's appointments to Monday, Wednesday or Friday. The sacral ulcer clinically does not appear infected but there has been a change in the amount of drainage acutely, there is no significant amount of devitalized tissue, there is no malodor. Wound culture was obtained to evaluate for occult infection we will hold off on  antibiotic therapy until sensitivities are resulted. 06/18/17 on evaluation today patient appears to be doing fairly well in my opinion although this is the first time I have seen this patient she has been previously evaluated by Tacey Ruiz here in our office. She is going to be switching to Fridays to see me due to the Wound VAC schedule being changed on Monday, Wednesday, and Friday. Subsequently she seems to be doing fairly well with the Wound VAC. Her son who was present during evaluation today states that he somewhat stresses of the Wound VAC in making sure that it was functioning appropriately. With that being said everything seems to be working well he knows what settings on the [facility] itself to look at and ensure that it is functioning properly. Overall the wound appears to be nice and clean there is no need for debridement today. She has no discomfort in her bilateral lower extremity ulcers also appear to be improving based on measurements and what this honest tell me about the overall appearance. 07/02/17 on evaluation today I noted in patients wound bed that was actually an odor that had not previously been noted. Subsequently there was also a small area of bone that was not previously noted during my last evaluation. This did appear to be necrotic and was being somewhat forced out by the body around the region of granulation. She does not have any pain which is good news. Nonetheless the overall appearance of the ulcer is making me concerned for the patient having developed osteomyelitis. Currently she is not been on any antibiotics and the Wound VAC has been doing fairly well in general. With that being said I do not think we need to continue the Wound VAC if she potentially has a bone infection. At least not until it is properly addressed with antibiotics. Jessica, [name] ([id number]) 07/09/17 on evaluation today patient appears to actually be doing very well in regard to her sacral ulcer  compared to last week. There is actually no exposed bone at this point. Her pathology report showed early signs of osteomyelitis which had explained to the patient son is definitely good  news catching this early is often a key to getting it better without things worsening. With that being said still I do believe she needs to have a referral to infectious disease due to the osteomyelitis I am gonna recommend that she continue with the doxycycline based on the culture results which showed Eikenella Corrodens as the organism identified that tetracycline should work for. She does not seem to be having any discomfort whatsoever at this point. 07/16/17 on evaluation today patient actually appears to be doing rather well in regard to her sacral wound. I think that the original wound site actually appears much better than previously noted. With that being said she does have a new superficial injury on the right sacral region which appears to be due to according to the sign transport that occurred for her MRI unfortunately. Fortunately this is not too deep and I do think it can be managed but it was a new area that was not previously noted. Otherwise she has been tolerating the Dakin s soaked gauze packing without complication. 07/30/17 on evaluation today patient presents for reevaluation concerning her sacral wound. She has been tolerating the dressing changes without complication. With that being said her wound is doing so well at this point that I think she may be at the point where we could reinitiate the Wound VAC currently and hopefully see good results from this. I'm very pleased with how she has responded to the [facility] s soaked gauze packing. 08/13/17 evaluation today patient appears to be doing excellent in regard to her lower extremity ulcer this seems to be cleaning up very nicely. In regard to the sacral ulcer the area of trauma on the right lateral portion of the wound actually appears to be much  better than previously noted during the last evaluation. The word has definitely filled in and the Wound VAC appears to be helpful I do believe. Overall I'm very pleased with how things seem to be progressing at this time. Patient likewise is also happy that things are doing well. 08/27/17 on evaluation today patient presents for follow-up concerning her ongoing issues with her sacral ulcer and lower extremity ulcer. Fortunately she has been tolerating the dressing changes well complication the Wound VAC in general seems to have done very well up to this point. She does not have any evidence of infection which is good news. She does have some dark discoloration in central portion of the wound which is troublesome for the possibility of pressure injury to the site it sounds like her prior air mattress was not functioning properly her son has just bought a new one for her this is doing much better. Otherwise things seem to be progressing nicely. 09/10/17 on evaluation today patient presents for follow-up concerning her sacral wound and lower extremity ulcer. She has been tolerating the switch of her dressing changes to the Dakin s soaked gauze dressing very well in regard to the sacrum. The left lateral lower extremity ulcer appears to have a new area open just distal to the one that we have been treating with Santyl and this is new since her last visit. There does not appear to be any evidence of infection which is good news. With that being said there's a lot of maceration here at the site and I feel this is likely due to the switch and the dressings it appears that due to the sticking of the dressings it will switch to utilizing a telfa pad over the Santyl. I think  this caused a lot of drainage to be collected and situated in the region just under the dressing which has led to this causing which duration of breakdown. Overall there does not appear to be any significant pain which is good news. Of note  I did actually have a conversation with the radiologist who is an interventional radiologist with [facility]. 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 With that being said there does not appear to be any rush to get this done according to the interventional radiologist whom I spoke with. Therefore after discussion it was determined that there gonna wait about six months prior to considering the procedure to get time for hopefully the sacral wound to heal in the interim to at least some degree. 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 According to nursing staff these are all improved, the negative pressure wound therapy system was put on hold last week. We will resume negative pressure wound therapy today, continue with Santyl to the lower extremity and she will follow-up next week. 09/24/17 on evaluation today unfortunately the patient's wound appears to be doing significantly worse compared to last week's evaluation and even my valuation the week before. She actually has bone noted on the left wound margin in the region of undermining unfortunately. This was not noted during the last evaluation. I am concerned about the fact that this seems to be worse not better since our last visit with her. My biggest concern is that she's likely developing a worsening osteomyelitis. This was discussed with patient and her son today during the office visit. 05/03/17 on evaluation today patient actually appears to be doing rather well in regard to her sacral ulcer compared to last week's evaluation. She has been tolerating the dressing changes without complication. Fortunately even though we're not utilizing the Wound VAC she seems to be making some strides in seeing the wound area overall improved quite dramatically in my pinion in just one weeks time. She also seems to be staying  off of this to the point that I do not see any evidence of new injury which is excellent news. Whatever she's been doing in that regard over the past week I would want her to continue. Jessica, [name] ([id number]) did also review the results of her bone culture which revealed group B strep as the responsible organism. Again this is what's causing her osteomyelitis she does have infectious disease appointment scheduled for 10/11/17 10/08/1898 valuation today patient appears to be doing better for the most part in regard to the sacral wound. Overall she is showing signs of having granulation of the bone which is good news. There is an area towards the left of the wound where she does seem to be showing some signs of opening this it would be due to additional pressure to the area unfortunately. There does not appear to be any evidence of infection spreading which is good news that do not see any evidence of significant purulent discharge which is also good news. 10/26/17 on evaluation today patient sacral ulcer actually appears to be very healthy and doing well in regard to the overall appearance of the wound. With that being said she does seem to be having some issues at this point in time with some shear/friction injury of the sacrum from where she was transported to [facility] for her infectious disease appointment. She  has been placed on IV antibiotic therapy I will have to go back and find that note for review as I did not have access to that today. Nonetheless I will add the next visit be sure to document the exact antibiotics that she is taking at this point. Nonetheless I do believe the wound is doing better there's no bone exposure nothing that requires debridement in regard to the sacral wound. Likewise her left lateral lower extremity ulcer also appears to be doing significantly better. In general I feel like things are showing signs of improvement all around. 11/12/17 on evaluation today  patient appears to be doing rather well in regard to her sacral wound. In general I do see signs of granulation at this point. Fortunately there does not appear to be any evidence of infection which is good news as well. In general overall happy with the appearance. With that being said I'll be see I would like for this to be progressing more rapidly as with the patient and her son but nonetheless at least things do not appear to be doing worse. Her lower extremity ulcer is doing significantly better. 11/26/17 on evaluation today patient appears to be doing a little better in regard to the sacral wound especially in the peripheral of the wound bed. She actually did have an abrasion on the right gluteal region that's completely resolved to this point. Her lower extremity also appears to be for the most part completely resolved. Overall the sacrum itself seems to be the one thing that is still open and given her trouble. No fevers, chills, nausea, or vomiting noted at this time. She actually has an appointment next week with infectious disease for recheck to see how things are doing. She maybe switch to oral medications at that point. 12/09/17 on evaluation today patient actually appears to be doing much better in regard to her sacral wound. I do feel like she has made good progress at this time as far as healing is concerned. In fact the wound looks better to me today than it has in quite some time. She is on oral antibiotic therapy and no longer on the IV antibiotics. She did see infectious disease last week. 12/24/17 on evaluation today patient's wounds actually appear to be doing decently well in regard to the sacral wound in particular. When it comes to her lower extremity ulcers both appear to be completely healed at this point in the one area where she had lived the rash has completely resolved at this time. Overall very pleased with the progress that has been made. Nonetheless the patient seems  to be doing well in general. She still does have quite a bit of healing to do in regard to the sacral wound but I feel like she is making progress. 01/07/18 on evaluation today patient actually appears to be doing excellent today regard to her sacral wound. The granular quality in the base of the wound is excellent and shown signs of good improvement there's no evidence of contusion nor deep tissue injury and the periwound actually appears to be doing well. In general I'm very pleased with the overall appearance. 02/04/18 on evaluation today patient's wound actually appears to be doing decently well as far as the granulation is concerned. She has been tolerating the dressing changes without complication. Right now we are still using the Wound VAC. Nonetheless overall there apparently is some cater to the wound and this is concerning simply for the fact that she could be  developing a soft tissue infection again. She's had this happen in the past and at that time responded very well to the antibiotics. Nonetheless I don't think I would likely put her on anything prophylactically but rather wait for the results of the culture to return. 02/18/18 on evaluation today patient actually appears to be doing fairly well. She has been tolerating the dressing changes. And the Wound VAC seems to be doing well. Overall I'm very pleased with how things have improved over the past couple weeks since I last saw her. Fortunately there does not appear to be any evidence of overt infection at this time which is good news. She has been taking the antibiotics without complication. Specifically that is the Bactrim DS which was prescribed on 02/08/18. 03/18/18 on evaluation today patient's wound actually does have quite a bit of odor noted compared to previous. With that MOE, ROHLFS. ([id number]) being said the wound itself overall does not appear to be too significant or worse. There is no event noted in the wound  bed which is good news. With that being said the patient has been worried as the home health nurse stated that she thought she saw bone in the base of the wound. Nonetheless there's a little bit of bruising along the left lateral border of the wound bed which again does not appear to be terrible we have noticed this before but other than this the majority the wound bed appears to be doing excellent in my pinion. I am wondering if there may be some bacterial colonization. This could be leading to some of the odor that we are noting with the Wound VAC. 04/01/18 on evaluation today patient appears to actually be doing well in regard to the overall appearance of the wound itself. She also does not have any odor at this point in regard to the wound surface and I do believe the Bacons is extremely helpful in this regard. With that being said she does have a little bit of deep tissue injury on the medial aspect of the wound. Again last time it was more lateral. The lateral seems to have improved quite well. Nonetheless she also notes by way of her son that this has been very macerated in the past several weeks since I last saw her. Though things overall seem to be doing better I still think that the Wound VAC may help with managing her fluid better I'm wondering if we can attempt a gauze Wound VAC my hope is that this will be able to manage the moisture control as well as continuing with managing the odor of the wound and bacterial load since the Dakin s has done excellent in helping in that regard. 04/15/17 on evaluation today patient actually appears to be doing much better in regard to the overall appearance of her sacral wound. I do believe that the gauze Vac has been of benefit for her. Overall she is tolerating this without complication which is also good news. There is no sign of injury to the sacral region at this point. 04/29/18 on evaluation today patient appears to be doing well in regard to her  sacral ulcer. She sure signs of good granulation and the Gauze VAC is doing very well for her. Overall I'm pleased in this regard. The biggest issue is that the time from Friday till Monday seems to be quite long for her as far as the amount of time to keep the Wound VAC sealed. For that reason her son  wonders if he can take this off on Sunday morning in order to give her day of just wet to dry dressing's with the Dakin's solution I think this would be okay. 05/13/18 on evaluation today patient appears to be doing okay in regard to her sacral ulcer. There does not appear to be any evidence of overt infection at this time which is good news. In general the drainage and moisture appears to be significantly improved this was after the pressure on the Wound VAC was increased after they contacted our office. With that being said a lot of the dark tissue around the edge of the wound has improved in the maceration is significantly better this seems to be managing much better. Fortunately there is no signs of infection at this time. Overall the patient is doing quite well. The wound measurements are not significantly smaller today although they were over the past two visits we will see how things go. 05/27/18 on evaluation today patient's sacral wound actually appears to be doing fairly well at this point. She does have a deep tissue injury yet again on the medial aspect of the wound which is something that we seem to be seeing often. There is some question as to whether or not this is actually being caused by the Wound VAC bunching up and then her somewhat laying on it even though they are attempting to offload this as best as possible. Patient son states that overall they do the best they can trying offload her and I definitely believe him. I may want to see about putting the Wound VAC on hold for a couple weeks and see if this happens just with standard dressing changes. Patient History Information  obtained from Patient. Family History No family history of Cancer, Diabetes, Heart Disease. Social History Never smoker, Marital Status - Married. Review of Systems (ROS) Constitutional Symptoms (General Health) Denies complaints or symptoms of Fever, Chills. Respiratory The patient has no complaints or symptoms. Cardiovascular The patient has no complaints or symptoms. Psychiatric The patient has no complaints or symptoms. Jessica, [name] ([id number]) Objective Constitutional Obese and well-hydrated in no acute distress. Vitals Time Taken: 11:00 AM, Height: 70 in, Temperature: 98.1 F, Pulse: 73 bpm, Respiratory Rate: 16 breaths/min, Blood Pressure: 131/59 mmHg. Respiratory normal breathing without difficulty. clear to auscultation bilaterally. Cardiovascular regular rate and rhythm with normal S1, S2. Psychiatric this patient is able to make decisions and demonstrates good insight into disease process. Alert and Oriented x 3. pleasant and cooperative. General Notes: On inspection today patient's wound bed did have some dark discoloration to the deep tissue injury along the medial aspect of the wound bed. Again I believe this may be due to the fact that she is getting pressure to the wound bed region from the gauze and tissue somewhat bunching up and even though there attempting to appropriately offload it's making it very difficult for this to really do that. She seems to do better when they just use the Dakin s soaked gauze dressing. Integumentary (Hair, Skin) Wound #1 status is Open. Original cause of wound was Pressure Injury. The wound is located on the Medial Sacrum. The wound measures 7.1cm length x 5.2cm width x 2.2cm depth; 28.997cm^2 area and 63.793cm^3 volume. There is muscle, Fat Layer (Subcutaneous Tissue) Exposed, and fascia exposed. There is no tunneling noted, however, there is undermining starting at 12:00 and ending at 8:00 with a maximum distance of 2cm.  There is a medium amount of sanguinous drainage noted.  Foul odor after cleansing was noted. The wound margin is distinct with the outline attached to the wound base. There is large (67-100%) red, pink, hyper - granulation within the wound bed. There is a small (1-33%) amount of necrotic tissue within the wound bed including Adherent Slough. The periwound skin appearance exhibited: Induration, Scarring, Maceration, Rubor, Erythema. The periwound skin appearance did not exhibit: Callus, Crepitus, Excoriation, Rash, Dry/Scaly, Atrophie Blanche, Cyanosis, Ecchymosis, Hemosiderin Staining, Mottled, Pallor. The surrounding wound skin color is noted with erythema which is circumferential. Periwound temperature was noted as No Abnormality. The periwound has tenderness on palpation. Assessment Active Problems ICD-10 Pressure ulcer of sacral region, stage 4 Spinal stenosis, site unspecified Age-related physical debility Chronic pain syndrome MECCA, GUITRON. ([id number]) Mixed hyperlipidemia Pressure ulcer of unspecified site, stage 3 Plan Wound Cleansing: Wound #1 Medial Sacrum: Other: - please cleanse sacral wound with dakins moistened gauze - do not spray dakins on wound Anesthetic (add to Medication List): Wound #1 Medial Sacrum: Topical Lidocaine 4% cream applied to wound bed prior to debridement (In Clinic Only). Skin Barriers/Peri-Wound Care: Wound #1 Medial Sacrum: Skin Prep Primary Wound Dressing: Wound #1 Medial Sacrum: Collagen - collagen to wound bed every other day Pack wound with: - Dakin's soaked gauze Secondary Dressing: Wound #1 Medial Sacrum: ABD pad - secure with tape Dressing Change Frequency: Wound #1 Medial Sacrum: Change dressing twice daily. Follow-up Appointments: Wound #1 Medial Sacrum: Return Appointment in 2 weeks. Off-Loading: Wound #1 Medial Sacrum: Turn and reposition every 2 hours Additional Orders / Instructions: Wound #1 Medial Sacrum: Increase  protein intake. Home Health: Wound #1 Medial Sacrum: Continue Home Health Visits - [facility] Nurse may visit [facility] to address patient s wound care needs. FACE TO FACE ENCOUNTER: MEDICARE and MEDICAID PATIENTS: I certify that this patient is under my care and that I had a face-to-face encounter that meets the physician face-to-face encounter requirements with this patient on this date. The encounter with the patient was in whole or in part for the following MEDICAL CONDITION: (primary reason for Home Healthcare) MEDICAL NECESSITY: I certify, that based on my findings, NURSING services are a medically necessary home health service. HOME BOUND STATUS: I certify that my clinical findings support that this patient is homebound (i.e., Due to illness or injury, pt requires aid of supportive devices such as crutches, cane, wheelchairs, walkers, the use of special transportation or the assistance of another person to leave their place of residence. There is a normal inability to leave the home and doing so requires considerable and taxing effort. Other absences are for medical reasons / religious services and are infrequent or of short duration when for other reasons). If current dressing causes regression in wound condition, may D/C ordered dressing product/s and apply Normal Saline Moist Dressing daily until next Wound Healing Center / Other MD appointment. Notify Wound Healing Center of regression in wound condition at [phone]. Please direct any NON-WOUND related issues/requests for orders to patient's Primary Care Physician Negative Pressure Wound Therapy: Wound #1 Medial Sacrum: Place NPWT on HOLD. - NPWT on hold for 2 weeks Jessica, JIRON. ([id number]) My suggestion at this point is gonna be that we go ahead and initiate treatment for the next two weeks just utilizing the Dakin s soaked gauze dressing. We will avoid the Wound VAC putting that on hold for the two weeks. Depending  on how things are doing at that point we will subsequently either reinitiate the Wound VAC or just discontinue  that altogether at that point. She's in agreement that plan. If anything changes or worsens in the meantime she will contact the office and let me know or have her son do so for her on her behalf. Otherwise will see were things stand at follow-up. Please see above for specific wound care orders. We will see patient for re-evaluation in 2 week(s) here in the clinic. If anything worsens or changes patient will contact our office for additional recommendations. Electronic Signature(s) Signed: 05/29/2018 7:15:48 AM By: Lenda KelpStone III, Hoyt PA-C Entered By: Lenda KelpStone III, Hoyt on 05/27/2018 15:44:23 Jessica BradyVERMAN, Oriel W. ([id number]) -------------------------------------------------------------------------------- ROS/PFSH Details Patient Name: Jessica BradyVERMAN, Yael W. Date of Service: 05/27/2018 11:00 AM Medical Record Number: [id number] Patient Account Number: [id number] Date of Birth/Sex: 1945-10-01 (73 y.o. F) Treating RN: Curtis Sitesorthy, Joanna Primary Care Provider: Annita BrodASENSO, PHILIP Other Clinician: Referring Provider: Annita BrodASENSO, PHILIP Treating Provider/Extender: STONE III, HOYT Weeks in Treatment: 52 Information Obtained From Patient Wound History Constitutional Symptoms (General Health) Complaints and Symptoms: Negative for: Fever; Chills Respiratory Complaints and Symptoms: No Complaints or Symptoms Cardiovascular Complaints and Symptoms: No Complaints or Symptoms Psychiatric Complaints and Symptoms: No Complaints or Symptoms Immunizations Pneumococcal Vaccine: Received Pneumococcal Vaccination: No Implantable Devices Family and Social History Cancer: No; Diabetes: No; Heart Disease: No; Never smoker; Marital Status - Married Physician Affirmation I have reviewed and agree with the above information. Electronic Signature(s) Signed: 05/27/2018 4:52:28 PM By: Curtis Sitesorthy, Joanna Signed: 05/29/2018  7:15:48 AM By: Lenda KelpStone III, Hoyt PA-C Entered By: Lenda KelpStone III, Hoyt on 05/27/2018 14:05:26 Jessica BradyVERMAN, Denelle W. ([id number]) -------------------------------------------------------------------------------- SuperBill Details Patient Name: Jessica BradyVERMAN, Brissia W. Date of Service: 05/27/2018 Medical Record Number: [id number] Patient Account Number: [id number] Date of Birth/Sex: 1945-10-01 (73 y.o. F) Treating RN: Curtis Sitesorthy, Joanna Primary Care Provider: Annita BrodASENSO, PHILIP Other Clinician: Referring Provider: Annita BrodASENSO, PHILIP Treating Provider/Extender: STONE III, HOYT Weeks in Treatment: 52 Diagnosis Coding ICD-10 Codes Code Description L89.154 Pressure ulcer of sacral region, stage 4 M48.00 Spinal stenosis, site unspecified R54 Age-related physical debility G89.4 Chronic pain syndrome E78.2 Mixed hyperlipidemia L89.93 Pressure ulcer of unspecified site, stage 3 Facility Procedures CPT4 Code: [id number] Description: 99213 - WOUND CARE VISIT-LEV 3 EST PT Modifier: Quantity: 1 Physician Procedures CPT4 Code: [id number] Description: 99214 - WC PHYS LEVEL 4 - EST PT ICD-10 Diagnosis Description L89.154 Pressure ulcer of sacral region, stage 4 M48.00 Spinal stenosis, site unspecified G89.4 Chronic pain syndrome R54 Age-related physical debility Modifier: Quantity: 1 Electronic Signature(s) Signed: 05/29/2018 7:15:48 AM By: Lenda KelpStone III, Hoyt PA-C Entered By: Lenda KelpStone III, Hoyt on 05/27/2018 15:44:38

## 2018-05-30 NOTE — Progress Notes (Signed)
PLUMER, ERICKSEN ([id number]) Visit Report for 05/27/2018 Arrival Information Details [name], Jessica [name]. Date of Service: 05/27/2018 11:00 AM Medical Record Number: [id number] Patient Account Number: [id number] Date of Birth/Sex: 1946-03-12 (73 y.o. F) Treating RN: Curtis Sites Primary Care Shresta Risden: Annita Brod Other Clinician: Referring Amelda Hapke: Annita Brod Treating Moncerrath Berhe/Extender: Linwood Dibbles, HOYT Weeks in Treatment: 52 Visit Information History Since Last Visit Added or deleted any medications: No Patient Arrived: Stretcher Any new allergies or adverse reactions: No Arrival Time: 10:57 Had a fall or experienced change in No Accompanied By: EMS and son activities of daily living that may affect Transfer Assistance: Manual risk of falls: Patient Has Alerts: Yes Signs or symptoms of abuse/neglect since last visito No Patient Alerts: ALLERGIC TO ZINC Hospitalized since last visit: No Implantable device outside of the clinic excluding No cellular tissue based products placed in the center since last visit: Has Dressing in Place as Prescribed: Yes Pain Present Now: No Electronic Signature(s) Signed: 05/27/2018 4:08:56 PM By: Dayton Martes RCP, RRT, CHT Entered By: Dayton Martes on 05/27/2018 11:02:28 Jessica [name] ([id number]) -------------------------------------------------------------------------------- Clinic Level of Care Assessment Details [name]. Date of Service: 05/27/2018 11:00 AM Medical Record Number: [id number] Patient Account Number: [id number] Date of Birth/Sex: 04-22-1945 (73 y.o. F) Treating RN: Curtis Sites Primary Care Kamaryn Grimley: Annita Brod Other Clinician: Referring Lexianna Weinrich: Annita Brod Treating Dale Ribeiro/Extender: Linwood Dibbles, HOYT Weeks in Treatment: 52 Clinic Level of Care Assessment Items TOOL 4 Quantity Score []  - Use when only an EandM is performed on FOLLOW-UP  visit 0 ASSESSMENTS - Nursing Assessment / Reassessment X - Reassessment of Co-morbidities (includes updates in patient status) 1 10 X- 1 5 Reassessment of Adherence to Treatment Plan ASSESSMENTS - Wound and Skin Assessment / Reassessment X - Simple Wound Assessment / Reassessment - one wound 1 5 []  - 0 Complex Wound Assessment / Reassessment - multiple wounds []  - 0 Dermatologic / Skin Assessment (not related to wound area) ASSESSMENTS - Focused Assessment []  - Circumferential Edema Measurements - multi extremities 0 []  - 0 Nutritional Assessment / Counseling / Intervention []  - 0 Lower Extremity Assessment (monofilament, tuning fork, pulses) []  - 0 Peripheral Arterial Disease Assessment (using hand held doppler) ASSESSMENTS - Ostomy and/or Continence Assessment and Care []  - Incontinence Assessment and Management 0 []  - 0 Ostomy Care Assessment and Management (repouching, etc.) PROCESS - Coordination of Care X - Simple Patient / Family Education for ongoing care 1 15 []  - 0 Complex (extensive) Patient / Family Education for ongoing care X- 1 10 Staff obtains Chiropractor, Records, Test Results / Process Orders []  - 0 Staff telephones HHA, Nursing Homes / Clarify orders / etc []  - 0 Routine Transfer to another Facility (non-emergent condition) []  - 0 Routine Hospital Admission (non-emergent condition) []  - 0 New Admissions / Manufacturing engineer / Ordering NPWT, Apligraf, etc. []  - 0 Emergency Hospital Admission (emergent condition) X- 1 10 Simple Discharge Coordination LUTHIEN, CADOGAN. ([id number]) []  - 0 Complex (extensive) Discharge Coordination PROCESS - Special Needs []  - Pediatric / Minor Patient Management 0 []  - 0 Isolation Patient Management []  - 0 Hearing / Language / Visual special needs []  - 0 Assessment of Community assistance (transportation, D/C planning, etc.) []  - 0 Additional assistance / Altered mentation []  - 0 Support Surface(s)  Assessment (bed, cushion, seat, etc.) INTERVENTIONS - Wound Cleansing / Measurement X - Simple Wound Cleansing - one wound 1 5 []  - 0 Complex Wound Cleansing -  multiple wounds X- 1 5 Wound Imaging (photographs - any number of wounds) []  - 0 Wound Tracing (instead of photographs) X- 1 5 Simple Wound Measurement - one wound []  - 0 Complex Wound Measurement - multiple wounds INTERVENTIONS - Wound Dressings X - Small Wound Dressing one or multiple wounds 1 10 []  - 0 Medium Wound Dressing one or multiple wounds []  - 0 Large Wound Dressing one or multiple wounds X- 1 5 Application of Medications - topical []  - 0 Application of Medications - injection INTERVENTIONS - Miscellaneous []  - External ear exam 0 []  - 0 Specimen Collection (cultures, biopsies, blood, body fluids, etc.) []  - 0 Specimen(s) / Culture(s) sent or taken to Lab for analysis []  - 0 Patient Transfer (multiple staff / Nurse, adultHoyer Lift / Similar devices) []  - 0 Simple Staple / Suture removal (25 or less) []  - 0 Complex Staple / Suture removal (26 or more) []  - 0 Hypo / Hyperglycemic Management (close monitor of Blood Glucose) []  - 0 Ankle / Brachial Index (ABI) - do not check if billed separately X- 1 5 Vital Signs Kolar, Johnny W. ([id number]) Has the patient been seen at the hospital within the last three years: Yes Total Score: 90 Level Of Care: New/Established - Level 3 Electronic Signature(s) Signed: 05/27/2018 4:52:28 PM By: Curtis Sitesorthy, Joanna Entered By: Curtis Sitesorthy, Joanna on 05/27/2018 12:01:26 Jessica [name], [name] ([id number]) -------------------------------------------------------------------------------- Encounter Discharge Information Details [name], [name] Date of Service: 05/27/2018 11:00 AM Medical Record Number: [id number] Patient Account Number: [id number] Date of Birth/Sex: 1945-11-06 (73 y.o. F) Treating RN: Curtis Sitesorthy, Joanna Primary Care Berthe Oley: Annita BrodASENSO, PHILIP Other  Clinician: Referring Naevia Unterreiner: Annita BrodASENSO, PHILIP Treating Ryver Poblete/Extender: Linwood DibblesSTONE III, HOYT Weeks in Treatment: 2352 Encounter Discharge Information Items Discharge Condition: Stable Ambulatory Status: Stretcher Discharge Destination: Home Transportation: Ambulance Accompanied By: son and EMS Schedule Follow-up Appointment: Yes Clinical Summary of Care: Electronic Signature(s) Signed: 05/27/2018 4:52:28 PM By: Curtis Sitesorthy, Joanna Entered By: Curtis Sitesorthy, Joanna on 05/27/2018 12:01:56 Jessica [name], [name] ([id number]) -------------------------------------------------------------------------------- Lower Extremity Assessment Details [name], [name] Date of Service: 05/27/2018 11:00 AM Medical Record Number: [id number] Patient Account Number: [id number] Date of Birth/Sex: 1945-11-06 (73 y.o. F) Treating RN: Rodell PernaScott, Dajea Primary Care Sally Menard: Annita BrodASENSO, PHILIP Other Clinician: Referring Savvas Roper: Annita BrodASENSO, PHILIP Treating Jamail Cullers/Extender: Linwood DibblesSTONE III, HOYT Weeks in Treatment: 52 Electronic Signature(s) Signed: 05/30/2018 8:46:05 AM By: Rodell PernaScott, Dajea Entered By: Rodell PernaScott, Dajea on 05/27/2018 11:28:28 Jessica [name], [name] ([id number]) -------------------------------------------------------------------------------- Multi Wound Chart Details [name], [name] Date of Service: 05/27/2018 11:00 AM Medical Record Number: [id number] Patient Account Number: [id number] Date of Birth/Sex: 1945-11-06 (73 y.o. F) Treating RN: Curtis Sitesorthy, Joanna Primary Care Austina Constantin: Annita BrodASENSO, PHILIP Other Clinician: Referring Lometa Riggin: Annita BrodASENSO, PHILIP Treating Ommie Degeorge/Extender: STONE III, HOYT Weeks in Treatment: 52 Vital Signs Height(in): 70 Pulse(bpm): 73 Weight(lbs): Blood Pressure(mmHg): 131/59 Body Mass Index(BMI): Temperature(F): 98.1 Respiratory Rate 16 (breaths/min): Photos: [1:No Photos] [N/A:N/A] Wound Location: [1:Medial Sacrum] [N/A:N/A] Wounding Event: [1:Pressure Injury]  [N/A:N/A] Primary Etiology: [1:Pressure Ulcer] [N/A:N/A] Date Acquired: [1:01/15/2017] [N/A:N/A] Weeks of Treatment: [1:52] [N/A:N/A] Wound Status: [1:Open] [N/A:N/A] Measurements L x W x D [1:7.1x5.2x2.2] [N/A:N/A] (cm) Area (cm) : [1:28.997] [N/A:N/A] Volume (cm) : [1:63.793] [N/A:N/A] % Reduction in Area: [1:21.30%] [N/A:N/A] % Reduction in Volume: [1:-73.10%] [N/A:N/A] Starting Position 1 [1:12] (o'clock): Ending Position 1 [1:8] (o'clock): Maximum Distance 1 (cm): [1:2] Undermining: [1:Yes] [N/A:N/A] Classification: [1:Category/Stage IV] [N/A:N/A] Exudate Amount: [1:Medium] [N/A:N/A] Exudate Type: [1:Sanguinous] [N/A:N/A] Exudate Color: [1:red] [N/A:N/A] Foul Odor After Cleansing: [1:Yes] [N/A:N/A] Odor Anticipated Due to [1:No] [  N/A:N/A] Product Use: Wound Margin: [1:Distinct, outline attached] [N/A:N/A] Granulation Amount: [1:Large (67-100%)] [N/A:N/A] Granulation Quality: [1:Red, Pink, Hyper-granulation] [N/A:N/A] Necrotic Amount: [1:Small (1-33%)] [N/A:N/A] Exposed Structures: [1:Fascia: Yes Fat Layer (Subcutaneous Tissue) Exposed: Yes Muscle: Yes Tendon: No Joint: No Bone: No] [N/A:N/A] Epithelialization: None N/A N/A Periwound Skin Texture: Induration: Yes N/A N/A Scarring: Yes Excoriation: No Callus: No Crepitus: No Rash: No Periwound Skin Moisture: Maceration: Yes N/A N/A Dry/Scaly: No Periwound Skin Color: Erythema: Yes N/A N/A Rubor: Yes Atrophie Blanche: No Cyanosis: No Ecchymosis: No Hemosiderin Staining: No Mottled: No Pallor: No Erythema Location: Circumferential N/A N/A Temperature: No Abnormality N/A N/A Tenderness on Palpation: Yes N/A N/A Wound Preparation: Ulcer Cleansing: N/A N/A Rinsed/Irrigated with Saline Topical Anesthetic Applied: Other: lidocaine 4% Treatment Notes Electronic Signature(s) Signed: 05/27/2018 4:52:28 PM By: Curtis Sitesorthy, Joanna Entered By: Curtis Sitesorthy, Joanna on 05/27/2018 12:00:37 Jessica [name], [name]  ([id number]) -------------------------------------------------------------------------------- Multi-Disciplinary Care Plan Details [name], Lariah W. Date of Service: 05/27/2018 11:00 AM Medical Record Number: [id number] Patient Account Number: [id number] Date of Birth/Sex: 1945/09/19 (73 y.o. F) Treating RN: Curtis Sitesorthy, Joanna Primary Care Joeann Steppe: Annita BrodASENSO, PHILIP Other Clinician: Referring Seraphine Gudiel: Annita BrodASENSO, PHILIP Treating Allexa Acoff/Extender: STONE III, HOYT Weeks in Treatment: 2552 Active Inactive Orientation to the Wound Care Program Nursing Diagnoses: Knowledge deficit related to the wound healing center program Goals: Patient/caregiver will verbalize understanding of the Wound Healing Center Program Date Initiated: 06/03/2017 Target Resolution Date: 06/17/2017 Goal Status: Active Interventions: Provide education on orientation to the wound center Notes: Pressure Nursing Diagnoses: Knowledge deficit related to causes and risk factors for pressure ulcer development Knowledge deficit related to management of pressures ulcers Potential for impaired tissue integrity related to pressure, friction, moisture, and shear Goals: Patient will remain free from development of additional pressure ulcers Date Initiated: 06/03/2017 Target Resolution Date: 06/17/2017 Goal Status: Active Patient will remain free of pressure ulcers Date Initiated: 06/03/2017 Target Resolution Date: 06/17/2017 Goal Status: Active Patient/caregiver will verbalize risk factors for pressure ulcer development Date Initiated: 06/03/2017 Target Resolution Date: 06/17/2017 Goal Status: Active Interventions: Assess: immobility, friction, shearing, incontinence upon admission and as needed Assess potential for pressure ulcer upon admission and as needed Notes: Wound/Skin Impairment Jessica BradyOVERMAN, Marka W. ([id number]) Nursing Diagnoses: Impaired tissue integrity Goals: Patient/caregiver will verbalize understanding of  skin care regimen Date Initiated: 06/03/2017 Target Resolution Date: 06/17/2017 Goal Status: Active Ulcer/skin breakdown will have a volume reduction of 30% by week 4 Date Initiated: 06/03/2017 Target Resolution Date: 06/17/2017 Goal Status: Active Interventions: Assess ulceration(s) every visit Treatment Activities: Patient referred to home care : 06/03/2017 Skin care regimen initiated : 06/03/2017 Notes: Electronic Signature(s) Signed: 05/27/2018 4:52:28 PM By: Curtis Sitesorthy, Joanna Entered By: Curtis Sitesorthy, Joanna on 05/27/2018 12:00:27 Jessica [name], [name] ([id number]) -------------------------------------------------------------------------------- Pain Assessment Details [name], Teneisha W. Date of Service: 05/27/2018 11:00 AM Medical Record Number: [id number] Patient Account Number: [id number] Date of Birth/Sex: 1945/09/19 (73 y.o. F) Treating RN: Curtis Sitesorthy, Joanna Primary Care Kaity Pitstick: Annita BrodASENSO, PHILIP Other Clinician: Referring Izaan Kingbird: Annita BrodASENSO, PHILIP Treating Dyamon Sosinski/Extender: STONE III, HOYT Weeks in Treatment: 52 Active Problems Location of Pain Severity and Description of Pain Patient Has Paino No Site Locations Pain Management and Medication Current Pain Management: Electronic Signature(s) Signed: 05/27/2018 4:08:56 PM By: Sallee ProvencalWallace, RCP,RRT,CHT, Sallie RCP, RRT, CHT Signed: 05/27/2018 4:52:28 PM By: Curtis Sitesorthy, Joanna Entered By: Dayton MartesWallace, RCP,RRT,CHT, Sallie on 05/27/2018 11:02:40 Jessica [name], Lakely W. ([id number]) -------------------------------------------------------------------------------- Patient/Caregiver Education Details [name], Marcena W. Date of Service: 05/27/2018 11:00 AM Medical Record Number: [id number] Patient Account Number: [id number] Date of Birth/Gender: 1945/09/19 (  73 y.o. F) Treating RN: Curtis Sites Primary Care Physician: Annita Brod Other Clinician: Referring Physician: Annita Brod Treating Physician/Extender: Skeet Simmer in  Treatment: 73 Education Assessment Education Provided To: Patient and Caregiver Education Topics Provided Pressure: Handouts: Other: pressure relief measures Methods: Explain/Verbal Responses: State content correctly Electronic Signature(s) Signed: 05/27/2018 4:52:28 PM By: Curtis Sites Entered By: Curtis Sites on 05/27/2018 12:02:32 Jessica [name] ([id number]) -------------------------------------------------------------------------------- Wound Assessment Details [name]. Date of Service: 05/27/2018 11:00 AM Medical Record Number: [id number] Patient Account Number: [id number] Date of Birth/Sex: [birth year] (73 y.o. F) Treating RN: Curtis Sites Primary Care Andrej Spagnoli: Annita Brod Other Clinician: Referring Aryaan Persichetti: Annita Brod Treating Tycho Cheramie/Extender: STONE III, HOYT Weeks in Treatment: 52 Wound Status Wound Number: 1 Primary Etiology: Pressure Ulcer Wound Location: Medial Sacrum Wound Status: Open Wounding Event: Pressure Injury Date Acquired: 01/15/2017 Weeks Of Treatment: 52 Clustered Wound: No Photos Photo Uploaded By: Elliot Gurney, BSN, RN, CWS, Kim on 05/27/2018 15:45:22 Wound Measurements Length: (cm) 7.1 % Reducti Width: (cm) 5.2 % Reducti Depth: (cm) 2.2 Epithelia Area: (cm) 28.997 Tunnelin Volume: (cm) 63.793 Undermin Starti Ending Maximu on in Area: 21.3% on in Volume: -73.1% lization: None g: No ing: Yes ng Position (o'clock): 12 Position (o'clock): 8 m Distance: (cm) 2 Wound Description Classification: Category/Stage IV Foul Odor Wound Margin: Distinct, outline attached Due to Pr Exudate Amount: Medium Slough/Fi Exudate Type: Sanguinous Exudate Color: red After Cleansing: Yes oduct Use: No brino Yes Wound Bed Granulation Amount: Large (67-100%) Exposed Structure Granulation Quality: Red, Pink, Hyper-granulation Fascia Exposed: Yes Necrotic Amount: Small (1-33%) Fat Layer (Subcutaneous Tissue) Exposed:  Yes Necrotic Quality: Adherent Slough Tendon Exposed: No Muscle Exposed: Yes JOHNNIE, BARTOLOTTA. ([id number]) Necrosis of Muscle: No Joint Exposed: No Bone Exposed: No Periwound Skin Texture Texture Color No Abnormalities Noted: No No Abnormalities Noted: No Callus: No Atrophie Blanche: No Crepitus: No Cyanosis: No Excoriation: No Ecchymosis: No Induration: Yes Erythema: Yes Rash: No Erythema Location: Circumferential Scarring: Yes Hemosiderin Staining: No Mottled: No Moisture Pallor: No No Abnormalities Noted: No Rubor: Yes Dry / Scaly: No Maceration: Yes Temperature / Pain Temperature: No Abnormality Tenderness on Palpation: Yes Wound Preparation Ulcer Cleansing: Rinsed/Irrigated with Saline Topical Anesthetic Applied: Other: lidocaine 4%, Electronic Signature(s) Signed: 05/27/2018 4:52:28 PM By: Curtis Sites Entered By: Curtis Sites on 05/27/2018 12:00:16 Jessica [name] ([id number]) -------------------------------------------------------------------------------- Vitals Details [name]. Date of Service: 05/27/2018 11:00 AM Medical Record Number: [id number] Patient Account Number: [id number] Date of Birth/Sex: 07-10-1945 (73 y.o. F) Treating RN: Curtis Sites Primary Care Lamaya Hyneman: Annita Brod Other Clinician: Referring Shaarav Ripple: Annita Brod Treating Lavaya Defreitas/Extender: STONE III, HOYT Weeks in Treatment: 52 Vital Signs Time Taken: 11:00 Temperature (F): 98.1 Height (in): 70 Pulse (bpm): 73 Respiratory Rate (breaths/min): 16 Blood Pressure (mmHg): 131/59 Reference Range: 80 - 120 mg / dl Airway Electronic Signature(s) Signed: 05/27/2018 4:08:56 PM By: Dayton Martes RCP, RRT, CHT Entered By: Weyman Rodney, Lucio Edward on 05/27/2018 11:03:08

## 2018-06-10 ENCOUNTER — Encounter: Payer: Medicare Other | Admitting: Physician Assistant

## 2018-06-10 DIAGNOSIS — L89154 Pressure ulcer of sacral region, stage 4: Secondary | ICD-10-CM | POA: Diagnosis not present

## 2018-06-14 NOTE — Progress Notes (Signed)
Jessica, [name] ([id number]) Visit Report for 06/10/2018 Chief Complaint Document Details [name], Jessica [name]. Date of Service: 06/10/2018 11:00 AM Medical Record Number: [id number] Patient Account Number: [id number] Date of Birth/Sex: 1945/10/15 (73 y.o. F) Treating RN: Jessica [name] Primary Care Provider: Annita [name] Other Clinician: Referring Provider: Annita [name] Treating Provider/Extender: Jessica [name], Jessica [name] in Treatment: 36 Information Obtained from: Patient Chief Complaint she is here for evaluation of a sacral ulcer and bilateral lower extremity ulcers Electronic Signature(s) Signed: 06/10/2018 5:19:05 PM By: Jessica Kelp PA-C Entered By: Jessica [name] on 06/10/2018 10:54:15 Jessica [name] ([id number]) -------------------------------------------------------------------------------- HPI Details [name]. Date of Service: 06/10/2018 11:00 AM Medical Record Number: [id number] Patient Account Number: [id number] Date of Birth/Sex: 07-26-1945 (73 y.o. F) Treating RN: Jessica [name] Primary Care Provider: Annita [name] Other Clinician: Referring Provider: Annita [name] Treating Provider/Extender: Jessica [name], Jessica [name] in Treatment: 54 History of Present Illness HPI Description: 05/27/17-she is here in initial evaluation for a left-sided sacral stage IV pressure ulcer and bilateral lower extremity, lateral aspect, unstageable pressure ulcers. She is accompanied by her husband and her son, who are her primary caregivers. She is bedbound secondary to spinal stenosis. According to her son and husband she was hospitalized from 10/5-11/1 with healthcare associated pneumonia and altered mental status. During her hospitalization she was intubated, extubated on 10/27. She was discharged with Foley catheter and follow up with urology. According to her son and spouse she developed the sacral ulcer during hospitalization. Home health has been  applying Santyl daily. She does have a low air loss mattress and is repositioned every 2-3 hours per her family's report. According to the son and spouse she had an appointment with urology on 12/18 and during that appointment developed discoloration to her bilateral lower extremities which ultimately developed into unstageable pressure ulcers to the lateral aspects of her bilateral lower extremities. There has been no topical treatment applied to these. She continues to have home health. There is no concerns expressed regarding dietary intake, stating she eats 3 meals a day, eating was provided; she is supplemented with boost with protein. 06/03/17-she is here in follow-up evaluation for sacral and bilateral lower extremity ulcers. Plain film x-ray done today reveals no distraction to the sacrum or coccyx, no visible abnormalities. Home health has ordered the negative pressure wound system but it has not been initiated. We will continue with Hydrofera Blue until initiation of negative pressure wound system and continue with Santyl to bilateral lower extremity ulcers. Follow-up next week 06/10/17-she is here in follow-up evaluation for sacral and bilateral lower extremity ulcers. The wound VAC will be available tomorrow per home health. We will initiate wound VAC therapy to the sacral ulcer 3 times weekly (Thursday, Saturday/Sunday, Tuesday). We will continue with Santyl to the lower extremity ulcers. The patient's son is checking into home health therapy over the weekend for [facility] changes, with the understanding that if VAC changes cannot be performed over the weekend he will need to change his mother's appointments to Monday, Wednesday or Friday. The sacral ulcer clinically does not appear infected but there has been a change in the amount of drainage acutely, there is no significant amount of devitalized tissue, there is no malodor. Wound culture was obtained to evaluate for occult infection we will  hold off on antibiotic therapy until sensitivities are resulted. 06/18/17 on evaluation today patient appears to be doing fairly well in my opinion although this is the first time I  have seen this patient she has been previously evaluated by Jessica [name] here in our office. She is going to be switching to Fridays to see me due to the Wound VAC schedule being changed on Monday, Wednesday, and Friday. Subsequently she seems to be doing fairly well with the Wound VAC. Her son who was present during evaluation today states that he somewhat stresses of the Wound VAC in making sure that it was functioning appropriately. With that being said everything seems to be working well he knows what settings on the [facility] HVAC itself to look at and ensure that it is functioning properly. Overall the wound appears to be nice and clean there is no need for debridement today. She has no discomfort in her bilateral lower extremity ulcers also appear to be improving based on measurements and what this honest tell me about the overall appearance. 07/02/17 on evaluation today I noted in patients wound bed that was actually an odor that had not previously been noted. Subsequently there was also a small area of bone that was not previously noted during my last evaluation. This did appear to be necrotic and was being somewhat forced out by the body around the region of granulation. She does not have any pain which is good news. Nonetheless the overall appearance of the ulcer is making me concerned for the patient having developed osteomyelitis. Currently she is not been on any antibiotics and the Wound VAC has been doing fairly well in general. With that being said I do not think we need to continue the Wound VAC if she potentially has a bone infection. At least not until it is properly addressed with antibiotics. 07/09/17 on evaluation today patient appears to actually be doing very well in regard to her sacral ulcer compared to last  week. There is actually no exposed bone at this point. 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 With that being said still I do believe she needs to have a referral to infectious disease due to the osteomyelitis I am gonna recommend that she continue with the doxycycline based on the culture results which showed Eikenella Corrodens as the Jessica BradyOVERMAN, [name] ([id number]) organism identified that tetracycline should work for. She does not seem to be having any discomfort whatsoever at this point. 07/16/17 on evaluation today patient actually appears to be doing rather well in regard to her sacral wound. I think that the original wound site actually appears much better than previously noted. With that being said she does have a new superficial injury on the right sacral region which appears to be due to according to the sign transport that occurred for her MRI unfortunately. Fortunately this is not too deep and I do think it can be managed but it was a new area that was not previously noted. Otherwise she has been tolerating the Dakinos soaked [name] packing without complication. 07/30/17 on evaluation today patient presents for reevaluation concerning her sacral wound. She has been tolerating the dressing changes without complication. With that being said her wound is doing so well at this point that I think she may be at the point where we could reinitiate the Wound VAC currently and hopefully see good results from this. I'm very pleased with how she has responded to the Dakinos soaked [name] packing. 08/13/17 evaluation today patient appears to be doing excellent in  regard to her lower extremity ulcer this seems to be cleaning up very nicely. In regard to the sacral ulcer the area of trauma on the right lateral portion of the wound actually appears  to be much better than previously noted during the last evaluation. The word has definitely filled in and the Wound VAC appears to be helpful I do believe. Overall I'm very pleased with how things seem to be progressing at this time. Patient likewise is also happy that things are doing well. 08/27/17 on evaluation today patient presents for follow-up concerning her ongoing issues with her sacral ulcer and lower extremity ulcer. Fortunately she has been tolerating the dressing changes well complication the Wound VAC in general seems to have done very well up to this point. She does not have any evidence of infection which is good news. She does have some dark discoloration in central portion of the wound which is troublesome for the possibility of pressure injury to the site it sounds like her prior air mattress was not functioning properly her son has just bought a new one for her this is doing much better. Otherwise things seem to be progressing nicely. 09/10/17 on evaluation today patient presents for follow-up concerning her sacral wound and lower extremity ulcer. She has been tolerating the switch of her dressing changes to the Dakinos soaked [name] dressing very well in regard to the sacrum. The left lateral lower extremity ulcer appears to have a new area open just distal to the one that we have been treating with Santyl and this is new since her last visit. There does not appear to be any evidence of infection which is good news. With that being said there's a lot of maceration here at the site and I feel this is likely due to the switch and the dressings it appears that due to the sticking of the dressings it will switch to utilizing a telfa pad over the Santyl. I think this caused a lot of drainage to be collected and situated in the region just under the dressing which has led to this causing which duration of breakdown. Overall there does not appear to be any significant pain which is good  news. Of note I did actually have a conversation with the radiologist who is an interventional radiologist with [facility]. 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 With that being said there does not appear to be any rush to get this done according to the interventional radiologist whom I spoke with. Therefore after discussion it was determined that there gonna wait about six months prior to considering the procedure to get time for hopefully the sacral wound to heal in the interim to at least some degree. 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 According to nursing staff these are all improved, the negative pressure wound therapy system was put on hold last week. We will resume negative pressure wound therapy today, continue with Santyl to the lower extremity and she will follow-up next week. 09/24/17 on evaluation today unfortunately the patient's wound appears to be doing significantly worse compared to last week's evaluation and even my valuation the week before. She actually has bone noted on the left wound margin in the region of undermining unfortunately. This was not noted during the last evaluation. I am concerned about the fact that this seems to be worse not better since  our last visit with her. My biggest concern is that she's likely developing a worsening osteomyelitis. This was discussed with patient and her son today during the office visit. 05/03/17 on evaluation today patient actually appears to be doing rather well in regard to her sacral ulcer compared to last week's evaluation. She has been tolerating the dressing changes without complication. Fortunately even though we're not utilizing the Wound VAC she seems to be making some strides in seeing the wound area overall improved quite dramatically in my pinion in just one [name] time. She also seems to  be staying off of this to the point that I do not see any evidence of new injury which is excellent news. Whatever she's been doing in that regard over the past week I would want her to continue. I did also review the results of her bone culture which revealed group B strep as the responsible organism. Again this is what's causing her osteomyelitis she does have infectious disease appointment scheduled for 10/11/17 10/08/1898 valuation today patient appears to be doing better for the most part in regard to the sacral wound. Overall she is showing signs of having granulation of the bone which is good news. There is an area towards the left of the wound where she does seem to be showing some signs of opening this it would be due to additional pressure to the area unfortunately. Jessica [name], Jessica [name]. ([id number]) There does not appear to be any evidence of infection spreading which is good news that do not see any evidence of significant purulent discharge which is also good news. 10/26/17 on evaluation today patient sacral ulcer actually appears to be very healthy and doing well in regard to the overall appearance of the wound. With that being said she does seem to be having some issues at this point in time with some shear/friction injury of the sacrum from where she was transported to [facility] for her infectious disease appointment. She has been placed on IV antibiotic therapy I will have to go back and find that note for review as I did not have access to that today. Nonetheless I will add the next visit be sure to document the exact antibiotics that she is taking at this point. Nonetheless I do believe the wound is doing better there's no bone exposure nothing that requires debridement in regard to the sacral wound. Likewise her left lateral lower extremity ulcer also appears to be doing significantly better. In general I feel like things are showing signs of improvement all around. 11/12/17 on  evaluation today patient appears to be doing rather well in regard to her sacral wound. In general I do see signs of granulation at this point. Fortunately there does not appear to be any evidence of infection which is good news as well. In general overall happy with the appearance. With that being said I'll be see I would like for this to be progressing more rapidly as with the patient and her son but nonetheless at least things do not appear to be doing worse. Her lower extremity ulcer is doing significantly better. 11/26/17 on evaluation today patient appears to be doing a little better in regard to the sacral wound especially in the peripheral of the wound bed. She actually did have an abrasion on the right gluteal region that's completely resolved to this point. Her lower extremity also appears to be for the most part completely resolved. Overall the sacrum itself seems to be the one thing that is  still open and given her trouble. No fevers, chills, nausea, or vomiting noted at this time. She actually has an appointment next week with infectious disease for recheck to see how things are doing. She maybe switch to oral medications at that point. 12/09/17 on evaluation today patient actually appears to be doing much better in regard to her sacral wound. I do feel like she has made good progress at this time as far as healing is concerned. In fact the wound looks better to me today than it has in quite some time. She is on oral antibiotic therapy and no longer on the IV antibiotics. She did see infectious disease last week. 12/24/17 on evaluation today patient's wounds actually appear to be doing decently well in regard to the sacral wound in particular. When it comes to her lower extremity ulcers both appear to be completely healed at this point in the one area where she had lived the rash has completely resolved at this time. Overall very pleased with the progress that has been made. Nonetheless  the patient seems to be doing well in general. She still does have quite a bit of healing to do in regard to the sacral wound but I feel like she is making progress. 01/07/18 on evaluation today patient actually appears to be doing excellent today regard to her sacral wound. The granular quality in the base of the wound is excellent and shown signs of good improvement there's no evidence of contusion nor deep tissue injury and the periwound actually appears to be doing well. In general I'm very pleased with the overall appearance. 02/04/18 on evaluation today patient's wound actually appears to be doing decently well as far as the granulation is concerned. She has been tolerating the dressing changes without complication. Right now we are still using the Wound VAC. Nonetheless overall there apparently is some cater to the wound and this is concerning simply for the fact that she could be developing a soft tissue infection again. She's had this happen in the past and at that time responded very well to the antibiotics. Nonetheless I don't think I would likely put her on anything prophylactically but rather wait for the results of the culture to return. 02/18/18 on evaluation today patient actually appears to be doing fairly well. She has been tolerating the dressing changes. And the Wound VAC seems to be doing well. Overall I'm very pleased with how things have improved over the past couple [name] since I last saw her. Fortunately there does not appear to be any evidence of overt infection at this time which is good news. She has been taking the antibiotics without complication. Specifically that is the Bactrim DS which was prescribed on 02/08/18. 03/18/18 on evaluation today patient's wound actually does have quite a bit of odor noted compared to previous. With that being said the wound itself overall does not appear to be too significant or worse. There is no event noted in the wound bed which is good  news. With that being said the patient has been worried as the home health nurse stated that she thought she saw bone in the base of the wound. Nonetheless there's a little bit of bruising along the left lateral border of the wound bed which again does not appear to be terrible we have noticed this before but other than this the majority the wound bed appears to be doing excellent in my pinion. I am wondering if there may be some bacterial colonization. This  could be leading to some of the odor that we are noting with the Wound VAC. Jessica BradyOVERMAN, [name] ([id number]) 04/01/18 on evaluation today patient appears to actually be doing well in regard to the overall appearance of the wound itself. She also does not have any odor at this point in regard to the wound surface and I do believe the Bacons is extremely helpful in this regard. With that being said she does have a little bit of deep tissue injury on the medial aspect of the wound. Again last time it was more lateral. The lateral seems to have improved quite well. Nonetheless she also notes by way of her son that this has been very macerated in the past several [name] since I last saw her. Though things overall seem to be doing better I still think that the Wound VAC may help with managing her fluid better I'm wondering if we can attempt a [name] Wound VAC my hope is that this will be able to manage the moisture control as well as continuing with managing the odor of the wound and bacterial load since the Dakinos has done excellent in helping in that regard. 04/15/17 on evaluation today patient actually appears to be doing much better in regard to the overall appearance of her sacral wound. I do believe that the [name] Vac has been of benefit for her. Overall she is tolerating this without complication which is also good news. There is no sign of injury to the sacral region at this point. 04/29/18 on evaluation today patient appears to be doing well in  regard to her sacral ulcer. She sure signs of good granulation and the [name] VAC is doing very well for her. Overall I'm pleased in this regard. The biggest issue is that the time from Friday till Monday seems to be quite long for her as far as the amount of time to keep the Wound VAC sealed. For that reason her son wonders if he can take this off on Sunday morning in order to give her day of just wet to dry dressing's with the Jessica's solution I think this would be okay. 05/13/18 on evaluation today patient appears to be doing okay in regard to her sacral ulcer. There does not appear to be any evidence of overt infection at this time which is good news. In general the drainage and moisture appears to be significantly improved this was after the pressure on the Wound VAC was increased after they contacted our office. With that being said a lot of the dark tissue around the edge of the wound has improved in the maceration is significantly better this seems to be managing much better. Fortunately there is no signs of infection at this time. Overall the patient is doing quite well. The wound measurements are not significantly smaller today although they were over the past two visits we will see how things go. 05/27/18 on evaluation today patient's sacral wound actually appears to be doing fairly well at this point. She does have a deep tissue injury yet again on the medial aspect of the wound which is something that we seem to be seeing often. There is some question as to whether or not this is actually being caused by the Wound VAC bunching up and then her somewhat laying on it even though they are attempting to offload this as best as possible. Patient son states that overall they do the best they can trying offload her and I definitely believe him. I  may want to see about putting the Wound VAC on hold for a couple [name] and see if this happens just with standard dressing changes. 06/10/18 on evaluation  today patient actually appears to be doing better in regard to her sacral wound. Overall there's no signs of infection, no odor, and no significant skin breakdown around the edge of the wound I feel like this is done much better 50 Dakinos soaked [name] dressing as opposed to legalizing anything such as the Wound VAC. I believe that we discontinue the Wound VAC as of today. Electronic Signature(s) Signed: 06/10/2018 5:19:05 PM By: Jessica Kelp PA-C Entered By: Jessica [name] on 06/10/2018 15:16:32 Jessica [name] ([id number]) -------------------------------------------------------------------------------- Physical Exam Details [name]. Date of Service: 06/10/2018 11:00 AM Medical Record Number: [id number] Patient Account Number: [id number] Date of Birth/Sex: 1946-01-15 (72 y.o. F) Treating RN: Jessica [name] Primary Care Provider: Annita [name] Other Clinician: Referring Provider: Annita [name] Treating Provider/Extender: STONE III, Jessica [name] in Treatment: 37 Constitutional Well-nourished and well-hydrated in no acute distress. Respiratory normal breathing without difficulty. clear to auscultation bilaterally. Cardiovascular regular rate and rhythm with normal S1, S2. Psychiatric this patient is able to make decisions and demonstrates good insight into disease process. Alert and Oriented x 3. pleasant and cooperative. Notes Patient's wound bed actually shows again good granulation there's no evidence of bone exposure at this point which is excellent news. Overall I've been very pleased with the progress that she's making and the fact that this appears to be much better compared to last time I saw her when we were still utilizing the Wound VAC. Electronic Signature(s) Signed: 06/10/2018 5:19:05 PM By: Jessica Kelp PA-C Entered By: Jessica [name] on 06/10/2018 15:17:06 Jessica [name]  ([id number]) -------------------------------------------------------------------------------- Physician Orders Details [name]. Date of Service: 06/10/2018 11:00 AM Medical Record Number: [id number] Patient Account Number: [id number] Date of Birth/Sex: 18-Dec-1945 (73 y.o. F) Treating RN: Jessica [name] Primary Care Provider: Annita [name] Other Clinician: Referring Provider: Annita [name] Treating Provider/Extender: Jessica [name], Jessica [name] in Treatment: 14 Verbal / Phone Orders: No Diagnosis Coding ICD-10 Coding Code Description L89.154 Pressure ulcer of sacral region, stage 4 M48.00 Spinal stenosis, site unspecified R54 Age-related physical debility G89.4 Chronic pain syndrome E78.2 Mixed hyperlipidemia L89.93 Pressure ulcer of unspecified site, stage 3 Wound Cleansing Wound #1 Medial Sacrum o Other: - please cleanse sacral wound with dakins moistened [name] - do not spray dakins on wound Anesthetic (add to Medication List) Wound #1 Medial Sacrum o Topical Lidocaine 4% cream applied to wound bed prior to debridement (In Clinic Only). Skin Barriers/Peri-Wound Care Wound #1 Medial Sacrum o Skin Prep Primary Wound Dressing Wound #1 Medial Sacrum o Pack wound with: - Jessica's soaked [name] Secondary Dressing Wound #1 Medial Sacrum o ABD pad - secure with tape Dressing Change Frequency Wound #1 Medial Sacrum o Change dressing twice daily. Follow-up Appointments Wound #1 Medial Sacrum o Return Appointment in 2 [name]. Off-Loading Wound #1 Medial Sacrum o Turn and reposition every 2 hours Jessica [name], [name] ([id number]) Additional Orders / Instructions Wound #1 Medial Sacrum o Increase protein intake. Home Health Wound #1 Medial Sacrum o Continue Home Health Visits - Advanced o Home Health Nurse may visit PRN to address patientos wound care needs. o FACE TO FACE ENCOUNTER: MEDICARE and MEDICAID PATIENTS: I certify that this patient  is under my care and that I had a face-to-face encounter that meets the physician face-to-face encounter requirements with this  patient on this date. The encounter with the patient was in whole or in part for the following MEDICAL CONDITION: (primary reason for Home Healthcare) MEDICAL NECESSITY: I certify, that based on my findings, NURSING services are a medically necessary home health service. HOME BOUND STATUS: I certify that my clinical findings support that this patient is homebound (i.e., Due to illness or injury, pt requires aid of supportive devices such as crutches, cane, wheelchairs, walkers, the use of special transportation or the assistance of another person to leave their place of residence. There is a normal inability to leave the home and doing so requires considerable and taxing effort. Other absences are for medical reasons / religious services and are infrequent or of short duration when for other reasons). o If current dressing causes regression in wound condition, may D/C ordered dressing product/s and apply Normal Saline Moist Dressing daily until next Wound Healing Center / Other MD appointment. Notify Wound Healing Center of regression in wound condition at [phone]. o Please direct any NON-WOUND related issues/requests for orders to patient's Primary Care Physician Negative Pressure Wound Therapy Wound #1 Medial Sacrum o Discontinue NPWT. Electronic Signature(s) Signed: 06/10/2018 5:19:05 PM By: Jessica Kelp PA-C Signed: 06/13/2018 4:00:49 PM By: Elliot Gurney, BSN, RN, CWS, Kim RN, BSN Entered By: Elliot Gurney, BSN, RN, CWS, Kim on 06/10/2018 12:07:30 Jessica [name] ([id number]) -------------------------------------------------------------------------------- Problem List Details [name], Jessica [name]. Date of Service: 06/10/2018 11:00 AM Medical Record Number: [id number] Patient Account Number: [id number] Date of Birth/Sex: [birth year] (73 y.o. F) Treating  RN: Jessica [name] Primary Care Provider: Annita [name] Other Clinician: Referring Provider: Annita [name] Treating Provider/Extender: Jessica [name], Jessica [name] in Treatment: 54 Active Problems ICD-10 Evaluated Encounter Code Description Active Date Today Diagnosis L89.154 Pressure ulcer of sacral region, stage 4 05/27/2017 No Yes M48.00 Spinal stenosis, site unspecified 05/27/2017 No Yes R54 Age-related physical debility 05/27/2017 No Yes G89.4 Chronic pain syndrome 05/27/2017 No Yes E78.2 Mixed hyperlipidemia 05/27/2017 No Yes L89.93 Pressure ulcer of unspecified site, stage 3 05/27/2017 No Yes Inactive Problems Resolved Problems Electronic Signature(s) Signed: 06/10/2018 5:19:05 PM By: Jessica Kelp PA-C Entered By: Jessica [name] on 06/10/2018 10:54:10 Jessica [name] ([id number]) -------------------------------------------------------------------------------- Progress Note Details [name]. Date of Service: 06/10/2018 11:00 AM Medical Record Number: [id number] Patient Account Number: [id number] Date of Birth/Sex: 10-25-1945 (73 y.o. F) Treating RN: Jessica [name] Primary Care Provider: Annita [name] Other Clinician: Referring Provider: Annita [name] Treating Provider/Extender: Jessica [name], Jessica [name] in Treatment: 54 Subjective Chief Complaint Information obtained from Patient she is here for evaluation of a sacral ulcer and bilateral lower extremity ulcers History of Present Illness (HPI) 05/27/17-she is here in initial evaluation for a left-sided sacral stage IV pressure ulcer and bilateral lower extremity, lateral aspect, unstageable pressure ulcers. She is accompanied by her husband and her son, who are her primary caregivers. She is bedbound secondary to spinal stenosis. According to her son and husband she was hospitalized from 10/5-11/1 with healthcare associated pneumonia and altered mental status. During her hospitalization she was intubated, extubated  on 10/27. She was discharged with Foley catheter and follow up with urology. According to her son and spouse she developed the sacral ulcer during hospitalization. Home health has been applying Santyl daily. She does have a low air loss mattress and is repositioned every 2-3 hours per her family's report. According to the son and spouse she had an appointment with urology on 12/18 and during that appointment developed discoloration to her  bilateral lower extremities which ultimately developed into unstageable pressure ulcers to the lateral aspects of her bilateral lower extremities. There has been no topical treatment applied to these. She continues to have home health. There is no concerns expressed regarding dietary intake, stating she eats 3 meals a day, eating was provided; she is supplemented with boost with protein. 06/03/17-she is here in follow-up evaluation for sacral and bilateral lower extremity ulcers. Plain film x-ray done today reveals no distraction to the sacrum or coccyx, no visible abnormalities. Home health has ordered the negative pressure wound system but it has not been initiated. We will continue with Hydrofera Blue until initiation of negative pressure wound system and continue with Santyl to bilateral lower extremity ulcers. Follow-up next week 06/10/17-she is here in follow-up evaluation for sacral and bilateral lower extremity ulcers. The wound VAC will be available tomorrow per home health. We will initiate wound VAC therapy to the sacral ulcer 3 times weekly (Thursday, Saturday/Sunday, Tuesday). We will continue with Santyl to the lower extremity ulcers. The patient's son is checking into home health therapy over the weekend for [facility] changes, with the understanding that if VAC changes cannot be performed over the weekend he will need to change his mother's appointments to Monday, Wednesday or Friday. The sacral ulcer clinically does not appear infected but there has been a  change in the amount of drainage acutely, there is no significant amount of devitalized tissue, there is no malodor. Wound culture was obtained to evaluate for occult infection we will hold off on antibiotic therapy until sensitivities are resulted. 06/18/17 on evaluation today patient appears to be doing fairly well in my opinion although this is the first time I have seen this patient she has been previously evaluated by Jessica Ruiz here in our office. She is going to be switching to Fridays to see me due to the Wound VAC schedule being changed on Monday, Wednesday, and Friday. Subsequently she seems to be doing fairly well with the Wound VAC. Her son who was present during evaluation today states that he somewhat stresses of the Wound VAC in making sure that it was functioning appropriately. With that being said everything seems to be working well he knows what settings on the [facility] itself to look at and ensure that it is functioning properly. Overall the wound appears to be nice and clean there is no need for debridement today. She has no discomfort in her bilateral lower extremity ulcers also appear to be improving based on measurements and what this honest tell me about the overall appearance. 07/02/17 on evaluation today I noted in patients wound bed that was actually an odor that had not previously been noted. Subsequently there was also a small area of bone that was not previously noted during my last evaluation. This did appear to be necrotic and was being somewhat forced out by the body around the region of granulation. She does not have any pain which is good news. Nonetheless the overall appearance of the ulcer is making me concerned for the patient having developed osteomyelitis. Currently she is not been on any antibiotics and the Wound VAC has been doing fairly well in general. With that being said I do not think we need to continue the Wound VAC if she potentially has a bone infection. At least  not until it is properly addressed with antibiotics. Jessica [name], Jessica [name] ([id number]) 07/09/17 on evaluation today patient appears to actually be doing very well in regard to her sacral ulcer  compared to last week. There is actually no exposed bone at this point. 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 With that being said still I do believe she needs to have a referral to infectious disease due to the osteomyelitis I am gonna recommend that she continue with the doxycycline based on the culture results which showed Eikenella Corrodens as the organism identified that tetracycline should work for. She does not seem to be having any discomfort whatsoever at this point. 07/16/17 on evaluation today patient actually appears to be doing rather well in regard to her sacral wound. I think that the original wound site actually appears much better than previously noted. With that being said she does have a new superficial injury on the right sacral region which appears to be due to according to the sign transport that occurred for her MRI unfortunately. Fortunately this is not too deep and I do think it can be managed but it was a new area that was not previously noted. Otherwise she has been tolerating the Jessica [name] packing without complication. 07/30/17 on evaluation today patient presents for reevaluation concerning her sacral wound. She has been tolerating the dressing changes without complication. With that being said her wound is doing so well at this point that I think she may be at the point where we could reinitiate the Wound VAC currently and hopefully see good results from this. I'm very pleased with how she has responded to the [facility] s soaked [name] packing. 08/13/17 evaluation today patient appears to be doing excellent in regard to  her lower extremity ulcer this seems to be cleaning up very nicely. In regard to the sacral ulcer the area of trauma on the right lateral portion of the wound actually appears to be much better than previously noted during the last evaluation. The word has definitely filled in and the Wound VAC appears to be helpful I do believe. Overall I'm very pleased with how things seem to be progressing at this time. Patient likewise is also happy that things are doing well. 08/27/17 on evaluation today patient presents for follow-up concerning her ongoing issues with her sacral ulcer and lower extremity ulcer. Fortunately she has been tolerating the dressing changes well complication the Wound VAC in general seems to have done very well up to this point. She does not have any evidence of infection which is good news. She does have some dark discoloration in central portion of the wound which is troublesome for the possibility of pressure injury to the site it sounds like her prior air mattress was not functioning properly her son has just bought a new one for her this is doing much better. Otherwise things seem to be progressing nicely. 09/10/17 on evaluation today patient presents for follow-up concerning her sacral wound and lower extremity ulcer. She has been tolerating the switch of her dressing changes to the Jessica [name] dressing very well in regard to the sacrum. The left lateral lower extremity ulcer appears to have a new area open just distal to the one that we have been treating with Santyl and this is new since her last visit. There does not appear to be any evidence of infection which is good news. With that being said there's a lot of maceration here at the site and I feel this is  likely due to the switch and the dressings it appears that due to the sticking of the dressings it will switch to utilizing a telfa pad over the Santyl. I think this caused a lot of drainage to be collected and  situated in the region just under the dressing which has led to this causing which duration of breakdown. Overall there does not appear to be any significant pain which is good news. Of note I did actually have a conversation with the radiologist who is an interventional radiologist with [facility]. 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 With that being said there does not appear to be any rush to get this done according to the interventional radiologist whom I spoke with. Therefore after discussion it was determined that there gonna wait about six months prior to considering the procedure to get time for hopefully the sacral wound to heal in the interim to at least some degree. 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 According to nursing staff these are all improved, the negative pressure wound therapy system was put on hold last week. We will resume negative pressure wound therapy today, continue with Santyl to the lower extremity and she will follow-up next week. 09/24/17 on evaluation today unfortunately the patient's wound appears to be doing significantly worse compared to last week's evaluation and even my valuation the week before. She actually has bone noted on the left wound margin in the region of undermining unfortunately. This was not noted during the last evaluation. I am concerned about the fact that this seems to be worse not better since our last visit with her. My biggest concern is that she's likely developing a worsening osteomyelitis. This was discussed with patient and her son today during the office visit. 05/03/17 on evaluation today patient actually appears to be doing rather well in regard to her sacral ulcer compared to last week's evaluation. She has been tolerating the dressing changes without complication. Fortunately even though we're  not utilizing the Wound VAC she seems to be making some strides in seeing the wound area overall improved quite dramatically in my pinion in just one [name] time. She also seems to be staying off of this to the point that I do not see any evidence of new injury which is excellent news. Whatever she's been doing in that regard over the past week I would want her to continue. Jessica [name], Jessica [name] ([id number]) did also review the results of her bone culture which revealed group B strep as the responsible organism. Again this is what's causing her osteomyelitis she does have infectious disease appointment scheduled for 10/11/17 10/08/1898 valuation today patient appears to be doing better for the most part in regard to the sacral wound. Overall she is showing signs of having granulation of the bone which is good news. There is an area towards the left of the wound where she does seem to be showing some signs of opening this it would be due to additional pressure to the area unfortunately. There does not appear to be any evidence of infection spreading which is good news that do not see any evidence of significant purulent discharge which is also good news. 10/26/17 on evaluation today patient sacral ulcer actually appears to be very healthy and doing well in regard to the overall appearance of the wound. With that being said she does  seem to be having some issues at this point in time with some shear/friction injury of the sacrum from where she was transported to [facility] for her infectious disease appointment. She has been placed on IV antibiotic therapy I will have to go back and find that note for review as I did not have access to that today. Nonetheless I will add the next visit be sure to document the exact antibiotics that she is taking at this point. Nonetheless I do believe the wound is doing better there's no bone exposure nothing that requires debridement in regard to the sacral wound. Likewise  her left lateral lower extremity ulcer also appears to be doing significantly better. In general I feel like things are showing signs of improvement all around. 11/12/17 on evaluation today patient appears to be doing rather well in regard to her sacral wound. In general I do see signs of granulation at this point. Fortunately there does not appear to be any evidence of infection which is good news as well. In general overall happy with the appearance. With that being said I'll be see I would like for this to be progressing more rapidly as with the patient and her son but nonetheless at least things do not appear to be doing worse. Her lower extremity ulcer is doing significantly better. 11/26/17 on evaluation today patient appears to be doing a little better in regard to the sacral wound especially in the peripheral of the wound bed. She actually did have an abrasion on the right gluteal region that's completely resolved to this point. Her lower extremity also appears to be for the most part completely resolved. Overall the sacrum itself seems to be the one thing that is still open and given her trouble. No fevers, chills, nausea, or vomiting noted at this time. She actually has an appointment next week with infectious disease for recheck to see how things are doing. She maybe switch to oral medications at that point. 12/09/17 on evaluation today patient actually appears to be doing much better in regard to her sacral wound. I do feel like she has made good progress at this time as far as healing is concerned. In fact the wound looks better to me today than it has in quite some time. She is on oral antibiotic therapy and no longer on the IV antibiotics. She did see infectious disease last week. 12/24/17 on evaluation today patient's wounds actually appear to be doing decently well in regard to the sacral wound in particular. When it comes to her lower extremity ulcers both appear to be completely  healed at this point in the one area where she had lived the rash has completely resolved at this time. Overall very pleased with the progress that has been made. Nonetheless the patient seems to be doing well in general. She still does have quite a bit of healing to do in regard to the sacral wound but I feel like she is making progress. 01/07/18 on evaluation today patient actually appears to be doing excellent today regard to her sacral wound. The granular quality in the base of the wound is excellent and shown signs of good improvement there's no evidence of contusion nor deep tissue injury and the periwound actually appears to be doing well. In general I'm very pleased with the overall appearance. 02/04/18 on evaluation today patient's wound actually appears to be doing decently well as far as the granulation is concerned. She has been tolerating the dressing changes without complication.  Right now we are still using the Wound VAC. Nonetheless overall there apparently is some cater to the wound and this is concerning simply for the fact that she could be developing a soft tissue infection again. She's had this happen in the past and at that time responded very well to the antibiotics. Nonetheless I don't think I would likely put her on anything prophylactically but rather wait for the results of the culture to return. 02/18/18 on evaluation today patient actually appears to be doing fairly well. She has been tolerating the dressing changes. And the Wound VAC seems to be doing well. Overall I'm very pleased with how things have improved over the past couple [name] since I last saw her. Fortunately there does not appear to be any evidence of overt infection at this time which is good news. She has been taking the antibiotics without complication. Specifically that is the Bactrim DS which was prescribed on 02/08/18. 03/18/18 on evaluation today patient's wound actually does have quite a bit of odor  noted compared to previous. With that Jessica [name], Jessica [name]. ([id number]) being said the wound itself overall does not appear to be too significant or worse. There is no event noted in the wound bed which is good news. With that being said the patient has been worried as the home health nurse stated that she thought she saw bone in the base of the wound. Nonetheless there's a little bit of bruising along the left lateral border of the wound bed which again does not appear to be terrible we have noticed this before but other than this the majority the wound bed appears to be doing excellent in my pinion. I am wondering if there may be some bacterial colonization. This could be leading to some of the odor that we are noting with the Wound VAC. 04/01/18 on evaluation today patient appears to actually be doing well in regard to the overall appearance of the wound itself. She also does not have any odor at this point in regard to the wound surface and I do believe the Bacons is extremely helpful in this regard. With that being said she does have a little bit of deep tissue injury on the medial aspect of the wound. Again last time it was more lateral. The lateral seems to have improved quite well. Nonetheless she also notes by way of her son that this has been very macerated in the past several [name] since I last saw her. Though things overall seem to be doing better I still think that the Wound VAC may help with managing her fluid better I'm wondering if we can attempt a [name] Wound VAC my hope is that this will be able to manage the moisture control as well as continuing with managing the odor of the wound and bacterial load since the Jessica s has done excellent in helping in that regard. 04/15/17 on evaluation today patient actually appears to be doing much better in regard to the overall appearance of her sacral wound. I do believe that the [name] Vac has been of benefit for her. Overall she is tolerating  this without complication which is also good news. There is no sign of injury to the sacral region at this point. 04/29/18 on evaluation today patient appears to be doing well in regard to her sacral ulcer. She sure signs of good granulation and the [name] VAC is doing very well for her. Overall I'm pleased in this regard. The biggest issue is that  the time from Friday till Monday seems to be quite long for her as far as the amount of time to keep the Wound VAC sealed. For that reason her son wonders if he can take this off on Sunday morning in order to give her day of just wet to dry dressing's with the Jessica's solution I think this would be okay. 05/13/18 on evaluation today patient appears to be doing okay in regard to her sacral ulcer. There does not appear to be any evidence of overt infection at this time which is good news. In general the drainage and moisture appears to be significantly improved this was after the pressure on the Wound VAC was increased after they contacted our office. With that being said a lot of the dark tissue around the edge of the wound has improved in the maceration is significantly better this seems to be managing much better. Fortunately there is no signs of infection at this time. Overall the patient is doing quite well. The wound measurements are not significantly smaller today although they were over the past two visits we will see how things go. 05/27/18 on evaluation today patient's sacral wound actually appears to be doing fairly well at this point. She does have a deep tissue injury yet again on the medial aspect of the wound which is something that we seem to be seeing often. There is some question as to whether or not this is actually being caused by the Wound VAC bunching up and then her somewhat laying on it even though they are attempting to offload this as best as possible. Patient son states that overall they do the best they can trying offload her and I  definitely believe him. I may want to see about putting the Wound VAC on hold for a couple [name] and see if this happens just with standard dressing changes. 06/10/18 on evaluation today patient actually appears to be doing better in regard to her sacral wound. Overall there's no signs of infection, no odor, and no significant skin breakdown around the edge of the wound I feel like this is done much better 50 Jessica [name] dressing as opposed to legalizing anything such as the Wound VAC. I believe that we discontinue the Wound VAC as of today. Patient History Information obtained from Patient. Family History No family history of Cancer, Diabetes, Heart Disease. Social History Never smoker, Marital Status - Married. Review of Systems (ROS) Constitutional Symptoms (General Health) Denies complaints or symptoms of Fever, Chills. Respiratory The patient has no complaints or symptoms. Jessica [name], Jessica [name] ([id number]) Cardiovascular The patient has no complaints or symptoms. Psychiatric The patient has no complaints or symptoms. Objective Constitutional Well-nourished and well-hydrated in no acute distress. Vitals Time Taken: 11:01 AM, Height: 70 in, Temperature: 98.3 F, Pulse: 73 bpm, Respiratory Rate: 16 breaths/min, Blood Pressure: 132/58 mmHg. Respiratory normal breathing without difficulty. clear to auscultation bilaterally. Cardiovascular regular rate and rhythm with normal S1, S2. Psychiatric this patient is able to make decisions and demonstrates good insight into disease process. Alert and Oriented x 3. pleasant and cooperative. General Notes: Patient's wound bed actually shows again good granulation there's no evidence of bone exposure at this point which is excellent news. Overall I've been very pleased with the progress that she's making and the fact that this appears to be much better compared to last time I saw her when we were still utilizing the Wound  VAC. Integumentary (Hair, Skin) Wound #1 status is Open.  Original cause of wound was Pressure Injury. The wound is located on the Medial Sacrum. The wound measures 7.7cm length x 4.7cm width x 2.4cm depth; 28.424cm^2 area and 68.217cm^3 volume. There is muscle, Fat Layer (Subcutaneous Tissue) Exposed, and fascia exposed. There is no tunneling or undermining noted. There is a medium amount of sanguinous drainage noted. Foul odor after cleansing was noted. The wound margin is distinct with the outline attached to the wound base. There is large (67-100%) red, pink, hyper - granulation within the wound bed. There is a small (1-33%) amount of necrotic tissue within the wound bed including Adherent Slough. The periwound skin appearance exhibited: Induration, Scarring, Maceration, Rubor, Erythema. The periwound skin appearance did not exhibit: Callus, Crepitus, Excoriation, Rash, Dry/Scaly, Atrophie Blanche, Cyanosis, Ecchymosis, Hemosiderin Staining, Mottled, Pallor. The surrounding wound skin color is noted with erythema which is circumferential. Periwound temperature was noted as No Abnormality. The periwound has tenderness on palpation. Assessment Active Problems ICD-10 Pressure ulcer of sacral region, stage 4 Jessica BradyOVERMAN, Margurete W. ([id number]) Spinal stenosis, site unspecified Age-related physical debility Chronic pain syndrome Mixed hyperlipidemia Pressure ulcer of unspecified site, stage 3 Plan Wound Cleansing: Wound #1 Medial Sacrum: Other: - please cleanse sacral wound with dakins moistened [name] - do not spray dakins on wound Anesthetic (add to Medication List): Wound #1 Medial Sacrum: Topical Lidocaine 4% cream applied to wound bed prior to debridement (In Clinic Only). Skin Barriers/Peri-Wound Care: Wound #1 Medial Sacrum: Skin Prep Primary Wound Dressing: Wound #1 Medial Sacrum: Pack wound with: - Jessica's soaked [name] Secondary Dressing: Wound #1 Medial Sacrum: ABD pad -  secure with tape Dressing Change Frequency: Wound #1 Medial Sacrum: Change dressing twice daily. Follow-up Appointments: Wound #1 Medial Sacrum: Return Appointment in 2 [name]. Off-Loading: Wound #1 Medial Sacrum: Turn and reposition every 2 hours Additional Orders / Instructions: Wound #1 Medial Sacrum: Increase protein intake. Home Health: Wound #1 Medial Sacrum: Continue Home Health Visits - [facility] Nurse may visit PRN to address patient s wound care needs. FACE TO FACE ENCOUNTER: MEDICARE and MEDICAID PATIENTS: I certify that this patient is under my care and that I had a face-to-face encounter that meets the physician face-to-face encounter requirements with this patient on this date. The encounter with the patient was in whole or in part for the following MEDICAL CONDITION: (primary reason for Home Healthcare) MEDICAL NECESSITY: I certify, that based on my findings, NURSING services are a medically necessary home health service. HOME BOUND STATUS: I certify that my clinical findings support that this patient is homebound (i.e., Due to illness or injury, pt requires aid of supportive devices such as crutches, cane, wheelchairs, walkers, the use of special transportation or the assistance of another person to leave their place of residence. There is a normal inability to leave the home and doing so requires considerable and taxing effort. Other absences are for medical reasons / religious services and are infrequent or of short duration when for other reasons). If current dressing causes regression in wound condition, may D/C ordered dressing product/s and apply Normal Saline Moist Dressing daily until next Wound Healing Center / Other MD appointment. Notify Wound Healing Center of regression in wound condition at [phone]. Please direct any NON-WOUND related issues/requests for orders to patient's Primary Care Physician Negative Pressure Wound Therapy: Jessica BradyOVERMAN,  Johan W. ([id number]) Wound #1 Medial Sacrum: Discontinue NPWT. My suggestion currently is gonna be that we go ahead and continue with the above wound care measures for the next week.  If anything changes or worsens in the meantime she will contact the office and let us know. For at least her son Chrissie Noa. He anything else changes in the meantime before we see her back. Otherwise my hope is that she will continue to do very well with the new orders with the Jessica [name] dressing's and that this will improve each time we see her week by week. Please see above for specific wound care orders. We will see patient for re-evaluation in 2 week(s) here in the clinic. If anything worsens or changes patient will contact our office for additional recommendations. Electronic Signature(s) Signed: 06/10/2018 5:19:05 PM By: Jessica Kelp PA-C Entered By: Jessica [name] on 06/10/2018 15:18:00 Jessica [name] ([id number]) -------------------------------------------------------------------------------- ROS/PFSH Details [name]. Date of Service: 06/10/2018 11:00 AM Medical Record Number: [id number] Patient Account Number: [id number] Date of Birth/Sex: 06-11-1945 (73 y.o. F) Treating RN: Jessica [name] Primary Care Provider: Annita [name] Other Clinician: Referring Provider: Annita [name] Treating Provider/Extender: Jessica [name], Jessica [name] in Treatment: 54 Information Obtained From Patient Constitutional Symptoms (General Health) Complaints and Symptoms: Negative for: Fever; Chills Respiratory Complaints and Symptoms: No Complaints or Symptoms Cardiovascular Complaints and Symptoms: No Complaints or Symptoms Psychiatric Complaints and Symptoms: No Complaints or Symptoms Immunizations Pneumococcal Vaccine: Received Pneumococcal Vaccination: No Implantable Devices No devices added Family and Social History Cancer: No; Diabetes: No; Heart Disease: No; Never smoker;  Marital Status - Married Physician Affirmation I have reviewed and agree with the above information. Electronic Signature(s) Signed: 06/10/2018 5:19:05 PM By: Jessica Kelp PA-C Signed: 06/13/2018 4:00:49 PM By: Elliot Gurney, BSN, RN, CWS, Kim RN, BSN Entered By: Jessica [name] on 06/10/2018 15:16:46 Jessica [name] ([id number]) -------------------------------------------------------------------------------- SuperBill Details [name]. Date of Service: 06/10/2018 Medical Record Number: [id number] Patient Account Number: [id number] Date of Birth/Sex: 10-Sep-1945 (72 y.o. F) Treating RN: Jessica [name] Primary Care Provider: Annita [name] Other Clinician: Referring Provider: Annita [name] Treating Provider/Extender: Jessica [name], Jessica [name] in Treatment: 54 Diagnosis Coding ICD-10 Codes Code Description L89.154 Pressure ulcer of sacral region, stage 4 M48.00 Spinal stenosis, site unspecified R54 Age-related physical debility G89.4 Chronic pain syndrome E78.2 Mixed hyperlipidemia L89.93 Pressure ulcer of unspecified site, stage 3 Facility Procedures CPT4 Code: [id number] Description: 99213 - WOUND CARE VISIT-LEV 3 EST PT Modifier: Quantity: 1 Physician Procedures CPT4 Code: [id number] Description: 99214 - WC PHYS LEVEL 4 - EST PT ICD-10 Diagnosis Description L89.154 Pressure ulcer of sacral region, stage 4 M48.00 Spinal stenosis, site unspecified R54 Age-related physical debility G89.4 Chronic pain syndrome Modifier: Quantity: 1 Electronic Signature(s) Signed: 06/10/2018 5:19:05 PM By: Jessica Kelp PA-C Entered By: Jessica [name] on 06/10/2018 15:18:11

## 2018-06-14 NOTE — Progress Notes (Signed)
SKARLET, TROCHEZ ([id number]) Visit Report for 06/10/2018 Arrival Information Details [name], Jessica [name]. Date of Service: 06/10/2018 11:00 AM Medical Record Number: [id number] Patient Account Number: [id number] Date of Birth/Sex: 07-29-1945 (73 y.o. F) Treating RN: Huel Coventry Primary Care Cambell Stanek: Annita Brod Other Clinician: Referring Cason Dabney: Annita Brod Treating Virgel Haro/Extender: Linwood Dibbles, HOYT Weeks in Treatment: 54 Visit Information History Since Last Visit Added or deleted any medications: No Patient Arrived: Stretcher Any new allergies or adverse reactions: No Arrival Time: 11:00 Had a fall or experienced change in No Accompanied By: EMS and son activities of daily living that may affect Transfer Assistance: Manual risk of falls: Patient Identification Verified: Yes Signs or symptoms of abuse/neglect since last visito No Secondary Verification Process Yes Hospitalized since last visit: No Completed: Implantable device outside of the clinic excluding No Patient Has Alerts: Yes cellular tissue based products placed in the center Patient Alerts: ALLERGIC TO since last visit: ZINC Has Dressing in Place as Prescribed: Yes Pain Present Now: No Electronic Signature(s) Signed: 06/10/2018 3:07:51 PM By: Dayton Martes RCP, RRT, CHT Entered By: Dayton Martes on 06/10/2018 11:01:32 Jessica [name] ([id number]) -------------------------------------------------------------------------------- Clinic Level of Care Assessment Details [name]. Date of Service: 06/10/2018 11:00 AM Medical Record Number: [id number] Patient Account Number: [id number] Date of Birth/Sex: 10-15-1945 (73 y.o. F) Treating RN: Huel Coventry Primary Care Carilyn Woolston: Annita Brod Other Clinician: Referring Deshayla Empson: Annita Brod Treating Tashanti Dalporto/Extender: Linwood Dibbles, HOYT Weeks in Treatment: 54 Clinic Level of Care Assessment  Items TOOL 4 Quantity Score []  - Use when only an EandM is performed on FOLLOW-UP visit 0 ASSESSMENTS - Nursing Assessment / Reassessment []  - Reassessment of Co-morbidities (includes updates in patient status) 0 X- 1 5 Reassessment of Adherence to Treatment Plan ASSESSMENTS - Wound and Skin Assessment / Reassessment X - Simple Wound Assessment / Reassessment - one wound 1 5 []  - 0 Complex Wound Assessment / Reassessment - multiple wounds []  - 0 Dermatologic / Skin Assessment (not related to wound area) ASSESSMENTS - Focused Assessment []  - Circumferential Edema Measurements - multi extremities 0 []  - 0 Nutritional Assessment / Counseling / Intervention []  - 0 Lower Extremity Assessment (monofilament, tuning fork, pulses) []  - 0 Peripheral Arterial Disease Assessment (using hand held doppler) ASSESSMENTS - Ostomy and/or Continence Assessment and Care []  - Incontinence Assessment and Management 0 []  - 0 Ostomy Care Assessment and Management (repouching, etc.) PROCESS - Coordination of Care X - Simple Patient / Family Education for ongoing care 1 15 []  - 0 Complex (extensive) Patient / Family Education for ongoing care X- 1 10 Staff obtains Chiropractor, Records, Test Results / Process Orders []  - 0 Staff telephones HHA, Nursing Homes / Clarify orders / etc []  - 0 Routine Transfer to another Facility (non-emergent condition) []  - 0 Routine Hospital Admission (non-emergent condition) []  - 0 New Admissions / Manufacturing engineer / Ordering NPWT, Apligraf, etc. []  - 0 Emergency Hospital Admission (emergent condition) X- 1 10 Simple Discharge Coordination ASHMI, AYENI. ([id number]) []  - 0 Complex (extensive) Discharge Coordination PROCESS - Special Needs []  - Pediatric / Minor Patient Management 0 []  - 0 Isolation Patient Management []  - 0 Hearing / Language / Visual special needs []  - 0 Assessment of Community assistance (transportation, D/C planning, etc.) []  -  0 Additional assistance / Altered mentation []  - 0 Support Surface(s) Assessment (bed, cushion, seat, etc.) INTERVENTIONS - Wound Cleansing / Measurement X - Simple Wound Cleansing - one wound 1  5 []  - 0 Complex Wound Cleansing - multiple wounds X- 1 5 Wound Imaging (photographs - any number of wounds) []  - 0 Wound Tracing (instead of photographs) X- 1 5 Simple Wound Measurement - one wound []  - 0 Complex Wound Measurement - multiple wounds INTERVENTIONS - Wound Dressings []  - Small Wound Dressing one or multiple wounds 0 X- 1 15 Medium Wound Dressing one or multiple wounds []  - 0 Large Wound Dressing one or multiple wounds []  - 0 Application of Medications - topical []  - 0 Application of Medications - injection INTERVENTIONS - Miscellaneous []  - External ear exam 0 []  - 0 Specimen Collection (cultures, biopsies, blood, body fluids, etc.) []  - 0 Specimen(s) / Culture(s) sent or taken to Lab for analysis []  - 0 Patient Transfer (multiple staff / Nurse, adultHoyer Lift / Similar devices) []  - 0 Simple Staple / Suture removal (25 or less) []  - 0 Complex Staple / Suture removal (26 or more) []  - 0 Hypo / Hyperglycemic Management (close monitor of Blood Glucose) []  - 0 Ankle / Brachial Index (ABI) - do not check if billed separately X- 1 5 Vital Signs Schneck, Arnetra W. ([id number]) Has the patient been seen at the hospital within the last three years: Yes Total Score: 80 Level Of Care: New/Established - Level 3 Electronic Signature(s) Signed: 06/13/2018 4:00:49 PM By: Elliot GurneyWoody, BSN, RN, CWS, Kim RN, BSN Entered By: Elliot GurneyWoody, BSN, RN, CWS, Kim on 06/10/2018 12:08:20 Jessica BradyVERMAN, Oliviagrace W. ([id number]) -------------------------------------------------------------------------------- Encounter Discharge Information Details Patient Name: Jessica BradyVERMAN, Hena W. Date of Service: 06/10/2018 11:00 AM Medical Record Number: [id number] Patient Account Number: [id number] Date of Birth/Sex: 28-Oct-1945 35(73  y.o. F) Treating RN: Huel CoventryWoody, Kim Primary Care Mirriam Vadala: Annita BrodASENSO, PHILIP Other Clinician: Referring Anushree Dorsi: Annita BrodASENSO, PHILIP Treating Lason Eveland/Extender: Linwood DibblesSTONE III, HOYT Weeks in Treatment: 5054 Encounter Discharge Information Items Discharge Condition: Stable Ambulatory Status: Stretcher Discharge Destination: Home Transportation: Ambulance Accompanied By: son Schedule Follow-up Appointment: Yes Clinical Summary of Care: Electronic Signature(s) Signed: 06/13/2018 4:00:49 PM By: Elliot GurneyWoody, BSN, RN, CWS, Kim RN, BSN Entered By: Elliot GurneyWoody, BSN, RN, CWS, Kim on 06/10/2018 12:13:36 Jessica BradyVERMAN, Felina W. ([id number]) -------------------------------------------------------------------------------- Lower Extremity Assessment Details Patient Name: Jessica BradyVERMAN, Acire W. Date of Service: 06/10/2018 11:00 AM Medical Record Number: [id number] Patient Account Number: [id number] Date of Birth/Sex: 28-Oct-1945 [age] y.o. F) Treating RN: Rodell PernaScott, Dajea Primary Care Daja Shuping: Annita BrodASENSO, PHILIP Other Clinician: Referring Brysten Reister: Annita BrodASENSO, PHILIP Treating Sylver Vantassell/Extender: Linwood DibblesSTONE III, HOYT Weeks in Treatment: 54 Electronic Signature(s) Signed: 06/10/2018 12:47:49 PM By: Rodell PernaScott, Dajea Entered By: Rodell PernaScott, Dajea on 06/10/2018 11:09:38 Jessica BradyVERMAN, Liany W. ([id number]) -------------------------------------------------------------------------------- Multi Wound Chart Details Patient Name: Jessica BradyVERMAN, Xiamara W. Date of Service: 06/10/2018 11:00 AM Medical Record Number: [id number] Patient Account Number: [id number] Date of Birth/Sex: 28-Oct-1945 [age] y.o. F) Treating RN: Huel CoventryWoody, Kim Primary Care Sommer Spickard: Annita BrodASENSO, PHILIP Other Clinician: Referring Mizael Sagar: Annita BrodASENSO, PHILIP Treating Yasuo Phimmasone/Extender: STONE III, HOYT Weeks in Treatment: 54 Vital Signs Height(in): 70 Pulse(bpm): 73 Weight(lbs): Blood Pressure(mmHg): 132/58 Body Mass Index(BMI): Temperature(F): 98.3 Respiratory Rate 16 (breaths/min): Photos: [1:No Photos]  [N/A:N/A] Wound Location: [1:Sacrum - Medial] [N/A:N/A] Wounding Event: [1:Pressure Injury] [N/A:N/A] Primary Etiology: [1:Pressure Ulcer] [N/A:N/A] Date Acquired: [1:01/15/2017] [N/A:N/A] Weeks of Treatment: [1:54] [N/A:N/A] Wound Status: [1:Open] [N/A:N/A] Measurements L x W x D [1:7.7x4.7x2.4] [N/A:N/A] (cm) Area (cm) : [1:28.424] [N/A:N/A] Volume (cm) : [1:68.217] [N/A:N/A] % Reduction in Area: [1:22.90%] [N/A:N/A] % Reduction in Volume: [1:-85.10%] [N/A:N/A] Classification: [1:Category/Stage IV] [N/A:N/A] Exudate Amount: [1:Medium] [N/A:N/A] Exudate Type: [1:Sanguinous] [N/A:N/A] Exudate Color: [1:red] [N/A:N/A] Foul Odor After Cleansing: [1:Yes] [N/A:N/A] Odor  Anticipated Due to [1:No] [N/A:N/A] Product Use: Wound Margin: [1:Distinct, outline attached] [N/A:N/A] Granulation Amount: [1:Large (67-100%)] [N/A:N/A] Granulation Quality: [1:Red, Pink, Hyper-granulation] [N/A:N/A] Necrotic Amount: [1:Small (1-33%)] [N/A:N/A] Exposed Structures: [1:Fascia: Yes Fat Layer (Subcutaneous Tissue) Exposed: Yes Muscle: Yes Tendon: No Joint: No Bone: No] [N/A:N/A] Epithelialization: [1:None] [N/A:N/A] Periwound Skin Texture: [1:Induration: Yes Scarring: Yes Excoriation: No Callus: No] [N/A:N/A] Crepitus: No Rash: No Periwound Skin Moisture: Maceration: Yes N/A N/A Dry/Scaly: No Periwound Skin Color: Erythema: Yes N/A N/A Rubor: Yes Atrophie Blanche: No Cyanosis: No Ecchymosis: No Hemosiderin Staining: No Mottled: No Pallor: No Erythema Location: Circumferential N/A N/A Temperature: No Abnormality N/A N/A Tenderness on Palpation: Yes N/A N/A Wound Preparation: Ulcer Cleansing: N/A N/A Rinsed/Irrigated with Saline Topical Anesthetic Applied: Other: lidocaine 4% Treatment Notes Electronic Signature(s) Signed: 06/13/2018 4:00:49 PM By: Elliot Gurney, BSN, RN, CWS, Kim RN, BSN Entered By: Elliot Gurney, BSN, RN, CWS, Kim on 06/10/2018 12:05:52 Jessica [name]  ([id number]) -------------------------------------------------------------------------------- Multi-Disciplinary Care Plan Details Patient Name: ERMAL, LOM. Date of Service: 06/10/2018 11:00 AM Medical Record Number: [id number] Patient Account Number: [id number] Date of Birth/Sex: 10/12/1945 (73 y.o. F) Treating RN: Huel Coventry Primary Care Itzel Lowrimore: Annita Brod Other Clinician: Referring Windi Toro: Annita Brod Treating Lizania Bouchard/Extender: Linwood Dibbles, HOYT Weeks in Treatment: 94 Active Inactive Orientation to the Wound Care Program Nursing Diagnoses: Knowledge deficit related to the wound healing center program Goals: Patient/caregiver will verbalize understanding of the Wound Healing Center Program Date Initiated: 06/03/2017 Target Resolution Date: 06/17/2017 Goal Status: Active Interventions: Provide education on orientation to the wound center Notes: Pressure Nursing Diagnoses: Knowledge deficit related to causes and risk factors for pressure ulcer development Knowledge deficit related to management of pressures ulcers Potential for impaired tissue integrity related to pressure, friction, moisture, and shear Goals: Patient will remain free from development of additional pressure ulcers Date Initiated: 06/03/2017 Target Resolution Date: 06/17/2017 Goal Status: Active Patient will remain free of pressure ulcers Date Initiated: 06/03/2017 Target Resolution Date: 06/17/2017 Goal Status: Active Patient/caregiver will verbalize risk factors for pressure ulcer development Date Initiated: 06/03/2017 Target Resolution Date: 06/17/2017 Goal Status: Active Interventions: Assess: immobility, friction, shearing, incontinence upon admission and as needed Assess potential for pressure ulcer upon admission and as needed Notes: Wound/Skin Impairment OTTILIA, WINAND ([id number]) Nursing Diagnoses: Impaired tissue integrity Goals: Patient/caregiver will verbalize understanding of skin  care regimen Date Initiated: 06/03/2017 Target Resolution Date: 06/17/2017 Goal Status: Active Ulcer/skin breakdown will have a volume reduction of 30% by week 4 Date Initiated: 06/03/2017 Target Resolution Date: 06/17/2017 Goal Status: Active Interventions: Assess ulceration(s) every visit Treatment Activities: Patient referred to home care : 06/03/2017 Skin care regimen initiated : 06/03/2017 Notes: Electronic Signature(s) Signed: 06/13/2018 4:00:49 PM By: Elliot Gurney, BSN, RN, CWS, Kim RN, BSN Entered By: Elliot Gurney, BSN, RN, CWS, Kim on 06/10/2018 12:05:45 Jessica [name] ([id number]) -------------------------------------------------------------------------------- Pain Assessment Details [name]. Date of Service: 06/10/2018 11:00 AM Medical Record Number: [id number] Patient Account Number: [id number] Date of Birth/Sex: 02-07-1946 (73 y.o. F) Treating RN: Huel Coventry Primary Care Deardra Hinkley: Annita Brod Other Clinician: Referring Tiahna Cure: Annita Brod Treating Haidyn Chadderdon/Extender: Linwood Dibbles, HOYT Weeks in Treatment: 54 Active Problems Location of Pain Severity and Description of Pain Patient Has Paino No Site Locations Pain Management and Medication Current Pain Management: Electronic Signature(s) Signed: 06/10/2018 3:07:51 PM By: Dayton Martes RCP, RRT, CHT Signed: 06/13/2018 4:00:49 PM By: Elliot Gurney, BSN, RN, CWS, Kim RN, BSN Entered By: Dayton Martes on 06/10/2018 11:01:39 Jessica [name] ([id number]) -------------------------------------------------------------------------------- Patient/Caregiver Education Details  Patient Name: ANALISIA, KINGSFORD. Date of Service: 06/10/2018 11:00 AM Medical Record Number: [id number] Patient Account Number: [id number] Date of Birth/Gender: June 17, 1945 (73 y.o. F) Treating RN: Huel Coventry Primary Care Physician: Annita Brod Other Clinician: Referring Physician: Annita Brod Treating  Physician/Extender: Skeet Simmer in Treatment: 10 Education Assessment Education Provided To: Patient Education Topics Provided Wound/Skin Impairment: Handouts: Caring for Your Ulcer, Other: continue wound care as prescribed Methods: Demonstration, Explain/Verbal Responses: State content correctly Electronic Signature(s) Signed: 06/13/2018 4:00:49 PM By: Elliot Gurney, BSN, RN, CWS, Kim RN, BSN Entered By: Elliot Gurney, BSN, RN, CWS, Kim on 06/10/2018 12:08:50 Jessica [name] ([id number]) -------------------------------------------------------------------------------- Wound Assessment Details [name]. Date of Service: 06/10/2018 11:00 AM Medical Record Number: [id number] Patient Account Number: [id number] Date of Birth/Sex: 1946-03-23 (73 y.o. F) Treating RN: Rodell Perna Primary Care Ambrie Carte: Annita Brod Other Clinician: Referring Joshoa Shawler: Annita Brod Treating Kym Scannell/Extender: STONE III, HOYT Weeks in Treatment: 54 Wound Status Wound Number: 1 Primary Etiology: Pressure Ulcer Wound Location: Sacrum - Medial Wound Status: Open Wounding Event: Pressure Injury Date Acquired: 01/15/2017 Weeks Of Treatment: 54 Clustered Wound: No Photos Photo Uploaded By: Rodell Perna on 06/10/2018 12:49:02 Wound Measurements Length: (cm) 7.7 Width: (cm) 4.7 Depth: (cm) 2.4 Area: (cm) 28.424 Volume: (cm) 68.217 % Reduction in Area: 22.9% % Reduction in Volume: -85.1% Epithelialization: None Tunneling: No Undermining: No Wound Description Classification: Category/Stage IV Wound Margin: Distinct, outline attached Exudate Amount: Medium Exudate Type: Sanguinous Exudate Color: red Foul Odor After Cleansing: Yes Due to Product Use: No Slough/Fibrino Yes Wound Bed Granulation Amount: Large (67-100%) Exposed Structure Granulation Quality: Red, Pink, Hyper-granulation Fascia Exposed: Yes Necrotic Amount: Small (1-33%) Fat Layer (Subcutaneous Tissue)  Exposed: Yes Necrotic Quality: Adherent Slough Tendon Exposed: No Muscle Exposed: Yes Necrosis of Muscle: No Joint Exposed: No Bone Exposed: No Periwound Skin Texture BERANIA, PEEDIN. ([id number]) Texture Color No Abnormalities Noted: No No Abnormalities Noted: No Callus: No Atrophie Blanche: No Crepitus: No Cyanosis: No Excoriation: No Ecchymosis: No Induration: Yes Erythema: Yes Rash: No Erythema Location: Circumferential Scarring: Yes Hemosiderin Staining: No Mottled: No Moisture Pallor: No No Abnormalities Noted: No Rubor: Yes Dry / Scaly: No Maceration: Yes Temperature / Pain Temperature: No Abnormality Tenderness on Palpation: Yes Wound Preparation Ulcer Cleansing: Rinsed/Irrigated with Saline Topical Anesthetic Applied: Other: lidocaine 4%, Treatment Notes Wound #1 (Medial Sacrum) Notes HHRN to replace wound vac once gauze is available for use under NPWT, Vashe soaked gauze with ABD pad on top secured with tape Electronic Signature(s) Signed: 06/10/2018 12:47:49 PM By: Rodell Perna Entered By: Rodell Perna on 06/10/2018 11:09:28 Jessica [name] ([id number]) -------------------------------------------------------------------------------- Vitals Details [name]. Date of Service: 06/10/2018 11:00 AM Medical Record Number: [id number] Patient Account Number: [id number] Date of Birth/Sex: [birth year] (73 y.o. F) Treating RN: Huel Coventry Primary Care Ogle Hoeffner: Annita Brod Other Clinician: Referring Anndee Connett: Annita Brod Treating Righteous Claiborne/Extender: STONE III, HOYT Weeks in Treatment: 54 Vital Signs Time Taken: 11:01 Temperature (F): 98.3 Height (in): 70 Pulse (bpm): 73 Respiratory Rate (breaths/min): 16 Blood Pressure (mmHg): 132/58 Reference Range: 80 - 120 mg / dl Electronic Signature(s) Signed: 06/10/2018 3:07:51 PM By: Dayton Martes RCP, RRT, CHT Entered By: Dayton Martes on 06/10/2018  11:04:08

## 2018-06-24 ENCOUNTER — Other Ambulatory Visit: Payer: Self-pay

## 2018-06-24 ENCOUNTER — Encounter: Payer: Medicare Other | Attending: Physician Assistant | Admitting: Physician Assistant

## 2018-06-24 DIAGNOSIS — L89154 Pressure ulcer of sacral region, stage 4: Secondary | ICD-10-CM | POA: Insufficient documentation

## 2018-06-24 DIAGNOSIS — L8993 Pressure ulcer of unspecified site, stage 3: Secondary | ICD-10-CM | POA: Diagnosis not present

## 2018-06-26 NOTE — Progress Notes (Signed)
Jessica [name], [name] ([id number]) Visit Report for 06/24/2018 Arrival Information Details [name], [name] Date of Service: 06/24/2018 11:00 AM Medical Record Number: [id number] Patient Account Number: [id number] Date of Birth/Sex: 1946/04/10 9(73 y.o. F) Treating RN: Rodell PernaScott, Dajea Primary Care Aelyn Stanaland: Annita BrodASENSO, PHILIP Other Clinician: Referring Audrionna Lampton: Annita BrodASENSO, PHILIP Treating Alaijah Gibler/Extender: Linwood DibblesSTONE III, HOYT Weeks in Treatment: 56 Visit Information History Since Last Visit Added or deleted any medications: No Patient Arrived: Stretcher Any new allergies or adverse reactions: No Arrival Time: 10:38 Had a fall or experienced change in No Accompanied By: son activities of daily living that may affect Transfer Assistance: Transfer Board risk of falls: Patient Has Alerts: Yes Signs or symptoms of abuse/neglect since last visito No Patient Alerts: ALLERGIC TO ZINC Hospitalized since last visit: No Implantable device outside of the clinic excluding No cellular tissue based products placed in the center since last visit: Has Dressing in Place as Prescribed: Yes Pain Present Now: No Electronic Signature(s) Signed: 06/24/2018 3:17:15 PM By: Rodell PernaScott, Dajea Entered By: Rodell PernaScott, Dajea on 06/24/2018 10:39:24 Jessica [name], [name] ([id number]) -------------------------------------------------------------------------------- Clinic Level of Care Assessment Details [name], [name] Date of Service: 06/24/2018 11:00 AM Medical Record Number: [id number] Patient Account Number: [id number] Date of Birth/Sex: 1946/04/10 81(73 y.o. F) Treating RN: Curtis Sitesorthy, Joanna Primary Care Jaysten Essner: Annita BrodASENSO, PHILIP Other Clinician: Referring Jaksen Fiorella: Annita BrodASENSO, PHILIP Treating Berry Godsey/Extender: STONE III, HOYT Weeks in Treatment: 56 Clinic Level of Care Assessment Items TOOL 4 Quantity Score []  - Use when only an EandM is performed on FOLLOW-UP visit 0 ASSESSMENTS - Nursing Assessment /  Reassessment X - Reassessment of Co-morbidities (includes updates in patient status) 1 10 X- 1 5 Reassessment of Adherence to Treatment Plan ASSESSMENTS - Wound and Skin Assessment / Reassessment X - Simple Wound Assessment / Reassessment - one wound 1 5 []  - 0 Complex Wound Assessment / Reassessment - multiple wounds []  - 0 Dermatologic / Skin Assessment (not related to wound area) ASSESSMENTS - Focused Assessment []  - Circumferential Edema Measurements - multi extremities 0 []  - 0 Nutritional Assessment / Counseling / Intervention []  - 0 Lower Extremity Assessment (monofilament, tuning fork, pulses) []  - 0 Peripheral Arterial Disease Assessment (using hand held doppler) ASSESSMENTS - Ostomy and/or Continence Assessment and Care []  - Incontinence Assessment and Management 0 []  - 0 Ostomy Care Assessment and Management (repouching, etc.) PROCESS - Coordination of Care X - Simple Patient / Family Education for ongoing care 1 15 []  - 0 Complex (extensive) Patient / Family Education for ongoing care X- 1 10 Staff obtains ChiropractorConsents, Records, Test Results / Process Orders []  - 0 Staff telephones HHA, Nursing Homes / Clarify orders / etc []  - 0 Routine Transfer to another Facility (non-emergent condition) []  - 0 Routine Hospital Admission (non-emergent condition) []  - 0 New Admissions / Manufacturing engineernsurance Authorizations / Ordering NPWT, Apligraf, etc. []  - 0 Emergency Hospital Admission (emergent condition) X- 1 10 Simple Discharge Coordination Jessica [name], [name] ([id number]) []  - 0 Complex (extensive) Discharge Coordination PROCESS - Special Needs []  - Pediatric / Minor Patient Management 0 []  - 0 Isolation Patient Management []  - 0 Hearing / Language / Visual special needs []  - 0 Assessment of Community assistance (transportation, D/C planning, etc.) []  - 0 Additional assistance / Altered mentation []  - 0 Support Surface(s) Assessment (bed, cushion, seat, etc.) INTERVENTIONS  - Wound Cleansing / Measurement X - Simple Wound Cleansing - one wound 1 5 []  - 0 Complex Wound Cleansing - multiple wounds X- 1 5 Wound  Imaging (photographs - any number of wounds) []  - 0 Wound Tracing (instead of photographs) X- 1 5 Simple Wound Measurement - one wound []  - 0 Complex Wound Measurement - multiple wounds INTERVENTIONS - Wound Dressings X - Small Wound Dressing one or multiple wounds 1 10 []  - 0 Medium Wound Dressing one or multiple wounds []  - 0 Large Wound Dressing one or multiple wounds X- 1 5 Application of Medications - topical []  - 0 Application of Medications - injection INTERVENTIONS - Miscellaneous []  - External ear exam 0 []  - 0 Specimen Collection (cultures, biopsies, blood, body fluids, etc.) []  - 0 Specimen(s) / Culture(s) sent or taken to Lab for analysis []  - 0 Patient Transfer (multiple staff / Nurse, adult / Similar devices) []  - 0 Simple Staple / Suture removal (25 or less) []  - 0 Complex Staple / Suture removal (26 or more) []  - 0 Hypo / Hyperglycemic Management (close monitor of Blood Glucose) []  - 0 Ankle / Brachial Index (ABI) - do not check if billed separately X- 1 5 Vital Signs Brede, Svea W. ([id number]) Has the patient been seen at the hospital within the last three years: Yes Total Score: 90 Level Of Care: New/Established - Level 3 Electronic Signature(s) Signed: 06/24/2018 5:05:27 PM By: Curtis Sites Entered By: Curtis Sites on 06/24/2018 11:10:23 Jessica Brady ([id number]) -------------------------------------------------------------------------------- Encounter Discharge Information Details Patient Name: Jessica Brady. Date of Service: 06/24/2018 11:00 AM Medical Record Number: [id number] Patient Account Number: [id number] Date of Birth/Sex: Jun 25, 1945 (73 y.o. F) Treating RN: Huel Coventry Primary Care Murrel Bertram: Annita Brod Other Clinician: Referring Demon Volante: Annita Brod Treating  Mckenna Boruff/Extender: Linwood Dibbles, HOYT Weeks in Treatment: 16 Encounter Discharge Information Items Discharge Condition: Stable Ambulatory Status: Ambulatory Discharge Destination: Home Transportation: Private Auto Accompanied By: self Schedule Follow-up Appointment: Yes Clinical Summary of Care: Electronic Signature(s) Signed: 06/24/2018 3:32:19 PM By: Elliot Gurney, BSN, RN, CWS, Kim RN, BSN Entered By: Elliot Gurney, BSN, RN, CWS, Kim on 06/24/2018 15:32:19 Jessica Brady ([id number]) -------------------------------------------------------------------------------- Lower Extremity Assessment Details Patient Name: Jessica Brady. Date of Service: 06/24/2018 11:00 AM Medical Record Number: [id number] Patient Account Number: [id number] Date of Birth/Sex: January 17, 1946 (73 y.o. F) Treating RN: Rodell Perna Primary Care Anajah Sterbenz: Annita Brod Other Clinician: Referring Destany Severns: Annita Brod Treating Fabianna Keats/Extender: Linwood Dibbles, HOYT Weeks in Treatment: 56 Electronic Signature(s) Signed: 06/24/2018 3:17:15 PM By: Rodell Perna Entered By: Rodell Perna on 06/24/2018 10:45:31 Jessica Brady ([id number]) -------------------------------------------------------------------------------- Multi Wound Chart Details Patient Name: Jessica Brady. Date of Service: 06/24/2018 11:00 AM Medical Record Number: [id number] Patient Account Number: [id number] Date of Birth/Sex: Sep 07, 1945 (73 y.o. F) Treating RN: Curtis Sites Primary Care Ashland Wiseman: Annita Brod Other Clinician: Referring Doreatha Offer: Annita Brod Treating Coden Franchi/Extender: STONE III, HOYT Weeks in Treatment: 56 Vital Signs Height(in): 70 Pulse(bpm): 71 Weight(lbs): Blood Pressure(mmHg): 154/63 Body Mass Index(BMI): Temperature(F): 98.2 Respiratory Rate 16 (breaths/min): Photos: [1:No Photos] [N/A:N/A] Wound Location: [1:Sacrum - Medial] [N/A:N/A] Wounding Event: [1:Pressure Injury] [N/A:N/A] Primary Etiology: [1:Pressure  Ulcer] [N/A:N/A] Date Acquired: [1:01/15/2017] [N/A:N/A] Weeks of Treatment: [1:56] [N/A:N/A] Wound Status: [1:Open] [N/A:N/A] Measurements L x W x D [1:7.9x4.5x3] [N/A:N/A] (cm) Area (cm) : [1:27.921] [N/A:N/A] Volume (cm) : [1:83.763] [N/A:N/A] % Reduction in Area: [1:24.20%] [N/A:N/A] % Reduction in Volume: [1:-127.30%] [N/A:N/A] Classification: [1:Category/Stage IV] [N/A:N/A] Exudate Amount: [1:Medium] [N/A:N/A] Exudate Type: [1:Sanguinous] [N/A:N/A] Exudate Color: [1:red] [N/A:N/A] Foul Odor After Cleansing: [1:Yes] [N/A:N/A] Odor Anticipated Due to [1:No] [N/A:N/A] Product Use: Wound Margin: [1:Distinct, outline attached] [N/A:N/A] Granulation Amount: [1:Large (67-100%)] [N/A:N/A] Granulation Quality: [  1:Red, Pink, Hyper-granulation] [N/A:N/A] Necrotic Amount: [1:Small (1-33%)] [N/A:N/A] Exposed Structures: [1:Fascia: Yes Fat Layer (Subcutaneous Tissue) Exposed: Yes Muscle: Yes Tendon: No Joint: No Bone: No] [N/A:N/A] Epithelialization: [1:None] [N/A:N/A] Periwound Skin Texture: [1:Induration: Yes Scarring: Yes Excoriation: No Callus: No] [N/A:N/A] Crepitus: No Rash: No Periwound Skin Moisture: Maceration: Yes N/A N/A Dry/Scaly: No Periwound Skin Color: Erythema: Yes N/A N/A Rubor: Yes Atrophie Blanche: No Cyanosis: No Ecchymosis: No Hemosiderin Staining: No Mottled: No Pallor: No Erythema Location: Circumferential N/A N/A Temperature: No Abnormality N/A N/A Tenderness on Palpation: Yes N/A N/A Wound Preparation: Ulcer Cleansing: N/A N/A Rinsed/Irrigated with Saline Topical Anesthetic Applied: Other: lidocaine 4% Treatment Notes Electronic Signature(s) Signed: 06/24/2018 5:05:27 PM By: Curtis Sites Entered By: Curtis Sites on 06/24/2018 11:03:29 Jessica Brady ([id number]) -------------------------------------------------------------------------------- Multi-Disciplinary Care Plan Details Patient Name: Jessica Brady. Date of Service:  06/24/2018 11:00 AM Medical Record Number: [id number] Patient Account Number: [id number] Date of Birth/Sex: 02-Jul-1945 (73 y.o. F) Treating RN: Curtis Sites Primary Care Hero Kulish: Annita Brod Other Clinician: Referring Champ Keetch: Annita Brod Treating Sirius Woodford/Extender: STONE III, HOYT Weeks in Treatment: 34 Active Inactive Orientation to the Wound Care Program Nursing Diagnoses: Knowledge deficit related to the wound healing center program Goals: Patient/caregiver will verbalize understanding of the Wound Healing Center Program Date Initiated: 06/03/2017 Target Resolution Date: 06/17/2017 Goal Status: Active Interventions: Provide education on orientation to the wound center Notes: Pressure Nursing Diagnoses: Knowledge deficit related to causes and risk factors for pressure ulcer development Knowledge deficit related to management of pressures ulcers Potential for impaired tissue integrity related to pressure, friction, moisture, and shear Goals: Patient will remain free from development of additional pressure ulcers Date Initiated: 06/03/2017 Target Resolution Date: 06/17/2017 Goal Status: Active Patient will remain free of pressure ulcers Date Initiated: 06/03/2017 Target Resolution Date: 06/17/2017 Goal Status: Active Patient/caregiver will verbalize risk factors for pressure ulcer development Date Initiated: 06/03/2017 Target Resolution Date: 06/17/2017 Goal Status: Active Interventions: Assess: immobility, friction, shearing, incontinence upon admission and as needed Assess potential for pressure ulcer upon admission and as needed Notes: Wound/Skin Impairment ALILYANA, TOLMAN ([id number]) Nursing Diagnoses: Impaired tissue integrity Goals: Patient/caregiver will verbalize understanding of skin care regimen Date Initiated: 06/03/2017 Target Resolution Date: 06/17/2017 Goal Status: Active Ulcer/skin breakdown will have a volume reduction of 30% by week 4 Date Initiated:  06/03/2017 Target Resolution Date: 06/17/2017 Goal Status: Active Interventions: Assess ulceration(s) every visit Treatment Activities: Patient referred to home care : 06/03/2017 Skin care regimen initiated : 06/03/2017 Notes: Electronic Signature(s) Signed: 06/24/2018 5:05:27 PM By: Curtis Sites Entered By: Curtis Sites on 06/24/2018 11:03:21 Jessica Brady ([id number]) -------------------------------------------------------------------------------- Pain Assessment Details Patient Name: Jessica Brady. Date of Service: 06/24/2018 11:00 AM Medical Record Number: [id number] Patient Account Number: [id number] Date of Birth/Sex: 1946-02-16 (73 y.o. F) Treating RN: Rodell Perna Primary Care Manya Balash: Annita Brod Other Clinician: Referring Fateh Kindle: Annita Brod Treating Dreden Rivere/Extender: STONE III, HOYT Weeks in Treatment: 56 Active Problems Location of Pain Severity and Description of Pain Patient Has Paino No Site Locations Pain Management and Medication Current Pain Management: Electronic Signature(s) Signed: 06/24/2018 3:17:15 PM By: Rodell Perna Entered By: Rodell Perna on 06/24/2018 10:39:30 Jessica Brady ([id number]) -------------------------------------------------------------------------------- Patient/Caregiver Education Details Patient Name: Jessica Brady. Date of Service: 06/24/2018 11:00 AM Medical Record Number: [id number] Patient Account Number: [id number] Date of Birth/Gender: Mar 08, 1946 (73 y.o. F) Treating RN: Curtis Sites Primary Care Physician: Annita Brod Other Clinician: Referring Physician: Annita Brod Treating Physician/Extender: Linwood Dibbles, HOYT Weeks in Treatment: 27 Education Assessment Education  Provided To: Patient and Caregiver Education Topics Provided Wound/Skin Impairment: Handouts: Other: wound care as ordered Methods: Demonstration, Explain/Verbal Responses: State content correctly Electronic  Signature(s) Signed: 06/24/2018 5:05:27 PM By: Curtis Sites Entered By: Curtis Sites on 06/24/2018 11:10:48 Jessica Brady ([id number]) -------------------------------------------------------------------------------- Wound Assessment Details Patient Name: Jessica Brady. Date of Service: 06/24/2018 11:00 AM Medical Record Number: [id number] Patient Account Number: [id number] Date of Birth/Sex: 18-Apr-1945 (73 y.o. F) Treating RN: Rodell Perna Primary Care Rip Hawes: Annita Brod Other Clinician: Referring Tally Mattox: Annita Brod Treating Maimouna Rondeau/Extender: STONE III, HOYT Weeks in Treatment: 56 Wound Status Wound Number: 1 Primary Etiology: Pressure Ulcer Wound Location: Sacrum - Medial Wound Status: Open Wounding Event: Pressure Injury Date Acquired: 01/15/2017 Weeks Of Treatment: 56 Clustered Wound: No Photos Photo Uploaded By: Rodell Perna on 06/24/2018 13:33:05 Wound Measurements Length: (cm) 7.9 Width: (cm) 4.5 Depth: (cm) 3 Area: (cm) 27.921 Volume: (cm) 83.763 % Reduction in Area: 24.2% % Reduction in Volume: -127.3% Epithelialization: None Tunneling: No Undermining: No Wound Description Classification: Category/Stage IV Wound Margin: Distinct, outline attached Exudate Amount: Medium Exudate Type: Sanguinous Exudate Color: red Foul Odor After Cleansing: Yes Due to Product Use: No Slough/Fibrino Yes Wound Bed Granulation Amount: Large (67-100%) Exposed Structure Granulation Quality: Red, Pink, Hyper-granulation Fascia Exposed: Yes Necrotic Amount: Small (1-33%) Fat Layer (Subcutaneous Tissue) Exposed: Yes Necrotic Quality: Adherent Slough Tendon Exposed: No Muscle Exposed: Yes Necrosis of Muscle: No Joint Exposed: No Bone Exposed: No Periwound Skin Texture DAVE, MANNES. ([id number]) Texture Color No Abnormalities Noted: No No Abnormalities Noted: No Callus: No Atrophie Blanche: No Crepitus: No Cyanosis: No Excoriation:  No Ecchymosis: No Induration: Yes Erythema: Yes Rash: No Erythema Location: Circumferential Scarring: Yes Hemosiderin Staining: No Mottled: No Moisture Pallor: No No Abnormalities Noted: No Rubor: Yes Dry / Scaly: No Maceration: Yes Temperature / Pain Temperature: No Abnormality Tenderness on Palpation: Yes Wound Preparation Ulcer Cleansing: Rinsed/Irrigated with Saline Topical Anesthetic Applied: Other: lidocaine 4%, Treatment Notes Wound #1 (Medial Sacrum) Notes Dakins soaked gauze packed into wound, ABD secured with tape Electronic Signature(s) Signed: 06/24/2018 3:17:15 PM By: Rodell Perna Entered By: Rodell Perna on 06/24/2018 10:45:21 Jessica Brady ([id number]) -------------------------------------------------------------------------------- Vitals Details Patient Name: Jessica Brady. Date of Service: 06/24/2018 11:00 AM Medical Record Number: [id number] Patient Account Number: [id number] Date of Birth/Sex: [birth year] (73 y.o. F) Treating RN: Rodell Perna Primary Care Ellen Mayol: Annita Brod Other Clinician: Referring Inette Doubrava: Annita Brod Treating Yoshiko Keleher/Extender: STONE III, HOYT Weeks in Treatment: 56 Vital Signs Time Taken: 10:38 Temperature (F): 98.2 Height (in): 70 Pulse (bpm): 71 Respiratory Rate (breaths/min): 16 Blood Pressure (mmHg): 154/63 Reference Range: 80 - 120 mg / dl Electronic Signature(s) Signed: 06/24/2018 3:17:15 PM By: Rodell Perna Entered By: Rodell Perna on 06/24/2018 10:39:50

## 2018-06-26 NOTE — Progress Notes (Signed)
Jessica, [name] ([id number]) Visit Report for 06/24/2018 Chief Complaint Document Details Patient Name: Jessica, [name]. Date of Service: 06/24/2018 11:00 AM Medical Record Number: [id number] Patient Account Number: [id number] Date of Birth/Sex: [birth year] (73 y.o. F) Treating RN: Curtis Sites Primary Care Provider: Annita Brod Other Clinician: Referring Provider: Annita Brod Treating Provider/Extender: Linwood Dibbles, HOYT Weeks in Treatment: 56 Information Obtained from: Patient Chief Complaint she is here for evaluation of a sacral ulcer and bilateral lower extremity ulcers Electronic Signature(s) Signed: 06/24/2018 5:40:27 PM By: Lenda Kelp PA-C Entered By: Lenda Kelp on 06/24/2018 10:43:40 Jessica [name] ([id number]) -------------------------------------------------------------------------------- HPI Details [name]. Date of Service: 06/24/2018 11:00 AM Medical Record Number: [id number] Patient Account Number: [id number] Date of Birth/Sex: 02-25-1946 (73 y.o. F) Treating RN: Curtis Sites Primary Care Provider: Annita Brod Other Clinician: Referring Provider: Annita Brod Treating Provider/Extender: Linwood Dibbles, HOYT Weeks in Treatment: 56 History of Present Illness HPI Description: 05/27/17-she is here in initial evaluation for a left-sided sacral stage IV pressure ulcer and bilateral lower extremity, lateral aspect, unstageable pressure ulcers. She is accompanied by her husband and her son, who are her primary caregivers. She is bedbound secondary to spinal stenosis. According to her son and husband she was hospitalized from 10/5-11/1 with healthcare associated pneumonia and altered mental status. During her hospitalization she was intubated, extubated on 10/27. She was discharged with Foley catheter and follow up with urology. According to her son and spouse she developed the sacral ulcer during hospitalization. Home health has  been applying Santyl daily. She does have a low air loss mattress and is repositioned every 2-3 hours per her family's report. According to the son and spouse she had an appointment with urology on 12/18 and during that appointment developed discoloration to her bilateral lower extremities which ultimately developed into unstageable pressure ulcers to the lateral aspects of her bilateral lower extremities. There has been no topical treatment applied to these. She continues to have home health. There is no concerns expressed regarding dietary intake, stating she eats 3 meals a day, eating was provided; she is supplemented with boost with protein. 06/03/17-she is here in follow-up evaluation for sacral and bilateral lower extremity ulcers. Plain film x-ray done today reveals no distraction to the sacrum or coccyx, no visible abnormalities. Home health has ordered the negative pressure wound system but it has not been initiated. We will continue with Hydrofera Blue until initiation of negative pressure wound system and continue with Santyl to bilateral lower extremity ulcers. Follow-up next week 06/10/17-she is here in follow-up evaluation for sacral and bilateral lower extremity ulcers. The wound VAC will be available tomorrow per home health. We will initiate wound VAC therapy to the sacral ulcer 3 times weekly (Thursday, Saturday/Sunday, Tuesday). We will continue with Santyl to the lower extremity ulcers. The patient's son is checking into home health therapy over the weekend for [facility] changes, with the understanding that if VAC changes cannot be performed over the weekend he will need to change his mother's appointments to Monday, Wednesday or Friday. The sacral ulcer clinically does not appear infected but there has been a change in the amount of drainage acutely, there is no significant amount of devitalized tissue, there is no malodor. Wound culture was obtained to evaluate for occult infection we  will hold off on antibiotic therapy until sensitivities are resulted. 06/18/17 on evaluation today patient appears to be doing fairly well in my opinion although this is the first time I  have seen this patient she has been previously evaluated by Tacey RuizLeah here in our office. She is going to be switching to Fridays to see me due to the Wound VAC schedule being changed on Monday, Wednesday, and Friday. Subsequently she seems to be doing fairly well with the Wound VAC. Her son who was present during evaluation today states that he somewhat stresses of the Wound VAC in making sure that it was functioning appropriately. With that being said everything seems to be working well he knows what settings on the [facility] HVAC itself to look at and ensure that it is functioning properly. Overall the wound appears to be nice and clean there is no need for debridement today. She has no discomfort in her bilateral lower extremity ulcers also appear to be improving based on measurements and what this honest tell me about the overall appearance. 07/02/17 on evaluation today I noted in patients wound bed that was actually an odor that had not previously been noted. Subsequently there was also a small area of bone that was not previously noted during my last evaluation. This did appear to be necrotic and was being somewhat forced out by the body around the region of granulation. She does not have any pain which is good news. Nonetheless the overall appearance of the ulcer is making me concerned for the patient having developed osteomyelitis. Currently she is not been on any antibiotics and the Wound VAC has been doing fairly well in general. With that being said I do not think we need to continue the Wound VAC if she potentially has a bone infection. At least not until it is properly addressed with antibiotics. 07/09/17 on evaluation today patient appears to actually be doing very well in regard to her sacral ulcer compared to last  week. There is actually no exposed bone at this point. 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 With that being said still I do believe she needs to have a referral to infectious disease due to the osteomyelitis I am gonna recommend that she continue with the doxycycline based on the culture results which showed Eikenella Corrodens as the Jessica BradyOVERMAN, [name] ([id number]) organism identified that tetracycline should work for. She does not seem to be having any discomfort whatsoever at this point. 07/16/17 on evaluation today patient actually appears to be doing rather well in regard to her sacral wound. I think that the original wound site actually appears much better than previously noted. With that being said she does have a new superficial injury on the right sacral region which appears to be due to according to the sign transport that occurred for her MRI unfortunately. Fortunately this is not too deep and I do think it can be managed but it was a new area that was not previously noted. Otherwise she has been tolerating the Dakinos soaked gauze packing without complication. 07/30/17 on evaluation today patient presents for reevaluation concerning her sacral wound. She has been tolerating the dressing changes without complication. With that being said her wound is doing so well at this point that I think she may be at the point where we could reinitiate the Wound VAC currently and hopefully see good results from this. I'm very pleased with how she has responded to the Dakinos soaked gauze packing. 08/13/17 evaluation today patient appears to be doing excellent in  regard to her lower extremity ulcer this seems to be cleaning up very nicely. In regard to the sacral ulcer the area of trauma on the right lateral portion of the wound actually appears  to be much better than previously noted during the last evaluation. The word has definitely filled in and the Wound VAC appears to be helpful I do believe. Overall I'm very pleased with how things seem to be progressing at this time. Patient likewise is also happy that things are doing well. 08/27/17 on evaluation today patient presents for follow-up concerning her ongoing issues with her sacral ulcer and lower extremity ulcer. Fortunately she has been tolerating the dressing changes well complication the Wound VAC in general seems to have done very well up to this point. She does not have any evidence of infection which is good news. She does have some dark discoloration in central portion of the wound which is troublesome for the possibility of pressure injury to the site it sounds like her prior air mattress was not functioning properly her son has just bought a new one for her this is doing much better. Otherwise things seem to be progressing nicely. 09/10/17 on evaluation today patient presents for follow-up concerning her sacral wound and lower extremity ulcer. She has been tolerating the switch of her dressing changes to the Dakinos soaked gauze dressing very well in regard to the sacrum. The left lateral lower extremity ulcer appears to have a new area open just distal to the one that we have been treating with Santyl and this is new since her last visit. There does not appear to be any evidence of infection which is good news. With that being said there's a lot of maceration here at the site and I feel this is likely due to the switch and the dressings it appears that due to the sticking of the dressings it will switch to utilizing a telfa pad over the Santyl. I think this caused a lot of drainage to be collected and situated in the region just under the dressing which has led to this causing which duration of breakdown. Overall there does not appear to be any significant pain which is good  news. Of note I did actually have a conversation with the radiologist who is an interventional radiologist with [facility]. 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 With that being said there does not appear to be any rush to get this done according to the interventional radiologist whom I spoke with. Therefore after discussion it was determined that there gonna wait about six months prior to considering the procedure to get time for hopefully the sacral wound to heal in the interim to at least some degree. 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 According to nursing staff these are all improved, the negative pressure wound therapy system was put on hold last week. We will resume negative pressure wound therapy today, continue with Santyl to the lower extremity and she will follow-up next week. 09/24/17 on evaluation today unfortunately the patient's wound appears to be doing significantly worse compared to last week's evaluation and even my valuation the week before. She actually has bone noted on the left wound margin in the region of undermining unfortunately. This was not noted during the last evaluation. I am concerned about the fact that this seems to be worse not better since  our last visit with her. My biggest concern is that she's likely developing a worsening osteomyelitis. This was discussed with patient and her son today during the office visit. 05/03/17 on evaluation today patient actually appears to be doing rather well in regard to her sacral ulcer compared to last week's evaluation. She has been tolerating the dressing changes without complication. Fortunately even though we're not utilizing the Wound VAC she seems to be making some strides in seeing the wound area overall improved quite dramatically in my pinion in just one weeks time. She also seems to  be staying off of this to the point that I do not see any evidence of new injury which is excellent news. Whatever she's been doing in that regard over the past week I would want her to continue. I did also review the results of her bone culture which revealed group B strep as the responsible organism. Again this is what's causing her osteomyelitis she does have infectious disease appointment scheduled for 10/11/17 10/08/1898 valuation today patient appears to be doing better for the most part in regard to the sacral wound. Overall she is showing signs of having granulation of the bone which is good news. There is an area towards the left of the wound where she does seem to be showing some signs of opening this it would be due to additional pressure to the area unfortunately. Jessica [name], Jessica [name]. ([id number]) There does not appear to be any evidence of infection spreading which is good news that do not see any evidence of significant purulent discharge which is also good news. 10/26/17 on evaluation today patient sacral ulcer actually appears to be very healthy and doing well in regard to the overall appearance of the wound. With that being said she does seem to be having some issues at this point in time with some shear/friction injury of the sacrum from where she was transported to [facility] for her infectious disease appointment. She has been placed on IV antibiotic therapy I will have to go back and find that note for review as I did not have access to that today. Nonetheless I will add the next visit be sure to document the exact antibiotics that she is taking at this point. Nonetheless I do believe the wound is doing better there's no bone exposure nothing that requires debridement in regard to the sacral wound. Likewise her left lateral lower extremity ulcer also appears to be doing significantly better. In general I feel like things are showing signs of improvement all around. 11/12/17 on  evaluation today patient appears to be doing rather well in regard to her sacral wound. In general I do see signs of granulation at this point. Fortunately there does not appear to be any evidence of infection which is good news as well. In general overall happy with the appearance. With that being said I'll be see I would like for this to be progressing more rapidly as with the patient and her son but nonetheless at least things do not appear to be doing worse. Her lower extremity ulcer is doing significantly better. 11/26/17 on evaluation today patient appears to be doing a little better in regard to the sacral wound especially in the peripheral of the wound bed. She actually did have an abrasion on the right gluteal region that's completely resolved to this point. Her lower extremity also appears to be for the most part completely resolved. Overall the sacrum itself seems to be the one thing that is  still open and given her trouble. No fevers, chills, nausea, or vomiting noted at this time. She actually has an appointment next week with infectious disease for recheck to see how things are doing. She maybe switch to oral medications at that point. 12/09/17 on evaluation today patient actually appears to be doing much better in regard to her sacral wound. I do feel like she has made good progress at this time as far as healing is concerned. In fact the wound looks better to me today than it has in quite some time. She is on oral antibiotic therapy and no longer on the IV antibiotics. She did see infectious disease last week. 12/24/17 on evaluation today patient's wounds actually appear to be doing decently well in regard to the sacral wound in particular. When it comes to her lower extremity ulcers both appear to be completely healed at this point in the one area where she had lived the rash has completely resolved at this time. Overall very pleased with the progress that has been made. Nonetheless  the patient seems to be doing well in general. She still does have quite a bit of healing to do in regard to the sacral wound but I feel like she is making progress. 01/07/18 on evaluation today patient actually appears to be doing excellent today regard to her sacral wound. The granular quality in the base of the wound is excellent and shown signs of good improvement there's no evidence of contusion nor deep tissue injury and the periwound actually appears to be doing well. In general I'm very pleased with the overall appearance. 02/04/18 on evaluation today patient's wound actually appears to be doing decently well as far as the granulation is concerned. She has been tolerating the dressing changes without complication. Right now we are still using the Wound VAC. Nonetheless overall there apparently is some cater to the wound and this is concerning simply for the fact that she could be developing a soft tissue infection again. She's had this happen in the past and at that time responded very well to the antibiotics. Nonetheless I don't think I would likely put her on anything prophylactically but rather wait for the results of the culture to return. 02/18/18 on evaluation today patient actually appears to be doing fairly well. She has been tolerating the dressing changes. And the Wound VAC seems to be doing well. Overall I'm very pleased with how things have improved over the past couple weeks since I last saw her. Fortunately there does not appear to be any evidence of overt infection at this time which is good news. She has been taking the antibiotics without complication. Specifically that is the Bactrim DS which was prescribed on 02/08/18. 03/18/18 on evaluation today patient's wound actually does have quite a bit of odor noted compared to previous. With that being said the wound itself overall does not appear to be too significant or worse. There is no event noted in the wound bed which is good  news. With that being said the patient has been worried as the home health nurse stated that she thought she saw bone in the base of the wound. Nonetheless there's a little bit of bruising along the left lateral border of the wound bed which again does not appear to be terrible we have noticed this before but other than this the majority the wound bed appears to be doing excellent in my pinion. I am wondering if there may be some bacterial colonization. This  could be leading to some of the odor that we are noting with the Wound VAC. Jessica BradyOVERMAN, Dashley W. ([id number]) 04/01/18 on evaluation today patient appears to actually be doing well in regard to the overall appearance of the wound itself. She also does not have any odor at this point in regard to the wound surface and I do believe the Bacons is extremely helpful in this regard. With that being said she does have a little bit of deep tissue injury on the medial aspect of the wound. Again last time it was more lateral. The lateral seems to have improved quite well. Nonetheless she also notes by way of her son that this has been very macerated in the past several weeks since I last saw her. Though things overall seem to be doing better I still think that the Wound VAC may help with managing her fluid better I'm wondering if we can attempt a gauze Wound VAC my hope is that this will be able to manage the moisture control as well as continuing with managing the odor of the wound and bacterial load since the Dakinos has done excellent in helping in that regard. 04/15/17 on evaluation today patient actually appears to be doing much better in regard to the overall appearance of her sacral wound. I do believe that the gauze Vac has been of benefit for her. Overall she is tolerating this without complication which is also good news. There is no sign of injury to the sacral region at this point. 04/29/18 on evaluation today patient appears to be doing well in  regard to her sacral ulcer. She sure signs of good granulation and the Gauze VAC is doing very well for her. Overall I'm pleased in this regard. The biggest issue is that the time from Friday till Monday seems to be quite long for her as far as the amount of time to keep the Wound VAC sealed. For that reason her son wonders if he can take this off on Sunday morning in order to give her day of just wet to dry dressing's with the Dakin's solution I think this would be okay. 05/13/18 on evaluation today patient appears to be doing okay in regard to her sacral ulcer. There does not appear to be any evidence of overt infection at this time which is good news. In general the drainage and moisture appears to be significantly improved this was after the pressure on the Wound VAC was increased after they contacted our office. With that being said a lot of the dark tissue around the edge of the wound has improved in the maceration is significantly better this seems to be managing much better. Fortunately there is no signs of infection at this time. Overall the patient is doing quite well. The wound measurements are not significantly smaller today although they were over the past two visits we will see how things go. 05/27/18 on evaluation today patient's sacral wound actually appears to be doing fairly well at this point. She does have a deep tissue injury yet again on the medial aspect of the wound which is something that we seem to be seeing often. There is some question as to whether or not this is actually being caused by the Wound VAC bunching up and then her somewhat laying on it even though they are attempting to offload this as best as possible. Patient son states that overall they do the best they can trying offload her and I definitely believe him. I  may want to see about putting the Wound VAC on hold for a couple weeks and see if this happens just with standard dressing changes. 06/10/18 on evaluation  today patient actually appears to be doing better in regard to her sacral wound. Overall there's no signs of infection, no odor, and no significant skin breakdown around the edge of the wound I feel like this is done much better 50 Dakinos soaked gauze dressing as opposed to legalizing anything such as the Wound VAC. I believe that we discontinue the Wound VAC as of today. 06/24/18 on evaluation today patient actually appears to be doing well in regard to her sacral ulcer. There's no signs of infection, no odor, and in my pinion no concerns at all. I feel like she is making good progress as far as I'm concerned. Electronic Signature(s) Signed: 06/24/2018 5:40:27 PM By: Lenda Kelp PA-C Entered By: Lenda Kelp on 06/24/2018 11:13:30 Jessica [name] ([id number]) -------------------------------------------------------------------------------- Physical Exam Details [name]. Date of Service: 06/24/2018 11:00 AM Medical Record Number: [id number] Patient Account Number: [id number] Date of Birth/Sex: [birth year] (73 y.o. F) Treating RN: Curtis Sites Primary Care Provider: Annita Brod Other Clinician: Referring Provider: Annita Brod Treating Provider/Extender: STONE III, HOYT Weeks in Treatment: 56 Constitutional Well-nourished and well-hydrated in no acute distress. Respiratory normal breathing without difficulty. clear to auscultation bilaterally. Cardiovascular regular rate and rhythm with normal S1, S2. Psychiatric this patient is able to make decisions and demonstrates good insight into disease process. Alert and Oriented x 3. pleasant and cooperative. Notes Patient's wound bed currently shows good granulation again there is no sign of any infection or anything else going on that would make me more concerned at this point. Fortunately she seems to be doing very well I think even better than when she have the Wound VAC. Electronic  Signature(s) Signed: 06/24/2018 5:40:27 PM By: Lenda Kelp PA-C Entered By: Lenda Kelp on 06/24/2018 11:13:53 Jessica [name] ([id number]) -------------------------------------------------------------------------------- Physician Orders Details [name]. Date of Service: 06/24/2018 11:00 AM Medical Record Number: [id number] Patient Account Number: [id number] Date of Birth/Sex: [birth year] (73 y.o. F) Treating RN: Curtis Sites Primary Care Provider: Annita Brod Other Clinician: Referring Provider: Annita Brod Treating Provider/Extender: Linwood Dibbles, HOYT Weeks in Treatment: 55 Verbal / Phone Orders: No Diagnosis Coding ICD-10 Coding Code Description L89.154 Pressure ulcer of sacral region, stage 4 M48.00 Spinal stenosis, site unspecified R54 Age-related physical debility G89.4 Chronic pain syndrome E78.2 Mixed hyperlipidemia L89.93 Pressure ulcer of unspecified site, stage 3 Wound Cleansing Wound #1 Medial Sacrum o Other: - please cleanse sacral wound with dakins moistened gauze - do not spray dakins on wound Anesthetic (add to Medication List) Wound #1 Medial Sacrum o Topical Lidocaine 4% cream applied to wound bed prior to debridement (In Clinic Only). Skin Barriers/Peri-Wound Care Wound #1 Medial Sacrum o Skin Prep Primary Wound Dressing Wound #1 Medial Sacrum o Pack wound with: - Dakin's soaked gauze Secondary Dressing Wound #1 Medial Sacrum o ABD pad - secure with tape Dressing Change Frequency Wound #1 Medial Sacrum o Change dressing twice daily. Follow-up Appointments Wound #1 Medial Sacrum o Other: - Tuesday 07/05/2018 Off-Loading Wound #1 Medial Sacrum o Turn and reposition every 2 hours Jessica [name], [name] ([id number]) Additional Orders / Instructions Wound #1 Medial Sacrum o Increase protein intake. Home Health Wound #1 Medial Sacrum o D/C Home Health Services Electronic Signature(s) Signed:  06/24/2018 5:05:27 PM By: Curtis Sites Signed: 06/24/2018 5:40:27 PM By:  Linwood Dibbles, Hoyt PA-C Entered By: Curtis Sites on 06/24/2018 11:09:20 Jessica [name] ([id number]) -------------------------------------------------------------------------------- Problem List Details [name]. Date of Service: 06/24/2018 11:00 AM Medical Record Number: [id number] Patient Account Number: [id number] Date of Birth/Sex: Jun 17, 1945 (73 y.o. F) Treating RN: Curtis Sites Primary Care Provider: Annita Brod Other Clinician: Referring Provider: Annita Brod Treating Provider/Extender: STONE III, HOYT Weeks in Treatment: 56 Active Problems ICD-10 Evaluated Encounter Code Description Active Date Today Diagnosis L89.154 Pressure ulcer of sacral region, stage 4 05/27/2017 No Yes M48.00 Spinal stenosis, site unspecified 05/27/2017 No Yes R54 Age-related physical debility 05/27/2017 No Yes G89.4 Chronic pain syndrome 05/27/2017 No Yes E78.2 Mixed hyperlipidemia 05/27/2017 No Yes L89.93 Pressure ulcer of unspecified site, stage 3 05/27/2017 No Yes Inactive Problems Resolved Problems Electronic Signature(s) Signed: 06/24/2018 5:40:27 PM By: Lenda Kelp PA-C Entered By: Lenda Kelp on 06/24/2018 10:43:35 Jessica [name] ([id number]) -------------------------------------------------------------------------------- Progress Note Details [name]. Date of Service: 06/24/2018 11:00 AM Medical Record Number: [id number] Patient Account Number: [id number] Date of Birth/Sex: 1945/05/07 (73 y.o. F) Treating RN: Curtis Sites Primary Care Provider: Annita Brod Other Clinician: Referring Provider: Annita Brod Treating Provider/Extender: Linwood Dibbles, HOYT Weeks in Treatment: 56 Subjective Chief Complaint Information obtained from Patient she is here for evaluation of a sacral ulcer and bilateral lower extremity ulcers History of Present Illness  (HPI) 05/27/17-she is here in initial evaluation for a left-sided sacral stage IV pressure ulcer and bilateral lower extremity, lateral aspect, unstageable pressure ulcers. She is accompanied by her husband and her son, who are her primary caregivers. She is bedbound secondary to spinal stenosis. According to her son and husband she was hospitalized from 10/5-11/1 with healthcare associated pneumonia and altered mental status. During her hospitalization she was intubated, extubated on 10/27. She was discharged with Foley catheter and follow up with urology. According to her son and spouse she developed the sacral ulcer during hospitalization. Home health has been applying Santyl daily. She does have a low air loss mattress and is repositioned every 2-3 hours per her family's report. According to the son and spouse she had an appointment with urology on 12/18 and during that appointment developed discoloration to her bilateral lower extremities which ultimately developed into unstageable pressure ulcers to the lateral aspects of her bilateral lower extremities. There has been no topical treatment applied to these. She continues to have home health. There is no concerns expressed regarding dietary intake, stating she eats 3 meals a day, eating was provided; she is supplemented with boost with protein. 06/03/17-she is here in follow-up evaluation for sacral and bilateral lower extremity ulcers. Plain film x-ray done today reveals no distraction to the sacrum or coccyx, no visible abnormalities. Home health has ordered the negative pressure wound system but it has not been initiated. We will continue with Hydrofera Blue until initiation of negative pressure wound system and continue with Santyl to bilateral lower extremity ulcers. Follow-up next week 06/10/17-she is here in follow-up evaluation for sacral and bilateral lower extremity ulcers. The wound VAC will be available tomorrow per home health. We  will initiate wound VAC therapy to the sacral ulcer 3 times weekly (Thursday, Saturday/Sunday, Tuesday). We will continue with Santyl to the lower extremity ulcers. The patient's son is checking into home health therapy over the weekend for [facility] changes, with the understanding that if VAC changes cannot be performed over the weekend he will need to change his mother's appointments to Monday, Wednesday or Friday.  The sacral ulcer clinically does not appear infected but there has been a change in the amount of drainage acutely, there is no significant amount of devitalized tissue, there is no malodor. Wound culture was obtained to evaluate for occult infection we will hold off on antibiotic therapy until sensitivities are resulted. 06/18/17 on evaluation today patient appears to be doing fairly well in my opinion although this is the first time I have seen this patient she has been previously evaluated by Tacey Ruiz here in our office. She is going to be switching to Fridays to see me due to the Wound VAC schedule being changed on Monday, Wednesday, and Friday. Subsequently she seems to be doing fairly well with the Wound VAC. Her son who was present during evaluation today states that he somewhat stresses of the Wound VAC in making sure that it was functioning appropriately. With that being said everything seems to be working well he knows what settings on the [facility] itself to look at and ensure that it is functioning properly. Overall the wound appears to be nice and clean there is no need for debridement today. She has no discomfort in her bilateral lower extremity ulcers also appear to be improving based on measurements and what this honest tell me about the overall appearance. 07/02/17 on evaluation today I noted in patients wound bed that was actually an odor that had not previously been noted. Subsequently there was also a small area of bone that was not previously noted during my last evaluation. This did  appear to be necrotic and was being somewhat forced out by the body around the region of granulation. She does not have any pain which is good news. Nonetheless the overall appearance of the ulcer is making me concerned for the patient having developed osteomyelitis. Currently she is not been on any antibiotics and the Wound VAC has been doing fairly well in general. With that being said I do not think we need to continue the Wound VAC if she potentially has a bone infection. At least not until it is properly addressed with antibiotics. Jessica [name], Jessica [name] ([id number]) 07/09/17 on evaluation today patient appears to actually be doing very well in regard to her sacral ulcer compared to last week. There is actually no exposed bone at this point. 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 With that being said still I do believe she needs to have a referral to infectious disease due to the osteomyelitis I am gonna recommend that she continue with the doxycycline based on the culture results which showed Eikenella Corrodens as the organism identified that tetracycline should work for. She does not seem to be having any discomfort whatsoever at this point. 07/16/17 on evaluation today patient actually appears to be doing rather well in regard to her sacral wound. I think that the original wound site actually appears much better than previously noted. With that being said she does have a new superficial injury on the right sacral region which appears to be due to according to the sign transport that occurred for her MRI unfortunately. Fortunately this is not too deep and I do think it can be managed but it was a new area that was not previously noted. Otherwise she has been tolerating the Dakin s soaked gauze packing without complication. 07/30/17 on  evaluation today patient presents for  reevaluation concerning her sacral wound. She has been tolerating the dressing changes without complication. With that being said her wound is doing so well at this point that I think she may be at the point where we could reinitiate the Wound VAC currently and hopefully see good results from this. I'm very pleased with how she has responded to the [facility] s soaked gauze packing. 08/13/17 evaluation today patient appears to be doing excellent in regard to her lower extremity ulcer this seems to be cleaning up very nicely. In regard to the sacral ulcer the area of trauma on the right lateral portion of the wound actually appears to be much better than previously noted during the last evaluation. The word has definitely filled in and the Wound VAC appears to be helpful I do believe. Overall I'm very pleased with how things seem to be progressing at this time. Patient likewise is also happy that things are doing well. 08/27/17 on evaluation today patient presents for follow-up concerning her ongoing issues with her sacral ulcer and lower extremity ulcer. Fortunately she has been tolerating the dressing changes well complication the Wound VAC in general seems to have done very well up to this point. She does not have any evidence of infection which is good news. She does have some dark discoloration in central portion of the wound which is troublesome for the possibility of pressure injury to the site it sounds like her prior air mattress was not functioning properly her son has just bought a new one for her this is doing much better. Otherwise things seem to be progressing nicely. 09/10/17 on evaluation today patient presents for follow-up concerning her sacral wound and lower extremity ulcer. She has been tolerating the switch of her dressing changes to the Dakin s soaked gauze dressing very well in regard to the sacrum. The left lateral lower extremity ulcer appears to  have a new area open just distal to the one that we have been treating with Santyl and this is new since her last visit. There does not appear to be any evidence of infection which is good news. With that being said there's a lot of maceration here at the site and I feel this is likely due to the switch and the dressings it appears that due to the sticking of the dressings it will switch to utilizing a telfa pad over the Santyl. I think this caused a lot of drainage to be collected and situated in the region just under the dressing which has led to this causing which duration of breakdown. Overall there does not appear to be any significant pain which is good news. Of note I did actually have a conversation with the radiologist who is an interventional radiologist with [facility]. 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 With that being said there does not appear to be any rush to get this done according to the interventional radiologist whom I spoke with. Therefore after discussion it was determined that there gonna wait about six months prior to considering the procedure to get time for hopefully the sacral wound to heal in the interim to at least some degree. 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 According to nursing staff these are all improved, the negative pressure wound therapy system was put on hold last week. We will resume negative pressure wound therapy today, continue with  Santyl to the lower extremity and she will follow-up next week. 09/24/17 on evaluation today unfortunately the patient's wound appears to be doing significantly worse compared to last week's evaluation and even my valuation the week before. She actually has bone noted on the left wound margin in the region of undermining unfortunately. This was not noted during the last evaluation. I am  concerned about the fact that this seems to be worse not better since our last visit with her. My biggest concern is that she's likely developing a worsening osteomyelitis. This was discussed with patient and her son today during the office visit. 05/03/17 on evaluation today patient actually appears to be doing rather well in regard to her sacral ulcer compared to last week's evaluation. She has been tolerating the dressing changes without complication. Fortunately even though we're not utilizing the Wound VAC she seems to be making some strides in seeing the wound area overall improved quite dramatically in my pinion in just one weeks time. She also seems to be staying off of this to the point that I do not see any evidence of new injury which is excellent news. Whatever she's been doing in that regard over the past week I would want her to continue. Jessica [name], Jessica [name] ([id number]) did also review the results of her bone culture which revealed group B strep as the responsible organism. Again this is what's causing her osteomyelitis she does have infectious disease appointment scheduled for 10/11/17 10/08/1898 valuation today patient appears to be doing better for the most part in regard to the sacral wound. Overall she is showing signs of having granulation of the bone which is good news. There is an area towards the left of the wound where she does seem to be showing some signs of opening this it would be due to additional pressure to the area unfortunately. There does not appear to be any evidence of infection spreading which is good news that do not see any evidence of significant purulent discharge which is also good news. 10/26/17 on evaluation today patient sacral ulcer actually appears to be very healthy and doing well in regard to the overall appearance of the wound. With that being said she does seem to be having some issues at this point in time with some shear/friction injury of the  sacrum from where she was transported to [facility] for her infectious disease appointment. She has been placed on IV antibiotic therapy I will have to go back and find that note for review as I did not have access to that today. Nonetheless I will add the next visit be sure to document the exact antibiotics that she is taking at this point. Nonetheless I do believe the wound is doing better there's no bone exposure nothing that requires debridement in regard to the sacral wound. Likewise her left lateral lower extremity ulcer also appears to be doing significantly better. In general I feel like things are showing signs of improvement all around. 11/12/17 on evaluation today patient appears to be doing rather well in regard to her sacral wound. In general I do see signs of granulation at this point. Fortunately there does not appear to be any evidence of infection which is good news as well. In general overall happy with the appearance. With that being said I'll be see I would like for this to be progressing more rapidly as with the patient and her son but nonetheless at least things do not appear to be doing worse.  Her lower extremity ulcer is doing significantly better. 11/26/17 on evaluation today patient appears to be doing a little better in regard to the sacral wound especially in the peripheral of the wound bed. She actually did have an abrasion on the right gluteal region that's completely resolved to this point. Her lower extremity also appears to be for the most part completely resolved. Overall the sacrum itself seems to be the one thing that is still open and given her trouble. No fevers, chills, nausea, or vomiting noted at this time. She actually has an appointment next week with infectious disease for recheck to see how things are doing. She maybe switch to oral medications at that point. 12/09/17 on evaluation today patient actually appears to be doing much better in regard to her sacral  wound. I do feel like she has made good progress at this time as far as healing is concerned. In fact the wound looks better to me today than it has in quite some time. She is on oral antibiotic therapy and no longer on the IV antibiotics. She did see infectious disease last week. 12/24/17 on evaluation today patient's wounds actually appear to be doing decently well in regard to the sacral wound in particular. When it comes to her lower extremity ulcers both appear to be completely healed at this point in the one area where she had lived the rash has completely resolved at this time. Overall very pleased with the progress that has been made. Nonetheless the patient seems to be doing well in general. She still does have quite a bit of healing to do in regard to the sacral wound but I feel like she is making progress. 01/07/18 on evaluation today patient actually appears to be doing excellent today regard to her sacral wound. The granular quality in the base of the wound is excellent and shown signs of good improvement there's no evidence of contusion nor deep tissue injury and the periwound actually appears to be doing well. In general I'm very pleased with the overall appearance. 02/04/18 on evaluation today patient's wound actually appears to be doing decently well as far as the granulation is concerned. She has been tolerating the dressing changes without complication. Right now we are still using the Wound VAC. Nonetheless overall there apparently is some cater to the wound and this is concerning simply for the fact that she could be developing a soft tissue infection again. She's had this happen in the past and at that time responded very well to the antibiotics. Nonetheless I don't think I would likely put her on anything prophylactically but rather wait for the results of the culture to return. 02/18/18 on evaluation today patient actually appears to be doing fairly well. She has been  tolerating the dressing changes. And the Wound VAC seems to be doing well. Overall I'm very pleased with how things have improved over the past couple weeks since I last saw her. Fortunately there does not appear to be any evidence of overt infection at this time which is good news. She has been taking the antibiotics without complication. Specifically that is the Bactrim DS which was prescribed on 02/08/18. 03/18/18 on evaluation today patient's wound actually does have quite a bit of odor noted compared to previous. With that Jessica [name], Jessica [name]. ([id number]) being said the wound itself overall does not appear to be too significant or worse. There is no event noted in the wound bed which is good news. With that being said  the patient has been worried as the home health nurse stated that she thought she saw bone in the base of the wound. Nonetheless there's a little bit of bruising along the left lateral border of the wound bed which again does not appear to be terrible we have noticed this before but other than this the majority the wound bed appears to be doing excellent in my pinion. I am wondering if there may be some bacterial colonization. This could be leading to some of the odor that we are noting with the Wound VAC. 04/01/18 on evaluation today patient appears to actually be doing well in regard to the overall appearance of the wound itself. She also does not have any odor at this point in regard to the wound surface and I do believe the Bacons is extremely helpful in this regard. With that being said she does have a little bit of deep tissue injury on the medial aspect of the wound. Again last time it was more lateral. The lateral seems to have improved quite well. Nonetheless she also notes by way of her son that this has been very macerated in the past several weeks since I last saw her. Though things overall seem to be doing better I still think that the Wound VAC may help with managing  her fluid better I'm wondering if we can attempt a gauze Wound VAC my hope is that this will be able to manage the moisture control as well as continuing with managing the odor of the wound and bacterial load since the Dakin s has done excellent in helping in that regard. 04/15/17 on evaluation today patient actually appears to be doing much better in regard to the overall appearance of her sacral wound. I do believe that the gauze Vac has been of benefit for her. Overall she is tolerating this without complication which is also good news. There is no sign of injury to the sacral region at this point. 04/29/18 on evaluation today patient appears to be doing well in regard to her sacral ulcer. She sure signs of good granulation and the Gauze VAC is doing very well for her. Overall I'm pleased in this regard. The biggest issue is that the time from Friday till Monday seems to be quite long for her as far as the amount of time to keep the Wound VAC sealed. For that reason her son wonders if he can take this off on Sunday morning in order to give her day of just wet to dry dressing's with the Dakin's solution I think this would be okay. 05/13/18 on evaluation today patient appears to be doing okay in regard to her sacral ulcer. There does not appear to be any evidence of overt infection at this time which is good news. In general the drainage and moisture appears to be significantly improved this was after the pressure on the Wound VAC was increased after they contacted our office. With that being said a lot of the dark tissue around the edge of the wound has improved in the maceration is significantly better this seems to be managing much better. Fortunately there is no signs of infection at this time. Overall the patient is doing quite well. The wound measurements are not significantly smaller today although they were over the past two visits we will see how things go. 05/27/18 on evaluation today  patient's sacral wound actually appears to be doing fairly well at this point. She does have a deep tissue injury  yet again on the medial aspect of the wound which is something that we seem to be seeing often. There is some question as to whether or not this is actually being caused by the Wound VAC bunching up and then her somewhat laying on it even though they are attempting to offload this as best as possible. Patient son states that overall they do the best they can trying offload her and I definitely believe him. I may want to see about putting the Wound VAC on hold for a couple weeks and see if this happens just with standard dressing changes. 06/10/18 on evaluation today patient actually appears to be doing better in regard to her sacral wound. Overall there's no signs of infection, no odor, and no significant skin breakdown around the edge of the wound I feel like this is done much better 50 Dakin s soaked gauze dressing as opposed to legalizing anything such as the Wound VAC. I believe that we discontinue the Wound VAC as of today. 06/24/18 on evaluation today patient actually appears to be doing well in regard to her sacral ulcer. There's no signs of infection, no odor, and in my pinion no concerns at all. I feel like she is making good progress as far as I'm concerned. Patient History Information obtained from Patient. Family History No family history of Cancer, Diabetes, Heart Disease. Social History Never smoker, Marital Status - Married. Review of Systems (ROS) Constitutional Symptoms (General Health) Jessica [name], Jessica [name]. ([id number]) Denies complaints or symptoms of Fever, Chills. Respiratory The patient has no complaints or symptoms. Cardiovascular The patient has no complaints or symptoms. Psychiatric The patient has no complaints or symptoms. Objective Constitutional Well-nourished and well-hydrated in no acute distress. Vitals Time Taken: 10:38 AM, Height: 70 in,  Temperature: 98.2 F, Pulse: 71 bpm, Respiratory Rate: 16 breaths/min, Blood Pressure: 154/63 mmHg. Respiratory normal breathing without difficulty. clear to auscultation bilaterally. Cardiovascular regular rate and rhythm with normal S1, S2. Psychiatric this patient is able to make decisions and demonstrates good insight into disease process. Alert and Oriented x 3. pleasant and cooperative. General Notes: Patient's wound bed currently shows good granulation again there is no sign of any infection or anything else going on that would make me more concerned at this point. Fortunately she seems to be doing very well I think even better than when she have the Wound VAC. Integumentary (Hair, Skin) Wound #1 status is Open. Original cause of wound was Pressure Injury. The wound is located on the Medial Sacrum. The wound measures 7.9cm length x 4.5cm width x 3cm depth; 27.921cm^2 area and 83.763cm^3 volume. There is muscle, Fat Layer (Subcutaneous Tissue) Exposed, and fascia exposed. There is no tunneling or undermining noted. There is a medium amount of sanguinous drainage noted. Foul odor after cleansing was noted. The wound margin is distinct with the outline attached to the wound base. There is large (67-100%) red, pink, hyper - granulation within the wound bed. There is a small (1-33%) amount of necrotic tissue within the wound bed including Adherent Slough. The periwound skin appearance exhibited: Induration, Scarring, Maceration, Rubor, Erythema. The periwound skin appearance did not exhibit: Callus, Crepitus, Excoriation, Rash, Dry/Scaly, Atrophie Blanche, Cyanosis, Ecchymosis, Hemosiderin Staining, Mottled, Pallor. The surrounding wound skin color is noted with erythema which is circumferential. Periwound temperature was noted as No Abnormality. The periwound has tenderness on palpation. Assessment Jessica [name], Jessica [name] ([id number]) Active Problems ICD-10 Pressure ulcer of sacral region,  stage 4 Spinal stenosis, site unspecified Age-related physical  debility Chronic pain syndrome Mixed hyperlipidemia Pressure ulcer of unspecified site, stage 3 Plan Wound Cleansing: Wound #1 Medial Sacrum: Other: - please cleanse sacral wound with dakins moistened gauze - do not spray dakins on wound Anesthetic (add to Medication List): Wound #1 Medial Sacrum: Topical Lidocaine 4% cream applied to wound bed prior to debridement (In Clinic Only). Skin Barriers/Peri-Wound Care: Wound #1 Medial Sacrum: Skin Prep Primary Wound Dressing: Wound #1 Medial Sacrum: Pack wound with: - Dakin's soaked gauze Secondary Dressing: Wound #1 Medial Sacrum: ABD pad - secure with tape Dressing Change Frequency: Wound #1 Medial Sacrum: Change dressing twice daily. Follow-up Appointments: Wound #1 Medial Sacrum: Other: - Tuesday 07/05/2018 Off-Loading: Wound #1 Medial Sacrum: Turn and reposition every 2 hours Additional Orders / Instructions: Wound #1 Medial Sacrum: Increase protein intake. Home Health: Wound #1 Medial Sacrum: D/C Home Health Services My suggestion is that we go ahead and continue with the above wound care measures for the next week and the patient is in agreement the plan. We will see her back in roughly 2 weeks although we gonna switch her to Tuesdays to see if this aids with her transportation and keeping her from having to be here in the clinic for such a long period of time. It seems like that Friday see you busier for EMS transport. We will otherwise see were things stand at follow-up. Please see above for specific wound care orders. We will see patient for re-evaluation in 2 week(s) here in the clinic. If anything worsens or changes patient will contact our office for additional recommendations. GAEL, DELUDE ([id number]) Electronic Signature(s) Signed: 06/24/2018 5:40:27 PM By: Lenda Kelp PA-C Entered By: Lenda Kelp on 06/24/2018 11:14:42 Jessica [name] ([id number]) -------------------------------------------------------------------------------- ROS/PFSH Details [name]. Date of Service: 06/24/2018 11:00 AM Medical Record Number: [id number] Patient Account Number: [id number] Date of Birth/Sex: 1945/11/08 (73 y.o. F) Treating RN: Curtis Sites Primary Care Provider: Annita Brod Other Clinician: Referring Provider: Annita Brod Treating Provider/Extender: STONE III, HOYT Weeks in Treatment: 56 Information Obtained From Patient Constitutional Symptoms (General Health) Complaints and Symptoms: Negative for: Fever; Chills Respiratory Complaints and Symptoms: No Complaints or Symptoms Cardiovascular Complaints and Symptoms: No Complaints or Symptoms Psychiatric Complaints and Symptoms: No Complaints or Symptoms Immunizations Pneumococcal Vaccine: Received Pneumococcal Vaccination: No Implantable Devices No devices added Family and Social History Cancer: No; Diabetes: No; Heart Disease: No; Never smoker; Marital Status - Married Physician Affirmation I have reviewed and agree with the above information. Electronic Signature(s) Signed: 06/24/2018 5:05:27 PM By: Curtis Sites Signed: 06/24/2018 5:40:27 PM By: Lenda Kelp PA-C Entered By: Lenda Kelp on 06/24/2018 11:13:42 Jessica [name] ([id number]) -------------------------------------------------------------------------------- SuperBill Details [name]. Date of Service: 06/24/2018 Medical Record Number: [id number] Patient Account Number: [id number] Date of Birth/Sex: 06-Jan-1946 (73 y.o. F) Treating RN: Curtis Sites Primary Care Provider: Annita Brod Other Clinician: Referring Provider: Annita Brod Treating Provider/Extender: STONE III, HOYT Weeks in Treatment: 56 Diagnosis Coding ICD-10 Codes Code Description L89.154 Pressure ulcer of sacral region, stage 4 M48.00 Spinal stenosis, site  unspecified R54 Age-related physical debility G89.4 Chronic pain syndrome E78.2 Mixed hyperlipidemia L89.93 Pressure ulcer of unspecified site, stage 3 Facility Procedures CPT4 Code: [id number] Description: 99213 - WOUND CARE VISIT-LEV 3 EST PT Modifier: Quantity: 1 Physician Procedures CPT4 Code: [id number] Description: 99214 - WC PHYS LEVEL 4 - EST PT ICD-10 Diagnosis Description L89.154 Pressure ulcer of sacral region, stage 4 M48.00 Spinal stenosis, site unspecified R54  Age-related physical debility G89.4 Chronic pain syndrome Modifier: Quantity: 1 Electronic Signature(s) Signed: 06/24/2018 5:40:27 PM By: Lenda Kelp PA-C Entered By: Lenda Kelp on 06/24/2018 11:15:03

## 2018-07-05 ENCOUNTER — Ambulatory Visit: Payer: Self-pay | Admitting: Physician Assistant

## 2018-07-12 ENCOUNTER — Encounter: Payer: Medicare Other | Admitting: Physician Assistant

## 2018-07-12 ENCOUNTER — Other Ambulatory Visit: Payer: Self-pay

## 2018-07-12 DIAGNOSIS — L89154 Pressure ulcer of sacral region, stage 4: Secondary | ICD-10-CM | POA: Diagnosis not present

## 2018-07-13 NOTE — Progress Notes (Signed)
Jessica [name], Jessica [name] ([id number]) Visit Report for 07/12/2018 Chief Complaint Document Details [name], Jessica [name]. Date of Service: 07/12/2018 11:00 AM Medical Record Number: [id number] Patient Account Number: [id number] Date of Birth/Sex: 16-Jan-1946 (73 y.o. F) Treating RN: Curtis Sites Primary Care Provider: Annita Brod Other Clinician: Referring Provider: Annita Brod Treating Provider/Extender: Linwood Dibbles, Ponce Skillman Weeks in Treatment: 85 Information Obtained from: Patient Chief Complaint she is here for evaluation of a sacral ulcer and bilateral lower extremity ulcers Electronic Signature(s) Signed: 07/12/2018 8:26:01 PM By: Lenda Kelp PA-C Entered By: Lenda Kelp on 07/12/2018 10:51:42 Jessica [name] ([id number]) -------------------------------------------------------------------------------- HPI Details [name]. Date of Service: 07/12/2018 11:00 AM Medical Record Number: [id number] Patient Account Number: [id number] Date of Birth/Sex: [birth year] (73 y.o. F) Treating RN: Curtis Sites Primary Care Provider: Annita Brod Other Clinician: Referring Provider: Annita Brod Treating Provider/Extender: Linwood Dibbles, Bradly Sangiovanni Weeks in Treatment: 49 History of Present Illness HPI Description: 05/27/17-she is here in initial evaluation for a left-sided sacral stage IV pressure ulcer and bilateral lower extremity, lateral aspect, unstageable pressure ulcers. She is accompanied by her husband and her son, who are her primary caregivers. She is bedbound secondary to spinal stenosis. According to her son and husband she was hospitalized from 10/5-11/1 with healthcare associated pneumonia and altered mental status. During her hospitalization she was intubated, extubated on 10/27. She was discharged with Foley catheter and follow up with urology. According to her son and spouse she developed the sacral ulcer during hospitalization. Home health has  been applying Santyl daily. She does have a low air loss mattress and is repositioned every 2-3 hours per her family's report. According to the son and spouse she had an appointment with urology on 12/18 and during that appointment developed discoloration to her bilateral lower extremities which ultimately developed into unstageable pressure ulcers to the lateral aspects of her bilateral lower extremities. There has been no topical treatment applied to these. She continues to have home health. There is no concerns expressed regarding dietary intake, stating she eats 3 meals a day, eating was provided; she is supplemented with boost with protein. 06/03/17-she is here in follow-up evaluation for sacral and bilateral lower extremity ulcers. Plain film x-ray done today reveals no distraction to the sacrum or coccyx, no visible abnormalities. Home health has ordered the negative pressure wound system but it has not been initiated. We will continue with Hydrofera Blue until initiation of negative pressure wound system and continue with Santyl to bilateral lower extremity ulcers. Follow-up next week 06/10/17-she is here in follow-up evaluation for sacral and bilateral lower extremity ulcers. The wound VAC will be available tomorrow per home health. We will initiate wound VAC therapy to the sacral ulcer 3 times weekly (Thursday, Saturday/Sunday, Tuesday). We will continue with Santyl to the lower extremity ulcers. The patient's son is checking into home health therapy over the weekend for [facility] changes, with the understanding that if VAC changes cannot be performed over the weekend he will need to change his mother's appointments to Monday, Wednesday or Friday. The sacral ulcer clinically does not appear infected but there has been a change in the amount of drainage acutely, there is no significant amount of devitalized tissue, there is no malodor. Wound culture was obtained to evaluate for occult infection we  will hold off on antibiotic therapy until sensitivities are resulted. 06/18/17 on evaluation today patient appears to be doing fairly well in my opinion although this is the first time I  have seen this patient she has been previously evaluated by Tacey RuizLeah here in our office. She is going to be switching to Fridays to see me due to the Wound VAC schedule being changed on Monday, Wednesday, and Friday. Subsequently she seems to be doing fairly well with the Wound VAC. Her son who was present during evaluation today states that he somewhat stresses of the Wound VAC in making sure that it was functioning appropriately. With that being said everything seems to be working well he knows what settings on the [facility] HVAC itself to look at and ensure that it is functioning properly. Overall the wound appears to be nice and clean there is no need for debridement today. She has no discomfort in her bilateral lower extremity ulcers also appear to be improving based on measurements and what this honest tell me about the overall appearance. 07/02/17 on evaluation today I noted in patients wound bed that was actually an odor that had not previously been noted. Subsequently there was also a small area of bone that was not previously noted during my last evaluation. This did appear to be necrotic and was being somewhat forced out by the body around the region of granulation. She does not have any pain which is good news. Nonetheless the overall appearance of the ulcer is making me concerned for the patient having developed osteomyelitis. Currently she is not been on any antibiotics and the Wound VAC has been doing fairly well in general. With that being said I do not think we need to continue the Wound VAC if she potentially has a bone infection. At least not until it is properly addressed with antibiotics. 07/09/17 on evaluation today patient appears to actually be doing very well in regard to her sacral ulcer compared to last  week. There is actually no exposed bone at this point. 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 With that being said still I do believe she needs to have a referral to infectious disease due to the osteomyelitis I am gonna recommend that she continue with the doxycycline based on the culture results which showed Eikenella Corrodens as the Jessica [name], [name] ([id number]) organism identified that tetracycline should work for. She does not seem to be having any discomfort whatsoever at this point. 07/16/17 on evaluation today patient actually appears to be doing rather well in regard to her sacral wound. I think that the original wound site actually appears much better than previously noted. With that being said she does have a new superficial injury on the right sacral region which appears to be due to according to the sign transport that occurred for her MRI unfortunately. Fortunately this is not too deep and I do think it can be managed but it was a new area that was not previously noted. Otherwise she has been tolerating the Dakinos soaked gauze packing without complication. 07/30/17 on evaluation today patient presents for reevaluation concerning her sacral wound. She has been tolerating the dressing changes without complication. With that being said her wound is doing so well at this point that I think she may be at the point where we could reinitiate the Wound VAC currently and hopefully see good results from this. I'm very pleased with how she has responded to the Dakinos soaked gauze packing. 08/13/17 evaluation today patient appears to be doing excellent in  regard to her lower extremity ulcer this seems to be cleaning up very nicely. In regard to the sacral ulcer the area of trauma on the right lateral portion of the wound actually appears  to be much better than previously noted during the last evaluation. The word has definitely filled in and the Wound VAC appears to be helpful I do believe. Overall I'm very pleased with how things seem to be progressing at this time. Patient likewise is also happy that things are doing well. 08/27/17 on evaluation today patient presents for follow-up concerning her ongoing issues with her sacral ulcer and lower extremity ulcer. Fortunately she has been tolerating the dressing changes well complication the Wound VAC in general seems to have done very well up to this point. She does not have any evidence of infection which is good news. She does have some dark discoloration in central portion of the wound which is troublesome for the possibility of pressure injury to the site it sounds like her prior air mattress was not functioning properly her son has just bought a new one for her this is doing much better. Otherwise things seem to be progressing nicely. 09/10/17 on evaluation today patient presents for follow-up concerning her sacral wound and lower extremity ulcer. She has been tolerating the switch of her dressing changes to the Dakinos soaked gauze dressing very well in regard to the sacrum. The left lateral lower extremity ulcer appears to have a new area open just distal to the one that we have been treating with Santyl and this is new since her last visit. There does not appear to be any evidence of infection which is good news. With that being said there's a lot of maceration here at the site and I feel this is likely due to the switch and the dressings it appears that due to the sticking of the dressings it will switch to utilizing a telfa pad over the Santyl. I think this caused a lot of drainage to be collected and situated in the region just under the dressing which has led to this causing which duration of breakdown. Overall there does not appear to be any significant pain which is good  news. Of note I did actually have a conversation with the radiologist who is an interventional radiologist with [facility]. 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 With that being said there does not appear to be any rush to get this done according to the interventional radiologist whom I spoke with. Therefore after discussion it was determined that there gonna wait about six months prior to considering the procedure to get time for hopefully the sacral wound to heal in the interim to at least some degree. 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 According to nursing staff these are all improved, the negative pressure wound therapy system was put on hold last week. We will resume negative pressure wound therapy today, continue with Santyl to the lower extremity and she will follow-up next week. 09/24/17 on evaluation today unfortunately the patient's wound appears to be doing significantly worse compared to last week's evaluation and even my valuation the week before. She actually has bone noted on the left wound margin in the region of undermining unfortunately. This was not noted during the last evaluation. I am concerned about the fact that this seems to be worse not better since  our last visit with her. My biggest concern is that she's likely developing a worsening osteomyelitis. This was discussed with patient and her son today during the office visit. 05/03/17 on evaluation today patient actually appears to be doing rather well in regard to her sacral ulcer compared to last week's evaluation. She has been tolerating the dressing changes without complication. Fortunately even though we're not utilizing the Wound VAC she seems to be making some strides in seeing the wound area overall improved quite dramatically in my pinion in just one weeks time. She also seems to  be staying off of this to the point that I do not see any evidence of new injury which is excellent news. Whatever she's been doing in that regard over the past week I would want her to continue. I did also review the results of her bone culture which revealed group B strep as the responsible organism. Again this is what's causing her osteomyelitis she does have infectious disease appointment scheduled for 10/11/17 10/08/1898 valuation today patient appears to be doing better for the most part in regard to the sacral wound. Overall she is showing signs of having granulation of the bone which is good news. There is an area towards the left of the wound where she does seem to be showing some signs of opening this it would be due to additional pressure to the area unfortunately. Jessica [name], BORGES. ([id number]) There does not appear to be any evidence of infection spreading which is good news that do not see any evidence of significant purulent discharge which is also good news. 10/26/17 on evaluation today patient sacral ulcer actually appears to be very healthy and doing well in regard to the overall appearance of the wound. With that being said she does seem to be having some issues at this point in time with some shear/friction injury of the sacrum from where she was transported to [facility] for her infectious disease appointment. She has been placed on IV antibiotic therapy I will have to go back and find that note for review as I did not have access to that today. Nonetheless I will add the next visit be sure to document the exact antibiotics that she is taking at this point. Nonetheless I do believe the wound is doing better there's no bone exposure nothing that requires debridement in regard to the sacral wound. Likewise her left lateral lower extremity ulcer also appears to be doing significantly better. In general I feel like things are showing signs of improvement all around. 11/12/17 on  evaluation today patient appears to be doing rather well in regard to her sacral wound. In general I do see signs of granulation at this point. Fortunately there does not appear to be any evidence of infection which is good news as well. In general overall happy with the appearance. With that being said I'll be see I would like for this to be progressing more rapidly as with the patient and her son but nonetheless at least things do not appear to be doing worse. Her lower extremity ulcer is doing significantly better. 11/26/17 on evaluation today patient appears to be doing a little better in regard to the sacral wound especially in the peripheral of the wound bed. She actually did have an abrasion on the right gluteal region that's completely resolved to this point. Her lower extremity also appears to be for the most part completely resolved. Overall the sacrum itself seems to be the one thing that is  still open and given her trouble. No fevers, chills, nausea, or vomiting noted at this time. She actually has an appointment next week with infectious disease for recheck to see how things are doing. She maybe switch to oral medications at that point. 12/09/17 on evaluation today patient actually appears to be doing much better in regard to her sacral wound. I do feel like she has made good progress at this time as far as healing is concerned. In fact the wound looks better to me today than it has in quite some time. She is on oral antibiotic therapy and no longer on the IV antibiotics. She did see infectious disease last week. 12/24/17 on evaluation today patient's wounds actually appear to be doing decently well in regard to the sacral wound in particular. When it comes to her lower extremity ulcers both appear to be completely healed at this point in the one area where she had lived the rash has completely resolved at this time. Overall very pleased with the progress that has been made. Nonetheless  the patient seems to be doing well in general. She still does have quite a bit of healing to do in regard to the sacral wound but I feel like she is making progress. 01/07/18 on evaluation today patient actually appears to be doing excellent today regard to her sacral wound. The granular quality in the base of the wound is excellent and shown signs of good improvement there's no evidence of contusion nor deep tissue injury and the periwound actually appears to be doing well. In general I'm very pleased with the overall appearance. 02/04/18 on evaluation today patient's wound actually appears to be doing decently well as far as the granulation is concerned. She has been tolerating the dressing changes without complication. Right now we are still using the Wound VAC. Nonetheless overall there apparently is some cater to the wound and this is concerning simply for the fact that she could be developing a soft tissue infection again. She's had this happen in the past and at that time responded very well to the antibiotics. Nonetheless I don't think I would likely put her on anything prophylactically but rather wait for the results of the culture to return. 02/18/18 on evaluation today patient actually appears to be doing fairly well. She has been tolerating the dressing changes. And the Wound VAC seems to be doing well. Overall I'm very pleased with how things have improved over the past couple weeks since I last saw her. Fortunately there does not appear to be any evidence of overt infection at this time which is good news. She has been taking the antibiotics without complication. Specifically that is the Bactrim DS which was prescribed on 02/08/18. 03/18/18 on evaluation today patient's wound actually does have quite a bit of odor noted compared to previous. With that being said the wound itself overall does not appear to be too significant or worse. There is no event noted in the wound bed which is good  news. With that being said the patient has been worried as the home health nurse stated that she thought she saw bone in the base of the wound. Nonetheless there's a little bit of bruising along the left lateral border of the wound bed which again does not appear to be terrible we have noticed this before but other than this the majority the wound bed appears to be doing excellent in my pinion. I am wondering if there may be some bacterial colonization. This  could be leading to some of the odor that we are noting with the Wound VAC. Jessica [name], [name] ([id number]) 04/01/18 on evaluation today patient appears to actually be doing well in regard to the overall appearance of the wound itself. She also does not have any odor at this point in regard to the wound surface and I do believe the Bacons is extremely helpful in this regard. With that being said she does have a little bit of deep tissue injury on the medial aspect of the wound. Again last time it was more lateral. The lateral seems to have improved quite well. Nonetheless she also notes by way of her son that this has been very macerated in the past several weeks since I last saw her. Though things overall seem to be doing better I still think that the Wound VAC may help with managing her fluid better I'm wondering if we can attempt a gauze Wound VAC my hope is that this will be able to manage the moisture control as well as continuing with managing the odor of the wound and bacterial load since the Dakinos has done excellent in helping in that regard. 04/15/17 on evaluation today patient actually appears to be doing much better in regard to the overall appearance of her sacral wound. I do believe that the gauze Vac has been of benefit for her. Overall she is tolerating this without complication which is also good news. There is no sign of injury to the sacral region at this point. 04/29/18 on evaluation today patient appears to be doing well in  regard to her sacral ulcer. She sure signs of good granulation and the Gauze VAC is doing very well for her. Overall I'm pleased in this regard. The biggest issue is that the time from Friday till Monday seems to be quite long for her as far as the amount of time to keep the Wound VAC sealed. For that reason her son wonders if he can take this off on Sunday morning in order to give her day of just wet to dry dressing's with the Dakin's solution I think this would be okay. 05/13/18 on evaluation today patient appears to be doing okay in regard to her sacral ulcer. There does not appear to be any evidence of overt infection at this time which is good news. In general the drainage and moisture appears to be significantly improved this was after the pressure on the Wound VAC was increased after they contacted our office. With that being said a lot of the dark tissue around the edge of the wound has improved in the maceration is significantly better this seems to be managing much better. Fortunately there is no signs of infection at this time. Overall the patient is doing quite well. The wound measurements are not significantly smaller today although they were over the past two visits we will see how things go. 05/27/18 on evaluation today patient's sacral wound actually appears to be doing fairly well at this point. She does have a deep tissue injury yet again on the medial aspect of the wound which is something that we seem to be seeing often. There is some question as to whether or not this is actually being caused by the Wound VAC bunching up and then her somewhat laying on it even though they are attempting to offload this as best as possible. Patient son states that overall they do the best they can trying offload her and I definitely believe him. I  may want to see about putting the Wound VAC on hold for a couple weeks and see if this happens just with standard dressing changes. 06/10/18 on evaluation  today patient actually appears to be doing better in regard to her sacral wound. Overall there's no signs of infection, no odor, and no significant skin breakdown around the edge of the wound I feel like this is done much better 50 Dakinos soaked gauze dressing as opposed to legalizing anything such as the Wound VAC. I believe that we discontinue the Wound VAC as of today. 06/24/18 on evaluation today patient actually appears to be doing well in regard to her sacral ulcer. There's no signs of infection, no odor, and in my pinion no concerns at all. I feel like she is making good progress as far as I'm concerned. 07/12/18 on evaluation today patient appears to be doing very well in regard to her sacral ulcer. She has been tolerating the dressing changes without complication. Fortunately there does not appear to be any evidence of active infection at this time she is doing excellent as far as I'm concerned at this point. Electronic Signature(s) Signed: 07/12/2018 8:26:01 PM By: Lenda Kelp PA-C Entered By: Lenda Kelp on 07/12/2018 13:26:51 Jessica [name] ([id number]) -------------------------------------------------------------------------------- Physical Exam Details [name]. Date of Service: 07/12/2018 11:00 AM Medical Record Number: [id number] Patient Account Number: [id number] Date of Birth/Sex: Nov 01, 1945 (73 y.o. F) Treating RN: Curtis Sites Primary Care Provider: Annita Brod Other Clinician: Referring Provider: Annita Brod Treating Provider/Extender: STONE III, Newell Wafer Weeks in Treatment: 16 Constitutional Well-nourished and well-hydrated in no acute distress. Respiratory normal breathing without difficulty. clear to auscultation bilaterally. Cardiovascular regular rate and rhythm with normal S1, S2. Psychiatric this patient is able to make decisions and demonstrates good insight into disease process. Alert and Oriented x 3. pleasant and  cooperative. Notes Patient's wound currently shows evidence of good granulation there's no evidence of infection no evidence of anything breaking down the worsening and definitely no bruising or deep tissue injury. Overall this is actually the best I've seen her wound in a long time and there is no bone exposure. Electronic Signature(s) Signed: 07/12/2018 8:26:01 PM By: Lenda Kelp PA-C Entered By: Lenda Kelp on 07/12/2018 13:40:37 Jessica [name] ([id number]) -------------------------------------------------------------------------------- Physician Orders Details [name]. Date of Service: 07/12/2018 11:00 AM Medical Record Number: [id number] Patient Account Number: [id number] Date of Birth/Sex: 03/04/1946 (73 y.o. F) Treating RN: Curtis Sites Primary Care Provider: Annita Brod Other Clinician: Referring Provider: Annita Brod Treating Provider/Extender: Linwood Dibbles, Dima Ferrufino Weeks in Treatment: 29 Verbal / Phone Orders: No Diagnosis Coding ICD-10 Coding Code Description L89.154 Pressure ulcer of sacral region, stage 4 M48.00 Spinal stenosis, site unspecified R54 Age-related physical debility G89.4 Chronic pain syndrome E78.2 Mixed hyperlipidemia L89.93 Pressure ulcer of unspecified site, stage 3 Wound Cleansing Wound #1 Medial Sacrum o Other: - please cleanse sacral wound with dakins moistened gauze - do not spray dakins on wound Anesthetic (add to Medication List) Wound #1 Medial Sacrum o Topical Lidocaine 4% cream applied to wound bed prior to debridement (In Clinic Only). Skin Barriers/Peri-Wound Care Wound #1 Medial Sacrum o Skin Prep Primary Wound Dressing Wound #1 Medial Sacrum o Pack wound with: - Dakin's soaked gauze Secondary Dressing Wound #1 Medial Sacrum o ABD pad - secure with tape Dressing Change Frequency Wound #1 Medial Sacrum o Change dressing twice daily. Follow-up Appointments Wound #1 Medial  Sacrum o Return Appointment in  2 weeks. Off-Loading Wound #1 Medial Sacrum o Turn and reposition every 2 hours Gaugh, Katria W. ([id number]) Additional Orders / Instructions Wound #1 Medial Sacrum o Increase protein intake. Home Health Wound #1 Medial Sacrum o Initiate Home Health for Skilled Nursing - [facility] o Home Health Nurse may visit PRN to address patientos wound care needs. o FACE TO FACE ENCOUNTER: MEDICARE and MEDICAID PATIENTS: I certify that this patient is under my care and that I had a face-to-face encounter that meets the physician face-to-face encounter requirements with this patient on this date. The encounter with the patient was in whole or in part for the following MEDICAL CONDITION: (primary reason for Home Healthcare) MEDICAL NECESSITY: I certify, that based on my findings, NURSING services are a medically necessary home health service. HOME BOUND STATUS: I certify that my clinical findings support that this patient is homebound (i.e., Due to illness or injury, pt requires aid of supportive devices such as crutches, cane, wheelchairs, walkers, the use of special transportation or the assistance of another person to leave their place of residence. There is a normal inability to leave the home and doing so requires considerable and taxing effort. Other absences are for medical reasons / religious services and are infrequent or of short duration when for other reasons). o If current dressing causes regression in wound condition, may D/C ordered dressing product/s and apply Normal Saline Moist Dressing daily until next Wound Healing Center / Other MD appointment. Notify Wound Healing Center of regression in wound condition at [phone]. o Please direct any NON-WOUND related issues/requests for orders to patient's Primary Care Physician Notes PATIENT SEES DR Vanna Scotland AT [facility] FOR FOLEY CATHETER CHANGES. PATIENT WOULD LIKE FOR HHRN  TO DO THIS WHILE UNDER SKILLED SERVICES. OFFICE PHONE [phone] Electronic Signature(s) Signed: 07/12/2018 4:25:13 PM By: Curtis Sites Signed: 07/12/2018 8:26:01 PM By: Lenda Kelp PA-C Entered By: Curtis Sites on 07/12/2018 12:02:31 Jessica [name] ([id number]) -------------------------------------------------------------------------------- Problem List Details [name]. Date of Service: 07/12/2018 11:00 AM Medical Record Number: [id number] Patient Account Number: [id number] Date of Birth/Sex: 1946-02-28 (74 y.o. F) Treating RN: Curtis Sites Primary Care Provider: Annita Brod Other Clinician: Referring Provider: Annita Brod Treating Provider/Extender: Linwood Dibbles, Jovita Persing Weeks in Treatment: 60 Active Problems ICD-10 Evaluated Encounter Code Description Active Date Today Diagnosis L89.154 Pressure ulcer of sacral region, stage 4 05/27/2017 No Yes M48.00 Spinal stenosis, site unspecified 05/27/2017 No Yes R54 Age-related physical debility 05/27/2017 No Yes G89.4 Chronic pain syndrome 05/27/2017 No Yes E78.2 Mixed hyperlipidemia 05/27/2017 No Yes L89.93 Pressure ulcer of unspecified site, stage 3 05/27/2017 No Yes Inactive Problems Resolved Problems Electronic Signature(s) Signed: 07/12/2018 8:26:01 PM By: Lenda Kelp PA-C Entered By: Lenda Kelp on 07/12/2018 10:51:37 Jessica [name] ([id number]) -------------------------------------------------------------------------------- Progress Note Details [name]. Date of Service: 07/12/2018 11:00 AM Medical Record Number: [id number] Patient Account Number: [id number] Date of Birth/Sex: [birth year] (73 y.o. F) Treating RN: Curtis Sites Primary Care Provider: Annita Brod Other Clinician: Referring Provider: Annita Brod Treating Provider/Extender: Linwood Dibbles, Nelline Lio Weeks in Treatment: 6 Subjective Chief Complaint Information obtained from Patient she is here for  evaluation of a sacral ulcer and bilateral lower extremity ulcers History of Present Illness (HPI) 05/27/17-she is here in initial evaluation for a left-sided sacral stage IV pressure ulcer and bilateral lower extremity, lateral aspect, unstageable pressure ulcers. She is accompanied by her husband and her son, who are her primary caregivers. She is bedbound  secondary to spinal stenosis. According to her son and husband she was hospitalized from 10/5-11/1 with healthcare associated pneumonia and altered mental status. During her hospitalization she was intubated, extubated on 10/27. She was discharged with Foley catheter and follow up with urology. According to her son and spouse she developed the sacral ulcer during hospitalization. Home health has been applying Santyl daily. She does have a low air loss mattress and is repositioned every 2-3 hours per her family's report. According to the son and spouse she had an appointment with urology on 12/18 and during that appointment developed discoloration to her bilateral lower extremities which ultimately developed into unstageable pressure ulcers to the lateral aspects of her bilateral lower extremities. There has been no topical treatment applied to these. She continues to have home health. There is no concerns expressed regarding dietary intake, stating she eats 3 meals a day, eating was provided; she is supplemented with boost with protein. 06/03/17-she is here in follow-up evaluation for sacral and bilateral lower extremity ulcers. Plain film x-ray done today reveals no distraction to the sacrum or coccyx, no visible abnormalities. Home health has ordered the negative pressure wound system but it has not been initiated. We will continue with Hydrofera Blue until initiation of negative pressure wound system and continue with Santyl to bilateral lower extremity ulcers. Follow-up next week 06/10/17-she is here in follow-up evaluation for sacral and  bilateral lower extremity ulcers. The wound VAC will be available tomorrow per home health. We will initiate wound VAC therapy to the sacral ulcer 3 times weekly (Thursday, Saturday/Sunday, Tuesday). We will continue with Santyl to the lower extremity ulcers. The patient's son is checking into home health therapy over the weekend for [facility] changes, with the understanding that if VAC changes cannot be performed over the weekend he will need to change his mother's appointments to Monday, Wednesday or Friday. The sacral ulcer clinically does not appear infected but there has been a change in the amount of drainage acutely, there is no significant amount of devitalized tissue, there is no malodor. Wound culture was obtained to evaluate for occult infection we will hold off on antibiotic therapy until sensitivities are resulted. 06/18/17 on evaluation today patient appears to be doing fairly well in my opinion although this is the first time I have seen this patient she has been previously evaluated by Tacey Ruiz here in our office. She is going to be switching to Fridays to see me due to the Wound VAC schedule being changed on Monday, Wednesday, and Friday. Subsequently she seems to be doing fairly well with the Wound VAC. Her son who was present during evaluation today states that he somewhat stresses of the Wound VAC in making sure that it was functioning appropriately. With that being said everything seems to be working well he knows what settings on the [facility] itself to look at and ensure that it is functioning properly. Overall the wound appears to be nice and clean there is no need for debridement today. She has no discomfort in her bilateral lower extremity ulcers also appear to be improving based on measurements and what this honest tell me about the overall appearance. 07/02/17 on evaluation today I noted in patients wound bed that was actually an odor that had not previously been noted. Subsequently there  was also a small area of bone that was not previously noted during my last evaluation. This did appear to be necrotic and was being somewhat forced out by the body around the  region of granulation. She does not have any pain which is good news. Nonetheless the overall appearance of the ulcer is making me concerned for the patient having developed osteomyelitis. Currently she is not been on any antibiotics and the Wound VAC has been doing fairly well in general. With that being said I do not think we need to continue the Wound VAC if she potentially has a bone infection. At least not until it is properly addressed with antibiotics. Jessica [name], STAILEY ([id number]) 07/09/17 on evaluation today patient appears to actually be doing very well in regard to her sacral ulcer compared to last week. There is actually no exposed bone at this point. 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 With that being said still I do believe she needs to have a referral to infectious disease due to the osteomyelitis I am gonna recommend that she continue with the doxycycline based on the culture results which showed Eikenella Corrodens as the organism identified that tetracycline should work for. She does not seem to be having any discomfort whatsoever at this point. 07/16/17 on evaluation today patient actually appears to be doing rather well in regard to her sacral wound. I think that the original wound site actually appears much better than previously noted. With that being said she does have a new superficial injury on the right sacral region which appears to be due to according to the sign transport that occurred for her MRI unfortunately. Fortunately this is not too deep and I do think it can be managed but it was a new area that was not previously noted.  Otherwise she has been tolerating the Dakin s soaked gauze packing without complication. 07/30/17 on evaluation today patient presents for reevaluation concerning her sacral wound. She has been tolerating the dressing changes without complication. With that being said her wound is doing so well at this point that I think she may be at the point where we could reinitiate the Wound VAC currently and hopefully see good results from this. I'm very pleased with how she has responded to the [facility] s soaked gauze packing. 08/13/17 evaluation today patient appears to be doing excellent in regard to her lower extremity ulcer this seems to be cleaning up very nicely. In regard to the sacral ulcer the area of trauma on the right lateral portion of the wound actually appears to be much better than previously noted during the last evaluation. The word has definitely filled in and the Wound VAC appears to be helpful I do believe. Overall I'm very pleased with how things seem to be progressing at this time. Patient likewise is also happy that things are doing well. 08/27/17 on evaluation today patient presents for follow-up concerning her ongoing issues with her sacral ulcer and lower extremity ulcer. Fortunately she has been tolerating the dressing changes well complication the Wound VAC in general seems to have done very well up to this point. She does not have any evidence of infection which is good news. She does have some dark discoloration in central portion of the wound which is troublesome for the possibility of pressure injury to the site it sounds like her prior air mattress was not functioning properly her son has just bought a new one for her this is doing much better. Otherwise things seem to be progressing nicely. 09/10/17 on  evaluation today patient presents for follow-up concerning her sacral wound and lower extremity ulcer. She has been tolerating the switch of her dressing changes to the Dakin s soaked  gauze dressing very well in regard to the sacrum. The left lateral lower extremity ulcer appears to have a new area open just distal to the one that we have been treating with Santyl and this is new since her last visit. There does not appear to be any evidence of infection which is good news. With that being said there's a lot of maceration here at the site and I feel this is likely due to the switch and the dressings it appears that due to the sticking of the dressings it will switch to utilizing a telfa pad over the Santyl. I think this caused a lot of drainage to be collected and situated in the region just under the dressing which has led to this causing which duration of breakdown. Overall there does not appear to be any significant pain which is good news. Of note I did actually have a conversation with the radiologist who is an interventional radiologist with [facility]. 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 With that being said there does not appear to be any rush to get this done according to the interventional radiologist whom I spoke with. Therefore after discussion it was determined that there gonna wait about six months prior to considering the procedure to get time for hopefully the sacral wound to heal in the interim to at least some degree. 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 According to nursing staff these are all improved, the negative pressure wound therapy system was put on hold last week. We will resume negative pressure wound therapy today, continue with Santyl to the lower extremity and she will follow-up next week. 09/24/17 on evaluation today unfortunately the patient's wound appears to be doing significantly worse compared to last week's evaluation and even my valuation the week before. She actually has bone noted on the left wound  margin in the region of undermining unfortunately. This was not noted during the last evaluation. I am concerned about the fact that this seems to be worse not better since our last visit with her. My biggest concern is that she's likely developing a worsening osteomyelitis. This was discussed with patient and her son today during the office visit. 05/03/17 on evaluation today patient actually appears to be doing rather well in regard to her sacral ulcer compared to last week's evaluation. She has been tolerating the dressing changes without complication. Fortunately even though we're not utilizing the Wound VAC she seems to be making some strides in seeing the wound area overall improved quite dramatically in my pinion in just one weeks time. She also seems to be staying off of this to the point that I do not see any evidence of new injury which is excellent news. Whatever she's been doing in that regard over the past week I would want her to continue. Jessica [name], Jessica [name] ([id number]) did also review the results of her bone culture which revealed group B strep as the responsible organism. Again this is what's causing her osteomyelitis she does have infectious disease appointment scheduled for 10/11/17 10/08/1898 valuation today patient appears to be doing better for the most part in regard to the sacral wound. Overall she is showing signs of  having granulation of the bone which is good news. There is an area towards the left of the wound where she does seem to be showing some signs of opening this it would be due to additional pressure to the area unfortunately. There does not appear to be any evidence of infection spreading which is good news that do not see any evidence of significant purulent discharge which is also good news. 10/26/17 on evaluation today patient sacral ulcer actually appears to be very healthy and doing well in regard to the overall appearance of the wound. With that being said  she does seem to be having some issues at this point in time with some shear/friction injury of the sacrum from where she was transported to [facility] for her infectious disease appointment. She has been placed on IV antibiotic therapy I will have to go back and find that note for review as I did not have access to that today. Nonetheless I will add the next visit be sure to document the exact antibiotics that she is taking at this point. Nonetheless I do believe the wound is doing better there's no bone exposure nothing that requires debridement in regard to the sacral wound. Likewise her left lateral lower extremity ulcer also appears to be doing significantly better. In general I feel like things are showing signs of improvement all around. 11/12/17 on evaluation today patient appears to be doing rather well in regard to her sacral wound. In general I do see signs of granulation at this point. Fortunately there does not appear to be any evidence of infection which is good news as well. In general overall happy with the appearance. With that being said I'll be see I would like for this to be progressing more rapidly as with the patient and her son but nonetheless at least things do not appear to be doing worse. Her lower extremity ulcer is doing significantly better. 11/26/17 on evaluation today patient appears to be doing a little better in regard to the sacral wound especially in the peripheral of the wound bed. She actually did have an abrasion on the right gluteal region that's completely resolved to this point. Her lower extremity also appears to be for the most part completely resolved. Overall the sacrum itself seems to be the one thing that is still open and given her trouble. No fevers, chills, nausea, or vomiting noted at this time. She actually has an appointment next week with infectious disease for recheck to see how things are doing. She maybe switch to oral medications at that  point. 12/09/17 on evaluation today patient actually appears to be doing much better in regard to her sacral wound. I do feel like she has made good progress at this time as far as healing is concerned. In fact the wound looks better to me today than it has in quite some time. She is on oral antibiotic therapy and no longer on the IV antibiotics. She did see infectious disease last week. 12/24/17 on evaluation today patient's wounds actually appear to be doing decently well in regard to the sacral wound in particular. When it comes to her lower extremity ulcers both appear to be completely healed at this point in the one area where she had lived the rash has completely resolved at this time. Overall very pleased with the progress that has been made. Nonetheless the patient seems to be doing well in general. She still does have quite a bit of healing to do in  regard to the sacral wound but I feel like she is making progress. 01/07/18 on evaluation today patient actually appears to be doing excellent today regard to her sacral wound. The granular quality in the base of the wound is excellent and shown signs of good improvement there's no evidence of contusion nor deep tissue injury and the periwound actually appears to be doing well. In general I'm very pleased with the overall appearance. 02/04/18 on evaluation today patient's wound actually appears to be doing decently well as far as the granulation is concerned. She has been tolerating the dressing changes without complication. Right now we are still using the Wound VAC. Nonetheless overall there apparently is some cater to the wound and this is concerning simply for the fact that she could be developing a soft tissue infection again. She's had this happen in the past and at that time responded very well to the antibiotics. Nonetheless I don't think I would likely put her on anything prophylactically but rather wait for the results of the culture to  return. 02/18/18 on evaluation today patient actually appears to be doing fairly well. She has been tolerating the dressing changes. And the Wound VAC seems to be doing well. Overall I'm very pleased with how things have improved over the past couple weeks since I last saw her. Fortunately there does not appear to be any evidence of overt infection at this time which is good news. She has been taking the antibiotics without complication. Specifically that is the Bactrim DS which was prescribed on 02/08/18. 03/18/18 on evaluation today patient's wound actually does have quite a bit of odor noted compared to previous. With that Jessica [name], Jessica [name]. ([id number]) being said the wound itself overall does not appear to be too significant or worse. There is no event noted in the wound bed which is good news. With that being said the patient has been worried as the home health nurse stated that she thought she saw bone in the base of the wound. Nonetheless there's a little bit of bruising along the left lateral border of the wound bed which again does not appear to be terrible we have noticed this before but other than this the majority the wound bed appears to be doing excellent in my pinion. I am wondering if there may be some bacterial colonization. This could be leading to some of the odor that we are noting with the Wound VAC. 04/01/18 on evaluation today patient appears to actually be doing well in regard to the overall appearance of the wound itself. She also does not have any odor at this point in regard to the wound surface and I do believe the Bacons is extremely helpful in this regard. With that being said she does have a little bit of deep tissue injury on the medial aspect of the wound. Again last time it was more lateral. The lateral seems to have improved quite well. Nonetheless she also notes by way of her son that this has been very macerated in the past several weeks since I last saw her.  Though things overall seem to be doing better I still think that the Wound VAC may help with managing her fluid better I'm wondering if we can attempt a gauze Wound VAC my hope is that this will be able to manage the moisture control as well as continuing with managing the odor of the wound and bacterial load since the Dakin s has done excellent in helping in that regard.  04/15/17 on evaluation today patient actually appears to be doing much better in regard to the overall appearance of her sacral wound. I do believe that the gauze Vac has been of benefit for her. Overall she is tolerating this without complication which is also good news. There is no sign of injury to the sacral region at this point. 04/29/18 on evaluation today patient appears to be doing well in regard to her sacral ulcer. She sure signs of good granulation and the Gauze VAC is doing very well for her. Overall I'm pleased in this regard. The biggest issue is that the time from Friday till Monday seems to be quite long for her as far as the amount of time to keep the Wound VAC sealed. For that reason her son wonders if he can take this off on Sunday morning in order to give her day of just wet to dry dressing's with the Dakin's solution I think this would be okay. 05/13/18 on evaluation today patient appears to be doing okay in regard to her sacral ulcer. There does not appear to be any evidence of overt infection at this time which is good news. In general the drainage and moisture appears to be significantly improved this was after the pressure on the Wound VAC was increased after they contacted our office. With that being said a lot of the dark tissue around the edge of the wound has improved in the maceration is significantly better this seems to be managing much better. Fortunately there is no signs of infection at this time. Overall the patient is doing quite well. The wound measurements are not significantly smaller today  although they were over the past two visits we will see how things go. 05/27/18 on evaluation today patient's sacral wound actually appears to be doing fairly well at this point. She does have a deep tissue injury yet again on the medial aspect of the wound which is something that we seem to be seeing often. There is some question as to whether or not this is actually being caused by the Wound VAC bunching up and then her somewhat laying on it even though they are attempting to offload this as best as possible. Patient son states that overall they do the best they can trying offload her and I definitely believe him. I may want to see about putting the Wound VAC on hold for a couple weeks and see if this happens just with standard dressing changes. 06/10/18 on evaluation today patient actually appears to be doing better in regard to her sacral wound. Overall there's no signs of infection, no odor, and no significant skin breakdown around the edge of the wound I feel like this is done much better 50 Dakin s soaked gauze dressing as opposed to legalizing anything such as the Wound VAC. I believe that we discontinue the Wound VAC as of today. 06/24/18 on evaluation today patient actually appears to be doing well in regard to her sacral ulcer. There's no signs of infection, no odor, and in my pinion no concerns at all. I feel like she is making good progress as far as I'm concerned. 07/12/18 on evaluation today patient appears to be doing very well in regard to her sacral ulcer. She has been tolerating the dressing changes without complication. Fortunately there does not appear to be any evidence of active infection at this time she is doing excellent as far as I'm concerned at this point. Patient History Information obtained from Patient.  Family History No family history of Cancer, Diabetes, Heart Disease. Social History Jessica [name], Jessica [name] ([id number]) Never smoker, Marital Status - Married. Review of  Systems (ROS) Constitutional Symptoms (General Health) Denies complaints or symptoms of Fever, Chills. Respiratory The patient has no complaints or symptoms. Cardiovascular The patient has no complaints or symptoms. Psychiatric The patient has no complaints or symptoms. Objective Constitutional Well-nourished and well-hydrated in no acute distress. Vitals Time Taken: 11:18 AM, Height: 70 in, Pulse: 72 bpm, Respiratory Rate: 16 breaths/min, Blood Pressure: 160/78 mmHg. Respiratory normal breathing without difficulty. clear to auscultation bilaterally. Cardiovascular regular rate and rhythm with normal S1, S2. Psychiatric this patient is able to make decisions and demonstrates good insight into disease process. Alert and Oriented x 3. pleasant and cooperative. General Notes: Patient's wound currently shows evidence of good granulation there's no evidence of infection no evidence of anything breaking down the worsening and definitely no bruising or deep tissue injury. Overall this is actually the best I've seen her wound in a long time and there is no bone exposure. Integumentary (Hair, Skin) Wound #1 status is Open. Original cause of wound was Pressure Injury. The wound is located on the Medial Sacrum. The wound measures 8cm length x 4.4cm width x 3cm depth; 27.646cm^2 area and 82.938cm^3 volume. There is muscle, Fat Layer (Subcutaneous Tissue) Exposed, and fascia exposed. There is no tunneling noted, however, there is undermining starting at 7:00 and ending at 1:00 with a maximum distance of 5.4cm. There is a medium amount of sanguinous drainage noted. Foul odor after cleansing was noted. The wound margin is distinct with the outline attached to the wound base. There is large (67-100%) red, pink, hyper - granulation within the wound bed. There is a small (1-33%) amount of necrotic tissue within the wound bed including Adherent Slough. The periwound skin appearance exhibited: Scarring,  Rubor, Erythema. The periwound skin appearance did not exhibit: Callus, Crepitus, Excoriation, Induration, Rash, Dry/Scaly, Maceration, Atrophie Blanche, Cyanosis, Ecchymosis, Hemosiderin Staining, Mottled, Pallor. The surrounding wound skin color is noted with erythema which is circumferential. Periwound temperature was noted as No Abnormality. The periwound has tenderness on palpation. Jessica [name], Jessica [name] ([id number]) Assessment Active Problems ICD-10 Pressure ulcer of sacral region, stage 4 Spinal stenosis, site unspecified Age-related physical debility Chronic pain syndrome Mixed hyperlipidemia Pressure ulcer of unspecified site, stage 3 Plan Wound Cleansing: Wound #1 Medial Sacrum: Other: - please cleanse sacral wound with dakins moistened gauze - do not spray dakins on wound Anesthetic (add to Medication List): Wound #1 Medial Sacrum: Topical Lidocaine 4% cream applied to wound bed prior to debridement (In Clinic Only). Skin Barriers/Peri-Wound Care: Wound #1 Medial Sacrum: Skin Prep Primary Wound Dressing: Wound #1 Medial Sacrum: Pack wound with: - Dakin's soaked gauze Secondary Dressing: Wound #1 Medial Sacrum: ABD pad - secure with tape Dressing Change Frequency: Wound #1 Medial Sacrum: Change dressing twice daily. Follow-up Appointments: Wound #1 Medial Sacrum: Return Appointment in 2 weeks. Off-Loading: Wound #1 Medial Sacrum: Turn and reposition every 2 hours Additional Orders / Instructions: Wound #1 Medial Sacrum: Increase protein intake. Home Health: Wound #1 Medial Sacrum: Initiate Home Health for Skilled Nursing - [facility] Nurse may visit PRN to address patient s wound care needs. FACE TO FACE ENCOUNTER: MEDICARE and MEDICAID PATIENTS: I certify that this patient is under my care and that I had a face-to-face encounter that meets the physician face-to-face encounter requirements with this patient on this date. The encounter with the patient  was in whole  or in part for the following MEDICAL CONDITION: (primary reason for Home Healthcare) MEDICAL NECESSITY: I certify, that based on my findings, NURSING services are a medically necessary home health service. HOME BOUND STATUS: I certify that my clinical findings support that this patient is homebound (i.e., Due to illness or injury, pt requires aid of supportive devices such as crutches, cane, wheelchairs, walkers, the use of special [place], [place]. ([id number]) transportation or the assistance of another person to leave their place of residence. There is a normal inability to leave the home and doing so requires considerable and taxing effort. Other absences are for medical reasons / religious services and are infrequent or of short duration when for other reasons). If current dressing causes regression in wound condition, may D/C ordered dressing product/s and apply Normal Saline Moist Dressing daily until next Wound Healing Center / Other MD appointment. Notify Wound Healing Center of regression in wound condition at [phone]. Please direct any NON-WOUND related issues/requests for orders to patient's Primary Care Physician General Notes: PATIENT SEES DR Vanna Scotland AT [facility]. PATIENT WOULD LIKE FOR HHRN TO DO THIS WHILE UNDER SKILLED SERVICES. OFFICE PHONE [phone] Upon inspection patient seems to be doing very well with the Current wound care measures and recommend that we continue with such. She's in agreement with plan as is her son. We will subsequently see were things stand at follow-up. If anything changes worsens meantime she will contact the office and let me know. Otherwise will see you where everything is upon reevaluation. Please see above for specific wound care orders. We will see patient for re-evaluation in two week(s) here in the clinic. If anything worsens or changes patient will contact our office for additional  recommendations. Electronic Signature(s) Signed: 07/12/2018 8:26:01 PM By: Lenda Kelp PA-C Entered By: Lenda Kelp on 07/12/2018 13:42:16 Jessica [name] ([id number]) -------------------------------------------------------------------------------- ROS/PFSH Details [name]. Date of Service: 07/12/2018 11:00 AM Medical Record Number: [id number] Patient Account Number: [id number] Date of Birth/Sex: [birth year] (73 y.o. F) Treating RN: Curtis Sites Primary Care Provider: Annita Brod Other Clinician: Referring Provider: Annita Brod Treating Provider/Extender: STONE III, Alyssabeth Bruster Weeks in Treatment: 21 Information Obtained From Patient Constitutional Symptoms (General Health) Complaints and Symptoms: Negative for: Fever; Chills Respiratory Complaints and Symptoms: No Complaints or Symptoms Cardiovascular Complaints and Symptoms: No Complaints or Symptoms Psychiatric Complaints and Symptoms: No Complaints or Symptoms Immunizations Pneumococcal Vaccine: Received Pneumococcal Vaccination: No Implantable Devices No devices added Family and Social History Cancer: No; Diabetes: No; Heart Disease: No; Never smoker; Marital Status - Married Physician Affirmation I have reviewed and agree with the above information. Electronic Signature(s) Signed: 07/12/2018 4:25:13 PM By: Curtis Sites Signed: 07/12/2018 8:26:01 PM By: Lenda Kelp PA-C Entered By: Lenda Kelp on 07/12/2018 13:40:05 Jessica [name] ([id number]) -------------------------------------------------------------------------------- SuperBill Details [name]. Date of Service: 07/12/2018 Medical Record Number: [id number] Patient Account Number: [id number] Date of Birth/Sex: Oct 14, 1945 (73 y.o. F) Treating RN: Curtis Sites Primary Care Provider: Annita Brod Other Clinician: Referring Provider: Annita Brod Treating Provider/Extender: STONE III,  Daianna Vasques Weeks in Treatment: 58 Diagnosis Coding ICD-10 Codes Code Description L89.154 Pressure ulcer of sacral region, stage 4 M48.00 Spinal stenosis, site unspecified R54 Age-related physical debility G89.4 Chronic pain syndrome E78.2 Mixed hyperlipidemia L89.93 Pressure ulcer of unspecified site, stage 3 Facility Procedures CPT4 Code: [id number] Description: 99213 - WOUND CARE VISIT-LEV 3 EST PT Modifier: Quantity: 1 Physician Procedures CPT4 Code:  1610960 Description: 99214 - WC PHYS LEVEL 4 - EST PT ICD-10 Diagnosis Description L89.154 Pressure ulcer of sacral region, stage 4 M48.00 Spinal stenosis, site unspecified R54 Age-related physical debility G89.4 Chronic pain syndrome Modifier: Quantity: 1 Electronic Signature(s) Signed: 07/12/2018 8:26:01 PM By: Lenda Kelp PA-C Entered By: Lenda Kelp on 07/12/2018 13:44:54

## 2018-07-13 NOTE — Progress Notes (Signed)
Jessica BradyOVERMAN, Nastassja W. ([id number]) Visit Report for 07/12/2018 Arrival Information Details Patient Name: Jessica BradyOVERMAN, Aaliyha W. Date of Service: 07/12/2018 11:00 AM Medical Record Number: [id number] Patient Account Number: [id number] Date of Birth/Sex: 06-02-1945 6(73 y.o. F) Treating RN: Huel CoventryWoody, Kim Primary Care Bernerd Terhune: Annita BrodASENSO, PHILIP Other Clinician: Referring Ahlayah Tarkowski: Annita BrodASENSO, PHILIP Treating Rastus Borton/Extender: Linwood DibblesSTONE III, HOYT Weeks in Treatment: 58 Visit Information History Since Last Visit Added or deleted any medications: No Patient Arrived: Stretcher Any new allergies or adverse reactions: No Arrival Time: 11:20 Had a fall or experienced change in No Accompanied By: son activities of daily living that may affect Transfer Assistance: Stretcher risk of falls: Patient Identification Verified: Yes Signs or symptoms of abuse/neglect since last visito No Secondary Verification Process Yes Hospitalized since last visit: No Completed: Implantable device outside of the clinic excluding No Patient Has Alerts: Yes cellular tissue based products placed in the center Patient Alerts: ALLERGIC TO since last visit: ZINC Has Dressing in Place as Prescribed: Yes Pain Present Now: No Electronic Signature(s) Signed: 07/12/2018 4:26:52 PM By: Elliot GurneyWoody, BSN, RN, CWS, Kim RN, BSN Entered By: Elliot GurneyWoody, BSN, RN, CWS, Kim on 07/12/2018 11:21:06 Jessica BradyVERMAN, Tanish W. ([id number]) -------------------------------------------------------------------------------- Clinic Level of Care Assessment Details Patient Name: Jessica BradyVERMAN, Karcyn W. Date of Service: 07/12/2018 11:00 AM Medical Record Number: [id number] Patient Account Number: [id number] Date of Birth/Sex: 06-02-1945 [age] y.o. F) Treating RN: Curtis Sitesorthy, Joanna Primary Care Chevon Fomby: Annita BrodASENSO, PHILIP Other Clinician: Referring Lineth Thielke: Annita BrodASENSO, PHILIP Treating Donae Kueker/Extender: STONE III, HOYT Weeks in Treatment: 58 Clinic Level of Care Assessment Items TOOL 4  Quantity Score []  - Use when only an EandM is performed on FOLLOW-UP visit 0 ASSESSMENTS - Nursing Assessment / Reassessment X - Reassessment of Co-morbidities (includes updates in patient status) 1 10 X- 1 5 Reassessment of Adherence to Treatment Plan ASSESSMENTS - Wound and Skin Assessment / Reassessment X - Simple Wound Assessment / Reassessment - one wound 1 5 []  - 0 Complex Wound Assessment / Reassessment - multiple wounds []  - 0 Dermatologic / Skin Assessment (not related to wound area) ASSESSMENTS - Focused Assessment []  - Circumferential Edema Measurements - multi extremities 0 []  - 0 Nutritional Assessment / Counseling / Intervention []  - 0 Lower Extremity Assessment (monofilament, tuning fork, pulses) []  - 0 Peripheral Arterial Disease Assessment (using hand held doppler) ASSESSMENTS - Ostomy and/or Continence Assessment and Care []  - Incontinence Assessment and Management 0 []  - 0 Ostomy Care Assessment and Management (repouching, etc.) PROCESS - Coordination of Care X - Simple Patient / Family Education for ongoing care 1 15 []  - 0 Complex (extensive) Patient / Family Education for ongoing care X- 1 10 Staff obtains ChiropractorConsents, Records, Test Results / Process Orders []  - 0 Staff telephones HHA, Nursing Homes / Clarify orders / etc []  - 0 Routine Transfer to another Facility (non-emergent condition) []  - 0 Routine Hospital Admission (non-emergent condition) []  - 0 New Admissions / Manufacturing engineernsurance Authorizations / Ordering NPWT, Apligraf, etc. []  - 0 Emergency Hospital Admission (emergent condition) X- 1 10 Simple Discharge Coordination Jessica BradyOVERMAN, Mykhia W. ([id number]) []  - 0 Complex (extensive) Discharge Coordination PROCESS - Special Needs []  - Pediatric / Minor Patient Management 0 []  - 0 Isolation Patient Management []  - 0 Hearing / Language / Visual special needs []  - 0 Assessment of Community assistance (transportation, D/C planning, etc.) []  -  0 Additional assistance / Altered mentation []  - 0 Support Surface(s) Assessment (bed, cushion, seat, etc.) INTERVENTIONS - Wound Cleansing / Measurement X - Simple Wound Cleansing - one  wound 1 5 []  - 0 Complex Wound Cleansing - multiple wounds X- 1 5 Wound Imaging (photographs - any number of wounds) []  - 0 Wound Tracing (instead of photographs) X- 1 5 Simple Wound Measurement - one wound []  - 0 Complex Wound Measurement - multiple wounds INTERVENTIONS - Wound Dressings X - Small Wound Dressing one or multiple wounds 1 10 []  - 0 Medium Wound Dressing one or multiple wounds []  - 0 Large Wound Dressing one or multiple wounds []  - 0 Application of Medications - topical []  - 0 Application of Medications - injection INTERVENTIONS - Miscellaneous []  - External ear exam 0 []  - 0 Specimen Collection (cultures, biopsies, blood, body fluids, etc.) []  - 0 Specimen(s) / Culture(s) sent or taken to Lab for analysis []  - 0 Patient Transfer (multiple staff / Nurse, adult / Similar devices) []  - 0 Simple Staple / Suture removal (25 or less) []  - 0 Complex Staple / Suture removal (26 or more) []  - 0 Hypo / Hyperglycemic Management (close monitor of Blood Glucose) []  - 0 Ankle / Brachial Index (ABI) - do not check if billed separately X- 1 5 Vital Signs Mcquarrie, Dim W. ([id number]) Has the patient been seen at the hospital within the last three years: Yes Total Score: 85 Level Of Care: New/Established - Level 3 Electronic Signature(s) Signed: 07/12/2018 4:25:13 PM By: Curtis Sites Entered By: Curtis Sites on 07/12/2018 13:38:49 Jessica [name] ([id number]) -------------------------------------------------------------------------------- Encounter Discharge Information Details [name]. Date of Service: 07/12/2018 11:00 AM Medical Record Number: [id number] Patient Account Number: [id number] Date of Birth/Sex: Jun 05, 1945 (73 y.o. F) Treating RN:  Curtis Sites Primary Care Yaziel [name]: Annita Brod Other Clinician: Referring Lauree Yurick: Annita Brod Treating Tahsin Benyo/Extender: Linwood Dibbles, HOYT Weeks in Treatment: 35 Encounter Discharge Information Items Discharge Condition: Stable Ambulatory Status: Stretcher Discharge Destination: Home Transportation: Private Auto Accompanied By: EMS and son Schedule Follow-up Appointment: Yes Clinical Summary of Care: Electronic Signature(s) Signed: 07/12/2018 1:39:48 PM By: Curtis Sites Entered By: Curtis Sites on 07/12/2018 13:39:48 Jessica [name] ([id number]) -------------------------------------------------------------------------------- Lower Extremity Assessment Details [name]. Date of Service: 07/12/2018 11:00 AM Medical Record Number: [id number] Patient Account Number: [id number] Date of Birth/Sex: [birth year] (73 y.o. F) Treating RN: Huel Coventry Primary Care Nija Koopman: Annita Brod Other Clinician: Referring Yussef Jorge: Annita Brod Treating Alexios Keown/Extender: Lenda Kelp Weeks in Treatment: 58 Electronic Signature(s) Signed: 07/12/2018 4:26:52 PM By: Elliot Gurney, BSN, RN, CWS, Kim RN, BSN Entered By: Elliot Gurney, BSN, RN, CWS, Kim on 07/12/2018 11:30:30 Jessica [name] ([id number]) -------------------------------------------------------------------------------- Multi Wound Chart Details [name]. Date of Service: 07/12/2018 11:00 AM Medical Record Number: [id number] Patient Account Number: [id number] Date of Birth/Sex: 1945/09/09 (73 y.o. F) Treating RN: Curtis Sites Primary Care Cherish Runde: Annita Brod Other Clinician: Referring Chesni Vos: Annita Brod Treating Riya Huxford/Extender: STONE III, HOYT Weeks in Treatment: 58 Vital Signs Height(in): 70 Pulse(bpm): 72 Weight(lbs): Blood Pressure(mmHg): 160/78 Body Mass Index(BMI): Temperature(F): Respiratory Rate 16 (breaths/min): Photos: [N/A:N/A] Wound Location:  Sacrum - Medial N/A N/A Wounding Event: Pressure Injury N/A N/A Primary Etiology: Pressure Ulcer N/A N/A Date Acquired: 01/15/2017 N/A N/A Weeks of Treatment: 58 N/A N/A Wound Status: Open N/A N/A Measurements L x W x D 8x4.4x3 N/A N/A (cm) Area (cm) : 27.646 N/A N/A Volume (cm) : 82.938 N/A N/A % Reduction in Area: 25.00% N/A N/A % Reduction in Volume: -125.10% N/A N/A Starting Position 1 7 (o'clock): Ending Position 1 1 (o'clock): Maximum Distance 1 (cm): 5.4 Undermining: Yes  N/A N/A Classification: Category/Stage IV N/A N/A Exudate Amount: Medium N/A N/A Exudate Type: Sanguinous N/A N/A Exudate Color: red N/A N/A Foul Odor After Cleansing: Yes N/A N/A Odor Anticipated Due to No N/A N/A Product Use: Wound Margin: Distinct, outline attached N/A N/A Granulation Amount: Large (67-100%) N/A N/A Granulation Quality: Red, Pink, Hyper-granulation N/A N/A Necrotic Amount: Small (1-33%) N/A N/A RAYONNA, KRIEL. ([id number]) Exposed Structures: Fascia: Yes N/A N/A Fat Layer (Subcutaneous Tissue) Exposed: Yes Muscle: Yes Tendon: No Joint: No Bone: No Epithelialization: None N/A N/A Periwound Skin Texture: Scarring: Yes N/A N/A Excoriation: No Induration: No Callus: No Crepitus: No Rash: No Periwound Skin Moisture: Maceration: No N/A N/A Dry/Scaly: No Periwound Skin Color: Erythema: Yes N/A N/A Rubor: Yes Atrophie Blanche: No Cyanosis: No Ecchymosis: No Hemosiderin Staining: No Mottled: No Pallor: No Erythema Location: Circumferential N/A N/A Temperature: No Abnormality N/A N/A Tenderness on Palpation: Yes N/A N/A Wound Preparation: Ulcer Cleansing: N/A N/A Rinsed/Irrigated with Saline Topical Anesthetic Applied: Other: lidocaine 4% Treatment Notes Electronic Signature(s) Signed: 07/12/2018 4:25:13 PM By: Curtis Sites Entered By: Curtis Sites on 07/12/2018 11:51:29 Jessica [name]  ([id number]) -------------------------------------------------------------------------------- Multi-Disciplinary Care Plan Details [name]. Date of Service: 07/12/2018 11:00 AM Medical Record Number: [id number] Patient Account Number: [id number] Date of Birth/Sex: 08/10/1945 (73 y.o. F) Treating RN: Curtis Sites Primary Care Abdulmalik Darco: Annita Brod Other Clinician: Referring Chriss Mannan: Annita Brod Treating Lorell Thibodaux/Extender: STONE III, HOYT Weeks in Treatment: 51 Active Inactive Orientation to the Wound Care Program Nursing Diagnoses: Knowledge deficit related to the wound healing center program Goals: Patient/caregiver will verbalize understanding of the Wound Healing Center Program Date Initiated: 06/03/2017 Target Resolution Date: 06/17/2017 Goal Status: Active Interventions: Provide education on orientation to the wound center Notes: Pressure Nursing Diagnoses: Knowledge deficit related to causes and risk factors for pressure ulcer development Knowledge deficit related to management of pressures ulcers Potential for impaired tissue integrity related to pressure, friction, moisture, and shear Goals: Patient will remain free from development of additional pressure ulcers Date Initiated: 06/03/2017 Target Resolution Date: 06/17/2017 Goal Status: Active Patient will remain free of pressure ulcers Date Initiated: 06/03/2017 Target Resolution Date: 06/17/2017 Goal Status: Active Patient/caregiver will verbalize risk factors for pressure ulcer development Date Initiated: 06/03/2017 Target Resolution Date: 06/17/2017 Goal Status: Active Interventions: Assess: immobility, friction, shearing, incontinence upon admission and as needed Assess potential for pressure ulcer upon admission and as needed Notes: Wound/Skin Impairment ESLIE, EBBS ([id number]) Nursing Diagnoses: Impaired tissue integrity Goals: Patient/caregiver will verbalize understanding of  skin care regimen Date Initiated: 06/03/2017 Target Resolution Date: 06/17/2017 Goal Status: Active Ulcer/skin breakdown will have a volume reduction of 30% by week 4 Date Initiated: 06/03/2017 Target Resolution Date: 06/17/2017 Goal Status: Active Interventions: Assess ulceration(s) every visit Treatment Activities: Patient referred to home care : 06/03/2017 Skin care regimen initiated : 06/03/2017 Notes: Electronic Signature(s) Signed: 07/12/2018 4:25:13 PM By: Curtis Sites Entered By: Curtis Sites on 07/12/2018 11:51:19 Jessica [name] ([id number]) -------------------------------------------------------------------------------- Pain Assessment Details [name]. Date of Service: 07/12/2018 11:00 AM Medical Record Number: [id number] Patient Account Number: [id number] Date of Birth/Sex: [birth year] (73 y.o. F) Treating RN: Huel Coventry Primary Care Fielding Mault: Annita Brod Other Clinician: Referring Lyrika Souders: Annita Brod Treating Zae Kirtz/Extender: Linwood Dibbles, HOYT Weeks in Treatment: 39 Active Problems Location of Pain Severity and Description of Pain Patient Has Paino No Site Locations Pain Management and Medication Current Pain Management: Notes Patient denies any pain at this time. Electronic Signature(s) Signed: 07/12/2018 4:26:52 PM By:  Elliot Gurney, BSN, RN, CWS, Radio producer, BSN Entered By: Elliot Gurney, BSN, RN, CWS, Kim on 07/12/2018 11:21:30 Jessica [name] ([id number]) -------------------------------------------------------------------------------- Patient/Caregiver Education Details [name]. Date of Service: 07/12/2018 11:00 AM Medical Record Number: [id number] Patient Account Number: [id number] Date of Birth/Gender: [birth year] (73 y.o. F) Treating RN: Curtis Sites Primary Care Physician: Annita Brod Other Clinician: Referring Physician: Annita Brod Treating Physician/Extender: Linwood Dibbles, HOYT Weeks in Treatment:  4 Education Assessment Education Provided To: Patient and Caregiver Education Topics Provided Wound/Skin Impairment: Handouts: Other: wound care as ordered Methods: Demonstration, Explain/Verbal Responses: State content correctly Electronic Signature(s) Signed: 07/12/2018 4:25:13 PM By: Curtis Sites Entered By: Curtis Sites on 07/12/2018 13:39:11 Jessica [name] ([id number]) -------------------------------------------------------------------------------- Wound Assessment Details [name]. Date of Service: 07/12/2018 11:00 AM Medical Record Number: [id number] Patient Account Number: [id number] Date of Birth/Sex: 06-23-1945 (73 y.o. F) Treating RN: Huel Coventry Primary Care Carinne Brandenburger: Annita Brod Other Clinician: Referring Idalis Hoelting: Annita Brod Treating Guadalupe Kerekes/Extender: STONE III, HOYT Weeks in Treatment: 58 Wound Status Wound Number: 1 Primary Etiology: Pressure Ulcer Wound Location: Sacrum - Medial Wound Status: Open Wounding Event: Pressure Injury Date Acquired: 01/15/2017 Weeks Of Treatment: 58 Clustered Wound: No Photos Wound Measurements Length: (cm) 8 % Reductio Width: (cm) 4.4 % Reductio Depth: (cm) 3 Epithelial Area: (cm) 27.646 Tunneling Volume: (cm) 82.938 Undermini Startin Ending Maximum n in Area: 25% n in Volume: -125.1% ization: None : No ng: Yes g Position (o'clock): 7 Position (o'clock): 1 Distance: (cm) 5.4 Wound Description Classification: Category/Stage IV Foul Odor Wound Margin: Distinct, outline attached Due to Pro Exudate Amount: Medium Slough/Fib Exudate Type: Sanguinous Exudate Color: red After Cleansing: Yes duct Use: No rino Yes Wound Bed Granulation Amount: Large (67-100%) Exposed Structure Granulation Quality: Red, Pink, Hyper-granulation Fascia Exposed: Yes Necrotic Amount: Small (1-33%) Fat Layer (Subcutaneous Tissue) Exposed: Yes Necrotic Quality: Adherent Slough Tendon Exposed:  No Muscle Exposed: Yes Necrosis of Muscle: No SHELA, ESSES. ([id number]) Joint Exposed: No Bone Exposed: No Periwound Skin Texture Texture Color No Abnormalities Noted: No No Abnormalities Noted: No Callus: No Atrophie Blanche: No Crepitus: No Cyanosis: No Excoriation: No Ecchymosis: No Induration: No Erythema: Yes Rash: No Erythema Location: Circumferential Scarring: Yes Hemosiderin Staining: No Mottled: No Moisture Pallor: No No Abnormalities Noted: No Rubor: Yes Dry / Scaly: No Maceration: No Temperature / Pain Temperature: No Abnormality Tenderness on Palpation: Yes Wound Preparation Ulcer Cleansing: Rinsed/Irrigated with Saline Topical Anesthetic Applied: Other: lidocaine 4%, Treatment Notes Wound #1 (Medial Sacrum) Notes Dakins soaked gauze packed into wound, ABD secured with tape Electronic Signature(s) Signed: 07/12/2018 4:26:52 PM By: Elliot Gurney, BSN, RN, CWS, Kim RN, BSN Entered By: Elliot Gurney, BSN, RN, CWS, Kim on 07/12/2018 11:30:17 Jessica [name] ([id number]) -------------------------------------------------------------------------------- Vitals Details [name]. Date of Service: 07/12/2018 11:00 AM Medical Record Number: [id number] Patient Account Number: [id number] Date of Birth/Sex: [birth year] (73 y.o. F) Treating RN: Huel Coventry Primary Care Rasool Rommel: Annita Brod Other Clinician: Referring Raidyn Wassink: Annita Brod Treating Tenea Sens/Extender: STONE III, HOYT Weeks in Treatment: 58 Vital Signs Time Taken: 11:18 Pulse (bpm): 72 Height (in): 70 Respiratory Rate (breaths/min): 16 Blood Pressure (mmHg): 160/78 Reference Range: 80 - 120 mg / dl Electronic Signature(s) Signed: 07/12/2018 4:26:52 PM By: Elliot Gurney, BSN, RN, CWS, Kim RN, BSN Entered By: Elliot Gurney, BSN, RN, CWS, Kim on 07/12/2018 11:21:54

## 2018-07-26 ENCOUNTER — Ambulatory Visit: Payer: Self-pay | Admitting: Physician Assistant

## 2018-07-27 ENCOUNTER — Ambulatory Visit: Payer: Self-pay | Admitting: Urology

## 2018-08-02 ENCOUNTER — Ambulatory Visit: Payer: Self-pay | Admitting: Physician Assistant

## 2018-08-11 ENCOUNTER — Encounter: Payer: Medicare Other | Attending: Physician Assistant | Admitting: Physician Assistant

## 2018-08-11 ENCOUNTER — Other Ambulatory Visit: Payer: Self-pay

## 2018-08-11 DIAGNOSIS — L8993 Pressure ulcer of unspecified site, stage 3: Secondary | ICD-10-CM | POA: Insufficient documentation

## 2018-08-11 DIAGNOSIS — R54 Age-related physical debility: Secondary | ICD-10-CM | POA: Diagnosis not present

## 2018-08-11 DIAGNOSIS — E782 Mixed hyperlipidemia: Secondary | ICD-10-CM | POA: Diagnosis not present

## 2018-08-11 DIAGNOSIS — L89154 Pressure ulcer of sacral region, stage 4: Secondary | ICD-10-CM | POA: Diagnosis present

## 2018-08-11 DIAGNOSIS — G894 Chronic pain syndrome: Secondary | ICD-10-CM | POA: Insufficient documentation

## 2018-08-11 NOTE — Progress Notes (Signed)
Jessica, [name] ([id number]) Visit Report for 08/11/2018 Arrival Information Details [name], Jessica [name]. Date of Service: 08/11/2018 12:30 PM Medical Record Number: [id number] Patient Account Number: [id number] Date of Birth/Sex: 11-30-1945 (73 y.o. F) Treating RN: Arnette Norris Primary Care Kais Monje: Annita Brod Other Clinician: Referring Fahd Galea: Annita Brod Treating Tryone Kille/Extender: Linwood Dibbles, HOYT Weeks in Treatment: 63 Visit Information History Since Last Visit Added or deleted any medications: No Patient Arrived: Wheel Chair Any new allergies or adverse reactions: No Arrival Time: 12:40 Had a fall or experienced change in No Accompanied By: son activities of daily living that may affect Transfer Assistance: Stretcher risk of falls: Patient Identification Verified: Yes Signs or symptoms of abuse/neglect since last visito No Secondary Verification Process Yes Hospitalized since last visit: No Completed: Has Dressing in Place as Prescribed: Yes Patient Has Alerts: Yes Pain Present Now: No Patient Alerts: ALLERGIC TO ZINC Electronic Signature(s) Signed: 08/11/2018 3:16:51 PM By: Arnette Norris Entered By: Arnette Norris on 08/11/2018 12:41:19 Jessica [name] ([id number]) -------------------------------------------------------------------------------- Clinic Level of Care Assessment Details [name]. Date of Service: 08/11/2018 12:30 PM Medical Record Number: [id number] Patient Account Number: [id number] Date of Birth/Sex: Oct 19, 1945 (73 y.o. F) Treating RN: Arnette Norris Primary Care Ryley Bachtel: Annita Brod Other Clinician: Referring Miley Lindon: Annita Brod Treating Casimiro Lienhard/Extender: Linwood Dibbles, HOYT Weeks in Treatment: 63 Clinic Level of Care Assessment Items TOOL 4 Quantity Score  - Use when only an EandM is performed on FOLLOW-UP visit 0 ASSESSMENTS - Nursing Assessment / Reassessment X - Reassessment of  Co-morbidities (includes updates in patient status) 1 10 X- 1 5 Reassessment of Adherence to Treatment Plan ASSESSMENTS - Wound and Skin Assessment / Reassessment X - Simple Wound Assessment / Reassessment - one wound 1 5  - 0 Complex Wound Assessment / Reassessment - multiple wounds  - 0 Dermatologic / Skin Assessment (not related to wound area) ASSESSMENTS - Focused Assessment  - Circumferential Edema Measurements - multi extremities 0  - 0 Nutritional Assessment / Counseling / Intervention  - 0 Lower Extremity Assessment (monofilament, tuning fork, pulses)  - 0 Peripheral Arterial Disease Assessment (using hand held doppler) ASSESSMENTS - Ostomy and/or Continence Assessment and Care  - Incontinence Assessment and Management 0  - 0 Ostomy Care Assessment and Management (repouching, etc.) PROCESS - Coordination of Care X - Simple Patient / Family Education for ongoing care 1 15  - 0 Complex (extensive) Patient / Family Education for ongoing care  - 0 Staff obtains Chiropractor, Records, Test Results / Process Orders X- 1 10 Staff telephones HHA, Nursing Homes / Clarify orders / etc X- 1 10 Routine Transfer to another Facility (non-emergent condition)  - 0 Routine Hospital Admission (non-emergent condition)  - 0 New Admissions / Manufacturing engineer / Ordering NPWT, Apligraf, etc.  - 0 Emergency Hospital Admission (emergent condition) X- 1 10 Simple Discharge Coordination LAMA, NARAYANAN. ([id number])  - 0 Complex (extensive) Discharge Coordination PROCESS - Special Needs  - Pediatric / Minor Patient Management 0  - 0 Isolation Patient Management  - 0 Hearing / Language / Visual special needs  - 0 Assessment of Community assistance (transportation, D/C planning, etc.)  - 0 Additional assistance / Altered mentation  - 0 Support Surface(s) Assessment (bed, cushion, seat, etc.) INTERVENTIONS - Wound Cleansing /  Measurement X - Simple Wound Cleansing - one wound 1 5  - 0 Complex Wound Cleansing - multiple wounds X- 1 5 Wound Imaging (photographs - any number of wounds)  - 0  Wound Tracing (instead of photographs) X- 1 5 Simple Wound Measurement - one wound  - 0 Complex Wound Measurement - multiple wounds INTERVENTIONS - Wound Dressings X - Small Wound Dressing one or multiple wounds 1 10  - 0 Medium Wound Dressing one or multiple wounds  - 0 Large Wound Dressing one or multiple wounds  - 0 Application of Medications - topical  - 0 Application of Medications - injection INTERVENTIONS - Miscellaneous  - External ear exam 0  - 0 Specimen Collection (cultures, biopsies, blood, body fluids, etc.)  - 0 Specimen(s) / Culture(s) sent or taken to Lab for analysis  - 0 Patient Transfer (multiple staff / Nurse, adult / Similar devices)  - 0 Simple Staple / Suture removal (25 or less)  - 0 Complex Staple / Suture removal (26 or more)  - 0 Hypo / Hyperglycemic Management (close monitor of Blood Glucose)  - 0 Ankle / Brachial Index (ABI) - do not check if billed separately X- 1 5 Vital Signs Ilg, Desirae W. ([id number]) Has the patient been seen at the hospital within the last three years: Yes Total Score: 95 Level Of Care: New/Established - Level 3 Electronic Signature(s) Signed: 08/11/2018 3:16:51 PM By: Arnette Norris Entered By: Arnette Norris on 08/11/2018 13:04:41 Jessica [name] ([id number]) -------------------------------------------------------------------------------- Encounter Discharge Information Details [name]. Date of Service: 08/11/2018 12:30 PM Medical Record Number: [id number] Patient Account Number: [id number] Date of Birth/Sex: 02-07-1946 (73 y.o. F) Treating RN: Arnette Norris Primary Care Renell Allum: Annita Brod Other Clinician: Referring Cort Dragoo: Annita Brod Treating Braylee Bosher/Extender: Linwood Dibbles,  HOYT Weeks in Treatment: 32 Encounter Discharge Information Items Discharge Condition: Stable Ambulatory Status: Stretcher Discharge Destination: Home Transportation: Ambulance Accompanied By: son Schedule Follow-up Appointment: Yes Clinical Summary of Care: Electronic Signature(s) Signed: 08/11/2018 3:16:51 PM By: Arnette Norris Entered By: Arnette Norris on 08/11/2018 13:16:19 Jessica [name] ([id number]) -------------------------------------------------------------------------------- Lower Extremity Assessment Details [name]. Date of Service: 08/11/2018 12:30 PM Medical Record Number: [id number] Patient Account Number: [id number] Date of Birth/Sex: [birth year] (73 y.o. F) Treating RN: Arnette Norris Primary Care Napolean Sia: Annita Brod Other Clinician: Referring Andrena Margerum: Annita Brod Treating Gualberto Wahlen/Extender: Linwood Dibbles, HOYT Weeks in Treatment: 63 Electronic Signature(s) Signed: 08/11/2018 3:16:51 PM By: Arnette Norris Entered By: Arnette Norris on 08/11/2018 12:54:03 Jessica [name] ([id number]) -------------------------------------------------------------------------------- Multi Wound Chart Details [name]. Date of Service: 08/11/2018 12:30 PM Medical Record Number: [id number] Patient Account Number: [id number] Date of Birth/Sex: [birth year] (73 y.o. F) Treating RN: Arnette Norris Primary Care Brieanna Nau: Annita Brod Other Clinician: Referring Gill Delrossi: Annita Brod Treating Madyx Delfin/Extender: STONE III, HOYT Weeks in Treatment: 63 Vital Signs Height(in): 70 Pulse(bpm): 69 Weight(lbs): Blood Pressure(mmHg): 129/61 Body Mass Index(BMI): Temperature(F): 98.1 Respiratory Rate 18 (breaths/min): Photos: [N/A:N/A] Wound Location: Sacrum - Medial N/A N/A Wounding Event: Pressure Injury N/A N/A Primary Etiology: Pressure Ulcer N/A N/A Date Acquired: 01/15/2017 N/A N/A Weeks of Treatment: 5 N/A  N/A Wound Status: Open N/A N/A Measurements L x W x D 7.4x4.5x3 N/A N/A (cm) Area (cm) : 26.154 N/A N/A Volume (cm) : 78.461 N/A N/A % Reduction in Area: 29.00% N/A N/A % Reduction in Volume: -112.90% N/A N/A Starting Position 1 7 (o'clock): Ending Position 1 1 (o'clock): Maximum Distance 1 (cm): 4 Undermining: Yes N/A N/A Classification: Category/Stage IV N/A N/A Exudate Amount: Medium N/A N/A Exudate Type: Sanguinous N/A N/A Exudate Color: red N/A N/A Foul Odor After Cleansing: Yes N/A N/A Odor Anticipated Due to No N/A N/A  Product Use: Wound Margin: Distinct, outline attached N/A N/A Granulation Amount: Large (67-100%) N/A N/A Granulation Quality: Red, Pink, Hyper-granulation N/A N/A Necrotic Amount: Small (1-33%) N/A N/A CHRISHELLE, FEST. ([id number]) Exposed Structures: Fascia: Yes N/A N/A Fat Layer (Subcutaneous Tissue) Exposed: Yes Muscle: Yes Tendon: No Joint: No Bone: No Epithelialization: None N/A N/A Periwound Skin Texture: Scarring: Yes N/A N/A Excoriation: No Induration: No Callus: No Crepitus: No Rash: No Periwound Skin Moisture: Maceration: No N/A N/A Dry/Scaly: No Periwound Skin Color: Erythema: Yes N/A N/A Rubor: Yes Atrophie Blanche: No Cyanosis: No Ecchymosis: No Hemosiderin Staining: No Mottled: No Pallor: No Erythema Location: Circumferential N/A N/A Temperature: No Abnormality N/A N/A Tenderness on Palpation: Yes N/A N/A Treatment Notes Electronic Signature(s) Signed: 08/11/2018 3:16:51 PM By: Arnette Norris Entered By: Arnette Norris on 08/11/2018 12:58:49 Jessica [name] ([id number]) -------------------------------------------------------------------------------- Multi-Disciplinary Care Plan Details [name]. Date of Service: 08/11/2018 12:30 PM Medical Record Number: [id number] Patient Account Number: [id number] Date of Birth/Sex: 05-23-1945 (73 y.o. F) Treating RN: Arnette Norris Primary Care  Collie Kittel: Annita Brod Other Clinician: Referring Rylyn Zawistowski: Annita Brod Treating Trudy Kory/Extender: Linwood Dibbles, HOYT Weeks in Treatment: 33 Active Inactive Orientation to the Wound Care Program Nursing Diagnoses: Knowledge deficit related to the wound healing center program Goals: Patient/caregiver will verbalize understanding of the Wound Healing Center Program Date Initiated: 06/03/2017 Target Resolution Date: 06/17/2017 Goal Status: Active Interventions: Provide education on orientation to the wound center Notes: Pressure Nursing Diagnoses: Knowledge deficit related to causes and risk factors for pressure ulcer development Knowledge deficit related to management of pressures ulcers Potential for impaired tissue integrity related to pressure, friction, moisture, and shear Goals: Patient will remain free from development of additional pressure ulcers Date Initiated: 06/03/2017 Target Resolution Date: 06/17/2017 Goal Status: Active Patient will remain free of pressure ulcers Date Initiated: 06/03/2017 Target Resolution Date: 06/17/2017 Goal Status: Active Patient/caregiver will verbalize risk factors for pressure ulcer development Date Initiated: 06/03/2017 Target Resolution Date: 06/17/2017 Goal Status: Active Interventions: Assess: immobility, friction, shearing, incontinence upon admission and as needed Assess potential for pressure ulcer upon admission and as needed Notes: Wound/Skin Impairment KORBIN, MUESSIG ([id number]) Nursing Diagnoses: Impaired tissue integrity Goals: Patient/caregiver will verbalize understanding of skin care regimen Date Initiated: 06/03/2017 Target Resolution Date: 06/17/2017 Goal Status: Active Ulcer/skin breakdown will have a volume reduction of 30% by week 4 Date Initiated: 06/03/2017 Target Resolution Date: 06/17/2017 Goal Status: Active Interventions: Assess ulceration(s) every visit Treatment Activities: Patient referred to home care :  06/03/2017 Skin care regimen initiated : 06/03/2017 Notes: Electronic Signature(s) Signed: 08/11/2018 3:16:51 PM By: Arnette Norris Entered By: Arnette Norris on 08/11/2018 12:58:40 Jessica [name] ([id number]) -------------------------------------------------------------------------------- Pain Assessment Details [name]. Date of Service: 08/11/2018 12:30 PM Medical Record Number: [id number] Patient Account Number: [id number] Date of Birth/Sex: 1945/10/27 (73 y.o. F) Treating RN: Arnette Norris Primary Care Deserae Jennings: Annita Brod Other Clinician: Referring Camrin Lapre: Annita Brod Treating Brekyn Huntoon/Extender: STONE III, HOYT Weeks in Treatment: 20 Active Problems Location of Pain Severity and Description of Pain Patient Has Paino No Site Locations Pain Management and Medication Current Pain Management: Electronic Signature(s) Signed: 08/11/2018 3:16:51 PM By: Arnette Norris Entered By: Arnette Norris on 08/11/2018 12:41:25 Jessica [name] ([id number]) -------------------------------------------------------------------------------- Patient/Caregiver Education Details [name]. Date of Service: 08/11/2018 12:30 PM Medical Record Number: [id number] Patient Account Number: [id number] Date of Birth/Gender: 08/06/1945 (73 y.o. F) Treating RN: Arnette Norris Primary Care Physician: Annita Brod Other Clinician: Referring Physician: Annita Brod Treating  Physician/Extender: Lenda KelpSTONE III, HOYT Weeks in Treatment: 1463 Education Assessment Education Provided To: Patient Education Topics Provided Wound/Skin Impairment: Handouts: Caring for Your Ulcer Methods: Demonstration, Explain/Verbal Responses: State content correctly Electronic Signature(s) Signed: 08/11/2018 3:16:51 PM By: Arnette NorrisBiell, Kristina Entered By: Arnette NorrisBiell, Kristina on 08/11/2018 12:59:09 Jessica BradyVERMAN, Grey W.  ([id number]) -------------------------------------------------------------------------------- Wound Assessment Details Patient Name: Jessica BradyVERMAN, Thaila W. Date of Service: 08/11/2018 12:30 PM Medical Record Number: [id number] Patient Account Number: [id number] Date of Birth/Sex: 1946/01/04 83(73 y.o. F) Treating RN: Arnette NorrisBiell, Kristina Primary Care Kahliyah Dick: Annita BrodASENSO, PHILIP Other Clinician: Referring Shakedra Beam: Annita BrodASENSO, PHILIP Treating Shanessa Hodak/Extender: STONE III, HOYT Weeks in Treatment: 63 Wound Status Wound Number: 1 Primary Etiology: Pressure Ulcer Wound Location: Sacrum - Medial Wound Status: Open Wounding Event: Pressure Injury Date Acquired: 01/15/2017 Weeks Of Treatment: 63 Clustered Wound: No Photos Wound Measurements Length: (cm) 7.4 % Reductio Width: (cm) 4.5 % Reductio Depth: (cm) 3 Epithelial Area: (cm) 26.154 Tunneling Volume: (cm) 78.461 Undermini Startin Ending Maximum n in Area: 29% n in Volume: -112.9% ization: None : No ng: Yes g Position (o'clock): 7 Position (o'clock): 1 Distance: (cm) 4 Wound Description Classification: Category/Stage IV Foul Odor Wound Margin: Distinct, outline attached Due to Pro Exudate Amount: Medium Slough/Fib Exudate Type: Sanguinous Exudate Color: red After Cleansing: Yes duct Use: No rino Yes Wound Bed Granulation Amount: Large (67-100%) Exposed Structure Granulation Quality: Red, Pink, Hyper-granulation Fascia Exposed: Yes Necrotic Amount: Small (1-33%) Fat Layer (Subcutaneous Tissue) Exposed: Yes Necrotic Quality: Adherent Slough Tendon Exposed: No Muscle Exposed: Yes Necrosis of Muscle: No Jessica BradyOVERMAN, Terrilee W. ([id number]) Joint Exposed: No Bone Exposed: No Periwound Skin Texture Texture Color No Abnormalities Noted: No No Abnormalities Noted: No Callus: No Atrophie Blanche: No Crepitus: No Cyanosis: No Excoriation: No Ecchymosis: No Induration: No Erythema: Yes Rash: No Erythema Location:  Circumferential Scarring: Yes Hemosiderin Staining: No Mottled: No Moisture Pallor: No No Abnormalities Noted: No Rubor: Yes Dry / Scaly: No Maceration: No Temperature / Pain Temperature: No Abnormality Tenderness on Palpation: Yes Treatment Notes Wound #1 (Medial Sacrum) Notes Dakins soaked gauze packed into wound, BFD Electronic Signature(s) Signed: 08/11/2018 3:16:51 PM By: Arnette NorrisBiell, Kristina Entered By: Arnette NorrisBiell, Kristina on 08/11/2018 12:53:43 Jessica BradyVERMAN, [place] W. ([id number]) -------------------------------------------------------------------------------- Vitals Details Patient Name: Jessica BradyVERMAN, Dominigue W. Date of Service: 08/11/2018 12:30 PM Medical Record Number: [id number] Patient Account Number: [id number] Date of Birth/Sex: 1946/01/04 48(73 y.o. F) Treating RN: Arnette NorrisBiell, Kristina Primary Care Blease Capaldi: Annita BrodASENSO, PHILIP Other Clinician: Referring Byran Bilotti: Annita BrodASENSO, PHILIP Treating Kalena Mander/Extender: STONE III, HOYT Weeks in Treatment: 63 Vital Signs Time Taken: 12:40 Temperature (F): 98.1 Height (in): 70 Pulse (bpm): 69 Respiratory Rate (breaths/min): 18 Blood Pressure (mmHg): 129/61 Reference Range: 80 - 120 mg / dl Electronic Signature(s) Signed: 08/11/2018 3:16:51 PM By: Arnette NorrisBiell, Kristina Entered By: Arnette NorrisBiell, Kristina on 08/11/2018 12:43:33

## 2018-08-11 NOTE — Progress Notes (Signed)
DORETTA, REMMERT ([id number]) Visit Report for 08/11/2018 Chief Complaint Document Details [name], Jessica [name]. Date of Service: 08/11/2018 12:30 PM Medical Record Number: [id number] Patient Account Number: [id number] Date of Birth/Sex: 10/28/1945 (73 y.o. F) Treating RN: Huel Coventry Primary Care Provider: Annita Brod Other Clinician: Referring Provider: Annita Brod Treating Provider/Extender: Linwood Dibbles, [place] Weeks in Treatment: 19 Information Obtained from: Patient Chief Complaint she is here for evaluation of a sacral ulcer and bilateral lower extremity ulcers Electronic Signature(s) Signed: 08/11/2018 3:36:10 PM By: Lenda Kelp PA-C Entered By: Lenda Kelp on 08/11/2018 12:36:04 Jessica [name] ([id number]) -------------------------------------------------------------------------------- HPI Details [name]. Date of Service: 08/11/2018 12:30 PM Medical Record Number: [id number] Patient Account Number: [id number] Date of Birth/Sex: 09/26/1945 (73 y.o. F) Treating RN: Huel Coventry Primary Care Provider: Annita Brod Other Clinician: Referring Provider: Annita Brod Treating Provider/Extender: Linwood Dibbles, [place] Weeks in Treatment: 45 History of Present Illness HPI Description: 05/27/17-she is here in initial evaluation for a left-sided sacral stage IV pressure ulcer and bilateral lower extremity, lateral aspect, unstageable pressure ulcers. She is accompanied by her husband and her son, who are her primary caregivers. She is bedbound secondary to spinal stenosis. According to her son and husband she was hospitalized from 10/5-11/1 with healthcare associated pneumonia and altered mental status. During her hospitalization she was intubated, extubated on 10/27. She was discharged with Foley catheter and follow up with urology. According to her son and spouse she developed the sacral ulcer during hospitalization. Home health has been  applying Santyl daily. She does have a low air loss mattress and is repositioned every 2-3 hours per her family's report. According to the son and spouse she had an appointment with urology on 12/18 and during that appointment developed discoloration to her bilateral lower extremities which ultimately developed into unstageable pressure ulcers to the lateral aspects of her bilateral lower extremities. There has been no topical treatment applied to these. She continues to have home health. There is no concerns expressed regarding dietary intake, stating she eats 3 meals a day, eating was provided; she is supplemented with boost with protein. 06/03/17-she is here in follow-up evaluation for sacral and bilateral lower extremity ulcers. Plain film x-ray done today reveals no distraction to the sacrum or coccyx, no visible abnormalities. Home health has ordered the negative pressure wound system but it has not been initiated. We will continue with Hydrofera Blue until initiation of negative pressure wound system and continue with Santyl to bilateral lower extremity ulcers. Follow-up next week 06/10/17-she is here in follow-up evaluation for sacral and bilateral lower extremity ulcers. The wound VAC will be available tomorrow per home health. We will initiate wound VAC therapy to the sacral ulcer 3 times weekly (Thursday, Saturday/Sunday, Tuesday). We will continue with Santyl to the lower extremity ulcers. The patient's son is checking into home health therapy over the weekend for [facility] changes, with the understanding that if VAC changes cannot be performed over the weekend he will need to change his mother's appointments to Monday, Wednesday or Friday. The sacral ulcer clinically does not appear infected but there has been a change in the amount of drainage acutely, there is no significant amount of devitalized tissue, there is no malodor. Wound culture was obtained to evaluate for occult infection we will  hold off on antibiotic therapy until sensitivities are resulted. 06/18/17 on evaluation today patient appears to be doing fairly well in my opinion although this is the first time I  have seen this patient she has been previously evaluated by Tacey RuizLeah here in our office. She is going to be switching to Fridays to see me due to the Wound VAC schedule being changed on Monday, Wednesday, and Friday. Subsequently she seems to be doing fairly well with the Wound VAC. Her son who was present during evaluation today states that he somewhat stresses of the Wound VAC in making sure that it was functioning appropriately. With that being said everything seems to be working well he knows what settings on the [facility] HVAC itself to look at and ensure that it is functioning properly. Overall the wound appears to be nice and clean there is no need for debridement today. She has no discomfort in her bilateral lower extremity ulcers also appear to be improving based on measurements and what this honest tell me about the overall appearance. 07/02/17 on evaluation today I noted in patients wound bed that was actually an odor that had not previously been noted. Subsequently there was also a small area of bone that was not previously noted during my last evaluation. This did appear to be necrotic and was being somewhat forced out by the body around the region of granulation. She does not have any pain which is good news. Nonetheless the overall appearance of the ulcer is making me concerned for the patient having developed osteomyelitis. Currently she is not been on any antibiotics and the Wound VAC has been doing fairly well in general. With that being said I do not think we need to continue the Wound VAC if she potentially has a bone infection. At least not until it is properly addressed with antibiotics. 07/09/17 on evaluation today patient appears to actually be doing very well in regard to her sacral ulcer compared to last  week. There is actually no exposed bone at this point. 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 With that being said still I do believe she needs to have a referral to infectious disease due to the osteomyelitis I am gonna recommend that she continue with the doxycycline based on the culture results which showed Eikenella Corrodens as the Jessica BradyOVERMAN, [name] ([id number]) organism identified that tetracycline should work for. She does not seem to be having any discomfort whatsoever at this point. 07/16/17 on evaluation today patient actually appears to be doing rather well in regard to her sacral wound. I think that the original wound site actually appears much better than previously noted. With that being said she does have a new superficial injury on the right sacral region which appears to be due to according to the sign transport that occurred for her MRI unfortunately. Fortunately this is not too deep and I do think it can be managed but it was a new area that was not previously noted. Otherwise she has been tolerating the Dakinos soaked gauze packing without complication. 07/30/17 on evaluation today patient presents for reevaluation concerning her sacral wound. She has been tolerating the dressing changes without complication. With that being said her wound is doing so well at this point that I think she may be at the point where we could reinitiate the Wound VAC currently and hopefully see good results from this. I'm very pleased with how she has responded to the Dakinos soaked gauze packing. 08/13/17 evaluation today patient appears to be doing excellent in  regard to her lower extremity ulcer this seems to be cleaning up very nicely. In regard to the sacral ulcer the area of trauma on the right lateral portion of the wound actually appears  to be much better than previously noted during the last evaluation. The word has definitely filled in and the Wound VAC appears to be helpful I do believe. Overall I'm very pleased with how things seem to be progressing at this time. Patient likewise is also happy that things are doing well. 08/27/17 on evaluation today patient presents for follow-up concerning her ongoing issues with her sacral ulcer and lower extremity ulcer. Fortunately she has been tolerating the dressing changes well complication the Wound VAC in general seems to have done very well up to this point. She does not have any evidence of infection which is good news. She does have some dark discoloration in central portion of the wound which is troublesome for the possibility of pressure injury to the site it sounds like her prior air mattress was not functioning properly her son has just bought a new one for her this is doing much better. Otherwise things seem to be progressing nicely. 09/10/17 on evaluation today patient presents for follow-up concerning her sacral wound and lower extremity ulcer. She has been tolerating the switch of her dressing changes to the Dakinos soaked gauze dressing very well in regard to the sacrum. The left lateral lower extremity ulcer appears to have a new area open just distal to the one that we have been treating with Santyl and this is new since her last visit. There does not appear to be any evidence of infection which is good news. With that being said there's a lot of maceration here at the site and I feel this is likely due to the switch and the dressings it appears that due to the sticking of the dressings it will switch to utilizing a telfa pad over the Santyl. I think this caused a lot of drainage to be collected and situated in the region just under the dressing which has led to this causing which duration of breakdown. Overall there does not appear to be any significant pain which is good  news. Of note I did actually have a conversation with the radiologist who is an interventional radiologist with [facility]. 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 With that being said there does not appear to be any rush to get this done according to the interventional radiologist whom I spoke with. Therefore after discussion it was determined that there gonna wait about six months prior to considering the procedure to get time for hopefully the sacral wound to heal in the interim to at least some degree. 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 According to nursing staff these are all improved, the negative pressure wound therapy system was put on hold last week. We will resume negative pressure wound therapy today, continue with Santyl to the lower extremity and she will follow-up next week. 09/24/17 on evaluation today unfortunately the patient's wound appears to be doing significantly worse compared to last week's evaluation and even my valuation the week before. She actually has bone noted on the left wound margin in the region of undermining unfortunately. This was not noted during the last evaluation. I am concerned about the fact that this seems to be worse not better since  our last visit with her. My biggest concern is that she's likely developing a worsening osteomyelitis. This was discussed with patient and her son today during the office visit. 05/03/17 on evaluation today patient actually appears to be doing rather well in regard to her sacral ulcer compared to last week's evaluation. She has been tolerating the dressing changes without complication. Fortunately even though we're not utilizing the Wound VAC she seems to be making some strides in seeing the wound area overall improved quite dramatically in my pinion in just one weeks time. She also seems to  be staying off of this to the point that I do not see any evidence of new injury which is excellent news. Whatever she's been doing in that regard over the past week I would want her to continue. I did also review the results of her bone culture which revealed group B strep as the responsible organism. Again this is what's causing her osteomyelitis she does have infectious disease appointment scheduled for 10/11/17 10/08/1898 valuation today patient appears to be doing better for the most part in regard to the sacral wound. Overall she is showing signs of having granulation of the bone which is good news. There is an area towards the left of the wound where she does seem to be showing some signs of opening this it would be due to additional pressure to the area unfortunately. ZUHUR, BORGES. ([id number]) There does not appear to be any evidence of infection spreading which is good news that do not see any evidence of significant purulent discharge which is also good news. 10/26/17 on evaluation today patient sacral ulcer actually appears to be very healthy and doing well in regard to the overall appearance of the wound. With that being said she does seem to be having some issues at this point in time with some shear/friction injury of the sacrum from where she was transported to [facility] for her infectious disease appointment. She has been placed on IV antibiotic therapy I will have to go back and find that note for review as I did not have access to that today. Nonetheless I will add the next visit be sure to document the exact antibiotics that she is taking at this point. Nonetheless I do believe the wound is doing better there's no bone exposure nothing that requires debridement in regard to the sacral wound. Likewise her left lateral lower extremity ulcer also appears to be doing significantly better. In general I feel like things are showing signs of improvement all around. 11/12/17 on  evaluation today patient appears to be doing rather well in regard to her sacral wound. In general I do see signs of granulation at this point. Fortunately there does not appear to be any evidence of infection which is good news as well. In general overall happy with the appearance. With that being said I'll be see I would like for this to be progressing more rapidly as with the patient and her son but nonetheless at least things do not appear to be doing worse. Her lower extremity ulcer is doing significantly better. 11/26/17 on evaluation today patient appears to be doing a little better in regard to the sacral wound especially in the peripheral of the wound bed. She actually did have an abrasion on the right gluteal region that's completely resolved to this point. Her lower extremity also appears to be for the most part completely resolved. Overall the sacrum itself seems to be the one thing that is  still open and given her trouble. No fevers, chills, nausea, or vomiting noted at this time. She actually has an appointment next week with infectious disease for recheck to see how things are doing. She maybe switch to oral medications at that point. 12/09/17 on evaluation today patient actually appears to be doing much better in regard to her sacral wound. I do feel like she has made good progress at this time as far as healing is concerned. In fact the wound looks better to me today than it has in quite some time. She is on oral antibiotic therapy and no longer on the IV antibiotics. She did see infectious disease last week. 12/24/17 on evaluation today patient's wounds actually appear to be doing decently well in regard to the sacral wound in particular. When it comes to her lower extremity ulcers both appear to be completely healed at this point in the one area where she had lived the rash has completely resolved at this time. Overall very pleased with the progress that has been made. Nonetheless  the patient seems to be doing well in general. She still does have quite a bit of healing to do in regard to the sacral wound but I feel like she is making progress. 01/07/18 on evaluation today patient actually appears to be doing excellent today regard to her sacral wound. The granular quality in the base of the wound is excellent and shown signs of good improvement there's no evidence of contusion nor deep tissue injury and the periwound actually appears to be doing well. In general I'm very pleased with the overall appearance. 02/04/18 on evaluation today patient's wound actually appears to be doing decently well as far as the granulation is concerned. She has been tolerating the dressing changes without complication. Right now we are still using the Wound VAC. Nonetheless overall there apparently is some cater to the wound and this is concerning simply for the fact that she could be developing a soft tissue infection again. She's had this happen in the past and at that time responded very well to the antibiotics. Nonetheless I don't think I would likely put her on anything prophylactically but rather wait for the results of the culture to return. 02/18/18 on evaluation today patient actually appears to be doing fairly well. She has been tolerating the dressing changes. And the Wound VAC seems to be doing well. Overall I'm very pleased with how things have improved over the past couple weeks since I last saw her. Fortunately there does not appear to be any evidence of overt infection at this time which is good news. She has been taking the antibiotics without complication. Specifically that is the Bactrim DS which was prescribed on 02/08/18. 03/18/18 on evaluation today patient's wound actually does have quite a bit of odor noted compared to previous. With that being said the wound itself overall does not appear to be too significant or worse. There is no event noted in the wound bed which is good  news. With that being said the patient has been worried as the home health nurse stated that she thought she saw bone in the base of the wound. Nonetheless there's a little bit of bruising along the left lateral border of the wound bed which again does not appear to be terrible we have noticed this before but other than this the majority the wound bed appears to be doing excellent in my pinion. I am wondering if there may be some bacterial colonization. This  could be leading to some of the odor that we are noting with the Wound VAC. Jessica BradyOVERMAN, [name] ([id number]) 04/01/18 on evaluation today patient appears to actually be doing well in regard to the overall appearance of the wound itself. She also does not have any odor at this point in regard to the wound surface and I do believe the Bacons is extremely helpful in this regard. With that being said she does have a little bit of deep tissue injury on the medial aspect of the wound. Again last time it was more lateral. The lateral seems to have improved quite well. Nonetheless she also notes by way of her son that this has been very macerated in the past several weeks since I last saw her. Though things overall seem to be doing better I still think that the Wound VAC may help with managing her fluid better I'm wondering if we can attempt a gauze Wound VAC my hope is that this will be able to manage the moisture control as well as continuing with managing the odor of the wound and bacterial load since the Dakinos has done excellent in helping in that regard. 04/15/17 on evaluation today patient actually appears to be doing much better in regard to the overall appearance of her sacral wound. I do believe that the gauze Vac has been of benefit for her. Overall she is tolerating this without complication which is also good news. There is no sign of injury to the sacral region at this point. 04/29/18 on evaluation today patient appears to be doing well in  regard to her sacral ulcer. She sure signs of good granulation and the Gauze VAC is doing very well for her. Overall I'm pleased in this regard. The biggest issue is that the time from Friday till Monday seems to be quite long for her as far as the amount of time to keep the Wound VAC sealed. For that reason her son wonders if he can take this off on Sunday morning in order to give her day of just wet to dry dressing's with the Dakin's solution I think this would be okay. 05/13/18 on evaluation today patient appears to be doing okay in regard to her sacral ulcer. There does not appear to be any evidence of overt infection at this time which is good news. In general the drainage and moisture appears to be significantly improved this was after the pressure on the Wound VAC was increased after they contacted our office. With that being said a lot of the dark tissue around the edge of the wound has improved in the maceration is significantly better this seems to be managing much better. Fortunately there is no signs of infection at this time. Overall the patient is doing quite well. The wound measurements are not significantly smaller today although they were over the past two visits we will see how things go. 05/27/18 on evaluation today patient's sacral wound actually appears to be doing fairly well at this point. She does have a deep tissue injury yet again on the medial aspect of the wound which is something that we seem to be seeing often. There is some question as to whether or not this is actually being caused by the Wound VAC bunching up and then her somewhat laying on it even though they are attempting to offload this as best as possible. Patient son states that overall they do the best they can trying offload her and I definitely believe him. I  may want to see about putting the Wound VAC on hold for a couple weeks and see if this happens just with standard dressing changes. 06/10/18 on evaluation  today patient actually appears to be doing better in regard to her sacral wound. Overall there's no signs of infection, no odor, and no significant skin breakdown around the edge of the wound I feel like this is done much better 50 Dakinos soaked gauze dressing as opposed to legalizing anything such as the Wound VAC. I believe that we discontinue the Wound VAC as of today. 06/24/18 on evaluation today patient actually appears to be doing well in regard to her sacral ulcer. There's no signs of infection, no odor, and in my pinion no concerns at all. I feel like she is making good progress as far as I'm concerned. 07/12/18 on evaluation today patient appears to be doing very well in regard to her sacral ulcer. She has been tolerating the dressing changes without complication. Fortunately there does not appear to be any evidence of active infection at this time she is doing excellent as far as I'm concerned at this point. 08/11/18 on evaluation today patient's sacral wound actually appears to be doing quite well at this point. The main issue I see is that she continues to have some maceration and skin breakdown due to moisture over the right periwound location unfortunately. We've investigated the possibility of using zinc oxide before but have not utilized it simply due to the fact that she continues to state. Her son anyways that she is allergic to this. They have not tried it recently and may be completely unrelated to the reaction she had before. Nonetheless this may be something you want to attempt will discuss that in the plan. Electronic Signature(s) Signed: 08/11/2018 3:36:10 PM By: Lenda Kelp PA-C Entered By: Lenda Kelp on 08/11/2018 14:56:47 Jessica [name] ([id number]) -------------------------------------------------------------------------------- Physical Exam Details [name]. Date of Service: 08/11/2018 12:30 PM Medical Record Number:  [id number] Patient Account Number: [id number] Date of Birth/Sex: [birth year] (73 y.o. F) Treating RN: Huel Coventry Primary Care Provider: Annita Brod Other Clinician: Referring Provider: Annita Brod Treating Provider/Extender: STONE III, Sylvio Weatherall Weeks in Treatment: 66 Constitutional Well-nourished and well-hydrated in no acute distress. Respiratory normal breathing without difficulty. clear to auscultation bilaterally. Cardiovascular regular rate and rhythm with normal S1, S2. Psychiatric this patient is able to make decisions and demonstrates good insight into disease process. Alert and Oriented x 3. pleasant and cooperative. Notes Patient's wound bed actually showed signs of good granulation there is no evidence of injury such as pressure injury at this point. The muscle maceration skin breakdown along the right edge of the periwound which I think is moisture related. I do think that think outside could be beneficial in this regard. Otherwise everything appears to be doing well. Electronic Signature(s) Signed: 08/11/2018 3:36:10 PM By: Lenda Kelp PA-C Entered By: Lenda Kelp on 08/11/2018 15:18:34 Jessica [name] ([id number]) -------------------------------------------------------------------------------- Physician Orders Details [name]. Date of Service: 08/11/2018 12:30 PM Medical Record Number: [id number] Patient Account Number: [id number] Date of Birth/Sex: June 18, 1945 (73 y.o. F) Treating RN: Arnette Norris Primary Care Provider: Annita Brod Other Clinician: Referring Provider: Annita Brod Treating Provider/Extender: Linwood Dibbles, Lavonia Eager Weeks in Treatment: 51 Verbal / Phone Orders: No Diagnosis Coding ICD-10 Coding Code Description L89.154 Pressure ulcer of sacral region, stage 4 M48.00 Spinal stenosis, site unspecified R54 Age-related physical debility G89.4 Chronic pain syndrome E78.2 Mixed hyperlipidemia  L89.93 Pressure ulcer of  unspecified site, stage 3 Wound Cleansing Wound #1 Medial Sacrum o Other: - please cleanse sacral wound with dakins moistened gauze - do not spray dakins on wound Anesthetic (add to Medication List) Wound #1 Medial Sacrum o Topical Lidocaine 4% cream applied to wound bed prior to debridement (In Clinic Only). Skin Barriers/Peri-Wound Care Wound #1 Medial Sacrum o Skin Prep Primary Wound Dressing Wound #1 Medial Sacrum o Pack wound with: - Dakin's soaked gauze Secondary Dressing Wound #1 Medial Sacrum o ABD pad - secure with tape Dressing Change Frequency Wound #1 Medial Sacrum o Change dressing twice daily. Follow-up Appointments Wound #1 Medial Sacrum o Return Appointment in 3 weeks. Off-Loading Wound #1 Medial Sacrum o Turn and reposition every 2 hours Longest, Bridgette W. ([id number]) Additional Orders / Instructions Wound #1 Medial Sacrum o Increase protein intake. Home Health Wound #1 Medial Sacrum o Initiate Home Health for Skilled Nursing - [facility] o Home Health Nurse may visit PRN to address patientos wound care needs. o FACE TO FACE ENCOUNTER: MEDICARE and MEDICAID PATIENTS: I certify that this patient is under my care and that I had a face-to-face encounter that meets the physician face-to-face encounter requirements with this patient on this date. The encounter with the patient was in whole or in part for the following MEDICAL CONDITION: (primary reason for Home Healthcare) MEDICAL NECESSITY: I certify, that based on my findings, NURSING services are a medically necessary home health service. HOME BOUND STATUS: I certify that my clinical findings support that this patient is homebound (i.e., Due to illness or injury, pt requires aid of supportive devices such as crutches, cane, wheelchairs, walkers, the use of special transportation or the assistance of another person to leave their place of residence. There is a normal inability to leave the  home and doing so requires considerable and taxing effort. Other absences are for medical reasons / religious services and are infrequent or of short duration when for other reasons). o If current dressing causes regression in wound condition, may D/C ordered dressing product/s and apply Normal Saline Moist Dressing daily until next Wound Healing Center / Other MD appointment. Notify Wound Healing Center of regression in wound condition at [phone]. o Please direct any NON-WOUND related issues/requests for orders to patient's Primary Care Physician Patient Medications Allergies: zinc, Ambien Notifications Medication Indication Start End triamcinolone acetonide 08/09/2018 DOSE 0 - topical 0.1 % cream - cream topical applied to irritated region no more than 2 weeks at a time as needed Electronic Signature(s) Signed: 08/11/2018 3:20:30 PM By: Lenda Kelp PA-C Previous Signature: 08/11/2018 3:16:51 PM Version By: Arnette Norris Entered By: Lenda Kelp on 08/11/2018 15:20:29 Jessica [name] ([id number]) -------------------------------------------------------------------------------- Problem List Details [name]. Date of Service: 08/11/2018 12:30 PM Medical Record Number: [id number] Patient Account Number: [id number] Date of Birth/Sex: 06-25-1945 (73 y.o. F) Treating RN: Huel Coventry Primary Care Provider: Annita Brod Other Clinician: Referring Provider: Annita Brod Treating Provider/Extender: Linwood Dibbles, Romina Divirgilio Weeks in Treatment: 42 Active Problems ICD-10 Evaluated Encounter Code Description Active Date Today Diagnosis L89.154 Pressure ulcer of sacral region, stage 4 05/27/2017 No Yes M48.00 Spinal stenosis, site unspecified 05/27/2017 No Yes R54 Age-related physical debility 05/27/2017 No Yes G89.4 Chronic pain syndrome 05/27/2017 No Yes E78.2 Mixed hyperlipidemia 05/27/2017 No Yes L89.93 Pressure ulcer of unspecified site, stage 3 05/27/2017 No  Yes Inactive Problems Resolved Problems Electronic Signature(s) Signed: 08/11/2018 3:36:10 PM By: Lenda Kelp PA-C Entered By: Lenda Kelp  on 08/11/2018 12:35:52 Jessica [name], Jessica [name] ([id number]) -------------------------------------------------------------------------------- Progress Note Details [name], Jessica [name]. Date of Service: 08/11/2018 12:30 PM Medical Record Number: [id number] Patient Account Number: [id number] Date of Birth/Sex: 05-Jan-1946 (73 y.o. F) Treating RN: Huel Coventry Primary Care Provider: Annita Brod Other Clinician: Referring Provider: Annita Brod Treating Provider/Extender: Linwood Dibbles, Janasia Coverdale Weeks in Treatment: 52 Subjective Chief Complaint Information obtained from Patient she is here for evaluation of a sacral ulcer and bilateral lower extremity ulcers History of Present Illness (HPI) 05/27/17-she is here in initial evaluation for a left-sided sacral stage IV pressure ulcer and bilateral lower extremity, lateral aspect, unstageable pressure ulcers. She is accompanied by her husband and her son, who are her primary caregivers. She is bedbound secondary to spinal stenosis. According to her son and husband she was hospitalized from 10/5-11/1 with healthcare associated pneumonia and altered mental status. During her hospitalization she was intubated, extubated on 10/27. She was discharged with Foley catheter and follow up with urology. According to her son and spouse she developed the sacral ulcer during hospitalization. Home health has been applying Santyl daily. She does have a low air loss mattress and is repositioned every 2-3 hours per her family's report. According to the son and spouse she had an appointment with urology on 12/18 and during that appointment developed discoloration to her bilateral lower extremities which ultimately developed into unstageable pressure ulcers to the lateral aspects of her bilateral lower extremities. There  has been no topical treatment applied to these. She continues to have home health. There is no concerns expressed regarding dietary intake, stating she eats 3 meals a day, eating was provided; she is supplemented with boost with protein. 06/03/17-she is here in follow-up evaluation for sacral and bilateral lower extremity ulcers. Plain film x-ray done today reveals no distraction to the sacrum or coccyx, no visible abnormalities. Home health has ordered the negative pressure wound system but it has not been initiated. We will continue with Hydrofera Blue until initiation of negative pressure wound system and continue with Santyl to bilateral lower extremity ulcers. Follow-up next week 06/10/17-she is here in follow-up evaluation for sacral and bilateral lower extremity ulcers. The wound VAC will be available tomorrow per home health. We will initiate wound VAC therapy to the sacral ulcer 3 times weekly (Thursday, Saturday/Sunday, Tuesday). We will continue with Santyl to the lower extremity ulcers. The patient's son is checking into home health therapy over the weekend for [facility] changes, with the understanding that if VAC changes cannot be performed over the weekend he will need to change his mother's appointments to Monday, Wednesday or Friday. The sacral ulcer clinically does not appear infected but there has been a change in the amount of drainage acutely, there is no significant amount of devitalized tissue, there is no malodor. Wound culture was obtained to evaluate for occult infection we will hold off on antibiotic therapy until sensitivities are resulted. 06/18/17 on evaluation today patient appears to be doing fairly well in my opinion although this is the first time I have seen this patient she has been previously evaluated by Tacey Ruiz here in our office. She is going to be switching to Fridays to see me due to the Wound VAC schedule being changed on Monday, Wednesday, and Friday. Subsequently she  seems to be doing fairly well with the Wound VAC. Her son who was present during evaluation today states that he somewhat stresses of the Wound VAC in making sure that it was functioning appropriately. With  that being said everything seems to be working well he knows what settings on the [facility] itself to look at and ensure that it is functioning properly. Overall the wound appears to be nice and clean there is no need for debridement today. She has no discomfort in her bilateral lower extremity ulcers also appear to be improving based on measurements and what this honest tell me about the overall appearance. 07/02/17 on evaluation today I noted in patients wound bed that was actually an odor that had not previously been noted. Subsequently there was also a small area of bone that was not previously noted during my last evaluation. This did appear to be necrotic and was being somewhat forced out by the body around the region of granulation. She does not have any pain which is good news. Nonetheless the overall appearance of the ulcer is making me concerned for the patient having developed osteomyelitis. Currently she is not been on any antibiotics and the Wound VAC has been doing fairly well in general. With that being said I do not think we need to continue the Wound VAC if she potentially has a bone infection. At least not until it is properly addressed with antibiotics. Jessica [name], Jessica [name] ([id number]) 07/09/17 on evaluation today patient appears to actually be doing very well in regard to her sacral ulcer compared to last week. There is actually no exposed bone at this point. 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 With that being said still I do believe she needs to have a referral to infectious disease due to the osteomyelitis I am  gonna recommend that she continue with the doxycycline based on the culture results which showed Eikenella Corrodens as the organism identified that tetracycline should work for. She does not seem to be having any discomfort whatsoever at this point. 07/16/17 on evaluation today patient actually appears to be doing rather well in regard to her sacral wound. I think that the original wound site actually appears much better than previously noted. With that being said she does have a new superficial injury on the right sacral region which appears to be due to according to the sign transport that occurred for her MRI unfortunately. Fortunately this is not too deep and I do think it can be managed but it was a new area that was not previously noted. Otherwise she has been tolerating the Dakin s soaked gauze packing without complication. 07/30/17 on evaluation today patient presents for reevaluation concerning her sacral wound. She has been tolerating the dressing changes without complication. With that being said her wound is doing so well at this point that I think she may be at the point where we could reinitiate the Wound VAC currently and hopefully see good results from this. I'm very pleased with how she has responded to the [facility] s soaked gauze packing. 08/13/17 evaluation today patient appears to be doing excellent in regard to her lower extremity ulcer this seems to be cleaning up very nicely. In regard to the sacral ulcer the area of trauma on the right lateral portion of the wound actually appears to be much better than previously noted during the last evaluation. The word has definitely filled in and the Wound VAC appears to be helpful I do believe. Overall I'm very pleased with how things seem to be progressing at this  time. Patient likewise is also happy that things are doing well. 08/27/17 on evaluation today patient presents for follow-up concerning her ongoing issues with her sacral ulcer and  lower extremity ulcer. Fortunately she has been tolerating the dressing changes well complication the Wound VAC in general seems to have done very well up to this point. She does not have any evidence of infection which is good news. She does have some dark discoloration in central portion of the wound which is troublesome for the possibility of pressure injury to the site it sounds like her prior air mattress was not functioning properly her son has just bought a new one for her this is doing much better. Otherwise things seem to be progressing nicely. 09/10/17 on evaluation today patient presents for follow-up concerning her sacral wound and lower extremity ulcer. She has been tolerating the switch of her dressing changes to the Dakin s soaked gauze dressing very well in regard to the sacrum. The left lateral lower extremity ulcer appears to have a new area open just distal to the one that we have been treating with Santyl and this is new since her last visit. There does not appear to be any evidence of infection which is good news. With that being said there's a lot of maceration here at the site and I feel this is likely due to the switch and the dressings it appears that due to the sticking of the dressings it will switch to utilizing a telfa pad over the Santyl. I think this caused a lot of drainage to be collected and situated in the region just under the dressing which has led to this causing which duration of breakdown. Overall there does not appear to be any significant pain which is good news. Of note I did actually have a conversation with the radiologist who is an interventional radiologist with [facility]. 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 With that being said there does not appear to be any rush to get this done according to the interventional radiologist whom I spoke with. Therefore after  discussion it was determined that there gonna wait about six months prior to considering the procedure to get time for hopefully the sacral wound to heal in the interim to at least some degree. 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 According to nursing staff these are all improved, the negative pressure wound therapy system was put on hold last week. We will resume negative pressure wound therapy today, continue with Santyl to the lower extremity and she will follow-up next week. 09/24/17 on evaluation today unfortunately the patient's wound appears to be doing significantly worse compared to last week's evaluation and even my valuation the week before. She actually has bone noted on the left wound margin in the region of undermining unfortunately. This was not noted during the last evaluation. I am concerned about the fact that this seems to be worse not better since our last visit with her. My biggest concern is that she's likely developing a worsening osteomyelitis. This was discussed with patient and her son today during the office visit. 05/03/17 on evaluation today patient actually appears to be doing rather well in regard to her sacral ulcer compared to last week's evaluation. She has been tolerating the dressing changes without complication. Fortunately even though we're not utilizing the Wound VAC she seems to be  making some strides in seeing the wound area overall improved quite dramatically in my pinion in just one weeks time. She also seems to be staying off of this to the point that I do not see any evidence of new injury which is excellent news. Whatever she's been doing in that regard over the past week I would want her to continue. Jessica [name], Jessica [name] ([id number]) did also review the results of her bone culture which revealed group B strep as the responsible organism. Again this is what's causing her osteomyelitis she does have  infectious disease appointment scheduled for 10/11/17 10/08/1898 valuation today patient appears to be doing better for the most part in regard to the sacral wound. Overall she is showing signs of having granulation of the bone which is good news. There is an area towards the left of the wound where she does seem to be showing some signs of opening this it would be due to additional pressure to the area unfortunately. There does not appear to be any evidence of infection spreading which is good news that do not see any evidence of significant purulent discharge which is also good news. 10/26/17 on evaluation today patient sacral ulcer actually appears to be very healthy and doing well in regard to the overall appearance of the wound. With that being said she does seem to be having some issues at this point in time with some shear/friction injury of the sacrum from where she was transported to [facility] for her infectious disease appointment. She has been placed on IV antibiotic therapy I will have to go back and find that note for review as I did not have access to that today. Nonetheless I will add the next visit be sure to document the exact antibiotics that she is taking at this point. Nonetheless I do believe the wound is doing better there's no bone exposure nothing that requires debridement in regard to the sacral wound. Likewise her left lateral lower extremity ulcer also appears to be doing significantly better. In general I feel like things are showing signs of improvement all around. 11/12/17 on evaluation today patient appears to be doing rather well in regard to her sacral wound. In general I do see signs of granulation at this point. Fortunately there does not appear to be any evidence of infection which is good news as well. In general overall happy with the appearance. With that being said I'll be see I would like for this to be progressing more rapidly as with the patient and her son but  nonetheless at least things do not appear to be doing worse. Her lower extremity ulcer is doing significantly better. 11/26/17 on evaluation today patient appears to be doing a little better in regard to the sacral wound especially in the peripheral of the wound bed. She actually did have an abrasion on the right gluteal region that's completely resolved to this point. Her lower extremity also appears to be for the most part completely resolved. Overall the sacrum itself seems to be the one thing that is still open and given her trouble. No fevers, chills, nausea, or vomiting noted at this time. She actually has an appointment next week with infectious disease for recheck to see how things are doing. She maybe switch to oral medications at that point. 12/09/17 on evaluation today patient actually appears to be doing much better in regard to her sacral wound. I do feel like she has made good progress at this time as  far as healing is concerned. In fact the wound looks better to me today than it has in quite some time. She is on oral antibiotic therapy and no longer on the IV antibiotics. She did see infectious disease last week. 12/24/17 on evaluation today patient's wounds actually appear to be doing decently well in regard to the sacral wound in particular. When it comes to her lower extremity ulcers both appear to be completely healed at this point in the one area where she had lived the rash has completely resolved at this time. Overall very pleased with the progress that has been made. Nonetheless the patient seems to be doing well in general. She still does have quite a bit of healing to do in regard to the sacral wound but I feel like she is making progress. 01/07/18 on evaluation today patient actually appears to be doing excellent today regard to her sacral wound. The granular quality in the base of the wound is excellent and shown signs of good improvement there's no evidence of contusion nor  deep tissue injury and the periwound actually appears to be doing well. In general I'm very pleased with the overall appearance. 02/04/18 on evaluation today patient's wound actually appears to be doing decently well as far as the granulation is concerned. She has been tolerating the dressing changes without complication. Right now we are still using the Wound VAC. Nonetheless overall there apparently is some cater to the wound and this is concerning simply for the fact that she could be developing a soft tissue infection again. She's had this happen in the past and at that time responded very well to the antibiotics. Nonetheless I don't think I would likely put her on anything prophylactically but rather wait for the results of the culture to return. 02/18/18 on evaluation today patient actually appears to be doing fairly well. She has been tolerating the dressing changes. And the Wound VAC seems to be doing well. Overall I'm very pleased with how things have improved over the past couple weeks since I last saw her. Fortunately there does not appear to be any evidence of overt infection at this time which is good news. She has been taking the antibiotics without complication. Specifically that is the Bactrim DS which was prescribed on 02/08/18. 03/18/18 on evaluation today patient's wound actually does have quite a bit of odor noted compared to previous. With that Jessica [name], Jessica [name]. ([id number]) being said the wound itself overall does not appear to be too significant or worse. There is no event noted in the wound bed which is good news. With that being said the patient has been worried as the home health nurse stated that she thought she saw bone in the base of the wound. Nonetheless there's a little bit of bruising along the left lateral border of the wound bed which again does not appear to be terrible we have noticed this before but other than this the majority the wound bed appears to be doing  excellent in my pinion. I am wondering if there may be some bacterial colonization. This could be leading to some of the odor that we are noting with the Wound VAC. 04/01/18 on evaluation today patient appears to actually be doing well in regard to the overall appearance of the wound itself. She also does not have any odor at this point in regard to the wound surface and I do believe the Bacons is extremely helpful in this regard. With that being said she  does have a little bit of deep tissue injury on the medial aspect of the wound. Again last time it was more lateral. The lateral seems to have improved quite well. Nonetheless she also notes by way of her son that this has been very macerated in the past several weeks since I last saw her. Though things overall seem to be doing better I still think that the Wound VAC may help with managing her fluid better I'm wondering if we can attempt a gauze Wound VAC my hope is that this will be able to manage the moisture control as well as continuing with managing the odor of the wound and bacterial load since the Dakin s has done excellent in helping in that regard. 04/15/17 on evaluation today patient actually appears to be doing much better in regard to the overall appearance of her sacral wound. I do believe that the gauze Vac has been of benefit for her. Overall she is tolerating this without complication which is also good news. There is no sign of injury to the sacral region at this point. 04/29/18 on evaluation today patient appears to be doing well in regard to her sacral ulcer. She sure signs of good granulation and the Gauze VAC is doing very well for her. Overall I'm pleased in this regard. The biggest issue is that the time from Friday till Monday seems to be quite long for her as far as the amount of time to keep the Wound VAC sealed. For that reason her son wonders if he can take this off on Sunday morning in order to give her day of just wet to  dry dressing's with the Dakin's solution I think this would be okay. 05/13/18 on evaluation today patient appears to be doing okay in regard to her sacral ulcer. There does not appear to be any evidence of overt infection at this time which is good news. In general the drainage and moisture appears to be significantly improved this was after the pressure on the Wound VAC was increased after they contacted our office. With that being said a lot of the dark tissue around the edge of the wound has improved in the maceration is significantly better this seems to be managing much better. Fortunately there is no signs of infection at this time. Overall the patient is doing quite well. The wound measurements are not significantly smaller today although they were over the past two visits we will see how things go. 05/27/18 on evaluation today patient's sacral wound actually appears to be doing fairly well at this point. She does have a deep tissue injury yet again on the medial aspect of the wound which is something that we seem to be seeing often. There is some question as to whether or not this is actually being caused by the Wound VAC bunching up and then her somewhat laying on it even though they are attempting to offload this as best as possible. Patient son states that overall they do the best they can trying offload her and I definitely believe him. I may want to see about putting the Wound VAC on hold for a couple weeks and see if this happens just with standard dressing changes. 06/10/18 on evaluation today patient actually appears to be doing better in regard to her sacral wound. Overall there's no signs of infection, no odor, and no significant skin breakdown around the edge of the wound I feel like this is done much better 50 Dakin s soaked  gauze dressing as opposed to legalizing anything such as the Wound VAC. I believe that we discontinue the Wound VAC as of today. 06/24/18 on evaluation today  patient actually appears to be doing well in regard to her sacral ulcer. There's no signs of infection, no odor, and in my pinion no concerns at all. I feel like she is making good progress as far as I'm concerned. 07/12/18 on evaluation today patient appears to be doing very well in regard to her sacral ulcer. She has been tolerating the dressing changes without complication. Fortunately there does not appear to be any evidence of active infection at this time she is doing excellent as far as I'm concerned at this point. 08/11/18 on evaluation today patient's sacral wound actually appears to be doing quite well at this point. The main issue I see is that she continues to have some maceration and skin breakdown due to moisture over the right periwound location unfortunately. We've investigated the possibility of using zinc oxide before but have not utilized it simply due to the fact that she continues to state. Her son anyways that she is allergic to this. They have not tried it recently and may be completely unrelated to the reaction she had before. Nonetheless this may be something you want to attempt will discuss that in the plan. Patient History Jessica [name], Jessica [name] ([id number]) Information obtained from Patient. Family History No family history of Cancer, Diabetes, Heart Disease. Social History Never smoker, Marital Status - Married. Review of Systems (ROS) Constitutional Symptoms (General Health) Denies complaints or symptoms of Fatigue, Fever, Chills, Marked Weight Change. Respiratory Denies complaints or symptoms of Chronic or frequent coughs, Shortness of Breath. Cardiovascular Denies complaints or symptoms of Chest pain, LE edema. Psychiatric Denies complaints or symptoms of Anxiety, Claustrophobia. Objective Constitutional Well-nourished and well-hydrated in no acute distress. Vitals Time Taken: 12:40 PM, Height: 70 in, Temperature: 98.1 F, Pulse: 69 bpm, Respiratory Rate: 18  breaths/min, Blood Pressure: 129/61 mmHg. Respiratory normal breathing without difficulty. clear to auscultation bilaterally. Cardiovascular regular rate and rhythm with normal S1, S2. Psychiatric this patient is able to make decisions and demonstrates good insight into disease process. Alert and Oriented x 3. pleasant and cooperative. General Notes: Patient's wound bed actually showed signs of good granulation there is no evidence of injury such as pressure injury at this point. The muscle maceration skin breakdown along the right edge of the periwound which I think is moisture related. I do think that think outside could be beneficial in this regard. Otherwise everything appears to be doing well. Integumentary (Hair, Skin) Wound #1 status is Open. Original cause of wound was Pressure Injury. The wound is located on the Medial Sacrum. The wound measures 7.4cm length x 4.5cm width x 3cm depth; 26.154cm^2 area and 78.461cm^3 volume. There is muscle, Fat Layer (Subcutaneous Tissue) Exposed, and fascia exposed. There is no tunneling noted, however, there is undermining starting at 7:00 and ending at 1:00 with a maximum distance of 4cm. There is a medium amount of sanguinous drainage noted. Foul odor after cleansing was noted. The wound margin is distinct with the outline attached to the wound base. There is large (67-100%) red, pink, hyper - granulation within the wound bed. There is a small (1-33%) amount of necrotic tissue within the wound bed including Adherent Slough. The periwound skin appearance exhibited: Scarring, Rubor, Erythema. The periwound skin appearance did not exhibit: Callus, Crepitus, Excoriation, Induration, Rash, Dry/Scaly, Maceration, Jessica [name], Jessica [name]. ([id number]) Blanche, Cyanosis, Ecchymosis,  Hemosiderin Staining, Mottled, Pallor. The surrounding wound skin color is noted with erythema which is circumferential. Periwound temperature was noted as No Abnormality.  The periwound has tenderness on palpation. 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 Skin Barriers/Peri-Wound Care: Wound #1 Medial Sacrum: Skin Prep Primary Wound Dressing: Wound #1 Medial Sacrum: Pack wound with: - Dakin's soaked gauze Secondary Dressing: Wound #1 Medial Sacrum: ABD pad - secure with tape Dressing Change Frequency: Wound #1 Medial Sacrum: Change dressing twice daily. Follow-up Appointments: Wound #1 Medial Sacrum: Return Appointment in 3 weeks. Off-Loading: Wound #1 Medial Sacrum: Turn and reposition every 2 hours Additional Orders / Instructions: Wound #1 Medial Sacrum: Increase protein intake. Home Health: Wound #1 Medial Sacrum: Initiate [facility] may visit PRN to address patient s wound care needs. FACE TO FACE ENCOUNTER: MEDICARE and MEDICAID PATIENTS: I certify that this patient is under my care and that I Jessica BradyOVERMAN, Natasa W. ([id number]) had a face-to-face encounter that meets the physician face-to-face encounter requirements with this patient on this date. The encounter with the patient was in whole or in part for the following MEDICAL CONDITION: (primary reason for Home Healthcare) MEDICAL NECESSITY: I certify, that based on my findings, NURSING services are a medically necessary home health service. HOME BOUND STATUS: I certify that my clinical findings support that this patient is homebound (i.e., Due to illness or injury,  pt requires aid of supportive devices such as crutches, cane, wheelchairs, walkers, the use of special transportation or the assistance of another person to leave their place of residence. There is a normal inability to leave the home and doing so requires considerable and taxing effort. Other absences are for medical reasons / religious services and are infrequent or of short duration when for other reasons). If current dressing causes regression in wound condition, may D/C ordered dressing product/s and apply Normal Saline Moist Dressing daily until next Wound Healing Center / Other MD appointment. Notify Wound Healing Center of regression in wound condition at [phone]. Please direct any NON-WOUND related issues/requests for orders to patient's Primary Care Physician The following medication(s) was prescribed: triamcinolone acetonide topical 0.1 % cream 0 cream topical applied to irritated region no more than 2 weeks at a time as needed starting 08/09/2018 In regard to the zinc I'm not sure that we really know for certain that the patient is allergic to the zinc oxide. With that being said I think that the best thing to do may be to test a very small area at the 12 o'clock location the periwound where the patient has no irritation or problems at this point. If she develops no allergy or sensitivity at the site then I would suggest that we go ahead and use the zinc oxide around the entire periwound region in order to ensure that we protected from moisture related damage. The patient and her son are in agreement with this plan. We will subsequently see were things stand at follow-up. If anything changes worsens meantime they will let me know. Please see above for specific wound  care orders. We will see patient for re-evaluation in 3 week(s) here in the clinic. If anything worsens or changes patient will contact our office for additional recommendations. Electronic Signature(s) Signed:  08/11/2018 3:36:10 PM By: Lenda Kelp PA-C Entered By: Lenda Kelp on 08/11/2018 15:25:55 Jessica [name] ([id number]) -------------------------------------------------------------------------------- ROS/PFSH Details [name]. Date of Service: 08/11/2018 12:30 PM Medical Record Number: [id number] Patient Account Number: [id number] Date of Birth/Sex: 1946/01/06 (73 y.o. F) Treating RN: Huel Coventry Primary Care Provider: Annita Brod Other Clinician: Referring Provider: Annita Brod Treating Provider/Extender: STONE III, Galilee Pierron Weeks in Treatment: 62 Information Obtained From Patient Constitutional Symptoms (General Health) Complaints and Symptoms: Negative for: Fatigue; Fever; Chills; Marked Weight Change Respiratory Complaints and Symptoms: Negative for: Chronic or frequent coughs; Shortness of Breath Cardiovascular Complaints and Symptoms: Negative for: Chest pain; LE edema Psychiatric Complaints and Symptoms: Negative for: Anxiety; Claustrophobia Immunizations Pneumococcal Vaccine: Received Pneumococcal Vaccination: No Implantable Devices No devices added Family and Social History Cancer: No; Diabetes: No; Heart Disease: No; Never smoker; Marital Status - Married Physician Affirmation I have reviewed and agree with the above information. Electronic Signature(s) Signed: 08/11/2018 3:36:10 PM By: Lenda Kelp PA-C Signed: 08/11/2018 4:37:26 PM By: Elliot Gurney BSN, RN, CWS, Kim RN, BSN Entered By: Lenda Kelp on 08/11/2018 14:57:05 Jessica [name] ([id number]) -------------------------------------------------------------------------------- SuperBill Details [name]. Date of Service: 08/11/2018 Medical Record Number: [id number] Patient Account Number: [id number] Date of Birth/Sex: 09/19/1945 (73 y.o. F) Treating RN: Huel Coventry Primary Care Provider: Annita Brod Other Clinician: Referring Provider: Annita Brod Treating Provider/Extender: Linwood Dibbles, Safia Panzer Weeks in Treatment: 63 Diagnosis Coding ICD-10 Codes Code Description L89.154 Pressure ulcer of sacral region, stage 4 M48.00 Spinal stenosis, site unspecified R54 Age-related physical debility G89.4 Chronic pain syndrome E78.2 Mixed hyperlipidemia L89.93 Pressure ulcer of unspecified site, stage 3 Facility Procedures CPT4 Code: 62952841 Description: 99213 - WOUND CARE VISIT-LEV 3 EST PT Modifier: Quantity: 1 Physician Procedures CPT4 Code: 3244010 Description: 99214 - WC PHYS LEVEL 4 - EST PT ICD-10 Diagnosis Description L89.154 Pressure ulcer of sacral region, stage 4 M48.00 Spinal stenosis, site unspecified G89.4 Chronic pain syndrome R54 Age-related physical debility Modifier: Quantity: 1 Electronic Signature(s) Signed: 08/11/2018 3:36:10 PM By: Lenda Kelp PA-C Entered By: Lenda Kelp on 08/11/2018 15:26:08

## 2018-09-06 ENCOUNTER — Ambulatory Visit: Payer: Medicare Other | Admitting: Physician Assistant

## 2018-09-08 ENCOUNTER — Ambulatory Visit: Payer: Medicare Other | Admitting: Physician Assistant

## 2018-09-26 ENCOUNTER — Ambulatory Visit: Payer: Medicare Other | Admitting: Physician Assistant

## 2018-10-04 ENCOUNTER — Encounter: Payer: Medicare Other | Attending: Physician Assistant | Admitting: Physician Assistant

## 2018-10-04 ENCOUNTER — Other Ambulatory Visit: Payer: Self-pay

## 2018-10-04 DIAGNOSIS — R54 Age-related physical debility: Secondary | ICD-10-CM | POA: Insufficient documentation

## 2018-10-04 DIAGNOSIS — G894 Chronic pain syndrome: Secondary | ICD-10-CM | POA: Diagnosis not present

## 2018-10-04 DIAGNOSIS — L89154 Pressure ulcer of sacral region, stage 4: Secondary | ICD-10-CM | POA: Insufficient documentation

## 2018-10-07 NOTE — Progress Notes (Signed)
Jessica [name], [name] ([id number]) Visit Report for 10/04/2018 Chief Complaint Document Details [name], [name] Date of Service: 10/04/2018 10:45 AM Medical Record Number: [id number] Patient Account Number: [id number] Date of Birth/Sex: 12/09/1945 [age] y.o. F) Treating RN: Curtis Sitesorthy, Joanna Primary Care Provider: Annita BrodASENSO, PHILIP Other Clinician: Referring Provider: Annita BrodASENSO, PHILIP Treating Provider/Extender: Linwood DibblesSTONE III, Yittel Emrich Weeks in Treatment: 70 Information Obtained from: Patient Chief Complaint she is here for evaluation of a sacral ulcer and bilateral lower extremity ulcers Electronic Signature(s) Signed: 10/05/2018 10:45:49 PM By: Lenda KelpStone III, Vihana Kydd PA-C Entered By: Lenda KelpStone III, Lakeem Rozo on 10/04/2018 10:50:35 Jessica [name], [name] ([id number]) -------------------------------------------------------------------------------- HPI Details [name], [name] Date of Service: 10/04/2018 10:45 AM Medical Record Number: [id number] Patient Account Number: [id number] Date of Birth/Sex: 12/09/1945 [age] y.o. F) Treating RN: Curtis Sitesorthy, Joanna Primary Care Provider: Annita BrodASENSO, PHILIP Other Clinician: Referring Provider: Annita BrodASENSO, PHILIP Treating Provider/Extender: Linwood DibblesSTONE III, Philip Eckersley Weeks in Treatment: 70 History of Present Illness HPI Description: 05/27/17-she is here in initial evaluation for a left-sided sacral stage IV pressure ulcer and bilateral lower extremity, lateral aspect, unstageable pressure ulcers. She is accompanied by her husband and her son, who are her primary caregivers. She is bedbound secondary to spinal stenosis. According to her son and husband she was hospitalized from 10/5-11/1 with healthcare associated pneumonia and altered mental status. During her hospitalization she was intubated, extubated on 10/27. She was discharged with Foley catheter and follow up with urology. According to her son and spouse she developed the sacral ulcer during hospitalization. Home health has  been applying Santyl daily. She does have a low air loss mattress and is repositioned every 2-3 hours per her family's report. According to the son and spouse she had an appointment with urology on 12/18 and during that appointment developed discoloration to her bilateral lower extremities which ultimately developed into unstageable pressure ulcers to the lateral aspects of her bilateral lower extremities. There has been no topical treatment applied to these. She continues to have home health. There is no concerns expressed regarding dietary intake, stating she eats 3 meals a day, eating was provided; she is supplemented with boost with protein. 06/03/17-she is here in follow-up evaluation for sacral and bilateral lower extremity ulcers. Plain film x-ray done today reveals no distraction to the sacrum or coccyx, no visible abnormalities. Home health has ordered the negative pressure wound system but it has not been initiated. We will continue with Hydrofera Blue until initiation of negative pressure wound system and continue with Santyl to bilateral lower extremity ulcers. Follow-up next week 06/10/17-she is here in follow-up evaluation for sacral and bilateral lower extremity ulcers. The wound VAC will be available tomorrow per home health. We will initiate wound VAC therapy to the sacral ulcer 3 times weekly (Thursday, Saturday/Sunday, Tuesday). We will continue with Santyl to the lower extremity ulcers. The patient's son is checking into [facility] health [facility], with the understanding that if VAC changes cannot be performed over the weekend he will need to change his mother's appointments to Monday, Wednesday or Friday. The sacral ulcer clinically does not appear infected but there has been a change in the amount of drainage acutely, there is no significant amount of devitalized tissue, there is no malodor. Wound culture was obtained to evaluate for occult infection we  will hold off on antibiotic therapy until sensitivities are resulted. 06/18/17 on evaluation today patient appears to be doing fairly well in my opinion although this is the first time I  have seen this patient she has been previously evaluated by Tacey RuizLeah here in our office. She is going to be switching to Fridays to see me due to the Wound VAC schedule being changed on Monday, Wednesday, and Friday. Subsequently she seems to be doing fairly well with the Wound VAC. Her son who was present during evaluation today states that he somewhat stresses of the Wound VAC in making sure that it was functioning appropriately. With that being said everything seems to be working well he knows what settings on the [facility] HVAC itself to look at and ensure that it is functioning properly. Overall the wound appears to be nice and clean there is no need for debridement today. She has no discomfort in her bilateral lower extremity ulcers also appear to be improving based on measurements and what this honest tell me about the overall appearance. 07/02/17 on evaluation today I noted in patients wound bed that was actually an odor that had not previously been noted. Subsequently there was also a small area of bone that was not previously noted during my last evaluation. This did appear to be necrotic and was being somewhat forced out by the body around the region of granulation. She does not have any pain which is good news. Nonetheless the overall appearance of the ulcer is making me concerned for the patient having developed osteomyelitis. Currently she is not been on any antibiotics and the Wound VAC has been doing fairly well in general. With that being said I do not think we need to continue the Wound VAC if she potentially has a bone infection. At least not until it is properly addressed with antibiotics. 07/09/17 on evaluation today patient appears to actually be doing very well in regard to her sacral ulcer compared to last  week. There is actually no exposed bone at this point. 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 With that being said still I do believe she needs to have a referral to infectious disease due to the osteomyelitis I am gonna recommend that she continue with the doxycycline based on the culture results which showed Eikenella Corrodens as the Jessica [name], [name] ([id number]) organism identified that tetracycline should work for. She does not seem to be having any discomfort whatsoever at this point. 07/16/17 on evaluation today patient actually appears to be doing rather well in regard to her sacral wound. I think that the original wound site actually appears much better than previously noted. With that being said she does have a new superficial injury on the right sacral region which appears to be due to according to the sign transport that occurred for her MRI unfortunately. Fortunately this is not too deep and I do think it can be managed but it was a new area that was not previously noted. Otherwise she has been tolerating the Dakinos soaked gauze packing without complication. 07/30/17 on evaluation today patient presents for reevaluation concerning her sacral wound. She has been tolerating the dressing changes without complication. With that being said her wound is doing so well at this point that I think she may be at the point where we could reinitiate the Wound VAC currently and hopefully see good results from this. I'm very pleased with how she has responded to the Dakinos soaked gauze packing. 08/13/17 evaluation today patient appears to be doing excellent in  regard to her lower extremity ulcer this seems to be cleaning up very nicely. In regard to the sacral ulcer the area of trauma on the right lateral portion of the wound actually appears  to be much better than previously noted during the last evaluation. The word has definitely filled in and the Wound VAC appears to be helpful I do believe. Overall I'm very pleased with how things seem to be progressing at this time. Patient likewise is also happy that things are doing well. 08/27/17 on evaluation today patient presents for follow-up concerning her ongoing issues with her sacral ulcer and lower extremity ulcer. Fortunately she has been tolerating the dressing changes well complication the Wound VAC in general seems to have done very well up to this point. She does not have any evidence of infection which is good news. She does have some dark discoloration in central portion of the wound which is troublesome for the possibility of pressure injury to the site it sounds like her prior air mattress was not functioning properly her son has just bought a new one for her this is doing much better. Otherwise things seem to be progressing nicely. 09/10/17 on evaluation today patient presents for follow-up concerning her sacral wound and lower extremity ulcer. She has been tolerating the switch of her dressing changes to the Dakinos soaked gauze dressing very well in regard to the sacrum. The left lateral lower extremity ulcer appears to have a new area open just distal to the one that we have been treating with Santyl and this is new since her last visit. There does not appear to be any evidence of infection which is good news. With that being said there's a lot of maceration here at the site and I feel this is likely due to the switch and the dressings it appears that due to the sticking of the dressings it will switch to utilizing a telfa pad over the Santyl. I think this caused a lot of drainage to be collected and situated in the region just under the dressing which has led to this causing which duration of breakdown. Overall there does not appear to be any significant pain which is good  news. Of note I did actually have a conversation with the radiologist who is an interventional radiologist with [facility]. 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 With that being said there does not appear to be any rush to get this done according to the interventional radiologist whom I spoke with. Therefore after discussion it was determined that there gonna wait about six months prior to considering the procedure to get time for hopefully the sacral wound to heal in the interim to at least some degree. 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 According to nursing staff these are all improved, the negative pressure wound therapy system was put on hold last week. We will resume negative pressure wound therapy today, continue with Santyl to the lower extremity and she will follow-up next week. 09/24/17 on evaluation today unfortunately the patient's wound appears to be doing significantly worse compared to last week's evaluation and even my valuation the week before. She actually has bone noted on the left wound margin in the region of undermining unfortunately. This was not noted during the last evaluation. I am concerned about the fact that this seems to be worse not better since  our last visit with her. My biggest concern is that she's likely developing a worsening osteomyelitis. This was discussed with patient and her son today during the office visit. 05/03/17 on evaluation today patient actually appears to be doing rather well in regard to her sacral ulcer compared to last week's evaluation. She has been tolerating the dressing changes without complication. Fortunately even though we're not utilizing the Wound VAC she seems to be making some strides in seeing the wound area overall improved quite dramatically in my pinion in just one weeks time. She also seems to  be staying off of this to the point that I do not see any evidence of new injury which is excellent news. Whatever she's been doing in that regard over the past week I would want her to continue. I did also review the results of her bone culture which revealed group B strep as the responsible organism. Again this is what's causing her osteomyelitis she does have infectious disease appointment scheduled for 10/11/17 10/08/1898 valuation today patient appears to be doing better for the most part in regard to the sacral wound. Overall she is showing signs of having granulation of the bone which is good news. There is an area towards the left of the wound where she does seem to be showing some signs of opening this it would be due to additional pressure to the area unfortunately. Jessica [name], BORGES. ([id number]) There does not appear to be any evidence of infection spreading which is good news that do not see any evidence of significant purulent discharge which is also good news. 10/26/17 on evaluation today patient sacral ulcer actually appears to be very healthy and doing well in regard to the overall appearance of the wound. With that being said she does seem to be having some issues at this point in time with some shear/friction injury of the sacrum from where she was transported to [facility] for her infectious disease appointment. She has been placed on IV antibiotic therapy I will have to go back and find that note for review as I did not have access to that today. Nonetheless I will add the next visit be sure to document the exact antibiotics that she is taking at this point. Nonetheless I do believe the wound is doing better there's no bone exposure nothing that requires debridement in regard to the sacral wound. Likewise her left lateral lower extremity ulcer also appears to be doing significantly better. In general I feel like things are showing signs of improvement all around. 11/12/17 on  evaluation today patient appears to be doing rather well in regard to her sacral wound. In general I do see signs of granulation at this point. Fortunately there does not appear to be any evidence of infection which is good news as well. In general overall happy with the appearance. With that being said I'll be see I would like for this to be progressing more rapidly as with the patient and her son but nonetheless at least things do not appear to be doing worse. Her lower extremity ulcer is doing significantly better. 11/26/17 on evaluation today patient appears to be doing a little better in regard to the sacral wound especially in the peripheral of the wound bed. She actually did have an abrasion on the right gluteal region that's completely resolved to this point. Her lower extremity also appears to be for the most part completely resolved. Overall the sacrum itself seems to be the one thing that is  still open and given her trouble. No fevers, chills, nausea, or vomiting noted at this time. She actually has an appointment next week with infectious disease for recheck to see how things are doing. She maybe switch to oral medications at that point. 12/09/17 on evaluation today patient actually appears to be doing much better in regard to her sacral wound. I do feel like she has made good progress at this time as far as healing is concerned. In fact the wound looks better to me today than it has in quite some time. She is on oral antibiotic therapy and no longer on the IV antibiotics. She did see infectious disease last week. 12/24/17 on evaluation today patient's wounds actually appear to be doing decently well in regard to the sacral wound in particular. When it comes to her lower extremity ulcers both appear to be completely healed at this point in the one area where she had lived the rash has completely resolved at this time. Overall very pleased with the progress that has been made. Nonetheless  the patient seems to be doing well in general. She still does have quite a bit of healing to do in regard to the sacral wound but I feel like she is making progress. 01/07/18 on evaluation today patient actually appears to be doing excellent today regard to her sacral wound. The granular quality in the base of the wound is excellent and shown signs of good improvement there's no evidence of contusion nor deep tissue injury and the periwound actually appears to be doing well. In general I'm very pleased with the overall appearance. 02/04/18 on evaluation today patient's wound actually appears to be doing decently well as far as the granulation is concerned. She has been tolerating the dressing changes without complication. Right now we are still using the Wound VAC. Nonetheless overall there apparently is some cater to the wound and this is concerning simply for the fact that she could be developing a soft tissue infection again. She's had this happen in the past and at that time responded very well to the antibiotics. Nonetheless I don't think I would likely put her on anything prophylactically but rather wait for the results of the culture to return. 02/18/18 on evaluation today patient actually appears to be doing fairly well. She has been tolerating the dressing changes. And the Wound VAC seems to be doing well. Overall I'm very pleased with how things have improved over the past couple weeks since I last saw her. Fortunately there does not appear to be any evidence of overt infection at this time which is good news. She has been taking the antibiotics without complication. Specifically that is the Bactrim DS which was prescribed on 02/08/18. 03/18/18 on evaluation today patient's wound actually does have quite a bit of odor noted compared to previous. With that being said the wound itself overall does not appear to be too significant or worse. There is no event noted in the wound bed which is good  news. With that being said the patient has been worried as the home health nurse stated that she thought she saw bone in the base of the wound. Nonetheless there's a little bit of bruising along the left lateral border of the wound bed which again does not appear to be terrible we have noticed this before but other than this the majority the wound bed appears to be doing excellent in my pinion. I am wondering if there may be some bacterial colonization. This  could be leading to some of the odor that we are noting with the Wound VAC. Jessica [name], [name] ([id number]) 04/01/18 on evaluation today patient appears to actually be doing well in regard to the overall appearance of the wound itself. She also does not have any odor at this point in regard to the wound surface and I do believe the Bacons is extremely helpful in this regard. With that being said she does have a little bit of deep tissue injury on the medial aspect of the wound. Again last time it was more lateral. The lateral seems to have improved quite well. Nonetheless she also notes by way of her son that this has been very macerated in the past several weeks since I last saw her. Though things overall seem to be doing better I still think that the Wound VAC may help with managing her fluid better I'm wondering if we can attempt a gauze Wound VAC my hope is that this will be able to manage the moisture control as well as continuing with managing the odor of the wound and bacterial load since the Dakinos has done excellent in helping in that regard. 04/15/17 on evaluation today patient actually appears to be doing much better in regard to the overall appearance of her sacral wound. I do believe that the gauze Vac has been of benefit for her. Overall she is tolerating this without complication which is also good news. There is no sign of injury to the sacral region at this point. 04/29/18 on evaluation today patient appears to be doing well in  regard to her sacral ulcer. She sure signs of good granulation and the Gauze VAC is doing very well for her. Overall I'm pleased in this regard. The biggest issue is that the time from Friday till Monday seems to be quite long for her as far as the amount of time to keep the Wound VAC sealed. For that reason her son wonders if he can take this off on Sunday morning in order to give her day of just wet to dry dressing's with the Dakin's solution I think this would be okay. 05/13/18 on evaluation today patient appears to be doing okay in regard to her sacral ulcer. There does not appear to be any evidence of overt infection at this time which is good news. In general the drainage and moisture appears to be significantly improved this was after the pressure on the Wound VAC was increased after they contacted our office. With that being said a lot of the dark tissue around the edge of the wound has improved in the maceration is significantly better this seems to be managing much better. Fortunately there is no signs of infection at this time. Overall the patient is doing quite well. The wound measurements are not significantly smaller today although they were over the past two visits we will see how things go. 05/27/18 on evaluation today patient's sacral wound actually appears to be doing fairly well at this point. She does have a deep tissue injury yet again on the medial aspect of the wound which is something that we seem to be seeing often. There is some question as to whether or not this is actually being caused by the Wound VAC bunching up and then her somewhat laying on it even though they are attempting to offload this as best as possible. Patient son states that overall they do the best they can trying offload her and I definitely believe him. I  may want to see about putting the Wound VAC on hold for a couple weeks and see if this happens just with standard dressing changes. 06/10/18 on evaluation  today patient actually appears to be doing better in regard to her sacral wound. Overall there's no signs of infection, no odor, and no significant skin breakdown around the edge of the wound I feel like this is done much better 50 Dakinos soaked gauze dressing as opposed to legalizing anything such as the Wound VAC. I believe that we discontinue the Wound VAC as of today. 06/24/18 on evaluation today patient actually appears to be doing well in regard to her sacral ulcer. There's no signs of infection, no odor, and in my pinion no concerns at all. I feel like she is making good progress as far as I'm concerned. 07/12/18 on evaluation today patient appears to be doing very well in regard to her sacral ulcer. She has been tolerating the dressing changes without complication. Fortunately there does not appear to be any evidence of active infection at this time she is doing excellent as far as I'm concerned at this point. 08/11/18 on evaluation today patient's sacral wound actually appears to be doing quite well at this point. The main issue I see is that she continues to have some maceration and skin breakdown due to moisture over the right periwound location unfortunately. We've investigated the possibility of using zinc oxide before but have not utilized it simply due to the fact that she continues to state. Her son anyways that she is allergic to this. They have not tried it recently and may be completely unrelated to the reaction she had before. Nonetheless this may be something you want to attempt will discuss that in the plan. 10/04/18 on evaluation today patient actually appears to be doing quite well in regard to her sacral wound. It's been roughly 2 months since I last seen her in that time her wound has definitely made progress. This is very slow but again nonetheless does seem to be trending in the correct direction. Overall very pleased in this regard. No fevers, chills, nausea, or  vomiting noted at this time. Electronic Signature(s) Signed: 10/05/2018 10:45:49 PM By: Walker Shadow ([id number]) Entered By: Worthy Keeler on 10/05/2018 22:33:08 Jessica [name] ([id number]) -------------------------------------------------------------------------------- Physical Exam Details [name]. Date of Service: 10/04/2018 10:45 AM Medical Record Number: [id number] Patient Account Number: [id number] Date of Birth/Sex: November 04, 1945 (73 y.o. F) Treating RN: Montey Hora Primary Care Provider: Clovia Cuff Other Clinician: Referring Provider: Clovia Cuff Treating Provider/Extender: STONE III, Pecolia Marando Weeks in Treatment: 35 Constitutional Well-nourished and well-hydrated in no acute distress. Respiratory normal breathing without difficulty. clear to auscultation bilaterally. Cardiovascular regular rate and rhythm with normal S1, S2. Psychiatric this patient is able to make decisions and demonstrates good insight into disease process. Alert and Oriented x 3. pleasant and cooperative. Notes Patient's wound bed currently showed signs of good granulation at this time. Fortunately there does not appear to be any evidence of active infection which is good news. Overall very pleased with the progress that has been made over the two months since I last saw her. No sharp debridement was necessary. Electronic Signature(s) Signed: 10/05/2018 10:45:49 PM By: Worthy Keeler PA-C Entered By: Worthy Keeler on 10/05/2018 22:33:32 Jessica [name] ([id number]) -------------------------------------------------------------------------------- Physician Orders Details [name]. Date of Service: 10/04/2018 10:45 AM Medical Record Number: [id number] Patient Account Number: [id number]  Date of Birth/Sex: 1January 24, 1947 6(73 y.o. F) Treating RN: Curtis Sitesorthy, Joanna Primary Care Provider: Annita BrodASENSO, PHILIP Other  Clinician: Referring Provider: Annita BrodASENSO, PHILIP Treating Provider/Extender: Linwood DibblesSTONE III, Madellyn Denio Weeks in Treatment: 3470 Verbal / Phone Orders: No Diagnosis Coding ICD-10 Coding Code Description L89.154 Pressure ulcer of sacral region, stage 4 M48.00 Spinal stenosis, site unspecified R54 Age-related physical debility G89.4 Chronic pain syndrome E78.2 Mixed hyperlipidemia L89.93 Pressure ulcer of unspecified site, stage 3 Wound Cleansing Wound #1 Medial Sacrum o Other: - please cleanse sacral wound with dakins moistened gauze - do not spray dakins on wound Anesthetic (add to Medication List) Wound #1 Medial Sacrum o Topical Lidocaine 4% cream applied to wound bed prior to debridement (In Clinic Only). Skin Barriers/Peri-Wound Care Wound #1 Medial Sacrum o Skin Prep Primary Wound Dressing Wound #1 Medial Sacrum o Pack wound with: - Dakin's soaked gauze Secondary Dressing Wound #1 Medial Sacrum o ABD pad - secure with tape Dressing Change Frequency Wound #1 Medial Sacrum o Change dressing twice daily. Follow-up Appointments Wound #1 Medial Sacrum o Return Appointment in 3 weeks. Off-Loading Wound #1 Medial Sacrum o Turn and reposition every 2 hours Jessica [name], [name] ([id number]) Additional Orders / Instructions Wound #1 Medial Sacrum o Increase protein intake. Home Health Wound #1 Medial Sacrum o Continue Home Health Visits - [facility] o Home Health Nurse may visit PRN to address patientos wound care needs. o FACE TO FACE ENCOUNTER: MEDICARE and MEDICAID PATIENTS: I certify that this patient is under my care and that I had a face-to-face encounter that meets the physician face-to-face encounter requirements with this patient on this date. The encounter with the patient was in whole or in part for the following MEDICAL CONDITION: (primary reason for Home Healthcare) MEDICAL NECESSITY: I certify, that based on my findings, NURSING services are a medically  necessary home health service. HOME BOUND STATUS: I certify that my clinical findings support that this patient is homebound (i.e., Due to illness or injury, pt requires aid of supportive devices such as crutches, cane, wheelchairs, walkers, the use of special transportation or the assistance of another person to leave their place of residence. There is a normal inability to leave the home and doing so requires considerable and taxing effort. Other absences are for medical reasons / religious services and are infrequent or of short duration when for other reasons). o If current dressing causes regression in wound condition, may D/C ordered dressing product/s and apply Normal Saline Moist Dressing daily until next Wound Healing Center / Other MD appointment. Notify Wound Healing Center of regression in wound condition at [phone]. o Please direct any NON-WOUND related issues/requests for orders to patient's Primary Care Physician Electronic Signature(s) Signed: 10/04/2018 4:25:12 PM By: Curtis Sitesorthy, Joanna Signed: 10/05/2018 10:45:49 PM By: Lenda KelpStone III, Valisa Karpel PA-C Entered By: Curtis Sitesorthy, Joanna on 10/04/2018 11:38:25 Jessica [name], Theresea W. ([id number]) -------------------------------------------------------------------------------- Problem List Details [name], Aarna W. Date of Service: 10/04/2018 10:45 AM Medical Record Number: [id number] Patient Account Number: [id number] Date of Birth/Sex: 1January 24, 1947 [age] y.o. F) Treating RN: Curtis Sitesorthy, Joanna Primary Care Provider: Annita BrodASENSO, PHILIP Other Clinician: Referring Provider: Annita BrodASENSO, PHILIP Treating Provider/Extender: Linwood DibblesSTONE III, Cashay Manganelli Weeks in Treatment: 70 Active Problems ICD-10 Evaluated Encounter Code Description Active Date Today Diagnosis L89.154 Pressure ulcer of sacral region, stage 4 05/27/2017 No Yes M48.00 Spinal stenosis, site unspecified 05/27/2017 No Yes R54 Age-related physical debility 05/27/2017 No Yes G89.4 Chronic pain  syndrome 05/27/2017 No Yes E78.2 Mixed hyperlipidemia 05/27/2017 No Yes L89.93 Pressure ulcer of unspecified site, stage  3 05/27/2017 No Yes Inactive Problems Resolved Problems Electronic Signature(s) Signed: 10/05/2018 10:45:49 PM By: Lenda KelpStone III, Maaliyah Adolph PA-C Entered By: Lenda KelpStone III, Sebastin Perlmutter on 10/04/2018 10:50:28 Jessica [name], Jernee W. ([id number]) -------------------------------------------------------------------------------- Progress Note Details [name], Rachel W. Date of Service: 10/04/2018 10:45 AM Medical Record Number: [id number] Patient Account Number: [id number] Date of Birth/Sex: 24-Jul-1945 [age] y.o. F) Treating RN: Curtis Sitesorthy, Joanna Primary Care Provider: Annita BrodASENSO, PHILIP Other Clinician: Referring Provider: Annita BrodASENSO, PHILIP Treating Provider/Extender: Linwood DibblesSTONE III, Decker Cogdell Weeks in Treatment: 70 Subjective Chief Complaint Information obtained from Patient she is here for evaluation of a sacral ulcer and bilateral lower extremity ulcers History of Present Illness (HPI) 05/27/17-she is here in initial evaluation for a left-sided sacral stage IV pressure ulcer and bilateral lower extremity, lateral aspect, unstageable pressure ulcers. She is accompanied by her husband and her son, who are her primary caregivers. She is bedbound secondary to spinal stenosis. According to her son and husband she was hospitalized from 10/5-11/1 with healthcare associated pneumonia and altered mental status. During her hospitalization she was intubated, extubated on 10/27. She was discharged with Foley catheter and follow up with urology. According to her son and spouse she developed the sacral ulcer during hospitalization. Home health has been applying Santyl daily. She does have a low air loss mattress and is repositioned every 2-3 hours per her family's report. According to the son and spouse she had an appointment with urology on 12/18 and during that appointment developed discoloration to her bilateral  lower extremities which ultimately developed into unstageable pressure ulcers to the lateral aspects of her bilateral lower extremities. There has been no topical treatment applied to these. She continues to have home health. There is no concerns expressed regarding dietary intake, stating she eats 3 meals a day, eating was provided; she is supplemented with boost with protein. 06/03/17-she is here in follow-up evaluation for sacral and bilateral lower extremity ulcers. Plain film x-ray done today reveals no distraction to the sacrum or coccyx, no visible abnormalities. Home health has ordered the negative pressure wound system but it has not been initiated. We will continue with Hydrofera Blue until initiation of negative pressure wound system and continue with Santyl to bilateral lower extremity ulcers. Follow-up next week 06/10/17-she is here in follow-up evaluation for sacral and bilateral lower extremity ulcers. The wound VAC will be available tomorrow per home health. We will initiate wound VAC therapy to the sacral ulcer 3 times weekly (Thursday, Saturday/Sunday, Tuesday). We will continue with Santyl to the lower extremity ulcers. The patient's son is checking [facility] with the understanding that if VAC changes cannot be performed over the weekend he will need to change his mother's appointments to Monday, Wednesday or Friday. The sacral ulcer clinically does not appear infected but there has been a change in the amount of drainage acutely, there is no significant amount of devitalized tissue, there is no malodor. Wound culture was obtained to evaluate for occult infection we will hold off on antibiotic therapy until sensitivities are resulted. 06/18/17 on evaluation today patient appears to be doing fairly well in my opinion although this is the first time I have seen this patient she has been previously evaluated by Tacey RuizLeah here in our office. She  is going to be switching to Fridays to see me due to the Wound VAC schedule being changed on Monday, Wednesday, and Friday. Subsequently she seems to be doing fairly well with the Wound VAC. Her son  who was present during evaluation today states that he somewhat stresses of the Wound VAC in making sure that it was functioning appropriately. With that being said everything seems to be working well he knows what settings on the [facility] itself to look at and ensure that it is functioning properly. Overall the wound appears to be nice and clean there is no need for debridement today. She has no discomfort in her bilateral lower extremity ulcers also appear to be improving based on measurements and what this honest tell me about the overall appearance. 07/02/17 on evaluation today I noted in patients wound bed that was actually an odor that had not previously been noted. Subsequently there was also a small area of bone that was not previously noted during my last evaluation. This did appear to be necrotic and was being somewhat forced out by the body around the region of granulation. She does not have any pain which is good news. Nonetheless the overall appearance of the ulcer is making me concerned for the patient having developed osteomyelitis. Currently she is not been on any antibiotics and the Wound VAC has been doing fairly well in general. With that being said I do not think we need to continue the Wound VAC if she potentially has a bone infection. At least not until it is properly addressed with antibiotics. Jessica [name], Jessica [name] ([id number]) 07/09/17 on evaluation today patient appears to actually be doing very well in regard to her sacral ulcer compared to last week. There is actually no exposed bone at this point. Her pathology report showed early signs of osteomyelitis which had explained to the patient son is definitely good news catching this early is often a key to getting it better without things  worsening. With that being said still I do believe she needs to have a referral to infectious disease due to the osteomyelitis I am gonna recommend that she continue with the doxycycline based on the culture results which showed Eikenella Corrodens as the organism identified that tetracycline should work for. She does not seem to be having any discomfort whatsoever at this point. 07/16/17 on evaluation today patient actually appears to be doing rather well in regard to her sacral wound. I think that the original wound site actually appears much better than previously noted. With that being said she does have a new superficial injury on the right sacral region which appears to be due to according to the sign transport that occurred for her MRI unfortunately. Fortunately this is not too deep and I do think it can be managed but it was a new area that was not previously noted. Otherwise she has been tolerating the Dakin s soaked gauze packing without complication. 07/30/17 on evaluation today patient presents for reevaluation concerning her sacral wound. She has been tolerating the dressing changes without complication. With that being said her wound is doing so well at this point that I think she may be at the point where we could reinitiate the Wound VAC currently and hopefully see good results from this. I'm very pleased with how she has responded to the [facility] s soaked gauze packing. 08/13/17 evaluation today patient appears to be doing excellent in regard to her lower extremity ulcer this seems to be cleaning up very nicely. In regard to the sacral ulcer the area of trauma on the right lateral portion of the wound actually appears to be much better than previously noted during the last evaluation. The word has definitely filled in  and the Wound VAC appears to be helpful I do believe. Overall I'm very pleased with how things seem to be progressing at this time. Patient likewise is also happy that things  are doing well. 08/27/17 on evaluation today patient presents for follow-up concerning her ongoing issues with her sacral ulcer and lower extremity ulcer. Fortunately she has been tolerating the dressing changes well complication the Wound VAC in general seems to have done very well up to this point. She does not have any evidence of infection which is good news. She does have some dark discoloration in central portion of the wound which is troublesome for the possibility of pressure injury to the site it sounds like her prior air mattress was not functioning properly her son has just bought a new one for her this is doing much better. Otherwise things seem to be progressing nicely. 09/10/17 on evaluation today patient presents for follow-up concerning her sacral wound and lower extremity ulcer. She has been tolerating the switch of her dressing changes to the Dakin s soaked gauze dressing very well in regard to the sacrum. The left lateral lower extremity ulcer appears to have a new area open just distal to the one that we have been treating with Santyl and this is new since her last visit. There does not appear to be any evidence of infection which is good news. With that being said there's a lot of maceration here at the site and I feel this is likely due to the switch and the dressings it appears that due to the sticking of the dressings it will switch to utilizing a telfa pad over the Santyl. I think this caused a lot of drainage to be collected and situated in the region just under the dressing which has led to this causing which duration of breakdown. Overall there does not appear to be any significant pain which is good news. Of note I did actually have a conversation with the radiologist who is an interventional radiologist with [facility]. 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 With that being said there  does not appear to be any rush to get this done according to the interventional radiologist whom I spoke with. Therefore after discussion it was determined that there gonna wait about six months prior to considering the procedure to get time for hopefully the sacral wound to heal in the interim to at least some degree. 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 According to nursing staff these are all improved, the negative pressure wound therapy system was put on hold last week. We will resume negative pressure wound therapy today, continue with Santyl to the lower extremity and she will follow-up next week. 09/24/17 on evaluation today unfortunately the patient's wound appears to be doing significantly worse compared to last week's evaluation and even my valuation the week before. She actually has bone noted on the left wound margin in the region of undermining unfortunately. This was not noted during the last evaluation. I am concerned about the fact that this seems to be worse not better since our last visit with her. My biggest concern is that she's likely developing a worsening osteomyelitis. This was discussed with patient and her son today during the office visit. 05/03/17 on evaluation today patient actually appears to be doing rather well in regard to her sacral ulcer compared to last  week's evaluation. She has been tolerating the dressing changes without complication. Fortunately even though we're not utilizing the Wound VAC she seems to be making some strides in seeing the wound area overall improved quite dramatically in my pinion in just one weeks time. She also seems to be staying off of this to the point that I do not see any evidence of new injury which is excellent news. Whatever she's been doing in that regard over the past week I would want her to continue. Jessica [name], Jessica [name] ([id number]) did also review the results of her bone  culture which revealed group B strep as the responsible organism. Again this is what's causing her osteomyelitis she does have infectious disease appointment scheduled for 10/11/17 10/08/1898 valuation today patient appears to be doing better for the most part in regard to the sacral wound. Overall she is showing signs of having granulation of the bone which is good news. There is an area towards the left of the wound where she does seem to be showing some signs of opening this it would be due to additional pressure to the area unfortunately. There does not appear to be any evidence of infection spreading which is good news that do not see any evidence of significant purulent discharge which is also good news. 10/26/17 on evaluation today patient sacral ulcer actually appears to be very healthy and doing well in regard to the overall appearance of the wound. With that being said she does seem to be having some issues at this point in time with some shear/friction injury of the sacrum from where she was transported to [facility] for her infectious disease appointment. She has been placed on IV antibiotic therapy I will have to go back and find that note for review as I did not have access to that today. Nonetheless I will add the next visit be sure to document the exact antibiotics that she is taking at this point. Nonetheless I do believe the wound is doing better there's no bone exposure nothing that requires debridement in regard to the sacral wound. Likewise her left lateral lower extremity ulcer also appears to be doing significantly better. In general I feel like things are showing signs of improvement all around. 11/12/17 on evaluation today patient appears to be doing rather well in regard to her sacral wound. In general I do see signs of granulation at this point. Fortunately there does not appear to be any evidence of infection which is good news as well. In general overall happy with the  appearance. With that being said I'll be see I would like for this to be progressing more rapidly as with the patient and her son but nonetheless at least things do not appear to be doing worse. Her lower extremity ulcer is doing significantly better. 11/26/17 on evaluation today patient appears to be doing a little better in regard to the sacral wound especially in the peripheral of the wound bed. She actually did have an abrasion on the right gluteal region that's completely resolved to this point. Her lower extremity also appears to be for the most part completely resolved. Overall the sacrum itself seems to be the one thing that is still open and given her trouble. No fevers, chills, nausea, or vomiting noted at this time. She actually has an appointment next week with infectious disease for recheck to see how things are doing. She maybe switch to oral medications at that point. 12/09/17 on evaluation today patient actually appears  to be doing much better in regard to her sacral wound. I do feel like she has made good progress at this time as far as healing is concerned. In fact the wound looks better to me today than it has in quite some time. She is on oral antibiotic therapy and no longer on the IV antibiotics. She did see infectious disease last week. 12/24/17 on evaluation today patient's wounds actually appear to be doing decently well in regard to the sacral wound in particular. When it comes to her lower extremity ulcers both appear to be completely healed at this point in the one area where she had lived the rash has completely resolved at this time. Overall very pleased with the progress that has been made. Nonetheless the patient seems to be doing well in general. She still does have quite a bit of healing to do in regard to the sacral wound but I feel like she is making progress. 01/07/18 on evaluation today patient actually appears to be doing excellent today regard to her sacral  wound. The granular quality in the base of the wound is excellent and shown signs of good improvement there's no evidence of contusion nor deep tissue injury and the periwound actually appears to be doing well. In general I'm very pleased with the overall appearance. 02/04/18 on evaluation today patient's wound actually appears to be doing decently well as far as the granulation is concerned. She has been tolerating the dressing changes without complication. Right now we are still using the Wound VAC. Nonetheless overall there apparently is some cater to the wound and this is concerning simply for the fact that she could be developing a soft tissue infection again. She's had this happen in the past and at that time responded very well to the antibiotics. Nonetheless I don't think I would likely put her on anything prophylactically but rather wait for the results of the culture to return. 02/18/18 on evaluation today patient actually appears to be doing fairly well. She has been tolerating the dressing changes. And the Wound VAC seems to be doing well. Overall I'm very pleased with how things have improved over the past couple weeks since I last saw her. Fortunately there does not appear to be any evidence of overt infection at this time which is good news. She has been taking the antibiotics without complication. Specifically that is the Bactrim DS which was prescribed on 02/08/18. 03/18/18 on evaluation today patient's wound actually does have quite a bit of odor noted compared to previous. With that SHERA, LAUBACH. ([id number]) being said the wound itself overall does not appear to be too significant or worse. There is no event noted in the wound bed which is good news. With that being said the patient has been worried as the home health nurse stated that she thought she saw bone in the base of the wound. Nonetheless there's a little bit of bruising along the left lateral border of the wound  bed which again does not appear to be terrible we have noticed this before but other than this the majority the wound bed appears to be doing excellent in my pinion. I am wondering if there may be some bacterial colonization. This could be leading to some of the odor that we are noting with the Wound VAC. 04/01/18 on evaluation today patient appears to actually be doing well in regard to the overall appearance of the wound itself. She also does not have any odor at this  point in regard to the wound surface and I do believe the Bacons is extremely helpful in this regard. With that being said she does have a little bit of deep tissue injury on the medial aspect of the wound. Again last time it was more lateral. The lateral seems to have improved quite well. Nonetheless she also notes by way of her son that this has been very macerated in the past several weeks since I last saw her. Though things overall seem to be doing better I still think that the Wound VAC may help with managing her fluid better I'm wondering if we can attempt a gauze Wound VAC my hope is that this will be able to manage the moisture control as well as continuing with managing the odor of the wound and bacterial load since the Dakin s has done excellent in helping in that regard. 04/15/17 on evaluation today patient actually appears to be doing much better in regard to the overall appearance of her sacral wound. I do believe that the gauze Vac has been of benefit for her. Overall she is tolerating this without complication which is also good news. There is no sign of injury to the sacral region at this point. 04/29/18 on evaluation today patient appears to be doing well in regard to her sacral ulcer. She sure signs of good granulation and the Gauze VAC is doing very well for her. Overall I'm pleased in this regard. The biggest issue is that the time from Friday till Monday seems to be quite long for her as far as the amount of time to  keep the Wound VAC sealed. For that reason her son wonders if he can take this off on Sunday morning in order to give her day of just wet to dry dressing's with the Dakin's solution I think this would be okay. 05/13/18 on evaluation today patient appears to be doing okay in regard to her sacral ulcer. There does not appear to be any evidence of overt infection at this time which is good news. In general the drainage and moisture appears to be significantly improved this was after the pressure on the Wound VAC was increased after they contacted our office. With that being said a lot of the dark tissue around the edge of the wound has improved in the maceration is significantly better this seems to be managing much better. Fortunately there is no signs of infection at this time. Overall the patient is doing quite well. The wound measurements are not significantly smaller today although they were over the past two visits we will see how things go. 05/27/18 on evaluation today patient's sacral wound actually appears to be doing fairly well at this point. She does have a deep tissue injury yet again on the medial aspect of the wound which is something that we seem to be seeing often. There is some question as to whether or not this is actually being caused by the Wound VAC bunching up and then her somewhat laying on it even though they are attempting to offload this as best as possible. Patient son states that overall they do the best they can trying offload her and I definitely believe him. I may want to see about putting the Wound VAC on hold for a couple weeks and see if this happens just with standard dressing changes. 06/10/18 on evaluation today patient actually appears to be doing better in regard to her sacral wound. Overall there's no signs of infection, no  odor, and no significant skin breakdown around the edge of the wound I feel like this is done much better 50 Dakin s soaked gauze dressing as  opposed to legalizing anything such as the Wound VAC. I believe that we discontinue the Wound VAC as of today. 06/24/18 on evaluation today patient actually appears to be doing well in regard to her sacral ulcer. There's no signs of infection, no odor, and in my pinion no concerns at all. I feel like she is making good progress as far as I'm concerned. 07/12/18 on evaluation today patient appears to be doing very well in regard to her sacral ulcer. She has been tolerating the dressing changes without complication. Fortunately there does not appear to be any evidence of active infection at this time she is doing excellent as far as I'm concerned at this point. 08/11/18 on evaluation today patient's sacral wound actually appears to be doing quite well at this point. The main issue I see is that she continues to have some maceration and skin breakdown due to moisture over the right periwound location unfortunately. We've investigated the possibility of using zinc oxide before but have not utilized it simply due to the fact that she continues to state. Her son anyways that she is allergic to this. They have not tried it recently and may be completely unrelated to the reaction she had before. Nonetheless this may be something you want to attempt will discuss that in the plan. 10/04/18 on evaluation today patient actually appears to be doing quite well in regard to her sacral wound. It's been roughly 2 months since I last seen her in that time her wound has definitely made progress. This is very slow but again nonetheless Jessica [name], Jessica [name]. ([id number]) does seem to be trending in the correct direction. Overall very pleased in this regard. No fevers, chills, nausea, or vomiting noted at this time. Patient History Information obtained from Patient. Family History No family history of Cancer, Diabetes, Heart Disease. Social History Never smoker, Marital Status - Married. Review of Systems  (ROS) Constitutional Symptoms (General Health) Denies complaints or symptoms of Fatigue, Fever, Chills, Marked Weight Change. Respiratory Denies complaints or symptoms of Chronic or frequent coughs, Shortness of Breath. Cardiovascular Denies complaints or symptoms of Chest pain, LE edema. Psychiatric Denies complaints or symptoms of Anxiety, Claustrophobia. Objective Constitutional Well-nourished and well-hydrated in no acute distress. Vitals Time Taken: 11:00 AM, Height: 70 in, Temperature: 97.7 F, Pulse: 69 bpm, Respiratory Rate: 16 breaths/min, Blood Pressure: 149/66 mmHg. Respiratory normal breathing without difficulty. clear to auscultation bilaterally. Cardiovascular regular rate and rhythm with normal S1, S2. Psychiatric this patient is able to make decisions and demonstrates good insight into disease process. Alert and Oriented x 3. pleasant and cooperative. General Notes: Patient's wound bed currently showed signs of good granulation at this time. Fortunately there does not appear to be any evidence of active infection which is good news. Overall very pleased with the progress that has been made over the two months since I last saw her. No sharp debridement was necessary. Integumentary (Hair, Skin) Wound #1 status is Open. Original cause of wound was Pressure Injury. The wound is located on the Medial Sacrum. The wound measures 6.5cm length x 3.7cm width x 3cm depth; 18.889cm^2 area and 56.666cm^3 volume. There is muscle, Fat Layer (Subcutaneous Tissue) Exposed, and fascia exposed. There is no tunneling or undermining noted. There is a medium Enerson, Samyrah W. ([id number]) amount of sanguinous drainage noted. Foul odor after  cleansing was noted. The wound margin is distinct with the outline attached to the wound base. There is large (67-100%) red, pink, hyper - granulation within the wound bed. There is a small (1-33%) amount of necrotic tissue within the wound bed  including Adherent Slough. 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 Skin Barriers/Peri-Wound Care: Wound #1 Medial Sacrum: Skin Prep Primary Wound Dressing: Wound #1 Medial Sacrum: Pack wound with: - Dakin's soaked gauze Secondary Dressing: Wound #1 Medial Sacrum: ABD pad - secure with tape Dressing Change Frequency: Wound #1 Medial Sacrum: Change dressing twice daily. Follow-up Appointments: Wound #1 Medial Sacrum: Return Appointment in 3 weeks. Off-Loading: Wound #1 Medial Sacrum: Turn and reposition every 2 hours Additional Orders / Instructions: Wound #1 Medial Sacrum: Increase protein intake. Home Health: Wound #1 Medial Sacrum: Continue Home Health Visits - [facility] Nurse may visit PRN to address patient s wound care needs. FACE TO FACE ENCOUNTER: MEDICARE and MEDICAID PATIENTS: I certify that this patient is under my care and that I LINDSY, CERULLO. ([id number]) had a face-to-face encounter that meets the physician face-to-face encounter requirements with this patient on this date. The encounter with the patient was in whole or in part for the following MEDICAL CONDITION: (primary reason for Home Healthcare) MEDICAL NECESSITY: I certify, that based on my findings, NURSING services are a medically necessary home health service. HOME BOUND STATUS: I certify that my clinical findings support that this patient is homebound (i.e., Due to illness or injury, pt requires aid of supportive  devices such as crutches, cane, wheelchairs, walkers, the use of special transportation or the assistance of another person to leave their place of residence. There is a normal inability to leave the home and doing so requires considerable and taxing effort. Other absences are for medical reasons / religious services and are infrequent or of short duration when for other reasons). If current dressing causes regression in wound condition, may D/C ordered dressing product/s and apply Normal Saline Moist Dressing daily until next Wound Healing Center / Other MD appointment. Notify Wound Healing Center of regression in wound condition at [phone]. Please direct any NON-WOUND related issues/requests for orders to patient's Primary Care Physician My suggestion at this time is gonna be that we continue with the above wound care measures for the next week and the patient is in agreement with that plan. We will subsequently see were things that at follow-up. If anything changes or worsens the meantime they will contact the office let me know. Otherwise my hope is she will continue to show signs of good improvement. Please see above for specific wound care orders. We will see patient for re-evaluation in 2 week(s) here in the clinic. If anything worsens or changes patient will contact our office for additional recommendations. Electronic Signature(s) Signed: 10/05/2018 10:45:49 PM By: Lenda Kelp PA-C Entered By: Lenda Kelp on 10/05/2018 22:33:45 Jessica Brady ([id number]) -------------------------------------------------------------------------------- ROS/PFSH Details Patient Name: Jessica Brady. Date of Service: 10/04/2018 10:45 AM Medical Record Number: [id number] Patient Account Number: [id number] Date of Birth/Sex: 1945/12/02 (73 y.o.  F) Treating RN: Curtis Sites Primary Care Provider: Annita Brod Other Clinician: Referring Provider: Annita Brod Treating  Provider/Extender: STONE III, Kenly Xiao Weeks in Treatment: 70 Information Obtained From Patient Constitutional Symptoms (General Health) Complaints and Symptoms: Negative for: Fatigue; Fever; Chills; Marked Weight Change Respiratory Complaints and Symptoms: Negative for: Chronic or frequent coughs; Shortness of Breath Cardiovascular Complaints and Symptoms: Negative for: Chest pain; LE edema Psychiatric Complaints and Symptoms: Negative for: Anxiety; Claustrophobia Immunizations Pneumococcal Vaccine: Received Pneumococcal Vaccination: No Implantable Devices No devices added Family and Social History Cancer: No; Diabetes: No; Heart Disease: No; Never smoker; Marital Status - Married Physician Affirmation I have reviewed and agree with the above information. Electronic Signature(s) Signed: 10/05/2018 10:45:49 PM By: Lenda Kelp PA-C Signed: 10/06/2018 5:10:09 PM By: Curtis Sites Entered By: Lenda Kelp on 10/05/2018 22:33:20 Jessica Brady ([id number]) -------------------------------------------------------------------------------- SuperBill Details Patient Name: Jessica Brady. Date of Service: 10/04/2018 Medical Record Number: [id number] Patient Account Number: [id number] Date of Birth/Sex: 07/03/1945 (73 y.o. F) Treating RN: Curtis Sites Primary Care Provider: Annita Brod Other Clinician: Referring Provider: Annita Brod Treating Provider/Extender: STONE III, Talyssa Gibas Weeks in Treatment: 70 Diagnosis Coding ICD-10 Codes Code Description L89.154 Pressure ulcer of sacral region, stage 4 M48.00 Spinal stenosis, site unspecified R54 Age-related physical debility G89.4 Chronic pain syndrome E78.2 Mixed hyperlipidemia L89.93 Pressure ulcer of unspecified site, stage 3 Facility Procedures CPT4 Code: [id number] Description: 99213 - WOUND CARE VISIT-LEV 3 EST PT Modifier: Quantity: 1 Physician Procedures CPT4 Code: [id number] Description: 99214 - WC PHYS LEVEL  4 - EST PT ICD-10 Diagnosis Description L89.154 Pressure ulcer of sacral region, stage 4 M48.00 Spinal stenosis, site unspecified R54 Age-related physical debility G89.4 Chronic pain syndrome Modifier: Quantity: 1 Electronic Signature(s) Signed: 10/05/2018 10:45:49 PM By: Lenda Kelp PA-C Entered By: Lenda Kelp on 10/05/2018 22:33:57

## 2018-10-07 NOTE — Progress Notes (Signed)
Jessica [name], [name] ([id number]) Visit Report for 10/04/2018 Arrival Information Details [name], [name] Date of Service: 10/04/2018 10:45 AM Medical Record Number: [id number] Patient Account Number: [id number] Date of Birth/Sex: [birth year] [age] y.o. F) Treating RN: Jessica [name] Primary Care Jessica [name]: Jessica [name] Other Clinician: Referring Jessica [name]: Jessica [name] Treating Travanti Mcmanus/Extender: Jessica [name]: 72 Visit Information History Since Last Visit Added or deleted any medications: No Patient Arrived: Stretcher Any new allergies or adverse reactions: No Arrival Time: 11:01 Had a fall or experienced change in No Accompanied By: son activities of daily living that may affect Transfer Assistance: Transfer Board risk of falls: Patient Has Alerts: Yes Signs or symptoms of abuse/neglect since last visito No Patient Alerts: ALLERGIC TO ZINC Hospitalized since last visit: No Has Dressing in Place as Prescribed: Yes Pain Present Now: No Electronic Signature(s) Signed: 10/04/2018 3:33:36 PM By: Jessica [name] Entered By: Jessica [name] on 10/04/2018 11:01:33 Jessica [name], [name] ([id number]) -------------------------------------------------------------------------------- Clinic Level of Care Assessment Details [name], [name] Date of Service: 10/04/2018 10:45 AM Medical Record Number: [id number] Patient Account Number: [id number] Date of Birth/Sex: [birth year] [age] y.o. F) Treating RN: Jessica [name] Primary Care Jonas Goh: Jessica [name] Other Clinician: Referring Jessica [name]: Jessica [name] Treating Jessica [name]: STONE III, HOYT Weeks in [name]: 70 Clinic Level of Care Assessment Items TOOL 4 Quantity Score []  - Use when only an EandM is performed on FOLLOW-UP visit 0 ASSESSMENTS - Nursing Assessment / Reassessment X - Reassessment of Co-morbidities (includes updates in patient status) 1 10 X- 1 5 Reassessment of Adherence to  [name] Plan ASSESSMENTS - Wound and Skin Assessment / Reassessment X - Simple Wound Assessment / Reassessment - one wound 1 5 []  - 0 Complex Wound Assessment / Reassessment - multiple wounds X- 1 10 Dermatologic / Skin Assessment (not related to wound area) ASSESSMENTS - Focused Assessment []  - Circumferential Edema Measurements - multi extremities 0 []  - 0 Nutritional Assessment / Counseling / Intervention []  - 0 Lower Extremity Assessment (monofilament, tuning fork, pulses) []  - 0 Peripheral Arterial Disease Assessment (using hand held doppler) ASSESSMENTS - Ostomy and/or Continence Assessment and Care []  - Incontinence Assessment and Management 0 []  - 0 Ostomy Care Assessment and Management (repouching, etc.) PROCESS - Coordination of Care X - Simple Patient / Family Education for ongoing care 1 15 []  - 0 Complex (extensive) Patient / Family Education for ongoing care X- 1 10 Staff obtains ChiropractorConsents, Records, Test Results / Process Orders []  - 0 Staff telephones HHA, Nursing Homes / Clarify orders / etc []  - 0 Routine Transfer to another Facility (non-emergent condition) []  - 0 Routine Hospital Admission (non-emergent condition) []  - 0 New Admissions / Manufacturing engineernsurance Authorizations / Ordering NPWT, Apligraf, etc. []  - 0 Emergency Hospital Admission (emergent condition) X- 1 10 Simple Discharge Coordination Jessica [name], [name] ([id number]) []  - 0 Complex (extensive) Discharge Coordination PROCESS - Special Needs []  - Pediatric / Minor Patient Management 0 []  - 0 Isolation Patient Management []  - 0 Hearing / Language / Visual special needs []  - 0 Assessment of Community assistance (transportation, D/C planning, etc.) []  - 0 Additional assistance / Altered mentation []  - 0 Support Surface(s) Assessment (bed, cushion, seat, etc.) INTERVENTIONS - Wound Cleansing / Measurement X - Simple Wound Cleansing - one wound 1 5 []  - 0 Complex Wound Cleansing - multiple  wounds X- 1 5 Wound Imaging (photographs - any number of wounds) []  - 0 Wound Tracing (instead of photographs) X- 1 5 Simple  Wound Measurement - one wound []  - 0 Complex Wound Measurement - multiple wounds INTERVENTIONS - Wound Dressings X - Small Wound Dressing one or multiple wounds 1 10 []  - 0 Medium Wound Dressing one or multiple wounds []  - 0 Large Wound Dressing one or multiple wounds []  - 0 Application of Medications - topical []  - 0 Application of Medications - injection INTERVENTIONS - Miscellaneous []  - External ear exam 0 []  - 0 Specimen Collection (cultures, biopsies, blood, body fluids, etc.) []  - 0 Specimen(s) / Culture(s) sent or taken to Lab for analysis []  - 0 Patient Transfer (multiple staff / Nurse, adultHoyer Lift / Similar devices) []  - 0 Simple Staple / Suture removal (25 or less) []  - 0 Complex Staple / Suture removal (26 or more) []  - 0 Hypo / Hyperglycemic Management (close monitor of Blood Glucose) []  - 0 Ankle / Brachial Index (ABI) - do not check if billed separately X- 1 5 Vital Signs [name], [name] ([id number]) Has the patient been seen at the hospital within the last three years: Yes Total Score: 95 Level Of Care: New/Established - Level 3 Electronic Signature(s) Signed: 10/04/2018 4:25:12 PM By: Jessica [name] Entered By: Jessica [name] on 10/04/2018 11:43:15 Jessica [name], [name] ([id number]) -------------------------------------------------------------------------------- Encounter Discharge Information Details [name], [name] Date of Service: 10/04/2018 10:45 AM Medical Record Number: [id number] Patient Account Number: [id number] Date of Birth/Sex: 1945/10/23 7(73 y.o. F) Treating RN: Jessica [name] Primary Care Shery Wauneka: Jessica [name] Other Clinician: Referring Kalkidan Caudell: Jessica [name] Treating Cyril Woodmansee/Extender: Jessica [name]: 70 Encounter Discharge Information Items Discharge Condition:  Stable Ambulatory Status: Stretcher Discharge Destination: Home Transportation: Ambulance Accompanied By: son and EMS Schedule Follow-up Appointment: Yes Clinical Summary of Care: Electronic Signature(s) Signed: 10/04/2018 4:25:12 PM By: Jessica [name] Entered By: Jessica [name] on 10/04/2018 11:45:23 Jessica [name], [name] ([id number]) -------------------------------------------------------------------------------- Lower Extremity Assessment Details [name], [name] Date of Service: 10/04/2018 10:45 AM Medical Record Number: [id number] Patient Account Number: [id number] Date of Birth/Sex: 1945/10/23 [age] y.o. F) Treating RN: Jessica [name] Primary Care Lilyauna Miedema: Jessica [name] Other Clinician: Referring Dailin Sosnowski: Jessica [name] Treating Zakkary Thibault/Extender: Jessica [name]: 70 Electronic Signature(s) Signed: 10/04/2018 3:33:36 PM By: Jessica [name] Entered By: Jessica [name] on 10/04/2018 11:06:25 Jessica [name], [name] ([id number]) -------------------------------------------------------------------------------- Multi Wound Chart Details [name], Alithea W. Date of Service: 10/04/2018 10:45 AM Medical Record Number: [id number] Patient Account Number: [id number] Date of Birth/Sex: 1945/10/23 22(73 y.o. F) Treating RN: Jessica [name] Primary Care Terissa Haffey: Jessica [name] Other Clinician: Referring Orpheus Hayhurst: Jessica [name] Treating Graves Nipp/Extender: STONE III, HOYT Weeks in [name]: 70 Vital Signs Height(in): 70 Pulse(bpm): 69 Weight(lbs): Blood Pressure(mmHg): 149/66 Body Mass Index(BMI): Temperature(F): 97.7 Respiratory Rate 16 (breaths/min): Photos: [1:No Photos] [N/A:N/A] Wound Location: [1:Sacrum - Medial] [N/A:N/A] Wounding Event: [1:Pressure Injury] [N/A:N/A] Primary Etiology: [1:Pressure Ulcer] [N/A:N/A] Date Acquired: [1:01/15/2017] [N/A:N/A] Weeks of [name]: [1:70] [N/A:N/A] Wound Status: [1:Open] [N/A:N/A] Measurements L x W  x D [1:6.5x3.7x3] [N/A:N/A] (cm) Area (cm) : [1:18.889] [N/A:N/A] Volume (cm) : [1:56.666] [N/A:N/A] % Reduction in Area: [1:48.70%] [N/A:N/A] % Reduction in Volume: [1:-53.80%] [N/A:N/A] Classification: [1:Category/Stage IV] [N/A:N/A] Exudate Amount: [1:Medium] [N/A:N/A] Exudate Type: [1:Sanguinous] [N/A:N/A] Exudate Color: [1:red] [N/A:N/A] Foul Odor After Cleansing: [1:Yes] [N/A:N/A] Odor Anticipated Due to [1:No] [N/A:N/A] Product Use: Wound Margin: [1:Distinct, outline attached] [N/A:N/A] Granulation Amount: [1:Large (67-100%)] [N/A:N/A] Granulation Quality: [1:Red, Pink, Hyper-granulation] [N/A:N/A] Necrotic Amount: [1:Small (1-33%)] [N/A:N/A] Exposed Structures: [1:Fascia: Yes Fat Layer (Subcutaneous Tissue) Exposed: Yes Muscle: Yes Tendon: No Joint: No Bone:  No None] [N/A:N/A N/A] [name] Notes Electronic Signature(s) Jessica [name], [name] ([id number]) Signed: 10/04/2018 4:25:12 PM By: Jessica [name] Entered By: Jessica [name] on 10/04/2018 11:34:31 Jessica [name], Betsi W. ([id number]) -------------------------------------------------------------------------------- Multi-Disciplinary Care Plan Details [name], Armando W. Date of Service: 10/04/2018 10:45 AM Medical Record Number: [id number] Patient Account Number: [id number] Date of Birth/Sex: January 14, 1946 [age] y.o. F) Treating RN: Jessica [name] Primary Care Keyshla Tunison: Jessica [name] Other Clinician: Referring Debera Sterba: Jessica [name] Treating Fuller Makin/Extender: STONE III, HOYT Weeks in [name]: 70 Active Inactive Orientation to the Wound Care Program Nursing Diagnoses: Knowledge deficit related to the wound healing center program Goals: Patient/caregiver will verbalize understanding of the Wound Healing Center Program Date Initiated: 06/03/2017 Target Resolution Date: 06/17/2017 Goal Status: Active Interventions: Provide education on orientation to the wound center Notes: Pressure Nursing  Diagnoses: Knowledge deficit related to causes and risk factors for pressure ulcer development Knowledge deficit related to management of pressures ulcers Potential for impaired tissue integrity related to pressure, friction, moisture, and shear Goals: Patient will remain free from development of additional pressure ulcers Date Initiated: 06/03/2017 Target Resolution Date: 06/17/2017 Goal Status: Active Patient will remain free of pressure ulcers Date Initiated: 06/03/2017 Target Resolution Date: 06/17/2017 Goal Status: Active Patient/caregiver will verbalize risk factors for pressure ulcer development Date Initiated: 06/03/2017 Target Resolution Date: 06/17/2017 Goal Status: Active Interventions: Assess: immobility, friction, shearing, incontinence upon admission and as needed Assess potential for pressure ulcer upon admission and as needed Notes: Wound/Skin Impairment Jessica [name], Courtny W. ([id number]) Nursing Diagnoses: Impaired tissue integrity Goals: Patient/caregiver will verbalize understanding of skin care regimen Date Initiated: 06/03/2017 Target Resolution Date: 06/17/2017 Goal Status: Active Ulcer/skin breakdown will have a volume reduction of 30% by week 4 Date Initiated: 06/03/2017 Target Resolution Date: 06/17/2017 Goal Status: Active Interventions: Assess ulceration(s) every visit [name] Activities: Patient referred to home care : 06/03/2017 Skin care regimen initiated : 06/03/2017 Notes: Electronic Signature(s) Signed: 10/04/2018 4:25:12 PM By: Jessica [name] Entered By: Jessica [name] on 10/04/2018 11:34:13 Jessica [name], Shuntell W. ([id number]) -------------------------------------------------------------------------------- Pain Assessment Details [name], Cythina W. Date of Service: 10/04/2018 10:45 AM Medical Record Number: [id number] Patient Account Number: [id number] Date of Birth/Sex: January 14, 1946 [age] y.o. F) Treating RN: Jessica [name] Primary Care Cassia Fein:  Jessica [name] Other Clinician: Referring Tiane Szydlowski: Jessica [name] Treating Burdette Gergely/Extender: STONE III, HOYT Weeks in [name]: 70 Active Problems Location of Pain Severity and Description of Pain Patient Has Paino No Site Locations Pain Management and Medication Current Pain Management: Electronic Signature(s) Signed: 10/04/2018 3:33:36 PM By: Jessica [name] Entered By: Jessica [name] on 10/04/2018 11:01:40 Jessica [name], Palak W. ([id number]) -------------------------------------------------------------------------------- Patient/Caregiver Education Details [name], Gulianna W. Date of Service: 10/04/2018 10:45 AM Medical Record Number: [id number] Patient Account Number: [id number] Date of Birth/Gender: January 14, 1946 (73 y.o. F) Treating RN: Jessica [name] Primary Care Physician: Jessica [name] Other Clinician: Referring Physician: Annita BrodASENSO, [name] Treating Physician/Extender: Jessica [name]: [id number] Education Assessment Education Provided To: Patient and Caregiver Education Topics Provided Wound/Skin Impairment: Handouts: Other: wound care as ordered Methods: Demonstration, Explain/Verbal Responses: State content correctly Electronic Signature(s) Signed: 10/04/2018 4:25:12 PM By: Jessica [name] Entered By: Jessica [name] on 10/04/2018 11:44:16 Jessica [name], Ajna W. ([id number]) -------------------------------------------------------------------------------- Wound Assessment Details [name], Brookie W. Date of Service: 10/04/2018 10:45 AM Medical Record Number: [id number] Patient Account Number: [id number] Date of Birth/Sex: January 14, 1946 [age] y.o. F) Treating RN: Jessica [name] Primary Care Annalaura Sauseda: Jessica [name] Other Clinician: Referring Thurmon Mizell: Jessica [name] Treating Shailee Foots/Extender: STONE III, HOYT Weeks in [name]: 70 Wound Status Wound Number: 1  Primary Etiology: Pressure Ulcer Wound Location: Sacrum - Medial Wound Status:  Open Wounding Event: Pressure Injury Date Acquired: 01/15/2017 Weeks Of [name]: 70 Clustered Wound: No Photos Photo Uploaded By: Army Melia on 10/04/2018 14:56:30 Wound Measurements Length: (cm) 6.5 Width: (cm) 3.7 Depth: (cm) 3 Area: (cm) 18.889 Volume: (cm) 56.666 % Reduction in Area: 48.7% % Reduction in Volume: -53.8% Epithelialization: None Tunneling: No Undermining: No Wound Description Classification: Category/Stage IV Wound Margin: Distinct, outline attached Exudate Amount: Medium Exudate Type: Sanguinous Exudate Color: red Foul Odor After Cleansing: Yes Due to Product Use: No Slough/Fibrino Yes Wound Bed Granulation Amount: Large (67-100%) Exposed Structure Granulation Quality: Red, Pink, Hyper-granulation Fascia Exposed: Yes Necrotic Amount: Small (1-33%) Fat Layer (Subcutaneous Tissue) Exposed: Yes Necrotic Quality: Adherent Slough Tendon Exposed: No Muscle Exposed: Yes Necrosis of Muscle: No Joint Exposed: No Bone Exposed: No Borunda, Elizaveta W. ([id number]) [name] Notes Wound #1 (Medial Sacrum) Notes Dakins soaked gauze packed into wound, BFD Electronic Signature(s) Signed: 10/04/2018 3:33:36 PM By: Army Melia Entered By: Army Melia on 10/04/2018 11:06:08 [name] ([id number]) -------------------------------------------------------------------------------- Vitals Details Patient Name: [name]. Date of Service: 10/04/2018 10:45 AM Medical Record Number: [id number] Patient Account Number: [id number] Date of Birth/Sex: 1945/11/19 (73 y.o. F) Treating RN: Army Melia Primary Care Equilla Que: Clovia Cuff Other Clinician: Referring Marymargaret Kirker: Clovia Cuff Treating Lochlan Grygiel/Extender: STONE III, HOYT Weeks in [name]: 70 Vital Signs Time Taken: 11:00 Temperature (F): 97.7 Height (in): 70 Pulse (bpm): 69 Respiratory Rate (breaths/min): 16 Blood Pressure (mmHg): 149/66 Reference Range: 80 - 120 mg /  dl Electronic Signature(s) Signed: 10/04/2018 3:33:36 PM By: Army Melia Entered By: Army Melia on 10/04/2018 11:01:56

## 2018-10-25 ENCOUNTER — Ambulatory Visit: Payer: Medicare Other | Admitting: Physician Assistant

## 2018-11-07 ENCOUNTER — Encounter: Payer: Medicare Other | Attending: Physician Assistant | Admitting: Physician Assistant

## 2018-11-07 NOTE — Progress Notes (Signed)
Jessica [name], Jessica [name] ([id number]) Visit Report for 11/07/2018 Allergy List Details [name], Jessica [name]. Date of Service: 11/07/2018 4:00 PM Medical Record Number: [id number] Patient Account Number: [id number] Date of Birth/Sex: Oct 07, 1945 (73 y.o. F) Treating RN: Harold Barban Primary Care Royetta Probus: Clovia Cuff Other Clinician: Referring Zaylan Kissoon: Clovia Cuff Treating Ivanna Kocak/Extender: STONE III, HOYT Weeks in Treatment: 75 Allergies Active Allergies zinc Ambien Allergy Notes Electronic Signature(s) Signed: 11/07/2018 4:20:50 PM By: Worthy Keeler PA-C Entered By: Worthy Keeler on 11/07/2018 16:20:50

## 2018-11-08 ENCOUNTER — Ambulatory Visit: Payer: Medicare Other | Admitting: Physician Assistant

## 2018-11-09 NOTE — Progress Notes (Signed)
Jessica [name], [name] ([id number]) Visit Report for 11/07/2018 Chief Complaint Document Details [name], [name] Date of Service: 11/07/2018 4:00 PM Medical Record Number: [id number] Patient Account Number: [id number] Date of Birth/Sex: April 06, 1946 [age] y.o. F) Treating RN: Arnette NorrisBiell, Kristina Primary Care Provider: Annita BrodASENSO, PHILIP Other Clinician: Referring Provider: Annita BrodASENSO, PHILIP Treating Provider/Extender: Linwood DibblesSTONE III, Deniece Rankin Weeks in Treatment: 75 Information Obtained from: Patient Chief Complaint she is here for evaluation of a sacral ulcer Electronic Signature(s) Signed: 11/07/2018 4:21:30 PM By: Lenda KelpStone III, Carolena Fairbank PA-C Entered By: Lenda KelpStone III, Artha Chiasson on 11/07/2018 16:21:30 Jessica [name], Fumiye W. ([id number]) -------------------------------------------------------------------------------- HPI Details [name], [name] Date of Service: 11/07/2018 4:00 PM Medical Record Number: [id number] Patient Account Number: [id number] Date of Birth/Sex: April 06, 1946 [age] y.o. F) Treating RN: Arnette NorrisBiell, Kristina Primary Care Provider: Annita BrodASENSO, PHILIP Other Clinician: Referring Provider: Annita BrodASENSO, PHILIP Treating Provider/Extender: Linwood DibblesSTONE III, Darlean Warmoth Weeks in Treatment: 75 History of Present Illness HPI Description: 05/27/17-she is here in initial evaluation for a left-sided sacral stage IV pressure ulcer and bilateral lower extremity, lateral aspect, unstageable pressure ulcers. She is accompanied by her husband and her son, who are her primary caregivers. She is bedbound secondary to spinal stenosis. According to her son and husband she was hospitalized from 10/5-11/1 with healthcare associated pneumonia and altered mental status. During her hospitalization she was intubated, extubated on 10/27. She was discharged with Foley catheter and follow up with urology. According to her son and spouse she developed the sacral ulcer during hospitalization. Home health has been applying Santyl daily. She does  have a low air loss mattress and is repositioned every 2-3 hours per her family's report. According to the son and spouse she had an appointment with urology on 12/18 and during that appointment developed discoloration to her bilateral lower extremities which ultimately developed into unstageable pressure ulcers to the lateral aspects of her bilateral lower extremities. There has been no topical treatment applied to these. She continues to have home health. There is no concerns expressed regarding dietary intake, stating she eats 3 meals a day, eating was provided; she is supplemented with boost with protein. 06/03/17-she is here in follow-up evaluation for sacral and bilateral lower extremity ulcers. Plain film x-ray done today reveals no distraction to the sacrum or coccyx, no visible abnormalities. Home health has ordered the negative pressure wound system but it has not been initiated. We will continue with Hydrofera Blue until initiation of negative pressure wound system and continue with Santyl to bilateral lower extremity ulcers. Follow-up next week 06/10/17-she is here in follow-up evaluation for sacral and bilateral lower extremity ulcers. The wound VAC will be available tomorrow per home health. We will initiate wound VAC therapy to the sacral ulcer 3 times weekly (Thursday, Saturday/Sunday, Tuesday). We will continue with Santyl to the lower extremity ulcers. The patient's [facility] with the understanding that if VAC changes cannot be performed over the weekend he will need to change his mother's appointments to Monday, Wednesday or Friday. The sacral ulcer clinically does not appear infected but there has been a change in the amount of drainage acutely, there is no significant amount of devitalized tissue, there is no malodor. Wound culture was obtained to evaluate for occult infection we will hold off on antibiotic therapy  until sensitivities are resulted. 06/18/17 on evaluation today patient appears to be doing fairly well in my opinion although this is the first time I have seen this patient she  has been previously evaluated by Denny Peon here in our office. She is going to be switching to Fridays to see me due to the Wound VAC schedule being changed on Monday, Wednesday, and Friday. Subsequently she seems to be doing fairly well with the Wound VAC. Her son who was present during evaluation today states that he somewhat stresses of the Wound VAC in making sure that it was functioning appropriately. With that being said everything seems to be working well he knows what settings on the [facility] itself to look at and ensure that it is functioning properly. Overall the wound appears to be nice and clean there is no need for debridement today. She has no discomfort in her bilateral lower extremity ulcers also appear to be improving based on measurements and what this honest tell me about the overall appearance. 07/02/17 on evaluation today I noted in patients wound bed that was actually an odor that had not previously been noted. Subsequently there was also a small area of bone that was not previously noted during my last evaluation. This did appear to be necrotic and was being somewhat forced out by the body around the region of granulation. She does not have any pain which is good news. Nonetheless the overall appearance of the ulcer is making me concerned for the patient having developed osteomyelitis. Currently she is not been on any antibiotics and the Wound VAC has been doing fairly well in general. With that being said I do not think we need to continue the Wound VAC if she potentially has a bone infection. At least not until it is properly addressed with antibiotics. 07/09/17 on evaluation today patient appears to actually be doing very well in regard to her sacral ulcer compared to last week. There is actually no exposed bone  at this point. 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 With that being said still I do believe she needs to have a referral to infectious disease due to the osteomyelitis I am gonna recommend that she continue with the doxycycline based on the culture results which showed Eikenella Corrodens as the DEZIRAE, SERVICE. ([id number]) organism identified that tetracycline should work for. She does not seem to be having any discomfort whatsoever at this point. 07/16/17 on evaluation today patient actually appears to be doing rather well in regard to her sacral wound. I think that the original wound site actually appears much better than previously noted. With that being said she does have a new superficial injury on the right sacral region which appears to be due to according to the sign transport that occurred for her MRI unfortunately. Fortunately this is not too deep and I do think it can be managed but it was a new area that was not previously noted. Otherwise she has been tolerating the Dakinos soaked gauze packing without complication. 07/30/17 on evaluation today patient presents for reevaluation concerning her sacral wound. She has been tolerating the dressing changes without complication. With that being said her wound is doing so well at this point that I think she may be at the point where we could reinitiate the Wound VAC currently and hopefully see good results from this. I'm very pleased with how she has responded to the Dakinos soaked gauze packing. 08/13/17 evaluation today patient appears to be doing excellent in regard to her lower extremity  ulcer this seems to be cleaning up very nicely. In regard to the sacral ulcer the area of trauma on the right lateral portion of the wound actually appears to be much better than previously noted  during the last evaluation. The word has definitely filled in and the Wound VAC appears to be helpful I do believe. Overall I'm very pleased with how things seem to be progressing at this time. Patient likewise is also happy that things are doing well. 08/27/17 on evaluation today patient presents for follow-up concerning her ongoing issues with her sacral ulcer and lower extremity ulcer. Fortunately she has been tolerating the dressing changes well complication the Wound VAC in general seems to have done very well up to this point. She does not have any evidence of infection which is good news. She does have some dark discoloration in central portion of the wound which is troublesome for the possibility of pressure injury to the site it sounds like her prior air mattress was not functioning properly her son has just bought a new one for her this is doing much better. Otherwise things seem to be progressing nicely. 09/10/17 on evaluation today patient presents for follow-up concerning her sacral wound and lower extremity ulcer. She has been tolerating the switch of her dressing changes to the Dakinos soaked gauze dressing very well in regard to the sacrum. The left lateral lower extremity ulcer appears to have a new area open just distal to the one that we have been treating with Santyl and this is new since her last visit. There does not appear to be any evidence of infection which is good news. With that being said there's a lot of maceration here at the site and I feel this is likely due to the switch and the dressings it appears that due to the sticking of the dressings it will switch to utilizing a telfa pad over the Santyl. I think this caused a lot of drainage to be collected and situated in the region just under the dressing which has led to this causing which duration of breakdown. Overall there does not appear to be any significant pain which is good news. Of note I did actually have a  conversation with the radiologist who is an interventional radiologist with [facility]. 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 With that being said there does not appear to be any rush to get this done according to the interventional radiologist whom I spoke with. Therefore after discussion it was determined that there gonna wait about six months prior to considering the procedure to get time for hopefully the sacral wound to heal in the interim to at least some degree. 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 According to nursing staff these are all improved, the negative pressure wound therapy system was put on hold last week. We will resume negative pressure wound therapy today, continue with Santyl to the lower extremity and she will follow-up next week. 09/24/17 on evaluation today unfortunately the patient's wound appears to be doing significantly worse compared to last week's evaluation and even my valuation the week before. She actually has bone noted on the left wound margin in the region of undermining unfortunately. This was not noted during the last evaluation. I am concerned about the fact that this seems to be worse not better since our last visit with her.  My biggest concern is that she's likely developing a worsening osteomyelitis. This was discussed with patient and her son today during the office visit. 05/03/17 on evaluation today patient actually appears to be doing rather well in regard to her sacral ulcer compared to last week's evaluation. She has been tolerating the dressing changes without complication. Fortunately even though we're not utilizing the Wound VAC she seems to be making some strides in seeing the wound area overall improved quite dramatically in my pinion in just one weeks time. She also seems to be staying off of this to the point  that I do not see any evidence of new injury which is excellent news. Whatever she's been doing in that regard over the past week I would want her to continue. I did also review the results of her bone culture which revealed group B strep as the responsible organism. Again this is what's causing her osteomyelitis she does have infectious disease appointment scheduled for 10/11/17 10/08/1898 valuation today patient appears to be doing better for the most part in regard to the sacral wound. Overall she is showing signs of having granulation of the bone which is good news. There is an area towards the left of the wound where she does seem to be showing some signs of opening this it would be due to additional pressure to the area unfortunately. RAMANDA, PAULES. ([id number]) There does not appear to be any evidence of infection spreading which is good news that do not see any evidence of significant purulent discharge which is also good news. 10/26/17 on evaluation today patient sacral ulcer actually appears to be very healthy and doing well in regard to the overall appearance of the wound. With that being said she does seem to be having some issues at this point in time with some shear/friction injury of the sacrum from where she was transported to [facility] for her infectious disease appointment. She has been placed on IV antibiotic therapy I will have to go back and find that note for review as I did not have access to that today. Nonetheless I will add the next visit be sure to document the exact antibiotics that she is taking at this point. Nonetheless I do believe the wound is doing better there's no bone exposure nothing that requires debridement in regard to the sacral wound. Likewise her left lateral lower extremity ulcer also appears to be doing significantly better. In general I feel like things are showing signs of improvement all around. 11/12/17 on evaluation today patient appears to be doing  rather well in regard to her sacral wound. In general I do see signs of granulation at this point. Fortunately there does not appear to be any evidence of infection which is good news as well. In general overall happy with the appearance. With that being said I'll be see I would like for this to be progressing more rapidly as with the patient and her son but nonetheless at least things do not appear to be doing worse. Her lower extremity ulcer is doing significantly better. 11/26/17 on evaluation today patient appears to be doing a little better in regard to the sacral wound especially in the peripheral of the wound bed. She actually did have an abrasion on the right gluteal region that's completely resolved to this point. Her lower extremity also appears to be for the most part completely resolved. Overall the sacrum itself seems to be the one thing that is still open and given her  trouble. No fevers, chills, nausea, or vomiting noted at this time. She actually has an appointment next week with infectious disease for recheck to see how things are doing. She maybe switch to oral medications at that point. 12/09/17 on evaluation today patient actually appears to be doing much better in regard to her sacral wound. I do feel like she has made good progress at this time as far as healing is concerned. In fact the wound looks better to me today than it has in quite some time. She is on oral antibiotic therapy and no longer on the IV antibiotics. She did see infectious disease last week. 12/24/17 on evaluation today patient's wounds actually appear to be doing decently well in regard to the sacral wound in particular. When it comes to her lower extremity ulcers both appear to be completely healed at this point in the one area where she had lived the rash has completely resolved at this time. Overall very pleased with the progress that has been made. Nonetheless the patient seems to be doing well in general.  She still does have quite a bit of healing to do in regard to the sacral wound but I feel like she is making progress. 01/07/18 on evaluation today patient actually appears to be doing excellent today regard to her sacral wound. The granular quality in the base of the wound is excellent and shown signs of good improvement there's no evidence of contusion nor deep tissue injury and the periwound actually appears to be doing well. In general I'm very pleased with the overall appearance. 02/04/18 on evaluation today patient's wound actually appears to be doing decently well as far as the granulation is concerned. She has been tolerating the dressing changes without complication. Right now we are still using the Wound VAC. Nonetheless overall there apparently is some cater to the wound and this is concerning simply for the fact that she could be developing a soft tissue infection again. She's had this happen in the past and at that time responded very well to the antibiotics. Nonetheless I don't think I would likely put her on anything prophylactically but rather wait for the results of the culture to return. 02/18/18 on evaluation today patient actually appears to be doing fairly well. She has been tolerating the dressing changes. And the Wound VAC seems to be doing well. Overall I'm very pleased with how things have improved over the past couple weeks since I last saw her. Fortunately there does not appear to be any evidence of overt infection at this time which is good news. She has been taking the antibiotics without complication. Specifically that is the Bactrim DS which was prescribed on 02/08/18. 03/18/18 on evaluation today patient's wound actually does have quite a bit of odor noted compared to previous. With that being said the wound itself overall does not appear to be too significant or worse. There is no event noted in the wound bed which is good news. With that being said the patient has  been worried as the home health nurse stated that she thought she saw bone in the base of the wound. Nonetheless there's a little bit of bruising along the left lateral border of the wound bed which again does not appear to be terrible we have noticed this before but other than this the majority the wound bed appears to be doing excellent in my pinion. I am wondering if there may be some bacterial colonization. This could be leading to some  of the odor that we are noting with the Wound VAC. Jessica [name], Jessica [name] ([id number]) 04/01/18 on evaluation today patient appears to actually be doing well in regard to the overall appearance of the wound itself. She also does not have any odor at this point in regard to the wound surface and I do believe the Bacons is extremely helpful in this regard. With that being said she does have a little bit of deep tissue injury on the medial aspect of the wound. Again last time it was more lateral. The lateral seems to have improved quite well. Nonetheless she also notes by way of her son that this has been very macerated in the past several weeks since I last saw her. Though things overall seem to be doing better I still think that the Wound VAC may help with managing her fluid better I'm wondering if we can attempt a gauze Wound VAC my hope is that this will be able to manage the moisture control as well as continuing with managing the odor of the wound and bacterial load since the Dakinos has done excellent in helping in that regard. 04/15/17 on evaluation today patient actually appears to be doing much better in regard to the overall appearance of her sacral wound. I do believe that the gauze Vac has been of benefit for her. Overall she is tolerating this without complication which is also good news. There is no sign of injury to the sacral region at this point. 04/29/18 on evaluation today patient appears to be doing well in regard to her sacral ulcer. She sure signs of  good granulation and the Gauze VAC is doing very well for her. Overall I'm pleased in this regard. The biggest issue is that the time from Friday till Monday seems to be quite long for her as far as the amount of time to keep the Wound VAC sealed. For that reason her son wonders if he can take this off on Sunday morning in order to give her day of just wet to dry dressing's with the Dakin's solution I think this would be okay. 05/13/18 on evaluation today patient appears to be doing okay in regard to her sacral ulcer. There does not appear to be any evidence of overt infection at this time which is good news. In general the drainage and moisture appears to be significantly improved this was after the pressure on the Wound VAC was increased after they contacted our office. With that being said a lot of the dark tissue around the edge of the wound has improved in the maceration is significantly better this seems to be managing much better. Fortunately there is no signs of infection at this time. Overall the patient is doing quite well. The wound measurements are not significantly smaller today although they were over the past two visits we will see how things go. 05/27/18 on evaluation today patient's sacral wound actually appears to be doing fairly well at this point. She does have a deep tissue injury yet again on the medial aspect of the wound which is something that we seem to be seeing often. There is some question as to whether or not this is actually being caused by the Wound VAC bunching up and then her somewhat laying on it even though they are attempting to offload this as best as possible. Patient son states that overall they do the best they can trying offload her and I definitely believe him. I may want to see about  putting the Wound VAC on hold for a couple weeks and see if this happens just with standard dressing changes. 06/10/18 on evaluation today patient actually appears to be doing  better in regard to her sacral wound. Overall there's no signs of infection, no odor, and no significant skin breakdown around the edge of the wound I feel like this is done much better 50 Dakinos soaked gauze dressing as opposed to legalizing anything such as the Wound VAC. I believe that we discontinue the Wound VAC as of today. 06/24/18 on evaluation today patient actually appears to be doing well in regard to her sacral ulcer. There's no signs of infection, no odor, and in my pinion no concerns at all. I feel like she is making good progress as far as I'm concerned. 07/12/18 on evaluation today patient appears to be doing very well in regard to her sacral ulcer. She has been tolerating the dressing changes without complication. Fortunately there does not appear to be any evidence of active infection at this time she is doing excellent as far as I'm concerned at this point. 08/11/18 on evaluation today patient's sacral wound actually appears to be doing quite well at this point. The main issue I see is that she continues to have some maceration and skin breakdown due to moisture over the right periwound location unfortunately. We've investigated the possibility of using zinc oxide before but have not utilized it simply due to the fact that she continues to state. Her son anyways that she is allergic to this. They have not tried it recently and may be completely unrelated to the reaction she had before. Nonetheless this may be something you want to attempt will discuss that in the plan. 10/04/18 on evaluation today patient actually appears to be doing quite well in regard to her sacral wound. It's been roughly 2 months since I last seen her in that time her wound has definitely made progress. This is very slow but again nonetheless does seem to be trending in the correct direction. Overall very pleased in this regard. No fevers, chills, nausea, or vomiting noted at this time. 11/07/2018 on evaluation  today patient appears to be doing decently well with regard to her sacral wound based on what I am seeing at this point. She is seen by way of a telehealth visit due to the COVID-19 national emergency and to be honest the patient is somewhat reluctant to come out to be seen for visits which I completely understand. They feel much safer keeping Jessica [name], Jessica [name]. ([id number]) her home as much as possible due to her adequate condition in general. The only issue that she has had recently she did have some wheezing and subsequently chest congestion she was just placed on albuterol along with prednisone at this point. She seems to be doing much better even after 24 hours of the prednisone according to her [name]. Overall they are pleased with how things seem to be progressing. Electronic Signature(s) Signed: 11/07/2018 4:29:28 PM By: Lenda Kelp PA-C Entered By: Lenda Kelp on 11/07/2018 16:29:28 Jessica [name] ([id number]) -------------------------------------------------------------------------------- Physical Exam Details [name]. Date of Service: 11/07/2018 4:00 PM Medical Record Number: [id number] Patient Account Number: [id number] Date of Birth/Sex: 09/21/1945 (73 y.o. F) Treating RN: Arnette Norris Primary Care Provider: Annita Brod Other Clinician: Referring Provider: Annita Brod Treating Provider/Extender: STONE III, Autumn Pruitt Weeks in Treatment: 75 Constitutional Well-nourished and well-hydrated in no acute distress. Respiratory normal breathing without difficulty. Psychiatric  this patient is able to make decisions and demonstrates good insight into disease process. Alert and Oriented x 3. pleasant and cooperative. Notes Patient's wound bed currently showed good granulation based on what I could see through the video feed. [name] tells me that he is only able to get to Dakin's soaked gauze into the undermining region at 12:00 where it used to  be able to easily get 3 if not a little bit more. It does seem like this is filling in slowly but surely which is a good news situation. Subsequently is also staying nice and clean which is good as well. Electronic Signature(s) Signed: 11/07/2018 4:30:27 PM By: Lenda Kelp PA-C Entered By: Lenda Kelp on 11/07/2018 16:30:27 Jessica [name] ([id number]) -------------------------------------------------------------------------------- Physician Orders Details [name]. Date of Service: 11/07/2018 4:00 PM Medical Record Number: [id number] Patient Account Number: [id number] Date of Birth/Sex: 1945-10-14 (73 y.o. F) Treating RN: Arnette Norris Primary Care Provider: Annita Brod Other Clinician: Referring Provider: Annita Brod Treating Provider/Extender: Linwood Dibbles, Prosper Paff Weeks in Treatment: 38 Verbal / Phone Orders: No Diagnosis Coding ICD-10 Coding Code Description L89.154 Pressure ulcer of sacral region, stage 4 M48.00 Spinal stenosis, site unspecified R54 Age-related physical debility G89.4 Chronic pain syndrome E78.2 Mixed hyperlipidemia L89.93 Pressure ulcer of unspecified site, stage 3 Wound Cleansing Wound #1 Medial Sacrum o Other: - please cleanse sacral wound with dakins moistened gauze - do not spray dakins on wound Anesthetic (add to Medication List) Wound #1 Medial Sacrum o Topical Lidocaine 4% cream applied to wound bed prior to debridement (In Clinic Only). Skin Barriers/Peri-Wound Care Wound #1 Medial Sacrum o Skin Prep Primary Wound Dressing Wound #1 Medial Sacrum o Pack wound with: - Dakin's soaked gauze Secondary Dressing Wound #1 Medial Sacrum o ABD pad - secure with tape Dressing Change Frequency Wound #1 Medial Sacrum o Change dressing twice daily. Follow-up Appointments Wound #1 Medial Sacrum o Return Appointment in 3 weeks. Off-Loading Wound #1 Medial Sacrum o Turn and reposition every 2  hours Slyter, Letticia W. ([id number]) Additional Orders / Instructions Wound #1 Medial Sacrum o Increase protein intake. Home Health Wound #1 Medial Sacrum o Continue Home Health Visits - [facility] o Home Health Nurse may visit PRN to address patientos wound care needs. o FACE TO FACE ENCOUNTER: MEDICARE and MEDICAID PATIENTS: I certify that this patient is under my care and that I had a face-to-face encounter that meets the physician face-to-face encounter requirements with this patient on this date. The encounter with the patient was in whole or in part for the following MEDICAL CONDITION: (primary reason for Home Healthcare) MEDICAL NECESSITY: I certify, that based on my findings, NURSING services are a medically necessary home health service. HOME BOUND STATUS: I certify that my clinical findings support that this patient is homebound (i.e., Due to illness or injury, pt requires aid of supportive devices such as crutches, cane, wheelchairs, walkers, the use of special transportation or the assistance of another person to leave their place of residence. There is a normal inability to leave the home and doing so requires considerable and taxing effort. Other absences are for medical reasons / religious services and are infrequent or of short duration when for other reasons). o If current dressing causes regression in wound condition, may D/C ordered dressing product/s and apply Normal Saline Moist Dressing daily until next Wound Healing Center / Other MD appointment. Notify Wound Healing Center of regression in wound condition at [phone]. o Please direct any  NON-WOUND related issues/requests for orders to patient's Primary Care Physician Electronic Signature(s) Signed: 11/07/2018 4:31:38 PM By: Lenda Kelp PA-C Entered By: Lenda Kelp on 11/07/2018 16:31:38 Jessica [name]  ([id number]) -------------------------------------------------------------------------------- Problem List Details [name]. Date of Service: 11/07/2018 4:00 PM Medical Record Number: [id number] Patient Account Number: [id number] Date of Birth/Sex: 30-Apr-1945 (73 y.o. F) Treating RN: Arnette Norris Primary Care Provider: Annita Brod Other Clinician: Referring Provider: Annita Brod Treating Provider/Extender: Linwood Dibbles, Suhani Stillion Weeks in Treatment: 75 Active Problems ICD-10 Evaluated Encounter Code Description Active Date Today Diagnosis L89.154 Pressure ulcer of sacral region, stage 4 05/27/2017 No Yes M48.00 Spinal stenosis, site unspecified 05/27/2017 No Yes R54 Age-related physical debility 05/27/2017 No Yes G89.4 Chronic pain syndrome 05/27/2017 No Yes E78.2 Mixed hyperlipidemia 05/27/2017 No Yes L89.93 Pressure ulcer of unspecified site, stage 3 05/27/2017 No Yes Inactive Problems Resolved Problems Electronic Signature(s) Signed: 11/07/2018 4:21:20 PM By: Lenda Kelp PA-C Entered By: Lenda Kelp on 11/07/2018 16:21:20 Jessica [name] ([id number]) -------------------------------------------------------------------------------- Progress Note Details [name]. Date of Service: 11/07/2018 4:00 PM Medical Record Number: [id number] Patient Account Number: [id number] Date of Birth/Sex: [birth year] (73 y.o. F) Treating RN: Arnette Norris Primary Care Provider: Annita Brod Other Clinician: Referring Provider: Annita Brod Treating Provider/Extender: Linwood Dibbles, Jahmiyah Dullea Weeks in Treatment: 75 Subjective Chief Complaint Information obtained from Patient she is here for evaluation of a sacral ulcer History of Present Illness (HPI) 05/27/17-she is here in initial evaluation for a left-sided sacral stage IV pressure ulcer and bilateral lower extremity, lateral aspect, unstageable pressure ulcers. She is accompanied by her husband  and her son, who are her primary caregivers. She is bedbound secondary to spinal stenosis. According to her son and husband she was hospitalized from 10/5-11/1 with healthcare associated pneumonia and altered mental status. During her hospitalization she was intubated, extubated on 10/27. She was discharged with Foley catheter and follow up with urology. According to her son and spouse she developed the sacral ulcer during hospitalization. Home health has been applying Santyl daily. She does have a low air loss mattress and is repositioned every 2-3 hours per her family's report. According to the son and spouse she had an appointment with urology on 12/18 and during that appointment developed discoloration to her bilateral lower extremities which ultimately developed into unstageable pressure ulcers to the lateral aspects of her bilateral lower extremities. There has been no topical treatment applied to these. She continues to have home health. There is no concerns expressed regarding dietary intake, stating she eats 3 meals a day, eating was provided; she is supplemented with boost with protein. 06/03/17-she is here in follow-up evaluation for sacral and bilateral lower extremity ulcers. Plain film x-ray done today reveals no distraction to the sacrum or coccyx, no visible abnormalities. Home health has ordered the negative pressure wound system but it has not been initiated. We will continue with Hydrofera Blue until initiation of negative pressure wound system and continue with Santyl to bilateral lower extremity ulcers. Follow-up next week 06/10/17-she is here in follow-up evaluation for sacral and bilateral lower extremity ulcers. The wound VAC will be available tomorrow per home health. We will initiate wound VAC therapy to the sacral ulcer 3 times weekly (Thursday, Saturday/Sunday, Tuesday). We will continue with Santyl to the lower extremity ulcers. The patient's son is checking into home  health therapy over the weekend for [facility] changes, with the understanding that if VAC changes cannot be performed over the  weekend he will need to change his mother's appointments to Monday, Wednesday or Friday. The sacral ulcer clinically does not appear infected but there has been a change in the amount of drainage acutely, there is no significant amount of devitalized tissue, there is no malodor. Wound culture was obtained to evaluate for occult infection we will hold off on antibiotic therapy until sensitivities are resulted. 06/18/17 on evaluation today patient appears to be doing fairly well in my opinion although this is the first time I have seen this patient she has been previously evaluated by Tacey RuizLeah here in our office. She is going to be switching to Fridays to see me due to the Wound VAC schedule being changed on Monday, Wednesday, and Friday. Subsequently she seems to be doing fairly well with the Wound VAC. Her son who was present during evaluation today states that he somewhat stresses of the Wound VAC in making sure that it was functioning appropriately. With that being said everything seems to be working well he [facility] and ensure that it is functioning properly. Overall the wound appears to be nice and clean there is no need for debridement today. She has no discomfort in her bilateral lower extremity ulcers also appear to be improving based on measurements and what this honest tell me about the overall appearance. 07/02/17 on evaluation today I noted in patients wound bed that was actually an odor that had not previously been noted. Subsequently there was also a small area of bone that was not previously noted during my last evaluation. This did appear to be necrotic and was being somewhat forced out by the body around the region of granulation. She does not have any pain which is good news. Nonetheless the overall appearance of the ulcer is making  me concerned for the patient having developed osteomyelitis. Currently she is not been on any antibiotics and the Wound VAC has been doing fairly well in general. With that being said I do not think we need to continue the Wound VAC if she potentially has a bone infection. At least not until it is properly addressed with antibiotics. Jessica [name], [name] ([id number]) 07/09/17 on evaluation today patient appears to actually be doing very well in regard to her sacral ulcer compared to last week. There is actually no exposed bone at this point. 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 With that being said still I do believe she needs to have a referral to infectious disease due to the osteomyelitis I am gonna recommend that she continue with the doxycycline based on the culture results which showed Eikenella Corrodens as the organism identified that tetracycline should work for. She does not seem to be having any discomfort whatsoever at this point. 07/16/17 on evaluation today patient actually appears to be doing rather well in regard to her sacral wound. I think that the original wound site actually appears much better than previously noted. With that being said she does have a new superficial injury on the right sacral region which appears to be due to according to the sign transport that occurred for her MRI unfortunately. Fortunately this is not too deep and I do think it can be managed but it was a new area that was not previously noted. Otherwise she has been tolerating the [employer]  s soaked gauze packing without complication. 07/30/17 on evaluation today patient presents for reevaluation concerning her sacral wound. She has been tolerating the dressing changes without complication. With that being said her wound is doing so well at this point  that I think she may be at the point where we could reinitiate the Wound VAC currently and hopefully see good results from this. I'm very pleased with how she has responded to the [facility] s soaked gauze packing. 08/13/17 evaluation today patient appears to be doing excellent in regard to her lower extremity ulcer this seems to be cleaning up very nicely. In regard to the sacral ulcer the area of trauma on the right lateral portion of the wound actually appears to be much better than previously noted during the last evaluation. The word has definitely filled in and the Wound VAC appears to be helpful I do believe. Overall I'm very pleased with how things seem to be progressing at this time. Patient likewise is also happy that things are doing well. 08/27/17 on evaluation today patient presents for follow-up concerning her ongoing issues with her sacral ulcer and lower extremity ulcer. Fortunately she has been tolerating the dressing changes well complication the Wound VAC in general seems to have done very well up to this point. She does not have any evidence of infection which is good news. She does have some dark discoloration in central portion of the wound which is troublesome for the possibility of pressure injury to the site it sounds like her prior air mattress was not functioning properly her son has just bought a new one for her this is doing much better. Otherwise things seem to be progressing nicely. 09/10/17 on evaluation today patient presents for follow-up concerning her sacral wound and lower extremity ulcer. She has been tolerating the switch of her dressing changes to the Dakin s soaked gauze dressing very well in regard to the sacrum. The left lateral lower extremity ulcer appears to have a new area open just distal to the one that we have been treating with Santyl and this is new since her last visit. There does not appear to be any evidence of infection which is good news. With that  being said there's a lot of maceration here at the site and I feel this is likely due to the switch and the dressings it appears that due to the sticking of the dressings it will switch to utilizing a telfa pad over the Santyl. I think this caused a lot of drainage to be collected and situated in the region just under the dressing which has led to this causing which duration of breakdown. Overall there does not appear to be any significant pain which is good news. Of note I did actually have a conversation with the radiologist who is an interventional radiologist with [facility]. 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 With that being said there does not appear to be any rush to get this done according to the interventional radiologist whom I spoke with. Therefore after discussion it was determined that there gonna wait about six months prior to considering the procedure to get time for hopefully the sacral wound to heal in the interim to at least some degree. 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 According to nursing staff these are all improved, the negative pressure wound therapy system was put  on hold last week. We will resume negative pressure wound therapy today, continue with Santyl to the lower extremity and she will follow-up next week. 09/24/17 on evaluation today unfortunately the patient's wound appears to be doing significantly worse compared to last week's evaluation and even my valuation the week before. She actually has bone noted on the left wound margin in the region of undermining unfortunately. This was not noted during the last evaluation. I am concerned about the fact that this seems to be worse not better since our last visit with her. My biggest concern is that she's likely developing a worsening osteomyelitis. This was discussed with patient  and her son today during the office visit. 05/03/17 on evaluation today patient actually appears to be doing rather well in regard to her sacral ulcer compared to last week's evaluation. She has been tolerating the dressing changes without complication. Fortunately even though we're not utilizing the Wound VAC she seems to be making some strides in seeing the wound area overall improved quite dramatically in my pinion in just one weeks time. She also seems to be staying off of this to the point that I do not see any evidence of new injury which is excellent news. Whatever she's been doing in that regard over the past week I would want her to continue. Jessica [name], Jessica [name] ([id number]) did also review the results of her bone culture which revealed group B strep as the responsible organism. Again this is what's causing her osteomyelitis she does have infectious disease appointment scheduled for 10/11/17 10/08/1898 valuation today patient appears to be doing better for the most part in regard to the sacral wound. Overall she is showing signs of having granulation of the bone which is good news. There is an area towards the left of the wound where she does seem to be showing some signs of opening this it would be due to additional pressure to the area unfortunately. There does not appear to be any evidence of infection spreading which is good news that do not see any evidence of significant purulent discharge which is also good news. 10/26/17 on evaluation today patient sacral ulcer actually appears to be very healthy and doing well in regard to the overall appearance of the wound. With that being said she does seem to be having some issues at this point in time with some shear/friction injury of the sacrum from where she was transported to [facility] for her infectious disease appointment. She has been placed on IV antibiotic therapy I will have to go back and find that note for review as I did not have  access to that today. Nonetheless I will add the next visit be sure to document the exact antibiotics that she is taking at this point. Nonetheless I do believe the wound is doing better there's no bone exposure nothing that requires debridement in regard to the sacral wound. Likewise her left lateral lower extremity ulcer also appears to be doing significantly better. In general I feel like things are showing signs of improvement all around. 11/12/17 on evaluation today patient appears to be doing rather well in regard to her sacral wound. In general I do see signs of granulation at this point. Fortunately there does not appear to be any evidence of infection which is good news as well. In general overall happy with the appearance. With that being said I'll be see I would like for this to be progressing more rapidly as with the patient and  her son but nonetheless at least things do not appear to be doing worse. Her lower extremity ulcer is doing significantly better. 11/26/17 on evaluation today patient appears to be doing a little better in regard to the sacral wound especially in the peripheral of the wound bed. She actually did have an abrasion on the right gluteal region that's completely resolved to this point. Her lower extremity also appears to be for the most part completely resolved. Overall the sacrum itself seems to be the one thing that is still open and given her trouble. No fevers, chills, nausea, or vomiting noted at this time. She actually has an appointment next week with infectious disease for recheck to see how things are doing. She maybe switch to oral medications at that point. 12/09/17 on evaluation today patient actually appears to be doing much better in regard to her sacral wound. I do feel like she has made good progress at this time as far as healing is concerned. In fact the wound looks better to me today than it has in quite some time. She is on oral antibiotic therapy and  no longer on the IV antibiotics. She did see infectious disease last week. 12/24/17 on evaluation today patient's wounds actually appear to be doing decently well in regard to the sacral wound in particular. When it comes to her lower extremity ulcers both appear to be completely healed at this point in the one area where she had lived the rash has completely resolved at this time. Overall very pleased with the progress that has been made. Nonetheless the patient seems to be doing well in general. She still does have quite a bit of healing to do in regard to the sacral wound but I feel like she is making progress. 01/07/18 on evaluation today patient actually appears to be doing excellent today regard to her sacral wound. The granular quality in the base of the wound is excellent and shown signs of good improvement there's no evidence of contusion nor deep tissue injury and the periwound actually appears to be doing well. In general I'm very pleased with the overall appearance. 02/04/18 on evaluation today patient's wound actually appears to be doing decently well as far as the granulation is concerned. She has been tolerating the dressing changes without complication. Right now we are still using the Wound VAC. Nonetheless overall there apparently is some cater to the wound and this is concerning simply for the fact that she could be developing a soft tissue infection again. She's had this happen in the past and at that time responded very well to the antibiotics. Nonetheless I don't think I would likely put her on anything prophylactically but rather wait for the results of the culture to return. 02/18/18 on evaluation today patient actually appears to be doing fairly well. She has been tolerating the dressing changes. And the Wound VAC seems to be doing well. Overall I'm very pleased with how things have improved over the past couple weeks since I last saw her. Fortunately there does not appear to  be any evidence of overt infection at this time which is good news. She has been taking the antibiotics without complication. Specifically that is the Bactrim DS which was prescribed on 02/08/18. 03/18/18 on evaluation today patient's wound actually does have quite a bit of odor noted compared to previous. With that Jessica [name], [name] ([id number]) being said the wound itself overall does not appear to be too significant or worse. There is no  event noted in the wound bed which is good news. With that being said the patient has been worried as the home health nurse stated that she thought she saw bone in the base of the wound. Nonetheless there's a little bit of bruising along the left lateral border of the wound bed which again does not appear to be terrible we have noticed this before but other than this the majority the wound bed appears to be doing excellent in my pinion. I am wondering if there may be some bacterial colonization. This could be leading to some of the odor that we are noting with the Wound VAC. 04/01/18 on evaluation today patient appears to actually be doing well in regard to the overall appearance of the wound itself. She also does not have any odor at this point in regard to the wound surface and I do believe the Bacons is extremely helpful in this regard. With that being said she does have a little bit of deep tissue injury on the medial aspect of the wound. Again last time it was more lateral. The lateral seems to have improved quite well. Nonetheless she also notes by way of her son that this has been very macerated in the past several weeks since I last saw her. Though things overall seem to be doing better I still think that the Wound VAC may help with managing her fluid better I'm wondering if we can attempt a gauze Wound VAC my hope is that this will be able to manage the moisture control as well as continuing with managing the odor of the wound and bacterial load since  the Dakin s has done excellent in helping in that regard. 04/15/17 on evaluation today patient actually appears to be doing much better in regard to the overall appearance of her sacral wound. I do believe that the gauze Vac has been of benefit for her. Overall she is tolerating this without complication which is also good news. There is no sign of injury to the sacral region at this point. 04/29/18 on evaluation today patient appears to be doing well in regard to her sacral ulcer. She sure signs of good granulation and the Gauze VAC is doing very well for her. Overall I'm pleased in this regard. The biggest issue is that the time from Friday till Monday seems to be quite long for her as far as the amount of time to keep the Wound VAC sealed. For that reason her son wonders if he can take this off on Sunday morning in order to give her day of just wet to dry dressing's with the Dakin's solution I think this would be okay. 05/13/18 on evaluation today patient appears to be doing okay in regard to her sacral ulcer. There does not appear to be any evidence of overt infection at this time which is good news. In general the drainage and moisture appears to be significantly improved this was after the pressure on the Wound VAC was increased after they contacted our office. With that being said a lot of the dark tissue around the edge of the wound has improved in the maceration is significantly better this seems to be managing much better. Fortunately there is no signs of infection at this time. Overall the patient is doing quite well. The wound measurements are not significantly smaller today although they were over the past two visits we will see how things go. 05/27/18 on evaluation today patient's sacral wound actually appears to be  doing fairly well at this point. She does have a deep tissue injury yet again on the medial aspect of the wound which is something that we seem to be seeing often. There is  some question as to whether or not this is actually being caused by the Wound VAC bunching up and then her somewhat laying on it even though they are attempting to offload this as best as possible. Patient son states that overall they do the best they can trying offload her and I definitely believe him. I may want to see about putting the Wound VAC on hold for a couple weeks and see if this happens just with standard dressing changes. 06/10/18 on evaluation today patient actually appears to be doing better in regard to her sacral wound. Overall there's no signs of infection, no odor, and no significant skin breakdown around the edge of the wound I feel like this is done much better 50 Dakin s soaked gauze dressing as opposed to legalizing anything such as the Wound VAC. I believe that we discontinue the Wound VAC as of today. 06/24/18 on evaluation today patient actually appears to be doing well in regard to her sacral ulcer. There's no signs of infection, no odor, and in my pinion no concerns at all. I feel like she is making good progress as far as I'm concerned. 07/12/18 on evaluation today patient appears to be doing very well in regard to her sacral ulcer. She has been tolerating the dressing changes without complication. Fortunately there does not appear to be any evidence of active infection at this time she is doing excellent as far as I'm concerned at this point. 08/11/18 on evaluation today patient's sacral wound actually appears to be doing quite well at this point. The main issue I see is that she continues to have some maceration and skin breakdown due to moisture over the right periwound location unfortunately. We've investigated the possibility of using zinc oxide before but have not utilized it simply due to the fact that she continues to state. Her son anyways that she is allergic to this. They have not tried it recently and may be completely unrelated to the reaction she had before.  Nonetheless this may be something you want to attempt will discuss that in the plan. 10/04/18 on evaluation today patient actually appears to be doing quite well in regard to her sacral wound. It's been roughly 2 months since I last seen her in that time her wound has definitely made progress. This is very slow but again nonetheless Jessica [name], Jessica [name]. ([id number]) does seem to be trending in the correct direction. Overall very pleased in this regard. No fevers, chills, nausea, or vomiting noted at this time. 11/07/2018 on evaluation today patient appears to be doing decently well with regard to her sacral wound based on what I am seeing at this point. She is seen by way of a telehealth visit due to the COVID-19 national emergency and to be honest the patient is somewhat reluctant to come out to be seen for visits which I completely understand. They feel much safer keeping her home as much as possible due to her adequate condition in general. The only issue that she has had recently she did have some wheezing and subsequently chest congestion she was just placed on albuterol along with prednisone at this point. She seems to be doing much better even after 24 hours of the prednisone according to her [name]. Overall they are pleased with  how things seem to be progressing. Patient History Information obtained from Patient. Allergies zinc, Ambien Family History No family history of Cancer, Diabetes, Heart Disease. Social History Never smoker, Marital Status - Married. Review of Systems (ROS) Constitutional Symptoms (General Health) Denies complaints or symptoms of Fatigue, Fever, Chills, Marked Weight Change. Respiratory Denies complaints or symptoms of Chronic or frequent coughs, Shortness of Breath. Cardiovascular Denies complaints or symptoms of Chest pain, LE edema. Psychiatric Denies complaints or symptoms of Anxiety, Claustrophobia. Objective Constitutional Well-nourished and  well-hydrated in no acute distress. Respiratory normal breathing without difficulty. Psychiatric this patient is able to make decisions and demonstrates good insight into disease process. Alert and Oriented x 3. pleasant and cooperative. General Notes: Patient's wound bed currently showed good granulation based on what I could see through the video feed. [name] tells me that he is only able to get to Dakin's soaked gauze into the undermining region at 12:00 where it used to be able to easily get 3 if not a little bit more. It does seem like this is filling in slowly but surely which is a good news situation. Jessica [name], Jessica [name]. ([id number]) Subsequently is also staying nice and clean which is good as well. 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 Skin Barriers/Peri-Wound Care: Wound #1 Medial Sacrum: Skin Prep Primary Wound Dressing: Wound #1 Medial Sacrum: Pack wound with: - Dakin's soaked gauze Secondary Dressing: Wound #1 Medial Sacrum: ABD pad - secure with tape Dressing Change Frequency: Wound #1 Medial Sacrum: Change dressing twice daily. Follow-up Appointments: Wound #1 Medial Sacrum: Return Appointment in 3 weeks. Off-Loading: Wound #1 Medial Sacrum: Turn and reposition every 2 hours Additional Orders / Instructions: Wound #1 Medial Sacrum: Increase protein intake. Home Health: Wound #1 Medial Sacrum: Continue Home Health Visits - [facility] Nurse may visit PRN to address patient s  wound care needs. FACE TO FACE ENCOUNTER: MEDICARE and MEDICAID PATIENTS: I certify that this patient is under my care and that I had a face-to-face encounter that meets the physician face-to-face encounter requirements with this patient on this date. The encounter with the patient was in whole or in part for the following MEDICAL CONDITION: (primary reason for Home AANSHI, BATCHELDER. ([id number]) Healthcare) MEDICAL NECESSITY: I certify, that based on my findings, NURSING services are a medically necessary home health service. HOME BOUND STATUS: I certify that my clinical findings support that this patient is homebound (i.e., Due to illness or injury, pt requires aid of supportive devices such as crutches, cane, wheelchairs, walkers, the use of special transportation or the assistance of another person to leave their place of residence. There is a normal inability to leave the home and doing so requires considerable and taxing effort. Other absences are for medical reasons / religious services and are infrequent or of short duration when for other reasons). If current dressing causes regression in wound condition, may D/C ordered dressing product/s and apply Normal Saline Moist Dressing daily until next Wound Healing Center / Other MD appointment. Notify Wound Healing Center of regression in wound condition at [phone]. Please direct  any NON-WOUND related issues/requests for orders to patient's Primary Care Physician 1. I am going to recommend that we continue with the Dakin's soaked gauze dressings to the sacral region as this seems to be doing very well it is keeping the area nice and clean and free of necrotic debris. 2. I am going to suggest that we continue with appropriate offloading as well this seems to be doing well for her and they are very good about this. 3. We will continue with home health as previous. At this point my suggestion is that we go ahead and see her back for  reevaluation in 2 weeks time by way of a telehealth visit since she still seems to be somewhat reluctant to come out for an in person visit. I completely understand her concern and I think we can definitely accommodate her in this regard since she does seem to be doing quite nicely from the standpoint of healing. We will see her back for reevaluation in 1 works time. If anything changes they will contact the office and let me know. Electronic Signature(s) Signed: 11/07/2018 4:33:24 PM By: Lenda Kelp PA-C Entered By: Lenda Kelp on 11/07/2018 16:33:24 Jessica [name] ([id number]) -------------------------------------------------------------------------------- ROS/PFSH Details [name]. Date of Service: 11/07/2018 4:00 PM Medical Record Number: [id number] Patient Account Number: [id number] Date of Birth/Sex: 10-12-1945 (73 y.o. F) Treating RN: Arnette Norris Primary Care Provider: Annita Brod Other Clinician: Referring Provider: Annita Brod Treating Provider/Extender: STONE III, Jaelynn Currier Weeks in Treatment: 75 Information Obtained From Patient Constitutional Symptoms (General Health) Complaints and Symptoms: Negative for: Fatigue; Fever; Chills; Marked Weight Change Respiratory Complaints and Symptoms: Negative for: Chronic or frequent coughs; Shortness of Breath Cardiovascular Complaints and Symptoms: Negative for: Chest pain; LE edema Psychiatric Complaints and Symptoms: Negative for: Anxiety; Claustrophobia Immunizations Pneumococcal Vaccine: Received Pneumococcal Vaccination: No Implantable Devices No devices added Family and Social History Cancer: No; Diabetes: No; Heart Disease: No; Never smoker; Marital Status - Married Physician Affirmation I have reviewed and agree with the above information. Electronic Signature(s) Signed: 11/08/2018 4:21:20 PM By: Arnette Norris Signed: 11/09/2018 9:47:59 AM By: Lenda Kelp PA-C Entered By:  Lenda Kelp on 11/07/2018 16:21:13 Jessica [name] ([id number]) -------------------------------------------------------------------------------- SuperBill Details [name]. Date of Service: 11/07/2018 Medical Record Number: [id number] Patient Account Number: [id number] Date of Birth/Sex: Jan 03, 1946 (73 y.o. F) Treating RN: Arnette Norris Primary Care Provider: Annita Brod Other Clinician: Referring Provider: Annita Brod Treating Provider/Extender: STONE III, Jenisa Monty Weeks in Treatment: 75 Diagnosis Coding ICD-10 Codes Code Description L89.154 Pressure ulcer of sacral region, stage 4 M48.00 Spinal stenosis, site unspecified R54 Age-related physical debility G89.4 Chronic pain syndrome E78.2 Mixed hyperlipidemia L89.93 Pressure ulcer of unspecified site, stage 3 Physician Procedures CPT4 Code: [id number] Description: 99213 - WC PHYS LEVEL 3 - EST PT ICD-10 Diagnosis Description L89.154 Pressure ulcer of sacral region, stage 4 M48.00 Spinal stenosis, site unspecified R54 Age-related physical debility G89.4 Chronic pain syndrome Modifier: Quantity: 1 Electronic Signature(s) Signed: 11/07/2018 4:33:41 PM By: Lenda Kelp PA-C Entered By: Lenda Kelp on 11/07/2018 16:33:41

## 2018-11-21 ENCOUNTER — Ambulatory Visit: Payer: Medicare Other | Admitting: Physician Assistant

## 2018-11-28 ENCOUNTER — Encounter: Payer: Medicare Other | Attending: Physician Assistant | Admitting: Physician Assistant

## 2018-11-28 NOTE — Progress Notes (Signed)
Jessica BradyOVERMAN, Toneisha W. ([id number]) Visit Report for 11/28/2018 Chief Complaint Document Details Patient Name: Jessica BradyOVERMAN, Kareema W. Date of Service: 11/28/2018 4:00 PM Medical Record Number: [id number] Patient Account Number: [id number] Date of Birth/Sex: 1945-04-23 30(73 y.o. F) Treating RN: Arnette NorrisBiell, Kristina Primary Care Provider: Annita BrodASENSO, PHILIP Other Clinician: Referring Provider: Annita BrodASENSO, PHILIP Treating Provider/Extender: Linwood DibblesSTONE III, HOYT Weeks in Treatment: 4878 Information Obtained from: Patient Chief Complaint she is here for evaluation of a sacral ulcer Electronic Signature(s) Signed: 11/28/2018 4:27:16 PM By: Lenda KelpStone III, Hoyt PA-C Entered By: Lenda KelpStone III, Hoyt on 11/28/2018 16:27:16 Jessica BradyVERMAN, Lameka W. ([id number]) -------------------------------------------------------------------------------- HPI Details Patient Name: Jessica BradyVERMAN, Hamda W. Date of Service: 11/28/2018 4:00 PM Medical Record Number: [id number] Patient Account Number: [id number] Date of Birth/Sex: 1945-04-23 71(73 y.o. F) Treating RN: Arnette NorrisBiell, Kristina Primary Care Provider: Annita BrodASENSO, PHILIP Other Clinician: Referring Provider: Annita BrodASENSO, PHILIP Treating Provider/Extender: Linwood DibblesSTONE III, HOYT Weeks in Treatment: 3678 History of Present Illness HPI Description: 05/27/17-she is here in initial evaluation for a left-sided sacral stage IV pressure ulcer and bilateral lower extremity, lateral aspect, unstageable pressure ulcers. She is accompanied by her husband and her son, who are her primary caregivers. She is bedbound secondary to spinal stenosis. According to her son and husband she was hospitalized from 10/5-11/1 with healthcare associated pneumonia and altered mental status. During her hospitalization she was intubated, extubated on 10/27. She was discharged with Foley catheter and follow up with urology. According to her son and spouse she developed the sacral ulcer during hospitalization. Home health has been applying Santyl daily. She does  have a low air loss mattress and is repositioned every 2-3 hours per her family's report. According to the son and spouse she had an appointment with urology on 12/18 and during that appointment developed discoloration to her bilateral lower extremities which ultimately developed into unstageable pressure ulcers to the lateral aspects of her bilateral lower extremities. There has been no topical treatment applied to these. She continues to have home health. There is no concerns expressed regarding dietary intake, stating she eats 3 meals a day, eating was provided; she is supplemented with boost with protein. 06/03/17-she is here in follow-up evaluation for sacral and bilateral lower extremity ulcers. Plain film x-ray done today reveals no distraction to the sacrum or coccyx, no visible abnormalities. Home health has ordered the negative pressure wound system but it has not been initiated. We will continue with Hydrofera Blue until initiation of negative pressure wound system and continue with Santyl to bilateral lower extremity ulcers. Follow-up next week 06/10/17-she is here in follow-up evaluation for sacral and bilateral lower extremity ulcers. The wound VAC will be available tomorrow per home health. We will initiate wound VAC therapy to the sacral ulcer 3 times weekly (Thursday, Saturday/Sunday, Tuesday). We will continue with Santyl to the lower extremity ulcers. The patient's son is checking [facility] with the understanding that if VAC changes cannot be performed over the weekend he will need to change his mother's appointments to Monday, Wednesday or Friday. The sacral ulcer clinically does not appear infected but there has been a change in the amount of drainage acutely, there is no significant amount of devitalized tissue, there is no malodor. Wound culture was obtained to evaluate for occult infection we will hold off on antibiotic therapy  until sensitivities are resulted. 06/18/17 on evaluation today patient appears to be doing fairly well in my opinion although this is the first time I have seen this patient she  has been previously evaluated by Denny Peon here in our office. She is going to be switching to Fridays to see me due to the Wound VAC schedule being changed on Monday, Wednesday, and Friday. Subsequently she seems to be doing fairly well with the Wound VAC. Her son who was present during evaluation today states that he somewhat stresses of the Wound VAC in making sure that it was functioning appropriately. With that being said everything seems to be working well he knows what settings on the [facility] itself to look at and ensure that it is functioning properly. Overall the wound appears to be nice and clean there is no need for debridement today. She has no discomfort in her bilateral lower extremity ulcers also appear to be improving based on measurements and what this honest tell me about the overall appearance. 07/02/17 on evaluation today I noted in patients wound bed that was actually an odor that had not previously been noted. Subsequently there was also a small area of bone that was not previously noted during my last evaluation. This did appear to be necrotic and was being somewhat forced out by the body around the region of granulation. She does not have any pain which is good news. Nonetheless the overall appearance of the ulcer is making me concerned for the patient having developed osteomyelitis. Currently she is not been on any antibiotics and the Wound VAC has been doing fairly well in general. With that being said I do not think we need to continue the Wound VAC if she potentially has a bone infection. At least not until it is properly addressed with antibiotics. 07/09/17 on evaluation today patient appears to actually be doing very well in regard to her sacral ulcer compared to last week. There is actually no exposed bone  at this point. 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 With that being said still I do believe she needs to have a referral to infectious disease due to the osteomyelitis I am gonna recommend that she continue with the doxycycline based on the culture results which showed Eikenella Corrodens as the DEZIRAE, SERVICE. ([id number]) organism identified that tetracycline should work for. She does not seem to be having any discomfort whatsoever at this point. 07/16/17 on evaluation today patient actually appears to be doing rather well in regard to her sacral wound. I think that the original wound site actually appears much better than previously noted. With that being said she does have a new superficial injury on the right sacral region which appears to be due to according to the sign transport that occurred for her MRI unfortunately. Fortunately this is not too deep and I do think it can be managed but it was a new area that was not previously noted. Otherwise she has been tolerating the Dakinos soaked gauze packing without complication. 07/30/17 on evaluation today patient presents for reevaluation concerning her sacral wound. She has been tolerating the dressing changes without complication. With that being said her wound is doing so well at this point that I think she may be at the point where we could reinitiate the Wound VAC currently and hopefully see good results from this. I'm very pleased with how she has responded to the Dakinos soaked gauze packing. 08/13/17 evaluation today patient appears to be doing excellent in regard to her lower extremity  ulcer this seems to be cleaning up very nicely. In regard to the sacral ulcer the area of trauma on the right lateral portion of the wound actually appears to be much better than previously noted  during the last evaluation. The word has definitely filled in and the Wound VAC appears to be helpful I do believe. Overall I'm very pleased with how things seem to be progressing at this time. Patient likewise is also happy that things are doing well. 08/27/17 on evaluation today patient presents for follow-up concerning her ongoing issues with her sacral ulcer and lower extremity ulcer. Fortunately she has been tolerating the dressing changes well complication the Wound VAC in general seems to have done very well up to this point. She does not have any evidence of infection which is good news. She does have some dark discoloration in central portion of the wound which is troublesome for the possibility of pressure injury to the site it sounds like her prior air mattress was not functioning properly her son has just bought a new one for her this is doing much better. Otherwise things seem to be progressing nicely. 09/10/17 on evaluation today patient presents for follow-up concerning her sacral wound and lower extremity ulcer. She has been tolerating the switch of her dressing changes to the Dakinos soaked gauze dressing very well in regard to the sacrum. The left lateral lower extremity ulcer appears to have a new area open just distal to the one that we have been treating with Santyl and this is new since her last visit. There does not appear to be any evidence of infection which is good news. With that being said there's a lot of maceration here at the site and I feel this is likely due to the switch and the dressings it appears that due to the sticking of the dressings it will switch to utilizing a telfa pad over the Santyl. I think this caused a lot of drainage to be collected and situated in the region just under the dressing which has led to this causing which duration of breakdown. Overall there does not appear to be any significant pain which is good news. Of note I did actually have a  conversation with the radiologist who is an interventional radiologist with [facility]. 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 With that being said there does not appear to be any rush to get this done according to the interventional radiologist whom I spoke with. Therefore after discussion it was determined that there gonna wait about six months prior to considering the procedure to get time for hopefully the sacral wound to heal in the interim to at least some degree. 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 According to nursing staff these are all improved, the negative pressure wound therapy system was put on hold last week. We will resume negative pressure wound therapy today, continue with Santyl to the lower extremity and she will follow-up next week. 09/24/17 on evaluation today unfortunately the patient's wound appears to be doing significantly worse compared to last week's evaluation and even my valuation the week before. She actually has bone noted on the left wound margin in the region of undermining unfortunately. This was not noted during the last evaluation. I am concerned about the fact that this seems to be worse not better since our last visit with her.  My biggest concern is that she's likely developing a worsening osteomyelitis. This was discussed with patient and her son today during the office visit. 05/03/17 on evaluation today patient actually appears to be doing rather well in regard to her sacral ulcer compared to last week's evaluation. She has been tolerating the dressing changes without complication. Fortunately even though we're not utilizing the Wound VAC she seems to be making some strides in seeing the wound area overall improved quite dramatically in my pinion in just one weeks time. She also seems to be staying off of this to the point  that I do not see any evidence of new injury which is excellent news. Whatever she's been doing in that regard over the past week I would want her to continue. I did also review the results of her bone culture which revealed group B strep as the responsible organism. Again this is what's causing her osteomyelitis she does have infectious disease appointment scheduled for 10/11/17 10/08/1898 valuation today patient appears to be doing better for the most part in regard to the sacral wound. Overall she is showing signs of having granulation of the bone which is good news. There is an area towards the left of the wound where she does seem to be showing some signs of opening this it would be due to additional pressure to the area unfortunately. RAMANDA, PAULES. ([id number]) There does not appear to be any evidence of infection spreading which is good news that do not see any evidence of significant purulent discharge which is also good news. 10/26/17 on evaluation today patient sacral ulcer actually appears to be very healthy and doing well in regard to the overall appearance of the wound. With that being said she does seem to be having some issues at this point in time with some shear/friction injury of the sacrum from where she was transported to [facility] for her infectious disease appointment. She has been placed on IV antibiotic therapy I will have to go back and find that note for review as I did not have access to that today. Nonetheless I will add the next visit be sure to document the exact antibiotics that she is taking at this point. Nonetheless I do believe the wound is doing better there's no bone exposure nothing that requires debridement in regard to the sacral wound. Likewise her left lateral lower extremity ulcer also appears to be doing significantly better. In general I feel like things are showing signs of improvement all around. 11/12/17 on evaluation today patient appears to be doing  rather well in regard to her sacral wound. In general I do see signs of granulation at this point. Fortunately there does not appear to be any evidence of infection which is good news as well. In general overall happy with the appearance. With that being said I'll be see I would like for this to be progressing more rapidly as with the patient and her son but nonetheless at least things do not appear to be doing worse. Her lower extremity ulcer is doing significantly better. 11/26/17 on evaluation today patient appears to be doing a little better in regard to the sacral wound especially in the peripheral of the wound bed. She actually did have an abrasion on the right gluteal region that's completely resolved to this point. Her lower extremity also appears to be for the most part completely resolved. Overall the sacrum itself seems to be the one thing that is still open and given her  trouble. No fevers, chills, nausea, or vomiting noted at this time. She actually has an appointment next week with infectious disease for recheck to see how things are doing. She maybe switch to oral medications at that point. 12/09/17 on evaluation today patient actually appears to be doing much better in regard to her sacral wound. I do feel like she has made good progress at this time as far as healing is concerned. In fact the wound looks better to me today than it has in quite some time. She is on oral antibiotic therapy and no longer on the IV antibiotics. She did see infectious disease last week. 12/24/17 on evaluation today patient's wounds actually appear to be doing decently well in regard to the sacral wound in particular. When it comes to her lower extremity ulcers both appear to be completely healed at this point in the one area where she had lived the rash has completely resolved at this time. Overall very pleased with the progress that has been made. Nonetheless the patient seems to be doing well in general.  She still does have quite a bit of healing to do in regard to the sacral wound but I feel like she is making progress. 01/07/18 on evaluation today patient actually appears to be doing excellent today regard to her sacral wound. The granular quality in the base of the wound is excellent and shown signs of good improvement there's no evidence of contusion nor deep tissue injury and the periwound actually appears to be doing well. In general I'm very pleased with the overall appearance. 02/04/18 on evaluation today patient's wound actually appears to be doing decently well as far as the granulation is concerned. She has been tolerating the dressing changes without complication. Right now we are still using the Wound VAC. Nonetheless overall there apparently is some cater to the wound and this is concerning simply for the fact that she could be developing a soft tissue infection again. She's had this happen in the past and at that time responded very well to the antibiotics. Nonetheless I don't think I would likely put her on anything prophylactically but rather wait for the results of the culture to return. 02/18/18 on evaluation today patient actually appears to be doing fairly well. She has been tolerating the dressing changes. And the Wound VAC seems to be doing well. Overall I'm very pleased with how things have improved over the past couple weeks since I last saw her. Fortunately there does not appear to be any evidence of overt infection at this time which is good news. She has been taking the antibiotics without complication. Specifically that is the Bactrim DS which was prescribed on 02/08/18. 03/18/18 on evaluation today patient's wound actually does have quite a bit of odor noted compared to previous. With that being said the wound itself overall does not appear to be too significant or worse. There is no event noted in the wound bed which is good news. With that being said the patient has  been worried as the home health nurse stated that she thought she saw bone in the base of the wound. Nonetheless there's a little bit of bruising along the left lateral border of the wound bed which again does not appear to be terrible we have noticed this before but other than this the majority the wound bed appears to be doing excellent in my pinion. I am wondering if there may be some bacterial colonization. This could be leading to some  of the odor that we are noting with the Wound VAC. BERENISE, HUNTON ([id number]) 04/01/18 on evaluation today patient appears to actually be doing well in regard to the overall appearance of the wound itself. She also does not have any odor at this point in regard to the wound surface and I do believe the Bacons is extremely helpful in this regard. With that being said she does have a little bit of deep tissue injury on the medial aspect of the wound. Again last time it was more lateral. The lateral seems to have improved quite well. Nonetheless she also notes by way of her son that this has been very macerated in the past several weeks since I last saw her. Though things overall seem to be doing better I still think that the Wound VAC may help with managing her fluid better I'm wondering if we can attempt a gauze Wound VAC my hope is that this will be able to manage the moisture control as well as continuing with managing the odor of the wound and bacterial load since the Dakinos has done excellent in helping in that regard. 04/15/17 on evaluation today patient actually appears to be doing much better in regard to the overall appearance of her sacral wound. I do believe that the gauze Vac has been of benefit for her. Overall she is tolerating this without complication which is also good news. There is no sign of injury to the sacral region at this point. 04/29/18 on evaluation today patient appears to be doing well in regard to her sacral ulcer. She sure signs of  good granulation and the Gauze VAC is doing very well for her. Overall I'm pleased in this regard. The biggest issue is that the time from Friday till Monday seems to be quite long for her as far as the amount of time to keep the Wound VAC sealed. For that reason her son wonders if he can take this off on Sunday morning in order to give her day of just wet to dry dressing's with the Dakin's solution I think this would be okay. 05/13/18 on evaluation today patient appears to be doing okay in regard to her sacral ulcer. There does not appear to be any evidence of overt infection at this time which is good news. In general the drainage and moisture appears to be significantly improved this was after the pressure on the Wound VAC was increased after they contacted our office. With that being said a lot of the dark tissue around the edge of the wound has improved in the maceration is significantly better this seems to be managing much better. Fortunately there is no signs of infection at this time. Overall the patient is doing quite well. The wound measurements are not significantly smaller today although they were over the past two visits we will see how things go. 05/27/18 on evaluation today patient's sacral wound actually appears to be doing fairly well at this point. She does have a deep tissue injury yet again on the medial aspect of the wound which is something that we seem to be seeing often. There is some question as to whether or not this is actually being caused by the Wound VAC bunching up and then her somewhat laying on it even though they are attempting to offload this as best as possible. Patient son states that overall they do the best they can trying offload her and I definitely believe him. I may want to see about  putting the Wound VAC on hold for a couple weeks and see if this happens just with standard dressing changes. 06/10/18 on evaluation today patient actually appears to be doing  better in regard to her sacral wound. Overall there's no signs of infection, no odor, and no significant skin breakdown around the edge of the wound I feel like this is done much better 50 Dakinos soaked gauze dressing as opposed to legalizing anything such as the Wound VAC. I believe that we discontinue the Wound VAC as of today. 06/24/18 on evaluation today patient actually appears to be doing well in regard to her sacral ulcer. There's no signs of infection, no odor, and in my pinion no concerns at all. I feel like she is making good progress as far as I'm concerned. 07/12/18 on evaluation today patient appears to be doing very well in regard to her sacral ulcer. She has been tolerating the dressing changes without complication. Fortunately there does not appear to be any evidence of active infection at this time she is doing excellent as far as I'm concerned at this point. 08/11/18 on evaluation today patient's sacral wound actually appears to be doing quite well at this point. The main issue I see is that she continues to have some maceration and skin breakdown due to moisture over the right periwound location unfortunately. We've investigated the possibility of using zinc oxide before but have not utilized it simply due to the fact that she continues to state. Her son anyways that she is allergic to this. They have not tried it recently and may be completely unrelated to the reaction she had before. Nonetheless this may be something you want to attempt will discuss that in the plan. 10/04/18 on evaluation today patient actually appears to be doing quite well in regard to her sacral wound. It's been roughly 2 months since I last seen her in that time her wound has definitely made progress. This is very slow but again nonetheless does seem to be trending in the correct direction. Overall very pleased in this regard. No fevers, chills, nausea, or vomiting noted at this time. 11/07/2018 on evaluation  today patient appears to be doing decently well with regard to her sacral wound based on what I am seeing at this point. She is seen by way of a telehealth visit due to the COVID-19 national emergency and to be honest the patient is somewhat reluctant to come out to be seen for visits which I completely understand. They feel much safer keeping Jessica BradyOVERMAN, Renesmae W. ([id number]) her home as much as possible due to her adequate condition in general. The only issue that she has had recently she did have some wheezing and subsequently chest congestion she was just placed on albuterol along with prednisone at this point. She seems to be doing much better even after 24 hours of the prednisone according to her son [name]. Overall they are pleased with how things seem to be progressing. 11/28/2018 upon evaluation today patient is seen by way of telehealth visit since she is still somewhat reluctant to come out due to the COVID-19 pandemic. Subsequently she seems to be doing well and infected I could not obtain actual measurements for the wound the overall appearance of the wound is dramatically improved. She does not appear to have nearly as much space at the 12:00 location and in fact her son [name] who is taking care of her mainly states that he is barely able to get anything up into  that region is filled and so nicely. Overall I feel like she is making great progress. Electronic Signature(s) Signed: 11/28/2018 5:13:21 PM By: Lenda Kelp PA-C Entered By: Lenda Kelp on 11/28/2018 17:13:20 Jessica [name] ([id number]) -------------------------------------------------------------------------------- Physical Exam Details [name]. Date of Service: 11/28/2018 4:00 PM Medical Record Number: [id number] Patient Account Number: [id number] Date of Birth/Sex: 10/09/1945 (73 y.o. F) Treating RN: Arnette Norris Primary Care Provider: Annita Brod Other Clinician: Referring  Provider: Annita Brod Treating Provider/Extender: STONE III, HOYT Weeks in Treatment: 1 Constitutional Obese and well-hydrated in no acute distress. Respiratory normal breathing without difficulty. Psychiatric this patient is able to make decisions and demonstrates good insight into disease process. Alert and Oriented x 3. pleasant and cooperative. Notes Upon inspection today patient's wound bed actually showed signs of good granulation as best I can tell over the video conference. Subsequently there does not appear to be any signs of infection that I can see and the periwound seems to be doing quite well as well. Overall very poor with her progress. Pleased with her progress. Electronic Signature(s) Signed: 11/28/2018 5:14:13 PM By: Lenda Kelp PA-C Entered By: Lenda Kelp on 11/28/2018 17:14:13 Jessica [name] ([id number]) -------------------------------------------------------------------------------- Physician Orders Details [name]. Date of Service: 11/28/2018 4:00 PM Medical Record Number: [id number] Patient Account Number: [id number] Date of Birth/Sex: [birth year] (73 y.o. F) Treating RN: Arnette Norris Primary Care Provider: Annita Brod Other Clinician: Referring Provider: Annita Brod Treating Provider/Extender: Linwood Dibbles, HOYT Weeks in Treatment: 37 Verbal / Phone Orders: No Diagnosis Coding ICD-10 Coding Code Description L89.154 Pressure ulcer of sacral region, stage 4 M48.00 Spinal stenosis, site unspecified R54 Age-related physical debility G89.4 Chronic pain syndrome E78.2 Mixed hyperlipidemia L89.93 Pressure ulcer of unspecified site, stage 3 Wound Cleansing Wound #1 Medial Sacrum o Other: - please cleanse sacral wound with dakins moistened gauze - do not spray dakins on wound Anesthetic (add to Medication List) Wound #1 Medial Sacrum o Topical Lidocaine 4% cream applied to wound bed prior to debridement (In  Clinic Only). Skin Barriers/Peri-Wound Care Wound #1 Medial Sacrum o Skin Prep Primary Wound Dressing Wound #1 Medial Sacrum o Pack wound with: - Dakin's soaked gauze Secondary Dressing Wound #1 Medial Sacrum o ABD pad - secure with tape Dressing Change Frequency Wound #1 Medial Sacrum o Change dressing twice daily. Follow-up Appointments Wound #1 Medial Sacrum o Return Appointment in 2 weeks. Off-Loading Wound #1 Medial Sacrum o Turn and reposition every 2 hours [place], Daisja W. ([id number]) Additional Orders / Instructions Wound #1 Medial Sacrum o Increase protein intake. Home Health Wound #1 Medial Sacrum o Continue Home Health Visits - [facility] o Home Health Nurse may visit PRN to address patientos wound care needs. o FACE TO FACE ENCOUNTER: MEDICARE and MEDICAID PATIENTS: I certify that this patient is under my care and that I had a face-to-face encounter that meets the physician face-to-face encounter requirements with this patient on this date. The encounter with the patient was in whole or in part for the following MEDICAL CONDITION: (primary reason for Home Healthcare) MEDICAL NECESSITY: I certify, that based on my findings, NURSING services are a medically necessary home health service. HOME BOUND STATUS: I certify that my clinical findings support that this patient is homebound (i.e., Due to illness or injury, pt requires aid of supportive devices such as crutches, cane, wheelchairs, walkers, the use of special transportation or the assistance of another person to leave their place of residence.  There is a normal inability to leave the home and doing so requires considerable and taxing effort. Other absences are for medical reasons / religious services and are infrequent or of short duration when for other reasons). o If current dressing causes regression in wound condition, may D/C ordered dressing product/s and apply Normal Saline Moist  Dressing daily until next Wound Healing Center / Other MD appointment. Notify Wound Healing Center of regression in wound condition at [phone]. o Please direct any NON-WOUND related issues/requests for orders to patient's Primary Care Physician Electronic Signature(s) Signed: 11/28/2018 5:18:58 PM By: Lenda Kelp PA-C Previous Signature: 11/28/2018 4:29:58 PM Version By: Lenda Kelp PA-C Entered By: Lenda Kelp on 11/28/2018 16:33:28 Jessica [name] ([id number]) -------------------------------------------------------------------------------- Problem List Details [name]. Date of Service: 11/28/2018 4:00 PM Medical Record Number: [id number] Patient Account Number: [id number] Date of Birth/Sex: Jul 08, 1945 (73 y.o. F) Treating RN: Arnette Norris Primary Care Provider: Annita Brod Other Clinician: Referring Provider: Annita Brod Treating Provider/Extender: Linwood Dibbles, HOYT Weeks in Treatment: 7 Active Problems ICD-10 Evaluated Encounter Code Description Active Date Today Diagnosis L89.154 Pressure ulcer of sacral region, stage 4 05/27/2017 No Yes M48.00 Spinal stenosis, site unspecified 05/27/2017 No Yes R54 Age-related physical debility 05/27/2017 No Yes G89.4 Chronic pain syndrome 05/27/2017 No Yes E78.2 Mixed hyperlipidemia 05/27/2017 No Yes L89.93 Pressure ulcer of unspecified site, stage 3 05/27/2017 No Yes Inactive Problems Resolved Problems Electronic Signature(s) Signed: 11/28/2018 4:27:11 PM By: Lenda Kelp PA-C Entered By: Lenda Kelp on 11/28/2018 16:27:11 Jessica [name] ([id number]) -------------------------------------------------------------------------------- Progress Note Details [name]. Date of Service: 11/28/2018 4:00 PM Medical Record Number: [id number] Patient Account Number: [id number] Date of Birth/Sex: 01-Apr-1946 (73 y.o. F) Treating RN: Arnette Norris Primary Care Provider:  Annita Brod Other Clinician: Referring Provider: Annita Brod Treating Provider/Extender: Linwood Dibbles, HOYT Weeks in Treatment: 10 Subjective Chief Complaint Information obtained from Patient she is here for evaluation of a sacral ulcer History of Present Illness (HPI) 05/27/17-she is here in initial evaluation for a left-sided sacral stage IV pressure ulcer and bilateral lower extremity, lateral aspect, unstageable pressure ulcers. She is accompanied by her husband and her son, who are her primary caregivers. She is bedbound secondary to spinal stenosis. According to her son and husband she was hospitalized from 10/5-11/1 with healthcare associated pneumonia and altered mental status. During her hospitalization she was intubated, extubated on 10/27. She was discharged with Foley catheter and follow up with urology. According to her son and spouse she developed the sacral ulcer during hospitalization. Home health has been applying Santyl daily. She does have a low air loss mattress and is repositioned every 2-3 hours per her family's report. According to the son and spouse she had an appointment with urology on 12/18 and during that appointment developed discoloration to her bilateral lower extremities which ultimately developed into unstageable pressure ulcers to the lateral aspects of her bilateral lower extremities. There has been no topical treatment applied to these. She continues to have home health. There is no concerns expressed regarding dietary intake, stating she eats 3 meals a day, eating was provided; she is supplemented with boost with protein. 06/03/17-she is here in follow-up evaluation for sacral and bilateral lower extremity ulcers. Plain film x-ray done today reveals no distraction to the sacrum or coccyx, no visible abnormalities. Home health has ordered the negative pressure wound system but it has not been initiated. We will continue with Hydrofera Blue until initiation  of negative pressure wound system and continue with Santyl to bilateral lower extremity ulcers. Follow-up next week 06/10/17-she is here in follow-up evaluation for sacral and bilateral lower extremity ulcers. The wound VAC will be available tomorrow per home health. We will initiate wound VAC therapy to the sacral ulcer 3 times weekly (Thursday, Saturday/Sunday, Tuesday). We will continue with Santyl to the lower extremity ulcers. The patient's son is checking into home health therapy over the weekend for [facility] changes, with the understanding that if VAC changes cannot be performed over the weekend he will need to change his mother's appointments to Monday, Wednesday or Friday. The sacral ulcer clinically does not appear infected but there has been a change in the amount of drainage acutely, there is no significant amount of devitalized tissue, there is no malodor. Wound culture was obtained to evaluate for occult infection we will hold off on antibiotic therapy until sensitivities are resulted. 06/18/17 on evaluation today patient appears to be doing fairly well in my opinion although this is the first time I have seen this patient she has been previously evaluated by Tacey Ruiz here in our office. She is going to be switching to Fridays to see me due to the Wound VAC schedule being changed on Monday, Wednesday, and Friday. Subsequently she seems to be doing fairly well with the Wound VAC. Her son who was present during evaluation today states that he somewhat stresses of the Wound VAC in making sure that it was functioning appropriately. With that being said everything seems to be working well he knows what settings on the [facility] itself to look at and ensure that it is functioning properly. Overall the wound appears to be nice and clean there is no need for debridement today. She has no discomfort in her bilateral lower extremity ulcers also appear to be improving based on measurements and what this honest  tell me about the overall appearance. 07/02/17 on evaluation today I noted in patients wound bed that was actually an odor that had not previously been noted. Subsequently there was also a small area of bone that was not previously noted during my last evaluation. This did appear to be necrotic and was being somewhat forced out by the body around the region of granulation. She does not have any pain which is good news. Nonetheless the overall appearance of the ulcer is making me concerned for the patient having developed osteomyelitis. Currently she is not been on any antibiotics and the Wound VAC has been doing fairly well in general. With that being said I do not think we need to continue the Wound VAC if she potentially has a bone infection. At least not until it is properly addressed with antibiotics. KORAH, HUFSTEDLER ([id number]) 07/09/17 on evaluation today patient appears to actually be doing very well in regard to her sacral ulcer compared to last week. There is actually no exposed bone at this point. 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 With that being said still I do believe she needs to have a referral to infectious disease due to the osteomyelitis I am gonna recommend that she continue with the doxycycline based on the culture results which showed Eikenella Corrodens as the organism identified that tetracycline should work for. She does not seem to be having any discomfort whatsoever at this point. 07/16/17 on evaluation today  patient actually appears to be doing rather well in regard to her sacral wound. I think that the original wound site actually appears much better than previously noted. With that being said she does have a new superficial injury on the right sacral region which appears to be due to according to the sign  transport that occurred for her MRI unfortunately. Fortunately this is not too deep and I do think it can be managed but it was a new area that was not previously noted. Otherwise she has been tolerating the Dakin s soaked gauze packing without complication. 07/30/17 on evaluation today patient presents for reevaluation concerning her sacral wound. She has been tolerating the dressing changes without complication. With that being said her wound is doing so well at this point that I think she may be at the point where we could reinitiate the Wound VAC currently and hopefully see good results from this. I'm very pleased with how [facility] has [facility]. 08/13/17 evaluation today patient appears to be doing excellent in regard to her lower extremity ulcer this seems to be cleaning up very nicely. In regard to the sacral ulcer the area of trauma on the right lateral portion of the wound actually appears to be much better than previously noted during the last evaluation. The word has definitely filled in and the Wound VAC appears to be helpful I do believe. Overall I'm very pleased with how things seem to be progressing at this time. Patient likewise is also happy that things are doing well. 08/27/17 on evaluation today patient presents for follow-up concerning her ongoing issues with her sacral ulcer and lower extremity ulcer. Fortunately she has been tolerating the dressing changes well complication the Wound VAC in general seems to have done very well up to this point. She does not have any evidence of infection which is good news. She does have some dark discoloration in central portion of the wound which is troublesome for the possibility of pressure injury to the site it sounds like her prior air mattress was not functioning properly her son has just bought a new one for her this is doing much better. Otherwise things seem to be progressing nicely. 09/10/17 on evaluation  today patient presents for follow-up concerning her sacral wound and lower extremity ulcer. She has been tolerating the switch of her dressing changes to the Dakin s soaked gauze dressing very well in regard to the sacrum. The left lateral lower extremity ulcer appears to have a new area open just distal to the one that we have been treating with Santyl and this is new since her last visit. There does not appear to be any evidence of infection which is good news. With that being said there's a lot of maceration here at the site and I feel this is likely due to the switch and the dressings it appears that due to the sticking of the dressings it will switch to utilizing a telfa pad over the Santyl. I think this caused a lot of drainage to be collected and situated in the region just under the dressing which has led to this causing which duration of breakdown. Overall there does not appear to be any significant pain which is good news. Of note I did actually have a conversation with the radiologist who is an interventional radiologist with [facility]. 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 With that being said there does not appear to be any rush to get this done according to the interventional radiologist whom I spoke with. Therefore after discussion it was determined that there gonna wait about six months prior to considering the procedure to get time for hopefully the sacral wound to heal in the interim to at least some degree. 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 According to nursing staff these are all improved, the negative pressure wound therapy system was put on hold last week. We will resume negative pressure wound therapy today, continue with Santyl to the lower extremity and she will follow-up next week. 09/24/17 on evaluation today unfortunately the  patient's wound appears to be doing significantly worse compared to last week's evaluation and even my valuation the week before. She actually has bone noted on the left wound margin in the region of undermining unfortunately. This was not noted during the last evaluation. I am concerned about the fact that this seems to be worse not better since our last visit with her. My biggest concern is that she's likely developing a worsening osteomyelitis. This was discussed with patient and her son today during the office visit. 05/03/17 on evaluation today patient actually appears to be doing rather well in regard to her sacral ulcer compared to last week's evaluation. She has been tolerating the dressing changes without complication. Fortunately even though we're not utilizing the Wound VAC she seems to be making some strides in seeing the wound area overall improved quite dramatically in my pinion in just one weeks time. She also seems to be staying off of this to the point that I do not see any evidence of new injury which is excellent news. Whatever she's been doing in that regard over the past week I would want her to continue. KHAMIYA, VARIN ([id number]) did also review the results of her bone culture which revealed group B strep as the responsible organism. Again this is what's causing her osteomyelitis she does have infectious disease appointment scheduled for 10/11/17 10/08/1898 valuation today patient appears to be doing better for the most part in regard to the sacral wound. Overall she is showing signs of having granulation of the bone which is good news. There is an area towards the left of the wound where she does seem to be showing some signs of opening this it would be due to additional pressure to the area unfortunately. There does not appear to be any evidence of infection spreading which is good news that do not see any evidence of significant purulent discharge which is also good  news. 10/26/17 on evaluation today patient sacral ulcer actually appears to be very healthy and doing well in regard to the overall appearance of the wound. With that being said she does seem to be having some issues at this point in time with some shear/friction injury of the sacrum from where she was transported to [facility] for her infectious disease appointment. She has been placed on IV antibiotic therapy I will have to go back and find that note for review as I did not have access to that today. Nonetheless I will add the next visit be sure to document the exact antibiotics that she is taking at this point. Nonetheless I do believe the wound is doing better there's no bone exposure nothing that requires debridement in regard to the sacral wound. Likewise her left lateral lower extremity ulcer also  appears to be doing significantly better. In general I feel like things are showing signs of improvement all around. 11/12/17 on evaluation today patient appears to be doing rather well in regard to her sacral wound. In general I do see signs of granulation at this point. Fortunately there does not appear to be any evidence of infection which is good news as well. In general overall happy with the appearance. With that being said I'll be see I would like for this to be progressing more rapidly as with the patient and her son but nonetheless at least things do not appear to be doing worse. Her lower extremity ulcer is doing significantly better. 11/26/17 on evaluation today patient appears to be doing a little better in regard to the sacral wound especially in the peripheral of the wound bed. She actually did have an abrasion on the right gluteal region that's completely resolved to this point. Her lower extremity also appears to be for the most part completely resolved. Overall the sacrum itself seems to be the one thing that is still open and given her trouble. No fevers, chills, nausea, or vomiting  noted at this time. She actually has an appointment next week with infectious disease for recheck to see how things are doing. She maybe switch to oral medications at that point. 12/09/17 on evaluation today patient actually appears to be doing much better in regard to her sacral wound. I do feel like she has made good progress at this time as far as healing is concerned. In fact the wound looks better to me today than it has in quite some time. She is on oral antibiotic therapy and no longer on the IV antibiotics. She did see infectious disease last week. 12/24/17 on evaluation today patient's wounds actually appear to be doing decently well in regard to the sacral wound in particular. When it comes to her lower extremity ulcers both appear to be completely healed at this point in the one area where she had lived the rash has completely resolved at this time. Overall very pleased with the progress that has been made. Nonetheless the patient seems to be doing well in general. She still does have quite a bit of healing to do in regard to the sacral wound but I feel like she is making progress. 01/07/18 on evaluation today patient actually appears to be doing excellent today regard to her sacral wound. The granular quality in the base of the wound is excellent and shown signs of good improvement there's no evidence of contusion nor deep tissue injury and the periwound actually appears to be doing well. In general I'm very pleased with the overall appearance. 02/04/18 on evaluation today patient's wound actually appears to be doing decently well as far as the granulation is concerned. She has been tolerating the dressing changes without complication. Right now we are still using the Wound VAC. Nonetheless overall there apparently is some cater to the wound and this is concerning simply for the fact that she could be developing a soft tissue infection again. She's had this happen in the past and at that  time responded very well to the antibiotics. Nonetheless I don't think I would likely put her on anything prophylactically but rather wait for the results of the culture to return. 02/18/18 on evaluation today patient actually appears to be doing fairly well. She has been tolerating the dressing changes. And the Wound VAC seems to be doing well. Overall I'm very pleased with how  things have improved over the past couple weeks since I last saw her. Fortunately there does not appear to be any evidence of overt infection at this time which is good news. She has been taking the antibiotics without complication. Specifically that is the Bactrim DS which was prescribed on 02/08/18. 03/18/18 on evaluation today patient's wound actually does have quite a bit of odor noted compared to previous. With that TALAYSIA, PINHEIRO. ([id number]) being said the wound itself overall does not appear to be too significant or worse. There is no event noted in the wound bed which is good news. With that being said the patient has been worried as the home health nurse stated that she thought she saw bone in the base of the wound. Nonetheless there's a little bit of bruising along the left lateral border of the wound bed which again does not appear to be terrible we have noticed this before but other than this the majority the wound bed appears to be doing excellent in my pinion. I am wondering if there may be some bacterial colonization. This could be leading to some of the odor that we are noting with the Wound VAC. 04/01/18 on evaluation today patient appears to actually be doing well in regard to the overall appearance of the wound itself. She also does not have any odor at this point in regard to the wound surface and I do believe the Bacons is extremely helpful in this regard. With that being said she does have a little bit of deep tissue injury on the medial aspect of the wound. Again last time it was more lateral. The  lateral seems to have improved quite well. Nonetheless she also notes by way of her son that this has been very macerated in the past several weeks since I last saw her. Though things overall seem to be doing better I still think that the Wound VAC may help with managing her fluid better I'm wondering if we can attempt a gauze Wound VAC my hope is that this will be able to manage the moisture control as well as continuing with managing the odor of the wound and bacterial load since the Dakin s has done excellent in helping in that regard. 04/15/17 on evaluation today patient actually appears to be doing much better in regard to the overall appearance of her sacral wound. I do believe that the gauze Vac has been of benefit for her. Overall she is tolerating this without complication which is also good news. There is no sign of injury to the sacral region at this point. 04/29/18 on evaluation today patient appears to be doing well in regard to her sacral ulcer. She sure signs of good granulation and the Gauze VAC is doing very well for her. Overall I'm pleased in this regard. The biggest issue is that the time from Friday till Monday seems to be quite long for her as far as the amount of time to keep the Wound VAC sealed. For that reason her son wonders if he can take this off on Sunday morning in order to give her day of just wet to dry dressing's with the Dakin's solution I think this would be okay. 05/13/18 on evaluation today patient appears to be doing okay in regard to her sacral ulcer. There does not appear to be any evidence of overt infection at this time which is good news. In general the drainage and moisture appears to be significantly improved this was after the  pressure on the Wound VAC was increased after they contacted our office. With that being said a lot of the dark tissue around the edge of the wound has improved in the maceration is significantly better this seems to be managing much  better. Fortunately there is no signs of infection at this time. Overall the patient is doing quite well. The wound measurements are not significantly smaller today although they were over the past two visits we will see how things go. 05/27/18 on evaluation today patient's sacral wound actually appears to be doing fairly well at this point. She does have a deep tissue injury yet again on the medial aspect of the wound which is something that we seem to be seeing often. There is some question as to whether or not this is actually being caused by the Wound VAC bunching up and then her somewhat laying on it even though they are attempting to offload this as best as possible. Patient son states that overall they do the best they can trying offload her and I definitely believe him. I may want to see about putting the Wound VAC on hold for a couple weeks and see if this happens just with standard dressing changes. 06/10/18 on evaluation today patient actually appears to be doing better in regard to her sacral wound. Overall there's no signs of infection, no odor, and no significant skin breakdown around the edge of the wound I feel like this is done much better 50 Dakin s soaked gauze dressing as opposed to legalizing anything such as the Wound VAC. I believe that we discontinue the Wound VAC as of today. 06/24/18 on evaluation today patient actually appears to be doing well in regard to her sacral ulcer. There's no signs of infection, no odor, and in my pinion no concerns at all. I feel like she is making good progress as far as I'm concerned. 07/12/18 on evaluation today patient appears to be doing very well in regard to her sacral ulcer. She has been tolerating the dressing changes without complication. Fortunately there does not appear to be any evidence of active infection at this time she is doing excellent as far as I'm concerned at this point. 08/11/18 on evaluation today patient's sacral wound  actually appears to be doing quite well at this point. The main issue I see is that she continues to have some maceration and skin breakdown due to moisture over the right periwound location unfortunately. We've investigated the possibility of using zinc oxide before but have not utilized it simply due to the fact that she continues to state. Her son anyways that she is allergic to this. They have not tried it recently and may be completely unrelated to the reaction she had before. Nonetheless this may be something you want to attempt will discuss that in the plan. 10/04/18 on evaluation today patient actually appears to be doing quite well in regard to her sacral wound. It's been roughly 2 months since I last seen her in that time her wound has definitely made progress. This is very slow but again nonetheless EILIANA, DRONE. ([id number]) does seem to be trending in the correct direction. Overall very pleased in this regard. No fevers, chills, nausea, or vomiting noted at this time. 11/07/2018 on evaluation today patient appears to be doing decently well with regard to her sacral wound based on what I am seeing at this point. She is seen by way of a telehealth visit due to the COVID-19 national  emergency and to be honest the patient is somewhat reluctant to come out to be seen for visits which I completely understand. They feel much safer keeping her home as much as possible due to her adequate condition in general. The only issue that she has had recently she did have some wheezing and subsequently chest congestion she was just placed on albuterol along with prednisone at this point. She seems to be doing much better even after 24 hours of the prednisone according to her [name]. Overall they are pleased with how things seem to be progressing. 11/28/2018 upon evaluation today patient is seen by way of telehealth visit since she is still somewhat reluctant to come out due to the COVID-19  pandemic. Subsequently she seems to be doing well and infected I could not obtain actual measurements for the wound the overall appearance of the wound is dramatically improved. She does not appear to have nearly as much space at the 12:00 location and in fact her [name] who is taking care of her mainly states that he is barely able to get anything up into that region is filled and so nicely. Overall I feel like she is making great progress. Patient History Information obtained from Patient. Allergies zinc, Ambien Family History No family history of Cancer, Diabetes, Heart Disease. Social History Never smoker, Marital Status - Married. Review of Systems (ROS) Constitutional Symptoms (General Health) Denies complaints or symptoms of Fatigue, Fever, Chills, Marked Weight Change. Respiratory Denies complaints or symptoms of Chronic or frequent coughs, Shortness of Breath. Cardiovascular Denies complaints or symptoms of Chest pain, LE edema. Psychiatric Denies complaints or symptoms of Anxiety, Claustrophobia. Objective Constitutional Obese and well-hydrated in no acute distress. Respiratory normal breathing without difficulty. Psychiatric this patient is able to make decisions and demonstrates good insight into disease process. Alert and Oriented x 3. pleasant RAYE, WIENS. ([id number]) and cooperative. General Notes: Upon inspection today patient's wound bed actually showed signs of good granulation as best I can tell over the video conference. Subsequently there does not appear to be any signs of infection that I can see and the periwound seems to be doing quite well as well. Overall very poor with her progress. Pleased with her progress. Assessment Active Problems ICD-10 Pressure ulcer of sacral region, stage 4 Spinal stenosis, site unspecified Age-related physical debility Chronic pain syndrome Mixed hyperlipidemia Pressure ulcer of unspecified site, stage  3 Plan Wound Cleansing: Wound #1 Medial Sacrum: Other: - please cleanse sacral wound with dakins moistened gauze - do not spray dakins on wound Anesthetic (add to Medication List): Wound #1 Medial Sacrum: Topical Lidocaine 4% cream applied to wound bed prior to debridement (In Clinic Only). Skin Barriers/Peri-Wound Care: Wound #1 Medial Sacrum: Skin Prep Primary Wound Dressing: Wound #1 Medial Sacrum: Pack wound with: - Dakin's soaked gauze Secondary Dressing: Wound #1 Medial Sacrum: ABD pad - secure with tape Dressing Change Frequency: Wound #1 Medial Sacrum: Change dressing twice daily. Follow-up Appointments: Wound #1 Medial Sacrum: Return Appointment in 2 weeks. Off-Loading: Wound #1 Medial Sacrum: Turn and reposition every 2 hours Additional Orders / Instructions: Wound #1 Medial Sacrum: Increase protein intake. Home Health: Wound #1 Medial Sacrum: MAIYAH, GOYNE ([id number]) Continue Home Health Visits - [facility] Nurse may visit PRN to address patient s wound care needs. FACE TO FACE ENCOUNTER: MEDICARE and MEDICAID PATIENTS: I certify that this patient is under my care and that I had a face-to-face encounter that meets the physician face-to-face encounter  requirements with this patient on this date. The encounter with the patient was in whole or in part for the following MEDICAL CONDITION: (primary reason for Home Healthcare) MEDICAL NECESSITY: I certify, that based on my findings, NURSING services are a medically necessary home health service. HOME BOUND STATUS: I certify that my clinical findings support that this patient is homebound (i.e., Due to illness or injury, pt requires aid of supportive devices such as crutches, cane, wheelchairs, walkers, the use of special transportation or the assistance of another person to leave their place of residence. There is a normal inability to leave the home and doing so requires considerable and taxing effort.  Other absences are for medical reasons / religious services and are infrequent or of short duration when for other reasons). If current dressing causes regression in wound condition, may D/C ordered dressing product/s and apply Normal Saline Moist Dressing daily until next Wound Healing Center / Other MD appointment. Notify Wound Healing Center of regression in wound condition at [phone]. Please direct any NON-WOUND related issues/requests for orders to patient's Primary Care Physician 1. I would recommend that we continue with the Dakin's soaked gauze packing into the sacral wound since this seems to be beneficial. We will continue to use Desitin around the edge of the wound to help as a barrier to any moisture breakdown. 2. I suggest that she continue to offload appropriately as well her son has been doing this for her on a regular basis and it does show I see no signs of pressure injury today. 3. I recommend she continue to eat well I do believe her protein intake is good to be of utmost importance as well. We will see patient back for reevaluation in 2 weeks here in the clinic. If anything worsens or changes patient will contact our office for additional recommendations. I will plan at that time to see her for another telehealth visit they would like to do at least 1 or 2 more prior to even considering coming into the clinic just to see how things pan out I understand her concern and I am okay with that. Electronic Signature(s) Signed: 11/28/2018 5:15:09 PM By: Lenda Kelp PA-C Entered By: Lenda Kelp on 11/28/2018 17:15:09 Jessica [name] ([id number]) -------------------------------------------------------------------------------- ROS/PFSH Details [name]. Date of Service: 11/28/2018 4:00 PM Medical Record Number: [id number] Patient Account Number: [id number] Date of Birth/Sex: 09-Jun-1945 (73 y.o. F) Treating RN: Arnette Norris Primary Care  Provider: Annita Brod Other Clinician: Referring Provider: Annita Brod Treating Provider/Extender: STONE III, HOYT Weeks in Treatment: 25 Information Obtained From Patient Constitutional Symptoms (General Health) Complaints and Symptoms: Negative for: Fatigue; Fever; Chills; Marked Weight Change Respiratory Complaints and Symptoms: Negative for: Chronic or frequent coughs; Shortness of Breath Cardiovascular Complaints and Symptoms: Negative for: Chest pain; LE edema Psychiatric Complaints and Symptoms: Negative for: Anxiety; Claustrophobia Immunizations Pneumococcal Vaccine: Received Pneumococcal Vaccination: No Implantable Devices No devices added Family and Social History Cancer: No; Diabetes: No; Heart Disease: No; Never smoker; Marital Status - Married Physician Affirmation I have reviewed and agree with the above information. Electronic Signature(s) Signed: 11/28/2018 4:31:36 PM By: Arnette Norris Signed: 11/28/2018 5:18:58 PM By: Lenda Kelp PA-C Entered By: Lenda Kelp on 11/28/2018 16:26:47 Jessica [name] ([id number]) -------------------------------------------------------------------------------- SuperBill Details [name]. Date of Service: 11/28/2018 Medical Record Number: [id number] Patient Account Number: [id number] Date of Birth/Sex: 1945/04/23 (73 y.o. F) Treating RN: Arnette Norris Primary Care Provider: Annita Brod Other Clinician:  Referring Provider: Annita BrodASENSO, PHILIP Treating Provider/Extender: STONE III, HOYT Weeks in Treatment: 78 Diagnosis Coding ICD-10 Codes Code Description L89.154 Pressure ulcer of sacral region, stage 4 M48.00 Spinal stenosis, site unspecified R54 Age-related physical debility G89.4 Chronic pain syndrome E78.2 Mixed hyperlipidemia L89.93 Pressure ulcer of unspecified site, stage 3 Physician Procedures CPT4 Code: [id number] Description: 99213 - WC PHYS LEVEL 3 - EST PT ICD-10 Diagnosis  Description L89.154 Pressure ulcer of sacral region, stage 4 M48.00 Spinal stenosis, site unspecified R54 Age-related physical debility G89.4 Chronic pain syndrome Modifier: Quantity: 1 Electronic Signature(s) Signed: 11/28/2018 5:16:11 PM By: Lenda KelpStone III, Hoyt PA-C Entered By: Lenda KelpStone III, Hoyt on 11/28/2018 17:16:11

## 2018-11-28 NOTE — Progress Notes (Signed)
EMBERLIN, VERNER ([id number]) Visit Report for 11/28/2018 Allergy List Details [name], Jessica [name]. Date of Service: 11/28/2018 4:00 PM Medical Record Number: [id number] Patient Account Number: [id number] Date of Birth/Sex: 1946/03/09 (73 y.o. F) Treating RN: Harold Barban Primary Care Wafa Martes: Clovia Cuff Other Clinician: Referring Deziah Renwick: Clovia Cuff Treating Deziree Mokry/Extender: STONE III, HOYT Weeks in Treatment: 78 Allergies Active Allergies zinc Ambien Allergy Notes Electronic Signature(s) Signed: 11/28/2018 4:26:59 PM By: Worthy Keeler PA-C Previous Signature: 11/28/2018 4:25:30 PM Version By: Worthy Keeler PA-C Entered By: Worthy Keeler on 11/28/2018 16:26:58

## 2018-12-12 ENCOUNTER — Encounter: Payer: Medicare Other | Admitting: Physician Assistant

## 2018-12-13 NOTE — Progress Notes (Signed)
[name], [name] ([id number]) Visit Report for 12/12/2018 Allergy List Details [name], Jessica [name]. Date of Service: 12/12/2018 4:00 PM Medical Record Number: [id number] Patient Account Number: [id number] Date of Birth/Sex: [birth year] (73 y.o. F) Treating RN: Harold Barban Primary Care Dylan Ruotolo: Clovia Cuff Other Clinician: Referring Shantele Reller: Clovia Cuff Treating Juline Sanderford/Extender: STONE III, HOYT Weeks in Treatment: 80 Allergies Active Allergies zinc Ambien Allergy Notes Electronic Signature(s) Signed: 12/12/2018 4:16:20 PM By: Worthy Keeler PA-C Entered By: Worthy Keeler on 12/12/2018 16:16:19

## 2018-12-15 NOTE — Progress Notes (Signed)
Jessica [name], Jessica [name] ([id number]) Visit Report for 12/12/2018 Chief Complaint Document Details [name]. Date of Jessica [name]: 12/12/2018 4:00 PM Medical Record Number: [id number] Patient Account Number: [id number] Date of Birth/Sex: [birth year] (73 y.o. F) Treating RN: Jessica [name] Primary Care Provider: Annita Brod Other Clinician: Referring Provider: Annita Brod Treating Provider/Extender: Linwood Dibbles, Eliya Bubar Weeks in Treatment: 80 Information Obtained from: Patient Chief Complaint she is here for evaluation of a sacral ulcer Electronic Signature(s) Signed: 12/12/2018 4:16:48 PM By: Lenda Kelp PA-C Entered By: Lenda Kelp on 12/12/2018 16:16:48 Jessica [name] ([id number]) -------------------------------------------------------------------------------- HPI Details [name]. Date of Jessica [name]: 12/12/2018 4:00 PM Medical Record Number: [id number] Patient Account Number: [id number] Date of Birth/Sex: [birth year] (73 y.o. F) Treating RN: Jessica [name] Primary Care Provider: Annita Brod Other Clinician: Referring Provider: Annita Brod Treating Provider/Extender: Linwood Dibbles, Jaclin Finks Weeks in Treatment: 80 History of Present Illness HPI Description: 05/27/17-she is here in initial evaluation for a left-sided sacral stage IV pressure ulcer and bilateral lower extremity, lateral aspect, unstageable pressure ulcers. She is accompanied by her husband and her son, who are her primary caregivers. She is bedbound secondary to spinal stenosis. According to her son and husband she was hospitalized from 10/5-11/1 with healthcare associated pneumonia and altered mental status. During her hospitalization she was intubated, extubated on 10/27. She was discharged with Foley catheter and follow up with urology. According to her son and spouse she developed the sacral ulcer during hospitalization. Home health has been applying Santyl daily. She does  have a low air loss mattress and is repositioned every 2-3 hours per her family's report. According to the son and spouse she had an appointment with urology on 12/18 and during that appointment developed discoloration to her bilateral lower extremities which ultimately developed into unstageable pressure ulcers to the lateral aspects of her bilateral lower extremities. There has been no topical treatment applied to these. She continues to have home health. There is no concerns expressed regarding dietary intake, stating she eats 3 meals a day, eating was provided; she is supplemented with boost with protein. 06/03/17-she is here in follow-up evaluation for sacral and bilateral lower extremity ulcers. Plain film x-ray done today reveals no distraction to the sacrum or coccyx, no visible abnormalities. Home health has ordered the negative pressure wound system but it has not been initiated. We will continue with Hydrofera Blue until initiation of negative pressure wound system and continue with Santyl to bilateral lower extremity ulcers. Follow-up next week 06/10/17-she is here in follow-up evaluation for sacral and bilateral lower extremity ulcers. The wound VAC will be available tomorrow per home health. We will initiate wound VAC therapy to the sacral ulcer 3 times weekly (Thursday, Saturday/Sunday, Tuesday). We will continue with Santyl to the lower extremity ulcers. The patient's son is checking into home health therapy over the weekend for [facility] changes, with the understanding that if VAC changes cannot be performed over the weekend he will need to change his mother's appointments to Monday, Wednesday or Friday. The sacral ulcer clinically does not appear infected but there has been a change in the amount of drainage acutely, there is no significant amount of devitalized tissue, there is no malodor. Wound culture was obtained to evaluate for occult infection we will hold off on antibiotic therapy  until sensitivities are resulted. 06/18/17 on evaluation today patient appears to be doing fairly well in my opinion although this is the first time I have seen this patient she  has been previously evaluated by Denny Peon here in our office. She is going to be switching to Fridays to see me due to the Wound VAC schedule being changed on Monday, Wednesday, and Friday. Subsequently she seems to be doing fairly well with the Wound VAC. Her son who was present during evaluation today states that he somewhat stresses of the Wound VAC in making sure that it was functioning appropriately. With that being said everything seems to be working well he knows what settings on the [facility] itself to look at and ensure that it is functioning properly. Overall the wound appears to be nice and clean there is no need for debridement today. She has no discomfort in her bilateral lower extremity ulcers also appear to be improving based on measurements and what this honest tell me about the overall appearance. 07/02/17 on evaluation today I noted in patients wound bed that was actually an odor that had not previously been noted. Subsequently there was also a small area of bone that was not previously noted during my last evaluation. This did appear to be necrotic and was being somewhat forced out by the body around the region of granulation. She does not have any pain which is good news. Nonetheless the overall appearance of the ulcer is making me concerned for the patient having developed osteomyelitis. Currently she is not been on any antibiotics and the Wound VAC has been doing fairly well in general. With that being said I do not think we need to continue the Wound VAC if she potentially has a bone infection. At least not until it is properly addressed with antibiotics. 07/09/17 on evaluation today patient appears to actually be doing very well in regard to her sacral ulcer compared to last week. There is actually no exposed bone  at this point. 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 With that being said still I do believe she needs to have a referral to infectious disease due to the osteomyelitis I am gonna recommend that she continue with the doxycycline based on the culture results which showed Eikenella Corrodens as the Jessica [name], Jessica [name]. ([id number]) organism identified that tetracycline should work for. She does not seem to be having any discomfort whatsoever at this point. 07/16/17 on evaluation today patient actually appears to be doing rather well in regard to her sacral wound. I think that the original wound site actually appears much better than previously noted. With that being said she does have a new superficial injury on the right sacral region which appears to be due to according to the sign transport that occurred for her MRI unfortunately. Fortunately this is not too deep and I do think it can be managed but it was a new area that was not previously noted. Otherwise she has been tolerating the Dakinos soaked gauze packing without complication. 07/30/17 on evaluation today patient presents for reevaluation concerning her sacral wound. She has been tolerating the dressing changes without complication. With that being said her wound is doing so well at this point that I think she may be at the point where we could reinitiate the Wound VAC currently and hopefully see good results from this. I'm very pleased with how she has responded to the Dakinos soaked gauze packing. 08/13/17 evaluation today patient appears to be doing excellent in regard to her lower extremity  ulcer this seems to be cleaning up very nicely. In regard to the sacral ulcer the area of trauma on the right lateral portion of the wound actually appears to be much better than previously noted  during the last evaluation. The word has definitely filled in and the Wound VAC appears to be helpful I do believe. Overall I'm very pleased with how things seem to be progressing at this time. Patient likewise is also happy that things are doing well. 08/27/17 on evaluation today patient presents for follow-up concerning her ongoing issues with her sacral ulcer and lower extremity ulcer. Fortunately she has been tolerating the dressing changes well complication the Wound VAC in general seems to have done very well up to this point. She does not have any evidence of infection which is good news. She does have some dark discoloration in central portion of the wound which is troublesome for the possibility of pressure injury to the site it sounds like her prior air mattress was not functioning properly her son has just bought a new one for her this is doing much better. Otherwise things seem to be progressing nicely. 09/10/17 on evaluation today patient presents for follow-up concerning her sacral wound and lower extremity ulcer. She has been tolerating the switch of her dressing changes to the Dakinos soaked gauze dressing very well in regard to the sacrum. The left lateral lower extremity ulcer appears to have a new area open just distal to the one that we have been treating with Santyl and this is new since her last visit. There does not appear to be any evidence of infection which is good news. With that being said there's a lot of maceration here at the site and I feel this is likely due to the switch and the dressings it appears that due to the sticking of the dressings it will switch to utilizing a telfa pad over the Santyl. I think this caused a lot of drainage to be collected and situated in the region just under the dressing which has led to this causing which duration of breakdown. Overall there does not appear to be any significant pain which is good news. Of note I did actually have a  conversation with the radiologist who is an interventional radiologist with [facility]. 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 With that being said there does not appear to be any rush to get this done according to the interventional radiologist whom I spoke with. Therefore after discussion it was determined that there gonna wait about six months prior to considering the procedure to get time for hopefully the sacral wound to heal in the interim to at least some degree. 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 According to nursing staff these are all improved, the negative pressure wound therapy system was put on hold last week. We will resume negative pressure wound therapy today, continue with Santyl to the lower extremity and she will follow-up next week. 09/24/17 on evaluation today unfortunately the patient's wound appears to be doing significantly worse compared to last week's evaluation and even my valuation the week before. She actually has bone noted on the left wound margin in the region of undermining unfortunately. This was not noted during the last evaluation. I am concerned about the fact that this seems to be worse not better since our last visit with her.  My biggest concern is that she's likely developing a worsening osteomyelitis. This was discussed with patient and her son today during the office visit. 05/03/17 on evaluation today patient actually appears to be doing rather well in regard to her sacral ulcer compared to last week's evaluation. She has been tolerating the dressing changes without complication. Fortunately even though we're not utilizing the Wound VAC she seems to be making some strides in seeing the wound area overall improved quite dramatically in my pinion in just one weeks time. She also seems to be staying off of this to the point  that I do not see any evidence of new injury which is excellent news. Whatever she's been doing in that regard over the past week I would want her to continue. I did also review the results of her bone culture which revealed group B strep as the responsible organism. Again this is what's causing her osteomyelitis she does have infectious disease appointment scheduled for 10/11/17 10/08/1898 valuation today patient appears to be doing better for the most part in regard to the sacral wound. Overall she is showing signs of having granulation of the bone which is good news. There is an area towards the left of the wound where she does seem to be showing some signs of opening this it would be due to additional pressure to the area unfortunately. Jessica [name], Jessica [name]. ([id number]) There does not appear to be any evidence of infection spreading which is good news that do not see any evidence of significant purulent discharge which is also good news. 10/26/17 on evaluation today patient sacral ulcer actually appears to be very healthy and doing well in regard to the overall appearance of the wound. With that being said she does seem to be having some issues at this point in time with some shear/friction injury of the sacrum from where she was transported to [facility] for her infectious disease appointment. She has been placed on IV antibiotic therapy I will have to go back and find that note for review as I did not have access to that today. Nonetheless I will add the next visit be sure to document the exact antibiotics that she is taking at this point. Nonetheless I do believe the wound is doing better there's no bone exposure nothing that requires debridement in regard to the sacral wound. Likewise her left lateral lower extremity ulcer also appears to be doing significantly better. In general I feel like things are showing signs of improvement all around. 11/12/17 on evaluation today patient appears to be doing  rather well in regard to her sacral wound. In general I do see signs of granulation at this point. Fortunately there does not appear to be any evidence of infection which is good news as well. In general overall happy with the appearance. With that being said I'll be see I would like for this to be progressing more rapidly as with the patient and her son but nonetheless at least things do not appear to be doing worse. Her lower extremity ulcer is doing significantly better. 11/26/17 on evaluation today patient appears to be doing a little better in regard to the sacral wound especially in the peripheral of the wound bed. She actually did have an abrasion on the right gluteal region that's completely resolved to this point. Her lower extremity also appears to be for the most part completely resolved. Overall the sacrum itself seems to be the one thing that is still open and given her  trouble. No fevers, chills, nausea, or vomiting noted at this time. She actually has an appointment next week with infectious disease for recheck to see how things are doing. She maybe switch to oral medications at that point. 12/09/17 on evaluation today patient actually appears to be doing much better in regard to her sacral wound. I do feel like she has made good progress at this time as far as healing is concerned. In fact the wound looks better to me today than it has in quite some time. She is on oral antibiotic therapy and no longer on the IV antibiotics. She did see infectious disease last week. 12/24/17 on evaluation today patient's wounds actually appear to be doing decently well in regard to the sacral wound in particular. When it comes to her lower extremity ulcers both appear to be completely healed at this point in the one area where she had lived the rash has completely resolved at this time. Overall very pleased with the progress that has been made. Nonetheless the patient seems to be doing well in general.  She still does have quite a bit of healing to do in regard to the sacral wound but I feel like she is making progress. 01/07/18 on evaluation today patient actually appears to be doing excellent today regard to her sacral wound. The granular quality in the base of the wound is excellent and shown signs of good improvement there's no evidence of contusion nor deep tissue injury and the periwound actually appears to be doing well. In general I'm very pleased with the overall appearance. 02/04/18 on evaluation today patient's wound actually appears to be doing decently well as far as the granulation is concerned. She has been tolerating the dressing changes without complication. Right now we are still using the Wound VAC. Nonetheless overall there apparently is some cater to the wound and this is concerning simply for the fact that she could be developing a soft tissue infection again. She's had this happen in the past and at that time responded very well to the antibiotics. Nonetheless I don't think I would likely put her on anything prophylactically but rather wait for the results of the culture to return. 02/18/18 on evaluation today patient actually appears to be doing fairly well. She has been tolerating the dressing changes. And the Wound VAC seems to be doing well. Overall I'm very pleased with how things have improved over the past couple weeks since I last saw her. Fortunately there does not appear to be any evidence of overt infection at this time which is good news. She has been taking the antibiotics without complication. Specifically that is the Bactrim DS which was prescribed on 02/08/18. 03/18/18 on evaluation today patient's wound actually does have quite a bit of odor noted compared to previous. With that being said the wound itself overall does not appear to be too significant or worse. There is no event noted in the wound bed which is good news. With that being said the patient has  been worried as the home health nurse stated that she thought she saw bone in the base of the wound. Nonetheless there's a little bit of bruising along the left lateral border of the wound bed which again does not appear to be terrible we have noticed this before but other than this the majority the wound bed appears to be doing excellent in my pinion. I am wondering if there may be some bacterial colonization. This could be leading to some  of the odor that we are noting with the Wound VAC. Jessica [name], Jessica [name] ([id number]) 04/01/18 on evaluation today patient appears to actually be doing well in regard to the overall appearance of the wound itself. She also does not have any odor at this point in regard to the wound surface and I do believe the Bacons is extremely helpful in this regard. With that being said she does have a little bit of deep tissue injury on the medial aspect of the wound. Again last time it was more lateral. The lateral seems to have improved quite well. Nonetheless she also notes by way of her son that this has been very macerated in the past several weeks since I last saw her. Though things overall seem to be doing better I still think that the Wound VAC may help with managing her fluid better I'm wondering if we can attempt a gauze Wound VAC my hope is that this will be able to manage the moisture control as well as continuing with managing the odor of the wound and bacterial load since the Dakinos has done excellent in helping in that regard. 04/15/17 on evaluation today patient actually appears to be doing much better in regard to the overall appearance of her sacral wound. I do believe that the gauze Vac has been of benefit for her. Overall she is tolerating this without complication which is also good news. There is no sign of injury to the sacral region at this point. 04/29/18 on evaluation today patient appears to be doing well in regard to her sacral ulcer. She sure signs of  good granulation and the Gauze VAC is doing very well for her. Overall I'm pleased in this regard. The biggest issue is that the time from Friday till Monday seems to be quite long for her as far as the amount of time to keep the Wound VAC sealed. For that reason her son wonders if he can take this off on Sunday morning in order to give her day of just wet to dry dressing's with the Dakin's solution I think this would be okay. 05/13/18 on evaluation today patient appears to be doing okay in regard to her sacral ulcer. There does not appear to be any evidence of overt infection at this time which is good news. In general the drainage and moisture appears to be significantly improved this was after the pressure on the Wound VAC was increased after they contacted our office. With that being said a lot of the dark tissue around the edge of the wound has improved in the maceration is significantly better this seems to be managing much better. Fortunately there is no signs of infection at this time. Overall the patient is doing quite well. The wound measurements are not significantly smaller today although they were over the past two visits we will see how things go. 05/27/18 on evaluation today patient's sacral wound actually appears to be doing fairly well at this point. She does have a deep tissue injury yet again on the medial aspect of the wound which is something that we seem to be seeing often. There is some question as to whether or not this is actually being caused by the Wound VAC bunching up and then her somewhat laying on it even though they are attempting to offload this as best as possible. Patient son states that overall they do the best they can trying offload her and I definitely believe him. I may want to see about  putting the Wound VAC on hold for a couple weeks and see if this happens just with standard dressing changes. 06/10/18 on evaluation today patient actually appears to be doing  better in regard to her sacral wound. Overall there's no signs of infection, no odor, and no significant skin breakdown around the edge of the wound I feel like this is done much better 50 Dakinos soaked gauze dressing as opposed to legalizing anything such as the Wound VAC. I believe that we discontinue the Wound VAC as of today. 06/24/18 on evaluation today patient actually appears to be doing well in regard to her sacral ulcer. There's no signs of infection, no odor, and in my pinion no concerns at all. I feel like she is making good progress as far as I'm concerned. 07/12/18 on evaluation today patient appears to be doing very well in regard to her sacral ulcer. She has been tolerating the dressing changes without complication. Fortunately there does not appear to be any evidence of active infection at this time she is doing excellent as far as I'm concerned at this point. 08/11/18 on evaluation today patient's sacral wound actually appears to be doing quite well at this point. The main issue I see is that she continues to have some maceration and skin breakdown due to moisture over the right periwound location unfortunately. We've investigated the possibility of using zinc oxide before but have not utilized it simply due to the fact that she continues to state. Her son anyways that she is allergic to this. They have not tried it recently and may be completely unrelated to the reaction she had before. Nonetheless this may be something you want to attempt will discuss that in the plan. 10/04/18 on evaluation today patient actually appears to be doing quite well in regard to her sacral wound. It's been roughly 2 months since I last seen her in that time her wound has definitely made progress. This is very slow but again nonetheless does seem to be trending in the correct direction. Overall very pleased in this regard. No fevers, chills, nausea, or vomiting noted at this time. 11/07/2018 on evaluation  today patient appears to be doing decently well with regard to her sacral wound based on what I am seeing at this point. She is seen by way of a telehealth visit due to the COVID-19 national emergency and to be honest the patient is somewhat reluctant to come out to be seen for visits which I completely understand. They feel much safer keeping Jessica [name], [name] ([id number]) her home as much as possible due to her adequate condition in general. The only issue that she has had recently she did have some wheezing and subsequently chest congestion she was just placed on albuterol along with prednisone at this point. She seems to be doing much better even after 24 hours of the prednisone according to her son Jessica [name]. Overall they are pleased with how things seem to be progressing. 11/28/2018 upon evaluation today patient is seen by way of telehealth visit since she is still somewhat reluctant to come out due to the COVID-19 pandemic. Subsequently she seems to be doing well and infected I could not obtain actual measurements for the wound the overall appearance of the wound is dramatically improved. She does not appear to have nearly as much space at the 12:00 location and in fact her son Jessica [name] who is taking care of her mainly states that he is barely able to get anything up into  that region is filled and so nicely. Overall I feel like she is making great progress. 12/12/2018 patient is seen today via a telehealth visit again secondary to her concerns about coming out during the COVID-19 national pandemic. Nonetheless she appears to be doing quite well at this time which is good news. There is no signs of active infection at this time. Overall patient seems to be very pleased as is her son who takes very good care of her. Electronic Signature(s) Signed: 12/12/2018 5:30:46 PM By: Lenda Kelp PA-C Entered By: Lenda Kelp on 12/12/2018 17:30:46 Jessica [name]  ([id number]) -------------------------------------------------------------------------------- Physical Exam Details [name]. Date of Jessica [name]: 12/12/2018 4:00 PM Medical Record Number: [id number] Patient Account Number: [id number] Date of Birth/Sex: Jun 26, 1945 (73 y.o. F) Treating RN: Jessica [name] Primary Care Provider: Annita Brod Other Clinician: Referring Provider: Annita Brod Treating Provider/Extender: STONE III, Iosefa Weintraub Weeks in Treatment: 80 Constitutional Obese and well-hydrated in no acute distress. Respiratory normal breathing without difficulty. Psychiatric this patient is able to make decisions and demonstrates good insight into disease process. Alert and Oriented x 3. pleasant and cooperative. Notes Patient's wound bed again best I can tell on examination seems to be showing signs of improvement which is good news. There is no evidence of active infection at this time which is also good news. I see no evidence of pressure injury and I think they are doing a very good job at offloading and keeping pressure off of the area. Overall I am pleased with her progress. Electronic Signature(s) Signed: 12/12/2018 5:31:34 PM By: Lenda Kelp PA-C Entered By: Lenda Kelp on 12/12/2018 17:31:34 Jessica [name] ([id number]) -------------------------------------------------------------------------------- Physician Orders Details [name]. Date of Jessica [name]: 12/12/2018 4:00 PM Medical Record Number: [id number] Patient Account Number: [id number] Date of Birth/Sex: 02/20/1946 (73 y.o. F) Treating RN: Jessica [name] Primary Care Provider: Annita Brod Other Clinician: Referring Provider: Annita Brod Treating Provider/Extender: Linwood Dibbles, Walterine Amodei Weeks in Treatment: 19 Verbal / Phone Orders: No Diagnosis Coding ICD-10 Coding Code Description L89.154 Pressure ulcer of sacral region, stage 4 M48.00 Spinal stenosis, site  unspecified R54 Age-related physical debility G89.4 Chronic pain syndrome E78.2 Mixed hyperlipidemia L89.93 Pressure ulcer of unspecified site, stage 3 Wound Cleansing Wound #1 Medial Sacrum o Other: - please cleanse sacral wound with dakins moistened gauze - do not spray dakins on wound Anesthetic (add to Medication List) Wound #1 Medial Sacrum o Topical Lidocaine 4% cream applied to wound bed prior to debridement (In Clinic Only). Skin Barriers/Peri-Wound Care Wound #1 Medial Sacrum o Skin Prep Primary Wound Dressing Wound #1 Medial Sacrum o Pack wound with: - Dakin's soaked gauze Secondary Dressing Wound #1 Medial Sacrum o ABD pad - secure with tape Dressing Change Frequency Wound #1 Medial Sacrum o Change dressing twice daily. Follow-up Appointments Wound #1 Medial Sacrum o Return Appointment in 2 weeks. Off-Loading Wound #1 Medial Sacrum o Turn and reposition every 2 hours Sollars, Akshara W. ([id number]) Additional Orders / Instructions Wound #1 Medial Sacrum o Increase protein intake. Home Health Wound #1 Medial Sacrum o Continue Home Health Visits - [facility] o Home Health Nurse may visit PRN to address patientos wound care needs. o FACE TO FACE ENCOUNTER: MEDICARE and MEDICAID PATIENTS: I certify that this patient is under my care and that I had a face-to-face encounter that meets the physician face-to-face encounter requirements with this patient on this date. The encounter with the patient was in whole or in part for  the following MEDICAL CONDITION: (primary reason for Home Healthcare) MEDICAL NECESSITY: I certify, that based on my findings, NURSING services are a medically necessary home health Jessica [name]. HOME BOUND STATUS: I certify that my clinical findings support that this patient is homebound (i.e., Due to illness or injury, pt requires aid of supportive devices such as crutches, cane, wheelchairs, walkers, the use of special  transportation or the assistance of another person to leave their place of residence. There is a normal inability to leave the home and doing so requires considerable and taxing effort. Other absences are for medical reasons / religious services and are infrequent or of short duration when for other reasons). o If current dressing causes regression in wound condition, may D/C ordered dressing product/s and apply Normal Saline Moist Dressing daily until next Wound Healing Center / Other MD appointment. Notify Wound Healing Center of regression in wound condition at [phone]. o Please direct any NON-WOUND related issues/requests for orders to patient's Primary Care Physician Electronic Signature(s) Signed: 12/12/2018 4:23:10 PM By: Lenda Kelp PA-C Entered By: Lenda Kelp on 12/12/2018 16:23:10 Jessica [name] ([id number]) -------------------------------------------------------------------------------- Problem List Details [name]. Date of Jessica [name]: 12/12/2018 4:00 PM Medical Record Number: [id number] Patient Account Number: [id number] Date of Birth/Sex: 1945/07/16 (73 y.o. F) Treating RN: Jessica [name] Primary Care Provider: Annita Brod Other Clinician: Referring Provider: Annita Brod Treating Provider/Extender: Linwood Dibbles, Quana Chamberlain Weeks in Treatment: 80 Active Problems ICD-10 Evaluated Encounter Code Description Active Date Today Diagnosis L89.154 Pressure ulcer of sacral region, stage 4 05/27/2017 No Yes M48.00 Spinal stenosis, site unspecified 05/27/2017 No Yes R54 Age-related physical debility 05/27/2017 No Yes G89.4 Chronic pain syndrome 05/27/2017 No Yes E78.2 Mixed hyperlipidemia 05/27/2017 No Yes L89.93 Pressure ulcer of unspecified site, stage 3 05/27/2017 No Yes Inactive Problems Resolved Problems Electronic Signature(s) Signed: 12/12/2018 4:16:44 PM By: Lenda Kelp PA-C Entered By: Lenda Kelp on 12/12/2018 16:16:43 Jessica [name] ([id number]) -------------------------------------------------------------------------------- Progress Note Details [name]. Date of Jessica [name]: 12/12/2018 4:00 PM Medical Record Number: [id number] Patient Account Number: [id number] Date of Birth/Sex: February 15, 1946 (73 y.o. F) Treating RN: Jessica [name] Primary Care Provider: Annita Brod Other Clinician: Referring Provider: Annita Brod Treating Provider/Extender: Linwood Dibbles, Usher Hedberg Weeks in Treatment: 80 Subjective Chief Complaint Information obtained from Patient she is here for evaluation of a sacral ulcer History of Present Illness (HPI) 05/27/17-she is here in initial evaluation for a left-sided sacral stage IV pressure ulcer and bilateral lower extremity, lateral aspect, unstageable pressure ulcers. She is accompanied by her husband and her son, who are her primary caregivers. She is bedbound secondary to spinal stenosis. According to her son and husband she was hospitalized from 10/5-11/1 with healthcare associated pneumonia and altered mental status. During her hospitalization she was intubated, extubated on 10/27. She was discharged with Foley catheter and follow up with urology. According to her son and spouse she developed the sacral ulcer during hospitalization. Home health has been applying Santyl daily. She does have a low air loss mattress and is repositioned every 2-3 hours per her family's report. According to the son and spouse she had an appointment with urology on 12/18 and during that appointment developed discoloration to her bilateral lower extremities which ultimately developed into unstageable pressure ulcers to the lateral aspects of her bilateral lower extremities. There has been no topical treatment applied to these. She continues to have home health. There is no concerns expressed regarding dietary intake, stating she eats 3  meals a day, eating was provided; she is supplemented  with boost with protein. 06/03/17-she is here in follow-up evaluation for sacral and bilateral lower extremity ulcers. Plain film x-ray done today reveals no distraction to the sacrum or coccyx, no visible abnormalities. Home health has ordered the negative pressure wound system but it has not been initiated. We will continue with Hydrofera Blue until initiation of negative pressure wound system and continue with Santyl to bilateral lower extremity ulcers. Follow-up next week 06/10/17-she is here in follow-up evaluation for sacral and bilateral lower extremity ulcers. The wound VAC will be available tomorrow per home health. We will initiate wound VAC therapy to the sacral ulcer 3 times weekly (Thursday, Saturday/Sunday, Tuesday). We will continue with Santyl to the lower extremity ulcers. The patient's son is checking into [facility] health [facility], with the understanding that if VAC changes cannot be performed over the weekend he will need to change his mother's appointments to Monday, Wednesday or Friday. The sacral ulcer clinically does not appear infected but there has been a change in the amount of drainage acutely, there is no significant amount of devitalized tissue, there is no malodor. Wound culture was obtained to evaluate for occult infection we will hold off on antibiotic therapy until sensitivities are resulted. 06/18/17 on evaluation today patient appears to be doing fairly well in my opinion although this is the first time I have seen this patient she has been previously evaluated by Tacey RuizLeah here in our office. She is going to be switching to Fridays to see me due to the Wound VAC schedule being changed on Monday, Wednesday, and Friday. Subsequently she seems to be doing fairly well with the Wound VAC. Her son who was present during evaluation today states that he somewhat stresses of the Wound VAC in making sure that it was functioning appropriately. With that  being said everything seems to be working well he knows what settings on the [facility] itself to look at and ensure that it is functioning properly. Overall the wound appears to be nice and clean there is no need for debridement today. She has no discomfort in her bilateral lower extremity ulcers also appear to be improving based on measurements and what this honest tell me about the overall appearance. 07/02/17 on evaluation today I noted in patients wound bed that was actually an odor that had not previously been noted. Subsequently there was also a small area of bone that was not previously noted during my last evaluation. This did appear to be necrotic and was being somewhat forced out by the body around the region of granulation. She does not have any pain which is good news. Nonetheless the overall appearance of the ulcer is making me concerned for the patient having developed osteomyelitis. Currently she is not been on any antibiotics and the Wound VAC has been doing fairly well in general. With that being said I do not think we need to continue the Wound VAC if she potentially has a bone infection. At least not until it is properly addressed with antibiotics. Jessica [name], Janelys W. ([id number]) 07/09/17 on evaluation today patient appears to actually be doing very well in regard to her sacral ulcer compared to last week. There is actually no exposed bone at this point. 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  With that being said still I do believe she needs to have a referral to infectious disease due to the osteomyelitis I am gonna recommend that she continue with the doxycycline based on the culture results which showed Eikenella Corrodens as the organism identified that tetracycline should work for. She does not seem to be having any  discomfort whatsoever at this point. 07/16/17 on evaluation today patient actually appears to be doing rather well in regard to her sacral wound. I think that the original wound site actually appears much better than previously noted. With that being said she does have a new superficial injury on the right sacral region which appears to be due to according to the sign transport that occurred for her MRI unfortunately. Fortunately this is not too deep and I do think it can be managed but it was a new area that was not previously noted. Otherwise she has been tolerating the Dakin s soaked gauze packing without complication. 07/30/17 on evaluation today patient presents for reevaluation concerning her sacral wound. She has been tolerating the dressing changes without complication. With that being said her wound is doing so well at this point that I think she may be at the point where we could reinitiate the Wound VAC currently and hopefully see good results from this. I'm very pleased with how she has responded to the [facility] s soaked gauze packing. 08/13/17 evaluation today patient appears to be doing excellent in regard to her lower extremity ulcer this seems to be cleaning up very nicely. In regard to the sacral ulcer the area of trauma on the right lateral portion of the wound actually appears to be much better than previously noted during the last evaluation. The word has definitely filled in and the Wound VAC appears to be helpful I do believe. Overall I'm very pleased with how things seem to be progressing at this time. Patient likewise is also happy that things are doing well. 08/27/17 on evaluation today patient presents for follow-up concerning her ongoing issues with her sacral ulcer and lower extremity ulcer. Fortunately she has been tolerating the dressing changes well complication the Wound VAC in general seems to have done very well up to this point. She does not have any evidence of infection  which is good news. She does have some dark discoloration in central portion of the wound which is troublesome for the possibility of pressure injury to the site it sounds like her prior air mattress was not functioning properly her son has just bought a new one for her this is doing much better. Otherwise things seem to be progressing nicely. 09/10/17 on evaluation today patient presents for follow-up concerning her sacral wound and lower extremity ulcer. She has been tolerating the switch of her dressing changes to the Dakin s soaked gauze dressing very well in regard to the sacrum. The left lateral lower extremity ulcer appears to have a new area open just distal to the one that we have been treating with Santyl and this is new since her last visit. There does not appear to be any evidence of infection which is good news. With that being said there's a lot of maceration here at the site and I feel this is likely due to the switch and the dressings it appears that due to the sticking of the dressings it will switch to utilizing a telfa pad over the Santyl. I think this caused a lot of drainage to be collected and situated in the region  just under the dressing which has led to this causing which duration of breakdown. Overall there does not appear to be any significant pain which is good news. Of note I did actually have a conversation with the radiologist who is an interventional radiologist with [facility]. 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 With that being said there does not appear to be any rush to get this done according to the interventional radiologist whom I spoke with. Therefore after discussion it was determined that there gonna wait about six months prior to considering the procedure to get time for hopefully the sacral wound to heal in the interim to at least some degree. 09/17/17-She is here in  follow-up evaluation for sacral stage IV and lower from the ulcer. According to nursing staff these are all improved, the negative pressure wound therapy system was put on hold last week. We will resume negative pressure wound therapy today, continue with Santyl to the lower extremity and she will follow-up next week. 09/24/17 on evaluation today unfortunately the patient's wound appears to be doing significantly worse compared to last week's evaluation and even my valuation the week before. She actually has bone noted on the left wound margin in the region of undermining unfortunately. This was not noted during the last evaluation. I am concerned about the fact that this seems to be worse not better since our last visit with her. My biggest concern is that she's likely developing a worsening osteomyelitis. This was discussed with patient and her son today during the office visit. 05/03/17 on evaluation today patient actually appears to be doing rather well in regard to her sacral ulcer compared to last week's evaluation. She has been tolerating the dressing changes without complication. Fortunately even though we're not utilizing the Wound VAC she seems to be making some strides in seeing the wound area overall improved quite dramatically in my pinion in just one weeks time. She also seems to be staying off of this to the point that I do not see any evidence of new injury which is excellent news. Whatever she's been doing in that regard over the past week I would want her to continue. Jessica [name], Jessica [name] ([id number]) did also review the results of her bone culture which revealed group B strep as the responsible organism. Again this is what's causing her osteomyelitis she does have infectious disease appointment scheduled for 10/11/17 10/08/1898 valuation today patient appears to be doing better for the most part in regard to the sacral wound. Overall she is showing signs of having granulation of the  bone which is good news. There is an area towards the left of the wound where she does seem to be showing some signs of opening this it would be due to additional pressure to the area unfortunately. There does not appear to be any evidence of infection spreading which is good news that do not see any evidence of significant purulent discharge which is also good news. 10/26/17 on evaluation today patient sacral ulcer actually appears to be very healthy and doing well in regard to the overall appearance of the wound. With that being said she does seem to be having some issues at this point in time with some shear/friction injury of the sacrum from where she was transported to [facility] for her infectious disease appointment. She has been placed on IV antibiotic therapy I will have to go back and  find that note for review as I did not have access to that today. Nonetheless I will add the next visit be sure to document the exact antibiotics that she is taking at this point. Nonetheless I do believe the wound is doing better there's no bone exposure nothing that requires debridement in regard to the sacral wound. Likewise her left lateral lower extremity ulcer also appears to be doing significantly better. In general I feel like things are showing signs of improvement all around. 11/12/17 on evaluation today patient appears to be doing rather well in regard to her sacral wound. In general I do see signs of granulation at this point. Fortunately there does not appear to be any evidence of infection which is good news as well. In general overall happy with the appearance. With that being said I'll be see I would like for this to be progressing more rapidly as with the patient and her son but nonetheless at least things do not appear to be doing worse. Her lower extremity ulcer is doing significantly better. 11/26/17 on evaluation today patient appears to be doing a little better in regard to the sacral wound  especially in the peripheral of the wound bed. She actually did have an abrasion on the right gluteal region that's completely resolved to this point. Her lower extremity also appears to be for the most part completely resolved. Overall the sacrum itself seems to be the one thing that is still open and given her trouble. No fevers, chills, nausea, or vomiting noted at this time. She actually has an appointment next week with infectious disease for recheck to see how things are doing. She maybe switch to oral medications at that point. 12/09/17 on evaluation today patient actually appears to be doing much better in regard to her sacral wound. I do feel like she has made good progress at this time as far as healing is concerned. In fact the wound looks better to me today than it has in quite some time. She is on oral antibiotic therapy and no longer on the IV antibiotics. She did see infectious disease last week. 12/24/17 on evaluation today patient's wounds actually appear to be doing decently well in regard to the sacral wound in particular. When it comes to her lower extremity ulcers both appear to be completely healed at this point in the one area where she had lived the rash has completely resolved at this time. Overall very pleased with the progress that has been made. Nonetheless the patient seems to be doing well in general. She still does have quite a bit of healing to do in regard to the sacral wound but I feel like she is making progress. 01/07/18 on evaluation today patient actually appears to be doing excellent today regard to her sacral wound. The granular quality in the base of the wound is excellent and shown signs of good improvement there's no evidence of contusion nor deep tissue injury and the periwound actually appears to be doing well. In general I'm very pleased with the overall appearance. 02/04/18 on evaluation today patient's wound actually appears to be doing decently well as  far as the granulation is concerned. She has been tolerating the dressing changes without complication. Right now we are still using the Wound VAC. Nonetheless overall there apparently is some cater to the wound and this is concerning simply for the fact that she could be developing a soft tissue infection again. She's had this happen in the past and  at that time responded very well to the antibiotics. Nonetheless I don't think I would likely put her on anything prophylactically but rather wait for the results of the culture to return. 02/18/18 on evaluation today patient actually appears to be doing fairly well. She has been tolerating the dressing changes. And the Wound VAC seems to be doing well. Overall I'm very pleased with how things have improved over the past couple weeks since I last saw her. Fortunately there does not appear to be any evidence of overt infection at this time which is good news. She has been taking the antibiotics without complication. Specifically that is the Bactrim DS which was prescribed on 02/08/18. 03/18/18 on evaluation today patient's wound actually does have quite a bit of odor noted compared to previous. With that Jessica [name], Jessica [name]. ([id number]) being said the wound itself overall does not appear to be too significant or worse. There is no event noted in the wound bed which is good news. With that being said the patient has been worried as the home health nurse stated that she thought she saw bone in the base of the wound. Nonetheless there's a little bit of bruising along the left lateral border of the wound bed which again does not appear to be terrible we have noticed this before but other than this the majority the wound bed appears to be doing excellent in my pinion. I am wondering if there may be some bacterial colonization. This could be leading to some of the odor that we are noting with the Wound VAC. 04/01/18 on evaluation today patient appears to  actually be doing well in regard to the overall appearance of the wound itself. She also does not have any odor at this point in regard to the wound surface and I do believe the Bacons is extremely helpful in this regard. With that being said she does have a little bit of deep tissue injury on the medial aspect of the wound. Again last time it was more lateral. The lateral seems to have improved quite well. Nonetheless she also notes by way of her son that this has been very macerated in the past several weeks since I last saw her. Though things overall seem to be doing better I still think that the Wound VAC may help with managing her fluid better I'm wondering if we can attempt a gauze Wound VAC my hope is that this will be able to manage the moisture control as well as continuing with managing the odor of the wound and bacterial load since the Dakin s has done excellent in helping in that regard. 04/15/17 on evaluation today patient actually appears to be doing much better in regard to the overall appearance of her sacral wound. I do believe that the gauze Vac has been of benefit for her. Overall she is tolerating this without complication which is also good news. There is no sign of injury to the sacral region at this point. 04/29/18 on evaluation today patient appears to be doing well in regard to her sacral ulcer. She sure signs of good granulation and the Gauze VAC is doing very well for her. Overall I'm pleased in this regard. The biggest issue is that the time from Friday till Monday seems to be quite long for her as far as the amount of time to keep the Wound VAC sealed. For that reason her son wonders if he can take this off on Sunday morning in order to give her  day of just wet to dry dressing's with the Dakin's solution I think this would be okay. 05/13/18 on evaluation today patient appears to be doing okay in regard to her sacral ulcer. There does not appear to be any evidence of overt  infection at this time which is good news. In general the drainage and moisture appears to be significantly improved this was after the pressure on the Wound VAC was increased after they contacted our office. With that being said a lot of the dark tissue around the edge of the wound has improved in the maceration is significantly better this seems to be managing much better. Fortunately there is no signs of infection at this time. Overall the patient is doing quite well. The wound measurements are not significantly smaller today although they were over the past two visits we will see how things go. 05/27/18 on evaluation today patient's sacral wound actually appears to be doing fairly well at this point. She does have a deep tissue injury yet again on the medial aspect of the wound which is something that we seem to be seeing often. There is some question as to whether or not this is actually being caused by the Wound VAC bunching up and then her somewhat laying on it even though they are attempting to offload this as best as possible. Patient son states that overall they do the best they can trying offload her and I definitely believe him. I may want to see about putting the Wound VAC on hold for a couple weeks and see if this happens just with standard dressing changes. 06/10/18 on evaluation today patient actually appears to be doing better in regard to her sacral wound. Overall there's no signs of infection, no odor, and no significant skin breakdown around the edge of the wound I feel like this is done much better 50 Dakin s soaked gauze dressing as opposed to legalizing anything such as the Wound VAC. I believe that we discontinue the Wound VAC as of today. 06/24/18 on evaluation today patient actually appears to be doing well in regard to her sacral ulcer. There's no signs of infection, no odor, and in my pinion no concerns at all. I feel like she is making good progress as far as I'm  concerned. 07/12/18 on evaluation today patient appears to be doing very well in regard to her sacral ulcer. She has been tolerating the dressing changes without complication. Fortunately there does not appear to be any evidence of active infection at this time she is doing excellent as far as I'm concerned at this point. 08/11/18 on evaluation today patient's sacral wound actually appears to be doing quite well at this point. The main issue I see is that she continues to have some maceration and skin breakdown due to moisture over the right periwound location unfortunately. We've investigated the possibility of using zinc oxide before but have not utilized it simply due to the fact that she continues to state. Her son anyways that she is allergic to this. They have not tried it recently and may be completely unrelated to the reaction she had before. Nonetheless this may be something you want to attempt will discuss that in the plan. 10/04/18 on evaluation today patient actually appears to be doing quite well in regard to her sacral wound. It's been roughly 2 months since I last seen her in that time her wound has definitely made progress. This is very slow but again nonetheless Jessica [name], [name] (  [id number]) does seem to be trending in the correct direction. Overall very pleased in this regard. No fevers, chills, nausea, or vomiting noted at this time. 11/07/2018 on evaluation today patient appears to be doing decently well with regard to her sacral wound based on what I am seeing at this point. She is seen by way of a telehealth visit due to the COVID-19 national emergency and to be honest the patient is somewhat reluctant to come out to be seen for visits which I completely understand. They feel much safer keeping her home as much as possible due to her adequate condition in general. The only issue that she has had recently she did have some wheezing and subsequently chest congestion she was just  placed on albuterol along with prednisone at this point. She seems to be doing much better even after 24 hours of the prednisone according to her son Jessica [name]. Overall they are pleased with how things seem to be progressing. 11/28/2018 upon evaluation today patient is seen by way of telehealth visit since she is still somewhat reluctant to come out due to the COVID-19 pandemic. Subsequently she seems to be doing well and infected I could not obtain actual measurements for the wound the overall appearance of the wound is dramatically improved. She does not appear to have nearly as much space at the 12:00 location and in fact her son Jessica [name] who is taking care of her mainly states that he is barely able to get anything up into that region is filled and so nicely. Overall I feel like she is making great progress. 12/12/2018 patient is seen today via a telehealth visit again secondary to her concerns about coming out during the COVID-19 national pandemic. Nonetheless she appears to be doing quite well at this time which is good news. There is no signs of active infection at this time. Overall patient seems to be very pleased as is her son who takes very good care of her. Patient History Information obtained from Patient. Allergies zinc, Ambien Family History No family history of Cancer, Diabetes, Heart Disease. Social History Never smoker, Marital Status - Married. Review of Systems (ROS) Constitutional Symptoms (General Health) Denies complaints or symptoms of Fatigue, Fever, Chills, Marked Weight Change. Respiratory Denies complaints or symptoms of Chronic or frequent coughs, Shortness of Breath. Cardiovascular Denies complaints or symptoms of Chest pain, LE edema. Psychiatric Denies complaints or symptoms of Anxiety, Claustrophobia. Objective Constitutional Obese and well-hydrated in no acute distress. Respiratory Jessica [name], Jessica [name] W. ([id number]) normal breathing without  difficulty. Psychiatric this patient is able to make decisions and demonstrates good insight into disease process. Alert and Oriented x 3. pleasant and cooperative. General Notes: Patient's wound bed again best I can tell on examination seems to be showing signs of improvement which is good news. There is no evidence of active infection at this time which is also good news. I see no evidence of pressure injury and I think they are doing a very good job at offloading and keeping pressure off of the area. Overall I am pleased with her progress. Assessment Active Problems ICD-10 Pressure ulcer of sacral region, stage 4 Spinal stenosis, site unspecified Age-related physical debility Chronic pain syndrome Mixed hyperlipidemia Pressure ulcer of unspecified site, stage 3 Plan Wound Cleansing: Wound #1 Medial Sacrum: Other: - please cleanse sacral wound with dakins moistened gauze - do not spray dakins on wound Anesthetic (add to Medication List): Wound #1 Medial Sacrum: Topical Lidocaine 4% cream applied to wound bed prior  to debridement (In Clinic Only). Skin Barriers/Peri-Wound Care: Wound #1 Medial Sacrum: Skin Prep Primary Wound Dressing: Wound #1 Medial Sacrum: Pack wound with: - Dakin's soaked gauze Secondary Dressing: Wound #1 Medial Sacrum: ABD pad - secure with tape Dressing Change Frequency: Wound #1 Medial Sacrum: Change dressing twice daily. Follow-up Appointments: Wound #1 Medial Sacrum: Return Appointment in 2 weeks. Off-Loading: Wound #1 Medial Sacrum: Turn and reposition every 2 hours Jessica [name], [name] ([id number]) Additional Orders / Instructions: Wound #1 Medial Sacrum: Increase protein intake. Home Health: Wound #1 Medial Sacrum: Continue Home Health Visits - [facility] Nurse may visit PRN to address patient s wound care needs. FACE TO FACE ENCOUNTER: MEDICARE and MEDICAID PATIENTS: I certify that this patient is under my care and that I had a  face-to-face encounter that meets the physician face-to-face encounter requirements with this patient on this date. The encounter with the patient was in whole or in part for the following MEDICAL CONDITION: (primary reason for [place]) MEDICAL NECESSITY: I certify, that based on my findings, NURSING services are a medically necessary home health Jessica [name]. HOME BOUND STATUS: I certify that my clinical findings support that this patient is homebound (i.e., Due to illness or injury, pt requires aid of supportive devices such as crutches, cane, wheelchairs, walkers, the use of special transportation or the assistance of another person to leave their place of residence. There is a normal inability to leave the home and doing so requires considerable and taxing effort. Other absences are for medical reasons / religious services and are infrequent or of short duration when for other reasons). If current dressing causes regression in wound condition, may D/C ordered dressing product/s and apply Normal Saline Moist Dressing daily until next [facility] / Other MD appointment. [place] of regression in wound condition at [phone]. Please direct any NON-WOUND related issues/requests for orders to patient's Primary Care Physician 1. I would recommend that we continue with the current wound care measures which includes the Dakin's soaked gauze packing since she seems to be doing so well. 2. I recommend she continue with appropriate offloading which she seems to be doing in an excellent fashion based on what I am seeing. 3. We will continue with telehealth visits for the time current since she seems to be doing well with this and to be honest she is still somewhat reluctant to go out into public for visits. Nonetheless we did discuss having one more telehealth visit in 2 weeks and then subsequently a recheck in 1 month here in the clinic. We will see patient back for  reevaluation in 2 weeks here in the clinic. If anything worsens or changes patient will contact our office for additional recommendations. Electronic Signature(s) Signed: 12/12/2018 5:32:55 PM By: Worthy Keeler PA-C Entered By: Worthy Keeler on 12/12/2018 17:32:55 Jessica [name] ([id number]) -------------------------------------------------------------------------------- ROS/PFSH Details [name]. Date of Jessica [name]: 12/12/2018 4:00 PM Medical Record Number: [id number] Patient Account Number: [id number] Date of Birth/Sex: 1946-01-02 (74 y.o. F) Treating RN: Harold Barban Primary Care Provider: Clovia Cuff Other Clinician: Referring Provider: Clovia Cuff Treating Provider/Extender: STONE III, Naylah Cork Weeks in Treatment: 80 Information Obtained From Patient Constitutional Symptoms (General Health) Complaints and Symptoms: Negative for: Fatigue; Fever; Chills; Marked Weight Change Respiratory Complaints and Symptoms: Negative for: Chronic or frequent coughs; Shortness of Breath Cardiovascular Complaints and Symptoms: Negative for: Chest pain; LE edema Psychiatric Complaints and Symptoms: Negative for: Anxiety; Claustrophobia Immunizations Pneumococcal Vaccine: Received  Pneumococcal Vaccination: No Implantable Devices No devices added Family and Social History Cancer: No; Diabetes: No; Heart Disease: No; Never smoker; Marital Status - Married Physician Affirmation I have reviewed and agree with the above information. Electronic Signature(s) Signed: 12/12/2018 6:34:02 PM By: Lenda Kelp PA-C Signed: 12/14/2018 4:31:02 PM By: Jessica [name] Entered By: Lenda Kelp on 12/12/2018 16:16:34 Jessica [name] ([id number]) -------------------------------------------------------------------------------- SuperBill Details [name]. Date of Jessica [name]: 12/12/2018 Medical Record Number: [id number] Patient Account Number:  [id number] Date of Birth/Sex: 02-Sep-1945 (73 y.o. F) Treating RN: Jessica [name] Primary Care Provider: Annita Brod Other Clinician: Referring Provider: Annita Brod Treating Provider/Extender: STONE III, Carmon Brigandi Weeks in Treatment: 80 Diagnosis Coding ICD-10 Codes Code Description L89.154 Pressure ulcer of sacral region, stage 4 M48.00 Spinal stenosis, site unspecified R54 Age-related physical debility G89.4 Chronic pain syndrome E78.2 Mixed hyperlipidemia L89.93 Pressure ulcer of unspecified site, stage 3 Physician Procedures CPT4 Code: [id number] Description: 99213 - WC PHYS LEVEL 3 - EST PT ICD-10 Diagnosis Description L89.154 Pressure ulcer of sacral region, stage 4 M48.00 Spinal stenosis, site unspecified R54 Age-related physical debility G89.4 Chronic pain syndrome Modifier: Quantity: 1 Electronic Signature(s) Signed: 12/12/2018 5:33:14 PM By: Lenda Kelp PA-C Entered By: Lenda Kelp on 12/12/2018 17:33:14

## 2018-12-26 ENCOUNTER — Ambulatory Visit: Payer: Medicare Other | Admitting: Physician Assistant

## 2019-01-10 ENCOUNTER — Ambulatory Visit: Payer: Medicare Other | Admitting: Physician Assistant

## 2019-01-16 ENCOUNTER — Encounter: Payer: Medicare Other | Attending: Physician Assistant | Admitting: Physician Assistant

## 2019-01-16 ENCOUNTER — Other Ambulatory Visit: Payer: Self-pay

## 2019-01-16 DIAGNOSIS — G894 Chronic pain syndrome: Secondary | ICD-10-CM | POA: Diagnosis not present

## 2019-01-16 DIAGNOSIS — M4800 Spinal stenosis, site unspecified: Secondary | ICD-10-CM | POA: Diagnosis not present

## 2019-01-16 DIAGNOSIS — L8993 Pressure ulcer of unspecified site, stage 3: Secondary | ICD-10-CM | POA: Diagnosis not present

## 2019-01-16 DIAGNOSIS — L89154 Pressure ulcer of sacral region, stage 4: Secondary | ICD-10-CM | POA: Insufficient documentation

## 2019-01-16 DIAGNOSIS — R54 Age-related physical debility: Secondary | ICD-10-CM | POA: Insufficient documentation

## 2019-01-16 DIAGNOSIS — E782 Mixed hyperlipidemia: Secondary | ICD-10-CM | POA: Insufficient documentation

## 2019-01-16 IMAGING — CT CT HEAD W/O CM
3 series · 16 of 47 positions shown, 19 images · non-contrast
Comparison: Head CT 08/24/2016

CLINICAL DATA: Altered level of consciousness

EXAM:
CT HEAD WITHOUT CONTRAST
TECHNIQUE: Contiguous axial images were obtained from the base of the skull
through the vertex without intravenous contrast.

[Series 2: head wo · axial · 0.39mm/px · z∈[-184,-54]mm · 10 of 32 slices shown, 13 images]
[im 3/32  brain]
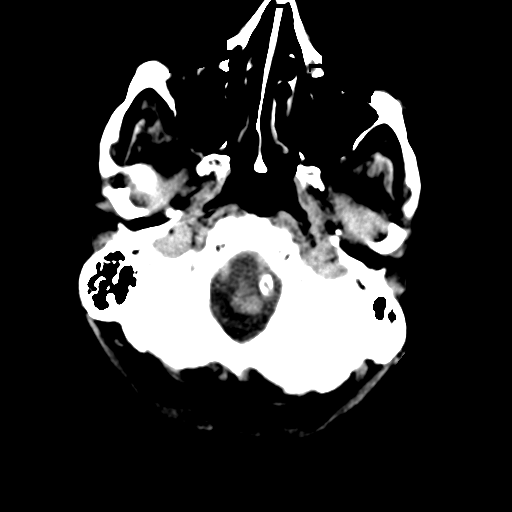
[im 3/32  bone]
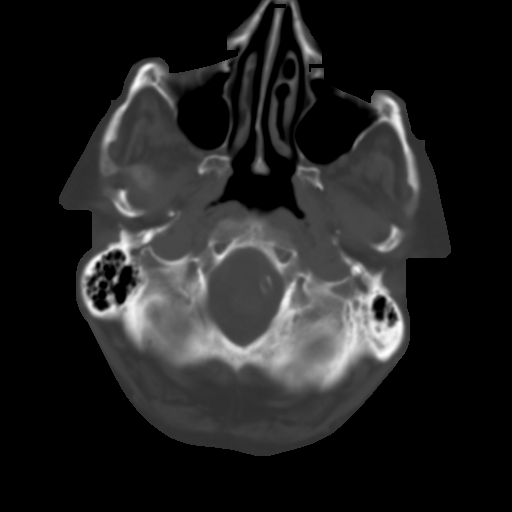
[im 6/32  brain]
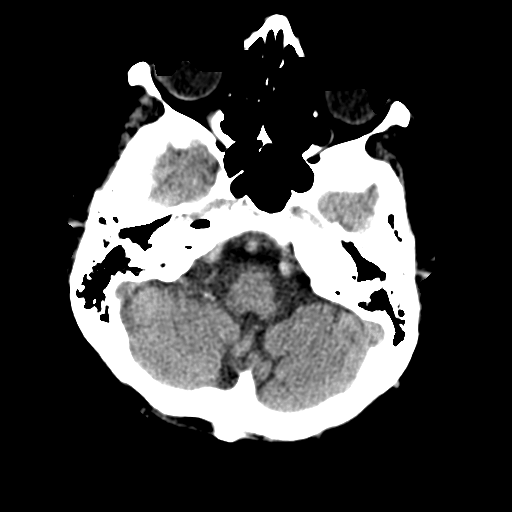
[im 9/32  brain]
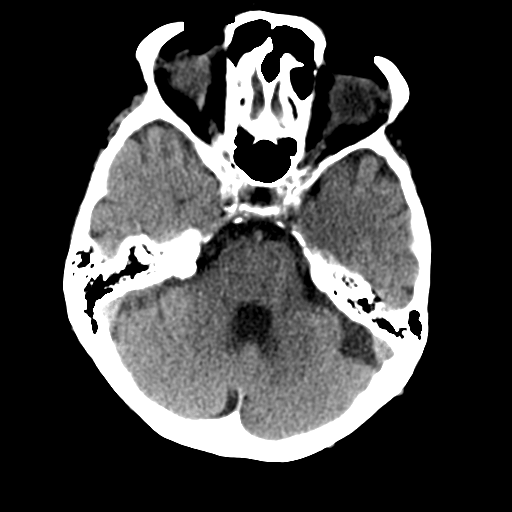
[im 11/32  brain]
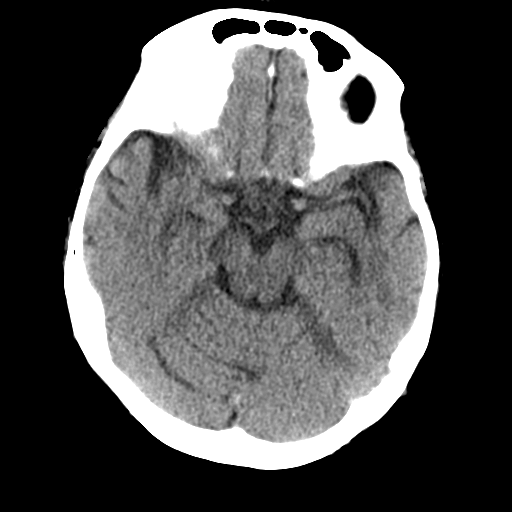
[im 14/32  brain]
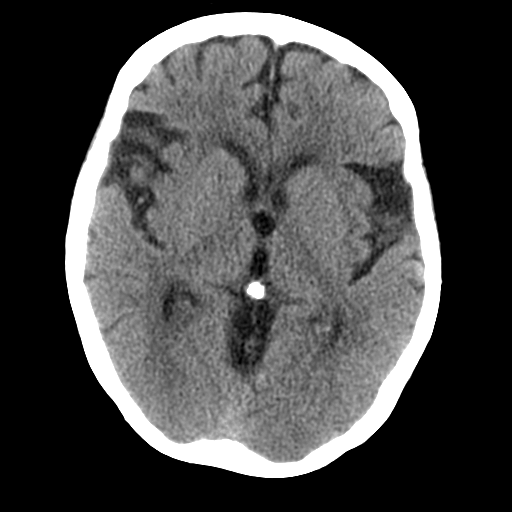
[im 14/32  bone]
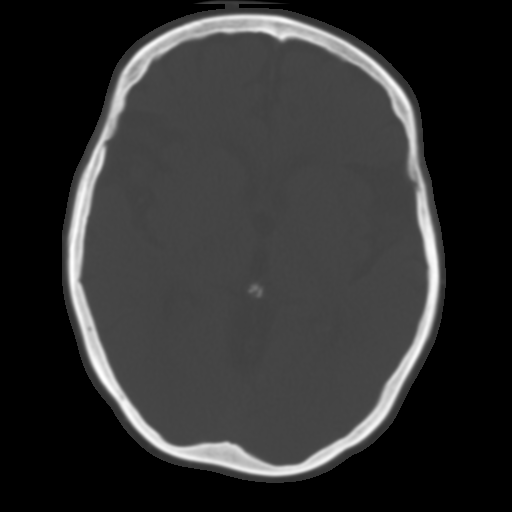
[im 18/32  brain]
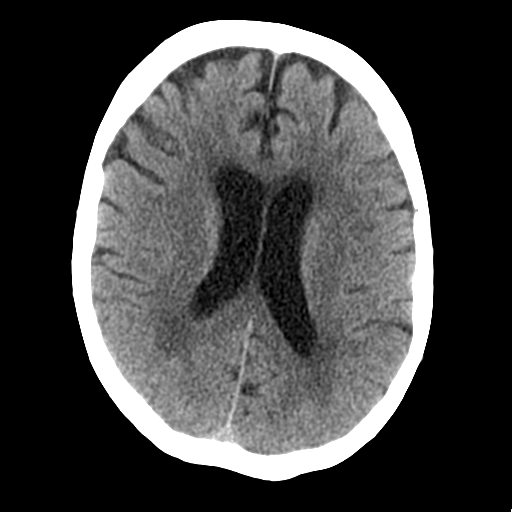
[im 21/32  brain]
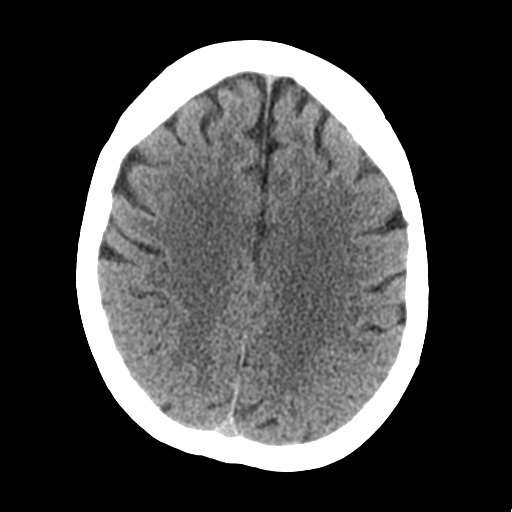
[im 24/32  brain]
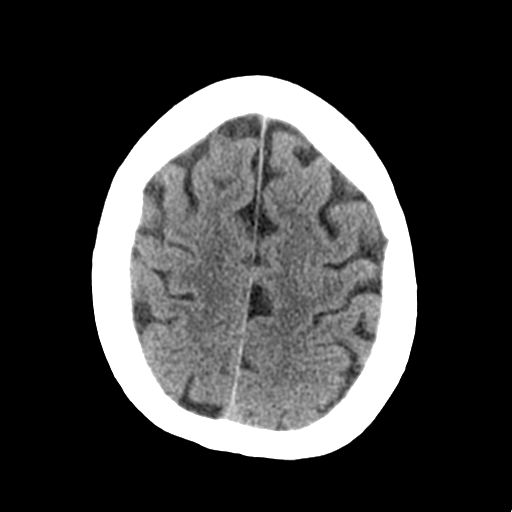
[im 26/32  brain]
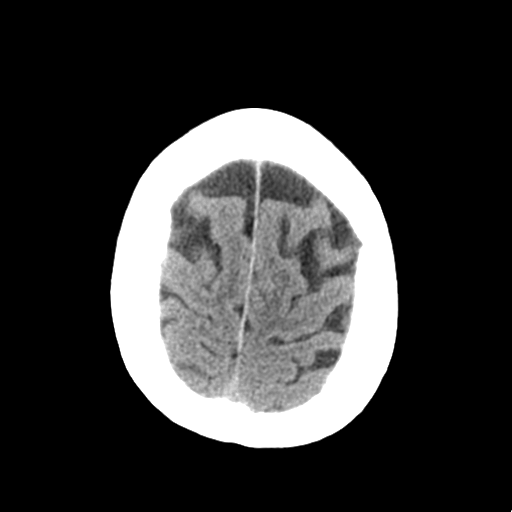
[im 26/32  bone]
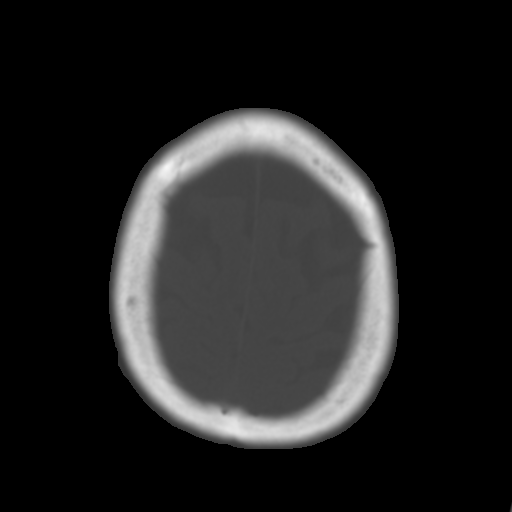
[im 29/32  brain]
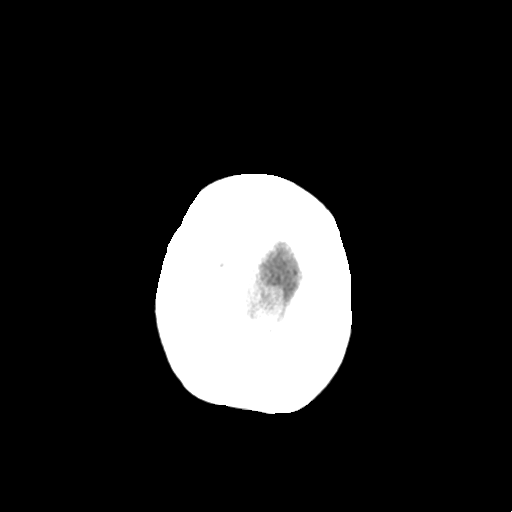

[Series 4: coronal soft tissue · coronal · 0.29mm/px · 3 of 63 slices shown]
[im 21/63  brain]
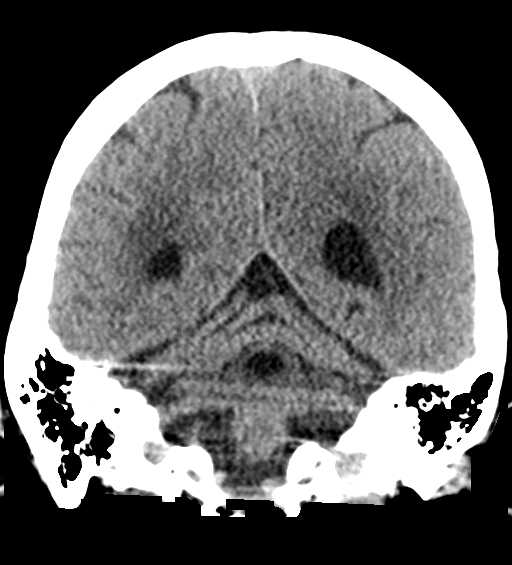
[im 28/63  brain]
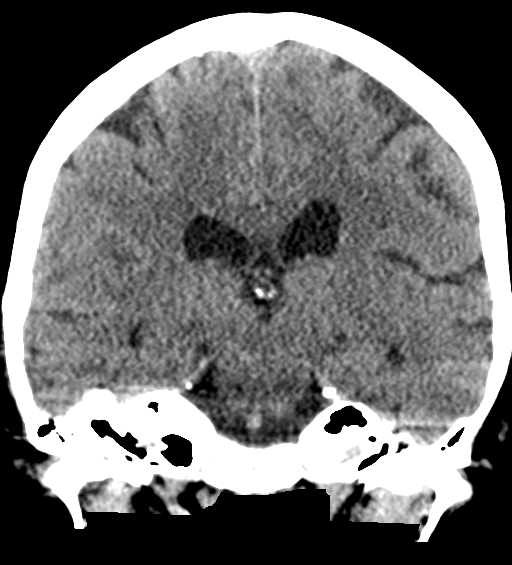
[im 35/63  brain]
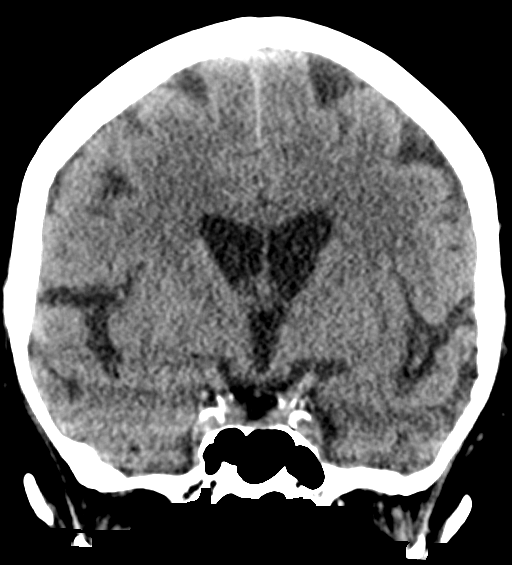

[Series 5: sagittal soft tissue · sagittal · 0.31mm/px · 3 of 49 slices shown]
[im 17/49  brain]
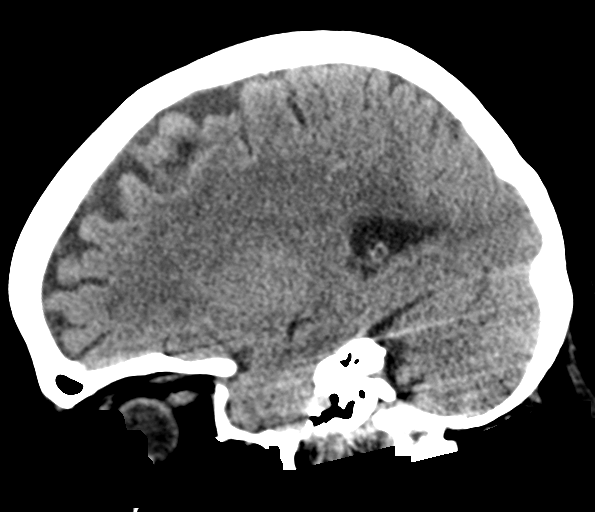
[im 25/49  brain]
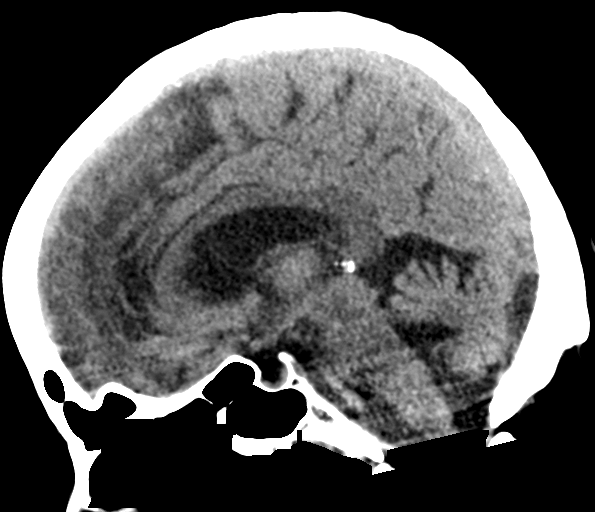
[im 33/49  brain]
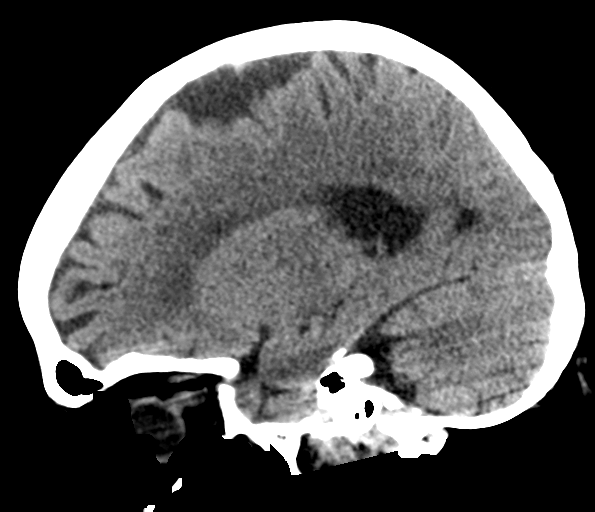

[16 of 47 positions shown; findings below may reference images not displayed]

FINDINGS: Brain: No mass lesion, intraparenchymal hemorrhage or extra-axial
collection. No evidence of acute cortical infarct. There is
periventricular hypoattenuation compatible with chronic
microvascular disease.

Vascular: Atherosclerotic calcification of the vertebral and
internal carotid arteries at the skull base.

Skull: Normal visualized skull base, calvarium and extracranial soft
tissues.

Sinuses/Orbits: No sinus fluid levels or advanced mucosal
thickening. No mastoid effusion. Normal orbits.
IMPRESSION: Chronic ischemic microangiopathy without acute intracranial
abnormality.

## 2019-01-16 NOTE — Progress Notes (Addendum)
Jessica [name] ([id number]) Visit Report for 01/16/2019 Chief Complaint Document Details Patient Name: ARNETA, Jessica [name]. Date of Service: 01/16/2019 10:45 AM Medical Record Number: [id number] Patient Account Number: [id number] Date of Birth/Sex: December 24, 1945 (73 y.o. F) Treating RN: Huel Coventry Primary Care Provider: Annita Brod Other Clinician: Referring Provider: Annita Brod Treating Provider/Extender: Linwood Dibbles, Jessica [name]: 86 Information Obtained from: Patient Chief Complaint she is here for evaluation of a sacral ulcer Electronic Signature(s) Signed: 01/16/2019 10:31:52 AM By: Lenda Kelp PA-C Entered By: Lenda Kelp on 01/16/2019 10:31:51 Jessica [name] ([id number]) -------------------------------------------------------------------------------- HPI Details [name]. Date of Service: 01/16/2019 10:45 AM Medical Record Number: [id number] Patient Account Number: [id number] Date of Birth/Sex: [birth year] (73 y.o. F) Treating RN: Huel Coventry Primary Care Provider: Annita Brod Other Clinician: Referring Provider: Annita Brod Treating Provider/Extender: Linwood Dibbles, Weaver Tweed Weeks in [name]: 29 History of Present Illness HPI Description: 05/27/17-she is here in initial evaluation for a left-sided sacral stage IV pressure ulcer and bilateral lower extremity, lateral aspect, unstageable pressure ulcers. She is accompanied by her husband and her son, who are her primary caregivers. She is bedbound secondary to spinal stenosis. According to her son and husband she was hospitalized from 10/5-11/1 with healthcare associated pneumonia and altered mental status. During her hospitalization she was intubated, extubated on 10/27. She was discharged with Foley catheter and follow up with urology. According to her son and spouse she developed the sacral ulcer during hospitalization. Home health has been applying Santyl daily. She does have a  low air loss mattress and is repositioned every 2-3 hours per her family's report. According to the son and spouse she had an appointment with urology on 12/18 and during that appointment developed discoloration to her bilateral lower extremities which ultimately developed into unstageable pressure ulcers to the lateral aspects of her bilateral lower extremities. There has been no topical [name] applied to these. She continues to have home health. There is no concerns expressed regarding dietary intake, stating she eats 3 meals a day, eating was provided; she is supplemented with boost with protein. 06/03/17-she is here in follow-up evaluation for sacral and bilateral lower extremity ulcers. Plain film x-ray done today reveals no distraction to the sacrum or coccyx, no visible abnormalities. Home health has ordered the negative pressure wound system but it has not been initiated. We will continue with Hydrofera Blue until initiation of negative pressure wound system and continue with Santyl to bilateral lower extremity ulcers. Follow-up next week 06/10/17-she is here in follow-up evaluation for sacral and bilateral lower extremity ulcers. The wound VAC will be available tomorrow per home health. We will initiate wound VAC therapy to the sacral ulcer 3 times weekly (Thursday, Saturday/Sunday, Tuesday). We will continue with Santyl to the lower extremity ulcers. The patient's son is checking into home health therapy over the weekend for [facility] changes, with the understanding that if VAC changes cannot be performed over the weekend he will need to change his mother's appointments to Monday, Wednesday or Friday. The sacral ulcer clinically does not appear infected but there has been a change in the amount of drainage acutely, there is no significant amount of devitalized tissue, there is no malodor. Wound culture was obtained to evaluate for occult infection we will hold off on antibiotic therapy until  sensitivities are resulted. 06/18/17 on evaluation today patient appears to be doing fairly well in my opinion although this is the first time I have seen this patient she  has been previously evaluated by Tacey RuizLeah here in our office. She is going to be switching to Fridays to see me due to the Wound VAC schedule being changed on Monday, Wednesday, and Friday. Subsequently she seems to be doing fairly well with the Wound VAC. Her son who was present during evaluation today states that he somewhat stresses of the Wound VAC in making sure that it was functioning appropriately. With that being said everything seems to be working well he knows what settings on the [facility] itself to look at and ensure that it is functioning properly. Overall the wound appears to be nice and clean there is no need for debridement today. She has no discomfort in her bilateral lower extremity ulcers also appear to be improving based on measurements and what this honest tell me about the overall appearance. 07/02/17 on evaluation today I noted in patients wound bed that was actually an odor that had not previously been noted. Subsequently there was also a small area of bone that was not previously noted during my last evaluation. This did appear to be necrotic and was being somewhat forced out by the body around the region of granulation. She does not have any pain which is good news. Nonetheless the overall appearance of the ulcer is making me concerned for the patient having developed osteomyelitis. Currently she is not been on any antibiotics and the Wound VAC has been doing fairly well in general. With that being said I do not think we need to continue the Wound VAC if she potentially has a bone infection. At least not until it is properly addressed with antibiotics. 07/09/17 on evaluation today patient appears to actually be doing very well in regard to her sacral ulcer compared to last week. There is actually no exposed bone at  this point. 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 With that being said still I do believe she needs to have a referral to infectious disease due to the osteomyelitis I am gonna recommend that she continue with the doxycycline based on the culture results which showed Eikenella Corrodens as the Jessica [name], [name] ([id number]) organism identified that tetracycline should work for. She does not seem to be having any discomfort whatsoever at this point. 07/16/17 on evaluation today patient actually appears to be doing rather well in regard to her sacral wound. I think that the original wound site actually appears much better than previously noted. With that being said she does have a new superficial injury on the right sacral region which appears to be due to according to the sign transport that occurred for her MRI unfortunately. Fortunately this is not too deep and I do think it can be managed but it was a new area that was not previously noted. Otherwise she has been tolerating the Dakinos soaked gauze packing without complication. 07/30/17 on evaluation today patient presents for reevaluation concerning her sacral wound. She has been tolerating the dressing changes without complication. With that being said her wound is doing so well at this point that I think she may be at the point where we could reinitiate the Wound VAC currently and hopefully see good results from this. I'm very pleased with how she has responded to the Dakinos soaked gauze packing. 08/13/17 evaluation today patient appears to be doing excellent in regard to her lower extremity  ulcer this seems to be cleaning up very nicely. In regard to the sacral ulcer the area of trauma on the right lateral portion of the wound actually appears to be much better than previously noted  during the last evaluation. The word has definitely filled in and the Wound VAC appears to be helpful I do believe. Overall I'm very pleased with how things seem to be progressing at this time. Patient likewise is also happy that things are doing well. 08/27/17 on evaluation today patient presents for follow-up concerning her ongoing issues with her sacral ulcer and lower extremity ulcer. Fortunately she has been tolerating the dressing changes well complication the Wound VAC in general seems to have done very well up to this point. She does not have any evidence of infection which is good news. She does have some dark discoloration in central portion of the wound which is troublesome for the possibility of pressure injury to the site it sounds like her prior air mattress was not functioning properly her son has just bought a new one for her this is doing much better. Otherwise things seem to be progressing nicely. 09/10/17 on evaluation today patient presents for follow-up concerning her sacral wound and lower extremity ulcer. She has been tolerating the switch of her dressing changes to the Dakinos soaked gauze dressing very well in regard to the sacrum. The left lateral lower extremity ulcer appears to have a new area open just distal to the one that we have been treating with Santyl and this is new since her last visit. There does not appear to be any evidence of infection which is good news. With that being said there's a lot of maceration here at the site and I feel this is likely due to the switch and the dressings it appears that due to the sticking of the dressings it will switch to utilizing a telfa pad over the Santyl. I think this caused a lot of drainage to be collected and situated in the region just under the dressing which has led to this causing which duration of breakdown. Overall there does not appear to be any significant pain which is good news. Of note I did actually have a  conversation with the radiologist who is an interventional radiologist with [facility]. 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 With that being said there does not appear to be any rush to get this done according to the interventional radiologist whom I spoke with. Therefore after discussion it was determined that there gonna wait about six months prior to considering the procedure to get time for hopefully the sacral wound to heal in the interim to at least some degree. 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 According to nursing staff these are all improved, the negative pressure wound therapy system was put on hold last week. We will resume negative pressure wound therapy today, continue with Santyl to the lower extremity and she will follow-up next week. 09/24/17 on evaluation today unfortunately the patient's wound appears to be doing significantly worse compared to last week's evaluation and even my valuation the week before. She actually has bone noted on the left wound margin in the region of undermining unfortunately. This was not noted during the last evaluation. I am concerned about the fact that this seems to be worse not better since our last visit with her.  My biggest concern is that she's likely developing a worsening osteomyelitis. This was discussed with patient and her son today during the office visit. 05/03/17 on evaluation today patient actually appears to be doing rather well in regard to her sacral ulcer compared to last week's evaluation. She has been tolerating the dressing changes without complication. Fortunately even though we're not utilizing the Wound VAC she seems to be making some strides in seeing the wound area overall improved quite dramatically in my pinion in just one weeks time. She also seems to be staying off of this to the point  that I do not see any evidence of new injury which is excellent news. Whatever she's been doing in that regard over the past week I would want her to continue. I did also review the results of her bone culture which revealed group B strep as the responsible organism. Again this is what's causing her osteomyelitis she does have infectious disease appointment scheduled for 10/11/17 10/08/1898 valuation today patient appears to be doing better for the most part in regard to the sacral wound. Overall she is showing signs of having granulation of the bone which is good news. There is an area towards the left of the wound where she does seem to be showing some signs of opening this it would be due to additional pressure to the area unfortunately. Jessica [name], PAULES. ([id number]) There does not appear to be any evidence of infection spreading which is good news that do not see any evidence of significant purulent discharge which is also good news. 10/26/17 on evaluation today patient sacral ulcer actually appears to be very healthy and doing well in regard to the overall appearance of the wound. With that being said she does seem to be having some issues at this point in time with some shear/friction injury of the sacrum from where she was transported to [facility] for her infectious disease appointment. She has been placed on IV antibiotic therapy I will have to go back and find that note for review as I did not have access to that today. Nonetheless I will add the next visit be sure to document the exact antibiotics that she is taking at this point. Nonetheless I do believe the wound is doing better there's no bone exposure nothing that requires debridement in regard to the sacral wound. Likewise her left lateral lower extremity ulcer also appears to be doing significantly better. In general I feel like things are showing signs of improvement all around. 11/12/17 on evaluation today patient appears to be doing  rather well in regard to her sacral wound. In general I do see signs of granulation at this point. Fortunately there does not appear to be any evidence of infection which is good news as well. In general overall happy with the appearance. With that being said I'll be see I would like for this to be progressing more rapidly as with the patient and her son but nonetheless at least things do not appear to be doing worse. Her lower extremity ulcer is doing significantly better. 11/26/17 on evaluation today patient appears to be doing a little better in regard to the sacral wound especially in the peripheral of the wound bed. She actually did have an abrasion on the right gluteal region that's completely resolved to this point. Her lower extremity also appears to be for the most part completely resolved. Overall the sacrum itself seems to be the one thing that is still open and given her  trouble. No fevers, chills, nausea, or vomiting noted at this time. She actually has an appointment next week with infectious disease for recheck to see how things are doing. She maybe switch to oral medications at that point. 12/09/17 on evaluation today patient actually appears to be doing much better in regard to her sacral wound. I do feel like she has made good progress at this time as far as healing is concerned. In fact the wound looks better to me today than it has in quite some time. She is on oral antibiotic therapy and no longer on the IV antibiotics. She did see infectious disease last week. 12/24/17 on evaluation today patient's wounds actually appear to be doing decently well in regard to the sacral wound in particular. When it comes to her lower extremity ulcers both appear to be completely healed at this point in the one area where she had lived the rash has completely resolved at this time. Overall very pleased with the progress that has been made. Nonetheless the patient seems to be doing well in general.  She still does have quite a bit of healing to do in regard to the sacral wound but I feel like she is making progress. 01/07/18 on evaluation today patient actually appears to be doing excellent today regard to her sacral wound. The granular quality in the base of the wound is excellent and shown signs of good improvement there's no evidence of contusion nor deep tissue injury and the periwound actually appears to be doing well. In general I'm very pleased with the overall appearance. 02/04/18 on evaluation today patient's wound actually appears to be doing decently well as far as the granulation is concerned. She has been tolerating the dressing changes without complication. Right now we are still using the Wound VAC. Nonetheless overall there apparently is some cater to the wound and this is concerning simply for the fact that she could be developing a soft tissue infection again. She's had this happen in the past and at that time responded very well to the antibiotics. Nonetheless I don't think I would likely put her on anything prophylactically but rather wait for the results of the culture to return. 02/18/18 on evaluation today patient actually appears to be doing fairly well. She has been tolerating the dressing changes. And the Wound VAC seems to be doing well. Overall I'm very pleased with how things have improved over the past couple weeks since I last saw her. Fortunately there does not appear to be any evidence of overt infection at this time which is good news. She has been taking the antibiotics without complication. Specifically that is the Bactrim DS which was prescribed on 02/08/18. 03/18/18 on evaluation today patient's wound actually does have quite a bit of odor noted compared to previous. With that being said the wound itself overall does not appear to be too significant or worse. There is no event noted in the wound bed which is good news. With that being said the patient has  been worried as the home health nurse stated that she thought she saw bone in the base of the wound. Nonetheless there's a little bit of bruising along the left lateral border of the wound bed which again does not appear to be terrible we have noticed this before but other than this the majority the wound bed appears to be doing excellent in my pinion. I am wondering if there may be some bacterial colonization. This could be leading to some  of the odor that we are noting with the Wound VAC. Jessica [name], Jessica [name] ([id number]) 04/01/18 on evaluation today patient appears to actually be doing well in regard to the overall appearance of the wound itself. She also does not have any odor at this point in regard to the wound surface and I do believe the Bacons is extremely helpful in this regard. With that being said she does have a little bit of deep tissue injury on the medial aspect of the wound. Again last time it was more lateral. The lateral seems to have improved quite well. Nonetheless she also notes by way of her son that this has been very macerated in the past several weeks since I last saw her. Though things overall seem to be doing better I still think that the Wound VAC may help with managing her fluid better I'm wondering if we can attempt a gauze Wound VAC my hope is that this will be able to manage the moisture control as well as continuing with managing the odor of the wound and bacterial load since the Dakinos has done excellent in helping in that regard. 04/15/17 on evaluation today patient actually appears to be doing much better in regard to the overall appearance of her sacral wound. I do believe that the gauze Vac has been of benefit for her. Overall she is tolerating this without complication which is also good news. There is no sign of injury to the sacral region at this point. 04/29/18 on evaluation today patient appears to be doing well in regard to her sacral ulcer. She sure signs of  good granulation and the Gauze VAC is doing very well for her. Overall I'm pleased in this regard. The biggest issue is that the time from Friday till Monday seems to be quite long for her as far as the amount of time to keep the Wound VAC sealed. For that reason her son wonders if he can take this off on Sunday morning in order to give her day of just wet to dry dressing's with the Dakin's solution I think this would be okay. 05/13/18 on evaluation today patient appears to be doing okay in regard to her sacral ulcer. There does not appear to be any evidence of overt infection at this time which is good news. In general the drainage and moisture appears to be significantly improved this was after the pressure on the Wound VAC was increased after they contacted our office. With that being said a lot of the dark tissue around the edge of the wound has improved in the maceration is significantly better this seems to be managing much better. Fortunately there is no signs of infection at this time. Overall the patient is doing quite well. The wound measurements are not significantly smaller today although they were over the past two visits we will see how things go. 05/27/18 on evaluation today patient's sacral wound actually appears to be doing fairly well at this point. She does have a deep tissue injury yet again on the medial aspect of the wound which is something that we seem to be seeing often. There is some question as to whether or not this is actually being caused by the Wound VAC bunching up and then her somewhat laying on it even though they are attempting to offload this as best as possible. Patient son states that overall they do the best they can trying offload her and I definitely believe him. I may want to see about  putting the Wound VAC on hold for a couple weeks and see if this happens just with standard dressing changes. 06/10/18 on evaluation today patient actually appears to be doing  better in regard to her sacral wound. Overall there's no signs of infection, no odor, and no significant skin breakdown around the edge of the wound I feel like this is done much better 50 Dakinos soaked gauze dressing as opposed to legalizing anything such as the Wound VAC. I believe that we discontinue the Wound VAC as of today. 06/24/18 on evaluation today patient actually appears to be doing well in regard to her sacral ulcer. There's no signs of infection, no odor, and in my pinion no concerns at all. I feel like she is making good progress as far as I'm concerned. 07/12/18 on evaluation today patient appears to be doing very well in regard to her sacral ulcer. She has been tolerating the dressing changes without complication. Fortunately there does not appear to be any evidence of active infection at this time she is doing excellent as far as I'm concerned at this point. 08/11/18 on evaluation today patient's sacral wound actually appears to be doing quite well at this point. The main issue I see is that she continues to have some maceration and skin breakdown due to moisture over the right periwound location unfortunately. We've investigated the possibility of using zinc oxide before but have not utilized it simply due to the fact that she continues to state. Her son anyways that she is allergic to this. They have not tried it recently and may be completely unrelated to the reaction she had before. Nonetheless this may be something you want to attempt will discuss that in the plan. 10/04/18 on evaluation today patient actually appears to be doing quite well in regard to her sacral wound. It's been roughly 2 months since I last seen her in that time her wound has definitely made progress. This is very slow but again nonetheless does seem to be trending in the correct direction. Overall very pleased in this regard. No fevers, chills, nausea, or vomiting noted at this time. 11/07/2018 on evaluation  today patient appears to be doing decently well with regard to her sacral wound based on what I am seeing at this point. She is seen by way of a telehealth visit due to the COVID-19 national emergency and to be honest the patient is somewhat reluctant to come out to be seen for visits which I completely understand. They feel much safer keeping Jessica [name], Renesmae W. ([id number]) her home as much as possible due to her adequate condition in general. The only issue that she has had recently she did have some wheezing and subsequently chest congestion she was just placed on albuterol along with prednisone at this point. She seems to be doing much better even after 24 hours of the prednisone according to her son [name]. Overall they are pleased with how things seem to be progressing. 11/28/2018 upon evaluation today patient is seen by way of telehealth visit since she is still somewhat reluctant to come out due to the COVID-19 pandemic. Subsequently she seems to be doing well and infected I could not obtain actual measurements for the wound the overall appearance of the wound is dramatically improved. She does not appear to have nearly as much space at the 12:00 location and in fact her son [name] who is taking care of her mainly states that he is barely able to get anything up into  that region is filled and so nicely. Overall I feel like she is making great progress. 12/12/2018 patient is seen today via a telehealth visit again secondary to her concerns about coming out during the COVID-19 national pandemic. Nonetheless she appears to be doing quite well at this time which is good news. There is no signs of active infection at this time. Overall patient seems to be very pleased as is her son who takes very good care of her. 01/16/2019 on evaluation today patient actually appears to be doing very well with regard to her wound. We have been doing zoom calls quite a bit since the COVID-19 pandemic began just due  to the fact that she has been somewhat concerned about coming out. With that being said she seems to have done very well and in fact the wound is measuring quite a bit smaller today compared to the last measurement. Fortunately there is no signs of active infection at this time. She is also having great improvement overall in her demeanor she is very upbeat and pleased with how things have progressed. Electronic Signature(s) Signed: 01/16/2019 10:40:32 AM By: Lenda Kelp PA-C Entered By: Lenda Kelp on 01/16/2019 10:40:32 Jessica [name] ([id number]) -------------------------------------------------------------------------------- Physical Exam Details [name]. Date of Service: 01/16/2019 10:45 AM Medical Record Number: [id number] Patient Account Number: [id number] Date of Birth/Sex: 1945/06/20 (73 y.o. F) Treating RN: Huel Coventry Primary Care Provider: Annita Brod Other Clinician: Referring Provider: Annita Brod Treating Provider/Extender: STONE III, Zeno Hickel Weeks in [name]: 9 Constitutional Well-nourished and well-hydrated in no acute distress. Respiratory normal breathing without difficulty. clear to auscultation bilaterally. Cardiovascular regular rate and rhythm with normal S1, S2. Psychiatric this patient is able to make decisions and demonstrates good insight into disease process. Alert and Oriented x 3. pleasant and cooperative. Notes Patient's wound bed currently showed signs of excellent granulation there does not appear to be any bruising nor deep tissue injury noted at this point everything seems to be going excellent as far as I am concerned. There is no signs of active infection and overall I feel like they have a good [name] plan going at this time. The key is good to be just keeping things along this line in order. Electronic Signature(s) Signed: 01/16/2019 10:40:57 AM By: Lenda Kelp PA-C Entered By: Lenda Kelp on  01/16/2019 10:40:56 Jessica [name] ([id number]) -------------------------------------------------------------------------------- Physician Orders Details [name]. Date of Service: 01/16/2019 10:45 AM Medical Record Number: [id number] Patient Account Number: [id number] Date of Birth/Sex: [birth year] (73 y.o. 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 Skin Barriers/Peri-Wound Care Wound #1 Medial Sacrum o Skin Prep Primary Wound Dressing Wound #1 Medial Sacrum o Pack wound with: - Dakin's soaked gauze Secondary Dressing Wound #1 Medial Sacrum o ABD pad - secure with tape Dressing Change Frequency Wound #1 Medial  Sacrum o Change dressing twice daily. Follow-up Appointments Wound #1 Medial Sacrum o Return Appointment in 1 month Off-Loading Wound #1 Medial Sacrum o Turn and reposition every 2 hours [name], [name] ([id number]) Additional Orders / Instructions Wound #1 Medial Sacrum o Increase protein intake. Home Health Wound #1 Medial Sacrum o Continue Home Health Visits - [facility] o Home Health Nurse may visit PRN to address patientos wound care needs. o FACE TO FACE ENCOUNTER: MEDICARE and  MEDICAID PATIENTS: I certify that this patient is under my care and that I had a face-to-face encounter that meets the physician face-to-face encounter requirements with this patient on this date. The encounter with the patient was in whole or in part for the following MEDICAL CONDITION: (primary reason for Home Healthcare) MEDICAL NECESSITY: I certify, that based on my findings, NURSING services are a medically necessary home health service. HOME BOUND STATUS: I certify that my clinical findings support that this patient is homebound (i.e., Due to illness or injury, pt requires aid of supportive devices such as crutches, cane, wheelchairs, walkers, the use of special transportation or the assistance of another person to leave their place of residence. There is a normal inability to leave the home and doing so requires considerable and taxing effort. Other absences are for medical reasons / religious services and are infrequent or of short duration when for other reasons). o If current dressing causes regression in wound condition, may D/C ordered dressing product/s and apply Normal Saline Moist Dressing daily until next Wound Healing Center / Other MD appointment. Notify Wound Healing Center of regression in wound condition at [phone]. o Please direct any NON-WOUND related issues/requests for orders to patient's Primary Care Physician Electronic Signature(s) Signed: 01/16/2019 4:22:49 PM By: Lenda Kelp PA-C Signed: 01/16/2019 4:49:27 PM By: Elliot Gurney, BSN, RN, CWS, Kim RN, BSN Entered By: Elliot Gurney, BSN, RN, CWS, Kim on 01/16/2019 10:37:10 Jessica [name] ([id number]) -------------------------------------------------------------------------------- Problem List Details [name]. Date of Service: 01/16/2019 10:45 AM Medical Record Number: [id number] Patient Account Number: [id number] Date of Birth/Sex: 1946/01/26 (74 y.o. F) Treating RN: Huel Coventry Primary Care  Provider: Annita Brod Other Clinician: Referring Provider: Annita Brod Treating Provider/Extender: Linwood Dibbles, Eveleen Mcnear Weeks in [name]: 91 Active Problems ICD-10 Evaluated Encounter Code Description Active Date Today Diagnosis L89.154 Pressure ulcer of sacral region, stage 4 05/27/2017 No Yes M48.00 Spinal stenosis, site unspecified 05/27/2017 No Yes R54 Age-related physical debility 05/27/2017 No Yes G89.4 Chronic pain syndrome 05/27/2017 No Yes E78.2 Mixed hyperlipidemia 05/27/2017 No Yes L89.93 Pressure ulcer of unspecified site, stage 3 05/27/2017 No Yes Inactive Problems Resolved Problems Electronic Signature(s) Signed: 01/16/2019 10:31:46 AM By: Lenda Kelp PA-C Entered By: Lenda Kelp on 01/16/2019 10:31:45 Jessica [name] ([id number]) -------------------------------------------------------------------------------- Progress Note Details [name]. Date of Service: 01/16/2019 10:45 AM Medical Record Number: [id number] Patient Account Number: [id number] Date of Birth/Sex: 01-29-1946 (73 y.o. F) Treating RN: Huel Coventry Primary Care Provider: Annita Brod Other Clinician: Referring Provider: Annita Brod Treating Provider/Extender: Linwood Dibbles, Kadeja Granada Weeks in [name]: 12 Subjective Chief Complaint Information obtained from Patient she is here for evaluation of a sacral ulcer History of Present Illness (HPI) 05/27/17-she is here in initial evaluation for a left-sided sacral stage IV pressure ulcer and bilateral lower extremity, lateral aspect, unstageable pressure ulcers. She is accompanied by her husband and her son, who are her primary caregivers. She is bedbound secondary to spinal stenosis. According to her son and husband she was hospitalized  from 10/5-11/1 with healthcare associated pneumonia and altered mental status. During her hospitalization she was intubated, extubated on 10/27. She was discharged with Foley catheter and follow up  with urology. According to her son and spouse she developed the sacral ulcer during hospitalization. Home health has been applying Santyl daily. She does have a low air loss mattress and is repositioned every 2-3 hours per her family's report. According to the son and spouse she had an appointment with urology on 12/18 and during that appointment developed discoloration to her bilateral lower extremities which ultimately developed into unstageable pressure ulcers to the lateral aspects of her bilateral lower extremities. There has been no topical [name] applied to these. She continues to have home health. There is no concerns expressed regarding dietary intake, stating she eats 3 meals a day, eating was provided; she is supplemented with boost with protein. 06/03/17-she is here in follow-up evaluation for sacral and bilateral lower extremity ulcers. Plain film x-ray done today reveals no distraction to the sacrum or coccyx, no visible abnormalities. Home health has ordered the negative pressure wound system but it has not been initiated. We will continue with Hydrofera Blue until initiation of negative pressure wound system and continue with Santyl to bilateral lower extremity ulcers. Follow-up next week 06/10/17-she is here in follow-up evaluation for sacral and bilateral lower extremity ulcers. The wound VAC will be available tomorrow per home health. We will initiate wound VAC therapy to the sacral ulcer 3 times weekly (Thursday, Saturday/Sunday, Tuesday). We will continue with Santyl to the lower extremity ulcers. The patient's son is checking into home health therapy over the weekend [facility], with the understanding that if VAC changes cannot be performed over the weekend he will need to change his mother's appointments to Monday, Wednesday or Friday. The sacral ulcer clinically does not appear infected but there has been a change in the amount of drainage acutely, there is no  significant amount of devitalized tissue, there is no malodor. Wound culture was obtained to evaluate for occult infection we will hold off on antibiotic therapy until sensitivities are resulted. 06/18/17 on evaluation today patient appears to be doing fairly well in my opinion although this is the first time I have seen this patient she has been previously evaluated by Tacey RuizLeah here in our office. She is going to be switching to Fridays to see me due to the Wound VAC schedule being changed on Monday, Wednesday, and Friday. Subsequently she seems to be doing fairly well with the Wound VAC. Her son who was present during evaluation today states that he somewhat stresses of the Wound VAC in making sure that it was functioning appropriately. With that being said everything seems to be working well he knows what [facility] to look at and ensure that it is functioning properly. Overall the wound appears to be nice and clean there is no need for debridement today. She has no discomfort in her bilateral lower extremity ulcers also appear to be improving based on measurements and what this honest tell me about the overall appearance. 07/02/17 on evaluation today I noted in patients wound bed that was actually an odor that had not previously been noted. Subsequently there was also a small area of bone that was not previously noted during my last evaluation. This did appear to be necrotic and was being somewhat forced out by the body around the region of granulation. She does not have any pain which is good news.  Nonetheless the overall appearance of the ulcer is making me concerned for the patient having developed osteomyelitis. Currently she is not been on any antibiotics and the Wound VAC has been doing fairly well in general. With that being said I do not think we need to continue the Wound VAC if she potentially has a bone infection. At least not until it is properly addressed with  antibiotics. DEBRA, COLON ([id number]) 07/09/17 on evaluation today patient appears to actually be doing very well in regard to her sacral ulcer compared to last week. There is actually no exposed bone at this point. 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 With that being said still I do believe she needs to have a referral to infectious disease due to the osteomyelitis I am gonna recommend that she continue with the doxycycline based on the culture results which showed Eikenella Corrodens as the organism identified that tetracycline should work for. She does not seem to be having any discomfort whatsoever at this point. 07/16/17 on evaluation today patient actually appears to be doing rather well in regard to her sacral wound. I think that the original wound site actually appears much better than previously noted. With that being said she does have a new superficial injury on the right sacral region which appears to be due to according to the sign transport that occurred for her MRI unfortunately. Fortunately this is not too deep and I do think it can be managed but it was a new area that was not previously noted. Otherwise she has been tolerating the Dakin s soaked gauze packing without complication. 07/30/17 on evaluation today patient presents for reevaluation concerning her sacral wound. She has been tolerating the dressing changes without complication. With that being said her wound is doing so well at this point that I think she may be at the point where we could reinitiate the Wound VAC currently and hopefully see good results from this. I'm very pleased with how she has responded to the [facility] s soaked gauze packing. 08/13/17 evaluation today patient appears to be doing excellent in regard to her lower extremity ulcer this seems to  be cleaning up very nicely. In regard to the sacral ulcer the area of trauma on the right lateral portion of the wound actually appears to be much better than previously noted during the last evaluation. The word has definitely filled in and the Wound VAC appears to be helpful I do believe. Overall I'm very pleased with how things seem to be progressing at this time. Patient likewise is also happy that things are doing well. 08/27/17 on evaluation today patient presents for follow-up concerning her ongoing issues with her sacral ulcer and lower extremity ulcer. Fortunately she has been tolerating the dressing changes well complication the Wound VAC in general seems to have done very well up to this point. She does not have any evidence of infection which is good news. She does have some dark discoloration in central portion of the wound which is troublesome for the possibility of pressure injury to the site it sounds like her prior air mattress was not functioning properly her son has just bought a new one for her this is doing much better. Otherwise things seem to be progressing nicely. 09/10/17 on evaluation today patient presents for follow-up concerning her sacral wound and lower  extremity ulcer. She has been tolerating the switch of her dressing changes to the Dakin s soaked gauze dressing very well in regard to the sacrum. The left lateral lower extremity ulcer appears to have a new area open just distal to the one that we have been treating with Santyl and this is new since her last visit. There does not appear to be any evidence of infection which is good news. With that being said there's a lot of maceration here at the site and I feel this is likely due to the switch and the dressings it appears that due to the sticking of the dressings it will switch to utilizing a telfa pad over the Santyl. I think this caused a lot of drainage to be collected and situated in the region just under the  dressing which has led to this causing which duration of breakdown. Overall there does not appear to be any significant pain which is good news. Of note I did actually have a conversation with the radiologist who is an interventional radiologist with [facility]. 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 With that being said there does not appear to be any rush to get this done according to the interventional radiologist whom I spoke with. Therefore after discussion it was determined that there gonna wait about six months prior to considering the procedure to get time for hopefully the sacral wound to heal in the interim to at least some degree. 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 According to nursing staff these are all improved, the negative pressure wound therapy system was put on hold last week. We will resume negative pressure wound therapy today, continue with Santyl to the lower extremity and she will follow-up next week. 09/24/17 on evaluation today unfortunately the patient's wound appears to be doing significantly worse compared to last week's evaluation and even my valuation the week before. She actually has bone noted on the left wound margin in the region of undermining unfortunately. This was not noted during the last evaluation. I am concerned about the fact that this seems to be worse not better since our last visit with her. My biggest concern is that she's likely developing a worsening osteomyelitis. This was discussed with patient and her son today during the office visit. 05/03/17 on evaluation today patient actually appears to be doing rather well in regard to her sacral ulcer compared to last week's evaluation. She has been tolerating the dressing changes without complication. Fortunately even though we're not utilizing the Wound VAC she seems to  be making some strides in seeing the wound area overall improved quite dramatically in my pinion in just one weeks time. She also seems to be staying off of this to the point that I do not see any evidence of new injury which is excellent news. Whatever she's been doing in that regard over the past week I would want her to continue. Jessica [name], Jessica [name] ([id number]) did also review the results of her bone culture which revealed group B strep as the responsible organism. Again this is what's causing her osteomyelitis she does have infectious disease appointment scheduled for 10/11/17 10/08/1898 valuation today patient appears to be doing better for the most part in regard to the sacral wound. Overall she is showing signs of having granulation of the bone which is good news. There is an  area towards the left of the wound where she does seem to be showing some signs of opening this it would be due to additional pressure to the area unfortunately. There does not appear to be any evidence of infection spreading which is good news that do not see any evidence of significant purulent discharge which is also good news. 10/26/17 on evaluation today patient sacral ulcer actually appears to be very healthy and doing well in regard to the overall appearance of the wound. With that being said she does seem to be having some issues at this point in time with some shear/friction injury of the sacrum from where she was transported to [facility] for her infectious disease appointment. She has been placed on IV antibiotic therapy I will have to go back and find that note for review as I did not have access to that today. Nonetheless I will add the next visit be sure to document the exact antibiotics that she is taking at this point. Nonetheless I do believe the wound is doing better there's no bone exposure nothing that requires debridement in regard to the sacral wound. Likewise her left lateral lower extremity ulcer  also appears to be doing significantly better. In general I feel like things are showing signs of improvement all around. 11/12/17 on evaluation today patient appears to be doing rather well in regard to her sacral wound. In general I do see signs of granulation at this point. Fortunately there does not appear to be any evidence of infection which is good news as well. In general overall happy with the appearance. With that being said I'll be see I would like for this to be progressing more rapidly as with the patient and her son but nonetheless at least things do not appear to be doing worse. Her lower extremity ulcer is doing significantly better. 11/26/17 on evaluation today patient appears to be doing a little better in regard to the sacral wound especially in the peripheral of the wound bed. She actually did have an abrasion on the right gluteal region that's completely resolved to this point. Her lower extremity also appears to be for the most part completely resolved. Overall the sacrum itself seems to be the one thing that is still open and given her trouble. No fevers, chills, nausea, or vomiting noted at this time. She actually has an appointment next week with infectious disease for recheck to see how things are doing. She maybe switch to oral medications at that point. 12/09/17 on evaluation today patient actually appears to be doing much better in regard to her sacral wound. I do feel like she has made good progress at this time as far as healing is concerned. In fact the wound looks better to me today than it has in quite some time. She is on oral antibiotic therapy and no longer on the IV antibiotics. She did see infectious disease last week. 12/24/17 on evaluation today patient's wounds actually appear to be doing decently well in regard to the sacral wound in particular. When it comes to her lower extremity ulcers both appear to be completely healed at this point in the one area where  she had lived the rash has completely resolved at this time. Overall very pleased with the progress that has been made. Nonetheless the patient seems to be doing well in general. She still does have quite a bit of healing to do in regard to the sacral wound but I feel like she is making  progress. 01/07/18 on evaluation today patient actually appears to be doing excellent today regard to her sacral wound. The granular quality in the base of the wound is excellent and shown signs of good improvement there's no evidence of contusion nor deep tissue injury and the periwound actually appears to be doing well. In general I'm very pleased with the overall appearance. 02/04/18 on evaluation today patient's wound actually appears to be doing decently well as far as the granulation is concerned. She has been tolerating the dressing changes without complication. Right now we are still using the Wound VAC. Nonetheless overall there apparently is some cater to the wound and this is concerning simply for the fact that she could be developing a soft tissue infection again. She's had this happen in the past and at that time responded very well to the antibiotics. Nonetheless I don't think I would likely put her on anything prophylactically but rather wait for the results of the culture to return. 02/18/18 on evaluation today patient actually appears to be doing fairly well. She has been tolerating the dressing changes. And the Wound VAC seems to be doing well. Overall I'm very pleased with how things have improved over the past couple weeks since I last saw her. Fortunately there does not appear to be any evidence of overt infection at this time which is good news. She has been taking the antibiotics without complication. Specifically that is the Bactrim DS which was prescribed on 02/08/18. 03/18/18 on evaluation today patient's wound actually does have quite a bit of odor noted compared to previous. With  that Jessica [name], HENTGES. ([id number]) being said the wound itself overall does not appear to be too significant or worse. There is no event noted in the wound bed which is good news. With that being said the patient has been worried as the home health nurse stated that she thought she saw bone in the base of the wound. Nonetheless there's a little bit of bruising along the left lateral border of the wound bed which again does not appear to be terrible we have noticed this before but other than this the majority the wound bed appears to be doing excellent in my pinion. I am wondering if there may be some bacterial colonization. This could be leading to some of the odor that we are noting with the Wound VAC. 04/01/18 on evaluation today patient appears to actually be doing well in regard to the overall appearance of the wound itself. She also does not have any odor at this point in regard to the wound surface and I do believe the Bacons is extremely helpful in this regard. With that being said she does have a little bit of deep tissue injury on the medial aspect of the wound. Again last time it was more lateral. The lateral seems to have improved quite well. Nonetheless she also notes by way of her son that this has been very macerated in the past several weeks since I last saw her. Though things overall seem to be doing better I still think that the Wound VAC may help with managing her fluid better I'm wondering if we can attempt a gauze Wound VAC my hope is that this will be able to manage the moisture control as well as continuing with managing the odor of the wound and bacterial load since the Dakin s has done excellent in helping in that regard. 04/15/17 on evaluation today patient actually appears to be doing much better in  regard to the overall appearance of her sacral wound. I do believe that the gauze Vac has been of benefit for her. Overall she is tolerating this without complication which  is also good news. There is no sign of injury to the sacral region at this point. 04/29/18 on evaluation today patient appears to be doing well in regard to her sacral ulcer. She sure signs of good granulation and the Gauze VAC is doing very well for her. Overall I'm pleased in this regard. The biggest issue is that the time from Friday till Monday seems to be quite long for her as far as the amount of time to keep the Wound VAC sealed. For that reason her son wonders if he can take this off on Sunday morning in order to give her day of just wet to dry dressing's with the Dakin's solution I think this would be okay. 05/13/18 on evaluation today patient appears to be doing okay in regard to her sacral ulcer. There does not appear to be any evidence of overt infection at this time which is good news. In general the drainage and moisture appears to be significantly improved this was after the pressure on the Wound VAC was increased after they contacted our office. With that being said a lot of the dark tissue around the edge of the wound has improved in the maceration is significantly better this seems to be managing much better. Fortunately there is no signs of infection at this time. Overall the patient is doing quite well. The wound measurements are not significantly smaller today although they were over the past two visits we will see how things go. 05/27/18 on evaluation today patient's sacral wound actually appears to be doing fairly well at this point. She does have a deep tissue injury yet again on the medial aspect of the wound which is something that we seem to be seeing often. There is some question as to whether or not this is actually being caused by the Wound VAC bunching up and then her somewhat laying on it even though they are attempting to offload this as best as possible. Patient son states that overall they do the best they can trying offload her and I definitely believe him. I may  want to see about putting the Wound VAC on hold for a couple weeks and see if this happens just with standard dressing changes. 06/10/18 on evaluation today patient actually appears to be doing better in regard to her sacral wound. Overall there's no signs of infection, no odor, and no significant skin breakdown around the edge of the wound I feel like this is done much better 50 Dakin s soaked gauze dressing as opposed to legalizing anything such as the Wound VAC. I believe that we discontinue the Wound VAC as of today. 06/24/18 on evaluation today patient actually appears to be doing well in regard to her sacral ulcer. There's no signs of infection, no odor, and in my pinion no concerns at all. I feel like she is making good progress as far as I'm concerned. 07/12/18 on evaluation today patient appears to be doing very well in regard to her sacral ulcer. She has been tolerating the dressing changes without complication. Fortunately there does not appear to be any evidence of active infection at this time she is doing excellent as far as I'm concerned at this point. 08/11/18 on evaluation today patient's sacral wound actually appears to be doing quite well at this point. The  main issue I see is that she continues to have some maceration and skin breakdown due to moisture over the right periwound location unfortunately. We've investigated the possibility of using zinc oxide before but have not utilized it simply due to the fact that she continues to state. Her son anyways that she is allergic to this. They have not tried it recently and may be completely unrelated to the reaction she had before. Nonetheless this may be something you want to attempt will discuss that in the plan. 10/04/18 on evaluation today patient actually appears to be doing quite well in regard to her sacral wound. It's been roughly 2 months since I last seen her in that time her wound has definitely made progress. This is very slow  but again nonetheless Jessica [name], Jessica [name]. ([id number]) does seem to be trending in the correct direction. Overall very pleased in this regard. No fevers, chills, nausea, or vomiting noted at this time. 11/07/2018 on evaluation today patient appears to be doing decently well with regard to her sacral wound based on what I am seeing at this point. She is seen by way of a telehealth visit due to the COVID-19 national emergency and to be honest the patient is somewhat reluctant to come out to be seen for visits which I completely understand. They feel much safer keeping her home as much as possible due to her adequate condition in general. The only issue that she has had recently she did have some wheezing and subsequently chest congestion she was just placed on albuterol along with prednisone at this point. She seems to be doing much better even after 24 hours of the prednisone according to her [name]. Overall they are pleased with how things seem to be progressing. 11/28/2018 upon evaluation today patient is seen by way of telehealth visit since she is still somewhat reluctant to come out due to the COVID-19 pandemic. Subsequently she seems to be doing well and infected I could not obtain actual measurements for the wound the overall appearance of the wound is dramatically improved. She does not appear to have nearly as much space at the 12:00 location and in fact her [name] who is taking care of her mainly states that he is barely able to get anything up into that region is filled and so nicely. Overall I feel like she is making great progress. 12/12/2018 patient is seen today via a telehealth visit again secondary to her concerns about coming out during the COVID-19 national pandemic. Nonetheless she appears to be doing quite well at this time which is good news. There is no signs of active infection at this time. Overall patient seems to be very pleased as is her son who takes very good care of  her. 01/16/2019 on evaluation today patient actually appears to be doing very well with regard to her wound. We have been doing zoom calls quite a bit since the COVID-19 pandemic began just due to the fact that she has been somewhat concerned about coming out. With that being said she seems to have done very well and in fact the wound is measuring quite a bit smaller today compared to the last measurement. Fortunately there is no signs of active infection at this time. She is also having great improvement overall in her demeanor she is very upbeat and pleased with how things have progressed. Patient History Information obtained from Patient. Family History No family history of Cancer, Diabetes, Heart Disease. Social History Never smoker,  Marital Status - Married. Review of Systems (ROS) Constitutional Symptoms (General Health) Denies complaints or symptoms of Fatigue, Fever, Chills, Marked Weight Change. Respiratory Denies complaints or symptoms of Chronic or frequent coughs, Shortness of Breath. Cardiovascular Denies complaints or symptoms of Chest pain, LE edema. Psychiatric Denies complaints or symptoms of Anxiety, Claustrophobia. Objective Constitutional Well-nourished and well-hydrated in no acute distress. Jessica [name], Jessica [name] ([id number]) Vitals Time Taken: 10:21 AM, Height: 70 in, Temperature: 98.9 F, Pulse: 71 bpm, Respiratory Rate: 16 breaths/min, Blood Pressure: 157/60 mmHg. Respiratory normal breathing without difficulty. clear to auscultation bilaterally. Cardiovascular regular rate and rhythm with normal S1, S2. Psychiatric this patient is able to make decisions and demonstrates good insight into disease process. Alert and Oriented x 3. pleasant and cooperative. General Notes: Patient's wound bed currently showed signs of excellent granulation there does not appear to be any bruising nor deep tissue injury noted at this point everything seems to be going excellent as  far as I am concerned. There is no signs of active infection and overall I feel like they have a good [name] plan going at this time. The key is good to be just keeping things along this line in order. Integumentary (Hair, Skin) Wound #1 status is Open. Original cause of wound was Pressure Injury. The wound is located on the Medial Sacrum. The wound measures 6cm length x 4.1cm width x 2.1cm depth; 19.321cm^2 area and 40.574cm^3 volume. There is muscle, Fat Layer (Subcutaneous Tissue) Exposed, and fascia exposed. There is no tunneling or undermining noted. There is a medium amount of sanguinous drainage noted. Foul odor after cleansing was noted. The wound margin is distinct with the outline attached to the wound base. There is large (67-100%) red, pink, hyper - granulation within the wound bed. There is a small (1-33%) amount of necrotic tissue within the wound bed including Adherent Slough. 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 Skin Barriers/Peri-Wound Care: Jessica [name], Jessica [name] ([id number]) Wound #1 Medial Sacrum: Skin Prep Primary Wound Dressing: Wound #1 Medial Sacrum: Pack wound with: - Dakin's soaked gauze Secondary Dressing: Wound #1 Medial Sacrum: ABD pad - secure with tape Dressing Change Frequency: Wound #1 Medial Sacrum: Change dressing twice daily. Follow-up Appointments: Wound #1 Medial Sacrum: Return Appointment in 1 month Off-Loading: Wound #1 Medial Sacrum: Turn and reposition every 2  hours Additional Orders / Instructions: Wound #1 Medial Sacrum: Increase protein intake. Home Health: Wound #1 Medial Sacrum: Continue Home Health Visits - [facility] Nurse may visit PRN to address patient s wound care needs. FACE TO FACE ENCOUNTER: MEDICARE and MEDICAID PATIENTS: I certify that this patient is under my care and that I had a face-to-face encounter that meets the physician face-to-face encounter requirements with this patient on this date. The encounter with the patient was in whole or in part for the following MEDICAL CONDITION: (primary reason for Home Healthcare) MEDICAL NECESSITY: I certify, that based on my findings, NURSING services are a medically necessary home health service. HOME BOUND STATUS: I certify that my clinical findings support that this patient is homebound (i.e., Due to  illness or injury, pt requires aid of supportive devices such as crutches, cane, wheelchairs, walkers, the use of special transportation or the assistance of another person to leave their place of residence. There is a normal inability to leave the home and doing so requires considerable and taxing effort. Other absences are for medical reasons / religious services and are infrequent or of short duration when for other reasons). If current dressing causes regression in wound condition, may D/C ordered dressing product/s and apply Normal Saline Moist Dressing daily until next Wound Healing Center / Other MD appointment. Notify Wound Healing Center of regression in wound condition at [phone]. Please direct any NON-WOUND related issues/requests for orders to patient's Primary Care Physician 1 I would recommend currently that we go ahead and continue with the Dakin soaked gauze dressing as I feel like this is doing excellent for her. 2. I am also going to suggest that we go ahead and cover the area with ABD pad secured with tape that seems to be doing the best and does seem to  catch the drainage appropriately. 3. Repositioning is still critical which I have discussed with him in the past and they seem to do a great job with this as well. We will see patient back for reevaluation in 4 weeks here in the clinic. If anything worsens or changes patient will contact our office for additional recommendations. Electronic Signature(s) Signed: 01/16/2019 10:41:25 AM By: Lenda Kelp PA-C Entered By: Lenda Kelp on 01/16/2019 10:41:25 Jessica [name] ([id number]) -------------------------------------------------------------------------------- ROS/PFSH Details [name]. Date of Service: 01/16/2019 10:45 AM Medical Record Number: [id number] Patient Account Number: [id number] Date of Birth/Sex: 07/16/1945 (73 y.o. F) Treating RN: Huel Coventry Primary Care Provider: Annita Brod Other Clinician: Referring Provider: Annita Brod Treating Provider/Extender: STONE III, Fartun Paradiso Weeks in [name]: 41 Information Obtained From Patient Constitutional Symptoms (General Health) Complaints and Symptoms: Negative for: Fatigue; Fever; Chills; Marked Weight Change Respiratory Complaints and Symptoms: Negative for: Chronic or frequent coughs; Shortness of Breath Cardiovascular Complaints and Symptoms: Negative for: Chest pain; LE edema Psychiatric Complaints and Symptoms: Negative for: Anxiety; Claustrophobia Immunizations Pneumococcal Vaccine: Received Pneumococcal Vaccination: No Implantable Devices No devices added Family and Social History Cancer: No; Diabetes: No; Heart Disease: No; Never smoker; Marital Status - Married Physician Affirmation I have reviewed and agree with the above information. Electronic Signature(s) Signed: 01/16/2019 4:22:49 PM By: Lenda Kelp PA-C Signed: 01/16/2019 4:49:27 PM By: Elliot Gurney BSN, RN, CWS, Kim RN, BSN Entered By: Lenda Kelp on 01/16/2019 10:40:44 Jessica [name]  ([id number]) -------------------------------------------------------------------------------- SuperBill Details [name]. Date of Service: 01/16/2019 Medical Record Number: [id number] Patient Account Number: [id number] Date of Birth/Sex: [birth year] (73 y.o. F) Treating RN: Huel Coventry Primary Care Provider: Annita Brod Other Clinician: Referring Provider: Annita Brod Treating Provider/Extender: Linwood Dibbles, Cerina Leary Weeks in [name]: 85 Diagnosis Coding ICD-10 Codes Code Description L89.154 Pressure ulcer of sacral region, stage 4 M48.00 Spinal stenosis, site unspecified R54 Age-related physical debility G89.4 Chronic pain syndrome E78.2 Mixed hyperlipidemia L89.93 Pressure ulcer of unspecified site, stage 3 Facility Procedures CPT4 Code: [id number] Description: 99213 - WOUND CARE VISIT-LEV 3 EST PT Modifier: Quantity: 1 Physician Procedures CPT4 Code: [id number] Description: 99214 - WC PHYS LEVEL 4 - EST PT ICD-10 Diagnosis Description L89.154 Pressure ulcer of sacral region, stage 4 M48.00 Spinal stenosis, site unspecified R54 Age-related physical debility G89.4 Chronic pain syndrome Modifier: Quantity: 1 Electronic Signature(s) Signed: 01/16/2019 10:41:38 AM By: Lenda Kelp PA-C  Entered By: Lenda Kelp on 01/16/2019 10:41:38

## 2019-01-16 NOTE — Progress Notes (Signed)
[name], [name] ([id number]) Visit Report for 01/16/2019 Arrival Information Details [name]. Date of Service: 01/16/2019 10:45 AM Medical Record Number: [id number] Patient Account Number: [id number] Date of Birth/Sex: 1945-05-01 (73 y.o. F) Treating RN: Curtis Sites Primary Care Dellanira Dillow: Annita Brod Other Clinician: Referring Tessah Patchen: Annita Brod Treating Arnetra Terris/Extender: Linwood Dibbles, HOYT Weeks in Treatment: 85 Visit Information History Since Last Visit Added or deleted any medications: No Patient Arrived: Stretcher Any new allergies or adverse reactions: No Arrival Time: 10:20 Had a fall or experienced change in No Accompanied By: son activities of daily living that may affect Transfer Assistance: Stretcher risk of falls: Patient Identification Verified: Yes Signs or symptoms of abuse/neglect since last visito No Secondary Verification Process Yes Hospitalized since last visit: No Completed: Implantable device outside of the clinic excluding No Patient Has Alerts: Yes cellular tissue based products placed in the center Patient Alerts: ALLERGIC TO since last visit: ZINC Has Dressing in Place as Prescribed: Yes Pain Present Now: No Electronic Signature(s) Signed: 01/16/2019 3:56:20 PM By: Curtis Sites Entered By: Curtis Sites on 01/16/2019 10:21:04 Jessica [name] ([id number]) -------------------------------------------------------------------------------- Clinic Level of Care Assessment Details [name]. Date of Service: 01/16/2019 10:45 AM Medical Record Number: [id number] Patient Account Number: [id number] Date of Birth/Sex: 1945-10-19 (73 y.o. F) Treating RN: Huel Coventry Primary Care Kirstyn Lean: Annita Brod Other Clinician: Referring Chantilly Linskey: Annita Brod Treating Jassen Sarver/Extender: Linwood Dibbles, HOYT Weeks in Treatment: 85 Clinic Level of Care Assessment Items TOOL 4 Quantity Score  - Use when  only an EandM is performed on FOLLOW-UP visit 0 ASSESSMENTS - Nursing Assessment / Reassessment X - Reassessment of Co-morbidities (includes updates in patient status) 1 10 X- 1 5 Reassessment of Adherence to Treatment Plan ASSESSMENTS - Wound and Skin Assessment / Reassessment X - Simple Wound Assessment / Reassessment - one wound 1 5  - 0 Complex Wound Assessment / Reassessment - multiple wounds  - 0 Dermatologic / Skin Assessment (not related to wound area) ASSESSMENTS - Focused Assessment  - Circumferential Edema Measurements - multi extremities 0  - 0 Nutritional Assessment / Counseling / Intervention  - 0 Lower Extremity Assessment (monofilament, tuning fork, pulses)  - 0 Peripheral Arterial Disease Assessment (using hand held doppler) ASSESSMENTS - Ostomy and/or Continence Assessment and Care  - Incontinence Assessment and Management 0  - 0 Ostomy Care Assessment and Management (repouching, etc.) PROCESS - Coordination of Care X - Simple Patient / Family Education for ongoing care 1 15  - 0 Complex (extensive) Patient / Family Education for ongoing care  - 0 Staff obtains Chiropractor, Records, Test Results / Process Orders  - 0 Staff telephones HHA, Nursing Homes / Clarify orders / etc  - 0 Routine Transfer to another Facility (non-emergent condition)  - 0 Routine Hospital Admission (non-emergent condition)  - 0 New Admissions / Manufacturing engineer / Ordering NPWT, Apligraf, etc.  - 0 Emergency Hospital Admission (emergent condition) X- 1 10 Simple Discharge Coordination [name], [name]. ([id number])  - 0 Complex (extensive) Discharge Coordination PROCESS - Special Needs  - Pediatric / Minor Patient Management 0  - 0 Isolation Patient Management  - 0 Hearing / Language / Visual special needs  - 0 Assessment of Community assistance (transportation, D/C planning, etc.)  - 0 Additional assistance / Altered  mentation  - 0 Support Surface(s) Assessment (bed, cushion, seat, etc.) INTERVENTIONS - Wound Cleansing / Measurement X - Simple Wound Cleansing - one wound 1 5  - 0 Complex Wound  Cleansing - multiple wounds X- 1 5 Wound Imaging (photographs - any number of wounds) []  - 0 Wound Tracing (instead of photographs) X- 1 5 Simple Wound Measurement - one wound []  - 0 Complex Wound Measurement - multiple wounds INTERVENTIONS - Wound Dressings []  - Small Wound Dressing one or multiple wounds 0 X- 1 15 Medium Wound Dressing one or multiple wounds []  - 0 Large Wound Dressing one or multiple wounds []  - 0 Application of Medications - topical []  - 0 Application of Medications - injection INTERVENTIONS - Miscellaneous []  - External ear exam 0 []  - 0 Specimen Collection (cultures, biopsies, blood, body fluids, etc.) []  - 0 Specimen(s) / Culture(s) sent or taken to Lab for analysis []  - 0 Patient Transfer (multiple staff / Civil Service fast streamer / Similar devices) []  - 0 Simple Staple / Suture removal (25 or less) []  - 0 Complex Staple / Suture removal (26 or more) []  - 0 Hypo / Hyperglycemic Management (close monitor of Blood Glucose) []  - 0 Ankle / Brachial Index (ABI) - do not check if billed separately X- 1 5 Vital Signs [name], [name] ([id number]) Has the patient been seen at the hospital within the last three years: Yes Total Score: 80 Level Of Care: New/Established - Level 3 Electronic Signature(s) Signed: 01/16/2019 4:49:27 PM By: Gretta Cool, BSN, RN, CWS, Kim RN, BSN Entered By: Gretta Cool, BSN, RN, CWS, Kim on 01/16/2019 10:35:18 Jessica [name] ([id number]) -------------------------------------------------------------------------------- Encounter Discharge Information Details [name]. Date of Service: 01/16/2019 10:45 AM Medical Record Number: [id number] Patient Account Number: [id number] Date of Birth/Sex: [birth year] (73 y.o. F) Treating RN: Cornell Barman Primary Care Denika Krone: Clovia Cuff Other Clinician: Referring Bartt Gonzaga: Clovia Cuff Treating Jeyda Siebel/Extender: Melburn Hake, HOYT Weeks in Treatment: 48 Encounter Discharge Information Items Discharge Condition: Stable Ambulatory Status: Stretcher Discharge Destination: Home Transportation: Ambulance Accompanied By: son Schedule Follow-up Appointment: Yes Clinical Summary of Care: Electronic Signature(s) Signed: 01/16/2019 4:49:27 PM By: Gretta Cool, BSN, RN, CWS, Kim RN, BSN Entered By: Gretta Cool, BSN, RN, CWS, Kim on 01/16/2019 10:38:51 Jessica [name] ([id number]) -------------------------------------------------------------------------------- Lower Extremity Assessment Details [name]. Date of Service: 01/16/2019 10:45 AM Medical Record Number: [id number] Patient Account Number: [id number] Date of Birth/Sex: 1945/05/07 (73 y.o. F) Treating RN: Montey Hora Primary Care Sahory Nordling: Clovia Cuff Other Clinician: Referring Olanna Percifield: Clovia Cuff Treating Jaishawn Witzke/Extender: Melburn Hake, HOYT Weeks in Treatment: 85 Electronic Signature(s) Signed: 01/16/2019 3:56:20 PM By: Montey Hora Entered By: Montey Hora on 01/16/2019 10:21:46 Jessica [name] ([id number]) -------------------------------------------------------------------------------- Multi Wound Chart Details [name]. Date of Service: 01/16/2019 10:45 AM Medical Record Number: [id number] Patient Account Number: [id number] Date of Birth/Sex: Jul 06, 1945 (73 y.o. F) Treating RN: Cornell Barman Primary Care Boleslaus Holloway: Clovia Cuff Other Clinician: Referring Romelle Muldoon: Clovia Cuff Treating Pharaoh Pio/Extender: Melburn Hake, HOYT Weeks in Treatment: 85 Vital Signs Height(in): 70 Pulse(bpm): 71 Weight(lbs): Blood Pressure(mmHg): 157/60 Body Mass Index(BMI): Temperature(F): 98.9 Respiratory Rate 16 (breaths/min): Photos: [N/A:N/A] Wound Location: Sacrum - Medial N/A  N/A Wounding Event: Pressure Injury N/A N/A Primary Etiology: Pressure Ulcer N/A N/A Date Acquired: 01/15/2017 N/A N/A Weeks of Treatment: 85 N/A N/A Wound Status: Open N/A N/A Measurements L x W x D 6x4.1x2.1 N/A N/A (cm) Area (cm) : 19.321 N/A N/A Volume (cm) : 40.574 N/A N/A % Reduction in Area: 47.60% N/A N/A % Reduction in Volume: -10.10% N/A N/A Classification: Category/Stage IV N/A N/A Exudate Amount: Medium N/A N/A Exudate Type: Sanguinous N/A N/A Exudate Color: red N/A N/A  Foul Odor After Cleansing: Yes N/A N/A Odor Anticipated Due to No N/A N/A Product Use: Wound Margin: Distinct, outline attached N/A N/A Granulation Amount: Large (67-100%) N/A N/A Granulation Quality: Red, Pink, Hyper-granulation N/A N/A Necrotic Amount: Small (1-33%) N/A N/A Exposed Structures: Fascia: Yes N/A N/A Fat Layer (Subcutaneous Tissue) Exposed: Yes Muscle: Yes Tendon: No Jessica BradyVERMAN, Jeweldean W. ([id number]) Joint: No Bone: No Epithelialization: None N/A N/A Treatment Notes Electronic Signature(s) Signed: 01/16/2019 4:49:27 PM By: Elliot GurneyWoody, BSN, RN, CWS, Kim RN, BSN Entered By: Elliot GurneyWoody, BSN, RN, CWS, Kim on 01/16/2019 10:33:46 Jessica BradyVERMAN, Stefanie W. ([id number]) -------------------------------------------------------------------------------- Multi-Disciplinary Care Plan Details Patient Name: Jessica BradyVERMAN, Kenzlie W. Date of Service: 01/16/2019 10:45 AM Medical Record Number: [id number] Patient Account Number: [id number] Date of Birth/Sex: [birth year](73 y.o. F) Treating RN: Huel CoventryWoody, Kim Primary Care Tanieka Pownall: Annita BrodASENSO, PHILIP Other Clinician: Referring Sereen Schaff: Annita BrodASENSO, PHILIP Treating Marcia Hartwell/Extender: Linwood DibblesSTONE III, HOYT Weeks in Treatment: 4585 Active Inactive Orientation to the Wound Care Program Nursing Diagnoses: Knowledge deficit related to the wound healing center program Goals: Patient/caregiver will verbalize understanding of the Wound Healing Center Program Date Initiated: 06/03/2017 Target  Resolution Date: 06/17/2017 Goal Status: Active Interventions: Provide education on orientation to the wound center Notes: Pressure Nursing Diagnoses: Knowledge deficit related to causes and risk factors for pressure ulcer development Knowledge deficit related to management of pressures ulcers Potential for impaired tissue integrity related to pressure, friction, moisture, and shear Goals: Patient will remain free from development of additional pressure ulcers Date Initiated: 06/03/2017 Target Resolution Date: 06/17/2017 Goal Status: Active Patient will remain free of pressure ulcers Date Initiated: 06/03/2017 Target Resolution Date: 06/17/2017 Goal Status: Active Patient/caregiver will verbalize risk factors for pressure ulcer development Date Initiated: 06/03/2017 Target Resolution Date: 06/17/2017 Goal Status: Active Interventions: Assess: immobility, friction, shearing, incontinence upon admission and as needed Assess potential for pressure ulcer upon admission and as needed Notes: Wound/Skin Impairment Jessica BradyOVERMAN, Britanni W. ([id number]) Nursing Diagnoses: Impaired tissue integrity Goals: Patient/caregiver will verbalize understanding of skin care regimen Date Initiated: 06/03/2017 Target Resolution Date: 06/17/2017 Goal Status: Active Ulcer/skin breakdown will have a volume reduction of 30% by week 4 Date Initiated: 06/03/2017 Target Resolution Date: 06/17/2017 Goal Status: Active Interventions: Assess ulceration(s) every visit Treatment Activities: Patient referred to home care : 06/03/2017 Skin care regimen initiated : 06/03/2017 Notes: Electronic Signature(s) Signed: 01/16/2019 4:49:27 PM By: Elliot GurneyWoody, BSN, RN, CWS, Kim RN, BSN Entered By: Elliot GurneyWoody, BSN, RN, CWS, Kim on 01/16/2019 10:33:33 Jessica BradyVERMAN, Sama W. ([id number]) -------------------------------------------------------------------------------- Pain Assessment Details Patient Name: Jessica BradyVERMAN, Kameko W. Date of Service: 01/16/2019  10:45 AM Medical Record Number: [id number] Patient Account Number: [id number] Date of Birth/Sex: [birth year](73 y.o. F) Treating RN: Curtis Sitesorthy, Joanna Primary Care Kaiana Marion: Annita BrodASENSO, PHILIP Other Clinician: Referring Corianna Avallone: Annita BrodASENSO, PHILIP Treating Jyair Kiraly/Extender: STONE III, HOYT Weeks in Treatment: 3485 Active Problems Location of Pain Severity and Description of Pain Patient Has Paino No Site Locations Pain Management and Medication Current Pain Management: Electronic Signature(s) Signed: 01/16/2019 3:56:20 PM By: Curtis Sitesorthy, Joanna Entered By: Curtis Sitesorthy, Joanna on 01/16/2019 10:21:11 Jessica BradyVERMAN, Kaislyn W. ([id number]) -------------------------------------------------------------------------------- Patient/Caregiver Education Details Patient Name: Jessica BradyVERMAN, Jamoni W. Date of Service: 01/16/2019 10:45 AM Medical Record Number: [id number] Patient Account Number: [id number] Date of Birth/Gender: [birth year](73 y.o. F) Treating RN: Huel CoventryWoody, Kim Primary Care Physician: Annita BrodASENSO, PHILIP Other Clinician: Referring Physician: Annita BrodASENSO, PHILIP Treating Physician/Extender: Linwood DibblesSTONE III, HOYT Weeks in Treatment: 685 Education Assessment Education Provided To: Patient Education Topics Provided Pressure: Handouts: Pressure Ulcers: Care and Offloading, Preventing Pressure Ulcers Methods: Demonstration, Explain/Verbal Responses: Return demonstration correctly Electronic  Signature(s) Signed: 01/16/2019 4:49:27 PM By: Elliot Gurney, BSN, RN, CWS, Kim RN, BSN Entered By: Elliot Gurney, BSN, RN, CWS, Kim on 01/16/2019 10:35:41 Jessica [name] ([id number]) -------------------------------------------------------------------------------- Wound Assessment Details [name]. Date of Service: 01/16/2019 10:45 AM Medical Record Number: [id number] Patient Account Number: [id number] Date of Birth/Sex: March 24, 1946 (74 y.o. F) Treating RN: Curtis Sites Primary Care Orville Widmann: Annita Brod Other  Clinician: Referring Nova Evett: Annita Brod Treating Sayward Horvath/Extender: STONE III, HOYT Weeks in Treatment: 85 Wound Status Wound Number: 1 Primary Etiology: Pressure Ulcer Wound Location: Sacrum - Medial Wound Status: Open Wounding Event: Pressure Injury Date Acquired: 01/15/2017 Weeks Of Treatment: 85 Clustered Wound: No Photos Wound Measurements Length: (cm) 6 Width: (cm) 4.1 Depth: (cm) 2.1 Area: (cm) 19.321 Volume: (cm) 40.574 % Reduction in Area: 47.6% % Reduction in Volume: -10.1% Epithelialization: None Tunneling: No Undermining: No Wound Description Classification: Category/Stage IV Wound Margin: Distinct, outline attached Exudate Amount: Medium Exudate Type: Sanguinous Exudate Color: red Foul Odor After Cleansing: Yes Due to Product Use: No Slough/Fibrino Yes Wound Bed Granulation Amount: Large (67-100%) Exposed Structure Granulation Quality: Red, Pink, Hyper-granulation Fascia Exposed: Yes Necrotic Amount: Small (1-33%) Fat Layer (Subcutaneous Tissue) Exposed: Yes Necrotic Quality: Adherent Slough Tendon Exposed: No Muscle Exposed: Yes Necrosis of Muscle: No Joint Exposed: No Bone Exposed: No Treatment Notes OREL, HORD. ([id number]) Wound #1 (Medial Sacrum) Notes Dakins soaked gauze packed into wound, BFD Electronic Signature(s) Signed: 01/16/2019 3:56:20 PM By: Curtis Sites Entered By: Curtis Sites on 01/16/2019 10:30:00 Jessica [name] ([id number]) -------------------------------------------------------------------------------- Vitals Details [name]. Date of Service: 01/16/2019 10:45 AM Medical Record Number: [id number] Patient Account Number: [id number] Date of Birth/Sex: 1946-03-15 (73 y.o. F) Treating RN: Curtis Sites Primary Care Yarelin Reichardt: Annita Brod Other Clinician: Referring Marcelle Hepner: Annita Brod Treating Jawana Reagor/Extender: STONE III, HOYT Weeks in Treatment: 85 Vital Signs Time  Taken: 10:21 Temperature (F): 98.9 Height (in): 70 Pulse (bpm): 71 Respiratory Rate (breaths/min): 16 Blood Pressure (mmHg): 157/60 Reference Range: 80 - 120 mg / dl Electronic Signature(s) Signed: 01/16/2019 3:56:20 PM By: Curtis Sites Entered By: Curtis Sites on 01/16/2019 10:21:32

## 2019-02-13 ENCOUNTER — Ambulatory Visit: Payer: Medicare Other | Admitting: Physician Assistant

## 2019-02-21 ENCOUNTER — Other Ambulatory Visit: Payer: Self-pay

## 2019-02-21 ENCOUNTER — Encounter: Payer: Medicare Other | Attending: Physician Assistant | Admitting: Physician Assistant

## 2019-02-21 DIAGNOSIS — L89154 Pressure ulcer of sacral region, stage 4: Secondary | ICD-10-CM | POA: Insufficient documentation

## 2019-02-21 NOTE — Progress Notes (Addendum)
KACHINA, NIEDERER ([id number]) Visit Report for 02/21/2019 Chief Complaint Document Details [name], Jessica [name]. Date of Service: 02/21/2019 11:00 AM Medical Record Number: [id number] Patient Account Number: [id number] Date of Birth/Sex: 17-Jun-1945 (73 y.o. F) Treating RN: Primary Care Provider: Annita Brod Other Clinician: Referring Provider: Annita Brod Treating Provider/Extender: STONE III, HOYT Weeks in Treatment: 90 Information Obtained from: Patient Chief Complaint she is here for evaluation of a sacral ulcer Electronic Signature(s) Signed: 02/21/2019 11:02:27 AM By: Lenda Kelp PA-C Entered By: Lenda Kelp on 02/21/2019 11:02:27 Jessica [name] ([id number]) -------------------------------------------------------------------------------- HPI Details [name]. Date of Service: 02/21/2019 11:00 AM Medical Record Number: [id number] Patient Account Number: [id number] Date of Birth/Sex: 28-Aug-1945 (73 y.o. F) Treating RN: Primary Care Provider: Annita Brod Other Clinician: Referring Provider: Annita Brod Treating Provider/Extender: Linwood Dibbles, HOYT Weeks in Treatment: 90 History of Present Illness HPI Description: 05/27/17-she is here in initial evaluation for a left-sided sacral stage IV pressure ulcer and bilateral lower extremity, lateral aspect, unstageable pressure ulcers. She is accompanied by her husband and her son, who are her primary caregivers. She is bedbound secondary to spinal stenosis. According to her son and husband she was hospitalized from 10/5-11/1 with healthcare associated pneumonia and altered mental status. During her hospitalization she was intubated, extubated on 10/27. She was discharged with Foley catheter and follow up with urology. According to her son and spouse she developed the sacral ulcer during hospitalization. Home health has been applying Santyl daily. She does have a low air loss mattress  and is repositioned every 2-3 hours per her family's report. According to the son and spouse she had an appointment with urology on 12/18 and during that appointment developed discoloration to her bilateral lower extremities which ultimately developed into unstageable pressure ulcers to the lateral aspects of her bilateral lower extremities. There has been no topical treatment applied to these. She continues to have home health. There is no concerns expressed regarding dietary intake, stating she eats 3 meals a day, eating was provided; she is supplemented with boost with protein. 06/03/17-she is here in follow-up evaluation for sacral and bilateral lower extremity ulcers. Plain film x-ray done today reveals no distraction to the sacrum or coccyx, no visible abnormalities. Home health has ordered the negative pressure wound system but it has not been initiated. We will continue with Hydrofera Blue until initiation of negative pressure wound system and continue with Santyl to bilateral lower extremity ulcers. Follow-up next week 06/10/17-she is here in follow-up evaluation for sacral and bilateral lower extremity ulcers. The wound VAC will be available tomorrow per home health. We will initiate wound VAC therapy to the sacral ulcer 3 times weekly (Thursday, Saturday/Sunday, Tuesday). We will continue with Santyl to the lower extremity ulcers. The patient's son is checking into home health therapy over the weekend for [facility] changes, with the understanding that if VAC changes cannot be performed over the weekend he will need to change his mother's appointments to Monday, Wednesday or Friday. The sacral ulcer clinically does not appear infected but there has been a change in the amount of drainage acutely, there is no significant amount of devitalized tissue, there is no malodor. Wound culture was obtained to evaluate for occult infection we will hold off on antibiotic therapy until sensitivities are  resulted. 06/18/17 on evaluation today patient appears to be doing fairly well in my opinion although this is the first time I have seen this patient she has been previously evaluated  by Tacey Ruiz here in our office. She is going to be switching to Fridays to see me due to the Wound VAC schedule being changed on Monday, Wednesday, and Friday. Subsequently she seems to be doing fairly well with the Wound VAC. Her son who was present during evaluation today states that he somewhat stresses of the Wound VAC in making sure that it was functioning appropriately. With that being said everything seems to be working well he knows what settings on the [facility] itself to look at and ensure that it is functioning properly. Overall the wound appears to be nice and clean there is no need for debridement today. She has no discomfort in her bilateral lower extremity ulcers also appear to be improving based on measurements and what this honest tell me about the overall appearance. 07/02/17 on evaluation today I noted in patients wound bed that was actually an odor that had not previously been noted. Subsequently there was also a small area of bone that was not previously noted during my last evaluation. This did appear to be necrotic and was being somewhat forced out by the body around the region of granulation. She does not have any pain which is good news. Nonetheless the overall appearance of the ulcer is making me concerned for the patient having developed osteomyelitis. Currently she is not been on any antibiotics and the Wound VAC has been doing fairly well in general. With that being said I do not think we need to continue the Wound VAC if she potentially has a bone infection. At least not until it is properly addressed with antibiotics. 07/09/17 on evaluation today patient appears to actually be doing very well in regard to her sacral ulcer compared to last week. There is actually no exposed bone at this point. Her  pathology report showed early signs of osteomyelitis which had explained to the patient son is definitely good news catching this early is often a key to getting it better without things worsening. With that being said still I do believe she needs to have a referral to infectious disease due to the osteomyelitis I am gonna recommend that she continue with the doxycycline based on the culture results which showed Eikenella Corrodens as the Jessica [name], Jessica [name]. ([id number]) organism identified that tetracycline should work for. She does not seem to be having any discomfort whatsoever at this point. 07/16/17 on evaluation today patient actually appears to be doing rather well in regard to her sacral wound. I think that the original wound site actually appears much better than previously noted. With that being said she does have a new superficial injury on the right sacral region which appears to be due to according to the sign transport that occurred for her MRI unfortunately. Fortunately this is not too deep and I do think it can be managed but it was a new area that was not previously noted. Otherwise she has been tolerating the Dakinos soaked gauze packing without complication. 07/30/17 on evaluation today patient presents for reevaluation concerning her sacral wound. She has been tolerating the dressing changes without complication. With that being said her wound is doing so well at this point that I think she may be at the point where we could reinitiate the Wound VAC currently and hopefully see good results from this. I'm very pleased with how she has responded to the Dakinos soaked gauze packing. 08/13/17 evaluation today patient appears to be doing excellent in regard to her lower extremity ulcer this seems to  be cleaning up very nicely. In regard to the sacral ulcer the area of trauma on the right lateral portion of the wound actually appears to be much better than previously noted during the last  evaluation. The word has definitely filled in and the Wound VAC appears to be helpful I do believe. Overall I'm very pleased with how things seem to be progressing at this time. Patient likewise is also happy that things are doing well. 08/27/17 on evaluation today patient presents for follow-up concerning her ongoing issues with her sacral ulcer and lower extremity ulcer. Fortunately she has been tolerating the dressing changes well complication the Wound VAC in general seems to have done very well up to this point. She does not have any evidence of infection which is good news. She does have some dark discoloration in central portion of the wound which is troublesome for the possibility of pressure injury to the site it sounds like her prior air mattress was not functioning properly her son has just bought a new one for her this is doing much better. Otherwise things seem to be progressing nicely. 09/10/17 on evaluation today patient presents for follow-up concerning her sacral wound and lower extremity ulcer. She has been tolerating the switch of her dressing changes to the Dakinos soaked gauze dressing very well in regard to the sacrum. The left lateral lower extremity ulcer appears to have a new area open just distal to the one that we have been treating with Santyl and this is new since her last visit. There does not appear to be any evidence of infection which is good news. With that being said there's a lot of maceration here at the site and I feel this is likely due to the switch and the dressings it appears that due to the sticking of the dressings it will switch to utilizing a telfa pad over the Santyl. I think this caused a lot of drainage to be collected and situated in the region just under the dressing which has led to this causing which duration of breakdown. Overall there does not appear to be any significant pain which is good news. Of note I did actually have a conversation with the  radiologist who is an interventional radiologist with [facility]. 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 With that being said there does not appear to be any rush to get this done according to the interventional radiologist whom I spoke with. Therefore after discussion it was determined that there gonna wait about six months prior to considering the procedure to get time for hopefully the sacral wound to heal in the interim to at least some degree. 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 According to nursing staff these are all improved, the negative pressure wound therapy system was put on hold last week. We will resume negative pressure wound therapy today, continue with Santyl to the lower extremity and she will follow-up next week. 09/24/17 on evaluation today unfortunately the patient's wound appears to be doing significantly worse compared to last week's evaluation and even my valuation the week before. She actually has bone noted on the left wound margin in the region of undermining unfortunately. This was not noted during the last evaluation. I am concerned about the fact that this seems to be worse not better since our last visit with her. My biggest concern is  that she's likely developing a worsening osteomyelitis. This was discussed with patient and her son today during the office visit. 05/03/17 on evaluation today patient actually appears to be doing rather well in regard to her sacral ulcer compared to last week's evaluation. She has been tolerating the dressing changes without complication. Fortunately even though we're not utilizing the Wound VAC she seems to be making some strides in seeing the wound area overall improved quite dramatically in my pinion in just one weeks time. She also seems to be staying off of this to the point that I do not see any  evidence of new injury which is excellent news. Whatever she's been doing in that regard over the past week I would want her to continue. I did also review the results of her bone culture which revealed group B strep as the responsible organism. Again this is what's causing her osteomyelitis she does have infectious disease appointment scheduled for 10/11/17 10/08/1898 valuation today patient appears to be doing better for the most part in regard to the sacral wound. Overall she is showing signs of having granulation of the bone which is good news. There is an area towards the left of the wound where she does seem to be showing some signs of opening this it would be due to additional pressure to the area unfortunately. Jessica [name], Jessica [name]. ([id number]) There does not appear to be any evidence of infection spreading which is good news that do not see any evidence of significant purulent discharge which is also good news. 10/26/17 on evaluation today patient sacral ulcer actually appears to be very healthy and doing well in regard to the overall appearance of the wound. With that being said she does seem to be having some issues at this point in time with some shear/friction injury of the sacrum from where she was transported to [facility] for her infectious disease appointment. She has been placed on IV antibiotic therapy I will have to go back and find that note for review as I did not have access to that today. Nonetheless I will add the next visit be sure to document the exact antibiotics that she is taking at this point. Nonetheless I do believe the wound is doing better there's no bone exposure nothing that requires debridement in regard to the sacral wound. Likewise her left lateral lower extremity ulcer also appears to be doing significantly better. In general I feel like things are showing signs of improvement all around. 11/12/17 on evaluation today patient appears to be doing rather well in regard  to her sacral wound. In general I do see signs of granulation at this point. Fortunately there does not appear to be any evidence of infection which is good news as well. In general overall happy with the appearance. With that being said I'll be see I would like for this to be progressing more rapidly as with the patient and her son but nonetheless at least things do not appear to be doing worse. Her lower extremity ulcer is doing significantly better. 11/26/17 on evaluation today patient appears to be doing a little better in regard to the sacral wound especially in the peripheral of the wound bed. She actually did have an abrasion on the right gluteal region that's completely resolved to this point. Her lower extremity also appears to be for the most part completely resolved. Overall the sacrum itself seems to be the one thing that is still open and given her trouble. No fevers, chills,  nausea, or vomiting noted at this time. She actually has an appointment next week with infectious disease for recheck to see how things are doing. She maybe switch to oral medications at that point. 12/09/17 on evaluation today patient actually appears to be doing much better in regard to her sacral wound. I do feel like she has made good progress at this time as far as healing is concerned. In fact the wound looks better to me today than it has in quite some time. She is on oral antibiotic therapy and no longer on the IV antibiotics. She did see infectious disease last week. 12/24/17 on evaluation today patient's wounds actually appear to be doing decently well in regard to the sacral wound in particular. When it comes to her lower extremity ulcers both appear to be completely healed at this point in the one area where she had lived the rash has completely resolved at this time. Overall very pleased with the progress that has been made. Nonetheless the patient seems to be doing well in general. She still does have  quite a bit of healing to do in regard to the sacral wound but I feel like she is making progress. 01/07/18 on evaluation today patient actually appears to be doing excellent today regard to her sacral wound. The granular quality in the base of the wound is excellent and shown signs of good improvement there's no evidence of contusion nor deep tissue injury and the periwound actually appears to be doing well. In general I'm very pleased with the overall appearance. 02/04/18 on evaluation today patient's wound actually appears to be doing decently well as far as the granulation is concerned. She has been tolerating the dressing changes without complication. Right now we are still using the Wound VAC. Nonetheless overall there apparently is some cater to the wound and this is concerning simply for the fact that she could be developing a soft tissue infection again. She's had this happen in the past and at that time responded very well to the antibiotics. Nonetheless I don't think I would likely put her on anything prophylactically but rather wait for the results of the culture to return. 02/18/18 on evaluation today patient actually appears to be doing fairly well. She has been tolerating the dressing changes. And the Wound VAC seems to be doing well. Overall I'm very pleased with how things have improved over the past couple weeks since I last saw her. Fortunately there does not appear to be any evidence of overt infection at this time which is good news. She has been taking the antibiotics without complication. Specifically that is the Bactrim DS which was prescribed on 02/08/18. 03/18/18 on evaluation today patient's wound actually does have quite a bit of odor noted compared to previous. With that being said the wound itself overall does not appear to be too significant or worse. There is no event noted in the wound bed which is good news. With that being said the patient has been worried as the  home health nurse stated that she thought she saw bone in the base of the wound. Nonetheless there's a little bit of bruising along the left lateral border of the wound bed which again does not appear to be terrible we have noticed this before but other than this the majority the wound bed appears to be doing excellent in my pinion. I am wondering if there may be some bacterial colonization. This could be leading to some of the odor that  we are noting with the Wound VAC. IMANI, FIEBELKORN ([id number]) 04/01/18 on evaluation today patient appears to actually be doing well in regard to the overall appearance of the wound itself. She also does not have any odor at this point in regard to the wound surface and I do believe the Bacons is extremely helpful in this regard. With that being said she does have a little bit of deep tissue injury on the medial aspect of the wound. Again last time it was more lateral. The lateral seems to have improved quite well. Nonetheless she also notes by way of her son that this has been very macerated in the past several weeks since I last saw her. Though things overall seem to be doing better I still think that the Wound VAC may help with managing her fluid better I'm wondering if we can attempt a gauze Wound VAC my hope is that this will be able to manage the moisture control as well as continuing with managing the odor of the wound and bacterial load since the Dakinos has done excellent in helping in that regard. 04/15/17 on evaluation today patient actually appears to be doing much better in regard to the overall appearance of her sacral wound. I do believe that the gauze Vac has been of benefit for her. Overall she is tolerating this without complication which is also good news. There is no sign of injury to the sacral region at this point. 04/29/18 on evaluation today patient appears to be doing well in regard to her sacral ulcer. She sure signs of good  granulation and the Gauze VAC is doing very well for her. Overall I'm pleased in this regard. The biggest issue is that the time from Friday till Monday seems to be quite long for her as far as the amount of time to keep the Wound VAC sealed. For that reason her son wonders if he can take this off on Sunday morning in order to give her day of just wet to dry dressing's with the Dakin's solution I think this would be okay. 05/13/18 on evaluation today patient appears to be doing okay in regard to her sacral ulcer. There does not appear to be any evidence of overt infection at this time which is good news. In general the drainage and moisture appears to be significantly improved this was after the pressure on the Wound VAC was increased after they contacted our office. With that being said a lot of the dark tissue around the edge of the wound has improved in the maceration is significantly better this seems to be managing much better. Fortunately there is no signs of infection at this time. Overall the patient is doing quite well. The wound measurements are not significantly smaller today although they were over the past two visits we will see how things go. 05/27/18 on evaluation today patient's sacral wound actually appears to be doing fairly well at this point. She does have a deep tissue injury yet again on the medial aspect of the wound which is something that we seem to be seeing often. There is some question as to whether or not this is actually being caused by the Wound VAC bunching up and then her somewhat laying on it even though they are attempting to offload this as best as possible. Patient son states that overall they do the best they can trying offload her and I definitely believe him. I may want to see about putting the Wound VAC  on hold for a couple weeks and see if this happens just with standard dressing changes. 06/10/18 on evaluation today patient actually appears to be doing better  in regard to her sacral wound. Overall there's no signs of infection, no odor, and no significant skin breakdown around the edge of the wound I feel like this is done much better 50 Dakinos soaked gauze dressing as opposed to legalizing anything such as the Wound VAC. I believe that we discontinue the Wound VAC as of today. 06/24/18 on evaluation today patient actually appears to be doing well in regard to her sacral ulcer. There's no signs of infection, no odor, and in my pinion no concerns at all. I feel like she is making good progress as far as I'm concerned. 07/12/18 on evaluation today patient appears to be doing very well in regard to her sacral ulcer. She has been tolerating the dressing changes without complication. Fortunately there does not appear to be any evidence of active infection at this time she is doing excellent as far as I'm concerned at this point. 08/11/18 on evaluation today patient's sacral wound actually appears to be doing quite well at this point. The main issue I see is that she continues to have some maceration and skin breakdown due to moisture over the right periwound location unfortunately. We've investigated the possibility of using zinc oxide before but have not utilized it simply due to the fact that she continues to state. Her son anyways that she is allergic to this. They have not tried it recently and may be completely unrelated to the reaction she had before. Nonetheless this may be something you want to attempt will discuss that in the plan. 10/04/18 on evaluation today patient actually appears to be doing quite well in regard to her sacral wound. It's been roughly 2 months since I last seen her in that time her wound has definitely made progress. This is very slow but again nonetheless does seem to be trending in the correct direction. Overall very pleased in this regard. No fevers, chills, nausea, or vomiting noted at this time. 11/07/2018 on evaluation today  patient appears to be doing decently well with regard to her sacral wound based on what I am seeing at this point. She is seen by way of a telehealth visit due to the COVID-19 national emergency and to be honest the patient is somewhat reluctant to come out to be seen for visits which I completely understand. They feel much safer keeping Jessica [name], Jessica [name]. ([id number]) her home as much as possible due to her adequate condition in general. The only issue that she has had recently she did have some wheezing and subsequently chest congestion she was just placed on albuterol along with prednisone at this point. She seems to be doing much better even after 24 hours of the prednisone according to her [name]. Overall they are pleased with how things seem to be progressing. 11/28/2018 upon evaluation today patient is seen by way of telehealth visit since she is still somewhat reluctant to come out due to the COVID-19 pandemic. Subsequently she seems to be doing well and infected I could not obtain actual measurements for the wound the overall appearance of the wound is dramatically improved. She does not appear to have nearly as much space at the 12:00 location and in fact her [name] who is taking care of her mainly states that he is barely able to get anything up into that region is filled  and so nicely. Overall I feel like she is making great progress. 12/12/2018 patient is seen today via a telehealth visit again secondary to her concerns about coming out during the COVID-19 national pandemic. Nonetheless she appears to be doing quite well at this time which is good news. There is no signs of active infection at this time. Overall patient seems to be very pleased as is her son who takes very good care of her. 01/16/2019 on evaluation today patient actually appears to be doing very well with regard to her wound. We have been doing zoom calls quite a bit since the COVID-19 pandemic began just due to the  fact that she has been somewhat concerned about coming out. With that being said she seems to have done very well and in fact the wound is measuring quite a bit smaller today compared to the last measurement. Fortunately there is no signs of active infection at this time. She is also having great improvement overall in her demeanor she is very upbeat and pleased with how things have progressed. 02/21/2019 upon evaluation today patient actually appears to be doing very well with regard to her wound. She has been tolerating the dressing changes her sons been performing these without complication and seems to do an excellent job in my opinion. I am actually very pleased with the overall appearance. The wound is significantly smaller compared to what its been in the past by quite a bit. Electronic Signature(s) Signed: 02/21/2019 12:55:29 PM By: Lenda Kelp PA-C Entered By: Lenda Kelp on 02/21/2019 12:55:29 Jessica [name] ([id number]) -------------------------------------------------------------------------------- Physical Exam Details [name]. Date of Service: 02/21/2019 11:00 AM Medical Record Number: [id number] Patient Account Number: [id number] Date of Birth/Sex: 1945-07-01 (73 y.o. F) Treating RN: Primary Care Provider: Annita Brod Other Clinician: Referring Provider: Annita Brod Treating Provider/Extender: STONE III, HOYT Weeks in Treatment: 90 Constitutional Obese and well-hydrated in no acute distress. Respiratory normal breathing without difficulty. clear to auscultation bilaterally. Cardiovascular regular rate and rhythm with normal S1, S2. Psychiatric this patient is able to make decisions and demonstrates good insight into disease process. Alert and Oriented x 3. pleasant and cooperative. Notes Patient's wound bed currently showed signs of good granulation at this time there did not appear to be any slough buildup no need for sharp  debridement and overall I am very pleased with how things seem to be progressing. I do believe that her son takes excellent care of her and overall seems to be doing a great job with the dressing changes in my opinion. Electronic Signature(s) Signed: 02/21/2019 12:56:07 PM By: Lenda Kelp PA-C Entered By: Lenda Kelp on 02/21/2019 12:56:06 Jessica [name] ([id number]) -------------------------------------------------------------------------------- Physician Orders Details [name]. Date of Service: 02/21/2019 11:00 AM Medical Record Number: [id number] Patient Account Number: [id number] Date of Birth/Sex: 02-06-1946 (73 y.o. F) Treating RN: Curtis Sites Primary Care Provider: Annita Brod Other Clinician: Referring Provider: Annita Brod Treating Provider/Extender: STONE III, HOYT Weeks in Treatment: 90 Verbal / Phone Orders: No Diagnosis Coding ICD-10 Coding Code Description L89.154 Pressure ulcer of sacral region, stage 4 M48.00 Spinal stenosis, site unspecified R54 Age-related physical debility G89.4 Chronic pain syndrome E78.2 Mixed hyperlipidemia L89.93 Pressure ulcer of unspecified site, stage 3 Wound Cleansing Wound #1 Medial Sacrum o Other: - please cleanse sacral wound with dakins moistened gauze - do not spray dakins on wound Anesthetic (add to Medication List) Wound #1 Medial Sacrum o Topical Lidocaine 4% cream  applied to wound bed prior to debridement (In Clinic Only). Skin Barriers/Peri-Wound Care Wound #1 Medial Sacrum o Skin Prep Primary Wound Dressing Wound #1 Medial Sacrum o Pack wound with: - Dakin's soaked gauze Secondary Dressing Wound #1 Medial Sacrum o ABD pad - secure with tape Dressing Change Frequency Wound #1 Medial Sacrum o Change dressing twice daily. Follow-up Appointments Wound #1 Medial Sacrum o Return Appointment in 1 month Off-Loading Wound #1 Medial Sacrum o Turn and reposition  every 2 hours Stucker, Dilyn W. ([id number]) Additional Orders / Instructions Wound #1 Medial Sacrum o Increase protein intake. Home Health Wound #1 Medial Sacrum o Continue Home Health Visits - [facility] o Home Health Nurse may visit PRN to address patientos wound care needs. o FACE TO FACE ENCOUNTER: MEDICARE and MEDICAID PATIENTS: I certify that this patient is under my care and that I had a face-to-face encounter that meets the physician face-to-face encounter requirements with this patient on this date. The encounter with the patient was in whole or in part for the following MEDICAL CONDITION: (primary reason for Home Healthcare) MEDICAL NECESSITY: I certify, that based on my findings, NURSING services are a medically necessary home health service. HOME BOUND STATUS: I certify that my clinical findings support that this patient is homebound (i.e., Due to illness or injury, pt requires aid of supportive devices such as crutches, cane, wheelchairs, walkers, the use of special transportation or the assistance of another person to leave their place of residence. There is a normal inability to leave the home and doing so requires considerable and taxing effort. Other absences are for medical reasons / religious services and are infrequent or of short duration when for other reasons). o If current dressing causes regression in wound condition, may D/C ordered dressing product/s and apply Normal Saline Moist Dressing daily until next Wound Healing Center / Other MD appointment. Notify Wound Healing Center of regression in wound condition at [phone]. o Please direct any NON-WOUND related issues/requests for orders to patient's Primary Care Physician Electronic Signature(s) Signed: 02/21/2019 5:03:01 PM By: Curtis Sites Signed: 02/21/2019 5:47:19 PM By: Lenda Kelp PA-C Entered By: Curtis Sites on 02/21/2019 11:31:57 Jessica [name]  ([id number]) -------------------------------------------------------------------------------- Problem List Details [name]. Date of Service: 02/21/2019 11:00 AM Medical Record Number: [id number] Patient Account Number: [id number] Date of Birth/Sex: November 27, 1945 (73 y.o. F) Treating RN: Primary Care Provider: Annita Brod Other Clinician: Referring Provider: Annita Brod Treating Provider/Extender: STONE III, HOYT Weeks in Treatment: 90 Active Problems ICD-10 Evaluated Encounter Code Description Active Date Today Diagnosis L89.154 Pressure ulcer of sacral region, stage 4 05/27/2017 No Yes M48.00 Spinal stenosis, site unspecified 05/27/2017 No Yes R54 Age-related physical debility 05/27/2017 No Yes G89.4 Chronic pain syndrome 05/27/2017 No Yes E78.2 Mixed hyperlipidemia 05/27/2017 No Yes L89.93 Pressure ulcer of unspecified site, stage 3 05/27/2017 No Yes Inactive Problems Resolved Problems Electronic Signature(s) Signed: 02/21/2019 11:02:21 AM By: Lenda Kelp PA-C Entered By: Lenda Kelp on 02/21/2019 11:02:21 Jessica [name] ([id number]) -------------------------------------------------------------------------------- Progress Note Details [name]. Date of Service: 02/21/2019 11:00 AM Medical Record Number: [id number] Patient Account Number: [id number] Date of Birth/Sex: 1945-10-02 (73 y.o. F) Treating RN: Primary Care Provider: Annita Brod Other Clinician: Referring Provider: Annita Brod Treating Provider/Extender: Linwood Dibbles, HOYT Weeks in Treatment: 90 Subjective Chief Complaint Information obtained from Patient she is here for evaluation of a sacral ulcer History of Present Illness (HPI) 05/27/17-she is here in initial evaluation for a left-sided sacral stage  IV pressure ulcer and bilateral lower extremity, lateral aspect, unstageable pressure ulcers. She is accompanied by her husband and her son, who are her  primary caregivers. She is bedbound secondary to spinal stenosis. According to her son and husband she was hospitalized from 10/5-11/1 with healthcare associated pneumonia and altered mental status. During her hospitalization she was intubated, extubated on 10/27. She was discharged with Foley catheter and follow up with urology. According to her son and spouse she developed the sacral ulcer during hospitalization. Home health has been applying Santyl daily. She does have a low air loss mattress and is repositioned every 2-3 hours per her family's report. According to the son and spouse she had an appointment with urology on 12/18 and during that appointment developed discoloration to her bilateral lower extremities which ultimately developed into unstageable pressure ulcers to the lateral aspects of her bilateral lower extremities. There has been no topical treatment applied to these. She continues to have home health. There is no concerns expressed regarding dietary intake, stating she eats 3 meals a day, eating was provided; she is supplemented with boost with protein. 06/03/17-she is here in follow-up evaluation for sacral and bilateral lower extremity ulcers. Plain film x-ray done today reveals no distraction to the sacrum or coccyx, no visible abnormalities. Home health has ordered the negative pressure wound system but it has not been initiated. We will continue with Hydrofera Blue until initiation of negative pressure wound system and continue with Santyl to bilateral lower extremity ulcers. Follow-up next week 06/10/17-she is here in follow-up evaluation for sacral and bilateral lower extremity ulcers. The wound VAC will be available tomorrow per home health. We will initiate wound VAC therapy to the sacral ulcer 3 times weekly (Thursday, Saturday/Sunday, Tuesday). We will continue with Santyl to the lower extremity ulcers. The patient's son is checking into home health therapy over the  weekend [facility], with the understanding that if VAC changes cannot be performed over the weekend he will need to change his mother's appointments to Monday, Wednesday or Friday. The sacral ulcer clinically does not appear infected but there has been a change in the amount of drainage acutely, there is no significant amount of devitalized tissue, there is no malodor. Wound culture was obtained to evaluate for occult infection we will hold off on antibiotic therapy until sensitivities are resulted. 06/18/17 on evaluation today patient appears to be doing fairly well in my opinion although this is the first time I have seen this patient she has been previously evaluated by Tacey RuizLeah here in our office. She is going to be switching to Fridays to see me due to the Wound VAC schedule being changed on Monday, Wednesday, and Friday. Subsequently she seems to be doing fairly well with the Wound VAC. Her son who was present during evaluation today states that he somewhat stresses of the Wound VAC in making sure that it was functioning appropriately. With that being said everything seems to be working well he knows what [facility] to look at and ensure that it is functioning properly. Overall the wound appears to be nice and clean there is no need for debridement today. She has no discomfort in her bilateral lower extremity ulcers also appear to be improving based on measurements and what this honest tell me about the overall appearance. 07/02/17 on evaluation today I noted in patients wound bed that was actually an odor that had not previously been noted. Subsequently there was also a small  area of bone that was not previously noted during my last evaluation. This did appear to be necrotic and was being somewhat forced out by the body around the region of granulation. She does not have any pain which is good news. Nonetheless the overall appearance of the ulcer is making me concerned for the  patient having developed osteomyelitis. Currently she is not been on any antibiotics and the Wound VAC has been doing fairly well in general. With that being said I do not think we need to continue the Wound VAC if she potentially has a bone infection. At least not until it is properly addressed with antibiotics. Jessica [name], Jessica [name] ([id number]) 07/09/17 on evaluation today patient appears to actually be doing very well in regard to her sacral ulcer compared to last week. There is actually no exposed bone at this point. 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 With that being said still I do believe she needs to have a referral to infectious disease due to the osteomyelitis I am gonna recommend that she continue with the doxycycline based on the culture results which showed Eikenella Corrodens as the organism identified that tetracycline should work for. She does not seem to be having any discomfort whatsoever at this point. 07/16/17 on evaluation today patient actually appears to be doing rather well in regard to her sacral wound. I think that the original wound site actually appears much better than previously noted. With that being said she does have a new superficial injury on the right sacral region which appears to be due to according to the sign transport that occurred for her MRI unfortunately. Fortunately this is not too deep and I do think it can be managed but it was a new area that was not previously noted. Otherwise she has been tolerating the Dakin s soaked gauze packing without complication. 07/30/17 on evaluation today patient presents for reevaluation concerning her sacral wound. She has been tolerating the dressing changes without complication. With that being said her wound is doing so well at this point that I think she may be  at the point where we could reinitiate the Wound VAC currently and hopefully see good results from this. I'm very pleased with how she has responded to the [facility] s soaked gauze packing. 08/13/17 evaluation today patient appears to be doing excellent in regard to her lower extremity ulcer this seems to be cleaning up very nicely. In regard to the sacral ulcer the area of trauma on the right lateral portion of the wound actually appears to be much better than previously noted during the last evaluation. The word has definitely filled in and the Wound VAC appears to be helpful I do believe. Overall I'm very pleased with how things seem to be progressing at this time. Patient likewise is also happy that things are doing well. 08/27/17 on evaluation today patient presents for follow-up concerning her ongoing issues with her sacral ulcer and lower extremity ulcer. Fortunately she has been tolerating the dressing changes well complication the Wound VAC in general seems to have done very well up to this point. She does not have any evidence of infection which is good news. She does have some dark discoloration in central portion of the wound which is troublesome for the possibility of pressure injury to the site it sounds like her prior  air mattress was not functioning properly her son has just bought a new one for her this is doing much better. Otherwise things seem to be progressing nicely. 09/10/17 on evaluation today patient presents for follow-up concerning her sacral wound and lower extremity ulcer. She has been tolerating the switch of her dressing changes to the Dakin s soaked gauze dressing very well in regard to the sacrum. The left lateral lower extremity ulcer appears to have a new area open just distal to the one that we have been treating with Santyl and this is new since her last visit. There does not appear to be any evidence of infection which is good news. With that being said there's a lot  of maceration here at the site and I feel this is likely due to the switch and the dressings it appears that due to the sticking of the dressings it will switch to utilizing a telfa pad over the Santyl. I think this caused a lot of drainage to be collected and situated in the region just under the dressing which has led to this causing which duration of breakdown. Overall there does not appear to be any significant pain which is good news. Of note I did actually have a conversation with the radiologist who is an interventional radiologist with [facility]. 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 With that being said there does not appear to be any rush to get this done according to the interventional radiologist whom I spoke with. Therefore after discussion it was determined that there gonna wait about six months prior to considering the procedure to get time for hopefully the sacral wound to heal in the interim to at least some degree. 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 According to nursing staff these are all improved, the negative pressure wound therapy system was put on hold last week. We will resume negative pressure wound therapy today, continue with Santyl to the lower extremity and she will follow-up next week. 09/24/17 on evaluation today unfortunately the patient's wound appears to be doing significantly worse compared to last week's evaluation and even my valuation the week before. She actually has bone noted on the left wound margin in the region of undermining unfortunately. This was not noted during the last evaluation. I am concerned about the fact that this seems to be worse not better since our last visit with her. My biggest concern is that she's likely developing a worsening osteomyelitis. This was discussed with patient and her son today during  the office visit. 05/03/17 on evaluation today patient actually appears to be doing rather well in regard to her sacral ulcer compared to last week's evaluation. She has been tolerating the dressing changes without complication. Fortunately even though we're not utilizing the Wound VAC she seems to be making some strides in seeing the wound area overall improved quite dramatically in my pinion in just one weeks time. She also seems to be staying off of this to the point that I do not see any evidence of new injury which is excellent news. Whatever she's been doing in that regard over the past week I would want her to continue. Jessica [name], Jessica [name] ([id number]) did also review the results of her bone culture which revealed group B strep as the responsible organism. Again this is what's causing her osteomyelitis she does have infectious  disease appointment scheduled for 10/11/17 10/08/1898 valuation today patient appears to be doing better for the most part in regard to the sacral wound. Overall she is showing signs of having granulation of the bone which is good news. There is an area towards the left of the wound where she does seem to be showing some signs of opening this it would be due to additional pressure to the area unfortunately. There does not appear to be any evidence of infection spreading which is good news that do not see any evidence of significant purulent discharge which is also good news. 10/26/17 on evaluation today patient sacral ulcer actually appears to be very healthy and doing well in regard to the overall appearance of the wound. With that being said she does seem to be having some issues at this point in time with some shear/friction injury of the sacrum from where she was transported to [facility] for her infectious disease appointment. She has been placed on IV antibiotic therapy I will have to go back and find that note for review as I did not have access to that today.  Nonetheless I will add the next visit be sure to document the exact antibiotics that she is taking at this point. Nonetheless I do believe the wound is doing better there's no bone exposure nothing that requires debridement in regard to the sacral wound. Likewise her left lateral lower extremity ulcer also appears to be doing significantly better. In general I feel like things are showing signs of improvement all around. 11/12/17 on evaluation today patient appears to be doing rather well in regard to her sacral wound. In general I do see signs of granulation at this point. Fortunately there does not appear to be any evidence of infection which is good news as well. In general overall happy with the appearance. With that being said I'll be see I would like for this to be progressing more rapidly as with the patient and her son but nonetheless at least things do not appear to be doing worse. Her lower extremity ulcer is doing significantly better. 11/26/17 on evaluation today patient appears to be doing a little better in regard to the sacral wound especially in the peripheral of the wound bed. She actually did have an abrasion on the right gluteal region that's completely resolved to this point. Her lower extremity also appears to be for the most part completely resolved. Overall the sacrum itself seems to be the one thing that is still open and given her trouble. No fevers, chills, nausea, or vomiting noted at this time. She actually has an appointment next week with infectious disease for recheck to see how things are doing. She maybe switch to oral medications at that point. 12/09/17 on evaluation today patient actually appears to be doing much better in regard to her sacral wound. I do feel like she has made good progress at this time as far as healing is concerned. In fact the wound looks better to me today than it has in quite some time. She is on oral antibiotic therapy and no longer on the IV  antibiotics. She did see infectious disease last week. 12/24/17 on evaluation today patient's wounds actually appear to be doing decently well in regard to the sacral wound in particular. When it comes to her lower extremity ulcers both appear to be completely healed at this point in the one area where she had lived the rash has completely resolved at this time. Overall very  pleased with the progress that has been made. Nonetheless the patient seems to be doing well in general. She still does have quite a bit of healing to do in regard to the sacral wound but I feel like she is making progress. 01/07/18 on evaluation today patient actually appears to be doing excellent today regard to her sacral wound. The granular quality in the base of the wound is excellent and shown signs of good improvement there's no evidence of contusion nor deep tissue injury and the periwound actually appears to be doing well. In general I'm very pleased with the overall appearance. 02/04/18 on evaluation today patient's wound actually appears to be doing decently well as far as the granulation is concerned. She has been tolerating the dressing changes without complication. Right now we are still using the Wound VAC. Nonetheless overall there apparently is some cater to the wound and this is concerning simply for the fact that she could be developing a soft tissue infection again. She's had this happen in the past and at that time responded very well to the antibiotics. Nonetheless I don't think I would likely put her on anything prophylactically but rather wait for the results of the culture to return. 02/18/18 on evaluation today patient actually appears to be doing fairly well. She has been tolerating the dressing changes. And the Wound VAC seems to be doing well. Overall I'm very pleased with how things have improved over the past couple weeks since I last saw her. Fortunately there does not appear to be any evidence of  overt infection at this time which is good news. She has been taking the antibiotics without complication. Specifically that is the Bactrim DS which was prescribed on 02/08/18. 03/18/18 on evaluation today patient's wound actually does have quite a bit of odor noted compared to previous. With that Jessica [name], Jessica [name]. ([id number]) being said the wound itself overall does not appear to be too significant or worse. There is no event noted in the wound bed which is good news. With that being said the patient has been worried as the home health nurse stated that she thought she saw bone in the base of the wound. Nonetheless there's a little bit of bruising along the left lateral border of the wound bed which again does not appear to be terrible we have noticed this before but other than this the majority the wound bed appears to be doing excellent in my pinion. I am wondering if there may be some bacterial colonization. This could be leading to some of the odor that we are noting with the Wound VAC. 04/01/18 on evaluation today patient appears to actually be doing well in regard to the overall appearance of the wound itself. She also does not have any odor at this point in regard to the wound surface and I do believe the [place] is extremely helpful in this regard. With that being said she does have a little bit of deep tissue injury on the medial aspect of the wound. Again last time it was more lateral. The lateral seems to have improved quite well. Nonetheless she also notes by way of her son that this has been very macerated in the past several weeks since I last saw her. Though things overall seem to be doing better I still think that the Wound VAC may help with managing her fluid better I'm wondering if we can attempt a gauze Wound VAC my hope is that this will be able to manage  the moisture control as well as continuing with managing the odor of the wound and bacterial load since the [place] s has done  excellent in helping in that regard. 04/15/17 on evaluation today patient actually appears to be doing much better in regard to the overall appearance of her sacral wound. I do believe that the gauze Vac has been of benefit for her. Overall she is tolerating this without complication which is also good news. There is no sign of injury to the sacral region at this point. 04/29/18 on evaluation today patient appears to be doing well in regard to her sacral ulcer. She sure signs of good granulation and the Gauze VAC is doing very well for her. Overall I'm pleased in this regard. The biggest issue is that the time from Friday till Monday seems to be quite long for her as far as the amount of time to keep the Wound VAC sealed. For that reason her son wonders if he can take this off on Sunday morning in order to give her day of just wet to dry dressing's with the Dakin's solution I think this would be okay. 05/13/18 on evaluation today patient appears to be doing okay in regard to her sacral ulcer. There does not appear to be any evidence of overt infection at this time which is good news. In general the drainage and moisture appears to be significantly improved this was after the pressure on the Wound VAC was increased after they contacted our office. With that being said a lot of the dark tissue around the edge of the wound has improved in the maceration is significantly better this seems to be managing much better. Fortunately there is no signs of infection at this time. Overall the patient is doing quite well. The wound measurements are not significantly smaller today although they were over the past two visits we will see how things go. 05/27/18 on evaluation today patient's sacral wound actually appears to be doing fairly well at this point. She does have a deep tissue injury yet again on the medial aspect of the wound which is something that we seem to be seeing often. There is some question as to  whether or not this is actually being caused by the Wound VAC bunching up and then her somewhat laying on it even though they are attempting to offload this as best as possible. Patient son states that overall they do the best they can trying offload her and I definitely believe him. I may want to see about putting the Wound VAC on hold for a couple weeks and see if this happens just with standard dressing changes. 06/10/18 on evaluation today patient actually appears to be doing better in regard to her sacral wound. Overall there's no signs of infection, no odor, and no significant skin breakdown around the edge of the wound I feel like this is done much better 50 Dakin s soaked gauze dressing as opposed to legalizing anything such as the Wound VAC. I believe that we discontinue the Wound VAC as of today. 06/24/18 on evaluation today patient actually appears to be doing well in regard to her sacral ulcer. There's no signs of infection, no odor, and in my pinion no concerns at all. I feel like she is making good progress as far as I'm concerned. 07/12/18 on evaluation today patient appears to be doing very well in regard to her sacral ulcer. She has been tolerating the dressing changes without complication. Fortunately there does  not appear to be any evidence of active infection at this time she is doing excellent as far as I'm concerned at this point. 08/11/18 on evaluation today patient's sacral wound actually appears to be doing quite well at this point. The main issue I see is that she continues to have some maceration and skin breakdown due to moisture over the right periwound location unfortunately. We've investigated the possibility of using zinc oxide before but have not utilized it simply due to the fact that she continues to state. Her son anyways that she is allergic to this. They have not tried it recently and may be completely unrelated to the reaction she had before. Nonetheless this may  be something you want to attempt will discuss that in the plan. 10/04/18 on evaluation today patient actually appears to be doing quite well in regard to her sacral wound. It's been roughly 2 months since I last seen her in that time her wound has definitely made progress. This is very slow but again nonetheless Jessica [name], Jessica [name]. ([id number]) does seem to be trending in the correct direction. Overall very pleased in this regard. No fevers, chills, nausea, or vomiting noted at this time. 11/07/2018 on evaluation today patient appears to be doing decently well with regard to her sacral wound based on what I am seeing at this point. She is seen by way of a telehealth visit due to the COVID-19 national emergency and to be honest the patient is somewhat reluctant to come out to be seen for visits which I completely understand. They feel much safer keeping her home as much as possible due to her adequate condition in general. The only issue that she has had recently she did have some wheezing and subsequently chest congestion she was just placed on albuterol along with prednisone at this point. She seems to be doing much better even after 24 hours of the prednisone according to her [name]. Overall they are pleased with how things seem to be progressing. 11/28/2018 upon evaluation today patient is seen by way of telehealth visit since she is still somewhat reluctant to come out due to the COVID-19 pandemic. Subsequently she seems to be doing well and infected I could not obtain actual measurements for the wound the overall appearance of the wound is dramatically improved. She does not appear to have nearly as much space at the 12:00 location and in fact her [name] who is taking care of her mainly states that he is barely able to get anything up into that region is filled and so nicely. Overall I feel like she is making great progress. 12/12/2018 patient is seen today via a telehealth visit again  secondary to her concerns about coming out during the COVID-19 national pandemic. Nonetheless she appears to be doing quite well at this time which is good news. There is no signs of active infection at this time. Overall patient seems to be very pleased as is her son who takes very good care of her. 01/16/2019 on evaluation today patient actually appears to be doing very well with regard to her wound. We have been doing zoom calls quite a bit since the COVID-19 pandemic began just due to the fact that she has been somewhat concerned about coming out. With that being said she seems to have done very well and in fact the wound is measuring quite a bit smaller today compared to the last measurement. Fortunately there is no signs of active infection at this  time. She is also having great improvement overall in her demeanor she is very upbeat and pleased with how things have progressed. 02/21/2019 upon evaluation today patient actually appears to be doing very well with regard to her wound. She has been tolerating the dressing changes her sons been performing these without complication and seems to do an excellent job in my opinion. I am actually very pleased with the overall appearance. The wound is significantly smaller compared to what its been in the past by quite a bit. Patient History Information obtained from Patient. Family History No family history of Cancer, Diabetes, Heart Disease. Social History Never smoker, Marital Status - Married. Review of Systems (ROS) Constitutional Symptoms (General Health) Denies complaints or symptoms of Fatigue, Fever, Chills, Marked Weight Change. Respiratory Denies complaints or symptoms of Chronic or frequent coughs, Shortness of Breath. Cardiovascular Denies complaints or symptoms of Chest pain, LE edema. Psychiatric Denies complaints or symptoms of Anxiety, Claustrophobia. Jessica [name], Jessica [name] ([id number]) Objective Constitutional Obese and  well-hydrated in no acute distress. Vitals Time Taken: 10:58 AM, Height: 70 in, Temperature: 99 F, Pulse: 75 bpm, Respiratory Rate: 16 breaths/min, Blood Pressure: 153/74 mmHg. Respiratory normal breathing without difficulty. clear to auscultation bilaterally. Cardiovascular regular rate and rhythm with normal S1, S2. Psychiatric this patient is able to make decisions and demonstrates good insight into disease process. Alert and Oriented x 3. pleasant and cooperative. General Notes: Patient's wound bed currently showed signs of good granulation at this time there did not appear to be any slough buildup no need for sharp debridement and overall I am very pleased with how things seem to be progressing. I do believe that her son takes excellent care of her and overall seems to be doing a great job with the dressing changes in my opinion. Integumentary (Hair, Skin) Wound #1 status is Open. Original cause of wound was Pressure Injury. The wound is located on the Medial Sacrum. The wound measures 5.4cm length x 4.5cm width x 2.1cm depth; 19.085cm^2 area and 40.079cm^3 volume. There is muscle, Fat Layer (Subcutaneous Tissue) Exposed, and fascia exposed. There is no tunneling or undermining noted. There is a medium amount of sanguinous drainage noted. Foul odor after cleansing was noted. The wound margin is distinct with the outline attached to the wound base. There is large (67-100%) red, pink, hyper - granulation within the wound bed. There is a small (1-33%) amount of necrotic tissue within the wound bed including Adherent Slough. Assessment Active Problems ICD-10 Pressure ulcer of sacral region, stage 4 Spinal stenosis, site unspecified Age-related physical debility Chronic pain syndrome Mixed hyperlipidemia Pressure ulcer of unspecified site, stage 3 Plan Wound Cleansing: Wound #1 Medial Sacrum: Jessica [name] ([id number]) Other: - please cleanse sacral wound with dakins  moistened gauze - do not spray dakins on wound Anesthetic (add to Medication List): Wound #1 Medial Sacrum: Topical Lidocaine 4% cream applied to wound bed prior to debridement (In Clinic Only). Skin Barriers/Peri-Wound Care: Wound #1 Medial Sacrum: Skin Prep Primary Wound Dressing: Wound #1 Medial Sacrum: Pack wound with: - Dakin's soaked gauze Secondary Dressing: Wound #1 Medial Sacrum: ABD pad - secure with tape Dressing Change Frequency: Wound #1 Medial Sacrum: Change dressing twice daily. Follow-up Appointments: Wound #1 Medial Sacrum: Return Appointment in 1 month Off-Loading: Wound #1 Medial Sacrum: Turn and reposition every 2 hours Additional Orders / Instructions: Wound #1 Medial Sacrum: Increase protein intake. Home Health: Wound #1 Medial Sacrum: Continue Home Health Visits - [facility] Nurse may visit  PRN to address patient s wound care needs. FACE TO FACE ENCOUNTER: MEDICARE and MEDICAID PATIENTS: I certify that this patient is under my care and that I had a face-to-face encounter that meets the physician face-to-face encounter requirements with this patient on this date. The encounter with the patient was in whole or in part for the following MEDICAL CONDITION: (primary reason for Home Healthcare) MEDICAL NECESSITY: I certify, that based on my findings, NURSING services are a medically necessary home health service. HOME BOUND STATUS: I certify that my clinical findings support that this patient is homebound (i.e., Due to illness or injury, pt requires aid of supportive devices such as crutches, cane, wheelchairs, walkers, the use of special transportation or the assistance of another person to leave their place of residence. There is a normal inability to leave the home and doing so requires considerable and taxing effort. Other absences are for medical reasons / religious services and are infrequent or of short duration when for other reasons). If  current dressing causes regression in wound condition, may D/C ordered dressing product/s and apply Normal Saline Moist Dressing daily until next Wound Healing Center / Other MD appointment. Notify Wound Healing Center of regression in wound condition at [phone]. Please direct any NON-WOUND related issues/requests for orders to patient's Primary Care Physician 1. My suggestion to this point is can be that we continue with the current wound care measures since the patient appears to be doing well I am pleased and hopeful that they will continue to see good improvement as we progress week by week. Currently we are utilizing the zinc for the periwound and then subsequently using the Dakin soaked gauze packed into the wound itself. 2. with regard to the offloading I do think turning and repositioning every 2 hours is still something that needs to be done they have done an excellent job with this and I recommend they continue as such. 3. With regard to the patient's protein intake she seems to be doing well in this regard as well I am very pleased and I do not see any issues at this point. We will see patient back for reevaluation in 1 week here in the clinic. If anything worsens or changes patient will contact our office for additional recommendations. Jessica [name], Jessica [name] ([id number]) Electronic Signature(s) Signed: 02/21/2019 12:57:13 PM By: Lenda Kelp PA-C Entered By: Lenda Kelp on 02/21/2019 12:57:12 Jessica [name] ([id number]) -------------------------------------------------------------------------------- ROS/PFSH Details [name]. Date of Service: 02/21/2019 11:00 AM Medical Record Number: [id number] Patient Account Number: [id number] Date of Birth/Sex: [birth year] (73 y.o. F) Treating RN: Primary Care Provider: Annita Brod Other Clinician: Referring Provider: Annita Brod Treating Provider/Extender: STONE III, HOYT Weeks in Treatment:  90 Information Obtained From Patient Constitutional Symptoms (General Health) Complaints and Symptoms: Negative for: Fatigue; Fever; Chills; Marked Weight Change Respiratory Complaints and Symptoms: Negative for: Chronic or frequent coughs; Shortness of Breath Cardiovascular Complaints and Symptoms: Negative for: Chest pain; LE edema Psychiatric Complaints and Symptoms: Negative for: Anxiety; Claustrophobia Immunizations Pneumococcal Vaccine: Received Pneumococcal Vaccination: No Implantable Devices No devices added Family and Social History Cancer: No; Diabetes: No; Heart Disease: No; Never smoker; Marital Status - Married Physician Affirmation I have reviewed and agree with the above information. Electronic Signature(s) Signed: 02/21/2019 5:47:19 PM By: Lenda Kelp PA-C Entered By: Lenda Kelp on 02/21/2019 12:55:48 Jessica [name] ([id number]) -------------------------------------------------------------------------------- SuperBill Details [name]. Date of Service: 02/21/2019 Medical Record Number: [id number] Patient Account  Number: [id number] Date of Birth/Sex: 10/19/1945 (73 y.o. F) Treating RN: Primary Care Provider: Annita Brod Other Clinician: Referring Provider: Annita Brod Treating Provider/Extender: STONE III, HOYT Weeks in Treatment: 90 Diagnosis Coding ICD-10 Codes Code Description L89.154 Pressure ulcer of sacral region, stage 4 M48.00 Spinal stenosis, site unspecified R54 Age-related physical debility G89.4 Chronic pain syndrome E78.2 Mixed hyperlipidemia L89.93 Pressure ulcer of unspecified site, stage 3 Facility Procedures CPT4 Code: [id number] Description: 99213 - WOUND CARE VISIT-LEV 3 EST PT Modifier: Quantity: 1 Physician Procedures CPT4 Code: 5621308 Description: 99214 - WC PHYS LEVEL 4 - EST PT ICD-10 Diagnosis Description L89.154 Pressure ulcer of sacral region, stage 4 M48.00 Spinal stenosis, site  unspecified R54 Age-related physical debility G89.4 Chronic pain syndrome Modifier: Quantity: 1 Electronic Signature(s) Signed: 02/21/2019 12:57:26 PM By: Lenda Kelp PA-C Entered By: Lenda Kelp on 02/21/2019 12:57:26

## 2019-02-23 NOTE — Progress Notes (Signed)
Jessica [name], [name] ([id number]) Visit Report for 02/21/2019 Arrival Information Details [name], [name] Date of Service: 02/21/2019 11:00 AM Medical Record Number: [id number] Patient Account Number: [id number] Date of Birth/Sex: March 24, 1946 [age] y.o. F) Treating RN: Rodell PernaScott, Dajea Primary Care Cauy Melody: Annita BrodASENSO, PHILIP Other Clinician: Referring Sadee Osland: Annita BrodASENSO, PHILIP Treating Danika Kluender/Extender: STONE III, HOYT Weeks in Treatment: 90 Visit Information History Since Last Visit Added or deleted any medications: No Patient Arrived: Stretcher Any new allergies or adverse reactions: No Arrival Time: 10:57 Had a fall or experienced change in No Accompanied By: son activities of daily living that may affect Transfer Assistance: Stretcher risk of falls: Patient Has Alerts: Yes Signs or symptoms of abuse/neglect since last visito No Patient Alerts: ALLERGIC TO ZINC Hospitalized since last visit: No Has Dressing in Place as Prescribed: Yes Pain Present Now: No Electronic Signature(s) Signed: 02/23/2019 3:26:34 PM By: Rodell PernaScott, Dajea Entered By: Rodell PernaScott, Dajea on 02/21/2019 10:58:00 Jessica [name], [name] ([id number]) -------------------------------------------------------------------------------- Clinic Level of Care Assessment Details [name], [name] Date of Service: 02/21/2019 11:00 AM Medical Record Number: [id number] Patient Account Number: [id number] Date of Birth/Sex: March 24, 1946 [age] y.o. F) Treating RN: Curtis Sitesorthy, Joanna Primary Care Tanetta Fuhriman: Annita BrodASENSO, PHILIP Other Clinician: Referring Aniylah Avans: Annita BrodASENSO, PHILIP Treating Liara Holm/Extender: STONE III, HOYT Weeks in Treatment: 90 Clinic Level of Care Assessment Items TOOL 4 Quantity Score []  - Use when only an EandM is performed on FOLLOW-UP visit 0 ASSESSMENTS - Nursing Assessment / Reassessment X - Reassessment of Co-morbidities (includes updates in patient status) 1 10 X- 1 5 Reassessment of Adherence to  Treatment Plan ASSESSMENTS - Wound and Skin Assessment / Reassessment []  - Simple Wound Assessment / Reassessment - one wound 0 []  - 0 Complex Wound Assessment / Reassessment - multiple wounds []  - 0 Dermatologic / Skin Assessment (not related to wound area) ASSESSMENTS - Focused Assessment []  - Circumferential Edema Measurements - multi extremities 0 []  - 0 Nutritional Assessment / Counseling / Intervention []  - 0 Lower Extremity Assessment (monofilament, tuning fork, pulses) []  - 0 Peripheral Arterial Disease Assessment (using hand held doppler) ASSESSMENTS - Ostomy and/or Continence Assessment and Care []  - Incontinence Assessment and Management 0 []  - 0 Ostomy Care Assessment and Management (repouching, etc.) PROCESS - Coordination of Care X - Simple Patient / Family Education for ongoing care 1 15 []  - 0 Complex (extensive) Patient / Family Education for ongoing care X- 1 10 Staff obtains ChiropractorConsents, Records, Test Results / Process Orders []  - 0 Staff telephones HHA, Nursing Homes / Clarify orders / etc []  - 0 Routine Transfer to another Facility (non-emergent condition) []  - 0 Routine Hospital Admission (non-emergent condition) []  - 0 New Admissions / Manufacturing engineernsurance Authorizations / Ordering NPWT, Apligraf, etc. []  - 0 Emergency Hospital Admission (emergent condition) X- 1 10 Simple Discharge Coordination Jessica [name], [name] ([id number]) []  - 0 Complex (extensive) Discharge Coordination PROCESS - Special Needs []  - Pediatric / Minor Patient Management 0 []  - 0 Isolation Patient Management []  - 0 Hearing / Language / Visual special needs []  - 0 Assessment of Community assistance (transportation, D/C planning, etc.) []  - 0 Additional assistance / Altered mentation []  - 0 Support Surface(s) Assessment (bed, cushion, seat, etc.) INTERVENTIONS - Wound Cleansing / Measurement X - Simple Wound Cleansing - one wound 1 5 []  - 0 Complex Wound Cleansing - multiple wounds X-  1 5 Wound Imaging (photographs - any number of wounds) []  - 0 Wound Tracing (instead of photographs) X- 1 5 Simple Wound Measurement -  one wound  - 0 Complex Wound Measurement - multiple wounds INTERVENTIONS - Wound Dressings X - Small Wound Dressing one or multiple wounds 1 10  - 0 Medium Wound Dressing one or multiple wounds  - 0 Large Wound Dressing one or multiple wounds  - 0 Application of Medications - topical  - 0 Application of Medications - injection INTERVENTIONS - Miscellaneous  - External ear exam 0  - 0 Specimen Collection (cultures, biopsies, blood, body fluids, etc.)  - 0 Specimen(s) / Culture(s) sent or taken to Lab for analysis  - 0 Patient Transfer (multiple staff / Nurse, adult / Similar devices)  - 0 Simple Staple / Suture removal (25 or less)  - 0 Complex Staple / Suture removal (26 or more)  - 0 Hypo / Hyperglycemic Management (close monitor of Blood Glucose)  - 0 Ankle / Brachial Index (ABI) - do not check if billed separately X- 1 5 Vital Signs Jessica [name], [name] ([id number]) Has the patient been seen at the hospital within the last three years: Yes Total Score: 80 Level Of Care: New/Established - Level 3 Electronic Signature(s) Signed: 02/21/2019 5:03:01 PM By: Curtis Sites Entered By: Curtis Sites on 02/21/2019 11:32:23 Jessica [name] ([id number]) -------------------------------------------------------------------------------- Encounter Discharge Information Details [name]. Date of Service: 02/21/2019 11:00 AM Medical Record Number: [id number] Patient Account Number: [id number] Date of Birth/Sex: [birth year] (73 y.o. F) Treating RN: Curtis Sites Primary Care Charlesetta Milliron: Annita Brod Other Clinician: Referring Pastor Sgro: Annita Brod Treating Nalea Salce/Extender: Linwood Dibbles, HOYT Weeks in Treatment: 90 Encounter Discharge Information Items Discharge Condition: Stable Ambulatory  Status: Stretcher Discharge Destination: Home Transportation: Private Auto Accompanied By: EMS and son Schedule Follow-up Appointment: Yes Clinical Summary of Care: Electronic Signature(s) Signed: 02/21/2019 5:03:01 PM By: Curtis Sites Entered By: Curtis Sites on 02/21/2019 11:33:21 Jessica [name] ([id number]) -------------------------------------------------------------------------------- Lower Extremity Assessment Details [name]. Date of Service: 02/21/2019 11:00 AM Medical Record Number: [id number] Patient Account Number: [id number] Date of Birth/Sex: 09/02/1945 (73 y.o. F) Treating RN: Rodell Perna Primary Care Brianni Manthe: Annita Brod Other Clinician: Referring Wasyl Dornfeld: Annita Brod Treating Avari Gelles/Extender: Linwood Dibbles, HOYT Weeks in Treatment: 90 Electronic Signature(s) Signed: 02/23/2019 3:26:34 PM By: Rodell Perna Entered By: Rodell Perna on 02/21/2019 11:04:01 Jessica [name] ([id number]) -------------------------------------------------------------------------------- Multi Wound Chart Details [name]. Date of Service: 02/21/2019 11:00 AM Medical Record Number: [id number] Patient Account Number: [id number] Date of Birth/Sex: Aug 12, 1945 (73 y.o. F) Treating RN: Curtis Sites Primary Care Karynn Deblasi: Annita Brod Other Clinician: Referring Chonte Ricke: Annita Brod Treating Ladainian Therien/Extender: STONE III, HOYT Weeks in Treatment: 90 Vital Signs Height(in): 70 Pulse(bpm): 75 Weight(lbs): Blood Pressure(mmHg): 153/74 Body Mass Index(BMI): Temperature(F): 99 Respiratory Rate 16 (breaths/min): Photos: [N/A:N/A] Wound Location: Sacrum - Medial N/A N/A Wounding Event: Pressure Injury N/A N/A Primary Etiology: Pressure Ulcer N/A N/A Date Acquired: 01/15/2017 N/A N/A Weeks of Treatment: 90 N/A N/A Wound Status: Open N/A N/A Measurements [name] x W x D 5.4x4.5x2.1 N/A N/A (cm) Area (cm) : 19.085 N/A  N/A Volume (cm) : 40.079 N/A N/A % Reduction in Area: 48.20% N/A N/A % Reduction in Volume: -8.80% N/A N/A Classification: Category/Stage IV N/A N/A Exudate Amount: Medium N/A N/A Exudate Type: Sanguinous N/A N/A Exudate Color: red N/A N/A Foul Odor After Cleansing: Yes N/A N/A Odor Anticipated Due to No N/A N/A Product Use: Wound Margin: Distinct, outline attached N/A N/A Granulation Amount: Large (67-100%) N/A N/A Granulation Quality: Red, Pink, Hyper-granulation N/A N/A Necrotic Amount: Small (1-33%) N/A N/A  Exposed Structures: Fascia: Yes N/A N/A Fat Layer (Subcutaneous Tissue) Exposed: Yes Muscle: Yes Tendon: No Jessica [name], SNEAD. ([id number]) Joint: No Bone: No Epithelialization: None N/A N/A Treatment Notes Electronic Signature(s) Signed: 02/21/2019 5:03:01 PM By: Curtis Sites Entered By: Curtis Sites on 02/21/2019 11:28:19 Jessica [name] ([id number]) -------------------------------------------------------------------------------- Multi-Disciplinary Care Plan Details [name]. Date of Service: 02/21/2019 11:00 AM Medical Record Number: [id number] Patient Account Number: [id number] Date of Birth/Sex: 12-10-1945 (73 y.o. F) Treating RN: Curtis Sites Primary Care Elliannah Wayment: Annita Brod Other Clinician: Referring Nikcole Eischeid: Annita Brod Treating Lillard Bailon/Extender: STONE III, HOYT Weeks in Treatment: 90 Active Inactive Orientation to the Wound Care Program Nursing Diagnoses: Knowledge deficit related to the wound healing center program Goals: Patient/caregiver will verbalize understanding of the Wound Healing Center Program Date Initiated: 06/03/2017 Target Resolution Date: 06/17/2017 Goal Status: Active Interventions: Provide education on orientation to the wound center Notes: Pressure Nursing Diagnoses: Knowledge deficit related to causes and risk factors for pressure ulcer development Knowledge deficit related to management  of pressures ulcers Potential for impaired tissue integrity related to pressure, friction, moisture, and shear Goals: Patient will remain free from development of additional pressure ulcers Date Initiated: 06/03/2017 Target Resolution Date: 06/17/2017 Goal Status: Active Patient will remain free of pressure ulcers Date Initiated: 06/03/2017 Target Resolution Date: 06/17/2017 Goal Status: Active Patient/caregiver will verbalize risk factors for pressure ulcer development Date Initiated: 06/03/2017 Target Resolution Date: 06/17/2017 Goal Status: Active Interventions: Assess: immobility, friction, shearing, incontinence upon admission and as needed Assess potential for pressure ulcer upon admission and as needed Notes: Wound/Skin Impairment Jessica [name], Jessica [name] ([id number]) Nursing Diagnoses: Impaired tissue integrity Goals: Patient/caregiver will verbalize understanding of skin care regimen Date Initiated: 06/03/2017 Target Resolution Date: 06/17/2017 Goal Status: Active Ulcer/skin breakdown will have a volume reduction of 30% by week 4 Date Initiated: 06/03/2017 Target Resolution Date: 06/17/2017 Goal Status: Active Interventions: Assess ulceration(s) every visit Treatment Activities: Patient referred to home care : 06/03/2017 Skin care regimen initiated : 06/03/2017 Notes: Electronic Signature(s) Signed: 02/21/2019 5:03:01 PM By: Curtis Sites Entered By: Curtis Sites on 02/21/2019 11:28:05 Jessica [name] ([id number]) -------------------------------------------------------------------------------- Pain Assessment Details [name]. Date of Service: 02/21/2019 11:00 AM Medical Record Number: [id number] Patient Account Number: [id number] Date of Birth/Sex: Nov 30, 1945 (73 y.o. F) Treating RN: Rodell Perna Primary Care Lavante Toso: Annita Brod Other Clinician: Referring Karlin Binion: Annita Brod Treating Arryanna Holquin/Extender: STONE III, HOYT Weeks in Treatment:  90 Active Problems Location of Pain Severity and Description of Pain Patient Has Paino No Site Locations Pain Management and Medication Current Pain Management: Electronic Signature(s) Signed: 02/23/2019 3:26:34 PM By: Rodell Perna Entered By: Rodell Perna on 02/21/2019 10:58:05 Jessica [name] ([id number]) -------------------------------------------------------------------------------- Patient/Caregiver Education Details [name]. Date of Service: 02/21/2019 11:00 AM Medical Record Number: [id number] Patient Account Number: [id number] Date of Birth/Gender: 03/09/1946 (74 y.o. F) Treating RN: Curtis Sites Primary Care Physician: Annita Brod Other Clinician: Referring Physician: Annita Brod Treating Physician/Extender: Linwood Dibbles, HOYT Weeks in Treatment: 64 Education Assessment Education Provided To: Patient and Caregiver Education Topics Provided Wound/Skin Impairment: Handouts: Other: wound care as ordered Methods: Demonstration, Explain/Verbal Responses: State content correctly Electronic Signature(s) Signed: 02/21/2019 5:03:01 PM By: Curtis Sites Entered By: Curtis Sites on 02/21/2019 11:32:44 Jessica [name] ([id number]) -------------------------------------------------------------------------------- Wound Assessment Details [name]. Date of Service: 02/21/2019 11:00 AM Medical Record Number: [id number] Patient Account Number: [id number] Date of Birth/Sex: [birth year] (73 y.o. F) Treating RN: Rodell Perna Primary Care Lachele Lievanos: Annita Brod  Other Clinician: Referring Muath Hallam: Clovia Cuff Treating Kaylob Wallen/Extender: STONE III, HOYT Weeks in Treatment: 90 Wound Status Wound Number: 1 Primary Etiology: Pressure Ulcer Wound Location: Sacrum - Medial Wound Status: Open Wounding Event: Pressure Injury Date Acquired: 01/15/2017 Weeks Of Treatment: 90 Clustered Wound: No Photos Wound  Measurements Length: (cm) 5.4 Width: (cm) 4.5 Depth: (cm) 2.1 Area: (cm) 19.085 Volume: (cm) 40.079 % Reduction in Area: 48.2% % Reduction in Volume: -8.8% Epithelialization: None Tunneling: No Undermining: No Wound Description Classification: Category/Stage IV Foul Odor Wound Margin: Distinct, outline attached Due to Pro Exudate Amount: Medium Slough/Fib Exudate Type: Sanguinous Exudate Color: red After Cleansing: Yes duct Use: No rino Yes Wound Bed Granulation Amount: Large (67-100%) Exposed Structure Granulation Quality: Red, Pink, Hyper-granulation Fascia Exposed: Yes Necrotic Amount: Small (1-33%) Fat Layer (Subcutaneous Tissue) Exposed: Yes Necrotic Quality: Adherent Slough Tendon Exposed: No Muscle Exposed: Yes Necrosis of Muscle: No Joint Exposed: No Bone Exposed: No Treatment Notes Jessica [name], GUHL. ([id number]) Wound #1 (Medial Sacrum) Notes Dakins soaked gauze packed into wound, BFD Electronic Signature(s) Signed: 02/23/2019 3:26:34 PM By: Army Melia Entered By: Army Melia on 02/21/2019 11:03:48 Jessica Lora ([id number]) -------------------------------------------------------------------------------- Vitals Details Patient Name: Jessica Lora. Date of Service: 02/21/2019 11:00 AM Medical Record Number: [id number] Patient Account Number: [id number] Date of Birth/Sex: 02/18/1946 (73 y.o. F) Treating RN: Army Melia Primary Care Laquesha Holcomb: Clovia Cuff Other Clinician: Referring Faisal Stradling: Clovia Cuff Treating Shonte Beutler/Extender: STONE III, HOYT Weeks in Treatment: 90 Vital Signs Time Taken: 10:58 Temperature (F): 99 Height (in): 70 Pulse (bpm): 75 Respiratory Rate (breaths/min): 16 Blood Pressure (mmHg): 153/74 Reference Range: 80 - 120 mg / dl Electronic Signature(s) Signed: 02/23/2019 3:26:34 PM By: Army Melia Entered By: Army Melia on 02/21/2019 10:58:43

## 2019-03-21 ENCOUNTER — Encounter: Payer: Medicare Other | Attending: Physician Assistant | Admitting: Physician Assistant

## 2019-03-21 NOTE — Progress Notes (Signed)
Jessica [name], Jessica [name] ([id number]) Visit Report for 03/21/2019 Allergy List Details [name], Jessica [name]. Date of Service: 03/21/2019 4:00 PM Medical Record Number: [id number] Patient Account Number: [id number] Date of Birth/Sex: 12/10/1945 (73 y.o. F) Treating RN: Montey Hora Primary Care Dima Ferrufino: Clovia Cuff Other Clinician: Referring Lailyn Appelbaum: Clovia Cuff Treating Erielle Gawronski/Extender: STONE III, HOYT Weeks in Treatment: 94 Allergies Active Allergies zinc Ambien Allergy Notes Electronic Signature(s) Signed: 03/21/2019 4:36:16 PM By: Worthy Keeler PA-C Entered By: Worthy Keeler on 03/21/2019 16:24:33

## 2019-03-22 NOTE — Progress Notes (Signed)
ZAIDY, ABSHER ([id number]) Visit Report for 03/21/2019 Chief Complaint Document Details [name], MALKIEWICZ. Date of Service: 03/21/2019 4:00 PM Medical Record Number: [id number] Patient Account Number: [id number] Date of Birth/Sex: May 07, 1945 (73 y.o. F) Treating RN: Curtis Sites Primary Care Provider: Annita Brod Other Clinician: Referring Provider: Annita Brod Treating Provider/Extender: Linwood Dibbles, HOYT Weeks in Treatment: 54 Information Obtained from: Patient Chief Complaint she is here for evaluation of a sacral ulcer Electronic Signature(s) Signed: 03/21/2019 4:36:16 PM By: Lenda Kelp PA-C Entered By: Lenda Kelp on 03/21/2019 16:27:53 Jessica [name] Brady ([id number]) -------------------------------------------------------------------------------- HPI Details [name] Brady. Date of Service: 03/21/2019 4:00 PM Medical Record Number: [id number] Patient Account Number: [id number] Date of Birth/Sex: [birth year] (73 y.o. F) Treating RN: Curtis Sites Primary Care Provider: Annita Brod Other Clinician: Referring Provider: Annita Brod Treating Provider/Extender: Linwood Dibbles, HOYT Weeks in Treatment: 40 History of Present Illness HPI Description: 05/27/17-she is here in initial evaluation for a left-sided sacral stage IV pressure ulcer and bilateral lower extremity, lateral aspect, unstageable pressure ulcers. She is accompanied by her husband and her son, who are her primary caregivers. She is bedbound secondary to spinal stenosis. According to her son and husband she was hospitalized from 10/5-11/1 with healthcare associated pneumonia and altered mental status. During her hospitalization she was intubated, extubated on 10/27. She was discharged with Foley catheter and follow up with urology. According to her son and spouse she developed the sacral ulcer during hospitalization. Home health has been applying Santyl daily. She does have  a low air loss mattress and is repositioned every 2-3 hours per her family's report. According to the son and spouse she had an appointment with urology on 12/18 and during that appointment developed discoloration to her bilateral lower extremities which ultimately developed into unstageable pressure ulcers to the lateral aspects of her bilateral lower extremities. There has been no topical treatment applied to these. She continues to have home health. There is no concerns expressed regarding dietary intake, stating she eats 3 meals a day, eating was provided; she is supplemented with boost with protein. 06/03/17-she is here in follow-up evaluation for sacral and bilateral lower extremity ulcers. Plain film x-ray done today reveals no distraction to the sacrum or coccyx, no visible abnormalities. Home health has ordered the negative pressure wound system but it has not been initiated. We will continue with Hydrofera Blue until initiation of negative pressure wound system and continue with Santyl to bilateral lower extremity ulcers. Follow-up next week 06/10/17-she is here in follow-up evaluation for sacral and bilateral lower extremity ulcers. The wound VAC will be available tomorrow per home health. We will initiate wound VAC therapy to the sacral ulcer 3 times weekly (Thursday, Saturday/Sunday, Tuesday). We will continue with Santyl to the lower extremity ulcers. The patient's son is checking into home health therapy over the weekend for [facility] changes, with the understanding that if VAC changes cannot be performed over the weekend he will need to change his mother's appointments to Monday, Wednesday or Friday. The sacral ulcer clinically does not appear infected but there has been a change in the amount of drainage acutely, there is no significant amount of devitalized tissue, there is no malodor. Wound culture was obtained to evaluate for occult infection we will hold off on antibiotic therapy until  sensitivities are resulted. 06/18/17 on evaluation today patient appears to be doing fairly well in my opinion although this is the first time I have seen this patient she  has been previously evaluated by Tacey RuizLeah here in our office. She is going to be switching to Fridays to see me due to the Wound VAC schedule being changed on Monday, Wednesday, and Friday. Subsequently she seems to be doing fairly well with the Wound VAC. Her son who was present during evaluation today states that he somewhat stresses of the Wound VAC in making sure that it was functioning appropriately. With that being said everything seems to be working well he knows what settings on the [facility] itself to look at and ensure that it is functioning properly. Overall the wound appears to be nice and clean there is no need for debridement today. She has no discomfort in her bilateral lower extremity ulcers also appear to be improving based on measurements and what this honest tell me about the overall appearance. 07/02/17 on evaluation today I noted in patients wound bed that was actually an odor that had not previously been noted. Subsequently there was also a small area of bone that was not previously noted during my last evaluation. This did appear to be necrotic and was being somewhat forced out by the body around the region of granulation. She does not have any pain which is good news. Nonetheless the overall appearance of the ulcer is making me concerned for the patient having developed osteomyelitis. Currently she is not been on any antibiotics and the Wound VAC has been doing fairly well in general. With that being said I do not think we need to continue the Wound VAC if she potentially has a bone infection. At least not until it is properly addressed with antibiotics. 07/09/17 on evaluation today patient appears to actually be doing very well in regard to her sacral ulcer compared to last week. There is actually no exposed bone at  this point. 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 With that being said still I do believe she needs to have a referral to infectious disease due to the osteomyelitis I am gonna recommend that she continue with the doxycycline based on the culture results which showed Eikenella Corrodens as the Jessica [name] BradyOVERMAN, Jessica [name] W. ([id number]) organism identified that tetracycline should work for. She does not seem to be having any discomfort whatsoever at this point. 07/16/17 on evaluation today patient actually appears to be doing rather well in regard to her sacral wound. I think that the original wound site actually appears much better than previously noted. With that being said she does have a new superficial injury on the right sacral region which appears to be due to according to the sign transport that occurred for her MRI unfortunately. Fortunately this is not too deep and I do think it can be managed but it was a new area that was not previously noted. Otherwise she has been tolerating the Dakinos soaked gauze packing without complication. 07/30/17 on evaluation today patient presents for reevaluation concerning her sacral wound. She has been tolerating the dressing changes without complication. With that being said her wound is doing so well at this point that I think she may be at the point where we could reinitiate the Wound VAC currently and hopefully see good results from this. I'm very pleased with how she has responded to the Dakinos soaked gauze packing. 08/13/17 evaluation today patient appears to be doing excellent in regard to her lower extremity  ulcer this seems to be cleaning up very nicely. In regard to the sacral ulcer the area of trauma on the right lateral portion of the wound actually appears to be much better than previously noted  during the last evaluation. The word has definitely filled in and the Wound VAC appears to be helpful I do believe. Overall I'm very pleased with how things seem to be progressing at this time. Patient likewise is also happy that things are doing well. 08/27/17 on evaluation today patient presents for follow-up concerning her ongoing issues with her sacral ulcer and lower extremity ulcer. Fortunately she has been tolerating the dressing changes well complication the Wound VAC in general seems to have done very well up to this point. She does not have any evidence of infection which is good news. She does have some dark discoloration in central portion of the wound which is troublesome for the possibility of pressure injury to the site it sounds like her prior air mattress was not functioning properly her son has just bought a new one for her this is doing much better. Otherwise things seem to be progressing nicely. 09/10/17 on evaluation today patient presents for follow-up concerning her sacral wound and lower extremity ulcer. She has been tolerating the switch of her dressing changes to the Dakinos soaked gauze dressing very well in regard to the sacrum. The left lateral lower extremity ulcer appears to have a new area open just distal to the one that we have been treating with Santyl and this is new since her last visit. There does not appear to be any evidence of infection which is good news. With that being said there's a lot of maceration here at the site and I feel this is likely due to the switch and the dressings it appears that due to the sticking of the dressings it will switch to utilizing a telfa pad over the Santyl. I think this caused a lot of drainage to be collected and situated in the region just under the dressing which has led to this causing which duration of breakdown. Overall there does not appear to be any significant pain which is good news. Of note I did actually have a  conversation with the radiologist who is an interventional radiologist with [facility]. 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 With that being said there does not appear to be any rush to get this done according to the interventional radiologist whom I spoke with. Therefore after discussion it was determined that there gonna wait about six months prior to considering the procedure to get time for hopefully the sacral wound to heal in the interim to at least some degree. 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 According to nursing staff these are all improved, the negative pressure wound therapy system was put on hold last week. We will resume negative pressure wound therapy today, continue with Santyl to the lower extremity and she will follow-up next week. 09/24/17 on evaluation today unfortunately the patient's wound appears to be doing significantly worse compared to last week's evaluation and even my valuation the week before. She actually has bone noted on the left wound margin in the region of undermining unfortunately. This was not noted during the last evaluation. I am concerned about the fact that this seems to be worse not better since our last visit with her.  My biggest concern is that she's likely developing a worsening osteomyelitis. This was discussed with patient and her son today during the office visit. 05/03/17 on evaluation today patient actually appears to be doing rather well in regard to her sacral ulcer compared to last week's evaluation. She has been tolerating the dressing changes without complication. Fortunately even though we're not utilizing the Wound VAC she seems to be making some strides in seeing the wound area overall improved quite dramatically in my pinion in just one weeks time. She also seems to be staying off of this to the point  that I do not see any evidence of new injury which is excellent news. Whatever she's been doing in that regard over the past week I would want her to continue. I did also review the results of her bone culture which revealed group B strep as the responsible organism. Again this is what's causing her osteomyelitis she does have infectious disease appointment scheduled for 10/11/17 10/08/1898 valuation today patient appears to be doing better for the most part in regard to the sacral wound. Overall she is showing signs of having granulation of the bone which is good news. There is an area towards the left of the wound where she does seem to be showing some signs of opening this it would be due to additional pressure to the area unfortunately. RAMANDA, PAULES. ([id number]) There does not appear to be any evidence of infection spreading which is good news that do not see any evidence of significant purulent discharge which is also good news. 10/26/17 on evaluation today patient sacral ulcer actually appears to be very healthy and doing well in regard to the overall appearance of the wound. With that being said she does seem to be having some issues at this point in time with some shear/friction injury of the sacrum from where she was transported to [facility] for her infectious disease appointment. She has been placed on IV antibiotic therapy I will have to go back and find that note for review as I did not have access to that today. Nonetheless I will add the next visit be sure to document the exact antibiotics that she is taking at this point. Nonetheless I do believe the wound is doing better there's no bone exposure nothing that requires debridement in regard to the sacral wound. Likewise her left lateral lower extremity ulcer also appears to be doing significantly better. In general I feel like things are showing signs of improvement all around. 11/12/17 on evaluation today patient appears to be doing  rather well in regard to her sacral wound. In general I do see signs of granulation at this point. Fortunately there does not appear to be any evidence of infection which is good news as well. In general overall happy with the appearance. With that being said I'll be see I would like for this to be progressing more rapidly as with the patient and her son but nonetheless at least things do not appear to be doing worse. Her lower extremity ulcer is doing significantly better. 11/26/17 on evaluation today patient appears to be doing a little better in regard to the sacral wound especially in the peripheral of the wound bed. She actually did have an abrasion on the right gluteal region that's completely resolved to this point. Her lower extremity also appears to be for the most part completely resolved. Overall the sacrum itself seems to be the one thing that is still open and given her  trouble. No fevers, chills, nausea, or vomiting noted at this time. She actually has an appointment next week with infectious disease for recheck to see how things are doing. She maybe switch to oral medications at that point. 12/09/17 on evaluation today patient actually appears to be doing much better in regard to her sacral wound. I do feel like she has made good progress at this time as far as healing is concerned. In fact the wound looks better to me today than it has in quite some time. She is on oral antibiotic therapy and no longer on the IV antibiotics. She did see infectious disease last week. 12/24/17 on evaluation today patient's wounds actually appear to be doing decently well in regard to the sacral wound in particular. When it comes to her lower extremity ulcers both appear to be completely healed at this point in the one area where she had lived the rash has completely resolved at this time. Overall very pleased with the progress that has been made. Nonetheless the patient seems to be doing well in general.  She still does have quite a bit of healing to do in regard to the sacral wound but I feel like she is making progress. 01/07/18 on evaluation today patient actually appears to be doing excellent today regard to her sacral wound. The granular quality in the base of the wound is excellent and shown signs of good improvement there's no evidence of contusion nor deep tissue injury and the periwound actually appears to be doing well. In general I'm very pleased with the overall appearance. 02/04/18 on evaluation today patient's wound actually appears to be doing decently well as far as the granulation is concerned. She has been tolerating the dressing changes without complication. Right now we are still using the Wound VAC. Nonetheless overall there apparently is some cater to the wound and this is concerning simply for the fact that she could be developing a soft tissue infection again. She's had this happen in the past and at that time responded very well to the antibiotics. Nonetheless I don't think I would likely put her on anything prophylactically but rather wait for the results of the culture to return. 02/18/18 on evaluation today patient actually appears to be doing fairly well. She has been tolerating the dressing changes. And the Wound VAC seems to be doing well. Overall I'm very pleased with how things have improved over the past couple weeks since I last saw her. Fortunately there does not appear to be any evidence of overt infection at this time which is good news. She has been taking the antibiotics without complication. Specifically that is the Bactrim DS which was prescribed on 02/08/18. 03/18/18 on evaluation today patient's wound actually does have quite a bit of odor noted compared to previous. With that being said the wound itself overall does not appear to be too significant or worse. There is no event noted in the wound bed which is good news. With that being said the patient has  been worried as the home health nurse stated that she thought she saw bone in the base of the wound. Nonetheless there's a little bit of bruising along the left lateral border of the wound bed which again does not appear to be terrible we have noticed this before but other than this the majority the wound bed appears to be doing excellent in my pinion. I am wondering if there may be some bacterial colonization. This could be leading to some  of the odor that we are noting with the Wound VAC. BERENISE, HUNTON ([id number]) 04/01/18 on evaluation today patient appears to actually be doing well in regard to the overall appearance of the wound itself. She also does not have any odor at this point in regard to the wound surface and I do believe the Bacons is extremely helpful in this regard. With that being said she does have a little bit of deep tissue injury on the medial aspect of the wound. Again last time it was more lateral. The lateral seems to have improved quite well. Nonetheless she also notes by way of her son that this has been very macerated in the past several weeks since I last saw her. Though things overall seem to be doing better I still think that the Wound VAC may help with managing her fluid better I'm wondering if we can attempt a gauze Wound VAC my hope is that this will be able to manage the moisture control as well as continuing with managing the odor of the wound and bacterial load since the Dakinos has done excellent in helping in that regard. 04/15/17 on evaluation today patient actually appears to be doing much better in regard to the overall appearance of her sacral wound. I do believe that the gauze Vac has been of benefit for her. Overall she is tolerating this without complication which is also good news. There is no sign of injury to the sacral region at this point. 04/29/18 on evaluation today patient appears to be doing well in regard to her sacral ulcer. She sure signs of  good granulation and the Gauze VAC is doing very well for her. Overall I'm pleased in this regard. The biggest issue is that the time from Friday till Monday seems to be quite long for her as far as the amount of time to keep the Wound VAC sealed. For that reason her son wonders if he can take this off on Sunday morning in order to give her day of just wet to dry dressing's with the Dakin's solution I think this would be okay. 05/13/18 on evaluation today patient appears to be doing okay in regard to her sacral ulcer. There does not appear to be any evidence of overt infection at this time which is good news. In general the drainage and moisture appears to be significantly improved this was after the pressure on the Wound VAC was increased after they contacted our office. With that being said a lot of the dark tissue around the edge of the wound has improved in the maceration is significantly better this seems to be managing much better. Fortunately there is no signs of infection at this time. Overall the patient is doing quite well. The wound measurements are not significantly smaller today although they were over the past two visits we will see how things go. 05/27/18 on evaluation today patient's sacral wound actually appears to be doing fairly well at this point. She does have a deep tissue injury yet again on the medial aspect of the wound which is something that we seem to be seeing often. There is some question as to whether or not this is actually being caused by the Wound VAC bunching up and then her somewhat laying on it even though they are attempting to offload this as best as possible. Patient son states that overall they do the best they can trying offload her and I definitely believe him. I may want to see about  putting the Wound VAC on hold for a couple weeks and see if this happens just with standard dressing changes. 06/10/18 on evaluation today patient actually appears to be doing  better in regard to her sacral wound. Overall there's no signs of infection, no odor, and no significant skin breakdown around the edge of the wound I feel like this is done much better 50 Dakinos soaked gauze dressing as opposed to legalizing anything such as the Wound VAC. I believe that we discontinue the Wound VAC as of today. 06/24/18 on evaluation today patient actually appears to be doing well in regard to her sacral ulcer. There's no signs of infection, no odor, and in my pinion no concerns at all. I feel like she is making good progress as far as I'm concerned. 07/12/18 on evaluation today patient appears to be doing very well in regard to her sacral ulcer. She has been tolerating the dressing changes without complication. Fortunately there does not appear to be any evidence of active infection at this time she is doing excellent as far as I'm concerned at this point. 08/11/18 on evaluation today patient's sacral wound actually appears to be doing quite well at this point. The main issue I see is that she continues to have some maceration and skin breakdown due to moisture over the right periwound location unfortunately. We've investigated the possibility of using zinc oxide before but have not utilized it simply due to the fact that she continues to state. Her son anyways that she is allergic to this. They have not tried it recently and may be completely unrelated to the reaction she had before. Nonetheless this may be something you want to attempt will discuss that in the plan. 10/04/18 on evaluation today patient actually appears to be doing quite well in regard to her sacral wound. It's been roughly 2 months since I last seen her in that time her wound has definitely made progress. This is very slow but again nonetheless does seem to be trending in the correct direction. Overall very pleased in this regard. No fevers, chills, nausea, or vomiting noted at this time. 11/07/2018 on evaluation  today patient appears to be doing decently well with regard to her sacral wound based on what I am seeing at this point. She is seen by way of a telehealth visit due to the COVID-19 national emergency and to be honest the patient is somewhat reluctant to come out to be seen for visits which I completely understand. They feel much safer keeping Jessica [name] BradyOVERMAN, Renesmae W. ([id number]) her home as much as possible due to her adequate condition in general. The only issue that she has had recently she did have some wheezing and subsequently chest congestion she was just placed on albuterol along with prednisone at this point. She seems to be doing much better even after 24 hours of the prednisone according to her son Jessica [name] FellersFrank. Overall they are pleased with how things seem to be progressing. 11/28/2018 upon evaluation today patient is seen by way of telehealth visit since she is still somewhat reluctant to come out due to the COVID-19 pandemic. Subsequently she seems to be doing well and infected I could not obtain actual measurements for the wound the overall appearance of the wound is dramatically improved. She does not appear to have nearly as much space at the 12:00 location and in fact her son Jessica [name] FellersFrank who is taking care of her mainly states that he is barely able to get anything up into  that region is filled and so nicely. Overall I feel like she is making great progress. 12/12/2018 patient is seen today via a telehealth visit again secondary to her concerns about coming out during the COVID-19 national pandemic. Nonetheless she appears to be doing quite well at this time which is good news. There is no signs of active infection at this time. Overall patient seems to be very pleased as is her son who takes very good care of her. 01/16/2019 on evaluation today patient actually appears to be doing very well with regard to her wound. We have been doing zoom calls quite a bit since the COVID-19 pandemic began just due  to the fact that she has been somewhat concerned about coming out. With that being said she seems to have done very well and in fact the wound is measuring quite a bit smaller today compared to the last measurement. Fortunately there is no signs of active infection at this time. She is also having great improvement overall in her demeanor she is very upbeat and pleased with how things have progressed. 02/21/2019 upon evaluation today patient actually appears to be doing very well with regard to her wound. She has been tolerating the dressing changes her sons been performing these without complication and seems to do an excellent job in my opinion. I am actually very pleased with the overall appearance. The wound is significantly smaller compared to what its been in the past by quite a bit. 03/21/19 on evaluation today patient appears to be doing excellent in regard to her wound. She's been tolerating the dressing changes without complication and there does not appear to be evidence of active infection which is good news. She is still tolerating the Dakinos Moistened gauze packing without complication. Electronic Signature(s) Signed: 03/21/2019 4:36:16 PM By: Lenda Kelp PA-C Entered By: Lenda Kelp on 03/21/2019 16:32:16 Jessica [name] Brady ([id number]) -------------------------------------------------------------------------------- Physical Exam Details [name] Brady. Date of Service: 03/21/2019 4:00 PM Medical Record Number: [id number] Patient Account Number: [id number] Date of Birth/Sex: February 08, 1946 (73 y.o. F) Treating RN: Curtis Sites Primary Care Provider: Annita Brod Other Clinician: Referring Provider: Annita Brod Treating Provider/Extender: STONE III, HOYT Weeks in Treatment: 43 Constitutional Well-nourished and well-hydrated in no acute distress. Respiratory normal breathing without difficulty. Psychiatric this patient is able to make decisions and  demonstrates good insight into disease process. Alert and Oriented x 3. pleasant and cooperative. Notes Patient's wound bed currently showed signs of excellent granulation of the wound seems to be filling in quite nicely. I do not see any evidence of active infection at this time which is good news. Overall I'm pleased with the appearance as is the patient and her son. Electronic Signature(s) Signed: 03/21/2019 4:36:16 PM By: Lenda Kelp PA-C Entered By: Lenda Kelp on 03/21/2019 16:32:41 Jessica [name] Brady ([id number]) -------------------------------------------------------------------------------- Physician Orders Details [name] Brady. Date of Service: 03/21/2019 4:00 PM Medical Record Number: [id number] Patient Account Number: [id number] Date of Birth/Sex: 02/20/1946 (73 y.o. F) Treating RN: Curtis Sites Primary Care Provider: Annita Brod Other Clinician: Referring Provider: Annita Brod Treating Provider/Extender: Linwood Dibbles, HOYT Weeks in Treatment: 62 Verbal / Phone Orders: No Diagnosis Coding ICD-10 Coding Code Description L89.154 Pressure ulcer of sacral region, stage 4 M48.00 Spinal stenosis, site unspecified R54 Age-related physical debility G89.4 Chronic pain syndrome E78.2 Mixed hyperlipidemia L89.93 Pressure ulcer of unspecified site, stage 3 Wound Cleansing Wound #1 Medial Sacrum o Other: - please cleanse sacral wound with  dakins moistened gauze - do not spray dakins on wound Anesthetic (add to Medication List) Wound #1 Medial Sacrum o Topical Lidocaine 4% cream applied to wound bed prior to debridement (In Clinic Only). Skin Barriers/Peri-Wound Care Wound #1 Medial Sacrum o Skin Prep Primary Wound Dressing Wound #1 Medial Sacrum o Pack wound with: - Dakin's soaked gauze Secondary Dressing Wound #1 Medial Sacrum o ABD pad - secure with tape Dressing Change Frequency Wound #1 Medial Sacrum o Change dressing twice  daily. Follow-up Appointments Wound #1 Medial Sacrum o Return Appointment in 1 month Off-Loading Wound #1 Medial Sacrum o Turn and reposition every 2 hours Heiland, Geselle W. ([id number]) Additional Orders / Instructions Wound #1 Medial Sacrum o Increase protein intake. Home Health Wound #1 Medial Sacrum o Continue Home Health Visits - [facility] o Home Health Nurse may visit PRN to address patientos wound care needs. o FACE TO FACE ENCOUNTER: MEDICARE and MEDICAID PATIENTS: I certify that this patient is under my care and that I had a face-to-face encounter that meets the physician face-to-face encounter requirements with this patient on this date. The encounter with the patient was in whole or in part for the following MEDICAL CONDITION: (primary reason for Home Healthcare) MEDICAL NECESSITY: I certify, that based on my findings, NURSING services are a medically necessary home health service. HOME BOUND STATUS: I certify that my clinical findings support that this patient is homebound (i.e., Due to illness or injury, pt requires aid of supportive devices such as crutches, cane, wheelchairs, walkers, the use of special transportation or the assistance of another person to leave their place of residence. There is a normal inability to leave the home and doing so requires considerable and taxing effort. Other absences are for medical reasons / religious services and are infrequent or of short duration when for other reasons). o If current dressing causes regression in wound condition, may D/C ordered dressing product/s and apply Normal Saline Moist Dressing daily until next Wound Healing Center / Other MD appointment. Notify Wound Healing Center of regression in wound condition at [phone]. o Please direct any NON-WOUND related issues/requests for orders to patient's Primary Care Physician Electronic Signature(s) Signed: 03/21/2019 4:36:16 PM By: Lenda Kelp  PA-C Entered By: Lenda Kelp on 03/21/2019 16:34:05 Jessica [name] Brady ([id number]) -------------------------------------------------------------------------------- Problem List Details [name] Brady. Date of Service: 03/21/2019 4:00 PM Medical Record Number: [id number] Patient Account Number: [id number] Date of Birth/Sex: 10-20-1945 (73 y.o. F) Treating RN: Curtis Sites Primary Care Provider: Annita Brod Other Clinician: Referring Provider: Annita Brod Treating Provider/Extender: STONE III, HOYT Weeks in Treatment: 67 Active Problems ICD-10 Evaluated Encounter Code Description Active Date Today Diagnosis L89.154 Pressure ulcer of sacral region, stage 4 05/27/2017 No Yes M48.00 Spinal stenosis, site unspecified 05/27/2017 No Yes R54 Age-related physical debility 05/27/2017 No Yes G89.4 Chronic pain syndrome 05/27/2017 No Yes E78.2 Mixed hyperlipidemia 05/27/2017 No Yes L89.93 Pressure ulcer of unspecified site, stage 3 05/27/2017 No Yes Inactive Problems Resolved Problems Electronic Signature(s) Signed: 03/21/2019 4:36:16 PM By: Lenda Kelp PA-C Entered By: Lenda Kelp on 03/21/2019 16:27:23 Jessica [name] Brady ([id number]) -------------------------------------------------------------------------------- Progress Note Details [name] Brady. Date of Service: 03/21/2019 4:00 PM Medical Record Number: [id number] Patient Account Number: [id number] Date of Birth/Sex: [birth year] (73 y.o. F) Treating RN: Curtis Sites Primary Care Provider: Annita Brod Other Clinician: Referring Provider: Annita Brod Treating Provider/Extender: STONE III, HOYT Weeks in Treatment: 10 Subjective Chief Complaint Information obtained from Patient she is here  for evaluation of a sacral ulcer History of Present Illness (HPI) 05/27/17-she is here in initial evaluation for a left-sided sacral stage IV pressure ulcer and bilateral lower extremity,  lateral aspect, unstageable pressure ulcers. She is accompanied by her husband and her son, who are her primary caregivers. She is bedbound secondary to spinal stenosis. According to her son and husband she was hospitalized from 10/5-11/1 with healthcare associated pneumonia and altered mental status. During her hospitalization she was intubated, extubated on 10/27. She was discharged with Foley catheter and follow up with urology. According to her son and spouse she developed the sacral ulcer during hospitalization. Home health has been applying Santyl daily. She does have a low air loss mattress and is repositioned every 2-3 hours per her family's report. According to the son and spouse she had an appointment with urology on 12/18 and during that appointment developed discoloration to her bilateral lower extremities which ultimately developed into unstageable pressure ulcers to the lateral aspects of her bilateral lower extremities. There has been no topical treatment applied to these. She continues to have home health. There is no concerns expressed regarding dietary intake, stating she eats 3 meals a day, eating was provided; she is supplemented with boost with protein. 06/03/17-she is here in follow-up evaluation for sacral and bilateral lower extremity ulcers. Plain film x-ray done today reveals no distraction to the sacrum or coccyx, no visible abnormalities. Home health has ordered the negative pressure wound system but it has not been initiated. We will continue with Hydrofera Blue until initiation of negative pressure wound system and continue with Santyl to bilateral lower extremity ulcers. Follow-up next week 06/10/17-she is here in follow-up evaluation for sacral and bilateral lower extremity ulcers. The wound VAC will be available tomorrow per home health. We will initiate wound VAC therapy to the sacral ulcer 3 times weekly (Thursday, Saturday/Sunday, Tuesday). We will continue with  Santyl to the lower extremity ulcers. The patient's son is checking into home health therapy over the weekend for [facility] changes, with the understanding that if VAC changes cannot be performed over the weekend he will need to change his mother's appointments to Monday, Wednesday or Friday. The sacral ulcer clinically does not appear infected but there has been a change in the amount of drainage acutely, there is no significant amount of devitalized tissue, there is no malodor. Wound culture was obtained to evaluate for occult infection we will hold off on antibiotic therapy until sensitivities are resulted. 06/18/17 on evaluation today patient appears to be doing fairly well in my opinion although this is the first time I have seen this patient she has been previously evaluated by Tacey Ruiz here in our office. She is going to be switching to Fridays to see me due to the Wound VAC schedule being changed on Monday, Wednesday, and Friday. Subsequently she seems to be doing fairly well with the Wound VAC. Her son who was present during evaluation today states that he somewhat stresses of the Wound VAC in making sure that it was functioning appropriately. With that being said everything seems to be working well he knows what settings on the [facility] itself to look at and ensure that it is functioning properly. Overall the wound appears to be nice and clean there is no need for debridement today. She has no discomfort in her bilateral lower extremity ulcers also appear to be improving based on measurements and what this honest tell me about the overall appearance. 07/02/17 on evaluation today I  noted in patients wound bed that was actually an odor that had not previously been noted. Subsequently there was also a small area of bone that was not previously noted during my last evaluation. This did appear to be necrotic and was being somewhat forced out by the body around the region of granulation. She does not have any  pain which is good news. Nonetheless the overall appearance of the ulcer is making me concerned for the patient having developed osteomyelitis. Currently she is not been on any antibiotics and the Wound VAC has been doing fairly well in general. With that being said I do not think we need to continue the Wound VAC if she potentially has a bone infection. At least not until it is properly addressed with antibiotics. Jessica [name], CARANO ([id number]) 07/09/17 on evaluation today patient appears to actually be doing very well in regard to her sacral ulcer compared to last week. There is actually no exposed bone at this point. 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 With that being said still I do believe she needs to have a referral to infectious disease due to the osteomyelitis I am gonna recommend that she continue with the doxycycline based on the culture results which showed Eikenella Corrodens as the organism identified that tetracycline should work for. She does not seem to be having any discomfort whatsoever at this point. 07/16/17 on evaluation today patient actually appears to be doing rather well in regard to her sacral wound. I think that the original wound site actually appears much better than previously noted. With that being said she does have a new superficial injury on the right sacral region which appears to be due to according to the sign transport that occurred for her MRI unfortunately. Fortunately this is not too deep and I do think it can be managed but it was a new area that was not previously noted. Otherwise she has been tolerating the Dakin s soaked gauze packing without complication. 07/30/17 on evaluation today patient presents for reevaluation concerning her sacral wound. She has been tolerating the dressing changes  without complication. With that being said her wound is doing so well at this point that I think she may be at the point where we could reinitiate the Wound VAC currently and hopefully see good results from this. I'm very pleased with how she has responded to the [facility] s soaked gauze packing. 08/13/17 evaluation today patient appears to be doing excellent in regard to her lower extremity ulcer this seems to be cleaning up very nicely. In regard to the sacral ulcer the area of trauma on the right lateral portion of the wound actually appears to be much better than previously noted during the last evaluation. The word has definitely filled in and the Wound VAC appears to be helpful I do believe. Overall I'm very pleased with how things seem to be progressing at this time. Patient likewise is also happy that things are doing well. 08/27/17 on evaluation today patient presents for follow-up concerning her ongoing issues with her sacral ulcer and lower extremity ulcer. Fortunately she has been tolerating the dressing changes well complication the Wound VAC in general seems to have done very well up to this point. She does not have any evidence of infection which is good news. She does have some dark discoloration in  central portion of the wound which is troublesome for the possibility of pressure injury to the site it sounds like her prior air mattress was not functioning properly her son has just bought a new one for her this is doing much better. Otherwise things seem to be progressing nicely. 09/10/17 on evaluation today patient presents for follow-up concerning her sacral wound and lower extremity ulcer. She has been tolerating the switch of her dressing changes to the Dakin s soaked gauze dressing very well in regard to the sacrum. The left lateral lower extremity ulcer appears to have a new area open just distal to the one that we have been treating with Santyl and this is new since her last visit.  There does not appear to be any evidence of infection which is good news. With that being said there's a lot of maceration here at the site and I feel this is likely due to the switch and the dressings it appears that due to the sticking of the dressings it will switch to utilizing a telfa pad over the Santyl. I think this caused a lot of drainage to be collected and situated in the region just under the dressing which has led to this causing which duration of breakdown. Overall there does not appear to be any significant pain which is good news. Of note I did actually have a conversation with the radiologist who is an interventional radiologist with [facility]. 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 With that being said there does not appear to be any rush to get this done according to the interventional radiologist whom I spoke with. Therefore after discussion it was determined that there gonna wait about six months prior to considering the procedure to get time for hopefully the sacral wound to heal in the interim to at least some degree. 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 According to nursing staff these are all improved, the negative pressure wound therapy system was put on hold last week. We will resume negative pressure wound therapy today, continue with Santyl to the lower extremity and she will follow-up next week. 09/24/17 on evaluation today unfortunately the patient's wound appears to be doing significantly worse compared to last week's evaluation and even my valuation the week before. She actually has bone noted on the left wound margin in the region of undermining unfortunately. This was not noted during the last evaluation. I am concerned about the fact that this seems to be worse not better since our last visit with her. My biggest concern is that  she's likely developing a worsening osteomyelitis. This was discussed with patient and her son today during the office visit. 05/03/17 on evaluation today patient actually appears to be doing rather well in regard to her sacral ulcer compared to last week's evaluation. She has been tolerating the dressing changes without complication. Fortunately even though we're not utilizing the Wound VAC she seems to be making some strides in seeing the wound area overall improved quite dramatically in my pinion in just one weeks time. She also seems to be staying off of this to the point that I do not see any evidence of new injury which is excellent news. Whatever she's been doing in that regard over the past week I would want her to continue. DENIESHA, STENGLEIN ([id number]) did also review the results of her  bone culture which revealed group B strep as the responsible organism. Again this is what's causing her osteomyelitis she does have infectious disease appointment scheduled for 10/11/17 10/08/1898 valuation today patient appears to be doing better for the most part in regard to the sacral wound. Overall she is showing signs of having granulation of the bone which is good news. There is an area towards the left of the wound where she does seem to be showing some signs of opening this it would be due to additional pressure to the area unfortunately. There does not appear to be any evidence of infection spreading which is good news that do not see any evidence of significant purulent discharge which is also good news. 10/26/17 on evaluation today patient sacral ulcer actually appears to be very healthy and doing well in regard to the overall appearance of the wound. With that being said she does seem to be having some issues at this point in time with some shear/friction injury of the sacrum from where she was transported to [facility] for her infectious disease appointment. She has been placed on IV  antibiotic therapy I will have to go back and find that note for review as I did not have access to that today. Nonetheless I will add the next visit be sure to document the exact antibiotics that she is taking at this point. Nonetheless I do believe the wound is doing better there's no bone exposure nothing that requires debridement in regard to the sacral wound. Likewise her left lateral lower extremity ulcer also appears to be doing significantly better. In general I feel like things are showing signs of improvement all around. 11/12/17 on evaluation today patient appears to be doing rather well in regard to her sacral wound. In general I do see signs of granulation at this point. Fortunately there does not appear to be any evidence of infection which is good news as well. In general overall happy with the appearance. With that being said I'll be see I would like for this to be progressing more rapidly as with the patient and her son but nonetheless at least things do not appear to be doing worse. Her lower extremity ulcer is doing significantly better. 11/26/17 on evaluation today patient appears to be doing a little better in regard to the sacral wound especially in the peripheral of the wound bed. She actually did have an abrasion on the right gluteal region that's completely resolved to this point. Her lower extremity also appears to be for the most part completely resolved. Overall the sacrum itself seems to be the one thing that is still open and given her trouble. No fevers, chills, nausea, or vomiting noted at this time. She actually has an appointment next week with infectious disease for recheck to see how things are doing. She maybe switch to oral medications at that point. 12/09/17 on evaluation today patient actually appears to be doing much better in regard to her sacral wound. I do feel like she has made good progress at this time as far as healing is concerned. In fact the wound looks  better to me today than it has in quite some time. She is on oral antibiotic therapy and no longer on the IV antibiotics. She did see infectious disease last week. 12/24/17 on evaluation today patient's wounds actually appear to be doing decently well in regard to the sacral wound in particular. When it comes to her lower extremity ulcers both appear to be completely  healed at this point in the one area where she had lived the rash has completely resolved at this time. Overall very pleased with the progress that has been made. Nonetheless the patient seems to be doing well in general. She still does have quite a bit of healing to do in regard to the sacral wound but I feel like she is making progress. 01/07/18 on evaluation today patient actually appears to be doing excellent today regard to her sacral wound. The granular quality in the base of the wound is excellent and shown signs of good improvement there's no evidence of contusion nor deep tissue injury and the periwound actually appears to be doing well. In general I'm very pleased with the overall appearance. 02/04/18 on evaluation today patient's wound actually appears to be doing decently well as far as the granulation is concerned. She has been tolerating the dressing changes without complication. Right now we are still using the Wound VAC. Nonetheless overall there apparently is some cater to the wound and this is concerning simply for the fact that she could be developing a soft tissue infection again. She's had this happen in the past and at that time responded very well to the antibiotics. Nonetheless I don't think I would likely put her on anything prophylactically but rather wait for the results of the culture to return. 02/18/18 on evaluation today patient actually appears to be doing fairly well. She has been tolerating the dressing changes. And the Wound VAC seems to be doing well. Overall I'm very pleased with how things have  improved over the past couple weeks since I last saw her. Fortunately there does not appear to be any evidence of overt infection at this time which is good news. She has been taking the antibiotics without complication. Specifically that is the Bactrim DS which was prescribed on 02/08/18. 03/18/18 on evaluation today patient's wound actually does have quite a bit of odor noted compared to previous. With that SEYLAH, Jessica [name]. ([id number]) being said the wound itself overall does not appear to be too significant or worse. There is no event noted in the wound bed which is good news. With that being said the patient has been worried as the home health nurse stated that she thought she saw bone in the base of the wound. Nonetheless there's a little bit of bruising along the left lateral border of the wound bed which again does not appear to be terrible we have noticed this before but other than this the majority the wound bed appears to be doing excellent in my pinion. I am wondering if there may be some bacterial colonization. This could be leading to some of the odor that we are noting with the Wound VAC. 04/01/18 on evaluation today patient appears to actually be doing well in regard to the overall appearance of the wound itself. She also does not have any odor at this point in regard to the wound surface and I do believe the [place] is extremely helpful in this regard. With that being said she does have a little bit of deep tissue injury on the medial aspect of the wound. Again last time it was more lateral. The lateral seems to have improved quite well. Nonetheless she also notes by way of her son that this has been very macerated in the past several weeks since I last saw her. Though things overall seem to be doing better I still think that the Wound VAC may help with managing her  fluid better I'm wondering if we can attempt a gauze Wound VAC my hope is that this will be able to manage the  moisture control as well as continuing with managing the odor of the wound and bacterial load since the [place] s has done excellent in helping in that regard. 04/15/17 on evaluation today patient actually appears to be doing much better in regard to the overall appearance of her sacral wound. I do believe that the gauze Vac has been of benefit for her. Overall she is tolerating this without complication which is also good news. There is no sign of injury to the sacral region at this point. 04/29/18 on evaluation today patient appears to be doing well in regard to her sacral ulcer. She sure signs of good granulation and the Gauze VAC is doing very well for her. Overall I'm pleased in this regard. The biggest issue is that the time from Friday till Monday seems to be quite long for her as far as the amount of time to keep the Wound VAC sealed. For that reason her son wonders if he can take this off on Sunday morning in order to give her day of just wet to dry dressing's with the Dakin's solution I think this would be okay. 05/13/18 on evaluation today patient appears to be doing okay in regard to her sacral ulcer. There does not appear to be any evidence of overt infection at this time which is good news. In general the drainage and moisture appears to be significantly improved this was after the pressure on the Wound VAC was increased after they contacted our office. With that being said a lot of the dark tissue around the edge of the wound has improved in the maceration is significantly better this seems to be managing much better. Fortunately there is no signs of infection at this time. Overall the patient is doing quite well. The wound measurements are not significantly smaller today although they were over the past two visits we will see how things go. 05/27/18 on evaluation today patient's sacral wound actually appears to be doing fairly well at this point. She does have a deep tissue injury yet again  on the medial aspect of the wound which is something that we seem to be seeing often. There is some question as to whether or not this is actually being caused by the Wound VAC bunching up and then her somewhat laying on it even though they are attempting to offload this as best as possible. Patient son states that overall they do the best they can trying offload her and I definitely believe him. I may want to see about putting the Wound VAC on hold for a couple weeks and see if this happens just with standard dressing changes. 06/10/18 on evaluation today patient actually appears to be doing better in regard to her sacral wound. Overall there's no signs of infection, no odor, and no significant skin breakdown around the edge of the wound I feel like this is done much better 50 Dakin s soaked gauze dressing as opposed to legalizing anything such as the Wound VAC. I believe that we discontinue the Wound VAC as of today. 06/24/18 on evaluation today patient actually appears to be doing well in regard to her sacral ulcer. There's no signs of infection, no odor, and in my pinion no concerns at all. I feel like she is making good progress as far as I'm concerned. 07/12/18 on evaluation today patient appears to  be doing very well in regard to her sacral ulcer. She has been tolerating the dressing changes without complication. Fortunately there does not appear to be any evidence of active infection at this time she is doing excellent as far as I'm concerned at this point. 08/11/18 on evaluation today patient's sacral wound actually appears to be doing quite well at this point. The main issue I see is that she continues to have some maceration and skin breakdown due to moisture over the right periwound location unfortunately. We've investigated the possibility of using zinc oxide before but have not utilized it simply due to the fact that she continues to state. Her son anyways that she is allergic to this.  They have not tried it recently and may be completely unrelated to the reaction she had before. Nonetheless this may be something you want to attempt will discuss that in the plan. 10/04/18 on evaluation today patient actually appears to be doing quite well in regard to her sacral wound. It's been roughly 2 months since I last seen her in that time her wound has definitely made progress. This is very slow but again nonetheless Jessica [name], HAGERTY. ([id number]) does seem to be trending in the correct direction. Overall very pleased in this regard. No fevers, chills, nausea, or vomiting noted at this time. 11/07/2018 on evaluation today patient appears to be doing decently well with regard to her sacral wound based on what I am seeing at this point. She is seen by way of a telehealth visit due to the COVID-19 national emergency and to be honest the patient is somewhat reluctant to come out to be seen for visits which I completely understand. They feel much safer keeping her home as much as possible due to her adequate condition in general. The only issue that she has had recently she did have some wheezing and subsequently chest congestion she was just placed on albuterol along with prednisone at this point. She seems to be doing much better even after 24 hours of the prednisone according to her son Jessica [name] Jessica [name]. Overall they are pleased with how things seem to be progressing. 11/28/2018 upon evaluation today patient is seen by way of telehealth visit since she is still somewhat reluctant to come out due to the COVID-19 pandemic. Subsequently she seems to be doing well and infected I could not obtain actual measurements for the wound the overall appearance of the wound is dramatically improved. She does not appear to have nearly as much space at the 12:00 location and in fact her son Jessica [name] Jessica [name] who is taking care of her mainly states that he is barely able to get anything up into that region is filled and so nicely.  Overall I feel like she is making great progress. 12/12/2018 patient is seen today via a telehealth visit again secondary to her concerns about coming out during the COVID-19 national pandemic. Nonetheless she appears to be doing quite well at this time which is good news. There is no signs of active infection at this time. Overall patient seems to be very pleased as is her son who takes very good care of her. 01/16/2019 on evaluation today patient actually appears to be doing very well with regard to her wound. We have been doing zoom calls quite a bit since the COVID-19 pandemic began just due to the fact that she has been somewhat concerned about coming out. With that being said she seems to have done very well and in fact the wound  is measuring quite a bit smaller today compared to the last measurement. Fortunately there is no signs of active infection at this time. She is also having great improvement overall in her demeanor she is very upbeat and pleased with how things have progressed. 02/21/2019 upon evaluation today patient actually appears to be doing very well with regard to her wound. She has been tolerating the dressing changes her sons been performing these without complication and seems to do an excellent job in my opinion. I am actually very pleased with the overall appearance. The wound is significantly smaller compared to what its been in the past by quite a bit. 03/21/19 on evaluation today patient appears to be doing excellent in regard to her wound. She's been tolerating the dressing changes without complication and there does not appear to be evidence of active infection which is good news. She is still tolerating the Dakin s Moistened gauze packing without complication. Patient History Information obtained from Patient. Allergies zinc, Ambien Family History No family history of Cancer, Diabetes, Heart Disease. Social History Never smoker, Marital Status - Married. Review  of Systems (ROS) Constitutional Symptoms (General Health) Denies complaints or symptoms of Fatigue, Fever, Chills, Marked Weight Change. Respiratory Denies complaints or symptoms of Chronic or frequent coughs, Shortness of Breath. Cardiovascular Denies complaints or symptoms of Chest pain, LE edema. Psychiatric Denies complaints or symptoms of Anxiety, Claustrophobia. DULA, HAVLIK ([id number]) Objective Constitutional Well-nourished and well-hydrated in no acute distress. Respiratory normal breathing without difficulty. Psychiatric this patient is able to make decisions and demonstrates good insight into disease process. Alert and Oriented x 3. pleasant and cooperative. General Notes: Patient's wound bed currently showed signs of excellent granulation of the wound seems to be filling in quite nicely. I do not see any evidence of active infection at this time which is good news. Overall I'm pleased with the appearance as is the patient and her son. 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 Skin Barriers/Peri-Wound Care: Wound #1 Medial Sacrum: Skin Prep Primary Wound Dressing: Wound #1 Medial Sacrum: Jessica [name], Jessica [name]. ([id number]) Pack wound with: - Dakin's soaked gauze Secondary Dressing: Wound #1 Medial Sacrum: ABD pad - secure with tape Dressing Change Frequency: Wound #1 Medial Sacrum: Change dressing twice daily. Follow-up Appointments: Wound #1 Medial Sacrum: Return Appointment in 1  month Off-Loading: Wound #1 Medial Sacrum: Turn and reposition every 2 hours Additional Orders / Instructions: Wound #1 Medial Sacrum: Increase protein intake. Home Health: Wound #1 Medial Sacrum: Continue Home Health Visits - [facility] Nurse may visit PRN to address patient s wound care needs. FACE TO FACE ENCOUNTER: MEDICARE and MEDICAID PATIENTS: I certify that this patient is under my care and that I had a face-to-face encounter that meets the physician face-to-face encounter requirements with this patient on this date. The encounter with the patient was in whole or in part for the following MEDICAL CONDITION: (primary reason for Home Healthcare) MEDICAL NECESSITY: I certify, that based on my findings, NURSING services are a medically necessary home health service. HOME BOUND STATUS: I certify that my clinical  findings support that this patient is homebound (i.e., Due to illness or injury, pt requires aid of supportive devices such as crutches, cane, wheelchairs, walkers, the use of special transportation or the assistance of another person to leave their place of residence. There is a normal inability to leave the home and doing so requires considerable and taxing effort. Other absences are for medical reasons / religious services and are infrequent or of short duration when for other reasons). If current dressing causes regression in wound condition, may D/C ordered dressing product/s and apply Normal Saline Moist Dressing daily until next Wound Healing Center / Other MD appointment. Notify Wound Healing Center of regression in wound condition at [phone]. Please direct any NON-WOUND related issues/requests for orders to patient's Primary Care Physician My suggestion at this point is gonna be that we go ahead and continue with the Current wound care measures which includes the Dakin's moistened dressing. They're using zinc oxide around the edges of the wound which also  seems to be beneficial gauze and then securing everything with an ABD pad and tape. Overall pleased with the progress she's making this seems to be going quite well. I'm in a recommend therefore that we continue with the current orders. Please see above for specific wound care orders. We will see patient for re-evaluation in 1 month(s) here in the clinic. If anything worsens or changes patient will contact our office for additional recommendations. Patient was seen today for a telehealth visit due to the fact that she is concerned about coming out in order to be seen. Coincidentally I also am currently in quarantine due to being tested for Covid-19 Virus. Electronic Signature(s) Signed: 03/21/2019 4:36:16 PM By: Lenda Kelp PA-C Entered By: Lenda Kelp on 03/21/2019 16:34:45 Jessica [name] Brady ([id number]) -------------------------------------------------------------------------------- ROS/PFSH Details [name] Brady. Date of Service: 03/21/2019 4:00 PM Medical Record Number: [id number] Patient Account Number: [id number] Date of Birth/Sex: 02-04-1946 (73 y.o. F) Treating RN: Curtis Sites Primary Care Provider: Annita Brod Other Clinician: Referring Provider: Annita Brod Treating Provider/Extender: STONE III, HOYT Weeks in Treatment: 57 Information Obtained From Patient Constitutional Symptoms (General Health) Complaints and Symptoms: Negative for: Fatigue; Fever; Chills; Marked Weight Change Respiratory Complaints and Symptoms: Negative for: Chronic or frequent coughs; Shortness of Breath Cardiovascular Complaints and Symptoms: Negative for: Chest pain; LE edema Psychiatric Complaints and Symptoms: Negative for: Anxiety; Claustrophobia Immunizations Pneumococcal Vaccine: Received Pneumococcal Vaccination: No Implantable Devices No devices added Family and Social History Cancer: No; Diabetes: No; Heart Disease: No; Never smoker; Marital Status -  Married Physician Affirmation I have reviewed and agree with the above information. Electronic Signature(s) Signed: 03/21/2019 4:36:16 PM By: Lenda Kelp PA-C Signed: 03/22/2019 5:12:19 PM By: Curtis Sites Entered By: Lenda Kelp on 03/21/2019 16:26:31 Jessica [name] Brady ([id number]) -------------------------------------------------------------------------------- SuperBill Details [name] Brady. Date of Service: 03/21/2019 Medical Record Number: [id number] Patient Account Number: [id number] Date of Birth/Sex: 12/02/1945 (73 y.o. F) Treating RN: Curtis Sites Primary Care Provider: Annita Brod Other Clinician: Referring Provider: Annita Brod Treating Provider/Extender: STONE III, HOYT Weeks in Treatment: 94 Diagnosis Coding ICD-10 Codes Code Description L89.154 Pressure ulcer of sacral region, stage 4 M48.00 Spinal stenosis, site unspecified R54 Age-related physical debility G89.4 Chronic pain syndrome E78.2 Mixed hyperlipidemia L89.93 Pressure ulcer of unspecified site, stage 3 Physician Procedures CPT4 Code: [id number] Description: 99213 - WC PHYS LEVEL 3 - EST PT ICD-10 Diagnosis Description L89.154 Pressure ulcer of sacral region, stage 4 M48.00 Spinal stenosis, site  unspecified R54 Age-related physical debility G89.4 Chronic pain syndrome Modifier: Quantity: 1 Electronic Signature(s) Signed: 03/21/2019 4:36:16 PM By: Worthy Keeler PA-C Entered By: Worthy Keeler on 03/21/2019 16:35:20

## 2019-04-04 ENCOUNTER — Other Ambulatory Visit: Payer: Self-pay | Admitting: Interventional Radiology

## 2019-04-04 DIAGNOSIS — N2889 Other specified disorders of kidney and ureter: Secondary | ICD-10-CM

## 2019-04-18 ENCOUNTER — Encounter: Payer: Medicare Other | Attending: Physician Assistant | Admitting: Physician Assistant

## 2019-04-18 NOTE — Progress Notes (Signed)
BRISSA, ASANTE ([id number]) Visit Report for 04/18/2019 Allergy List Details [name], Jessica [name]. Date of Service: 04/18/2019 4:00 PM Medical Record Number: [id number] Patient Account Number: [id number] Date of Birth/Sex: [birth year] (74 y.o. F) Treating RN: Curtis Sites Primary Care Faelyn Sigler: Annita Brod Other Clinician: Referring Eimi Viney: Annita Brod Treating [name]/Extender: STONE III, HOYT Weeks in Treatment: 98 Allergies Active Allergies zinc Ambien Allergy Notes Electronic Signature(s) Signed: 04/18/2019 4:58:06 PM By: Lenda Kelp PA-C Entered By: Lenda Kelp on 04/18/2019 16:58:05

## 2019-04-18 NOTE — Progress Notes (Addendum)
Jessica [name], Jessica [name] ([id number]) Visit Report for 04/18/2019 Chief Complaint Document Details [name], Jessica [name]. Date of Service: 04/18/2019 4:00 PM Medical Record Number: [id number] Patient Account Number: [id number] Date of Birth/Sex: July 08, 1945 (74 y.o. F) Treating RN: Curtis Sites Primary Care Provider: Annita Brod Other Clinician: Referring Provider: Annita Brod Treating Provider/Extender: Linwood Dibbles, Rondal Vandevelde Weeks in Treatment: 62 Information Obtained from: Patient Chief Complaint she is here for evaluation of a sacral ulcer Electronic Signature(s) Signed: 04/18/2019 4:58:30 PM By: Lenda Kelp PA-C Entered By: Lenda Kelp on 04/18/2019 16:58:30 Jessica [name] ([id number]) -------------------------------------------------------------------------------- HPI Details [name]. Date of Service: 04/18/2019 4:00 PM Medical Record Number: [id number] Patient Account Number: [id number] Date of Birth/Sex: 18-Nov-1945 (74 y.o. F) Treating RN: Curtis Sites Primary Care Provider: Annita Brod Other Clinician: Referring Provider: Annita Brod Treating Provider/Extender: Linwood Dibbles, Imani Fiebelkorn Weeks in Treatment: 98 History of Present Illness HPI Description: 05/27/17-she is here in initial evaluation for a left-sided sacral stage IV pressure ulcer and bilateral lower extremity, lateral aspect, unstageable pressure ulcers. She is accompanied by her husband and her son, who are her primary caregivers. She is bedbound secondary to spinal stenosis. According to her son and husband she was hospitalized from 10/5-11/1 with healthcare associated pneumonia and altered mental status. During her hospitalization she was intubated, extubated on 10/27. She was discharged with Foley catheter and follow up with urology. According to her son and spouse she developed the sacral ulcer during hospitalization. Home health has been applying Santyl daily. She does have a  low air loss mattress and is repositioned every 2-3 hours per her family's report. According to the son and spouse she had an appointment with urology on 12/18 and during that appointment developed discoloration to her bilateral lower extremities which ultimately developed into unstageable pressure ulcers to the lateral aspects of her bilateral lower extremities. There has been no topical treatment applied to these. She continues to have home health. There is no concerns expressed regarding dietary intake, stating she eats 3 meals a day, eating was provided; she is supplemented with boost with protein. 06/03/17-she is here in follow-up evaluation for sacral and bilateral lower extremity ulcers. Plain film x-ray done today reveals no distraction to the sacrum or coccyx, no visible abnormalities. Home health has ordered the negative pressure wound system but it has not been initiated. We will continue with Hydrofera Blue until initiation of negative pressure wound system and continue with Santyl to bilateral lower extremity ulcers. Follow-up next week 06/10/17-she is here in follow-up evaluation for sacral and bilateral lower extremity ulcers. The wound VAC will be available tomorrow per home health. We will initiate wound VAC therapy to the sacral ulcer 3 times weekly (Thursday, Saturday/Sunday, Tuesday). We will continue with Santyl to the lower extremity ulcers. The patient's son is checking into home health therapy over the weekend for [facility] changes, with the understanding that if VAC changes cannot be performed over the weekend he will need to change his mother's appointments to Monday, Wednesday or Friday. The sacral ulcer clinically does not appear infected but there has been a change in the amount of drainage acutely, there is no significant amount of devitalized tissue, there is no malodor. Wound culture was obtained to evaluate for occult infection we will hold off on antibiotic therapy until  sensitivities are resulted. 06/18/17 on evaluation today patient appears to be doing fairly well in my opinion although this is the first time I have seen this patient she  has been previously evaluated by Tacey RuizLeah here in our office. She is going to be switching to Fridays to see me due to the Wound VAC schedule being changed on Monday, Wednesday, and Friday. Subsequently she seems to be doing fairly well with the Wound VAC. Her son who was present during evaluation today states that he somewhat stresses of the Wound VAC in making sure that it was functioning appropriately. With that being said everything seems to be working well he knows what settings on the [facility] itself to look at and ensure that it is functioning properly. Overall the wound appears to be nice and clean there is no need for debridement today. She has no discomfort in her bilateral lower extremity ulcers also appear to be improving based on measurements and what this honest tell me about the overall appearance. 07/02/17 on evaluation today I noted in patients wound bed that was actually an odor that had not previously been noted. Subsequently there was also a small area of bone that was not previously noted during my last evaluation. This did appear to be necrotic and was being somewhat forced out by the body around the region of granulation. She does not have any pain which is good news. Nonetheless the overall appearance of the ulcer is making me concerned for the patient having developed osteomyelitis. Currently she is not been on any antibiotics and the Wound VAC has been doing fairly well in general. With that being said I do not think we need to continue the Wound VAC if she potentially has a bone infection. At least not until it is properly addressed with antibiotics. 07/09/17 on evaluation today patient appears to actually be doing very well in regard to her sacral ulcer compared to last week. There is actually no exposed bone at  this point. 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 With that being said still I do believe she needs to have a referral to infectious disease due to the osteomyelitis I am gonna recommend that she continue with the doxycycline based on the culture results which showed Eikenella Corrodens as the Jessica [name], [name] ([id number]) organism identified that tetracycline should work for. She does not seem to be having any discomfort whatsoever at this point. 07/16/17 on evaluation today patient actually appears to be doing rather well in regard to her sacral wound. I think that the original wound site actually appears much better than previously noted. With that being said she does have a new superficial injury on the right sacral region which appears to be due to according to the sign transport that occurred for her MRI unfortunately. Fortunately this is not too deep and I do think it can be managed but it was a new area that was not previously noted. Otherwise she has been tolerating the Dakinos soaked gauze packing without complication. 07/30/17 on evaluation today patient presents for reevaluation concerning her sacral wound. She has been tolerating the dressing changes without complication. With that being said her wound is doing so well at this point that I think she may be at the point where we could reinitiate the Wound VAC currently and hopefully see good results from this. I'm very pleased with how she has responded to the Dakinos soaked gauze packing. 08/13/17 evaluation today patient appears to be doing excellent in regard to her lower extremity  ulcer this seems to be cleaning up very nicely. In regard to the sacral ulcer the area of trauma on the right lateral portion of the wound actually appears to be much better than previously noted  during the last evaluation. The word has definitely filled in and the Wound VAC appears to be helpful I do believe. Overall I'm very pleased with how things seem to be progressing at this time. Patient likewise is also happy that things are doing well. 08/27/17 on evaluation today patient presents for follow-up concerning her ongoing issues with her sacral ulcer and lower extremity ulcer. Fortunately she has been tolerating the dressing changes well complication the Wound VAC in general seems to have done very well up to this point. She does not have any evidence of infection which is good news. She does have some dark discoloration in central portion of the wound which is troublesome for the possibility of pressure injury to the site it sounds like her prior air mattress was not functioning properly her son has just bought a new one for her this is doing much better. Otherwise things seem to be progressing nicely. 09/10/17 on evaluation today patient presents for follow-up concerning her sacral wound and lower extremity ulcer. She has been tolerating the switch of her dressing changes to the Dakinos soaked gauze dressing very well in regard to the sacrum. The left lateral lower extremity ulcer appears to have a new area open just distal to the one that we have been treating with Santyl and this is new since her last visit. There does not appear to be any evidence of infection which is good news. With that being said there's a lot of maceration here at the site and I feel this is likely due to the switch and the dressings it appears that due to the sticking of the dressings it will switch to utilizing a telfa pad over the Santyl. I think this caused a lot of drainage to be collected and situated in the region just under the dressing which has led to this causing which duration of breakdown. Overall there does not appear to be any significant pain which is good news. Of note I did actually have a  conversation with the radiologist who is an interventional radiologist with [facility]. 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 With that being said there does not appear to be any rush to get this done according to the interventional radiologist whom I spoke with. Therefore after discussion it was determined that there gonna wait about six months prior to considering the procedure to get time for hopefully the sacral wound to heal in the interim to at least some degree. 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 According to nursing staff these are all improved, the negative pressure wound therapy system was put on hold last week. We will resume negative pressure wound therapy today, continue with Santyl to the lower extremity and she will follow-up next week. 09/24/17 on evaluation today unfortunately the patient's wound appears to be doing significantly worse compared to last week's evaluation and even my valuation the week before. She actually has bone noted on the left wound margin in the region of undermining unfortunately. This was not noted during the last evaluation. I am concerned about the fact that this seems to be worse not better since our last visit with her.  My biggest concern is that she's likely developing a worsening osteomyelitis. This was discussed with patient and her son today during the office visit. 05/03/17 on evaluation today patient actually appears to be doing rather well in regard to her sacral ulcer compared to last week's evaluation. She has been tolerating the dressing changes without complication. Fortunately even though we're not utilizing the Wound VAC she seems to be making some strides in seeing the wound area overall improved quite dramatically in my pinion in just one weeks time. She also seems to be staying off of this to the point  that I do not see any evidence of new injury which is excellent news. Whatever she's been doing in that regard over the past week I would want her to continue. I did also review the results of her bone culture which revealed group B strep as the responsible organism. Again this is what's causing her osteomyelitis she does have infectious disease appointment scheduled for 10/11/17 10/08/1898 valuation today patient appears to be doing better for the most part in regard to the sacral wound. Overall she is showing signs of having granulation of the bone which is good news. There is an area towards the left of the wound where she does seem to be showing some signs of opening this it would be due to additional pressure to the area unfortunately. Jessica [name], Jessica [name]. ([id number]) There does not appear to be any evidence of infection spreading which is good news that do not see any evidence of significant purulent discharge which is also good news. 10/26/17 on evaluation today patient sacral ulcer actually appears to be very healthy and doing well in regard to the overall appearance of the wound. With that being said she does seem to be having some issues at this point in time with some shear/friction injury of the sacrum from where she was transported to [facility] for her infectious disease appointment. She has been placed on IV antibiotic therapy I will have to go back and find that note for review as I did not have access to that today. Nonetheless I will add the next visit be sure to document the exact antibiotics that she is taking at this point. Nonetheless I do believe the wound is doing better there's no bone exposure nothing that requires debridement in regard to the sacral wound. Likewise her left lateral lower extremity ulcer also appears to be doing significantly better. In general I feel like things are showing signs of improvement all around. 11/12/17 on evaluation today patient appears to be doing  rather well in regard to her sacral wound. In general I do see signs of granulation at this point. Fortunately there does not appear to be any evidence of infection which is good news as well. In general overall happy with the appearance. With that being said I'll be see I would like for this to be progressing more rapidly as with the patient and her son but nonetheless at least things do not appear to be doing worse. Her lower extremity ulcer is doing significantly better. 11/26/17 on evaluation today patient appears to be doing a little better in regard to the sacral wound especially in the peripheral of the wound bed. She actually did have an abrasion on the right gluteal region that's completely resolved to this point. Her lower extremity also appears to be for the most part completely resolved. Overall the sacrum itself seems to be the one thing that is still open and given her  trouble. No fevers, chills, nausea, or vomiting noted at this time. She actually has an appointment next week with infectious disease for recheck to see how things are doing. She maybe switch to oral medications at that point. 12/09/17 on evaluation today patient actually appears to be doing much better in regard to her sacral wound. I do feel like she has made good progress at this time as far as healing is concerned. In fact the wound looks better to me today than it has in quite some time. She is on oral antibiotic therapy and no longer on the IV antibiotics. She did see infectious disease last week. 12/24/17 on evaluation today patient's wounds actually appear to be doing decently well in regard to the sacral wound in particular. When it comes to her lower extremity ulcers both appear to be completely healed at this point in the one area where she had lived the rash has completely resolved at this time. Overall very pleased with the progress that has been made. Nonetheless the patient seems to be doing well in general.  She still does have quite a bit of healing to do in regard to the sacral wound but I feel like she is making progress. 01/07/18 on evaluation today patient actually appears to be doing excellent today regard to her sacral wound. The granular quality in the base of the wound is excellent and shown signs of good improvement there's no evidence of contusion nor deep tissue injury and the periwound actually appears to be doing well. In general I'm very pleased with the overall appearance. 02/04/18 on evaluation today patient's wound actually appears to be doing decently well as far as the granulation is concerned. She has been tolerating the dressing changes without complication. Right now we are still using the Wound VAC. Nonetheless overall there apparently is some cater to the wound and this is concerning simply for the fact that she could be developing a soft tissue infection again. She's had this happen in the past and at that time responded very well to the antibiotics. Nonetheless I don't think I would likely put her on anything prophylactically but rather wait for the results of the culture to return. 02/18/18 on evaluation today patient actually appears to be doing fairly well. She has been tolerating the dressing changes. And the Wound VAC seems to be doing well. Overall I'm very pleased with how things have improved over the past couple weeks since I last saw her. Fortunately there does not appear to be any evidence of overt infection at this time which is good news. She has been taking the antibiotics without complication. Specifically that is the Bactrim DS which was prescribed on 02/08/18. 03/18/18 on evaluation today patient's wound actually does have quite a bit of odor noted compared to previous. With that being said the wound itself overall does not appear to be too significant or worse. There is no event noted in the wound bed which is good news. With that being said the patient has  been worried as the home health nurse stated that she thought she saw bone in the base of the wound. Nonetheless there's a little bit of bruising along the left lateral border of the wound bed which again does not appear to be terrible we have noticed this before but other than this the majority the wound bed appears to be doing excellent in my pinion. I am wondering if there may be some bacterial colonization. This could be leading to some  of the odor that we are noting with the Wound VAC. Jessica [name], Jessica [name] ([id number]) 04/01/18 on evaluation today patient appears to actually be doing well in regard to the overall appearance of the wound itself. She also does not have any odor at this point in regard to the wound surface and I do believe the Bacons is extremely helpful in this regard. With that being said she does have a little bit of deep tissue injury on the medial aspect of the wound. Again last time it was more lateral. The lateral seems to have improved quite well. Nonetheless she also notes by way of her son that this has been very macerated in the past several weeks since I last saw her. Though things overall seem to be doing better I still think that the Wound VAC may help with managing her fluid better I'm wondering if we can attempt a gauze Wound VAC my hope is that this will be able to manage the moisture control as well as continuing with managing the odor of the wound and bacterial load since the Dakinos has done excellent in helping in that regard. 04/15/17 on evaluation today patient actually appears to be doing much better in regard to the overall appearance of her sacral wound. I do believe that the gauze Vac has been of benefit for her. Overall she is tolerating this without complication which is also good news. There is no sign of injury to the sacral region at this point. 04/29/18 on evaluation today patient appears to be doing well in regard to her sacral ulcer. She sure signs of  good granulation and the Gauze VAC is doing very well for her. Overall I'm pleased in this regard. The biggest issue is that the time from Friday till Monday seems to be quite long for her as far as the amount of time to keep the Wound VAC sealed. For that reason her son wonders if he can take this off on Sunday morning in order to give her day of just wet to dry dressing's with the Dakin's solution I think this would be okay. 05/13/18 on evaluation today patient appears to be doing okay in regard to her sacral ulcer. There does not appear to be any evidence of overt infection at this time which is good news. In general the drainage and moisture appears to be significantly improved this was after the pressure on the Wound VAC was increased after they contacted our office. With that being said a lot of the dark tissue around the edge of the wound has improved in the maceration is significantly better this seems to be managing much better. Fortunately there is no signs of infection at this time. Overall the patient is doing quite well. The wound measurements are not significantly smaller today although they were over the past two visits we will see how things go. 05/27/18 on evaluation today patient's sacral wound actually appears to be doing fairly well at this point. She does have a deep tissue injury yet again on the medial aspect of the wound which is something that we seem to be seeing often. There is some question as to whether or not this is actually being caused by the Wound VAC bunching up and then her somewhat laying on it even though they are attempting to offload this as best as possible. Patient son states that overall they do the best they can trying offload her and I definitely believe him. I may want to see about  putting the Wound VAC on hold for a couple weeks and see if this happens just with standard dressing changes. 06/10/18 on evaluation today patient actually appears to be doing  better in regard to her sacral wound. Overall there's no signs of infection, no odor, and no significant skin breakdown around the edge of the wound I feel like this is done much better 50 Dakinos soaked gauze dressing as opposed to legalizing anything such as the Wound VAC. I believe that we discontinue the Wound VAC as of today. 06/24/18 on evaluation today patient actually appears to be doing well in regard to her sacral ulcer. There's no signs of infection, no odor, and in my pinion no concerns at all. I feel like she is making good progress as far as I'm concerned. 07/12/18 on evaluation today patient appears to be doing very well in regard to her sacral ulcer. She has been tolerating the dressing changes without complication. Fortunately there does not appear to be any evidence of active infection at this time she is doing excellent as far as I'm concerned at this point. 08/11/18 on evaluation today patient's sacral wound actually appears to be doing quite well at this point. The main issue I see is that she continues to have some maceration and skin breakdown due to moisture over the right periwound location unfortunately. We've investigated the possibility of using zinc oxide before but have not utilized it simply due to the fact that she continues to state. Her son anyways that she is allergic to this. They have not tried it recently and may be completely unrelated to the reaction she had before. Nonetheless this may be something you want to attempt will discuss that in the plan. 10/04/18 on evaluation today patient actually appears to be doing quite well in regard to her sacral wound. It's been roughly 2 months since I last seen her in that time her wound has definitely made progress. This is very slow but again nonetheless does seem to be trending in the correct direction. Overall very pleased in this regard. No fevers, chills, nausea, or vomiting noted at this time. 11/07/2018 on evaluation  today patient appears to be doing decently well with regard to her sacral wound based on what I am seeing at this point. She is seen by way of a telehealth visit due to the COVID-19 national emergency and to be honest the patient is somewhat reluctant to come out to be seen for visits which I completely understand. They feel much safer keeping Jessica [name], Renesmae W. ([id number]) her home as much as possible due to her adequate condition in general. The only issue that she has had recently she did have some wheezing and subsequently chest congestion she was just placed on albuterol along with prednisone at this point. She seems to be doing much better even after 24 hours of the prednisone according to her son [name]. Overall they are pleased with how things seem to be progressing. 11/28/2018 upon evaluation today patient is seen by way of telehealth visit since she is still somewhat reluctant to come out due to the COVID-19 pandemic. Subsequently she seems to be doing well and infected I could not obtain actual measurements for the wound the overall appearance of the wound is dramatically improved. She does not appear to have nearly as much space at the 12:00 location and in fact her son [name] who is taking care of her mainly states that he is barely able to get anything up into  that region is filled and so nicely. Overall I feel like she is making great progress. 12/12/2018 patient is seen today via a telehealth visit again secondary to her concerns about coming out during the COVID-19 national pandemic. Nonetheless she appears to be doing quite well at this time which is good news. There is no signs of active infection at this time. Overall patient seems to be very pleased as is her son who takes very good care of her. 01/16/2019 on evaluation today patient actually appears to be doing very well with regard to her wound. We have been doing zoom calls quite a bit since the COVID-19 pandemic began just due  to the fact that she has been somewhat concerned about coming out. With that being said she seems to have done very well and in fact the wound is measuring quite a bit smaller today compared to the last measurement. Fortunately there is no signs of active infection at this time. She is also having great improvement overall in her demeanor she is very upbeat and pleased with how things have progressed. 02/21/2019 upon evaluation today patient actually appears to be doing very well with regard to her wound. She has been tolerating the dressing changes her sons been performing these without complication and seems to do an excellent job in my opinion. I am actually very pleased with the overall appearance. The wound is significantly smaller compared to what its been in the past by quite a bit. 03/21/19 on evaluation today patient appears to be doing excellent in regard to her wound. She's been tolerating the dressing changes without complication and there does not appear to be evidence of active infection which is good news. She is still tolerating the Dakinos Moistened gauze packing without complication. 04/18/19 on evaluation today patient is seen by way of a telehealth visit due to her concerns of being exposed to Covid-19 Virus if she comes into the clinic. Subsequently her son is present to help with the visit today. They have been performing the dressing changes specifically Dakins soloution at this point in order to help with granulation in the base of the wound. Fortunately there is no evidence of active infection currently. Her son does note that he feels like maybe some bone in the base of the wound centrally that he is somewhat concerned about. With that being said it's not entirely visible but he states that he can palpate it. Electronic Signature(s) Signed: 04/19/2019 6:44:22 PM By: Lenda Kelp PA-C Entered By: Lenda Kelp on 04/18/2019 21:40:28 Jessica [name]  ([id number]) -------------------------------------------------------------------------------- Physical Exam Details [name]. Date of Service: 04/18/2019 4:00 PM Medical Record Number: [id number] Patient Account Number: [id number] Date of Birth/Sex: [birth year] (74 y.o. F) Treating RN: Curtis Sites Primary Care Provider: Annita Brod Other Clinician: Referring Provider: Annita Brod Treating Provider/Extender: STONE III, Sirenity Shew Weeks in Treatment: 16 Constitutional Well-nourished and well-hydrated in no acute distress. Respiratory normal breathing without difficulty. Psychiatric this patient is able to make decisions and demonstrates good insight into disease process. Alert and Oriented x 3. pleasant and cooperative. Notes Patient's wound currently showed signs of fairly good granulation based on what I could see to the video feed. Fortunately there is no evidence of active infection at this point which is good news and in general I feel like she's been doing quite well with regard to overall progress. This is been slow but steady. Electronic Signature(s) Signed: 04/19/2019 6:44:22 PM By: Lenda Kelp PA-C Entered  By: Lenda Kelp on 04/18/2019 21:41:12 Jessica [name] ([id number]) -------------------------------------------------------------------------------- Physician Orders Details [name]. Date of Service: 04/18/2019 4:00 PM Medical Record Number: [id number] Patient Account Number: [id number] Date of Birth/Sex: [birth year] (74 y.o. F) Treating RN: Curtis Sites Primary Care Provider: Annita Brod Other Clinician: Referring Provider: Annita Brod Treating Provider/Extender: Linwood Dibbles, Adorian Gwynne Weeks in Treatment: 22 Verbal / Phone Orders: No Diagnosis Coding ICD-10 Coding Code Description L89.154 Pressure ulcer of sacral region, stage 4 M48.00 Spinal stenosis, site unspecified R54 Age-related physical debility G89.4  Chronic pain syndrome E78.2 Mixed hyperlipidemia L89.93 Pressure ulcer of unspecified site, stage 3 Wound Cleansing Wound #1 Medial Sacrum o Other: - please cleanse sacral wound with dakins moistened gauze - do not spray dakins on wound Anesthetic (add to Medication List) Wound #1 Medial Sacrum o Topical Lidocaine 4% cream applied to wound bed prior to debridement (In Clinic Only). Skin Barriers/Peri-Wound Care Wound #1 Medial Sacrum o Skin Prep Primary Wound Dressing Wound #1 Medial Sacrum o Silver Collagen - placed into the base of the wound o Pack wound with: - Dakin's soaked gauze Secondary Dressing Wound #1 Medial Sacrum o ABD pad - secure with tape Dressing Change Frequency Wound #1 Medial Sacrum o Change dressing twice daily. - but leave collagen in place when changed. Follow-up Appointments Wound #1 Medial Sacrum o Return Appointment in 1 month Off-Loading Wound #1 Medial Sacrum KAVYA, HAAG. ([id number]) o Turn and reposition every 2 hours Additional Orders / Instructions Wound #1 Medial Sacrum o Increase protein intake. Home Health Wound #1 Medial Sacrum o Continue Home Health Visits - [facility] o Home Health Nurse may visit PRN to address patientos wound care needs. o FACE TO FACE ENCOUNTER: MEDICARE and MEDICAID PATIENTS: I certify that this patient is under my care and that I had a face-to-face encounter that meets the physician face-to-face encounter requirements with this patient on this date. The encounter with the patient was in whole or in part for the following MEDICAL CONDITION: (primary reason for Home Healthcare) MEDICAL NECESSITY: I certify, that based on my findings, NURSING services are a medically necessary home health service. HOME BOUND STATUS: I certify that my clinical findings support that this patient is homebound (i.e., Due to illness or injury, pt requires aid of supportive devices such as crutches, cane,  wheelchairs, walkers, the use of special transportation or the assistance of another person to leave their place of residence. There is a normal inability to leave the home and doing so requires considerable and taxing effort. Other absences are for medical reasons / religious services and are infrequent or of short duration when for other reasons). o If current dressing causes regression in wound condition, may D/C ordered dressing product/s and apply Normal Saline Moist Dressing daily until next Wound Healing Center / Other MD appointment. Notify Wound Healing Center of regression in wound condition at [phone]. o Please direct any NON-WOUND related issues/requests for orders to patient's Primary Care Physician Electronic Signature(s) Signed: 04/18/2019 5:07:26 PM By: Lenda Kelp PA-C Entered By: Lenda Kelp on 04/18/2019 17:07:24 Jessica [name] ([id number]) -------------------------------------------------------------------------------- Problem List Details [name]. Date of Service: 04/18/2019 4:00 PM Medical Record Number: [id number] Patient Account Number: [id number] Date of Birth/Sex: 1945/08/29 (74 y.o. F) Treating RN: Curtis Sites Primary Care Provider: Annita Brod Other Clinician: Referring Provider: Annita Brod Treating Provider/Extender: STONE III, Vesta Wheeland Weeks in Treatment: 98 Active Problems ICD-10 Evaluated Encounter Code Description Active Date Today Diagnosis  L89.154 Pressure ulcer of sacral region, stage 4 05/27/2017 No Yes M48.00 Spinal stenosis, site unspecified 05/27/2017 No Yes R54 Age-related physical debility 05/27/2017 No Yes G89.4 Chronic pain syndrome 05/27/2017 No Yes E78.2 Mixed hyperlipidemia 05/27/2017 No Yes L89.93 Pressure ulcer of unspecified site, stage 3 05/27/2017 No Yes Inactive Problems Resolved Problems Electronic Signature(s) Signed: 04/18/2019 4:58:19 PM By: Lenda KelpStone III, Vidur Knust PA-C Entered By: Lenda KelpStone III,  Lakeyn Dokken on 04/18/2019 16:58:19 Jessica BradyVERMAN, Gisselle W. ([id number]) -------------------------------------------------------------------------------- Progress Note Details Patient Name: Jessica BradyVERMAN, Skyllar W. Date of Service: 04/18/2019 4:00 PM Medical Record Number: [id number] Patient Account Number: [id number] Date of Birth/Sex: 27-Feb-1946 [age] y.o. F) Treating RN: Curtis Sitesorthy, Joanna Primary Care Provider: Annita BrodASENSO, PHILIP Other Clinician: Referring Provider: Annita BrodASENSO, PHILIP Treating Provider/Extender: Linwood DibblesSTONE III, Reuel Lamadrid Weeks in Treatment: 98 Subjective Chief Complaint Information obtained from Patient she is here for evaluation of a sacral ulcer History of Present Illness (HPI) 05/27/17-she is here in initial evaluation for a left-sided sacral stage IV pressure ulcer and bilateral lower extremity, lateral aspect, unstageable pressure ulcers. She is accompanied by her husband and her son, who are her primary caregivers. She is bedbound secondary to spinal stenosis. According to her son and husband she was hospitalized from 10/5-11/1 with healthcare associated pneumonia and altered mental status. During her hospitalization she was intubated, extubated on 10/27. She was discharged with Foley catheter and follow up with urology. According to her son and spouse she developed the sacral ulcer during hospitalization. Home health has been applying Santyl daily. She does have a low air loss mattress and is repositioned every 2-3 hours per her family's report. According to the son and spouse she had an appointment with urology on 12/18 and during that appointment developed discoloration to her bilateral lower extremities which ultimately developed into unstageable pressure ulcers to the lateral aspects of her bilateral lower extremities. There has been no topical treatment applied to these. She continues to have home health. There is no concerns expressed regarding dietary intake, stating she eats 3 meals a day, eating  was provided; she is supplemented with boost with protein. 06/03/17-she is here in follow-up evaluation for sacral and bilateral lower extremity ulcers. Plain film x-ray done today reveals no distraction to the sacrum or coccyx, no visible abnormalities. Home health has ordered the negative pressure wound system but it has not been initiated. We will continue with Hydrofera Blue until initiation of negative pressure wound system and continue with Santyl to bilateral lower extremity ulcers. Follow-up next week 06/10/17-she is here in follow-up evaluation for sacral and bilateral lower extremity ulcers. The wound VAC will be available tomorrow per home health. We will initiate wound VAC therapy to the sacral ulcer 3 times weekly (Thursday, Saturday/Sunday, Tuesday). We will continue with Santyl to the lower extremity ulcers. The patient's son is [facility] with the understanding that if VAC changes cannot be performed over the weekend he will need to change his mother's appointments to Monday, Wednesday or Friday. The sacral ulcer clinically does not appear infected but there has been a change in the amount of drainage acutely, there is no significant amount of devitalized tissue, there is no malodor. Wound culture was obtained to evaluate for occult infection we will hold off on antibiotic therapy until sensitivities are resulted. 06/18/17 on evaluation today patient appears to be doing fairly well in my opinion although this is the first time I have seen this patient she has been previously evaluated by Tacey RuizLeah here  in our office. She is going to be switching to Fridays to see me due to the Wound VAC schedule being changed on Monday, Wednesday, and Friday. Subsequently she seems to be doing fairly well with the Wound VAC. Her son who was present during evaluation today states that he somewhat stresses of the Wound VAC in making sure that it was  functioning appropriately. With that being said everything seems to be working well he knows what settings on the [facility] itself to look at and ensure that it is functioning properly. Overall the wound appears to be nice and clean there is no need for debridement today. She has no discomfort in her bilateral lower extremity ulcers also appear to be improving based on measurements and what this honest tell me about the overall appearance. 07/02/17 on evaluation today I noted in patients wound bed that was actually an odor that had not previously been noted. Subsequently there was also a small area of bone that was not previously noted during my last evaluation. This did appear to be necrotic and was being somewhat forced out by the body around the region of granulation. She does not have any pain which is good news. Nonetheless the overall appearance of the ulcer is making me concerned for the patient having developed osteomyelitis. Currently she is not been on any antibiotics and the Wound VAC has been doing fairly well in general. With that being said I do not think we need to continue the Wound VAC if she potentially has a bone infection. At least not until it is properly addressed with antibiotics. Jessica [name], Jessica [name] ([id number]) 07/09/17 on evaluation today patient appears to actually be doing very well in regard to her sacral ulcer compared to last week. There is actually no exposed bone at this point. 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 With that being said still I do believe she needs to have a referral to infectious disease due to the osteomyelitis I am gonna recommend that she continue with the doxycycline based on the culture results which showed Eikenella Corrodens as the organism identified that tetracycline should work for. She  does not seem to be having any discomfort whatsoever at this point. 07/16/17 on evaluation today patient actually appears to be doing rather well in regard to her sacral wound. I think that the original wound site actually appears much better than previously noted. With that being said she does have a new superficial injury on the right sacral region which appears to be due to according to the sign transport that occurred for her MRI unfortunately. Fortunately this is not too deep and I do think it can be managed but it was a new area that was not previously noted. Otherwise she has been tolerating the Dakin s soaked gauze packing without complication. 07/30/17 on evaluation today patient presents for reevaluation concerning her sacral wound. She has been tolerating the dressing changes without complication. With that being said her wound is doing so well at this point that I think she may be at the point where we could reinitiate the Wound VAC currently and hopefully see good results from this. I'm very pleased with how she has responded to the [facility] s soaked gauze packing. 08/13/17 evaluation today patient appears to be doing excellent in regard to her lower extremity ulcer this seems to  be cleaning up very nicely. In regard to the sacral ulcer the area of trauma on the right lateral portion of the wound actually appears to be much better than previously noted during the last evaluation. The word has definitely filled in and the Wound VAC appears to be helpful I do believe. Overall I'm very pleased with how things seem to be progressing at this time. Patient likewise is also happy that things are doing well. 08/27/17 on evaluation today patient presents for follow-up concerning her ongoing issues with her sacral ulcer and lower extremity ulcer. Fortunately she has been tolerating the dressing changes well complication the Wound VAC in general seems to have done very well up to this point. She does not  have any evidence of infection which is good news. She does have some dark discoloration in central portion of the wound which is troublesome for the possibility of pressure injury to the site it sounds like her prior air mattress was not functioning properly her son has just bought a new one for her this is doing much better. Otherwise things seem to be progressing nicely. 09/10/17 on evaluation today patient presents for follow-up concerning her sacral wound and lower extremity ulcer. She has been tolerating the switch of her dressing changes to the Dakin s soaked gauze dressing very well in regard to the sacrum. The left lateral lower extremity ulcer appears to have a new area open just distal to the one that we have been treating with Santyl and this is new since her last visit. There does not appear to be any evidence of infection which is good news. With that being said there's a lot of maceration here at the site and I feel this is likely due to the switch and the dressings it appears that due to the sticking of the dressings it will switch to utilizing a telfa pad over the Santyl. I think this caused a lot of drainage to be collected and situated in the region just under the dressing which has led to this causing which duration of breakdown. Overall there does not appear to be any significant pain which is good news. Of note I did actually have a conversation with the radiologist who is an interventional radiologist with [facility]. 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 With that being said there does not appear to be any rush to get this done according to the interventional radiologist whom I spoke with. Therefore after discussion it was determined that there gonna wait about six months prior to considering the procedure to get time for hopefully the sacral wound to heal in the interim to at least some  degree. 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 According to nursing staff these are all improved, the negative pressure wound therapy system was put on hold last week. We will resume negative pressure wound therapy today, continue with Santyl to the lower extremity and she will follow-up next week. 09/24/17 on evaluation today unfortunately the patient's wound appears to be doing significantly worse compared to last week's evaluation and even my valuation the week before. She actually has bone noted on the left wound margin in the region of undermining unfortunately. This was not noted during the last evaluation. I am concerned about the fact that this seems to be worse not better since our last visit with her. My biggest concern  is that she's likely developing a worsening osteomyelitis. This was discussed with patient and her son today during the office visit. 05/03/17 on evaluation today patient actually appears to be doing rather well in regard to her sacral ulcer compared to last week's evaluation. She has been tolerating the dressing changes without complication. Fortunately even though we're not utilizing the Wound VAC she seems to be making some strides in seeing the wound area overall improved quite dramatically in my pinion in just one weeks time. She also seems to be staying off of this to the point that I do not see any evidence of new injury which is excellent news. Whatever she's been doing in that regard over the past week I would want her to continue. Jessica [name], Jessica [name] ([id number]) did also review the results of her bone culture which revealed group B strep as the responsible organism. Again this is what's causing her osteomyelitis she does have infectious disease appointment scheduled for 10/11/17 10/08/1898 valuation today patient appears to be doing better for the most part in regard to the sacral wound. Overall she is showing signs  of having granulation of the bone which is good news. There is an area towards the left of the wound where she does seem to be showing some signs of opening this it would be due to additional pressure to the area unfortunately. There does not appear to be any evidence of infection spreading which is good news that do not see any evidence of significant purulent discharge which is also good news. 10/26/17 on evaluation today patient sacral ulcer actually appears to be very healthy and doing well in regard to the overall appearance of the wound. With that being said she does seem to be having some issues at this point in time with some shear/friction injury of the sacrum from where she was transported to [facility] for her infectious disease appointment. She has been placed on IV antibiotic therapy I will have to go back and find that note for review as I did not have access to that today. Nonetheless I will add the next visit be sure to document the exact antibiotics that she is taking at this point. Nonetheless I do believe the wound is doing better there's no bone exposure nothing that requires debridement in regard to the sacral wound. Likewise her left lateral lower extremity ulcer also appears to be doing significantly better. In general I feel like things are showing signs of improvement all around. 11/12/17 on evaluation today patient appears to be doing rather well in regard to her sacral wound. In general I do see signs of granulation at this point. Fortunately there does not appear to be any evidence of infection which is good news as well. In general overall happy with the appearance. With that being said I'll be see I would like for this to be progressing more rapidly as with the patient and her son but nonetheless at least things do not appear to be doing worse. Her lower extremity ulcer is doing significantly better. 11/26/17 on evaluation today patient appears to be doing a little better  in regard to the sacral wound especially in the peripheral of the wound bed. She actually did have an abrasion on the right gluteal region that's completely resolved to this point. Her lower extremity also appears to be for the most part completely resolved. Overall the sacrum itself seems to be the one thing that is still open and given her trouble. No fevers,  chills, nausea, or vomiting noted at this time. She actually has an appointment next week with infectious disease for recheck to see how things are doing. She maybe switch to oral medications at that point. 12/09/17 on evaluation today patient actually appears to be doing much better in regard to her sacral wound. I do feel like she has made good progress at this time as far as healing is concerned. In fact the wound looks better to me today than it has in quite some time. She is on oral antibiotic therapy and no longer on the IV antibiotics. She did see infectious disease last week. 12/24/17 on evaluation today patient's wounds actually appear to be doing decently well in regard to the sacral wound in particular. When it comes to her lower extremity ulcers both appear to be completely healed at this point in the one area where she had lived the rash has completely resolved at this time. Overall very pleased with the progress that has been made. Nonetheless the patient seems to be doing well in general. She still does have quite a bit of healing to do in regard to the sacral wound but I feel like she is making progress. 01/07/18 on evaluation today patient actually appears to be doing excellent today regard to her sacral wound. The granular quality in the base of the wound is excellent and shown signs of good improvement there's no evidence of contusion nor deep tissue injury and the periwound actually appears to be doing well. In general I'm very pleased with the overall appearance. 02/04/18 on evaluation today patient's wound actually appears  to be doing decently well as far as the granulation is concerned. She has been tolerating the dressing changes without complication. Right now we are still using the Wound VAC. Nonetheless overall there apparently is some cater to the wound and this is concerning simply for the fact that she could be developing a soft tissue infection again. She's had this happen in the past and at that time responded very well to the antibiotics. Nonetheless I don't think I would likely put her on anything prophylactically but rather wait for the results of the culture to return. 02/18/18 on evaluation today patient actually appears to be doing fairly well. She has been tolerating the dressing changes. And the Wound VAC seems to be doing well. Overall I'm very pleased with how things have improved over the past couple weeks since I last saw her. Fortunately there does not appear to be any evidence of overt infection at this time which is good news. She has been taking the antibiotics without complication. Specifically that is the Bactrim DS which was prescribed on 02/08/18. 03/18/18 on evaluation today patient's wound actually does have quite a bit of odor noted compared to previous. With that PORSCHIA, WILLBANKS. ([id number]) being said the wound itself overall does not appear to be too significant or worse. There is no event noted in the wound bed which is good news. With that being said the patient has been worried as the home health nurse stated that she thought she saw bone in the base of the wound. Nonetheless there's a little bit of bruising along the left lateral border of the wound bed which again does not appear to be terrible we have noticed this before but other than this the majority the wound bed appears to be doing excellent in my pinion. I am wondering if there may be some bacterial colonization. This could be leading to some  of the odor that we are noting with the Wound VAC. 04/01/18 on evaluation  today patient appears to actually be doing well in regard to the overall appearance of the wound itself. She also does not have any odor at this point in regard to the wound surface and I do believe the Bacons is extremely helpful in this regard. With that being said she does have a little bit of deep tissue injury on the medial aspect of the wound. Again last time it was more lateral. The lateral seems to have improved quite well. Nonetheless she also notes by way of her son that this has been very macerated in the past several weeks since I last saw her. Though things overall seem to be doing better I still think that the Wound VAC may help with managing her fluid better I'm wondering if we can attempt a gauze Wound VAC my hope is that this will be able to manage the moisture control as well as continuing with managing the odor of the wound and bacterial load since the Dakin s has done excellent in helping in that regard. 04/15/17 on evaluation today patient actually appears to be doing much better in regard to the overall appearance of her sacral wound. I do believe that the gauze Vac has been of benefit for her. Overall she is tolerating this without complication which is also good news. There is no sign of injury to the sacral region at this point. 04/29/18 on evaluation today patient appears to be doing well in regard to her sacral ulcer. She sure signs of good granulation and the Gauze VAC is doing very well for her. Overall I'm pleased in this regard. The biggest issue is that the time from Friday till Monday seems to be quite long for her as far as the amount of time to keep the Wound VAC sealed. For that reason her son wonders if he can take this off on Sunday morning in order to give her day of just wet to dry dressing's with the Dakin's solution I think this would be okay. 05/13/18 on evaluation today patient appears to be doing okay in regard to her sacral ulcer. There does not appear to be  any evidence of overt infection at this time which is good news. In general the drainage and moisture appears to be significantly improved this was after the pressure on the Wound VAC was increased after they contacted our office. With that being said a lot of the dark tissue around the edge of the wound has improved in the maceration is significantly better this seems to be managing much better. Fortunately there is no signs of infection at this time. Overall the patient is doing quite well. The wound measurements are not significantly smaller today although they were over the past two visits we will see how things go. 05/27/18 on evaluation today patient's sacral wound actually appears to be doing fairly well at this point. She does have a deep tissue injury yet again on the medial aspect of the wound which is something that we seem to be seeing often. There is some question as to whether or not this is actually being caused by the Wound VAC bunching up and then her somewhat laying on it even though they are attempting to offload this as best as possible. Patient son states that overall they do the best they can trying offload her and I definitely believe him. I may want to see about putting the  Wound VAC on hold for a couple weeks and see if this happens just with standard dressing changes. 06/10/18 on evaluation today patient actually appears to be doing better in regard to her sacral wound. Overall there's no signs of infection, no odor, and no significant skin breakdown around the edge of the wound I feel like this is done much better 50 Dakin s soaked gauze dressing as opposed to legalizing anything such as the Wound VAC. I believe that we discontinue the Wound VAC as of today. 06/24/18 on evaluation today patient actually appears to be doing well in regard to her sacral ulcer. There's no signs of infection, no odor, and in my pinion no concerns at all. I feel like she is making good progress as  far as I'm concerned. 07/12/18 on evaluation today patient appears to be doing very well in regard to her sacral ulcer. She has been tolerating the dressing changes without complication. Fortunately there does not appear to be any evidence of active infection at this time she is doing excellent as far as I'm concerned at this point. 08/11/18 on evaluation today patient's sacral wound actually appears to be doing quite well at this point. The main issue I see is that she continues to have some maceration and skin breakdown due to moisture over the right periwound location unfortunately. We've investigated the possibility of using zinc oxide before but have not utilized it simply due to the fact that she continues to state. Her son anyways that she is allergic to this. They have not tried it recently and may be completely unrelated to the reaction she had before. Nonetheless this may be something you want to attempt will discuss that in the plan. 10/04/18 on evaluation today patient actually appears to be doing quite well in regard to her sacral wound. It's been roughly 2 months since I last seen her in that time her wound has definitely made progress. This is very slow but again nonetheless Jessica [name], Jessica [name]. ([id number]) does seem to be trending in the correct direction. Overall very pleased in this regard. No fevers, chills, nausea, or vomiting noted at this time. 11/07/2018 on evaluation today patient appears to be doing decently well with regard to her sacral wound based on what I am seeing at this point. She is seen by way of a telehealth visit due to the COVID-19 national emergency and to be honest the patient is somewhat reluctant to come out to be seen for visits which I completely understand. They feel much safer keeping her home as much as possible due to her adequate condition in general. The only issue that she has had recently she did have some wheezing and subsequently chest congestion she  was just placed on albuterol along with prednisone at this point. She seems to be doing much better even after 24 hours of the prednisone according to her son Pilar Plate. Overall they are pleased with how things seem to be progressing. 11/28/2018 upon evaluation today patient is seen by way of telehealth visit since she is still somewhat reluctant to come out due to the COVID-19 pandemic. Subsequently she seems to be doing well and infected I could not obtain actual measurements for the wound the overall appearance of the wound is dramatically improved. She does not appear to have nearly as much space at the 12:00 location and in fact her son Pilar Plate who is taking care of her mainly states that he is barely able to get anything up into that  region is filled and so nicely. Overall I feel like she is making great progress. 12/12/2018 patient is seen today via a telehealth visit again secondary to her concerns about coming out during the COVID-19 national pandemic. Nonetheless she appears to be doing quite well at this time which is good news. There is no signs of active infection at this time. Overall patient seems to be very pleased as is her son who takes very good care of her. 01/16/2019 on evaluation today patient actually appears to be doing very well with regard to her wound. We have been doing zoom calls quite a bit since the COVID-19 pandemic began just due to the fact that she has been somewhat concerned about coming out. With that being said she seems to have done very well and in fact the wound is measuring quite a bit smaller today compared to the last measurement. Fortunately there is no signs of active infection at this time. She is also having great improvement overall in her demeanor she is very upbeat and pleased with how things have progressed. 02/21/2019 upon evaluation today patient actually appears to be doing very well with regard to her wound. She has been tolerating the dressing changes  her sons been performing these without complication and seems to do an excellent job in my opinion. I am actually very pleased with the overall appearance. The wound is significantly smaller compared to what its been in the past by quite a bit. 03/21/19 on evaluation today patient appears to be doing excellent in regard to her wound. She's been tolerating the dressing changes without complication and there does not appear to be evidence of active infection which is good news. She is still tolerating the Dakin s Moistened gauze packing without complication. 04/18/19 on evaluation today patient is seen by way of a telehealth visit due to her concerns of being exposed to Covid-19 Virus if she comes into the clinic. Subsequently her son is present to help with the visit today. They have been performing the dressing changes specifically Dakins soloution at this point in order to help with granulation in the base of the wound. Fortunately there is no evidence of active infection currently. Her son does note that he feels like maybe some bone in the base of the wound centrally that he is somewhat concerned about. With that being said it's not entirely visible but he states that he can palpate it. Allergies zinc, Ambien Objective Constitutional Well-nourished and well-hydrated in no acute distress. Jessica [name], Jessica [name]. ([id number]) Respiratory normal breathing without difficulty. Psychiatric this patient is able to make decisions and demonstrates good insight into disease process. Alert and Oriented x 3. pleasant and cooperative. General Notes: Patient's wound currently showed signs of fairly good granulation based on what I could see to the video feed. Fortunately there is no evidence of active infection at this point which is good news and in general I feel like she's been doing quite well with regard to overall progress. This is been slow but steady. Assessment Active Problems ICD-10 Pressure  ulcer of sacral region, stage 4 Spinal stenosis, site unspecified Age-related physical debility Chronic pain syndrome Mixed hyperlipidemia Pressure ulcer of unspecified site, stage 3 Plan Wound Cleansing: Wound #1 Medial Sacrum: Other: - please cleanse sacral wound with dakins moistened gauze - do not spray dakins on wound Anesthetic (add to Medication List): Wound #1 Medial Sacrum: Topical Lidocaine 4% cream applied to wound bed prior to debridement (In Clinic Only). Skin Barriers/Peri-Wound Care:  Wound #1 Medial Sacrum: Skin Prep Primary Wound Dressing: Wound #1 Medial Sacrum: Silver Collagen - placed into the base of the wound Pack wound with: - Dakin's soaked gauze Secondary Dressing: Wound #1 Medial Sacrum: ABD pad - secure with tape Dressing Change Frequency: Wound #1 Medial Sacrum: Change dressing twice daily. - but leave collagen in place when changed. Follow-up Appointments: Wound #1 Medial Sacrum: Return Appointment in 1 month Off-Loading: Jessica [name], Jessica [name]. ([id number]) Wound #1 Medial Sacrum: Turn and reposition every 2 hours Additional Orders / Instructions: Wound #1 Medial Sacrum: Increase protein intake. Home Health: Wound #1 Medial Sacrum: Continue Home Health Visits - [facility] Nurse may visit PRN to address patient s wound care needs. FACE TO FACE ENCOUNTER: MEDICARE and MEDICAID PATIENTS: I certify that this patient is under my care and that I had a face-to-face encounter that meets the physician face-to-face encounter requirements with this patient on this date. The encounter with the patient was in whole or in part for the following MEDICAL CONDITION: (primary reason for Home Healthcare) MEDICAL NECESSITY: I certify, that based on my findings, NURSING services are a medically necessary home health service. HOME BOUND STATUS: I certify that my clinical findings support that this patient is homebound (i.e., Due to illness or injury, pt  requires aid of supportive devices such as crutches, cane, wheelchairs, walkers, the use of special transportation or the assistance of another person to leave their place of residence. There is a normal inability to leave the home and doing so requires considerable and taxing effort. Other absences are for medical reasons / religious services and are infrequent or of short duration when for other reasons). If current dressing causes regression in wound condition, may D/C ordered dressing product/s and apply Normal Saline Moist Dressing daily until next Wound Healing Center / Other MD appointment. Notify Wound Healing Center of regression in wound condition at [phone]. Please direct any NON-WOUND related issues/requests for orders to patient's Primary Care Physician With regard to the patient's sacral wound I think that continuing with the Dakin s moistened gauze is gonna be the appropriate way to go at this point. She has done well currently and I think it's keeping the wound nice and clean at this point. With regard to the patient's base of the wound and the fact that she does have some evidence of boat exposure this is something that I'm in a recommend we actually have her come in for an office visit the next time in order to evaluate for what may be going on in this regard and see if there's anything else needs to be done. I did drive so they can put silver collagen into the base of the wound prior to applying the additional dressings of the top which may help with additional granulation as well. In regard to continue offloading I do think the patient needs to still continue to appropriately offload trying to avoid any pressure getting to the sacral and gluteal region in general. Please see above for specific wound care orders. We will see patient for re-evaluation in 1 month(s) here in the clinic. If anything worsens or changes patient will contact our office for additional  recommendations. Electronic Signature(s) Signed: 04/19/2019 6:44:22 PM By: Lenda Kelp PA-C Entered By: Lenda Kelp on 04/18/2019 21:43:26 Jessica [name] ([id number]) -------------------------------------------------------------------------------- SuperBill Details [name]. Date of Service: 04/18/2019 Medical Record Number: [id number] Patient Account Number: [id number] Date of Birth/Sex: May 06, 1945 (74 y.o. F) Treating  RN: Curtis Sites Primary Care Provider: Annita Brod Other Clinician: Referring Provider: Annita Brod Treating Provider/Extender: STONE III, Kenneisha Cochrane Weeks in Treatment: 98 Diagnosis Coding ICD-10 Codes Code Description L89.154 Pressure ulcer of sacral region, stage 4 M48.00 Spinal stenosis, site unspecified R54 Age-related physical debility G89.4 Chronic pain syndrome E78.2 Mixed hyperlipidemia L89.93 Pressure ulcer of unspecified site, stage 3 Physician Procedures CPT4 Code: [id number] Description: 99214 - WC PHYS LEVEL 4 - EST PT ICD-10 Diagnosis Description L89.154 Pressure ulcer of sacral region, stage 4 M48.00 Spinal stenosis, site unspecified R54 Age-related physical debility G89.4 Chronic pain syndrome Modifier: Quantity: 1 Electronic Signature(s) Signed: 04/19/2019 6:44:22 PM By: Lenda Kelp PA-C Entered By: Lenda Kelp on 04/18/2019 21:43:54

## 2019-05-16 ENCOUNTER — Ambulatory Visit: Payer: Medicare Other | Admitting: Physician Assistant

## 2019-05-25 ENCOUNTER — Other Ambulatory Visit: Payer: Self-pay

## 2019-05-25 ENCOUNTER — Encounter: Payer: Medicare Other | Attending: Physician Assistant | Admitting: Physician Assistant

## 2019-05-25 DIAGNOSIS — G894 Chronic pain syndrome: Secondary | ICD-10-CM | POA: Diagnosis not present

## 2019-05-25 DIAGNOSIS — R54 Age-related physical debility: Secondary | ICD-10-CM | POA: Diagnosis not present

## 2019-05-25 DIAGNOSIS — M4800 Spinal stenosis, site unspecified: Secondary | ICD-10-CM | POA: Diagnosis not present

## 2019-05-25 DIAGNOSIS — E782 Mixed hyperlipidemia: Secondary | ICD-10-CM | POA: Insufficient documentation

## 2019-05-25 DIAGNOSIS — L89154 Pressure ulcer of sacral region, stage 4: Secondary | ICD-10-CM | POA: Insufficient documentation

## 2019-05-25 NOTE — Progress Notes (Addendum)
VERTIE, DIBBERN ([id number]) Visit Report for 05/25/2019 Chief Complaint Document Details [name]. Date of Service: 05/25/2019 10:45 AM Medical Record Number: [id number] Patient Account Number: [id number] Date of Birth/Sex: [birth year] (74 y.o. F) Treating RN: Rodell Perna Primary Care Provider: Annita Brod Other Clinician: Referring Provider: Annita Brod Treating Provider/Extender: Linwood Dibbles, Cathy Crounse Weeks in Treatment: 104 Information Obtained from: Patient Chief Complaint she is here for evaluation of a sacral ulcer Electronic Signature(s) Signed: 05/25/2019 11:08:52 AM By: Lenda Kelp PA-C Entered By: Lenda Kelp on 05/25/2019 11:08:51 Jessica [name] ([id number]) -------------------------------------------------------------------------------- HPI Details [name]. Date of Service: 05/25/2019 10:45 AM Medical Record Number: [id number] Patient Account Number: [id number] Date of Birth/Sex: Mar 26, 1946 (74 y.o. F) Treating RN: Rodell Perna Primary Care Provider: Annita Brod Other Clinician: Referring Provider: Annita Brod Treating Provider/Extender: Linwood Dibbles, Zyanna Leisinger Weeks in Treatment: 104 History of Present Illness HPI Description: 05/27/17-she is here in initial evaluation for a left-sided sacral stage IV pressure ulcer and bilateral lower extremity, lateral aspect, unstageable pressure ulcers. She is accompanied by her husband and her son, who are her primary caregivers. She is bedbound secondary to spinal stenosis. According to her son and husband she was hospitalized from 10/5-11/1 with healthcare associated pneumonia and altered mental status. During her hospitalization she was intubated, extubated on 10/27. She was discharged with Foley catheter and follow up with urology. According to her son and spouse she developed the sacral ulcer during hospitalization. Home health has been applying Santyl daily. She does  have a low air loss mattress and is repositioned every 2-3 hours per her family's report. According to the son and spouse she had an appointment with urology on 12/18 and during that appointment developed discoloration to her bilateral lower extremities which ultimately developed into unstageable pressure ulcers to the lateral aspects of her bilateral lower extremities. There has been no topical treatment applied to these. She continues to have home health. There is no concerns expressed regarding dietary intake, stating she eats 3 meals a day, eating was provided; she is supplemented with boost with protein. 06/03/17-she is here in follow-up evaluation for sacral and bilateral lower extremity ulcers. Plain film x-ray done today reveals no distraction to the sacrum or coccyx, no visible abnormalities. Home health has ordered the negative pressure wound system but it has not been initiated. We will continue with Hydrofera Blue until initiation of negative pressure wound system and continue with Santyl to bilateral lower extremity ulcers. Follow-up next week 06/10/17-she is here in follow-up evaluation for sacral and bilateral lower extremity ulcers. The wound VAC will be available tomorrow per home health. We will initiate wound VAC therapy to the sacral ulcer 3 times weekly (Thursday, Saturday/Sunday, Tuesday). We will continue with Santyl to the lower extremity ulcers. The patient's son is checking into home health therapy over the weekend for [facility] changes, with the understanding that if VAC changes cannot be performed over the weekend he will need to change his mother's appointments to Monday, Wednesday or Friday. The sacral ulcer clinically does not appear infected but there has been a change in the amount of drainage acutely, there is no significant amount of devitalized tissue, there is no malodor. Wound culture was obtained to evaluate for occult infection we will hold off on antibiotic therapy  until sensitivities are resulted. 06/18/17 on evaluation today patient appears to be doing fairly well in my opinion although this is the first time I have seen this patient she  has been previously evaluated by Denny Peon here in our office. She is going to be switching to Fridays to see me due to the Wound VAC schedule being changed on Monday, Wednesday, and Friday. Subsequently she seems to be doing fairly well with the Wound VAC. Her son who was present during evaluation today states that he somewhat stresses of the Wound VAC in making sure that it was functioning appropriately. With that being said everything seems to be working well he knows what settings on the [facility] itself to look at and ensure that it is functioning properly. Overall the wound appears to be nice and clean there is no need for debridement today. She has no discomfort in her bilateral lower extremity ulcers also appear to be improving based on measurements and what this honest tell me about the overall appearance. 07/02/17 on evaluation today I noted in patients wound bed that was actually an odor that had not previously been noted. Subsequently there was also a small area of bone that was not previously noted during my last evaluation. This did appear to be necrotic and was being somewhat forced out by the body around the region of granulation. She does not have any pain which is good news. Nonetheless the overall appearance of the ulcer is making me concerned for the patient having developed osteomyelitis. Currently she is not been on any antibiotics and the Wound VAC has been doing fairly well in general. With that being said I do not think we need to continue the Wound VAC if she potentially has a bone infection. At least not until it is properly addressed with antibiotics. 07/09/17 on evaluation today patient appears to actually be doing very well in regard to her sacral ulcer compared to last week. There is actually no exposed bone  at this point. 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 With that being said still I do believe she needs to have a referral to infectious disease due to the osteomyelitis I am gonna recommend that she continue with the doxycycline based on the culture results which showed Eikenella Corrodens as the DEZIRAE, SERVICE. ([id number]) organism identified that tetracycline should work for. She does not seem to be having any discomfort whatsoever at this point. 07/16/17 on evaluation today patient actually appears to be doing rather well in regard to her sacral wound. I think that the original wound site actually appears much better than previously noted. With that being said she does have a new superficial injury on the right sacral region which appears to be due to according to the sign transport that occurred for her MRI unfortunately. Fortunately this is not too deep and I do think it can be managed but it was a new area that was not previously noted. Otherwise she has been tolerating the Dakinos soaked gauze packing without complication. 07/30/17 on evaluation today patient presents for reevaluation concerning her sacral wound. She has been tolerating the dressing changes without complication. With that being said her wound is doing so well at this point that I think she may be at the point where we could reinitiate the Wound VAC currently and hopefully see good results from this. I'm very pleased with how she has responded to the Dakinos soaked gauze packing. 08/13/17 evaluation today patient appears to be doing excellent in regard to her lower extremity  ulcer this seems to be cleaning up very nicely. In regard to the sacral ulcer the area of trauma on the right lateral portion of the wound actually appears to be much better than previously noted  during the last evaluation. The word has definitely filled in and the Wound VAC appears to be helpful I do believe. Overall I'm very pleased with how things seem to be progressing at this time. Patient likewise is also happy that things are doing well. 08/27/17 on evaluation today patient presents for follow-up concerning her ongoing issues with her sacral ulcer and lower extremity ulcer. Fortunately she has been tolerating the dressing changes well complication the Wound VAC in general seems to have done very well up to this point. She does not have any evidence of infection which is good news. She does have some dark discoloration in central portion of the wound which is troublesome for the possibility of pressure injury to the site it sounds like her prior air mattress was not functioning properly her son has just bought a new one for her this is doing much better. Otherwise things seem to be progressing nicely. 09/10/17 on evaluation today patient presents for follow-up concerning her sacral wound and lower extremity ulcer. She has been tolerating the switch of her dressing changes to the Dakinos soaked gauze dressing very well in regard to the sacrum. The left lateral lower extremity ulcer appears to have a new area open just distal to the one that we have been treating with Santyl and this is new since her last visit. There does not appear to be any evidence of infection which is good news. With that being said there's a lot of maceration here at the site and I feel this is likely due to the switch and the dressings it appears that due to the sticking of the dressings it will switch to utilizing a telfa pad over the Santyl. I think this caused a lot of drainage to be collected and situated in the region just under the dressing which has led to this causing which duration of breakdown. Overall there does not appear to be any significant pain which is good news. Of note I did actually have a  conversation with the radiologist who is an interventional radiologist with [facility]. 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 With that being said there does not appear to be any rush to get this done according to the interventional radiologist whom I spoke with. Therefore after discussion it was determined that there gonna wait about six months prior to considering the procedure to get time for hopefully the sacral wound to heal in the interim to at least some degree. 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 According to nursing staff these are all improved, the negative pressure wound therapy system was put on hold last week. We will resume negative pressure wound therapy today, continue with Santyl to the lower extremity and she will follow-up next week. 09/24/17 on evaluation today unfortunately the patient's wound appears to be doing significantly worse compared to last week's evaluation and even my valuation the week before. She actually has bone noted on the left wound margin in the region of undermining unfortunately. This was not noted during the last evaluation. I am concerned about the fact that this seems to be worse not better since our last visit with her.  My biggest concern is that she's likely developing a worsening osteomyelitis. This was discussed with patient and her son today during the office visit. 05/03/17 on evaluation today patient actually appears to be doing rather well in regard to her sacral ulcer compared to last week's evaluation. She has been tolerating the dressing changes without complication. Fortunately even though we're not utilizing the Wound VAC she seems to be making some strides in seeing the wound area overall improved quite dramatically in my pinion in just one weeks time. She also seems to be staying off of this to the point  that I do not see any evidence of new injury which is excellent news. Whatever she's been doing in that regard over the past week I would want her to continue. I did also review the results of her bone culture which revealed group B strep as the responsible organism. Again this is what's causing her osteomyelitis she does have infectious disease appointment scheduled for 10/11/17 10/08/1898 valuation today patient appears to be doing better for the most part in regard to the sacral wound. Overall she is showing signs of having granulation of the bone which is good news. There is an area towards the left of the wound where she does seem to be showing some signs of opening this it would be due to additional pressure to the area unfortunately. RAMANDA, PAULES. ([id number]) There does not appear to be any evidence of infection spreading which is good news that do not see any evidence of significant purulent discharge which is also good news. 10/26/17 on evaluation today patient sacral ulcer actually appears to be very healthy and doing well in regard to the overall appearance of the wound. With that being said she does seem to be having some issues at this point in time with some shear/friction injury of the sacrum from where she was transported to [facility] for her infectious disease appointment. She has been placed on IV antibiotic therapy I will have to go back and find that note for review as I did not have access to that today. Nonetheless I will add the next visit be sure to document the exact antibiotics that she is taking at this point. Nonetheless I do believe the wound is doing better there's no bone exposure nothing that requires debridement in regard to the sacral wound. Likewise her left lateral lower extremity ulcer also appears to be doing significantly better. In general I feel like things are showing signs of improvement all around. 11/12/17 on evaluation today patient appears to be doing  rather well in regard to her sacral wound. In general I do see signs of granulation at this point. Fortunately there does not appear to be any evidence of infection which is good news as well. In general overall happy with the appearance. With that being said I'll be see I would like for this to be progressing more rapidly as with the patient and her son but nonetheless at least things do not appear to be doing worse. Her lower extremity ulcer is doing significantly better. 11/26/17 on evaluation today patient appears to be doing a little better in regard to the sacral wound especially in the peripheral of the wound bed. She actually did have an abrasion on the right gluteal region that's completely resolved to this point. Her lower extremity also appears to be for the most part completely resolved. Overall the sacrum itself seems to be the one thing that is still open and given her  trouble. No fevers, chills, nausea, or vomiting noted at this time. She actually has an appointment next week with infectious disease for recheck to see how things are doing. She maybe switch to oral medications at that point. 12/09/17 on evaluation today patient actually appears to be doing much better in regard to her sacral wound. I do feel like she has made good progress at this time as far as healing is concerned. In fact the wound looks better to me today than it has in quite some time. She is on oral antibiotic therapy and no longer on the IV antibiotics. She did see infectious disease last week. 12/24/17 on evaluation today patient's wounds actually appear to be doing decently well in regard to the sacral wound in particular. When it comes to her lower extremity ulcers both appear to be completely healed at this point in the one area where she had lived the rash has completely resolved at this time. Overall very pleased with the progress that has been made. Nonetheless the patient seems to be doing well in general.  She still does have quite a bit of healing to do in regard to the sacral wound but I feel like she is making progress. 01/07/18 on evaluation today patient actually appears to be doing excellent today regard to her sacral wound. The granular quality in the base of the wound is excellent and shown signs of good improvement there's no evidence of contusion nor deep tissue injury and the periwound actually appears to be doing well. In general I'm very pleased with the overall appearance. 02/04/18 on evaluation today patient's wound actually appears to be doing decently well as far as the granulation is concerned. She has been tolerating the dressing changes without complication. Right now we are still using the Wound VAC. Nonetheless overall there apparently is some cater to the wound and this is concerning simply for the fact that she could be developing a soft tissue infection again. She's had this happen in the past and at that time responded very well to the antibiotics. Nonetheless I don't think I would likely put her on anything prophylactically but rather wait for the results of the culture to return. 02/18/18 on evaluation today patient actually appears to be doing fairly well. She has been tolerating the dressing changes. And the Wound VAC seems to be doing well. Overall I'm very pleased with how things have improved over the past couple weeks since I last saw her. Fortunately there does not appear to be any evidence of overt infection at this time which is good news. She has been taking the antibiotics without complication. Specifically that is the Bactrim DS which was prescribed on 02/08/18. 03/18/18 on evaluation today patient's wound actually does have quite a bit of odor noted compared to previous. With that being said the wound itself overall does not appear to be too significant or worse. There is no event noted in the wound bed which is good news. With that being said the patient has  been worried as the home health nurse stated that she thought she saw bone in the base of the wound. Nonetheless there's a little bit of bruising along the left lateral border of the wound bed which again does not appear to be terrible we have noticed this before but other than this the majority the wound bed appears to be doing excellent in my pinion. I am wondering if there may be some bacterial colonization. This could be leading to some  of the odor that we are noting with the Wound VAC. BERENISE, HUNTON ([id number]) 04/01/18 on evaluation today patient appears to actually be doing well in regard to the overall appearance of the wound itself. She also does not have any odor at this point in regard to the wound surface and I do believe the Bacons is extremely helpful in this regard. With that being said she does have a little bit of deep tissue injury on the medial aspect of the wound. Again last time it was more lateral. The lateral seems to have improved quite well. Nonetheless she also notes by way of her son that this has been very macerated in the past several weeks since I last saw her. Though things overall seem to be doing better I still think that the Wound VAC may help with managing her fluid better I'm wondering if we can attempt a gauze Wound VAC my hope is that this will be able to manage the moisture control as well as continuing with managing the odor of the wound and bacterial load since the Dakinos has done excellent in helping in that regard. 04/15/17 on evaluation today patient actually appears to be doing much better in regard to the overall appearance of her sacral wound. I do believe that the gauze Vac has been of benefit for her. Overall she is tolerating this without complication which is also good news. There is no sign of injury to the sacral region at this point. 04/29/18 on evaluation today patient appears to be doing well in regard to her sacral ulcer. She sure signs of  good granulation and the Gauze VAC is doing very well for her. Overall I'm pleased in this regard. The biggest issue is that the time from Friday till Monday seems to be quite long for her as far as the amount of time to keep the Wound VAC sealed. For that reason her son wonders if he can take this off on Sunday morning in order to give her day of just wet to dry dressing's with the Dakin's solution I think this would be okay. 05/13/18 on evaluation today patient appears to be doing okay in regard to her sacral ulcer. There does not appear to be any evidence of overt infection at this time which is good news. In general the drainage and moisture appears to be significantly improved this was after the pressure on the Wound VAC was increased after they contacted our office. With that being said a lot of the dark tissue around the edge of the wound has improved in the maceration is significantly better this seems to be managing much better. Fortunately there is no signs of infection at this time. Overall the patient is doing quite well. The wound measurements are not significantly smaller today although they were over the past two visits we will see how things go. 05/27/18 on evaluation today patient's sacral wound actually appears to be doing fairly well at this point. She does have a deep tissue injury yet again on the medial aspect of the wound which is something that we seem to be seeing often. There is some question as to whether or not this is actually being caused by the Wound VAC bunching up and then her somewhat laying on it even though they are attempting to offload this as best as possible. Patient son states that overall they do the best they can trying offload her and I definitely believe him. I may want to see about  putting the Wound VAC on hold for a couple weeks and see if this happens just with standard dressing changes. 06/10/18 on evaluation today patient actually appears to be doing  better in regard to her sacral wound. Overall there's no signs of infection, no odor, and no significant skin breakdown around the edge of the wound I feel like this is done much better 50 Dakinos soaked gauze dressing as opposed to legalizing anything such as the Wound VAC. I believe that we discontinue the Wound VAC as of today. 06/24/18 on evaluation today patient actually appears to be doing well in regard to her sacral ulcer. There's no signs of infection, no odor, and in my pinion no concerns at all. I feel like she is making good progress as far as I'm concerned. 07/12/18 on evaluation today patient appears to be doing very well in regard to her sacral ulcer. She has been tolerating the dressing changes without complication. Fortunately there does not appear to be any evidence of active infection at this time she is doing excellent as far as I'm concerned at this point. 08/11/18 on evaluation today patient's sacral wound actually appears to be doing quite well at this point. The main issue I see is that she continues to have some maceration and skin breakdown due to moisture over the right periwound location unfortunately. We've investigated the possibility of using zinc oxide before but have not utilized it simply due to the fact that she continues to state. Her son anyways that she is allergic to this. They have not tried it recently and may be completely unrelated to the reaction she had before. Nonetheless this may be something you want to attempt will discuss that in the plan. 10/04/18 on evaluation today patient actually appears to be doing quite well in regard to her sacral wound. It's been roughly 2 months since I last seen her in that time her wound has definitely made progress. This is very slow but again nonetheless does seem to be trending in the correct direction. Overall very pleased in this regard. No fevers, chills, nausea, or vomiting noted at this time. 11/07/2018 on evaluation  today patient appears to be doing decently well with regard to her sacral wound based on what I am seeing at this point. She is seen by way of a telehealth visit due to the COVID-19 national emergency and to be honest the patient is somewhat reluctant to come out to be seen for visits which I completely understand. They feel much safer keeping Jessica BradyOVERMAN, Renesmae W. ([id number]) her home as much as possible due to her adequate condition in general. The only issue that she has had recently she did have some wheezing and subsequently chest congestion she was just placed on albuterol along with prednisone at this point. She seems to be doing much better even after 24 hours of the prednisone according to her son [name]. Overall they are pleased with how things seem to be progressing. 11/28/2018 upon evaluation today patient is seen by way of telehealth visit since she is still somewhat reluctant to come out due to the COVID-19 pandemic. Subsequently she seems to be doing well and infected I could not obtain actual measurements for the wound the overall appearance of the wound is dramatically improved. She does not appear to have nearly as much space at the 12:00 location and in fact her son [name] who is taking care of her mainly states that he is barely able to get anything up into  that region is filled and so nicely. Overall I feel like she is making great progress. 12/12/2018 patient is seen today via a telehealth visit again secondary to her concerns about coming out during the COVID-19 national pandemic. Nonetheless she appears to be doing quite well at this time which is good news. There is no signs of active infection at this time. Overall patient seems to be very pleased as is her son who takes very good care of her. 01/16/2019 on evaluation today patient actually appears to be doing very well with regard to her wound. We have been doing zoom calls quite a bit since the COVID-19 pandemic began just due  to the fact that she has been somewhat concerned about coming out. With that being said she seems to have done very well and in fact the wound is measuring quite a bit smaller today compared to the last measurement. Fortunately there is no signs of active infection at this time. She is also having great improvement overall in her demeanor she is very upbeat and pleased with how things have progressed. 02/21/2019 upon evaluation today patient actually appears to be doing very well with regard to her wound. She has been tolerating the dressing changes her sons been performing these without complication and seems to do an excellent job in my opinion. I am actually very pleased with the overall appearance. The wound is significantly smaller compared to what its been in the past by quite a bit. 03/21/19 on evaluation today patient appears to be doing excellent in regard to her wound. She's been tolerating the dressing changes without complication and there does not appear to be evidence of active infection which is good news. She is still tolerating the Dakinos Moistened gauze packing without complication. 04/18/19 on evaluation today patient is seen by way of a telehealth visit due to her concerns of being exposed to Covid-19 Virus if she comes into the clinic. Subsequently her son is present to help with the visit today. They have been performing the dressing changes specifically Dakins soloution at this point in order to help with granulation in the base of the wound. Fortunately there is no evidence of active infection currently. Her son does note that he feels like maybe some bone in the base of the wound centrally that he is somewhat concerned about. With that being said it's not entirely visible but he states that he can palpate it. 05/25/2019 upon evaluation today patient appears to be doing very well in fact in regard to her wounds. I am very pleased with how things seem to be progressing and  though she is not showing any signs of infection I do believe that it may be time to switch the dressing to something a little different I think she has a lot of irritation in the periwound and this is likely coming some from the fact that she does have a lot of moisture. I think the wound is small enough we can see about initiation of a silver alginate dressing. I think this will help keep the periwound from breaking down. Overall however I am very pleased with how things stand. Electronic Signature(s) Signed: 05/25/2019 5:59:46 PM By: Lenda Kelp PA-C Entered By: Lenda Kelp on 05/25/2019 17:59:46 Jessica [name] ([id number]) -------------------------------------------------------------------------------- Physical Exam Details [name]. Date of Service: 05/25/2019 10:45 AM Medical Record Number: [id number] Patient Account Number: [id number] Date of Birth/Sex: 1945-05-20 (74 y.o. F) Treating RN: Rodell Perna Primary Care Provider:  ASENSO, PHILIP Other Clinician: Referring Provider: Annita Brod Treating Provider/Extender: STONE III, Sparkles Mcneely Weeks in Treatment: 104 Constitutional Obese and well-hydrated in no acute distress. Respiratory normal breathing without difficulty. Psychiatric this patient is able to make decisions and demonstrates good insight into disease process. Alert and Oriented x 3. pleasant and cooperative. Notes Patient's wound bed currently showed signs of good granulation there was no direct bone exposure though she does have some bone close to the surface and central portion of the wound that is been the case all along this is really not a significant change but again we really want to see more granulation to be honest. I do believe that if we switch away from the Dakin's just have them cleaned with this and then use a silver alginate packed into the wound bed she may see an derive additional benefit. Electronic Signature(s) Signed:  05/25/2019 6:00:15 PM By: Lenda Kelp PA-C Entered By: Lenda Kelp on 05/25/2019 18:00:14 Jessica [name] ([id number]) -------------------------------------------------------------------------------- Physician Orders Details [name]. Date of Service: 05/25/2019 10:45 AM Medical Record Number: [id number] Patient Account Number: [id number] Date of Birth/Sex: 1946/04/12 (74 y.o. F) Treating RN: Rodell Perna Primary Care Provider: Annita Brod Other Clinician: Referring Provider: Annita Brod Treating Provider/Extender: Linwood Dibbles, Shevaun Lovan Weeks in Treatment: 104 Verbal / Phone Orders: No Diagnosis Coding ICD-10 Coding Code Description L89.154 Pressure ulcer of sacral region, stage 4 M48.00 Spinal stenosis, site unspecified R54 Age-related physical debility G89.4 Chronic pain syndrome E78.2 Mixed hyperlipidemia L89.93 Pressure ulcer of unspecified site, stage 3 Wound Cleansing Wound #1 Medial Sacrum o Other: - please cleanse sacral wound with dakins moistened gauze - do not spray dakins on wound Anesthetic (add to Medication List) Wound #1 Medial Sacrum o Topical Lidocaine 4% cream applied to wound bed prior to debridement (In Clinic Only). Skin Barriers/Peri-Wound Care Wound #1 Medial Sacrum o Skin Prep Primary Wound Dressing Wound #1 Medial Sacrum o Alginate - with dry gauze behind alginate to fill space Secondary Dressing Wound #1 Medial Sacrum o ABD pad - secure with tape Dressing Change Frequency Wound #1 Medial Sacrum o Change dressing twice daily. Follow-up Appointments Wound #1 Medial Sacrum o Return Appointment in 2 weeks. Off-Loading Wound #1 Medial Sacrum o Turn and reposition every 2 hours Girgis, Riyah W. ([id number]) Additional Orders / Instructions Wound #1 Medial Sacrum o Increase protein intake. Home Health Wound #1 Medial Sacrum o Continue Home Health Visits - [facility] o Home Health Nurse may  visit PRN to address patientos wound care needs. o FACE TO FACE ENCOUNTER: MEDICARE and MEDICAID PATIENTS: I certify that this patient is under my care and that I had a face-to-face encounter that meets the physician face-to-face encounter requirements with this patient on this date. The encounter with the patient was in whole or in part for the following MEDICAL CONDITION: (primary reason for Home Healthcare) MEDICAL NECESSITY: I certify, that based on my findings, NURSING services are a medically necessary home health service. HOME BOUND STATUS: I certify that my clinical findings support that this patient is homebound (i.e., Due to illness or injury, pt requires aid of supportive devices such as crutches, cane, wheelchairs, walkers, the use of special transportation or the assistance of another person to leave their place of residence. There is a normal inability to leave the home and doing so requires considerable and taxing effort. Other absences are for medical reasons / religious services and are infrequent or of short duration when for other reasons). o  If current dressing causes regression in wound condition, may D/C ordered dressing product/s and apply Normal Saline Moist Dressing daily until next Wound Healing Center / Other MD appointment. Notify Wound Healing Center of regression in wound condition at [phone]. o Please direct any NON-WOUND related issues/requests for orders to patient's Primary Care Physician Electronic Signature(s) Signed: 05/25/2019 11:47:46 AM By: Rodell PernaScott, Dajea Signed: 05/25/2019 6:17:56 PM By: Lenda KelpStone III, Tryone Kille PA-C Entered By: Rodell PernaScott, Dajea on 05/25/2019 11:44:23 Jessica BradyVERMAN, Moriah W. ([id number]) -------------------------------------------------------------------------------- Problem List Details Patient Name: Jessica BradyVERMAN, Chaunice W. Date of Service: 05/25/2019 10:45 AM Medical Record Number: [id number] Patient Account Number: [id number] Date of Birth/Sex:  June 20, 1945 [age] y.o. F) Treating RN: Rodell PernaScott, Dajea Primary Care Provider: Annita BrodASENSO, PHILIP Other Clinician: Referring Provider: Annita BrodASENSO, PHILIP Treating Provider/Extender: Linwood DibblesSTONE III, Jozelynn Danielson Weeks in Treatment: 104 Active Problems ICD-10 Evaluated Encounter Code Description Active Date Today Diagnosis L89.154 Pressure ulcer of sacral region, stage 4 05/27/2017 No Yes M48.00 Spinal stenosis, site unspecified 05/27/2017 No Yes R54 Age-related physical debility 05/27/2017 No Yes G89.4 Chronic pain syndrome 05/27/2017 No Yes E78.2 Mixed hyperlipidemia 05/27/2017 No Yes L89.93 Pressure ulcer of unspecified site, stage 3 05/27/2017 No Yes Inactive Problems Resolved Problems Electronic Signature(s) Signed: 05/25/2019 11:08:45 AM By: Lenda KelpStone III, Chrystopher Stangl PA-C Entered By: Lenda KelpStone III, Jessah Danser on 05/25/2019 11:08:44 Jessica BradyVERMAN, Ary W. ([id number]) -------------------------------------------------------------------------------- Progress Note Details Patient Name: Jessica BradyVERMAN, Kesha W. Date of Service: 05/25/2019 10:45 AM Medical Record Number: [id number] Patient Account Number: [id number] Date of Birth/Sex: June 20, 1945 [age] y.o. F) Treating RN: Rodell PernaScott, Dajea Primary Care Provider: Annita BrodASENSO, PHILIP Other Clinician: Referring Provider: Annita BrodASENSO, PHILIP Treating Provider/Extender: Linwood DibblesSTONE III, Korrina Zern Weeks in Treatment: 104 Subjective Chief Complaint Information obtained from Patient she is here for evaluation of a sacral ulcer History of Present Illness (HPI) 05/27/17-she is here in initial evaluation for a left-sided sacral stage IV pressure ulcer and bilateral lower extremity, lateral aspect, unstageable pressure ulcers. She is accompanied by her husband and her son, who are her primary caregivers. She is bedbound secondary to spinal stenosis. According to her son and husband she was hospitalized from 10/5-11/1 with healthcare associated pneumonia and altered mental status. During her hospitalization she was intubated,  extubated on 10/27. She was discharged with Foley catheter and follow up with urology. According to her son and spouse she developed the sacral ulcer during hospitalization. Home health has been applying Santyl daily. She does have a low air loss mattress and is repositioned every 2-3 hours per her family's report. According to the son and spouse she had an appointment with urology on 12/18 and during that appointment developed discoloration to her bilateral lower extremities which ultimately developed into unstageable pressure ulcers to the lateral aspects of her bilateral lower extremities. There has been no topical treatment applied to these. She continues to have home health. There is no concerns expressed regarding dietary intake, stating she eats 3 meals a day, eating was provided; she is supplemented with boost with protein. 06/03/17-she is here in follow-up evaluation for sacral and bilateral lower extremity ulcers. Plain film x-ray done today reveals no distraction to the sacrum or coccyx, no visible abnormalities. Home health has ordered the negative pressure wound system but it has not been initiated. We will continue with Hydrofera Blue until initiation of negative pressure wound system and continue with Santyl to bilateral lower extremity ulcers. Follow-up next week 06/10/17-she is here in follow-up evaluation for sacral and bilateral lower extremity ulcers. The wound VAC will be available tomorrow per home health. We will initiate wound  VAC therapy to the sacral ulcer 3 times weekly (Thursday, Saturday/Sunday, Tuesday). We will continue with Santyl to the lower extremity ulcers. The patient's son is checking into home health therapy over the weekend for [facility] changes, with the understanding that if VAC changes cannot be performed over the weekend he will need to change his mother's appointments to Monday, Wednesday or Friday. The sacral ulcer clinically does not appear infected but there  has been a change in the amount of drainage acutely, there is no significant amount of devitalized tissue, there is no malodor. Wound culture was obtained to evaluate for occult infection we will hold off on antibiotic therapy until sensitivities are resulted. 06/18/17 on evaluation today patient appears to be doing fairly well in my opinion although this is the first time I have seen this patient she has been previously evaluated by Tacey Ruiz here in our office. She is going to be switching to Fridays to see me due to the Wound VAC schedule being changed on Monday, Wednesday, and Friday. Subsequently she seems to be doing fairly well with the Wound VAC. Her son who was present during evaluation today states that he somewhat stresses of the Wound VAC in making sure that it was functioning appropriately. With that being said everything seems to be working well he knows what settings on the [facility] itself to look at and ensure that it is functioning properly. Overall the wound appears to be nice and clean there is no need for debridement today. She has no discomfort in her bilateral lower extremity ulcers also appear to be improving based on measurements and what this honest tell me about the overall appearance. 07/02/17 on evaluation today I noted in patients wound bed that was actually an odor that had not previously been noted. Subsequently there was also a small area of bone that was not previously noted during my last evaluation. This did appear to be necrotic and was being somewhat forced out by the body around the region of granulation. She does not have any pain which is good news. Nonetheless the overall appearance of the ulcer is making me concerned for the patient having developed osteomyelitis. Currently she is not been on any antibiotics and the Wound VAC has been doing fairly well in general. With that being said I do not think we need to continue the Wound VAC if she potentially has a bone  infection. At least not until it is properly addressed with antibiotics. BERA, PINELA ([id number]) 07/09/17 on evaluation today patient appears to actually be doing very well in regard to her sacral ulcer compared to last week. There is actually no exposed bone at this point. 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 With that being said still I do believe she needs to have a referral to infectious disease due to the osteomyelitis I am gonna recommend that she continue with the doxycycline based on the culture results which showed Eikenella Corrodens as the organism identified that tetracycline should work for. She does not seem to be having any discomfort whatsoever at this point. 07/16/17 on evaluation today patient actually appears to be doing rather well in regard to her sacral wound. I think that the original wound site actually appears much better than previously noted. With that being said she does have a new superficial injury on the right sacral  region which appears to be due to according to the sign transport that occurred for her MRI unfortunately. Fortunately this is not too deep and I do think it can be managed but it was a new area that was not previously noted. Otherwise she has been tolerating the Dakin s soaked gauze packing without complication. 07/30/17 on evaluation today patient presents for reevaluation concerning her sacral wound. She has been tolerating the dressing changes without complication. With that being said her wound is doing so well at this point that I think she may be at the point where we could reinitiate the Wound VAC currently and hopefully see good results from this. I'm very pleased with how she has responded to the [facility] s soaked gauze packing. 08/13/17 evaluation today patient appears to be doing  excellent in regard to her lower extremity ulcer this seems to be cleaning up very nicely. In regard to the sacral ulcer the area of trauma on the right lateral portion of the wound actually appears to be much better than previously noted during the last evaluation. The word has definitely filled in and the Wound VAC appears to be helpful I do believe. Overall I'm very pleased with how things seem to be progressing at this time. Patient likewise is also happy that things are doing well. 08/27/17 on evaluation today patient presents for follow-up concerning her ongoing issues with her sacral ulcer and lower extremity ulcer. Fortunately she has been tolerating the dressing changes well complication the Wound VAC in general seems to have done very well up to this point. She does not have any evidence of infection which is good news. She does have some dark discoloration in central portion of the wound which is troublesome for the possibility of pressure injury to the site it sounds like her prior air mattress was not functioning properly her son has just bought a new one for her this is doing much better. Otherwise things seem to be progressing nicely. 09/10/17 on evaluation today patient presents for follow-up concerning her sacral wound and lower extremity ulcer. She has been tolerating the switch of her dressing changes to the Dakin s soaked gauze dressing very well in regard to the sacrum. The left lateral lower extremity ulcer appears to have a new area open just distal to the one that we have been treating with Santyl and this is new since her last visit. There does not appear to be any evidence of infection which is good news. With that being said there's a lot of maceration here at the site and I feel this is likely due to the switch and the dressings it appears that due to the sticking of the dressings it will switch to utilizing a telfa pad over the Santyl. I think this caused a lot of  drainage to be collected and situated in the region just under the dressing which has led to this causing which duration of breakdown. Overall there does not appear to be any significant pain which is good news. Of note I did actually have a conversation with the radiologist who is an interventional radiologist with [facility]. 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 With that being said there does not appear to be any rush to get this done according to the interventional radiologist whom I spoke with. Therefore after discussion it was determined that there gonna wait about six months prior to considering the  procedure to get time for hopefully the sacral wound to heal in the interim to at least some degree. 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 According to nursing staff these are all improved, the negative pressure wound therapy system was put on hold last week. We will resume negative pressure wound therapy today, continue with Santyl to the lower extremity and she will follow-up next week. 09/24/17 on evaluation today unfortunately the patient's wound appears to be doing significantly worse compared to last week's evaluation and even my valuation the week before. She actually has bone noted on the left wound margin in the region of undermining unfortunately. This was not noted during the last evaluation. I am concerned about the fact that this seems to be worse not better since our last visit with her. My biggest concern is that she's likely developing a worsening osteomyelitis. This was discussed with patient and her son today during the office visit. 05/03/17 on evaluation today patient actually appears to be doing rather well in regard to her sacral ulcer compared to last week's evaluation. She has been tolerating the dressing changes without complication.  Fortunately even though we're not utilizing the Wound VAC she seems to be making some strides in seeing the wound area overall improved quite dramatically in my pinion in just one weeks time. She also seems to be staying off of this to the point that I do not see any evidence of new injury which is excellent news. Whatever she's been doing in that regard over the past week I would want her to continue. IMAN, REINERTSEN ([id number]) did also review the results of her bone culture which revealed group B strep as the responsible organism. Again this is what's causing her osteomyelitis she does have infectious disease appointment scheduled for 10/11/17 10/08/1898 valuation today patient appears to be doing better for the most part in regard to the sacral wound. Overall she is showing signs of having granulation of the bone which is good news. There is an area towards the left of the wound where she does seem to be showing some signs of opening this it would be due to additional pressure to the area unfortunately. There does not appear to be any evidence of infection spreading which is good news that do not see any evidence of significant purulent discharge which is also good news. 10/26/17 on evaluation today patient sacral ulcer actually appears to be very healthy and doing well in regard to the overall appearance of the wound. With that being said she does seem to be having some issues at this point in time with some shear/friction injury of the sacrum from where she was transported to [facility] for her infectious disease appointment. She has been placed on IV antibiotic therapy I will have to go back and find that note for review as I did not have access to that today. Nonetheless I will add the next visit be sure to document the exact antibiotics that she is taking at this point. Nonetheless I do believe the wound is doing better there's no bone exposure nothing that requires debridement in regard  to the sacral wound. Likewise her left lateral lower extremity ulcer also appears to be doing significantly better. In general I feel like things are showing signs of improvement all around. 11/12/17 on evaluation today patient appears to be doing rather well in regard to her sacral wound. In general I do see signs of  granulation at this point. Fortunately there does not appear to be any evidence of infection which is good news as well. In general overall happy with the appearance. With that being said I'll be see I would like for this to be progressing more rapidly as with the patient and her son but nonetheless at least things do not appear to be doing worse. Her lower extremity ulcer is doing significantly better. 11/26/17 on evaluation today patient appears to be doing a little better in regard to the sacral wound especially in the peripheral of the wound bed. She actually did have an abrasion on the right gluteal region that's completely resolved to this point. Her lower extremity also appears to be for the most part completely resolved. Overall the sacrum itself seems to be the one thing that is still open and given her trouble. No fevers, chills, nausea, or vomiting noted at this time. She actually has an appointment next week with infectious disease for recheck to see how things are doing. She maybe switch to oral medications at that point. 12/09/17 on evaluation today patient actually appears to be doing much better in regard to her sacral wound. I do feel like she has made good progress at this time as far as healing is concerned. In fact the wound looks better to me today than it has in quite some time. She is on oral antibiotic therapy and no longer on the IV antibiotics. She did see infectious disease last week. 12/24/17 on evaluation today patient's wounds actually appear to be doing decently well in regard to the sacral wound in particular. When it comes to her lower extremity ulcers  both appear to be completely healed at this point in the one area where she had lived the rash has completely resolved at this time. Overall very pleased with the progress that has been made. Nonetheless the patient seems to be doing well in general. She still does have quite a bit of healing to do in regard to the sacral wound but I feel like she is making progress. 01/07/18 on evaluation today patient actually appears to be doing excellent today regard to her sacral wound. The granular quality in the base of the wound is excellent and shown signs of good improvement there's no evidence of contusion nor deep tissue injury and the periwound actually appears to be doing well. In general I'm very pleased with the overall appearance. 02/04/18 on evaluation today patient's wound actually appears to be doing decently well as far as the granulation is concerned. She has been tolerating the dressing changes without complication. Right now we are still using the Wound VAC. Nonetheless overall there apparently is some cater to the wound and this is concerning simply for the fact that she could be developing a soft tissue infection again. She's had this happen in the past and at that time responded very well to the antibiotics. Nonetheless I don't think I would likely put her on anything prophylactically but rather wait for the results of the culture to return. 02/18/18 on evaluation today patient actually appears to be doing fairly well. She has been tolerating the dressing changes. And the Wound VAC seems to be doing well. Overall I'm very pleased with how things have improved over the past couple weeks since I last saw her. Fortunately there does not appear to be any evidence of overt infection at this time which is good news. She has been taking the antibiotics without complication. Specifically that is the  Bactrim DS which was prescribed on 02/08/18. 03/18/18 on evaluation today patient's wound actually  does have quite a bit of odor noted compared to previous. With that JOSCELINE, CHENARD. ([id number]) being said the wound itself overall does not appear to be too significant or worse. There is no event noted in the wound bed which is good news. With that being said the patient has been worried as the home health nurse stated that she thought she saw bone in the base of the wound. Nonetheless there's a little bit of bruising along the left lateral border of the wound bed which again does not appear to be terrible we have noticed this before but other than this the majority the wound bed appears to be doing excellent in my pinion. I am wondering if there may be some bacterial colonization. This could be leading to some of the odor that we are noting with the Wound VAC. 04/01/18 on evaluation today patient appears to actually be doing well in regard to the overall appearance of the wound itself. She also does not have any odor at this point in regard to the wound surface and I do believe the Bacons is extremely helpful in this regard. With that being said she does have a little bit of deep tissue injury on the medial aspect of the wound. Again last time it was more lateral. The lateral seems to have improved quite well. Nonetheless she also notes by way of her son that this has been very macerated in the past several weeks since I last saw her. Though things overall seem to be doing better I still think that the Wound VAC may help with managing her fluid better I'm wondering if we can attempt a gauze Wound VAC my hope is that this will be able to manage the moisture control as well as continuing with managing the odor of the wound and bacterial load since the Dakin s has done excellent in helping in that regard. 04/15/17 on evaluation today patient actually appears to be doing much better in regard to the overall appearance of her sacral wound. I do believe that the gauze Vac has been of benefit for her.  Overall she is tolerating this without complication which is also good news. There is no sign of injury to the sacral region at this point. 04/29/18 on evaluation today patient appears to be doing well in regard to her sacral ulcer. She sure signs of good granulation and the Gauze VAC is doing very well for her. Overall I'm pleased in this regard. The biggest issue is that the time from Friday till Monday seems to be quite long for her as far as the amount of time to keep the Wound VAC sealed. For that reason her son wonders if he can take this off on Sunday morning in order to give her day of just wet to dry dressing's with the Dakin's solution I think this would be okay. 05/13/18 on evaluation today patient appears to be doing okay in regard to her sacral ulcer. There does not appear to be any evidence of overt infection at this time which is good news. In general the drainage and moisture appears to be significantly improved this was after the pressure on the Wound VAC was increased after they contacted our office. With that being said a lot of the dark tissue around the edge of the wound has improved in the maceration is significantly better this seems to be managing much better.  Fortunately there is no signs of infection at this time. Overall the patient is doing quite well. The wound measurements are not significantly smaller today although they were over the past two visits we will see how things go. 05/27/18 on evaluation today patient's sacral wound actually appears to be doing fairly well at this point. She does have a deep tissue injury yet again on the medial aspect of the wound which is something that we seem to be seeing often. There is some question as to whether or not this is actually being caused by the Wound VAC bunching up and then her somewhat laying on it even though they are attempting to offload this as best as possible. Patient son states that overall they do the best they  can trying offload her and I definitely believe him. I may want to see about putting the Wound VAC on hold for a couple weeks and see if this happens just with standard dressing changes. 06/10/18 on evaluation today patient actually appears to be doing better in regard to her sacral wound. Overall there's no signs of infection, no odor, and no significant skin breakdown around the edge of the wound I feel like this is done much better 50 Dakin s soaked gauze dressing as opposed to legalizing anything such as the Wound VAC. I believe that we discontinue the Wound VAC as of today. 06/24/18 on evaluation today patient actually appears to be doing well in regard to her sacral ulcer. There's no signs of infection, no odor, and in my pinion no concerns at all. I feel like she is making good progress as far as I'm concerned. 07/12/18 on evaluation today patient appears to be doing very well in regard to her sacral ulcer. She has been tolerating the dressing changes without complication. Fortunately there does not appear to be any evidence of active infection at this time she is doing excellent as far as I'm concerned at this point. 08/11/18 on evaluation today patient's sacral wound actually appears to be doing quite well at this point. The main issue I see is that she continues to have some maceration and skin breakdown due to moisture over the right periwound location unfortunately. We've investigated the possibility of using zinc oxide before but have not utilized it simply due to the fact that she continues to state. Her son anyways that she is allergic to this. They have not tried it recently and may be completely unrelated to the reaction she had before. Nonetheless this may be something you want to attempt will discuss that in the plan. 10/04/18 on evaluation today patient actually appears to be doing quite well in regard to her sacral wound. It's been roughly 2 months since I last seen her in that time  her wound has definitely made progress. This is very slow but again nonetheless SHAHLA, DIAZDELEON. ([id number]) does seem to be trending in the correct direction. Overall very pleased in this regard. No fevers, chills, nausea, or vomiting noted at this time. 11/07/2018 on evaluation today patient appears to be doing decently well with regard to her sacral wound based on what I am seeing at this point. She is seen by way of a telehealth visit due to the COVID-19 national emergency and to be honest the patient is somewhat reluctant to come out to be seen for visits which I completely understand. They feel much safer keeping her home as much as possible due to her adequate condition in general. The only issue  that she has had recently she did have some wheezing and subsequently chest congestion she was just placed on albuterol along with prednisone at this point. She seems to be doing much better even after 24 hours of the prednisone according to her [name]. Overall they are pleased with how things seem to be progressing. 11/28/2018 upon evaluation today patient is seen by way of telehealth visit since she is still somewhat reluctant to come out due to the COVID-19 pandemic. Subsequently she seems to be doing well and infected I could not obtain actual measurements for the wound the overall appearance of the wound is dramatically improved. She does not appear to have nearly as much space at the 12:00 location and in fact her [name] who is taking care of her mainly states that he is barely able to get anything up into that region is filled and so nicely. Overall I feel like she is making great progress. 12/12/2018 patient is seen today via a telehealth visit again secondary to her concerns about coming out during the COVID-19 national pandemic. Nonetheless she appears to be doing quite well at this time which is good news. There is no signs of active infection at this time. Overall patient seems to  be very pleased as is her son who takes very good care of her. 01/16/2019 on evaluation today patient actually appears to be doing very well with regard to her wound. We have been doing zoom calls quite a bit since the COVID-19 pandemic began just due to the fact that she has been somewhat concerned about coming out. With that being said she seems to have done very well and in fact the wound is measuring quite a bit smaller today compared to the last measurement. Fortunately there is no signs of active infection at this time. She is also having great improvement overall in her demeanor she is very upbeat and pleased with how things have progressed. 02/21/2019 upon evaluation today patient actually appears to be doing very well with regard to her wound. She has been tolerating the dressing changes her sons been performing these without complication and seems to do an excellent job in my opinion. I am actually very pleased with the overall appearance. The wound is significantly smaller compared to what its been in the past by quite a bit. 03/21/19 on evaluation today patient appears to be doing excellent in regard to her wound. She's been tolerating the dressing changes without complication and there does not appear to be evidence of active infection which is good news. She is still tolerating the Dakin s Moistened gauze packing without complication. 04/18/19 on evaluation today patient is seen by way of a telehealth visit due to her concerns of being exposed to Covid-19 Virus if she comes into the clinic. Subsequently her son is present to help with the visit today. They have been performing the dressing changes specifically Dakins soloution at this point in order to help with granulation in the base of the wound. Fortunately there is no evidence of active infection currently. Her son does note that he feels like maybe some bone in the base of the wound centrally that he is somewhat concerned about. With  that being said it's not entirely visible but he states that he can palpate it. 05/25/2019 upon evaluation today patient appears to be doing very well in fact in regard to her wounds. I am very pleased with how things seem to be progressing and though she is not  showing any signs of infection I do believe that it may be time to switch the dressing to something a little different I think she has a lot of irritation in the periwound and this is likely coming some from the fact that she does have a lot of moisture. I think the wound is small enough we can see about initiation of a silver alginate dressing. I think this will help keep the periwound from breaking down. Overall however I am very pleased with how things stand. Objective KAYLEEANN, HUXFORD ([id number]) Constitutional Obese and well-hydrated in no acute distress. Vitals Time Taken: 11:16 AM, Height: 70 in, Temperature: 98.6 F, Pulse: 76 bpm, Respiratory Rate: 16 breaths/min, Blood Pressure: 160/80 mmHg. Respiratory normal breathing without difficulty. Psychiatric this patient is able to make decisions and demonstrates good insight into disease process. Alert and Oriented x 3. pleasant and cooperative. General Notes: Patient's wound bed currently showed signs of good granulation there was no direct bone exposure though she does have some bone close to the surface and central portion of the wound that is been the case all along this is really not a significant change but again we really want to see more granulation to be honest. I do believe that if we switch away from the Dakin's just have them cleaned with this and then use a silver alginate packed into the wound bed she may see an derive additional benefit. Integumentary (Hair, Skin) Wound #1 status is Open. Original cause of wound was Pressure Injury. The wound is located on the Medial Sacrum. The wound measures 5cm length x 3cm width x 2.5cm depth; 11.781cm^2 area and 29.452cm^3  volume. There is muscle, Fat Layer (Subcutaneous Tissue) Exposed, and fascia exposed. There is no tunneling or undermining noted. There is a medium amount of sanguinous drainage noted. Foul odor after cleansing was noted. The wound margin is distinct with the outline attached to the wound base. There is large (67-100%) red, pink, hyper - granulation within the wound bed. There is a small (1-33%) amount of necrotic tissue within the wound bed including Adherent Slough. 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 JAYLAN, DUGGAR ([id number]) Skin Barriers/Peri-Wound Care: Wound #1 Medial Sacrum: Skin Prep Primary Wound Dressing: Wound #1 Medial Sacrum: Alginate - with dry gauze behind alginate to fill space Secondary Dressing: Wound #1 Medial Sacrum: ABD pad - secure with tape Dressing Change Frequency: Wound #1 Medial Sacrum: Change dressing twice daily. Follow-up Appointments: Wound #1 Medial Sacrum: Return Appointment in 2 weeks. Off-Loading: Wound #1 Medial Sacrum: Turn and reposition every 2 hours Additional Orders / Instructions: Wound #1 Medial Sacrum: Increase protein intake. Home Health: Wound #1 Medial Sacrum: Continue Home Health Visits - [facility] Health Nurse may visit PRN to address patient s wound care needs. FACE TO FACE ENCOUNTER: MEDICARE and MEDICAID PATIENTS: I certify that this patient is under my care and that I had a face-to-face encounter that meets the physician face-to-face  encounter requirements with this patient on this date. The encounter with the patient was in whole or in part for the following  MEDICAL CONDITION: (primary reason for Home Healthcare) MEDICAL NECESSITY: I certify, that based on my findings, NURSING services are a medically necessary home health service. HOME BOUND STATUS: I certify that my clinical findings support that this patient is homebound (i.e., Due to illness or injury, pt requires aid of supportive devices such as crutches, cane, wheelchairs, walkers, the use of special transportation or the assistance of another person to leave their place of residence. There is a normal inability to leave the home and doing so requires considerable and taxing effort. Other absences are for medical reasons / religious services and are infrequent or of short duration when for other reasons). If current dressing causes regression in wound condition, may D/C ordered dressing product/s and apply Normal Saline Moist Dressing daily until next Wound Healing Center / Other MD appointment. Notify Wound Healing Center of regression in wound condition at [phone]. Please direct any NON-WOUND related issues/requests for orders to patient's Primary Care Physician 1. My suggestion at this time is good to be that we go ahead and initiate a switch away from the Dakin's moistened gauze we will have him continue to clean the area with Dakin's solution but then subsequently apply the silver alginate dressing packed into the wound bed if there is extra space that needs to be filled a little bit of dry gauze can be used over top of this than the ABD pads. 2. I recommend continued and appropriate offloading they have done a great job with this I see no issues in that regard I would recommend that they continue as such with what they have been doing. 3. I am also going to suggest that they continue to monitor for any signs of infection however I am not overly worried  about this. The patient really does not like to come out to be seen secondary to all the Covid which is very prevalent in this area at this point for that reason we are going to do a 2-week check. We will see patient back for reevaluation in 2 weeks here in the clinic. If anything worsens or changes patient will contact our office for additional recommendations. Electronic Signature(s) Signed: 05/25/2019 6:02:00 PM By: Belia Heman ([id number]) Entered By: Lenda Kelp on 05/25/2019 18:02:00 Jessica [name] ([id number]) -------------------------------------------------------------------------------- SuperBill Details [name]. Date of Service: 05/25/2019 Medical Record Number: [id number] Patient Account Number: [id number] Date of Birth/Sex: 07-19-1945 (74 y.o. F) Treating RN: Rodell Perna Primary Care Provider: Annita Brod Other Clinician: Referring Provider: Annita Brod Treating Provider/Extender: STONE III, Laurali Goddard Weeks in Treatment: 104 Diagnosis Coding ICD-10 Codes Code Description L89.154 Pressure ulcer of sacral region, stage 4 M48.00 Spinal stenosis, site unspecified R54 Age-related physical debility G89.4 Chronic pain syndrome E78.2 Mixed hyperlipidemia L89.93 Pressure ulcer of unspecified site, stage 3 Facility Procedures CPT4 Code: [id number] Description: 99213 - WOUND CARE VISIT-LEV 3 EST PT Modifier: Quantity: 1 Physician Procedures CPT4 Code: 2440102 Description: 99213 - WC PHYS LEVEL 3 - EST PT ICD-10 Diagnosis Description L89.154 Pressure ulcer of sacral region, stage 4 M48.00 Spinal stenosis, site unspecified R54 Age-related physical debility G89.4 Chronic pain syndrome Modifier: Quantity: 1 Electronic Signature(s) Signed: 05/25/2019 6:04:52 PM By: Lenda Kelp PA-C Entered By: Lenda Kelp on 05/25/2019 18:04:50

## 2019-05-25 NOTE — Progress Notes (Addendum)
Jessica, WOODLE ([id number]) Visit Report for 05/25/2019 Arrival Information Details [name], Jessica [name]. Date of Service: 05/25/2019 10:45 AM Medical Record Number: [id number] Patient Account Number: [id number] Date of Birth/Sex: Feb 17, 1946 (74 y.o. F) Treating RN: Cornell Barman Primary Care Beckie Viscardi: Clovia Cuff Other Clinician: Referring Quanasia Defino: Clovia Cuff Treating Ares Tegtmeyer/Extender: Melburn Hake, HOYT Weeks in Treatment: 104 Visit Information History Since Last Visit Added or deleted any medications: No Patient Arrived: Stretcher Any new allergies or adverse reactions: No Arrival Time: 11:15 Had a fall or experienced change in No Accompanied By: son activities of daily living that may affect Transfer Assistance: Stretcher risk of falls: Patient Identification Verified: Yes Signs or symptoms of abuse/neglect since last visito No Secondary Verification Process Yes Hospitalized since last visit: No Completed: Implantable device outside of the clinic excluding No Patient Has Alerts: Yes cellular tissue based products placed in the center Patient Alerts: ALLERGIC TO since last visit: ZINC Pain Present Now: No Electronic Signature(s) Signed: 05/25/2019 5:05:23 PM By: Gretta Cool, BSN, RN, CWS, Kim RN, BSN Entered By: Gretta Cool, BSN, RN, CWS, Kim on 05/25/2019 11:15:51 Jessica [name] ([id number]) -------------------------------------------------------------------------------- Clinic Level of Care Assessment Details [name]. Date of Service: 05/25/2019 10:45 AM Medical Record Number: [id number] Patient Account Number: [id number] Date of Birth/Sex: 1945-06-28 (74 y.o. F) Treating RN: Army Melia Primary Care Lois Ostrom: Clovia Cuff Other Clinician: Referring Cherie Lasalle: Clovia Cuff Treating Bresha Hosack/Extender: Melburn Hake, HOYT Weeks in Treatment: 104 Clinic Level of Care Assessment Items TOOL 4 Quantity Score []  - Use when only an EandM is  performed on FOLLOW-UP visit 0 ASSESSMENTS - Nursing Assessment / Reassessment X - Reassessment of Co-morbidities (includes updates in patient status) 1 10 X- 1 5 Reassessment of Adherence to Treatment Plan ASSESSMENTS - Wound and Skin Assessment / Reassessment X - Simple Wound Assessment / Reassessment - one wound 1 5 []  - 0 Complex Wound Assessment / Reassessment - multiple wounds []  - 0 Dermatologic / Skin Assessment (not related to wound area) ASSESSMENTS - Focused Assessment []  - Circumferential Edema Measurements - multi extremities 0 []  - 0 Nutritional Assessment / Counseling / Intervention []  - 0 Lower Extremity Assessment (monofilament, tuning fork, pulses) []  - 0 Peripheral Arterial Disease Assessment (using hand held doppler) ASSESSMENTS - Ostomy and/or Continence Assessment and Care []  - Incontinence Assessment and Management 0 []  - 0 Ostomy Care Assessment and Management (repouching, etc.) PROCESS - Coordination of Care X - Simple Patient / Family Education for ongoing care 1 15 []  - 0 Complex (extensive) Patient / Family Education for ongoing care X- 1 10 Staff obtains Programmer, systems, Records, Test Results / Process Orders []  - 0 Staff telephones HHA, Nursing Homes / Clarify orders / etc []  - 0 Routine Transfer to another Facility (non-emergent condition) []  - 0 Routine Hospital Admission (non-emergent condition) []  - 0 New Admissions / Biomedical engineer / Ordering NPWT, Apligraf, etc. []  - 0 Emergency Hospital Admission (emergent condition) X- 1 10 Simple Discharge Coordination ZINNIA, TINDALL. ([id number]) []  - 0 Complex (extensive) Discharge Coordination PROCESS - Special Needs []  - Pediatric / Minor Patient Management 0 []  - 0 Isolation Patient Management []  - 0 Hearing / Language / Visual special needs []  - 0 Assessment of Community assistance (transportation, D/C planning, etc.) []  - 0 Additional assistance / Altered mentation []  -  0 Support Surface(s) Assessment (bed, cushion, seat, etc.) INTERVENTIONS - Wound Cleansing / Measurement X - Simple Wound Cleansing - one wound 1 5 []  - 0 Complex  Wound Cleansing - multiple wounds X- 1 5 Wound Imaging (photographs - any number of wounds) []  - 0 Wound Tracing (instead of photographs) X- 1 5 Simple Wound Measurement - one wound []  - 0 Complex Wound Measurement - multiple wounds INTERVENTIONS - Wound Dressings []  - Small Wound Dressing one or multiple wounds 0 X- 1 15 Medium Wound Dressing one or multiple wounds []  - 0 Large Wound Dressing one or multiple wounds []  - 0 Application of Medications - topical []  - 0 Application of Medications - injection INTERVENTIONS - Miscellaneous []  - External ear exam 0 []  - 0 Specimen Collection (cultures, biopsies, blood, body fluids, etc.) []  - 0 Specimen(s) / Culture(s) sent or taken to Lab for analysis []  - 0 Patient Transfer (multiple staff / / Similar devices) []  - 0 Simple Staple / Suture removal (25 or less) []  - 0 Complex Staple / Suture removal (26 or more) []  - 0 Hypo / Hyperglycemic Management (close monitor of Blood Glucose) []  - 0 Ankle / Brachial Index (ABI) - do not check if billed separately X- 1 5 Vital Signs Wallenstein, Bentley W. ( ) Has the patient been seen at the hospital within the last three years: Yes Total Score: 90 Level Of Care: New/Established - Level 3 Electronic Signature(s) Signed: 05/25/2019 11:47:46 AM By: Entered By: on 05/25/2019 11:44:52 ( ) -------------------------------------------------------------------------------- Encounter Discharge Information Details Patient Name: . Date of Service: 05/25/2019 10:45 AM Medical Record Number: Nurse, adult Patient Account Number: Date of Birth/Sex: 1945/09/26 (74 y.o. F) Treating RN: Primary Care Arieal Cuoco: [phone] Other  Clinician: Referring Raydel Hosick: 07/23/2019 Treating Regie Bunner/Extender: Rodell Perna, HOYT Weeks in Treatment: 104 Encounter Discharge Information Items Discharge Condition: Stable Ambulatory Status: Wheelchair Discharge Destination: Home Transportation: Private Auto Accompanied By: son Schedule Follow-up Appointment: Yes Clinical Summary of Care: Electronic Signature(s) Signed: 05/25/2019 11:47:46 AM By: 07/23/2019 Entered By: [name] on 05/25/2019 11:45:53 [name] (07/23/2019) -------------------------------------------------------------------------------- Lower Extremity Assessment Details Patient Name: [id number]. Date of Service: 05/25/2019 10:45 AM Medical Record Number: 06/10/1945 Patient Account Number: (75 Date of Birth/Sex: 1945/08/03 (74 y.o. F) Treating RN: Annita Brod Primary Care Kason Benak: Linwood Dibbles Other Clinician: Referring Heddy Vidana: 07/23/2019 Treating Kinaya Hilliker/Extender: Rodell Perna Weeks in Treatment: 104 Electronic Signature(s) Signed: 05/25/2019 5:05:23 PM By: 07/23/2019, BSN, RN, CWS, Kim RN, BSN Entered By: [name], BSN, RN, CWS, Kim on 05/25/2019 11:26:27 [name] (07/23/2019) -------------------------------------------------------------------------------- Multi Wound Chart Details Patient Name: [id number]. Date of Service: 05/25/2019 10:45 AM Medical Record Number: 06/10/1945 Patient Account Number: (75 Date of Birth/Sex: 07-08-1945 (74 y.o. F) Treating RN: Annita Brod Primary Care Valina Maes: Lenda Kelp Other Clinician: Referring Ethie Curless: 07/23/2019 Treating Aileana Hodder/Extender: STONE III, HOYT Weeks in Treatment: 104 Vital Signs Height(in): 70 Pulse(bpm): 76 Weight(lbs): Blood Pressure(mmHg): 160/80 Body Mass Index(BMI): Temperature(F): 98.6 Respiratory Rate 16 (breaths/min): Photos: [N/A:N/A] Wound Location: Sacrum - Medial N/A N/A Wounding Event: Pressure Injury N/A  N/A Primary Etiology: Pressure Ulcer N/A N/A Date Acquired: 01/15/2017 N/A N/A Weeks of Treatment: 104 N/A N/A Wound Status: Open N/A N/A Measurements L x W x D 5x3x2.5 N/A N/A (cm) Area (cm) : 11.781 N/A N/A Volume (cm) : 29.452 N/A N/A % Reduction in Area: 68.00% N/A N/A % Reduction in Volume: 20.10% N/A N/A Classification: Category/Stage IV N/A N/A Exudate Amount: Medium N/A N/A Exudate Type: Sanguinous N/A N/A Exudate Color: red N/A N/A Foul Odor After Cleansing: Yes N/A  N/A Odor Anticipated Due to No N/A N/A Product Use: Wound Margin: Distinct, outline attached N/A N/A Granulation Amount: Large (67-100%) N/A N/A Granulation Quality: Red, Pink, Hyper-granulation N/A N/A Necrotic Amount: Small (1-33%) N/A N/A Exposed Structures: Fascia: Yes N/A N/A Fat Layer (Subcutaneous Tissue) Exposed: Yes Muscle: Yes Tendon: No MISCHELL, BRANFORD ([id number]) Joint: No Bone: No Epithelialization: Small (1-33%) N/A N/A Treatment Notes Electronic Signature(s) Signed: 05/25/2019 11:47:46 AM By: Rodell Perna Entered By: Rodell Perna on 05/25/2019 11:34:30 [name] ([id number]) -------------------------------------------------------------------------------- Multi-Disciplinary Care Plan Details Patient Name: [name]. Date of Service: 05/25/2019 10:45 AM Medical Record Number: [id number] Patient Account Number: [id number] Date of Birth/Sex: 1945-09-28 (74 y.o. F) Treating RN: Rodell Perna Primary Care Raynette Arras: Annita Brod Other Clinician: Referring Allex Lapoint: Annita Brod Treating Kinslie Hove/Extender: STONE III, HOYT Weeks in Treatment: 104 Active Inactive Pressure Nursing Diagnoses: Knowledge deficit related to causes and risk factors for pressure ulcer development Knowledge deficit related to management of pressures ulcers Potential for impaired tissue integrity related to pressure, friction, moisture, and shear Goals: Patient will remain free from  development of additional pressure ulcers Date Initiated: 06/03/2017 Target Resolution Date: 06/17/2017 Goal Status: Active Patient will remain free of pressure ulcers Date Initiated: 06/03/2017 Target Resolution Date: 06/17/2017 Goal Status: Active Patient/caregiver will verbalize risk factors for pressure ulcer development Date Initiated: 06/03/2017 Target Resolution Date: 06/17/2017 Goal Status: Active Interventions: Assess: immobility, friction, shearing, incontinence upon admission and as needed Assess potential for pressure ulcer upon admission and as needed Notes: Wound/Skin Impairment Nursing Diagnoses: Impaired tissue integrity Goals: Patient/caregiver will verbalize understanding of skin care regimen Date Initiated: 06/03/2017 Target Resolution Date: 06/17/2017 Goal Status: Active Ulcer/skin breakdown will have a volume reduction of 30% by week 4 Date Initiated: 06/03/2017 Target Resolution Date: 06/17/2017 Goal Status: Active Interventions: Assess ulceration(s) every visit Treatment Activities: CRYSTALINA, STODGHILL ([id number]) Patient referred to home care : 06/03/2017 Skin care regimen initiated : 06/03/2017 Notes: Electronic Signature(s) Signed: 05/25/2019 11:47:46 AM By: Rodell Perna Entered By: Rodell Perna on 05/25/2019 11:34:15 [name] ([id number]) -------------------------------------------------------------------------------- Pain Assessment Details Patient Name: [name]. Date of Service: 05/25/2019 10:45 AM Medical Record Number: [id number] Patient Account Number: [id number] Date of Birth/Sex: 02-22-1946 (74 y.o. F) Treating RN: Huel Coventry Primary Care Arliene Rosenow: Annita Brod Other Clinician: Referring Ashland Osmer: Annita Brod Treating Bronnie Vasseur/Extender: Linwood Dibbles, HOYT Weeks in Treatment: 104 Active Problems Location of Pain Severity and Description of Pain Patient Has Paino No Site Locations Pain Management and Medication Current Pain  Management: Notes Patient denies pain at this time. Electronic Signature(s) Signed: 05/25/2019 5:05:23 PM By: Elliot Gurney, BSN, RN, CWS, Kim RN, BSN Entered By: Elliot Gurney, BSN, RN, CWS, Kim on 05/25/2019 11:16:14 [name] ([id number]) -------------------------------------------------------------------------------- Patient/Caregiver Education Details Patient Name: [name]. Date of Service: 05/25/2019 10:45 AM Medical Record Number: [id number] Patient Account Number: [id number] Date of Birth/Gender: 22-Jan-1946 (74 y.o. F) Treating RN: Rodell Perna Primary Care Physician: Annita Brod Other Clinician: Referring Physician: Annita Brod Treating Physician/Extender: Skeet Simmer in Treatment: 104 Education Assessment Education Provided To: Patient Education Topics Provided Wound/Skin Impairment: Handouts: Caring for Your Ulcer Methods: Demonstration, Explain/Verbal Responses: State content correctly Electronic Signature(s) Signed: 05/25/2019 11:47:46 AM By: Rodell Perna Entered By: Rodell Perna on 05/25/2019 11:45:06 [name] ([id number]) -------------------------------------------------------------------------------- Wound Assessment Details Patient Name: [name]. Date of Service: 05/25/2019 10:45 AM Medical Record Number: [id number] Patient Account Number: [id number] Date of Birth/Sex: 1945/07/20 (74 y.o. F) Treating RN: Huel Coventry Primary Care Tramond Slinker: Darleene Cleaver,  PHILIP Other Clinician: Referring Ashna Dorough: Annita Brod Treating Aliene Tamura/Extender: STONE III, HOYT Weeks in Treatment: 104 Wound Status Wound Number: 1 Primary Etiology: Pressure Ulcer Wound Location: Sacrum - Medial Wound Status: Open Wounding Event: Pressure Injury Date Acquired: 01/15/2017 Weeks Of Treatment: 104 Clustered Wound: No Photos Wound Measurements Length: (cm) 5 Width: (cm) 3 Depth: (cm) 2.5 Area: (cm) 11.781 Volume: (cm) 29.452 % Reduction in Area:  68% % Reduction in Volume: 20.1% Epithelialization: Small (1-33%) Tunneling: No Undermining: No Wound Description Classification: Category/Stage IV Foul Odor Wound Margin: Distinct, outline attached Due to Pro Exudate Amount: Medium Slough/Fib Exudate Type: Sanguinous Exudate Color: red After Cleansing: Yes duct Use: No rino Yes Wound Bed Granulation Amount: Large (67-100%) Exposed Structure Granulation Quality: Red, Pink, Hyper-granulation Fascia Exposed: Yes Necrotic Amount: Small (1-33%) Fat Layer (Subcutaneous Tissue) Exposed: Yes Necrotic Quality: Adherent Slough Tendon Exposed: No Muscle Exposed: Yes Necrosis of Muscle: No Joint Exposed: No Bone Exposed: No Treatment Notes ALEKSANDRA, RABEN ([id number]) Wound #1 (Medial Sacrum) Notes plain AG, dry gauze, ABD, tape Electronic Signature(s) Signed: 05/25/2019 5:05:23 PM By: Elliot Gurney, BSN, RN, CWS, Kim RN, BSN Entered By: Elliot Gurney, BSN, RN, CWS, Kim on 05/25/2019 11:24:03 [name] ([id number]) -------------------------------------------------------------------------------- Vitals Details Patient Name: [name]. Date of Service: 05/25/2019 10:45 AM Medical Record Number: [id number] Patient Account Number: [id number] Date of Birth/Sex: 17-Nov-1945 (74 y.o. F) Treating RN: Huel Coventry Primary Care Jujhar Everett: Annita Brod Other Clinician: Referring Idabelle Mcpeters: Annita Brod Treating Somtochukwu Woollard/Extender: STONE III, HOYT Weeks in Treatment: 104 Vital Signs Time Taken: 11:16 Temperature (F): 98.6 Height (in): 70 Pulse (bpm): 76 Unable to obtain height and weight: Medical Reason Respiratory Rate (breaths/min): 16 Blood Pressure (mmHg): 160/80 Reference Range: 80 - 120 mg / dl Electronic Signature(s) Signed: 05/25/2019 5:05:23 PM By: Elliot Gurney, BSN, RN, CWS, Kim RN, BSN Entered By: Elliot Gurney, BSN, RN, CWS, Kim on 05/25/2019 11:16:39

## 2019-06-08 ENCOUNTER — Ambulatory Visit: Payer: Medicare Other | Admitting: Physician Assistant

## 2019-06-15 ENCOUNTER — Other Ambulatory Visit: Payer: Self-pay

## 2019-06-15 ENCOUNTER — Encounter: Payer: Medicare Other | Attending: Physician Assistant | Admitting: Physician Assistant

## 2019-06-15 DIAGNOSIS — L97909 Non-pressure chronic ulcer of unspecified part of unspecified lower leg with unspecified severity: Secondary | ICD-10-CM | POA: Diagnosis not present

## 2019-06-15 DIAGNOSIS — E782 Mixed hyperlipidemia: Secondary | ICD-10-CM | POA: Diagnosis not present

## 2019-06-15 DIAGNOSIS — M4800 Spinal stenosis, site unspecified: Secondary | ICD-10-CM | POA: Diagnosis not present

## 2019-06-15 DIAGNOSIS — L89154 Pressure ulcer of sacral region, stage 4: Secondary | ICD-10-CM | POA: Insufficient documentation

## 2019-06-15 DIAGNOSIS — Z7401 Bed confinement status: Secondary | ICD-10-CM | POA: Insufficient documentation

## 2019-06-15 DIAGNOSIS — E669 Obesity, unspecified: Secondary | ICD-10-CM | POA: Insufficient documentation

## 2019-06-15 DIAGNOSIS — G894 Chronic pain syndrome: Secondary | ICD-10-CM | POA: Insufficient documentation

## 2019-06-15 NOTE — Progress Notes (Signed)
Jessica [name], Jessica [name] ([id number]) Visit Report for 06/15/2019 Arrival Information Details [name], Jessica [name]. Date of Service: 06/15/2019 11:00 AM Medical Record Number: [id number] Patient Account Number: [id number] Date of Birth/Sex: 1945/09/11 (74 y.o. F) Treating RN: Montey Hora Primary Care Wesleigh Markovic: Clovia Cuff Other Clinician: Referring Lois Ostrom: Clovia Cuff Treating Ruhee Enck/Extender: Melburn Hake, HOYT Weeks in Treatment: 107 Visit Information History Since Last Visit Added or deleted any medications: No Patient Arrived: Stretcher Any new allergies or adverse reactions: No Arrival Time: 10:46 Had a fall or experienced change in No Accompanied By: son activities of daily living that may affect Transfer Assistance: Manual risk of falls: Patient Identification Verified: Yes Signs or symptoms of abuse/neglect since last visito No Secondary Verification Process Completed: Yes Hospitalized since last visit: No Patient Has Alerts: Yes Implantable device outside of the clinic excluding No Patient Alerts: ALLERGIC TO ZINC cellular tissue based products placed in the center since last visit: Has Dressing in Place as Prescribed: Yes Pain Present Now: No Electronic Signature(s) Signed: 06/15/2019 4:34:11 PM By: Montey Hora Entered By: Montey Hora on 06/15/2019 10:46:59 Jessica [name] ([id number]) -------------------------------------------------------------------------------- Encounter Discharge Information Details [name]. Date of Service: 06/15/2019 11:00 AM Medical Record Number: [id number] Patient Account Number: [id number] Date of Birth/Sex: Apr 13, 1946 (74 y.o. F) Treating RN: Army Melia Primary Care Devesh Monforte: Clovia Cuff Other Clinician: Referring Anajulia Leyendecker: Clovia Cuff Treating Kaspian Muccio/Extender: Melburn Hake, HOYT Weeks in Treatment: 107 Encounter Discharge Information Items Post Procedure Vitals Discharge Condition:  Stable Temperature (F): 98.3 Ambulatory Status: Stretcher Pulse (bpm): 89 Discharge Destination: Home Respiratory Rate (breaths/min): 16 Transportation: Ambulance Blood Pressure (mmHg): 156/66 Accompanied By: son Schedule Follow-up Appointment: Yes Clinical Summary of Care: Electronic Signature(s) Signed: 06/15/2019 3:26:37 PM By: Army Melia Entered By: Army Melia on 06/15/2019 11:29:31 Jessica [name] ([id number]) -------------------------------------------------------------------------------- Lower Extremity Assessment Details [name]. Date of Service: 06/15/2019 11:00 AM Medical Record Number: [id number] Patient Account Number: [id number] Date of Birth/Sex: May 10, 1945 (74 y.o. F) Treating RN: Montey Hora Primary Care Janayia Burggraf: Clovia Cuff Other Clinician: Referring Juliett Eastburn: Clovia Cuff Treating Marlea Gambill/Extender: Melburn Hake, HOYT Weeks in Treatment: 107 Electronic Signature(s) Signed: 06/15/2019 4:34:11 PM By: Montey Hora Entered By: Montey Hora on 06/15/2019 10:48:10 Jessica [name] ([id number]) -------------------------------------------------------------------------------- Multi Wound Chart Details [name]. Date of Service: 06/15/2019 11:00 AM Medical Record Number: [id number] Patient Account Number: [id number] Date of Birth/Sex: 03-12-1946 (74 y.o. F) Treating RN: Army Melia Primary Care Wandell Scullion: Clovia Cuff Other Clinician: Referring Percival Glasheen: Clovia Cuff Treating Kelcie Currie/Extender: STONE III, HOYT Weeks in Treatment: 107 Vital Signs Height(in): 70 Pulse(bpm): 54 Weight(lbs): Blood Pressure(mmHg): 156/66 Body Mass Index(BMI): Temperature(F): 98.3 Respiratory Rate(breaths/min): 16 Photos: [1:No Photos] [6:No Photos] [N/A:N/A] Wound Location: [1:Sacrum - Medial] [6:Right Sacrum] [N/A:N/A] Wounding Event: [1:Pressure Injury] [6:Pressure Injury] [N/A:N/A] Primary Etiology: [1:Pressure  Ulcer] [6:Pressure Ulcer] [N/A:N/A] Date Acquired: [1:01/15/2017] [6:05/28/2019] [N/A:N/A] Weeks of Treatment: [1:107] [6:0] [N/A:N/A] Wound Status: [1:Open] [6:Open] [N/A:N/A] Measurements L x W x D (cm) [1:5.5x4x2] [6:1.1x0.8x0.3] [N/A:N/A] Area (cm) : [1:17.279] [6:0.691] [N/A:N/A] Volume (cm) : [1:34.558] [6:0.207] [N/A:N/A] % Reduction in Area: [1:53.10%] [6:N/A] [N/A:N/A] % Reduction in Volume: [1:6.20%] [6:N/A] [N/A:N/A] Starting Position 1 (o'clock): [1:7] Ending Position 1 (o'clock): [1:12] Maximum Distance 1 (cm): [1:2.3] Undermining: [1:Yes] [6:No] [N/A:N/A] Classification: [1:Category/Stage IV] [6:Category/Stage III] [N/A:N/A] Exudate Amount: [1:Medium] [6:Medium] [N/A:N/A] Exudate Type: [1:Sanguinous] [6:Serous] [N/A:N/A] Exudate Color: [1:red] [6:amber] [N/A:N/A] Foul Odor After Cleansing: [1:Yes] [6:No] [N/A:N/A] Odor Anticipated Due to Product [1:No] [6:N/A] [N/A:N/A] Use: Wound Margin: [1:Distinct, outline  attached] [6:Flat and Intact] [N/A:N/A] Granulation Amount: [1:Large (67-100%)] [6:Large (67-100%)] [N/A:N/A] Granulation Quality: [1:Red, Pink, Hyper-granulation] [6:Pink] [N/A:N/A] Necrotic Amount: [1:Small (1-33%)] [6:Small (1-33%)] [N/A:N/A] Exposed Structures: [1:Fascia: Yes Fat Layer (Subcutaneous Tissue) Exposed: Yes Muscle: Yes Tendon: No Joint: No Bone: No Small (1-33%)] [6:Fat Layer (Subcutaneous Tissue) Exposed: Yes Fascia: No Tendon: No Muscle: No Joint: No Bone: No None] [N/A:N/A N/A] Treatment Notes Electronic Signature(s) Signed: 06/15/2019 3:26:37 PM By: Rodell Perna Entered By: Rodell Perna on 06/15/2019 11:22:14 Jessica [name] ([id number]) -------------------------------------------------------------------------------- Multi-Disciplinary Care Plan Details [name]. Date of Service: 06/15/2019 11:00 AM Medical Record Number: [id number] Patient Account Number: [id number] Date of Birth/Sex: 1945-12-09 (74 y.o. F) Treating  RN: Rodell Perna Primary Care Dystany Duffy: Annita Brod Other Clinician: Referring Cordarrell Sane: Annita Brod Treating Bryauna Byrum/Extender: STONE III, HOYT Weeks in Treatment: 107 Active Inactive Pressure Nursing Diagnoses: Knowledge deficit related to causes and risk factors for pressure ulcer development Knowledge deficit related to management of pressures ulcers Potential for impaired tissue integrity related to pressure, friction, moisture, and shear Goals: Patient will remain free from development of additional pressure ulcers Date Initiated: 06/03/2017 Target Resolution Date: 06/17/2017 Goal Status: Active Patient will remain free of pressure ulcers Date Initiated: 06/03/2017 Target Resolution Date: 06/17/2017 Goal Status: Active Patient/caregiver will verbalize risk factors for pressure ulcer development Date Initiated: 06/03/2017 Target Resolution Date: 06/17/2017 Goal Status: Active Interventions: Assess: immobility, friction, shearing, incontinence upon admission and as needed Assess potential for pressure ulcer upon admission and as needed Notes: Wound/Skin Impairment Nursing Diagnoses: Impaired tissue integrity Goals: Patient/caregiver will verbalize understanding of skin care regimen Date Initiated: 06/03/2017 Target Resolution Date: 06/17/2017 Goal Status: Active Ulcer/skin breakdown will have a volume reduction of 30% by week 4 Date Initiated: 06/03/2017 Target Resolution Date: 06/17/2017 Goal Status: Active Interventions: Assess ulceration(s) every visit Treatment Activities: Patient referred to home care : 06/03/2017 Skin care regimen initiated : 06/03/2017 Notes: Electronic Signature(s) Signed: 06/15/2019 3:26:37 PM By: Eugenia Pancoast ([id number]) Entered By: Rodell Perna on 06/15/2019 11:19:38 Jessica [name] ([id number]) -------------------------------------------------------------------------------- Pain Assessment Details [name]. Date of Service: 06/15/2019 11:00 AM Medical Record Number: [id number] Patient Account Number: [id number] Date of Birth/Sex: Feb 28, 1946 (74 y.o. F) Treating RN: Curtis Sites Primary Care Earlisha Sharples: Annita Brod Other Clinician: Referring Thy Gullikson: Annita Brod Treating Jaidence Geisler/Extender: STONE III, HOYT Weeks in Treatment: 107 Active Problems Location of Pain Severity and Description of Pain Patient Has Paino No Site Locations Pain Management and Medication Current Pain Management: Electronic Signature(s) Signed: 06/15/2019 4:34:11 PM By: Curtis Sites Entered By: Curtis Sites on 06/15/2019 10:48:01 Jessica [name] ([id number]) -------------------------------------------------------------------------------- Patient/Caregiver Education Details [name]. Date of Service: 06/15/2019 11:00 AM Medical Record Number: [id number] Patient Account Number: [id number] Date of Birth/Gender: 1945/10/27 (74 y.o. F) Treating RN: Rodell Perna Primary Care Physician: Annita Brod Other Clinician: Referring Physician: Annita Brod Treating Physician/Extender: Linwood Dibbles, HOYT Weeks in Treatment: 107 Education Assessment Education Provided To: Patient Education Topics Provided Wound/Skin Impairment: Handouts: Caring for Your Ulcer Methods: Demonstration, Explain/Verbal Responses: State content correctly Electronic Signature(s) Signed: 06/15/2019 3:26:37 PM By: Rodell Perna Entered By: Rodell Perna on 06/15/2019 11:28:39 Jessica [name] ([id number]) -------------------------------------------------------------------------------- Wound Assessment Details [name]. Date of Service: 06/15/2019 11:00 AM Medical Record Number: [id number] Patient Account Number: [id number] Date of Birth/Sex: November 13, 1945 (74 y.o. F) Treating RN: Curtis Sites Primary Care Thinh Cuccaro: Annita Brod Other Clinician: Referring Graves Nipp: Annita Brod Treating Govanni Plemons/Extender: STONE III, HOYT Weeks in  Treatment: 107 Wound Status Wound Number: 1 Primary Etiology: Pressure Ulcer Wound Location: Sacrum - Medial Wound Status: Open Wounding Event: Pressure Injury Date Acquired: 01/15/2017 Weeks Of Treatment: 107 Clustered Wound: No Photos Photo Uploaded By: Curtis Sites on 06/15/2019 11:25:02 Wound Measurements Length: (cm) 5.5 % Red Width: (cm) 4 % Red Depth: (cm) 2 Epith Area: (cm) 17.279 Tunn Volume: (cm) 34.558 Unde St En Ma uction in Area: 53.1% uction in Volume: 6.2% elialization: Small (1-33%) eling: No rmining: Yes arting Position (o'clock): 7 ding Position (o'clock): 12 ximum Distance: (cm) 2.3 Wound Description Classification: Category/Stage IV Foul Wound Margin: Distinct, outline attached Due t Exudate Amount: Medium Sloug Exudate Type: Sanguinous Exudate Color: red Odor After Cleansing: Yes o Product Use: No h/Fibrino Yes Wound Bed Granulation Amount: Large (67-100%) Exposed Structure Granulation Quality: Red, Pink, Hyper-granulation Fascia Exposed: Yes Necrotic Amount: Small (1-33%) Fat Layer (Subcutaneous Tissue) Exposed: Yes Necrotic Quality: Adherent Slough Tendon Exposed: No Muscle Exposed: Yes Necrosis of Muscle: No Joint Exposed: No Bone Exposed: No Treatment Notes Wound #1 (Medial Sacrum) Jessica [name] ([id number]) Notes S. cell, ABD tape Electronic Signature(s) Signed: 06/15/2019 4:34:11 PM By: Curtis Sites Entered By: Curtis Sites on 06/15/2019 11:06:22 Jessica [name] ([id number]) -------------------------------------------------------------------------------- Wound Assessment Details [name]. Date of Service: 06/15/2019 11:00 AM Medical Record Number: [id number] Patient Account Number: [id number] Date of Birth/Sex: Oct 01, 1945 (74 y.o. F) Treating RN: Curtis Sites Primary Care Arnold Kester: Annita Brod Other Clinician: Referring  Jayley Hustead: Annita Brod Treating Rorik Vespa/Extender: STONE III, HOYT Weeks in Treatment: 107 Wound Status Wound Number: 6 Primary Etiology: Pressure Ulcer Wound Location: Right Sacrum Wound Status: Open Wounding Event: Pressure Injury Date Acquired: 05/28/2019 Weeks Of Treatment: 0 Clustered Wound: No Photos Photo Uploaded By: Curtis Sites on 06/15/2019 11:25:03 Wound Measurements Length: (cm) 1.1 % Red Width: (cm) 0.8 % Red Depth: (cm) 0.3 Epith Area: (cm) 0.691 Tunn Volume: (cm) 0.207 Unde uction in Area: uction in Volume: elialization: None eling: No rmining: No Wound Description Classification: Category/Stage III Foul Wound Margin: Flat and Intact Sloug Exudate Amount: Medium Exudate Type: Serous Exudate Color: amber Odor After Cleansing: No h/Fibrino Yes Wound Bed Granulation Amount: Large (67-100%) Exposed Structure Granulation Quality: Pink Fascia Exposed: No Necrotic Amount: Small (1-33%) Fat Layer (Subcutaneous Tissue) Exposed: Yes Necrotic Quality: Adherent Slough Tendon Exposed: No Muscle Exposed: No Joint Exposed: No Bone Exposed: No Treatment Notes Wound #6 (Right Sacrum) Notes S. cell, ABD tape Electronic Signature(s) Jessica [name], Jessica [name] ([id number]) Signed: 06/15/2019 4:34:11 PM By: Curtis Sites Entered By: Curtis Sites on 06/15/2019 11:13:25 Jessica [name] ([id number]) -------------------------------------------------------------------------------- Vitals Details [name]. Date of Service: 06/15/2019 11:00 AM Medical Record Number: [id number] Patient Account Number: [id number] Date of Birth/Sex: 27-Apr-1945 (74 y.o. F) Treating RN: Curtis Sites Primary Care Tacoya Altizer: Annita Brod Other Clinician: Referring Gwendalynn Eckstrom: Annita Brod Treating Asher Torpey/Extender: STONE III, HOYT Weeks in Treatment: 107 Vital Signs Time Taken: 10:47 Temperature (F): 98.3 Height (in): 70 Pulse (bpm): 89 Respiratory Rate  (breaths/min): 16 Blood Pressure (mmHg): 156/66 Reference Range: 80 - 120 mg / dl Electronic Signature(s) Signed: 06/15/2019 4:34:11 PM By: Curtis Sites Entered By: Curtis Sites on 06/15/2019 10:47:53

## 2019-06-15 NOTE — Progress Notes (Addendum)
Jessica [name], Addis W. ([id number]) Visit Report for 06/15/2019 Chief Complaint Document Details [name], [name] Date of Service: 06/15/2019 11:00 AM Medical Record Number: [id number] Patient Account Number: [id number] Date of Birth/Sex: Nov 28, 1945 (74 y.o. F) Treating RN: Rodell PernaScott, Dajea Primary Care Provider: Annita BrodASENSO, PHILIP Other Clinician: Referring Provider: Annita BrodASENSO, PHILIP Treating Provider/Extender: STONE III, Courtlynn Holloman Weeks in Treatment: 107 Information Obtained from: Patient Chief Complaint she is here for evaluation of a sacral ulcer Electronic Signature(s) Signed: 06/15/2019 11:36:10 AM By: Lenda KelpStone III, Aaima Gaddie PA-C Entered By: Lenda KelpStone III, Unita Detamore on 06/15/2019 11:36:10 Jessica [name], [name] ([id number]) -------------------------------------------------------------------------------- Debridement Details [name], [name] Date of Service: 06/15/2019 11:00 AM Medical Record Number: [id number] Patient Account Number: [id number] Date of Birth/Sex: Nov 28, 1945 (74 y.o. F) Treating RN: Rodell PernaScott, Dajea Primary Care Provider: Annita BrodASENSO, PHILIP Other Clinician: Referring Provider: Annita BrodASENSO, PHILIP Treating Provider/Extender: STONE III, Thereasa Iannello Weeks in Treatment: 107 Debridement Performed for Wound #6 Right Sacrum Assessment: Performed By: Physician STONE III, Greydon Betke E., PA-C Debridement Type: Debridement Level of Consciousness (Pre- Awake and Alert procedure): Pre-procedure Verification/Time Out Yes - 11:21 Taken: Start Time: 11:22 Pain Control: Lidocaine Total Area Debrided (L x W): 0.5 (cm) x 0.5 (cm) = 0.25 (cm) Tissue and other material debrided: Viable, Non-Viable, Subcutaneous, Skin: Dermis Level: Skin/Subcutaneous Tissue Debridement Description: Excisional Instrument: Forceps, Scissors Bleeding: Minimum Hemostasis Achieved: Pressure End Time: 11:23 Response to Treatment: Procedure was tolerated well Level of Consciousness (Post- Awake and Alert procedure): Post  Debridement Measurements of Total Wound Length: (cm) 1.1 Stage: Category/Stage III Width: (cm) 0.8 Depth: (cm) 0.3 Volume: (cm) 0.207 Character of Wound/Ulcer Post Debridement: Stable Post Procedure Diagnosis Same as Pre-procedure Electronic Signature(s) Signed: 06/15/2019 3:26:37 PM By: Rodell PernaScott, Dajea Signed: 06/15/2019 5:37:52 PM By: Lenda KelpStone III, Melvenia Favela PA-C Entered By: Rodell PernaScott, Dajea on 06/15/2019 11:24:33 Jessica [name], [name] ([id number]) -------------------------------------------------------------------------------- Debridement Details [name], [name] Date of Service: 06/15/2019 11:00 AM Medical Record Number: [id number] Patient Account Number: [id number] Date of Birth/Sex: Nov 28, 1945 (74 y.o. F) Treating RN: Rodell PernaScott, Dajea Primary Care Provider: Annita BrodASENSO, PHILIP Other Clinician: Referring Provider: Annita BrodASENSO, PHILIP Treating Provider/Extender: STONE III, Rosealynn Mateus Weeks in Treatment: 107 Debridement Performed for Wound #1 Medial Sacrum Assessment: Performed By: Physician STONE III, Shakeerah Gradel E., PA-C Debridement Type: Debridement Level of Consciousness (Pre- Awake and Alert procedure): Pre-procedure Verification/Time Out Yes - 11:21 Taken: Start Time: 11:22 Pain Control: Lidocaine Total Area Debrided (L x W): 0.5 (cm) x 0.5 (cm) = 0.25 (cm) Tissue and other material debrided: Viable, Non-Viable, Subcutaneous, Skin: Dermis Level: Skin/Subcutaneous Tissue Debridement Description: Excisional Instrument: Forceps, Scissors Bleeding: Minimum Hemostasis Achieved: Pressure End Time: 11:23 Response to Treatment: Procedure was tolerated well Level of Consciousness (Post- Awake and Alert procedure): Post Debridement Measurements of Total Wound Length: (cm) 5.5 Stage: Category/Stage IV Width: (cm) 4.4 Depth: (cm) 2 Volume: (cm) 38.013 Character of Wound/Ulcer Post Debridement: Stable Post Procedure Diagnosis Same as Pre-procedure Electronic Signature(s) Signed: 06/15/2019  3:26:37 PM By: Rodell PernaScott, Dajea Signed: 06/15/2019 5:37:52 PM By: Lenda KelpStone III, Deigo Alonso PA-C Entered By: Rodell PernaScott, Dajea on 06/15/2019 11:27:09 Jessica [name], [name] ([id number]) -------------------------------------------------------------------------------- HPI Details [name], [name] Date of Service: 06/15/2019 11:00 AM Medical Record Number: [id number] Patient Account Number: [id number] Date of Birth/Sex: Nov 28, 1945 (74 y.o. F) Treating RN: Rodell PernaScott, Dajea Primary Care Provider: Annita BrodASENSO, PHILIP Other Clinician: Referring Provider: Annita BrodASENSO, PHILIP Treating Provider/Extender: Linwood DibblesSTONE III, Lucus Lambertson Weeks in Treatment: 107 History of Present Illness HPI Description: 05/27/17-she is here in initial evaluation for a left-sided sacral stage IV pressure ulcer and bilateral lower  extremity, lateral aspect, unstageable pressure ulcers. She is accompanied by her husband and her son, who Jessica [name] her primary caregivers. She is bedbound secondary to spinal stenosis. According to her son and husband she was hospitalized from 10/5-11/1 with healthcare associated pneumonia and altered mental status. During her hospitalization she was intubated, extubated on 10/27. She was discharged with Foley catheter and follow up with urology. According to her son and spouse she developed the sacral ulcer during hospitalization. Home health has been applying Santyl daily. She does have a low air loss mattress and is repositioned every 2-3 hours per her family's report. According to the son and spouse she had an appointment with urology on 12/18 and during that appointment developed discoloration to her bilateral lower extremities which ultimately developed into unstageable pressure ulcers to the lateral aspects of her bilateral lower extremities. There has been no topical treatment applied to these. She continues to have home health. There is no concerns expressed regarding dietary intake, stating she eats 3 meals a day, eating was  provided; she is supplemented with boost with protein. 06/03/17-she is here in follow-up evaluation for sacral and bilateral lower extremity ulcers. Plain film x-ray done today reveals no distraction to the sacrum or coccyx, no visible abnormalities. Home health has ordered the negative pressure wound system but it has not been initiated. We will continue with Hydrofera Blue until initiation of negative pressure wound system and continue with Santyl to bilateral lower extremity ulcers. Follow-up next week 06/10/17-she is here in follow-up evaluation for sacral and bilateral lower extremity ulcers. The wound VAC will be available tomorrow per home health. We will initiate wound VAC therapy to the sacral ulcer 3 times weekly (Thursday, Saturday/Sunday, Tuesday). We will continue with Santyl to the lower extremity ulcers. The patient's son is checking into home health therapy over the weekend for [facility] changes, with the understanding that if VAC changes cannot be performed over the weekend he will need to change his mother's appointments to Monday, Wednesday or Friday. The sacral ulcer clinically does not appear infected but there has been a change in the amount of drainage acutely, there is no significant amount of devitalized tissue, there is no malodor. Wound culture was obtained to evaluate for occult infection we will hold off on antibiotic therapy until sensitivities Jessica [name] resulted. 06/18/17 on evaluation today patient appears to be doing fairly well in my opinion although this is the first time I have seen this patient she has been previously evaluated by Tacey Ruiz here in our office. She is going to be switching to Fridays to see me due to the Wound VAC schedule being changed on Monday, Wednesday, and Friday. Subsequently she seems to be doing fairly well with the Wound VAC. Her son who was present during evaluation today states that he somewhat stresses of the Wound VAC in making sure that it was functioning  appropriately. With that being said everything seems to be working well he knows what settings on the [facility] itself to look at and ensure that it is functioning properly. Overall the wound appears to be nice and clean there is no need for debridement today. She has no discomfort in her bilateral lower extremity ulcers also appear to be improving based on measurements and what this honest tell me about the overall appearance. 07/02/17 on evaluation today I noted in patients wound bed that was actually an odor that had not previously been noted. Subsequently there was also a small area of bone that was not  previously noted during my last evaluation. This did appear to be necrotic and was being somewhat forced out by the body around the region of granulation. She does not have any pain which is good news. Nonetheless the overall appearance of the ulcer is making me concerned for the patient having developed osteomyelitis. Currently she is not been on any antibiotics and the Wound VAC has been doing fairly well in general. With that being said I do not think we need to continue the Wound VAC if she potentially has a bone infection. At least not until it is properly addressed with antibiotics. 07/09/17 on evaluation today patient appears to actually be doing very well in regard to her sacral ulcer compared to last week. There is actually no exposed bone at this point. 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 With that being said still I do believe she needs to have a referral to infectious disease due to the osteomyelitis I am gonna recommend that she continue with the doxycycline based on the culture results which showed Eikenella Corrodens as the organism identified that tetracycline should work for. She does not seem to be having any discomfort  whatsoever at this point. 07/16/17 on evaluation today patient actually appears to be doing rather well in regard to her sacral wound. I think that the original wound site actually appears much better than previously noted. With that being said she does have a new superficial injury on the right sacral region which appears to be due to according to the sign transport that occurred for her MRI unfortunately. Fortunately this is not too deep and I do think it can be managed but it was a new area that was not previously noted. Otherwise she has been tolerating the Dakinos soaked gauze packing without complication. 07/30/17 on evaluation today patient presents for reevaluation concerning her sacral wound. She has been tolerating the dressing changes without complication. With that being said her wound is doing so well at this point that I think she may be at the point where we could reinitiate the Wound VAC currently and hopefully see good results from this. I'm very pleased with how she has responded to the Dakinos soaked gauze packing. 08/13/17 evaluation today patient appears to be doing excellent in regard to her lower extremity ulcer this seems to be cleaning up very nicely. In regard to the sacral ulcer the area of trauma on the right lateral portion of the wound actually appears to be much better than previously noted during the last evaluation. The word has definitely filled in and the Wound VAC appears to be helpful I do believe. Overall I'm very pleased with how things seem to be progressing at this time. Patient likewise is also happy that things Jessica [name] doing well. Jessica [name], [name] ([id number]) 08/27/17 on evaluation today patient presents for follow-up concerning her ongoing issues with her sacral ulcer and lower extremity ulcer. Fortunately she has been tolerating the dressing changes well complication the Wound VAC in general seems to have done very well up to this point. She does not have any  evidence of infection which is good news. She does have some dark discoloration in central portion of the wound which is troublesome for the possibility of pressure injury to the site it sounds like her prior air mattress was not functioning properly her son  has just bought a new one for her this is doing much better. Otherwise things seem to be progressing nicely. 09/10/17 on evaluation today patient presents for follow-up concerning her sacral wound and lower extremity ulcer. She has been tolerating the switch of her dressing changes to the Dakinos soaked gauze dressing very well in regard to the sacrum. The left lateral lower extremity ulcer appears to have a new area open just distal to the one that we have been treating with Santyl and this is new since her last visit. There does not appear to be any evidence of infection which is good news. With that being said there's a lot of maceration here at the site and I feel this is likely due to the switch and the dressings it appears that due to the sticking of the dressings it will switch to utilizing a telfa pad over the Santyl. I think this caused a lot of drainage to be collected and situated in the region just under the dressing which has led to this causing which duration of breakdown. Overall there does not appear to be any significant pain which is good news. Of note I did actually have a conversation with the radiologist who is an interventional radiologist with [facility]. 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 With that being said there does not appear to be any rush to get this done according to the interventional radiologist whom I spoke with. Therefore after discussion it was determined that there gonna wait about six months prior to considering the procedure to get time for hopefully the sacral wound to heal in the interim to at least some  degree. 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 According to nursing staff these Jessica [name] all improved, the negative pressure wound therapy system was put on hold last week. We will resume negative pressure wound therapy today, continue with Santyl to the lower extremity and she will follow-up next week. 09/24/17 on evaluation today unfortunately the patient's wound appears to be doing significantly worse compared to last week's evaluation and even my valuation the week before. She actually has bone noted on the left wound margin in the region of undermining unfortunately. This was not noted during the last evaluation. I am concerned about the fact that this seems to be worse not better since our last visit with her. My biggest concern is that she's likely developing a worsening osteomyelitis. This was discussed with patient and her son today during the office visit. 05/03/17 on evaluation today patient actually appears to be doing rather well in regard to her sacral ulcer compared to last week's evaluation. She has been tolerating the dressing changes without complication. Fortunately even though we're not utilizing the Wound VAC she seems to be making some strides in seeing the wound area overall improved quite dramatically in my pinion in just one weeks time. She also seems to be staying off of this to the point that I do not see any evidence of new injury which is excellent news. Whatever she's been doing in that regard over the past week I would want her to continue. I did also review the results of her bone culture which revealed group B strep as the responsible organism. Again this is what's causing her osteomyelitis she does have infectious disease appointment scheduled for 10/11/17 10/08/1898 valuation today patient appears to be doing better  for the most part in regard to the sacral wound. Overall she is showing signs of having granulation of the bone  which is good news. There is an area towards the left of the wound where she does seem to be showing some signs of opening this it would be due to additional pressure to the area unfortunately. There does not appear to be any evidence of infection spreading which is good news that do not see any evidence of significant purulent discharge which is also good news. 10/26/17 on evaluation today patient sacral ulcer actually appears to be very healthy and doing well in regard to the overall appearance of the wound. With that being said she does seem to be having some issues at this point in time with some shear/friction injury of the sacrum from where she was transported to [facility] for her infectious disease appointment. She has been placed on IV antibiotic therapy I will have to go back and find that note for review as I did not have access to that today. Nonetheless I will add the next visit be sure to document the exact antibiotics that she is taking at this point. Nonetheless I do believe the wound is doing better there's no bone exposure nothing that requires debridement in regard to the sacral wound. Likewise her left lateral lower extremity ulcer also appears to be doing significantly better. In general I feel like things Jessica [name] showing signs of improvement all around. 11/12/17 on evaluation today patient appears to be doing rather well in regard to her sacral wound. In general I do see signs of granulation at this point. Fortunately there does not appear to be any evidence of infection which is good news as well. In general overall happy with the appearance. With that being said I'll be see I would like for this to be progressing more rapidly as with the patient and her son but nonetheless at least things do not appear to be doing worse. Her lower extremity ulcer is doing significantly better. 11/26/17 on evaluation today patient appears to be doing a little better in regard to the sacral wound  especially in the peripheral of the wound bed. She actually did have an abrasion on the right gluteal region that's completely resolved to this point. Her lower extremity also appears to be for the most part completely resolved. Overall the sacrum itself seems to be the one thing that is still open and given her trouble. No fevers, chills, nausea, or vomiting noted at this time. She actually has an appointment next week with infectious disease for recheck to see how things Jessica [name] doing. She maybe switch to oral medications at that point. 12/09/17 on evaluation today patient actually appears to be doing much better in regard to her sacral wound. I do feel like she has made good progress at this time as far as healing is concerned. In fact the wound looks better to me today than it has in quite some time. She is on oral antibiotic therapy and no longer on the IV antibiotics. She did see infectious disease last week. 12/24/17 on evaluation today patient's wounds actually appear to be doing decently well in regard to the sacral wound in particular. When it comes to her lower extremity ulcers both appear to be completely healed at this point in the one area where she had lived the rash has completely resolved at this time. Overall very pleased with the progress that has been made. Nonetheless the patient seems to be  doing well in general. She still does have quite a bit of healing to do in regard to the sacral wound but I feel like she is making progress. 01/07/18 on evaluation today patient actually appears to be doing excellent today regard to her sacral wound. The granular quality in the base of the wound is excellent and shown signs of good improvement there's no evidence of contusion nor deep tissue injury and the periwound actually appears to be doing well. In general I'm very pleased with the overall appearance. 02/04/18 on evaluation today patient's wound actually appears to be doing decently well as far  as the granulation is concerned. She has been tolerating the dressing changes without complication. Right now we Jessica [name] still using the Wound VAC. Nonetheless overall there apparently is some cater AREYANA, LEONI. ([id number]) to the wound and this is concerning simply for the fact that she could be developing a soft tissue infection again. She's had this happen in the past and at that time responded very well to the antibiotics. Nonetheless I don't think I would likely put her on anything prophylactically but rather wait for the results of the culture to return. 02/18/18 on evaluation today patient actually appears to be doing fairly well. She has been tolerating the dressing changes. And the Wound VAC seems to be doing well. Overall I'm very pleased with how things have improved over the past couple weeks since I last saw her. Fortunately there does not appear to be any evidence of overt infection at this time which is good news. She has been taking the antibiotics without complication. Specifically that is the Bactrim DS which was prescribed on 02/08/18. 03/18/18 on evaluation today patient's wound actually does have quite a bit of odor noted compared to previous. With that being said the wound itself overall does not appear to be too significant or worse. There is no event noted in the wound bed which is good news. With that being said the patient has been worried as the home health nurse stated that she thought she saw bone in the base of the wound. Nonetheless there's a little bit of bruising along the left lateral border of the wound bed which again does not appear to be terrible we have noticed this before but other than this the majority the wound bed appears to be doing excellent in my pinion. I am wondering if there may be some bacterial colonization. This could be leading to some of the odor that we Jessica [name] noting with the Wound VAC. 04/01/18 on evaluation today patient appears to actually be  doing well in regard to the overall appearance of the wound itself. She also does not have any odor at this point in regard to the wound surface and I do believe the Bacons is extremely helpful in this regard. With that being said she does have a little bit of deep tissue injury on the medial aspect of the wound. Again last time it was more lateral. The lateral seems to have improved quite well. Nonetheless she also notes by way of her son that this has been very macerated in the past several weeks since I last saw her. Though things overall seem to be doing better I still think that the Wound VAC may help with managing her fluid better I'm wondering if we can attempt a gauze Wound VAC my hope is that this will be able to manage the moisture control as well as continuing with managing the odor of the  wound and bacterial load since the Dakinos has done excellent in helping in that regard. 04/15/17 on evaluation today patient actually appears to be doing much better in regard to the overall appearance of her sacral wound. I do believe that the gauze Vac has been of benefit for her. Overall she is tolerating this without complication which is also good news. There is no sign of injury to the sacral region at this point. 04/29/18 on evaluation today patient appears to be doing well in regard to her sacral ulcer. She sure signs of good granulation and the Gauze VAC is doing very well for her. Overall I'm pleased in this regard. The biggest issue is that the time from Friday till Monday seems to be quite long for her as far as the amount of time to keep the Wound VAC sealed. For that reason her son wonders if he can take this off on Sunday morning in order to give her day of just wet to dry dressing's with the Dakin's solution I think this would be okay. 05/13/18 on evaluation today patient appears to be doing okay in regard to her sacral ulcer. There does not appear to be any evidence of overt infection at  this time which is good news. In general the drainage and moisture appears to be significantly improved this was after the pressure on the Wound VAC was increased after they contacted our office. With that being said a lot of the dark tissue around the edge of the wound has improved in the maceration is significantly better this seems to be managing much better. Fortunately there is no signs of infection at this time. Overall the patient is doing quite well. The wound measurements Jessica [name] not significantly smaller today although they were over the past two visits we will see how things go. 05/27/18 on evaluation today patient's sacral wound actually appears to be doing fairly well at this point. She does have a deep tissue injury yet again on the medial aspect of the wound which is something that we seem to be seeing often. There is some question as to whether or not this is actually being caused by the Wound VAC bunching up and then her somewhat laying on it even though they Jessica [name] attempting to offload this as best as possible. Patient son states that overall they do the best they can trying offload her and I definitely believe him. I may want to see about putting the Wound VAC on hold for a couple weeks and see if this happens just with standard dressing changes. 06/10/18 on evaluation today patient actually appears to be doing better in regard to her sacral wound. Overall there's no signs of infection, no odor, and no significant skin breakdown around the edge of the wound I feel like this is done much better 50 Dakinos soaked gauze dressing as opposed to legalizing anything such as the Wound VAC. I believe that we discontinue the Wound VAC as of today. 06/24/18 on evaluation today patient actually appears to be doing well in regard to her sacral ulcer. There's no signs of infection, no odor, and in my pinion no concerns at all. I feel like she is making good progress as far as I'm concerned. 07/12/18 on  evaluation today patient appears to be doing very well in regard to her sacral ulcer. She has been tolerating the dressing changes without complication. Fortunately there does not appear to be any evidence of active infection at this time she is doing  excellent as far as I'm concerned at this point. 08/11/18 on evaluation today patient's sacral wound actually appears to be doing quite well at this point. The main issue I see is that she continues to have some maceration and skin breakdown due to moisture over the right periwound location unfortunately. We've investigated the possibility of using zinc oxide before but have not utilized it simply due to the fact that she continues to state. Her son anyways that she is allergic to this. They have not tried it recently and may be completely unrelated to the reaction she had before. Nonetheless this may be something you want to attempt will discuss that in the plan. 10/04/18 on evaluation today patient actually appears to be doing quite well in regard to her sacral wound. It's been roughly 2 months since I last seen her in that time her wound has definitely made progress. This is very slow but again nonetheless does seem to be trending in the correct direction. Overall very pleased in this regard. No fevers, chills, nausea, or vomiting noted at this time. 11/07/2018 on evaluation today patient appears to be doing decently well with regard to her sacral wound based on what I am seeing at this point. She is seen by way of a telehealth visit due to the COVID-19 national emergency and to be honest the patient is somewhat reluctant to come out to be seen for visits which I completely understand. They feel much safer keeping her home as much as possible due to her adequate condition in general. The only issue that she has had recently she did have some wheezing and subsequently chest congestion she was just placed on albuterol along with prednisone at this point.  She seems to be doing much better even after 24 hours of the prednisone according to her [name]. Overall they Jessica [name] pleased with how things seem to be progressing. TAEGAN, HAIDER ([id number]) 11/28/2018 upon evaluation today patient is seen by way of telehealth visit since she is still somewhat reluctant to come out due to the COVID-19 pandemic. Subsequently she seems to be doing well and infected I could not obtain actual measurements for the wound the overall appearance of the wound is dramatically improved. She does not appear to have nearly as much space at the 12:00 location and in fact her [name] who is taking care of her mainly states that he is barely able to get anything up into that region is filled and so nicely. Overall I feel like she is making great progress. 12/12/2018 patient is seen today via a telehealth visit again secondary to her concerns about coming out during the COVID-19 national pandemic. Nonetheless she appears to be doing quite well at this time which is good news. There is no signs of active infection at this time. Overall patient seems to be very pleased as is her son who takes very good care of her. 01/16/2019 on evaluation today patient actually appears to be doing very well with regard to her wound. We have been doing zoom calls quite a bit since the COVID-19 pandemic began just due to the fact that she has been somewhat concerned about coming out. With that being said she seems to have done very well and in fact the wound is measuring quite a bit smaller today compared to the last measurement. Fortunately there is no signs of active infection at this time. She is also having great improvement overall in her demeanor she is very upbeat and  pleased with how things have progressed. 02/21/2019 upon evaluation today patient actually appears to be doing very well with regard to her wound. She has been tolerating the dressing changes her sons been performing these  without complication and seems to do an excellent job in my opinion. I am actually very pleased with the overall appearance. The wound is significantly smaller compared to what its been in the past by quite a bit. 03/21/19 on evaluation today patient appears to be doing excellent in regard to her wound. She's been tolerating the dressing changes without complication and there does not appear to be evidence of active infection which is good news. She is still tolerating the Dakinos Moistened gauze packing without complication. 04/18/19 on evaluation today patient is seen by way of a telehealth visit due to her concerns of being exposed to Covid-19 Virus if she comes into the clinic. Subsequently her son is present to help with the visit today. They have been performing the dressing changes specifically Dakins soloution at this point in order to help with granulation in the base of the wound. Fortunately there is no evidence of active infection currently. Her son does note that he feels like maybe some bone in the base of the wound centrally that he is somewhat concerned about. With that being said it's not entirely visible but he states that he can palpate it. 05/25/2019 upon evaluation today patient appears to be doing very well in fact in regard to her wounds. I am very pleased with how things seem to be progressing and though she is not showing any signs of infection I do believe that it may be time to switch the dressing to something a little different I think she has a lot of irritation in the periwound and this is likely coming some from the fact that she does have a lot of moisture. I think the wound is small enough we can see about initiation of a silver alginate dressing. I think this will help keep the periwound from breaking down. Overall however I am very pleased with how things stand. 06/15/2019 upon evaluation today patient appears to be doing very well with regard to her wound. Overall the  periwound also seems to be doing much better. She does have several filament-like projections of her skin on the periwound region that been present for some time unfortunately they keep getting pulled and stuck to the dressing causing this area to bleed. I think we Jessica [name] getting need to shave/trim these down today to help her to more fully heal in this periwound location so this would not continue to be an issue for her. Electronic Signature(s) Signed: 06/15/2019 5:34:39 PM By: Lenda Kelp PA-C Entered By: Lenda Kelp on 06/15/2019 17:34:38 Jessica [name] ([id number]) -------------------------------------------------------------------------------- Physical Exam Details [name]. Date of Service: 06/15/2019 11:00 AM Medical Record Number: [id number] Patient Account Number: [id number] Date of Birth/Sex: 04/28/1945 (74 y.o. F) Treating RN: Rodell Perna Primary Care Provider: Annita Brod Other Clinician: Referring Provider: Annita Brod Treating Provider/Extender: STONE III, Artavia Jeanlouis Weeks in Treatment: 107 Constitutional Obese and well-hydrated in no acute distress. Respiratory normal breathing without difficulty. Psychiatric this patient is able to make decisions and demonstrates good insight into disease process. Alert and Oriented x 3. pleasant and cooperative. Notes Upon inspection patient's wound itself appears to be doing well however the 2 wounds did have several projections around them where there were skin filaments sticking up that honestly flat back and forth  and at times were getting stuck to the dressings and causing this to tear. With that in mind I did discuss with the patient and her son today trimming these off which they both feel is a good idea. We did therefore proceed to do so and both areas had several spots that were trimmed away. Post debridement which include skin and subcutaneous tissue the patient's periwound seem to be doing much  better silver nitrate was used in a couple locations to stop bleeding. We will continue with the alginate dressing as I think that is doing well. Electronic Signature(s) Signed: 06/15/2019 5:35:33 PM By: Lenda Kelp PA-C Entered By: Lenda Kelp on 06/15/2019 17:35:33 Jessica [name] ([id number]) -------------------------------------------------------------------------------- Physician Orders Details [name]. Date of Service: 06/15/2019 11:00 AM Medical Record Number: [id number] Patient Account Number: [id number] Date of Birth/Sex: May 09, 1945 (74 y.o. F) Treating RN: Rodell Perna Primary Care Provider: Annita Brod Other Clinician: Referring Provider: Annita Brod Treating Provider/Extender: Linwood Dibbles, Riverlyn Kizziah Weeks in Treatment: 107 Verbal / Phone Orders: No Diagnosis Coding ICD-10 Coding Code Description L89.154 Pressure ulcer of sacral region, stage 4 M48.00 Spinal stenosis, site unspecified R54 Age-related physical debility G89.4 Chronic pain syndrome E78.2 Mixed hyperlipidemia L89.93 Pressure ulcer of unspecified site, stage 3 Wound Cleansing Wound #1 Medial Sacrum o Other: - please cleanse sacral wound with dakins moistened gauze - do not spray dakins on wound Wound #6 Right Sacrum o Other: - please cleanse sacral wound with dakins moistened gauze - do not spray dakins on wound Anesthetic (add to Medication List) Wound #1 Medial Sacrum o Topical Lidocaine 4% cream applied to wound bed prior to debridement (In Clinic Only). Wound #6 Right Sacrum o Topical Lidocaine 4% cream applied to wound bed prior to debridement (In Clinic Only). Skin Barriers/Peri-Wound Care Wound #1 Medial Sacrum o Skin Prep Wound #6 Right Sacrum o Skin Prep Primary Wound Dressing Wound #1 Medial Sacrum o Alginate - with dry gauze behind alginate to fill space Wound #6 Right Sacrum o Alginate - with dry gauze behind alginate to fill space Secondary  Dressing Wound #1 Medial Sacrum o ABD pad - secure with tape Wound #6 Right Sacrum o ABD pad - secure with tape Dressing Change Frequency Wound #1 Medial Sacrum o Change dressing twice daily. Wound #6 Right Sacrum o Change dressing twice daily. Jessica [name], Jessica [name] ([id number]) Follow-up Appointments Wound #1 Medial Sacrum o Return Appointment in 2 weeks. Wound #6 Right Sacrum o Return Appointment in 2 weeks. Off-Loading Wound #1 Medial Sacrum o Turn and reposition every 2 hours Wound #6 Right Sacrum o Turn and reposition every 2 hours Additional Orders / Instructions Wound #1 Medial Sacrum o Increase protein intake. Wound #6 Right Sacrum o Increase protein intake. Home Health Wound #1 Medial Sacrum o Continue Home Health Visits - [facility] o Home Health Nurse may visit PRN to address patientos wound care needs. o FACE TO FACE ENCOUNTER: MEDICARE and MEDICAID PATIENTS: I certify that this patient is under my care and that I had a face-to- face encounter that meets the physician face-to-face encounter requirements with this patient on this date. The encounter with the patient was in whole or in part for the following MEDICAL CONDITION: (primary reason for Home Healthcare) MEDICAL NECESSITY: I certify, that based on my findings, NURSING services Jessica [name] a medically necessary home health service. HOME BOUND STATUS: I certify that my clinical findings support that this patient is homebound (i.e., Due to illness or injury, pt requires  aid of supportive devices such as crutches, cane, wheelchairs, walkers, the use of special transportation or the assistance of another person to leave their place of residence. There is a normal inability to leave the home and doing so requires considerable and taxing effort. Other absences Jessica [name] for medical reasons / religious services and Jessica [name] infrequent or of short duration when for other reasons). o If current dressing causes  regression in wound condition, may D/C ordered dressing product/s and apply Normal Saline Moist Dressing daily until next Wound Healing Center / Other MD appointment. Notify Wound Healing Center of regression in wound condition at [phone]. o Please direct any NON-WOUND related issues/requests for orders to patient's Primary Care Physician Wound #6 Right Sacrum o Continue Home Health Visits - [facility] o Home Health Nurse may visit PRN to address patientos wound care needs. o FACE TO FACE ENCOUNTER: MEDICARE and MEDICAID PATIENTS: I certify that this patient is under my care and that I had a face-to- face encounter that meets the physician face-to-face encounter requirements with this patient on this date. The encounter with the patient was in whole or in part for the following MEDICAL CONDITION: (primary reason for Home Healthcare) MEDICAL NECESSITY: I certify, that based on my findings, NURSING services Jessica [name] a medically necessary home health service. HOME BOUND STATUS: I certify that my clinical findings support that this patient is homebound (i.e., Due to illness or injury, pt requires aid of supportive devices such as crutches, cane, wheelchairs, walkers, the use of special transportation or the assistance of another person to leave their place of residence. There is a normal inability to leave the home and doing so requires considerable and taxing effort. Other absences Jessica [name] for medical reasons / religious services and Jessica [name] infrequent or of short duration when for other reasons). o If current dressing causes regression in wound condition, may D/C ordered dressing product/s and apply Normal Saline Moist Dressing daily until next Wound Healing Center / Other MD appointment. Notify Wound Healing Center of regression in wound condition at [phone]. o Please direct any NON-WOUND related issues/requests for orders to patient's Primary Care Physician Electronic Signature(s) Signed:  06/15/2019 3:26:37 PM By: Rodell Perna Signed: 06/15/2019 5:37:52 PM By: Lenda Kelp PA-C Entered By: Rodell Perna on 06/15/2019 11:27:50 Jessica [name] ([id number]) -------------------------------------------------------------------------------- Problem List Details [name]. Date of Service: 06/15/2019 11:00 AM Medical Record Number: [id number] Patient Account Number: [id number] Date of Birth/Sex: 1945/06/12 (74 y.o. F) Treating RN: Rodell Perna Primary Care Provider: Annita Brod Other Clinician: Referring Provider: Annita Brod Treating Provider/Extender: Linwood Dibbles, Claxton Levitz Weeks in Treatment: 107 Active Problems ICD-10 Evaluated Encounter Code Description Active Date Today Diagnosis L89.154 Pressure ulcer of sacral region, stage 4 05/27/2017 No Yes M48.00 Spinal stenosis, site unspecified 05/27/2017 No Yes R54 Age-related physical debility 05/27/2017 No Yes G89.4 Chronic pain syndrome 05/27/2017 No Yes E78.2 Mixed hyperlipidemia 05/27/2017 No Yes L89.93 Pressure ulcer of unspecified site, stage 3 05/27/2017 No Yes Inactive Problems Resolved Problems Electronic Signature(s) Signed: 06/15/2019 11:15:57 AM By: Lenda Kelp PA-C Entered By: Lenda Kelp on 06/15/2019 11:15:56 Jessica [name] ([id number]) -------------------------------------------------------------------------------- Progress Note Details [name]. Date of Service: 06/15/2019 11:00 AM Medical Record Number: [id number] Patient Account Number: [id number] Date of Birth/Sex: 1946/01/24 (74 y.o. F) Treating RN: Rodell Perna Primary Care Provider: Annita Brod Other Clinician: Referring Provider: Annita Brod Treating Provider/Extender: STONE III, Florentina Marquart Weeks in Treatment: 107 Subjective Chief Complaint Information obtained from Patient she is here  for evaluation of a sacral ulcer History of Present Illness (HPI) 05/27/17-she is here in initial evaluation for  a left-sided sacral stage IV pressure ulcer and bilateral lower extremity, lateral aspect, unstageable pressure ulcers. She is accompanied by her husband and her son, who Jessica [name] her primary caregivers. She is bedbound secondary to spinal stenosis. According to her son and husband she was hospitalized from 10/5-11/1 with healthcare associated pneumonia and altered mental status. During her hospitalization she was intubated, extubated on 10/27. She was discharged with Foley catheter and follow up with urology. According to her son and spouse she developed the sacral ulcer during hospitalization. Home health has been applying Santyl daily. She does have a low air loss mattress and is repositioned every 2-3 hours per her family's report. According to the son and spouse she had an appointment with urology on 12/18 and during that appointment developed discoloration to her bilateral lower extremities which ultimately developed into unstageable pressure ulcers to the lateral aspects of her bilateral lower extremities. There has been no topical treatment applied to these. She continues to have home health. There is no concerns expressed regarding dietary intake, stating she eats 3 meals a day, eating was provided; she is supplemented with boost with protein. 06/03/17-she is here in follow-up evaluation for sacral and bilateral lower extremity ulcers. Plain film x-ray done today reveals no distraction to the sacrum or coccyx, no visible abnormalities. Home health has ordered the negative pressure wound system but it has not been initiated. We will continue with Hydrofera Blue until initiation of negative pressure wound system and continue with Santyl to bilateral lower extremity ulcers. Follow-up next week 06/10/17-she is here in follow-up evaluation for sacral and bilateral lower extremity ulcers. The wound VAC will be available tomorrow per home health. We will initiate wound VAC therapy to the sacral ulcer 3  times weekly (Thursday, Saturday/Sunday, Tuesday). We will continue with Santyl to the lower extremity ulcers. The patient's son is checking into home health therapy over the weekend for [facility] changes, with the understanding that if VAC changes cannot be performed over the weekend he will need to change his mother's appointments to Monday, Wednesday or Friday. The sacral ulcer clinically does not appear infected but there has been a change in the amount of drainage acutely, there is no significant amount of devitalized tissue, there is no malodor. Wound culture was obtained to evaluate for occult infection we will hold off on antibiotic therapy until sensitivities Jessica [name] resulted. 06/18/17 on evaluation today patient appears to be doing fairly well in my opinion although this is the first time I have seen this patient she has been previously evaluated by Tacey Ruiz here in our office. She is going to be switching to Fridays to see me due to the Wound VAC schedule being changed on Monday, Wednesday, and Friday. Subsequently she seems to be doing fairly well with the Wound VAC. Her son who was present during evaluation today states that he somewhat stresses of the Wound VAC in making sure that it was functioning appropriately. With that being said everything seems to be working well he knows what settings on the [facility] itself to look at and ensure that it is functioning properly. Overall the wound appears to be nice and clean there is no need for debridement today. She has no discomfort in her bilateral lower extremity ulcers also appear to be improving based on measurements and what this honest tell me about the overall appearance. 07/02/17 on evaluation today I  noted in patients wound bed that was actually an odor that had not previously been noted. Subsequently there was also a small area of bone that was not previously noted during my last evaluation. This did appear to be necrotic and was being somewhat forced out by  the body around the region of granulation. She does not have any pain which is good news. Nonetheless the overall appearance of the ulcer is making me concerned for the patient having developed osteomyelitis. Currently she is not been on any antibiotics and the Wound VAC has been doing fairly well in general. With that being said I do not think we need to continue the Wound VAC if she potentially has a bone infection. At least not until it is properly addressed with antibiotics. 07/09/17 on evaluation today patient appears to actually be doing very well in regard to her sacral ulcer compared to last week. There is actually no exposed bone at this point. 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 With that being said still I do believe she needs to have a referral to infectious disease due to the osteomyelitis I am gonna recommend that she continue with the doxycycline based on the culture results which showed Eikenella Corrodens as the organism identified that tetracycline should work for. She does not seem to be having any discomfort whatsoever at this point. 07/16/17 on evaluation today patient actually appears to be doing rather well in regard to her sacral wound. I think that the original wound site actually appears much better than previously noted. With that being said she does have a new superficial injury on the right sacral region which appears to be due to according to the sign transport that occurred for her MRI unfortunately. Fortunately this is not too deep and I do think it can be managed but it was a new area that was not previously noted. Otherwise she has been tolerating the Dakin s soaked gauze packing without complication. 07/30/17 on evaluation today patient presents for reevaluation concerning her sacral wound. She has been  tolerating the dressing changes without complication. With that being said her wound is doing so well at this point that I think she may be at the point where we could reinitiate the Wound VAC currently and hopefully see good results from this. I'm very pleased with how she has responded to the [facility] s soaked gauze packing. MAILEE, KLAAS ([id number]) 08/13/17 evaluation today patient appears to be doing excellent in regard to her lower extremity ulcer this seems to be cleaning up very nicely. In regard to the sacral ulcer the area of trauma on the right lateral portion of the wound actually appears to be much better than previously noted during the last evaluation. The word has definitely filled in and the Wound VAC appears to be helpful I do believe. Overall I'm very pleased with how things seem to be progressing at this time. Patient likewise is also happy that things Jessica [name] doing well. 08/27/17 on evaluation today patient presents for follow-up concerning her ongoing issues with her sacral ulcer and lower extremity ulcer. Fortunately she has been tolerating the dressing changes well complication the Wound VAC in general seems to have done very well up to this point. She does not have any evidence of infection which is good news. She does have some dark discoloration in  central portion of the wound which is troublesome for the possibility of pressure injury to the site it sounds like her prior air mattress was not functioning properly her son has just bought a new one for her this is doing much better. Otherwise things seem to be progressing nicely. 09/10/17 on evaluation today patient presents for follow-up concerning her sacral wound and lower extremity ulcer. She has been tolerating the switch of her dressing changes to the Dakin s soaked gauze dressing very well in regard to the sacrum. The left lateral lower extremity ulcer appears to have a new area open just distal to the one that we have been  treating with Santyl and this is new since her last visit. There does not appear to be any evidence of infection which is good news. With that being said there's a lot of maceration here at the site and I feel this is likely due to the switch and the dressings it appears that due to the sticking of the dressings it will switch to utilizing a telfa pad over the Santyl. I think this caused a lot of drainage to be collected and situated in the region just under the dressing which has led to this causing which duration of breakdown. Overall there does not appear to be any significant pain which is good news. Of note I did actually have a conversation with the radiologist who is an interventional radiologist with [facility]. 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 With that being said there does not appear to be any rush to get this done according to the interventional radiologist whom I spoke with. Therefore after discussion it was determined that there gonna wait about six months prior to considering the procedure to get time for hopefully the sacral wound to heal in the interim to at least some degree. 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 According to nursing staff these Jessica [name] all improved, the negative pressure wound therapy system was put on hold last week. We will resume negative pressure wound therapy today, continue with Santyl to the lower extremity and she will follow-up next week. 09/24/17 on evaluation today unfortunately the patient's wound appears to be doing significantly worse compared to last week's evaluation and even my valuation the week before. She actually has bone noted on the left wound margin in the region of undermining unfortunately. This was not noted during the last evaluation. I am concerned about the fact that this seems to be worse not better  since our last visit with her. My biggest concern is that she's likely developing a worsening osteomyelitis. This was discussed with patient and her son today during the office visit. 05/03/17 on evaluation today patient actually appears to be doing rather well in regard to her sacral ulcer compared to last week's evaluation. She has been tolerating the dressing changes without complication. Fortunately even though we're not utilizing the Wound VAC she seems to be making some strides in seeing the wound area overall improved quite dramatically in my pinion in just one weeks time. She also seems to be staying off of this to the point that I do not see any evidence of new injury which is excellent news. Whatever she's been doing in that regard over the past week I would want her to continue. I did also review the results of her bone culture which revealed  group B strep as the responsible organism. Again this is what's causing her osteomyelitis she does have infectious disease appointment scheduled for 10/11/17 10/08/1898 valuation today patient appears to be doing better for the most part in regard to the sacral wound. Overall she is showing signs of having granulation of the bone which is good news. There is an area towards the left of the wound where she does seem to be showing some signs of opening this it would be due to additional pressure to the area unfortunately. There does not appear to be any evidence of infection spreading which is good news that do not see any evidence of significant purulent discharge which is also good news. 10/26/17 on evaluation today patient sacral ulcer actually appears to be very healthy and doing well in regard to the overall appearance of the wound. With that being said she does seem to be having some issues at this point in time with some shear/friction injury of the sacrum from where she was transported to [facility] for her infectious disease appointment. She has been  placed on IV antibiotic therapy I will have to go back and find that note for review as I did not have access to that today. Nonetheless I will add the next visit be sure to document the exact antibiotics that she is taking at this point. Nonetheless I do believe the wound is doing better there's no bone exposure nothing that requires debridement in regard to the sacral wound. Likewise her left lateral lower extremity ulcer also appears to be doing significantly better. In general I feel like things Jessica [name] showing signs of improvement all around. 11/12/17 on evaluation today patient appears to be doing rather well in regard to her sacral wound. In general I do see signs of granulation at this point. Fortunately there does not appear to be any evidence of infection which is good news as well. In general overall happy with the appearance. With that being said I'll be see I would like for this to be progressing more rapidly as with the patient and her son but nonetheless at least things do not appear to be doing worse. Her lower extremity ulcer is doing significantly better. 11/26/17 on evaluation today patient appears to be doing a little better in regard to the sacral wound especially in the peripheral of the wound bed. She actually did have an abrasion on the right gluteal region that's completely resolved to this point. Her lower extremity also appears to be for the most part completely resolved. Overall the sacrum itself seems to be the one thing that is still open and given her trouble. No fevers, chills, nausea, or vomiting noted at this time. She actually has an appointment next week with infectious disease for recheck to see how things Jessica [name] doing. She maybe switch to oral medications at that point. 12/09/17 on evaluation today patient actually appears to be doing much better in regard to her sacral wound. I do feel like she has made good progress at this time as far as healing is concerned. In fact the  wound looks better to me today than it has in quite some time. She is on oral antibiotic therapy and no longer on the IV antibiotics. She did see infectious disease last week. 12/24/17 on evaluation today patient's wounds actually appear to be doing decently well in regard to the sacral wound in particular. When it comes to her lower extremity ulcers both appear to be completely healed at this point  in the one area where she had lived the rash has completely resolved at this time. Overall very pleased with the progress that has been made. Nonetheless the patient seems to be doing well in general. She still does have quite a bit of healing to do in regard to the sacral wound but I feel like she is making progress. 01/07/18 on evaluation today patient actually appears to be doing excellent today regard to her sacral wound. The granular quality in the base of the [facility], CIENNA DUMAIS. ([id number]) wound is excellent and shown signs of good improvement there's no evidence of contusion nor deep tissue injury and the periwound actually appears to be doing well. In general I'm very pleased with the overall appearance. 02/04/18 on evaluation today patient's wound actually appears to be doing decently well as far as the granulation is concerned. She has been tolerating the dressing changes without complication. Right now we Jessica [name] still using the Wound VAC. Nonetheless overall there apparently is some cater to the wound and this is concerning simply for the fact that she could be developing a soft tissue infection again. She's had this happen in the past and at that time responded very well to the antibiotics. Nonetheless I don't think I would likely put her on anything prophylactically but rather wait for the results of the culture to return. 02/18/18 on evaluation today patient actually appears to be doing fairly well. She has been tolerating the dressing changes. And the Wound VAC seems to be doing well. Overall  I'm very pleased with how things have improved over the past couple weeks since I last saw her. Fortunately there does not appear to be any evidence of overt infection at this time which is good news. She has been taking the antibiotics without complication. Specifically that is the Bactrim DS which was prescribed on 02/08/18. 03/18/18 on evaluation today patient's wound actually does have quite a bit of odor noted compared to previous. With that being said the wound itself overall does not appear to be too significant or worse. There is no event noted in the wound bed which is good news. With that being said the patient has been worried as the home health nurse stated that she thought she saw bone in the base of the wound. Nonetheless there's a little bit of bruising along the left lateral border of the wound bed which again does not appear to be terrible we have noticed this before but other than this the majority the wound bed appears to be doing excellent in my pinion. I am wondering if there may be some bacterial colonization. This could be leading to some of the odor that we Jessica [name] noting with the Wound VAC. 04/01/18 on evaluation today patient appears to actually be doing well in regard to the overall appearance of the wound itself. She also does not have any odor at this point in regard to the wound surface and I do believe the Bacons is extremely helpful in this regard. With that being said she does have a little bit of deep tissue injury on the medial aspect of the wound. Again last time it was more lateral. The lateral seems to have improved quite well. Nonetheless she also notes by way of her son that this has been very macerated in the past several weeks since I last saw her. Though things overall seem to be doing better I still think that the Wound VAC may help with managing her fluid better I'm wondering  if we can attempt a gauze Wound VAC my hope is that this will be able to manage the  moisture control as well as continuing with managing the odor of the wound and bacterial load since the [place] s has done excellent in helping in that regard. 04/15/17 on evaluation today patient actually appears to be doing much better in regard to the overall appearance of her sacral wound. I do believe that the gauze Vac has been of benefit for her. Overall she is tolerating this without complication which is also good news. There is no sign of injury to the sacral region at this point. 04/29/18 on evaluation today patient appears to be doing well in regard to her sacral ulcer. She sure signs of good granulation and the Gauze VAC is doing very well for her. Overall I'm pleased in this regard. The biggest issue is that the time from Friday till Monday seems to be quite long for her as far as the amount of time to keep the Wound VAC sealed. For that reason her son wonders if he can take this off on Sunday morning in order to give her day of just wet to dry dressing's with the Dakin's solution I think this would be okay. 05/13/18 on evaluation today patient appears to be doing okay in regard to her sacral ulcer. There does not appear to be any evidence of overt infection at this time which is good news. In general the drainage and moisture appears to be significantly improved this was after the pressure on the Wound VAC was increased after they contacted our office. With that being said a lot of the dark tissue around the edge of the wound has improved in the maceration is significantly better this seems to be managing much better. Fortunately there is no signs of infection at this time. Overall the patient is doing quite well. The wound measurements Jessica [name] not significantly smaller today although they were over the past two visits we will see how things go. 05/27/18 on evaluation today patient's sacral wound actually appears to be doing fairly well at this point. She does have a deep tissue injury yet  again on the medial aspect of the wound which is something that we seem to be seeing often. There is some question as to whether or not this is actually being caused by the Wound VAC bunching up and then her somewhat laying on it even though they Jessica [name] attempting to offload this as best as possible. Patient son states that overall they do the best they can trying offload her and I definitely believe him. I may want to see about putting the Wound VAC on hold for a couple weeks and see if this happens just with standard dressing changes. 06/10/18 on evaluation today patient actually appears to be doing better in regard to her sacral wound. Overall there's no signs of infection, no odor, and no significant skin breakdown around the edge of the wound I feel like this is done much better 50 Dakin s soaked gauze dressing as opposed to legalizing anything such as the Wound VAC. I believe that we discontinue the Wound VAC as of today. 06/24/18 on evaluation today patient actually appears to be doing well in regard to her sacral ulcer. There's no signs of infection, no odor, and in my pinion no concerns at all. I feel like she is making good progress as far as I'm concerned. 07/12/18 on evaluation today patient appears to be doing very well  in regard to her sacral ulcer. She has been tolerating the dressing changes without complication. Fortunately there does not appear to be any evidence of active infection at this time she is doing excellent as far as I'm concerned at this point. 08/11/18 on evaluation today patient's sacral wound actually appears to be doing quite well at this point. The main issue I see is that she continues to have some maceration and skin breakdown due to moisture over the right periwound location unfortunately. We've investigated the possibility of using zinc oxide before but have not utilized it simply due to the fact that she continues to state. Her son anyways that she is allergic to this.  They have not tried it recently and may be completely unrelated to the reaction she had before. Nonetheless this may be something you want to attempt will discuss that in the plan. 10/04/18 on evaluation today patient actually appears to be doing quite well in regard to her sacral wound. It's been roughly 2 months since I last seen her in that time her wound has definitely made progress. This is very slow but again nonetheless does seem to be trending in the correct direction. Overall very pleased in this regard. No fevers, chills, nausea, or vomiting noted at this time. 11/07/2018 on evaluation today patient appears to be doing decently well with regard to her sacral wound based on what I am seeing at this point. She JULIE-ANNE, TORAIN ([id number]) is seen by way of a telehealth visit due to the COVID-19 national emergency and to be honest the patient is somewhat reluctant to come out to be seen for visits which I completely understand. They feel much safer keeping her home as much as possible due to her adequate condition in general. The only issue that she has had recently she did have some wheezing and subsequently chest congestion she was just placed on albuterol along with prednisone at this point. She seems to be doing much better even after 24 hours of the prednisone according to her [name]. Overall they Jessica [name] pleased with how things seem to be progressing. 11/28/2018 upon evaluation today patient is seen by way of telehealth visit since she is still somewhat reluctant to come out due to the COVID-19 pandemic. Subsequently she seems to be doing well and infected I could not obtain actual measurements for the wound the overall appearance of the wound is dramatically improved. She does not appear to have nearly as much space at the 12:00 location and in fact her [name] who is taking care of her mainly states that he is barely able to get anything up into that region is filled and so nicely.  Overall I feel like she is making great progress. 12/12/2018 patient is seen today via a telehealth visit again secondary to her concerns about coming out during the COVID-19 national pandemic. Nonetheless she appears to be doing quite well at this time which is good news. There is no signs of active infection at this time. Overall patient seems to be very pleased as is her son who takes very good care of her. 01/16/2019 on evaluation today patient actually appears to be doing very well with regard to her wound. We have been doing zoom calls quite a bit since the COVID-19 pandemic began just due to the fact that she has been somewhat concerned about coming out. With that being said she seems to have done very well and in fact the wound is measuring quite a  bit smaller today compared to the last measurement. Fortunately there is no signs of active infection at this time. She is also having great improvement overall in her demeanor she is very upbeat and pleased with how things have progressed. 02/21/2019 upon evaluation today patient actually appears to be doing very well with regard to her wound. She has been tolerating the dressing changes her sons been performing these without complication and seems to do an excellent job in my opinion. I am actually very pleased with the overall appearance. The wound is significantly smaller compared to what its been in the past by quite a bit. 03/21/19 on evaluation today patient appears to be doing excellent in regard to her wound. She's been tolerating the dressing changes without complication and there does not appear to be evidence of active infection which is good news. She is still tolerating the Dakin s Moistened gauze packing without complication. 04/18/19 on evaluation today patient is seen by way of a telehealth visit due to her concerns of being exposed to Covid-19 Virus if she comes into the clinic. Subsequently her son is present to help with the visit  today. They have been performing the dressing changes specifically Dakins soloution at this point in order to help with granulation in the base of the wound. Fortunately there is no evidence of active infection currently. Her son does note that he feels like maybe some bone in the base of the wound centrally that he is somewhat concerned about. With that being said it's not entirely visible but he states that he can palpate it. 05/25/2019 upon evaluation today patient appears to be doing very well in fact in regard to her wounds. I am very pleased with how things seem to be progressing and though she is not showing any signs of infection I do believe that it may be time to switch the dressing to something a little different I think she has a lot of irritation in the periwound and this is likely coming some from the fact that she does have a lot of moisture. I think the wound is small enough we can see about initiation of a silver alginate dressing. I think this will help keep the periwound from breaking down. Overall however I am very pleased with how things stand. 06/15/2019 upon evaluation today patient appears to be doing very well with regard to her wound. Overall the periwound also seems to be doing much better. She does have several filament-like projections of her skin on the periwound region that been present for some time unfortunately they keep getting pulled and stuck to the dressing causing this area to bleed. I think we Jessica [name] getting need to shave/trim these down today to help her to more fully heal in this periwound location so this would not continue to be an issue for her. Objective Constitutional Obese and well-hydrated in no acute distress. Vitals Time Taken: 10:47 AM, Height: 70 in, Temperature: 98.3 F, Pulse: 89 bpm, Respiratory Rate: 16 breaths/min, Blood Pressure: 156/66 mmHg. Respiratory normal breathing without difficulty. Psychiatric this patient is able to make decisions  and demonstrates good insight into disease process. Alert and Oriented x 3. pleasant and cooperative. General Notes: Upon inspection patient's wound itself appears to be doing well however the 2 wounds did have several projections around them where there were skin filaments sticking up that honestly flat back and forth and at times were getting stuck to the dressings and causing this to tear. With that [facility], Jaiah  W. ([id number]) in mind I did discuss with the patient and her son today trimming these off which they both feel is a good idea. We did therefore proceed to do so and both areas had several spots that were trimmed away. Post debridement which include skin and subcutaneous tissue the patient's periwound seem to be doing much better silver nitrate was used in a couple locations to stop bleeding. We will continue with the alginate dressing as I think that is doing well. Integumentary (Hair, Skin) Wound #1 status is Open. Original cause of wound was Pressure Injury. The wound is located on the Medial Sacrum. The wound measures 5.5cm length x 4cm width x 2cm depth; 17.279cm^2 area and 34.558cm^3 volume. There is muscle, Fat Layer (Subcutaneous Tissue) Exposed, and fascia exposed. There is no tunneling noted, however, there is undermining starting at 7:00 and ending at 12:00 with a maximum distance of 2.3cm. There is a medium amount of sanguinous drainage noted. Foul odor after cleansing was noted. The wound margin is distinct with the outline attached to the wound base. There is large (67-100%) red, pink, hyper - granulation within the wound bed. There is a small (1-33%) amount of necrotic tissue within the wound bed including Adherent Slough. Wound #6 status is Open. Original cause of wound was Pressure Injury. The wound is located on the Right Sacrum. The wound measures 1.1cm length x 0.8cm width x 0.3cm depth; 0.691cm^2 area and 0.207cm^3 volume. There is Fat Layer (Subcutaneous Tissue)  Exposed exposed. There is no tunneling or undermining noted. There is a medium amount of serous drainage noted. The wound margin is flat and intact. There is large (67-100%) pink granulation within the wound bed. There is a small (1-33%) amount of necrotic tissue within the wound bed including Adherent Slough. Assessment Active Problems ICD-10 Pressure ulcer of sacral region, stage 4 Spinal stenosis, site unspecified Age-related physical debility Chronic pain syndrome Mixed hyperlipidemia Pressure ulcer of unspecified site, stage 3 Procedures Wound #1 Pre-procedure diagnosis of Wound #1 is a Pressure Ulcer located on the Medial Sacrum . There was a Excisional Skin/Subcutaneous Tissue Debridement with a total area of 0.25 sq cm performed by STONE III, Otha Monical E., PA-C. With the following instrument(s): Forceps, and Scissors to remove Viable and Non-Viable tissue/material. Material removed includes Subcutaneous Tissue and Skin: Dermis and after achieving pain control using Lidocaine. A time out was conducted at 11:21, prior to the start of the procedure. A Minimum amount of bleeding was controlled with Pressure. The procedure was tolerated well. Post Debridement Measurements: 5.5cm length x 4.4cm width x 2cm depth; 38.013cm^3 volume. Post debridement Stage noted as Category/Stage IV. Character of Wound/Ulcer Post Debridement is stable. Post procedure Diagnosis Wound #1: Same as Pre-Procedure Wound #6 Pre-procedure diagnosis of Wound #6 is a Pressure Ulcer located on the Right Sacrum . There was a Excisional Skin/Subcutaneous Tissue Debridement with a total area of 0.25 sq cm performed by STONE III, Taryll Reichenberger E., PA-C. With the following instrument(s): Forceps, and Scissors to remove Viable and Non-Viable tissue/material. Material removed includes Subcutaneous Tissue and Skin: Dermis and after achieving pain control using Lidocaine. A time out was conducted at 11:21, prior to the start of the  procedure. A Minimum amount of bleeding was controlled with Pressure. The procedure was tolerated well. Post Debridement Measurements: 1.1cm length x 0.8cm width x 0.3cm depth; 0.207cm^3 volume. Post debridement Stage noted as Category/Stage III. Character of Wound/Ulcer Post Debridement is stable. Post procedure Diagnosis Wound #6: Same as Pre-Procedure Plan  Wound Cleansing: Jessica [name], Jessica [name] ([id number]) Wound #1 Medial Sacrum: Other: - please cleanse sacral wound with dakins moistened gauze - do not spray dakins on wound Wound #6 Right Sacrum: Other: - please cleanse sacral wound with dakins moistened gauze - do not spray dakins on wound Anesthetic (add to Medication List): Wound #1 Medial Sacrum: Topical Lidocaine 4% cream applied to wound bed prior to debridement (In Clinic Only). Wound #6 Right Sacrum: Topical Lidocaine 4% cream applied to wound bed prior to debridement (In Clinic Only). Skin Barriers/Peri-Wound Care: Wound #1 Medial Sacrum: Skin Prep Wound #6 Right Sacrum: Skin Prep Primary Wound Dressing: Wound #1 Medial Sacrum: Alginate - with dry gauze behind alginate to fill space Wound #6 Right Sacrum: Alginate - with dry gauze behind alginate to fill space Secondary Dressing: Wound #1 Medial Sacrum: ABD pad - secure with tape Wound #6 Right Sacrum: ABD pad - secure with tape Dressing Change Frequency: Wound #1 Medial Sacrum: Change dressing twice daily. Wound #6 Right Sacrum: Change dressing twice daily. Follow-up Appointments: Wound #1 Medial Sacrum: Return Appointment in 2 weeks. Wound #6 Right Sacrum: Return Appointment in 2 weeks. Off-Loading: Wound #1 Medial Sacrum: Turn and reposition every 2 hours Wound #6 Right Sacrum: Turn and reposition every 2 hours Additional Orders / Instructions: Wound #1 Medial Sacrum: Increase protein intake. Wound #6 Right Sacrum: Increase protein intake. Home Health: Wound #1 Medial Sacrum: Continue Home Health  Visits - [facility] Nurse may visit PRN to address patient s wound care needs. FACE TO FACE ENCOUNTER: MEDICARE and MEDICAID PATIENTS: I certify that this patient is under my care and that I had a face-to-face encounter that meets the physician face-to-face encounter requirements with this patient on this date. The encounter with the patient was in whole or in part for the following MEDICAL CONDITION: (primary reason for Home Healthcare) MEDICAL NECESSITY: I certify, that based on my findings, NURSING services Jessica [name] a medically necessary home health service. HOME BOUND STATUS: I certify that my clinical findings support that this patient is homebound (i.e., Due to illness or injury, pt requires aid of supportive devices such as crutches, cane, wheelchairs, walkers, the use of special transportation or the assistance of another person to leave their place of residence. There is a normal inability to leave the home and doing so requires considerable and taxing effort. Other absences Jessica [name] for medical reasons / religious services and Jessica [name] infrequent or of short duration when for other reasons). If current dressing causes regression in wound condition, may D/C ordered dressing product/s and apply Normal Saline Moist Dressing daily until next Wound Healing Center / Other MD appointment. Notify Wound Healing Center of regression in wound condition at [phone]. Please direct any NON-WOUND related issues/requests for orders to patient's Primary Care Physician Wound #6 Right Sacrum: Continue Home Health Visits - [facility] Nurse may visit PRN to address patient s wound care needs. FACE TO FACE ENCOUNTER: MEDICARE and MEDICAID PATIENTS: I certify that this patient is under my care and that I had a face-to-face encounter that meets the physician face-to-face encounter requirements with this patient on this date. The encounter with the patient was in whole or in part for the following  MEDICAL CONDITION: (primary reason for Home Healthcare) MEDICAL NECESSITY: I certify, that based on my findings, NURSING services Jessica [name] a medically necessary home health service. HOME BOUND STATUS: I certify that my clinical findings support that this patient is homebound (i.e., Due to illness or injury, pt requires  aid of supportive devices such as crutches, cane, wheelchairs, walkers, the use of special transportation or the assistance of another person to leave their place of residence. There is a normal inability to leave the home and doing so requires considerable and taxing effort. Other absences Jessica [name] for medical reasons / religious services and Jessica [name] infrequent or of short duration when for other reasons). If current dressing causes regression in wound condition, may D/C ordered dressing product/s and apply Normal Saline Moist Dressing daily until next [facility], [facility]RER ([id number]) Wound Healing Center / Other MD appointment. Notify Wound Healing Center of regression in wound condition at [phone]. Please direct any NON-WOUND related issues/requests for orders to patient's Primary Care Physician 1. We will continue with the silver alginate dressing at this point I still think that is the best option for the patient. 2. I am also can recommend that we continue with appropriate offloading they have always done a great job with this and I think that is something to continue keeping a close eye on. 3. I am also can recommend that ABD pad secured with tape be used on external that seems to have done well for her as well and overall I think that still an appropriate direction to take. We will see patient back for reevaluation in 2 weeks here in the clinic. If anything worsens or changes patient will contact our office for additional recommendations. Electronic Signature(s) Signed: 06/15/2019 5:36:35 PM By: Lenda Kelp PA-C Entered By: Lenda Kelp on 06/15/2019 17:36:35 Jessica [name]  ([id number]) -------------------------------------------------------------------------------- SuperBill Details [name]. Date of Service: 06/15/2019 Medical Record Number: [id number] Patient Account Number: [id number] Date of Birth/Sex: [birth year] (74 y.o. F) Treating RN: Rodell Perna Primary Care Provider: Annita Brod Other Clinician: Referring Provider: Annita Brod Treating Provider/Extender: STONE III, Cassandra Harbold Weeks in Treatment: 107 Diagnosis Coding ICD-10 Codes Code Description L89.154 Pressure ulcer of sacral region, stage 4 M48.00 Spinal stenosis, site unspecified R54 Age-related physical debility G89.4 Chronic pain syndrome E78.2 Mixed hyperlipidemia L89.93 Pressure ulcer of unspecified site, stage 3 Facility Procedures CPT4 Code: 29528413 Description: 11042 - DEB SUBQ TISSUE 20 SQ CM/< Modifier: Quantity: 1 CPT4 Code: Description: ICD-10 Diagnosis Description L89.154 Pressure ulcer of sacral region, stage 4 Modifier: Quantity: Physician Procedures CPT4 Code: 2440102 Description: 11042 - WC PHYS SUBQ TISS 20 SQ CM Modifier: Quantity: 1 CPT4 Code: Description: ICD-10 Diagnosis Description L89.154 Pressure ulcer of sacral region, stage 4 Modifier: Quantity: Electronic Signature(s) Signed: 06/15/2019 5:37:05 PM By: Lenda Kelp PA-C Entered By: Lenda Kelp on 06/15/2019 17:36:55

## 2019-06-29 ENCOUNTER — Ambulatory Visit: Payer: Medicare Other | Admitting: Physician Assistant

## 2019-07-06 ENCOUNTER — Other Ambulatory Visit: Payer: Self-pay

## 2019-07-06 ENCOUNTER — Encounter: Payer: Medicare Other | Admitting: Physician Assistant

## 2019-07-06 DIAGNOSIS — L89154 Pressure ulcer of sacral region, stage 4: Secondary | ICD-10-CM | POA: Diagnosis not present

## 2019-07-06 NOTE — Progress Notes (Signed)
Jessica [name], Jessica [name] ([id number]) Visit Report for 07/06/2019 Arrival Information Details [name], Jessica [name]. Date of Service: 07/06/2019 11:00 AM Medical Record Number: [id number] Patient Account Number: [id number] Date of Birth/Sex: 1945/12/12 (74 y.o. F) Treating RN: Curtis Sites Primary Care Tashiya Souders: Annita Brod Other Clinician: Referring Endya Austin: Annita Brod Treating Kieran Nachtigal/Extender: STONE III, HOYT Weeks in Treatment: 110 Visit Information History Since Last Visit Added or deleted any medications: No Patient Arrived: Stretcher Any new allergies or adverse reactions: No Arrival Time: 10:55 Had a fall or experienced change in No Accompanied By: son activities of daily living that may affect Transfer Assistance: Manual risk of falls: Patient Identification Verified: Yes Signs or symptoms of abuse/neglect since last visito No Secondary Verification Process Completed: Yes Hospitalized since last visit: No Patient Has Alerts: Yes Implantable device outside of the clinic excluding No Patient Alerts: ALLERGIC TO ZINC cellular tissue based products placed in the center since last visit: Has Dressing in Place as Prescribed: Yes Pain Present Now: No Electronic Signature(s) Signed: 07/06/2019 4:59:02 PM By: Curtis Sites Entered By: Curtis Sites on 07/06/2019 10:56:51 Jessica [name] ([id number]) -------------------------------------------------------------------------------- Clinic Level of Care Assessment Details [name]. Date of Service: 07/06/2019 11:00 AM Medical Record Number: [id number] Patient Account Number: [id number] Date of Birth/Sex: [birth year] (74 y.o. F) Treating RN: Rodell Perna Primary Care Koichi Platte: Annita Brod Other Clinician: Referring Denetta Fei: Annita Brod Treating Lagina Reader/Extender: STONE III, HOYT Weeks in Treatment: 110 Clinic Level of Care Assessment Items TOOL 4 Quantity Score []  - Use when only  an EandM is performed on FOLLOW-UP visit 0 ASSESSMENTS - Nursing Assessment / Reassessment X - Reassessment of Co-morbidities (includes updates in patient status) 1 10 X- 1 5 Reassessment of Adherence to Treatment Plan ASSESSMENTS - Wound and Skin Assessment / Reassessment X - Simple Wound Assessment / Reassessment - one wound 1 5 []  - 0 Complex Wound Assessment / Reassessment - multiple wounds []  - 0 Dermatologic / Skin Assessment (not related to wound area) ASSESSMENTS - Focused Assessment []  - Circumferential Edema Measurements - multi extremities 0 []  - 0 Nutritional Assessment / Counseling / Intervention []  - 0 Lower Extremity Assessment (monofilament, tuning fork, pulses) []  - 0 Peripheral Arterial Disease Assessment (using hand held doppler) ASSESSMENTS - Ostomy and/or Continence Assessment and Care []  - Incontinence Assessment and Management 0 []  - 0 Ostomy Care Assessment and Management (repouching, etc.) PROCESS - Coordination of Care X - Simple Patient / Family Education for ongoing care 1 15 []  - 0 Complex (extensive) Patient / Family Education for ongoing care X- 1 10 Staff obtains , Records, Test Results / Process Orders []  - 0 Staff telephones HHA, Nursing Homes / Clarify orders / etc []  - 0 Routine Transfer to another Facility (non-emergent condition) []  - 0 Routine Hospital Admission (non-emergent condition) []  - 0 New Admissions / / Ordering NPWT, Apligraf, etc. []  - 0 Emergency Hospital Admission (emergent condition) X- 1 10 Simple Discharge Coordination []  - 0 Complex (extensive) Discharge Coordination PROCESS - Special Needs []  - Pediatric / Minor Patient Management 0 []  - 0 Isolation Patient Management []  - 0 Hearing / Language / Visual special needs []  - 0 Assessment of Community assistance (transportation, D/C planning, etc.) Jessica [name], Jessica [name]. ( ) []  - 0 Additional assistance / Altered  mentation []  - 0 Support Surface(s) Assessment (bed, cushion, seat, etc.) INTERVENTIONS - Wound Cleansing / Measurement X - Simple Wound Cleansing - one wound 1 5 []  - 0 Complex Wound  Cleansing - multiple wounds X- 1 5 Wound Imaging (photographs - any number of wounds) []  - 0 Wound Tracing (instead of photographs) X- 1 5 Simple Wound Measurement - one wound []  - 0 Complex Wound Measurement - multiple wounds INTERVENTIONS - Wound Dressings []  - Small Wound Dressing one or multiple wounds 0 X- 1 15 Medium Wound Dressing one or multiple wounds []  - 0 Large Wound Dressing one or multiple wounds []  - 0 Application of Medications - topical []  - 0 Application of Medications - injection INTERVENTIONS - Miscellaneous []  - External ear exam 0 []  - 0 Specimen Collection (cultures, biopsies, blood, body fluids, etc.) []  - 0 Specimen(s) / Culture(s) sent or taken to Lab for analysis []  - 0 Patient Transfer (multiple staff / / Similar devices) []  - 0 Simple Staple / Suture removal (25 or less) []  - 0 Complex Staple / Suture removal (26 or more) []  - 0 Hypo / Hyperglycemic Management (close monitor of Blood Glucose) []  - 0 Ankle / Brachial Index (ABI) - do not check if billed separately X- 1 5 Vital Signs Has the patient been seen at the hospital within the last three years: Yes Total Score: 90 Level Of Care: New/Established - Level 3 Electronic Signature(s) Signed: 07/06/2019 3:40:54 PM By: Entered By: on 07/06/2019 11:49:29 ( ) -------------------------------------------------------------------------------- Encounter Discharge Information Details Patient Name: . Date of Service: 07/06/2019 11:00 AM Medical Record Number: Patient Account Number: Nurse, adult Date of Birth/Sex: 1945-11-12 (74 y.o. F) Treating RN: Primary Care Maurita Havener: Other  Clinician: Referring Parthena Fergeson: 07/08/2019 Treating Vasilios Ottaway/Extender: Rodell Perna, HOYT Weeks in Treatment: 110 Encounter Discharge Information Items Discharge Condition: Stable Ambulatory Status: Stretcher Discharge Destination: Home Transportation: Ambulance Accompanied By: son Schedule Follow-up Appointment: Yes Clinical Summary of Care: Electronic Signature(s) Signed: 07/06/2019 3:40:54 PM By: 07/08/2019 Entered By: Jessica [name] on 07/06/2019 11:50:50 Jessica [name] (07/08/2019) -------------------------------------------------------------------------------- Lower Extremity Assessment Details Patient Name: [id number]. Date of Service: 07/06/2019 11:00 AM Medical Record Number: 06/10/1945 Patient Account Number: [id number] Date of Birth/Sex: 1945-09-26 (74 y.o. F) Treating RN: Annita Brod Primary Care Elliotte Marsalis: Linwood Dibbles Other Clinician: Referring Ilma Achee: 07/08/2019 Treating Va Broadwell/Extender: Rodell Perna, HOYT Weeks in Treatment: 110 Electronic Signature(s) Signed: 07/06/2019 4:59:02 PM By: 07/08/2019 Entered By: Jessica [name] on 07/06/2019 10:57:59 Jessica [name] (07/08/2019) -------------------------------------------------------------------------------- Multi Wound Chart Details Patient Name: [id number]. Date of Service: 07/06/2019 11:00 AM Medical Record Number: 06/10/1945 Patient Account Number: [id number] Date of Birth/Sex: Jan 17, 1946 (74 y.o. F) Treating RN: Annita Brod Primary Care Saylee Sherrill: Linwood Dibbles Other Clinician: Referring Ajayla Iglesias: 07/08/2019 Treating Dotty Gonzalo/Extender: STONE III, HOYT Weeks in Treatment: 110 Vital Signs Height(in): 70 Pulse(bpm): 82 Weight(lbs): Blood Pressure(mmHg): 136/60 Body Mass Index(BMI): Temperature(F): 98.5 Respiratory Rate(breaths/min): 16 Photos: [N/A:N/A] Wound Location: Medial Sacrum Right Sacrum N/A Wounding Event: Pressure Injury Pressure Injury N/A Primary  Etiology: Pressure Ulcer Pressure Ulcer N/A Date Acquired: 01/15/2017 05/28/2019 N/A Weeks of Treatment: 110 3 N/A Wound Status: Open Healed - Epithelialized N/A Measurements L x W x D (cm) 5x3.5x2 0x0x0 N/A Area (cm) : 13.744 0 N/A Volume (cm) : 27.489 0 N/A % Reduction in Area: 62.70% 100.00% N/A % Reduction in Volume: 25.40% 100.00% N/A Classification: Category/Stage IV Category/Stage III N/A Exudate Amount: Medium None Present N/A Exudate Type: Sanguinous N/A N/A Exudate Color: red N/A N/A Foul Odor After Cleansing: Yes No N/A Odor Anticipated Due to Product No N/A N/A  Use: Wound Margin: Distinct, outline attached Flat and Intact N/A Granulation Amount: Large (67-100%) None Present (0%) N/A Granulation Quality: Red, Pink N/A N/A Necrotic Amount: Small (1-33%) None Present (0%) N/A Exposed Structures: Fat Layer (Subcutaneous Tissue) Fascia: No N/A Exposed: Yes Fat Layer (Subcutaneous Tissue) Fascia: No Exposed: No Tendon: No Tendon: No Muscle: No Muscle: No Joint: No Joint: No Bone: No Bone: No Limited to Skin Breakdown Epithelialization: Small (1-33%) Large (67-100%) N/A Treatment Notes Electronic Signature(s) Signed: 07/06/2019 3:40:54 PM By: Eugenia Pancoast ([id number]) Entered By: Rodell Perna on 07/06/2019 11:47:28 Jessica [name] ([id number]) -------------------------------------------------------------------------------- Multi-Disciplinary Care Plan Details [name]. Date of Service: 07/06/2019 11:00 AM Medical Record Number: [id number] Patient Account Number: [id number] Date of Birth/Sex: 05-04-1945 (74 y.o. F) Treating RN: Rodell Perna Primary Care Netanya Yazdani: Annita Brod Other Clinician: Referring Jakyla Reza: Annita Brod Treating Anetra Czerwinski/Extender: STONE III, HOYT Weeks in Treatment: 110 Active Inactive Pressure Nursing Diagnoses: Knowledge deficit related to causes and risk factors for pressure ulcer  development Knowledge deficit related to management of pressures ulcers Potential for impaired tissue integrity related to pressure, friction, moisture, and shear Goals: Patient will remain free from development of additional pressure ulcers Date Initiated: 06/03/2017 Target Resolution Date: 06/17/2017 Goal Status: Active Patient will remain free of pressure ulcers Date Initiated: 06/03/2017 Target Resolution Date: 06/17/2017 Goal Status: Active Patient/caregiver will verbalize risk factors for pressure ulcer development Date Initiated: 06/03/2017 Target Resolution Date: 06/17/2017 Goal Status: Active Interventions: Assess: immobility, friction, shearing, incontinence upon admission and as needed Assess potential for pressure ulcer upon admission and as needed Notes: Wound/Skin Impairment Nursing Diagnoses: Impaired tissue integrity Goals: Patient/caregiver will verbalize understanding of skin care regimen Date Initiated: 06/03/2017 Target Resolution Date: 06/17/2017 Goal Status: Active Ulcer/skin breakdown will have a volume reduction of 30% by week 4 Date Initiated: 06/03/2017 Target Resolution Date: 06/17/2017 Goal Status: Active Interventions: Assess ulceration(s) every visit Treatment Activities: Patient referred to home care : 06/03/2017 Skin care regimen initiated : 06/03/2017 Notes: Electronic Signature(s) Signed: 07/06/2019 3:40:54 PM By: Eugenia Pancoast ([id number]) Entered By: Rodell Perna on 07/06/2019 11:47:20 Jessica [name] ([id number]) -------------------------------------------------------------------------------- Pain Assessment Details [name]. Date of Service: 07/06/2019 11:00 AM Medical Record Number: [id number] Patient Account Number: [id number] Date of Birth/Sex: 08-May-1945 (74 y.o. F) Treating RN: Curtis Sites Primary Care Demetrion Wesby: Annita Brod Other Clinician: Referring Dedee Liss: Annita Brod Treating  Kaylie Ritter/Extender: STONE III, HOYT Weeks in Treatment: 110 Active Problems Location of Pain Severity and Description of Pain Patient Has Paino No Site Locations Pain Management and Medication Current Pain Management: Electronic Signature(s) Signed: 07/06/2019 4:59:02 PM By: Curtis Sites Entered By: Curtis Sites on 07/06/2019 10:57:50 Jessica [name] ([id number]) -------------------------------------------------------------------------------- Patient/Caregiver Education Details [name]. Date of Service: 07/06/2019 11:00 AM Medical Record Number: [id number] Patient Account Number: [id number] Date of Birth/Gender: [birth year] (74 y.o. F) Treating RN: Rodell Perna Primary Care Physician: Annita Brod Other Clinician: Referring Physician: Annita Brod Treating Physician/Extender: Linwood Dibbles, HOYT Weeks in Treatment: 110 Education Assessment Education Provided To: Patient Education Topics Provided Wound/Skin Impairment: Handouts: Caring for Your Ulcer Methods: Demonstration, Explain/Verbal Responses: State content correctly Electronic Signature(s) Signed: 07/06/2019 3:40:54 PM By: Rodell Perna Entered By: Rodell Perna on 07/06/2019 11:49:51 Jessica [name] ([id number]) -------------------------------------------------------------------------------- Wound Assessment Details [name]. Date of Service: 07/06/2019 11:00 AM Medical Record Number: [id number] Patient Account Number: [id number] Date of Birth/Sex: [birth year] (74 y.o. F) Treating RN: Curtis Sites Primary Care Bessie Livingood:  ASENSO, PHILIP Other Clinician: Referring Twylla Arceneaux: Clovia Cuff Treating Silus Lanzo/Extender: STONE III, HOYT Weeks in Treatment: 110 Wound Status Wound Number: 1 Primary Etiology: Pressure Ulcer Wound Location: Medial Sacrum Wound Status: Open Wounding Event: Pressure Injury Date Acquired: 01/15/2017 Weeks Of Treatment: 110 Clustered Wound:  No Photos Wound Measurements Length: (cm) 5 % Red Width: (cm) 3.5 % Red Depth: (cm) 2 Epith Area: (cm) 13.744 Tunn Volume: (cm) 27.489 Unde uction in Area: 62.7% uction in Volume: 25.4% elialization: Small (1-33%) eling: No rmining: No Wound Description Classification: Category/Stage IV Foul Wound Margin: Distinct, outline attached Due t Exudate Amount: Medium Sloug Exudate Type: Sanguinous Exudate Color: red Odor After Cleansing: Yes o Product Use: No h/Fibrino Yes Wound Bed Granulation Amount: Large (67-100%) Exposed Structure Granulation Quality: Red, Pink Fascia Exposed: No Necrotic Amount: Small (1-33%) Fat Layer (Subcutaneous Tissue) Exposed: Yes Necrotic Quality: Adherent Slough Tendon Exposed: No Muscle Exposed: No Joint Exposed: No Bone Exposed: No Treatment Notes Wound #1 (Medial Sacrum) Notes S. cell, ABD tape Electronic Signature(s) Signed: 07/06/2019 4:59:02 PM By: Peggye Pitt ([id number]) Entered By: Montey Hora on 07/06/2019 11:06:15 Jessica [name] ([id number]) -------------------------------------------------------------------------------- Wound Assessment Details [name]. Date of Service: 07/06/2019 11:00 AM Medical Record Number: [id number] Patient Account Number: [id number] Date of Birth/Sex: 11-23-1945 (74 y.o. F) Treating RN: Montey Hora Primary Care Takeia Ciaravino: Clovia Cuff Other Clinician: Referring Mahogony Gilchrest: Clovia Cuff Treating Noely Kuhnle/Extender: STONE III, HOYT Weeks in Treatment: 110 Wound Status Wound Number: 6 Primary Etiology: Pressure Ulcer Wound Location: Right Sacrum Wound Status: Healed - Epithelialized Wounding Event: Pressure Injury Date Acquired: 05/28/2019 Weeks Of Treatment: 3 Clustered Wound: No Photos Wound Measurements Length: (cm) 0 Width: (cm) 0 Depth: (cm) 0 Area: (cm) Volume: (cm) % Reduction in Area: 100% % Reduction in Volume:  100% Epithelialization: Large (67-100%) 0 Tunneling: No 0 Undermining: No Wound Description Classification: Category/Stage III Wound Margin: Flat and Intact Exudate Amount: None Present Foul Odor After Cleansing: No Slough/Fibrino No Wound Bed Granulation Amount: None Present (0%) Exposed Structure Necrotic Amount: None Present (0%) Fascia Exposed: No Fat Layer (Subcutaneous Tissue) Exposed: No Tendon Exposed: No Muscle Exposed: No Joint Exposed: No Bone Exposed: No Limited to Skin Breakdown Electronic Signature(s) Signed: 07/06/2019 4:59:02 PM By: Montey Hora Entered By: Montey Hora on 07/06/2019 11:06:20 Jessica [name] ([id number]) -------------------------------------------------------------------------------- Vitals Details [name]. Date of Service: 07/06/2019 11:00 AM Medical Record Number: [id number] Patient Account Number: [id number] Date of Birth/Sex: July 07, 1945 (74 y.o. F) Treating RN: Montey Hora Primary Care Rigby Swamy: Clovia Cuff Other Clinician: Referring Shanan Fitzpatrick: Clovia Cuff Treating Valecia Beske/Extender: STONE III, HOYT Weeks in Treatment: 110 Vital Signs Time Taken: 10:56 Temperature (F): 98.5 Height (in): 70 Pulse (bpm): 82 Respiratory Rate (breaths/min): 16 Blood Pressure (mmHg): 136/60 Reference Range: 80 - 120 mg / dl Electronic Signature(s) Signed: 07/06/2019 4:59:02 PM By: Montey Hora Entered By: Montey Hora on 07/06/2019 10:57:14

## 2019-07-06 NOTE — Progress Notes (Addendum)
AAMANI, MOOSE ([id number]) Visit Report for 07/06/2019 Chief Complaint Document Details [name], Jessica [name]. Date of Service: 07/06/2019 11:00 AM Medical Record Number: [id number] Patient Account Number: [id number] Date of Birth/Sex: Sep 03, 1945 (74 y.o. F) Treating RN: Primary Care Provider: Annita Brod Other Clinician: Referring Provider: Annita Brod Treating Provider/Extender: STONE III, Tanaisha Pittman Weeks in Treatment: 110 Information Obtained from: Patient Chief Complaint she is here for evaluation of a sacral ulcer Electronic Signature(s) Signed: 07/06/2019 11:35:07 AM By: Lenda Kelp PA-C Entered By: Lenda Kelp on 07/06/2019 11:35:06 Jessica [name] ([id number]) -------------------------------------------------------------------------------- HPI Details [name]. Date of Service: 07/06/2019 11:00 AM Medical Record Number: [id number] Patient Account Number: [id number] Date of Birth/Sex: February 08, 1946 (74 y.o. F) Treating RN: Primary Care Provider: Annita Brod Other Clinician: Referring Provider: Annita Brod Treating Provider/Extender: Linwood Dibbles, Merrissa Giacobbe Weeks in Treatment: 110 History of Present Illness HPI Description: 05/27/17-she is here in initial evaluation for a left-sided sacral stage IV pressure ulcer and bilateral lower extremity, lateral aspect, unstageable pressure ulcers. She is accompanied by her husband and her son, who are her primary caregivers. She is bedbound secondary to spinal stenosis. According to her son and husband she was hospitalized from 10/5-11/1 with healthcare associated pneumonia and altered mental status. During her hospitalization she was intubated, extubated on 10/27. She was discharged with Foley catheter and follow up with urology. According to her son and spouse she developed the sacral ulcer during hospitalization. Home health has been applying Santyl daily. She does have a low air loss mattress  and is repositioned every 2-3 hours per her family's report. According to the son and spouse she had an appointment with urology on 12/18 and during that appointment developed discoloration to her bilateral lower extremities which ultimately developed into unstageable pressure ulcers to the lateral aspects of her bilateral lower extremities. There has been no topical treatment applied to these. She continues to have home health. There is no concerns expressed regarding dietary intake, stating she eats 3 meals a day, eating was provided; she is supplemented with boost with protein. 06/03/17-she is here in follow-up evaluation for sacral and bilateral lower extremity ulcers. Plain film x-ray done today reveals no distraction to the sacrum or coccyx, no visible abnormalities. Home health has ordered the negative pressure wound system but it has not been initiated. We will continue with Hydrofera Blue until initiation of negative pressure wound system and continue with Santyl to bilateral lower extremity ulcers. Follow-up next week 06/10/17-she is here in follow-up evaluation for sacral and bilateral lower extremity ulcers. The wound VAC will be available tomorrow per home health. We will initiate wound VAC therapy to the sacral ulcer 3 times weekly (Thursday, Saturday/Sunday, Tuesday). We will continue with Santyl to the lower extremity ulcers. The patient's son is checking into home health therapy over the weekend for [facility] changes, with the understanding that if VAC changes cannot be performed over the weekend he will need to change his mother's appointments to Monday, Wednesday or Friday. The sacral ulcer clinically does not appear infected but there has been a change in the amount of drainage acutely, there is no significant amount of devitalized tissue, there is no malodor. Wound culture was obtained to evaluate for occult infection we will hold off on antibiotic therapy until sensitivities are  resulted. 06/18/17 on evaluation today patient appears to be doing fairly well in my opinion although this is the Jessica time I have seen this patient she has been previously evaluated  by Tacey Ruiz here in our office. She is going to be switching to Fridays to see me due to the Wound VAC schedule being changed on Monday, Wednesday, and Friday. Subsequently she seems to be doing fairly well with the Wound VAC. Her son who was present during evaluation today states that he somewhat stresses of the Wound VAC in making sure that it was functioning appropriately. With that being said everything seems to be working well he knows what settings on the [facility] itself to look at and ensure that it is functioning properly. Overall the wound appears to be nice and clean there is no need for debridement today. She has no discomfort in her bilateral lower extremity ulcers also appear to be improving based on measurements and what this honest tell me about the overall appearance. 07/02/17 on evaluation today I noted in patients wound bed that was actually an odor that had not previously been noted. Subsequently there was also a small area of bone that was not previously noted during my last evaluation. This did appear to be necrotic and was being somewhat forced out by the body around the region of granulation. She does not have any pain which is good news. Nonetheless the overall appearance of the ulcer is making me concerned for the patient having developed osteomyelitis. Currently she is not been on any antibiotics and the Wound VAC has been doing fairly well in general. With that being said I do not think we need to continue the Wound VAC if she potentially has a bone infection. At least not until it is properly addressed with antibiotics. 07/09/17 on evaluation today patient appears to actually be doing very well in regard to her sacral ulcer compared to last week. There is actually no exposed bone at this point. Her  pathology report showed early signs of osteomyelitis which had explained to the patient son is definitely good news catching this early is often a key to getting it better without things worsening. With that being said still I do believe she needs to have a referral to infectious disease due to the osteomyelitis I am gonna recommend that she continue with the doxycycline based on the culture results which showed Eikenella Corrodens as the organism identified that tetracycline should work for. She does not seem to be having any discomfort whatsoever at this point. 07/16/17 on evaluation today patient actually appears to be doing rather well in regard to her sacral wound. I think that the original wound site actually appears much better than previously noted. With that being said she does have a new superficial injury on the right sacral region which appears to be due to according to the sign transport that occurred for her MRI unfortunately. Fortunately this is not too deep and I do think it can be managed but it was a new area that was not previously noted. Otherwise she has been tolerating the Dakinos soaked gauze packing without complication. 07/30/17 on evaluation today patient presents for reevaluation concerning her sacral wound. She has been tolerating the dressing changes without complication. With that being said her wound is doing so well at this point that I think she may be at the point where we could reinitiate the Wound VAC currently and hopefully see good results from this. I'm very pleased with how she has responded to the Dakinos soaked gauze packing. 08/13/17 evaluation today patient appears to be doing excellent in regard to her lower extremity ulcer this seems to be cleaning up very  nicely. In regard to the sacral ulcer the area of trauma on the right lateral portion of the wound actually appears to be much better than previously noted during the last evaluation. The word has definitely  filled in and the Wound VAC appears to be helpful I do believe. Overall I'm very pleased with how things seem to be progressing at this time. Patient likewise is also happy that things are doing well. Jessica [name], Jessica [name] ([id number]) 08/27/17 on evaluation today patient presents for follow-up concerning her ongoing issues with her sacral ulcer and lower extremity ulcer. Fortunately she has been tolerating the dressing changes well complication the Wound VAC in general seems to have done very well up to this point. She does not have any evidence of infection which is good news. She does have some dark discoloration in central portion of the wound which is troublesome for the possibility of pressure injury to the site it sounds like her prior air mattress was not functioning properly her son has just bought a new one for her this is doing much better. Otherwise things seem to be progressing nicely. 09/10/17 on evaluation today patient presents for follow-up concerning her sacral wound and lower extremity ulcer. She has been tolerating the switch of her dressing changes to the Dakinos soaked gauze dressing very well in regard to the sacrum. The left lateral lower extremity ulcer appears to have a new area open just distal to the one that we have been treating with Santyl and this is new since her last visit. There does not appear to be any evidence of infection which is good news. With that being said there's a lot of maceration here at the site and I feel this is likely due to the switch and the dressings it appears that due to the sticking of the dressings it will switch to utilizing a telfa pad over the Santyl. I think this caused a lot of drainage to be collected and situated in the region just under the dressing which has led to this causing which duration of breakdown. Overall there does not appear to be any significant pain which is good news. Of note I did actually have a conversation with the  radiologist who is an interventional radiologist with [facility]. 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 With that being said there does not appear to be any rush to get this done according to the interventional radiologist whom I spoke with. Therefore after discussion it was determined that there gonna wait about six months prior to considering the procedure to get time for hopefully the sacral wound to heal in the interim to at least some degree. 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 According to nursing staff these are all improved, the negative pressure wound therapy system was put on hold last week. We will resume negative pressure wound therapy today, continue with Santyl to the lower extremity and she will follow-up next week. 09/24/17 on evaluation today unfortunately the patient's wound appears to be doing significantly worse compared to last week's evaluation and even my valuation the week before. She actually has bone noted on the left wound margin in the region of undermining unfortunately. This was not noted during the last evaluation. I am concerned about the fact that this seems to be worse not better since our last visit with her. My biggest concern is  that she's likely developing a worsening osteomyelitis. This was discussed with patient and her son today during the office visit. 05/03/17 on evaluation today patient actually appears to be doing rather well in regard to her sacral ulcer compared to last week's evaluation. She has been tolerating the dressing changes without complication. Fortunately even though we're not utilizing the Wound VAC she seems to be making some strides in seeing the wound area overall improved quite dramatically in my pinion in just one weeks time. She also seems to be staying off of this to the point that I do not see any  evidence of new injury which is excellent news. Whatever she's been doing in that regard over the past week I would want her to continue. I did also review the results of her bone culture which revealed group B strep as the responsible organism. Again this is what's causing her osteomyelitis she does have infectious disease appointment scheduled for 10/11/17 10/08/1898 valuation today patient appears to be doing better for the most part in regard to the sacral wound. Overall she is showing signs of having granulation of the bone which is good news. There is an area towards the left of the wound where she does seem to be showing some signs of opening this it would be due to additional pressure to the area unfortunately. There does not appear to be any evidence of infection spreading which is good news that do not see any evidence of significant purulent discharge which is also good news. 10/26/17 on evaluation today patient sacral ulcer actually appears to be very healthy and doing well in regard to the overall appearance of the wound. With that being said she does seem to be having some issues at this point in time with some shear/friction injury of the sacrum from where she was transported to [facility] for her infectious disease appointment. She has been placed on IV antibiotic therapy I will have to go back and find that note for review as I did not have access to that today. Nonetheless I will add the next visit be sure to document the exact antibiotics that she is taking at this point. Nonetheless I do believe the wound is doing better there's no bone exposure nothing that requires debridement in regard to the sacral wound. Likewise her left lateral lower extremity ulcer also appears to be doing significantly better. In general I feel like things are showing signs of improvement all around. 11/12/17 on evaluation today patient appears to be doing rather well in regard to her sacral wound. In general I  do see signs of granulation at this point. Fortunately there does not appear to be any evidence of infection which is good news as well. In general overall happy with the appearance. With that being said I'll be see I would like for this to be progressing more rapidly as with the patient and her son but nonetheless at least things do not appear to be doing worse. Her lower extremity ulcer is doing significantly better. 11/26/17 on evaluation today patient appears to be doing a little better in regard to the sacral wound especially in the peripheral of the wound bed. She actually did have an abrasion on the right gluteal region that's completely resolved to this point. Her lower extremity also appears to be for the most part completely resolved. Overall the sacrum itself seems to be the one thing that is still open and given her trouble. No fevers, chills, nausea, or vomiting noted  at this time. She actually has an appointment next week with infectious disease for recheck to see how things are doing. She maybe switch to oral medications at that point. 12/09/17 on evaluation today patient actually appears to be doing much better in regard to her sacral wound. I do feel like she has made good progress at this time as far as healing is concerned. In fact the wound looks better to me today than it has in quite some time. She is on oral antibiotic therapy and no longer on the IV antibiotics. She did see infectious disease last week. 12/24/17 on evaluation today patient's wounds actually appear to be doing decently well in regard to the sacral wound in particular. When it comes to her lower extremity ulcers both appear to be completely healed at this point in the one area where she had lived the rash has completely resolved at this time. Overall very pleased with the progress that has been made. Nonetheless the patient seems to be doing well in general. She still does have quite a bit of healing to do in regard  to the sacral wound but I feel like she is making progress. 01/07/18 on evaluation today patient actually appears to be doing excellent today regard to her sacral wound. The granular quality in the base of the wound is excellent and shown signs of good improvement there's no evidence of contusion nor deep tissue injury and the periwound actually appears to be doing well. In general I'm very pleased with the overall appearance. 02/04/18 on evaluation today patient's wound actually appears to be doing decently well as far as the granulation is concerned. She has been tolerating the dressing changes without complication. Right now we are still using the Wound VAC. Nonetheless overall there apparently is some cater HAVA, [place]. ([id number]) to the wound and this is concerning simply for the fact that she could be developing a soft tissue infection again. She's had this happen in the past and at that time responded very well to the antibiotics. Nonetheless I don't think I would likely put her on anything prophylactically but rather wait for the results of the culture to return. 02/18/18 on evaluation today patient actually appears to be doing fairly well. She has been tolerating the dressing changes. And the Wound VAC seems to be doing well. Overall I'm very pleased with how things have improved over the past couple weeks since I last saw her. Fortunately there does not appear to be any evidence of overt infection at this time which is good news. She has been taking the antibiotics without complication. Specifically that is the Bactrim DS which was prescribed on 02/08/18. 03/18/18 on evaluation today patient's wound actually does have quite a bit of odor noted compared to previous. With that being said the wound itself overall does not appear to be too significant or worse. There is no event noted in the wound bed which is good news. With that being said the patient has been worried as the home health  nurse stated that she thought she saw bone in the base of the wound. Nonetheless there's a little bit of bruising along the left lateral border of the wound bed which again does not appear to be terrible we have noticed this before but other than this the majority the wound bed appears to be doing excellent in my pinion. I am wondering if there may be some bacterial colonization. This could be leading to some of the odor that  we are noting with the Wound VAC. 04/01/18 on evaluation today patient appears to actually be doing well in regard to the overall appearance of the wound itself. She also does not have any odor at this point in regard to the wound surface and I do believe the Bacons is extremely helpful in this regard. With that being said she does have a little bit of deep tissue injury on the medial aspect of the wound. Again last time it was more lateral. The lateral seems to have improved quite well. Nonetheless she also notes by way of her son that this has been very macerated in the past several weeks since I last saw her. Though things overall seem to be doing better I still think that the Wound VAC may help with managing her fluid better I'm wondering if we can attempt a gauze Wound VAC my hope is that this will be able to manage the moisture control as well as continuing with managing the odor of the wound and bacterial load since the Dakinos has done excellent in helping in that regard. 04/15/17 on evaluation today patient actually appears to be doing much better in regard to the overall appearance of her sacral wound. I do believe that the gauze Vac has been of benefit for her. Overall she is tolerating this without complication which is also good news. There is no sign of injury to the sacral region at this point. 04/29/18 on evaluation today patient appears to be doing well in regard to her sacral ulcer. She sure signs of good granulation and the Gauze VAC is doing very well for her.  Overall I'm pleased in this regard. The biggest issue is that the time from Friday till Monday seems to be quite long for her as far as the amount of time to keep the Wound VAC sealed. For that reason her son wonders if he can take this off on Sunday morning in order to give her day of just wet to dry dressing's with the Dakin's solution I think this would be okay. 05/13/18 on evaluation today patient appears to be doing okay in regard to her sacral ulcer. There does not appear to be any evidence of overt infection at this time which is good news. In general the drainage and moisture appears to be significantly improved this was after the pressure on the Wound VAC was increased after they contacted our office. With that being said a lot of the dark tissue around the edge of the wound has improved in the maceration is significantly better this seems to be managing much better. Fortunately there is no signs of infection at this time. Overall the patient is doing quite well. The wound measurements are not significantly smaller today although they were over the past two visits we will see how things go. 05/27/18 on evaluation today patient's sacral wound actually appears to be doing fairly well at this point. She does have a deep tissue injury yet again on the medial aspect of the wound which is something that we seem to be seeing often. There is some question as to whether or not this is actually being caused by the Wound VAC bunching up and then her somewhat laying on it even though they are attempting to offload this as best as possible. Patient son states that overall they do the best they can trying offload her and I definitely believe him. I may want to see about putting the Wound VAC on hold for a  couple weeks and see if this happens just with standard dressing changes. 06/10/18 on evaluation today patient actually appears to be doing better in regard to her sacral wound. Overall there's no signs of  infection, no odor, and no significant skin breakdown around the edge of the wound I feel like this is done much better 50 Dakinos soaked gauze dressing as opposed to legalizing anything such as the Wound VAC. I believe that we discontinue the Wound VAC as of today. 06/24/18 on evaluation today patient actually appears to be doing well in regard to her sacral ulcer. There's no signs of infection, no odor, and in my pinion no concerns at all. I feel like she is making good progress as far as I'm concerned. 07/12/18 on evaluation today patient appears to be doing very well in regard to her sacral ulcer. She has been tolerating the dressing changes without complication. Fortunately there does not appear to be any evidence of active infection at this time she is doing excellent as far as I'm concerned at this point. 08/11/18 on evaluation today patient's sacral wound actually appears to be doing quite well at this point. The main issue I see is that she continues to have some maceration and skin breakdown due to moisture over the right periwound location unfortunately. We've investigated the possibility of using zinc oxide before but have not utilized it simply due to the fact that she continues to state. Her son anyways that she is allergic to this. They have not tried it recently and may be completely unrelated to the reaction she had before. Nonetheless this may be something you want to attempt will discuss that in the plan. 10/04/18 on evaluation today patient actually appears to be doing quite well in regard to her sacral wound. It's been roughly 2 months since I last seen her in that time her wound has definitely made progress. This is very slow but again nonetheless does seem to be trending in the correct direction. Overall very pleased in this regard. No fevers, chills, nausea, or vomiting noted at this time. 11/07/2018 on evaluation today patient appears to be doing decently well with regard to her  sacral wound based on what I am seeing at this point. She is seen by way of a telehealth visit due to the COVID-19 national emergency and to be honest the patient is somewhat reluctant to come out to be seen for visits which I completely understand. They feel much safer keeping her home as much as possible due to her adequate condition in general. The only issue that she has had recently she did have some wheezing and subsequently chest congestion she was just placed on albuterol along with prednisone at this point. She seems to be doing much better even after 24 hours of the prednisone according to her [name]. Overall they are pleased with how things seem to be progressing. Jessica [name], Jessica [name] ([id number]) 11/28/2018 upon evaluation today patient is seen by way of telehealth visit since she is still somewhat reluctant to come out due to the COVID-19 pandemic. Subsequently she seems to be doing well and infected I could not obtain actual measurements for the wound the overall appearance of the wound is dramatically improved. She does not appear to have nearly as much space at the 12:00 location and in fact her [name] who is taking care of her mainly states that he is barely able to get anything up into that region is filled and so nicely. Overall  I feel like she is making great progress. 12/12/2018 patient is seen today via a telehealth visit again secondary to her concerns about coming out during the COVID-19 national pandemic. Nonetheless she appears to be doing quite well at this time which is good news. There is no signs of active infection at this time. Overall patient seems to be very pleased as is her son who takes very good care of her. 01/16/2019 on evaluation today patient actually appears to be doing very well with regard to her wound. We have been doing zoom calls quite a bit since the COVID-19 pandemic began just due to the fact that she has been somewhat concerned about coming out.  With that being said she seems to have done very well and in fact the wound is measuring quite a bit smaller today compared to the last measurement. Fortunately there is no signs of active infection at this time. She is also having great improvement overall in her demeanor she is very upbeat and pleased with how things have progressed. 02/21/2019 upon evaluation today patient actually appears to be doing very well with regard to her wound. She has been tolerating the dressing changes her sons been performing these without complication and seems to do an excellent job in my opinion. I am actually very pleased with the overall appearance. The wound is significantly smaller compared to what its been in the past by quite a bit. 03/21/19 on evaluation today patient appears to be doing excellent in regard to her wound. She's been tolerating the dressing changes without complication and there does not appear to be evidence of active infection which is good news. She is still tolerating the Dakinos Moistened gauze packing without complication. 04/18/19 on evaluation today patient is seen by way of a telehealth visit due to her concerns of being exposed to Covid-19 Virus if she comes into the clinic. Subsequently her son is present to help with the visit today. They have been performing the dressing changes specifically Dakins soloution at this point in order to help with granulation in the base of the wound. Fortunately there is no evidence of active infection currently. Her son does note that he feels like maybe some bone in the base of the wound centrally that he is somewhat concerned about. With that being said it's not entirely visible but he states that he can palpate it. 05/25/2019 upon evaluation today patient appears to be doing very well in fact in regard to her wounds. I am very pleased with how things seem to be progressing and though she is not showing any signs of infection I do believe that it may  be time to switch the dressing to something a little different I think she has a lot of irritation in the periwound and this is likely coming some from the fact that she does have a lot of moisture. I think the wound is small enough we can see about initiation of a silver alginate dressing. I think this will help keep the periwound from breaking down. Overall however I am very pleased with how things stand. 06/15/2019 upon evaluation today patient appears to be doing very well with regard to her wound. Overall the periwound also seems to be doing much better. She does have several filament-like projections of her skin on the periwound region that been present for some time unfortunately they keep getting pulled and stuck to the dressing causing this area to bleed. I think we are getting need to shave/trim  these down today to help her to more fully heal in this periwound location so this would not continue to be an issue for her. 07/06/2019 upon evaluation today patient actually appears to be doing excellent in regard to her wound. She is showing signs of improvement the wound is measuring smaller and is looking very well. In general I am extremely happy. The areas that we trimmed off and periwound where she had skin tags last week actually seem to have done excellent they never even bled again. Electronic Signature(s) Signed: 07/06/2019 1:51:24 PM By: Lenda Kelp PA-C Entered By: Lenda Kelp on 07/06/2019 13:51:23 Jessica [name] ([id number]) -------------------------------------------------------------------------------- Physical Exam Details [name]. Date of Service: 07/06/2019 11:00 AM Medical Record Number: [id number] Patient Account Number: [id number] Date of Birth/Sex: 24-Sep-1945 (74 y.o. F) Treating RN: Primary Care Provider: Annita Brod Other Clinician: Referring Provider: Annita Brod Treating Provider/Extender: STONE III, Cortne Amara Weeks in Treatment:  110 Constitutional Well-nourished and well-hydrated in no acute distress. Respiratory normal breathing without difficulty. Psychiatric this patient is able to make decisions and demonstrates good insight into disease process. Alert and Oriented x 3. pleasant and cooperative. Notes Patient's wound bed currently showed signs of good granulation at this time. Fortunately there is no signs of active infection which is good news. Overall I am extremely happy with the progress that the patient is making and again I see no signs of anything concerning at this point. Electronic Signature(s) Signed: 07/06/2019 1:51:41 PM By: Lenda Kelp PA-C Entered By: Lenda Kelp on 07/06/2019 13:51:41 Jessica [name] ([id number]) -------------------------------------------------------------------------------- Physician Orders Details [name]. Date of Service: 07/06/2019 11:00 AM Medical Record Number: [id number] Patient Account Number: [id number] Date of Birth/Sex: 03-27-1946 (74 y.o. F) Treating RN: Rodell Perna Primary Care Provider: Annita Brod Other Clinician: Referring Provider: Annita Brod Treating Provider/Extender: Linwood Dibbles, Mia Milan Weeks in Treatment: 110 Verbal / Phone Orders: No Diagnosis Coding ICD-10 Coding Code Description L89.154 Pressure ulcer of sacral region, stage 4 M48.00 Spinal stenosis, site unspecified R54 Age-related physical debility G89.4 Chronic pain syndrome E78.2 Mixed hyperlipidemia L89.93 Pressure ulcer of unspecified site, stage 3 Wound Cleansing Wound #1 Medial Sacrum o Other: - please cleanse sacral wound with dakins moistened gauze - do not spray dakins on wound Anesthetic (add to Medication List) Wound #1 Medial Sacrum o Topical Lidocaine 4% cream applied to wound bed prior to debridement (In Clinic Only). Skin Barriers/Peri-Wound Care Wound #1 Medial Sacrum o Skin Prep Primary Wound Dressing Wound #1 Medial  Sacrum o Alginate - with dry gauze behind alginate to fill space Secondary Dressing Wound #1 Medial Sacrum o ABD pad - secure with tape Dressing Change Frequency Wound #1 Medial Sacrum o Change dressing twice daily. Follow-up Appointments Wound #1 Medial Sacrum o Return Appointment in 3 weeks. Off-Loading Wound #1 Medial Sacrum o Turn and reposition every 2 hours Additional Orders / Instructions Wound #1 Medial Sacrum o Increase protein intake. Home Health Wound #1 Medial Sacrum Jessica [name], Jessica [name] ([id number]) o Continue Home Health Visits - [facility] o Home Health Nurse may visit PRN to address patientos wound care needs. o FACE TO FACE ENCOUNTER: MEDICARE and MEDICAID PATIENTS: I certify that this patient is under my care and that I had a face-to- face encounter that meets the physician face-to-face encounter requirements with this patient on this date. The encounter with the patient was in whole or in part for the following MEDICAL CONDITION: (primary reason for Home Healthcare) MEDICAL  NECESSITY: I certify, that based on my findings, NURSING services are a medically necessary home health service. HOME BOUND STATUS: I certify that my clinical findings support that this patient is homebound (i.e., Due to illness or injury, pt requires aid of supportive devices such as crutches, cane, wheelchairs, walkers, the use of special transportation or the assistance of another person to leave their place of residence. There is a normal inability to leave the home and doing so requires considerable and taxing effort. Other absences are for medical reasons / religious services and are infrequent or of short duration when for other reasons). o If current dressing causes regression in wound condition, may D/C ordered dressing product/s and apply Normal Saline Moist Dressing daily until next Wound Healing Center / Other MD appointment. Notify Wound Healing Center of regression in  wound condition at [phone]. o Please direct any NON-WOUND related issues/requests for orders to patient's Primary Care Physician Electronic Signature(s) Signed: 07/06/2019 3:40:54 PM By: Rodell Perna Signed: 07/06/2019 4:59:35 PM By: Lenda Kelp PA-C Entered By: Rodell Perna on 07/06/2019 11:50:11 Jessica [name] ([id number]) -------------------------------------------------------------------------------- Problem List Details [name]. Date of Service: 07/06/2019 11:00 AM Medical Record Number: [id number] Patient Account Number: [id number] Date of Birth/Sex: [birth year] (74 y.o. F) Treating RN: Primary Care Provider: Annita Brod Other Clinician: Referring Provider: Annita Brod Treating Provider/Extender: STONE III, Kayln Garceau Weeks in Treatment: 110 Active Problems ICD-10 Evaluated Encounter Code Description Active Date Today Diagnosis L89.154 Pressure ulcer of sacral region, stage 4 05/27/2017 No Yes M48.00 Spinal stenosis, site unspecified 05/27/2017 No Yes R54 Age-related physical debility 05/27/2017 No Yes G89.4 Chronic pain syndrome 05/27/2017 No Yes E78.2 Mixed hyperlipidemia 05/27/2017 No Yes L89.93 Pressure ulcer of unspecified site, stage 3 05/27/2017 No Yes Inactive Problems Resolved Problems Electronic Signature(s) Signed: 07/06/2019 11:34:59 AM By: Lenda Kelp PA-C Entered By: Lenda Kelp on 07/06/2019 11:34:59 Jessica [name] ([id number]) -------------------------------------------------------------------------------- Progress Note Details [name]. Date of Service: 07/06/2019 11:00 AM Medical Record Number: [id number] Patient Account Number: [id number] Date of Birth/Sex: [birth year] (74 y.o. F) Treating RN: Primary Care Provider: Annita Brod Other Clinician: Referring Provider: Annita Brod Treating Provider/Extender: Linwood Dibbles, Mandolin Falwell Weeks in Treatment: 110 Subjective Chief Complaint Information  obtained from Patient she is here for evaluation of a sacral ulcer History of Present Illness (HPI) 05/27/17-she is here in initial evaluation for a left-sided sacral stage IV pressure ulcer and bilateral lower extremity, lateral aspect, unstageable pressure ulcers. She is accompanied by her husband and her son, who are her primary caregivers. She is bedbound secondary to spinal stenosis. According to her son and husband she was hospitalized from 10/5-11/1 with healthcare associated pneumonia and altered mental status. During her hospitalization she was intubated, extubated on 10/27. She was discharged with Foley catheter and follow up with urology. According to her son and spouse she developed the sacral ulcer during hospitalization. Home health has been applying Santyl daily. She does have a low air loss mattress and is repositioned every 2-3 hours per her family's report. According to the son and spouse she had an appointment with urology on 12/18 and during that appointment developed discoloration to her bilateral lower extremities which ultimately developed into unstageable pressure ulcers to the lateral aspects of her bilateral lower extremities. There has been no topical treatment applied to these. She continues to have home health. There is no concerns expressed regarding dietary intake, stating she eats 3 meals a day, eating was provided; she is  supplemented with boost with protein. 06/03/17-she is here in follow-up evaluation for sacral and bilateral lower extremity ulcers. Plain film x-ray done today reveals no distraction to the sacrum or coccyx, no visible abnormalities. Home health has ordered the negative pressure wound system but it has not been initiated. We will continue with Hydrofera Blue until initiation of negative pressure wound system and continue with Santyl to bilateral lower extremity ulcers. Follow-up next week 06/10/17-she is here in follow-up evaluation for sacral and  bilateral lower extremity ulcers. The wound VAC will be available tomorrow per home health. We will initiate wound VAC therapy to the sacral ulcer 3 times weekly (Thursday, Saturday/Sunday, Tuesday). We will continue with Santyl to the lower extremity ulcers. The patient's son is checking into home health therapy over the weekend for [facility] changes, with the understanding that if VAC changes cannot be performed over the weekend he will need to change his mother's appointments to Monday, Wednesday or Friday. The sacral ulcer clinically does not appear infected but there has been a change in the amount of drainage acutely, there is no significant amount of devitalized tissue, there is no malodor. Wound culture was obtained to evaluate for occult infection we will hold off on antibiotic therapy until sensitivities are resulted. 06/18/17 on evaluation today patient appears to be doing fairly well in my opinion although this is the Jessica time I have seen this patient she has been previously evaluated by Tacey Ruiz here in our office. She is going to be switching to Fridays to see me due to the Wound VAC schedule being changed on Monday, Wednesday, and Friday. Subsequently she seems to be doing fairly well with the Wound VAC. Her son who was present during evaluation today states that he somewhat stresses of the Wound VAC in making sure that it was functioning appropriately. With that being said everything seems to be working well he knows what settings on the [facility] itself to look at and ensure that it is functioning properly. Overall the wound appears to be nice and clean there is no need for debridement today. She has no discomfort in her bilateral lower extremity ulcers also appear to be improving based on measurements and what this honest tell me about the overall appearance. 07/02/17 on evaluation today I noted in patients wound bed that was actually an odor that had not previously been noted. Subsequently there was  also a small area of bone that was not previously noted during my last evaluation. This did appear to be necrotic and was being somewhat forced out by the body around the region of granulation. She does not have any pain which is good news. Nonetheless the overall appearance of the ulcer is making me concerned for the patient having developed osteomyelitis. Currently she is not been on any antibiotics and the Wound VAC has been doing fairly well in general. With that being said I do not think we need to continue the Wound VAC if she potentially has a bone infection. At least not until it is properly addressed with antibiotics. 07/09/17 on evaluation today patient appears to actually be doing very well in regard to her sacral ulcer compared to last week. There is actually no exposed bone at this point. 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 With that being said still I do believe she needs to have  a referral to infectious disease due to the osteomyelitis I am gonna recommend that she continue with the doxycycline based on the culture results which showed Eikenella Corrodens as the organism identified that tetracycline should work for. She does not seem to be having any discomfort whatsoever at this point. 07/16/17 on evaluation today patient actually appears to be doing rather well in regard to her sacral wound. I think that the original wound site actually appears much better than previously noted. With that being said she does have a new superficial injury on the right sacral region which appears to be due to according to the sign transport that occurred for her MRI unfortunately. Fortunately this is not too deep and I do think it can be managed but it was a new area that was not previously noted. Otherwise she has been tolerating the Dakin s  soaked gauze packing without complication. 07/30/17 on evaluation today patient presents for reevaluation concerning her sacral wound. She has been tolerating the dressing changes without complication. With that being said her wound is doing so well at this point that I think she may be at the point where we could reinitiate the Wound VAC currently and hopefully see good results from this. I'm very pleased with how she has responded to the [facility] s soaked gauze packing. Jessica [name], Jessica [name] ([id number]) 08/13/17 evaluation today patient appears to be doing excellent in regard to her lower extremity ulcer this seems to be cleaning up very nicely. In regard to the sacral ulcer the area of trauma on the right lateral portion of the wound actually appears to be much better than previously noted during the last evaluation. The word has definitely filled in and the Wound VAC appears to be helpful I do believe. Overall I'm very pleased with how things seem to be progressing at this time. Patient likewise is also happy that things are doing well. 08/27/17 on evaluation today patient presents for follow-up concerning her ongoing issues with her sacral ulcer and lower extremity ulcer. Fortunately she has been tolerating the dressing changes well complication the Wound VAC in general seems to have done very well up to this point. She does not have any evidence of infection which is good news. She does have some dark discoloration in central portion of the wound which is troublesome for the possibility of pressure injury to the site it sounds like her prior air mattress was not functioning properly her son has just bought a new one for her this is doing much better. Otherwise things seem to be progressing nicely. 09/10/17 on evaluation today patient presents for follow-up concerning her sacral wound and lower extremity ulcer. She has been tolerating the switch of her dressing changes to the Dakin s soaked gauze dressing  very well in regard to the sacrum. The left lateral lower extremity ulcer appears to have a new area open just distal to the one that we have been treating with Santyl and this is new since her last visit. There does not appear to be any evidence of infection which is good news. With that being said there's a lot of maceration here at the site and I feel this is likely due to the switch and the dressings it appears that due to the sticking of the dressings it will switch to utilizing a telfa pad over the Santyl. I think this caused a lot of drainage to be collected and situated in the region just under the dressing which has led to  this causing which duration of breakdown. Overall there does not appear to be any significant pain which is good news. Of note I did actually have a conversation with the radiologist who is an interventional radiologist with [facility]. 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 With that being said there does not appear to be any rush to get this done according to the interventional radiologist whom I spoke with. Therefore after discussion it was determined that there gonna wait about six months prior to considering the procedure to get time for hopefully the sacral wound to heal in the interim to at least some degree. 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 According to nursing staff these are all improved, the negative pressure wound therapy system was put on hold last week. We will resume negative pressure wound therapy today, continue with Santyl to the lower extremity and she will follow-up next week. 09/24/17 on evaluation today unfortunately the patient's wound appears to be doing significantly worse compared to last week's evaluation and even my valuation the week before. She actually has bone noted on the left wound margin in the region  of undermining unfortunately. This was not noted during the last evaluation. I am concerned about the fact that this seems to be worse not better since our last visit with her. My biggest concern is that she's likely developing a worsening osteomyelitis. This was discussed with patient and her son today during the office visit. 05/03/17 on evaluation today patient actually appears to be doing rather well in regard to her sacral ulcer compared to last week's evaluation. She has been tolerating the dressing changes without complication. Fortunately even though we're not utilizing the Wound VAC she seems to be making some strides in seeing the wound area overall improved quite dramatically in my pinion in just one weeks time. She also seems to be staying off of this to the point that I do not see any evidence of new injury which is excellent news. Whatever she's been doing in that regard over the past week I would want her to continue. I did also review the results of her bone culture which revealed group B strep as the responsible organism. Again this is what's causing her osteomyelitis she does have infectious disease appointment scheduled for 10/11/17 10/08/1898 valuation today patient appears to be doing better for the most part in regard to the sacral wound. Overall she is showing signs of having granulation of the bone which is good news. There is an area towards the left of the wound where she does seem to be showing some signs of opening this it would be due to additional pressure to the area unfortunately. There does not appear to be any evidence of infection spreading which is good news that do not see any evidence of significant purulent discharge which is also good news. 10/26/17 on evaluation today patient sacral ulcer actually appears to be very healthy and doing well in regard to the overall appearance of the wound. With that being said she does seem to be having some issues at this point in  time with some shear/friction injury of the sacrum from where she was transported to [facility] for her infectious disease appointment. She has been placed on IV antibiotic therapy I will have to go back and find that note for review as I did not have access to  that today. Nonetheless I will add the next visit be sure to document the exact antibiotics that she is taking at this point. Nonetheless I do believe the wound is doing better there's no bone exposure nothing that requires debridement in regard to the sacral wound. Likewise her left lateral lower extremity ulcer also appears to be doing significantly better. In general I feel like things are showing signs of improvement all around. 11/12/17 on evaluation today patient appears to be doing rather well in regard to her sacral wound. In general I do see signs of granulation at this point. Fortunately there does not appear to be any evidence of infection which is good news as well. In general overall happy with the appearance. With that being said I'll be see I would like for this to be progressing more rapidly as with the patient and her son but nonetheless at least things do not appear to be doing worse. Her lower extremity ulcer is doing significantly better. 11/26/17 on evaluation today patient appears to be doing a little better in regard to the sacral wound especially in the peripheral of the wound bed. She actually did have an abrasion on the right gluteal region that's completely resolved to this point. Her lower extremity also appears to be for the most part completely resolved. Overall the sacrum itself seems to be the one thing that is still open and given her trouble. No fevers, chills, nausea, or vomiting noted at this time. She actually has an appointment next week with infectious disease for recheck to see how things are doing. She maybe switch to oral medications at that point. 12/09/17 on evaluation today patient actually appears to be  doing much better in regard to her sacral wound. I do feel like she has made good progress at this time as far as healing is concerned. In fact the wound looks better to me today than it has in quite some time. She is on oral antibiotic therapy and no longer on the IV antibiotics. She did see infectious disease last week. 12/24/17 on evaluation today patient's wounds actually appear to be doing decently well in regard to the sacral wound in particular. When it comes to her lower extremity ulcers both appear to be completely healed at this point in the one area where she had lived the rash has completely resolved at this time. Overall very pleased with the progress that has been made. Nonetheless the patient seems to be doing well in general. She still does have quite a bit of healing to do in regard to the sacral wound but I feel like she is making progress. 01/07/18 on evaluation today patient actually appears to be doing excellent today regard to her sacral wound. The granular quality in the base of the Jessica [name], Jessica [name]. ([id number]) wound is excellent and shown signs of good improvement there's no evidence of contusion nor deep tissue injury and the periwound actually appears to be doing well. In general I'm very pleased with the overall appearance. 02/04/18 on evaluation today patient's wound actually appears to be doing decently well as far as the granulation is concerned. She has been tolerating the dressing changes without complication. Right now we are still using the Wound VAC. Nonetheless overall there apparently is some cater to the wound and this is concerning simply for the fact that she could be developing a soft tissue infection again. She's had this happen in the past and at that time responded very well to the antibiotics.  Nonetheless I don't think I would likely put her on anything prophylactically but rather wait for the results of the culture to return. 02/18/18 on evaluation today  patient actually appears to be doing fairly well. She has been tolerating the dressing changes. And the Wound VAC seems to be doing well. Overall I'm very pleased with how things have improved over the past couple weeks since I last saw her. Fortunately there does not appear to be any evidence of overt infection at this time which is good news. She has been taking the antibiotics without complication. Specifically that is the Bactrim DS which was prescribed on 02/08/18. 03/18/18 on evaluation today patient's wound actually does have quite a bit of odor noted compared to previous. With that being said the wound itself overall does not appear to be too significant or worse. There is no event noted in the wound bed which is good news. With that being said the patient has been worried as the home health nurse stated that she thought she saw bone in the base of the wound. Nonetheless there's a little bit of bruising along the left lateral border of the wound bed which again does not appear to be terrible we have noticed this before but other than this the majority the wound bed appears to be doing excellent in my pinion. I am wondering if there may be some bacterial colonization. This could be leading to some of the odor that we are noting with the Wound VAC. 04/01/18 on evaluation today patient appears to actually be doing well in regard to the overall appearance of the wound itself. She also does not have any odor at this point in regard to the wound surface and I do believe the Bacons is extremely helpful in this regard. With that being said she does have a little bit of deep tissue injury on the medial aspect of the wound. Again last time it was more lateral. The lateral seems to have improved quite well. Nonetheless she also notes by way of her son that this has been very macerated in the past several weeks since I last saw her. Though things overall seem to be doing better I still think that the Wound  VAC may help with managing her fluid better I'm wondering if we can attempt a gauze Wound VAC my hope is that this will be able to manage the moisture control as well as continuing with managing the odor of the wound and bacterial load since the Dakin s has done excellent in helping in that regard. 04/15/17 on evaluation today patient actually appears to be doing much better in regard to the overall appearance of her sacral wound. I do believe that the gauze Vac has been of benefit for her. Overall she is tolerating this without complication which is also good news. There is no sign of injury to the sacral region at this point. 04/29/18 on evaluation today patient appears to be doing well in regard to her sacral ulcer. She sure signs of good granulation and the Gauze VAC is doing very well for her. Overall I'm pleased in this regard. The biggest issue is that the time from Friday till Monday seems to be quite long for her as far as the amount of time to keep the Wound VAC sealed. For that reason her son wonders if he can take this off on Sunday morning in order to give her day of just wet to dry dressing's with the Dakin's solution I  think this would be okay. 05/13/18 on evaluation today patient appears to be doing okay in regard to her sacral ulcer. There does not appear to be any evidence of overt infection at this time which is good news. In general the drainage and moisture appears to be significantly improved this was after the pressure on the Wound VAC was increased after they contacted our office. With that being said a lot of the dark tissue around the edge of the wound has improved in the maceration is significantly better this seems to be managing much better. Fortunately there is no signs of infection at this time. Overall the patient is doing quite well. The wound measurements are not significantly smaller today although they were over the past two visits we will see how things go. 05/27/18 on  evaluation today patient's sacral wound actually appears to be doing fairly well at this point. She does have a deep tissue injury yet again on the medial aspect of the wound which is something that we seem to be seeing often. There is some question as to whether or not this is actually being caused by the Wound VAC bunching up and then her somewhat laying on it even though they are attempting to offload this as best as possible. Patient son states that overall they do the best they can trying offload her and I definitely believe him. I may want to see about putting the Wound VAC on hold for a couple weeks and see if this happens just with standard dressing changes. 06/10/18 on evaluation today patient actually appears to be doing better in regard to her sacral wound. Overall there's no signs of infection, no odor, and no significant skin breakdown around the edge of the wound I feel like this is done much better 50 Dakin s soaked gauze dressing as opposed to legalizing anything such as the Wound VAC. I believe that we discontinue the Wound VAC as of today. 06/24/18 on evaluation today patient actually appears to be doing well in regard to her sacral ulcer. There's no signs of infection, no odor, and in my pinion no concerns at all. I feel like she is making good progress as far as I'm concerned. 07/12/18 on evaluation today patient appears to be doing very well in regard to her sacral ulcer. She has been tolerating the dressing changes without complication. Fortunately there does not appear to be any evidence of active infection at this time she is doing excellent as far as I'm concerned at this point. 08/11/18 on evaluation today patient's sacral wound actually appears to be doing quite well at this point. The main issue I see is that she continues to have some maceration and skin breakdown due to moisture over the right periwound location unfortunately. We've investigated the possibility of using zinc  oxide before but have not utilized it simply due to the fact that she continues to state. Her son anyways that she is allergic to this. They have not tried it recently and may be completely unrelated to the reaction she had before. Nonetheless this may be something you want to attempt will discuss that in the plan. 10/04/18 on evaluation today patient actually appears to be doing quite well in regard to her sacral wound. It's been roughly 2 months since I last seen her in that time her wound has definitely made progress. This is very slow but again nonetheless does seem to be trending in the correct direction. Overall very pleased in this regard.  No fevers, chills, nausea, or vomiting noted at this time. 11/07/2018 on evaluation today patient appears to be doing decently well with regard to her sacral wound based on what I am seeing at this point. She Jessica [name], Jessica [name] ([id number]) is seen by way of a telehealth visit due to the COVID-19 national emergency and to be honest the patient is somewhat reluctant to come out to be seen for visits which I completely understand. They feel much safer keeping her home as much as possible due to her adequate condition in general. The only issue that she has had recently she did have some wheezing and subsequently chest congestion she was just placed on albuterol along with prednisone at this point. She seems to be doing much better even after 24 hours of the prednisone according to her [name]. Overall they are pleased with how things seem to be progressing. 11/28/2018 upon evaluation today patient is seen by way of telehealth visit since she is still somewhat reluctant to come out due to the COVID-19 pandemic. Subsequently she seems to be doing well and infected I could not obtain actual measurements for the wound the overall appearance of the wound is dramatically improved. She does not appear to have nearly as much space at the 12:00 location and in fact her  [name] who is taking care of her mainly states that he is barely able to get anything up into that region is filled and so nicely. Overall I feel like she is making great progress. 12/12/2018 patient is seen today via a telehealth visit again secondary to her concerns about coming out during the COVID-19 national pandemic. Nonetheless she appears to be doing quite well at this time which is good news. There is no signs of active infection at this time. Overall patient seems to be very pleased as is her son who takes very good care of her. 01/16/2019 on evaluation today patient actually appears to be doing very well with regard to her wound. We have been doing zoom calls quite a bit since the COVID-19 pandemic began just due to the fact that she has been somewhat concerned about coming out. With that being said she seems to have done very well and in fact the wound is measuring quite a bit smaller today compared to the last measurement. Fortunately there is no signs of active infection at this time. She is also having great improvement overall in her demeanor she is very upbeat and pleased with how things have progressed. 02/21/2019 upon evaluation today patient actually appears to be doing very well with regard to her wound. She has been tolerating the dressing changes her sons been performing these without complication and seems to do an excellent job in my opinion. I am actually very pleased with the overall appearance. The wound is significantly smaller compared to what its been in the past by quite a bit. 03/21/19 on evaluation today patient appears to be doing excellent in regard to her wound. She's been tolerating the dressing changes without complication and there does not appear to be evidence of active infection which is good news. She is still tolerating the Dakin s Moistened gauze packing without complication. 04/18/19 on evaluation today patient is seen by way of a telehealth visit due to  her concerns of being exposed to Covid-19 Virus if she comes into the clinic. Subsequently her son is present to help with the visit today. They have been performing the dressing changes specifically Dakins soloution at  this point in order to help with granulation in the base of the wound. Fortunately there is no evidence of active infection currently. Her son does note that he feels like maybe some bone in the base of the wound centrally that he is somewhat concerned about. With that being said it's not entirely visible but he states that he can palpate it. 05/25/2019 upon evaluation today patient appears to be doing very well in fact in regard to her wounds. I am very pleased with how things seem to be progressing and though she is not showing any signs of infection I do believe that it may be time to switch the dressing to something a little different I think she has a lot of irritation in the periwound and this is likely coming some from the fact that she does have a lot of moisture. I think the wound is small enough we can see about initiation of a silver alginate dressing. I think this will help keep the periwound from breaking down. Overall however I am very pleased with how things stand. 06/15/2019 upon evaluation today patient appears to be doing very well with regard to her wound. Overall the periwound also seems to be doing much better. She does have several filament-like projections of her skin on the periwound region that been present for some time unfortunately they keep getting pulled and stuck to the dressing causing this area to bleed. I think we are getting need to shave/trim these down today to help her to more fully heal in this periwound location so this would not continue to be an issue for her. 07/06/2019 upon evaluation today patient actually appears to be doing excellent in regard to her wound. She is showing signs of improvement the wound is measuring smaller and is looking very  well. In general I am extremely happy. The areas that we trimmed off and periwound where she had skin tags last week actually seem to have done excellent they never even bled again. Objective Constitutional Well-nourished and well-hydrated in no acute distress. Vitals Time Taken: 10:56 AM, Height: 70 in, Temperature: 98.5 F, Pulse: 82 bpm, Respiratory Rate: 16 breaths/min, Blood Pressure: 136/60 mmHg. Respiratory normal breathing without difficulty. Psychiatric this patient is able to make decisions and demonstrates good insight into disease process. Alert and Oriented x 3. pleasant and cooperative. Jessica [name], Jessica [name] ([id number]) General Notes: Patient's wound bed currently showed signs of good granulation at this time. Fortunately there is no signs of active infection which is good news. Overall I am extremely happy with the progress that the patient is making and again I see no signs of anything concerning at this point. Integumentary (Hair, Skin) Wound #1 status is Open. Original cause of wound was Pressure Injury. The wound is located on the Medial Sacrum. The wound measures 5cm length x 3.5cm width x 2cm depth; 13.744cm^2 area and 27.489cm^3 volume. There is Fat Layer (Subcutaneous Tissue) Exposed exposed. There is no tunneling or undermining noted. There is a medium amount of sanguinous drainage noted. Foul odor after cleansing was noted. The wound margin is distinct with the outline attached to the wound base. There is large (67-100%) red, pink granulation within the wound bed. There is a small (1-33%) amount of necrotic tissue within the wound bed including Adherent Slough. Wound #6 status is Healed - Epithelialized. Original cause of wound was Pressure Injury. The wound is located on the Right Sacrum. The wound measures 0cm length x 0cm width x 0cm depth; 0cm^2  area and 0cm^3 volume. The wound is limited to skin breakdown. There is no tunneling or undermining noted. There is a none  present amount of drainage noted. The wound margin is flat and intact. There is no granulation within the wound bed. There is no necrotic tissue within the wound bed. 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 Skin Barriers/Peri-Wound Care: Wound #1 Medial Sacrum: Skin Prep Primary Wound Dressing: Wound #1 Medial Sacrum: Alginate - with dry gauze behind alginate to fill space Secondary Dressing: Wound #1 Medial Sacrum: ABD pad - secure with tape Dressing Change Frequency: Wound #1 Medial Sacrum: Change dressing twice daily. Follow-up Appointments: Wound #1 Medial Sacrum: Return Appointment in 3 weeks. Off-Loading: Wound #1 Medial Sacrum: Turn and reposition every 2 hours Additional Orders / Instructions: Wound #1 Medial Sacrum: Increase protein intake. Home Health: Wound #1 Medial Sacrum: Continue Home Health Visits - [facility] Nurse may visit PRN to address patient s wound care needs. Jessica [name], Jessica [name] ([id number]) FACE TO FACE ENCOUNTER: MEDICARE and MEDICAID PATIENTS: I certify that this patient is under my care and that I had a face-to-face encounter that meets the physician face-to-face encounter requirements with this patient on this date. The encounter with the patient was in whole or in part for the following MEDICAL CONDITION: (primary reason for Home Healthcare) MEDICAL NECESSITY: I certify, that based on my findings, NURSING services are a medically necessary home health service.  HOME BOUND STATUS: I certify that my clinical findings support that this patient is homebound (i.e., Due to illness or injury, pt requires aid of supportive devices such as crutches, cane, wheelchairs, walkers, the use of special transportation or the assistance of another person to leave their place of residence. There is a normal inability to leave the home and doing so requires considerable and taxing effort. Other absences are for medical reasons / religious services and are infrequent or of short duration when for other reasons). If current dressing causes regression in wound condition, may D/C ordered dressing product/s and apply Normal Saline Moist Dressing daily until next Wound Healing Center / Other MD appointment. Notify Wound Healing Center of regression in wound condition at [phone]. Please direct any NON-WOUND related issues/requests for orders to patient's Primary Care Physician 1. I am going to recommend currently that we go ahead and continue with the wound care measures as before specifically with regard to the silver alginate dressing which I think is doing a great job for her as far as managing the moisture and helping the wound to appropriately heal. 2. We will also recommend she continue with appropriate offloading obviously this is of utmost importance and I think she is doing a great job with that at this point. We will see patient back for reevaluation in 1 week here in the clinic. If anything worsens or changes patient will contact our office for additional recommendations. Electronic Signature(s) Signed: 07/06/2019 1:53:55 PM By: Lenda Kelp PA-C Entered By: Lenda Kelp on 07/06/2019 13:53:54 Jessica [name] ([id number]) -------------------------------------------------------------------------------- SuperBill Details [name]. Date of Service: 07/06/2019  Medical Record Number: [id number] Patient Account Number: [id number] Date of  Birth/Sex: 1945/09/01 (74 y.o. F) Treating RN: Primary Care Provider: Annita Brod Other Clinician: Referring Provider: Annita Brod Treating Provider/Extender: STONE III, Edris Friedt Weeks in Treatment: 110 Diagnosis Coding ICD-10 Codes Code Description L89.154 Pressure ulcer of sacral region, stage 4 M48.00 Spinal stenosis, site unspecified R54 Age-related physical debility G89.4 Chronic pain syndrome E78.2 Mixed hyperlipidemia L89.93 Pressure ulcer of unspecified site, stage 3 Facility Procedures CPT4 Code: 83382505 Description: 99213 - WOUND CARE VISIT-LEV 3 EST PT Modifier: Quantity: 1 Physician Procedures CPT4 Code: [id number] Description: 99213 - WC PHYS LEVEL 3 - EST PT Modifier: Quantity: 1 CPT4 Code: Description: ICD-10 Diagnosis Description L89.154 Pressure ulcer of sacral region, stage 4 M48.00 Spinal stenosis, site unspecified G89.4 Chronic pain syndrome R54 Age-related physical debility Modifier: Quantity: Electronic Signature(s) Signed: 07/06/2019 1:54:15 PM By: Lenda Kelp PA-C Entered By: Lenda Kelp on 07/06/2019 13:54:13

## 2019-07-27 ENCOUNTER — Ambulatory Visit: Payer: Medicare Other | Admitting: Physician Assistant

## 2019-08-02 ENCOUNTER — Ambulatory Visit: Payer: Medicare Other | Admitting: Internal Medicine

## 2019-08-15 ENCOUNTER — Encounter: Payer: Medicare Other | Attending: Physician Assistant | Admitting: Physician Assistant

## 2019-08-15 ENCOUNTER — Other Ambulatory Visit: Payer: Self-pay

## 2019-08-15 DIAGNOSIS — E782 Mixed hyperlipidemia: Secondary | ICD-10-CM | POA: Insufficient documentation

## 2019-08-15 DIAGNOSIS — E669 Obesity, unspecified: Secondary | ICD-10-CM | POA: Insufficient documentation

## 2019-08-15 DIAGNOSIS — L89154 Pressure ulcer of sacral region, stage 4: Secondary | ICD-10-CM | POA: Insufficient documentation

## 2019-08-15 DIAGNOSIS — L97909 Non-pressure chronic ulcer of unspecified part of unspecified lower leg with unspecified severity: Secondary | ICD-10-CM | POA: Diagnosis present

## 2019-08-15 DIAGNOSIS — G894 Chronic pain syndrome: Secondary | ICD-10-CM | POA: Diagnosis not present

## 2019-08-15 DIAGNOSIS — Z7401 Bed confinement status: Secondary | ICD-10-CM | POA: Insufficient documentation

## 2019-08-15 NOTE — Progress Notes (Signed)
CHERYLYNN, LISZEWSKI ([id number]) Visit Report for 08/15/2019 Arrival Information Details [name], Jessica [name]. Date of Service: 08/15/2019 11:00 AM Medical Record Number: [id number] Patient Account Number: [id number] Date of Birth/Sex: [birth year] (74 y.o. F) Treating RN: Rodell Perna Primary Care Jaiana Sheffer: Annita Brod Other Clinician: Referring Jermario Kalmar: Annita Brod Treating Garrell Flagg/Extender: Linwood Dibbles, HOYT Weeks in Treatment: 115 Visit Information History Since Last Visit Added or deleted any medications: No Patient Arrived: Stretcher Any new allergies or adverse reactions: No Arrival Time: 11:11 Had a fall or experienced change in No Accompanied By: son activities of daily living that may affect Transfer Assistance: Stretcher risk of falls: Patient Identification Verified: Yes Signs or symptoms of abuse/neglect since last visito No Patient Has Alerts: Yes Hospitalized since last visit: No Patient Alerts: ALLERGIC TO ZINC Has Dressing in Place as Prescribed: Yes Pain Present Now: No Electronic Signature(s) Signed: 08/15/2019 2:30:00 PM By: Rodell Perna Entered By: Rodell Perna on 08/15/2019 11:12:01 Jessica [name] ([id number]) -------------------------------------------------------------------------------- Clinic Level of Care Assessment Details [name]. Date of Service: 08/15/2019 11:00 AM Medical Record Number: [id number] Patient Account Number: [id number] Date of Birth/Sex: 1945-05-27 (74 y.o. F) Treating RN: Curtis Sites Primary Care Romello Hoehn: Annita Brod Other Clinician: Referring Nekesha Font: Annita Brod Treating Candee Hoon/Extender: Linwood Dibbles, HOYT Weeks in Treatment: 115 Clinic Level of Care Assessment Items TOOL 4 Quantity Score []  - Use when only an EandM is performed on FOLLOW-UP visit 0 ASSESSMENTS - Nursing Assessment / Reassessment X - Reassessment of Co-morbidities (includes updates in patient status) 1 10 X- 1  5 Reassessment of Adherence to Treatment Plan ASSESSMENTS - Wound and Skin Assessment / Reassessment X - Simple Wound Assessment / Reassessment - one wound 1 5 []  - 0 Complex Wound Assessment / Reassessment - multiple wounds X- 1 10 Dermatologic / Skin Assessment (not related to wound area) ASSESSMENTS - Focused Assessment []  - Circumferential Edema Measurements - multi extremities 0 []  - 0 Nutritional Assessment / Counseling / Intervention []  - 0 Lower Extremity Assessment (monofilament, tuning fork, pulses) []  - 0 Peripheral Arterial Disease Assessment (using hand held doppler) ASSESSMENTS - Ostomy and/or Continence Assessment and Care []  - Incontinence Assessment and Management 0 []  - 0 Ostomy Care Assessment and Management (repouching, etc.) PROCESS - Coordination of Care X - Simple Patient / Family Education for ongoing care 1 15 []  - 0 Complex (extensive) Patient / Family Education for ongoing care X- 1 10 Staff obtains , Records, Test Results / Process Orders []  - 0 Staff telephones HHA, Nursing Homes / Clarify orders / etc []  - 0 Routine Transfer to another Facility (non-emergent condition) []  - 0 Routine Hospital Admission (non-emergent condition) []  - 0 New Admissions / / Ordering NPWT, Apligraf, etc. []  - 0 Emergency Hospital Admission (emergent condition) X- 1 10 Simple Discharge Coordination []  - 0 Complex (extensive) Discharge Coordination PROCESS - Special Needs []  - Pediatric / Minor Patient Management 0 []  - 0 Isolation Patient Management []  - 0 Hearing / Language / Visual special needs []  - 0 Assessment of Community assistance (transportation, D/C planning, etc.) []  - 0 Additional assistance / Altered mentation []  - 0 Support Surface(s) Assessment (bed, cushion, seat, etc.) INTERVENTIONS - Wound Cleansing / Measurement FARHA, DANO. ( ) X- 1 5 Simple Wound Cleansing - one wound []  - 0 Complex  Wound Cleansing - multiple wounds X- 1 5 Wound Imaging (photographs - any number of wounds) []  - 0 Wound Tracing (instead of photographs) X- 1  5 Simple Wound Measurement - one wound []  - 0 Complex Wound Measurement - multiple wounds INTERVENTIONS - Wound Dressings X - Small Wound Dressing one or multiple wounds 1 10 []  - 0 Medium Wound Dressing one or multiple wounds []  - 0 Large Wound Dressing one or multiple wounds []  - 0 Application of Medications - topical []  - 0 Application of Medications - injection INTERVENTIONS - Miscellaneous []  - External ear exam 0 []  - 0 Specimen Collection (cultures, biopsies, blood, body fluids, etc.) []  - 0 Specimen(s) / Culture(s) sent or taken to Lab for analysis []  - 0 Patient Transfer (multiple staff / / Similar devices) []  - 0 Simple Staple / Suture removal (25 or less) []  - 0 Complex Staple / Suture removal (26 or more) []  - 0 Hypo / Hyperglycemic Management (close monitor of Blood Glucose) []  - 0 Ankle / Brachial Index (ABI) - do not check if billed separately X- 1 5 Vital Signs Has the patient been seen at the hospital within the last three years: Yes Total Score: 95 Level Of Care: New/Established - Level 3 Electronic Signature(s) Signed: 08/15/2019 4:47:44 PM By: Entered By: on 08/15/2019 11:27:42 ( ) -------------------------------------------------------------------------------- Encounter Discharge Information Details Patient Name: . Date of Service: 08/15/2019 11:00 AM Medical Record Number: Nurse, adult Patient Account Number: Date of Birth/Sex: [birth year] (74 y.o. F) Treating RN: Primary Care Iyanla Eilers: 10/15/2019 Other Clinician: Referring Cartina Brousseau: Curtis Sites Treating Elisabet Gutzmer/Extender: Curtis Sites, HOYT Weeks in Treatment: 115 Encounter Discharge Information Items Discharge Condition: Stable Ambulatory  Status: Stretcher Discharge Destination: Home Transportation: Ambulance Accompanied By: son and EMS Schedule Follow-up Appointment: Yes Clinical Summary of Care: Electronic Signature(s) Signed: 08/15/2019 4:47:44 PM By: Jessica [name] Entered By: [id number] on 08/15/2019 11:33:32 10/15/2019 ([id number]) -------------------------------------------------------------------------------- Lower Extremity Assessment Details Patient Name: [id number]. Date of Service: 08/15/2019 11:00 AM Medical Record Number: [id number] Patient Account Number: Curtis Sites Date of Birth/Sex: 07-31-1945 (74 y.o. F) Treating RN: Linwood Dibbles Primary Care Latiffany Harwick: 10/15/2019 Other Clinician: Referring Mckyla Deckman: Curtis Sites Treating Imre Vecchione/Extender: Curtis Sites, HOYT Weeks in Treatment: 115 Electronic Signature(s) Signed: 08/15/2019 2:30:00 PM By: Jessica [name] Entered By: [id number] on 08/15/2019 11:18:09 10/15/2019 ([id number]) -------------------------------------------------------------------------------- Multi Wound Chart Details Patient Name: [id number]. Date of Service: 08/15/2019 11:00 AM Medical Record Number: [id number] Patient Account Number: Rodell Perna Date of Birth/Sex: [birth year] (74 y.o. F) Treating RN: Linwood Dibbles Primary Care Seynabou Fults: 10/15/2019 Other Clinician: Referring Zaydrian Batta: Rodell Perna Treating Natalea Sutliff/Extender: STONE III, HOYT Weeks in Treatment: 115 Vital Signs Height(in): 70 Pulse(bpm): 100 Weight(lbs): Blood Pressure(mmHg): 150/80 Body Mass Index(BMI): Temperature(F): 98.4 Respiratory Rate(breaths/min): 16 Photos: [N/A:N/A] Wound Location: Medial Sacrum N/A N/A Wounding Event: Pressure Injury N/A N/A Primary Etiology: Pressure Ulcer N/A N/A Date Acquired: 01/15/2017 N/A N/A Weeks of Treatment: 115 N/A N/A Wound Status: Open N/A N/A Measurements L x W x D (cm) 5.4x2.7x2 N/A N/A Area (cm) : 11.451 N/A N/A Volume (cm) :  22.902 N/A N/A % Reduction in Area: 68.90% N/A N/A % Reduction in Volume: 37.90% N/A N/A Classification: Category/Stage IV N/A N/A Exudate Amount: Medium N/A N/A Exudate Type: Sanguinous N/A N/A Exudate Color: red N/A N/A Foul Odor After Cleansing: Yes N/A N/A Odor Anticipated Due to Product No N/A N/A Use: Wound Margin: Distinct, outline attached N/A N/A Granulation Amount: Large (67-100%) N/A N/A Granulation Quality: Red, Pink N/A N/A Necrotic Amount: Small (1-33%) N/A N/A Exposed Structures: Fat  Layer (Subcutaneous Tissue) N/A N/A Exposed: Yes Fascia: No Tendon: No Muscle: No Joint: No Bone: No Epithelialization: Small (1-33%) N/A N/A Treatment Notes Electronic Signature(s) Signed: 08/15/2019 4:47:44 PM By: Montey Hora Entered By: Montey Hora on 08/15/2019 11:24:39 Jessica [name] ([id number]) -------------------------------------------------------------------------------- [place] Details [name]. Date of Service: 08/15/2019 11:00 AM Medical Record Number: [id number] Patient Account Number: [id number] Date of Birth/Sex: 05/27/1945 (74 y.o. F) Treating RN: Montey Hora Primary Care Belita Warsame: Clovia Cuff Other Clinician: Referring Patrich Heinze: Clovia Cuff Treating Travonne Schowalter/Extender: STONE III, HOYT Weeks in Treatment: 115 Active Inactive Pressure Nursing Diagnoses: Knowledge deficit related to causes and risk factors for pressure ulcer development Knowledge deficit related to management of pressures ulcers Potential for impaired tissue integrity related to pressure, friction, moisture, and shear Goals: Patient will remain free from development of additional pressure ulcers Date Initiated: 06/03/2017 Target Resolution Date: 06/17/2017 Goal Status: Active Patient will remain free of pressure ulcers Date Initiated: 06/03/2017 Target Resolution Date: 06/17/2017 Goal Status: Active Patient/caregiver will verbalize risk  factors for pressure ulcer development Date Initiated: 06/03/2017 Target Resolution Date: 06/17/2017 Goal Status: Active Interventions: Assess: immobility, friction, shearing, incontinence upon admission and as needed Assess potential for pressure ulcer upon admission and as needed Notes: Wound/Skin Impairment Nursing Diagnoses: Impaired tissue integrity Goals: Patient/caregiver will verbalize understanding of skin care regimen Date Initiated: 06/03/2017 Target Resolution Date: 06/17/2017 Goal Status: Active Ulcer/skin breakdown will have a volume reduction of 30% by week 4 Date Initiated: 06/03/2017 Target Resolution Date: 06/17/2017 Goal Status: Active Interventions: Assess ulceration(s) every visit Treatment Activities: Patient referred to home care : 06/03/2017 Skin care regimen initiated : 06/03/2017 Notes: Electronic Signature(s) Signed: 08/15/2019 4:47:44 PM By: Montey Hora Entered By: Montey Hora on 08/15/2019 11:24:29 Jessica [name] ([id number]) -------------------------------------------------------------------------------- Pain Assessment Details [name]. Date of Service: 08/15/2019 11:00 AM Medical Record Number: [id number] Patient Account Number: [id number] Date of Birth/Sex: [birth year] (74 y.o. F) Treating RN: Army Melia Primary Care Chrsitopher Wik: Clovia Cuff Other Clinician: Referring Alf Doyle: Clovia Cuff Treating Laneka Mcgrory/Extender: STONE III, HOYT Weeks in Treatment: 115 Active Problems Location of Pain Severity and Description of Pain Patient Has Paino No Site Locations Pain Management and Medication Current Pain Management: Electronic Signature(s) Signed: 08/15/2019 2:30:00 PM By: Army Melia Entered By: Army Melia on 08/15/2019 11:12:24 Jessica [name] ([id number]) -------------------------------------------------------------------------------- Patient/Caregiver Education Details [name]. Date  of Service: 08/15/2019 11:00 AM Medical Record Number: [id number] Patient Account Number: [id number] Date of Birth/Gender: [birth year] (74 y.o. F) Treating RN: Montey Hora Primary Care Physician: Clovia Cuff Other Clinician: Referring Physician: Clovia Cuff Treating Physician/Extender: Melburn Hake, HOYT Weeks in Treatment: 115 Education Assessment Education Provided To: Patient and Caregiver Education Topics Provided Pressure: Handouts: Other: pressure relief measures Methods: Explain/Verbal Responses: State content correctly Wound/Skin Impairment: Handouts: Other: wound care as ordered Methods: Explain/Verbal Responses: State content correctly Electronic Signature(s) Signed: 08/15/2019 4:47:44 PM By: Montey Hora Entered By: Montey Hora on 08/15/2019 11:32:40 Jessica [name] ([id number]) -------------------------------------------------------------------------------- Wound Assessment Details [name]. Date of Service: 08/15/2019 11:00 AM Medical Record Number: [id number] Patient Account Number: [id number] Date of Birth/Sex: Mar 16, 1946 (74 y.o. F) Treating RN: Army Melia Primary Care Adis Sturgill: Clovia Cuff Other Clinician: Referring Yessenia Maillet: Clovia Cuff Treating Kaysan Peixoto/Extender: STONE III, HOYT Weeks in Treatment: 115 Wound Status Wound Number: 1 Primary Etiology: Pressure Ulcer Wound Location: Medial Sacrum Wound Status: Open Wounding Event: Pressure Injury Date Acquired: 01/15/2017 Weeks Of Treatment: 115 Clustered Wound: No Photos Wound  Measurements Length: (cm) 5.4 % Redu Width: (cm) 2.7 % Redu Depth: (cm) 2 Epithe Area: (cm) 11.451 Volume: (cm) 22.902 ction in Area: 68.9% ction in Volume: 37.9% lialization: Small (1-33%) Wound Description Classification: Category/Stage IV Foul Wound Margin: Distinct, outline attached Due t Exudate Amount: Medium Sloug Exudate Type: Sanguinous Exudate Color: red Odor After  Cleansing: Yes o Product Use: No h/Fibrino Yes Wound Bed Granulation Amount: Large (67-100%) Exposed Structure Granulation Quality: Red, Pink Fascia Exposed: No Necrotic Amount: Small (1-33%) Fat Layer (Subcutaneous Tissue) Exposed: Yes Necrotic Quality: Adherent Slough Tendon Exposed: No Muscle Exposed: No Joint Exposed: No Bone Exposed: No Treatment Notes Wound #1 (Medial Sacrum) Notes alginate, ABD tape with barrier cream to periwound Electronic Signature(s) Signed: 08/15/2019 2:30:00 PM By: Eugenia Pancoast ([id number]) Entered By: Rodell Perna on 08/15/2019 11:17:55 Jessica [name] ([id number]) -------------------------------------------------------------------------------- Vitals Details [name]. Date of Service: 08/15/2019 11:00 AM Medical Record Number: [id number] Patient Account Number: [id number] Date of Birth/Sex: 1945/05/08 (74 y.o. F) Treating RN: Rodell Perna Primary Care Marilynne Dupuis: Annita Brod Other Clinician: Referring Autumnrose Yore: Annita Brod Treating Bellamy Judson/Extender: STONE III, HOYT Weeks in Treatment: 115 Vital Signs Time Taken: 11:12 Temperature (F): 98.4 Height (in): 70 Pulse (bpm): 100 Respiratory Rate (breaths/min): 16 Blood Pressure (mmHg): 150/80 Reference Range: 80 - 120 mg / dl Electronic Signature(s) Signed: 08/15/2019 2:30:00 PM By: Rodell Perna Entered By: Rodell Perna on 08/15/2019 11:12:46

## 2019-08-15 NOTE — Progress Notes (Addendum)
Jessica [name], Jessica [name] ([id number]) Visit Report for 08/15/2019 Chief Complaint Document Details [name], Jessica [name]. Date of Service: 08/15/2019 11:00 AM Medical Record Number: [id number] Patient Account Number: [id number] Date of Birth/Sex: 05/03/1945 (74 y.o. F) Treating RN: Curtis Sites Primary Care Provider: Annita Brod Other Clinician: Referring Provider: Annita Brod Treating Provider/Extender: Linwood Dibbles, Eyan Hagood Weeks in Treatment: 115 Information Obtained from: Patient Chief Complaint she is here for evaluation of a sacral ulcer Electronic Signature(s) Signed: 08/15/2019 11:20:51 AM By: Lenda Kelp PA-C Entered By: Lenda Kelp on 08/15/2019 11:20:51 Jessica [name] ([id number]) -------------------------------------------------------------------------------- HPI Details [name]. Date of Service: 08/15/2019 11:00 AM Medical Record Number: [id number] Patient Account Number: [id number] Date of Birth/Sex: September 12, 1945 (74 y.o. F) Treating RN: Curtis Sites Primary Care Provider: Annita Brod Other Clinician: Referring Provider: Annita Brod Treating Provider/Extender: Linwood Dibbles, Tecla Mailloux Weeks in Treatment: 115 History of Present Illness HPI Description: 05/27/17-she is here in initial evaluation for a left-sided sacral stage IV pressure ulcer and bilateral lower extremity, lateral aspect, unstageable pressure ulcers. She is accompanied by her husband and her son, who are her primary caregivers. She is bedbound secondary to spinal stenosis. According to her son and husband she was hospitalized from 10/5-11/1 with healthcare associated pneumonia and altered mental status. During her hospitalization she was intubated, extubated on 10/27. She was discharged with Foley catheter and follow up with urology. According to her son and spouse she developed the sacral ulcer during hospitalization. Home health has been applying Santyl daily. She does  have a low air loss mattress and is repositioned every 2-3 hours per her family's report. According to the son and spouse she had an appointment with urology on 12/18 and during that appointment developed discoloration to her bilateral lower extremities which ultimately developed into unstageable pressure ulcers to the lateral aspects of her bilateral lower extremities. There has been no topical treatment applied to these. She continues to have home health. There is no concerns expressed regarding dietary intake, stating she eats 3 meals a day, eating was provided; she is supplemented with boost with protein. 06/03/17-she is here in follow-up evaluation for sacral and bilateral lower extremity ulcers. Plain film x-ray done today reveals no distraction to the sacrum or coccyx, no visible abnormalities. Home health has ordered the negative pressure wound system but it has not been initiated. We will continue with Hydrofera Blue until initiation of negative pressure wound system and continue with Santyl to bilateral lower extremity ulcers. Follow-up next week 06/10/17-she is here in follow-up evaluation for sacral and bilateral lower extremity ulcers. The wound VAC will be available tomorrow per home health. We will initiate wound VAC therapy to the sacral ulcer 3 times weekly (Thursday, Saturday/Sunday, Tuesday). We will continue with Santyl to the lower extremity ulcers. The patient's son is checking into home health therapy over the weekend for [facility] changes, with the understanding that if VAC changes cannot be performed over the weekend he will need to change his mother's appointments to Monday, Wednesday or Friday. The sacral ulcer clinically does not appear infected but there has been a change in the amount of drainage acutely, there is no significant amount of devitalized tissue, there is no malodor. Wound culture was obtained to evaluate for occult infection we will hold off on antibiotic therapy  until sensitivities are resulted. 06/18/17 on evaluation today patient appears to be doing fairly well in my opinion although this is the first time I have seen this patient she  has been previously evaluated by Denny Peon here in our office. She is going to be switching to Fridays to see me due to the Wound VAC schedule being changed on Monday, Wednesday, and Friday. Subsequently she seems to be doing fairly well with the Wound VAC. Her son who was present during evaluation today states that he somewhat stresses of the Wound VAC in making sure that it was functioning appropriately. With that being said everything seems to be working well he knows what settings on the [facility] itself to look at and ensure that it is functioning properly. Overall the wound appears to be nice and clean there is no need for debridement today. She has no discomfort in her bilateral lower extremity ulcers also appear to be improving based on measurements and what this honest tell me about the overall appearance. 07/02/17 on evaluation today I noted in patients wound bed that was actually an odor that had not previously been noted. Subsequently there was also a small area of bone that was not previously noted during my last evaluation. This did appear to be necrotic and was being somewhat forced out by the body around the region of granulation. She does not have any pain which is good news. Nonetheless the overall appearance of the ulcer is making me concerned for the patient having developed osteomyelitis. Currently she is not been on any antibiotics and the Wound VAC has been doing fairly well in general. With that being said I do not think we need to continue the Wound VAC if she potentially has a bone infection. At least not until it is properly addressed with antibiotics. 07/09/17 on evaluation today patient appears to actually be doing very well in regard to her sacral ulcer compared to last week. There is actually no exposed bone  at this point. 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 With that being said still I do believe she needs to have a referral to infectious disease due to the osteomyelitis I am gonna recommend that she continue with the doxycycline based on the culture results which showed Eikenella Corrodens as the organism identified that tetracycline should work for. She does not seem to be having any discomfort whatsoever at this point. 07/16/17 on evaluation today patient actually appears to be doing rather well in regard to her sacral wound. I think that the original wound site actually appears much better than previously noted. With that being said she does have a new superficial injury on the right sacral region which appears to be due to according to the sign transport that occurred for her MRI unfortunately. Fortunately this is not too deep and I do think it can be managed but it was a new area that was not previously noted. Otherwise she has been tolerating the Dakinos soaked gauze packing without complication. 07/30/17 on evaluation today patient presents for reevaluation concerning her sacral wound. She has been tolerating the dressing changes without complication. With that being said her wound is doing so well at this point that I think she may be at the point where we could reinitiate the Wound VAC currently and hopefully see good results from this. I'm very pleased with how she has responded to the Dakinos soaked gauze packing. 08/13/17 evaluation today patient appears to be doing excellent in regard to her lower extremity ulcer this seems to  be cleaning up very nicely. In regard to the sacral ulcer the area of trauma on the right lateral portion of the wound actually appears to be much better than previously noted during the last evaluation. The  word has definitely filled in and the Wound VAC appears to be helpful I do believe. Overall I'm very pleased with how things seem to be progressing at this time. Patient likewise is also happy that things are doing well. 08/27/17 on evaluation today patient presents for follow-up concerning her ongoing issues with her sacral ulcer and lower extremity ulcer. Fortunately she has been tolerating the dressing changes well complication the Wound VAC in general seems to have done very well up to this point. She does not have any evidence of infection which is good news. She does have some dark discoloration in central portion of the wound which is troublesome for the possibility of pressure injury to the site it sounds like her prior air mattress was not functioning properly her son has Jessica [name], Jessica [name]. ([id number]) just bought a new one for her this is doing much better. Otherwise things seem to be progressing nicely. 09/10/17 on evaluation today patient presents for follow-up concerning her sacral wound and lower extremity ulcer. She has been tolerating the switch of her dressing changes to the Dakinos soaked gauze dressing very well in regard to the sacrum. The left lateral lower extremity ulcer appears to have a new area open just distal to the one that we have been treating with Santyl and this is new since her last visit. There does not appear to be any evidence of infection which is good news. With that being said there's a lot of maceration here at the site and I feel this is likely due to the switch and the dressings it appears that due to the sticking of the dressings it will switch to utilizing a telfa pad over the Santyl. I think this caused a lot of drainage to be collected and situated in the region just under the dressing which has led to this causing which duration of breakdown. Overall there does not appear to be any significant pain which is good news. Of note I did actually have a  conversation with the radiologist who is an interventional radiologist with [facility]. 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 With that being said there does not appear to be any rush to get this done according to the interventional radiologist whom I spoke with. Therefore after discussion it was determined that there gonna wait about six months prior to considering the procedure to get time for hopefully the sacral wound to heal in the interim to at least some degree. 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 According to nursing staff these are all improved, the negative pressure wound therapy system was put on hold last week. We will resume negative pressure wound therapy today, continue with Santyl to the lower extremity and she will follow-up next week. 09/24/17 on evaluation today unfortunately the patient's wound appears to be doing significantly worse compared to last week's evaluation and even my valuation the week before. She actually has bone noted on the left wound margin in the region of undermining unfortunately. This was not noted during the last evaluation. I am concerned about the fact that this seems to be worse not better since our last visit with her.  My biggest concern is that she's likely developing a worsening osteomyelitis. This was discussed with patient and her son today during the office visit. 05/03/17 on evaluation today patient actually appears to be doing rather well in regard to her sacral ulcer compared to last week's evaluation. She has been tolerating the dressing changes without complication. Fortunately even though we're not utilizing the Wound VAC she seems to be making some strides in seeing the wound area overall improved quite dramatically in my pinion in just one weeks time. She also seems to be staying off of this to the point  that I do not see any evidence of new injury which is excellent news. Whatever she's been doing in that regard over the past week I would want her to continue. I did also review the results of her bone culture which revealed group B strep as the responsible organism. Again this is what's causing her osteomyelitis she does have infectious disease appointment scheduled for 10/11/17 10/08/1898 valuation today patient appears to be doing better for the most part in regard to the sacral wound. Overall she is showing signs of having granulation of the bone which is good news. There is an area towards the left of the wound where she does seem to be showing some signs of opening this it would be due to additional pressure to the area unfortunately. There does not appear to be any evidence of infection spreading which is good news that do not see any evidence of significant purulent discharge which is also good news. 10/26/17 on evaluation today patient sacral ulcer actually appears to be very healthy and doing well in regard to the overall appearance of the wound. With that being said she does seem to be having some issues at this point in time with some shear/friction injury of the sacrum from where she was transported to [facility] for her infectious disease appointment. She has been placed on IV antibiotic therapy I will have to go back and find that note for review as I did not have access to that today. Nonetheless I will add the next visit be sure to document the exact antibiotics that she is taking at this point. Nonetheless I do believe the wound is doing better there's no bone exposure nothing that requires debridement in regard to the sacral wound. Likewise her left lateral lower extremity ulcer also appears to be doing significantly better. In general I feel like things are showing signs of improvement all around. 11/12/17 on evaluation today patient appears to be doing rather well in regard to her sacral  wound. In general I do see signs of granulation at this point. Fortunately there does not appear to be any evidence of infection which is good news as well. In general overall happy with the appearance. With that being said I'll be see I would like for this to be progressing more rapidly as with the patient and her son but nonetheless at least things do not appear to be doing worse. Her lower extremity ulcer is doing significantly better. 11/26/17 on evaluation today patient appears to be doing a little better in regard to the sacral wound especially in the peripheral of the wound bed. She actually did have an abrasion on the right gluteal region that's completely resolved to this point. Her lower extremity also appears to be for the most part completely resolved. Overall the sacrum itself seems to be the one thing that is still open and given her trouble. No fevers, chills,  nausea, or vomiting noted at this time. She actually has an appointment next week with infectious disease for recheck to see how things are doing. She maybe switch to oral medications at that point. 12/09/17 on evaluation today patient actually appears to be doing much better in regard to her sacral wound. I do feel like she has made good progress at this time as far as healing is concerned. In fact the wound looks better to me today than it has in quite some time. She is on oral antibiotic therapy and no longer on the IV antibiotics. She did see infectious disease last week. 12/24/17 on evaluation today patient's wounds actually appear to be doing decently well in regard to the sacral wound in particular. When it comes to her lower extremity ulcers both appear to be completely healed at this point in the one area where she had lived the rash has completely resolved at this time. Overall very pleased with the progress that has been made. Nonetheless the patient seems to be doing well in general. She still does have quite a bit of  healing to do in regard to the sacral wound but I feel like she is making progress. 01/07/18 on evaluation today patient actually appears to be doing excellent today regard to her sacral wound. The granular quality in the base of the wound is excellent and shown signs of good improvement there's no evidence of contusion nor deep tissue injury and the periwound actually appears to be doing well. In general I'm very pleased with the overall appearance. 02/04/18 on evaluation today patient's wound actually appears to be doing decently well as far as the granulation is concerned. She has been tolerating the dressing changes without complication. Right now we are still using the Wound VAC. Nonetheless overall there apparently is some cater to the wound and this is concerning simply for the fact that she could be developing a soft tissue infection again. She's had this happen in the past and at that time responded very well to the antibiotics. Nonetheless I don't think I would likely put her on anything prophylactically but rather wait for the results of the culture to return. 02/18/18 on evaluation today patient actually appears to be doing fairly well. She has been tolerating the dressing changes. And the Wound VAC seems to be doing well. Overall I'm very pleased with how things have improved over the past couple weeks since I last saw her. Fortunately there does not appear to be any evidence of overt infection at this time which is good news. She has been taking the antibiotics without complication. Specifically that is the Bactrim DS which was prescribed on 02/08/18. 03/18/18 on evaluation today patient's wound actually does have quite a bit of odor noted compared to previous. With that being said the wound itself overall does not appear to be too significant or worse. There is no event noted in the wound bed which is good news. With that being said the patient has been worried as the home health nurse  stated that she thought she saw bone in the base of the wound. Nonetheless there's a little Jessica [name], [name] ([id number]) bit of bruising along the left lateral border of the wound bed which again does not appear to be terrible we have noticed this before but other than this the majority the wound bed appears to be doing excellent in my pinion. I am wondering if there may be some bacterial colonization. This could be leading to some  of the odor that we are noting with the Wound VAC. 04/01/18 on evaluation today patient appears to actually be doing well in regard to the overall appearance of the wound itself. She also does not have any odor at this point in regard to the wound surface and I do believe the Bacons is extremely helpful in this regard. With that being said she does have a little bit of deep tissue injury on the medial aspect of the wound. Again last time it was more lateral. The lateral seems to have improved quite well. Nonetheless she also notes by way of her son that this has been very macerated in the past several weeks since I last saw her. Though things overall seem to be doing better I still think that the Wound VAC may help with managing her fluid better I'm wondering if we can attempt a gauze Wound VAC my hope is that this will be able to manage the moisture control as well as continuing with managing the odor of the wound and bacterial load since the Dakinos has done excellent in helping in that regard. 04/15/17 on evaluation today patient actually appears to be doing much better in regard to the overall appearance of her sacral wound. I do believe that the gauze Vac has been of benefit for her. Overall she is tolerating this without complication which is also good news. There is no sign of injury to the sacral region at this point. 04/29/18 on evaluation today patient appears to be doing well in regard to her sacral ulcer. She sure signs of good granulation and the Gauze VAC is  doing very well for her. Overall I'm pleased in this regard. The biggest issue is that the time from Friday till Monday seems to be quite long for her as far as the amount of time to keep the Wound VAC sealed. For that reason her son wonders if he can take this off on Sunday morning in order to give her day of just wet to dry dressing's with the Dakin's solution I think this would be okay. 05/13/18 on evaluation today patient appears to be doing okay in regard to her sacral ulcer. There does not appear to be any evidence of overt infection at this time which is good news. In general the drainage and moisture appears to be significantly improved this was after the pressure on the Wound VAC was increased after they contacted our office. With that being said a lot of the dark tissue around the edge of the wound has improved in the maceration is significantly better this seems to be managing much better. Fortunately there is no signs of infection at this time. Overall the patient is doing quite well. The wound measurements are not significantly smaller today although they were over the past two visits we will see how things go. 05/27/18 on evaluation today patient's sacral wound actually appears to be doing fairly well at this point. She does have a deep tissue injury yet again on the medial aspect of the wound which is something that we seem to be seeing often. There is some question as to whether or not this is actually being caused by the Wound VAC bunching up and then her somewhat laying on it even though they are attempting to offload this as best as possible. Patient son states that overall they do the best they can trying offload her and I definitely believe him. I may want to see about putting the Wound VAC  on hold for a couple weeks and see if this happens just with standard dressing changes. 06/10/18 on evaluation today patient actually appears to be doing better in regard to her sacral wound.  Overall there's no signs of infection, no odor, and no significant skin breakdown around the edge of the wound I feel like this is done much better 50 Dakinos soaked gauze dressing as opposed to legalizing anything such as the Wound VAC. I believe that we discontinue the Wound VAC as of today. 06/24/18 on evaluation today patient actually appears to be doing well in regard to her sacral ulcer. There's no signs of infection, no odor, and in my pinion no concerns at all. I feel like she is making good progress as far as I'm concerned. 07/12/18 on evaluation today patient appears to be doing very well in regard to her sacral ulcer. She has been tolerating the dressing changes without complication. Fortunately there does not appear to be any evidence of active infection at this time she is doing excellent as far as I'm concerned at this point. 08/11/18 on evaluation today patient's sacral wound actually appears to be doing quite well at this point. The main issue I see is that she continues to have some maceration and skin breakdown due to moisture over the right periwound location unfortunately. We've investigated the possibility of using zinc oxide before but have not utilized it simply due to the fact that she continues to state. Her son anyways that she is allergic to this. They have not tried it recently and may be completely unrelated to the reaction she had before. Nonetheless this may be something you want to attempt will discuss that in the plan. 10/04/18 on evaluation today patient actually appears to be doing quite well in regard to her sacral wound. It's been roughly 2 months since I last seen her in that time her wound has definitely made progress. This is very slow but again nonetheless does seem to be trending in the correct direction. Overall very pleased in this regard. No fevers, chills, nausea, or vomiting noted at this time. 11/07/2018 on evaluation today patient appears to be doing  decently well with regard to her sacral wound based on what I am seeing at this point. She is seen by way of a telehealth visit due to the COVID-19 national emergency and to be honest the patient is somewhat reluctant to come out to be seen for visits which I completely understand. They feel much safer keeping her home as much as possible due to her adequate condition in general. The only issue that she has had recently she did have some wheezing and subsequently chest congestion she was just placed on albuterol along with prednisone at this point. She seems to be doing much better even after 24 hours of the prednisone according to her [name]. Overall they are pleased with how things seem to be progressing. 11/28/2018 upon evaluation today patient is seen by way of telehealth visit since she is still somewhat reluctant to come out due to the COVID-19 pandemic. Subsequently she seems to be doing well and infected I could not obtain actual measurements for the wound the overall appearance of the wound is dramatically improved. She does not appear to have nearly as much space at the 12:00 location and in fact her [name] who is taking care of her mainly states that he is barely able to get anything up into that region is filled and so nicely. Overall  I feel like she is making great progress. 12/12/2018 patient is seen today via a telehealth visit again secondary to her concerns about coming out during the COVID-19 national pandemic. Nonetheless she appears to be doing quite well at this time which is good news. There is no signs of active infection at this time. Overall patient seems to be very pleased as is her son who takes very good care of her. 01/16/2019 on evaluation today patient actually appears to be doing very well with regard to her wound. We have been doing zoom calls quite a bit since the COVID-19 pandemic began just due to the fact that she has been somewhat concerned about coming out.  With that being said she seems to have done very well and in fact the wound is measuring quite a bit smaller today compared to the last measurement. Fortunately there is no signs of active infection at this time. She is also having great improvement overall in her demeanor she is very upbeat and pleased with how things have progressed. 02/21/2019 upon evaluation today patient actually appears to be doing very well with regard to her wound. She has been tolerating the dressing changes her sons been performing these without complication and seems to do an excellent job in my opinion. I am actually very pleased with the Jessica [name], Jessica [name]. ([id number]) overall appearance. The wound is significantly smaller compared to what its been in the past by quite a bit. 03/21/19 on evaluation today patient appears to be doing excellent in regard to her wound. She's been tolerating the dressing changes without complication and there does not appear to be evidence of active infection which is good news. She is still tolerating the Dakinos Moistened gauze packing without complication. 04/18/19 on evaluation today patient is seen by way of a telehealth visit due to her concerns of being exposed to Covid-19 Virus if she comes into the clinic. Subsequently her son is present to help with the visit today. They have been performing the dressing changes specifically Dakins soloution at this point in order to help with granulation in the base of the wound. Fortunately there is no evidence of active infection currently. Her son does note that he feels like maybe some bone in the base of the wound centrally that he is somewhat concerned about. With that being said it's not entirely visible but he states that he can palpate it. 05/25/2019 upon evaluation today patient appears to be doing very well in fact in regard to her wounds. I am very pleased with how things seem to be progressing and though she is not showing any signs of  infection I do believe that it may be time to switch the dressing to something a little different I think she has a lot of irritation in the periwound and this is likely coming some from the fact that she does have a lot of moisture. I think the wound is small enough we can see about initiation of a silver alginate dressing. I think this will help keep the periwound from breaking down. Overall however I am very pleased with how things stand. 06/15/2019 upon evaluation today patient appears to be doing very well with regard to her wound. Overall the periwound also seems to be doing much better. She does have several filament-like projections of her skin on the periwound region that been present for some time unfortunately they keep getting pulled and stuck to the dressing causing this area to bleed. I think we are  getting need to shave/trim these down today to help her to more fully heal in this periwound location so this would not continue to be an issue for her. 07/06/2019 upon evaluation today patient actually appears to be doing excellent in regard to her wound. She is showing signs of improvement the wound is measuring smaller and is looking very well. In general I am extremely happy. The areas that we trimmed off and periwound where she had skin tags last week actually seem to have done excellent they never even bled again. 08/15/2019 upon evaluation today patient appears to actually be doing better with regard to her actual wound. Unfortunately the periwound does not appear to be doing quite as well at this time unfortunately. She had pneumonia over the past couple weeks that is why she did not make it into her appointment at the last visit. Subsequently she because of having pneumonia was not able to lay flat or even on her side as she could not breathe well therefore she spent a lot more time sitting up even sleeping sitting up as opposed to laying on her side. I think this is made some trouble for  her as far as the periwound of her sacral wound is concerned and that is why she is showing signs of the irritation of the skin around. Nonetheless I think this is something that can be managed at this point she now feels better than pneumonia has cleared and overall I believe that she is a much better place in that regard to be able to offload. Electronic Signature(s) Signed: 08/15/2019 11:31:34 AM By: Lenda Kelp PA-C Entered By: Lenda Kelp on 08/15/2019 11:31:34 Jessica [name] ([id number]) -------------------------------------------------------------------------------- Physical Exam Details [name]. Date of Service: 08/15/2019 11:00 AM Medical Record Number: [id number] Patient Account Number: [id number] Date of Birth/Sex: [birth year] (74 y.o. F) Treating RN: Curtis Sites Primary Care Provider: Annita Brod Other Clinician: Referring Provider: Annita Brod Treating Provider/Extender: STONE III, Camilla Skeen Weeks in Treatment: 115 Constitutional Obese and well-hydrated in no acute distress. Respiratory normal breathing without difficulty. Psychiatric this patient is able to make decisions and demonstrates good insight into disease process. Alert and Oriented x 3. pleasant and cooperative. Notes Patient's wound bed currently showed signs again of good granulation there does not appear to be any major issues as far as the wound bed itself is concerned. It is getting smaller and measuring better. When it comes to the periwound obviously this is a little bit bigger deal she was coughing a lot with the pneumonia and so even when she was on her back she was constantly sliding around in the bed due to the forceful coughing. On top of this she also had to sit up more to be able to breathe well because of the pneumonia all this played a role in the wound not looking as good right now as it was in the past fortunately she never ended up having to go to the hospital  her primary care provider manage this at home. Electronic Signature(s) Signed: 08/15/2019 11:32:12 AM By: Lenda Kelp PA-C Entered By: Lenda Kelp on 08/15/2019 11:32:11 Jessica [name] ([id number]) -------------------------------------------------------------------------------- Physician Orders Details [name]. Date of Service: 08/15/2019 11:00 AM Medical Record Number: [id number] Patient Account Number: [id number] Date of Birth/Sex: January 16, 1946 (74 y.o. F) Treating RN: Curtis Sites Primary Care Provider: Annita Brod Other Clinician: Referring Provider: Annita Brod Treating Provider/Extender: STONE III, Yusra Ravert Weeks in Treatment: 115 Verbal /  Phone Orders: No Diagnosis Coding ICD-10 Coding Code Description L89.154 Pressure ulcer of sacral region, stage 4 M48.00 Spinal stenosis, site unspecified R54 Age-related physical debility G89.4 Chronic pain syndrome E78.2 Mixed hyperlipidemia L89.93 Pressure ulcer of unspecified site, stage 3 Wound Cleansing Wound #1 Medial Sacrum o Other: - please cleanse sacral wound with dakins moistened gauze - do not spray dakins on wound Anesthetic (add to Medication List) Wound #1 Medial Sacrum o Topical Lidocaine 4% cream applied to wound bed prior to debridement (In Clinic Only). Skin Barriers/Peri-Wound Care Wound #1 Medial Sacrum o Skin Prep Primary Wound Dressing Wound #1 Medial Sacrum o Alginate - with dry gauze behind alginate to fill space - may use barrier cream and alginate on wet peri-wound areas Secondary Dressing Wound #1 Medial Sacrum o ABD pad - secure with tape Dressing Change Frequency Wound #1 Medial Sacrum o Change dressing twice daily. Follow-up Appointments Wound #1 Medial Sacrum o Return Appointment in 2 weeks. Off-Loading Wound #1 Medial Sacrum o Turn and reposition every 2 hours Additional Orders / Instructions Wound #1 Medial Sacrum o Increase protein  intake. Home Health NEETU, [place] ([id number]) Wound #1 Medial Sacrum o Continue Home Health Visits - [facility] o Home Health Nurse may visit PRN to address patientos wound care needs. o FACE TO FACE ENCOUNTER: MEDICARE and MEDICAID PATIENTS: I certify that this patient is under my care and that I had a face-to-face encounter that meets the physician face-to-face encounter requirements with this patient on this date. The encounter with the patient was in whole or in part for the following MEDICAL CONDITION: (primary reason for Home Healthcare) MEDICAL NECESSITY: I certify, that based on my findings, NURSING services are a medically necessary home health service. HOME BOUND STATUS: I certify that my clinical findings support that this patient is homebound (i.e., Due to illness or injury, pt requires aid of supportive devices such as crutches, cane, wheelchairs, walkers, the use of special transportation or the assistance of another person to leave their place of residence. There is a normal inability to leave the home and doing so requires considerable and taxing effort. Other absences are for medical reasons / religious services and are infrequent or of short duration when for other reasons). o If current dressing causes regression in wound condition, may D/C ordered dressing product/s and apply Normal Saline Moist Dressing daily until next Wound Healing Center / Other MD appointment. Notify Wound Healing Center of regression in wound condition at [phone]. o Please direct any NON-WOUND related issues/requests for orders to patient's Primary Care Physician Electronic Signature(s) Signed: 08/15/2019 4:47:44 PM By: Curtis Sites Signed: 08/15/2019 6:00:40 PM By: Lenda Kelp PA-C Entered By: Curtis Sites on 08/15/2019 11:28:24 Jessica [name] ([id number]) -------------------------------------------------------------------------------- Problem List Details Patient Name:  Jessica [name]. Date of Service: 08/15/2019 11:00 AM Medical Record Number: [id number] Patient Account Number: [id number] Date of Birth/Sex: Jun 22, 1945 (74 y.o. F) Treating RN: Curtis Sites Primary Care Provider: Annita Brod Other Clinician: Referring Provider: Annita Brod Treating Provider/Extender: Linwood Dibbles, Manila Rommel Weeks in Treatment: 115 Active Problems ICD-10 Encounter Code Description Active Date MDM Diagnosis L89.154 Pressure ulcer of sacral region, stage 4 05/27/2017 No Yes M48.00 Spinal stenosis, site unspecified 05/27/2017 No Yes R54 Age-related physical debility 05/27/2017 No Yes G89.4 Chronic pain syndrome 05/27/2017 No Yes E78.2 Mixed hyperlipidemia 05/27/2017 No Yes L89.93 Pressure ulcer of unspecified site, stage 3 05/27/2017 No Yes Inactive Problems Resolved Problems Electronic Signature(s) Signed: 08/15/2019 11:20:35 AM By: Lenda Kelp PA-C Entered By:  Lenda Kelp on 08/15/2019 11:20:34 TAMEY, WANEK ([id number]) -------------------------------------------------------------------------------- Progress Note Details [name], ROSELL. Date of Service: 08/15/2019 11:00 AM Medical Record Number: [id number] Patient Account Number: [id number] Date of Birth/Sex: 1946/02/26 (74 y.o. F) Treating RN: Curtis Sites Primary Care Provider: Annita Brod Other Clinician: Referring Provider: Annita Brod Treating Provider/Extender: Linwood Dibbles, Tomika Eckles Weeks in Treatment: 115 Subjective Chief Complaint Information obtained from Patient she is here for evaluation of a sacral ulcer History of Present Illness (HPI) 05/27/17-she is here in initial evaluation for a left-sided sacral stage IV pressure ulcer and bilateral lower extremity, lateral aspect, unstageable pressure ulcers. She is accompanied by her husband and her son, who are her primary caregivers. She is bedbound secondary to spinal stenosis. According to her son and husband she was hospitalized  from 10/5-11/1 with healthcare associated pneumonia and altered mental status. During her hospitalization she was intubated, extubated on 10/27. She was discharged with Foley catheter and follow up with urology. According to her son and spouse she developed the sacral ulcer during hospitalization. Home health has been applying Santyl daily. She does have a low air loss mattress and is repositioned every 2-3 hours per her family's report. According to the son and spouse she had an appointment with urology on 12/18 and during that appointment developed discoloration to her bilateral lower extremities which ultimately developed into unstageable pressure ulcers to the lateral aspects of her bilateral lower extremities. There has been no topical treatment applied to these. She continues to have home health. There is no concerns expressed regarding dietary intake, stating she eats 3 meals a day, eating was provided; she is supplemented with boost with protein. 06/03/17-she is here in follow-up evaluation for sacral and bilateral lower extremity ulcers. Plain film x-ray done today reveals no distraction to the sacrum or coccyx, no visible abnormalities. Home health has ordered the negative pressure wound system but it has not been initiated. We will continue with Hydrofera Blue until initiation of negative pressure wound system and continue with Santyl to bilateral lower extremity ulcers. Follow-up next week 06/10/17-she is here in follow-up evaluation for sacral and bilateral lower extremity ulcers. The wound VAC will be available tomorrow per home health. We will initiate wound VAC therapy to the sacral ulcer 3 times weekly (Thursday, Saturday/Sunday, Tuesday). We will continue with Santyl to the lower extremity ulcers. The patient's son is checking into home health therapy over the weekend for [facility] changes, with the understanding that if VAC changes cannot be performed over the weekend he will need to  change his mother's appointments to Monday, Wednesday or Friday. The sacral ulcer clinically does not appear infected but there has been a change in the amount of drainage acutely, there is no significant amount of devitalized tissue, there is no malodor. Wound culture was obtained to evaluate for occult infection we will hold off on antibiotic therapy until sensitivities are resulted. 06/18/17 on evaluation today patient appears to be doing fairly well in my opinion although this is the first time I have seen this patient she has been previously evaluated by Tacey Ruiz here in our office. She is going to be switching to Fridays to see me due to the Wound VAC schedule being changed on Monday, Wednesday, and Friday. Subsequently she seems to be doing fairly well with the Wound VAC. Her son who was present during evaluation today states that he somewhat stresses of the Wound VAC in making sure that it was functioning appropriately. With that being  said everything seems to be working well he knows what settings on the [facility] itself to look at and ensure that it is functioning properly. Overall the wound appears to be nice and clean there is no need for debridement today. She has no discomfort in her bilateral lower extremity ulcers also appear to be improving based on measurements and what this honest tell me about the overall appearance. 07/02/17 on evaluation today I noted in patients wound bed that was actually an odor that had not previously been noted. Subsequently there was also a small area of bone that was not previously noted during my last evaluation. This did appear to be necrotic and was being somewhat forced out by the body around the region of granulation. She does not have any pain which is good news. Nonetheless the overall appearance of the ulcer is making me concerned for the patient having developed osteomyelitis. Currently she is not been on any antibiotics and the Wound VAC has been doing fairly  well in general. With that being said I do not think we need to continue the Wound VAC if she potentially has a bone infection. At least not until it is properly addressed with antibiotics. 07/09/17 on evaluation today patient appears to actually be doing very well in regard to her sacral ulcer compared to last week. There is actually no exposed bone at this point. 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 With that being said still I do believe she needs to have a referral to infectious disease due to the osteomyelitis I am gonna recommend that she continue with the doxycycline based on the culture results which showed Eikenella Corrodens as the organism identified that tetracycline should work for. She does not seem to be having any discomfort whatsoever at this point. 07/16/17 on evaluation today patient actually appears to be doing rather well in regard to her sacral wound. I think that the original wound site actually appears much better than previously noted. With that being said she does have a new superficial injury on the right sacral region which appears to be due to according to the sign transport that occurred for her MRI unfortunately. Fortunately this is not too deep and I do think it can be managed but it was a new area that was not previously noted. Otherwise she has been tolerating the Dakin s soaked gauze packing without complication. 07/30/17 on evaluation today patient presents for reevaluation concerning her sacral wound. She has been tolerating the dressing changes without complication. With that being said her wound is doing so well at this point that I think she may be at the point where we could reinitiate the Wound VAC currently and hopefully see good results from this. I'm very pleased with how she has responded to the  [facility] s soaked gauze packing. 08/13/17 evaluation today patient appears to be doing excellent in regard to her lower extremity ulcer this seems to be cleaning up very nicely. In regard to the sacral ulcer the area of trauma on the right lateral portion of the wound actually appears to be much better than previously noted during the last evaluation. The word has definitely filled in and the Wound VAC appears to be helpful I do believe. Overall I'm very pleased with how things seem to be progressing at this time. Patient likewise is also  happy that things are doing well. Jessica [name], Jessica [name] ([id number]) 08/27/17 on evaluation today patient presents for follow-up concerning her ongoing issues with her sacral ulcer and lower extremity ulcer. Fortunately she has been tolerating the dressing changes well complication the Wound VAC in general seems to have done very well up to this point. She does not have any evidence of infection which is good news. She does have some dark discoloration in central portion of the wound which is troublesome for the possibility of pressure injury to the site it sounds like her prior air mattress was not functioning properly her son has just bought a new one for her this is doing much better. Otherwise things seem to be progressing nicely. 09/10/17 on evaluation today patient presents for follow-up concerning her sacral wound and lower extremity ulcer. She has been tolerating the switch of her dressing changes to the Dakin s soaked gauze dressing very well in regard to the sacrum. The left lateral lower extremity ulcer appears to have a new area open just distal to the one that we have been treating with Santyl and this is new since her last visit. There does not appear to be any evidence of infection which is good news. With that being said there's a lot of maceration here at the site and I feel this is likely due to the switch and the dressings it appears that due to the  sticking of the dressings it will switch to utilizing a telfa pad over the Santyl. I think this caused a lot of drainage to be collected and situated in the region just under the dressing which has led to this causing which duration of breakdown. Overall there does not appear to be any significant pain which is good news. Of note I did actually have a conversation with the radiologist who is an interventional radiologist with [facility]. 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 With that being said there does not appear to be any rush to get this done according to the interventional radiologist whom I spoke with. Therefore after discussion it was determined that there gonna wait about six months prior to considering the procedure to get time for hopefully the sacral wound to heal in the interim to at least some degree. 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 According to nursing staff these are all improved, the negative pressure wound therapy system was put on hold last week. We will resume negative pressure wound therapy today, continue with Santyl to the lower extremity and she will follow-up next week. 09/24/17 on evaluation today unfortunately the patient's wound appears to be doing significantly worse compared to last week's evaluation and even my valuation the week before. She actually has bone noted on the left wound margin in the region of undermining unfortunately. This was not noted during the last evaluation. I am concerned about the fact that this seems to be worse not better since our last visit with her. My biggest concern is that she's likely developing a worsening osteomyelitis. This was discussed with patient and her son today during the office visit. 05/03/17 on evaluation today patient actually appears to be doing rather well in regard to her sacral  ulcer compared to last week's evaluation. She has been tolerating the dressing changes without complication. Fortunately even though we're not utilizing the Wound VAC she seems to be making  some strides in seeing the wound area overall improved quite dramatically in my pinion in just one weeks time. She also seems to be staying off of this to the point that I do not see any evidence of new injury which is excellent news. Whatever she's been doing in that regard over the past week I would want her to continue. I did also review the results of her bone culture which revealed group B strep as the responsible organism. Again this is what's causing her osteomyelitis she does have infectious disease appointment scheduled for 10/11/17 10/08/1898 valuation today patient appears to be doing better for the most part in regard to the sacral wound. Overall she is showing signs of having granulation of the bone which is good news. There is an area towards the left of the wound where she does seem to be showing some signs of opening this it would be due to additional pressure to the area unfortunately. There does not appear to be any evidence of infection spreading which is good news that do not see any evidence of significant purulent discharge which is also good news. 10/26/17 on evaluation today patient sacral ulcer actually appears to be very healthy and doing well in regard to the overall appearance of the wound. With that being said she does seem to be having some issues at this point in time with some shear/friction injury of the sacrum from where she was transported to [facility] for her infectious disease appointment. She has been placed on IV antibiotic therapy I will have to go back and find that note for review as I did not have access to that today. Nonetheless I will add the next visit be sure to document the exact antibiotics that she is taking at this point. Nonetheless I do believe the wound is doing  better there's no bone exposure nothing that requires debridement in regard to the sacral wound. Likewise her left lateral lower extremity ulcer also appears to be doing significantly better. In general I feel like things are showing signs of improvement all around. 11/12/17 on evaluation today patient appears to be doing rather well in regard to her sacral wound. In general I do see signs of granulation at this point. Fortunately there does not appear to be any evidence of infection which is good news as well. In general overall happy with the appearance. With that being said I'll be see I would like for this to be progressing more rapidly as with the patient and her son but nonetheless at least things do not appear to be doing worse. Her lower extremity ulcer is doing significantly better. 11/26/17 on evaluation today patient appears to be doing a little better in regard to the sacral wound especially in the peripheral of the wound bed. She actually did have an abrasion on the right gluteal region that's completely resolved to this point. Her lower extremity also appears to be for the most part completely resolved. Overall the sacrum itself seems to be the one thing that is still open and given her trouble. No fevers, chills, nausea, or vomiting noted at this time. She actually has an appointment next week with infectious disease for recheck to see how things are doing. She maybe switch to oral medications at that point. 12/09/17 on evaluation today patient actually appears to be doing much better in regard to her sacral wound. I do feel like she has made good progress at this time as far as healing is concerned. In  fact the wound looks better to me today than it has in quite some time. She is on oral antibiotic therapy and no longer on the IV antibiotics. She did see infectious disease last week. 12/24/17 on evaluation today patient's wounds actually appear to be doing decently well in regard to the  sacral wound in particular. When it comes to her lower extremity ulcers both appear to be completely healed at this point in the one area where she had lived the rash has completely resolved at this time. Overall very pleased with the progress that has been made. Nonetheless the patient seems to be doing well in general. She still does have quite a bit of healing to do in regard to the sacral wound but I feel like she is making progress. 01/07/18 on evaluation today patient actually appears to be doing excellent today regard to her sacral wound. The granular quality in the base of the wound is excellent and shown signs of good improvement there's no evidence of contusion nor deep tissue injury and the periwound actually appears to be doing well. In general I'm very pleased with the overall appearance. 02/04/18 on evaluation today patient's wound actually appears to be doing decently well as far as the granulation is concerned. She has been tolerating the dressing changes without complication. Right now we are still using the Wound VAC. Nonetheless overall there apparently is some cater to the wound and this is concerning simply for the fact that she could be developing a soft tissue infection again. She's had this happen in the past and at that time responded very well to the antibiotics. Nonetheless I don't think I would likely put her on anything prophylactically but rather wait for the results of the culture to return. 02/18/18 on evaluation today patient actually appears to be doing fairly well. She has been tolerating the dressing changes. And the Wound VAC seems to be doing well. Overall I'm very pleased with how things have improved over the past couple weeks since I last saw her. Fortunately there does not appear to be any evidence of overt infection at this time which is good news. She has been taking the antibiotics without Jessica [name], Jessica [name]. ([id number]) complication. Specifically that is  the Bactrim DS which was prescribed on 02/08/18. 03/18/18 on evaluation today patient's wound actually does have quite a bit of odor noted compared to previous. With that being said the wound itself overall does not appear to be too significant or worse. There is no event noted in the wound bed which is good news. With that being said the patient has been worried as the home health nurse stated that she thought she saw bone in the base of the wound. Nonetheless there's a little bit of bruising along the left lateral border of the wound bed which again does not appear to be terrible we have noticed this before but other than this the majority the wound bed appears to be doing excellent in my pinion. I am wondering if there may be some bacterial colonization. This could be leading to some of the odor that we are noting with the Wound VAC. 04/01/18 on evaluation today patient appears to actually be doing well in regard to the overall appearance of the wound itself. She also does not have any odor at this point in regard to the wound surface and I do believe the Bacons is extremely helpful in this regard. With that being said she does have a little bit of  deep tissue injury on the medial aspect of the wound. Again last time it was more lateral. The lateral seems to have improved quite well. Nonetheless she also notes by way of her son that this has been very macerated in the past several weeks since I last saw her. Though things overall seem to be doing better I still think that the Wound VAC may help with managing her fluid better I'm wondering if we can attempt a gauze Wound VAC my hope is that this will be able to manage the moisture control as well as continuing with managing the odor of the wound and bacterial load since the Dakin s has done excellent in helping in that regard. 04/15/17 on evaluation today patient actually appears to be doing much better in regard to the overall appearance of her sacral  wound. I do believe that the gauze Vac has been of benefit for her. Overall she is tolerating this without complication which is also good news. There is no sign of injury to the sacral region at this point. 04/29/18 on evaluation today patient appears to be doing well in regard to her sacral ulcer. She sure signs of good granulation and the Gauze VAC is doing very well for her. Overall I'm pleased in this regard. The biggest issue is that the time from Friday till Monday seems to be quite long for her as far as the amount of time to keep the Wound VAC sealed. For that reason her son wonders if he can take this off on Sunday morning in order to give her day of just wet to dry dressing's with the Dakin's solution I think this would be okay. 05/13/18 on evaluation today patient appears to be doing okay in regard to her sacral ulcer. There does not appear to be any evidence of overt infection at this time which is good news. In general the drainage and moisture appears to be significantly improved this was after the pressure on the Wound VAC was increased after they contacted our office. With that being said a lot of the dark tissue around the edge of the wound has improved in the maceration is significantly better this seems to be managing much better. Fortunately there is no signs of infection at this time. Overall the patient is doing quite well. The wound measurements are not significantly smaller today although they were over the past two visits we will see how things go. 05/27/18 on evaluation today patient's sacral wound actually appears to be doing fairly well at this point. She does have a deep tissue injury yet again on the medial aspect of the wound which is something that we seem to be seeing often. There is some question as to whether or not this is actually being caused by the Wound VAC bunching up and then her somewhat laying on it even though they are attempting to offload this as best as  possible. Patient son states that overall they do the best they can trying offload her and I definitely believe him. I may want to see about putting the Wound VAC on hold for a couple weeks and see if this happens just with standard dressing changes. 06/10/18 on evaluation today patient actually appears to be doing better in regard to her sacral wound. Overall there's no signs of infection, no odor, and no significant skin breakdown around the edge of the wound I feel like this is done much better 50 Dakin s soaked gauze dressing as opposed to  legalizing anything such as the Wound VAC. I believe that we discontinue the Wound VAC as of today. 06/24/18 on evaluation today patient actually appears to be doing well in regard to her sacral ulcer. There's no signs of infection, no odor, and in my pinion no concerns at all. I feel like she is making good progress as far as I'm concerned. 07/12/18 on evaluation today patient appears to be doing very well in regard to her sacral ulcer. She has been tolerating the dressing changes without complication. Fortunately there does not appear to be any evidence of active infection at this time she is doing excellent as far as I'm concerned at this point. 08/11/18 on evaluation today patient's sacral wound actually appears to be doing quite well at this point. The main issue I see is that she continues to have some maceration and skin breakdown due to moisture over the right periwound location unfortunately. We've investigated the possibility of using zinc oxide before but have not utilized it simply due to the fact that she continues to state. Her son anyways that she is allergic to this. They have not tried it recently and may be completely unrelated to the reaction she had before. Nonetheless this may be something you want to attempt will discuss that in the plan. 10/04/18 on evaluation today patient actually appears to be doing quite well in regard to her sacral wound.  It's been roughly 2 months since I last seen her in that time her wound has definitely made progress. This is very slow but again nonetheless does seem to be trending in the correct direction. Overall very pleased in this regard. No fevers, chills, nausea, or vomiting noted at this time. 11/07/2018 on evaluation today patient appears to be doing decently well with regard to her sacral wound based on what I am seeing at this point. She is seen by way of a telehealth visit due to the COVID-19 national emergency and to be honest the patient is somewhat reluctant to come out to be seen for visits which I completely understand. They feel much safer keeping her home as much as possible due to her adequate condition in general. The only issue that she has had recently she did have some wheezing and subsequently chest congestion she was just placed on albuterol along with prednisone at this point. She seems to be doing much better even after 24 hours of the prednisone according to her [name]. Overall they are pleased with how things seem to be progressing. 11/28/2018 upon evaluation today patient is seen by way of telehealth visit since she is still somewhat reluctant to come out due to the COVID-19 pandemic. Subsequently she seems to be doing well and infected I could not obtain actual measurements for the wound the overall appearance of the wound is dramatically improved. She does not appear to have nearly as much space at the 12:00 location and in fact her [name] who is taking care of her mainly states that he is barely able to get anything up into that region is filled and so nicely. Overall I feel like she is making great progress. 12/12/2018 patient is seen today via a telehealth visit again secondary to her concerns about coming out during the COVID-19 national pandemic. Nonetheless she appears to be doing quite well at this time which is good news. There is no signs of active infection at this  time. Overall patient seems to be very pleased as is her son who takes  very good care of her. 01/16/2019 on evaluation today patient actually appears to be doing very well with regard to her wound. We have been doing zoom calls quite a bit since the COVID-19 pandemic began just due to the fact that she has been somewhat concerned about coming out. With that being said she seems to have done very well and in fact the wound is measuring quite a bit smaller today compared to the last measurement. Fortunately there Jessica BradyOVERMAN, [name] ([id number]) is no signs of active infection at this time. She is also having great improvement overall in her demeanor she is very upbeat and pleased with how things have progressed. 02/21/2019 upon evaluation today patient actually appears to be doing very well with regard to her wound. She has been tolerating the dressing changes her sons been performing these without complication and seems to do an excellent job in my opinion. I am actually very pleased with the overall appearance. The wound is significantly smaller compared to what its been in the past by quite a bit. 03/21/19 on evaluation today patient appears to be doing excellent in regard to her wound. She's been tolerating the dressing changes without complication and there does not appear to be evidence of active infection which is good news. She is still tolerating the Dakin s Moistened gauze packing without complication. 04/18/19 on evaluation today patient is seen by way of a telehealth visit due to her concerns of being exposed to Covid-19 Virus if she comes into the clinic. Subsequently her son is present to help with the visit today. They have been performing the dressing changes specifically Dakins soloution at this point in order to help with granulation in the base of the wound. Fortunately there is no evidence of active infection currently. Her son does note that he feels like maybe some bone in the base  of the wound centrally that he is somewhat concerned about. With that being said it's not entirely visible but he states that he can palpate it. 05/25/2019 upon evaluation today patient appears to be doing very well in fact in regard to her wounds. I am very pleased with how things seem to be progressing and though she is not showing any signs of infection I do believe that it may be time to switch the dressing to something a little different I think she has a lot of irritation in the periwound and this is likely coming some from the fact that she does have a lot of moisture. I think the wound is small enough we can see about initiation of a silver alginate dressing. I think this will help keep the periwound from breaking down. Overall however I am very pleased with how things stand. 06/15/2019 upon evaluation today patient appears to be doing very well with regard to her wound. Overall the periwound also seems to be doing much better. She does have several filament-like projections of her skin on the periwound region that been present for some time unfortunately they keep getting pulled and stuck to the dressing causing this area to bleed. I think we are getting need to shave/trim these down today to help her to more fully heal in this periwound location so this would not continue to be an issue for her. 07/06/2019 upon evaluation today patient actually appears to be doing excellent in regard to her wound. She is showing signs of improvement the wound is measuring smaller and is looking very well. In general I am extremely happy. The  areas that we trimmed off and periwound where she had skin tags last week actually seem to have done excellent they never even bled again. 08/15/2019 upon evaluation today patient appears to actually be doing better with regard to her actual wound. Unfortunately the periwound does not appear to be doing quite as well at this time unfortunately. She had pneumonia over the past  couple weeks that is why she did not make it into her appointment at the last visit. Subsequently she because of having pneumonia was not able to lay flat or even on her side as she could not breathe well therefore she spent a lot more time sitting up even sleeping sitting up as opposed to laying on her side. I think this is made some trouble for her as far as the periwound of her sacral wound is concerned and that is why she is showing signs of the irritation of the skin around. Nonetheless I think this is something that can be managed at this point she now feels better than pneumonia has cleared and overall I believe that she is a much better place in that regard to be able to offload. Objective Constitutional Obese and well-hydrated in no acute distress. Vitals Time Taken: 11:12 AM, Height: 70 in, Temperature: 98.4 F, Pulse: 100 bpm, Respiratory Rate: 16 breaths/min, Blood Pressure: 150/80 mmHg. Respiratory normal breathing without difficulty. Psychiatric this patient is able to make decisions and demonstrates good insight into disease process. Alert and Oriented x 3. pleasant and cooperative. General Notes: Patient's wound bed currently showed signs again of good granulation there does not appear to be any major issues as far as the wound bed itself is concerned. It is getting smaller and measuring better. When it comes to the periwound obviously this is a little bit bigger deal she was coughing a lot with the pneumonia and so even when she was on her back she was constantly sliding around in the bed due to the forceful coughing. On top of this she also had to sit up more to be able to breathe well because of the pneumonia all this played a role in the wound not looking as good right now as it was in the past fortunately she never ended up having to go to the hospital her primary care provider manage this at home. Integumentary (Hair, Skin) Wound #1 status is Open. Original cause of wound  was Pressure Injury. The wound is located on the Medial Sacrum. The wound measures 5.4cm length x 2.7cm width x 2cm depth; 11.451cm^2 area and 22.902cm^3 volume. There is Fat Layer (Subcutaneous Tissue) Exposed exposed. There is a medium amount of sanguinous drainage noted. Foul odor after cleansing was noted. The wound margin is distinct with the outline attached to the wound base. There is large (67-100%) red, pink granulation within the wound bed. There is a small (1-33%) amount of necrotic tissue within the wound bed including Adherent Slough. Jessica [name], Jessica [name] ([id number]) Assessment Active Problems ICD-10 Pressure ulcer of sacral region, stage 4 Spinal stenosis, site unspecified Age-related physical debility Chronic pain syndrome Mixed hyperlipidemia Pressure ulcer of unspecified site, stage 3 Plan Wound Cleansing: Wound #1 Medial Sacrum: Other: - please cleanse sacral wound with dakins moistened gauze - do not spray dakins on wound Anesthetic (add to Medication List): Wound #1 Medial Sacrum: Topical Lidocaine 4% cream applied to wound bed prior to debridement (In Clinic Only). Skin Barriers/Peri-Wound Care: Wound #1 Medial Sacrum: Skin Prep Primary Wound Dressing: Wound #1 Medial Sacrum:  Alginate - with dry gauze behind alginate to fill space - may use barrier cream and alginate on wet peri-wound areas Secondary Dressing: Wound #1 Medial Sacrum: ABD pad - secure with tape Dressing Change Frequency: Wound #1 Medial Sacrum: Change dressing twice daily. Follow-up Appointments: Wound #1 Medial Sacrum: Return Appointment in 2 weeks. Off-Loading: Wound #1 Medial Sacrum: Turn and reposition every 2 hours Additional Orders / Instructions: Wound #1 Medial Sacrum: Increase protein intake. Home Health: Wound #1 Medial Sacrum: Continue Home Health Visits - [facility] Nurse may visit PRN to address patient s wound care needs. FACE TO FACE ENCOUNTER: MEDICARE and  MEDICAID PATIENTS: I certify that this patient is under my care and that I had a face-to-face encounter that meets the physician face-to-face encounter requirements with this patient on this date. The encounter with the patient was in whole or in part for the following MEDICAL CONDITION: (primary reason for Home Healthcare) MEDICAL NECESSITY: I certify, that based on my findings, NURSING services are a medically necessary home health service. HOME BOUND STATUS: I certify that my clinical findings support that this patient is homebound (i.e., Due to illness or injury, pt requires aid of supportive devices such as crutches, cane, wheelchairs, walkers, the use of special transportation or the assistance of another person to leave their place of residence. There is a normal inability to leave the home and doing so requires considerable and taxing effort. Other absences are for medical reasons / religious services and are infrequent or of short duration when for other reasons). If current dressing causes regression in wound condition, may D/C ordered dressing product/s and apply Normal Saline Moist Dressing daily until next Wound Healing Center / Other MD appointment. Notify Wound Healing Center of regression in wound condition at [phone]. Please direct any NON-WOUND related issues/requests for orders to patient's Primary Care Physician 1. I would recommend currently that we continue with the plain alginate dressing I think this is still doing well they will continue to use the Desitin around the wound and the periwound region and we can also put some alginate over this area as well. Obviously if the patient is doing well in 2 weeks time we may not need alginate around at this location in the future. 2. With regard to offloading she is a much better place now to be able to appropriately offload that is good news and I would recommend that we going to continue with appropriate offloading every 2  hours. 3. I am also going to suggest at this point that the patient continue to monitor for any signs of worsening her son will actually do this and he does not excellent job keeping a very close and careful eye on her. Jessica [name], Jessica [name] ([id number]) We will see patient back for reevaluation in 2 weeks here in the clinic. If anything worsens or changes patient will contact our office for additional recommendations. I want to be sure that things improve and that nothing seems to be worsening. If she is doing well that time we can go back to her normal follow-up regimen. Electronic Signature(s) Signed: 08/15/2019 11:33:20 AM By: Lenda Kelp PA-C Entered By: Lenda Kelp on 08/15/2019 11:33:20 Jessica [name] ([id number]) -------------------------------------------------------------------------------- SuperBill Details [name]. Date of Service: 08/15/2019 Medical Record Number: [id number] Patient Account Number: [id number] Date of Birth/Sex: [birth year] (74 y.o. F) Treating RN: Curtis Sites Primary Care Provider: Annita Brod Other Clinician: Referring Provider: Annita Brod Treating Provider/Extender: STONE III, Lashanda Storlie  Weeks in Treatment: 115 Diagnosis Coding ICD-10 Codes Code Description L89.154 Pressure ulcer of sacral region, stage 4 M48.00 Spinal stenosis, site unspecified R54 Age-related physical debility G89.4 Chronic pain syndrome E78.2 Mixed hyperlipidemia L89.93 Pressure ulcer of unspecified site, stage 3 Facility Procedures CPT4 Code: [id number] Description: 99213 - WOUND CARE VISIT-LEV 3 EST PT Modifier: Quantity: 1 Physician Procedures CPT4 Code: [id number] Description: 99213 - WC PHYS LEVEL 3 - EST PT Modifier: Quantity: 1 CPT4 Code: Description: ICD-10 Diagnosis Description L89.154 Pressure ulcer of sacral region, stage 4 M48.00 Spinal stenosis, site unspecified R54 Age-related physical debility G89.4 Chronic pain  syndrome Modifier: Quantity: Electronic Signature(s) Signed: 08/15/2019 11:33:32 AM By: Lenda Kelp PA-C Entered By: Lenda Kelp on 08/15/2019 11:33:31

## 2019-08-29 ENCOUNTER — Ambulatory Visit: Payer: Medicare Other | Admitting: Physician Assistant

## 2019-09-14 ENCOUNTER — Ambulatory Visit: Payer: Medicare Other | Admitting: Physician Assistant

## 2019-09-19 ENCOUNTER — Ambulatory Visit: Payer: Medicare Other | Admitting: Physician Assistant

## 2019-09-28 ENCOUNTER — Telehealth: Payer: Self-pay | Admitting: *Deleted

## 2019-09-28 NOTE — Telephone Encounter (Signed)
Jenny-Homehealth nurse called regarding UTI symptoms. Fever and urine odor-wanted to do a UA and urine culture-verbal order ok per Dr. Richardo Hanks. Advised patient needs to follow up in office according to last note.

## 2019-09-29 ENCOUNTER — Other Ambulatory Visit: Payer: Self-pay | Admitting: Urology

## 2019-09-29 ENCOUNTER — Other Ambulatory Visit: Payer: Self-pay

## 2019-09-29 ENCOUNTER — Emergency Department: Payer: Medicare Other

## 2019-09-29 ENCOUNTER — Encounter: Payer: Self-pay | Admitting: Emergency Medicine

## 2019-09-29 ENCOUNTER — Inpatient Hospital Stay
Admission: EM | Admit: 2019-09-29 | Discharge: 2019-10-05 | DRG: 698 | Disposition: A | Discharge status: Home Health | Payer: Medicare Other | Attending: Internal Medicine | Admitting: Internal Medicine

## 2019-09-29 ENCOUNTER — Ambulatory Visit: Payer: Medicare Other | Admitting: Physician Assistant

## 2019-09-29 DIAGNOSIS — E43 Unspecified severe protein-calorie malnutrition: Secondary | ICD-10-CM | POA: Diagnosis present

## 2019-09-29 DIAGNOSIS — E162 Hypoglycemia, unspecified: Secondary | ICD-10-CM | POA: Diagnosis not present

## 2019-09-29 DIAGNOSIS — I110 Hypertensive heart disease with heart failure: Secondary | ICD-10-CM | POA: Diagnosis present

## 2019-09-29 DIAGNOSIS — G9341 Metabolic encephalopathy: Secondary | ICD-10-CM | POA: Diagnosis present

## 2019-09-29 DIAGNOSIS — A419 Sepsis, unspecified organism: Secondary | ICD-10-CM

## 2019-09-29 DIAGNOSIS — Z20822 Contact with and (suspected) exposure to covid-19: Secondary | ICD-10-CM | POA: Diagnosis present

## 2019-09-29 DIAGNOSIS — Z79899 Other long term (current) drug therapy: Secondary | ICD-10-CM

## 2019-09-29 DIAGNOSIS — Z8619 Personal history of other infectious and parasitic diseases: Secondary | ICD-10-CM

## 2019-09-29 DIAGNOSIS — R6521 Severe sepsis with septic shock: Secondary | ICD-10-CM | POA: Diagnosis present

## 2019-09-29 DIAGNOSIS — E872 Acidosis: Secondary | ICD-10-CM | POA: Diagnosis present

## 2019-09-29 DIAGNOSIS — E785 Hyperlipidemia, unspecified: Secondary | ICD-10-CM | POA: Diagnosis present

## 2019-09-29 DIAGNOSIS — A4151 Sepsis due to Escherichia coli [E. coli]: Secondary | ICD-10-CM | POA: Diagnosis present

## 2019-09-29 DIAGNOSIS — Z87440 Personal history of urinary (tract) infections: Secondary | ICD-10-CM

## 2019-09-29 DIAGNOSIS — Z8249 Family history of ischemic heart disease and other diseases of the circulatory system: Secondary | ICD-10-CM

## 2019-09-29 DIAGNOSIS — J9621 Acute and chronic respiratory failure with hypoxia: Secondary | ICD-10-CM | POA: Diagnosis not present

## 2019-09-29 DIAGNOSIS — R7989 Other specified abnormal findings of blood chemistry: Secondary | ICD-10-CM

## 2019-09-29 DIAGNOSIS — E039 Hypothyroidism, unspecified: Secondary | ICD-10-CM | POA: Diagnosis present

## 2019-09-29 DIAGNOSIS — K7460 Unspecified cirrhosis of liver: Secondary | ICD-10-CM

## 2019-09-29 DIAGNOSIS — G8929 Other chronic pain: Secondary | ICD-10-CM | POA: Diagnosis present

## 2019-09-29 DIAGNOSIS — L89154 Pressure ulcer of sacral region, stage 4: Secondary | ICD-10-CM | POA: Diagnosis present

## 2019-09-29 DIAGNOSIS — M4800 Spinal stenosis, site unspecified: Secondary | ICD-10-CM | POA: Diagnosis present

## 2019-09-29 DIAGNOSIS — E876 Hypokalemia: Secondary | ICD-10-CM | POA: Diagnosis present

## 2019-09-29 DIAGNOSIS — Z87891 Personal history of nicotine dependence: Secondary | ICD-10-CM

## 2019-09-29 DIAGNOSIS — Y846 Urinary catheterization as the cause of abnormal reaction of the patient, or of later complication, without mention of misadventure at the time of the procedure: Secondary | ICD-10-CM | POA: Diagnosis present

## 2019-09-29 DIAGNOSIS — I5033 Acute on chronic diastolic (congestive) heart failure: Secondary | ICD-10-CM | POA: Diagnosis not present

## 2019-09-29 DIAGNOSIS — J9811 Atelectasis: Secondary | ICD-10-CM | POA: Diagnosis present

## 2019-09-29 DIAGNOSIS — I5031 Acute diastolic (congestive) heart failure: Secondary | ICD-10-CM | POA: Diagnosis not present

## 2019-09-29 DIAGNOSIS — Z9109 Other allergy status, other than to drugs and biological substances: Secondary | ICD-10-CM

## 2019-09-29 DIAGNOSIS — R441 Visual hallucinations: Secondary | ICD-10-CM | POA: Diagnosis present

## 2019-09-29 DIAGNOSIS — Z7401 Bed confinement status: Secondary | ICD-10-CM

## 2019-09-29 DIAGNOSIS — T83511A Infection and inflammatory reaction due to indwelling urethral catheter, initial encounter: Principal | ICD-10-CM | POA: Diagnosis present

## 2019-09-29 DIAGNOSIS — Z90710 Acquired absence of both cervix and uterus: Secondary | ICD-10-CM

## 2019-09-29 DIAGNOSIS — N179 Acute kidney failure, unspecified: Secondary | ICD-10-CM

## 2019-09-29 DIAGNOSIS — J9601 Acute respiratory failure with hypoxia: Secondary | ICD-10-CM

## 2019-09-29 DIAGNOSIS — Z79890 Hormone replacement therapy (postmenopausal): Secondary | ICD-10-CM

## 2019-09-29 DIAGNOSIS — R609 Edema, unspecified: Secondary | ICD-10-CM

## 2019-09-29 DIAGNOSIS — L89892 Pressure ulcer of other site, stage 2: Secondary | ICD-10-CM | POA: Diagnosis present

## 2019-09-29 DIAGNOSIS — R9389 Abnormal findings on diagnostic imaging of other specified body structures: Secondary | ICD-10-CM

## 2019-09-29 DIAGNOSIS — E871 Hypo-osmolality and hyponatremia: Secondary | ICD-10-CM | POA: Diagnosis present

## 2019-09-29 DIAGNOSIS — R0603 Acute respiratory distress: Secondary | ICD-10-CM

## 2019-09-29 DIAGNOSIS — R4182 Altered mental status, unspecified: Secondary | ICD-10-CM

## 2019-09-29 DIAGNOSIS — N390 Urinary tract infection, site not specified: Secondary | ICD-10-CM | POA: Diagnosis not present

## 2019-09-29 DIAGNOSIS — L8990 Pressure ulcer of unspecified site, unspecified stage: Secondary | ICD-10-CM | POA: Insufficient documentation

## 2019-09-29 DIAGNOSIS — Z7982 Long term (current) use of aspirin: Secondary | ICD-10-CM

## 2019-09-29 DIAGNOSIS — R1010 Upper abdominal pain, unspecified: Secondary | ICD-10-CM

## 2019-09-29 DIAGNOSIS — Z6830 Body mass index (BMI) 30.0-30.9, adult: Secondary | ICD-10-CM

## 2019-09-29 LAB — URINALYSIS, COMPLETE (UACMP) WITH MICROSCOPIC
Bilirubin Urine: NEGATIVE
Glucose, UA: NEGATIVE mg/dL
Hgb urine dipstick: NEGATIVE
Ketones, ur: NEGATIVE mg/dL
Nitrite: POSITIVE — AB
Protein, ur: 100 mg/dL — AB
Specific Gravity, Urine: 1.014 (ref 1.005–1.030)
WBC, UA: >50 WBC/hpf — ABNORMAL HIGH (ref 0–5)
pH: 5 (ref 5.0–8.0)

## 2019-09-29 LAB — MRSA PCR SCREENING: MRSA by PCR: NEGATIVE

## 2019-09-29 LAB — COMPREHENSIVE METABOLIC PANEL
ALT: 28 U/L (ref 0–44)
AST: 54 U/L — ABNORMAL HIGH (ref 15–41)
Albumin: 1.6 g/dL — ABNORMAL LOW (ref 3.5–5.0)
Alkaline Phosphatase: 174 U/L — ABNORMAL HIGH (ref 38–126)
Anion gap: 14 (ref 5–15)
BUN: 14 mg/dL (ref 8–23)
CO2: 28 mmol/L (ref 22–32)
Calcium: 7.8 mg/dL — ABNORMAL LOW (ref 8.9–10.3)
Chloride: 89 mmol/L — ABNORMAL LOW (ref 98–111)
Creatinine, Ser: 1.1 mg/dL — ABNORMAL HIGH (ref 0.44–1.00)
GFR calc Af Amer: 58 mL/min — ABNORMAL LOW (ref >60)
GFR calc non Af Amer: 50 mL/min — ABNORMAL LOW (ref >60)
Glucose, Bld: 108 mg/dL — ABNORMAL HIGH (ref 70–99)
Potassium: 3.4 mmol/L — ABNORMAL LOW (ref 3.5–5.1)
Sodium: 131 mmol/L — ABNORMAL LOW (ref 135–145)
Total Bilirubin: 1.8 mg/dL — ABNORMAL HIGH (ref 0.3–1.2)
Total Protein: 5.2 g/dL — ABNORMAL LOW (ref 6.5–8.1)

## 2019-09-29 LAB — CBC WITH DIFFERENTIAL/PLATELET
Abs Immature Granulocytes: 0.2 10*3/uL — ABNORMAL HIGH (ref 0.00–0.07)
Basophils Absolute: 0.1 10*3/uL (ref 0.0–0.1)
Basophils Relative: 0 %
Eosinophils Absolute: 0 10*3/uL (ref 0.0–0.5)
Eosinophils Relative: 0 %
HCT: 36.2 % (ref 36.0–46.0)
Hemoglobin: 12.3 g/dL (ref 12.0–15.0)
Immature Granulocytes: 1 %
Lymphocytes Relative: 11 %
Lymphs Abs: 2.4 10*3/uL (ref 0.7–4.0)
MCH: 27.7 pg (ref 26.0–34.0)
MCHC: 34 g/dL (ref 30.0–36.0)
MCV: 81.5 fL (ref 80.0–100.0)
Monocytes Absolute: 1.7 10*3/uL — ABNORMAL HIGH (ref 0.1–1.0)
Monocytes Relative: 8 %
Neutro Abs: 17.9 10*3/uL — ABNORMAL HIGH (ref 1.7–7.7)
Neutrophils Relative %: 80 %
Platelets: 276 10*3/uL (ref 150–400)
RBC: 4.44 MIL/uL (ref 3.87–5.11)
RDW: 18.6 % — ABNORMAL HIGH (ref 11.5–15.5)
WBC: 22.3 10*3/uL — ABNORMAL HIGH (ref 4.0–10.5)
nRBC: 0 % (ref 0.0–0.2)

## 2019-09-29 LAB — CORTISOL: Cortisol, Plasma: 14.4 ug/dL

## 2019-09-29 LAB — SARS CORONAVIRUS 2 BY RT PCR (HOSPITAL ORDER, PERFORMED IN ~~LOC~~ HOSPITAL LAB): SARS Coronavirus 2: NEGATIVE

## 2019-09-29 LAB — LACTIC ACID, PLASMA
Lactic Acid, Venous: 3.2 mmol/L (ref 0.5–1.9)
Lactic Acid, Venous: 3.2 mmol/L (ref 0.5–1.9)

## 2019-09-29 LAB — GLUCOSE, CAPILLARY
Glucose-Capillary: 130 mg/dL — ABNORMAL HIGH (ref 70–99)
Glucose-Capillary: 49 mg/dL — ABNORMAL LOW (ref 70–99)

## 2019-09-29 LAB — PROTIME-INR
INR: 1.4 — ABNORMAL HIGH (ref 0.8–1.2)
Prothrombin Time: 16.7 seconds — ABNORMAL HIGH (ref 11.4–15.2)

## 2019-09-29 LAB — TSH: TSH: 4.196 u[IU]/mL (ref 0.350–4.500)

## 2019-09-29 LAB — T4, FREE: Free T4: 1.35 ng/dL — ABNORMAL HIGH (ref 0.61–1.12)

## 2019-09-29 MED ORDER — ONDANSETRON HCL 4 MG/2ML IJ SOLN: 4.0000 mg | Freq: Four times a day (QID) | INTRAMUSCULAR | Status: DC | PRN

## 2019-09-29 MED ORDER — SODIUM CHLORIDE 0.9 % IV SOLN
2.0000 g | Freq: Once | INTRAVENOUS | Status: AC
  Administered 2019-09-29: 2 g via INTRAVENOUS
  Filled 2019-09-29: qty 2

## 2019-09-29 MED ORDER — METRONIDAZOLE IN NACL 5-0.79 MG/ML-% IV SOLN: 500.0000 mg | Freq: Three times a day (TID) | INTRAVENOUS | Status: DC

## 2019-09-29 MED ORDER — VITAMIN B-12 1000 MCG PO TABS
1000.0000 ug | ORAL_TABLET | Freq: Every day | ORAL | Status: DC
  Administered 2019-09-30 – 2019-10-05 (×6): 1000 ug via ORAL
  Filled 2019-09-29 (×7): qty 1

## 2019-09-29 MED ORDER — LACTATED RINGERS IV BOLUS (SEPSIS)
1000.0000 mL | Freq: Once | INTRAVENOUS | Status: AC
  Administered 2019-09-29: 1000 mL via INTRAVENOUS

## 2019-09-29 MED ORDER — VANCOMYCIN HCL 750 MG/150ML IV SOLN
750.0000 mg | Freq: Two times a day (BID) | INTRAVENOUS | Status: DC
  Filled 2019-09-29: qty 150

## 2019-09-29 MED ORDER — NOREPINEPHRINE 4 MG/250ML-% IV SOLN
2.0000 ug/min | INTRAVENOUS | Status: DC
  Administered 2019-09-29: 3 ug/min via INTRAVENOUS
  Administered 2019-09-30: 5 ug/min via INTRAVENOUS
  Filled 2019-09-29: qty 250

## 2019-09-29 MED ORDER — POLYETHYLENE GLYCOL 3350 17 G PO PACK: 17.0000 g | PACK | Freq: Every day | ORAL | Status: DC | PRN

## 2019-09-29 MED ORDER — ALBUMIN HUMAN 25 % IV SOLN
25.0000 g | Freq: Two times a day (BID) | INTRAVENOUS | Status: AC
  Administered 2019-09-29 – 2019-10-01 (×5): 25 g via INTRAVENOUS
  Filled 2019-09-29 (×3): qty 100

## 2019-09-29 MED ORDER — DOCUSATE SODIUM 100 MG PO CAPS
100.0000 mg | ORAL_CAPSULE | Freq: Two times a day (BID) | ORAL | Status: DC | PRN
  Administered 2019-10-03: 100 mg via ORAL
  Filled 2019-09-29 (×2): qty 1

## 2019-09-29 MED ORDER — VANCOMYCIN HCL IN DEXTROSE 1-5 GM/200ML-% IV SOLN: 1000.0000 mg | Freq: Once | INTRAVENOUS | Status: DC

## 2019-09-29 MED ORDER — LEVOTHYROXINE SODIUM 50 MCG PO TABS
150.0000 ug | ORAL_TABLET | Freq: Every day | ORAL | Status: DC
  Administered 2019-10-01 – 2019-10-05 (×4): 150 ug via ORAL
  Filled 2019-09-29 (×4): qty 1

## 2019-09-29 MED ORDER — SODIUM CHLORIDE 0.9 % IV SOLN
1.0000 g | Freq: Three times a day (TID) | INTRAVENOUS | Status: DC
  Administered 2019-09-30 – 2019-10-02 (×8): 1 g via INTRAVENOUS
  Filled 2019-09-29 (×10): qty 1

## 2019-09-29 MED ORDER — PANTOPRAZOLE SODIUM 40 MG IV SOLR
40.0000 mg | Freq: Every day | INTRAVENOUS | Status: DC
  Administered 2019-09-29 – 2019-10-04 (×6): 40 mg via INTRAVENOUS
  Filled 2019-09-29 (×6): qty 40

## 2019-09-29 MED ORDER — VANCOMYCIN HCL 1500 MG/300ML IV SOLN
1500.0000 mg | Freq: Once | INTRAVENOUS | Status: AC
  Administered 2019-09-29: 1500 mg via INTRAVENOUS
  Filled 2019-09-29: qty 300

## 2019-09-29 MED ORDER — BACLOFEN 10 MG PO TABS
5.0000 mg | ORAL_TABLET | Freq: Three times a day (TID) | ORAL | Status: DC
  Administered 2019-09-30 – 2019-10-05 (×15): 5 mg via ORAL
  Filled 2019-09-29 (×20): qty 0.5

## 2019-09-29 MED ORDER — METRONIDAZOLE IN NACL 5-0.79 MG/ML-% IV SOLN
500.0000 mg | Freq: Once | INTRAVENOUS | Status: AC
  Administered 2019-09-29: 500 mg via INTRAVENOUS
  Filled 2019-09-29: qty 100

## 2019-09-29 MED ORDER — NOREPINEPHRINE 4 MG/250ML-% IV SOLN
0.0000 ug/min | INTRAVENOUS | Status: DC
  Administered 2019-09-29: 2 ug/min via INTRAVENOUS
  Filled 2019-09-29: qty 250

## 2019-09-29 MED ORDER — HEPARIN SODIUM (PORCINE) 5000 UNIT/ML IJ SOLN
5000.0000 [IU] | Freq: Two times a day (BID) | INTRAMUSCULAR | Status: DC
  Administered 2019-09-29 – 2019-10-05 (×12): 5000 [IU] via SUBCUTANEOUS
  Filled 2019-09-29 (×12): qty 1

## 2019-09-29 MED ORDER — CHLORHEXIDINE GLUCONATE CLOTH 2 % EX PADS
6.0000 | MEDICATED_PAD | Freq: Every day | CUTANEOUS | Status: DC
  Administered 2019-09-30 – 2019-10-05 (×6): 6 via TOPICAL

## 2019-09-29 MED ORDER — ACETAMINOPHEN 325 MG PO TABS: 650.0000 mg | ORAL_TABLET | ORAL | Status: DC | PRN

## 2019-09-29 MED ORDER — SODIUM CHLORIDE 0.9 % IV SOLN: 2.0000 g | Freq: Two times a day (BID) | INTRAVENOUS | Status: DC

## 2019-09-29 MED ORDER — ARIPIPRAZOLE 2 MG PO TABS
2.0000 mg | ORAL_TABLET | Freq: Every day | ORAL | Status: DC
  Administered 2019-09-30 – 2019-10-02 (×3): 2 mg via ORAL
  Filled 2019-09-29 (×4): qty 1

## 2019-09-29 MED ORDER — LACTATED RINGERS IV SOLN
INTRAVENOUS | Status: DC
  Administered 2019-09-29: 75 mL/h via INTRAVENOUS

## 2019-09-29 MED ORDER — SODIUM CHLORIDE 0.9 % IV SOLN
1.0000 g | Freq: Three times a day (TID) | INTRAVENOUS | Status: DC
  Administered 2019-09-29: 1 g via INTRAVENOUS
  Filled 2019-09-29 (×4): qty 1

## 2019-09-29 MED ORDER — SODIUM CHLORIDE 0.9 % IV SOLN
250.0000 mL | INTRAVENOUS | Status: DC
  Administered 2019-09-29 – 2019-10-01 (×3): 250 mL via INTRAVENOUS

## 2019-09-29 MED ORDER — ALBUMIN HUMAN 25 % IV SOLN
25.0000 g | Freq: Two times a day (BID) | INTRAVENOUS | Status: DC
  Filled 2019-09-29 (×3): qty 100

## 2019-09-29 MED ORDER — PREGABALIN 50 MG PO CAPS
100.0000 mg | ORAL_CAPSULE | Freq: Three times a day (TID) | ORAL | Status: DC
  Administered 2019-09-30 – 2019-10-05 (×15): 100 mg via ORAL
  Filled 2019-09-29 (×16): qty 2

## 2019-09-29 MED ORDER — LACTATED RINGERS IV BOLUS (SEPSIS)
500.0000 mL | Freq: Once | INTRAVENOUS | Status: AC
  Administered 2019-09-29: 500 mL via INTRAVENOUS

## 2019-09-29 NOTE — ED Triage Notes (Signed)
Pt to ED via [facility] from home for altered mental status. Pt was D/C recently for UTI. Pt was supposed to see wound clinic today Pt has not been eating or drinking the past few days. Pt had fever a few days ago at home. This morning pt is altered.

## 2019-09-29 NOTE — Progress Notes (Signed)
Ch visited with [name] as part of routine rounding. [name] Jessica [name] asked if he could talk with Ch and spoke about his PTSD, Depression, and clash with EMS staff and Security at ED today. Jessica [name] was tearful saying his mom is all he's got. Ch had a long conversation listening to his concerns. Ch prayed with Pt and Jessica [name] before leaving.   15:22   Ch escorted Pt's husband Rayna Sexton, and [name] Jessica [name] from Registration area to ICU.

## 2019-09-29 NOTE — Consult Note (Signed)
PHARMACY -  BRIEF ANTIBIOTIC NOTE   Pharmacy has received consult(s) for vancomycin and cefepime from an ED provider.  The patient's profile has been reviewed for ht/wt/allergies/indication/available labs.    One time order(s) placed for:    1) vancomycin 1500 mg IV x 1  2) cefepime 2 grams IV x 1  Further antibiotics/pharmacy consults should be ordered by admitting physician if indicated.                       Thank you, Lowella Bandy 09/29/2019  8:48 AM

## 2019-09-29 NOTE — Progress Notes (Signed)
Pharmacy Antibiotic Note  Jessica [name] is a 74 y.o. female admitted on 09/29/2019. Pharmacy has been consulted for vancomycin and cefepime dosing.  Plan: Vanc 1500 mg IV x 1 followed by 750 mg IV q12h  Cefepime 2 g IV q12h  Height: 5\' 10"  (177.8 cm) Weight: 81.6 kg (179 lb 14.3 oz) IBW/kg (Calculated) : 68.5  Temp (24hrs), Avg:98.3 F (36.8 C), Min:98.3 F (36.8 C), Max:98.3 F (36.8 C)  Recent Labs  Lab 09/29/19 0840 09/29/19 0841 09/29/19 1118  WBC 22.3*  --   --   CREATININE 1.10*  --   --   LATICACIDVEN  --  3.2* 3.2*    Estimated Creatinine Clearance: 50 mL/min (A) (by C-G formula based on SCr of 1.1 mg/dL (H)).    Allergies  Allergen Reactions   Ambien [Zolpidem Tartrate] Other (See Comments)    Reaction: altered mental status   Zinc Other (See Comments)    Zinc ointments cause skin irritation    Antimicrobials this admission: Vancomycin 6/18 >> Cefepime 6/18 >>   Microbiology results: 6/18 BCx: pending 6/18 UCx: pending    Thank you for allowing pharmacy to be a part of this patients care.  7/18, PharmD 09/29/2019 2:04 PM

## 2019-09-29 NOTE — ED Provider Notes (Signed)
[facility] Emergency Department Provider Note   ____________________________________________   None    (approximate)  I have reviewed the triage vital signs and the nursing notes.   HISTORY  Chief Complaint Altered Mental Status    HPI Jessica [name] is a 74 y.o. female with past medical history of hypertension, hyperlipidemia, CHF, and hypothyroidism who presents to the ED for altered mental status.  Patient is bedbound at baseline secondary to spinal stenosis, history is also limited due to patient's altered mental status.  Per son, who cares for the patient at home, patient has been less alert ever since he went to check on her this morning.  Son states that she has been very sluggish to respond, but when she does wake up is able to answer questions appropriately.  He states that her appetite has also diminished throughout this week and he has found her urine to be dark and cloudy.  She had a "low-grade fever" when visited by home health earlier in the week, but son denies any recent fevers.  She has not had any cough, chest pain, shortness of breath, vomiting, or diarrhea.  Patient currently denies any pain.        Past Medical History:  Diagnosis Date  . Chronic pain   . Depression   . Diastolic heart failure ([place])   . HLD (hyperlipidemia)   . HTN (hypertension)   . Hypothyroidism   . Morbid obesity ([place])   . Recurrent UTI   . Spinal stenosis   . Visual hallucinations     Patient Active Problem List   Diagnosis Date Noted  . 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 Weight loss 10/12/2017  . Needs peripherally inserted central catheter (PICC) 10/12/2017  . Subacute delirium 01/20/2017  . Decubitus ulcer of sacral region, stage 4 ([place]) 12/19/2016  . Candiduria, asymptomatic  12/18/2016  . Hallucinations 07/06/2016  . Sinus tachycardia 07/06/2016  . CONSTIPATION 07/08/2009  . DEEP VENOUS THROMBOPHLEBITIS, BILATERAL 01/07/2009  .  Confined to bed 11/22/2008  . 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 HLD (hyperlipidemia) 12/10/2006  . ANXIETY 12/10/2006  . DEPRESSION 12/10/2006  . Essential hypertension 12/10/2006  . OSTEOARTHRITIS 12/10/2006  . Hypothyroidism 09/22/2006  . HYPERCHOLESTEROLEMIA 09/22/2006  . ECZEMA 09/22/2006    Past Surgical History:  Procedure Laterality Date  . ABDOMINAL HYSTERECTOMY    . BACK SURGERY    . HEMORROIDECTOMY    . IR RADIOLOGIST EVAL & MGMT  09/01/2017    Prior to Admission medications   Medication Sig Start Date End Date Taking? Authorizing Provider  acetaminophen (TYLENOL) 325 MG tablet Take 650 mg by mouth every 6 (six) hours as needed for mild pain.    [provider]  albuterol (PROVENTIL HFA;VENTOLIN HFA) 108 (90 Base) MCG/ACT inhaler Inhale 2 puffs into the lungs every 4 (four) hours as needed for wheezing or shortness of breath.    [provider]  albuterol (PROVENTIL) (2.5 MG/3ML) 0.083% nebulizer solution Take 2.5 mg by nebulization every 6 (six) hours as needed for wheezing or shortness of breath.    [provider]  amlodipine-atorvastatin (CADUET) 2.5-10 MG tablet amlodipine 2.5 mg-atorvastatin 10 mg tablet  Take 1 tablet every day by oral route.    [provider]  amoxicillin-clavulanate (AUGMENTIN) 875-125 MG tablet Take 1 tablet by mouth 2 (two) times daily. 12/02/17   Gold[place] Circle, FNP  ARIPiprazole (ABILIFY) 2 MG tablet 1 BY MOUTH ONCE A DAY FOR MOOD AND HALLUCINATIONS 03/14/17  [provider]  aspirin EC 81 MG tablet Take 81 mg by mouth daily.    [provider]  baclofen (LIORESAL) 20 MG tablet Take 5-20 mg by mouth 3 (three) times daily. 10 mg every morning and 20 mg every evening    [provider]  collagenase (SANTYL) ointment Apply topically daily. Patient not taking: Reported on 10/11/2017 02/11/17   Adrian Saran, MD  doxycycline (VIBRA-TABS) 100 MG tablet Take 1 tablet (100 mg total) by mouth  2 (two) times daily. 12/02/17   Veryl Speak, FNP  furosemide (LASIX) 20 MG tablet Take 20 mg by mouth daily.    [provider]  levothyroxine (SYNTHROID, LEVOTHROID) 150 MCG tablet Take 150 mcg by mouth daily before breakfast.    [provider]  liver oil-zinc oxide (DESITIN) 40 % ointment Apply 1 application topically as needed (affected area). Patient not taking: Reported on 09/01/2017 02/11/17   Adrian Saran, MD  polyethylene glycol (MIRALAX / GLYCOLAX) packet Take 17 g by mouth daily.    [provider]  potassium chloride SA (K-DUR,KLOR-CON) 20 MEQ tablet Take 30 mEq by mouth daily.     [provider]  pregabalin (LYRICA) 150 MG capsule Take 150 mg by mouth 3 (three) times daily.    [provider]  Venlafaxine HCl 225 MG TB24 Take 225 mg by mouth daily.    [provider]  vitamin B-12 (CYANOCOBALAMIN) 1000 MCG tablet Take 1,000 mcg by mouth daily.    [provider]  Vitamin D, Ergocalciferol, (DRISDOL) 50000 units CAPS capsule Take 50,000 Units by mouth every 30 (thirty) days.    [provider]    Allergies Ambien [zolpidem tartrate] and Zinc  Family History  Problem Relation Age of Onset  . Heart disease Father   . Deep vein thrombosis Father     Social History Social History   Tobacco Use  . Smoking status: Never Smoker  . Smokeless tobacco: Former Neurosurgeon    Types: Snuff  Vaping Use  . Vaping Use: Never used  Substance Use Topics  . Alcohol use: No  . Drug use: No    Review of Systems Unable to obtain secondary to altered mental status  ____________________________________________   PHYSICAL EXAM:  VITAL SIGNS: ED Triage Vitals  Enc Vitals Group     BP 09/29/19 0757 (!) 89/50     Pulse Rate 09/29/19 0757 (!) 113     Resp 09/29/19 0757 16     Temp 09/29/19 0757 98.3 F (36.8 C)     Temp Source 09/29/19 0757 Oral     SpO2 09/29/19 0757 95 %     Weight 09/29/19 0801 179 lb 14.3 oz  (81.6 kg)     Height --      Head Circumference --      Peak Flow --      Pain Score --      Pain Loc --      Pain Edu? --      Excl. in GC? --     Constitutional: Somnolent but arousable to voice, oriented to person and place, but not time. Eyes: Conjunctivae are normal.  Pupils equal round and reactive bilaterally. Head: Atraumatic. Nose: No congestion/rhinnorhea. Mouth/Throat: Mucous membranes are dry. Neck: Normal ROM Cardiovascular: Normal rate, regular rhythm. Grossly normal heart sounds. Respiratory: Normal respiratory effort.  No retractions. Lungs CTAB. Gastrointestinal: Soft and nontender. No distention. Genitourinary: deferred Musculoskeletal: No lower extremity tenderness, 1+ pitting edema bilaterally. Neurologic:  Normal speech and language.  Limited strength in bilateral lower extremities, at patient's baseline per son, otherwise moving upper extremities equally. Skin: Large sacral decubitus ulcer noted without erythema, warmth, or purulent drainage. Psychiatric: Mood and affect are normal. Speech and behavior are normal.  ____________________________________________   LABS (all labs ordered are listed, but only abnormal results are displayed)  Labs Reviewed  COMPREHENSIVE METABOLIC PANEL - Abnormal; Notable for the following components:      Result Value   Sodium 131 (*)    Potassium 3.4 (*)    Chloride 89 (*)    Glucose, Bld 108 (*)    Creatinine, Ser 1.10 (*)    Calcium 7.8 (*)    Total Protein 5.2 (*)    Albumin 1.6 (*)    AST 54 (*)    Alkaline Phosphatase 174 (*)    Total Bilirubin 1.8 (*)    GFR calc non Af Amer 50 (*)    GFR calc Af Amer 58 (*)    All other components within normal limits  LACTIC ACID, PLASMA - Abnormal; Notable for the following components:   Lactic Acid, Venous 3.2 (*)    All other components within normal limits  CBC WITH DIFFERENTIAL/PLATELET - Abnormal; Notable for the following components:   WBC 22.3 (*)    RDW 18.6 (*)     Neutro Abs 17.9 (*)    Monocytes Absolute 1.7 (*)    Abs Immature Granulocytes 0.20 (*)    All other components within normal limits  PROTIME-INR - Abnormal; Notable for the following components:   Prothrombin Time 16.7 (*)    INR 1.4 (*)    All other components within normal limits  URINALYSIS, COMPLETE (UACMP) WITH MICROSCOPIC - Abnormal; Notable for the following components:   Color, Urine AMBER (*)    APPearance TURBID (*)    Protein, ur 100 (*)    Nitrite POSITIVE (*)    Leukocytes,Ua LARGE (*)    WBC, UA >50 (*)    Bacteria, UA FEW (*)    All other components within normal limits  T4, FREE - Abnormal; Notable for the following components:   Free T4 1.35 (*)    All other components within normal limits  CULTURE, BLOOD (ROUTINE X 2)  CULTURE, BLOOD (ROUTINE X 2)  TSH  LACTIC ACID, PLASMA  CORTISOL   ____________________________________________  EKG  ED ECG REPORT I, Chesley Noon, the attending physician, personally viewed and interpreted this ECG.   Date: 09/29/2019  EKG Time: 8:27  Rate: 115  Rhythm: sinus tachycardia  Axis: Normal  Intervals:right bundle branch block and left posterior fascicular block  ST&T Change: None   PROCEDURES  Procedure(s) performed (including Critical Care):  .Critical Care Performed by: Chesley Noon, MD Authorized by: Chesley Noon, MD   Critical care provider statement:    Critical care time (minutes):  45   Critical care time was exclusive of:  Separately billable procedures and treating other patients and teaching time   Critical care was necessary to treat or prevent imminent or life-threatening deterioration of the following conditions:  Sepsis   Critical care was time spent personally by me on the following activities:  Discussions with consultants, evaluation of patient's response to treatment, examination of patient, ordering and performing treatments and interventions, ordering and review of laboratory studies,  ordering and review of radiographic studies, pulse oximetry, re-evaluation of patient's condition, obtaining history from patient or surrogate and review of old charts   I assumed direction of  critical care for this patient from another provider in my specialty: no   .1-3 Lead EKG Interpretation Performed by: Chesley Noon, MD Authorized by: Chesley Noon, MD     Interpretation: normal     ECG rate:  113   ECG rate assessment: tachycardic     Rhythm: sinus tachycardia     Ectopy: none     Conduction: normal       ____________________________________________   INITIAL IMPRESSION / ASSESSMENT AND PLAN / ED COURSE       74 year old female with history of spinal stenosis, hypertension, hyperlipidemia, CHF, and hypothyroidism presents to the ED for decreased alertness noted this morning with poor appetite earlier in the week and cloudy urine noted by her son.  Patient noted to be tachycardic and hypotensive on arrival, afebrile but this was an oral temperature and we will attempt to obtain rectal temp.  I would be most concerned for sepsis related to UTI, and we will start broad-spectrum antibiotics as well as IV fluid resuscitation.  Pulmonary infection seems less likely as she has no related symptoms.  I doubt intracranial process as she does not appear to have any acute focal neurologic deficits, metabolic process more likely.  UA is consistent with UTI, which is the likely etiology of patient's sepsis.  Urine was sent for culture and she is covered with broad-spectrum antibiotics.  She received 30 cc/kg of IV fluids but remains hypotensive, was subsequently started on Levophed peripherally.  Her mental status also seems to be waning and in further discussion with son he wishes for her to be full code.  Case discussed with ICU for admission.      ____________________________________________   FINAL CLINICAL IMPRESSION(S) / ED DIAGNOSES  Final diagnoses:  Sepsis without acute  organ dysfunction, due to unspecified organism (HCC)  Altered mental status, unspecified altered mental status type  Urinary tract infection without hematuria, site unspecified     ED Discharge Orders    None       Note:  This document was prepared using Dragon voice recognition software and may include unintentional dictation errors.   Chesley Noon, MD 09/29/19 1124

## 2019-09-29 NOTE — Plan of Care (Addendum)
Patient goes by Jessica [name]. Patient is from home, husband and son are caregivers. 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 Admitted to ICU on 09/29/2019 with severe sepsis.

## 2019-09-29 NOTE — ED Triage Notes (Signed)
Pt in via EMS from home with c/o AMS from baseline. Pt also has pressure ulcer to bottom that has gotten worse. Pt has pitting edema to legs and feet and brown urine. Pt also febrile at home.

## 2019-09-29 NOTE — H&P (Addendum)
Name: Jessica [name] MRN: [id number] DOB: 04-May-1945    ADMISSION DATE:  09/29/2019 CONSULTATION DATE:  09/29/2019  REFERRING MD : Blake Divine, MD  CHIEF COMPLAINT:  Altered Mental Status  BRIEF PATIENT DESCRIPTION: 74 yo female admitted with sepsis secondary UTI requiring levophed gtt   SIGNIFICANT EVENTS  06/18: Pt admitted to ICU with sepsis requiring levophed gtt   STUDIES:  06/18: Chest xray  revealed mild streaky opacities in the bases favored to represent atelectasis  CULTURES: Blood cultures pending Urine culture pending MRSA PCR  ANTIBIOTICS: Cefepime Vancomycin Flagyl  HISTORY OF PRESENT ILLNESS:  This is a 74 yo female with a PMH of  Chronic pain, Diastolic CHF, Hyperlipidemia, Hypertension, Hypothyroidism, Morbid obesity, chronic indwelling Urinary catheter use, recurrent UTI, spinal stenosis and visual hallucination. She presented to [facility] ER from a home today with chief complaints of altered mental status.  Per patient's son who is currently at the bedside, patient was noted to be altered with discolored urine since last night. She also spiked a fever of 100.8 which improved with Tylenol per son. Denies associated symptoms of nausea or vomiting, hematuria, shortness of breath, dysuria, flank pain. She however endorses abdominal discomfort.   On arrival to the ED, She was afebrile with blood pressure 89/50 mm Hg and pulse rate 113 beats/min. There were no focal neurological deficits; She was alert and oriented x3, and he did not demonstrate any memory deficits. Lab results revealed Na 131, K+ 3.4, Cr 1.4, Bilirubin 1.8, AST 54, Albumin 1.6, WBC 22.3,  COVID-19 negative.  CXR showed mild bilateral atelectasis.  UA positive for UTI.  Pt met sepsis criteria he received 2.5L LR bolus, metronidazole, vancomycin, and cefepime.  Despite aggressive fluid resuscitation he remained hypotensive requiring levophed gtt. PCCM team contacted for ICU admission.   PAST MEDICAL  HISTORY :   has a past medical history of Chronic pain, Depression, Diastolic heart failure ([place]), HLD (hyperlipidemia), HTN (hypertension), Hypothyroidism, Morbid obesity ([place]), Recurrent UTI, Spinal stenosis, and Visual hallucinations.  has a past surgical history that includes Back surgery; Abdominal hysterectomy; Hemorroidectomy; and IR Radiologist Eval & Mgmt (09/01/2017).   Prior to Admission medications   Medication Sig Start Date End Date Taking? Authorizing Provider  acetaminophen (TYLENOL) 325 MG tablet Take 650 mg by mouth every 6 (six) hours as needed for mild pain.    [provider]  albuterol (PROVENTIL HFA;VENTOLIN HFA) 108 (90 Base) MCG/ACT inhaler Inhale 2 puffs into the lungs every 4 (four) hours as needed for wheezing or shortness of breath.    [provider]  albuterol (PROVENTIL) (2.5 MG/3ML) 0.083% nebulizer solution Take 2.5 mg by nebulization every 6 (six) hours as needed for wheezing or shortness of breath.    [provider]  amlodipine-atorvastatin (CADUET) 2.5-10 MG tablet amlodipine 2.5 mg-atorvastatin 10 mg tablet  Take 1 tablet every day by oral route.    [provider]  amoxicillin-clavulanate (AUGMENTIN) 875-125 MG tablet Take 1 tablet by mouth 2 (two) times daily. 12/02/17   Golden Circle, FNP  ARIPiprazole (ABILIFY) 2 MG tablet 1 BY MOUTH ONCE A DAY FOR MOOD AND HALLUCINATIONS 03/14/17   [provider]  aspirin EC 81 MG tablet Take 81 mg by mouth daily.    [provider]  baclofen (LIORESAL) 20 MG tablet Take 5-20 mg by mouth 3 (three) times daily. 10 mg every morning and 20 mg every evening    [provider]  collagenase (SANTYL) ointment Apply topically daily.  Patient not taking: Reported on 10/11/2017 02/11/17   Bettey Costa, MD  doxycycline (VIBRA-TABS) 100 MG tablet Take 1 tablet (100 mg total) by mouth 2 (two) times daily. 12/02/17   Golden Circle, FNP  furosemide (LASIX) 20 MG tablet Take  20 mg by mouth daily.    [provider]  levothyroxine (SYNTHROID, LEVOTHROID) 150 MCG tablet Take 150 mcg by mouth daily before breakfast.    [provider]  liver oil-zinc oxide (DESITIN) 40 % ointment Apply 1 application topically as needed (affected area). Patient not taking: Reported on 09/01/2017 02/11/17   Bettey Costa, MD  polyethylene glycol (MIRALAX / GLYCOLAX) packet Take 17 g by mouth daily.    [provider]  potassium chloride SA (K-DUR,KLOR-CON) 20 MEQ tablet Take 30 mEq by mouth daily.     [provider]  pregabalin (LYRICA) 150 MG capsule Take 150 mg by mouth 3 (three) times daily.    [provider]  Venlafaxine HCl 225 MG TB24 Take 225 mg by mouth daily.    [provider]  vitamin B-12 (CYANOCOBALAMIN) 1000 MCG tablet Take 1,000 mcg by mouth daily.    [provider]  Vitamin D, Ergocalciferol, (DRISDOL) 50000 units CAPS capsule Take 50,000 Units by mouth every 30 (thirty) days.    [provider]   Allergies  Allergen Reactions  . Ambien [Zolpidem Tartrate] Other (See Comments)    Reaction: altered mental status  . Zinc Other (See Comments)    Zinc ointments cause skin irritation    FAMILY HISTORY:  family history includes Deep vein thrombosis in her father; Heart disease in her father. SOCIAL HISTORY:  reports that she has never smoked. She has quit using smokeless tobacco.  Her smokeless tobacco use included snuff. She reports that she does not drink alcohol and does not use drugs.   REVIEW OF SYSTEMS:   Constitutional: Positive for fever, chills,  Negative for weight loss, malaise/fatigue and diaphoresis.  HENT: Negative for hearing loss, ear pain, nosebleeds, congestion, sore throat, neck pain, tinnitus and ear discharge.   Eyes: Negative for blurred vision, double vision, photophobia, pain, discharge and redness.  Respiratory: Negative for cough, hemoptysis, sputum production, shortness of  breath, wheezing and stridor.   Cardiovascular: Negative for chest pain, palpitations, orthopnea, claudication, PND.  Positive for leg swelling.  Gastrointestinal: Negative for heartburn, nausea, vomiting, abdominal pain, diarrhea, constipation, blood in stool and melena.  Genitourinary: Negative for dysuria, urgency, frequency, hematuria and flank pain. Indwelling urinary catheter present Musculoskeletal: Negative for myalgias, Positive back pain, joint pain and falls.  Skin: Negative for itching and rash. Positive for pressure ulcer in the buttocks Neurological: Negative for dizziness, tingling, tremors, sensory change, speech change, focal weakness, seizures, loss of consciousness, weakness and headaches.  Endo/Heme/Allergies: Negative for environmental allergies and polydipsia. Does not bruise/bleed easily.   VITAL SIGNS: Temp:  [98.3 F (36.8 C)] 98.3 F (36.8 C) (06/18 0757) Pulse Rate:  [91-123] 91 (06/18 1015) Resp:  [12-16] 12 (06/18 1015) BP: (70-96)/(32-52) 70/35 (06/18 1015) SpO2:  [95 %-100 %] 100 % (06/18 1015) Weight:  [81.6 kg] 81.6 kg (06/18 0801)  PHYSICAL EXAMINATION: GENERAL:  74 year old patient lying in the bed with no acute distress.  EYES: Pupils equal, round, reactive to light and accommodation. No scleral icterus. Extraocular muscles intact.  HEENT: Head atraumatic, normocephalic. Oropharynx and nasopharynx clear.  NECK:  Supple, no jugular venous distention. No thyroid enlargement, no tenderness.  LUNGS: Decreased breath sounds bilaterally, no wheezing, rales,rhonchi  or crepitation. No use of accessory muscles of respiration.  CARDIOVASCULAR: S1, S2 normal. No murmurs, rubs, or gallops.  ABDOMEN: Soft, nontender, nondistended. Bowel sounds present. No organomegaly or mass.  EXTREMITIES: Bilateral lower extremity  pedal edema, No cyanosis, or clubbing. Unna boots on bilaterally NEUROLOGIC: Cranial nerves II through XII are intact. Muscle strength 5/5 in all  extremities. Sensation intact. Gait not checked.  PSYCHIATRIC: The patient is alert and oriented x 3.  SKIN: No obvious rash, lesion. Pressure ulcer present in sacral area see below      Recent Labs  Lab 09/29/19 0840  NA 131*  K 3.4*  CL 89*  CO2 28  BUN 14  CREATININE 1.10*  GLUCOSE 108*   Recent Labs  Lab 09/29/19 0840  HGB 12.3  HCT 36.2  WBC 22.3*  PLT 276   DG Chest 1 View  Result Date: 09/29/2019 CLINICAL DATA:  Acute mental status change. Ulcer on bottom which is gotten worse. Pitting edema in legs and feet. Brown urine. EXAM: CHEST  1 VIEW COMPARISON:  November 12, 2017 FINDINGS: Stable mild cardiomegaly. The hila and mediastinum are unchanged. No pneumothorax. Mild streaky opacities in the bases favored represent atelectasis. No other acute abnormalities. IMPRESSION: Mild streaky opacities in the bases favored 2 represent atelectasis. No other acute abnormalities. Electronically Signed   By: Dorise Bullion III M.D   On: 09/29/2019 09:10    ASSESSMENT / PLAN:  Sepsis with septic shock - Likely secondary to CAUTI Previous cultures positive for ESBL Klebsiella -Admit to Stepdown -UA positive for UTI -Change Foley Catheter -Chest x-ray shows  -Covid negative -Trend Lactic acid and procalcitonin -Obtain MRSA PCR -Urine cultures pending -Blood cultures pending -Was started on Cefepime, Vancomycin and Flagyl in the ED will  Switch to Meropenem given hx of ESBL Klebsiella - IVFs and PRN bolus to keep MAP<25mHg or SBP <921mg  Acute Kidney Injury  Hypokalemia and Hyponatremia -Monitor I&O's / urinary output -Follow BMP -Ensure adequate renal perfusion -Avoid nephrotoxic agents as able -Replace electrolytes as indicated  Chronic CHF (last known EF 75% in 2018) Hypertension Hx: Chronic bilateral lower extremity edema  -Continuous cardiac monitoring -Maintain MAP greater than 65 -IV Lasix as blood pressure and renal function permits; currently Holding Lasix    -Hold Norvasc -Repeat 2D Echocardiogram -Hold Lasix as above -Will give Albumin   Unstageable Sacral Pressure Ulcer -Turn q2hrs  -Wound Care consult  Acute encephalopathy likely secondary to infectious process -Treat underlying cause -Maintain RASS goal 0 to -1  Hypothyroidism -Continue Synthroid   Best Practice: VTE px: subq Heparin SUP px: IV Protonix Diet: Regular Diet     ElRufina FalcoDNP, CCRN, FNP-BC AGProvidence[facility] Nursing   09/29/2019, 11:12 AM

## 2019-09-29 NOTE — Consult Note (Signed)
CODE SEPSIS - PHARMACY COMMUNICATION  **Broad Spectrum Antibiotics should be administered within 1 hour of Sepsis diagnosis**  Time Code Sepsis Called/Page Received: 8403  Antibiotics Ordered: vancomycin and cefepime  Time of 1st antibiotic administration: 0916  Additional action taken by pharmacy: none required  If necessary, Name of Provider/Nurse Contacted: N/A    Lowella Bandy ,PharmD Clinical Pharmacist  09/29/2019  8:50 AM

## 2019-09-29 NOTE — Progress Notes (Signed)
   09/29/19 1800  Clinical Encounter Type  Visited With Patient and family together  Visit Type Follow-up  Referral From Chaplain  Consult/Referral To Chaplain  Chaplain briefly visited with family. Husband sitting and watching television and the son is standing at bedside. Son said Mal Amabile came when he needed someone. He is most appreciative for her visit. Son share how his mother had been sicker than this and Liam Rogers prayed for her and she got better. That caused him to believe in miracles. He calls his mother his baby. He told me his parents have been married for 59 yrs and to have both parents at the age of 29 is a blessing. Son said the tears he'll shed will not be because he doesn't know that she is being taken care of but they will be because she is not where he want her to be home. He is confident she will be well taken care of. He spoke highly of her nurse.  Son is upset because he cannot spend the night, but will be back in the morning. Chaplain told son and husband chaplain availability. Son asked chaplain to keep praying and she said she will.

## 2019-09-29 NOTE — Consult Note (Signed)
Pharmacy Antibiotic Note  Jessica [name] is a 74 y.o. female with a PMH of  chronic pain, HFpEF, hyperlipidemia, hypertension, hypothyroidism, morbid obesity, chronic indwelling urinary catheter use, recurrent UTI, spinal stenosis admitted on 09/29/2019 with a UTI .  Pharmacy has been consulted for meropenem dosing. She has a history of ESBL Klebsiella UTIs  Plan: start meropenem 1000 mg IV every 8 hours  Weight: 81.6 kg (179 lb 14.3 oz)  Temp (24hrs), Avg:98.3 F (36.8 C), Min:98.3 F (36.8 C), Max:98.3 F (36.8 C)  Recent Labs  Lab 09/29/19 0840 09/29/19 0841 09/29/19 1118  WBC 22.3*  --   --   CREATININE 1.10*  --   --   LATICACIDVEN  --  3.2* 3.2*    CrCl cannot be calculated (Unknown ideal weight.).    Allergies  Allergen Reactions  . Ambien [Zolpidem Tartrate] Other (See Comments)    Reaction: altered mental status  . Zinc Other (See Comments)    Zinc ointments cause skin irritation    Antimicrobials this admission: vancomycin 6/18 x 1 cefepime 6/18 x 1 metronidazole 6/18 x 1 meropenem 6/18 >>   Microbiology results: 6/18 BCx: pending 6/18 UCx: pending  6/18 SARS CoV-2: negative   Thank you for allowing pharmacy to be a part of this patient's care.  Lowella Bandy 09/29/2019 1:58 PM

## 2019-09-30 DIAGNOSIS — L8990 Pressure ulcer of unspecified site, unspecified stage: Secondary | ICD-10-CM | POA: Insufficient documentation

## 2019-09-30 LAB — BLOOD CULTURE ID PANEL (REFLEXED)

## 2019-09-30 LAB — COMPREHENSIVE METABOLIC PANEL
ALT: 21 U/L (ref 0–44)
AST: 44 U/L — ABNORMAL HIGH (ref 15–41)
Albumin: 2 g/dL — ABNORMAL LOW (ref 3.5–5.0)
Alkaline Phosphatase: 154 U/L — ABNORMAL HIGH (ref 38–126)
Anion gap: 11 (ref 5–15)
BUN: 13 mg/dL (ref 8–23)
CO2: 28 mmol/L (ref 22–32)
Calcium: 7.8 mg/dL — ABNORMAL LOW (ref 8.9–10.3)
Chloride: 95 mmol/L — ABNORMAL LOW (ref 98–111)
Creatinine, Ser: 0.83 mg/dL (ref 0.44–1.00)
GFR calc Af Amer: >60 mL/min (ref >60)
GFR calc non Af Amer: >60 mL/min (ref >60)
Glucose, Bld: 90 mg/dL (ref 70–99)
Potassium: 3.1 mmol/L — ABNORMAL LOW (ref 3.5–5.1)
Sodium: 134 mmol/L — ABNORMAL LOW (ref 135–145)
Total Bilirubin: 2 mg/dL — ABNORMAL HIGH (ref 0.3–1.2)
Total Protein: 5.1 g/dL — ABNORMAL LOW (ref 6.5–8.1)

## 2019-09-30 LAB — MAGNESIUM: Magnesium: 1.3 mg/dL — ABNORMAL LOW (ref 1.7–2.4)

## 2019-09-30 LAB — PHOSPHORUS: Phosphorus: 3.4 mg/dL (ref 2.5–4.6)

## 2019-09-30 LAB — CBC
HCT: 33.4 % — ABNORMAL LOW (ref 36.0–46.0)
Hemoglobin: 11.5 g/dL — ABNORMAL LOW (ref 12.0–15.0)
MCH: 27.6 pg (ref 26.0–34.0)
MCHC: 34.4 g/dL (ref 30.0–36.0)
MCV: 80.3 fL (ref 80.0–100.0)
Platelets: 255 10*3/uL (ref 150–400)
RBC: 4.16 MIL/uL (ref 3.87–5.11)
RDW: 18.7 % — ABNORMAL HIGH (ref 11.5–15.5)
WBC: 22 10*3/uL — ABNORMAL HIGH (ref 4.0–10.5)
nRBC: 0 % (ref 0.0–0.2)

## 2019-09-30 LAB — LACTIC ACID, PLASMA: Lactic Acid, Venous: 1.2 mmol/L (ref 0.5–1.9)

## 2019-09-30 MED ORDER — MAGNESIUM SULFATE 2 GM/50ML IV SOLN
2.0000 g | Freq: Once | INTRAVENOUS | Status: AC
  Administered 2019-09-30: 2 g via INTRAVENOUS
  Filled 2019-09-30: qty 50

## 2019-09-30 MED ORDER — MAGNESIUM SULFATE 4 GM/100ML IV SOLN
4.0000 g | Freq: Once | INTRAVENOUS | Status: AC
  Administered 2019-09-30: 4 g via INTRAVENOUS
  Filled 2019-09-30: qty 100

## 2019-09-30 MED ORDER — POTASSIUM CHLORIDE 10 MEQ/100ML IV SOLN
10.0000 meq | INTRAVENOUS | Status: AC
  Administered 2019-09-30 (×6): 10 meq via INTRAVENOUS
  Filled 2019-09-30 (×6): qty 100

## 2019-09-30 NOTE — Progress Notes (Signed)
Pts vitals have remained stable throughout shift. Levo drip stopped in the beginning of shift and has remained off. Pt alert to person, place and situation off on time. This is her baseline according to her son. K and Mg and Abx hung throughout shift. Pts son has remained as bedside all day, very pleasant. Pt currently stable.

## 2019-09-30 NOTE — Consult Note (Signed)
Pharmacy Antibiotic Note  Jessica [name] is a 74 y.o. female with a PMH of  chronic pain, HFpEF, hyperlipidemia, hypertension, hypothyroidism, morbid obesity, chronic indwelling urinary catheter use, recurrent UTI, spinal stenosis admitted on 09/29/2019 with a UTI .  Pharmacy has been consulted for meropenem dosing. She has a history of ESBL Klebsiella UTIs  Plan: continue meropenem 1000 mg IV every 8 hours  Height: 5\' 10"  (177.8 cm) Weight: 95.3 kg (210 lb 1.6 oz) IBW/kg (Calculated) : 68.5  Temp (24hrs), Avg:98.1 F (36.7 C), Min:97.5 F (36.4 C), Max:98.8 F (37.1 C)  Recent Labs  Lab 09/29/19 0840 09/29/19 0841 09/29/19 1118 09/30/19 0443 09/30/19 1312  WBC 22.3*  --   --  22.0*  --   CREATININE 1.10*  --   --  0.83  --   LATICACIDVEN  --  3.2* 3.2*  --  1.2    Estimated Creatinine Clearance: 76.6 mL/min (by C-G formula based on SCr of 0.83 mg/dL).    Allergies  Allergen Reactions  . Ambien [Zolpidem Tartrate] Other (See Comments)    Reaction: altered mental status  . Zinc Other (See Comments)    Zinc ointments cause skin irritation    Antimicrobials this admission: vancomycin 6/18 x 1 cefepime 6/18 x 1 metronidazole 6/18 x 1 meropenem 6/18 >>   Microbiology results: 6/18 BCx: pending 6/18 UCx: pending  6/18 SARS CoV-2: negative   Thank you for allowing pharmacy to be a part of this patient's care.  7/18 09/30/2019 2:06 PM

## 2019-09-30 NOTE — Progress Notes (Signed)
PHARMACY - PHYSICIAN COMMUNICATION CRITICAL VALUE ALERT - BLOOD CULTURE IDENTIFICATION (BCID)  Jessica [name] is an 74 y.o. female who presented to [facility] on 09/29/2019 with a chief complaint of AMS w/ h/o ESBL Klebsiella  Assessment:  3/4 SIRS (tachycardic/pneic, WBC 22.3), LA 3.2 >> 3.2, UA leuk +, nitrite +, bacteria many, CXR atelectasis, 2/4 GNR BCID Enterbacteriaceae E. Coli [facility] -  Name of physician (or Provider) Contacted: Webb Silversmith  Current antibiotics: Meropenem  Changes to prescribed antibiotics recommended:  Patient is on recommended antibiotics - No changes needed  No results found for this or any previous visit.  Thomasene Ripple, PharmD, BCPS Clinical Pharmacist 09/30/2019  12:00 AM

## 2019-09-30 NOTE — Progress Notes (Signed)
Follow up - Critical Care Medicine Note  Patient Details:    Jessica [name] is an 74 y.o. femalewith a PMH of  Chronic pain, Diastolic CHF, Hyperlipidemia, Hypertension, Hypothyroidism, Morbid obesity, chronic indwelling Urinary catheter use, recurrent UTI, spinal stenosis and visual hallucination admitted with severe sepsis with septic shock with lactic acidosis secondary to catheter associated urinary tract infection.  Lines/tubes : Urethral Catheter (Active)  Indication for Insertion or Continuance of Catheter Chronic catheter use 09/30/19 0825  Site Assessment Clean;Intact 09/30/19 0825  Catheter Maintenance Bag below level of bladder;Catheter secured;Drainage bag/tubing not touching floor;Insertion date on drainage bag;No dependent loops 09/30/19 0825  Collection Container Standard drainage bag 09/30/19 0825  Securement Method Securing device (Describe) 09/30/19 0825  Urinary Catheter Interventions (if applicable) Unclamped 09/30/19 0825  Output (mL) 125 mL 09/30/19 0508    Microbiology/Sepsis markers: Results for orders placed or performed during the hospital encounter of 09/29/19  Culture, blood (Routine x 2)     Status: None (Preliminary result)   Collection Time: 09/29/19  8:41 AM   Specimen: BLOOD  Result Value Ref Range Status   Specimen Description BLOOD LEFT WRIST  Final   Special Requests   Final    BOTTLES DRAWN AEROBIC AND ANAEROBIC Blood Culture adequate volume   Culture   Final    NO GROWTH < 24 HOURS Performed at [facility], [address] [place], [place] [address]    Report Status PENDING  Incomplete  Culture, blood (Routine x 2)     Status: None (Preliminary result)   Collection Time: 09/29/19  8:41 AM   Specimen: BLOOD RIGHT HAND  Result Value Ref Range Status   Specimen Description   Final    BLOOD RIGHT HAND Performed at [facility], [address] [place], [place] [address]    Special Requests   Final    BOTTLES DRAWN AEROBIC  AND ANAEROBIC Blood Culture results may not be optimal due to an inadequate volume of blood received in culture bottles   Culture  Setup Time   Final    Organism ID to follow GRAM NEGATIVE RODS IN BOTH AEROBIC AND ANAEROBIC BOTTLES CRITICAL RESULT CALLED TO, READ BACK BY AND VERIFIED WITH: DAVID BESANTI 09/29/2019 1128 TTG Performed at [facility], [address] [place], [place] [address]    Culture GRAM NEGATIVE RODS  Final   Report Status PENDING  Incomplete  Blood Culture ID Panel (Reflexed)     Status: Abnormal   Collection Time: 09/29/19  8:41 AM  Result Value Ref Range Status   Enterococcus species NOT DETECTED NOT DETECTED Final   Listeria monocytogenes NOT DETECTED NOT DETECTED Final   Staphylococcus species NOT DETECTED NOT DETECTED Final   Staphylococcus aureus (BCID) NOT DETECTED NOT DETECTED Final   Streptococcus species NOT DETECTED NOT DETECTED Final   Streptococcus agalactiae NOT DETECTED NOT DETECTED Final   Streptococcus pneumoniae NOT DETECTED NOT DETECTED Final   Streptococcus pyogenes NOT DETECTED NOT DETECTED Final   Acinetobacter baumannii NOT DETECTED NOT DETECTED Final   Enterobacteriaceae species DETECTED (A) NOT DETECTED Final    Comment: Enterobacteriaceae represent a large family of gram-negative bacteria, not a single organism. CRITICAL RESULT CALLED TO, READ BACK BY AND VERIFIED WITH: DAVID BASANTI 09/30/2019 2348 TTG    Enterobacter cloacae complex NOT DETECTED NOT DETECTED Final   Escherichia coli DETECTED (A) NOT DETECTED Final    Comment: CRITICAL RESULT CALLED TO, READ BACK BY AND VERIFIED WITH: DAVID BASANTI 09/29/2019 @  2348    Klebsiella oxytoca NOT DETECTED NOT DETECTED Final   Klebsiella pneumoniae NOT DETECTED NOT DETECTED Final   Proteus species NOT DETECTED NOT DETECTED Final   Serratia marcescens NOT DETECTED NOT DETECTED Final   Carbapenem resistance NOT DETECTED NOT DETECTED Final   Haemophilus influenzae NOT DETECTED NOT  DETECTED Final   Neisseria meningitidis NOT DETECTED NOT DETECTED Final   Pseudomonas aeruginosa NOT DETECTED NOT DETECTED Final   Candida albicans NOT DETECTED NOT DETECTED Final   Candida glabrata NOT DETECTED NOT DETECTED Final   Candida krusei NOT DETECTED NOT DETECTED Final   Candida parapsilosis NOT DETECTED NOT DETECTED Final   Candida tropicalis NOT DETECTED NOT DETECTED Final    Comment: Performed at [facility], [address] [place], [place] [address]  SARS Coronavirus 2 by RT PCR (hospital order, performed in [facility] hospital lab) Nasopharyngeal Nasopharyngeal Swab     Status: None   Collection Time: 09/29/19 11:50 AM   Specimen: Nasopharyngeal Swab  Result Value Ref Range Status   SARS Coronavirus 2 NEGATIVE NEGATIVE Final    Comment: (NOTE) SARS-CoV-2 target nucleic acids are NOT DETECTED.  The SARS-CoV-2 RNA is generally detectable in upper and lower respiratory specimens during the acute phase of infection. The lowest concentration of SARS-CoV-2 viral copies this assay can detect is 250 copies / mL. A negative result does not preclude SARS-CoV-2 infection and should not be used as the sole basis for treatment or other patient management decisions.  A negative result may occur with improper specimen collection / handling, submission of specimen other than nasopharyngeal swab, presence of viral mutation(s) within the areas targeted by this assay, and inadequate number of viral copies (<250 copies / mL). A negative result must be combined with clinical observations, patient history, and epidemiological information.  Fact Sheet for Patients:   BoilerBrush.com.cy  Fact Sheet for Healthcare Providers: https://pope.com/  This test is not yet approved or  cleared by the [place] FDA and has been authorized for detection and/or diagnosis of SARS-CoV-2 by FDA under an Emergency Use Authorization (EUA).   This EUA will remain in effect (meaning this test can be used) for the duration of the COVID-19 declaration under Section 564(b)(1) of the Act, 21 U.S.C. section 360bbb-3(b)(1), unless the authorization is terminated or revoked sooner.  Performed at [facility], [address] [place], [place] [address]   MRSA PCR Screening     Status: None   Collection Time: 09/29/19  2:51 PM   Specimen: Nasal Mucosa; Nasopharyngeal  Result Value Ref Range Status   MRSA by PCR NEGATIVE NEGATIVE Final    Comment:        The GeneXpert MRSA Assay (FDA approved for NASAL specimens only), is one component of a comprehensive MRSA colonization surveillance program. It is not intended to diagnose MRSA infection nor to guide or monitor treatment for MRSA infections. Performed at [facility], [address] [place], [place] [address]     Anti-infectives:  Anti-infectives (From admission, onward)   Start     Dose/Rate Route Frequency Ordered Stop   09/30/19 0200  meropenem (MERREM) 1 g in sodium chloride 0.9 % 100 mL IVPB     Discontinue     1 g 200 mL/hr over 30 Minutes Intravenous Every 8 hours 09/29/19 2236     09/29/19 2200  ceFEPIme (MAXIPIME) 2 g in sodium chloride 0.9 % 100 mL IVPB  Status:  Discontinued  2 g 200 mL/hr over 30 Minutes Intravenous Every 12 hours 09/29/19 1403 09/29/19 1408   09/29/19 2100  vancomycin (VANCOREADY) IVPB 750 mg/150 mL  Status:  Discontinued        750 mg 150 mL/hr over 60 Minutes Intravenous Every 12 hours 09/29/19 1403 09/29/19 1408   09/29/19 1930  metroNIDAZOLE (FLAGYL) IVPB 500 mg  Status:  Discontinued        500 mg 100 mL/hr over 60 Minutes Intravenous Every 8 hours 09/29/19 1339 09/29/19 1408   09/29/19 1600  meropenem (MERREM) 1 g in sodium chloride 0.9 % 100 mL IVPB  Status:  Discontinued        1 g 200 mL/hr over 30 Minutes Intravenous Every 8 hours 09/29/19 1408 09/29/19 2236   09/29/19 0900  vancomycin (VANCOREADY) IVPB  1500 mg/300 mL        1,500 mg 150 mL/hr over 120 Minutes Intravenous  Once 09/29/19 0847 09/29/19 1124   09/29/19 0845  ceFEPIme (MAXIPIME) 2 g in sodium chloride 0.9 % 100 mL IVPB        2 g 200 mL/hr over 30 Minutes Intravenous  Once 09/29/19 0839 09/29/19 1055   09/29/19 0845  metroNIDAZOLE (FLAGYL) IVPB 500 mg        500 mg 100 mL/hr over 60 Minutes Intravenous  Once 09/29/19 0839 09/29/19 1233   09/29/19 0845  vancomycin (VANCOCIN) IVPB 1000 mg/200 mL premix  Status:  Discontinued        1,000 mg 200 mL/hr over 60 Minutes Intravenous  Once 09/29/19 0839 09/29/19 0847     Scheduled Meds: . ARIPiprazole  2 mg Oral Daily  . baclofen  5 mg Oral TID  . Chlorhexidine Gluconate Cloth  6 each Topical Daily  . heparin injection (subcutaneous)  5,000 Units Subcutaneous Q12H  . levothyroxine  150 mcg Oral Q0600  . pantoprazole (PROTONIX) IV  40 mg Intravenous QHS  . pregabalin  100 mg Oral TID  . vitamin B-12  1,000 mcg Oral Daily   Continuous Infusions: . sodium chloride 250 mL (09/30/19 1057)  . albumin human 60 mL/hr at 09/30/19 1000  . lactated ringers 75 mL/hr at 09/30/19 1000  . meropenem (MERREM) IV 1 g (09/30/19 1549)  . norepinephrine (LEVOPHED) Adult infusion Stopped (09/30/19 0907)   PRN Meds:.acetaminophen, docusate sodium, ondansetron (ZOFRAN) IV, polyethylene glycol  Best Practice/Protocols:  VTE px: subq Heparin SUP px: IV Protonix Diet: Regular Diet  Consults: Wound care  Studies: DG Chest 1 View  Result Date: 09/29/2019 CLINICAL DATA:  Acute mental status change. Ulcer on bottom which is gotten worse. Pitting edema in legs and feet. Brown urine. EXAM: CHEST  1 VIEW COMPARISON:  November 12, 2017 FINDINGS: Stable mild cardiomegaly. The hila and mediastinum are unchanged. No pneumothorax. Mild streaky opacities in the bases favored represent atelectasis. No other acute abnormalities. IMPRESSION: Mild streaky opacities in the bases favored 2 represent atelectasis. No  other acute abnormalities. Electronically Signed   By: Dorise Bullion III M.D   On: 09/29/2019 09:10   Antimicrobials this admission: vancomycin 6/18 x 1 cefepime 6/18 x 1 metronidazole 6/18 x 1 meropenem 6/18 >>   Microbiology results: 6/18 BCx: pending 6/18 UCx: pending  6/18 SARS CoV-2: negative   SIGNIFICANT EVENTS  Subjective:    Overnight Issues: No significant events noted overnight, patient's mental status improved. Per patient's son at the bedside, patient's mentals status  almost at baseline.  Objective:  Vital signs for last 24 hours: Temp:  [  97.8 F (36.6 C)-98.8 F (37.1 C)] 97.8 F (36.6 C) (06/19 0800) Pulse Rate:  [77-128] 92 (06/19 1400) Resp:  [10-23] 14 (06/19 1400) BP: (89-138)/(38-70) 89/47 (06/19 1400) SpO2:  [91 %-99 %] 94 % (06/19 1400)  Intake/Output from previous day: 06/18 0701 - 06/19 0700 In: 3809 [I.V.:1074; IV Piggyback:2735] Out: 1725 [Urine:1725]   Intake/Output this shift: Total I/O In: 814.5 [I.V.:651.8; IV Piggyback:162.8] Out: -   PHYSICAL EXAMINATION: GENERAL:  74 year old patient lying in the bed with no acute distress.  EYES: Pupils equal, round, reactive to light and accommodation. No scleral icterus. Extraocular muscles intact.  HEENT: Head atraumatic, normocephalic. Oropharynx and nasopharynx clear.  NECK: Supple, no jugular venous distention. No thyroid enlargement, no tenderness.  LUNGS: Decreased breath sounds bilaterally, no wheezing, rales,rhonchi or crepitation. No use of accessory muscles of respiration.  CARDIOVASCULAR: S1, S2 normal. No murmurs, rubs, or gallops.  ABDOMEN: Soft, nontender, nondistended. Bowel sounds present. No organomegaly or mass.  EXTREMITIES: Bilateral lower extremity  pedal edema, No cyanosis, or clubbing. Unna boots on bilaterally NEUROLOGIC: Cranial nerves II through XII are intact. Muscle strength 5/5 in all extremities. Sensation intact. Gait not checked.  PSYCHIATRIC: The patient is  alert and oriented x 3.  SKIN: No obvious rash, lesion. Pressure ulcer present in sacral area see below      Results for orders placed or performed during the hospital encounter of 09/29/19 (from the past 24 hour(s))  CBC     Status: Abnormal   Collection Time: 09/30/19  4:43 AM  Result Value Ref Range   WBC 22.0 (H) 4.0 - 10.5 K/uL   RBC 4.16 3.87 - 5.11 MIL/uL   Hemoglobin 11.5 (L) 12.0 - 15.0 g/dL   HCT 57.833.4 (L) 36 - 46 %   MCV 80.3 80.0 - 100.0 fL   MCH 27.6 26.0 - 34.0 pg   MCHC 34.4 30.0 - 36.0 g/dL   RDW 46.918.7 (H) 62.911.5 - 52.815.5 %   Platelets 255 150 - 400 K/uL   nRBC 0.0 0.0 - 0.2 %  Magnesium     Status: Abnormal   Collection Time: 09/30/19  4:43 AM  Result Value Ref Range   Magnesium 1.3 (L) 1.7 - 2.4 mg/dL  Phosphorus     Status: None   Collection Time: 09/30/19  4:43 AM  Result Value Ref Range   Phosphorus 3.4 2.5 - 4.6 mg/dL  Comprehensive metabolic panel     Status: Abnormal   Collection Time: 09/30/19  4:43 AM  Result Value Ref Range   Sodium 134 (L) 135 - 145 mmol/L   Potassium 3.1 (L) 3.5 - 5.1 mmol/L   Chloride 95 (L) 98 - 111 mmol/L   CO2 28 22 - 32 mmol/L   Glucose, Bld 90 70 - 99 mg/dL   BUN 13 8 - 23 mg/dL   Creatinine, Ser 4.130.83 0.44 - 1.00 mg/dL   Calcium 7.8 (L) 8.9 - 10.3 mg/dL   Total Protein 5.1 (L) 6.5 - 8.1 g/dL   Albumin 2.0 (L) 3.5 - 5.0 g/dL   AST 44 (H) 15 - 41 U/L   ALT 21 0 - 44 U/L   Alkaline Phosphatase 154 (H) 38 - 126 U/L   Total Bilirubin 2.0 (H) 0.3 - 1.2 mg/dL   GFR calc non Af Amer >60 >60 mL/min   GFR calc Af Amer >60 >60 mL/min   Anion gap 11 5 - 15  Lactic acid, plasma     Status: None   Collection  Time: 09/30/19  1:12 PM  Result Value Ref Range   Lactic Acid, Venous 1.2 0.5 - 1.9 mmol/L     Assessment/Plan:   Sepsis with septic shock - Likely secondary to CAUTI Hx: Previous cultures ESBL Klebsiella now with E.coli in the urine -Foley Catheter changed -Trend Lactic acid and procalcitonin -Urine cultures  pending -Blood cultures 2/4 GNR BCID Enterbacteriaceae E. 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

## 2019-10-01 DIAGNOSIS — N390 Urinary tract infection, site not specified: Secondary | ICD-10-CM

## 2019-10-01 DIAGNOSIS — A4151 Sepsis due to Escherichia coli [E. coli]: Secondary | ICD-10-CM

## 2019-10-01 DIAGNOSIS — T83511A Infection and inflammatory reaction due to indwelling urethral catheter, initial encounter: Principal | ICD-10-CM

## 2019-10-01 LAB — CBC
HCT: 29.7 % — ABNORMAL LOW (ref 36.0–46.0)
Hemoglobin: 10.1 g/dL — ABNORMAL LOW (ref 12.0–15.0)
MCH: 27.6 pg (ref 26.0–34.0)
MCHC: 34 g/dL (ref 30.0–36.0)
MCV: 81.1 fL (ref 80.0–100.0)
Platelets: 180 10*3/uL (ref 150–400)
RBC: 3.66 MIL/uL — ABNORMAL LOW (ref 3.87–5.11)
RDW: 19.2 % — ABNORMAL HIGH (ref 11.5–15.5)
WBC: 8.7 10*3/uL (ref 4.0–10.5)
nRBC: 0 % (ref 0.0–0.2)

## 2019-10-01 LAB — COMPREHENSIVE METABOLIC PANEL
ALT: 19 U/L (ref 0–44)
AST: 39 U/L (ref 15–41)
Albumin: 2.4 g/dL — ABNORMAL LOW (ref 3.5–5.0)
Alkaline Phosphatase: 132 U/L — ABNORMAL HIGH (ref 38–126)
Anion gap: 11 (ref 5–15)
BUN: 12 mg/dL (ref 8–23)
CO2: 26 mmol/L (ref 22–32)
Calcium: 8 mg/dL — ABNORMAL LOW (ref 8.9–10.3)
Chloride: 97 mmol/L — ABNORMAL LOW (ref 98–111)
Creatinine, Ser: 0.78 mg/dL (ref 0.44–1.00)
GFR calc Af Amer: >60 mL/min (ref >60)
GFR calc non Af Amer: >60 mL/min (ref >60)
Glucose, Bld: 67 mg/dL — ABNORMAL LOW (ref 70–99)
Potassium: 3.5 mmol/L (ref 3.5–5.1)
Sodium: 134 mmol/L — ABNORMAL LOW (ref 135–145)
Total Bilirubin: 1.8 mg/dL — ABNORMAL HIGH (ref 0.3–1.2)
Total Protein: 4.8 g/dL — ABNORMAL LOW (ref 6.5–8.1)

## 2019-10-01 LAB — PHOSPHORUS: Phosphorus: 2.8 mg/dL (ref 2.5–4.6)

## 2019-10-01 LAB — MAGNESIUM: Magnesium: 2.6 mg/dL — ABNORMAL HIGH (ref 1.7–2.4)

## 2019-10-01 LAB — GLUCOSE, CAPILLARY
Glucose-Capillary: 56 mg/dL — ABNORMAL LOW (ref 70–99)
Glucose-Capillary: 63 mg/dL — ABNORMAL LOW (ref 70–99)
Glucose-Capillary: 95 mg/dL (ref 70–99)

## 2019-10-01 LAB — LACTIC ACID, PLASMA: Lactic Acid, Venous: 0.9 mmol/L (ref 0.5–1.9)

## 2019-10-01 MED ORDER — DEXTROSE 50 % IV SOLN
INTRAVENOUS | Status: AC
  Administered 2019-10-01: 12.5 g via INTRAVENOUS
  Filled 2019-10-01: qty 50

## 2019-10-01 MED ORDER — DEXTROSE 50 % IV SOLN: 12.5000 g | Freq: Once | INTRAVENOUS | Status: AC

## 2019-10-01 MED ORDER — SODIUM CHLORIDE 0.9 % IV BOLUS
1000.0000 mL | Freq: Once | INTRAVENOUS | Status: AC
  Administered 2019-10-01: 1000 mL via INTRAVENOUS

## 2019-10-01 MED ORDER — ZINC OXIDE 11.3 % EX CREA
TOPICAL_CREAM | Freq: Two times a day (BID) | CUTANEOUS | Status: DC
  Administered 2019-10-03 – 2019-10-05 (×4): 1 via TOPICAL
  Filled 2019-10-01 (×3): qty 56

## 2019-10-01 NOTE — Progress Notes (Signed)
PROGRESS NOTE    Jessica [name]  TGG:[id number] DOB: 03-08-1946 DOA: 09/29/2019 PCP: Clovia Cuff, MD   Chief complaint.  Follow-up on sepsis and UTI.  Brief Narrative: (Start on day 1 of progress note - keep it brief and live) Jessica [name] is an 74 y.o. femalewith a PMH ofChronic pain, Diastolic CHF, Hyperlipidemia, Hypertension, Hypothyroidism, Morbid obesity, chronic indwelling Urinary catheter use, recurrent UTI, spinal stenosis and visual hallucination admitted with severe sepsis with septic shock with lactic acidosis secondary to catheter associated urinary tract infection.  Has been bedbound baseline.  She also has a chronic Foley catheter due to large decubitus ulcer in the sacrum.  6/20.  Sepsis improved, lactic acid level normalized.  IV fluids discontinued.  Patient has been fed by family, has good appetite.  Pending wound care evaluation for sacrum wound, continue antibiotics for UTI.  Family is not interested in palliative care.   Assessment & Plan:   Active Problems:   Severe sepsis (HCC)   Pressure injury of skin  #1.  Septic shock with E. coli septicemia. Patient currently is hemodynamically stable.  Blood culture came back with E. coli.  Final culture still pending.  Urine culture still pending.  Sacrum wound does not seem to be infected.  Will obtain wound care to look at it. Family is not interested in palliative care.  At this point, I will continue IV antibiotics with meropenem, pending final culture results.  May need a PICC line based on culture results.  2.  Likely urinary tract infection secondary to indwelling Foley catheter. Antibiotics as above.  3.  Sacral decubitus ulcer. Wound does not look infected.  Will obtain wound care evaluation.  May consider surgical debridement if needed.  4.  Acute kidney injury secondary to septic shock. Renal function improving.  5.  Severe protein calorie malnutrition. Patient appetite seem to be  improving.  6.  Hypoglycemia. Patient did not take any insulin.  Her appetite improved today.  Continue to follow.    DVT prophylaxis: Heparin Code Status: Full Family Communication: Had a long discussion with patient son and husband at the bedside.  They blamed hospital for patient decubitus ulcer.  They do not have any interest for palliative care, they want patient to recover and be sent home.  They do not want patient be transferred to the medical floor.   Disposition Plan:  . Patient came from: Home           . Anticipated d/c place: Home . Barriers to d/c OR conditions which need to be met to effect a safe d/c:   Consultants:   Wound care  Procedures: None Antimicrobials: Meropenem  Subjective: Patient feels better today.  She is fed by her son, she has good appetite.  No nausea vomiting. Denies any short of breath or cough. No abdominal pain or nausea vomiting. No fever or chills.  Objective: Vitals:   10/01/19 0300 10/01/19 0400 10/01/19 0454 10/01/19 0500  BP: (!) 110/51 (!) 117/58  (!) 106/52  Pulse: 75 87  82  Resp: _0 Temp:      TempSrc:      SpO2: 95% 94%  94%  Weight:   96.6 kg   Height:        Intake/Output Summary (Last 24 hours) at 10/01/2019 0902 Last data filed at 10/01/2019 0500 Gross per 24 hour  Intake 4269.6 ml  Output 1100 ml  Net 3169.6 ml   [employer]  09/29/19 0801 09/29/19 1445 10/01/19 0454  Weight: 81.6 kg 95.3 kg 96.6 kg    Examination:  General exam: Appears calm and comfortable  Respiratory system: Clear to auscultation. Respiratory effort normal. Cardiovascular system: S1 & S2 heard, RRR. No JVD, murmurs, rubs, gallops or clicks. No pedal edema. Gastrointestinal system: Abdomen is nondistended, soft and nontender. No organomegaly or masses felt. Normal bowel sounds heard. Central nervous system: Alert and oriented to person and place. No focal neurological deficits. Extremities: Symmetric  Skin: Sacrum large  decubitus ulcer noted. Psychiatry:  Mood & affect appropriate.     Data Reviewed: I have personally reviewed following labs and imaging studies  CBC: Recent Labs  Lab 09/29/19 0840 09/30/19 0443 10/01/19 0428  WBC 22.3* 22.0* 8.7  NEUTROABS 17.9*  --   --   HGB 12.3 11.5* 10.1*  HCT 36.2 33.4* 29.7*  MCV 81.5 80.3 81.1  PLT 276 255 347   Basic Metabolic Panel: Recent Labs  Lab 09/29/19 0840 09/30/19 0443 10/01/19 0428  NA 131* 134* 134*  K 3.4* 3.1* 3.5  CL 89* 95* 97*  CO2 _0 GLUCOSE 108* 90 67*  BUN _1 CREATININE 1.10* 0.83 0.78  CALCIUM 7.8* 7.8* 8.0*  MG  --  1.3* 2.6*  PHOS  --  3.4 2.8   GFR: Estimated Creatinine Clearance: 80 mL/min (by C-G formula based on SCr of 0.78 mg/dL). Liver Function Tests: Recent Labs  Lab 09/29/19 0840 09/30/19 0443 10/01/19 0428  AST 54* 44* 39  ALT _2 ALKPHOS 174* 154* 132*  BILITOT 1.8* 2.0* 1.8*  PROT 5.2* 5.1* 4.8*  ALBUMIN 1.6* 2.0* 2.4*   No results for input(s): LIPASE, AMYLASE in the last 168 hours. No results for input(s): AMMONIA in the last 168 hours. Coagulation Profile: Recent Labs  Lab 09/29/19 0840  INR 1.4*   Cardiac Enzymes: No results for input(s): CKTOTAL, CKMB, CKMBINDEX, TROPONINI in the last 168 hours. BNP (last 3 results) No results for input(s): PROBNP in the last 8760 hours. HbA1C: No results for input(s): HGBA1C in the last 72 hours. CBG: Recent Labs  Lab 09/29/19 1436 09/29/19 1438 10/01/19 0545 10/01/19 0620  GLUCAP 49* 130* 56* 63*   Lipid Profile: No results for input(s): CHOL, HDL, LDLCALC, TRIG, CHOLHDL, LDLDIRECT in the last 72 hours. Thyroid Function Tests: Recent Labs    09/29/19 0840  TSH 4.196  FREET4 1.35*   Anemia Panel: No results for input(s): VITAMINB12, FOLATE, FERRITIN, TIBC, IRON, RETICCTPCT in the last 72 hours. Sepsis Labs: Recent Labs  Lab 09/29/19 0841 09/29/19 1118 09/30/19 1312 10/01/19 0428  LATICACIDVEN 3.2* 3.2* 1.2  0.9    Recent Results (from the past 240 hour(s))  Culture, blood (Routine x 2)     Status: None (Preliminary result)   Collection Time: 09/29/19  8:41 AM   Specimen: BLOOD  Result Value Ref Range Status   Specimen Description BLOOD LEFT WRIST  Final   Special Requests   Final    BOTTLES DRAWN AEROBIC AND ANAEROBIC Blood Culture adequate volume   Culture   Final    NO GROWTH 2 DAYS Performed at [facility], [address] [place], [place] [address]    Report Status PENDING  Incomplete  Culture, blood (Routine x 2)     Status: Abnormal (Preliminary result)   Collection Time: 09/29/19  8:41 AM   Specimen: BLOOD RIGHT HAND  Result Value Ref Range Status   Specimen Description  Final    BLOOD RIGHT HAND Performed at [facility], [place] [address], [place], [place] [address]    Special Requests   Final    BOTTLES DRAWN AEROBIC AND ANAEROBIC Blood Culture results may not be optimal due to an inadequate volume of blood received in culture bottles Performed at [facility], [address], [place], [place] [address]    Culture  Setup Time   Final    GRAM NEGATIVE RODS IN BOTH AEROBIC AND ANAEROBIC BOTTLES CRITICAL RESULT CALLED TO, READ BACK BY AND VERIFIED WITH: DAVID BESANTI 09/29/2019 1128 TTG Performed at [facility], [place] [address], [place], [place] [address]    Culture ESCHERICHIA COLI (A)  Final   Report Status PENDING  Incomplete  Blood Culture ID Panel (Reflexed)     Status: Abnormal   Collection Time: 09/29/19  8:41 AM  Result Value Ref Range Status   Enterococcus species NOT DETECTED NOT DETECTED Final   Listeria monocytogenes NOT DETECTED NOT DETECTED Final   Staphylococcus species NOT DETECTED NOT DETECTED Final   Staphylococcus aureus (BCID) NOT DETECTED NOT DETECTED Final   Streptococcus species NOT DETECTED NOT DETECTED Final   Streptococcus agalactiae NOT DETECTED NOT DETECTED Final   Streptococcus pneumoniae NOT DETECTED NOT  DETECTED Final   Streptococcus pyogenes NOT DETECTED NOT DETECTED Final   Acinetobacter baumannii NOT DETECTED NOT DETECTED Final   Enterobacteriaceae species DETECTED (A) NOT DETECTED Final    Comment: Enterobacteriaceae represent a large family of gram-negative bacteria, not a single organism. CRITICAL RESULT CALLED TO, READ BACK BY AND VERIFIED WITH: DAVID BASANTI 09/30/2019 2348 TTG    Enterobacter cloacae complex NOT DETECTED NOT DETECTED Final   Escherichia coli DETECTED (A) NOT DETECTED Final    Comment: CRITICAL RESULT CALLED TO, READ BACK BY AND VERIFIED WITH: DAVID BASANTI 09/29/2019 _0     Klebsiella oxytoca NOT DETECTED NOT DETECTED Final   Klebsiella pneumoniae NOT DETECTED NOT DETECTED Final   Proteus species NOT DETECTED NOT DETECTED Final   Serratia marcescens NOT DETECTED NOT DETECTED Final   Carbapenem resistance NOT DETECTED NOT DETECTED Final   Haemophilus influenzae NOT DETECTED NOT DETECTED Final   Neisseria meningitidis NOT DETECTED NOT DETECTED Final   Pseudomonas aeruginosa NOT DETECTED NOT DETECTED Final   Candida albicans NOT DETECTED NOT DETECTED Final   Candida glabrata NOT DETECTED NOT DETECTED Final   Candida krusei NOT DETECTED NOT DETECTED Final   Candida parapsilosis NOT DETECTED NOT DETECTED Final   Candida tropicalis NOT DETECTED NOT DETECTED Final    Comment: Performed at [facility], [address] [place], [place] [address]  SARS Coronavirus 2 by RT PCR (hospital order, performed in [facility] hospital lab) Nasopharyngeal Nasopharyngeal Swab     Status: None   Collection Time: 09/29/19 11:50 AM   Specimen: Nasopharyngeal Swab  Result Value Ref Range Status   SARS Coronavirus 2 NEGATIVE NEGATIVE Final    Comment: (NOTE) SARS-CoV-2 target nucleic acids are NOT DETECTED.  The SARS-CoV-2 RNA is generally detectable in upper and lower respiratory specimens during the acute phase of infection. The lowest concentration of SARS-CoV-2  viral copies this assay can detect is 250 copies / mL. A negative result does not preclude SARS-CoV-2 infection and should not be used as the sole basis for treatment or other patient management decisions.  A negative result may occur with improper specimen collection / handling, submission of specimen other than nasopharyngeal swab, presence of viral mutation(s) within the  areas targeted by this assay, and inadequate number of viral copies (<250 copies / mL). A negative result must be combined with clinical observations, patient history, and epidemiological information.  Fact Sheet for Patients:   StrictlyIdeas.no  Fact Sheet for Healthcare Providers: BankingDealers.co.za  This test is not yet approved or  cleared by the [place] FDA and has been authorized for detection and/or diagnosis of SARS-CoV-2 by FDA under an Emergency Use Authorization (EUA).  This EUA will remain in effect (meaning this test can be used) for the duration of the COVID-19 declaration under Section 564(b)(1) of the Act, 21 U.S.C. section 360bbb-3(b)(1), unless the authorization is terminated or revoked sooner.  Performed at [facility], [address] [place], [place] [address]   MRSA PCR Screening     Status: None   Collection Time: 09/29/19  2:51 PM   Specimen: Nasal Mucosa; Nasopharyngeal  Result Value Ref Range Status   MRSA by PCR NEGATIVE NEGATIVE Final    Comment:        The GeneXpert MRSA Assay (FDA approved for NASAL specimens only), is one component of a comprehensive MRSA colonization surveillance program. It is not intended to diagnose MRSA infection nor to guide or monitor treatment for MRSA infections. Performed at [facility], [address] [place], [place] [address]   Urine Culture     Status: None (Preliminary result)   Collection Time: 09/29/19  2:52 PM   Specimen: Urine, Catheterized  Result Value Ref  Range Status   Specimen Description   Final    URINE, CATHETERIZED Performed at [facility], [address], [place], [place] [address]    Special Requests   Final    Immunocompromised Performed at [facility], [address] [place], [place] [address]    Culture   Final    CULTURE REINCUBATED FOR BETTER GROWTH Performed at [facility], [place] [address], [place], [place] [address]    Report Status PENDING  Incomplete         Radiology Studies: No results found.      Scheduled Meds: . ARIPiprazole  2 mg Oral Daily  . baclofen  5 mg Oral TID  . Chlorhexidine Gluconate Cloth  6 each Topical Daily  . heparin injection (subcutaneous)  5,000 Units Subcutaneous Q12H  . levothyroxine  150 mcg Oral Q0600  . pantoprazole (PROTONIX) IV  40 mg Intravenous QHS  . pregabalin  100 mg Oral TID  . vitamin B-12  1,000 mcg Oral Daily   Continuous Infusions: . sodium chloride 250 mL (10/01/19 0526)  . albumin human Stopped (09/30/19 2334)  . meropenem (MERREM) IV Stopped (10/01/19 0316)  . norepinephrine (LEVOPHED) Adult infusion Stopped (09/30/19 0907)     LOS: 2 days    Time spent: 50 minutes    Sharen Hones, MD [facility]   To contact the attending provider between 7A-7P or the covering provider during after hours 7P-7A, please log into the web site [email or web address] and access using universal [place] password for that web site. If you do not have the password, please call the hospital operator.  10/01/2019, 9:02 AM

## 2019-10-01 NOTE — Progress Notes (Signed)
Patients lab glucose was 67. Checked CBG and was 56. 4oz OJ given per hypoglycemic protocol and 15 minute recheck was 63. Patient is not wanting to drink more OJ, ordered and gave D50 12.5g IV per hypoglycemic protocol.  Maggie, NP notified standing order activated.

## 2019-10-01 NOTE — Consult Note (Signed)
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 Patient is followed by the outpatient wound care center at [facility], last seen 08/15/19.  Wound type: Pressure Pressure Injury POA: Yes Measurement: 5cm x 3cm x 1.6cm with 2cm undermining at 12 o'clock. There is a darkened area with purple/maroon hues at the left margin from 8-11 o'clock. This is not a new finding as it was noted on the 08/15/19 visit at the [facility]. Wound bed: Red, moist and free of nonviable tissue. No bone palpated today. Drainage (amount, consistency, odor) moderate amount serous exudate Periwound: erythematous, with partial thickness skin loss to varying degrees.  This is also not a new finding, was present on 08/15/19 visit to [facility] and there is an element of hte POC aimed at protection of this tissue. Dressing procedure/placement/frequency: Patient's family member is well versed in the POC and provides excellent and detailed instructions that are consistent with the notes from Martin, III, PA at the [facility].  We will not continue the 30-minute soap of the wound with sodium hypochlorite solution prior to wound care given the ability to execute that care in the acute setting.   We will remove the dressings, cleanse with NS, pat dry. The periwound tissue will be treated with a thin layer of 11.3% zinc oxide cream (Balmex)-note: this percentage is acceptable to [employer], III and the patient's family caregiver reports the concentration of zinc is low enough to not contribute to an allergic reaction. The defect will be lined with a calcium alginate, then the wound fluff-packed to skin level.  The wound and periwound tissue will be covered with an ABD pad and secured with paper tape.  The patient will be turned from side to side per protocol and the [facility] will be elevated to 30 degrees unless breathing difficulties are observed. The Prevalon heel boots are in place and will be removed twice daily for assessment of  the feet/heels/LEs. Mositurizing will be applied to the heels once daily after bathing.  WOC nursing team will not follow, but will remain available to this patient, the nursing and medical teams.  Please re-consult if needed. Thanks, Ladona Mow, MSN, RN, GNP, [place]  Pager# [phone]

## 2019-10-01 NOTE — Consult Note (Signed)
Pharmacy Antibiotic Note  Jessica [name] is a 74 y.o. female with a PMH of  chronic pain, HFpEF, hyperlipidemia, hypertension, hypothyroidism, morbid obesity, chronic indwelling urinary catheter use, recurrent UTI, spinal stenosis admitted on 09/29/2019 with a UTI .  Pharmacy has been consulted for meropenem dosing. She has a history of ESBL Klebsiella UTIs.   Bcx growing Ecoli - f/u with sensitivities.   Plan: continue meropenem 1000 mg IV every 8 hours. Narrow once sensitivities are released.   Height: 5\' 10"  (177.8 cm) Weight: 96.6 kg (212 lb 15.4 oz) IBW/kg (Calculated) : 68.5  Temp (24hrs), Avg:97.7 F (36.5 C), Min:97.5 F (36.4 C), Max:97.9 F (36.6 C)  Recent Labs  Lab 09/29/19 0840 09/29/19 0841 09/29/19 1118 09/30/19 0443 09/30/19 1312 10/01/19 0428  WBC 22.3*  --   --  22.0*  --  8.7  CREATININE 1.10*  --   --  0.83  --  0.78  LATICACIDVEN  --  3.2* 3.2*  --  1.2 0.9    Estimated Creatinine Clearance: 80 mL/min (by C-G formula based on SCr of 0.78 mg/dL).    Allergies  Allergen Reactions  . Ambien [Zolpidem Tartrate] Other (See Comments)    Reaction: altered mental status  . Zinc Other (See Comments)    Zinc ointments cause skin irritation    Antimicrobials this admission: vancomycin 6/18 x 1 cefepime 6/18 x 1 metronidazole 6/18 x 1 meropenem 6/18 >>   Microbiology results: 6/18 BCx: E.coli 6/18 UCx: pending  6/18 SARS CoV-2: negative   Thank you for allowing pharmacy to be a part of this patient's care.  7/18 10/01/2019 11:21 AM

## 2019-10-01 NOTE — Plan of Care (Addendum)
Patient goes by Consuella Lose. Patient is from home, husband and son are caregivers. 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 Admitted to ICU on 09/29/2019 with severe sepsis. BP continues to remain stable, decreased urine output noted with IVF bolus given. Patient continues to be turned from left to right to keep pressure off of coccyx. Awaiting for WOC evaluation.

## 2019-10-01 NOTE — Progress Notes (Addendum)
Patient improving, is alert to self, place, and situation. Confused to time.  LR continues to infuse at ordered rate. BP 95/51 (64) and 200 urine output since start of shift, Maggie, NP notified and order entered for 1L NS IVF bolus. Patient turn 2 Q hours with stage IV pressure ulcer on coccyx draining, foam pad changed and wound consult pending. Patient denies any pain. VSS. Patient kept safe.

## 2019-10-01 NOTE — Progress Notes (Signed)
Patient vitals remain WDL.  Patient is lethargic.  Family at bedside to help with turning and feeding.  She consumed her breakfast lunch and dinner about 50%. Her LR was stopped around 1100.  She is consuming the thickened water with a straw.  Wound care RN came by to assess wound on coccyx and left wound care orders.  Changed wound on coccyx twice.

## 2019-10-02 ENCOUNTER — Inpatient Hospital Stay: Payer: Medicare Other

## 2019-10-02 ENCOUNTER — Inpatient Hospital Stay (HOSPITAL_COMMUNITY)
Admit: 2019-10-02 | Discharge: 2019-10-02 | Disposition: A | Discharge status: Home / Self Care | Payer: Medicare Other | Attending: Pulmonary Disease | Admitting: Pulmonary Disease

## 2019-10-02 ENCOUNTER — Other Ambulatory Visit: Payer: Self-pay

## 2019-10-02 DIAGNOSIS — I5031 Acute diastolic (congestive) heart failure: Secondary | ICD-10-CM

## 2019-10-02 LAB — URINE CULTURE: Culture: >=100000 — AB

## 2019-10-02 LAB — URINALYSIS, ROUTINE W REFLEX MICROSCOPIC

## 2019-10-02 LAB — ECHOCARDIOGRAM COMPLETE
Height: 70 in
Weight: 3467.39 oz

## 2019-10-02 MED ORDER — CEFAZOLIN SODIUM-DEXTROSE 2-4 GM/100ML-% IV SOLN
2.0000 g | Freq: Three times a day (TID) | INTRAVENOUS | Status: AC
  Administered 2019-10-02: 2 g via INTRAVENOUS
  Filled 2019-10-02 (×2): qty 100

## 2019-10-02 MED ORDER — CEFAZOLIN SODIUM-DEXTROSE 1-4 GM/50ML-% IV SOLN
1.0000 g | Freq: Three times a day (TID) | INTRAVENOUS | Status: DC
  Administered 2019-10-02: 1 g via INTRAVENOUS
  Filled 2019-10-02 (×2): qty 50

## 2019-10-02 MED ORDER — SULFAMETHOXAZOLE-TRIMETHOPRIM 800-160 MG PO TABS
1.0000 | ORAL_TABLET | Freq: Two times a day (BID) | ORAL | Status: DC
  Filled 2019-10-02 (×2): qty 1

## 2019-10-02 MED ORDER — FUROSEMIDE 10 MG/ML IJ SOLN
40.0000 mg | Freq: Every day | INTRAMUSCULAR | Status: DC
  Administered 2019-10-02: 40 mg via INTRAVENOUS

## 2019-10-02 MED ORDER — CEFAZOLIN SODIUM-DEXTROSE 2-4 GM/100ML-% IV SOLN
2.0000 g | Freq: Three times a day (TID) | INTRAVENOUS | Status: DC
  Administered 2019-10-02 – 2019-10-05 (×8): 2 g via INTRAVENOUS
  Filled 2019-10-02 (×10): qty 100

## 2019-10-02 MED ORDER — LINEZOLID 600 MG PO TABS: 600.0000 mg | ORAL_TABLET | Freq: Two times a day (BID) | ORAL | Status: DC

## 2019-10-02 NOTE — Progress Notes (Signed)
   10/02/19 0810  Clinical Encounter Type  Visited With Patient and family together  Visit Type Follow-up  Referral From Chaplain  Consult/Referral To Chaplain  Chaplain stopped by to check on patient and family. Son, [name] said that his mother is doing much better. Chaplain asked did the doctor say she was better and he said yes. Chaplain will follow up with them later.

## 2019-10-02 NOTE — Progress Notes (Signed)
Patient is seen by DR. Aleskerov due to worsening shortness of breath. I will pick up patient again tomorrow if condition is better and suited for med surg.

## 2019-10-02 NOTE — Progress Notes (Signed)
Patient output of 2000 ml from Lasix. Patient has had no other issues.

## 2019-10-02 NOTE — Progress Notes (Signed)
*  PRELIMINARY RESULTS* Echocardiogram 2D Echocardiogram has been performed.  Cristela Blue 10/02/2019, 1:00 PM

## 2019-10-02 NOTE — Evaluation (Signed)
Clinical/Bedside Swallow Evaluation Patient Details  Name: Jessica [name] MRN: [id number] Date of Birth: 12-01-1945  Today's Date: 10/02/2019 Time: SLP Start Time (ACUTE ONLY): 1150 SLP Stop Time (ACUTE ONLY): 1240 SLP Time Calculation (min) (ACUTE ONLY): 50 min  Past Medical History:  Past Medical History:  Diagnosis Date  . Chronic pain   . Depression   . Diastolic heart failure ([place])   . HLD (hyperlipidemia)   . HTN (hypertension)   . Hypothyroidism   . Morbid obesity ([place])   . Recurrent UTI   . Spinal stenosis   . Visual hallucinations    Past Surgical History:  Past Surgical History:  Procedure Laterality Date  . ABDOMINAL HYSTERECTOMY    . BACK SURGERY    . HEMORROIDECTOMY    . IR RADIOLOGIST EVAL & MGMT  09/01/2017   HPI:  This is a 74 yo female with a PMH of  Chronic pain, Diastolic CHF, Hyperlipidemia, Hypertension, Hypothyroidism, Morbid obesity, chronic indwelling Urinary catheter use, recurrent UTI, spinal stenosis, sacral wound and visual hallucination. She presented to [facility] ER from a home with chief complaints of altered mental status. At baseline, pt is bedbound (16 years) d/t spinal stenosis.  CXR showed mild bilateral atelectasis.  UA positive for UTI.   Assessment / Plan / Recommendation Clinical Impression  [name] were present during this bedside swallow evaluation. Son states that pt has been consuming "any foods that we eat" and thin liquids at home. Pt's current diet is soft with nectar thick liquids (last diet pt was on during previous admission 2018). Pt was easy to arouse, pleasant, baseline HR 76, 100% O2 sat, RR 14. Per report, pt consumes liquids better with straw (is currently consuming nectar thick liquids via straw). SLP provided single sips of thin water via straw x 2 with overt s/s of aspiration. Pt did produce multiple swallows, suspect piecemeal swallowing. On third sip from straw and with weak cough, suspect poor timing. All vitals  remained stable with follow up bolus of nectar thick liquids. Pt had initially used a low voice when answering questions. After the nectar thick bolus, pt was completely aphonic. With prompted attempts to produce voice, pt experienced a decline in respiratory function:  O2 sats dropped to mid 60% and remained low despite nursing intervention, HR began rising to 144 with RR remaining at 16. SLP conveyed all pertinent information to pulmonologist and RT as they arrived. Recommend pt be NPO with ST to follow for appropriateness for trials of PO intake.   SLP Visit Diagnosis: Dysphagia, unspecified (R13.10)    Aspiration Risk  Severe aspiration risk;Risk for inadequate nutrition/hydration    Diet Recommendation     Medication Administration: Via alternative means    Other  Recommendations Oral Care Recommendations: Oral care QID   Follow up Recommendations  (TBD)      Frequency and Duration min 2x/week  2 weeks       Prognosis Prognosis for Safe Diet Advancement: Guarded Barriers to Reach Goals: Cognitive deficits;Time post onset;Severity of deficits      Swallow Study   General Date of Onset: 09/29/19 HPI: This is a 74 yo female with a PMH of  Chronic pain, Diastolic CHF, Hyperlipidemia, Hypertension, Hypothyroidism, Morbid obesity, chronic indwelling Urinary catheter use, recurrent UTI, spinal stenosis, sacral wound and visual hallucination. She presented to [facility] ER from a home with chief complaints of altered mental status. At baseline, pt is bedbound (16 years) d/t spinal stenosis.  CXR showed mild  bilateral atelectasis.  UA positive for UTI. Type of Study: Bedside Swallow Evaluation Previous Swallow Assessment: BSE 2018, conservative diet of dysphagia 1 wiht nectar d/t pt deconditioning Diet Prior to this Study: Dysphagia 3 (soft);Nectar-thick liquids Temperature Spikes Noted: No Respiratory Status: Nasal cannula History of Recent Intubation: No Behavior/Cognition: Alert;Pleasant  mood Oral Cavity Assessment: Within Functional Limits Oral Care Completed by SLP: Yes Self-Feeding Abilities: Total assist Patient Positioning: Upright in bed Baseline Vocal Quality: Aphonic Volitional Cough: Cognitively unable to elicit Volitional Swallow: Unable to elicit    Oral/Motor/Sensory Function Overall Oral Motor/Sensory Function:  (appeared functional with trials)   Ice Chips Ice chips: Not tested   Thin Liquid Thin Liquid: Impaired Presentation: Straw Pharyngeal  Phase Impairments: Suspected delayed Swallow;Decreased hyoid-laryngeal movement;Multiple swallows;Cough - Immediate    Nectar Thick Nectar Thick Liquid: Within functional limits Presentation: Straw   Honey Thick Honey Thick Liquid: Not tested   Puree Puree: Not tested   Solid     Solid: Not tested     Nealy Karapetian B. Dreama Saa M.S., CCC-SLP, [facility] Speech-Language Pathologist Rehabilitation Services Office [phone]  Kerisha Goughnour 10/02/2019,1:40 PM

## 2019-10-02 NOTE — Consult Note (Signed)
CRITICAL CARE PROGRESS NOTE    Name: Jessica [name] MRN: [id number] DOB: October 23, 1945     LOS: 3   SUBJECTIVE FINDINGS & SIGNIFICANT EVENTS   Patient description:  Jessica [name] an 74 y.o.femalewith a PMH ofChronic pain, Diastolic CHF, Hyperlipidemia, Hypertension, Hypothyroidism, Morbid obesity, chronic indwelling Urinary catheter use, recurrent UTI, spinal stenosis and visual hallucinationadmitted with severe sepsis with septic shock with lactic acidosis secondary to catheter associated urinary tract infection.  Has been bedbound baseline.  She also has a chronic Foley catheter due to large decubitus ulcer in the sacrum.   Lines / Drains: PIVx2  Cultures / Sepsis markers: Urine culture +for Ecoli  Antibiotics: Ancef   Protocols / Consultants: Hospitalist, pccm  Tests / Events: TTE, CXR, lasix challenge    PAST MEDICAL HISTORY   Past Medical History:  Diagnosis Date  . Chronic pain   . Depression   . Diastolic heart failure (HCC)   . HLD (hyperlipidemia)   . HTN (hypertension)   . Hypothyroidism   . Morbid obesity (HCC)   . Recurrent UTI   . Spinal stenosis   . Visual hallucinations      SURGICAL HISTORY   Past Surgical History:  Procedure Laterality Date  . ABDOMINAL HYSTERECTOMY    . BACK SURGERY    . HEMORROIDECTOMY    . IR RADIOLOGIST EVAL & MGMT  09/01/2017     FAMILY HISTORY   Family History  Problem Relation Age of Onset  . Heart disease Father   . Deep vein thrombosis Father      SOCIAL HISTORY   Social History   Tobacco Use  . Smoking status: Never Smoker  . Smokeless tobacco: Former Neurosurgeon    Types: Snuff  Vaping Use  . Vaping Use: Never used  Substance Use Topics  . Alcohol use: No  . Drug use: No     MEDICATIONS   Current  Medication:  Current Facility-Administered Medications:  .  0.9 %  sodium chloride infusion, 250 mL, Intravenous, Continuous, Lowella Bandy, RPH, Last Rate: 10 mL/hr at 10/02/19 0600, Rate Verify at 10/02/19 0600 .  acetaminophen (TYLENOL) tablet 650 mg, 650 mg, Oral, Q4H PRN, Salena Saner, MD .  baclofen (LIORESAL) tablet 5 mg, 5 mg, Oral, TID, Salena Saner, MD, 5 mg at 10/02/19 0943 .  ceFAZolin (ANCEF) IVPB 2g/100 mL premix, 2 g, Intravenous, Q8H, Karna Christmas, Roselene Gray, MD .  Chlorhexidine Gluconate Cloth 2 % PADS 6 each, 6 each, Topical, Daily, Salena Saner, MD, 6 each at 10/02/19 [phone] .  docusate sodium (COLACE) capsule 100 mg, 100 mg, Oral, BID PRN, Salena Saner, MD .  furosemide (LASIX) injection 40 mg, 40 mg, Intravenous, Daily, Karna Christmas, Enma Maeda, MD, 40 mg at 10/02/19 1232 .  heparin injection 5,000 Units, 5,000 Units, Subcutaneous, Q12H, Salena Saner, MD, 5,000 Units at 10/02/19 0945 .  levothyroxine (SYNTHROID) tablet 150 mcg, 150 mcg, Oral, Q0600, Salena Saner, MD, 150 mcg at 10/02/19 0557 .  norepinephrine (LEVOPHED) 4mg  in premix infusion, 2-10 mcg/min, Intravenous, Titrated, , MD, Paused at 09/30/19 [phone] .  ondansetron (ZOFRAN) injection 4 mg, 4 mg, Intravenous, Q6H PRN, 4270, MD .  pantoprazole (PROTONIX) injection 40 mg, 40 mg, Intravenous, QHS, Salena Saner, MD, 40 mg at 10/01/19 2212 .  polyethylene glycol (MIRALAX / GLYCOLAX) packet 17 g, 17 g, Oral, Daily PRN, 2213, MD .  pregabalin (LYRICA) capsule 100 mg, 100 mg, Oral,  TID, Salena Saner, MD, 100 mg at 10/02/19 0943 .  vitamin B-12 (CYANOCOBALAMIN) tablet 1,000 mcg, 1,000 mcg, Oral, Daily, Salena Saner, MD, 1,000 mcg at 10/02/19 0943 .  zinc oxide (BALMEX) 11.3 % cream, , Topical, BID, Marrion Coy, MD, Given at 10/02/19 [phone]    ALLERGIES   Ambien [zolpidem tartrate] and Zinc    REVIEW OF SYSTEMS    10 point ROS  done and is negative except as per HPI and subjective findings.   PHYSICAL EXAMINATION   Vital Signs: Temp:  [97.5 F (36.4 C)-98.7 F (37.1 C)] 97.5 F (36.4 C) (06/21 1236) Pulse Rate:  [72-101] 72 (06/21 1600) Resp:  [13-34] 15 (06/21 1600) BP: (96-135)/(43-85) 102/44 (06/21 1600) SpO2:  [86 %-100 %] 98 % (06/21 1600) Weight:  [98.3 kg] 98.3 kg (06/21 0500)  GENERAL:Chronically ill appearing HEAD: Normocephalic, atraumatic.  EYES: Pupils equal, round, reactive to light.  No scleral icterus.  MOUTH: Moist mucosal membrane. NECK: Supple. No thyromegaly. No nodules. No JVD.  PULMONARY: bilateral crackles CARDIOVASCULAR: S1 and S2. Regular rate and rhythm. No murmurs, rubs, or gallops.  GASTROINTESTINAL: Soft, nontender, non-distended. No masses. Positive bowel sounds. No hepatosplenomegaly.  MUSCULOSKELETAL: 3+ pitting lower extermity edema  NEUROLOGIC: Mild distress due to acute illness SKIN:intact,warm,dry   PERTINENT DATA     Infusions: . sodium chloride 10 mL/hr at 10/02/19 0600  .  ceFAZolin (ANCEF) IV    . norepinephrine (LEVOPHED) Adult infusion Stopped (09/30/19 1914)   Scheduled Medications: . baclofen  5 mg Oral TID  . Chlorhexidine Gluconate Cloth  6 each Topical Daily  . furosemide  40 mg Intravenous Daily  . heparin injection (subcutaneous)  5,000 Units Subcutaneous Q12H  . levothyroxine  150 mcg Oral Q0600  . pantoprazole (PROTONIX) IV  40 mg Intravenous QHS  . pregabalin  100 mg Oral TID  . vitamin B-12  1,000 mcg Oral Daily  . zinc oxide   Topical BID   PRN Medications: acetaminophen, docusate sodium, ondansetron (ZOFRAN) IV, polyethylene glycol Hemodynamic parameters:   Intake/Output: 06/20 0701 - 06/21 0700 In: 2391.8 [P.O.:1800; I.V.:214.9; IV Piggyback:376.9] Out: 1250 [Urine:1250]  Ventilator  Settings:     LAB RESULTS:  Basic Metabolic Panel: Recent Labs  Lab 09/29/19 0840 09/29/19 0840 09/30/19 0443 10/01/19 0428  NA 131*  --   134* 134*  K 3.4*   < > 3.1* 3.5  CL 89*  --  95* 97*  CO2 28  --  28 26  GLUCOSE 108*  --  90 67*  BUN 14  --  13 12  CREATININE 1.10*  --  0.83 0.78  CALCIUM 7.8*  --  7.8* 8.0*  MG  --   --  1.3* 2.6*  PHOS  --   --  3.4 2.8   < > = values in this interval not displayed.   Liver Function Tests: Recent Labs  Lab 09/29/19 0840 09/30/19 0443 10/01/19 0428  AST 54* 44* 39  ALT ALKPHOS 174* 154* 132*  BILITOT 1.8* 2.0* 1.8*  PROT 5.2* 5.1* 4.8*  ALBUMIN 1.6* 2.0* 2.4*   No results for input(s): LIPASE, AMYLASE in the last 168 hours. No results for input(s): AMMONIA in the last 168 hours. CBC: Recent Labs  Lab 09/29/19 0840 09/30/19 0443 10/01/19 0428  WBC 22.3* 22.0* 8.7  NEUTROABS 17.9*  --   --   HGB 12.3 11.5* 10.1*  HCT 36.2 33.4* 29.7*  MCV 81.5 80.3 81.1  PLT 276  255 180   Cardiac Enzymes: No results for input(s): CKTOTAL, CKMB, CKMBINDEX, TROPONINI in the last 168 hours. BNP: Invalid input(s): POCBNP CBG: Recent Labs  Lab 09/29/19 1436 09/29/19 1438 10/01/19 0545 10/01/19 0620 10/01/19 0734  GLUCAP 49* 130* 56* 63* 95       IMAGING RESULTS:  Imaging: DG Chest Port 1 View  Result Date: 10/02/2019 CLINICAL DATA:  Acute hypoxemic respiratory failure. Additional provided: Hypertension, diastolic heart failure. EXAM: PORTABLE CHEST 1 VIEW COMPARISON:  Prior chest radiograph 09/29/2019 and earlier FINDINGS: Mild cardiomegaly, unchanged. Aortic atherosclerosis. Central pulmonary vascular congestion. There is a small to moderate-sized right pleural effusion with associated right basilar atelectasis and/or consolidation. No definite left pleural effusion. Mild left basilar atelectasis. No evidence of pneumothorax. No acute bony abnormality IMPRESSION: Unchanged mild cardiomegaly.  Central pulmonary vascular congestion. Small to moderate-sized right pleural effusion with associated right basilar atelectasis and/or consolidation. Mild left basilar  atelectasis. Aortic Atherosclerosis (ICD10-I70.0). Electronically Signed   By: Kellie Simmering DO   On: 10/02/2019 13:09   ECHOCARDIOGRAM COMPLETE  Result Date: 10/02/2019    ECHOCARDIOGRAM REPORT   Patient Name:   Jessica [name] [facility] Date of Exam: 10/02/2019 Medical Rec #:  [id number]         Height:       70.0 in Accession #:    [id number]        Weight:       216.7 lb Date of Birth:  [birth year]         BSA:          2.160 m Patient Age:    44 years          BP:           131/61 mmHg Patient Gender: F                 HR:           96 bpm. Exam Location:  [facility] Procedure: 2D Echo, Cardiac Doppler and Color Doppler STAT ECHO Indications:     CHF-acute diastolic 329.51  History:         Patient has prior history of Echocardiogram examinations, most                  recent 02/04/2017. Risk Factors:Hypertension and Dyslipidemia.                  Diastolic heart failure.  Sonographer:     Sherrie Sport RDCS (AE) Referring Phys:  [id number] Ottie Glazier Diagnosing Phys: Kathlyn Sacramento MD  Sonographer Comments: Technically challenging study due to limited acoustic windows and no apical window. IMPRESSIONS  1. Left ventricular ejection fraction, by estimation, is 55 to 60%. The left ventricle has normal function. Left ventricular endocardial border not optimally defined to evaluate regional wall motion. Left ventricular diastolic function could not be evaluated.  2. Right ventricular systolic function is normal. The right ventricular size is normal. Tricuspid regurgitation signal is inadequate for assessing PA pressure.  3. The mitral valve is normal in structure. No evidence of mitral valve regurgitation. No evidence of mitral stenosis.  4. The aortic valve is normal in structure. Aortic valve regurgitation is not visualized. Mild to moderate aortic valve sclerosis/calcification is present, without any evidence of aortic stenosis.  5. Technically challenging study due to limited acoustic windows and no apical window. FINDINGS   Left Ventricle: Left ventricular ejection fraction, by estimation, is 55 to 60%. The left ventricle has normal function. Left ventricular  endocardial border not optimally defined to evaluate regional wall motion. The left ventricular internal cavity size was normal in size. There is no left ventricular hypertrophy. Left ventricular diastolic function could not be evaluated. Right Ventricle: The right ventricular size is normal. No increase in right ventricular wall thickness. Right ventricular systolic function is normal. Tricuspid regurgitation signal is inadequate for assessing PA pressure. The tricuspid regurgitant velocity is 2.55 m/s, and with an assumed right atrial pressure of 10 mmHg, the estimated right ventricular systolic pressure is 36.0 mmHg. Left Atrium: Left atrial size was normal in size. Right Atrium: Right atrial size was normal in size. Pericardium: There is no evidence of pericardial effusion. Mitral Valve: The mitral valve is normal in structure. Normal mobility of the mitral valve leaflets. No evidence of mitral valve regurgitation. No evidence of mitral valve stenosis. Tricuspid Valve: The tricuspid valve is normal in structure. Tricuspid valve regurgitation is trivial. No evidence of tricuspid stenosis. Aortic Valve: The aortic valve is normal in structure. Aortic valve regurgitation is not visualized. Mild to moderate aortic valve sclerosis/calcification is present, without any evidence of aortic stenosis. Pulmonic Valve: The pulmonic valve was normal in structure. Pulmonic valve regurgitation is not visualized. No evidence of pulmonic stenosis. Aorta: The aortic root is normal in size and structure. Venous: The inferior vena cava was not well visualized. IAS/Shunts: No atrial level shunt detected by color flow Doppler.  LEFT VENTRICLE PLAX 2D LVIDd:         5.04 cm LVIDs:         2.70 cm LV PW:         0.93 cm LV IVS:        0.83 cm LVOT diam:     2.30 cm LVOT Area:     4.15 cm  LEFT  ATRIUM         Index LA diam:    2.30 cm 1.06 cm/m                        PULMONIC VALVE AORTA                 PV Vmax:        0.99 m/s Ao Root diam: 3.20 cm PV Peak grad:   3.9 mmHg                       RVOT Peak grad: 7 mmHg  TRICUSPID VALVE TR Peak grad:   26.0 mmHg TR Vmax:        255.00 cm/s  SHUNTS Systemic Diam: 2.30 cm Lorine Bears MD Electronically signed by Lorine Bears MD Signature Date/Time: 10/02/2019/1:31:07 PM    Final    @PROBHOSP @ DG Chest Port 1 View  Result Date: 10/02/2019 CLINICAL DATA:  Acute hypoxemic respiratory failure. Additional provided: Hypertension, diastolic heart failure. EXAM: PORTABLE CHEST 1 VIEW COMPARISON:  Prior chest radiograph 09/29/2019 and earlier FINDINGS: Mild cardiomegaly, unchanged. Aortic atherosclerosis. Central pulmonary vascular congestion. There is a small to moderate-sized right pleural effusion with associated right basilar atelectasis and/or consolidation. No definite left pleural effusion. Mild left basilar atelectasis. No evidence of pneumothorax. No acute bony abnormality IMPRESSION: Unchanged mild cardiomegaly.  Central pulmonary vascular congestion. Small to moderate-sized right pleural effusion with associated right basilar atelectasis and/or consolidation. Mild left basilar atelectasis. Aortic Atherosclerosis (ICD10-I70.0). Electronically Signed   By: 10/01/2019 DO   On: 10/02/2019 13:09   ECHOCARDIOGRAM COMPLETE  Result Date: 10/02/2019  ECHOCARDIOGRAM REPORT   Patient Name:   Jessica [name] Date of Exam: 10/02/2019 Medical Rec #:  [id number]         Height:       70.0 in Accession #:    [id number]        Weight:       216.7 lb Date of Birth:  04/15/1945         BSA:          2.160 m Patient Age:    74 years          BP:           131/61 mmHg Patient Gender: F                 HR:           96 bpm. Exam Location:  [facility] Procedure: 2D Echo, Cardiac Doppler and Color Doppler STAT ECHO Indications:     CHF-acute diastolic 428.31  History:          Patient has prior history of Echocardiogram examinations, most                  recent 02/04/2017. Risk Factors:Hypertension and Dyslipidemia.                  Diastolic heart failure.  Sonographer:     Cristela BlueJerry Hege RDCS (AE) Referring Phys:  [id number] Vida RiggerFUAD Mirha Brucato Diagnosing Phys: Lorine BearsMuhammad Arida MD  Sonographer Comments: Technically challenging study due to limited acoustic windows and no apical window. IMPRESSIONS  1. Left ventricular ejection fraction, by estimation, is 55 to 60%. The left ventricle has normal function. Left ventricular endocardial border not optimally defined to evaluate regional wall motion. Left ventricular diastolic function could not be evaluated.  2. Right ventricular systolic function is normal. The right ventricular size is normal. Tricuspid regurgitation signal is inadequate for assessing PA pressure.  3. The mitral valve is normal in structure. No evidence of mitral valve regurgitation. No evidence of mitral stenosis.  4. The aortic valve is normal in structure. Aortic valve regurgitation is not visualized. Mild to moderate aortic valve sclerosis/calcification is present, without any evidence of aortic stenosis.  5. Technically challenging study due to limited acoustic windows and no apical window. FINDINGS  Left Ventricle: Left ventricular ejection fraction, by estimation, is 55 to 60%. The left ventricle has normal function. Left ventricular endocardial border not optimally defined to evaluate regional wall motion. The left ventricular internal cavity size was normal in size. There is no left ventricular hypertrophy. Left ventricular diastolic function could not be evaluated. Right Ventricle: The right ventricular size is normal. No increase in right ventricular wall thickness. Right ventricular systolic function is normal. Tricuspid regurgitation signal is inadequate for assessing PA pressure. The tricuspid regurgitant velocity is 2.55 m/s, and with an assumed right atrial  pressure of 10 mmHg, the estimated right ventricular systolic pressure is 36.0 mmHg. Left Atrium: Left atrial size was normal in size. Right Atrium: Right atrial size was normal in size. Pericardium: There is no evidence of pericardial effusion. Mitral Valve: The mitral valve is normal in structure. Normal mobility of the mitral valve leaflets. No evidence of mitral valve regurgitation. No evidence of mitral valve stenosis. Tricuspid Valve: The tricuspid valve is normal in structure. Tricuspid valve regurgitation is trivial. No evidence of tricuspid stenosis. Aortic Valve: The aortic valve is normal in structure. Aortic valve regurgitation is not visualized. Mild to moderate aortic valve sclerosis/calcification is present, without  any evidence of aortic stenosis. Pulmonic Valve: The pulmonic valve was normal in structure. Pulmonic valve regurgitation is not visualized. No evidence of pulmonic stenosis. Aorta: The aortic root is normal in size and structure. Venous: The inferior vena cava was not well visualized. IAS/Shunts: No atrial level shunt detected by color flow Doppler.  LEFT VENTRICLE PLAX 2D LVIDd:         5.04 cm LVIDs:         2.70 cm LV PW:         0.93 cm LV IVS:        0.83 cm LVOT diam:     2.30 cm LVOT Area:     4.15 cm  LEFT ATRIUM         Index LA diam:    2.30 cm 1.06 cm/m                        PULMONIC VALVE AORTA                 PV Vmax:        0.99 m/s Ao Root diam: 3.20 cm PV Peak grad:   3.9 mmHg                       RVOT Peak grad: 7 mmHg  TRICUSPID VALVE TR Peak grad:   26.0 mmHg TR Vmax:        255.00 cm/s  SHUNTS Systemic Diam: 2.30 cm Lorine Bears MD Electronically signed by Lorine Bears MD Signature Date/Time: 10/02/2019/1:31:07 PM    Final        ASSESSMENT AND PLAN    -Multidisciplinary rounds held today  Acute Hypoxic Respiratory Failure -due to pulmonary edema cannot rule out infectious infiltrate  - continue ancef -patient diuresed well today with slight  improvement -CXR reviewed with b/l pulm edema   Acute on Chronic diastolic CHF -repeat TTE today  -diuresed well during dayshift today oxygen as needed ICU telemetry monitoring   Moderate protein calorie malnutrition -albumin low -low peripheral muscle bulk  -dietary consultation   Altered mental status with encephalopathy  - likely due to septic nephropathy   -treat underlying etiology  - discussed with family at bedside - no hx of substance abuse or dementia   Septic shock -present on admission  -due to UTI with ecoli   - contineu Ancef  Sacral decubitus ulceration    -wound care consult - appreciate input                   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 Patient is followed by the outpatient wound care center at [facility], last seen 08/15/19.  Wound type: Pressure Pressure Injury POA: Yes Measurement: 5cm x 3cm x 1.6cm with 2cm undermining at 12 o'clock.   ID -continue IV abx as prescibed -follow up cultures  GI/Nutrition GI PROPHYLAXIS as indicated DIET-->TF's as tolerated Constipation protocol as indicated  ENDO - ICU hypoglycemic\Hyperglycemia protocol -check FSBS per protocol   ELECTROLYTES -follow labs as needed -replace as needed -pharmacy consultation   DVT/GI PRX ordered -SCDs  TRANSFUSIONS AS NEEDED MONITOR FSBS ASSESS the need for LABS as needed   Critical care provider statement:    Critical care time (minutes):  34   Critical care time was exclusive of:  Separately billable procedures and treating other patients   Critical care was necessary to treat or prevent imminent or life-threatening deterioration of  the following conditions:  AMS with septic encephalopathy, UTI sepsis, distolic CHF, acute hypoxemia, multiple comorbid conditions   Critical care was time spent personally by me on the following activities:  Development of treatment plan with patient or surrogate, discussions with consultants, evaluation of patient's response  to treatment, examination of patient, obtaining history from patient or surrogate, ordering and performing treatments and interventions, ordering and review of laboratory studies and re-evaluation of patient's condition.  I assumed direction of critical care for this patient from another provider in my specialty: no    This document was prepared using Dragon voice recognition software and may include unintentional dictation errors.    Vida Rigger, M.D.  Division of Pulmonary & Critical Care Medicine  [facility]

## 2019-10-02 NOTE — Progress Notes (Signed)
Late note-1236 Patient with Speech Therapist Hapi- Had run of SVT rate of 150, oxygen saturation dropped to 60%. Patient had short period of apnea but resumed breathing on her own. Patient briefly had a blank stare but responded shortly. Oxygen saturations still only increased to 80%. Dr.Aleskerov paged and in to see patient. STAT EKG and Echo cardiogram. Placed on high flow oxygen per RT and given a META NEB with saline. Lasix 40 mg was also ordered and given. At no time did the patient appear to be in respiratory distress or did she show any signs of cyanosis ie mucous membranes, skin etc.1400 Urine output from Lasix 800 mls.. Oxygen saturation increased to 99%. Oxygen weaned . Patient resting. Respirations 15 per minute.

## 2019-10-03 ENCOUNTER — Inpatient Hospital Stay: Payer: Medicare Other

## 2019-10-03 ENCOUNTER — Other Ambulatory Visit: Payer: Self-pay

## 2019-10-03 DIAGNOSIS — J9601 Acute respiratory failure with hypoxia: Secondary | ICD-10-CM

## 2019-10-03 DIAGNOSIS — J9621 Acute and chronic respiratory failure with hypoxia: Secondary | ICD-10-CM

## 2019-10-03 DIAGNOSIS — I5033 Acute on chronic diastolic (congestive) heart failure: Secondary | ICD-10-CM

## 2019-10-03 DIAGNOSIS — I5031 Acute diastolic (congestive) heart failure: Secondary | ICD-10-CM

## 2019-10-03 LAB — CULTURE, BLOOD (ROUTINE X 2)

## 2019-10-03 LAB — BASIC METABOLIC PANEL
Anion gap: 9 (ref 5–15)
BUN: 6 mg/dL — ABNORMAL LOW (ref 8–23)
CO2: 31 mmol/L (ref 22–32)
Calcium: 8.1 mg/dL — ABNORMAL LOW (ref 8.9–10.3)
Chloride: 101 mmol/L (ref 98–111)
Creatinine, Ser: 0.65 mg/dL (ref 0.44–1.00)
GFR calc Af Amer: >60 mL/min (ref >60)
GFR calc non Af Amer: >60 mL/min (ref >60)
Glucose, Bld: 85 mg/dL (ref 70–99)
Potassium: 4.3 mmol/L (ref 3.5–5.1)
Sodium: 141 mmol/L (ref 135–145)

## 2019-10-03 LAB — MAGNESIUM: Magnesium: 2 mg/dL (ref 1.7–2.4)

## 2019-10-03 LAB — PHOSPHORUS: Phosphorus: 3.2 mg/dL (ref 2.5–4.6)

## 2019-10-03 MED ORDER — FLUDROCORTISONE ACETATE 0.1 MG PO TABS
0.1000 mg | ORAL_TABLET | Freq: Every day | ORAL | Status: DC
  Administered 2019-10-03 – 2019-10-05 (×3): 0.1 mg via ORAL
  Filled 2019-10-03 (×3): qty 1

## 2019-10-03 MED ORDER — ALBUMIN HUMAN 25 % IV SOLN
12.5000 g | Freq: Once | INTRAVENOUS | Status: AC
  Administered 2019-10-03: 12.5 g via INTRAVENOUS
  Filled 2019-10-03: qty 50

## 2019-10-03 MED ORDER — FUROSEMIDE 10 MG/ML IJ SOLN
20.0000 mg | Freq: Every day | INTRAMUSCULAR | Status: DC
  Administered 2019-10-03 – 2019-10-05 (×3): 20 mg via INTRAVENOUS
  Filled 2019-10-03 (×3): qty 2

## 2019-10-03 NOTE — Progress Notes (Signed)
PROGRESS NOTE    Jessica [name]  SNK:[id number] DOB: 1945-07-25 DOA: 09/29/2019 PCP: Clovia Cuff, MD    Chief complaint.  Shortness of breath.  Brief Narrative:  Jessica [name] an 74 y.o.femalewith a PMH ofChronic pain, Diastolic CHF, Hyperlipidemia, Hypertension, Hypothyroidism, Morbid obesity, chronic indwelling Urinary catheter use, recurrent UTI, spinal stenosis and visual hallucinationadmitted with severe sepsis with septic shock with lactic acidosis secondary to catheter associated urinary tract infection.  Has been bedbound baseline.  She also has a chronic Foley catheter due to large decubitus ulcer in the sacrum.  6/20.  Sepsis improved, lactic acid level normalized.  IV fluids discontinued.  Patient has been fed by family, has good appetite.  Pending wound care evaluation for sacrum wound, continue antibiotics for UTI.  Family is not interested in palliative care.  6/21.  Patient developed acute respite failure secondary to volume overload.  Was seen by ICU, giving IV Lasix.  Blood culture and urine culture positive positive for E. coli.  Antibiotic was switched to Cefzolin.  6/22.  Shortness of breath much improved today.  Continue IV Lasix.    Assessment & Plan:   Active Problems:   Decubitus ulcer of sacral region, stage 4 (HCC)   Severe sepsis with septic shock (HCC)   Pressure injury of skin   E. coli septicemia (HCC)   UTI (urinary tract infection) due to urinary indwelling catheter ([place])  #1.  Acute hypoxemic respite failure secondary to acute on chronic diastolic congestive heart failure. Repeated echocardiogram showed ejection fraction 55 to 60%.  Patient is on lower dose IV Lasix, he has been diuresing well.  Short of breath improving.  2.  Septic shock with E. coli septicemia.  Appears to be secondary to urinary tract infection. Antibiotic switched to cefazolin.  May use Bactrim DC at discharge.  3.  Likely urinary tract infection secondary to  indwelling Foley catheter. Urine culture had multiple bacteria including E. Coli.  E. coli probably is a true infection, other bacteria are probably colonization.  4.  Sacral decubitus ulcer.  Stage IV. Evaluated by wound care, no evidence of infection.  5.  Acute kidney injury secondary to septic shock. Renal function stable.  6.  Severe protein calorie malnutrition. Appetite improving.    7.  Hypoglycemia. Condition improved.  DVT prophylaxis: Heparin Code Status: Full Family Communication: Discussed with the patient and son. Disposition Plan:   Patient came from: Home                                                                                                               Anticipated d/c place: Home  Barriers to d/c OR conditions which need to be met to effect a safe d/c:   Consultants:   Wound care  Procedures: None Antimicrobials:  Cefazolin.       Subjective: Patient feels better today.  Still on oxygen.  Short of breath with exertion. Good appetite without nausea vomiting. No abdominal pain. No fever chills.  Objective: Vitals:   10/03/19 0700 10/03/19  1093 10/03/19 1000 10/03/19 1100  BP: (!) 104/47  (!) 122/51 (!) 96/42  Pulse: 67 (!) 105  77  Resp: 16 (!) '29 16 14  '$ Temp:      TempSrc:      SpO2: 97%   93%  Weight:      Height:        Intake/Output Summary (Last 24 hours) at 10/03/2019 1428 Last data filed at 10/03/2019 0650 Gross per 24 hour  Intake 114.9 ml  Output 1100 ml  Net -985.1 ml   Filed Weights   10/01/19 0454 10/02/19 0500 10/03/19 0500  Weight: 96.6 kg 98.3 kg 96.1 kg    Examination:  General exam: Appears calm and comfortable  Respiratory system: Crackles in the base. Respiratory effort normal. Cardiovascular system: S1 & S2 heard, RRR. No JVD, murmurs, rubs, gallops or clicks. 3+ lateral lower extremity edema Gastrointestinal system: Abdomen is nondistended, soft and nontender. No organomegaly or masses felt.  Normal bowel sounds heard. Central nervous system: Alert and oriented x2. No focal neurological deficits. Extremities: Symmetric  Skin: No rashes, lesions or ulcers Psychiatry: Judgement and insight appear normal. Mood & affect appropriate.     Data Reviewed: I have personally reviewed following labs and imaging studies  CBC: Recent Labs  Lab 09/29/19 0840 09/30/19 0443 10/01/19 0428  WBC 22.3* 22.0* 8.7  NEUTROABS 17.9*  --   --   HGB 12.3 11.5* 10.1*  HCT 36.2 33.4* 29.7*  MCV 81.5 80.3 81.1  PLT 276 255 235   Basic Metabolic Panel: Recent Labs  Lab 09/29/19 0840 09/30/19 0443 10/01/19 0428 10/03/19 1123  NA 131* 134* 134* 141  K 3.4* 3.1* 3.5 4.3  CL 89* 95* 97* 101  CO2 '28 28 26 31  '$ GLUCOSE 108* 90 67* 85  BUN '14 13 12 '$ 6*  CREATININE 1.10* 0.83 0.78 0.65  CALCIUM 7.8* 7.8* 8.0* 8.1*  MG  --  1.3* 2.6* 2.0  PHOS  --  3.4 2.8 3.2   GFR: Estimated Creatinine Clearance: 79.8 mL/min (by C-G formula based on SCr of 0.65 mg/dL). Liver Function Tests: Recent Labs  Lab 09/29/19 0840 09/30/19 0443 10/01/19 0428  AST 54* 44* 39  ALT '28 21 19  '$ ALKPHOS 174* 154* 132*  BILITOT 1.8* 2.0* 1.8*  PROT 5.2* 5.1* 4.8*  ALBUMIN 1.6* 2.0* 2.4*   No results for input(s): LIPASE, AMYLASE in the last 168 hours. No results for input(s): AMMONIA in the last 168 hours. Coagulation Profile: Recent Labs  Lab 09/29/19 0840  INR 1.4*   Cardiac Enzymes: No results for input(s): CKTOTAL, CKMB, CKMBINDEX, TROPONINI in the last 168 hours. BNP (last 3 results) No results for input(s): PROBNP in the last 8760 hours. HbA1C: No results for input(s): HGBA1C in the last 72 hours. CBG: Recent Labs  Lab 09/29/19 1436 09/29/19 1438 10/01/19 0545 10/01/19 0620 10/01/19 0734  GLUCAP 49* 130* 56* 63* 95   Lipid Profile: No results for input(s): CHOL, HDL, LDLCALC, TRIG, CHOLHDL, LDLDIRECT in the last 72 hours. Thyroid Function Tests: No results for input(s): TSH, T4TOTAL,  FREET4, T3FREE, THYROIDAB in the last 72 hours. Anemia Panel: No results for input(s): VITAMINB12, FOLATE, FERRITIN, TIBC, IRON, RETICCTPCT in the last 72 hours. Sepsis Labs: Recent Labs  Lab 09/29/19 0841 09/29/19 1118 09/30/19 1312 10/01/19 0428  LATICACIDVEN 3.2* 3.2* 1.2 0.9    Recent Results (from the past 240 hour(s))  Urine Culture     Status: None   Collection Time: 09/29/19  5:30 AM  UR  Result Value Ref Range Status   Urine Culture, Routine Final report  Final   Organism ID, Bacteria Comment  Final    Comment: Greater than 2 organisms recovered, none predominant. Please submit another sample if clinically indicated. Greater than 100,000 colony forming units per mL   Culture, blood (Routine x 2)     Status: None (Preliminary result)   Collection Time: 09/29/19  8:41 AM   Specimen: BLOOD  Result Value Ref Range Status   Specimen Description BLOOD LEFT WRIST  Final   Special Requests   Final    BOTTLES DRAWN AEROBIC AND ANAEROBIC Blood Culture adequate volume   Culture   Final    NO GROWTH 4 DAYS Performed at [facility], [address] [place], [place] [address]    Report Status PENDING  Incomplete  Culture, blood (Routine x 2)     Status: Abnormal   Collection Time: 09/29/19  8:41 AM   Specimen: BLOOD RIGHT HAND  Result Value Ref Range Status   Specimen Description   Final    BLOOD RIGHT HAND Performed at [facility], [place] [address] [place], [place] [address]    Special Requests   Final    BOTTLES DRAWN AEROBIC AND ANAEROBIC Blood Culture results may not be optimal due to an inadequate volume of blood received in culture bottles Performed at [facility], [address], [place], [place] [address]    Culture  Setup Time   Final    GRAM NEGATIVE RODS IN BOTH AEROBIC AND ANAEROBIC BOTTLES CRITICAL RESULT CALLED TO, READ BACK BY AND VERIFIED WITH: DAVID BESANTI 09/29/2019 1128 TTG Performed at [facility], [place] [address], [place], [place] [address]    Culture ESCHERICHIA COLI (A)  Final   Report Status 10/03/2019 FINAL  Final   Organism ID, Bacteria ESCHERICHIA COLI  Final      Susceptibility   Escherichia coli - MIC*    AMPICILLIN >=32 RESISTANT Resistant     CEFAZOLIN <=4 SENSITIVE Sensitive     CEFEPIME <=0.12 SENSITIVE Sensitive     CEFTAZIDIME <=1 SENSITIVE Sensitive     CEFTRIAXONE <=0.25 SENSITIVE Sensitive     CIPROFLOXACIN <=0.25 SENSITIVE Sensitive     GENTAMICIN <=1 SENSITIVE Sensitive     IMIPENEM <=0.25 SENSITIVE Sensitive     TRIMETH/SULFA <=20 SENSITIVE Sensitive     AMPICILLIN/SULBACTAM 16 INTERMEDIATE Intermediate     PIP/TAZO <=4 SENSITIVE Sensitive     * ESCHERICHIA COLI  Blood Culture ID Panel (Reflexed)     Status: Abnormal   Collection Time: 09/29/19  8:41 AM  Result Value Ref Range Status   Enterococcus species NOT DETECTED NOT DETECTED Final   Listeria monocytogenes NOT DETECTED NOT DETECTED Final   Staphylococcus species NOT DETECTED NOT DETECTED Final   Staphylococcus aureus (BCID) NOT DETECTED NOT DETECTED Final   Streptococcus species NOT DETECTED NOT DETECTED Final   Streptococcus agalactiae NOT DETECTED NOT DETECTED Final   Streptococcus pneumoniae NOT DETECTED NOT DETECTED Final   Streptococcus pyogenes NOT DETECTED NOT DETECTED Final   Acinetobacter baumannii NOT DETECTED NOT DETECTED Final   Enterobacteriaceae species DETECTED (A) NOT DETECTED Final    Comment: Enterobacteriaceae represent a large family of gram-negative bacteria, not a single organism. CRITICAL RESULT CALLED TO, READ BACK BY AND VERIFIED WITH: DAVID BASANTI 09/30/2019 2348 TTG    Enterobacter cloacae complex NOT DETECTED NOT DETECTED Final   Escherichia coli DETECTED (A) NOT DETECTED Final  Comment: CRITICAL RESULT CALLED TO, READ BACK BY AND VERIFIED WITH: DAVID BASANTI 09/29/2019 '@2348'$     Klebsiella oxytoca NOT DETECTED NOT DETECTED Final   Klebsiella pneumoniae NOT DETECTED NOT DETECTED  Final   Proteus species NOT DETECTED NOT DETECTED Final   Serratia marcescens NOT DETECTED NOT DETECTED Final   Carbapenem resistance NOT DETECTED NOT DETECTED Final   Haemophilus influenzae NOT DETECTED NOT DETECTED Final   Neisseria meningitidis NOT DETECTED NOT DETECTED Final   Pseudomonas aeruginosa NOT DETECTED NOT DETECTED Final   Candida albicans NOT DETECTED NOT DETECTED Final   Candida glabrata NOT DETECTED NOT DETECTED Final   Candida krusei NOT DETECTED NOT DETECTED Final   Candida parapsilosis NOT DETECTED NOT DETECTED Final   Candida tropicalis NOT DETECTED NOT DETECTED Final    Comment: Performed at [facility], [address] [place], [place] [address]  SARS Coronavirus 2 by RT PCR (hospital order, performed in [place] hospital lab) Nasopharyngeal Nasopharyngeal Swab     Status: None   Collection Time: 09/29/19 11:50 AM   Specimen: Nasopharyngeal Swab  Result Value Ref Range Status   SARS Coronavirus 2 NEGATIVE NEGATIVE Final    Comment: (NOTE) SARS-CoV-2 target nucleic acids are NOT DETECTED.  The SARS-CoV-2 RNA is generally detectable in upper and lower respiratory specimens during the acute phase of infection. The lowest concentration of SARS-CoV-2 viral copies this assay can detect is 250 copies / mL. A negative result does not preclude SARS-CoV-2 infection and should not be used as the sole basis for treatment or other patient management decisions.  A negative result may occur with improper specimen collection / handling, submission of specimen other than nasopharyngeal swab, presence of viral mutation(s) within the areas targeted by this assay, and inadequate number of viral copies (<250 copies / mL). A negative result must be combined with clinical observations, patient history, and epidemiological information.  Fact Sheet for Patients:   StrictlyIdeas.no  Fact Sheet for Healthcare  Providers: BankingDealers.co.za  This test is not yet approved or  cleared by the [place] FDA and has been authorized for detection and/or diagnosis of SARS-CoV-2 by FDA under an Emergency Use Authorization (EUA).  This EUA will remain in effect (meaning this test can be used) for the duration of the COVID-19 declaration under Section 564(b)(1) of the Act, 21 U.S.C. section 360bbb-3(b)(1), unless the authorization is terminated or revoked sooner.  Performed at [facility], [address] [place], [place] [address]   MRSA PCR Screening     Status: None   Collection Time: 09/29/19  2:51 PM   Specimen: Nasal Mucosa; Nasopharyngeal  Result Value Ref Range Status   MRSA by PCR NEGATIVE NEGATIVE Final    Comment:        The GeneXpert MRSA Assay (FDA approved for NASAL specimens only), is one component of a comprehensive MRSA colonization surveillance program. It is not intended to diagnose MRSA infection nor to guide or monitor treatment for MRSA infections. Performed at [facility], [address], [place], [place] [address]   Urine Culture     Status: Abnormal   Collection Time: 09/29/19  2:52 PM   Specimen: Urine, Catheterized  Result Value Ref Range Status   Specimen Description   Final    URINE, CATHETERIZED Performed at [facility], [address], [place], [place] [address]    Special Requests   Final    Immunocompromised Performed at [facility], [place], [place]  [address]    Culture (A)  Final    >=100,000 COLONIES/mL ENTEROCOCCUS FAECALIS 60,000 COLONIES/mL ESCHERICHIA COLI    Report Status 10/02/2019 FINAL  Final   Organism ID, Bacteria ENTEROCOCCUS FAECALIS (A)  Final   Organism ID, Bacteria ESCHERICHIA COLI (A)  Final      Susceptibility   Escherichia coli - MIC*    AMPICILLIN >=32 RESISTANT Resistant     CEFAZOLIN <=4 SENSITIVE Sensitive     CEFTRIAXONE <=0.25  SENSITIVE Sensitive     CIPROFLOXACIN <=0.25 SENSITIVE Sensitive     GENTAMICIN <=1 SENSITIVE Sensitive     IMIPENEM <=0.25 SENSITIVE Sensitive     NITROFURANTOIN <=16 SENSITIVE Sensitive     TRIMETH/SULFA <=20 SENSITIVE Sensitive     AMPICILLIN/SULBACTAM 16 INTERMEDIATE Intermediate     PIP/TAZO <=4 SENSITIVE Sensitive     * 60,000 COLONIES/mL ESCHERICHIA COLI   Enterococcus faecalis - MIC*    AMPICILLIN <=2 SENSITIVE Sensitive     NITROFURANTOIN <=16 SENSITIVE Sensitive     VANCOMYCIN 1 SENSITIVE Sensitive     * >=100,000 COLONIES/mL ENTEROCOCCUS FAECALIS         Radiology Studies: DG Chest Port 1 View  Result Date: 10/03/2019 CLINICAL DATA:  Acute respiratory failure, hypoxia. EXAM: PORTABLE CHEST 1 VIEW COMPARISON:  October 02, 2019. FINDINGS: Stable cardiomegaly. No pneumothorax is noted. Stable mild right pleural effusion is noted with associated right basilar atelectasis or infiltrate. Stable left basilar subsegmental atelectasis. Bony thorax is unremarkable. IMPRESSION: Stable mild right pleural effusion with associated right basilar atelectasis or infiltrate. Stable left basilar subsegmental atelectasis. Electronically Signed   By: Marijo Conception M.D.   On: 10/03/2019 10:27   DG Chest Port 1 View  Result Date: 10/02/2019 CLINICAL DATA:  Acute hypoxemic respiratory failure. Additional provided: Hypertension, diastolic heart failure. EXAM: PORTABLE CHEST 1 VIEW COMPARISON:  Prior chest radiograph 09/29/2019 and earlier FINDINGS: Mild cardiomegaly, unchanged. Aortic atherosclerosis. Central pulmonary vascular congestion. There is a small to moderate-sized right pleural effusion with associated right basilar atelectasis and/or consolidation. No definite left pleural effusion. Mild left basilar atelectasis. No evidence of pneumothorax. No acute bony abnormality IMPRESSION: Unchanged mild cardiomegaly.  Central pulmonary vascular congestion. Small to moderate-sized right pleural effusion  with associated right basilar atelectasis and/or consolidation. Mild left basilar atelectasis. Aortic Atherosclerosis (ICD10-I70.0). Electronically Signed   By: Kellie Simmering DO   On: 10/02/2019 13:09   ECHOCARDIOGRAM COMPLETE  Result Date: 10/02/2019    ECHOCARDIOGRAM REPORT   Patient Name:   LORRE OPDAHL [facility] Date of Exam: 10/02/2019 Medical Rec #:  [id number]         Height:       70.0 in Accession #:    [id number]        Weight:       216.7 lb Date of Birth:  Oct 14, 1945         BSA:          2.160 m Patient Age:    52 years          BP:           131/61 mmHg Patient Gender: F                 HR:           96 bpm. Exam Location:  [facility] Procedure: 2D Echo, Cardiac Doppler and Color Doppler STAT ECHO Indications:     CHF-acute diastolic 884.16  History:         Patient has  prior history of Echocardiogram examinations, most                  recent 02/04/2017. Risk Factors:Hypertension and Dyslipidemia.                  Diastolic heart failure.  Sonographer:     Sherrie Sport RDCS (AE) Referring Phys:  [id number] Ottie Glazier Diagnosing Phys: Kathlyn Sacramento MD  Sonographer Comments: Technically challenging study due to limited acoustic windows and no apical window. IMPRESSIONS  1. Left ventricular ejection fraction, by estimation, is 55 to 60%. The left ventricle has normal function. Left ventricular endocardial border not optimally defined to evaluate regional wall motion. Left ventricular diastolic function could not be evaluated.  2. Right ventricular systolic function is normal. The right ventricular size is normal. Tricuspid regurgitation signal is inadequate for assessing PA pressure.  3. The mitral valve is normal in structure. No evidence of mitral valve regurgitation. No evidence of mitral stenosis.  4. The aortic valve is normal in structure. Aortic valve regurgitation is not visualized. Mild to moderate aortic valve sclerosis/calcification is present, without any evidence of aortic stenosis.  5. Technically  challenging study due to limited acoustic windows and no apical window. FINDINGS  Left Ventricle: Left ventricular ejection fraction, by estimation, is 55 to 60%. The left ventricle has normal function. Left ventricular endocardial border not optimally defined to evaluate regional wall motion. The left ventricular internal cavity size was normal in size. There is no left ventricular hypertrophy. Left ventricular diastolic function could not be evaluated. Right Ventricle: The right ventricular size is normal. No increase in right ventricular wall thickness. Right ventricular systolic function is normal. Tricuspid regurgitation signal is inadequate for assessing PA pressure. The tricuspid regurgitant velocity is 2.55 m/s, and with an assumed right atrial pressure of 10 mmHg, the estimated right ventricular systolic pressure is 37.1 mmHg. Left Atrium: Left atrial size was normal in size. Right Atrium: Right atrial size was normal in size. Pericardium: There is no evidence of pericardial effusion. Mitral Valve: The mitral valve is normal in structure. Normal mobility of the mitral valve leaflets. No evidence of mitral valve regurgitation. No evidence of mitral valve stenosis. Tricuspid Valve: The tricuspid valve is normal in structure. Tricuspid valve regurgitation is trivial. No evidence of tricuspid stenosis. Aortic Valve: The aortic valve is normal in structure. Aortic valve regurgitation is not visualized. Mild to moderate aortic valve sclerosis/calcification is present, without any evidence of aortic stenosis. Pulmonic Valve: The pulmonic valve was normal in structure. Pulmonic valve regurgitation is not visualized. No evidence of pulmonic stenosis. Aorta: The aortic root is normal in size and structure. Venous: The inferior vena cava was not well visualized. IAS/Shunts: No atrial level shunt detected by color flow Doppler.  LEFT VENTRICLE PLAX 2D LVIDd:         5.04 cm LVIDs:         2.70 cm LV PW:         0.93 cm  LV IVS:        0.83 cm LVOT diam:     2.30 cm LVOT Area:     4.15 cm  LEFT ATRIUM         Index LA diam:    2.30 cm 1.06 cm/m                        PULMONIC VALVE AORTA  PV Vmax:        0.99 m/s Ao Root diam: 3.20 cm PV Peak grad:   3.9 mmHg                       RVOT Peak grad: 7 mmHg  TRICUSPID VALVE TR Peak grad:   26.0 mmHg TR Vmax:        255.00 cm/s  SHUNTS Systemic Diam: 2.30 cm Kathlyn Sacramento MD Electronically signed by Kathlyn Sacramento MD Signature Date/Time: 10/02/2019/1:31:07 PM    Final    US Abdomen Limited RUQ  Result Date: 10/03/2019 CLINICAL DATA:  Upper abdominal pain, elevated LFTs, cirrhosis EXAM: ULTRASOUND ABDOMEN LIMITED RIGHT UPPER QUADRANT COMPARISON:  03/12/2017 CT abdomen FINDINGS: Gallbladder: Small amount of dependent echogenic material without shadowing in gallbladder, question sludge versus tiny nonshadowing calculi. No definite shadowing calculi, gallbladder wall thickening, pericholecystic fluid, or sonographic Murphy sign. Common bile duct: Diameter: 3 mm, normal Liver: Heterogeneous increased echogenicity of the liver with nodular hepatic contour consistent with cirrhosis. No focal hepatic mass is identified though assessment of intrahepatic detail is limited by the degree of sound attenuation. Portal vein is patent on color Doppler imaging with normal direction of blood flow towards the liver. Other: Small amount of perihepatic free fluid. IMPRESSION: Minimal dependent sludge versus tiny nonshadowing calculi within gallbladder. Cirrhotic appearing liver without definite mass though intrahepatic assessment is limited by sound attenuation; if better intra hepatic visualization is required recommend MR assessment. Minimal perihepatic ascites. Electronically Signed   By: Lavonia Dana M.D.   On: 10/03/2019 12:56        Scheduled Meds: . baclofen  5 mg Oral TID  . Chlorhexidine Gluconate Cloth  6 each Topical Daily  . fludrocortisone  0.1 mg Oral Daily  .  furosemide  20 mg Intravenous Daily  . heparin injection (subcutaneous)  5,000 Units Subcutaneous Q12H  . levothyroxine  150 mcg Oral Q0600  . pantoprazole (PROTONIX) IV  40 mg Intravenous QHS  . pregabalin  100 mg Oral TID  . vitamin B-12  1,000 mcg Oral Daily  . zinc oxide   Topical BID   Continuous Infusions: . sodium chloride Stopped (10/02/19 1443)  .  ceFAZolin (ANCEF) IV 2 g (10/03/19 0720)  . norepinephrine (LEVOPHED) Adult infusion Stopped (09/30/19 0907)     LOS: 4 days    Time spent: 28 minutes    Sharen Hones, MD [facility]   To contact the attending provider between 7A-7P or the covering provider during after hours 7P-7A, please log into the web site [email or web address] and access using universal [place] password for that web site. If you do not have the password, please call the hospital operator.  10/03/2019, 2:28 PM

## 2019-10-03 NOTE — Progress Notes (Signed)
   10/03/19 0730  Clinical Encounter Type  Visited With Patient and family together  Visit Type Follow-up  Referral From Chaplain  Consult/Referral To Chaplain  Chaplain received a chat message yesterday that family could benefit from a visit. When chaplain arrived son, [name], was at bedside holding patient's hand and husband was getting out of the chair walking toward the bed. Family told chaplain that yesterday was rough. They told her about the amount of fluid that was taken from patient. Patient is doing better today. She responded with her eyes when son and husband said something to her. The family is upbeat at this time and thanked chaplain for coming by. Chaplain will follow up later today.

## 2019-10-03 NOTE — Progress Notes (Addendum)
   10/03/19 1200  Clinical Encounter Type  Visited With Patient and family together  Visit Type Follow-up  Referral From Chaplain  Consult/Referral To Chaplain  While walking the hall of ICU, chaplain noticed son was feeding patient. When chaplain stopped in and commented, son said patient wanted breakfast and they were trying to get it for her, but she was at least eating the mashed potatoes. The son brought up a discussion about how older people are treated in [place]. The son said a [place] doctor looked at the way he treats his mother and couldn't believe it because older people are not revered in the [place] as they are in other countries. Chaplain mentioned that her father was 80 yrs old when he died and that she loves older people. She said they have so much wisdom and we can learn a lot from them.The doctor and nurse came in ard 12:12 and chaplain left.  Later in the hall husband stopped and talked with chaplain, saying he has to go to [facility] to pick up a handicap plaque. Husband also told chaplain how patient developed a bed sore after being in the hospital for nearly one month. Husband said son does most of the talking. Son looks up information, but together they are taking good care of patient at home. He was unhappy with the way one nurse was taking care of patient's bedsore. Chaplain asked if he mentioned anything to the nurse and if they told the nurse how the wound doctor instructed them to care for the would.   Chaplain could tell that the husband needed to talk and she was glad she was available.

## 2019-10-03 NOTE — Progress Notes (Signed)
PHARMACY CONSULT NOTE  Pharmacy Consult for Electrolyte Monitoring and Replacement   Recent Labs: Potassium (mmol/L)  Date Value  10/03/2019 4.3   Magnesium (mg/dL)  Date Value  82/70/7867 2.0   Calcium (mg/dL)  Date Value  54/49/2010 8.1 (L)   Albumin (g/dL)  Date Value  10/22/1973 2.4 (L)   Phosphorus (mg/dL)  Date Value  88/32/5498 3.2   Sodium (mmol/L)  Date Value  10/03/2019 141     Assessment: 74 year old female with E.coli bacteremia. Pharmacy consulted for electrolyte replacement.  Goal of Therapy:  Electrolytes WNL  Plan:  Electrolytes WNL. Will defer daily monitoring unless clinically indicated.  Pricilla Riffle ,PharmD Clinical Pharmacist 10/03/2019 2:06 PM

## 2019-10-03 NOTE — Progress Notes (Addendum)
Runs of SVT rate of 130's to 170's. Patient was totally asymptomatic. Dr Karna Christmas notified. No orders received. Patients heart rate decreased on its own. Good day other than SVT. More alert and talkative. Ate well. Talked a lot except to Dr. Karna Christmas. Consult to PM&R physician for possible autonomic cause for runs of SVT. Family in with patient all day. Sacral wound dressed at 1030 today.

## 2019-10-03 NOTE — Progress Notes (Signed)
CRITICAL CARE PROGRESS NOTE    Name: Jessica [name] MRN: [id number] DOB: 1946-02-28     LOS: 4   SUBJECTIVE FINDINGS & SIGNIFICANT EVENTS   Patient description:  Jessica [name] an 74 y.o.femalewith a PMH ofChronic pain, Diastolic CHF, Hyperlipidemia, Hypertension, Hypothyroidism, Morbid obesity, chronic indwelling Urinary catheter use, recurrent UTI, spinal stenosis and visual hallucinationadmitted with severe sepsis with septic shock with lactic acidosis secondary to catheter associated urinary tract infection.  Has been bedbound baseline.  She also has a chronic Foley catheter due to large decubitus ulcer in the sacrum.   Lines / Drains: PIVx2  Cultures / Sepsis markers: Urine culture +for Ecoli  Antibiotics: Ancef   Protocols / Consultants: Hospitalist, pccm  Tests / Events: TTE, CXR, lasix challenge  10/03/19- patient diuresed >2L in past 24h.  She is improved with decreased O2 requirement. Will continue medical mgt today with plan to transfer to [facility] in am if patient continues to improve.  PAST MEDICAL HISTORY   Past Medical History:  Diagnosis Date   Chronic pain    Depression    Diastolic heart failure (HCC)    HLD (hyperlipidemia)    HTN (hypertension)    Hypothyroidism    Morbid obesity (HCC)    Recurrent UTI    Spinal stenosis    Visual hallucinations      SURGICAL HISTORY   Past Surgical History:  Procedure Laterality Date   ABDOMINAL HYSTERECTOMY     BACK SURGERY     HEMORROIDECTOMY     IR RADIOLOGIST EVAL & MGMT  09/01/2017     FAMILY HISTORY   Family History  Problem Relation Age of Onset   Heart disease Father    Deep vein thrombosis Father      SOCIAL HISTORY   Social History   Tobacco Use   Smoking status: Never Smoker    Smokeless tobacco: Former Neurosurgeon    Types: Snuff  Vaping Use   Vaping Use: Never used  Substance Use Topics   Alcohol use: No   Drug use: No     MEDICATIONS   Current Medication:  Current Facility-Administered Medications:    0.9 %  sodium chloride infusion, 250 mL, Intravenous, Continuous, Lowella Bandy, RPH, Stopped at 10/02/19 1443   acetaminophen (TYLENOL) tablet 650 mg, 650 mg, Oral, Q4H PRN, Salena Saner, MD   albumin human 25 % solution 12.5 g, 12.5 g, Intravenous, Once, Justyce Yeater, MD   baclofen (LIORESAL) tablet 5 mg, 5 mg, Oral, TID, Salena Saner, MD, 5 mg at 10/02/19 0943   ceFAZolin (ANCEF) IVPB 2g/100 mL premix, 2 g, Intravenous, Q8H, Tjay Velazquez, MD, Last Rate: 200 mL/hr at 10/03/19 0720, 2 g at 10/03/19 0720   Chlorhexidine Gluconate Cloth 2 % PADS 6 each, 6 each, Topical, Daily, Salena Saner, MD, 6 each at 10/02/19 0948   docusate sodium (COLACE) capsule 100 mg, 100 mg, Oral, BID PRN, Salena Saner, MD   fludrocortisone (FLORINEF) tablet 0.1 mg, 0.1 mg, Oral, Daily, Micole Delehanty, MD   furosemide (LASIX) injection 20 mg, 20 mg, Intravenous, Daily, Quoc Tome, MD   heparin injection 5,000 Units, 5,000 Units, Subcutaneous, Q12H, Salena Saner, MD, 5,000 Units at 10/02/19 2258   levothyroxine (SYNTHROID) tablet 150 mcg, 150 mcg, Oral, Q0600, Salena Saner, MD, 150 mcg at 10/02/19 0557   norepinephrine (LEVOPHED)  in premix infusion, 2-10 mcg/min, Intravenous, Titrated, Salena Saner, MD, Paused at 09/30/19 0907   ondansetron (ZOFRAN)  injection 4 mg, 4 mg, Intravenous, Q6H PRN, Salena Saner, MD   pantoprazole (PROTONIX) injection 40 mg, 40 mg, Intravenous, QHS, Salena Saner, MD, 40 mg at 10/02/19 2258   polyethylene glycol (MIRALAX / GLYCOLAX) packet 17 g, 17 g, Oral, Daily PRN, Salena Saner, MD   pregabalin (LYRICA) capsule 100 mg, 100 mg, Oral, TID, Salena Saner, MD,  100 mg at 10/02/19 0233   vitamin B-12 (CYANOCOBALAMIN) tablet 1,000 mcg, 1,000 mcg, Oral, Daily, Salena Saner, MD, 1,000 mcg at 10/02/19 0943   zinc oxide (BALMEX) 11.3 % cream, , Topical, BID, Marrion Coy, MD, Given at 10/03/19 0100    ALLERGIES   Ambien [zolpidem tartrate] and Zinc    REVIEW OF SYSTEMS    10 point ROS done and is negative except as per HPI and subjective findings.   PHYSICAL EXAMINATION   Vital Signs: Temp:  [97.2 F (36.2 C)-98.4 F (36.9 C)] 97.2 F (36.2 C) (06/22 0600) Pulse Rate:  [67-109] 105 (06/22 0722) Resp:  [13-31] 29 (06/22 0722) BP: (96-131)/(42-85) 104/47 (06/22 0700) SpO2:  [86 %-100 %] 97 % (06/22 0700) Weight:  [96.1 kg] 96.1 kg (06/22 0500)  GENERAL:Chronically ill appearing HEAD: Normocephalic, atraumatic.  EYES: Pupils equal, round, reactive to light.  No scleral icterus.  MOUTH: Moist mucosal membrane. NECK: Supple. No thyromegaly. No nodules. No JVD.  PULMONARY: bilateral crackles CARDIOVASCULAR: S1 and S2. Regular rate and rhythm. No murmurs, rubs, or gallops.  GASTROINTESTINAL: Soft, nontender, non-distended. No masses. Positive bowel sounds. No hepatosplenomegaly.  MUSCULOSKELETAL: 3+ pitting lower extermity edema  NEUROLOGIC: Mild distress due to acute illness SKIN:intact,warm,dry   PERTINENT DATA     Infusions:  sodium chloride Stopped (10/02/19 1443)   albumin human      ceFAZolin (ANCEF) IV 2 g (10/03/19 0720)   norepinephrine (LEVOPHED) Adult infusion Stopped (09/30/19 4356)   Scheduled Medications:  baclofen  5 mg Oral TID   Chlorhexidine Gluconate Cloth  6 each Topical Daily   fludrocortisone  0.1 mg Oral Daily   furosemide  20 mg Intravenous Daily   heparin injection (subcutaneous)  5,000 Units Subcutaneous Q12H   levothyroxine  150 mcg Oral Q0600   pantoprazole (PROTONIX) IV  40 mg Intravenous QHS   pregabalin  100 mg Oral TID   vitamin B-12  1,000 mcg Oral Daily   zinc oxide    Topical BID   PRN Medications: acetaminophen, docusate sodium, ondansetron (ZOFRAN) IV, polyethylene glycol Hemodynamic parameters:   Intake/Output: 06/21 0701 - 06/22 0700 In: 114.9 [I.V.:14.9; IV Piggyback:100] Out: 2375 [Urine:2375]  Ventilator  Settings:     LAB RESULTS:  Basic Metabolic Panel: Recent Labs  Lab 09/29/19 0840 09/29/19 0840 09/30/19 0443 10/01/19 0428  NA 131*  --  134* 134*  K 3.4*   < > 3.1* 3.5  CL 89*  --  95* 97*  CO2 28  --  28 26  GLUCOSE 108*  --  90 67*  BUN 14  --  13 12  CREATININE 1.10*  --  0.83 0.78  CALCIUM 7.8*  --  7.8* 8.0*  MG  --   --  1.3* 2.6*  PHOS  --   --  3.4 2.8   < > = values in this interval not displayed.   Liver Function Tests: Recent Labs  Lab 09/29/19 0840 09/30/19 0443 10/01/19 0428  AST 54* 44* 39  ALT 28 21 19   ALKPHOS 174* 154* 132*  BILITOT 1.8* 2.0* 1.8*  PROT  5.2* 5.1* 4.8*  ALBUMIN 1.6* 2.0* 2.4*   No results for input(s): LIPASE, AMYLASE in the last 168 hours. No results for input(s): AMMONIA in the last 168 hours. CBC: Recent Labs  Lab 09/29/19 0840 09/30/19 0443 10/01/19 0428  WBC 22.3* 22.0* 8.7  NEUTROABS 17.9*  --   --   HGB 12.3 11.5* 10.1*  HCT 36.2 33.4* 29.7*  MCV 81.5 80.3 81.1  PLT 276 255 180   Cardiac Enzymes: No results for input(s): CKTOTAL, CKMB, CKMBINDEX, TROPONINI in the last 168 hours. BNP: Invalid input(s): POCBNP CBG: Recent Labs  Lab 09/29/19 1436 09/29/19 1438 10/01/19 0545 10/01/19 0620 10/01/19 0734  GLUCAP 49* 130* 56* 63* 95       IMAGING RESULTS:  Imaging: DG Chest Port 1 View  Result Date: 10/02/2019 CLINICAL DATA:  Acute hypoxemic respiratory failure. Additional provided: Hypertension, diastolic heart failure. EXAM: PORTABLE CHEST 1 VIEW COMPARISON:  Prior chest radiograph 09/29/2019 and earlier FINDINGS: Mild cardiomegaly, unchanged. Aortic atherosclerosis. Central pulmonary vascular congestion. There is a small to moderate-sized right  pleural effusion with associated right basilar atelectasis and/or consolidation. No definite left pleural effusion. Mild left basilar atelectasis. No evidence of pneumothorax. No acute bony abnormality IMPRESSION: Unchanged mild cardiomegaly.  Central pulmonary vascular congestion. Small to moderate-sized right pleural effusion with associated right basilar atelectasis and/or consolidation. Mild left basilar atelectasis. Aortic Atherosclerosis (ICD10-I70.0). Electronically Signed   By: Jackey Loge DO   On: 10/02/2019 13:09   ECHOCARDIOGRAM COMPLETE  Result Date: 10/02/2019    ECHOCARDIOGRAM REPORT   Patient Name:   HAZELEE HARBOLD [facility] Date of Exam: 10/02/2019 Medical Rec #:  [id number]         Height:       70.0 in Accession #:    [id number]        Weight:       216.7 lb Date of Birth:  [birth year]         BSA:          2.160 m Patient Age:    74 years          BP:           131/61 mmHg Patient Gender: F                 HR:           96 bpm. Exam Location:  [facility] Procedure: 2D Echo, Cardiac Doppler and Color Doppler STAT ECHO Indications:     CHF-acute diastolic 428.31  History:         Patient has prior history of Echocardiogram examinations, most                  recent 02/04/2017. Risk Factors:Hypertension and Dyslipidemia.                  Diastolic heart failure.  Sonographer:     Cristela Blue RDCS (AE) Referring Phys:  [id number] Vida Rigger Diagnosing Phys: Lorine Bears MD  Sonographer Comments: Technically challenging study due to limited acoustic windows and no apical window. IMPRESSIONS  1. Left ventricular ejection fraction, by estimation, is 55 to 60%. The left ventricle has normal function. Left ventricular endocardial border not optimally defined to evaluate regional wall motion. Left ventricular diastolic function could not be evaluated.  2. Right ventricular systolic function is normal. The right ventricular size is normal. Tricuspid regurgitation signal is inadequate for assessing PA pressure.  3.  The mitral valve is normal in structure. No evidence  of mitral valve regurgitation. No evidence of mitral stenosis.  4. The aortic valve is normal in structure. Aortic valve regurgitation is not visualized. Mild to moderate aortic valve sclerosis/calcification is present, without any evidence of aortic stenosis.  5. Technically challenging study due to limited acoustic windows and no apical window. FINDINGS  Left Ventricle: Left ventricular ejection fraction, by estimation, is 55 to 60%. The left ventricle has normal function. Left ventricular endocardial border not optimally defined to evaluate regional wall motion. The left ventricular internal cavity size was normal in size. There is no left ventricular hypertrophy. Left ventricular diastolic function could not be evaluated. Right Ventricle: The right ventricular size is normal. No increase in right ventricular wall thickness. Right ventricular systolic function is normal. Tricuspid regurgitation signal is inadequate for assessing PA pressure. The tricuspid regurgitant velocity is 2.55 m/s, and with an assumed right atrial pressure of 10 mmHg, the estimated right ventricular systolic pressure is 24.2 mmHg. Left Atrium: Left atrial size was normal in size. Right Atrium: Right atrial size was normal in size. Pericardium: There is no evidence of pericardial effusion. Mitral Valve: The mitral valve is normal in structure. Normal mobility of the mitral valve leaflets. No evidence of mitral valve regurgitation. No evidence of mitral valve stenosis. Tricuspid Valve: The tricuspid valve is normal in structure. Tricuspid valve regurgitation is trivial. No evidence of tricuspid stenosis. Aortic Valve: The aortic valve is normal in structure. Aortic valve regurgitation is not visualized. Mild to moderate aortic valve sclerosis/calcification is present, without any evidence of aortic stenosis. Pulmonic Valve: The pulmonic valve was normal in structure. Pulmonic valve  regurgitation is not visualized. No evidence of pulmonic stenosis. Aorta: The aortic root is normal in size and structure. Venous: The inferior vena cava was not well visualized. IAS/Shunts: No atrial level shunt detected by color flow Doppler.  LEFT VENTRICLE PLAX 2D LVIDd:         5.04 cm LVIDs:         2.70 cm LV PW:         0.93 cm LV IVS:        0.83 cm LVOT diam:     2.30 cm LVOT Area:     4.15 cm  LEFT ATRIUM         Index LA diam:    2.30 cm 1.06 cm/m                        PULMONIC VALVE AORTA                 PV Vmax:        0.99 m/s Ao Root diam: 3.20 cm PV Peak grad:   3.9 mmHg                       RVOT Peak grad: 7 mmHg  TRICUSPID VALVE TR Peak grad:   26.0 mmHg TR Vmax:        255.00 cm/s  SHUNTS Systemic Diam: 2.30 cm Kathlyn Sacramento MD Electronically signed by Kathlyn Sacramento MD Signature Date/Time: 10/02/2019/1:31:07 PM    Final    @PROBHOSP @ DG Chest Port 1 View  Result Date: 10/02/2019 CLINICAL DATA:  Acute hypoxemic respiratory failure. Additional provided: Hypertension, diastolic heart failure. EXAM: PORTABLE CHEST 1 VIEW COMPARISON:  Prior chest radiograph 09/29/2019 and earlier FINDINGS: Mild cardiomegaly, unchanged. Aortic atherosclerosis. Central pulmonary vascular congestion. There is a small to moderate-sized right pleural effusion with associated right  basilar atelectasis and/or consolidation. No definite left pleural effusion. Mild left basilar atelectasis. No evidence of pneumothorax. No acute bony abnormality IMPRESSION: Unchanged mild cardiomegaly.  Central pulmonary vascular congestion. Small to moderate-sized right pleural effusion with associated right basilar atelectasis and/or consolidation. Mild left basilar atelectasis. Aortic Atherosclerosis (ICD10-I70.0). Electronically Signed   By: Jackey Loge DO   On: 10/02/2019 13:09   ECHOCARDIOGRAM COMPLETE  Result Date: 10/02/2019    ECHOCARDIOGRAM REPORT   Patient Name:   JIMMY STIPES [facility] Date of Exam: 10/02/2019 Medical Rec  #:  [id number]         Height:       70.0 in Accession #:    [id number]        Weight:       216.7 lb Date of Birth:  13-Mar-1946         BSA:          2.160 m Patient Age:    74 years          BP:           131/61 mmHg Patient Gender: F                 HR:           96 bpm. Exam Location:  [facility] Procedure: 2D Echo, Cardiac Doppler and Color Doppler STAT ECHO Indications:     CHF-acute diastolic 428.31  History:         Patient has prior history of Echocardiogram examinations, most                  recent 02/04/2017. Risk Factors:Hypertension and Dyslipidemia.                  Diastolic heart failure.  Sonographer:     Cristela Blue RDCS (AE) Referring Phys:  [id number] Vida Rigger Diagnosing Phys: Lorine Bears MD  Sonographer Comments: Technically challenging study due to limited acoustic windows and no apical window. IMPRESSIONS  1. Left ventricular ejection fraction, by estimation, is 55 to 60%. The left ventricle has normal function. Left ventricular endocardial border not optimally defined to evaluate regional wall motion. Left ventricular diastolic function could not be evaluated.  2. Right ventricular systolic function is normal. The right ventricular size is normal. Tricuspid regurgitation signal is inadequate for assessing PA pressure.  3. The mitral valve is normal in structure. No evidence of mitral valve regurgitation. No evidence of mitral stenosis.  4. The aortic valve is normal in structure. Aortic valve regurgitation is not visualized. Mild to moderate aortic valve sclerosis/calcification is present, without any evidence of aortic stenosis.  5. Technically challenging study due to limited acoustic windows and no apical window. FINDINGS  Left Ventricle: Left ventricular ejection fraction, by estimation, is 55 to 60%. The left ventricle has normal function. Left ventricular endocardial border not optimally defined to evaluate regional wall motion. The left ventricular internal cavity size was normal in  size. There is no left ventricular hypertrophy. Left ventricular diastolic function could not be evaluated. Right Ventricle: The right ventricular size is normal. No increase in right ventricular wall thickness. Right ventricular systolic function is normal. Tricuspid regurgitation signal is inadequate for assessing PA pressure. The tricuspid regurgitant velocity is 2.55 m/s, and with an assumed right atrial pressure of 10 mmHg, the estimated right ventricular systolic pressure is 36.0 mmHg. Left Atrium: Left atrial size was normal in size. Right Atrium: Right atrial size was normal in size. Pericardium: There is no  evidence of pericardial effusion. Mitral Valve: The mitral valve is normal in structure. Normal mobility of the mitral valve leaflets. No evidence of mitral valve regurgitation. No evidence of mitral valve stenosis. Tricuspid Valve: The tricuspid valve is normal in structure. Tricuspid valve regurgitation is trivial. No evidence of tricuspid stenosis. Aortic Valve: The aortic valve is normal in structure. Aortic valve regurgitation is not visualized. Mild to moderate aortic valve sclerosis/calcification is present, without any evidence of aortic stenosis. Pulmonic Valve: The pulmonic valve was normal in structure. Pulmonic valve regurgitation is not visualized. No evidence of pulmonic stenosis. Aorta: The aortic root is normal in size and structure. Venous: The inferior vena cava was not well visualized. IAS/Shunts: No atrial level shunt detected by color flow Doppler.  LEFT VENTRICLE PLAX 2D LVIDd:         5.04 cm LVIDs:         2.70 cm LV PW:         0.93 cm LV IVS:        0.83 cm LVOT diam:     2.30 cm LVOT Area:     4.15 cm  LEFT ATRIUM         Index LA diam:    2.30 cm 1.06 cm/m                        PULMONIC VALVE AORTA                 PV Vmax:        0.99 m/s Ao Root diam: 3.20 cm PV Peak grad:   3.9 mmHg                       RVOT Peak grad: 7 mmHg  TRICUSPID VALVE TR Peak grad:   26.0 mmHg TR  Vmax:        255.00 cm/s  SHUNTS Systemic Diam: 2.30 cm Lorine Bears MD Electronically signed by Lorine Bears MD Signature Date/Time: 10/02/2019/1:31:07 PM    Final         ASSESSMENT AND PLAN    -Multidisciplinary rounds held today  Acute Hypoxic Respiratory Failure -due to pulmonary edema cannot rule out infectious infiltrate  - continue ancef -patient diuresed well today with slight improvement -CXR reviewed with b/l pulm edema   Acute on Chronic diastolic CHF -repeat TTE today  -diuresed well during dayshift today oxygen as needed ICU telemetry monitoring   Moderate protein calorie malnutrition -albumin low -low peripheral muscle bulk  -dietary consultation   Altered mental status with encephalopathy  - likely due to septic nephropathy   -treat underlying etiology  - discussed with family at bedside - no hx of substance abuse or dementia   Septic shock -present on admission  -due to UTI with ecoli   - contineu Ancef  Sacral decubitus ulceration    -wound care consult - appreciate input                   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 Patient is followed by the outpatient wound care center at [facility], last seen 08/15/19.  Wound type: Pressure Pressure Injury POA: Yes Measurement: 5cm x 3cm x 1.6cm with 2cm undermining at 12 o'clock.   ID -continue IV abx as prescibed -follow up cultures  GI/Nutrition GI PROPHYLAXIS as indicated DIET-->TF's as tolerated Constipation protocol as indicated  ENDO - ICU hypoglycemic\Hyperglycemia protocol -check FSBS per protocol  ELECTROLYTES -follow labs as needed -replace as needed -pharmacy consultation   DVT/GI PRX ordered -SCDs  TRANSFUSIONS AS NEEDED MONITOR FSBS ASSESS the need for LABS as needed   Critical care provider statement:    Critical care time (minutes):  34   Critical care time was exclusive of:  Separately billable procedures and treating other patients   Critical care was  necessary to treat or prevent imminent or life-threatening deterioration of the following conditions:  AMS with septic encephalopathy, UTI sepsis, distolic CHF, acute hypoxemia, multiple comorbid conditions   Critical care was time spent personally by me on the following activities:  Development of treatment plan with patient or surrogate, discussions with consultants, evaluation of patient's response to treatment, examination of patient, obtaining history from patient or surrogate, ordering and performing treatments and interventions, ordering and review of laboratory studies and re-evaluation of patient's condition.  I assumed direction of critical care for this patient from another provider in my specialty: no    This document was prepared using Dragon voice recognition software and may include unintentional dictation errors.    Vida RiggerFuad Jory Welke, M.D.  Division of Pulmonary & Critical Care Medicine  [facility]

## 2019-10-03 NOTE — Progress Notes (Signed)
SLP Cancellation Note  Patient Details Name: Jessica [name] MRN: [id number] DOB: 06-05-1945   Cancelled treatment:       Reason Eval/Treat Not Completed: Medical issues which prohibited therapy (Per nursing, pt currently NPO for pending ultrasound)   This writer spoke with pt's family. [name] voiced appreciation for the care pt received yesterday during respiratory decline.   This writer will check back as able for return to PO intake.   Hikari Tripp B. Dreama Saa M.S., CCC-SLP, [facility] Speech-Language Pathologist Rehabilitation Services Office [phone]    Sharifa Bucholz Dreama Saa 10/03/2019, 9:08 AM

## 2019-10-03 NOTE — Progress Notes (Signed)
Inpatient Rehabilitation Admissions Coordinator  Inpatient rehab consult received per request of Dr. Karna Christmas for autonomic instability for Physiatry consult. I discussed with Dr. Carlis Abbott and she will follow up.  Ottie Glazier, RN, MSN Rehab Admissions Coordinator [phone] 10/03/2019 3:38 PM

## 2019-10-04 DIAGNOSIS — I5033 Acute on chronic diastolic (congestive) heart failure: Secondary | ICD-10-CM

## 2019-10-04 DIAGNOSIS — N179 Acute kidney failure, unspecified: Secondary | ICD-10-CM

## 2019-10-04 DIAGNOSIS — J9601 Acute respiratory failure with hypoxia: Secondary | ICD-10-CM

## 2019-10-04 DIAGNOSIS — A4151 Sepsis due to Escherichia coli [E. coli]: Secondary | ICD-10-CM

## 2019-10-04 DIAGNOSIS — E876 Hypokalemia: Secondary | ICD-10-CM

## 2019-10-04 LAB — CBC WITH DIFFERENTIAL/PLATELET
Abs Immature Granulocytes: 0.17 10*3/uL — ABNORMAL HIGH (ref 0.00–0.07)
Basophils Absolute: 0.1 10*3/uL (ref 0.0–0.1)
Basophils Relative: 1 %
Eosinophils Absolute: 0.2 10*3/uL (ref 0.0–0.5)
Eosinophils Relative: 1 %
HCT: 32.3 % — ABNORMAL LOW (ref 36.0–46.0)
Hemoglobin: 11.2 g/dL — ABNORMAL LOW (ref 12.0–15.0)
Immature Granulocytes: 1 %
Lymphocytes Relative: 27 %
Lymphs Abs: 3.2 10*3/uL (ref 0.7–4.0)
MCH: 27.8 pg (ref 26.0–34.0)
MCHC: 34.7 g/dL (ref 30.0–36.0)
MCV: 80.1 fL (ref 80.0–100.0)
Monocytes Absolute: 1.1 10*3/uL — ABNORMAL HIGH (ref 0.1–1.0)
Monocytes Relative: 10 %
Neutro Abs: 7.1 10*3/uL (ref 1.7–7.7)
Neutrophils Relative %: 60 %
Platelets: 212 10*3/uL (ref 150–400)
RBC: 4.03 MIL/uL (ref 3.87–5.11)
RDW: 19 % — ABNORMAL HIGH (ref 11.5–15.5)
Smear Review: NORMAL
WBC: 11.8 10*3/uL — ABNORMAL HIGH (ref 4.0–10.5)
nRBC: 0 % (ref 0.0–0.2)

## 2019-10-04 LAB — BASIC METABOLIC PANEL
Anion gap: 11 (ref 5–15)
BUN: 5 mg/dL — ABNORMAL LOW (ref 8–23)
CO2: 32 mmol/L (ref 22–32)
Calcium: 8 mg/dL — ABNORMAL LOW (ref 8.9–10.3)
Chloride: 99 mmol/L (ref 98–111)
Creatinine, Ser: 0.71 mg/dL (ref 0.44–1.00)
GFR calc Af Amer: >60 mL/min (ref >60)
GFR calc non Af Amer: >60 mL/min (ref >60)
Glucose, Bld: 129 mg/dL — ABNORMAL HIGH (ref 70–99)
Potassium: 2.3 mmol/L — CL (ref 3.5–5.1)
Sodium: 142 mmol/L (ref 135–145)

## 2019-10-04 LAB — CULTURE, BLOOD (ROUTINE X 2)
Culture: NO GROWTH
Special Requests: ADEQUATE

## 2019-10-04 LAB — MAGNESIUM: Magnesium: 1.8 mg/dL (ref 1.7–2.4)

## 2019-10-04 MED ORDER — ALBUMIN HUMAN 25 % IV SOLN
12.5000 g | Freq: Every day | INTRAVENOUS | Status: DC
  Administered 2019-10-04 – 2019-10-05 (×2): 12.5 g via INTRAVENOUS
  Filled 2019-10-04 (×2): qty 50

## 2019-10-04 MED ORDER — POTASSIUM CHLORIDE 10 MEQ/100ML IV SOLN
10.0000 meq | INTRAVENOUS | Status: AC
  Administered 2019-10-04 (×2): 10 meq via INTRAVENOUS
  Filled 2019-10-04 (×2): qty 100

## 2019-10-04 MED ORDER — MAGNESIUM SULFATE 2 GM/50ML IV SOLN
2.0000 g | Freq: Once | INTRAVENOUS | Status: AC
  Administered 2019-10-04: 2 g via INTRAVENOUS
  Filled 2019-10-04: qty 50

## 2019-10-04 MED ORDER — METOPROLOL SUCCINATE ER 25 MG PO TB24
12.5000 mg | ORAL_TABLET | Freq: Every day | ORAL | Status: DC
  Administered 2019-10-04 – 2019-10-05 (×2): 12.5 mg via ORAL
  Filled 2019-10-04 (×2): qty 0.5

## 2019-10-04 MED ORDER — HYDROCORTISONE NA SUCCINATE PF 100 MG IJ SOLR
50.0000 mg | Freq: Four times a day (QID) | INTRAMUSCULAR | Status: DC
  Administered 2019-10-04 – 2019-10-05 (×3): 50 mg via INTRAVENOUS
  Filled 2019-10-04 (×3): qty 2

## 2019-10-04 MED ORDER — POTASSIUM CHLORIDE CRYS ER 20 MEQ PO TBCR
40.0000 meq | EXTENDED_RELEASE_TABLET | Freq: Three times a day (TID) | ORAL | Status: AC
  Administered 2019-10-04 (×3): 40 meq via ORAL
  Filled 2019-10-04 (×3): qty 2

## 2019-10-04 MED ORDER — MIDODRINE HCL 5 MG PO TABS
5.0000 mg | ORAL_TABLET | Freq: Three times a day (TID) | ORAL | Status: DC
  Administered 2019-10-04: 5 mg via ORAL
  Filled 2019-10-04 (×2): qty 1

## 2019-10-04 NOTE — Progress Notes (Signed)
PT Cancellation Note  Patient Details Name: Jessica [name] MRN: [id number] DOB: Jan 21, 1946   Cancelled Treatment:    Reason Eval/Treat Not Completed: PT screened, no needs identified, will sign off (Consult received and chart reviewed.  Per previous documentation, referenced/confirmed this admission, history significant for bed-bound baseline status, dependent care and use of manual (hoyer) lift for functional transfers as needed x16 years (due to spinal stenosis and associated nerve damage/paraplegia); husband and son providing care.  Despite acute medical issues, functional status unchanged (as patient will remain dependent), thus, no acute PT needs identified at this time.  Will complete order; please re-consult should needs change.  [name] H. Manson Passey, PT, DPT, NCS 10/04/19, 3:56 PM [phone]

## 2019-10-04 NOTE — Progress Notes (Signed)
  Speech Language Pathology Treatment: Dysphagia  Patient Details Name: Jessica [name] MRN: [id number] DOB: [birth year] Today's Date: 10/04/2019 Time: 9622-2979 SLP Time Calculation (min) (ACUTE ONLY): 28 min  Assessment / Plan / Recommendation Clinical Impression  Skilled treatment session focused on pt's ongoing dysphagia goals. SLP recieved pt upright in bed, her husband and son present. Son had many questions regarding how pt should take her medicine. At this time recommend pt take her medicine crushed, if medicine is not able to be crushed, can be dissolved into thin liquids. SLP provided skilled observation of pt consuming trials of thin liquids via straw. Pt with appearance of timely oral phase, swift swallow initiation and was free of any overt s/s of aspiration. Silent aspiration cannot be ruled out at bedside but pt's vitals remained stable throughout consumption. Pt's overall medical condition places her at increased risk of aspiration. Therefore, education was provided to pt's family and nurse on strict aspiration precautions. At this time, recommend dysphagia 2 diet with thin liquids via single straw sips, medicine crushed in puree or dissolved into thin liquids. All questions were answered to family's satisfaction, MD aware of current diet recommendation. ST to sign off at this time.     HPI HPI: This is a 74 yo female with a PMH of  Chronic pain, Diastolic CHF, Hyperlipidemia, Hypertension, Hypothyroidism, Morbid obesity, chronic indwelling Urinary catheter use, recurrent UTI, spinal stenosis, sacral wound and visual hallucination. She presented to [facility] ER from a home with chief complaints of altered mental status. At baseline, pt is bedbound (16 years) d/t spinal stenosis.  CXR showed mild bilateral atelectasis.  UA positive for UTI.      SLP Plan  All goals met       Recommendations  Diet recommendations: Dysphagia 2 (fine chop);Thin liquid Liquids provided via:  Straw Medication Administration: Crushed with puree Supervision: Staff to assist with self feeding;Full supervision/cueing for compensatory strategies;Trained caregiver to feed patient Compensations: Minimize environmental distractions;Slow rate;Small sips/bites Postural Changes and/or Swallow Maneuvers: Seated upright 90 degrees;Upright 30-60 min after meal                Oral Care Recommendations: Oral care QID Follow up Recommendations: None SLP Visit Diagnosis: Dysphagia, unspecified (R13.10) Plan: All goals met       GO               Barri Neidlinger B. Rutherford Nail M.S., CCC-SLP, [facility] Office [phone]  [name] 10/04/2019, 9:46 AM

## 2019-10-04 NOTE — Progress Notes (Signed)
PHARMACY CONSULT NOTE  Pharmacy Consult for Electrolyte Monitoring and Replacement   Recent Labs: Potassium (mmol/L)  Date Value  10/04/2019 2.3 (LL)   Magnesium (mg/dL)  Date Value  73/95/8441 1.8   Calcium (mg/dL)  Date Value  71/27/8718 8.0 (L)   Albumin (g/dL)  Date Value  36/72/5500 2.4 (L)   Phosphorus (mg/dL)  Date Value  16/42/9037 3.2   Sodium (mmol/L)  Date Value  10/04/2019 142     Assessment: 74 year old female with E.coli bacteremia. Pharmacy consulted for electrolyte replacement.  Goal of Therapy:  Electrolytes WNL  Plan:  Potassium down to 2.3. MD ordered potassium 40 mEq PO x 3 + 10 mEq IV x 2 as well as mag 2 g IV x 1. BMP ordered with morning labs.  Pricilla Riffle ,PharmD Clinical Pharmacist 10/04/2019 3:39 PM

## 2019-10-04 NOTE — Progress Notes (Signed)
°   10/04/19 0745  Clinical Encounter Type  Visited With Patient and family together  Visit Type Follow-up  Referral From Chaplain  Consult/Referral To Chaplain  Chaplain spoke with husband and son. Son was feeding patient scrambled eggs and potatoes and husband was sitting in the chair looking at television. They said patient was better. She looked better and was more alert. Son thanked chaplain for checking on them and chaplain told them that she would follow up with them later.

## 2019-10-04 NOTE — Progress Notes (Signed)
Patient ID: Jessica [name], female   DOB: 1946-01-26, 74 y.o.   MRN: [id number] [facility] PROGRESS NOTE  Jessica [name] RCV:[id number] DOB: May 25, 1945 DOA: 09/29/2019 PCP: Clovia Cuff, MD  HPI/Subjective: Patient was admitted with septic shock on 09/29/2019.  Patient currently feeling okay.  Upgraded to thin liquids.  Patient did not offer any complaints today.  Not having any pain.  Objective: Vitals:   10/04/19 1200 10/04/19 1300  BP: 110/69 107/66  Pulse: (!) 107 (!) 104  Resp: 20 18  Temp:    SpO2: 94% 94%    Intake/Output Summary (Last 24 hours) at 10/04/2019 1410 Last data filed at 10/04/2019 1300 Gross per 24 hour  Intake 200 ml  Output 1225 ml  Net -1025 ml   Filed Weights   10/02/19 0500 10/03/19 0500 10/04/19 0500  Weight: 98.3 kg 96.1 kg 97.6 kg    ROS: Review of Systems  Respiratory: Negative for shortness of breath.   Cardiovascular: Negative for chest pain.  Gastrointestinal: Positive for constipation. Negative for abdominal pain, nausea and vomiting.  Musculoskeletal: Negative for joint pain.   Exam: Physical Exam  HENT:  Nose: No mucosal edema.  Mouth/Throat: Oropharynx is clear.  Eyes: Pupils are equal, round, and reactive to light. Lids are normal.  Cardiovascular: S1 normal, S2 normal and normal heart sounds. Tachycardia present.  No murmur heard. Respiratory: She has decreased breath sounds in the right lower field and the left lower field. She has no wheezes. She has no rhonchi.  GI: Soft. There is no abdominal tenderness.  Musculoskeletal:     Right ankle: Swelling present.     Left ankle: Swelling present.  Neurological: She is alert.  Skin: Skin is warm.  Large stage (greater than a silver dollar) IV decubitus ulcer, surrounding erythema.  Psychiatric: Mood normal.      Data Reviewed: Basic Metabolic Panel: Recent Labs  Lab 09/29/19 0840 09/30/19 0443 10/01/19 0428 10/03/19 1123 10/04/19 0612  NA 131* 134* 134* 141  142  K 3.4* 3.1* 3.5 4.3 2.3*  CL 89* 95* 97* 101 99  CO2 28 28 26 31  32  GLUCOSE 108* 90 67* 85 129*  BUN 14 13 12  6* 5*  CREATININE 1.10* 0.83 0.78 0.65 0.71  CALCIUM 7.8* 7.8* 8.0* 8.1* 8.0*  MG  --  1.3* 2.6* 2.0 1.8  PHOS  --  3.4 2.8 3.2  --    Liver Function Tests: Recent Labs  Lab 09/29/19 0840 09/30/19 0443 10/01/19 0428  AST 54* 44* 39  ALT 28 21 19   ALKPHOS 174* 154* 132*  BILITOT 1.8* 2.0* 1.8*  PROT 5.2* 5.1* 4.8*  ALBUMIN 1.6* 2.0* 2.4*    CBC: Recent Labs  Lab 09/29/19 0840 09/30/19 0443 10/01/19 0428 10/04/19 0612  WBC 22.3* 22.0* 8.7 11.8*  NEUTROABS 17.9*  --   --  7.1  HGB 12.3 11.5* 10.1* 11.2*  HCT 36.2 33.4* 29.7* 32.3*  MCV 81.5 80.3 81.1 80.1  PLT 276 255 180 212    CBG: Recent Labs  Lab 09/29/19 1436 09/29/19 1438 10/01/19 0545 10/01/19 0620 10/01/19 0734  GLUCAP 49* 130* 56* 63* 95    Recent Results (from the past 240 hour(s))  Urine Culture     Status: None   Collection Time: 09/29/19  5:30 AM   UR  Result Value Ref Range Status   Urine Culture, Routine Final report  Final   Organism ID, Bacteria Comment  Final    Comment: Greater than 2 organisms  recovered, none predominant. Please submit another sample if clinically indicated. Greater than 100,000 colony forming units per mL   Culture, blood (Routine x 2)     Status: None   Collection Time: 09/29/19  8:41 AM   Specimen: BLOOD  Result Value Ref Range Status   Specimen Description BLOOD LEFT WRIST  Final   Special Requests   Final    BOTTLES DRAWN AEROBIC AND ANAEROBIC Blood Culture adequate volume   Culture   Final    NO GROWTH 5 DAYS Performed at [facility], [address] [place], [place] [address]    Report Status 10/04/2019 FINAL  Final  Culture, blood (Routine x 2)     Status: Abnormal   Collection Time: 09/29/19  8:41 AM   Specimen: BLOOD RIGHT HAND  Result Value Ref Range Status   Specimen Description   Final    BLOOD RIGHT HAND Performed  at [facility], [address], [place], [place] [address]    Special Requests   Final    BOTTLES DRAWN AEROBIC AND ANAEROBIC Blood Culture results may not be optimal due to an inadequate volume of blood received in culture bottles Performed at [facility], [address], [place], [place] [address]    Culture  Setup Time   Final    GRAM NEGATIVE RODS IN BOTH AEROBIC AND ANAEROBIC BOTTLES CRITICAL RESULT CALLED TO, READ BACK BY AND VERIFIED WITH: DAVID BESANTI 09/29/2019 1128 TTG Performed at [facility], [address], [place], [place] [address]    Culture ESCHERICHIA COLI (A)  Final   Report Status 10/03/2019 FINAL  Final   Organism ID, Bacteria ESCHERICHIA COLI  Final      Susceptibility   Escherichia coli - MIC*    AMPICILLIN >=32 RESISTANT Resistant     CEFAZOLIN <=4 SENSITIVE Sensitive     CEFEPIME <=0.12 SENSITIVE Sensitive     CEFTAZIDIME <=1 SENSITIVE Sensitive     CEFTRIAXONE <=0.25 SENSITIVE Sensitive     CIPROFLOXACIN <=0.25 SENSITIVE Sensitive     GENTAMICIN <=1 SENSITIVE Sensitive     IMIPENEM <=0.25 SENSITIVE Sensitive     TRIMETH/SULFA <=20 SENSITIVE Sensitive     AMPICILLIN/SULBACTAM 16 INTERMEDIATE Intermediate     PIP/TAZO <=4 SENSITIVE Sensitive     * ESCHERICHIA COLI  Blood Culture ID Panel (Reflexed)     Status: Abnormal   Collection Time: 09/29/19  8:41 AM  Result Value Ref Range Status   Enterococcus species NOT DETECTED NOT DETECTED Final   Listeria monocytogenes NOT DETECTED NOT DETECTED Final   Staphylococcus species NOT DETECTED NOT DETECTED Final   Staphylococcus aureus (BCID) NOT DETECTED NOT DETECTED Final   Streptococcus species NOT DETECTED NOT DETECTED Final   Streptococcus agalactiae NOT DETECTED NOT DETECTED Final   Streptococcus pneumoniae NOT DETECTED NOT DETECTED Final   Streptococcus pyogenes NOT DETECTED NOT DETECTED Final   Acinetobacter baumannii NOT DETECTED NOT DETECTED Final   Enterobacteriaceae species  DETECTED (A) NOT DETECTED Final    Comment: Enterobacteriaceae represent a large family of gram-negative bacteria, not a single organism. CRITICAL RESULT CALLED TO, READ BACK BY AND VERIFIED WITH: DAVID BASANTI 09/30/2019 2348 TTG    Enterobacter cloacae complex NOT DETECTED NOT DETECTED Final   Escherichia coli DETECTED (A) NOT DETECTED Final    Comment: CRITICAL RESULT CALLED TO, READ BACK BY AND VERIFIED WITH: DAVID BASANTI 09/29/2019 @2348     Klebsiella oxytoca NOT DETECTED NOT DETECTED Final   Klebsiella pneumoniae NOT  DETECTED NOT DETECTED Final   Proteus species NOT DETECTED NOT DETECTED Final   Serratia marcescens NOT DETECTED NOT DETECTED Final   Carbapenem resistance NOT DETECTED NOT DETECTED Final   Haemophilus influenzae NOT DETECTED NOT DETECTED Final   Neisseria meningitidis NOT DETECTED NOT DETECTED Final   Pseudomonas aeruginosa NOT DETECTED NOT DETECTED Final   Candida albicans NOT DETECTED NOT DETECTED Final   Candida glabrata NOT DETECTED NOT DETECTED Final   Candida krusei NOT DETECTED NOT DETECTED Final   Candida parapsilosis NOT DETECTED NOT DETECTED Final   Candida tropicalis NOT DETECTED NOT DETECTED Final    Comment: Performed at [facility], [address] [place], [place] [address]  SARS Coronavirus 2 by RT PCR (hospital order, performed in [facility] hospital lab) Nasopharyngeal Nasopharyngeal Swab     Status: None   Collection Time: 09/29/19 11:50 AM   Specimen: Nasopharyngeal Swab  Result Value Ref Range Status   SARS Coronavirus 2 NEGATIVE NEGATIVE Final    Comment: (NOTE) SARS-CoV-2 target nucleic acids are NOT DETECTED.  The SARS-CoV-2 RNA is generally detectable in upper and lower respiratory specimens during the acute phase of infection. The lowest concentration of SARS-CoV-2 viral copies this assay can detect is 250 copies / mL. A negative result does not preclude SARS-CoV-2 infection and should not be used as the sole basis for  treatment or other patient management decisions.  A negative result may occur with improper specimen collection / handling, submission of specimen other than nasopharyngeal swab, presence of viral mutation(s) within the areas targeted by this assay, and inadequate number of viral copies (<250 copies / mL). A negative result must be combined with clinical observations, patient history, and epidemiological information.  Fact Sheet for Patients:   BoilerBrush.com.cy  Fact Sheet for Healthcare Providers: https://pope.com/  This test is not yet approved or  cleared by the [place] FDA and has been authorized for detection and/or diagnosis of SARS-CoV-2 by FDA under an Emergency Use Authorization (EUA).  This EUA will remain in effect (meaning this test can be used) for the duration of the COVID-19 declaration under Section 564(b)(1) of the Act, 21 U.S.C. section 360bbb-3(b)(1), unless the authorization is terminated or revoked sooner.  Performed at [facility], [address] [place], [place] [address]   MRSA PCR Screening     Status: None   Collection Time: 09/29/19  2:51 PM   Specimen: Nasal Mucosa; Nasopharyngeal  Result Value Ref Range Status   MRSA by PCR NEGATIVE NEGATIVE Final    Comment:        The GeneXpert MRSA Assay (FDA approved for NASAL specimens only), is one component of a comprehensive MRSA colonization surveillance program. It is not intended to diagnose MRSA infection nor to guide or monitor treatment for MRSA infections. Performed at [facility], [address], [place], [place] [address]   Urine Culture     Status: Abnormal   Collection Time: 09/29/19  2:52 PM   Specimen: Urine, Catheterized  Result Value Ref Range Status   Specimen Description   Final    URINE, CATHETERIZED Performed at [facility], [address] [place], [place] [address]    Special Requests    Final    Immunocompromised Performed at [facility], [address] [place], [place] [address]    Culture (A)  Final    >=100,000 COLONIES/mL ENTEROCOCCUS FAECALIS 60,000 COLONIES/mL ESCHERICHIA COLI    Report Status 10/02/2019 FINAL  Final  Organism ID, Bacteria ENTEROCOCCUS FAECALIS (A)  Final   Organism ID, Bacteria ESCHERICHIA COLI (A)  Final      Susceptibility   Escherichia coli - MIC*    AMPICILLIN >=32 RESISTANT Resistant     CEFAZOLIN <=4 SENSITIVE Sensitive     CEFTRIAXONE <=0.25 SENSITIVE Sensitive     CIPROFLOXACIN <=0.25 SENSITIVE Sensitive     GENTAMICIN <=1 SENSITIVE Sensitive     IMIPENEM <=0.25 SENSITIVE Sensitive     NITROFURANTOIN <=16 SENSITIVE Sensitive     TRIMETH/SULFA <=20 SENSITIVE Sensitive     AMPICILLIN/SULBACTAM 16 INTERMEDIATE Intermediate     PIP/TAZO <=4 SENSITIVE Sensitive     * 60,000 COLONIES/mL ESCHERICHIA COLI   Enterococcus faecalis - MIC*    AMPICILLIN <=2 SENSITIVE Sensitive     NITROFURANTOIN <=16 SENSITIVE Sensitive     VANCOMYCIN 1 SENSITIVE Sensitive     * >=100,000 COLONIES/mL ENTEROCOCCUS FAECALIS     Studies: DG Chest Port 1 View  Result Date: 10/03/2019 CLINICAL DATA:  Acute respiratory failure, hypoxia. EXAM: PORTABLE CHEST 1 VIEW COMPARISON:  October 02, 2019. FINDINGS: Stable cardiomegaly. No pneumothorax is noted. Stable mild right pleural effusion is noted with associated right basilar atelectasis or infiltrate. Stable left basilar subsegmental atelectasis. Bony thorax is unremarkable. IMPRESSION: Stable mild right pleural effusion with associated right basilar atelectasis or infiltrate. Stable left basilar subsegmental atelectasis. Electronically Signed   By: Lupita Raider M.D.   On: 10/03/2019 10:27   US Abdomen Limited RUQ  Result Date: 10/03/2019 CLINICAL DATA:  Upper abdominal pain, elevated LFTs, cirrhosis EXAM: ULTRASOUND ABDOMEN LIMITED RIGHT UPPER QUADRANT COMPARISON:  03/12/2017 CT abdomen FINDINGS:  Gallbladder: Small amount of dependent echogenic material without shadowing in gallbladder, question sludge versus tiny nonshadowing calculi. No definite shadowing calculi, gallbladder wall thickening, pericholecystic fluid, or sonographic Murphy sign. Common bile duct: Diameter: 3 mm, normal Liver: Heterogeneous increased echogenicity of the liver with nodular hepatic contour consistent with cirrhosis. No focal hepatic mass is identified though assessment of intrahepatic detail is limited by the degree of sound attenuation. Portal vein is patent on color Doppler imaging with normal direction of blood flow towards the liver. Other: Small amount of perihepatic free fluid. IMPRESSION: Minimal dependent sludge versus tiny nonshadowing calculi within gallbladder. Cirrhotic appearing liver without definite mass though intrahepatic assessment is limited by sound attenuation; if better intra hepatic visualization is required recommend MR assessment. Minimal perihepatic ascites. Electronically Signed   By: Ulyses Southward M.D.   On: 10/03/2019 12:56    Scheduled Meds: . baclofen  5 mg Oral TID  . Chlorhexidine Gluconate Cloth  6 each Topical Daily  . fludrocortisone  0.1 mg Oral Daily  . furosemide  20 mg Intravenous Daily  . heparin injection (subcutaneous)  5,000 Units Subcutaneous Q12H  . levothyroxine  150 mcg Oral Q0600  . metoprolol succinate  12.5 mg Oral Daily  . pantoprazole (PROTONIX) IV  40 mg Intravenous QHS  . potassium chloride  40 mEq Oral TID  . pregabalin  100 mg Oral TID  . vitamin B-12  1,000 mcg Oral Daily  . zinc oxide   Topical BID   Continuous Infusions: . sodium chloride Stopped (10/02/19 1443)  .  ceFAZolin (ANCEF) IV 2 g (10/04/19 0551)    Assessment/Plan:  1. Septic shock with E. coli, with acute kidney injury.  The patient has a chronic Foley catheter.  I asked the nurse to take out her current Foley catheter and put in a new Foley catheter today.  Patient is on  Ancef. 2. Acute hypoxic respiratory failure.  The patient does not wear oxygen at home.  Try to check pulse ox on room air. 3. Acute on chronic diastolic congestive heart failure on IV Lasix. 4. Stage IV sacral decubitus ulcer.  Continue local wound care. 5. Acute kidney injury.  Creatinine 1.1 on presentation and down to 0.71. 6. Hypokalemia and hypomagnesemia replace potassium IV and orally and magnesium IV. 7. Hypothyroidism unspecified on levothyroxine 8. Episodes of tachycardia with heart rate going up to 140.  Try to start low-dose metoprolol.  Pressure Injury 09/29/19 Leg Left;Posterior;Lower Stage 2 -  Partial thickness loss of dermis presenting as a shallow open injury with a red, pink wound bed without slough. three small, round stage two pressure ulcers. (Active)  09/29/19 1445  Location: Leg  Location Orientation: Left;Posterior;Lower  Staging: Stage 2 -  Partial thickness loss of dermis presenting as a shallow open injury with a red, pink wound bed without slough.  Wound Description (Comments): three small, round stage two pressure ulcers.  Present on Admission: Yes     Pressure Injury 09/29/19 Coccyx Medial Stage 4 - Full thickness tissue loss with exposed bone, tendon or muscle. (Active)  09/29/19 1445  Location: Coccyx  Location Orientation: Medial  Staging: Stage 4 - Full thickness tissue loss with exposed bone, tendon or muscle.  Wound Description (Comments):   Present on Admission: Yes       Code Status:     Code Status Orders  (From admission, onward)         Start     Ordered   09/29/19 1254  Full code  Continuous        09/29/19 1309        Code Status History    Date Active Date Inactive Code Status Order ID Comments User Context   01/15/2017 2127 02/11/2017 1733 Full Code [id number]  Houston SirenSainani, Vivek J, MD ED   01/01/2017 2006 01/03/2017 1805 Full Code [id number]  Shaune Pollackhen, Qing, MD ED   12/19/2016 0135 12/20/2016 1557 Full Code [id number]  Oralia ManisWillis, David, MD  Inpatient   09/12/2016 0513 09/13/2016 1812 Full Code [id number]  Arnaldo Nataliamond, Michael S, MD Inpatient   08/14/2016 1930 08/15/2016 1942 Full Code [id number]  Alford HighlandWieting, Joley Utecht, MD ED   07/06/2016 0749 07/07/2016 2105 Full Code [id number]  Arnaldo Nataliamond, Michael S, MD Inpatient   Advance Care Planning Activity     Family Communication: Spoke with son at the bedside Disposition Plan: Status is: Inpatient  Dispo: The patient is from: Home              Anticipated d/c is to: Home              Anticipated d/c date is: Likely needs a couple more days here in the hospital              Patient currently receiving IV antibiotics for E. coli sepsis.  Changing Foley catheter today  Antibiotics:  Ancef  Time spent: 28 minutes  Damacio Weisgerber [employer]sWieting  [facility]

## 2019-10-04 NOTE — Progress Notes (Signed)
CRITICAL CARE PROGRESS NOTE    Name: Jessica [name] MRN: [id number] DOB: [birth year]     LOS: 5   SUBJECTIVE FINDINGS & SIGNIFICANT EVENTS   Patient description:  Jessica [name] an 74 y.o.femalewith a PMH ofChronic pain, Diastolic CHF, Hyperlipidemia, Hypertension, Hypothyroidism, Morbid obesity, chronic indwelling Urinary catheter use, recurrent UTI, spinal stenosis and visual hallucinationadmitted with severe sepsis with septic shock with lactic acidosis secondary to catheter associated urinary tract infection.  Has been bedbound baseline.  She also has a chronic Foley catheter due to large decubitus ulcer in the sacrum.   Lines / Drains: PIVx2  Cultures / Sepsis markers: Urine culture +for Ecoli  Antibiotics: Ancef   Protocols / Consultants: Hospitalist, pccm  Tests / Events: TTE, CXR, lasix challenge  10/03/19- patient diuresed >2L in past 24h.  She is improved with decreased O2 requirement. Will continue medical mgt today with plan to transfer to [facility] in am if patient continues to improve. 10/04/19- Patient reports clinical improvement, she is borderline hypotensive. She is on lower dose lasix. Discussed short term care plan with son today and husband.  Patient optimizing for transfer to floor. Hospitalist team is following paitnet.   PAST MEDICAL HISTORY   Past Medical History:  Diagnosis Date   Chronic pain    Depression    Diastolic heart failure (HCC)    HLD (hyperlipidemia)    HTN (hypertension)    Hypothyroidism    Morbid obesity (HCC)    Recurrent UTI    Spinal stenosis    Visual hallucinations      SURGICAL HISTORY   Past Surgical History:  Procedure Laterality Date   ABDOMINAL HYSTERECTOMY     BACK SURGERY     HEMORROIDECTOMY     IR RADIOLOGIST EVAL  & MGMT  09/01/2017     FAMILY HISTORY   Family History  Problem Relation Age of Onset   Heart disease Father    Deep vein thrombosis Father      SOCIAL HISTORY   Social History   Tobacco Use   Smoking status: Never Smoker   Smokeless tobacco: Former Systems developer    Types: Snuff  Vaping Use   Vaping Use: Never used  Substance Use Topics   Alcohol use: No   Drug use: No     MEDICATIONS   Current Medication:  Current Facility-Administered Medications:    0.9 %  sodium chloride infusion, 250 mL, Intravenous, Continuous, Dallie Piles, RPH, Stopped at 10/02/19 1443   acetaminophen (TYLENOL) tablet 650 mg, 650 mg, Oral, Q4H PRN, Tyler Pita, MD   albumin human 25 % solution 12.5 g, 12.5 g, Intravenous, Daily, Rasa Degrazia, MD   baclofen (LIORESAL) tablet 5 mg, 5 mg, Oral, TID, Tyler Pita, MD, 5 mg at 10/04/19 1018   ceFAZolin (ANCEF) IVPB 2g/100 mL premix, 2 g, Intravenous, Q8H, Jillianne Gamino, MD, Last Rate: 200 mL/hr at 10/04/19 1419, 2 g at 10/04/19 1419   Chlorhexidine Gluconate Cloth 2 % PADS 6 each, 6 each, Topical, Daily, Tyler Pita, MD, 6 each at 10/03/19 1000   docusate sodium (COLACE) capsule 100 mg, 100 mg, Oral, BID PRN, Tyler Pita, MD, 100 mg at 10/03/19 1137   fludrocortisone (FLORINEF) tablet 0.1 mg, 0.1 mg, Oral, Daily, Adir Schicker, MD, 0.1 mg at 10/04/19 1018   furosemide (LASIX) injection 20 mg, 20 mg, Intravenous, Daily, Lanney Gins, Khali Perella, MD, 20 mg at 10/04/19 1029   heparin injection 5,000 Units, 5,000 Units, Subcutaneous, Q12H, Patsey Berthold,  Katherine Basset, MD, 5,000 Units at 10/04/19 1029   hydrocortisone sodium succinate (SOLU-CORTEF) 100 MG injection 50 mg, 50 mg, Intravenous, Q6H, Aiza Vollrath, MD   levothyroxine (SYNTHROID) tablet 150 mcg, 150 mcg, Oral, Q0600, Salena Saner, MD, 150 mcg at 10/04/19 0555   metoprolol succinate (TOPROL-XL) 24 hr tablet 12.5 mg, 12.5 mg, Oral, Daily, Wieting, Richard, MD,  12.5 mg at 10/04/19 1414   midodrine (PROAMATINE) tablet 5 mg, 5 mg, Oral, TID WC, Cindi Ghazarian, MD   ondansetron (ZOFRAN) injection 4 mg, 4 mg, Intravenous, Q6H PRN, Salena Saner, MD   pantoprazole (PROTONIX) injection 40 mg, 40 mg, Intravenous, QHS, Salena Saner, MD, 40 mg at 10/03/19 2141   polyethylene glycol (MIRALAX / GLYCOLAX) packet 17 g, 17 g, Oral, Daily PRN, Salena Saner, MD   potassium chloride SA (KLOR-CON) CR tablet 40 mEq, 40 mEq, Oral, TID, Renae Gloss, Richard, MD, 40 mEq at 10/04/19 0810   pregabalin (LYRICA) capsule 100 mg, 100 mg, Oral, TID, Salena Saner, MD, 100 mg at 10/04/19 1017   vitamin B-12 (CYANOCOBALAMIN) tablet 1,000 mcg, 1,000 mcg, Oral, Daily, Salena Saner, MD, 1,000 mcg at 10/04/19 1017   zinc oxide (BALMEX) 11.3 % cream, , Topical, BID, Marrion Coy, MD, 1 application at 10/04/19 1043    ALLERGIES   Ambien [zolpidem tartrate] and Zinc    REVIEW OF SYSTEMS    10 point ROS done and is negative except as per HPI and subjective findings.   PHYSICAL EXAMINATION   Vital Signs: Temp:  [99 F (37.2 C)-99.1 F (37.3 C)] 99 F (37.2 C) (06/23 0800) Pulse Rate:  [85-140] 111 (06/23 1414) Resp:  [15-23] 18 (06/23 1300) BP: (82-122)/(51-69) 92/59 (06/23 1414) SpO2:  [93 %-96 %] 94 % (06/23 1300) Weight:  [97.6 kg] 97.6 kg (06/23 0500)  GENERAL:Chronically ill appearing HEAD: Normocephalic, atraumatic.  EYES: Pupils equal, round, reactive to light.  No scleral icterus.  MOUTH: Moist mucosal membrane. NECK: Supple. No thyromegaly. No nodules. No JVD.  PULMONARY: bilateral crackles CARDIOVASCULAR: S1 and S2. Regular rate and rhythm. No murmurs, rubs, or gallops.  GASTROINTESTINAL: Soft, nontender, non-distended. No masses. Positive bowel sounds. No hepatosplenomegaly.  MUSCULOSKELETAL: 3+ pitting lower extermity edema  NEUROLOGIC: Mild distress due to acute illness SKIN:intact,warm,dry   PERTINENT DATA      Infusions:  sodium chloride Stopped (10/02/19 1443)   albumin human      ceFAZolin (ANCEF) IV 2 g (10/04/19 1419)   Scheduled Medications:  baclofen  5 mg Oral TID   Chlorhexidine Gluconate Cloth  6 each Topical Daily   fludrocortisone  0.1 mg Oral Daily   furosemide  20 mg Intravenous Daily   heparin injection (subcutaneous)  5,000 Units Subcutaneous Q12H   hydrocortisone sod succinate (SOLU-CORTEF) inj  50 mg Intravenous Q6H   levothyroxine  150 mcg Oral Q0600   metoprolol succinate  12.5 mg Oral Daily   midodrine  5 mg Oral TID WC   pantoprazole (PROTONIX) IV  40 mg Intravenous QHS   potassium chloride  40 mEq Oral TID   pregabalin  100 mg Oral TID   vitamin B-12  1,000 mcg Oral Daily   zinc oxide   Topical BID   PRN Medications: acetaminophen, docusate sodium, ondansetron (ZOFRAN) IV, polyethylene glycol Hemodynamic parameters:   Intake/Output: 06/22 0701 - 06/23 0700 In: 740 [P.O.:240; I.V.:100; IV Piggyback:400] Out: 1650 [Urine:1650]  Ventilator  Settings:     LAB RESULTS:  Basic Metabolic Panel: Recent Labs  Lab  09/29/19 0840 09/29/19 0840 09/30/19 0443 09/30/19 0443 10/01/19 0428 10/01/19 0428 10/03/19 1123 10/04/19 0612  NA 131*  --  134*  --  134*  --  141 142  K 3.4*   < > 3.1*   < > 3.5   < > 4.3 2.3*  CL 89*  --  95*  --  97*  --  101 99  CO2 28  --  28  --  26  --  31 32  GLUCOSE 108*  --  90  --  67*  --  85 129*  BUN 14  --  13  --  12  --  6* 5*  CREATININE 1.10*  --  0.83  --  0.78  --  0.65 0.71  CALCIUM 7.8*  --  7.8*  --  8.0*  --  8.1* 8.0*  MG  --   --  1.3*  --  2.6*  --  2.0 1.8  PHOS  --   --  3.4  --  2.8  --  3.2  --    < > = values in this interval not displayed.   Liver Function Tests: Recent Labs  Lab 09/29/19 0840 09/30/19 0443 10/01/19 0428  AST 54* 44* 39  ALT 28 21 19   ALKPHOS 174* 154* 132*  BILITOT 1.8* 2.0* 1.8*  PROT 5.2* 5.1* 4.8*  ALBUMIN 1.6* 2.0* 2.4*   No results for input(s):  LIPASE, AMYLASE in the last 168 hours. No results for input(s): AMMONIA in the last 168 hours. CBC: Recent Labs  Lab 09/29/19 0840 09/30/19 0443 10/01/19 0428 10/04/19 0612  WBC 22.3* 22.0* 8.7 11.8*  NEUTROABS 17.9*  --   --  7.1  HGB 12.3 11.5* 10.1* 11.2*  HCT 36.2 33.4* 29.7* 32.3*  MCV 81.5 80.3 81.1 80.1  PLT 276 255 180 212   Cardiac Enzymes: No results for input(s): CKTOTAL, CKMB, CKMBINDEX, TROPONINI in the last 168 hours. BNP: Invalid input(s): POCBNP CBG: Recent Labs  Lab 09/29/19 1436 09/29/19 1438 10/01/19 0545 10/01/19 0620 10/01/19 0734  GLUCAP 49* 130* 56* 63* 95       IMAGING RESULTS:  Imaging: DG Chest Port 1 View  Result Date: 10/03/2019 CLINICAL DATA:  Acute respiratory failure, hypoxia. EXAM: PORTABLE CHEST 1 VIEW COMPARISON:  October 02, 2019. FINDINGS: Stable cardiomegaly. No pneumothorax is noted. Stable mild right pleural effusion is noted with associated right basilar atelectasis or infiltrate. Stable left basilar subsegmental atelectasis. Bony thorax is unremarkable. IMPRESSION: Stable mild right pleural effusion with associated right basilar atelectasis or infiltrate. Stable left basilar subsegmental atelectasis. Electronically Signed   By: October 04, 2019 M.D.   On: 10/03/2019 10:27   10/05/2019 Abdomen Limited RUQ  Result Date: 10/03/2019 CLINICAL DATA:  Upper abdominal pain, elevated LFTs, cirrhosis EXAM: ULTRASOUND ABDOMEN LIMITED RIGHT UPPER QUADRANT COMPARISON:  03/12/2017 CT abdomen FINDINGS: Gallbladder: Small amount of dependent echogenic material without shadowing in gallbladder, question sludge versus tiny nonshadowing calculi. No definite shadowing calculi, gallbladder wall thickening, pericholecystic fluid, or sonographic Murphy sign. Common bile duct: Diameter: 3 mm, normal Liver: Heterogeneous increased echogenicity of the liver with nodular hepatic contour consistent with cirrhosis. No focal hepatic mass is identified though assessment of  intrahepatic detail is limited by the degree of sound attenuation. Portal vein is patent on color Doppler imaging with normal direction of blood flow towards the liver. Other: Small amount of perihepatic free fluid. IMPRESSION: Minimal dependent sludge versus tiny nonshadowing calculi within gallbladder. Cirrhotic appearing liver  without definite mass though intrahepatic assessment is limited by sound attenuation; if better intra hepatic visualization is required recommend MR assessment. Minimal perihepatic ascites. Electronically Signed   By: Ulyses Southward M.D.   On: 10/03/2019 12:56   @PROBHOSP @ No results found.      ASSESSMENT AND PLAN    -Multidisciplinary rounds held today  Acute Hypoxic Respiratory Failure -due to pulmonary edema cannot rule out infectious infiltrate  - continue ancef -patient diuresed well today with slight improvement -CXR reviewed with b/l pulm edema   Acute on Chronic diastolic CHF -repeat TTE today  -diuresed well during dayshift today oxygen as needed ICU telemetry monitoring   Moderate protein calorie malnutrition -albumin low -low peripheral muscle bulk  -dietary consultation   Altered mental status with encephalopathy  - likely due to septic nephropathy   -treat underlying etiology  - discussed with family at bedside - no hx of substance abuse or dementia   Septic shock -present on admission  -due to UTI with ecoli   - contineu Ancef  Sacral decubitus ulceration    -wound care consult - appreciate input                   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 Patient is followed by the outpatient wound care center at [facility], last seen 08/15/19.  Wound type: Pressure Pressure Injury POA: Yes Measurement: 5cm x 3cm x 1.6cm with 2cm undermining at 12 o'clock.   ID -continue IV abx as prescibed -follow up cultures  GI/Nutrition GI PROPHYLAXIS as indicated DIET-->TF's as tolerated Constipation protocol as indicated  ENDO - ICU  hypoglycemic\Hyperglycemia protocol -check FSBS per protocol   ELECTROLYTES -follow labs as needed -replace as needed -pharmacy consultation   DVT/GI PRX ordered -SCDs  TRANSFUSIONS AS NEEDED MONITOR FSBS ASSESS the need for LABS as needed   Critical care provider statement:    Critical care time (minutes):  34   Critical care time was exclusive of:  Separately billable procedures and treating other patients   Critical care was necessary to treat or prevent imminent or life-threatening deterioration of the following conditions:  AMS with septic encephalopathy, UTI sepsis, distolic CHF, acute hypoxemia, multiple comorbid conditions   Critical care was time spent personally by me on the following activities:  Development of treatment plan with patient or surrogate, discussions with consultants, evaluation of patient's response to treatment, examination of patient, obtaining history from patient or surrogate, ordering and performing treatments and interventions, ordering and review of laboratory studies and re-evaluation of patient's condition.  I assumed direction of critical care for this patient from another provider in my specialty: no    This document was prepared using Dragon voice recognition software and may include unintentional dictation errors.    10/15/19, M.D.  Division of Pulmonary & Critical Care Medicine  [facility]

## 2019-10-05 DIAGNOSIS — R6521 Severe sepsis with septic shock: Secondary | ICD-10-CM

## 2019-10-05 DIAGNOSIS — A419 Sepsis, unspecified organism: Secondary | ICD-10-CM

## 2019-10-05 DIAGNOSIS — L89154 Pressure ulcer of sacral region, stage 4: Secondary | ICD-10-CM

## 2019-10-05 LAB — CBC
HCT: 30.6 % — ABNORMAL LOW (ref 36.0–46.0)
Hemoglobin: 10.1 g/dL — ABNORMAL LOW (ref 12.0–15.0)
MCH: 27.5 pg (ref 26.0–34.0)
MCHC: 33 g/dL (ref 30.0–36.0)
MCV: 83.4 fL (ref 80.0–100.0)
Platelets: 218 10*3/uL (ref 150–400)
RBC: 3.67 MIL/uL — ABNORMAL LOW (ref 3.87–5.11)
RDW: 18.5 % — ABNORMAL HIGH (ref 11.5–15.5)
WBC: 6.8 10*3/uL (ref 4.0–10.5)
nRBC: 0 % (ref 0.0–0.2)

## 2019-10-05 LAB — BASIC METABOLIC PANEL
Anion gap: 11 (ref 5–15)
BUN: 8 mg/dL (ref 8–23)
CO2: 32 mmol/L (ref 22–32)
Calcium: 8 mg/dL — ABNORMAL LOW (ref 8.9–10.3)
Chloride: 100 mmol/L (ref 98–111)
Creatinine, Ser: 0.66 mg/dL (ref 0.44–1.00)
GFR calc Af Amer: >60 mL/min (ref >60)
GFR calc non Af Amer: >60 mL/min (ref >60)
Glucose, Bld: 162 mg/dL — ABNORMAL HIGH (ref 70–99)
Potassium: 3.6 mmol/L (ref 3.5–5.1)
Sodium: 143 mmol/L (ref 135–145)

## 2019-10-05 MED ORDER — FUROSEMIDE 20 MG PO TABS: ORAL_TABLET | ORAL | 0 refills | Status: AC

## 2019-10-05 MED ORDER — JUVEN PO PACK
1.0000 | PACK | Freq: Two times a day (BID) | ORAL | Status: DC
  Administered 2019-10-05 (×2): 1 via ORAL

## 2019-10-05 MED ORDER — BACLOFEN 5 MG PO TABS: 5.0000 mg | ORAL_TABLET | Freq: Three times a day (TID) | ORAL | 0 refills | Status: AC

## 2019-10-05 MED ORDER — ZINC OXIDE 11.3 % EX CREA: TOPICAL_CREAM | CUTANEOUS | 0 refills | Status: DC

## 2019-10-05 MED ORDER — CEPHALEXIN 500 MG PO CAPS
500.0000 mg | ORAL_CAPSULE | Freq: Three times a day (TID) | ORAL | Status: DC
  Administered 2019-10-05: 500 mg via ORAL
  Filled 2019-10-05 (×4): qty 1

## 2019-10-05 MED ORDER — MIDODRINE HCL 5 MG PO TABS
10.0000 mg | ORAL_TABLET | Freq: Three times a day (TID) | ORAL | Status: DC
  Administered 2019-10-05: 10 mg via ORAL
  Filled 2019-10-05: qty 2

## 2019-10-05 MED ORDER — MIDODRINE HCL 10 MG PO TABS: 10.0000 mg | ORAL_TABLET | Freq: Three times a day (TID) | ORAL | 0 refills | Status: AC

## 2019-10-05 MED ORDER — METOPROLOL SUCCINATE ER 25 MG PO TB24: 12.5000 mg | ORAL_TABLET | Freq: Every day | ORAL | 0 refills | Status: DC

## 2019-10-05 MED ORDER — JUVEN PO PACK: 1.0000 | PACK | Freq: Two times a day (BID) | ORAL | 0 refills | Status: DC

## 2019-10-05 MED ORDER — BACLOFEN 5 MG PO TABS: 1.0000 | ORAL_TABLET | Freq: Three times a day (TID) | ORAL | 3 refills | Status: DC

## 2019-10-05 MED ORDER — CEPHALEXIN 500 MG PO CAPS: 500.0000 mg | ORAL_CAPSULE | Freq: Three times a day (TID) | ORAL | 0 refills | Status: AC

## 2019-10-05 NOTE — Discharge Summary (Signed)
[facility] [place] at [facility]   [name]    MR#:  [id number]  DATE OF BIRTH:  March 14, 1946  DATE OF ADMISSION:  09/29/2019 ADMITTING PHYSICIAN: No admitting provider for patient encounter.  DATE OF DISCHARGE: 10/05/2019  4:12 PM  PRIMARY CARE PHYSICIAN: Annita Brod, MD    ADMISSION DIAGNOSIS:  Sepsis (HCC) [A41.9] Severe sepsis (HCC) [A41.9, R65.20] Urinary tract infection without hematuria, site unspecified [N39.0] Altered mental status, unspecified altered mental status type [R41.82] Sepsis without acute organ dysfunction, due to unspecified organism (HCC) [A41.9]  DISCHARGE DIAGNOSIS:  Active Problems:   Hypokalemia   Decubitus ulcer of sacral region, stage 4 (HCC)   Severe sepsis with septic shock (HCC)   Pressure injury of skin   Sepsis due to Escherichia coli with acute renal failure and septic shock (HCC)   UTI (urinary tract infection) due to urinary indwelling catheter (HCC)   Acute on chronic diastolic CHF (congestive heart failure) (HCC)   Acute hypoxemic respiratory failure (HCC)   AKI (acute kidney injury) (HCC)   Hypomagnesemia   SECONDARY DIAGNOSIS:   Past Medical History:  Diagnosis Date   Chronic pain    Depression    Diastolic heart failure (HCC)    HLD (hyperlipidemia)    HTN (hypertension)    Hypothyroidism    Morbid obesity (HCC)    Recurrent UTI    Spinal stenosis    Visual hallucinations     HOSPITAL COURSE:   1.  Septic shock with E. coli and acute kidney injury present on admission.  Yesterday when I saw her for the first time I asked the nurse to take out her chronic Foley catheter and put in a new catheter.  The urine was the etiology of her infection.  The patient was already on Ancef.  I did switch over to Keflex for another 7 days.  The patient was started on midodrine and is on 10 mg 3 times daily.  If blood pressure starts creeping up at home or in follow-up visit then this  medication can be discontinued.  Florinef was discontinued to use this should cause swelling. 2.  Acute hypoxic respiratory failure.  The patient does not wear oxygen at home.  Pulse ox on room air in the 90s.  As per nursing staff did desaturate overnight 89%.  May have an underlying sleep apnea. 3.  Acute diastolic congestive heart failure with lower extremity edema.  The patient was on IV Lasix since coming to the ICU.  In speaking with Dr. Karna Christmas, the patient's swelling is better.  We will give Lasix 40 mg orally daily for 7 days upon going home and then can go back to her usual 20 mg oral daily.  We will give TED hose for lower extremity edema. 4.  Stage IV sacral decubitus ulcer, full tissue thickness with exposed muscle, present on admission.  Continue local wound care.  Wet-to-dry dressing.  Home health. 5.  Acute kidney injury.  Creatinine was 1.1 on presentation and came down to 0.66 upon discharge home. 6.  Hypokalemia and hypomagnesemia.  Electrolytes were replaced aggressively yesterday and are better today. 7.  Hypothyroidism unspecified on levothyroxine 8.  Episodes of tachycardia with heart rate going up to 140.  I did start low-dose Toprol XL. 9.  Stage II decubiti left posterior leg partial-thickness, present on admission  Home health to change Foley catheter every 4 weeks.  Help out with dressing changes and home care.   Long discussion  with family on plan on what to do next.  The patient wanted to go home today.  The patient's son who takes care of her was nervous about her going to the regular floor from the ICU stepdown.  I explained that we can go home from the ICU versus going out to the floor for another day and reassess things tomorrow.  Family decided to go home from the ICU today.  Case discussed with Dr. Karna Christmas and he agreed with the plan.  DISCHARGE CONDITIONS:   Fair  CONSULTS OBTAINED:    DRUG ALLERGIES:   Allergies  Allergen Reactions   Ambien  [Zolpidem Tartrate] Other (See Comments)    Reaction: altered mental status   Zinc Other (See Comments)    Zinc ointments cause skin irritation    DISCHARGE MEDICATIONS:   Allergies as of 10/05/2019      Reactions   Ambien [zolpidem Tartrate] Other (See Comments)   Reaction: altered mental status   Zinc Other (See Comments)   Zinc ointments cause skin irritation      Medication List    STOP taking these medications   amlodipine-atorvastatin 2.5-10 MG tablet Commonly known as: CADUET     TAKE these medications   acetaminophen 325 MG tablet Commonly known as: TYLENOL Take 650 mg by mouth every 6 (six) hours as needed for mild pain.   albuterol 108 (90 Base) MCG/ACT inhaler Commonly known as: VENTOLIN HFA Inhale 2 puffs into the lungs every 4 (four) hours as needed for wheezing or shortness of breath.   albuterol (2.5 MG/3ML) 0.083% nebulizer solution Commonly known as: PROVENTIL Take 2.5 mg by nebulization every 6 (six) hours as needed for wheezing or shortness of breath.   ARIPiprazole 2 MG tablet Commonly known as: ABILIFY Take 2 mg by mouth daily.   aspirin EC 81 MG tablet Take 81 mg by mouth daily.   Baclofen 5 MG Tabs Take 5 mg by mouth 3 (three) times daily. What changed:   medication strength  how much to take  when to take this  additional instructions   Baclofen 5 MG Tabs Take 1 tablet by mouth 3 (three) times daily. What changed: You were already taking a medication with the same name, and this prescription was added. Make sure you understand how and when to take each.   cephALEXin 500 MG capsule Commonly known as: KEFLEX Take 1 capsule (500 mg total) by mouth every 8 (eight) hours for 7 days.   collagenase ointment Commonly known as: SANTYL Apply topically daily.   docusate sodium 100 MG capsule Commonly known as: COLACE Take 100 mg by mouth 2 (two) times daily.   furosemide 20 MG tablet Commonly known as: LASIX Two tablets daily for  one week then can take one tablet daily after that What changed:   how much to take  how to take this  when to take this  additional instructions   hydrocortisone 2.5 % cream Apply topically 2 (two) times daily.   levothyroxine 150 MCG tablet Commonly known as: SYNTHROID Take 150 mcg by mouth daily before breakfast.   metoprolol succinate 25 MG 24 hr tablet Commonly known as: TOPROL-XL Take 0.5 tablets (12.5 mg total) by mouth daily.   midodrine 10 MG tablet Commonly known as: PROAMATINE Take 1 tablet (10 mg total) by mouth 3 (three) times daily with meals.   nutrition supplement (JUVEN) Pack Take 1 packet by mouth 2 (two) times daily between meals.   nystatin cream Commonly known  as: MYCOSTATIN Apply 1 application topically 2 (two) times daily.   ondansetron 4 MG disintegrating tablet Commonly known as: ZOFRAN-ODT Take 4 mg by mouth every 8 (eight) hours as needed for nausea or vomiting.   polyethylene glycol 17 g packet Commonly known as: MIRALAX / GLYCOLAX Take 17 g by mouth daily.   potassium chloride 10 MEQ tablet Commonly known as: KLOR-CON Take 10 mEq by mouth daily.   pregabalin 100 MG capsule Commonly known as: LYRICA Take 100 mg by mouth 3 (three) times daily.   sennosides-docusate sodium 8.6-50 MG tablet Commonly known as: SENOKOT-S Take 1 tablet by mouth daily.   traZODone 50 MG tablet Commonly known as: DESYREL Take 50 mg by mouth at bedtime.   Venlafaxine HCl 225 MG Tb24 Take 225 mg by mouth daily.   vitamin B-12 1000 MCG tablet Commonly known as: CYANOCOBALAMIN Take 1,000 mcg by mouth daily.   Vitamin D (Ergocalciferol) 1.25 MG (50000 UNIT) Caps capsule Commonly known as: DRISDOL Take 50,000 Units by mouth every 30 (thirty) days.   zinc oxide 11.3 % Crea cream Commonly known as: BALMEX Apply to skin outside of wound        DISCHARGE INSTRUCTIONS:   Follow-up with PMD 5 days  If you experience worsening of your admission  symptoms, develop shortness of breath, life threatening emergency, suicidal or homicidal thoughts you must seek medical attention immediately by calling 911 or calling your MD immediately  if symptoms less severe.  You Must read complete instructions/literature along with all the possible adverse reactions/side effects for all the Medicines you take and that have been prescribed to you. Take any new Medicines after you have completely understood and accept all the possible adverse reactions/side effects.   Please note  You were cared for by a hospitalist during your hospital stay. If you have any questions about your discharge medications or the care you received while you were in the hospital after you are discharged, you can call the unit and asked to speak with the hospitalist on call if the hospitalist that took care of you is not available. Once you are discharged, your primary care physician will handle any further medical issues. Please note that NO REFILLS for any discharge medications will be authorized once you are discharged, as it is imperative that you return to your primary care physician (or establish a relationship with a primary care physician if you do not have one) for your aftercare needs so that they can reassess your need for medications and monitor your lab values.    Today   CHIEF COMPLAINT:   Chief Complaint  Patient presents with   Altered Mental Status    HISTORY OF PRESENT ILLNESS:  Jettie Paganhyllis Schoof  is a 74 y.o. female came in with altered mental status.   VITAL SIGNS:  Blood pressure 135/61, pulse 87, temperature 98.1 F (36.7 C), temperature source Oral, resp. rate 17, height 5\' 10"  (1.778 m), weight 97.3 kg, SpO2 92 %.  I/O:    Intake/Output Summary (Last 24 hours) at 10/05/2019 1705 Last data filed at 10/05/2019 0940 Gross per 24 hour  Intake 620.51 ml  Output 1725 ml  Net -1104.49 ml    PHYSICAL EXAMINATION:  GENERAL:  74 y.o.-year-old patient  lying in the bed with no acute distress.  EYES: Pupils equal, round, reactive to light and accommodation. No scleral icterus. HEENT: Head atraumatic, normocephalic. Oropharynx and nasopharynx clear.   LUNGS: Normal breath sounds bilaterally, no wheezing, rales,rhonchi or crepitation.  No use of accessory muscles of respiration.  CARDIOVASCULAR: S1, S2 normal. No murmurs, rubs, or gallops.  ABDOMEN: Soft, non-tender, non-distended.  EXTREMITIES: 2+ edema.  NEUROLOGIC: Cranial nerves II through XII are intact. Muscle strength 5/5 in all extremities. Sensation intact. Gait not checked.  PSYCHIATRIC: The patient is alert and oriented x 3.    DATA REVIEW:   CBC Recent Labs  Lab 10/05/19 0405  WBC 6.8  HGB 10.1*  HCT 30.6*  PLT 218    Chemistries  Recent Labs  Lab 10/01/19 0428 10/03/19 1123 10/04/19 0612 10/04/19 0612 10/05/19 0405  NA 134*   < > 142   < > 143  K 3.5   < > 2.3*   < > 3.6  CL 97*   < > 99   < > 100  CO2 26   < > 32   < > 32  GLUCOSE 67*   < > 129*   < > 162*  BUN 12   < > 5*   < > 8  CREATININE 0.78   < > 0.71   < > 0.66  CALCIUM 8.0*   < > 8.0*   < > 8.0*  MG 2.6*   < > 1.8  --   --   AST 39  --   --   --   --   ALT 19  --   --   --   --   ALKPHOS 132*  --   --   --   --   BILITOT 1.8*  --   --   --   --    < > = values in this interval not displayed.    Microbiology Results  Results for orders placed or performed during the hospital encounter of 09/29/19  Culture, blood (Routine x 2)     Status: None   Collection Time: 09/29/19  8:41 AM   Specimen: BLOOD  Result Value Ref Range Status   Specimen Description BLOOD LEFT WRIST  Final   Special Requests   Final    BOTTLES DRAWN AEROBIC AND ANAEROBIC Blood Culture adequate volume   Culture   Final    NO GROWTH 5 DAYS Performed at [facility], [address] [place], [place] [address]    Report Status 10/04/2019 FINAL  Final  Culture, blood (Routine x 2)     Status: Abnormal   Collection  Time: 09/29/19  8:41 AM   Specimen: BLOOD RIGHT HAND  Result Value Ref Range Status   Specimen Description   Final    BLOOD RIGHT HAND Performed at [facility], [address], [place], [place] [address]    Special Requests   Final    BOTTLES DRAWN AEROBIC AND ANAEROBIC Blood Culture results may not be optimal due to an inadequate volume of blood received in culture bottles Performed at [facility], [address] [place], [place] [address]    Culture  Setup Time   Final    GRAM NEGATIVE RODS IN BOTH AEROBIC AND ANAEROBIC BOTTLES CRITICAL RESULT CALLED TO, READ BACK BY AND VERIFIED WITH: DAVID BESANTI 09/29/2019 1128 TTG Performed at [facility], [address], [place], [place] [address]    Culture ESCHERICHIA COLI (A)  Final   Report Status 10/03/2019 FINAL  Final   Organism ID, Bacteria ESCHERICHIA COLI  Final      Susceptibility   Escherichia coli - MIC*    AMPICILLIN >=32 RESISTANT Resistant  CEFAZOLIN <=4 SENSITIVE Sensitive     CEFEPIME <=0.12 SENSITIVE Sensitive     CEFTAZIDIME <=1 SENSITIVE Sensitive     CEFTRIAXONE <=0.25 SENSITIVE Sensitive     CIPROFLOXACIN <=0.25 SENSITIVE Sensitive     GENTAMICIN <=1 SENSITIVE Sensitive     IMIPENEM <=0.25 SENSITIVE Sensitive     TRIMETH/SULFA <=20 SENSITIVE Sensitive     AMPICILLIN/SULBACTAM 16 INTERMEDIATE Intermediate     PIP/TAZO <=4 SENSITIVE Sensitive     * ESCHERICHIA COLI  Blood Culture ID Panel (Reflexed)     Status: Abnormal   Collection Time: 09/29/19  8:41 AM  Result Value Ref Range Status   Enterococcus species NOT DETECTED NOT DETECTED Final   Listeria monocytogenes NOT DETECTED NOT DETECTED Final   Staphylococcus species NOT DETECTED NOT DETECTED Final   Staphylococcus aureus (BCID) NOT DETECTED NOT DETECTED Final   Streptococcus species NOT DETECTED NOT DETECTED Final   Streptococcus agalactiae NOT DETECTED NOT DETECTED Final   Streptococcus pneumoniae NOT DETECTED NOT DETECTED Final    Streptococcus pyogenes NOT DETECTED NOT DETECTED Final   Acinetobacter baumannii NOT DETECTED NOT DETECTED Final   Enterobacteriaceae species DETECTED (A) NOT DETECTED Final    Comment: Enterobacteriaceae represent a large family of gram-negative bacteria, not a single organism. CRITICAL RESULT CALLED TO, READ BACK BY AND VERIFIED WITH: DAVID BASANTI 09/30/2019 2348 TTG    Enterobacter cloacae complex NOT DETECTED NOT DETECTED Final   Escherichia coli DETECTED (A) NOT DETECTED Final    Comment: CRITICAL RESULT CALLED TO, READ BACK BY AND VERIFIED WITH: DAVID BASANTI 09/29/2019     Klebsiella oxytoca NOT DETECTED NOT DETECTED Final   Klebsiella pneumoniae NOT DETECTED NOT DETECTED Final   Proteus species NOT DETECTED NOT DETECTED Final   Serratia marcescens NOT DETECTED NOT DETECTED Final   Carbapenem resistance NOT DETECTED NOT DETECTED Final   Haemophilus influenzae NOT DETECTED NOT DETECTED Final   Neisseria meningitidis NOT DETECTED NOT DETECTED Final   Pseudomonas aeruginosa NOT DETECTED NOT DETECTED Final   Candida albicans NOT DETECTED NOT DETECTED Final   Candida glabrata NOT DETECTED NOT DETECTED Final   Candida krusei NOT DETECTED NOT DETECTED Final   Candida parapsilosis NOT DETECTED NOT DETECTED Final   Candida tropicalis NOT DETECTED NOT DETECTED Final    Comment: Performed at [facility], [address] [place], [place] [address]  SARS Coronavirus 2 by RT PCR (hospital order, performed in [facility] hospital lab) Nasopharyngeal Nasopharyngeal Swab     Status: None   Collection Time: 09/29/19 11:50 AM   Specimen: Nasopharyngeal Swab  Result Value Ref Range Status   SARS Coronavirus 2 NEGATIVE NEGATIVE Final    Comment: (NOTE) SARS-CoV-2 target nucleic acids are NOT DETECTED.  The SARS-CoV-2 RNA is generally detectable in upper and lower respiratory specimens during the acute phase of infection. The lowest concentration of SARS-CoV-2 viral copies this  assay can detect is 250 copies / mL. A negative result does not preclude SARS-CoV-2 infection and should not be used as the sole basis for treatment or other patient management decisions.  A negative result may occur with improper specimen collection / handling, submission of specimen other than nasopharyngeal swab, presence of viral mutation(s) within the areas targeted by this assay, and inadequate number of viral copies (<250 copies / mL). A negative result must be combined with clinical observations, patient history, and epidemiological information.  Fact Sheet for Patients:   BoilerBrush.com.cy  Fact Sheet for Healthcare Providers: https://pope.com/  This test is not  yet approved or  cleared by the [place] and has been authorized for detection and/or diagnosis of SARS-CoV-2 by FDA under an Emergency Use Authorization (EUA).  This EUA will remain in effect (meaning this test can be used) for the duration of the COVID-19 declaration under Section 564(b)(1) of the Act, 21 U.S.C. section 360bbb-3(b)(1), unless the authorization is terminated or revoked sooner.  Performed at [facility], [address] [place], [place] [address]   MRSA PCR Screening     Status: None   Collection Time: 09/29/19  2:51 PM   Specimen: Nasal Mucosa; Nasopharyngeal  Result Value Ref Range Status   MRSA by PCR NEGATIVE NEGATIVE Final    Comment:        The GeneXpert MRSA Assay (FDA approved for NASAL specimens only), is one component of a comprehensive MRSA colonization surveillance program. It is not intended to diagnose MRSA infection nor to guide or monitor treatment for MRSA infections. Performed at [facility], [address] [place], [place] [address]   Urine Culture     Status: Abnormal   Collection Time: 09/29/19  2:52 PM   Specimen: Urine, Catheterized  Result Value Ref Range Status   Specimen  Description   Final    URINE, CATHETERIZED Performed at [facility], [address] [place], [place] [address]    Special Requests   Final    Immunocompromised Performed at [facility], [address] [place], [place] [address]    Culture (A)  Final    >=100,000 COLONIES/mL ENTEROCOCCUS FAECALIS 60,000 COLONIES/mL ESCHERICHIA COLI    Report Status 10/02/2019 FINAL  Final   Organism ID, Bacteria ENTEROCOCCUS FAECALIS (A)  Final   Organism ID, Bacteria ESCHERICHIA COLI (A)  Final      Susceptibility   Escherichia coli - MIC*    AMPICILLIN >=32 RESISTANT Resistant     CEFAZOLIN <=4 SENSITIVE Sensitive     CEFTRIAXONE <=0.25 SENSITIVE Sensitive     CIPROFLOXACIN <=0.25 SENSITIVE Sensitive     GENTAMICIN <=1 SENSITIVE Sensitive     IMIPENEM <=0.25 SENSITIVE Sensitive     NITROFURANTOIN <=16 SENSITIVE Sensitive     TRIMETH/SULFA <=20 SENSITIVE Sensitive     AMPICILLIN/SULBACTAM 16 INTERMEDIATE Intermediate     PIP/TAZO <=4 SENSITIVE Sensitive     * 60,000 COLONIES/mL ESCHERICHIA COLI   Enterococcus faecalis - MIC*    AMPICILLIN <=2 SENSITIVE Sensitive     NITROFURANTOIN <=16 SENSITIVE Sensitive     VANCOMYCIN 1 SENSITIVE Sensitive     * >=100,000 COLONIES/mL ENTEROCOCCUS FAECALIS      Management plans discussed with the patient, family and they are in agreement.  CODE STATUS:     Code Status Orders  (From admission, onward)         Start     Ordered   09/29/19 1254  Full code  Continuous        09/29/19 1309        Code Status History    Date Active Date Inactive Code Status Order ID Comments User Context   01/15/2017 2127 02/11/2017 1733 Full Code [id number]  Houston Siren, MD ED   01/01/2017 2006 01/03/2017 1805 Full Code [id number]  Shaune Pollack, MD ED   12/19/2016 0135 12/20/2016 1557 Full Code [id number]  Oralia Manis, MD Inpatient   09/12/2016 0513 09/13/2016 1812 Full Code [id number]  Arnaldo Natal, MD Inpatient   08/14/2016 1930 08/15/2016 1942  Full Code [id number]  Alford Highland, MD  ED   07/06/2016 0749 07/07/2016 2105 Full Code [id number]  Harrie Foreman, MD Inpatient   Advance Care Planning Activity      TOTAL TIME TAKING CARE OF THIS PATIENT: 40 minutes.    Loletha Grayer M.D on 10/05/2019 at 5:05 PM  Between 7am to 6pm - Pager - [phone]  After 6pm go to [email or web address] - password [email or web address]  [facility]  CC: Primary care physician; Clovia Cuff, MD

## 2019-10-05 NOTE — Progress Notes (Signed)
CRITICAL CARE PROGRESS NOTE    Name: Jessica [name] MRN: [id number] DOB: July 24, 1945     LOS: 6   SUBJECTIVE FINDINGS & SIGNIFICANT EVENTS   Patient description:  Jessica [name] an 74 y.o.femalewith a PMH ofChronic pain, Diastolic CHF, Hyperlipidemia, Hypertension, Hypothyroidism, Morbid obesity, chronic indwelling Urinary catheter use, recurrent UTI, spinal stenosis and visual hallucinationadmitted with severe sepsis with septic shock with lactic acidosis secondary to catheter associated urinary tract infection.  Has been bedbound baseline.  She also has a chronic Foley catheter due to large decubitus ulcer in the sacrum.   Lines / Drains: PIVx2  Cultures / Sepsis markers: Urine culture +for Ecoli  Antibiotics: Ancef   Protocols / Consultants: Hospitalist, pccm  Tests / Events: TTE, CXR, lasix challenge  10/03/19- patient diuresed >2L in past 24h.  She is improved with decreased O2 requirement. Will continue medical mgt today with plan to transfer to [facility] in am if patient continues to improve. 10/04/19- Patient reports clinical improvement, she is borderline hypotensive. She is on lower dose lasix. Discussed short term care plan with son today and husband.  Patient optimizing for transfer to floor. Hospitalist team is following paitnet.  10/05/19- patient is being optimized for d/c home.  She is clear to auscultation this am and has much improved peripheral edema. I discussed and answered questions with husband and son at bedside and reivewed care plan with attending physician today.   PAST MEDICAL HISTORY   Past Medical History:  Diagnosis Date  . Chronic pain   . Depression   . Diastolic heart failure (HCC)   . HLD (hyperlipidemia)   . HTN (hypertension)   . Hypothyroidism   . Morbid  obesity (HCC)   . Recurrent UTI   . Spinal stenosis   . Visual hallucinations      SURGICAL HISTORY   Past Surgical History:  Procedure Laterality Date  . ABDOMINAL HYSTERECTOMY    . BACK SURGERY    . HEMORROIDECTOMY    . IR RADIOLOGIST EVAL & MGMT  09/01/2017     FAMILY HISTORY   Family History  Problem Relation Age of Onset  . Heart disease Father   . Deep vein thrombosis Father      SOCIAL HISTORY   Social History   Tobacco Use  . Smoking status: Never Smoker  . Smokeless tobacco: Former Neurosurgeon    Types: Snuff  Vaping Use  . Vaping Use: Never used  Substance Use Topics  . Alcohol use: No  . Drug use: No     MEDICATIONS   Current Medication:  Current Facility-Administered Medications:  .  0.9 %  sodium chloride infusion, 250 mL, Intravenous, Continuous, Lowella Bandy, RPH, Paused at 10/03/19 1213 .  acetaminophen (TYLENOL) tablet 650 mg, 650 mg, Oral, Q4H PRN, Salena Saner, MD .  albumin human 25 % solution 12.5 g, 12.5 g, Intravenous, Daily, Vida Rigger, MD, Paused at 10/04/19 1642 .  baclofen (LIORESAL) tablet 5 mg, 5 mg, Oral, TID, Salena Saner, MD, 5 mg at 10/04/19 2124 .  cephALEXin (KEFLEX) capsule 500 mg, 500 mg, Oral, Q8H, Wieting, Richard, MD .  Chlorhexidine Gluconate Cloth 2 % PADS 6 each, 6 each, Topical, Daily, Salena Saner, MD, 6 each at 10/04/19 2125 .  docusate sodium (COLACE) capsule 100 mg, 100 mg, Oral, BID PRN, Salena Saner, MD, 100 mg at 10/03/19 1137 .  furosemide (LASIX) injection 20 mg, 20 mg, Intravenous, Daily, Vida Rigger, MD, 20 mg  at 10/04/19 1029 .  heparin injection 5,000 Units, 5,000 Units, Subcutaneous, Q12H, Tyler Pita, MD, 5,000 Units at 10/04/19 2125 .  levothyroxine (SYNTHROID) tablet 150 mcg, 150 mcg, Oral, Q0600, Tyler Pita, MD, 150 mcg at 10/05/19 0529 .  metoprolol succinate (TOPROL-XL) 24 hr tablet 12.5 mg, 12.5 mg, Oral, Daily, Wieting, Richard, MD, 12.5 mg at 10/04/19  1414 .  midodrine (PROAMATINE) tablet 10 mg, 10 mg, Oral, TID WC, Wieting, Richard, MD .  ondansetron [facility]) injection 4 mg, 4 mg, Intravenous, Q6H PRN, Tyler Pita, MD .  pantoprazole (PROTONIX) injection 40 mg, 40 mg, Intravenous, QHS, Tyler Pita, MD, 40 mg at 10/04/19 2125 .  polyethylene glycol (MIRALAX / GLYCOLAX) packet 17 g, 17 g, Oral, Daily PRN, Tyler Pita, MD .  pregabalin (LYRICA) capsule 100 mg, 100 mg, Oral, TID, Tyler Pita, MD, 100 mg at 10/04/19 2124 .  vitamin B-12 (CYANOCOBALAMIN) tablet 1,000 mcg, 1,000 mcg, Oral, Daily, Tyler Pita, MD, 1,000 mcg at 10/04/19 1017 .  zinc oxide (BALMEX) 11.3 % cream, , Topical, BID, Sharen Hones, MD, 1 application at 84/69/62 2126    ALLERGIES   Ambien [zolpidem tartrate] and Zinc    REVIEW OF SYSTEMS    10 point ROS done and is negative except as per HPI and subjective findings.   PHYSICAL EXAMINATION   Vital Signs: Temp:  [98.1 F (36.7 C)] 98.1 F (36.7 C) (06/23 1921) Pulse Rate:  [75-111] 91 (06/24 0400) Resp:  [15-22] 16 (06/24 0400) BP: (91-132)/(46-74) 112/51 (06/24 0400) SpO2:  [91 %-97 %] 93 % (06/24 0400) FiO2 (%):  [24 %] 24 % (06/23 1930) Weight:  [97.3 kg] 97.3 kg (06/24 0500)  GENERAL:Chronically ill appearing HEAD: Normocephalic, atraumatic.  EYES: Pupils equal, round, reactive to light.  No scleral icterus.  MOUTH: Moist mucosal membrane. NECK: Supple. No thyromegaly. No nodules. No JVD.  PULMONARY: bilateral crackles CARDIOVASCULAR: S1 and S2. Regular rate and rhythm. No murmurs, rubs, or gallops.  GASTROINTESTINAL: Soft, nontender, non-distended. No masses. Positive bowel sounds. No hepatosplenomegaly.  MUSCULOSKELETAL: 3+ pitting lower extermity edema  NEUROLOGIC: Mild distress due to acute illness SKIN:intact,warm,dry   PERTINENT DATA     Infusions: . sodium chloride Stopped (10/03/19 1213)  . albumin human Stopped (10/04/19 1642)   Scheduled  Medications: . baclofen  5 mg Oral TID  . cephALEXin  500 mg Oral Q8H  . Chlorhexidine Gluconate Cloth  6 each Topical Daily  . furosemide  20 mg Intravenous Daily  . heparin injection (subcutaneous)  5,000 Units Subcutaneous Q12H  . levothyroxine  150 mcg Oral Q0600  . metoprolol succinate  12.5 mg Oral Daily  . midodrine  10 mg Oral TID WC  . pantoprazole (PROTONIX) IV  40 mg Intravenous QHS  . pregabalin  100 mg Oral TID  . vitamin B-12  1,000 mcg Oral Daily  . zinc oxide   Topical BID   PRN Medications: acetaminophen, docusate sodium, ondansetron (ZOFRAN) IV, polyethylene glycol Hemodynamic parameters:   Intake/Output: 06/23 0701 - 06/24 0700 In: 860.5 [P.O.:480; I.V.:40.7; IV Piggyback:339.8] Out: 1150 [Urine:1150]  Ventilator  Settings: FiO2 (%):  [24 %] 24 %   LAB RESULTS:  Basic Metabolic Panel: Recent Labs  Lab 09/30/19 0443 09/30/19 0443 10/01/19 0428 10/01/19 0428 10/03/19 1123 10/03/19 1123 10/04/19 0612 10/05/19 0405  NA 134*  --  134*  --  141  --  142 143  K 3.1*   < > 3.5   < > 4.3   < >  2.3* 3.6  CL 95*  --  97*  --  101  --  99 100  CO2 28  --  26  --  31  --  32 32  GLUCOSE 90  --  67*  --  85  --  129* 162*  BUN 13  --  12  --  6*  --  5* 8  CREATININE 0.83  --  0.78  --  0.65  --  0.71 0.66  CALCIUM 7.8*  --  8.0*  --  8.1*  --  8.0* 8.0*  MG 1.3*  --  2.6*  --  2.0  --  1.8  --   PHOS 3.4  --  2.8  --  3.2  --   --   --    < > = values in this interval not displayed.   Liver Function Tests: Recent Labs  Lab 09/29/19 0840 09/30/19 0443 10/01/19 0428  AST 54* 44* 39  ALT 28 21 19   ALKPHOS 174* 154* 132*  BILITOT 1.8* 2.0* 1.8*  PROT 5.2* 5.1* 4.8*  ALBUMIN 1.6* 2.0* 2.4*   No results for input(s): LIPASE, AMYLASE in the last 168 hours. No results for input(s): AMMONIA in the last 168 hours. CBC: Recent Labs  Lab 09/29/19 0840 09/30/19 0443 10/01/19 0428 10/04/19 0612 10/05/19 0405  WBC 22.3* 22.0* 8.7 11.8* 6.8  NEUTROABS  17.9*  --   --  7.1  --   HGB 12.3 11.5* 10.1* 11.2* 10.1*  HCT 36.2 33.4* 29.7* 32.3* 30.6*  MCV 81.5 80.3 81.1 80.1 83.4  PLT 276 255 180 212 218   Cardiac Enzymes: No results for input(s): CKTOTAL, CKMB, CKMBINDEX, TROPONINI in the last 168 hours. BNP: Invalid input(s): POCBNP CBG: Recent Labs  Lab 09/29/19 1436 09/29/19 1438 10/01/19 0545 10/01/19 0620 10/01/19 0734  GLUCAP 49* 130* 56* 63* 95       IMAGING RESULTS:  Imaging: No results found. @PROBHOSP @ No results found.      ASSESSMENT AND PLAN    -Multidisciplinary rounds held today  Acute Hypoxic Respiratory Failure-improved -due to pulmonary edema cannot rule out infectious infiltrate  - continue ancef -patient diuresed well today with slight improvement -CXR reviewed with b/l pulm edema   Acute on Chronic diastolic CHF -repeat TTE today  -diuresed well during dayshift today oxygen as needed ICU telemetry monitoring -net negative post diuresis with significant impromvenet of periopheral edema  Moderate protein calorie malnutrition -albumin low -low peripheral muscle bulk  -dietary consultation   Altered mental status with encephalopathy-significantly imrpoved  - likely due to septic nephropathy   -treat underlying etiology  - discussed with family at bedside - no hx of substance abuse or dementia   Septic shock-resolved -present on admission  -due to UTI with ecoli   - contineu Ancef  Sacral decubitus ulceration    -wound care consult - appreciate input                   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 Patient is followed by the outpatient wound care center at [facility], last seen 08/15/19.  Wound type: Pressure Pressure Injury POA: Yes Measurement: 5cm x 3cm x 1.6cm with 2cm undermining at 12 o'clock.   ID -continue IV abx as prescibed -follow up cultures  GI/Nutrition GI PROPHYLAXIS as indicated DIET-->TF's as tolerated Constipation protocol as indicated  ENDO -  ICU hypoglycemic\Hyperglycemia protocol -check FSBS per protocol   ELECTROLYTES -follow labs as needed -  replace as needed -pharmacy consultation   DVT/GI PRX ordered -SCDs  TRANSFUSIONS AS NEEDED MONITOR FSBS ASSESS the need for LABS as needed     This document was prepared using Dragon voice recognition software and may include unintentional dictation errors.    Vida Rigger, M.D.  Division of Pulmonary & Critical Care Medicine  [facility]

## 2019-10-05 NOTE — Progress Notes (Signed)
PHARMACY CONSULT NOTE  Pharmacy Consult for Electrolyte Monitoring and Replacement   Recent Labs: Potassium (mmol/L)  Date Value  10/05/2019 3.6   Magnesium (mg/dL)  Date Value  52/48/1859 1.8   Calcium (mg/dL)  Date Value  09/31/1216 8.0 (L)   Albumin (g/dL)  Date Value  24/46/9507 2.4 (L)   Phosphorus (mg/dL)  Date Value  22/57/5051 3.2   Sodium (mmol/L)  Date Value  10/05/2019 143     Assessment: 74 year old female with E.coli bacteremia. Pharmacy consulted for electrolyte replacement.  Goal of Therapy:  Electrolytes WNL  Plan:  Electrolytes WNL. Plan to discharge today. No follow up.  Pricilla Riffle ,PharmD Clinical Pharmacist 10/05/2019 1:21 PM

## 2019-10-05 NOTE — Progress Notes (Signed)
Transferred home via non emergent EMS.

## 2019-10-05 NOTE — Consult Note (Signed)
Physical Medicine and Rehabilitation Consult Reason for Consult: Impaired mobility and ADLs Referring Physician: Alford Highland, MD   HPI: Jessica [name] is a 74 y.o. female with a PMH of chronic pain, diastolic CHF, HLD, HTN, hypothyroidism, morbid obesity, chronic indwelling urinary catheter use, recurrent UTI, spinal stenosis, and visual hallucination, who was admitted with severe sepsis with septic shock and lactic acidosis secondary to catheter associated UTI. She is bedbound at baseline. Her chronic foley catheter is due to a large sacral decubitus ulcer. Urine culture is positive for Ecoli and she is being treated with Ancef. She has been diuresed with Lasix and has had a decreasing O2 requirement. Son and husband are involved in her care. She is being discharged home today.   Review of Systems  Constitutional: Negative for chills and fever.  HENT: Negative for hearing loss and tinnitus.   Eyes: Negative for blurred vision and double vision.  Respiratory: Negative for cough and hemoptysis.   Cardiovascular: Negative for chest pain and palpitations.  Gastrointestinal: Negative for heartburn and nausea.  Genitourinary: Negative for dysuria and urgency.  Musculoskeletal: Negative for myalgias and neck pain.  Skin: Negative for itching and rash.  Neurological: Negative for dizziness and headaches.  Endo/Heme/Allergies: Negative for environmental allergies. Bruises/bleeds easily.  Psychiatric/Behavioral: Negative for memory loss. The patient does not have insomnia.    Past Medical History:  Diagnosis Date  . Chronic pain   . Depression   . Diastolic heart failure (HCC)   . HLD (hyperlipidemia)   . HTN (hypertension)   . Hypothyroidism   . Morbid obesity (HCC)   . Recurrent UTI   . Spinal stenosis   . Visual hallucinations    Past Surgical History:  Procedure Laterality Date  . ABDOMINAL HYSTERECTOMY    . BACK SURGERY    . HEMORROIDECTOMY    . IR RADIOLOGIST EVAL  & MGMT  09/01/2017   Family History  Problem Relation Age of Onset  . Heart disease Father   . Deep vein thrombosis Father    Social History:  reports that she has never smoked. She has quit using smokeless tobacco.  Her smokeless tobacco use included snuff. She reports that she does not drink alcohol and does not use drugs. Allergies:  Allergies  Allergen Reactions  . Ambien [Zolpidem Tartrate] Other (See Comments)    Reaction: altered mental status  . Zinc Other (See Comments)    Zinc ointments cause skin irritation   Medications Prior to Admission  Medication Sig Dispense Refill  . acetaminophen (TYLENOL) 325 MG tablet Take 650 mg by mouth every 6 (six) hours as needed for mild pain.    Marland Kitchen albuterol (PROVENTIL HFA;VENTOLIN HFA) 108 (90 Base) MCG/ACT inhaler Inhale 2 puffs into the lungs every 4 (four) hours as needed for wheezing or shortness of breath.    Marland Kitchen albuterol (PROVENTIL) (2.5 MG/3ML) 0.083% nebulizer solution Take 2.5 mg by nebulization every 6 (six) hours as needed for wheezing or shortness of breath.    Marland Kitchen amlodipine-atorvastatin (CADUET) 2.5-10 MG tablet amlodipine 2.5 mg-atorvastatin 10 mg tablet  Take 1 tablet every day by oral route.    . ARIPiprazole (ABILIFY) 2 MG tablet Take 2 mg by mouth daily.   4  . aspirin EC 81 MG tablet Take 81 mg by mouth daily.    . baclofen (LIORESAL) 20 MG tablet Take 20 mg by mouth 2 (two) times daily. 10 mg every morning and 20 mg every evening    .  collagenase (SANTYL) ointment Apply topically daily. 15 g 0  . docusate sodium (COLACE) 100 MG capsule Take 100 mg by mouth 2 (two) times daily.    . hydrocortisone 2.5 % cream Apply topically 2 (two) times daily.    Marland Kitchen levothyroxine (SYNTHROID, LEVOTHROID) 150 MCG tablet Take 150 mcg by mouth daily before breakfast.    . nystatin cream (MYCOSTATIN) Apply 1 application topically 2 (two) times daily.    . ondansetron (ZOFRAN-ODT) 4 MG disintegrating tablet Take 4 mg by mouth every 8 (eight) hours  as needed for nausea or vomiting.    . polyethylene glycol (MIRALAX / GLYCOLAX) packet Take 17 g by mouth daily.    . potassium chloride (KLOR-CON) 10 MEQ tablet Take 10 mEq by mouth daily.    . pregabalin (LYRICA) 100 MG capsule Take 100 mg by mouth 3 (three) times daily.     . sennosides-docusate sodium (SENOKOT-S) 8.6-50 MG tablet Take 1 tablet by mouth daily.    . traZODone (DESYREL) 50 MG tablet Take 50 mg by mouth at bedtime.    . Venlafaxine HCl 225 MG TB24 Take 225 mg by mouth daily.    . vitamin B-12 (CYANOCOBALAMIN) 1000 MCG tablet Take 1,000 mcg by mouth daily.    . Vitamin D, Ergocalciferol, (DRISDOL) 50000 units CAPS capsule Take 50,000 Units by mouth every 30 (thirty) days.    . [DISCONTINUED] furosemide (LASIX) 20 MG tablet Take 20 mg by mouth daily.       Home: Home Living Family/patient expects to be discharged to:: Private residence Living Arrangements: Spouse/significant other, Children  Functional History:   Functional Status:  Mobility:          ADL:    Cognition: Cognition Orientation Level: (P) Oriented to person, Oriented to place, Oriented to time    Blood pressure 119/70, pulse 91, temperature 98.1 F (36.7 C), temperature source Oral, resp. rate 16, height 5\' 10"  (1.778 m), weight 97.3 kg, SpO2 93 %. Physical Exam   General: Alert and oriented x 3, No apparent distress. Fatigued. Son and husband at bedside HEENT: Head is normocephalic, atraumatic, PERRLA, EOMI, sclera anicteric, oral mucosa pink and moist, dentition intact, ext ear canals clear,  Neck: Supple without JVD or lymphadenopathy Heart: Reg rate and rhythm. No murmurs rubs or gallops Chest: CTA bilaterally without wheezes, rales, or rhonchi; no distress Abdomen: Soft, non-tender, non-distended, bowel sounds positive. Extremities: 3+ bilateral lower extremity edema. Left hand fluid filled.  Skin: Clean and intact without signs of breakdown Neuro: Pt is cognitively appropriate with  normal insight, memory, and awareness. MAS 2 left elbow flexor. Slow and 3/5 elbow flexion left arm.  Psych: Flat affect   Results for orders placed or performed during the hospital encounter of 09/29/19 (from the past 24 hour(s))  CBC     Status: Abnormal   Collection Time: 10/05/19  4:05 AM  Result Value Ref Range   WBC 6.8 4.0 - 10.5 K/uL   RBC 3.67 (L) 3.87 - 5.11 MIL/uL   Hemoglobin 10.1 (L) 12.0 - 15.0 g/dL   HCT 30.6 (L) 36 - 46 %   MCV 83.4 80.0 - 100.0 fL   MCH 27.5 26.0 - 34.0 pg   MCHC 33.0 30.0 - 36.0 g/dL   RDW 18.5 (H) 11.5 - 15.5 %   Platelets 218 150 - 400 K/uL   nRBC 0.0 0.0 - 0.2 %  Basic metabolic panel     Status: Abnormal   Collection Time: 10/05/19  4:05 AM  Result Value Ref Range   Sodium 143 135 - 145 mmol/L   Potassium 3.6 3.5 - 5.1 mmol/L   Chloride 100 98 - 111 mmol/L   CO2 32 22 - 32 mmol/L   Glucose, Bld 162 (H) 70 - 99 mg/dL   BUN 8 8 - 23 mg/dL   Creatinine, Ser 6.07 0.44 - 1.00 mg/dL   Calcium 8.0 (L) 8.9 - 10.3 mg/dL   GFR calc non Af Amer >60 >60 mL/min   GFR calc Af Amer >60 >60 mL/min   Anion gap 11 5 - 15   No results found.  Assessment and Plan: Mrs. Schlosser is a 74 year old woman who was admitted with septic shock. She is recovering very well and is stable for discharge today. She does not follow with a physiatrist but has impaired mobility and ADLs, spasticity managed on baclofen, and a sacral pressure injury and would benefit from outpatient physiatry management. I will refer her to our practice where she can follow by video/phone due to her difficulty in getting to appointments.   For her spasticity, I will send for her Baclofen 5mg  TID to her outpatient pharmacy as son says they are out of medication. She would be a good candidate for Botox but getting her to and from appointments would be a challenge for her.  For her pressure injury I will communicate with her wound care center where she is receiving care. Advised son and father to  reposition her q2H.   For her decreased appetite, discussed good sources of protein that she likes, Zinc and Vitamin C supplementation for wound healing. Discussed one bowl of yogurt with blueberries and walnuts daily for protein and antioxidants and family felt she would like this.   For her lower extremity edema, I recommended that she elevate her legs, get large compression garments from her pharmacy, ice, and continue Lasix as prescribed by Dr. .   Thank you for this consult. I will continue to follow in Mrs. Rabine's care. Provided her son with my cell phone number should he have additional questions.   Renae Gloss, MD 10/05/2019

## 2019-10-07 NOTE — Progress Notes (Signed)
   10/05/19 0830  Clinical Encounter Type  Visited With Patient and family together  Visit Type Follow-up  Referral From Chaplain  Consult/Referral To Chaplain  Chaplain visited with patient and family several times today. The first time chaplain walked the room, her son was lying on her arm and she said hi. Chaplain stopped in astonishment because she could hear and understand what [name] was saying.Patient's son told me that his mother might be discharged today. Later chaplain came back and it was confirmed that patient was being discharged. Chaplain thanked the family for allowing her into their space doing their time at [facility]. Son Jessica [name] thanked chaplain for all that she had done. Chaplain wished them well and left.

## 2019-10-19 ENCOUNTER — Encounter: Payer: Medicare Other | Attending: Physician Assistant | Admitting: Physician Assistant

## 2019-10-19 ENCOUNTER — Other Ambulatory Visit: Payer: Self-pay

## 2019-10-19 DIAGNOSIS — R54 Age-related physical debility: Secondary | ICD-10-CM | POA: Insufficient documentation

## 2019-10-19 DIAGNOSIS — E782 Mixed hyperlipidemia: Secondary | ICD-10-CM | POA: Diagnosis not present

## 2019-10-19 DIAGNOSIS — G894 Chronic pain syndrome: Secondary | ICD-10-CM | POA: Insufficient documentation

## 2019-10-19 DIAGNOSIS — L89154 Pressure ulcer of sacral region, stage 4: Secondary | ICD-10-CM | POA: Diagnosis present

## 2019-10-19 DIAGNOSIS — Z7401 Bed confinement status: Secondary | ICD-10-CM | POA: Insufficient documentation

## 2019-10-19 DIAGNOSIS — E669 Obesity, unspecified: Secondary | ICD-10-CM | POA: Diagnosis not present

## 2019-10-19 NOTE — Progress Notes (Addendum)
Jessica [name], Jessica [name] ([id number]) Visit Report for 10/19/2019 Chief Complaint Document Details [name], Jessica [name]. Date of Service: 10/19/2019 10:00 AM Medical Record Number: [id number] Patient Account Number: [id number] Date of Birth/Sex: Jul 24, 1945 (74 y.o. F) Treating RN: Huel Coventry Primary Care Provider: Annita Brod Other Clinician: Referring Provider: Annita Brod Treating Provider/Extender: Linwood Dibbles, Deniqua Perry Weeks in Treatment: 125 Information Obtained from: Patient Chief Complaint she is here for evaluation of a sacral ulcer Electronic Signature(s) Signed: 10/19/2019 10:41:51 AM By: Lenda Kelp PA-C Entered By: Lenda Kelp on 10/19/2019 10:41:51 Jessica [name] ([id number]) -------------------------------------------------------------------------------- HPI Details [name]. Date of Service: 10/19/2019 10:00 AM Medical Record Number: [id number] Patient Account Number: [id number] Date of Birth/Sex: 08-19-1945 (74 y.o. F) Treating RN: Huel Coventry Primary Care Provider: Annita Brod Other Clinician: Referring Provider: Annita Brod Treating Provider/Extender: Linwood Dibbles, Betsabe Iglesia Weeks in Treatment: 125 History of Present Illness HPI Description: 05/27/17-she is here in initial evaluation for a left-sided sacral stage IV pressure ulcer and bilateral lower extremity, lateral aspect, unstageable pressure ulcers. She is accompanied by her husband and her son, who are her primary caregivers. She is bedbound secondary to spinal stenosis. According to her son and husband she was hospitalized from 10/5-11/1 with healthcare associated pneumonia and altered mental status. During her hospitalization she was intubated, extubated on 10/27. She was discharged with Foley catheter and follow up with urology. According to her son and spouse she developed the sacral ulcer during hospitalization. Home health has been applying Santyl daily. She does have a low  air loss mattress and is repositioned every 2-3 hours per her family's report. According to the son and spouse she had an appointment with urology on 12/18 and during that appointment developed discoloration to her bilateral lower extremities which ultimately developed into unstageable pressure ulcers to the lateral aspects of her bilateral lower extremities. There has been no topical treatment applied to these. She continues to have home health. There is no concerns expressed regarding dietary intake, stating she eats 3 meals a day, eating was provided; she is supplemented with boost with protein. 06/03/17-she is here in follow-up evaluation for sacral and bilateral lower extremity ulcers. Plain film x-ray done today reveals no distraction to the sacrum or coccyx, no visible abnormalities. Home health has ordered the negative pressure wound system but it has not been initiated. We will continue with Hydrofera Blue until initiation of negative pressure wound system and continue with Santyl to bilateral lower extremity ulcers. Follow-up next week 06/10/17-she is here in follow-up evaluation for sacral and bilateral lower extremity ulcers. The wound VAC will be available tomorrow per home health. We will initiate wound VAC therapy to the sacral ulcer 3 times weekly (Thursday, Saturday/Sunday, Tuesday). We will continue with Santyl to the lower extremity ulcers. The patient's son is checking into home health therapy over the weekend for [facility] changes, with the understanding that if VAC changes cannot be performed over the weekend he will need to change his mother's appointments to Monday, Wednesday or Friday. The sacral ulcer clinically does not appear infected but there has been a change in the amount of drainage acutely, there is no significant amount of devitalized tissue, there is no malodor. Wound culture was obtained to evaluate for occult infection we will hold off on antibiotic therapy  until sensitivities are resulted. 06/18/17 on evaluation today patient appears to be doing fairly well in my opinion although this is the first time I have seen this patient she  has been previously evaluated by Denny Peon here in our office. She is going to be switching to Fridays to see me due to the Wound VAC schedule being changed on Monday, Wednesday, and Friday. Subsequently she seems to be doing fairly well with the Wound VAC. Her son who was present during evaluation today states that he somewhat stresses of the Wound VAC in making sure that it was functioning appropriately. With that being said everything seems to be working well he knows what settings on the [facility] itself to look at and ensure that it is functioning properly. Overall the wound appears to be nice and clean there is no need for debridement today. She has no discomfort in her bilateral lower extremity ulcers also appear to be improving based on measurements and what this honest tell me about the overall appearance. 07/02/17 on evaluation today I noted in patients wound bed that was actually an odor that had not previously been noted. Subsequently there was also a small area of bone that was not previously noted during my last evaluation. This did appear to be necrotic and was being somewhat forced out by the body around the region of granulation. She does not have any pain which is good news. Nonetheless the overall appearance of the ulcer is making me concerned for the patient having developed osteomyelitis. Currently she is not been on any antibiotics and the Wound VAC has been doing fairly well in general. With that being said I do not think we need to continue the Wound VAC if she potentially has a bone infection. At least not until it is properly addressed with antibiotics. 07/09/17 on evaluation today patient appears to actually be doing very well in regard to her sacral ulcer compared to last week. There is actually no exposed bone  at this point. 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 With that being said still I do believe she needs to have a referral to infectious disease due to the osteomyelitis I am gonna recommend that she continue with the doxycycline based on the culture results which showed Eikenella Corrodens as the organism identified that tetracycline should work for. She does not seem to be having any discomfort whatsoever at this point. 07/16/17 on evaluation today patient actually appears to be doing rather well in regard to her sacral wound. I think that the original wound site actually appears much better than previously noted. With that being said she does have a new superficial injury on the right sacral region which appears to be due to according to the sign transport that occurred for her MRI unfortunately. Fortunately this is not too deep and I do think it can be managed but it was a new area that was not previously noted. Otherwise she has been tolerating the Dakinos soaked gauze packing without complication. 07/30/17 on evaluation today patient presents for reevaluation concerning her sacral wound. She has been tolerating the dressing changes without complication. With that being said her wound is doing so well at this point that I think she may be at the point where we could reinitiate the Wound VAC currently and hopefully see good results from this. I'm very pleased with how she has responded to the Dakinos soaked gauze packing. 08/13/17 evaluation today patient appears to be doing excellent in regard to her lower extremity ulcer this seems to  be cleaning up very nicely. In regard to the sacral ulcer the area of trauma on the right lateral portion of the wound actually appears to be much better than previously noted during the last evaluation. The  word has definitely filled in and the Wound VAC appears to be helpful I do believe. Overall I'm very pleased with how things seem to be progressing at this time. Patient likewise is also happy that things are doing well. 08/27/17 on evaluation today patient presents for follow-up concerning her ongoing issues with her sacral ulcer and lower extremity ulcer. Fortunately she has been tolerating the dressing changes well complication the Wound VAC in general seems to have done very well up to this point. She does not have any evidence of infection which is good news. She does have some dark discoloration in central portion of the wound which is troublesome for the possibility of pressure injury to the site it sounds like her prior air mattress was not functioning properly her son has SUKHMAN, KOCHER. ([id number]) just bought a new one for her this is doing much better. Otherwise things seem to be progressing nicely. 09/10/17 on evaluation today patient presents for follow-up concerning her sacral wound and lower extremity ulcer. She has been tolerating the switch of her dressing changes to the Dakinos soaked gauze dressing very well in regard to the sacrum. The left lateral lower extremity ulcer appears to have a new area open just distal to the one that we have been treating with Santyl and this is new since her last visit. There does not appear to be any evidence of infection which is good news. With that being said there's a lot of maceration here at the site and I feel this is likely due to the switch and the dressings it appears that due to the sticking of the dressings it will switch to utilizing a telfa pad over the Santyl. I think this caused a lot of drainage to be collected and situated in the region just under the dressing which has led to this causing which duration of breakdown. Overall there does not appear to be any significant pain which is good news. Of note I did actually have a  conversation with the radiologist who is an interventional radiologist with [facility]. 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 With that being said there does not appear to be any rush to get this done according to the interventional radiologist whom I spoke with. Therefore after discussion it was determined that there gonna wait about six months prior to considering the procedure to get time for hopefully the sacral wound to heal in the interim to at least some degree. 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 According to nursing staff these are all improved, the negative pressure wound therapy system was put on hold last week. We will resume negative pressure wound therapy today, continue with Santyl to the lower extremity and she will follow-up next week. 09/24/17 on evaluation today unfortunately the patient's wound appears to be doing significantly worse compared to last week's evaluation and even my valuation the week before. She actually has bone noted on the left wound margin in the region of undermining unfortunately. This was not noted during the last evaluation. I am concerned about the fact that this seems to be worse not better since our last visit with her.  My biggest concern is that she's likely developing a worsening osteomyelitis. This was discussed with patient and her son today during the office visit. 05/03/17 on evaluation today patient actually appears to be doing rather well in regard to her sacral ulcer compared to last week's evaluation. She has been tolerating the dressing changes without complication. Fortunately even though we're not utilizing the Wound VAC she seems to be making some strides in seeing the wound area overall improved quite dramatically in my pinion in just one weeks time. She also seems to be staying off of this to the point  that I do not see any evidence of new injury which is excellent news. Whatever she's been doing in that regard over the past week I would want her to continue. I did also review the results of her bone culture which revealed group B strep as the responsible organism. Again this is what's causing her osteomyelitis she does have infectious disease appointment scheduled for 10/11/17 10/08/1898 valuation today patient appears to be doing better for the most part in regard to the sacral wound. Overall she is showing signs of having granulation of the bone which is good news. There is an area towards the left of the wound where she does seem to be showing some signs of opening this it would be due to additional pressure to the area unfortunately. There does not appear to be any evidence of infection spreading which is good news that do not see any evidence of significant purulent discharge which is also good news. 10/26/17 on evaluation today patient sacral ulcer actually appears to be very healthy and doing well in regard to the overall appearance of the wound. With that being said she does seem to be having some issues at this point in time with some shear/friction injury of the sacrum from where she was transported to [facility] for her infectious disease appointment. She has been placed on IV antibiotic therapy I will have to go back and find that note for review as I did not have access to that today. Nonetheless I will add the next visit be sure to document the exact antibiotics that she is taking at this point. Nonetheless I do believe the wound is doing better there's no bone exposure nothing that requires debridement in regard to the sacral wound. Likewise her left lateral lower extremity ulcer also appears to be doing significantly better. In general I feel like things are showing signs of improvement all around. 11/12/17 on evaluation today patient appears to be doing rather well in regard to her sacral  wound. In general I do see signs of granulation at this point. Fortunately there does not appear to be any evidence of infection which is good news as well. In general overall happy with the appearance. With that being said I'll be see I would like for this to be progressing more rapidly as with the patient and her son but nonetheless at least things do not appear to be doing worse. Her lower extremity ulcer is doing significantly better. 11/26/17 on evaluation today patient appears to be doing a little better in regard to the sacral wound especially in the peripheral of the wound bed. She actually did have an abrasion on the right gluteal region that's completely resolved to this point. Her lower extremity also appears to be for the most part completely resolved. Overall the sacrum itself seems to be the one thing that is still open and given her trouble. No fevers, chills,  nausea, or vomiting noted at this time. She actually has an appointment next week with infectious disease for recheck to see how things are doing. She maybe switch to oral medications at that point. 12/09/17 on evaluation today patient actually appears to be doing much better in regard to her sacral wound. I do feel like she has made good progress at this time as far as healing is concerned. In fact the wound looks better to me today than it has in quite some time. She is on oral antibiotic therapy and no longer on the IV antibiotics. She did see infectious disease last week. 12/24/17 on evaluation today patient's wounds actually appear to be doing decently well in regard to the sacral wound in particular. When it comes to her lower extremity ulcers both appear to be completely healed at this point in the one area where she had lived the rash has completely resolved at this time. Overall very pleased with the progress that has been made. Nonetheless the patient seems to be doing well in general. She still does have quite a bit of  healing to do in regard to the sacral wound but I feel like she is making progress. 01/07/18 on evaluation today patient actually appears to be doing excellent today regard to her sacral wound. The granular quality in the base of the wound is excellent and shown signs of good improvement there's no evidence of contusion nor deep tissue injury and the periwound actually appears to be doing well. In general I'm very pleased with the overall appearance. 02/04/18 on evaluation today patient's wound actually appears to be doing decently well as far as the granulation is concerned. She has been tolerating the dressing changes without complication. Right now we are still using the Wound VAC. Nonetheless overall there apparently is some cater to the wound and this is concerning simply for the fact that she could be developing a soft tissue infection again. She's had this happen in the past and at that time responded very well to the antibiotics. Nonetheless I don't think I would likely put her on anything prophylactically but rather wait for the results of the culture to return. 02/18/18 on evaluation today patient actually appears to be doing fairly well. She has been tolerating the dressing changes. And the Wound VAC seems to be doing well. Overall I'm very pleased with how things have improved over the past couple weeks since I last saw her. Fortunately there does not appear to be any evidence of overt infection at this time which is good news. She has been taking the antibiotics without complication. Specifically that is the Bactrim DS which was prescribed on 02/08/18. 03/18/18 on evaluation today patient's wound actually does have quite a bit of odor noted compared to previous. With that being said the wound itself overall does not appear to be too significant or worse. There is no event noted in the wound bed which is good news. With that being said the patient has been worried as the home health nurse  stated that she thought she saw bone in the base of the wound. Nonetheless there's a little Whitfill, Janina W. ([id number]) bit of bruising along the left lateral border of the wound bed which again does not appear to be terrible we have noticed this before but other than this the majority the wound bed appears to be doing excellent in my pinion. I am wondering if there may be some bacterial colonization. This could be leading to some  of the odor that we are noting with the Wound VAC. 04/01/18 on evaluation today patient appears to actually be doing well in regard to the overall appearance of the wound itself. She also does not have any odor at this point in regard to the wound surface and I do believe the Bacons is extremely helpful in this regard. With that being said she does have a little bit of deep tissue injury on the medial aspect of the wound. Again last time it was more lateral. The lateral seems to have improved quite well. Nonetheless she also notes by way of her son that this has been very macerated in the past several weeks since I last saw her. Though things overall seem to be doing better I still think that the Wound VAC may help with managing her fluid better I'm wondering if we can attempt a gauze Wound VAC my hope is that this will be able to manage the moisture control as well as continuing with managing the odor of the wound and bacterial load since the Dakinos has done excellent in helping in that regard. 04/15/17 on evaluation today patient actually appears to be doing much better in regard to the overall appearance of her sacral wound. I do believe that the gauze Vac has been of benefit for her. Overall she is tolerating this without complication which is also good news. There is no sign of injury to the sacral region at this point. 04/29/18 on evaluation today patient appears to be doing well in regard to her sacral ulcer. She sure signs of good granulation and the Gauze VAC is  doing very well for her. Overall I'm pleased in this regard. The biggest issue is that the time from Friday till Monday seems to be quite long for her as far as the amount of time to keep the Wound VAC sealed. For that reason her son wonders if he can take this off on Sunday morning in order to give her day of just wet to dry dressing's with the Dakin's solution I think this would be okay. 05/13/18 on evaluation today patient appears to be doing okay in regard to her sacral ulcer. There does not appear to be any evidence of overt infection at this time which is good news. In general the drainage and moisture appears to be significantly improved this was after the pressure on the Wound VAC was increased after they contacted our office. With that being said a lot of the dark tissue around the edge of the wound has improved in the maceration is significantly better this seems to be managing much better. Fortunately there is no signs of infection at this time. Overall the patient is doing quite well. The wound measurements are not significantly smaller today although they were over the past two visits we will see how things go. 05/27/18 on evaluation today patient's sacral wound actually appears to be doing fairly well at this point. She does have a deep tissue injury yet again on the medial aspect of the wound which is something that we seem to be seeing often. There is some question as to whether or not this is actually being caused by the Wound VAC bunching up and then her somewhat laying on it even though they are attempting to offload this as best as possible. Patient son states that overall they do the best they can trying offload her and I definitely believe him. I may want to see about putting the Wound VAC  on hold for a couple weeks and see if this happens just with standard dressing changes. 06/10/18 on evaluation today patient actually appears to be doing better in regard to her sacral wound.  Overall there's no signs of infection, no odor, and no significant skin breakdown around the edge of the wound I feel like this is done much better 50 Dakinos soaked gauze dressing as opposed to legalizing anything such as the Wound VAC. I believe that we discontinue the Wound VAC as of today. 06/24/18 on evaluation today patient actually appears to be doing well in regard to her sacral ulcer. There's no signs of infection, no odor, and in my pinion no concerns at all. I feel like she is making good progress as far as I'm concerned. 07/12/18 on evaluation today patient appears to be doing very well in regard to her sacral ulcer. She has been tolerating the dressing changes without complication. Fortunately there does not appear to be any evidence of active infection at this time she is doing excellent as far as I'm concerned at this point. 08/11/18 on evaluation today patient's sacral wound actually appears to be doing quite well at this point. The main issue I see is that she continues to have some maceration and skin breakdown due to moisture over the right periwound location unfortunately. We've investigated the possibility of using zinc oxide before but have not utilized it simply due to the fact that she continues to state. Her son anyways that she is allergic to this. They have not tried it recently and may be completely unrelated to the reaction she had before. Nonetheless this may be something you want to attempt will discuss that in the plan. 10/04/18 on evaluation today patient actually appears to be doing quite well in regard to her sacral wound. It's been roughly 2 months since I last seen her in that time her wound has definitely made progress. This is very slow but again nonetheless does seem to be trending in the correct direction. Overall very pleased in this regard. No fevers, chills, nausea, or vomiting noted at this time. 11/07/2018 on evaluation today patient appears to be doing  decently well with regard to her sacral wound based on what I am seeing at this point. She is seen by way of a telehealth visit due to the COVID-19 national emergency and to be honest the patient is somewhat reluctant to come out to be seen for visits which I completely understand. They feel much safer keeping her home as much as possible due to her adequate condition in general. The only issue that she has had recently she did have some wheezing and subsequently chest congestion she was just placed on albuterol along with prednisone at this point. She seems to be doing much better even after 24 hours of the prednisone according to her [name]. Overall they are pleased with how things seem to be progressing. 11/28/2018 upon evaluation today patient is seen by way of telehealth visit since she is still somewhat reluctant to come out due to the COVID-19 pandemic. Subsequently she seems to be doing well and infected I could not obtain actual measurements for the wound the overall appearance of the wound is dramatically improved. She does not appear to have nearly as much space at the 12:00 location and in fact her [name] who is taking care of her mainly states that he is barely able to get anything up into that region is filled and so nicely. Overall  I feel like she is making great progress. 12/12/2018 patient is seen today via a telehealth visit again secondary to her concerns about coming out during the COVID-19 national pandemic. Nonetheless she appears to be doing quite well at this time which is good news. There is no signs of active infection at this time. Overall patient seems to be very pleased as is her son who takes very good care of her. 01/16/2019 on evaluation today patient actually appears to be doing very well with regard to her wound. We have been doing zoom calls quite a bit since the COVID-19 pandemic began just due to the fact that she has been somewhat concerned about coming out.  With that being said she seems to have done very well and in fact the wound is measuring quite a bit smaller today compared to the last measurement. Fortunately there is no signs of active infection at this time. She is also having great improvement overall in her demeanor she is very upbeat and pleased with how things have progressed. 02/21/2019 upon evaluation today patient actually appears to be doing very well with regard to her wound. She has been tolerating the dressing changes her sons been performing these without complication and seems to do an excellent job in my opinion. I am actually very pleased with the Jessica [name], Jessica [name]. ([id number]) overall appearance. The wound is significantly smaller compared to what its been in the past by quite a bit. 03/21/19 on evaluation today patient appears to be doing excellent in regard to her wound. She's been tolerating the dressing changes without complication and there does not appear to be evidence of active infection which is good news. She is still tolerating the Dakinos Moistened gauze packing without complication. 04/18/19 on evaluation today patient is seen by way of a telehealth visit due to her concerns of being exposed to Covid-19 Virus if she comes into the clinic. Subsequently her son is present to help with the visit today. They have been performing the dressing changes specifically Dakins soloution at this point in order to help with granulation in the base of the wound. Fortunately there is no evidence of active infection currently. Her son does note that he feels like maybe some bone in the base of the wound centrally that he is somewhat concerned about. With that being said it's not entirely visible but he states that he can palpate it. 05/25/2019 upon evaluation today patient appears to be doing very well in fact in regard to her wounds. I am very pleased with how things seem to be progressing and though she is not showing any signs of  infection I do believe that it may be time to switch the dressing to something a little different I think she has a lot of irritation in the periwound and this is likely coming some from the fact that she does have a lot of moisture. I think the wound is small enough we can see about initiation of a silver alginate dressing. I think this will help keep the periwound from breaking down. Overall however I am very pleased with how things stand. 06/15/2019 upon evaluation today patient appears to be doing very well with regard to her wound. Overall the periwound also seems to be doing much better. She does have several filament-like projections of her skin on the periwound region that been present for some time unfortunately they keep getting pulled and stuck to the dressing causing this area to bleed. I think we are  getting need to shave/trim these down today to help her to more fully heal in this periwound location so this would not continue to be an issue for her. 07/06/2019 upon evaluation today patient actually appears to be doing excellent in regard to her wound. She is showing signs of improvement the wound is measuring smaller and is looking very well. In general I am extremely happy. The areas that we trimmed off and periwound where she had skin tags last week actually seem to have done excellent they never even bled again. 08/15/2019 upon evaluation today patient appears to actually be doing better with regard to her actual wound. Unfortunately the periwound does not appear to be doing quite as well at this time unfortunately. She had pneumonia over the past couple weeks that is why she did not make it into her appointment at the last visit. Subsequently she because of having pneumonia was not able to lay flat or even on her side as she could not breathe well therefore she spent a lot more time sitting up even sleeping sitting up as opposed to laying on her side. I think this is made some trouble for  her as far as the periwound of her sacral wound is concerned and that is why she is showing signs of the irritation of the skin around. Nonetheless I think this is something that can be managed at this point she now feels better than pneumonia has cleared and overall I believe that she is a much better place in that regard to be able to offload. 10/19/2019 upon evaluation today patient appears to be doing well with regard to her wound overall although the periwound unfortunately does have some breakdown today that has been somewhat concerned. Mainly I am concerned about the fact that again previously this was in very good shape now it appears to be very dry and not well protected despite the use of the same. We may want to try something different such as a AandE ointment or something of like to see if that can be beneficial for the patient. I really feel like the alginate is a good option although I think we need to make sure this is being packed into the wound bed apparently that was not the way the patient's son was doing this previously which I thought he had been to be honest. Nonetheless we will address that today and make appropriate recommendations. Electronic Signature(s) Signed: 10/19/2019 2:50:04 PM By: Lenda Kelp PA-C Entered By: Lenda Kelp on 10/19/2019 14:50:03 Jessica [name] ([id number]) -------------------------------------------------------------------------------- Physical Exam Details [name]. Date of Service: 10/19/2019 10:00 AM Medical Record Number: [id number] Patient Account Number: [id number] Date of Birth/Sex: 1945/08/11 (74 y.o. F) Treating RN: Huel Coventry Primary Care Provider: Annita Brod Other Clinician: Referring Provider: Annita Brod Treating Provider/Extender: STONE III, Rohn Fritsch Weeks in Treatment: 125 Constitutional Obese and well-hydrated in no acute distress. Respiratory normal breathing without  difficulty. Psychiatric this patient is able to make decisions and demonstrates good insight into disease process. Alert and Oriented x 3. pleasant and cooperative. Notes Upon inspection patient's wound bed actually showed signs of good granulation at this time which is good news. Unfortunately the periwound is a little bit more macerated and broken down at this time. I do believe that the silver alginate is probably still superior to the Dakin's moistened gauze but I do believe we need to pack this into the wound bed to prevent anything from leaking or draining and  causing significant issues here. Electronic Signature(s) Signed: 10/19/2019 2:51:17 PM By: Lenda Kelp PA-C Entered By: Lenda Kelp on 10/19/2019 14:51:16 Jessica [name] ([id number]) -------------------------------------------------------------------------------- Physician Orders Details [name]. Date of Service: 10/19/2019 10:00 AM Medical Record Number: [id number] Patient Account Number: [id number] Date of Birth/Sex: January 09, 1946 (74 y.o. F) Treating RN: Rodell Perna Primary Care Provider: Annita Brod Other Clinician: Referring Provider: Annita Brod Treating Provider/Extender: Linwood Dibbles, Edie Darley Weeks in Treatment: 125 Verbal / Phone Orders: No Diagnosis Coding ICD-10 Coding Code Description L89.154 Pressure ulcer of sacral region, stage 4 M48.00 Spinal stenosis, site unspecified R54 Age-related physical debility G89.4 Chronic pain syndrome E78.2 Mixed hyperlipidemia L89.93 Pressure ulcer of unspecified site, stage 3 Wound Cleansing Wound #1 Medial Sacrum o Other: - please cleanse sacral wound with dakins moistened gauze - do not spray dakins on wound Wound #7 Right,Midline Sacrum o Other: - please cleanse sacral wound with dakins moistened gauze - do not spray dakins on wound Skin Barriers/Peri-Wound Care Wound #1 Medial Sacrum o Skin Prep o Other: - AandD ointment  periwound Wound #7 Right,Midline Sacrum o Skin Prep o Other: - AandD ointment periwound Primary Wound Dressing Wound #1 Medial Sacrum o Alginate - with dry gauze behind alginate to fill space - may use barrier cream and alginate on wet peri-wound areas Wound #7 Right,Midline Sacrum o Alginate - with dry gauze behind alginate to fill space - may use barrier cream and alginate on wet peri-wound areas Secondary Dressing Wound #1 Medial Sacrum o ABD pad - secure with tape Wound #7 Right,Midline Sacrum o ABD pad - secure with tape Dressing Change Frequency Wound #1 Medial Sacrum o Change dressing twice daily. Wound #7 Right,Midline Sacrum o Change dressing twice daily. Follow-up Appointments Wound #1 Medial Sacrum o Return Appointment in 2 weeks. Wound #7 Right,Midline Sacrum o Return Appointment in 2 weeks. Jessica [name], Jessica [name] ([id number]) Off-Loading Wound #1 Medial Sacrum o Turn and reposition every 2 hours Wound #7 Right,Midline Sacrum o Turn and reposition every 2 hours Additional Orders / Instructions Wound #1 Medial Sacrum o Increase protein intake. Wound #7 Right,Midline Sacrum o Increase protein intake. Home Health Wound #1 Medial Sacrum o Continue Home Health Visits - [facility] o Home Health Nurse may visit PRN to address patientos wound care needs. o FACE TO FACE ENCOUNTER: MEDICARE and MEDICAID PATIENTS: I certify that this patient is under my care and that I had a face-to-face encounter that meets the physician face-to-face encounter requirements with this patient on this date. The encounter with the patient was in whole or in part for the following MEDICAL CONDITION: (primary reason for Home Healthcare) MEDICAL NECESSITY: I certify, that based on my findings, NURSING services are a medically necessary home health service. HOME BOUND STATUS: I certify that my clinical findings support that this patient is homebound (i.e., Due  to illness or injury, pt requires aid of supportive devices such as crutches, cane, wheelchairs, walkers, the use of special transportation or the assistance of another person to leave their place of residence. There is a normal inability to leave the home and doing so requires considerable and taxing effort. Other absences are for medical reasons / religious services and are infrequent or of short duration when for other reasons). o If current dressing causes regression in wound condition, may D/C ordered dressing product/s and apply Normal Saline Moist Dressing daily until next Wound Healing Center / Other MD appointment. Notify Wound Healing Center of regression in wound condition at  [phone]. o Please direct any NON-WOUND related issues/requests for orders to patient's Primary Care Physician Wound #7 Right,Midline Sacrum o Continue Home Health Visits - [facility] o Home Health Nurse may visit PRN to address patientos wound care needs. o FACE TO FACE ENCOUNTER: MEDICARE and MEDICAID PATIENTS: I certify that this patient is under my care and that I had a face-to-face encounter that meets the physician face-to-face encounter requirements with this patient on this date. The encounter with the patient was in whole or in part for the following MEDICAL CONDITION: (primary reason for Home Healthcare) MEDICAL NECESSITY: I certify, that based on my findings, NURSING services are a medically necessary home health service. HOME BOUND STATUS: I certify that my clinical findings support that this patient is homebound (i.e., Due to illness or injury, pt requires aid of supportive devices such as crutches, cane, wheelchairs, walkers, the use of special transportation or the assistance of another person to leave their place of residence. There is a normal inability to leave the home and doing so requires considerable and taxing effort. Other absences are for medical reasons / religious services  and are infrequent or of short duration when for other reasons). o If current dressing causes regression in wound condition, may D/C ordered dressing product/s and apply Normal Saline Moist Dressing daily until next Wound Healing Center / Other MD appointment. Notify Wound Healing Center of regression in wound condition at [phone]. o Please direct any NON-WOUND related issues/requests for orders to patient's Primary Care Physician Patient Medications Allergies: zinc, Ambien Notifications Medication Indication Start End triamcinolone acetonide 10/20/2019 DOSE 0 - topical 0.1 % cream - cream topical applied to irritated region no more than 2 weeks at a time as needed Electronic Signature(s) Signed: 10/20/2019 9:42:54 AM By: Lenda Kelp PA-C Previous Signature: 10/19/2019 3:29:06 PM Version By: Rodell Perna Previous Signature: 10/19/2019 4:55:37 PM Version By: Lenda Kelp PA-C Entered By: Lenda Kelp on 10/20/2019 09:42:48 Jessica [name] ([id number]) -------------------------------------------------------------------------------- Problem List Details [name]. Date of Service: 10/19/2019 10:00 AM Medical Record Number: [id number] Patient Account Number: [id number] Date of Birth/Sex: [birth year] (74 y.o. F) Treating RN: Huel Coventry Primary Care Provider: Annita Brod Other Clinician: Referring Provider: Annita Brod Treating Provider/Extender: Linwood Dibbles, Duron Meister Weeks in Treatment: 125 Active Problems ICD-10 Encounter Code Description Active Date MDM Diagnosis L89.154 Pressure ulcer of sacral region, stage 4 05/27/2017 No Yes M48.00 Spinal stenosis, site unspecified 05/27/2017 No Yes R54 Age-related physical debility 05/27/2017 No Yes G89.4 Chronic pain syndrome 05/27/2017 No Yes E78.2 Mixed hyperlipidemia 05/27/2017 No Yes L89.93 Pressure ulcer of unspecified site, stage 3 05/27/2017 No Yes Inactive Problems Resolved Problems Electronic  Signature(s) Signed: 10/19/2019 10:41:45 AM By: Lenda Kelp PA-C Entered By: Lenda Kelp on 10/19/2019 10:41:45 Jessica [name] ([id number]) -------------------------------------------------------------------------------- Progress Note Details [name]. Date of Service: 10/19/2019 10:00 AM Medical Record Number: [id number] Patient Account Number: [id number] Date of Birth/Sex: 1945-05-19 (74 y.o. F) Treating RN: Huel Coventry Primary Care Provider: Annita Brod Other Clinician: Referring Provider: Annita Brod Treating Provider/Extender: Linwood Dibbles, Elyas Villamor Weeks in Treatment: 125 Subjective Chief Complaint Information obtained from Patient she is here for evaluation of a sacral ulcer History of Present Illness (HPI) 05/27/17-she is here in initial evaluation for a left-sided sacral stage IV pressure ulcer and bilateral lower extremity, lateral aspect, unstageable pressure ulcers. She is accompanied by her husband and her son, who are her primary caregivers. She is bedbound secondary to spinal stenosis.  According to her son and husband she was hospitalized from 10/5-11/1 with healthcare associated pneumonia and altered mental status. During her hospitalization she was intubated, extubated on 10/27. She was discharged with Foley catheter and follow up with urology. According to her son and spouse she developed the sacral ulcer during hospitalization. Home health has been applying Santyl daily. She does have a low air loss mattress and is repositioned every 2-3 hours per her family's report. According to the son and spouse she had an appointment with urology on 12/18 and during that appointment developed discoloration to her bilateral lower extremities which ultimately developed into unstageable pressure ulcers to the lateral aspects of her bilateral lower extremities. There has been no topical treatment applied to these. She continues to have home health. There is  no concerns expressed regarding dietary intake, stating she eats 3 meals a day, eating was provided; she is supplemented with boost with protein. 06/03/17-she is here in follow-up evaluation for sacral and bilateral lower extremity ulcers. Plain film x-ray done today reveals no distraction to the sacrum or coccyx, no visible abnormalities. Home health has ordered the negative pressure wound system but it has not been initiated. We will continue with Hydrofera Blue until initiation of negative pressure wound system and continue with Santyl to bilateral lower extremity ulcers. Follow-up next week 06/10/17-she is here in follow-up evaluation for sacral and bilateral lower extremity ulcers. The wound VAC will be available tomorrow per home health. We will initiate wound VAC therapy to the sacral ulcer 3 times weekly (Thursday, Saturday/Sunday, Tuesday). We will continue with Santyl to the lower extremity ulcers. The patient's son is checking into home health therapy over the weekend for [facility] changes, with the understanding that if VAC changes cannot be performed over the weekend he will need to change his mother's appointments to Monday, Wednesday or Friday. The sacral ulcer clinically does not appear infected but there has been a change in the amount of drainage acutely, there is no significant amount of devitalized tissue, there is no malodor. Wound culture was obtained to evaluate for occult infection we will hold off on antibiotic therapy until sensitivities are resulted. 06/18/17 on evaluation today patient appears to be doing fairly well in my opinion although this is the first time I have seen this patient she has been previously evaluated by Tacey Ruiz here in our office. She is going to be switching to Fridays to see me due to the Wound VAC schedule being changed on Monday, Wednesday, and Friday. Subsequently she seems to be doing fairly well with the Wound VAC. Her son who was present during evaluation  today states that he somewhat stresses of the Wound VAC in making sure that it was functioning appropriately. With that being said everything seems to be working well he knows what settings on the [facility] itself to look at and ensure that it is functioning properly. Overall the wound appears to be nice and clean there is no need for debridement today. She has no discomfort in her bilateral lower extremity ulcers also appear to be improving based on measurements and what this honest tell me about the overall appearance. 07/02/17 on evaluation today I noted in patients wound bed that was actually an odor that had not previously been noted. Subsequently there was also a small area of bone that was not previously noted during my last evaluation. This did appear to be necrotic and was being somewhat forced out by the body around the region of granulation. She  does not have any pain which is good news. Nonetheless the overall appearance of the ulcer is making me concerned for the patient having developed osteomyelitis. Currently she is not been on any antibiotics and the Wound VAC has been doing fairly well in general. With that being said I do not think we need to continue the Wound VAC if she potentially has a bone infection. At least not until it is properly addressed with antibiotics. 07/09/17 on evaluation today patient appears to actually be doing very well in regard to her sacral ulcer compared to last week. There is actually no exposed bone at this point. 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 With that being said still I do believe she needs to have a referral to infectious disease due to the osteomyelitis I am gonna recommend that she continue with the doxycycline based on the culture results which showed Eikenella Corrodens as the organism  identified that tetracycline should work for. She does not seem to be having any discomfort whatsoever at this point. 07/16/17 on evaluation today patient actually appears to be doing rather well in regard to her sacral wound. I think that the original wound site actually appears much better than previously noted. With that being said she does have a new superficial injury on the right sacral region which appears to be due to according to the sign transport that occurred for her MRI unfortunately. Fortunately this is not too deep and I do think it can be managed but it was a new area that was not previously noted. Otherwise she has been tolerating the Dakin s soaked gauze packing without complication. 07/30/17 on evaluation today patient presents for reevaluation concerning her sacral wound. She has been tolerating the dressing changes without complication. With that being said her wound is doing so well at this point that I think she may be at the point where we could reinitiate the Wound VAC currently and hopefully see good results from this. I'm very pleased with how she has responded to the [facility] s soaked gauze packing. 08/13/17 evaluation today patient appears to be doing excellent in regard to her lower extremity ulcer this seems to be cleaning up very nicely. In regard to the sacral ulcer the area of trauma on the right lateral portion of the wound actually appears to be much better than previously noted during the last evaluation. The word has definitely filled in and the Wound VAC appears to be helpful I do believe. Overall I'm very pleased with how things seem to be progressing at this time. Patient likewise is also happy that things are doing well. Jessica [name], Jessica [name] ([id number]) 08/27/17 on evaluation today patient presents for follow-up concerning her ongoing issues with her sacral ulcer and lower extremity ulcer. Fortunately she has been tolerating the dressing changes well complication the  Wound VAC in general seems to have done very well up to this point. She does not have any evidence of infection which is good news. She does have some dark discoloration in central portion of the wound which is troublesome for the possibility of pressure injury to the site it sounds like her prior air mattress was not functioning properly her son has just bought a new one for her this is doing much better. Otherwise things seem to be progressing nicely. 09/10/17 on evaluation today patient  presents for follow-up concerning her sacral wound and lower extremity ulcer. She has been tolerating the switch of her dressing changes to the Dakin s soaked gauze dressing very well in regard to the sacrum. The left lateral lower extremity ulcer appears to have a new area open just distal to the one that we have been treating with Santyl and this is new since her last visit. There does not appear to be any evidence of infection which is good news. With that being said there's a lot of maceration here at the site and I feel this is likely due to the switch and the dressings it appears that due to the sticking of the dressings it will switch to utilizing a telfa pad over the Santyl. I think this caused a lot of drainage to be collected and situated in the region just under the dressing which has led to this causing which duration of breakdown. Overall there does not appear to be any significant pain which is good news. Of note I did actually have a conversation with the radiologist who is an interventional radiologist with [facility]. 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 With that being said there does not appear to be any rush to get this done according to the interventional radiologist whom I spoke with. Therefore after discussion it was determined that there gonna wait about six months prior to considering the procedure to get  time for hopefully the sacral wound to heal in the interim to at least some degree. 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 According to nursing staff these are all improved, the negative pressure wound therapy system was put on hold last week. We will resume negative pressure wound therapy today, continue with Santyl to the lower extremity and she will follow-up next week. 09/24/17 on evaluation today unfortunately the patient's wound appears to be doing significantly worse compared to last week's evaluation and even my valuation the week before. She actually has bone noted on the left wound margin in the region of undermining unfortunately. This was not noted during the last evaluation. I am concerned about the fact that this seems to be worse not better since our last visit with her. My biggest concern is that she's likely developing a worsening osteomyelitis. This was discussed with patient and her son today during the office visit. 05/03/17 on evaluation today patient actually appears to be doing rather well in regard to her sacral ulcer compared to last week's evaluation. She has been tolerating the dressing changes without complication. Fortunately even though we're not utilizing the Wound VAC she seems to be making some strides in seeing the wound area overall improved quite dramatically in my pinion in just one weeks time. She also seems to be staying off of this to the point that I do not see any evidence of new injury which is excellent news. Whatever she's been doing in that regard over the past week I would want her to continue. I did also review the results of her bone culture which revealed group B strep as the responsible organism. Again this is what's causing her osteomyelitis she does have infectious disease appointment scheduled for 10/11/17 10/08/1898 valuation today patient appears to be doing better for the most part in regard to the  sacral wound. Overall she is showing signs of having granulation of the bone which is  good news. There is an area towards the left of the wound where she does seem to be showing some signs of opening this it would be due to additional pressure to the area unfortunately. There does not appear to be any evidence of infection spreading which is good news that do not see any evidence of significant purulent discharge which is also good news. 10/26/17 on evaluation today patient sacral ulcer actually appears to be very healthy and doing well in regard to the overall appearance of the wound. With that being said she does seem to be having some issues at this point in time with some shear/friction injury of the sacrum from where she was transported to [facility] for her infectious disease appointment. She has been placed on IV antibiotic therapy I will have to go back and find that note for review as I did not have access to that today. Nonetheless I will add the next visit be sure to document the exact antibiotics that she is taking at this point. Nonetheless I do believe the wound is doing better there's no bone exposure nothing that requires debridement in regard to the sacral wound. Likewise her left lateral lower extremity ulcer also appears to be doing significantly better. In general I feel like things are showing signs of improvement all around. 11/12/17 on evaluation today patient appears to be doing rather well in regard to her sacral wound. In general I do see signs of granulation at this point. Fortunately there does not appear to be any evidence of infection which is good news as well. In general overall happy with the appearance. With that being said I'll be see I would like for this to be progressing more rapidly as with the patient and her son but nonetheless at least things do not appear to be doing worse. Her lower extremity ulcer is doing significantly better. 11/26/17 on evaluation today  patient appears to be doing a little better in regard to the sacral wound especially in the peripheral of the wound bed. She actually did have an abrasion on the right gluteal region that's completely resolved to this point. Her lower extremity also appears to be for the most part completely resolved. Overall the sacrum itself seems to be the one thing that is still open and given her trouble. No fevers, chills, nausea, or vomiting noted at this time. She actually has an appointment next week with infectious disease for recheck to see how things are doing. She maybe switch to oral medications at that point. 12/09/17 on evaluation today patient actually appears to be doing much better in regard to her sacral wound. I do feel like she has made good progress at this time as far as healing is concerned. In fact the wound looks better to me today than it has in quite some time. She is on oral antibiotic therapy and no longer on the IV antibiotics. She did see infectious disease last week. 12/24/17 on evaluation today patient's wounds actually appear to be doing decently well in regard to the sacral wound in particular. When it comes to her lower extremity ulcers both appear to be completely healed at this point in the one area where she had lived the rash has completely resolved at this time. Overall very pleased with the progress that has been made. Nonetheless the patient seems to be doing well in general. She still does have quite a bit of healing to do in regard to the sacral wound but I feel  like she is making progress. 01/07/18 on evaluation today patient actually appears to be doing excellent today regard to her sacral wound. The granular quality in the base of the wound is excellent and shown signs of good improvement there's no evidence of contusion nor deep tissue injury and the periwound actually appears to be doing well. In general I'm very pleased with the overall appearance. 02/04/18 on  evaluation today patient's wound actually appears to be doing decently well as far as the granulation is concerned. She has been tolerating the dressing changes without complication. Right now we are still using the Wound VAC. Nonetheless overall there apparently is some cater to the wound and this is concerning simply for the fact that she could be developing a soft tissue infection again. She's had this happen in the past and at that time responded very well to the antibiotics. Nonetheless I don't think I would likely put her on anything prophylactically but rather wait for the results of the culture to return. 02/18/18 on evaluation today patient actually appears to be doing fairly well. She has been tolerating the dressing changes. And the Wound VAC seems to be doing well. Overall I'm very pleased with how things have improved over the past couple weeks since I last saw her. Fortunately there does not appear to be any evidence of overt infection at this time which is good news. She has been taking the antibiotics without Jessica [name], Jessica [name]. ([id number]) complication. Specifically that is the Bactrim DS which was prescribed on 02/08/18. 03/18/18 on evaluation today patient's wound actually does have quite a bit of odor noted compared to previous. With that being said the wound itself overall does not appear to be too significant or worse. There is no event noted in the wound bed which is good news. With that being said the patient has been worried as the home health nurse stated that she thought she saw bone in the base of the wound. Nonetheless there's a little bit of bruising along the left lateral border of the wound bed which again does not appear to be terrible we have noticed this before but other than this the majority the wound bed appears to be doing excellent in my pinion. I am wondering if there may be some bacterial colonization. This could be leading to some of the odor that we are noting  with the Wound VAC. 04/01/18 on evaluation today patient appears to actually be doing well in regard to the overall appearance of the wound itself. She also does not have any odor at this point in regard to the wound surface and I do believe the Bacons is extremely helpful in this regard. With that being said she does have a little bit of deep tissue injury on the medial aspect of the wound. Again last time it was more lateral. The lateral seems to have improved quite well. Nonetheless she also notes by way of her son that this has been very macerated in the past several weeks since I last saw her. Though things overall seem to be doing better I still think that the Wound VAC may help with managing her fluid better I'm wondering if we can attempt a gauze Wound VAC my hope is that this will be able to manage the moisture control as well as continuing with managing the odor of the wound and bacterial load since the Dakin s has done excellent in helping in that regard. 04/15/17 on evaluation today patient actually appears to  be doing much better in regard to the overall appearance of her sacral wound. I do believe that the gauze Vac has been of benefit for her. Overall she is tolerating this without complication which is also good news. There is no sign of injury to the sacral region at this point. 04/29/18 on evaluation today patient appears to be doing well in regard to her sacral ulcer. She sure signs of good granulation and the Gauze VAC is doing very well for her. Overall I'm pleased in this regard. The biggest issue is that the time from Friday till Monday seems to be quite long for her as far as the amount of time to keep the Wound VAC sealed. For that reason her son wonders if he can take this off on Sunday morning in order to give her day of just wet to dry dressing's with the Dakin's solution I think this would be okay. 05/13/18 on evaluation today patient appears to be doing okay in regard to her  sacral ulcer. There does not appear to be any evidence of overt infection at this time which is good news. In general the drainage and moisture appears to be significantly improved this was after the pressure on the Wound VAC was increased after they contacted our office. With that being said a lot of the dark tissue around the edge of the wound has improved in the maceration is significantly better this seems to be managing much better. Fortunately there is no signs of infection at this time. Overall the patient is doing quite well. The wound measurements are not significantly smaller today although they were over the past two visits we will see how things go. 05/27/18 on evaluation today patient's sacral wound actually appears to be doing fairly well at this point. She does have a deep tissue injury yet again on the medial aspect of the wound which is something that we seem to be seeing often. There is some question as to whether or not this is actually being caused by the Wound VAC bunching up and then her somewhat laying on it even though they are attempting to offload this as best as possible. Patient son states that overall they do the best they can trying offload her and I definitely believe him. I may want to see about putting the Wound VAC on hold for a couple weeks and see if this happens just with standard dressing changes. 06/10/18 on evaluation today patient actually appears to be doing better in regard to her sacral wound. Overall there's no signs of infection, no odor, and no significant skin breakdown around the edge of the wound I feel like this is done much better 50 Dakin s soaked gauze dressing as opposed to legalizing anything such as the Wound VAC. I believe that we discontinue the Wound VAC as of today. 06/24/18 on evaluation today patient actually appears to be doing well in regard to her sacral ulcer. There's no signs of infection, no odor, and in my pinion no concerns at all. I  feel like she is making good progress as far as I'm concerned. 07/12/18 on evaluation today patient appears to be doing very well in regard to her sacral ulcer. She has been tolerating the dressing changes without complication. Fortunately there does not appear to be any evidence of active infection at this time she is doing excellent as far as I'm concerned at this point. 08/11/18 on evaluation today patient's sacral wound actually appears to be doing quite  well at this point. The main issue I see is that she continues to have some maceration and skin breakdown due to moisture over the right periwound location unfortunately. We've investigated the possibility of using zinc oxide before but have not utilized it simply due to the fact that she continues to state. Her son anyways that she is allergic to this. They have not tried it recently and may be completely unrelated to the reaction she had before. Nonetheless this may be something you want to attempt will discuss that in the plan. 10/04/18 on evaluation today patient actually appears to be doing quite well in regard to her sacral wound. It's been roughly 2 months since I last seen her in that time her wound has definitely made progress. This is very slow but again nonetheless does seem to be trending in the correct direction. Overall very pleased in this regard. No fevers, chills, nausea, or vomiting noted at this time. 11/07/2018 on evaluation today patient appears to be doing decently well with regard to her sacral wound based on what I am seeing at this point. She is seen by way of a telehealth visit due to the COVID-19 national emergency and to be honest the patient is somewhat reluctant to come out to be seen for visits which I completely understand. They feel much safer keeping her home as much as possible due to her adequate condition in general. The only issue that she has had recently she did have some wheezing and subsequently chest  congestion she was just placed on albuterol along with prednisone at this point. She seems to be doing much better even after 24 hours of the prednisone according to her [name]. Overall they are pleased with how things seem to be progressing. 11/28/2018 upon evaluation today patient is seen by way of telehealth visit since she is still somewhat reluctant to come out due to the COVID-19 pandemic. Subsequently she seems to be doing well and infected I could not obtain actual measurements for the wound the overall appearance of the wound is dramatically improved. She does not appear to have nearly as much space at the 12:00 location and in fact her [name] who is taking care of her mainly states that he is barely able to get anything up into that region is filled and so nicely. Overall I feel like she is making great progress. 12/12/2018 patient is seen today via a telehealth visit again secondary to her concerns about coming out during the COVID-19 national pandemic. Nonetheless she appears to be doing quite well at this time which is good news. There is no signs of active infection at this time. Overall patient seems to be very pleased as is her son who takes very good care of her. 01/16/2019 on evaluation today patient actually appears to be doing very well with regard to her wound. We have been doing zoom calls quite a bit since the COVID-19 pandemic began just due to the fact that she has been somewhat concerned about coming out. With that being said she seems to have done very well and in fact the wound is measuring quite a bit smaller today compared to the last measurement. Fortunately there Jessica [name], Jessica [name]. ([id number]) is no signs of active infection at this time. She is also having great improvement overall in her demeanor she is very upbeat and pleased with how things have progressed. 02/21/2019 upon evaluation today patient actually appears to be doing very well with regard to her  wound. She has been tolerating the dressing changes her sons been performing these without complication and seems to do an excellent job in my opinion. I am actually very pleased with the overall appearance. The wound is significantly smaller compared to what its been in the past by quite a bit. 03/21/19 on evaluation today patient appears to be doing excellent in regard to her wound. She's been tolerating the dressing changes without complication and there does not appear to be evidence of active infection which is good news. She is still tolerating the Dakin s Moistened gauze packing without complication. 04/18/19 on evaluation today patient is seen by way of a telehealth visit due to her concerns of being exposed to Covid-19 Virus if she comes into the clinic. Subsequently her son is present to help with the visit today. They have been performing the dressing changes specifically Dakins soloution at this point in order to help with granulation in the base of the wound. Fortunately there is no evidence of active infection currently. Her son does note that he feels like maybe some bone in the base of the wound centrally that he is somewhat concerned about. With that being said it's not entirely visible but he states that he can palpate it. 05/25/2019 upon evaluation today patient appears to be doing very well in fact in regard to her wounds. I am very pleased with how things seem to be progressing and though she is not showing any signs of infection I do believe that it may be time to switch the dressing to something a little different I think she has a lot of irritation in the periwound and this is likely coming some from the fact that she does have a lot of moisture. I think the wound is small enough we can see about initiation of a silver alginate dressing. I think this will help keep the periwound from breaking down. Overall however I am very pleased with how things stand. 06/15/2019 upon evaluation  today patient appears to be doing very well with regard to her wound. Overall the periwound also seems to be doing much better. She does have several filament-like projections of her skin on the periwound region that been present for some time unfortunately they keep getting pulled and stuck to the dressing causing this area to bleed. I think we are getting need to shave/trim these down today to help her to more fully heal in this periwound location so this would not continue to be an issue for her. 07/06/2019 upon evaluation today patient actually appears to be doing excellent in regard to her wound. She is showing signs of improvement the wound is measuring smaller and is looking very well. In general I am extremely happy. The areas that we trimmed off and periwound where she had skin tags last week actually seem to have done excellent they never even bled again. 08/15/2019 upon evaluation today patient appears to actually be doing better with regard to her actual wound. Unfortunately the periwound does not appear to be doing quite as well at this time unfortunately. She had pneumonia over the past couple weeks that is why she did not make it into her appointment at the last visit. Subsequently she because of having pneumonia was not able to lay flat or even on her side as she could not breathe well therefore she spent a lot more time sitting up even sleeping sitting up as opposed to laying on her side. I think this is made some  trouble for her as far as the periwound of her sacral wound is concerned and that is why she is showing signs of the irritation of the skin around. Nonetheless I think this is something that can be managed at this point she now feels better than pneumonia has cleared and overall I believe that she is a much better place in that regard to be able to offload. 10/19/2019 upon evaluation today patient appears to be doing well with regard to her wound overall although the periwound  unfortunately does have some breakdown today that has been somewhat concerned. Mainly I am concerned about the fact that again previously this was in very good shape now it appears to be very dry and not well protected despite the use of the same. We may want to try something different such as a AandE ointment or something of like to see if that can be beneficial for the patient. I really feel like the alginate is a good option although I think we need to make sure this is being packed into the wound bed apparently that was not the way the patient's son was doing this previously which I thought he had been to be honest. Nonetheless we will address that today and make appropriate recommendations. Objective Constitutional Obese and well-hydrated in no acute distress. Vitals Time Taken: 10:24 AM, Height: 70 in, Temperature: 99.3 F, Pulse: 105 bpm, Respiratory Rate: 18 breaths/min, Blood Pressure: 144/73 mmHg. Respiratory normal breathing without difficulty. Psychiatric this patient is able to make decisions and demonstrates good insight into disease process. Alert and Oriented x 3. pleasant and cooperative. General Notes: Upon inspection patient's wound bed actually showed signs of good granulation at this time which is good news. Unfortunately the periwound is a little bit more macerated and broken down at this time. I do believe that the silver alginate is probably still superior to the Dakin's moistened gauze but I do believe we need to pack this into the wound bed to prevent anything from leaking or draining and causing significant issues here. Integumentary (Hair, Skin) Wound #1 status is Open. Original cause of wound was Pressure Injury. The wound is located on the Medial Sacrum. The wound measures 7cm length x 4.5cm width x 1.8cm depth; 24.74cm^2 area and 44.532cm^3 volume. There is muscle and Fat Layer (Subcutaneous Tissue) Exposed Large, [name] ([id number]) exposed. There is  undermining starting at 8:00 and ending at 12:00 with a maximum distance of 3.4cm. There is a large amount of serosanguineous drainage noted. The wound margin is distinct with the outline attached to the wound base. There is medium (34-66%) red, pink granulation within the wound bed. There is a medium (34-66%) amount of necrotic tissue within the wound bed including Adherent Slough. Wound #7 status is Open. Original cause of wound was Pressure Injury. The wound is located on the Right,Midline Sacrum. The wound measures 4cm length x 2.5cm width x 0.2cm depth; 7.854cm^2 area and 1.571cm^3 volume. There is Fat Layer (Subcutaneous Tissue) Exposed exposed. There is no tunneling or undermining noted. There is a medium amount of serous drainage noted. The wound margin is flat and intact. There is medium (34-66%) red granulation within the wound bed. There is a medium (34-66%) amount of necrotic tissue within the wound bed including Adherent Slough. Assessment Active Problems ICD-10 Pressure ulcer of sacral region, stage 4 Spinal stenosis, site unspecified Age-related physical debility Chronic pain syndrome Mixed hyperlipidemia Pressure ulcer of unspecified site, stage 3 Plan Wound Cleansing: Wound #1 Medial  Sacrum: Other: - please cleanse sacral wound with dakins moistened gauze - do not spray dakins on wound Wound #7 Right,Midline Sacrum: Other: - please cleanse sacral wound with dakins moistened gauze - do not spray dakins on wound Skin Barriers/Peri-Wound Care: Wound #1 Medial Sacrum: Skin Prep Other: - AandD ointment periwound Wound #7 Right,Midline Sacrum: Skin Prep Other: - AandD ointment periwound Primary Wound Dressing: Wound #1 Medial Sacrum: Alginate - with dry gauze behind alginate to fill space - may use barrier cream and alginate on wet peri-wound areas Wound #7 Right,Midline Sacrum: Alginate - with dry gauze behind alginate to fill space - may use barrier cream and alginate  on wet peri-wound areas Secondary Dressing: Wound #1 Medial Sacrum: ABD pad - secure with tape Wound #7 Right,Midline Sacrum: ABD pad - secure with tape Dressing Change Frequency: Wound #1 Medial Sacrum: Change dressing twice daily. Wound #7 Right,Midline Sacrum: Change dressing twice daily. Follow-up Appointments: Wound #1 Medial Sacrum: Return Appointment in 2 weeks. Wound #7 Right,Midline Sacrum: Return Appointment in 2 weeks. Off-Loading: Wound #1 Medial Sacrum: Turn and reposition every 2 hours Wound #7 Right,Midline Sacrum: Turn and reposition every 2 hours Additional Orders / Instructions: Wound #1 Medial Sacrum: Increase protein intake. Wound #7 Right,Midline Sacrum: Increase protein intake. Home Health: Wound #1 Medial Sacrum: [facility] Visits - [facility] Jessica [name], Jessica [name] ([id number]) Home Health Nurse may visit PRN to address patient s wound care needs. FACE TO FACE ENCOUNTER: MEDICARE and MEDICAID PATIENTS: I certify that this patient is under my care and that I had a face-to-face encounter that meets the physician face-to-face encounter requirements with this patient on this date. The encounter with the patient was in whole or in part for the following MEDICAL CONDITION: (primary reason for Home Healthcare) MEDICAL NECESSITY: I certify, that based on my findings, NURSING services are a medically necessary home health service. HOME BOUND STATUS: I certify that my clinical findings support that this patient is homebound (i.e., Due to illness or injury, pt requires aid of supportive devices such as crutches, cane, wheelchairs, walkers, the use of special transportation or the assistance of another person to leave their place of residence. There is a normal inability to leave the home and doing so requires considerable and taxing effort. Other absences are for medical reasons / religious services and are infrequent or of short duration when for other  reasons). If current dressing causes regression in wound condition, may D/C ordered dressing product/s and apply Normal Saline Moist Dressing daily until next Wound Healing Center / Other MD appointment. Notify Wound Healing Center of regression in wound condition at [phone]. Please direct any NON-WOUND related issues/requests for orders to patient's Primary Care Physician Wound #7 Right,Midline Sacrum: Continue Home Health Visits - [facility] Nurse may visit PRN to address patient s wound care needs. FACE TO FACE ENCOUNTER: MEDICARE and MEDICAID PATIENTS: I certify that this patient is under my care and that I had a face-to-face encounter that meets the physician face-to-face encounter requirements with this patient on this date. The encounter with the patient was in whole or in part for the following MEDICAL CONDITION: (primary reason for Home Healthcare) MEDICAL NECESSITY: I certify, that based on my findings, NURSING services are a medically necessary home health service. HOME BOUND STATUS: I certify that my clinical findings support that this patient is homebound (i.e., Due to illness or injury, pt requires aid of supportive devices such as crutches, cane, wheelchairs, walkers, the use of special  transportation or the assistance of another person to leave their place of residence. There is a normal inability to leave the home and doing so requires considerable and taxing effort. Other absences are for medical reasons / religious services and are infrequent or of short duration when for other reasons). If current dressing causes regression in wound condition, may D/C ordered dressing product/s and apply Normal Saline Moist Dressing daily until next Wound Healing Center / Other MD appointment. Notify Wound Healing Center of regression in wound condition at [phone]. Please direct any NON-WOUND related issues/requests for orders to patient's Primary Care Physician The following  medication(s) was prescribed: triamcinolone acetonide topical 0.1 % cream 0 cream topical applied to irritated region no more than 2 weeks at a time as needed starting 10/20/2019 1. I would recommend that we continue with the silver alginate dressing though this does need to be lightly packed into the wound bed and then dry gauze over top to keep in contact with the wound bed. 2. With regard to the periwound we will apply AandE ointment and then subsequently alginate dressings to the open areas at these locations as I feel like that would also be beneficial. 3. I am also going to suggest continued and appropriate offloading though this is something I think they do a good job of taking care of at home. We will see patient back for reevaluation in 3 weeks here in the clinic. If anything worsens or changes patient will contact our office for additional recommendations. Electronic Signature(s) Signed: 10/20/2019 9:43:13 AM By: Lenda Kelp PA-C Previous Signature: 10/19/2019 2:52:46 PM Version By: Lenda Kelp PA-C Entered By: Lenda Kelp on 10/20/2019 09:43:12 Jessica [name] ([id number]) -------------------------------------------------------------------------------- SuperBill Details [name]. Date of Service: 10/19/2019 Medical Record Number: [id number] Patient Account Number: [id number] Date of Birth/Sex: [birth year] (74 y.o. 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 sacral region, stage 4 M48.00 Spinal stenosis, site unspecified R54 Age-related physical debility G89.4 Chronic pain syndrome E78.2 Mixed hyperlipidemia L89.93 Pressure ulcer of unspecified site, stage 3 Facility Procedures CPT4 Code: 72536644 Description: 99213 - WOUND CARE  VISIT-LEV 3 EST PT Modifier: Quantity: 1 Physician Procedures CPT4 Code: 0347425 Description: 99214 - WC PHYS LEVEL 4 - EST PT Modifier: Quantity: 1 CPT4 Code: Description: ICD-10 Diagnosis Description L89.154 Pressure ulcer of sacral region, stage 4 M48.00 Spinal stenosis, site unspecified R54 Age-related physical debility G89.4 Chronic pain syndrome Modifier: Quantity: Electronic Signature(s) Signed: 10/19/2019 2:53:17 PM By: Lenda Kelp PA-C Entered By: Lenda Kelp on 10/19/2019 14:53:12

## 2019-10-20 NOTE — Progress Notes (Signed)
Jessica [name], Jessica [name] ([id number]) Visit Report for 10/19/2019 Arrival Information Details [name], Jessica [name]. Date of Service: 10/19/2019 10:00 AM Medical Record Number: [id number] Patient Account Number: [id number] Date of Birth/Sex: 01-Sep-1945 (74 y.o. F) Treating [name]: Jessica [name] Primary Care Jessica [name]: Jessica [name] Other Clinician: Referring Jessica [name]: Jessica [name] Treating Jessica [name]/Extender: Jessica [name], Jessica [name] in Treatment: 125 Visit Information History Since Last Visit Added or deleted any medications: Yes Patient Arrived: Stretcher Has Dressing in Place as Prescribed: Yes Arrival Time: 10:22 Pain Present Now: No Accompanied By: son Transfer Assistance: Stretcher Patient Identification Verified: Yes Secondary Verification Process Completed: Yes Patient Has Alerts: Yes Patient Alerts: ALLERGIC TO ZINC Electronic Signature(s) Signed: 10/19/2019 5:43:28 PM By: Jessica [name], BSN, [name], CWS, Jessica [name], BSN Entered By: Jessica [name], BSN, [name], CWS, Jessica on 10/19/2019 10:22:50 Jessica [name] ([id number]) -------------------------------------------------------------------------------- Clinic Level of Care Assessment Details [name]. Date of Service: 10/19/2019 10:00 AM Medical Record Number: [id number] Patient Account Number: [id number] Date of Birth/Sex: February 19, 1946 (74 y.o. F) Treating [name]: Jessica Perna Primary Care Araeya Lamb: Jessica [name] Other Clinician: Referring Sriram Febles: Jessica [name] Treating Jamielynn Wigley/Extender: Jessica [name], Jessica [name] in Treatment: 125 Clinic Level of Care Assessment Items TOOL 4 Quantity Score []  - Use when only an EandM is performed on FOLLOW-UP visit 0 ASSESSMENTS - Nursing Assessment / Reassessment X - Reassessment of Co-morbidities (includes updates in patient status) 1 10 X- 1 5 Reassessment of Adherence to Treatment Plan ASSESSMENTS - Wound and Skin Assessment / Reassessment X - Simple Wound Assessment / Reassessment - one wound 1  5 X- 2 5 Complex Wound Assessment / Reassessment - multiple wounds []  - 0 Dermatologic / Skin Assessment (not related to wound area) ASSESSMENTS - Focused Assessment []  - Circumferential Edema Measurements - multi extremities 0 []  - 0 Nutritional Assessment / Counseling / Intervention []  - 0 Lower Extremity Assessment (monofilament, tuning fork, pulses) []  - 0 Peripheral Arterial Disease Assessment (using hand held doppler) ASSESSMENTS - Ostomy and/or Continence Assessment and Care []  - Incontinence Assessment and Management 0 []  - 0 Ostomy Care Assessment and Management (repouching, etc.) PROCESS - Coordination of Care X - Simple Patient / Family Education for ongoing care 1 15 []  - 0 Complex (extensive) Patient / Family Education for ongoing care []  - 0 Staff obtains , Records, Test Results / Process Orders []  - 0 Staff telephones HHA, Nursing Homes / Clarify orders / etc []  - 0 Routine Transfer to another Facility (non-emergent condition) []  - 0 Routine Hospital Admission (non-emergent condition) []  - 0 New Admissions / / Ordering NPWT, Apligraf, etc. []  - 0 Emergency Hospital Admission (emergent condition) X- 1 10 Simple Discharge Coordination []  - 0 Complex (extensive) Discharge Coordination PROCESS - Special Needs []  - Pediatric / Minor Patient Management 0 []  - 0 Isolation Patient Management []  - 0 Hearing / Language / Visual special needs []  - 0 Assessment of Community assistance (transportation, D/C planning, etc.) []  - 0 Additional assistance / Altered mentation []  - 0 Support Surface(s) Assessment (bed, cushion, seat, etc.) INTERVENTIONS - Wound Cleansing / Measurement [name], [name] ( ) []  - 0 Simple Wound Cleansing - one wound X- 2 5 Complex Wound Cleansing - multiple wounds X- 1 5 Wound Imaging (photographs - any number of wounds) []  - 0 Wound Tracing (instead of photographs) []  - 0 Simple Wound  Measurement - one wound X- 2 5 Complex Wound Measurement - multiple wounds INTERVENTIONS - Wound Dressings []  - Small Wound Dressing one  or multiple wounds 0 X- 2 15 Medium Wound Dressing one or multiple wounds []  - 0 Large Wound Dressing one or multiple wounds []  - 0 Application of Medications - topical []  - 0 Application of Medications - injection INTERVENTIONS - Miscellaneous []  - External ear exam 0 []  - 0 Specimen Collection (cultures, biopsies, blood, body fluids, etc.) []  - 0 Specimen(s) / Culture(s) sent or taken to Lab for analysis []  - 0 Patient Transfer (multiple staff / Nurse, adultHoyer Lift / Similar devices) []  - 0 Simple Staple / Suture removal (25 or less) []  - 0 Complex Staple / Suture removal (26 or more) []  - 0 Hypo / Hyperglycemic Management (close monitor of Blood Glucose) []  - 0 Ankle / Brachial Index (ABI) - do not check if billed separately X- 1 5 Vital Signs Has the patient been seen at the hospital within the last three years: Yes Total Score: 115 Level Of Care: New/Established - Level 3 Electronic Signature(s) Signed: 10/19/2019 3:29:06 PM By: Jessica [name], Jessica [name] Entered By: Jessica [name], Jessica [name] on 10/19/2019 10:48:37 Jessica [name], [name] ([id number]) -------------------------------------------------------------------------------- Encounter Discharge Information Details [name], [name] Date of Service: 10/19/2019 10:00 AM Medical Record Number: [id number] Patient Account Number: [id number] Date of Birth/Sex: 1946/04/04 (74 y.o. F) Treating [name]: Jessica [name], Jessica [name] Primary Care Estefania Kamiya: Jessica BrodASENSO, PHILIP Other Clinician: Referring Obadiah Dennard: Jessica BrodASENSO, PHILIP Treating Analina Filla/Extender: Jessica DibblesSTONE III, Jessica [name] in Treatment: 125 Encounter Discharge Information Items Discharge Condition: Stable Ambulatory Status: Stretcher Discharge Destination: Home Transportation: Ambulance Accompanied By: son Schedule Follow-up Appointment: Yes Clinical Summary of Care: Electronic  Signature(s) Signed: 10/19/2019 3:29:06 PM By: Jessica [name], Jessica [name] Entered By: Jessica [name], Jessica [name] on 10/19/2019 10:49:30 Jessica [name], [name] ([id number]) -------------------------------------------------------------------------------- Lower Extremity Assessment Details [name], [name] Date of Service: 10/19/2019 10:00 AM Medical Record Number: [id number] Patient Account Number: [id number] Date of Birth/Sex: 1946/04/04 (74 y.o. F) Treating [name]: Jessica CoventryWoody, Jessica Primary Care Jobany Montellano: Jessica BrodASENSO, PHILIP Other Clinician: Referring Jarian Longoria: Jessica BrodASENSO, PHILIP Treating Lilibeth Opie/Extender: Lenda KelpSTONE III, Jessica [name] in Treatment: 125 Electronic Signature(s) Signed: 10/19/2019 5:43:28 PM By: Jessica GurneyWoody, BSN, [name], CWS, Jessica [name], BSN Entered By: Jessica GurneyWoody, BSN, [name], CWS, Jessica on 10/19/2019 10:38:40 Jessica [name], [name] ([id number]) -------------------------------------------------------------------------------- Multi Wound Chart Details [name], [name] Date of Service: 10/19/2019 10:00 AM Medical Record Number: [id number] Patient Account Number: [id number] Date of Birth/Sex: 1946/04/04 (74 y.o. F) Treating [name]: Jessica [name], Jessica [name] Primary Care Deanie Jupiter: Jessica BrodASENSO, PHILIP Other Clinician: Referring Ashdon Gillson: Jessica BrodASENSO, PHILIP Treating Zelene Barga/Extender: STONE III, Jessica [name] in Treatment: 125 Vital Signs Height(in): 70 Pulse(bpm): 105 Weight(lbs): Blood Pressure(mmHg): 144/73 Body Mass Index(BMI): Temperature(F): 99.3 Respiratory Rate(breaths/min): 18 Photos: [N/A:N/A] Wound Location: Medial Sacrum Right, Midline Sacrum N/A Wounding Event: Pressure Injury Pressure Injury N/A Primary Etiology: Pressure Ulcer Pressure Ulcer N/A Date Acquired: 01/15/2017 10/12/2019 N/A [name] of Treatment: 125 0 N/A Wound Status: Open Open N/A Measurements L x W x D (cm) 7x4.5x1.8 4x2.5x0.2 N/A Area (cm) : 24.74 7.854 N/A Volume (cm) : 44.532 1.571 N/A % Reduction in Area: 32.90% N/A N/A % Reduction in Volume: -20.80% N/A N/A Starting  Position 1 (o'clock): 8 Ending Position 1 (o'clock): 12 Maximum Distance 1 (cm): 3.4 Undermining: Yes No N/A Classification: Category/Stage IV Category/Stage II N/A Exudate Amount: Large Medium N/A Exudate Type: Serosanguineous Serous N/A Exudate Color: red, brown amber N/A Wound Margin: Distinct, outline attached Flat and Intact N/A Granulation Amount: Medium (34-66%) Medium (34-66%) N/A Granulation Quality: Red, Pink Red N/A Necrotic Amount: Medium (34-66%) Medium (34-66%) N/A Exposed Structures: Fat Layer (Subcutaneous Tissue) Fat Layer (Subcutaneous Tissue) N/A  Exposed: Yes Exposed: Yes Muscle: Yes Fascia: No Fascia: No Tendon: No Tendon: No Muscle: No Joint: No Joint: No Bone: No Bone: No Epithelialization: Small (1-33%) None N/A Treatment Notes Electronic Signature(s) Signed: 10/19/2019 3:29:06 PM By: Jessica Perna Entered By: Jessica Perna on 10/19/2019 10:43:57 Jessica [name] ([id number]) -------------------------------------------------------------------------------- Multi-Disciplinary Care Plan Details [name]. Date of Service: 10/19/2019 10:00 AM Medical Record Number: [id number] Patient Account Number: [id number] Date of Birth/Sex: Jun 22, 1945 (74 y.o. F) Treating [name]: Jessica Perna Primary Care Malaijah Houchen: Jessica [name] Other Clinician: Referring Ido Wollman: Jessica [name] Treating Rolonda Pontarelli/Extender: Jessica [name], Jessica [name] in Treatment: 125 Active Inactive Pressure Nursing Diagnoses: Knowledge deficit related to causes and risk factors for pressure ulcer development Knowledge deficit related to management of pressures ulcers Potential for impaired tissue integrity related to pressure, friction, moisture, and shear Goals: Patient will remain free from development of additional pressure ulcers Date Initiated: 06/03/2017 Target Resolution Date: 06/17/2017 Goal Status: Active Patient will remain free of pressure ulcers Date Initiated:  06/03/2017 Target Resolution Date: 06/17/2017 Goal Status: Active Patient/caregiver will verbalize risk factors for pressure ulcer development Date Initiated: 06/03/2017 Target Resolution Date: 06/17/2017 Goal Status: Active Interventions: Assess: immobility, friction, shearing, incontinence upon admission and as needed Assess potential for pressure ulcer upon admission and as needed Notes: Wound/Skin Impairment Nursing Diagnoses: Impaired tissue integrity Goals: Patient/caregiver will verbalize understanding of skin care regimen Date Initiated: 06/03/2017 Target Resolution Date: 06/17/2017 Goal Status: Active Ulcer/skin breakdown will have a volume reduction of 30% by week 4 Date Initiated: 06/03/2017 Target Resolution Date: 06/17/2017 Goal Status: Active Interventions: Assess ulceration(s) every visit Treatment Activities: Patient referred to home care : 06/03/2017 Skin care regimen initiated : 06/03/2017 Notes: Electronic Signature(s) Signed: 10/19/2019 3:29:06 PM By: Jessica Perna Entered By: Jessica Perna on 10/19/2019 10:43:48 Jessica [name] ([id number]) -------------------------------------------------------------------------------- Pain Assessment Details [name]. Date of Service: 10/19/2019 10:00 AM Medical Record Number: [id number] Patient Account Number: [id number] Date of Birth/Sex: 1945-09-07 (74 y.o. F) Treating [name]: Jessica [name] Primary Care Telvin Reinders: Jessica [name] Other Clinician: Referring Tondra Reierson: Jessica [name] Treating Terin Cragle/Extender: Jessica [name], Jessica [name] in Treatment: 125 Active Problems Location of Pain Severity and Description of Pain Patient Has Paino No Site Locations Pain Management and Medication Current Pain Management: Notes Patient denies pain at this time. Electronic Signature(s) Signed: 10/19/2019 5:43:28 PM By: Jessica [name], BSN, [name], CWS, Jessica [name], BSN Entered By: Jessica [name], BSN, [name], CWS, Jessica on 10/19/2019 10:26:34 Jessica [name]  ([id number]) -------------------------------------------------------------------------------- Patient/Caregiver Education Details [name]. Date of Service: 10/19/2019 10:00 AM Medical Record Number: [id number] Patient Account Number: [id number] Date of Birth/Gender: 03/29/1946 (74 y.o. F) Treating [name]: Jessica Perna Primary Care Physician: Jessica [name] Other Clinician: Referring Physician: Annita [name] Treating Physician/Extender: Skeet Simmer in Treatment: 125 Education Assessment Education Provided To: Patient Education Topics Provided Wound/Skin Impairment: Handouts: Caring for Your Ulcer Methods: Demonstration, Explain/Verbal Responses: State content correctly Electronic Signature(s) Signed: 10/19/2019 3:29:06 PM By: Jessica Perna Entered By: Jessica Perna on 10/19/2019 10:48:52 Jessica [name] ([id number]) -------------------------------------------------------------------------------- Wound Assessment Details [name]. Date of Service: 10/19/2019 10:00 AM Medical Record Number: [id number] Patient Account Number: [id number] Date of Birth/Sex: Sep 28, 1945 (74 y.o. F) Treating [name]: Jessica [name] Primary Care Azel Gumina: Jessica [name] Other Clinician: Referring Roselyn Doby: Jessica [name] Treating Zayda Angell/Extender: STONE III, Jessica [name] in Treatment: 125 Wound Status Wound Number: 1 Primary Etiology: Pressure Ulcer Wound Location: Medial Sacrum Wound Status: Open Wounding Event: Pressure Injury Date Acquired: 01/15/2017 [name]  Of Treatment: 125 Clustered Wound: No Photos Wound Measurements Length: (cm) 7 % Reduc Width: (cm) 4.5 % Reduc Depth: (cm) 1.8 Epithel Area: (cm) 24.74 Underm Volume: (cm) 44.532 Sta Endi Maxi tion in Area: 32.9% tion in Volume: -20.8% ialization: Small (1-33%) ining: Yes rting Position (o'clock): 8 ng Position (o'clock): 12 mum Distance: (cm) 3.4 Wound Description Classification:  Category/Stage IV Foul O Wound Margin: Distinct, outline attached Slough Exudate Amount: Large Exudate Type: Serosanguineous Exudate Color: red, brown dor After Cleansing: No /Fibrino Yes Wound Bed Granulation Amount: Medium (34-66%) Exposed Structure Granulation Quality: Red, Pink Fascia Exposed: No Necrotic Amount: Medium (34-66%) Fat Layer (Subcutaneous Tissue) Exposed: Yes Necrotic Quality: Adherent Slough Tendon Exposed: No Muscle Exposed: Yes Necrosis of Muscle: No Joint Exposed: No Bone Exposed: No Treatment Notes Wound #1 (Medial Sacrum) Jessica [name] ([id number]) Notes Scell, gauze, ABD, tape Electronic Signature(s) Signed: 10/19/2019 5:43:28 PM By: Jessica [name], BSN, [name], CWS, Jessica [name], BSN Entered By: Jessica [name], BSN, [name], CWS, Jessica on 10/19/2019 10:35:53 Jessica [name] ([id number]) -------------------------------------------------------------------------------- Wound Assessment Details [name]. Date of Service: 10/19/2019 10:00 AM Medical Record Number: [id number] Patient Account Number: [id number] Date of Birth/Sex: 11/09/1945 (74 y.o. F) Treating [name]: Jessica [name] Primary Care Henley Blyth: Jessica [name] Other Clinician: Referring Kwinton Maahs: Jessica [name] Treating Kuron Docken/Extender: STONE III, Jessica [name] in Treatment: 125 Wound Status Wound Number: 7 Primary Etiology: Pressure Ulcer Wound Location: Right, Midline Sacrum Wound Status: Open Wounding Event: Pressure Injury Date Acquired: 10/12/2019 [name] Of Treatment: 0 Clustered Wound: No Photos Wound Measurements Length: (cm) 4 Width: (cm) 2.5 Depth: (cm) 0.2 Area: (cm) 7.854 Volume: (cm) 1.571 % Reduction in Area: % Reduction in Volume: Epithelialization: None Tunneling: No Undermining: No Wound Description Classification: Category/Stage II Wound Margin: Flat and Intact Exudate Amount: Medium Exudate Type: Serous Exudate Color: amber Foul Odor After Cleansing: No Slough/Fibrino  Yes Wound Bed Granulation Amount: Medium (34-66%) Exposed Structure Granulation Quality: Red Fascia Exposed: No Necrotic Amount: Medium (34-66%) Fat Layer (Subcutaneous Tissue) Exposed: Yes Necrotic Quality: Adherent Slough Tendon Exposed: No Muscle Exposed: No Joint Exposed: No Bone Exposed: No Treatment Notes Wound #7 (Right, Midline Sacrum) Notes Scell, gauze, ABD, tape Electronic Signature(s) Signed: 10/19/2019 5:43:28 PM By: Jessica [name], BSN, [name], CWS, Jessica [name], BSN [place], [place] ([id number]) Entered By: Jessica [name], BSN, [name], CWS, Jessica on 10/19/2019 10:38:13 Jessica [name] ([id number]) -------------------------------------------------------------------------------- Vitals Details [name]. Date of Service: 10/19/2019 10:00 AM Medical Record Number: [id number] Patient Account Number: [id number] Date of Birth/Sex: 1945/08/12 (74 y.o. F) Treating [name]: Jessica [name] Primary Care Naava Janeway: Jessica [name] Other Clinician: Referring Mikiala Fugett: Jessica [name] Treating Kailynn Satterly/Extender: STONE III, Jessica [name] in Treatment: 125 Vital Signs Time Taken: 10:24 Temperature (F): 99.3 Height (in): 70 Pulse (bpm): 105 Respiratory Rate (breaths/min): 18 Blood Pressure (mmHg): 144/73 Reference Range: 80 - 120 mg / dl Electronic Signature(s) Signed: 10/19/2019 5:43:28 PM By: Jessica [name], BSN, [name], CWS, Jessica [name], BSN Entered By: Jessica [name], BSN, [name], CWS, Jessica on 10/19/2019 10:26:07

## 2019-10-27 ENCOUNTER — Encounter: Payer: Self-pay | Admitting: Physical Medicine and Rehabilitation

## 2019-10-27 ENCOUNTER — Other Ambulatory Visit: Payer: Self-pay

## 2019-10-27 ENCOUNTER — Encounter
Payer: Medicare Other | Attending: Physical Medicine and Rehabilitation | Admitting: Physical Medicine and Rehabilitation

## 2019-10-27 VITALS — BP 135/61 | HR 87 | Temp 98.1°F | Ht 70.0 in | Wt 214.5 lb

## 2019-10-27 DIAGNOSIS — R6521 Severe sepsis with septic shock: Secondary | ICD-10-CM | POA: Diagnosis not present

## 2019-10-27 DIAGNOSIS — A419 Sepsis, unspecified organism: Secondary | ICD-10-CM

## 2019-10-27 NOTE — Progress Notes (Addendum)
Subjective:    Patient ID: Jessica [name], female    DOB: [birth year], 74 y.o.   MRN: [id number]  HPI  Due to patient's severely impaired mobility, an audio/video tele-health visit is felt to be the most appropriate encounter for this patient at this time. See MyChart message from today for the patient's consent to a tele-health encounter with [facility]. This is a follow up tele-visit via phone. The patient is at home. MD is at office.   Jessica [name] presents today for phone visit after CIR admission for severe sepsis. Phone visit performed today as it is difficult for her to be transported outside of the house given her severe mobility deficits.   Her son provides the history. She lives with her husband and son. She has been doing well at home. Sleeping well. Eating well. Her son called her PCP after she left the hospital but has not yet heard back from them. He has all required medications at this time. He has been checking her HR and oxygen levels.   No other questions or concerns at this time. He would like to follow-up for check-in again in 1 month.    Pain Inventory Average Pain 0 Pain Right Now 0 My pain is no pain  In the last 24 hours, has pain interfered with the following? General activity 0 Relation with others 0 Enjoyment of life 0 What TIME of day is your pain at its worst? no pain Sleep (in general) Good  Pain is worse with: no pain Pain improves with: no pain Relief from Meds: no pain  Mobility how many minutes can you walk? cannot walk use a wheelchair transfers alone  Function I need assistance with the following:  dressing, bathing, toileting, meal prep, household duties and shopping  Neuro/Psych trouble walking  Prior Studies Any changes since last visit?  no  Physicians involved in your care Any changes since last visit?  no   Family History  Problem Relation Age of Onset  . Heart disease Father   . Deep  vein thrombosis Father    Social History   Socioeconomic History  . Marital status: Married    Spouse name: Not on file  . Number of children: Not on file  . Years of education: Not on file  . Highest education level: Not on file  Occupational History  . Not on file  Tobacco Use  . Smoking status: Never Smoker  . Smokeless tobacco: Former Neurosurgeon    Types: Snuff  Vaping Use  . Vaping Use: Never used  Substance and Sexual Activity  . Alcohol use: No  . Drug use: No  . Sexual activity: Not on file  Other Topics Concern  . Not on file  Social History Narrative  . Not on file   Social Determinants of Health   Financial Resource Strain:   . Difficulty of Paying Living Expenses:   Food Insecurity:   . Worried About Programme researcher, broadcasting/film/video in the Last Year:   . Barista in the Last Year:   Transportation Needs:   . Freight forwarder (Medical):   Marland Kitchen Lack of Transportation (Non-Medical):   Physical Activity:   . Days of Exercise per Week:   . Minutes of Exercise per Session:   Stress:   . Feeling of Stress :   Social Connections:   . Frequency of Communication with Friends and Family:   . Frequency of Social Gatherings with  Friends and Family:   . Attends Religious Services:   . Active Member of Clubs or Organizations:   . Attends Banker Meetings:   Marland Kitchen Marital Status:    Past Surgical History:  Procedure Laterality Date  . ABDOMINAL HYSTERECTOMY    . BACK SURGERY    . HEMORROIDECTOMY    . IR RADIOLOGIST EVAL & MGMT  09/01/2017   Past Medical History:  Diagnosis Date  . Chronic pain   . Depression   . Diastolic heart failure (HCC)   . HLD (hyperlipidemia)   . HTN (hypertension)   . Hypothyroidism   . Morbid obesity (HCC)   . Recurrent UTI   . Spinal stenosis   . Visual hallucinations    BP 135/61 Comment: last recorded/ not able to check at home  Pulse 87 Comment: last recorded/ not able to check at home  Temp 98.1 F (36.7 C) Comment:  last recorded  Ht 5\' 10"  (1.778 m) Comment: last recorded  Wt 214 lb 8 oz (97.3 kg) Comment: last recorded/ not able to check at home  BMI 30.78 kg/m   Opioid Risk Score:   Fall Risk Score:  `1  Depression screen PHQ 2/9  Depression screen PHQ 2/9 10/11/2017  Decreased Interest 0  Down, Depressed, Hopeless 0  PHQ - 2 Score 0    Review of Systems  Constitutional: Negative.   HENT: Negative.   Eyes: Negative.   Respiratory: Negative.   Cardiovascular: Negative.   Gastrointestinal: Negative.   Endocrine: Negative.   Genitourinary: Negative.   Musculoskeletal: Positive for gait problem.  Skin: Positive for wound.  Allergic/Immunologic: Negative.   Neurological: Positive for weakness.  Hematological: Negative.   Psychiatric/Behavioral: Negative.   All other systems reviewed and are negative.      Objective:   Physical Exam Not performed since visit was conducted via phone.    Assessment & Plan:  Jessica [name] is a 74 year old woman who was recently hospitalized for urosepsis. She presents by phone for hospital follow-up.   -She has been doing well at home. Eating well. Sleeping well. She is under the care of her husband and son. She requires no refills at this time. Her son prefers continued follow-up by phone in 1 month. I provided him with my cell phone number should he have any more questions before then. [facility] hospitalization and outpatient wound care notes reviewed.   10 minutes of patient care time were spent during this visit. All questions were encouraged and answered.  RTC by phone in 6 weeks for follow-up.

## 2019-11-09 ENCOUNTER — Encounter: Payer: Medicare Other | Admitting: Physician Assistant

## 2019-11-09 DIAGNOSIS — L89154 Pressure ulcer of sacral region, stage 4: Secondary | ICD-10-CM | POA: Diagnosis not present

## 2019-11-09 NOTE — Progress Notes (Addendum)
CYNIA, ABRUZZO ([id number]) Visit Report for 11/09/2019 Chief Complaint Document Details [name], Jessica [name]. Date of Service: 11/09/2019 10:45 AM Medical Record Number: [id number] Patient Account Number: [id number] Date of Birth/Sex: 1945/08/26 (74 y.o. F) Treating RN: Huel Coventry Primary Care Provider: Annita Brod Other Clinician: Referring Provider: Annita Brod Treating Provider/Extender: Linwood Dibbles, Theoren Palka Weeks in Treatment: 128 Information Obtained from: Patient Chief Complaint she is here for evaluation of a sacral ulcer Electronic Signature(s) Signed: 11/09/2019 11:01:46 AM By: Lenda Kelp PA-C Entered By: Lenda Kelp on 11/09/2019 11:01:45 [name] ([id number]) -------------------------------------------------------------------------------- Debridement Details Patient Name: [name]. Date of Service: 11/09/2019 10:45 AM Medical Record Number: [id number] Patient Account Number: [id number] Date of Birth/Sex: [birth year] (74 y.o. F) Treating RN: Huel Coventry Primary Care Provider: Annita Brod Other Clinician: Referring Provider: Annita Brod Treating Provider/Extender: STONE III, Suan Pyeatt Weeks in Treatment: 128 Debridement Performed for Wound #7 Right,Midline Sacrum Assessment: Performed By: Physician STONE III, Antrice Pal E., PA-C Debridement Type: Debridement Level of Consciousness (Pre- Awake and Alert procedure): Pre-procedure Verification/Time Out Yes - 11:34 Taken: Start Time: 11:34 Pain Control: Lidocaine Total Area Debrided (L x W): 4.9 (cm) x 3.9 (cm) = 19.11 (cm) Tissue and other material Viable, Non-Viable, Eschar, Slough, Subcutaneous, Slough debrided: Level: Skin/Subcutaneous Tissue Debridement Description: Excisional Instrument: Curette Bleeding: Moderate Hemostasis Achieved: Pressure Response to Treatment: Procedure was tolerated well Level of Consciousness (Post- Awake and Alert procedure): Post Debridement  Measurements of Total Wound Length: (cm) 4.9 Stage: Category/Stage II Width: (cm) 3.9 Depth: (cm) 0.5 Volume: (cm) 7.504 Character of Wound/Ulcer Post Debridement: Stable Post Procedure Diagnosis Same as Pre-procedure Electronic Signature(s) Signed: 11/09/2019 5:01:28 PM By: Lenda Kelp PA-C Signed: 11/09/2019 6:25:55 PM By: Elliot Gurney, BSN, RN, CWS, Kim RN, BSN Entered By: Elliot Gurney, BSN, RN, CWS, Kim on 11/09/2019 11:36:20 [name] ([id number]) -------------------------------------------------------------------------------- HPI Details Patient Name: [name]. Date of Service: 11/09/2019 10:45 AM Medical Record Number: [id number] Patient Account Number: [id number] Date of Birth/Sex: [birth year] (74 y.o. F) Treating RN: Huel Coventry Primary Care Provider: Annita Brod Other Clinician: Referring Provider: Annita Brod Treating Provider/Extender: Linwood Dibbles, Keimora Swartout Weeks in Treatment: 128 History of Present Illness HPI Description: 05/27/17-she is here in initial evaluation for a left-sided sacral stage IV pressure ulcer and bilateral lower extremity, lateral aspect, unstageable pressure ulcers. She is accompanied by her husband and her son, who are her primary caregivers. She is bedbound secondary to spinal stenosis. According to her son and husband she was hospitalized from 10/5-11/1 with healthcare associated pneumonia and altered mental status. During her hospitalization she was intubated, extubated on 10/27. She was discharged with Foley catheter and follow up with urology. According to her son and spouse she developed the sacral ulcer during hospitalization. Home health has been applying Santyl daily. She does have a low air loss mattress and is repositioned every 2-3 hours per her family's report. According to the son and spouse she had an appointment with urology on 12/18 and during that appointment developed discoloration to her bilateral lower extremities which  ultimately developed into unstageable pressure ulcers to the lateral aspects of her bilateral lower extremities. There has been no topical treatment applied to these. She continues to have home health. There is no concerns expressed regarding dietary intake, stating she eats 3 meals a day, eating was provided; she is supplemented with boost with protein. 06/03/17-she is here in follow-up evaluation for sacral and bilateral lower extremity ulcers. Plain film x-ray done today reveals no distraction  to the sacrum or coccyx, no visible abnormalities. Home health has ordered the negative pressure wound system but it has not been initiated. We will continue with Hydrofera Blue until initiation of negative pressure wound system and continue with Santyl to bilateral lower extremity ulcers. Follow-up next week 06/10/17-she is here in follow-up evaluation for sacral and bilateral lower extremity ulcers. The wound VAC will be available tomorrow per home health. We will initiate wound VAC therapy to the sacral ulcer 3 times weekly (Thursday, Saturday/Sunday, Tuesday). We will continue with Santyl to the lower extremity ulcers. The patient's son is checking into home health therapy over the weekend for [facility] changes, with the understanding that if VAC changes cannot be performed over the weekend he will need to change his mother's appointments to Monday, Wednesday or Friday. The sacral ulcer clinically does not appear infected but there has been a change in the amount of drainage acutely, there is no significant amount of devitalized tissue, there is no malodor. Wound culture was obtained to evaluate for occult infection we will hold off on antibiotic therapy until sensitivities are resulted. 06/18/17 on evaluation today patient appears to be doing fairly well in my opinion although this is the first time I have seen this patient she has been previously evaluated by Tacey Ruiz here in our office. She is going to be switching  to Fridays to see me due to the Wound VAC schedule being changed on Monday, Wednesday, and Friday. Subsequently she seems to be doing fairly well with the Wound VAC. Her son who was present during evaluation today states that he somewhat stresses of the Wound VAC in making sure that it was functioning appropriately. With that being said everything seems to be working well he knows what settings on the [facility] itself to look at and ensure that it is functioning properly. Overall the wound appears to be nice and clean there is no need for debridement today. She has no discomfort in her bilateral lower extremity ulcers also appear to be improving based on measurements and what this honest tell me about the overall appearance. 07/02/17 on evaluation today I noted in patients wound bed that was actually an odor that had not previously been noted. Subsequently there was also a small area of bone that was not previously noted during my last evaluation. This did appear to be necrotic and was being somewhat forced out by the body around the region of granulation. She does not have any pain which is good news. Nonetheless the overall appearance of the ulcer is making me concerned for the patient having developed osteomyelitis. Currently she is not been on any antibiotics and the Wound VAC has been doing fairly well in general. With that being said I do not think we need to continue the Wound VAC if she potentially has a bone infection. At least not until it is properly addressed with antibiotics. 07/09/17 on evaluation today patient appears to actually be doing very well in regard to her sacral ulcer compared to last week. There is actually no exposed bone at this point. 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 With that being said still I do believe she needs  to have a referral to infectious disease due to the osteomyelitis I am gonna recommend that she continue with the doxycycline based on the culture results which showed  Eikenella Corrodens as the organism identified that tetracycline should work for. She does not seem to be having any discomfort whatsoever at this point. 07/16/17 on evaluation today patient actually appears to be doing rather well in regard to her sacral wound. I think that the original wound site actually appears much better than previously noted. With that being said she does have a new superficial injury on the right sacral region which appears to be due to according to the sign transport that occurred for her MRI unfortunately. Fortunately this is not too deep and I do think it can be managed but it was a new area that was not previously noted. Otherwise she has been tolerating the Dakinos soaked gauze packing without complication. 07/30/17 on evaluation today patient presents for reevaluation concerning her sacral wound. She has been tolerating the dressing changes without complication. With that being said her wound is doing so well at this point that I think she may be at the point where we could reinitiate the Wound VAC currently and hopefully see good results from this. I'm very pleased with how she has responded to the Dakinos soaked gauze packing. 08/13/17 evaluation today patient appears to be doing excellent in regard to her lower extremity ulcer this seems to be cleaning up very nicely. In regard to the sacral ulcer the area of trauma on the right lateral portion of the wound actually appears to be much better than previously noted during the last evaluation. The word has definitely filled in and the Wound VAC appears to be helpful I do believe. Overall I'm very pleased with how things seem to be progressing at this time. Patient likewise is also happy that things are doing well. 08/27/17 on evaluation today patient presents  for follow-up concerning her ongoing issues with her sacral ulcer and lower extremity ulcer. Fortunately she has been tolerating the dressing changes well complication the Wound VAC in general seems to have done very well up to this point. She does not have any evidence of infection which is good news. She does have some dark discoloration in central portion of the wound which is troublesome for the possibility of pressure injury to the site it sounds like her prior air mattress was not functioning properly her son has DEVORA, TORTORELLA. ([id number]) just bought a new one for her this is doing much better. Otherwise things seem to be progressing nicely. 09/10/17 on evaluation today patient presents for follow-up concerning her sacral wound and lower extremity ulcer. She has been tolerating the switch of her dressing changes to the Dakinos soaked gauze dressing very well in regard to the sacrum. The left lateral lower extremity ulcer appears to have a new area open just distal to the one that we have been treating with Santyl and this is new since her last visit. There does not appear to be any evidence of infection which is good news. With that being said there's a lot of maceration here at the site and I feel this is likely due to the switch and the dressings it appears that due to the sticking of the dressings it will switch to utilizing a telfa pad over the Santyl. I think this caused a lot of drainage to be collected and situated in the region just under the dressing which has led to this causing which duration of breakdown. Overall there does not appear to be any significant pain which is good news. Of note I did actually have a conversation with the  radiologist who is an interventional radiologist with [facility]. 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 With that being said there does not appear to be any rush to  get this done according to the interventional radiologist whom I spoke with. Therefore after discussion it was determined that there gonna wait about six months prior to considering the procedure to get time for hopefully the sacral wound to heal in the interim to at least some degree. 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 According to nursing staff these are all improved, the negative pressure wound therapy system was put on hold last week. We will resume negative pressure wound therapy today, continue with Santyl to the lower extremity and she will follow-up next week. 09/24/17 on evaluation today unfortunately the patient's wound appears to be doing significantly worse compared to last week's evaluation and even my valuation the week before. She actually has bone noted on the left wound margin in the region of undermining unfortunately. This was not noted during the last evaluation. I am concerned about the fact that this seems to be worse not better since our last visit with her. My biggest concern is that she's likely developing a worsening osteomyelitis. This was discussed with patient and her son today during the office visit. 05/03/17 on evaluation today patient actually appears to be doing rather well in regard to her sacral ulcer compared to last week's evaluation. She has been tolerating the dressing changes without complication. Fortunately even though we're not utilizing the Wound VAC she seems to be making some strides in seeing the wound area overall improved quite dramatically in my pinion in just one weeks time. She also seems to be staying off of this to the point that I do not see any evidence of new injury which is excellent news. Whatever she's been doing in that regard over the past week I would want her to continue. I did also review the results of her bone culture which revealed group B strep as the responsible organism. Again  this is what's causing her osteomyelitis she does have infectious disease appointment scheduled for 10/11/17 10/08/1898 valuation today patient appears to be doing better for the most part in regard to the sacral wound. Overall she is showing signs of having granulation of the bone which is good news. There is an area towards the left of the wound where she does seem to be showing some signs of opening this it would be due to additional pressure to the area unfortunately. There does not appear to be any evidence of infection spreading which is good news that do not see any evidence of significant purulent discharge which is also good news. 10/26/17 on evaluation today patient sacral ulcer actually appears to be very healthy and doing well in regard to the overall appearance of the wound. With that being said she does seem to be having some issues at this point in time with some shear/friction injury of the sacrum from where she was transported to [facility] for her infectious disease appointment. She has been placed on IV antibiotic therapy I will have to go back and find that note for review as I did not have access to that today. Nonetheless I will add the next visit be sure to document the exact antibiotics that she is taking at this point. Nonetheless I do believe the wound is  doing better there's no bone exposure nothing that requires debridement in regard to the sacral wound. Likewise her left lateral lower extremity ulcer also appears to be doing significantly better. In general I feel like things are showing signs of improvement all around. 11/12/17 on evaluation today patient appears to be doing rather well in regard to her sacral wound. In general I do see signs of granulation at this point. Fortunately there does not appear to be any evidence of infection which is good news as well. In general overall happy with the appearance. With that being said I'll be see I would like for this to be  progressing more rapidly as with the patient and her son but nonetheless at least things do not appear to be doing worse. Her lower extremity ulcer is doing significantly better. 11/26/17 on evaluation today patient appears to be doing a little better in regard to the sacral wound especially in the peripheral of the wound bed. She actually did have an abrasion on the right gluteal region that's completely resolved to this point. Her lower extremity also appears to be for the most part completely resolved. Overall the sacrum itself seems to be the one thing that is still open and given her trouble. No fevers, chills, nausea, or vomiting noted at this time. She actually has an appointment next week with infectious disease for recheck to see how things are doing. She maybe switch to oral medications at that point. 12/09/17 on evaluation today patient actually appears to be doing much better in regard to her sacral wound. I do feel like she has made good progress at this time as far as healing is concerned. In fact the wound looks better to me today than it has in quite some time. She is on oral antibiotic therapy and no longer on the IV antibiotics. She did see infectious disease last week. 12/24/17 on evaluation today patient's wounds actually appear to be doing decently well in regard to the sacral wound in particular. When it comes to her lower extremity ulcers both appear to be completely healed at this point in the one area where she had lived the rash has completely resolved at this time. Overall very pleased with the progress that has been made. Nonetheless the patient seems to be doing well in general. She still does have quite a bit of healing to do in regard to the sacral wound but I feel like she is making progress. 01/07/18 on evaluation today patient actually appears to be doing excellent today regard to her sacral wound. The granular quality in the base of the wound is excellent and shown signs  of good improvement there's no evidence of contusion nor deep tissue injury and the periwound actually appears to be doing well. In general I'm very pleased with the overall appearance. 02/04/18 on evaluation today patient's wound actually appears to be doing decently well as far as the granulation is concerned. She has been tolerating the dressing changes without complication. Right now we are still using the Wound VAC. Nonetheless overall there apparently is some cater to the wound and this is concerning simply for the fact that she could be developing a soft tissue infection again. She's had this happen in the past and at that time responded very well to the antibiotics. Nonetheless I don't think I would likely put her on anything prophylactically but rather wait for the results of the culture to return. 02/18/18 on evaluation today patient actually appears to be doing  fairly well. She has been tolerating the dressing changes. And the Wound VAC seems to be doing well. Overall I'm very pleased with how things have improved over the past couple weeks since I last saw her. Fortunately there does not appear to be any evidence of overt infection at this time which is good news. She has been taking the antibiotics without complication. Specifically that is the Bactrim DS which was prescribed on 02/08/18. 03/18/18 on evaluation today patient's wound actually does have quite a bit of odor noted compared to previous. With that being said the wound itself overall does not appear to be too significant or worse. There is no event noted in the wound bed which is good news. With that being said the patient has been worried as the home health nurse stated that she thought she saw bone in the base of the wound. Nonetheless there's a little Budnick, Eulala W. ([id number]) bit of bruising along the left lateral border of the wound bed which again does not appear to be terrible we have noticed this before but other  than this the majority the wound bed appears to be doing excellent in my pinion. I am wondering if there may be some bacterial colonization. This could be leading to some of the odor that we are noting with the Wound VAC. 04/01/18 on evaluation today patient appears to actually be doing well in regard to the overall appearance of the wound itself. She also does not have any odor at this point in regard to the wound surface and I do believe the Bacons is extremely helpful in this regard. With that being said she does have a little bit of deep tissue injury on the medial aspect of the wound. Again last time it was more lateral. The lateral seems to have improved quite well. Nonetheless she also notes by way of her son that this has been very macerated in the past several weeks since I last saw her. Though things overall seem to be doing better I still think that the Wound VAC may help with managing her fluid better I'm wondering if we can attempt a gauze Wound VAC my hope is that this will be able to manage the moisture control as well as continuing with managing the odor of the wound and bacterial load since the Dakinos has done excellent in helping in that regard. 04/15/17 on evaluation today patient actually appears to be doing much better in regard to the overall appearance of her sacral wound. I do believe that the gauze Vac has been of benefit for her. Overall she is tolerating this without complication which is also good news. There is no sign of injury to the sacral region at this point. 04/29/18 on evaluation today patient appears to be doing well in regard to her sacral ulcer. She sure signs of good granulation and the Gauze VAC is doing very well for her. Overall I'm pleased in this regard. The biggest issue is that the time from Friday till Monday seems to be quite long for her as far as the amount of time to keep the Wound VAC sealed. For that reason her son wonders if he can take this off on  Sunday morning in order to give her day of just wet to dry dressing's with the Dakin's solution I think this would be okay. 05/13/18 on evaluation today patient appears to be doing okay in regard to her sacral ulcer. There does not appear to be any evidence of  overt infection at this time which is good news. In general the drainage and moisture appears to be significantly improved this was after the pressure on the Wound VAC was increased after they contacted our office. With that being said a lot of the dark tissue around the edge of the wound has improved in the maceration is significantly better this seems to be managing much better. Fortunately there is no signs of infection at this time. Overall the patient is doing quite well. The wound measurements are not significantly smaller today although they were over the past two visits we will see how things go. 05/27/18 on evaluation today patient's sacral wound actually appears to be doing fairly well at this point. She does have a deep tissue injury yet again on the medial aspect of the wound which is something that we seem to be seeing often. There is some question as to whether or not this is actually being caused by the Wound VAC bunching up and then her somewhat laying on it even though they are attempting to offload this as best as possible. Patient son states that overall they do the best they can trying offload her and I definitely believe him. I may want to see about putting the Wound VAC on hold for a couple weeks and see if this happens just with standard dressing changes. 06/10/18 on evaluation today patient actually appears to be doing better in regard to her sacral wound. Overall there's no signs of infection, no odor, and no significant skin breakdown around the edge of the wound I feel like this is done much better 50 Dakinos soaked gauze dressing as opposed to legalizing anything such as the Wound VAC. I believe that we discontinue the  Wound VAC as of today. 06/24/18 on evaluation today patient actually appears to be doing well in regard to her sacral ulcer. There's no signs of infection, no odor, and in my pinion no concerns at all. I feel like she is making good progress as far as I'm concerned. 07/12/18 on evaluation today patient appears to be doing very well in regard to her sacral ulcer. She has been tolerating the dressing changes without complication. Fortunately there does not appear to be any evidence of active infection at this time she is doing excellent as far as I'm concerned at this point. 08/11/18 on evaluation today patient's sacral wound actually appears to be doing quite well at this point. The main issue I see is that she continues to have some maceration and skin breakdown due to moisture over the right periwound location unfortunately. We've investigated the possibility of using zinc oxide before but have not utilized it simply due to the fact that she continues to state. Her son anyways that she is allergic to this. They have not tried it recently and may be completely unrelated to the reaction she had before. Nonetheless this may be something you want to attempt will discuss that in the plan. 10/04/18 on evaluation today patient actually appears to be doing quite well in regard to her sacral wound. It's been roughly 2 months since I last seen her in that time her wound has definitely made progress. This is very slow but again nonetheless does seem to be trending in the correct direction. Overall very pleased in this regard. No fevers, chills, nausea, or vomiting noted at this time. 11/07/2018 on evaluation today patient appears to be doing decently well with regard to her sacral wound based on what I am  seeing at this point. She is seen by way of a telehealth visit due to the COVID-19 national emergency and to be honest the patient is somewhat reluctant to come out to be seen for visits which I completely  understand. They feel much safer keeping her home as much as possible due to her adequate condition in general. The only issue that she has had recently she did have some wheezing and subsequently chest congestion she was just placed on albuterol along with prednisone at this point. She seems to be doing much better even after 24 hours of the prednisone according to her [name]. Overall they are pleased with how things seem to be progressing. 11/28/2018 upon evaluation today patient is seen by way of telehealth visit since she is still somewhat reluctant to come out due to the COVID-19 pandemic. Subsequently she seems to be doing well and infected I could not obtain actual measurements for the wound the overall appearance of the wound is dramatically improved. She does not appear to have nearly as much space at the 12:00 location and in fact her [name] who is taking care of her mainly states that he is barely able to get anything up into that region is filled and so nicely. Overall I feel like she is making great progress. 12/12/2018 patient is seen today via a telehealth visit again secondary to her concerns about coming out during the COVID-19 national pandemic. Nonetheless she appears to be doing quite well at this time which is good news. There is no signs of active infection at this time. Overall patient seems to be very pleased as is her son who takes very good care of her. 01/16/2019 on evaluation today patient actually appears to be doing very well with regard to her wound. We have been doing zoom calls quite a bit since the COVID-19 pandemic began just due to the fact that she has been somewhat concerned about coming out. With that being said she seems to have done very well and in fact the wound is measuring quite a bit smaller today compared to the last measurement. Fortunately there is no signs of active infection at this time. She is also having great improvement overall in her demeanor  she is very upbeat and pleased with how things have progressed. 02/21/2019 upon evaluation today patient actually appears to be doing very well with regard to her wound. She has been tolerating the dressing changes her sons been performing these without complication and seems to do an excellent job in my opinion. I am actually very pleased with the COSETTE, PRINDLE. ([id number]) overall appearance. The wound is significantly smaller compared to what its been in the past by quite a bit. 03/21/19 on evaluation today patient appears to be doing excellent in regard to her wound. She's been tolerating the dressing changes without complication and there does not appear to be evidence of active infection which is good news. She is still tolerating the Dakinos Moistened gauze packing without complication. 04/18/19 on evaluation today patient is seen by way of a telehealth visit due to her concerns of being exposed to Covid-19 Virus if she comes into the clinic. Subsequently her son is present to help with the visit today. They have been performing the dressing changes specifically Dakins soloution at this point in order to help with granulation in the base of the wound. Fortunately there is no evidence of active infection currently. Her son does note that he feels like maybe some  bone in the base of the wound centrally that he is somewhat concerned about. With that being said it's not entirely visible but he states that he can palpate it. 05/25/2019 upon evaluation today patient appears to be doing very well in fact in regard to her wounds. I am very pleased with how things seem to be progressing and though she is not showing any signs of infection I do believe that it may be time to switch the dressing to something a little different I think she has a lot of irritation in the periwound and this is likely coming some from the fact that she does have a lot of moisture. I think the wound is small enough we can  see about initiation of a silver alginate dressing. I think this will help keep the periwound from breaking down. Overall however I am very pleased with how things stand. 06/15/2019 upon evaluation today patient appears to be doing very well with regard to her wound. Overall the periwound also seems to be doing much better. She does have several filament-like projections of her skin on the periwound region that been present for some time unfortunately they keep getting pulled and stuck to the dressing causing this area to bleed. I think we are getting need to shave/trim these down today to help her to more fully heal in this periwound location so this would not continue to be an issue for her. 07/06/2019 upon evaluation today patient actually appears to be doing excellent in regard to her wound. She is showing signs of improvement the wound is measuring smaller and is looking very well. In general I am extremely happy. The areas that we trimmed off and periwound where she had skin tags last week actually seem to have done excellent they never even bled again. 08/15/2019 upon evaluation today patient appears to actually be doing better with regard to her actual wound. Unfortunately the periwound does not appear to be doing quite as well at this time unfortunately. She had pneumonia over the past couple weeks that is why she did not make it into her appointment at the last visit. Subsequently she because of having pneumonia was not able to lay flat or even on her side as she could not breathe well therefore she spent a lot more time sitting up even sleeping sitting up as opposed to laying on her side. I think this is made some trouble for her as far as the periwound of her sacral wound is concerned and that is why she is showing signs of the irritation of the skin around. Nonetheless I think this is something that can be managed at this point she now feels better than pneumonia has cleared and overall I  believe that she is a much better place in that regard to be able to offload. 10/19/2019 upon evaluation today patient appears to be doing well with regard to her wound overall although the periwound unfortunately does have some breakdown today that has been somewhat concerned. Mainly I am concerned about the fact that again previously this was in very good shape now it appears to be very dry and not well protected despite the use of the same. We may want to try something different such as a AandE ointment or something of like to see if that can be beneficial for the patient. I really feel like the alginate is a good option although I think we need to make sure this is being packed into the wound bed  apparently that was not the way the patient's son was doing this previously which I thought he had been to be honest. Nonetheless we will address that today and make appropriate recommendations. 11/09/2019 on evaluation today patient appears to be doing a little more poorly in regard to her wounds currently. There is definitely some signs of pressure injury at this point which does indicate the patient seems to be on her back more than she really should be to be honest. Fortunately there is no signs of active infection at this time which is great news overall I am pleased in that regard. Nonetheless I do believe we need to switch back to the Dakin's moistened gauze at this point based on what I am seeing today. Electronic Signature(s) Signed: 11/09/2019 1:23:35 PM By: Lenda Kelp PA-C Entered By: Lenda Kelp on 11/09/2019 13:23:35 [name] ([id number]) -------------------------------------------------------------------------------- Physical Exam Details Patient Name: [name]. Date of Service: 11/09/2019 10:45 AM Medical Record Number: [id number] Patient Account Number: [id number] Date of Birth/Sex: [birth year] (74 y.o. F) Treating RN: Huel Coventry Primary Care Provider: Annita Brod Other Clinician: Referring Provider: Annita Brod Treating Provider/Extender: STONE III, Shikha Bibb Weeks in Treatment: 128 Constitutional Chronically ill appearing but in no apparent acute distress. Respiratory normal breathing without difficulty. Psychiatric this patient is able to make decisions and demonstrates good insight into disease process. Alert and Oriented x 3. pleasant and cooperative. Notes Upon inspection patient's wound bed actually showed signs of good granulation and some areas that there was some eschar and skin breakdown and others. Fortunately there is no evidence of active infection at this time systemically or locally but I do believe pressure is still a significant issue here. I think they need to be very cautious on exactly what is going on and how to best eliminate the pressure if we can see any chance of getting this to heal as we wanted to. Electronic Signature(s) Signed: 11/09/2019 1:32:39 PM By: Lenda Kelp PA-C Entered By: Lenda Kelp on 11/09/2019 13:32:38 [name] ([id number]) -------------------------------------------------------------------------------- Physician Orders Details Patient Name: [name]. Date of Service: 11/09/2019 10:45 AM Medical Record Number: [id number] Patient Account Number: [id number] Date of Birth/Sex: 09-25-1945 (74 y.o. F) Treating RN: Huel Coventry Primary Care Provider: Annita Brod Other Clinician: Referring Provider: Annita Brod Treating Provider/Extender: Linwood Dibbles, Loreen Bankson Weeks in Treatment: 128 Verbal / Phone Orders: No Diagnosis Coding ICD-10 Coding Code Description L89.154 Pressure ulcer of sacral region, stage 4 M48.00 Spinal stenosis, site unspecified R54 Age-related physical debility G89.4 Chronic pain syndrome E78.2 Mixed hyperlipidemia L89.93 Pressure ulcer of unspecified site, stage 3 Wound Cleansing Wound #1 Medial Sacrum o Other: - please cleanse sacral wound with dakins  moistened gauze - do not spray dakins on wound Wound #7 Right,Midline Sacrum o Other: - please cleanse sacral wound with dakins moistened gauze - do not spray dakins on wound Skin Barriers/Peri-Wound Care Wound #1 Medial Sacrum o Skin Prep o Other: - AandD ointment periwound Wound #7 Right,Midline Sacrum o Skin Prep o Other: - AandD ointment periwound Primary Wound Dressing Wound #1 Medial Sacrum o Dakins soaked gauze Wound #7 Right,Midline Sacrum o Dakins soaked gauze Secondary Dressing Wound #1 Medial Sacrum o ABD pad - secure with tape Wound #7 Right,Midline Sacrum o ABD pad - secure with tape Dressing Change Frequency Wound #1 Medial Sacrum o Change dressing twice daily. Wound #7 Right,Midline Sacrum o Change dressing twice daily. Follow-up Appointments Wound #1 Medial Sacrum o Return Appointment in  2 weeks. Wound #7 Right,Midline Sacrum o Return Appointment in 2 weeks. ROSABEL, SERMENO ([id number]) Off-Loading Wound #1 Medial Sacrum o Turn and reposition every 2 hours Wound #7 Right,Midline Sacrum o Turn and reposition every 2 hours Additional Orders / Instructions Wound #1 Medial Sacrum o Increase protein intake. Wound #7 Right,Midline Sacrum o Increase protein intake. Home Health Wound #1 Medial Sacrum o Continue Home Health Visits - [facility] o Home Health Nurse may visit PRN to address patientos wound care needs. o FACE TO FACE ENCOUNTER: MEDICARE and MEDICAID PATIENTS: I certify that this patient is under my care and that I had a face-to-face encounter that meets the physician face-to-face encounter requirements with this patient on this date. The encounter with the patient was in whole or in part for the following MEDICAL CONDITION: (primary reason for Home Healthcare) MEDICAL NECESSITY: I certify, that based on my findings, NURSING services are a medically necessary home health service. HOME BOUND STATUS: I certify  that my clinical findings support that this patient is homebound (i.e., Due to illness or injury, pt requires aid of supportive devices such as crutches, cane, wheelchairs, walkers, the use of special transportation or the assistance of another person to leave their place of residence. There is a normal inability to leave the home and doing so requires considerable and taxing effort. Other absences are for medical reasons / religious services and are infrequent or of short duration when for other reasons). o If current dressing causes regression in wound condition, may D/C ordered dressing product/s and apply Normal Saline Moist Dressing daily until next Wound Healing Center / Other MD appointment. Notify Wound Healing Center of regression in wound condition at [phone]. o Please direct any NON-WOUND related issues/requests for orders to patient's Primary Care Physician Wound #7 Right,Midline Sacrum o Continue Home Health Visits - [facility] o Home Health Nurse may visit PRN to address patientos wound care needs. o FACE TO FACE ENCOUNTER: MEDICARE and MEDICAID PATIENTS: I certify that this patient is under my care and that I had a face-to-face encounter that meets the physician face-to-face encounter requirements with this patient on this date. The encounter with the patient was in whole or in part for the following MEDICAL CONDITION: (primary reason for Home Healthcare) MEDICAL NECESSITY: I certify, that based on my findings, NURSING services are a medically necessary home health service. HOME BOUND STATUS: I certify that my clinical findings support that this patient is homebound (i.e., Due to illness or injury, pt requires aid of supportive devices such as crutches, cane, wheelchairs, walkers, the use of special transportation or the assistance of another person to leave their place of residence. There is a normal inability to leave the home and doing so requires considerable and  taxing effort. Other absences are for medical reasons / religious services and are infrequent or of short duration when for other reasons). o If current dressing causes regression in wound condition, may D/C ordered dressing product/s and apply Normal Saline Moist Dressing daily until next Wound Healing Center / Other MD appointment. Notify Wound Healing Center of regression in wound condition at [phone]. o Please direct any NON-WOUND related issues/requests for orders to patient's Primary Care Physician Electronic Signature(s) Signed: 11/09/2019 5:01:28 PM By: Lenda Kelp PA-C Signed: 11/09/2019 6:25:55 PM By: Elliot Gurney, BSN, RN, CWS, Kim RN, BSN Entered By: Elliot Gurney, BSN, RN, CWS, Kim on 11/09/2019 11:39:44 [name] ([id number]) -------------------------------------------------------------------------------- Problem List Details Patient Name: [name]. Date of Service: 11/09/2019 10:45  AM Medical Record Number: [id number] Patient Account Number: [id number] Date of Birth/Sex: [birth year] (74 y.o. F) Treating RN: Huel Coventry Primary Care Provider: Annita Brod Other Clinician: Referring Provider: Annita Brod Treating Provider/Extender: Linwood Dibbles, Nakeya Adinolfi Weeks in Treatment: 128 Active Problems ICD-10 Encounter Code Description Active Date MDM Diagnosis L89.154 Pressure ulcer of sacral region, stage 4 05/27/2017 No Yes M48.00 Spinal stenosis, site unspecified 05/27/2017 No Yes R54 Age-related physical debility 05/27/2017 No Yes G89.4 Chronic pain syndrome 05/27/2017 No Yes E78.2 Mixed hyperlipidemia 05/27/2017 No Yes L89.93 Pressure ulcer of unspecified site, stage 3 05/27/2017 No Yes Inactive Problems Resolved Problems Electronic Signature(s) Signed: 11/09/2019 11:01:38 AM By: Lenda Kelp PA-C Entered By: Lenda Kelp on 11/09/2019 11:01:37 [name]  ([id number]) -------------------------------------------------------------------------------- Progress Note Details Patient Name: [name]. Date of Service: 11/09/2019 10:45 AM Medical Record Number: [id number] Patient Account Number: [id number] Date of Birth/Sex: 1945/06/28 (74 y.o. F) Treating RN: Huel Coventry Primary Care Provider: Annita Brod Other Clinician: Referring Provider: Annita Brod Treating Provider/Extender: Linwood Dibbles, Trevonn Hallum Weeks in Treatment: 128 Subjective Chief Complaint Information obtained from Patient she is here for evaluation of a sacral ulcer History of Present Illness (HPI) 05/27/17-she is here in initial evaluation for a left-sided sacral stage IV pressure ulcer and bilateral lower extremity, lateral aspect, unstageable pressure ulcers. She is accompanied by her husband and her son, who are her primary caregivers. She is bedbound secondary to spinal stenosis. According to her son and husband she was hospitalized from 10/5-11/1 with healthcare associated pneumonia and altered mental status. During her hospitalization she was intubated, extubated on 10/27. She was discharged with Foley catheter and follow up with urology. According to her son and spouse she developed the sacral ulcer during hospitalization. Home health has been applying Santyl daily. She does have a low air loss mattress and is repositioned every 2-3 hours per her family's report. According to the son and spouse she had an appointment with urology on 12/18 and during that appointment developed discoloration to her bilateral lower extremities which ultimately developed into unstageable pressure ulcers to the lateral aspects of her bilateral lower extremities. There has been no topical treatment applied to these. She continues to have home health. There is no concerns expressed regarding dietary intake, stating she eats 3 meals a day, eating was provided; she is supplemented with boost with  protein. 06/03/17-she is here in follow-up evaluation for sacral and bilateral lower extremity ulcers. Plain film x-ray done today reveals no distraction to the sacrum or coccyx, no visible abnormalities. Home health has ordered the negative pressure wound system but it has not been initiated. We will continue with Hydrofera Blue until initiation of negative pressure wound system and continue with Santyl to bilateral lower extremity ulcers. Follow-up next week 06/10/17-she is here in follow-up evaluation for sacral and bilateral lower extremity ulcers. The wound VAC will be available tomorrow per home health. We will initiate wound VAC therapy to the sacral ulcer 3 times weekly (Thursday, Saturday/Sunday, Tuesday). We will continue with Santyl to the lower extremity ulcers. The patient's son is checking into home health therapy over the weekend for [facility] changes, with the understanding that if VAC changes cannot be performed over the weekend he will need to change his mother's appointments to Monday, Wednesday or Friday. The sacral ulcer clinically does not appear infected but there has been a change in the amount of drainage acutely, there is no significant amount of devitalized tissue, there is no malodor. Wound culture was  obtained to evaluate for occult infection we will hold off on antibiotic therapy until sensitivities are resulted. 06/18/17 on evaluation today patient appears to be doing fairly well in my opinion although this is the first time I have seen this patient she has been previously evaluated by Tacey Ruiz here in our office. She is going to be switching to Fridays to see me due to the Wound VAC schedule being changed on Monday, Wednesday, and Friday. Subsequently she seems to be doing fairly well with the Wound VAC. Her son who was present during evaluation today states that he somewhat stresses of the Wound VAC in making sure that it was functioning appropriately. With that being said  everything seems to be working well he knows what settings on the [facility] itself to look at and ensure that it is functioning properly. Overall the wound appears to be nice and clean there is no need for debridement today. She has no discomfort in her bilateral lower extremity ulcers also appear to be improving based on measurements and what this honest tell me about the overall appearance. 07/02/17 on evaluation today I noted in patients wound bed that was actually an odor that had not previously been noted. Subsequently there was also a small area of bone that was not previously noted during my last evaluation. This did appear to be necrotic and was being somewhat forced out by the body around the region of granulation. She does not have any pain which is good news. Nonetheless the overall appearance of the ulcer is making me concerned for the patient having developed osteomyelitis. Currently she is not been on any antibiotics and the Wound VAC has been doing fairly well in general. With that being said I do not think we need to continue the Wound VAC if she potentially has a bone infection. At least not until it is properly addressed with antibiotics. 07/09/17 on evaluation today patient appears to actually be doing very well in regard to her sacral ulcer compared to last week. There is actually no exposed bone at this point. 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 With that being said still I do believe she needs to have a referral to infectious disease due to the osteomyelitis I am gonna recommend that she continue with the doxycycline based on the culture results which showed Eikenella Corrodens as the organism identified that tetracycline should work for. She does not seem to be having any discomfort whatsoever at this point. 07/16/17 on  evaluation today patient actually appears to be doing rather well in regard to her sacral wound. I think that the original wound site actually appears much better than previously noted. With that being said she does have a new superficial injury on the right sacral region which appears to be due to according to the sign transport that occurred for her MRI unfortunately. Fortunately this is not too deep and I do think it can be managed but it was a new area that was not previously noted. Otherwise she has been tolerating the Dakin s soaked gauze packing without complication. 07/30/17 on evaluation today patient presents for reevaluation concerning her sacral wound. She has been tolerating the dressing changes without complication. With that being said her wound is doing so well at this point that I think she may be at the point where we could  reinitiate the Wound VAC currently and hopefully see good results from this. I'm very pleased with how she has responded to the [facility] s soaked gauze packing. 08/13/17 evaluation today patient appears to be doing excellent in regard to her lower extremity ulcer this seems to be cleaning up very nicely. In regard to the sacral ulcer the area of trauma on the right lateral portion of the wound actually appears to be much better than previously noted during the last evaluation. The word has definitely filled in and the Wound VAC appears to be helpful I do believe. Overall I'm very pleased with how things seem to be progressing at this time. Patient likewise is also happy that things are doing well. LEVINA, BOYACK ([id number]) 08/27/17 on evaluation today patient presents for follow-up concerning her ongoing issues with her sacral ulcer and lower extremity ulcer. Fortunately she has been tolerating the dressing changes well complication the Wound VAC in general seems to have done very well up to this point. She does not have any evidence of infection which is good  news. She does have some dark discoloration in central portion of the wound which is troublesome for the possibility of pressure injury to the site it sounds like her prior air mattress was not functioning properly her son has just bought a new one for her this is doing much better. Otherwise things seem to be progressing nicely. 09/10/17 on evaluation today patient presents for follow-up concerning her sacral wound and lower extremity ulcer. She has been tolerating the switch of her dressing changes to the Dakin s soaked gauze dressing very well in regard to the sacrum. The left lateral lower extremity ulcer appears to have a new area open just distal to the one that we have been treating with Santyl and this is new since her last visit. There does not appear to be any evidence of infection which is good news. With that being said there's a lot of maceration here at the site and I feel this is likely due to the switch and the dressings it appears that due to the sticking of the dressings it will switch to utilizing a telfa pad over the Santyl. I think this caused a lot of drainage to be collected and situated in the region just under the dressing which has led to this causing which duration of breakdown. Overall there does not appear to be any significant pain which is good news. Of note I did actually have a conversation with the radiologist who is an interventional radiologist with [facility]. 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 With that being said there does not appear to be any rush to get this done according to the interventional radiologist whom I spoke with. Therefore after discussion it was determined that there gonna wait about six months prior to considering the procedure to get time for hopefully the sacral wound to heal in the interim to at least some degree. 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 According to nursing staff these are all improved, the negative pressure wound therapy system was put on hold last week. We will resume negative pressure wound therapy today, continue with Santyl to the lower extremity and she will follow-up next week. 09/24/17 on evaluation today unfortunately the patient's wound appears to be doing significantly worse compared to last week's evaluation and even my valuation  the week before. She actually has bone noted on the left wound margin in the region of undermining unfortunately. This was not noted during the last evaluation. I am concerned about the fact that this seems to be worse not better since our last visit with her. My biggest concern is that she's likely developing a worsening osteomyelitis. This was discussed with patient and her son today during the office visit. 05/03/17 on evaluation today patient actually appears to be doing rather well in regard to her sacral ulcer compared to last week's evaluation. She has been tolerating the dressing changes without complication. Fortunately even though we're not utilizing the Wound VAC she seems to be making some strides in seeing the wound area overall improved quite dramatically in my pinion in just one weeks time. She also seems to be staying off of this to the point that I do not see any evidence of new injury which is excellent news. Whatever she's been doing in that regard over the past week I would want her to continue. I did also review the results of her bone culture which revealed group B strep as the responsible organism. Again this is what's causing her osteomyelitis she does have infectious disease appointment scheduled for 10/11/17 10/08/1898 valuation today patient appears to be doing better for the most part in regard to the sacral wound. Overall she is showing signs of having granulation of the bone which is good news. There is an area towards the left  of the wound where she does seem to be showing some signs of opening this it would be due to additional pressure to the area unfortunately. There does not appear to be any evidence of infection spreading which is good news that do not see any evidence of significant purulent discharge which is also good news. 10/26/17 on evaluation today patient sacral ulcer actually appears to be very healthy and doing well in regard to the overall appearance of the wound. With that being said she does seem to be having some issues at this point in time with some shear/friction injury of the sacrum from where she was transported to [facility] for her infectious disease appointment. She has been placed on IV antibiotic therapy I will have to go back and find that note for review as I did not have access to that today. Nonetheless I will add the next visit be sure to document the exact antibiotics that she is taking at this point. Nonetheless I do believe the wound is doing better there's no bone exposure nothing that requires debridement in regard to the sacral wound. Likewise her left lateral lower extremity ulcer also appears to be doing significantly better. In general I feel like things are showing signs of improvement all around. 11/12/17 on evaluation today patient appears to be doing rather well in regard to her sacral wound. In general I do see signs of granulation at this point. Fortunately there does not appear to be any evidence of infection which is good news as well. In general overall happy with the appearance. With that being said I'll be see I would like for this to be progressing more rapidly as with the patient and her son but nonetheless at least things do not appear to be doing worse. Her lower extremity ulcer is doing significantly better. 11/26/17 on evaluation today patient appears to be doing a little better in regard to the sacral wound especially in the peripheral of the wound bed. She actually  did have  an abrasion on the right gluteal region that's completely resolved to this point. Her lower extremity also appears to be for the most part completely resolved. Overall the sacrum itself seems to be the one thing that is still open and given her trouble. No fevers, chills, nausea, or vomiting noted at this time. She actually has an appointment next week with infectious disease for recheck to see how things are doing. She maybe switch to oral medications at that point. 12/09/17 on evaluation today patient actually appears to be doing much better in regard to her sacral wound. I do feel like she has made good progress at this time as far as healing is concerned. In fact the wound looks better to me today than it has in quite some time. She is on oral antibiotic therapy and no longer on the IV antibiotics. She did see infectious disease last week. 12/24/17 on evaluation today patient's wounds actually appear to be doing decently well in regard to the sacral wound in particular. When it comes to her lower extremity ulcers both appear to be completely healed at this point in the one area where she had lived the rash has completely resolved at this time. Overall very pleased with the progress that has been made. Nonetheless the patient seems to be doing well in general. She still does have quite a bit of healing to do in regard to the sacral wound but I feel like she is making progress. 01/07/18 on evaluation today patient actually appears to be doing excellent today regard to her sacral wound. The granular quality in the base of the wound is excellent and shown signs of good improvement there's no evidence of contusion nor deep tissue injury and the periwound actually appears to be doing well. In general I'm very pleased with the overall appearance. 02/04/18 on evaluation today patient's wound actually appears to be doing decently well as far as the granulation is concerned. She has been tolerating  the dressing changes without complication. Right now we are still using the Wound VAC. Nonetheless overall there apparently is some cater to the wound and this is concerning simply for the fact that she could be developing a soft tissue infection again. She's had this happen in the past and at that time responded very well to the antibiotics. Nonetheless I don't think I would likely put her on anything prophylactically but rather wait for the results of the culture to return. 02/18/18 on evaluation today patient actually appears to be doing fairly well. She has been tolerating the dressing changes. And the Wound VAC seems to be doing well. Overall I'm very pleased with how things have improved over the past couple weeks since I last saw her. Fortunately there does not appear to be any evidence of overt infection at this time which is good news. She has been taking the antibiotics without DUANNA, RUNK. ([id number]) complication. Specifically that is the Bactrim DS which was prescribed on 02/08/18. 03/18/18 on evaluation today patient's wound actually does have quite a bit of odor noted compared to previous. With that being said the wound itself overall does not appear to be too significant or worse. There is no event noted in the wound bed which is good news. With that being said the patient has been worried as the home health nurse stated that she thought she saw bone in the base of the wound. Nonetheless there's a little bit of bruising along the left lateral border of the wound bed  which again does not appear to be terrible we have noticed this before but other than this the majority the wound bed appears to be doing excellent in my pinion. I am wondering if there may be some bacterial colonization. This could be leading to some of the odor that we are noting with the Wound VAC. 04/01/18 on evaluation today patient appears to actually be doing well in regard to the overall appearance of the wound  itself. She also does not have any odor at this point in regard to the wound surface and I do believe the Bacons is extremely helpful in this regard. With that being said she does have a little bit of deep tissue injury on the medial aspect of the wound. Again last time it was more lateral. The lateral seems to have improved quite well. Nonetheless she also notes by way of her son that this has been very macerated in the past several weeks since I last saw her. Though things overall seem to be doing better I still think that the Wound VAC may help with managing her fluid better I'm wondering if we can attempt a gauze Wound VAC my hope is that this will be able to manage the moisture control as well as continuing with managing the odor of the wound and bacterial load since the Dakin s has done excellent in helping in that regard. 04/15/17 on evaluation today patient actually appears to be doing much better in regard to the overall appearance of her sacral wound. I do believe that the gauze Vac has been of benefit for her. Overall she is tolerating this without complication which is also good news. There is no sign of injury to the sacral region at this point. 04/29/18 on evaluation today patient appears to be doing well in regard to her sacral ulcer. She sure signs of good granulation and the Gauze VAC is doing very well for her. Overall I'm pleased in this regard. The biggest issue is that the time from Friday till Monday seems to be quite long for her as far as the amount of time to keep the Wound VAC sealed. For that reason her son wonders if he can take this off on Sunday morning in order to give her day of just wet to dry dressing's with the Dakin's solution I think this would be okay. 05/13/18 on evaluation today patient appears to be doing okay in regard to her sacral ulcer. There does not appear to be any evidence of overt infection at this time which is good news. In general the drainage and  moisture appears to be significantly improved this was after the pressure on the Wound VAC was increased after they contacted our office. With that being said a lot of the dark tissue around the edge of the wound has improved in the maceration is significantly better this seems to be managing much better. Fortunately there is no signs of infection at this time. Overall the patient is doing quite well. The wound measurements are not significantly smaller today although they were over the past two visits we will see how things go. 05/27/18 on evaluation today patient's sacral wound actually appears to be doing fairly well at this point. She does have a deep tissue injury yet again on the medial aspect of the wound which is something that we seem to be seeing often. There is some question as to whether or not this is actually being caused by the Wound VAC bunching up  and then her somewhat laying on it even though they are attempting to offload this as best as possible. Patient son states that overall they do the best they can trying offload her and I definitely believe him. I may want to see about putting the Wound VAC on hold for a couple weeks and see if this happens just with standard dressing changes. 06/10/18 on evaluation today patient actually appears to be doing better in regard to her sacral wound. Overall there's no signs of infection, no odor, and no significant skin breakdown around the edge of the wound I feel like this is done much better 50 Dakin s soaked gauze dressing as opposed to legalizing anything such as the Wound VAC. I believe that we discontinue the Wound VAC as of today. 06/24/18 on evaluation today patient actually appears to be doing well in regard to her sacral ulcer. There's no signs of infection, no odor, and in my pinion no concerns at all. I feel like she is making good progress as far as I'm concerned. 07/12/18 on evaluation today patient appears to be doing very well in  regard to her sacral ulcer. She has been tolerating the dressing changes without complication. Fortunately there does not appear to be any evidence of active infection at this time she is doing excellent as far as I'm concerned at this point. 08/11/18 on evaluation today patient's sacral wound actually appears to be doing quite well at this point. The main issue I see is that she continues to have some maceration and skin breakdown due to moisture over the right periwound location unfortunately. We've investigated the possibility of using zinc oxide before but have not utilized it simply due to the fact that she continues to state. Her son anyways that she is allergic to this. They have not tried it recently and may be completely unrelated to the reaction she had before. Nonetheless this may be something you want to attempt will discuss that in the plan. 10/04/18 on evaluation today patient actually appears to be doing quite well in regard to her sacral wound. It's been roughly 2 months since I last seen her in that time her wound has definitely made progress. This is very slow but again nonetheless does seem to be trending in the correct direction. Overall very pleased in this regard. No fevers, chills, nausea, or vomiting noted at this time. 11/07/2018 on evaluation today patient appears to be doing decently well with regard to her sacral wound based on what I am seeing at this point. She is seen by way of a telehealth visit due to the COVID-19 national emergency and to be honest the patient is somewhat reluctant to come out to be seen for visits which I completely understand. They feel much safer keeping her home as much as possible due to her adequate condition in general. The only issue that she has had recently she did have some wheezing and subsequently chest congestion she was just placed on albuterol along with prednisone at this point. She seems to be doing much better even after 24 hours of  the prednisone according to her [name]. Overall they are pleased with how things seem to be progressing. 11/28/2018 upon evaluation today patient is seen by way of telehealth visit since she is still somewhat reluctant to come out due to the COVID-19 pandemic. Subsequently she seems to be doing well and infected I could not obtain actual measurements for the wound the overall appearance of the wound  is dramatically improved. She does not appear to have nearly as much space at the 12:00 location and in fact her [name] who is taking care of her mainly states that he is barely able to get anything up into that region is filled and so nicely. Overall I feel like she is making great progress. 12/12/2018 patient is seen today via a telehealth visit again secondary to her concerns about coming out during the COVID-19 national pandemic. Nonetheless she appears to be doing quite well at this time which is good news. There is no signs of active infection at this time. Overall patient seems to be very pleased as is her son who takes very good care of her. 01/16/2019 on evaluation today patient actually appears to be doing very well with regard to her wound. We have been doing zoom calls quite a bit since the COVID-19 pandemic began just due to the fact that she has been somewhat concerned about coming out. With that being said she seems to have done very well and in fact the wound is measuring quite a bit smaller today compared to the last measurement. Fortunately there EMMAGENE, ORTNER. ([id number]) is no signs of active infection at this time. She is also having great improvement overall in her demeanor she is very upbeat and pleased with how things have progressed. 02/21/2019 upon evaluation today patient actually appears to be doing very well with regard to her wound. She has been tolerating the dressing changes her sons been performing these without complication and seems to do an excellent job in my  opinion. I am actually very pleased with the overall appearance. The wound is significantly smaller compared to what its been in the past by quite a bit. 03/21/19 on evaluation today patient appears to be doing excellent in regard to her wound. She's been tolerating the dressing changes without complication and there does not appear to be evidence of active infection which is good news. She is still tolerating the Dakin s Moistened gauze packing without complication. 04/18/19 on evaluation today patient is seen by way of a telehealth visit due to her concerns of being exposed to Covid-19 Virus if she comes into the clinic. Subsequently her son is present to help with the visit today. They have been performing the dressing changes specifically Dakins soloution at this point in order to help with granulation in the base of the wound. Fortunately there is no evidence of active infection currently. Her son does note that he feels like maybe some bone in the base of the wound centrally that he is somewhat concerned about. With that being said it's not entirely visible but he states that he can palpate it. 05/25/2019 upon evaluation today patient appears to be doing very well in fact in regard to her wounds. I am very pleased with how things seem to be progressing and though she is not showing any signs of infection I do believe that it may be time to switch the dressing to something a little different I think she has a lot of irritation in the periwound and this is likely coming some from the fact that she does have a lot of moisture. I think the wound is small enough we can see about initiation of a silver alginate dressing. I think this will help keep the periwound from breaking down. Overall however I am very pleased with how things stand. 06/15/2019 upon evaluation today patient appears to be doing very well with regard to her  wound. Overall the periwound also seems to be doing much better. She does have  several filament-like projections of her skin on the periwound region that been present for some time unfortunately they keep getting pulled and stuck to the dressing causing this area to bleed. I think we are getting need to shave/trim these down today to help her to more fully heal in this periwound location so this would not continue to be an issue for her. 07/06/2019 upon evaluation today patient actually appears to be doing excellent in regard to her wound. She is showing signs of improvement the wound is measuring smaller and is looking very well. In general I am extremely happy. The areas that we trimmed off and periwound where she had skin tags last week actually seem to have done excellent they never even bled again. 08/15/2019 upon evaluation today patient appears to actually be doing better with regard to her actual wound. Unfortunately the periwound does not appear to be doing quite as well at this time unfortunately. She had pneumonia over the past couple weeks that is why she did not make it into her appointment at the last visit. Subsequently she because of having pneumonia was not able to lay flat or even on her side as she could not breathe well therefore she spent a lot more time sitting up even sleeping sitting up as opposed to laying on her side. I think this is made some trouble for her as far as the periwound of her sacral wound is concerned and that is why she is showing signs of the irritation of the skin around. Nonetheless I think this is something that can be managed at this point she now feels better than pneumonia has cleared and overall I believe that she is a much better place in that regard to be able to offload. 10/19/2019 upon evaluation today patient appears to be doing well with regard to her wound overall although the periwound unfortunately does have some breakdown today that has been somewhat concerned. Mainly I am concerned about the fact that again previously this  was in very good shape now it appears to be very dry and not well protected despite the use of the same. We may want to try something different such as a AandE ointment or something of like to see if that can be beneficial for the patient. I really feel like the alginate is a good option although I think we need to make sure this is being packed into the wound bed apparently that was not the way the patient's son was doing this previously which I thought he had been to be honest. Nonetheless we will address that today and make appropriate recommendations. 11/09/2019 on evaluation today patient appears to be doing a little more poorly in regard to her wounds currently. There is definitely some signs of pressure injury at this point which does indicate the patient seems to be on her back more than she really should be to be honest. Fortunately there is no signs of active infection at this time which is great news overall I am pleased in that regard. Nonetheless I do believe we need to switch back to the Dakin's moistened gauze at this point based on what I am seeing today. Objective Constitutional Chronically ill appearing but in no apparent acute distress. Vitals Time Taken: 11:04 AM, Height: 70 in, Temperature: 98.1 F, Pulse: 97 bpm, Respiratory Rate: 16 breaths/min, Blood Pressure: 119/67 mmHg. Respiratory normal breathing without difficulty. Psychiatric  this patient is able to make decisions and demonstrates good insight into disease process. Alert and Oriented x 3. pleasant and cooperative. General Notes: Upon inspection patient's wound bed actually showed signs of good granulation and some areas that there was some eschar and skin breakdown and others. Fortunately there is no evidence of active infection at this time systemically or locally but I do believe pressure is still Duard BradyOVERMAN, Carlyn W. ([id number]) a significant issue here. I think they need to be very cautious on exactly what is  going on and how to best eliminate the pressure if we can see any chance of getting this to heal as we wanted to. Integumentary (Hair, Skin) Wound #1 status is Open. Original cause of wound was Pressure Injury. The wound is located on the Medial Sacrum. The wound measures 7.4cm length x 6.2cm width x 2cm depth; 36.034cm^2 area and 72.068cm^3 volume. There is muscle and Fat Layer (Subcutaneous Tissue) Exposed exposed. There is no tunneling noted, however, there is undermining starting at 9:00 and ending at 1:00 with a maximum distance of 4cm. There is a large amount of serosanguineous drainage noted. The wound margin is distinct with the outline attached to the wound base. There is medium (34-66%) red, pink granulation within the wound bed. There is a medium (34-66%) amount of necrotic tissue within the wound bed including Adherent Slough. Wound #7 status is Open. Original cause of wound was Pressure Injury. The wound is located on the Right,Midline Sacrum. The wound measures 4.9cm length x 3.9cm width x 0.4cm depth; 15.009cm^2 area and 6.004cm^3 volume. There is Fat Layer (Subcutaneous Tissue) Exposed exposed. There is no tunneling or undermining noted. There is a medium amount of serous drainage noted. The wound margin is flat and intact. There is small (1-33%) red granulation within the wound bed. There is a large (67-100%) amount of necrotic tissue within the wound bed including Eschar and Adherent Slough. Assessment Active Problems ICD-10 Pressure ulcer of sacral region, stage 4 Spinal stenosis, site unspecified Age-related physical debility Chronic pain syndrome Mixed hyperlipidemia Pressure ulcer of unspecified site, stage 3 Procedures Wound #7 Pre-procedure diagnosis of Wound #7 is a Pressure Ulcer located on the Right,Midline Sacrum . There was a Excisional Skin/Subcutaneous Tissue Debridement with a total area of 19.11 sq cm performed by STONE III, Kemper Hochman E., PA-C. With the following  instrument(s): Curette to remove Viable and Non-Viable tissue/material. Material removed includes Eschar, Subcutaneous Tissue, and Slough after achieving pain control using Lidocaine. No specimens were taken. A time out was conducted at 11:34, prior to the start of the procedure. A Moderate amount of bleeding was controlled with Pressure. The procedure was tolerated well. Post Debridement Measurements: 4.9cm length x 3.9cm width x 0.5cm depth; 7.504cm^3 volume. Post debridement Stage noted as Category/Stage II. Character of Wound/Ulcer Post Debridement is stable. Post procedure Diagnosis Wound #7: Same as Pre-Procedure Plan Wound Cleansing: Wound #1 Medial Sacrum: Other: - please cleanse sacral wound with dakins moistened gauze - do not spray dakins on wound Wound #7 Right,Midline Sacrum: Other: - please cleanse sacral wound with dakins moistened gauze - do not spray dakins on wound Skin Barriers/Peri-Wound Care: Wound #1 Medial Sacrum: Skin Prep Other: - AandD ointment periwound Wound #7 Right,Midline Sacrum: Skin Prep Other: - AandD ointment periwound Primary Wound Dressing: Wound #1 Medial Sacrum: Dakins soaked gauze Wound #7 Right,Midline Sacrum: Dakins soaked gauze Secondary Dressing: Wound #1 Medial SacrumDuard Brady: Crespo, Nitisha W. ([id number]) ABD pad - secure with tape Wound #7 Right,Midline Sacrum: ABD  pad - secure with tape Dressing Change Frequency: Wound #1 Medial Sacrum: Change dressing twice daily. Wound #7 Right,Midline Sacrum: Change dressing twice daily. Follow-up Appointments: Wound #1 Medial Sacrum: Return Appointment in 2 weeks. Wound #7 Right,Midline Sacrum: Return Appointment in 2 weeks. Off-Loading: Wound #1 Medial Sacrum: Turn and reposition every 2 hours Wound #7 Right,Midline Sacrum: Turn and reposition every 2 hours Additional Orders / Instructions: Wound #1 Medial Sacrum: Increase protein intake. Wound #7 Right,Midline Sacrum: Increase protein  intake. Home Health: Wound #1 Medial Sacrum: Continue Home Health Visits - [facility] Nurse may visit PRN to address patient s wound care needs. FACE TO FACE ENCOUNTER: MEDICARE and MEDICAID PATIENTS: I certify that this patient is under my care and that I had a face-to-face encounter that meets the physician face-to-face encounter requirements with this patient on this date. The encounter with the patient was in whole or in part for the following MEDICAL CONDITION: (primary reason for Home Healthcare) MEDICAL NECESSITY: I certify, that based on my findings, NURSING services are a medically necessary home health service. HOME BOUND STATUS: I certify that my clinical findings support that this patient is homebound (i.e., Due to illness or injury, pt requires aid of supportive devices such as crutches, cane, wheelchairs, walkers, the use of special transportation or the assistance of another person to leave their place of residence. There is a normal inability to leave the home and doing so requires considerable and taxing effort. Other absences are for medical reasons / religious services and are infrequent or of short duration when for other reasons). If current dressing causes regression in wound condition, may D/C ordered dressing product/s and apply Normal Saline Moist Dressing daily until next Wound Healing Center / Other MD appointment. Notify Wound Healing Center of regression in wound condition at [phone]. Please direct any NON-WOUND related issues/requests for orders to patient's Primary Care Physician Wound #7 Right,Midline Sacrum: Continue Home Health Visits - [facility] Nurse may visit PRN to address patient s wound care needs. FACE TO FACE ENCOUNTER: MEDICARE and MEDICAID PATIENTS: I certify that this patient is under my care and that I had a face-to-face encounter that meets the physician face-to-face encounter requirements with this patient on this date. The  encounter with the patient was in whole or in part for the following MEDICAL CONDITION: (primary reason for Home Healthcare) MEDICAL NECESSITY: I certify, that based on my findings, NURSING services are a medically necessary home health service. HOME BOUND STATUS: I certify that my clinical findings support that this patient is homebound (i.e., Due to illness or injury, pt requires aid of supportive devices such as crutches, cane, wheelchairs, walkers, the use of special transportation or the assistance of another person to leave their place of residence. There is a normal inability to leave the home and doing so requires considerable and taxing effort. Other absences are for medical reasons / religious services and are infrequent or of short duration when for other reasons). If current dressing causes regression in wound condition, may D/C ordered dressing product/s and apply Normal Saline Moist Dressing daily until next Wound Healing Center / Other MD appointment. Notify Wound Healing Center of regression in wound condition at [phone]. Please direct any NON-WOUND related issues/requests for orders to patient's Primary Care Physician 1. I would recommend currently that we switch back to the Dakin's moistened gauze I think that is probably the best thing to try to help clean this up. 2. I am also can recommend  we continue with AandE ointment around the edges of the wound to protect the skin the skin seems to be a much better shape. 3. I am also can recommend aggressive and appropriate offloading we discussed this in great detail again today she needs to really not be on her back for long periods of time. We will see patient back for reevaluation in 2 weeks here in the clinic. If anything worsens or changes patient will contact our office for additional recommendations. Electronic Signature(s) Signed: 11/09/2019 1:36:25 PM By: Lenda Kelp PA-C Entered By: Lenda Kelp on 11/09/2019  13:36:25 [name] ([id number]) -------------------------------------------------------------------------------- SuperBill Details Patient Name: [name]. Date of Service: 11/09/2019 Medical Record Number: [id number] Patient Account Number: [id number] Date of Birth/Sex: 04-23-1945 (74 y.o. F) Treating RN: Huel Coventry Primary Care Provider: Annita Brod Other Clinician: Referring Provider: Annita Brod Treating Provider/Extender: Linwood Dibbles, Aedan Geimer Weeks in Treatment: 128 Diagnosis Coding ICD-10 Codes Code Description L89.154 Pressure ulcer of sacral region, stage 4 M48.00 Spinal stenosis, site unspecified R54 Age-related physical debility G89.4 Chronic pain syndrome E78.2 Mixed hyperlipidemia L89.93 Pressure ulcer of unspecified site, stage 3 Facility Procedures CPT4 Code: 78295621 Description: 11042 - DEB SUBQ TISSUE 20 SQ CM/< Modifier: Quantity: 1 CPT4 Code: Description: ICD-10 Diagnosis Description L89.154 Pressure ulcer of sacral region, stage 4 Modifier: Quantity: Physician Procedures CPT4 Code: 3086578 Description: 11042 - WC PHYS SUBQ TISS 20 SQ CM Modifier: Quantity: 1 CPT4 Code: Description: ICD-10 Diagnosis Description L89.154 Pressure ulcer of sacral region, stage 4 Modifier: Quantity: Electronic Signature(s) Signed: 11/09/2019 1:38:01 PM By: Lenda Kelp PA-C Entered By: Lenda Kelp on 11/09/2019 13:37:58

## 2019-11-10 NOTE — Progress Notes (Addendum)
Jessica [name], NORTHRUP ([id number]) Visit Report for 11/09/2019 Arrival Information Details [name], BEHNE. Date of Service: 11/09/2019 10:45 AM Medical Record Number: [id number] Patient Account Number: [id number] Date of Birth/Sex: [birth year] (74 y.o. F) Treating RN: Huel Coventry Primary Care Marleni Gallardo: Annita Brod Other Clinician: Referring Tamiya Colello: Annita Brod Treating Grae Cannata/Extender: Linwood Dibbles, HOYT Weeks in Treatment: 128 Visit Information History Since Last Visit Has Dressing in Place as Prescribed: Yes Patient Arrived: Stretcher Pain Present Now: No Arrival Time: 10:59 Accompanied By: son Transfer Assistance: Stretcher Patient Identification Verified: No Secondary Verification Process Completed: No Patient Has Alerts: Yes Patient Alerts: ALLERGIC TO ZINC Electronic Signature(s) Signed: 11/09/2019 6:25:55 PM By: Elliot Gurney, BSN, RN, CWS, Kim RN, BSN Entered By: Elliot Gurney, BSN, RN, CWS, Kim on 11/09/2019 11:00:01 Jessica [name] ([id number]) -------------------------------------------------------------------------------- Encounter Discharge Information Details [name]. Date of Service: 11/09/2019 10:45 AM Medical Record Number: [id number] Patient Account Number: [id number] Date of Birth/Sex: 09-Oct-1945 (74 y.o. F) Treating RN: Huel Coventry Primary Care Keirston Saephanh: Annita Brod Other Clinician: Referring Damari Suastegui: Annita Brod Treating Celie Desrochers/Extender: Linwood Dibbles, HOYT Weeks in Treatment: 128 Encounter Discharge Information Items Post Procedure Vitals Discharge Condition: Stable Temperature (F): 98 Ambulatory Status: Stretcher Pulse (bpm): 97 Discharge Destination: Home Respiratory Rate (breaths/min): 18 Transportation: Ambulance Blood Pressure (mmHg): 119/67 Accompanied By: son Schedule Follow-up Appointment: Yes Clinical Summary of Care: Electronic Signature(s) Signed: 11/09/2019 6:25:55 PM By: Elliot Gurney, BSN, RN, CWS, Kim RN,  BSN Entered By: Elliot Gurney, BSN, RN, CWS, Kim on 11/09/2019 11:43:05 Jessica [name] ([id number]) -------------------------------------------------------------------------------- Lower Extremity Assessment Details [name]. Date of Service: 11/09/2019 10:45 AM Medical Record Number: [id number] Patient Account Number: [id number] Date of Birth/Sex: 12-14-1945 (74 y.o. F) Treating RN: Huel Coventry Primary Care Tara Rud: Annita Brod Other Clinician: Referring Luster Hechler: Annita Brod Treating Chue Berkovich/Extender: Lenda Kelp Weeks in Treatment: 128 Electronic Signature(s) Signed: 11/09/2019 6:25:55 PM By: Elliot Gurney, BSN, RN, CWS, Kim RN, BSN Entered By: Elliot Gurney, BSN, RN, CWS, Kim on 11/09/2019 11:22:01 Jessica [name] ([id number]) -------------------------------------------------------------------------------- Multi Wound Chart Details [name]. Date of Service: 11/09/2019 10:45 AM Medical Record Number: [id number] Patient Account Number: [id number] Date of Birth/Sex: 07-02-1945 (74 y.o. F) Treating RN: Huel Coventry Primary Care Javaeh Muscatello: Annita Brod Other Clinician: Referring Avalynn Bowe: Annita Brod Treating Elsi Stelzer/Extender: STONE III, HOYT Weeks in Treatment: 128 Vital Signs Height(in): 70 Pulse(bpm): 97 Weight(lbs): Blood Pressure(mmHg): 119/67 Body Mass Index(BMI): Temperature(F): 98.1 Respiratory Rate(breaths/min): 16 Photos: [N/A:N/A] Wound Location: Medial Sacrum Right, Midline Sacrum N/A Wounding Event: Pressure Injury Pressure Injury N/A Primary Etiology: Pressure Ulcer Pressure Ulcer N/A Date Acquired: 01/15/2017 10/12/2019 N/A Weeks of Treatment: 128 3 N/A Wound Status: Open Open N/A Measurements L x W x D (cm) 7.4x6.2x2 4.9x3.9x0.4 N/A Area (cm) : 36.034 15.009 N/A Volume (cm) : 72.068 6.004 N/A % Reduction in Area: 2.20% -91.10% N/A % Reduction in Volume: -95.60% -282.20% N/A Starting Position 1 (o'clock): 9 Ending  Position 1 (o'clock): 1 Maximum Distance 1 (cm): 4 Undermining: Yes No N/A Classification: Category/Stage IV Category/Stage II N/A Exudate Amount: Large Medium N/A Exudate Type: Serosanguineous Serous N/A Exudate Color: red, brown amber N/A Wound Margin: Distinct, outline attached Flat and Intact N/A Granulation Amount: Medium (34-66%) Small (1-33%) N/A Granulation Quality: Red, Pink Red N/A Necrotic Amount: Medium (34-66%) Large (67-100%) N/A Necrotic Tissue: Adherent Slough Eschar, Adherent Slough N/A Exposed Structures: Fat Layer (Subcutaneous Tissue): Fat Layer (Subcutaneous Tissue): N/A Yes Yes Muscle: Yes Fascia: No Fascia: No Tendon: No Tendon: No Muscle: No Joint:  No Joint: No Bone: No Bone: No Epithelialization: Small (1-33%) None N/A Treatment Notes Electronic Signature(s) Signed: 11/09/2019 6:25:55 PM By: Elliot Gurney, BSN, RN, CWS, Kim RN, BSN Entered By: Elliot Gurney, BSN, RN, CWS, Kim on 11/09/2019 11:33:23 Jessica [name] ([id number] [name] ([id number]) -------------------------------------------------------------------------------- Multi-Disciplinary Care Plan Details Patient Name: BEILA, PURDIE. Date of Service: 11/09/2019 10:45 AM Medical Record Number: [id number] Patient Account Number: [id number] Date of Birth/Sex: 1945-10-15 (74 y.o. F) Treating RN: Huel Coventry Primary Care Desmin Daleo: Annita Brod Other Clinician: Referring Jaylon Boylen: Annita Brod Treating Asani Mcburney/Extender: Lenda Kelp Weeks in Treatment: 128 Active Inactive Electronic Signature(s) Signed: 12/12/2019 8:25:15 AM By: Elliot Gurney, BSN, RN, CWS, Kim RN, BSN Previous Signature: 12/12/2019 8:22:48 AM Version By: Elliot Gurney, BSN, RN, CWS, Kim RN, BSN Previous Signature: 11/09/2019 6:25:55 PM Version By: Elliot Gurney, BSN, RN, CWS, Kim RN, BSN Entered By: Elliot Gurney, BSN, RN, CWS, Kim on 12/12/2019 08:25:14 Jessica [name]  ([id number]) -------------------------------------------------------------------------------- Pain Assessment Details [name], STRAUSSER. Date of Service: 11/09/2019 10:45 AM Medical Record Number: [id number] Patient Account Number: [id number] Date of Birth/Sex: 11-Jan-1946 (74 y.o. F) Treating RN: Huel Coventry Primary Care Shaundra Fullam: Annita Brod Other Clinician: Referring Sakoya Win: Annita Brod Treating Clovis Warwick/Extender: Linwood Dibbles, HOYT Weeks in Treatment: 128 Active Problems Location of Pain Severity and Description of Pain Patient Has Paino No Site Locations Pain Management and Medication Current Pain Management: Electronic Signature(s) Signed: 11/09/2019 6:25:55 PM By: Elliot Gurney, BSN, RN, CWS, Kim RN, BSN Entered By: Elliot Gurney, BSN, RN, CWS, Kim on 11/09/2019 11:04:46 Jessica [name] ([id number]) -------------------------------------------------------------------------------- Patient/Caregiver Education Details [name]. Date of Service: 11/09/2019 10:45 AM Medical Record Number: [id number] Patient Account Number: [id number] Date of Birth/Gender: 06-29-1945 (74 y.o. F) Treating RN: Huel Coventry Primary Care Physician: Annita Brod Other Clinician: Referring Physician: Annita Brod Treating Physician/Extender: Skeet Simmer in Treatment: 128 Education Assessment Education Provided To: Patient Education Topics Provided Wound/Skin Impairment: Handouts: Caring for Your Ulcer, Other: wound care as prescribed Methods: Demonstration, Explain/Verbal Responses: State content correctly Electronic Signature(s) Signed: 11/09/2019 6:25:55 PM By: Elliot Gurney, BSN, RN, CWS, Kim RN, BSN Entered By: Elliot Gurney, BSN, RN, CWS, Kim on 11/09/2019 11:40:58 Jessica [name] ([id number]) -------------------------------------------------------------------------------- Wound Assessment Details [name]. Date of Service: 11/09/2019 10:45  AM Medical Record Number: [id number] Patient Account Number: [id number] Date of Birth/Sex: October 22, 1945 (74 y.o. F) Treating RN: Huel Coventry Primary Care Helane Briceno: Annita Brod Other Clinician: Referring Mariacristina Aday: Annita Brod Treating Heriberto Stmartin/Extender: STONE III, HOYT Weeks in Treatment: 128 Wound Status Wound Number: 1 Primary Etiology: Pressure Ulcer Wound Location: Medial Sacrum Wound Status: Open Wounding Event: Pressure Injury Date Acquired: 01/15/2017 Weeks Of Treatment: 128 Clustered Wound: No Photos Wound Measurements Length: (cm) 7.4 Width: (cm) 6.2 Depth: (cm) 2 Area: (cm) 36.034 Volume: (cm) 72.068 % Reduction in Area: 2.2% % Reduction in Volume: -95.6% Epithelialization: Small (1-33%) Tunneling: No Undermining: Yes Starting Position (o'clock): 9 Ending Position (o'clock): 1 Maximum Distance: (cm) 4 Wound Description Classification: Category/Stage IV Wound Margin: Distinct, outline attached Exudate Amount: Large Exudate Type: Serosanguineous Exudate Color: red, brown Foul Odor After Cleansing: No Slough/Fibrino Yes Wound Bed Granulation Amount: Medium (34-66%) Exposed Structure Granulation Quality: Red, Pink Fascia Exposed: No Necrotic Amount: Medium (34-66%) Fat Layer (Subcutaneous Tissue) Exposed: Yes Necrotic Quality: Adherent Slough Tendon Exposed: No Muscle Exposed: Yes Necrosis of Muscle: No Joint Exposed: No Bone Exposed: No Electronic Signature(s) Signed: 11/09/2019 6:25:55 PM By: Elliot Gurney, BSN, RN, CWS, Kim RN, BSN Entered By: Elliot Gurney, BSN, RN, CWS, Kim  on 11/09/2019 11:23:00 MICKIE, BADDERS ([id number]) Elwyn Reach, Mellody Memos ([id number]) -------------------------------------------------------------------------------- Wound Assessment Details [name], EMRICH. Date of Service: 11/09/2019 10:45 AM Medical Record Number: [id number] Patient Account Number: [id number] Date of Birth/Sex: 1945/06/09 (74 y.o. F) Treating RN: Huel Coventry Primary Care Tariah Transue: Annita Brod Other Clinician: Referring Vitaly Wanat: Annita Brod Treating Kateline Kinkade/Extender: STONE III, HOYT Weeks in Treatment: 128 Wound Status Wound Number: 7 Primary Etiology: Pressure Ulcer Wound Location: Right, Midline Sacrum Wound Status: Open Wounding Event: Pressure Injury Date Acquired: 10/12/2019 Weeks Of Treatment: 3 Clustered Wound: No Photos Wound Measurements Length: (cm) 4.9 Width: (cm) 3.9 Depth: (cm) 0.4 Area: (cm) 15.009 Volume: (cm) 6.004 % Reduction in Area: -91.1% % Reduction in Volume: -282.2% Epithelialization: None Tunneling: No Undermining: No Wound Description Classification: Category/Stage II Wound Margin: Flat and Intact Exudate Amount: Medium Exudate Type: Serous Exudate Color: amber Foul Odor After Cleansing: No Slough/Fibrino Yes Wound Bed Granulation Amount: Small (1-33%) Exposed Structure Granulation Quality: Red Fascia Exposed: No Necrotic Amount: Large (67-100%) Fat Layer (Subcutaneous Tissue) Exposed: Yes Necrotic Quality: Eschar, Adherent Slough Tendon Exposed: No Muscle Exposed: No Joint Exposed: No Bone Exposed: No Electronic Signature(s) Signed: 11/09/2019 6:25:55 PM By: Elliot Gurney, BSN, RN, CWS, Kim RN, BSN Entered By: Elliot Gurney, BSN, RN, CWS, Kim on 11/09/2019 11:23:52 Jessica [name] ([id number]) -------------------------------------------------------------------------------- Vitals Details [name]. Date of Service: 11/09/2019 10:45 AM Medical Record Number: [id number] Patient Account Number: [id number] Date of Birth/Sex: Apr 13, 1946 (74 y.o. F) Treating RN: Huel Coventry Primary Care Monaca Wadas: Annita Brod Other Clinician: Referring Naija Troost: Annita Brod Treating Roy Tokarz/Extender: STONE III, HOYT Weeks in Treatment: 128 Vital Signs Time Taken: 11:04 Temperature (F): 98.1 Height (in): 70 Pulse (bpm): 97 Respiratory Rate (breaths/min): 16 Blood Pressure (mmHg):  119/67 Reference Range: 80 - 120 mg / dl Electronic Signature(s) Signed: 11/09/2019 6:25:55 PM By: Elliot Gurney, BSN, RN, CWS, Kim RN, BSN Entered By: Elliot Gurney, BSN, RN, CWS, Kim on 11/09/2019 11:04:34

## 2019-11-25 ENCOUNTER — Encounter: Payer: Self-pay | Admitting: Internal Medicine

## 2019-11-25 ENCOUNTER — Emergency Department: Payer: Medicare Other

## 2019-11-25 ENCOUNTER — Inpatient Hospital Stay: Payer: Medicare Other

## 2019-11-25 ENCOUNTER — Inpatient Hospital Stay
Admission: EM | Admit: 2019-11-25 | Discharge: 2019-12-13 | DRG: 871 | Disposition: E | Payer: Medicare Other | Attending: Internal Medicine | Admitting: Internal Medicine

## 2019-11-25 DIAGNOSIS — I959 Hypotension, unspecified: Secondary | ICD-10-CM

## 2019-11-25 DIAGNOSIS — Z20822 Contact with and (suspected) exposure to covid-19: Secondary | ICD-10-CM | POA: Diagnosis present

## 2019-11-25 DIAGNOSIS — Z86718 Personal history of other venous thrombosis and embolism: Secondary | ICD-10-CM

## 2019-11-25 DIAGNOSIS — G8220 Paraplegia, unspecified: Secondary | ICD-10-CM | POA: Diagnosis present

## 2019-11-25 DIAGNOSIS — R000 Tachycardia, unspecified: Secondary | ICD-10-CM

## 2019-11-25 DIAGNOSIS — J9621 Acute and chronic respiratory failure with hypoxia: Secondary | ICD-10-CM

## 2019-11-25 DIAGNOSIS — R197 Diarrhea, unspecified: Secondary | ICD-10-CM | POA: Diagnosis not present

## 2019-11-25 DIAGNOSIS — R0902 Hypoxemia: Secondary | ICD-10-CM

## 2019-11-25 DIAGNOSIS — I5031 Acute diastolic (congestive) heart failure: Secondary | ICD-10-CM | POA: Diagnosis not present

## 2019-11-25 DIAGNOSIS — R6521 Severe sepsis with septic shock: Secondary | ICD-10-CM | POA: Diagnosis present

## 2019-11-25 DIAGNOSIS — Z87891 Personal history of nicotine dependence: Secondary | ICD-10-CM

## 2019-11-25 DIAGNOSIS — Z6841 Body Mass Index (BMI) 40.0 and over, adult: Secondary | ICD-10-CM

## 2019-11-25 DIAGNOSIS — R1310 Dysphagia, unspecified: Secondary | ICD-10-CM | POA: Diagnosis present

## 2019-11-25 DIAGNOSIS — M4800 Spinal stenosis, site unspecified: Secondary | ICD-10-CM | POA: Diagnosis present

## 2019-11-25 DIAGNOSIS — R5383 Other fatigue: Secondary | ICD-10-CM | POA: Diagnosis not present

## 2019-11-25 DIAGNOSIS — B9689 Other specified bacterial agents as the cause of diseases classified elsewhere: Secondary | ICD-10-CM | POA: Diagnosis present

## 2019-11-25 DIAGNOSIS — I5033 Acute on chronic diastolic (congestive) heart failure: Secondary | ICD-10-CM | POA: Diagnosis present

## 2019-11-25 DIAGNOSIS — L89892 Pressure ulcer of other site, stage 2: Secondary | ICD-10-CM | POA: Diagnosis present

## 2019-11-25 DIAGNOSIS — L89304 Pressure ulcer of unspecified buttock, stage 4: Secondary | ICD-10-CM | POA: Diagnosis present

## 2019-11-25 DIAGNOSIS — J9811 Atelectasis: Secondary | ICD-10-CM | POA: Diagnosis not present

## 2019-11-25 DIAGNOSIS — Z515 Encounter for palliative care: Secondary | ICD-10-CM

## 2019-11-25 DIAGNOSIS — G9341 Metabolic encephalopathy: Secondary | ICD-10-CM | POA: Diagnosis present

## 2019-11-25 DIAGNOSIS — R531 Weakness: Secondary | ICD-10-CM | POA: Diagnosis not present

## 2019-11-25 DIAGNOSIS — R0602 Shortness of breath: Secondary | ICD-10-CM

## 2019-11-25 DIAGNOSIS — E43 Unspecified severe protein-calorie malnutrition: Secondary | ICD-10-CM | POA: Diagnosis present

## 2019-11-25 DIAGNOSIS — L89154 Pressure ulcer of sacral region, stage 4: Secondary | ICD-10-CM | POA: Diagnosis present

## 2019-11-25 DIAGNOSIS — R059 Cough, unspecified: Secondary | ICD-10-CM

## 2019-11-25 DIAGNOSIS — I110 Hypertensive heart disease with heart failure: Secondary | ICD-10-CM | POA: Diagnosis present

## 2019-11-25 DIAGNOSIS — N390 Urinary tract infection, site not specified: Secondary | ICD-10-CM | POA: Diagnosis present

## 2019-11-25 DIAGNOSIS — A4159 Other Gram-negative sepsis: Principal | ICD-10-CM

## 2019-11-25 DIAGNOSIS — E785 Hyperlipidemia, unspecified: Secondary | ICD-10-CM | POA: Diagnosis present

## 2019-11-25 DIAGNOSIS — Z66 Do not resuscitate: Secondary | ICD-10-CM | POA: Diagnosis not present

## 2019-11-25 DIAGNOSIS — R627 Adult failure to thrive: Secondary | ICD-10-CM

## 2019-11-25 DIAGNOSIS — R112 Nausea with vomiting, unspecified: Secondary | ICD-10-CM | POA: Diagnosis not present

## 2019-11-25 DIAGNOSIS — R4182 Altered mental status, unspecified: Secondary | ICD-10-CM | POA: Diagnosis present

## 2019-11-25 DIAGNOSIS — A419 Sepsis, unspecified organism: Secondary | ICD-10-CM | POA: Diagnosis present

## 2019-11-25 DIAGNOSIS — Z79899 Other long term (current) drug therapy: Secondary | ICD-10-CM

## 2019-11-25 DIAGNOSIS — Z7401 Bed confinement status: Secondary | ICD-10-CM

## 2019-11-25 DIAGNOSIS — R7881 Bacteremia: Secondary | ICD-10-CM | POA: Diagnosis not present

## 2019-11-25 DIAGNOSIS — E039 Hypothyroidism, unspecified: Secondary | ICD-10-CM | POA: Diagnosis present

## 2019-11-25 DIAGNOSIS — Z888 Allergy status to other drugs, medicaments and biological substances status: Secondary | ICD-10-CM

## 2019-11-25 DIAGNOSIS — Z7189 Other specified counseling: Secondary | ICD-10-CM

## 2019-11-25 DIAGNOSIS — J9601 Acute respiratory failure with hypoxia: Secondary | ICD-10-CM | POA: Diagnosis not present

## 2019-11-25 DIAGNOSIS — B964 Proteus (mirabilis) (morganii) as the cause of diseases classified elsewhere: Secondary | ICD-10-CM | POA: Diagnosis present

## 2019-11-25 DIAGNOSIS — J90 Pleural effusion, not elsewhere classified: Secondary | ICD-10-CM | POA: Diagnosis present

## 2019-11-25 DIAGNOSIS — A414 Sepsis due to anaerobes: Secondary | ICD-10-CM

## 2019-11-25 DIAGNOSIS — E162 Hypoglycemia, unspecified: Secondary | ICD-10-CM | POA: Diagnosis not present

## 2019-11-25 DIAGNOSIS — R6520 Severe sepsis without septic shock: Secondary | ICD-10-CM

## 2019-11-25 DIAGNOSIS — L8990 Pressure ulcer of unspecified site, unspecified stage: Secondary | ICD-10-CM

## 2019-11-25 DIAGNOSIS — Z79890 Hormone replacement therapy (postmenopausal): Secondary | ICD-10-CM

## 2019-11-25 DIAGNOSIS — Z7982 Long term (current) use of aspirin: Secondary | ICD-10-CM

## 2019-11-25 DIAGNOSIS — B961 Klebsiella pneumoniae [K. pneumoniae] as the cause of diseases classified elsewhere: Secondary | ICD-10-CM | POA: Diagnosis not present

## 2019-11-25 DIAGNOSIS — R7989 Other specified abnormal findings of blood chemistry: Secondary | ICD-10-CM

## 2019-11-25 DIAGNOSIS — Z8249 Family history of ischemic heart disease and other diseases of the circulatory system: Secondary | ICD-10-CM

## 2019-11-25 DIAGNOSIS — J189 Pneumonia, unspecified organism: Secondary | ICD-10-CM | POA: Diagnosis present

## 2019-11-25 LAB — COMPREHENSIVE METABOLIC PANEL
ALT: 42 U/L (ref 0–44)
AST: 123 U/L — ABNORMAL HIGH (ref 15–41)
Albumin: 1.2 g/dL — ABNORMAL LOW (ref 3.5–5.0)
Alkaline Phosphatase: 157 U/L — ABNORMAL HIGH (ref 38–126)
Anion gap: 9 (ref 5–15)
BUN: 9 mg/dL (ref 8–23)
CO2: 24 mmol/L (ref 22–32)
Calcium: 6.9 mg/dL — ABNORMAL LOW (ref 8.9–10.3)
Chloride: 101 mmol/L (ref 98–111)
Creatinine, Ser: 0.77 mg/dL (ref 0.44–1.00)
GFR calc Af Amer: >60 mL/min (ref >60)
GFR calc non Af Amer: >60 mL/min (ref >60)
Glucose, Bld: 99 mg/dL (ref 70–99)
Potassium: 3.7 mmol/L (ref 3.5–5.1)
Sodium: 134 mmol/L — ABNORMAL LOW (ref 135–145)
Total Bilirubin: 2.3 mg/dL — ABNORMAL HIGH (ref 0.3–1.2)
Total Protein: 4.1 g/dL — ABNORMAL LOW (ref 6.5–8.1)

## 2019-11-25 LAB — BLOOD GAS, ARTERIAL
Acid-Base Excess: 0.6 mmol/L (ref 0.0–2.0)
Allens test (pass/fail): POSITIVE — AB
Bicarbonate: 26 mmol/L (ref 20.0–28.0)
FIO2: 32
O2 Saturation: 95.8 %
Patient temperature: 37
pCO2 arterial: 44 mmHg (ref 32.0–48.0)
pH, Arterial: 7.38 (ref 7.350–7.450)
pO2, Arterial: 82 mmHg — ABNORMAL LOW (ref 83.0–108.0)

## 2019-11-25 LAB — CBC WITH DIFFERENTIAL/PLATELET
Abs Immature Granulocytes: 0.27 10*3/uL — ABNORMAL HIGH (ref 0.00–0.07)
Basophils Absolute: 0 10*3/uL (ref 0.0–0.1)
Basophils Relative: 0 %
Eosinophils Absolute: 0 10*3/uL (ref 0.0–0.5)
Eosinophils Relative: 0 %
HCT: 25.5 % — ABNORMAL LOW (ref 36.0–46.0)
Hemoglobin: 8.8 g/dL — ABNORMAL LOW (ref 12.0–15.0)
Immature Granulocytes: 1 %
Lymphocytes Relative: 6 %
Lymphs Abs: 1.7 10*3/uL (ref 0.7–4.0)
MCH: 31.4 pg (ref 26.0–34.0)
MCHC: 34.5 g/dL (ref 30.0–36.0)
MCV: 91.1 fL (ref 80.0–100.0)
Monocytes Absolute: 1.1 10*3/uL — ABNORMAL HIGH (ref 0.1–1.0)
Monocytes Relative: 4 %
Neutro Abs: 25.1 10*3/uL — ABNORMAL HIGH (ref 1.7–7.7)
Neutrophils Relative %: 89 %
Platelets: 229 10*3/uL (ref 150–400)
RBC: 2.8 MIL/uL — ABNORMAL LOW (ref 3.87–5.11)
RDW: 15.5 % (ref 11.5–15.5)
WBC: 28.2 10*3/uL — ABNORMAL HIGH (ref 4.0–10.5)
nRBC: 0 % (ref 0.0–0.2)

## 2019-11-25 LAB — URINALYSIS, COMPLETE (UACMP) WITH MICROSCOPIC
Bilirubin Urine: NEGATIVE
Glucose, UA: NEGATIVE mg/dL
Hgb urine dipstick: NEGATIVE
Ketones, ur: NEGATIVE mg/dL
Nitrite: POSITIVE — AB
Protein, ur: NEGATIVE mg/dL
Specific Gravity, Urine: 1.008 (ref 1.005–1.030)
pH: 6 (ref 5.0–8.0)

## 2019-11-25 LAB — T4, FREE: Free T4: 1.18 ng/dL — ABNORMAL HIGH (ref 0.61–1.12)

## 2019-11-25 LAB — APTT: aPTT: 48 seconds — ABNORMAL HIGH (ref 24–36)

## 2019-11-25 LAB — SARS CORONAVIRUS 2 BY RT PCR (HOSPITAL ORDER, PERFORMED IN ~~LOC~~ HOSPITAL LAB): SARS Coronavirus 2: NEGATIVE

## 2019-11-25 LAB — PROTIME-INR
INR: 2 — ABNORMAL HIGH (ref 0.8–1.2)
Prothrombin Time: 22.2 seconds — ABNORMAL HIGH (ref 11.4–15.2)

## 2019-11-25 LAB — LACTIC ACID, PLASMA
Lactic Acid, Venous: 3.4 mmol/L (ref 0.5–1.9)
Lactic Acid, Venous: 2.8 mmol/L (ref 0.5–1.9)
Lactic Acid, Venous: 2 mmol/L (ref 0.5–1.9)

## 2019-11-25 MED ORDER — SODIUM CHLORIDE 0.9 % IV BOLUS
1000.0000 mL | Freq: Once | INTRAVENOUS | Status: AC
  Administered 2019-11-25: 1000 mL via INTRAVENOUS

## 2019-11-25 MED ORDER — SODIUM CHLORIDE 0.9 % IV SOLN
1.0000 g | INTRAVENOUS | Status: DC
  Administered 2019-11-25: 1 g via INTRAVENOUS
  Filled 2019-11-25: qty 10

## 2019-11-25 MED ORDER — NOREPINEPHRINE 4 MG/250ML-% IV SOLN
0.0000 ug/min | INTRAVENOUS | Status: DC
  Administered 2019-11-25 – 2019-11-27 (×3): 2 ug/min via INTRAVENOUS
  Filled 2019-11-25 (×2): qty 250

## 2019-11-25 MED ORDER — SODIUM CHLORIDE 0.9 % IV SOLN
1.0000 g | Freq: Once | INTRAVENOUS | Status: AC
  Administered 2019-11-25: 1 g via INTRAVENOUS
  Filled 2019-11-25: qty 1

## 2019-11-25 MED ORDER — VANCOMYCIN HCL 2000 MG/400ML IV SOLN
2000.0000 mg | Freq: Once | INTRAVENOUS | Status: AC
  Administered 2019-11-25: 2000 mg via INTRAVENOUS
  Filled 2019-11-25: qty 400

## 2019-11-25 MED ORDER — IOHEXOL 300 MG/ML  SOLN
100.0000 mL | Freq: Once | INTRAMUSCULAR | Status: AC | PRN
  Administered 2019-11-25: 100 mL via INTRAVENOUS

## 2019-11-25 MED ORDER — LACTATED RINGERS IV SOLN: INTRAVENOUS | Status: DC

## 2019-11-25 MED ORDER — DOCUSATE SODIUM 100 MG PO CAPS: 100.0000 mg | ORAL_CAPSULE | Freq: Two times a day (BID) | ORAL | Status: DC | PRN

## 2019-11-25 MED ORDER — ALBUMIN HUMAN 25 % IV SOLN
50.0000 g | Freq: Once | INTRAVENOUS | Status: AC
  Administered 2019-11-25: 50 g via INTRAVENOUS
  Filled 2019-11-25: qty 200

## 2019-11-25 MED ORDER — POLYETHYLENE GLYCOL 3350 17 G PO PACK: 17.0000 g | PACK | Freq: Every day | ORAL | Status: DC | PRN

## 2019-11-25 MED ORDER — FAMOTIDINE IN NACL 20-0.9 MG/50ML-% IV SOLN
20.0000 mg | INTRAVENOUS | Status: DC
  Administered 2019-11-25 – 2019-11-29 (×5): 20 mg via INTRAVENOUS
  Filled 2019-11-25 (×5): qty 50

## 2019-11-25 MED ORDER — VANCOMYCIN HCL 1250 MG/250ML IV SOLN
1250.0000 mg | Freq: Two times a day (BID) | INTRAVENOUS | Status: DC
  Filled 2019-11-25: qty 250

## 2019-11-25 MED ORDER — SODIUM CHLORIDE 0.9 % IV BOLUS
500.0000 mL | Freq: Once | INTRAVENOUS | Status: AC
  Administered 2019-11-25: 500 mL via INTRAVENOUS

## 2019-11-25 NOTE — ED Notes (Signed)
X-ray at bedside

## 2019-11-25 NOTE — ED Notes (Signed)
This RN at bedside. Pt's BP dropping back down to 80s systolic. Pt unresponsive to verbal or painful stimuli. COVID swab obtained and pt remained unresponsive. Pulses present upon palpation. Levophed titrated per order protocol. MD made aware.

## 2019-11-25 NOTE — ED Notes (Signed)
ICU MD at bedside

## 2019-11-25 NOTE — Progress Notes (Signed)
PCCM to [facility] hospitalist pick up in the morning 11/26/2019   74 year old female with hypertension, history of DVT, chronic diastolic heart failure, hypothyroidism, history of E. coli bacteremia who presented with family concerns of unresponsiveness.  Patient obtundated on arrival and found to be hypotensive with systolic in the 60s requiring Levophed.  Also was hypoxic down to 80% on 2 L and had to be placed on 4 L but no pneumonia seen on CXR.  There was initial concerns for her ability to protect her airway and intensivist had wanted to intubate but patient became more awake and alert so intubation was withheld.  Currently now off of Levophed after receiving IV fluid resuscitation.  Vancomycin was given for suspected sepsis due to sacral ulcers.  Labs notable for leukocytosis of 28.2.  Lactic acid of 3.4 on initial presentation. Negative CT head.  CT abdomen pelvis shows suspected colitis.

## 2019-11-25 NOTE — Consult Note (Signed)
Pharmacy Antibiotic Note  Jessica [name] is a 74 y.o. female admitted on 12-18-2019 with sepsis.  Pharmacy has been consulted for Vancomycin dosing. Patient currently also receiving Rocephin  Plan: Vancomycin 1250mg  Q12 hours per [place] [facility]  Height: 5\' 10"  (177.8 cm) Weight: 97.3 kg (214 lb 8.1 oz) IBW/kg (Calculated) : 68.5  Temp (24hrs), Avg:98.3 F (36.8 C), Min:98.3 F (36.8 C), Max:98.3 F (36.8 C)  Recent Labs  Lab 2019-12-18 1021 12-18-19 1239  WBC 28.2*  --   CREATININE 0.77  --   LATICACIDVEN 3.4* 2.8*    Estimated Creatinine Clearance: 80.3 mL/min (by C-G formula based on SCr of 0.77 mg/dL).    Allergies  Allergen Reactions  . Ambien [Zolpidem Tartrate] Other (See Comments)    Reaction: altered mental status  . Zinc Other (See Comments)    Zinc ointments cause skin irritation    Antimicrobials this admission: Vancomycin 8/14 >>  Ceftriaxone 8/14 >>  Cefepime x 1   Microbiology results: 8/14 BCx: pending 8/14 UCx: pending    Thank you for allowing pharmacy to be a part of this patient's care.  9/14 A Jessica [name] 12-18-19 5:09 PM

## 2019-11-25 NOTE — ED Notes (Signed)
This RN, Vernona Rieger EDT, Dr. Keenan Bachelor RN, [facility] RN, and RT at bedside.

## 2019-11-25 NOTE — ED Notes (Signed)
Family at bedside. 

## 2019-11-25 NOTE — ED Notes (Addendum)
Pt moved into a hospital bed by beth ed tech and alyssa, ed tech. Pt answering questions and talking with staff during repositioning. Head of bed elevated to 30 degrees, booties on bilateral feet. Pt's husband states pt with indwelling foley from home.

## 2019-11-25 NOTE — ED Notes (Signed)
This RN transporting pt to CT at this time. 

## 2019-11-25 NOTE — ED Notes (Signed)
Per MD pt will need to be intubated. Suction, ambu bag, Code cart, RIK kit, glideoscope at bedside.

## 2019-11-25 NOTE — ED Provider Notes (Addendum)
[facility] Emergency Department Provider Note   ____________________________________________   First MD Initiated Contact with Patient 12/06/2019 1023     (approximate)  I have reviewed the triage vital signs and the nursing notes.   HISTORY  Chief Complaint Code Sepsis History limited by patient's unresponsiveness  HPI Jessica [name] is a 74 y.o. female patient comes in via EMS.  They report family said she was much less responsive this morning.  Normally she is awake alert and oriented.  Blood pressure by EMS was I believe they said in the 60s.  Patient has a past history of UTIs.  She has a deep pressure ulcer on her back.  Patient is minimally responsive.  Not speaking occasionally will open her eyes and look around.  Initial O2 sats on 2 L were in the high 80s.  Patient increased to 4 L and sats went up to 97%.         Past Medical History:  Diagnosis Date  . Chronic pain   . Depression   . Diastolic heart failure (HCC)   . HLD (hyperlipidemia)   . HTN (hypertension)   . Hypothyroidism   . Morbid obesity (HCC)   . Recurrent UTI   . Spinal stenosis   . Visual hallucinations     Patient Active Problem List   Diagnosis Date Noted  . AKI (acute kidney injury) (HCC)   . Hypomagnesemia   . Acute on chronic diastolic CHF (congestive heart failure) (HCC) 10/03/2019  . Acute respiratory failure with hypoxia (HCC) 10/03/2019  . Sepsis due to Escherichia coli with acute renal failure and septic shock (HCC) 10/01/2019  . UTI (urinary tract infection) due to urinary indwelling catheter (HCC) 10/01/2019  . Pressure injury of skin 09/30/2019  . Severe sepsis with septic shock (HCC) 09/29/2019  . 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 Weight loss 10/12/2017  . Needs peripherally inserted central catheter (PICC) 10/12/2017  . Subacute delirium 01/20/2017  . Decubitus ulcer of sacral region, stage 4 (HCC) 12/19/2016  . Candiduria, asymptomatic   12/18/2016  . Hallucinations 07/06/2016  . Hypokalemia 07/06/2016  . Sinus tachycardia 07/06/2016  . CONSTIPATION 07/08/2009  . DEEP VENOUS THROMBOPHLEBITIS, BILATERAL 01/07/2009  . Confined to bed 11/22/2008  . 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 HLD (hyperlipidemia) 12/10/2006  . ANXIETY 12/10/2006  . DEPRESSION 12/10/2006  . Essential hypertension 12/10/2006  . OSTEOARTHRITIS 12/10/2006  . Hypothyroidism 09/22/2006  . HYPERCHOLESTEROLEMIA 09/22/2006  . ECZEMA 09/22/2006    Past Surgical History:  Procedure Laterality Date  . ABDOMINAL HYSTERECTOMY    . BACK SURGERY    . HEMORROIDECTOMY    . IR RADIOLOGIST EVAL & MGMT  09/01/2017    Prior to Admission medications   Medication Sig Start Date End Date Taking? Authorizing Provider  acetaminophen (TYLENOL) 325 MG tablet Take 650 mg by mouth every 6 (six) hours as needed for mild pain.    [provider]  albuterol (PROVENTIL HFA;VENTOLIN HFA) 108 (90 Base) MCG/ACT inhaler Inhale 2 puffs into the lungs every 4 (four) hours as needed for wheezing or shortness of breath.    [provider]  albuterol (PROVENTIL) (2.5 MG/3ML) 0.083% nebulizer solution Take 2.5 mg by nebulization every 6 (six) hours as needed for wheezing or shortness of breath.    [provider]  ARIPiprazole (ABILIFY) 2 MG tablet Take 2 mg by mouth daily.  03/14/17   [provider]  aspirin EC 81 MG tablet Take 81 mg by mouth  daily.    [provider]  baclofen 5 MG TABS Take 5 mg by mouth 3 (three) times daily. 10/05/19   Alford Highland, MD  collagenase (SANTYL) ointment Apply topically daily. 02/11/17   Adrian Saran, MD  docusate sodium (COLACE) 100 MG capsule Take 100 mg by mouth 2 (two) times daily.    [provider]  furosemide (LASIX) 20 MG tablet Two tablets daily for one week then can take one tablet daily after that 10/05/19   Alford Highland, MD  hydrocortisone 2.5 % cream Apply topically 2 (two) times  daily.    [provider]  levothyroxine (SYNTHROID, LEVOTHROID) 150 MCG tablet Take 150 mcg by mouth daily before breakfast.    [provider]  metoprolol succinate (TOPROL-XL) 25 MG 24 hr tablet Take 0.5 tablets (12.5 mg total) by mouth daily. 10/05/19   Alford Highland, MD  midodrine (PROAMATINE) 10 MG tablet Take 1 tablet (10 mg total) by mouth 3 (three) times daily with meals. 10/05/19   Alford Highland, MD  nystatin cream (MYCOSTATIN) Apply 1 application topically 2 (two) times daily.    [provider]  ondansetron (ZOFRAN-ODT) 4 MG disintegrating tablet Take 4 mg by mouth every 8 (eight) hours as needed for nausea or vomiting.    [provider]  polyethylene glycol (MIRALAX / GLYCOLAX) packet Take 17 g by mouth daily. Patient not taking: Reported on 10/27/2019    [provider]  potassium chloride (KLOR-CON) 10 MEQ tablet Take 10 mEq by mouth daily.    [provider]  pregabalin (LYRICA) 100 MG capsule Take 100 mg by mouth 3 (three) times daily.     [provider]  sennosides-docusate sodium (SENOKOT-S) 8.6-50 MG tablet Take 1 tablet by mouth daily. Patient not taking: Reported on 10/27/2019    [provider]  traZODone (DESYREL) 50 MG tablet Take 50 mg by mouth at bedtime.    [provider]  triamcinolone cream (KENALOG) 0.1 % Apply topically. 10/20/19   [provider]  Venlafaxine HCl 225 MG TB24 Take 225 mg by mouth daily.    [provider]  vitamin B-12 (CYANOCOBALAMIN) 1000 MCG tablet Take 1,000 mcg by mouth daily.    [provider]  Vitamin D, Ergocalciferol, (DRISDOL) 50000 units CAPS capsule Take 50,000 Units by mouth every 30 (thirty) days.    [provider]  zinc oxide (BALMEX) 11.3 % CREA cream Apply to skin outside of wound Patient not taking: Reported on 10/27/2019 10/05/19   Alford Highland, MD    Allergies Ambien [zolpidem tartrate] and Zinc  Family  History  Problem Relation Age of Onset  . Heart disease Father   . Deep vein thrombosis Father     Social History Social History   Tobacco Use  . Smoking status: Never Smoker  . Smokeless tobacco: Former Neurosurgeon    Types: Snuff  Vaping Use  . Vaping Use: Never used  Substance Use Topics  . Alcohol use: No  . Drug use: No    Review of Systems Unable to obtain ____________________________________________   PHYSICAL EXAM:  VITAL SIGNS: ED Triage Vitals [12/01/2019 1023]  Enc Vitals Group     BP (!) 68/41     Pulse Rate (!) 104     Resp (!) 26     Temp      Temp src      SpO2 100 %     Weight      Height  Head Circumference      Peak Flow      Pain Score      Pain Loc      Pain Edu?      Excl. in GC?     Constitutional: Patient unresponsive except sometimes will open eyes and look around but immediately goes back to sleep again Eyes: Conjunctivae are normal. PER Head: Atraumatic. Nose: No congestion/rhinnorhea. Mouth/Throat: Mucous membranes are moist.  Oropharynx non-erythematous. Neck: No stridor. Cardiovascular: Normal rate, regular rhythm. Grossly normal heart sounds.  Good peripheral circulation. Respiratory: Normal respiratory effort.  No retractions. Lungs CTAB. Gastrointestinal: Soft and nontender. No distention. No abdominal bruits.  Musculoskeletal: No lower extremity tenderness nor edema.   Skin:  Skin is warm, dry there is a large and deep pressure ulcer in the area of the sacrum.  Does not smell good but I do not see any obvious pus or necrotic tissue   ____________________________________________   LABS (all labs ordered are listed, but only abnormal results are displayed)  Labs Reviewed  LACTIC ACID, PLASMA - Abnormal; Notable for the following components:      Result Value   Lactic Acid, Venous 3.4 (*)    All other components within normal limits  COMPREHENSIVE METABOLIC PANEL - Abnormal; Notable for the following components:   Sodium  134 (*)    Calcium 6.9 (*)    Total Protein 4.1 (*)    Albumin 1.2 (*)    AST 123 (*)    Alkaline Phosphatase 157 (*)    Total Bilirubin 2.3 (*)    All other components within normal limits  CBC WITH DIFFERENTIAL/PLATELET - Abnormal; Notable for the following components:   WBC 28.2 (*)    RBC 2.80 (*)    Hemoglobin 8.8 (*)    HCT 25.5 (*)    Neutro Abs 25.1 (*)    Monocytes Absolute 1.1 (*)    Abs Immature Granulocytes 0.27 (*)    All other components within normal limits  CULTURE, BLOOD (ROUTINE X 2)  CULTURE, BLOOD (ROUTINE X 2)  URINE CULTURE  SARS CORONAVIRUS 2 BY RT PCR (HOSPITAL ORDER, PERFORMED IN [facility])  LACTIC ACID, PLASMA  PROTIME-INR  APTT  URINALYSIS, COMPLETE (UACMP) WITH MICROSCOPIC   ____________________________________________  EKG  EKG read interpreted by me shows normal sinus rhythm rate of 99 right bundle branch block left posterior hemiblock similar to prior EKG from June ______________________________________  RADIOLOGY  ED MD interpretation: Chest x-ray read by radiology is stable right-sided effusion.  I reviewed the film.  Official radiology report(s): DG Chest Portable 1 View  Result Date: 11/26/2019 CLINICAL DATA:  Sepsis with altered mental status EXAM: PORTABLE CHEST 1 VIEW COMPARISON:  10/03/2019 FINDINGS: Cardiac shadow is enlarged but stable. Persistent infiltrate in the right base with associated effusion is seen. No new focal infiltrate is noted. No bony abnormality is seen. IMPRESSION: Stable infiltrate in the right base with associated effusion. Electronically Signed   By: Alcide Clever M.D.   On: 12/03/2019 11:08    ____________________________________________   PROCEDURES  Procedure(s) performed (including Critical Care):  Procedures   ____________________________________________   INITIAL IMPRESSION / ASSESSMENT AND PLAN / ED COURSE  ----------------------------------------- 11:16 AM on  12/06/2019 -----------------------------------------  Patient is now awake alert oriented and answering questions well.  Blood pressure has come up to 92 after 1 3/4 L of fluid.  Her white count is 28,000.  Likely she has urinary source again.  She still has not  made any urine though.  We will plan on admitting her with a diagnosis of severe sepsis this would fit her altered mental status severe hypotension elevated white count and fever at home. Patient does require 4 L of oxygen per nasal cannula.  Normally she is not on any oxygen.  Chest x-ray does not show any sign of pneumonia.  Possibly 1 will blossom after she gets enough fluid.  More likely she has a UTI as a source of her sepsis as she has had this before and has an indwelling Foley.      ----------------------------------------- 12:35 PM on 30-Nov-2019 -----------------------------------------  Ultrasound does not show any cause for her sepsis.  Patient blood pressure had dropped again we had to restart the Levophed at low-dose.  She is getting a little bit more fluid.  I will now go ahead and call the hospitalist for admission.       ____________________________________________   FINAL CLINICAL IMPRESSION(S) / ED DIAGNOSES  Final diagnoses:  Severe sepsis (HCC)  Hypotension, unspecified hypotension type  Hypoxia     ED Discharge Orders    None       Note:  This document was prepared using Dragon voice recognition software and may include unintentional dictation errors.    Arnaldo Natal, MD November 30, 2019 1120    Arnaldo Natal, MD 2019/11/30 1236 ----------------------------------------- 1:35 PM on 11/30/2019 -----------------------------------------  Patient's blood pressures up after we increase the Levophed in fact we have been able to titrated down by 1 mic. Blood pressures currently 126/43. Patient is not responding but when I checked her gag reflex she did have a gag reflex. Intensive care so they would be  down to check on her shortly.   Arnaldo Natal, MD 2019-11-30 1336 Intensivist comes by.  Patient does now seem to lose her gag.  He wants me to intubate her I will do this quickly.  Use some ketamine and rocuronium since she has not walked for many years due to her spinal stenosis.  Son had said earlier he wanted [place] to keep her alive he could not stand the thought of losing her.  He also said he did not want her to be hurt. Understand this morning that he wants her to be full code.  Chaplain is talking with him now and intensivist is also talked to him. ----------------------------------------- 2:10 PM on 11/30/2019 -----------------------------------------  Patient rolled over to put pacer pads on preparatory to intubating her and she wakes up and now she is awake alert and talking again.  We will not intubate her.   Arnaldo Natal, MD 11-30-19 [phone]

## 2019-11-25 NOTE — ED Notes (Signed)
Manual BP 72/48

## 2019-11-25 NOTE — ED Notes (Signed)
Pt alert and able to answer yes and no questions

## 2019-11-25 NOTE — ED Triage Notes (Signed)
Pt to ED via [facility] from home. Per EMS family stating pt with new onset AMS this morning. Pt normally A&Ox4.  Pt hypotensive upon arrival. Pt hx UTI and stage 3 pressure injury on back. Pt with indwelling catheter in place. Pt placed on 4L [place] upon arrival. Pt does not use oxygen at home.   EMS VS:  BP 55/42  and repeat 66/54 Etco2 17/-18 99.7 axillary  87% RA  CBG 122

## 2019-11-25 NOTE — ED Notes (Signed)
Pt rolled to left side

## 2019-11-25 NOTE — ED Notes (Signed)
ICU MD, Dr. Josue Hector, updated on pt getting off of levophed drip. Md stating wanting bolus

## 2019-11-25 NOTE — ED Notes (Signed)
This RN requesting 2 visitors be allowed back for pt due to deteriorating condition. Consulting civil engineer agreed. Screener made aware.

## 2019-11-25 NOTE — ED Notes (Signed)
Chaplain at bedside

## 2019-11-25 NOTE — ED Notes (Signed)
Chaplain called for pt's son

## 2019-11-25 NOTE — H&P (Addendum)
Name: Jessica [name] MRN: [id number] DOB: 09-24-1945     CONSULTATION DATE: 12/03/2019   HISTORY OF PRESENT ILLNESS:   74 years old lady with history of multiple UTIs, sacral decub, sacral osteomyelitis, spinal stenosis, hypothyroidism, hypertension, diastolic heart failure, depression, chronic pain and visual hallucinations.  Patient presented to ED because of because of altered mental status and less responsiveness since the morning.  While in the ED she was found to have hypotension with systolic blood pressure low in 70s, improved after fluid resuscitation was however pressure continued to drop requiring titration of Levophed.  Reported by nursing exam earlier in the ED that the patient was awake answering question by nodding her head however by time of critical care evaluation the patient is completely unresponsive even to painful stimulation.  Case was discussed with the ED physician and he agreed to proceed with intubation.  I discussed Jessica [name] condition with her son at the bedside and the requirement to proceed with intubation mechanical ventilation as she did not respond to physical stimulation.  Jessica [name] son was very emotional crying hysterically and we were able to provide any detailed medical history. Jessica [name] arrived later, he was made aware of the patient critical condition and requirement to be on the ventilator.  CODE STATUS was verified to be full code.   PAST MEDICAL HISTORY :   has a past medical history of Chronic pain, Depression, Diastolic heart failure (HCC), HLD (hyperlipidemia), HTN (hypertension), Hypothyroidism, Morbid obesity (HCC), Recurrent UTI, Spinal stenosis, and Visual hallucinations.  has a past surgical history that includes Back surgery; Abdominal hysterectomy; Hemorroidectomy; and IR Radiologist Eval & Mgmt (09/01/2017). Prior to Admission medications   Medication Sig Start Date End Date Taking? Authorizing Provider  albuterol (PROVENTIL  HFA;VENTOLIN HFA) 108 (90 Base) MCG/ACT inhaler Inhale 2 puffs into the lungs every 4 (four) hours as needed for wheezing or shortness of breath.   Yes [provider]  ARIPiprazole (ABILIFY) 2 MG tablet Take 2 mg by mouth daily.  03/14/17  Yes [provider]  aspirin EC 81 MG tablet Take 81 mg by mouth daily.   Yes [provider]  baclofen 5 MG TABS Take 5 mg by mouth 3 (three) times daily. 10/05/19  Yes Alford Highland, MD  furosemide (LASIX) 20 MG tablet Two tablets daily for one week then can take one tablet daily after that 10/05/19  Yes Wieting, Richard, MD  midodrine (PROAMATINE) 10 MG tablet Take 1 tablet (10 mg total) by mouth 3 (three) times daily with meals. 10/05/19  Yes Wieting, Richard, MD  ondansetron (ZOFRAN-ODT) 4 MG disintegrating tablet Take 4 mg by mouth every 8 (eight) hours as needed for nausea or vomiting.   Yes [provider]  traZODone (DESYREL) 50 MG tablet Take 50 mg by mouth at bedtime.   Yes [provider]  Venlafaxine HCl 225 MG TB24 Take 225 mg by mouth daily.   Yes [provider]  acetaminophen (TYLENOL) 325 MG tablet Take 650 mg by mouth every 6 (six) hours as needed for mild pain.    [provider]  levothyroxine (SYNTHROID, LEVOTHROID) 150 MCG tablet Take 150 mcg by mouth daily before breakfast.    [provider]  potassium chloride (KLOR-CON) 10 MEQ tablet Take 10 mEq by mouth daily.    [provider]  pregabalin (LYRICA) 100 MG capsule Take 100 mg by mouth 3 (three) times daily.     [provider]  vitamin B-12 (CYANOCOBALAMIN)  1000 MCG tablet Take 1,000 mcg by mouth daily.    [provider]   Allergies  Allergen Reactions  . Ambien [Zolpidem Tartrate] Other (See Comments)    Reaction: altered mental status  . Zinc Other (See Comments)    Zinc ointments cause skin irritation    FAMILY HISTORY:  family history includes Deep vein thrombosis in her  father; Heart disease in her father. SOCIAL HISTORY:  reports that she has never smoked. She has quit using smokeless tobacco.  Her smokeless tobacco use included snuff. She reports that she does not drink alcohol and does not use drugs.  REVIEW OF SYSTEMS:   Unable to obtain due to critical illness  Pressure Injury 09/29/19 Leg Left;Posterior;Lower Stage 2 -  Partial thickness loss of dermis presenting as a shallow open injury with a red, pink wound bed without slough. three small, round stage two pressure ulcers. (Active)  09/29/19 1445  Location: Leg  Location Orientation: Left;Posterior;Lower  Staging: Stage 2 -  Partial thickness loss of dermis presenting as a shallow open injury with a red, pink wound bed without slough.  Wound Description (Comments): three small, round stage two pressure ulcers.  Present on Admission: Yes     Pressure Injury 09/29/19 Coccyx Medial Stage 4 - Full thickness tissue loss with exposed bone, tendon or muscle. (Active)  09/29/19 1445  Location: Coccyx  Location Orientation: Medial  Staging: Stage 4 - Full thickness tissue loss with exposed bone, tendon or muscle.  Wound Description (Comments):   Present on Admission: Yes     Estimated body mass index is 30.78 kg/m as calculated from the following:   Height as of this encounter: 5\' 10"  (1.778 m).   Weight as of this encounter: 97.3 kg.    VITAL SIGNS: Temp:  [98.3 F (36.8 C)] 98.3 F (36.8 C) (08/14 1208) Pulse Rate:  [89-106] 94 (08/14 1355) Resp:  [13-31] 27 (08/14 1355) BP: (68-135)/(35-53) 119/47 (08/14 1355) SpO2:  [98 %-100 %] 98 % (08/14 1355) Weight:  [97.3 kg] 97.3 kg (08/14 1207)   No intake/output data recorded. No intake/output data recorded.   SpO2: 98 % O2 Flow Rate (L/min): 4 L/min   Physical Examination:  Unresponsive, pupils are equal with sluggish reaction, corneal reflex is positive.  No gag and did not withdraw to pain. On nasal cannula, bilateral equal air  entry and no adventitious sounds.  No obvious distress. S1 & S2 are audible with no murmur Benign abdominal exam with feeble peristalsis Wasted extremities with bilateral pedal leg edema 1+.  Stage IV sacral decub with mild erythema around.   CULTURE RESULTS   No results found for this or any previous visit (from the past 240 hour(s)).        IMAGING    DG Chest Portable 1 View  Result Date: 12-17-2019 CLINICAL DATA:  Sepsis with altered mental status EXAM: PORTABLE CHEST 1 VIEW COMPARISON:  10/03/2019 FINDINGS: Cardiac shadow is enlarged but stable. Persistent infiltrate in the right base with associated effusion is seen. No new focal infiltrate is noted. No bony abnormality is seen. IMPRESSION: Stable infiltrate in the right base with associated effusion. Electronically Signed   By: 10/05/2019 M.D.   On: 12/17/2019 11:08   11/27/2019 Abdomen Limited RUQ  Result Date: December 17, 2019 CLINICAL DATA:  Elevated LFTs. EXAM: ULTRASOUND ABDOMEN LIMITED RIGHT UPPER QUADRANT COMPARISON:  None. FINDINGS: Gallbladder: Gallbladder is somewhat contracted with mild cholelithiasis. No gallbladder wall thickening or adjacent free fluid. Negative sonographic 11/27/2019  sign. Common bile duct: Diameter: 4.5 mm. Liver: Increased parenchymal echogenicity compatible with a degree of steatosis without focal mass. Portal vein is patent on color Doppler imaging with normal direction of blood flow towards the liver. Other: None. IMPRESSION: 1. Mild cholelithiasis without additional sonographic evidence to suggest cholecystitis. 2. Mild hepatic steatosis without focal mass. Electronically Signed   By: Elberta Fortis M.D.   On: 11/27/2019 12:19     Assessment and plan:  Acute respiratory failure requiring intubation for airway protection and desaturation -Intubation. -Monitor ABG, optimize ventilator settings and continuous mechanical ventilation.  Pneumonia.  Bilateral airspace disease worsening compared to chest x-ray in  June 2021. -Empiric Rocephin and vancomycin. -Monitor chest x-ray, CBC, FiO2 requirement  Altered mental status likely secondary to toxic metabolic encephalopathy. -Stat CT head to rule out CVA. -Monitor neuro status  Septic shock Severe sepsis with lactic acidosis -Empiric antimicrobial vancomycin and Rocephin -Optimize volume with crystalloid and colloid -Monitor hemodynamics and lactic acid -Follow with septic work-up. -Follow CT abdomen and pelvis with contrast.  Infected sacral decubitus history of sacral osteomyelitis -Follow with wound care  UTI.  Urine culture on 09/29/2019 growing Enterococcus faecalis and E. coli both sensitive to Rocephin and ciprofloxacin anemia  Elevated LFTs.  Abdominal ultrasound 11/26/2019 reported mild cholelithiasis no evidence of cholecystitis.  Mild hepatic steatosis without focal mass. -Optimize hemodynamics, avoid hepatotoxins and monitor LFTs.  Severe protein calorie malnutrition -Monitor prealbumin -Consider starting tube feeding.  Coagulopathy. INR is 2 -Monitor coagulation profile  Hypothyroidism -Optimize levothyroxine and monitor free T4.  DVT (Start Heparin SQ if no ICH on head CT) & GI (Famotidin) prophylaxis.  Continue with supportive care  Plan of care was discussed with Mr. Paradise, the patient son and the ED provider.  Critical care time 35 minutes    Follow-up at 6:51 PM I went to check on Ms. Kalisz, the patient is currently on Levophed 8 mics per minute, on nasal cannula. I learned from Ms. Menta's son that she started waking up in the emergency room and ED physician Dr. Darnelle Catalan decided not to proceed with intubation.  I did not receive any update from the ED team about the patient progress and change of the plan of care which was originally agreed upon as Dr. Darnelle Catalan kindly accepted to intubate the patient while she is in emergency room.   The son was very angry how l stepped into the room not being updated by the  patient progress. I asked him to talk to his mother and give her simple commands like opening her eyes moving her hands and she did open her eyes however she was lethargic. He refused to answer my questions about his mother medical history , he was inappropriate muttering vulgar language and requested that I will get out of the room. Mr Craker arrived to the bedside a minute later, I asked them if you would like to change the provider as Jessica Sarnowski needs critical care management and they answered yes.  I spoke with the ED provider who had received signout from Dr. Darnelle Catalan and he was not aware of change of plan of management. I reached out to the nurse taking care of the patient and she wasn't aware that I wasn't notified with the patient progress and she is not being intubated. I discussed the case with Dr Jayme Cloud who kindly agreed to take over her care.  Septic shock.  Improved, responded to volume resuscitation,  -systolic pressure was 140 on 8 mics of  Levophed which has been titrated down to 4 mics and the patient systolic blood pressure is 126. -Lactic acid trended down from 3.4 to 2.8  Altered mental status (improved).  Initially unresponsive, currently lethargic and opens her eyes to her name.  CT head: No acute intracranial process.  Colitis -CT abd & Pel with contrast: 1. Circumferential bowel wall thickening and hyperenhancement of the entire large bowel and the proximal rectum with associated fat stranding is consistent with colitis, which may be infectious or inflammatory. 2. Moderate volume ascites and anasarca. 3. Small right and trace left pleural effusions with associated atelectasis/consolidation. 4. Hepatic steatosis.  Limited abd US: IMPRESSION: 1. Mild cholelithiasis without additional sonographic evidence to suggest cholecystitis. 2. Mild hepatic steatosis without focal mass.  UTI.  No obstructive uropathy on CT.

## 2019-11-25 NOTE — Progress Notes (Signed)
  Chaplain On-Call responded to pager notification at 1300 by RN Claiborne Billings, who reported that the patient's son requested prayer and support.  Chaplain met patient's son Pilar Plate at bedside. Pilar Plate was outwardly distraught about many health changes for the patient, and stated often that the patient means everything to him emotionally. Chaplain provided prayer and much spiritual and emotional support.  Patient's husband arrived, and he assisted this Chaplain and the Nurses in calming and reassuring Pilar Plate at the bedside. Chaplain then accompanied Pilar Plate to the [facility] Room for additional support.  Dr. Cinda Quest discussed the need for intubation of the patient with husband and son. During preparations for the intubation, the medical team turned patient to her side. Patient awakened and was able to speak with the team as her breathing improved. Patient's husband and son stated their gratitude for this positive change in patient's condition.  Chaplain assured family of his availability for further support if the patient is admitted to the ICU.  [place] Dick Hark M.Div., [facility]

## 2019-11-25 NOTE — ED Notes (Signed)
This RN and Vernona Rieger, EDT rolling pt to place CPR pad on back. Pt became alert and verbal. MD at bedside.

## 2019-11-25 NOTE — ED Notes (Signed)
Date and time results received: 11/29/2019 11:18 AM  (use smartphrase ".now" to insert current time)  Test: Lactic  Critical Value: 3.4  Name of Provider Notified: Dr. Darnelle Catalan  Orders Received? Or Actions Taken?: No new orders at this time

## 2019-11-25 NOTE — ED Notes (Signed)
Pt awake, watching tv with spouse. resps unlabored. Bed in low and locked position.

## 2019-11-25 NOTE — ED Notes (Signed)
Pt BP increasing to 130s systolic. Pt remains unresponsive to verbal or painful stimuli

## 2019-11-26 ENCOUNTER — Inpatient Hospital Stay: Payer: Medicare Other

## 2019-11-26 DIAGNOSIS — A419 Sepsis, unspecified organism: Secondary | ICD-10-CM | POA: Diagnosis not present

## 2019-11-26 DIAGNOSIS — B961 Klebsiella pneumoniae [K. pneumoniae] as the cause of diseases classified elsewhere: Secondary | ICD-10-CM

## 2019-11-26 DIAGNOSIS — E43 Unspecified severe protein-calorie malnutrition: Secondary | ICD-10-CM

## 2019-11-26 DIAGNOSIS — L89154 Pressure ulcer of sacral region, stage 4: Secondary | ICD-10-CM

## 2019-11-26 DIAGNOSIS — R7881 Bacteremia: Secondary | ICD-10-CM | POA: Diagnosis not present

## 2019-11-26 LAB — BLOOD CULTURE ID PANEL (REFLEXED) - BCID2

## 2019-11-26 LAB — CREATININE, SERUM
Creatinine, Ser: 0.44 mg/dL (ref 0.44–1.00)
GFR calc Af Amer: >60 mL/min (ref >60)
GFR calc non Af Amer: >60 mL/min (ref >60)

## 2019-11-26 LAB — CBC WITH DIFFERENTIAL/PLATELET
Abs Immature Granulocytes: 0.12 10*3/uL — ABNORMAL HIGH (ref 0.00–0.07)
Basophils Absolute: 0.1 10*3/uL (ref 0.0–0.1)
Basophils Relative: 0 %
Eosinophils Absolute: 0.1 10*3/uL (ref 0.0–0.5)
Eosinophils Relative: 1 %
HCT: 26.5 % — ABNORMAL LOW (ref 36.0–46.0)
Hemoglobin: 9 g/dL — ABNORMAL LOW (ref 12.0–15.0)
Immature Granulocytes: 1 %
Lymphocytes Relative: 9 %
Lymphs Abs: 1.5 10*3/uL (ref 0.7–4.0)
MCH: 30.9 pg (ref 26.0–34.0)
MCHC: 34 g/dL (ref 30.0–36.0)
MCV: 91.1 fL (ref 80.0–100.0)
Monocytes Absolute: 0.8 10*3/uL (ref 0.1–1.0)
Monocytes Relative: 5 %
Neutro Abs: 13.6 10*3/uL — ABNORMAL HIGH (ref 1.7–7.7)
Neutrophils Relative %: 84 %
Platelets: 194 10*3/uL (ref 150–400)
RBC: 2.91 MIL/uL — ABNORMAL LOW (ref 3.87–5.11)
RDW: 16.3 % — ABNORMAL HIGH (ref 11.5–15.5)
WBC: 16.2 10*3/uL — ABNORMAL HIGH (ref 4.0–10.5)
nRBC: 0 % (ref 0.0–0.2)

## 2019-11-26 LAB — COMPREHENSIVE METABOLIC PANEL
ALT: 33 U/L (ref 0–44)
AST: 76 U/L — ABNORMAL HIGH (ref 15–41)
Albumin: 2.8 g/dL — ABNORMAL LOW (ref 3.5–5.0)
Alkaline Phosphatase: 141 U/L — ABNORMAL HIGH (ref 38–126)
Anion gap: 12 (ref 5–15)
BUN: 8 mg/dL (ref 8–23)
CO2: 28 mmol/L (ref 22–32)
Calcium: 8.3 mg/dL — ABNORMAL LOW (ref 8.9–10.3)
Chloride: 103 mmol/L (ref 98–111)
Creatinine, Ser: 0.69 mg/dL (ref 0.44–1.00)
GFR calc Af Amer: >60 mL/min (ref >60)
GFR calc non Af Amer: >60 mL/min (ref >60)
Glucose, Bld: 84 mg/dL (ref 70–99)
Potassium: 2.8 mmol/L — ABNORMAL LOW (ref 3.5–5.1)
Sodium: 143 mmol/L (ref 135–145)
Total Bilirubin: 2.6 mg/dL — ABNORMAL HIGH (ref 0.3–1.2)
Total Protein: 5.5 g/dL — ABNORMAL LOW (ref 6.5–8.1)

## 2019-11-26 LAB — MAGNESIUM: Magnesium: 1.7 mg/dL (ref 1.7–2.4)

## 2019-11-26 LAB — CBC
HCT: 26.3 % — ABNORMAL LOW (ref 36.0–46.0)
Hemoglobin: 8.5 g/dL — ABNORMAL LOW (ref 12.0–15.0)
MCH: 30.2 pg (ref 26.0–34.0)
MCHC: 32.3 g/dL (ref 30.0–36.0)
MCV: 93.6 fL (ref 80.0–100.0)
Platelets: 199 10*3/uL (ref 150–400)
RBC: 2.81 MIL/uL — ABNORMAL LOW (ref 3.87–5.11)
RDW: 16.3 % — ABNORMAL HIGH (ref 11.5–15.5)
WBC: 15.4 10*3/uL — ABNORMAL HIGH (ref 4.0–10.5)
nRBC: 0 % (ref 0.0–0.2)

## 2019-11-26 LAB — PREALBUMIN: Prealbumin: <5 mg/dL — ABNORMAL LOW (ref 18–38)

## 2019-11-26 LAB — TSH: TSH: 9.356 u[IU]/mL — ABNORMAL HIGH (ref 0.350–4.500)

## 2019-11-26 LAB — PHOSPHORUS: Phosphorus: 3.1 mg/dL (ref 2.5–4.6)

## 2019-11-26 LAB — LACTIC ACID, PLASMA: Lactic Acid, Venous: 1.1 mmol/L (ref 0.5–1.9)

## 2019-11-26 LAB — PROCALCITONIN: Procalcitonin: 24.05 ng/mL

## 2019-11-26 LAB — CORTISOL: Cortisol, Plasma: 13.9 ug/dL

## 2019-11-26 MED ORDER — ENOXAPARIN SODIUM 40 MG/0.4ML ~~LOC~~ SOLN
40.0000 mg | SUBCUTANEOUS | Status: DC
  Administered 2019-11-27 – 2019-12-04 (×9): 40 mg via SUBCUTANEOUS
  Filled 2019-11-26 (×9): qty 0.4

## 2019-11-26 MED ORDER — SODIUM CHLORIDE 0.9 % IV SOLN
2.0000 g | INTRAVENOUS | Status: DC
  Administered 2019-11-26 – 2019-12-04 (×9): 2 g via INTRAVENOUS
  Filled 2019-11-26: qty 2
  Filled 2019-11-26 (×2): qty 20
  Filled 2019-11-26: qty 2
  Filled 2019-11-26: qty 20
  Filled 2019-11-26 (×2): qty 2
  Filled 2019-11-26 (×3): qty 20

## 2019-11-26 MED ORDER — ALBUMIN HUMAN 25 % IV SOLN
25.0000 g | Freq: Once | INTRAVENOUS | Status: AC
  Administered 2019-11-26: 25 g via INTRAVENOUS
  Filled 2019-11-26: qty 100

## 2019-11-26 MED ORDER — ZINC OXIDE 11.3 % EX CREA
TOPICAL_CREAM | Freq: Two times a day (BID) | CUTANEOUS | Status: DC
  Filled 2019-11-26 (×3): qty 56

## 2019-11-26 MED ORDER — ALBUMIN HUMAN 5 % IV SOLN: 25.0000 g | Freq: Once | INTRAVENOUS | Status: DC

## 2019-11-26 MED ORDER — POTASSIUM CHLORIDE 10 MEQ/100ML IV SOLN
10.0000 meq | INTRAVENOUS | Status: AC
  Administered 2019-11-26 (×4): 10 meq via INTRAVENOUS
  Filled 2019-11-26 (×7): qty 100

## 2019-11-26 MED ORDER — MAGNESIUM SULFATE IN D5W 1-5 GM/100ML-% IV SOLN
1.0000 g | Freq: Once | INTRAVENOUS | Status: AC
  Administered 2019-11-26: 1 g via INTRAVENOUS
  Filled 2019-11-26: qty 100

## 2019-11-26 MED ORDER — VANCOMYCIN HCL 1250 MG/250ML IV SOLN
1250.0000 mg | Freq: Two times a day (BID) | INTRAVENOUS | Status: DC
  Administered 2019-11-26 – 2019-11-27 (×3): 1250 mg via INTRAVENOUS
  Filled 2019-11-26 (×5): qty 250

## 2019-11-26 MED ORDER — ALBUMIN HUMAN 25 % IV SOLN
12.5000 g | Freq: Two times a day (BID) | INTRAVENOUS | Status: AC
  Administered 2019-11-26 – 2019-11-28 (×4): 12.5 g via INTRAVENOUS
  Filled 2019-11-26 (×5): qty 50

## 2019-11-26 NOTE — ED Notes (Signed)
Pt turned onto right side, brief clean and dry, visitor given update

## 2019-11-26 NOTE — ED Notes (Signed)
Pt turned onto back and brief checked- pt clean and dry

## 2019-11-26 NOTE — Consult Note (Signed)
Pharmacy Antibiotic Note  Jessica [name] is a 74 y.o. female admitted on 12/16/2019 with sepsis.  Pharmacy has been consulted for Vancomycin dosing. Patient currently also receiving Rocephin  Plan: Continue vancomycin 1250mg  Q12 hours  Height: 5\' 10"  (177.8 cm) Weight: 97.3 kg (214 lb 8.1 oz) IBW/kg (Calculated) : 68.5  Temp (24hrs), Avg:98.1 F (36.7 C), Min:97.9 F (36.6 C), Max:98.3 F (36.8 C)  Recent Labs  Lab 12-16-2019 1021 Dec 16, 2019 1239 December 16, 2019 1853 11/26/19 0846  WBC 28.2*  --   --  16.2*  CREATININE 0.77  --   --  0.69  LATICACIDVEN 3.4* 2.8* 2.0*  --     Estimated Creatinine Clearance: 80.3 mL/min (by C-G formula based on SCr of 0.69 mg/dL).    Allergies  Allergen Reactions   Ambien [Zolpidem Tartrate] Other (See Comments)    Reaction: altered mental status   Zinc Other (See Comments)    Zinc ointments cause skin irritation    Antimicrobials this admission: Vancomycin 8/14 >>  Ceftriaxone 8/14 >>  Cefepime x 1   Microbiology results: 8/14 BCx: K.pneumo 8/14 UCx: pending    Thank you for allowing pharmacy to be a part of this patients care.  9/14, PharmD 11/26/2019 10:12 AM

## 2019-11-26 NOTE — ED Notes (Signed)
Lab at bedside to collect blood work 

## 2019-11-26 NOTE — ED Notes (Signed)
Pt sleeping, resps unlabored.  

## 2019-11-26 NOTE — ED Notes (Signed)
Pt placed back on back. Pt sleeping. Son at bedside. Vss.

## 2019-11-26 NOTE — Progress Notes (Signed)
Ch visited with [name] standing outside of room while Ch was in the ED for other visits. Ch knows [name] from previous visits from Pt's previous hospitalization. [name] expressed concerns to Ch. Ch provided pastoral presence and support.

## 2019-11-26 NOTE — ED Notes (Signed)
Pt awoken by this rn in room. Son asleep at bedside. Apologized to pt for waking her, pt nods head in aknowledgement.

## 2019-11-26 NOTE — Progress Notes (Signed)
PHARMACY - PHYSICIAN COMMUNICATION CRITICAL VALUE ALERT - BLOOD CULTURE IDENTIFICATION (BCID)  Jessica [name] is an 74 y.o. female who presented to [facility] on 12/08/2019 with a chief complaint of admitted as a CODE SEPSIS w/ h/o sacral osteomyelitis, spinal stenosis, CHF presents for unresponsiveness  Assessment:  WBC 28.2, LA 3.4 >> 2.8 >> 2.0, UA leuk, nitrite positive, bacteria. 2/4 GNR Enterobacterales Kleb pneumoniae KPC negative.  Name of physician (or Provider) Contacted: Maude Leriche  Current antibiotics: Vanc/ceftriaxone  Changes to prescribed antibiotics recommended:  Recommendations accepted by provider -- Ceftriaxone will be increased to 2g IV q24h; however, vanc will be continued per provider preference for concern of sacral osteomyelitis.  Results for orders placed or performed during the hospital encounter of 12/02/2019  Blood Culture ID Panel (Reflexed) (Collected: 11/15/2019 10:21 AM)  Result Value Ref Range   Enterococcus faecalis NOT DETECTED NOT DETECTED   Enterococcus Faecium NOT DETECTED NOT DETECTED   Listeria monocytogenes NOT DETECTED NOT DETECTED   Staphylococcus species NOT DETECTED NOT DETECTED   Staphylococcus aureus (BCID) NOT DETECTED NOT DETECTED   Staphylococcus epidermidis NOT DETECTED NOT DETECTED   Staphylococcus lugdunensis NOT DETECTED NOT DETECTED   Streptococcus species NOT DETECTED NOT DETECTED   Streptococcus agalactiae NOT DETECTED NOT DETECTED   Streptococcus pneumoniae NOT DETECTED NOT DETECTED   Streptococcus pyogenes NOT DETECTED NOT DETECTED   A.calcoaceticus-baumannii NOT DETECTED NOT DETECTED   Bacteroides fragilis NOT DETECTED NOT DETECTED   Enterobacterales DETECTED (A) NOT DETECTED   Enterobacter cloacae complex NOT DETECTED NOT DETECTED   Escherichia coli NOT DETECTED NOT DETECTED   Klebsiella aerogenes NOT DETECTED NOT DETECTED   Klebsiella oxytoca NOT DETECTED NOT DETECTED   Klebsiella pneumoniae DETECTED (A) NOT  DETECTED   Proteus species NOT DETECTED NOT DETECTED   Salmonella species NOT DETECTED NOT DETECTED   Serratia marcescens NOT DETECTED NOT DETECTED   Haemophilus influenzae NOT DETECTED NOT DETECTED   Neisseria meningitidis NOT DETECTED NOT DETECTED   Pseudomonas aeruginosa NOT DETECTED NOT DETECTED   Stenotrophomonas maltophilia NOT DETECTED NOT DETECTED   Candida albicans NOT DETECTED NOT DETECTED   Candida auris NOT DETECTED NOT DETECTED   Candida glabrata NOT DETECTED NOT DETECTED   Candida krusei NOT DETECTED NOT DETECTED   Candida parapsilosis NOT DETECTED NOT DETECTED   Candida tropicalis NOT DETECTED NOT DETECTED   Cryptococcus neoformans/gattii NOT DETECTED NOT DETECTED   CTX-M ESBL NOT DETECTED NOT DETECTED   Carbapenem resistance IMP NOT DETECTED NOT DETECTED   Carbapenem resistance KPC NOT DETECTED NOT DETECTED   Carbapenem resistance NDM NOT DETECTED NOT DETECTED   Carbapenem resist OXA 48 LIKE NOT DETECTED NOT DETECTED   Carbapenem resistance VIM NOT DETECTED NOT DETECTED   Thomasene Ripple, PharmD, BCPS Clinical Pharmacist 11/26/2019  4:04 AM

## 2019-11-26 NOTE — Progress Notes (Signed)
PHARMACY CONSULT NOTE - FOLLOW UP  Pharmacy Consult for Electrolyte Monitoring and Replacement   Recent Labs: Potassium (mmol/L)  Date Value  11/26/2019 2.8 (L)   Magnesium (mg/dL)  Date Value  23/55/7322 1.7   Calcium (mg/dL)  Date Value  02/54/2706 8.3 (L)   Albumin (g/dL)  Date Value  23/76/2831 2.8 (L)   Phosphorus (mg/dL)  Date Value  51/76/1607 3.1   Sodium (mmol/L)  Date Value  11/26/2019 143     Assessment: 74 year old female with K.pneumo bacteremia. Pharmacy consulted for electrolyte management.  Goal of Therapy:  Electrolytes WNL  Plan:  Potassium 10 mEq IV x 5 runs + mag 1 g IV x 1. Follow up with morning labs.  Pricilla Riffle ,PharmD Clinical Pharmacist 11/26/2019 10:18 AM

## 2019-11-26 NOTE — Progress Notes (Signed)
Follow up - Critical Care Medicine Note  Patient Details:    Jessica [name] is an 74 y.o. female with hx of spinal stenosis - bed-bound X 15 yrs, psychotic depression, neurogenic bladder, recurrent UTI, obesity, frequent hospitalizations since 2018.  Previously with suspected PNA, pleural effusion, pericardial effusion in 2018.  Effusion and atelectasis, and go.  Has chronic stage IV sacral decubitus ulcer and recurrent UTIs.  Right atelectasis/pleural effusion are chronic due to chronic bedridden status.  Prior work-up did not reveal infectious cause.  Presented this time with encephalopathy, gram-negative sepsis likely due to urinary tract infection.  PCCM was asked to admit.  Lines, Airways, Drains: Urethral Catheter Carlyn Reichert, RN Latex 16 Fr. (Active)    Anti-infectives:  Anti-infectives (From admission, onward)   Start     Dose/Rate Route Frequency Ordered Stop   11/26/19 0800  vancomycin (VANCOREADY) IVPB 1250 mg/250 mL  Status:  Discontinued        1,250 mg 166.7 mL/hr over 90 Minutes Intravenous Every 12 hours 12/19/2019 1725 11/26/19 0304   11/26/19 0430  vancomycin (VANCOREADY) IVPB 1250 mg/250 mL     Discontinue     1,250 mg 166.7 mL/hr over 90 Minutes Intravenous Every 12 hours 11/26/19 0304     11/26/19 0403  cefTRIAXone (ROCEPHIN) 2 g in sodium chloride 0.9 % 100 mL IVPB     Discontinue     2 g 200 mL/hr over 30 Minutes Intravenous Every 24 hours 11/26/19 0403     19-Dec-2019 1800  cefTRIAXone (ROCEPHIN) 1 g in sodium chloride 0.9 % 100 mL IVPB  Status:  Discontinued        1 g 200 mL/hr over 30 Minutes Intravenous Every 24 hours December 19, 2019 1446 11/26/19 0403   12-19-2019 1530  vancomycin (VANCOREADY) IVPB 2000 mg/400 mL        2,000 mg 200 mL/hr over 120 Minutes Intravenous  Once 2019/12/19 1504 12-19-2019 1837   12-19-19 1030  ceFEPIme (MAXIPIME) 1 g in sodium chloride 0.9 % 100 mL IVPB        1 g 200 mL/hr over 30 Minutes Intravenous  Once 12/19/19 1016 12-19-19 1113      Scheduled Meds: . enoxaparin (LOVENOX) injection  40 mg Subcutaneous Q24H  . zinc oxide   Topical BID   Continuous Infusions: . albumin human Stopped (11/26/19 1637)  . cefTRIAXone (ROCEPHIN)  IV Stopped (11/26/19 8676)  . famotidine (PEPCID) IV Stopped (11/26/19 1723)  . lactated ringers 100 mL/hr at 11/26/19 1632  . norepinephrine (LEVOPHED) Adult infusion 2 mcg/min (11/26/19 1727)  . vancomycin 1,250 mg (11/26/19 1727)   PRN Meds:.docusate sodium, polyethylene glycol   Microbiology: Results for orders placed or performed during the hospital encounter of 12-19-19  Culture, blood (Routine x 2)     Status: None (Preliminary result)   Collection Time: 12/19/2019 10:21 AM   Specimen: BLOOD  Result Value Ref Range Status   Specimen Description   Final    BLOOD RIGHT ANTECUBITAL Performed at [facility], [address] [place], [place] [address]    Special Requests   Final    BOTTLES DRAWN AEROBIC AND ANAEROBIC Blood Culture results may not be optimal due to an excessive volume of blood received in culture bottles   Culture  Setup Time   Final    Organism ID to follow GRAM NEGATIVE RODS IN BOTH AEROBIC AND ANAEROBIC BOTTLES CRITICAL RESULT CALLED TO, READ BACK BY AND VERIFIED WITH: DAVID BESANTI 11/26/19 AT 0255 HS  Performed at [facility], [address] [place], [place] [address]    Culture GRAM NEGATIVE RODS  Final   Report Status PENDING  Incomplete  Culture, blood (Routine x 2)     Status: None (Preliminary result)   Collection Time: 12/08/2019 10:21 AM   Specimen: BLOOD  Result Value Ref Range Status   Specimen Description BLOOD RH  Final   Special Requests   Final    BOTTLES DRAWN AEROBIC AND ANAEROBIC Blood Culture results may not be optimal due to an excessive volume of blood received in culture bottles   Culture  Setup Time   Final    GRAM NEGATIVE RODS AEROBIC BOTTLE ONLY CRITICAL VALUE NOTED.  VALUE IS CONSISTENT WITH PREVIOUSLY REPORTED AND  CALLED VALUE. Performed at [facility], [address] [place], [place] [address]    Culture GRAM NEGATIVE RODS  Final   Report Status PENDING  Incomplete  Blood Culture ID Panel (Reflexed)     Status: Abnormal   Collection Time: 11/19/2019 10:21 AM  Result Value Ref Range Status   Enterococcus faecalis NOT DETECTED NOT DETECTED Final   Enterococcus Faecium NOT DETECTED NOT DETECTED Final   Listeria monocytogenes NOT DETECTED NOT DETECTED Final   Staphylococcus species NOT DETECTED NOT DETECTED Final   Staphylococcus aureus (BCID) NOT DETECTED NOT DETECTED Final   Staphylococcus epidermidis NOT DETECTED NOT DETECTED Final   Staphylococcus lugdunensis NOT DETECTED NOT DETECTED Final   Streptococcus species NOT DETECTED NOT DETECTED Final   Streptococcus agalactiae NOT DETECTED NOT DETECTED Final   Streptococcus pneumoniae NOT DETECTED NOT DETECTED Final   Streptococcus pyogenes NOT DETECTED NOT DETECTED Final   A.calcoaceticus-baumannii NOT DETECTED NOT DETECTED Final   Bacteroides fragilis NOT DETECTED NOT DETECTED Final   Enterobacterales DETECTED (A) NOT DETECTED Final    Comment: Enterobacterales represent a large order of gram negative bacteria, not a single organism. CRITICAL RESULT CALLED TO, READ BACK BY AND VERIFIED WITH: DAVID BESANTI 11/26/19 AT 0255 HS    Enterobacter cloacae complex NOT DETECTED NOT DETECTED Final   Escherichia coli NOT DETECTED NOT DETECTED Final   Klebsiella aerogenes NOT DETECTED NOT DETECTED Final   Klebsiella oxytoca NOT DETECTED NOT DETECTED Final   Klebsiella pneumoniae DETECTED (A) NOT DETECTED Final    Comment: CRITICAL RESULT CALLED TO, READ BACK BY AND VERIFIED WITH: DAVID BESANTI 11/26/19 AT 0255 HS    Proteus species NOT DETECTED NOT DETECTED Final   Salmonella species NOT DETECTED NOT DETECTED Final   Serratia marcescens NOT DETECTED NOT DETECTED Final   Haemophilus influenzae NOT DETECTED NOT DETECTED Final   Neisseria  meningitidis NOT DETECTED NOT DETECTED Final   Pseudomonas aeruginosa NOT DETECTED NOT DETECTED Final   Stenotrophomonas maltophilia NOT DETECTED NOT DETECTED Final   Candida albicans NOT DETECTED NOT DETECTED Final   Candida auris NOT DETECTED NOT DETECTED Final   Candida glabrata NOT DETECTED NOT DETECTED Final   Candida krusei NOT DETECTED NOT DETECTED Final   Candida parapsilosis NOT DETECTED NOT DETECTED Final   Candida tropicalis NOT DETECTED NOT DETECTED Final   Cryptococcus neoformans/gattii NOT DETECTED NOT DETECTED Final   CTX-M ESBL NOT DETECTED NOT DETECTED Final   Carbapenem resistance IMP NOT DETECTED NOT DETECTED Final   Carbapenem resistance KPC NOT DETECTED NOT DETECTED Final   Carbapenem resistance NDM NOT DETECTED NOT DETECTED Final   Carbapenem resist OXA 48 LIKE NOT DETECTED NOT DETECTED Final   Carbapenem resistance VIM NOT DETECTED NOT DETECTED Final  Comment: Performed at [facility], [address] [place], [place] [address]  SARS Coronavirus 2 by RT PCR (hospital order, performed in [facility] hospital lab) Nasopharyngeal Nasopharyngeal Swab     Status: None   Collection Time: 12/19/19 12:39 PM   Specimen: Nasopharyngeal Swab  Result Value Ref Range Status   SARS Coronavirus 2 NEGATIVE NEGATIVE Final    Comment: (NOTE) SARS-CoV-2 target nucleic acids are NOT DETECTED.  The SARS-CoV-2 RNA is generally detectable in upper and lower respiratory specimens during the acute phase of infection. The lowest concentration of SARS-CoV-2 viral copies this assay can detect is 250 copies / mL. A negative result does not preclude SARS-CoV-2 infection and should not be used as the sole basis for treatment or other patient management decisions.  A negative result may occur with improper specimen collection / handling, submission of specimen other than nasopharyngeal swab, presence of viral mutation(s) within the areas targeted by this assay, and inadequate  number of viral copies (<250 copies / mL). A negative result must be combined with clinical observations, patient history, and epidemiological information.  Fact Sheet for Patients:   BoilerBrush.com.cyhttps://www.fda.gov/media/136312/download  Fact Sheet for Healthcare Providers: https://pope.com/https://www.fda.gov/media/136313/download  This test is not yet approved or  cleared by the [place] FDA and has been authorized for detection and/or diagnosis of SARS-CoV-2 by FDA under an Emergency Use Authorization (EUA).  This EUA will remain in effect (meaning this test can be used) for the duration of the COVID-19 declaration under Section 564(b)(1) of the Act, 21 U.S.C. section 360bbb-3(b)(1), unless the authorization is terminated or revoked sooner.  Performed at [facility], [address] [place], [place] [address]    Results for orders placed or performed during the hospital encounter of 12/19/19 (from the past 24 hour(s))  Lactic acid, plasma     Status: Abnormal   Collection Time: 12/19/19  6:53 PM  Result Value Ref Range   Lactic Acid, Venous 2.0 (HH) 0.5 - 1.9 mmol/L  CBC with Differential/Platelet     Status: Abnormal   Collection Time: 11/26/19  8:46 AM  Result Value Ref Range   WBC 16.2 (H) 4.0 - 10.5 K/uL   RBC 2.91 (L) 3.87 - 5.11 MIL/uL   Hemoglobin 9.0 (L) 12.0 - 15.0 g/dL   HCT 29.526.5 (L) 36 - 46 %   MCV 91.1 80.0 - 100.0 fL   MCH 30.9 26.0 - 34.0 pg   MCHC 34.0 30.0 - 36.0 g/dL   RDW 62.116.3 (H) 30.811.5 - 65.715.5 %   Platelets 194 150 - 400 K/uL   nRBC 0.0 0.0 - 0.2 %   Neutrophils Relative % 84 %   Neutro Abs 13.6 (H) 1.7 - 7.7 K/uL   Lymphocytes Relative 9 %   Lymphs Abs 1.5 0.7 - 4.0 K/uL   Monocytes Relative 5 %   Monocytes Absolute 0.8 0 - 1 K/uL   Eosinophils Relative 1 %   Eosinophils Absolute 0.1 0 - 0 K/uL   Basophils Relative 0 %   Basophils Absolute 0.1 0 - 0 K/uL   Immature Granulocytes 1 %   Abs Immature Granulocytes 0.12 (H) 0.00 - 0.07 K/uL  Comprehensive  metabolic panel     Status: Abnormal   Collection Time: 11/26/19  8:46 AM  Result Value Ref Range   Sodium 143 135 - 145 mmol/L   Potassium 2.8 (L) 3.5 - 5.1 mmol/L   Chloride 103 98 - 111 mmol/L   CO2 28 22 - 32  mmol/L   Glucose, Bld 84 70 - 99 mg/dL   BUN 8 8 - 23 mg/dL   Creatinine, Ser 1.61 0.44 - 1.00 mg/dL   Calcium 8.3 (L) 8.9 - 10.3 mg/dL   Total Protein 5.5 (L) 6.5 - 8.1 g/dL   Albumin 2.8 (L) 3.5 - 5.0 g/dL   AST 76 (H) 15 - 41 U/L   ALT 33 0 - 44 U/L   Alkaline Phosphatase 141 (H) 38 - 126 U/L   Total Bilirubin 2.6 (H) 0.3 - 1.2 mg/dL   GFR calc non Af Amer >60 >60 mL/min   GFR calc Af Amer >60 >60 mL/min   Anion gap 12 5 - 15  Prealbumin     Status: Abnormal   Collection Time: 11/26/19  8:46 AM  Result Value Ref Range   Prealbumin <5 (L) 18 - 38 mg/dL  Procalcitonin - Baseline     Status: None   Collection Time: 11/26/19  8:46 AM  Result Value Ref Range   Procalcitonin 24.05 ng/mL  Magnesium     Status: None   Collection Time: 11/26/19  8:46 AM  Result Value Ref Range   Magnesium 1.7 1.7 - 2.4 mg/dL  Phosphorus     Status: None   Collection Time: 11/26/19  8:46 AM  Result Value Ref Range   Phosphorus 3.1 2.5 - 4.6 mg/dL    Best Practice/Protocols:  VTE Prophylaxis: Lovenox (prophylaxtic dose) GI Prophylaxis: Antihistamine   Events: 12/03/2019: Admitted due to change in mental status, hypotension, recurrent gram-negative sepsis.  CT abdomen consistent with malnutrition (anasarca) and questionable colitis 11/26/2019: Blood cultures positive for Klebsiella pneumoniae, likely UTI, urine culture pending.  Check for C. difficile due to questionable colitis  Studies: CT HEAD WO CONTRAST  Result Date: 11/16/2019 CLINICAL DATA:  Mental status change, decreased responsiveness. EXAM: CT HEAD WITHOUT CONTRAST TECHNIQUE: Contiguous axial images were obtained from the base of the skull through the vertex without intravenous contrast. COMPARISON:  CT head dated  01/19/2017. FINDINGS: Brain: No evidence of acute infarction, hemorrhage, hydrocephalus, extra-axial collection or mass lesion/mass effect. There is mild cerebral volume loss with associated ex vacuo dilatation. Periventricular white matter hypoattenuation likely represents chronic small vessel ischemic disease. Vascular: There are vascular calcifications in the carotid siphons. Skull: Normal. Negative for fracture or focal lesion. Sinuses/Orbits: No acute finding. Other: None. IMPRESSION: No acute intracranial process. Electronically Signed   By: Romona Curls M.D.   On: 12/10/2019 16:37   CT ABDOMEN PELVIS W CONTRAST  Result Date: 12/11/2019 CLINICAL DATA:  Sepsis EXAM: CT ABDOMEN AND PELVIS WITH CONTRAST TECHNIQUE: Multidetector CT imaging of the abdomen and pelvis was performed using the standard protocol following bolus administration of intravenous contrast. CONTRAST:  OMNIPAQUE IOHEXOL 300 MG/ML  SOLN COMPARISON:  CT abdomen dated 03/12/2017. FINDINGS: Lower chest: There are small right and trace left pleural effusions with associated atelectasis/consolidation. Hepatobiliary: The liver is hypoattenuating, consistent with hepatic steatosis. No focal liver abnormality is seen. No gallstones, gallbladder wall thickening, or biliary dilatation. Pancreas: Unremarkable. No pancreatic ductal dilatation or surrounding inflammatory changes. Spleen: Normal in size without focal abnormality. Adrenals/Urinary Tract: Adrenal glands are unremarkable. Kidneys are normal, without renal calculi, focal lesion, or hydronephrosis. Bladder is partially deflated with a Foley catheter in place. Stomach/Bowel: There is a small hiatal hernia. Appendix appears normal. There is circumferential bowel wall thickening and hyperenhancement of the entire large bowel and the proximal rectum with associated fat stranding. No evidence of bowel wall thickening, distention, or  inflammatory changes. Vascular/Lymphatic: Aortic  atherosclerosis. No enlarged abdominal or pelvic lymph nodes. Reproductive: Status post hysterectomy. No adnexal masses. Other: There is moderate volume ascites. Anasarca is noted. No abdominal wall hernia. Musculoskeletal: Degenerative changes are seen in the spine. There is diffuse osseous demineralization. IMPRESSION: 1. Circumferential bowel wall thickening and hyperenhancement of the entire large bowel and the proximal rectum with associated fat stranding is consistent with colitis, which may be infectious or inflammatory. 2. Moderate volume ascites and anasarca. 3. Small right and trace left pleural effusions with associated atelectasis/consolidation. 4. Hepatic steatosis. Aortic Atherosclerosis (ICD10-I70.0). Electronically Signed   By: Romona Curls M.D.   On: 12/08/2019 16:54   DG Chest Port 1 View  Result Date: 11/26/2019 CLINICAL DATA:  Follow-up pneumonia EXAM: PORTABLE CHEST 1 VIEW COMPARISON:  2019/12/08 FINDINGS: Cardiac shadow is enlarged but stable. The lungs are well aerated bilaterally. Persistent right basilar effusion and infiltrate are seen similar to that noted on the prior exam. No new focal infiltrate is seen. No bony abnormality is noted. IMPRESSION: Persistent right lower lobe infiltrate and effusion. Electronically Signed   By: Alcide Clever M.D.   On: 11/26/2019 05:19   DG Chest Portable 1 View  Result Date: 12/08/2019 CLINICAL DATA:  Sepsis with altered mental status EXAM: PORTABLE CHEST 1 VIEW COMPARISON:  10/03/2019 FINDINGS: Cardiac shadow is enlarged but stable. Persistent infiltrate in the right base with associated effusion is seen. No new focal infiltrate is noted. No bony abnormality is seen. IMPRESSION: Stable infiltrate in the right base with associated effusion. Electronically Signed   By: Alcide Clever M.D.   On: 12/08/19 11:08   US Abdomen Limited RUQ  Result Date: 2019/12/08 CLINICAL DATA:  Elevated LFTs. EXAM: ULTRASOUND ABDOMEN LIMITED RIGHT UPPER QUADRANT  COMPARISON:  None. FINDINGS: Gallbladder: Gallbladder is somewhat contracted with mild cholelithiasis. No gallbladder wall thickening or adjacent free fluid. Negative sonographic Murphy sign. Common bile duct: Diameter: 4.5 mm. Liver: Increased parenchymal echogenicity compatible with a degree of steatosis without focal mass. Portal vein is patent on color Doppler imaging with normal direction of blood flow towards the liver. Other: None. IMPRESSION: 1. Mild cholelithiasis without additional sonographic evidence to suggest cholecystitis. 2. Mild hepatic steatosis without focal mass. Electronically Signed   By: Elberta Fortis M.D.   On: 12-08-2019 12:19    Consults:    Subjective:    Overnight Issues: Patient responded to volume resuscitation and to low-dose pressors.  She did not require intubation and mechanical ventilation.  She is currently on low-flow O2 saturating at 100%.  No respiratory distress noted.  She responds that she is "fine" when asked how she is.  Speaking in monosyllables with a depressed affect.  Objective:  Vital signs for last 24 hours: Temp:  [97.9 F (36.6 C)] 97.9 F (36.6 C) (08/14 2220) Pulse Rate:  [68-107] 102 (08/15 1200) Resp:  [11-27] 27 (08/15 1200) BP: (92-140)/(43-62) 114/48 (08/15 1200) SpO2:  [97 %-100 %] 100 % (08/15 1200)  Hemodynamic parameters for last 24 hours:    Intake/Output from previous day: 08/14 0701 - 08/15 0700 In: 850 [IV Piggyback:850] Out: 3400 [Urine:3400]  Intake/Output this shift: Total I/O In: 289 [IV Piggyback:289] Out: 800 [Urine:800]  Vent settings for last 24 hours:    Physical Exam:  GENERAL: Chronically ill-appearing woman, awake, flat affect. HEAD: Normocephalic, atraumatic.  EYES: Pupils equal, round, reactive to light.  No scleral icterus.  MOUTH: Oral mucosa moist, no thrush. NECK: Supple. No thyromegaly. Trachea midline. No JVD.  No adenopathy. PULMONARY: Good air entry bilaterally, coarse, decreased breath  sounds on right base (chronic) CARDIOVASCULAR: S1 and S2. Regular rate and rhythm.  Grade 2/6 to 3/6 harsh systolic ejection murmur left sternal border. GASTROINTESTINAL: Soft, nondistended, no tenderness elicited, bowel sounds present all quadrants. MUSCULOSKELETAL: No joint deformity, no clubbing, 2+ lower extremity edema, diffuse muscle wasting. NEUROLOGIC: Paraplegic, upper extremity weakness due to chronic bedridden status. SKIN: Intact,warm,dry.  Pressure ulcers as described. PSYCH: Flat affect  Pressure Injury 09/29/19 Leg Left;Posterior;Lower Stage 2 -  Partial thickness loss of dermis presenting as a shallow open injury with a red, pink wound bed without slough. three small, round stage two pressure ulcers. (Active)  09/29/19 1445  Location: Leg  Location Orientation: Left;Posterior;Lower  Staging: Stage 2 -  Partial thickness loss of dermis presenting as a shallow open injury with a red, pink wound bed without slough.  Wound Description (Comments): three small, round stage two pressure ulcers.  Present on Admission: Yes     Pressure Injury 09/29/19 Coccyx Medial Stage 4 - Full thickness tissue loss with exposed bone, tendon or muscle. (Active)  09/29/19 1445  Location: Coccyx  Location Orientation: Medial  Staging: Stage 4 - Full thickness tissue loss with exposed bone, tendon or muscle.  Wound Description (Comments):   Present on Admission: Yes   Pressure ulcers measurement:Per [facility] Provider, Allen Derry, III, PA on 11/09/19: 4.9cm x 3.9cm x 0.5cm  Chest x-ray today: Chronic atelectasis and pleural effusion on the right.   Assessment/Plan:   Acute metabolic encephalopathy Underlying history of psychotic depression Due to severe sepsis from gram-negative's Aggravated by chronic bedridden status/malnutrition  Severe sepsis/septic shock Klebsiella pneumoniae bacteremia Likely recurrent urinary tract infection Chronic indwelling Foley due to spinal  stenosis/paraplegia Other potential source: Sacral decubitus Continue ceftriaxone Discontinue vancomycin if not indicated by cultures Weaning off pressors has only required low-dose Supplement albumin, IV fluids  Stage IV sacral decubitus Continue wound management Appreciate WOC nurse's input  CT abdomen with possible colitis infectious versus other Anasarca due to malnutrition Check for C. Difficile Check ANA Suspect colitis likely due to bowel edema malnutrition Excluding infectious causes/inflammatory as above  Chronic recurrent right lower lobe atelectasis and effusion This is not pneumonia Evanescent/recurrent since 2018 Will be present due to chronic bedridden status Patient does not have cough or sputum production  Severe protein calorie malnutrition Prealbumin less than 5 Severe hypoalbuminemia Low muscle mass Initiate p.o.'s once able This issue adds complexity to her management Exceedingly poor prognostic sign Will not allow for healing skin issues  Pressure ulcers of skin Present POA WOC nurse consult, appreciate input Cleansing and dressing per WOC protocol Turning side to side per protocol HOB at 30 degree angle or less as tolerated Heels to be placed on Prevalon boots    LOS: 1 day   Additional comments: Updated patient's husband at bedside.  Discussed with Dr. Dow Adolph, DO who will assume care in the morning.  Consider social services consultation.  Critical Care Total Time*: Level 3 follow-up (35 minutes)  C. Danice Goltz, MD [place] [facility] 11/26/2019  *This note was dictated using voice recognition software/Dragon.  Despite best efforts to proofread, errors can occur which can change the meaning.  Any change was purely unintentional.

## 2019-11-26 NOTE — ED Notes (Signed)
Waiting on vancomycin to finish before starting rocephin. Continue to await albumin from pharmacy.

## 2019-11-26 NOTE — ED Notes (Signed)
Lab notified of need for venipuncture assist for 0500 am labs.

## 2019-11-26 NOTE — ED Notes (Signed)
Report to ariel, rn.  

## 2019-11-26 NOTE — ED Notes (Signed)
Called lab for update and was given ETA of 20 minutes

## 2019-11-26 NOTE — Consult Note (Signed)
WOC Nurse Consult Note: Reason for Consult: Stage 4 pressure injury to the left side of sacrum. Patient is followed by the outpatient [facility] and was last seen on 11/09/19. Last seen by this writer on 10/01/19. Wound type:Pressure Pressure Injury POA: Yes Measurement: Per [facility] Provider, Allen Derry, III, PA on 11/09/19: 4.9cm x 3.9cm x 0.5cm Wound bed: Red, moist  Drainage (amount, consistency, odor) small amount serous Periwound:intact Dressing procedure/placement/frequency: Will continue the POC as detailed in Allen Derry, III, PA's note except we will not continue the use of sodium hypochlorite (1/4 strength Dakin's solution) daily.  We will cleanse with NS, protect the periwound with 11.3% zinc oxide (Balmex as we have in the past-the family and Mr. Larina Bras have agreed that this concentration of Z.O. is low enough to cause a reaction. We will place an alginate dressing into the wound bed, top with a dry dressing, ABD pad and secure with paper tape. Patient is to be turned side to side per house protocol and the [facility] at a 30 degree angle or less as tolerated. Heels are to be placed in the [employer] provided previously.  WOC nursing team will not follow, but will remain available to this patient, the nursing and medical teams.  Please re-consult if needed. Thanks, Ladona Mow, MSN, RN, GNP, [place]  Pager# [phone]

## 2019-11-26 NOTE — ED Notes (Signed)
Waiting on albumin from pharmacy

## 2019-11-27 ENCOUNTER — Inpatient Hospital Stay: Payer: Self-pay

## 2019-11-27 DIAGNOSIS — J9811 Atelectasis: Secondary | ICD-10-CM

## 2019-11-27 LAB — MAGNESIUM: Magnesium: 1.7 mg/dL (ref 1.7–2.4)

## 2019-11-27 LAB — CBC WITH DIFFERENTIAL/PLATELET
Abs Immature Granulocytes: 0.06 10*3/uL (ref 0.00–0.07)
Basophils Absolute: 0.1 10*3/uL (ref 0.0–0.1)
Basophils Relative: 1 %
Eosinophils Absolute: 0.2 10*3/uL (ref 0.0–0.5)
Eosinophils Relative: 2 %
HCT: 25 % — ABNORMAL LOW (ref 36.0–46.0)
Hemoglobin: 8.6 g/dL — ABNORMAL LOW (ref 12.0–15.0)
Immature Granulocytes: 1 %
Lymphocytes Relative: 16 %
Lymphs Abs: 2 10*3/uL (ref 0.7–4.0)
MCH: 31.3 pg (ref 26.0–34.0)
MCHC: 34.4 g/dL (ref 30.0–36.0)
MCV: 90.9 fL (ref 80.0–100.0)
Monocytes Absolute: 0.8 10*3/uL (ref 0.1–1.0)
Monocytes Relative: 6 %
Neutro Abs: 9.7 10*3/uL — ABNORMAL HIGH (ref 1.7–7.7)
Neutrophils Relative %: 74 %
Platelets: 188 10*3/uL (ref 150–400)
RBC: 2.75 MIL/uL — ABNORMAL LOW (ref 3.87–5.11)
RDW: 16.8 % — ABNORMAL HIGH (ref 11.5–15.5)
WBC: 12.9 10*3/uL — ABNORMAL HIGH (ref 4.0–10.5)
nRBC: 0 % (ref 0.0–0.2)

## 2019-11-27 LAB — COMPREHENSIVE METABOLIC PANEL
ALT: 30 U/L (ref 0–44)
AST: 61 U/L — ABNORMAL HIGH (ref 15–41)
Albumin: 2.5 g/dL — ABNORMAL LOW (ref 3.5–5.0)
Alkaline Phosphatase: 126 U/L (ref 38–126)
Anion gap: 13 (ref 5–15)
BUN: 7 mg/dL — ABNORMAL LOW (ref 8–23)
CO2: 24 mmol/L (ref 22–32)
Calcium: 7.9 mg/dL — ABNORMAL LOW (ref 8.9–10.3)
Chloride: 101 mmol/L (ref 98–111)
Creatinine, Ser: 0.59 mg/dL (ref 0.44–1.00)
GFR calc Af Amer: >60 mL/min (ref >60)
GFR calc non Af Amer: >60 mL/min (ref >60)
Glucose, Bld: 68 mg/dL — ABNORMAL LOW (ref 70–99)
Potassium: 3 mmol/L — ABNORMAL LOW (ref 3.5–5.1)
Sodium: 138 mmol/L (ref 135–145)
Total Bilirubin: 2.3 mg/dL — ABNORMAL HIGH (ref 0.3–1.2)
Total Protein: 5.2 g/dL — ABNORMAL LOW (ref 6.5–8.1)

## 2019-11-27 LAB — C DIFFICILE QUICK SCREEN W PCR REFLEX
C Diff antigen: NEGATIVE
C Diff interpretation: NOT DETECTED
C Diff toxin: NEGATIVE

## 2019-11-27 LAB — T4, FREE: Free T4: 0.64 ng/dL (ref 0.61–1.12)

## 2019-11-27 LAB — GLUCOSE, CAPILLARY
Glucose-Capillary: 64 mg/dL — ABNORMAL LOW (ref 70–99)
Glucose-Capillary: 108 mg/dL — ABNORMAL HIGH (ref 70–99)

## 2019-11-27 LAB — PROCALCITONIN: Procalcitonin: 15.09 ng/mL

## 2019-11-27 LAB — LACTIC ACID, PLASMA: Lactic Acid, Venous: 0.7 mmol/L (ref 0.5–1.9)

## 2019-11-27 LAB — PHOSPHORUS: Phosphorus: 2.7 mg/dL (ref 2.5–4.6)

## 2019-11-27 MED ORDER — DEXTROSE 50 % IV SOLN
1.0000 | Freq: Once | INTRAVENOUS | Status: AC
  Administered 2019-11-27: 50 mL via INTRAVENOUS
  Filled 2019-11-27: qty 50

## 2019-11-27 MED ORDER — CHLORHEXIDINE GLUCONATE CLOTH 2 % EX PADS
6.0000 | MEDICATED_PAD | Freq: Every day | CUTANEOUS | Status: DC
  Administered 2019-11-28 – 2019-12-04 (×5): 6 via TOPICAL

## 2019-11-27 MED ORDER — LEVOTHYROXINE SODIUM 25 MCG PO TABS
25.0000 ug | ORAL_TABLET | Freq: Every day | ORAL | Status: DC
  Administered 2019-11-28: 25 ug via ORAL
  Filled 2019-11-27: qty 1

## 2019-11-27 MED ORDER — POTASSIUM CHLORIDE 10 MEQ/100ML IV SOLN
10.0000 meq | INTRAVENOUS | Status: AC
  Administered 2019-11-27 (×4): 10 meq via INTRAVENOUS
  Filled 2019-11-27 (×4): qty 100

## 2019-11-27 MED ORDER — MAGNESIUM SULFATE IN D5W 1-5 GM/100ML-% IV SOLN
1.0000 g | Freq: Once | INTRAVENOUS | Status: AC
  Administered 2019-11-27: 1 g via INTRAVENOUS
  Filled 2019-11-27: qty 100

## 2019-11-27 MED ORDER — MIDODRINE HCL 5 MG PO TABS
5.0000 mg | ORAL_TABLET | Freq: Three times a day (TID) | ORAL | Status: DC
  Administered 2019-11-28 – 2019-12-01 (×11): 5 mg via ORAL
  Filled 2019-11-27 (×13): qty 1

## 2019-11-27 MED ORDER — KCL IN DEXTROSE-NACL 40-5-0.45 MEQ/L-%-% IV SOLN
INTRAVENOUS | Status: DC
  Filled 2019-11-27 (×4): qty 1000

## 2019-11-27 NOTE — TOC Initial Note (Signed)
Transition of Care [facility]) - Initial/Assessment Note    Patient Details  Name: Jessica [name] MRN: [id number] Date of Birth: 1946-03-10  Transition of Care [facility]) CM/SW Contact:    Marina Goodell Phone Number: [phone] 11/27/2019, 5:38 PM  Clinical Narrative:                  Pt presents to [facility] ED due to Altered Mental State.  Patient refrequently comes to the ED sue to medical issues. Patient lies at home with husband and son and they care for her, including cleaning her sacral wound. CSW spoke with patient's husband Rayna Sexton and he stated he was told the patient was going transferred to the ICU.  TOC will follow pending PT/OT recommendation for placement.  Expected Discharge Plan: Skilled Nursing Facility Barriers to Discharge: Continued Medical Work up   Patient Goals and CMS Choice Patient states their goals for this hospitalization and ongoing recovery are:: Patient is unable to communicate, CSw spoke to the patient's husband Rayna Sexton [email or web address] Compare Post Acute Care list provided to:: Patient Represenative (must comment) Jessica [name] (Spouse) [phone]) Choice offered to / list presented to : Patient  Expected Discharge Plan and Services Expected Discharge Plan: Skilled Nursing Facility In-house Referral: Clinical Social Work   Post Acute Care Choice: NA Living arrangements for the past 2 months: Single Family Home                                      Prior Living Arrangements/Services Living arrangements for the past 2 months: Single Family Home Lives with:: Spouse, Adult Children Patient language and need for interpreter reviewed:: Yes        Need for Family Participation in Patient Care: Yes (Comment) Care giver support system in place?: Yes (comment)   Criminal Activity/Legal Involvement Pertinent to Current Situation/Hospitalization: No - Comment as needed  Activities of Daily Living      Permission Sought/Granted      Share  Information with NAME: [name], [name] (Spouse) [phone]           Emotional Assessment Appearance:: Appears stated age Attitude/Demeanor/Rapport: Unable to Assess (Pt is not able to communicate) Affect (typically observed): Unable to Assess (Pt is not able to communicate)   Alcohol / Substance Use: Not Applicable Psych Involvement: No (comment)  Admission diagnosis:  Septic shock (HCC) [A41.9, R65.21] Patient Active Problem List   Diagnosis Date Noted  . Septic shock (HCC) 12/06/19  . AKI (acute kidney injury) (HCC)   . Hypomagnesemia   . Acute on chronic diastolic CHF (congestive heart failure) (HCC) 10/03/2019  . Acute respiratory failure with hypoxia (HCC) 10/03/2019  . Sepsis due to Escherichia coli with acute renal failure and septic shock (HCC) 10/01/2019  . UTI (urinary tract infection) due to urinary indwelling catheter (HCC) 10/01/2019  . Pressure injury of skin 09/30/2019  . Severe sepsis with septic shock (HCC) 09/29/2019  . 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 Weight loss 10/12/2017  . Needs peripherally inserted central catheter (PICC) 10/12/2017  . Subacute delirium 01/20/2017  . Decubitus ulcer of sacral region, stage 4 (HCC) 12/19/2016  . Candiduria, asymptomatic  12/18/2016  . Hallucinations 07/06/2016  . Hypokalemia 07/06/2016  . Sinus tachycardia 07/06/2016  . Atelectasis, right 04/27/2016  . Pleural effusion on right 2018  . CONSTIPATION 07/08/2009  . DEEP VENOUS THROMBOPHLEBITIS, BILATERAL 01/07/2009  . Confined to bed 11/22/2008  .  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 HLD (hyperlipidemia) 12/10/2006  . ANXIETY 12/10/2006  . DEPRESSION 12/10/2006  . Essential hypertension 12/10/2006  . OSTEOARTHRITIS 12/10/2006  . Hypothyroidism 09/22/2006  . HYPERCHOLESTEROLEMIA 09/22/2006  . ECZEMA 09/22/2006   PCP:  Annita Brod, MD Pharmacy:   [facility], [place] - [address] [place] [address] Phone:  [phone] Fax: [phone]  [facility] [phone] - [address] [place], [place] - [phone] [place] [address] Phone: [phone] Fax: [phone]  [id number] - [place], [place] - [address] [place] [address] Phone: [phone] Fax: [phone]     Social Determinants of Health (SDOH) Interventions    Readmission Risk Interventions No flowsheet data found.

## 2019-11-27 NOTE — Plan of Care (Signed)
No events overnight.  Was maintained on 1 mcg of norepinephrine overnight.  This has now been weaned off.  She has been cleared for a dysphagia 3 diet.  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  She was started on midodrine.  This is a typical course for her when she gets septic from UTI.  C. difficile was negative.  Urine culture is polymicrobial.  This is going to be an ongoing process given her severe malnutrition, chronic sacral ulcer and chronic bedridden state, requiring indwelling Foley neurogenic bladder.  Consider reengaging palliative care in conversations of goals of care.   PCCM will sign off, please reconsult as needed.   Gailen Shelter, MD [facility]   *This note was dictated using voice recognition software/Dragon.  Despite best efforts to proofread, errors can occur which can change the meaning.  Any change was purely unintentional.

## 2019-11-27 NOTE — Evaluation (Signed)
Clinical/Bedside Swallow Evaluation Patient Details  Name: Jessica [name] MRN: [id number] Date of Birth: 10-Jul-1945  Today's Date: 11/27/2019 Time: SLP Start Time (ACUTE ONLY): 52 SLP Stop Time (ACUTE ONLY): 1520 SLP Time Calculation (min) (ACUTE ONLY): 60 min  Past Medical History:  Past Medical History:  Diagnosis Date   Chronic pain    Depression    Diastolic heart failure (HCC)    HLD (hyperlipidemia)    HTN (hypertension)    Hypothyroidism    Morbid obesity ([place])    Recurrent UTI    Spinal stenosis    Visual hallucinations    Past Surgical History:  Past Surgical History:  Procedure Laterality Date   ABDOMINAL HYSTERECTOMY     BACK SURGERY     HEMORROIDECTOMY     IR RADIOLOGIST EVAL & MGMT  09/01/2017   HPI:  Per admitting H and P "has a past medical history of Chronic pain, Depression, Diastolic heart failure ([place]), HLD (hyperlipidemia), HTN (hypertension), Hypothyroidism, Morbid obesity ([place]), Recurrent UTI, Spinal stenosis, and Visual hallucinations.   Assessment / Plan / Recommendation Clinical Impression  Pt presents with mild-moderate risk for aspiration secondary to deconditioning and overall lethargy/weakness. Pt was able to awaken easily but needed cues to stay awake and fatigued quickly. Oral mech exam revealed upper dentures only, which had not been removed for a few days per Pt's husband. ST removed dentures from Mrs. Overmans mouth and cleaned food particles from them before replacing them back in her mouth as requested by Pt and her husband. Today, Pt tolerated all consistencies tested without overt s/s of aspiration, however, presents with increased risk of aspiration with the thinner consistencies as she was noted to hold them for several seconds prior to swallowing. Given a bite of graham cracker, Pt needed extended time to chew and several subsequent boluses of nectar thick liquid prior to clearing her oral cavity of the graham cracker. Pt  tolerated applesauce and nectar thick liquids BY SPOON well. Vocal quality remained clear but weak throughout todays assessment. Pt's husband was present and very supportive during the assessment. He expressed some dissatisfaction with previous care his wife received while at [facility] and had some concerns that's his wife would not receive the care she is used to getting at home. He intends to help feed, turn and care for his wife while she is here along with the help of their son. ST made sure all needs were met prior to leaving the room including repositioning. Aspiration training provided to Pt's husband since he plans to feed his wife. Rec Dysphagia 1 diet with nectar thick liquids to be given BY SPOON. St to f/u 1-2 days for possible diet upgrade if overall status continues to improve. SLP Visit Diagnosis: Dysphagia, oropharyngeal phase (R13.12)    Aspiration Risk  Mild aspiration risk;Moderate aspiration risk    Diet Recommendation Dysphagia 1 (Puree);Nectar-thick liquid   Medication Administration: Crushed with puree Compensations: Slow rate;Small sips/bites;Minimize environmental distractions Postural Changes: Seated upright at 90 degrees;Remain upright for at least 30 minutes after po intake    Other  Recommendations     Follow up Recommendations  May need modified diet      Frequency and Duration min 2x/week  1 week       Prognosis Prognosis for Safe Diet Advancement: [place] Date of Onset: 12/08/2019 HPI: Per admitting H and P "has a past medical history of Chronic pain, Depression, Diastolic heart failure ([place]),  HLD (hyperlipidemia), HTN (hypertension), Hypothyroidism, Morbid obesity ([place]), Recurrent UTI, Spinal stenosis, and Visual hallucinations. Type of Study: Bedside Swallow Evaluation Diet Prior to this Study: NPO Respiratory Status: Nasal cannula History of Recent Intubation: No Behavior/Cognition: Cooperative;Confused;Lethargic/Drowsy Oral  Cavity Assessment: Within Functional Limits Oral Care Completed by SLP: Yes Oral Cavity - Dentition: Dentures, top Self-Feeding Abilities: Needs assist Patient Positioning: Upright in bed Baseline Vocal Quality: Breathy;Low vocal intensity    Oral/Motor/Sensory Function     Ice Chips Ice chips: Within functional limits Presentation: Spoon   Thin Liquid Thin Liquid: Impaired Presentation: Straw;Spoon Oral Phase Impairments: Reduced labial seal Oral Phase Functional Implications: Prolonged oral transit Pharyngeal  Phase Impairments: Suspected delayed Swallow    Nectar Thick Nectar Thick Liquid: Within functional limits Presentation: Spoon;Cup   Honey Thick Honey Thick Liquid: Not tested   Puree Puree: Within functional limits Presentation: Spoon   Solid     Solid: Impaired Oral Phase Impairments: Impaired mastication;Reduced lingual movement/coordination Oral Phase Functional Implications: Prolonged oral transit;Impaired mastication;Oral residue      Lucila Maine 11/27/2019,4:09 PM

## 2019-11-27 NOTE — ED Notes (Signed)
Son stepped out for air, given 2 sprite as requested with ICE pt denies needs or pain att

## 2019-11-27 NOTE — Progress Notes (Addendum)
[employer] Note  Reported patient with MEWS 6 - elevated heart rate and borderline BP Noted patient with sepsis from wounds. On midodrine baseline for low BP Also noted low blood glucose on am labs and elevated TSH IVF D51/2 NS CBG now 108 - monitoring now ACHS bilaetral proximal forearm  with fluid accumulation from IV infiltrations - per nurs assess. Iv right forearm with blood return and flushes easily. IV fluid infusion resumed. Also noted patient with elevated TSH on AM labs Free t 4 added, borderline low  - levthyroxine to start in am. Hypothyroidism could be huge contributing factor to poor wound healing and repeated infections.Also could explain her chronic hypotension.  Due to need for reliable IV access PICC ordered  Spouse updated at bedside. Questions addressed. Sepsis explained. Purpose of PICC line understood

## 2019-11-27 NOTE — Progress Notes (Signed)
PROGRESS NOTE  Jessica [name] WER:[id number] DOB: May 27, 1945 DOA: 12/02/2019 PCP: Annita Brod, MD  HPI/Recap of past 24 hours: Jessica [name] is an 74 y.o. female with hx of spinal stenosis - bed-bound X 15 yrs, psychotic depression, neurogenic bladder, recurrent UTI, obesity, frequent hospitalizations since 2018.  Previously with suspected PNA, pleural effusion, pericardial effusion in 2018.  Effusion and atelectasis, and go.  Has chronic stage IV sacral decubitus ulcer and recurrent UTIs.  Right atelectasis/pleural effusion are chronic due to chronic bedridden status.  Prior work-up did not reveal infectious cause.  Presented this time with encephalopathy, gram-negative sepsis likely due to urinary tract infection.  PCCM was asked to admit.  Patient responded to volume resuscitation and to low-dose pressors.  She did not require intubation and mechanical ventilation.  She is currently on low-flow O2 saturating at 100%.  No respiratory distress noted.  She responds that she is "fine" when asked how she is.  Speaking in monosyllables with a depressed affect.  TRH assumed care on 11/27/19:  11/27/19: Seen and examined.  She is alert oriented x3.  Hypoglycemic this morning, treated.     Assessment/Plan: Active Problems:   Septic shock (HCC)   Atelectasis, right   Pleural effusion on right  Resolved acute metabolic encephalopathy likely secondary to sepsis Underlying history of psychotic depression Due to severe sepsis from gram-negative's Aggravated by chronic bedridden status/malnutrition This morning she is alert oriented x3.  Severe sepsis/septic shock, septic shock has resolved now off vasopressors Klebsiella pneumoniae bacteremia Proteus mirabilis/Citrobacter freundii UTI Community-acquired pneumonia Chronic indwelling Foley due to spinal stenosis/paraplegia Other potential source: Sacral decubitus Continue ceftriaxone Discontinue vancomycin if not indicated by  cultures  Stage IV sacral decubitus Continue local wound care Appreciate WOC nurse's input  CT abdomen with possible colitis infectious versus other Anasarca due to malnutrition C. difficile PCR negative  Community-acquired pneumonia, POA Elevated procalcitonin, downtrending Obtain sputum culture, strep urine antigen and Legionella urine antigen Continue empiric IV antibiotics  Severe protein calorie malnutrition Prealbumin less than 5 Severe hypoalbuminemia Low muscle mass This issue adds complexity to her management Exceedingly poor prognostic sign Will not allow for healing skin issues Dietary consult  Dysphagia Speech therapist assessment Appreciate assistance Aspiration precautions  Hypoglycemia Serum glucose in the 60s this morning, treated Encourage oral intake as tolerated with aspiration precautions  Pressure ulcers of skin Present POA WOC nurse consult, appreciate input Cleansing and dressing per WOC protocol Turning side to side per protocol HOB at 30 degree angle or less as tolerated Heels to be placed on Prevalon boots     Code Status: Full code  Family Communication: Updated her son at bedside     Consultants:  PCCM  Procedures:  None  Antimicrobials:  Rocephin  DVT prophylaxis: Subcu Lovenox  Status is: Inpatient    Dispo:  Patient From: Home  Planned Disposition: Home with Health Care Svc  Expected discharge date: 11/29/19  Medically stable for discharge: No, ongoing treatment of bacteremia.         Objective: Vitals:   11/27/19 1515 11/27/19 1530 11/27/19 1545 11/27/19 1600  BP: 127/63 (!) 122/55 (!) 115/55 (!) 115/53  Pulse: 100 99 94 97  Resp: 20 (!) 21 20 (!) 29  Temp:      TempSrc:      SpO2: 100% 100% 100% 100%  Weight:      Height:        Intake/Output Summary (Last 24 hours) at 11/27/2019 1643 Last data filed  at 11/27/2019 0546 Gross per 24 hour  Intake 2148.75 ml  Output 420 ml  Net  1728.75 ml   Filed Weights   12-07-2019 1207  Weight: 97.3 kg    Exam:  . General: 74 y.o. year-old female well developed well nourished in no acute distress.  Alert and oriented x3. . Cardiovascular: Regular rate and rhythm with no rubs or gallops.  No thyromegaly or JVD noted.   Marland Kitchen Respiratory: Mild rales at bases.  No wheezing noted.  Poor inspiratory effort. . Abdomen: Soft nontender nondistended with normal bowel sounds x4 quadrants. . Musculoskeletal: Trace lower extremity edema bilaterally. Marland Kitchen Psychiatry: Mood is appropriate for condition and setting   Data Reviewed: CBC: Recent Labs  Lab December 07, 2019 1021 11/26/19 0846 11/26/19 1837 11/27/19 0550  WBC 28.2* 16.2* 15.4* 12.9*  NEUTROABS 25.1* 13.6*  --  9.7*  HGB 8.8* 9.0* 8.5* 8.6*  HCT 25.5* 26.5* 26.3* 25.0*  MCV 91.1 91.1 93.6 90.9  PLT 229 194 199 188   Basic Metabolic Panel: Recent Labs  Lab 2019-12-07 1021 11/26/19 0846 11/26/19 1837 11/27/19 0550  NA 134* 143  --  138  K 3.7 2.8*  --  3.0*  CL 101 103  --  101  CO2 24 28  --  24  GLUCOSE 99 84  --  68*  BUN 9 8  --  7*  CREATININE 0.77 0.69 0.44 0.59  CALCIUM 6.9* 8.3*  --  7.9*  MG  --  1.7  --  1.7  PHOS  --  3.1  --  2.7   GFR: Estimated Creatinine Clearance: 80.3 mL/min (by C-G formula based on SCr of 0.59 mg/dL). Liver Function Tests: Recent Labs  Lab 12-07-19 1021 11/26/19 0846 11/27/19 0550  AST 123* 76* 61*  ALT 42 33 30  ALKPHOS 157* 141* 126  BILITOT 2.3* 2.6* 2.3*  PROT 4.1* 5.5* 5.2*  ALBUMIN 1.2* 2.8* 2.5*   No results for input(s): LIPASE, AMYLASE in the last 168 hours. No results for input(s): AMMONIA in the last 168 hours. Coagulation Profile: Recent Labs  Lab 12/07/2019 1021  INR 2.0*   Cardiac Enzymes: No results for input(s): CKTOTAL, CKMB, CKMBINDEX, TROPONINI in the last 168 hours. BNP (last 3 results) No results for input(s): PROBNP in the last 8760 hours. HbA1C: No results for input(s): HGBA1C in the last 72  hours. CBG: Recent Labs  Lab 11/27/19 0807  GLUCAP 64*   Lipid Profile: No results for input(s): CHOL, HDL, LDLCALC, TRIG, CHOLHDL, LDLDIRECT in the last 72 hours. Thyroid Function Tests: Recent Labs    12/07/19 1021 11/26/19 0846  TSH  --  9.356*  FREET4 1.18*  --    Anemia Panel: No results for input(s): VITAMINB12, FOLATE, FERRITIN, TIBC, IRON, RETICCTPCT in the last 72 hours. Urine analysis:    Component Value Date/Time   COLORURINE AMBER (A) 12/07/2019 1021   APPEARANCEUR CLOUDY (A) 07-Dec-2019 1021   LABSPEC 1.008 December 07, 2019 1021   PHURINE 6.0 12/07/19 1021   GLUCOSEU NEGATIVE 12-07-2019 1021   HGBUR NEGATIVE 12-07-2019 1021   BILIRUBINUR NEGATIVE Dec 07, 2019 1021   KETONESUR NEGATIVE 12-07-2019 1021   PROTEINUR NEGATIVE 2019/12/07 1021   UROBILINOGEN 0.2 11/20/2008 0939   NITRITE POSITIVE (A) December 07, 2019 1021   LEUKOCYTESUR LARGE (A) 12/07/19 1021   Sepsis Labs: @LABRCNTIP (procalcitonin:4,lacticidven:4)  ) Recent Results (from the past 240 hour(s))  Culture, blood (Routine x 2)     Status: Abnormal (Preliminary result)   Collection Time: 12-07-2019 10:21 AM  Specimen: BLOOD  Result Value Ref Range Status   Specimen Description   Final    BLOOD RIGHT ANTECUBITAL Performed at [facility], [address], [place], [place] [address]    Special Requests   Final    BOTTLES DRAWN AEROBIC AND ANAEROBIC Blood Culture results may not be optimal due to an excessive volume of blood received in culture bottles Performed at [facility], [address] [place], [place] [address]    Culture  Setup Time   Final    Organism ID to follow GRAM NEGATIVE RODS IN BOTH AEROBIC AND ANAEROBIC BOTTLES CRITICAL RESULT CALLED TO, READ BACK BY AND VERIFIED WITH: DAVID BESANTI 11/26/19 AT 0255 HS Performed at [facility], [address] [place], [place] [address]    Culture (A)  Final    KLEBSIELLA PNEUMONIAE SUSCEPTIBILITIES TO FOLLOW Performed at  [facility], [address] [place], [place] [address]    Report Status PENDING  Incomplete  Culture, blood (Routine x 2)     Status: Abnormal (Preliminary result)   Collection Time: 12/05/2019 10:21 AM   Specimen: BLOOD  Result Value Ref Range Status   Specimen Description   Final    BLOOD RH Performed at [facility], [address], [place], [place] [address]    Special Requests   Final    BOTTLES DRAWN AEROBIC AND ANAEROBIC Blood Culture results may not be optimal due to an excessive volume of blood received in culture bottles Performed at [facility], [address], [place], [place] [address]    Culture  Setup Time   Final    GRAM NEGATIVE RODS AEROBIC BOTTLE ONLY CRITICAL VALUE NOTED.  VALUE IS CONSISTENT WITH PREVIOUSLY REPORTED AND CALLED VALUE. Performed at [facility], [address] [place], [place] [address]    Culture KLEBSIELLA PNEUMONIAE (A)  Final   Report Status PENDING  Incomplete  Urine culture     Status: Abnormal (Preliminary result)   Collection Time: 11/29/2019 10:21 AM   Specimen: In/Out Cath Urine  Result Value Ref Range Status   Specimen Description   Final    IN/OUT CATH URINE Performed at [facility], [address] [place], [place] [address]    Special Requests   Final    NONE Performed at [facility], [address] [place], [place] [address]    Culture (A)  Final    >=100,000 COLONIES/mL PROTEUS MIRABILIS 30,000 COLONIES/mL CITROBACTER FREUNDII CULTURE REINCUBATED FOR BETTER GROWTH Performed at [facility], [address], [place], [place] [address]    Report Status PENDING  Incomplete  Blood Culture ID Panel (Reflexed)     Status: Abnormal   Collection Time: 11/20/2019 10:21 AM  Result Value Ref Range Status   Enterococcus faecalis NOT DETECTED NOT DETECTED Final   Enterococcus Faecium NOT DETECTED NOT DETECTED Final   Listeria monocytogenes NOT DETECTED NOT DETECTED Final    Staphylococcus species NOT DETECTED NOT DETECTED Final   Staphylococcus aureus (BCID) NOT DETECTED NOT DETECTED Final   Staphylococcus epidermidis NOT DETECTED NOT DETECTED Final   Staphylococcus lugdunensis NOT DETECTED NOT DETECTED Final   Streptococcus species NOT DETECTED NOT DETECTED Final   Streptococcus agalactiae NOT DETECTED NOT DETECTED Final   Streptococcus pneumoniae NOT DETECTED NOT DETECTED Final   Streptococcus pyogenes NOT DETECTED NOT DETECTED Final   A.calcoaceticus-baumannii NOT DETECTED NOT DETECTED Final   Bacteroides fragilis NOT DETECTED NOT DETECTED Final  Enterobacterales DETECTED (A) NOT DETECTED Final    Comment: Enterobacterales represent a large order of gram negative bacteria, not a single organism. CRITICAL RESULT CALLED TO, READ BACK BY AND VERIFIED WITH: DAVID BESANTI 11/26/19 AT 0255 HS    Enterobacter cloacae complex NOT DETECTED NOT DETECTED Final   Escherichia coli NOT DETECTED NOT DETECTED Final   Klebsiella aerogenes NOT DETECTED NOT DETECTED Final   Klebsiella oxytoca NOT DETECTED NOT DETECTED Final   Klebsiella pneumoniae DETECTED (A) NOT DETECTED Final    Comment: CRITICAL RESULT CALLED TO, READ BACK BY AND VERIFIED WITH: DAVID BESANTI 11/26/19 AT 0255 HS    Proteus species NOT DETECTED NOT DETECTED Final   Salmonella species NOT DETECTED NOT DETECTED Final   Serratia marcescens NOT DETECTED NOT DETECTED Final   Haemophilus influenzae NOT DETECTED NOT DETECTED Final   Neisseria meningitidis NOT DETECTED NOT DETECTED Final   Pseudomonas aeruginosa NOT DETECTED NOT DETECTED Final   Stenotrophomonas maltophilia NOT DETECTED NOT DETECTED Final   Candida albicans NOT DETECTED NOT DETECTED Final   Candida auris NOT DETECTED NOT DETECTED Final   Candida glabrata NOT DETECTED NOT DETECTED Final   Candida krusei NOT DETECTED NOT DETECTED Final   Candida parapsilosis NOT DETECTED NOT DETECTED Final   Candida tropicalis NOT DETECTED NOT DETECTED  Final   Cryptococcus neoformans/gattii NOT DETECTED NOT DETECTED Final   CTX-M ESBL NOT DETECTED NOT DETECTED Final   Carbapenem resistance IMP NOT DETECTED NOT DETECTED Final   Carbapenem resistance KPC NOT DETECTED NOT DETECTED Final   Carbapenem resistance NDM NOT DETECTED NOT DETECTED Final   Carbapenem resist OXA 48 LIKE NOT DETECTED NOT DETECTED Final   Carbapenem resistance VIM NOT DETECTED NOT DETECTED Final    Comment: Performed at [facility], [address] [place], [place] [address]  SARS Coronavirus 2 by RT PCR (hospital order, performed in [facility] hospital lab) Nasopharyngeal Nasopharyngeal Swab     Status: None   Collection Time: 08-Jan-2020 12:39 PM   Specimen: Nasopharyngeal Swab  Result Value Ref Range Status   SARS Coronavirus 2 NEGATIVE NEGATIVE Final    Comment: (NOTE) SARS-CoV-2 target nucleic acids are NOT DETECTED.  The SARS-CoV-2 RNA is generally detectable in upper and lower respiratory specimens during the acute phase of infection. The lowest concentration of SARS-CoV-2 viral copies this assay can detect is 250 copies / mL. A negative result does not preclude SARS-CoV-2 infection and should not be used as the sole basis for treatment or other patient management decisions.  A negative result may occur with improper specimen collection / handling, submission of specimen other than nasopharyngeal swab, presence of viral mutation(s) within the areas targeted by this assay, and inadequate number of viral copies (<250 copies / mL). A negative result must be combined with clinical observations, patient history, and epidemiological information.  Fact Sheet for Patients:   BoilerBrush.com.cyhttps://www.fda.gov/media/136312/download  Fact Sheet for Healthcare Providers: https://pope.com/https://www.fda.gov/media/136313/download  This test is not yet approved or  cleared by the [place] FDA and has been authorized for detection and/or diagnosis of SARS-CoV-2 by FDA under an  Emergency Use Authorization (EUA).  This EUA will remain in effect (meaning this test can be used) for the duration of the COVID-19 declaration under Section 564(b)(1) of the Act, 21 U.S.C. section 360bbb-3(b)(1), unless the authorization is terminated or revoked sooner.  Performed at [facility], [address] [place], [place] [address]   C Difficile Quick Screen w PCR reflex     Status:  None   Collection Time: 11/27/19  6:18 AM   Specimen: STOOL  Result Value Ref Range Status   C Diff antigen NEGATIVE NEGATIVE Final   C Diff toxin NEGATIVE NEGATIVE Final   C Diff interpretation No C. difficile detected.  Final    Comment: Performed at [facility], [address] [place], [place] [address]      Studies: No results found.  Scheduled Meds: . enoxaparin (LOVENOX) injection  40 mg Subcutaneous Q24H  . midodrine  5 mg Oral TID WC  . zinc oxide   Topical BID    Continuous Infusions: . albumin human 12.5 g (11/27/19 1035)  . cefTRIAXone (ROCEPHIN)  IV Stopped (11/27/19 0700)  . dextrose 5 % and 0.45 % NaCl with KCl 40 mEq/L 75 mL/hr at 11/27/19 0848  . famotidine (PEPCID) IV Stopped (11/27/19 1642)  . lactated ringers 75 mL/hr at 11/27/19 0549  . norepinephrine (LEVOPHED) Adult infusion Stopped (11/27/19 1530)  . vancomycin Stopped (11/27/19 0756)     LOS: 2 days     Darlin Drop, MD [facility] Pager [phone]  If 7PM-7AM, please contact night-coverage [email or web address] Password [facility] 11/27/2019, 4:43 PM

## 2019-11-27 NOTE — ED Notes (Signed)
Transport at bedside with this patient. Transport tech alerted this RN that patient son at bedside was concerned that transport was taking patient to the wrong floor. Son at bedside refusing to let transport take patient and insisting that patient was supposed to be going to the ICU. This RN entered room and assured patient son and husband at bedside that patient was going to 1A due to levophed recently being discontinued and blood pressures becoming stable. Son at bedside began raising voice at this RN stating "no one is telling us anything. One person tells [place] this and another tells [place] this." This RN attempted to explain to patient again why patient was going to a different room and also that this RN had just taken over care, but could look and try to answer questions to the best of my ability. Son increasingly raising voice at this RN stating "yall are just going to take my mother to the death floor and leave her there. They did this last time and that's why she has a bedsore that I have been nursing since 2018". This RN attempted to assure patient's son that his mother would be taken care of on the admission floor. Patient's son increasingly raising voice. This RN replied that I would be happy to discuss patient's care but only in a respectful manner. This RN also explained that hospital staff is doing their best to accommodate patient and family needs by allowing 2 visitors for patient at this time as well. Pt son states "Oh I see now I owe you something. Now you act like you did me a favor and I owe you something. Whats your name?!" This RN provided patient with name and who complaints could be voiced to. This RN did not further engage with patient's son to deescalate situation. Pt son and husband followed this RN to admission floor. Bonita Quin, Diplomatic Services operational officer witness to [employer].

## 2019-11-27 NOTE — Progress Notes (Addendum)
PHARMACY CONSULT NOTE - FOLLOW UP  Pharmacy Consult for Electrolyte Monitoring and Replacement   Recent Labs: Potassium (mmol/L)  Date Value  11/27/2019 3.0 (L)   Magnesium (mg/dL)  Date Value  28/76/8115 1.7   Calcium (mg/dL)  Date Value  72/62/0355 7.9 (L)   Albumin (g/dL)  Date Value  97/41/6384 2.5 (L)   Phosphorus (mg/dL)  Date Value  53/64/6803 2.7   Sodium (mmol/L)  Date Value  11/27/2019 138    Corr Ca 9.1  Assessment: 74 year old female with K.pneumo bacteremia. Pharmacy consulted for electrolyte management. CT scan of abdomen consistent with colitis.  MIVF D5W1/2 NS + KCl 83mEq/L @75mL /hr (10mEq/day)   Goal of Therapy:  Electrolytes WNL   Plan:  MD already supplemented with Potassium 10 mEq IV x 5 runs + mag 1 g IV x 1.  Follow up with morning labs.  75m, PharmD Pharmacy Resident  11/27/2019 10:24 AM

## 2019-11-27 NOTE — Consult Note (Signed)
Pharmacy Antibiotic Note  Jessica [name] is a 74 y.o. female admitted on December 03, 2019 with sepsis.  Pharmacy has been consulted for Vancomycin dosing. Patient currently also receiving Rocephin.   BCx growing K. Pneumoniae - awaiting sensitivities UCx has GNR Based on cultures, will d/w provider about the need for vancomycin.  Other potential source: sacral decubitis  Plan: Continue vancomycin 1250mg  Q12 hours -Monitor renal function -Obtain vanc levels as clinically indicated - levels due 8/17 0400 prior to 5th dose  Height: 5\' 10"  (177.8 cm) Weight: 97.3 kg (214 lb 8.1 oz) IBW/kg (Calculated) : 68.5  No data recorded.  Recent Labs  Lab Dec 03, 2019 1021 2019-12-03 1239 12-03-2019 1853 11/26/19 0846 11/26/19 1837 11/27/19 0550  WBC 28.2*  --   --  16.2* 15.4* 12.9*  CREATININE 0.77  --   --  0.69 0.44 0.59  LATICACIDVEN 3.4* 2.8* 2.0*  --  1.1 0.7    Estimated Creatinine Clearance: 80.3 mL/min (by C-G formula based on SCr of 0.59 mg/dL).    Allergies  Allergen Reactions  . Ambien [Zolpidem Tartrate] Other (See Comments)    Reaction: altered mental status  . Zinc Other (See Comments)    Zinc ointments cause skin irritation    Antimicrobials this admission: Vancomycin 8/14 >>  Ceftriaxone 8/14 >>  Cefepime x 1   Microbiology results: 8/14 BCx: K.pneumo 8/14 UCx: >=100,000 COLONIES/mL PROTEUS MIRABILIS  30,000 COLONIES/mL CITROBACTER FREUNDII   Thank you for allowing pharmacy to be a part of this patient's care.  9/14, PharmD Pharmacy Resident  11/27/2019 12:51 PM

## 2019-11-28 ENCOUNTER — Encounter: Payer: Medicare Other | Admitting: Physician Assistant

## 2019-11-28 ENCOUNTER — Encounter: Payer: Self-pay | Admitting: Internal Medicine

## 2019-11-28 ENCOUNTER — Other Ambulatory Visit: Payer: Self-pay

## 2019-11-28 LAB — CBC WITH DIFFERENTIAL/PLATELET
Abs Immature Granulocytes: 0.05 10*3/uL (ref 0.00–0.07)
Basophils Absolute: 0.1 10*3/uL (ref 0.0–0.1)
Basophils Relative: 1 %
Eosinophils Absolute: 0.2 10*3/uL (ref 0.0–0.5)
Eosinophils Relative: 2 %
HCT: 24.6 % — ABNORMAL LOW (ref 36.0–46.0)
Hemoglobin: 8.6 g/dL — ABNORMAL LOW (ref 12.0–15.0)
Immature Granulocytes: 1 %
Lymphocytes Relative: 18 %
Lymphs Abs: 1.8 10*3/uL (ref 0.7–4.0)
MCH: 31.5 pg (ref 26.0–34.0)
MCHC: 35 g/dL (ref 30.0–36.0)
MCV: 90.1 fL (ref 80.0–100.0)
Monocytes Absolute: 0.7 10*3/uL (ref 0.1–1.0)
Monocytes Relative: 8 %
Neutro Abs: 7 10*3/uL (ref 1.7–7.7)
Neutrophils Relative %: 70 %
Platelets: 169 10*3/uL (ref 150–400)
RBC: 2.73 MIL/uL — ABNORMAL LOW (ref 3.87–5.11)
RDW: 16.8 % — ABNORMAL HIGH (ref 11.5–15.5)
WBC: 9.9 10*3/uL (ref 4.0–10.5)
nRBC: 0 % (ref 0.0–0.2)

## 2019-11-28 LAB — PHOSPHORUS: Phosphorus: 2.2 mg/dL — ABNORMAL LOW (ref 2.5–4.6)

## 2019-11-28 LAB — COMPREHENSIVE METABOLIC PANEL
ALT: 35 U/L (ref 0–44)
AST: 84 U/L — ABNORMAL HIGH (ref 15–41)
Albumin: 2.6 g/dL — ABNORMAL LOW (ref 3.5–5.0)
Alkaline Phosphatase: 128 U/L — ABNORMAL HIGH (ref 38–126)
Anion gap: 8 (ref 5–15)
BUN: <5 mg/dL — ABNORMAL LOW (ref 8–23)
CO2: 25 mmol/L (ref 22–32)
Calcium: 8 mg/dL — ABNORMAL LOW (ref 8.9–10.3)
Chloride: 106 mmol/L (ref 98–111)
Creatinine, Ser: 0.52 mg/dL (ref 0.44–1.00)
GFR calc Af Amer: >60 mL/min (ref >60)
GFR calc non Af Amer: >60 mL/min (ref >60)
Glucose, Bld: 116 mg/dL — ABNORMAL HIGH (ref 70–99)
Potassium: 3.7 mmol/L (ref 3.5–5.1)
Sodium: 139 mmol/L (ref 135–145)
Total Bilirubin: 2.1 mg/dL — ABNORMAL HIGH (ref 0.3–1.2)
Total Protein: 5.1 g/dL — ABNORMAL LOW (ref 6.5–8.1)

## 2019-11-28 LAB — GLUCOSE, CAPILLARY
Glucose-Capillary: 108 mg/dL — ABNORMAL HIGH (ref 70–99)
Glucose-Capillary: 126 mg/dL — ABNORMAL HIGH (ref 70–99)
Glucose-Capillary: 140 mg/dL — ABNORMAL HIGH (ref 70–99)
Glucose-Capillary: 111 mg/dL — ABNORMAL HIGH (ref 70–99)

## 2019-11-28 LAB — MAGNESIUM: Magnesium: 2 mg/dL (ref 1.7–2.4)

## 2019-11-28 LAB — CULTURE, BLOOD (ROUTINE X 2)

## 2019-11-28 LAB — MRSA PCR SCREENING: MRSA by PCR: NEGATIVE

## 2019-11-28 LAB — VANCOMYCIN, TROUGH: Vancomycin Tr: 27 ug/mL (ref 15–20)

## 2019-11-28 LAB — PROCALCITONIN: Procalcitonin: 7.29 ng/mL

## 2019-11-28 LAB — ANA W/REFLEX IF POSITIVE: Anti Nuclear Antibody (ANA): NEGATIVE

## 2019-11-28 MED ORDER — ASPIRIN EC 81 MG PO TBEC
81.0000 mg | DELAYED_RELEASE_TABLET | Freq: Every day | ORAL | Status: DC
  Administered 2019-11-28 – 2019-12-04 (×7): 81 mg via ORAL
  Filled 2019-11-28 (×8): qty 1

## 2019-11-28 MED ORDER — KCL IN DEXTROSE-NACL 40-5-0.45 MEQ/L-%-% IV SOLN
INTRAVENOUS | Status: DC
  Filled 2019-11-28: qty 1000

## 2019-11-28 MED ORDER — PROSOURCE PLUS PO LIQD: 30.0000 mL | Freq: Two times a day (BID) | ORAL | Status: DC

## 2019-11-28 MED ORDER — ARIPIPRAZOLE 2 MG PO TABS
2.0000 mg | ORAL_TABLET | Freq: Every day | ORAL | Status: DC
  Administered 2019-11-28 – 2019-12-04 (×7): 2 mg via ORAL
  Filled 2019-11-28 (×9): qty 1

## 2019-11-28 MED ORDER — PREGABALIN 50 MG PO CAPS
100.0000 mg | ORAL_CAPSULE | Freq: Three times a day (TID) | ORAL | Status: DC
  Administered 2019-11-28 – 2019-12-02 (×14): 100 mg via ORAL
  Filled 2019-11-28 (×14): qty 2

## 2019-11-28 MED ORDER — BACLOFEN 10 MG PO TABS
5.0000 mg | ORAL_TABLET | Freq: Three times a day (TID) | ORAL | Status: DC
  Administered 2019-11-28 – 2019-12-04 (×20): 5 mg via ORAL
  Filled 2019-11-28 (×22): qty 0.5

## 2019-11-28 MED ORDER — LEVOTHYROXINE SODIUM 50 MCG PO TABS
150.0000 ug | ORAL_TABLET | Freq: Every day | ORAL | Status: DC
  Administered 2019-11-28 – 2019-12-05 (×8): 150 ug via ORAL
  Filled 2019-11-28 (×8): qty 1

## 2019-11-28 MED ORDER — OCUVITE-LUTEIN PO CAPS
1.0000 | ORAL_CAPSULE | Freq: Every day | ORAL | Status: DC
  Administered 2019-11-28 – 2019-12-04 (×7): 1 via ORAL
  Filled 2019-11-28 (×8): qty 1

## 2019-11-28 MED ORDER — MIDODRINE HCL 5 MG PO TABS: 10.0000 mg | ORAL_TABLET | Freq: Three times a day (TID) | ORAL | Status: DC

## 2019-11-28 MED ORDER — VITAMIN B-12 1000 MCG PO TABS
1000.0000 ug | ORAL_TABLET | Freq: Every day | ORAL | Status: DC
  Administered 2019-11-28 – 2019-12-04 (×7): 1000 ug via ORAL
  Filled 2019-11-28 (×8): qty 1

## 2019-11-28 MED ORDER — DAKINS (1/4 STRENGTH) 0.125 % EX SOLN
1.0000 "application " | Freq: Two times a day (BID) | CUTANEOUS | Status: AC
  Administered 2019-11-28 – 2019-11-30 (×6): 1
  Filled 2019-11-28: qty 473

## 2019-11-28 MED ORDER — POTASSIUM PHOSPHATES 15 MMOLE/5ML IV SOLN
15.0000 mmol | Freq: Once | INTRAVENOUS | Status: AC
  Administered 2019-11-28: 15 mmol via INTRAVENOUS
  Filled 2019-11-28: qty 5

## 2019-11-28 MED ORDER — TRAZODONE HCL 50 MG PO TABS
50.0000 mg | ORAL_TABLET | Freq: Every day | ORAL | Status: DC
  Administered 2019-11-28 – 2019-12-01 (×4): 50 mg via ORAL
  Filled 2019-11-28 (×4): qty 1

## 2019-11-28 MED ORDER — VENLAFAXINE HCL ER 75 MG PO CP24
225.0000 mg | ORAL_CAPSULE | Freq: Every day | ORAL | Status: DC
  Administered 2019-11-28 – 2019-12-04 (×7): 225 mg via ORAL
  Filled 2019-11-28 (×8): qty 3

## 2019-11-28 NOTE — Progress Notes (Signed)
Patient has 2 wounds side by side sacral wounds and one wound on left lower leg.  Patient also has scabbes on an in between the toes of both feet.

## 2019-11-28 NOTE — Progress Notes (Signed)
Initial Nutrition Assessment  DOCUMENTATION CODES:   Obesity unspecified  INTERVENTION:  Vital Cuisine po daily, each supplement provides 520 kcal and 22 grams of protein  Magic cup BID with meals, each supplement provides 290 kcal and 9 grams of protein  ProSource Plus 30 ml po BID, each supplement provides 100 kcal and 15 grams of protein  Ocuvite daily for wound healing (provides zinc, vitamin A, vitamin C, Vitamin E, copper, and selenium)  Patient is a high refeeding risk, continue to monitor  magnesium, potassium, and phosphorus daily as po intake improves.   NUTRITION DIAGNOSIS:   Increased nutrient needs related to wound healing, chronic illness (chronic stage IV sacral decubitus ulcer) as evidenced by estimated needs.    GOAL:   Patient will meet greater than or equal to 90% of their needs    MONITOR:   PO intake, Labs, Weight trends, Supplement acceptance, Skin, I & O's, Diet advancement  REASON FOR ASSESSMENT:   Consult Assessment of nutrition requirement/status  ASSESSMENT:  RD working remotely.  74 year old female with history of multiple UTIs, sacral decubitus ulcer, sacral osteomyelitis, spinal stenosis, hypothyroidism, HTN, dCHF, depression, chronic pain, and visual hallucinations admitted for acute metabolic encephalopathy secondary to severe sepsis related to chronic wounds  Patient is bedridden with chronic indwelling Foley due to spinal stenosis/paraplegia, cultures grew K pneumo bacteremia, CT abdomen consistent with colitis.   RD called pt room this morning, however no one picked up and unable to obtain nutrition history at this time. Per notes, husband is very supportive, active in pt care. He intends to assist with feeding and repositioning pt during admission. SLP following, currently on puree with nectar thick liquids, possible diet upgrade if overall status continues to improve. Will provide vital cuisine supplement on breakfast tray  Magic Cup  on lunch and dinner trays to aid with meeting needs as well as ProSource Plus and Ocuvite to support wound healing. Continue electrolyte monitoring and replacement daily per pharmacy as po intake improves.  Current wt 218.46 lb, actual weight masked by fluid, noted +4 BUE, +2 BLE edema per RN assessment.  Per chart, weights stable 97.3 kg (214.06 lb) over the past 2 months, will use this for estimating needs.  I/Os: +926.6 ml since admit   +1958.9 ml x 24 hrs UOP: 300 ml x 24 hrs Medications reviewed and include: Baclofen, Lyrica, B12 IVPB: Rocephin, Pepcid Potassium Phosphate 15 mmol in D5 once IVF: D5 and NaCl with KCl 40 mEq @ 30 ml/hr  Labs: CBGs 108,108,64 x 24 hrs, BUN <5 (L), P 2.2 (L), K 3.7 (WNL) trended up, Mg 2.0 (WNL) trended up  NUTRITION - FOCUSED PHYSICAL EXAM: Unable to complete at this time, RD working remotely.   Diet Order:   Diet Order            DIET - DYS 1 Room service appropriate? Yes; Fluid consistency: Nectar Thick  Diet effective now                 EDUCATION NEEDS:   No education needs have been identified at this time  Skin:  Skin Assessment: Skin Integrity Issues: Skin Integrity Issues:: Stage II, Stage IV Stage II: Left lower leg Stage IV: sacrum  Last BM:  8/16  Height:   Ht Readings from Last 1 Encounters:  11/28/19 5\' 10"  (1.778 m)    Weight:   Wt Readings from Last 1 Encounters:  11/28/19 99.3 kg    BMI:  Body mass index  is 31.41 kg/m.  Estimated Nutritional Needs:   Kcal:  1900-2100  Protein:  115-125  Fluid:  >/= 1.9 L   Lars Masson, RD, LDN Clinical Nutrition After Hours/Weekend Pager # in Amion

## 2019-11-28 NOTE — Progress Notes (Signed)
Md notified of critical level VANC 27

## 2019-11-28 NOTE — Progress Notes (Signed)
PHARMACY CONSULT NOTE - FOLLOW UP  Pharmacy Consult for Electrolyte Monitoring and Replacement   Recent Labs: Potassium (mmol/L)  Date Value  11/28/2019 3.7   Magnesium (mg/dL)  Date Value  83/15/1761 2.0   Calcium (mg/dL)  Date Value  60/73/7106 8.0 (L)   Albumin (g/dL)  Date Value  26/94/8546 2.6 (L)   Phosphorus (mg/dL)  Date Value  27/06/5007 2.2 (L)   Sodium (mmol/L)  Date Value  11/28/2019 139    Corr Ca 9.1  Assessment: 74 year old female with K.pneumo bacteremia. Pharmacy consulted for electrolyte management. CT scan of abdomen consistent with colitis.  MIVF D5W1/2 NS + KCl 6mEq/L @30mL /hr (decreased from 75 ml/hr on 8/17)  Dysphagia 1 diet   Goal of Therapy:  Electrolytes WNL   Plan:  Will order Potassium phosphate 15 mmol IV x 1 dose. Follow up with morning labs.  9/17 PharmD Clinical Pharmacist 11/28/2019

## 2019-11-28 NOTE — Consult Note (Addendum)
WOC Nurse wound follow up Initial consult performed on 8/15 by a WOC team member. Pt is bedridded and has many systemic factors which can impair healing.  Family requested another consult today to address sacrum and buttocks wounds.  Husband at the bedside states the patient is followed by the outpatient wound care center and they have ordered Dakins solution packing to wounds twice a day.  Informed him that we are limited to ordering this topical treatment to 3 days while in the hospital, related to the potential cytoxic properties of long-term use. I will discontinue the previous plan of care as requested and order Dakins packing BID for 3 days as they are requesting.  Sacrum with chronic stage 4 pressure injury; 6X3X1.5cm, 80% red, 20% yellow, large amt tan foul smelling drainage. Upper buttock wound located nearby; chronic stage 4 pressure injury; 7X4X1.5cm, 80% red, 20% yellow, large amt tan foul smelling drainage. Dark reddish purple moist skin surrounding.  It is difficult to keep the wounds from becoming soiled since the patient continuously drains a clear gelatinous fluid from her rectum, which is located in close proximity to the wounds.  Dressing procedure/placement/frequency: Air mattress ordered to reduce pressure. Topical treatment orders provided for bedside nurses to perform as follows: Change 2 wounds to sacrum/buttocks BID as follows: Apply Dakins-moistened gauze packing to wounds, then cover with ABD pad and tape.  Do not tape over the rectum area. Turn side to side. WOC team will order another topical treatment in 3 days when the course of Dakins has been completed. Pt should continue to follow-up with the outpatient wound care center after discharge and resume previously plan of care.  Cammie Mcgee MSN, RN, CWOCN, [place], CNS [phone]

## 2019-11-28 NOTE — Progress Notes (Signed)
PROGRESS NOTE  [name] NID:[id number] DOB: 09/18/1945 DOA: 12/18/2019 PCP: Annita Brod, MD  HPI/Recap of past 24 hours: Jessica [name] is an 74 y.o. female with hx of spinal stenosis - bed-bound X 15 yrs, psychotic depression, neurogenic bladder, recurrent UTI, obesity, frequent hospitalizations since 2018.  Previously with suspected PNA, pleural effusion, pericardial effusion in 2018.  Has chronic stage IV sacral decubitus ulcer.  Right atelectasis/pleural effusion are chronic due to chronic bedridden status.  Presented this time with encephalopathy and septic shock.  PCCM was asked to admit.  She did not require intubation and mechanical ventilation.  She is currently on 3L O2 saturating at 100%.  No respiratory distress noted.   TRH assumed care on 11/27/19:  11/28/19: Seen and examined with her son and husband at her bedside.  She is alert and oriented x3.  Has a poor appetite.  Her spouse and son requested to be with her so they can also assist with feeding her as she would eat for them.  Son would be with her at night and her spouse would be with her during the day.    Assessment/Plan: Active Problems:   Septic shock (HCC)   Atelectasis, right   Pleural effusion on right  Resolved acute metabolic encephalopathy likely secondary to sepsis with septic shock Due to septic shock from Klebsiella pneumonia bacteremia, community-acquired pneumonia, Proteus mirabilis and Citrobacter F. UTI Aggravated by chronic bedridden status/malnutrition This morning she is alert oriented x3.  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  Stage IV sacral  decubitus Appreciate WOC nurse's input Family requested re-assessment by wound care specialist. Continue local wound care  CT abdomen with possible colitis infectious versus other Anasarca due to malnutrition C. difficile PCR negative Denies any abdominal pain Continue to monitor  Community-acquired pneumonia, POA Elevated procalcitonin, downtrending Obtain sputum culture Continue empiric IV antibiotics  Severe protein calorie malnutrition Prealbumin less than 5 Severe hypoalbuminemia Low muscle mass Dietary consult Continue oral supplement and continue to encourage increase protein calorie intake  Dysphagia Speech therapist recommended dysphagia 1 diet with nectar thick liquid Continue aspiration precautions  Resolved hypoglycemia On D5 half-normal saline KCl at 30 cc/h due to poor oral intake to avoid hypoglycemia Encourage oral intake as tolerated with aspiration precautions  Pressure ulcers of skin Present POA WOC nurse consult, appreciate input Cleansing and dressing per WOC protocol Turning side to side per protocol HOB at 30 degree angle or less as tolerated Heels to be placed on Prevalon boots     Code Status: Full code  Family Communication: Updated her son and husband at bedside     Consultants:  PCCM  Wound care specialist  Procedures:  None  Removal of PICC line 11/28/19  Antimicrobials:  Rocephin  DVT prophylaxis: Subcu Lovenox  Status is: Inpatient    Dispo:  Patient From: Home  Planned Disposition: Home with Health Care Svc  Expected discharge date: 11/30/19  Medically stable for discharge: No, ongoing treatment of bacteremia.         Objective: Vitals:   11/28/19 0348 11/28/19 0600 11/28/19 0739 11/28/19 1155  BP: (!) 125/59  119/64 (!) 104/54  Pulse: (!) 107  (!) 106 99  Resp: 16  17 14   Temp: 97.6 F (36.4  C)  97.8 F (36.6 C) 98.2 F (36.8 C)  TempSrc: Oral  Oral Oral  SpO2: 98%  100% 100%  Weight:   99.3 kg    Height:  5\' 10"  (1.778 m)      Intake/Output Summary (Last 24 hours) at 11/28/2019 1523 Last data filed at 11/28/2019 1300 Gross per 24 hour  Intake 2268.89 ml  Output 300 ml  Net 1968.89 ml   Filed Weights   11/21/2019 1207 11/28/19 0600  Weight: 97.3 kg 99.3 kg    Exam:  . General: 74 y.o. year-old female  Pleasant in no acute distress. Alert and oriented x3.  . Cardiovascular: Regular rate and rhythm no rubs or gallops. 61 Respiratory: Mild rales at bases no wheezing noted.  . Abdomen: Soft nontender normal bowel sounds present.  . Musculoskeletal: Dependent edema in upper and lower extremities bilaterally.  Marland Kitchen Psychiatry: Mood is appropriate for condition and setting.  Data Reviewed: CBC: Recent Labs  Lab 12/11/2019 1021 11/26/19 0846 11/26/19 1837 11/27/19 0550 11/28/19 0412  WBC 28.2* 16.2* 15.4* 12.9* 9.9  NEUTROABS 25.1* 13.6*  --  9.7* 7.0  HGB 8.8* 9.0* 8.5* 8.6* 8.6*  HCT 25.5* 26.5* 26.3* 25.0* 24.6*  MCV 91.1 91.1 93.6 90.9 90.1  PLT 229 194 199 188 169   Basic Metabolic Panel: Recent Labs  Lab 11/18/2019 1021 11/26/19 0846 11/26/19 1837 11/27/19 0550 11/28/19 0412  NA 134* 143  --  138 139  K 3.7 2.8*  --  3.0* 3.7  CL 101 103  --  101 106  CO2 24 28  --  24 25  GLUCOSE 99 84  --  68* 116*  BUN 9 8  --  7* <5*  CREATININE 0.77 0.69 0.44 0.59 0.52  CALCIUM 6.9* 8.3*  --  7.9* 8.0*  MG  --  1.7  --  1.7 2.0  PHOS  --  3.1  --  2.7 2.2*   GFR: Estimated Creatinine Clearance: 81.1 mL/min (by C-G formula based on SCr of 0.52 mg/dL). Liver Function Tests: Recent Labs  Lab 12/10/2019 1021 11/26/19 0846 11/27/19 0550 11/28/19 0412  AST 123* 76* 61* 84*  ALT 42 33 30 35  ALKPHOS 157* 141* 126 128*  BILITOT 2.3* 2.6* 2.3* 2.1*  PROT 4.1* 5.5* 5.2* 5.1*  ALBUMIN 1.2* 2.8* 2.5* 2.6*   No results for input(s): LIPASE, AMYLASE in the last 168 hours. No results for input(s): AMMONIA in the last 168 hours. Coagulation Profile: Recent Labs    Lab 11/29/2019 1021  INR 2.0*   Cardiac Enzymes: No results for input(s): CKTOTAL, CKMB, CKMBINDEX, TROPONINI in the last 168 hours. BNP (last 3 results) No results for input(s): PROBNP in the last 8760 hours. HbA1C: No results for input(s): HGBA1C in the last 72 hours. CBG: Recent Labs  Lab 11/27/19 0807 11/27/19 2154 11/28/19 0741 11/28/19 1154  GLUCAP 64* 108* 108* 126*   Lipid Profile: No results for input(s): CHOL, HDL, LDLCALC, TRIG, CHOLHDL, LDLDIRECT in the last 72 hours. Thyroid Function Tests: Recent Labs    11/26/19 0846 11/27/19 0550  TSH 9.356*  --   FREET4  --  0.64   Anemia Panel: No results for input(s): VITAMINB12, FOLATE, FERRITIN, TIBC, IRON, RETICCTPCT in the last 72 hours. Urine analysis:    Component Value Date/Time   COLORURINE AMBER (A) 11/16/2019 1021   APPEARANCEUR CLOUDY (A) 11/21/2019 1021   LABSPEC 1.008 12/02/2019 1021   PHURINE 6.0 12/06/2019 1021   GLUCOSEU NEGATIVE 11/13/2019 1021  HGBUR NEGATIVE 05-May-2019 1021   BILIRUBINUR NEGATIVE 05-May-2019 1021   KETONESUR NEGATIVE 05-May-2019 1021   PROTEINUR NEGATIVE 05-May-2019 1021   UROBILINOGEN 0.2 11/20/2008 0939   NITRITE POSITIVE (A) 05-May-2019 1021   LEUKOCYTESUR LARGE (A) 05-May-2019 1021   Sepsis Labs: @LABRCNTIP (procalcitonin:4,lacticidven:4)  ) Recent Results (from the past 240 hour(s))  Culture, blood (Routine x 2)     Status: Abnormal   Collection Time: 27-Oct-2019 10:21 AM   Specimen: BLOOD  Result Value Ref Range Status   Specimen Description   Final    BLOOD RIGHT ANTECUBITAL Performed at [facility], [address] [place], [place] [address]    Special Requests   Final    BOTTLES DRAWN AEROBIC AND ANAEROBIC Blood Culture results may not be optimal due to an excessive volume of blood received in culture bottles Performed at [facility], [address] [place], [place] [address]    Culture  Setup Time   Final    Organism ID to follow GRAM NEGATIVE  RODS IN BOTH AEROBIC AND ANAEROBIC BOTTLES CRITICAL RESULT CALLED TO, READ BACK BY AND VERIFIED WITH: DAVID BESANTI 11/26/19 AT 0255 HS Performed at [facility], [address] [place], [place] [address]    Culture KLEBSIELLA PNEUMONIAE (A)  Final   Report Status 11/28/2019 FINAL  Final   Organism ID, Bacteria KLEBSIELLA PNEUMONIAE  Final      Susceptibility   Klebsiella pneumoniae - MIC*    AMPICILLIN RESISTANT Resistant     CEFAZOLIN <=4 SENSITIVE Sensitive     CEFEPIME <=0.12 SENSITIVE Sensitive     CEFTAZIDIME <=1 SENSITIVE Sensitive     CEFTRIAXONE <=0.25 SENSITIVE Sensitive     CIPROFLOXACIN <=0.25 SENSITIVE Sensitive     GENTAMICIN <=1 SENSITIVE Sensitive     IMIPENEM <=0.25 SENSITIVE Sensitive     TRIMETH/SULFA <=20 SENSITIVE Sensitive     AMPICILLIN/SULBACTAM <=2 SENSITIVE Sensitive     PIP/TAZO <=4 SENSITIVE Sensitive     * KLEBSIELLA PNEUMONIAE  Culture, blood (Routine x 2)     Status: Abnormal   Collection Time: 27-Oct-2019 10:21 AM   Specimen: BLOOD  Result Value Ref Range Status   Specimen Description   Final    BLOOD RH Performed at [facility], [address] [place], [place] [address]    Special Requests   Final    BOTTLES DRAWN AEROBIC AND ANAEROBIC Blood Culture results may not be optimal due to an excessive volume of blood received in culture bottles Performed at [facility], [address] [place], [place] [address]    Culture  Setup Time   Final    GRAM NEGATIVE RODS AEROBIC BOTTLE ONLY CRITICAL VALUE NOTED.  VALUE IS CONSISTENT WITH PREVIOUSLY REPORTED AND CALLED VALUE. Performed at [facility], [address] [place], [place] [address]    Culture (A)  Final    KLEBSIELLA PNEUMONIAE SUSCEPTIBILITIES PERFORMED ON PREVIOUS CULTURE WITHIN THE LAST 5 DAYS. Performed at [facility], [address] [place], [place] [address]    Report Status 11/28/2019 FINAL  Final  Urine culture     Status: Abnormal  (Preliminary result)   Collection Time: 27-Oct-2019 10:21 AM   Specimen: In/Out Cath Urine  Result Value Ref Range Status   Specimen Description   Final    IN/OUT CATH URINE Performed at [facility], [address] [place], [place] [address]    Special Requests   Final    NONE Performed at  [facility], [address], [place], [place] [address]    Culture (A)  Final    >=100,000 COLONIES/mL PROTEUS MIRABILIS 30,000 COLONIES/mL CITROBACTER FREUNDII SUSCEPTIBILITIES TO FOLLOW Performed at [facility], [address] [place], [place] [address]    Report Status PENDING  Incomplete  Blood Culture ID Panel (Reflexed)     Status: Abnormal   Collection Time: 11/24/2019 10:21 AM  Result Value Ref Range Status   Enterococcus faecalis NOT DETECTED NOT DETECTED Final   Enterococcus Faecium NOT DETECTED NOT DETECTED Final   Listeria monocytogenes NOT DETECTED NOT DETECTED Final   Staphylococcus species NOT DETECTED NOT DETECTED Final   Staphylococcus aureus (BCID) NOT DETECTED NOT DETECTED Final   Staphylococcus epidermidis NOT DETECTED NOT DETECTED Final   Staphylococcus lugdunensis NOT DETECTED NOT DETECTED Final   Streptococcus species NOT DETECTED NOT DETECTED Final   Streptococcus agalactiae NOT DETECTED NOT DETECTED Final   Streptococcus pneumoniae NOT DETECTED NOT DETECTED Final   Streptococcus pyogenes NOT DETECTED NOT DETECTED Final   A.calcoaceticus-baumannii NOT DETECTED NOT DETECTED Final   Bacteroides fragilis NOT DETECTED NOT DETECTED Final   Enterobacterales DETECTED (A) NOT DETECTED Final    Comment: Enterobacterales represent a large order of gram negative bacteria, not a single organism. CRITICAL RESULT CALLED TO, READ BACK BY AND VERIFIED WITH: DAVID BESANTI 11/26/19 AT 0255 HS    Enterobacter cloacae complex NOT DETECTED NOT DETECTED Final   Escherichia coli NOT DETECTED NOT DETECTED Final   Klebsiella aerogenes NOT DETECTED NOT DETECTED Final    Klebsiella oxytoca NOT DETECTED NOT DETECTED Final   Klebsiella pneumoniae DETECTED (A) NOT DETECTED Final    Comment: CRITICAL RESULT CALLED TO, READ BACK BY AND VERIFIED WITH: DAVID BESANTI 11/26/19 AT 0255 HS    Proteus species NOT DETECTED NOT DETECTED Final   Salmonella species NOT DETECTED NOT DETECTED Final   Serratia marcescens NOT DETECTED NOT DETECTED Final   Haemophilus influenzae NOT DETECTED NOT DETECTED Final   Neisseria meningitidis NOT DETECTED NOT DETECTED Final   Pseudomonas aeruginosa NOT DETECTED NOT DETECTED Final   Stenotrophomonas maltophilia NOT DETECTED NOT DETECTED Final   Candida albicans NOT DETECTED NOT DETECTED Final   Candida auris NOT DETECTED NOT DETECTED Final   Candida glabrata NOT DETECTED NOT DETECTED Final   Candida krusei NOT DETECTED NOT DETECTED Final   Candida parapsilosis NOT DETECTED NOT DETECTED Final   Candida tropicalis NOT DETECTED NOT DETECTED Final   Cryptococcus neoformans/gattii NOT DETECTED NOT DETECTED Final   CTX-M ESBL NOT DETECTED NOT DETECTED Final   Carbapenem resistance IMP NOT DETECTED NOT DETECTED Final   Carbapenem resistance KPC NOT DETECTED NOT DETECTED Final   Carbapenem resistance NDM NOT DETECTED NOT DETECTED Final   Carbapenem resist OXA 48 LIKE NOT DETECTED NOT DETECTED Final   Carbapenem resistance VIM NOT DETECTED NOT DETECTED Final    Comment: Performed at [facility], [address] [place], [place] [address]  SARS Coronavirus 2 by RT PCR (hospital order, performed in [facility] hospital lab) Nasopharyngeal Nasopharyngeal Swab     Status: None   Collection Time: 12/08/2019 12:39 PM   Specimen: Nasopharyngeal Swab  Result Value Ref Range Status   SARS Coronavirus 2 NEGATIVE NEGATIVE Final    Comment: (NOTE) SARS-CoV-2 target nucleic acids are NOT DETECTED.  The SARS-CoV-2 RNA is generally detectable in upper and lower respiratory specimens during the acute phase of infection. The  lowest concentration of SARS-CoV-2 viral copies this assay can detect  is 250 copies / mL. A negative result does not preclude SARS-CoV-2 infection and should not be used as the sole basis for treatment or other patient management decisions.  A negative result may occur with improper specimen collection / handling, submission of specimen other than nasopharyngeal swab, presence of viral mutation(s) within the areas targeted by this assay, and inadequate number of viral copies (<250 copies / mL). A negative result must be combined with clinical observations, patient history, and epidemiological information.  Fact Sheet for Patients:   BoilerBrush.com.cy  Fact Sheet for Healthcare Providers: https://pope.com/  This test is not yet approved or  cleared by the [place] FDA and has been authorized for detection and/or diagnosis of SARS-CoV-2 by FDA under an Emergency Use Authorization (EUA).  This EUA will remain in effect (meaning this test can be used) for the duration of the COVID-19 declaration under Section 564(b)(1) of the Act, 21 U.S.C. section 360bbb-3(b)(1), unless the authorization is terminated or revoked sooner.  Performed at [facility], [address] [place], [place] [address]   C Difficile Quick Screen w PCR reflex     Status: None   Collection Time: 11/27/19  6:18 AM   Specimen: STOOL  Result Value Ref Range Status   C Diff antigen NEGATIVE NEGATIVE Final   C Diff toxin NEGATIVE NEGATIVE Final   C Diff interpretation No C. difficile detected.  Final    Comment: Performed at [facility], [address] [place], [place] [address]  MRSA PCR Screening     Status: None   Collection Time: 11/27/19 11:30 PM   Specimen: Nasal Mucosa; Nasopharyngeal  Result Value Ref Range Status   MRSA by PCR NEGATIVE NEGATIVE Final    Comment:        The GeneXpert MRSA Assay (FDA approved for NASAL  specimens only), is one component of a comprehensive MRSA colonization surveillance program. It is not intended to diagnose MRSA infection nor to guide or monitor treatment for MRSA infections. Performed at [facility], [address] [place], [place] [address]       Studies: [place] EKG SITE RITE  Result Date: 11/27/2019 If [facility] image not attached, placement could not be confirmed due to current cardiac rhythm.   Scheduled Meds: . ARIPiprazole  2 mg Oral Daily  . aspirin EC  81 mg Oral Daily  . baclofen  5 mg Oral TID  . Chlorhexidine Gluconate Cloth  6 each Topical Daily  . enoxaparin (LOVENOX) injection  40 mg Subcutaneous Q24H  . levothyroxine  150 mcg Oral QAC breakfast  . midodrine  5 mg Oral TID WC  . multivitamin-lutein  1 capsule Oral Daily  . pregabalin  100 mg Oral TID  . sodium hypochlorite  1 application Irrigation BID  . traZODone  50 mg Oral QHS  . venlafaxine XR  225 mg Oral Daily  . vitamin B-12  1,000 mcg Oral Daily  . zinc oxide   Topical BID    Continuous Infusions: . cefTRIAXone (ROCEPHIN)  IV 2 g (11/28/19 0342)  . dextrose 5 % and 0.45 % NaCl with KCl 40 mEq/L 30 mL/hr at 11/28/19 1205  . famotidine (PEPCID) IV Stopped (11/27/19 1642)  . potassium PHOSPHATE IVPB (in mmol) 15 mmol (11/28/19 1206)     LOS: 3 days     Darlin Drop, MD [facility] Pager [phone]  If 7PM-7AM, please contact night-coverage [email or web address] Password [facility] 11/28/2019, 3:23 PM

## 2019-11-28 NOTE — Progress Notes (Addendum)
Pt has been on Red MEWS protocol VS, continue VS q4 per protocol. Received call from [facility] Team about the request made for PICC line insertion. MD was notified if PICC was in needed now since pt has 3 working IV and family were hesitant in putting PICC line too. Dr. Margo Aye informed RN to DC the PICC line order as long as there are 2 working IV. PICC Team was called by was busy. Bilateral upper extremities were elevated with pillows. Dried blood was noted in the nasal, probably from dried air of O2, humidifier was placed.  SCD was placed per order. New foley was inserted with help of NT, to monitor urine output. Air mattress was requested per wound RN recommendation, waiting for bed.

## 2019-11-28 NOTE — Progress Notes (Signed)
Spoke to Primary RN Evon Slack and made aware  that PICC will be done tonight.Pt has working PIV.

## 2019-11-29 DIAGNOSIS — L89304 Pressure ulcer of unspecified buttock, stage 4: Secondary | ICD-10-CM

## 2019-11-29 DIAGNOSIS — R000 Tachycardia, unspecified: Secondary | ICD-10-CM

## 2019-11-29 DIAGNOSIS — G9341 Metabolic encephalopathy: Secondary | ICD-10-CM

## 2019-11-29 DIAGNOSIS — J9601 Acute respiratory failure with hypoxia: Secondary | ICD-10-CM

## 2019-11-29 DIAGNOSIS — A414 Sepsis due to anaerobes: Secondary | ICD-10-CM

## 2019-11-29 DIAGNOSIS — A4159 Other Gram-negative sepsis: Secondary | ICD-10-CM

## 2019-11-29 DIAGNOSIS — E43 Unspecified severe protein-calorie malnutrition: Secondary | ICD-10-CM

## 2019-11-29 LAB — COMPREHENSIVE METABOLIC PANEL
ALT: 41 U/L (ref 0–44)
AST: 104 U/L — ABNORMAL HIGH (ref 15–41)
Albumin: 2.2 g/dL — ABNORMAL LOW (ref 3.5–5.0)
Alkaline Phosphatase: 126 U/L (ref 38–126)
Anion gap: 4 — ABNORMAL LOW (ref 5–15)
BUN: <5 mg/dL — ABNORMAL LOW (ref 8–23)
CO2: 27 mmol/L (ref 22–32)
Calcium: 7.9 mg/dL — ABNORMAL LOW (ref 8.9–10.3)
Chloride: 109 mmol/L (ref 98–111)
Creatinine, Ser: 0.35 mg/dL — ABNORMAL LOW (ref 0.44–1.00)
GFR calc Af Amer: >60 mL/min (ref >60)
GFR calc non Af Amer: >60 mL/min (ref >60)
Glucose, Bld: 93 mg/dL (ref 70–99)
Potassium: 4 mmol/L (ref 3.5–5.1)
Sodium: 140 mmol/L (ref 135–145)
Total Bilirubin: 2.1 mg/dL — ABNORMAL HIGH (ref 0.3–1.2)
Total Protein: 4.7 g/dL — ABNORMAL LOW (ref 6.5–8.1)

## 2019-11-29 LAB — CBC WITH DIFFERENTIAL/PLATELET
Abs Immature Granulocytes: 0.06 10*3/uL (ref 0.00–0.07)
Basophils Absolute: 0.1 10*3/uL (ref 0.0–0.1)
Basophils Relative: 1 %
Eosinophils Absolute: 0.2 10*3/uL (ref 0.0–0.5)
Eosinophils Relative: 2 %
HCT: 27.5 % — ABNORMAL LOW (ref 36.0–46.0)
Hemoglobin: 9 g/dL — ABNORMAL LOW (ref 12.0–15.0)
Immature Granulocytes: 1 %
Lymphocytes Relative: 25 %
Lymphs Abs: 2.8 10*3/uL (ref 0.7–4.0)
MCH: 30.4 pg (ref 26.0–34.0)
MCHC: 32.7 g/dL (ref 30.0–36.0)
MCV: 92.9 fL (ref 80.0–100.0)
Monocytes Absolute: 1.2 10*3/uL — ABNORMAL HIGH (ref 0.1–1.0)
Monocytes Relative: 10 %
Neutro Abs: 7 10*3/uL (ref 1.7–7.7)
Neutrophils Relative %: 61 %
Platelets: 180 10*3/uL (ref 150–400)
RBC: 2.96 MIL/uL — ABNORMAL LOW (ref 3.87–5.11)
RDW: 16.5 % — ABNORMAL HIGH (ref 11.5–15.5)
WBC: 11.3 10*3/uL — ABNORMAL HIGH (ref 4.0–10.5)
nRBC: 0 % (ref 0.0–0.2)

## 2019-11-29 LAB — MAGNESIUM: Magnesium: 1.9 mg/dL (ref 1.7–2.4)

## 2019-11-29 LAB — URINE CULTURE: Culture: >=100000 — AB

## 2019-11-29 LAB — GLUCOSE, CAPILLARY
Glucose-Capillary: 101 mg/dL — ABNORMAL HIGH (ref 70–99)
Glucose-Capillary: 116 mg/dL — ABNORMAL HIGH (ref 70–99)
Glucose-Capillary: 119 mg/dL — ABNORMAL HIGH (ref 70–99)
Glucose-Capillary: 112 mg/dL — ABNORMAL HIGH (ref 70–99)

## 2019-11-29 LAB — PHOSPHORUS: Phosphorus: 2.4 mg/dL — ABNORMAL LOW (ref 2.5–4.6)

## 2019-11-29 LAB — PROCALCITONIN: Procalcitonin: 4.23 ng/mL

## 2019-11-29 MED ORDER — K PHOS MONO-SOD PHOS DI & MONO 155-852-130 MG PO TABS
250.0000 mg | ORAL_TABLET | Freq: Two times a day (BID) | ORAL | Status: AC
  Administered 2019-11-29 (×2): 250 mg via ORAL
  Filled 2019-11-29 (×2): qty 1

## 2019-11-29 MED ORDER — METOPROLOL SUCCINATE ER 25 MG PO TB24
25.0000 mg | ORAL_TABLET | Freq: Every evening | ORAL | Status: DC
  Administered 2019-11-29 – 2019-11-30 (×2): 25 mg via ORAL
  Filled 2019-11-29 (×2): qty 1

## 2019-11-29 NOTE — Progress Notes (Signed)
Patient scored a yellow MEWS on day shift, however MEWS was not initiated neither was report received on patient from RN. Attending  to critical patient occurrence on floor when stepped out to take reported, was notified that RN had already left. Assumed care of patient ~1937 pm, without having received report.

## 2019-11-29 NOTE — Progress Notes (Signed)
   11/28/19 2126  Assess: MEWS Score  Level of Consciousness Alert  Assess: MEWS Score  MEWS Temp 0  MEWS Systolic 0  MEWS Pulse 2  MEWS RR 0  MEWS LOC 0  MEWS Score 2  MEWS Score Color Yellow  Assess: if the MEWS score is Yellow or Red  Were vital signs taken at a resting state? Yes  Focused Assessment No change from prior assessment  Early Detection of Sepsis Score *See Row Information* Medium  MEWS guidelines implemented *See Row Information* No, previously yellow, continue vital signs every 4 hours     11/28/19 2126  Assess: MEWS Score  Level of Consciousness Alert  Assess: MEWS Score  MEWS Temp 0  MEWS Systolic 0  MEWS Pulse 2  MEWS RR 0  MEWS LOC 0  MEWS Score 2  MEWS Score Color Yellow  Assess: if the MEWS score is Yellow or Red  Were vital signs taken at a resting state? Yes  Focused Assessment No change from prior assessment  Early Detection of Sepsis Score *See Row Information* Medium  MEWS guidelines implemented *See Row Information* No, previously yellow, continue vital signs every 4 hours

## 2019-11-29 NOTE — Progress Notes (Signed)
PHARMACY CONSULT NOTE - FOLLOW UP  Pharmacy Consult for Electrolyte Monitoring and Replacement   Recent Labs: Potassium (mmol/L)  Date Value  11/29/2019 4.0   Magnesium (mg/dL)  Date Value  53/20/2334 1.9   Calcium (mg/dL)  Date Value  35/68/6168 7.9 (L)   Albumin (g/dL)  Date Value  37/29/0211 2.2 (L)   Phosphorus (mg/dL)  Date Value  15/52/0802 2.4 (L)   Sodium (mmol/L)  Date Value  11/29/2019 140    Corr Ca 9.34  Assessment: 74 year old female with K.pneumo bacteremia. Pharmacy consulted for electrolyte management. CT scan of abdomen consistent with colitis.  MIVF D5W1/2 NS + KCl 48mEq/L @30mL /hr (decreased from 75 ml/hr on 8/17)  Dysphagia 1 diet   Goal of Therapy:  Electrolytes WNL   Plan:  Will order PO potassium phosphate 250mg  BID x2 doses Follow up with morning labs.  9/17, PharmD Pharmacy Resident  11/29/2019 9:17 AM

## 2019-11-29 NOTE — Care Management Important Message (Signed)
Important Message  Patient Details  Name: DONALDA JOB MRN: [id number] Date of Birth: 22-Oct-1945   Medicare Important Message Given:  Yes     Bernadette Hoit 11/29/2019, 11:44 AM

## 2019-11-29 NOTE — Progress Notes (Signed)
Pt has been stable, still on Yellow MEWS for high HR, continue VS q4. Reposition pt multiple times, bath and dressing on sacral area was done. Husband stayed the whole day with multiple question on plan of care, addressed and MD was involved. Pt had congestion, MD was notified.

## 2019-11-29 NOTE — Progress Notes (Signed)
Patient ID: Jessica [name], female   DOB: [birth year], 74 y.o.   MRN: [id number] [facility] PROGRESS NOTE  Jessica [name] EQA:[id number] DOB: 05-27-1945 DOA: 12/10/2019 PCP: Annita Brod, MD  HPI/Subjective: Patient interested in going home.  Husband is nervous about that. Patient has 2 large decubitus ulcers on her buttock.  Patient admitted initially with acute hypoxic respiratory failure and sepsis.  Objective: Vitals:   11/29/19 0749 11/29/19 1106  BP: (!) 127/47 (!) 126/55  Pulse: (!) 120 (!) 108  Resp: 15   Temp: 98.5 F (36.9 C) 98.7 F (37.1 C)  SpO2: 98% 96%    Intake/Output Summary (Last 24 hours) at 11/29/2019 1519 Last data filed at 11/29/2019 1024 Gross per 24 hour  Intake 120 ml  Output 750 ml  Net -630 ml   Filed Weights   11/21/2019 1207 11/28/19 0600  Weight: 97.3 kg 99.3 kg    ROS: Review of Systems  Respiratory: Negative for shortness of breath.   Cardiovascular: Negative for chest pain.  Gastrointestinal: Negative for abdominal pain.   Exam: Physical Exam HENT:     Nose: No mucosal edema.     Mouth/Throat:     Pharynx: No oropharyngeal exudate.  Eyes:     General: Lids are normal.     Conjunctiva/sclera: Conjunctivae normal.     Pupils: Pupils are equal, round, and reactive to light.  Cardiovascular:     Rate and Rhythm: Regular rhythm. Tachycardia present.     Heart sounds: Normal heart sounds, S1 normal and S2 normal.  Pulmonary:     Breath sounds: No decreased breath sounds, wheezing, rhonchi or rales.  Abdominal:     Palpations: Abdomen is soft.     Tenderness: There is no abdominal tenderness.  Musculoskeletal:     Right forearm: Swelling present.     Left forearm: Swelling present.  Skin:    General: Skin is warm.     Comments: 2 stage IV decubitus ulcers on buttock  Neurological:     Mental Status: She is alert and oriented to person, place, and time.       Data Reviewed: Basic Metabolic Panel: Recent Labs  Lab  11/24/2019 1021 11/13/2019 1021 11/26/19 0846 11/26/19 1837 11/27/19 0550 11/28/19 0412 11/29/19 0418 11/29/19 0811  NA 134*  --  143  --  138 139 140  --   K 3.7  --  2.8*  --  3.0* 3.7 4.0  --   CL 101  --  103  --  101 106 109  --   CO2 24  --  28  --  24 25 27   --   GLUCOSE 99  --  84  --  68* 116* 93  --   BUN 9  --  8  --  7* <5* <5*  --   CREATININE 0.77   < > 0.69 0.44 0.59 0.52 0.35*  --   CALCIUM 6.9*  --  8.3*  --  7.9* 8.0* 7.9*  --   MG  --   --  1.7  --  1.7 2.0  --  1.9  PHOS  --   --  3.1  --  2.7 2.2*  --  2.4*   < > = values in this interval not displayed.   Liver Function Tests: Recent Labs  Lab 11/20/2019 1021 11/26/19 0846 11/27/19 0550 11/28/19 0412 11/29/19 0418  AST 123* 76* 61* 84* 104*  ALT 42 33 30 35 41  ALKPHOS 157*  141* 126 128* 126  BILITOT 2.3* 2.6* 2.3* 2.1* 2.1*  PROT 4.1* 5.5* 5.2* 5.1* 4.7*  ALBUMIN 1.2* 2.8* 2.5* 2.6* 2.2*   CBC: Recent Labs  Lab 11-26-19 1021 November 26, 2019 1021 11/26/19 0846 11/26/19 1837 11/27/19 0550 11/28/19 0412 11/29/19 0418  WBC 28.2*   < > 16.2* 15.4* 12.9* 9.9 11.3*  NEUTROABS 25.1*  --  13.6*  --  9.7* 7.0 7.0  HGB 8.8*   < > 9.0* 8.5* 8.6* 8.6* 9.0*  HCT 25.5*   < > 26.5* 26.3* 25.0* 24.6* 27.5*  MCV 91.1   < > 91.1 93.6 90.9 90.1 92.9  PLT 229   < > 194 199 188 169 180   < > = values in this interval not displayed.    CBG: Recent Labs  Lab 11/28/19 1154 11/28/19 1655 11/28/19 2047 11/29/19 0749 11/29/19 1154  GLUCAP 126* 140* 111* 101* 116*    Recent Results (from the past 240 hour(s))  Culture, blood (Routine x 2)     Status: Abnormal   Collection Time: 11-26-2019 10:21 AM   Specimen: BLOOD  Result Value Ref Range Status   Specimen Description   Final    BLOOD RIGHT ANTECUBITAL Performed at [facility], [address] [place], [place] [address]    Special Requests   Final    BOTTLES DRAWN AEROBIC AND ANAEROBIC Blood Culture results may not be optimal due to an excessive volume of  blood received in culture bottles Performed at [facility], [address] [place], [place] [address]    Culture  Setup Time   Final    Organism ID to follow GRAM NEGATIVE RODS IN BOTH AEROBIC AND ANAEROBIC BOTTLES CRITICAL RESULT CALLED TO, READ BACK BY AND VERIFIED WITH: DAVID BESANTI 11/26/19 AT 0255 HS Performed at [facility], [address] [place], [place] [address]    Culture KLEBSIELLA PNEUMONIAE (A)  Final   Report Status 11/28/2019 FINAL  Final   Organism ID, Bacteria KLEBSIELLA PNEUMONIAE  Final      Susceptibility   Klebsiella pneumoniae - MIC*    AMPICILLIN RESISTANT Resistant     CEFAZOLIN <=4 SENSITIVE Sensitive     CEFEPIME <=0.12 SENSITIVE Sensitive     CEFTAZIDIME <=1 SENSITIVE Sensitive     CEFTRIAXONE <=0.25 SENSITIVE Sensitive     CIPROFLOXACIN <=0.25 SENSITIVE Sensitive     GENTAMICIN <=1 SENSITIVE Sensitive     IMIPENEM <=0.25 SENSITIVE Sensitive     TRIMETH/SULFA <=20 SENSITIVE Sensitive     AMPICILLIN/SULBACTAM <=2 SENSITIVE Sensitive     PIP/TAZO <=4 SENSITIVE Sensitive     * KLEBSIELLA PNEUMONIAE  Culture, blood (Routine x 2)     Status: Abnormal   Collection Time: 2019/11/26 10:21 AM   Specimen: BLOOD  Result Value Ref Range Status   Specimen Description   Final    BLOOD RH Performed at [facility], [address] [place], [place] [address]    Special Requests   Final    BOTTLES DRAWN AEROBIC AND ANAEROBIC Blood Culture results may not be optimal due to an excessive volume of blood received in culture bottles Performed at [facility], [address] [place], [place] [address]    Culture  Setup Time   Final    GRAM NEGATIVE RODS AEROBIC BOTTLE ONLY CRITICAL VALUE NOTED.  VALUE IS CONSISTENT WITH PREVIOUSLY REPORTED AND CALLED VALUE. Performed at [facility], [address] [place], [place] [address]    Culture (A)  Final    KLEBSIELLA PNEUMONIAE SUSCEPTIBILITIES PERFORMED ON PREVIOUS  CULTURE WITHIN THE LAST 5 DAYS. Performed at [facility], [address] [place], [place] [address]    Report Status 11/28/2019 FINAL  Final  Urine culture     Status: Abnormal   Collection Time: 2019-11-30 10:21 AM   Specimen: In/Out Cath Urine  Result Value Ref Range Status   Specimen Description   Final    IN/OUT CATH URINE Performed at [facility], [address] [place], [place] [address]    Special Requests   Final    NONE Performed at [facility], [address] [place], [place] [address]    Culture (A)  Final    >=100,000 COLONIES/mL PROTEUS MIRABILIS 30,000 COLONIES/mL CITROBACTER FREUNDII    Report Status 11/29/2019 FINAL  Final   Organism ID, Bacteria PROTEUS MIRABILIS (A)  Final   Organism ID, Bacteria CITROBACTER FREUNDII (A)  Final      Susceptibility   Citrobacter freundii - MIC*    CEFAZOLIN >=64 RESISTANT Resistant     CEFTRIAXONE <=0.25 SENSITIVE Sensitive     CIPROFLOXACIN <=0.25 SENSITIVE Sensitive     GENTAMICIN <=1 SENSITIVE Sensitive     IMIPENEM 0.5 SENSITIVE Sensitive     NITROFURANTOIN <=16 SENSITIVE Sensitive     TRIMETH/SULFA <=20 SENSITIVE Sensitive     PIP/TAZO <=4 SENSITIVE Sensitive     * 30,000 COLONIES/mL CITROBACTER FREUNDII   Proteus mirabilis - MIC*    AMPICILLIN >=32 RESISTANT Resistant     CEFAZOLIN >=64 RESISTANT Resistant     CEFTRIAXONE 32 RESISTANT Resistant     CIPROFLOXACIN >=4 RESISTANT Resistant     GENTAMICIN >=16 RESISTANT Resistant     IMIPENEM 2 SENSITIVE Sensitive     NITROFURANTOIN 128 RESISTANT Resistant     TRIMETH/SULFA >=320 RESISTANT Resistant     AMPICILLIN/SULBACTAM >=32 RESISTANT Resistant     PIP/TAZO <=4 SENSITIVE Sensitive     * >=100,000 COLONIES/mL PROTEUS MIRABILIS  Blood Culture ID Panel (Reflexed)     Status: Abnormal   Collection Time: 11-30-19 10:21 AM  Result Value Ref Range Status   Enterococcus faecalis NOT DETECTED NOT DETECTED Final   Enterococcus Faecium NOT  DETECTED NOT DETECTED Final   Listeria monocytogenes NOT DETECTED NOT DETECTED Final   Staphylococcus species NOT DETECTED NOT DETECTED Final   Staphylococcus aureus (BCID) NOT DETECTED NOT DETECTED Final   Staphylococcus epidermidis NOT DETECTED NOT DETECTED Final   Staphylococcus lugdunensis NOT DETECTED NOT DETECTED Final   Streptococcus species NOT DETECTED NOT DETECTED Final   Streptococcus agalactiae NOT DETECTED NOT DETECTED Final   Streptococcus pneumoniae NOT DETECTED NOT DETECTED Final   Streptococcus pyogenes NOT DETECTED NOT DETECTED Final   A.calcoaceticus-baumannii NOT DETECTED NOT DETECTED Final   Bacteroides fragilis NOT DETECTED NOT DETECTED Final   Enterobacterales DETECTED (A) NOT DETECTED Final    Comment: Enterobacterales represent a large order of gram negative bacteria, not a single organism. CRITICAL RESULT CALLED TO, READ BACK BY AND VERIFIED WITH: DAVID BESANTI 11/26/19 AT 0255 HS    Enterobacter cloacae complex NOT DETECTED NOT DETECTED Final   Escherichia coli NOT DETECTED NOT DETECTED Final   Klebsiella aerogenes NOT DETECTED NOT DETECTED Final   Klebsiella oxytoca NOT DETECTED NOT DETECTED Final   Klebsiella pneumoniae DETECTED (A) NOT DETECTED Final    Comment: CRITICAL RESULT CALLED TO, READ BACK BY AND VERIFIED WITH: DAVID BESANTI 11/26/19 AT 0255 HS    Proteus species NOT DETECTED NOT  DETECTED Final   Salmonella species NOT DETECTED NOT DETECTED Final   Serratia marcescens NOT DETECTED NOT DETECTED Final   Haemophilus influenzae NOT DETECTED NOT DETECTED Final   Neisseria meningitidis NOT DETECTED NOT DETECTED Final   Pseudomonas aeruginosa NOT DETECTED NOT DETECTED Final   Stenotrophomonas maltophilia NOT DETECTED NOT DETECTED Final   Candida albicans NOT DETECTED NOT DETECTED Final   Candida auris NOT DETECTED NOT DETECTED Final   Candida glabrata NOT DETECTED NOT DETECTED Final   Candida krusei NOT DETECTED NOT DETECTED Final   Candida  parapsilosis NOT DETECTED NOT DETECTED Final   Candida tropicalis NOT DETECTED NOT DETECTED Final   Cryptococcus neoformans/gattii NOT DETECTED NOT DETECTED Final   CTX-M ESBL NOT DETECTED NOT DETECTED Final   Carbapenem resistance IMP NOT DETECTED NOT DETECTED Final   Carbapenem resistance KPC NOT DETECTED NOT DETECTED Final   Carbapenem resistance NDM NOT DETECTED NOT DETECTED Final   Carbapenem resist OXA 48 LIKE NOT DETECTED NOT DETECTED Final   Carbapenem resistance VIM NOT DETECTED NOT DETECTED Final    Comment: Performed at [facility], [address] [place], [place] [address]  SARS Coronavirus 2 by RT PCR (hospital order, performed in [facility] hospital lab) Nasopharyngeal Nasopharyngeal Swab     Status: None   Collection Time: Dec 24, 2019 12:39 PM   Specimen: Nasopharyngeal Swab  Result Value Ref Range Status   SARS Coronavirus 2 NEGATIVE NEGATIVE Final    Comment: (NOTE) SARS-CoV-2 target nucleic acids are NOT DETECTED.  The SARS-CoV-2 RNA is generally detectable in upper and lower respiratory specimens during the acute phase of infection. The lowest concentration of SARS-CoV-2 viral copies this assay can detect is 250 copies / mL. A negative result does not preclude SARS-CoV-2 infection and should not be used as the sole basis for treatment or other patient management decisions.  A negative result may occur with improper specimen collection / handling, submission of specimen other than nasopharyngeal swab, presence of viral mutation(s) within the areas targeted by this assay, and inadequate number of viral copies (<250 copies / mL). A negative result must be combined with clinical observations, patient history, and epidemiological information.  Fact Sheet for Patients:   BoilerBrush.com.cy  Fact Sheet for Healthcare Providers: https://pope.com/  This test is not yet approved or  cleared by the [place] FDA  and has been authorized for detection and/or diagnosis of SARS-CoV-2 by FDA under an Emergency Use Authorization (EUA).  This EUA will remain in effect (meaning this test can be used) for the duration of the COVID-19 declaration under Section 564(b)(1) of the Act, 21 U.S.C. section 360bbb-3(b)(1), unless the authorization is terminated or revoked sooner.  Performed at [facility], [address] [place], [place] [address]   C Difficile Quick Screen w PCR reflex     Status: None   Collection Time: 11/27/19  6:18 AM   Specimen: STOOL  Result Value Ref Range Status   C Diff antigen NEGATIVE NEGATIVE Final   C Diff toxin NEGATIVE NEGATIVE Final   C Diff interpretation No C. difficile detected.  Final    Comment: Performed at [facility], [address] [place], [place] [address]  MRSA PCR Screening     Status: None   Collection Time: 11/27/19 11:30 PM   Specimen: Nasal Mucosa; Nasopharyngeal  Result Value Ref Range Status   MRSA by PCR NEGATIVE NEGATIVE Final    Comment:        The GeneXpert MRSA Assay (  FDA approved for NASAL specimens only), is one component of a comprehensive MRSA colonization surveillance program. It is not intended to diagnose MRSA infection nor to guide or monitor treatment for MRSA infections. Performed at [facility], [address] [place], [place] [address]   CULTURE, BLOOD (ROUTINE X 2) w Reflex to ID Panel     Status: None (Preliminary result)   Collection Time: 11/28/19 11:27 AM   Specimen: BLOOD  Result Value Ref Range Status   Specimen Description BLOOD LEFT ARM  Final   Special Requests   Final    BOTTLES DRAWN AEROBIC AND ANAEROBIC Blood Culture adequate volume   Culture   Final    NO GROWTH < 24 HOURS Performed at [facility], [address], [place], [place] [address]    Report Status PENDING  Incomplete  CULTURE, BLOOD (ROUTINE X 2) w Reflex to ID Panel     Status: None (Preliminary result)    Collection Time: 11/28/19 11:49 AM   Specimen: BLOOD  Result Value Ref Range Status   Specimen Description BLOOD RIGHT ARM  Final   Special Requests   Final    BOTTLES DRAWN AEROBIC AND ANAEROBIC Blood Culture adequate volume   Culture   Final    NO GROWTH < 24 HOURS Performed at [facility], [address] [place], [place] [address]    Report Status PENDING  Incomplete     Studies: [place] EKG SITE RITE  Result Date: 11/27/2019 If Site Rite image not attached, placement could not be confirmed due to current cardiac rhythm.   Scheduled Meds: . ARIPiprazole  2 mg Oral Daily  . aspirin EC  81 mg Oral Daily  . baclofen  5 mg Oral TID  . Chlorhexidine Gluconate Cloth  6 each Topical Daily  . enoxaparin (LOVENOX) injection  40 mg Subcutaneous Q24H  . levothyroxine  150 mcg Oral QAC breakfast  . midodrine  5 mg Oral TID WC  . multivitamin-lutein  1 capsule Oral Daily  . phosphorus  250 mg Oral BID  . pregabalin  100 mg Oral TID  . sodium hypochlorite  1 application Irrigation BID  . traZODone  50 mg Oral QHS  . venlafaxine XR  225 mg Oral Daily  . vitamin B-12  1,000 mcg Oral Daily  . zinc oxide   Topical BID   Continuous Infusions: . cefTRIAXone (ROCEPHIN)  IV 2 g (11/29/19 0340)  . famotidine (PEPCID) IV 20 mg (11/28/19 1703)    Assessment/Plan:  1. Septic shock secondary to Klebsiella growing in blood cultures.  Patient on Rocephin.  Findings and urine culture likely contamination.  Foley catheter changed yesterday. 2. Acute metabolic encephalopathy secondary to sepsis this is improved. 3. 2 stage IV sacral decubitus.  Present on admission.  We will get surgical consultation because I do see some necrotic material there. 4. Hypotension and tachycardia.  Patient on midodrine will add low-dose Toprol-XL since blood pressure is better. 5. Dysphagia.  Patient downgraded to dysphagia 1 diet with nectar thick liquids. 6. Severe protein calorie malnutrition 7. We will  get palliative care consultation. 8. Hypothyroidism unspecified on levothyroxine  Pressure Injury 09/29/19 Leg Left;Posterior;Lower Stage 2 -  Partial thickness loss of dermis presenting as a shallow open injury with a red, pink wound bed without slough. three small, round stage two pressure ulcers. (Active)  09/29/19 1445  Location: Leg  Location Orientation: Left;Posterior;Lower  Staging: Stage 2 -  Partial thickness loss of dermis presenting as  a shallow open injury with a red, pink wound bed without slough.  Wound Description (Comments): three small, round stage two pressure ulcers.  Present on Admission: Yes     Pressure Injury 11/27/19 Sacrum Stage 4 - Full thickness tissue loss with exposed bone, tendon or muscle. (Active)  11/27/19 2100  Location: Sacrum  Location Orientation:   Staging: Stage 4 - Full thickness tissue loss with exposed bone, tendon or muscle.  Wound Description (Comments):   Present on Admission: Yes     Pressure Injury 11/28/19 Buttocks Upper Stage 4 - Full thickness tissue loss with exposed bone, tendon or muscle. (Active)  11/28/19   Location: Buttocks  Location Orientation: Upper  Staging: Stage 4 - Full thickness tissue loss with exposed bone, tendon or muscle.  Wound Description (Comments):   Present on Admission: Yes       Code Status:     Code Status Orders  (From admission, onward)         Start     Ordered   25-Apr-2019 1459  Full code  Continuous        25-Apr-2019 1459        Code Status History    Date Active Date Inactive Code Status Order ID Comments User Context   09/29/2019 1309 10/05/2019 2114 Full Code [id number]  Salena SanerGonzalez, Carmen L, MD ED   01/15/2017 2127 02/11/2017 1733 Full Code [id number]  Houston SirenSainani, Vivek J, MD ED   01/01/2017 2006 01/03/2017 1805 Full Code [id number]  Shaune Pollackhen, Qing, MD ED   12/19/2016 0135 12/20/2016 1557 Full Code [id number]  Oralia ManisWillis, David, MD Inpatient   09/12/2016 0513 09/13/2016 1812 Full Code [id number]  Arnaldo Nataliamond, Michael S,  MD Inpatient   08/14/2016 1930 08/15/2016 1942 Full Code [id number]  Alford HighlandWieting, Sedale Jenifer, MD ED   07/06/2016 0749 07/07/2016 2105 Full Code [id number]  Arnaldo Nataliamond, Michael S, MD Inpatient   Advance Care Planning Activity    Advance Directive Documentation     Most Recent Value  Type of Advance Directive Healthcare Power of Attorney  Pre-existing out of facility DNR order (yellow form or pink MOST form) --  "MOST" Form in Place? --     Family Communication: Spoke with husband at the bedside Disposition Plan: Status is: Inpatient  Dispo:  Patient From: Home  Planned Disposition: Home with home health  Expected discharge date: We will discuss case with sound tomorrow.  Potential disposition depending on our discussions tomorrow.  Medically being treated for Klebsiella bacteremia with IV antibiotics.  Consultants:  Infectious disease  General surgery  Palliative care  Antibiotics:  Rocephin  Time spent: 28 minutes  Odell Fasching [employer]  [facility]

## 2019-11-29 NOTE — Progress Notes (Signed)
RN from day shift reports that patient was still on Q4 vitals from [facility] that was started on night shift on 11/27/2019. RN reports that during shift day shift on 11/28/19 MEWS score kept changing from yellow to green to yellow while protocol from RED MEWS was still in place.  RN reports charge RN was notified.

## 2019-11-29 NOTE — Progress Notes (Signed)
  Speech Language Pathology Treatment: Dysphagia  Patient Details Name: Jessica [name] MRN: [id number] DOB: [birth year] Today's Date: 11/29/2019 Time: 3790-2409; [id number] SLP Time Calculation (min) (ACUTE ONLY): 20 min; 15 min  Assessment / Plan / Recommendation Clinical Impression  Pt is known to this therapist. At baseline, pt has brief oral hold prior to swallow. During this evaluation, pt consumed thin liquids via straw without any overt s/s of aspiration. Straws pose less of a risk as pt is able to govern how big of a sip she takes vs. Someone holding cup to her lips. Pt consumed her medicine crushed in puree with good oral clearing and appropriate oral phase time. Silent aspiration cannot be ruled out at bedside and an instrumental study is not possible d/t spinal stenosis and severe sacral wounds. At this time, would recommend upgrading to thin liquids via controlled straw sips. Continue puree diet for energy conservation and ease of chewing d/t pt's fluctuating lethargy.   [phone] As SLP was leaving pt's room, nursing listened to pt's lungs which now sound worse after consuming thin liquids. Prior to consuming thin liquids, pt's lungs sounded clear. Given this change in respiratory sound, inability to perform an instrumental study to rule out silent aspiration and pt's continues to have persistent right lower lobe infiltrate and effusion, clinical decision made to continue nectar thick liquids via spoon.   At this point in pt's care with such a HIGH RISK OF ASPIRATION, pt to remain on this diet while hospitalized. With current presentation, silent aspiration cannot be ruled out, so even if pt improves we will be unable to upgrade liquids without an instrumental study. IF PT SHOULD DESIRE THIN LIQUIDS, IT SHOULD BE UNDER COMFORT MEASURES AND TRANSITION TO PALLIATIVE CARE.   Extensive education provided to pt's husband who voiced understanding of recommendations. ST intervention is not  indicated d/t gravity of pt's situation.    HPI HPI: Per admitting H and P "has a past medical history of Chronic pain, Depression, Diastolic heart failure ([place]), HLD (hyperlipidemia), HTN (hypertension), Hypothyroidism, Morbid obesity ([place]), Recurrent UTI, Spinal stenosis, and Visual hallucinations.      SLP Plan  All goals met       Recommendations  Diet recommendations: Dysphagia 1 (puree);Nectar-thick liquid Liquids provided via: Teaspoon Medication Administration: Crushed with puree Supervision: Staff to assist with self feeding;Trained caregiver to feed patient Compensations: Slow rate;Small sips/bites;Minimize environmental distractions Postural Changes and/or Swallow Maneuvers: Seated upright 90 degrees;Upright 30-60 min after meal                Oral Care Recommendations: Oral care BID Follow up Recommendations:  (Palliative Care) SLP Visit Diagnosis: Dysphagia, oropharyngeal phase (R13.12) Plan: All goals met       GO               Jessica [name] B. Rutherford Nail M.S., CCC-SLP, [place] Office [phone]  Jessica [name] 11/29/2019, 7:21 PM

## 2019-11-29 NOTE — Consult Note (Signed)
Subjective:   CC: Sacral wound  HPI:  Jessica [name] is a 74 y.o. female who was consulted by [facility] for evaluation of above.  History obtained from husband and son who was at bedside secondary to patient mental status.  Wound has been a chronic issue that has been monitored by the wound care center.  It has been improving but had a recent hospitalization that led to worsening of the wound.  Concern is for some necrotic tissue by the hospitalist who requested evaluation for possible surgical debridement.   Past Medical History:  has a past medical history of Chronic pain, Depression, Diastolic heart failure (HCC), HLD (hyperlipidemia), HTN (hypertension), Hypothyroidism, Morbid obesity (HCC), Recurrent UTI, Spinal stenosis, and Visual hallucinations.  Past Surgical History:  has a past surgical history that includes Back surgery; Abdominal hysterectomy; Hemorroidectomy; and IR Radiologist Eval & Mgmt (09/01/2017).  Family History: family history includes Deep vein thrombosis in her father; Heart disease in her father.  Social History:  reports that she has never smoked. She has quit using smokeless tobacco.  Her smokeless tobacco use included snuff. She reports that she does not drink alcohol and does not use drugs.  Current Medications:  Prior to Admission medications   Medication Sig Start Date End Date Taking? Authorizing Provider  acetaminophen (TYLENOL) 325 MG tablet Take 650 mg by mouth every 6 (six) hours as needed for mild pain.   Yes [provider]  albuterol (PROVENTIL HFA;VENTOLIN HFA) 108 (90 Base) MCG/ACT inhaler Inhale 2 puffs into the lungs every 4 (four) hours as needed for wheezing or shortness of breath.   Yes [provider]  ARIPiprazole (ABILIFY) 2 MG tablet Take 2 mg by mouth daily.  03/14/17  Yes [provider]  aspirin EC 81 MG tablet Take 81 mg by mouth daily.   Yes [provider]  baclofen 5 MG TABS Take 5 mg by mouth 3 (three)  times daily. 10/05/19  Yes Alford Highland, MD  furosemide (LASIX) 20 MG tablet Two tablets daily for one week then can take one tablet daily after that 10/05/19  Yes Wieting, Richard, MD  levothyroxine (SYNTHROID, LEVOTHROID) 150 MCG tablet Take 150 mcg by mouth daily before breakfast.   Yes [provider]  midodrine (PROAMATINE) 10 MG tablet Take 1 tablet (10 mg total) by mouth 3 (three) times daily with meals. 10/05/19  Yes Wieting, Richard, MD  ondansetron (ZOFRAN-ODT) 4 MG disintegrating tablet Take 4 mg by mouth every 8 (eight) hours as needed for nausea or vomiting.   Yes [provider]  potassium chloride (KLOR-CON) 10 MEQ tablet Take 10 mEq by mouth daily.   Yes [provider]  pregabalin (LYRICA) 100 MG capsule Take 100 mg by mouth 3 (three) times daily.    Yes [provider]  traZODone (DESYREL) 50 MG tablet Take 50 mg by mouth at bedtime.   Yes [provider]  Venlafaxine HCl 225 MG TB24 Take 225 mg by mouth daily.   Yes [provider]  vitamin B-12 (CYANOCOBALAMIN) 1000 MCG tablet Take 1,000 mcg by mouth daily.   Yes [provider]    Allergies:  Allergies  Allergen Reactions  . Ambien [Zolpidem Tartrate] Other (See Comments)    Reaction: altered mental status  . Zinc Other (See Comments)    Zinc ointments cause skin irritation    ROS:  Unable to obtain secondary to patient mental status    Objective:     BP (!) 119/52 (  BP Location: Left Arm)   Pulse 99   Temp 98.7 F (37.1 C) (Oral)   Resp 15   Ht 5\' 10"  (1.778 m)   Wt 99.3 kg   SpO2 95%   BMI 31.41 kg/m   Constitutional :  appears stated age and no distress  Lymphatics/Throat:  no asymmetry, masses, or scars  Respiratory:  clear to auscultation bilaterally  Cardiovascular:  regular rate and rhythm  Gastrointestinal: soft, non-tender; bowel sounds normal; no masses,  no organomegaly.  Musculoskeletal: Steady gait and movement  Skin: Cool and  moist.  Sacral decubitus wound as noted below.  Area of fibrinous exudate but overall wound edges look clean and healthy, active bleeding with simple dressing changes secondary to the aspirin use.  Bruising noted on the right lateral wounds medial aspect, but again healthy bleeding edges noted.  Psychiatric:  non-agitated            LABS:  CMP Latest Ref Rng & Units 11/29/2019 11/28/2019 11/27/2019  Glucose 70 - 99 mg/dL 93 11/29/2019) 124(P)  BUN 8 - 23 mg/dL 80(D) <9(I) 7(L)  Creatinine 0.44 - 1.00 mg/dL <3(J) 8.25(K 5.39  Sodium 135 - 145 mmol/L 140 139 138  Potassium 3.5 - 5.1 mmol/L 4.0 3.7 3.0(L)  Chloride 98 - 111 mmol/L 109 106 101  CO2 22 - 32 mmol/L 27 25 24   Calcium 8.9 - 10.3 mg/dL 7.9(L) 8.0(L) 7.9(L)  Total Protein 6.5 - 8.1 g/dL 4.7(L) 5.1(L) 5.2(L)  Total Bilirubin 0.3 - 1.2 mg/dL 2.1(H) 2.1(H) 2.3(H)  Alkaline Phos 38 - 126 U/L 126 128(H) 126  AST 15 - 41 U/L 104(H) 84(H) 61(H)  ALT 0 - 44 U/L 41 35 30   CBC Latest Ref Rng & Units 11/29/2019 11/28/2019 11/27/2019  WBC 4.0 - 10.5 K/uL 11.3(H) 9.9 12.9(H)  Hemoglobin 12.0 - 15.0 g/dL 9.0(L) 8.6(L) 8.6(L)  Hematocrit 36 - 46 % 27.5(L) 24.6(L) 25.0(L)  Platelets 150 - 400 K/uL 180 169 188    RADS: n/a  Assessment:      Sacral decubitus ulcer  Plan:     Areas of bruising noted along some of the wound edges, but easy bleeding was noted with simple dressing changes.  Bleeding did eventually stop after gentle pressure for a few minutes.  Wound was redressed with Dakin's solution packing and secured with ABD and paper tape by myself after evaluation.  At this point, areas of concern likely more bruising than actual necrotic tissue, therefore no surgical debridement in the operating room I believe is necessary at this time.  She is still at a very high risk of developing issues with this wound including new portions of necrotic tissue or excessive fibrinous exudate.  Recommend continue close monitoring by the wound care center  and referral back to 11/30/2019 as needed for possible surgical debridement.  No need for routine follow-up in my office for this at this time.

## 2019-11-30 DIAGNOSIS — Z515 Encounter for palliative care: Secondary | ICD-10-CM

## 2019-11-30 DIAGNOSIS — R627 Adult failure to thrive: Secondary | ICD-10-CM

## 2019-11-30 DIAGNOSIS — Z7189 Other specified counseling: Secondary | ICD-10-CM

## 2019-11-30 LAB — CBC WITH DIFFERENTIAL/PLATELET
Abs Immature Granulocytes: 0.05 10*3/uL (ref 0.00–0.07)
Basophils Absolute: 0.1 10*3/uL (ref 0.0–0.1)
Basophils Relative: 1 %
Eosinophils Absolute: 0.2 10*3/uL (ref 0.0–0.5)
Eosinophils Relative: 1 %
HCT: 29.3 % — ABNORMAL LOW (ref 36.0–46.0)
Hemoglobin: 9.5 g/dL — ABNORMAL LOW (ref 12.0–15.0)
Immature Granulocytes: 0 %
Lymphocytes Relative: 25 %
Lymphs Abs: 3 10*3/uL (ref 0.7–4.0)
MCH: 30.5 pg (ref 26.0–34.0)
MCHC: 32.4 g/dL (ref 30.0–36.0)
MCV: 94.2 fL (ref 80.0–100.0)
Monocytes Absolute: 1.4 10*3/uL — ABNORMAL HIGH (ref 0.1–1.0)
Monocytes Relative: 11 %
Neutro Abs: 7.4 10*3/uL (ref 1.7–7.7)
Neutrophils Relative %: 62 %
Platelets: 200 10*3/uL (ref 150–400)
RBC: 3.11 MIL/uL — ABNORMAL LOW (ref 3.87–5.11)
RDW: 16.5 % — ABNORMAL HIGH (ref 11.5–15.5)
WBC: 12.2 10*3/uL — ABNORMAL HIGH (ref 4.0–10.5)
nRBC: 0 % (ref 0.0–0.2)

## 2019-11-30 LAB — GLUCOSE, CAPILLARY
Glucose-Capillary: 87 mg/dL (ref 70–99)
Glucose-Capillary: 94 mg/dL (ref 70–99)
Glucose-Capillary: 82 mg/dL (ref 70–99)
Glucose-Capillary: 77 mg/dL (ref 70–99)

## 2019-11-30 LAB — COMPREHENSIVE METABOLIC PANEL
ALT: 41 U/L (ref 0–44)
AST: 86 U/L — ABNORMAL HIGH (ref 15–41)
Albumin: 2.2 g/dL — ABNORMAL LOW (ref 3.5–5.0)
Alkaline Phosphatase: 128 U/L — ABNORMAL HIGH (ref 38–126)
Anion gap: 6 (ref 5–15)
BUN: <5 mg/dL — ABNORMAL LOW (ref 8–23)
CO2: 26 mmol/L (ref 22–32)
Calcium: 8 mg/dL — ABNORMAL LOW (ref 8.9–10.3)
Chloride: 111 mmol/L (ref 98–111)
Creatinine, Ser: 0.49 mg/dL (ref 0.44–1.00)
GFR calc Af Amer: >60 mL/min (ref >60)
GFR calc non Af Amer: >60 mL/min (ref >60)
Glucose, Bld: 99 mg/dL (ref 70–99)
Potassium: 4.2 mmol/L (ref 3.5–5.1)
Sodium: 143 mmol/L (ref 135–145)
Total Bilirubin: 2.7 mg/dL — ABNORMAL HIGH (ref 0.3–1.2)
Total Protein: 5 g/dL — ABNORMAL LOW (ref 6.5–8.1)

## 2019-11-30 MED ORDER — FAMOTIDINE 20 MG PO TABS
20.0000 mg | ORAL_TABLET | Freq: Every day | ORAL | Status: DC
  Administered 2019-11-30 – 2019-12-04 (×5): 20 mg via ORAL
  Filled 2019-11-30 (×6): qty 1

## 2019-11-30 NOTE — Progress Notes (Signed)
Patient ID: Jessica [name], female   DOB: [birth year], 74 y.o.   MRN: [id number] [facility] PROGRESS NOTE  Jessica [name]:[id number] DOB: Jul 09, 1945 DOA: 12-25-2019 PCP: Annita Brod, MD  HPI/Subjective: Patient again wanted to go home today.  States she feels okay.  She was eating breakfast this morning with her husband.  Objective: Vitals:   11/30/19 0412 11/30/19 0738  BP: (!) 109/48 (!) 122/59  Pulse: 76 92  Resp: 15 18  Temp: 98.6 F (37 C) 98.6 F (37 C)  SpO2: 94% 98%    Intake/Output Summary (Last 24 hours) at 11/30/2019 1611 Last data filed at 11/30/2019 1500 Gross per 24 hour  Intake 200 ml  Output 250 ml  Net -50 ml   Filed Weights   12/25/2019 1207 11/28/19 0600  Weight: 97.3 kg 99.3 kg    ROS: Review of Systems  Respiratory: Negative for shortness of breath.   Cardiovascular: Negative for chest pain.  Gastrointestinal: Negative for abdominal pain, nausea and vomiting.   Exam: Physical Exam HENT:     Nose: No mucosal edema.     Mouth/Throat:     Pharynx: No oropharyngeal exudate.  Eyes:     General: Lids are normal.     Conjunctiva/sclera: Conjunctivae normal.     Pupils: Pupils are equal, round, and reactive to light.  Cardiovascular:     Rate and Rhythm: Normal rate and regular rhythm.     Heart sounds: Normal heart sounds, S1 normal and S2 normal.  Pulmonary:     Breath sounds: No decreased breath sounds, wheezing, rhonchi or rales.  Abdominal:     Palpations: Abdomen is soft.     Tenderness: There is no abdominal tenderness.  Skin:    General: Skin is warm.     Comments: Two stage IV decubiti on buttock.  Neurological:     Mental Status: She is alert.     Comments: Answers yes or no questions       Data Reviewed: Basic Metabolic Panel: Recent Labs  Lab 11/26/19 0846 11/26/19 0846 11/26/19 1837 11/27/19 0550 11/28/19 0412 11/29/19 0418 11/29/19 0811 11/30/19 0344  NA 143  --   --  138 139 140  --  143  K 2.8*   --   --  3.0* 3.7 4.0  --  4.2  CL 103  --   --  101 106 109  --  111  CO2 28  --   --  24 25 27   --  26  GLUCOSE 84  --   --  68* 116* 93  --  99  BUN 8  --   --  7* <5* <5*  --  <5*  CREATININE 0.69   < > 0.44 0.59 0.52 0.35*  --  0.49  CALCIUM 8.3*  --   --  7.9* 8.0* 7.9*  --  8.0*  MG 1.7  --   --  1.7 2.0  --  1.9  --   PHOS 3.1  --   --  2.7 2.2*  --  2.4*  --    < > = values in this interval not displayed.   Liver Function Tests: Recent Labs  Lab 11/26/19 0846 11/27/19 0550 11/28/19 0412 11/29/19 0418 11/30/19 0344  AST 76* 61* 84* 104* 86*  ALT 33 30 35 41 41  ALKPHOS 141* 126 128* 126 128*  BILITOT 2.6* 2.3* 2.1* 2.1* 2.7*  PROT 5.5* 5.2* 5.1* 4.7* 5.0*  ALBUMIN 2.8* 2.5* 2.6*  2.2* 2.2*   No results for input(s): LIPASE, AMYLASE in the last 168 hours. No results for input(s): AMMONIA in the last 168 hours. CBC: Recent Labs  Lab 11/26/19 0846 11/26/19 0846 11/26/19 1837 11/27/19 0550 11/28/19 0412 11/29/19 0418 11/30/19 0344  WBC 16.2*   < > 15.4* 12.9* 9.9 11.3* 12.2*  NEUTROABS 13.6*  --   --  9.7* 7.0 7.0 7.4  HGB 9.0*   < > 8.5* 8.6* 8.6* 9.0* 9.5*  HCT 26.5*   < > 26.3* 25.0* 24.6* 27.5* 29.3*  MCV 91.1   < > 93.6 90.9 90.1 92.9 94.2  PLT 194   < > 199 188 169 180 200   < > = values in this interval not displayed.    CBG: Recent Labs  Lab 11/29/19 1154 11/29/19 1647 11/29/19 2149 11/30/19 0758 11/30/19 1119  GLUCAP 116* 119* 112* 87 94    Recent Results (from the past 240 hour(s))  Culture, blood (Routine x 2)     Status: Abnormal   Collection Time: 12/11/2019 10:21 AM   Specimen: BLOOD  Result Value Ref Range Status   Specimen Description   Final    BLOOD RIGHT ANTECUBITAL Performed at [facility], [address] [place], [place] [address]    Special Requests   Final    BOTTLES DRAWN AEROBIC AND ANAEROBIC Blood Culture results may not be optimal due to an excessive volume of blood received in culture bottles Performed at  [facility], [address] [place], [place] [address]    Culture  Setup Time   Final    Organism ID to follow GRAM NEGATIVE RODS IN BOTH AEROBIC AND ANAEROBIC BOTTLES CRITICAL RESULT CALLED TO, READ BACK BY AND VERIFIED WITH: DAVID BESANTI 11/26/19 AT 0255 HS Performed at [facility], [address] [place], [place] [address]    Culture KLEBSIELLA PNEUMONIAE (A)  Final   Report Status 11/28/2019 FINAL  Final   Organism ID, Bacteria KLEBSIELLA PNEUMONIAE  Final      Susceptibility   Klebsiella pneumoniae - MIC*    AMPICILLIN RESISTANT Resistant     CEFAZOLIN <=4 SENSITIVE Sensitive     CEFEPIME <=0.12 SENSITIVE Sensitive     CEFTAZIDIME <=1 SENSITIVE Sensitive     CEFTRIAXONE <=0.25 SENSITIVE Sensitive     CIPROFLOXACIN <=0.25 SENSITIVE Sensitive     GENTAMICIN <=1 SENSITIVE Sensitive     IMIPENEM <=0.25 SENSITIVE Sensitive     TRIMETH/SULFA <=20 SENSITIVE Sensitive     AMPICILLIN/SULBACTAM <=2 SENSITIVE Sensitive     PIP/TAZO <=4 SENSITIVE Sensitive     * KLEBSIELLA PNEUMONIAE  Culture, blood (Routine x 2)     Status: Abnormal   Collection Time: 11/23/2019 10:21 AM   Specimen: BLOOD  Result Value Ref Range Status   Specimen Description   Final    BLOOD RH Performed at [facility], [address] [place], [place] [address]    Special Requests   Final    BOTTLES DRAWN AEROBIC AND ANAEROBIC Blood Culture results may not be optimal due to an excessive volume of blood received in culture bottles Performed at [facility], [address] [place], [place] [address]    Culture  Setup Time   Final    GRAM NEGATIVE RODS AEROBIC BOTTLE ONLY CRITICAL VALUE NOTED.  VALUE IS CONSISTENT WITH PREVIOUSLY REPORTED AND CALLED VALUE. Performed at [facility], [address] [place], [place] [address]    Culture (A)  Final  KLEBSIELLA PNEUMONIAE SUSCEPTIBILITIES PERFORMED ON PREVIOUS CULTURE WITHIN THE LAST 5 DAYS. Performed at  [facility], [address] [place], [place] [address]    Report Status 11/28/2019 FINAL  Final  Urine culture     Status: Abnormal   Collection Time: 11/30/2019 10:21 AM   Specimen: In/Out Cath Urine  Result Value Ref Range Status   Specimen Description   Final    IN/OUT CATH URINE Performed at [facility], [address] [place], [place] [address]    Special Requests   Final    NONE Performed at [facility], [address] [place], [place] [address]    Culture (A)  Final    >=100,000 COLONIES/mL PROTEUS MIRABILIS 30,000 COLONIES/mL CITROBACTER FREUNDII    Report Status 11/29/2019 FINAL  Final   Organism ID, Bacteria PROTEUS MIRABILIS (A)  Final   Organism ID, Bacteria CITROBACTER FREUNDII (A)  Final      Susceptibility   Citrobacter freundii - MIC*    CEFAZOLIN >=64 RESISTANT Resistant     CEFTRIAXONE <=0.25 SENSITIVE Sensitive     CIPROFLOXACIN <=0.25 SENSITIVE Sensitive     GENTAMICIN <=1 SENSITIVE Sensitive     IMIPENEM 0.5 SENSITIVE Sensitive     NITROFURANTOIN <=16 SENSITIVE Sensitive     TRIMETH/SULFA <=20 SENSITIVE Sensitive     PIP/TAZO <=4 SENSITIVE Sensitive     * 30,000 COLONIES/mL CITROBACTER FREUNDII   Proteus mirabilis - MIC*    AMPICILLIN >=32 RESISTANT Resistant     CEFAZOLIN >=64 RESISTANT Resistant     CEFTRIAXONE 32 RESISTANT Resistant     CIPROFLOXACIN >=4 RESISTANT Resistant     GENTAMICIN >=16 RESISTANT Resistant     IMIPENEM 2 SENSITIVE Sensitive     NITROFURANTOIN 128 RESISTANT Resistant     TRIMETH/SULFA >=320 RESISTANT Resistant     AMPICILLIN/SULBACTAM >=32 RESISTANT Resistant     PIP/TAZO <=4 SENSITIVE Sensitive     * >=100,000 COLONIES/mL PROTEUS MIRABILIS  Blood Culture ID Panel (Reflexed)     Status: Abnormal   Collection Time: 11/23/2019 10:21 AM  Result Value Ref Range Status   Enterococcus faecalis NOT DETECTED NOT DETECTED Final   Enterococcus Faecium NOT DETECTED NOT DETECTED Final   Listeria  monocytogenes NOT DETECTED NOT DETECTED Final   Staphylococcus species NOT DETECTED NOT DETECTED Final   Staphylococcus aureus (BCID) NOT DETECTED NOT DETECTED Final   Staphylococcus epidermidis NOT DETECTED NOT DETECTED Final   Staphylococcus lugdunensis NOT DETECTED NOT DETECTED Final   Streptococcus species NOT DETECTED NOT DETECTED Final   Streptococcus agalactiae NOT DETECTED NOT DETECTED Final   Streptococcus pneumoniae NOT DETECTED NOT DETECTED Final   Streptococcus pyogenes NOT DETECTED NOT DETECTED Final   A.calcoaceticus-baumannii NOT DETECTED NOT DETECTED Final   Bacteroides fragilis NOT DETECTED NOT DETECTED Final   Enterobacterales DETECTED (A) NOT DETECTED Final    Comment: Enterobacterales represent a large order of gram negative bacteria, not a single organism. CRITICAL RESULT CALLED TO, READ BACK BY AND VERIFIED WITH: DAVID BESANTI 11/26/19 AT 0255 HS    Enterobacter cloacae complex NOT DETECTED NOT DETECTED Final   Escherichia coli NOT DETECTED NOT DETECTED Final   Klebsiella aerogenes NOT DETECTED NOT DETECTED Final   Klebsiella oxytoca NOT DETECTED NOT DETECTED Final   Klebsiella pneumoniae DETECTED (A) NOT DETECTED Final    Comment: CRITICAL RESULT CALLED TO, READ BACK BY AND VERIFIED WITH: DAVID BESANTI 11/26/19 AT 0255 HS    Proteus species NOT DETECTED NOT DETECTED Final  Salmonella species NOT DETECTED NOT DETECTED Final   Serratia marcescens NOT DETECTED NOT DETECTED Final   Haemophilus influenzae NOT DETECTED NOT DETECTED Final   Neisseria meningitidis NOT DETECTED NOT DETECTED Final   Pseudomonas aeruginosa NOT DETECTED NOT DETECTED Final   Stenotrophomonas maltophilia NOT DETECTED NOT DETECTED Final   Candida albicans NOT DETECTED NOT DETECTED Final   Candida auris NOT DETECTED NOT DETECTED Final   Candida glabrata NOT DETECTED NOT DETECTED Final   Candida krusei NOT DETECTED NOT DETECTED Final   Candida parapsilosis NOT DETECTED NOT DETECTED Final    Candida tropicalis NOT DETECTED NOT DETECTED Final   Cryptococcus neoformans/gattii NOT DETECTED NOT DETECTED Final   CTX-M ESBL NOT DETECTED NOT DETECTED Final   Carbapenem resistance IMP NOT DETECTED NOT DETECTED Final   Carbapenem resistance KPC NOT DETECTED NOT DETECTED Final   Carbapenem resistance NDM NOT DETECTED NOT DETECTED Final   Carbapenem resist OXA 48 LIKE NOT DETECTED NOT DETECTED Final   Carbapenem resistance VIM NOT DETECTED NOT DETECTED Final    Comment: Performed at [facility], [address] [place], [place] [address]  SARS Coronavirus 2 by RT PCR (hospital order, performed in [facility] hospital lab) Nasopharyngeal Nasopharyngeal Swab     Status: None   Collection Time: 12/07/2019 12:39 PM   Specimen: Nasopharyngeal Swab  Result Value Ref Range Status   SARS Coronavirus 2 NEGATIVE NEGATIVE Final    Comment: (NOTE) SARS-CoV-2 target nucleic acids are NOT DETECTED.  The SARS-CoV-2 RNA is generally detectable in upper and lower respiratory specimens during the acute phase of infection. The lowest concentration of SARS-CoV-2 viral copies this assay can detect is 250 copies / mL. A negative result does not preclude SARS-CoV-2 infection and should not be used as the sole basis for treatment or other patient management decisions.  A negative result may occur with improper specimen collection / handling, submission of specimen other than nasopharyngeal swab, presence of viral mutation(s) within the areas targeted by this assay, and inadequate number of viral copies (<250 copies / mL). A negative result must be combined with clinical observations, patient history, and epidemiological information.  Fact Sheet for Patients:   BoilerBrush.com.cy  Fact Sheet for Healthcare Providers: https://pope.com/  This test is not yet approved or  cleared by the [place] FDA and has been authorized for detection and/or  diagnosis of SARS-CoV-2 by FDA under an Emergency Use Authorization (EUA).  This EUA will remain in effect (meaning this test can be used) for the duration of the COVID-19 declaration under Section 564(b)(1) of the Act, 21 U.S.C. section 360bbb-3(b)(1), unless the authorization is terminated or revoked sooner.  Performed at [facility], [address] [place], [place] [address]   C Difficile Quick Screen w PCR reflex     Status: None   Collection Time: 11/27/19  6:18 AM   Specimen: STOOL  Result Value Ref Range Status   C Diff antigen NEGATIVE NEGATIVE Final   C Diff toxin NEGATIVE NEGATIVE Final   C Diff interpretation No C. difficile detected.  Final    Comment: Performed at [facility], [address] [place], [place] [address]  MRSA PCR Screening     Status: None   Collection Time: 11/27/19 11:30 PM   Specimen: Nasal Mucosa; Nasopharyngeal  Result Value Ref Range Status   MRSA by PCR NEGATIVE NEGATIVE Final    Comment:        The GeneXpert MRSA Assay (FDA approved for NASAL  specimens only), is one component of a comprehensive MRSA colonization surveillance program. It is not intended to diagnose MRSA infection nor to guide or monitor treatment for MRSA infections. Performed at [facility], [address] [place], [place] [address]   CULTURE, BLOOD (ROUTINE X 2) w Reflex to ID Panel     Status: None (Preliminary result)   Collection Time: 11/28/19 11:27 AM   Specimen: BLOOD  Result Value Ref Range Status   Specimen Description BLOOD LEFT ARM  Final   Special Requests   Final    BOTTLES DRAWN AEROBIC AND ANAEROBIC Blood Culture adequate volume   Culture   Final    NO GROWTH 2 DAYS Performed at [facility], [address], [place], [place] [address]    Report Status PENDING  Incomplete  CULTURE, BLOOD (ROUTINE X 2) w Reflex to ID Panel     Status: None (Preliminary result)   Collection Time: 11/28/19 11:49 AM   Specimen:  BLOOD  Result Value Ref Range Status   Specimen Description BLOOD RIGHT ARM  Final   Special Requests   Final    BOTTLES DRAWN AEROBIC AND ANAEROBIC Blood Culture adequate volume   Culture   Final    NO GROWTH 2 DAYS Performed at [facility], [address] [place], [place] [address]    Report Status PENDING  Incomplete     Scheduled Meds: . ARIPiprazole  2 mg Oral Daily  . aspirin EC  81 mg Oral Daily  . baclofen  5 mg Oral TID  . Chlorhexidine Gluconate Cloth  6 each Topical Daily  . enoxaparin (LOVENOX) injection  40 mg Subcutaneous Q24H  . famotidine  20 mg Oral Daily  . levothyroxine  150 mcg Oral QAC breakfast  . metoprolol succinate  25 mg Oral QPM  . midodrine  5 mg Oral TID WC  . multivitamin-lutein  1 capsule Oral Daily  . pregabalin  100 mg Oral TID  . sodium hypochlorite  1 application Irrigation BID  . traZODone  50 mg Oral QHS  . venlafaxine XR  225 mg Oral Daily  . vitamin B-12  1,000 mcg Oral Daily  . zinc oxide   Topical BID   Continuous Infusions: . cefTRIAXone (ROCEPHIN)  IV Stopped (11/30/19 0700)    Assessment/Plan:  1.  Septic shock secondary to Klebsiella and blood cultures.  Patient on Rocephin.  Urine culture likely contamination.  Foley catheter changed during this hospital stay.  Will switch over to oral antibiotics upon disposition home. 2.  Acute metabolic encephalopathy secondary to sepsis.  This has improved. 3.  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  Appreciate surgical evaluation.  Continue wet-to-dry dressings. 4.  Hypotension and tachycardia.  Patient on midodrine to lift up blood pressure and low-dose Toprol-XL to control heart rate. 5.  Dysphagia.  Patient on dysphagia 1 diet with nectar thick liquids but wants to drink water. 6.  Severe protein calorie malnutrition. 7.  Appreciate palliative care consultation.  Palliative to follow as outpatient. 8.  Hypothyroidism unspecified on levothyroxine  Pressure Injury 09/29/19  Leg Left;Posterior;Lower Stage 2 -  Partial thickness loss of dermis presenting as a shallow open injury with a red, pink wound bed without slough. three small, round stage two pressure ulcers. (Active)  09/29/19 1445  Location: Leg  Location Orientation: Left;Posterior;Lower  Staging: Stage 2 -  Partial thickness loss of dermis presenting as a shallow open injury with a red, pink wound bed without slough.  Wound Description (Comments): three small, round stage two pressure ulcers.  Present on Admission: Yes     Pressure Injury 11/27/19 Sacrum Stage 4 - Full thickness tissue loss with exposed bone, tendon or muscle. (Active)  11/27/19 2100  Location: Sacrum  Location Orientation:   Staging: Stage 4 - Full thickness tissue loss with exposed bone, tendon or muscle.  Wound Description (Comments):   Present on Admission: Yes     Pressure Injury 11/28/19 Buttocks Upper Stage 4 - Full thickness tissue loss with exposed bone, tendon or muscle. (Active)  11/28/19   Location: Buttocks  Location Orientation: Upper  Staging: Stage 4 - Full thickness tissue loss with exposed bone, tendon or muscle.  Wound Description (Comments):   Present on Admission: Yes       Code Status:     Code Status Orders  (From admission, onward)         Start     Ordered   11/29/2019 1459  Full code  Continuous        12/06/2019 1459        Code Status History    Date Active Date Inactive Code Status Order ID Comments User Context   09/29/2019 1309 10/05/2019 2114 Full Code [id number]  Salena Saner, MD ED   01/15/2017 2127 02/11/2017 1733 Full Code [id number]  Houston Siren, MD ED   01/01/2017 2006 01/03/2017 1805 Full Code [id number]  Shaune Pollack, MD ED   12/19/2016 0135 12/20/2016 1557 Full Code [id number]  Oralia Manis, MD Inpatient   09/12/2016 0513 09/13/2016 1812 Full Code [id number]  Arnaldo Natal, MD Inpatient   08/14/2016 1930 08/15/2016 1942 Full Code [id number]  Alford Highland, MD ED   07/06/2016 0749  07/07/2016 2105 Full Code [id number]  Arnaldo Natal, MD Inpatient   Advance Care Planning Activity    Advance Directive Documentation     Most Recent Value  Type of Advance Directive Healthcare Power of Attorney  Pre-existing out of facility DNR order (yellow form or pink MOST form) --  "MOST" Form in Place? --     Family Communication: Spoke with husband at the bedside, spoke with son on the phone Disposition Plan: Status is: Inpatient  Dispo:  Patient From: Home  Planned Disposition: Home with Health Care Svc  Expected discharge date: 12/01/19  Medically being treated for sepsis with IV antibiotics.  Will reassess tomorrow morning for potential discharge home.  Consultants:  Palliative care  General surgery  Antibiotics:  IV Rocephin  Time spent: 28 minutes  [employer]

## 2019-11-30 NOTE — Consult Note (Signed)
Consultation Note Date: 11/30/2019   [name]  DOB: 1945/06/24  MRN: [id number]  Age / Sex: 74 y.o., female  PCP: Annita Brod, MD Referring Physician: Alford Highland, MD  Reason for Consultation: Establishing goals of care and Psychosocial/spiritual support  HPI/Patient Profile: 74 y.o. female   admitted on 12-11-19 with  history of multiple UTIs, sacral decub, sacral osteomyelitis, spinal stenosis, hypothyroidism, hypertension, diastolic heart failure, depression, chronic pain and visual hallucinations.    Patient presented to ED because of because of altered mental status and decreased responsiveness. Patient lives at home with her husband and son who are her main caregivers.  Treatment and stabilization for septic shock secondary to Klebsiella/blood culture, surgical consult for wound recommendations, no surgical recommendations at this time. Dressstng changes recommended  Today is day five of this hospitalization and she likely will be stable for discharge tomorrow. Family wishes   that she return home with them.       Family face ongoing treatment option decisions, advanced directive decisions and anticipatory care needs within the context of adult failure to thrive and high risk for decompensation.  Clinical Assessment and Goals of Care:   This NP Lorinda Creed reviewed medical records, received report from team, assessed the patient and then meet at the patient's bedside with patient's husband and spoke by telephone with son/Frank to discuss diagnosis, prognosis, GOC, EOL wishes disposition and options.  Husband defers all conversation decisions to his son.   Concept of Palliative Care was introduced as specialized medical care for people and their families living with serious illness.  If focuses on providing relief from the symptoms and stress of a serious illness.   The goal is to improve quality of life for both the patient and the family.  Created space and opportunity for family to explore their thoughts and feelings regarding patient's current medical situation. Patient's son verbalizes that he is fierce when it comes to his mother's care. He verbalizes dissatisfaction in the past hospitalizations and tells me that he has brought that to the attention of patient experience department.  Active listening and emotional support offered.   A  discussion was had today regarding advanced directives.  Concepts specific to code status, artifical feeding and hydration, continued IV antibiotics and rehospitalization was had.  The difference between a aggressive medical intervention path  and a palliative comfort care path for this patient at this time was had.  Values and goals of care important to patient and family were attempted to be elicited.   MOST form, hard choices booklet and my contact information was left in the room.   Natural trajectory and expectations at EOL were discussed.  Questions and concerns addressed.  Patient  encouraged to call with questions or concerns.     PMT will continue to support holistically.           There is no documented ACP documents noted in epic. Son tells me that he is the [facility] and has documents and will bring  them to the hospital for scanning.     SUMMARY OF RECOMMENDATIONS    Code Status/Advance Care Planning:  Full code  Encouraged patient/family to consider DNR/DNI status understanding evidenced based poor outcomes in similar hospitalized patient, as the cause of arrest is likely associated with advanced chronic illness rather than an easily reversible acute cardio-pulmonary event.   Palliative Prophylaxis:   Aspiration, Bowel Regimen, Delirium Protocol, Frequent Pain Assessment and Oral Care  Additional Recommendations (Limitations, Scope, Preferences):  Full Scope Treatment  Psycho-social/Spiritual:    Desire for further Chaplaincy support:yes       Additional Recommendations: Education and recommendation for community-based palliative care services, family is open to this recommendation  Prognosis:   Unable to determine  Discharge Planning: Home with Home Health      Primary Diagnoses: Present on Admission: . Septic shock (HCC) . Atelectasis, right . Pleural effusion on right   I have reviewed the medical record, interviewed the patient and family, and examined the patient. The following aspects are pertinent.  Past Medical History:  Diagnosis Date  . Chronic pain   . Depression   . Diastolic heart failure (HCC)   . HLD (hyperlipidemia)   . HTN (hypertension)   . Hypothyroidism   . Morbid obesity (HCC)   . Recurrent UTI   . Spinal stenosis   . Visual hallucinations    Social History   Socioeconomic History  . Marital status: Married    Spouse name: Not on file  . Number of children: Not on file  . Years of education: Not on file  . Highest education level: Not on file  Occupational History  . Not on file  Tobacco Use  . Smoking status: Never Smoker  . Smokeless tobacco: Former NeurosurgeonUser    Types: Snuff  Vaping Use  . Vaping Use: Never used  Substance and Sexual Activity  . Alcohol use: No  . Drug use: No  . Sexual activity: Not on file  Other Topics Concern  . Not on file  Social History Narrative  . Not on file   Social Determinants of Health   Financial Resource Strain:   . Difficulty of Paying Living Expenses: Not on file  Food Insecurity:   . Worried About Programme researcher, broadcasting/film/videounning Out of Food in the Last Year: Not on file  . Ran Out of Food in the Last Year: Not on file  Transportation Needs:   . Lack of Transportation (Medical): Not on file  . Lack of Transportation (Non-Medical): Not on file  Physical Activity:   . Days of Exercise per Week: Not on file  . Minutes of Exercise per Session: Not on file  Stress:   . Feeling of Stress : Not on file  Social  Connections:   . Frequency of Communication with Friends and Family: Not on file  . Frequency of Social Gatherings with Friends and Family: Not on file  . Attends Religious Services: Not on file  . Active Member of Clubs or Organizations: Not on file  . Attends BankerClub or Organization Meetings: Not on file  . Marital Status: Not on file   Family History  Problem Relation Age of Onset  . Heart disease Father   . Deep vein thrombosis Father    Scheduled Meds: . ARIPiprazole  2 mg Oral Daily  . aspirin EC  81 mg Oral Daily  . baclofen  5 mg Oral TID  . Chlorhexidine Gluconate Cloth  6 each Topical Daily  . enoxaparin (LOVENOX) injection  40 mg Subcutaneous Q24H  . levothyroxine  150 mcg Oral QAC breakfast  . metoprolol succinate  25 mg Oral QPM  . midodrine  5 mg Oral TID WC  . multivitamin-lutein  1 capsule Oral Daily  . pregabalin  100 mg Oral TID  . sodium hypochlorite  1 application Irrigation BID  . traZODone  50 mg Oral QHS  . venlafaxine XR  225 mg Oral Daily  . vitamin B-12  1,000 mcg Oral Daily  . zinc oxide   Topical BID   Continuous Infusions: . cefTRIAXone (ROCEPHIN)  IV 2 g (11/30/19 0408)  . famotidine (PEPCID) IV 20 mg (11/29/19 1740)   PRN Meds:.docusate sodium, polyethylene glycol Medications Prior to Admission:  Prior to Admission medications   Medication Sig Start Date End Date Taking? Authorizing Provider  acetaminophen (TYLENOL) 325 MG tablet Take 650 mg by mouth every 6 (six) hours as needed for mild pain.   Yes [provider]  albuterol (PROVENTIL HFA;VENTOLIN HFA) 108 (90 Base) MCG/ACT inhaler Inhale 2 puffs into the lungs every 4 (four) hours as needed for wheezing or shortness of breath.   Yes [provider]  ARIPiprazole (ABILIFY) 2 MG tablet Take 2 mg by mouth daily.  03/14/17  Yes [provider]  aspirin EC 81 MG tablet Take 81 mg by mouth daily.   Yes [provider]  baclofen 5 MG TABS Take 5 mg by mouth 3  (three) times daily. 10/05/19  Yes Alford Highland, MD  furosemide (LASIX) 20 MG tablet Two tablets daily for one week then can take one tablet daily after that 10/05/19  Yes Wieting, Richard, MD  levothyroxine (SYNTHROID, LEVOTHROID) 150 MCG tablet Take 150 mcg by mouth daily before breakfast.   Yes [provider]  midodrine (PROAMATINE) 10 MG tablet Take 1 tablet (10 mg total) by mouth 3 (three) times daily with meals. 10/05/19  Yes Wieting, Richard, MD  ondansetron (ZOFRAN-ODT) 4 MG disintegrating tablet Take 4 mg by mouth every 8 (eight) hours as needed for nausea or vomiting.   Yes [provider]  potassium chloride (KLOR-CON) 10 MEQ tablet Take 10 mEq by mouth daily.   Yes [provider]  pregabalin (LYRICA) 100 MG capsule Take 100 mg by mouth 3 (three) times daily.    Yes [provider]  traZODone (DESYREL) 50 MG tablet Take 50 mg by mouth at bedtime.   Yes [provider]  Venlafaxine HCl 225 MG TB24 Take 225 mg by mouth daily.   Yes [provider]  vitamin B-12 (CYANOCOBALAMIN) 1000 MCG tablet Take 1,000 mcg by mouth daily.   Yes [provider]   Allergies  Allergen Reactions  . Ambien [Zolpidem Tartrate] Other (See Comments)    Reaction: altered mental status  . Zinc Other (See Comments)    Zinc ointments cause skin irritation   Review of Systems  Unable to perform ROS: Mental status change  Constitutional: Negative for fever.    Physical Exam Constitutional:      Appearance: She is normal weight. She is ill-appearing.  Cardiovascular:     Rate and Rhythm: Normal rate.  Pulmonary:     Effort: Pulmonary effort is normal.  Musculoskeletal:     Comments: Generalized weakness and muscle atrophy  Skin:    Comments: -Noted sacral decubitus per electronic medical records.  Neurological:     Mental Status: She is lethargic.     Vital Signs: BP (!) 122/59 (BP Location: Left Arm)  Pulse 92   Temp 98.6 F (37  C) (Oral)   Resp 18   Ht 5\' 10"  (1.778 m)   Wt 99.3 kg   SpO2 98%   BMI 31.41 kg/m  Pain Scale: (P) 0-10   Pain Score: (P) 0-No pain   SpO2: SpO2: 98 % O2 Device:SpO2: 98 % O2 Flow Rate: .O2 Flow Rate (L/min): 3 L/min  IO: Intake/output summary:   Intake/Output Summary (Last 24 hours) at 11/30/2019 0848 Last data filed at 11/29/2019 1654 Gross per 24 hour  Intake 120 ml  Output 250 ml  Net -130 ml    LBM: Last BM Date: 11/29/19 Baseline Weight: Weight: 97.3 kg Most recent weight: Weight: 99.3 kg     Palliative Assessment/Data:   Discussed with Dr 12/01/19  Time In: 0800 Time Out: 0910 Time Total: 70 minutes Greater than 50%  of this time was spent counseling and coordinating care related to the above assessment and plan.  Signed by: 0911, NP   Please contact Palliative Medicine Team phone at [phone] for questions and concerns.  For individual provider: See [phone]

## 2019-12-01 ENCOUNTER — Encounter: Payer: Medicare Other | Admitting: Physical Medicine and Rehabilitation

## 2019-12-01 DIAGNOSIS — R197 Diarrhea, unspecified: Secondary | ICD-10-CM

## 2019-12-01 DIAGNOSIS — I959 Hypotension, unspecified: Secondary | ICD-10-CM

## 2019-12-01 LAB — CBC WITH DIFFERENTIAL/PLATELET
Abs Immature Granulocytes: 0.06 10*3/uL (ref 0.00–0.07)
Basophils Absolute: 0.1 10*3/uL (ref 0.0–0.1)
Basophils Relative: 1 %
Eosinophils Absolute: 0.3 10*3/uL (ref 0.0–0.5)
Eosinophils Relative: 3 %
HCT: 31.2 % — ABNORMAL LOW (ref 36.0–46.0)
Hemoglobin: 10.2 g/dL — ABNORMAL LOW (ref 12.0–15.0)
Immature Granulocytes: 1 %
Lymphocytes Relative: 25 %
Lymphs Abs: 2.6 10*3/uL (ref 0.7–4.0)
MCH: 30.5 pg (ref 26.0–34.0)
MCHC: 32.7 g/dL (ref 30.0–36.0)
MCV: 93.4 fL (ref 80.0–100.0)
Monocytes Absolute: 1.3 10*3/uL — ABNORMAL HIGH (ref 0.1–1.0)
Monocytes Relative: 13 %
Neutro Abs: 5.7 10*3/uL (ref 1.7–7.7)
Neutrophils Relative %: 57 %
Platelets: 191 10*3/uL (ref 150–400)
RBC: 3.34 MIL/uL — ABNORMAL LOW (ref 3.87–5.11)
RDW: 16.9 % — ABNORMAL HIGH (ref 11.5–15.5)
WBC: 10.1 10*3/uL (ref 4.0–10.5)
nRBC: 0 % (ref 0.0–0.2)

## 2019-12-01 LAB — COMPREHENSIVE METABOLIC PANEL
ALT: 38 U/L (ref 0–44)
AST: 66 U/L — ABNORMAL HIGH (ref 15–41)
Albumin: 2.1 g/dL — ABNORMAL LOW (ref 3.5–5.0)
Alkaline Phosphatase: 119 U/L (ref 38–126)
Anion gap: 7 (ref 5–15)
BUN: <5 mg/dL — ABNORMAL LOW (ref 8–23)
CO2: 26 mmol/L (ref 22–32)
Calcium: 8 mg/dL — ABNORMAL LOW (ref 8.9–10.3)
Chloride: 109 mmol/L (ref 98–111)
Creatinine, Ser: 0.49 mg/dL (ref 0.44–1.00)
GFR calc Af Amer: >60 mL/min (ref >60)
GFR calc non Af Amer: >60 mL/min (ref >60)
Glucose, Bld: 77 mg/dL (ref 70–99)
Potassium: 3.9 mmol/L (ref 3.5–5.1)
Sodium: 142 mmol/L (ref 135–145)
Total Bilirubin: 2 mg/dL — ABNORMAL HIGH (ref 0.3–1.2)
Total Protein: 4.9 g/dL — ABNORMAL LOW (ref 6.5–8.1)

## 2019-12-01 LAB — GLUCOSE, CAPILLARY
Glucose-Capillary: 64 mg/dL — ABNORMAL LOW (ref 70–99)
Glucose-Capillary: 59 mg/dL — ABNORMAL LOW (ref 70–99)
Glucose-Capillary: 110 mg/dL — ABNORMAL HIGH (ref 70–99)
Glucose-Capillary: 96 mg/dL (ref 70–99)
Glucose-Capillary: 105 mg/dL — ABNORMAL HIGH (ref 70–99)

## 2019-12-01 MED ORDER — FUROSEMIDE 40 MG PO TABS
40.0000 mg | ORAL_TABLET | Freq: Every day | ORAL | Status: DC
  Administered 2019-12-02: 40 mg via ORAL
  Filled 2019-12-01: qty 1

## 2019-12-01 MED ORDER — MIDODRINE HCL 5 MG PO TABS: 5.0000 mg | ORAL_TABLET | Freq: Three times a day (TID) | ORAL | 0 refills | Status: AC

## 2019-12-01 MED ORDER — ZINC OXIDE 11.3 % EX CREA: TOPICAL_CREAM | CUTANEOUS | 0 refills | Status: AC

## 2019-12-01 MED ORDER — GLUCOSE 40 % PO GEL
ORAL | Status: AC
  Administered 2019-12-01: 37.5 g
  Filled 2019-12-01: qty 1

## 2019-12-01 MED ORDER — METOPROLOL SUCCINATE ER 25 MG PO TB24
12.5000 mg | ORAL_TABLET | Freq: Every evening | ORAL | 0 refills | Status: DC
Start: 2019-12-01 — End: 2019-12-01

## 2019-12-01 MED ORDER — DAKINS (1/4 STRENGTH) 0.125 % EX SOLN: 1.0000 "application " | Freq: Two times a day (BID) | CUTANEOUS | 0 refills | Status: AC

## 2019-12-01 MED ORDER — CEPHALEXIN 500 MG PO CAPS: 500.0000 mg | ORAL_CAPSULE | Freq: Three times a day (TID) | ORAL | 0 refills | Status: AC

## 2019-12-01 MED ORDER — METOPROLOL SUCCINATE ER 25 MG PO TB24
12.5000 mg | ORAL_TABLET | Freq: Every evening | ORAL | Status: DC
  Administered 2019-12-01: 12.5 mg via ORAL
  Filled 2019-12-01: qty 1

## 2019-12-01 MED ORDER — DAKINS (1/4 STRENGTH) 0.125 % EX SOLN
1.0000 "application " | Freq: Two times a day (BID) | CUTANEOUS | Status: AC
  Administered 2019-12-01 – 2019-12-03 (×4): 1
  Filled 2019-12-01: qty 473

## 2019-12-01 MED ORDER — MIDODRINE HCL 5 MG PO TABS
10.0000 mg | ORAL_TABLET | Freq: Three times a day (TID) | ORAL | Status: DC
  Administered 2019-12-01 – 2019-12-04 (×8): 10 mg via ORAL
  Filled 2019-12-01 (×11): qty 2

## 2019-12-01 NOTE — Progress Notes (Signed)
Patient ID: Jessica [name], female   DOB: [birth year], 74 y.o.   MRN: [id number] [facility] PROGRESS NOTE  Jessica [name] SJG:[id number] DOB: 02/01/1946 DOA: 11/23/2019 PCP: Annita Brod, MD  HPI/Subjective: Answers some yes or no questions. This morning when I saw her she had some applesauce on her tongue and I had to ask her to swallow it. We were planning on discharging today but blood pressure went low this morning and sugars were also low.  Objective: Vitals:   12/01/19 1403 12/01/19 1538  BP: (!) 105/54 (!) 114/54  Pulse: 71 67  Resp:  15  Temp:  97.9 F (36.6 C)  SpO2:  96%    Intake/Output Summary (Last 24 hours) at 12/01/2019 1808 Last data filed at 12/01/2019 1356 Gross per 24 hour  Intake 0 ml  Output 1050 ml  Net -1050 ml   Filed Weights   11/23/2019 1207 11/28/19 0600  Weight: 97.3 kg 99.3 kg    ROS: Review of Systems  Respiratory: Negative for shortness of breath.   Cardiovascular: Negative for chest pain.  Gastrointestinal: Positive for diarrhea. Negative for abdominal pain, nausea and vomiting.   Exam: Physical Exam HENT:     Mouth/Throat:     Pharynx: No oropharyngeal exudate.     Comments: Patient had applesauce on the back of her tongue when I looked in her mouth this morning. Eyes:     General: Lids are normal.     Conjunctiva/sclera: Conjunctivae normal.     Pupils: Pupils are equal, round, and reactive to light.  Cardiovascular:     Rate and Rhythm: Normal rate and regular rhythm.     Heart sounds: Normal heart sounds, S1 normal and S2 normal.  Pulmonary:     Breath sounds: No decreased breath sounds, wheezing or rhonchi.  Abdominal:     Palpations: Abdomen is soft.     Tenderness: There is no abdominal tenderness.  Musculoskeletal:     Right ankle: No swelling.     Left ankle: No swelling.  Skin:    General: Skin is warm.     Findings: No rash.  Neurological:     Mental Status: She is alert.       Data Reviewed: Basic  Metabolic Panel: Recent Labs  Lab 11/26/19 0846 11/26/19 1837 11/27/19 0550 11/28/19 0412 11/29/19 0418 11/29/19 0811 11/30/19 0344 12/01/19 0534  NA 143  --  138 139 140  --  143 142  K 2.8*  --  3.0* 3.7 4.0  --  4.2 3.9  CL 103  --  101 106 109  --  111 109  CO2 28  --  24 25 27   --  26 26  GLUCOSE 84  --  68* 116* 93  --  99 77  BUN 8  --  7* <5* <5*  --  <5* <5*  CREATININE 0.69   < > 0.59 0.52 0.35*  --  0.49 0.49  CALCIUM 8.3*  --  7.9* 8.0* 7.9*  --  8.0* 8.0*  MG 1.7  --  1.7 2.0  --  1.9  --   --   PHOS 3.1  --  2.7 2.2*  --  2.4*  --   --    < > = values in this interval not displayed.   Liver Function Tests: Recent Labs  Lab 11/27/19 0550 11/28/19 0412 11/29/19 0418 11/30/19 0344 12/01/19 0534  AST 61* 84* 104* 86* 66*  ALT 30 35 41 41 38  ALKPHOS 126 128* 126 128* 119  BILITOT 2.3* 2.1* 2.1* 2.7* 2.0*  PROT 5.2* 5.1* 4.7* 5.0* 4.9*  ALBUMIN 2.5* 2.6* 2.2* 2.2* 2.1*    CBC: Recent Labs  Lab 11/27/19 0550 11/28/19 0412 11/29/19 0418 11/30/19 0344 12/01/19 0534  WBC 12.9* 9.9 11.3* 12.2* 10.1  NEUTROABS 9.7* 7.0 7.0 7.4 5.7  HGB 8.6* 8.6* 9.0* 9.5* 10.2*  HCT 25.0* 24.6* 27.5* 29.3* 31.2*  MCV 90.9 90.1 92.9 94.2 93.4  PLT 188 169 180 200 191    CBG: Recent Labs  Lab 11/30/19 2125 12/01/19 0900 12/01/19 0932 12/01/19 1134 12/01/19 1625  GLUCAP 77 64* 59* 110* 96    Recent Results (from the past 240 hour(s))  Culture, blood (Routine x 2)     Status: Abnormal   Collection Time: Nov 30, 2019 10:21 AM   Specimen: BLOOD  Result Value Ref Range Status   Specimen Description   Final    BLOOD RIGHT ANTECUBITAL Performed at [facility], [address], [place], [place] [address]    Special Requests   Final    BOTTLES DRAWN AEROBIC AND ANAEROBIC Blood Culture results may not be optimal due to an excessive volume of blood received in culture bottles Performed at [facility], [address] [place], [place] [address]     Culture  Setup Time   Final    Organism ID to follow GRAM NEGATIVE RODS IN BOTH AEROBIC AND ANAEROBIC BOTTLES CRITICAL RESULT CALLED TO, READ BACK BY AND VERIFIED WITH: DAVID BESANTI 11/26/19 AT 0255 HS Performed at [facility], [address] [place], [place] [address]    Culture KLEBSIELLA PNEUMONIAE (A)  Final   Report Status 11/28/2019 FINAL  Final   Organism ID, Bacteria KLEBSIELLA PNEUMONIAE  Final      Susceptibility   Klebsiella pneumoniae - MIC*    AMPICILLIN RESISTANT Resistant     CEFAZOLIN <=4 SENSITIVE Sensitive     CEFEPIME <=0.12 SENSITIVE Sensitive     CEFTAZIDIME <=1 SENSITIVE Sensitive     CEFTRIAXONE <=0.25 SENSITIVE Sensitive     CIPROFLOXACIN <=0.25 SENSITIVE Sensitive     GENTAMICIN <=1 SENSITIVE Sensitive     IMIPENEM <=0.25 SENSITIVE Sensitive     TRIMETH/SULFA <=20 SENSITIVE Sensitive     AMPICILLIN/SULBACTAM <=2 SENSITIVE Sensitive     PIP/TAZO <=4 SENSITIVE Sensitive     * KLEBSIELLA PNEUMONIAE  Culture, blood (Routine x 2)     Status: Abnormal   Collection Time: 30-Nov-2019 10:21 AM   Specimen: BLOOD  Result Value Ref Range Status   Specimen Description   Final    BLOOD RH Performed at [facility], [address], [place], [place] [address]    Special Requests   Final    BOTTLES DRAWN AEROBIC AND ANAEROBIC Blood Culture results may not be optimal due to an excessive volume of blood received in culture bottles Performed at [facility], [address] [place], [place] [address]    Culture  Setup Time   Final    GRAM NEGATIVE RODS AEROBIC BOTTLE ONLY CRITICAL VALUE NOTED.  VALUE IS CONSISTENT WITH PREVIOUSLY REPORTED AND CALLED VALUE. Performed at [facility], [address] [place], [place] [address]    Culture (A)  Final    KLEBSIELLA PNEUMONIAE SUSCEPTIBILITIES PERFORMED ON PREVIOUS CULTURE WITHIN THE LAST 5 DAYS. Performed at [facility], [address], [place], [place] [address]    Report  Status 11/28/2019 FINAL  Final  Urine culture  Status: Abnormal   Collection Time: 2019-12-10 10:21 AM   Specimen: In/Out Cath Urine  Result Value Ref Range Status   Specimen Description   Final    IN/OUT CATH URINE Performed at [facility], [address] [place], [place] [address]    Special Requests   Final    NONE Performed at [facility], [address] [place], [place] [address]    Culture (A)  Final    >=100,000 COLONIES/mL PROTEUS MIRABILIS 30,000 COLONIES/mL CITROBACTER FREUNDII    Report Status 11/29/2019 FINAL  Final   Organism ID, Bacteria PROTEUS MIRABILIS (A)  Final   Organism ID, Bacteria CITROBACTER FREUNDII (A)  Final      Susceptibility   Citrobacter freundii - MIC*    CEFAZOLIN >=64 RESISTANT Resistant     CEFTRIAXONE <=0.25 SENSITIVE Sensitive     CIPROFLOXACIN <=0.25 SENSITIVE Sensitive     GENTAMICIN <=1 SENSITIVE Sensitive     IMIPENEM 0.5 SENSITIVE Sensitive     NITROFURANTOIN <=16 SENSITIVE Sensitive     TRIMETH/SULFA <=20 SENSITIVE Sensitive     PIP/TAZO <=4 SENSITIVE Sensitive     * 30,000 COLONIES/mL CITROBACTER FREUNDII   Proteus mirabilis - MIC*    AMPICILLIN >=32 RESISTANT Resistant     CEFAZOLIN >=64 RESISTANT Resistant     CEFTRIAXONE 32 RESISTANT Resistant     CIPROFLOXACIN >=4 RESISTANT Resistant     GENTAMICIN >=16 RESISTANT Resistant     IMIPENEM 2 SENSITIVE Sensitive     NITROFURANTOIN 128 RESISTANT Resistant     TRIMETH/SULFA >=320 RESISTANT Resistant     AMPICILLIN/SULBACTAM >=32 RESISTANT Resistant     PIP/TAZO <=4 SENSITIVE Sensitive     * >=100,000 COLONIES/mL PROTEUS MIRABILIS  Blood Culture ID Panel (Reflexed)     Status: Abnormal   Collection Time: 2019/12/10 10:21 AM  Result Value Ref Range Status   Enterococcus faecalis NOT DETECTED NOT DETECTED Final   Enterococcus Faecium NOT DETECTED NOT DETECTED Final   Listeria monocytogenes NOT DETECTED NOT DETECTED Final   Staphylococcus species NOT DETECTED  NOT DETECTED Final   Staphylococcus aureus (BCID) NOT DETECTED NOT DETECTED Final   Staphylococcus epidermidis NOT DETECTED NOT DETECTED Final   Staphylococcus lugdunensis NOT DETECTED NOT DETECTED Final   Streptococcus species NOT DETECTED NOT DETECTED Final   Streptococcus agalactiae NOT DETECTED NOT DETECTED Final   Streptococcus pneumoniae NOT DETECTED NOT DETECTED Final   Streptococcus pyogenes NOT DETECTED NOT DETECTED Final   A.calcoaceticus-baumannii NOT DETECTED NOT DETECTED Final   Bacteroides fragilis NOT DETECTED NOT DETECTED Final   Enterobacterales DETECTED (A) NOT DETECTED Final    Comment: Enterobacterales represent a large order of gram negative bacteria, not a single organism. CRITICAL RESULT CALLED TO, READ BACK BY AND VERIFIED WITH: DAVID BESANTI 11/26/19 AT 0255 HS    Enterobacter cloacae complex NOT DETECTED NOT DETECTED Final   Escherichia coli NOT DETECTED NOT DETECTED Final   Klebsiella aerogenes NOT DETECTED NOT DETECTED Final   Klebsiella oxytoca NOT DETECTED NOT DETECTED Final   Klebsiella pneumoniae DETECTED (A) NOT DETECTED Final    Comment: CRITICAL RESULT CALLED TO, READ BACK BY AND VERIFIED WITH: DAVID BESANTI 11/26/19 AT 0255 HS    Proteus species NOT DETECTED NOT DETECTED Final   Salmonella species NOT DETECTED NOT DETECTED Final   Serratia marcescens NOT DETECTED NOT DETECTED Final   Haemophilus influenzae NOT DETECTED NOT DETECTED Final   Neisseria meningitidis NOT DETECTED NOT DETECTED Final   Pseudomonas aeruginosa NOT DETECTED NOT DETECTED  Final   Stenotrophomonas maltophilia NOT DETECTED NOT DETECTED Final   Candida albicans NOT DETECTED NOT DETECTED Final   Candida auris NOT DETECTED NOT DETECTED Final   Candida glabrata NOT DETECTED NOT DETECTED Final   Candida krusei NOT DETECTED NOT DETECTED Final   Candida parapsilosis NOT DETECTED NOT DETECTED Final   Candida tropicalis NOT DETECTED NOT DETECTED Final   Cryptococcus neoformans/gattii  NOT DETECTED NOT DETECTED Final   CTX-M ESBL NOT DETECTED NOT DETECTED Final   Carbapenem resistance IMP NOT DETECTED NOT DETECTED Final   Carbapenem resistance KPC NOT DETECTED NOT DETECTED Final   Carbapenem resistance NDM NOT DETECTED NOT DETECTED Final   Carbapenem resist OXA 48 LIKE NOT DETECTED NOT DETECTED Final   Carbapenem resistance VIM NOT DETECTED NOT DETECTED Final    Comment: Performed at [facility], [address] [place], [place] [address]  SARS Coronavirus 2 by RT PCR (hospital order, performed in [facility] hospital lab) Nasopharyngeal Nasopharyngeal Swab     Status: None   Collection Time: 12/04/2019 12:39 PM   Specimen: Nasopharyngeal Swab  Result Value Ref Range Status   SARS Coronavirus 2 NEGATIVE NEGATIVE Final    Comment: (NOTE) SARS-CoV-2 target nucleic acids are NOT DETECTED.  The SARS-CoV-2 RNA is generally detectable in upper and lower respiratory specimens during the acute phase of infection. The lowest concentration of SARS-CoV-2 viral copies this assay can detect is 250 copies / mL. A negative result does not preclude SARS-CoV-2 infection and should not be used as the sole basis for treatment or other patient management decisions.  A negative result may occur with improper specimen collection / handling, submission of specimen other than nasopharyngeal swab, presence of viral mutation(s) within the areas targeted by this assay, and inadequate number of viral copies (<250 copies / mL). A negative result must be combined with clinical observations, patient history, and epidemiological information.  Fact Sheet for Patients:   BoilerBrush.com.cy  Fact Sheet for Healthcare Providers: https://pope.com/  This test is not yet approved or  cleared by the [place] FDA and has been authorized for detection and/or diagnosis of SARS-CoV-2 by FDA under an Emergency Use Authorization (EUA).  This EUA  will remain in effect (meaning this test can be used) for the duration of the COVID-19 declaration under Section 564(b)(1) of the Act, 21 U.S.C. section 360bbb-3(b)(1), unless the authorization is terminated or revoked sooner.  Performed at [facility], [address] [place], [place] [address]   C Difficile Quick Screen w PCR reflex     Status: None   Collection Time: 11/27/19  6:18 AM   Specimen: STOOL  Result Value Ref Range Status   C Diff antigen NEGATIVE NEGATIVE Final   C Diff toxin NEGATIVE NEGATIVE Final   C Diff interpretation No C. difficile detected.  Final    Comment: Performed at [facility], [address] [place], [place] [address]  MRSA PCR Screening     Status: None   Collection Time: 11/27/19 11:30 PM   Specimen: Nasal Mucosa; Nasopharyngeal  Result Value Ref Range Status   MRSA by PCR NEGATIVE NEGATIVE Final    Comment:        The GeneXpert MRSA Assay (FDA approved for NASAL specimens only), is one component of a comprehensive MRSA colonization surveillance program. It is not intended to diagnose MRSA infection nor to guide or monitor treatment for MRSA infections. Performed at [facility], [address], [place], [place] [address]  CULTURE, BLOOD (ROUTINE X 2) w Reflex to ID Panel     Status: None (Preliminary result)   Collection Time: 11/28/19 11:27 AM   Specimen: BLOOD  Result Value Ref Range Status   Specimen Description BLOOD LEFT ARM  Final   Special Requests   Final    BOTTLES DRAWN AEROBIC AND ANAEROBIC Blood Culture adequate volume   Culture   Final    NO GROWTH 3 DAYS Performed at [facility], [address] [place], [place] [address]    Report Status PENDING  Incomplete  CULTURE, BLOOD (ROUTINE X 2) w Reflex to ID Panel     Status: None (Preliminary result)   Collection Time: 11/28/19 11:49 AM   Specimen: BLOOD  Result Value Ref Range Status   Specimen Description BLOOD RIGHT ARM  Final    Special Requests   Final    BOTTLES DRAWN AEROBIC AND ANAEROBIC Blood Culture adequate volume   Culture   Final    NO GROWTH 3 DAYS Performed at [facility], [address] [place], [place] [address]    Report Status PENDING  Incomplete     Scheduled Meds: . ARIPiprazole  2 mg Oral Daily  . aspirin EC  81 mg Oral Daily  . baclofen  5 mg Oral TID  . Chlorhexidine Gluconate Cloth  6 each Topical Daily  . enoxaparin (LOVENOX) injection  40 mg Subcutaneous Q24H  . famotidine  20 mg Oral Daily  . furosemide  40 mg Oral Daily  . levothyroxine  150 mcg Oral QAC breakfast  . metoprolol succinate  12.5 mg Oral QPM  . midodrine  10 mg Oral TID WC  . multivitamin-lutein  1 capsule Oral Daily  . pregabalin  100 mg Oral TID  . sodium hypochlorite  1 application Irrigation BID  . traZODone  50 mg Oral QHS  . venlafaxine XR  225 mg Oral Daily  . vitamin B-12  1,000 mcg Oral Daily  . zinc oxide   Topical BID   Continuous Infusions: . cefTRIAXone (ROCEPHIN)  IV 2 g (12/01/19 0318)    Assessment/Plan:  1. Hypotension today. Increase midodrine to 10 mg 3 times daily. 2. Septic shock secondary to Klebsiella. Continue Rocephin and switch over to Keflex upon discharge home. Urine culture likely contamination. Foley catheter changed during this hospital stay. 3. 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 Continue dressing changes and wound care. 4. Acute encephalopathy secondary to sepsis. Patient was a little more lethargic this morning than usual. But I did see her earlier in the day. 5. Tachycardia has resolved discontinue Toprol-XL since blood pressure lower today. 6. Dysphagia. Patient on dysphagia 1 diet with nectar thick liquids. 7. Severe protein calorie malnutrition 8. Palliative to follow as outpatient 9. Hypothyroidism unspecified on levothyroxine 10. Diarrhea last night continue to monitor. 11. Low sugar. Once done with the ear it was better. Patient not a diabetic.  Likely getting falsely low readings from fingersticks  Pressure Injury 09/29/19 Leg Left;Posterior;Lower Stage 2 -  Partial thickness loss of dermis presenting as a shallow open injury with a red, pink wound bed without slough. three small, round stage two pressure ulcers. (Active)  09/29/19 1445  Location: Leg  Location Orientation: Left;Posterior;Lower  Staging: Stage 2 -  Partial thickness loss of dermis presenting as a shallow open injury with a red, pink wound bed without slough.  Wound Description (Comments): three small, round stage two pressure ulcers.  Present on Admission: Yes  Pressure Injury 11/27/19 Sacrum Stage 4 - Full thickness tissue loss with exposed bone, tendon or muscle. (Active)  11/27/19 2100  Location: Sacrum  Location Orientation:   Staging: Stage 4 - Full thickness tissue loss with exposed bone, tendon or muscle.  Wound Description (Comments):   Present on Admission: Yes     Pressure Injury 11/28/19 Buttocks Upper Stage 4 - Full thickness tissue loss with exposed bone, tendon or muscle. (Active)  11/28/19   Location: Buttocks  Location Orientation: Upper  Staging: Stage 4 - Full thickness tissue loss with exposed bone, tendon or muscle.  Wound Description (Comments):   Present on Admission: Yes       Code Status:     Code Status Orders  (From admission, onward)         Start     Ordered   12/04/2019 1459  Full code  Continuous        12/07/2019 1459        Code Status History    Date Active Date Inactive Code Status Order ID Comments User Context   09/29/2019 1309 10/05/2019 2114 Full Code [id number]  Salena SanerGonzalez, Carmen L, MD ED   01/15/2017 2127 02/11/2017 1733 Full Code [id number]  Houston SirenSainani, Vivek J, MD ED   01/01/2017 2006 01/03/2017 1805 Full Code [id number]  Shaune Pollackhen, Qing, MD ED   12/19/2016 0135 12/20/2016 1557 Full Code [id number]  Oralia ManisWillis, David, MD Inpatient   09/12/2016 0513 09/13/2016 1812 Full Code [id number]  Arnaldo Nataliamond, Michael S, MD Inpatient   08/14/2016  1930 08/15/2016 1942 Full Code [id number]  Alford HighlandWieting, Oluwanifemi Susman, MD ED   07/06/2016 0749 07/07/2016 2105 Full Code [id number]  Arnaldo Nataliamond, Michael S, MD Inpatient   Advance Care Planning Activity    Advance Directive Documentation     Most Recent Value  Type of Advance Directive Healthcare Power of Attorney  Pre-existing out of facility DNR order (yellow form or pink MOST form) --  "MOST" Form in Place? --     Family Communication: Spoke with son at the bedside and again on the phone in the afternoon Disposition Plan: Status is: Inpatient  Dispo:  Patient From: Home  Planned Disposition: Home with Health Care Svc  Expected discharge date: 12/02/2019  Medically stable for discharge: With blood pressure dropping down and low sugars this morning I increased midodrine and discontinue Toprol-XL. Reevaluate for discharge tomorrow.  Time spent: 34 minutes  Jessica [name] [employer]  [facility]

## 2019-12-01 NOTE — Progress Notes (Signed)
Pt's BG 92 when stuck on pt's ear. BP rechecked 109/64

## 2019-12-01 NOTE — Care Management Important Message (Signed)
Important Message  Patient Details  Name: Jessica [name] MRN: [id number] Date of Birth: 14-Feb-1946   Medicare Important Message Given:  Yes     Johnell Comings 12/01/2019, 11:31 AM

## 2019-12-01 NOTE — Progress Notes (Signed)
Pt sleeping. 

## 2019-12-01 NOTE — Progress Notes (Signed)
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

## 2019-12-01 NOTE — Progress Notes (Signed)
Pt's BS 64, Juice given. Tech checked BS again, BS 59. RN gave oral glucagon. Will recheck in 15 mins. Per pt's husband pt seems slughish.

## 2019-12-01 NOTE — Plan of Care (Signed)

## 2019-12-01 NOTE — TOC Progression Note (Signed)
Transition of Care [facility]) - Progression Note    Patient Details  Name: Jessica [name] MRN: [id number] Date of Birth: [birth year]  [facility] CM/SW Contact  Shelbie Ammons, RN Phone Number: 12/01/2019, 9:37 AM  Clinical Narrative:   RNCM met with patient's son at bedside. It is there plan to take patient home today by EMS. They already have services through [facility] and it is there plan to continue those services. RNCM will complete EMS paperwork and place with chart. No other needs identified at this time.     Expected Discharge Plan: [place] Barriers to Discharge: No Barriers Identified  Expected Discharge Plan and Services Expected Discharge Plan: [place] In-house Referral: Clinical Social Work   Post Acute Care Choice: NA Living arrangements for the past 2 months: Single Family Home Expected Discharge Date: 12/01/19                                     Social Determinants of Health (SDOH) Interventions    Readmission Risk Interventions Readmission Risk Prevention Plan 12/01/2019  Transportation Screening Complete  PCP or Specialist Appt within 3-5 Days Complete  HRI or [place] Complete  Social Work Consult for [place] Planning/Counseling Complete  Palliative Care Screening Not Applicable  Medication Review Press photographer) Complete  Some recent data might be hidden

## 2019-12-01 NOTE — Consult Note (Signed)
WOC Nurse wound follow up Refer to previous WOC consult note on 8/17. 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 Surgical team performed a consult on 8/18 and requested to continue this plan of care. Dakins re-order placed for 3 more days while in the hospital. Pt should resume follow-up at the outpatient wound care center after discharge.  Cammie Mcgee MSN, RN, CWOCN, [place], CNS [phone]

## 2019-12-02 ENCOUNTER — Inpatient Hospital Stay: Payer: Medicare Other

## 2019-12-02 DIAGNOSIS — I5031 Acute diastolic (congestive) heart failure: Secondary | ICD-10-CM

## 2019-12-02 LAB — BASIC METABOLIC PANEL
Anion gap: 8 (ref 5–15)
BUN: 6 mg/dL — ABNORMAL LOW (ref 8–23)
CO2: 27 mmol/L (ref 22–32)
Calcium: 8 mg/dL — ABNORMAL LOW (ref 8.9–10.3)
Chloride: 106 mmol/L (ref 98–111)
Creatinine, Ser: 0.55 mg/dL (ref 0.44–1.00)
GFR calc Af Amer: >60 mL/min (ref >60)
GFR calc non Af Amer: >60 mL/min (ref >60)
Glucose, Bld: 100 mg/dL — ABNORMAL HIGH (ref 70–99)
Potassium: 3.6 mmol/L (ref 3.5–5.1)
Sodium: 141 mmol/L (ref 135–145)

## 2019-12-02 LAB — GLUCOSE, CAPILLARY
Glucose-Capillary: 77 mg/dL (ref 70–99)
Glucose-Capillary: 80 mg/dL (ref 70–99)
Glucose-Capillary: 73 mg/dL (ref 70–99)
Glucose-Capillary: 84 mg/dL (ref 70–99)

## 2019-12-02 LAB — CBC
HCT: 29.2 % — ABNORMAL LOW (ref 36.0–46.0)
Hemoglobin: 9.8 g/dL — ABNORMAL LOW (ref 12.0–15.0)
MCH: 30.6 pg (ref 26.0–34.0)
MCHC: 33.6 g/dL (ref 30.0–36.0)
MCV: 91.3 fL (ref 80.0–100.0)
Platelets: 268 10*3/uL (ref 150–400)
RBC: 3.2 MIL/uL — ABNORMAL LOW (ref 3.87–5.11)
RDW: 16.7 % — ABNORMAL HIGH (ref 11.5–15.5)
WBC: 15.1 10*3/uL — ABNORMAL HIGH (ref 4.0–10.5)
nRBC: 0 % (ref 0.0–0.2)

## 2019-12-02 MED ORDER — TRAZODONE HCL 50 MG PO TABS
50.0000 mg | ORAL_TABLET | Freq: Every day | ORAL | Status: DC
  Administered 2019-12-02 – 2019-12-04 (×3): 50 mg via ORAL
  Filled 2019-12-02 (×3): qty 1

## 2019-12-02 MED ORDER — FUROSEMIDE 20 MG PO TABS
20.0000 mg | ORAL_TABLET | Freq: Two times a day (BID) | ORAL | Status: DC
  Administered 2019-12-02: 20 mg via ORAL
  Filled 2019-12-02 (×2): qty 1

## 2019-12-02 MED ORDER — TRAZODONE HCL 100 MG PO TABS: 100.0000 mg | ORAL_TABLET | Freq: Every day | ORAL | Status: DC

## 2019-12-02 MED ORDER — HALOPERIDOL LACTATE 5 MG/ML IJ SOLN: 1.0000 mg | Freq: Four times a day (QID) | INTRAMUSCULAR | Status: DC | PRN

## 2019-12-02 MED ORDER — POTASSIUM CHLORIDE CRYS ER 10 MEQ PO TBCR
10.0000 meq | EXTENDED_RELEASE_TABLET | Freq: Two times a day (BID) | ORAL | Status: DC
  Administered 2019-12-03: 10 meq via ORAL
  Filled 2019-12-02 (×3): qty 1

## 2019-12-02 MED ORDER — TRAMADOL HCL 50 MG PO TABS
50.0000 mg | ORAL_TABLET | Freq: Four times a day (QID) | ORAL | Status: DC | PRN
  Administered 2019-12-04: 50 mg via ORAL
  Filled 2019-12-02: qty 1

## 2019-12-02 NOTE — Progress Notes (Signed)
RN to room to check BP and give medications. RN checked BP and pt preceded to curse RN out.Pt stated to RN that she did not want to get better, pt confused about who nurse was. Pt refusing to take medication. RN notified Attending about pt's situation, elevated WBCs and asked about palliative coming back to see pt again. Waiting on Md's response

## 2019-12-02 NOTE — Progress Notes (Signed)
Patient ID: Jessica [name], female   DOB: [birth year], [age] y.o.   MRN: [id number] [facility] PROGRESS NOTE  Jessica Bradyhyllis W Mirarchi ATF:[id number] DOB: [birth year] DOA: 10-15-2019 PCP: Annita BrodAsenso, Philip, MD  HPI/Subjective: Patient never has any complaints.  Answers no to every question. Was admitted initially with sepsis.  This morning her blood pressure was 80/30 prior to midodrine being given.  Family also concerned about the swelling that she has been having.  Objective: Vitals:   12/02/19 1313 12/02/19 1521  BP: (!) 101/39 (!) 106/45  Pulse: 83 75  Resp: 14 16  Temp: 98.3 F (36.8 C) 97.7 F (36.5 C)  SpO2: 100% 100%    Intake/Output Summary (Last 24 hours) at 12/02/2019 1609 Last data filed at 12/02/2019 [id number] Gross per 24 hour  Intake --  Output 275 ml  Net -275 ml   Filed Weights   2020/02/25 1207 11/28/19 0600  Weight: 97.3 kg 99.3 kg    ROS: Review of Systems  Respiratory: Negative for shortness of breath.   Cardiovascular: Negative for chest pain.  Gastrointestinal: Negative for abdominal pain, diarrhea, nausea and vomiting.   Exam: Physical Exam HENT:     Nose: No mucosal edema.     Mouth/Throat:     Pharynx: No oropharyngeal exudate.  Eyes:     General: Lids are normal.     Conjunctiva/sclera: Conjunctivae normal.     Pupils: Pupils are equal, round, and reactive to light.  Cardiovascular:     Rate and Rhythm: Normal rate and regular rhythm.     Heart sounds: Normal heart sounds, S1 normal and S2 normal.  Pulmonary:     Breath sounds: Examination of the right-lower field reveals decreased breath sounds. Examination of the left-lower field reveals decreased breath sounds. Decreased breath sounds present. No wheezing, rhonchi or rales.  Abdominal:     Palpations: Abdomen is soft.     Tenderness: There is no abdominal tenderness.  Musculoskeletal:     Right lower leg: Swelling present.     Left lower leg: Swelling present.  Skin:    General: Skin is warm.      Findings: No rash.  Neurological:     Mental Status: She is alert.     Comments: Answer some yes or no questions.       Data Reviewed: Basic Metabolic Panel: Recent Labs  Lab 11/26/19 0846 11/26/19 1837 11/27/19 0550 11/27/19 0550 11/28/19 0412 11/29/19 0418 11/29/19 0811 11/30/19 0344 12/01/19 0534 12/02/19 1446  NA 143  --  138   < > 139 140  --  143 142 141  K 2.8*  --  3.0*   < > 3.7 4.0  --  4.2 3.9 3.6  CL 103  --  101   < > 106 109  --  111 109 106  CO2 28  --  24   < > 25 27  --  26 26 27   GLUCOSE 84  --  68*   < > 116* 93  --  99 77 100*  BUN 8  --  7*   < > <5* <5*  --  <5* <5* 6*  CREATININE 0.69   < > 0.59   < > 0.52 0.35*  --  0.49 0.49 0.55  CALCIUM 8.3*  --  7.9*   < > 8.0* 7.9*  --  8.0* 8.0* 8.0*  MG 1.7  --  1.7  --  2.0  --  1.9  --   --   --  PHOS 3.1  --  2.7  --  2.2*  --  2.4*  --   --   --    < > = values in this interval not displayed.   Liver Function Tests: Recent Labs  Lab 11/27/19 0550 11/28/19 0412 11/29/19 0418 11/30/19 0344 12/01/19 0534  AST 61* 84* 104* 86* 66*  ALT 30 35 41 41 38  ALKPHOS 126 128* 126 128* 119  BILITOT 2.3* 2.1* 2.1* 2.7* 2.0*  PROT 5.2* 5.1* 4.7* 5.0* 4.9*  ALBUMIN 2.5* 2.6* 2.2* 2.2* 2.1*   CBC: Recent Labs  Lab 11/27/19 0550 11/27/19 0550 11/28/19 0412 11/29/19 0418 11/30/19 0344 12/01/19 0534 12/02/19 1446  WBC 12.9*   < > 9.9 11.3* 12.2* 10.1 15.1*  NEUTROABS 9.7*  --  7.0 7.0 7.4 5.7  --   HGB 8.6*   < > 8.6* 9.0* 9.5* 10.2* 9.8*  HCT 25.0*   < > 24.6* 27.5* 29.3* 31.2* 29.2*  MCV 90.9   < > 90.1 92.9 94.2 93.4 91.3  PLT 188   < > 169 180 200 191 268   < > = values in this interval not displayed.    CBG: Recent Labs  Lab 12/01/19 1134 12/01/19 1625 12/01/19 2114 12/02/19 0749 12/02/19 1149  GLUCAP 110* 96 105* 77 80    Recent Results (from the past 240 hour(s))  Culture, blood (Routine x 2)     Status: Abnormal   Collection Time: 12/19/2019 10:21 AM   Specimen: BLOOD   Result Value Ref Range Status   Specimen Description   Final    BLOOD RIGHT ANTECUBITAL Performed at [facility], [address], [place], [place] [address]    Special Requests   Final    BOTTLES DRAWN AEROBIC AND ANAEROBIC Blood Culture results may not be optimal due to an excessive volume of blood received in culture bottles Performed at [facility], [address] [place], [place] [address]    Culture  Setup Time   Final    Organism ID to follow GRAM NEGATIVE RODS IN BOTH AEROBIC AND ANAEROBIC BOTTLES CRITICAL RESULT CALLED TO, READ BACK BY AND VERIFIED WITH: DAVID BESANTI 11/26/19 AT 0255 HS Performed at [facility], [address] [place], [place] [address]    Culture KLEBSIELLA PNEUMONIAE (A)  Final   Report Status 11/28/2019 FINAL  Final   Organism ID, Bacteria KLEBSIELLA PNEUMONIAE  Final      Susceptibility   Klebsiella pneumoniae - MIC*    AMPICILLIN RESISTANT Resistant     CEFAZOLIN <=4 SENSITIVE Sensitive     CEFEPIME <=0.12 SENSITIVE Sensitive     CEFTAZIDIME <=1 SENSITIVE Sensitive     CEFTRIAXONE <=0.25 SENSITIVE Sensitive     CIPROFLOXACIN <=0.25 SENSITIVE Sensitive     GENTAMICIN <=1 SENSITIVE Sensitive     IMIPENEM <=0.25 SENSITIVE Sensitive     TRIMETH/SULFA <=20 SENSITIVE Sensitive     AMPICILLIN/SULBACTAM <=2 SENSITIVE Sensitive     PIP/TAZO <=4 SENSITIVE Sensitive     * KLEBSIELLA PNEUMONIAE  Culture, blood (Routine x 2)     Status: Abnormal   Collection Time: December 19, 2019 10:21 AM   Specimen: BLOOD  Result Value Ref Range Status   Specimen Description   Final    BLOOD RH Performed at [facility], [address], [place], [place] [address]    Special Requests   Final    BOTTLES DRAWN AEROBIC AND ANAEROBIC Blood Culture results may not be optimal due to  an excessive volume of blood received in culture bottles Performed at [facility], [address] [place], [place] [address]    Culture  Setup Time    Final    GRAM NEGATIVE RODS AEROBIC BOTTLE ONLY CRITICAL VALUE NOTED.  VALUE IS CONSISTENT WITH PREVIOUSLY REPORTED AND CALLED VALUE. Performed at [facility], [address] [place], [place] [address]    Culture (A)  Final    KLEBSIELLA PNEUMONIAE SUSCEPTIBILITIES PERFORMED ON PREVIOUS CULTURE WITHIN THE LAST 5 DAYS. Performed at [facility], [address], [place], [place] [address]    Report Status 11/28/2019 FINAL  Final  Urine culture     Status: Abnormal   Collection Time: 11/17/2019 10:21 AM   Specimen: In/Out Cath Urine  Result Value Ref Range Status   Specimen Description   Final    IN/OUT CATH URINE Performed at [facility], [address] [place], [place] [address]    Special Requests   Final    NONE Performed at [facility], [address] [place], [place] [address]    Culture (A)  Final    >=100,000 COLONIES/mL PROTEUS MIRABILIS 30,000 COLONIES/mL CITROBACTER FREUNDII    Report Status 11/29/2019 FINAL  Final   Organism ID, Bacteria PROTEUS MIRABILIS (A)  Final   Organism ID, Bacteria CITROBACTER FREUNDII (A)  Final      Susceptibility   Citrobacter freundii - MIC*    CEFAZOLIN >=64 RESISTANT Resistant     CEFTRIAXONE <=0.25 SENSITIVE Sensitive     CIPROFLOXACIN <=0.25 SENSITIVE Sensitive     GENTAMICIN <=1 SENSITIVE Sensitive     IMIPENEM 0.5 SENSITIVE Sensitive     NITROFURANTOIN <=16 SENSITIVE Sensitive     TRIMETH/SULFA <=20 SENSITIVE Sensitive     PIP/TAZO <=4 SENSITIVE Sensitive     * 30,000 COLONIES/mL CITROBACTER FREUNDII   Proteus mirabilis - MIC*    AMPICILLIN >=32 RESISTANT Resistant     CEFAZOLIN >=64 RESISTANT Resistant     CEFTRIAXONE 32 RESISTANT Resistant     CIPROFLOXACIN >=4 RESISTANT Resistant     GENTAMICIN >=16 RESISTANT Resistant     IMIPENEM 2 SENSITIVE Sensitive     NITROFURANTOIN 128 RESISTANT Resistant     TRIMETH/SULFA >=320 RESISTANT Resistant     AMPICILLIN/SULBACTAM >=32 RESISTANT  Resistant     PIP/TAZO <=4 SENSITIVE Sensitive     * >=100,000 COLONIES/mL PROTEUS MIRABILIS  Blood Culture ID Panel (Reflexed)     Status: Abnormal   Collection Time: 12/01/2019 10:21 AM  Result Value Ref Range Status   Enterococcus faecalis NOT DETECTED NOT DETECTED Final   Enterococcus Faecium NOT DETECTED NOT DETECTED Final   Listeria monocytogenes NOT DETECTED NOT DETECTED Final   Staphylococcus species NOT DETECTED NOT DETECTED Final   Staphylococcus aureus (BCID) NOT DETECTED NOT DETECTED Final   Staphylococcus epidermidis NOT DETECTED NOT DETECTED Final   Staphylococcus lugdunensis NOT DETECTED NOT DETECTED Final   Streptococcus species NOT DETECTED NOT DETECTED Final   Streptococcus agalactiae NOT DETECTED NOT DETECTED Final   Streptococcus pneumoniae NOT DETECTED NOT DETECTED Final   Streptococcus pyogenes NOT DETECTED NOT DETECTED Final   A.calcoaceticus-baumannii NOT DETECTED NOT DETECTED Final   Bacteroides fragilis NOT DETECTED NOT DETECTED Final   Enterobacterales DETECTED (A) NOT DETECTED Final    Comment: Enterobacterales represent a large order of gram negative bacteria, not a single organism. CRITICAL RESULT CALLED TO, READ BACK BY AND VERIFIED WITH: DAVID BESANTI 11/26/19 AT 0255 HS    Enterobacter  cloacae complex NOT DETECTED NOT DETECTED Final   Escherichia coli NOT DETECTED NOT DETECTED Final   Klebsiella aerogenes NOT DETECTED NOT DETECTED Final   Klebsiella oxytoca NOT DETECTED NOT DETECTED Final   Klebsiella pneumoniae DETECTED (A) NOT DETECTED Final    Comment: CRITICAL RESULT CALLED TO, READ BACK BY AND VERIFIED WITH: DAVID BESANTI 11/26/19 AT 0255 HS    Proteus species NOT DETECTED NOT DETECTED Final   Salmonella species NOT DETECTED NOT DETECTED Final   Serratia marcescens NOT DETECTED NOT DETECTED Final   Haemophilus influenzae NOT DETECTED NOT DETECTED Final   Neisseria meningitidis NOT DETECTED NOT DETECTED Final   Pseudomonas aeruginosa NOT DETECTED  NOT DETECTED Final   Stenotrophomonas maltophilia NOT DETECTED NOT DETECTED Final   Candida albicans NOT DETECTED NOT DETECTED Final   Candida auris NOT DETECTED NOT DETECTED Final   Candida glabrata NOT DETECTED NOT DETECTED Final   Candida krusei NOT DETECTED NOT DETECTED Final   Candida parapsilosis NOT DETECTED NOT DETECTED Final   Candida tropicalis NOT DETECTED NOT DETECTED Final   Cryptococcus neoformans/gattii NOT DETECTED NOT DETECTED Final   CTX-M ESBL NOT DETECTED NOT DETECTED Final   Carbapenem resistance IMP NOT DETECTED NOT DETECTED Final   Carbapenem resistance KPC NOT DETECTED NOT DETECTED Final   Carbapenem resistance NDM NOT DETECTED NOT DETECTED Final   Carbapenem resist OXA 48 LIKE NOT DETECTED NOT DETECTED Final   Carbapenem resistance VIM NOT DETECTED NOT DETECTED Final    Comment: Performed at [facility], [address] [place], [place] [address]  SARS Coronavirus 2 by RT PCR (hospital order, performed in [facility] hospital lab) Nasopharyngeal Nasopharyngeal Swab     Status: None   Collection Time: 11/17/2019 12:39 PM   Specimen: Nasopharyngeal Swab  Result Value Ref Range Status   SARS Coronavirus 2 NEGATIVE NEGATIVE Final    Comment: (NOTE) SARS-CoV-2 target nucleic acids are NOT DETECTED.  The SARS-CoV-2 RNA is generally detectable in upper and lower respiratory specimens during the acute phase of infection. The lowest concentration of SARS-CoV-2 viral copies this assay can detect is 250 copies / mL. A negative result does not preclude SARS-CoV-2 infection and should not be used as the sole basis for treatment or other patient management decisions.  A negative result may occur with improper specimen collection / handling, submission of specimen other than nasopharyngeal swab, presence of viral mutation(s) within the areas targeted by this assay, and inadequate number of viral copies (<250 copies / mL). A negative result must be combined with  clinical observations, patient history, and epidemiological information.  Fact Sheet for Patients:   BoilerBrush.com.cy  Fact Sheet for Healthcare Providers: https://pope.com/  This test is not yet approved or  cleared by the [place] FDA and has been authorized for detection and/or diagnosis of SARS-CoV-2 by FDA under an Emergency Use Authorization (EUA).  This EUA will remain in effect (meaning this test can be used) for the duration of the COVID-19 declaration under Section 564(b)(1) of the Act, 21 U.S.C. section 360bbb-3(b)(1), unless the authorization is terminated or revoked sooner.  Performed at [facility], [address] [place], [place] [address]   C Difficile Quick Screen w PCR reflex     Status: None   Collection Time: 11/27/19  6:18 AM   Specimen: STOOL  Result Value Ref Range Status   C Diff antigen NEGATIVE NEGATIVE Final   C Diff toxin NEGATIVE NEGATIVE Final   C Diff interpretation No C. difficile  detected.  Final    Comment: Performed at [facility], [address] [place], [place] [address]  MRSA PCR Screening     Status: None   Collection Time: 11/27/19 11:30 PM   Specimen: Nasal Mucosa; Nasopharyngeal  Result Value Ref Range Status   MRSA by PCR NEGATIVE NEGATIVE Final    Comment:        The GeneXpert MRSA Assay (FDA approved for NASAL specimens only), is one component of a comprehensive MRSA colonization surveillance program. It is not intended to diagnose MRSA infection nor to guide or monitor treatment for MRSA infections. Performed at [facility], [address] [place], [place] [address]   CULTURE, BLOOD (ROUTINE X 2) w Reflex to ID Panel     Status: None (Preliminary result)   Collection Time: 11/28/19 11:27 AM   Specimen: BLOOD  Result Value Ref Range Status   Specimen Description BLOOD LEFT ARM  Final   Special Requests   Final    BOTTLES DRAWN  AEROBIC AND ANAEROBIC Blood Culture adequate volume   Culture   Final    NO GROWTH 4 DAYS Performed at [facility], [address] [place], [place] [address]    Report Status PENDING  Incomplete  CULTURE, BLOOD (ROUTINE X 2) w Reflex to ID Panel     Status: None (Preliminary result)   Collection Time: 11/28/19 11:49 AM   Specimen: BLOOD  Result Value Ref Range Status   Specimen Description BLOOD RIGHT ARM  Final   Special Requests   Final    BOTTLES DRAWN AEROBIC AND ANAEROBIC Blood Culture adequate volume   Culture   Final    NO GROWTH 4 DAYS Performed at [facility], [address] [place], [place] [address]    Report Status PENDING  Incomplete     Studies: DG Chest Port 1 View  Result Date: 12/02/2019 CLINICAL DATA:  Short of breath, sepsis, diastolic heart failure EXAM: PORTABLE CHEST 1 VIEW COMPARISON:  11/26/2019 FINDINGS: Single frontal view of the chest demonstrates persistent enlargement the cardiac silhouette. Stable veiling opacity at the right lung base, with continued bilateral vascular congestion. No pneumothorax. No acute bony abnormalities. IMPRESSION: 1. Stable right basilar consolidation and effusion. 2. Stable vascular congestion. Electronically Signed   By: Sharlet Salina M.D.   On: 12/02/2019 15:16    Scheduled Meds: . ARIPiprazole  2 mg Oral Daily  . aspirin EC  81 mg Oral Daily  . baclofen  5 mg Oral TID  . Chlorhexidine Gluconate Cloth  6 each Topical Daily  . enoxaparin (LOVENOX) injection  40 mg Subcutaneous Q24H  . famotidine  20 mg Oral Daily  . furosemide  40 mg Oral Daily  . levothyroxine  150 mcg Oral QAC breakfast  . midodrine  10 mg Oral TID WC  . multivitamin-lutein  1 capsule Oral Daily  . pregabalin  100 mg Oral TID  . sodium hypochlorite  1 application Irrigation BID  . traZODone  50 mg Oral QHS  . venlafaxine XR  225 mg Oral Daily  . vitamin B-12  1,000 mcg Oral Daily  . zinc oxide   Topical BID   Continuous  Infusions: . cefTRIAXone (ROCEPHIN)  IV 2 g (12/02/19 0432)    Assessment/Plan:  1. Hypotension.  Continue midodrine at increased dose 10 mg 3 times daily. 2. Septic shock secondary to Klebsiella continue Rocephin while here and switch over to Keflex upon discharge home.  Foley catheter changed on this  hospital stay and urine culture likely contamination. 3. Acute diastolic congestive heart failure.  Chest x-ray showing some fluid.  Started p.o. Lasix yesterday. 4. 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  Continue dressing changes and wound care 5. Acute encephalopathy secondary to sepsis.  When I see the patient really early she is more lethargic than in the late morning. 6. Dysphagia.  Patient on dysphagia 1 diet with nectar thick liquids 7. Severe protein calorie nutrition 8. Hypothyroidism unspecified on levothyroxine  Pressure Injury 09/29/19 Leg Left;Posterior;Lower Stage 2 -  Partial thickness loss of dermis presenting as a shallow open injury with a red, pink wound bed without slough. three small, round stage two pressure ulcers. (Active)  09/29/19 1445  Location: Leg  Location Orientation: Left;Posterior;Lower  Staging: Stage 2 -  Partial thickness loss of dermis presenting as a shallow open injury with a red, pink wound bed without slough.  Wound Description (Comments): three small, round stage two pressure ulcers.  Present on Admission: Yes     Pressure Injury 11/27/19 Sacrum Stage 4 - Full thickness tissue loss with exposed bone, tendon or muscle. (Active)  11/27/19 2100  Location: Sacrum  Location Orientation:   Staging: Stage 4 - Full thickness tissue loss with exposed bone, tendon or muscle.  Wound Description (Comments):   Present on Admission: Yes     Pressure Injury 11/28/19 Buttocks Upper Stage 4 - Full thickness tissue loss with exposed bone, tendon or muscle. (Active)  11/28/19   Location: Buttocks  Location Orientation: Upper  Staging: Stage 4 - Full thickness  tissue loss with exposed bone, tendon or muscle.  Wound Description (Comments):   Present on Admission: Yes       Code Status:     Code Status Orders  (From admission, onward)         Start     Ordered   11/18/2019 1459  Full code  Continuous        11/15/2019 1459        Code Status History    Date Active Date Inactive Code Status Order ID Comments User Context   09/29/2019 1309 10/05/2019 2114 Full Code [id number]  Salena Saner, MD ED   01/15/2017 2127 02/11/2017 1733 Full Code [id number]  Houston Siren, MD ED   01/01/2017 2006 01/03/2017 1805 Full Code [id number]  Shaune Pollack, MD ED   12/19/2016 0135 12/20/2016 1557 Full Code [id number]  Oralia Manis, MD Inpatient   09/12/2016 0513 09/13/2016 1812 Full Code [id number]  Arnaldo Natal, MD Inpatient   08/14/2016 1930 08/15/2016 1942 Full Code [id number]  Alford Highland, MD ED   07/06/2016 0749 07/07/2016 2105 Full Code [id number]  Arnaldo Natal, MD Inpatient   Advance Care Planning Activity    Advance Directive Documentation     Most Recent Value  Type of Advance Directive Healthcare Power of Attorney  Pre-existing out of facility DNR order (yellow form or pink MOST form) --  "MOST" Form in Place? --     Family Communication: Son and husband at the bedside Disposition Plan: Status is: Inpatient  Dispo:  Patient From: Home  Planned Disposition: Home with Health Care Svc  Expected discharge date: Every day am hopeful that I will be able to send her home.  Patient's blood pressure too low today in order to go home.  Antibiotics:  Rocephin  Time spent: 28 minutes  Ardeth [employer]

## 2019-12-02 NOTE — Progress Notes (Signed)
Pt very hard to arouse. RN sternal rub pt and checked VS. VS stable but pt took a few minutes of stimulation to keep eyes open and have some communication. Pt's lung sounds are more diminished and some crackles hear. Pt has more edema than the day before, +3 in arms and legs. RN notified Attending on pt's situation.

## 2019-12-03 LAB — GLUCOSE, CAPILLARY
Glucose-Capillary: 61 mg/dL — ABNORMAL LOW (ref 70–99)
Glucose-Capillary: 72 mg/dL (ref 70–99)
Glucose-Capillary: 89 mg/dL (ref 70–99)
Glucose-Capillary: 94 mg/dL (ref 70–99)
Glucose-Capillary: 97 mg/dL (ref 70–99)

## 2019-12-03 LAB — CULTURE, BLOOD (ROUTINE X 2)
Culture: NO GROWTH
Culture: NO GROWTH
Special Requests: ADEQUATE
Special Requests: ADEQUATE

## 2019-12-03 LAB — CREATININE, SERUM
Creatinine, Ser: 0.56 mg/dL (ref 0.44–1.00)
GFR calc Af Amer: >60 mL/min (ref >60)
GFR calc non Af Amer: >60 mL/min (ref >60)

## 2019-12-03 MED ORDER — SODIUM CHLORIDE 0.9 % IV SOLN
INTRAVENOUS | Status: DC | PRN
  Administered 2019-12-03 – 2019-12-04 (×2): 250 mL via INTRAVENOUS

## 2019-12-03 MED ORDER — FUROSEMIDE 10 MG/ML IJ SOLN
20.0000 mg | Freq: Two times a day (BID) | INTRAMUSCULAR | Status: DC
  Administered 2019-12-03 (×2): 20 mg via INTRAVENOUS
  Filled 2019-12-03 (×2): qty 4

## 2019-12-03 MED ORDER — POTASSIUM CHLORIDE 20 MEQ PO PACK
10.0000 meq | PACK | Freq: Two times a day (BID) | ORAL | Status: DC
  Administered 2019-12-03 – 2019-12-04 (×3): 10 meq via ORAL
  Filled 2019-12-03 (×4): qty 1

## 2019-12-03 MED ORDER — PREGABALIN 25 MG PO CAPS
25.0000 mg | ORAL_CAPSULE | Freq: Two times a day (BID) | ORAL | Status: DC
  Administered 2019-12-03 (×2): 25 mg via ORAL
  Filled 2019-12-03 (×2): qty 1

## 2019-12-03 NOTE — Consult Note (Addendum)
WOC Nurse Consult Note: In accordance with Surgery's input from their consult on 8/18, we will continue the Dakin's solution to the wound for an additional 3 days.  It is likely that this will continue if patient stays in house longer and post discharge as that is what the POC from the [facility] has been-with family performing wound care inbetween visits to that provider. Recommendation from Surgery (Dr. Tonna Boehringer) is to resume follow up at [facility] post discharge and see Surgery only as needed for possible surgical debridement. See his note from 11/29/19.  WOC nursing team will not follow routinely, but will remain available to this patient, the nursing and medical teams and see patient every 7-10 days.  Please re-consult if needed in between visits. Thanks, Ladona Mow, MSN, RN, GNP, [place]  Pager# [phone]

## 2019-12-03 NOTE — Progress Notes (Signed)
Patient ID: Jessica [name], female   DOB: February 19, 1946, 74 y.o.   MRN: [id number] [facility] PROGRESS NOTE  Jessica [name] PJK:[id number] DOB: 1945/11/11 DOA: 12/08/2019 PCP: Annita Brod, MD  HPI/Subjective: Patient more alert today.  She never offers any complaints.  Answers no to all my questions.  Admitted on 12/05/2019 with acute respiratory failure requiring intubation.  Yesterday patient was talking out of her head in the afternoon.  Objective: Vitals:   12/02/19 2317 12/03/19 0813  BP: (!) 94/58 104/63  Pulse: 75 98  Resp: 16 16  Temp: 98.4 F (36.9 C) 97.8 F (36.6 C)  SpO2: 97% 98%    Intake/Output Summary (Last 24 hours) at 12/03/2019 1520 Last data filed at 12/03/2019 1409 Gross per 24 hour  Intake 60.83 ml  Output 1750 ml  Net -1689.17 ml   Filed Weights   11/21/2019 1207 11/28/19 0600 12/03/19 0629  Weight: 97.3 kg 99.3 kg 130.6 kg    ROS: Review of Systems  Respiratory: Negative for shortness of breath.   Cardiovascular: Negative for chest pain.  Gastrointestinal: Negative for abdominal pain.   Exam: Physical Exam HENT:     Mouth/Throat:     Pharynx: No oropharyngeal exudate.  Eyes:     General: Lids are normal.     Conjunctiva/sclera: Conjunctivae normal.     Pupils: Pupils are equal, round, and reactive to light.  Cardiovascular:     Rate and Rhythm: Normal rate and regular rhythm.     Heart sounds: Normal heart sounds, S1 normal and S2 normal.  Pulmonary:     Breath sounds: Examination of the right-lower field reveals decreased breath sounds and rhonchi. Examination of the left-lower field reveals decreased breath sounds and rhonchi. Decreased breath sounds and rhonchi present. No wheezing or rales.  Abdominal:     Palpations: Abdomen is soft.     Tenderness: There is no abdominal tenderness.  Skin:    General: Skin is warm.     Findings: No rash.     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     Comments: Answers yes/no questions appropriately.       Data Reviewed: Basic Metabolic Panel: Recent Labs  Lab 11/27/19 0550 11/27/19 0550 11/28/19 0412 11/28/19 0412 11/29/19 0418 11/29/19 0811 11/30/19 0344 12/01/19 0534 12/02/19 1446 12/03/19 0516  NA 138   < > 139  --  140  --  143 142 141  --   K 3.0*   < > 3.7  --  4.0  --  4.2 3.9 3.6  --   CL 101   < > 106  --  109  --  111 109 106  --   CO2 24   < > 25  --  27  --  26 26 27   --   GLUCOSE 68*   < > 116*  --  93  --  99 77 100*  --   BUN 7*   < > <5*  --  <5*  --  <5* <5* 6*  --   CREATININE 0.59   < > 0.52   < > 0.35*  --  0.49 0.49 0.55 0.56  CALCIUM 7.9*   < > 8.0*  --  7.9*  --  8.0* 8.0* 8.0*  --   MG 1.7  --  2.0  --   --  1.9  --   --   --   --  PHOS 2.7  --  2.2*  --   --  2.4*  --   --   --   --    < > = values in this interval not displayed.   Liver Function Tests: Recent Labs  Lab 11/27/19 0550 11/28/19 0412 11/29/19 0418 11/30/19 0344 12/01/19 0534  AST 61* 84* 104* 86* 66*  ALT 30 35 41 41 38  ALKPHOS 126 128* 126 128* 119  BILITOT 2.3* 2.1* 2.1* 2.7* 2.0*  PROT 5.2* 5.1* 4.7* 5.0* 4.9*  ALBUMIN 2.5* 2.6* 2.2* 2.2* 2.1*   CBC: Recent Labs  Lab 11/27/19 0550 11/27/19 0550 11/28/19 0412 11/29/19 0418 11/30/19 0344 12/01/19 0534 12/02/19 1446  WBC 12.9*   < > 9.9 11.3* 12.2* 10.1 15.1*  NEUTROABS 9.7*  --  7.0 7.0 7.4 5.7  --   HGB 8.6*   < > 8.6* 9.0* 9.5* 10.2* 9.8*  HCT 25.0*   < > 24.6* 27.5* 29.3* 31.2* 29.2*  MCV 90.9   < > 90.1 92.9 94.2 93.4 91.3  PLT 188   < > 169 180 200 191 268   < > = values in this interval not displayed.    CBG: Recent Labs  Lab 12/02/19 1633 12/02/19 2120 12/03/19 0812 12/03/19 0939 12/03/19 1216  GLUCAP 73 84 61* 72 89    Recent Results (from the past 240 hour(s))  Culture, blood (Routine x 2)     Status: Abnormal   Collection Time: 12/10/2019 10:21 AM   Specimen: BLOOD  Result Value Ref Range Status   Specimen  Description   Final    BLOOD RIGHT ANTECUBITAL Performed at [facility], [address], [place], [place] [address]    Special Requests   Final    BOTTLES DRAWN AEROBIC AND ANAEROBIC Blood Culture results may not be optimal due to an excessive volume of blood received in culture bottles Performed at [facility], [address] [place], [place] [address]    Culture  Setup Time   Final    Organism ID to follow GRAM NEGATIVE RODS IN BOTH AEROBIC AND ANAEROBIC BOTTLES CRITICAL RESULT CALLED TO, READ BACK BY AND VERIFIED WITH: DAVID BESANTI 11/26/19 AT 0255 HS Performed at [facility], [address] [place], [place] [address]    Culture KLEBSIELLA PNEUMONIAE (A)  Final   Report Status 11/28/2019 FINAL  Final   Organism ID, Bacteria KLEBSIELLA PNEUMONIAE  Final      Susceptibility   Klebsiella pneumoniae - MIC*    AMPICILLIN RESISTANT Resistant     CEFAZOLIN <=4 SENSITIVE Sensitive     CEFEPIME <=0.12 SENSITIVE Sensitive     CEFTAZIDIME <=1 SENSITIVE Sensitive     CEFTRIAXONE <=0.25 SENSITIVE Sensitive     CIPROFLOXACIN <=0.25 SENSITIVE Sensitive     GENTAMICIN <=1 SENSITIVE Sensitive     IMIPENEM <=0.25 SENSITIVE Sensitive     TRIMETH/SULFA <=20 SENSITIVE Sensitive     AMPICILLIN/SULBACTAM <=2 SENSITIVE Sensitive     PIP/TAZO <=4 SENSITIVE Sensitive     * KLEBSIELLA PNEUMONIAE  Culture, blood (Routine x 2)     Status: Abnormal   Collection Time: 12/06/2019 10:21 AM   Specimen: BLOOD  Result Value Ref Range Status   Specimen Description   Final    BLOOD RH Performed at [facility], [address] [place], [place] [address]    Special Requests   Final    BOTTLES DRAWN AEROBIC AND ANAEROBIC Blood Culture results may not be optimal  due to an excessive volume of blood received in culture bottles Performed at [facility], [address] [place], [place] [address]    Culture  Setup Time   Final    GRAM NEGATIVE RODS AEROBIC BOTTLE  ONLY CRITICAL VALUE NOTED.  VALUE IS CONSISTENT WITH PREVIOUSLY REPORTED AND CALLED VALUE. Performed at [facility], [address] [place], [place] [address]    Culture (A)  Final    KLEBSIELLA PNEUMONIAE SUSCEPTIBILITIES PERFORMED ON PREVIOUS CULTURE WITHIN THE LAST 5 DAYS. Performed at [facility], [address], [place], [place] [address]    Report Status 11/28/2019 FINAL  Final  Urine culture     Status: Abnormal   Collection Time: 11/14/2019 10:21 AM   Specimen: In/Out Cath Urine  Result Value Ref Range Status   Specimen Description   Final    IN/OUT CATH URINE Performed at [facility], [address] [place], [place] [address]    Special Requests   Final    NONE Performed at [facility], [address] [place], [place] [address]    Culture (A)  Final    >=100,000 COLONIES/mL PROTEUS MIRABILIS 30,000 COLONIES/mL CITROBACTER FREUNDII    Report Status 11/29/2019 FINAL  Final   Organism ID, Bacteria PROTEUS MIRABILIS (A)  Final   Organism ID, Bacteria CITROBACTER FREUNDII (A)  Final      Susceptibility   Citrobacter freundii - MIC*    CEFAZOLIN >=64 RESISTANT Resistant     CEFTRIAXONE <=0.25 SENSITIVE Sensitive     CIPROFLOXACIN <=0.25 SENSITIVE Sensitive     GENTAMICIN <=1 SENSITIVE Sensitive     IMIPENEM 0.5 SENSITIVE Sensitive     NITROFURANTOIN <=16 SENSITIVE Sensitive     TRIMETH/SULFA <=20 SENSITIVE Sensitive     PIP/TAZO <=4 SENSITIVE Sensitive     * 30,000 COLONIES/mL CITROBACTER FREUNDII   Proteus mirabilis - MIC*    AMPICILLIN >=32 RESISTANT Resistant     CEFAZOLIN >=64 RESISTANT Resistant     CEFTRIAXONE 32 RESISTANT Resistant     CIPROFLOXACIN >=4 RESISTANT Resistant     GENTAMICIN >=16 RESISTANT Resistant     IMIPENEM 2 SENSITIVE Sensitive     NITROFURANTOIN 128 RESISTANT Resistant     TRIMETH/SULFA >=320 RESISTANT Resistant     AMPICILLIN/SULBACTAM >=32 RESISTANT Resistant     PIP/TAZO <=4 SENSITIVE Sensitive      * >=100,000 COLONIES/mL PROTEUS MIRABILIS  Blood Culture ID Panel (Reflexed)     Status: Abnormal   Collection Time: Dec 09, 2019 10:21 AM  Result Value Ref Range Status   Enterococcus faecalis NOT DETECTED NOT DETECTED Final   Enterococcus Faecium NOT DETECTED NOT DETECTED Final   Listeria monocytogenes NOT DETECTED NOT DETECTED Final   Staphylococcus species NOT DETECTED NOT DETECTED Final   Staphylococcus aureus (BCID) NOT DETECTED NOT DETECTED Final   Staphylococcus epidermidis NOT DETECTED NOT DETECTED Final   Staphylococcus lugdunensis NOT DETECTED NOT DETECTED Final   Streptococcus species NOT DETECTED NOT DETECTED Final   Streptococcus agalactiae NOT DETECTED NOT DETECTED Final   Streptococcus pneumoniae NOT DETECTED NOT DETECTED Final   Streptococcus pyogenes NOT DETECTED NOT DETECTED Final   A.calcoaceticus-baumannii NOT DETECTED NOT DETECTED Final   Bacteroides fragilis NOT DETECTED NOT DETECTED Final   Enterobacterales DETECTED (A) NOT DETECTED Final    Comment: Enterobacterales represent a large order of gram negative bacteria, not a single organism. CRITICAL RESULT CALLED TO, READ BACK BY AND VERIFIED WITH: DAVID BESANTI 11/26/19 AT 0255 HS  Enterobacter cloacae complex NOT DETECTED NOT DETECTED Final   Escherichia coli NOT DETECTED NOT DETECTED Final   Klebsiella aerogenes NOT DETECTED NOT DETECTED Final   Klebsiella oxytoca NOT DETECTED NOT DETECTED Final   Klebsiella pneumoniae DETECTED (A) NOT DETECTED Final    Comment: CRITICAL RESULT CALLED TO, READ BACK BY AND VERIFIED WITH: DAVID BESANTI 11/26/19 AT 0255 HS    Proteus species NOT DETECTED NOT DETECTED Final   Salmonella species NOT DETECTED NOT DETECTED Final   Serratia marcescens NOT DETECTED NOT DETECTED Final   Haemophilus influenzae NOT DETECTED NOT DETECTED Final   Neisseria meningitidis NOT DETECTED NOT DETECTED Final   Pseudomonas aeruginosa NOT DETECTED NOT DETECTED Final   Stenotrophomonas  maltophilia NOT DETECTED NOT DETECTED Final   Candida albicans NOT DETECTED NOT DETECTED Final   Candida auris NOT DETECTED NOT DETECTED Final   Candida glabrata NOT DETECTED NOT DETECTED Final   Candida krusei NOT DETECTED NOT DETECTED Final   Candida parapsilosis NOT DETECTED NOT DETECTED Final   Candida tropicalis NOT DETECTED NOT DETECTED Final   Cryptococcus neoformans/gattii NOT DETECTED NOT DETECTED Final   CTX-M ESBL NOT DETECTED NOT DETECTED Final   Carbapenem resistance IMP NOT DETECTED NOT DETECTED Final   Carbapenem resistance KPC NOT DETECTED NOT DETECTED Final   Carbapenem resistance NDM NOT DETECTED NOT DETECTED Final   Carbapenem resist OXA 48 LIKE NOT DETECTED NOT DETECTED Final   Carbapenem resistance VIM NOT DETECTED NOT DETECTED Final    Comment: Performed at [facility], [address] [place], [place] [address]  SARS Coronavirus 2 by RT PCR (hospital order, performed in [facility] hospital lab) Nasopharyngeal Nasopharyngeal Swab     Status: None   Collection Time: 12/07/2019 12:39 PM   Specimen: Nasopharyngeal Swab  Result Value Ref Range Status   SARS Coronavirus 2 NEGATIVE NEGATIVE Final    Comment: (NOTE) SARS-CoV-2 target nucleic acids are NOT DETECTED.  The SARS-CoV-2 RNA is generally detectable in upper and lower respiratory specimens during the acute phase of infection. The lowest concentration of SARS-CoV-2 viral copies this assay can detect is 250 copies / mL. A negative result does not preclude SARS-CoV-2 infection and should not be used as the sole basis for treatment or other patient management decisions.  A negative result may occur with improper specimen collection / handling, submission of specimen other than nasopharyngeal swab, presence of viral mutation(s) within the areas targeted by this assay, and inadequate number of viral copies (<250 copies / mL). A negative result must be combined with clinical observations, patient history,  and epidemiological information.  Fact Sheet for Patients:   BoilerBrush.com.cy  Fact Sheet for Healthcare Providers: https://pope.com/  This test is not yet approved or  cleared by the [place] FDA and has been authorized for detection and/or diagnosis of SARS-CoV-2 by FDA under an Emergency Use Authorization (EUA).  This EUA will remain in effect (meaning this test can be used) for the duration of the COVID-19 declaration under Section 564(b)(1) of the Act, 21 U.S.C. section 360bbb-3(b)(1), unless the authorization is terminated or revoked sooner.  Performed at [facility], [address] [place], [place] [address]   C Difficile Quick Screen w PCR reflex     Status: None   Collection Time: 11/27/19  6:18 AM   Specimen: STOOL  Result Value Ref Range Status   C Diff antigen NEGATIVE NEGATIVE Final   C Diff toxin NEGATIVE NEGATIVE Final   C Diff interpretation No C.  difficile detected.  Final    Comment: Performed at [facility], [address] [place], [place] [address]  MRSA PCR Screening     Status: None   Collection Time: 11/27/19 11:30 PM   Specimen: Nasal Mucosa; Nasopharyngeal  Result Value Ref Range Status   MRSA by PCR NEGATIVE NEGATIVE Final    Comment:        The GeneXpert MRSA Assay (FDA approved for NASAL specimens only), is one component of a comprehensive MRSA colonization surveillance program. It is not intended to diagnose MRSA infection nor to guide or monitor treatment for MRSA infections. Performed at [facility], [address] [place], [place] [address]   CULTURE, BLOOD (ROUTINE X 2) w Reflex to ID Panel     Status: None   Collection Time: 11/28/19 11:27 AM   Specimen: BLOOD  Result Value Ref Range Status   Specimen Description BLOOD LEFT ARM  Final   Special Requests   Final    BOTTLES DRAWN AEROBIC AND ANAEROBIC Blood Culture adequate volume   Culture    Final    NO GROWTH 5 DAYS Performed at [facility], [address] [place], [place] [address]    Report Status 12/03/2019 FINAL  Final  CULTURE, BLOOD (ROUTINE X 2) w Reflex to ID Panel     Status: None   Collection Time: 11/28/19 11:49 AM   Specimen: BLOOD  Result Value Ref Range Status   Specimen Description BLOOD RIGHT ARM  Final   Special Requests   Final    BOTTLES DRAWN AEROBIC AND ANAEROBIC Blood Culture adequate volume   Culture   Final    NO GROWTH 5 DAYS Performed at [facility], [address] [place], [place] [address]    Report Status 12/03/2019 FINAL  Final     Studies: DG Chest Port 1 View  Result Date: 12/02/2019 CLINICAL DATA:  Short of breath, sepsis, diastolic heart failure EXAM: PORTABLE CHEST 1 VIEW COMPARISON:  11/26/2019 FINDINGS: Single frontal view of the chest demonstrates persistent enlargement the cardiac silhouette. Stable veiling opacity at the right lung base, with continued bilateral vascular congestion. No pneumothorax. No acute bony abnormalities. IMPRESSION: 1. Stable right basilar consolidation and effusion. 2. Stable vascular congestion. Electronically Signed   By: Sharlet Salina M.D.   On: 12/02/2019 15:16    Scheduled Meds: . ARIPiprazole  2 mg Oral Daily  . aspirin EC  81 mg Oral Daily  . baclofen  5 mg Oral TID  . Chlorhexidine Gluconate Cloth  6 each Topical Daily  . enoxaparin (LOVENOX) injection  40 mg Subcutaneous Q24H  . famotidine  20 mg Oral Daily  . furosemide  20 mg Intravenous BID  . levothyroxine  150 mcg Oral QAC breakfast  . midodrine  10 mg Oral TID WC  . multivitamin-lutein  1 capsule Oral Daily  . potassium chloride  10 mEq Oral BID  . pregabalin  25 mg Oral BID  . sodium hypochlorite  1 application Irrigation BID  . traZODone  50 mg Oral QHS  . venlafaxine XR  225 mg Oral Daily  . vitamin B-12  1,000 mcg Oral Daily  . zinc oxide   Topical BID   Continuous Infusions: . sodium chloride 250 mL  (12/03/19 0415)  . cefTRIAXone (ROCEPHIN)  IV 2 g (12/03/19 0420)    Assessment/Plan:  1. Acute diastolic congestive heart failure.  Repeat chest x-ray showing some fluid.  Will change to IV Lasix twice daily  low-dose while here. 2. Acute hypoxic respiratory failure.  Pulse ox 88% on room air.  Continue oxygen supplementation 3. Hypotension.  Continue midodrine 3 times a day 10 mg. 4. Septic shock secondary to Klebsiella on Rocephin here.  Foley catheter changed his hospital stay and urine culture likely contamination.  5. 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  Continue local wound care. 6. Acute metabolic encephalopathy likely in hospital delirium yesterday afternoon.  Mental status this morning was better than it has been.  Continue to monitor.  Held Lyrica yesterday. 7. Small right pleural effusion.  If mental status worsens can potentially look to do a thoracentesis but will reevaluate this tomorrow. 8. Dysphagia.  Patient on dysphagia 1 diet with nectar thick liquids.  High risk for aspiration. 9. Severe protein calorie malnutrition. 10. Hypothyroidism unspecified on levothyroxine 11. Restart low-dose Lyrica today.  Pressure Injury 09/29/19 Leg Left;Posterior;Lower Stage 2 -  Partial thickness loss of dermis presenting as a shallow open injury with a red, pink wound bed without slough. three small, round stage two pressure ulcers. (Active)  09/29/19 1445  Location: Leg  Location Orientation: Left;Posterior;Lower  Staging: Stage 2 -  Partial thickness loss of dermis presenting as a shallow open injury with a red, pink wound bed without slough.  Wound Description (Comments): three small, round stage two pressure ulcers.  Present on Admission: Yes     Pressure Injury 11/27/19 Sacrum Stage 4 - Full thickness tissue loss with exposed bone, tendon or muscle. (Active)  11/27/19 2100  Location: Sacrum  Location Orientation:   Staging: Stage 4 - Full thickness tissue loss with exposed bone,  tendon or muscle.  Wound Description (Comments):   Present on Admission: Yes     Pressure Injury 11/28/19 Buttocks Upper Stage 4 - Full thickness tissue loss with exposed bone, tendon or muscle. (Active)  11/28/19   Location: Buttocks  Location Orientation: Upper  Staging: Stage 4 - Full thickness tissue loss with exposed bone, tendon or muscle.  Wound Description (Comments):   Present on Admission: Yes       Code Status:     Code Status Orders  (From admission, onward)         Start     Ordered   12/02/2019 1459  Full code  Continuous        12/01/2019 1459        Code Status History    Date Active Date Inactive Code Status Order ID Comments User Context   09/29/2019 1309 10/05/2019 2114 Full Code [id number]  Salena Saner, MD ED   01/15/2017 2127 02/11/2017 1733 Full Code [id number]  Houston Siren, MD ED   01/01/2017 2006 01/03/2017 1805 Full Code [id number]  Shaune Pollack, MD ED   12/19/2016 0135 12/20/2016 1557 Full Code [id number]  Oralia Manis, MD Inpatient   09/12/2016 0513 09/13/2016 1812 Full Code [id number]  Arnaldo Natal, MD Inpatient   08/14/2016 1930 08/15/2016 1942 Full Code [id number]  Alford Highland, MD ED   07/06/2016 0749 07/07/2016 2105 Full Code [id number]  Arnaldo Natal, MD Inpatient   Advance Care Planning Activity    Advance Directive Documentation     Most Recent Value  Type of Advance Directive Healthcare Power of Attorney  Pre-existing out of facility DNR order (yellow form or pink MOST form) --  "MOST" Form in Place? --     Family Communication: Spoke with son at the bedside Disposition Plan: Status is: Inpatient  Dispo:  Patient From: Home  Planned Disposition: Home  Expected discharge date: 12/04/19  Medically and treated for acute diastolic congestive heart failure with IV Lasix.  Also watching blood pressure on midodrine.  Antibiotics:  Rocephin  Time spent: 28 minutes  [employer]  [facility]

## 2019-12-04 DIAGNOSIS — R112 Nausea with vomiting, unspecified: Secondary | ICD-10-CM

## 2019-12-04 DIAGNOSIS — R531 Weakness: Secondary | ICD-10-CM

## 2019-12-04 LAB — CBC
HCT: 29.3 % — ABNORMAL LOW (ref 36.0–46.0)
Hemoglobin: 9.5 g/dL — ABNORMAL LOW (ref 12.0–15.0)
MCH: 30 pg (ref 26.0–34.0)
MCHC: 32.4 g/dL (ref 30.0–36.0)
MCV: 92.4 fL (ref 80.0–100.0)
Platelets: 257 10*3/uL (ref 150–400)
RBC: 3.17 MIL/uL — ABNORMAL LOW (ref 3.87–5.11)
RDW: 16.9 % — ABNORMAL HIGH (ref 11.5–15.5)
WBC: 13.9 10*3/uL — ABNORMAL HIGH (ref 4.0–10.5)
nRBC: 0 % (ref 0.0–0.2)

## 2019-12-04 LAB — BASIC METABOLIC PANEL
Anion gap: 8 (ref 5–15)
BUN: 7 mg/dL — ABNORMAL LOW (ref 8–23)
CO2: 30 mmol/L (ref 22–32)
Calcium: 7.8 mg/dL — ABNORMAL LOW (ref 8.9–10.3)
Chloride: 105 mmol/L (ref 98–111)
Creatinine, Ser: 0.61 mg/dL (ref 0.44–1.00)
GFR calc Af Amer: >60 mL/min (ref >60)
GFR calc non Af Amer: >60 mL/min (ref >60)
Glucose, Bld: 92 mg/dL (ref 70–99)
Potassium: 3.3 mmol/L — ABNORMAL LOW (ref 3.5–5.1)
Sodium: 143 mmol/L (ref 135–145)

## 2019-12-04 LAB — GLUCOSE, CAPILLARY
Glucose-Capillary: 92 mg/dL (ref 70–99)
Glucose-Capillary: 94 mg/dL (ref 70–99)
Glucose-Capillary: 93 mg/dL (ref 70–99)
Glucose-Capillary: 95 mg/dL (ref 70–99)

## 2019-12-04 MED ORDER — FUROSEMIDE 10 MG/ML IJ SOLN
20.0000 mg | Freq: Two times a day (BID) | INTRAMUSCULAR | Status: DC
  Administered 2019-12-04 (×2): 20 mg via INTRAVENOUS
  Filled 2019-12-04 (×2): qty 4

## 2019-12-04 MED ORDER — IPRATROPIUM-ALBUTEROL 0.5-2.5 (3) MG/3ML IN SOLN
3.0000 mL | Freq: Four times a day (QID) | RESPIRATORY_TRACT | Status: DC
  Administered 2019-12-04 – 2019-12-05 (×3): 3 mL via RESPIRATORY_TRACT
  Filled 2019-12-04 (×3): qty 3

## 2019-12-04 MED ORDER — FUROSEMIDE 20 MG PO TABS
20.0000 mg | ORAL_TABLET | Freq: Every day | ORAL | Status: DC
  Administered 2019-12-04: 20 mg via ORAL
  Filled 2019-12-04: qty 1

## 2019-12-04 MED ORDER — ONDANSETRON HCL 4 MG/2ML IJ SOLN
4.0000 mg | Freq: Four times a day (QID) | INTRAMUSCULAR | Status: DC | PRN
  Administered 2019-12-04: 4 mg via INTRAVENOUS
  Filled 2019-12-04: qty 2

## 2019-12-04 MED ORDER — PREGABALIN 50 MG PO CAPS
100.0000 mg | ORAL_CAPSULE | Freq: Two times a day (BID) | ORAL | Status: DC
  Administered 2019-12-04 (×2): 100 mg via ORAL
  Filled 2019-12-04 (×3): qty 2

## 2019-12-04 MED ORDER — ACETYLCYSTEINE 20 % IN SOLN
3.0000 mL | Freq: Two times a day (BID) | RESPIRATORY_TRACT | Status: DC
  Administered 2019-12-04 – 2019-12-05 (×2): 3 mL via RESPIRATORY_TRACT
  Filled 2019-12-04 (×4): qty 4

## 2019-12-04 MED ORDER — CEPHALEXIN 500 MG PO CAPS
500.0000 mg | ORAL_CAPSULE | Freq: Four times a day (QID) | ORAL | Status: DC
  Administered 2019-12-05: 500 mg via ORAL
  Filled 2019-12-04: qty 1

## 2019-12-04 NOTE — Progress Notes (Signed)
Patient ID: Jessica [name], female   DOB: 14-Mar-1946, 74 y.o.   MRN: [id number] [facility] PROGRESS NOTE  Jessica [name] RKY:[id number] DOB: 1945/09/03 DOA: 12/08/2019 PCP: Annita Brod, MD  HPI/Subjective: Patient complains of left arm pain this morning when I was in the room.  Otherwise felt okay this morning when I saw her.  Nurse was going to give her some pain medications in the morning medications and then vomited afterwards.  She vomited another time in the morning.  Objective: Vitals:   12/04/19 0839 12/04/19 1713  BP: (!) 111/52 107/63  Pulse: 84 (!) 115  Resp: 16 15  Temp: 98.4 F (36.9 C) 98.5 F (36.9 C)  SpO2: 96% 100%    Intake/Output Summary (Last 24 hours) at 12/04/2019 1736 Last data filed at 12/04/2019 0949 Gross per 24 hour  Intake 240 ml  Output 1650 ml  Net -1410 ml   Filed Weights   12/08/2019 1207 11/28/19 0600 12/03/19 0629  Weight: 97.3 kg 99.3 kg 130.6 kg    ROS: Review of Systems  Respiratory: Negative for shortness of breath.   Cardiovascular: Negative for chest pain.  Gastrointestinal: Positive for vomiting. Negative for abdominal pain.  Musculoskeletal: Positive for joint pain.   Exam: Physical Exam HENT:     Mouth/Throat:     Pharynx: No oropharyngeal exudate.  Eyes:     General: Lids are normal.     Conjunctiva/sclera: Conjunctivae normal.     Pupils: Pupils are equal, round, and reactive to light.  Cardiovascular:     Rate and Rhythm: Regular rhythm. Tachycardia present.     Heart sounds: Normal heart sounds, S1 normal and S2 normal.  Pulmonary:     Breath sounds: Examination of the right-lower field reveals decreased breath sounds. Examination of the left-lower field reveals decreased breath sounds. Decreased breath sounds present. No wheezing, rhonchi or rales.  Abdominal:     Palpations: Abdomen is soft.     Tenderness: There is no abdominal tenderness.  Musculoskeletal:     Right ankle: Swelling present.     Left  ankle: Swelling present.     Comments: Left upper arm painful and palpating the tricep.  I did massage a little bit which made it feel little bit better.  Skin:    General: Skin is warm.     Findings: No rash.  Neurological:     Mental Status: She is alert.       Data Reviewed: Basic Metabolic Panel: Recent Labs  Lab 11/28/19 0412 11/28/19 0412 11/29/19 0418 11/29/19 0418 11/29/19 0811 11/30/19 0344 12/01/19 0534 12/02/19 1446 12/03/19 0516 12/04/19 0407  NA 139   < > 140  --   --  143 142 141  --  143  K 3.7   < > 4.0  --   --  4.2 3.9 3.6  --  3.3*  CL 106   < > 109  --   --  111 109 106  --  105  CO2 25   < > 27  --   --  26 26 27   --  30  GLUCOSE 116*   < > 93  --   --  99 77 100*  --  92  BUN <5*   < > <5*  --   --  <5* <5* 6*  --  7*  CREATININE 0.52   < > 0.35*   < >  --  0.49 0.49 0.55 0.56 0.61  CALCIUM 8.0*   < >  7.9*  --   --  8.0* 8.0* 8.0*  --  7.8*  MG 2.0  --   --   --  1.9  --   --   --   --   --   PHOS 2.2*  --   --   --  2.4*  --   --   --   --   --    < > = values in this interval not displayed.   Liver Function Tests: Recent Labs  Lab 11/28/19 0412 11/29/19 0418 11/30/19 0344 12/01/19 0534  AST 84* 104* 86* 66*  ALT 35 41 41 38  ALKPHOS 128* 126 128* 119  BILITOT 2.1* 2.1* 2.7* 2.0*  PROT 5.1* 4.7* 5.0* 4.9*  ALBUMIN 2.6* 2.2* 2.2* 2.1*   No results for input(s): LIPASE, AMYLASE in the last 168 hours. No results for input(s): AMMONIA in the last 168 hours. CBC: Recent Labs  Lab 11/28/19 0412 11/28/19 0412 11/29/19 0418 11/30/19 0344 12/01/19 0534 12/02/19 1446 12/04/19 0407  WBC 9.9   < > 11.3* 12.2* 10.1 15.1* 13.9*  NEUTROABS 7.0  --  7.0 7.4 5.7  --   --   HGB 8.6*   < > 9.0* 9.5* 10.2* 9.8* 9.5*  HCT 24.6*   < > 27.5* 29.3* 31.2* 29.2* 29.3*  MCV 90.1   < > 92.9 94.2 93.4 91.3 92.4  PLT 169   < > 180 200 191 268 257   < > = values in this interval not displayed.    CBG: Recent Labs  Lab 12/03/19 1654 12/03/19 2117  12/04/19 0843 12/04/19 1237 12/04/19 1717  GLUCAP 94 97 92 94 93    Recent Results (from the past 240 hour(s))  Culture, blood (Routine x 2)     Status: Abnormal   Collection Time: 11/19/2019 10:21 AM   Specimen: BLOOD  Result Value Ref Range Status   Specimen Description   Final    BLOOD RIGHT ANTECUBITAL Performed at [facility], [address], [place], [place] [address]    Special Requests   Final    BOTTLES DRAWN AEROBIC AND ANAEROBIC Blood Culture results may not be optimal due to an excessive volume of blood received in culture bottles Performed at [facility], [address] [place], [place] [address]    Culture  Setup Time   Final    Organism ID to follow GRAM NEGATIVE RODS IN BOTH AEROBIC AND ANAEROBIC BOTTLES CRITICAL RESULT CALLED TO, READ BACK BY AND VERIFIED WITH: DAVID BESANTI 11/26/19 AT 0255 HS Performed at [facility], [address] [place], [place] [address]    Culture KLEBSIELLA PNEUMONIAE (A)  Final   Report Status 11/28/2019 FINAL  Final   Organism ID, Bacteria KLEBSIELLA PNEUMONIAE  Final      Susceptibility   Klebsiella pneumoniae - MIC*    AMPICILLIN RESISTANT Resistant     CEFAZOLIN <=4 SENSITIVE Sensitive     CEFEPIME <=0.12 SENSITIVE Sensitive     CEFTAZIDIME <=1 SENSITIVE Sensitive     CEFTRIAXONE <=0.25 SENSITIVE Sensitive     CIPROFLOXACIN <=0.25 SENSITIVE Sensitive     GENTAMICIN <=1 SENSITIVE Sensitive     IMIPENEM <=0.25 SENSITIVE Sensitive     TRIMETH/SULFA <=20 SENSITIVE Sensitive     AMPICILLIN/SULBACTAM <=2 SENSITIVE Sensitive     PIP/TAZO <=4 SENSITIVE Sensitive     * KLEBSIELLA PNEUMONIAE  Culture, blood (Routine x 2)     Status: Abnormal   Collection Time:  11/16/2019 10:21 AM   Specimen: BLOOD  Result Value Ref Range Status   Specimen Description   Final    BLOOD RH Performed at [facility], [address] [place], [place] [address]    Special Requests   Final    BOTTLES DRAWN AEROBIC  AND ANAEROBIC Blood Culture results may not be optimal due to an excessive volume of blood received in culture bottles Performed at [facility], [address], [place], [place] [address]    Culture  Setup Time   Final    GRAM NEGATIVE RODS AEROBIC BOTTLE ONLY CRITICAL VALUE NOTED.  VALUE IS CONSISTENT WITH PREVIOUSLY REPORTED AND CALLED VALUE. Performed at [facility], [address] [place], [place] [address]    Culture (A)  Final    KLEBSIELLA PNEUMONIAE SUSCEPTIBILITIES PERFORMED ON PREVIOUS CULTURE WITHIN THE LAST 5 DAYS. Performed at [facility], [address], [place], [place] [address]    Report Status 11/28/2019 FINAL  Final  Urine culture     Status: Abnormal   Collection Time: 11/23/2019 10:21 AM   Specimen: In/Out Cath Urine  Result Value Ref Range Status   Specimen Description   Final    IN/OUT CATH URINE Performed at [facility], [address] [place], [place] [address]    Special Requests   Final    NONE Performed at [facility], [address] [place], [place] [address]    Culture (A)  Final    >=100,000 COLONIES/mL PROTEUS MIRABILIS 30,000 COLONIES/mL CITROBACTER FREUNDII    Report Status 11/29/2019 FINAL  Final   Organism ID, Bacteria PROTEUS MIRABILIS (A)  Final   Organism ID, Bacteria CITROBACTER FREUNDII (A)  Final      Susceptibility   Citrobacter freundii - MIC*    CEFAZOLIN >=64 RESISTANT Resistant     CEFTRIAXONE <=0.25 SENSITIVE Sensitive     CIPROFLOXACIN <=0.25 SENSITIVE Sensitive     GENTAMICIN <=1 SENSITIVE Sensitive     IMIPENEM 0.5 SENSITIVE Sensitive     NITROFURANTOIN <=16 SENSITIVE Sensitive     TRIMETH/SULFA <=20 SENSITIVE Sensitive     PIP/TAZO <=4 SENSITIVE Sensitive     * 30,000 COLONIES/mL CITROBACTER FREUNDII   Proteus mirabilis - MIC*    AMPICILLIN >=32 RESISTANT Resistant     CEFAZOLIN >=64 RESISTANT Resistant     CEFTRIAXONE 32 RESISTANT Resistant     CIPROFLOXACIN >=4  RESISTANT Resistant     GENTAMICIN >=16 RESISTANT Resistant     IMIPENEM 2 SENSITIVE Sensitive     NITROFURANTOIN 128 RESISTANT Resistant     TRIMETH/SULFA >=320 RESISTANT Resistant     AMPICILLIN/SULBACTAM >=32 RESISTANT Resistant     PIP/TAZO <=4 SENSITIVE Sensitive     * >=100,000 COLONIES/mL PROTEUS MIRABILIS  Blood Culture ID Panel (Reflexed)     Status: Abnormal   Collection Time: 12/04/2019 10:21 AM  Result Value Ref Range Status   Enterococcus faecalis NOT DETECTED NOT DETECTED Final   Enterococcus Faecium NOT DETECTED NOT DETECTED Final   Listeria monocytogenes NOT DETECTED NOT DETECTED Final   Staphylococcus species NOT DETECTED NOT DETECTED Final   Staphylococcus aureus (BCID) NOT DETECTED NOT DETECTED Final   Staphylococcus epidermidis NOT DETECTED NOT DETECTED Final   Staphylococcus lugdunensis NOT DETECTED NOT DETECTED Final   Streptococcus species NOT DETECTED NOT DETECTED Final   Streptococcus agalactiae NOT DETECTED NOT DETECTED Final   Streptococcus pneumoniae NOT DETECTED NOT DETECTED Final   Streptococcus pyogenes NOT DETECTED NOT DETECTED Final  A.calcoaceticus-baumannii NOT DETECTED NOT DETECTED Final   Bacteroides fragilis NOT DETECTED NOT DETECTED Final   Enterobacterales DETECTED (A) NOT DETECTED Final    Comment: Enterobacterales represent a large order of gram negative bacteria, not a single organism. CRITICAL RESULT CALLED TO, READ BACK BY AND VERIFIED WITH: DAVID BESANTI 11/26/19 AT 0255 HS    Enterobacter cloacae complex NOT DETECTED NOT DETECTED Final   Escherichia coli NOT DETECTED NOT DETECTED Final   Klebsiella aerogenes NOT DETECTED NOT DETECTED Final   Klebsiella oxytoca NOT DETECTED NOT DETECTED Final   Klebsiella pneumoniae DETECTED (A) NOT DETECTED Final    Comment: CRITICAL RESULT CALLED TO, READ BACK BY AND VERIFIED WITH: DAVID BESANTI 11/26/19 AT 0255 HS    Proteus species NOT DETECTED NOT DETECTED Final   Salmonella species NOT DETECTED  NOT DETECTED Final   Serratia marcescens NOT DETECTED NOT DETECTED Final   Haemophilus influenzae NOT DETECTED NOT DETECTED Final   Neisseria meningitidis NOT DETECTED NOT DETECTED Final   Pseudomonas aeruginosa NOT DETECTED NOT DETECTED Final   Stenotrophomonas maltophilia NOT DETECTED NOT DETECTED Final   Candida albicans NOT DETECTED NOT DETECTED Final   Candida auris NOT DETECTED NOT DETECTED Final   Candida glabrata NOT DETECTED NOT DETECTED Final   Candida krusei NOT DETECTED NOT DETECTED Final   Candida parapsilosis NOT DETECTED NOT DETECTED Final   Candida tropicalis NOT DETECTED NOT DETECTED Final   Cryptococcus neoformans/gattii NOT DETECTED NOT DETECTED Final   CTX-M ESBL NOT DETECTED NOT DETECTED Final   Carbapenem resistance IMP NOT DETECTED NOT DETECTED Final   Carbapenem resistance KPC NOT DETECTED NOT DETECTED Final   Carbapenem resistance NDM NOT DETECTED NOT DETECTED Final   Carbapenem resist OXA 48 LIKE NOT DETECTED NOT DETECTED Final   Carbapenem resistance VIM NOT DETECTED NOT DETECTED Final    Comment: Performed at [facility], [address] [place], [place] [address]  SARS Coronavirus 2 by RT PCR (hospital order, performed in [facility] hospital lab) Nasopharyngeal Nasopharyngeal Swab     Status: None   Collection Time: 12/25/19 12:39 PM   Specimen: Nasopharyngeal Swab  Result Value Ref Range Status   SARS Coronavirus 2 NEGATIVE NEGATIVE Final    Comment: (NOTE) SARS-CoV-2 target nucleic acids are NOT DETECTED.  The SARS-CoV-2 RNA is generally detectable in upper and lower respiratory specimens during the acute phase of infection. The lowest concentration of SARS-CoV-2 viral copies this assay can detect is 250 copies / mL. A negative result does not preclude SARS-CoV-2 infection and should not be used as the sole basis for treatment or other patient management decisions.  A negative result may occur with improper specimen collection / handling,  submission of specimen other than nasopharyngeal swab, presence of viral mutation(s) within the areas targeted by this assay, and inadequate number of viral copies (<250 copies / mL). A negative result must be combined with clinical observations, patient history, and epidemiological information.  Fact Sheet for Patients:   BoilerBrush.com.cy  Fact Sheet for Healthcare Providers: https://pope.com/  This test is not yet approved or  cleared by the [place] FDA and has been authorized for detection and/or diagnosis of SARS-CoV-2 by FDA under an Emergency Use Authorization (EUA).  This EUA will remain in effect (meaning this test can be used) for the duration of the COVID-19 declaration under Section 564(b)(1) of the Act, 21 U.S.C. section 360bbb-3(b)(1), unless the authorization is terminated or revoked sooner.  Performed at [facility], [address]  [place], [place] [address]   C Difficile Quick Screen w PCR reflex     Status: None   Collection Time: 11/27/19  6:18 AM   Specimen: STOOL  Result Value Ref Range Status   C Diff antigen NEGATIVE NEGATIVE Final   C Diff toxin NEGATIVE NEGATIVE Final   C Diff interpretation No C. difficile detected.  Final    Comment: Performed at [facility], [address] [place], [place] [address]  MRSA PCR Screening     Status: None   Collection Time: 11/27/19 11:30 PM   Specimen: Nasal Mucosa; Nasopharyngeal  Result Value Ref Range Status   MRSA by PCR NEGATIVE NEGATIVE Final    Comment:        The GeneXpert MRSA Assay (FDA approved for NASAL specimens only), is one component of a comprehensive MRSA colonization surveillance program. It is not intended to diagnose MRSA infection nor to guide or monitor treatment for MRSA infections. Performed at [facility], [address] [place], [place] [address]   CULTURE, BLOOD (ROUTINE X 2) w Reflex to ID  Panel     Status: None   Collection Time: 11/28/19 11:27 AM   Specimen: BLOOD  Result Value Ref Range Status   Specimen Description BLOOD LEFT ARM  Final   Special Requests   Final    BOTTLES DRAWN AEROBIC AND ANAEROBIC Blood Culture adequate volume   Culture   Final    NO GROWTH 5 DAYS Performed at [facility], [address] [place], [place] [address]    Report Status 12/03/2019 FINAL  Final  CULTURE, BLOOD (ROUTINE X 2) w Reflex to ID Panel     Status: None   Collection Time: 11/28/19 11:49 AM   Specimen: BLOOD  Result Value Ref Range Status   Specimen Description BLOOD RIGHT ARM  Final   Special Requests   Final    BOTTLES DRAWN AEROBIC AND ANAEROBIC Blood Culture adequate volume   Culture   Final    NO GROWTH 5 DAYS Performed at [facility], [address] [place], [place] [address]    Report Status 12/03/2019 FINAL  Final     Studies: No results found.  Scheduled Meds: . acetylcysteine  3 mL Nebulization BID  . ARIPiprazole  2 mg Oral Daily  . aspirin EC  81 mg Oral Daily  . baclofen  5 mg Oral TID  . [START ON 12/05/2019] cephALEXin  500 mg Oral Q6H  . Chlorhexidine Gluconate Cloth  6 each Topical Daily  . enoxaparin (LOVENOX) injection  40 mg Subcutaneous Q24H  . famotidine  20 mg Oral Daily  . furosemide  20 mg Intravenous BID  . ipratropium-albuterol  3 mL Nebulization Q6H  . levothyroxine  150 mcg Oral QAC breakfast  . midodrine  10 mg Oral TID WC  . multivitamin-lutein  1 capsule Oral Daily  . potassium chloride  10 mEq Oral BID  . pregabalin  100 mg Oral BID  . traZODone  50 mg Oral QHS  . venlafaxine XR  225 mg Oral Daily  . vitamin B-12  1,000 mcg Oral Daily  . zinc oxide   Topical BID   Continuous Infusions: . sodium chloride Stopped (12/04/19 0530)    Assessment/Plan:  1. Vomited twice this morning.  IV Zofran as needed. 2. Acute diastolic congestive heart failure change Lasix back to IV with the vomiting. 3. Acute  hypoxic respiratory failure on oxygen.  Qualifies for home oxygen. 4. Hypotension on  midodrine 10 mg 3 times a day. 5. Septic shock secondary to Klebsiella.  Rocephin switched over to Keflex for continuation of course for tomorrow. 6. 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  Continue local wound care. 7. Acute metabolic encephalopathy this was better this morning. 8. Small right pleural effusion. 9. Dysphagia.  Continue dysphagia 1 diet with nectar thick liquids.  High risk for aspiration. 10. Severe protein calorie malnutrition 11. Hypothyroidism unspecified.  Continue levothyroxine 12. Increased Lyrica dose today secondary to arm pain 13. Left arm pain and tricep area improved with massage.  Pressure Injury 09/29/19 Leg Left;Posterior;Lower Stage 2 -  Partial thickness loss of dermis presenting as a shallow open injury with a red, pink wound bed without slough. three small, round stage two pressure ulcers. (Active)  09/29/19 1445  Location: Leg  Location Orientation: Left;Posterior;Lower  Staging: Stage 2 -  Partial thickness loss of dermis presenting as a shallow open injury with a red, pink wound bed without slough.  Wound Description (Comments): three small, round stage two pressure ulcers.  Present on Admission: Yes     Pressure Injury 11/27/19 Sacrum Stage 4 - Full thickness tissue loss with exposed bone, tendon or muscle. (Active)  11/27/19 2100  Location: Sacrum  Location Orientation:   Staging: Stage 4 - Full thickness tissue loss with exposed bone, tendon or muscle.  Wound Description (Comments):   Present on Admission: Yes     Pressure Injury 11/28/19 Buttocks Upper Stage 4 - Full thickness tissue loss with exposed bone, tendon or muscle. (Active)  11/28/19   Location: Buttocks  Location Orientation: Upper  Staging: Stage 4 - Full thickness tissue loss with exposed bone, tendon or muscle.  Wound Description (Comments):   Present on Admission: Yes       Code Status:      Code Status Orders  (From admission, onward)         Start     Ordered   12/08/2019 1459  Full code  Continuous        12/10/2019 1459        Code Status History    Date Active Date Inactive Code Status Order ID Comments User Context   09/29/2019 1309 10/05/2019 2114 Full Code [id number]  Salena SanerGonzalez, Carmen L, MD ED   01/15/2017 2127 02/11/2017 1733 Full Code [id number]  Houston SirenSainani, Vivek J, MD ED   01/01/2017 2006 01/03/2017 1805 Full Code [id number]  Shaune Pollackhen, Qing, MD ED   12/19/2016 0135 12/20/2016 1557 Full Code [id number]  Oralia ManisWillis, David, MD Inpatient   09/12/2016 0513 09/13/2016 1812 Full Code [id number]  Arnaldo Nataliamond, Michael S, MD Inpatient   08/14/2016 1930 08/15/2016 1942 Full Code [id number]  Alford HighlandWieting, Verlia Kaney, MD ED   07/06/2016 0749 07/07/2016 2105 Full Code [id number]  Arnaldo Nataliamond, Michael S, MD Inpatient   Advance Care Planning Activity    Advance Directive Documentation     Most Recent Value  Type of Advance Directive Healthcare Power of Attorney  Pre-existing out of facility DNR order (yellow form or pink MOST form) --  "MOST" Form in Place? --     Family Communication: Son and husband at the bedside Disposition Plan: Status is: Inpatient  Dispo:  Patient From: Home  Planned Disposition: Home  Expected discharge date: Every day try to go into discharge home.  Patient vomited 2 times this morning.  Antibiotics:  Rocephin changed over to p.o. Keflex  Time spent: 27 minutes  Bobbette Eakes [employer]ieting  [facility]

## 2019-12-04 NOTE — Progress Notes (Signed)
Patient ID: Jessica [name], female   DOB: December 17, 1945, 74 y.o.   MRN: [id number]  This NP visited patient at the bedside as a follow up to last weeks goals of care meeting and for palliative medicine needs and emotional support.  Patient is minimally responsive, unable to follow commands. She appears generally uncomfortable.   Husband at bedside.  Continue conversation regarding current medical situation.  Education offered on the concept of adult failure to thrive and the limitations of medical interventions to prolong life when the body does fail to thrive. Education offered on the difference between aggressive medical intervention path and a palliative comfort path for this patient at this time in this situation was had.  Introduced the concept of hospice philosophy and hospice care in the home.  I worry that husband has limited insight into the patient's medical situation and viable medical interventions to impact patient's long-term prognosis.  He tells me that "I just want her to be better than this so I can take her home".  Education offered on the likelihood that patient will continue to have infection/pneumonias and UTIs,  secondary to bedbound status, poor p.o. intake, weakness and inability to clear secretions. She is high risk for skin breakdown.  She is high risk for overall decompensation and  her long term prognosis is poor.  Education offered on the importance of continued conversation with husband/son  and the  medical providers regarding overall plan of care and treatment options,  ensuring decisions are within the context of the patients values and GOCs.  Stressed the importance of conversation and documentation of advanced directives. Again discussed CODE STATUS and  encouraged family to consider DNR/DNI status understanding evidenced based poor outcomes in similar hospitalized patient, as the cause of arrest is likely associated with advanced chronic illness rather than an  easily reversible acute cardio-pulmonary event.  Questions and concerns addressed   PMT will continue to support holistically   Total time spent on the unit was 35 minutes  Greater than 50% of the time was spent in counseling and coordination of care  Lorinda Creed NP  Palliative Medicine Team Team Phone # [phone] Pager [phone]

## 2019-12-04 NOTE — Progress Notes (Deleted)
   12/04/19 1713  Assess: MEWS Score  Temp 98.5 F (36.9 C)  BP 107/63  Pulse Rate (!) 115  Resp 15  SpO2 100 %  O2 Device Room Air  Assess: MEWS Score  MEWS Temp 0  MEWS Systolic 0  MEWS Pulse 2  MEWS RR 0  MEWS LOC 0  MEWS Score 2  MEWS Score Color Yellow  Assess: if the MEWS score is Yellow or Red  Were vital signs taken at a resting state? Yes  Focused Assessment Change from prior assessment (see assessment flowsheet)  Early Detection of Sepsis Score *See Row Information* Medium  MEWS guidelines implemented *See Row Information* Yes  Treat  MEWS Interventions Escalated (See documentation below)  Take Vital Signs  Increase Vital Sign Frequency  Yellow: Q 2hr X 2 then Q 4hr X 2, if remains yellow, continue Q 4hrs  Escalate  MEWS: Escalate Yellow: discuss with charge nurse/RN and consider discussing with provider and RRT  Notify: Charge Nurse/RN  Name of Charge Nurse/RN Notified Teresa C.  Date Charge Nurse/RN Notified 12/04/19  Time Charge Nurse/RN Notified 1740  Notify: Provider  Provider Name/Title Dr. Hilton Sinclair  Date Provider Notified 12/04/19  Time Provider Notified 1800  Notification Type Page  Notification Reason Other (Comment) (elevating HR)  Response No new orders  Date of Provider Response 12/04/19  Time of Provider Response [phone]

## 2019-12-04 NOTE — Progress Notes (Signed)
   12/04/19 1713  Assess: MEWS Score  Temp 98.5 F (36.9 C)  BP 107/63  Pulse Rate (!) 115  Resp 15  SpO2 100 %  O2 Device Room Air  Assess: MEWS Score  MEWS Temp 0  MEWS Systolic 0  MEWS Pulse 2  MEWS RR 0  MEWS LOC 0  MEWS Score 2  MEWS Score Color Yellow  Assess: if the MEWS score is Yellow or Red  Were vital signs taken at a resting state? Yes  Focused Assessment Change from prior assessment (see assessment flowsheet)  Early Detection of Sepsis Score *See Row Information* Medium  MEWS guidelines implemented *See Row Information* Yes  Treat  MEWS Interventions Escalated (See documentation below)  Take Vital Signs  Increase Vital Sign Frequency  Yellow: Q 2hr X 2 then Q 4hr X 2, if remains yellow, continue Q 4hrs  Escalate  MEWS: Escalate Yellow: discuss with charge nurse/RN and consider discussing with provider and RRT  Notify: Charge Nurse/RN  Name of Charge Nurse/RN Notified Teresa C.  Date Charge Nurse/RN Notified 12/04/19  Time Charge Nurse/RN Notified 1740  Notify: Provider  Provider Name/Title Dr. Hilton Sinclair  Date Provider Notified 12/04/19  Time Provider Notified 1800  Notification Type Page  Notification Reason Other (Comment) (elevating HR)  Response No new orders (MD will re-evaluate 12/05/19)  Date of Provider Response 12/04/19  Time of Provider Response 1804  Document  Patient Outcome Other (Comment) (no new orders)  Progress note created (see row info) Yes

## 2019-12-04 NOTE — Progress Notes (Signed)
Pt with adventitious lung sounds (pleural rubs) on bilateral upper lung field.  MD notified. MD placed Duoneb and Mucmyst treatment.  Will continue to monitor.

## 2019-12-04 NOTE — Care Management Important Message (Signed)
Important Message  Patient Details  Name: Jessica [name] MRN: [id number] Date of Birth: 1945/04/17   Medicare Important Message Given:  Yes     Johnell Comings 12/04/2019, 11:30 AM

## 2019-12-04 NOTE — Progress Notes (Signed)
Spoke with Patient's husband regarding his concerns for his wife.  Mr Stenzel stated " its just me and my son who take care of her and I don't think she is ready for discharge"  Mr Wolford expressed his concerns as to whether his wife still had the urinary tract infection and if she possibly has pneumonia.  Mr Malter expressed wanting a second opinion before he takes her home.  This RN educated Mr Fingerhut, that the entire medical team from her doctors, nurses and other medical staff are doing everything possible to ensure Mrs. Lindamood receives  The best possible care. Also, Mr Galster was informed that there will be a progression meeting tomorrow to ensure if Mrs.Mick is ready for discharge home and if not what the next steps would be for her care.  He verbalized understanding and was appreciative of our conversation.  Patient resting quietly in bed.

## 2019-12-05 ENCOUNTER — Inpatient Hospital Stay: Payer: Medicare Other

## 2019-12-05 ENCOUNTER — Ambulatory Visit: Payer: Medicare Other | Admitting: Physician Assistant

## 2019-12-05 DIAGNOSIS — R5383 Other fatigue: Secondary | ICD-10-CM

## 2019-12-05 DIAGNOSIS — R531 Weakness: Secondary | ICD-10-CM

## 2019-12-05 DIAGNOSIS — J9621 Acute and chronic respiratory failure with hypoxia: Secondary | ICD-10-CM

## 2019-12-05 LAB — BASIC METABOLIC PANEL
Anion gap: 12 (ref 5–15)
BUN: 9 mg/dL (ref 8–23)
CO2: 28 mmol/L (ref 22–32)
Calcium: 7.6 mg/dL — ABNORMAL LOW (ref 8.9–10.3)
Chloride: 106 mmol/L (ref 98–111)
Creatinine, Ser: 0.64 mg/dL (ref 0.44–1.00)
GFR calc Af Amer: >60 mL/min (ref >60)
GFR calc non Af Amer: >60 mL/min (ref >60)
Glucose, Bld: 80 mg/dL (ref 70–99)
Potassium: 4.2 mmol/L (ref 3.5–5.1)
Sodium: 146 mmol/L — ABNORMAL HIGH (ref 135–145)

## 2019-12-05 LAB — GLUCOSE, CAPILLARY
Glucose-Capillary: 91 mg/dL (ref 70–99)
Glucose-Capillary: 78 mg/dL (ref 70–99)

## 2019-12-05 LAB — CBC
HCT: 28.6 % — ABNORMAL LOW (ref 36.0–46.0)
Hemoglobin: 9.6 g/dL — ABNORMAL LOW (ref 12.0–15.0)
MCH: 30.7 pg (ref 26.0–34.0)
MCHC: 33.6 g/dL (ref 30.0–36.0)
MCV: 91.4 fL (ref 80.0–100.0)
Platelets: 249 10*3/uL (ref 150–400)
RBC: 3.13 MIL/uL — ABNORMAL LOW (ref 3.87–5.11)
RDW: 17.4 % — ABNORMAL HIGH (ref 11.5–15.5)
WBC: 17.5 10*3/uL — ABNORMAL HIGH (ref 4.0–10.5)
nRBC: 0 % (ref 0.0–0.2)

## 2019-12-05 LAB — BLOOD GAS, ARTERIAL
Acid-Base Excess: 7.6 mmol/L — ABNORMAL HIGH (ref 0.0–2.0)
Bicarbonate: 31 mmol/L — ABNORMAL HIGH (ref 20.0–28.0)
FIO2: 0.28
O2 Saturation: 97.1 %
Patient temperature: 37
pCO2 arterial: 38 mmHg (ref 32.0–48.0)
pH, Arterial: 7.52 — ABNORMAL HIGH (ref 7.350–7.450)
pO2, Arterial: 82 mmHg — ABNORMAL LOW (ref 83.0–108.0)

## 2019-12-05 LAB — MAGNESIUM: Magnesium: 1.8 mg/dL (ref 1.7–2.4)

## 2019-12-05 LAB — LACTIC ACID, PLASMA: Lactic Acid, Venous: 2.7 mmol/L (ref 0.5–1.9)

## 2019-12-05 MED ORDER — ACETAMINOPHEN 650 MG RE SUPP: 650.0000 mg | Freq: Four times a day (QID) | RECTAL | Status: DC | PRN

## 2019-12-05 MED ORDER — NOREPINEPHRINE 4 MG/250ML-% IV SOLN
2.0000 ug/min | INTRAVENOUS | Status: DC
  Administered 2019-12-05: 2 ug/min via INTRAVENOUS
  Filled 2019-12-05: qty 250

## 2019-12-05 MED ORDER — POLYVINYL ALCOHOL 1.4 % OP SOLN
1.0000 [drp] | Freq: Four times a day (QID) | OPHTHALMIC | Status: DC | PRN
  Filled 2019-12-05: qty 15

## 2019-12-05 MED ORDER — DEXTROSE 5 % IV SOLN: INTRAVENOUS | Status: DC

## 2019-12-05 MED ORDER — GLYCOPYRROLATE 0.2 MG/ML IJ SOLN
0.2000 mg | INTRAMUSCULAR | Status: DC | PRN
  Filled 2019-12-05: qty 1

## 2019-12-05 MED ORDER — SODIUM CHLORIDE 0.9 % IV SOLN
1.0000 g | Freq: Three times a day (TID) | INTRAVENOUS | Status: DC
  Administered 2019-12-05: 1 g via INTRAVENOUS
  Filled 2019-12-05 (×4): qty 1

## 2019-12-05 MED ORDER — MIDODRINE HCL 5 MG PO TABS
10.0000 mg | ORAL_TABLET | Freq: Once | ORAL | Status: AC
  Administered 2019-12-05: 10 mg via ORAL
  Filled 2019-12-05: qty 2

## 2019-12-05 MED ORDER — MIDAZOLAM HCL (PF) 2 MG/2ML IJ SOLN
2.0000 mg | INTRAMUSCULAR | Status: DC | PRN
  Administered 2019-12-06: 4 mg via INTRAVENOUS
  Filled 2019-12-05 (×2): qty 4
  Filled 2019-12-05: qty 2

## 2019-12-05 MED ORDER — SODIUM CHLORIDE 0.9 % IV SOLN: INTRAVENOUS | Status: DC

## 2019-12-05 MED ORDER — NOREPINEPHRINE 4 MG/250ML-% IV SOLN: 0.0000 ug/min | INTRAVENOUS | Status: DC

## 2019-12-05 MED ORDER — GLYCOPYRROLATE 1 MG PO TABS
1.0000 mg | ORAL_TABLET | ORAL | Status: DC | PRN
  Filled 2019-12-05: qty 1

## 2019-12-05 MED ORDER — ASPIRIN 300 MG RE SUPP
300.0000 mg | Freq: Every day | RECTAL | Status: DC
  Administered 2019-12-05: 300 mg via RECTAL
  Filled 2019-12-05: qty 1

## 2019-12-05 MED ORDER — DIPHENHYDRAMINE HCL 50 MG/ML IJ SOLN: 25.0000 mg | INTRAMUSCULAR | Status: DC | PRN

## 2019-12-05 MED ORDER — ACETAMINOPHEN 325 MG PO TABS: 650.0000 mg | ORAL_TABLET | Freq: Four times a day (QID) | ORAL | Status: DC | PRN

## 2019-12-05 MED ORDER — SODIUM CHLORIDE 0.9 % IV BOLUS
500.0000 mL | Freq: Once | INTRAVENOUS | Status: AC
  Administered 2019-12-05: 500 mL via INTRAVENOUS

## 2019-12-05 MED ORDER — SODIUM CHLORIDE 0.9 % IV SOLN: 250.0000 mL | INTRAVENOUS | Status: DC

## 2019-12-05 MED ORDER — MORPHINE SULFATE (PF) 2 MG/ML IV SOLN
2.0000 mg | INTRAVENOUS | Status: DC | PRN
  Administered 2019-12-05: 4 mg via INTRAVENOUS
  Administered 2019-12-05 – 2019-12-06 (×2): 2 mg via INTRAVENOUS
  Administered 2019-12-06: 4 mg via INTRAVENOUS
  Administered 2019-12-07 – 2019-12-09 (×4): 2 mg via INTRAVENOUS
  Filled 2019-12-05 (×3): qty 1
  Filled 2019-12-05 (×3): qty 2
  Filled 2019-12-05 (×2): qty 1

## 2019-12-05 NOTE — Progress Notes (Signed)
Pt transferred to ICU this am all belongings taken, husband and son at bedside.

## 2019-12-05 NOTE — Progress Notes (Signed)
The Clinical status was relayed to family in detail. Son and Husband at Bedside Family is very emotional, I have explained that patient is in dying process, patient son is very tearful and aggressive nature.   Husband and Son Jessica [name] at Bedside, they have consented to DNR/DNI.  Updated and notified of patients medical condition.  Patient remains unresponsive and will not open eyes to command.   patient with increased WOB and using accessory muscles to breathe Explained to family course of therapy and the modalities     Patient with Progressive multiorgan failure with very low chance of meaningful recovery despite all aggressive and optimal medical therapy.  Patient is in the dying  Process associated with suffering.  Family understands the situation.  They have consented and agreed to DNR/DNI   Family are satisfied with Plan of action and management. All questions answered     Lucie Leather, M.D.  [facility]  Medical Director [facility] Medical Director [facility] Cardio-Pulmonary Department

## 2019-12-05 NOTE — Progress Notes (Signed)
   12/04/19 2348  Assess: MEWS Score  Temp 99.2 F (37.3 C)  BP (!) 81/51  Pulse Rate 83  Resp 16  SpO2 91 %  O2 Device Nasal Cannula  Assess: MEWS Score  MEWS Temp 0  MEWS Systolic 1  MEWS Pulse 0  MEWS RR 0  MEWS LOC 0  MEWS Score 1  MEWS Score Color Green  Assess: if the MEWS score is Yellow or Red  Were vital signs taken at a resting state? Yes  Focused Assessment No change from prior assessment  Early Detection of Sepsis Score *See Row Information* Medium  MEWS guidelines implemented *See Row Information* Yes  Treat  Pain Scale PAINAD  Pain Score 0  Breathing 0  Negative Vocalization 0  Facial Expression 0  Body Language 0  Consolability 0  PAINAD Score 0  Neuro symptoms relieved by Rest  Patients response to intervention Unchanged  Escalate  MEWS: Escalate Yellow: discuss with charge nurse/RN and consider discussing with provider and RRT  Notify: Charge Nurse/RN  Name of Charge Nurse/RN Notified Merlene Morse  Date Charge Nurse/RN Notified 12/04/19  Time Charge Nurse/RN Notified 2355

## 2019-12-05 NOTE — Progress Notes (Signed)
Pt with soft BP this AM creating yellow Mews - MD made aware and gave order to hold 0800 lasix dose.

## 2019-12-05 NOTE — Progress Notes (Signed)
The Clinical status was relayed to family in detail. Son and Husband at Bedside  Updated and notified of patients medical condition.  Patient remains unresponsive and will not open eyes to command.   patient with increased WOB and using accessory muscles to breathe Explained to family course of therapy and the modalities     Patient with Progressive multiorgan failure with very low chance of meaningful recovery despite all aggressive and optimal medical therapy.  Patient is in the dying  Process associated with suffering.  Family understands the situation.  They have consented and agreed to DNR/DNI and would like to proceed with Comfort care measures.  Family are satisfied with Plan of action and management. All questions answered     Lucie Leather, M.D.  [facility]  Medical Director [facility] Medical Director [facility] Cardio-Pulmonary Department

## 2019-12-05 NOTE — Progress Notes (Signed)
Paged Dr. Renae Gloss, as second blood pressure after arrival to ICU 79/33 with MAP of 47. Dr. Renae Gloss reviewed new results for patient and ordered peripheral vasopressors for patient. Also called and spoke with Dr. Belia Heman (after informed by Dr. Renae Gloss that Dr. Belia Heman was consulted) to let him know that patient physically on the unit now. Dr. Belia Heman reviewed patient's results, ordered a lactic acid, and stated he would be up to assess the patient and touch base with the family. All of this communicated to patient's primary nurse Brandi when she returned from lunch

## 2019-12-05 NOTE — Progress Notes (Signed)
Patient ID: Jessica [name], female   DOB: 24-Jan-1946, 74 y.o.   MRN: [id number] [facility] PROGRESS NOTE  Jessica Bradyhyllis W Fier JXB:[id number] DOB: 24-Jan-1946 DOA: February 20, 2020 PCP: Annita BrodAsenso, Philip, MD  HPI/Subjective: Came into the room to evaluate the patient.  He is less responsive than she has been.  I gave her a sternal rub she opened up her eyes.  Patient unable to eat this morning and unable to take her meds.  Objective: Vitals:   12/05/19 0533 12/05/19 0740  BP: (!) 88/35 (!) 83/48  Pulse: (!) 118 (!) 107  Resp: 14 14  Temp: 99.8 F (37.7 C) 98.5 F (36.9 C)  SpO2: (!) 87% 100%    Intake/Output Summary (Last 24 hours) at 12/05/2019 0933 Last data filed at 12/04/2019 2252 Gross per 24 hour  Intake 240 ml  Output 650 ml  Net -410 ml   Filed Weights   03-05-20 1207 11/28/19 0600 12/03/19 0629  Weight: 97.3 kg 99.3 kg 130.6 kg    ROS: Review of Systems  Unable to perform ROS: Acuity of condition   Exam: Physical Exam HENT:     Mouth/Throat:     Mouth: Mucous membranes are dry.     Pharynx: No oropharyngeal exudate.  Eyes:     General: Lids are normal.     Conjunctiva/sclera: Conjunctivae normal.     Pupils: Pupils are equal, round, and reactive to light.     Comments: Patient resisted me opening up her eyes.  Cardiovascular:     Rate and Rhythm: Regular rhythm. Tachycardia present.     Heart sounds: Normal heart sounds, S1 normal and S2 normal.  Pulmonary:     Breath sounds: Examination of the right-lower field reveals decreased breath sounds. Examination of the left-lower field reveals decreased breath sounds. Decreased breath sounds present. No wheezing, rhonchi or rales.  Abdominal:     Palpations: Abdomen is soft.     Tenderness: There is no abdominal tenderness.  Musculoskeletal:     Right ankle: No swelling.     Left ankle: No swelling.  Skin:    General: Skin is warm.     Findings: No rash.  Neurological:     Mental Status: She is lethargic.        Data Reviewed: Basic Metabolic Panel: Recent Labs  Lab 11/29/19 0418 11/29/19 0811 11/30/19 0344 11/30/19 0344 12/01/19 0534 12/02/19 1446 12/03/19 0516 12/04/19 0407 12/05/19 0503  NA   < >  --  143  --  142 141  --  143 146*  K   < >  --  4.2  --  3.9 3.6  --  3.3* 4.2  CL   < >  --  111  --  109 106  --  105 106  CO2   < >  --  26  --  26 27  --  30 28  GLUCOSE   < >  --  99  --  77 100*  --  92 80  BUN   < >  --  <5*  --  <5* 6*  --  7* 9  CREATININE   < >  --  0.49   < > 0.49 0.55 0.56 0.61 0.64  CALCIUM   < >  --  8.0*  --  8.0* 8.0*  --  7.8* 7.6*  MG  --  1.9  --   --   --   --   --   --  1.8  PHOS  --  2.4*  --   --   --   --   --   --   --    < > = values in this interval not displayed.   Liver Function Tests: Recent Labs  Lab 11/29/19 0418 11/30/19 0344 12/01/19 0534  AST 104* 86* 66*  ALT 41 41 38  ALKPHOS 126 128* 119  BILITOT 2.1* 2.7* 2.0*  PROT 4.7* 5.0* 4.9*  ALBUMIN 2.2* 2.2* 2.1*   CBC: Recent Labs  Lab 11/29/19 0418 11/30/19 0344 12/01/19 0534 12/02/19 1446 12/04/19 0407  WBC 11.3* 12.2* 10.1 15.1* 13.9*  NEUTROABS 7.0 7.4 5.7  --   --   HGB 9.0* 9.5* 10.2* 9.8* 9.5*  HCT 27.5* 29.3* 31.2* 29.2* 29.3*  MCV 92.9 94.2 93.4 91.3 92.4  PLT 180 200 191 268 257   CBG: Recent Labs  Lab 12/04/19 0843 12/04/19 1237 12/04/19 1717 12/04/19 2213 12/05/19 0733  GLUCAP 92 94 93 95 91    Recent Results (from the past 240 hour(s))  Culture, blood (Routine x 2)     Status: Abnormal   Collection Time: 11/18/2019 10:21 AM   Specimen: BLOOD  Result Value Ref Range Status   Specimen Description   Final    BLOOD RIGHT ANTECUBITAL Performed at [facility], [address], [place], [place] [address]    Special Requests   Final    BOTTLES DRAWN AEROBIC AND ANAEROBIC Blood Culture results may not be optimal due to an excessive volume of blood received in culture bottles Performed at [facility], [address]  [place], [place] [address]    Culture  Setup Time   Final    Organism ID to follow GRAM NEGATIVE RODS IN BOTH AEROBIC AND ANAEROBIC BOTTLES CRITICAL RESULT CALLED TO, READ BACK BY AND VERIFIED WITH: DAVID BESANTI 11/26/19 AT 0255 HS Performed at [facility], [address] [place], [place] [address]    Culture KLEBSIELLA PNEUMONIAE (A)  Final   Report Status 11/28/2019 FINAL  Final   Organism ID, Bacteria KLEBSIELLA PNEUMONIAE  Final      Susceptibility   Klebsiella pneumoniae - MIC*    AMPICILLIN RESISTANT Resistant     CEFAZOLIN <=4 SENSITIVE Sensitive     CEFEPIME <=0.12 SENSITIVE Sensitive     CEFTAZIDIME <=1 SENSITIVE Sensitive     CEFTRIAXONE <=0.25 SENSITIVE Sensitive     CIPROFLOXACIN <=0.25 SENSITIVE Sensitive     GENTAMICIN <=1 SENSITIVE Sensitive     IMIPENEM <=0.25 SENSITIVE Sensitive     TRIMETH/SULFA <=20 SENSITIVE Sensitive     AMPICILLIN/SULBACTAM <=2 SENSITIVE Sensitive     PIP/TAZO <=4 SENSITIVE Sensitive     * KLEBSIELLA PNEUMONIAE  Culture, blood (Routine x 2)     Status: Abnormal   Collection Time: 11/28/2019 10:21 AM   Specimen: BLOOD  Result Value Ref Range Status   Specimen Description   Final    BLOOD RH Performed at [facility], [address] [place], [place] [address]    Special Requests   Final    BOTTLES DRAWN AEROBIC AND ANAEROBIC Blood Culture results may not be optimal due to an excessive volume of blood received in culture bottles Performed at [facility], [address] [place], [place] [address]    Culture  Setup Time   Final    GRAM NEGATIVE RODS AEROBIC BOTTLE ONLY CRITICAL VALUE NOTED.  VALUE IS CONSISTENT WITH PREVIOUSLY REPORTED AND CALLED VALUE. Performed at [employer]  [facility], [address] [place], [place] [address]    Culture (A)  Final    KLEBSIELLA PNEUMONIAE SUSCEPTIBILITIES PERFORMED ON PREVIOUS CULTURE WITHIN THE LAST 5 DAYS. Performed at [facility], [address]  [place], [place] [address]    Report Status 11/28/2019 FINAL  Final  Urine culture     Status: Abnormal   Collection Time: 11/17/2019 10:21 AM   Specimen: In/Out Cath Urine  Result Value Ref Range Status   Specimen Description   Final    IN/OUT CATH URINE Performed at [facility], [address] [place], [place] [address]    Special Requests   Final    NONE Performed at [facility], [address] [place], [place] [address]    Culture (A)  Final    >=100,000 COLONIES/mL PROTEUS MIRABILIS 30,000 COLONIES/mL CITROBACTER FREUNDII    Report Status 11/29/2019 FINAL  Final   Organism ID, Bacteria PROTEUS MIRABILIS (A)  Final   Organism ID, Bacteria CITROBACTER FREUNDII (A)  Final      Susceptibility   Citrobacter freundii - MIC*    CEFAZOLIN >=64 RESISTANT Resistant     CEFTRIAXONE <=0.25 SENSITIVE Sensitive     CIPROFLOXACIN <=0.25 SENSITIVE Sensitive     GENTAMICIN <=1 SENSITIVE Sensitive     IMIPENEM 0.5 SENSITIVE Sensitive     NITROFURANTOIN <=16 SENSITIVE Sensitive     TRIMETH/SULFA <=20 SENSITIVE Sensitive     PIP/TAZO <=4 SENSITIVE Sensitive     * 30,000 COLONIES/mL CITROBACTER FREUNDII   Proteus mirabilis - MIC*    AMPICILLIN >=32 RESISTANT Resistant     CEFAZOLIN >=64 RESISTANT Resistant     CEFTRIAXONE 32 RESISTANT Resistant     CIPROFLOXACIN >=4 RESISTANT Resistant     GENTAMICIN >=16 RESISTANT Resistant     IMIPENEM 2 SENSITIVE Sensitive     NITROFURANTOIN 128 RESISTANT Resistant     TRIMETH/SULFA >=320 RESISTANT Resistant     AMPICILLIN/SULBACTAM >=32 RESISTANT Resistant     PIP/TAZO <=4 SENSITIVE Sensitive     * >=100,000 COLONIES/mL PROTEUS MIRABILIS  Blood Culture ID Panel (Reflexed)     Status: Abnormal   Collection Time: 11/30/2019 10:21 AM  Result Value Ref Range Status   Enterococcus faecalis NOT DETECTED NOT DETECTED Final   Enterococcus Faecium NOT DETECTED NOT DETECTED Final   Listeria monocytogenes NOT DETECTED NOT DETECTED Final    Staphylococcus species NOT DETECTED NOT DETECTED Final   Staphylococcus aureus (BCID) NOT DETECTED NOT DETECTED Final   Staphylococcus epidermidis NOT DETECTED NOT DETECTED Final   Staphylococcus lugdunensis NOT DETECTED NOT DETECTED Final   Streptococcus species NOT DETECTED NOT DETECTED Final   Streptococcus agalactiae NOT DETECTED NOT DETECTED Final   Streptococcus pneumoniae NOT DETECTED NOT DETECTED Final   Streptococcus pyogenes NOT DETECTED NOT DETECTED Final   A.calcoaceticus-baumannii NOT DETECTED NOT DETECTED Final   Bacteroides fragilis NOT DETECTED NOT DETECTED Final   Enterobacterales DETECTED (A) NOT DETECTED Final    Comment: Enterobacterales represent a large order of gram negative bacteria, not a single organism. CRITICAL RESULT CALLED TO, READ BACK BY AND VERIFIED WITH: DAVID BESANTI 11/26/19 AT 0255 HS    Enterobacter cloacae complex NOT DETECTED NOT DETECTED Final   Escherichia coli NOT DETECTED NOT DETECTED Final   Klebsiella aerogenes NOT DETECTED NOT DETECTED Final   Klebsiella oxytoca NOT DETECTED NOT DETECTED Final   Klebsiella pneumoniae DETECTED (A) NOT DETECTED Final    Comment: CRITICAL RESULT CALLED TO, READ BACK BY AND VERIFIED  WITH: DAVID BESANTI 11/26/19 AT 0255 HS    Proteus species NOT DETECTED NOT DETECTED Final   Salmonella species NOT DETECTED NOT DETECTED Final   Serratia marcescens NOT DETECTED NOT DETECTED Final   Haemophilus influenzae NOT DETECTED NOT DETECTED Final   Neisseria meningitidis NOT DETECTED NOT DETECTED Final   Pseudomonas aeruginosa NOT DETECTED NOT DETECTED Final   Stenotrophomonas maltophilia NOT DETECTED NOT DETECTED Final   Candida albicans NOT DETECTED NOT DETECTED Final   Candida auris NOT DETECTED NOT DETECTED Final   Candida glabrata NOT DETECTED NOT DETECTED Final   Candida krusei NOT DETECTED NOT DETECTED Final   Candida parapsilosis NOT DETECTED NOT DETECTED Final   Candida tropicalis NOT DETECTED NOT DETECTED Final    Cryptococcus neoformans/gattii NOT DETECTED NOT DETECTED Final   CTX-M ESBL NOT DETECTED NOT DETECTED Final   Carbapenem resistance IMP NOT DETECTED NOT DETECTED Final   Carbapenem resistance KPC NOT DETECTED NOT DETECTED Final   Carbapenem resistance NDM NOT DETECTED NOT DETECTED Final   Carbapenem resist OXA 48 LIKE NOT DETECTED NOT DETECTED Final   Carbapenem resistance VIM NOT DETECTED NOT DETECTED Final    Comment: Performed at [facility], [address] [place], [place] [address]  SARS Coronavirus 2 by RT PCR (hospital order, performed in [facility] hospital lab) Nasopharyngeal Nasopharyngeal Swab     Status: None   Collection Time: 12/10/19 12:39 PM   Specimen: Nasopharyngeal Swab  Result Value Ref Range Status   SARS Coronavirus 2 NEGATIVE NEGATIVE Final    Comment: (NOTE) SARS-CoV-2 target nucleic acids are NOT DETECTED.  The SARS-CoV-2 RNA is generally detectable in upper and lower respiratory specimens during the acute phase of infection. The lowest concentration of SARS-CoV-2 viral copies this assay can detect is 250 copies / mL. A negative result does not preclude SARS-CoV-2 infection and should not be used as the sole basis for treatment or other patient management decisions.  A negative result may occur with improper specimen collection / handling, submission of specimen other than nasopharyngeal swab, presence of viral mutation(s) within the areas targeted by this assay, and inadequate number of viral copies (<250 copies / mL). A negative result must be combined with clinical observations, patient history, and epidemiological information.  Fact Sheet for Patients:   BoilerBrush.com.cy  Fact Sheet for Healthcare Providers: https://pope.com/  This test is not yet approved or  cleared by the [place] FDA and has been authorized for detection and/or diagnosis of SARS-CoV-2 by FDA under an Emergency  Use Authorization (EUA).  This EUA will remain in effect (meaning this test can be used) for the duration of the COVID-19 declaration under Section 564(b)(1) of the Act, 21 U.S.C. section 360bbb-3(b)(1), unless the authorization is terminated or revoked sooner.  Performed at [facility], [address] [place], [place] [address]   C Difficile Quick Screen w PCR reflex     Status: None   Collection Time: 11/27/19  6:18 AM   Specimen: STOOL  Result Value Ref Range Status   C Diff antigen NEGATIVE NEGATIVE Final   C Diff toxin NEGATIVE NEGATIVE Final   C Diff interpretation No C. difficile detected.  Final    Comment: Performed at [facility], [address] [place], [place] [address]  MRSA PCR Screening     Status: None   Collection Time: 11/27/19 11:30 PM   Specimen: Nasal Mucosa; Nasopharyngeal  Result Value Ref Range Status   MRSA by PCR NEGATIVE NEGATIVE Final  Comment:        The GeneXpert MRSA Assay (FDA approved for NASAL specimens only), is one component of a comprehensive MRSA colonization surveillance program. It is not intended to diagnose MRSA infection nor to guide or monitor treatment for MRSA infections. Performed at [facility], [address] [place], [place] [address]   CULTURE, BLOOD (ROUTINE X 2) w Reflex to ID Panel     Status: None   Collection Time: 11/28/19 11:27 AM   Specimen: BLOOD  Result Value Ref Range Status   Specimen Description BLOOD LEFT ARM  Final   Special Requests   Final    BOTTLES DRAWN AEROBIC AND ANAEROBIC Blood Culture adequate volume   Culture   Final    NO GROWTH 5 DAYS Performed at [facility], [address] [place], [place] [address]    Report Status 12/03/2019 FINAL  Final  CULTURE, BLOOD (ROUTINE X 2) w Reflex to ID Panel     Status: None   Collection Time: 11/28/19 11:49 AM   Specimen: BLOOD  Result Value Ref Range Status   Specimen Description BLOOD RIGHT ARM  Final    Special Requests   Final    BOTTLES DRAWN AEROBIC AND ANAEROBIC Blood Culture adequate volume   Culture   Final    NO GROWTH 5 DAYS Performed at [facility], [address], [place], [place] [address]    Report Status 12/03/2019 FINAL  Final     Studies: No results found.  Scheduled Meds: . acetylcysteine  3 mL Nebulization BID  . ARIPiprazole  2 mg Oral Daily  . aspirin  300 mg Rectal Daily  . Chlorhexidine Gluconate Cloth  6 each Topical Daily  . enoxaparin (LOVENOX) injection  40 mg Subcutaneous Q24H  . famotidine  20 mg Oral Daily  . ipratropium-albuterol  3 mL Nebulization Q6H  . levothyroxine  150 mcg Oral QAC breakfast  . midodrine  10 mg Oral TID WC  . multivitamin-lutein  1 capsule Oral Daily  . venlafaxine XR  225 mg Oral Daily  . vitamin B-12  1,000 mcg Oral Daily  . zinc oxide   Topical BID   Continuous Infusions: . sodium chloride Stopped (12/04/19 0530)  . sodium chloride    . meropenem (MERREM) IV    . sodium chloride      Assessment/Plan:  1. Lethargy, low-grade fever last night, tachycardia.  We will get stat CT scan of the head to rule out stroke, rectal aspirin.  We will get ABG to see if she is CO2 retaining.  We will get more aggressive with antibiotics with meropenem.  Since she vomited yesterday we will get chest x-ray to see if there is aspiration.  Repeat blood cultures.  Get urine analysis urine culture. 2. Hypotension unclear if she will be able to take the midodrine.  Will give fluid bolus and transferred to the stepdown unit just in case pressors are needed. 3. Vomited twice yesterday.  As needed IV Zofran 4. Acute hypoxic respiratory failure on oxygen.  Continue oxygen keep saturations above 90%. 5. Previous septic shock with Klebsiella treated 10 days of IV Rocephin. 6. 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  Continue local wound care.  Has been seen by surgeons deviously and had debridement. 7. Small right pleural effusion.  Reassess with  x-ray today 8. Dysphagia.  Right now will make n.p.o. but has been on a dysphagia diet with nectar thick liquids 9. Severe protein calorie malnutrition 10.  Hypothyroidism unspecified.  Levothyroxine if able to take 11. Hold medications that can cause altered mental status including Lyrica, baclofen and tramadol.  Pressure Injury 09/29/19 Leg Left;Posterior;Lower Stage 2 -  Partial thickness loss of dermis presenting as a shallow open injury with a red, pink wound bed without slough. three small, round stage two pressure ulcers. (Active)  09/29/19 1445  Location: Leg  Location Orientation: Left;Posterior;Lower  Staging: Stage 2 -  Partial thickness loss of dermis presenting as a shallow open injury with a red, pink wound bed without slough.  Wound Description (Comments): three small, round stage two pressure ulcers.  Present on Admission: Yes     Pressure Injury 11/27/19 Sacrum Stage 4 - Full thickness tissue loss with exposed bone, tendon or muscle. (Active)  11/27/19 2100  Location: Sacrum  Location Orientation:   Staging: Stage 4 - Full thickness tissue loss with exposed bone, tendon or muscle.  Wound Description (Comments):   Present on Admission: Yes     Pressure Injury 11/28/19 Buttocks Upper Stage 4 - Full thickness tissue loss with exposed bone, tendon or muscle. (Active)  11/28/19   Location: Buttocks  Location Orientation: Upper  Staging: Stage 4 - Full thickness tissue loss with exposed bone, tendon or muscle.  Wound Description (Comments):   Present on Admission: Yes       Code Status:     Code Status Orders  (From admission, onward)         Start     Ordered   11/24/2019 1459  Full code  Continuous        12/04/2019 1459        Code Status History    Date Active Date Inactive Code Status Order ID Comments User Context   09/29/2019 1309 10/05/2019 2114 Full Code [id number]  Salena Saner, MD ED   01/15/2017 2127 02/11/2017 1733 Full Code [id number]  Houston Siren, MD ED   01/01/2017 2006 01/03/2017 1805 Full Code [id number]  Shaune Pollack, MD ED   12/19/2016 0135 12/20/2016 1557 Full Code [id number]  Oralia Manis, MD Inpatient   09/12/2016 0513 09/13/2016 1812 Full Code [id number]  Arnaldo Natal, MD Inpatient   08/14/2016 1930 08/15/2016 1942 Full Code [id number]  Alford Highland, MD ED   07/06/2016 0749 07/07/2016 2105 Full Code [id number]  Arnaldo Natal, MD Inpatient   Advance Care Planning Activity    Advance Directive Documentation     Most Recent Value  Type of Advance Directive Healthcare Power of Attorney  Pre-existing out of facility DNR order (yellow form or pink MOST form) --  "MOST" Form in Place? --     Family Communication: Spoke with husband at the bedside.  Spoke with son on the phone.  He was yelling at me asking for a second opinion.  I mentioned that she will have a new team member see her tomorrow.  We will consult critical care team also for second opinion that the son requested. Disposition Plan: Status is: Inpatient  Dispo:  Patient From: Home  Planned Disposition: Home  Expected discharge date: Not yet to be determined at this point with altered mental status today  Medically stable for discharge: Patient lethargic with low-grade temperature last night and hypotension this morning.  Consultants:  Consult critical care  Antibiotics:  Change antibiotics to meropenem  Time spent: 30 minutes  [employer]

## 2019-12-05 NOTE — Progress Notes (Signed)
Nutrition Follow-up  DOCUMENTATION CODES:   Obesity unspecified  INTERVENTION:  No appropriate nutrition interventions at this time. Will monitor for outcome of discussions regarding goals of care and for plan of care.  NUTRITION DIAGNOSIS:   Increased nutrient needs related to wound healing, catabolic illness (CHF) as evidenced by estimated needs.  Ongoing.  GOAL:   Patient will meet greater than or equal to 90% of their needs  Not met at this time.  MONITOR:   PO intake, Labs, Weight trends, Skin, I & O's  REASON FOR ASSESSMENT:   Consult Assessment of nutrition requirement/status  ASSESSMENT:   74 year old female with history of multiple UTIs, sacral decubitus ulcer, sacral osteomyelitis, spinal stenosis, hypothyroidism, HTN, dCHF, depression, chronic pain, and visual hallucinations admitted for acute metabolic encephalopathy secondary to severe sepsis related to chronic wounds  Patient transferred to ICU with decreased responsiveness and hypotension. Patient now NPO as she is unsafe for oral intake at this time. MD discussed with family and patient was made DNR/DNI. Discussed with RN. Will continue to monitor for outcome of discussions regarding goals of care and for plan of care. It appears patient was previously eating 40-100% of her meals.  Medications reviewed and include: famotidine, levothyroxine, Ocuvite daily, vitamin B12 1000 micrograms daily, meropenem, norepinephrine gtt.  Labs reviewed: CBG 78-91.  Diet Order:   Diet Order            Diet NPO time specified Except for: Sips with Meds  Diet effective now                EDUCATION NEEDS:   No education needs have been identified at this time  Skin:  Skin Assessment: Skin Integrity Issues: Skin Integrity Issues:: Stage II, Stage IV Stage II: left lower leg Stage IV: sacrum (6cm x 3cm x 1.5cm); buttocks (7cm x 4cm x 1.5cm)  Last BM:  12/04/2019 - small type 6  Height:   Ht Readings from Last 1  Encounters:  11/28/19 _0  (1.778 m)   Weight:   Wt Readings from Last 1 Encounters:  12/03/19 130.6 kg   BMI:  Body mass index is 41.32 kg/m.  Estimated Nutritional Needs:   Kcal:  1900-2100  Protein:  115-125  Fluid:  >/= 1.9 L  Jacklynn Barnacle, MS, RD, LDN Pager number available on Amion

## 2019-12-05 NOTE — Progress Notes (Signed)
   12/05/19 0122  Assess: MEWS Score  Temp 99 F (37.2 C)  BP (!) 89/51  Pulse Rate (!) 114  Resp 15  SpO2 97 %  O2 Device Nasal Cannula  Assess: MEWS Score  MEWS Temp 0  MEWS Systolic 1  MEWS Pulse 2  MEWS RR 0  MEWS LOC 0  MEWS Score 3  MEWS Score Color Yellow  Assess: if the MEWS score is Yellow or Red  Were vital signs taken at a resting state? Yes  Focused Assessment No change from prior assessment  Early Detection of Sepsis Score *See Row Information* Medium  MEWS guidelines implemented *See Row Information* Yes  Treat  MEWS Interventions Escalated (See documentation below)  Pain Scale PAINAD  Pain Score 0  Multiple Pain Sites No  Breathing 0  Negative Vocalization 0  Facial Expression 0  Body Language 0  Consolability 0  PAINAD Score 0  Neuro symptoms relieved by Rest  Patients response to intervention Unchanged  Escalate  MEWS: Escalate Yellow: discuss with charge nurse/RN and consider discussing with provider and RRT  Notify: Charge Nurse/RN  Name of Charge Nurse/RN Notified Merlene Morse   Date Charge Nurse/RN Notified 12/05/19  Time Charge Nurse/RN Notified 0142  Notify: Provider  Provider Name/Title E. Anna Genre NP  Date Provider Notified 12/05/19  Time Provider Notified [phone]  Notification Type Page  Notification Reason Other (Comment) (low BP)  Response See new orders  Date of Provider Response 12/05/19  Time of Provider Response 0143  Document  Patient Outcome Other (Comment) (waiting on intervtion results)  Progress note created (see row info) Yes

## 2019-12-05 NOTE — Progress Notes (Signed)
   12/05/19 0533  Assess: MEWS Score  Temp 99.8 F (37.7 C)  BP (!) 88/35  Pulse Rate (!) 118  Resp 14  SpO2 (!) 87 %  O2 Device Nasal Cannula  O2 Flow Rate (L/min) 2 L/min  Assess: MEWS Score  MEWS Temp 0  MEWS Systolic 1  MEWS Pulse 2  MEWS RR 0  MEWS LOC 0  MEWS Score 3  MEWS Score Color Yellow  Assess: if the MEWS score is Yellow or Red  Were vital signs taken at a resting state? Yes  Focused Assessment No change from prior assessment  Early Detection of Sepsis Score *See Row Information* Medium  MEWS guidelines implemented *See Row Information* Yes  Treat  MEWS Interventions Other (Comment) (Low BP)  Pain Scale PAINAD  Multiple Pain Sites No  Breathing 0  Negative Vocalization 0  Facial Expression 0  Body Language 0  Consolability 0  PAINAD Score 0  Take Vital Signs  Increase Vital Sign Frequency  Yellow: Q 2hr X 2 then Q 4hr X 2, if remains yellow, continue Q 4hrs  Escalate  MEWS: Escalate Yellow: discuss with charge nurse/RN and consider discussing with provider and RRT  Notify: Charge Nurse/RN  Date Charge Nurse/RN Notified 12/05/19  Time Charge Nurse/RN Notified 0533  Notify: Provider  Provider Name/Title E. Anna Genre  Date Provider Notified 12/05/19  Time Provider Notified [phone]  Notification Type Page  Notification Reason Other (Comment) (low BP)  Response No new orders

## 2019-12-06 LAB — BLOOD CULTURE ID PANEL (REFLEXED) - BCID2

## 2019-12-06 MED ORDER — DAKINS (1/4 STRENGTH) 0.125 % EX SOLN
Freq: Two times a day (BID) | CUTANEOUS | Status: DC
  Filled 2019-12-06: qty 473

## 2019-12-06 NOTE — Progress Notes (Signed)
   12/06/19 1245  Clinical Encounter Type  Visited With Family  Visit Type Follow-up  Referral From Chaplain  Consult/Referral To Chaplain  Chaplain saw patient's husband and son in the hall. Son wanted a soda and chaplain brought son a soda because neither him nor his father had change on them. Chaplain will follow up with family later.

## 2019-12-06 NOTE — Progress Notes (Signed)
°   12/05/19 1300  Clinical Encounter Type  Visited With Patient and family together  Visit Type Follow-up;Critical Care  Referral From Chaplain  Consult/Referral To Chaplain  Chaplain responded to page. When she arrived at patient's room husband and son were at bedside. Son is grieving in an unhealthy manner. Doctor spoke with family explaining patient's prognosis and son is having a hard time with information. Husband is coming to grips with his wife condition and spoke with chaplain about him preparing for her funeral. He called his brother-in-law who gave him the name of a funeral home. Chaplain called [employer] in [place] to find out next steps after patient is deceased. Chaplain walked husband to the bathroom and asked chaplain if she was going back to the room and she said yes. Chaplain stayed with family over 4 hrs.Before leaving chaplain introduced family to the on-call chaplain and told them she would check on them in the morning.

## 2019-12-06 NOTE — Consult Note (Addendum)
WOC Nurse Consult Note: In accordance with Surgery's input from their consult on 8/18 and family's request for topical treatment plans on 8/20, we will continue the Dakin's solution to the sacrum and buttock wound for an additional 3 days.  Pt is now with comfort care goals; if family wishes to discontinue topical treatment, then that plan of action would be appropriate.  Please re-consult if further assistance is needed.  Thank-you,  Cammie Mcgee MSN, RN, CWOCN, [place], CNS [phone]

## 2019-12-06 NOTE — Progress Notes (Addendum)
   12/06/19 0700  Clinical Encounter Type  Visited With Patient and family together  Visit Type Follow-up;Critical Care  Referral From Chaplain  Consult/Referral To Chaplain  Chaplain checked in on patient and family and patient is doing the same. When chaplain arrived at room,, she found son on the floor, he was sick to his stomach. Chaplain got him some ice and ginger ale. Neither husband nor son have eaten, therefore chaplain spoke with [facility] requesting breakfast for them. Chaplain asked Secretary to request a new comfort cart. AC said she would take care of it. Once trays were ordered [facility] called chaplain and chaplain thanked her. Chaplain will follow up later.

## 2019-12-06 NOTE — Progress Notes (Addendum)
PROGRESS NOTE    Jessica [name]  LOV:[id number] DOB: 13-Apr-1946 DOA: 11/28/2019 PCP: Annita Brod, MD   Assessment & Plan:   Active Problems:   Tachycardia   Decubital ulcer   Acute diastolic CHF (congestive heart failure) (HCC)   Acute on chronic respiratory failure with hypoxia (HCC)   Septic shock (HCC)   Atelectasis, right   Pleural effusion on right   Klebsiella sepsis (HCC)   Acute metabolic encephalopathy   Severe protein-calorie malnutrition (HCC)   Adult failure to thrive   Palliative care by specialist   DNR (do not resuscitate) discussion   Hypotension   Diarrhea   Nausea and vomiting   Weakness generalized   Lethargy  Multiorgan failure: continue w/ comfort care only   Adult failure to thrive: continue w/ comfort care only   Acute hypoxic respiratory failure: continue w/ comfort care only. Continue on supplemental oxygen   Hypotension: continue on comfort care only.   Acute diastolic heart failure: continue on comfort care only  Acute metabolic encephalopathy: etiology unclear. Re-orient prn  Dysphagia: continue on dysphagia diet   Hypothyroidism: continue w/ comfort care only    DVT prophylaxis: comfort care Code Status: DNR/DNI Family Communication: discussed w/ pt's son who is at bedside  Disposition Plan:   Consultants:  ICU  Procedures:    Antimicrobials:   Subjective: Pt is obtunded. Unable to answer questions   Objective: Vitals:   12/05/19 2300 12/06/19 0000 12/06/19 0300 12/06/19 0705  BP:    (!) 126/101  Pulse: (!) 107 (!) 102 (!) 107 (!) 105  Resp: 10 10 12 15   Temp: 98 F (36.7 C)     TempSrc: Axillary     SpO2: 96% 96% (!) 83% 93%  Weight:      Height:        Intake/Output Summary (Last 24 hours) at 12/06/2019 0856 Last data filed at 12/06/2019 0000 Gross per 24 hour  Intake 711.97 ml  Output 100 ml  Net 611.97 ml   Filed Weights   11/24/2019 1207 11/28/19 0600 12/03/19 0629  Weight: 97.3 kg 99.3 kg 130.6  kg    Examination:  General exam: Appears obtunded  Respiratory system: diminished breath sounds b/l  Cardiovascular system: S1 & S2 +. No rubs, gallops or clicks. Gastrointestinal system: Abdomen is obese, soft and nontender.  hypoactive bowel sounds heard. Central nervous system: obtunded  Psychiatry: Judgement and insight appear abnormal.      Data Reviewed: I have personally reviewed following labs and imaging studies  CBC: Recent Labs  Lab 11/30/19 0344 12/01/19 0534 12/02/19 1446 12/04/19 0407 12/05/19 1010  WBC 12.2* 10.1 15.1* 13.9* 17.5*  NEUTROABS 7.4 5.7  --   --   --   HGB 9.5* 10.2* 9.8* 9.5* 9.6*  HCT 29.3* 31.2* 29.2* 29.3* 28.6*  MCV 94.2 93.4 91.3 92.4 91.4  PLT 200 191 268 257 249   Basic Metabolic Panel: Recent Labs  Lab 11/30/19 0344 11/30/19 0344 12/01/19 0534 12/02/19 1446 12/03/19 0516 12/04/19 0407 12/05/19 0503  NA 143  --  142 141  --  143 146*  K 4.2  --  3.9 3.6  --  3.3* 4.2  CL 111  --  109 106  --  105 106  CO2 26  --  26 27  --  30 28  GLUCOSE 99  --  77 100*  --  92 80  BUN <5*  --  <5* 6*  --  7* 9  CREATININE  0.49   < > 0.49 0.55 0.56 0.61 0.64  CALCIUM 8.0*  --  8.0* 8.0*  --  7.8* 7.6*  MG  --   --   --   --   --   --  1.8   < > = values in this interval not displayed.   GFR: Estimated Creatinine Clearance: 93.6 mL/min (by C-G formula based on SCr of 0.64 mg/dL). Liver Function Tests: Recent Labs  Lab 11/30/19 0344 12/01/19 0534  AST 86* 66*  ALT 41 38  ALKPHOS 128* 119  BILITOT 2.7* 2.0*  PROT 5.0* 4.9*  ALBUMIN 2.2* 2.1*   No results for input(s): LIPASE, AMYLASE in the last 168 hours. No results for input(s): AMMONIA in the last 168 hours. Coagulation Profile: No results for input(s): INR, PROTIME in the last 168 hours. Cardiac Enzymes: No results for input(s): CKTOTAL, CKMB, CKMBINDEX, TROPONINI in the last 168 hours. BNP (last 3 results) No results for input(s): PROBNP in the last 8760  hours. HbA1C: No results for input(s): HGBA1C in the last 72 hours. CBG: Recent Labs  Lab 12/04/19 1237 12/04/19 1717 12/04/19 2213 12/05/19 0733 12/05/19 1134  GLUCAP 94 93 95 91 78   Lipid Profile: No results for input(s): CHOL, HDL, LDLCALC, TRIG, CHOLHDL, LDLDIRECT in the last 72 hours. Thyroid Function Tests: No results for input(s): TSH, T4TOTAL, FREET4, T3FREE, THYROIDAB in the last 72 hours. Anemia Panel: No results for input(s): VITAMINB12, FOLATE, FERRITIN, TIBC, IRON, RETICCTPCT in the last 72 hours. Sepsis Labs: Recent Labs  Lab 12/05/19 1340  LATICACIDVEN 2.7*    Recent Results (from the past 240 hour(s))  C Difficile Quick Screen w PCR reflex     Status: None   Collection Time: 11/27/19  6:18 AM   Specimen: STOOL  Result Value Ref Range Status   C Diff antigen NEGATIVE NEGATIVE Final   C Diff toxin NEGATIVE NEGATIVE Final   C Diff interpretation No C. difficile detected.  Final    Comment: Performed at [facility], [address] [place], [place] [address]  MRSA PCR Screening     Status: None   Collection Time: 11/27/19 11:30 PM   Specimen: Nasal Mucosa; Nasopharyngeal  Result Value Ref Range Status   MRSA by PCR NEGATIVE NEGATIVE Final    Comment:        The GeneXpert MRSA Assay (FDA approved for NASAL specimens only), is one component of a comprehensive MRSA colonization surveillance program. It is not intended to diagnose MRSA infection nor to guide or monitor treatment for MRSA infections. Performed at [facility], [address] [place], [place] [address]   CULTURE, BLOOD (ROUTINE X 2) w Reflex to ID Panel     Status: None   Collection Time: 11/28/19 11:27 AM   Specimen: BLOOD  Result Value Ref Range Status   Specimen Description BLOOD LEFT ARM  Final   Special Requests   Final    BOTTLES DRAWN AEROBIC AND ANAEROBIC Blood Culture adequate volume   Culture   Final    NO GROWTH 5 DAYS Performed at [facility], [address] [place], [place] [address]    Report Status 12/03/2019 FINAL  Final  CULTURE, BLOOD (ROUTINE X 2) w Reflex to ID Panel     Status: None   Collection Time: 11/28/19 11:49 AM   Specimen: BLOOD  Result Value Ref Range Status   Specimen Description BLOOD RIGHT ARM  Final   Special Requests   Final  BOTTLES DRAWN AEROBIC AND ANAEROBIC Blood Culture adequate volume   Culture   Final    NO GROWTH 5 DAYS Performed at [facility], [address] [place], [place] [address]    Report Status 12/03/2019 FINAL  Final  CULTURE, BLOOD (ROUTINE X 2) w Reflex to ID Panel     Status: None (Preliminary result)   Collection Time: 12/05/19 10:10 AM   Specimen: BLOOD  Result Value Ref Range Status   Specimen Description BLOOD LEFT UPPER ARM  Final   Special Requests   Final    BOTTLES DRAWN AEROBIC AND ANAEROBIC Blood Culture adequate volume   Culture   Final    NO GROWTH < 24 HOURS Performed at [facility], [address], [place], [place] [address]    Report Status PENDING  Incomplete  CULTURE, BLOOD (ROUTINE X 2) w Reflex to ID Panel     Status: None (Preliminary result)   Collection Time: 12/05/19 10:17 AM   Specimen: BLOOD  Result Value Ref Range Status   Specimen Description BLOOD RIGHT HAND  Final   Special Requests   Final    BOTTLES DRAWN AEROBIC ONLY Blood Culture results may not be optimal due to an inadequate volume of blood received in culture bottles   Culture   Final    NO GROWTH < 24 HOURS Performed at [facility], [address] [place], [place] [address]    Report Status PENDING  Incomplete         Radiology Studies: CT HEAD WO CONTRAST  Result Date: 12/05/2019 CLINICAL DATA:  Left OG. EXAM: CT HEAD WITHOUT CONTRAST TECHNIQUE: Contiguous axial images were obtained from the base of the skull through the vertex without intravenous contrast. COMPARISON:  CT head dated December 11, 2019. FINDINGS: Brain: No evidence of  acute infarction, hemorrhage, hydrocephalus, extra-axial collection or mass lesion/mass effect. Stable mild atrophy and chronic microvascular ischemic changes. Vascular: Calcified atherosclerosis at the skullbase. No hyperdense vessel. Skull: Normal. Negative for fracture or focal lesion. Sinuses/Orbits: No acute finding.  New trace right mastoid effusion. Other: None. IMPRESSION: 1. No acute intracranial abnormality. Electronically Signed   By: Obie Dredge M.D.   On: 12/05/2019 11:03   DG Chest Port 1 View  Result Date: 12/05/2019 CLINICAL DATA:  Cough. EXAM: PORTABLE CHEST 1 VIEW COMPARISON:  December 02, 2019. FINDINGS: Stable cardiomegaly. No pneumothorax is noted. Left lung is clear. Mild right basilar atelectasis is noted with probable small right pleural effusion. Bony thorax is unremarkable. IMPRESSION: Mild right basilar atelectasis with probable small right pleural effusion. Electronically Signed   By: Lupita Raider M.D.   On: 12/05/2019 09:39        Scheduled Meds: . sodium hypochlorite   Irrigation BID   Continuous Infusions: . dextrose 20 mL/hr at 12/05/19 1558     LOS: 11 days    Time spent: 31 mins     Charise Killian, MD [facility] Pager [phone]  If 7PM-7AM, please contact night-coverage [email or web address] 12/06/2019, 8:56 AM

## 2019-12-06 NOTE — Progress Notes (Signed)
  Chaplain On-Call received pager notification from Nurse [place] requesting support for patient's family at bedside. On the Unit, Chaplain received update on patient's condition from Nurses Jessica [name] and Jessica [name].  Chaplain visited with patient's husband Jessica [name] and son Jessica [name]. Patient's status is comfort care, and her family stated their relief that the patient is not experiencing pain or suffering.  Patient's son is managing his anticipatory grief outwardly, stating many times that he fears he will be lost without the patient in his life.  Chaplain provided much spiritual and emotional support and prayer. Chaplain assured family of further availability as needed.  Chaplain Jessica [name] M.Div., [facility]

## 2019-12-06 NOTE — Progress Notes (Addendum)
PHARMACY - PHYSICIAN COMMUNICATION CRITICAL VALUE ALERT - BLOOD CULTURE IDENTIFICATION (BCID)  Jessica [name] is an 74 y.o. female who presented to [facility] on 12/11/2019 with a chief complaint of altered mental status on 8/14  Assessment:  Patient is comfort care.  8/24 blood culture with GPC 1 of 4 bottles.  BCID = Staphylococcal species  Name of physician (or Provider) Contacted: Dr Wilfred Lacy  Current antibiotics: none  Changes to prescribed antibiotics recommended:  none  Results for orders placed or performed during the hospital encounter of 12/01/2019  Blood Culture ID Panel (Reflexed) (Collected: 12/05/2019 10:17 AM)  Result Value Ref Range   Enterococcus faecalis NOT DETECTED NOT DETECTED   Enterococcus Faecium NOT DETECTED NOT DETECTED   Listeria monocytogenes NOT DETECTED NOT DETECTED   Staphylococcus species DETECTED (A) NOT DETECTED   Staphylococcus aureus (BCID) NOT DETECTED NOT DETECTED   Staphylococcus epidermidis NOT DETECTED NOT DETECTED   Staphylococcus lugdunensis NOT DETECTED NOT DETECTED   Streptococcus species NOT DETECTED NOT DETECTED   Streptococcus agalactiae NOT DETECTED NOT DETECTED   Streptococcus pneumoniae NOT DETECTED NOT DETECTED   Streptococcus pyogenes NOT DETECTED NOT DETECTED   A.calcoaceticus-baumannii NOT DETECTED NOT DETECTED   Bacteroides fragilis NOT DETECTED NOT DETECTED   Enterobacterales NOT DETECTED NOT DETECTED   Enterobacter cloacae complex NOT DETECTED NOT DETECTED   Escherichia coli NOT DETECTED NOT DETECTED   Klebsiella aerogenes NOT DETECTED NOT DETECTED   Klebsiella oxytoca NOT DETECTED NOT DETECTED   Klebsiella pneumoniae NOT DETECTED NOT DETECTED   Proteus species NOT DETECTED NOT DETECTED   Salmonella species NOT DETECTED NOT DETECTED   Serratia marcescens NOT DETECTED NOT DETECTED   Haemophilus influenzae NOT DETECTED NOT DETECTED   Neisseria meningitidis NOT DETECTED NOT DETECTED   Pseudomonas aeruginosa NOT  DETECTED NOT DETECTED   Stenotrophomonas maltophilia NOT DETECTED NOT DETECTED   Candida albicans NOT DETECTED NOT DETECTED   Candida auris NOT DETECTED NOT DETECTED   Candida glabrata NOT DETECTED NOT DETECTED   Candida krusei NOT DETECTED NOT DETECTED   Candida parapsilosis NOT DETECTED NOT DETECTED   Candida tropicalis NOT DETECTED NOT DETECTED   Cryptococcus neoformans/gattii NOT DETECTED NOT DETECTED    Juliette Alcide, PharmD, BCPS.   Work Cell: [phone] 12/06/2019 1:29 PM

## 2019-12-06 NOTE — Progress Notes (Signed)
Ch visited with Pt's family as follow-up from yesterday's visit. Ch provided pastoral presence and support to [name], and Pt's husband Rayna Sexton. Ch stayed for an extended period of time to support them during grief. Ch also escorted family from ICU to [facility] after Pt was moved. On-call Ch notified of status of Pt to follow up.

## 2019-12-06 NOTE — Progress Notes (Signed)
Pt unresponsive. CDS notified. Family at bedside. Emotional support provided. Morphine PRN for pain/ discomfort. Report called to 2c RN. Will be transported on ICU bed.

## 2019-12-07 NOTE — Progress Notes (Addendum)
Chaplain responded to a RN page on behalf of the Pt's family members. Chaplain provided emotional-spiritual care to Pt and family members by acknowledging and speaking to all three of them when entering the room.    Husband and Son introduced themselves. Pt did not respond. Chaplain provided comfort by inviting family members to talk about Pt. Chaplain learned that Father and Son had cared for Pt. in her home for the last eighteen years. Son asked Chaplain to pray with Pt and family. Family members did not speak to their faith tradition when invited by [place].   Son shed tears and spoke of his grief with regards to his mother. Father did not acknowledge his feelings, choosing to offer encouraging words to Son.   Chaplain and Son had a private conversation. Son spoke of life challenges and his purpose in life has been and is to care for his parents.   Offering encouraging words to Son for how he cares for his parents will be important in the days ahead. Inviting Husband to speak to his feelings will be worthwhile too.   Jamani Bearce B. Neva Seat Staff Chaplain- Relief

## 2019-12-07 NOTE — Care Management Important Message (Signed)
Important Message  Patient Details  Name: Jessica [name] MRN: [id number] Date of Birth: 19-Dec-1945   Medicare Important Message Given:  Other (see comment)  Patient on comfort care measures.  Medicare IM not given out of respect for patient and family.   Johnell Comings 12/07/2019, 1:33 PM

## 2019-12-07 NOTE — Progress Notes (Signed)
PROGRESS NOTE    Jessica [name]  DGU:[id number] DOB: [birth year] DOA: 2019/12/17 PCP: Annita Brod, MD   Assessment & Plan:   Active Problems:   Tachycardia   Decubital ulcer   Acute diastolic CHF (congestive heart failure) (HCC)   Acute on chronic respiratory failure with hypoxia (HCC)   Septic shock (HCC)   Atelectasis, right   Pleural effusion on right   Klebsiella sepsis (HCC)   Acute metabolic encephalopathy   Severe protein-calorie malnutrition (HCC)   Adult failure to thrive   Palliative care by specialist   DNR (do not resuscitate) discussion   Hypotension   Diarrhea   Nausea and vomiting   Weakness generalized   Lethargy  Multiorgan failure: continue w/ comfort care only   Adult failure to thrive: continue w/ comfort care only  Acute hypoxic respiratory failure: continue w/ comfort care only. Continue on supplemental oxygen   Hypotension: continue on comfort care only.   Acute diastolic heart failure: continue on comfort care only  Acute metabolic encephalopathy: etiology unclear. Re-orient prn  Dysphagia: continue on dysphagia diet   Hypothyroidism: continue w/ comfort care only    DVT prophylaxis: comfort care Code Status: DNR/DNI Family Communication: discussed w/ pt's family who is at bedside Disposition Plan: will likely pass away in the hospital   Consultants:  ICU  Procedures:    Antimicrobials:   Subjective: Pt is unresponsive again today.   Objective: Vitals:   12/06/19 0705 12/06/19 1036 12/06/19 1042 12/06/19 2155  BP: (!) 126/101 (!) 81/41  (!) 53/38  Pulse: (!) 105 (!) 108  (!) 109  Resp: 15 12  14   Temp:   97.7 F (36.5 C) 98.3 F (36.8 C)  TempSrc:   Axillary Oral  SpO2: 93% 97%  100%  Weight:      Height:       No intake or output data in the 24 hours ending 12/07/19 0721 Filed Weights   12/17/2019 1207 11/28/19 0600 12/03/19 0629  Weight: 97.3 kg 99.3 kg 130.6 kg    Examination:  General exam:  unresponsive  Respiratory system: decreased breath sounds b/l. Decreased RR  Cardiovascular system: S1 & S2 +. No rubs, gallops or clicks. Gastrointestinal system: Abdomen is obese, soft and nontender.  hypoactive bowel sounds heard. Central nervous system: unable to do a neuro exam secondary to pt being unresponsive Psychiatry: Judgement and insight appear abnormal.      Data Reviewed: I have personally reviewed following labs and imaging studies  CBC: Recent Labs  Lab 12/01/19 0534 12/02/19 1446 12/04/19 0407 12/05/19 1010  WBC 10.1 15.1* 13.9* 17.5*  NEUTROABS 5.7  --   --   --   HGB 10.2* 9.8* 9.5* 9.6*  HCT 31.2* 29.2* 29.3* 28.6*  MCV 93.4 91.3 92.4 91.4  PLT 191 268 257 249   Basic Metabolic Panel: Recent Labs  Lab 12/01/19 0534 12/02/19 1446 12/03/19 0516 12/04/19 0407 12/05/19 0503  NA 142 141  --  143 146*  K 3.9 3.6  --  3.3* 4.2  CL 109 106  --  105 106  CO2 26 27  --  30 28  GLUCOSE 77 100*  --  92 80  BUN <5* 6*  --  7* 9  CREATININE 0.49 0.55 0.56 0.61 0.64  CALCIUM 8.0* 8.0*  --  7.8* 7.6*  MG  --   --   --   --  1.8   GFR: Estimated Creatinine Clearance: 93.6 mL/min (by C-G formula based  on SCr of 0.64 mg/dL). Liver Function Tests: Recent Labs  Lab 12/01/19 0534  AST 66*  ALT 38  ALKPHOS 119  BILITOT 2.0*  PROT 4.9*  ALBUMIN 2.1*   No results for input(s): LIPASE, AMYLASE in the last 168 hours. No results for input(s): AMMONIA in the last 168 hours. Coagulation Profile: No results for input(s): INR, PROTIME in the last 168 hours. Cardiac Enzymes: No results for input(s): CKTOTAL, CKMB, CKMBINDEX, TROPONINI in the last 168 hours. BNP (last 3 results) No results for input(s): PROBNP in the last 8760 hours. HbA1C: No results for input(s): HGBA1C in the last 72 hours. CBG: Recent Labs  Lab 12/04/19 1237 12/04/19 1717 12/04/19 2213 12/05/19 0733 12/05/19 1134  GLUCAP 94 93 95 91 78   Lipid Profile: No results for input(s): CHOL,  HDL, LDLCALC, TRIG, CHOLHDL, LDLDIRECT in the last 72 hours. Thyroid Function Tests: No results for input(s): TSH, T4TOTAL, FREET4, T3FREE, THYROIDAB in the last 72 hours. Anemia Panel: No results for input(s): VITAMINB12, FOLATE, FERRITIN, TIBC, IRON, RETICCTPCT in the last 72 hours. Sepsis Labs: Recent Labs  Lab 12/05/19 1340  LATICACIDVEN 2.7*    Recent Results (from the past 240 hour(s))  MRSA PCR Screening     Status: None   Collection Time: 11/27/19 11:30 PM   Specimen: Nasal Mucosa; Nasopharyngeal  Result Value Ref Range Status   MRSA by PCR NEGATIVE NEGATIVE Final    Comment:        The GeneXpert MRSA Assay (FDA approved for NASAL specimens only), is one component of a comprehensive MRSA colonization surveillance program. It is not intended to diagnose MRSA infection nor to guide or monitor treatment for MRSA infections. Performed at [facility], [address] [place], [place] [address]   CULTURE, BLOOD (ROUTINE X 2) w Reflex to ID Panel     Status: None   Collection Time: 11/28/19 11:27 AM   Specimen: BLOOD  Result Value Ref Range Status   Specimen Description BLOOD LEFT ARM  Final   Special Requests   Final    BOTTLES DRAWN AEROBIC AND ANAEROBIC Blood Culture adequate volume   Culture   Final    NO GROWTH 5 DAYS Performed at [facility], [address] [place], [place] [address]    Report Status 12/03/2019 FINAL  Final  CULTURE, BLOOD (ROUTINE X 2) w Reflex to ID Panel     Status: None   Collection Time: 11/28/19 11:49 AM   Specimen: BLOOD  Result Value Ref Range Status   Specimen Description BLOOD RIGHT ARM  Final   Special Requests   Final    BOTTLES DRAWN AEROBIC AND ANAEROBIC Blood Culture adequate volume   Culture   Final    NO GROWTH 5 DAYS Performed at [facility], [address] [place], [place] [address]    Report Status 12/03/2019 FINAL  Final  CULTURE, BLOOD (ROUTINE X 2) w Reflex to ID Panel     Status:  None (Preliminary result)   Collection Time: 12/05/19 10:10 AM   Specimen: BLOOD  Result Value Ref Range Status   Specimen Description BLOOD LEFT UPPER ARM  Final   Special Requests   Final    BOTTLES DRAWN AEROBIC AND ANAEROBIC Blood Culture adequate volume   Culture   Final    NO GROWTH 2 DAYS Performed at [facility], [address] [place], [place] [address]    Report Status PENDING  Incomplete  CULTURE, BLOOD (ROUTINE X 2)  w Reflex to ID Panel     Status: None (Preliminary result)   Collection Time: 12/05/19 10:17 AM   Specimen: BLOOD  Result Value Ref Range Status   Specimen Description BLOOD RIGHT HAND  Final   Special Requests   Final    BOTTLES DRAWN AEROBIC ONLY Blood Culture results may not be optimal due to an inadequate volume of blood received in culture bottles   Culture  Setup Time   Final    Organism ID to follow GRAM POSITIVE COCCI AEROBIC BOTTLE ONLY CRITICAL RESULT CALLED TO, READ BACK BY AND VERIFIED WITH: KISHAN PATEL AT 1229 ON 12/06/2019 [facility]. Performed at [facility], [address] [place], [place] [address]    Culture [facility] POSITIVE COCCI  Final   Report Status PENDING  Incomplete  Blood Culture ID Panel (Reflexed)     Status: Abnormal   Collection Time: 12/05/19 10:17 AM  Result Value Ref Range Status   Enterococcus faecalis NOT DETECTED NOT DETECTED Final   Enterococcus Faecium NOT DETECTED NOT DETECTED Final   Listeria monocytogenes NOT DETECTED NOT DETECTED Final   Staphylococcus species DETECTED (A) NOT DETECTED Final    Comment: CRITICAL RESULT CALLED TO, READ BACK BY AND VERIFIED WITH: KISHAN PATEL AT 1229 ON 12/06/2019 [facility].    Staphylococcus aureus (BCID) NOT DETECTED NOT DETECTED Final   Staphylococcus epidermidis NOT DETECTED NOT DETECTED Final   Staphylococcus lugdunensis NOT DETECTED NOT DETECTED Final   Streptococcus species NOT DETECTED NOT DETECTED Final   Streptococcus agalactiae NOT DETECTED NOT DETECTED Final     Streptococcus pneumoniae NOT DETECTED NOT DETECTED Final   Streptococcus pyogenes NOT DETECTED NOT DETECTED Final   A.calcoaceticus-baumannii NOT DETECTED NOT DETECTED Final   Bacteroides fragilis NOT DETECTED NOT DETECTED Final   Enterobacterales NOT DETECTED NOT DETECTED Final   Enterobacter cloacae complex NOT DETECTED NOT DETECTED Final   Escherichia coli NOT DETECTED NOT DETECTED Final   Klebsiella aerogenes NOT DETECTED NOT DETECTED Final   Klebsiella oxytoca NOT DETECTED NOT DETECTED Final   Klebsiella pneumoniae NOT DETECTED NOT DETECTED Final   Proteus species NOT DETECTED NOT DETECTED Final   Salmonella species NOT DETECTED NOT DETECTED Final   Serratia marcescens NOT DETECTED NOT DETECTED Final   Haemophilus influenzae NOT DETECTED NOT DETECTED Final   Neisseria meningitidis NOT DETECTED NOT DETECTED Final   Pseudomonas aeruginosa NOT DETECTED NOT DETECTED Final   Stenotrophomonas maltophilia NOT DETECTED NOT DETECTED Final   Candida albicans NOT DETECTED NOT DETECTED Final   Candida auris NOT DETECTED NOT DETECTED Final   Candida glabrata NOT DETECTED NOT DETECTED Final   Candida krusei NOT DETECTED NOT DETECTED Final   Candida parapsilosis NOT DETECTED NOT DETECTED Final   Candida tropicalis NOT DETECTED NOT DETECTED Final   Cryptococcus neoformans/gattii NOT DETECTED NOT DETECTED Final    Comment: Performed at [facility], [address], [place], [place] [address]         Radiology Studies: CT HEAD WO CONTRAST  Result Date: 12/05/2019 CLINICAL DATA:  Left OG. EXAM: CT HEAD WITHOUT CONTRAST TECHNIQUE: Contiguous axial images were obtained from the base of the skull through the vertex without intravenous contrast. COMPARISON:  CT head dated November 25, 2019. FINDINGS: Brain: No evidence of acute infarction, hemorrhage, hydrocephalus, extra-axial collection or mass lesion/mass effect. Stable mild atrophy and chronic microvascular ischemic changes. Vascular:  Calcified atherosclerosis at the skullbase. No hyperdense vessel. Skull: Normal. Negative for fracture or focal lesion. Sinuses/Orbits: No acute finding.  New trace right mastoid effusion. Other: None. IMPRESSION: 1. No acute intracranial abnormality. Electronically Signed   By: Obie DredgeWilliam T Derry M.D.   On: 12/05/2019 11:03   DG Chest Port 1 View  Result Date: 12/05/2019 CLINICAL DATA:  Cough. EXAM: PORTABLE CHEST 1 VIEW COMPARISON:  December 02, 2019. FINDINGS: Stable cardiomegaly. No pneumothorax is noted. Left lung is clear. Mild right basilar atelectasis is noted with probable small right pleural effusion. Bony thorax is unremarkable. IMPRESSION: Mild right basilar atelectasis with probable small right pleural effusion. Electronically Signed   By: Lupita RaiderJames  Green Jr M.D.   On: 12/05/2019 09:39        Scheduled Meds: . sodium hypochlorite   Irrigation BID   Continuous Infusions: . dextrose 20 mL/hr at 12/05/19 1558     LOS: 12 days    Time spent: 15 mins     Charise KillianJamiese M Marte Celani, MD [facility] Pager [phone]  If 7PM-7AM, please contact night-coverage [email or web address] 12/07/2019, 7:21 AM

## 2019-12-07 NOTE — Progress Notes (Signed)
[place] visited pt.'s rm. per referral from [facility] and [facility], who have had extensive visits w/family and pt. this and prior admission.  Pt. on comfort care on 2C, lying in bed, w/husband Deidre Ala and son Jessica [name] at bedside; husband asleep but son awake --> when son saw [facility] he requested to talk somewhere private where husband wouldn't be disturbed.  [place] and Jessica [name] met in [place] for an extended visit in which Jessica [name] engaged in a thorough life review, beginning by describing his childhood experiences of feeling isolated and disconnected from his peers due to suffering from severe allergies that kept him home, and then continued alienation and disconnection resulting from bullying in middle school and high school.  Jessica [name] provided review of several significant romantic and professional relationships which left him feeling rejected, betrayed, and taken advantage of.  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  He considers it his last task in life to help take care of his father once his mother dies, but cannot see any reason to continue living beyond his father's eventual death. CH explored pt.'s sense that God is punishing him in the sickness of his mother; [facility] provided extended supportive listening and helped facilitate extended life review; [place] attempted to counter Jessica [name]'s perception of being fundamentally flawed in a way that prevents healthy relations with other people by affirming the good qualities CH perceived in him.  Jessica [name]  very grateful for the opportunity to be heard; shared that other chaplains' support has been a 'Godsend' but he needed to talk about himself tonight.  Chaplains remain aware of situation and will continue to support pt., staff, and family as needed.

## 2019-12-08 NOTE — Consult Note (Signed)
WOC Nurse Consult Note: In accordance with Surgery's input from their consult on 8/18 and family's request for topical treatment plans on 8/20, we will continue the Dakin's solution to the sacrum and buttock wound for an additional 3 days. This was the plan of care from the outpatient wound care center prior to admission. Pt is now with comfort care goals; if family wishes to discontinue topical treatment, then that plan of action would be appropriate.  Please re-consult if further assistance is needed.  Thank-you,  Cammie Mcgee MSN, RN, CWOCN, [place], CNS [phone]

## 2019-12-08 NOTE — Progress Notes (Signed)
   12/08/19 0940  Clinical Encounter Type  Visited With Patient and family together  Visit Type Follow-up  Referral From Nurse  Consult/Referral To Chaplain  Chaplain responded to page. Secretary said family requested visit from chaplain. Chaplain went in, husband and son were at bedside. Son is quite upset, but seem to to be processing pt's death more. He continues to say life is over when pt dies. Chaplain has broken to son over a week and know that there is not much she can say to change the way he is thinking.  Chaplain saw son and husband in the hall around 12:30 and son told chaplain that he wants to see a therapist. This is a step in the right direction because in the past, son said that therapy would not do him any good. He said he needs to talk to someone. He told chaplain it has to be someone close by and he is on Medicaid. Chaplain spoke with Cherre Huger in [facility] and he will give chaplain a list of places. Chaplain will inform son of this.

## 2019-12-08 NOTE — Progress Notes (Signed)
PROGRESS NOTE    Jessica [name]  CZY:[id number] DOB: July 24, 1945 DOA: 11/14/2019 PCP: Annita Brod, MD   Assessment & Plan:   Active Problems:   Tachycardia   Decubital ulcer   Acute diastolic CHF (congestive heart failure) (HCC)   Acute on chronic respiratory failure with hypoxia (HCC)   Septic shock (HCC)   Atelectasis, right   Pleural effusion on right   Klebsiella sepsis (HCC)   Acute metabolic encephalopathy   Severe protein-calorie malnutrition (HCC)   Adult failure to thrive   Palliative care by specialist   DNR (do not resuscitate) discussion   Hypotension   Diarrhea   Nausea and vomiting   Weakness generalized   Lethargy  Multiorgan failure: continue w/ comfort care only   Adult failure to thrive: continue w/ comfort care only  Acute hypoxic respiratory failure: continue w/ comfort care only. Continue on supplemental oxygen   Hypotension: continue on comfort care only.   Acute diastolic heart failure: continue on comfort care only  Acute metabolic encephalopathy: etiology unclear. Re-orient prn  Dysphagia: continue on dysphagia diet   Hypothyroidism: continue w/ comfort care only    DVT prophylaxis: comfort care Code Status: DNR/DNI Family Communication: discussed w/ pt's son at bedside  Disposition Plan: will likely pass away in the hospital   Consultants:  ICU  Procedures:    Antimicrobials:   Subjective: Pt is unable to answer questions as she is unresponsive   Objective: Vitals:   12/06/19 0705 12/06/19 1036 12/06/19 1042 12/06/19 2155  BP: (!) 126/101 (!) 81/41  (!) 53/38  Pulse: (!) 105 (!) 108  (!) 109  Resp: 15 12  14   Temp:   97.7 F (36.5 C) 98.3 F (36.8 C)  TempSrc:   Axillary Oral  SpO2: 93% 97%  100%  Weight:      Height:       No intake or output data in the 24 hours ending 12/08/19 0732 Filed Weights   11/20/2019 1207 11/28/19 0600 12/03/19 0629  Weight: 97.3 kg 99.3 kg 130.6 kg    Examination:  General  exam: Appears obtunded  Respiratory system: diminished breath sounds b/l.  Cardiovascular system: S1 & S2 +. No rubs, gallops or clicks. Gastrointestinal system: Abdomen is obese, soft and nontender.  hypoactive bowel sounds heard. Central nervous system: obtunded  Psychiatry: Judgement and insight appear abnormal.    Data Reviewed: I have personally reviewed following labs and imaging studies  CBC: Recent Labs  Lab 12/02/19 1446 12/04/19 0407 12/05/19 1010  WBC 15.1* 13.9* 17.5*  HGB 9.8* 9.5* 9.6*  HCT 29.2* 29.3* 28.6*  MCV 91.3 92.4 91.4  PLT 268 257 249   Basic Metabolic Panel: Recent Labs  Lab 12/02/19 1446 12/03/19 0516 12/04/19 0407 12/05/19 0503  NA 141  --  143 146*  K 3.6  --  3.3* 4.2  CL 106  --  105 106  CO2 27  --  30 28  GLUCOSE 100*  --  92 80  BUN 6*  --  7* 9  CREATININE 0.55 0.56 0.61 0.64  CALCIUM 8.0*  --  7.8* 7.6*  MG  --   --   --  1.8   GFR: Estimated Creatinine Clearance: 93.6 mL/min (by C-G formula based on SCr of 0.64 mg/dL). Liver Function Tests: No results for input(s): AST, ALT, ALKPHOS, BILITOT, PROT, ALBUMIN in the last 168 hours. No results for input(s): LIPASE, AMYLASE in the last 168 hours. No results for input(s): AMMONIA in  the last 168 hours. Coagulation Profile: No results for input(s): INR, PROTIME in the last 168 hours. Cardiac Enzymes: No results for input(s): CKTOTAL, CKMB, CKMBINDEX, TROPONINI in the last 168 hours. BNP (last 3 results) No results for input(s): PROBNP in the last 8760 hours. HbA1C: No results for input(s): HGBA1C in the last 72 hours. CBG: Recent Labs  Lab 12/04/19 1237 12/04/19 1717 12/04/19 2213 12/05/19 0733 12/05/19 1134  GLUCAP 94 93 95 91 78   Lipid Profile: No results for input(s): CHOL, HDL, LDLCALC, TRIG, CHOLHDL, LDLDIRECT in the last 72 hours. Thyroid Function Tests: No results for input(s): TSH, T4TOTAL, FREET4, T3FREE, THYROIDAB in the last 72 hours. Anemia Panel: No  results for input(s): VITAMINB12, FOLATE, FERRITIN, TIBC, IRON, RETICCTPCT in the last 72 hours. Sepsis Labs: Recent Labs  Lab 12/05/19 1340  LATICACIDVEN 2.7*    Recent Results (from the past 240 hour(s))  CULTURE, BLOOD (ROUTINE X 2) w Reflex to ID Panel     Status: None   Collection Time: 11/28/19 11:27 AM   Specimen: BLOOD  Result Value Ref Range Status   Specimen Description BLOOD LEFT ARM  Final   Special Requests   Final    BOTTLES DRAWN AEROBIC AND ANAEROBIC Blood Culture adequate volume   Culture   Final    NO GROWTH 5 DAYS Performed at [facility], [address] [place], [place] [address]    Report Status 12/03/2019 FINAL  Final  CULTURE, BLOOD (ROUTINE X 2) w Reflex to ID Panel     Status: None   Collection Time: 11/28/19 11:49 AM   Specimen: BLOOD  Result Value Ref Range Status   Specimen Description BLOOD RIGHT ARM  Final   Special Requests   Final    BOTTLES DRAWN AEROBIC AND ANAEROBIC Blood Culture adequate volume   Culture   Final    NO GROWTH 5 DAYS Performed at [facility], [address] [place], [place] [address]    Report Status 12/03/2019 FINAL  Final  CULTURE, BLOOD (ROUTINE X 2) w Reflex to ID Panel     Status: None (Preliminary result)   Collection Time: 12/05/19 10:10 AM   Specimen: BLOOD  Result Value Ref Range Status   Specimen Description BLOOD LEFT UPPER ARM  Final   Special Requests   Final    BOTTLES DRAWN AEROBIC AND ANAEROBIC Blood Culture adequate volume   Culture   Final    NO GROWTH 3 DAYS Performed at [facility], [address] [place], [place] [address]    Report Status PENDING  Incomplete  CULTURE, BLOOD (ROUTINE X 2) w Reflex to ID Panel     Status: None (Preliminary result)   Collection Time: 12/05/19 10:17 AM   Specimen: BLOOD  Result Value Ref Range Status   Specimen Description   Final    BLOOD RIGHT HAND Performed at [facility], [address] [place], [place]  [address]    Special Requests   Final    BOTTLES DRAWN AEROBIC ONLY Blood Culture results may not be optimal due to an inadequate volume of blood received in culture bottles Performed at [facility], [address] [place], [place] [address]    Culture  Setup Time   Final    Organism ID to follow GRAM POSITIVE COCCI AEROBIC BOTTLE ONLY CRITICAL RESULT CALLED TO, READ BACK BY AND VERIFIED WITH: KISHAN PATEL AT 1229 ON 12/06/2019 [facility]. Performed at [facility], [address]  [place], [place] [address]    Culture   Final    GRAM POSITIVE COCCI IDENTIFICATION TO FOLLOW Performed at [facility], [address], [place], [place] [address]    Report Status PENDING  Incomplete  Blood Culture ID Panel (Reflexed)     Status: Abnormal   Collection Time: 12/05/19 10:17 AM  Result Value Ref Range Status   Enterococcus faecalis NOT DETECTED NOT DETECTED Final   Enterococcus Faecium NOT DETECTED NOT DETECTED Final   Listeria monocytogenes NOT DETECTED NOT DETECTED Final   Staphylococcus species DETECTED (A) NOT DETECTED Final    Comment: CRITICAL RESULT CALLED TO, READ BACK BY AND VERIFIED WITH: KISHAN PATEL AT 1229 ON 12/06/2019 [facility].    Staphylococcus aureus (BCID) NOT DETECTED NOT DETECTED Final   Staphylococcus epidermidis NOT DETECTED NOT DETECTED Final   Staphylococcus lugdunensis NOT DETECTED NOT DETECTED Final   Streptococcus species NOT DETECTED NOT DETECTED Final   Streptococcus agalactiae NOT DETECTED NOT DETECTED Final   Streptococcus pneumoniae NOT DETECTED NOT DETECTED Final   Streptococcus pyogenes NOT DETECTED NOT DETECTED Final   A.calcoaceticus-baumannii NOT DETECTED NOT DETECTED Final   Bacteroides fragilis NOT DETECTED NOT DETECTED Final   Enterobacterales NOT DETECTED NOT DETECTED Final   Enterobacter cloacae complex NOT DETECTED NOT DETECTED Final   Escherichia coli NOT DETECTED NOT DETECTED Final   Klebsiella aerogenes NOT DETECTED NOT  DETECTED Final   Klebsiella oxytoca NOT DETECTED NOT DETECTED Final   Klebsiella pneumoniae NOT DETECTED NOT DETECTED Final   Proteus species NOT DETECTED NOT DETECTED Final   Salmonella species NOT DETECTED NOT DETECTED Final   Serratia marcescens NOT DETECTED NOT DETECTED Final   Haemophilus influenzae NOT DETECTED NOT DETECTED Final   Neisseria meningitidis NOT DETECTED NOT DETECTED Final   Pseudomonas aeruginosa NOT DETECTED NOT DETECTED Final   Stenotrophomonas maltophilia NOT DETECTED NOT DETECTED Final   Candida albicans NOT DETECTED NOT DETECTED Final   Candida auris NOT DETECTED NOT DETECTED Final   Candida glabrata NOT DETECTED NOT DETECTED Final   Candida krusei NOT DETECTED NOT DETECTED Final   Candida parapsilosis NOT DETECTED NOT DETECTED Final   Candida tropicalis NOT DETECTED NOT DETECTED Final   Cryptococcus neoformans/gattii NOT DETECTED NOT DETECTED Final    Comment: Performed at [facility], [address] [place], [place] [address]         Radiology Studies: No results found.      Scheduled Meds: . sodium hypochlorite   Irrigation BID   Continuous Infusions: . dextrose 20 mL/hr at 12/05/19 1558     LOS: 13 days    Time spent: 10 mins     Charise Killian, MD [facility] Pager [phone]  If 7PM-7AM, please contact night-coverage [email or web address] 12/08/2019, 7:32 AM

## 2019-12-10 LAB — CULTURE, BLOOD (ROUTINE X 2)
Culture: NO GROWTH
Special Requests: ADEQUATE

## 2019-12-13 NOTE — Death Summary Note (Addendum)
Death Summary  PROMYSE ARDITO URK:[id number] DOB: 03/16/1946 DOA: 12/01/19  PCP: Annita Brod, MD PCP/Office notified:   Admit date: 12/01/19 Date of Death: December 15, 2019  Final Diagnoses:  Active Problems:   Tachycardia   Decubital ulcer   Acute diastolic CHF (congestive heart failure) (HCC)   Acute on chronic respiratory failure with hypoxia (HCC)   Septic shock (HCC)   Atelectasis, right   Pleural effusion on right   Klebsiella sepsis (HCC)   Acute metabolic encephalopathy   Severe protein-calorie malnutrition (HCC)   Adult failure to thrive   Palliative care by specialist   DNR (do not resuscitate) discussion   Hypotension   Diarrhea   Nausea and vomiting   Weakness generalized   Lethargy      History of present illness:  HPI was taken from Dr.Samaan: 74 years old lady with history of multiple UTIs, sacral decub, sacral osteomyelitis, spinal stenosis, hypothyroidism, hypertension, diastolic heart failure, depression, chronic pain and visual hallucinations.  Patient presented to ED because of because of altered mental status and less responsiveness since the morning.  While in the ED she was found to have hypotension with systolic blood pressure low in 70s, improved after fluid resuscitation was however pressure continued to drop requiring titration of Levophed.  Reported by nursing exam earlier in the ED that the patient was awake answering question by nodding her head however by time of critical care evaluation the patient is completely unresponsive even to painful stimulation.  Case was discussed with the ED physician and he agreed to proceed with intubation.  I discussed Mrs Iverson condition with her son at the bedside and the requirement to proceed with intubation mechanical ventilation as she did not respond to physical stimulation.  Ms. Grose son was very emotional crying hysterically and we were able to provide any detailed medical history. Mr. Kinzie arrived  later, he was made aware of the patient critical condition and requirement to be on the ventilator.  CODE STATUS was verified to be full code.   Hospital Course:  Hospital course from Dr. Wilfred Lacy 8/25-08/03/2020: By the time I started taking care of the pt the pt was already comfort care only. Unfortunately pt passed away on December 15, 2019 at 1:40 AM.  Hx from Dr. Belia Heman: The Clinical status was relayed to family in detail. Son and Husband at Bedside Family is very emotional, I have explained that patient is in dying process, patient son is very tearful and aggressive nature.   Husband and [name] at Bedside, they have consented to DNR/DNI.  Updated and notified of patients medical condition.  Patient remains unresponsive and will not open eyes to command.  patient with increased WOB and using accessory muscles to breathe Explained to family course of therapy and the modalities   Patient with Progressive multiorgan failure with very low chance of meaningful recovery despite all aggressive and optimal medical therapy.  Patient is in the dying  Process associated with suffering.  Family understands the situation.  They have consented and agreed to DNR/DNI   Hx/ A&P from Dr. Renae Gloss:  1. Vomited twice this morning.  IV Zofran as needed. 2. Acute diastolic congestive heart failure change Lasix back to IV with the vomiting. 3. Acute hypoxic respiratory failure on oxygen.  Qualifies for home oxygen. 4. Hypotension on midodrine 10 mg 3 times a day. 5. Septic shock secondary to Klebsiella.  Rocephin switched over to Keflex for continuation of course for tomorrow. 6. 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  Continue local wound care. 7. Acute metabolic encephalopathy this was better this morning. 8. Small right pleural effusion. 9. Dysphagia.  Continue dysphagia 1 diet with nectar thick liquids.  High risk for aspiration. 10. Severe protein calorie malnutrition 11. Hypothyroidism  unspecified.  Continue levothyroxine 12. Increased Lyrica dose today secondary to arm pain 13. Left arm pain and tricep area improved with massage.   Time: 1:40 AM  Signed:  Charise Killian  [facility] 12/20/19, 2:57 PM

## 2019-12-13 NOTE — Progress Notes (Signed)
Pt expired at 0140. Husband and son at bedside. [facility], NP Manuela Schwartz, [employer], and funeral home notified.

## 2019-12-13 NOTE — Progress Notes (Signed)
Responded to PG from nurse. Family was upset about Pt situation. Family asked me to pray for Pt. I prayed for family and Pt. Ch will follow-up.

## 2019-12-13 DEATH — deceased
